# Patient Record
Sex: Male | Born: 1960
Health system: Southern US, Community
[De-identification: ages and names within clinical notes are randomized; demographics above are authoritative.]

## PROBLEM LIST (undated history)

## (undated) DIAGNOSIS — G629 Polyneuropathy, unspecified: Secondary | ICD-10-CM

## (undated) DIAGNOSIS — K219 Gastro-esophageal reflux disease without esophagitis: Secondary | ICD-10-CM

## (undated) DIAGNOSIS — E119 Type 2 diabetes mellitus without complications: Secondary | ICD-10-CM

## (undated) DIAGNOSIS — B1920 Unspecified viral hepatitis C without hepatic coma: Secondary | ICD-10-CM

## (undated) DIAGNOSIS — J40 Bronchitis, not specified as acute or chronic: Secondary | ICD-10-CM

## (undated) DIAGNOSIS — F32A Depression, unspecified: Secondary | ICD-10-CM

## (undated) DIAGNOSIS — F329 Major depressive disorder, single episode, unspecified: Secondary | ICD-10-CM

## (undated) DIAGNOSIS — K2270 Barrett's esophagus without dysplasia: Secondary | ICD-10-CM

## (undated) DIAGNOSIS — N529 Male erectile dysfunction, unspecified: Secondary | ICD-10-CM

## (undated) DIAGNOSIS — K449 Diaphragmatic hernia without obstruction or gangrene: Secondary | ICD-10-CM

## (undated) HISTORY — PX: APPENDECTOMY: SHX54

## (undated) HISTORY — DX: Polyneuropathy, unspecified: G62.9

## (undated) HISTORY — DX: Unspecified viral hepatitis C without hepatic coma: B19.20

## (undated) HISTORY — PX: TONSILLECTOMY: SUR1361

## (undated) HISTORY — DX: Barrett's esophagus without dysplasia: K22.70

## (undated) HISTORY — DX: Male erectile dysfunction, unspecified: N52.9

## (undated) HISTORY — DX: Gastro-esophageal reflux disease without esophagitis: K21.9

## (undated) HISTORY — DX: Bronchitis, not specified as acute or chronic: J40

## (undated) HISTORY — PX: COLONOSCOPY: SHX174

## (undated) HISTORY — DX: Major depressive disorder, single episode, unspecified: F32.9

## (undated) HISTORY — DX: Type 2 diabetes mellitus without complications: E11.9

## (undated) HISTORY — DX: Depression, unspecified: F32.A

## (undated) HISTORY — DX: Diaphragmatic hernia without obstruction or gangrene: K44.9

---

## 2001-05-05 ENCOUNTER — Ambulatory Visit (HOSPITAL_COMMUNITY): Admission: RE | Admit: 2001-05-05 | Discharge: 2001-05-05 | Payer: Self-pay | Admitting: Pulmonary Disease

## 2001-05-05 ENCOUNTER — Encounter: Payer: Self-pay | Admitting: Pulmonary Disease

## 2001-07-04 ENCOUNTER — Emergency Department (HOSPITAL_COMMUNITY): Admission: EM | Admit: 2001-07-04 | Discharge: 2001-07-05 | Payer: Self-pay | Admitting: Emergency Medicine

## 2002-04-14 ENCOUNTER — Emergency Department (HOSPITAL_COMMUNITY): Admission: EM | Admit: 2002-04-14 | Discharge: 2002-04-14 | Payer: Self-pay | Admitting: Emergency Medicine

## 2002-11-13 ENCOUNTER — Inpatient Hospital Stay (HOSPITAL_COMMUNITY): Admission: EM | Admit: 2002-11-13 | Discharge: 2002-11-16 | Payer: Self-pay | Admitting: Psychiatry

## 2006-07-17 ENCOUNTER — Emergency Department (HOSPITAL_COMMUNITY): Admission: EM | Admit: 2006-07-17 | Discharge: 2006-07-17 | Payer: Self-pay | Admitting: Emergency Medicine

## 2013-04-25 ENCOUNTER — Encounter: Payer: Self-pay | Admitting: Internal Medicine

## 2013-04-25 ENCOUNTER — Ambulatory Visit: Payer: Self-pay | Attending: Family Medicine | Admitting: Internal Medicine

## 2013-04-25 ENCOUNTER — Telehealth: Payer: Self-pay | Admitting: Internal Medicine

## 2013-04-25 VITALS — BP 154/96 | HR 65 | Temp 98.6°F | Ht 72.0 in | Wt 178.2 lb

## 2013-04-25 DIAGNOSIS — R109 Unspecified abdominal pain: Secondary | ICD-10-CM

## 2013-04-25 DIAGNOSIS — F17200 Nicotine dependence, unspecified, uncomplicated: Secondary | ICD-10-CM

## 2013-04-25 DIAGNOSIS — F1010 Alcohol abuse, uncomplicated: Secondary | ICD-10-CM

## 2013-04-25 DIAGNOSIS — R112 Nausea with vomiting, unspecified: Secondary | ICD-10-CM

## 2013-04-25 LAB — CBC WITH DIFFERENTIAL/PLATELET
Basophils Absolute: 0.1 10*3/uL (ref 0.0–0.1)
Basophils Relative: 1 % (ref 0–1)
Eosinophils Absolute: 0.4 10*3/uL (ref 0.0–0.7)
Eosinophils Relative: 3 % (ref 0–5)
HCT: 49.3 % (ref 39.0–52.0)
Hemoglobin: 17.2 g/dL — ABNORMAL HIGH (ref 13.0–17.0)
Lymphocytes Relative: 33 % (ref 12–46)
Lymphs Abs: 3.6 10*3/uL (ref 0.7–4.0)
MCH: 31.7 pg (ref 26.0–34.0)
MCHC: 34.9 g/dL (ref 30.0–36.0)
MCV: 90.8 fL (ref 78.0–100.0)
Monocytes Absolute: 1.1 10*3/uL — ABNORMAL HIGH (ref 0.1–1.0)
Monocytes Relative: 10 % (ref 3–12)
Neutro Abs: 5.7 10*3/uL (ref 1.7–7.7)
Neutrophils Relative %: 53 % (ref 43–77)
Platelets: 207 10*3/uL (ref 150–400)
RBC: 5.43 MIL/uL (ref 4.22–5.81)
RDW: 13.4 % (ref 11.5–15.5)
WBC: 10.8 10*3/uL — ABNORMAL HIGH (ref 4.0–10.5)

## 2013-04-25 MED ORDER — DICYCLOMINE HCL 20 MG PO TABS: 20.0000 mg | ORAL_TABLET | Freq: Four times a day (QID) | ORAL | Status: DC

## 2013-04-25 MED ORDER — ONDANSETRON HCL 4 MG PO TABS: 4.0000 mg | ORAL_TABLET | Freq: Three times a day (TID) | ORAL | Status: DC | PRN

## 2013-04-25 NOTE — Telephone Encounter (Signed)
Test note.

## 2013-04-25 NOTE — Progress Notes (Signed)
Patient ID: GURLEY CLIMER, male   DOB: [birth year], 52 y.o.   MRN: [id number]  CC: To establish care  HPI: Mr. Eckenrode is a 52 years old man here today to establish medical care. He has no significant past medical history except that he lost his job recently and because of financial distress he has been depressed. In about 2 months ago he saw a psychiatrist who prescribed a medication for depression, and since then he has been noticing frequent nausea and vomiting, and abdominal cramps as well as muscle cramps. He called his psychiatrist, he was reassured that he will be better and to continue the medication. He still has nausea/vomiting, mostly after eating and he has to relieve himself by vomiting. Vomiting as of recently ingested food or fluid, no blood or bile. He also feels some generalized cramping in the abdomen, no diarrhea or constipation. No abdominal distention. No urinary symptoms. He drinks alcohol every day, sometimes heavy. He has had 4 DUIs and is no longer eligible to driver's license as a result he cannot get a job. He continued to smoke cigarette about one pack per day sometimes half a pack.  No Known Allergies No past medical history on file. No current outpatient prescriptions on file prior to visit.   No current facility-administered medications on file prior to visit.   Family History  Problem Relation Age of Onset  . Cancer Mother   . Diabetes Mother    History   Social History  . Marital Status: Single    Spouse Name: N/A    Number of Children: N/A  . Years of Education: N/A   Occupational History  . Not on file.   Social History Main Topics  . Smoking status: Current Every Day Smoker -- 0.50 packs/day for 35 years  . Smokeless tobacco: Not on file  . Alcohol Use: Not on file  . Drug Use: Not on file  . Sexual Activity: Not on file   Other Topics Concern  . Not on file   Social History Narrative  . No narrative on file    Review of  Systems: Constitutional: Negative for fever, chills, diaphoresis, activity change, appetite change and fatigue. HENT: Negative for ear pain, nosebleeds, congestion, facial swelling, rhinorrhea, neck pain, neck stiffness and ear discharge.  Eyes: Negative for pain, discharge, redness, itching and visual disturbance. Respiratory: Negative for cough, choking, chest tightness, shortness of breath, wheezing and stridor.  Cardiovascular: Negative for chest pain, palpitations and leg swelling. Gastrointestinal: Negative for abdominal distention. Genitourinary: Negative for dysuria, urgency, frequency, hematuria, flank pain, decreased urine volume, difficulty urinating and dyspareunia.  Musculoskeletal: Negative for back pain, joint swelling, arthralgias and gait problem. Neurological: Negative for dizziness, tremors, seizures, syncope, facial asymmetry, speech difficulty, weakness, light-headedness, numbness and headaches.  Hematological: Negative for adenopathy. Does not bruise/bleed easily. Psychiatric/Behavioral: Negative for hallucinations, behavioral problems, confusion, dysphoric mood, decreased concentration and agitation.    Objective:   Filed Vitals:   04/25/13 1425  BP: 154/96  Pulse: 65  Temp: 98.6 F (37 C)    Physical Exam: Constitutional: Patient appears well-developed and well-nourished. No distress. HENT: Normocephalic, atraumatic, External right and left ear normal. Oropharynx is clear and moist.  Eyes: Conjunctivae and EOM are normal. PERRLA, no scleral icterus. Neck: Normal ROM. Neck supple. No JVD. No tracheal deviation. No thyromegaly. CVS: RRR, S1/S2 +, no murmurs, no gallops, no carotid bruit.  Pulmonary: Effort and breath sounds normal, no stridor, rhonchi, wheezes, rales.  Abdominal: Soft. BS +,  no distension, tenderness, rebound or guarding.  Musculoskeletal: Normal range of motion. No edema and no tenderness.  Lymphadenopathy: No lymphadenopathy noted, cervical,  inguinal or axillary Neuro: Alert. Normal reflexes, muscle tone coordination. No cranial nerve deficit. Skin: Skin is warm and dry. No rash noted. Not diaphoretic. No erythema. No pallor. Psychiatric: Normal mood and affect. Behavior, judgment, thought content normal.  No results found for this basename: WBC, HGB, HCT, MCV, PLT   No results found for this basename: CREATININE, BUN, NA, K, CL, CO2    No results found for this basename: HGBA1C   Lipid Panel  No results found for this basename: chol, trig, hdl, cholhdl, vldl, ldlcalc       Assessment and plan:   There are no active problems to display for this patient.  Dicyclomine 20 mg tablet by mouth q. 8 when necessary abdominal cramp Ondansetron 4 mg tablet by mouth every 6 hours when necessary nausea or vomiting  Patient extensively counseled to reduce alcohol consumption Patient counseled about smoking cessation  Labs: CBC CMP Complete urinalysis Lipid profile Amylase and lipase Thyroid function test  Patient will need to be scheduled for colonoscopy  Stasia Cavalier was given clear instructions to go to ER or return to the clinic if symptoms don't improve, worsen or new problems develop.  Lynnae January Mccorkle verbalized understanding.  Stasia Cavalier was told to call to get lab results if hasn't heard anything in the next week.         Jeanann Lewandowsky, MD [facility] [name], [place] [phone]   04/25/2013, 2:48 PM

## 2013-04-26 LAB — CMP AND LIVER
ALT: 68 U/L — ABNORMAL HIGH (ref 0–53)
AST: 36 U/L (ref 0–37)
Albumin: 4.2 g/dL (ref 3.5–5.2)
Alkaline Phosphatase: 78 U/L (ref 39–117)
BUN: 14 mg/dL (ref 6–23)
Bilirubin, Direct: 0.1 mg/dL (ref 0.0–0.3)
CO2: 27 mEq/L (ref 19–32)
Calcium: 9.3 mg/dL (ref 8.4–10.5)
Chloride: 99 mEq/L (ref 96–112)
Creat: 0.94 mg/dL (ref 0.50–1.35)
Glucose, Bld: 335 mg/dL — ABNORMAL HIGH (ref 70–99)
Indirect Bilirubin: 0.5 mg/dL (ref 0.0–0.9)
Potassium: 4.2 mEq/L (ref 3.5–5.3)
Sodium: 135 mEq/L (ref 135–145)
Total Bilirubin: 0.6 mg/dL (ref 0.3–1.2)
Total Protein: 7.7 g/dL (ref 6.0–8.3)

## 2013-04-26 LAB — URINALYSIS, COMPLETE
Bacteria, UA: NONE SEEN
Bilirubin Urine: NEGATIVE
Casts: NONE SEEN
Crystals: NONE SEEN
Glucose, UA: >1000 mg/dL — AB
Hgb urine dipstick: NEGATIVE
Ketones, ur: NEGATIVE mg/dL
Leukocytes, UA: NEGATIVE
Nitrite: NEGATIVE
Protein, ur: NEGATIVE mg/dL
Specific Gravity, Urine: >1.03 — ABNORMAL HIGH (ref 1.005–1.030)
Squamous Epithelial / HPF: NONE SEEN
Urobilinogen, UA: 0.2 mg/dL (ref 0.0–1.0)
pH: 5 (ref 5.0–8.0)

## 2013-04-26 LAB — LIPID PANEL
Cholesterol: 183 mg/dL (ref 0–200)
HDL: 33 mg/dL — ABNORMAL LOW (ref >39)
LDL Cholesterol: 88 mg/dL (ref 0–99)
Total CHOL/HDL Ratio: 5.5 Ratio
Triglycerides: 309 mg/dL — ABNORMAL HIGH (ref <150)
VLDL: 62 mg/dL — ABNORMAL HIGH (ref 0–40)

## 2013-04-26 LAB — LIPASE: Lipase: 69 U/L (ref 0–75)

## 2013-04-26 LAB — AMYLASE: Amylase: 54 U/L (ref 0–105)

## 2013-04-27 ENCOUNTER — Ambulatory Visit: Payer: Self-pay | Attending: Internal Medicine | Admitting: Internal Medicine

## 2013-04-27 VITALS — BP 151/90 | HR 87 | Temp 98.4°F | Resp 16 | Wt 180.0 lb

## 2013-04-27 DIAGNOSIS — E119 Type 2 diabetes mellitus without complications: Secondary | ICD-10-CM | POA: Insufficient documentation

## 2013-04-27 LAB — POCT GLYCOSYLATED HEMOGLOBIN (HGB A1C): Hemoglobin A1C: >=14

## 2013-04-27 MED ORDER — METFORMIN HCL 500 MG PO TABS: 500.0000 mg | ORAL_TABLET | Freq: Two times a day (BID) | ORAL | Status: DC

## 2013-04-27 NOTE — Progress Notes (Signed)
Patient here for lab results.

## 2013-04-27 NOTE — Progress Notes (Signed)
Patient ID: AERIK POLAN, male   DOB: 08-23-1961, 52 y.o.   MRN: [id number]  CC: Lab review  HPI: Mr. Haggart was called in to clinic today to review his lab results. Hypertriglyceridemia, and diabetes. He drinks about pain bottles of soda every day, and said lately he has been very thirsty, and has to drink even more. He also eats more than usual. Results of lab shows glycosuria, but no proteinuria.   No Known Allergies History reviewed. No pertinent past medical history. Current Outpatient Prescriptions on File Prior to Visit  Medication Sig Dispense Refill  . dicyclomine (BENTYL) 20 MG tablet Take 1 tablet (20 mg total) by mouth every 6 (six) hours.  30 tablet  0  . ondansetron (ZOFRAN) 4 MG tablet Take 1 tablet (4 mg total) by mouth every 8 (eight) hours as needed for nausea.  20 tablet  0   No current facility-administered medications on file prior to visit.   Family History  Problem Relation Age of Onset  . Cancer Mother   . Diabetes Mother    History   Social History  . Marital Status: Single    Spouse Name: N/A    Number of Children: N/A  . Years of Education: N/A   Occupational History  . Not on file.   Social History Main Topics  . Smoking status: Current Every Day Smoker -- 0.50 packs/day for 35 years  . Smokeless tobacco: Not on file  . Alcohol Use: Not on file  . Drug Use: Not on file  . Sexual Activity: Not on file   Other Topics Concern  . Not on file   Social History Narrative  . No narrative on file    Review of Systems: Constitutional: Negative for fever, chills, diaphoresis, activity change, appetite change and fatigue. HENT: Negative for ear pain, nosebleeds, congestion, facial swelling, rhinorrhea, neck pain, neck stiffness and ear discharge.  Eyes: Negative for pain, discharge, redness, itching and visual disturbance. Respiratory: Negative for cough, choking, chest tightness, shortness of breath, wheezing and stridor.  Cardiovascular: Negative  for chest pain, palpitations and leg swelling. Gastrointestinal: Negative for abdominal distention. Genitourinary: Negative for dysuria, urgency, frequency, hematuria, flank pain, decreased urine volume, difficulty urinating and dyspareunia.  Musculoskeletal: Negative for back pain, joint swelling, arthralgias and gait problem. Neurological: Negative for dizziness, tremors, seizures, syncope, facial asymmetry, speech difficulty, weakness, light-headedness, numbness and headaches.  Hematological: Negative for adenopathy. Does not bruise/bleed easily. Psychiatric/Behavioral: Negative for hallucinations, behavioral problems, confusion, dysphoric mood, decreased concentration and agitation.    Objective:   Filed Vitals:   04/27/13 1533  BP: 151/90  Pulse: 87  Temp: 98.4 F (36.9 C)  Resp: 16    Physical Exam: Constitutional: Patient appears well-developed and well-nourished. No distress. HENT: Normocephalic, atraumatic, External right and left ear normal. Oropharynx is clear and moist.  Eyes: Conjunctivae and EOM are normal. PERRLA, no scleral icterus. Neck: Normal ROM. Neck supple. No JVD. No tracheal deviation. No thyromegaly. CVS: RRR, S1/S2 +, no murmurs, no gallops, no carotid bruit.  Pulmonary: Effort and breath sounds normal, no stridor, rhonchi, wheezes, rales.  Abdominal: Soft. BS +,  no distension, tenderness, rebound or guarding.  Musculoskeletal: Normal range of motion. No edema and no tenderness.  Lymphadenopathy: No lymphadenopathy noted, cervical, inguinal or axillary Neuro: Alert. Normal reflexes, muscle tone coordination. No cranial nerve deficit. Skin: Skin is warm and dry. No rash noted. Not diaphoretic. No erythema. No pallor. Psychiatric: Normal mood and affect. Behavior, judgment, thought content  normal.  Lab Results  Component Value Date   WBC 10.8* 04/25/2013   HGB 17.2* 04/25/2013   HCT 49.3 04/25/2013   MCV 90.8 04/25/2013   PLT 207 04/25/2013   Lab Results   Component Value Date   CREATININE 0.94 04/25/2013   BUN 14 04/25/2013   NA 135 04/25/2013   K 4.2 04/25/2013   CL 99 04/25/2013   CO2 27 04/25/2013    No results found for this basename: HGBA1C   Lipid Panel     Component Value Date/Time   CHOL 183 04/25/2013 1446   TRIG 309* 04/25/2013 1446   HDL 33* 04/25/2013 1446   CHOLHDL 5.5 04/25/2013 1446   VLDL 62* 04/25/2013 1446   LDLCALC 88 04/25/2013 1446       Assessment and plan:   Patient Active Problem List   Diagnosis Date Noted  . Diabetes 04/27/2013  Will check hemoglobin A1c today Metformin 500 mg tablet by mouth twice a day prescribed  Patient was extensively counseled about diabetes, hypoglycemic precaution, and diet and exercise Return in 2 weeks for blood sugar check and blood pressure check  Stasia Cavalier was given clear instructions to go to ER or return to the clinic if symptoms don't improve, worsen or new problems develop.  Lynnae January Zelenak verbalized understanding.  Stasia Cavalier was told to call to get lab results if hasn't heard anything in the next week.        Jeanann Lewandowsky, MD [facility] [place], [place] [phone]   04/27/2013, 4:06 PM

## 2013-04-28 ENCOUNTER — Encounter: Payer: Self-pay | Admitting: Internal Medicine

## 2013-05-03 ENCOUNTER — Ambulatory Visit: Payer: Self-pay

## 2013-05-26 ENCOUNTER — Ambulatory Visit: Payer: Self-pay | Attending: Internal Medicine | Admitting: Internal Medicine

## 2013-05-26 VITALS — BP 159/98 | HR 76 | Temp 98.1°F | Ht 72.0 in | Wt 181.6 lb

## 2013-05-26 DIAGNOSIS — I10 Essential (primary) hypertension: Secondary | ICD-10-CM | POA: Insufficient documentation

## 2013-05-26 DIAGNOSIS — E119 Type 2 diabetes mellitus without complications: Secondary | ICD-10-CM

## 2013-05-26 LAB — COMPREHENSIVE METABOLIC PANEL
ALT: 84 U/L — ABNORMAL HIGH (ref 0–53)
AST: 57 U/L — ABNORMAL HIGH (ref 0–37)
Albumin: 4.4 g/dL (ref 3.5–5.2)
Alkaline Phosphatase: 71 U/L (ref 39–117)
Calcium: 9.4 mg/dL (ref 8.4–10.5)
Chloride: 100 mEq/L (ref 96–112)
Potassium: 4.3 mEq/L (ref 3.5–5.3)
Sodium: 135 mEq/L (ref 135–145)
Total Protein: 8 g/dL (ref 6.0–8.3)

## 2013-05-26 MED ORDER — METFORMIN HCL 500 MG PO TABS: 500.0000 mg | ORAL_TABLET | Freq: Two times a day (BID) | ORAL | Status: DC

## 2013-05-26 MED ORDER — METFORMIN HCL 1000 MG PO TABS: 1000.0000 mg | ORAL_TABLET | Freq: Two times a day (BID) | ORAL | Status: DC

## 2013-05-26 MED ORDER — LISINOPRIL-HYDROCHLOROTHIAZIDE 20-25 MG PO TABS: 1.0000 | ORAL_TABLET | Freq: Every day | ORAL | Status: DC

## 2013-05-26 NOTE — Patient Instructions (Addendum)

## 2013-05-30 NOTE — Progress Notes (Signed)
Patient ID: [name] [name], male   DOB: 01-11-1961, 52 y.o.   MRN: [id number]  CC: Followup  HPI: Patient is 52 year old male with recent diagnosis of diabetes, presents to clinic for followup. He reports compliance with metformin and reports checking his sugar levels regularly. He explains that sugars are ranging from 100-180 prior to meals. He denies chest pain or shortness of breath, no specific abdominal or urinary concerns, no fevers and chills, no specific focal neurological symptoms.  No Known Allergies Past medical history of diabetes and hypertension Current Outpatient Prescriptions on File Prior to Visit  Medication Sig Dispense Refill  . dicyclomine (BENTYL) 20 MG tablet Take 1 tablet (20 mg total) by mouth every 6 (six) hours.  30 tablet  0  . ondansetron (ZOFRAN) 4 MG tablet Take 1 tablet (4 mg total) by mouth every 8 (eight) hours as needed for nausea.  20 tablet  0   No current facility-administered medications on file prior to visit.   Family History  Problem Relation Age of Onset  . Cancer Mother   . Diabetes Mother    History   Social History  . Marital Status: Single    Spouse Name: N/A    Number of Children: N/A  . Years of Education: N/A   Occupational History  . Not on file.   Social History Main Topics  . Smoking status: Current Every Day Smoker -- 0.50 packs/day for 35 years  . Smokeless tobacco: Not on file  . Alcohol Use: Not on file  . Drug Use: Not on file  . Sexual Activity: Not on file   Other Topics Concern  . Not on file   Social History Narrative  . No narrative on file    Review of Systems  Constitutional: Negative for fever, chills, diaphoresis, activity change, appetite change and fatigue.  HENT: Negative for ear pain, nosebleeds, congestion, facial swelling, rhinorrhea, neck pain, neck stiffness and ear discharge.   Eyes: Negative for pain, discharge, redness, itching and visual disturbance.  Respiratory: Negative for cough,  choking, chest tightness, shortness of breath, wheezing and stridor.   Cardiovascular: Negative for chest pain, palpitations and leg swelling.  Gastrointestinal: Negative for abdominal distention.  Genitourinary: Negative for dysuria, urgency, frequency, hematuria, flank pain, decreased urine volume, difficulty urinating and dyspareunia.  Musculoskeletal: Negative for back pain, joint swelling, arthralgias and gait problem.  Neurological: Negative for dizziness, tremors, seizures, syncope, facial asymmetry, speech difficulty, weakness, light-headedness, numbness and headaches.  Hematological: Negative for adenopathy. Does not bruise/bleed easily.  Psychiatric/Behavioral: Negative for hallucinations, behavioral problems, confusion, dysphoric mood, decreased concentration and agitation.    Objective:   Filed Vitals:   05/26/13 1417  BP: 159/98  Pulse: 76  Temp: 98.1 F (36.7 C)    Physical Exam  Constitutional: Appears well-developed and well-nourished. No distress.  CVS: RRR, S1/S2 +, no murmurs, no gallops, no carotid bruit.  Pulmonary: Effort and breath sounds normal, no stridor, rhonchi, wheezes, rales.  Abdominal: Soft. BS +,  no distension, tenderness, rebound or guarding.   Lab Results  Component Value Date   WBC 10.8* 04/25/2013   HGB 17.2* 04/25/2013   HCT 49.3 04/25/2013   MCV 90.8 04/25/2013   PLT 207 04/25/2013   Lab Results  Component Value Date   CREATININE 0.81 05/26/2013   BUN 13 05/26/2013   NA 135 05/26/2013   K 4.3 05/26/2013   CL 100 05/26/2013   CO2 26 05/26/2013    Lab Results  Component Value Date  HGBA1C =>14% 04/27/2013   Lipid Panel     Component Value Date/Time   CHOL 183 04/25/2013 1446   TRIG 309* 04/25/2013 1446   HDL 33* 04/25/2013 1446   CHOLHDL 5.5 04/25/2013 1446   VLDL 62* 04/25/2013 1446   LDLCALC 88 04/25/2013 1446       Assessment and plan:   Patient Active Problem List   Diagnosis Date Noted  . Diabetes 04/27/2013   - Recently  diagnosed with A1c greater than 14. I have discussed with patient meaning of A1c in detail and he has verbalized understanding - Patient is reluctant to start insulin despite my strong encouragement and recommendation to initiate long-acting insulin such as Lantus - Patient was to continue metformin only for now and we have discussed importance of A1c check November 2014 to make sure that A1c is trending down - I have also advised patient to continue checking her sugar levels regularly and to call us back if the numbers are persistently higher than 257 that we can readjust her regimen.  Hypertension - I have discussed goal blood pressure ranged of less than 130/90 - Patient reluctant to start blood pressure medication and wants to monitor with conservative management for now - Strong recommendation for initiation of antihypertensive regimen provided - Patient agreed on dietary restrictions and exercise to see if that can help lower blood pressure and control sugar level - Followup recommended in 2 months

## 2013-06-01 ENCOUNTER — Ambulatory Visit: Payer: Self-pay

## 2013-06-21 ENCOUNTER — Ambulatory Visit: Payer: Self-pay

## 2013-06-22 ENCOUNTER — Ambulatory Visit: Payer: Self-pay | Attending: Internal Medicine

## 2013-06-22 ENCOUNTER — Ambulatory Visit: Payer: Self-pay | Attending: Internal Medicine | Admitting: Internal Medicine

## 2013-06-22 ENCOUNTER — Other Ambulatory Visit: Payer: Self-pay | Admitting: Internal Medicine

## 2013-06-22 VITALS — BP 140/80 | HR 76 | Temp 98.2°F | Resp 17

## 2013-06-22 DIAGNOSIS — E119 Type 2 diabetes mellitus without complications: Secondary | ICD-10-CM | POA: Insufficient documentation

## 2013-06-22 LAB — COMPREHENSIVE METABOLIC PANEL
ALT: 79 U/L — ABNORMAL HIGH (ref 0–53)
AST: 55 U/L — ABNORMAL HIGH (ref 0–37)
BUN: 11 mg/dL (ref 6–23)
Calcium: 10.3 mg/dL (ref 8.4–10.5)
Chloride: 101 mEq/L (ref 96–112)
Creat: 0.74 mg/dL (ref 0.50–1.35)
Total Bilirubin: 0.8 mg/dL (ref 0.3–1.2)

## 2013-06-22 NOTE — Progress Notes (Signed)
Patient here for follow up DM Needs A1C checked

## 2013-06-22 NOTE — Progress Notes (Signed)
Patient ID: [name] [name], male   DOB: December 23, 1960, 52 y.o.   MRN: [id number]   CC: Followup  HPI: Patient is 52 year old male who presents to clinic for followup, was to have A1c checked to ensure that he continues to trend down. He reports compliance with exercise and dietary restrictions. He reports checking sugar levels regularly and the numbers are usually less than 150. He denies chest pain or shortness of breath, no recent sicknesses or hospitalizations.  No Known Allergies History reviewed. No pertinent past medical history. Current Outpatient Prescriptions on File Prior to Visit  Medication Sig Dispense Refill  . dicyclomine (BENTYL) 20 MG tablet Take 1 tablet (20 mg total) by mouth every 6 (six) hours.  30 tablet  0  . metFORMIN (GLUCOPHAGE) 500 MG tablet Take 1 tablet (500 mg total) by mouth 2 (two) times daily with a meal.  60 tablet  3  . ondansetron (ZOFRAN) 4 MG tablet Take 1 tablet (4 mg total) by mouth every 8 (eight) hours as needed for nausea.  20 tablet  0   No current facility-administered medications on file prior to visit.   Family History  Problem Relation Age of Onset  . Cancer Mother   . Diabetes Mother    History   Social History  . Marital Status: Single    Spouse Name: N/A    Number of Children: N/A  . Years of Education: N/A   Occupational History  . Not on file.   Social History Main Topics  . Smoking status: Current Every Day Smoker -- 0.50 packs/day for 35 years  . Smokeless tobacco: Not on file  . Alcohol Use: Not on file  . Drug Use: Not on file  . Sexual Activity: Not on file   Other Topics Concern  . Not on file   Social History Narrative  . No narrative on file    Review of Systems  Constitutional: Negative for fever, chills, diaphoresis, activity change, appetite change and fatigue.  HENT: Negative for ear pain, nosebleeds, congestion, facial swelling, rhinorrhea, neck pain, neck stiffness and ear discharge.   Eyes: Negative for  pain, discharge, redness, itching and visual disturbance.  Respiratory: Negative for cough, choking, chest tightness, shortness of breath, wheezing and stridor.   Cardiovascular: Negative for chest pain, palpitations and leg swelling.  Gastrointestinal: Negative for abdominal distention.  Genitourinary: Negative for dysuria, urgency, frequency, hematuria, flank pain, decreased urine volume, difficulty urinating and dyspareunia.  Musculoskeletal: Negative for back pain, joint swelling, arthralgias and gait problem.  Neurological: Negative for dizziness, tremors, seizures, syncope, facial asymmetry, speech difficulty, weakness, light-headedness, numbness and headaches.  Hematological: Negative for adenopathy. Does not bruise/bleed easily.  Psychiatric/Behavioral: Negative for hallucinations, behavioral problems, confusion, dysphoric mood, decreased concentration and agitation.    Objective:   Filed Vitals:   06/22/13 1150  BP: 140/80  Pulse: 76  Temp: 98.2 F (36.8 C)  Resp: 17    Physical Exam  Constitutional: Appears well-developed and well-nourished. No distress.  HENT: Normocephalic. External right and left ear normal. Oropharynx is clear and moist.  Eyes: Conjunctivae and EOM are normal. PERRLA, no scleral icterus.  Neck: Normal ROM. Neck supple. No JVD. No tracheal deviation. No thyromegaly.  CVS: RRR, S1/S2 +, no murmurs, no gallops, no carotid bruit.  Pulmonary: Effort and breath sounds normal, no stridor, rhonchi, wheezes, rales.  Abdominal: Soft. BS +,  no distension, tenderness, rebound or guarding.  Musculoskeletal: Normal range of motion. No edema and no tenderness.  Lymphadenopathy:  No lymphadenopathy noted, cervical, inguinal. Neuro: Alert. Normal reflexes, muscle tone coordination. No cranial nerve deficit. Skin: Skin is warm and dry. No rash noted. Not diaphoretic. No erythema. No pallor.  Psychiatric: Normal mood and affect. Behavior, judgment, thought content  normal.   Lab Results  Component Value Date   WBC 10.8* 04/25/2013   HGB 17.2* 04/25/2013   HCT 49.3 04/25/2013   MCV 90.8 04/25/2013   PLT 207 04/25/2013   Lab Results  Component Value Date   CREATININE 0.81 05/26/2013   BUN 13 05/26/2013   NA 135 05/26/2013   K 4.3 05/26/2013   CL 100 05/26/2013   CO2 26 05/26/2013    Lab Results  Component Value Date   HGBA1C =>14% 04/27/2013   Lipid Panel     Component Value Date/Time   CHOL 183 04/25/2013 1446   TRIG 309* 04/25/2013 1446   HDL 33* 04/25/2013 1446   CHOLHDL 5.5 04/25/2013 1446   VLDL 62* 04/25/2013 1446   LDLCALC 88 04/25/2013 1446       Assessment and plan:   Patient Active Problem List   Diagnosis Date Noted  . Diabetes 04/27/2013   - Hemoglobin A1c dropped from 14-9. This is significant improvement. We have discussed continuation of prescribed dietary restrictions and exercise - continuing metformin at the same does and frequency

## 2013-06-22 NOTE — Patient Instructions (Signed)
Diabetic Neuropathy  Diabetic neuropathy is a common complication caused by diabetes. Neuropathy is a term that means nerve disease or damage. If your diabetes is uncontrolled and you have high blood glucose (sugar) levels, over time, this can lead to damage to nerves throughout your body. There are three types of diabetic neuropathy:    Peripheral.   Autonomic.   Focal.  PERIPHERAL NEUROPATHY  Peripheral neuropathy is the most common form of diabetic neuropathy. It causes damage to the nerves of the feet and legs and eventually the hands and arms.   SYMPTOMS   Peripheral neuropathy occurs slowly over time. The peripheral nerves sense touch, hot and cold, and pain. When these nerves no longer work:    Your feet become numb.   You can no longer feel pressure or pain in your feet.   You may have burning, stabbing or aching pain.  This can lead to:   Thick calluses over pressure areas.   Pressure sores.   Ulcers. Ulcers can become infected with germs (bacteria) and can even lead to infection in the bones of the feet.  DIAGNOSIS   The diagnosis of diabetic neuropathy is difficult at best. Sensory function testing can be done with:   Light touch using a monofilament.   Vibration with tuning fork.   Sharp sensation with pin prick  Other tests that can help diagnose neuropathy are:   Nerve Conduction Velocities (NCV). This checks the transmission of electrical current through a nerve.   Electromyography (EMG). This shows how muscles respond to electrical signals transmitted by nearby nerves.   Quantitative sensory testing, which is used to assess how your nerves respond to vibration and changes in temperature.  AUTONOMIC NEUROPATHY  The autonomic nervous system controls functions that you do not think about. Examples would be:    Heart beat.   Regulation of body temperature.   Blood pressure.   Urination.   Digestion.   Sweating.   Sexual function.  SYMPTOMS    The symptoms of autonomic neuropathy vary depending on which nerves are affected.    There can be problems with digestion such as:   Feeling sick to your stomach (nausea).   Vomiting.   Bloating.   Constipation.   Diarrhea.   Abdominal pain.   Difficulty with urination may occur because of the inability to sense when your bladder is full. You may have urine leakage (incontinence) or inability to empty your bladder completely (retention).   Palpitations or a feeling of an abnormal heart beat.   Blood pressure drops on arising (orthostatic hypotension). This can happen when you first sit up or stand up. It causes you to feel:   Dizzy.   Weak.   Faint.   Sexual functioning:   In men, inability to attain and maintain an erection.   In women, vaginal dryness and problems with decreased sexual desire and arousal.  DIAGNOSIS   Diagnosis is often based on reported symptoms. Tell your medical caregiver if you experience:    Dizziness.   Constipation.   Diarrhea.   Inappropriate urination or inability to urinate.   Inability to get or maintain an erection.  Tests that may be done include:   An EKG or Holter Monitor. These are tests that can help show problems with the heart rate or heart rhythm.   X-rays can be used to find if there are problems with your ability to properly empty food from your stomach into the small intestine after eating.  FOCAL   NEUROPATHY  Focal neuropathy affects just one nerve tract and occurs suddenly. However, it usually improves by itself over time. It does not cause long term damage, and treatments are usually needed only until the problem improves.  SYMPTOMS   Examples include:    Abnormal eye movements or abnormal alignment of both eyes.   Weakness in the wrist.   Foot drop, which results in inability to lift the foot properly. This causes abnormal walking or foot movement.  DIAGNOSIS    Diagnosis is made based on your symptoms and what your caregiver finds on your exam. Other tests that may be done include:   Nerve Conduction Velocities (NCV). This checks the transmission of electrical current through a nerve.   Electromyography (EMG). This shows how muscles respond to electrical signals transmitted by nearby nerves.   Quantitative sensory testing, which is used to assess how your nerves respond to vibration and changes in temperature.  TREATMENT  Once nerve damage occurs it cannot be reversed. The goal of treatment is to keep the disease from getting worse. If it gets worse, it will affect more nerve fibers. Controlling your blood (sugar) is the key. You will need to keep your blood glucose and A1c at the target range prescribed by your caregiver. Things that will help control blood glucose levels include:   Blood glucose monitoring.   Meal planning.   Physical activity.   Diabetes medication.  Over time, maintaining lower blood glucose levels helps lessen symptoms.  Sometimes, prescription pain medicine is needed. Focal neuropathy can be painful and unpredictable and occurs most often in older adults with diabetes.   SEEK MEDICAL CARE IF:    You develop peripheral nerve symptoms such as burning, numbness, or pain in your feet, legs or hands.   You develop autonomic nerve symptoms such as:   Dizziness.   Abnormal urinary control.   Inability to get an erection.   You develop focal nerve symptoms such as sudden abnormal eye movements or sudden foot drop.  Document Released: 10/26/2001 Document Revised: 11/09/2011 Document Reviewed: 01/25/2009  [employer] Patient Information 2014 [employer].

## 2013-06-29 ENCOUNTER — Ambulatory Visit: Payer: Self-pay

## 2013-07-05 ENCOUNTER — Ambulatory Visit: Payer: Self-pay

## 2013-07-05 ENCOUNTER — Ambulatory Visit: Payer: Self-pay | Attending: Internal Medicine

## 2013-07-05 MED ORDER — TRAMADOL HCL 50 MG PO TABS: 50.0000 mg | ORAL_TABLET | Freq: Three times a day (TID) | ORAL | Status: DC | PRN

## 2013-07-05 NOTE — Progress Notes (Unsigned)
  Subjective:    Patient ID: [name] [name], male    DOB: Dec 16, 1960, 52 y.o.   MRN: [id number]  HPI    Review of Systems     Objective:   Physical Exam        Assessment & Plan:  Pt here with c/o bilat shoulder pain x 1 mnth with worsening at night. Taking ibuprofen otc,not working Vss.no swelling

## 2013-07-05 NOTE — Progress Notes (Unsigned)
Pt given #30 day supply Tramadol 50 mg tab until next appt 08/03/13 per dr. Hyman Hopes

## 2013-07-06 ENCOUNTER — Other Ambulatory Visit: Payer: Self-pay

## 2013-08-03 ENCOUNTER — Encounter: Payer: Self-pay | Admitting: Internal Medicine

## 2013-08-03 ENCOUNTER — Ambulatory Visit: Payer: Self-pay | Attending: Internal Medicine

## 2013-08-03 ENCOUNTER — Ambulatory Visit: Payer: Self-pay | Attending: Internal Medicine | Admitting: Internal Medicine

## 2013-08-03 VITALS — BP 154/100 | HR 74 | Temp 99.2°F | Resp 16 | Ht 72.0 in | Wt 179.0 lb

## 2013-08-03 DIAGNOSIS — F17200 Nicotine dependence, unspecified, uncomplicated: Secondary | ICD-10-CM | POA: Insufficient documentation

## 2013-08-03 DIAGNOSIS — E119 Type 2 diabetes mellitus without complications: Secondary | ICD-10-CM | POA: Insufficient documentation

## 2013-08-03 MED ORDER — TRAMADOL HCL 50 MG PO TABS: 50.0000 mg | ORAL_TABLET | Freq: Three times a day (TID) | ORAL | Status: DC | PRN

## 2013-08-03 MED ORDER — DAPAGLIFLOZIN PROPANEDIOL 10 MG PO TABS: 10.0000 mg | ORAL_TABLET | Freq: Every day | ORAL | Status: DC

## 2013-08-03 NOTE — Progress Notes (Signed)
My is here wanting to review his medications. Since he started taking the metformin he has gotten sick with muscle aches.

## 2013-08-03 NOTE — Patient Instructions (Signed)
Diabetes and Exercise Exercising regularly is important. It is not just about losing weight. It has many health benefits, such as:  Improving your overall fitness, flexibility, and endurance.  Increasing your bone density.  Helping with weight control.  Decreasing your body fat.  Increasing your muscle strength.  Reducing stress and tension.  Improving your overall health. People with diabetes who exercise gain additional benefits because exercise:  Reduces appetite.  Improves the body's use of blood sugar (glucose).  Helps lower or control blood glucose.  Decreases blood pressure.  Helps control blood lipids (such as cholesterol and triglycerides).  Improves the body's use of the hormone insulin by:  Increasing the body's insulin sensitivity.  Reducing the body's insulin needs.  Decreases the risk for heart disease because exercising:  Lowers cholesterol and triglycerides levels.  Increases the levels of good cholesterol (such as high-density lipoproteins [HDL]) in the body.  Lowers blood glucose levels. YOUR ACTIVITY PLAN  Choose an activity that you enjoy and set realistic goals. Your health care provider or diabetes educator can help you make an activity plan that works for you. You can break activities into 2 or 3 sessions throughout the day. Doing so is as good as one long session. Exercise ideas include:  Taking the dog for a walk.  Taking the stairs instead of the elevator.  Dancing to your favorite song.  Doing your favorite exercise with a friend. RECOMMENDATIONS FOR EXERCISING WITH TYPE 1 OR TYPE 2 DIABETES   Check your blood glucose before exercising. If blood glucose levels are greater than 240 mg/dL, check for urine ketones. Do not exercise if ketones are present.  Avoid injecting insulin into areas of the body that are going to be exercised. For example, avoid injecting insulin into:  The arms when playing tennis.  The legs when  jogging.  Keep a record of:  Food intake before and after you exercise.  Expected peak times of insulin action.  Blood glucose levels before and after you exercise.  The type and amount of exercise you have done.  Review your records with your health care provider. Your health care provider will help you to develop guidelines for adjusting food intake and insulin amounts before and after exercising.  If you take insulin or oral hypoglycemic agents, watch for signs and symptoms of hypoglycemia. They include:  Dizziness.  Shaking.  Sweating.  Chills.  Confusion.  Drink plenty of water while you exercise to prevent dehydration or heat stroke. Body water is lost during exercise and must be replaced.  Talk to your health care provider before starting an exercise program to make sure it is safe for you. Remember, almost any type of activity is better than none. Document Released: 11/07/2003 Document Revised: 04/19/2013 Document Reviewed: 01/24/2013 [facility] Patient Information 2014 [place], [place]. Dapagliflozin tablets What is this medicine? DAPAGLIFLOZIN (DAP a gli FLOE zin) helps to treat type 2 diabetes. It helps to control blood sugar. Treatment is combined with diet and exercise. This medicine may be used for other purposes; ask your health care provider or pharmacist if you have questions. COMMON BRAND NAME(S): FARXIGA What should I tell my health care provider before I take this medicine? They need to know if you have any of these conditions: -bladder cancer -dehydration -diabetic ketoacidosis -diet low in salt -high cholesterol -history of yeast infection of the penis or vagina -kidney disease -low blood pressure -on hemodialysis -type 1 diabetes -uncircumcised male -an unusual or allergic reaction to dapagliflozin, other medicines,  foods, dyes, or preservatives -pregnant or trying to get pregnant -breast-feeding How should I use this medicine? Take this  medicine by mouth with a glass of water. Follow the directions on the prescription label. You can take it with or without food. If it upsets your stomach, take it with food. Take this medicine in the morning. Take your dose at the same time each day. Do not take more often than directed. Do not stop taking except on your doctor's advice. A special MedGuide will be given to you by the pharmacist with each prescription and refill. Be sure to read this information carefully each time. Talk to your pediatrician regarding the use of this medicine in children. Special care may be needed. Overdosage: If you think you've taken too much of this medicine contact a poison control center or emergency room at once. Overdosage: If you think you have taken too much of this medicine contact a poison control center or emergency room at once. NOTE: This medicine is only for you. Do not share this medicine with others. What if I miss a dose? If you miss a dose, take it as soon as you can. If it is almost time for your next dose, take only that dose. Do not take double or extra doses. What may interact with this medicine? Do not take this medicine with any of the following medications: -gatifloxacinThis medicine may also interact with the following medications: -alcohol -certain medicines for blood pressure, heart disease -diuretics -insulin -nateglinide -pioglitazone -quinolone antibiotics like ciprofloxacin, levofloxacin, ofloxacin -repaglinide -some herbal dietary supplements -steroid medicines like prednisone or cortisone -sulfonylureas like glimepiride, glipizide, glyburide -thyroid medicine This list may not describe all possible interactions. Give your health care provider a list of all the medicines, herbs, non-prescription drugs, or dietary supplements you use. Also tell them if you smoke, drink alcohol, or use illegal drugs. Some items may interact with your medicine. What should I watch for while  using this medicine? Visit your doctor or health care professional for regular checks on your progress. A test called the HbA1C (A1C) will be monitored. This is a simple blood test. It measures your blood sugar control over the last 2 to 3 months. You will receive this test every 3 to 6 months. Learn how to check your blood sugar. Learn the symptoms of low and high blood sugar and how to manage them. Always carry a quick-source of sugar with you in case you have symptoms of low blood sugar. Examples include hard sugar candy or glucose tablets. Make sure others know that you can choke if you eat or drink when you develop serious symptoms of low blood sugar, such as seizures or unconsciousness. They must get medical help at once. Tell your doctor or health care professional if you have high blood sugar. You might need to change the dose of your medicine. If you are sick or exercising more than usual, you might need to change the dose of your medicine. Do not skip meals. Ask your doctor or health care professional if you should avoid alcohol. Many nonprescription cough and cold products contain sugar or alcohol. These can affect blood sugar. Wear a medical ID bracelet or chain, and carry a card that describes your disease and details of your medicine and dosage times. What side effects may I notice from receiving this medicine? Side effects that you should report to your doctor or health care professional as soon as possible: -allergic reactions like skin rash, itching or hives,  swelling of the face, lips, or tongue -breathing problems -dizziness -feeling faint or lightheaded, falls -fever, chills -muscle weakness -signs and symptoms of low blood sugar such as feeling anxious, confusion, dizziness, increased hunger, unusually weak or tired, sweating, shakiness, cold, irritable, headache, blurred vision, fast heartbeat, loss of consciousness -trouble passing urine or change in the amount of  urine -penile discharge, itching, or pain in men -vaginal discharge, itching, or odor in women  Side effects that usually do not require medical attention (Report these to your doctor or health care professional if they continue or are bothersome.): -constipation -nausea -increased urination -stuffy or runny nose -sore throat -thirsty This list may not describe all possible side effects. Call your doctor for medical advice about side effects. You may report side effects to FDA at 1-800-FDA-1088. Where should I keep my medicine? Keep out of the reach of children. Store at room temperature between 15 and 30 degrees C (59 and 86 degrees F). Throw away any unused medicine after the expiration date. NOTE: This sheet is a summary. It may not cover all possible information. If you have questions about this medicine, talk to your doctor, pharmacist, or health care provider.  2014, Elsevier/[employer] Standard. (2012-11-30 14:21:29)

## 2013-08-04 NOTE — Progress Notes (Signed)
Patient ID: [name] [name], male   DOB: 1961-06-07, 52 y.o.   MRN: [id number] Patient Demographics  [name] [name], is a 52 y.o. male  [id number]  [id number]  DOB - [birth year]  Chief Complaint  Patient presents with  . Follow-up        Subjective:   [name] [name] is a 52 y.o. male here today for a follow up visit. Patient is complaining about the side effects of metformin including general body pain and leg pain. He decided not to take the medication one day and he felt better, when he started taking it again pain resumed. He would like to try other medications. His last hemoglobin A1c was now almost by 6 points from the time of diagnosis. History well with nutrition, compliant with medications but continues to smoke cigarette. Patient has No headache, No chest pain, No abdominal pain - No Nausea, No new weakness tingling or numbness, No Cough - SOB.  ALLERGIES: No Known Allergies  PAST MEDICAL HISTORY: Past Medical History  Diagnosis Date  . Depression     MEDICATIONS AT HOME: Prior to Admission medications   Medication Sig Start Date End Date Taking? Authorizing Provider  Dapagliflozin Propanediol 10 MG TABS Take 10 mg by mouth daily. 08/03/13   Jeanann Lewandowsky, MD  dicyclomine (BENTYL) 20 MG tablet Take 1 tablet (20 mg total) by mouth every 6 (six) hours. 04/25/13   Jeanann Lewandowsky, MD  ondansetron (ZOFRAN) 4 MG tablet Take 1 tablet (4 mg total) by mouth every 8 (eight) hours as needed for nausea. 04/25/13   Jeanann Lewandowsky, MD  traMADol (ULTRAM) 50 MG tablet Take 1 tablet (50 mg total) by mouth every 8 (eight) hours as needed. 08/03/13   Jeanann Lewandowsky, MD     Objective:   Filed Vitals:   08/03/13 1524  BP: 154/100  Pulse: 74  Temp: 99.2 F (37.3 C)  TempSrc: Oral  Resp: 16  Height: 6' (1.829 m)  Weight: 179 lb (81.194 kg)  SpO2: 98%    Exam General appearance : Awake, alert, not in any distress. Speech Clear. Not toxic looking HEENT: Atraumatic  and Normocephalic, pupils equally reactive to light and accomodation Neck: supple, no JVD. No cervical lymphadenopathy.  Chest:Good air entry bilaterally, no added sounds  CVS: S1 S2 regular, no murmurs.  Abdomen: Bowel sounds present, Non tender and not distended with no gaurding, rigidity or rebound. Extremities: B/L Lower Ext shows no edema, both legs are warm to touch Neurology: Awake alert, and oriented X 3, CN II-XII intact, Non focal Skin:No Rash Wounds:N/A   Data Review   CBC No results found for this basename: WBC, HGB, HCT, PLT, MCV, MCH, MCHC, RDW, NEUTRABS, LYMPHSABS, MONOABS, EOSABS, BASOSABS, BANDABS, BANDSABD,  in the last 168 hours  Chemistries   No results found for this basename: NA, K, CL, CO2, GLUCOSE, BUN, CREATININE, GFRCGP, CALCIUM, MG, AST, ALT, ALKPHOS, BILITOT,  in the last 168 hours ------------------------------------------------------------------------------------------------------------------ No results found for this basename: HGBA1C,  in the last 72 hours ------------------------------------------------------------------------------------------------------------------ No results found for this basename: CHOL, HDL, LDLCALC, TRIG, CHOLHDL, LDLDIRECT,  in the last 72 hours ------------------------------------------------------------------------------------------------------------------ No results found for this basename: TSH, T4TOTAL, FREET3, T3FREE, THYROIDAB,  in the last 72 hours ------------------------------------------------------------------------------------------------------------------ No results found for this basename: VITAMINB12, FOLATE, FERRITIN, TIBC, IRON, RETICCTPCT,  in the last 72 hours  Coagulation profile  No results found for this basename: INR, PROTIME,  in the last 168 hours    Assessment & Plan   1.  Diabetes with metformin side effect Will discontinue metformin - traMADol (ULTRAM) 50 MG tablet; Take 1 tablet (50 mg total) by  mouth every 8 (eight) hours as needed.  Dispense: 30 tablet; Refill: 0 - Dapagliflozin Propanediol 10 MG TABS; Take 10 mg by mouth daily.  Dispense: 30 tablet; Refill: 3  2. Tobacco use disorder Patient extensively counseled about nutrition and exercise Patient was counseled about smoking cessation   Follow up in 3 months or when necessary   The patient was given clear instructions to go to ER or return to medical center if symptoms don't improve, worsen or new problems develop. The patient verbalized understanding. The patient was told to call to get lab results if they haven't heard anything in the next week.    Jeanann Lewandowsky, MD, MHA, FACP, FAAP [facility] [place], [place] [phone]   08/04/2013, 11:01 AM

## 2013-09-28 ENCOUNTER — Ambulatory Visit: Payer: Self-pay | Attending: Internal Medicine | Admitting: Internal Medicine

## 2013-09-28 ENCOUNTER — Encounter: Payer: Self-pay | Admitting: Internal Medicine

## 2013-09-28 VITALS — Ht 71.0 in | Wt 175.0 lb

## 2013-09-28 DIAGNOSIS — R109 Unspecified abdominal pain: Secondary | ICD-10-CM | POA: Insufficient documentation

## 2013-09-28 DIAGNOSIS — E119 Type 2 diabetes mellitus without complications: Secondary | ICD-10-CM | POA: Insufficient documentation

## 2013-09-28 LAB — COMPLETE METABOLIC PANEL WITH GFR
ALBUMIN: 4.3 g/dL (ref 3.5–5.2)
ALK PHOS: 69 U/L (ref 39–117)
ALT: 62 U/L — ABNORMAL HIGH (ref 0–53)
AST: 42 U/L — ABNORMAL HIGH (ref 0–37)
BUN: 21 mg/dL (ref 6–23)
CALCIUM: 9.4 mg/dL (ref 8.4–10.5)
CO2: 24 meq/L (ref 19–32)
Chloride: 104 mEq/L (ref 96–112)
Creat: 0.77 mg/dL (ref 0.50–1.35)
GFR, Est African American: >89 mL/min
GFR, Est Non African American: >89 mL/min
Glucose, Bld: 129 mg/dL — ABNORMAL HIGH (ref 70–99)
Potassium: 4 mEq/L (ref 3.5–5.3)
Sodium: 137 mEq/L (ref 135–145)
TOTAL PROTEIN: 7.3 g/dL (ref 6.0–8.3)
Total Bilirubin: 0.6 mg/dL (ref 0.2–1.2)

## 2013-09-28 LAB — LIPASE: LIPASE: 23 U/L (ref 0–75)

## 2013-09-28 LAB — CBC WITH DIFFERENTIAL/PLATELET
BASOS PCT: 1 % (ref 0–1)
Basophils Absolute: 0.1 10*3/uL (ref 0.0–0.1)
EOS ABS: 0.3 10*3/uL (ref 0.0–0.7)
Eosinophils Relative: 3 % (ref 0–5)
HEMATOCRIT: 44.6 % (ref 39.0–52.0)
HEMOGLOBIN: 15.7 g/dL (ref 13.0–17.0)
Lymphocytes Relative: 36 % (ref 12–46)
Lymphs Abs: 3.5 10*3/uL (ref 0.7–4.0)
MCH: 31.9 pg (ref 26.0–34.0)
MCHC: 35.2 g/dL (ref 30.0–36.0)
MCV: 90.7 fL (ref 78.0–100.0)
MONO ABS: 1 10*3/uL (ref 0.1–1.0)
Monocytes Relative: 11 % (ref 3–12)
NEUTROS ABS: 4.8 10*3/uL (ref 1.7–7.7)
Neutrophils Relative %: 49 % (ref 43–77)
Platelets: 217 10*3/uL (ref 150–400)
RBC: 4.92 MIL/uL (ref 4.22–5.81)
RDW: 12.6 % (ref 11.5–15.5)
WBC: 9.7 10*3/uL (ref 4.0–10.5)

## 2013-09-28 LAB — LIPID PANEL
Cholesterol: 179 mg/dL (ref 0–200)
HDL: 40 mg/dL (ref >39)
LDL CALC: 90 mg/dL (ref 0–99)
Total CHOL/HDL Ratio: 4.5 Ratio
Triglycerides: 243 mg/dL — ABNORMAL HIGH (ref <150)
VLDL: 49 mg/dL — AB (ref 0–40)

## 2013-09-28 LAB — TSH: TSH: 0.94 u[IU]/mL (ref 0.350–4.500)

## 2013-09-28 LAB — POCT GLYCOSYLATED HEMOGLOBIN (HGB A1C): HEMOGLOBIN A1C: 6.4

## 2013-09-28 MED ORDER — GLIPIZIDE 10 MG PO TABS: 10.0000 mg | ORAL_TABLET | Freq: Two times a day (BID) | ORAL | Status: DC

## 2013-09-28 MED ORDER — TRAMADOL HCL 50 MG PO TABS: 50.0000 mg | ORAL_TABLET | Freq: Three times a day (TID) | ORAL | Status: DC | PRN

## 2013-09-28 NOTE — Addendum Note (Signed)
Addended by: Allyson Sabal MD, Ascencion Dike on: 09/28/2013 04:15 PM   Modules accepted: Orders

## 2013-09-28 NOTE — Progress Notes (Signed)
Pt is here for a diabetes f/u. Complains of lft side abdominal pain towards pelvic bone x2 months. Feels sluggish sometimes and experiences body aches, shoulder pains, and excessive urination. Pt is a diabetic. Pain today is dull.

## 2013-09-28 NOTE — Progress Notes (Signed)
Patient ID: [name] [name], male   DOB: 18-Jul-1961, 53 y.o.   MRN: [id number]   CC:  HPI: 53 year old male here for followup of his diabetes. The patient was changed from metformin to [place], since he started the new medication. The patient has started experiencing left lower quadrant pain. The patient has noticed change in the pattern of his bowel movements. He goes twice a day but he is more constipated now. He has also lost 25 pounds in the last 6 months. He drinks alcohol at least 2 beers a day on a daily basis and smokes cigarettes   No Known Allergies Past Medical History  Diagnosis Date  . Depression    Current Outpatient Prescriptions on File Prior to Visit  Medication Sig Dispense Refill  . dicyclomine (BENTYL) 20 MG tablet Take 1 tablet (20 mg total) by mouth every 6 (six) hours.  30 tablet  0  . ondansetron (ZOFRAN) 4 MG tablet Take 1 tablet (4 mg total) by mouth every 8 (eight) hours as needed for nausea.  20 tablet  0  . traMADol (ULTRAM) 50 MG tablet Take 1 tablet (50 mg total) by mouth every 8 (eight) hours as needed.  30 tablet  0   No current facility-administered medications on file prior to visit.   Family History  Problem Relation Age of Onset  . Cancer Mother   . Diabetes Mother    History   Social History  . Marital Status: Single    Spouse Name: N/A    Number of Children: N/A  . Years of Education: N/A   Occupational History  . Not on file.   Social History Main Topics  . Smoking status: Current Every Day Smoker -- 0.50 packs/day for 35 years  . Smokeless tobacco: Not on file  . Alcohol Use: Not on file  . Drug Use: Not on file  . Sexual Activity: Not on file   Other Topics Concern  . Not on file   Social History Narrative  . No narrative on file    Review of Systems  Constitutional: Negative for fever, chills, diaphoresis, activity change, appetite change and fatigue.  HENT: Negative for ear pain, nosebleeds, congestion, facial swelling,  rhinorrhea, neck pain, neck stiffness and ear discharge.   Eyes: Negative for pain, discharge, redness, itching and visual disturbance.  Respiratory: Negative for cough, choking, chest tightness, shortness of breath, wheezing and stridor.   Cardiovascular: Negative for chest pain, palpitations and leg swelling.  Gastrointestinal: Negative for abdominal distention.  Genitourinary: Negative for dysuria, urgency, frequency, hematuria, flank pain, decreased urine volume, difficulty urinating and dyspareunia.  Musculoskeletal: Negative for back pain, joint swelling, arthralgias and gait problem.  Neurological: Negative for dizziness, tremors, seizures, syncope, facial asymmetry, speech difficulty, weakness, light-headedness, numbness and headaches.  Hematological: Negative for adenopathy. Does not bruise/bleed easily.  Psychiatric/Behavioral: Negative for hallucinations, behavioral problems, confusion, dysphoric mood, decreased concentration and agitation.    Objective:  There were no vitals filed for this visit.  Physical Exam  Constitutional: Appears well-developed and well-nourished. No distress.  HENT: Normocephalic. External right and left ear normal. Oropharynx is clear and moist.  Eyes: Conjunctivae and EOM are normal. PERRLA, no scleral icterus.  Neck: Normal ROM. Neck supple. No JVD. No tracheal deviation. No thyromegaly.  CVS: RRR, S1/S2 +, no murmurs, no gallops, no carotid bruit.  Pulmonary: Effort and breath sounds normal, no stridor, rhonchi, wheezes, rales.  Abdominal: Soft. BS +,  no distension, tenderness left lower quadrant, rebound or  guarding.  Musculoskeletal: Normal range of motion. No edema and no tenderness.  Lymphadenopathy: No lymphadenopathy noted, cervical, inguinal. Neuro: Alert. Normal reflexes, muscle tone coordination. No cranial nerve deficit. Skin: Skin is warm and dry. No rash noted. Not diaphoretic. No erythema. No pallor.  Psychiatric: Normal mood and  affect. Behavior, judgment, thought content normal.   Lab Results  Component Value Date   WBC 10.8* 04/25/2013   HGB 17.2* 04/25/2013   HCT 49.3 04/25/2013   MCV 90.8 04/25/2013   PLT 207 04/25/2013   Lab Results  Component Value Date   CREATININE 0.74 06/22/2013   BUN 11 06/22/2013   NA 138 06/22/2013   K 5.2 06/22/2013   CL 101 06/22/2013   CO2 31 06/22/2013    Lab Results  Component Value Date   HGBA1C 9.4* 06/22/2013   Lipid Panel     Component Value Date/Time   CHOL 183 04/25/2013 1446   TRIG 309* 04/25/2013 1446   HDL 33* 04/25/2013 1446   CHOLHDL 5.5 04/25/2013 1446   VLDL 62* 04/25/2013 1446   LDLCALC 88 04/25/2013 1446       Assessment and plan:   Patient Active Problem List   Diagnosis Date Noted  . Tobacco use disorder 08/03/2013  . Diabetes 04/27/2013   Diabetes A1c pending Discontinue farxiga, change to glipizide Urinalysis to rule out microscopic hematuria   Abdominal pain Will obtain a CT abdomen pelvis CMP, lipase, CBC, UA Gastroenterology referral for a colonoscopy      Follow back in 3 months  The patient was given clear instructions to go to ER or return to medical center if symptoms don't improve, worsen or new problems develop. The patient verbalized understanding. The patient was told to call to get any lab results if not heard anything in the next week.

## 2013-09-29 LAB — URINALYSIS
Bilirubin Urine: NEGATIVE
Glucose, UA: >1000 mg/dL — AB
Hgb urine dipstick: NEGATIVE
Leukocytes, UA: NEGATIVE
Nitrite: NEGATIVE
PROTEIN: NEGATIVE mg/dL
Specific Gravity, Urine: >1.03 — ABNORMAL HIGH (ref 1.005–1.030)
UROBILINOGEN UA: 0.2 mg/dL (ref 0.0–1.0)
pH: 5.5 (ref 5.0–8.0)

## 2013-10-02 ENCOUNTER — Ambulatory Visit (HOSPITAL_COMMUNITY): Admission: RE | Admit: 2013-10-02 | Payer: Self-pay | Source: Ambulatory Visit

## 2013-10-04 ENCOUNTER — Encounter: Payer: Self-pay | Admitting: Gastroenterology

## 2013-10-23 ENCOUNTER — Telehealth: Payer: Self-pay | Admitting: Emergency Medicine

## 2013-10-23 ENCOUNTER — Telehealth: Payer: Self-pay | Admitting: Internal Medicine

## 2013-10-23 DIAGNOSIS — S025XXA Fracture of tooth (traumatic), initial encounter for closed fracture: Secondary | ICD-10-CM

## 2013-10-23 NOTE — Telephone Encounter (Signed)
Pt called in requesting Dental referral for cracked tooth with nerve exposure Denies swelling or pain

## 2013-10-23 NOTE — Telephone Encounter (Signed)
Pt has come in today to see if he can get a referral written to get emergency dental surgery; pt explains that he has an exposed nerve coming out of his tooth; please f/u with pt

## 2013-10-23 NOTE — Telephone Encounter (Deleted)
Pt calling with c/o upper left tooth nerve exposure with discomfort with eating. Denies swelling or pain. Dental referral placed

## 2013-11-02 ENCOUNTER — Telehealth: Payer: Self-pay | Admitting: Emergency Medicine

## 2013-11-02 ENCOUNTER — Telehealth: Payer: Self-pay | Admitting: Internal Medicine

## 2013-11-02 ENCOUNTER — Ambulatory Visit (HOSPITAL_COMMUNITY)
Admission: RE | Admit: 2013-11-02 | Discharge: 2013-11-02 | Disposition: A | Discharge status: Home / Self Care | Payer: No Typology Code available for payment source | Source: Ambulatory Visit | Attending: Internal Medicine | Admitting: Internal Medicine

## 2013-11-02 DIAGNOSIS — R109 Unspecified abdominal pain: Secondary | ICD-10-CM | POA: Insufficient documentation

## 2013-11-02 DIAGNOSIS — E119 Type 2 diabetes mellitus without complications: Secondary | ICD-10-CM

## 2013-11-02 MED ORDER — IOHEXOL 300 MG/ML  SOLN
100.0000 mL | Freq: Once | INTRAMUSCULAR | Status: AC | PRN
  Administered 2013-11-02: 100 mL via INTRAVENOUS

## 2013-11-02 NOTE — Telephone Encounter (Signed)
Spoke with pt in regards to a change in [place] to Glipizide. Pt states he doesn't know why med was discontinued. Pt informed medication was stopped due to c/o lower abdominal pain. Informed script Glipizide is ready for pick up

## 2013-11-02 NOTE — Telephone Encounter (Signed)
Pt was looking over AVS upon discharge from clinic and noticed that under Medications it says that he has taken or is taking glipiZIDE (GLUCOTROL) 10 MG tablet but he says he has never taken this medication before and has never heard anything about being prescribed it.  Also Pt is currently taking [place] and which is not listed under medications. Please f/u with pt.

## 2013-11-03 ENCOUNTER — Encounter: Payer: Self-pay | Admitting: Gastroenterology

## 2013-11-03 ENCOUNTER — Ambulatory Visit (INDEPENDENT_AMBULATORY_CARE_PROVIDER_SITE_OTHER): Payer: No Typology Code available for payment source | Admitting: Gastroenterology

## 2013-11-03 ENCOUNTER — Other Ambulatory Visit: Payer: Self-pay | Admitting: Internal Medicine

## 2013-11-03 VITALS — BP 130/88 | HR 99 | Ht 71.0 in

## 2013-11-03 DIAGNOSIS — R1032 Left lower quadrant pain: Secondary | ICD-10-CM

## 2013-11-03 MED ORDER — HYOSCYAMINE SULFATE ER 0.375 MG PO TBCR: EXTENDED_RELEASE_TABLET | ORAL | Status: DC

## 2013-11-03 NOTE — Assessment & Plan Note (Addendum)
Several month history of persistent left lower quadrant pain that began with taking metformin.  There are no particular exacerbating or ameliorating factors.  Symptoms are unassociated with change of bowel habits or with bowel movements.  CT scan is negative.  Symptoms could be medicine-related.  A structural abnormality of the colon should be ruled out.  Patient has never had a colonoscopy.  Recommendations #1 colonoscopy #2 trial of hyomax if pain persists after he switches his oral hypoglycemics; DC Bentyl

## 2013-11-03 NOTE — Addendum Note (Signed)
Addended by: Erskine Emery D on: 11/03/2013 10:02 AM   Modules accepted: Orders, Medications

## 2013-11-03 NOTE — Progress Notes (Addendum)
_                                                                                                                History of Present Illness: 53 year old white male referred for evaluation of left lower quadrant abdominal pain.  For the past 4-5 months he's been complaining of intermittent sharp and persistent aching left lower quadrant pain.  It is unrelated to bowel movements or movement.  He denies change in bowel habits, melena or hematochezia.  He clearly associates abdominal pain with taking metformin.  Pain has not improved with Bentyl. He was diagnosed with diabetes several months ago as well.  He is also loss of weight.  Altogether he is feeling significantly improved since his sugars have gotten under control.  Abdominal pain, however, continues.  Recent CT scan, which I reviewed, demonstrated hepatic steatosis and some prominent nodes in the porta hepatis.  There are no abnormalities in the pelvis or abdomen.    Past Medical History  Diagnosis Date  . Depression   . Diabetes    Past Surgical History  Procedure Laterality Date  . Appendectomy    . Tonsillectomy     family history includes Cancer in his mother; Diabetes in his mother. Current Outpatient Prescriptions  Medication Sig Dispense Refill  . dicyclomine (BENTYL) 20 MG tablet Take 1 tablet (20 mg total) by mouth every 6 (six) hours.  30 tablet  0  . glipiZIDE (GLUCOTROL) 10 MG tablet Take 1 tablet (10 mg total) by mouth 2 (two) times daily before a meal.  60 tablet  3  . ondansetron (ZOFRAN) 4 MG tablet Take 1 tablet (4 mg total) by mouth every 8 (eight) hours as needed for nausea.  20 tablet  0  . traMADol (ULTRAM) 50 MG tablet Take 1 tablet (50 mg total) by mouth every 8 (eight) hours as needed.  45 tablet  0  . zolpidem (AMBIEN) 10 MG tablet Take 10 mg by mouth at bedtime as needed for sleep.       No current facility-administered medications for this visit.   Allergies as of 11/03/2013  . (No Known  Allergies)    reports that he has been smoking.  He does not have any smokeless tobacco history on file. His alcohol and drug histories are not on file.     Review of Systems: He complains of joint pains which he relates to his diabetic medications.  Pertinent positive and negative review of systems were noted in the above HPI section. All other review of systems were otherwise negative.  Vital signs were reviewed in today's medical record Physical Exam: General: Well developed , well nourished, no acute distress Skin: anicteric Head: Normocephalic and atraumatic Eyes:  sclerae anicteric, EOMI Ears: Normal auditory acuity Mouth: No deformity or lesions Neck: Supple, no masses or thyromegaly Lungs: Clear throughout to auscultation Heart: Regular rate and rhythm; no murmurs, rubs or bruits Abdomen: Soft, non tender and non distended. No masses, hepatosplenomegaly or hernias noted. Normal Bowel  sounds Rectal:deferred Musculoskeletal: Symmetrical with no gross deformities  Skin: No lesions on visible extremities Pulses:  Normal pulses noted Extremities: No clubbing, cyanosis, edema or deformities noted Neurological: Alert oriented x 4, grossly nonfocal Cervical Nodes:  No significant cervical adenopathy Inguinal Nodes: No significant inguinal adenopathy Psychological:  Alert and cooperative. Normal mood and affect  See Assessment and Plan under Problem List

## 2013-11-03 NOTE — Patient Instructions (Signed)
You have been scheduled for a colonoscopy with propofol. Please follow written instructions given to you at your visit today.  Please pick up your prep kit at the pharmacy within the next 1-3 days. If you use inhalers (even only as needed), please bring them with you on the day of your procedure. Your physician has requested that you go to www.startemmi.com and enter the access code given to you at your visit today. This web site gives a general overview about your procedure. However, you should still follow specific instructions given to you by our office regarding your preparation for the procedure.  SUPREP SAMPLE  GIVEN TODAY

## 2013-11-03 NOTE — Addendum Note (Signed)
Addended by: Oda Kilts on: 11/03/2013 10:02 AM   Modules accepted: Orders

## 2013-11-06 ENCOUNTER — Telehealth: Payer: Self-pay | Admitting: Emergency Medicine

## 2013-11-06 ENCOUNTER — Telehealth: Payer: Self-pay | Admitting: Gastroenterology

## 2013-11-06 ENCOUNTER — Encounter: Payer: No Typology Code available for payment source | Admitting: Gastroenterology

## 2013-11-06 NOTE — Telephone Encounter (Signed)
Left message for pt to call clinic for results 

## 2013-11-06 NOTE — Telephone Encounter (Signed)
Patient forgot that he had a procedure today, and he ate dinner and breakfast.  States he wants to be cancelled.  Doesn't want to be re-scheduled at this time.  I told him that he will probably be charged $200.  He said that he doesn't pay for the procedure anyway.  Procedure cancelled, and Doctor notified.

## 2013-11-17 ENCOUNTER — Telehealth: Payer: Self-pay

## 2013-11-17 NOTE — Telephone Encounter (Signed)
Patient called stating since he started gipizide his blood sugars have been dropping To 70 and below.  He wants to know if he can start taking it once a day as opposed to twice a day Please advise

## 2013-12-21 ENCOUNTER — Encounter: Payer: Self-pay | Admitting: Internal Medicine

## 2013-12-21 ENCOUNTER — Ambulatory Visit (HOSPITAL_COMMUNITY)
Admission: RE | Admit: 2013-12-21 | Discharge: 2013-12-21 | Disposition: A | Discharge status: Home / Self Care | Payer: No Typology Code available for payment source | Source: Ambulatory Visit | Attending: Internal Medicine | Admitting: Internal Medicine

## 2013-12-21 ENCOUNTER — Ambulatory Visit: Payer: No Typology Code available for payment source | Attending: Internal Medicine | Admitting: Internal Medicine

## 2013-12-21 ENCOUNTER — Ambulatory Visit: Payer: No Typology Code available for payment source

## 2013-12-21 VITALS — BP 162/88 | HR 86 | Temp 98.7°F | Resp 16 | Wt 182.0 lb

## 2013-12-21 DIAGNOSIS — E119 Type 2 diabetes mellitus without complications: Secondary | ICD-10-CM | POA: Insufficient documentation

## 2013-12-21 DIAGNOSIS — Z09 Encounter for follow-up examination after completed treatment for conditions other than malignant neoplasm: Secondary | ICD-10-CM | POA: Insufficient documentation

## 2013-12-21 DIAGNOSIS — F17200 Nicotine dependence, unspecified, uncomplicated: Secondary | ICD-10-CM | POA: Insufficient documentation

## 2013-12-21 LAB — COMPREHENSIVE METABOLIC PANEL
ALT: 30 U/L (ref 0–53)
AST: 28 U/L (ref 0–37)
Albumin: 4.4 g/dL (ref 3.5–5.2)
Alkaline Phosphatase: 74 U/L (ref 39–117)
BILIRUBIN TOTAL: 0.8 mg/dL (ref 0.2–1.2)
BUN: 13 mg/dL (ref 6–23)
CALCIUM: 9.6 mg/dL (ref 8.4–10.5)
CHLORIDE: 102 meq/L (ref 96–112)
CO2: 28 meq/L (ref 19–32)
Creat: 0.64 mg/dL (ref 0.50–1.35)
Glucose, Bld: 106 mg/dL — ABNORMAL HIGH (ref 70–99)
Potassium: 4.2 mEq/L (ref 3.5–5.3)
SODIUM: 136 meq/L (ref 135–145)
Total Protein: 7.5 g/dL (ref 6.0–8.3)

## 2013-12-21 LAB — LIPID PANEL
Cholesterol: 184 mg/dL (ref 0–200)
HDL: 48 mg/dL (ref >39)
LDL CALC: 110 mg/dL — AB (ref 0–99)
Total CHOL/HDL Ratio: 3.8 Ratio
Triglycerides: 130 mg/dL (ref <150)
VLDL: 26 mg/dL (ref 0–40)

## 2013-12-21 LAB — POCT GLYCOSYLATED HEMOGLOBIN (HGB A1C): Hemoglobin A1C: 6.5

## 2013-12-21 LAB — GLUCOSE, POCT (MANUAL RESULT ENTRY): POC GLUCOSE: 155 mg/dL — AB (ref 70–99)

## 2013-12-21 MED ORDER — TADALAFIL 20 MG PO TABS: 10.0000 mg | ORAL_TABLET | Freq: Every day | ORAL | Status: DC | PRN

## 2013-12-21 MED ORDER — GLIPIZIDE 10 MG PO TABS: 5.0000 mg | ORAL_TABLET | Freq: Two times a day (BID) | ORAL | Status: DC

## 2013-12-21 NOTE — Patient Instructions (Signed)
Diabetes and Foot Care Diabetes may cause you to have problems because of poor blood supply (circulation) to your feet and legs. This may cause the skin on your feet to become thinner, break easier, and heal more slowly. Your skin may become dry, and the skin may peel and crack. You may also have nerve damage in your legs and feet causing decreased feeling in them. You may not notice minor injuries to your feet that could lead to infections or more serious problems. Taking care of your feet is one of the most important things you can do for yourself.  HOME CARE INSTRUCTIONS  Wear shoes at all times, even in the house. Do not go barefoot. Bare feet are easily injured.  Check your feet daily for blisters, cuts, and redness. If you cannot see the bottom of your feet, use a mirror or ask someone for help.  Wash your feet with warm water (do not use hot water) and mild soap. Then pat your feet and the areas between your toes until they are completely dry. Do not soak your feet as this can dry your skin.  Apply a moisturizing lotion or petroleum jelly (that does not contain alcohol and is unscented) to the skin on your feet and to dry, brittle toenails. Do not apply lotion between your toes.  Trim your toenails straight across. Do not dig under them or around the cuticle. File the edges of your nails with an emery board or nail file.  Do not cut corns or calluses or try to remove them with medicine.  Wear clean socks or stockings every day. Make sure they are not too tight. Do not wear knee-high stockings since they may decrease blood flow to your legs.  Wear shoes that fit properly and have enough cushioning. To break in new shoes, wear them for just a few hours a day. This prevents you from injuring your feet. Always look in your shoes before you put them on to be sure there are no objects inside.  Do not cross your legs. This may decrease the blood flow to your feet.  If you find a minor scrape,  cut, or break in the skin on your feet, keep it and the skin around it clean and dry. These areas may be cleansed with mild soap and water. Do not cleanse the area with peroxide, alcohol, or iodine.  When you remove an adhesive bandage, be sure not to damage the skin around it.  If you have a wound, look at it several times a day to make sure it is healing.  Do not use heating pads or hot water bottles. They may burn your skin. If you have lost feeling in your feet or legs, you may not know it is happening until it is too late.  Make sure your health care provider performs a complete foot exam at least annually or more often if you have foot problems. Report any cuts, sores, or bruises to your health care provider immediately. SEEK MEDICAL CARE IF:   You have an injury that is not healing.  You have cuts or breaks in the skin.  You have an ingrown nail.  You notice redness on your legs or feet.  You feel burning or tingling in your legs or feet.  You have pain or cramps in your legs and feet.  Your legs or feet are numb.  Your feet always feel cold. SEEK IMMEDIATE MEDICAL CARE IF:   There is increasing redness,   swelling, or pain in or around a wound.  There is a red line that goes up your leg.  Pus is coming from a wound.  You develop a fever or as directed by your health care provider.  You notice a bad smell coming from an ulcer or wound. Document Released: 08/14/2000 Document Revised: 04/19/2013 Document Reviewed: 01/24/2013 Exit[employer] Patient Information 2014 [employer].  

## 2013-12-21 NOTE — Progress Notes (Signed)
Pt is here for a f/u on [facility] no new concerns Alert w/no signs of acute distress.

## 2013-12-21 NOTE — Progress Notes (Signed)
Patient ID: [name] [name], male   DOB: [birth year], 53 y.o.   MRN: [id number]   CC: Followup on diabetes  HPI: Patient is 53 year old male who comes to clinic for followup on diabetes. He explains he is currently taking glipizide but had to cut it down to 2 multiple episodes of low sugar levels in the 60s. He is currently taking 5 mg tablet twice daily. He reports compliance with recommended diet next or size, denies chest pain or shortness of breath, no specific abdominal or urinary concerns.  No Known Allergies Past Medical History  Diagnosis Date  . Depression   . Diabetes    Current Outpatient Prescriptions on File Prior to Visit  Medication Sig Dispense Refill  . Hyoscyamine Sulfate 0.375 MG TBCR Take one tab twice a day as needed abdominal pain  25 tablet  1  . zolpidem (AMBIEN) 10 MG tablet Take 10 mg by mouth at bedtime as needed for sleep.       No current facility-administered medications on file prior to visit.   Family History  Problem Relation Age of Onset  . Cancer Mother   . Diabetes Mother    History   Social History  . Marital Status: Single    Spouse Name: N/A    Number of Children: N/A  . Years of Education: N/A   Occupational History  . Not on file.   Social History Main Topics  . Smoking status: Current Every Day Smoker -- 0.50 packs/day for 35 years  . Smokeless tobacco: Not on file  . Alcohol Use: Not on file  . Drug Use: Not on file  . Sexual Activity: Not on file   Other Topics Concern  . Not on file   Social History Narrative  . No narrative on file    Review of Systems  Constitutional: Negative for fever, chills, diaphoresis, activity change, appetite change and fatigue.  HENT: Negative for ear pain, nosebleeds, congestion, facial swelling, rhinorrhea, neck pain, neck stiffness and ear discharge.   Eyes: Negative for pain, discharge, redness, itching and visual disturbance.  Respiratory: Negative for cough, choking, chest tightness,  shortness of breath, wheezing and stridor.   Cardiovascular: Negative for chest pain, palpitations and leg swelling.  Gastrointestinal: Negative for abdominal distention.  Genitourinary: Negative for dysuria, urgency, frequency, hematuria, flank pain, decreased urine volume, difficulty urinating and dyspareunia.  Musculoskeletal: Negative for back pain, joint swelling, arthralgias and gait problem.  Neurological: Negative for dizziness, tremors, seizures, syncope, facial asymmetry, speech difficulty, weakness, light-headedness, numbness and headaches.  Hematological: Negative for adenopathy. Does not bruise/bleed easily.  Psychiatric/Behavioral: Negative for hallucinations, behavioral problems, confusion, dysphoric mood, decreased concentration and agitation.    Objective:   Filed Vitals:   12/21/13 0909  BP: 162/88  Pulse: 86  Temp: 98.7 F (37.1 C)  Resp: 16    Physical Exam  Constitutional: Appears well-developed and well-nourished. No distress.  CVS: RRR, S1/S2 +, no murmurs, no gallops, no carotid bruit.  Pulmonary: Effort and breath sounds normal, no stridor, rhonchi, wheezes, rales.  Abdominal: Soft. BS +,  no distension, tenderness, rebound or guarding.  Musculoskeletal: Normal range of motion. No edema and no tenderness.  Lymphadenopathy: No lymphadenopathy noted, cervical, inguinal. Neuro: Alert. Normal reflexes, muscle tone coordination. No cranial nerve deficit.   Lab Results  Component Value Date   WBC 9.7 09/28/2013   HGB 15.7 09/28/2013   HCT 44.6 09/28/2013   MCV 90.7 09/28/2013   PLT 217 09/28/2013   Lab Results  Component Value Date   CREATININE 0.77 09/28/2013   BUN 21 09/28/2013   NA 137 09/28/2013   K 4.0 09/28/2013   CL 104 09/28/2013   CO2 24 09/28/2013    Lab Results  Component Value Date   HGBA1C 6.5 12/21/2013   Lipid Panel     Component Value Date/Time   CHOL 179 09/28/2013 1552   TRIG 243* 09/28/2013 1552   HDL 40 09/28/2013 1552   CHOLHDL 4.5  09/28/2013 1552   VLDL 49* 09/28/2013 1552   LDLCALC 90 09/28/2013 1552       Assessment and plan:   Patient Active Problem List   Diagnosis Date Noted  . Diabetes - A1c 6.5 and indicative of good diabetic control. Patient has made great success in terms of diabetic management. His A1c over 6 months ago was greater than 14 and now it's down to 6.5. We'll cut down the dose of glipizide to 5 mg by mouth twice a day. Patient advised to continue following recommendations on diet and exercise. I have examined his feet today and no acute findings noted. Sensation is intact to soft touch in both feet bilaterally.  04/27/2013

## 2013-12-22 ENCOUNTER — Other Ambulatory Visit: Payer: Self-pay | Admitting: Internal Medicine

## 2013-12-22 MED ORDER — TADALAFIL 20 MG PO TABS: 10.0000 mg | ORAL_TABLET | Freq: Every day | ORAL | Status: DC | PRN

## 2013-12-28 ENCOUNTER — Ambulatory Visit: Payer: Self-pay | Admitting: Internal Medicine

## 2014-02-12 ENCOUNTER — Telehealth: Payer: Self-pay | Admitting: Gastroenterology

## 2014-02-12 NOTE — Telephone Encounter (Signed)
Phoned pt and advised him to not have any more clear liquids with red in it.  We also discussed clear liquids and what is recommended.  Pt verbalized understanding.

## 2014-02-13 ENCOUNTER — Encounter: Payer: Self-pay | Admitting: Gastroenterology

## 2014-02-13 ENCOUNTER — Ambulatory Visit (AMBULATORY_SURGERY_CENTER): Payer: No Typology Code available for payment source | Admitting: Gastroenterology

## 2014-02-13 VITALS — BP 138/82 | HR 59 | Temp 98.0°F | Resp 16 | Ht 71.0 in | Wt 182.0 lb

## 2014-02-13 DIAGNOSIS — D126 Benign neoplasm of colon, unspecified: Secondary | ICD-10-CM

## 2014-02-13 DIAGNOSIS — R1032 Left lower quadrant pain: Secondary | ICD-10-CM

## 2014-02-13 DIAGNOSIS — K5730 Diverticulosis of large intestine without perforation or abscess without bleeding: Secondary | ICD-10-CM

## 2014-02-13 LAB — GLUCOSE, CAPILLARY
GLUCOSE-CAPILLARY: 128 mg/dL — AB (ref 70–99)
Glucose-Capillary: 116 mg/dL — ABNORMAL HIGH (ref 70–99)

## 2014-02-13 MED ORDER — SODIUM CHLORIDE 0.9 % IV SOLN: 500.0000 mL | INTRAVENOUS | Status: DC

## 2014-02-13 NOTE — Patient Instructions (Addendum)
YOU HAD AN ENDOSCOPIC PROCEDURE TODAY AT THE [facility]: Refer to the procedure report that was given to you for any specific questions about what was found during the examination.  If the procedure report does not answer your questions, please call your gastroenterologist to clarify.  If you requested that your care partner not be given the details of your procedure findings, then the procedure report has been included in a sealed envelope for you to review at your convenience later.  YOU SHOULD EXPECT: Some feelings of bloating in the abdomen. Passage of more gas than usual.  Walking can help get rid of the air that was put into your GI tract during the procedure and reduce the bloating. If you had a lower endoscopy (such as a colonoscopy or flexible sigmoidoscopy) you may notice spotting of blood in your stool or on the toilet paper. If you underwent a bowel prep for your procedure, then you may not have a normal bowel movement for a few days.  DIET: Your first meal following the procedure should be a light meal and then it is ok to progress to your normal diet.  A half-sandwich or bowl of soup is an example of a good first meal.  Heavy or fried foods are harder to digest and may make you feel nauseous or bloated.  Likewise meals heavy in dairy and vegetables can cause extra gas to form and this can also increase the bloating.  Drink plenty of fluids but you should avoid alcoholic beverages for 24 hours.  ACTIVITY: Your care partner should take you home directly after the procedure.  You should plan to take it easy, moving slowly for the rest of the day.  You can resume normal activity the day after the procedure however you should NOT DRIVE or use heavy machinery for 24 hours (because of the sedation medicines used during the test).    SYMPTOMS TO REPORT IMMEDIATELY: A gastroenterologist can be reached at any hour.  During normal business hours, 8:30 AM to 5:00 PM Monday through Friday,  call [phone].  After hours and on weekends, please call the GI answering service at [phone] who will take a message and have the physician on call contact you.   Following lower endoscopy (colonoscopy or flexible sigmoidoscopy):  Excessive amounts of blood in the stool  Significant tenderness or worsening of abdominal pains  Swelling of the abdomen that is new, acute  Fever of 100F or higher  FOLLOW UP: If any biopsies were taken you will be contacted by phone or by letter within the next 1-3 weeks.  Call your gastroenterologist if you have not heard about the biopsies in 3 weeks.  Our staff will call the home number listed on your records the next business day following your procedure to check on you and address any questions or concerns that you may have at that time regarding the information given to you following your procedure. This is a courtesy call and so if there is no answer at the home number and we have not heard from you through the emergency physician on call, we will assume that you have returned to your regular daily activities without incident.  SIGNATURES/CONFIDENTIALITY: You and/or your care partner have signed paperwork which will be entered into your electronic medical record.  These signatures attest to the fact that that the information above on your After Visit Summary has been reviewed and is understood.  Full responsibility of the confidentiality of this   discharge information lies with you and/or your care-partner.  Recommendations Next colonoscopy in 3 years pending pathology report No aspirin, aspirin products, or anti-inflammatory medications for  Weeks.

## 2014-02-13 NOTE — Progress Notes (Signed)
Report to PACU, RN, vss, BBS= Clear.  

## 2014-02-13 NOTE — Op Note (Signed)
[employer]er  [employer]. [place], [id number]   COLONOSCOPY PROCEDURE REPORT  PATIENT: [name], [name]  MR#: [id number] BIRTHDATE: [birth year] , 52  yrs. old GENDER: Male ENDOSCOPIST: Inda Castle, MD REFERRED BY: PROCEDURE DATE:  02/13/2014 PROCEDURE:   Colonoscopy with snare polypectomy and Submucosal injection, any substance First Screening Colonoscopy - Avg.  risk and is 50 yrs.  old or older Yes.  Prior Negative Screening - Now for repeat screening. N/A  History of Adenoma - Now for follow-up colonoscopy & has been > or = to 3 yrs.  N/A  Polyps Removed Today? Yes. ASA CLASS:   Class II INDICATIONS:average risk screening and abdominal pain in the lower left quadrant. MEDICATIONS: MAC sedation, administered by CRNA and propofol (Diprivan) 400mg  IV  DESCRIPTION OF PROCEDURE:   After the risks benefits and alternatives of the procedure were thoroughly explained, informed consent was obtained.  A digital rectal exam revealed no abnormalities of the rectum.   The LB [id number]  endoscope was introduced through the anus and advanced to the cecum, which was identified by both the appendix and ileocecal valve. No adverse events experienced.   The quality of the prep was Suprep good  The instrument was then slowly withdrawn as the colon was fully examined.      COLON FINDINGS: A sessile polyp measuring 2 cm in size was found at the cecum.  Endoscopic mucosal resection was performed by injecting saline into the submucosa to raise the lesion and polypectomy was performed with snare cautery.  The polyp lifted well after submucosal injection The polyp lifted well after submucosal injection.  The resection was complete and the polyp tissue was completely retrieved.   There was mild scattered diverticulosis noted in the descending colon, sigmoid colon, transverse colon, and ascending colon.   The colon mucosa was otherwise normal. Retroflexed views  revealed no abnormalities. The time to cecum=2 minutes 41 seconds.  Withdrawal time=14 minutes 29 seconds.  The scope was withdrawn and the procedure completed. COMPLICATIONS: There were no complications.  ENDOSCOPIC IMPRESSION: 1.   Sessile polyp measuring 2 cm in size was found at the cecum; endoscopic mucosal resection was performed 2.   There was mild diverticulosis noted in the descending colon, sigmoid colon, transverse colon, and ascending colon 3.   The colon mucosa was otherwise normal  RECOMMENDATIONS: Colonoscopy 3 years pending pathology results   eSigned:  Inda Castle, MD 02/13/2014 9:18 AM   cc: Mart Piggs, MD   PATIENT NAME:  [name] [name], [name] [name] MR#: [id number]

## 2014-02-13 NOTE — Progress Notes (Signed)
Called to room to assist during endoscopic procedure.  Patient ID and intended procedure confirmed with present staff. Received instructions for my participation in the procedure from the performing physician.  

## 2014-02-14 ENCOUNTER — Telehealth: Payer: Self-pay

## 2014-02-14 NOTE — Telephone Encounter (Signed)
  Follow up Call-  Call back number 02/13/2014  Post procedure Call Back phone  # [phone]  Permission to leave phone message Yes     Patient questions:  Do you have a fever, pain , or abdominal swelling? no Pain Score  0 *  Have you tolerated food without any problems? yes  Have you been able to return to your normal activities? yes  Do you have any questions about your discharge instructions: Diet   no Medications  no Follow up visit  no  Do you have questions or concerns about your Care? no  Actions: * If pain score is 4 or above: No action needed, pain <4.

## 2014-02-15 ENCOUNTER — Telehealth: Payer: Self-pay | Admitting: Gastroenterology

## 2014-02-15 ENCOUNTER — Encounter (HOSPITAL_COMMUNITY): Payer: Self-pay | Admitting: Internal Medicine

## 2014-02-15 ENCOUNTER — Encounter (HOSPITAL_COMMUNITY): Payer: Self-pay | Admitting: Emergency Medicine

## 2014-02-15 ENCOUNTER — Emergency Department (HOSPITAL_COMMUNITY)
Admission: EM | Admit: 2014-02-15 | Discharge: 2014-02-15 | Disposition: A | Discharge status: Home / Self Care | Payer: No Typology Code available for payment source | Attending: Emergency Medicine | Admitting: Emergency Medicine

## 2014-02-15 ENCOUNTER — Inpatient Hospital Stay (HOSPITAL_COMMUNITY)
Admission: AD | Admit: 2014-02-15 | Discharge: 2014-02-16 | DRG: 920 | Disposition: A | Discharge status: Home / Self Care | Payer: No Typology Code available for payment source | Source: Ambulatory Visit | Attending: Internal Medicine | Admitting: Internal Medicine

## 2014-02-15 DIAGNOSIS — Z6827 Body mass index (BMI) 27.0-27.9, adult: Secondary | ICD-10-CM

## 2014-02-15 DIAGNOSIS — Z8659 Personal history of other mental and behavioral disorders: Secondary | ICD-10-CM | POA: Insufficient documentation

## 2014-02-15 DIAGNOSIS — IMO0002 Reserved for concepts with insufficient information to code with codable children: Principal | ICD-10-CM | POA: Diagnosis present

## 2014-02-15 DIAGNOSIS — Y849 Medical procedure, unspecified as the cause of abnormal reaction of the patient, or of later complication, without mention of misadventure at the time of the procedure: Secondary | ICD-10-CM | POA: Diagnosis present

## 2014-02-15 DIAGNOSIS — Z9889 Other specified postprocedural states: Secondary | ICD-10-CM | POA: Insufficient documentation

## 2014-02-15 DIAGNOSIS — Z79899 Other long term (current) drug therapy: Secondary | ICD-10-CM | POA: Insufficient documentation

## 2014-02-15 DIAGNOSIS — E119 Type 2 diabetes mellitus without complications: Secondary | ICD-10-CM | POA: Diagnosis present

## 2014-02-15 DIAGNOSIS — D62 Acute posthemorrhagic anemia: Secondary | ICD-10-CM | POA: Diagnosis present

## 2014-02-15 DIAGNOSIS — F17200 Nicotine dependence, unspecified, uncomplicated: Secondary | ICD-10-CM | POA: Diagnosis present

## 2014-02-15 DIAGNOSIS — K5730 Diverticulosis of large intestine without perforation or abscess without bleeding: Secondary | ICD-10-CM | POA: Diagnosis present

## 2014-02-15 DIAGNOSIS — Z833 Family history of diabetes mellitus: Secondary | ICD-10-CM

## 2014-02-15 DIAGNOSIS — K625 Hemorrhage of anus and rectum: Secondary | ICD-10-CM

## 2014-02-15 LAB — HEMOGLOBIN AND HEMATOCRIT, BLOOD
HCT: 35.7 % — ABNORMAL LOW (ref 39.0–52.0)
HEMOGLOBIN: 12.5 g/dL — AB (ref 13.0–17.0)

## 2014-02-15 LAB — CBC WITH DIFFERENTIAL/PLATELET
Basophils Absolute: 0.1 10*3/uL (ref 0.0–0.1)
Basophils Relative: 1 % (ref 0–1)
Eosinophils Absolute: 0.3 10*3/uL (ref 0.0–0.7)
Eosinophils Relative: 2 % (ref 0–5)
HCT: 40.1 % (ref 39.0–52.0)
Hemoglobin: 13.9 g/dL (ref 13.0–17.0)
Lymphocytes Relative: 31 % (ref 12–46)
Lymphs Abs: 3.6 10*3/uL (ref 0.7–4.0)
MCH: 31.7 pg (ref 26.0–34.0)
MCHC: 34.7 g/dL (ref 30.0–36.0)
MCV: 91.6 fL (ref 78.0–100.0)
Monocytes Absolute: 1.3 10*3/uL — ABNORMAL HIGH (ref 0.1–1.0)
Monocytes Relative: 11 % (ref 3–12)
Neutro Abs: 6.3 10*3/uL (ref 1.7–7.7)
Neutrophils Relative %: 55 % (ref 43–77)
Platelets: 216 10*3/uL (ref 150–400)
RBC: 4.38 MIL/uL (ref 4.22–5.81)
RDW: 12.4 % (ref 11.5–15.5)
WBC: 11.6 10*3/uL — ABNORMAL HIGH (ref 4.0–10.5)

## 2014-02-15 LAB — BASIC METABOLIC PANEL
BUN: 16 mg/dL (ref 6–23)
CO2: 22 mEq/L (ref 19–32)
Calcium: 9.7 mg/dL (ref 8.4–10.5)
Chloride: 103 mEq/L (ref 96–112)
Creatinine, Ser: 0.76 mg/dL (ref 0.50–1.35)
GFR calc Af Amer: >90 mL/min (ref >90)
GFR calc non Af Amer: >90 mL/min (ref >90)
Glucose, Bld: 134 mg/dL — ABNORMAL HIGH (ref 70–99)
Potassium: 4.6 mEq/L (ref 3.7–5.3)
Sodium: 137 mEq/L (ref 137–147)

## 2014-02-15 LAB — TYPE AND SCREEN
ABO/RH(D): A POS
Antibody Screen: NEGATIVE

## 2014-02-15 LAB — GLUCOSE, CAPILLARY: GLUCOSE-CAPILLARY: 157 mg/dL — AB (ref 70–99)

## 2014-02-15 LAB — ABO/RH: ABO/RH(D): A POS

## 2014-02-15 MED ORDER — PEG-KCL-NACL-NASULF-NA ASC-C 100 G PO SOLR
0.5000 | Freq: Once | ORAL | Status: AC
  Administered 2014-02-16: 100 g via ORAL

## 2014-02-15 MED ORDER — SODIUM CHLORIDE 0.9 % IV SOLN: INTRAVENOUS | Status: DC

## 2014-02-15 MED ORDER — HYOSCYAMINE SULFATE ER 0.375 MG PO TBCR
0.3750 mg | EXTENDED_RELEASE_TABLET | Freq: Two times a day (BID) | ORAL | Status: DC | PRN
  Filled 2014-02-15: qty 1

## 2014-02-15 MED ORDER — POTASSIUM CHLORIDE IN NACL 20-0.45 MEQ/L-% IV SOLN
INTRAVENOUS | Status: DC
  Administered 2014-02-15 – 2014-02-16 (×2): via INTRAVENOUS
  Filled 2014-02-15 (×3): qty 1000

## 2014-02-15 MED ORDER — PNEUMOCOCCAL VAC POLYVALENT 25 MCG/0.5ML IJ INJ
0.5000 mL | INJECTION | INTRAMUSCULAR | Status: AC
  Administered 2014-02-16: 0.5 mL via INTRAMUSCULAR
  Filled 2014-02-15 (×2): qty 0.5

## 2014-02-15 MED ORDER — ZOLPIDEM TARTRATE 10 MG PO TABS
10.0000 mg | ORAL_TABLET | Freq: Every evening | ORAL | Status: DC | PRN
  Administered 2014-02-15: 10 mg via ORAL
  Filled 2014-02-15: qty 1

## 2014-02-15 MED ORDER — SODIUM CHLORIDE 0.9 % IJ SOLN: 3.0000 mL | INTRAMUSCULAR | Status: DC | PRN

## 2014-02-15 MED ORDER — ACETAMINOPHEN 650 MG RE SUPP: 650.0000 mg | Freq: Four times a day (QID) | RECTAL | Status: DC | PRN

## 2014-02-15 MED ORDER — HYDROCODONE-ACETAMINOPHEN 5-325 MG PO TABS
1.0000 | ORAL_TABLET | ORAL | Status: DC | PRN
  Administered 2014-02-15 – 2014-02-16 (×2): 1 via ORAL
  Filled 2014-02-15 (×2): qty 1

## 2014-02-15 MED ORDER — ONDANSETRON HCL 4 MG/2ML IJ SOLN: 4.0000 mg | Freq: Four times a day (QID) | INTRAMUSCULAR | Status: DC | PRN

## 2014-02-15 MED ORDER — GLIPIZIDE 5 MG PO TABS
5.0000 mg | ORAL_TABLET | Freq: Two times a day (BID) | ORAL | Status: DC
  Administered 2014-02-15: 5 mg via ORAL
  Filled 2014-02-15 (×4): qty 1

## 2014-02-15 MED ORDER — PEG-KCL-NACL-NASULF-NA ASC-C 100 G PO SOLR: 1.0000 | Freq: Once | ORAL | Status: DC

## 2014-02-15 MED ORDER — SODIUM CHLORIDE 0.9 % IJ SOLN
3.0000 mL | Freq: Two times a day (BID) | INTRAMUSCULAR | Status: DC
  Administered 2014-02-15: 3 mL via INTRAVENOUS

## 2014-02-15 MED ORDER — SODIUM CHLORIDE 0.9 % IV SOLN: 250.0000 mL | INTRAVENOUS | Status: DC | PRN

## 2014-02-15 MED ORDER — SODIUM CHLORIDE 0.9 % IV BOLUS (SEPSIS)
1000.0000 mL | Freq: Once | INTRAVENOUS | Status: AC
  Administered 2014-02-15: 1000 mL via INTRAVENOUS

## 2014-02-15 MED ORDER — ACETAMINOPHEN 325 MG PO TABS
650.0000 mg | ORAL_TABLET | Freq: Four times a day (QID) | ORAL | Status: DC | PRN
  Administered 2014-02-15: 650 mg via ORAL
  Filled 2014-02-15: qty 2

## 2014-02-15 MED ORDER — ONDANSETRON HCL 4 MG PO TABS: 4.0000 mg | ORAL_TABLET | Freq: Four times a day (QID) | ORAL | Status: DC | PRN

## 2014-02-15 MED ORDER — PEG-KCL-NACL-NASULF-NA ASC-C 100 G PO SOLR
0.5000 | Freq: Once | ORAL | Status: AC
  Administered 2014-02-15: 100 g via ORAL
  Filled 2014-02-15: qty 1

## 2014-02-15 NOTE — ED Provider Notes (Signed)
CSN: [id number]     Arrival date & time 02/15/14  1124 History   First MD Initiated Contact with Patient 02/15/14 1148     Chief Complaint  Patient presents with  . Rectal Bleeding     (Consider location/radiation/quality/duration/timing/severity/associated sxs/prior Treatment) HPI  52ym with rectal bleeding. Onset last night. Continued today. Had colonoscopy 2d ago. BM afterwards with no bleeding. Very early this morning had several with BRB mixed with stool. Urgency to use bathroom. No blood thinners. No dizziness, lightheadedness or SOB. Reports was screening colonoscopy. Denies previous hx of significant GI bleed.    Past Medical History  Diagnosis Date  . Depression   . Diabetes    Past Surgical History  Procedure Laterality Date  . Appendectomy    . Tonsillectomy     Family History  Problem Relation Age of Onset  . Cancer Mother   . Diabetes Mother   . Colon cancer Neg Hx    History  Substance Use Topics  . Smoking status: Current Every Day Smoker -- 0.50 packs/day for 35 years  . Smokeless tobacco: Never Used  . Alcohol Use: 10.5 oz/week    21 drink(s) per week     Comment: 21 beers a week    Review of Systems  All systems reviewed and negative, other than as noted in HPI.   Allergies  Review of patient's allergies indicates no known allergies.  Home Medications   Prior to Admission medications   Medication Sig Start Date End Date Taking? Authorizing Provider  glipiZIDE (GLUCOTROL) 10 MG tablet Take 0.5 tablets (5 mg total) by mouth 2 (two) times daily before a meal. 12/21/13   Theodis Blaze, MD  Hyoscyamine Sulfate 0.375 MG TBCR Take one tab twice a day as needed abdominal pain 11/03/13   Inda Castle, MD  tadalafil (CIALIS) 20 MG tablet Take 0.5-1 tablets (10-20 mg total) by mouth daily as needed for erectile dysfunction. 12/22/13   Angelica Chessman, MD  zolpidem (AMBIEN) 10 MG tablet Take 10 mg by mouth at bedtime as needed for sleep.    Historical  Provider, MD   BP 140/84  Pulse 91  Temp(Src) 99.1 F (37.3 C) (Oral)  Resp 18  SpO2 96% Physical Exam  Nursing note and vitals reviewed. Constitutional: He appears well-developed and well-nourished. No distress.  HENT:  Head: Normocephalic and atraumatic.  Eyes: Conjunctivae are normal. Right eye exhibits no discharge. Left eye exhibits no discharge.  Neck: Neck supple.  Cardiovascular: Normal rate, regular rhythm and normal heart sounds.  Exam reveals no gallop and no friction rub.   No murmur heard. Pulmonary/Chest: Effort normal and breath sounds normal. No respiratory distress.  Abdominal: Soft. He exhibits no distension. There is no tenderness.  Genitourinary:  No external lesions noted. Small amount of BRB on glove.   Musculoskeletal: He exhibits no edema and no tenderness.  Neurological: He is alert.  Skin: Skin is warm and dry.  Psychiatric: He has a normal mood and affect. His behavior is normal. Thought content normal.    ED Course  Procedures (including critical care time) Labs Review Labs Reviewed  CBC WITH DIFFERENTIAL - Abnormal; Notable for the following:    WBC 11.6 (*)    Monocytes Absolute 1.3 (*)    All other components within normal limits  BASIC METABOLIC PANEL - Abnormal; Notable for the following:    Glucose, Bld 134 (*)    All other components within normal limits  TYPE AND SCREEN  ABO/RH  Imaging Review No results found.   EKG Interpretation None      MDM   Final diagnoses:  Rectal bleeding        Virgel Manifold, MD 02/21/14 [phone]

## 2014-02-15 NOTE — Telephone Encounter (Signed)
Pt states he did ok the day after his colon but last night he had some blood in his stool. At midnight he states he went to the bathroom and passed all blood. Now pt states he is passing clots. Pt instructed to go to [facility] ER to be evaluated. Pt verbalized understanding.

## 2014-02-15 NOTE — Discharge Instructions (Signed)
Bloody Stools  Bloody stools often mean that there is a problem in the digestive tract. Your caregiver may use the term "melena" to describe black, tarry, and bad smelling stools or "hematochezia" to describe red or maroon-colored stools. Blood seen in the stool can be caused by bleeding anywhere along the intestinal tract.   A black stool usually means that blood is coming from the upper part of the gastrointestinal tract (esophagus, stomach, or small bowel). Passing maroon-colored stools or bright red blood usually means that blood is coming from lower down in the large bowel or the rectum. However, sometimes massive bleeding in the stomach or small intestine can cause bright red bloody stools.   Consuming black licorice, lead, iron pills, medicines containing bismuth subsalicylate, or blueberries can also cause black stools. Your caregiver can test black stools to see if blood is present.  It is important that the cause of the bleeding be found. Treatment can then be started, and the problem can be corrected. Rectal bleeding may not be serious, but you should not assume everything is okay until you know the cause. It is very important to follow up with your caregiver or a specialist in gastrointestinal problems.  CAUSES   Blood in the stools can come from various underlying causes. Often, the cause is not found during your first visit. Testing is often needed to discover the cause of bleeding in the gastrointestinal tract. Causes range from simple to serious or even life-threatening. Possible causes include:  · Hemorrhoids. These are veins that are full of blood (engorged) in the rectum. They cause pain, inflammation, and may bleed.  · Anal fissures. These are areas of painful tearing which may bleed. They are often caused by passing hard stool.  · Diverticulosis. These are pouches that form on the colon over time, with age, and may bleed significantly.  · Diverticulitis. This is inflammation in areas with  diverticulosis. It can cause pain, fever, and bloody stools, although bleeding is rare.  · Proctitis and colitis. These are inflamed areas of the rectum or colon. They may cause pain, fever, and bloody stools.  · Polyps and cancer. Colon cancer is a leading cause of preventable cancer death. It often starts out as precancerous polyps that can be removed during a colonoscopy, preventing progression into cancer. Sometimes, polyps and cancer may cause rectal bleeding.  · Gastritis and ulcers. Bleeding from the upper gastrointestinal tract (near the stomach) may travel through the intestines and produce black, sometimes tarry, often bad smelling stools. In certain cases, if the bleeding is fast enough, the stools may not be black, but red and the condition may be life-threatening.  SYMPTOMS   You may have stools that are bright red and bloody, that are normal color with blood on them, or that are dark black and tarry. In some cases, you may only have blood in the toilet bowl. Any of these cases need medical care. You may also have:  · Pain at the anus or anywhere in the rectum.  · Lightheadedness or feeling faint.  · Extreme weakness.  · Nausea or vomiting.  · Fever.  DIAGNOSIS  Your caregiver may use the following methods to find the cause of your bleeding:  · Taking a medical history. Age is important. Older people tend to develop polyps and cancer more often. If there is anal pain and a hard, large stool associated with bleeding, a tear of the anus may be the cause. If blood drips into the toilet after a bowel movement, bleeding hemorrhoids may be the   problem. The color and frequency of the bleeding are additional considerations. In most cases, the medical history provides clues, but seldom the final answer.  · A visual and finger (digital) exam. Your caregiver will inspect the anal area, looking for tears and hemorrhoids. A finger exam can provide information when there is tenderness or a growth inside. In men, the  prostate is also examined.  · Endoscopy. Several types of small, long scopes (endoscopes) are used to view the colon.  ¨ In the office, your caregiver may use a rigid, or more commonly, a flexible viewing sigmoidoscope. This exam is called flexible sigmoidoscopy. It is performed in 5 to 10 minutes.  ¨ A more thorough exam is accomplished with a colonoscope. It allows your caregiver to view the entire 5 to 6 foot long colon. Medicine to help you relax (sedative) is usually given for this exam. Frequently, a bleeding lesion may be present beyond the reach of the sigmoidoscope. So, a colonoscopy may be the best exam to start with. Both exams are usually done on an outpatient basis. This means the patient does not stay overnight in the hospital or surgery center.  ¨ An upper endoscopy may be needed to examine your stomach. Sedation is used and a flexible endoscope is put in your mouth, down to your stomach.  · A barium enema X-ray. This is an X-ray exam. It uses liquid barium inserted by enema into the rectum. This test alone may not identify an actual bleeding point. X-rays highlight abnormal shadows, such as those made by lumps (tumors), diverticuli, or colitis.  TREATMENT   Treatment depends on the cause of your bleeding.   · For bleeding from the stomach or colon, the caregiver doing your endoscopy or colonoscopy may be able to stop the bleeding as part of the procedure.  · Inflammation or infection of the colon can be treated with medicines.  · Many rectal problems can be treated with creams, suppositories, or warm baths.  · Surgery is sometimes needed.  · Blood transfusions are sometimes needed if you have lost a lot of blood.  · For any bleeding problem, let your caregiver know if you take aspirin or other blood thinners regularly.  HOME CARE INSTRUCTIONS   · Take any medicines exactly as prescribed.  · Keep your stools soft by eating a diet high in fiber. Prunes (1 to 3 a day) work well for many people.  · Drink  enough water and fluids to keep your urine clear or pale yellow.  · Take sitz baths if advised. A sitz bath is when you sit in a bathtub with warm water for 10 to 15 minutes to soak, soothe, and cleanse the rectal area.  · If enemas or suppositories are advised, be sure you know how to use them. Tell your caregiver if you have problems with this.  · Monitor your bowel movements to look for signs of improvement or worsening.  SEEK MEDICAL CARE IF:   · You do not improve in the time expected.  · Your condition worsens after initial improvement.  · You develop any new symptoms.  SEEK IMMEDIATE MEDICAL CARE IF:   · You develop severe or prolonged rectal bleeding.  · You vomit blood.  · You feel weak or faint.  · You have a fever.  MAKE SURE YOU:  · Understand these instructions.  · Will watch your condition.  · Will get help right away if you are not doing well or get worse.    Document Released: 08/07/2002 Document Revised: 11/09/2011 Document Reviewed: 01/02/2011  ExitCare® Patient Information ©2015 [employer]. This information is not intended to replace advice given to you by your health care provider. Make sure you discuss any questions you have with your health care provider.

## 2014-02-15 NOTE — Progress Notes (Signed)
[facility] CL spoke with patient about [name]. Patient has an upcoming apt with [facility] and [facility] on 7/23. Patient kept asking if [employer] helped with ED bill, CL explained to patient several times that [employer] does not help with ED bill because it is not a form of insurance.

## 2014-02-15 NOTE — Consult Note (Signed)
Primary Care Physician:  Faye Ramsay, MD Primary Gastroenterologist:  Dr. Deatra Ina  CHIEF COMPLAINT:  Rectal bleeding  HPI: [name] [name] is a 53 y.o. male with limited PMH who underwent screening colonoscopy by Dr. Deatra Ina on 6/16.  He had a 2 cm polyp removed from the cecum by endoscopic mucosal resection (path pending).  Otherwise had diverticulosis.  Did well yesterday, but woke up this AM around midnight and passed blood.  Had another occurrence this AM with clots and went to the ED.  Hgb was stable/normal at 13.9 grams in the ED today so he was discharged home.  Just as being discharged he had another bloody stool, but was still sent home.  He called our office back at which time we arranged direct admission.  He has no other complaints.   Past Medical History  Diagnosis Date  . Depression   . Diabetes     Past Surgical History  Procedure Laterality Date  . Appendectomy    . Tonsillectomy      Prior to Admission medications   Medication Sig Start Date End Date Taking? Authorizing Provider  glipiZIDE (GLUCOTROL) 10 MG tablet Take 0.5 tablets (5 mg total) by mouth 2 (two) times daily before a meal. 12/21/13   Theodis Blaze, MD  Hyoscyamine Sulfate 0.375 MG TBCR Take one tab twice a day as needed abdominal pain 11/03/13   Inda Castle, MD  tadalafil (CIALIS) 20 MG tablet Take 0.5-1 tablets (10-20 mg total) by mouth daily as needed for erectile dysfunction. 12/22/13   Angelica Chessman, MD  zolpidem (AMBIEN) 10 MG tablet Take 10 mg by mouth at bedtime as needed for sleep.    Historical Provider, MD    Current Facility-Administered Medications  Medication Dose Route Frequency Provider Last Rate Last Dose  . 0.45 % NaCl with KCl 20 mEq / L infusion   Intravenous Continuous Jessica D. Zehr, PA-C      . 0.9 %  sodium chloride infusion  250 mL Intravenous PRN Jessica D. Zehr, PA-C      . acetaminophen (TYLENOL) tablet 650 mg  650 mg Oral Q6H PRN Laban Emperor. Zehr, PA-C       Or    . acetaminophen (TYLENOL) suppository 650 mg  650 mg Rectal Q6H PRN Laban Emperor. Zehr, PA-C      . glipiZIDE (GLUCOTROL) tablet 5 mg  5 mg Oral BID AC Jessica D. Zehr, PA-C      . HYDROcodone-acetaminophen (NORCO/VICODIN) 5-325 MG per tablet 1-2 tablet  1-2 tablet Oral Q4H PRN Laban Emperor. Zehr, PA-C      . Hyoscyamine Sulfate TBCR 0.375 mg  0.375 mg Oral BID PRN Laban Emperor. Zehr, PA-C      . ondansetron (ZOFRAN) tablet 4 mg  4 mg Oral Q6H PRN Laban Emperor. Zehr, PA-C       Or  . ondansetron (ZOFRAN) injection 4 mg  4 mg Intravenous Q6H PRN Jessica D. Zehr, PA-C      . peg 3350 powder (MOVIPREP) kit 200 g  1 kit Oral Once Jessica D. Zehr, PA-C      . sodium chloride 0.9 % injection 3 mL  3 mL Intravenous Q12H Jessica D. Zehr, PA-C      . sodium chloride 0.9 % injection 3 mL  3 mL Intravenous PRN Jessica D. Zehr, PA-C      . zolpidem (AMBIEN) tablet 10 mg  10 mg Oral QHS PRN Laban Emperor. Zehr, PA-C  Facility-Administered Medications Ordered in Other Encounters  Medication Dose Route Frequency Provider Last Rate Last Dose  . 0.9 %  sodium chloride infusion  500 mL Intravenous Continuous Inda Castle, MD        Allergies as of 02/15/2014  . (No Known Allergies)    Family History  Problem Relation Age of Onset  . Cancer Mother   . Diabetes Mother   . Colon cancer Neg Hx     History   Social History  . Marital Status: Single    Spouse Name: N/A    Number of Children: N/A  . Years of Education: N/A   Occupational History  . Not on file.   Social History Main Topics  . Smoking status: Current Every Day Smoker -- 0.50 packs/day for 35 years  . Smokeless tobacco: Never Used  . Alcohol Use: 10.5 oz/week    21 drink(s) per week     Comment: 21 beers a week  . Drug Use: No   Social History Narrative  . [employer]   Review of Systems: Ten point ROS is O/W negative except as mentioned in HPI.  Physical Exam: Vital signs in last 24 hours: Temp:  [99.1 F (37.3 C)] 99.1 F  (37.3 C) (06/18 1137) Pulse Rate:  [77-106] 77 (06/18 1419) Resp:  [18-24] 20 (06/18 1419) BP: (132-140)/(72-84) 132/72 mmHg (06/18 1200) SpO2:  [96 %-100 %] 99 % (06/18 1419)   General:  Alert, Well-developed, well-nourished, pleasant and cooperative in NAD Head:  Normocephalic and atraumatic. Eyes:  Sclera clear, no icterus.  Conjunctiva pink. Ears:  Normal auditory acuity. Mouth:  No deformity or lesions.  Oropharynx pink & moist. Lungs:  Clear throughout to auscultation.   No wheezes, crackles, or rhonchi. No acute distress. Heart:  Regular rate and rhythm; no murmurs, clicks, rubs, or gallops. Abdomen:  Soft, non-distended.  BS present.  Non-tender. Rectal:  Deferred until time of colonoscopy.   Msk:  Symmetrical without gross deformities. Normal posture. Pulses:  Normal pulses noted. Extremities:  Without clubbing or edema. Neurologic:  Alert and  oriented x4;  grossly normal neurologically. Skin:  Intact without significant lesions or rashes. Psych:  Alert and cooperative. Normal mood and affect.  Lab Results:  Recent Labs  02/15/14 1226  WBC 11.6*  HGB 13.9  HCT 40.1  PLT 216   BMET  Recent Labs  02/15/14 1226  NA 137  K 4.6  CL 103  CO2 22  GLUCOSE 134*  BUN 16  CREATININE 0.76  CALCIUM 9.7   Impression / Plan:  -LGIB:  Most likely post-polypectomy bleed with 2 cm polyp removed from the cecum (EMR) on 6/16.  Monitor Hgb every 8 hours for now.  IVF's.  Bowel prep for repeat colonoscopy tomorrow, 6/19.  Clear liquids then NPO after midnight except ice chips. -DM:  Continue home meds.  CBG monitoring.    LOS: 0 days   ZEHR, JESSICA D.  02/15/2014, 4:57 PM   [facility] Attending  I have also seen and assessed the patient and agree with the above note. I suspect he will come to a colonoscopy and clipping of the polypectomy site. The risks and benefits as well as alternatives of endoscopic procedure(s) have been discussed and reviewed. All questions  answered. The patient agrees to proceed.

## 2014-02-15 NOTE — ED Notes (Signed)
Pt had colonoscopy done on Tuesday, pt had BM on Tuesday but no blood. Pt states earlier this around 0100 had 3 BM and all were bright red blood in stool. Had another BM this morning around 0630 all blood and then another one at 0800 BM was black and malodorous.

## 2014-02-16 ENCOUNTER — Encounter (HOSPITAL_COMMUNITY)
Admission: AD | Disposition: A | Discharge status: Home / Self Care | Payer: Self-pay | Source: Ambulatory Visit | Attending: Internal Medicine

## 2014-02-16 ENCOUNTER — Encounter (HOSPITAL_COMMUNITY): Payer: Self-pay

## 2014-02-16 ENCOUNTER — Other Ambulatory Visit: Payer: Self-pay | Admitting: *Deleted

## 2014-02-16 DIAGNOSIS — D62 Acute posthemorrhagic anemia: Secondary | ICD-10-CM

## 2014-02-16 HISTORY — PX: COLONOSCOPY: SHX5424

## 2014-02-16 LAB — GLUCOSE, CAPILLARY: Glucose-Capillary: 140 mg/dL — ABNORMAL HIGH (ref 70–99)

## 2014-02-16 LAB — CBC
HCT: 32.1 % — ABNORMAL LOW (ref 39.0–52.0)
Hemoglobin: 10.9 g/dL — ABNORMAL LOW (ref 13.0–17.0)
MCH: 31.3 pg (ref 26.0–34.0)
MCHC: 34 g/dL (ref 30.0–36.0)
MCV: 92.2 fL (ref 78.0–100.0)
PLATELETS: 195 10*3/uL (ref 150–400)
RBC: 3.48 MIL/uL — AB (ref 4.22–5.81)
RDW: 12.4 % (ref 11.5–15.5)
WBC: 9.7 10*3/uL (ref 4.0–10.5)

## 2014-02-16 SURGERY — COLONOSCOPY
Anesthesia: Moderate Sedation

## 2014-02-16 MED ORDER — SODIUM CHLORIDE 0.9 % IJ SOLN
INTRAMUSCULAR | Status: DC | PRN
  Administered 2014-02-16: 13:00:00

## 2014-02-16 MED ORDER — MIDAZOLAM HCL 10 MG/2ML IJ SOLN
INTRAMUSCULAR | Status: AC
  Filled 2014-02-16: qty 2

## 2014-02-16 MED ORDER — GLIPIZIDE 2.5 MG HALF TABLET
2.5000 mg | ORAL_TABLET | Freq: Every day | ORAL | Status: DC
  Filled 2014-02-16: qty 1

## 2014-02-16 MED ORDER — MIDAZOLAM HCL 5 MG/5ML IJ SOLN
INTRAMUSCULAR | Status: DC | PRN
  Administered 2014-02-16: 1.5 mg via INTRAVENOUS
  Administered 2014-02-16 (×3): 2 mg via INTRAVENOUS

## 2014-02-16 MED ORDER — FENTANYL CITRATE 0.05 MG/ML IJ SOLN
INTRAMUSCULAR | Status: DC | PRN
  Administered 2014-02-16 (×3): 25 ug via INTRAVENOUS

## 2014-02-16 MED ORDER — EPINEPHRINE HCL 0.1 MG/ML IJ SOSY
PREFILLED_SYRINGE | INTRAMUSCULAR | Status: AC
  Filled 2014-02-16: qty 10

## 2014-02-16 MED ORDER — GLIPIZIDE 5 MG PO TABS
5.0000 mg | ORAL_TABLET | Freq: Every day | ORAL | Status: DC
  Filled 2014-02-16: qty 1

## 2014-02-16 MED ORDER — FENTANYL CITRATE 0.05 MG/ML IJ SOLN
INTRAMUSCULAR | Status: AC
  Filled 2014-02-16: qty 2

## 2014-02-16 MED ORDER — CITALOPRAM HYDROBROMIDE 20 MG PO TABS
20.0000 mg | ORAL_TABLET | Freq: Every day | ORAL | Status: DC
  Administered 2014-02-16: 20 mg via ORAL
  Filled 2014-02-16: qty 1

## 2014-02-16 NOTE — Progress Notes (Signed)
     [facility] Progress Note  Subjective:  No complaints.  Last BM was about an hour ago and there was still some blood.  Objective:  Vital signs in last 24 hours: Temp:  [97.8 F (36.6 C)-99.1 F (37.3 C)] 98.5 F (36.9 C) (06/19 0700) Pulse Rate:  [69-106] 69 (06/19 0700) Resp:  [16-24] 17 (06/19 0700) BP: (132-147)/(72-88) 132/83 mmHg (06/19 0700) SpO2:  [96 %-100 %] 99 % (06/19 0700) Weight:  [197 lb 5 oz (89.5 kg)] 197 lb 5 oz (89.5 kg) (06/18 1725) Last BM Date: 02/16/14 General:  Alert, Well-developed, in NAD Heart:  Regular rate and rhythm; no murmurs Pulm:  CTAB.  No W/R/R. Abdomen:  Soft, non-distended. Normal bowel sounds.  Non-tender. Extremities:  Without edema. Neurologic:  Alert and  oriented x4;  grossly normal neurologically. Psych:  Alert and cooperative. Normal mood and affect.  Lab Results:  Recent Labs  02/15/14 1226 02/15/14 1957 02/16/14 0345  WBC 11.6*  --  9.7  HGB 13.9 12.5* 10.9*  HCT 40.1 35.7* 32.1*  PLT 216  --  195   BMET  Recent Labs  02/15/14 1226  NA 137  K 4.6  CL 103  CO2 22  GLUCOSE 134*  BUN 16  CREATININE 0.76  CALCIUM 9.7   Assessment / Plan: -LGIB: Most likely post-polypectomy bleed with 2 cm polyp removed from the cecum (EMR) on 6/16. -ABLA secondary to above.  Hgb down 3 grams. -DM  *Colonoscopy today with possible hemoclip placement.  Possible discharge home later today if all goes well with procedure.   LOS: 1 day   ZEHR, JESSICA D.  02/16/2014, 8:57 AM  Pager number [phone]   [place] Attending  I have also seen and assessed the patient and agree with the above note. Gatha Mayer, MD, [facility] [phone] (pager) 02/16/2014 9:37 AM

## 2014-02-16 NOTE — Discharge Summary (Signed)
[facility] Discharge Summary  Name: [name] [name] MRN: [id number] DOB: [birth year] 53 y.o. PCP:  Faye Ramsay, MD  Date of Admission: 02/15/2014  4:42 PM Date of Discharge: 02/16/2014 Attending Physician: Gatha Mayer, MD  Discharge Diagnosis: Active Problems:   Post-polypectomy bleeding  Consultations:  None  Procedures Performed:  No results found.  GI Procedures:  Colonoscopy on 6/19 by Dr. Carlean Purl with hemoclip placement x3  and epi injection at the polypectomy site in the cecum.  History/Physical Exam:  See Admission H&P  Admission HPI:  Patient was admitted on 6/18 for post-polypectomy bleeding after a 2 cm polyp was removed from the cecum on 6/16.  Hgb was monitored and decreased slightly.  He was given Movi-prep, and colonoscopy was repeated on 6/19 as stated above.  Post-procedure his diet was advanced to carb-modified, which he tolerated well.  At the time of discharge he was not having any further bleeding or any other complaints.  Will repeat CBC on 6/22.     Discharge Vitals:  BP 127/75  Pulse 67  Temp(Src) 97.5 F (36.4 C) (Oral)  Resp 16  Ht 5\' 11"  (1.803 m)  Wt 197 lb 5 oz (89.5 kg)  BMI 27.53 kg/m2  SpO2 100%  Discharge Labs:  Results for orders placed during the hospital encounter of 02/15/14 (from the past 24 hour(s))  GLUCOSE, CAPILLARY     Status: Abnormal   Collection Time    02/15/14  7:30 PM      Result Value Ref Range   Glucose-Capillary 157 (*) 70 - 99 mg/dL   Comment 1 Notify RN    HEMOGLOBIN AND HEMATOCRIT, BLOOD     Status: Abnormal   Collection Time    02/15/14  7:57 PM      Result Value Ref Range   Hemoglobin 12.5 (*) 13.0 - 17.0 g/dL   HCT 35.7 (*) 39.0 - 52.0 %  CBC     Status: Abnormal   Collection Time    02/16/14  3:45 AM      Result Value Ref Range   WBC 9.7  4.0 - 10.5 K/uL   RBC 3.48 (*) 4.22 - 5.81 MIL/uL   Hemoglobin 10.9 (*) 13.0 - 17.0 g/dL   HCT 32.1 (*) 39.0 - 52.0 %   MCV 92.2  78.0 -  100.0 fL   MCH 31.3  26.0 - 34.0 pg   MCHC 34.0  30.0 - 36.0 g/dL   RDW 12.4  11.5 - 15.5 %   Platelets 195  150 - 400 K/uL  GLUCOSE, CAPILLARY     Status: Abnormal   Collection Time    02/16/14  7:00 AM      Result Value Ref Range   Glucose-Capillary 140 (*) 70 - 99 mg/dL   Comment 1 Notify RN      Disposition and follow-up:   [name] was discharged from [facility] in stable condition.    Discharge Medications:   Medication List    ASK your doctor about these medications       citalopram 20 MG tablet  Commonly known as:  CELEXA  Take 20 mg by mouth daily.     glipiZIDE 10 MG tablet  Commonly known as:  GLUCOTROL  Take 2.5-5 mg by mouth daily before breakfast. 5 mg in the morning and 2.5 mg at 1800     tadalafil 20 MG tablet  Commonly known as:  CIALIS  Take 0.5-1 tablets (10-20 mg total)  by mouth daily as needed for erectile dysfunction.     zolpidem 10 MG tablet  Commonly known as:  AMBIEN  Take 10 mg by mouth at bedtime as needed for sleep. At 2000        SignedMyrtice Lauth, JESSICA D. 02/16/2014, 2:59 PM

## 2014-02-16 NOTE — Discharge Instructions (Signed)
No aspirin, Ibuprofen, or other related NSAID's for two weeks. No strenuous activity for 48 hours.  Please come to the basement level of the [facility] building for labs to repeat your blood counts on Monday, 6/22.  We will contact you with those results.

## 2014-02-16 NOTE — Op Note (Addendum)
[facility] [place], [id number]   COLONOSCOPY PROCEDURE REPORT  PATIENT: [name], [name]  MR#: [id number] BIRTHDATE: 04-26-1961 , 52  yrs. old GENDER: Male ENDOSCOPIST: Gatha Mayer, MD, [facility] PROCEDURE DATE:  02/16/2014 PROCEDURE:   Colonoscopy with control of bleeding ASA CLASS:   Class II INDICATIONS:post-polypectomy hemorrhage. MEDICATIONS: Fentanyl 75 mcg IV and Versed 7 mg IV  DESCRIPTION OF PROCEDURE:   After the risks benefits and alternatives of the procedure were thoroughly explained, informed consent was obtained.  A digital rectal exam revealed no abnormalities of the rectum.   The Pentax Ped Colon [id number] endoscope was introduced through the anus and advanced to the cecum, which was identified by both the appendix and ileocecal valve. No adverse events experienced.   The quality of the prep was good, using MoviPrep  The instrument was then slowly withdrawn as the colon was fully examined.  COLON FINDINGS: A medium sized ulcer was found at the cecum.  there was an adherent clot - stigmata of recent hemorrhage. Hemoclips x 3 placed to prevent further bleeding.. Clot removed during this.   A 1:10,000 Epinephrine solution injection was given  (4.5 cc surrounding)to prevent further bleeding Mild diverticulosis was noted in the sigmoid colon.   The colon mucosa was otherwise normal.  Retroflexed views revealed no abnormalities. The time to cecum=not recorded     .  Withdrawal time=not recorded     .  The scope was withdrawn and the procedure completed. COMPLICATIONS: There were no complications.  ENDOSCOPIC IMPRESSION: 1.   Ulcer (post-polypectomy)at the cecum; adherent clot stigmata of bleeding seen -  hemoclips x 3 injection was given to also prevent future bleeding.  1:10,000 Epinephrine solution 2.   Mild diverticulosis was noted in the sigmoid colon 3.   The colon mucosa was otherwise normal - photos slightly blurry due to mucous  on lens - unable to clear  RECOMMENDATIONS: feed and home later today if no bleeding light activity x 48 hrs f/u Hgb at our office on monday with phone call f/u NO ASPIRIN, IBUPROFEN, NAPROXEN, OTHER ANTI-INFLAMMATORY MEDS X 2 WEEKS   eSigned:  Gatha Mayer, MD, [facility] 02/16/2014 1:39 PM Revised: 02/16/2014 1:39 PM  cc: The Patient

## 2014-02-19 ENCOUNTER — Encounter (HOSPITAL_COMMUNITY): Payer: Self-pay | Admitting: Internal Medicine

## 2014-02-20 ENCOUNTER — Telehealth: Payer: Self-pay

## 2014-02-20 ENCOUNTER — Other Ambulatory Visit (INDEPENDENT_AMBULATORY_CARE_PROVIDER_SITE_OTHER): Payer: No Typology Code available for payment source

## 2014-02-20 DIAGNOSIS — D62 Acute posthemorrhagic anemia: Secondary | ICD-10-CM

## 2014-02-20 LAB — CBC WITH DIFFERENTIAL/PLATELET
BASOS ABS: 0.1 10*3/uL (ref 0.0–0.1)
Basophils Relative: 0.6 % (ref 0.0–3.0)
EOS ABS: 0.3 10*3/uL (ref 0.0–0.7)
Eosinophils Relative: 2.2 % (ref 0.0–5.0)
HEMATOCRIT: 35.6 % — AB (ref 39.0–52.0)
Hemoglobin: 12.2 g/dL — ABNORMAL LOW (ref 13.0–17.0)
LYMPHS ABS: 3.5 10*3/uL (ref 0.7–4.0)
Lymphocytes Relative: 24.7 % (ref 12.0–46.0)
MCHC: 34.2 g/dL (ref 30.0–36.0)
MCV: 95 fl (ref 78.0–100.0)
Monocytes Absolute: 1.3 10*3/uL — ABNORMAL HIGH (ref 0.1–1.0)
Monocytes Relative: 9.2 % (ref 3.0–12.0)
Neutro Abs: 8.8 10*3/uL — ABNORMAL HIGH (ref 1.4–7.7)
Neutrophils Relative %: 63.3 % (ref 43.0–77.0)
Platelets: 293 10*3/uL (ref 150.0–400.0)
RBC: 3.75 Mil/uL — ABNORMAL LOW (ref 4.22–5.81)
RDW: 13.3 % (ref 11.5–15.5)
WBC: 13.9 10*3/uL — ABNORMAL HIGH (ref 4.0–10.5)

## 2014-02-20 NOTE — Telephone Encounter (Signed)
Spoke with pt and he states he is feeling ok but a little weak. He is coming in today to have CBC checked. Reports he has had a little abdominal cramping but has not seen any blood.

## 2014-02-20 NOTE — Discharge Summary (Signed)
Agree 

## 2014-02-20 NOTE — Telephone Encounter (Signed)
Message copied by Algernon Huxley on Tue Feb 20, 2014  1:44 PM ------      Message from: Gatha Mayer      Created: Tue Feb 20, 2014  6:15 AM      Regarding: needs CBC       I treated a post-polypectomy bleed in this man last Friday             He needs a phone call f/u (how is he, any bleeding, weakness, etc)and a CBC            As long as ok I think can manage by phone w/o an office visit and f/u HGb as appropriate             ------

## 2014-02-21 ENCOUNTER — Encounter: Payer: Self-pay | Admitting: Gastroenterology

## 2014-02-25 NOTE — Progress Notes (Signed)
Quick Note:  Lets call him tomorrow and get an update on sxs ______

## 2014-02-26 NOTE — Progress Notes (Signed)
Quick Note:  Doubt abdominal pressure is related to procedure or bleeding per se - would give it some time and think it will resolve If not better in 2 weeks call back If worse sooner, more bleeding or fever then call sooner ______

## 2014-02-28 ENCOUNTER — Telehealth: Payer: Self-pay | Admitting: Gastroenterology

## 2014-02-28 NOTE — Telephone Encounter (Signed)
I am sorry he is having pain but he needs an evaluation and exam if he is having pain still that would require narcotics. APP appt would be ok

## 2014-02-28 NOTE — Telephone Encounter (Signed)
Deatra Ina pt had post polypectomy bleed, clips placed 02/16/14 by Dr. Carlean Purl. Pt states he has had some discomfort following procedure and states he is still having some LLQ pain. States he took one of his mother's vicodin tabs last night and slept really well. Pt is requesting some pain medication. Dr. Carlean Purl as doc of the day please advise.

## 2014-02-28 NOTE — Telephone Encounter (Signed)
Offered pt an appt with Alonza Bogus PA today at 1:30pm. Pt states he wants to call me back regarding appt.

## 2014-02-28 NOTE — Telephone Encounter (Signed)
Dr. Carlean Purl there in only one midlevel in today and per Snyder PA her schedule is full. Please advise.

## 2014-03-22 ENCOUNTER — Ambulatory Visit: Payer: No Typology Code available for payment source | Admitting: Internal Medicine

## 2014-04-11 ENCOUNTER — Ambulatory Visit: Payer: Self-pay | Attending: Internal Medicine | Admitting: Internal Medicine

## 2014-04-11 VITALS — BP 157/98 | HR 82 | Temp 98.0°F | Resp 16 | Ht 72.0 in | Wt 181.0 lb

## 2014-04-11 DIAGNOSIS — E119 Type 2 diabetes mellitus without complications: Secondary | ICD-10-CM | POA: Insufficient documentation

## 2014-04-11 LAB — HEMOGLOBIN A1C
Hgb A1c MFr Bld: 6.1 % — ABNORMAL HIGH (ref <5.7)
MEAN PLASMA GLUCOSE: 128 mg/dL — AB (ref <117)

## 2014-04-11 LAB — HEPATITIS PANEL, ACUTE
HCV Ab: REACTIVE — AB
HEP A IGM: NONREACTIVE
HEP B S AG: NEGATIVE
Hep B C IgM: NONREACTIVE

## 2014-04-11 LAB — BASIC METABOLIC PANEL
BUN: 11 mg/dL (ref 6–23)
CALCIUM: 9.8 mg/dL (ref 8.4–10.5)
CHLORIDE: 103 meq/L (ref 96–112)
CO2: 25 mEq/L (ref 19–32)
CREATININE: 0.85 mg/dL (ref 0.50–1.35)
Glucose, Bld: 129 mg/dL — ABNORMAL HIGH (ref 70–99)
Potassium: 4.7 mEq/L (ref 3.5–5.3)
SODIUM: 139 meq/L (ref 135–145)

## 2014-04-11 LAB — POCT GLYCOSYLATED HEMOGLOBIN (HGB A1C): HEMOGLOBIN A1C: 5.7

## 2014-04-11 LAB — GLUCOSE, POCT (MANUAL RESULT ENTRY): POC GLUCOSE: 117 mg/dL — AB (ref 70–99)

## 2014-04-11 NOTE — Progress Notes (Signed)
Pt is here following up on his diabetes.

## 2014-04-12 NOTE — Progress Notes (Signed)
   Subjective:    Patient ID: [name] [name], male    DOB: [birth year], 53 y.o.   MRN: [id number]  HPI Pt is 53 yo male with well controlled DM, presenting for follow up, denies any concerns today, takes medication as perscribed. Requesting hep panel to be checked but denies any specific symptoms.    Review of Systems  Constitutional: Negative for fever, chills, diaphoresis, activity change, appetite change and fatigue.  HENT: Negative for ear pain, nosebleeds, congestion, facial swelling, rhinorrhea, neck pain, neck stiffness and ear discharge.   Eyes: Negative for pain, discharge, redness, itching and visual disturbance.  Respiratory: Negative for cough, choking, chest tightness, shortness of breath, wheezing and stridor.   Cardiovascular: Negative for chest pain, palpitations and leg swelling.  Gastrointestinal: Negative for abdominal distention.  Genitourinary: Negative for dysuria, urgency, frequency, hematuria, flank pain, decreased urine volume, difficulty urinating and dyspareunia.  Musculoskeletal: Negative for back pain, joint swelling, arthralgias and gait problem.  Neurological: Negative for dizziness, tremors, seizures, syncope, facial asymmetry, speech difficulty, weakness, light-headedness, numbness and headaches.  Hematological: Negative for adenopathy. Does not bruise/bleed easily.  Psychiatric/Behavioral: Negative for hallucinations, behavioral problems, confusion, dysphoric mood, decreased concentration and agitation.       Objective:   Physical Exam  Constitutional: Appears well-developed and well-nourished. No distress.  HENT: Normocephalic. External right and left ear normal. Oropharynx is clear and moist.  Eyes: Conjunctivae and EOM are normal. PERRLA, no scleral icterus.  Neck: Normal ROM. Neck supple. No JVD. No tracheal deviation. No thyromegaly.  CVS: RRR, S1/S2 +, no murmurs, no gallops, no carotid bruit.  Pulmonary: Effort and breath sounds normal, no  stridor, rhonchi, wheezes, rales.  Abdominal: Soft. BS +,  no distension, tenderness, rebound or guarding.        Assessment & Plan:  DM - well controlled, A1C stable and improving - no need for refill per pt - continue same regimen

## 2014-05-02 ENCOUNTER — Ambulatory Visit: Payer: Self-pay

## 2014-07-12 ENCOUNTER — Emergency Department (HOSPITAL_COMMUNITY)
Admission: EM | Admit: 2014-07-12 | Discharge: 2014-07-12 | Disposition: A | Discharge status: Home / Self Care | Payer: No Typology Code available for payment source | Source: Home / Self Care | Attending: Family Medicine | Admitting: Family Medicine

## 2014-07-12 ENCOUNTER — Encounter (HOSPITAL_COMMUNITY): Payer: Self-pay | Admitting: Emergency Medicine

## 2014-07-12 DIAGNOSIS — R1111 Vomiting without nausea: Secondary | ICD-10-CM

## 2014-07-12 DIAGNOSIS — K2100 Gastro-esophageal reflux disease with esophagitis, without bleeding: Secondary | ICD-10-CM

## 2014-07-12 DIAGNOSIS — E1169 Type 2 diabetes mellitus with other specified complication: Secondary | ICD-10-CM

## 2014-07-12 MED ORDER — METOCLOPRAMIDE HCL 10 MG PO TABS: ORAL_TABLET | ORAL | Status: DC

## 2014-07-12 NOTE — Discharge Instructions (Signed)
Gastroesophageal Reflux Disease, Adult Zantac 150 mg twice a day. Gastroesophageal reflux disease (GERD) happens when acid from your stomach flows up into the esophagus. When acid comes in contact with the esophagus, the acid causes soreness (inflammation) in the esophagus. Over time, GERD may create small holes (ulcers) in the lining of the esophagus. CAUSES   Increased body weight. This puts pressure on the stomach, making acid rise from the stomach into the esophagus.  Smoking. This increases acid production in the stomach.  Drinking alcohol. This causes decreased pressure in the lower esophageal sphincter (valve or ring of muscle between the esophagus and stomach), allowing acid from the stomach into the esophagus.  Late evening meals and a full stomach. This increases pressure and acid production in the stomach.  A malformed lower esophageal sphincter. Sometimes, no cause is found. SYMPTOMS   Burning pain in the lower part of the mid-chest behind the breastbone and in the mid-stomach area. This may occur twice a week or more often.  Trouble swallowing.  Sore throat.  Dry cough.  Asthma-like symptoms including chest tightness, shortness of breath, or wheezing. DIAGNOSIS  Your caregiver may be able to diagnose GERD based on your symptoms. In some cases, X-rays and other tests may be done to check for complications or to check the condition of your stomach and esophagus. TREATMENT  Your caregiver may recommend over-the-counter or prescription medicines to help decrease acid production. Ask your caregiver before starting or adding any new medicines.  HOME CARE INSTRUCTIONS   Change the factors that you can control. Ask your caregiver for guidance concerning weight loss, quitting smoking, and alcohol consumption.  Avoid foods and drinks that make your symptoms worse, such as:  Caffeine or alcoholic drinks.  Chocolate.  Peppermint or mint flavorings.  Garlic and  onions.  Spicy foods.  Citrus fruits, such as oranges, lemons, or limes.  Tomato-based foods such as sauce, chili, salsa, and pizza.  Fried and fatty foods.  Avoid lying down for the 3 hours prior to your bedtime or prior to taking a nap.  Eat small, frequent meals instead of large meals.  Wear loose-fitting clothing. Do not wear anything tight around your waist that causes pressure on your stomach.  Raise the head of your bed 6 to 8 inches with wood blocks to help you sleep. Extra pillows will not help.  Only take over-the-counter or prescription medicines for pain, discomfort, or fever as directed by your caregiver.  Do not take aspirin, ibuprofen, or other nonsteroidal anti-inflammatory drugs (NSAIDs). SEEK IMMEDIATE MEDICAL CARE IF:   You have pain in your arms, neck, jaw, teeth, or back.  Your pain increases or changes in intensity or duration.  You develop nausea, vomiting, or sweating (diaphoresis).  You develop shortness of breath, or you faint.  Your vomit is green, yellow, black, or looks like coffee grounds or blood.  Your stool is red, bloody, or black. These symptoms could be signs of other problems, such as heart disease, gastric bleeding, or esophageal bleeding. MAKE SURE YOU:   Understand these instructions.  Will watch your condition.  Will get help right away if you are not doing well or get worse. Document Released: 05/27/2005 Document Revised: 11/09/2011 Document Reviewed: 03/06/2011 [facility] Patient Information 2015 [place], [place]. This information is not intended to replace advice given to you by your health care provider. Make sure you discuss any questions you have with your health care provider.  Heartburn Heartburn is a painful, burning sensation in the  chest. It may feel worse in certain positions, such as lying down or bending over. It is caused by stomach acid backing up into the tube that carries food from the mouth down to the stomach  (lower esophagus).  CAUSES   Large meals.  Certain foods and drinks.  Exercise.  Increased acid production.  Being overweight or obese.  Certain medicines. SYMPTOMS   Burning pain in the chest or lower throat.  Bitter taste in the mouth.  Coughing. DIAGNOSIS  If the usual treatments for heartburn do not improve your symptoms, then tests may be done to see if there is another condition present. Possible tests may include:  X-rays.  Endoscopy. This is when a tube with a light and a camera on the end is used to examine the esophagus and the stomach.  A test to measure the amount of acid in the esophagus (pH test).  A test to see if the esophagus is working properly (esophageal manometry).  Blood, breath, or stool tests to check for bacteria that cause ulcers. TREATMENT   Your caregiver may tell you to use certain over-the-counter medicines (antacids, acid reducers) for mild heartburn.  Your caregiver may prescribe medicines to decrease the acid in your stomach or protect your stomach lining.  Your caregiver may recommend certain diet changes.  For severe cases, your caregiver may recommend that the head of your bed be elevated on blocks. (Sleeping with more pillows is not an effective treatment as it only changes the position of your head and does not improve the main problem of stomach acid refluxing into the esophagus.) HOME CARE INSTRUCTIONS   Take all medicines as directed by your caregiver.  Raise the head of your bed by putting blocks under the legs if instructed to by your caregiver.  Do not exercise right after eating.  Avoid eating 2 or 3 hours before bed. Do not lie down right after eating.  Eat small meals throughout the day instead of 3 large meals.  Stop smoking if you smoke.  Maintain a healthy weight.  Identify foods and beverages that make your symptoms worse and avoid them. Foods you may want to avoid  include:  Peppers.  Chocolate.  High-fat foods, including fried foods.  Spicy foods.  Garlic and onions.  Citrus fruits, including oranges, grapefruit, lemons, and limes.  Food containing tomatoes or tomato products.  Mint.  Carbonated drinks, caffeinated drinks, and alcohol.  Vinegar. SEEK IMMEDIATE MEDICAL CARE IF:  You have severe chest pain that goes down your arm or into your jaw or neck.  You feel sweaty, dizzy, or lightheaded.  You are short of breath.  You vomit blood.  You have difficulty or pain with swallowing.  You have bloody or black, tarry stools.  You have episodes of heartburn more than 3 times a week for more than 2 weeks. MAKE SURE YOU:  Understand these instructions.  Will watch your condition.  Will get help right away if you are not doing well or get worse. Document Released: 01/03/2009 Document Revised: 11/09/2011 Document Reviewed: 02/01/2011 [facility] Patient Information 2015 [place], [place]. This information is not intended to replace advice given to you by your health care provider. Make sure you discuss any questions you have with your health care provider.  Esophageal Function Studies This is a test to determine how the esophagus is working. The esophagus is the tube which carries food from your mouth to your stomach. In these studies, there is a measurement of the LES (  lower esophageal sphincter) pressure. This is the pressure of the muscles at the bottom of the esophagus that keep food in your stomach. This same muscle group prevents food from returning up the esophagus. This also measures the contraction to determine whether the esophagus is working normally. In this test, other procedures including acid reflux with pH probe is done. The acid reflux with pH probe is a test which studies acid reflux, the main cause of gastroesophageal reflux (stomach acids refluxing into the lower part of the esophagus). Persons with a dysfunctional LES  will reflux acid into the esophagus. This will cause a drop in pH which can be tested by a pH probe. The pH is the level of acidity or alkalinity measured in the stomach contents. Another test often done with these studies is an acid clearing test which is done to determine how many swallows it takes a patient to completely clear hydrochloric acid from the esophagus. If it takes a patient more than 10 swallows, it typically indicates the possibility of esophagitis (inflammation of the esophagus). Another test that is often performed in this series is the [facility] test (acid perfusion). This is a test that will attempt to reproduce (cause) the symptoms of gastroesophageal reflux you have been having. If you develop pain when hydrochloric acid enters the esophagus, the test is positive and proves that the patient's symptoms are most likely caused by acid reflux. If there is no pain or discomfort, more testing may be done to determine the cause for the your symptoms.  PREPARATION FOR TEST Nothing to eat or drink for at least 8 hours prior to the test. NORMAL FINDINGS  Lower esophageal sphincter pressure: 10-20 mm Hg  Swallowing pattern: normal peristaltic waves  Acid reflux: negative  Acid clearing: less than 10 swallows  Bernstein test: negative Ranges for normal findings may vary among different laboratories and hospitals. You should always check with your doctor after having lab work or other tests done to discuss the meaning of your test results and whether your values are considered within normal limits. MEANING OF TEST  Your caregiver will go over the test results with you and discuss the importance and meaning of your results, as well as treatment options and the need for additional tests if necessary. OBTAINING THE TEST RESULTS  It is your responsibility to obtain your test results. Ask the lab or department performing the test when and how you will get your results. Document Released:  12/18/2004 Document Revised: 11/09/2011 Document Reviewed: 07/27/2008 [facility] Patient Information 2015 [place], [place]. This information is not intended to replace advice given to you by your health care provider. Make sure you discuss any questions you have with your health care provider.  Food Choices for Gastroesophageal Reflux Disease When you have gastroesophageal reflux disease (GERD), the foods you eat and your eating habits are very important. Choosing the right foods can help ease your discomfort.  WHAT GUIDELINES DO I NEED TO FOLLOW?   Choose fruits, vegetables, whole grains, and low-fat dairy products.   Choose low-fat meat, fish, and poultry.  Limit fats such as oils, salad dressings, butter, nuts, and avocado.   Keep a food diary. This helps you identify foods that cause symptoms.   Avoid foods that cause symptoms. These may be different for everyone.   Eat small meals often instead of 3 large meals a day.   Eat your meals slowly, in a place where you are relaxed.   Limit fried foods.   Lacinda Axon  foods using methods other than frying.   Avoid drinking alcohol.   Avoid drinking large amounts of liquids with your meals.   Avoid bending over or lying down until 2-3 hours after eating.  WHAT FOODS ARE NOT RECOMMENDED?  These are some foods and drinks that may make your symptoms worse: Vegetables Tomatoes. Tomato juice. Tomato and spaghetti sauce. Chili peppers. Onion and garlic. Horseradish. Fruits Oranges, grapefruit, and lemon (fruit and juice). Meats High-fat meats, fish, and poultry. This includes hot dogs, ribs, ham, sausage, salami, and bacon. Dairy Whole milk and chocolate milk. Sour cream. Cream. Butter. Ice cream. Cream cheese.  Drinks Coffee and tea. Bubbly (carbonated) drinks or energy drinks. Condiments Hot sauce. Barbecue sauce.  Sweets/Desserts Chocolate and cocoa. Donuts. Peppermint and spearmint. Fats and Oils High-fat foods. This  includes [place] fries and potato chips. Other Vinegar. Strong spices. This includes black pepper, white pepper, red pepper, cayenne, curry powder, cloves, ginger, and chili powder. The items listed above may not be a complete list of foods and drinks to avoid. Contact your dietitian for more information. Document Released: 02/16/2012 Document Revised: 08/22/2013 Document Reviewed: 06/21/2013 [facility] Patient Information 2015 [place], [place]. This information is not intended to replace advice given to you by your health care provider. Make sure you discuss any questions you have with your health care provider.

## 2014-07-12 NOTE — ED Provider Notes (Signed)
CSN: [id number]     Arrival date & time 07/12/14  1014 History   First MD Initiated Contact with Patient 07/12/14 1029     Chief Complaint  Patient presents with  . Emesis   (Consider location/radiation/quality/duration/timing/severity/associated sxs/prior Treatment) HPI Comments: 53 year old male has been having vomiting for the past 4 nights. Usually just one time. During the night he experiences acid reflux, regurgitation of stomach contents and vomiting. This only occurs when supine. Occasionally in the morning he will feel mildly nauseated and burning in the esophagus and oropharynx. He was told some years ago he had a hiatal hernia.he also has diabetes mellitus type 2. He is able to eat foods during the day without having vomiting. Reflux, belching and indigestion occurs intermittently during the day.   Past Medical History  Diagnosis Date  . Depression   . Diabetes    Past Surgical History  Procedure Laterality Date  . Appendectomy    . Tonsillectomy    . Colonoscopy  01/2014  . Colonoscopy N/A 02/16/2014    Procedure: COLONOSCOPY;  Surgeon: Gatha Mayer, MD;  Location: [facility];  Service: Endoscopy;  Laterality: N/A;   Family History  Problem Relation Age of Onset  . Cancer Mother   . Diabetes Mother   . Colon cancer Neg Hx    History  Substance Use Topics  . Smoking status: Current Every Day Smoker -- 0.50 packs/day for 35 years  . Smokeless tobacco: Never Used  . Alcohol Use: 10.5 oz/week    21 drink(s) per week     Comment: 21 beers a week    Review of Systems  Constitutional: Negative.   HENT: Negative.   Respiratory: Negative.   Cardiovascular: Negative.   Gastrointestinal: Negative for abdominal pain, diarrhea and constipation.       As per history of present illness  Genitourinary: Negative.   Neurological: Negative.     Allergies  Review of patient's allergies indicates no known allergies.  Home Medications   Prior to Admission  medications   Medication Sig Start Date End Date Taking? Authorizing Provider  citalopram (CELEXA) 20 MG tablet Take 20 mg by mouth daily.    Historical Provider, MD  glipiZIDE (GLUCOTROL) 10 MG tablet Take 2.5-5 mg by mouth daily before breakfast. 5 mg in the morning and 2.5 mg at 1800    Historical Provider, MD  metoCLOPramide (REGLAN) 10 MG tablet Take one before hs and AM for stomach 07/12/14   Janne Napoleon, NP  tadalafil (CIALIS) 20 MG tablet Take 0.5-1 tablets (10-20 mg total) by mouth daily as needed for erectile dysfunction. 12/22/13   Tresa Garter, MD  zolpidem (AMBIEN) 10 MG tablet Take 10 mg by mouth at bedtime as needed for sleep. [facility] [place] Provider, MD   BP 156/97 mmHg  Pulse 83  Temp(Src) 98.1 F (36.7 C) (Oral)  SpO2 99% Physical Exam  Constitutional: He is oriented to person, place, and time. He appears well-developed and well-nourished. No distress.  HENT:  Mouth/Throat: Oropharynx is clear and moist. No oropharyngeal exudate.  Neck: Normal range of motion. Neck supple.  Cardiovascular: Normal rate, regular rhythm, normal heart sounds and intact distal pulses.   Pulmonary/Chest: Effort normal and breath sounds normal. No respiratory distress.  Abdominal: Soft. Bowel sounds are normal. He exhibits no distension. There is no tenderness. There is no rebound and no guarding.  Neurological: He is alert and oriented to person, place, and time. He exhibits normal muscle tone.  Skin: Skin is warm and dry.  Psychiatric: He has a normal mood and affect.  Nursing note and vitals reviewed.   ED Course  Procedures (including critical care time) Labs Review Labs Reviewed - No data to display  Imaging Review No results found.   MDM   1. Reflux esophagitis   2. Non-intractable vomiting without nausea, vomiting of unspecified type   3. Type 2 diabetes mellitus with other specified complication    Likely diff includes LES dysfunction, gasrtic paresis, hiatal  hernia. Zantac 150 bid and  reglan 10 mg q hs and AM May take your zofran prn Clear liquids and limit food intake 2h before hs      Janne Napoleon, NP 07/12/14 1104

## 2014-07-12 NOTE — ED Notes (Signed)
Patient reports episodes of vomiting, this episode started 11/08.  Reports he is randomly able to hold down food.  Particularly notes vomiting at night, reporting bitter, hot emesis.

## 2014-10-09 ENCOUNTER — Ambulatory Visit: Payer: No Typology Code available for payment source | Attending: Internal Medicine | Admitting: Internal Medicine

## 2014-10-09 ENCOUNTER — Encounter: Payer: Self-pay | Admitting: Internal Medicine

## 2014-10-09 ENCOUNTER — Telehealth: Payer: Self-pay | Admitting: Internal Medicine

## 2014-10-09 VITALS — BP 144/88 | HR 83 | Temp 98.0°F | Resp 16 | Wt 186.4 lb

## 2014-10-09 DIAGNOSIS — R894 Abnormal immunological findings in specimens from other organs, systems and tissues: Secondary | ICD-10-CM

## 2014-10-09 DIAGNOSIS — K219 Gastro-esophageal reflux disease without esophagitis: Secondary | ICD-10-CM | POA: Insufficient documentation

## 2014-10-09 DIAGNOSIS — F17210 Nicotine dependence, cigarettes, uncomplicated: Secondary | ICD-10-CM | POA: Insufficient documentation

## 2014-10-09 DIAGNOSIS — E119 Type 2 diabetes mellitus without complications: Secondary | ICD-10-CM | POA: Insufficient documentation

## 2014-10-09 DIAGNOSIS — F329 Major depressive disorder, single episode, unspecified: Secondary | ICD-10-CM | POA: Insufficient documentation

## 2014-10-09 DIAGNOSIS — Z87898 Personal history of other specified conditions: Secondary | ICD-10-CM | POA: Insufficient documentation

## 2014-10-09 DIAGNOSIS — J029 Acute pharyngitis, unspecified: Secondary | ICD-10-CM

## 2014-10-09 DIAGNOSIS — R200 Anesthesia of skin: Secondary | ICD-10-CM

## 2014-10-09 DIAGNOSIS — N529 Male erectile dysfunction, unspecified: Secondary | ICD-10-CM | POA: Insufficient documentation

## 2014-10-09 DIAGNOSIS — R110 Nausea: Secondary | ICD-10-CM

## 2014-10-09 DIAGNOSIS — R768 Other specified abnormal immunological findings in serum: Secondary | ICD-10-CM | POA: Insufficient documentation

## 2014-10-09 DIAGNOSIS — R202 Paresthesia of skin: Secondary | ICD-10-CM

## 2014-10-09 DIAGNOSIS — R112 Nausea with vomiting, unspecified: Secondary | ICD-10-CM | POA: Insufficient documentation

## 2014-10-09 DIAGNOSIS — F419 Anxiety disorder, unspecified: Secondary | ICD-10-CM | POA: Insufficient documentation

## 2014-10-09 DIAGNOSIS — F17200 Nicotine dependence, unspecified, uncomplicated: Secondary | ICD-10-CM

## 2014-10-09 DIAGNOSIS — E139 Other specified diabetes mellitus without complications: Secondary | ICD-10-CM

## 2014-10-09 DIAGNOSIS — F411 Generalized anxiety disorder: Secondary | ICD-10-CM

## 2014-10-09 DIAGNOSIS — Z720 Tobacco use: Secondary | ICD-10-CM

## 2014-10-09 LAB — COMPLETE METABOLIC PANEL WITH GFR
ALK PHOS: 73 U/L (ref 39–117)
ALT: 33 U/L (ref 0–53)
AST: 28 U/L (ref 0–37)
Albumin: 4.5 g/dL (ref 3.5–5.2)
BILIRUBIN TOTAL: 0.7 mg/dL (ref 0.2–1.2)
BUN: 10 mg/dL (ref 6–23)
CO2: 27 mEq/L (ref 19–32)
CREATININE: 0.73 mg/dL (ref 0.50–1.35)
Calcium: 9.9 mg/dL (ref 8.4–10.5)
Chloride: 105 mEq/L (ref 96–112)
GFR, Est African American: >89 mL/min
Glucose, Bld: 136 mg/dL — ABNORMAL HIGH (ref 70–99)
Potassium: 5.1 mEq/L (ref 3.5–5.3)
Sodium: 135 mEq/L (ref 135–145)
Total Protein: 7.6 g/dL (ref 6.0–8.3)

## 2014-10-09 LAB — POCT RAPID STREP A (OFFICE): Rapid Strep A Screen: NEGATIVE

## 2014-10-09 LAB — GLUCOSE, POCT (MANUAL RESULT ENTRY): POC Glucose: 160 mg/dl — AB (ref 70–99)

## 2014-10-09 LAB — POCT GLYCOSYLATED HEMOGLOBIN (HGB A1C): HEMOGLOBIN A1C: 6.8

## 2014-10-09 MED ORDER — RANITIDINE HCL 75 MG PO TABS: 75.0000 mg | ORAL_TABLET | Freq: Every day | ORAL | Status: DC

## 2014-10-09 MED ORDER — GABAPENTIN 300 MG PO CAPS: 300.0000 mg | ORAL_CAPSULE | Freq: Three times a day (TID) | ORAL | Status: DC

## 2014-10-09 MED ORDER — METOCLOPRAMIDE HCL 10 MG PO TABS: 10.0000 mg | ORAL_TABLET | Freq: Three times a day (TID) | ORAL | Status: DC | PRN

## 2014-10-09 NOTE — Patient Instructions (Addendum)
Diabetes Mellitus and Food It is important for you to manage your blood sugar (glucose) level. Your blood glucose level can be greatly affected by what you eat. Eating healthier foods in the appropriate amounts throughout the day at about the same time each day will help you control your blood glucose level. It can also help slow or prevent worsening of your diabetes mellitus. Healthy eating may even help you improve the level of your blood pressure and reach or maintain a healthy weight.  HOW CAN FOOD AFFECT ME? Carbohydrates Carbohydrates affect your blood glucose level more than any other type of food. Your dietitian will help you determine how many carbohydrates to eat at each meal and teach you how to count carbohydrates. Counting carbohydrates is important to keep your blood glucose at a healthy level, especially if you are using insulin or taking certain medicines for diabetes mellitus. Alcohol Alcohol can cause sudden decreases in blood glucose (hypoglycemia), especially if you use insulin or take certain medicines for diabetes mellitus. Hypoglycemia can be a life-threatening condition. Symptoms of hypoglycemia (sleepiness, dizziness, and disorientation) are similar to symptoms of having too much alcohol.  If your health care provider has given you approval to drink alcohol, do so in moderation and use the following guidelines:  Women should not have more than one drink per day, and men should not have more than two drinks per day. One drink is equal to:  12 oz of beer.  5 oz of wine.  1 oz of hard liquor.  Do not drink on an empty stomach.  Keep yourself hydrated. Have water, diet soda, or unsweetened iced tea.  Regular soda, juice, and other mixers might contain a lot of carbohydrates and should be counted. WHAT FOODS ARE NOT RECOMMENDED? As you make food choices, it is important to remember that all foods are not the same. Some foods have fewer nutrients per serving than other  foods, even though they might have the same number of calories or carbohydrates. It is difficult to get your body what it needs when you eat foods with fewer nutrients. Examples of foods that you should avoid that are high in calories and carbohydrates but low in nutrients include:  Trans fats (most processed foods list trans fats on the Nutrition Facts label).  Regular soda.  Juice.  Candy.  Sweets, such as cake, pie, doughnuts, and cookies.  Fried foods. WHAT FOODS CAN I EAT? Have nutrient-rich foods, which will nourish your body and keep you healthy. The food you should eat also will depend on several factors, including:  The calories you need.  The medicines you take.  Your weight.  Your blood glucose level.  Your blood pressure level.  Your cholesterol level. You also should eat a variety of foods, including:  Protein, such as meat, poultry, fish, tofu, nuts, and seeds (lean animal proteins are best).  Fruits.  Vegetables.  Dairy products, such as milk, cheese, and yogurt (low fat is best).  Breads, grains, pasta, cereal, rice, and beans.  Fats such as olive oil, trans fat-free margarine, canola oil, avocado, and olives. DOES EVERYONE WITH DIABETES MELLITUS HAVE THE SAME MEAL PLAN? Because every person with diabetes mellitus is different, there is not one meal plan that works for everyone. It is very important that you meet with a dietitian who will help you create a meal plan that is just right for you. Document Released: 05/14/2005 Document Revised: 08/22/2013 Document Reviewed: 07/14/2013 [employer] Patient Information 2015 [employer]. This   information is not intended to replace advice given to you by your health care provider. Make sure you discuss any questions you have with your health care provider. Smoking Cessation Quitting smoking is important to your health and has many advantages. However, it is not always easy to quit since nicotine is a very  addictive drug. Oftentimes, people try 3 times or more before being able to quit. This document explains the best ways for you to prepare to quit smoking. Quitting takes hard work and a lot of effort, but you can do it. ADVANTAGES OF QUITTING SMOKING  You will live longer, feel better, and live better.  Your body will feel the impact of quitting smoking almost immediately.  Within 20 minutes, blood pressure decreases. Your pulse returns to its normal level.  After 8 hours, carbon monoxide levels in the blood return to normal. Your oxygen level increases.  After 24 hours, the chance of having a heart attack starts to decrease. Your breath, hair, and body stop smelling like smoke.  After 48 hours, damaged nerve endings begin to recover. Your sense of taste and smell improve.  After 72 hours, the body is virtually free of nicotine. Your bronchial tubes relax and breathing becomes easier.  After 2 to 12 weeks, lungs can hold more air. Exercise becomes easier and circulation improves.  The risk of having a heart attack, stroke, cancer, or lung disease is greatly reduced.  After 1 year, the risk of coronary heart disease is cut in half.  After 5 years, the risk of stroke falls to the same as a nonsmoker.  After 10 years, the risk of lung cancer is cut in half and the risk of other cancers decreases significantly.  After 15 years, the risk of coronary heart disease drops, usually to the level of a nonsmoker.  If you are pregnant, quitting smoking will improve your chances of having a healthy baby.  The people you live with, especially any children, will be healthier.  You will have extra money to spend on things other than cigarettes. QUESTIONS TO THINK ABOUT BEFORE ATTEMPTING TO QUIT You may want to talk about your answers with your health care provider.  Why do you want to quit?  If you tried to quit in the past, what helped and what did not?  What will be the most difficult  situations for you after you quit? How will you plan to handle them?  Who can help you through the tough times? Your family? Friends? A health care provider?  What pleasures do you get from smoking? What ways can you still get pleasure if you quit? Here are some questions to ask your health care provider:  How can you help me to be successful at quitting?  What medicine do you think would be best for me and how should I take it?  What should I do if I need more help?  What is smoking withdrawal like? How can I get information on withdrawal? GET READY  Set a quit date.  Change your environment by getting rid of all cigarettes, ashtrays, matches, and lighters in your home, car, or work. Do not let people smoke in your home.  Review your past attempts to quit. Think about what worked and what did not. GET SUPPORT AND ENCOURAGEMENT You have a better chance of being successful if you have help. You can get support in many ways.  Tell your family, friends, and coworkers that you are going to quit and  need their support. Ask them not to smoke around you.  Get individual, group, or telephone counseling and support. Programs are available at [employer] and health centers. Call your local health department for information about programs in your area.  Spiritual beliefs and practices may help some smokers quit.  Download a "quit meter" on your computer to keep track of quit statistics, such as how long you have gone without smoking, cigarettes not smoked, and money saved.  Get a self-help book about quitting smoking and staying off tobacco. [place] yourself from urges to smoke. Talk to someone, go for a walk, or occupy your time with a task.  Change your normal routine. Take a different route to work. Drink tea instead of coffee. Eat breakfast in a different place.  Reduce your stress. Take a hot bath, exercise, or read a book.  Plan something  enjoyable to do every day. Reward yourself for not smoking.  Explore interactive web-based programs that specialize in helping you quit. GET MEDICINE AND USE IT CORRECTLY Medicines can help you stop smoking and decrease the urge to smoke. Combining medicine with the above behavioral methods and support can greatly increase your chances of successfully quitting smoking.  Nicotine replacement therapy helps deliver nicotine to your body without the negative effects and risks of smoking. Nicotine replacement therapy includes nicotine gum, lozenges, inhalers, nasal sprays, and skin patches. Some may be available over-the-counter and others require a prescription.  Antidepressant medicine helps people abstain from smoking, but how this works is unknown. This medicine is available by prescription.  Nicotinic receptor partial agonist medicine simulates the effect of nicotine in your brain. This medicine is available by prescription. Ask your health care provider for advice about which medicines to use and how to use them based on your health history. Your health care provider will tell you what side effects to look out for if you choose to be on a medicine or therapy. Carefully read the information on the package. Do not use any other product containing nicotine while using a nicotine replacement product.  RELAPSE OR DIFFICULT SITUATIONS Most relapses occur within the first 3 months after quitting. Do not be discouraged if you start smoking again. Remember, most people try several times before finally quitting. You may have symptoms of withdrawal because your body is used to nicotine. You may crave cigarettes, be irritable, feel very hungry, cough often, get headaches, or have difficulty concentrating. The withdrawal symptoms are only temporary. They are strongest when you first quit, but they will go away within 10-14 days. To reduce the chances of relapse, try to:  Avoid drinking alcohol. Drinking lowers  your chances of successfully quitting.  Reduce the amount of caffeine you consume. Once you quit smoking, the amount of caffeine in your body increases and can give you symptoms, such as a rapid heartbeat, sweating, and anxiety.  Avoid smokers because they can make you want to smoke.  Do not let weight gain distract you. Many smokers will gain weight when they quit, usually less than 10 pounds. Eat a healthy diet and stay active. You can always lose the weight gained after you quit.  Find ways to improve your mood other than smoking. FOR MORE INFORMATION  www.smokefree.gov  Document Released: 08/11/2001 Document Revised: 01/01/2014 Document Reviewed: 11/26/2011 [facility] Patient Information 2015 [place], [place]. This information is not intended to replace advice given to you by your health care provider. Make sure you discuss any questions  you have with your health care provider.

## 2014-10-09 NOTE — Telephone Encounter (Signed)
Pt is asking for a replacement glucose meter.Meter is True Result by [employer] [phone]). Please follow up with pt.

## 2014-10-09 NOTE — Progress Notes (Signed)
Patient here for follow up Complains of bilateral feet swelling Also complains of having a feeling of pins and needles in his hands and feet Has been waking up during the night and vomiting Patient is on celexa and having problems with ED Complains of sore throat Requesting a referral to GI for endoscopy

## 2014-10-09 NOTE — Progress Notes (Signed)
MRN: [id number] Name: [name] [name]  Sex: male Age: 54 y.o. DOB: [birth year]  Allergies: Review of patient's allergies indicates no known allergies.  Chief Complaint  Patient presents with  . Follow-up    HPI: Patient is 54 y.o. male who has history of diabetes as per patient recently has not been taking Glucotrol, his hemoglobin A1c noticed to have trended up, I have constipation compliance with the medication, he also history of chronic GERD symptoms nausea vomiting and sometimes he has sore throat, patient is requesting referral to see GI for these symptoms, also history of anxiety/depression following up with [name], previous blood work reviewed noticed hepatitis C antibody test positive, patient has history of drug abuse in the past.is also complaining of numbness tingling in his feet.  Past Medical History  Diagnosis Date  . Depression   . Diabetes     Past Surgical History  Procedure Laterality Date  . Appendectomy    . Tonsillectomy    . Colonoscopy  01/2014  . Colonoscopy N/A 02/16/2014    Procedure: COLONOSCOPY;  Surgeon: Gatha Mayer, MD;  Location: [facility];  Service: Endoscopy;  Laterality: N/A;      Medication List       This list is accurate as of: 10/09/14 11:24 AM.  Always use your most recent med list.               citalopram 20 MG tablet  Commonly known as:  CELEXA  Take 20 mg by mouth daily.     gabapentin 300 MG capsule  Commonly known as:  NEURONTIN  Take 1 capsule (300 mg total) by mouth 3 (three) times daily.     glipiZIDE 10 MG tablet  Commonly known as:  GLUCOTROL  Take 2.5-5 mg by mouth daily before breakfast. 5 mg in the morning and 2.5 mg at 1800     metoCLOPramide 10 MG tablet  Commonly known as:  REGLAN  Take 1 tablet (10 mg total) by mouth every 8 (eight) hours as needed for nausea. Take one before hs and AM for stomach     ranitidine 75 MG tablet  Commonly known as:  ZANTAC 75  Take 1 tablet (75 mg total) by mouth at  bedtime.     tadalafil 20 MG tablet  Commonly known as:  CIALIS  Take 0.5-1 tablets (10-20 mg total) by mouth daily as needed for erectile dysfunction.     zolpidem 10 MG tablet  Commonly known as:  AMBIEN  Take 10 mg by mouth at bedtime as needed for sleep. At 2000        Meds ordered this encounter  Medications  . gabapentin (NEURONTIN) 300 MG capsule    Sig: Take 1 capsule (300 mg total) by mouth 3 (three) times daily.    Dispense:  90 capsule    Refill:  3  . ranitidine (ZANTAC 75) 75 MG tablet    Sig: Take 1 tablet (75 mg total) by mouth at bedtime.    Dispense:  30 tablet    Refill:  3  . metoCLOPramide (REGLAN) 10 MG tablet    Sig: Take 1 tablet (10 mg total) by mouth every 8 (eight) hours as needed for nausea. Take one before hs and AM for stomach    Dispense:  30 tablet    Refill:  0    Immunization History  Administered Date(s) Administered  . Pneumococcal Polysaccharide-23 02/16/2014    Family History  Problem Relation Age of  Onset  . Cancer Mother   . Diabetes Mother   . Colon cancer Neg Hx     History  Substance Use Topics  . Smoking status: Current Every Day Smoker -- 0.50 packs/day for 35 years  . Smokeless tobacco: Never Used  . Alcohol Use: 10.5 oz/week    21 drink(s) per week     Comment: 21 beers a week    Review of Systems   As noted in HPI  Filed Vitals:   10/09/14 1008  BP: 144/88  Pulse: 83  Temp: 98 F (36.7 C)  Resp: 16    Physical Exam  Physical Exam  Constitutional: No distress.  Eyes: EOM are normal. Pupils are equal, round, and reactive to light.  Cardiovascular: Normal rate and regular rhythm.   Pulmonary/Chest: Breath sounds normal. No respiratory distress. He has no wheezes. He has no rales.  Abdominal: Soft. He exhibits no distension. There is no tenderness. There is no rebound.  Musculoskeletal: He exhibits no edema.    CBC    Component Value Date/Time   WBC 13.9* 02/20/2014 1409   RBC 3.75* 02/20/2014  1409   HGB 12.2* 02/20/2014 1409   HCT 35.6* 02/20/2014 1409   PLT 293.0 02/20/2014 1409   MCV 95.0 02/20/2014 1409   LYMPHSABS 3.5 02/20/2014 1409   MONOABS 1.3* 02/20/2014 1409   EOSABS 0.3 02/20/2014 1409   BASOSABS 0.1 02/20/2014 1409    CMP     Component Value Date/Time   NA 139 04/11/2014 1218   K 4.7 04/11/2014 1218   CL 103 04/11/2014 1218   CO2 25 04/11/2014 1218   GLUCOSE 129* 04/11/2014 1218   BUN 11 04/11/2014 1218   CREATININE 0.85 04/11/2014 1218   CREATININE 0.76 02/15/2014 1226   CALCIUM 9.8 04/11/2014 1218   PROT 7.5 12/21/2013 0934   ALBUMIN 4.4 12/21/2013 0934   AST 28 12/21/2013 0934   ALT 30 12/21/2013 0934   ALKPHOS 74 12/21/2013 0934   BILITOT 0.8 12/21/2013 0934   GFRNONAA >90 02/15/2014 1226   GFRNONAA >89 09/28/2013 1552   GFRAA >90 02/15/2014 1226   GFRAA >89 09/28/2013 1552    Lab Results  Component Value Date/Time   CHOL 184 12/21/2013 09:34 AM    No components found for: HGA1C  Lab Results  Component Value Date/Time   AST 28 12/21/2013 09:34 AM    Assessment and Plan  Other specified diabetes mellitus without complications - Plan:  Results for orders placed or performed in visit on 10/09/14  Glucose (CBG)  Result Value Ref Range   POC Glucose 160 (A) 70 - 99 mg/dl  HgB A1c  Result Value Ref Range   Hemoglobin A1C 6.80   Rapid Strep A  Result Value Ref Range   Rapid Strep A Screen Negative Negative  Hemoglobin A1c has trended up, have advised patient for compliance with the medications  HgB A1c, COMPLETE METABOLIC PANEL WITH GFR, Vit D  25 hydroxy (rtn osteoporosis monitoring)  Sore throat - Plan: Rapid Strep A Is negative, was send Throat culture ([facility])  Hepatitis C antibody test positive - Plan:Advise patient to avoid alcohol, Hepatitis C RNA quantitative  Nausea and vomiting in adult - Plan: metoCLOPramide (REGLAN) 10 MG tablet  Gastroesophageal reflux disease, esophagitis presence not specified - Plan: ranitidine  (ZANTAC 75) 75 MG tablet, Ambulatory referral to Gastroenterology  Numbness and tingling - Plan: gabapentin (NEURONTIN) 300 MG capsule  Tobacco use disorder Consultation to quit smoking.  Anxiety state Following up  with [name]  Erectile dysfunction, unspecified erectile dysfunction type   Health Maintenance -Colonoscopy:Up-to-date with colonoscopy   Return in about 3 months (around 01/07/2015) for diabetes.  Lorayne Marek, MD

## 2014-10-10 LAB — HEPATITIS C RNA QUANTITATIVE
HCV Quantitative Log: 6.64 {Log} — ABNORMAL HIGH (ref <1.18)
HCV Quantitative: 4354411 IU/mL — ABNORMAL HIGH (ref <15)

## 2014-10-10 LAB — VITAMIN D 25 HYDROXY (VIT D DEFICIENCY, FRACTURES): VIT D 25 HYDROXY: 33 ng/mL (ref 30–100)

## 2014-10-11 ENCOUNTER — Telehealth: Payer: Self-pay

## 2014-10-11 DIAGNOSIS — B1920 Unspecified viral hepatitis C without hepatic coma: Secondary | ICD-10-CM

## 2014-10-11 LAB — CULTURE, GROUP A STREP: Organism ID, Bacteria: NORMAL

## 2014-10-11 NOTE — Telephone Encounter (Signed)
-----   Message from Lorayne Marek, MD sent at 10/11/2014  9:13 AM EST ----- Call and let the patient know that his hepatitis C quantitative test reported high viral load , advise patient to schedule appointment with infectious disease specialist, put in the referral.

## 2014-10-11 NOTE — Telephone Encounter (Signed)
Patient is aware of his lab results Referral to ID placed in epic

## 2014-10-16 ENCOUNTER — Other Ambulatory Visit (INDEPENDENT_AMBULATORY_CARE_PROVIDER_SITE_OTHER): Payer: No Typology Code available for payment source

## 2014-10-16 DIAGNOSIS — B182 Chronic viral hepatitis C: Secondary | ICD-10-CM

## 2014-10-16 LAB — CBC WITH DIFFERENTIAL/PLATELET
BASOS PCT: 1 % (ref 0–1)
Basophils Absolute: 0.1 10*3/uL (ref 0.0–0.1)
Eosinophils Absolute: 0.2 10*3/uL (ref 0.0–0.7)
Eosinophils Relative: 3 % (ref 0–5)
HEMATOCRIT: 46.8 % (ref 39.0–52.0)
HEMOGLOBIN: 15.4 g/dL (ref 13.0–17.0)
LYMPHS ABS: 2.4 10*3/uL (ref 0.7–4.0)
LYMPHS PCT: 31 % (ref 12–46)
MCH: 31.2 pg (ref 26.0–34.0)
MCHC: 32.9 g/dL (ref 30.0–36.0)
MCV: 94.9 fL (ref 78.0–100.0)
MONOS PCT: 10 % (ref 3–12)
MPV: 10.1 fL (ref 8.6–12.4)
Monocytes Absolute: 0.8 10*3/uL (ref 0.1–1.0)
NEUTROS ABS: 4.2 10*3/uL (ref 1.7–7.7)
NEUTROS PCT: 55 % (ref 43–77)
Platelets: 229 10*3/uL (ref 150–400)
RBC: 4.93 MIL/uL (ref 4.22–5.81)
RDW: 12.8 % (ref 11.5–15.5)
WBC: 7.6 10*3/uL (ref 4.0–10.5)

## 2014-10-17 LAB — PROTIME-INR
INR: 1.05 (ref <1.50)
Prothrombin Time: 13.7 seconds (ref 11.6–15.2)

## 2014-10-17 LAB — IRON: Iron: 98 ug/dL (ref 42–165)

## 2014-10-17 LAB — HIV ANTIBODY (ROUTINE TESTING W REFLEX): HIV: NONREACTIVE

## 2014-10-17 LAB — HEPATITIS A ANTIBODY, TOTAL: HEP A TOTAL AB: NONREACTIVE

## 2014-10-17 LAB — HEPATITIS B CORE ANTIBODY, TOTAL: HEP B C TOTAL AB: NONREACTIVE

## 2014-10-17 LAB — HEPATITIS B SURFACE ANTIBODY,QUALITATIVE: Hep B S Ab: NEGATIVE

## 2014-10-17 LAB — ANA: Anti Nuclear Antibody(ANA): NEGATIVE

## 2014-10-17 LAB — HEPATITIS B SURFACE ANTIGEN: Hepatitis B Surface Ag: NEGATIVE

## 2014-10-19 LAB — HEPATITIS C GENOTYPE

## 2014-10-23 ENCOUNTER — Encounter (HOSPITAL_COMMUNITY): Payer: Self-pay | Admitting: Emergency Medicine

## 2014-10-23 ENCOUNTER — Emergency Department (HOSPITAL_COMMUNITY)
Admission: EM | Admit: 2014-10-23 | Discharge: 2014-10-23 | Disposition: A | Discharge status: Home / Self Care | Payer: No Typology Code available for payment source | Source: Home / Self Care | Attending: Family Medicine | Admitting: Family Medicine

## 2014-10-23 ENCOUNTER — Emergency Department (INDEPENDENT_AMBULATORY_CARE_PROVIDER_SITE_OTHER): Payer: No Typology Code available for payment source

## 2014-10-23 DIAGNOSIS — J40 Bronchitis, not specified as acute or chronic: Secondary | ICD-10-CM

## 2014-10-23 MED ORDER — ALBUTEROL SULFATE HFA 108 (90 BASE) MCG/ACT IN AERS: 2.0000 | INHALATION_SPRAY | Freq: Four times a day (QID) | RESPIRATORY_TRACT | Status: DC | PRN

## 2014-10-23 MED ORDER — IPRATROPIUM BROMIDE 0.06 % NA SOLN: 2.0000 | Freq: Four times a day (QID) | NASAL | Status: DC

## 2014-10-23 MED ORDER — PREDNISONE 10 MG PO TABS: 30.0000 mg | ORAL_TABLET | Freq: Every day | ORAL | Status: DC

## 2014-10-23 MED ORDER — IPRATROPIUM-ALBUTEROL 0.5-2.5 (3) MG/3ML IN SOLN
RESPIRATORY_TRACT | Status: AC
  Filled 2014-10-23: qty 3

## 2014-10-23 MED ORDER — IPRATROPIUM-ALBUTEROL 0.5-2.5 (3) MG/3ML IN SOLN
3.0000 mL | Freq: Once | RESPIRATORY_TRACT | Status: AC
  Administered 2014-10-23: 3 mL via RESPIRATORY_TRACT

## 2014-10-23 NOTE — Discharge Instructions (Signed)
Thank you for coming in today. Call or go to the emergency room if you get worse, have trouble breathing, have chest pains, or palpitations.  Please quit smoking.   Acute Bronchitis Bronchitis is inflammation of the airways that extend from the windpipe into the lungs (bronchi). The inflammation often causes mucus to develop. This leads to a cough, which is the most common symptom of bronchitis.  In acute bronchitis, the condition usually develops suddenly and goes away over time, usually in a couple weeks. Smoking, allergies, and asthma can make bronchitis worse. Repeated episodes of bronchitis may cause further lung problems.  CAUSES Acute bronchitis is most often caused by the same virus that causes a cold. The virus can spread from person to person (contagious) through coughing, sneezing, and touching contaminated objects. SIGNS AND SYMPTOMS   Cough.   Fever.   Coughing up mucus.   Body aches.   Chest congestion.   Chills.   Shortness of breath.   Sore throat.  DIAGNOSIS  Acute bronchitis is usually diagnosed through a physical exam. Your health care provider will also ask you questions about your medical history. Tests, such as chest X-rays, are sometimes done to rule out other conditions.  TREATMENT  Acute bronchitis usually goes away in a couple weeks. Oftentimes, no medical treatment is necessary. Medicines are sometimes given for relief of fever or cough. Antibiotic medicines are usually not needed but may be prescribed in certain situations. In some cases, an inhaler may be recommended to help reduce shortness of breath and control the cough. A cool mist vaporizer may also be used to help thin bronchial secretions and make it easier to clear the chest.  HOME CARE INSTRUCTIONS  Get plenty of rest.   Drink enough fluids to keep your urine clear or pale yellow (unless you have a medical condition that requires fluid restriction). Increasing fluids may help thin your  respiratory secretions (sputum) and reduce chest congestion, and it will prevent dehydration.   Take medicines only as directed by your health care provider.  If you were prescribed an antibiotic medicine, finish it all even if you start to feel better.  Avoid smoking and secondhand smoke. Exposure to cigarette smoke or irritating chemicals will make bronchitis worse. If you are a smoker, consider using nicotine gum or skin patches to help control withdrawal symptoms. Quitting smoking will help your lungs heal faster.   Reduce the chances of another bout of acute bronchitis by washing your hands frequently, avoiding people with cold symptoms, and trying not to touch your hands to your mouth, nose, or eyes.   Keep all follow-up visits as directed by your health care provider.  SEEK MEDICAL CARE IF: Your symptoms do not improve after 1 week of treatment.  SEEK IMMEDIATE MEDICAL CARE IF:  You develop an increased fever or chills.   You have chest pain.   You have severe shortness of breath.  You have bloody sputum.   You develop dehydration.  You faint or repeatedly feel like you are going to pass out.  You develop repeated vomiting.  You develop a severe headache. MAKE SURE YOU:   Understand these instructions.  Will watch your condition.  Will get help right away if you are not doing well or get worse. Document Released: 09/24/2004 Document Revised: 01/01/2014 Document Reviewed: 02/07/2013 [facility] Patient Information 2015 [place], [place]. This information is not intended to replace advice given to you by your health care provider. Make sure you discuss any questions  you have with your health care provider.

## 2014-10-23 NOTE — ED Notes (Signed)
Delay in discharge: patient requested cd of chest film today.  Radiology assisted with request.

## 2014-10-23 NOTE — ED Provider Notes (Signed)
[name] [name] is a 54 y.o. male who presents to Urgent Care today for cough sore throat chest soreness shortness of breath and chest tightness. Patient notes bilateral non-radiating nonexertional pain. The pain in his chest is worse with cough and deep breath. The symptoms are consistent with previous episodes of bronchitis. No vomiting or diarrhea.   Past Medical History  Diagnosis Date  . Depression   . Diabetes    Past Surgical History  Procedure Laterality Date  . Appendectomy    . Tonsillectomy    . Colonoscopy  01/2014  . Colonoscopy N/A 02/16/2014    Procedure: COLONOSCOPY;  Surgeon: Gatha Mayer, MD;  Location: [facility];  Service: Endoscopy;  Laterality: N/A;   History  Substance Use Topics  . Smoking status: Current Every Day Smoker -- 0.50 packs/day for 35 years  . Smokeless tobacco: Never Used  . Alcohol Use: 10.5 oz/week    21 drink(s) per week     Comment: 21 beers a week   ROS as above Medications: No current facility-administered medications for this encounter.   Current Outpatient Prescriptions  Medication Sig Dispense Refill  . albuterol (PROVENTIL HFA;VENTOLIN HFA) 108 (90 BASE) MCG/ACT inhaler Inhale 2 puffs into the lungs every 6 (six) hours as needed for wheezing or shortness of breath. 1 Inhaler 2  . citalopram (CELEXA) 20 MG tablet Take 20 mg by mouth daily.    Marland Kitchen gabapentin (NEURONTIN) 300 MG capsule Take 1 capsule (300 mg total) by mouth 3 (three) times daily. 90 capsule 3  . glipiZIDE (GLUCOTROL) 10 MG tablet Take 2.5-5 mg by mouth daily before breakfast. 5 mg in the morning and 2.5 mg at 1800    . ipratropium (ATROVENT) 0.06 % nasal spray Place 2 sprays into both nostrils 4 (four) times daily. 15 mL 1  . metoCLOPramide (REGLAN) 10 MG tablet Take 1 tablet (10 mg total) by mouth every 8 (eight) hours as needed for nausea. Take one before hs and AM for stomach 30 tablet 0  . predniSONE (DELTASONE) 10 MG tablet Take 3 tablets (30 mg total) by mouth  daily. 15 tablet 0  . ranitidine (ZANTAC 75) 75 MG tablet Take 1 tablet (75 mg total) by mouth at bedtime. 30 tablet 3  . tadalafil (CIALIS) 20 MG tablet Take 0.5-1 tablets (10-20 mg total) by mouth daily as needed for erectile dysfunction. 120 tablet 2  . zolpidem (AMBIEN) 10 MG tablet Take 10 mg by mouth at bedtime as needed for sleep. At 2000     No Known Allergies   Exam:  BP 150/94 mmHg  Pulse 91  Temp(Src) 99.2 F (37.3 C) (Oral)  Resp 16  SpO2 97% Gen: Well NAD HEENT: EOMI,  MMM posterior pharynx with cobblestoning. Normal tympanic membranes bilaterally Lungs: Normal work of breathing. CTABL Heart: RRR no MRG Abd: NABS, Soft. Nondistended, Nontender Exts: Brisk capillary refill, warm and well perfused.   Patient was given a 2.5/0.5 mg DuoNeb nebulizer treatment, and felt better  Twelve-lead EKG shows normal sinus rhythm at 88 bpm. No ST segment elevation or depression. No significant Q waves. QTC 435. Will EKG  No results found for this or any previous visit (from the past 24 hour(s)). Dg Chest 2 View  10/23/2014   CLINICAL DATA:  Fever, cough.  EXAM: CHEST  2 VIEW  COMPARISON:  December 21, 2013.  FINDINGS: The heart size and mediastinal contours are within normal limits. Both lungs are clear. No pneumothorax or plural effusion is noted.  The visualized skeletal structures are unremarkable.  IMPRESSION: No acute cardiopulmonary abnormality seen.   Electronically Signed   By: Marijo Conception, M.D.   On: 10/23/2014 14:09    Assessment and Plan: 54 y.o. male with bronchitis likely due to virus, located by smoking. Patient may have underlying COPD. Treat with prednisone, Atrovent nasal spray, and albuterol. Return as needed.  Discussed warning signs or symptoms. Please see discharge instructions. Patient expresses understanding.     Gregor Hams, MD 10/23/14 [phone]

## 2014-10-23 NOTE — ED Notes (Signed)
Cough, sore throat, chest sore with coughing.  History of bronchitis and says this feels the same

## 2014-10-24 ENCOUNTER — Other Ambulatory Visit: Payer: Self-pay | Admitting: *Deleted

## 2014-10-24 MED ORDER — GLUCOSE BLOOD VI STRP: ORAL_STRIP | Status: DC

## 2014-10-24 MED ORDER — FREESTYLE SYSTEM KIT: 1.0000 | PACK | Status: DC | PRN

## 2014-10-24 MED ORDER — FREESTYLE LANCETS MISC: Status: DC

## 2014-10-30 DIAGNOSIS — K449 Diaphragmatic hernia without obstruction or gangrene: Secondary | ICD-10-CM

## 2014-10-30 HISTORY — DX: Diaphragmatic hernia without obstruction or gangrene: K44.9

## 2014-10-31 ENCOUNTER — Ambulatory Visit (INDEPENDENT_AMBULATORY_CARE_PROVIDER_SITE_OTHER): Payer: No Typology Code available for payment source | Admitting: Internal Medicine

## 2014-10-31 ENCOUNTER — Telehealth: Payer: Self-pay | Admitting: *Deleted

## 2014-10-31 ENCOUNTER — Encounter: Payer: Self-pay | Admitting: Internal Medicine

## 2014-10-31 VITALS — BP 148/89 | HR 101 | Temp 99.4°F | Ht 71.0 in | Wt 185.0 lb

## 2014-10-31 DIAGNOSIS — B182 Chronic viral hepatitis C: Secondary | ICD-10-CM

## 2014-10-31 DIAGNOSIS — Z23 Encounter for immunization: Secondary | ICD-10-CM

## 2014-10-31 HISTORY — DX: Chronic viral hepatitis C: B18.2

## 2014-10-31 MED ORDER — LEDIPASVIR-SOFOSBUVIR 90-400 MG PO TABS: 1.0000 | ORAL_TABLET | Freq: Every day | ORAL | Status: DC

## 2014-10-31 NOTE — Telephone Encounter (Signed)
Patient is scheduled for elastography on 3/23 9:00 (pt to arrive 8:45) [facility] Rad[facility].  Pt to be NPO for 6 hours prior to procedure. No PA required for [facility] orange card. Pt notified at appointment today. Landis Gandy, RN

## 2014-10-31 NOTE — Patient Instructions (Addendum)
Date 10/31/14  Dear [name] [name], As discussed in the [facility], your hepatitis C therapy will include the following medications:          Harvoni 90mg /400mg  tablet:           Take 1 tablet by mouth once daily   Please note that ALL MEDICATIONS WILL START ON THE SAME DATE for a total of 12 weeks. ---------------------------------------------------------------- Your HCV Treatment Start Date: TBA   Your HCV genotype:  1a    Liver Fibrosis: TBD    ----------------------------------------------------------------   Please always contact your pharmacy at least 3-4 business days before you run out of medications to ensure your next month's medication is ready or 1 week prior to running out if you receive it by mail.  Remember, each prescription is for 28 days. ---------------------------------------------------------------- GENERAL NOTES REGARDING YOUR HEPATITIS C MEDICATION:  SOFOSBUVIR/LEDIPASVIR (HARVONI): - Harvoni tablet is taken daily with OR without food. - The tablets are orange. - The tablets should be stored at room temperature.  - Acid reducing agents such as H2 blockers (ie. Pepcid (famotidine), Zantac (ranitidine), Tagamet (cimetidine), Axid (nizatidine) and proton pump inhibitors (ie. Prilosec (omeprazole), Protonix (pantoprazole), Nexium (esomeprazole), or Aciphex (rabeprazole)) can decrease effectiveness of Harvoni. Do not take until you have discussed with a health care provider.    -Antacids that contain magnesium and/or aluminum hydroxide (ie. Milk of Magensia, Rolaids, Gaviscon, Maalox, Mylanta, an dArthritis Pain Formula)can reduce absorption of Harvoni, so take them at least 4 hours before or after Harvoni.  -Calcium carbonate (calcium supplements or antacids such as Tums, Caltrate, Os-Cal)needs to be taken at least 4 hours hours before or after Harvoni.  -St. John's wort or any products that contain St. John's wort like some herbal supplements  Please inform the office  prior to starting any of these medications.  - The common side effects with Harvoni:      1. Fatigue      2. Headache      3. Nausea      4. Diarrhea      5. Insomnia   Support Path is a suite of resources designed to help patients start with HARVONI and move toward treatment completion [place] helps patients access therapy and get off to an efficient start  Benefits investigation and prior authorization support Co-pay and other financial assistance A specialty pharmacy finder CO-PAY COUPON The [place] co-pay coupon may help eligible patients lower their out-of-pocket costs. With a co-pay coupon, most eligible patients may pay no more than $5 per co-pay (restrictions apply) www.harvoni.com call ([phone] [phone] Not valid for patients enrolled in government healthcare prescription drug programs, such as Medicare Part D and Medicaid. Patients in the coverage gap known as the "donut hole" also are not eligible The HARVONI co-pay coupon program will cover the out-of-pocket costs for HARVONI prescriptions up to a maximum of 25% of the catalog price of a 12-week regimen of HARVONI  Please note that this only lists the most common side effects and is NOT a comprehensive list of the potential side effects of these medications. For more information, please review the drug information sheets that come with your medication package from the pharmacy.  ---------------------------------------------------------------- GENERAL HELPFUL HINTS ON HCV THERAPY: 1. No alcohol. 2. Protect against sun-sensitivity/sunburns (wear sunglasses, hat, long sleeves, pants and sunscreen). 3. Stay well-hydrated/well-moisturized. 4. Notify the [facility] of any changes in your other over-the-counter/herbal or prescription medications. 5. If you miss a dose of your medication, take the  missed dose as soon as you remember. Return to your regular time/dose schedule the next day.  6.  Do not stop taking  your medications without first talking with your healthcare provider. 7.  You may take Tylenol (acetaminophen), as long as the dose is less than 2000 mg (OR no more than 4 tablets of the Tylenol Extra Strengths 500mg  tablet) in 24 hours. 8.  You will need to obtain routine labs and/or office visits at [facility] at weeks 2, 4, 8,  and 12 as well as 12 and 24 weeks after completion of treatment.   Scharlene Gloss, [place] for [place], [place]  [id number] [phone]

## 2014-10-31 NOTE — Progress Notes (Signed)
+[name] [name] is a 54 y.o. male who presents for initial evaluation and management of a positive Hepatitis C antibody test.  Patient tested positive last year after seeing a commercial. Hepatitis C risk factors present are: remote history of snorting drugs. Patient denies history of blood transfusion, IV drug abuse, multiple sexual partners, renal dialysis, sexual contact with person with liver disease, tattoos. Patient has had other studies performed. Results: hepatitis C RNA by PCR, result: positive. Patient has not had prior treatment for Hepatitis C. Patient does not have a past history of liver disease. Patient does not have a family history of liver disease.   HPI: He does drink 2-3 beers daily, no drug use.    Patient does not have documented immunity to Hepatitis A. Patient does not have documented immunity to Hepatitis B.     Review of Systems A comprehensive review of systems was negative.   Past Medical History  Diagnosis Date  . Depression   . Diabetes     Prior to Admission medications   Medication Sig Start Date End Date Taking? Authorizing Provider  albuterol (PROVENTIL HFA;VENTOLIN HFA) 108 (90 BASE) MCG/ACT inhaler Inhale 2 puffs into the lungs every 6 (six) hours as needed for wheezing or shortness of breath. 10/23/14   Gregor Hams, MD  citalopram (CELEXA) 20 MG tablet Take 20 mg by mouth daily.    Historical Provider, MD  gabapentin (NEURONTIN) 300 MG capsule Take 1 capsule (300 mg total) by mouth 3 (three) times daily. 10/09/14   Lorayne Marek, MD  glipiZIDE (GLUCOTROL) 10 MG tablet Take 2.5-5 mg by mouth daily before breakfast. 5 mg in the morning and 2.5 mg at 1800    Historical Provider, MD  glucose blood test strip Use as instructed 10/24/14   Lorayne Marek, MD  glucose monitoring kit (FREESTYLE) monitoring kit 1 each by Does not apply route as needed for other. Used as directed 10/24/14   Lorayne Marek, MD  ipratropium (ATROVENT) 0.06 % nasal spray Place 2 sprays into  both nostrils 4 (four) times daily. 10/23/14   Gregor Hams, MD  Lancets (FREESTYLE) lancets Use as instructed 10/24/14   Lorayne Marek, MD  metoCLOPramide (REGLAN) 10 MG tablet Take 1 tablet (10 mg total) by mouth every 8 (eight) hours as needed for nausea. Take one before hs and AM for stomach 10/09/14   Lorayne Marek, MD  predniSONE (DELTASONE) 10 MG tablet Take 3 tablets (30 mg total) by mouth daily. 10/23/14   Gregor Hams, MD  ranitidine (ZANTAC 75) 75 MG tablet Take 1 tablet (75 mg total) by mouth at bedtime. 10/09/14   Lorayne Marek, MD  tadalafil (CIALIS) 20 MG tablet Take 0.5-1 tablets (10-20 mg total) by mouth daily as needed for erectile dysfunction. 12/22/13   Tresa Garter, MD  zolpidem (AMBIEN) 10 MG tablet Take 10 mg by mouth at bedtime as needed for sleep. At [place] Provider, MD    No Known Allergies  History  Substance Use Topics  . Smoking status: Current Every Day Smoker -- 0.50 packs/day for 35 years  . Smokeless tobacco: Never Used  . Alcohol Use: 10.5 oz/week    21 drink(s) per week     Comment: 21 beers a week    Family History  Problem Relation Age of Onset  . Cancer Mother   . Diabetes Mother   . Colon cancer Neg Hx       Objective:  There were no  vitals filed for this visit. in no apparent distress and alert HEENT: anicteric Cor RRR and No murmurs clear Bowel sounds are normal, liver is not enlarged, spleen is not enlarged peripheral pulses normal, no pedal edema, no clubbing or cyanosis negative for - jaundice, spider hemangioma, telangiectasia, palmar erythema, ecchymosis and atrophy  Laboratory Genotype:  Lab Results  Component Value Date   HCVGENOTYPE 1a 10/16/2014   HCV viral load:  Lab Results  Component Value Date   HCVQUANT [id number]* 10/09/2014   Lab Results  Component Value Date   WBC 7.6 10/16/2014   HGB 15.4 10/16/2014   HCT 46.8 10/16/2014   MCV 94.9 10/16/2014   PLT 229 10/16/2014    Lab Results  Component  Value Date   CREATININE 0.73 10/09/2014   BUN 10 10/09/2014   NA 135 10/09/2014   K 5.1 10/09/2014   CL 105 10/09/2014   CO2 27 10/09/2014    Lab Results  Component Value Date   ALT 33 10/09/2014   AST 28 10/09/2014   ALKPHOS 73 10/09/2014   BILITOT 0.7 10/09/2014   INR 1.05 10/16/2014      Assessment: Chronic Hepatitis C genotype 1a  Plan: 1) Patient counseled extensively on limiting acetaminophen to no more than 2 grams daily, avoidance of alcohol. 2) Transmission discussed with patient including sexual transmission, sharing razors and toothbrush.   3) Will need referral to gastroenterology if concern for cirrhosis 4) Will need referral for substance abuse counseling: No. 5) Will prescribe Harvoni for 12 weeks through [place] 6) Hepatitis A vaccine Yes.   7) Hepatitis B vaccine Yes.   8) Pneumovax vaccine if concern for cirrhosis 9) will follow up after elastography

## 2014-11-01 NOTE — Addendum Note (Signed)
Addended by: Landis Gandy on: 11/01/2014 03:52 PM   Modules accepted: Orders

## 2014-11-15 ENCOUNTER — Ambulatory Visit: Payer: No Typology Code available for payment source | Attending: Internal Medicine

## 2014-11-15 ENCOUNTER — Ambulatory Visit: Payer: No Typology Code available for payment source

## 2014-11-21 ENCOUNTER — Ambulatory Visit (HOSPITAL_COMMUNITY)
Admission: RE | Admit: 2014-11-21 | Discharge: 2014-11-21 | Disposition: A | Discharge status: Home / Self Care | Payer: No Typology Code available for payment source | Source: Ambulatory Visit | Attending: Internal Medicine | Admitting: Internal Medicine

## 2014-11-21 DIAGNOSIS — B182 Chronic viral hepatitis C: Secondary | ICD-10-CM

## 2014-11-28 ENCOUNTER — Ambulatory Visit (INDEPENDENT_AMBULATORY_CARE_PROVIDER_SITE_OTHER): Payer: Self-pay | Admitting: Gastroenterology

## 2014-11-28 ENCOUNTER — Ambulatory Visit (INDEPENDENT_AMBULATORY_CARE_PROVIDER_SITE_OTHER): Payer: Self-pay | Admitting: *Deleted

## 2014-11-28 ENCOUNTER — Encounter: Payer: Self-pay | Admitting: Gastroenterology

## 2014-11-28 VITALS — BP 154/90 | HR 92 | Ht 70.25 in | Wt 182.2 lb

## 2014-11-28 DIAGNOSIS — B182 Chronic viral hepatitis C: Secondary | ICD-10-CM

## 2014-11-28 DIAGNOSIS — R1032 Left lower quadrant pain: Secondary | ICD-10-CM

## 2014-11-28 DIAGNOSIS — Z23 Encounter for immunization: Secondary | ICD-10-CM

## 2014-11-28 DIAGNOSIS — R110 Nausea: Secondary | ICD-10-CM

## 2014-11-28 MED ORDER — OMEPRAZOLE-SODIUM BICARBONATE 40-1100 MG PO CAPS: 1.0000 | ORAL_CAPSULE | Freq: Every day | ORAL | Status: DC

## 2014-11-28 NOTE — Assessment & Plan Note (Signed)
Transient sharp left lower quadrant pain.  He clearly improved after stopping metformin.

## 2014-11-28 NOTE — Addendum Note (Signed)
Addended by: Oda Kilts on: 11/28/2014 09:36 AM   Modules accepted: Orders

## 2014-11-28 NOTE — Patient Instructions (Addendum)
You have been scheduled for an endoscopy. Please follow written instructions given to you at your visit today. If you use inhalers (even only as needed), please bring them with you on the day of your procedure. Your physician has requested that you go to www.startemmi.com and enter the access code given to you at your visit today. This web site gives a general overview about your procedure. However, you should still follow specific instructions given to you by our office regarding your preparation for the procedure.  Stop Zantac We will send in Zegerid to your pharmacy

## 2014-11-28 NOTE — Progress Notes (Signed)
      History of Present Illness:  [name] [name] has returned for evaluation of nausea.  He has frequent nausea, especially at bedtime and afterwards.  He takes Reglan intermittently as well as Zantac.  He denies pyrosis.  He has diabetes.  He was recently diagnosed with hepatitis C    Review of Systems: Pertinent positive and negative review of systems were noted in the above HPI section. All other review of systems were otherwise negative.    Current Medications, Allergies, Past Medical History, Past Surgical History, Family History and Social History were reviewed in [facility] record  Vital signs were reviewed in today's medical record. Physical Exam: General: Well developed , well nourished, no acute distress Skin: anicteric Head: Normocephalic and atraumatic Eyes:  sclerae anicteric, EOMI Ears: Normal auditory acuity Mouth: No deformity or lesions Lungs: Clear throughout to auscultation Heart: Regular rate and rhythm; no murmurs, rubs or bruits Abdomen: Soft, non tender and non distended. No masses, hepatosplenomegaly or hernias noted. Normal Bowel sounds.  There is no succussion splash Rectal:deferred Musculoskeletal: Symmetrical with no gross deformities  Pulses:  Normal pulses noted Extremities: No clubbing, cyanosis, edema or deformities noted Neurological: Alert oriented x 4, grossly nonfocal Psychological:  Alert and cooperative. Normal mood and affect  See Assessment and Plan under Problem List

## 2014-11-28 NOTE — Assessment & Plan Note (Signed)
Tattoos may be due to ulcer or nonulcer dyspepsia or gastroparesis.  Recommendations #1 upper endoscopy #2 DC Zantac; begin Zegerid daily at bedtime tree to consider gastric emptying scan pending results of above

## 2014-11-28 NOTE — Assessment & Plan Note (Signed)
Patient is scheduled to see ID for treatment

## 2014-11-29 ENCOUNTER — Encounter: Payer: Self-pay | Admitting: Gastroenterology

## 2014-11-29 ENCOUNTER — Ambulatory Visit (AMBULATORY_SURGERY_CENTER): Payer: Self-pay | Admitting: Gastroenterology

## 2014-11-29 VITALS — BP 119/70 | HR 72 | Temp 96.7°F | Resp 35 | Ht 70.25 in | Wt 182.0 lb

## 2014-11-29 DIAGNOSIS — K209 Esophagitis, unspecified without bleeding: Secondary | ICD-10-CM

## 2014-11-29 DIAGNOSIS — K2270 Barrett's esophagus without dysplasia: Secondary | ICD-10-CM

## 2014-11-29 DIAGNOSIS — R110 Nausea: Secondary | ICD-10-CM

## 2014-11-29 LAB — GLUCOSE, CAPILLARY
GLUCOSE-CAPILLARY: 118 mg/dL — AB (ref 70–99)
Glucose-Capillary: 119 mg/dL — ABNORMAL HIGH (ref 70–99)

## 2014-11-29 MED ORDER — SODIUM CHLORIDE 0.9 % IV SOLN: 500.0000 mL | INTRAVENOUS | Status: DC

## 2014-11-29 NOTE — Patient Instructions (Signed)
YOU HAD AN ENDOSCOPIC PROCEDURE TODAY AT [facility]:   Refer to the procedure report that was given to you for any specific questions about what was found during the examination.  If the procedure report does not answer your questions, please call your gastroenterologist to clarify.  If you requested that your care partner not be given the details of your procedure findings, then the procedure report has been included in a sealed envelope for you to review at your convenience later.  YOU SHOULD EXPECT: Some feelings of bloating in the abdomen. Passage of more gas than usual.  Walking can help get rid of the air that was put into your GI tract during the procedure and reduce the bloating. If you had a lower endoscopy (such as a colonoscopy or flexible sigmoidoscopy) you may notice spotting of blood in your stool or on the toilet paper. If you underwent a bowel prep for your procedure, you may not have a normal bowel movement for a few days.  Please Note:  You might notice some irritation and congestion in your nose or some drainage.  This is from the oxygen used during your procedure.  There is no need for concern and it should clear up in a day or so.  SYMPTOMS TO REPORT IMMEDIATELY:     Following upper endoscopy (EGD)  Vomiting of blood or coffee ground material  New chest pain or pain under the shoulder blades  Painful or persistently difficult swallowing  New shortness of breath  Fever of 100F or higher  Black, tarry-looking stools  For urgent or emergent issues, a gastroenterologist can be reached at any hour by calling [phone].   DIET: Your first meal following the procedure should be a small meal and then it is ok to progress to your normal diet. Heavy or fried foods are harder to digest and may make you feel nauseous or bloated.  Likewise, meals heavy in dairy and vegetables can increase bloating.  Drink plenty of fluids but you should avoid alcoholic beverages  for 24 hours.  ACTIVITY:  You should plan to take it easy for the rest of today and you should NOT DRIVE or use heavy machinery until tomorrow (because of the sedation medicines used during the test).    FOLLOW UP: Our staff will call the number listed on your records the next business day following your procedure to check on you and address any questions or concerns that you may have regarding the information given to you following your procedure. If we do not reach you, we will leave a message.  However, if you are feeling well and you are not experiencing any problems, there is no need to return our call.  We will assume that you have returned to your regular daily activities without incident.  If any biopsies were taken you will be contacted by phone or by letter within the next 1-3 weeks.  Please call us at [phone] if you have not heard about the biopsies in 3 weeks.    SIGNATURES/CONFIDENTIALITY: You and/or your care partner have signed paperwork which will be entered into your electronic medical record.  These signatures attest to the fact that that the information above on your After Visit Summary has been reviewed and is understood.  Full responsibility of the confidentiality of this discharge information lies with you and/or your care-partner.   Information on Barretts esophagus & esophagitis and hiatal hernia given to you today  Dr Kelby Fam office  will arrange a Gastric Emptying Study and notify you of the details  Call and schedule an appointment with Dr Deatra Ina for 4 weeks

## 2014-11-29 NOTE — Progress Notes (Signed)
Called to room to assist during endoscopic procedure.  Patient ID and intended procedure confirmed with present staff. Received instructions for my participation in the procedure from the performing physician.  

## 2014-11-29 NOTE — Progress Notes (Signed)
Report to PACU, RN, vss, BBS= Clear.  

## 2014-11-29 NOTE — Op Note (Signed)
[place]  [employer]. [place], [address]   ENDOSCOPY PROCEDURE REPORT  PATIENT: [name], [name]  MR#: [id number] BIRTHDATE: [birth year] , 13  yrs. old GENDER: male ENDOSCOPIST: Inda Castle, MD REFERRED BY: PROCEDURE DATE:  11/29/2014 PROCEDURE:  EGD w/ biopsy ASA CLASS:     Class II INDICATIONS:  nausea. MEDICATIONS: Monitored anesthesia care and Propofol 200 mg IV TOPICAL ANESTHETIC:  DESCRIPTION OF PROCEDURE: After the risks benefits and alternatives of the procedure were thoroughly explained, informed consent was obtained.  The LB [id number] endoscope was introduced through the mouth and advanced to the second portion of the duodenum , Without limitations.  The instrument was slowly withdrawn as the mucosa was fully examined.    ESOPHAGUS: There was LA Class B esophagitis (One or more mucosal breaks > 39mm, but without continuity across mucosal folds) noted. There was a 2cm segment of suspected Barrett's esophagus found in the distal esophagus.  There are erosive changes at the GE junction.  There are islands of gastric appearing epithelium extending at least 2 cm proximally.  Multiple biopsies were taken. Except for the findings listed, the EGD was otherwise normal. STOMACH: A 3 cm hiatal hernia was noted.  Retroflexed views revealed no abnormalities.     The scope was then withdrawn from the patient and the procedure completed.  COMPLICATIONS: There were no immediate complications.  ENDOSCOPIC IMPRESSION: 1.   There was LA Class B esophagitis noted 2.   There was a 2cm segment of suspected Barrett's esophagus found in the distal esophagus 3.   3 cm hiatal hernia  RECOMMENDATIONS: 1.  Await biopsy results 2.  Continue current meds 3.  My office will arrange for you to have a Gastric Emptying Scan performed.  This is a radiology test that gives an idea of how well your stomach functions. 4.  Call office next 2-3 days to schedule an  office appointment for 4 weeks  REPEAT EXAM:  eSigned:  Inda Castle, MD 11/29/2014 9:54 AM    CC: Lorayne Marek, MD  PATIENT NAME:  [name] [name], [name] [name] MR#: [id number]

## 2014-11-30 ENCOUNTER — Telehealth: Payer: Self-pay | Admitting: *Deleted

## 2014-11-30 NOTE — Telephone Encounter (Signed)
  Follow up Call-  Call back number 11/29/2014 02/13/2014  Post procedure Call Back phone  # [phone]  Permission to leave phone message Yes Yes     Patient questions:  Do you have a fever, pain , or abdominal swelling? No. Pain Score  0 *  Have you tolerated food without any problems? Yes.    Have you been able to return to your normal activities? Yes.    Do you have any questions about your discharge instructions: Diet   No. Medications  No. Follow up visit  No.  Do you have questions or concerns about your Care? No.  Actions: * If pain score is 4 or above: No action needed, pain <4.

## 2014-12-03 ENCOUNTER — Telehealth: Payer: Self-pay

## 2014-12-03 ENCOUNTER — Other Ambulatory Visit: Payer: Self-pay

## 2014-12-03 DIAGNOSIS — R110 Nausea: Secondary | ICD-10-CM

## 2014-12-03 NOTE — Telephone Encounter (Signed)
Per procedure note, the patient is scheduled for GES 12/17/14 at 7:00 arrive at 6:45 to the [facility] Radiology Dept. NPO for 8 hours. No stomach meds for 6 hours. Follow up appointment with Dr Deatra Ina 01/24/15 at 3:45

## 2014-12-04 NOTE — Telephone Encounter (Signed)
Patient advised. He also has his instructions and notification through "[facility]".

## 2014-12-10 ENCOUNTER — Other Ambulatory Visit: Payer: Self-pay

## 2014-12-10 MED ORDER — TADALAFIL 20 MG PO TABS: 10.0000 mg | ORAL_TABLET | Freq: Every day | ORAL | Status: DC | PRN

## 2014-12-10 MED ORDER — ALBUTEROL SULFATE HFA 108 (90 BASE) MCG/ACT IN AERS: 2.0000 | INHALATION_SPRAY | Freq: Four times a day (QID) | RESPIRATORY_TRACT | Status: DC | PRN

## 2014-12-12 ENCOUNTER — Encounter: Payer: Self-pay | Admitting: Gastroenterology

## 2014-12-17 ENCOUNTER — Ambulatory Visit (HOSPITAL_COMMUNITY)
Admission: RE | Admit: 2014-12-17 | Discharge: 2014-12-17 | Disposition: A | Discharge status: Home / Self Care | Payer: Self-pay | Source: Ambulatory Visit | Attending: Gastroenterology | Admitting: Gastroenterology

## 2014-12-17 DIAGNOSIS — B182 Chronic viral hepatitis C: Secondary | ICD-10-CM | POA: Insufficient documentation

## 2014-12-17 DIAGNOSIS — R112 Nausea with vomiting, unspecified: Secondary | ICD-10-CM | POA: Insufficient documentation

## 2014-12-17 DIAGNOSIS — R110 Nausea: Secondary | ICD-10-CM

## 2014-12-17 DIAGNOSIS — R52 Pain, unspecified: Secondary | ICD-10-CM | POA: Insufficient documentation

## 2014-12-17 MED ORDER — TECHNETIUM TC 99M SULFUR COLLOID
2.0000 | Freq: Once | INTRAVENOUS | Status: AC | PRN
  Administered 2014-12-17: 2 via ORAL

## 2014-12-17 NOTE — Progress Notes (Signed)
Quick Note:  Please inform the patient that your gastric emptying scan was normal and to continue current plan of action ______

## 2014-12-18 ENCOUNTER — Ambulatory Visit (HOSPITAL_COMMUNITY)
Admission: RE | Admit: 2014-12-18 | Discharge: 2014-12-18 | Disposition: A | Discharge status: Home / Self Care | Payer: Self-pay | Source: Ambulatory Visit | Attending: Internal Medicine | Admitting: Internal Medicine

## 2014-12-31 ENCOUNTER — Other Ambulatory Visit: Payer: Self-pay | Admitting: Internal Medicine

## 2015-01-01 ENCOUNTER — Other Ambulatory Visit: Payer: Self-pay | Admitting: Family Medicine

## 2015-01-01 DIAGNOSIS — E118 Type 2 diabetes mellitus with unspecified complications: Secondary | ICD-10-CM

## 2015-01-01 MED ORDER — GLIPIZIDE 5 MG PO TABS: 5.0000 mg | ORAL_TABLET | Freq: Two times a day (BID) | ORAL | Status: DC

## 2015-01-01 NOTE — Telephone Encounter (Signed)
Patient is due for 3 month f/u with PCP around 01/07/15

## 2015-01-01 NOTE — Telephone Encounter (Signed)
Patient is due for 3 month f/u with PCP (Due 01/07/15)

## 2015-01-09 ENCOUNTER — Other Ambulatory Visit: Payer: Self-pay | Admitting: Internal Medicine

## 2015-01-10 ENCOUNTER — Encounter: Payer: Self-pay | Admitting: Internal Medicine

## 2015-01-10 ENCOUNTER — Ambulatory Visit (INDEPENDENT_AMBULATORY_CARE_PROVIDER_SITE_OTHER): Payer: Self-pay | Admitting: Internal Medicine

## 2015-01-10 VITALS — BP 177/97 | HR 80 | Temp 98.7°F | Ht 71.0 in | Wt 189.0 lb

## 2015-01-10 DIAGNOSIS — B182 Chronic viral hepatitis C: Secondary | ICD-10-CM

## 2015-01-10 DIAGNOSIS — K7460 Unspecified cirrhosis of liver: Secondary | ICD-10-CM | POA: Insufficient documentation

## 2015-01-10 NOTE — Assessment & Plan Note (Addendum)
Concerning for cirrhosis on elastography.  No lab abnormalities or other signs of cirrhosis.  Will need continued [place] screening.  Had pneumovax.   Discussed liver health, I have no knowledge of evidence on benefits of beet root.   25 minutes spent inclduing 15 minutes of face to face counseling of elastography and liver health, cancer risk.

## 2015-01-10 NOTE — Assessment & Plan Note (Signed)
Hopefully to get Harvoni soon through [place] [facility].  Will follow up with labs 3-4 weeks after starting and see me after that.

## 2015-01-10 NOTE — Progress Notes (Signed)
   Subjective:    Patient ID: [name] [name], male    DOB: Apr 16, 1961, 54 y.o.   MRN: [id number]  HPI He comes in for follow-up of his hepatitis C.  He is genotype 1A with an initial viral load of 4.3 million. He is hepatitis A and B nonimmune and is undergoing vaccination.  He recently had elastography which is consistent with F3/F4. Also some possible steatosis. He does drink alcohol occasionally. Asked about beet root.     Review of Systems     Objective:   Physical Exam        Assessment & Plan:

## 2015-01-24 ENCOUNTER — Ambulatory Visit (INDEPENDENT_AMBULATORY_CARE_PROVIDER_SITE_OTHER): Payer: Self-pay | Admitting: Gastroenterology

## 2015-01-24 ENCOUNTER — Encounter: Payer: Self-pay | Admitting: Gastroenterology

## 2015-01-24 VITALS — BP 152/70 | HR 84 | Ht 70.25 in | Wt 184.4 lb

## 2015-01-24 DIAGNOSIS — K7469 Other cirrhosis of liver: Secondary | ICD-10-CM

## 2015-01-24 DIAGNOSIS — R110 Nausea: Secondary | ICD-10-CM

## 2015-01-24 DIAGNOSIS — B182 Chronic viral hepatitis C: Secondary | ICD-10-CM

## 2015-01-24 NOTE — Assessment & Plan Note (Signed)
To begin therapy with a Harvoni

## 2015-01-24 NOTE — Progress Notes (Signed)
      History of Present Illness:  [name]  continues to complain of intermittent nausea with vomiting.  Gastric emptying scan was normal.  Endoscopy was pertinent for Barrett's esophagus.  He was prescribed but did not purchase Zegerid.  He's had problems with nausea and vomiting for well over a year.  Symptoms preceded any new medications that were prescribed including Celexa and Neurontin.  He rarely takes ibuprofen.  He has been on Glucotrol but had problems with other oral hypoglycemic agents.  He has not started Harvoni yet Review of Systems: Pertinent positive and negative review of systems were noted in the above HPI section. All other review of systems were otherwise negative.    Current Medications, Allergies, Past Medical History, Past Surgical History, Family History and Social History were reviewed in [facility] record  Vital signs were reviewed in today's medical record. Physical Exam: General: Well developed , well nourished, no acute distress Skin: anicteric Head: Normocephalic and atraumatic Eyes:  sclerae anicteric, EOMI Ears: Normal auditory acuity Mouth: No deformity or lesions Lymph Nodes: no lymphadenopathy Lungs: Clear throughout to auscultation Heart: Regular rate and rhythm; no murmurs, rubs or brui: Gastroinestinal:  Soft, non tender and non distended. No masses, hepatosplenomegaly or hernias noted. Normal Bowel sounds.  There is no succussion splash Rectal:deferred Musculoskeletal: Symmetrical with no gross deformities  Pulses:  Normal pulses noted Extremities: No clubbing, cyanosis, edema or deformities noted Neurological: Alert oriented x 4, grossly nonfocal Psychological:  Alert and cooperative. Normal mood and affect  See Assessment and Plan under Problem List

## 2015-01-24 NOTE — Assessment & Plan Note (Signed)
At this point I suspect that nausea may be a medication-effect.  He has had difficulties with oral hypoglycemics in the past where he developed abdominal pain.  I'm concerned that Glucotrol may be an offending agent area recon  Recommendations #1 I will last Dr. Annitta Needs to consider switching oral hypoglycemics or even trying insulin while holding Glucotrol

## 2015-01-24 NOTE — Patient Instructions (Signed)
Follow up as needed

## 2015-01-24 NOTE — Assessment & Plan Note (Signed)
She has stable hepatic function.  Hopefully cirrhosis will not progress if the eradicate the hepatitis C virus.

## 2015-01-25 ENCOUNTER — Encounter: Payer: Self-pay | Admitting: Internal Medicine

## 2015-01-25 ENCOUNTER — Ambulatory Visit: Payer: Self-pay | Attending: Internal Medicine | Admitting: Internal Medicine

## 2015-01-25 VITALS — BP 120/65 | HR 87 | Temp 99.1°F | Resp 16 | Ht 72.0 in | Wt 185.0 lb

## 2015-01-25 DIAGNOSIS — B182 Chronic viral hepatitis C: Secondary | ICD-10-CM | POA: Insufficient documentation

## 2015-01-25 DIAGNOSIS — Z720 Tobacco use: Secondary | ICD-10-CM

## 2015-01-25 DIAGNOSIS — R110 Nausea: Secondary | ICD-10-CM | POA: Insufficient documentation

## 2015-01-25 DIAGNOSIS — E119 Type 2 diabetes mellitus without complications: Secondary | ICD-10-CM | POA: Insufficient documentation

## 2015-01-25 DIAGNOSIS — F17200 Nicotine dependence, unspecified, uncomplicated: Secondary | ICD-10-CM

## 2015-01-25 DIAGNOSIS — F17210 Nicotine dependence, cigarettes, uncomplicated: Secondary | ICD-10-CM | POA: Insufficient documentation

## 2015-01-25 DIAGNOSIS — N529 Male erectile dysfunction, unspecified: Secondary | ICD-10-CM | POA: Insufficient documentation

## 2015-01-25 DIAGNOSIS — E118 Type 2 diabetes mellitus with unspecified complications: Secondary | ICD-10-CM

## 2015-01-25 LAB — GLUCOSE, POCT (MANUAL RESULT ENTRY): POC Glucose: 247 mg/dl — AB (ref 70–99)

## 2015-01-25 LAB — POCT GLYCOSYLATED HEMOGLOBIN (HGB A1C): HEMOGLOBIN A1C: 6.1

## 2015-01-25 LAB — MICROALBUMIN, URINE: Microalb, Ur: <0.2 mg/dL (ref <2.0)

## 2015-01-25 NOTE — Progress Notes (Signed)
MRN: [id number] Name: [name] [name]  Sex: male Age: 54 y.o. DOB: Dec 18, 1960  Allergies: Review of patient's allergies indicates no known allergies.  Chief Complaint  Patient presents with  . Follow-up  . Diabetes  . Erectile Dysfunction    HPI: Patient is 54 y.o. male who has to of diabetes, hep C, patient is being followed up by infectious disease and is in the process to be started on how 1 he, subsequently followed up with GI in the symptoms of nausea vomiting, he was advised to switch to oral hypoglycemic medication probably that contributes to her symptoms, patient reports he has modified his diet, his hemoglobin A1c is 6.1%, discussed with the patient regarding switching to the different medication another option is continue with diet modification old on any hypo-glycemic agents and recheck A1c in 3 months, patient agrees to that plan, currently denies any nausea vomiting headache dizziness chest and shortness of breath.  Past Medical History  Diagnosis Date  . Depression   . Diabetes   . Hepatitis C   . Bronchitis   . Neuropathy   . ED (erectile dysfunction)   . GERD (gastroesophageal reflux disease)     Past Surgical History  Procedure Laterality Date  . Appendectomy    . Tonsillectomy    . Colonoscopy N/A 02/16/2014    Procedure: COLONOSCOPY;  Surgeon: Gatha Mayer, MD;  Location: [facility] END[facility]COPY;  Service: Endoscopy;  Laterality: N/A;  . Colonoscopy        Medication List       This list is accurate as of: 01/25/15 12:46 PM.  Always use your most recent med list.               albuterol 108 (90 BASE) MCG/ACT inhaler  Commonly known as:  PROVENTIL HFA;VENTOLIN HFA  Inhale 2 puffs into the lungs every 6 (six) hours as needed for wheezing or shortness of breath.     citalopram 20 MG tablet  Commonly known as:  CELEXA  Take 20 mg by mouth daily.     FISH OIL PO  Take 1 tablet by mouth daily.     freestyle lancets  Use as instructed     gabapentin  300 MG capsule  Commonly known as:  NEURONTIN  Take 1 capsule (300 mg total) by mouth 3 (three) times daily.     glucose blood test strip  Use as instructed     glucose monitoring kit monitoring kit  1 each by Does not apply route as needed for other. Used as directed     ibuprofen 200 MG tablet  Commonly known as:  ADVIL,MOTRIN  Take 200 mg by mouth every 6 (six) hours as needed.     ipratropium 0.06 % nasal spray  Commonly known as:  ATROVENT  Place 2 sprays into both nostrils 4 (four) times daily.     Ledipasvir-Sofosbuvir 90-400 MG Tabs  Commonly known as:  HARVONI  Take 1 tablet by mouth daily.     metoCLOPramide 10 MG tablet  Commonly known as:  REGLAN  TAKE 1 TABLET BY MOUTH EVERY 8 HOURS AS NEEDED FOR NAUSEA. TAKE 1 TAB BEFORE BEDTIME AND 1 IN THE MORNING FOR STOMACH     omeprazole-sodium bicarbonate 40-1100 MG per capsule  Commonly known as:  ZEGERID  Take 1 capsule by mouth at bedtime.     tadalafil 20 MG tablet  Commonly known as:  CIALIS  Take 0.5-1 tablets (10-20 mg total) by mouth daily  as needed for erectile dysfunction.     zolpidem 10 MG tablet  Commonly known as:  AMBIEN  Take 10 mg by mouth at bedtime as needed for sleep. At 2000        No orders of the defined types were placed in this encounter.    Immunization History  Administered Date(s) Administered  . Hepatitis A, Adult 10/31/2014  . Hepatitis B, adult/adol-2 dose 10/31/2014, 11/28/2014  . Pneumococcal Polysaccharide-23 02/16/2014    Family History  Problem Relation Age of Onset  . Cancer Mother   . Diabetes Mother   . Colon cancer Neg Hx   . Heart disease Maternal Grandfather   . Pancreatic cancer Paternal Grandmother     History  Substance Use Topics  . Smoking status: Current Every Day Smoker -- 0.50 packs/day for 35 years  . Smokeless tobacco: Never Used  . Alcohol Use: 12.6 oz/week    21 Standard drinks or equivalent per week     Comment: 21 beers a week    Review of  Systems   As noted in HPI  Filed Vitals:   01/25/15 0953  BP: 120/65  Pulse: 87  Temp: 99.1 F (37.3 C)  Resp: 16    Physical Exam  Physical Exam  Constitutional: No distress.  Eyes: EOM are normal. Pupils are equal, round, and reactive to light.  Cardiovascular: Normal rate and regular rhythm.   Pulmonary/Chest: Breath sounds normal. No respiratory distress. He has no wheezes. He has no rales.  Musculoskeletal:  Feet no ulcers or callus    CBC    Component Value Date/Time   WBC 7.6 10/16/2014 1131   RBC 4.93 10/16/2014 1131   HGB 15.4 10/16/2014 1131   HCT 46.8 10/16/2014 1131   PLT 229 10/16/2014 1131   MCV 94.9 10/16/2014 1131   LYMPHSABS 2.4 10/16/2014 1131   MONOABS 0.8 10/16/2014 1131   EOSABS 0.2 10/16/2014 1131   BASOSABS 0.1 10/16/2014 1131    CMP     Component Value Date/Time   NA 135 10/09/2014 1107   K 5.1 10/09/2014 1107   CL 105 10/09/2014 1107   CO2 27 10/09/2014 1107   GLUCOSE 136* 10/09/2014 1107   BUN 10 10/09/2014 1107   CREATININE 0.73 10/09/2014 1107   CREATININE 0.76 02/15/2014 1226   CALCIUM 9.9 10/09/2014 1107   PROT 7.6 10/09/2014 1107   ALBUMIN 4.5 10/09/2014 1107   AST 28 10/09/2014 1107   ALT 33 10/09/2014 1107   ALKPHOS 73 10/09/2014 1107   BILITOT 0.7 10/09/2014 1107   GFRNONAA >89 10/09/2014 1107   GFRNONAA >90 02/15/2014 1226   GFRAA >89 10/09/2014 1107   GFRAA >90 02/15/2014 1226    Lab Results  Component Value Date/Time   CHOL 184 12/21/2013 09:34 AM    Lab Results  Component Value Date/Time   HGBA1C 6.10 01/25/2015 09:53 AM   HGBA1C 6.1* 04/11/2014 12:18 PM    Lab Results  Component Value Date/Time   AST 28 10/09/2014 11:07 AM    Assessment and Plan  Type 2 diabetes mellitus with complication - Plan:  Results for orders placed or performed in visit on 01/25/15  POCT A1C  Result Value Ref Range   Hemoglobin A1C 6.10   POCT glucose (manual entry)  Result Value Ref Range   POC Glucose 247 (A) 70 -  99 mg/dl   At this point patient wants to modify his diet and will hold off Glucotrol since it is causing him GI symptoms,  will recheck A1c in 3 months if it is trending up consider restarting oral hypoglycemics  Microalbumin, urine  Tobacco use disorder Counseled patient for smoking cessation  Chronic hepatitis C without hepatic coma Patient is following up with ID and will start the treatment  Nausea without vomiting improved.   Return in about 3 months (around 04/27/2015), or if symptoms worsen or fail to improve.   This note has been created with Surveyor, quantity. Any transcriptional errors are unintentional.    Lorayne Marek, MD

## 2015-01-25 NOTE — Progress Notes (Signed)
F/U DM, ED Complaining of vomiting possible form DM medication

## 2015-03-19 ENCOUNTER — Other Ambulatory Visit: Payer: Self-pay

## 2015-03-19 ENCOUNTER — Other Ambulatory Visit: Payer: Self-pay | Admitting: Internal Medicine

## 2015-03-19 DIAGNOSIS — B182 Chronic viral hepatitis C: Secondary | ICD-10-CM

## 2015-03-19 LAB — CBC WITH DIFFERENTIAL/PLATELET
Basophils Absolute: 0.1 10*3/uL (ref 0.0–0.1)
Basophils Relative: 1 % (ref 0–1)
Eosinophils Absolute: 0.3 10*3/uL (ref 0.0–0.7)
Eosinophils Relative: 3 % (ref 0–5)
HEMATOCRIT: 46.2 % (ref 39.0–52.0)
Hemoglobin: 16 g/dL (ref 13.0–17.0)
Lymphocytes Relative: 33 % (ref 12–46)
Lymphs Abs: 3.8 10*3/uL (ref 0.7–4.0)
MCH: 32.4 pg (ref 26.0–34.0)
MCHC: 34.6 g/dL (ref 30.0–36.0)
MCV: 93.5 fL (ref 78.0–100.0)
MONO ABS: 1.4 10*3/uL — AB (ref 0.1–1.0)
MPV: 10 fL (ref 8.6–12.4)
Monocytes Relative: 12 % (ref 3–12)
NEUTROS ABS: 5.8 10*3/uL (ref 1.7–7.7)
Neutrophils Relative %: 51 % (ref 43–77)
Platelets: 233 10*3/uL (ref 150–400)
RBC: 4.94 MIL/uL (ref 4.22–5.81)
RDW: 12.8 % (ref 11.5–15.5)
WBC: 11.4 10*3/uL — ABNORMAL HIGH (ref 4.0–10.5)

## 2015-03-19 LAB — COMPREHENSIVE METABOLIC PANEL
ALK PHOS: 63 U/L (ref 39–117)
ALT: 40 U/L (ref 0–53)
AST: 27 U/L (ref 0–37)
Albumin: 4.3 g/dL (ref 3.5–5.2)
BUN: 12 mg/dL (ref 6–23)
CALCIUM: 10.2 mg/dL (ref 8.4–10.5)
CHLORIDE: 103 meq/L (ref 96–112)
CO2: 23 meq/L (ref 19–32)
Creat: 0.8 mg/dL (ref 0.50–1.35)
Glucose, Bld: 135 mg/dL — ABNORMAL HIGH (ref 70–99)
POTASSIUM: 4.6 meq/L (ref 3.5–5.3)
Sodium: 138 mEq/L (ref 135–145)
Total Bilirubin: 0.6 mg/dL (ref 0.2–1.2)
Total Protein: 7.9 g/dL (ref 6.0–8.3)

## 2015-03-20 ENCOUNTER — Other Ambulatory Visit: Payer: Self-pay

## 2015-03-21 LAB — HEPATITIS C RNA QUANTITATIVE

## 2015-04-02 ENCOUNTER — Ambulatory Visit (INDEPENDENT_AMBULATORY_CARE_PROVIDER_SITE_OTHER): Payer: Self-pay | Admitting: Internal Medicine

## 2015-04-02 ENCOUNTER — Encounter: Payer: Self-pay | Admitting: Internal Medicine

## 2015-04-02 VITALS — BP 152/89 | HR 73 | Temp 98.6°F | Ht 71.0 in | Wt 180.0 lb

## 2015-04-02 DIAGNOSIS — K7460 Unspecified cirrhosis of liver: Secondary | ICD-10-CM

## 2015-04-02 DIAGNOSIS — B182 Chronic viral hepatitis C: Secondary | ICD-10-CM

## 2015-04-02 DIAGNOSIS — R188 Other ascites: Secondary | ICD-10-CM

## 2015-04-02 NOTE — Assessment & Plan Note (Signed)
Had EGD.  Will need ultrasound after next visit for [facility] screen.

## 2015-04-02 NOTE — Assessment & Plan Note (Signed)
Lab reviewed, now undetectable, doing well.  Discussed need to continue for 12 weeks.  Follow up after treatment completion.

## 2015-04-02 NOTE — Progress Notes (Signed)
   Subjective:    Patient ID: [name] [name], male    DOB: [birth year], 54 y.o.   MRN: [id number]  HPI Here for folfow up of HCV. 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  Had EGD and esophagitis.  Was prescribed omeprazole but not taking for now due to [place].  Please with results. No alcohol.  Virus now undetectable.  About 1/2 through treatment of 12 weeks.    Review of Systems  Constitutional: Negative for fatigue.  Gastrointestinal: Negative for nausea, diarrhea and abdominal distention.  Skin: Negative for rash.  Neurological: Negative for dizziness and headaches.       Objective:   Physical Exam  Constitutional: He appears well-developed and well-nourished. No distress.  HENT:  Mouth/Throat: No oropharyngeal exudate.  Eyes: No scleral icterus.  Cardiovascular: Normal rate, regular rhythm and normal heart sounds.   No murmur heard. Pulmonary/Chest: Effort normal and breath sounds normal. No respiratory distress.  Lymphadenopathy:    He has no cervical adenopathy.          Assessment & Plan:

## 2015-05-17 ENCOUNTER — Emergency Department (INDEPENDENT_AMBULATORY_CARE_PROVIDER_SITE_OTHER)
Admission: EM | Admit: 2015-05-17 | Discharge: 2015-05-17 | Disposition: A | Discharge status: Home / Self Care | Payer: Self-pay | Source: Home / Self Care | Attending: Family Medicine | Admitting: Family Medicine

## 2015-05-17 ENCOUNTER — Encounter (HOSPITAL_COMMUNITY): Payer: Self-pay | Admitting: Emergency Medicine

## 2015-05-17 DIAGNOSIS — IMO0001 Reserved for inherently not codable concepts without codable children: Secondary | ICD-10-CM

## 2015-05-17 DIAGNOSIS — T63441A Toxic effect of venom of bees, accidental (unintentional), initial encounter: Secondary | ICD-10-CM

## 2015-05-17 MED ORDER — DEXAMETHASONE SODIUM PHOSPHATE 10 MG/ML IJ SOLN
10.0000 mg | Freq: Once | INTRAMUSCULAR | Status: AC
  Administered 2015-05-17: 10 mg via INTRAMUSCULAR

## 2015-05-17 MED ORDER — PREDNISONE 50 MG PO TABS: ORAL_TABLET | ORAL | Status: DC

## 2015-05-17 MED ORDER — DEXAMETHASONE SODIUM PHOSPHATE 10 MG/ML IJ SOLN
INTRAMUSCULAR | Status: AC
  Filled 2015-05-17: qty 1

## 2015-05-17 NOTE — ED Notes (Signed)
Janne Napoleon, NP notified of patient and complaint

## 2015-05-17 NOTE — Discharge Instructions (Signed)
Bee, Wasp, or Hornet Sting To limit drowsiness may replace the Benadryl with either Claritin or Allegra along with Zantac 150 mg. Prednisone 50 mg day one, day 2 then one half tablet on day 3 and 4. For worsening new symptoms or problems may return or go to the emergency department. Your caregiver has diagnosed you as having an insect sting. An insect sting appears as a red lump in the skin that sometimes has a tiny hole in the center, or it may have a stinger in the center of the wound. The most common stings are from wasps, hornets and bees. Individuals have different reactions to insect stings.  A normal reaction may cause pain, swelling, and redness around the sting site.  A localized allergic reaction may cause swelling and redness that extends beyond the sting site.  A large local reaction may continue to develop over the next 12 to 36 hours.  On occasion, the reactions can be severe (anaphylactic reaction). An anaphylactic reaction may cause wheezing; difficulty breathing; chest pain; fainting; raised, itchy, red patches on the skin; a sick feeling to your stomach (nausea); vomiting; cramping; or diarrhea. If you have had an anaphylactic reaction to an insect sting in the past, you are more likely to have one again. HOME CARE INSTRUCTIONS   With bee stings, a small sac of poison is left in the wound. Brushing across this with something such as a credit card, or anything similar, will help remove this and decrease the amount of the reaction. This same procedure will not help a wasp sting as they do not leave behind a stinger and poison sac.  Apply a cold compress for 10 to 20 minutes every hour for 1 to 2 days, depending on severity, to reduce swelling and itching.  To lessen pain, a paste made of water and baking soda may be rubbed on the bite or sting and left on for 5 minutes.  To relieve itching and swelling, you may use take medication or apply medicated creams or lotions as  directed.  Only take over-the-counter or prescription medicines for pain, discomfort, or fever as directed by your caregiver.  Wash the sting site daily with soap and water. Apply antibiotic ointment on the sting site as directed.  If you suffered a severe reaction:  If you did not require hospitalization, an adult will need to stay with you for 24 hours in case the symptoms return.  You may need to wear a medical bracelet or necklace stating the allergy.  You and your family need to learn when and how to use an anaphylaxis kit or epinephrine injection.  If you have had a severe reaction before, always carry your anaphylaxis kit with you. SEEK MEDICAL CARE IF:   None of the above helps within 2 to 3 days.  The area becomes red, warm, tender, and swollen beyond the area of the bite or sting.  You have an oral temperature above 102 F (38.9 C). SEEK IMMEDIATE MEDICAL CARE IF:  You have symptoms of an allergic reaction which are:  Wheezing.  Difficulty breathing.  Chest pain.  Lightheadedness or fainting.  Itchy, raised, red patches on the skin.  Nausea, vomiting, cramping or diarrhea. ANY OF THESE SYMPTOMS MAY REPRESENT A SERIOUS PROBLEM THAT IS AN EMERGENCY. Do not wait to see if the symptoms will go away. Get medical help right away. Call your local emergency services (911 in [place] DO NOT drive yourself to the hospital. MAKE SURE YOU:  Understand these instructions.  Will watch your condition.  Will get help right away if you are not doing well or get worse. Document Released: 08/17/2005 Document Revised: 11/09/2011 Document Reviewed: 02/01/2010 [facility] Patient Information 2015 [place], [place]. This information is not intended to replace advice given to you by your health care provider. Make sure you discuss any questions you have with your health care provider.  Anaphylactic Reaction An anaphylactic reaction is a sudden, severe allergic reaction that involves the  whole body. It can be life threatening. A hospital stay is often required. People with asthma, eczema, or hay fever are slightly more likely to have an anaphylactic reaction. CAUSES  An anaphylactic reaction may be caused by anything to which you are allergic. After being exposed to the allergic substance, your immune system becomes sensitized to it. When you are exposed to that allergic substance again, an allergic reaction can occur. Common causes of an anaphylactic reaction include:  Medicines.  Foods, especially peanuts, wheat, shellfish, milk, and eggs.  Insect bites or stings.  Blood products.  Chemicals, such as dyes, latex, and contrast material used for imaging tests. SYMPTOMS  When an allergic reaction occurs, the body releases histamine and other substances. These substances cause symptoms such as tightening of the airway. Symptoms often develop within seconds or minutes of exposure. Symptoms may include:  Skin rash or hives.  Itching.  Chest tightness.  Swelling of the eyes, tongue, or lips.  Trouble breathing or swallowing.  Lightheadedness or fainting.  Anxiety or confusion.  Stomach pains, vomiting, or diarrhea.  Nasal congestion.  A fast or irregular heartbeat (palpitations). DIAGNOSIS  Diagnosis is based on your history of recent exposure to allergic substances, your symptoms, and a physical exam. Your caregiver may also perform blood or urine tests to confirm the diagnosis. TREATMENT  Epinephrine medicine is the main treatment for an anaphylactic reaction. Other medicines that may be used for treatment include antihistamines, steroids, and albuterol. In severe cases, fluids and medicine to support blood pressure may be given through an intravenous line (IV). Even if you improve after treatment, you need to be observed to make sure your condition does not get worse. This may require a stay in the hospital. [place] a medical alert  bracelet or necklace stating your allergy.  You and your family must learn how to use an anaphylaxis kit or give an epinephrine injection to temporarily treat an emergency allergic reaction. Always carry your epinephrine injection or anaphylaxis kit with you. This can be lifesaving if you have a severe reaction.  Do not drive or perform tasks after treatment until the medicines used to treat your reaction have worn off, or until your caregiver says it is okay.  If you have hives or a rash:  Take medicines as directed by your caregiver.  You may use an over-the-counter antihistamine (diphenhydramine) as needed.  Apply cold compresses to the skin or take baths in cool water. Avoid hot baths or showers. SEEK MEDICAL CARE IF:   You develop symptoms of an allergic reaction to a new substance. Symptoms may start right away or minutes later.  You develop a rash, hives, or itching.  You develop new symptoms. SEEK IMMEDIATE MEDICAL CARE IF:   You have swelling of the mouth, difficulty breathing, or wheezing.  You have a tight feeling in your chest or throat.  You develop hives, swelling, or itching all over your body.  You develop severe vomiting or diarrhea.  You  feel faint or pass out. This is an emergency. Use your epinephrine injection or anaphylaxis kit as you have been instructed. Call your local emergency services (911 in [place] Even if you improve after the injection, you need to be examined at a hospital emergency department. MAKE SURE YOU:   Understand these instructions.  Will watch your condition.  Will get help right away if you are not doing well or get worse. Document Released: 08/17/2005 Document Revised: 08/22/2013 Document Reviewed: 11/18/2011 [facility] Patient Information 2015 [place], [place]. This information is not intended to replace advice given to you by your health care provider. Make sure you discuss any questions you have with your health care provider.

## 2015-05-17 NOTE — ED Provider Notes (Signed)
She CSN: [id number]     Arrival date & time 05/17/15  1455 History   First MD Initiated Contact with Patient 05/17/15 1506     Chief Complaint  Patient presents with  . Insect Bite  . Allergic Reaction   (Consider location/radiation/quality/duration/timing/severity/associated sxs/prior Treatment) HPI Comments: 54 year old male was stung by bee to the left long finger approximately one hour prior to arrival. He has developed swelling and itching to that finger as well as to the distal hand, dorsal aspect. His only symptom is the recent development of chest pain. He states that the last time that he had a bee sting he had developed chest pain. The workup for this was negative for cardiac or pulmonary etiology. He described it as a referred type pain from muscle contraction. He denies having any known heart disease. He is a smoker. He denies having intraoral edema in the past. Denies having angioedema, hypertension or respiratory difficulties. These too are not present today.   Past Medical History  Diagnosis Date  . Depression   . Diabetes   . Hepatitis C   . Bronchitis   . Neuropathy   . ED (erectile dysfunction)   . GERD (gastroesophageal reflux disease)    Past Surgical History  Procedure Laterality Date  . Appendectomy    . Tonsillectomy    . Colonoscopy N/A 02/16/2014    Procedure: COLONOSCOPY;  Surgeon: Gatha Mayer, MD;  Location: [facility];  Service: Endoscopy;  Laterality: N/A;  . Colonoscopy     Family History  Problem Relation Age of Onset  . Cancer Mother   . Diabetes Mother   . Colon cancer Neg Hx   . Heart disease Maternal Grandfather   . Pancreatic cancer Paternal Grandmother    Social History  Substance Use Topics  . Smoking status: Former Smoker -- 0.50 packs/day for 35 years    Quit date: 03/26/2015  . Smokeless tobacco: Never Used  . Alcohol Use: 12.6 oz/week    21 Standard drinks or equivalent per week     Comment: 21 beers a week    Review of  Systems  Constitutional: Negative for fever, activity change and fatigue.  HENT: Negative for congestion, ear pain, facial swelling, mouth sores, postnasal drip, rhinorrhea, sinus pressure, sneezing, sore throat, trouble swallowing and voice change.   Eyes: Negative.   Respiratory: Negative for cough and shortness of breath.   Cardiovascular: Positive for chest pain. Negative for palpitations and leg swelling.  Gastrointestinal: Negative.   Genitourinary: Negative.   Musculoskeletal: Negative for neck pain and neck stiffness.  Skin: Negative for color change and rash.  Neurological: Negative.   Psychiatric/Behavioral: Negative.     Allergies  Review of patient's allergies indicates no known allergies.  Home Medications   Prior to Admission medications   Medication Sig Start Date End Date Taking? Authorizing Provider  DiphenhydrAMINE HCl (BENADRYL ALLERGY PO) Take by mouth.   Yes Historical Provider, MD  albuterol (PROVENTIL HFA;VENTOLIN HFA) 108 (90 BASE) MCG/ACT inhaler Inhale 2 puffs into the lungs every 6 (six) hours as needed for wheezing or shortness of breath. 12/10/14   Lorayne Marek, MD  citalopram (CELEXA) 20 MG tablet Take 20 mg by mouth daily.    Historical Provider, MD  gabapentin (NEURONTIN) 300 MG capsule Take 1 capsule (300 mg total) by mouth 3 (three) times daily. Patient taking differently: Take 300 mg by mouth daily.  10/09/14   Lorayne Marek, MD  glucose blood test strip Use as instructed 10/24/14  Lorayne Marek, MD  glucose monitoring kit (FREESTYLE) monitoring kit 1 each by Does not apply route as needed for other. Used as directed 10/24/14   Lorayne Marek, MD  ibuprofen (ADVIL,MOTRIN) 200 MG tablet Take 200 mg by mouth every 6 (six) hours as needed.    Historical Provider, MD  Lancets (FREESTYLE) lancets Use as instructed 10/24/14   Lorayne Marek, MD  Ledipasvir-Sofosbuvir (HARVONI) 90-400 MG TABS Take 1 tablet by mouth daily. 10/31/14   Thayer Headings, MD   metoCLOPramide (REGLAN) 10 MG tablet TAKE 1 TABLET BY MOUTH EVERY 8 HOURS AS NEEDED FOR NAUSEA. TAKE 1 TAB BEFORE BEDTIME AND 1 IN THE MORNING FOR STOMACH 01/18/15   Lorayne Marek, MD  Omega-3 Fatty Acids (FISH OIL PO) Take 1 tablet by mouth daily.    Historical Provider, MD  predniSONE (DELTASONE) 50 MG tablet 1 tab po on day 1, one tab po on day 2, 1/2 tab po on days 3 and 4 05/17/15   Janne Napoleon, NP  tadalafil (CIALIS) 20 MG tablet Take 0.5-1 tablets (10-20 mg total) by mouth daily as needed for erectile dysfunction. 12/10/14   Lorayne Marek, MD  zolpidem (AMBIEN) 10 MG tablet Take 10 mg by mouth at bedtime as needed for sleep. [facility] [place] Provider, MD   Meds Ordered and Administered this Visit   Medications  dexamethasone (DECADRON) injection 10 mg (10 mg Intramuscular Given 05/17/15 1532)    There were no vitals taken for this visit. No data found.   Physical Exam  Constitutional: He is oriented to person, place, and time. He appears well-developed and well-nourished. No distress.  Patient is stable arrival. He is having some swelling at the site of the bee sting of the left finger and hand. He is complaining of recent onset of mild anterior chest tightening. He is breathing well and otherwise asymptomatic.  HENT:  Head: Normocephalic and atraumatic.  Mouth/Throat: Oropharynx is clear and moist. No oropharyngeal exudate.  No intraoral erythema or edema. No swelling of the uvula. Airway is widely patent. Swallowing reflex intact. No thickening or pain of the neck.  Eyes: Conjunctivae and EOM are normal.  Neck: Normal range of motion. Neck supple.  Cardiovascular: Normal rate, regular rhythm and normal heart sounds.   Pulmonary/Chest: Effort normal. No respiratory distress. He has no wheezes.  Breath sounds diminished bilaterally. Patient is a long-time smoker. No wheezing.  Musculoskeletal: Normal range of motion. He exhibits no edema or tenderness.  Lymphadenopathy:    He  has no cervical adenopathy.  Neurological: He is alert and oriented to person, place, and time. No cranial nerve deficit.  Skin: Skin is warm and dry. No rash noted. He is not diaphoretic.  Psychiatric: He has a normal mood and affect.  Nursing note and vitals reviewed.   ED Course  Procedures (including critical care time)  Labs Review Labs Reviewed - No data to display  Imaging Review No results found.   Visual Acuity Review  Right Eye Distance:   Left Eye Distance:   Bilateral Distance:    Right Eye Near:   Left Eye Near:    Bilateral Near:         MDM   1. Hymenoptera reaction, accidental or unintentional, initial encounter    Patient is stable on discharge. No additional symptoms. States he feels generally well and the swelling in the affected hand is reducing. o limit drowsiness may replace the Benadryl with either Claritin or Allegra along with  Zantac 150 mg. Prednisone 50 mg day one, day 2 then one half tablet on day 3 and 4. For worsening new symptoms or problems may return or go to the emergency department.     Janne Napoleon, NP 05/17/15 [phone]

## 2015-06-03 ENCOUNTER — Ambulatory Visit (INDEPENDENT_AMBULATORY_CARE_PROVIDER_SITE_OTHER): Payer: Self-pay | Admitting: *Deleted

## 2015-06-03 ENCOUNTER — Other Ambulatory Visit: Payer: Self-pay

## 2015-06-03 DIAGNOSIS — B182 Chronic viral hepatitis C: Secondary | ICD-10-CM

## 2015-06-03 DIAGNOSIS — B1920 Unspecified viral hepatitis C without hepatic coma: Secondary | ICD-10-CM

## 2015-06-03 DIAGNOSIS — Z23 Encounter for immunization: Secondary | ICD-10-CM

## 2015-06-04 LAB — HEPATITIS C RNA QUANTITATIVE: HCV Quantitative: NOT DETECTED IU/mL (ref <15)

## 2015-06-13 ENCOUNTER — Encounter: Payer: Self-pay | Admitting: Internal Medicine

## 2015-06-13 ENCOUNTER — Telehealth: Payer: Self-pay | Admitting: *Deleted

## 2015-06-13 ENCOUNTER — Ambulatory Visit (INDEPENDENT_AMBULATORY_CARE_PROVIDER_SITE_OTHER): Payer: Self-pay | Admitting: Internal Medicine

## 2015-06-13 VITALS — BP 143/83 | HR 81 | Temp 98.3°F | Ht 71.0 in | Wt 178.0 lb

## 2015-06-13 DIAGNOSIS — K7460 Unspecified cirrhosis of liver: Secondary | ICD-10-CM

## 2015-06-13 DIAGNOSIS — B182 Chronic viral hepatitis C: Secondary | ICD-10-CM

## 2015-06-13 NOTE — Telephone Encounter (Signed)
Patient scheduled for ultrasound at [facility] Monday 10/24, 9:00 (8:45 arrival) NPO for 8 hours.  6 month ultrasound scheduled at [facility] 4/13 9:00 (8:45 arrival) NPO for 6 hours. Patient verbalized understanding, acceptance. Asked for a MyChart message with the appointment information.  Done. Landis Gandy, RN

## 2015-06-13 NOTE — Assessment & Plan Note (Signed)
Encouraged not to drink at all.  Discussed risks. [place] screen now and every 6 months.

## 2015-06-13 NOTE — Progress Notes (Signed)
   Subjective:    Patient ID: [name] [name], male    DOB: April 16, 1961, 54 y.o.   MRN: [id number]  HPI Here for folfow up of HCV. 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  Had EGD and esophagitis.  Was prescribed omeprazole.  Now completed Harvoni and end of treatment viral load is negative.  Please with results. Does still occasionally drink.  Finished Hepatitis A and B series.     Review of Systems  Constitutional: Negative for fatigue.  Gastrointestinal: Negative for nausea, diarrhea and abdominal distention.  Skin: Negative for rash.  Neurological: Negative for dizziness and headaches.       Objective:   Physical Exam  Constitutional: He appears well-developed and well-nourished. No distress.  HENT:  Mouth/Throat: No oropharyngeal exudate.  Eyes: No scleral icterus.  Cardiovascular: Normal rate, regular rhythm and normal heart sounds.   No murmur heard. Pulmonary/Chest: Effort normal and breath sounds normal. No respiratory distress.  Lymphadenopathy:    He has no cervical adenopathy.          Assessment & Plan:

## 2015-06-13 NOTE — Assessment & Plan Note (Signed)
Remains undetectable.  RTC 6 months for SVR.

## 2015-06-24 ENCOUNTER — Ambulatory Visit (HOSPITAL_COMMUNITY): Payer: Self-pay

## 2015-06-25 ENCOUNTER — Ambulatory Visit: Payer: Self-pay | Attending: Internal Medicine

## 2015-07-03 ENCOUNTER — Other Ambulatory Visit: Payer: Self-pay | Admitting: Licensed Clinical Social Worker

## 2015-07-31 ENCOUNTER — Other Ambulatory Visit: Payer: Self-pay | Admitting: Licensed Clinical Social Worker

## 2015-08-01 ENCOUNTER — Ambulatory Visit (INDEPENDENT_AMBULATORY_CARE_PROVIDER_SITE_OTHER): Payer: Self-pay | Admitting: Licensed Clinical Social Worker

## 2015-08-01 DIAGNOSIS — F329 Major depressive disorder, single episode, unspecified: Secondary | ICD-10-CM

## 2015-08-01 DIAGNOSIS — F32A Depression, unspecified: Secondary | ICD-10-CM

## 2015-08-02 NOTE — Progress Notes (Signed)
   THERAPY PROGRESS NOTE  Session Time: 45 minutes  Participation Level: Active  Behavioral Response: Well GroomedAlertNA  Type of Therapy: Individual Therapy  Treatment Goals addressed: Communication: Assessment  Interventions: Supportive  Summary: ARTAVIUS GANTER is a 54 y.o. male who presents with normal affect and was very attentive during the session. Avner shared with SWI that he thinks about suicide but does not have a plan to complete thoughts. Jamale shared that he has a sister that he use to be close with but is not anymore. Azael disclosed that his parents got a divorce when he was 16 years old. Arianna said that the divorce does not bother him anymore but he talked about it throughout the session. Campbell talked about his past with alcohol and that he had been through a [facility]". Jacek shared that he has had 5 DUI and is trying to get his license, but has not succeeded with that. Sadao talked about his struggles in his daily life with getting around.    Suicidal/Homicidal: Nowithout intent/plan  Therapist Response: Social Work Theatre manager ([place]) greeted [name], dispensing and went over Management consultant. SWI assessed Kenyatta's family dynamics. SWI used active listening when listening to Arnold talk about his family dynamics. SWI assessed Reynold about his alcohol use and treatment. SWI used empathy when Staffon talked about his struggles in his daily life. SWI was not able to finish assessment due to time constraints.   Plan: Return again in 1 week.  Diagnosis: Axis I: Pending    Axis II: No diagnosis    Allen Derry 08/02/2015  Note reviewed by Metta Clines, MSW, LCSW. Multiple notes created for same encounter in error.

## 2015-08-02 NOTE — Progress Notes (Signed)
Duplicate notes made for pt by error.  Notes reviewed by Metta Clines, MSW, LCSW on 08/05/2015.

## 2015-08-02 NOTE — Progress Notes (Signed)
   THERAPY PROGRESS NOTE  Session Time: 45 minutes  Participation Level: Active  Behavioral Response: Well GroomedAlertNA  Type of Therapy: Individual Therapy  Treatment Goals addressed: Communication: Assessment  Interventions: Supportive  Summary: [name] [name] is a 53 y.o. male who presents with bright affect and is very open to talking. [name] [name] started off the session by saying that he does think about suicide, but does not have any plans to do so. [name] [name] discussed his family saying that he has a sister that he used to be close to but is not much anymore. [name] [name] discussed his mother and father's divorce and how it doesn't bother him now, but he brought it up many times throughout the session. His father lives in [place] and is remarried, and his mother lives in [place] but is not married right now. [name] [name] spoke of past substance abuse treatment when he was younger at [facility] [name] [name] informed [name] [name] that he has 5 DUI's and is trying to get his licenses back, but has not had any success. [name] [name] reported that he struggles with rides and going to the store in his daily life. [name] [name] reported that his father was an alcoholic and his mother struggled with depression after her divorce.   Suicidal/Homicidal: Nowithout intent/plan  Therapist Response: Social Work Theatre manager ([name] [name]) greeted [name] [name] and explained confidentiality and the exceptions. [name] [name] assessed [name] [name]'s family dynamics to get a better idea of his environment. [name] [name] used reflections when [name] [name] talked about his family dynamics. [name] [name] used active listening when [name] talked about his family and his past. [name] [name] assessed [name] [name]'s alcohol intake and touched on what kind of treatment he has been through. [name] [name] used empathy when [name] [name] was talking about his struggle with his daily life. [name] [name] was not able to finish the assessment due to time constraints.  Plan: Return again in 1 week.  Diagnosis: Axis I: Pending    Axis II: No  diagnosis    [name] [name] 08/02/2015

## 2015-08-08 ENCOUNTER — Other Ambulatory Visit: Payer: No Typology Code available for payment source | Admitting: Licensed Clinical Social Worker

## 2015-08-15 ENCOUNTER — Ambulatory Visit (INDEPENDENT_AMBULATORY_CARE_PROVIDER_SITE_OTHER): Payer: Self-pay | Admitting: Licensed Clinical Social Worker

## 2015-08-15 DIAGNOSIS — Z1330 Encounter for screening examination for mental health and behavioral disorders, unspecified: Secondary | ICD-10-CM

## 2015-08-15 DIAGNOSIS — Z1342 Encounter for screening for global developmental delays (milestones): Principal | ICD-10-CM

## 2015-08-16 NOTE — Progress Notes (Signed)
   THERAPY PROGRESS NOTE  Session Time: One Hour  Participation Level: Active  Behavioral Response: NeatAlertDysthymic  Type of Therapy: Individual Therapy  Treatment Goals addressed: Diagnosis: Assessment  Interventions: Supportive  Summary: SHOTA DUREL is a 54 y.o. male who presents with an dysthymic mood and appropriate affect. Pilot stated that he has been through many different treatments, but moved on from them. Riquelme stated that he "just wanted the pain to stop". Calel stated that he was in counseling to "make the pain stop" . Kaveion also stated that he was not "scared to die". SWI asked Angel if he was having suicidal thoughts, Aldon stated that he was not. Evelyn stated that not having a license has affected him in a way he is not able to go out with friends or find a job. Kye shared he feels like he is an independent person, but he has to depend on other people for rides. Nathainel shared that he felt guilty that a good friend of his passed away and he was not able to go see him because another friend convinced him not to. Errol stated that he was a "bad person", but now he is trying to do more good in this life. Garik stated that he was on medication but felt like it was not doing any good for him.   Suicidal/Homicidal: NAwithout intent/plan  Therapist Response: Social work Theatre manager ([place]) greeted [name], dispensing in session. SWI used assessment to guide session, but was unable to finish due to time constraints. SWI used open ended questions when asking Gedeon about prior substance abuse and psychiatric treatment. SWI screened for depression and anxiety symptoms using the assessment. SWI screened for trauma symptoms using the assessment. SWI asked Younes if he was having suicidal thoughts. SWI used active listening skills when Packer was talking about how not having a license is a daily struggle for him. SWI used empathy when Leal was talking about his friend passing away and how he  felt about it.   Plan: Return again in one week.  Diagnosis: Axis I: Pending    Axis II: No diagnosis    [name], Student-SW 08/16/2015

## 2015-08-22 ENCOUNTER — Other Ambulatory Visit: Payer: No Typology Code available for payment source | Admitting: Licensed Clinical Social Worker

## 2015-09-05 ENCOUNTER — Ambulatory Visit (INDEPENDENT_AMBULATORY_CARE_PROVIDER_SITE_OTHER): Payer: Self-pay | Admitting: Licensed Clinical Social Worker

## 2015-09-05 DIAGNOSIS — F1020 Alcohol dependence, uncomplicated: Secondary | ICD-10-CM

## 2015-09-05 NOTE — Progress Notes (Signed)
Biopsychosocial Assessment Note  [name] [name] 55 y.o. 09/05/2015   Referred by: Self  PRESENTING PROBLEM Chief Complaint: Alcohol Use Disorder Moderate What are the main stressors in your life right now?Depression  2 and Work Problems   3  Describe a brief history of your present symptoms: [name] [name] reported that he is having problems feeling like he is independent in his life. [name] [name] has stated that he is an independent person, but he has to rely on individuals for rides, which results in "throwing it back in his face", which results in him feeling guilty. [name] [name] reported that he "wants the pain to stop". Social Work Intern ([name] [name]) asked what he meant by the pain to stop. He reported that he wanted to have his license back and be normal again. [name] [name] got a DUI in 1998 which has caused him to lose his license. [name] [name] reported that his father use to drink when he was younger. [name] [name] reported that in the past he did not have a good relationship with his father, but they are working on it now. [name] [name] reported that he has one sister, but they are not as close as they used to be because they live in different states. [name] [name] sees his mother on a regular basis, but does not feel like she is a support person for him. [name] [name] reported that he would like to have a better relationship with his father and be able to deal with what happened between them. [name] [name] reported that the way he deals with pain now is just to "deal with it". [name] [name] reported that he has coping skills, but he just wants to talk about his problems with someone. [name] [name] reported that he drinks everyday and does not see the need to stop. [name] [name] reported that he has been drunk a few times. [name] [name] reported that he started drinking when he was 55 years old. [name] [name] reported that he would not drink and drive if he got his license back.     How long have you had these symptoms?: 10+ years What effect have they had on your life?: [name] [name] reported that he  feels like his is hopeless, but tries to have a positive attitude. [name] [name] reports that he feels like he has to be dependent on people for rides since he cannot get any. [name] [name] reported that not having a licenses has effected him finding a job. He reported that he has found different jobs, but when he was asked if he drove he was turned down for the job. [name] [name] reported that this was frustrating for him, because he wants to work.    FAMILY ASSESSMENT Was the significant other/family member interviewed? NA If No, why?: NA Is significant other/family member supportive? NA Did significant other/family member express concerns for the patient? NA If Yes, describe: NA  Is significant other/family member willing to be part of treatment plan? NA Describe significant other/family member's perception of patient's illness: NA  Describe significant other/family member's perception of expectations with treatment: NA   [name] Have you ever been treated for a mental health problem? Yes  If Yes, when? 20 years ago , where? [name] one time and [name], by whom? NA  Are you currently seeing a therapist or counselor? No If Yes, whom? NA Have you ever had a mental health hospitalization? Yes If Yes, when? 20 years ago , where? [name], why? Depression and getting medicine correct, how many times? once Have you ever had suicidal  thoughts or attempted suicide? No If Yes, when? NA  Describe NA  Have you ever been treated with medication for a mental health problem? Yes If Yes, please list as completely as possible (name of medication, reason prescribed, and response: [name] Is there any history of mental health problems or substance abuse in your family? Yes If Yes, please explain (include information on parents, siblings, aunts/uncles, grandparents, cousins, etc.): Father is an alcoholic Has anyone in your  family been hospitalized for mental health problems? No If Yes, please explain (including who, where, and for what length of time): NA   MARITAL STATUS Are you presently: Divorced How many times have you been married? Once Dates of previous marriages: 5 years Do you have any concerns regarding marriage? Yes If Yes, please explain: [name] [name] reported that his previous marriage ended because they "drifted apart". He was 26 when he was married. He reported that there were accusations of cheating, but denied them.   Do you have any children? No If Yes, how many? NA Please list their sexes and ages: NA   LEISURE/RECREATION Describe how patient spends leisure time: [name] [name], building furniture, cooking, and problem solving.  SOCIAL AND FAMILY HISTORY Who lives in your current household? Himself Where were you born? [place] Where did you grow up? [name] Describe the household where you grew up: [name] [name] reported that his parents would argue when he was growing up. [name] [name] reported that his parents got divorced when he was 85 years old, and he felt like he needed his dad at that time and he was not there. [name] [name] reported that his mother had different boyfriends and that they did not get a long.   Do you have siblings, step/half siblings? Yes If Yes, please list names, sex and ages: Sister, [name] [name] Are your parents still living? Yes If No, what was the cause of death? NA If Yes, father's age: 46   His health: He is slowing down and getting sick. If Yes, mother's age: 41 Her health: She is slowing down, but is doing okay.  Where do your parents live? Mother lives in [name] and father lives in [name] Do you see them often? [name] [name] reported that he sees his mother almost daily. [name] [name] reported that he sees his dad about once a year, but talks to him on the phone every couple of weeks. If No, why not? [name] [name] reported that he did not have a good relationship with his father  in the past. They went through a period where they did not speak for 10 years. But, he reported that they are working on their relationship.  Are your parents separated/divorced? Yes If Yes, approximately when? When he was 55 years old Have you ever been exposed to any form of abuse? No If Yes: NA Did the abuse happen recently, or in the past? NA Were you the victim or offender, please explain: NA  Are you having problems with any member or your family? Yes If Yes, please explain: Sohum reported that his is still struggling with the relationship with his dad. He reported that his dad was not there when he needed him, and he is still struggling with that. His father lives in [name] and is very rich. Omega also reported that his father was an alcoholic.  What Religion are you? Christian Do you have any cultural or religious beliefs which could impact your treatment? No If Yes, please explain (including customs, celebrations,  attitude towards alcohol and drugs, authority in family, etc):  NA  Have you ever been in the [employer]? NA If Yes, when? NA r how long? NA Were you ever in active combat? NA If Yes, when? NAor how long? NA Were there any lasting effects on you? NA If Yes, please explain: NA  Why did you leave the [place] (include type of discharge, disciplinary action, substance abuse, or any Post Traumatic Stress Symptoms): NA  Do you have any legal problems/involvements? Yes If Yes, please explain: Patti reported that he has gotten 5 DUI's in his lifetime. This has prevented him from driving for 16 years. Maximilliano reported that he is trying to get his license back, but keeps getting denied from it. Haidyn also reported that he has had charges in the past including breaking and entering, distributing cocaine and crack, and assault on an officer. He reported hat he has been raided 3 different times when distributing cocaine and crack.    EDUCATIONAL BACKGROUND How many grades have  you completed? some college Do you hold any Degrees? Yes If Yes, in what? 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 NA If Yes, were these problems behavioral, attentional, or due to learning difficulties? NA Were any medications ever prescribed for these problems? NA If Yes, what were the medications, including the dosage, how long you took these and who prescribed them? NA   WORK HISTORY Do you work? Myquan reported that he does side work with an Clinical biochemist but is looking for a full time position in a company, If Yes, what is your occupation? NA How long have you been employed there? Few weeks   Name of employer: NA Do you enjoy your present job? Yes What is your previous work history? Malcum reported he had good jobs when he had a license Are you having trouble on your present job or had difficulties holding a job? Yes If Yes, please explain: Reagan reported that he would drink and lose his job. He also reported that not having a license currently is preventing him from finding a good job.   Does your spouse work? NA If Yes, where and for how long? NA Are you under financial stress? Yes If Yes, please explain: Sammie would consider himself in the poverty level. He reported he is on food stamps but does not want to be. He would like to get a full time job.  Financial Resources  Patient is: Self supportive (no assistance) No    Requires referral for financial assistance Yes  Requires referral for credit counseling No  Current situation affects financial situation Yes  Adolescent/child in need of financial support No  Is there anything else you would like to tell us? No  303.90 (F10.20) Alcohol Use Disorder Moderate  [name], Student-SW 09/05/2015

## 2015-09-12 ENCOUNTER — Ambulatory Visit: Payer: Self-pay | Attending: Family Medicine | Admitting: Family Medicine

## 2015-09-12 ENCOUNTER — Encounter: Payer: Self-pay | Admitting: Family Medicine

## 2015-09-12 VITALS — BP 135/82 | HR 81 | Temp 99.1°F | Resp 16 | Ht 71.0 in | Wt 181.0 lb

## 2015-09-12 DIAGNOSIS — Z6825 Body mass index (BMI) 25.0-25.9, adult: Secondary | ICD-10-CM | POA: Insufficient documentation

## 2015-09-12 DIAGNOSIS — F17210 Nicotine dependence, cigarettes, uncomplicated: Secondary | ICD-10-CM | POA: Insufficient documentation

## 2015-09-12 DIAGNOSIS — N529 Male erectile dysfunction, unspecified: Secondary | ICD-10-CM | POA: Insufficient documentation

## 2015-09-12 DIAGNOSIS — Z79899 Other long term (current) drug therapy: Secondary | ICD-10-CM | POA: Insufficient documentation

## 2015-09-12 DIAGNOSIS — E118 Type 2 diabetes mellitus with unspecified complications: Secondary | ICD-10-CM

## 2015-09-12 DIAGNOSIS — E1142 Type 2 diabetes mellitus with diabetic polyneuropathy: Secondary | ICD-10-CM

## 2015-09-12 DIAGNOSIS — E1121 Type 2 diabetes mellitus with diabetic nephropathy: Secondary | ICD-10-CM | POA: Insufficient documentation

## 2015-09-12 LAB — GLUCOSE, POCT (MANUAL RESULT ENTRY): POC Glucose: 158 mg/dl — AB (ref 70–99)

## 2015-09-12 LAB — POCT GLYCOSYLATED HEMOGLOBIN (HGB A1C): HEMOGLOBIN A1C: 7.9

## 2015-09-12 MED ORDER — PREGABALIN 100 MG PO CAPS: 100.0000 mg | ORAL_CAPSULE | Freq: Three times a day (TID) | ORAL | Status: DC

## 2015-09-12 MED ORDER — VARDENAFIL HCL 10 MG PO TABS: 10.0000 mg | ORAL_TABLET | Freq: Every day | ORAL | Status: DC | PRN

## 2015-09-12 MED ORDER — GABAPENTIN 300 MG PO CAPS: 300.0000 mg | ORAL_CAPSULE | Freq: Three times a day (TID) | ORAL | Status: DC

## 2015-09-12 NOTE — Progress Notes (Signed)
Subjective:  Patient ID: [name] [name], male    DOB: 05/21/1961  Age: 55 y.o. MRN: [id number]  CC: Diabetes and Cirrhosis   HPI [name] [name] presents for    1. CHRONIC DIABETES  Disease Monitoring  Blood Sugar Ranges: not checking   Polyuria: no   Visual problems: no   Medication Compliance: no, no meds   Medication Side Effects  Hypoglycemia: no   2. ED: for years. [place] with viagra. HA with cialis. Interested in [place]. Difficulty getting and maintaining erections.  3. Peripheral neuropathy: both feet numb. Gabapentin helped some. Would like to try lyrica. No meds at the moment.     Social History  Substance Use Topics  . Smoking status: Current Every Day Smoker -- 0.50 packs/day for 35 years    Last Attempt to Quit: 03/26/2015  . Smokeless tobacco: Never Used  . Alcohol Use: 12.6 oz/week    21 Standard drinks or equivalent per week     Comment: 21 beers a week   Outpatient Prescriptions Prior to Visit  Medication Sig Dispense Refill  . albuterol (PROVENTIL HFA;VENTOLIN HFA) 108 (90 BASE) MCG/ACT inhaler Inhale 2 puffs into the lungs every 6 (six) hours as needed for wheezing or shortness of breath. 3 Inhaler 3  . citalopram (CELEXA) 20 MG tablet Take 20 mg by mouth daily.    Marland Kitchen glucose blood test strip Use as instructed (Patient not taking: Reported on 06/13/2015) 100 each 12  . glucose monitoring kit (FREESTYLE) monitoring kit 1 each by Does not apply route as needed for other. Used as directed (Patient not taking: Reported on 06/13/2015) 1 each 0  . Lancets (FREESTYLE) lancets Use as instructed (Patient not taking: Reported on 06/13/2015) 100 each 12  . tadalafil (CIALIS) 20 MG tablet Take 0.5-1 tablets (10-20 mg total) by mouth daily as needed for erectile dysfunction. 30 tablet 3  . zolpidem (AMBIEN) 10 MG tablet Take 10 mg by mouth at bedtime as needed for sleep. At 2000     No facility-administered medications prior to visit.    ROS Review of  Systems  Constitutional: Negative for fever, chills, fatigue and unexpected weight change.  Eyes: Negative for visual disturbance.  Respiratory: Negative for cough and shortness of breath.   Cardiovascular: Negative for chest pain, palpitations and leg swelling.  Gastrointestinal: Negative for nausea, vomiting, abdominal pain, diarrhea, constipation and blood in stool.  Endocrine: Negative for polydipsia, polyphagia and polyuria.  Musculoskeletal: Negative for myalgias, back pain, arthralgias, gait problem and neck pain.  Skin: Negative for rash.  Allergic/Immunologic: Negative for immunocompromised state.  Hematological: Negative for adenopathy. Does not bruise/bleed easily.  Psychiatric/Behavioral: Negative for suicidal ideas, sleep disturbance and dysphoric mood. The patient is not nervous/anxious.     Objective:  BP 135/82 mmHg  Pulse 81  Temp(Src) 99.1 F (37.3 C) (Oral)  Resp 16  Ht '5\' 11"'  (1.803 m)  Wt 181 lb (82.101 kg)  BMI 25.26 kg/m2  SpO2 96%  BP/Weight 09/12/2015 06/13/2015 2/87/8676  Systolic BP 720 947 096  Diastolic BP 82 83 85  Wt. (Lbs) 181 178 -  BMI 25.26 24.84 -    Physical Exam  Constitutional: He appears well-developed and well-nourished. No distress.  HENT:  Head: Normocephalic and atraumatic.  Neck: Normal range of motion. Neck supple.  Cardiovascular: Normal rate, regular rhythm, normal heart sounds and intact distal pulses.   Pulmonary/Chest: Effort normal and breath sounds normal.  Musculoskeletal: He exhibits no edema.  Neurological: He is  alert.  Skin: Skin is warm and dry. No rash noted. No erythema.  Psychiatric: He has a normal mood and affect.    Lab Results  Component Value Date   HGBA1C 7.90 09/12/2015   CBG 158 Assessment & Plan:   Problem List Items Addressed This Visit    Diabetes type 2, controlled ([place]) - Primary (Chronic)   Relevant Orders   HgB A1c (Completed)   Glucose (CBG) (Completed)   Ambulatory referral to  Ophthalmology   Erectile dysfunction   Relevant Medications   vardenafil (LEVITRA) 10 MG tablet    Other Visit Diagnoses    Diabetic peripheral neuropathy associated with type 2 diabetes mellitus (HCC)        Relevant Medications    pregabalin (LYRICA) 100 MG capsule    gabapentin (NEURONTIN) 300 MG capsule       No orders of the defined types were placed in this encounter.    Follow-up: No Follow-up on file.   Boykin Nearing MD

## 2015-09-12 NOTE — Progress Notes (Signed)
F/U DM Swelling feet, ED Pain scale #3 Tobacco user 3 cigarette per day  No suicidal thought in the past two weeks

## 2015-09-12 NOTE — Patient Instructions (Addendum)
Gio was seen today for diabetes and cirrhosis.  Diagnoses and all orders for this visit:  Controlled type 2 diabetes mellitus with complication, without long-term current use of insulin (HCC) -     HgB A1c -     Glucose (CBG) -     Ambulatory referral to Ophthalmology  Erectile dysfunction, unspecified erectile dysfunction type -     Discontinue: vardenafil (LEVITRA) 10 MG tablet; Take 1 tablet (10 mg total) by mouth daily as needed for erectile dysfunction. -     vardenafil (LEVITRA) 10 MG tablet; Take 1 tablet (10 mg total) by mouth daily as needed for erectile dysfunction.  Diabetic peripheral neuropathy associated with type 2 diabetes mellitus (HCC) -     pregabalin (LYRICA) 100 MG capsule; Take 1 capsule (100 mg total) by mouth 3 (three) times daily.   F/u in 3 months for diabetes  Dr. Adrian Blackwater

## 2015-09-12 NOTE — Assessment & Plan Note (Signed)
Declined Add exercise No meds at this time

## 2015-09-13 ENCOUNTER — Other Ambulatory Visit: Payer: Self-pay

## 2015-09-13 DIAGNOSIS — E1142 Type 2 diabetes mellitus with diabetic polyneuropathy: Secondary | ICD-10-CM

## 2015-09-13 MED ORDER — PREGABALIN 100 MG PO CAPS: 100.0000 mg | ORAL_CAPSULE | Freq: Three times a day (TID) | ORAL | Status: DC

## 2015-09-19 ENCOUNTER — Other Ambulatory Visit: Payer: No Typology Code available for payment source | Admitting: Licensed Clinical Social Worker

## 2015-09-23 MED FILL — GABAPENTIN 300 MG CAPSULE: 300 | 30 days supply | Qty: 90 | Fill #0

## 2015-09-26 ENCOUNTER — Ambulatory Visit (INDEPENDENT_AMBULATORY_CARE_PROVIDER_SITE_OTHER): Payer: Self-pay | Admitting: Licensed Clinical Social Worker

## 2015-09-26 DIAGNOSIS — F1010 Alcohol abuse, uncomplicated: Secondary | ICD-10-CM

## 2015-09-26 NOTE — Progress Notes (Signed)
   THERAPY PROGRESS NOTE  Session Time: 45 minutes  Participation Level: Active  Behavioral Response: NeatAlertHopeless  Type of Therapy: Individual Therapy  Treatment Goals addressed: Coping  Interventions: Supportive  Summary: LUIAN NERISON is a 55 y.o. male who presents with hopeless mood and appropriate affect. Leone reported that he did not get the job he interviewed for, and felt hopeless because of this. Nashton reported that he has loss interest in things he use to do including cleaning and taking care of the house. He also reported that he is starting to lose interest in looking for a job. Kale reported that he has increased his drinking and smoking since the denial of the job. Jabier reported that he is "slowing committing suicide" with his drinking and smoking. SWI asked if Barkley had thoughts of suicide, and Dubois reported that he did not. Jebidiah said that he does not have any coping skills other than drinking. Remy reported that he has tried deep breathing but it didn't do much for him. Rueben said that he would try out writing to see if that would help any. Brayton seemed to be okay with the homework and said he would try it. Burach reported that it was hard to talk about his alcohol use and job situation. Flem reported that he gets frustrated with his mother because she talks with him about his alcohol use. He said that he might have to move back in with his mom. Kirtus reported that if he had to move back in with his mom then he will probably feel more depress and isolate more.   Suicidal/Homicidal: Nowithout intent/plan  Therapist Response: Social Work Intern ([place]) used open ended questions when Ostin was talking about his feelings of hopelessness. SWI used empathy when Bronislaus spoke of not getting the job he interviewed for. SWI redirected Jayda consistently throughout the session when talking about difficult subjects. SWI used active listening skills when Monnie would  talk about his alcohol use. SWI assigned homework of writing 2-3 times a week about how Ishaaq was feeling and what Trenidad was thinking.  Plan: Return again in 2 weeks.  Diagnosis: Axis I: Alcohol Abuse    Axis II: No diagnosis    [name], Student-SW 09/26/2015

## 2015-10-10 ENCOUNTER — Other Ambulatory Visit: Payer: No Typology Code available for payment source | Admitting: Licensed Clinical Social Worker

## 2015-10-17 ENCOUNTER — Other Ambulatory Visit: Payer: No Typology Code available for payment source | Admitting: Licensed Clinical Social Worker

## 2015-10-24 ENCOUNTER — Ambulatory Visit (INDEPENDENT_AMBULATORY_CARE_PROVIDER_SITE_OTHER): Payer: Self-pay | Admitting: Licensed Clinical Social Worker

## 2015-10-24 DIAGNOSIS — F1010 Alcohol abuse, uncomplicated: Secondary | ICD-10-CM

## 2015-10-24 NOTE — Progress Notes (Signed)
   THERAPY PROGRESS NOTE  Session Time: 60 minutes  Participation Level: Active  Behavioral Response: Well GroomedAlertEuthymic  Type of Therapy: Individual Therapy  Treatment Goals addressed: Coping  Interventions: CBT  Summary: NASER FALARDEAU is a 55 y.o. male who presents with euthymic mood and appropriate affect. Dezmin reported that he had received a car from his sister because he helped her do some work. Andrea reported that he does not have his licenses due to the [place], but is hoping to get a hearing soon to change that. Percell reported that he felt better, emotionally and mentally, than last session. Uryah spoke about trying to find a job. Corley reported he had cut down on his drinking more than last session. Trenten reported that he had tried guided imagery before and it did help him relax, but he already knew how to relax. Freemont reported more about his parents divorce and how that effected him as a child. He said that his mother tries to control him and that he just ignores her. Dontavis brought up his ex wife and says that he gets lonely, which fuels his drinking. Kippy was receptive to the CBT and is going to start by targeting his first thoughts and bring them in next time. Martinez reported that he is going to start a journal with his thoughts. Khaleb reported he has spent all of his life using jokes to cope with pain. When he was younger he would look for someone going through pain and try to relate with them.   Suicidal/Homicidal: Nowithout intent/plan  Therapist Response: Social Work Theatre manager ([place]) greeted Morland and asked him how thing had been. SWI used active listening when Cephas was speaking about how he had been and his job Secretary/administrator. SWI used guided imagery with Dayvion to help him relax. SWI used empathy when Shloime spoke about his family. SWI introduced CBT to [place] and gave him an example.   Plan: Return again in 2 weeks.  Diagnosis: Axis I: Alcohol Abuse    Axis II:  No diagnosis    [name], Student-SW 10/24/2015

## 2015-10-25 ENCOUNTER — Other Ambulatory Visit: Payer: Self-pay | Admitting: Family Medicine

## 2015-10-29 ENCOUNTER — Encounter: Payer: Self-pay | Admitting: Clinical

## 2015-10-29 NOTE — Progress Notes (Signed)
Depression screen [facility] 2/9 09/12/2015 06/13/2015 04/02/2015 01/25/2015 01/10/2015  Decreased Interest 2 0 0 0 1  Down, Depressed, Hopeless 2 0 0 0 1  PHQ - 2 Score 4 0 0 0 2  Altered sleeping 2 - - - 1  Tired, decreased energy 2 - - - 1  Change in appetite 2 - - - 0  Feeling bad or failure about yourself  2 - - - 0  Trouble concentrating 1 - - - 1  Moving slowly or fidgety/restless 1 - - - 0  Suicidal thoughts 0 - - - 0  PHQ-9 Score 14 - - - 5    GAD 7 : Generalized Anxiety Score 09/12/2015  Nervous, Anxious, on Edge 1  Control/stop worrying 1  Worry too much - different things 1  Trouble relaxing 2  Restless 1  Easily annoyed or irritable 1  Afraid - awful might happen 1  Total GAD 7 Score 8

## 2015-11-05 MED FILL — GABAPENTIN 300 MG CAPSULE: 300 | 30 days supply | Qty: 90 | Fill #0

## 2015-11-07 ENCOUNTER — Encounter: Payer: Self-pay | Admitting: Gastroenterology

## 2015-11-07 ENCOUNTER — Ambulatory Visit (INDEPENDENT_AMBULATORY_CARE_PROVIDER_SITE_OTHER): Payer: Self-pay | Admitting: Licensed Clinical Social Worker

## 2015-11-07 DIAGNOSIS — F1010 Alcohol abuse, uncomplicated: Secondary | ICD-10-CM

## 2015-11-07 NOTE — Progress Notes (Signed)
   THERAPY PROGRESS NOTE  Session Time: 60 minutes  Participation Level: Active  Behavioral Response: Well GroomedAlertEuthymic  Type of Therapy: Individual Therapy  Treatment Goals addressed: Coping  Interventions: CBT  Summary: [name] [name] is a 55 y.o. male who presents with euthymic mood and appropriate affect. 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 [name] was very disappointed and upset about having to turn down this job. [name] talked about different pros and cons which influenced SWI to do a pro and cons list of taking the job. [name] reported that he has other jobs lined up to take. [name] reported that he is going to go visit his dad and have other work soon. [name] reported that he wants companionship and feels not having it is depressing. [name] reported that he felt like since he has been driving he has had ore interest during the day. [name] took home a cognitive behavioral therapy worksheet to do for next time. [name] was receptive to taking the PHQ-9 and scored an 8.   Suicidal/Homicidal: Nowithout intent/plan  Therapist Response: Social Work Theatre manager ([place]) greeted [name], dispensing and asked him how he has been doing. SWI administered the PHQ-9 to screen on how [name]'s depression is. SWI went over CBT again and started with trying to identify the first thought. SWI used a CBT worksheet to help guide the session. SWI helped [name] form a pro and con list about taking a new job. SWI used active listening while [name] was explaining his pros and cons. SWI used empathy when [name] was talking about how hard it is to get a job without a license.   Plan: Return again in 2 weeks.  Diagnosis: Axis I: Alcohol Abuse    Axis II: No diagnosis    [name], Student-SW 11/07/2015

## 2015-11-21 ENCOUNTER — Other Ambulatory Visit: Payer: No Typology Code available for payment source | Admitting: Licensed Clinical Social Worker

## 2015-11-28 ENCOUNTER — Other Ambulatory Visit: Payer: No Typology Code available for payment source | Admitting: Licensed Clinical Social Worker

## 2015-12-09 ENCOUNTER — Encounter: Payer: Self-pay | Admitting: Pharmacist Clinician (PhC)/ Clinical Pharmacy Specialist

## 2015-12-09 ENCOUNTER — Other Ambulatory Visit: Payer: Self-pay

## 2015-12-09 ENCOUNTER — Other Ambulatory Visit: Payer: Self-pay | Admitting: Pharmacist Clinician (PhC)/ Clinical Pharmacy Specialist

## 2015-12-09 DIAGNOSIS — B182 Chronic viral hepatitis C: Secondary | ICD-10-CM

## 2015-12-09 NOTE — Progress Notes (Signed)
Patient ID: [name] [name], male   DOB: March 01, 1961, 55 y.o.   MRN: [id number] [name] [name] walked in today to ask about doing his hep C VL. He finished in oct 2016. Dr. Henreitta Leber note mentioned to do a 24 wks SVR which is about now. We are going to get his VL today and set him up a f/u with Dr. Linus Salmons in May.

## 2015-12-10 LAB — HEPATITIS C RNA QUANTITATIVE: HCV Quantitative: NOT DETECTED IU/mL (ref <15)

## 2015-12-12 ENCOUNTER — Ambulatory Visit (HOSPITAL_COMMUNITY): Admission: RE | Admit: 2015-12-12 | Payer: Self-pay | Source: Ambulatory Visit

## 2015-12-12 ENCOUNTER — Ambulatory Visit (INDEPENDENT_AMBULATORY_CARE_PROVIDER_SITE_OTHER): Payer: Self-pay | Admitting: Licensed Clinical Social Worker

## 2015-12-12 DIAGNOSIS — F1010 Alcohol abuse, uncomplicated: Secondary | ICD-10-CM

## 2015-12-12 NOTE — Progress Notes (Signed)
   THERAPY PROGRESS NOTE  Session Time: 60 minutes  Participation Level: Active  Behavioral Response: Well GroomedAlertEuthymic  Type of Therapy: Individual Therapy  Treatment Goals addressed: Coping  Interventions: CBT  Summary: MATTHIAS COWSERT is a 55 y.o. male who presents with euthymic mood and appropriate affect. Malic brought in CBT worksheets from last session and was able to pinpoint emotions and thoughts. Bilbo stated that he feels lonely in the evening and is "self destructive" in the evenings. SWI asked what "self destructive" meant to Flozell and reported the drinking is. Princemichael reported that things have been going well for him and that he is thinking about moving in with his mom. Humphrey reported that when he moves back in with his mother he will stop his self destructive behavior. SWI asked what would be different moving back to his mother's house? Raiquan reported that he was not sure because he still drinks with his mom around. Broch reported that he does not have any friends and that he wants to be more social. Benino reported that he is still depressed and goes to bed early so he does not have to feel lonely. Ellwyn reported that he would try to start reading a book in the evenings to try not to feel lonely. Jacobb reported that he does not feel successful in his life. Ruben mentioned that having children would be a sign of a successful life, but he does not see that happening. Layman also mentioned some things that does make him successful including being good with children and having different experiences. Gianna reported that he is not feeling suicidal and would never commit suicide. Kipton reported that he wanted to stop smoking because he is tried of coughing. Miki reported that he stopped smoking last year when he visited his dad and this year would do the same. SWI asked Franciscojavier what he was going to do to make sure he stopped when he came home. Garr did not have a concrete  answer. Ilijah reported that he is still looking for a job.  Suicidal/Homicidal: Nowithout intent/plan  Therapist Response: Social Work Intern ([place]) talked with Jeylan about termination and what he would like to do in the future. SWI used a CBT worksheet to help guide the session. SWI took a previous topic that was in a past session with him and asked Mandeep if he had been able to pinpoint his first thought. SWI also used a what is success worksheet to guide the session. SWI asked Augusto what he thought what success looked like in his life and what he has done that is successful. SWI asked Drelyn what he thought success looked like because it is different for everyone. SWI and Iona Beard set a concrete goal for the last session. SWI asked Lindle how his depression has been doing. SWI asked Jeanpierre if he was feeling suicidal.   Plan: Return again in 2 weeks.  Diagnosis: Axis I: See current hospital problem list    Axis II: No diagnosis    [name], Student-SW 12/12/2015

## 2015-12-23 ENCOUNTER — Other Ambulatory Visit: Payer: Self-pay | Admitting: Family Medicine

## 2015-12-23 NOTE — Telephone Encounter (Signed)
Discontinue gabapentin Patient to transition to lyrica

## 2015-12-26 ENCOUNTER — Ambulatory Visit (INDEPENDENT_AMBULATORY_CARE_PROVIDER_SITE_OTHER): Payer: Self-pay | Admitting: Licensed Clinical Social Worker

## 2015-12-26 DIAGNOSIS — F1010 Alcohol abuse, uncomplicated: Secondary | ICD-10-CM

## 2015-12-26 NOTE — Progress Notes (Signed)
   THERAPY PROGRESS NOTE  Session Time: 60 minutes  Participation Level: Active  Behavioral Response: Well GroomedAlertEuthymic  Type of Therapy: Individual Therapy  Treatment Goals addressed: Coping  Interventions: CBT and Supportive  Summary: CASSIE FINCKE is a 55 y.o. male who presents with euthymic mood and appropriate affect. Nawaf reported that his trip with his dad went well and that he had fun. Alejandra reported that his dad told him that he was disappointed in his past and has not seen the progress that he has made to improve his life. Babak reported that it upset him that he has not. Elena was able to do the PHQ-9 and said that since he went on vacation he has felt better. Dashiell reported that he does a lot of good things, but "everyone has a bad side to them". Ozell reported that some of the good social skills he has are adventurous, open to people, and he is caring. Jaydem reported some bad social skills being not responding to conflict well and hanging out with "the wrong people". Parry reported that "people that are bad are attractive to other people that do bad things". Stephaun reported that he was trying to do better in his life. Chucky reported that he was lonely and that he is starting to join groups soon. Taraji was not able to identify his first thought for being lonely. Burhan reported that he was still upset about his parents divorce and that he tries to just ignore it. Jahquan reported that he was thinking about moving in with his mom to take care of her.   Suicidal/Homicidal: Nowithout intent/plan  Therapist Response: Social Work Intern ([place]) started the session with asking about Eoin's trip. SWI administered the PHQ-9 to [facility] and he scored a 8. SWI asked about what was good and bad about Emilo's social skills. SWI had Paramveer think of examples of how his social skills were bad. SWI had to redirect Ibrahima many times during the session. SWI asked about Rosaire's  loneliness and used CBT to try to get to his first thought when he is lonely. SWI used reflections when Irwin was talking about his loneliness. SWI used active listening when Audwin was talking about how his mom and dad's divorce affects him. SWI used empathy when Yugo was talking about his family life. SWI teminated with Iona Beard and had him schedule with Metta Clines.   Plan: Return again in 2 weeks to see Metta Clines.  Diagnosis: Axis I: See current hospital problem list    Axis II: No diagnosis    [name], Student-SW 12/26/2015

## 2015-12-30 ENCOUNTER — Ambulatory Visit: Payer: Self-pay | Attending: Family Medicine

## 2016-01-02 ENCOUNTER — Encounter: Payer: Self-pay | Admitting: Family Medicine

## 2016-01-02 ENCOUNTER — Other Ambulatory Visit: Payer: Self-pay | Admitting: Internal Medicine

## 2016-01-02 ENCOUNTER — Ambulatory Visit: Payer: Self-pay | Attending: Family Medicine | Admitting: Family Medicine

## 2016-01-02 VITALS — BP 148/79 | HR 80 | Temp 98.9°F | Resp 16 | Ht 71.0 in | Wt 184.0 lb

## 2016-01-02 DIAGNOSIS — E119 Type 2 diabetes mellitus without complications: Secondary | ICD-10-CM | POA: Insufficient documentation

## 2016-01-02 DIAGNOSIS — R7989 Other specified abnormal findings of blood chemistry: Secondary | ICD-10-CM

## 2016-01-02 DIAGNOSIS — L989 Disorder of the skin and subcutaneous tissue, unspecified: Secondary | ICD-10-CM | POA: Insufficient documentation

## 2016-01-02 DIAGNOSIS — E1165 Type 2 diabetes mellitus with hyperglycemia: Secondary | ICD-10-CM

## 2016-01-02 DIAGNOSIS — Z79899 Other long term (current) drug therapy: Secondary | ICD-10-CM | POA: Insufficient documentation

## 2016-01-02 DIAGNOSIS — N529 Male erectile dysfunction, unspecified: Secondary | ICD-10-CM | POA: Insufficient documentation

## 2016-01-02 DIAGNOSIS — E291 Testicular hypofunction: Secondary | ICD-10-CM

## 2016-01-02 DIAGNOSIS — E1169 Type 2 diabetes mellitus with other specified complication: Secondary | ICD-10-CM | POA: Insufficient documentation

## 2016-01-02 DIAGNOSIS — F17210 Nicotine dependence, cigarettes, uncomplicated: Secondary | ICD-10-CM | POA: Insufficient documentation

## 2016-01-02 DIAGNOSIS — R030 Elevated blood-pressure reading, without diagnosis of hypertension: Secondary | ICD-10-CM | POA: Insufficient documentation

## 2016-01-02 DIAGNOSIS — IMO0001 Reserved for inherently not codable concepts without codable children: Secondary | ICD-10-CM

## 2016-01-02 LAB — LIPID PANEL
Cholesterol: 164 mg/dL (ref 125–200)
HDL: 38 mg/dL — AB (ref >=40)
LDL CALC: 90 mg/dL (ref <130)
Total CHOL/HDL Ratio: 4.3 Ratio (ref <=5.0)
Triglycerides: 178 mg/dL — ABNORMAL HIGH (ref <150)
VLDL: 36 mg/dL — ABNORMAL HIGH (ref <30)

## 2016-01-02 LAB — COMPLETE METABOLIC PANEL WITH GFR
ALBUMIN: 4.1 g/dL (ref 3.6–5.1)
ALK PHOS: 61 U/L (ref 40–115)
ALT: 38 U/L (ref 9–46)
AST: 26 U/L (ref 10–35)
BILIRUBIN TOTAL: 0.4 mg/dL (ref 0.2–1.2)
BUN: 15 mg/dL (ref 7–25)
CO2: 25 mmol/L (ref 20–31)
CREATININE: 0.87 mg/dL (ref 0.70–1.33)
Calcium: 9.2 mg/dL (ref 8.6–10.3)
Chloride: 102 mmol/L (ref 98–110)
GFR, Est African American: >89 mL/min (ref >=60)
GLUCOSE: 153 mg/dL — AB (ref 65–99)
Potassium: 4.1 mmol/L (ref 3.5–5.3)
SODIUM: 136 mmol/L (ref 135–146)
TOTAL PROTEIN: 6.6 g/dL (ref 6.1–8.1)

## 2016-01-02 LAB — GLUCOSE, POCT (MANUAL RESULT ENTRY): POC Glucose: 180 mg/dl — AB (ref 70–99)

## 2016-01-02 LAB — POCT GLYCOSYLATED HEMOGLOBIN (HGB A1C): HEMOGLOBIN A1C: 8.3

## 2016-01-02 MED ORDER — TADALAFIL 10 MG PO TABS: 10.0000 mg | ORAL_TABLET | Freq: Every day | ORAL | Status: DC | PRN

## 2016-01-02 MED ORDER — GLIPIZIDE 10 MG PO TABS: 10.0000 mg | ORAL_TABLET | Freq: Two times a day (BID) | ORAL | Status: DC

## 2016-01-02 MED FILL — !VENTOLIN HFA INHALER: 108 (90 BAS | 30 days supply | Qty: 18 | Fill #0

## 2016-01-02 MED FILL — glipiZIDE 10 MG TABS: 10 | 30 days supply | Qty: 60 | Fill #0

## 2016-01-02 NOTE — Patient Instructions (Addendum)
[name] [name] was seen today for diabetes.  Diagnoses and all orders for this visit:  Uncontrolled type 2 diabetes mellitus without complication, without long-term current use of insulin (HCC) -     POCT glycosylated hemoglobin (Hb A1C) -     POCT glucose (manual entry) -     glipiZIDE (GLUCOTROL) 10 MG tablet; Take 1 tablet (10 mg total) by mouth 2 (two) times daily before a meal. -     COMPLETE METABOLIC PANEL WITH GFR -     Lipid Panel  Erectile dysfunction, unspecified erectile dysfunction type -     tadalafil (CIALIS) 10 MG tablet; Take 1 tablet (10 mg total) by mouth daily as needed for erectile dysfunction. For PASS -     Ambulatory referral to Urology -     Testosterone    F/u in 3 months for diabetes   Dr. Adrian Blackwater

## 2016-01-02 NOTE — Progress Notes (Signed)
Subjective:  Patient ID: [name] [name], male    DOB: 1960/11/25  Age: 55 y.o. MRN: [id number]  CC: Diabetes   HPI [name] [name] presents for   1. CHRONIC DIABETES  Disease Monitoring  Blood Sugar Ranges: not checking   Polyuria: no   Visual problems: no   Medication Compliance: no meds  Medication Side Effects  Hypoglycemia: no   Preventitive Health Care  Eye Exam: due   Foot Exam: done today   Diet pattern: eating canned foods, higher carbs   Exercise: minimal   2. Elevated BP: smoking. No HA, CP or SOB.   3. ED dysfunction: cannot afford levitra. Request cialis. Request urology referral. Has nocturia. Trouble getting an erection.   4. Skin lesions: x 2 on upper back and posterior neck. Present x 5 months. He has picked at the area. Not tender or bleeding. No drainage.   Social History  Substance Use Topics  . Smoking status: Current Every Day Smoker -- 0.50 packs/day for 35 years    Last Attempt to Quit: 03/26/2015  . Smokeless tobacco: Never Used  . Alcohol Use: 12.6 oz/week    21 Standard drinks or equivalent per week     Comment: 21 beers a week    Outpatient Prescriptions Prior to Visit  Medication Sig Dispense Refill  . albuterol (PROVENTIL HFA;VENTOLIN HFA) 108 (90 BASE) MCG/ACT inhaler Inhale 2 puffs into the lungs every 6 (six) hours as needed for wheezing or shortness of breath. 3 Inhaler 3  . glucose blood test strip Use as instructed 100 each 12  . glucose monitoring kit (FREESTYLE) monitoring kit 1 each by Does not apply route as needed for other. Used as directed 1 each 0  . Lancets (FREESTYLE) lancets Use as instructed 100 each 12  . pregabalin (LYRICA) 100 MG capsule Take 1 capsule (100 mg total) by mouth 3 (three) times daily. 270 capsule 3  . zolpidem (AMBIEN) 10 MG tablet Take 10 mg by mouth at bedtime as needed for sleep. At 2000    . vardenafil (LEVITRA) 10 MG tablet Take 1 tablet (10 mg total) by mouth daily as needed for erectile  dysfunction. (Patient not taking: Reported on 01/02/2016) 10 tablet 0   No facility-administered medications prior to visit.    ROS Review of Systems  Constitutional: Negative for fever, chills, fatigue and unexpected weight change.  Eyes: Negative for visual disturbance.  Respiratory: Negative for cough and shortness of breath.   Cardiovascular: Negative for chest pain, palpitations and leg swelling.  Gastrointestinal: Negative for nausea, vomiting, abdominal pain, diarrhea, constipation and blood in stool.  Endocrine: Negative for polydipsia, polyphagia and polyuria.  Genitourinary:       Trouble with erection   Musculoskeletal: Negative for myalgias, back pain, arthralgias, gait problem and neck pain.  Skin: Negative for rash.  Allergic/Immunologic: Negative for immunocompromised state.  Hematological: Negative for adenopathy. Does not bruise/bleed easily.  Psychiatric/Behavioral: Negative for suicidal ideas, sleep disturbance and dysphoric mood. The patient is not nervous/anxious.     Objective:  BP 148/79 mmHg  Pulse 80  Temp(Src) 98.9 F (37.2 C)  Resp 16  Ht _0  (1.803 m)  Wt 184 lb (83.462 kg)  BMI 25.67 kg/m2  SpO2 97%  BP/Weight 01/02/2016 09/12/2015 98/92/1194  Systolic BP 174 081 448  Diastolic BP 79 82 83  Wt. (Lbs) 184 181 178  BMI 25.67 25.26 24.84     Physical Exam  Constitutional: He appears well-developed and well-nourished. No  distress.  HENT:  Head: Normocephalic and atraumatic.  Neck: Normal range of motion. Neck supple.  Cardiovascular: Normal rate, regular rhythm, normal heart sounds and intact distal pulses.   Pulmonary/Chest: Effort normal and breath sounds normal.  Musculoskeletal: He exhibits no edema.  Neurological: He is alert.  Skin: Skin is warm and dry. No rash noted. No erythema.     Psychiatric: He has a normal mood and affect.    Lab Results  Component Value Date   HGBA1C 8.3 01/02/2016   A1c 7.9  CBG 180  Assessment &  Plan:   [name] [name] was seen today for diabetes.  Diagnoses and all orders for this visit:  Uncontrolled type 2 diabetes mellitus without complication, without long-term current use of insulin (HCC) -     POCT glycosylated hemoglobin (Hb A1C) -     POCT glucose (manual entry) -     glipiZIDE (GLUCOTROL) 10 MG tablet; Take 1 tablet (10 mg total) by mouth 2 (two) times daily before a meal. -     COMPLETE METABOLIC PANEL WITH GFR -     Lipid Panel  Erectile dysfunction, unspecified erectile dysfunction type -     tadalafil (CIALIS) 10 MG tablet; Take 1 tablet (10 mg total) by mouth daily as needed for erectile dysfunction. For PASS -     Ambulatory referral to Urology -     Testosterone  Skin lesion -     Ambulatory referral to Dermatology    No orders of the defined types were placed in this encounter.    Follow-up: No Follow-up on file.   Boykin Nearing MD

## 2016-01-02 NOTE — Assessment & Plan Note (Signed)
Declined with rise in A1c, he has been intolerant of metformin and farxiga in the past  Plan  Add glipizide Increase exercise Reduce carbs

## 2016-01-02 NOTE — Assessment & Plan Note (Signed)
Elevated BP in smoker with diabetes  Plan weight loss Smoking cessation

## 2016-01-02 NOTE — Progress Notes (Signed)
F/U DM  Medicine refills  Stated not checking glucose as directed  Taking medication daily  No pain today  No tobacco user  No suicidal thoughts in the past two weeks

## 2016-01-02 NOTE — Assessment & Plan Note (Signed)
Skin lesion on posterior neck concerned for basal cell carcinoma Derm referral for biopsy

## 2016-01-02 NOTE — Assessment & Plan Note (Signed)
ED in diabetes and elevated BP  Plan: Treated diabetes Reduce BP with weight loss, exercise, smoking cessation

## 2016-01-03 LAB — TESTOSTERONE: Testosterone: 182 ng/dL — ABNORMAL LOW (ref 250–827)

## 2016-01-08 DIAGNOSIS — R7989 Other specified abnormal findings of blood chemistry: Secondary | ICD-10-CM | POA: Insufficient documentation

## 2016-01-08 MED ORDER — ATORVASTATIN CALCIUM 40 MG PO TABS: 40.0000 mg | ORAL_TABLET | Freq: Every day | ORAL | Status: DC

## 2016-01-08 MED FILL — ATORVASTATIN 40 MG TABLET: 40 | 90 days supply | Qty: 90 | Fill #0

## 2016-01-08 NOTE — Assessment & Plan Note (Signed)
Low testosterone, screening PSA, hematocrit check needed prior to starting therapy. But testosterone injections every 2 weeks for 12 weeks is an option. Call or send a mychart message if you are interested in testosterone replacement. I have ordered the needed pre-treatment labs.

## 2016-01-08 NOTE — Addendum Note (Signed)
Addended by: Boykin Nearing on: 01/08/2016 10:20 AM   Modules accepted: Orders

## 2016-01-09 ENCOUNTER — Other Ambulatory Visit: Payer: No Typology Code available for payment source | Admitting: Licensed Clinical Social Worker

## 2016-01-16 ENCOUNTER — Ambulatory Visit: Payer: Self-pay | Admitting: Internal Medicine

## 2016-01-23 ENCOUNTER — Encounter: Payer: Self-pay | Admitting: Gastroenterology

## 2016-01-23 ENCOUNTER — Ambulatory Visit (INDEPENDENT_AMBULATORY_CARE_PROVIDER_SITE_OTHER): Payer: Self-pay | Admitting: Gastroenterology

## 2016-01-23 ENCOUNTER — Other Ambulatory Visit (INDEPENDENT_AMBULATORY_CARE_PROVIDER_SITE_OTHER): Payer: Self-pay

## 2016-01-23 ENCOUNTER — Other Ambulatory Visit: Payer: Self-pay | Admitting: Internal Medicine

## 2016-01-23 VITALS — BP 112/80 | HR 82 | Ht 71.0 in | Wt 186.0 lb

## 2016-01-23 DIAGNOSIS — R932 Abnormal findings on diagnostic imaging of liver and biliary tract: Secondary | ICD-10-CM

## 2016-01-23 DIAGNOSIS — Z8619 Personal history of other infectious and parasitic diseases: Secondary | ICD-10-CM

## 2016-01-23 DIAGNOSIS — K2270 Barrett's esophagus without dysplasia: Secondary | ICD-10-CM

## 2016-01-23 DIAGNOSIS — Z7289 Other problems related to lifestyle: Secondary | ICD-10-CM

## 2016-01-23 DIAGNOSIS — Z789 Other specified health status: Secondary | ICD-10-CM

## 2016-01-23 DIAGNOSIS — K219 Gastro-esophageal reflux disease without esophagitis: Secondary | ICD-10-CM

## 2016-01-23 LAB — PROTIME-INR
INR: 1.1 ratio — AB (ref 0.8–1.0)
PROTHROMBIN TIME: 11.4 s (ref 9.6–13.1)

## 2016-01-23 LAB — CBC WITH DIFFERENTIAL/PLATELET
BASOS ABS: 0.1 10*3/uL (ref 0.0–0.1)
Basophils Relative: 1 % (ref 0.0–3.0)
EOS ABS: 0.4 10*3/uL (ref 0.0–0.7)
EOS PCT: 3.2 % (ref 0.0–5.0)
HCT: 44.9 % (ref 39.0–52.0)
HEMOGLOBIN: 15.4 g/dL (ref 13.0–17.0)
LYMPHS ABS: 3.7 10*3/uL (ref 0.7–4.0)
Lymphocytes Relative: 30 % (ref 12.0–46.0)
MCHC: 34.2 g/dL (ref 30.0–36.0)
MCV: 93.4 fl (ref 78.0–100.0)
MONO ABS: 1.4 10*3/uL — AB (ref 0.1–1.0)
Monocytes Relative: 11.5 % (ref 3.0–12.0)
NEUTROS PCT: 54.3 % (ref 43.0–77.0)
Neutro Abs: 6.7 10*3/uL (ref 1.4–7.7)
Platelets: 235 10*3/uL (ref 150.0–400.0)
RBC: 4.81 Mil/uL (ref 4.22–5.81)
RDW: 13 % (ref 11.5–15.5)
WBC: 12.3 10*3/uL — AB (ref 4.0–10.5)

## 2016-01-23 MED ORDER — OMEPRAZOLE 20 MG PO CPDR: 20.0000 mg | DELAYED_RELEASE_CAPSULE | Freq: Every day | ORAL | Status: DC

## 2016-01-23 NOTE — Progress Notes (Signed)
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   He reports a history of Barrett's diagnosed last year - short segment of 2cm, no dysplasia. He denies any heartburn routinely during the day, but he has some at nighttime reflux which can bother him. He has some regurgitation at night which . He denies any dysphagia, with some occasional sore throat. No weight loss. Eating well in general. He is not taking any PPIs or antacids.   He has history of possible cirrhosis of the liver based on elastography of F4 done about a year ago. He denies prior liver biopsy. He has a history of hepatitis C, treated with negative viral load following course of Harvoni. No history of decompensations noted. US liver 11/2014 - with elastography with F4 high risk of fibrosis. He drinks beer routinely, 2-3 per night, he says he is trying to cut back. No FH of liver disease.   No FH of colon cancer  Endoscopic history: Colonoscopy 02/13/14 - 2cm tubulovillous adenoma removed in the cecum, complicated by post-polypectomy bleed EGD 11/19/14 - 2cm segment nondyplastic BE, LA class B esophagitis, no varices, 3cm hiatal hernia  Past Medical History  Diagnosis Date  . Depression   . Diabetes ([place])   . Hepatitis C   . Bronchitis   . Neuropathy ([place])   . ED (erectile dysfunction)   . GERD (gastroesophageal reflux disease)   . Barrett's esophagus dx 2016     Past Surgical History  Procedure Laterality Date  . Appendectomy    . Tonsillectomy    . Colonoscopy N/A 02/16/2014    Procedure: COLONOSCOPY;  Surgeon: Gatha Mayer, MD;  Location: [facility];  Service: Endoscopy;  Laterality: N/A;  . Colonoscopy     Family History  Problem Relation Age of Onset  . Cancer Mother   . Diabetes Mother   . Colon cancer Neg Hx   . Heart disease Maternal Grandfather   . Pancreatic cancer Paternal Grandmother     Social History  Substance Use Topics  . Smoking status: Current Every Day Smoker -- 0.50 packs/day for 35 years    Last Attempt to Quit: 03/26/2015  . Smokeless tobacco: Never Used  . Alcohol Use: 12.6 oz/week    21 Standard drinks or equivalent per week     Comment: 21 beers a week   Current Outpatient Prescriptions  Medication Sig Dispense Refill  . glucose blood test strip Use as instructed 100 each 12  . glucose monitoring kit (FREESTYLE) monitoring kit 1 each by Does not apply route as needed for other. Used as directed 1 each 0  . Lancets (FREESTYLE) lancets Use as instructed 100 each 12  . pregabalin (LYRICA) 100 MG capsule Take 1 capsule (100 mg total) by mouth 3 (three) times daily. 270 capsule 3  . VENTOLIN HFA 108 (90 Base) MCG/ACT inhaler INHALE 2 PUFFS INTO THE LUNGS EVERY 6 HOURS AS NEEDED FOR WHEEZING OR SHORTNESS OF BREATH 54 each 0  . zolpidem (AMBIEN) 10 MG tablet Take 10 mg by mouth at bedtime as needed for sleep. At 2000    . atorvastatin (LIPITOR) 40 MG tablet Take 1 tablet (40 mg total) by mouth daily. (Patient not taking: Reported on 01/23/2016) 90 tablet 3  . glipiZIDE (GLUCOTROL) 10 MG tablet Take 1 tablet (10 mg total) by mouth 2 (two) times daily before a  meal. (Patient not taking: Reported on 01/23/2016) 60 tablet 3  . tadalafil (CIALIS) 10 MG tablet Take 1 tablet (10 mg total) by mouth daily as needed for erectile dysfunction. For PASS (Patient not taking: Reported on 01/23/2016) 30 tablet 3  . [DISCONTINUED] ipratropium (ATROVENT) 0.06 % nasal spray Place 2 sprays into both nostrils 4 (four) times daily. (Patient not taking: Reported on 04/02/2015) 15 mL 1  . [DISCONTINUED] omeprazole-sodium bicarbonate (ZEGERID) 40-1100 MG per capsule Take 1 capsule by mouth at bedtime. (Patient not taking: Reported on 04/02/2015) 30 capsule 3   No current facility-administered medications for this visit.   No Known Allergies   Review of Systems: All systems reviewed and  negative except where noted in HPI.   Lab Results  Component Value Date   ALT 38 01/02/2016   AST 26 01/02/2016   ALKPHOS 61 01/02/2016   BILITOT 0.4 01/02/2016    Lab Results  Component Value Date   WBC 11.4* 03/19/2015   HGB 16.0 03/19/2015   HCT 46.2 03/19/2015   MCV 93.5 03/19/2015   PLT 233 03/19/2015    Lab Results  Component Value Date   INR 1.05 10/16/2014     Physical Exam: BP 112/80 mmHg  Pulse 82  Ht '5\' 11"'  (1.803 m)  Wt 186 lb (84.369 kg)  BMI 25.95 kg/m2 Constitutional: Pleasant,well-developed, male in no acute distress. HEENT: Normocephalic and atraumatic. Conjunctivae are normal. No scleral icterus. Neck supple.  Cardiovascular: Normal rate, regular rhythm.  Pulmonary/chest: Effort normal and breath sounds normal. No wheezing, rales or rhonchi. Abdominal: Soft, nondistended, nontender. Bowel sounds active throughout. There are no masses palpable. No hepatomegaly. Extremities: no edema Lymphadenopathy: No cervical adenopathy noted. Neurological: Alert and oriented to person place and time. Skin: Skin is warm and dry. No rashes noted. Psychiatric: Normal mood and affect. Behavior is normal.   ASSESSMENT AND PLAN: 55 y/o male with medical history as outlined above, here for the following issues:  Barrett's / GERD - short segment BE diagnosed in 2016 without dysplasia. I counseled him on Barrett's and risk for esophageal cancer. He needs to quit tobacco and abstain from alcohol. He continues to have some nocturnal reflux symptoms, not taking anything for this. Recommend daily PPI given his history of Barrett's and ongoing reflux, will try omeprazole 1m daily and see how he does, we can titrate to control symptoms. Otherwise due for recall EGD in 2019 for surveillance. He agreed.   H/o hepatitis C / possible cirrhosis - his hep C is eradicated. Vaccines for hep A/B done last year.  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 I am not convinced he has cirrhosis of the liver at this time, but it's possible. I would recommend a repeat interval [place] in case he does have cirrhosis and check AFP level, and also re-evaluate for changes of cirrhosis. He needs to completely abstain from alcohol given his liver disease history and discussed risks of cirrhosis, hepatic decompensation, etc. He will continue to work on this, I offered him referral for behavioral health which he declined. Will otherwise check coags and CBC to ensure stable. LFTs normal recently.   History of colon adenoma - due for recall colonoscopy 01/2017 for surveillance, he agreed.   SCarolina Cellar MD LDignity Health -St. [facility]aming[facility] CampusGastroenterology Pager [phone]

## 2016-01-23 NOTE — Patient Instructions (Signed)
  Your physician has requested that you go to the basement for the lab work before leaving today.   Stop Ibuprofen, may take low dose tylenol (325mg  type).   You have been scheduled for an abdominal ultrasound at [facility] (1st floor of hospital) on 02/03/16 at 7:00AM. Please arrive 15 minutes prior to your appointment for registration. Make certain not to have anything to eat or drink 6 hours prior to your appointment. Should you need to reschedule your appointment, please contact radiology at [phone]. This test typically takes about 30 minutes to perform.   We have sent the following medications to your pharmacy for you to pick up at your convenience: Omeprazole  We are putting you in for a EGD/Colon recalls.    I appreciate the opportunity to care for you.

## 2016-01-24 LAB — AFP TUMOR MARKER: AFP-Tumor Marker: 6.5 ng/mL — ABNORMAL HIGH (ref <6.1)

## 2016-01-30 ENCOUNTER — Encounter: Payer: Self-pay | Admitting: *Deleted

## 2016-01-30 ENCOUNTER — Ambulatory Visit (INDEPENDENT_AMBULATORY_CARE_PROVIDER_SITE_OTHER): Payer: Self-pay | Admitting: Licensed Clinical Social Worker

## 2016-01-30 DIAGNOSIS — F32A Depression, unspecified: Secondary | ICD-10-CM

## 2016-01-30 DIAGNOSIS — F1010 Alcohol abuse, uncomplicated: Secondary | ICD-10-CM

## 2016-01-30 DIAGNOSIS — F329 Major depressive disorder, single episode, unspecified: Secondary | ICD-10-CM

## 2016-01-30 NOTE — Progress Notes (Signed)
   THERAPY PROGRESS NOTE  Session Time: 22min  Participation Level: Active  Behavioral Response: Casual and Fairly GroomedAlertDepressed  Type of Therapy: Individual Therapy  Treatment Goals addressed: Coping  Interventions: Supportive  Summary: [name] [name] is a 55 y.o. male who presents with a depressed mood and appropriate affect. He reported that things have not been going well for him. He shared that he had his hearing to get his driver's license back and was denied; this means he has to wait another 12 months to apply for another hearing. He expressed his disappointment and hopelessness that his life will get better. He attributed his problems with work and romance to his lack of license. He reported that he has increased his alcohol intake because he is feeling more depressed; he is now drinking 2-3 large beers every night and 6 or more on weekend nights. He reported that he is not drinking and driving. [name] [name] shared about his current health problems, the most pressing of which is that he continues to vomit often. He reported that he has erectile dysfunction, which interferes with his ability to date. He stated that he does not know what he wants to work on in counseling because he cannot think of anything that would make his life better.   Suicidal/Homicidal: Nowithout intent/plan  Therapist Response: LCSW utilized supportive counseling techniques throughout the session in order to validate emotions and encourage open expression of emotion. LCSW and [name] [name] discussed goals for counseling sessions but [name] [name] was unable to identify an issue that he would like to focus on. LCSW noted that [name] [name] jumped from subject to subject during the session and had difficulty focusing.  Plan: Return again in 3 weeks.  Diagnosis: Axis I: See current hospital problem list    Axis II: No diagnosis    [name] Clines, LCSW 01/30/2016

## 2016-01-31 ENCOUNTER — Other Ambulatory Visit: Payer: Self-pay | Admitting: *Deleted

## 2016-01-31 DIAGNOSIS — N529 Male erectile dysfunction, unspecified: Secondary | ICD-10-CM

## 2016-01-31 MED ORDER — TADALAFIL 10 MG PO TABS: 10.0000 mg | ORAL_TABLET | Freq: Every day | ORAL | Status: DC | PRN

## 2016-02-03 ENCOUNTER — Ambulatory Visit (HOSPITAL_COMMUNITY): Payer: Self-pay

## 2016-02-05 ENCOUNTER — Telehealth: Payer: Self-pay | Admitting: Gastroenterology

## 2016-02-05 NOTE — Telephone Encounter (Signed)
Spoke with patient and gave him Dr. Doyne Keel recommendation about increased WBC.(notifiy PCP)

## 2016-02-18 ENCOUNTER — Ambulatory Visit: Payer: Self-pay | Attending: Family Medicine | Admitting: Family Medicine

## 2016-02-18 ENCOUNTER — Encounter: Payer: Self-pay | Admitting: Family Medicine

## 2016-02-18 VITALS — BP 128/69 | HR 90 | Temp 99.0°F | Resp 16 | Ht 71.0 in | Wt 183.0 lb

## 2016-02-18 DIAGNOSIS — IMO0001 Reserved for inherently not codable concepts without codable children: Secondary | ICD-10-CM

## 2016-02-18 DIAGNOSIS — Z79899 Other long term (current) drug therapy: Secondary | ICD-10-CM | POA: Insufficient documentation

## 2016-02-18 DIAGNOSIS — F1099 Alcohol use, unspecified with unspecified alcohol-induced disorder: Secondary | ICD-10-CM | POA: Insufficient documentation

## 2016-02-18 DIAGNOSIS — F17200 Nicotine dependence, unspecified, uncomplicated: Secondary | ICD-10-CM | POA: Insufficient documentation

## 2016-02-18 DIAGNOSIS — E1165 Type 2 diabetes mellitus with hyperglycemia: Secondary | ICD-10-CM

## 2016-02-18 DIAGNOSIS — E118 Type 2 diabetes mellitus with unspecified complications: Secondary | ICD-10-CM | POA: Insufficient documentation

## 2016-02-18 DIAGNOSIS — E291 Testicular hypofunction: Secondary | ICD-10-CM

## 2016-02-18 DIAGNOSIS — N529 Male erectile dysfunction, unspecified: Secondary | ICD-10-CM | POA: Insufficient documentation

## 2016-02-18 DIAGNOSIS — R7989 Other specified abnormal findings of blood chemistry: Secondary | ICD-10-CM

## 2016-02-18 DIAGNOSIS — D72829 Elevated white blood cell count, unspecified: Secondary | ICD-10-CM | POA: Insufficient documentation

## 2016-02-18 DIAGNOSIS — K7460 Unspecified cirrhosis of liver: Secondary | ICD-10-CM | POA: Insufficient documentation

## 2016-02-18 LAB — GLUCOSE, POCT (MANUAL RESULT ENTRY): POC Glucose: 285 mg/dl — AB (ref 70–99)

## 2016-02-18 LAB — CBC WITH DIFFERENTIAL/PLATELET
Basophils Absolute: 84 cells/uL (ref 0–200)
Basophils Relative: 1 %
EOS PCT: 2 %
Eosinophils Absolute: 168 cells/uL (ref 15–500)
HEMATOCRIT: 44.7 % (ref 38.5–50.0)
HEMOGLOBIN: 15.2 g/dL (ref 13.2–17.1)
LYMPHS PCT: 28 %
Lymphs Abs: 2352 cells/uL (ref 850–3900)
MCH: 32.3 pg (ref 27.0–33.0)
MCHC: 34 g/dL (ref 32.0–36.0)
MCV: 95.1 fL (ref 80.0–100.0)
MONO ABS: 924 {cells}/uL (ref 200–950)
MPV: 10.9 fL (ref 7.5–12.5)
Monocytes Relative: 11 %
NEUTROS PCT: 58 %
Neutro Abs: 4872 cells/uL (ref 1500–7800)
Platelets: 192 10*3/uL (ref 140–400)
RBC: 4.7 MIL/uL (ref 4.20–5.80)
RDW: 12.5 % (ref 11.0–15.0)
WBC: 8.4 10*3/uL (ref 3.8–10.8)

## 2016-02-18 LAB — POCT URINALYSIS DIPSTICK
Blood, UA: NEGATIVE
GLUCOSE UA: 500
Leukocytes, UA: NEGATIVE
NITRITE UA: NEGATIVE
Spec Grav, UA: >=1.03
UROBILINOGEN UA: 0.2
pH, UA: 5

## 2016-02-18 MED ORDER — TRUE METRIX METER W/DEVICE KIT: 1.0000 | PACK | Status: DC | PRN

## 2016-02-18 MED ORDER — GLIPIZIDE 5 MG PO TABS: 5.0000 mg | ORAL_TABLET | Freq: Two times a day (BID) | ORAL | Status: DC

## 2016-02-18 MED ORDER — TRUEPLUS LANCETS 28G MISC: 1.0000 | Freq: Three times a day (TID) | Status: DC

## 2016-02-18 MED ORDER — GLUCOSE BLOOD VI STRP: 1.0000 | ORAL_STRIP | Freq: Three times a day (TID) | Status: DC

## 2016-02-18 MED FILL — !VENTOLIN HFA INHALER: 108 (90 BAS | 30 days supply | Qty: 18 | Fill #1

## 2016-02-18 MED FILL — !TRUE METRIX BLOOD GLUCOSE: 1 days supply | Qty: 1 | Fill #0

## 2016-02-18 MED FILL — TRUE METRIX TEST STRIP: 33 days supply | Qty: 100 | Fill #0

## 2016-02-18 MED FILL — ?OMEPRAZOLE DR 20 MG CAPSUL: 20 | 30 days supply | Qty: 30 | Fill #0

## 2016-02-18 NOTE — Progress Notes (Signed)
Subjective:  Patient ID: [name] [name], male    DOB: [birth year]  Age: 55 y.o. 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 Elevated BP: he started exercising 5 days ago. No HA, CP or SOB. He continues to smoke.   2. DM2: he is not taking glipizide due to subjective low sugar. He is not compliant with low carb diet. He is exercising. No polyuria or polydipsia.  3. Elevated WBC: slightly elevation of WBC for past year. No fever, chills or  Significant weight loss.   4. ED and low testosterone: labs confirmed low testosterone. He has cirrhosis. He continues to drink alcohol. He complains of erectile dysfunction. He has not tried the Cialis prescribed at last OV due to cost. He is uninsured.   Social History  Substance Use Topics  . Smoking status: Current Every Day Smoker -- 0.50 packs/day for 35 years    Last Attempt to Quit: 03/26/2015  . Smokeless tobacco: Never Used  . Alcohol Use: 12.6 oz/week    21 Standard drinks or equivalent per week     Comment: 21 beers a week   Outpatient Prescriptions Prior to Visit  Medication Sig Dispense Refill  . glucose monitoring kit (FREESTYLE) monitoring kit 1 each by Does not apply route as needed for other. Used as directed 1 each 0  . Lancets (FREESTYLE) lancets Use as instructed 100 each 12  . pregabalin (LYRICA) 100 MG capsule Take 1 capsule (100 mg total) by mouth 3 (three) times daily. 270 capsule 3  . VENTOLIN HFA 108 (90 Base) MCG/ACT inhaler INHALE 2 PUFFS INTO THE LUNGS EVERY 6 HOURS AS NEEDED FOR WHEEZING OR SHORTNESS OF BREATH 54 each 0  . zolpidem (AMBIEN) 10 MG tablet Take 10 mg by mouth at bedtime as needed for sleep. At 2000    . atorvastatin (LIPITOR) 40 MG tablet Take 1 tablet (40 mg total) by mouth daily. (Patient not taking: Reported on 01/23/2016) 90 tablet 3  . glipiZIDE (GLUCOTROL)  10 MG tablet Take 1 tablet (10 mg total) by mouth 2 (two) times daily before a meal. (Patient not taking: Reported on 01/23/2016) 60 tablet 3  . glucose blood test strip Use as instructed (Patient not taking: Reported on 02/18/2016) 100 each 12  . omeprazole (PRILOSEC) 20 MG capsule Take 1 capsule (20 mg total) by mouth daily. (Patient not taking: Reported on 02/18/2016) 90 capsule 3  . tadalafil (CIALIS) 10 MG tablet Take 1 tablet (10 mg total) by mouth daily as needed for erectile dysfunction. For PASS (Patient not taking: Reported on 02/18/2016) 30 tablet 3   No facility-administered medications prior to visit.    ROS Review of Systems  Constitutional: Negative for fever, chills, fatigue and unexpected weight change.  Eyes: Negative for visual disturbance.  Respiratory: Negative for cough and shortness of breath.   Cardiovascular: Negative for chest pain, palpitations and leg swelling.  Gastrointestinal: Negative for nausea, vomiting, abdominal pain, diarrhea, constipation and blood in stool.  Endocrine: Negative for polydipsia, polyphagia and polyuria.  Genitourinary:       Trouble with erection   Musculoskeletal: Negative for myalgias, back pain, arthralgias, gait problem and neck pain.  Skin: Negative for rash.  Allergic/Immunologic: Negative for immunocompromised state.  Hematological: Negative for adenopathy. Does not bruise/bleed easily.  Psychiatric/Behavioral: Negative for suicidal ideas, sleep disturbance and dysphoric  mood. The patient is not nervous/anxious.     Objective:  BP 128/69 mmHg  Pulse 90  Temp(Src) 99 F (37.2 C)  Resp 16  Ht _0  (1.803 m)  Wt 183 lb (83.008 kg)  BMI 25.53 kg/m2  SpO2 97%   BP/Weight 02/18/2016 03/23/1953 10/04/8142  Systolic BP 392 659 978  Diastolic BP 69 80 79  Wt. (Lbs) 183 186 184  BMI 25.53 25.95 25.67    Physical Exam  Constitutional: He appears well-developed and well-nourished. No distress.  HENT:  Head: Normocephalic and  atraumatic.  Neck: Normal range of motion. Neck supple.  Cardiovascular: Normal rate, regular rhythm, normal heart sounds and intact distal pulses.   Pulmonary/Chest: Effort normal and breath sounds normal.  Musculoskeletal: He exhibits no edema.  Neurological: He is alert.  Skin: Skin is warm and dry. No rash noted. No erythema.  Psychiatric: He has a normal mood and affect.   Lab Results  Component Value Date   HGBA1C 8.3 01/02/2016   CBG 285 Assessment & Plan:   Xzavian was seen today for hypertension.  Diagnoses and all orders for this visit:  Uncontrolled type 2 diabetes mellitus without complication, without long-term current use of insulin (HCC) -     POCT glucose (manual entry) -     POCT urinalysis dipstick -     glipiZIDE (GLUCOTROL) 5 MG tablet; Take 1 tablet (5 mg total) by mouth 2 (two) times daily before a meal. -     glucose blood (TRUE METRIX BLOOD GLUCOSE TEST) test strip; 1 each by Other route 3 (three) times daily. -     Blood Glucose Monitoring Suppl (TRUE METRIX METER) w/Device KIT; 1 each by Does not apply route as needed. -     TRUEPLUS LANCETS 28G MISC; 1 each by Does not apply route 3 (three) times daily.  Low serum testosterone level -     Testosterone -     PSA  Elevated WBC count -     CBC with Differential    No orders of the defined types were placed in this encounter.    Follow-up: No Follow-up on file.   Boykin Nearing MD

## 2016-02-18 NOTE — Assessment & Plan Note (Signed)
A: ED and low T P: Repeat T CBC for HCT PSA The recommendation is 3 consecutive low AM testosterone levels, preferably fasting to confirm low testosterone  Testosterone  injections every 2 weeks for 12 weeks is an option.

## 2016-02-18 NOTE — Assessment & Plan Note (Signed)
A: uncontrolled diabetes with hyperglycemia due to medication non compliance P: Continue glipizide, decreased dose from 10 to 5 mg BID with food  Continue exercise Emphasized low sugar diet

## 2016-02-18 NOTE — Progress Notes (Signed)
F/U HTN  Testosterone level  Tobacco user 1 ppday  No pain today  No suicidal thoughts in the past two weeks

## 2016-02-18 NOTE — Patient Instructions (Addendum)
[name] [name] was seen today for hypertension.  Diagnoses and all orders for this visit:  Uncontrolled type 2 diabetes mellitus without complication, without long-term current use of insulin (HCC) -     POCT glucose (manual entry) -     POCT urinalysis dipstick -     glipiZIDE (GLUCOTROL) 5 MG tablet; Take 1 tablet (5 mg total) by mouth 2 (two) times daily before a meal. -     glucose blood (TRUE METRIX BLOOD GLUCOSE TEST) test strip; 1 each by Other route 3 (three) times daily. -     Blood Glucose Monitoring Suppl (TRUE METRIX METER) w/Device KIT; 1 each by Does not apply route as needed. -     TRUEPLUS LANCETS 28G MISC; 1 each by Does not apply route 3 (three) times daily.  Low serum testosterone level -     Testosterone -     PSA  Elevated WBC count -     CBC with Differential   Diabetes blood sugar goals  Fasting (in AM before breakfast, 8 hrs of no eating or drinking (except water or unsweetened coffee or tea): 90-110 2 hrs after meals: < 160,   No low sugars: nothing < 70   F/u in 4 weeks for low testerone   Dr. Adrian Blackwater

## 2016-02-18 NOTE — Assessment & Plan Note (Signed)
Slightly elevated WBC Repeat CBC with diff

## 2016-02-18 NOTE — Addendum Note (Signed)
Addended by: Boykin Nearing on: 02/18/2016 03:48 PM   Modules accepted: Orders

## 2016-02-19 LAB — TESTOSTERONE: TESTOSTERONE: 239 ng/dL — AB (ref 250–827)

## 2016-02-19 LAB — PSA: PSA: 0.32 ng/mL (ref <=4.00)

## 2016-02-20 ENCOUNTER — Other Ambulatory Visit: Payer: Self-pay | Admitting: Family Medicine

## 2016-02-20 ENCOUNTER — Ambulatory Visit (INDEPENDENT_AMBULATORY_CARE_PROVIDER_SITE_OTHER): Payer: Self-pay | Admitting: Licensed Clinical Social Worker

## 2016-02-20 DIAGNOSIS — R7989 Other specified abnormal findings of blood chemistry: Secondary | ICD-10-CM

## 2016-02-20 DIAGNOSIS — F1010 Alcohol abuse, uncomplicated: Secondary | ICD-10-CM

## 2016-02-20 NOTE — Progress Notes (Signed)
   THERAPY PROGRESS NOTE  Session Time: 53min  Participation Level: Active  Behavioral Response: CasualAlertEuthymic  Type of Therapy: Individual Therapy  Treatment Goals addressed: Coping  Interventions: Supportive  Summary: [name] [name] is a 55 y.o. male who presents with a euthymic mood and appropriate affect. He reported that he has still been feeling down and depressed but that he is taking concrete steps with self-care, including a new exercise routine at the gym. [name] shared that working out has given him a bit more energy and is starting to positively impact his mood as well. He shared that he is still planning on moving in with his mother, probably by August 1. He reported that he would like to meet more people to improve his social life, both dating and friendship, but that it is difficult for him to meet new people. He reported that he is meeting with his nutritionist regularly to work on improving his eating habits. He shared that he is still drinking at night.  Suicidal/Homicidal: Nowithout intent/plan  Therapist Response: LCSW utilized supportive counseling techniques throughout the session in order to validate emotions and encourage open expression of emotion. LCSW and [name] discussed ways to improve his daily schedule and stay more in a routine. LCSW provided affirmations for his steps in working on his eating habits and exercise.  Plan: Return again in 3 weeks.  Diagnosis: Axis I: See current hospital problem list    Axis II: No diagnosis    Metta Clines, LCSW 02/20/2016

## 2016-03-12 ENCOUNTER — Ambulatory Visit (INDEPENDENT_AMBULATORY_CARE_PROVIDER_SITE_OTHER): Payer: Self-pay | Admitting: Licensed Clinical Social Worker

## 2016-03-12 DIAGNOSIS — F1010 Alcohol abuse, uncomplicated: Secondary | ICD-10-CM

## 2016-03-12 NOTE — Progress Notes (Signed)
   THERAPY PROGRESS NOTE  Session Time: 39min  Participation Level: Active  Behavioral Response: Casual and Well GroomedAlertEuthymic  Type of Therapy: Individual Therapy  Treatment Goals addressed: Coping  Interventions: Supportive  Summary: [name] [name] is a 55 y.o. male who presents with a euthymic mood and appropriate affect. He reported that he has been doing okay but not great. He shared about a recent trip to the beach with his sister and friends. He reported that he has been working out at the gym 5 times per week but has not noticed much of a difference in his mood. He shared that he continues to drink at home every night. He shared about his loneliness and his belief that he would be much better off if he had a girlfriend. He appeared receptive to [employer] feedback about working on himself as a way to solve problems, in that a relationship would not necessarily make anything better. [name] was able to identify a few positive things about himself, such as generosity and a helping spirit. He shared that he is working on moving out of his apartment and into his mother's house, which has a lot of pros and cons. He expressed hopefulness that he will have more flexibility to go out of town and work.    Suicidal/Homicidal: Nowithout intent/plan  Therapist Response: LCSW utilized supportive counseling techniques throughout the session in order to validate emotions and encourage open expression of emotion. LCSW and [name] processed about his current stressors, which include low work hours, not having a girlfriend, and needing to move.  Plan: Return again in 3 weeks.  Diagnosis: Axis I: See current hospital problem list    Axis II: No diagnosis    Metta Clines, LCSW 03/12/2016

## 2016-03-27 MED FILL — ?OMEPRAZOLE DR 20 MG CAPSUL: 20 | 30 days supply | Qty: 30 | Fill #1

## 2016-03-27 MED FILL — !VENTOLIN HFA INHALER: 108 (90 BAS | 30 days supply | Qty: 18 | Fill #2

## 2016-04-02 ENCOUNTER — Other Ambulatory Visit: Payer: No Typology Code available for payment source | Admitting: Licensed Clinical Social Worker

## 2016-04-23 ENCOUNTER — Ambulatory Visit (INDEPENDENT_AMBULATORY_CARE_PROVIDER_SITE_OTHER): Payer: Self-pay | Admitting: Licensed Clinical Social Worker

## 2016-04-23 ENCOUNTER — Encounter: Payer: Self-pay | Admitting: Family Medicine

## 2016-04-23 ENCOUNTER — Ambulatory Visit: Payer: Self-pay | Attending: Family Medicine | Admitting: Family Medicine

## 2016-04-23 VITALS — BP 135/82 | HR 68 | Temp 97.9°F | Wt 188.2 lb

## 2016-04-23 DIAGNOSIS — N529 Male erectile dysfunction, unspecified: Secondary | ICD-10-CM | POA: Insufficient documentation

## 2016-04-23 DIAGNOSIS — Z79899 Other long term (current) drug therapy: Secondary | ICD-10-CM | POA: Insufficient documentation

## 2016-04-23 DIAGNOSIS — Z0000 Encounter for general adult medical examination without abnormal findings: Secondary | ICD-10-CM

## 2016-04-23 DIAGNOSIS — E1165 Type 2 diabetes mellitus with hyperglycemia: Secondary | ICD-10-CM

## 2016-04-23 DIAGNOSIS — F17210 Nicotine dependence, cigarettes, uncomplicated: Secondary | ICD-10-CM | POA: Insufficient documentation

## 2016-04-23 DIAGNOSIS — IMO0001 Reserved for inherently not codable concepts without codable children: Secondary | ICD-10-CM

## 2016-04-23 DIAGNOSIS — B1920 Unspecified viral hepatitis C without hepatic coma: Secondary | ICD-10-CM | POA: Insufficient documentation

## 2016-04-23 DIAGNOSIS — E119 Type 2 diabetes mellitus without complications: Secondary | ICD-10-CM | POA: Insufficient documentation

## 2016-04-23 DIAGNOSIS — Z7289 Other problems related to lifestyle: Secondary | ICD-10-CM | POA: Insufficient documentation

## 2016-04-23 DIAGNOSIS — F1010 Alcohol abuse, uncomplicated: Secondary | ICD-10-CM

## 2016-04-23 DIAGNOSIS — K7460 Unspecified cirrhosis of liver: Secondary | ICD-10-CM | POA: Insufficient documentation

## 2016-04-23 LAB — GLUCOSE, POCT (MANUAL RESULT ENTRY): POC Glucose: 154 mg/dl — AB (ref 70–99)

## 2016-04-23 LAB — POCT GLYCOSYLATED HEMOGLOBIN (HGB A1C): HEMOGLOBIN A1C: 7.5

## 2016-04-23 NOTE — Progress Notes (Signed)
Subjective:  Patient ID: [name] [name], male    DOB: 1961/03/31  Age: 55 y.o. 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 DM2: he is not taking glipizide due to subjective low sugar. He is not compliant with low carb diet. He is exercising. No polyuria or polydipsia.   2. ED and low testosterone: labs confirmed low testosterone. He has cirrhosis. He continues to drink alcohol. He complains of erectile dysfunction. He has not tried the Cialis prescribed at last OV due to cost. He is uninsured.   Social History  Substance Use Topics  . Smoking status: Current Every Day Smoker    Packs/day: 0.50    Years: 35.00    Last attempt to quit: 03/26/2015  . Smokeless tobacco: Never Used  . Alcohol use 12.6 oz/week    21 Standard drinks or equivalent per week     Comment: 21 beers a week   Outpatient Medications Prior to Visit  Medication Sig Dispense Refill  . atorvastatin (LIPITOR) 40 MG tablet Take 1 tablet (40 mg total) by mouth daily. (Patient not taking: Reported on 01/23/2016) 90 tablet 3  . Blood Glucose Monitoring Suppl (TRUE METRIX METER) w/Device KIT 1 each by Does not apply route as needed. 1 kit 0  . glipiZIDE (GLUCOTROL) 5 MG tablet Take 1 tablet (5 mg total) by mouth 2 (two) times daily before a meal. 60 tablet 2  . glucose blood (TRUE METRIX BLOOD GLUCOSE TEST) test strip 1 each by Other route 3 (three) times daily. 100 each 11  . omeprazole (PRILOSEC) 20 MG capsule Take 1 capsule (20 mg total) by mouth daily. (Patient not taking: Reported on 02/18/2016) 90 capsule 3  . pregabalin (LYRICA) 100 MG capsule Take 1 capsule (100 mg total) by mouth 3 (three) times daily. 270 capsule 3  . tadalafil (CIALIS) 10 MG tablet Take 1 tablet (10 mg total) by mouth daily as needed for erectile dysfunction. For PASS (Patient not taking: Reported on 02/18/2016)  30 tablet 3  . TRUEPLUS LANCETS 28G MISC 1 each by Does not apply route 3 (three) times daily. 100 each 11  . VENTOLIN HFA 108 (90 Base) MCG/ACT inhaler INHALE 2 PUFFS INTO THE LUNGS EVERY 6 HOURS AS NEEDED FOR WHEEZING OR SHORTNESS OF BREATH 54 each 0  . zolpidem (AMBIEN) 10 MG tablet Take 10 mg by mouth at bedtime as needed for sleep. At 2000     No facility-administered medications prior to visit.     ROS Review of Systems  Constitutional: Negative for chills, fatigue, fever and unexpected weight change.  Eyes: Negative for visual disturbance.  Respiratory: Negative for cough and shortness of breath.   Cardiovascular: Negative for chest pain, palpitations and leg swelling.  Gastrointestinal: Negative for abdominal pain, blood in stool, constipation, diarrhea, nausea and vomiting.  Endocrine: Negative for polydipsia, polyphagia and polyuria.  Genitourinary:       Trouble with erection   Musculoskeletal: Negative for arthralgias, back pain, gait problem, myalgias and neck pain.  Skin: Negative for rash.  Allergic/Immunologic: Negative for immunocompromised state.  Hematological: Negative for adenopathy. Does not bruise/bleed easily.  Psychiatric/Behavioral: Negative for dysphoric mood, sleep disturbance and suicidal ideas. The patient is not nervous/anxious.     Objective:  BP 135/82 (BP Location: Left Arm, Patient Position: Sitting, Cuff Size: Large)   Pulse 68  Temp 97.9 F (36.6 C) (Oral)   Wt 188 lb 3.2 oz (85.4 kg)   SpO2 96%   BMI 26.25 kg/m    BP/Weight 04/23/2016 02/18/2016 2/54/8628  Systolic BP 241 753 010  Diastolic BP 82 69 80  Wt. (Lbs) 188.2 183 186  BMI 26.25 25.53 25.95    Physical Exam  Constitutional: He appears well-developed and well-nourished. No distress.  HENT:  Head: Normocephalic and atraumatic.  Neck: Normal range of motion. Neck supple.  Cardiovascular: Normal rate, regular rhythm, normal heart sounds and intact distal pulses.     Pulmonary/Chest: Effort normal and breath sounds normal.  Musculoskeletal: He exhibits no edema.  Neurological: He is alert.  Skin: Skin is warm and dry. No rash noted. No erythema.  Psychiatric: He has a normal mood and affect.   Lab Results  Component Value Date   HGBA1C 8.3 01/02/2016   Lab Results  Component Value Date   HGBA1C 7.5 04/23/2016    CBG 285 Assessment & Plan:   [name] [name] was seen today for diabetes.  Diagnoses and all orders for this visit:  Healthcare maintenance -     Flu Vaccine QUAD 36+ mos IM  Uncontrolled type 2 diabetes mellitus without complication, without long-term current use of insulin (HCC) -     POCT glucose (manual entry) -     POCT glycosylated hemoglobin (Hb A1C) -     Microalbumin/Creatinine Ratio, Urine -     Ambulatory referral to Ophthalmology    No orders of the defined types were placed in this encounter.   Follow-up: Return in about 3 months (around 07/24/2016).   Boykin Nearing MD

## 2016-04-23 NOTE — Assessment & Plan Note (Signed)
Improved control Med: patient is not taking glipizde P: Continue low carb diet and regular exercise

## 2016-04-23 NOTE — Patient Instructions (Addendum)
[name] [name] was seen today for diabetes.  Diagnoses and all orders for this visit:  Healthcare maintenance -     Flu Vaccine QUAD 36+ mos IM  Uncontrolled type 2 diabetes mellitus without complication, without long-term current use of insulin (HCC) -     POCT glucose (manual entry) -     POCT glycosylated hemoglobin (Hb A1C) -     Microalbumin/Creatinine Ratio, Urine -     Ambulatory referral to Ophthalmology    Lab visit at earliest convenience, fasting  F/u in 3 months for diabetes   Dr. Adrian Blackwater

## 2016-04-23 NOTE — Progress Notes (Signed)
C/C: testosterone level check

## 2016-04-24 LAB — MICROALBUMIN / CREATININE URINE RATIO
CREATININE, URINE: 293 mg/dL (ref 20–370)
MICROALB UR: 0.8 mg/dL
Microalb Creat Ratio: 3 mcg/mg creat (ref <30)

## 2016-04-27 ENCOUNTER — Ambulatory Visit: Payer: Self-pay | Attending: Internal Medicine

## 2016-04-27 DIAGNOSIS — E291 Testicular hypofunction: Secondary | ICD-10-CM | POA: Insufficient documentation

## 2016-04-27 DIAGNOSIS — R7989 Other specified abnormal findings of blood chemistry: Secondary | ICD-10-CM

## 2016-04-27 NOTE — Patient Instructions (Signed)
Patient is aware of receiving a FU call regarding results. 

## 2016-04-27 NOTE — Progress Notes (Signed)
   THERAPY PROGRESS NOTE  Session Time: 71min  Participation Level: Active  Behavioral Response: CasualAlertDepressed  Type of Therapy: Individual Therapy  Treatment Goals addressed: Coping  Interventions: Strength-based and Supportive  Summary: [name] [name] is a 55 y.o. male who presents with a slightly depressed mood and appropriate affect. He completed a PHQ-9 depression measure and scored a 10, indicating moderate depression symptoms. He reported that he has been doing okay, especially since he has been able to work more hours. He shared that he will be fully moved in with his mother over the next week or so. He identified the pros and cons of his move. 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 He reported that he continues to drink but only at home.  Suicidal/Homicidal: Nowithout intent/plan  Therapist Response: LCSW utilized supportive counseling techniques throughout the session in order to validate emotions and encourage open expression of emotion. LCSW administered a PHQ-9 in order to assess the level of current depressive symptoms.  Plan: Return again in 3 weeks.  Diagnosis: Axis I: See current hospital problem list    Axis II: No diagnosis    Metta Clines, LCSW 04/27/2016

## 2016-04-27 NOTE — Progress Notes (Signed)
Patient denies any allergies to rubbing alcohol and latex

## 2016-04-28 LAB — TESTOSTERONE TOTAL,FREE,BIO, MALES
ALBUMIN: 4 g/dL (ref 3.6–5.1)
Sex Hormone Binding: 35 nmol/L (ref 10–50)
TESTOSTERONE: 284 ng/dL (ref 250–827)
Testosterone, Bioavailable: 64.7 ng/dL — ABNORMAL LOW (ref 130.5–681.7)
Testosterone, Free: 35.2 pg/mL — ABNORMAL LOW (ref 47.0–244.0)

## 2016-04-29 ENCOUNTER — Other Ambulatory Visit: Payer: Self-pay

## 2016-05-11 ENCOUNTER — Telehealth: Payer: Self-pay | Admitting: Family Medicine

## 2016-05-11 NOTE — Telephone Encounter (Signed)
Patient came to speak with nurse regarding his results and also to speak about what the next step is for him. Please follow up with him.  Thank you.

## 2016-05-12 MED FILL — ?OMEPRAZOLE DR 20 MG CAPSUL: 20 | 30 days supply | Qty: 30 | Fill #2

## 2016-05-12 MED FILL — ATORVASTATIN 40 MG TABLET: 40 | 90 days supply | Qty: 90 | Fill #1

## 2016-05-13 NOTE — Telephone Encounter (Signed)
Pt was called on 9/13. Pt states that he went on mychart to view his results.

## 2016-05-14 ENCOUNTER — Ambulatory Visit (INDEPENDENT_AMBULATORY_CARE_PROVIDER_SITE_OTHER): Payer: Self-pay | Admitting: Licensed Clinical Social Worker

## 2016-05-14 DIAGNOSIS — F32A Depression, unspecified: Secondary | ICD-10-CM

## 2016-05-14 DIAGNOSIS — F329 Major depressive disorder, single episode, unspecified: Secondary | ICD-10-CM

## 2016-05-14 DIAGNOSIS — F1010 Alcohol abuse, uncomplicated: Secondary | ICD-10-CM

## 2016-05-14 NOTE — Progress Notes (Signed)
   THERAPY PROGRESS NOTE  Session Time: 67min  Participation Level: Active  Behavioral Response: CasualAlertDepressed  Type of Therapy: Individual Therapy  Treatment Goals addressed: Coping  Interventions: Solution Focused and Supportive  Summary: [name] [name] is a 55 y.o. male who presents with a depressed mood and appropriate affect. He reported that he had been feeling better but recently got sick again with vomiting throughout the night. He expressed frustration that his doctors can't figure out what's wrong with him. He admitted that his alcohol use may contribute to the issue, but declined to talk further about it. He reported that while he still feels depressed, he is hopeful about some things in the future, including more work trips and time with his sister. He expressed hopelessness about being able to date without having a drivers license. He shared frustrations about living with his mother, as she is sometimes picky with him about his cleanliness and living habits.  Suicidal/Homicidal: Nowithout intent/plan  Therapist Response: LCSW utilized supportive counseling techniques throughout the session in order to validate emotions and encourage open expression of emotion. LCSW utilized Solution Focused Therapy techniques such as finding exceptions and creating change talk. LCSW reviewed CBT techniques with [name] [name], who reported that he has been paying attention to his negative thought patterns and trying to reframe negative thoughts.  Plan: Return again in 3 weeks.  Diagnosis: Axis I: See current hospital problem list    Axis II: No diagnosis    Metta Clines, LCSW 05/14/2016

## 2016-06-05 MED FILL — ?OMEPRAZOLE DR 20 MG CAPSUL: 20 | 30 days supply | Qty: 30 | Fill #3

## 2016-06-09 ENCOUNTER — Other Ambulatory Visit: Payer: Self-pay | Admitting: Licensed Clinical Social Worker

## 2016-06-18 ENCOUNTER — Other Ambulatory Visit: Payer: Self-pay | Admitting: Licensed Clinical Social Worker

## 2016-06-22 ENCOUNTER — Encounter: Payer: Self-pay | Admitting: Family Medicine

## 2016-06-22 ENCOUNTER — Ambulatory Visit: Payer: Self-pay | Attending: Family Medicine | Admitting: Family Medicine

## 2016-06-22 VITALS — BP 140/82 | HR 77 | Temp 97.5°F | Ht 71.0 in | Wt 185.2 lb

## 2016-06-22 DIAGNOSIS — F17210 Nicotine dependence, cigarettes, uncomplicated: Secondary | ICD-10-CM | POA: Insufficient documentation

## 2016-06-22 DIAGNOSIS — N529 Male erectile dysfunction, unspecified: Secondary | ICD-10-CM | POA: Insufficient documentation

## 2016-06-22 DIAGNOSIS — F1010 Alcohol abuse, uncomplicated: Secondary | ICD-10-CM | POA: Insufficient documentation

## 2016-06-22 DIAGNOSIS — IMO0001 Reserved for inherently not codable concepts without codable children: Secondary | ICD-10-CM

## 2016-06-22 DIAGNOSIS — Z79899 Other long term (current) drug therapy: Secondary | ICD-10-CM | POA: Insufficient documentation

## 2016-06-22 DIAGNOSIS — K7460 Unspecified cirrhosis of liver: Secondary | ICD-10-CM | POA: Insufficient documentation

## 2016-06-22 DIAGNOSIS — R030 Elevated blood-pressure reading, without diagnosis of hypertension: Secondary | ICD-10-CM | POA: Insufficient documentation

## 2016-06-22 DIAGNOSIS — B1920 Unspecified viral hepatitis C without hepatic coma: Secondary | ICD-10-CM | POA: Insufficient documentation

## 2016-06-22 DIAGNOSIS — E1165 Type 2 diabetes mellitus with hyperglycemia: Secondary | ICD-10-CM | POA: Insufficient documentation

## 2016-06-22 LAB — GLUCOSE, POCT (MANUAL RESULT ENTRY): POC Glucose: 168 mg/dl — AB (ref 70–99)

## 2016-06-22 LAB — POCT GLYCOSYLATED HEMOGLOBIN (HGB A1C): Hemoglobin A1C: 7.9

## 2016-06-22 MED ORDER — GLIPIZIDE ER 2.5 MG PO TB24: 2.5000 mg | ORAL_TABLET | Freq: Every day | ORAL | 5 refills | Status: DC

## 2016-06-22 NOTE — Progress Notes (Signed)
Pt needs refill on lyrica.  Flu complete.

## 2016-06-22 NOTE — Progress Notes (Signed)
Subjective:  Patient ID: [name] [name], male    DOB: Nov 14, 1960  Age: 55 y.o. 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 DM2: he is not taking glipizide due to subjective low sugar. He reports GI upset and paresthesias with metformin.  He is not compliant with low carb diet. He is exercising. No polyuria or polydipsia. No vision changes. Some neuropathy. He is currently not taking lyrica.   2. ED and low testosterone: labs confirmed low testosterone. He has cirrhosis. He continues to drink alcohol.  He drinks 50 oz of beer nightly. He complains of erectile dysfunction. He is uninsured.  3. Elevated BP: noted today. He denies HA, CP, SOB, swelling and vision changes.   Social History  Substance Use Topics  . Smoking status: Current Every Day Smoker    Packs/day: 0.50    Years: 35.00    Last attempt to quit: 03/26/2015  . Smokeless tobacco: Never Used  . Alcohol use 12.6 oz/week    21 Standard drinks or equivalent per week     Comment: 21 beers a week   Outpatient Medications Prior to Visit  Medication Sig Dispense Refill  . atorvastatin (LIPITOR) 40 MG tablet Take 1 tablet (40 mg total) by mouth daily. (Patient not taking: Reported on 01/23/2016) 90 tablet 3  . Blood Glucose Monitoring Suppl (TRUE METRIX METER) w/Device KIT 1 each by Does not apply route as needed. 1 kit 0  . glucose blood (TRUE METRIX BLOOD GLUCOSE TEST) test strip 1 each by Other route 3 (three) times daily. 100 each 11  . omeprazole (PRILOSEC) 20 MG capsule Take 1 capsule (20 mg total) by mouth daily. 90 capsule 3  . pregabalin (LYRICA) 100 MG capsule Take 1 capsule (100 mg total) by mouth 3 (three) times daily. 270 capsule 3  . tadalafil (CIALIS) 10 MG tablet Take 1 tablet (10 mg total) by mouth daily as needed for erectile dysfunction. For [facility] 30 tablet 3  . TRUEPLUS  LANCETS 28G MISC 1 each by Does not apply route 3 (three) times daily. 100 each 11  . VENTOLIN HFA 108 (90 Base) MCG/ACT inhaler INHALE 2 PUFFS INTO THE LUNGS EVERY 6 HOURS AS NEEDED FOR WHEEZING OR SHORTNESS OF BREATH 54 each 0  . zolpidem (AMBIEN) 10 MG tablet Take 10 mg by mouth at bedtime as needed for sleep. At 2000     No facility-administered medications prior to visit.     ROS Review of Systems  Constitutional: Negative for chills, fatigue, fever and unexpected weight change.  Eyes: Negative for visual disturbance.  Respiratory: Negative for cough and shortness of breath.   Cardiovascular: Negative for chest pain, palpitations and leg swelling.  Gastrointestinal: Negative for abdominal pain, blood in stool, constipation, diarrhea, nausea and vomiting.  Endocrine: Negative for polydipsia, polyphagia and polyuria.  Genitourinary:       Trouble with erection   Musculoskeletal: Negative for arthralgias, back pain, gait problem, myalgias and neck pain.  Skin: Negative for rash.  Allergic/Immunologic: Negative for immunocompromised state.  Hematological: Negative for adenopathy. Does not bruise/bleed easily.  Psychiatric/Behavioral: Negative for dysphoric mood, sleep disturbance and suicidal ideas. The patient is not nervous/anxious.     Objective:  BP 140/82 (BP Location: Left Arm, Cuff Size: Normal)   Pulse 77   Temp 97.5 F (36.4 C) (Oral)  Ht _0  (1.803 m)   Wt 185 lb 3.2 oz (84 kg)   SpO2 97%   BMI 25.83 kg/m    BP/Weight 06/22/2016 04/23/2016 5/91/0289  Systolic BP 022 840 698  Diastolic BP 82 82 69  Wt. (Lbs) 185.2 188.2 183  BMI 25.83 26.25 25.53    Physical Exam  Constitutional: He appears well-developed and well-nourished. No distress.  HENT:  Head: Normocephalic and atraumatic.  Neck: Normal range of motion. Neck supple.  Cardiovascular: Normal rate, regular rhythm, normal heart sounds and intact distal pulses.   Pulmonary/Chest: Effort normal and  breath sounds normal.  Musculoskeletal: He exhibits no edema.  Neurological: He is alert.  Skin: Skin is warm and dry. No rash noted. No erythema.  Psychiatric: He has a normal mood and affect.   Lab Results  Component Value Date   HGBA1C 7.5 04/23/2016   Lab Results  Component Value Date   HGBA1C 7.9 06/22/2016    CBG 168 Assessment & Plan:  Khyler was seen today for diabetes.  Diagnoses and all orders for this visit:  Uncontrolled type 2 diabetes mellitus without complication, without long-term current use of insulin (HCC) -     POCT glucose (manual entry) -     POCT glycosylated hemoglobin (Hb A1C) -     glipiZIDE (GLUCOTROL XL) 2.5 MG 24 hr tablet; Take 1 tablet (2.5 mg total) by mouth daily with breakfast.   There are no diagnoses linked to this encounter.  No orders of the defined types were placed in this encounter.   Follow-up: Return in about 8 weeks (around 08/17/2016) for erectile dysfunction .   Boykin Nearing MD

## 2016-06-22 NOTE — Patient Instructions (Addendum)
[name] [name] was seen today for diabetes.  Diagnoses and all orders for this visit:  Uncontrolled type 2 diabetes mellitus without complication, without long-term current use of insulin (HCC) -     POCT glucose (manual entry) -     POCT glycosylated hemoglobin (Hb A1C) -     glipiZIDE (GLUCOTROL XL) 2.5 MG 24 hr tablet; Take 1 tablet (2.5 mg total) by mouth daily with breakfast.  Erectile dysfunction, unspecified erectile dysfunction type   You will be called with testosterone replacement schedule  F.u in 8 weeks for erectile dysfunction   Dr. Adrian Blackwater

## 2016-06-22 NOTE — Assessment & Plan Note (Signed)
A: A1c is a bit elevated P: Restart glipizide 2.5 mg XL daily

## 2016-06-22 NOTE — Assessment & Plan Note (Signed)
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 Low testosterone. He is uninsured and  Therefore cannot see urology P: Testerone hormone replacement Patient counseled to reduce alcohol intake slowly by cutting down for 50 oz nightly to 25 oz nightly.

## 2016-06-29 ENCOUNTER — Ambulatory Visit (INDEPENDENT_AMBULATORY_CARE_PROVIDER_SITE_OTHER): Payer: Self-pay | Admitting: Licensed Clinical Social Worker

## 2016-06-29 DIAGNOSIS — F1010 Alcohol abuse, uncomplicated: Secondary | ICD-10-CM

## 2016-06-29 DIAGNOSIS — F329 Major depressive disorder, single episode, unspecified: Secondary | ICD-10-CM

## 2016-06-29 DIAGNOSIS — F32A Depression, unspecified: Secondary | ICD-10-CM

## 2016-06-29 NOTE — Progress Notes (Signed)
   THERAPY PROGRESS NOTE  Session Time: 70min  Participation Level: Active  Behavioral Response: Casual and Well GroomedAlertEuthymic  Type of Therapy: Individual Therapy  Treatment Goals addressed: Coping  Interventions: Supportive  Summary: [name] [name] is a 55 y.o. male who presents with a euthymic mood and appropriate affect. He shared about his recent work trips to . He reported that he felt much better having some financial stability after the work trips, even though the work tends to be sporadic. He expressed satisfaction in receiving appreciation from his supervisors, especially because he does not feel appreciated by his boss at his other part-time job. Donyel shared about his current level of drinking, which he classified as "not a problem." He reported that he drinks most nights but does not drive. He shared about not feeling like going to work one morning and having a beer at 7am. He shared that his personal goal for the next month is to be flexible in adapting to change in his life, particularly living with his mother.  Suicidal/Homicidal: Nowithout intent/plan  Therapist Response: LCSW utilized supportive counseling techniques throughout the session in order to validate emotions and encourage open expression of emotion. LCSW and Cleophis processed about his current stressors and coping skills. LCSW checked in about his alcohol use.  Plan: Return again in 4 weeks.  Diagnosis: Axis I: See current hospital problem list    Axis II: No diagnosis    Metta Clines, LCSW 06/29/2016

## 2016-07-20 MED FILL — glipiZIDE XL 2.5 MG TB24: 2.5 | 30 days supply | Qty: 30 | Fill #0

## 2016-07-20 MED FILL — ATORVASTATIN 40 MG TABLET: 40 | 90 days supply | Qty: 90 | Fill #2

## 2016-07-20 MED FILL — ?OMEPRAZOLE DR 20 MG CAPSUL: 20 | 30 days supply | Qty: 30 | Fill #4

## 2016-07-30 ENCOUNTER — Other Ambulatory Visit: Payer: Self-pay | Admitting: Licensed Clinical Social Worker

## 2016-08-13 ENCOUNTER — Telehealth: Payer: Self-pay | Admitting: Family Medicine

## 2016-08-13 DIAGNOSIS — E291 Testicular hypofunction: Secondary | ICD-10-CM | POA: Insufficient documentation

## 2016-08-13 MED ORDER — TESTOSTERONE CYPIONATE 200 MG/ML IM SOLN: 200.0000 mg | INTRAMUSCULAR | 3 refills | Status: DC

## 2016-08-13 NOTE — Telephone Encounter (Signed)
Pt was called and a VM was left informing pt that he needs to return the phone call.

## 2016-08-13 NOTE — Telephone Encounter (Signed)
Please inform patient testosterone replacement therapy ordered   First step Is repeat testosterone level and PSA, CMP and lipids, fasting labs ordered  Second step testosterone injection has been ordered and [facility] application will be started It is every 2 weeks injections usually for 3-6 months, sometimes longer depending on response

## 2016-08-18 ENCOUNTER — Ambulatory Visit: Payer: Self-pay | Attending: Family Medicine | Admitting: Family Medicine

## 2016-08-18 ENCOUNTER — Encounter: Payer: Self-pay | Admitting: Family Medicine

## 2016-08-18 ENCOUNTER — Encounter (INDEPENDENT_AMBULATORY_CARE_PROVIDER_SITE_OTHER): Payer: Self-pay

## 2016-08-18 VITALS — BP 160/88 | HR 79 | Temp 97.8°F | Ht 71.0 in | Wt 182.4 lb

## 2016-08-18 DIAGNOSIS — R1032 Left lower quadrant pain: Secondary | ICD-10-CM | POA: Insufficient documentation

## 2016-08-18 DIAGNOSIS — IMO0001 Reserved for inherently not codable concepts without codable children: Secondary | ICD-10-CM

## 2016-08-18 DIAGNOSIS — F1010 Alcohol abuse, uncomplicated: Secondary | ICD-10-CM | POA: Insufficient documentation

## 2016-08-18 DIAGNOSIS — R030 Elevated blood-pressure reading, without diagnosis of hypertension: Secondary | ICD-10-CM | POA: Insufficient documentation

## 2016-08-18 DIAGNOSIS — K7460 Unspecified cirrhosis of liver: Secondary | ICD-10-CM | POA: Insufficient documentation

## 2016-08-18 DIAGNOSIS — Z79899 Other long term (current) drug therapy: Secondary | ICD-10-CM | POA: Insufficient documentation

## 2016-08-18 DIAGNOSIS — F17210 Nicotine dependence, cigarettes, uncomplicated: Secondary | ICD-10-CM | POA: Insufficient documentation

## 2016-08-18 DIAGNOSIS — N529 Male erectile dysfunction, unspecified: Secondary | ICD-10-CM | POA: Insufficient documentation

## 2016-08-18 DIAGNOSIS — E1165 Type 2 diabetes mellitus with hyperglycemia: Secondary | ICD-10-CM | POA: Insufficient documentation

## 2016-08-18 DIAGNOSIS — E1142 Type 2 diabetes mellitus with diabetic polyneuropathy: Secondary | ICD-10-CM | POA: Insufficient documentation

## 2016-08-18 DIAGNOSIS — Z7982 Long term (current) use of aspirin: Secondary | ICD-10-CM | POA: Insufficient documentation

## 2016-08-18 DIAGNOSIS — E291 Testicular hypofunction: Secondary | ICD-10-CM | POA: Insufficient documentation

## 2016-08-18 DIAGNOSIS — B1920 Unspecified viral hepatitis C without hepatic coma: Secondary | ICD-10-CM | POA: Insufficient documentation

## 2016-08-18 LAB — GLUCOSE, POCT (MANUAL RESULT ENTRY): POC GLUCOSE: 155 mg/dL — AB (ref 70–99)

## 2016-08-18 LAB — PSA: PSA: 0.3 ng/mL (ref <=4.0)

## 2016-08-18 LAB — POCT GLYCOSYLATED HEMOGLOBIN (HGB A1C): HEMOGLOBIN A1C: 8.1

## 2016-08-18 MED ORDER — PREGABALIN 100 MG PO CAPS: 100.0000 mg | ORAL_CAPSULE | Freq: Three times a day (TID) | ORAL | 3 refills | Status: DC

## 2016-08-18 MED ORDER — ATORVASTATIN CALCIUM 40 MG PO TABS: 40.0000 mg | ORAL_TABLET | Freq: Every day | ORAL | 11 refills | Status: DC

## 2016-08-18 MED ORDER — ASPIRIN EC 81 MG PO TBEC: 81.0000 mg | DELAYED_RELEASE_TABLET | Freq: Every day | ORAL | 11 refills | Status: DC

## 2016-08-18 MED ORDER — CANAGLIFLOZIN 100 MG PO TABS
100.0000 mg | ORAL_TABLET | Freq: Every day | ORAL | 2 refills | Status: DC
Start: 2016-08-18 — End: 2016-09-14

## 2016-08-18 MED FILL — TRUEplus LANCETS 28G MISC: 30 days supply | Qty: 100 | Fill #0

## 2016-08-18 MED FILL — glipiZIDE XL 2.5 MG TB24: 2.5 | 30 days supply | Qty: 30 | Fill #1

## 2016-08-18 NOTE — Assessment & Plan Note (Signed)
Awaiting testosterone IM replacement He will have PSA, CMP and lipid check today

## 2016-08-18 NOTE — Progress Notes (Signed)
Pt is here today to check his A1C, pt also states that he is having pain in his left side. Pt is also here for ED

## 2016-08-18 NOTE — Progress Notes (Signed)
Subjective:  Patient ID: [name] [name], male    DOB: 24-Nov-1960  Age: 55 y.o. 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 DM2: he is not taking glipizide due to subjective low sugar. He reports GI upset and paresthesias with metformin.  He is compliant with low carb diet. He is exercising. No polyuria or polydipsia. No vision changes. Some neuropathy. He is currently not taking lyrica.   2. ED and low testosterone: labs confirmed low testosterone. He has cirrhosis. He continues to drink alcohol.  He drinks 50 oz of beer nightly. He complains of erectile dysfunction. He is uninsured.  3. Elevated BP: noted today. He denies HA, CP, SOB, swelling and vision changes.   Social History  Substance Use Topics  . Smoking status: Current Every Day Smoker    Packs/day: 0.50    Years: 35.00    Last attempt to quit: 03/26/2015  . Smokeless tobacco: Never Used  . Alcohol use 12.6 oz/week    21 Standard drinks or equivalent per week     Comment: 21 beers a week   Outpatient Medications Prior to Visit  Medication Sig Dispense Refill  . atorvastatin (LIPITOR) 40 MG tablet Take 1 tablet (40 mg total) by mouth daily. (Patient not taking: Reported on 06/22/2016) 90 tablet 3  . Blood Glucose Monitoring Suppl (TRUE METRIX METER) w/Device KIT 1 each by Does not apply route as needed. 1 kit 0  . glipiZIDE (GLUCOTROL XL) 2.5 MG 24 hr tablet Take 1 tablet (2.5 mg total) by mouth daily with breakfast. 30 tablet 5  . glucose blood (TRUE METRIX BLOOD GLUCOSE TEST) test strip 1 each by Other route 3 (three) times daily. 100 each 11  . omeprazole (PRILOSEC) 20 MG capsule Take 1 capsule (20 mg total) by mouth daily. 90 capsule 3  . pregabalin (LYRICA) 100 MG capsule Take 1 capsule (100 mg total) by mouth 3 (three) times daily. 270 capsule 3  . testosterone cypionate  (DEPOTESTOSTERONE CYPIONATE) 200 MG/ML injection Inject 1 mL (200 mg total) into the muscle every 14 (fourteen) days. For PASS 6 mL 3  . TRUEPLUS LANCETS 28G MISC 1 each by Does not apply route 3 (three) times daily. 100 each 11  . VENTOLIN HFA 108 (90 Base) MCG/ACT inhaler INHALE 2 PUFFS INTO THE LUNGS EVERY 6 HOURS AS NEEDED FOR WHEEZING OR SHORTNESS OF BREATH 54 each 0  . zolpidem (AMBIEN) 10 MG tablet Take 10 mg by mouth at bedtime as needed for sleep. At 2000     No facility-administered medications prior to visit.     ROS Review of Systems  Constitutional: Negative for chills, fatigue, fever and unexpected weight change.  Eyes: Negative for visual disturbance.  Respiratory: Negative for cough and shortness of breath.   Cardiovascular: Negative for chest pain, palpitations and leg swelling.  Gastrointestinal: Positive for abdominal pain. Negative for abdominal distention, anal bleeding, blood in stool, constipation, diarrhea, nausea, rectal pain and vomiting.  Endocrine: Negative for polydipsia, polyphagia and polyuria.  Genitourinary:       Trouble with erection   Musculoskeletal: Negative for arthralgias, back pain, gait problem, myalgias and neck pain.  Skin: Negative for rash.  Allergic/Immunologic: Negative for immunocompromised state.  Hematological: Negative for adenopathy. Does not bruise/bleed easily.  Psychiatric/Behavioral: Negative for dysphoric mood, sleep disturbance and suicidal ideas. The patient  is not nervous/anxious.     Objective:  BP (!) 160/88 (BP Location: Left Arm, Patient Position: Sitting, Cuff Size: Small)   Pulse 79   Temp 97.8 F (36.6 C) (Oral)   Ht '5\' 11"'  (1.803 m)   Wt 182 lb 6.4 oz (82.7 kg)   SpO2 97%   BMI 25.44 kg/m    BP/Weight 08/18/2016 06/22/2016 7/86/7544  Systolic BP 920 100 712  Diastolic BP 88 82 82  Wt. (Lbs) 182.4 185.2 188.2  BMI 25.44 25.83 26.25    Physical Exam  Constitutional: He appears well-developed and  well-nourished. No distress.  HENT:  Head: Normocephalic and atraumatic.  Neck: Normal range of motion. Neck supple.  Cardiovascular: Normal rate, regular rhythm, normal heart sounds and intact distal pulses.   Pulmonary/Chest: Effort normal and breath sounds normal.  Abdominal: Soft. Bowel sounds are normal. He exhibits no distension and no mass. There is no tenderness. There is no rebound and no guarding.  Musculoskeletal: He exhibits no edema.  Neurological: He is alert.  Skin: Skin is warm and dry. No rash noted. No erythema.  Psychiatric: He has a normal mood and affect.   Lab Results  Component Value Date   HGBA1C 7.9 06/22/2016   Lab Results  Component Value Date   HGBA1C 8.1 08/18/2016    CBG 155 Assessment & Plan:  [name] [name] was seen today for diabetes.  Diagnoses and all orders for this visit:  Uncontrolled type 2 diabetes mellitus without complication, without long-term current use of insulin (HCC) -     Glucose (CBG) -     POCT glycosylated hemoglobin (Hb A1C) -     canagliflozin (INVOKANA) 100 MG TABS tablet; Take 1 tablet (100 mg total) by mouth daily before breakfast. -     aspirin EC 81 MG tablet; Take 1 tablet (81 mg total) by mouth daily. -     atorvastatin (LIPITOR) 40 MG tablet; Take 1 tablet (40 mg total) by mouth daily.  Diabetic peripheral neuropathy associated with type 2 diabetes mellitus (HCC) -     pregabalin (LYRICA) 100 MG capsule; Take 1 capsule (100 mg total) by mouth 3 (three) times daily.  Left lower quadrant pain -     Lipase  Hypogonadism male -     COMPLETE METABOLIC PANEL WITH GFR -     Lipid Panel -     PSA   [name] [name] was seen today for diabetes.  Diagnoses and all orders for this visit:  Uncontrolled type 2 diabetes mellitus without complication, without long-term current use of insulin (HCC) -     Glucose (CBG) -     POCT glycosylated hemoglobin (Hb A1C) -     canagliflozin (INVOKANA) 100 MG TABS tablet; Take 1 tablet (100 mg  total) by mouth daily before breakfast. -     aspirin EC 81 MG tablet; Take 1 tablet (81 mg total) by mouth daily. -     atorvastatin (LIPITOR) 40 MG tablet; Take 1 tablet (40 mg total) by mouth daily.  Diabetic peripheral neuropathy associated with type 2 diabetes mellitus (HCC) -     pregabalin (LYRICA) 100 MG capsule; Take 1 capsule (100 mg total) by mouth 3 (three) times daily.  Left lower quadrant pain -     Lipase  Hypogonadism male -     COMPLETE METABOLIC PANEL WITH GFR -     Lipid Panel -     PSA    No orders of the defined types were placed in  this encounter.   Follow-up: No Follow-up on file.   Boykin Nearing MD

## 2016-08-18 NOTE — Patient Instructions (Addendum)
Cordis was seen today for diabetes.  Diagnoses and all orders for this visit:  Uncontrolled type 2 diabetes mellitus without complication, without long-term current use of insulin (HCC) -     Glucose (CBG) -     POCT glycosylated hemoglobin (Hb A1C) -     canagliflozin (INVOKANA) 100 MG TABS tablet; Take 1 tablet (100 mg total) by mouth daily before breakfast. -     aspirin EC 81 MG tablet; Take 1 tablet (81 mg total) by mouth daily. -     atorvastatin (LIPITOR) 40 MG tablet; Take 1 tablet (40 mg total) by mouth daily.  Diabetic peripheral neuropathy associated with type 2 diabetes mellitus (HCC) -     pregabalin (LYRICA) 100 MG capsule; Take 1 capsule (100 mg total) by mouth 3 (three) times daily.  Left lower quadrant pain -     Lipase   Start invokana for diabetes   You will be called with testosterone comes in  F/u in 3 months for diabetes   Dr. Adrian Blackwater

## 2016-08-18 NOTE — Assessment & Plan Note (Signed)
Declined No meds start invokana as patient also has elevated BP Recommend and ordered ASA and lipitor for primary prevention of heart disease and stroke

## 2016-08-19 LAB — COMPLETE METABOLIC PANEL WITH GFR
ALK PHOS: 56 U/L (ref 40–115)
ALT: 46 U/L (ref 9–46)
AST: 36 U/L — ABNORMAL HIGH (ref 10–35)
Albumin: 4.3 g/dL (ref 3.6–5.1)
BILIRUBIN TOTAL: 0.6 mg/dL (ref 0.2–1.2)
BUN: 16 mg/dL (ref 7–25)
CALCIUM: 9.4 mg/dL (ref 8.6–10.3)
CO2: 25 mmol/L (ref 20–31)
CREATININE: 0.68 mg/dL — AB (ref 0.70–1.33)
Chloride: 104 mmol/L (ref 98–110)
GFR, Est Non African American: >89 mL/min (ref >=60)
Glucose, Bld: 132 mg/dL — ABNORMAL HIGH (ref 65–99)
Potassium: 4.5 mmol/L (ref 3.5–5.3)
Sodium: 138 mmol/L (ref 135–146)
TOTAL PROTEIN: 7.5 g/dL (ref 6.1–8.1)

## 2016-08-19 LAB — LIPID PANEL
CHOLESTEROL: 195 mg/dL (ref <200)
HDL: 48 mg/dL (ref >40)
LDL Cholesterol: 117 mg/dL — ABNORMAL HIGH (ref <100)
TRIGLYCERIDES: 151 mg/dL — AB (ref <150)
Total CHOL/HDL Ratio: 4.1 Ratio (ref <5.0)
VLDL: 30 mg/dL (ref <30)

## 2016-08-19 LAB — LIPASE: Lipase: 29 U/L (ref 7–60)

## 2016-08-26 ENCOUNTER — Telehealth: Payer: Self-pay

## 2016-08-26 NOTE — Telephone Encounter (Signed)
Pt was called and informed of lab results and medications being sent to onsite pharmacy.

## 2016-08-28 ENCOUNTER — Ambulatory Visit: Payer: Self-pay | Attending: Family Medicine

## 2016-08-28 ENCOUNTER — Other Ambulatory Visit: Payer: Self-pay | Admitting: Family Medicine

## 2016-08-28 DIAGNOSIS — F17200 Nicotine dependence, unspecified, uncomplicated: Secondary | ICD-10-CM

## 2016-08-28 MED ORDER — ALBUTEROL SULFATE HFA 108 (90 BASE) MCG/ACT IN AERS: 2.0000 | INHALATION_SPRAY | Freq: Four times a day (QID) | RESPIRATORY_TRACT | 0 refills | Status: DC | PRN

## 2016-08-28 MED FILL — OMEPRAZOLE DR 20 MG CAPSULE: 20 | 30 days supply | Qty: 30 | Fill #5

## 2016-08-28 MED FILL — $CIALIS 10MG TABLET: 10 | 30 days supply | Qty: 10 | Fill #0

## 2016-08-28 NOTE — Telephone Encounter (Signed)
Patient was seen on 08/18/16 and received refills. The inhaler was not apart of the refills. Is this request appropriate?

## 2016-09-11 ENCOUNTER — Telehealth: Payer: Self-pay | Admitting: Family Medicine

## 2016-09-11 NOTE — Telephone Encounter (Signed)
Pt. Called requesting to speak with his nurse regarding for his testosterone cypionate (DEPOTESTOSTERONE CYPIONATE) 200 MG/ML injection Please f/u with pt.

## 2016-09-14 ENCOUNTER — Other Ambulatory Visit: Payer: Self-pay

## 2016-09-14 ENCOUNTER — Ambulatory Visit: Payer: Self-pay | Attending: Family Medicine

## 2016-09-14 DIAGNOSIS — E291 Testicular hypofunction: Secondary | ICD-10-CM

## 2016-09-14 DIAGNOSIS — E1165 Type 2 diabetes mellitus with hyperglycemia: Secondary | ICD-10-CM

## 2016-09-14 DIAGNOSIS — IMO0001 Reserved for inherently not codable concepts without codable children: Secondary | ICD-10-CM

## 2016-09-14 MED ORDER — TESTOSTERONE CYPIONATE 200 MG/ML IM SOLN: 200.0000 mg | INTRAMUSCULAR | 5 refills | Status: DC

## 2016-09-14 MED ORDER — CANAGLIFLOZIN 100 MG PO TABS: 100.0000 mg | ORAL_TABLET | Freq: Every day | ORAL | 3 refills | Status: DC

## 2016-09-14 NOTE — Telephone Encounter (Signed)
Pt medication is being processed through [facility].

## 2016-09-30 MED FILL — ?OMEPRAZOLE DR 20 MG CAPSUL: 20 | 30 days supply | Qty: 30 | Fill #6

## 2016-09-30 MED FILL — INVOKANA 100 MG TABLET: 100 | 30 days supply | Qty: 30 | Fill #0

## 2016-09-30 MED FILL — TRUE METRIX TEST STRIP: 33 days supply | Qty: 100 | Fill #1

## 2016-10-02 MED FILL — TESTOSTERONE CYP 200 MG/ML: 200 | 14 days supply | Qty: 1 | Fill #0

## 2016-10-26 NOTE — Addendum Note (Signed)
Addended by: Metta Clines on: 10/26/2016 02:40 PM   Modules accepted: Level of Service

## 2016-10-26 NOTE — Addendum Note (Signed)
Addended by: Metta Clines on: 10/26/2016 02:42 PM   Modules accepted: Level of Service

## 2016-10-26 NOTE — Addendum Note (Signed)
Addended by: Metta Clines on: 10/26/2016 02:34 PM   Modules accepted: Level of Service

## 2016-10-27 NOTE — Addendum Note (Signed)
Addended by: Metta Clines on: 10/27/2016 03:37 PM   Modules accepted: Level of Service

## 2016-10-27 NOTE — Addendum Note (Signed)
Addended by: Metta Clines on: 10/27/2016 03:50 PM   Modules accepted: Level of Service

## 2016-10-27 NOTE — Addendum Note (Signed)
Addended by: Metta Clines on: 10/27/2016 03:38 PM   Modules accepted: Level of Service

## 2016-11-02 MED FILL — ?OMEPRAZOLE DR 20 MG CAPSUL: 20 | 30 days supply | Qty: 30 | Fill #7

## 2016-11-02 MED FILL — INVOKANA 100 MG TABLET: 100 | 30 days supply | Qty: 30 | Fill #1

## 2016-11-10 MED FILL — TESTOSTERONE CYP 200 MG/ML: 200 | 14 days supply | Qty: 1 | Fill #1

## 2016-11-17 MED FILL — CLINDAMYCIN HCL 150 MG CAPS: 150 | 7 days supply | Qty: 21 | Fill #0

## 2016-11-17 MED FILL — ACETAMINOPHEN/COD #3 TABLET: 300-30 | 4 days supply | Qty: 16 | Fill #0

## 2016-12-01 ENCOUNTER — Other Ambulatory Visit: Payer: Self-pay | Admitting: Licensed Clinical Social Worker

## 2016-12-01 MED FILL — ?OMEPRAZOLE DR 20 MG CAPSUL: 20 | 30 days supply | Qty: 30 | Fill #8

## 2016-12-01 MED FILL — INVOKANA 100 MG TABLET: 100 | 30 days supply | Qty: 30 | Fill #2

## 2016-12-08 MED FILL — TESTOSTERONE CYP 200 MG/ML: 200 | 14 days supply | Qty: 1 | Fill #2

## 2016-12-11 ENCOUNTER — Ambulatory Visit (INDEPENDENT_AMBULATORY_CARE_PROVIDER_SITE_OTHER): Payer: Self-pay | Admitting: Licensed Clinical Social Worker

## 2016-12-11 DIAGNOSIS — F329 Major depressive disorder, single episode, unspecified: Secondary | ICD-10-CM

## 2016-12-11 DIAGNOSIS — F32A Depression, unspecified: Secondary | ICD-10-CM

## 2016-12-22 NOTE — Progress Notes (Signed)
   THERAPY PROGRESS NOTE  Session Time: 25min  Participation Level: Active  Behavioral Response: CasualAlertDepressed  Type of Therapy: Individual Therapy  Treatment Goals addressed: Coping  Interventions: Strength-based and Supportive  Summary: [name] [name] is a 56 y.o. male who presents with a slightly depressed mood and appropriate affect. He shared that he has not been to a session in a long time due to traveling for work and staying busy. He reported that he continues to feel depressed and feels that there is no change. He shared that the main thing that would help with his depression would be to start dating, but that without a license and with erectile dysfunction issues, that feels impossible. He did not appear receptive to LCSW feedback about how it is possible to date even while struggling with issues. [name] reported that he is currently coping by exercising, spending time with his dog, and drinking a few beers at night. He ended the session abruptly, saying that he "only wanted to give an update."   Suicidal/Homicidal: Nowithout intent/plan  Therapist Response: LCSW utilized supportive counseling techniques throughout the session in order to validate emotions and encourage open expression of emotion. LCSW attempted to use CBT to examine Harvey's thoughts about dating being "impossible," but he did not want to stay on the topic for long. LCSW encouraged him to keep using good coping skills like exercise and time with his dog.  Plan: Return again in 4 weeks.     Metta Clines, LCSW 12/22/2016

## 2016-12-24 MED FILL — TESTOSTERONE CYP 200 MG/ML: 200 | 14 days supply | Qty: 1 | Fill #0

## 2017-01-12 ENCOUNTER — Encounter: Payer: Self-pay | Admitting: Gastroenterology

## 2017-01-13 ENCOUNTER — Encounter: Payer: Self-pay | Admitting: Family Medicine

## 2017-01-13 MED FILL — INVOKANA 100 MG TABLET: 100 | 30 days supply | Qty: 30 | Fill #0

## 2017-01-13 MED FILL — OMEPRAZOLE DR 20 MG CAPSULE: 20 | 30 days supply | Qty: 30 | Fill #9

## 2017-01-15 ENCOUNTER — Other Ambulatory Visit: Payer: Self-pay | Admitting: Licensed Clinical Social Worker

## 2017-01-19 MED FILL — $TESTOSTERONE CYP 200MG/ML: 200 | 14 days supply | Qty: 1 | Fill #3

## 2017-01-22 ENCOUNTER — Encounter: Payer: Self-pay | Admitting: Gastroenterology

## 2017-02-22 ENCOUNTER — Other Ambulatory Visit: Payer: Self-pay | Admitting: Gastroenterology

## 2017-02-22 MED FILL — OMEPRAZOLE DR 20 MG CAPSULE: 20 | 30 days supply | Qty: 90 | Fill #0

## 2017-02-22 MED FILL — INVOKANA 100 MG TABLET: 100 | 30 days supply | Qty: 30 | Fill #1

## 2017-02-24 ENCOUNTER — Other Ambulatory Visit: Payer: Self-pay | Admitting: Family Medicine

## 2017-02-24 DIAGNOSIS — F17200 Nicotine dependence, unspecified, uncomplicated: Secondary | ICD-10-CM

## 2017-02-25 ENCOUNTER — Other Ambulatory Visit: Payer: Self-pay

## 2017-02-25 DIAGNOSIS — E291 Testicular hypofunction: Secondary | ICD-10-CM

## 2017-02-25 MED ORDER — TESTOSTERONE CYPIONATE 200 MG/ML IM SOLN: 200.0000 mg | INTRAMUSCULAR | 5 refills | Status: DC

## 2017-03-02 MED ORDER — ALBUTEROL SULFATE HFA 108 (90 BASE) MCG/ACT IN AERS: 2.0000 | INHALATION_SPRAY | Freq: Four times a day (QID) | RESPIRATORY_TRACT | 0 refills | Status: DC | PRN

## 2017-04-02 ENCOUNTER — Ambulatory Visit: Payer: Self-pay | Attending: Family Medicine | Admitting: Family Medicine

## 2017-04-02 ENCOUNTER — Encounter: Payer: Self-pay | Admitting: Family Medicine

## 2017-04-02 ENCOUNTER — Ambulatory Visit: Payer: Self-pay | Attending: Family Medicine | Admitting: Licensed Clinical Social Worker

## 2017-04-02 VITALS — BP 160/92 | HR 89 | Temp 98.1°F | Ht 71.0 in | Wt 174.8 lb

## 2017-04-02 DIAGNOSIS — I10 Essential (primary) hypertension: Secondary | ICD-10-CM | POA: Insufficient documentation

## 2017-04-02 DIAGNOSIS — K7460 Unspecified cirrhosis of liver: Secondary | ICD-10-CM | POA: Insufficient documentation

## 2017-04-02 DIAGNOSIS — Z7982 Long term (current) use of aspirin: Secondary | ICD-10-CM | POA: Insufficient documentation

## 2017-04-02 DIAGNOSIS — F419 Anxiety disorder, unspecified: Secondary | ICD-10-CM

## 2017-04-02 DIAGNOSIS — IMO0001 Reserved for inherently not codable concepts without codable children: Secondary | ICD-10-CM

## 2017-04-02 DIAGNOSIS — F329 Major depressive disorder, single episode, unspecified: Secondary | ICD-10-CM

## 2017-04-02 DIAGNOSIS — M25512 Pain in left shoulder: Secondary | ICD-10-CM | POA: Insufficient documentation

## 2017-04-02 DIAGNOSIS — F17210 Nicotine dependence, cigarettes, uncomplicated: Secondary | ICD-10-CM | POA: Insufficient documentation

## 2017-04-02 DIAGNOSIS — Z8619 Personal history of other infectious and parasitic diseases: Secondary | ICD-10-CM | POA: Insufficient documentation

## 2017-04-02 DIAGNOSIS — F1010 Alcohol abuse, uncomplicated: Secondary | ICD-10-CM

## 2017-04-02 DIAGNOSIS — Z79899 Other long term (current) drug therapy: Secondary | ICD-10-CM | POA: Insufficient documentation

## 2017-04-02 DIAGNOSIS — N529 Male erectile dysfunction, unspecified: Secondary | ICD-10-CM | POA: Insufficient documentation

## 2017-04-02 DIAGNOSIS — E1165 Type 2 diabetes mellitus with hyperglycemia: Secondary | ICD-10-CM

## 2017-04-02 DIAGNOSIS — E119 Type 2 diabetes mellitus without complications: Secondary | ICD-10-CM | POA: Insufficient documentation

## 2017-04-02 DIAGNOSIS — Z7984 Long term (current) use of oral hypoglycemic drugs: Secondary | ICD-10-CM | POA: Insufficient documentation

## 2017-04-02 LAB — GLUCOSE, POCT (MANUAL RESULT ENTRY): POC Glucose: 132 mg/dl — AB (ref 70–99)

## 2017-04-02 LAB — POCT GLYCOSYLATED HEMOGLOBIN (HGB A1C): HEMOGLOBIN A1C: 7

## 2017-04-02 LAB — POCT INR: INR: 1.1

## 2017-04-02 MED ORDER — ATORVASTATIN CALCIUM 40 MG PO TABS: 40.0000 mg | ORAL_TABLET | Freq: Every day | ORAL | 11 refills | Status: DC

## 2017-04-02 MED ORDER — ASPIRIN EC 81 MG PO TBEC: 81.0000 mg | DELAYED_RELEASE_TABLET | Freq: Every day | ORAL | 11 refills | Status: DC

## 2017-04-02 MED ORDER — CANAGLIFLOZIN 100 MG PO TABS: 100.0000 mg | ORAL_TABLET | Freq: Every day | ORAL | 11 refills | Status: DC

## 2017-04-02 MED ORDER — LOSARTAN POTASSIUM 50 MG PO TABS: 50.0000 mg | ORAL_TABLET | Freq: Every day | ORAL | 3 refills | Status: DC

## 2017-04-02 MED ORDER — MULTIVITAMIN ADULT PO TABS: 1.0000 | ORAL_TABLET | Freq: Every day | ORAL | 11 refills | Status: DC

## 2017-04-02 MED FILL — INVOKANA 100 MG TABLET: 100 | 30 days supply | Qty: 30 | Fill #0

## 2017-04-02 MED FILL — LOSARTAN POTASSIUM 50 MG TA: 50 | 30 days supply | Qty: 30 | Fill #0

## 2017-04-02 MED FILL — ATORVASTATIN 40 MG TABLET: 40 | 30 days supply | Qty: 30 | Fill #0

## 2017-04-02 NOTE — Assessment & Plan Note (Signed)
A: hypertensive on 2 consecutive visit, diabetic P: Losartan 50 mg daily

## 2017-04-02 NOTE — BH Specialist Note (Signed)
Integrated Behavioral Health Initial Visit  MRN: [id number] Name: [name] [name]   Session Start time: 9:10 AM Session End time: 9:25 AM Total time: 15 minutes  Type of Service: [place] Interpretor:No. Interpretor Name and Language: N/A   Warm Hand Off Completed.       SUBJECTIVE: [name] [name] is a 56 y.o. male accompanied by patient. Patient was referred by Dr. Adrian Blackwater for depression and anxiety. Patient reports the following symptoms/concerns: overwhelming feelings of sadness and worry, difficulty sleeping, low energy, withdrawn behavior, difficulty concentrating, feeling bad about self, irritability, and hx of suicidal ideations Duration of problem: Ongoing; Severity of problem: severe  OBJECTIVE: Mood: Dysphoric and Affect: Depressed Risk of harm to self or others: No plan to harm self or others   LIFE CONTEXT: Family and Social: Pt receives support from mother, who he resides with School/Work: Pt is employed full-time. Self-Care: Pt participates in medication management and psychotherapy through [facility]. He engages in substance use (twelve 16 oz of beer daily) Life Changes: Pt reports four DUI's resulting in denial of driver's license applications. Pt does not have any court fees  GOALS ADDRESSED: Patient will reduce symptoms of: anxiety and depression and increase knowledge and/or ability of: coping skills and self-management skills and also: Increase motivation to adhere to plan of care and Decrease self-medicating behaviors   INTERVENTIONS: Motivational Interviewing, Supportive Counseling, Psychoeducation and/or Health Education and Link to [employer]  Standardized Assessments completed: GAD-7 and PHQ 2&9 with C-SSRS  ASSESSMENT: Patient currently experiencing depression and anxiety triggered by substance use. He reports overwhelming feelings of sadness and worry, difficulty sleeping, low energy, withdrawn  behavior, difficulty concentrating, feeling bad about self, irritability, hx of suicidal ideations, and substance use. Pt denies current SI/HI/AVH. Patient participates in psychotherapy with Ardyth Gal and medication management through [facility]. [place] educated pt on how substance use can negatively impact one's physical and mental health. Pt shared that he is interested in participating in support groups to assist in decreasing substance use. LCSWA provided pt with information regarding co-occurring treatment programs. Patient is knowledgeable of crisis intervention resources.   PLAN: 1. Follow up with behavioral health clinician on : Pt was encouraged to contact LCSWA if symptoms worsen or fail to improve to schedule behavioral appointments at [facility]. 2. Behavioral recommendations: LCSWA recommends that pt apply healthy coping skills discussed. Pt is encouraged to schedule follow up appointment with LCSWA 3. Referral(s): Substance Abuse Program 4. "From scale of 1-10, how likely are you to follow plan?": 6/10  Rebekah [place], LCSW 04/05/17 2:53 PM

## 2017-04-02 NOTE — Progress Notes (Addendum)
Subjective:  Patient ID: [name] [name], male    DOB: 12-11-1960  Age: 56 y.o. 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 DM2: he takes invokana. He is not taking Lipitor or lyrica. He drinks alcohol nightly. He has lost weight.     2. Alcohol abuse: he reports drinking 12, 16 oz beers per night. He has history of alcohol abuse and hep C. He report loneliness and depression. He sees a Engineer, water at [employer]. He takes 20 mg of Celexa daily.   3. HTN: elevated BP on 2 consecutive readings. He denies HA, CP, SOB, swelling and vision changes.   4. Left shoulder pain: he reports fall while intoxicated onto lateral shoulder. This occurred 2 weeks ago. He has immediate bruising. He has pain with lifting his arm. No weakness in in hand.   5. Burn on feet: he reports burning on bottom of first feet due to walking barefoot [place]. He initially stated this occurred in September. When I asked, "last year?", he stated not I mean July. He shows a picture of red inflamed skin. He reports the skin peeled. He has not pain, sores or blistering currently.   Social History  Substance Use Topics  . Smoking status: Current Every Day Smoker    Packs/day: 0.50    Years: 35.00    Last attempt to quit: 03/26/2015  . Smokeless tobacco: Never Used  . Alcohol use 12.6 oz/week    21 Standard drinks or equivalent per week     Comment: 21 beers a week   Outpatient Medications Prior to Visit  Medication Sig Dispense Refill  . albuterol (VENTOLIN HFA) 108 (90 Base) MCG/ACT inhaler Inhale 2 puffs into the lungs every 6 (six) hours as needed for wheezing or shortness of breath. 18 each 0  . aspirin EC 81 MG tablet Take 1 tablet (81 mg total) by mouth daily. 30 tablet 11  . Blood Glucose Monitoring Suppl (TRUE METRIX METER) w/Device KIT 1 each by Does not  apply route as needed. 1 kit 0  . canagliflozin (INVOKANA) 100 MG TABS tablet Take 1 tablet (100 mg total) by mouth daily before breakfast. 30 tablet 3  . glucose blood (TRUE METRIX BLOOD GLUCOSE TEST) test strip 1 each by Other route 3 (three) times daily. 100 each 11  . omeprazole (PRILOSEC) 20 MG capsule TAKE ONE CAPSULE BY MOUTH DAILY 90 capsule 3  . testosterone cypionate (DEPOTESTOSTERONE CYPIONATE) 200 MG/ML injection Inject 1 mL (200 mg total) into the muscle every 14 (fourteen) days. For PASS 1 mL 5  . TRUEPLUS LANCETS 28G MISC 1 each by Does not apply route 3 (three) times daily. 100 each 11  . zolpidem (AMBIEN) 10 MG tablet Take 10 mg by mouth at bedtime as needed for sleep. At 2000    . atorvastatin (LIPITOR) 40 MG tablet Take 1 tablet (40 mg total) by mouth daily. (Patient not taking: Reported on 04/02/2017) 30 tablet 11  . pregabalin (LYRICA) 100 MG capsule Take 1 capsule (100 mg total) by mouth 3 (three) times daily. (Patient not taking: Reported on 04/02/2017) 270 capsule 3   No facility-administered medications prior to visit.     ROS Review of Systems  Constitutional: Negative for chills, fatigue, fever and unexpected weight change.  Eyes: Negative for visual disturbance.  Respiratory: Negative  for cough and shortness of breath.   Cardiovascular: Negative for chest pain, palpitations and leg swelling.  Gastrointestinal: Negative for abdominal distention, abdominal pain, anal bleeding, blood in stool, constipation, diarrhea, nausea, rectal pain and vomiting.  Endocrine: Negative for polydipsia, polyphagia and polyuria.  Genitourinary:       Trouble with erection   Musculoskeletal: Positive for arthralgias (L shoulder ). Negative for back pain, gait problem, myalgias and neck pain.  Skin: Negative for rash.  Allergic/Immunologic: Negative for immunocompromised state.  Hematological: Negative for adenopathy. Does not bruise/bleed easily.  Psychiatric/Behavioral: Positive for  dysphoric mood. Negative for sleep disturbance and suicidal ideas. The patient is not nervous/anxious.     Objective:  BP (!) 163/92   Pulse 89   Temp 98.1 F (36.7 C) (Oral)   Ht _0  (1.803 m)   Wt 174 lb 12.8 oz (79.3 kg)   SpO2 99%   BMI 24.38 kg/m    BP/Weight 04/02/2017 08/18/2016 00/86/7619  Systolic BP 509 326 712  Diastolic BP 92 88 82  Wt. (Lbs) 174.8 182.4 185.2  BMI 24.38 25.44 25.83    Physical Exam  Constitutional: He appears well-developed and well-nourished. No distress.  He is intoxicated with slurred speech   HENT:  Head: Normocephalic and atraumatic.  Neck: Normal range of motion. Neck supple.  Cardiovascular: Normal rate, regular rhythm, normal heart sounds and intact distal pulses.   Pulmonary/Chest: Effort normal and breath sounds normal.  Abdominal: Soft. Bowel sounds are normal. He exhibits no distension and no mass. There is no tenderness. There is no rebound and no guarding.  Musculoskeletal: He exhibits no edema.       Left shoulder: He exhibits decreased range of motion, tenderness, bony tenderness and pain. He exhibits no swelling, no effusion, no crepitus, no deformity, no laceration, no spasm, normal pulse and normal strength.  Neurological: He is alert.  Skin: Skin is warm and dry. No rash noted. No erythema.  Psychiatric: He has a normal mood and affect. His speech is slurred. Cognition and memory are impaired. He exhibits abnormal recent memory.   Lab Results  Component Value Date   HGBA1C 8.1 08/18/2016   Lab Results  Component Value Date   HGBA1C 7.0 04/02/2017   Lab Results  Component Value Date   INR 1.1 04/02/2017   INR 1.1 (H) 01/23/2016   INR 1.05 10/16/2014    CBG 132 Assessment & Plan:  Kanav was seen today for diabetes.  Diagnoses and all orders for this visit:  Uncontrolled type 2 diabetes mellitus without complication, without long-term current use of insulin (HCC) -     POCT glucose (manual entry) -     POCT  glycosylated hemoglobin (Hb A1C) -     CMP14+EGFR -     Lipid Panel -     canagliflozin (INVOKANA) 100 MG TABS tablet; Take 1 tablet (100 mg total) by mouth daily before breakfast. -     aspirin EC 81 MG tablet; Take 1 tablet (81 mg total) by mouth daily. -     atorvastatin (LIPITOR) 40 MG tablet; Take 1 tablet (40 mg total) by mouth daily.  Alcohol abuse -     CBC -     Multiple Vitamins-Minerals (MULTIVITAMIN ADULT) TABS; Take 1 tablet by mouth daily. -     Magnesium -     Phosphorus -     Folate -     Vitamin B12 -     POCT INR  Essential hypertension -  losartan (COZAAR) 50 MG tablet; Take 1 tablet (50 mg total) by mouth daily.    No orders of the defined types were placed in this encounter.   Follow-up: Return in about 4 weeks (around 04/30/2017) for HTN and alcohol abuse .   Boykin Nearing MD

## 2017-04-02 NOTE — Assessment & Plan Note (Signed)
A: alcohol abuse, exacerbation with current intoxication  P: Checking LFTs, CBC, INR Adding multivitamin Internal referral to CSW, he declines AA CSW provided resources [facility] and [employer]

## 2017-04-02 NOTE — Patient Instructions (Addendum)
Moo was seen today for diabetes.  Diagnoses and all orders for this visit:  Uncontrolled type 2 diabetes mellitus without complication, without long-term current use of insulin (HCC) -     POCT glucose (manual entry) -     POCT glycosylated hemoglobin (Hb A1C) -     CMP14+EGFR -     Lipid Panel -     canagliflozin (INVOKANA) 100 MG TABS tablet; Take 1 tablet (100 mg total) by mouth daily before breakfast. -     aspirin EC 81 MG tablet; Take 1 tablet (81 mg total) by mouth daily. -     atorvastatin (LIPITOR) 40 MG tablet; Take 1 tablet (40 mg total) by mouth daily.  Alcohol abuse -     CBC -     Multiple Vitamins-Minerals (MULTIVITAMIN ADULT) TABS; Take 1 tablet by mouth daily. -     Magnesium -     Phosphorus -     Folate -     Vitamin B12 -     POCT INR  Essential hypertension -     losartan (COZAAR) 50 MG tablet; Take 1 tablet (50 mg total) by mouth daily.   Please contact [facility] for additional alcohol abuse treatment Start multivitamin to prevent malnutrition  Work to cut down from 12 to 6 beers per night   F/u in 4 weeks for HTN and alcohol use   Dr. Adrian Blackwater

## 2017-04-02 NOTE — Assessment & Plan Note (Signed)
A: improving with weight loss, however weight loss is most likely due to malnutrition from alcohol abuse P: Continue invokana Checking CMP Restart daily aspirin and statin

## 2017-04-03 ENCOUNTER — Other Ambulatory Visit: Payer: Self-pay | Admitting: Family Medicine

## 2017-04-03 DIAGNOSIS — F17200 Nicotine dependence, unspecified, uncomplicated: Secondary | ICD-10-CM

## 2017-04-03 LAB — VITAMIN B12: VITAMIN B 12: 610 pg/mL (ref 232–1245)

## 2017-04-03 LAB — CMP14+EGFR
ALK PHOS: 71 IU/L (ref 39–117)
ALT: 68 IU/L — AB (ref 0–44)
AST: 50 IU/L — ABNORMAL HIGH (ref 0–40)
Albumin/Globulin Ratio: 1.6 (ref 1.2–2.2)
Albumin: 4.9 g/dL (ref 3.5–5.5)
BUN/Creatinine Ratio: 14 (ref 9–20)
BUN: 10 mg/dL (ref 6–24)
Bilirubin Total: 1.1 mg/dL (ref 0.0–1.2)
CHLORIDE: 97 mmol/L (ref 96–106)
CO2: 24 mmol/L (ref 20–29)
CREATININE: 0.69 mg/dL — AB (ref 0.76–1.27)
Calcium: 9.7 mg/dL (ref 8.7–10.2)
GFR calc Af Amer: 124 mL/min/{1.73_m2} (ref >59)
GFR calc non Af Amer: 108 mL/min/{1.73_m2} (ref >59)
Globulin, Total: 3 g/dL (ref 1.5–4.5)
Glucose: 118 mg/dL — ABNORMAL HIGH (ref 65–99)
Potassium: 4.8 mmol/L (ref 3.5–5.2)
Sodium: 137 mmol/L (ref 134–144)
TOTAL PROTEIN: 7.9 g/dL (ref 6.0–8.5)

## 2017-04-03 LAB — CBC
HEMATOCRIT: 48.6 % (ref 37.5–51.0)
HEMOGLOBIN: 17.4 g/dL (ref 13.0–17.7)
MCH: 33.4 pg — ABNORMAL HIGH (ref 26.6–33.0)
MCHC: 35.8 g/dL — ABNORMAL HIGH (ref 31.5–35.7)
MCV: 93 fL (ref 79–97)
Platelets: 210 10*3/uL (ref 150–379)
RBC: 5.21 x10E6/uL (ref 4.14–5.80)
RDW: 13.2 % (ref 12.3–15.4)
WBC: 8.1 10*3/uL (ref 3.4–10.8)

## 2017-04-03 LAB — FOLATE

## 2017-04-03 LAB — LIPID PANEL
CHOLESTEROL TOTAL: 238 mg/dL — AB (ref 100–199)
Chol/HDL Ratio: 4.2 ratio (ref 0.0–5.0)
HDL: 57 mg/dL (ref >39)
LDL CALC: 157 mg/dL — AB (ref 0–99)
TRIGLYCERIDES: 119 mg/dL (ref 0–149)
VLDL Cholesterol Cal: 24 mg/dL (ref 5–40)

## 2017-04-03 LAB — PHOSPHORUS: Phosphorus: 3.1 mg/dL (ref 2.5–4.5)

## 2017-04-03 LAB — MAGNESIUM: Magnesium: 2.3 mg/dL (ref 1.6–2.3)

## 2017-04-04 MED ORDER — ALBUTEROL SULFATE HFA 108 (90 BASE) MCG/ACT IN AERS: 2.0000 | INHALATION_SPRAY | Freq: Four times a day (QID) | RESPIRATORY_TRACT | 0 refills | Status: DC | PRN

## 2017-04-15 ENCOUNTER — Telehealth: Payer: Self-pay

## 2017-04-15 NOTE — Telephone Encounter (Signed)
Pt was called and informed of lab results. 

## 2017-04-27 MED FILL — !VENTOLIN HFA INHALER: 108 (90 BAS | 25 days supply | Qty: 18 | Fill #0

## 2017-04-27 MED FILL — LOSARTAN POTASSIUM 50 MG TA: 50 | 30 days supply | Qty: 30 | Fill #1

## 2017-04-27 MED FILL — INVOKANA 100 MG TABLET: 100 | 30 days supply | Qty: 30 | Fill #1

## 2017-04-27 MED FILL — $TESTOSTERONE CYP 200MG/ML: 200 | 14 days supply | Qty: 1 | Fill #0

## 2017-04-27 MED FILL — ATORVASTATIN 40 MG TABLET: 40 | 30 days supply | Qty: 30 | Fill #1

## 2017-04-27 MED FILL — OMEPRAZOLE DR 20 MG CAPSULE: 20 | 30 days supply | Qty: 90 | Fill #1

## 2017-04-30 ENCOUNTER — Ambulatory Visit: Payer: Self-pay | Attending: Family Medicine | Admitting: Family Medicine

## 2017-04-30 ENCOUNTER — Encounter: Payer: Self-pay | Admitting: Family Medicine

## 2017-04-30 VITALS — BP 142/81 | HR 80 | Temp 98.7°F | Resp 18 | Ht 71.0 in | Wt 174.0 lb

## 2017-04-30 DIAGNOSIS — Z7982 Long term (current) use of aspirin: Secondary | ICD-10-CM | POA: Insufficient documentation

## 2017-04-30 DIAGNOSIS — M25512 Pain in left shoulder: Secondary | ICD-10-CM

## 2017-04-30 DIAGNOSIS — E1142 Type 2 diabetes mellitus with diabetic polyneuropathy: Secondary | ICD-10-CM

## 2017-04-30 DIAGNOSIS — IMO0001 Reserved for inherently not codable concepts without codable children: Secondary | ICD-10-CM

## 2017-04-30 DIAGNOSIS — F17210 Nicotine dependence, cigarettes, uncomplicated: Secondary | ICD-10-CM | POA: Insufficient documentation

## 2017-04-30 DIAGNOSIS — L03811 Cellulitis of head [any part, except face]: Secondary | ICD-10-CM | POA: Insufficient documentation

## 2017-04-30 DIAGNOSIS — E1165 Type 2 diabetes mellitus with hyperglycemia: Secondary | ICD-10-CM | POA: Insufficient documentation

## 2017-04-30 DIAGNOSIS — E785 Hyperlipidemia, unspecified: Secondary | ICD-10-CM | POA: Insufficient documentation

## 2017-04-30 DIAGNOSIS — S91109A Unspecified open wound of unspecified toe(s) without damage to nail, initial encounter: Secondary | ICD-10-CM

## 2017-04-30 DIAGNOSIS — Z7984 Long term (current) use of oral hypoglycemic drugs: Secondary | ICD-10-CM | POA: Insufficient documentation

## 2017-04-30 DIAGNOSIS — F1010 Alcohol abuse, uncomplicated: Secondary | ICD-10-CM | POA: Insufficient documentation

## 2017-04-30 DIAGNOSIS — I10 Essential (primary) hypertension: Secondary | ICD-10-CM | POA: Insufficient documentation

## 2017-04-30 LAB — GLUCOSE, POCT (MANUAL RESULT ENTRY): POC GLUCOSE: 174 mg/dL — AB (ref 70–99)

## 2017-04-30 MED ORDER — BACITRACIN 500 UNIT/GM EX OINT: 1.0000 "application " | TOPICAL_OINTMENT | Freq: Two times a day (BID) | CUTANEOUS | 0 refills | Status: DC

## 2017-04-30 MED ORDER — CEPHALEXIN 500 MG PO CAPS: 500.0000 mg | ORAL_CAPSULE | Freq: Four times a day (QID) | ORAL | 0 refills | Status: DC

## 2017-04-30 MED ORDER — IBUPROFEN 600 MG PO TABS
600.0000 mg | ORAL_TABLET | Freq: Three times a day (TID) | ORAL | 0 refills | Status: DC | PRN
Start: 2017-04-30 — End: 2017-07-08

## 2017-04-30 MED ORDER — PREGABALIN 100 MG PO CAPS: 100.0000 mg | ORAL_CAPSULE | Freq: Three times a day (TID) | ORAL | 3 refills | Status: DC

## 2017-04-30 MED FILL — IBUPROFEN 600 MG TABLET: 600 | 10 days supply | Qty: 30 | Fill #0

## 2017-04-30 MED FILL — CEPHALEXIN 500 MG CAPSULE: 500 | 7 days supply | Qty: 28 | Fill #0

## 2017-04-30 NOTE — Patient Instructions (Addendum)
Cellulitis, Adult Cellulitis is a skin infection. The infected area is usually red and sore. This condition occurs most often in the arms and lower legs. It is very important to get treated for this condition. Follow these instructions at home:  Take over-the-counter and prescription medicines only as told by your doctor.  If you were prescribed an antibiotic medicine, take it as told by your doctor. Do not stop taking the antibiotic even if you start to feel better.  Drink enough fluid to keep your pee (urine) clear or pale yellow.  Do not touch or rub the infected area.  Raise (elevate) the infected area above the level of your heart while you are sitting or lying down.  Place warm or cold wet cloths (warm or cold compresses) on the infected area. Do this as told by your doctor.  Keep all follow-up visits as told by your doctor. This is important. These visits let your doctor make sure your infection is not getting worse. Contact a doctor if:  You have a fever.  Your symptoms do not get better after 1-2 days of treatment.  Your bone or joint under the infected area starts to hurt after the skin has healed.  Your infection comes back. This can happen in the same area or another area.  You have a swollen bump in the infected area.  You have new symptoms.  You feel ill and also have muscle aches and pains. Get help right away if:  Your symptoms get worse.  You feel very sleepy.  You throw up (vomit) or have watery poop (diarrhea) for a long time.  There are red streaks coming from the infected area.  Your red area gets larger.  Your red area turns darker. This information is not intended to replace advice given to you by your health care provider. Make sure you discuss any questions you have with your health care provider. Document Released: 02/03/2008 Document Revised: 01/23/2016 Document Reviewed: 06/26/2015 Elsevier Interactive Patient Education  2018 [place] [employer] Joint pain can be caused by many things. The joint can be bruised, infected, weak from aging, or sore from exercise. The pain will probably go away if you follow your doctor's instructions for home care. If your joint pain continues, more tests may be needed to help find the cause of your condition. Follow these instructions at home: Watch your condition for any changes. Follow these instructions as told to lessen the pain that you are feeling:  Take medicines only as told by your doctor.  Rest the sore joint for as long as told by your doctor. If your doctor tells you to, raise (elevate) the painful joint above the level of your heart while you are sitting or lying down.  Do not do things that cause pain or make the pain worse.  If told, put ice on the painful area: ? Put ice in a plastic bag. ? Place a towel between your skin and the bag. ? Leave the ice on for 20 minutes, 2-3 times per day.  Wear an elastic bandage, splint, or sling as told by your doctor. Loosen the bandage or splint if your fingers or toes lose feeling (become numb) and tingle, or if they turn cold and blue.  Begin exercising or stretching the joint as told by your doctor. Ask your doctor what types of exercise are safe for you.  Keep all follow-up visits as told by your doctor. This is important.  Contact a doctor if:  Your pain gets worse and medicine does not help it.  Your joint pain does not get better in 3 days.  You have more bruising or swelling.  You have a fever.  You lose 10 pounds (4.5 kg) or more without trying. Get help right away if:  You are not able to move the joint.  Your fingers or toes become numb or they turn cold and blue. This information is not intended to replace advice given to you by your health care provider. Make sure you discuss any questions you have with your health care provider. Document Released: 08/05/2009 Document Revised: 01/23/2016 Document  Reviewed: 05/29/2014 Elsevier Interactive Patient Education  2018 [place].  Cellulitis, Adult Cellulitis is a skin infection. The infected area is usually red and sore. This condition occurs most often in the arms and lower legs. It is very important to get treated for this condition. Follow these instructions at home:  Take over-the-counter and prescription medicines only as told by your doctor.  If you were prescribed an antibiotic medicine, take it as told by your doctor. Do not stop taking the antibiotic even if you start to feel better.  Drink enough fluid to keep your pee (urine) clear or pale yellow.  Do not touch or rub the infected area.  Raise (elevate) the infected area above the level of your heart while you are sitting or lying down.  Place warm or cold wet cloths (warm or cold compresses) on the infected area. Do this as told by your doctor.  Keep all follow-up visits as told by your doctor. This is important. These visits let your doctor make sure your infection is not getting worse. Contact a doctor if:  You have a fever.  Your symptoms do not get better after 1-2 days of treatment.  Your bone or joint under the infected area starts to hurt after the skin has healed.  Your infection comes back. This can happen in the same area or another area.  You have a swollen bump in the infected area.  You have new symptoms.  You feel ill and also have muscle aches and pains. Get help right away if:  Your symptoms get worse.  You feel very sleepy.  You throw up (vomit) or have watery poop (diarrhea) for a long time.  There are red streaks coming from the infected area.  Your red area gets larger.  Your red area turns darker. This information is not intended to replace advice given to you by your health care provider. Make sure you discuss any questions you have with your health care provider. Document Released: 02/03/2008 Document Revised: 01/23/2016  Document Reviewed: 06/26/2015 Elsevier Interactive Patient Education  2018 [employer].

## 2017-04-30 NOTE — Progress Notes (Signed)
Subjective:  Patient ID: [name] [name], male    DOB: [birth year]  Age: 56 y.o. MRN: [id number]  CC: Establish Care   HPI KALEEL SCHMIEDER presents for follow up of DM, HTN.   Patient denies foot ulcerations, nausea, paresthesia of the feet, polydipsia, polyuria, visual disturbances, vomitting and weight loss.  Evaluation to date has been included: fasting blood sugar and hemoglobin A1C.  Home sugars: patient does not check sugars. Treatment to date: invokana. History of HTN. He is not exercising and is not adherent to low salt diet.  He does not check BP at home. He reports not taking BP medications prior to office visit. Cardiac symptoms none. Patient denies chest pain, chest pressure/discomfort, claudication, dyspnea, near-syncope and syncope.  Cardiovascular risk factors: diabetes mellitus, dyslipidemia, hypertension, male gender, sedentary lifestyle and smoking/ tobacco exposure. Current smoker 1/2 pack per week. He is not ready to quit at this time. Use of agents associated with hypertension: none. History of target organ damage: none. Patient complains of arthralgias for which has been present for 2 months. Pain is located in the left shoulder(s), is described as aching, and is intermittent . Pain 5/10.   Associated symptoms include: none.  The patient has tried nothing for pain relief.  Related to injury:  History of rotator cuff injury. .Patient c/o skin. lesion is located in the scalp. Onset was several weeks ago. Associated symptoms tenderness/ redness. Denies any  of spontaneous drainage or fever. Patient does not have previous history of cutaneous abscesses. History of alcohol use. Reports drinking 4 to 6 beers daily, reduced intake down from 12 beers daily. He reports receiving services from [facility]. He declines speaking with LCSW at this time.   Outpatient Medications Prior to Visit  Medication Sig Dispense Refill  . albuterol (VENTOLIN HFA) 108 (90 Base) MCG/ACT inhaler Inhale 2 puffs  into the lungs every 6 (six) hours as needed for wheezing or shortness of breath. 18 each 0  . aspirin EC 81 MG tablet Take 1 tablet (81 mg total) by mouth daily. 30 tablet 11  . atorvastatin (LIPITOR) 40 MG tablet Take 1 tablet (40 mg total) by mouth daily. 30 tablet 11  . Blood Glucose Monitoring Suppl (TRUE METRIX METER) w/Device KIT 1 each by Does not apply route as needed. 1 kit 0  . canagliflozin (INVOKANA) 100 MG TABS tablet Take 1 tablet (100 mg total) by mouth daily before breakfast. 30 tablet 11  . citalopram (CELEXA) 20 MG tablet Take 20 mg by mouth daily.    Marland Kitchen glucose blood (TRUE METRIX BLOOD GLUCOSE TEST) test strip 1 each by Other route 3 (three) times daily. 100 each 11  . losartan (COZAAR) 50 MG tablet Take 1 tablet (50 mg total) by mouth daily. 90 tablet 3  . Multiple Vitamins-Minerals (MULTIVITAMIN ADULT) TABS Take 1 tablet by mouth daily. 30 tablet 11  . omeprazole (PRILOSEC) 20 MG capsule TAKE ONE CAPSULE BY MOUTH DAILY 90 capsule 3  . testosterone cypionate (DEPOTESTOSTERONE CYPIONATE) 200 MG/ML injection Inject 1 mL (200 mg total) into the muscle every 14 (fourteen) days. For PASS 1 mL 5  . TRUEPLUS LANCETS 28G MISC 1 each by Does not apply route 3 (three) times daily. 100 each 11  . zolpidem (AMBIEN) 10 MG tablet Take 10 mg by mouth at bedtime as needed for sleep. At 2000    . pregabalin (LYRICA) 100 MG capsule Take 1 capsule (100 mg total) by mouth 3 (three) times daily. (Patient not taking:  Reported on 04/02/2017) 270 capsule 3   No facility-administered medications prior to visit.     ROS Review of Systems  Constitutional: Negative.   Eyes: Negative.   Respiratory: Negative.   Cardiovascular: Negative.   Gastrointestinal: Negative.   Genitourinary: Negative.   Musculoskeletal: Positive for arthralgias.  Skin:       Scalp lesion   Neurological: Negative.   Psychiatric/Behavioral: Positive for behavioral problems (history of alcoholism).    Objective:  BP (!)  142/81 (BP Location: Left Arm, Patient Position: Sitting, Cuff Size: Normal)   Pulse 80   Temp 98.7 F (37.1 C) (Oral)   Resp 18   Ht '5\' 11"'  (1.803 m)   Wt 174 lb (78.9 kg)   SpO2 96%   BMI 24.27 kg/m   BP/Weight 05/04/2017 7/51/0258 12/30/7780  Systolic BP 423 536 144  Diastolic BP 78 81 92  Wt. (Lbs) 174 174 174.8  BMI 24.27 24.27 24.38     Physical Exam  Constitutional: He appears well-developed and well-nourished.  Eyes: Pupils are equal, round, and reactive to light. Conjunctivae are normal.  Neck: Normal range of motion. Neck supple. No JVD present.  Cardiovascular: Normal rate, regular rhythm, normal heart sounds and intact distal pulses.   Pulmonary/Chest: Effort normal and breath sounds normal.  Abdominal: Soft. Bowel sounds are normal. There is no tenderness.  Musculoskeletal:       Left shoulder: He exhibits pain.  Skin: Skin is warm and dry. Lesion (to occipital scalp; no drainage present. redness and tenderness.) noted.  Nursing note and vitals reviewed.     Diabetic Foot Exam - Simple   Simple Foot Form Diabetic Foot exam was performed with the following findings:  Yes 04/30/2017  8:44 AM  Visual Inspection No deformities, no ulcerations, no other skin breakdown bilaterally:  Yes Sensation Testing See comments:  Yes Pulse Check Posterior Tibialis and Dorsalis pulse intact bilaterally:  Yes Comments Sensation decreased bilateral plantar surface at mtp.       Assessment & Plan:   Problem List Items Addressed This Visit      Cardiovascular and Mediastinum   Essential hypertension (Chronic)    Follow up in 2 weeks for BP.    Endocrine   Uncontrolled type 2 diabetes mellitus without complication, without long-term current use of insulin ([place]) - Primary (Chronic)   Start check CBG QD and bring glucometer o log  to next office visit .    Relevant Orders   Glucose (CBG) (Completed)   Ambulatory referral to Podiatry     Other   Alcohol abuse (Chronic)         Encourage abstinence from alcohol.        Declines speaking with LCSW at this time for additional resources.     Other Visit Diagnoses    Pain in joint of left shoulder       Relevant Medications   ibuprofen (ADVIL,MOTRIN) 600 MG tablet   Avulsion of skin of toe, initial encounter       Relevant Orders   Ambulatory referral to Podiatry   Cellulitis of occipital region of scalp       Relevant Medications   cephALEXin (KEFLEX) 500 MG capsule   bacitracin 500 UNIT/GM ointment   Diabetic peripheral neuropathy associated with type 2 diabetes mellitus ([place])       Relevant Orders   Ambulatory referral to Podiatry      Meds ordered this encounter  Medications  . ibuprofen (ADVIL,MOTRIN) 600 MG tablet  Sig: Take 1 tablet (600 mg total) by mouth every 8 (eight) hours as needed for moderate pain or cramping (Take with food).    Dispense:  30 tablet    Refill:  0    Order Specific Question:   Supervising Provider    Answer:   Tresa Garter [id number]  . cephALEXin (KEFLEX) 500 MG capsule    Sig: Take 1 capsule (500 mg total) by mouth 4 (four) times daily.    Dispense:  28 capsule    Refill:  0    Order Specific Question:   Supervising Provider    Answer:   Tresa Garter [id number]  . bacitracin 500 UNIT/GM ointment    Sig: Apply 1 application topically 2 (two) times daily. For 7 days.    Dispense:  15 g    Refill:  0    Order Specific Question:   Supervising Provider    Answer:   Tresa Garter [id number]  . DISCONTD: pregabalin (LYRICA) 100 MG capsule    Sig: Take 1 capsule (100 mg total) by mouth 3 (three) times daily.    Dispense:  270 capsule    Refill:  3    Order Specific Question:   Supervising Provider    Answer:   Tresa Garter [id number]    Follow-up: Return in about 2 weeks (around 05/14/2017) for Cellultis / BP check .   Alfonse Spruce FNP

## 2017-04-30 NOTE — Progress Notes (Signed)
Patient is here for establish care   Patient has eaten for today   Shoulder aching

## 2017-05-04 ENCOUNTER — Ambulatory Visit (INDEPENDENT_AMBULATORY_CARE_PROVIDER_SITE_OTHER): Payer: Self-pay | Admitting: Gastroenterology

## 2017-05-04 ENCOUNTER — Other Ambulatory Visit (INDEPENDENT_AMBULATORY_CARE_PROVIDER_SITE_OTHER): Payer: Self-pay

## 2017-05-04 ENCOUNTER — Encounter: Payer: Self-pay | Admitting: Gastroenterology

## 2017-05-04 VITALS — BP 178/78 | HR 88 | Ht 71.0 in | Wt 174.0 lb

## 2017-05-04 DIAGNOSIS — F1010 Alcohol abuse, uncomplicated: Secondary | ICD-10-CM

## 2017-05-04 DIAGNOSIS — Z8619 Personal history of other infectious and parasitic diseases: Secondary | ICD-10-CM

## 2017-05-04 DIAGNOSIS — Z86010 Personal history of colonic polyps: Secondary | ICD-10-CM

## 2017-05-04 DIAGNOSIS — K2270 Barrett's esophagus without dysplasia: Secondary | ICD-10-CM

## 2017-05-04 DIAGNOSIS — R7401 Elevation of levels of liver transaminase levels: Secondary | ICD-10-CM

## 2017-05-04 DIAGNOSIS — R740 Nonspecific elevation of levels of transaminase and lactic acid dehydrogenase [LDH]: Secondary | ICD-10-CM

## 2017-05-04 DIAGNOSIS — F1011 Alcohol abuse, in remission: Secondary | ICD-10-CM

## 2017-05-04 DIAGNOSIS — Z87898 Personal history of other specified conditions: Secondary | ICD-10-CM

## 2017-05-04 LAB — FERRITIN: Ferritin: 167.4 ng/mL (ref 22.0–322.0)

## 2017-05-04 NOTE — Progress Notes (Signed)
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   Since the last visit he had been placed on omeprazole 31m once daily. He doing well on this regimen. No dysphagia, eating well. Heartburn is well controlled on this regimen, states he is feeling well.   He is due for a surveillance colonoscopy at this time. 2cm TVA removed in 01/2014, that procedure was complicated by postpolypectomy bleeding. He denies any changes in bowel habits. No blood in the stools. No FH of colon cancer.   History of Hepatitis C - treated with negative viral load. He has history of possible cirrhosis of the liver based on elastography of F4 done in 2016. Platelets and INR have been normal. He admits to drinking 6 beers / day. Ongoing for several years now. He has completed hep A and B vaccination series in 2016. He has been able to quit drinking in the past. Prior ANA negative. He was supposed to have a follow up [place]oreadone last year and did not follow up for it. ALT remains elevated.  Prior workup: Colonoscopy 02/16/2014 - cecal polypectomy site treated with clips Colonoscopy 02/13/2014 - 2cm cecal polyp removed via EMR - tubulovillous adenoma EGD 11/29/14 - LA grade B esophagitis, suspected Barrett's 2cm length, small hiatal hernia - biopsies c/w Barrett's Gastric emptying study 12/17/14 - normal  UKorea4/2016 - Fibrosis F3/F4, 377mgallbladder polyp, steatosis    Past Medical History:  Diagnosis Date  . Barrett's esophagus dx 2016  . Bronchitis   . Depression   . Diabetes ([place]  . ED (erectile dysfunction)   . GERD (gastroesophageal reflux disease)   . Hepatitis C   . Neuropathy      Past Surgical History:  Procedure Laterality Date  . APPENDECTOMY    . COLONOSCOPY N/A 02/16/2014   Procedure: COLONOSCOPY;  Surgeon: CaGatha MayerMD;  Location: [facility];  Service: Endoscopy;  Laterality: N/A;  .  COLONOSCOPY    . TONSILLECTOMY     Family History  Problem Relation Age of Onset  . Cancer Mother   . Diabetes Mother   . Heart disease Maternal Grandfather   . Pancreatic cancer Paternal Grandmother   . Colon cancer Neg Hx    Social History  Substance Use Topics  . Smoking status: Current Every Day Smoker    Packs/day: 0.50    Years: 35.00    Last attempt to quit: 03/26/2015  . Smokeless tobacco: Never Used  . Alcohol use 12.6 oz/week    21 Standard drinks or equivalent per week     Comment: 21 beers a week   Current Outpatient Prescriptions  Medication Sig Dispense Refill  . albuterol (VENTOLIN HFA) 108 (90 Base) MCG/ACT inhaler Inhale 2 puffs into the lungs every 6 (six) hours as needed for wheezing or shortness of breath. 18 each 0  . aspirin EC 81 MG tablet Take 1 tablet (81 mg total) by mouth daily. 30 tablet 11  . atorvastatin (LIPITOR) 40 MG tablet Take 1 tablet (40 mg total) by mouth daily. 30 tablet 11  . bacitracin 500 UNIT/GM ointment Apply 1 application topically 2 (two) times daily. For 7 days. 15 g 0  . Blood Glucose Monitoring Suppl (TRUE METRIX METER) w/Device KIT 1 each by Does not apply route as needed. 1 kit 0  . canagliflozin (INVOKANA) 100 MG TABS tablet Take  1 tablet (100 mg total) by mouth daily before breakfast. 30 tablet 11  . cephALEXin (KEFLEX) 500 MG capsule Take 1 capsule (500 mg total) by mouth 4 (four) times daily. 28 capsule 0  . citalopram (CELEXA) 20 MG tablet Take 20 mg by mouth daily.    Marland Kitchen glucose blood (TRUE METRIX BLOOD GLUCOSE TEST) test strip 1 each by Other route 3 (three) times daily. 100 each 11  . ibuprofen (ADVIL,MOTRIN) 600 MG tablet Take 1 tablet (600 mg total) by mouth every 8 (eight) hours as needed for moderate pain or cramping (Take with food). 30 tablet 0  . losartan (COZAAR) 50 MG tablet Take 1 tablet (50 mg total) by mouth daily. 90 tablet 3  . Multiple Vitamins-Minerals (MULTIVITAMIN ADULT) TABS Take 1 tablet by mouth daily. 30  tablet 11  . omeprazole (PRILOSEC) 20 MG capsule TAKE ONE CAPSULE BY MOUTH DAILY 90 capsule 3  . testosterone cypionate (DEPOTESTOSTERONE CYPIONATE) 200 MG/ML injection Inject 1 mL (200 mg total) into the muscle every 14 (fourteen) days. For PASS 1 mL 5  . TRUEPLUS LANCETS 28G MISC 1 each by Does not apply route 3 (three) times daily. 100 each 11  . zolpidem (AMBIEN) 10 MG tablet Take 10 mg by mouth at bedtime as needed for sleep. At 2000     No current facility-administered medications for this visit.    No Known Allergies   Review of Systems: All systems reviewed and negative except where noted in HPI.   Lab Results  Component Value Date   WBC 8.1 04/02/2017   HGB 17.4 04/02/2017   HCT 48.6 04/02/2017   MCV 93 04/02/2017   PLT 210 04/02/2017    Lab Results  Component Value Date   CREATININE 0.69 (L) 04/02/2017   BUN 10 04/02/2017   NA 137 04/02/2017   K 4.8 04/02/2017   CL 97 04/02/2017   CO2 24 04/02/2017    Lab Results  Component Value Date   ALT 68 (H) 04/02/2017   AST 50 (H) 04/02/2017   ALKPHOS 71 04/02/2017   BILITOT 1.1 04/02/2017     Physical Exam: BP (!) 178/78   Pulse 88   Ht _0  (1.803 m)   Wt 174 lb (78.9 kg)   BMI 24.27 kg/m  Constitutional: Pleasant,well-developed, male in no acute distress. HEENT: Normocephalic and atraumatic. Conjunctivae are normal. No scleral icterus. Neck supple.  Cardiovascular: Normal rate, regular rhythm.  Pulmonary/chest: Effort normal and breath sounds normal. No wheezing, rales or rhonchi. Abdominal: Soft, nondistended, nontender. There are no masses palpable. ? hepatomegaly Extremities: no edema Lymphadenopathy: No cervical adenopathy noted. Neurological: Alert and oriented to person place and time. Skin: Skin is warm and dry. No rashes noted. Psychiatric: Normal mood and affect. Behavior is normal.   ASSESSMENT AND PLAN: 56 year old male here for reassessment of the following issues:  Elevated ALT /  history hepatitis C / alcohol abuse - hepatitis C has been eradicated, concern for fibrotic changes on prior elastography, however he has no clinical evidence of cirrhosis at this time, his platelets and INR count are normal. I had asked him to have a follow-up ultrasound last year which he failed to do, he is agreeable at this time to proceed with an ultrasound, rule out cirrhosis. His ALT is otherwise elevated, I suspect from ongoing alcohol abuse which we discussed at length. Recommend complete cessation of alcohol given his risk for cirrhosis. He said he would try to work on this but denied receiving  assistance. I will check AFP, iron studies, auto immune serologies to ensure negative otherwise.   Barrett's esophagus - short segment of nondysplastic Barrett's. We discussed with this is potential long-term risks. He is taking omeprazole 20 g once daily which is controlling his reflux quite well. Plan for recall EGD to be done in 3 years from his last exam.  History of TVA of the colon - due for surveillance colonoscopy at this time. Discussed risks and benefits of endoscopy and anesthesia with him, he wished to proceed. His last procedure was complicated by bleeding, we'll make every effort to minimize his risk of bleeding during this exam..    Cellar, MD [facility] Pager [phone]

## 2017-05-04 NOTE — Patient Instructions (Signed)
You have been scheduled for a colonoscopy. Please follow written instructions given to you at your visit today.  Please pick up your prep supplies at the pharmacy within the next 1-3 days. If you use inhalers (even only as needed), please bring them with you on the day of your procedure. Your physician has requested that you go to www.startemmi.com and enter the access code given to you at your visit today. This web site gives a general overview about your procedure. However, you should still follow specific instructions given to you by our office regarding your preparation for the procedure.  Your physician has requested that you go to the basement for the following lab work before leaving today: AFP, TIBC, ferritin, IGG, SMA  You have been scheduled for an abdominal ultrasound at [facility] (1st floor of hospital) on Friday 05/07/17 at 11:30 am. Please arrive 15 minutes prior to your appointment for registration. Make certain not to have anything to eat or drink 6 hours prior to your appointment. Should you need to reschedule your appointment, please contact radiology at [phone]. This test typically takes about 30 minutes to perform.  You will be due for a recall endoscopy in 10/2017. We will send you a reminder in the mail when it gets closer to that time.  If you are age 10 or older, your body mass index should be between 23-30. Your Body mass index is 24.27 kg/m. If this is out of the aforementioned range listed, please consider follow up with your Primary Care Provider.  If you are age 59 or younger, your body mass index should be between 19-25. Your Body mass index is 24.27 kg/m. If this is out of the aformentioned range listed, please consider follow up with your Primary Care Provider.

## 2017-05-05 LAB — IRON AND TIBC
%SAT: 65 % — ABNORMAL HIGH (ref 15–60)
Iron: 225 ug/dL — ABNORMAL HIGH (ref 50–180)
TIBC: 348 ug/dL (ref 250–425)
UIBC: 123 ug/dL

## 2017-05-05 LAB — AFP TUMOR MARKER: AFP TUMOR MARKER: 6.3 ng/mL — AB (ref <6.1)

## 2017-05-05 LAB — IGG: IgG (Immunoglobin G), Serum: 1455 mg/dL (ref 694–1618)

## 2017-05-07 ENCOUNTER — Ambulatory Visit (HOSPITAL_COMMUNITY)
Admission: RE | Admit: 2017-05-07 | Discharge: 2017-05-07 | Disposition: A | Discharge status: Home / Self Care | Payer: Self-pay | Source: Ambulatory Visit | Attending: Gastroenterology | Admitting: Gastroenterology

## 2017-05-07 DIAGNOSIS — R932 Abnormal findings on diagnostic imaging of liver and biliary tract: Secondary | ICD-10-CM | POA: Insufficient documentation

## 2017-05-07 DIAGNOSIS — R7401 Elevation of levels of liver transaminase levels: Secondary | ICD-10-CM

## 2017-05-07 DIAGNOSIS — R740 Nonspecific elevation of levels of transaminase and lactic acid dehydrogenase [LDH]: Secondary | ICD-10-CM | POA: Insufficient documentation

## 2017-05-07 DIAGNOSIS — F1010 Alcohol abuse, uncomplicated: Secondary | ICD-10-CM | POA: Insufficient documentation

## 2017-05-07 DIAGNOSIS — Z8619 Personal history of other infectious and parasitic diseases: Secondary | ICD-10-CM | POA: Insufficient documentation

## 2017-05-07 LAB — ANTI-SMOOTH MUSCLE ANTIBODY, IGG: Smooth Muscle Ab: 20 U — ABNORMAL HIGH (ref <20)

## 2017-05-10 ENCOUNTER — Telehealth: Payer: Self-pay | Admitting: Family Medicine

## 2017-05-10 NOTE — Telephone Encounter (Signed)
[name] [name] pts' dentist called requesting to speak to PCP regarding pt . 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 Please f/up Cell after hours # [phone] or office [phone]

## 2017-05-11 ENCOUNTER — Other Ambulatory Visit: Payer: Self-pay

## 2017-05-11 DIAGNOSIS — R790 Abnormal level of blood mineral: Secondary | ICD-10-CM

## 2017-05-11 DIAGNOSIS — R748 Abnormal levels of other serum enzymes: Secondary | ICD-10-CM

## 2017-05-11 DIAGNOSIS — R7401 Elevation of levels of liver transaminase levels: Secondary | ICD-10-CM

## 2017-05-11 DIAGNOSIS — R740 Nonspecific elevation of levels of transaminase and lactic acid dehydrogenase [LDH]: Principal | ICD-10-CM

## 2017-05-11 NOTE — Telephone Encounter (Signed)
Aldona Lento pts' dentist called requesting to speak to PCP regarding pt . 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 Please f/up Cell after hours # [phone] or office [phone]

## 2017-05-18 ENCOUNTER — Ambulatory Visit: Payer: Self-pay | Admitting: Family Medicine

## 2017-05-19 ENCOUNTER — Ambulatory Visit: Payer: Self-pay | Admitting: Family Medicine

## 2017-05-19 ENCOUNTER — Other Ambulatory Visit: Payer: Self-pay | Admitting: Family Medicine

## 2017-05-19 ENCOUNTER — Telehealth: Payer: Self-pay | Admitting: Hematology

## 2017-05-19 NOTE — Telephone Encounter (Signed)
Called and left message with Gordy Levan, DDS.

## 2017-05-19 NOTE — Telephone Encounter (Signed)
Called and left message with Dr. Gordy Levan. Please have patient to schedule follow up appointment to address concerns.

## 2017-05-19 NOTE — Telephone Encounter (Signed)
CMA call regarding to let him now that he needs to f/up with his PCP due to his sleep apnea due to hsi dental damage   Patient was aware and understood he has un upcoming appt on 09/20 with the pharmacist and he going to make that appt tomorrow while he is here

## 2017-05-19 NOTE — Telephone Encounter (Signed)
Pt has been scheduled to see Dr. Burr Medico on 9/20 at 11am. Pt aware to arrive 30 minutes early.

## 2017-05-20 ENCOUNTER — Ambulatory Visit (HOSPITAL_BASED_OUTPATIENT_CLINIC_OR_DEPARTMENT_OTHER): Payer: Self-pay

## 2017-05-20 ENCOUNTER — Ambulatory Visit (HOSPITAL_BASED_OUTPATIENT_CLINIC_OR_DEPARTMENT_OTHER): Payer: Self-pay | Admitting: Hematology

## 2017-05-20 ENCOUNTER — Encounter: Payer: Self-pay | Admitting: Hematology

## 2017-05-20 ENCOUNTER — Telehealth: Payer: Self-pay | Admitting: Hematology

## 2017-05-20 ENCOUNTER — Ambulatory Visit: Payer: Self-pay | Attending: Family Medicine

## 2017-05-20 VITALS — BP 149/72 | HR 82 | Temp 99.6°F | Resp 20 | Ht 71.0 in | Wt 176.7 lb

## 2017-05-20 DIAGNOSIS — K7460 Unspecified cirrhosis of liver: Secondary | ICD-10-CM

## 2017-05-20 DIAGNOSIS — R790 Abnormal level of blood mineral: Secondary | ICD-10-CM

## 2017-05-20 DIAGNOSIS — B1920 Unspecified viral hepatitis C without hepatic coma: Secondary | ICD-10-CM

## 2017-05-20 LAB — IRON AND TIBC
%SAT: 32 % (ref 20–55)
Iron: 109 ug/dL (ref 42–163)
TIBC: 336 ug/dL (ref 202–409)
UIBC: 227 ug/dL (ref 117–376)

## 2017-05-20 NOTE — Progress Notes (Addendum)
[place]  [phone] [phone]  Clinic New Consult Note   Patient Care Team: Alfonse Spruce, FNP as PCP - General (Family Medicine) Armbruster, Carlota Raspberry, MD as Consulting Physician (Gastroenterology) 05/20/2017  Referring physician: Dr. Havery Moros  CHIEF COMPLAINTS/PURPOSE OF CONSULTATION:  Rule out hemachromatosis   HISTORY OF PRESENTING ILLNESS:  [name] [name] 56 y.o. male is here because of possible hemachromatosis. He was referred by Dr. Havery Moros when he found iron sat to be elevated at 65% during work up for elevated transaminases on 04/02/2017. The patient has history of hepatitis C in 2016 treated with Harvoni with eradication per Dr. Novella Olive. He does not know how he was infected with HCV. He received Hep A and Hep B vaccines in 2016. He has not had a blood transfusion. No IV drug use. He denies family history of liver disease or hemachromatosis. He takes a multivitamin but does not know the iron content. He eats a regular diet heavy in red meat. He drinks 6 beers per day x17 years, no wine or liquor. He smokes 1.5 PPD x35 years.   Today he reports ongoing fatigue and decreased motivation that began when he was diagnosed with depression years ago. He has chronic stable diabetic peripheral neuropathy to his feet. He has bilateral shoulder arthralgia related to rotator cuff tears. He denies weakness, lethargy, headaches, nausea, vomiting, constipation, blood in stool, cough, chest pain, shortness of breath. She is up to date on colonoscopy and endoscopy for barrett's esophagus.   MEDICAL HISTORY:  Past Medical History:  Diagnosis Date  . Barrett's esophagus dx 2016  . Bronchitis   . Depression   . Diabetes ([place])   . ED (erectile dysfunction)   . GERD (gastroesophageal reflux disease)   . Hepatitis C   . Neuropathy    SURGICAL HISTORY: Past Surgical History:  Procedure Laterality Date  . APPENDECTOMY    . COLONOSCOPY N/A  02/16/2014   Procedure: COLONOSCOPY;  Surgeon: Gatha Mayer, MD;  Location: [facility];  Service: Endoscopy;  Laterality: N/A;  . COLONOSCOPY    . TONSILLECTOMY     SOCIAL HISTORY: Social History   Social History  . Marital status: Single    Spouse name: N/A  . Number of children: N/A  . Years of education: N/A   Occupational History  . Not on file.   Social History Main Topics  . Smoking status: Current Every Day Smoker    Packs/day: 1.50    Years: 35.00    Types: Cigarettes    Last attempt to quit: 03/26/2015  . Smokeless tobacco: Never Used  . Alcohol use 16.2 oz/week    21 Standard drinks or equivalent, 6 Cans of beer per week     Comment: 6 beers per day x17 years (2001)  . Drug use: No  . Sexual activity: Not on file   Other Topics Concern  . Not on file   Social History Narrative  . No narrative on file   FAMILY HISTORY: Family History  Problem Relation Age of Onset  . Cancer Mother        breast  . Diabetes Mother   . Heart disease Maternal Grandfather   . Pancreatic cancer Paternal Grandmother   . Stroke Father   . Colon cancer Neg Hx   Negative family history of hemochromatosis or liver disease.  ALLERGIES:  has No Known Allergies.  MEDICATIONS:  Current Outpatient Prescriptions  Medication Sig Dispense Refill  . albuterol (VENTOLIN HFA)  108 (90 Base) MCG/ACT inhaler Inhale 2 puffs into the lungs every 6 (six) hours as needed for wheezing or shortness of breath. 18 each 0  . aspirin EC 81 MG tablet Take 1 tablet (81 mg total) by mouth daily. 30 tablet 11  . atorvastatin (LIPITOR) 40 MG tablet Take 1 tablet (40 mg total) by mouth daily. 30 tablet 11  . bacitracin 500 UNIT/GM ointment Apply 1 application topically 2 (two) times daily. For 7 days. 15 g 0  . Blood Glucose Monitoring Suppl (TRUE METRIX METER) w/Device KIT 1 each by Does not apply route as needed. 1 kit 0  . canagliflozin (INVOKANA) 100 MG TABS tablet Take 1 tablet (100 mg total) by  mouth daily before breakfast. 30 tablet 11  . citalopram (CELEXA) 20 MG tablet Take 20 mg by mouth daily.    Marland Kitchen glucose blood (TRUE METRIX BLOOD GLUCOSE TEST) test strip 1 each by Other route 3 (three) times daily. 100 each 11  . ibuprofen (ADVIL,MOTRIN) 600 MG tablet Take 1 tablet (600 mg total) by mouth every 8 (eight) hours as needed for moderate pain or cramping (Take with food). 30 tablet 0  . losartan (COZAAR) 50 MG tablet Take 1 tablet (50 mg total) by mouth daily. 90 tablet 3  . Multiple Vitamins-Minerals (MULTIVITAMIN ADULT) TABS Take 1 tablet by mouth daily. 30 tablet 11  . omeprazole (PRILOSEC) 20 MG capsule TAKE ONE CAPSULE BY MOUTH DAILY 90 capsule 3  . testosterone cypionate (DEPOTESTOSTERONE CYPIONATE) 200 MG/ML injection Inject 1 mL (200 mg total) into the muscle every 14 (fourteen) days. For PASS 1 mL 5  . TRUEPLUS LANCETS 28G MISC 1 each by Does not apply route 3 (three) times daily. 100 each 11  . zolpidem (AMBIEN) 10 MG tablet Take 10 mg by mouth at bedtime as needed for sleep. At 2000     No current facility-administered medications for this visit.    REVIEW OF SYSTEMS:   Constitutional: Denies fevers, chills or abnormal night sweats Eyes: Denies blurriness of vision, double vision or watery eyes Ears, nose, mouth, throat, and face: Denies mucositis or sore throat Respiratory: Denies cough, dyspnea or wheezes Cardiovascular: Denies palpitation, chest discomfort or lower extremity swelling Gastrointestinal:  Denies nausea, heartburn or change in bowel habits (+) intermittent diarrhea (+) intermittent mild LLQ discomfort  Skin: Denies abnormal skin rashes Lymphatics: Denies new lymphadenopathy or easy bruising Neurological: (+)neuropathy in hands/feet Behavioral/Psych: Mood is stable, no new changes  All other systems were reviewed with the patient and are negative.  PHYSICAL EXAMINATION: ECOG PERFORMANCE STATUS: 0 - Asymptomatic  Vitals:   05/20/17 1103  BP: (!)  149/72  Pulse: 82  Resp: 20  Temp: 99.6 F (37.6 C)  SpO2: 98%   Filed Weights   05/20/17 1103  Weight: 176 lb 11.2 oz (80.2 kg)   GENERAL:alert, no distress and comfortable SKIN: skin color, texture, turgor are normal, no rashes or significant lesions EYES: normal, conjunctiva are pink and non-injected, sclera clear OROPHARYNX:no exudate, no erythema and lips, buccal mucosa, and tongue normal  NECK: supple, thyroid normal size, non-tender, without nodularity LYMPH:  no palpable cervical or supraclavicular lymphadenopathy  LUNGS: clear to auscultation bilaterally with normal breathing effort HEART: regular rate & rhythm and no murmurs and no lower extremity edema ABDOMEN:abdomen soft, round, non-tender and normal bowel sounds. No palpable masses. No ascites  Musculoskeletal:no cyanosis of digits and no clubbing  PSYCH: alert & oriented x 3 with fluent speech NEURO: no focal motor  deficits  LABORATORY DATA:  I have reviewed the data as listed CBC Latest Ref Rng & Units 04/02/2017 02/18/2016 01/23/2016  WBC 3.4 - 10.8 x10E3/uL 8.1 8.4 12.3(H)  Hemoglobin 13.0 - 17.7 g/dL 17.4 15.2 15.4  Hematocrit 37.5 - 51.0 % 48.6 44.7 44.9  Platelets 150 - 379 x10E3/uL 210 192 235.0   CMP Latest Ref Rng & Units 04/02/2017 08/18/2016 01/02/2016  Glucose 65 - 99 mg/dL 118(H) 132(H) 153(H)  BUN 6 - 24 mg/dL '10 16 15  ' Creatinine 0.76 - 1.27 mg/dL 0.69(L) 0.68(L) 0.87  Sodium 134 - 144 mmol/L 137 138 136  Potassium 3.5 - 5.2 mmol/L 4.8 4.5 4.1  Chloride 96 - 106 mmol/L 97 104 102  CO2 20 - 29 mmol/L '24 25 25  ' Calcium 8.7 - 10.2 mg/dL 9.7 9.4 9.2  Total Protein 6.0 - 8.5 g/dL 7.9 7.5 6.6  Total Bilirubin 0.0 - 1.2 mg/dL 1.1 0.6 0.4  Alkaline Phos 39 - 117 IU/L 71 56 61  AST 0 - 40 IU/L 50(H) 36(H) 26  ALT 0 - 44 IU/L 68(H) 46 38   RADIOGRAPHIC STUDIES: I have personally reviewed the radiological images as listed and agreed with the findings in the report. US Abdomen Complete  Result Date:  05/07/2017 CLINICAL DATA:  Elevated liver enzymes.  History of hepatitis-C EXAM: ABDOMEN ULTRASOUND COMPLETE COMPARISON:  November 21, 2014 FINDINGS: Gallbladder: No gallstones or wall thickening visualized. There is no pericholecystic fluid. Note that the previous 3 mm non moving echogenic focus described is not evident on the current examination. No sonographic Murphy sign noted by sonographer. Common bile duct: Diameter: 5 mm. No intrahepatic, common hepatic, or common bile duct dilatation. Liver: No focal lesion identified. Liver echogenicity is increased diffusely. Portal vein is patent on color Doppler imaging with normal direction of blood flow towards the liver. IVC: No abnormality visualized. Pancreas: Visualized portion unremarkable. There are portions of the pancreas which are obscured by gas. Spleen: Size and appearance within normal limits. Right Kidney: Length: 12.0 cm. Echogenicity within normal limits. No mass or hydronephrosis visualized. Left Kidney: Length: 12.6 cm. Echogenicity within normal limits. No mass or hydronephrosis visualized. Abdominal aorta: No aneurysm visualized. Other findings: No demonstrable ascites. IMPRESSION: 1. Increased liver echogenicity, a finding indicative of hepatic steatosis and/or underlying parenchymal liver disease. While no focal liver lesions are evident on this study, it must be cautioned that the sensitivity of ultrasound for detection of focal liver lesions is diminished in this circumstance. 2. Portions of pancreas obscured by gas. Visualized portions of pancreas appear normal. 3.  Study otherwise unremarkable. Electronically Signed   By: Lowella Grip III M.D.   On: 05/07/2017 14:24   ASSESSMENT & PLAN: 56 year old Caucasian male, with past medical history of treated to hepatitis C, alcohol consumption, diabetes, peripheral neuropathy, was referred by his gastroenterologist for abnormal lab   1. Elevated serum iron, rule out hemachromatosis  -I reviewed  the patient's medical records and lab results with him in detail.  -The patient has no family history of liver disease or hemachromatosis, no history of blood transfusion. He does take multivitamin, but he does not think the multivitamin contains iron. -His lab showed moderately elevated serum iron and transferrin saturation, but a normal ferritin.  -I do not have high suspicion for hemachromatosis, I suspect his elevated serum iron and transferrin saturation could be related to his diet I'll supplement, I will repeat his iron studies today and order hemochromatosis DNA test -If his genetic testing results  come back positive, we will order echo and proceed with phlebotomy regimen.  -If negative, he will not need any follow up  -I strongly encouraged him to quit smoking and drinking alcohol to decrease risk of further liver damage.  2. Hepatitis C, DM, Barrett's esophagus -Hep C eradicated with Harvoni in 2016 -He is due for his next colonoscopy in October 2018, endoscopy next year for BE -On Invokana for DM, last POC glucose 174 on 04/30/2017 -Continue follow up with PCP   3. Transaminitis -Possibly related to alcohol, he will continue follow-up with Dr. Len Childs -labs today, will call him with results -if negative work up, he will not need any more f/u here -continue f/u with PCP and GI  Orders Placed This Encounter  Procedures  . Iron and TIBC    Standing Status:   Future    Number of Occurrences:   1    Standing Expiration Date:   05/20/2018  . Hemochromatosis DNA, PCR    Standing Status:   Future    Number of Occurrences:   1    Standing Expiration Date:   05/20/2018   All questions were answered. The patient knows to call the clinic with any problems, questions or concerns. I spent 40 counseling the patient face to face. The total time spent in the appointment was 45 and more than 50% was on counseling.   Alla Feeling, NP 05/20/2017 1:49 PM   I have seen the  patient, examined him. I agree with the assessment and and plan and have edited the notes.   Truitt Merle  05/20/2017

## 2017-05-20 NOTE — Telephone Encounter (Signed)
Per 9/20 los add on lab

## 2017-05-26 ENCOUNTER — Other Ambulatory Visit: Payer: Self-pay | Admitting: Family Medicine

## 2017-05-26 DIAGNOSIS — L03811 Cellulitis of head [any part, except face]: Secondary | ICD-10-CM

## 2017-05-26 MED ORDER — BACITRACIN 500 UNIT/GM EX OINT: 1.0000 "application " | TOPICAL_OINTMENT | Freq: Two times a day (BID) | CUTANEOUS | 0 refills | Status: DC

## 2017-05-26 NOTE — Telephone Encounter (Signed)
Patient is requesting a refill

## 2017-05-27 ENCOUNTER — Other Ambulatory Visit: Payer: Self-pay | Admitting: Family Medicine

## 2017-05-28 LAB — HEMOCHROMATOSIS DNA-PCR(C282Y,H63D)

## 2017-05-31 ENCOUNTER — Telehealth: Payer: Self-pay | Admitting: Hematology

## 2017-05-31 NOTE — Telephone Encounter (Signed)
Spoke with patient and scheduled next available appt that works with their schedule per 9/20 sch msg.

## 2017-06-02 MED FILL — INVOKANA 100 MG TABLET: 100 | 30 days supply | Qty: 30 | Fill #2

## 2017-06-02 MED FILL — OMEPRAZOLE 20 MG CAP: 20 | 30 days supply | Qty: 90 | Fill #2

## 2017-06-02 MED FILL — LOSARTAN POTASSIUM 50 MG TA: 50 | 30 days supply | Qty: 30 | Fill #2

## 2017-06-02 MED FILL — ?ATORVASTATIN 40MG TABLET: 40 | 30 days supply | Qty: 30 | Fill #2

## 2017-06-10 ENCOUNTER — Ambulatory Visit: Payer: Self-pay | Admitting: Family Medicine

## 2017-06-11 ENCOUNTER — Encounter: Payer: Self-pay | Admitting: Gastroenterology

## 2017-06-14 ENCOUNTER — Ambulatory Visit: Payer: Self-pay | Attending: Family Medicine

## 2017-06-21 ENCOUNTER — Telehealth: Payer: Self-pay | Admitting: Gastroenterology

## 2017-06-21 NOTE — Telephone Encounter (Signed)
Left voicemail advising patient that there is a sample of Suprep at the front desk for him to pick up.

## 2017-06-21 NOTE — Telephone Encounter (Signed)
Patient wants to know where his prep for colon on 11.9.18 was sent or where he needs to pick it up. Pt had ov on 9.4.18.

## 2017-06-23 ENCOUNTER — Ambulatory Visit: Payer: Self-pay | Admitting: Hematology

## 2017-06-24 NOTE — Progress Notes (Signed)
[name]  [phone] [phone]  Clinic Follow Up Note   Patient Care Team: Lizbeth Bark, FNP as PCP - General (Family Medicine) Armbruster, [name] Rayas, MD as Consulting Physician (Gastroenterology) 06/25/2017   CHIEF COMPLAINTS:  Rule out hemachromatosis   HISTORY OF PRESENTING ILLNESS: 05/20/17 [name] [name] 56 y.o. male is here because of possible hemachromatosis. He was referred by Dr. Adela Lank when he found iron sat to be elevated at 65% during work up for elevated transaminases on 04/02/2017. The patient has history of hepatitis C in 2016 treated with Harvoni with eradication per Dr. Ronnie Derby. He does not know how he was infected with HCV. He received Hep A and Hep B vaccines in 2016. He has not had a blood transfusion. No IV drug use. He denies family history of liver disease or hemachromatosis. He takes a multivitamin but does not know the iron content. He eats a regular diet heavy in red meat. He drinks 6 beers per day x17 years, no wine or liquor. He smokes 1.5 PPD x35 years.   Today he reports ongoing fatigue and decreased motivation that began when he was diagnosed with depression years ago. He has chronic stable diabetic peripheral neuropathy to his feet. He has bilateral shoulder arthralgia related to rotator cuff tears. He denies weakness, lethargy, headaches, nausea, vomiting, constipation, blood in stool, cough, chest pain, shortness of breath. She is up to date on colonoscopy and endoscopy for barrett's esophagus.    INTERVAL HISTORY:  TAARIQ [name] is here for a follow up. He presents to the clinic today accompanied by his wife.   He has not stopped drinking but says he is able to stop drinking. He notes he has been taking multivitamins that may contin iron.  He notes to previously injuring his left rotator cuff. He has ROM back but he still has pain for it. His PCP referred him to a orthopedist.     MEDICAL HISTORY:    Past Medical History:  Diagnosis Date  . Barrett's esophagus dx 2016  . Bronchitis   . Depression   . Diabetes (HCC)   . ED (erectile dysfunction)   . GERD (gastroesophageal reflux disease)   . Hepatitis C   . Neuropathy    SURGICAL HISTORY: Past Surgical History:  Procedure Laterality Date  . APPENDECTOMY    . COLONOSCOPY N/A 02/16/2014   Procedure: COLONOSCOPY;  Surgeon: Iva Boop, MD;  Location: [facility];  Service: Endoscopy;  Laterality: N/A;  . COLONOSCOPY    . TONSILLECTOMY     SOCIAL HISTORY: Social History   Social History  . Marital status: Single    Spouse name: N/A  . Number of children: N/A  . Years of education: N/A   Occupational History  . Not on file.   Social History Main Topics  . Smoking status: Current Every Day Smoker    Packs/day: 1.50    Years: 35.00    Types: Cigarettes    Last attempt to quit: 03/26/2015  . Smokeless tobacco: Never Used  . Alcohol use 16.2 oz/week    21 Standard drinks or equivalent, 6 Cans of beer per week     Comment: 6 beers per day x17 years (2001)  . Drug use: No  . Sexual activity: Not on file   Other Topics Concern  . Not on file   Social History Narrative  . No narrative on file   FAMILY HISTORY: Family History  Problem Relation Age of  Onset  . Cancer Mother        breast  . Diabetes Mother   . Heart disease Maternal Grandfather   . Pancreatic cancer Paternal Grandmother   . Stroke Father   . Colon cancer Neg Hx   Negative family history of hemochromatosis or liver disease.  ALLERGIES:  has No Known Allergies.  MEDICATIONS:  Current Outpatient Prescriptions  Medication Sig Dispense Refill  . albuterol (VENTOLIN HFA) 108 (90 Base) MCG/ACT inhaler Inhale 2 puffs into the lungs every 6 (six) hours as needed for wheezing or shortness of breath. 18 each 0  . aspirin EC 81 MG tablet Take 1 tablet (81 mg total) by mouth daily. 30 tablet 11  . atorvastatin (LIPITOR) 40 MG tablet Take 1 tablet (40  mg total) by mouth daily. 30 tablet 11  . bacitracin 500 UNIT/GM ointment Apply 1 application topically 2 (two) times daily. For 7 days. 15 g 0  . Blood Glucose Monitoring Suppl (TRUE METRIX METER) w/Device KIT 1 each by Does not apply route as needed. 1 kit 0  . canagliflozin (INVOKANA) 100 MG TABS tablet Take 1 tablet (100 mg total) by mouth daily before breakfast. 30 tablet 11  . citalopram (CELEXA) 20 MG tablet Take 20 mg by mouth daily.    Marland Kitchen glucose blood (TRUE METRIX BLOOD GLUCOSE TEST) test strip 1 each by Other route 3 (three) times daily. 100 each 11  . ibuprofen (ADVIL,MOTRIN) 600 MG tablet Take 1 tablet (600 mg total) by mouth every 8 (eight) hours as needed for moderate pain or cramping (Take with food). 30 tablet 0  . losartan (COZAAR) 50 MG tablet Take 1 tablet (50 mg total) by mouth daily. 90 tablet 3  . Multiple Vitamins-Minerals (MULTIVITAMIN ADULT) TABS Take 1 tablet by mouth daily. 30 tablet 11  . omeprazole (PRILOSEC) 20 MG capsule TAKE ONE CAPSULE BY MOUTH DAILY 90 capsule 3  . testosterone cypionate (DEPOTESTOSTERONE CYPIONATE) 200 MG/ML injection Inject 1 mL (200 mg total) into the muscle every 14 (fourteen) days. For PASS 1 mL 5  . TRUEPLUS LANCETS 28G MISC 1 each by Does not apply route 3 (three) times daily. 100 each 11  . zolpidem (AMBIEN) 10 MG tablet Take 10 mg by mouth at bedtime as needed for sleep. At 2000     No current facility-administered medications for this visit.    REVIEW OF SYSTEMS:   Constitutional: Denies fevers, chills or abnormal night sweats Eyes: Denies blurriness of vision, double vision or watery eyes Ears, nose, mouth, throat, and face: Denies mucositis or sore throat Respiratory: Denies cough, dyspnea or wheezes Cardiovascular: Denies palpitation, chest discomfort or lower extremity swelling Gastrointestinal:  Denies nausea, heartburn or change in bowel habits  Skin: Denies abnormal skin rashes Lymphatics: Denies new lymphadenopathy or easy  bruising Neurological:negative MSK: (+) pain in left rotator cuff Behavioral/Psych: Mood is stable, no new changes  All other systems were reviewed with the patient and are negative.  PHYSICAL EXAMINATION: ECOG PERFORMANCE STATUS: 0 - Asymptomatic  Vitals:   06/25/17 0842  BP: (!) 145/84  Pulse: 95  Resp: 20  Temp: 97.9 F (36.6 C)  SpO2: 97%   Filed Weights   06/25/17 0842  Weight: 174 lb 9.6 oz (79.2 kg)     GENERAL:alert, no distress and comfortable SKIN: skin color, texture, turgor are normal, no rashes or significant lesions EYES: normal, conjunctiva are pink and non-injected, sclera clear OROPHARYNX:no exudate, no erythema and lips, buccal mucosa, and tongue normal  NECK: supple, thyroid normal size, non-tender, without nodularity LYMPH:  no palpable cervical or supraclavicular lymphadenopathy  LUNGS: clear to auscultation bilaterally with normal breathing effort HEART: regular rate & rhythm and no murmurs and no lower extremity edema ABDOMEN:abdomen soft, round, non-tender and normal bowel sounds. No palpable masses. No ascites  Musculoskeletal:no cyanosis of digits and no clubbing  PSYCH: alert & oriented x 3 with fluent speech NEURO: no focal motor deficits  LABORATORY DATA:  I have reviewed the data as listed CBC Latest Ref Rng & Units 04/02/2017 02/18/2016 01/23/2016  WBC 3.4 - 10.8 x10E3/uL 8.1 8.4 12.3(H)  Hemoglobin 13.0 - 17.7 g/dL 17.4 15.2 15.4  Hematocrit 37.5 - 51.0 % 48.6 44.7 44.9  Platelets 150 - 379 x10E3/uL 210 192 235.0   CMP Latest Ref Rng & Units 04/02/2017 08/18/2016 01/02/2016  Glucose 65 - 99 mg/dL 118(H) 132(H) 153(H)  BUN 6 - 24 mg/dL _0 Creatinine 0.76 - 1.27 mg/dL 0.69(L) 0.68(L) 0.87  Sodium 134 - 144 mmol/L 137 138 136  Potassium 3.5 - 5.2 mmol/L 4.8 4.5 4.1  Chloride 96 - 106 mmol/L 97 104 102  CO2 20 - 29 mmol/L _1 Calcium 8.7 - 10.2 mg/dL 9.7 9.4 9.2  Total Protein 6.0 - 8.5 g/dL 7.9 7.5 6.6  Total Bilirubin 0.0 - 1.2  mg/dL 1.1 0.6 0.4  Alkaline Phos 39 - 117 IU/L 71 56 61  AST 0 - 40 IU/L 50(H) 36(H) 26  ALT 0 - 44 IU/L 68(H) 46 38   RADIOGRAPHIC STUDIES: I have personally reviewed the radiological images as listed and agreed with the findings in the report. No results found. ASSESSMENT & PLAN: 56 year old Caucasian male, with past medical history of treated to hepatitis C, alcohol consumption, diabetes, peripheral neuropathy, was referred by his gastroenterologist for abnormal lab   1. Elevated serum iron, rule out hemachromatosis  -I previously reviewed the patient's medical records and lab results with him in detail.  -The patient has no family history of liver disease or hemachromatosis, no history of blood transfusion. He does take multivitamin, but he does not think the multivitamin contains iron. -His lab showed moderately elevated serum iron and transferrin saturation, but a normal ferritin. Repeat iron level was normal  -I suggest he holds any multivitamin or supplements that contain iron. 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 Other mutation of C282Y nad S65C were negative . Pt with H63D carrier usually do not present with hemachromatosis  -However he does have some medical issues which could be related to hemachromatosis such as DM, low testosterone levels and hepatitis.  I would like to do further work up to definitively rule out Hemochromatosis. I suggest a MRI of liver to evaluate iron oeverload in liver.  -If Dr. Enis Gash would favor liver biopsy for his hepatitis, the biopsy can definitively conclude if he has hemochromatosis or not.  -I recommend him to continue monitoring his our level. If his levels remain high and his liver MRI shows evidence of iron overload, I would suggest starting regular phlebotomies.    2. Hepatitis C, DM, Barrett's esophagus -Hep C eradicated with Harvoni in 2016 -He is due for his next colonoscopy in October 2018,  endoscopy next year for BE -On Invokana for DM, last POC glucose 174 on 04/30/2017 -Continue follow up with PCP   3. Transaminitis -Possibly related to alcohol, he will continue follow-up with Dr. Havery Moros  -I strongly encouraged  him to stop drinking any alcohol   PLAN -Liver MRI wo contrast to evaluate iron content in 3-4 weeks -F/u in 2 months with lab one week before -I will discuss liver biopsy with Dr. Havery Moros    Orders Placed This Encounter  Procedures  . MR Abdomen Wo Contrast    Standing Status:   Future    Standing Expiration Date:   06/25/2018    Scheduling Instructions:     Evaluate iron overload in liver, rule out hemachromatosis    Order Specific Question:   What is the patient's sedation requirement?    Answer:   No Sedation    Order Specific Question:   Does the patient have a pacemaker or implanted devices?    Answer:   No    Order Specific Question:   Preferred imaging location?    Answer:   [name] (table limit-350 lbs)    Order Specific Question:   Radiology Contrast Protocol - do NOT remove file path    Answer:   [email or web address]   All questions were answered. The patient knows to call the clinic with any problems, questions or concerns. I spent 20 counseling the patient face to face. The total time spent in the appointment was 25 and more than 50% was on counseling.   Truitt Merle, MD 06/25/2017 10:53 PM  This document serves as a record of services personally performed by Truitt Merle, MD. It was created on her behalf by Joslyn Devon, a trained medical scribe. The creation of this record is based on the scribe's personal observations and the provider's statements to them. This document has been checked and approved by the attending provider.

## 2017-06-25 ENCOUNTER — Ambulatory Visit (HOSPITAL_BASED_OUTPATIENT_CLINIC_OR_DEPARTMENT_OTHER): Payer: Self-pay | Admitting: Hematology

## 2017-06-25 ENCOUNTER — Telehealth: Payer: Self-pay | Admitting: Hematology

## 2017-06-25 VITALS — BP 145/84 | HR 95 | Temp 97.9°F | Resp 20 | Ht 71.0 in | Wt 174.6 lb

## 2017-06-25 DIAGNOSIS — I10 Essential (primary) hypertension: Secondary | ICD-10-CM

## 2017-06-25 DIAGNOSIS — R790 Abnormal level of blood mineral: Secondary | ICD-10-CM

## 2017-06-25 DIAGNOSIS — E291 Testicular hypofunction: Secondary | ICD-10-CM

## 2017-06-25 NOTE — Telephone Encounter (Signed)
Gave avs and calendar for December  °

## 2017-06-27 ENCOUNTER — Encounter: Payer: Self-pay | Admitting: Hematology

## 2017-07-05 MED FILL — INVOKANA 100 MG TABLET: 100 | 30 days supply | Qty: 30 | Fill #3

## 2017-07-05 MED FILL — ?ATORVASTATIN 40MG TABLET: 40 | 30 days supply | Qty: 30 | Fill #3

## 2017-07-05 MED FILL — ?OMEPRAZOLE DR 20 MG CAPSUL: 20 | 30 days supply | Qty: 3 | Fill #3

## 2017-07-05 MED FILL — LOSARTAN POTASSIUM 50 MG TA: 50 | 30 days supply | Qty: 30 | Fill #3

## 2017-07-08 ENCOUNTER — Ambulatory Visit: Payer: Self-pay | Attending: Family Medicine | Admitting: Family Medicine

## 2017-07-08 ENCOUNTER — Encounter: Payer: Self-pay | Admitting: Family Medicine

## 2017-07-08 VITALS — BP 157/84 | HR 78 | Temp 99.4°F | Resp 18 | Ht 71.0 in | Wt 173.8 lb

## 2017-07-08 DIAGNOSIS — E785 Hyperlipidemia, unspecified: Secondary | ICD-10-CM | POA: Insufficient documentation

## 2017-07-08 DIAGNOSIS — L989 Disorder of the skin and subcutaneous tissue, unspecified: Secondary | ICD-10-CM

## 2017-07-08 DIAGNOSIS — G4700 Insomnia, unspecified: Secondary | ICD-10-CM

## 2017-07-08 DIAGNOSIS — Z79899 Other long term (current) drug therapy: Secondary | ICD-10-CM | POA: Insufficient documentation

## 2017-07-08 DIAGNOSIS — G8929 Other chronic pain: Secondary | ICD-10-CM

## 2017-07-08 DIAGNOSIS — L03031 Cellulitis of right toe: Secondary | ICD-10-CM

## 2017-07-08 DIAGNOSIS — M7989 Other specified soft tissue disorders: Secondary | ICD-10-CM | POA: Insufficient documentation

## 2017-07-08 DIAGNOSIS — M25612 Stiffness of left shoulder, not elsewhere classified: Secondary | ICD-10-CM

## 2017-07-08 DIAGNOSIS — Z7982 Long term (current) use of aspirin: Secondary | ICD-10-CM | POA: Insufficient documentation

## 2017-07-08 DIAGNOSIS — L988 Other specified disorders of the skin and subcutaneous tissue: Secondary | ICD-10-CM | POA: Insufficient documentation

## 2017-07-08 DIAGNOSIS — Z0000 Encounter for general adult medical examination without abnormal findings: Secondary | ICD-10-CM

## 2017-07-08 DIAGNOSIS — I10 Essential (primary) hypertension: Secondary | ICD-10-CM

## 2017-07-08 DIAGNOSIS — F1010 Alcohol abuse, uncomplicated: Secondary | ICD-10-CM

## 2017-07-08 DIAGNOSIS — E1142 Type 2 diabetes mellitus with diabetic polyneuropathy: Secondary | ICD-10-CM

## 2017-07-08 DIAGNOSIS — M25512 Pain in left shoulder: Secondary | ICD-10-CM

## 2017-07-08 DIAGNOSIS — Z23 Encounter for immunization: Secondary | ICD-10-CM

## 2017-07-08 LAB — POCT GLYCOSYLATED HEMOGLOBIN (HGB A1C): HEMOGLOBIN A1C: 7

## 2017-07-08 LAB — GLUCOSE, POCT (MANUAL RESULT ENTRY): POC GLUCOSE: 217 mg/dL — AB (ref 70–99)

## 2017-07-08 MED ORDER — BACITRACIN 500 UNIT/GM EX OINT: 1.0000 "application " | TOPICAL_OINTMENT | Freq: Two times a day (BID) | CUTANEOUS | 0 refills | Status: DC

## 2017-07-08 MED ORDER — IBUPROFEN 600 MG PO TABS: 600.0000 mg | ORAL_TABLET | Freq: Three times a day (TID) | ORAL | 0 refills | Status: DC | PRN

## 2017-07-08 MED ORDER — CEPHALEXIN 500 MG PO CAPS: 500.0000 mg | ORAL_CAPSULE | Freq: Four times a day (QID) | ORAL | 0 refills | Status: DC

## 2017-07-08 MED ORDER — GABAPENTIN 300 MG PO CAPS: 300.0000 mg | ORAL_CAPSULE | Freq: Every day | ORAL | 2 refills | Status: DC

## 2017-07-08 MED ORDER — LOSARTAN POTASSIUM 100 MG PO TABS: 100.0000 mg | ORAL_TABLET | Freq: Every day | ORAL | 3 refills | Status: DC

## 2017-07-08 MED FILL — GABAPENTIN 300 MG CAPSULE: 300 | 30 days supply | Qty: 30 | Fill #0

## 2017-07-08 MED FILL — CEPHALEXIN 500 MG CAPSULE: 500 | 5 days supply | Qty: 20 | Fill #0

## 2017-07-08 MED FILL — IBUPROFEN 600 MG TABLET: 600 | 13 days supply | Qty: 40 | Fill #0

## 2017-07-08 MED FILL — LOSARTAN POTASSIUM 100 MG T: 100 | 30 days supply | Qty: 30 | Fill #0

## 2017-07-08 NOTE — Patient Instructions (Signed)

## 2017-07-08 NOTE — Progress Notes (Deleted)
Last night  Beers 6  Or more  MRI scheduled in December    4 months  No grip  No injury  Heavy lifting  Left shoulder  Lateral extension dec. ROM No parathasias    7/10   Sleep Dermatology  Itches  4 to 5  months  Size  Prolonged sunligt   DM   Recheck BP   Pins needles  2 years   Gabapentin  ABT   Vision screen needed   pnue 13    [place]

## 2017-07-09 ENCOUNTER — Other Ambulatory Visit: Payer: Self-pay

## 2017-07-09 ENCOUNTER — Ambulatory Visit (AMBULATORY_SURGERY_CENTER): Payer: Self-pay | Admitting: Gastroenterology

## 2017-07-09 ENCOUNTER — Encounter: Payer: Self-pay | Admitting: Gastroenterology

## 2017-07-09 VITALS — BP 145/85 | HR 62 | Temp 97.8°F | Resp 17 | Ht 71.0 in | Wt 174.0 lb

## 2017-07-09 DIAGNOSIS — K633 Ulcer of intestine: Secondary | ICD-10-CM

## 2017-07-09 DIAGNOSIS — D125 Benign neoplasm of sigmoid colon: Secondary | ICD-10-CM

## 2017-07-09 DIAGNOSIS — Z86010 Personal history of colonic polyps: Secondary | ICD-10-CM

## 2017-07-09 DIAGNOSIS — D127 Benign neoplasm of rectosigmoid junction: Secondary | ICD-10-CM

## 2017-07-09 DIAGNOSIS — K6389 Other specified diseases of intestine: Secondary | ICD-10-CM

## 2017-07-09 DIAGNOSIS — K635 Polyp of colon: Secondary | ICD-10-CM

## 2017-07-09 MED ORDER — SODIUM CHLORIDE 0.9 % IV SOLN
500.0000 mL | INTRAVENOUS | Status: DC
Start: 2017-07-09 — End: 2017-07-09

## 2017-07-09 NOTE — Patient Instructions (Signed)
YOU HAD AN ENDOSCOPIC PROCEDURE TODAY AT THE [facility]:   Refer to the procedure report that was given to you for any specific questions about what was found during the examination.  If the procedure report does not answer your questions, please call your gastroenterologist to clarify.  If you requested that your care partner not be given the details of your procedure findings, then the procedure report has been included in a sealed envelope for you to review at your convenience later.  YOU SHOULD EXPECT: Some feelings of bloating in the abdomen. Passage of more gas than usual.  Walking can help get rid of the air that was put into your GI tract during the procedure and reduce the bloating. If you had a lower endoscopy (such as a colonoscopy or flexible sigmoidoscopy) you may notice spotting of blood in your stool or on the toilet paper. If you underwent a bowel prep for your procedure, you may not have a normal bowel movement for a few days.  Please Note:  You might notice some irritation and congestion in your nose or some drainage.  This is from the oxygen used during your procedure.  There is no need for concern and it should clear up in a day or so.  SYMPTOMS TO REPORT IMMEDIATELY:   Following lower endoscopy (colonoscopy or flexible sigmoidoscopy):  Excessive amounts of blood in the stool  Significant tenderness or worsening of abdominal pains  Swelling of the abdomen that is new, acute  Fever of 100F or higher  For urgent or emergent issues, a gastroenterologist can be reached at any hour by calling [phone].   DIET:  We do recommend a small meal at first, but then you may proceed to your regular diet.  Drink plenty of fluids but you should avoid alcoholic beverages for 24 hours.  ACTIVITY:  You should plan to take it easy for the rest of today and you should NOT DRIVE or use heavy machinery until tomorrow (because of the sedation medicines used during the test).     FOLLOW UP: Our staff will call the number listed on your records the next business day following your procedure to check on you and address any questions or concerns that you may have regarding the information given to you following your procedure. If we do not reach you, we will leave a message.  However, if you are feeling well and you are not experiencing any problems, there is no need to return our call.  We will assume that you have returned to your regular daily activities without incident.  If any biopsies were taken you will be contacted by phone or by letter within the next 1-3 weeks.  Please call us at [phone] if you have not heard about the biopsies in 3 weeks.    SIGNATURES/CONFIDENTIALITY: You and/or your care partner have signed paperwork which will be entered into your electronic medical record.  These signatures attest to the fact that that the information above on your After Visit Summary has been reviewed and is understood.  Full responsibility of the confidentiality of this discharge information lies with you and/or your care-partner  Polyp, diverticulosis, and hemorrhoid information given.. 

## 2017-07-09 NOTE — Progress Notes (Signed)
To PACU, VSS. Report to [email or web address] 

## 2017-07-09 NOTE — Op Note (Signed)
[facility] Patient Name: [name] [name] Procedure Date: 07/09/2017 2:58 PM MRN: [id number] Endoscopist: Remo Lipps P. Shelba Susi MD, MD Age: 56 Referring MD:  Date of Birth: 09-23-1960 Gender: Male Account #: [id number] Procedure:                Colonoscopy Indications:              Surveillance: Personal history of 2cm tubulovillous                            adenomatous polyps on last colonoscopy 3 years ago Medicines:                Monitored Anesthesia Care Procedure:                Pre-Anesthesia Assessment:                           - Prior to the procedure, a History and Physical                            was performed, and patient medications and                            allergies were reviewed. The patient's tolerance of                            previous anesthesia was also reviewed. The risks                            and benefits of the procedure and the sedation                            options and risks were discussed with the patient.                            All questions were answered, and informed consent                            was obtained. Prior Anticoagulants: The patient has                            taken no previous anticoagulant or antiplatelet                            agents. ASA Grade Assessment: II - A patient with                            mild systemic disease. After reviewing the risks                            and benefits, the patient was deemed in                            satisfactory condition to undergo the procedure.  After obtaining informed consent, the colonoscope                            was passed under direct vision. Throughout the                            procedure, the patient's blood pressure, pulse, and                            oxygen saturations were monitored continuously. The                            Colonoscope was introduced through the anus and   advanced to the the cecum, identified by                            appendiceal orifice and ileocecal valve. The                            colonoscopy was performed without difficulty. The                            patient tolerated the procedure well. The quality                            of the bowel preparation was good. The ileocecal                            valve, appendiceal orifice, and rectum were                            photographed. Scope In: 3:08:39 PM Scope Out: 3:25:05 PM Scope Withdrawal Time: 0 hours 13 minutes 37 seconds  Total Procedure Duration: 0 hours 16 minutes 26 seconds  Findings:                 The perianal and digital rectal examinations were                            normal.                           Many medium-mouthed diverticula were found in the                            entire colon.                           A localized area of superficial erythematous mucosa                            was found in the ascending colon. Biopsies were                            taken with a cold forceps for histology.  A 4 mm polyp was found in the sigmoid colon. The                            polyp was sessile. The polyp was removed with a                            cold snare. Resection and retrieval were complete.                           A 3 mm polyp was found in the recto-sigmoid colon.                            The polyp was sessile. The polyp was removed with a                            cold snare. Resection and retrieval were complete.                           Internal hemorrhoids were found during retroflexion.                           The exam was otherwise without abnormality. Complications:            No immediate complications. Estimated blood loss:                            Minimal. Estimated Blood Loss:     Estimated blood loss was minimal. Impression:               - Diverticulosis in the entire examined colon.                            - Superficial erythematous mucosa in the ascending                            colon. Biopsied.                           - One 4 mm polyp in the sigmoid colon, removed with                            a cold snare. Resected and retrieved.                           - One 3 mm polyp at the recto-sigmoid colon,                            removed with a cold snare. Resected and retrieved.                           - Internal hemorrhoids.                           - The examination was otherwise normal. Recommendation:           -  Patient has a contact number available for                            emergencies. The signs and symptoms of potential                            delayed complications were discussed with the                            patient. Return to normal activities tomorrow.                            Written discharge instructions were provided to the                            patient.                           - Resume previous diet.                           - Continue present medications.                           - Await pathology results.                           - Repeat colonoscopy in 5 years for surveillance                            given large tubulovillous adenoma removed 3 years                            ago [place]. Aristides Luckey MD, MD 07/09/2017 3:30:26 PM This report has been signed electronically.

## 2017-07-09 NOTE — Progress Notes (Signed)
Called to room to assist during endoscopic procedure.  Patient ID and intended procedure confirmed with present staff. Received instructions for my participation in the procedure from the performing physician.  

## 2017-07-12 ENCOUNTER — Telehealth: Payer: Self-pay

## 2017-07-12 ENCOUNTER — Telehealth: Payer: Self-pay | Admitting: *Deleted

## 2017-07-12 NOTE — Telephone Encounter (Signed)
  Follow up Call-  Call back number 07/09/2017 11/29/2014  Post procedure Call Back phone  # [phone]  Permission to leave phone message Yes Yes  Some recent data might be hidden     Patient questions:  Message left to call us if necessary.

## 2017-07-12 NOTE — Telephone Encounter (Signed)
  Follow up Call-  Call back number 07/09/2017 11/29/2014  Post procedure Call Back phone  # [phone]  Permission to leave phone message Yes Yes  Some recent data might be hidden     Patient questions:  Do you have a fever, pain , or abdominal swelling? No. Pain Score  0 *  Have you tolerated food without any problems? Yes.    Have you been able to return to your normal activities? Yes.    Do you have any questions about your discharge instructions: Diet   No. Medications  No. Follow up visit  No.  Do you have questions or concerns about your Care? No.  Actions: * If pain score is 4 or above: No action needed, pain <4.

## 2017-07-14 ENCOUNTER — Encounter: Payer: Self-pay | Admitting: Gastroenterology

## 2017-07-18 NOTE — Progress Notes (Signed)
Subjective:  Patient ID: [name] [name], male    DOB: 1960-11-30  Age: 56 y.o. MRN: [id number]  CC: Shoulder Pain and Follow-up   HPI GIOVONI BUNCH presents for follow up of DM, HTN.  Past medical history of diabetes, hypertension, hyperlipidemia, alcohol abuse.  DM: symptoms-paresthesias of the feet, onset 2 years ago.  Patient denies foot ulcerations, nausea, polydipsia, polyuria, visual disturbances, vomitting and weight loss.  Evaluation to date has been included: fasting blood sugar and hemoglobin A1C.  Home sugars: patient does not check sugars. Treatment to date: invokana. History of HTN. He is not exercising and is not adherent to low salt diet.  He does not check BP at home. He reports not taking BP medications prior to office visit. Cardiac symptoms none. Patient denies chest pain, chest pressure/discomfort, claudication, dyspnea, near-syncope and syncope.  Cardiovascular risk factors: diabetes mellitus, dyslipidemia, hypertension, male gender, sedentary lifestyle and smoking/ tobacco exposure. Current smoker 1/2 pack per week. He is not ready to quit at this time. Use of agents associated with hypertension: none. History of target organ damage: none. Patient complains of arthralgias for which has been present for 4 months. Pain is located in the left shoulder(s), is described as aching, and is intermittent . Pain 7/10.   Associated symptoms include: decreased range of motion and difficulty grip.  The patient has tried nothing for pain relief. History of heavy lifting. He denies any paresthesias. Related to injury:  History of rotator cuff injury. .Patient c/o skin. lesion is located in the scalp. Onset was 4 months ago. Associated symptoms pruritis, tenderness/ redness. Denies any  of spontaneous drainage or fever. Patient does not have previous history of cutaneous abscesses. He does reports history of prolonged sun-exposure. History of alcohol use. Reports  6 beers last night.He reports  receiving services from [facility]. He declines speaking with LCSW at this time.  Recent history of a dentist visit.  Dentist expressed concerns that patient may have possible OSA due to dental assessment findings.  Patient does report history of bruxism in the past.  Outpatient Medications Prior to Visit  Medication Sig Dispense Refill  . albuterol (VENTOLIN HFA) 108 (90 Base) MCG/ACT inhaler Inhale 2 puffs into the lungs every 6 (six) hours as needed for wheezing or shortness of breath. (Patient not taking: Reported on 07/09/2017) 18 each 0  . aspirin EC 81 MG tablet Take 1 tablet (81 mg total) by mouth daily. (Patient not taking: Reported on 07/09/2017) 30 tablet 11  . atorvastatin (LIPITOR) 40 MG tablet Take 1 tablet (40 mg total) by mouth daily. 30 tablet 11  . Blood Glucose Monitoring Suppl (TRUE METRIX METER) w/Device KIT 1 each by Does not apply route as needed. 1 kit 0  . canagliflozin (INVOKANA) 100 MG TABS tablet Take 1 tablet (100 mg total) by mouth daily before breakfast. 30 tablet 11  . citalopram (CELEXA) 20 MG tablet Take 20 mg by mouth daily.    Marland Kitchen glucose blood (TRUE METRIX BLOOD GLUCOSE TEST) test strip 1 each by Other route 3 (three) times daily. 100 each 11  . Multiple Vitamins-Minerals (MULTIVITAMIN ADULT) TABS Take 1 tablet by mouth daily. 30 tablet 11  . omeprazole (PRILOSEC) 20 MG capsule TAKE ONE CAPSULE BY MOUTH DAILY 90 capsule 3  . testosterone cypionate (DEPOTESTOSTERONE CYPIONATE) 200 MG/ML injection Inject 1 mL (200 mg total) into the muscle every 14 (fourteen) days. For PASS (Patient not taking: Reported on 07/09/2017) 1 mL 5  . TRUEPLUS LANCETS 28G  MISC 1 each by Does not apply route 3 (three) times daily. 100 each 11  . zolpidem (AMBIEN) 10 MG tablet Take 10 mg by mouth at bedtime as needed for sleep. At 2000    . bacitracin 500 UNIT/GM ointment Apply 1 application topically 2 (two) times daily. For 7 days. 15 g 0  . ibuprofen (ADVIL,MOTRIN) 600 MG tablet Take 1 tablet  (600 mg total) by mouth every 8 (eight) hours as needed for moderate pain or cramping (Take with food). 30 tablet 0  . losartan (COZAAR) 50 MG tablet Take 1 tablet (50 mg total) by mouth daily. 90 tablet 3   No facility-administered medications prior to visit.     ROS Review of Systems  Constitutional: Negative.   Eyes: Negative.   Respiratory: Negative.   Cardiovascular: Negative.   Gastrointestinal: Negative.   Genitourinary: Negative.   Musculoskeletal: Positive for arthralgias.  Skin:       Scalp lesion   Neurological: Negative.   Psychiatric/Behavioral: Positive for behavioral problems (history of alcoholism).    Objective:  BP (!) 157/84   Pulse 78   Temp 99.4 F (37.4 C) (Oral)   Resp 18   Ht _0  (1.803 m)   Wt 173 lb 12.8 oz (78.8 kg)   SpO2 96%   BMI 24.24 kg/m   BP/Weight 07/09/2017 07/08/2017 02/24/9484  Systolic BP 462 703 500  Diastolic BP 85 84 84  Wt. (Lbs) 174 173.8 174.6  BMI 24.27 24.24 24.35    Physical Exam  Constitutional: He appears well-developed and well-nourished.  HENT:  Head: Normocephalic and atraumatic.  Right Ear: External ear normal.  Left Ear: External ear normal.  Nose: Nose normal.  Mouth/Throat: Oropharynx is clear and moist.  Eyes: Conjunctivae are normal. Pupils are equal, round, and reactive to light.  Neck: Normal range of motion. Neck supple. No JVD present.  Cardiovascular: Normal rate, regular rhythm, normal heart sounds and intact distal pulses.  Pulmonary/Chest: Effort normal and breath sounds normal.  Abdominal: Soft. Bowel sounds are normal. There is no tenderness.  Musculoskeletal:       Left shoulder: He exhibits decreased range of motion (with lateral extension.) and pain.  Skin: Skin is warm and dry. Lesion (to occipital scalp; no drainage present. redness and tenderness.) noted.  Mild erythema and swelling to the digit of right foot.  Psychiatric: His mood appears anxious. He expresses no homicidal and no  suicidal ideation. He expresses no suicidal plans and no homicidal plans.  Nursing note and vitals reviewed.   Assessment & Plan:   1. Controlled type 2 diabetes mellitus with diabetic polyneuropathy, without long-term current use of insulin (HCC)  - Glucose (CBG) - HgB A1c - Ambulatory referral to Ophthalmology - gabapentin (NEURONTIN) 300 MG capsule; Take 1 capsule (300 mg total) at bedtime by mouth. (Patient not taking: Reported on 07/09/2017)  Dispense: 30 capsule; Refill: 2  2. Chronic left shoulder pain  - Ambulatory referral to Orthopedics - ibuprofen (ADVIL,MOTRIN) 600 MG tablet; Take 1 tablet (600 mg total) every 8 (eight) hours as needed by mouth for moderate pain (Take with food.). (Patient not taking: Reported on 07/09/2017)  Dispense: 40 tablet; Refill: 0  3. Cellulitis of toe of right foot Patient reports incidentally hitting his foot against an object while walking.  Importance of diabetic foot care discussed. - bacitracin 500 UNIT/GM ointment; Apply 1 application 2 (two) times daily topically. For 7 days. (Patient not taking: Reported on 07/09/2017)  Dispense: 15 g; Refill:  0 - cephALEXin (KEFLEX) 500 MG capsule; Take 1 capsule (500 mg total) 4 (four) times daily by mouth. (Patient not taking: Reported on 07/09/2017)  Dispense: 20 capsule; Refill: 0  4. Decreased ROM of left shoulder  - Ambulatory referral to Orthopedics - DG Shoulder Left; Future  5. Skin lesion of scalp  - Ambulatory referral to Dermatology - bacitracin 500 UNIT/GM ointment; Apply 1 application 2 (two) times daily topically. For 7 days. (Patient not taking: Reported on 07/09/2017)  Dispense: 15 g; Refill: 0  6. Frequent nocturnal awakening  - Nocturnal polysomnography (NPSG); Future  7. ETOH abuse Provided patient with information for alcoholism resources.  8. Essential hypertension  - losartan (COZAAR) 100 MG tablet; Take 1 tablet (100 mg total) daily by mouth.  Dispense: 90 tablet; Refill:  3  9. Healthcare maintenance  - Pneumococcal conjugate vaccine 13-valent     Follow-up: Return in about 3 weeks (around 07/29/2017), or if symptoms worsen or fail to improve, for Cellulitus/ BP check .   Alfonse Spruce FNP

## 2017-07-20 ENCOUNTER — Ambulatory Visit (INDEPENDENT_AMBULATORY_CARE_PROVIDER_SITE_OTHER): Payer: Self-pay | Admitting: Family Medicine

## 2017-07-20 VITALS — BP 150/96 | Ht 71.0 in | Wt 178.0 lb

## 2017-07-20 DIAGNOSIS — M7522 Bicipital tendinitis, left shoulder: Secondary | ICD-10-CM

## 2017-07-20 DIAGNOSIS — M75102 Unspecified rotator cuff tear or rupture of left shoulder, not specified as traumatic: Secondary | ICD-10-CM

## 2017-07-20 MED ORDER — METHYLPREDNISOLONE ACETATE 40 MG/ML IJ SUSP
40.0000 mg | Freq: Once | INTRAMUSCULAR | Status: AC
  Administered 2017-07-20: 40 mg via INTRA_ARTICULAR

## 2017-07-23 NOTE — Progress Notes (Signed)
  [name] [name] - 56 y.o. male MRN [id number]  Date of birth: 11/26/1960    SUBJECTIVE:      Chief Complaint:/ HPI:  LEFT shoulder pain for several weeks / moths. Getting worse. Right hand dominant. Pain is worse with overhead activities. Has been working [employer] at gym for long time but recently notes certain exercises are more painful (bicep curl, chest press, lat pulldown). Works in maintenance Pain is incenter of shoulder joint, front of arm and middle of deltoid. Aching. Rest helps some.    ROS:     Pertinent review of systems: negative for fever or unusual weight change. No numbness or tingling in LUE or hand. No rash. No chest pan.  PERTINENT  PMH / PSH FH / / SH:  Past Medical, Surgical, Social, and Family History Reviewed & Updated in the EMR.  Pertinent findings include:  Chronic Hepatitis C Intermittent smoker Uses alcohol DM Hx hypogonadism No shoulder surgeries Hx Barrett's esophagus  OBJECTIVE: BP (!) 150/96   Ht 5\' 11"  (1.803 m)   Wt 178 lb (80.7 kg)   BMI 24.83 kg/m   Physical Exam:  Vital signs are reviewed.  WD WN NAD Shoulders symmetrical. LEFT shoulder has FROM and 5/5 strength in all planes rotatir cuff. Some mild pain with impingement testing and supraspinatus testing. Bicep tendon ttp and mild pain with AROM. Normal bicep strength symetrical.  NEURO intact sensation B hands and upper extrmities VASC radial pulses 2+B= SKIN no rash, no erythema, no unusual warmth LUE  PROCEDURE: INJECTION:subacromial bursa Patient was given informed consent, signed copy in the chart. Appropriate time out was taken. Area prepped and draped in usual sterile fashion. Ethyl chloride was  used for local anesthesia. A 21 gauge 1 1/2 inch needle was used.. 1 cc of methylprednisolone 40 mg/ml plus  4 cc of 1% lidocaine without epinephrine was injected into the LEFT subacromial bursa using a(n) posterior approach.   The patient tolerated the procedure well. There were no  complications. Post procedure instructions were given.  PROCEDURE: INJECTION: LEFT bicep tendon sheath Patient was given informed consent, signed copy in the chart. Appropriate time out was taken. Area prepped and draped in usual sterile fashion. Ethyl chloride was  used for local anesthesia. A 21 gauge 1 1/2 inch needle was used.. 1 cc of methylprednisolone 40 mg/ml plus  *1cc of 1% lidocaine without epinephrine was injected into the sheath of the left bicep tendon using a(n) anterior approach.   The patient tolerated the procedure well. There were no complications. Post procedure instructions were given.    ASSESSMENT & PLAN:  Shoulder pain: a combination of subacromial bursitis and bicipital tendinitis. CSI today. Discussed activity modifications and f/u 3 weeks.

## 2017-07-26 ENCOUNTER — Ambulatory Visit (HOSPITAL_COMMUNITY)
Admission: RE | Admit: 2017-07-26 | Discharge: 2017-07-26 | Disposition: A | Discharge status: Home / Self Care | Payer: Self-pay | Source: Ambulatory Visit | Attending: Hematology | Admitting: Hematology

## 2017-07-26 DIAGNOSIS — R932 Abnormal findings on diagnostic imaging of liver and biliary tract: Secondary | ICD-10-CM | POA: Insufficient documentation

## 2017-07-26 DIAGNOSIS — K760 Fatty (change of) liver, not elsewhere classified: Secondary | ICD-10-CM | POA: Insufficient documentation

## 2017-08-03 ENCOUNTER — Ambulatory Visit: Payer: Self-pay | Attending: Family Medicine | Admitting: Family Medicine

## 2017-08-03 ENCOUNTER — Encounter: Payer: Self-pay | Admitting: Family Medicine

## 2017-08-03 VITALS — BP 146/86 | HR 98 | Temp 99.0°F | Resp 18 | Ht 71.0 in | Wt 174.0 lb

## 2017-08-03 DIAGNOSIS — Z7984 Long term (current) use of oral hypoglycemic drugs: Secondary | ICD-10-CM | POA: Insufficient documentation

## 2017-08-03 DIAGNOSIS — G8929 Other chronic pain: Secondary | ICD-10-CM

## 2017-08-03 DIAGNOSIS — Z79899 Other long term (current) drug therapy: Secondary | ICD-10-CM | POA: Insufficient documentation

## 2017-08-03 DIAGNOSIS — E119 Type 2 diabetes mellitus without complications: Secondary | ICD-10-CM | POA: Insufficient documentation

## 2017-08-03 DIAGNOSIS — I10 Essential (primary) hypertension: Secondary | ICD-10-CM | POA: Insufficient documentation

## 2017-08-03 DIAGNOSIS — IMO0001 Reserved for inherently not codable concepts without codable children: Secondary | ICD-10-CM

## 2017-08-03 DIAGNOSIS — Z7982 Long term (current) use of aspirin: Secondary | ICD-10-CM | POA: Insufficient documentation

## 2017-08-03 DIAGNOSIS — E1165 Type 2 diabetes mellitus with hyperglycemia: Secondary | ICD-10-CM

## 2017-08-03 DIAGNOSIS — M25512 Pain in left shoulder: Secondary | ICD-10-CM

## 2017-08-03 DIAGNOSIS — E785 Hyperlipidemia, unspecified: Secondary | ICD-10-CM | POA: Insufficient documentation

## 2017-08-03 LAB — POCT URINALYSIS DIPSTICK
Bilirubin, UA: NEGATIVE
Blood, UA: NEGATIVE
Glucose, UA: 500
KETONES UA: NEGATIVE
LEUKOCYTES UA: NEGATIVE
NITRITE UA: NEGATIVE
PH UA: 6 (ref 5.0–8.0)
PROTEIN UA: NEGATIVE
Spec Grav, UA: <=1.005 — AB (ref 1.010–1.025)
Urobilinogen, UA: 0.2 E.U./dL

## 2017-08-03 LAB — GLUCOSE, POCT (MANUAL RESULT ENTRY): POC GLUCOSE: 395 mg/dL — AB (ref 70–99)

## 2017-08-03 MED ORDER — INSULIN ASPART 100 UNIT/ML ~~LOC~~ SOLN: 20.0000 [IU] | Freq: Once | SUBCUTANEOUS | Status: DC

## 2017-08-03 MED ORDER — DICLOFENAC SODIUM 50 MG PO TBEC: 50.0000 mg | DELAYED_RELEASE_TABLET | Freq: Two times a day (BID) | ORAL | 0 refills | Status: DC | PRN

## 2017-08-03 MED FILL — ?DICLOFENAC SOD DR 50 MG TA: 50 | 20 days supply | Qty: 40 | Fill #0

## 2017-08-03 NOTE — Progress Notes (Signed)
idsPatient is here for f/up   Patient complains left shoulder being uncomfortable

## 2017-08-03 NOTE — Patient Instructions (Addendum)
Apply for orange card. Follow up with your orthopedic specialist. Bring blood sugar log or glucometer to next office visit.  Blood Glucose Monitoring, Adult Monitoring your blood sugar (glucose) helps you manage your diabetes. It also helps you and your health care provider determine how well your diabetes management plan is working. Blood glucose monitoring involves checking your blood glucose as often as directed, and keeping a record (log) of your results over time. Why should I monitor my blood glucose? Checking your blood glucose regularly can:  Help you understand how food, exercise, illnesses, and medicines affect your blood glucose.  Let you know what your blood glucose is at any time. You can quickly tell if you are having low blood glucose (hypoglycemia) or high blood glucose (hyperglycemia).  Help you and your health care provider adjust your medicines as needed.  When should I check my blood glucose? Follow instructions from your health care provider about how often to check your blood glucose. This may depend on:  The type of diabetes you have.  How well-controlled your diabetes is.  Medicines you are taking.  If you have type 1 diabetes:  Check your blood glucose at least 2 times a day.  Also check your blood glucose: ? Before every insulin injection. ? Before and after exercise. ? Between meals. ? 2 hours after a meal. ? Occasionally between 2:00 a.m. and 3:00 a.m., as directed. ? Before potentially dangerous tasks, like driving or using heavy machinery. ? At bedtime.  You may need to check your blood glucose more often, up to 6-10 times a day: ? If you use an insulin pump. ? If you need multiple daily injections (MDI). ? If your diabetes is not well-controlled. ? If you are ill. ? If you have a history of severe hypoglycemia. ? If you have a history of not knowing when your blood glucose is getting low (hypoglycemia unawareness). If you have type 2  diabetes:  If you take insulin or other diabetes medicines, check your blood glucose at least 2 times a day.  If you are on intensive insulin therapy, check your blood glucose at least 4 times a day. Occasionally, you may also need to check between 2:00 a.m. and 3:00 a.m., as directed.  Also check your blood glucose: ? Before and after exercise. ? Before potentially dangerous tasks, like driving or using heavy machinery.  You may need to check your blood glucose more often if: ? Your medicine is being adjusted. ? Your diabetes is not well-controlled. ? You are ill. What is a blood glucose log?  A blood glucose log is a record of your blood glucose readings. It helps you and your health care provider: ? Look for patterns in your blood glucose over time. ? Adjust your diabetes management plan as needed.  Every time you check your blood glucose, write down your result and notes about things that may be affecting your blood glucose, such as your diet and exercise for the day.  Most glucose meters store a record of glucose readings in the meter. Some meters allow you to download your records to a computer. How do I check my blood glucose? Follow these steps to get accurate readings of your blood glucose: Supplies needed   Blood glucose meter.  Test strips for your meter. Each meter has its own strips. You must use the strips that come with your meter.  A needle to prick your finger (lancet). Do not use lancets more than once.  A device that holds the lancet (lancing device).  A journal or log book to write down your results. Procedure  Wash your hands with soap and water.  Prick the side of your finger (not the tip) with the lancet. Use a different finger each time.  Gently rub the finger until a small drop of blood appears.  Follow instructions that come with your meter for inserting the test strip, applying blood to the strip, and using your blood glucose meter.  Write  down your result and any notes. Alternative testing sites  Some meters allow you to use areas of your body other than your finger (alternative sites) to test your blood.  If you think you may have hypoglycemia, or if you have hypoglycemia unawareness, do not use alternative sites. Use your finger instead.  Alternative sites may not be as accurate as the fingers, because blood flow is slower in these areas. This means that the result you get may be delayed, and it may be different from the result that you would get from your finger.  The most common alternative sites are: ? Forearm. ? Thigh. ? Palm of the hand. Additional tips  Always keep your supplies with you.  If you have questions or need help, all blood glucose meters have a 24-hour "hotline" number that you can call. You may also contact your health care provider.  After you use a few boxes of test strips, adjust (calibrate) your blood glucose meter by following instructions that came with your meter. This information is not intended to replace advice given to you by your health care provider. Make sure you discuss any questions you have with your health care provider. Document Released: 08/20/2003 Document Revised: 03/06/2016 Document Reviewed: 01/27/2016 Elsevier Interactive Patient Education  2017 [employer].

## 2017-08-05 MED FILL — LOSARTAN POTASSIUM 100 MG T: 100 | 30 days supply | Qty: 30 | Fill #1

## 2017-08-05 MED FILL — ?ATORVASTATIN 40MG TABLET: 40 | 30 days supply | Qty: 30 | Fill #4

## 2017-08-05 MED FILL — ?OMEPRAZOLE DR 20MG CAPSULE: 20 | 30 days supply | Qty: 30 | Fill #4

## 2017-08-05 MED FILL — GABAPENTIN 300 MG CAPSULE: 300 | 30 days supply | Qty: 30 | Fill #1

## 2017-08-05 MED FILL — INVOKANA 100 MG TABLET: 100 | 30 days supply | Qty: 30 | Fill #4

## 2017-08-06 NOTE — Progress Notes (Signed)
Subjective:  Patient ID: [name] [name], male    DOB: 01/04/1961  Age: 56 y.o. MRN: [id number]  CC: Diabetes and Shoulder Pain   HPI [name] [name] presents for follow up of DM, HTN.  Past medical history of diabetes, hypertension, hyperlipidemia, alcohol abuse.  DM: symptoms-paresthesias of the feet, onset 2 years ago.  Patient denies foot ulcerations, nausea, polydipsia, polyuria, visual disturbances, vomitting and weight loss.  Evaluation to date has been included: fasting blood sugar and hemoglobin A1C.  Home sugars: is inconsistent with checking blood glucose. He does not bring his glucometer or blood glucose log to office with him. Treatment to date: invokana. History of HTN. He is not exercising and is not adherent to low salt diet.  He does not check BP at home. He reports not taking BP medication prior to office visit. Cardiac symptoms none. Patient denies chest pain, chest pressure/discomfort, claudication, dyspnea, near-syncope and syncope.  Cardiovascular risk factors: diabetes mellitus, dyslipidemia, hypertension, male gender, sedentary lifestyle and smoking/ tobacco exposure, etoh dependence. He declines to speak with LCSW or receive counseling resources to help him quit at this time. Current smoker 1/2 pack per week. He is not ready to quit at this time. Use of agents associated with hypertension: none. History of target organ damage: none. Patient complains of arthralgias for which has been present for 5 months. Pain is located in the left shoulder(s), is described as aching, and is intermittent . Pain moderately severe. Associated symptoms include: decreased range of motion and difficulty grip. Xray previously ordered, he denies following up. He reports following up with orthopedic specialist and receiving injections, which helped. Patient taking NSAIDs.  History of heavy lifting. He denies any paresthesias. Related to injury:  History of rotator cuff injury to right  shoulder.  Outpatient Medications Prior to Visit  Medication Sig Dispense Refill  . albuterol (VENTOLIN HFA) 108 (90 Base) MCG/ACT inhaler Inhale 2 puffs into the lungs every 6 (six) hours as needed for wheezing or shortness of breath. (Patient not taking: Reported on 07/09/2017) 18 each 0  . aspirin EC 81 MG tablet Take 1 tablet (81 mg total) by mouth daily. (Patient not taking: Reported on 07/09/2017) 30 tablet 11  . atorvastatin (LIPITOR) 40 MG tablet Take 1 tablet (40 mg total) by mouth daily. 30 tablet 11  . bacitracin 500 UNIT/GM ointment Apply 1 application 2 (two) times daily topically. For 7 days. (Patient not taking: Reported on 07/09/2017) 15 g 0  . Blood Glucose Monitoring Suppl (TRUE METRIX METER) w/Device KIT 1 each by Does not apply route as needed. 1 kit 0  . canagliflozin (INVOKANA) 100 MG TABS tablet Take 1 tablet (100 mg total) by mouth daily before breakfast. 30 tablet 11  . cephALEXin (KEFLEX) 500 MG capsule Take 1 capsule (500 mg total) 4 (four) times daily by mouth. (Patient not taking: Reported on 07/09/2017) 20 capsule 0  . citalopram (CELEXA) 20 MG tablet Take 20 mg by mouth daily.    Marland Kitchen gabapentin (NEURONTIN) 300 MG capsule Take 1 capsule (300 mg total) at bedtime by mouth. (Patient not taking: Reported on 07/09/2017) 30 capsule 2  . glucose blood (TRUE METRIX BLOOD GLUCOSE TEST) test strip 1 each by Other route 3 (three) times daily. 100 each 11  . losartan (COZAAR) 100 MG tablet Take 1 tablet (100 mg total) daily by mouth. 90 tablet 3  . Multiple Vitamins-Minerals (MULTIVITAMIN ADULT) TABS Take 1 tablet by mouth daily. 30 tablet 11  . omeprazole (  PRILOSEC) 20 MG capsule TAKE ONE CAPSULE BY MOUTH DAILY 90 capsule 3  . testosterone cypionate (DEPOTESTOSTERONE CYPIONATE) 200 MG/ML injection Inject 1 mL (200 mg total) into the muscle every 14 (fourteen) days. For PASS (Patient not taking: Reported on 07/09/2017) 1 mL 5  . TRUEPLUS LANCETS 28G MISC 1 each by Does not apply route 3  (three) times daily. 100 each 11  . zolpidem (AMBIEN) 10 MG tablet Take 10 mg by mouth at bedtime as needed for sleep. At 2000    . ibuprofen (ADVIL,MOTRIN) 600 MG tablet Take 1 tablet (600 mg total) every 8 (eight) hours as needed by mouth for moderate pain (Take with food.). (Patient not taking: Reported on 07/09/2017) 40 tablet 0   No facility-administered medications prior to visit.     ROS Review of Systems  Constitutional: Negative.   Eyes: Negative.   Respiratory: Negative.   Cardiovascular: Negative.   Gastrointestinal: Negative.   Genitourinary: Negative.   Musculoskeletal: Positive for arthralgias.  Neurological: Negative.   Psychiatric/Behavioral: Behavioral problem: history of alcoholism.    Objective:  BP (!) 146/86 (BP Location: Left Arm, Patient Position: Sitting, Cuff Size: Normal)   Pulse 98   Temp 99 F (37.2 C) (Oral)   Resp 18   Ht 5' 11"  (1.803 m)   Wt 174 lb (78.9 kg)   SpO2 99%   BMI 24.27 kg/m   BP/Weight 08/03/2017 07/20/2017 75/08/6999  Systolic BP 749 449 675  Diastolic BP 86 96 85  Wt. (Lbs) 174 178 174  BMI 24.27 24.83 24.27    Physical Exam  Constitutional: He appears well-developed and well-nourished.  HENT:  Head: Normocephalic and atraumatic.  Right Ear: External ear normal.  Left Ear: External ear normal.  Nose: Nose normal.  Mouth/Throat: Oropharynx is clear and moist.  Eyes: Conjunctivae are normal. Pupils are equal, round, and reactive to light.  Neck: Normal range of motion. Neck supple. No JVD present.  Cardiovascular: Normal rate, regular rhythm, normal heart sounds and intact distal pulses.  Pulmonary/Chest: Effort normal and breath sounds normal.  Abdominal: Soft. Bowel sounds are normal. There is no tenderness.  Musculoskeletal:       Left shoulder: He exhibits decreased range of motion (with lateral extension.) and pain.  Skin: Skin is warm and dry.  Psychiatric: His mood appears anxious. He is hyperactive. He expresses  no homicidal and no suicidal ideation. He expresses no suicidal plans and no homicidal plans.  Nursing note and vitals reviewed.   Assessment & Plan:   1. Chronic left shoulder pain Rec.f/u with ortho.specialist. Will order xray imaging and referral to PT. - Ambulatory referral to Physical Therapy - DG Shoulder Left; Future - diclofenac (VOLTAREN) 50 MG EC tablet; Take 1 tablet (50 mg total) by mouth 2 (two) times daily as needed for moderate pain. Take with food.  Dispense: 40 tablet; Refill: 0  2. Uncontrolled type 2 diabetes mellitus without complication, without long-term current use of insulin (HCC)  - Glucose (CBG) - Urinalysis Dipstick - Ambulatory referral to Ophthalmology    Follow-up: Return in about 3 months (around 11/01/2017) for DM.   Alfonse Spruce FNP

## 2017-08-18 ENCOUNTER — Other Ambulatory Visit: Payer: Self-pay

## 2017-08-20 ENCOUNTER — Other Ambulatory Visit: Payer: Self-pay | Admitting: *Deleted

## 2017-08-20 MED ORDER — PREGABALIN 100 MG PO CAPS: 100.0000 mg | ORAL_CAPSULE | Freq: Two times a day (BID) | ORAL | 1 refills | Status: DC

## 2017-08-20 NOTE — Telephone Encounter (Signed)
PRINTED FOR PASS PROGRAM 

## 2017-08-26 NOTE — Progress Notes (Signed)
[place]  [phone] [phone]) [phone]  Clinic Follow Up Note   Patient Care Team: Alfonse Spruce, FNP as PCP - General (Family Medicine) Armbruster, Carlota Raspberry, MD as Consulting Physician (Gastroenterology) 08/30/2017   CHIEF COMPLAINTS:  F/u test results   HISTORY OF PRESENTING ILLNESS: 05/20/17 [name] [name] 56 y.o. male is here because of possible hemachromatosis. He was referred by Dr. Havery Moros when he found iron sat to be elevated at 65% during work up for elevated transaminases on 04/02/2017. The patient has history of hepatitis C in 2016 treated with Harvoni with eradication per Dr. Novella Olive. He does not know how he was infected with HCV. He received Hep A and Hep B vaccines in 2016. He has not had a blood transfusion. No IV drug use. He denies family history of liver disease or hemachromatosis. He takes a multivitamin but does not know the iron content. He eats a regular diet heavy in red meat. He drinks 6 beers per day x17 years, no wine or liquor. He smokes 1.5 PPD x35 years.   Today he reports ongoing fatigue and decreased motivation that began when he was diagnosed with depression years ago. He has chronic stable diabetic peripheral neuropathy to his feet. He has bilateral shoulder arthralgia related to rotator cuff tears. He denies weakness, lethargy, headaches, nausea, vomiting, constipation, blood in stool, cough, chest pain, shortness of breath. She is up to date on colonoscopy and endoscopy for barrett's esophagus.    INTERVAL HISTORY:  [name] [name] is here for a follow up. He presents to the clinic today. He reports that he is doing well overall. He reports that he is able to taste iron at this time. He denies taking Rx Iron, but notes that he takes a multivitamin. He is unable to give blood to [facility] due to prior hx of Hepatitis C. He notes that he will quit drinking starting into the new year.   Since his last visit to the  office, he underwent a MR abdomen without contrast on 07/26/2017 with results showing: IMPRESSION: 1. No imaging findings to suggest hemochromatosis. 2. Hepatic steatosis. 3. Slightly nodular contour of the liver, which could indicate early changes of cirrhosis.. Prior to, he had a colonoscopy completed on 07/09/2017 with pathology results showing: 1. Surgical [P], ascending with benign colonic mucosa with focal ulceration, lamina propria hemorrhage and rare neutrophil. No evidence of microscopic colitis. No high grade dysplasia or malignancy identified. 2. Surgical [P], sigmoid, recto-sig, polyp (2) with hyperplastic polyp (2 of 2 fragments). No high grade dysplasia or malignancy identified. Microscopic Comment 1. The histologic features are non-specific and can be seen in low grade ischemia, self-limited infectious processes, and early inflammatory bowel disease.   On review of systems, pt denies any other symptoms.     MEDICAL HISTORY:  Past Medical History:  Diagnosis Date  . Barrett's esophagus dx 2016  . Bronchitis   . Depression   . Diabetes ([place])   . ED (erectile dysfunction)   . GERD (gastroesophageal reflux disease)   . Hepatitis C   . Neuropathy    SURGICAL HISTORY: Past Surgical History:  Procedure Laterality Date  . APPENDECTOMY    . COLONOSCOPY N/A 02/16/2014   Procedure: COLONOSCOPY;  Surgeon: Gatha Mayer, MD;  Location: [facility];  Service: Endoscopy;  Laterality: N/A;  . COLONOSCOPY    . TONSILLECTOMY     SOCIAL HISTORY: Social History   Socioeconomic History  . Marital status: Single  Spouse name: Not on file  . Number of children: Not on file  . Years of education: Not on file  . Highest education level: Not on file  Social Needs  . Financial resource strain: Not on file  . Food insecurity - worry: Not on file  . Food insecurity - inability: Not on file  . Transportation needs - medical: Not on file  . Transportation needs - non-medical: Not on file    Occupational History  . Not on file  Tobacco Use  . Smoking status: Former Smoker    Packs/day: 0.50    Years: 35.00    Pack years: 17.50    Types: Cigarettes  . Smokeless tobacco: Never Used  Substance and Sexual Activity  . Alcohol use: Yes    Alcohol/week: 16.2 oz    Types: 6 Cans of beer, 21 Standard drinks or equivalent per week    Comment: 6 beers per day x17 years (2001)  . Drug use: No  . Sexual activity: Not on file  Other Topics Concern  . Not on file  Social History Narrative  . Not on file   FAMILY HISTORY: Family History  Problem Relation Age of Onset  . Cancer Mother        breast  . Diabetes Mother   . Heart disease Maternal Grandfather   . Pancreatic cancer Paternal Grandmother   . Stroke Father   . Colon cancer Neg Hx   . Stomach cancer Neg Hx   . Rectal cancer Neg Hx   . Esophageal cancer Neg Hx   Negative family history of hemochromatosis or liver disease.  ALLERGIES:  has No Known Allergies.  MEDICATIONS:  Current Outpatient Medications  Medication Sig Dispense Refill  . albuterol (VENTOLIN HFA) 108 (90 Base) MCG/ACT inhaler Inhale 2 puffs into the lungs every 6 (six) hours as needed for wheezing or shortness of breath. 18 each 0  . aspirin EC 81 MG tablet Take 1 tablet (81 mg total) by mouth daily. 30 tablet 11  . atorvastatin (LIPITOR) 40 MG tablet Take 1 tablet (40 mg total) by mouth daily. 30 tablet 11  . Blood Glucose Monitoring Suppl (TRUE METRIX METER) w/Device KIT 1 each by Does not apply route as needed. 1 kit 0  . canagliflozin (INVOKANA) 100 MG TABS tablet Take 1 tablet (100 mg total) by mouth daily before breakfast. 30 tablet 11  . citalopram (CELEXA) 20 MG tablet Take 20 mg by mouth daily.    . diclofenac (VOLTAREN) 50 MG EC tablet Take 1 tablet (50 mg total) by mouth 2 (two) times daily as needed for moderate pain. Take with food. 40 tablet 0  . gabapentin (NEURONTIN) 300 MG capsule Take 1 capsule (300 mg total) at bedtime by mouth.  30 capsule 2  . glucose blood (TRUE METRIX BLOOD GLUCOSE TEST) test strip 1 each by Other route 3 (three) times daily. 100 each 11  . losartan (COZAAR) 100 MG tablet Take 1 tablet (100 mg total) daily by mouth. 90 tablet 3  . Multiple Vitamins-Minerals (MULTIVITAMIN ADULT) TABS Take 1 tablet by mouth daily. 30 tablet 11  . omeprazole (PRILOSEC) 20 MG capsule TAKE ONE CAPSULE BY MOUTH DAILY 90 capsule 3  . TRUEPLUS LANCETS 28G MISC 1 each by Does not apply route 3 (three) times daily. 100 each 11  . zolpidem (AMBIEN) 10 MG tablet Take 10 mg by mouth at bedtime as needed for sleep. At 2000    . ibuprofen (ADVIL,MOTRIN) 600 MG  tablet Take 1 tablet by mouth every 8 (eight) hours as needed.  0  . testosterone cypionate (DEPOTESTOSTERONE CYPIONATE) 200 MG/ML injection Inject 1 mL (200 mg total) into the muscle every 14 (fourteen) days. For PASS (Patient not taking: Reported on 07/09/2017) 1 mL 5   No current facility-administered medications for this visit.    REVIEW OF SYSTEMS:   Constitutional: Denies fevers, chills or abnormal night sweats Eyes: Denies blurriness of vision, double vision or watery eyes Ears, nose, mouth, throat, and face: Denies mucositis or sore throat Respiratory: Denies cough, dyspnea or wheezes Cardiovascular: Denies palpitation, chest discomfort or lower extremity swelling Gastrointestinal:  Denies nausea, heartburn or change in bowel habits  Skin: Denies abnormal skin rashes Lymphatics: Denies new lymphadenopathy or easy bruising Neurological:negative MSK: (+) pain in left rotator cuff Behavioral/Psych: Mood is stable, no new changes  All other systems were reviewed with the patient and are negative.  PHYSICAL EXAMINATION:  ECOG PERFORMANCE STATUS: 0 - Asymptomatic  Vitals:   08/30/17 0839  BP: (!) 164/83  Pulse: 92  Resp: 18  Temp: 98.3 F (36.8 C)  SpO2: 99%   Filed Weights   08/30/17 0839  Weight: 175 lb 3.2 oz (79.5 kg)     GENERAL:alert, no distress  and comfortable SKIN: skin color, texture, turgor are normal, no rashes or significant lesions EYES: normal, conjunctiva are pink and non-injected, sclera clear OROPHARYNX:no exudate, no erythema and lips, buccal mucosa, and tongue normal  NECK: supple, thyroid normal size, non-tender, without nodularity LYMPH:  no palpable cervical or supraclavicular lymphadenopathy  LUNGS: clear to auscultation bilaterally with normal breathing effort HEART: regular rate & rhythm and no murmurs and no lower extremity edema ABDOMEN:abdomen soft, round, non-tender and normal bowel sounds. No palpable masses. No ascites  Musculoskeletal:no cyanosis of digits and no clubbing  PSYCH: alert & oriented x 3 with fluent speech NEURO: no focal motor deficits  LABORATORY DATA:  I have reviewed the data as listed CBC Latest Ref Rng & Units 04/02/2017 02/18/2016 01/23/2016  WBC 3.4 - 10.8 x10E3/uL 8.1 8.4 12.3(H)  Hemoglobin 13.0 - 17.7 g/dL 17.4 15.2 15.4  Hematocrit 37.5 - 51.0 % 48.6 44.7 44.9  Platelets 150 - 379 x10E3/uL 210 192 235.0   CMP Latest Ref Rng & Units 04/02/2017 08/18/2016 01/02/2016  Glucose 65 - 99 mg/dL 118(H) 132(H) 153(H)  BUN 6 - 24 mg/dL _0 Creatinine 0.76 - 1.27 mg/dL 0.69(L) 0.68(L) 0.87  Sodium 134 - 144 mmol/L 137 138 136  Potassium 3.5 - 5.2 mmol/L 4.8 4.5 4.1  Chloride 96 - 106 mmol/L 97 104 102  CO2 20 - 29 mmol/L _1 Calcium 8.7 - 10.2 mg/dL 9.7 9.4 9.2  Total Protein 6.0 - 8.5 g/dL 7.9 7.5 6.6  Total Bilirubin 0.0 - 1.2 mg/dL 1.1 0.6 0.4  Alkaline Phos 39 - 117 IU/L 71 56 61  AST 0 - 40 IU/L 50(H) 36(H) 26  ALT 0 - 44 IU/L 68(H) 46 38   Hemochromatosis Gene Comment   Comment: Result: CARRIER  Single mutation (H63D) identified    Results for VASHAUN, OSMON "[name] [name]" (MRN [id number]) as of 08/30/2017 21:22  Ref. Range 05/04/2017 12:09 05/20/2017 12:25  Iron Latest Ref Range: 42 - 163 ug/dL 225 (H) 109  UIBC Latest Ref Range: 117 - 376 ug/dL 123 227  TIBC Latest Ref  Range: 202 - 409 ug/dL 348 336  %SAT Latest Ref Range: 20 - 55 % 65 (H) 32  Ferritin  Latest Ref Range: 22.0 - 322.0 ng/mL 167.4      PATHOLOGY RESULTS:  Colonoscopy, 07/09/2017   IMPRESSION: 1. Surgical [P], ascending with benign colonic mucosa with focal ulceration, lamina propria hemorrhage and rare neutrophil. No evidence of microscopic colitis. No high grade dysplasia or malignancy identified. 2. Surgical [P], sigmoid, recto-sig, polyp (2) with hyperplastic polyp (2 of 2 fragments). No high grade dysplasia or malignancy identified. Microscopic Comment 1. The histologic features are non-specific and can be seen in low grade ischemia, self-limited infectious processes, and early inflammatory bowel disease.  RADIOGRAPHIC STUDIES: I have personally reviewed the radiological images as listed and agreed with the findings in the report. No results found.   Since his last visit to the office, he underwent a MR abdomen without contrast on 07/26/2017 with results showing: IMPRESSION: 1. No imaging findings to suggest hemochromatosis. 2. Hepatic steatosis. 3. Slightly nodular contour of the liver, which could indicate early changes of cirrhosis..    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  -The patient has no family history of liver disease or hemachromatosis, no history of blood transfusion. He does take multivitamin, but he does not think the multivitamin contains iron. -His previous lab showed moderately elevated serum iron and transferrin saturation, but a normal ferritin. Repeat iron level was normal  -I previously suggested he holds any multivitamin or  supplements that contain iron. 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 Other mutation of C282Y nad S65C were negative . Pt with H63D carrier usually do not present with hemachromatosis. -However he does have some medical issues which could be related to hemachromatosis such as DM, low testosterone levels and hepatitis.  I would like to do further work up to definitively rule out Hemochromatosis. I suggest a MRI of liver to evaluate iron oeverload in liver.  -Reviewed his abdominal liver MRI results, which showed no evidence of iron overload in the liver -His workup so far is negative for hemochromatosis.  I recommend him to continue avoiding iron supplement and iron rich food, and to monitor his serum iron level and ferritin every 6-12 months. He will f/u with PCP    2. Hepatitis C, DM, Barrett's esophagus -Hep C eradicated with Harvoni in 2016 -He is due for his next endoscopy next year for BE  -Colonoscopy on 07/09/17 showed: No high grade dysplasia or malignancy identified. -MR Abdomen without contrast on 07/26/2017 that showed: hepatic steatosis. Slightly nodular contour of the liver, which could indicate early changes of cirrhosis.  -On Invokana for DM, last POC glucose 174 on 04/30/2017 -Continue follow up with PCP   3. Transaminitis, possible early liver cirrhosis  -he will continue follow-up with Dr. Havery Moros  -I again strongly encouraged him to discontinue alcohol use.     PLAN -Maintain follow up with either his PCP or GI specialist  -Follow up in clinic PRN.      No orders of the defined types were placed in this encounter.  All questions were answered. The patient knows to call the clinic with any problems, questions or concerns. I spent 10 minutes counseling the patient face to face. The total time spent in the appointment  was 15 minutes and more than 50% was on counseling.   Truitt Merle, MD 08/30/2017   This document serves as a  record of services personally performed by Truitt Merle, MD. It was created on her behalf by Steva Colder, a trained medical scribe. The creation of this record is based on the scribe's personal observations and the provider's statements to them.   I have reviewed the above documentation for accuracy and completeness, and I agree with the above.

## 2017-08-30 ENCOUNTER — Telehealth: Payer: Self-pay | Admitting: Hematology

## 2017-08-30 ENCOUNTER — Ambulatory Visit (HOSPITAL_BASED_OUTPATIENT_CLINIC_OR_DEPARTMENT_OTHER): Payer: Self-pay | Admitting: Hematology

## 2017-08-30 ENCOUNTER — Encounter: Payer: Self-pay | Admitting: Hematology

## 2017-08-30 DIAGNOSIS — B1920 Unspecified viral hepatitis C without hepatic coma: Secondary | ICD-10-CM

## 2017-08-30 DIAGNOSIS — Z148 Genetic carrier of other disease: Secondary | ICD-10-CM

## 2017-08-30 NOTE — Telephone Encounter (Signed)
Per 12/31 los. Follow up PRN.

## 2017-09-07 ENCOUNTER — Ambulatory Visit (HOSPITAL_COMMUNITY)
Admission: RE | Admit: 2017-09-07 | Discharge: 2017-09-07 | Disposition: A | Discharge status: Home / Self Care | Payer: Self-pay | Source: Ambulatory Visit | Attending: Family Medicine | Admitting: Family Medicine

## 2017-09-07 DIAGNOSIS — M25612 Stiffness of left shoulder, not elsewhere classified: Secondary | ICD-10-CM | POA: Insufficient documentation

## 2017-09-08 ENCOUNTER — Other Ambulatory Visit: Payer: Self-pay | Admitting: Family Medicine

## 2017-09-08 DIAGNOSIS — M25512 Pain in left shoulder: Principal | ICD-10-CM

## 2017-09-08 DIAGNOSIS — G8929 Other chronic pain: Secondary | ICD-10-CM

## 2017-09-10 ENCOUNTER — Telehealth: Payer: Self-pay | Admitting: *Deleted

## 2017-09-10 NOTE — Telephone Encounter (Signed)
-----   Message from Alfonse Spruce, [place] sent at 09/08/2017  4:08 PM EST ----- Shoulder xray negative for fracture, dislocation, or joint abnormality.  You will be referred to PT and orthopedics.

## 2017-09-10 NOTE — Telephone Encounter (Signed)
Patient verified DOB Patient is aware of no bony abnormalities being noted. Patient has been in contact with PT or Ortho. No further questions at this time.

## 2017-09-16 ENCOUNTER — Ambulatory Visit (HOSPITAL_COMMUNITY)
Admission: EM | Admit: 2017-09-16 | Discharge: 2017-09-16 | Disposition: A | Discharge status: Home / Self Care | Payer: Self-pay | Attending: Internal Medicine | Admitting: Internal Medicine

## 2017-09-16 ENCOUNTER — Encounter (HOSPITAL_COMMUNITY): Payer: Self-pay | Admitting: Emergency Medicine

## 2017-09-16 ENCOUNTER — Ambulatory Visit (INDEPENDENT_AMBULATORY_CARE_PROVIDER_SITE_OTHER): Payer: Self-pay

## 2017-09-16 DIAGNOSIS — M25512 Pain in left shoulder: Secondary | ICD-10-CM

## 2017-09-16 DIAGNOSIS — G8929 Other chronic pain: Secondary | ICD-10-CM

## 2017-09-16 DIAGNOSIS — S2232XA Fracture of one rib, left side, initial encounter for closed fracture: Secondary | ICD-10-CM

## 2017-09-16 MED ORDER — TRAMADOL HCL 50 MG PO TABS: 50.0000 mg | ORAL_TABLET | Freq: Two times a day (BID) | ORAL | 0 refills | Status: DC | PRN

## 2017-09-16 MED ORDER — DICLOFENAC SODIUM 50 MG PO TBEC: 50.0000 mg | DELAYED_RELEASE_TABLET | Freq: Two times a day (BID) | ORAL | 0 refills | Status: DC

## 2017-09-16 MED FILL — traMADol HCL 50 MG TABS: 50 | 5 days supply | Qty: 10 | Fill #0

## 2017-09-16 MED FILL — ?DICLOFENAC SOD DR 50 MG TA: 50 | 20 days supply | Qty: 40 | Fill #0

## 2017-09-16 NOTE — Discharge Instructions (Addendum)
As discussed, single rib fracture at the 10th rib. Your lungs are clear without signs of puncture to the lung. Start diclofenac as directed. You can take tramadol for break through pain. However, as we discussed, worries for drowsiness and respiratory depression given you are also on ambien. Use the incentive spirometer to make sure your lungs are expanding adequately.  Follow-up with PCP in 1-2 weeks for recheck.  Monitor for any worsening of symptoms, chest pain, shortness of breath, drowsiness, passing out, go to the emergency department for further evaluation.

## 2017-09-16 NOTE — ED Provider Notes (Signed)
[name] [name]    CSN: [id number] Arrival date & time: 09/16/17  1334     History   Chief Complaint Chief Complaint  Patient presents with  . Fall  . Rib Injury    HPI [name] [name] is a 57 y.o. male.   57 year old male comes in for left mid back pain after falling from standing position and struck on shower ledge 2 days ago.  He states he has severe pain with breathing, moving and coughing.  He has been using heating pads with little relief.  He denies any fever, chills, night sweats.  Denies shortness of breath, wheezing, chest pain.      Past Medical History:  Diagnosis Date  . Barrett's esophagus dx 2016  . Bronchitis   . Depression   . Diabetes ([place])   . ED (erectile dysfunction)   . GERD (gastroesophageal reflux disease)   . Hepatitis C   . Neuropathy     Patient Active Problem List   Diagnosis Date Noted  . 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 Essential hypertension 04/02/2017  . Hypogonadism male 08/13/2016  . ETOH abuse 06/22/2016  . Low serum testosterone level 01/08/2016  . Uncontrolled type 2 diabetes mellitus without complication, without long-term current use of insulin ([place]) 01/02/2016  . Erectile dysfunction 09/12/2015  . Hepatic cirrhosis ([place]) 01/10/2015  . Chronic hepatitis C without hepatic coma ([place]) 10/31/2014  . Tobacco use disorder 08/03/2013    Past Surgical History:  Procedure Laterality Date  . APPENDECTOMY    . COLONOSCOPY N/A 02/16/2014   Procedure: COLONOSCOPY;  Surgeon: Gatha Mayer, MD;  Location: [facility];  Service: Endoscopy;  Laterality: N/A;  . COLONOSCOPY    . TONSILLECTOMY         Home Medications    Prior to Admission medications   Medication Sig Start Date End Date Taking? Authorizing Provider  albuterol (VENTOLIN HFA) 108 (90 Base) MCG/ACT inhaler Inhale 2 puffs into the lungs every 6 (six) hours as needed for wheezing or shortness of breath. 04/04/17   Boykin Nearing, MD    aspirin EC 81 MG tablet Take 1 tablet (81 mg total) by mouth daily. 04/02/17   Funches, Adriana Mccallum, MD  atorvastatin (LIPITOR) 40 MG tablet Take 1 tablet (40 mg total) by mouth daily. 04/02/17   Funches, Adriana Mccallum, MD  Blood Glucose Monitoring Suppl (TRUE METRIX METER) w/Device KIT 1 each by Does not apply route as needed. 02/18/16   Boykin Nearing, MD  canagliflozin (INVOKANA) 100 MG TABS tablet Take 1 tablet (100 mg total) by mouth daily before breakfast. 04/02/17   Boykin Nearing, MD  citalopram (CELEXA) 20 MG tablet Take 20 mg by mouth daily.    [provider]  diclofenac (VOLTAREN) 50 MG EC tablet Take 1 tablet (50 mg total) by mouth 2 (two) times daily. Take with food. 09/16/17   Tasia Catchings, Travone Georg V, PA-C  gabapentin (NEURONTIN) 300 MG capsule Take 1 capsule (300 mg total) at bedtime by mouth. 07/08/17   Hairston, Maylon Peppers, FNP  glucose blood (TRUE METRIX BLOOD GLUCOSE TEST) test strip 1 each by Other route 3 (three) times daily. 02/18/16   Funches, Adriana Mccallum, MD  ibuprofen (ADVIL,MOTRIN) 600 MG tablet Take 1 tablet by mouth every 8 (eight) hours as needed. 07/08/17   [provider]  losartan (COZAAR) 100 MG tablet Take 1 tablet (100 mg total) daily by mouth. 07/08/17   Alfonse Spruce, FNP  Multiple Vitamins-Minerals (MULTIVITAMIN ADULT) TABS Take 1  tablet by mouth daily. 04/02/17   Funches, Adriana Mccallum, MD  omeprazole (PRILOSEC) 20 MG capsule TAKE ONE CAPSULE BY MOUTH DAILY 02/22/17   Armbruster, Carlota Raspberry, MD  testosterone cypionate (DEPOTESTOSTERONE CYPIONATE) 200 MG/ML injection Inject 1 mL (200 mg total) into the muscle every 14 (fourteen) days. For PASS Patient not taking: Reported on 07/09/2017 02/25/17   Boykin Nearing, MD  traMADol (ULTRAM) 50 MG tablet Take 1 tablet (50 mg total) by mouth every 12 (twelve) hours as needed for severe pain. 09/16/17   Tasia Catchings, Cecia Egge V, PA-C  TRUEPLUS LANCETS 28G MISC 1 each by Does not apply route 3 (three) times daily. 02/18/16   Funches, Adriana Mccallum, MD  zolpidem  (AMBIEN) 10 MG tablet Take 10 mg by mouth at bedtime as needed for sleep. At 2000    [provider]    Family History Family History  Problem Relation Age of Onset  . Cancer Mother        breast  . Diabetes Mother   . Heart disease Maternal Grandfather   . Pancreatic cancer Paternal Grandmother   . Stroke Father   . Colon cancer Neg Hx   . Stomach cancer Neg Hx   . Rectal cancer Neg Hx   . Esophageal cancer Neg Hx     Social History Social History   Tobacco Use  . Smoking status: Former Smoker    Packs/day: 0.50    Years: 35.00    Pack years: 17.50    Types: Cigarettes  . Smokeless tobacco: Never Used  Substance Use Topics  . Alcohol use: Yes    Alcohol/week: 16.2 oz    Types: 6 Cans of beer, 21 Standard drinks or equivalent per week    Comment: 6 beers per day x17 years (2001)  . Drug use: No     Allergies   Patient has no known allergies.   Review of Systems Review of Systems  Reason unable to perform ROS: See HPI as above.     Physical Exam Triage Vital Signs ED Triage Vitals [09/16/17 1420]  Enc Vitals Group     BP (!) 167/84     Pulse Rate 89     Resp 16     Temp 99.1 F (37.3 C)     Temp Source Oral     SpO2 96 %     Weight 175 lb (79.4 kg)     Height '5\' 11"'  (1.803 m)     Head Circumference      Peak Flow      Pain Score 9     Pain Loc      Pain Edu?      Excl. in [place]?    No data found.  Updated Vital Signs BP (!) 167/84   Pulse 89   Temp 99.1 F (37.3 C) (Oral)   Resp 16   Ht '5\' 11"'  (1.803 m)   Wt 175 lb (79.4 kg)   SpO2 96%   BMI 24.41 kg/m    Physical Exam  Constitutional: He is oriented to person, place, and time. He appears well-developed and well-nourished. No distress.  HENT:  Head: Normocephalic and atraumatic.  Eyes: Conjunctivae are normal. Pupils are equal, round, and reactive to light.  Cardiovascular: Normal rate, regular rhythm and normal heart sounds. Exam reveals no gallop and no friction rub.  No  murmur heard. Pulmonary/Chest: Effort normal and breath sounds normal. He has no wheezes. He has no rales.  Musculoskeletal:  No obvious contusions, erythema, increased  warmth seen.  No tenderness on palpation of the spinous processes.  Tenderness on palpation of left mid back, around lower rib area.  Range of motion deferred due to pain.    Neurological: He is alert and oriented to person, place, and time.  Skin: Skin is warm and dry.     UC Treatments / Results  Labs (all labs ordered are listed, but only abnormal results are displayed) Labs Reviewed - No data to display  EKG  EKG Interpretation None       Radiology Dg Ribs Unilateral W/chest Left  Result Date: 09/16/2017 CLINICAL DATA:  Fall.  Posterior left rib pain. EXAM: LEFT RIBS AND CHEST - 3+ VIEW COMPARISON:  Chest x-ray 10/23/2014 FINDINGS: The lungs are clear without focal pneumonia, edema, pneumothorax or pleural effusion. The cardiopericardial silhouette is within normal limits for size. Oblique views of the left ribs were obtained. Radio-opaque marker has been placed on the skin at the site of patient concern. Acute fracture of the posterolateral left tenth rib noted. IMPRESSION: Acute nondisplaced fracture posterolateral left tenth rib. Electronically Signed   By: Misty Stanley M.D.   On: 09/16/2017 14:56    Procedures Procedures (including critical care time)  Medications Ordered in UC Medications - No data to display   Initial Impression / Assessment and Plan / UC Course  I have reviewed the triage vital signs and the nursing notes.  Pertinent labs & imaging results that were available during my care of the patient were reviewed by me and considered in my medical decision making (see chart for details).    Discussed x-ray results with patient.  Start diclofenac as directed.  Tramadol for breakthrough pain.  Discussed with patient concerns for respiratory depression with tramadol and Ambien use, to use  sparingly.  Incentive spirometer provided, patient  to use as directed.  Discussed with patient repeat fractures can take 6-8 weeks to completely resolve.  Encouraged cessation of smoking and alcohol use.  Follow-up with PCP and 1-2 weeks for recheck.  Return precautions given.  Patient expresses understanding and agrees to plan.  Final Clinical Impressions(s) / UC Diagnoses   Final diagnoses:  Closed fracture of one rib of left side, initial encounter    ED Discharge Orders        Ordered    diclofenac (VOLTAREN) 50 MG EC tablet  2 times daily     09/16/17 1544    traMADol (ULTRAM) 50 MG tablet  Every 12 hours PRN     09/16/17 1544        Ok Edwards, PA-C 09/16/17 1553

## 2017-09-16 NOTE — ED Triage Notes (Signed)
PT reports he fell from a standing position and struck left side/ back on shower ledge. This occurred Tuesday. PT reports severe pain with breathing, moving, and coughing.

## 2017-09-16 NOTE — ED Notes (Signed)
Pt given Incentive Spirometer for home use due to his broken rib.  Pt was instructed on use.  He states his mother was a RT so he was familiar with the tool.  Pt stated understanding.

## 2017-09-20 ENCOUNTER — Ambulatory Visit: Payer: Self-pay | Attending: Family Medicine

## 2017-09-20 ENCOUNTER — Other Ambulatory Visit: Payer: Self-pay

## 2017-09-20 DIAGNOSIS — G8929 Other chronic pain: Secondary | ICD-10-CM | POA: Insufficient documentation

## 2017-09-20 DIAGNOSIS — M25612 Stiffness of left shoulder, not elsewhere classified: Secondary | ICD-10-CM | POA: Insufficient documentation

## 2017-09-20 DIAGNOSIS — M25512 Pain in left shoulder: Secondary | ICD-10-CM | POA: Insufficient documentation

## 2017-09-20 NOTE — Patient Instructions (Signed)
Strengthening: Resisted Internal Rotation   Hold tubing in left hand, elbow at side and forearm out. Rotate forearm in across body. Repeat ____ times per set. Do ____ sets per session. Do ____ sessions per day.  http://orth.exer.us/830   Copyright  VHI. All rights reserved.  Strengthening: Resisted External Rotation   Hold tubing in right hand, elbow at side and forearm across body. Rotate forearm out. Repeat ____ times per set. Do ____ sets per session. Do ____ sessions per day.  http://orth.exer.us/828   Copyright  VHI. All rights reserved.  Strengthening: Resisted Flexion   Hold tubing with left arm at side. Pull forward and up. Move shoulder through pain-free range of motion. Repeat ____ times per set. Do ____ sets per session. Do ____ sessions per day.  http://orth.exer.us/824   Copyright  VHI. All rights reserved.  Strengthening: Resisted Extension   Hold tubing in right hand, arm forward. Pull arm back, elbow straight.                                            FOR ALL EXERCISE 1-2X/DAY 10-20 REPS  Repeat ____ times per set. Do ____ sets per session. Do ____ sessions per day.  http://orth.exer.us/832   Copyright  VHI. All rights reserved.

## 2017-09-20 NOTE — Therapy (Addendum)
[place], [place], [address] Phone: [phone]   Fax:  [phone]  Physical Therapy Evaluation/Discharge  Patient Details  Name: MAN EFFERTZ MRN: [id number] Date of Birth: 1960-09-22 Referring Provider: Dorcas Mcmurray , MD   Encounter Date: 09/20/2017  PT End of Session - 09/20/17 1157    Visit Number  1    Number of Visits  12    Date for PT Re-Evaluation  10/29/17    Authorization Type  CAFA    PT Start Time  1146    PT Stop Time  1230    PT Time Calculation (min)  44 min    Activity Tolerance  Patient tolerated treatment well;Patient limited by pain    Behavior During Therapy  [facility] for tasks assessed/performed       Past Medical History:  Diagnosis Date  . Barrett's esophagus dx 2016  . Bronchitis   . Depression   . Diabetes ([place])   . ED (erectile dysfunction)   . GERD (gastroesophageal reflux disease)   . Hepatitis C   . Neuropathy     Past Surgical History:  Procedure Laterality Date  . APPENDECTOMY    . COLONOSCOPY N/A 02/16/2014   Procedure: COLONOSCOPY;  Surgeon: Gatha Mayer, MD;  Location: [facility];  Service: Endoscopy;  Laterality: N/A;  . COLONOSCOPY    . TONSILLECTOMY      There were no vitals filed for this visit.   Subjective Assessment - 09/20/17 1152    Subjective  He reports LT shoulder pain starting 11/2016.  He reports injury with fall on shoulder .    He has had 2 injections with benefit     Pertinent History  fell and broke rib , RT shoulder injury in past    Currently in Pain?  Yes    Pain Score  5     Pain Location  Shoulder    Pain Orientation  Left;Posterior;Lateral    Pain Descriptors / Indicators  Aching;Dull    Pain Type  Chronic pain    Pain Onset  More than a month ago    Pain Frequency  Intermittent    Aggravating Factors   lying on shoulder, lifting to side, twisting arm    Pain Relieving Factors  rest,     Multiple Pain Sites  -- rid injuryu  from fall, not addressed         [facility] PT Assessment - 09/20/17 0001      Assessment   Medical Diagnosis  chronic left shoulder pain    Referring Provider  Dorcas Mcmurray , MD    Onset Date/Surgical Date  -- 11/2017    Hand Dominance  Right    Next MD Visit  As needed    Prior Therapy  No      Precautions   Precaution Comments  decr rpes and weight at gym bilaterally      Restrictions   Weight Bearing Restrictions  No      Balance Screen   Has the patient fallen in the past 6 months  Yes    How many times?  1    Has the patient had a decrease in activity level because of a fear of falling?   No    Is the patient reluctant to leave their home because of a fear of falling?   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 Status  Within Functional Limits for tasks assessed      Observation/Other Assessments   Focus on Therapeutic Outcomes (FOTO)   43% limited      ROM / Strength   AROM / PROM / Strength  AROM;PROM;Strength      AROM   AROM Assessment Site  Shoulder    Right/Left Shoulder  Right;Left    Right Shoulder Flexion  120 Degrees    Right Shoulder ABduction  130 Degrees    Right Shoulder Internal Rotation  25 Degrees    Right Shoulder External Rotation  45 Degrees    Right Shoulder Horizontal ABduction  5 Degrees    Right Shoulder Horizontal  ADduction  105 Degrees    Left Shoulder Flexion  140 Degrees    Left Shoulder ABduction  135 Degrees    Left Shoulder Internal Rotation  90 Degrees    Left Shoulder External Rotation  75 Degrees    Left Shoulder Horizontal ABduction  20 Degrees    Left Shoulder Horizontal ADduction  105 Degrees      PROM   PROM Assessment Site  Shoulder      Strength   Overall Strength Comments  ALl UE testing normal             Objective measurements completed on examination: See above  findings.              PT Education - 09/20/17 1223    Education provided  Yes    Education Details  poc, ROCKWOOD , limit or stop overhead lifting    Person(s) Educated  Patient    Methods  Explanation;Demonstration;Verbal cues;Handout    Comprehension  Returned demonstration;Verbalized understanding       PT Short Term Goals - 09/20/17 1229      PT SHORT TERM GOAL #1   Title  He will be independent with initial HEP    Time  2    Period  Weeks    Status  New      PT SHORT TERM GOAL #2   Title  He will report pain decr 25% or more    Time  3    Period  Weeks    Status  New        PT Long Term Goals - 09/20/17 1230      PT LONG TERM GOAL #1   Title  He will be independent with all hEP issued    Time  6    Period  Weeks    Status  New      PT LONG TERM GOAL #2   Title  He will return to gym with no pain LT shoulder all activity though with less weight    Time  6    Period  Weeks    Status  New      PT LONG TERM GOAL #3   Title  He will report no pain at rest    Time  6    Period  Weeks    Status  New      PT LONG TERM GOAL #4   Title  he will have full ROM without pain     Time  6    Period  Weeks    Status  New      PT LONG TERM GOAL #5   Title  He will be able to sleep on LT side without pain    Time  6    Period  Weeks    Status  New      Additional Long Term Goals   Additional Long Term Goals  Yes      PT LONG TERM GOAL #6   Title  FOTO score improved to <20% limited    Time  6    Period  Weeks    Status  New             Plan - 09/20/17 1224    Clinical Impression Statement  Mr Veal reports 6 months or more of LT shoulder pain post fall. He is better with injections. He has better ROM of LT shoulder than RT and normal strnegth. He gets scatcing pain with reaching and lifting at gym so I adviced him to stop overhead lifting and issued rockwood. Below shoulder height exer at gym ok.     History and Personal Factors relevant  to plan of care:  RT should injury    Clinical Presentation  Stable    Clinical Decision Making  Low    Rehab Potential  Good    PT Frequency  2x / week    PT Duration  6 weeks    PT Treatment/Interventions  Cryotherapy;Iontophoresis 52m/ml Dexamethasone;Taping;Manual techniques;Therapeutic exercise;Moist Heat;Ultrasound    PT Next Visit Plan  [facility] rockwood, STW and modalities ,  tape    PT Home Exercise Plan  rockwood    Consulted and Agree with Plan of Care  Patient       Patient will benefit from skilled therapeutic intervention in order to improve the following deficits and impairments:  Pain, Decreased activity tolerance  Visit Diagnosis: Chronic left shoulder pain  Stiffness of left shoulder joint     Problem List Patient Active Problem List   Diagnosis Date Noted  . 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 Essential hypertension 04/02/2017  . Hypogonadism male 08/13/2016  . ETOH abuse 06/22/2016  . Low serum testosterone level 01/08/2016  . Uncontrolled type 2 diabetes mellitus without complication, without long-term current use of insulin ([place] 01/02/2016  . Erectile dysfunction 09/12/2015  . Hepatic cirrhosis ([place] 01/10/2015  . Chronic hepatitis C without hepatic coma ([place] 10/31/2014  . Tobacco use disorder 08/03/2013    CDarrel Hoover PT 09/20/2017, 12:34 PM  [facility] [address]Phone: [phone]  Fax:  [phone] Name: GLAWRANCE WIEDEMANNMRN: [id number] of Birth: [birth year]PHYSICAL THERAPY DISCHARGE SUMMARY  Visits from Start of Care:  Current functional level related to goals / functional outcomes: He canceled all appointments due to not able to afford PT   Remaining deficits: See above   Education / Equipment: NA Plan: Patient agrees to discharge.  Patient goals were not met. Patient is being discharged due to financial reasons.   ?????    SPearson Forster PT 11/02/17

## 2017-09-24 ENCOUNTER — Ambulatory Visit: Payer: Self-pay | Admitting: Family Medicine

## 2017-09-24 NOTE — Progress Notes (Deleted)
   Subjective:  Patient ID: [name] [name], male    DOB: 06/29/1961  Age: 57 y.o. MRN: [id number]  CC: No chief complaint on file.   HPI [name] [name] presents for   Outpatient Medications Prior to Visit  Medication Sig Dispense Refill  . albuterol (VENTOLIN HFA) 108 (90 Base) MCG/ACT inhaler Inhale 2 puffs into the lungs every 6 (six) hours as needed for wheezing or shortness of breath. 18 each 0  . aspirin EC 81 MG tablet Take 1 tablet (81 mg total) by mouth daily. 30 tablet 11  . atorvastatin (LIPITOR) 40 MG tablet Take 1 tablet (40 mg total) by mouth daily. 30 tablet 11  . Blood Glucose Monitoring Suppl (TRUE METRIX METER) w/Device KIT 1 each by Does not apply route as needed. 1 kit 0  . canagliflozin (INVOKANA) 100 MG TABS tablet Take 1 tablet (100 mg total) by mouth daily before breakfast. 30 tablet 11  . citalopram (CELEXA) 20 MG tablet Take 20 mg by mouth daily.    . diclofenac (VOLTAREN) 50 MG EC tablet Take 1 tablet (50 mg total) by mouth 2 (two) times daily. Take with food. 40 tablet 0  . gabapentin (NEURONTIN) 300 MG capsule Take 1 capsule (300 mg total) at bedtime by mouth. 30 capsule 2  . glucose blood (TRUE METRIX BLOOD GLUCOSE TEST) test strip 1 each by Other route 3 (three) times daily. 100 each 11  . ibuprofen (ADVIL,MOTRIN) 600 MG tablet Take 1 tablet by mouth every 8 (eight) hours as needed.  0  . losartan (COZAAR) 100 MG tablet Take 1 tablet (100 mg total) daily by mouth. 90 tablet 3  . Multiple Vitamins-Minerals (MULTIVITAMIN ADULT) TABS Take 1 tablet by mouth daily. 30 tablet 11  . omeprazole (PRILOSEC) 20 MG capsule TAKE ONE CAPSULE BY MOUTH DAILY 90 capsule 3  . testosterone cypionate (DEPOTESTOSTERONE CYPIONATE) 200 MG/ML injection Inject 1 mL (200 mg total) into the muscle every 14 (fourteen) days. For PASS (Patient not taking: Reported on 07/09/2017) 1 mL 5  . traMADol (ULTRAM) 50 MG tablet Take 1 tablet (50 mg total) by mouth every 12 (twelve) hours as needed  for severe pain. 10 tablet 0  . TRUEPLUS LANCETS 28G MISC 1 each by Does not apply route 3 (three) times daily. 100 each 11  . zolpidem (AMBIEN) 10 MG tablet Take 10 mg by mouth at bedtime as needed for sleep. At 2000     No facility-administered medications prior to visit.     ROS Review of Systems      Objective:  There were no vitals taken for this visit.  BP/Weight 09/16/2017 08/30/2017 07/02/5519  Systolic BP 802 233 612  Diastolic BP 84 83 86  Wt. (Lbs) 175 175.2 174  BMI 24.41 24.44 24.27     Physical Exam   Assessment & Plan:   There are no diagnoses linked to this encounter.     Follow-up: No Follow-up on file.   Alfonse Spruce FNP

## 2017-09-28 MED FILL — INVOKANA 100 MG TABLET: 100 | 30 days supply | Qty: 30 | Fill #5

## 2017-09-28 MED FILL — ?ATORVASTATIN 40MG TABLET: 40 | 30 days supply | Qty: 30 | Fill #5

## 2017-09-28 MED FILL — GABAPENTIN 300 MG CAPSULE: 300 | 30 days supply | Qty: 30 | Fill #2

## 2017-09-28 MED FILL — LOSARTAN POTASSIUM 100 MG T: 100 | 30 days supply | Qty: 30 | Fill #2

## 2017-09-28 MED FILL — ?OMEPRAZOLE 20 MG CPDR: 20 | 30 days supply | Qty: 30 | Fill #5

## 2017-09-30 ENCOUNTER — Encounter: Payer: Self-pay | Admitting: Physical Therapy

## 2017-10-07 ENCOUNTER — Encounter: Payer: Self-pay | Admitting: Physical Therapy

## 2017-10-14 ENCOUNTER — Encounter: Payer: Self-pay | Admitting: Physical Therapy

## 2017-10-17 ENCOUNTER — Emergency Department (HOSPITAL_COMMUNITY): Payer: Self-pay

## 2017-10-17 ENCOUNTER — Other Ambulatory Visit: Payer: Self-pay

## 2017-10-17 ENCOUNTER — Encounter (HOSPITAL_COMMUNITY): Payer: Self-pay

## 2017-10-17 ENCOUNTER — Emergency Department (HOSPITAL_COMMUNITY)
Admission: EM | Admit: 2017-10-17 | Discharge: 2017-10-17 | Disposition: A | Discharge status: Home / Self Care | Payer: Self-pay | Attending: Emergency Medicine | Admitting: Emergency Medicine

## 2017-10-17 DIAGNOSIS — R079 Chest pain, unspecified: Secondary | ICD-10-CM

## 2017-10-17 DIAGNOSIS — R0789 Other chest pain: Secondary | ICD-10-CM | POA: Insufficient documentation

## 2017-10-17 DIAGNOSIS — S2232XA Fracture of one rib, left side, initial encounter for closed fracture: Secondary | ICD-10-CM

## 2017-10-17 LAB — CBC
HEMATOCRIT: 46.4 % (ref 39.0–52.0)
Hemoglobin: 16.6 g/dL (ref 13.0–17.0)
MCH: 33.9 pg (ref 26.0–34.0)
MCHC: 35.8 g/dL (ref 30.0–36.0)
MCV: 94.9 fL (ref 78.0–100.0)
Platelets: 242 10*3/uL (ref 150–400)
RBC: 4.89 MIL/uL (ref 4.22–5.81)
RDW: 12.4 % (ref 11.5–15.5)
WBC: 7.6 10*3/uL (ref 4.0–10.5)

## 2017-10-17 LAB — BASIC METABOLIC PANEL
Anion gap: 11 (ref 5–15)
BUN: 9 mg/dL (ref 6–20)
CHLORIDE: 97 mmol/L — AB (ref 101–111)
CO2: 27 mmol/L (ref 22–32)
Calcium: 9.2 mg/dL (ref 8.9–10.3)
Creatinine, Ser: 0.66 mg/dL (ref 0.61–1.24)
GFR calc Af Amer: >60 mL/min (ref >60)
GFR calc non Af Amer: >60 mL/min (ref >60)
Glucose, Bld: 143 mg/dL — ABNORMAL HIGH (ref 65–99)
POTASSIUM: 4 mmol/L (ref 3.5–5.1)
SODIUM: 135 mmol/L (ref 135–145)

## 2017-10-17 LAB — I-STAT TROPONIN, ED: Troponin i, poc: 0 ng/mL (ref 0.00–0.08)

## 2017-10-17 MED ORDER — IOPAMIDOL (ISOVUE-370) INJECTION 76%
100.0000 mL | Freq: Once | INTRAVENOUS | Status: AC | PRN
  Administered 2017-10-17: 100 mL via INTRAVENOUS

## 2017-10-17 MED ORDER — ONDANSETRON HCL 4 MG/2ML IJ SOLN
4.0000 mg | Freq: Once | INTRAMUSCULAR | Status: AC
  Administered 2017-10-17: 4 mg via INTRAVENOUS
  Filled 2017-10-17: qty 2

## 2017-10-17 MED ORDER — METHOCARBAMOL 500 MG PO TABS
500.0000 mg | ORAL_TABLET | Freq: Once | ORAL | Status: DC
  Administered 2017-10-17: 500 mg via ORAL
  Filled 2017-10-17: qty 1

## 2017-10-17 MED ORDER — OXYCODONE-ACETAMINOPHEN 5-325 MG PO TABS
2.0000 | ORAL_TABLET | Freq: Once | ORAL | Status: DC
  Administered 2017-10-17: 2 via ORAL
  Filled 2017-10-17: qty 2

## 2017-10-17 MED ORDER — SODIUM CHLORIDE 0.9 % IV BOLUS (SEPSIS)
1000.0000 mL | Freq: Once | INTRAVENOUS | Status: AC
  Administered 2017-10-17: 1000 mL via INTRAVENOUS

## 2017-10-17 MED ORDER — KETOROLAC TROMETHAMINE 15 MG/ML IJ SOLN
15.0000 mg | Freq: Once | INTRAMUSCULAR | Status: AC
  Administered 2017-10-17: 15 mg via INTRAVENOUS
  Filled 2017-10-17: qty 1

## 2017-10-17 MED ORDER — LEVOFLOXACIN 750 MG PO TABS: 750.0000 mg | ORAL_TABLET | Freq: Once | ORAL | Status: DC

## 2017-10-17 MED ORDER — HYDROMORPHONE HCL 1 MG/ML IJ SOLN
1.0000 mg | Freq: Once | INTRAMUSCULAR | Status: AC
  Administered 2017-10-17: 1 mg via INTRAVENOUS
  Filled 2017-10-17: qty 1

## 2017-10-17 MED ORDER — HYDROMORPHONE HCL 1 MG/ML IJ SOLN
1.0000 mg | Freq: Once | INTRAMUSCULAR | Status: DC
  Filled 2017-10-17: qty 1

## 2017-10-17 MED ORDER — IOPAMIDOL (ISOVUE-370) INJECTION 76%
INTRAVENOUS | Status: AC
  Filled 2017-10-17: qty 100

## 2017-10-17 NOTE — ED Notes (Signed)
MD at bedside. 

## 2017-10-17 NOTE — ED Provider Notes (Signed)
[facility] DEPT Provider Note   CSN: [id number] Arrival date & time: 10/17/17  0704     History   Chief Complaint Chief Complaint  Patient presents with  . Chest Injury    HPI [name] [name] is a 57 y.o. male.  HPI 57 year old male with history of hypertension, diabetes, hepatitis C, here with severe left chest pain.  Patient reportedly sustained a fall 3-4 weeks ago.  He had 1/10 rib fracture at that time.  Since then, his pain is improved.  However, earlier today, he awoke with a severe, sharp, stabbing, left and anterior chest pain.  The pain is worse with any kind of movement or palpation.  Pain is worse with deep inspiration.  He said associated cough.  No hemoptysis.  No fevers or chills.  No appetite changes.  He states the current pain feels significantly worse than the pain did when he broke his rib.  Denies any recent falls.  No alleviating factors.  Past Medical History:  Diagnosis Date  . Barrett's esophagus dx 2016  . Bronchitis   . Depression   . Diabetes ([place])   . ED (erectile dysfunction)   . GERD (gastroesophageal reflux disease)   . Hepatitis C   . Neuropathy     Patient Active Problem List   Diagnosis Date Noted  . 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 Essential hypertension 04/02/2017  . Hypogonadism male 08/13/2016  . ETOH abuse 06/22/2016  . Low serum testosterone level 01/08/2016  . Uncontrolled type 2 diabetes mellitus without complication, without long-term current use of insulin ([place]) 01/02/2016  . Erectile dysfunction 09/12/2015  . Hepatic cirrhosis ([place]) 01/10/2015  . Chronic hepatitis C without hepatic coma ([place]) 10/31/2014  . Tobacco use disorder 08/03/2013    Past Surgical History:  Procedure Laterality Date  . APPENDECTOMY    . COLONOSCOPY N/A 02/16/2014   Procedure: COLONOSCOPY;  Surgeon: Gatha Mayer, MD;  Location: [facility]COPY;  Service: Endoscopy;  Laterality: N/A;  .  COLONOSCOPY    . TONSILLECTOMY         Home Medications    Prior to Admission medications   Medication Sig Start Date End Date Taking? Authorizing Provider  albuterol (VENTOLIN HFA) 108 (90 Base) MCG/ACT inhaler Inhale 2 puffs into the lungs every 6 (six) hours as needed for wheezing or shortness of breath. 04/04/17  Yes Funches, Adriana Mccallum, MD  aspirin EC 81 MG tablet Take 1 tablet (81 mg total) by mouth daily. 04/02/17  Yes Funches, Josalyn, MD  atorvastatin (LIPITOR) 40 MG tablet Take 1 tablet (40 mg total) by mouth daily. 04/02/17  Yes Funches, Josalyn, MD  Blood Glucose Monitoring Suppl (TRUE METRIX METER) w/Device KIT 1 each by Does not apply route as needed. 02/18/16  Yes Funches, Adriana Mccallum, MD  canagliflozin (INVOKANA) 100 MG TABS tablet Take 1 tablet (100 mg total) by mouth daily before breakfast. 04/02/17  Yes Funches, Josalyn, MD  citalopram (CELEXA) 20 MG tablet Take 20 mg by mouth daily.   Yes [provider]  gabapentin (NEURONTIN) 300 MG capsule Take 1 capsule (300 mg total) at bedtime by mouth. 07/08/17  Yes Hairston, Mandesia R, FNP  glucose blood (TRUE METRIX BLOOD GLUCOSE TEST) test strip 1 each by Other route 3 (three) times daily. 02/18/16  Yes Funches, Josalyn, MD  ibuprofen (ADVIL,MOTRIN) 600 MG tablet Take 1 tablet by mouth every 8 (eight) hours as needed. 07/08/17  Yes [provider]  losartan (COZAAR) 100 MG  tablet Take 1 tablet (100 mg total) daily by mouth. 07/08/17  Yes Hairston, Maylon Peppers, FNP  Multiple Vitamins-Minerals (MULTIVITAMIN ADULT) TABS Take 1 tablet by mouth daily. 04/02/17  Yes Funches, Josalyn, MD  omeprazole (PRILOSEC) 20 MG capsule TAKE ONE CAPSULE BY MOUTH DAILY 02/22/17  Yes Armbruster, Carlota Raspberry, MD  traMADol (ULTRAM) 50 MG tablet Take 1 tablet (50 mg total) by mouth every 12 (twelve) hours as needed for severe pain. 09/16/17  Yes Yu, Amy V, PA-C  TRUEPLUS LANCETS 28G MISC 1 each by Does not apply route 3 (three) times daily. 02/18/16  Yes Funches,  Josalyn, MD  zolpidem (AMBIEN) 10 MG tablet Take 10 mg by mouth at bedtime as needed for sleep. At 2000   Yes [provider]  diclofenac (VOLTAREN) 50 MG EC tablet Take 1 tablet (50 mg total) by mouth 2 (two) times daily. Take with food. Patient not taking: Reported on 10/17/2017 09/16/17   Ok Edwards, PA-C  testosterone cypionate (DEPOTESTOSTERONE CYPIONATE) 200 MG/ML injection Inject 1 mL (200 mg total) into the muscle every 14 (fourteen) days. For PASS Patient not taking: Reported on 07/09/2017 02/25/17   Boykin Nearing, MD    Family History Family History  Problem Relation Age of Onset  . Cancer Mother        breast  . Diabetes Mother   . Heart disease Maternal Grandfather   . Pancreatic cancer Paternal Grandmother   . Stroke Father   . Colon cancer Neg Hx   . Stomach cancer Neg Hx   . Rectal cancer Neg Hx   . Esophageal cancer Neg Hx     Social History Social History   Tobacco Use  . Smoking status: Former Smoker    Packs/day: 0.50    Years: 35.00    Pack years: 17.50    Types: Cigarettes  . Smokeless tobacco: Never Used  Substance Use Topics  . Alcohol use: Yes    Alcohol/week: 16.2 oz    Types: 6 Cans of beer, 21 Standard drinks or equivalent per week    Comment: 6 beers per day x17 years (2001)  . Drug use: No     Allergies   Patient has no known allergies.   Review of Systems Review of Systems  Constitutional: Negative for chills, fatigue and fever.  HENT: Negative for congestion and rhinorrhea.   Eyes: Negative for visual disturbance.  Respiratory: Positive for cough and shortness of breath. Negative for wheezing.   Cardiovascular: Positive for chest pain. Negative for leg swelling.  Gastrointestinal: Negative for abdominal pain, diarrhea, nausea and vomiting.  Genitourinary: Negative for dysuria and flank pain.  Musculoskeletal: Negative for neck pain and neck stiffness.  Skin: Negative for rash and wound.  Allergic/Immunologic: Negative for  immunocompromised state.  Neurological: Negative for syncope, weakness and headaches.  All other systems reviewed and are negative.    Physical Exam Updated Vital Signs BP (!) 155/88   Pulse 88   Temp 98.7 F (37.1 C) (Oral)   Resp 16   Ht 5' 11" (1.803 m)   Wt 81.6 kg (180 lb)   SpO2 93%   BMI 25.10 kg/m   Physical Exam  Constitutional: He is oriented to person, place, and time. He appears well-developed and well-nourished. No distress.  HENT:  Head: Normocephalic and atraumatic.  Eyes: Conjunctivae are normal.  Neck: Neck supple.  Cardiovascular: Normal rate, regular rhythm and normal heart sounds. Exam reveals no friction rub.  No murmur heard. Pulmonary/Chest: Effort normal.  No respiratory distress. He has no wheezes. He has rales (Left basilar).  Significant pinpoint tenderness over the anterior and left chest wall over third and fourth intercostal spaces.  No bruising or deformity.  Abdominal: He exhibits no distension.  Musculoskeletal: He exhibits no edema.  Neurological: He is alert and oriented to person, place, and time. He exhibits normal muscle tone.  Skin: Skin is warm. Capillary refill takes less than 2 seconds.  Psychiatric: He has a normal mood and affect.  Nursing note and vitals reviewed.    ED Treatments / Results  Labs (all labs ordered are listed, but only abnormal results are displayed) Labs Reviewed  BASIC METABOLIC PANEL - Abnormal; Notable for the following components:      Result Value   Chloride 97 (*)    Glucose, Bld 143 (*)    All other components within normal limits  CBC  I-STAT TROPONIN, ED    EKG  EKG Interpretation  Date/Time:  Sunday October 17 2017 07:11:06 EST Ventricular Rate:  111 PR Interval:    QRS Duration: 86 QT Interval:  337 QTC Calculation: 458 R Axis:   61 Text Interpretation:  Sinus tachycardia Probable left atrial enlargement Slight peaking of TW is more evident than prior Confirmed by Duffy Bruce  [phone]) on 10/17/2017 7:20:33 AM       Radiology Dg Chest 2 View  Result Date: 10/17/2017 CLINICAL DATA:  Left-sided chest pain 2 days with cough 1 week. Fall with rib fractures 1 month ago left side. EXAM: CHEST  2 VIEW COMPARISON:  10/23/2014 FINDINGS: Lungs are adequately inflated with airspace consolidation over the left lower lobe likely pneumonia. Likely small associated left pleural effusion. Right lung is clear. Cardiomediastinal silhouette is within normal. Evidence of patient's known displaced left posterolateral tenth rib fracture. IMPRESSION: Left lower lobe airspace process likely a pneumonia with suggestion of small associated left pleural effusion. Displaced posterolateral left tenth rib fracture. Electronically Signed   By: Marin Olp M.D.   On: 10/17/2017 08:00   Ct Angio Chest Pe W And/or Wo Contrast  Addendum Date: 10/17/2017   ADDENDUM REPORT: 10/17/2017 10:14 ADDENDUM: Dr. Ellender Hose called and indicated that this patient had a rib fracture on the left about 1 month ago and asked whether the left pleural fluid could be related to hemorrhage. Attenuation of this fluid ranges from 35-49 Hounsfield units, consistent with complication by proteinaceous debris or hemorrhage. Imaging features are certainly compatible with hemothorax. Although the previously described left tenth rib fracture is not included on today's study, there is evidence of a left eleventh rib fracture adjacent to the spine with associated bony callus. Electronically Signed   By: Misty Stanley M.D.   On: 10/17/2017 10:14   Result Date: 10/17/2017 CLINICAL DATA:  Left-sided thoracic pain. Clinical concern for pulmonary embolus. EXAM: CT ANGIOGRAPHY CHEST WITH CONTRAST TECHNIQUE: Multidetector CT imaging of the chest was performed using the standard protocol during bolus administration of intravenous contrast. Multiplanar CT image reconstructions and MIPs were obtained to evaluate the vascular anatomy. CONTRAST:  118m  ISOVUE-370 IOPAMIDOL (ISOVUE-370) INJECTION 76% COMPARISON:  None. FINDINGS: Cardiovascular: The heart size is normal. No pericardial effusion. No thoracic aortic aneurysm. No evidence for dissection of the thoracic aorta no filling defect in the opacified pulmonary arteries to suggest the presence of an acute pulmonary embolus. Mediastinum/Nodes: No mediastinal lymphadenopathy. There is no hilar lymphadenopathy. The esophagus has normal imaging features. There is no axillary lymphadenopathy. Lungs/Pleura: Centrilobular emphysema noted. Small to moderate left  pleural effusion is associated dependent basilar atelectasis in both lower lobes. No focal airspace consolidation. No suspicious pulmonary nodule or mass. Upper Abdomen: The liver shows diffusely decreased attenuation suggesting steatosis. Musculoskeletal: Bone windows reveal no worrisome lytic or sclerotic osseous lesions. Review of the MIP images confirms the above findings. IMPRESSION: 1. No CT evidence for acute pulmonary embolus. 2. Small to moderate left pleural effusion of unknown etiology as unilateral effusion can be a sign of malignancy, close follow-up is recommended. 3. Hepatic steatosis. Electronically Signed: By: Misty Stanley M.D. On: 10/17/2017 09:02    Procedures Procedures (including critical care time)  Medications Ordered in ED Medications  iopamidol (ISOVUE-370) 76 % injection (not administered)  HYDROmorphone (DILAUDID) injection 1 mg (1 mg Intravenous Not Given 10/17/17 1141)  HYDROmorphone (DILAUDID) injection 1 mg (1 mg Intravenous Given 10/17/17 0745)  ondansetron (ZOFRAN) injection 4 mg (4 mg Intravenous Given 10/17/17 0745)  ketorolac (TORADOL) 15 MG/ML injection 15 mg (15 mg Intravenous Given 10/17/17 0745)  sodium chloride 0.9 % bolus 1,000 mL (0 mLs Intravenous Stopped 10/17/17 0845)  iopamidol (ISOVUE-370) 76 % injection 100 mL (100 mLs Intravenous Contrast Given 10/17/17 0818)     Initial Impression / Assessment and  Plan / ED Course  I have reviewed the triage vital signs and the nursing notes.  Pertinent labs & imaging results that were available during my care of the patient were reviewed by me and considered in my medical decision making (see chart for details).     57 year old male with history of rib fracture 1 month ago here with severe left chest wall pain, pleurisy, and shortness of breath.  He is tachycardic and borderline hypoxic.  CT scan obtained and is concerning for new moderate hemothorax.  I suspect this may be a delayed bleed in the setting of his healing fractures. Discussed with Dr. Prescott Gum who will see pt.  Dr. Prescott Gum has seen.  He suspects that this is possible nerve related injury to his rib fractures and does not feel that the effusion seen on CT is representative of a hemothorax.  He recommends discharge home with outpatient follow-up.  He has provided return precautions as well as prescriptions to the patient, who is feeling much improved after pain control and is satting well on room air.   Final Clinical Impressions(s) / ED Diagnoses   Final diagnoses:  Chest wall pain    ED Discharge Orders    None       Duffy Bruce, MD 10/17/17 [phone]

## 2017-10-17 NOTE — ED Triage Notes (Signed)
He c/o sudden onset of left-sided lateral thoracic area pain since ~0600 this morning. He states he has difficulty taking a deep breath. EKG performed at triage.

## 2017-10-17 NOTE — Progress Notes (Signed)
  Subjective: Called to see patient in the emergency department with left chest pain after previous fracture to ribs 10 and 11 posterior left side last month. He fell backwards overbath tub  on his left back.  Patient examined, chest x-ray from last month" current chest x-ray and chest CT scan images personally reviewed and discussed with patient  The pain is described as sharp sticking pain which is anterior under his left breast crease. It feels somewhat numb with swollen sensation as well  No cough fever or night sweats or shortness of breath or hemoptysis  EKG is normal, troponin is negative.  Chest x-ray shows blunting of left costophrenic angle since previous image showing the initial fracture. On the studies today the ribs show  healing. CT scan shows a small left pleural effusion. Does not appear to be infected or an empyema. He does not have a significant hemothorax.  The patient states his symptoms have improved after receiving IV Toradol in the ED.  The patient appears to have neuritic pain related to his previous fractures. He has taken Neurontin chronically for his diabetic neuropathy. I recommended he increase his dose of Neurontin 3 times a day to help with his postoperative fracture pain. If that does not improve symptoms and he was given a prescription for Lyrica to take as needed. He was also provided prescription for oral tramadol since the IV Toradol was helpful.  Patient will contact our office with information I provided if his symptoms not improved over  the next 2-3 weeks.  Objective: Vital signs in last 24 hours: Temp:  [98.7 F (37.1 C)] 98.7 F (37.1 C) (02/17 0716) Pulse Rate:  [84-112] 90 (02/17 1300) Resp:  [15-24] 18 (02/17 1300) BP: (142-160)/(70-88) 146/81 (02/17 1300) SpO2:  [93 %-99 %] 94 % (02/17 1300) Weight:  [180 lb (81.6 kg)] 180 lb (81.6 kg) (02/17 0716)  Hemodynamic parameters for last 24 hours:  nsr  Intake/Output from previous day: No  intake/output data recorded. Intake/Output this shift: No intake/output data recorded.       Exam    General- alert and comfortable    Neck- no JVD, no cervical adenopathy palpable, no carotid bruit   Lungs- clear without rales, wheezes. Tender over the left mid thorax anteriorly and posteriorly. No swelling or instability   Cor- regular rate and rhythm, no murmur , gallop   Abdomen- soft, non-tender   Extremities - warm, non-tender, minimal edema   Neuro- oriented, appropriate, no focal weakness   Lab Results: Recent Labs    10/17/17 0718  WBC 7.6  HGB 16.6  HCT 46.4  PLT 242   BMET:  Recent Labs    10/17/17 0718  NA 135  K 4.0  CL 97*  CO2 27  GLUCOSE 143*  BUN 9  CREATININE 0.66  CALCIUM 9.2    PT/INR: No results for input(s): LABPROT, INR in the last 72 hours. ABG No results found for: PHART, HCO3, TCO2, ACIDBASEDEF, O2SAT CBG (last 3)  No results for input(s): GLUCAP in the last 72 hours.  Assessment/Plan: S/P  Neuritic type chest wall pain after rib fractures last month. Recommendations as outlined above.   LOS: 0 days    Tharon Aquas Trigt III 10/17/2017

## 2017-10-20 MED FILL — traMADol HCL 50 MG TABS: 50 | 12 days supply | Qty: 25 | Fill #0

## 2017-11-01 ENCOUNTER — Ambulatory Visit: Payer: Self-pay | Admitting: Family Medicine

## 2017-11-03 ENCOUNTER — Ambulatory Visit: Payer: Self-pay | Admitting: Nurse Practitioner

## 2017-11-05 ENCOUNTER — Other Ambulatory Visit: Payer: Self-pay

## 2017-11-05 MED ORDER — OMEPRAZOLE 20 MG PO CPDR: 20.0000 mg | DELAYED_RELEASE_CAPSULE | Freq: Every day | ORAL | 1 refills | Status: DC

## 2017-11-05 NOTE — Progress Notes (Signed)
Recd call from pharmacy - the pt's omeprazole Rx got messed up in their system and they asked [place] to resend.  I sent for the remainder of the original script.

## 2017-11-06 ENCOUNTER — Encounter: Payer: Self-pay | Admitting: Gastroenterology

## 2017-11-19 ENCOUNTER — Ambulatory Visit: Payer: Self-pay | Admitting: Nurse Practitioner

## 2017-11-23 ENCOUNTER — Encounter: Payer: Self-pay | Admitting: Nurse Practitioner

## 2017-11-23 ENCOUNTER — Ambulatory Visit: Payer: Self-pay | Attending: Nurse Practitioner | Admitting: Nurse Practitioner

## 2017-11-23 VITALS — BP 163/101 | HR 79 | Temp 99.0°F | Ht 71.0 in | Wt 179.6 lb

## 2017-11-23 DIAGNOSIS — Z7982 Long term (current) use of aspirin: Secondary | ICD-10-CM | POA: Insufficient documentation

## 2017-11-23 DIAGNOSIS — Z79899 Other long term (current) drug therapy: Secondary | ICD-10-CM | POA: Insufficient documentation

## 2017-11-23 DIAGNOSIS — F329 Major depressive disorder, single episode, unspecified: Secondary | ICD-10-CM | POA: Insufficient documentation

## 2017-11-23 DIAGNOSIS — E785 Hyperlipidemia, unspecified: Secondary | ICD-10-CM | POA: Insufficient documentation

## 2017-11-23 DIAGNOSIS — E1140 Type 2 diabetes mellitus with diabetic neuropathy, unspecified: Secondary | ICD-10-CM | POA: Insufficient documentation

## 2017-11-23 DIAGNOSIS — I10 Essential (primary) hypertension: Secondary | ICD-10-CM | POA: Insufficient documentation

## 2017-11-23 DIAGNOSIS — IMO0002 Reserved for concepts with insufficient information to code with codable children: Secondary | ICD-10-CM

## 2017-11-23 DIAGNOSIS — Z7984 Long term (current) use of oral hypoglycemic drugs: Secondary | ICD-10-CM | POA: Insufficient documentation

## 2017-11-23 DIAGNOSIS — E782 Mixed hyperlipidemia: Secondary | ICD-10-CM

## 2017-11-23 DIAGNOSIS — K219 Gastro-esophageal reflux disease without esophagitis: Secondary | ICD-10-CM | POA: Insufficient documentation

## 2017-11-23 DIAGNOSIS — K7460 Unspecified cirrhosis of liver: Secondary | ICD-10-CM | POA: Insufficient documentation

## 2017-11-23 DIAGNOSIS — K449 Diaphragmatic hernia without obstruction or gangrene: Secondary | ICD-10-CM | POA: Insufficient documentation

## 2017-11-23 DIAGNOSIS — E1165 Type 2 diabetes mellitus with hyperglycemia: Secondary | ICD-10-CM

## 2017-11-23 LAB — GLUCOSE, POCT (MANUAL RESULT ENTRY): POC Glucose: 175 mg/dl — AB (ref 70–99)

## 2017-11-23 LAB — POCT GLYCOSYLATED HEMOGLOBIN (HGB A1C): HEMOGLOBIN A1C: 7.9

## 2017-11-23 MED ORDER — CANAGLIFLOZIN 100 MG PO TABS: 100.0000 mg | ORAL_TABLET | Freq: Every day | ORAL | 3 refills | Status: DC

## 2017-11-23 MED ORDER — PREGABALIN 100 MG PO CAPS: 100.0000 mg | ORAL_CAPSULE | Freq: Two times a day (BID) | ORAL | 0 refills | Status: DC

## 2017-11-23 MED ORDER — ATORVASTATIN CALCIUM 40 MG PO TABS: 40.0000 mg | ORAL_TABLET | Freq: Every day | ORAL | 1 refills | Status: DC

## 2017-11-23 MED ORDER — LOSARTAN POTASSIUM 100 MG PO TABS: 100.0000 mg | ORAL_TABLET | Freq: Every day | ORAL | 1 refills | Status: DC

## 2017-11-23 MED ORDER — CLONIDINE HCL 0.1 MG PO TABS
0.1000 mg | ORAL_TABLET | Freq: Once | ORAL | Status: AC
  Administered 2017-11-23: 0.1 mg via ORAL

## 2017-11-23 MED ORDER — OMEPRAZOLE 20 MG PO CPDR: 20.0000 mg | DELAYED_RELEASE_CAPSULE | Freq: Every day | ORAL | 1 refills | Status: DC

## 2017-11-23 MED FILL — LOSARTAN POTASSIUM 100 MG T: 100 | 30 days supply | Qty: 30 | Fill #0

## 2017-11-23 MED FILL — ATORVASTATIN 40 MG TABLET: 40 | 30 days supply | Qty: 30 | Fill #0

## 2017-11-23 MED FILL — INVOKANA 100 MG TABLET: 100 | 30 days supply | Qty: 30 | Fill #6

## 2017-11-23 NOTE — Progress Notes (Signed)
Assessment & Plan:  [name] [name] was seen today for establish care.  Diagnoses and all orders for this visit:  Uncontrolled diabetes mellitus with diabetic neuropathy ([place]) -     Glucose (CBG) -     HgB A1c -     pregabalin (LYRICA) 100 MG capsule; Take 1 capsule (100 mg total) by mouth 2 (two) times daily. -     canagliflozin (INVOKANA) 100 MG TABS tablet; Take 1 tablet (100 mg total) by mouth daily before breakfast. Continue blood sugar control as discussed in office today, low carbohydrate diet, and regular physical exercise as tolerated, 150 minutes per week (30 min each day, 5 days per week, or 50 min 3 days per week). Keep blood sugar logs with fasting goal of 80-130 mg/dl, post prandial less than 180.  For Hypoglycemia: BS <60 and Hyperglycemia BS >400; contact the clinic ASAP. Annual eye exams and foot exams are recommended.   Essential hypertension -     cloNIDine (CATAPRES) tablet 0.1 mg -     losartan (COZAAR) 100 MG tablet; Take 1 tablet (100 mg total) by mouth daily. Continue all antihypertensives as prescribed.  Remember to bring in your blood pressure log with you for your follow up appointment.  DASH/Mediterranean Diets are healthier choices for HTN.   Cirrhosis of liver without ascites, unspecified hepatic cirrhosis type (HCC) Resolved  Gastroesophageal reflux disease, esophagitis presence not specified -     omeprazole (PRILOSEC) 20 MG capsule; Take 1 capsule (20 mg total) by mouth daily. INSTRUCTIONS: Avoid GERD Triggers: acidic, spicy or fried foods, caffeine, coffee, sodas,  alcohol and chocolate.   Mixed hyperlipidemia -     atorvastatin (LIPITOR) 40 MG tablet; Take 1 tablet (40 mg total) by mouth daily.  Work on a low fat, heart healthy diet and participate in regular aerobic exercise program to control as well by working out at least 150 minutes per week. No fried foods. No junk foods, sodas, sugary drinks, unhealthy snacking, or smoking.   Patient has been  counseled on age-appropriate routine health concerns for screening and prevention. These are reviewed and up-to-date. Referrals have been placed accordingly. Immunizations are up-to-date or declined.    Subjective:   Chief Complaint  Patient presents with  . Establish Care    Patient is her to establish care for diabetes.    HPI [name] [name] 57 y.o. male presents to office today to establish care. He has a history of DM, HTN, Hep C (completed treatment), Barrett's esophagus, GERD, ETOH abuse and Depression.   Diabetes Mellitus Type 2 Chronic. A1c is increasing. He has not been diet or exercise compliant. He has recently run out of invokana (1 month).  Has tried metforrmin in the past but but reports it caused severe lower abdominal pain. when taking metformin. Has run out of invokana (over 1 month ago). He does not check his blood sugars at home although he has a glucometer and supplies. He endorses diabetic neuropathy. He took gabapentin in the past with no relief of symptoms. He was switched to Lyrica. He is due for an eye exam. He needs to speak with the financial counselor.  Lab Results  Component Value Date   HGBA1C 7.9 11/23/2017    Essential Hypertension His blood pressure is elevated today. Not well controlled. He required clonidine in the office today. He endorses taking losartan 112m daily as prescribed. Will f/u in 2 weeks. If remains elevated will adjust antihypertensive. Denies chest pain, shortness of breath, palpitations,  lightheadedness, dizziness, headaches or BLE edema.  BP Readings from Last 3 Encounters:  11/23/17 (!) 163/101  10/17/17 (!) 155/88  09/16/17 (!) 167/84   Hyperlipidemia Taking atorvastatin 18m daily. He denies statin intolerance or myalgias. His LDL is not at goal. Awaiting fasting labs.  Lab Results  Component Value Date   LDLCALC 157 (H) 04/02/2017    Depression Taking Celexa and Ambien as prescribed. He receives behavioral health services  through [facility] Denies any suicidal ideation.    Review of Systems  Constitutional: Negative for fever, malaise/fatigue and weight loss.  HENT: Negative.  Negative for nosebleeds.   Eyes: Negative.  Negative for blurred vision, double vision and photophobia.  Respiratory: Negative.  Negative for cough and shortness of breath.   Cardiovascular: Negative.  Negative for chest pain, palpitations and leg swelling.  Gastrointestinal: Positive for heartburn. Negative for nausea and vomiting.  Musculoskeletal: Negative.  Negative for myalgias.  Neurological: Positive for tingling and sensory change. Negative for dizziness, focal weakness, seizures and headaches.  Psychiatric/Behavioral: Positive for depression. Negative for suicidal ideas. The patient is nervous/anxious and has insomnia.     Past Medical History:  Diagnosis Date  . Barrett's esophagus dx 2016  . Bronchitis   . Depression   . Diabetes ([place]   . ED (erectile dysfunction)   . GERD (gastroesophageal reflux disease)   . Hepatitis C   . Hiatal hernia 10/2014   3cm  . Neuropathy     Past Surgical History:  Procedure Laterality Date  . APPENDECTOMY    . COLONOSCOPY N/A 02/16/2014   Procedure: COLONOSCOPY;  Surgeon: CGatha Mayer MD;  Location: [facility];  Service: Endoscopy;  Laterality: N/A;  . COLONOSCOPY    . TONSILLECTOMY      Family History  Problem Relation Age of Onset  . Cancer Mother        breast  . Diabetes Mother   . Heart disease Maternal Grandfather   . Pancreatic cancer Paternal Grandmother   . Stroke Father   . Alcohol abuse Father   . Pancreatic cancer Father   . Colon cancer Neg Hx   . Stomach cancer Neg Hx   . Rectal cancer Neg Hx   . Esophageal cancer Neg Hx     Social History Reviewed with no changes to be made today.   Outpatient Medications Prior to Visit  Medication Sig Dispense Refill  . albuterol (VENTOLIN HFA) 108 (90 Base) MCG/ACT inhaler Inhale 2 puffs into the lungs  every 6 (six) hours as needed for wheezing or shortness of breath. 18 each 0  . aspirin EC 81 MG tablet Take 1 tablet (81 mg total) by mouth daily. 30 tablet 11  . Blood Glucose Monitoring Suppl (TRUE METRIX METER) w/Device KIT 1 each by Does not apply route as needed. 1 kit 0  . citalopram (CELEXA) 40 MG tablet Take 40 mg by mouth daily.    .Marland Kitchenglucose blood (TRUE METRIX BLOOD GLUCOSE TEST) test strip 1 each by Other route 3 (three) times daily. 100 each 11  . ibuprofen (ADVIL,MOTRIN) 600 MG tablet Take 1 tablet by mouth every 8 (eight) hours as needed.  0  . Multiple Vitamins-Minerals (MULTIVITAMIN ADULT) TABS Take 1 tablet by mouth daily. 30 tablet 11  . TRUEPLUS LANCETS 28G MISC 1 each by Does not apply route 3 (three) times daily. 100 each 11  . zolpidem (AMBIEN) 10 MG tablet Take 10 mg by mouth at bedtime as needed for sleep. At 2000    .  atorvastatin (LIPITOR) 40 MG tablet Take 1 tablet (40 mg total) by mouth daily. 30 tablet 11  . citalopram (CELEXA) 20 MG tablet Take 40 mg by mouth daily.     Marland Kitchen losartan (COZAAR) 100 MG tablet Take 1 tablet (100 mg total) daily by mouth. 90 tablet 3  . omeprazole (PRILOSEC) 20 MG capsule Take 1 capsule (20 mg total) by mouth daily. 90 capsule 1  . canagliflozin (INVOKANA) 100 MG TABS tablet Take 1 tablet (100 mg total) by mouth daily before breakfast. (Patient not taking: Reported on 11/23/2017) 30 tablet 11  . diclofenac (VOLTAREN) 50 MG EC tablet Take 1 tablet (50 mg total) by mouth 2 (two) times daily. Take with food. (Patient not taking: Reported on 10/17/2017) 40 tablet 0  . gabapentin (NEURONTIN) 300 MG capsule Take 1 capsule (300 mg total) at bedtime by mouth. (Patient not taking: Reported on 11/23/2017) 30 capsule 2  . testosterone cypionate (DEPOTESTOSTERONE CYPIONATE) 200 MG/ML injection Inject 1 mL (200 mg total) into the muscle every 14 (fourteen) days. For PASS (Patient not taking: Reported on 07/09/2017) 1 mL 5  . traMADol (ULTRAM) 50 MG tablet Take  1 tablet (50 mg total) by mouth every 12 (twelve) hours as needed for severe pain. (Patient not taking: Reported on 11/23/2017) 10 tablet 0   No facility-administered medications prior to visit.     No Known Allergies     Objective:    BP (!) 163/101 (BP Location: Right Arm, Cuff Size: Normal)   Pulse 79   Temp 99 F (37.2 C) (Oral)   Ht '5\' 11"'  (1.803 m)   Wt 179 lb 9.6 oz (81.5 kg)   SpO2 94%   BMI 25.05 kg/m  Wt Readings from Last 3 Encounters:  11/23/17 179 lb 9.6 oz (81.5 kg)  10/17/17 180 lb (81.6 kg)  09/16/17 175 lb (79.4 kg)    Physical Exam  Constitutional: He is oriented to person, place, and time. He appears well-developed and well-nourished. He is cooperative.  HENT:  Head: Normocephalic and atraumatic.  Eyes: EOM are normal.  Neck: Normal range of motion.  Cardiovascular: Normal rate, regular rhythm and normal heart sounds. Exam reveals no gallop and no friction rub.  No murmur heard. Pulmonary/Chest: Effort normal and breath sounds normal. No tachypnea. No respiratory distress. He has no decreased breath sounds. He has no wheezes. He has no rhonchi. He has no rales. He exhibits no tenderness.  Abdominal: Soft. Bowel sounds are normal.  Musculoskeletal: Normal range of motion. He exhibits no edema.  Neurological: He is alert and oriented to person, place, and time. Coordination normal.  Skin: Skin is warm and dry.  Sensory exam of bilateral feet is abnormal, tested with the monofilament. Good pulses, no lesions or ulcers, good peripheral pulses.  Psychiatric: He has a normal mood and affect. His behavior is normal. Judgment and thought content normal.  Nursing note and vitals reviewed.      Patient has been counseled extensively about nutrition and exercise as well as the importance of adherence with medications and regular follow-up. The patient was given clear instructions to go to ER or return to medical center if symptoms don't improve, worsen or new  problems develop. The patient verbalized understanding.   Follow-up: Return for see check out note.   [name], FNP-BC [facility] and [facility], [place]   11/23/2017, 9:59 PM

## 2017-11-23 NOTE — Progress Notes (Signed)
c 

## 2017-11-23 NOTE — Patient Instructions (Signed)
Neuropathic Pain Neuropathic pain is pain caused by damage to the nerves that are responsible for certain sensations in your body (sensory nerves). The pain can be caused by damage to:  The sensory nerves that send signals to your spinal cord and brain (peripheral nervous system).  The sensory nerves in your brain or spinal cord (central nervous system).  Neuropathic pain can make you more sensitive to pain. What would be a minor sensation for most people may feel very painful if you have neuropathic pain. This is usually a long-term condition that can be difficult to treat. The type of pain can differ from person to person. It may start suddenly (acute), or it may develop slowly and last for a long time (chronic). Neuropathic pain may come and go as damaged nerves heal or may stay at the same level for years. It often causes emotional distress, loss of sleep, and a lower quality of life. What are the causes? The most common cause of damage to a sensory nerve is diabetes. Many other diseases and conditions can also cause neuropathic pain. Causes of neuropathic pain can be classified as:  Toxic. Many drugs and chemicals can cause toxic damage. The most common cause of toxic neuropathic pain is damage from drug treatment for cancer (chemotherapy).  Metabolic. This type of pain can happen when a disease causes imbalances that damage nerves. Diabetes is the most common of these diseases. Vitamin B deficiency caused by long-term alcohol abuse is another common cause.  Traumatic. Any injury that cuts, crushes, or stretches a nerve can cause damage and pain. A common example is feeling pain after losing an arm or leg (phantom limb pain).  Compression-related. If a sensory nerve gets trapped or compressed for a long period of time, the blood supply to the nerve can be cut off.  Vascular. Many blood vessel diseases can cause neuropathic pain by decreasing blood supply and oxygen to nerves.  Autoimmune.  This type of pain results from diseases in which the body's defense system mistakenly attacks sensory nerves. Examples of autoimmune diseases that can cause neuropathic pain include lupus and multiple sclerosis.  Infectious. Many types of viral infections can damage sensory nerves and cause pain. Shingles infection is a common cause of this type of pain.  Inherited. Neuropathic pain can be a symptom of many diseases that are passed down through families (genetic).  What are the signs or symptoms? The main symptom is pain. Neuropathic pain is often described as:  Burning.  Shock-like.  Stinging.  Hot or cold.  Itching.  How is this diagnosed? No single test can diagnose neuropathic pain. Your health care provider will do a physical exam and ask you about your pain. You may use a pain scale to describe how bad your pain is. You may also have tests to see if you have a high sensitivity to pain and to help find the cause and location of any sensory nerve damage. These tests may include:  Imaging studies, such as: ? X-rays. ? CT scan. ? MRI.  Nerve conduction studies to test how well nerve signals travel through your sensory nerves (electrodiagnostic testing).  Stimulating your sensory nerves through electrodes on your skin and measuring the response in your spinal cord and brain (somatosensory evoked potentials).  How is this treated? Treatment for neuropathic pain may change over time. You may need to try different treatment options or a combination of treatments. Some options include:  Over-the-counter pain relievers.  Prescription medicines. Some medicines   used to treat other conditions may also help neuropathic pain. These include medicines to: ? Control seizures (anticonvulsants). ? Relieve depression (antidepressants).  Prescription-strength pain relievers (narcotics). These are usually used when other pain relievers do not help.  Transcutaneous nerve stimulation (TENS).  This uses electrical currents to block painful nerve signals. The treatment is painless.  Topical and local anesthetics. These are medicines that numb the nerves. They can be injected as a nerve block or applied to the skin.  Alternative treatments, such as: ? Acupuncture. ? Meditation. ? Massage. ? Physical therapy. ? Pain management programs. ? Counseling.  Follow these instructions at home:  Learn as much as you can about your condition.  Take medicines only as directed by your health care provider.  Work closely with all your health care providers to find what works best for you.  Have a good support system at home.  Consider joining a chronic pain support group. Contact a health care provider if:  Your pain treatments are not helping.  You are having side effects from your medicines.  You are struggling with fatigue, mood changes, depression, or anxiety. This information is not intended to replace advice given to you by your health care provider. Make sure you discuss any questions you have with your health care provider. Document Released: 05/14/2004 Document Revised: 03/06/2016 Document Reviewed: 01/25/2014 Elsevier Interactive Patient Education  2018 [employer].  Peripheral Neuropathy Peripheral neuropathy is a type of nerve damage. It affects nerves that carry signals between the spinal cord and other parts of the body. These are called peripheral nerves. With peripheral neuropathy, one nerve or a group of nerves may be damaged. What are the causes? Many things can damage peripheral nerves. For some people with peripheral neuropathy, the cause is unknown. Some causes include:  Diabetes. This is the most common cause of peripheral neuropathy.  Injury to a nerve.  Pressure or stress on a nerve that lasts a long time.  Too little vitamin B. Alcoholism can lead to this.  Infections.  Autoimmune diseases, such as multiple sclerosis and systemic lupus  erythematosus.  Inherited nerve diseases.  Some medicines, such as cancer drugs.  Toxic substances, such as lead and mercury.  Too little blood flowing to the legs.  Kidney disease.  Thyroid disease.  What are the signs or symptoms? Different people have different symptoms. The symptoms you have will depend on which of your nerves is damaged. Common symptoms include:  Loss of feeling (numbness) in the feet and hands.  Tingling in the feet and hands.  Pain that burns.  Very sensitive skin.  Weakness.  Not being able to move a part of the body (paralysis).  Muscle twitching.  Clumsiness or poor coordination.  Loss of balance.  Not being able to control your bladder.  Feeling dizzy.  Sexual problems.  How is this diagnosed? Peripheral neuropathy is a symptom, not a disease. Finding the cause of peripheral neuropathy can be hard. To figure that out, your health care provider will take a medical history and do a physical exam. A neurological exam will also be done. This involves checking things affected by your brain, spinal cord, and nerves (nervous system). For example, your health care provider will check your reflexes, how you move, and what you can feel. Other types of tests may also be ordered, such as:  Blood tests.  A test of the fluid in your spinal cord.  Imaging tests, such as CT scans or an MRI.  Electromyography (  EMG). This test checks the nerves that control muscles.  Nerve conduction velocity tests. These tests check how fast messages pass through your nerves.  Nerve biopsy. A small piece of nerve is removed. It is then checked under a microscope.  How is this treated?  Medicine is often used to treat peripheral neuropathy. Medicines may include: ? Pain-relieving medicines. Prescription or over-the-counter medicine may be suggested. ? Antiseizure medicine. This may be used for pain. ? Antidepressants. These also may help ease pain from  neuropathy. ? Lidocaine. This is a numbing medicine. You might wear a patch or be given a shot. ? Mexiletine. This medicine is typically used to help control irregular heart rhythms.  Surgery. Surgery may be needed to relieve pressure on a nerve or to destroy a nerve that is causing pain.  Physical therapy to help movement.  Assistive devices to help movement. Follow these instructions at home:  Only take over-the-counter or prescription medicines as directed by your health care provider. Follow the instructions carefully for any given medicines. Do not take any other medicines without first getting approval from your health care provider.  If you have diabetes, work closely with your health care provider to keep your blood sugar under control.  If you have numbness in your feet: ? Check every day for signs of injury or infection. Watch for redness, warmth, and swelling. ? Wear padded socks and comfortable shoes. These help protect your feet.  Do not do things that put pressure on your damaged nerve.  Do not smoke. Smoking keeps blood from getting to damaged nerves.  Avoid or limit alcohol. Too much alcohol can cause a lack of B vitamins. These vitamins are needed for healthy nerves.  Develop a good support system. Coping with peripheral neuropathy can be stressful. Talk to a mental health specialist or join a support group if you are struggling.  Follow up with your health care provider as directed. Contact a health care provider if:  You have new signs or symptoms of peripheral neuropathy.  You are struggling emotionally from dealing with peripheral neuropathy.  You have a fever. Get help right away if:  You have an injury or infection that is not healing.  You feel very dizzy or begin vomiting.  You have chest pain.  You have trouble breathing. This information is not intended to replace advice given to you by your health care provider. Make sure you discuss any  questions you have with your health care provider. Document Released: 08/07/2002 Document Revised: 01/23/2016 Document Reviewed: 04/24/2013 Elsevier Interactive Patient Education  2017 [employer].

## 2017-12-07 ENCOUNTER — Ambulatory Visit: Payer: Self-pay | Attending: Internal Medicine | Admitting: *Deleted

## 2017-12-07 VITALS — BP 129/79 | HR 88 | Resp 16

## 2017-12-07 DIAGNOSIS — I10 Essential (primary) hypertension: Secondary | ICD-10-CM

## 2017-12-07 DIAGNOSIS — Z0130 Encounter for examination of blood pressure without abnormal findings: Secondary | ICD-10-CM | POA: Insufficient documentation

## 2017-12-07 NOTE — Progress Notes (Signed)
[name] arrived to [facility] alert and oriented in good spirits. He is here for nurse visit to check his blood pressure. At last OV on 11/23/17 with PCP, his blood pressure reading was 163/101.  Pt denies chest pain, SOB, HA, dizziness, or blurred vision. Verified medication with patient. Pt states medication was taken this morning.  Blood pressure reading: 129/79

## 2017-12-16 ENCOUNTER — Encounter (HOSPITAL_COMMUNITY): Payer: Self-pay | Admitting: Emergency Medicine

## 2017-12-16 ENCOUNTER — Emergency Department (HOSPITAL_COMMUNITY)
Admission: EM | Admit: 2017-12-16 | Discharge: 2017-12-17 | Disposition: A | Discharge status: Home / Self Care | Payer: Self-pay | Attending: Emergency Medicine | Admitting: Emergency Medicine

## 2017-12-16 DIAGNOSIS — F10920 Alcohol use, unspecified with intoxication, uncomplicated: Secondary | ICD-10-CM

## 2017-12-16 DIAGNOSIS — E119 Type 2 diabetes mellitus without complications: Secondary | ICD-10-CM | POA: Insufficient documentation

## 2017-12-16 DIAGNOSIS — F322 Major depressive disorder, single episode, severe without psychotic features: Secondary | ICD-10-CM | POA: Insufficient documentation

## 2017-12-16 DIAGNOSIS — F10929 Alcohol use, unspecified with intoxication, unspecified: Secondary | ICD-10-CM | POA: Insufficient documentation

## 2017-12-16 DIAGNOSIS — F1014 Alcohol abuse with alcohol-induced mood disorder: Secondary | ICD-10-CM | POA: Diagnosis present

## 2017-12-16 DIAGNOSIS — R45851 Suicidal ideations: Secondary | ICD-10-CM | POA: Insufficient documentation

## 2017-12-16 DIAGNOSIS — F17210 Nicotine dependence, cigarettes, uncomplicated: Secondary | ICD-10-CM | POA: Insufficient documentation

## 2017-12-16 DIAGNOSIS — Z7984 Long term (current) use of oral hypoglycemic drugs: Secondary | ICD-10-CM | POA: Insufficient documentation

## 2017-12-16 DIAGNOSIS — Z79899 Other long term (current) drug therapy: Secondary | ICD-10-CM | POA: Insufficient documentation

## 2017-12-16 DIAGNOSIS — Z7982 Long term (current) use of aspirin: Secondary | ICD-10-CM | POA: Insufficient documentation

## 2017-12-16 DIAGNOSIS — I10 Essential (primary) hypertension: Secondary | ICD-10-CM | POA: Insufficient documentation

## 2017-12-16 LAB — CBC WITH DIFFERENTIAL/PLATELET
BASOS ABS: 0 10*3/uL (ref 0.0–0.1)
BASOS PCT: 1 %
EOS ABS: 0.2 10*3/uL (ref 0.0–0.7)
EOS PCT: 2 %
HCT: 46.2 % (ref 39.0–52.0)
Hemoglobin: 16.3 g/dL (ref 13.0–17.0)
Lymphocytes Relative: 41 %
Lymphs Abs: 3.5 10*3/uL (ref 0.7–4.0)
MCH: 32.9 pg (ref 26.0–34.0)
MCHC: 35.3 g/dL (ref 30.0–36.0)
MCV: 93.3 fL (ref 78.0–100.0)
Monocytes Absolute: 1.1 10*3/uL — ABNORMAL HIGH (ref 0.1–1.0)
Monocytes Relative: 13 %
Neutro Abs: 3.7 10*3/uL (ref 1.7–7.7)
Neutrophils Relative %: 43 %
PLATELETS: 230 10*3/uL (ref 150–400)
RBC: 4.95 MIL/uL (ref 4.22–5.81)
RDW: 12.3 % (ref 11.5–15.5)
WBC: 8.4 10*3/uL (ref 4.0–10.5)

## 2017-12-16 LAB — RAPID URINE DRUG SCREEN, HOSP PERFORMED
AMPHETAMINES: NOT DETECTED
BENZODIAZEPINES: NOT DETECTED
Barbiturates: NOT DETECTED
COCAINE: NOT DETECTED
Opiates: NOT DETECTED
TETRAHYDROCANNABINOL: NOT DETECTED

## 2017-12-16 LAB — COMPREHENSIVE METABOLIC PANEL
ALBUMIN: 4.1 g/dL (ref 3.5–5.0)
ALT: 73 U/L — ABNORMAL HIGH (ref 17–63)
ANION GAP: 12 (ref 5–15)
AST: 83 U/L — ABNORMAL HIGH (ref 15–41)
Alkaline Phosphatase: 77 U/L (ref 38–126)
BUN: <5 mg/dL — ABNORMAL LOW (ref 6–20)
CO2: 26 mmol/L (ref 22–32)
Calcium: 9 mg/dL (ref 8.9–10.3)
Chloride: 101 mmol/L (ref 101–111)
Creatinine, Ser: 0.53 mg/dL — ABNORMAL LOW (ref 0.61–1.24)
GFR calc Af Amer: >60 mL/min (ref >60)
GFR calc non Af Amer: >60 mL/min (ref >60)
GLUCOSE: 160 mg/dL — AB (ref 65–99)
POTASSIUM: 3.8 mmol/L (ref 3.5–5.1)
SODIUM: 139 mmol/L (ref 135–145)
Total Bilirubin: 0.4 mg/dL (ref 0.3–1.2)
Total Protein: 8.1 g/dL (ref 6.5–8.1)

## 2017-12-16 LAB — I-STAT CHEM 8, ED
BUN: <3 mg/dL — ABNORMAL LOW (ref 6–20)
Calcium, Ion: 0.98 mmol/L — ABNORMAL LOW (ref 1.15–1.40)
Chloride: 100 mmol/L — ABNORMAL LOW (ref 101–111)
Creatinine, Ser: 0.8 mg/dL (ref 0.61–1.24)
Glucose, Bld: 157 mg/dL — ABNORMAL HIGH (ref 65–99)
HEMATOCRIT: 49 % (ref 39.0–52.0)
HEMOGLOBIN: 16.7 g/dL (ref 13.0–17.0)
Potassium: 3.9 mmol/L (ref 3.5–5.1)
SODIUM: 139 mmol/L (ref 135–145)
TCO2: 27 mmol/L (ref 22–32)

## 2017-12-16 LAB — ACETAMINOPHEN LEVEL: Acetaminophen (Tylenol), Serum: <10 ug/mL — ABNORMAL LOW (ref 10–30)

## 2017-12-16 LAB — CBG MONITORING, ED
Glucose-Capillary: 135 mg/dL — ABNORMAL HIGH (ref 65–99)
Glucose-Capillary: 242 mg/dL — ABNORMAL HIGH (ref 65–99)

## 2017-12-16 LAB — SALICYLATE LEVEL: Salicylate Lvl: <7 mg/dL (ref 2.8–30.0)

## 2017-12-16 LAB — ETHANOL: Alcohol, Ethyl (B): 278 mg/dL — ABNORMAL HIGH (ref <10)

## 2017-12-16 MED ORDER — LORAZEPAM 1 MG PO TABS
0.0000 mg | ORAL_TABLET | Freq: Four times a day (QID) | ORAL | Status: DC
  Administered 2017-12-16 (×2): 1 mg via ORAL
  Filled 2017-12-16 (×2): qty 1

## 2017-12-16 MED ORDER — PREGABALIN 50 MG PO CAPS
100.0000 mg | ORAL_CAPSULE | Freq: Two times a day (BID) | ORAL | Status: DC
Start: 2017-12-16 — End: 2017-12-17
  Administered 2017-12-16: 100 mg via ORAL
  Filled 2017-12-16: qty 2

## 2017-12-16 MED ORDER — LORAZEPAM 2 MG/ML IJ SOLN: 0.0000 mg | Freq: Four times a day (QID) | INTRAMUSCULAR | Status: DC

## 2017-12-16 MED ORDER — ACETAMINOPHEN 325 MG PO TABS
650.0000 mg | ORAL_TABLET | ORAL | Status: DC | PRN
  Administered 2017-12-16 – 2017-12-17 (×2): 650 mg via ORAL
  Filled 2017-12-16 (×2): qty 2

## 2017-12-16 MED ORDER — ALUM & MAG HYDROXIDE-SIMETH 200-200-20 MG/5ML PO SUSP: 30.0000 mL | Freq: Four times a day (QID) | ORAL | Status: DC | PRN

## 2017-12-16 MED ORDER — LORAZEPAM 2 MG/ML IJ SOLN: 0.0000 mg | Freq: Two times a day (BID) | INTRAMUSCULAR | Status: DC

## 2017-12-16 MED ORDER — ZOLPIDEM TARTRATE 5 MG PO TABS
5.0000 mg | ORAL_TABLET | Freq: Every evening | ORAL | Status: DC | PRN
  Administered 2017-12-16: 5 mg via ORAL
  Filled 2017-12-16: qty 1

## 2017-12-16 MED ORDER — ASPIRIN EC 81 MG PO TBEC
81.0000 mg | DELAYED_RELEASE_TABLET | Freq: Every day | ORAL | Status: DC
  Administered 2017-12-16 – 2017-12-17 (×2): 81 mg via ORAL
  Filled 2017-12-16 (×2): qty 1

## 2017-12-16 MED ORDER — ONDANSETRON HCL 4 MG PO TABS: 4.0000 mg | ORAL_TABLET | Freq: Three times a day (TID) | ORAL | Status: DC | PRN

## 2017-12-16 MED ORDER — NICOTINE 21 MG/24HR TD PT24
21.0000 mg | MEDICATED_PATCH | Freq: Every day | TRANSDERMAL | Status: DC
  Administered 2017-12-16 – 2017-12-17 (×2): 21 mg via TRANSDERMAL
  Filled 2017-12-16 (×2): qty 1

## 2017-12-16 MED ORDER — LOSARTAN POTASSIUM 50 MG PO TABS
100.0000 mg | ORAL_TABLET | Freq: Every day | ORAL | Status: DC
  Administered 2017-12-17: 100 mg via ORAL
  Filled 2017-12-16: qty 2

## 2017-12-16 MED ORDER — INSULIN ASPART 100 UNIT/ML ~~LOC~~ SOLN
0.0000 [IU] | Freq: Every day | SUBCUTANEOUS | Status: DC
  Administered 2017-12-16: 2 [IU] via SUBCUTANEOUS
  Filled 2017-12-16: qty 1

## 2017-12-16 MED ORDER — ATORVASTATIN CALCIUM 40 MG PO TABS
40.0000 mg | ORAL_TABLET | Freq: Every day | ORAL | Status: DC
  Administered 2017-12-17: 40 mg via ORAL
  Filled 2017-12-16: qty 1

## 2017-12-16 MED ORDER — THIAMINE HCL 100 MG/ML IJ SOLN
100.0000 mg | Freq: Every day | INTRAMUSCULAR | Status: DC
  Filled 2017-12-16: qty 2

## 2017-12-16 MED ORDER — LORAZEPAM 1 MG PO TABS: 0.0000 mg | ORAL_TABLET | Freq: Two times a day (BID) | ORAL | Status: DC

## 2017-12-16 MED ORDER — VITAMIN B-1 100 MG PO TABS
100.0000 mg | ORAL_TABLET | Freq: Every day | ORAL | Status: DC
  Administered 2017-12-16 – 2017-12-17 (×2): 100 mg via ORAL
  Filled 2017-12-16 (×2): qty 1

## 2017-12-16 MED ORDER — CITALOPRAM HYDROBROMIDE 10 MG PO TABS
40.0000 mg | ORAL_TABLET | Freq: Every day | ORAL | Status: DC
  Administered 2017-12-16 – 2017-12-17 (×2): 40 mg via ORAL
  Filled 2017-12-16 (×2): qty 4

## 2017-12-16 MED ORDER — INSULIN ASPART 100 UNIT/ML ~~LOC~~ SOLN
0.0000 [IU] | Freq: Three times a day (TID) | SUBCUTANEOUS | Status: DC
  Administered 2017-12-17: 3 [IU] via SUBCUTANEOUS
  Filled 2017-12-16: qty 1

## 2017-12-16 NOTE — ED Notes (Signed)
Pt and mother ( at bedside) made aware of pt's transfer to [facility]. Both voiced understanding.

## 2017-12-16 NOTE — BH Assessment (Addendum)
Assessment Note  [name] [name] is an 58 y.o. male who presents to the ED voluntarily, accompanied by his mother. Pt provided verbal consent for his mother to be present during the assessment. Pt states he called 911 today because he was feeling suicidal. Pt states he did not have a plan but states he is "just tired of living". Pt identifies his stressors as not being satisfied with his job, being divorced, losing his license, and feeling lost and hopeless. Pt states he drinks up to a 12 pack of beer daily. Pt's BAL is 278 on arrival to the ED.   Pt lives with his mother and she states she has also noticed that he has been more depressed for the past several months. Pt states he has been staying isolated in his room, laying in bed, and abusing his Ambien. Pt states he got the prescription refilled on 12/10/17 and the entire bottle of 30 pills is now empty. Pt shows this Probation officer several bruises on his body and he states he does not know how they got there. Pt admits that he becomes intoxicated and "blacks out."  Pt denies HI and denies AVH. Pt is unable to contract for safety at this time and states that he is willing to sign VOL consent for treatment.  Per Lindon Romp, NP pt is recommended for inpt treatment. [facility] is currently reviewing the pt for possible admission. EDP Domenic Moras, PA-C and pt's nurse Lonny Prude, RN have been advised of the disposition.   Diagnosis: MDD, single episode, severe w/o psychosis; Alcohol use disorder, severe  Past Medical History:  Past Medical History:  Diagnosis Date  . Barrett's esophagus dx 2016  . Bronchitis   . Depression   . Diabetes ([place])   . ED (erectile dysfunction)   . GERD (gastroesophageal reflux disease)   . Hepatitis C   . Hiatal hernia 10/2014   3cm  . Neuropathy     Past Surgical History:  Procedure Laterality Date  . APPENDECTOMY    . COLONOSCOPY N/A 02/16/2014   Procedure: COLONOSCOPY;  Surgeon: Gatha Mayer, MD;  Location: [facility]  ENDOSCOPY;  Service: Endoscopy;  Laterality: N/A;  . COLONOSCOPY    . TONSILLECTOMY      Family History:  Family History  Problem Relation Age of Onset  . Cancer Mother        breast  . Diabetes Mother   . Heart disease Maternal Grandfather   . Pancreatic cancer Paternal Grandmother   . Stroke Father   . Alcohol abuse Father   . Pancreatic cancer Father   . Colon cancer Neg Hx   . Stomach cancer Neg Hx   . Rectal cancer Neg Hx   . Esophageal cancer Neg Hx     Social History:  reports that he has been smoking cigarettes.  He has a 17.50 pack-year smoking history. He has never used smokeless tobacco. He reports that he drinks about 16.2 oz of alcohol per week. He reports that he does not use drugs.  Additional Social History:  Alcohol / Drug Use Pain Medications: See MAR Prescriptions: See MAR Over the Counter: See MAR History of alcohol / drug use?: Yes Longest period of sobriety (when/how long): 1 year Substance #1 Name of Substance 1: Alcohol 1 - Age of First Use: 17 1 - Amount (size/oz): 12 pack of beer daily 1 - Frequency: daily 1 - Duration: ongoing 1 - Last Use / Amount: 12/16/17  CIWA: CIWA-Ar BP: (!) 150/79  Pulse Rate: 99 Nausea and Vomiting: no nausea and no vomiting Tactile Disturbances: none Tremor: no tremor Auditory Disturbances: not present Paroxysmal Sweats: barely perceptible sweating, palms moist Visual Disturbances: not present Anxiety: two Headache, Fullness in Head: none present Agitation: three Orientation and Clouding of Sensorium: oriented and can do serial additions CIWA-Ar Total: 6 COWS:    Allergies:  Allergies  Allergen Reactions  . Bee Venom Anaphylaxis    Home Medications:  (Not in a hospital admission)  OB/GYN Status:  No LMP for male patient.  General Assessment Data Location of Assessment: [facility] ED TTS Assessment: In system Is this a Tele or Face-to-Face Assessment?: Face-to-Face Is this an Initial Assessment or a  Re-assessment for this encounter?: Initial Assessment Marital status: Divorced Is patient pregnant?: No Pregnancy Status: No Living Arrangements: Parent Can pt return to current living arrangement?: Yes Admission Status: Voluntary Is patient capable of signing voluntary admission?: Yes Referral Source: Self/Family/Friend Insurance type: none on file     Crisis Care Plan Living Arrangements: Parent Name of Psychiatrist: Warden/ranger Name of Therapist: Warden/ranger  Education Status Is patient currently in school?: No Is the patient employed, unemployed or receiving disability?: Employed  Risk to self with the past 6 months Suicidal Ideation: Yes-Currently Present Has patient been a risk to self within the past 6 months prior to admission? : Yes Suicidal Intent: No Has patient had any suicidal intent within the past 6 months prior to admission? : No Is patient at risk for suicide?: Yes Suicidal Plan?: No Has patient had any suicidal plan within the past 6 months prior to admission? : No Access to Means: No What has been your use of drugs/alcohol within the last 12 months?: reports to daily alcohol use Previous Attempts/Gestures: No Triggers for Past Attempts: None known Intentional Self Injurious Behavior: None Family Suicide History: No Recent stressful life event(s): Divorce, Financial Problems, Legal Issues Persecutory voices/beliefs?: No Depression: Yes Depression Symptoms: Despondent, Tearfulness, Isolating, Fatigue, Loss of interest in usual pleasures, Feeling worthless/self pity, Feeling angry/irritable Substance abuse history and/or treatment for substance abuse?: Yes Suicide prevention information given to non-admitted patients: Not applicable  Risk to Others within the past 6 months Homicidal Ideation: No Does patient have any lifetime risk of violence toward others beyond the six months prior to admission? : No Thoughts of Harm to Others: No Current Homicidal Intent:  No Current Homicidal Plan: No Access to Homicidal Means: No History of harm to others?: No Assessment of Violence: None Noted Does patient have access to weapons?: Yes (Comment)(pt owns a gun) Criminal Charges Pending?: No Does patient have a court date: No Is patient on probation?: No  Psychosis Hallucinations: None noted Delusions: None noted  Mental Status Report Appearance/Hygiene: Disheveled, In scrubs Eye Contact: Good Motor Activity: Freedom of movement Speech: Logical/coherent Level of Consciousness: Alert Mood: Depressed, Helpless, Worthless, low self-esteem, Despair Affect: Depressed, Sad Anxiety Level: None Thought Processes: Relevant, Coherent Judgement: Impaired Orientation: Person, Time, Place, Situation, Appropriate for developmental age Obsessive Compulsive Thoughts/Behaviors: None  Cognitive Functioning Concentration: Normal Memory: Remote Intact, Recent Intact Is patient IDD: No Is patient DD?: No Insight: Poor Impulse Control: Poor Appetite: Poor Have you had any weight changes? : Loss Amount of the weight change? (lbs): 10 lbs Sleep: Decreased Total Hours of Sleep: 4 Vegetative Symptoms: Staying in bed  ADLScreening [facility] Assessment Services) Patient's cognitive ability adequate to safely complete daily activities?: Yes Patient able to express need for assistance with ADLs?: Yes Independently performs ADLs?: Yes (appropriate for  developmental age)  Prior Inpatient Therapy Prior Inpatient Therapy: Yes Prior Therapy Dates: 2004 Prior Therapy Facilty/Provider(s): [facility] Reason for Treatment: substance abuse  Prior Outpatient Therapy Prior Outpatient Therapy: Yes Prior Therapy Dates: current Prior Therapy Facilty/Provider(s): [name] Reason for Treatment: med management Does patient have an ACCT team?: No Does patient have Intensive In-House Services?  : No Does patient have [name] services? : Yes Does patient have P4CC services?: No  ADL  Screening (condition at time of admission) Patient's cognitive ability adequate to safely complete daily activities?: Yes Is the patient deaf or have difficulty hearing?: No Does the patient have difficulty seeing, even when wearing glasses/contacts?: No Does the patient have difficulty concentrating, remembering, or making decisions?: No Patient able to express need for assistance with ADLs?: Yes Does the patient have difficulty dressing or bathing?: No Independently performs ADLs?: Yes (appropriate for developmental age) Does the patient have difficulty walking or climbing stairs?: Yes Weakness of Legs: Both Weakness of Arms/Hands: None  Home Assistive Devices/Equipment Home Assistive Devices/Equipment: None    Abuse/Neglect Assessment (Assessment to be complete while patient is alone) Abuse/Neglect Assessment Can Be Completed: Yes Physical Abuse: Denies Verbal Abuse: Denies Sexual Abuse: Denies Exploitation of patient/patient's resources: Denies Self-Neglect: Denies     Regulatory affairs officer (For Healthcare) Does Patient Have a Medical Advance Directive?: No Would patient like information on creating a medical advance directive?: No - Patient declined    Additional Information 1:1 In Past 12 Months?: No CIRT Risk: No Elopement Risk: No Does patient have medical clearance?: Yes     Disposition: Per Lindon Romp, NP pt is recommended for inpt treatment. [facility] is currently reviewing the pt for possible admission. EDP Domenic Moras, PA-C and pt's nurse Lonny Prude, RN have been advised of the disposition.   Disposition Initial Assessment Completed for this Encounter: Yes Disposition of Patient: Admit Type of inpatient treatment program: Adult(per Lindon Romp, NP) Patient refused recommended treatment: No  On Site Evaluation by:   Reviewed with Physician:    Lyanne Co 12/16/2017 8:21 PM

## 2017-12-16 NOTE — ED Notes (Signed)
Patient with visitor "mom"

## 2017-12-16 NOTE — Progress Notes (Signed)
Per Lindon Romp, NP pt is recommended for inpt treatment. [facility] is currently reviewing the pt for possible admission. EDP Domenic Moras, PA-C and pt's nurse Lonny Prude, RN have been advised of the disposition.   Lind Covert, MSW, LCSW Therapeutic Triage Specialist  [phone]

## 2017-12-16 NOTE — ED Notes (Signed)
Patient admitted on unit. Denies SI/HI, AH/VH at this time. Contracts for safety while on unit. Reports pain @ rib 5/10. Patient stated he fell and broke his rib few days ago when he was intoxicated. Superficial scratches noted on forehead, face and both legs.

## 2017-12-16 NOTE — ED Notes (Signed)
Bed: [facility] Expected date:  Expected time:  Means of arrival:  Comments: 

## 2017-12-16 NOTE — ED Provider Notes (Signed)
[facility] DEPT Provider Note   CSN: [id number] Arrival date & time: 12/16/17  1352     History   Chief Complaint Chief Complaint  Patient presents with  . Suicidal    HPI [name] [name] is a 57 y.o. male.  Who presents the emergency department chief complaint of suicidal ideation.  Patient states that he has been drinking for about 5 years after 1 year of sobriety.  He has been drinking heavily over the past year and states that he feels hopeless and wants to die but "I just do not know how to do it."  He states that he thinks about suicide daily that he is hopeless and tired of living.  He drinks 6-12 beers daily and had 616 ounce beers prior to arrival this morning.  He denies shakes when he does not get alcohol he is never had a withdrawal seizure.  He has no previous history of gastric hospitalization.  He has no family history of completed suicide.  He does not have access to weapon outside of the emergency department.  HPI  Past Medical History:  Diagnosis Date  . Barrett's esophagus dx 2016  . Bronchitis   . Depression   . Diabetes ([place])   . ED (erectile dysfunction)   . GERD (gastroesophageal reflux disease)   . Hepatitis C   . Hiatal hernia 10/2014   3cm  . Neuropathy     Patient Active Problem List   Diagnosis Date Noted  . Hereditary hemochromatosis ([place]) 11/23/2017  . 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 Essential hypertension 04/02/2017  . Hypogonadism male 08/13/2016  . ETOH abuse 06/22/2016  . Low serum testosterone level 01/08/2016  . Uncontrolled type 2 diabetes mellitus without complication, without long-term current use of insulin ([place]) 01/02/2016  . Erectile dysfunction 09/12/2015  . Hepatic cirrhosis ([place]) 01/10/2015  . Chronic hepatitis C without hepatic coma ([place]) 10/31/2014  . Tobacco use disorder 08/03/2013    Past Surgical History:  Procedure Laterality Date  . APPENDECTOMY    .  COLONOSCOPY N/A 02/16/2014   Procedure: COLONOSCOPY;  Surgeon: Gatha Mayer, MD;  Location: [facility] END[facility]COPY;  Service: Endoscopy;  Laterality: N/A;  . COLONOSCOPY    . TONSILLECTOMY          Home Medications    Prior to Admission medications   Medication Sig Start Date End Date Taking? Authorizing Provider  albuterol (VENTOLIN HFA) 108 (90 Base) MCG/ACT inhaler Inhale 2 puffs into the lungs every 6 (six) hours as needed for wheezing or shortness of breath. 04/04/17   Boykin Nearing, MD  aspirin EC 81 MG tablet Take 1 tablet (81 mg total) by mouth daily. 04/02/17   Funches, Adriana Mccallum, MD  atorvastatin (LIPITOR) 40 MG tablet Take 1 tablet (40 mg total) by mouth daily. 11/23/17   [name], NP  Blood Glucose Monitoring Suppl (TRUE METRIX METER) w/Device KIT 1 each by Does not apply route as needed. 02/18/16   Boykin Nearing, MD  canagliflozin (INVOKANA) 100 MG TABS tablet Take 1 tablet (100 mg total) by mouth daily before breakfast. 11/23/17   [name], NP  citalopram (CELEXA) 40 MG tablet Take 40 mg by mouth daily.    [provider]  glucose blood (TRUE METRIX BLOOD GLUCOSE TEST) test strip 1 each by Other route 3 (three) times daily. 02/18/16   Funches, Adriana Mccallum, MD  ibuprofen (ADVIL,MOTRIN) 600 MG tablet Take 1 tablet by mouth every 8 (eight) hours  as needed. 07/08/17   [provider]  losartan (COZAAR) 100 MG tablet Take 1 tablet (100 mg total) by mouth daily. 11/23/17   [name], NP  Multiple Vitamins-Minerals (MULTIVITAMIN ADULT) TABS Take 1 tablet by mouth daily. 04/02/17   Funches, Adriana Mccallum, MD  omeprazole (PRILOSEC) 20 MG capsule Take 1 capsule (20 mg total) by mouth daily. 11/23/17   [name], NP  pregabalin (LYRICA) 100 MG capsule Take 1 capsule (100 mg total) by mouth 2 (two) times daily. 11/23/17   [name], NP  TRUEPLUS LANCETS 28G MISC 1 each by Does not apply route 3 (three) times daily. 02/18/16   Funches, Adriana Mccallum, MD  zolpidem  (AMBIEN) 10 MG tablet Take 10 mg by mouth at bedtime as needed for sleep. At 2000    [provider]    Family History Family History  Problem Relation Age of Onset  . Cancer Mother        breast  . Diabetes Mother   . Heart disease Maternal Grandfather   . Pancreatic cancer Paternal Grandmother   . Stroke Father   . Alcohol abuse Father   . Pancreatic cancer Father   . Colon cancer Neg Hx   . Stomach cancer Neg Hx   . Rectal cancer Neg Hx   . Esophageal cancer Neg Hx     Social History Social History   Tobacco Use  . Smoking status: Current Every Day Smoker    Packs/day: 0.50    Years: 35.00    Pack years: 17.50    Types: Cigarettes  . Smokeless tobacco: Never Used  Substance Use Topics  . Alcohol use: Yes    Alcohol/week: 16.2 oz    Types: 6 Cans of beer, 21 Standard drinks or equivalent per week    Comment: 6 beers a week   . Drug use: No     Allergies   Patient has no known allergies.   Review of Systems Review of Systems  Ten systems reviewed and are negative for acute change, except as noted in the HPI.   Physical Exam Updated Vital Signs BP (!) 147/89 (BP Location: Left Arm)   Pulse 85   Temp 98.4 F (36.9 C) (Oral)   Resp 18   Ht _0  (1.803 m)   Wt 77.1 kg (170 lb)   SpO2 93%   BMI 23.71 kg/m   Physical Exam  Constitutional: He is oriented to person, place, and time. He appears well-developed and well-nourished. No distress.  HENT:  Head: Normocephalic and atraumatic.  Eyes: Conjunctivae are normal. No scleral icterus.  Neck: Normal range of motion. Neck supple.  Cardiovascular: Normal rate, regular rhythm and normal heart sounds.  Pulmonary/Chest: Effort normal and breath sounds normal. No respiratory distress.  Abdominal: Soft. There is no tenderness.  Musculoskeletal: He exhibits no edema.  Neurological: He is alert and oriented to person, place, and time.  Speech is slightly slurred appears clinically intoxicated able to  walk without ataxia  Skin: Skin is warm and dry. He is not diaphoretic.  Psychiatric: His behavior is normal.  Nursing note and vitals reviewed.    ED Treatments / Results  Labs (all labs ordered are listed, but only abnormal results are displayed) Labs Reviewed - No data to display  EKG None  Radiology No results found.  Procedures Procedures (including critical care time)  Medications Ordered in ED Medications - No data to display   Initial Impression / Assessment and Plan /  ED Course  I have reviewed the triage vital signs and the nursing notes.  Pertinent labs & imaging results that were available during my care of the patient were reviewed by me and considered in my medical decision making (see chart for details).     Currently clinically intoxicated.  He will need some time to sober up prior to TTS evaluation I have given sign out to PA Rona Ravens who will assume care of the patient for reevaluation and appropriate time he may have a TTS consult.  Final Clinical Impressions(s) / ED Diagnoses   Final diagnoses:  None    ED Discharge Orders    None       [name] Mail, PA-C 12/16/17 1546    Carmin Muskrat, MD 12/16/17 [phone]

## 2017-12-16 NOTE — ED Provider Notes (Signed)
Received signout at the beginning of shift from [place], [place].  Patient here with suicidal ideation without plan.  He reported increased alcohol consumption and consumes 6 beers a day.  He is currently intoxicated.  He denies homicidal ideation, auditory or visual hallucination.  He does not have a plan.  He is eating less, and sleeping less.  He took more Ambien than usual to try to help him sleep.  Patient report he is "tired of life".  We will continue to monitor patient, will initiate [place] protocol to decrease the risks of withdrawal and will consult TTS for further management once patient is more sober.   Date: 12/16/2017  Rate: 87  Rhythm: normal sinus rhythm  QRS Axis: normal  Intervals: normal  ST/T Wave abnormalities: normal  Conduction Disutrbances: none  Narrative Interpretation:   Old EKG Reviewed: No significant changes noted  8:09 PM TTS has evaluated pt and recommend inpt management.  Currently awaits for bed availability  Results for orders placed or performed during the hospital encounter of 12/16/17  Comprehensive metabolic panel  Result Value Ref Range   Sodium 139 135 - 145 mmol/L   Potassium 3.8 3.5 - 5.1 mmol/L   Chloride 101 101 - 111 mmol/L   CO2 26 22 - 32 mmol/L   Glucose, Bld 160 (H) 65 - 99 mg/dL   BUN <5 (L) 6 - 20 mg/dL   Creatinine, Ser 0.53 (L) 0.61 - 1.24 mg/dL   Calcium 9.0 8.9 - 10.3 mg/dL   Total Protein 8.1 6.5 - 8.1 g/dL   Albumin 4.1 3.5 - 5.0 g/dL   AST 83 (H) 15 - 41 U/L   ALT 73 (H) 17 - 63 U/L   Alkaline Phosphatase 77 38 - 126 U/L   Total Bilirubin 0.4 0.3 - 1.2 mg/dL   GFR calc non Af Amer >60 >60 mL/min   GFR calc Af Amer >60 >60 mL/min   Anion gap 12 5 - 15  Ethanol  Result Value Ref Range   Alcohol, Ethyl (B) 278 (H) <10 mg/dL  Urine rapid drug screen (hosp performed)  Result Value Ref Range   Opiates NONE DETECTED NONE DETECTED   Cocaine NONE DETECTED NONE DETECTED   Benzodiazepines NONE DETECTED NONE DETECTED   Amphetamines NONE DETECTED NONE DETECTED   Tetrahydrocannabinol NONE DETECTED NONE DETECTED   Barbiturates NONE DETECTED NONE DETECTED  CBC with Diff  Result Value Ref Range   WBC 8.4 4.0 - 10.5 K/uL   RBC 4.95 4.22 - 5.81 MIL/uL   Hemoglobin 16.3 13.0 - 17.0 g/dL   HCT 46.2 39.0 - 52.0 %   MCV 93.3 78.0 - 100.0 fL   MCH 32.9 26.0 - 34.0 pg   MCHC 35.3 30.0 - 36.0 g/dL   RDW 12.3 11.5 - 15.5 %   Platelets 230 150 - 400 K/uL   Neutrophils Relative % 43 %   Neutro Abs 3.7 1.7 - 7.7 K/uL   Lymphocytes Relative 41 %   Lymphs Abs 3.5 0.7 - 4.0 K/uL   Monocytes Relative 13 %   Monocytes Absolute 1.1 (H) 0.1 - 1.0 K/uL   Eosinophils Relative 2 %   Eosinophils Absolute 0.2 0.0 - 0.7 K/uL   Basophils Relative 1 %   Basophils Absolute 0.0 0.0 - 0.1 K/uL  Acetaminophen level  Result Value Ref Range   Acetaminophen (Tylenol), Serum <10 (L) 10 - 30 ug/mL  Salicylate level  Result Value Ref Range   Salicylate Lvl <6.2 2.8 -  30.0 mg/dL  CBG monitoring, ED  Result Value Ref Range   Glucose-Capillary 135 (H) 65 - 99 mg/dL  I-Stat Chem 8, ED  Result Value Ref Range   Sodium 139 135 - 145 mmol/L   Potassium 3.9 3.5 - 5.1 mmol/L   Chloride 100 (L) 101 - 111 mmol/L   BUN <3 (L) 6 - 20 mg/dL   Creatinine, Ser 0.80 0.61 - 1.24 mg/dL   Glucose, Bld 157 (H) 65 - 99 mg/dL   Calcium, Ion 0.98 (L) 1.15 - 1.40 mmol/L   TCO2 27 22 - 32 mmol/L   Hemoglobin 16.7 13.0 - 17.0 g/dL   HCT 49.0 39.0 - 52.0 %   No results found.    Domenic Moras, PA-C 12/16/17 2009    Daleen Bo, MD 12/16/17 [phone]

## 2017-12-16 NOTE — ED Triage Notes (Signed)
Patient brought in by [employer] voluntarily. Pt states he's been drinking alcohol and having SI thoughts.

## 2017-12-17 DIAGNOSIS — F17210 Nicotine dependence, cigarettes, uncomplicated: Secondary | ICD-10-CM

## 2017-12-17 DIAGNOSIS — F1014 Alcohol abuse with alcohol-induced mood disorder: Secondary | ICD-10-CM | POA: Diagnosis present

## 2017-12-17 DIAGNOSIS — Z811 Family history of alcohol abuse and dependence: Secondary | ICD-10-CM

## 2017-12-17 LAB — CBG MONITORING, ED: Glucose-Capillary: 164 mg/dL — ABNORMAL HIGH (ref 65–99)

## 2017-12-17 MED ORDER — INSULIN ASPART 100 UNIT/ML ~~LOC~~ SOLN: 0.0000 [IU] | Freq: Every day | SUBCUTANEOUS | 11 refills | Status: DC

## 2017-12-17 MED ORDER — INSULIN ASPART 100 UNIT/ML ~~LOC~~ SOLN: 0.0000 [IU] | Freq: Three times a day (TID) | SUBCUTANEOUS | 11 refills | Status: DC

## 2017-12-17 NOTE — Consult Note (Addendum)
[facility] Consult   Reason for Consult:  Alcohol intoxication with passive suicidal ideations Referring Physician:  EDP Patient Identification: [name] [name] MRN:  [id number] Principal Diagnosis: Alcohol abuse with alcohol-induced mood disorder [facility]) Diagnosis:   Patient Active Problem List   Diagnosis Date Noted  . Alcohol abuse with alcohol-induced mood disorder ([place]) [F10.14] 12/17/2017    Priority: High  . Hereditary hemochromatosis ([place]) [E83.110] 11/23/2017  . 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 Essential hypertension [I10] 04/02/2017  . Hypogonadism male [E29.1] 08/13/2016  . ETOH abuse [F10.10] 06/22/2016  . Low serum testosterone level [R79.89] 01/08/2016  . Uncontrolled type 2 diabetes mellitus without complication, without long-term current use of insulin ([place]) [E11.65] 01/02/2016  . Erectile dysfunction [N52.9] 09/12/2015  . Hepatic cirrhosis ([place]) [K74.60] 01/10/2015  . Chronic hepatitis C without hepatic coma ([place]) [B18.2] 10/31/2014  . Tobacco use disorder [F17.200] 08/03/2013    Total Time spent with patient: 45 minutes  Subjective:   [name] [name] is a 57 y.o. male patient does not warrant admission.  HPI:  57 yo male who presented to the ED under the influence of alcohol with passive suicidal ideations.  Today, he is clear and coherent with no suicidal/homicidal ideations, hallucinations, or withdrawal symptoms.  He met with Peer Support but was not interested in going to recovery or rehab.  Agreed to go to [place] and connect again with his sponsor.  Past Psychiatric History: alcohol dependence  Risk to Self: None Risk to Others: None Prior Inpatient Therapy: Prior Inpatient Therapy: Yes Prior Therapy Dates: 2004 Prior Therapy Facilty/Provider(s): [facility] Reason for Treatment: substance abuse Prior Outpatient Therapy: Prior Outpatient Therapy: Yes Prior Therapy Dates: current Prior Therapy Facilty/Provider(s):  [name] Reason for Treatment: med management Does patient have an ACCT team?: No Does patient have Intensive In-House Services?  : No Does patient have [name] services? : Yes Does patient have P4CC services?: No  Past Medical History:  Past Medical History:  Diagnosis Date  . Barrett's esophagus dx 2016  . Bronchitis   . Depression   . Diabetes ([place])   . ED (erectile dysfunction)   . GERD (gastroesophageal reflux disease)   . Hepatitis C   . Hiatal hernia 10/2014   3cm  . Neuropathy     Past Surgical History:  Procedure Laterality Date  . APPENDECTOMY    . COLONOSCOPY N/A 02/16/2014   Procedure: COLONOSCOPY;  Surgeon: Gatha Mayer, MD;  Location: [facility] ENDOSCOPY;  Service: Endoscopy;  Laterality: N/A;  . COLONOSCOPY    . TONSILLECTOMY     Family History:  Family History  Problem Relation Age of Onset  . Cancer Mother        breast  . Diabetes Mother   . Heart disease Maternal Grandfather   . Pancreatic cancer Paternal Grandmother   . Stroke Father   . Alcohol abuse Father   . Pancreatic cancer Father   . Colon cancer Neg Hx   . Stomach cancer Neg Hx   . Rectal cancer Neg Hx   . Esophageal cancer Neg Hx    Family Psychiatric  History: father with alcohol abuse Social History:  Social History   Substance and Sexual Activity  Alcohol Use Yes  . Alcohol/week: 16.2 oz  . Types: 6 Cans of beer, 21 Standard drinks or equivalent per week   Comment: 6 beers a week      Social History   Substance and Sexual Activity  Drug Use No  Social History   Socioeconomic History  . Marital status: Single    Spouse name: Not on file  . Number of children: Not on file  . Years of education: Not on file  . Highest education level: Not on file  Occupational History  . Not on file  Social Needs  . Financial resource strain: Not on file  . Food insecurity:    Worry: Not on file    Inability: Not on file  . Transportation needs:    Medical: Not on file     Non-medical: Not on file  Tobacco Use  . Smoking status: Current Every Day Smoker    Packs/day: 0.50    Years: 35.00    Pack years: 17.50    Types: Cigarettes  . Smokeless tobacco: Never Used  Substance and Sexual Activity  . Alcohol use: Yes    Alcohol/week: 16.2 oz    Types: 6 Cans of beer, 21 Standard drinks or equivalent per week    Comment: 6 beers a week   . Drug use: No  . Sexual activity: Not Currently  Lifestyle  . Physical activity:    Days per week: Not on file    Minutes per session: Not on file  . Stress: Not on file  Relationships  . Social connections:    Talks on phone: Not on file    Gets together: Not on file    Attends religious service: Not on file    Active member of club or organization: Not on file    Attends meetings of clubs or organizations: Not on file    Relationship status: Not on file  Other Topics Concern  . Not on file  Social History Narrative  . Not on file   Additional Social History: N/A    Allergies:   Allergies  Allergen Reactions  . Bee Venom Anaphylaxis    Labs:  Results for orders placed or performed during the hospital encounter of 12/16/17 (from the past 48 hour(s))  Urine rapid drug screen (hosp performed)     Status: None   Collection Time: 12/16/17  2:47 PM  Result Value Ref Range   Opiates NONE DETECTED NONE DETECTED   Cocaine NONE DETECTED NONE DETECTED   Benzodiazepines NONE DETECTED NONE DETECTED   Amphetamines NONE DETECTED NONE DETECTED   Tetrahydrocannabinol NONE DETECTED NONE DETECTED   Barbiturates NONE DETECTED NONE DETECTED    Comment: (NOTE) DRUG SCREEN FOR MEDICAL PURPOSES ONLY.  IF CONFIRMATION IS NEEDED FOR ANY PURPOSE, NOTIFY LAB WITHIN 5 DAYS. LOWEST DETECTABLE LIMITS FOR URINE DRUG SCREEN Drug Class                     Cutoff (ng/mL) Amphetamine and metabolites    1000 Barbiturate and metabolites    200 Benzodiazepine                 428 Tricyclics and metabolites     300 Opiates and  metabolites        300 Cocaine and metabolites        300 THC                            50 Performed at [facility], [place] [address] [place], [place] [address]   Comprehensive metabolic panel     Status: Abnormal   Collection Time: 12/16/17  3:14 PM  Result Value Ref Range   Sodium 139 135 -  145 mmol/L   Potassium 3.8 3.5 - 5.1 mmol/L   Chloride 101 101 - 111 mmol/L   CO2 26 22 - 32 mmol/L   Glucose, Bld 160 (H) 65 - 99 mg/dL   BUN <5 (L) 6 - 20 mg/dL   Creatinine, Ser 0.53 (L) 0.61 - 1.24 mg/dL   Calcium 9.0 8.9 - 10.3 mg/dL   Total Protein 8.1 6.5 - 8.1 g/dL   Albumin 4.1 3.5 - 5.0 g/dL   AST 83 (H) 15 - 41 U/L   ALT 73 (H) 17 - 63 U/L   Alkaline Phosphatase 77 38 - 126 U/L   Total Bilirubin 0.4 0.3 - 1.2 mg/dL   GFR calc non Af Amer >60 >60 mL/min   GFR calc Af Amer >60 >60 mL/min    Comment: (NOTE) The eGFR has been calculated using the CKD EPI equation. This calculation has not been validated in all clinical situations. eGFR's persistently <60 mL/min signify possible Chronic Kidney Disease.    Anion gap 12 5 - 15    Comment: Performed at [facility], [place] [address] [place], [place] [address]  CBC with Diff     Status: Abnormal   Collection Time: 12/16/17  3:14 PM  Result Value Ref Range   WBC 8.4 4.0 - 10.5 K/uL   RBC 4.95 4.22 - 5.81 MIL/uL   Hemoglobin 16.3 13.0 - 17.0 g/dL   HCT 46.2 39.0 - 52.0 %   MCV 93.3 78.0 - 100.0 fL   MCH 32.9 26.0 - 34.0 pg   MCHC 35.3 30.0 - 36.0 g/dL   RDW 12.3 11.5 - 15.5 %   Platelets 230 150 - 400 K/uL   Neutrophils Relative % 43 %   Neutro Abs 3.7 1.7 - 7.7 K/uL   Lymphocytes Relative 41 %   Lymphs Abs 3.5 0.7 - 4.0 K/uL   Monocytes Relative 13 %   Monocytes Absolute 1.1 (H) 0.1 - 1.0 K/uL   Eosinophils Relative 2 %   Eosinophils Absolute 0.2 0.0 - 0.7 K/uL   Basophils Relative 1 %   Basophils Absolute 0.0 0.0 - 0.1 K/uL    Comment: Performed at [facility], [place] [address], [place], [place] [address]  Ethanol     Status: Abnormal   Collection Time: 12/16/17  3:15 PM  Result Value Ref Range   Alcohol, Ethyl (B) 278 (H) <10 mg/dL    Comment:        LOWEST DETECTABLE LIMIT FOR SERUM ALCOHOL IS 10 mg/dL FOR MEDICAL PURPOSES ONLY Performed at [facility] [address] [place], [place] [address]   Acetaminophen level     Status: Abnormal   Collection Time: 12/16/17  3:15 PM  Result Value Ref Range   Acetaminophen (Tylenol), Serum <10 (L) 10 - 30 ug/mL    Comment:        THERAPEUTIC CONCENTRATIONS VARY SIGNIFICANTLY. A RANGE OF 10-30 ug/mL MAY BE AN EFFECTIVE CONCENTRATION FOR MANY PATIENTS. HOWEVER, SOME ARE BEST TREATED AT CONCENTRATIONS OUTSIDE THIS RANGE. ACETAMINOPHEN CONCENTRATIONS >150 ug/mL AT 4 HOURS AFTER INGESTION AND >50 ug/mL AT 12 HOURS AFTER INGESTION ARE OFTEN ASSOCIATED WITH TOXIC REACTIONS. Performed at [facility], [place] [address] [place], [place] [address]   Salicylate level     Status: None   Collection Time: 12/16/17  3:15 PM  Result Value Ref Range   Salicylate Lvl <2.9 2.8 - 30.0 mg/dL    Comment: Performed at [facility], [place] [address], [place], [place] [address]  I-Stat Chem 8, ED     Status: Abnormal   Collection Time: 12/16/17  3:26 PM  Result Value Ref Range   Sodium 139 135 - 145 mmol/L   Potassium 3.9 3.5 - 5.1 mmol/L   Chloride 100 (L) 101 - 111 mmol/L   BUN <3 (L) 6 - 20 mg/dL   Creatinine, Ser 0.80 0.61 - 1.24 mg/dL   Glucose, Bld 157 (H) 65 - 99 mg/dL   Calcium, Ion 0.98 (L) 1.15 - 1.40 mmol/L   TCO2 27 22 - 32 mmol/L   Hemoglobin 16.7 13.0 - 17.0 g/dL   HCT 49.0 39.0 - 52.0 %  CBG monitoring, ED     Status: Abnormal   Collection Time: 12/16/17  4:48 PM  Result Value Ref Range   Glucose-Capillary 135 (H) 65 - 99 mg/dL  CBG monitoring, ED     Status: Abnormal   Collection Time: 12/16/17  8:39 PM  Result Value Ref Range    Glucose-Capillary 242 (H) 65 - 99 mg/dL  CBG monitoring, ED     Status: Abnormal   Collection Time: 12/17/17  7:50 AM  Result Value Ref Range   Glucose-Capillary 164 (H) 65 - 99 mg/dL   Comment 1 Notify RN     Current Facility-Administered Medications  Medication Dose Route Frequency Provider Last Rate Last Dose  . acetaminophen (TYLENOL) tablet 650 mg  650 mg Oral Q4H PRN Daleen Bo, MD   650 mg at 12/17/17 1012  . alum & mag hydroxide-simeth (MAALOX/MYLANTA) 200-200-20 MG/5ML suspension 30 mL  30 mL Oral Q6H PRN Domenic Moras, PA-C      . aspirin EC tablet 81 mg  81 mg Oral Daily Domenic Moras, PA-C   81 mg at 12/17/17 1013  . atorvastatin (LIPITOR) tablet 40 mg  40 mg Oral Daily Domenic Moras, PA-C   40 mg at 12/17/17 1012  . citalopram (CELEXA) tablet 40 mg  40 mg Oral Daily Domenic Moras, PA-C   40 mg at 12/17/17 1013  . insulin aspart (novoLOG) injection 0-15 Units  0-15 Units Subcutaneous TID WC Domenic Moras, PA-C   3 Units at 12/17/17 0805  . insulin aspart (novoLOG) injection 0-5 Units  0-5 Units Subcutaneous QHS Domenic Moras, PA-C   2 Units at 12/16/17 2122  . LORazepam (ATIVAN) injection 0-4 mg  0-4 mg Intravenous Q6H Domenic Moras, PA-C       Or  . LORazepam (ATIVAN) tablet 0-4 mg  0-4 mg Oral Q6H Domenic Moras, PA-C   1 mg at 12/16/17 2312  . [START ON 12/19/2017] LORazepam (ATIVAN) injection 0-4 mg  0-4 mg Intravenous Q12H Domenic Moras, PA-C       Or  . Derrill Memo ON 12/19/2017] LORazepam (ATIVAN) tablet 0-4 mg  0-4 mg Oral Q12H Domenic Moras, PA-C      . losartan (COZAAR) tablet 100 mg  100 mg Oral Daily Domenic Moras, PA-C   100 mg at 12/17/17 1013  . nicotine (NICODERM CQ - dosed in mg/24 hours) patch 21 mg  21 mg Transdermal Daily Harris, Abigail, PA-C   21 mg at 12/17/17 1028  . ondansetron (ZOFRAN) tablet 4 mg  4 mg Oral Q8H PRN Domenic Moras, PA-C      . thiamine (VITAMIN B-1) tablet 100 mg  100 mg Oral Daily Domenic Moras, PA-C   100 mg at 12/17/17 1012   Or  . thiamine (B-1) injection 100  mg  100 mg Intravenous  Daily Domenic Moras, PA-C       Current Outpatient Medications  Medication Sig Dispense Refill  . albuterol (VENTOLIN HFA) 108 (90 Base) MCG/ACT inhaler Inhale 2 puffs into the lungs every 6 (six) hours as needed for wheezing or shortness of breath. 18 each 0  . aspirin EC 81 MG tablet Take 1 tablet (81 mg total) by mouth daily. 30 tablet 11  . atorvastatin (LIPITOR) 40 MG tablet Take 1 tablet (40 mg total) by mouth daily. 90 tablet 1  . canagliflozin (INVOKANA) 100 MG TABS tablet Take 1 tablet (100 mg total) by mouth daily before breakfast. 30 tablet 3  . citalopram (CELEXA) 40 MG tablet Take 40 mg by mouth daily.    Marland Kitchen losartan (COZAAR) 100 MG tablet Take 1 tablet (100 mg total) by mouth daily. 90 tablet 1  . Multiple Vitamins-Minerals (MULTIVITAMIN ADULT) TABS Take 1 tablet by mouth daily. 30 tablet 11  . omeprazole (PRILOSEC) 20 MG capsule Take 1 capsule (20 mg total) by mouth daily. 90 capsule 1  . pregabalin (LYRICA) 100 MG capsule Take 1 capsule (100 mg total) by mouth 2 (two) times daily. 60 capsule 0  . zolpidem (AMBIEN) 10 MG tablet Take 10 mg by mouth at bedtime as needed for sleep. At 2000    . Blood Glucose Monitoring Suppl (TRUE METRIX METER) w/Device KIT 1 each by Does not apply route as needed. 1 kit 0  . glucose blood (TRUE METRIX BLOOD GLUCOSE TEST) test strip 1 each by Other route 3 (three) times daily. 100 each 11  . ibuprofen (ADVIL,MOTRIN) 600 MG tablet Take 1 tablet by mouth every 8 (eight) hours as needed.  0  . TRUEPLUS LANCETS 28G MISC 1 each by Does not apply route 3 (three) times daily. 100 each 11    Musculoskeletal: Strength & Muscle Tone: within normal limits Gait & Station: normal Patient leans: N/A  Psychiatric Specialty Exam: Physical Exam  Nursing note and vitals reviewed. Constitutional: He is oriented to person, place, and time. He appears well-developed and well-nourished.  HENT:  Head: Normocephalic and atraumatic.  Neck:  Normal range of motion.  Respiratory: Effort normal.  Musculoskeletal: Normal range of motion.  Neurological: He is alert and oriented to person, place, and time.  Psychiatric: He has a normal mood and affect. His speech is normal and behavior is normal. Judgment and thought content normal. Cognition and memory are normal.    Review of Systems  Psychiatric/Behavioral: Positive for substance abuse.  All other systems reviewed and are negative.   Blood pressure (!) 166/93, pulse 81, temperature 98.6 F (37 C), temperature source Oral, resp. rate 18, height _0  (1.803 m), weight 77.1 kg (170 lb), SpO2 93 %.Body mass index is 23.71 kg/m.  General Appearance: Casual  Eye Contact:  Good  Speech:  Normal Rate  Volume:  Normal  Mood:  Euthymic  Affect:  Congruent  Thought Process:  Coherent and Descriptions of Associations: Intact  Orientation:  Full (Time, Place, and Person)  Thought Content:  WDL and Logical  Suicidal Thoughts:  No  Homicidal Thoughts:  No  Memory:  Immediate;   Good Recent;   Good Remote;   Good  Judgement:  Fair  Insight:  Fair  Psychomotor Activity:  Normal  Concentration:  Concentration: Good and Attention Span: Good  Recall:  Good  Fund of Knowledge:  Good  Language:  Good  Akathisia:  No  Handed:  Right  AIMS (if indicated):   N/A  Assets:  Leisure Time Physical Health Resilience Social Support  ADL's:  Intact  Cognition:  WNL  Sleep:   N/A     Treatment Plan Summary: Daily contact with patient to assess and evaluate symptoms and progress in treatment, Medication management and Plan alcohol abuse with alcohol induced mood disorder:  -Crisis stabilization -Medication management:  Started Ativan alcohol detox protocol and continued medical medications and Celexa 40 mg daily for depression and anxiety -Individual counseling  Disposition: No evidence of imminent risk to self or others at present.    Waylan Boga, NP 12/17/2017 11:50 AM    Patient seen face-to-face for psychiatric evaluation, chart reviewed and case discussed with the physician extender and developed treatment plan. Reviewed the information documented and agree with the treatment plan.  Buford Dresser, DO 12/17/17 1:25 PM

## 2017-12-17 NOTE — Patient Outreach (Signed)
[facility]PSS met with the patient and provided substance use recovery support. Patient's plan is to attend AA meetings again and meet with his old sponsor again. CPSS highlighted the patient strengths and how recovery is possible. Patient is also interested in outpatient treatment, so CPSS will provide outpatient resources. CPSS will also provided [facility]PSS contact information. CPSS highly encouraged the patient to contact CPSS at anytime for substance use recovery support or help with substance use recovery resources.

## 2017-12-17 NOTE — [facility] Suicide Risk Assessment (Signed)
Suicide Risk Assessment  Discharge Assessment   [facility] Discharge Suicide Risk Assessment   Principal Problem: Alcohol abuse with alcohol-induced mood disorder [facility]) Discharge Diagnoses:  Patient Active Problem List   Diagnosis Date Noted  . Alcohol abuse with alcohol-induced mood disorder ([place]) [F10.14] 12/17/2017    Priority: High  . Hereditary hemochromatosis ([place]) [E83.110] 11/23/2017  . 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 Essential hypertension [I10] 04/02/2017  . Hypogonadism male [E29.1] 08/13/2016  . ETOH abuse [F10.10] 06/22/2016  . Low serum testosterone level [R79.89] 01/08/2016  . Uncontrolled type 2 diabetes mellitus without complication, without long-term current use of insulin ([place]) [E11.65] 01/02/2016  . Erectile dysfunction [N52.9] 09/12/2015  . Hepatic cirrhosis ([place]) [K74.60] 01/10/2015  . Chronic hepatitis C without hepatic coma ([place]) [B18.2] 10/31/2014  . Tobacco use disorder [F17.200] 08/03/2013    Total Time spent with patient: 45 minutes  Musculoskeletal: Strength & Muscle Tone: within normal limits Gait & Station: normal Patient leans: N/A  Psychiatric Specialty Exam: Physical Exam  Constitutional: He is oriented to person, place, and time. He appears well-developed and well-nourished.  HENT:  Head: Normocephalic.  Neck: Normal range of motion.  Respiratory: Effort normal.  Musculoskeletal: Normal range of motion.  Neurological: He is alert and oriented to person, place, and time.  Psychiatric: He has a normal mood and affect. His speech is normal and behavior is normal. Judgment and thought content normal. Cognition and memory are normal.    Review of Systems  Psychiatric/Behavioral: Positive for substance abuse.  All other systems reviewed and are negative.   Blood pressure (!) 166/93, pulse 81, temperature 98.6 F (37 C), temperature source Oral, resp. rate 18, height 5\' 11"  (1.803 m), weight 77.1 kg (170 lb), SpO2  93 %.Body mass index is 23.71 kg/m.  General Appearance: Casual  Eye Contact:  Good  Speech:  Normal Rate  Volume:  Normal  Mood:  Euthymic  Affect:  Congruent  Thought Process:  Coherent and Descriptions of Associations: Intact  Orientation:  Full (Time, Place, and Person)  Thought Content:  WDL and Logical  Suicidal Thoughts:  No  Homicidal Thoughts:  No  Memory:  Immediate;   Good Recent;   Good Remote;   Good  Judgement:  Fair  Insight:  Fair  Psychomotor Activity:  Normal  Concentration:  Concentration: Good and Attention Span: Good  Recall:  Good  Fund of Knowledge:  Good  Language:  Good  Akathisia:  No  Handed:  Right  AIMS (if indicated):     Assets:  Leisure Time Physical Health Resilience Social Support  ADL's:  Intact  Cognition:  WNL  Sleep:       Mental Status Per Nursing Assessment::   On Admission:   alcohol intoxication with passive suicidal ideations  Demographic Factors:  Male and Caucasian  Loss Factors: NA  Historical Factors: NA  Risk Reduction Factors:   Sense of responsibility to family, Living with another person, especially a relative, Positive social support and Positive therapeutic relationship  Continued Clinical Symptoms:  None  Cognitive Features That Contribute To Risk:  None    Suicide Risk:  Minimal: No identifiable suicidal ideation.  Patients presenting with no risk factors but with morbid ruminations; may be classified as minimal risk based on the severity of the depressive symptoms    Plan Of Care/Follow-up recommendations:  Activity:  as tolerated Diet:  heart healthy diet  Fadia Marlar, Theodoro Clock, NP 12/17/2017, 12:39 PM

## 2017-12-17 NOTE — ED Notes (Signed)
Patient denies pain and is resting comfortably.  

## 2017-12-20 MED FILL — !NOVOLOG 100UNITS/ML VIAL: 100/ML | 22 days supply | Qty: 10 | Fill #0

## 2017-12-22 MED FILL — INVOKANA 100 MG TABLET: 100 | 30 days supply | Qty: 30 | Fill #7

## 2017-12-22 MED FILL — LOSARTAN POTASSIUM 100 MG T: 100 | 30 days supply | Qty: 30 | Fill #1

## 2017-12-22 MED FILL — ATORVASTATIN 40 MG TABLET: 40 | 30 days supply | Qty: 30 | Fill #1

## 2018-02-01 MED FILL — OMEPRAZOLE 20 MG CAP: 20 | 30 days supply | Qty: 30 | Fill #0

## 2018-02-01 MED FILL — ATORVASTATIN CALCIUM 40 MG: 40 | 30 days supply | Qty: 30 | Fill #2

## 2018-02-01 MED FILL — INVOKANA 100 MG TABLET: 100 | 30 days supply | Qty: 30 | Fill #8

## 2018-02-01 MED FILL — !NOVOLOG 100UNITS/ML VIAL: 100/ML | 22 days supply | Qty: 10 | Fill #1

## 2018-02-01 MED FILL — LOSARTAN POTASSIUM 100 MG T: 100 | 30 days supply | Qty: 30 | Fill #2

## 2018-02-09 ENCOUNTER — Other Ambulatory Visit: Payer: Self-pay | Admitting: Nurse Practitioner

## 2018-02-09 DIAGNOSIS — E1165 Type 2 diabetes mellitus with hyperglycemia: Principal | ICD-10-CM

## 2018-02-09 DIAGNOSIS — IMO0002 Reserved for concepts with insufficient information to code with codable children: Secondary | ICD-10-CM

## 2018-02-09 DIAGNOSIS — E1140 Type 2 diabetes mellitus with diabetic neuropathy, unspecified: Secondary | ICD-10-CM

## 2018-02-10 NOTE — Telephone Encounter (Signed)
Patient is reestablishing on 02/15/18 with you and is requesting a refill at this time.

## 2018-02-15 ENCOUNTER — Encounter: Payer: Self-pay | Admitting: Nurse Practitioner

## 2018-02-15 ENCOUNTER — Ambulatory Visit: Payer: Self-pay | Attending: Nurse Practitioner | Admitting: Nurse Practitioner

## 2018-02-15 VITALS — BP 155/89 | HR 72 | Temp 99.1°F | Ht 71.0 in | Wt 171.2 lb

## 2018-02-15 DIAGNOSIS — K219 Gastro-esophageal reflux disease without esophagitis: Secondary | ICD-10-CM | POA: Insufficient documentation

## 2018-02-15 DIAGNOSIS — F17210 Nicotine dependence, cigarettes, uncomplicated: Secondary | ICD-10-CM | POA: Insufficient documentation

## 2018-02-15 DIAGNOSIS — Z8619 Personal history of other infectious and parasitic diseases: Secondary | ICD-10-CM | POA: Insufficient documentation

## 2018-02-15 DIAGNOSIS — F17200 Nicotine dependence, unspecified, uncomplicated: Secondary | ICD-10-CM

## 2018-02-15 DIAGNOSIS — Z833 Family history of diabetes mellitus: Secondary | ICD-10-CM | POA: Insufficient documentation

## 2018-02-15 DIAGNOSIS — Z811 Family history of alcohol abuse and dependence: Secondary | ICD-10-CM | POA: Insufficient documentation

## 2018-02-15 DIAGNOSIS — Z91030 Bee allergy status: Secondary | ICD-10-CM | POA: Insufficient documentation

## 2018-02-15 DIAGNOSIS — E785 Hyperlipidemia, unspecified: Secondary | ICD-10-CM | POA: Insufficient documentation

## 2018-02-15 DIAGNOSIS — Z79899 Other long term (current) drug therapy: Secondary | ICD-10-CM | POA: Insufficient documentation

## 2018-02-15 DIAGNOSIS — E1165 Type 2 diabetes mellitus with hyperglycemia: Secondary | ICD-10-CM

## 2018-02-15 DIAGNOSIS — Z823 Family history of stroke: Secondary | ICD-10-CM | POA: Insufficient documentation

## 2018-02-15 DIAGNOSIS — Z794 Long term (current) use of insulin: Secondary | ICD-10-CM | POA: Insufficient documentation

## 2018-02-15 DIAGNOSIS — E1140 Type 2 diabetes mellitus with diabetic neuropathy, unspecified: Secondary | ICD-10-CM | POA: Insufficient documentation

## 2018-02-15 DIAGNOSIS — IMO0002 Reserved for concepts with insufficient information to code with codable children: Secondary | ICD-10-CM

## 2018-02-15 DIAGNOSIS — Z803 Family history of malignant neoplasm of breast: Secondary | ICD-10-CM | POA: Insufficient documentation

## 2018-02-15 DIAGNOSIS — I10 Essential (primary) hypertension: Secondary | ICD-10-CM | POA: Insufficient documentation

## 2018-02-15 DIAGNOSIS — F329 Major depressive disorder, single episode, unspecified: Secondary | ICD-10-CM | POA: Insufficient documentation

## 2018-02-15 DIAGNOSIS — Z7982 Long term (current) use of aspirin: Secondary | ICD-10-CM | POA: Insufficient documentation

## 2018-02-15 LAB — POCT GLYCOSYLATED HEMOGLOBIN (HGB A1C): HEMOGLOBIN A1C: 7.7 % — AB (ref 4.0–5.6)

## 2018-02-15 LAB — GLUCOSE, POCT (MANUAL RESULT ENTRY): POC GLUCOSE: 142 mg/dL — AB (ref 70–99)

## 2018-02-15 MED ORDER — VARENICLINE TARTRATE 1 MG PO TABS: 1.0000 mg | ORAL_TABLET | Freq: Two times a day (BID) | ORAL | 0 refills | Status: DC

## 2018-02-15 MED ORDER — VARENICLINE TARTRATE 0.5 MG PO TABS: ORAL_TABLET | ORAL | 0 refills | Status: DC

## 2018-02-15 NOTE — Progress Notes (Signed)
Assessment & Plan:  [name] [name] was seen today for follow-up.  Diagnoses and all orders for this visit:  Uncontrolled diabetes mellitus with diabetic neuropathy ([place]) -     Glucose (CBG) -     HgB A1c Continue blood sugar control as discussed in office today, low carbohydrate diet, and regular physical exercise as tolerated, 150 minutes per week (30 min each day, 5 days per week, or 50 min 3 days per week). Keep blood sugar logs with fasting goal of 80-130 mg/dl, post prandial less than 180.  For Hypoglycemia: BS <60 and Hyperglycemia BS >400; contact the clinic ASAP. Annual eye exams and foot exams are recommended.  Essential hypertension -     CMP14+EGFR Continue all antihypertensives as prescribed.  Remember to bring in your blood pressure log with you for your follow up appointment.  DASH/Mediterranean Diets are healthier choices for HTN.   Tobacco use disorder -     varenicline (CHANTIX) 0.5 MG tablet; Days 1-3 take 1 tablet by mouth daily. Day 4-7 take 1 tablet by mouth twice daily. -     varenicline (CHANTIX) 1 MG tablet; Take 1 tablet (1 mg total) by mouth 2 (two) times daily. 1. Xzavior continues to smoke up to 2ppd if cigarettes 2. Aly was counseled on the dangers of tobacco use, and was advised to quit. We reviewed specific strategies to maximize success, including removing cigarettes and smoking materials from environment, stress management and support of family/friends as well as pharmacological alternatives. 3. A total of 5 minutes was spent on counseling for smoking cessation and Tobe is ready to quit and has chosen CHANTIX to start today.  4. Man was offered Wellbutrin,  Nicotine patch, Nicotine gum or lozenges.  Due to out of pocket costs Arjan was also given smoking cessation support and advised to contact: the Smoking Cessation hotline: 1-800-QUIT-NOW.  Kamdin was also informed of our Smoking cessation classes which are also available through [facility] by calling [phone] or visit our website at [email or web address]  5. Will follow up at next scheduled office visit.     Patient has been counseled on age-appropriate routine health concerns for screening and prevention. These are reviewed and up-to-date. Referrals have been placed accordingly. Immunizations are up-to-date or declined.    Subjective:   Chief Complaint  Patient presents with  . Follow-up    Pt. is here to follow-up on diabetes.    HPI [name] [name] 57 y.o. male presents to office today for follow up to DM, HTN, HPL.  He was seen in the emergency room on 12/16/2017 with suicidal ideation and EtOH abuse.  He was evaluated by psychiatry however he was not interested to recovery at rehab and he agreed to go to [place] and connect with his sponsor and was discharged on 12/17/2017.  Today he reports he just left an AA meeting this morning prior to his office visit. He is doing well overall.     Essential Hypertension Chronic. Not well controlled today. He endorses medication compliance taking losartan 179m daily. States he did not sleep well last night and feels that is why his blood pressure is up today. He does not check his blood pressure at home is reported to monitor his blood pressure monitor that he used to check his blood pressure if needed.  Instructed him to check his blood pressure at least 3times a week and if he notes any blood pressures consistently greater than 140/90 he needs to  call the office.  We will follow-up with a repeat blood pressure reading in 3 weeks in this office.  Denies chest pain, shortness of breath, palpitations, lightheadedness, dizziness, headaches or BLE edema.  BP Readings from Last 3 Encounters:  02/15/18 (!) 155/89  12/17/17 (!) 151/83  12/07/17 129/79   Hyperlipidemia Patient presents for follow up to hyperlipidemia.  He is medication compliant. He is diet compliant and denies poor exercise tolerance and skin xanthelasma or  statin intolerance including myalgias.  Lab Results  Component Value Date   CHOL 238 (H) 04/02/2017   Lab Results  Component Value Date   HDL 57 04/02/2017   Lab Results  Component Value Date   LDLCALC 157 (H) 04/02/2017   Lab Results  Component Value Date   TRIG 119 04/02/2017   Lab Results  Component Value Date   CHOLHDL 4.2 04/02/2017    Type 2 Diabetes Mellitus Disease course has been are worsening. There are no hypoglycemic symptoms. There are no hypoglycemic complications. Symptoms are stable. There are diabetic complications. Risk factors for coronary artery disease include family history, dyslipidemia, diabetes mellitus, obesity, hypertension, sedentary lifestyle and stress. Current diabetic treatment includes Invokana 176m daily. Patient is compliant with treatment all of the time and does not monitor his blood glucose at home. Weight is  stable. Patient follows a generally healthy diet. Meal planning includes avoidance of concentrated sweets. Patient has not seen a dietician. Patient is not compliant with exercise.   An ACE inhibitor/angiotensin II receptor blocker is being taken. Patient does not see a podiatrist. Eye exam is not current.  Lab Results  Component Value Date   HGBA1C 7.7 (A) 02/15/2018   Review of Systems  Constitutional: Negative for fever, malaise/fatigue and weight loss.  HENT: Negative.  Negative for nosebleeds.   Eyes: Negative.  Negative for blurred vision, double vision and photophobia.  Respiratory: Negative.  Negative for cough and shortness of breath.   Cardiovascular: Negative.  Negative for chest pain, palpitations and leg swelling.  Gastrointestinal: Negative.  Negative for heartburn, nausea and vomiting.  Musculoskeletal: Negative.  Negative for myalgias.  Neurological: Negative.  Negative for dizziness, focal weakness, seizures and headaches.  Psychiatric/Behavioral: Positive for substance abuse. Negative for suicidal ideas.    Past  Medical History:  Diagnosis Date  . Barrett's esophagus dx 2016  . Bronchitis   . Depression   . Diabetes ([place]   . ED (erectile dysfunction)   . GERD (gastroesophageal reflux disease)   . Hepatitis C   . Hiatal hernia 10/2014   3cm  . Neuropathy     Past Surgical History:  Procedure Laterality Date  . APPENDECTOMY    . COLONOSCOPY N/A 02/16/2014   Procedure: COLONOSCOPY;  Surgeon: CGatha Mayer MD;  Location: [facility];  Service: Endoscopy;  Laterality: N/A;  . COLONOSCOPY    . TONSILLECTOMY      Family History  Problem Relation Age of Onset  . Cancer Mother        breast  . Diabetes Mother   . Heart disease Maternal Grandfather   . Pancreatic cancer Paternal Grandmother   . Stroke Father   . Alcohol abuse Father   . Pancreatic cancer Father   . Colon cancer Neg Hx   . Stomach cancer Neg Hx   . Rectal cancer Neg Hx   . Esophageal cancer Neg Hx     Social History Reviewed with no changes to be made today.   Outpatient Medications Prior to Visit  Medication Sig Dispense Refill  . albuterol (VENTOLIN HFA) 108 (90 Base) MCG/ACT inhaler Inhale 2 puffs into the lungs every 6 (six) hours as needed for wheezing or shortness of breath. 18 each 0  . aspirin EC 81 MG tablet Take 1 tablet (81 mg total) by mouth daily. 30 tablet 11  . atorvastatin (LIPITOR) 40 MG tablet Take 1 tablet (40 mg total) by mouth daily. 90 tablet 1  . canagliflozin (INVOKANA) 100 MG TABS tablet Take 1 tablet (100 mg total) by mouth daily before breakfast. 30 tablet 3  . citalopram (CELEXA) 40 MG tablet Take 40 mg by mouth daily.    Marland Kitchen losartan (COZAAR) 100 MG tablet Take 1 tablet (100 mg total) by mouth daily. 90 tablet 1  . Multiple Vitamins-Minerals (MULTIVITAMIN ADULT) TABS Take 1 tablet by mouth daily. 30 tablet 11  . omeprazole (PRILOSEC) 20 MG capsule Take 1 capsule (20 mg total) by mouth daily. 90 capsule 1  . pregabalin (LYRICA) 100 MG capsule Take 1 capsule (100 mg total) by mouth 2 (two)  times daily. 60 capsule 0  . TRUEPLUS LANCETS 28G MISC 1 each by Does not apply route 3 (three) times daily. 100 each 11  . ibuprofen (ADVIL,MOTRIN) 600 MG tablet Take 1 tablet by mouth every 8 (eight) hours as needed.  0  . insulin aspart (NOVOLOG) 100 UNIT/ML injection Inject 0-15 Units into the skin 3 (three) times daily with meals. (Patient not taking: Reported on 02/15/2018) 10 mL 11  . insulin aspart (NOVOLOG) 100 UNIT/ML injection Inject 0-5 Units into the skin at bedtime. (Patient not taking: Reported on 02/15/2018) 10 mL 11   No facility-administered medications prior to visit.     Allergies  Allergen Reactions  . Bee Venom Anaphylaxis       Objective:    BP (!) 155/89 (BP Location: Left Arm, Patient Position: Sitting, Cuff Size: Normal)   Pulse 72   Temp 99.1 F (37.3 C) (Oral)   Ht '5\' 11"'  (1.803 m)   Wt 171 lb 3.2 oz (77.7 kg)   SpO2 94%   BMI 23.88 kg/m  Wt Readings from Last 3 Encounters:  02/15/18 171 lb 3.2 oz (77.7 kg)  12/16/17 170 lb (77.1 kg)  11/23/17 179 lb 9.6 oz (81.5 kg)    Physical Exam  Constitutional: He is oriented to person, place, and time. He appears well-developed and well-nourished. He is cooperative.  HENT:  Head: Normocephalic and atraumatic.  Eyes: EOM are normal.  Neck: Normal range of motion.  Cardiovascular: Normal rate, regular rhythm and normal heart sounds. Exam reveals no gallop and no friction rub.  No murmur heard. Pulmonary/Chest: Effort normal and breath sounds normal. No tachypnea. No respiratory distress. He has no decreased breath sounds. He has no wheezes. He has no rhonchi. He has no rales. He exhibits no tenderness.  Abdominal: Soft. Bowel sounds are normal.  Musculoskeletal: Normal range of motion. He exhibits no edema.  Neurological: He is alert and oriented to person, place, and time. Coordination normal.  Skin: Skin is warm and dry.  Psychiatric: He has a normal mood and affect. His behavior is normal. Judgment and  thought content normal.  Nursing note and vitals reviewed.      Patient has been counseled extensively about nutrition and exercise as well as the importance of adherence with medications and regular follow-up. The patient was given clear instructions to go to ER or return to medical center if symptoms don't improve, worsen or new problems  develop. The patient verbalized understanding.   Follow-up: Return in about 3 weeks (around 03/08/2018) for BP recheck.   [name], FNP-BC [facility] [place], [place]   02/15/2018, 12:20 PM

## 2018-02-15 NOTE — Patient Instructions (Addendum)
Coping with Quitting Smoking Quitting smoking is a physical and mental challenge. You will face cravings, withdrawal symptoms, and temptation. Before quitting, work with your health care provider to make a plan that can help you cope. Preparation can help you quit and keep you from giving in. How can I cope with cravings? Cravings usually last for 5-10 minutes. If you get through it, the craving will pass. Consider taking the following actions to help you cope with cravings:  Keep your mouth busy: ? Chew sugar-free gum. ? Suck on hard candies or a straw. ? Brush your teeth.  Keep your hands and body busy: ? Immediately change to a different activity when you feel a craving. ? Squeeze or play with a ball. ? Do an activity or a hobby, like making bead jewelry, practicing needlepoint, or working with wood. ? Mix up your normal routine. ? Take a short exercise break. Go for a quick walk or run up and down stairs. ? Spend time in public places where smoking is not allowed.  Focus on doing something kind or helpful for someone else.  Call a friend or family member to talk during a craving.  Join a support group.  Call a quit line, such as 1-800-QUIT-NOW.  Talk with your health care provider about medicines that might help you cope with cravings and make quitting easier for you.  How can I deal with withdrawal symptoms? Your body may experience negative effects as it tries to get used to not having nicotine in the system. These effects are called withdrawal symptoms. They may include:  Feeling hungrier than normal.  Trouble concentrating.  Irritability.  Trouble sleeping.  Feeling depressed.  Restlessness and agitation.  Craving a cigarette.  To manage withdrawal symptoms:  Avoid places, people, and activities that trigger your cravings.  Remember why you want to quit.  Get plenty of sleep.  Avoid coffee and other caffeinated drinks. These may worsen some of your  symptoms.  How can I handle social situations? Social situations can be difficult when you are quitting smoking, especially in the first few weeks. To manage this, you can:  Avoid parties, bars, and other social situations where people might be smoking.  Avoid alcohol.  Leave right away if you have the urge to smoke.  Explain to your family and friends that you are quitting smoking. Ask for understanding and support.  Plan activities with friends or family where smoking is not an option.  What are some ways I can cope with stress? Wanting to smoke may cause stress, and stress can make you want to smoke. Find ways to manage your stress. Relaxation techniques can help. For example:  Breathe slowly and deeply, in through your nose and out through your mouth.  Listen to soothing, relaxing music.  Talk with a family member or friend about your stress.  Light a candle.  Soak in a bath or take a shower.  Think about a peaceful place.  What are some ways I can prevent weight gain? Be aware that many people gain weight after they quit smoking. However, not everyone does. To keep from gaining weight, have a plan in place before you quit and stick to the plan after you quit. Your plan should include:  Having healthy snacks. When you have a craving, it may help to: ? Eat plain popcorn, crunchy carrots, celery, or other cut vegetables. ? Chew sugar-free gum.  Changing how you eat: ? Eat small portion sizes at meals. ?   Eat 4-6 small meals throughout the day instead of 1-2 large meals a day. ? Be mindful when you eat. Do not watch television or do other things that might distract you as you eat.  Exercising regularly: ? Make time to exercise each day. If you do not have time for a long workout, do short bouts of exercise for 5-10 minutes several times a day. ? Do some form of strengthening exercise, like weight lifting, and some form of aerobic exercise, like running or  swimming.  Drinking plenty of water or other low-calorie or no-calorie drinks. Drink 6-8 glasses of water daily, or as much as instructed by your health care provider.  Summary  Quitting smoking is a physical and mental challenge. You will face cravings, withdrawal symptoms, and temptation to smoke again. Preparation can help you as you go through these challenges.  You can cope with cravings by keeping your mouth busy (such as by chewing gum), keeping your body and hands busy, and making calls to family, friends, or a helpline for people who want to quit smoking.  You can cope with withdrawal symptoms by avoiding places where people smoke, avoiding drinks with caffeine, and getting plenty of rest.  Ask your health care provider about the different ways to prevent weight gain, avoid stress, and handle social situations. This information is not intended to replace advice given to you by your health care provider. Make sure you discuss any questions you have with your health care provider. Document Released: 08/14/2016 Document Revised: 08/14/2016 Document Reviewed: 08/14/2016 Elsevier Interactive Patient Education  2018 [place] Risks of Smoking Smoking cigarettes is very bad for your health. Tobacco smoke has over 200 known poisons in it. It contains the poisonous gases nitrogen oxide and carbon monoxide. There are over 60 chemicals in tobacco smoke that cause cancer. Smoking is difficult to quit because a chemical in tobacco, called nicotine, causes addiction or dependence. When you smoke and inhale, nicotine is absorbed rapidly into the bloodstream through your lungs. Both inhaled and non-inhaled nicotine may be addictive. What are the risks of cigarette smoke? Cigarette smokers have an increased risk of many serious medical problems, including:  Lung cancer.  Lung disease, such as pneumonia, bronchitis, and emphysema.  Chest pain (angina) and heart attack because the heart  is not getting enough oxygen.  Heart disease and peripheral blood vessel disease.  High blood pressure (hypertension).  Stroke.  Oral cancer, including cancer of the lip, mouth, or voice box.  Bladder cancer.  Pancreatic cancer.  Cervical cancer.  Pregnancy complications, including premature birth.  Stillbirths and smaller newborn babies, birth defects, and genetic damage to sperm.  Early menopause.  Lower estrogen level for women.  Infertility.  Facial wrinkles.  Blindness.  Increased risk of broken bones (fractures).  Senile dementia.  Stomach ulcers and internal bleeding.  Delayed wound healing and increased risk of complications during surgery.  Even smoking lightly shortens your life expectancy by several years.  Because of secondhand smoke exposure, children of smokers have an increased risk of the following:  Sudden infant death syndrome (SIDS).  Respiratory infections.  Lung cancer.  Heart disease.  Ear infections.  What are the benefits of quitting? There are many health benefits of quitting smoking. Here are some of them:  Within days of quitting smoking, your risk of having a heart attack decreases, your blood flow improves, and your lung capacity improves. Blood pressure, pulse rate, and breathing patterns start returning to normal soon  after quitting.  Within months, your lungs may clear up completely.  Quitting for 10 years reduces your risk of developing lung cancer and heart disease to almost that of a nonsmoker.  People who quit may see an improvement in their overall quality of life.  How do I quit smoking? Smoking is an addiction with both physical and psychological effects, and longtime habits can be hard to change. Your health care provider can recommend:  Programs and community resources, which may include group support, education, or talk therapy.  Prescription medicines to help reduce cravings.  Nicotine replacement  products, such as patches, gum, and nasal sprays. Use these products only as directed. Do not replace cigarette smoking with electronic cigarettes, which are commonly called e-cigarettes. The safety of e-cigarettes is not known, and some may contain harmful chemicals.  A combination of two or more of these methods.  Where to find more information:  American Lung Association: www.lung.org  American Cancer Society: www.cancer.org Summary  Smoking cigarettes is very bad for your health. Cigarette smokers have an increased risk of many serious medical problems, including several cancers, heart disease, and stroke.  Smoking is an addiction with both physical and psychological effects, and longtime habits can be hard to change.  By stopping right away, you can greatly reduce the risk of medical problems for you and your family.  To help you quit smoking, your health care provider can recommend programs, community resources, prescription medicines, and nicotine replacement products such as patches, gum, and nasal sprays. This information is not intended to replace advice given to you by your health care provider. Make sure you discuss any questions you have with your health care provider. Document Released: 09/24/2004 Document Revised: 08/21/2016 Document Reviewed: 08/21/2016 Elsevier Interactive Patient Education  2017 [employer].  Steps to Quit Smoking Smoking tobacco can be bad for your health. It can also affect almost every organ in your body. Smoking puts you and people around you at risk for many serious long-lasting (chronic) diseases. Quitting smoking is hard, but it is one of the best things that you can do for your health. It is never too late to quit. What are the benefits of quitting smoking? When you quit smoking, you lower your risk for getting serious diseases and conditions. They can include:  Lung cancer or lung disease.  Heart disease.  Stroke.  Heart attack.  Not  being able to have children (infertility).  Weak bones (osteoporosis) and broken bones (fractures).  If you have coughing, wheezing, and shortness of breath, those symptoms may get better when you quit. You may also get sick less often. If you are pregnant, quitting smoking can help to lower your chances of having a baby of low birth weight. What can I do to help me quit smoking? Talk with your doctor about what can help you quit smoking. Some things you can do (strategies) include:  Quitting smoking totally, instead of slowly cutting back how much you smoke over a period of time.  Going to in-person counseling. You are more likely to quit if you go to many counseling sessions.  Using resources and support systems, such as: ? Database administrator with a Social worker. ? Phone quitlines. ? Careers information officer. ? Support groups or group counseling. ? Text messaging programs. ? Mobile phone apps or applications.  Taking medicines. Some of these medicines may have nicotine in them. If you are pregnant or breastfeeding, do not take any medicines to quit smoking unless your doctor  says it is okay. Talk with your doctor about counseling or other things that can help you.  Talk with your doctor about using more than one strategy at the same time, such as taking medicines while you are also going to in-person counseling. This can help make quitting easier. What things can I do to make it easier to quit? Quitting smoking might feel very hard at first, but there is a lot that you can do to make it easier. Take these steps:  Talk to your family and friends. Ask them to support and encourage you.  Call phone quitlines, reach out to support groups, or work with a Social worker.  Ask people who smoke to not smoke around you.  Avoid places that make you want (trigger) to smoke, such as: ? Bars. ? Parties. ? Smoke-break areas at work.  Spend time with people who do not smoke.  Lower the stress in your  life. Stress can make you want to smoke. Try these things to help your stress: ? Getting regular exercise. ? Deep-breathing exercises. ? Yoga. ? Meditating. ? Doing a body scan. To do this, close your eyes, focus on one area of your body at a time from head to toe, and notice which parts of your body are tense. Try to relax the muscles in those areas.  Download or buy apps on your mobile phone or tablet that can help you stick to your quit plan. There are many free apps, such as QuitGuide from the [employer] Office manager for Disease Control and Prevention). You can find more support from smokefree.gov and other websites.  This information is not intended to replace advice given to you by your health care provider. Make sure you discuss any questions you have with your health care provider. Document Released: 06/13/2009 Document Revised: 04/14/2016 Document Reviewed: 01/01/2015 Elsevier Interactive Patient Education  2018 [employer]. Varenicline oral tablets What is this medicine? VARENICLINE (var EN i kleen) is used to help people quit smoking. It can reduce the symptoms caused by stopping smoking. It is used with a patient support program recommended by your physician. This medicine may be used for other purposes; ask your health care provider or pharmacist if you have questions. COMMON BRAND NAME(S): Chantix What should I tell my health care provider before I take this medicine? They need to know if you have any of these conditions: -bipolar disorder, depression, schizophrenia or other mental illness -heart disease -if you often drink alcohol -kidney disease -peripheral vascular disease -seizures -stroke -suicidal thoughts, plans, or attempt; a previous suicide attempt by you or a family member -an unusual or allergic reaction to varenicline, other medicines, foods, dyes, or preservatives -pregnant or trying to get pregnant -breast-feeding How should I use this medicine? Take this medicine  by mouth after eating. Take with a full glass of water. Follow the directions on the prescription label. Take your doses at regular intervals. Do not take your medicine more often than directed. There are 3 ways you can use this medicine to help you quit smoking; talk to your health care professional to decide which plan is right for you: 1) you can choose a quit date and start this medicine 1 week before the quit date, or, 2) you can start taking this medicine before you choose a quit date, and then pick a quit date between day 8 and 35 days of treatment, or, 3) if you are not sure that you are able or willing to quit smoking right away,  start taking this medicine and slowly decrease the amount you smoke as directed by your health care professional with the goal of being cigarette-free by week 12 of treatment. Stick to your plan; ask about support groups or other ways to help you remain cigarette-free. If you are motivated to quit smoking and did not succeed during a previous attempt with this medicine for reasons other than side effects, or if you returned to smoking after this treatment, speak with your health care professional about whether another course of this medicine may be right for you. A special MedGuide will be given to you by the pharmacist with each prescription and refill. Be sure to read this information carefully each time. Talk to your pediatrician regarding the use of this medicine in children. This medicine is not approved for use in children. Overdosage: If you think you have taken too much of this medicine contact a poison control center or emergency room at once. NOTE: This medicine is only for you. Do not share this medicine with others. What if I miss a dose? If you miss a dose, take it as soon as you can. If it is almost time for your next dose, take only that dose. Do not take double or extra doses. What may interact with this medicine? -alcohol or any product that contains  alcohol -insulin -other stop smoking aids -theophylline -warfarin This list may not describe all possible interactions. Give your health care provider a list of all the medicines, herbs, non-prescription drugs, or dietary supplements you use. Also tell them if you smoke, drink alcohol, or use illegal drugs. Some items may interact with your medicine. What should I watch for while using this medicine? Visit your doctor or health care professional for regular check ups. Ask for ongoing advice and encouragement from your doctor or healthcare professional, friends, and family to help you quit. If you smoke while on this medication, quit again Your mouth may get dry. Chewing sugarless gum or sucking hard candy, and drinking plenty of water may help. Contact your doctor if the problem does not go away or is severe. You may get drowsy or dizzy. Do not drive, use machinery, or do anything that needs mental alertness until you know how this medicine affects you. Do not stand or sit up quickly, especially if you are an older patient. This reduces the risk of dizzy or fainting spells. Sleepwalking can happen during treatment with this medicine, and can sometimes lead to behavior that is harmful to you, other people, or property. Stop taking this medicine and tell your doctor if you start sleepwalking or have other unusual sleep-related activity. Decrease the amount of alcoholic beverages that you drink during treatment with this medicine until you know if this medicine affects your ability to tolerate alcohol. Some people have experienced increased drunkenness (intoxication), unusual or sometimes aggressive behavior, or no memory of things that have happened (amnesia) during treatment with this medicine. The use of this medicine may increase the chance of suicidal thoughts or actions. Pay special attention to how you are responding while on this medicine. Any worsening of mood, or thoughts of suicide or dying  should be reported to your health care professional right away. What side effects may I notice from receiving this medicine? Side effects that you should report to your doctor or health care professional as soon as possible: -allergic reactions like skin rash, itching or hives, swelling of the face, lips, tongue, or throat -acting aggressive, being angry or  violent, or acting on dangerous impulses -breathing problems -changes in vision -chest pain or chest tightness -confusion, trouble speaking or understanding -new or worsening depression, anxiety, or panic attacks -extreme increase in activity and talking (mania) -fast, irregular heartbeat -feeling faint or lightheaded, falls -fever -pain in legs when walking -problems with balance, talking, walking -redness, blistering, peeling or loosening of the skin, including inside the mouth -ringing in ears -seeing or hearing things that aren't there (hallucinations) -seizures -sleepwalking -sudden numbness or weakness of the face, arm or leg -thoughts about suicide or dying, or attempts to commit suicide -trouble passing urine or change in the amount of urine -unusual bleeding or bruising -unusually weak or tired Side effects that usually do not require medical attention (report to your doctor or health care professional if they continue or are bothersome): -constipation -headache -nausea, vomiting -strange dreams -stomach gas -trouble sleeping This list may not describe all possible side effects. Call your doctor for medical advice about side effects. You may report side effects to FDA at 1-800-FDA-1088. Where should I keep my medicine? Keep out of the reach of children. Store at room temperature between 15 and 30 degrees C (59 and 86 degrees F). Throw away any unused medicine after the expiration date. NOTE: This sheet is a summary. It may not cover all possible information. If you have questions about this medicine, talk to your  doctor, pharmacist, or health care provider.  2018 Elsevier/Gold Standard (2015-05-02 16:14:23)

## 2018-02-16 LAB — CMP14+EGFR
A/G RATIO: 1.5 (ref 1.2–2.2)
ALT: 62 IU/L — ABNORMAL HIGH (ref 0–44)
AST: 63 IU/L — AB (ref 0–40)
Albumin: 4.7 g/dL (ref 3.5–5.5)
Alkaline Phosphatase: 85 IU/L (ref 39–117)
BUN/Creatinine Ratio: 17 (ref 9–20)
BUN: 13 mg/dL (ref 6–24)
Bilirubin Total: 0.6 mg/dL (ref 0.0–1.2)
CALCIUM: 9.7 mg/dL (ref 8.7–10.2)
CO2: 24 mmol/L (ref 20–29)
CREATININE: 0.77 mg/dL (ref 0.76–1.27)
Chloride: 100 mmol/L (ref 96–106)
GFR, EST AFRICAN AMERICAN: 118 mL/min/{1.73_m2} (ref >59)
GFR, EST NON AFRICAN AMERICAN: 102 mL/min/{1.73_m2} (ref >59)
Globulin, Total: 3.1 g/dL (ref 1.5–4.5)
Glucose: 141 mg/dL — ABNORMAL HIGH (ref 65–99)
POTASSIUM: 4.5 mmol/L (ref 3.5–5.2)
Sodium: 139 mmol/L (ref 134–144)
TOTAL PROTEIN: 7.8 g/dL (ref 6.0–8.5)

## 2018-02-22 ENCOUNTER — Ambulatory Visit (HOSPITAL_COMMUNITY)
Admission: RE | Admit: 2018-02-22 | Discharge: 2018-02-22 | Disposition: A | Discharge status: Home / Self Care | Payer: Self-pay | Source: Ambulatory Visit | Attending: Nurse Practitioner | Admitting: Nurse Practitioner

## 2018-02-22 ENCOUNTER — Encounter: Payer: Self-pay | Admitting: Nurse Practitioner

## 2018-02-22 ENCOUNTER — Ambulatory Visit: Payer: Self-pay | Attending: Nurse Practitioner | Admitting: Nurse Practitioner

## 2018-02-22 VITALS — BP 127/75 | HR 89 | Temp 99.3°F | Ht 71.0 in | Wt 172.6 lb

## 2018-02-22 DIAGNOSIS — Z800 Family history of malignant neoplasm of digestive organs: Secondary | ICD-10-CM | POA: Insufficient documentation

## 2018-02-22 DIAGNOSIS — S90821A Blister (nonthermal), right foot, initial encounter: Secondary | ICD-10-CM | POA: Insufficient documentation

## 2018-02-22 DIAGNOSIS — Z8619 Personal history of other infectious and parasitic diseases: Secondary | ICD-10-CM | POA: Insufficient documentation

## 2018-02-22 DIAGNOSIS — L089 Local infection of the skin and subcutaneous tissue, unspecified: Secondary | ICD-10-CM | POA: Insufficient documentation

## 2018-02-22 DIAGNOSIS — X58XXXA Exposure to other specified factors, initial encounter: Secondary | ICD-10-CM | POA: Insufficient documentation

## 2018-02-22 DIAGNOSIS — S90821D Blister (nonthermal), right foot, subsequent encounter: Secondary | ICD-10-CM | POA: Insufficient documentation

## 2018-02-22 DIAGNOSIS — Z811 Family history of alcohol abuse and dependence: Secondary | ICD-10-CM | POA: Insufficient documentation

## 2018-02-22 DIAGNOSIS — Z7982 Long term (current) use of aspirin: Secondary | ICD-10-CM | POA: Insufficient documentation

## 2018-02-22 DIAGNOSIS — Z91030 Bee allergy status: Secondary | ICD-10-CM | POA: Insufficient documentation

## 2018-02-22 DIAGNOSIS — Z823 Family history of stroke: Secondary | ICD-10-CM | POA: Insufficient documentation

## 2018-02-22 DIAGNOSIS — Z7984 Long term (current) use of oral hypoglycemic drugs: Secondary | ICD-10-CM | POA: Insufficient documentation

## 2018-02-22 DIAGNOSIS — E1165 Type 2 diabetes mellitus with hyperglycemia: Secondary | ICD-10-CM

## 2018-02-22 DIAGNOSIS — K219 Gastro-esophageal reflux disease without esophagitis: Secondary | ICD-10-CM | POA: Insufficient documentation

## 2018-02-22 DIAGNOSIS — Z833 Family history of diabetes mellitus: Secondary | ICD-10-CM | POA: Insufficient documentation

## 2018-02-22 DIAGNOSIS — F329 Major depressive disorder, single episode, unspecified: Secondary | ICD-10-CM | POA: Insufficient documentation

## 2018-02-22 DIAGNOSIS — X58XXXD Exposure to other specified factors, subsequent encounter: Secondary | ICD-10-CM | POA: Insufficient documentation

## 2018-02-22 DIAGNOSIS — Z79899 Other long term (current) drug therapy: Secondary | ICD-10-CM | POA: Insufficient documentation

## 2018-02-22 DIAGNOSIS — IMO0002 Reserved for concepts with insufficient information to code with codable children: Secondary | ICD-10-CM

## 2018-02-22 DIAGNOSIS — E1140 Type 2 diabetes mellitus with diabetic neuropathy, unspecified: Secondary | ICD-10-CM | POA: Insufficient documentation

## 2018-02-22 LAB — GLUCOSE, POCT (MANUAL RESULT ENTRY): POC GLUCOSE: 231 mg/dL — AB (ref 70–99)

## 2018-02-22 MED ORDER — SILVER SULFADIAZINE 1 % EX CREA: TOPICAL_CREAM | CUTANEOUS | 1 refills | Status: DC

## 2018-02-22 MED ORDER — SULFAMETHOXAZOLE-TRIMETHOPRIM 800-160 MG PO TABS: 1.0000 | ORAL_TABLET | Freq: Two times a day (BID) | ORAL | 0 refills | Status: AC

## 2018-02-22 MED FILL — SULFAMETHOXAZOLE-TMP DS TAB: 800-160 | 10 days supply | Qty: 20 | Fill #0

## 2018-02-22 MED FILL — SILVADENE 1% CREAM: 1 | 30 days supply | Qty: 85 | Fill #0

## 2018-02-22 NOTE — Patient Instructions (Signed)
Blisters, Adult A blister is a raised bubble of skin filled with liquid. Blisters often develop in an area of the skin that repeatedly rubs or presses against another surface (friction blister). Friction blisters can occur on any part of the body, but they usually develop on the hands or feet. Long-term pressure on the same area of the skin can also lead to areas of hardened skin (calluses). What are the causes? A blister can be caused by:  An injury.  A burn.  An allergic reaction.  An infection.  Exposure to irritating chemicals.  Friction, especially in an area with a lot of heat and moisture.  Friction blisters often result from:  Sports.  Repetitive activities.  Using tools and doing other activities without wearing gloves.  Shoes that are too tight or too loose.  What are the signs or symptoms? A blister is often round and looks like a bump. It may:  Itch.  Be painful to the touch.  Before a blister forms, the skin may:  Become red.  Feel warm.  Itch.  Be painful to the touch.  How is this diagnosed? A blister is diagnosed with a physical exam. How is this treated? Treatment usually involves protecting the area where the blister has formed until the skin has healed. Other treatments may include:  A bandage (dressing) to cover the blister.  Extra padding around and over the blister, so that it does not rub on anything.  Antibiotic ointment.  Most blisters break open, dry up, and go away on their own within 1-2 weeks. Blisters that are very painful may be drained before they break open on their own. If the blister is large or painful, it can be drained by: 1. Sterilizing a small needle with rubbing alcohol. 2. Washing your hands with soap and water. 3. Inserting the needle in the edge of the blister to make a small hole. Some fluid will drain out of the hole. Let the top or roof of the blister stay in place. This helps the skin heal. 4. Washing the  blister with mild soap and water. 5. Covering the blister with antibiotic ointment, if prescribed by your health care provider, and a dressing.  Some blisters may need to be drained by a health care provider. Follow these instructions at home:  Protect the area where the blister has formed as told by your health care provider.  Keep your blister clean and dry. This helps to prevent infection.  Do not pop your blister. This can cause infection.  If you were prescribed an antibiotic, use it as told by your health care provider. Do not stop using the antibiotic even if your condition improves.  Wear different shoes until the blister heals.  Avoid the activity that caused the blister until your blister heals.  Check your blister every day for signs of infection. Check for: ? More redness, swelling, or pain. ? More fluid or blood. ? Warmth. ? Pus or a bad smell. ? The blister getting better and then getting worse. How is this prevented? Taking these steps can help to prevent blisters that are caused by friction. Make sure you:  Wear comfortable shoes that fit well.  Always wear socks with shoes.  Wear extra socks or use tape, bandages, or pads over blister-prone areas as needed. You may also apply petroleum jelly under bandages in blister-prone areas.  Wear protective gear, such as gloves, when participating in sports or activities that can cause blisters.  Wear loose-fitting,   moisture-wicking clothes when participating in sports or activities.  Use powders as needed to keep your feet dry.  Contact a health care provider if:  You have more redness, swelling, or pain around your blister.  You have more fluid or blood coming from your blister.  Your blister feels warm to the touch.  You have pus or a bad smell coming from your blister.  You have a fever or chills.  Your blister gets better and then it gets worse. This information is not intended to replace advice given to  you by your health care provider. Make sure you discuss any questions you have with your health care provider. Document Released: 09/24/2004 Document Revised: 04/15/2016 Document Reviewed: 02/28/2016 Elsevier Interactive Patient Education  2018 [place], Adult A burn is an injury to the skin or the tissues under the skin. There are three types of burns:  First degree. These burns may cause the skin to be red and a bit swollen.  Second degree. These burns are very painful and cause the skin to be very red. The skin may also leak fluid, look shiny, and start to have blisters.  Third degree. These burns cause permanent damage. They turn the skin white or black and make it look charred, dry, and leathery.  Taking care of your burn properly can help to prevent pain and infection. It can also help the burn to heal more quickly. How is this treated? Right after a burn:  Rinse or soak the burn under cool water. Do this for several minutes. Do not put ice on your burn. That can cause more damage.  Lightly cover the burn with a clean (sterile) cloth (dressing). Burn care  Raise (elevate) the injured area above the level of your heart while sitting or lying down.  Follow instructions from your doctor about: ? How to clean and take care of the burn. ? When to change and remove the cloth.  Check your burn every day for signs of infection. Check for: ? More redness, swelling, or pain. ? Warmth. ? Pus or a bad smell. Medicine   Take over-the-counter and prescription medicines only as told by your doctor.  If you were prescribed antibiotic medicine, take or apply it as told by your doctor. Do not stop using the antibiotic even if your condition improves. General instructions  To prevent infection: ? Do not put butter, oil, or other home treatments on the burn. ? Do not scratch or pick at the burn. ? Do not break any blisters. ? Do not peel skin.  Do not rub your burn,  even when you are cleaning it.  Protect your burn from the sun. Contact a doctor if:  Your condition does not get better.  Your condition gets worse.  You have a fever.  Your burn looks different or starts to have black or red spots on it.  Your burn feels warm to the touch.  Your pain is not controlled with medicine. Get help right away if:  You have redness, swelling, or pain at the site of the burn.  You have fluid, blood, or pus coming from your burn.  You have red streaks near the burn.  You have very bad pain. This information is not intended to replace advice given to you by your health care provider. Make sure you discuss any questions you have with your health care provider. Document Released: 05/26/2008 Document Revised: 10/03/2016 Document Reviewed: 02/04/2016 Elsevier Interactive Patient Education  2018 [employer]  
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
## 2018-02-28 ENCOUNTER — Emergency Department (HOSPITAL_COMMUNITY)
Admission: EM | Admit: 2018-02-28 | Discharge: 2018-02-28 | Disposition: A | Discharge status: Home / Self Care | Payer: Self-pay | Attending: Emergency Medicine | Admitting: Emergency Medicine

## 2018-02-28 ENCOUNTER — Other Ambulatory Visit: Payer: Self-pay

## 2018-02-28 ENCOUNTER — Encounter (HOSPITAL_COMMUNITY): Payer: Self-pay

## 2018-02-28 DIAGNOSIS — Z7982 Long term (current) use of aspirin: Secondary | ICD-10-CM | POA: Insufficient documentation

## 2018-02-28 DIAGNOSIS — L03115 Cellulitis of right lower limb: Secondary | ICD-10-CM | POA: Insufficient documentation

## 2018-02-28 DIAGNOSIS — E119 Type 2 diabetes mellitus without complications: Secondary | ICD-10-CM | POA: Insufficient documentation

## 2018-02-28 DIAGNOSIS — X19XXXS Contact with other heat and hot substances, sequela: Secondary | ICD-10-CM | POA: Insufficient documentation

## 2018-02-28 DIAGNOSIS — S91301S Unspecified open wound, right foot, sequela: Secondary | ICD-10-CM | POA: Insufficient documentation

## 2018-02-28 DIAGNOSIS — F17210 Nicotine dependence, cigarettes, uncomplicated: Secondary | ICD-10-CM | POA: Insufficient documentation

## 2018-02-28 DIAGNOSIS — Z79899 Other long term (current) drug therapy: Secondary | ICD-10-CM | POA: Insufficient documentation

## 2018-02-28 DIAGNOSIS — Z7984 Long term (current) use of oral hypoglycemic drugs: Secondary | ICD-10-CM | POA: Insufficient documentation

## 2018-02-28 MED ORDER — IBUPROFEN 800 MG PO TABS
800.0000 mg | ORAL_TABLET | Freq: Once | ORAL | Status: AC
  Administered 2018-02-28: 800 mg via ORAL
  Filled 2018-02-28: qty 1

## 2018-02-28 MED ORDER — LEVOFLOXACIN 500 MG PO TABS: 500.0000 mg | ORAL_TABLET | Freq: Every day | ORAL | 0 refills | Status: DC

## 2018-02-28 MED FILL — levoFLOXacin 500 MG TABS: 500 | 7 days supply | Qty: 7 | Fill #0
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
## 2018-02-28 NOTE — ED Notes (Signed)
ED Provider at bedside. 

## 2018-02-28 NOTE — ED Notes (Signed)
Bed: WTR7 Expected date:  Expected time:  Means of arrival:  Comments: 

## 2018-02-28 NOTE — ED Notes (Signed)
Pt given water 
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
## 2018-02-28 NOTE — Discharge Instructions (Signed)
Take Levaquin for the full 7 days. Keep your left foot wound clean and covered at least for the next week. For pain, you may take Tylenol and/or Ibuprofen/Naproxen. It is also ok for you to use Neosporin or Bacitracin cream to cover the wound.  Follow-up with your PCP in 1-2 weeks for wound re-check. You may want to discuss with her if a referral to a foot or wound specialist may be appropriate for you.

## 2018-03-08 ENCOUNTER — Ambulatory Visit: Payer: Self-pay | Attending: Family Medicine | Admitting: *Deleted

## 2018-03-08 VITALS — BP 120/79 | HR 88 | Wt 170.0 lb

## 2018-03-08 DIAGNOSIS — I10 Essential (primary) hypertension: Secondary | ICD-10-CM | POA: Insufficient documentation

## 2018-03-08 NOTE — Progress Notes (Signed)
[name] [name] arrived to [facility] alert and oriented in good spirits. He  is here for nurse visit to recheck his blood pressure. At the [facility] on 02/15/2018 with PCP, his blood pressure reading was 155/89. He was advised to return for BP check.  Pt denies chest pain, SOB, HA, dizziness, or blurred vision. Verified medication with patient. Pt states medication was taken this morning.  Blood pressure reading 120/79.  Pt aware of f/u appointment on Thursday July 11,2019.

## 2018-03-10 ENCOUNTER — Ambulatory Visit: Payer: Self-pay | Attending: Nurse Practitioner | Admitting: Physician Assistant

## 2018-03-10 VITALS — BP 117/82 | HR 77 | Temp 98.2°F | Resp 18 | Ht 71.0 in | Wt 172.0 lb

## 2018-03-10 DIAGNOSIS — L089 Local infection of the skin and subcutaneous tissue, unspecified: Secondary | ICD-10-CM

## 2018-03-10 DIAGNOSIS — X58XXXA Exposure to other specified factors, initial encounter: Secondary | ICD-10-CM | POA: Insufficient documentation

## 2018-03-10 DIAGNOSIS — K219 Gastro-esophageal reflux disease without esophagitis: Secondary | ICD-10-CM | POA: Insufficient documentation

## 2018-03-10 DIAGNOSIS — Z91030 Bee allergy status: Secondary | ICD-10-CM | POA: Insufficient documentation

## 2018-03-10 DIAGNOSIS — F329 Major depressive disorder, single episode, unspecified: Secondary | ICD-10-CM | POA: Insufficient documentation

## 2018-03-10 DIAGNOSIS — N529 Male erectile dysfunction, unspecified: Secondary | ICD-10-CM | POA: Insufficient documentation

## 2018-03-10 DIAGNOSIS — K2270 Barrett's esophagus without dysplasia: Secondary | ICD-10-CM | POA: Insufficient documentation

## 2018-03-10 DIAGNOSIS — S90821A Blister (nonthermal), right foot, initial encounter: Secondary | ICD-10-CM | POA: Insufficient documentation

## 2018-03-10 DIAGNOSIS — Z09 Encounter for follow-up examination after completed treatment for conditions other than malignant neoplasm: Secondary | ICD-10-CM

## 2018-03-10 DIAGNOSIS — E1165 Type 2 diabetes mellitus with hyperglycemia: Secondary | ICD-10-CM | POA: Insufficient documentation

## 2018-03-10 DIAGNOSIS — Z7984 Long term (current) use of oral hypoglycemic drugs: Secondary | ICD-10-CM | POA: Insufficient documentation

## 2018-03-10 DIAGNOSIS — IMO0001 Reserved for inherently not codable concepts without codable children: Secondary | ICD-10-CM

## 2018-03-10 DIAGNOSIS — Z791 Long term (current) use of non-steroidal anti-inflammatories (NSAID): Secondary | ICD-10-CM | POA: Insufficient documentation

## 2018-03-10 DIAGNOSIS — Z79899 Other long term (current) drug therapy: Secondary | ICD-10-CM | POA: Insufficient documentation

## 2018-03-10 DIAGNOSIS — Z7982 Long term (current) use of aspirin: Secondary | ICD-10-CM | POA: Insufficient documentation

## 2018-03-10 LAB — GLUCOSE, POCT (MANUAL RESULT ENTRY): POC GLUCOSE: 269 mg/dL — AB (ref 70–99)

## 2018-03-10 MED FILL — LOSARTAN POTASSIUM 100 MG T: 100 | 30 days supply | Qty: 30 | Fill #3

## 2018-03-10 MED FILL — OMEPRAZOLE 20 MG CAP: 20 | 30 days supply | Qty: 30 | Fill #1

## 2018-03-10 MED FILL — ATORVASTATIN CALCIUM 40 MG: 40 | 30 days supply | Qty: 30 | Fill #3

## 2018-03-10 MED FILL — !NOVOLOG 100UNITS/ML VIAL: 100/ML | 22 days supply | Qty: 10 | Fill #2

## 2018-03-10 MED FILL — INVOKANA 100 MG TABLET: 100 | 30 days supply | Qty: 30 | Fill #9
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
## 2018-03-11 ENCOUNTER — Encounter (HOSPITAL_BASED_OUTPATIENT_CLINIC_OR_DEPARTMENT_OTHER): Payer: Self-pay | Attending: Internal Medicine

## 2018-03-11 DIAGNOSIS — F17200 Nicotine dependence, unspecified, uncomplicated: Secondary | ICD-10-CM | POA: Insufficient documentation

## 2018-03-11 DIAGNOSIS — L97511 Non-pressure chronic ulcer of other part of right foot limited to breakdown of skin: Secondary | ICD-10-CM | POA: Insufficient documentation

## 2018-03-11 DIAGNOSIS — L03115 Cellulitis of right lower limb: Secondary | ICD-10-CM | POA: Insufficient documentation

## 2018-03-11 DIAGNOSIS — Z7984 Long term (current) use of oral hypoglycemic drugs: Secondary | ICD-10-CM | POA: Insufficient documentation

## 2018-03-11 DIAGNOSIS — I10 Essential (primary) hypertension: Secondary | ICD-10-CM | POA: Insufficient documentation

## 2018-03-11 DIAGNOSIS — E11622 Type 2 diabetes mellitus with other skin ulcer: Secondary | ICD-10-CM | POA: Insufficient documentation

## 2018-03-11 DIAGNOSIS — Z79899 Other long term (current) drug therapy: Secondary | ICD-10-CM | POA: Insufficient documentation

## 2018-03-16 ENCOUNTER — Encounter (HOSPITAL_BASED_OUTPATIENT_CLINIC_OR_DEPARTMENT_OTHER): Payer: Self-pay

## 2018-03-17 ENCOUNTER — Other Ambulatory Visit: Payer: Self-pay

## 2018-03-17 MED ORDER — TETANUS-DIPHTH-ACELL PERTUSSIS 5-2.5-18.5 LF-MCG/0.5 IM SUSP: 0.5000 mL | INTRAMUSCULAR | 0 refills | Status: DC

## 2018-04-18 ENCOUNTER — Ambulatory Visit: Payer: Self-pay | Attending: Nurse Practitioner | Admitting: Nurse Practitioner

## 2018-04-18 ENCOUNTER — Encounter: Payer: Self-pay | Admitting: Nurse Practitioner

## 2018-04-18 ENCOUNTER — Other Ambulatory Visit: Payer: Self-pay

## 2018-04-18 VITALS — BP 168/96 | HR 72 | Temp 98.3°F | Resp 16 | Wt 172.0 lb

## 2018-04-18 DIAGNOSIS — K449 Diaphragmatic hernia without obstruction or gangrene: Secondary | ICD-10-CM | POA: Insufficient documentation

## 2018-04-18 DIAGNOSIS — E1140 Type 2 diabetes mellitus with diabetic neuropathy, unspecified: Secondary | ICD-10-CM | POA: Insufficient documentation

## 2018-04-18 DIAGNOSIS — F329 Major depressive disorder, single episode, unspecified: Secondary | ICD-10-CM | POA: Insufficient documentation

## 2018-04-18 DIAGNOSIS — Z7982 Long term (current) use of aspirin: Secondary | ICD-10-CM | POA: Insufficient documentation

## 2018-04-18 DIAGNOSIS — Z79899 Other long term (current) drug therapy: Secondary | ICD-10-CM | POA: Insufficient documentation

## 2018-04-18 DIAGNOSIS — A084 Viral intestinal infection, unspecified: Secondary | ICD-10-CM | POA: Insufficient documentation

## 2018-04-18 DIAGNOSIS — E782 Mixed hyperlipidemia: Secondary | ICD-10-CM | POA: Insufficient documentation

## 2018-04-18 DIAGNOSIS — B1920 Unspecified viral hepatitis C without hepatic coma: Secondary | ICD-10-CM | POA: Insufficient documentation

## 2018-04-18 DIAGNOSIS — K219 Gastro-esophageal reflux disease without esophagitis: Secondary | ICD-10-CM | POA: Insufficient documentation

## 2018-04-18 DIAGNOSIS — Z7984 Long term (current) use of oral hypoglycemic drugs: Secondary | ICD-10-CM | POA: Insufficient documentation

## 2018-04-18 DIAGNOSIS — K2270 Barrett's esophagus without dysplasia: Secondary | ICD-10-CM | POA: Insufficient documentation

## 2018-04-18 DIAGNOSIS — Z800 Family history of malignant neoplasm of digestive organs: Secondary | ICD-10-CM | POA: Insufficient documentation

## 2018-04-18 LAB — GLUCOSE, POCT (MANUAL RESULT ENTRY): POC Glucose: 214 mg/dl — AB (ref 70–99)

## 2018-04-18 MED ORDER — LOPERAMIDE HCL 2 MG PO TABS: 2.0000 mg | ORAL_TABLET | Freq: Four times a day (QID) | ORAL | 0 refills | Status: DC | PRN

## 2018-04-18 MED ORDER — VARENICLINE TARTRATE 0.5 MG PO TABS: 0.5000 mg | ORAL_TABLET | Freq: Two times a day (BID) | ORAL | 0 refills | Status: DC

## 2018-04-18 MED ORDER — ONDANSETRON HCL 4 MG PO TABS: 4.0000 mg | ORAL_TABLET | Freq: Three times a day (TID) | ORAL | 0 refills | Status: DC | PRN

## 2018-04-18 MED FILL — INVOKANA 100 MG TABLET: 100 | 30 days supply | Qty: 30 | Fill #0

## 2018-04-18 MED FILL — ONDANSETRON HCL 4 MG TABLET: 4 | 10 days supply | Qty: 30 | Fill #0

## 2018-04-18 MED FILL — LOSARTAN POTASSIUM 100 MG T: 100 | 30 days supply | Qty: 30 | Fill #4

## 2018-04-18 MED FILL — ATORVASTATIN CALCIUM 40 MG: 40 | 30 days supply | Qty: 30 | Fill #4

## 2018-04-18 NOTE — Progress Notes (Signed)
Follow - up- Blister on right foot.   N/V: Two emesis episodes this a.m. And a few times on last week.

## 2018-04-18 NOTE — Patient Instructions (Signed)
Viral Gastroenteritis, Adult Viral gastroenteritis is also known as the stomach flu. This condition is caused by certain germs (viruses). These germs can be passed from person to person very easily (are very contagious). This condition can cause sudden watery poop (diarrhea), fever, and throwing up (vomiting). Having watery poop and throwing up can make you feel weak and cause you to get dehydrated. Dehydration can make you tired and thirsty, make you have a dry mouth, and make it so you pee (urinate) less often. Older adults and people with other diseases or a weak defense system (immune system) are at higher risk for dehydration. It is important to replace the fluids that you lose from having watery poop and throwing up. Follow these instructions at home: Follow instructions from your doctor about how to care for yourself at home. Eating and drinking  Follow these instructions as told by your doctor:  Take an oral rehydration solution (ORS). This is a drink that is sold at pharmacies and stores.  Drink clear fluids in small amounts as you are able, such as: ? Water. ? Ice chips. ? Diluted fruit juice. ? Low-calorie sports drinks.  Eat bland, easy-to-digest foods in small amounts as you are able, such as: ? Bananas. ? Applesauce. ? Rice. ? Low-fat (lean) meats. ? Toast. ? Crackers.  Avoid fluids that have a lot of sugar or caffeine in them.  Avoid alcohol.  Avoid spicy or fatty foods.  General instructions  Drink enough fluid to keep your pee (urine) clear or pale yellow.  Wash your hands often. If you cannot use soap and water, use hand sanitizer.  Make sure that all people in your home wash their hands well and often.  Rest at home while you get better.  Take over-the-counter and prescription medicines only as told by your doctor.  Watch your condition for any changes.  Take a warm bath to help with any burning or pain from having watery poop.  Keep all follow-up  visits as told by your doctor. This is important. Contact a doctor if:  You cannot keep fluids down.  Your symptoms get worse.  You have new symptoms.  You feel light-headed or dizzy.  You have muscle cramps. Get help right away if:  You have chest pain.  You feel very weak or you pass out (faint).  You see blood in your throw-up.  Your throw-up looks like coffee grounds.  You have bloody or black poop (stools) or poop that look like tar.  You have a very bad headache, a stiff neck, or both.  You have a rash.  You have very bad pain, cramping, or bloating in your belly (abdomen).  You have trouble breathing.  You are breathing very quickly.  Your heart is beating very quickly.  Your skin feels cold and clammy.  You feel confused.  You have pain when you pee.  You have signs of dehydration, such as: ? Dark pee, hardly any pee, or no pee. ? Cracked lips. ? Dry mouth. ? Sunken eyes. ? Sleepiness. ? Weakness. This information is not intended to replace advice given to you by your health care provider. Make sure you discuss any questions you have with your health care provider. Document Released: 02/03/2008 Document Revised: 03/06/2016 Document Reviewed: 04/23/2015 Elsevier Interactive Patient Education  2017 [employer]  

## 2018-04-18 NOTE — Progress Notes (Signed)
Assessment & Plan:  [name] [name] was seen today for follow-up.  Diagnoses and all orders for this visit:  Viral gastroenteritis -     ondansetron (ZOFRAN) 4 MG tablet; Take 1 tablet (4 mg total) by mouth every 8 (eight) hours as needed for nausea or vomiting. -     CMP14+EGFR -     Glucose (CBG) -     loperamide (IMODIUM A-D) 2 MG tablet; Take 1 tablet (2 mg total) by mouth 4 (four) times daily as needed for diarrhea or loose stools.  Mixed hyperlipidemia -     Lipid panel INSTRUCTIONS: Work on a low fat, heart healthy diet and participate in regular aerobic exercise program by working out at least 150 minutes per week; 5 days a week-30 minutes per day. Avoid red meat, fried foods. junk foods, sodas, sugary drinks, unhealthy snacking, alcohol and smoking.  Drink at least 48oz of water per day and monitor your carbohydrate intake daily.    Hereditary hemochromatosis ([place]) -     Iron, TIBC and Ferritin Panel  Patient has been counseled on age-appropriate routine health concerns for screening and prevention. These are reviewed and up-to-date. Referrals have been placed accordingly. Immunizations are up-to-date or declined.    Subjective:   Chief Complaint  Patient presents with  . Follow-up   HPI [name] [name] 57 y.o. male presents to office today with complaints of gastroenteritis.   Gastroenteritis Patient complains of nonbilious vomiting a few times per day, diarrhea a few times per day and nausea for several days.  There is no report of acholic stools, blood in stool, dark urine, fever, hematemesis, hematuria and melena. Patient's oral intake has been decreased for liquids and decreased for solids.  Patient's urine output has been adequate.  There has been no contact with anyone who has similar symptoms.   Patient denies recent travel history. Patient denies recent ingestion of possible contaminated food, toxic plants, inappropriate medications/poisons.   Hyperlipidemia Patient  presents for follow up to hyperlipidemia.  He is medication compliant taking lipitor '40mg'$  daily. He is diet compliant and denies skin xanthelasma or statin intolerance including myalgias.  Lab Results  Component Value Date   CHOL 176 04/18/2018   Lab Results  Component Value Date   HDL 73 04/18/2018   Lab Results  Component Value Date   LDLCALC 90 04/18/2018   Lab Results  Component Value Date   TRIG 65 04/18/2018   Lab Results  Component Value Date   CHOLHDL 2.4 04/18/2018     Review of Systems  Constitutional: Negative for fever, malaise/fatigue and weight loss.  HENT: Negative.  Negative for nosebleeds.   Eyes: Negative.  Negative for blurred vision, double vision and photophobia.  Respiratory: Negative.  Negative for cough and shortness of breath.   Cardiovascular: Negative.  Negative for chest pain, palpitations and leg swelling.  Gastrointestinal: Positive for diarrhea, nausea and vomiting. Negative for heartburn.  Musculoskeletal: Negative.  Negative for myalgias.  Neurological: Negative.  Negative for dizziness, focal weakness, seizures and headaches.  Psychiatric/Behavioral: Negative.  Negative for suicidal ideas.    Past Medical History:  Diagnosis Date  . Barrett's esophagus dx 2016  . Bronchitis   . Depression   . Diabetes ([place])   . ED (erectile dysfunction)   . GERD (gastroesophageal reflux disease)   . Hepatitis C   . Hiatal hernia 10/2014   3cm  . Neuropathy     Past Surgical History:  Procedure Laterality Date  . APPENDECTOMY    .  COLONOSCOPY N/A 02/16/2014   Procedure: COLONOSCOPY;  Surgeon: Gatha Mayer, MD;  Location: [facility];  Service: Endoscopy;  Laterality: N/A;  . COLONOSCOPY    . TONSILLECTOMY      Family History  Problem Relation Age of Onset  . Cancer Mother        breast  . Diabetes Mother   . Heart disease Maternal Grandfather   . Pancreatic cancer Paternal Grandmother   . Stroke Father   . Alcohol abuse Father   .  Pancreatic cancer Father   . Colon cancer Neg Hx   . Stomach cancer Neg Hx   . Rectal cancer Neg Hx   . Esophageal cancer Neg Hx     Social History Reviewed with no changes to be made today.   Outpatient Medications Prior to Visit  Medication Sig Dispense Refill  . albuterol (VENTOLIN HFA) 108 (90 Base) MCG/ACT inhaler Inhale 2 puffs into the lungs every 6 (six) hours as needed for wheezing or shortness of breath. 18 each 0  . aspirin EC 81 MG tablet Take 1 tablet (81 mg total) by mouth daily. 30 tablet 11  . atorvastatin (LIPITOR) 40 MG tablet Take 1 tablet (40 mg total) by mouth daily. 90 tablet 1  . canagliflozin (INVOKANA) 100 MG TABS tablet Take 1 tablet (100 mg total) by mouth daily before breakfast. 30 tablet 3  . citalopram (CELEXA) 40 MG tablet Take 40 mg by mouth daily.    Marland Kitchen ibuprofen (ADVIL,MOTRIN) 600 MG tablet Take 1 tablet by mouth every 8 (eight) hours as needed.  0  . losartan (COZAAR) 100 MG tablet Take 1 tablet (100 mg total) by mouth daily. 90 tablet 1  . Multiple Vitamins-Minerals (MULTIVITAMIN ADULT) TABS Take 1 tablet by mouth daily. 30 tablet 11  . omeprazole (PRILOSEC) 20 MG capsule Take 1 capsule (20 mg total) by mouth daily. 90 capsule 1  . pregabalin (LYRICA) 100 MG capsule Take 1 capsule (100 mg total) by mouth 2 (two) times daily. 60 capsule 0  . silver sulfADIAZINE (SILVADENE) 1 % cream Apply a small amount to affected area 2 (two) times per day. 85 g 1  . Tdap (BOOSTRIX) 5-2.5-18.5 LF-MCG/0.5 injection Inject 0.5 mLs into the muscle as directed. 0.5 mL 0  . benztropine (COGENTIN) 0.5 MG tablet Take 0.5 mg by mouth daily.    . TRUEPLUS LANCETS 28G MISC 1 each by Does not apply route 3 (three) times daily. 100 each 11   No facility-administered medications prior to visit.     Allergies  Allergen Reactions  . Bee Venom Anaphylaxis       Objective:    BP (!) 168/96 (BP Location: Left Arm, Patient Position: Sitting, Cuff Size: Normal)   Pulse 72   Temp  98.3 F (36.8 C) (Oral)   Resp 16   Wt 172 lb (78 kg)   SpO2 96%   BMI 23.99 kg/m  Wt Readings from Last 3 Encounters:  04/18/18 172 lb (78 kg)  03/10/18 172 lb (78 kg)  03/08/18 170 lb (77.1 kg)    Physical Exam  Constitutional: He is oriented to person, place, and time. He appears well-developed and well-nourished. He is cooperative.  HENT:  Head: Normocephalic and atraumatic.  Eyes: EOM are normal.  Neck: Normal range of motion.  Cardiovascular: Normal rate, regular rhythm, normal heart sounds and intact distal pulses. Exam reveals no gallop and no friction rub.  No murmur heard. Pulmonary/Chest: Effort normal and breath sounds normal. No  tachypnea. No respiratory distress. He has no decreased breath sounds. He has no wheezes. He has no rhonchi. He has no rales. He exhibits no tenderness.  Abdominal: Soft. Normal appearance and bowel sounds are normal. There is no tenderness.  Musculoskeletal: Normal range of motion. He exhibits no edema.  Neurological: He is alert and oriented to person, place, and time. Coordination normal.  Skin: Skin is warm and dry.  Psychiatric: He has a normal mood and affect. His behavior is normal. Judgment and thought content normal.  Nursing note and vitals reviewed.      Patient has been counseled extensively about nutrition and exercise as well as the importance of adherence with medications and regular follow-up. The patient was given clear instructions to go to ER or return to medical center if symptoms don't improve, worsen or new problems develop. The patient verbalized understanding.   Follow-up: Return in about 5 weeks (around 05/23/2018) for DM.   [name], FNP-BC [facility] [place], [place]   04/19/2018, 8:54 PM

## 2018-04-19 LAB — CMP14+EGFR
ALK PHOS: 66 IU/L (ref 39–117)
ALT: 55 IU/L — AB (ref 0–44)
AST: 49 IU/L — AB (ref 0–40)
Albumin/Globulin Ratio: 1.6 (ref 1.2–2.2)
Albumin: 4.6 g/dL (ref 3.5–5.5)
BILIRUBIN TOTAL: 0.6 mg/dL (ref 0.0–1.2)
BUN/Creatinine Ratio: 21 — ABNORMAL HIGH (ref 9–20)
BUN: 14 mg/dL (ref 6–24)
CHLORIDE: 99 mmol/L (ref 96–106)
CO2: 24 mmol/L (ref 20–29)
Calcium: 9.7 mg/dL (ref 8.7–10.2)
Creatinine, Ser: 0.67 mg/dL — ABNORMAL LOW (ref 0.76–1.27)
GFR calc non Af Amer: 108 mL/min/{1.73_m2} (ref >59)
GFR, EST AFRICAN AMERICAN: 125 mL/min/{1.73_m2} (ref >59)
GLUCOSE: 203 mg/dL — AB (ref 65–99)
Globulin, Total: 2.8 g/dL (ref 1.5–4.5)
Potassium: 4.3 mmol/L (ref 3.5–5.2)
Sodium: 138 mmol/L (ref 134–144)
TOTAL PROTEIN: 7.4 g/dL (ref 6.0–8.5)

## 2018-04-19 LAB — IRON,TIBC AND FERRITIN PANEL
FERRITIN: 157 ng/mL (ref 30–400)
Iron Saturation: 52 % (ref 15–55)
Iron: 173 ug/dL — ABNORMAL HIGH (ref 38–169)
Total Iron Binding Capacity: 334 ug/dL (ref 250–450)
UIBC: 161 ug/dL (ref 111–343)

## 2018-04-19 LAB — LIPID PANEL
CHOL/HDL RATIO: 2.4 ratio (ref 0.0–5.0)
CHOLESTEROL TOTAL: 176 mg/dL (ref 100–199)
HDL: 73 mg/dL (ref >39)
LDL Calculated: 90 mg/dL (ref 0–99)
TRIGLYCERIDES: 65 mg/dL (ref 0–149)
VLDL CHOLESTEROL CAL: 13 mg/dL (ref 5–40)

## 2018-04-21 ENCOUNTER — Encounter (HOSPITAL_COMMUNITY): Payer: Self-pay | Admitting: Emergency Medicine

## 2018-04-21 ENCOUNTER — Emergency Department (HOSPITAL_COMMUNITY)
Admission: EM | Admit: 2018-04-21 | Discharge: 2018-04-21 | Disposition: A | Discharge status: Home / Self Care | Payer: Self-pay | Attending: Emergency Medicine | Admitting: Emergency Medicine

## 2018-04-21 DIAGNOSIS — F419 Anxiety disorder, unspecified: Secondary | ICD-10-CM | POA: Insufficient documentation

## 2018-04-21 DIAGNOSIS — Z7984 Long term (current) use of oral hypoglycemic drugs: Secondary | ICD-10-CM | POA: Insufficient documentation

## 2018-04-21 DIAGNOSIS — F17210 Nicotine dependence, cigarettes, uncomplicated: Secondary | ICD-10-CM | POA: Insufficient documentation

## 2018-04-21 DIAGNOSIS — I10 Essential (primary) hypertension: Secondary | ICD-10-CM | POA: Insufficient documentation

## 2018-04-21 DIAGNOSIS — F329 Major depressive disorder, single episode, unspecified: Secondary | ICD-10-CM | POA: Insufficient documentation

## 2018-04-21 DIAGNOSIS — Z79899 Other long term (current) drug therapy: Secondary | ICD-10-CM | POA: Insufficient documentation

## 2018-04-21 DIAGNOSIS — E119 Type 2 diabetes mellitus without complications: Secondary | ICD-10-CM | POA: Insufficient documentation

## 2018-04-21 DIAGNOSIS — Z7982 Long term (current) use of aspirin: Secondary | ICD-10-CM | POA: Insufficient documentation

## 2018-04-21 DIAGNOSIS — F1010 Alcohol abuse, uncomplicated: Secondary | ICD-10-CM | POA: Insufficient documentation

## 2018-04-21 LAB — CBC
HEMATOCRIT: 46.1 % (ref 39.0–52.0)
Hemoglobin: 16.2 g/dL (ref 13.0–17.0)
MCH: 32.7 pg (ref 26.0–34.0)
MCHC: 35.1 g/dL (ref 30.0–36.0)
MCV: 93.1 fL (ref 78.0–100.0)
PLATELETS: 178 10*3/uL (ref 150–400)
RBC: 4.95 MIL/uL (ref 4.22–5.81)
RDW: 12.4 % (ref 11.5–15.5)
WBC: 8.6 10*3/uL (ref 4.0–10.5)

## 2018-04-21 LAB — COMPREHENSIVE METABOLIC PANEL
ALT: 56 U/L — AB (ref 0–44)
AST: 53 U/L — AB (ref 15–41)
Albumin: 4.4 g/dL (ref 3.5–5.0)
Alkaline Phosphatase: 68 U/L (ref 38–126)
Anion gap: 17 — ABNORMAL HIGH (ref 5–15)
BUN: 12 mg/dL (ref 6–20)
CHLORIDE: 93 mmol/L — AB (ref 98–111)
CO2: 21 mmol/L — AB (ref 22–32)
CREATININE: 0.61 mg/dL (ref 0.61–1.24)
Calcium: 8.9 mg/dL (ref 8.9–10.3)
GFR calc non Af Amer: >60 mL/min (ref >60)
Glucose, Bld: 208 mg/dL — ABNORMAL HIGH (ref 70–99)
POTASSIUM: 4.1 mmol/L (ref 3.5–5.1)
SODIUM: 131 mmol/L — AB (ref 135–145)
Total Bilirubin: 0.8 mg/dL (ref 0.3–1.2)
Total Protein: 8.3 g/dL — ABNORMAL HIGH (ref 6.5–8.1)

## 2018-04-21 LAB — ETHANOL: Alcohol, Ethyl (B): 47 mg/dL — ABNORMAL HIGH (ref <10)

## 2018-04-21 LAB — CBG MONITORING, ED: Glucose-Capillary: 280 mg/dL — ABNORMAL HIGH (ref 70–99)

## 2018-04-21 MED ORDER — LORAZEPAM 1 MG PO TABS
1.0000 mg | ORAL_TABLET | Freq: Once | ORAL | Status: AC
  Administered 2018-04-21: 1 mg via ORAL
  Filled 2018-04-21: qty 1

## 2018-04-21 MED ORDER — CHLORDIAZEPOXIDE HCL 25 MG PO CAPS: ORAL_CAPSULE | ORAL | 0 refills | Status: DC

## 2018-04-21 MED FILL — CHLORDIAZEPOXIDE 25 MG CAP: 25 | 3 days supply | Qty: 10 | Fill #0

## 2018-04-21 NOTE — ED Notes (Signed)
Discharge instructions reviewed with patient. Patient verbalizes understanding. VSS.   

## 2018-04-21 NOTE — ED Provider Notes (Signed)
Patient placed in Quick Look pathway, seen and evaluated   Chief Complaint: anxious, mind racing  HPI:   Reports his mind is racing today.  He feels slightly confused.  He does drink alcohol every day.  He is recently been decreasing his alcohol intake.  ROS: No fever or abdominal pain.  No chest pain or shortness of breath.  (one)  Physical Exam:   Gen: No distress  Neuro: Awake and Alert  Skin: Warm    Focused Exam: Anxious appearing.  Mild diaphoresis.  Rapid speech.   Initiation of care has begun. The patient has been counseled on the process, plan, and necessity for staying for the completion/evaluation, and the remainder of the medical screening examination    Jola Schmidt, MD 04/21/18 1100

## 2018-04-21 NOTE — ED Provider Notes (Signed)
[facility] DEPT Provider Note   CSN: [id number] Arrival date & time: 04/21/18  1038     History   Chief Complaint Chief Complaint  Patient presents with  . Altered Mental Status    HPI [name] [name] is a 57 y.o. male.  HPI 58 year old male with a history of alcohol abuse who presents the emergency department feeling like his mind is racing somewhat today.  He feels slightly confused.  He states he has been trying to cut down on his alcohol use.  He is currently going to Alcoholics Anonymous.  He does admit to drinking alcohol last night.  No chest pain or shortness of breath.  Denies abdominal pain.  No nausea or vomiting.  Denies diarrhea.  No recent fever chills.  No neck pain or stiffness.  No weakness of his arms or legs.  Symptoms are mild to moderate in severity.  He has had panic attacks before.  This feels slightly similar.   Past Medical History:  Diagnosis Date  . Barrett's esophagus dx 2016  . Bronchitis   . Depression   . Diabetes ([place])   . ED (erectile dysfunction)   . GERD (gastroesophageal reflux disease)   . Hepatitis C   . Hiatal hernia 10/2014   3cm  . Neuropathy     Patient Active Problem List   Diagnosis Date Noted  . Alcohol abuse with alcohol-induced mood disorder ([place]) 12/17/2017  . Hereditary hemochromatosis ([place]) 11/23/2017  . 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 Essential hypertension 04/02/2017  . Hypogonadism male 08/13/2016  . ETOH abuse 06/22/2016  . Low serum testosterone level 01/08/2016  . Uncontrolled type 2 diabetes mellitus without complication, without long-term current use of insulin ([place]) 01/02/2016  . Erectile dysfunction 09/12/2015  . Hepatic cirrhosis ([place]) 01/10/2015  . Chronic hepatitis C without hepatic coma ([place]) 10/31/2014  . Tobacco use disorder 08/03/2013    Past Surgical History:  Procedure Laterality Date  . APPENDECTOMY    . COLONOSCOPY N/A 02/16/2014   Procedure: COLONOSCOPY;  Surgeon: Gatha Mayer, MD;  Location: [facility];  Service: Endoscopy;  Laterality: N/A;  . COLONOSCOPY    . TONSILLECTOMY          Home Medications    Prior to Admission medications   Medication Sig Start Date End Date Taking? Authorizing Provider  albuterol (VENTOLIN HFA) 108 (90 Base) MCG/ACT inhaler Inhale 2 puffs into the lungs every 6 (six) hours as needed for wheezing or shortness of breath. 04/04/17   Boykin Nearing, MD  aspirin EC 81 MG tablet Take 1 tablet (81 mg total) by mouth daily. 04/02/17   Funches, Adriana Mccallum, MD  atorvastatin (LIPITOR) 40 MG tablet Take 1 tablet (40 mg total) by mouth daily. 11/23/17   [name], NP  benztropine (COGENTIN) 0.5 MG tablet Take 0.5 mg by mouth daily.    [provider]  canagliflozin (INVOKANA) 100 MG TABS tablet Take 1 tablet (100 mg total) by mouth daily before breakfast. 11/23/17   [name], NP  chlordiazePOXIDE (LIBRIUM) 25 MG capsule 50mg  PO TID x 1D, then 25-50mg  PO BID X 1D, then 25-50mg  PO QD X 1D 04/21/18   Jola Schmidt, MD  citalopram (CELEXA) 40 MG tablet Take 40 mg by mouth daily.    [provider]  ibuprofen (ADVIL,MOTRIN) 600 MG tablet Take 1 tablet by mouth every 8 (eight) hours as needed. 07/08/17   [provider]  loperamide (IMODIUM A-D) 2 MG  tablet Take 1 tablet (2 mg total) by mouth 4 (four) times daily as needed for diarrhea or loose stools. 04/18/18   [name], NP  losartan (COZAAR) 100 MG tablet Take 1 tablet (100 mg total) by mouth daily. 11/23/17   [name], NP  Multiple Vitamins-Minerals (MULTIVITAMIN ADULT) TABS Take 1 tablet by mouth daily. 04/02/17   Funches, Adriana Mccallum, MD  omeprazole (PRILOSEC) 20 MG capsule Take 1 capsule (20 mg total) by mouth daily. 11/23/17   [name], NP  ondansetron (ZOFRAN) 4 MG tablet Take 1 tablet (4 mg total) by mouth every 8 (eight) hours as needed for nausea or vomiting. 04/18/18   [name], NP    pregabalin (LYRICA) 100 MG capsule Take 1 capsule (100 mg total) by mouth 2 (two) times daily. 11/23/17   [name], NP  silver sulfADIAZINE (SILVADENE) 1 % cream Apply a small amount to affected area 2 (two) times per day. 02/22/18   [name], NP  TRUEPLUS LANCETS 28G MISC 1 each by Does not apply route 3 (three) times daily. 02/18/16   Boykin Nearing, MD    Family History Family History  Problem Relation Age of Onset  . Cancer Mother        breast  . Diabetes Mother   . Heart disease Maternal Grandfather   . Pancreatic cancer Paternal Grandmother   . Stroke Father   . Alcohol abuse Father   . Pancreatic cancer Father   . Colon cancer Neg Hx   . Stomach cancer Neg Hx   . Rectal cancer Neg Hx   . Esophageal cancer Neg Hx     Social History Social History   Tobacco Use  . Smoking status: Current Every Day Smoker    Packs/day: 0.50    Years: 35.00    Pack years: 17.50    Types: Cigarettes  . Smokeless tobacco: Never Used  Substance Use Topics  . Alcohol use: Yes    Alcohol/week: 27.0 standard drinks    Types: 6 Cans of beer, 21 Standard drinks or equivalent per week    Comment: 6 beers a week   . Drug use: No     Allergies   Bee venom   Review of Systems Review of Systems  All other systems reviewed and are negative.    Physical Exam Updated Vital Signs BP (!) 148/95 (BP Location: Right Arm)   Pulse (!) 107   Temp 98.7 F (37.1 C) (Oral)   Resp 18   Ht 5\' 11"  (1.803 m)   Wt 78 kg   SpO2 98%   BMI 23.99 kg/m   Physical Exam  Constitutional: He is oriented to person, place, and time. He appears well-developed and well-nourished.  Anxious appearing  HENT:  Head: Normocephalic and atraumatic.  Eyes: EOM are normal.  Neck: Normal range of motion.  Cardiovascular: Regular rhythm, normal heart sounds and intact distal pulses.  Tachycardia  Pulmonary/Chest: Effort normal and breath sounds normal. No respiratory distress.  Abdominal:  Soft. He exhibits no distension. There is no tenderness.  Musculoskeletal: Normal range of motion.  Neurological: He is alert and oriented to person, place, and time.  Skin: Skin is warm and dry.  Mild diaphoresis  Psychiatric:  anxious  Nursing note and vitals reviewed.    ED Treatments / Results  Labs (all labs ordered are listed, but only abnormal results are displayed) Labs Reviewed  COMPREHENSIVE METABOLIC PANEL - Abnormal; Notable for the following components:  Result Value   Sodium 131 (*)    Chloride 93 (*)    CO2 21 (*)    Glucose, Bld 208 (*)    Total Protein 8.3 (*)    AST 53 (*)    ALT 56 (*)    Anion gap 17 (*)    All other components within normal limits  ETHANOL - Abnormal; Notable for the following components:   Alcohol, Ethyl (B) 47 (*)    All other components within normal limits  CBG MONITORING, ED - Abnormal; Notable for the following components:   Glucose-Capillary 280 (*)    All other components within normal limits  CBC  CBG MONITORING, ED    EKG EKG Interpretation  Date/Time:  Thursday April 21 2018 11:00:25 EDT Ventricular Rate:  103 PR Interval:    QRS Duration: 79 QT Interval:  344 QTC Calculation: 451 R Axis:   75 Text Interpretation:  Sinus tachycardia Baseline wander in lead(s) I No significant change was found Confirmed by Jola Schmidt [phone]) on 04/21/2018 11:23:34 AM   Radiology No results found.  Procedures Procedures (including critical care time)  Medications Ordered in ED Medications  LORazepam (ATIVAN) tablet 1 mg (1 mg Oral Given 04/21/18 1124)     Initial Impression / Assessment and Plan / ED Course  I have reviewed the triage vital signs and the nursing notes.  Pertinent labs & imaging results that were available during my care of the patient were reviewed by me and considered in my medical decision making (see chart for details).    Patient anxious on arrival.  Given a dose of Ativan.  Patient feels much  better after benzodiazepine here in the emergency department.  Some of symptoms may be mild alcohol withdrawal.  Some of this also could have represented anxiety and a panic attack.  Overall he is well-appearing.  He feels much better after Ativan.  His tachycardia is resolved.  He has no chest pain or shortness of breath or abdominal pain.  No indication for additional work-up at this time.  Outpatient primary care follow-up.  Outpatient resources for alcohol abuse given.  Patient be discharged home on Librium.  Patient understands return to the ER for new or worsening symptoms  Final Clinical Impressions(s) / ED Diagnoses   Final diagnoses:  Anxiety  Alcohol abuse    ED Discharge Orders         Ordered    chlordiazePOXIDE (LIBRIUM) 25 MG capsule     04/21/18 1224           Jola Schmidt, MD 04/21/18 1247

## 2018-04-21 NOTE — ED Triage Notes (Addendum)
Per pt, states he drank some ETOH last night-went to bed around 9 pm-woke up at 0600 and felt weak all over and confused-"doesn't feel right"-states mind is racing-MD aware

## 2018-04-25 ENCOUNTER — Ambulatory Visit (HOSPITAL_COMMUNITY)
Admission: EM | Admit: 2018-04-25 | Discharge: 2018-04-25 | Disposition: A | Discharge status: Home / Self Care | Payer: Self-pay | Attending: Family Medicine | Admitting: Family Medicine

## 2018-04-25 ENCOUNTER — Encounter (HOSPITAL_COMMUNITY): Payer: Self-pay

## 2018-04-25 DIAGNOSIS — M7022 Olecranon bursitis, left elbow: Secondary | ICD-10-CM

## 2018-04-25 DIAGNOSIS — M79602 Pain in left arm: Secondary | ICD-10-CM

## 2018-04-25 NOTE — ED Provider Notes (Signed)
[place]    CSN: [id number] Arrival date & time: 04/25/18  1623     History   Chief Complaint Chief Complaint  Patient presents with  . Arm Pain    Left Arm    HPI [name] [name] is a 57 y.o. male.   HPI Elbow is swollen and discolored He denies any injury He started with a swollen elbow, and brings in a picture if a large olecranon bursitis.  He absolutely denies any injury - although admits he is an alcoholic who drinks "whenever I feel like it" and was in the ER on 04/21/18 for problems related to his alcoholism.  I believe there exists a possibility that he traumatized his elbow does not recall it.   He has very little pain.  He has full ROM.  He has full strength and use if his arm. He is here because he had a prior episode with cellulitis in his ankle, and he is a diabetic, wonders if he needs an antibiotic.  He does take aspirin daily.  Past Medical History:  Diagnosis Date  . Barrett's esophagus dx 2016  . Bronchitis   . Depression   . Diabetes ([place])   . ED (erectile dysfunction)   . GERD (gastroesophageal reflux disease)   . Hepatitis C   . Hiatal hernia 10/2014   3cm  . Neuropathy     Patient Active Problem List   Diagnosis Date Noted  . Alcohol abuse with alcohol-induced mood disorder ([place]) 12/17/2017  . Hereditary hemochromatosis ([place]) 11/23/2017  . 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 Essential hypertension 04/02/2017  . Hypogonadism male 08/13/2016  . ETOH abuse 06/22/2016  . Low serum testosterone level 01/08/2016  . Uncontrolled type 2 diabetes mellitus without complication, without long-term current use of insulin ([place]) 01/02/2016  . Erectile dysfunction 09/12/2015  . Hepatic cirrhosis ([place]) 01/10/2015  . Chronic hepatitis C without hepatic coma ([place]) 10/31/2014  . Tobacco use disorder 08/03/2013    Past Surgical History:  Procedure Laterality Date  . APPENDECTOMY    . COLONOSCOPY N/A 02/16/2014   Procedure: COLONOSCOPY;  Surgeon: Gatha Mayer, MD;  Location: [facility];  Service: Endoscopy;  Laterality: N/A;  . COLONOSCOPY    . TONSILLECTOMY         Home Medications    Prior to Admission medications   Medication Sig Start Date End Date Taking? Authorizing Provider  albuterol (VENTOLIN HFA) 108 (90 Base) MCG/ACT inhaler Inhale 2 puffs into the lungs every 6 (six) hours as needed for wheezing or shortness of breath. 04/04/17   Boykin Nearing, MD  aspirin EC 81 MG tablet Take 1 tablet (81 mg total) by mouth daily. 04/02/17   Funches, Adriana Mccallum, MD  atorvastatin (LIPITOR) 40 MG tablet Take 1 tablet (40 mg total) by mouth daily. 11/23/17   [name], NP  benztropine (COGENTIN) 0.5 MG tablet Take 0.5 mg by mouth daily.    [provider]  canagliflozin (INVOKANA) 100 MG TABS tablet Take 1 tablet (100 mg total) by mouth daily before breakfast. 11/23/17   [name], NP  chlordiazePOXIDE (LIBRIUM) 25 MG capsule 50mg  PO TID x 1D, then 25-50mg  PO BID X 1D, then 25-50mg  PO QD X 1D 04/21/18   Jola Schmidt, MD  citalopram (CELEXA) 40 MG tablet Take 40 mg by mouth daily.    [provider]  ibuprofen (ADVIL,MOTRIN) 600 MG tablet Take 1 tablet by mouth every 8 (eight) hours as needed. 07/08/17  [provider]  loperamide (IMODIUM A-D) 2 MG tablet Take 1 tablet (2 mg total) by mouth 4 (four) times daily as needed for diarrhea or loose stools. 04/18/18   [name], NP  losartan (COZAAR) 100 MG tablet Take 1 tablet (100 mg total) by mouth daily. 11/23/17   [name], NP  Multiple Vitamins-Minerals (MULTIVITAMIN ADULT) TABS Take 1 tablet by mouth daily. 04/02/17   Funches, Adriana Mccallum, MD  omeprazole (PRILOSEC) 20 MG capsule Take 1 capsule (20 mg total) by mouth daily. 11/23/17   [name], NP  ondansetron (ZOFRAN) 4 MG tablet Take 1 tablet (4 mg total) by mouth every 8 (eight) hours as needed for nausea or vomiting. 04/18/18   [name], NP    pregabalin (LYRICA) 100 MG capsule Take 1 capsule (100 mg total) by mouth 2 (two) times daily. 11/23/17   [name], NP  silver sulfADIAZINE (SILVADENE) 1 % cream Apply a small amount to affected area 2 (two) times per day. 02/22/18   [name], NP  TRUEPLUS LANCETS 28G MISC 1 each by Does not apply route 3 (three) times daily. 02/18/16   Boykin Nearing, MD    Family History Family History  Problem Relation Age of Onset  . Cancer Mother        breast  . Diabetes Mother   . Heart disease Maternal Grandfather   . Pancreatic cancer Paternal Grandmother   . Stroke Father   . Alcohol abuse Father   . Pancreatic cancer Father   . Colon cancer Neg Hx   . Stomach cancer Neg Hx   . Rectal cancer Neg Hx   . Esophageal cancer Neg Hx     Social History Social History   Tobacco Use  . Smoking status: Current Every Day Smoker    Packs/day: 0.50    Years: 35.00    Pack years: 17.50    Types: Cigarettes  . Smokeless tobacco: Never Used  Substance Use Topics  . Alcohol use: Yes    Alcohol/week: 27.0 standard drinks    Types: 6 Cans of beer, 21 Standard drinks or equivalent per week    Comment: 6 beers a week   . Drug use: No     Allergies   Bee venom   Review of Systems Review of Systems  Constitutional: Negative for chills and fever.  HENT: Negative for ear pain and sore throat.   Eyes: Negative for pain and visual disturbance.  Respiratory: Negative for cough and shortness of breath.   Cardiovascular: Negative for chest pain and palpitations.  Gastrointestinal: Negative for abdominal pain and vomiting.  Genitourinary: Negative for dysuria and hematuria.  Musculoskeletal: Negative for arthralgias and back pain.  Skin: Positive for color change. Negative for rash.       Swelling and discoloration left elbow  Neurological: Negative for seizures and syncope.  All other systems reviewed and are negative.    Physical Exam Triage Vital Signs ED Triage Vitals   Enc Vitals Group     BP 04/25/18 1654 130/70     Pulse Rate 04/25/18 1654 75     Resp 04/25/18 1654 19     Temp 04/25/18 1654 98 F (36.7 C)     Temp Source 04/25/18 1654 Temporal     SpO2 04/25/18 1654 98 %     Weight --      Height --      Head Circumference --      Peak Flow --  Pain Score 04/25/18 1653 0     Pain Loc --      Pain Edu? --      Excl. in [place]? --    No data found.  Updated Vital Signs BP 130/70 (BP Location: Right Arm)   Pulse 75   Temp 98 F (36.7 C) (Temporal)   Resp 19   SpO2 98%   Visual Acuity Right Eye Distance:   Left Eye Distance:   Bilateral Distance:    Right Eye Near:   Left Eye Near:    Bilateral Near:     Physical Exam  Constitutional: He appears well-developed and well-nourished. No distress.  HENT:  Head: Normocephalic and atraumatic.  Mouth/Throat: Oropharynx is clear and moist.  Eyes: Pupils are equal, round, and reactive to light. Conjunctivae are normal.  Neck: Normal range of motion.  Cardiovascular: Normal rate.  Pulmonary/Chest: Effort normal. No respiratory distress.  Abdominal: Soft. He exhibits no distension.  Musculoskeletal: Normal range of motion. He exhibits no edema.  Neurological: He is alert.  Skin: Skin is warm and dry.  Purple ecchymosis in various stages of resolution from the mid bicep down to the mid forearm, on the medial elbow area.  Mild olecranon bursitis with minimal tenderness.  No bony tenderness over the olecranon process.  No bony tenderness over the medial or lateral epicondyles.  Patient has good flexion extension rotation of the forearm.  Good grip strength.  Psychiatric: He has a normal mood and affect. His behavior is normal.     UC Treatments / Results  Labs (all labs ordered are listed, but only abnormal results are displayed) Labs Reviewed - No data to display  EKG None  Radiology No results found.  Procedures Procedures (including critical care time)  Medications Ordered in  UC Medications - No data to display  Initial Impression / Assessment and Plan / UC Course  I have reviewed the triage vital signs and the nursing notes.  Pertinent labs & imaging results that were available during my care of the patient were reviewed by me and considered in my medical decision making (see chart for details).     *Explained the discoloration that he is seeing is from bruising and not from infection.  There is no warmth, tenderness, or erythema to suggest a bacterial infection.  I believe he has had some occult trauma and bruising.  With full range of motion no tenderness I do not think elbow x-rays are going to be contributory.  I have discussed with him conservative treatment and expect complete resolution Final Clinical Impressions(s) / UC Diagnoses   Final diagnoses:  Left arm pain  Olecranon bursitis, left elbow     Discharge Instructions     Warm compresses Activity as tolerated This will resolve without treatment   ED Prescriptions    None     Controlled Substance Prescriptions [place] Controlled Substance Registry consulted? Not Applicable   Raylene Everts, MD 04/25/18 [phone]

## 2018-04-25 NOTE — ED Triage Notes (Signed)
Pt presents with left arm swelling and inflammation and bruising; not related to any injury.

## 2018-04-25 NOTE — Discharge Instructions (Signed)
Warm compresses Activity as tolerated This will resolve without treatment

## 2018-05-03 ENCOUNTER — Other Ambulatory Visit: Payer: Self-pay | Admitting: Family Medicine

## 2018-05-03 DIAGNOSIS — E1165 Type 2 diabetes mellitus with hyperglycemia: Principal | ICD-10-CM

## 2018-05-03 DIAGNOSIS — E1140 Type 2 diabetes mellitus with diabetic neuropathy, unspecified: Secondary | ICD-10-CM

## 2018-05-03 DIAGNOSIS — IMO0002 Reserved for concepts with insufficient information to code with codable children: Secondary | ICD-10-CM

## 2018-05-03 MED ORDER — PREGABALIN 100 MG PO CAPS: 100.0000 mg | ORAL_CAPSULE | Freq: Two times a day (BID) | ORAL | 1 refills | Status: DC

## 2018-05-25 ENCOUNTER — Encounter: Payer: Self-pay | Admitting: Nurse Practitioner

## 2018-05-25 ENCOUNTER — Ambulatory Visit: Payer: Self-pay | Attending: Nurse Practitioner | Admitting: Nurse Practitioner

## 2018-05-25 ENCOUNTER — Other Ambulatory Visit: Payer: Self-pay | Admitting: Nurse Practitioner

## 2018-05-25 VITALS — BP 113/72 | HR 75 | Temp 98.9°F | Ht 71.0 in | Wt 172.4 lb

## 2018-05-25 DIAGNOSIS — Z833 Family history of diabetes mellitus: Secondary | ICD-10-CM | POA: Insufficient documentation

## 2018-05-25 DIAGNOSIS — E1165 Type 2 diabetes mellitus with hyperglycemia: Secondary | ICD-10-CM | POA: Insufficient documentation

## 2018-05-25 DIAGNOSIS — F410 Panic disorder [episodic paroxysmal anxiety] without agoraphobia: Secondary | ICD-10-CM | POA: Insufficient documentation

## 2018-05-25 DIAGNOSIS — Z7984 Long term (current) use of oral hypoglycemic drugs: Secondary | ICD-10-CM | POA: Insufficient documentation

## 2018-05-25 DIAGNOSIS — Z8249 Family history of ischemic heart disease and other diseases of the circulatory system: Secondary | ICD-10-CM | POA: Insufficient documentation

## 2018-05-25 DIAGNOSIS — E871 Hypo-osmolality and hyponatremia: Secondary | ICD-10-CM | POA: Insufficient documentation

## 2018-05-25 DIAGNOSIS — IMO0001 Reserved for inherently not codable concepts without codable children: Secondary | ICD-10-CM

## 2018-05-25 DIAGNOSIS — Z7982 Long term (current) use of aspirin: Secondary | ICD-10-CM | POA: Insufficient documentation

## 2018-05-25 DIAGNOSIS — K449 Diaphragmatic hernia without obstruction or gangrene: Secondary | ICD-10-CM | POA: Insufficient documentation

## 2018-05-25 DIAGNOSIS — E291 Testicular hypofunction: Secondary | ICD-10-CM | POA: Insufficient documentation

## 2018-05-25 DIAGNOSIS — Z79899 Other long term (current) drug therapy: Secondary | ICD-10-CM | POA: Insufficient documentation

## 2018-05-25 DIAGNOSIS — Z791 Long term (current) use of non-steroidal anti-inflammatories (NSAID): Secondary | ICD-10-CM | POA: Insufficient documentation

## 2018-05-25 DIAGNOSIS — E1140 Type 2 diabetes mellitus with diabetic neuropathy, unspecified: Secondary | ICD-10-CM | POA: Insufficient documentation

## 2018-05-25 DIAGNOSIS — F329 Major depressive disorder, single episode, unspecified: Secondary | ICD-10-CM | POA: Insufficient documentation

## 2018-05-25 DIAGNOSIS — K219 Gastro-esophageal reflux disease without esophagitis: Secondary | ICD-10-CM | POA: Insufficient documentation

## 2018-05-25 DIAGNOSIS — Z09 Encounter for follow-up examination after completed treatment for conditions other than malignant neoplasm: Secondary | ICD-10-CM | POA: Insufficient documentation

## 2018-05-25 DIAGNOSIS — B1920 Unspecified viral hepatitis C without hepatic coma: Secondary | ICD-10-CM | POA: Insufficient documentation

## 2018-05-25 LAB — POCT GLYCOSYLATED HEMOGLOBIN (HGB A1C): Hemoglobin A1C: 7 % — AB (ref 4.0–5.6)

## 2018-05-25 LAB — GLUCOSE, POCT (MANUAL RESULT ENTRY): POC Glucose: 113 mg/dl — AB (ref 70–99)

## 2018-05-25 MED ORDER — PAROXETINE HCL 10 MG PO TABS: ORAL_TABLET | ORAL | 1 refills | Status: DC

## 2018-05-25 MED ORDER — CANAGLIFLOZIN 100 MG PO TABS: 100.0000 mg | ORAL_TABLET | Freq: Every day | ORAL | 6 refills | Status: DC

## 2018-05-25 MED FILL — PARoxetine HCL 10 MG TABS: 10 | 18 days supply | Qty: 30 | Fill #0

## 2018-05-25 MED FILL — INVOKANA 100 MG TABLET: 100 | 30 days supply | Qty: 30 | Fill #0

## 2018-05-25 NOTE — Progress Notes (Signed)
Assessment & Plan:  [name] [name] was seen today for follow-up.  Diagnoses and all orders for this visit:  Uncontrolled type 2 diabetes mellitus without complication, without long-term current use of insulin (HCC) -     Glucose (CBG) -     HgB A1c -     canagliflozin (INVOKANA) 100 MG TABS tablet; Take 1 tablet (100 mg total) by mouth daily before breakfast. Continue blood sugar control as discussed in office today, low carbohydrate diet, and regular physical exercise as tolerated, 150 minutes per week (30 min each day, 5 days per week, or 50 min 3 days per week). Keep blood sugar logs with fasting goal of 90-130 mg/dl, post prandial (after you eat) less than 180.  For Hypoglycemia: BS <60 and Hyperglycemia BS >400; contact the clinic ASAP. Annual eye exams and foot exams are recommended.   Hyponatremia -     CMP14+EGFR  Testosterone deficiency in male -     Testosterone He has a history of ED and low testosterone. Requesting to be tested today.   Panic attacks -     PARoxetine (PAXIL) 10 MG tablet; Take 10 mg once daily for 3 to 7 days; may increase dose based on response after 2 weeks to 68m daily.    Patient has been counseled on age-appropriate routine health concerns for screening and prevention. These are reviewed and up-to-date. Referrals have been placed accordingly. Immunizations are up-to-date or declined.    Subjective:   Chief Complaint  Patient presents with  . Follow-up    Pt. is here to follow-up on diabetes.    HPI [name] [age]ANDEL89y.o. male presents to office today for follow up to DM. He is still struggling with sobriety. Attending AA group but does not feel it is helpful and sponsors are not reliable. I have recommended that he locate a different local AA group for better support.    Diabetes Mellitus Type 2 He is not checking his blood glucose levels at home. Denies any hypoglycemic or hyperglycemic symptoms. A1c is stable. He is overdue for eye exam.  Patient has been advised to apply for financial assistance and schedule to see our financial counselor.  Lab Results  Component Value Date   HGBA1C 7.0 (A) 02/15/2018   Lab Results  Component Value Date   HGBA1C 7.0 (A) 05/25/2018   Anxiety: Patient complains of anxiety disorder and panic attacks.  He has the following symptoms: difficulty concentrating, feelings of losing control, palpitations, racing thoughts, shortness of breath. Onset of symptoms was approximately several years ago, unchanged since that time. He denies current suicidal and homicidal ideation. Family history significant for alcoholism.Possible organic causes contributing are: none. Risk factors: positive family history in  father Previous treatment includes ambien for insomnia GAD 7 : Generalized Anxiety Score 05/25/2018 04/18/2018 03/10/2018 02/15/2018  Nervous, Anxious, on Edge 0 1 0 0  Control/stop worrying 1 0 0 0  Worry too much - different things 1 0 0 0  Trouble relaxing 2 2 0 1  Restless 1 1 0 0  Easily annoyed or irritable 1 1 0 0  Afraid - awful might happen 1 0 0 0  Total GAD 7 Score 7 5 0 1   Depression screen PHQ 2/9 05/25/2018  Decreased Interest 2  Down, Depressed, Hopeless 2  PHQ - 2 Score 4  Altered sleeping 2  Tired, decreased energy 2  Change in appetite -  Feeling bad or failure about yourself  3  Trouble concentrating  0  Moving slowly or fidgety/restless 1  Suicidal thoughts 2  PHQ-9 Score 14  Some recent data might be hidden     aReview of Systems  Constitutional: Negative for fever, malaise/fatigue and weight loss.  HENT: Negative.  Negative for nosebleeds.   Eyes: Negative.  Negative for blurred vision, double vision and photophobia.  Respiratory: Negative.  Negative for cough and shortness of breath.   Cardiovascular: Negative.  Negative for chest pain, palpitations and leg swelling.  Gastrointestinal: Positive for heartburn. Negative for nausea and vomiting.  Musculoskeletal:  Negative.  Negative for myalgias.  Neurological: Positive for tingling (peripheral neuropathy). Negative for dizziness, focal weakness, seizures and headaches.  Psychiatric/Behavioral: Positive for depression, substance abuse (ETOH abuse) and suicidal ideas (ruminating; none currently). The patient is nervous/anxious and has insomnia.     Past Medical History:  Diagnosis Date  . Barrett's esophagus dx 2016  . Bronchitis   . Depression   . Diabetes ([place])   . ED (erectile dysfunction)   . GERD (gastroesophageal reflux disease)   . Hepatitis C   . Hiatal hernia 10/2014   3cm  . Neuropathy     Past Surgical History:  Procedure Laterality Date  . APPENDECTOMY    . COLONOSCOPY N/A 02/16/2014   Procedure: COLONOSCOPY;  Surgeon: Gatha Mayer, MD;  Location: [facility];  Service: Endoscopy;  Laterality: N/A;  . COLONOSCOPY    . TONSILLECTOMY      Family History  Problem Relation Age of Onset  . Cancer Mother        breast  . Diabetes Mother   . Heart disease Maternal Grandfather   . Pancreatic cancer Paternal Grandmother   . Stroke Father   . Alcohol abuse Father   . Pancreatic cancer Father   . Colon cancer Neg Hx   . Stomach cancer Neg Hx   . Rectal cancer Neg Hx   . Esophageal cancer Neg Hx     Social History Reviewed with no changes to be made today.   Outpatient Medications Prior to Visit  Medication Sig Dispense Refill  . albuterol (VENTOLIN HFA) 108 (90 Base) MCG/ACT inhaler Inhale 2 puffs into the lungs every 6 (six) hours as needed for wheezing or shortness of breath. 18 each 0  . aspirin EC 81 MG tablet Take 1 tablet (81 mg total) by mouth daily. 30 tablet 11  . atorvastatin (LIPITOR) 40 MG tablet Take 1 tablet (40 mg total) by mouth daily. 90 tablet 1  . benztropine (COGENTIN) 0.5 MG tablet Take 0.5 mg by mouth daily.    . chlordiazePOXIDE (LIBRIUM) 25 MG capsule 93m PO TID x 1D, then 25-564mPO BID X 1D, then 25-5021mO QD X 1D 10 capsule 0  . ibuprofen  (ADVIL,MOTRIN) 600 MG tablet Take 1 tablet by mouth every 8 (eight) hours as needed.  0  . loperamide (IMODIUM A-D) 2 MG tablet Take 1 tablet (2 mg total) by mouth 4 (four) times daily as needed for diarrhea or loose stools. 60 tablet 0  . losartan (COZAAR) 100 MG tablet Take 1 tablet (100 mg total) by mouth daily. 90 tablet 1  . Multiple Vitamins-Minerals (MULTIVITAMIN ADULT) TABS Take 1 tablet by mouth daily. 30 tablet 11  . omeprazole (PRILOSEC) 20 MG capsule Take 1 capsule (20 mg total) by mouth daily. 90 capsule 1  . ondansetron (ZOFRAN) 4 MG tablet Take 1 tablet (4 mg total) by mouth every 8 (eight) hours as needed for nausea or vomiting. 30[place]  tablet 0  . pregabalin (LYRICA) 100 MG capsule Take 1 capsule (100 mg total) by mouth 2 (two) times daily. 180 capsule 1  . canagliflozin (INVOKANA) 100 MG TABS tablet Take 1 tablet (100 mg total) by mouth daily before breakfast. 30 tablet 3  . citalopram (CELEXA) 40 MG tablet Take 40 mg by mouth daily.    . TRUEPLUS LANCETS 28G MISC 1 each by Does not apply route 3 (three) times daily. (Patient not taking: Reported on 05/25/2018) 100 each 11  . silver sulfADIAZINE (SILVADENE) 1 % cream Apply a small amount to affected area 2 (two) times per day. (Patient not taking: Reported on 05/25/2018) 85 g 1   No facility-administered medications prior to visit.     Allergies  Allergen Reactions  . Bee Venom Anaphylaxis       Objective:    BP 113/72 (BP Location: Left Arm, Patient Position: Sitting, Cuff Size: Normal)   Pulse 75   Temp 98.9 F (37.2 C) (Oral)   Ht '5\' 11"'  (1.803 m)   Wt 172 lb 6.4 oz (78.2 kg)   SpO2 95%   BMI 24.04 kg/m  Wt Readings from Last 3 Encounters:  05/25/18 172 lb 6.4 oz (78.2 kg)  04/21/18 172 lb (78 kg)  04/18/18 172 lb (78 kg)    Physical Exam  Constitutional: He is oriented to person, place, and time. He appears well-developed and well-nourished. He is cooperative.  HENT:  Head: Normocephalic and atraumatic.  Eyes:  EOM are normal.  Neck: Normal range of motion.  Cardiovascular: Normal rate, regular rhythm and normal heart sounds. Exam reveals no gallop and no friction rub.  No murmur heard. Pulmonary/Chest: Effort normal and breath sounds normal. No tachypnea. No respiratory distress. He has no decreased breath sounds. He has no wheezes. He has no rhonchi. He has no rales. He exhibits no tenderness.  Abdominal: Bowel sounds are normal.  Musculoskeletal: Normal range of motion. He exhibits no edema.  Neurological: He is alert and oriented to person, place, and time. Coordination normal.  Skin: Skin is warm and dry.  Psychiatric: His speech is normal and behavior is normal. Judgment and thought content normal. Cognition and memory are normal. He exhibits a depressed mood. He expresses no homicidal and no suicidal ideation. He expresses no suicidal plans and no homicidal plans.  Nursing note and vitals reviewed.      Patient has been counseled extensively about nutrition and exercise as well as the importance of adherence with medications and regular follow-up. The patient was given clear instructions to go to ER or return to medical center if symptoms don't improve, worsen or new problems develop. The patient verbalized understanding.   Follow-up: Return in about 26 days (around 06/20/2018) for Panic attacks.   [name], FNP-BC [facility] [place], [place]   05/27/2018, 10:13 AM

## 2018-05-26 LAB — CMP14+EGFR
ALBUMIN: 4.6 g/dL (ref 3.5–5.5)
ALT: 97 IU/L — ABNORMAL HIGH (ref 0–44)
AST: 99 IU/L — ABNORMAL HIGH (ref 0–40)
Albumin/Globulin Ratio: 1.6 (ref 1.2–2.2)
Alkaline Phosphatase: 70 IU/L (ref 39–117)
BUN / CREAT RATIO: 12 (ref 9–20)
BUN: 8 mg/dL (ref 6–24)
Bilirubin Total: 0.2 mg/dL (ref 0.0–1.2)
CALCIUM: 9.2 mg/dL (ref 8.7–10.2)
CO2: 21 mmol/L (ref 20–29)
CREATININE: 0.65 mg/dL — AB (ref 0.76–1.27)
Chloride: 100 mmol/L (ref 96–106)
GFR calc Af Amer: 127 mL/min/{1.73_m2} (ref >59)
GFR, EST NON AFRICAN AMERICAN: 110 mL/min/{1.73_m2} (ref >59)
GLOBULIN, TOTAL: 2.9 g/dL (ref 1.5–4.5)
Glucose: 96 mg/dL (ref 65–99)
Potassium: 4.7 mmol/L (ref 3.5–5.2)
Sodium: 140 mmol/L (ref 134–144)
TOTAL PROTEIN: 7.5 g/dL (ref 6.0–8.5)

## 2018-05-26 LAB — TESTOSTERONE: Testosterone: 319 ng/dL (ref 264–916)

## 2018-05-27 ENCOUNTER — Encounter: Payer: Self-pay | Admitting: Nurse Practitioner

## 2018-05-30 ENCOUNTER — Other Ambulatory Visit: Payer: Self-pay | Admitting: Nurse Practitioner

## 2018-05-30 DIAGNOSIS — A084 Viral intestinal infection, unspecified: Secondary | ICD-10-CM

## 2018-05-30 MED FILL — ATORVASTATIN CALCIUM 40 MG: 40 | 30 days supply | Qty: 30 | Fill #5

## 2018-05-30 MED FILL — LOSARTAN POTASSIUM 100 MG T: 100 | 30 days supply | Qty: 30 | Fill #5

## 2018-06-20 ENCOUNTER — Encounter: Payer: Self-pay | Admitting: Nurse Practitioner

## 2018-06-20 ENCOUNTER — Telehealth: Payer: Self-pay

## 2018-06-20 ENCOUNTER — Ambulatory Visit: Payer: Self-pay | Attending: Nurse Practitioner | Admitting: Nurse Practitioner

## 2018-06-20 VITALS — BP 138/82 | HR 80 | Temp 98.6°F | Ht 71.0 in | Wt 171.0 lb

## 2018-06-20 DIAGNOSIS — Z79899 Other long term (current) drug therapy: Secondary | ICD-10-CM | POA: Insufficient documentation

## 2018-06-20 DIAGNOSIS — IMO0002 Reserved for concepts with insufficient information to code with codable children: Secondary | ICD-10-CM

## 2018-06-20 DIAGNOSIS — E1165 Type 2 diabetes mellitus with hyperglycemia: Secondary | ICD-10-CM | POA: Insufficient documentation

## 2018-06-20 DIAGNOSIS — Z7982 Long term (current) use of aspirin: Secondary | ICD-10-CM | POA: Insufficient documentation

## 2018-06-20 DIAGNOSIS — B1920 Unspecified viral hepatitis C without hepatic coma: Secondary | ICD-10-CM | POA: Insufficient documentation

## 2018-06-20 DIAGNOSIS — Z9114 Patient's other noncompliance with medication regimen: Secondary | ICD-10-CM | POA: Insufficient documentation

## 2018-06-20 DIAGNOSIS — I10 Essential (primary) hypertension: Secondary | ICD-10-CM | POA: Insufficient documentation

## 2018-06-20 DIAGNOSIS — K449 Diaphragmatic hernia without obstruction or gangrene: Secondary | ICD-10-CM | POA: Insufficient documentation

## 2018-06-20 DIAGNOSIS — E782 Mixed hyperlipidemia: Secondary | ICD-10-CM | POA: Insufficient documentation

## 2018-06-20 DIAGNOSIS — IMO0001 Reserved for inherently not codable concepts without codable children: Secondary | ICD-10-CM

## 2018-06-20 DIAGNOSIS — E1140 Type 2 diabetes mellitus with diabetic neuropathy, unspecified: Secondary | ICD-10-CM | POA: Insufficient documentation

## 2018-06-20 DIAGNOSIS — F1020 Alcohol dependence, uncomplicated: Secondary | ICD-10-CM | POA: Insufficient documentation

## 2018-06-20 DIAGNOSIS — F410 Panic disorder [episodic paroxysmal anxiety] without agoraphobia: Secondary | ICD-10-CM | POA: Insufficient documentation

## 2018-06-20 DIAGNOSIS — F329 Major depressive disorder, single episode, unspecified: Secondary | ICD-10-CM | POA: Insufficient documentation

## 2018-06-20 DIAGNOSIS — K219 Gastro-esophageal reflux disease without esophagitis: Secondary | ICD-10-CM | POA: Insufficient documentation

## 2018-06-20 DIAGNOSIS — Z7984 Long term (current) use of oral hypoglycemic drugs: Secondary | ICD-10-CM | POA: Insufficient documentation

## 2018-06-20 LAB — GLUCOSE, POCT (MANUAL RESULT ENTRY): POC GLUCOSE: 236 mg/dL — AB (ref 70–99)

## 2018-06-20 MED ORDER — PREGABALIN 100 MG PO CAPS: 100.0000 mg | ORAL_CAPSULE | Freq: Two times a day (BID) | ORAL | 1 refills | Status: DC

## 2018-06-20 MED ORDER — CHLORDIAZEPOXIDE HCL 25 MG PO CAPS: ORAL_CAPSULE | ORAL | 0 refills | Status: DC

## 2018-06-20 MED ORDER — PAROXETINE HCL 10 MG PO TABS: ORAL_TABLET | ORAL | 1 refills | Status: DC

## 2018-06-20 MED ORDER — CANAGLIFLOZIN 100 MG PO TABS: 100.0000 mg | ORAL_TABLET | Freq: Every day | ORAL | 6 refills | Status: AC

## 2018-06-20 MED ORDER — ATORVASTATIN CALCIUM 40 MG PO TABS: 40.0000 mg | ORAL_TABLET | Freq: Every day | ORAL | 1 refills | Status: DC

## 2018-06-20 MED ORDER — PAROXETINE HCL 10 MG PO TABS: 10.0000 mg | ORAL_TABLET | Freq: Every day | ORAL | 1 refills | Status: DC

## 2018-06-20 MED ORDER — OMEPRAZOLE 40 MG PO CPDR: 40.0000 mg | DELAYED_RELEASE_CAPSULE | Freq: Every day | ORAL | 1 refills | Status: DC

## 2018-06-20 MED ORDER — LOSARTAN POTASSIUM 100 MG PO TABS: 100.0000 mg | ORAL_TABLET | Freq: Every day | ORAL | 1 refills | Status: DC

## 2018-06-20 MED FILL — ATORVASTATIN 40 MG TABLET: 40 | 30 days supply | Qty: 30 | Fill #0

## 2018-06-20 MED FILL — OMEPRAZOLE DR 40 MG CAPSULE: 40 | 30 days supply | Qty: 30 | Fill #0

## 2018-06-20 MED FILL — LOSARTAN POTASSIUM 100 MG T: 100 | 30 days supply | Qty: 30 | Fill #0

## 2018-06-20 MED FILL — INVOKANA 100 MG TABLET: 100 | 30 days supply | Qty: 30 | Fill #0

## 2018-06-20 MED FILL — PARoxetine HCL 10 MG TABS: 10 | 30 days supply | Qty: 30 | Fill #0

## 2018-06-20 MED FILL — ONDANSETRON HCL 4 MG TABLET: 4 | 10 days supply | Qty: 30 | Fill #0

## 2018-06-20 NOTE — Progress Notes (Signed)
Assessment & Plan:  [name] [name] was seen today for follow-up.  Diagnoses and all orders for this visit:  Panic attacks -     PARoxetine (PAXIL) 10 MG tablet; Take 1 tablet (10 mg total) by mouth daily. Controlled at this time.   Alcohol dependence, episodic drinking behavior (HCC) -     chlordiazePOXIDE (LIBRIUM) 25 MG capsule; 50mg  PO TID x 1D, then 25-50mg  PO BID X 1D, then 25-50mg  PO QD X 1 for DTs [name] [name] states he attends AA meetings and also has a sponsor however he did not call his sponsor during his last binge. I have recommended he reach out to his sponsor whenever he feels he wants to drink alcohol. I have also suggested distraction techniques such as volunteering in the community.  Uncontrolled type 2 diabetes mellitus without complication, without long-term current use of insulin (HCC) -     Glucose (CBG) -     canagliflozin (INVOKANA) 100 MG TABS tablet; Take 1 tablet (100 mg total) by mouth daily before breakfast.  Uncontrolled diabetes mellitus with diabetic neuropathy (HCC) -     pregabalin (LYRICA) 100 MG capsule; Take 1 capsule (100 mg total) by mouth 2 (two) times daily.  Gastroesophageal reflux disease, esophagitis presence not specified -     omeprazole (PRILOSEC) 40 MG capsule; Take 1 capsule (40 mg total) by mouth daily INSTRUCTIONS: Avoid GERD Triggers: acidic, spicy or fried foods, caffeine, coffee, sodas,  alcohol and chocolate.  Essential hypertension -     losartan (COZAAR) 100 MG tablet; Take 1 tablet (100 mg total) by mouth daily. Stable and well controlled.  Continue all antihypertensives as prescribed.  Remember to bring in your blood pressure log with you for your follow up appointment.  DASH/Mediterranean Diets are healthier choices for HTN.   Mixed hyperlipidemia -     atorvastatin (LIPITOR) 40 MG tablet; Take 1 tablet (40 mg total) by mouth daily. Stable.  INSTRUCTIONS: Work on a low fat, heart healthy diet and participate in regular aerobic  exercise program by working out at least 150 minutes per week; 5 days a week-30 minutes per day. Avoid red meat, fried foods. junk foods, sodas, sugary drinks, unhealthy snacking, alcohol and smoking.  Drink at least 48oz of water per day and monitor your carbohydrate intake daily.      Patient has been counseled on age-appropriate routine health concerns for screening and prevention. These are reviewed and up-to-date. Referrals have been placed accordingly. Immunizations are up-to-date or declined.    Subjective:   Chief Complaint  Patient presents with  . Follow-up    Pt. stated he still get the panic attack but it has not been bad.    HPI [name] [name] 57 y.o. male presents to office today for follow up to panic attacks.    Anxiety and Depression He was prescribed paxil 10mg  at his last office visit with me several weeks ago. At this time he would like to remain on his current dosage.  Today he endorses some improvement in his mood however he is not taking his paxil every day as he reportedly went on a drinking binge for his birthday weekend and did not take any of his medications. He notes severe DT symptoms for several days however he did not go to the hospital to be treated.  Due to his increased drinking behaviors and history of barretts esophagus I have increased his PPI.  Depression screen [facility] 2/9 06/20/2018 05/25/2018 04/18/2018 03/10/2018 02/15/2018  Decreased Interest 1  2 1 1 1   Down, Depressed, Hopeless 1 2 2 1 1   PHQ - 2 Score 2 4 3 2 2   Altered sleeping 1 2 2 1  0  Tired, decreased energy 2 2 2 1 1   Change in appetite 1 - 2 1 2   Feeling bad or failure about yourself  1 3 1  0 1  Trouble concentrating 0 0 1 0 0  Moving slowly or fidgety/restless 1 1 0 0 0  Suicidal thoughts 0 2 0 0 0  PHQ-9 Score 8 14 11 5 6   Some recent data might be hidden   GAD 7 : Generalized Anxiety Score 06/20/2018 05/25/2018 04/18/2018 03/10/2018  Nervous, Anxious, on Edge 1 0 1 0  Control/stop  worrying 1 1 0 0  Worry too much - different things 1 1 0 0  Trouble relaxing 1 2 2  0  Restless 1 1 1  0  Easily annoyed or irritable 1 1 1  0  Afraid - awful might happen 1 1 0 0  Total GAD 7 Score 7 7 5  0     CHRONIC HYPERTENSION Disease Monitoring  Blood pressure range BP Readings from Last 3 Encounters:  06/20/18 138/82  05/25/18 113/72  04/25/18 130/70  Blood pressure well controlled despite his endorsement of recent medication noncompliance.   Chest pain: no   Dyspnea: no   Claudication: no  Medication compliance: no  Medication Side Effects  Lightheadedness: no   Urinary frequency: no   Edema: no   Impotence: yes  Preventitive Healthcare:  Exercise: yes   Diet Pattern: diet: general  Salt Restriction:  No    Review of Systems  Constitutional: Negative for fever, malaise/fatigue and weight loss.  HENT: Negative.  Negative for nosebleeds.   Eyes: Negative.  Negative for blurred vision, double vision and photophobia.  Respiratory: Negative.  Negative for cough and shortness of breath.   Cardiovascular: Negative.  Negative for chest pain, palpitations and leg swelling.  Gastrointestinal: Positive for heartburn. Negative for nausea and vomiting.  Genitourinary:       ED  Musculoskeletal: Negative.  Negative for myalgias.  Neurological: Negative for dizziness, focal weakness, seizures and headaches.  Psychiatric/Behavioral: Positive for depression and substance abuse. Negative for suicidal ideas. The patient is nervous/anxious.     Past Medical History:  Diagnosis Date  . Barrett's esophagus dx 2016  . Bronchitis   . Depression   . Diabetes ([place])   . ED (erectile dysfunction)   . GERD (gastroesophageal reflux disease)   . Hepatitis C   . Hiatal hernia 10/2014   3cm  . Neuropathy     Past Surgical History:  Procedure Laterality Date  . APPENDECTOMY    . COLONOSCOPY N/A 02/16/2014   Procedure: COLONOSCOPY;  Surgeon: Gatha Mayer, MD;  Location: [facility]  ENDOSCOPY;  Service: Endoscopy;  Laterality: N/A;  . COLONOSCOPY    . TONSILLECTOMY      Family History  Problem Relation Age of Onset  . Cancer Mother        breast  . Diabetes Mother   . Heart disease Maternal Grandfather   . Pancreatic cancer Paternal Grandmother   . Stroke Father   . Alcohol abuse Father   . Pancreatic cancer Father   . Colon cancer Neg Hx   . Stomach cancer Neg Hx   . Rectal cancer Neg Hx   . Esophageal cancer Neg Hx     Social History Reviewed with no changes to be made today.  Outpatient Medications Prior to Visit  Medication Sig Dispense Refill  . albuterol (VENTOLIN HFA) 108 (90 Base) MCG/ACT inhaler Inhale 2 puffs into the lungs every 6 (six) hours as needed for wheezing or shortness of breath. 18 each 0  . aspirin EC 81 MG tablet Take 1 tablet (81 mg total) by mouth daily. 30 tablet 11  . benztropine (COGENTIN) 0.5 MG tablet Take 0.5 mg by mouth daily.    . Multiple Vitamins-Minerals (MULTIVITAMIN ADULT) TABS Take 1 tablet by mouth daily. 30 tablet 11  . ondansetron (ZOFRAN) 4 MG tablet TAKE 1 TABLET BY MOUTH EVERY 8 HOURS AS NEEDED FOR NAUSEA OR VOMITING. 30 tablet 0  . atorvastatin (LIPITOR) 40 MG tablet Take 1 tablet (40 mg total) by mouth daily. 90 tablet 1  . canagliflozin (INVOKANA) 100 MG TABS tablet Take 1 tablet (100 mg total) by mouth daily before breakfast. 30 tablet 6  . ibuprofen (ADVIL,MOTRIN) 600 MG tablet Take 1 tablet by mouth every 8 (eight) hours as needed.  0  . losartan (COZAAR) 100 MG tablet Take 1 tablet (100 mg total) by mouth daily. 90 tablet 1  . omeprazole (PRILOSEC) 20 MG capsule Take 1 capsule (20 mg total) by mouth daily. 90 capsule 1  . PARoxetine (PAXIL) 10 MG tablet Take 10 mg once daily for 3 to 7 days; may increase dose based on response after 2 weeks to 20mg  daily. 30 tablet 1  . pregabalin (LYRICA) 100 MG capsule Take 1 capsule (100 mg total) by mouth 2 (two) times daily. 180 capsule 1  . loperamide (IMODIUM A-D) 2  MG tablet Take 1 tablet (2 mg total) by mouth 4 (four) times daily as needed for diarrhea or loose stools. (Patient not taking: Reported on 06/20/2018) 60 tablet 0  . TRUEPLUS LANCETS 28G MISC 1 each by Does not apply route 3 (three) times daily. (Patient not taking: Reported on 05/25/2018) 100 each 11  . chlordiazePOXIDE (LIBRIUM) 25 MG capsule 50mg  PO TID x 1D, then 25-50mg  PO BID X 1D, then 25-50mg  PO QD X 1D (Patient not taking: Reported on 06/20/2018) 10 capsule 0   No facility-administered medications prior to visit.     Allergies  Allergen Reactions  . Bee Venom Anaphylaxis       Objective:    BP 138/82 (BP Location: Left Arm, Patient Position: Sitting, Cuff Size: Normal)   Pulse 80   Temp 98.6 F (37 C) (Oral)   Ht 5\' 11"  (1.803 m)   Wt 171 lb (77.6 kg)   SpO2 98%   BMI 23.85 kg/m  Wt Readings from Last 3 Encounters:  06/20/18 171 lb (77.6 kg)  05/25/18 172 lb 6.4 oz (78.2 kg)  04/21/18 172 lb (78 kg)    Physical Exam  Constitutional: He is oriented to person, place, and time. He is cooperative.  Disheveled appearance  HENT:  Head: Normocephalic and atraumatic.  Eyes: EOM are normal.  Neck: Normal range of motion.  Cardiovascular: Normal rate, regular rhythm and normal heart sounds. Exam reveals no gallop and no friction rub.  No murmur heard. Pulmonary/Chest: Effort normal and breath sounds normal. No tachypnea. No respiratory distress. He has no decreased breath sounds. He has no wheezes. He has no rhonchi. He has no rales. He exhibits no tenderness.  Abdominal: Bowel sounds are normal.  Musculoskeletal: Normal range of motion. He exhibits no edema.  Neurological: He is alert and oriented to person, place, and time. Coordination normal.  Skin: Skin is warm and  dry.  Psychiatric: He has a normal mood and affect. His behavior is normal. Judgment and thought content normal.  Nursing note and vitals reviewed.       Patient has been counseled extensively about  nutrition and exercise as well as the importance of adherence with medications and regular follow-up. The patient was given clear instructions to go to ER or return to medical center if symptoms don't improve, worsen or new problems develop. The patient verbalized understanding.   Follow-up: Return in about 9 weeks (around 08/22/2018) for DM.   [name], FNP-BC [facility] [place], [place]   06/20/2018, 5:50 PM

## 2018-06-20 NOTE — Telephone Encounter (Signed)
Please resend the Chlordiazepoxide to [facility], this is controlled and cannot be filled at [facility]. Thanks!

## 2018-06-27 ENCOUNTER — Ambulatory Visit (HOSPITAL_COMMUNITY)
Admission: EM | Admit: 2018-06-27 | Discharge: 2018-06-27 | Disposition: A | Discharge status: Home / Self Care | Payer: Self-pay | Attending: Family Medicine | Admitting: Family Medicine

## 2018-06-27 ENCOUNTER — Encounter (HOSPITAL_COMMUNITY): Payer: Self-pay | Admitting: Emergency Medicine

## 2018-06-27 DIAGNOSIS — L03116 Cellulitis of left lower limb: Secondary | ICD-10-CM

## 2018-06-27 DIAGNOSIS — Z5189 Encounter for other specified aftercare: Secondary | ICD-10-CM

## 2018-06-27 MED ORDER — SULFAMETHOXAZOLE-TRIMETHOPRIM 800-160 MG PO TABS: 1.0000 | ORAL_TABLET | Freq: Two times a day (BID) | ORAL | 0 refills | Status: AC

## 2018-06-27 MED FILL — SULFAMETHOXAZOLE-TMP DS TAB: 800-160 | 10 days supply | Qty: 20 | Fill #0

## 2018-06-27 NOTE — ED Triage Notes (Signed)
Pt sts wound check to left leg; pt sts hx of cellulitis in past

## 2018-06-27 NOTE — ED Notes (Signed)
Instructed to put on gown for exam

## 2018-06-27 NOTE — ED Provider Notes (Signed)
[place]   [id number] 06/27/18 Arrival Time: 1228  ASSESSMENT & PLAN:  1. Cellulitis of left lower extremity   2. Visit for wound check    Meds ordered this encounter  Medications  . sulfamethoxazole-trimethoprim (BACTRIM DS,SEPTRA DS) 800-160 MG tablet    Sig: Take 1 tablet by mouth 2 (two) times daily for 10 days.    Dispense:  20 tablet    Refill:  0   Close observation.  Will follow up with PCP or here if worsening or failing to improve as anticipated. Reviewed expectations re: course of current medical issues. Questions answered. Outlined signs and symptoms indicating need for more acute intervention. Patient verbalized understanding. After Visit Summary given.   SUBJECTIVE:  [name] [name] is a 57 y.o. male with a PMH of DM who reports concern over a wound of his LLE. Present for a few days. No bleeding. H/O cellulitis and feels skin around wound is hot with increasing erythema since last evening; "more redness today". No drainage. No leg or calf swelling. No itching. Mild discomfort. Ambulatory without difficulty. No specific aggravating or alleviating factors reported. No OTC treatment reported. Afebrile.  ROS: As per HPI.  OBJECTIVE: Vitals:   06/27/18 1312  BP: (!) 155/87  Pulse: 85  Resp: 18  Temp: 97.8 F (36.6 C)  TempSrc: Oral  SpO2: 98%    General appearance: alert; no distress Lungs: clear to auscultation bilaterally Heart: regular rate and rhythm Extremities: no edema; no calf pain or swelling Skin: warm and dry; LLE with approx 1 cm superficial wound without bleeding; surrounding erythema that is hot to touch Psychological: alert and cooperative; normal mood and affect  Allergies  Allergen Reactions  . Bee Venom Anaphylaxis    Past Medical History:  Diagnosis Date  . Barrett's esophagus dx 2016  . Bronchitis   . Depression   . Diabetes ([place])   . ED (erectile dysfunction)   . GERD (gastroesophageal reflux disease)   .  Hepatitis C   . Hiatal hernia 10/2014   3cm  . Neuropathy    Social History   Socioeconomic History  . Marital status: Single    Spouse name: Not on file  . Number of children: Not on file  . Years of education: Not on file  . Highest education level: Not on file  Occupational History  . Not on file  Social Needs  . Financial resource strain: Not on file  . Food insecurity:    Worry: Not on file    Inability: Not on file  . Transportation needs:    Medical: Not on file    Non-medical: Not on file  Tobacco Use  . Smoking status: Current Every Day Smoker    Packs/day: 0.50    Years: 35.00    Pack years: 17.50    Types: Cigarettes  . Smokeless tobacco: Never Used  Substance and Sexual Activity  . Alcohol use: Yes    Alcohol/week: 27.0 standard drinks    Types: 6 Cans of beer, 21 Standard drinks or equivalent per week    Comment: 6 beers a week   . Drug use: No  . Sexual activity: Not Currently  Lifestyle  . Physical activity:    Days per week: Not on file    Minutes per session: Not on file  . Stress: Not on file  Relationships  . Social connections:    Talks on phone: Not on file    Gets together: Not on file  Attends religious service: Not on file    Active member of club or organization: Not on file    Attends meetings of clubs or organizations: Not on file    Relationship status: Not on file  . Intimate partner violence:    Fear of current or ex partner: Not on file    Emotionally abused: Not on file    Physically abused: Not on file    Forced sexual activity: Not on file  Other Topics Concern  . Not on file  Social History Narrative  . Not on file   Family History  Problem Relation Age of Onset  . Cancer Mother        breast  . Diabetes Mother   . Heart disease Maternal Grandfather   . Pancreatic cancer Paternal Grandmother   . Stroke Father   . Alcohol abuse Father   . Pancreatic cancer Father   . Colon cancer Neg Hx   . Stomach cancer Neg  Hx   . Rectal cancer Neg Hx   . Esophageal cancer Neg Hx    Past Surgical History:  Procedure Laterality Date  . APPENDECTOMY    . COLONOSCOPY N/A 02/16/2014   Procedure: COLONOSCOPY;  Surgeon: Gatha Mayer, MD;  Location: [facility] ENDOSCOPY;  Service: Endoscopy;  Laterality: N/A;  . COLONOSCOPY    . Evonnie Dawes, MD 06/29/18 [phone]

## 2018-08-10 MED FILL — LOSARTAN POTASSIUM 100 MG T: 100 | 30 days supply | Qty: 30 | Fill #1

## 2018-08-10 MED FILL — ATORVASTATIN 40 MG TABLET: 40 | 30 days supply | Qty: 30 | Fill #1

## 2018-08-10 MED FILL — OMEPRAZOLE DR 40 MG CAPSULE: 40 | 30 days supply | Qty: 30 | Fill #1

## 2018-08-10 MED FILL — PARoxetine HCL 10 MG TABS: 10 | 30 days supply | Qty: 30 | Fill #1

## 2018-08-22 ENCOUNTER — Ambulatory Visit: Payer: Self-pay | Admitting: Nurse Practitioner

## 2018-09-06 ENCOUNTER — Ambulatory Visit: Payer: Self-pay | Attending: Nurse Practitioner | Admitting: Nurse Practitioner

## 2018-09-06 ENCOUNTER — Encounter: Payer: Self-pay | Admitting: Nurse Practitioner

## 2018-09-06 VITALS — BP 156/79 | HR 86 | Temp 98.1°F | Ht 71.0 in | Wt 177.2 lb

## 2018-09-06 DIAGNOSIS — F17200 Nicotine dependence, unspecified, uncomplicated: Secondary | ICD-10-CM

## 2018-09-06 DIAGNOSIS — K219 Gastro-esophageal reflux disease without esophagitis: Secondary | ICD-10-CM | POA: Insufficient documentation

## 2018-09-06 DIAGNOSIS — IMO0001 Reserved for inherently not codable concepts without codable children: Secondary | ICD-10-CM

## 2018-09-06 DIAGNOSIS — Z833 Family history of diabetes mellitus: Secondary | ICD-10-CM | POA: Insufficient documentation

## 2018-09-06 DIAGNOSIS — E162 Hypoglycemia, unspecified: Secondary | ICD-10-CM | POA: Insufficient documentation

## 2018-09-06 DIAGNOSIS — Z79899 Other long term (current) drug therapy: Secondary | ICD-10-CM | POA: Insufficient documentation

## 2018-09-06 DIAGNOSIS — I10 Essential (primary) hypertension: Secondary | ICD-10-CM | POA: Insufficient documentation

## 2018-09-06 DIAGNOSIS — E1140 Type 2 diabetes mellitus with diabetic neuropathy, unspecified: Secondary | ICD-10-CM | POA: Insufficient documentation

## 2018-09-06 DIAGNOSIS — Z7982 Long term (current) use of aspirin: Secondary | ICD-10-CM | POA: Insufficient documentation

## 2018-09-06 DIAGNOSIS — F17210 Nicotine dependence, cigarettes, uncomplicated: Secondary | ICD-10-CM | POA: Insufficient documentation

## 2018-09-06 DIAGNOSIS — Z7984 Long term (current) use of oral hypoglycemic drugs: Secondary | ICD-10-CM | POA: Insufficient documentation

## 2018-09-06 DIAGNOSIS — S90812D Abrasion, left foot, subsequent encounter: Secondary | ICD-10-CM | POA: Insufficient documentation

## 2018-09-06 DIAGNOSIS — E1165 Type 2 diabetes mellitus with hyperglycemia: Secondary | ICD-10-CM | POA: Insufficient documentation

## 2018-09-06 DIAGNOSIS — Z7901 Long term (current) use of anticoagulants: Secondary | ICD-10-CM | POA: Insufficient documentation

## 2018-09-06 DIAGNOSIS — R21 Rash and other nonspecific skin eruption: Secondary | ICD-10-CM | POA: Insufficient documentation

## 2018-09-06 LAB — POCT GLYCOSYLATED HEMOGLOBIN (HGB A1C): HEMOGLOBIN A1C: 7.7 % — AB (ref 4.0–5.6)

## 2018-09-06 LAB — GLUCOSE, POCT (MANUAL RESULT ENTRY): POC Glucose: 194 mg/dl — AB (ref 70–99)

## 2018-09-06 MED ORDER — AMLODIPINE BESYLATE 5 MG PO TABS: 5.0000 mg | ORAL_TABLET | Freq: Every day | ORAL | 3 refills | Status: DC

## 2018-09-06 MED ORDER — ALBUTEROL SULFATE HFA 108 (90 BASE) MCG/ACT IN AERS: 2.0000 | INHALATION_SPRAY | Freq: Four times a day (QID) | RESPIRATORY_TRACT | 1 refills | Status: DC | PRN

## 2018-09-06 MED ORDER — CANAGLIFLOZIN 100 MG PO TABS: 100.0000 mg | ORAL_TABLET | Freq: Every day | ORAL | 1 refills | Status: AC

## 2018-09-06 MED ORDER — NICOTINE 14 MG/24HR TD PT24: 14.0000 mg | MEDICATED_PATCH | Freq: Every day | TRANSDERMAL | 0 refills | Status: AC

## 2018-09-06 MED ORDER — NICOTINE 7 MG/24HR TD PT24: 7.0000 mg | MEDICATED_PATCH | Freq: Every day | TRANSDERMAL | 0 refills | Status: AC

## 2018-09-06 MED ORDER — NICOTINE 21 MG/24HR TD PT24: 21.0000 mg | MEDICATED_PATCH | Freq: Every day | TRANSDERMAL | 0 refills | Status: AC

## 2018-09-06 MED ORDER — TRIAMCINOLONE ACETONIDE 0.025 % EX CREA: 1.0000 "application " | TOPICAL_CREAM | Freq: Two times a day (BID) | CUTANEOUS | 0 refills | Status: DC

## 2018-09-06 MED FILL — TRIAMCINOLONE 0.025% CREAM: 0.025 | 15 days supply | Qty: 30 | Fill #0

## 2018-09-06 MED FILL — INVOKANA 100 MG TABLET: 100 | 30 days supply | Qty: 30 | Fill #0

## 2018-09-06 MED FILL — AMLODIPINE BESYLATE 5 MG TA: 5 | 30 days supply | Qty: 30 | Fill #0

## 2018-09-06 MED FILL — NICOTINE 14 MG/24HR PATCH: 14 | 28 days supply | Qty: 28 | Fill #0

## 2018-09-06 MED FILL — NICOTINE 7 MG/24HR PATCH: 7 | 28 days supply | Qty: 28 | Fill #0

## 2018-09-06 MED FILL — NICODERM CQ 21 MG/24HR PATC: 21 | 28 days supply | Qty: 28 | Fill #0

## 2018-09-06 MED FILL — PARoxetine HCL 10 MG TABS: 10 | 30 days supply | Qty: 30 | Fill #2

## 2018-09-06 MED FILL — !VENTOLIN HFA INHALER: 108 (90 BAS | 25 days supply | Qty: 18 | Fill #0

## 2018-09-06 NOTE — Progress Notes (Signed)
Assessment & Plan:  [name] [name] was seen today for diabetes.  Diagnoses and all orders for this visit:  Uncontrolled type 2 diabetes mellitus without complication, without long-term current use of insulin (HCC) -     POCT glucose (manual entry) -     POCT glycosylated hemoglobin (Hb A1C) -     canagliflozin (INVOKANA) 100 MG TABS tablet; Take 1 tablet (100 mg total) by mouth daily before breakfast. Continue blood sugar control as discussed in office today, low carbohydrate diet, and regular physical exercise as tolerated, 150 minutes per week (30 min each day, 5 days per week, or 50 min 3 days per week). Keep blood sugar logs with fasting goal of 90-130 mg/dl, post prandial (after you eat) less than 180.  For Hypoglycemia: BS <60 and Hyperglycemia BS >400; contact the clinic ASAP. Annual eye exams and foot exams are recommended.   Essential hypertension -     amLODipine (NORVASC) 5 MG tablet; Take 1 tablet (5 mg total) by mouth daily. Continue all antihypertensives as prescribed.  Remember to bring in your blood pressure log with you for your follow up appointment.  DASH/Mediterranean Diets are healthier choices for HTN.    Foot abrasion, left, subsequent encounter -     DG Ankle Complete Left; Future  Skin rash -     triamcinolone (KENALOG) 0.025 % cream; Apply 1 application topically 2 (two) times daily.  Tobacco use disorder -     albuterol (VENTOLIN HFA) 108 (90 Base) MCG/ACT inhaler; Inhale 2 puffs into the lungs every 6 (six) hours as needed for wheezing or shortness of breath. -     nicotine (NICODERM CQ - DOSED IN MG/24 HOURS) 14 mg/24hr patch; Place 1 patch (14 mg total) onto the skin daily for 28 days. -     nicotine (NICODERM CQ - DOSED IN MG/24 HR) 7 mg/24hr patch; Place 1 patch (7 mg total) onto the skin daily for 28 days. -     nicotine (NICODERM CQ - DOSED IN MG/24 HOURS) 21 mg/24hr patch; Place 1 patch (21 mg total) onto the skin daily. [name] [name] was counseled on the  dangers of tobacco use, and was advised to quit. Reviewed strategies to maximize success, including removing cigarettes and smoking materials from environment, stress management and support of family/friends as well as pharmacological alternatives including: Wellbutrin, Chantix, Nicotine patch, Nicotine gum or lozenges. Smoking cessation support: smoking cessation hotline: 1-800-QUIT-NOW.  Smoking cessation classes are also available through [facility]. Call [phone] or visit our website at [email or web address]   A total of 5 minutes was spent on counseling for smoking cessation and Child is ready to quit.    Patient has been counseled on age-appropriate routine health concerns for screening and prevention. These are reviewed and up-to-date. Referrals have been placed accordingly. Immunizations are up-to-date or declined.    Subjective:   Chief Complaint  Patient presents with  . Diabetes   HPI [name] [name] 58 y.o. male presents to office today for follow up of DM.   DM TYPE 2 Chronic and stable. Current medications include invokana 100 mg daily. He denies any hypoglycemic symptoms. Taking lyrica for pain. He is not monitoring his blood glucose levels as instructed. Has started exercising and eating healthy although his weight is up several pounds.  Lab Results  Component Value Date   HGBA1C 7.7 (A) 09/06/2018    Essential Hypertension Not well controlled. Will start on amlodipine 5mg  today. Denies chest pain, shortness  of breath, palpitations, lightheadedness, dizziness, headaches or BLE edema.  BP Readings from Last 3 Encounters:  09/06/18 (!) 156/79  06/27/18 (!) 155/87  06/20/18 138/82    Rash: Patient complains of rash involving the left foot. Rash started several weeks ago. Rash has not changed over time and has not spread. Discomfort associated with rash: is pruritic.  Associated symptoms: none. Denies: fever. Patient has not had previous  evaluation of rash. Patient has not had previous treatment. Patient has not identified precipitant. Patient has not had new exposures (soaps, lotions, laundry detergents, foods, medications, plants, insects or animals.) He believes the rash may have come from a pair of used old shoes he has been wearing recently.    Foot Problem He has a healed wound on the left side of his foot. Denies any pain, trauma or injury to his foot. He is not sure how or when the wound started.   Review of Systems  Constitutional: Negative for fever, malaise/fatigue and weight loss.  HENT: Negative.  Negative for nosebleeds.   Eyes: Negative.  Negative for blurred vision, double vision and photophobia.  Respiratory: Negative.  Negative for cough and shortness of breath.   Cardiovascular: Negative.  Negative for chest pain, palpitations and leg swelling.  Gastrointestinal: Positive for heartburn. Negative for nausea and vomiting.  Musculoskeletal: Negative.  Negative for myalgias.       SEE HPI  Neurological: Positive for sensory change (neuropathy). Negative for dizziness, focal weakness, seizures and headaches.  Psychiatric/Behavioral: Positive for depression and substance abuse. Negative for suicidal ideas.    Past Medical History:  Diagnosis Date  . Barrett's esophagus dx 2016  . Bronchitis   . Depression   . Diabetes ([place])   . ED (erectile dysfunction)   . GERD (gastroesophageal reflux disease)   . Hepatitis C   . Hiatal hernia 10/2014   3cm  . Neuropathy     Past Surgical History:  Procedure Laterality Date  . APPENDECTOMY    . COLONOSCOPY N/A 02/16/2014   Procedure: COLONOSCOPY;  Surgeon: Gatha Mayer, MD;  Location: [facility];  Service: Endoscopy;  Laterality: N/A;  . COLONOSCOPY    . TONSILLECTOMY      Family History  Problem Relation Age of Onset  . Cancer Mother        breast  . Diabetes Mother   . Heart disease Maternal Grandfather   . Pancreatic cancer Paternal Grandmother     . Stroke Father   . Alcohol abuse Father   . Pancreatic cancer Father   . Colon cancer Neg Hx   . Stomach cancer Neg Hx   . Rectal cancer Neg Hx   . Esophageal cancer Neg Hx     Social History Reviewed with no changes to be made today.   Outpatient Medications Prior to Visit  Medication Sig Dispense Refill  . aspirin EC 81 MG tablet Take 1 tablet (81 mg total) by mouth daily. 30 tablet 11  . atorvastatin (LIPITOR) 40 MG tablet Take 1 tablet (40 mg total) by mouth daily. 90 tablet 1  . benztropine (COGENTIN) 0.5 MG tablet Take 0.5 mg by mouth daily.    . chlordiazePOXIDE (LIBRIUM) 25 MG capsule 50mg  PO TID x 1D, then 25-50mg  PO BID X 1D, then 25-50mg  PO QD X 1D 10 capsule 0  . loperamide (IMODIUM A-D) 2 MG tablet Take 1 tablet (2 mg total) by mouth 4 (four) times daily as needed for diarrhea or loose stools. 60 tablet 0  .  losartan (COZAAR) 100 MG tablet Take 1 tablet (100 mg total) by mouth daily. 90 tablet 1  . Multiple Vitamins-Minerals (MULTIVITAMIN ADULT) TABS Take 1 tablet by mouth daily. 30 tablet 11  . omeprazole (PRILOSEC) 40 MG capsule Take 1 capsule (40 mg total) by mouth daily. 90 capsule 1  . ondansetron (ZOFRAN) 4 MG tablet TAKE 1 TABLET BY MOUTH EVERY 8 HOURS AS NEEDED FOR NAUSEA OR VOMITING. 30 tablet 0  . PARoxetine (PAXIL) 10 MG tablet Take 1 tablet (10 mg total) by mouth daily. 90 tablet 1  . pregabalin (LYRICA) 100 MG capsule Take 1 capsule (100 mg total) by mouth 2 (two) times daily. 180 capsule 1  . TRUEPLUS LANCETS 28G MISC 1 each by Does not apply route 3 (three) times daily. 100 each 11  . albuterol (VENTOLIN HFA) 108 (90 Base) MCG/ACT inhaler Inhale 2 puffs into the lungs every 6 (six) hours as needed for wheezing or shortness of breath. 18 each 0   No facility-administered medications prior to visit.     Allergies  Allergen Reactions  . Bee Venom Anaphylaxis       Objective:    BP (!) 156/79   Pulse 86   Temp 98.1 F (36.7 C) (Oral)   Ht 5\' 11"   (1.803 m)   Wt 177 lb 3.2 oz (80.4 kg)   SpO2 98%   BMI 24.71 kg/m  Wt Readings from Last 3 Encounters:  09/06/18 177 lb 3.2 oz (80.4 kg)  06/20/18 171 lb (77.6 kg)  05/25/18 172 lb 6.4 oz (78.2 kg)    Physical Exam Vitals signs and nursing note reviewed.  Constitutional:      Appearance: He is well-developed.  HENT:     Head: Normocephalic and atraumatic.  Neck:     Musculoskeletal: Normal range of motion.  Cardiovascular:     Rate and Rhythm: Normal rate and regular rhythm.     Pulses:          Dorsalis pedis pulses are 2+ on the right side and 2+ on the left side.       Posterior tibial pulses are 2+ on the right side and 2+ on the left side.     Heart sounds: Normal heart sounds. No murmur. No friction rub. No gallop.   Pulmonary:     Effort: Pulmonary effort is normal. No tachypnea or respiratory distress.     Breath sounds: Normal breath sounds. No decreased breath sounds, wheezing, rhonchi or rales.  Chest:     Chest wall: No tenderness.  Abdominal:     General: Bowel sounds are normal.     Palpations: Abdomen is soft.  Musculoskeletal: Normal range of motion.       Feet:  Feet:     Right foot:     Skin integrity: Dry skin present.     Toenail Condition: Right toenails are abnormally thick. Fungal disease present.    Left foot:     Skin integrity: Dry skin present.     Toenail Condition: Left toenails are abnormally thick. Fungal disease present. Skin:    General: Skin is warm and dry.  Neurological:     Mental Status: He is alert and oriented to person, place, and time.     Coordination: Coordination normal.  Psychiatric:        Attention and Perception: Attention normal.        Mood and Affect: Mood normal.        Speech: Speech normal.  Behavior: Behavior normal. Behavior is cooperative.        Thought Content: Thought content normal.        Cognition and Memory: Cognition and memory normal.        Judgment: Judgment normal.        Patient  has been counseled extensively about nutrition and exercise as well as the importance of adherence with medications and regular follow-up. The patient was given clear instructions to go to ER or return to medical center if symptoms don't improve, worsen or new problems develop. The patient verbalized understanding.   Follow-up: Return in about 3 weeks (around 09/27/2018) for BP recheck with luke and follow up with me in 3 months for DM.   [name], FNP-BC [facility], [employer]   09/06/2018, 9:59 PM

## 2018-09-27 ENCOUNTER — Encounter: Payer: Self-pay | Admitting: Pharmacist

## 2018-10-10 ENCOUNTER — Encounter (HOSPITAL_COMMUNITY): Payer: Self-pay | Admitting: Emergency Medicine

## 2018-10-10 ENCOUNTER — Ambulatory Visit (INDEPENDENT_AMBULATORY_CARE_PROVIDER_SITE_OTHER): Payer: Self-pay

## 2018-10-10 ENCOUNTER — Ambulatory Visit (HOSPITAL_COMMUNITY)
Admission: EM | Admit: 2018-10-10 | Discharge: 2018-10-10 | Disposition: A | Discharge status: Home / Self Care | Payer: Self-pay | Attending: Family Medicine | Admitting: Family Medicine

## 2018-10-10 DIAGNOSIS — R2242 Localized swelling, mass and lump, left lower limb: Secondary | ICD-10-CM

## 2018-10-10 DIAGNOSIS — S92512A Displaced fracture of proximal phalanx of left lesser toe(s), initial encounter for closed fracture: Secondary | ICD-10-CM

## 2018-10-10 MED ORDER — CEPHALEXIN 500 MG PO CAPS: 500.0000 mg | ORAL_CAPSULE | Freq: Four times a day (QID) | ORAL | 0 refills | Status: DC

## 2018-10-10 NOTE — Discharge Instructions (Addendum)
You have a fracture to the second toe on the left foot. We will buddy tape the toes and place you in a postop shoe You can do ice to the area multiple times a day for at least 10 minutes at a time and elevate. The ice will help with some of the redness and swelling You need to follow-up with orthopedics for continued or worsening symptoms

## 2018-10-10 NOTE — ED Provider Notes (Signed)
[name] [name]    CSN: [id number] Arrival date & time: 10/10/18  1424     History   Chief Complaint Chief Complaint  Patient presents with  . Appointment    245  . Foot Swelling    HPI [name] [name] is a 58 y.o. male.   Is a 58 year old male with past medical history of diabetes, GERD, hep C, neuropathy, depression, bronchitis, Barrett's esophagus.  He presents with approximately 3 days of left foot redness, swelling that extends into the left ankle.  It is mildly painful.  His symptoms have been constant and worsening.  He also has a sore that is on the lateral aspect of his foot that is been there for approximately 6 months that never healed.  He does smoke.  He denies any history of any PVD or PAD.  He has had cellulitis before in the other extremity.  He has low-grade temperature here today.  He denies any nausea, vomiting, myalgias.  He has not taken anything for his symptoms.  He denies any injuries to the foot or history of gout.  ROS per HPI      Past Medical History:  Diagnosis Date  . Barrett's esophagus dx 2016  . Bronchitis   . Depression   . Diabetes ([place])   . ED (erectile dysfunction)   . GERD (gastroesophageal reflux disease)   . Hepatitis C   . Hiatal hernia 10/2014   3cm  . Neuropathy     Patient Active Problem List   Diagnosis Date Noted  . Alcohol abuse with alcohol-induced mood disorder ([place]) 12/17/2017  . Hereditary hemochromatosis ([place]) 11/23/2017  . 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 Essential hypertension 04/02/2017  . Hypogonadism male 08/13/2016  . ETOH abuse 06/22/2016  . Low serum testosterone level 01/08/2016  . Uncontrolled type 2 diabetes mellitus without complication, without long-term current use of insulin ([place]) 01/02/2016  . Erectile dysfunction 09/12/2015  . Hepatic cirrhosis ([place]) 01/10/2015  . Chronic hepatitis C without hepatic coma ([place]) 10/31/2014  . Tobacco use disorder 08/03/2013     Past Surgical History:  Procedure Laterality Date  . APPENDECTOMY    . COLONOSCOPY N/A 02/16/2014   Procedure: COLONOSCOPY;  Surgeon: Gatha Mayer, MD;  Location: [facility];  Service: Endoscopy;  Laterality: N/A;  . COLONOSCOPY    . TONSILLECTOMY         Home Medications    Prior to Admission medications   Medication Sig Start Date End Date Taking? Authorizing Provider  albuterol (VENTOLIN HFA) 108 (90 Base) MCG/ACT inhaler Inhale 2 puffs into the lungs every 6 (six) hours as needed for wheezing or shortness of breath. 09/06/18   [name], NP  amLODipine (NORVASC) 5 MG tablet Take 1 tablet (5 mg total) by mouth daily. 09/06/18   [name], NP  aspirin EC 81 MG tablet Take 1 tablet (81 mg total) by mouth daily. 04/02/17   Funches, Adriana Mccallum, MD  atorvastatin (LIPITOR) 40 MG tablet Take 1 tablet (40 mg total) by mouth daily. 06/20/18   [name], NP  benztropine (COGENTIN) 0.5 MG tablet Take 0.5 mg by mouth daily.    [provider]  canagliflozin (INVOKANA) 100 MG TABS tablet Take 1 tablet (100 mg total) by mouth daily before breakfast. 09/06/18 12/05/18  [name], NP  cephALEXin (KEFLEX) 500 MG capsule Take 1 capsule (500 mg total) by mouth 4 (four) times daily. 10/10/18   Orvan July, NP  chlordiazePOXIDE (LIBRIUM) 25 MG capsule 50mg  PO TID x 1D, then 25-50mg  PO BID X 1D, then 25-50mg  PO QD X 1D 06/20/18   [name], NP  loperamide (IMODIUM A-D) 2 MG tablet Take 1 tablet (2 mg total) by mouth 4 (four) times daily as needed for diarrhea or loose stools. 04/18/18   [name], NP  losartan (COZAAR) 100 MG tablet Take 1 tablet (100 mg total) by mouth daily. 06/20/18   [name], NP  Multiple Vitamins-Minerals (MULTIVITAMIN ADULT) TABS Take 1 tablet by mouth daily. 04/02/17   Funches, Adriana Mccallum, MD  nicotine (NICODERM CQ - DOSED IN MG/24 HOURS) 14 mg/24hr patch Place 1 patch (14 mg total) onto the skin daily for 28 days. 10/18/18 11/15/18   [name], NP  nicotine (NICODERM CQ - DOSED IN MG/24 HOURS) 21 mg/24hr patch Place 1 patch (21 mg total) onto the skin daily. 09/06/18 10/18/18  [name], NP  nicotine (NICODERM CQ - DOSED IN MG/24 HR) 7 mg/24hr patch Place 1 patch (7 mg total) onto the skin daily for 28 days. 11/16/18 12/14/18  [name], NP  omeprazole (PRILOSEC) 40 MG capsule Take 1 capsule (40 mg total) by mouth daily. 06/20/18 09/18/18  [name], NP  ondansetron (ZOFRAN) 4 MG tablet TAKE 1 TABLET BY MOUTH EVERY 8 HOURS AS NEEDED FOR NAUSEA OR VOMITING. 06/01/18   [name], NP  PARoxetine (PAXIL) 10 MG tablet Take 1 tablet (10 mg total) by mouth daily. 06/20/18   [name], NP  pregabalin (LYRICA) 100 MG capsule Take 1 capsule (100 mg total) by mouth 2 (two) times daily. 06/20/18   [name], NP  triamcinolone (KENALOG) 0.025 % cream Apply 1 application topically 2 (two) times daily. 09/06/18   [name], NP  TRUEPLUS LANCETS 28G MISC 1 each by Does not apply route 3 (three) times daily. 02/18/16   Boykin Nearing, MD    Family History Family History  Problem Relation Age of Onset  . Cancer Mother        breast  . Diabetes Mother   . Heart disease Maternal Grandfather   . Pancreatic cancer Paternal Grandmother   . Stroke Father   . Alcohol abuse Father   . Pancreatic cancer Father   . Colon cancer Neg Hx   . Stomach cancer Neg Hx   . Rectal cancer Neg Hx   . Esophageal cancer Neg Hx     Social History Social History   Tobacco Use  . Smoking status: Current Every Day Smoker    Packs/day: 0.50    Years: 35.00    Pack years: 17.50    Types: Cigarettes  . Smokeless tobacco: Never Used  Substance Use Topics  . Alcohol use: Yes    Alcohol/week: 27.0 standard drinks    Types: 6 Cans of beer, 21 Standard drinks or equivalent per week    Comment: 6 beers a week   . Drug use: No     Allergies   Bee venom   Review of Systems Review of  Systems   Physical Exam Triage Vital Signs ED Triage Vitals [10/10/18 1453]  Enc Vitals Group     BP (!) 150/84     Pulse Rate 85     Resp 18     Temp 99.5 F (37.5 C)     Temp src      SpO2 96 %     Weight  Height      Head Circumference      Peak Flow      Pain Score 0     Pain Loc      Pain Edu?      Excl. in [place]?    No data found.  Updated Vital Signs BP (!) 150/84   Pulse 85   Temp 99.5 F (37.5 C)   Resp 18   SpO2 96%   Visual Acuity Right Eye Distance:   Left Eye Distance:   Bilateral Distance:    Right Eye Near:   Left Eye Near:    Bilateral Near:     Physical Exam Vitals signs and nursing note reviewed.  Constitutional:      Appearance: He is well-developed.  HENT:     Head: Normocephalic and atraumatic.  Eyes:     Conjunctiva/sclera: Conjunctivae normal.  Neck:     Musculoskeletal: Normal range of motion and neck supple.  Pulmonary:     Effort: Pulmonary effort is normal.  Musculoskeletal: Normal range of motion.        General: Swelling and tenderness present.     Left lower leg: Edema present.     Comments: Redness, swelling to the left foot extending into the left ankle. Sensation and pedal pulse intact. Good range of motion More swelling to the left second toe.  1 open sore to the lateral foot which is healing No new open sores  Skin:    General: Skin is warm and dry.     Findings: Erythema present.  Neurological:     Mental Status: He is alert.          UC Treatments / Results  Labs (all labs ordered are listed, but only abnormal results are displayed) Labs Reviewed - No data to display  EKG None  Radiology Dg Foot Complete Left  Result Date: 10/10/2018 CLINICAL DATA:  Acute left foot swelling. EXAM: LEFT FOOT - COMPLETE 3+ VIEW COMPARISON:  None. FINDINGS: Mildly displaced oblique fracture is seen involving the second proximal phalanx. No other bony abnormality is noted. Joint spaces are intact. No soft tissue  abnormality is noted. IMPRESSION: Mildly displaced second proximal phalangeal fracture. Electronically Signed   By: Marijo Conception, M.D.   On: 10/10/2018 15:28    Procedures Procedures (including critical care time)  Medications Ordered in UC Medications - No data to display  Initial Impression / Assessment and Plan / UC Course  I have reviewed the triage vital signs and the nursing notes.  Pertinent labs & imaging results that were available during my care of the patient were reviewed by me and considered in my medical decision making (see chart for details).     58 year old male with past medical history of diabetes here for left foot pain, redness and swelling, denies injury.  Doing x-ray to rule out any osteomyelitis If x-ray is normal we will go ahead with Rocephin injection here in clinic and sent home with oral antibiotics Strict precautions that if the redness and swelling starts extending up the leg he will need to go to the ER  X-ray revealed close displaced fracture of the proximal phalanx of the second toe of the left foot. We will buddy tape toes and place in postop shoe. We will go ahead and cover with oral antibiotics based on hx and presentation and low grade fever. Most likely inflammation for the fracture.  Told to follow up with orthopedics for continued or worsening symptoms.  Final Clinical Impressions(s) / UC Diagnoses   Final diagnoses:  Closed displaced fracture of proximal phalanx of lesser toe of left foot, initial encounter     Discharge Instructions     You have a fracture to the second toe on the left foot. We will buddy tape the toes and place you in a postop shoe You can do ice to the area multiple times a day for at least 10 minutes at a time and elevate. The ice will help with some of the redness and swelling You need to follow-up with orthopedics for continued or worsening symptoms      ED Prescriptions    Medication Sig Dispense Auth.  Provider   cephALEXin (KEFLEX) 500 MG capsule Take 1 capsule (500 mg total) by mouth 4 (four) times daily. 28 capsule Loura Halt A, NP     Controlled Substance Prescriptions [place]  Controlled Substance Registry consulted? Not Applicable   Orvan July, NP 10/10/18 1551

## 2018-10-10 NOTE — ED Triage Notes (Signed)
Pt states on Saturday he noticed his L foot started to swell. Pt is diabetic, states he burned his foot on the back L side 6 months ago that never fully healed.

## 2018-10-11 MED FILL — CEPHALEXIN 500 MG CAPSULE: 500 | 7 days supply | Qty: 28 | Fill #0

## 2018-10-11 MED FILL — PAROXETINE HCL 10 MG TABS: 10 | 30 days supply | Qty: 30 | Fill #3

## 2018-10-11 MED FILL — OMEPRAZOLE DR 40 MG CAPSULE: 40 | 30 days supply | Qty: 30 | Fill #2

## 2018-10-11 MED FILL — INVOKANA 100 MG TABLET: 100 | 30 days supply | Qty: 30 | Fill #1

## 2018-10-11 MED FILL — LOSARTAN POTASSIUM 100 MG T: 100 | 30 days supply | Qty: 30 | Fill #2

## 2018-10-11 MED FILL — AMLODIPINE BESYLATE 5 MG TA: 5 | 30 days supply | Qty: 30 | Fill #1

## 2018-10-11 MED FILL — ATORVASTATIN CALCIUM 40 MG: 40 | 30 days supply | Qty: 30 | Fill #2

## 2018-11-28 ENCOUNTER — Other Ambulatory Visit: Payer: Self-pay | Admitting: Nurse Practitioner

## 2018-11-28 DIAGNOSIS — F1020 Alcohol dependence, uncomplicated: Secondary | ICD-10-CM

## 2018-11-28 NOTE — Telephone Encounter (Signed)
Refill Request.  

## 2018-11-30 MED FILL — AMLODIPINE BESYLATE 5 MG TA: 5 | 30 days supply | Qty: 30 | Fill #2

## 2018-11-30 MED FILL — PAROXETINE HCL 10 MG TABS: 10 | 30 days supply | Qty: 30 | Fill #4

## 2018-11-30 MED FILL — ATORVASTATIN CALCIUM 40 MG: 40 | 30 days supply | Qty: 30 | Fill #3

## 2018-11-30 MED FILL — OMEPRAZOLE DR 40 MG CAPSULE: 40 | 30 days supply | Qty: 30 | Fill #3

## 2018-11-30 MED FILL — NICOTINE 21 MG/24HR PATCH: 21 | 14 days supply | Qty: 14 | Fill #1

## 2018-11-30 MED FILL — INVOKANA 100 MG TABLET: 100 | 30 days supply | Qty: 30 | Fill #2

## 2018-11-30 MED FILL — LOSARTAN POTASSIUM 100 MG T: 100 | 30 days supply | Qty: 30 | Fill #3

## 2018-12-06 ENCOUNTER — Encounter: Payer: Self-pay | Admitting: Pharmacist

## 2018-12-06 ENCOUNTER — Other Ambulatory Visit: Payer: Self-pay | Admitting: Nurse Practitioner

## 2018-12-06 ENCOUNTER — Other Ambulatory Visit: Payer: Self-pay

## 2018-12-06 ENCOUNTER — Encounter: Payer: Self-pay | Admitting: Nurse Practitioner

## 2018-12-06 ENCOUNTER — Ambulatory Visit: Payer: Self-pay | Attending: Nurse Practitioner | Admitting: Nurse Practitioner

## 2018-12-06 VITALS — BP 132/74 | HR 97 | Temp 99.5°F | Ht 71.0 in | Wt 171.4 lb

## 2018-12-06 DIAGNOSIS — F1020 Alcohol dependence, uncomplicated: Secondary | ICD-10-CM

## 2018-12-06 DIAGNOSIS — E1165 Type 2 diabetes mellitus with hyperglycemia: Secondary | ICD-10-CM

## 2018-12-06 DIAGNOSIS — IMO0001 Reserved for inherently not codable concepts without codable children: Secondary | ICD-10-CM

## 2018-12-06 DIAGNOSIS — M79674 Pain in right toe(s): Secondary | ICD-10-CM

## 2018-12-06 DIAGNOSIS — I10 Essential (primary) hypertension: Secondary | ICD-10-CM

## 2018-12-06 LAB — POCT GLYCOSYLATED HEMOGLOBIN (HGB A1C): Hemoglobin A1C: 7.8 % — AB (ref 4.0–5.6)

## 2018-12-06 LAB — GLUCOSE, POCT (MANUAL RESULT ENTRY): POC Glucose: 239 mg/dl — AB (ref 70–99)

## 2018-12-06 MED ORDER — CHLORDIAZEPOXIDE HCL 25 MG PO CAPS: ORAL_CAPSULE | ORAL | 0 refills | Status: DC

## 2018-12-06 MED ORDER — AMLODIPINE BESYLATE 10 MG PO TABS: 10.0000 mg | ORAL_TABLET | Freq: Every day | ORAL | 0 refills | Status: DC

## 2018-12-06 MED ORDER — CANAGLIFLOZIN 100 MG PO TABS: 100.0000 mg | ORAL_TABLET | Freq: Every day | ORAL | 1 refills | Status: DC

## 2018-12-06 MED ORDER — GLIPIZIDE 5 MG PO TABS: 5.0000 mg | ORAL_TABLET | Freq: Every day | ORAL | 3 refills | Status: DC

## 2018-12-06 MED ORDER — CHLORDIAZEPOXIDE HCL 25 MG PO CAPS: ORAL_CAPSULE | ORAL | 1 refills | Status: DC

## 2018-12-06 NOTE — Progress Notes (Signed)
Assessment & Plan:  [name] [name] was seen today for follow-up.  Diagnoses and all orders for this visit:  Uncontrolled type 2 diabetes mellitus without complication, without long-term current use of insulin (HCC) -     Glucose (CBG) -     HgB A1c -     glipiZIDE (GLUCOTROL) 5 MG tablet; Take 1 tablet (5 mg total) by mouth daily before breakfast. -     canagliflozin (INVOKANA) 100 MG TABS tablet; Take 1 tablet (100 mg total) by mouth daily before breakfast. -     CMP14+EGFR Diabetes is poorly controlled. Advised patient to keep a fasting blood sugar log fast, 2 hours post lunch and bedtime which will be reviewed at the next office visit.  Pain in right toe(s) Instructions given to buddy tape toe. He declines xray or pain medication. No signs of infection to warrant antibiotics.   Essential hypertension -     amLODipine (NORVASC) 10 MG tablet; Take 1 tablet (10 mg total) by mouth daily. Continue all antihypertensives as prescribed.  Remember to bring in your blood pressure log with you for your follow up appointment.  DASH/Mediterranean Diets are healthier choices for HTN.    Alcohol dependence, episodic drinking behavior (HCC) -     chlordiazePOXIDE (LIBRIUM) 25 MG capsule; 49m PO TID x 1D, then 25-575mPO BID X 1D, then 25-5043mO QD X 1D    Patient has been counseled on age-appropriate routine health concerns for screening and prevention. These are reviewed and up-to-date. Referrals have been placed accordingly. Immunizations are up-to-date or declined.    Subjective:   Chief Complaint  Patient presents with  . Follow-up    Pt. is here for a follow-up on DM. Pt. stated he broke his toe by stepping in carpet two weeks ago.    HPI [name] [name] 5o. male presents to office today for follow up to DM and with complaints of right 2nd toe pain. He has a history of GERD, Hep C, alcohol abuse, diabetic neuropathy, poorly controlled DM, depression, barrett's esophagus.   DM Type 2  Checking blood sugars but not routinely. Reports readings in the 200s. Endorses medication compliance taking invokana 100m69mill restart him on glipizide 5mg 109mly. He was instructed to increase glipizide to twice a day if blood glucose readings are consistently greater than 180 postprandial. He verbalized understanding.  Lab Results  Component Value Date   HGBA1C 7.8 (A) 12/06/2018    Toe Pain Complaints of right 2nd toe pain. States he tripped over rug and hit his toe on the center post of the bed. He immediately felt pain and noticed swelling. He declines xray today. Skin is intact. There are no signs of skin infection such as cellulitis.   Essential Hypertension Chronic and stable today. He does not monitor his blood pressure at home. Endorses medication compliance taking amlodipine 10mg 68my, losartan 100 mg daily. Denies chest pain, shortness of breath, palpitations, lightheadedness, dizziness, headaches or BLE edema.  BP Readings from Last 3 Encounters:  12/06/18 132/74  10/10/18 (!) 150/84  09/06/18 (!) 156/79   Alcohol dependence He is still drinking alcohol daily and smoking cigarettes. Has not started using his nicotine patches. He does attend AA meetings which he states are not very useful. States his sponsor also drinks alcohol. I have encouraged him to locate a new sponsor and different AA locAllensworthion for more support. He declines speaking with the onsite social worker today. Denies any current thoughts of self harm and  states daily use of Paxil 39m helps improve his mood lability.     Review of Systems  Constitutional: Negative for fever, malaise/fatigue and weight loss.  HENT: Negative.  Negative for nosebleeds.   Eyes: Negative.  Negative for blurred vision, double vision and photophobia.  Respiratory: Negative.  Negative for cough and shortness of breath.   Cardiovascular: Negative.  Negative for chest pain, palpitations and leg swelling.  Gastrointestinal: Negative.   Negative for heartburn, nausea and vomiting.  Musculoskeletal: Negative.  Negative for myalgias.  Neurological: Negative.  Negative for dizziness, focal weakness, seizures and headaches.  Psychiatric/Behavioral: Negative.  Negative for suicidal ideas.    Past Medical History:  Diagnosis Date  . Barrett's esophagus dx 2016  . Bronchitis   . Depression   . Diabetes ([place]   . ED (erectile dysfunction)   . GERD (gastroesophageal reflux disease)   . Hepatitis C   . Hiatal hernia 10/2014   3cm  . Neuropathy     Past Surgical History:  Procedure Laterality Date  . APPENDECTOMY    . COLONOSCOPY N/A 02/16/2014   Procedure: COLONOSCOPY;  Surgeon: CGatha Mayer MD;  Location: [facility];  Service: Endoscopy;  Laterality: N/A;  . COLONOSCOPY    . TONSILLECTOMY      Family History  Problem Relation Age of Onset  . Cancer Mother        breast  . Diabetes Mother   . Heart disease Maternal Grandfather   . Pancreatic cancer Paternal Grandmother   . Stroke Father   . Alcohol abuse Father   . Pancreatic cancer Father   . Colon cancer Neg Hx   . Stomach cancer Neg Hx   . Rectal cancer Neg Hx   . Esophageal cancer Neg Hx     Social History Reviewed with no changes to be made today.   Outpatient Medications Prior to Visit  Medication Sig Dispense Refill  . albuterol (VENTOLIN HFA) 108 (90 Base) MCG/ACT inhaler Inhale 2 puffs into the lungs every 6 (six) hours as needed for wheezing or shortness of breath. 18 each 1  . aspirin EC 81 MG tablet Take 1 tablet (81 mg total) by mouth daily. 30 tablet 11  . atorvastatin (LIPITOR) 40 MG tablet Take 1 tablet (40 mg total) by mouth daily. 90 tablet 1  . loperamide (IMODIUM A-D) 2 MG tablet Take 1 tablet (2 mg total) by mouth 4 (four) times daily as needed for diarrhea or loose stools. 60 tablet 0  . losartan (COZAAR) 100 MG tablet Take 1 tablet (100 mg total) by mouth daily. 90 tablet 1  . Multiple Vitamins-Minerals (MULTIVITAMIN ADULT) TABS  Take 1 tablet by mouth daily. 30 tablet 11  . ondansetron (ZOFRAN) 4 MG tablet TAKE 1 TABLET BY MOUTH EVERY 8 HOURS AS NEEDED FOR NAUSEA OR VOMITING. 30 tablet 0  . PARoxetine (PAXIL) 10 MG tablet Take 1 tablet (10 mg total) by mouth daily. 90 tablet 1  . pregabalin (LYRICA) 100 MG capsule Take 1 capsule (100 mg total) by mouth 2 (two) times daily. 180 capsule 1  . triamcinolone (KENALOG) 0.025 % cream Apply 1 application topically 2 (two) times daily. 30 g 0  . TRUEPLUS LANCETS 28G MISC 1 each by Does not apply route 3 (three) times daily. 100 each 11  . amLODipine (NORVASC) 5 MG tablet Take 1 tablet (5 mg total) by mouth daily. 90 tablet 3  . cephALEXin (KEFLEX) 500 MG capsule Take 1 capsule (500 mg total) by  mouth 4 (four) times daily. 28 capsule 0  . benztropine (COGENTIN) 0.5 MG tablet Take 0.5 mg by mouth daily.    . nicotine (NICODERM CQ - DOSED IN MG/24 HR) 7 mg/24hr patch Place 1 patch (7 mg total) onto the skin daily for 28 days. (Patient not taking: Reported on 12/06/2018) 28 patch 0  . omeprazole (PRILOSEC) 40 MG capsule Take 1 capsule (40 mg total) by mouth daily. 90 capsule 1  . chlordiazePOXIDE (LIBRIUM) 25 MG capsule 4m PO TID x 1D, then 25-578mPO BID X 1D, then 25-5028mO QD X 1D (Patient not taking: Reported on 12/06/2018) 10 capsule 0   No facility-administered medications prior to visit.     Allergies  Allergen Reactions  . Bee Venom Anaphylaxis       Objective:    BP 132/74 (BP Location: Right Arm, Patient Position: Sitting, Cuff Size: Normal)   Pulse 97   Temp 99.5 F (37.5 C) (Oral)   Ht 5' 11"  (1.803 m)   Wt 171 lb 6.4 oz (77.7 kg)   SpO2 98%   BMI 23.91 kg/m  Wt Readings from Last 3 Encounters:  12/06/18 171 lb 6.4 oz (77.7 kg)  09/06/18 177 lb 3.2 oz (80.4 kg)  06/20/18 171 lb (77.6 kg)    Physical Exam       Patient has been counseled extensively about nutrition and exercise as well as the importance of adherence with medications and regular  follow-up. The patient was given clear instructions to go to ER or return to medical center if symptoms don't improve, worsen or new problems develop. The patient verbalized understanding.   Follow-up: Return in about 3 months (around 03/07/2019).   ZelGildardo PoundsNP-BC [facility] Hartrandt4/03/2019, 4:00 PM

## 2018-12-07 LAB — CMP14+EGFR
ALT: 110 IU/L — ABNORMAL HIGH (ref 0–44)
AST: 83 IU/L — ABNORMAL HIGH (ref 0–40)
Albumin/Globulin Ratio: 1.5 (ref 1.2–2.2)
Albumin: 4.8 g/dL (ref 3.8–4.9)
Alkaline Phosphatase: 77 IU/L (ref 39–117)
BUN/Creatinine Ratio: 22 — ABNORMAL HIGH (ref 9–20)
BUN: 14 mg/dL (ref 6–24)
Bilirubin Total: 0.8 mg/dL (ref 0.0–1.2)
CO2: 21 mmol/L (ref 20–29)
Calcium: 9.8 mg/dL (ref 8.7–10.2)
Chloride: 100 mmol/L (ref 96–106)
Creatinine, Ser: 0.65 mg/dL — ABNORMAL LOW (ref 0.76–1.27)
GFR calc Af Amer: 126 mL/min/{1.73_m2} (ref >59)
GFR calc non Af Amer: 109 mL/min/{1.73_m2} (ref >59)
Globulin, Total: 3.2 g/dL (ref 1.5–4.5)
Glucose: 158 mg/dL — ABNORMAL HIGH (ref 65–99)
Potassium: 5 mmol/L (ref 3.5–5.2)
Sodium: 139 mmol/L (ref 134–144)
Total Protein: 8 g/dL (ref 6.0–8.5)

## 2018-12-07 MED FILL — glipiZIDE 5 MG TABS: 5 | 30 days supply | Qty: 30 | Fill #0

## 2019-01-27 MED FILL — OMEPRAZOLE DR 40 MG CAPSULE: 40 | 30 days supply | Qty: 30 | Fill #4

## 2019-01-27 MED FILL — ?ATORVASTATIN 40MG TABLET: 40 | 30 days supply | Qty: 30 | Fill #4

## 2019-01-27 MED FILL — ?AMLODIPINE BESYLATE 5MG TA: 5 | 30 days supply | Qty: 30 | Fill #3

## 2019-01-27 MED FILL — PAROXETINE HCL 10 MG TABS: 10 | 30 days supply | Qty: 30 | Fill #5

## 2019-01-27 MED FILL — INVOKANA 100 MG TABLET: 100 | 30 days supply | Qty: 30 | Fill #3

## 2019-01-27 MED FILL — LOSARTAN POTASSIUM 100 MG T: 100 | 30 days supply | Qty: 30 | Fill #4

## 2019-02-13 ENCOUNTER — Encounter (HOSPITAL_COMMUNITY): Payer: Self-pay

## 2019-02-13 ENCOUNTER — Other Ambulatory Visit: Payer: Self-pay

## 2019-02-13 ENCOUNTER — Emergency Department (HOSPITAL_COMMUNITY): Payer: Self-pay

## 2019-02-13 ENCOUNTER — Emergency Department (HOSPITAL_COMMUNITY)
Admission: EM | Admit: 2019-02-13 | Discharge: 2019-02-14 | Disposition: A | Discharge status: Home / Self Care | Payer: Self-pay | Attending: Emergency Medicine | Admitting: Emergency Medicine

## 2019-02-13 DIAGNOSIS — F10920 Alcohol use, unspecified with intoxication, uncomplicated: Secondary | ICD-10-CM

## 2019-02-13 DIAGNOSIS — Z7984 Long term (current) use of oral hypoglycemic drugs: Secondary | ICD-10-CM | POA: Insufficient documentation

## 2019-02-13 DIAGNOSIS — M545 Low back pain, unspecified: Secondary | ICD-10-CM

## 2019-02-13 DIAGNOSIS — F10220 Alcohol dependence with intoxication, uncomplicated: Secondary | ICD-10-CM | POA: Insufficient documentation

## 2019-02-13 DIAGNOSIS — Z79899 Other long term (current) drug therapy: Secondary | ICD-10-CM | POA: Insufficient documentation

## 2019-02-13 DIAGNOSIS — I10 Essential (primary) hypertension: Secondary | ICD-10-CM | POA: Insufficient documentation

## 2019-02-13 DIAGNOSIS — E119 Type 2 diabetes mellitus without complications: Secondary | ICD-10-CM | POA: Insufficient documentation

## 2019-02-13 DIAGNOSIS — Z7982 Long term (current) use of aspirin: Secondary | ICD-10-CM | POA: Insufficient documentation

## 2019-02-13 DIAGNOSIS — F1020 Alcohol dependence, uncomplicated: Secondary | ICD-10-CM

## 2019-02-13 DIAGNOSIS — F17210 Nicotine dependence, cigarettes, uncomplicated: Secondary | ICD-10-CM | POA: Insufficient documentation

## 2019-02-13 DIAGNOSIS — Y908 Blood alcohol level of 240 mg/100 ml or more: Secondary | ICD-10-CM | POA: Insufficient documentation

## 2019-02-13 LAB — CBC
HCT: 47.1 % (ref 39.0–52.0)
Hemoglobin: 16.3 g/dL (ref 13.0–17.0)
MCH: 33.3 pg (ref 26.0–34.0)
MCHC: 34.6 g/dL (ref 30.0–36.0)
MCV: 96.3 fL (ref 80.0–100.0)
Platelets: 194 10*3/uL (ref 150–400)
RBC: 4.89 MIL/uL (ref 4.22–5.81)
RDW: 12.1 % (ref 11.5–15.5)
WBC: 7 10*3/uL (ref 4.0–10.5)
nRBC: 0 % (ref 0.0–0.2)

## 2019-02-13 LAB — COMPREHENSIVE METABOLIC PANEL
ALT: 213 U/L — ABNORMAL HIGH (ref 0–44)
AST: 194 U/L — ABNORMAL HIGH (ref 15–41)
Albumin: 5.1 g/dL — ABNORMAL HIGH (ref 3.5–5.0)
Alkaline Phosphatase: 80 U/L (ref 38–126)
Anion gap: 14 (ref 5–15)
BUN: 5 mg/dL — ABNORMAL LOW (ref 6–20)
CO2: 23 mmol/L (ref 22–32)
Calcium: 9.2 mg/dL (ref 8.9–10.3)
Chloride: 100 mmol/L (ref 98–111)
Creatinine, Ser: 0.55 mg/dL — ABNORMAL LOW (ref 0.61–1.24)
GFR calc Af Amer: >60 mL/min (ref >60)
GFR calc non Af Amer: >60 mL/min (ref >60)
Glucose, Bld: 210 mg/dL — ABNORMAL HIGH (ref 70–99)
Potassium: 3.8 mmol/L (ref 3.5–5.1)
Sodium: 137 mmol/L (ref 135–145)
Total Bilirubin: 0.6 mg/dL (ref 0.3–1.2)
Total Protein: 8.9 g/dL — ABNORMAL HIGH (ref 6.5–8.1)

## 2019-02-13 LAB — RAPID URINE DRUG SCREEN, HOSP PERFORMED
Amphetamines: NOT DETECTED
Barbiturates: NOT DETECTED
Benzodiazepines: NOT DETECTED
Cocaine: NOT DETECTED
Opiates: NOT DETECTED
Tetrahydrocannabinol: NOT DETECTED

## 2019-02-13 LAB — ETHANOL: Alcohol, Ethyl (B): 351 mg/dL (ref <10)

## 2019-02-13 MED ORDER — CHLORDIAZEPOXIDE HCL 25 MG PO CAPS: ORAL_CAPSULE | ORAL | 0 refills | Status: DC

## 2019-02-13 MED ORDER — ONDANSETRON 4 MG PO TBDP: 4.0000 mg | ORAL_TABLET | Freq: Three times a day (TID) | ORAL | 0 refills | Status: DC | PRN

## 2019-02-13 MED ORDER — ONDANSETRON 4 MG PO TBDP
4.0000 mg | ORAL_TABLET | Freq: Once | ORAL | Status: AC
  Administered 2019-02-13: 23:00:00 4 mg via ORAL
  Filled 2019-02-13: qty 1

## 2019-02-13 NOTE — ED Notes (Signed)
Pt noted vomiting in the restroom. Pt reports this happens on occasion when drinking. Pt reports when he's home he does not take anything for the vomiting. The PA Britni made aware. See EMAR for changes. Pt reports feeling nauseous.

## 2019-02-13 NOTE — Discharge Instructions (Signed)
Evaluated today for wanting detox for alcohol.  Follow-up outpatient with the resources provided.  We have given you a Librium taper for detox.  Also given you Zofran for nausea.  Please follow-up outpatient if you want to stop using alcohol.  Your xray of your back was negative.  Return for any new or worsening symptoms.
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
## 2019-02-13 NOTE — ED Provider Notes (Signed)
[facility] DEPT Provider Note   CSN: [id number] Arrival date & time: 02/13/19  1715  History   Chief Complaint Chief Complaint  Patient presents with  . Alcohol Intoxication  . Detox    HPI [name] [name] is a 58 y.o. male with past medical history significant for diabetes, hepatitis C, hiatal hernia, Barrett's esophagus, chronic alcohol use, cirrhosis, tobacco abuse disorder who presents for evaluation of requesting detox.  Patient states has history of chronic alcohol use.  States he drinks having "tall boys" a day.  Patient states 2 months ago he had recently cut down on his alcohol was only drinking 1 or 2 beers a day.  Patient states since [place] hit he lost his job and he has not been able to attend AA meetings.  States he has had increased alcohol use since then.  Patient states he did go to his first [facility] meeting today and they told him he should seek inpatient help for alcohol detox.  He denies previous history of DTs or withdrawal seizures.  Patient states he is nauseous however states this frequently happens when he drinks alcohol.  Last drink just PTA.  States he is also had lower back pain however states is chronic in nature is been present for years.  Denies history of IV drug use, bowel or bladder incontinence, saddle paresthesia, chronic steroids or malignancy.  Been ambulatory at home without difficulty.  States he did hit his elbow on a wall approximately 1 week ago and suffered an abrasion to his left elbow.  Denies hitting his head or LOC.  He states he has no pain to his left elbow.  He has been tolerating p.o. at home without difficulty.  He denies fever, chills, nausea, vomiting, headache, neck pain, neck stiffness, chest pain, shortness of breath, abdominal pain, diarrhea dysuria.  He denies any rashes or lesions.  Denies SI, HI, AVH.  History obtained from patient and past medical records.  No interpreter was used.    HPI  Past Medical  History:  Diagnosis Date  . Barrett's esophagus dx 2016  . Bronchitis   . Depression   . Diabetes ([place])   . ED (erectile dysfunction)   . GERD (gastroesophageal reflux disease)   . Hepatitis C   . Hiatal hernia 10/2014   3cm  . Neuropathy     Patient Active Problem List   Diagnosis Date Noted  . Alcohol abuse with alcohol-induced mood disorder ([place]) 12/17/2017  . Hereditary hemochromatosis ([place]) 11/23/2017  . 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 Essential hypertension 04/02/2017  . Hypogonadism male 08/13/2016  . ETOH abuse 06/22/2016  . Low serum testosterone level 01/08/2016  . Uncontrolled type 2 diabetes mellitus without complication, without long-term current use of insulin ([place]) 01/02/2016  . Erectile dysfunction 09/12/2015  . Hepatic cirrhosis ([place]) 01/10/2015  . Chronic hepatitis C without hepatic coma ([place]) 10/31/2014  . Tobacco use disorder 08/03/2013    Past Surgical History:  Procedure Laterality Date  . APPENDECTOMY    . COLONOSCOPY N/A 02/16/2014   Procedure: COLONOSCOPY;  Surgeon: Gatha Mayer, MD;  Location: [facility];  Service: Endoscopy;  Laterality: N/A;  . COLONOSCOPY    . TONSILLECTOMY          Home Medications    Prior to Admission medications   Medication Sig Start Date End Date Taking? Authorizing Provider  albuterol (VENTOLIN HFA) 108 (90 Base) MCG/ACT inhaler Inhale 2 puffs into the lungs every 6 (  six) hours as needed for wheezing or shortness of breath. 09/06/18  Yes [name], NP  amLODipine (NORVASC) 10 MG tablet Take 1 tablet (10 mg total) by mouth daily. 12/06/18 03/06/19 Yes [name], NP  aspirin EC 81 MG tablet Take 1 tablet (81 mg total) by mouth daily. 04/02/17  Yes Funches, Josalyn, MD  atorvastatin (LIPITOR) 40 MG tablet Take 1 tablet (40 mg total) by mouth daily. 06/20/18  Yes [name], NP  canagliflozin [facility]) 100 MG TABS tablet Take 1 tablet (100 mg total) by mouth daily before  breakfast. 12/06/18 03/06/19 Yes [name], NP  citalopram (CELEXA) 40 MG tablet Take 40 mg by mouth daily.   Yes [provider]  losartan (COZAAR) 100 MG tablet Take 1 tablet (100 mg total) by mouth daily. 06/20/18  Yes [name], NP  Multiple Vitamins-Minerals (MULTIVITAMIN ADULT) TABS Take 1 tablet by mouth daily. 04/02/17  Yes Funches, Josalyn, MD  omeprazole (PRILOSEC) 40 MG capsule Take 1 capsule (40 mg total) by mouth daily. 06/20/18 02/13/19 Yes [name], NP  ondansetron (ZOFRAN) 4 MG tablet TAKE 1 TABLET BY MOUTH EVERY 8 HOURS AS NEEDED FOR NAUSEA OR VOMITING. Patient taking differently: Take 4 mg by mouth every 8 (eight) hours as needed for nausea or vomiting.  06/01/18  Yes [name], NP  PARoxetine (PAXIL) 10 MG tablet Take 1 tablet (10 mg total) by mouth daily. 06/20/18  Yes [name], NP  triamcinolone (KENALOG) 0.025 % cream Apply 1 application topically 2 (two) times daily. Patient taking differently: Apply 1 application topically 2 (two) times daily as needed (irritation).  09/06/18  Yes [name], NP  chlordiazePOXIDE (LIBRIUM) 25 MG capsule 50mg  PO TID x 1D, then 25-50mg  PO BID X 1D, then 25-50mg  PO QD X 1D 02/13/19   Michele Kerlin A, PA-C  glipiZIDE (GLUCOTROL) 5 MG tablet Take 1 tablet (5 mg total) by mouth daily before breakfast. Patient not taking: Reported on 02/13/2019 12/06/18 03/06/19  [name], NP  loperamide (IMODIUM A-D) 2 MG tablet Take 1 tablet (2 mg total) by mouth 4 (four) times daily as needed for diarrhea or loose stools. Patient not taking: Reported on 02/13/2019 04/18/18   [name], NP  ondansetron (ZOFRAN ODT) 4 MG disintegrating tablet Take 1 tablet (4 mg total) by mouth every 8 (eight) hours as needed for nausea or vomiting. 02/13/19   Sanda Dejoy A, PA-C  pregabalin (LYRICA) 100 MG capsule Take 1 capsule (100 mg total) by mouth 2 (two) times daily. Patient not taking: Reported on 02/13/2019 06/20/18    [name], NP  TRUEPLUS LANCETS 28G MISC 1 each by Does not apply route 3 (three) times daily. 02/18/16   Boykin Nearing, MD    Family History Family History  Problem Relation Age of Onset  . Cancer Mother        breast  . Diabetes Mother   . Heart disease Maternal Grandfather   . Pancreatic cancer Paternal Grandmother   . Stroke Father   . Alcohol abuse Father   . Pancreatic cancer Father   . Colon cancer Neg Hx   . Stomach cancer Neg Hx   . Rectal cancer Neg Hx   . Esophageal cancer Neg Hx     Social History Social History   Tobacco Use  . Smoking status: Current Every Day Smoker    Packs/day: 0.50    Years: 35.00    Pack  years: 17.50    Types: Cigarettes  . Smokeless tobacco: Never Used  Substance Use Topics  . Alcohol use: Yes    Alcohol/week: 27.0 standard drinks    Types: 6 Cans of beer, 21 Standard drinks or equivalent per week    Comment: 6 beers a week   . Drug use: No     Allergies   Bee venom and Lactose intolerance (gi)   Review of Systems Review of Systems  Constitutional: Negative.   HENT: Negative.   Respiratory: Negative.   Cardiovascular: Negative.   Gastrointestinal: Negative.   Genitourinary: Negative.   Musculoskeletal: Positive for back pain. Negative for arthralgias, gait problem, joint swelling, myalgias, neck pain and neck stiffness.  Skin: Negative.   Neurological: Negative.   Psychiatric/Behavioral: Negative.   All other systems reviewed and are negative.   Physical Exam Updated Vital Signs BP (!) 165/95 (BP Location: Right Arm)   Pulse (!) 105   Temp 98.8 F (37.1 C) (Oral)   Resp 17   SpO2 96%   Physical Exam Vitals signs and nursing note reviewed.  Constitutional:      General: He is not in acute distress.    Appearance: He is well-developed. He is not ill-appearing, toxic-appearing or diaphoretic.  HENT:     Head: Normocephalic and atraumatic.     Nose: Nose normal.     Mouth/Throat:     Mouth: Mucous  membranes are moist.     Pharynx: Oropharynx is clear.  Eyes:     Pupils: Pupils are equal, round, and reactive to light.  Neck:     Musculoskeletal: Normal range of motion and neck supple.  Cardiovascular:     Rate and Rhythm: Normal rate and regular rhythm.     Pulses: Normal pulses.     Heart sounds: Normal heart sounds. No murmur. No friction rub. No gallop.   Pulmonary:     Effort: Pulmonary effort is normal. No respiratory distress.     Breath sounds: Normal breath sounds. No stridor. No wheezing, rhonchi or rales.  Chest:     Chest wall: No tenderness.  Abdominal:     General: Bowel sounds are normal. There is no distension.     Palpations: Abdomen is soft. There is no mass.     Tenderness: There is no abdominal tenderness. There is no right CVA tenderness, left CVA tenderness or guarding.     Hernia: No hernia is present.  Musculoskeletal: Normal range of motion.     Comments: Full range of motion of the T-spine and L-spine with flexion, hyperextension, and lateral flexion. No midline tenderness or stepoffs. No tenderness to palpation of the spinous processes of the T-spine or L-spine. No tenderness to palpation of the paraspinous muscles of the L-spine. Negative straight leg raise. Moves all 4 extremities without difficulty flexion and extension.  No bony tenderness to left radius, ulna, olecranon or humerus.  Patient able to pronate, supinate, flex, extend at left elbow.  No obvious deformity or injury.  No swelling or joint effusion.  Skin:    General: Skin is warm and dry.     Comments: 5 mm well-healing scab to left olecranon.  No surrounding erythema, warmth.  No drainage or bleeding.  Brisk capillary refill.  Neurological:     General: No focal deficit present.     Mental Status: He is alert.     Cranial Nerves: Cranial nerves are intact.     Motor: Motor function is intact.  Gait: Gait is intact.     Comments: Speech is clear and goal oriented, follows commands  Normal 5/5 strength in upper and lower extremities bilaterally including dorsiflexion and plantar flexion, strong and equal grip strength Sensation normal to light and sharp touch Moves extremities without ataxia, coordination intact Normal gait Normal balance No Clonus  Psychiatric:        Attention and Perception: Attention normal.        Mood and Affect: Mood and affect normal.        Speech: Speech normal.        Behavior: Behavior normal. Behavior is cooperative.        Thought Content: Thought content normal.     Comments: Denies SI/ HI/ AVH    ED Treatments / Results  Labs (all labs ordered are listed, but only abnormal results are displayed) Labs Reviewed  COMPREHENSIVE METABOLIC PANEL - Abnormal; Notable for the following components:      Result Value   Glucose, Bld 210 (*)    BUN 5 (*)    Creatinine, Ser 0.55 (*)    Total Protein 8.9 (*)    Albumin 5.1 (*)    AST 194 (*)    ALT 213 (*)    All other components within normal limits  ETHANOL - Abnormal; Notable for the following components:   Alcohol, Ethyl (B) 351 (*)    All other components within normal limits  CBC  RAPID URINE DRUG SCREEN, HOSP PERFORMED    EKG None  Radiology Dg Lumbar Spine Complete  Result Date: 02/13/2019 CLINICAL DATA:  Chronic back pain EXAM: LUMBAR SPINE - COMPLETE 4+ VIEW COMPARISON:  None. FINDINGS: There is no evidence of lumbar spine fracture. Grade 1 anterolisthesis at L4-5. Intervertebral disc spaces are maintained. IMPRESSION: Grade 1 L4-5 anterolisthesis, likely due to facet arthrosis. No acute abnormality. Electronically Signed   By: Ulyses Jarred M.D.   On: 02/13/2019 22:26    Procedures Procedures (including critical care time)  Medications Ordered in ED Medications  ondansetron (ZOFRAN-ODT) disintegrating tablet 4 mg (4 mg Oral Given 02/13/19 2250)     Initial Impression / Assessment and Plan / ED Course  I have reviewed the triage vital signs and the nursing notes.   Pertinent labs & imaging results that were available during my care of the patient were reviewed by me and considered in my medical decision making (see chart for details).  58 year old male appears otherwise well presents for evaluation of requesting alcohol detox.  Afebrile, nonseptic, non-ill-appearing.  Patient ambulatory in ED without difficulty.  He does smell of alcohol however he appears clinically sober as he is ambulatory and tolerating p.o. intake without difficulty.  Denies history of DTs or withdrawal seizures.  Recent increase in alcohol use secondary to COVID.  He denies depression symptoms, anxiety, SI, HI, AVH.  Patient states he is just here to get resources for inpatient treatment for his alcohol.  States he does have back pain however this is chronic in nature.  Plain film negative for acute findings.  He has a normal musculoskeletal exam.  He is neurovascularly intact.  He has no red flags for back pain.  I have low suspicion for cauda equina, discitis, osteomyelitis, transverse myelitis, psoas abscess.  Labs obtained from triage.  Labs personally reviewed CBC without leukocytosis, Matabolic panel with mild hyperglycemia at 210, consistent with his diabetes, elevation in AST, ALT-- chronically elevated, likely due to his alcohol use.  Denies any abdominal pain to suggest  gallstone or duct pathology.  UDS negative.  Alcohol 351, consistent with patient's recent alcohol use.  DG Lumbar spine with chronic changes.  2300: On reevaluation patient sleeping soundly.  He has been provided meal.  Ambulatory in ED without difficulty.  He continues to deny SI, HI, AVH.  On reevaluation patient has not any tachypnea, tachycardia or hypoxia.  He denies any tremors.  He does not appear in active alcohol withdrawal.  0000: On reassessed patient and he is once again sleeping soundly.  States he is nauseous.  Nursing witnessed patient sticking his fingers down his throat in the bathroom to cause  himself to have emesis.  Nursing notes nonbloody, nonbilious emesis.  Patient states he currently gets this way after he drinks alcohol.  Will provide Zofran.  Discussed with patient outpatient resources for alcohol detox.  He does not appear in alcohol withdrawal on reassessment.  Will DC home with Librium taper.  He continues to deny SI, HI, AVH prior to DC.  Patient has been emergency department for 6 hours.  He appears clinically sober at this time.  Will DC home with outpatient resources.  Prior to DC patient requesting 15 mg tablets of Ambien to "help me sleep."  Patient states he has been getting these from a "doc."  Discussed with patient he will need to follow-up with the provider who writes these prescriptions as I do not feel comfortable writing 15 mg tablets of Ambien given his excessive alcohol use when he has been sleeping soundly on both reevaluation's. I have low suspicion that patient is currently having a difficulty sleeping where he would require 15 mg of Ambien.  The patient has been appropriately medically screened and/or stabilized in the ED. I have low suspicion for any other emergent medical condition which would require further screening, evaluation or treatment in the ED or require inpatient management.  Patient is hemodynamically stable and in no acute distress.  Patient able to ambulate in department prior to ED.  Evaluation does not show acute pathology that would require ongoing or additional emergent interventions while in the emergency department or further inpatient treatment.  I have discussed the diagnosis with the patient and answered all questions.  Patient has no further complaints prior to discharge.  Patient is comfortable with plan discussed in room and is stable for discharge at this time.  I have discussed strict return precautions for returning to the emergency department.  Patient was encouraged to follow-up with PCP/specialist refer to at discharge.      Final  Clinical Impressions(s) / ED Diagnoses   Final diagnoses:  Alcoholic intoxication without complication ([place])  Lumbar back pain    ED Discharge Orders         Ordered    chlordiazePOXIDE (LIBRIUM) 25 MG capsule     02/13/19 2312    ondansetron (ZOFRAN ODT) 4 MG disintegrating tablet  Every 8 hours PRN,   Status:  Discontinued     02/13/19 2312    ondansetron (ZOFRAN ODT) 4 MG disintegrating tablet  Every 8 hours PRN     02/13/19 2331           Kristalyn Bergstresser A, PA-C 02/14/19 0044    Dorie Rank, MD 02/14/19 [phone]

## 2019-02-13 NOTE — ED Notes (Signed)
Date and time results received: 02/13/19 9:23 PM  (use smartphrase ".now" to insert current time)  Test: ETOH Critical Value: 351  Name of Provider Notified: Dr.Knapp Orders Received? Or Actions Taken?:none

## 2019-02-13 NOTE — ED Notes (Signed)
Pt alert and oriented. Pt c/o chronic back pain. Pt reports he does not take anything at home for back pain. Pt calm and cooperative. Pt reports he's here because he just needs to speak with someone about his sobriety.Pt safe will continue to monitor.

## 2019-02-13 NOTE — ED Notes (Signed)
Pt changed into scrubs and given a meal tray.  Pt is calm and cooperative.  Denies S/I and H/I.

## 2019-02-14 MED FILL — ONDANSETRON ODT 4 MG TABLET: 4 | 10 days supply | Qty: 20 | Fill #0

## 2019-02-14 NOTE — ED Notes (Signed)
Pt d.c instructions reviewed with patient. Pt verbalizes an understanding of instructions. Pt d.c home, pt escorted to the main ed.

## 2019-02-28 NOTE — Progress Notes (Signed)
Patient ID: [name] [name], male   DOB: May 07, 1961, 58 y.o.   MRN: [id number]    Virtual Visit via Telephone Note  I connected with [name] [name] on 03/01/19 at  2:50 PM EDT by telephone and verified that I am speaking with the correct person using two identifiers.   I discussed the limitations, risks, security and privacy concerns of performing an evaluation and management service by telephone and the availability of in person appointments. I also discussed with the patient that there may be a patient responsible charge related to this service. The patient expressed understanding and agreed to proceed.  Patient location:  home My Location:  [facility] office Persons on the call:  Me and the patient  History of Present Illness: After 02/13/2019 ED visit for alcohol withdrawal.  He is doing well.  Not drinking.  Attending AA meetings daily.  Needs RF on all meds.  No abdominal pain/N/V.  blood sugar yesterday 157  From ED note: HPI [name] [name] is a 58 y.o. male with past medical history significant for diabetes, hepatitis C, hiatal hernia, Barrett's esophagus, chronic alcohol use, cirrhosis, tobacco abuse disorder who presents for evaluation of requesting detox.  Patient states has history of chronic alcohol use.  States he drinks having "tall boys" a day.  Patient states 2 months ago he had recently cut down on his alcohol was only drinking 1 or 2 beers a day.  Patient states since [place] hit he lost his job and he has not been able to attend AA meetings.  States he has had increased alcohol use since then.  Patient states he did go to his first [facility] meeting today and they told him he should seek inpatient help for alcohol detox.  He denies previous history of DTs or withdrawal seizures.  Patient states he is nauseous however states this frequently happens when he drinks alcohol.  Last drink just PTA.  States he is also had lower back pain however states is chronic in nature is been present for years.   Denies history of IV drug use, bowel or bladder incontinence, saddle paresthesia, chronic steroids or malignancy.  Been ambulatory at home without difficulty.  States he did hit his elbow on a wall approximately 1 week ago and suffered an abrasion to his left elbow.  Denies hitting his head or LOC.  He states he has no pain to his left elbow.  He has been tolerating p.o. at home without difficulty.  He denies fever, chills, nausea, vomiting, headache, neck pain, neck stiffness, chest pain, shortness of breath, abdominal pain, diarrhea dysuria.  He denies any rashes or lesions.  Denies SI, HI, AVH.  From ED A/P: 58 year old male appears otherwise well presents for evaluation of requesting alcohol detox.  Afebrile, nonseptic, non-ill-appearing.  Patient ambulatory in ED without difficulty.  He does smell of alcohol however he appears clinically sober as he is ambulatory and tolerating p.o. intake without difficulty.  Denies history of DTs or withdrawal seizures.  Recent increase in alcohol use secondary to COVID.  He denies depression symptoms, anxiety, SI, HI, AVH.  Patient states he is just here to get resources for inpatient treatment for his alcohol.  States he does have back pain however this is chronic in nature.  Plain film negative for acute findings.  He has a normal musculoskeletal exam.  He is neurovascularly intact.  He has no red flags for back pain.  I have low suspicion for cauda equina, discitis, osteomyelitis, transverse  myelitis, psoas abscess.  Labs obtained from triage.  Labs personally reviewed CBC without leukocytosis, Matabolic panel with mild hyperglycemia at 210, consistent with his diabetes, elevation in AST, ALT-- chronically elevated, likely due to his alcohol use.  Denies any abdominal pain to suggest gallstone or duct pathology.  UDS negative.  Alcohol 351, consistent with patient's recent alcohol use.  DG Lumbar spine with chronic changes.  2300: On reevaluation patient  sleeping soundly.  He has been provided meal.  Ambulatory in ED without difficulty.  He continues to deny SI, HI, AVH.  On reevaluation patient has not any tachypnea, tachycardia or hypoxia.  He denies any tremors.  He does not appear in active alcohol withdrawal.  0000: On reassessed patient and he is once again sleeping soundly.  States he is nauseous.  Nursing witnessed patient sticking his fingers down his throat in the bathroom to cause himself to have emesis.  Nursing notes nonbloody, nonbilious emesis.  Patient states he currently gets this way after he drinks alcohol.  Will provide Zofran.  Discussed with patient outpatient resources for alcohol detox.  He does not appear in alcohol withdrawal on reassessment.  Will DC home with Librium taper.  He continues to deny SI, HI, AVH prior to DC.  Patient has been emergency department for 6 hours.  He appears clinically sober at this time.  Will DC home with outpatient resources.  Prior to DC patient requesting 15 mg tablets of Ambien to "help me sleep."  Patient states he has been getting these from a "doc."  Discussed with patient he will need to follow-up with the provider who writes these prescriptions as I do not feel comfortable writing 15 mg tablets of Ambien given his excessive alcohol use when he has been sleeping soundly on both reevaluation's. I have low suspicion that patient is currently having a difficulty sleeping where he would require 15 mg of Ambien.  The patient has been appropriately medically screened and/or stabilized in the ED. I have low suspicion for any other emergent medical condition which would require further screening, evaluation or treatment in the ED or require inpatient management.  Patient is hemodynamically stable and in no acute distress.  Patient able to ambulate in department prior to ED.  Evaluation does not show acute pathology that would require ongoing or additional emergent interventions while in the emergency  department or further inpatient treatment.  I have discussed the diagnosis with the patient and answered all questions.  Patient has no further complaints prior to discharge.  Patient is comfortable with plan discussed in room and is stable for discharge at this time.  I have discussed strict return precautions for returning to the emergency department.  Patient was encouraged to follow-up with PCP/specialist refer to at discharge.   Observations/Objective: A&Ox3   Assessment and Plan: 1. Tobacco use disorder Smoking cessation reviewed and advised - albuterol (VENTOLIN HFA) 108 (90 Base) MCG/ACT inhaler; Inhale 2 puffs into the lungs every 6 (six) hours as needed for wheezing or shortness of breath.  Dispense: 18 g; Refill: 1  2. Essential hypertension - amLODipine (NORVASC) 10 MG tablet; Take 1 tablet (10 mg total) by mouth daily.  Dispense: 90 tablet; Refill: 0 - losartan (COZAAR) 100 MG tablet; Take 1 tablet (100 mg total) by mouth daily.  Dispense: 90 tablet; Refill: 1  3. Uncontrolled type 2 diabetes mellitus without complication, without long-term current use of insulin (HCC) - canagliflozin (INVOKANA) 100 MG TABS tablet; Take 1 tablet (100 mg  total) by mouth daily before breakfast.  Dispense: 90 tablet; Refill: 1  4. Uncontrolled diabetes mellitus with diabetic neuropathy (HCC) - pregabalin (LYRICA) 100 MG capsule; Take 1 capsule (100 mg total) by mouth 2 (two) times daily.  Dispense: 180 capsule; Refill: 1  5. Panic attacks - PARoxetine (PAXIL) 10 MG tablet; Take 1 tablet (10 mg total) by mouth daily.  Dispense: 90 tablet; Refill: 1  6. Mixed hyperlipidemia - atorvastatin (LIPITOR) 40 MG tablet; Take 1 tablet (40 mg total) by mouth daily.  Dispense: 90 tablet; Refill: 1 Stay abstinent from alcohol!!  Great work   Follow Up Instructions: 6-8 weeks with PCP   I discussed the assessment and treatment plan with the patient. The patient was provided an opportunity to ask questions  and all were answered. The patient agreed with the plan and demonstrated an understanding of the instructions.   The patient was advised to call back or seek an in-person evaluation if the symptoms worsen or if the condition fails to improve as anticipated.  I provided 12 minutes of non-face-to-face time during this encounter.   Freeman Caldron, PA-C

## 2019-03-01 ENCOUNTER — Telehealth (HOSPITAL_BASED_OUTPATIENT_CLINIC_OR_DEPARTMENT_OTHER): Payer: Self-pay | Admitting: Physician Assistant

## 2019-03-01 ENCOUNTER — Other Ambulatory Visit: Payer: Self-pay | Admitting: Nurse Practitioner

## 2019-03-01 DIAGNOSIS — E1165 Type 2 diabetes mellitus with hyperglycemia: Secondary | ICD-10-CM

## 2019-03-01 DIAGNOSIS — I10 Essential (primary) hypertension: Secondary | ICD-10-CM

## 2019-03-01 DIAGNOSIS — Z09 Encounter for follow-up examination after completed treatment for conditions other than malignant neoplasm: Secondary | ICD-10-CM

## 2019-03-01 DIAGNOSIS — E1140 Type 2 diabetes mellitus with diabetic neuropathy, unspecified: Secondary | ICD-10-CM

## 2019-03-01 DIAGNOSIS — E782 Mixed hyperlipidemia: Secondary | ICD-10-CM

## 2019-03-01 DIAGNOSIS — F410 Panic disorder [episodic paroxysmal anxiety] without agoraphobia: Secondary | ICD-10-CM

## 2019-03-01 DIAGNOSIS — F10188 Alcohol abuse with other alcohol-induced disorder: Secondary | ICD-10-CM

## 2019-03-01 DIAGNOSIS — F17210 Nicotine dependence, cigarettes, uncomplicated: Secondary | ICD-10-CM

## 2019-03-01 DIAGNOSIS — A084 Viral intestinal infection, unspecified: Secondary | ICD-10-CM

## 2019-03-01 MED ORDER — PAROXETINE HCL 10 MG PO TABS: 10.0000 mg | ORAL_TABLET | Freq: Every day | ORAL | 1 refills | Status: DC

## 2019-03-01 MED ORDER — ALBUTEROL SULFATE HFA 108 (90 BASE) MCG/ACT IN AERS: 2.0000 | INHALATION_SPRAY | Freq: Four times a day (QID) | RESPIRATORY_TRACT | 1 refills | Status: DC | PRN

## 2019-03-01 MED ORDER — PREGABALIN 100 MG PO CAPS: 100.0000 mg | ORAL_CAPSULE | Freq: Two times a day (BID) | ORAL | 1 refills | Status: DC

## 2019-03-01 MED ORDER — AMLODIPINE BESYLATE 10 MG PO TABS: 10.0000 mg | ORAL_TABLET | Freq: Every day | ORAL | 0 refills | Status: DC

## 2019-03-01 MED ORDER — LOSARTAN POTASSIUM 100 MG PO TABS: 100.0000 mg | ORAL_TABLET | Freq: Every day | ORAL | 1 refills | Status: DC

## 2019-03-01 MED ORDER — LOPERAMIDE HCL 2 MG PO TABS: 2.0000 mg | ORAL_TABLET | Freq: Four times a day (QID) | ORAL | 0 refills | Status: DC | PRN

## 2019-03-01 MED ORDER — MIRTAZAPINE 15 MG PO TABS: 15.0000 mg | ORAL_TABLET | Freq: Every day | ORAL | 3 refills | Status: DC

## 2019-03-01 MED ORDER — CANAGLIFLOZIN 100 MG PO TABS: 100.0000 mg | ORAL_TABLET | Freq: Every day | ORAL | 1 refills | Status: AC

## 2019-03-01 MED ORDER — ATORVASTATIN CALCIUM 40 MG PO TABS: 40.0000 mg | ORAL_TABLET | Freq: Every day | ORAL | 1 refills | Status: DC

## 2019-03-01 MED FILL — PAROXETINE HCL 10 MG TABS: 10 | 30 days supply | Qty: 30 | Fill #0

## 2019-03-01 MED FILL — INVOKANA 100 MG TABLET: 100 | 30 days supply | Qty: 30 | Fill #0

## 2019-03-01 MED FILL — ?ATORVASTATIN 40MG TABLET: 40 | 30 days supply | Qty: 30 | Fill #5

## 2019-03-01 MED FILL — !VENTOLIN HFA INHALER: 108 (90 BAS | 25 days supply | Qty: 18 | Fill #0

## 2019-03-01 MED FILL — ?AMLODIPINE BESYLATE 10 MG: 10 | 30 days supply | Qty: 30 | Fill #0

## 2019-03-01 MED FILL — ?MIRTAZAPINE 15 MG TAB: 15 | 30 days supply | Qty: 30 | Fill #0

## 2019-03-01 MED FILL — LOSARTAN POTASSIUM 100 MG T: 100 | 30 days supply | Qty: 30 | Fill #5

## 2019-03-01 NOTE — Progress Notes (Signed)
Patient verified DOB Patient has taken medication. Patient has eaten today. Patient complains of generalized pain in aches.

## 2019-03-13 ENCOUNTER — Ambulatory Visit: Payer: Self-pay | Admitting: Nurse Practitioner

## 2019-03-14 ENCOUNTER — Encounter: Payer: Self-pay | Admitting: Nurse Practitioner

## 2019-03-14 ENCOUNTER — Emergency Department (HOSPITAL_COMMUNITY)
Admission: EM | Admit: 2019-03-14 | Discharge: 2019-03-14 | Disposition: A | Discharge status: Home / Self Care | Payer: Self-pay | Attending: Emergency Medicine | Admitting: Emergency Medicine

## 2019-03-14 ENCOUNTER — Other Ambulatory Visit: Payer: Self-pay

## 2019-03-14 ENCOUNTER — Encounter (HOSPITAL_COMMUNITY): Payer: Self-pay | Admitting: Radiology

## 2019-03-14 ENCOUNTER — Ambulatory Visit: Payer: Self-pay | Attending: Nurse Practitioner | Admitting: Nurse Practitioner

## 2019-03-14 ENCOUNTER — Emergency Department (HOSPITAL_COMMUNITY): Payer: Self-pay

## 2019-03-14 VITALS — BP 129/70 | HR 104 | Temp 99.6°F | Ht 71.0 in | Wt 166.0 lb

## 2019-03-14 DIAGNOSIS — Y9389 Activity, other specified: Secondary | ICD-10-CM | POA: Insufficient documentation

## 2019-03-14 DIAGNOSIS — Y999 Unspecified external cause status: Secondary | ICD-10-CM | POA: Insufficient documentation

## 2019-03-14 DIAGNOSIS — R55 Syncope and collapse: Secondary | ICD-10-CM | POA: Insufficient documentation

## 2019-03-14 DIAGNOSIS — Z09 Encounter for follow-up examination after completed treatment for conditions other than malignant neoplasm: Secondary | ICD-10-CM

## 2019-03-14 DIAGNOSIS — S0285XA Fracture of orbit, unspecified, initial encounter for closed fracture: Secondary | ICD-10-CM

## 2019-03-14 DIAGNOSIS — F17210 Nicotine dependence, cigarettes, uncomplicated: Secondary | ICD-10-CM | POA: Insufficient documentation

## 2019-03-14 DIAGNOSIS — E119 Type 2 diabetes mellitus without complications: Secondary | ICD-10-CM | POA: Insufficient documentation

## 2019-03-14 DIAGNOSIS — IMO0001 Reserved for inherently not codable concepts without codable children: Secondary | ICD-10-CM

## 2019-03-14 DIAGNOSIS — W1839XA Other fall on same level, initial encounter: Secondary | ICD-10-CM | POA: Insufficient documentation

## 2019-03-14 DIAGNOSIS — R21 Rash and other nonspecific skin eruption: Secondary | ICD-10-CM

## 2019-03-14 DIAGNOSIS — S01112A Laceration without foreign body of left eyelid and periocular area, initial encounter: Secondary | ICD-10-CM | POA: Insufficient documentation

## 2019-03-14 DIAGNOSIS — I10 Essential (primary) hypertension: Secondary | ICD-10-CM | POA: Insufficient documentation

## 2019-03-14 DIAGNOSIS — Z7982 Long term (current) use of aspirin: Secondary | ICD-10-CM | POA: Insufficient documentation

## 2019-03-14 DIAGNOSIS — Z79899 Other long term (current) drug therapy: Secondary | ICD-10-CM | POA: Insufficient documentation

## 2019-03-14 DIAGNOSIS — Y92524 Gas station as the place of occurrence of the external cause: Secondary | ICD-10-CM | POA: Insufficient documentation

## 2019-03-14 DIAGNOSIS — F1010 Alcohol abuse, uncomplicated: Secondary | ICD-10-CM

## 2019-03-14 DIAGNOSIS — S0181XA Laceration without foreign body of other part of head, initial encounter: Secondary | ICD-10-CM

## 2019-03-14 DIAGNOSIS — E1165 Type 2 diabetes mellitus with hyperglycemia: Secondary | ICD-10-CM

## 2019-03-14 LAB — CBC WITH DIFFERENTIAL/PLATELET
Abs Immature Granulocytes: 0.02 10*3/uL (ref 0.00–0.07)
Basophils Absolute: 0.1 10*3/uL (ref 0.0–0.1)
Basophils Relative: 1 %
Eosinophils Absolute: 0.3 10*3/uL (ref 0.0–0.5)
Eosinophils Relative: 4 %
HCT: 42.9 % (ref 39.0–52.0)
Hemoglobin: 14.5 g/dL (ref 13.0–17.0)
Immature Granulocytes: 0 %
Lymphocytes Relative: 37 %
Lymphs Abs: 2.6 10*3/uL (ref 0.7–4.0)
MCH: 33.4 pg (ref 26.0–34.0)
MCHC: 33.8 g/dL (ref 30.0–36.0)
MCV: 98.8 fL (ref 80.0–100.0)
Monocytes Absolute: 0.8 10*3/uL (ref 0.1–1.0)
Monocytes Relative: 11 %
Neutro Abs: 3.3 10*3/uL (ref 1.7–7.7)
Neutrophils Relative %: 47 %
Platelets: 221 10*3/uL (ref 150–400)
RBC: 4.34 MIL/uL (ref 4.22–5.81)
RDW: 12.1 % (ref 11.5–15.5)
WBC: 7.1 10*3/uL (ref 4.0–10.5)
nRBC: 0 % (ref 0.0–0.2)

## 2019-03-14 LAB — COMPREHENSIVE METABOLIC PANEL
ALT: 95 U/L — ABNORMAL HIGH (ref 0–44)
AST: 108 U/L — ABNORMAL HIGH (ref 15–41)
Albumin: 3.4 g/dL — ABNORMAL LOW (ref 3.5–5.0)
Alkaline Phosphatase: 72 U/L (ref 38–126)
Anion gap: 13 (ref 5–15)
BUN: 7 mg/dL (ref 6–20)
CO2: 19 mmol/L — ABNORMAL LOW (ref 22–32)
Calcium: 8.3 mg/dL — ABNORMAL LOW (ref 8.9–10.3)
Chloride: 106 mmol/L (ref 98–111)
Creatinine, Ser: 0.65 mg/dL (ref 0.61–1.24)
GFR calc Af Amer: >60 mL/min (ref >60)
GFR calc non Af Amer: >60 mL/min (ref >60)
Glucose, Bld: 129 mg/dL — ABNORMAL HIGH (ref 70–99)
Potassium: 3.9 mmol/L (ref 3.5–5.1)
Sodium: 138 mmol/L (ref 135–145)
Total Bilirubin: 0.3 mg/dL (ref 0.3–1.2)
Total Protein: 6.9 g/dL (ref 6.5–8.1)

## 2019-03-14 LAB — POCT GLYCOSYLATED HEMOGLOBIN (HGB A1C): Hemoglobin A1C: 7.6 % — AB (ref 4.0–5.6)

## 2019-03-14 LAB — ETHANOL: Alcohol, Ethyl (B): 218 mg/dL — ABNORMAL HIGH (ref <10)

## 2019-03-14 LAB — GLUCOSE, POCT (MANUAL RESULT ENTRY): POC Glucose: 266 mg/dl — AB (ref 70–99)

## 2019-03-14 MED ORDER — LIDOCAINE-EPINEPHRINE-TETRACAINE (LET) SOLUTION
3.0000 mL | Freq: Once | NASAL | Status: AC
  Administered 2019-03-14: 3 mL via TOPICAL
  Filled 2019-03-14: qty 3

## 2019-03-14 MED ORDER — CYCLOBENZAPRINE HCL 5 MG PO TABS: 5.0000 mg | ORAL_TABLET | Freq: Three times a day (TID) | ORAL | 1 refills | Status: DC | PRN

## 2019-03-14 MED ORDER — MUPIROCIN 2 % EX OINT: TOPICAL_OINTMENT | CUTANEOUS | 1 refills | Status: DC

## 2019-03-14 MED ORDER — DULOXETINE HCL 30 MG PO CPEP: 30.0000 mg | ORAL_CAPSULE | Freq: Every day | ORAL | 3 refills | Status: DC

## 2019-03-14 MED ORDER — TRIAMCINOLONE ACETONIDE 0.025 % EX CREA: 1.0000 "application " | TOPICAL_CREAM | Freq: Two times a day (BID) | CUTANEOUS | 0 refills | Status: DC

## 2019-03-14 MED FILL — MUPIROCIN 2% OINTMENT: 2 | 15 days supply | Qty: 44 | Fill #0

## 2019-03-14 MED FILL — TRIAMCINOLONE 0.025% CREAM: 0.025 | 15 days supply | Qty: 30 | Fill #0

## 2019-03-14 MED FILL — CYCLOBENZAPRINE 5 MG TABLET: 5 | 20 days supply | Qty: 60 | Fill #0

## 2019-03-14 MED FILL — ?DULoxetine HCL 30MG CPEP: 30 | 30 days supply | Qty: 30 | Fill #0

## 2019-03-14 NOTE — ED Provider Notes (Signed)
Emergency Department Provider Note   I have reviewed the triage vital signs and the nursing notes.   HISTORY  Chief Complaint Loss of Consciousness and Fall   HPI [name] [name] is a 58 y.o. male here via EMS secondary to syncope.  Patient was witnessed to fall straight on his face at a gas station and patient has no memory of the incident.  He suffered a laceration and black eye to that side.  Patient has no recollection of the incident does know of any surrounding symptoms.   No other associated or modifying symptoms.   Level V Caveat secondary to amnesia  Past Medical History:  Diagnosis Date   Barrett's esophagus dx 2016   Bronchitis    Depression    Diabetes [facility])    ED (erectile dysfunction)    GERD (gastroesophageal reflux disease)    Hepatitis C    Hiatal hernia 10/2014   3cm   Neuropathy     Patient Active Problem List   Diagnosis Date Noted   Alcohol abuse with alcohol-induced mood disorder ([place]) 12/17/2017   Hereditary hemochromatosis ([place]) 11/23/2017   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   Essential hypertension 04/02/2017   Hypogonadism male 08/13/2016   ETOH abuse 06/22/2016   Low serum testosterone level 01/08/2016   Uncontrolled type 2 diabetes mellitus without complication, without long-term current use of insulin ([place]) 01/02/2016   Erectile dysfunction 09/12/2015   Hepatic cirrhosis ([place]) 01/10/2015   Chronic hepatitis C without hepatic coma ([place]) 10/31/2014   Tobacco use disorder 08/03/2013    Past Surgical History:  Procedure Laterality Date   APPENDECTOMY     COLONOSCOPY N/A 02/16/2014   Procedure: COLONOSCOPY;  Surgeon: Gatha Mayer, MD;  Location: [facility];  Service: Endoscopy;  Laterality: N/A;   COLONOSCOPY     TONSILLECTOMY      Current Outpatient Rx   Order #: [id number] Class: Normal   Order #: [id number] Class: Normal   Order #: [id number] Class: Normal   Order #:  [id number] Class: Normal   Order #: [id number] Class: Normal   Order #: [id number] Class: Normal   Order #: [id number] Class: Normal   Order #: [id number] Class: Normal   Order #: [id number] Class: Normal   Order #: [id number] Class: Normal   Order #: [id number] Class: Print   Order #: [id number] Class: Normal   Order #: [id number] Class: Normal   Order #: [id number] Class: Print   Order #: [id number] Class: Normal    Allergies Bee venom and Lactose intolerance (gi)  Family History  Problem Relation Age of Onset   Cancer Mother        breast   Diabetes Mother    Heart disease Maternal Grandfather    Pancreatic cancer Paternal Grandmother    Stroke Father    Alcohol abuse Father    Pancreatic cancer Father    Colon cancer Neg Hx    Stomach cancer Neg Hx    Rectal cancer Neg Hx    Esophageal cancer Neg Hx     Social History Social History   Tobacco Use   Smoking status: Current Every Day Smoker    Packs/day: 0.25    Years: 35.00    Pack years: 8.75    Types: Cigarettes   Smokeless tobacco: Never Used  Substance Use Topics   Alcohol use: Yes    Alcohol/week: 27.0 standard drinks    Types: 6 Cans of beer, 21 Standard drinks or equivalent per week    Comment: 6 beers a week    Drug use:  No    Review of Systems  All other systems negative except as documented in the HPI. All pertinent positives and negatives as reviewed in the HPI. ____________________________________________   PHYSICAL EXAM:  VITAL SIGNS: ED Triage Vitals  Enc Vitals Group     BP 03/14/19 0102 124/79     Pulse Rate 03/14/19 0102 83     Resp 03/14/19 0115 (!) 21     Temp 03/14/19 0102 98.1 F (36.7 C)     Temp Source 03/14/19 0102 Oral     SpO2 03/14/19 0102 95 %    Constitutional: Alert and oriented. Well appearing and in no acute distress. Eyes: Conjunctivae are normal. PERRL. EOMI. normal extraocular movements.  Normal pupillary dilation.  Mild injection of left eye. Head:  Laceration left of his eye, ecchymosis around his left eye.. Nose: No congestion/rhinnorhea. Mouth/Throat: Mucous membranes are moist.  Oropharynx non-erythematous. Neck: No stridor.  No meningeal signs.   Cardiovascular: Normal rate, regular rhythm. Good peripheral circulation. Grossly normal heart sounds.   Respiratory: Normal respiratory effort.  No retractions. Lungs CTAB. Gastrointestinal: Soft and nontender. No distention.  Musculoskeletal: No lower extremity tenderness nor edema. No gross deformities of extremities. Neurologic:  Normal speech and language. No gross focal neurologic deficits are appreciated.  Skin:  Skin is warm, dry and intact. No rash noted.   ____________________________________________   LABS (all labs ordered are listed, but only abnormal results are displayed)  Labs Reviewed  COMPREHENSIVE METABOLIC PANEL - Abnormal; Notable for the following components:      Result Value   CO2 19 (*)    Glucose, Bld 129 (*)    Calcium 8.3 (*)    Albumin 3.4 (*)    AST 108 (*)    ALT 95 (*)    All other components within normal limits  ETHANOL - Abnormal; Notable for the following components:   Alcohol, Ethyl (B) 218 (*)    All other components within normal limits  CBC WITH DIFFERENTIAL/PLATELET   ____________________________________________  EKG  My ECG Read Indication:syncope EKG was personally contemporaneously reviewed by myself. Rate: 82 PR Interval: 180 QRS duration: 86 QT/QTC: 383/448 Axis: normal EKG: normal EKG, normal sinus rhythm, unchanged from previous tracings. Other significant findings: no significnat change  ____________________________________________  RADIOLOGY  Ct Head Wo Contrast  Result Date: 03/14/2019 CLINICAL DATA:  58 y/o M; witnessed syncope/fall. Laceration to left-sided face in the periorbital region. EXAM: CT HEAD WITHOUT CONTRAST CT MAXILLOFACIAL WITHOUT CONTRAST TECHNIQUE: Multidetector CT imaging of the head and  maxillofacial structures were performed using the standard protocol without intravenous contrast. Multiplanar CT image reconstructions of the maxillofacial structures were also generated. COMPARISON:  None. FINDINGS: CT HEAD FINDINGS Brain: No evidence of acute infarction, hemorrhage, hydrocephalus, extra-axial collection or mass lesion/mass effect. Vascular: No hyperdense vessel or unexpected calcification. Skull: Normal. Negative for fracture or focal lesion. Other: None. CT MAXILLOFACIAL FINDINGS Osseous: As below. Orbits: Left floor of orbit fracture measuring 25 x 14 mm (AP by ML series 13, image 35 and series 14, image 59) with 8 mm inferior displacement into the maxillary sinus. Extraconal hemorrhage medially and inferiorly. No extraocular muscle entrapment. Mild left proptosis. Normal right orbit. Sinuses: Left maxillary hemosinus. Debris within left ethmoid air cells, left nasal passages, extending into nasopharynx. Partial opacification of left mastoid air cells. Soft tissues: Superficial contusion of left face and inferior periorbital soft tissues. IMPRESSION: CT head: No acute intracranial abnormality or calvarial fracture. CT maxillofacial: 1. Left floor of orbit fracture  with 8 mm inferior displacement into the maxillary sinus. Extraconal hemorrhage medially and inferiorly. Mild left proptosis. No extraocular muscle entrapment. 2. Superficial contusion of left face and inferior periorbital soft tissues. 3. Left maxillary hemosinus. Debris within left ethmoid air cells, left nasal passages, extending into nasopharynx, likely blood products. Electronically Signed   By: Kristine Garbe M.D.   On: 03/14/2019 02:29   Dg Chest Portable 1 View  Result Date: 03/14/2019 CLINICAL DATA:  Syncope EXAM: PORTABLE CHEST 1 VIEW COMPARISON:  10/17/2017 CT, radiograph 10/17/2017 FINDINGS: Minimal scarring or atelectasis at the left base. No focal consolidation or effusion. Normal heart size. No  pneumothorax. IMPRESSION: No active disease.  Minimal scarring or atelectasis at the left base Electronically Signed   By: Donavan Foil M.D.   On: 03/14/2019 02:01   Ct Maxillofacial Wo Contrast  Result Date: 03/14/2019 CLINICAL DATA:  58 y/o M; witnessed syncope/fall. Laceration to left-sided face in the periorbital region. EXAM: CT HEAD WITHOUT CONTRAST CT MAXILLOFACIAL WITHOUT CONTRAST TECHNIQUE: Multidetector CT imaging of the head and maxillofacial structures were performed using the standard protocol without intravenous contrast. Multiplanar CT image reconstructions of the maxillofacial structures were also generated. COMPARISON:  None. FINDINGS: CT HEAD FINDINGS Brain: No evidence of acute infarction, hemorrhage, hydrocephalus, extra-axial collection or mass lesion/mass effect. Vascular: No hyperdense vessel or unexpected calcification. Skull: Normal. Negative for fracture or focal lesion. Other: None. CT MAXILLOFACIAL FINDINGS Osseous: As below. Orbits: Left floor of orbit fracture measuring 25 x 14 mm (AP by ML series 13, image 35 and series 14, image 59) with 8 mm inferior displacement into the maxillary sinus. Extraconal hemorrhage medially and inferiorly. No extraocular muscle entrapment. Mild left proptosis. Normal right orbit. Sinuses: Left maxillary hemosinus. Debris within left ethmoid air cells, left nasal passages, extending into nasopharynx. Partial opacification of left mastoid air cells. Soft tissues: Superficial contusion of left face and inferior periorbital soft tissues. IMPRESSION: CT head: No acute intracranial abnormality or calvarial fracture. CT maxillofacial: 1. Left floor of orbit fracture with 8 mm inferior displacement into the maxillary sinus. Extraconal hemorrhage medially and inferiorly. Mild left proptosis. No extraocular muscle entrapment. 2. Superficial contusion of left face and inferior periorbital soft tissues. 3. Left maxillary hemosinus. Debris within left ethmoid air  cells, left nasal passages, extending into nasopharynx, likely blood products. Electronically Signed   By: Kristine Garbe M.D.   On: 03/14/2019 02:29    ____________________________________________   PROCEDURES  Procedure(s) performed:   Marland KitchenMarland KitchenLaceration Repair  Date/Time: 03/14/2019 6:33 AM Performed by: Merrily Pew, MD Authorized by: Merrily Pew, MD   Consent:    Consent obtained:  Verbal   Consent given by:  Patient   Risks discussed:  Infection, need for additional repair, nerve damage, poor wound healing, poor cosmetic result, pain, retained foreign body, tendon damage and vascular damage   Alternatives discussed:  No treatment, delayed treatment and observation Anesthesia (see MAR for exact dosages):    Anesthesia method:  Topical application   Topical anesthetic:  LET Laceration details:    Length (cm):  2   Depth (mm):  3 Repair type:    Repair type:  Intermediate Pre-procedure details:    Preparation:  Patient was prepped and draped in usual sterile fashion and imaging obtained to evaluate for foreign bodies Exploration:    Wound exploration: wound explored through full range of motion and entire depth of wound probed and visualized   Treatment:    Area cleansed with:  Saline   Amount  of cleaning:  Extensive   Irrigation solution:  Sterile water   Irrigation volume:  50   Irrigation method:  Syringe   Visualized foreign bodies/material removed: no   Skin repair:    Repair method:  Sutures   Suture size:  4-0   Wound skin closure material used: vicryl rapide.   Suture technique:  Simple interrupted   Number of sutures:  5 Approximation:    Approximation:  Close Post-procedure details:    Dressing:  Antibiotic ointment   Patient tolerance of procedure:  Tolerated well, no immediate complications     ____________________________________________   INITIAL IMPRESSION / ASSESSMENT AND PLAN / ED COURSE  Patient found to have orbital floor  fracture but no evidence of entrapment.  Also with mild proptosis of the same eye.  Discussed this with ENT, Dr. Redmond Baseman he will follow-up in the office as there is no evidence of entrapment or open globe at this time no indication to consult ophthalmology.  These precautions were given to the patient and printed on his paperwork.  Lacerations repaired with absorbable suture as per procedure note.  As far as the fall syncope work-up was unremarkable but his alcohol was still pretty elevated.  Possibly fell because of the alcohol and just has amnesia to the event.  Difficult to tell at this time.  We will follow-up with his primary care doctor for the same.  His mother is coming to pick him up and will observe him for now.    Pertinent labs & imaging results that were available during my care of the patient were reviewed by me and considered in my medical decision making (see chart for details).  A medical screening exam was performed and I feel the patient has had an appropriate workup for their chief complaint at this time and likelihood of emergent condition existing is low. They have been counseled on decision, discharge, follow up and which symptoms necessitate immediate return to the emergency department. They or their family verbally stated understanding and agreement with plan and discharged in stable condition.   ____________________________________________  FINAL CLINICAL IMPRESSION(S) / ED DIAGNOSES  Final diagnoses:  Syncope and collapse  Closed fracture of orbit, initial encounter  Facial laceration, initial encounter     MEDICATIONS GIVEN DURING THIS VISIT:  Medications  lidocaine-EPINEPHrine-tetracaine (LET) solution (3 mLs Topical Given 03/14/19 0354)     NEW OUTPATIENT MEDICATIONS STARTED DURING THIS VISIT:  Discharge Medication List as of 03/14/2019  4:50 AM      Note:  This note was prepared with assistance of Dragon voice recognition software. Occasional wrong-word or  sound-a-like substitutions may have occurred due to the inherent limitations of voice recognition software.   Juanell Saffo, Corene Cornea, MD 03/14/19 [phone]

## 2019-03-14 NOTE — ED Triage Notes (Signed)
Came in via ems; reported witnessed syncope / fall by people nearby at a gas station. +ETOH. Reported on blood thinners. Pt reported on ASA.

## 2019-03-14 NOTE — Progress Notes (Signed)
Assessment & Plan:  [name] [name] was seen today for follow-up.  Diagnoses and all orders for this visit:  Hospital discharge follow-up -     mupirocin ointment (BACTROBAN) 2 %; Apply to affected areas twice per day. Patient will pick up scripts today. -     cyclobenzaprine (FLEXERIL) 5 MG tablet; Take 1 tablet (5 mg total) by mouth 3 (three) times daily as needed for muscle spasms. Patient will pick up scripts today. -     DULoxetine (CYMBALTA) 30 MG capsule; Take 1 capsule (30 mg total) by mouth daily. Patient will pick up scripts today.  Uncontrolled type 2 diabetes mellitus without complication, without long-term current use of insulin (HCC) -     Glucose (CBG) -     HgB A1c Continue blood sugar control as discussed in office today, low carbohydrate diet, and regular physical exercise as tolerated, 150 minutes per week (30 min each day, 5 days per week, or 50 min 3 days per week). Keep blood sugar logs with fasting goal of 90-130 mg/dl, post prandial (after you eat) less than 180.  For Hypoglycemia: BS <60 and Hyperglycemia BS >400; contact the clinic ASAP. Annual eye exams and foot exams are recommended.   Skin rash -     triamcinolone (KENALOG) 0.025 % cream; Apply 1 application topically 2 (two) times daily. Patient will pick up scripts today.   Essential Hypertension Continue all antihypertensives as prescribed.  Remember to bring in your blood pressure log with you for your follow up appointment.  DASH/Mediterranean Diets are healthier choices for HTN.   ETOH abuse Declines substance abuse community resources.   Patient has been counseled on age-appropriate routine health concerns for screening and prevention. These are reviewed and up-to-date. Referrals have been placed accordingly. Immunizations are up-to-date or declined.    Subjective:   Chief Complaint  Patient presents with  . Follow-up    Pt. is here to follow up DM.    [name] [name] 58 y.o. male presents to  office today for ED f/u.  has a past medical history of Barrett's esophagus (dx 2016), Bronchitis, Depression, Diabetes ([place]), ED (erectile dysfunction), GERD (gastroesophageal reflux disease), Hepatitis C, Hiatal hernia (10/2014), and Neuropathy.  HOSPITAL F/U Seen in the ED last night for syncopal episode. Patient was witnessed to fall straight on his face at a gas station and patient has no memory of the incident. He suffered a laceration and black eye to that side.  Patient has no recollection of the incident does know of any surrounding symptoms.  Alcohol, Ethyl (B) 218 (*)  Patient found to have orbital floor fracture but no evidence of entrapment.  Also with mild proptosis of the same eye.  Discussed this with ENT, Dr. Redmond Baseman he will follow-up in the office as there is no evidence of entrapment or open globe at this time no indication to consult ophthalmology.  These precautions were given to the patient and printed on his paperwork.  Lacerations repaired with absorbable suture as per procedure note.  As far as the fall syncope work-up was unremarkable but his alcohol was still pretty elevated.  Possibly fell because of the alcohol and just has amnesia to the event.  Today he states he does not recall the injury or what led up to his injury. Endorses some back pain. Denies any headache.   DM TYPE 2 Not at goal <7. Likely due to increased use of ETOH and some non adherence to medications. Current medications: Invokana  100 mg daily. Denies any hypoglycemic symptoms. Taking ARB and statin. Overdue for eye exam. Patient has been advised to apply for financial assistance and schedule to see our financial counselor.  Lab Results  Component Value Date   HGBA1C 7.6 (A) 03/14/2019   Lab Results  Component Value Date   HGBA1C 7.8 (A) 12/06/2018    Hyperlipidemia Patient presents for follow up to hyperlipidemia.  He is medication compliant taking atorvastatin 40 mg daily. He is not diet compliant  and denies statin intolerance including myalgias. LDL not at goal <70. Lab Results  Component Value Date   CHOL 176 04/18/2018   Lab Results  Component Value Date   HDL 73 04/18/2018   Lab Results  Component Value Date   LDLCALC 90 04/18/2018   Lab Results  Component Value Date   TRIG 65 04/18/2018   Lab Results  Component Value Date   CHOLHDL 2.4 04/18/2018     Essential Hypertension Chronic and well controlled. Taking losartan 100 mg daily and amlodipine 10 mg daily as prescribed. Denies chest pain, shortness of breath, palpitations, lightheadedness, dizziness, headaches or BLE edema. He does not monitor his blood pressure at home.  BP Readings from Last 3 Encounters:  03/14/19 129/70  03/14/19 (!) 145/72  02/13/19 (!) 165/95   Review of Systems  Constitutional: Negative for fever, malaise/fatigue and weight loss.  HENT: Negative.  Negative for nosebleeds.   Eyes: Positive for pain and redness. Negative for blurred vision, double vision and photophobia.  Respiratory: Negative.  Negative for cough and shortness of breath.   Cardiovascular: Negative.  Negative for chest pain, palpitations and leg swelling.  Gastrointestinal: Positive for heartburn. Negative for nausea and vomiting.  Musculoskeletal: Positive for back pain and myalgias.  Neurological: Negative.  Negative for dizziness, focal weakness, seizures and headaches.  Psychiatric/Behavioral: Positive for substance abuse. Negative for suicidal ideas.    Past Medical History:  Diagnosis Date  . Barrett's esophagus dx 2016  . Bronchitis   . Depression   . Diabetes ([place])   . ED (erectile dysfunction)   . GERD (gastroesophageal reflux disease)   . Hepatitis C   . Hiatal hernia 10/2014   3cm  . Neuropathy     Past Surgical History:  Procedure Laterality Date  . APPENDECTOMY    . COLONOSCOPY N/A 02/16/2014   Procedure: COLONOSCOPY;  Surgeon: Gatha Mayer, MD;  Location: [facility];  Service: Endoscopy;   Laterality: N/A;  . COLONOSCOPY    . TONSILLECTOMY      Family History  Problem Relation Age of Onset  . Cancer Mother        breast  . Diabetes Mother   . Heart disease Maternal Grandfather   . Pancreatic cancer Paternal Grandmother   . Stroke Father   . Alcohol abuse Father   . Pancreatic cancer Father   . Colon cancer Neg Hx   . Stomach cancer Neg Hx   . Rectal cancer Neg Hx   . Esophageal cancer Neg Hx     Social History Reviewed with no changes to be made today.   Outpatient Medications Prior to Visit  Medication Sig Dispense Refill  . albuterol (VENTOLIN HFA) 108 (90 Base) MCG/ACT inhaler Inhale 2 puffs into the lungs every 6 (six) hours as needed for wheezing or shortness of breath. 18 g 1  . amLODipine (NORVASC) 10 MG tablet Take 1 tablet (10 mg total) by mouth daily. 90 tablet 0  . aspirin EC 81 MG tablet  Take 1 tablet (81 mg total) by mouth daily. 30 tablet 11  . atorvastatin (LIPITOR) 40 MG tablet Take 1 tablet (40 mg total) by mouth daily. 90 tablet 1  . canagliflozin (INVOKANA) 100 MG TABS tablet Take 1 tablet (100 mg total) by mouth daily before breakfast. 90 tablet 1  . loperamide (IMODIUM A-D) 2 MG tablet Take 1 tablet (2 mg total) by mouth 4 (four) times daily as needed for diarrhea or loose stools. 60 tablet 0  . losartan (COZAAR) 100 MG tablet Take 1 tablet (100 mg total) by mouth daily. 90 tablet 1  . mirtazapine (REMERON) 15 MG tablet Take 1 tablet (15 mg total) by mouth at bedtime. 30 tablet 3  . Multiple Vitamins-Minerals (MULTIVITAMIN ADULT) TABS Take 1 tablet by mouth daily. 30 tablet 11  . omeprazole (PRILOSEC) 40 MG capsule Take 1 capsule (40 mg total) by mouth daily. 90 capsule 1  . ondansetron (ZOFRAN ODT) 4 MG disintegrating tablet Take 1 tablet (4 mg total) by mouth every 8 (eight) hours as needed for nausea or vomiting. 20 tablet 0  . PARoxetine (PAXIL) 10 MG tablet Take 1 tablet (10 mg total) by mouth daily. 90 tablet 1  . TRUEPLUS LANCETS 28G  MISC 1 each by Does not apply route 3 (three) times daily. 100 each 11  . pregabalin (LYRICA) 100 MG capsule Take 1 capsule (100 mg total) by mouth 2 (two) times daily. (Patient not taking: Reported on 03/14/2019) 180 capsule 1  . triamcinolone (KENALOG) 0.025 % cream Apply 1 application topically 2 (two) times daily. (Patient not taking: Reported on 03/14/2019) 30 g 0   No facility-administered medications prior to visit.     Allergies  Allergen Reactions  . Bee Venom Anaphylaxis  . Lactose Intolerance (Gi) Other (See Comments)    GI upset       Objective:    BP 129/70 (BP Location: Left Arm, Patient Position: Sitting, Cuff Size: Normal)   Pulse (!) 104   Temp 99.6 F (37.6 C) (Oral)   Ht 5\' 11"  (1.803 m)   Wt 166 lb (75.3 kg)   SpO2 95%   BMI 23.15 kg/m  Wt Readings from Last 3 Encounters:  03/14/19 166 lb (75.3 kg)  12/06/18 171 lb 6.4 oz (77.7 kg)  09/06/18 177 lb 3.2 oz (80.4 kg)    Physical Exam Vitals signs and nursing note reviewed.  Constitutional:      Appearance: He is well-developed.  HENT:     Head: Normocephalic and atraumatic.  Eyes:     Conjunctiva/sclera:     Left eye: Left conjunctiva is injected.     Comments: Left eye is swollen shut with significant ecchymosis and swelling.   Neck:     Musculoskeletal: Normal range of motion.  Cardiovascular:     Rate and Rhythm: Normal rate and regular rhythm.     Heart sounds: Normal heart sounds. No murmur. No friction rub. No gallop.   Pulmonary:     Effort: Pulmonary effort is normal. No tachypnea or respiratory distress.     Breath sounds: Normal breath sounds. No decreased breath sounds, wheezing, rhonchi or rales.  Chest:     Chest wall: No tenderness.  Abdominal:     General: Bowel sounds are normal.     Palpations: Abdomen is soft.  Musculoskeletal: Normal range of motion.  Skin:    General: Skin is warm and dry.     Findings: Abrasion present.     Comments: Numerous abrasions  on bilateral arms  and legs. There is also an abrasion on the right posterior neck and top of scalp.   Neurological:     Mental Status: He is alert and oriented to person, place, and time.     Coordination: Coordination normal.  Psychiatric:        Behavior: Behavior normal. Behavior is cooperative.        Thought Content: Thought content normal.        Judgment: Judgment normal.        Patient has been counseled extensively about nutrition and exercise as well as the importance of adherence with medications and regular follow-up. The patient was given clear instructions to go to ER or return to medical center if symptoms don't improve, worsen or new problems develop. The patient verbalized understanding.   Follow-up: Return in about 3 months (around 06/14/2019).   [name], FNP-BC [facility] and [place], [place]   03/14/2019, 5:10 PM

## 2019-03-14 NOTE — ED Notes (Signed)
Patient transported to CT 

## 2019-03-14 NOTE — Discharge Instructions (Addendum)
If you start having double vision, difficulty moving her left eye, severe pain behind left eye, change in vision in your eye, syncope, severe nausea or any other new symptoms please return to the emergency department.  Otherwise please call the number for Dr. Redmond Baseman to schedule an appointment in 5 to 7 days.  The fracture itself is usually not treated until all the swelling around the eye has gone away. This may take 1-2 weeks. After your swelling goes away: If you have persistent vertical double vision, an eye specialist (ophthalmologist) may try to free the entrapped tissue. If this is not possible, you may need surgery. If you have double vision only when looking up, your health care provider will discuss treatment options with you. Some people who do not spend a lot of time looking up choose not to have more treatment. Others who need to look up often, such as electricians, need treatment. You may need surgery sooner than later if you have: A trapdoor fracture. Symptoms of a nervous system response (oculocardiac reflex) such as: Nausea or vomiting. A slow heart rate. Dizziness. Loss of consciousness. Follow these instructions at home: Safety Do not drive or perform your regular activities without your health care provider's approval. Be aware that if you are only using one eye to see, you may have difficulty judging distances (depth perception). Eye care Always follow recommendations about wearing protective glasses or goggles. Do not wear contact lenses until your health care provider says it is okay. Managing pain and swelling  If directed, put ice over your eye area to help reduce swelling: Put ice in a plastic bag. Place a towel between your skin and the bag. Leave the ice on for 20 minutes, 2-3 times a day. If recommended by your health care provider, sleep with one or two extra pillows under your head. Keeping your head raised slightly when lying down can help with swelling and  pain. Medicines Take over-the-counter and prescription medicines only as told by your health care provider. If you were prescribed an antibiotic medicine, take it as told by your health care provider. Do not stop taking the antibiotic even if you start to feel better. General instructions Do not touch, rub, or try to move your eye. Do not blow your nose until your health care provider says it is okay. Stay away from dusty areas. Avoid traveling by plane or going to high-altitude areas until you recover. These activities may slow the healing of your swelling and may increase sinus pain. Keep all follow-up visits as told by your health care provider. This is important. Contact a health care provider if you: Have vision changes, such as double vision that gets worse. Notice that the redness or swelling around your eye gets worse instead of better. Notice blood or fluid starting to leak from your nose. Have a fever. Get help right away if you: Have a sensation that you are seeing flashing lights. Have sudden blindness. Have nausea or vomiting. Notice that your heart is beating much slower than normal. Have chest pain. Are light-headed. Are short of breath. Summary This condition may cause you to see two of everything, with one object appearing higher than the other (vertical diplopia). The fracture itself is usually not treated until all the swelling around the eye has gone away. This may take 1-2 weeks. You should not drive or perform your regular activities without your health care provider's approval.

## 2019-03-17 ENCOUNTER — Other Ambulatory Visit: Payer: Self-pay

## 2019-03-17 DIAGNOSIS — Z20822 Contact with and (suspected) exposure to covid-19: Secondary | ICD-10-CM

## 2019-03-22 LAB — NOVEL CORONAVIRUS, NAA: SARS-CoV-2, NAA: NOT DETECTED

## 2019-03-24 MED FILL — CYCLOBENZAPRINE 5 MG TABLET: 5 | 20 days supply | Qty: 60 | Fill #0

## 2019-03-24 MED FILL — ?DULoxetine HCL 30MG CPEP: 30 | 30 days supply | Qty: 30 | Fill #0

## 2019-03-24 MED FILL — TRIAMCINOLONE 0.025% CREAM: 0.025 | 15 days supply | Qty: 30 | Fill #0

## 2019-03-24 MED FILL — MUPIROCIN 2% OINTMENT: 2 | 15 days supply | Qty: 44 | Fill #0

## 2019-04-12 ENCOUNTER — Other Ambulatory Visit: Payer: Self-pay | Admitting: Nurse Practitioner

## 2019-04-12 DIAGNOSIS — A084 Viral intestinal infection, unspecified: Secondary | ICD-10-CM

## 2019-04-12 DIAGNOSIS — R21 Rash and other nonspecific skin eruption: Secondary | ICD-10-CM

## 2019-04-14 MED FILL — LOSARTAN POTASSIUM 100 MG T: 100 | 30 days supply | Qty: 30 | Fill #0

## 2019-04-14 MED FILL — OMEPRAZOLE DR 40 MG CAPSULE: 40 | 30 days supply | Qty: 30 | Fill #5

## 2019-04-14 MED FILL — ?MIRTAZAPINE 15MG TABLET: 15 | 30 days supply | Qty: 30 | Fill #1

## 2019-04-14 MED FILL — ?AMLODIPINE BESYLATE 10 MG: 10 | 30 days supply | Qty: 30 | Fill #1

## 2019-04-14 MED FILL — CYCLOBENZAPRINE 5 MG TABLET: 5 | 20 days supply | Qty: 60 | Fill #1

## 2019-04-14 MED FILL — ?ATORVASTATIN 40MG TABLET: 40 | 30 days supply | Qty: 30 | Fill #0

## 2019-04-14 MED FILL — !VENTOLIN HFA INHALER: 108 (90 BAS | 25 days supply | Qty: 18 | Fill #1

## 2019-04-14 MED FILL — INVOKANA 100 MG TABLET: 100 | 30 days supply | Qty: 30 | Fill #1

## 2019-04-17 MED ORDER — TRIAMCINOLONE ACETONIDE 0.025 % EX CREA: 1.0000 "application " | TOPICAL_CREAM | Freq: Two times a day (BID) | CUTANEOUS | 0 refills | Status: DC

## 2019-04-17 NOTE — Telephone Encounter (Signed)
Please refill if appropriate

## 2019-04-17 NOTE — Telephone Encounter (Signed)
Pt can obtain OTC - we do not fill it as a prescription.

## 2019-04-18 MED FILL — ?DULoxetine HCL 30MG CPEP: 30 | 30 days supply | Qty: 30 | Fill #1

## 2019-04-18 MED FILL — TRIAMCINOLONE 0.025% CREAM: 0.025 | 15 days supply | Qty: 30 | Fill #0

## 2019-05-05 ENCOUNTER — Encounter (HOSPITAL_COMMUNITY): Payer: Self-pay

## 2019-05-05 ENCOUNTER — Other Ambulatory Visit: Payer: Self-pay

## 2019-05-05 ENCOUNTER — Ambulatory Visit (HOSPITAL_COMMUNITY)
Admission: EM | Admit: 2019-05-05 | Discharge: 2019-05-05 | Disposition: A | Discharge status: Home / Self Care | Payer: Self-pay | Attending: Family Medicine | Admitting: Family Medicine

## 2019-05-05 DIAGNOSIS — S81811A Laceration without foreign body, right lower leg, initial encounter: Secondary | ICD-10-CM

## 2019-05-05 DIAGNOSIS — W208XXA Other cause of strike by thrown, projected or falling object, initial encounter: Secondary | ICD-10-CM

## 2019-05-05 MED ORDER — LIDOCAINE-EPINEPHRINE (PF) 2 %-1:200000 IJ SOLN
INTRAMUSCULAR | Status: AC
  Filled 2019-05-05: qty 20

## 2019-05-05 NOTE — Discharge Instructions (Signed)
Keep clean and dry Return for wound check in 48 hours Return sooner for infection or problems

## 2019-05-05 NOTE — ED Triage Notes (Signed)
Patient presents to Urgent Care with complaints of right lower leg laceration since a metal object fell off of a fork lift and struck him in the leg. Patient reports he thinks his tetanus is up to date.

## 2019-05-05 NOTE — ED Provider Notes (Signed)
[name]    CSN: [id number] Arrival date & time: 05/05/19  1655      History   Chief Complaint Chief Complaint  Patient presents with  . Extremity Laceration    HPI [name] [name] is a 58 y.o. male.   HPI  A large metal object fell off of a forklift and hit him in his right shin.  He has a large V-shaped laceration.  This needs to be repaired.  He also wonders about his tetanus status.  I did check this and it is up-to-date.  Bleeding was controlled with pressure  Past Medical History:  Diagnosis Date  . Barrett's esophagus dx 2016  . Bronchitis   . Depression   . Diabetes ([place])   . ED (erectile dysfunction)   . GERD (gastroesophageal reflux disease)   . Hepatitis C   . Hiatal hernia 10/2014   3cm  . Neuropathy     Patient Active Problem List   Diagnosis Date Noted  . Alcohol abuse with alcohol-induced mood disorder ([place]) 12/17/2017  . Hereditary hemochromatosis ([place]) 11/23/2017  . 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 Essential hypertension 04/02/2017  . Hypogonadism male 08/13/2016  . ETOH abuse 06/22/2016  . Low serum testosterone level 01/08/2016  . Uncontrolled type 2 diabetes mellitus without complication, without long-term current use of insulin ([place]) 01/02/2016  . Erectile dysfunction 09/12/2015  . Hepatic cirrhosis ([place]) 01/10/2015  . Chronic hepatitis C without hepatic coma ([place]) 10/31/2014  . Tobacco use disorder 08/03/2013    Past Surgical History:  Procedure Laterality Date  . APPENDECTOMY    . COLONOSCOPY N/A 02/16/2014   Procedure: COLONOSCOPY;  Surgeon: Gatha Mayer, MD;  Location: [facility];  Service: Endoscopy;  Laterality: N/A;  . COLONOSCOPY    . TONSILLECTOMY         Home Medications    Prior to Admission medications   Medication Sig Start Date End Date Taking? Authorizing Provider  albuterol (VENTOLIN HFA) 108 (90 Base) MCG/ACT inhaler Inhale 2 puffs into the lungs every 6 (six) hours as  needed for wheezing or shortness of breath. 03/01/19   Argentina Donovan, PA-C  amLODipine (NORVASC) 10 MG tablet Take 1 tablet (10 mg total) by mouth daily. 03/01/19 05/30/19  Argentina Donovan, PA-C  aspirin EC 81 MG tablet Take 1 tablet (81 mg total) by mouth daily. 04/02/17   Funches, Adriana Mccallum, MD  atorvastatin (LIPITOR) 40 MG tablet Take 1 tablet (40 mg total) by mouth daily. 03/01/19   Argentina Donovan, PA-C  canagliflozin (INVOKANA) 100 MG TABS tablet Take 1 tablet (100 mg total) by mouth daily before breakfast. 03/01/19 05/30/19  Argentina Donovan, PA-C  cyclobenzaprine (FLEXERIL) 5 MG tablet Take 1 tablet (5 mg total) by mouth 3 (three) times daily as needed for muscle spasms. Patient will pick up scripts today. 03/14/19   [name], NP  DULoxetine (CYMBALTA) 30 MG capsule Take 1 capsule (30 mg total) by mouth daily. Patient will pick up scripts today. 03/14/19   [name], NP  loperamide (IMODIUM A-D) 2 MG tablet Take 1 tablet (2 mg total) by mouth 4 (four) times daily as needed for diarrhea or loose stools. 03/01/19   [name], NP  losartan (COZAAR) 100 MG tablet Take 1 tablet (100 mg total) by mouth daily. 03/01/19   Argentina Donovan, PA-C  mirtazapine (REMERON) 15 MG tablet Take 1 tablet (15 mg total) by mouth at bedtime. 03/01/19  Freeman Caldron M, PA-C  Multiple Vitamins-Minerals (MULTIVITAMIN ADULT) TABS Take 1 tablet by mouth daily. 04/02/17   Funches, Adriana Mccallum, MD  mupirocin ointment (BACTROBAN) 2 % Apply to affected areas twice per day. Patient will pick up scripts today. 03/14/19   [name], NP  omeprazole (PRILOSEC) 40 MG capsule Take 1 capsule (40 mg total) by mouth daily. 06/20/18 03/13/28  [name], NP  ondansetron (ZOFRAN ODT) 4 MG disintegrating tablet Take 1 tablet (4 mg total) by mouth every 8 (eight) hours as needed for nausea or vomiting. 02/13/19   Henderly, Britni A, PA-C  PARoxetine (PAXIL) 10 MG tablet Take 1 tablet (10 mg total) by mouth daily. 03/01/19    Argentina Donovan, PA-C  triamcinolone (KENALOG) 0.025 % cream Apply 1 application topically 2 (two) times daily. Patient will pick up scripts today. 04/17/19   Charlott Rakes, MD  TRUEPLUS LANCETS 28G MISC 1 each by Does not apply route 3 (three) times daily. 02/18/16   Boykin Nearing, MD    Family History Family History  Problem Relation Age of Onset  . Cancer Mother        breast  . Diabetes Mother   . Heart disease Maternal Grandfather   . Pancreatic cancer Paternal Grandmother   . Stroke Father   . Alcohol abuse Father   . Pancreatic cancer Father   . Colon cancer Neg Hx   . Stomach cancer Neg Hx   . Rectal cancer Neg Hx   . Esophageal cancer Neg Hx     Social History Social History   Tobacco Use  . Smoking status: Current Every Day Smoker    Packs/day: 1.00    Years: 35.00    Pack years: 35.00    Types: Cigarettes  . Smokeless tobacco: Never Used  . Tobacco comment: using the patch  Substance Use Topics  . Alcohol use: Yes    Alcohol/week: 27.0 standard drinks    Types: 6 Cans of beer, 21 Standard drinks or equivalent per week    Comment: 6 beers a week   . Drug use: No     Allergies   Bee venom and Lactose intolerance (gi)   Review of Systems Review of Systems  Constitutional: Negative for chills and fever.  HENT: Negative for ear pain and sore throat.   Eyes: Negative for pain and visual disturbance.  Respiratory: Negative for cough and shortness of breath.   Cardiovascular: Negative for chest pain and palpitations.  Gastrointestinal: Negative for abdominal pain and vomiting.  Genitourinary: Negative for dysuria and hematuria.  Musculoskeletal: Negative for arthralgias and back pain.  Skin: Positive for wound. Negative for color change and rash.  Neurological: Negative for seizures and syncope.  All other systems reviewed and are negative.    Physical Exam Triage Vital Signs ED Triage Vitals  Enc Vitals Group     BP 05/05/19 1739 (!) 153/78      Pulse Rate 05/05/19 1739 93     Resp 05/05/19 1739 14     Temp 05/05/19 1739 98.4 F (36.9 C)     Temp Source 05/05/19 1739 Temporal     SpO2 05/05/19 1739 99 %     Weight --      Height --      Head Circumference --      Peak Flow --      Pain Score 05/05/19 1737 2     Pain Loc --      Pain Edu? --  Excl. in [place]? --    No data found.  Updated Vital Signs BP (!) 153/78 (BP Location: Right Arm)   Pulse 93   Temp 98.4 F (36.9 C) (Temporal)   Resp 14   SpO2 99%      Physical Exam Constitutional:      General: He is not in acute distress.    Appearance: He is well-developed.  HENT:     Head: Normocephalic and atraumatic.  Eyes:     Conjunctiva/sclera: Conjunctivae normal.     Pupils: Pupils are equal, round, and reactive to light.  Neck:     Musculoskeletal: Normal range of motion.  Cardiovascular:     Rate and Rhythm: Normal rate.  Pulmonary:     Effort: Pulmonary effort is normal. No respiratory distress.  Abdominal:     General: There is no distension.     Palpations: Abdomen is soft.  Musculoskeletal: Normal range of motion.  Skin:    General: Skin is warm and dry.     Comments: V-shaped laceration, with a lot of maceration and tissue damage, on the front of the left shin.  There is a muscle protruding through the wound.  Length of wound 7 cm  Neurological:     Mental Status: He is alert.      UC Treatments / Results  Labs (all labs ordered are listed, but only abnormal results are displayed) Labs Reviewed - No data to display  EKG   Radiology No results found.  Procedures Laceration Repair  Date/Time: 05/05/2019 9:41 PM Performed by: Raylene Everts, MD Authorized by: Raylene Everts, MD   Consent:    Consent obtained:  Verbal   Consent given by:  Patient   Risks discussed:  Infection, need for additional repair, poor wound healing and poor cosmetic result   Alternatives discussed:  Delayed treatment Anesthesia (see MAR for  exact dosages):    Anesthesia method:  Local infiltration   Local anesthetic:  Lidocaine 2% WITH epi Laceration details:    Location:  Leg   Leg location:  R lower leg   Length (cm):  7   Depth (mm):  20 Repair type:    Repair type:  Intermediate Pre-procedure details:    Preparation:  Patient was prepped and draped in usual sterile fashion Exploration:    Hemostasis achieved with:  Direct pressure   Wound exploration: wound explored through full range of motion     Contaminated: yes   Treatment:    Area cleansed with:  Betadine   Amount of cleaning:  Extensive   Irrigation solution:  Sterile saline   Irrigation volume:  50   Irrigation method:  Syringe   Visualized foreign bodies/material removed: yes   Subcutaneous repair:    Suture size:  4-0   Suture material:  Chromic gut   Suture technique:  Simple interrupted   Number of sutures:  2 Skin repair:    Repair method:  Sutures   Suture size:  3-0   Suture material:  Prolene   Suture technique:  Simple interrupted and horizontal mattress   Number of sutures:  6 Approximation:    Approximation:  Close Post-procedure details:    Dressing:  Bulky dressing   Patient tolerance of procedure:  Tolerated well, no immediate complications   (including critical care time)  Medications Ordered in UC Medications  lidocaine-EPINEPHrine (XYLOCAINE W/EPI) 2 %-1:200000 (PF) injection (has no administration in time range)    Initial Impression / Assessment and Plan /  UC Course  I have reviewed the triage vital signs and the nursing notes.  Pertinent labs & imaging results that were available during my care of the patient were reviewed by me and considered in my medical decision making (see chart for details).      Final Clinical Impressions(s) / UC Diagnoses   Final diagnoses:  Laceration of right lower leg, initial encounter     Discharge Instructions     Keep clean and dry Return for wound check in 48 hours Return  sooner for infection or problems   ED Prescriptions    None     Controlled Substance Prescriptions [place] Controlled Substance Registry consulted? Not Applicable   Raylene Everts, MD 05/05/19 [phone]

## 2019-05-08 ENCOUNTER — Encounter (HOSPITAL_COMMUNITY): Payer: Self-pay

## 2019-05-08 ENCOUNTER — Other Ambulatory Visit: Payer: Self-pay

## 2019-05-08 ENCOUNTER — Ambulatory Visit (HOSPITAL_COMMUNITY)
Admission: EM | Admit: 2019-05-08 | Discharge: 2019-05-08 | Disposition: A | Discharge status: Home / Self Care | Payer: Self-pay | Attending: Family Medicine | Admitting: Family Medicine

## 2019-05-08 DIAGNOSIS — T148XXA Other injury of unspecified body region, initial encounter: Secondary | ICD-10-CM

## 2019-05-08 DIAGNOSIS — L089 Local infection of the skin and subcutaneous tissue, unspecified: Secondary | ICD-10-CM

## 2019-05-08 MED ORDER — SULFAMETHOXAZOLE-TRIMETHOPRIM 800-160 MG PO TABS: 1.0000 | ORAL_TABLET | Freq: Two times a day (BID) | ORAL | 0 refills | Status: AC

## 2019-05-08 MED ORDER — CEFTRIAXONE SODIUM 1 G IJ SOLR
1.0000 g | Freq: Once | INTRAMUSCULAR | Status: AC
  Administered 2019-05-08: 1 g via INTRAMUSCULAR

## 2019-05-08 MED ORDER — CEFTRIAXONE SODIUM 1 G IJ SOLR
INTRAMUSCULAR | Status: AC
  Filled 2019-05-08: qty 10

## 2019-05-08 NOTE — Discharge Instructions (Signed)
You must stay off your leg as much as possible the next couple of days.  Keep it elevated.  Walk only to get meals and go to the bathroom. Take the sulfa antibiotics 2 times a day.  Take 2 doses today.  Take with food You should see improvement in the redness and swelling over the next 24 to 48 hours.  Otherwise, you will need to return for an infection check.   The stitches need to be removed at 10 to 14 days.  This will be done by the nurses here at the office. Please come back if you have any question about the way this is healing

## 2019-05-08 NOTE — ED Provider Notes (Signed)
[place]    CSN: [id number] Arrival date & time: 05/08/19  0932      History   Chief Complaint Chief Complaint  Patient presents with  . Appointment    9;30  . Follow-up    HPI [name] [name] is a 58 y.o. male.   HPI  Had stitches 2 days ago.  He is here for wound check.  His wound is grossly infected.  His ankle swollen and red.  There is purulence draining from the wound.  It is painful.  He worked in his garage all day yesterday.  He has been up on his feet a lot.  He does not have any fever or chills.  Past Medical History:  Diagnosis Date  . Barrett's esophagus dx 2016  . Bronchitis   . Depression   . Diabetes ([place])   . ED (erectile dysfunction)   . GERD (gastroesophageal reflux disease)   . Hepatitis C   . Hiatal hernia 10/2014   3cm  . Neuropathy     Patient Active Problem List   Diagnosis Date Noted  . Alcohol abuse with alcohol-induced mood disorder ([place]) 12/17/2017  . Hereditary hemochromatosis ([place]) 11/23/2017  . 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 Essential hypertension 04/02/2017  . Hypogonadism male 08/13/2016  . ETOH abuse 06/22/2016  . Low serum testosterone level 01/08/2016  . Uncontrolled type 2 diabetes mellitus without complication, without long-term current use of insulin ([place]) 01/02/2016  . Erectile dysfunction 09/12/2015  . Hepatic cirrhosis ([place]) 01/10/2015  . Chronic hepatitis C without hepatic coma ([place]) 10/31/2014  . Tobacco use disorder 08/03/2013    Past Surgical History:  Procedure Laterality Date  . APPENDECTOMY    . COLONOSCOPY N/A 02/16/2014   Procedure: COLONOSCOPY;  Surgeon: Gatha Mayer, MD;  Location: [facility];  Service: Endoscopy;  Laterality: N/A;  . COLONOSCOPY    . TONSILLECTOMY         Home Medications    Prior to Admission medications   Medication Sig Start Date End Date Taking? Authorizing Provider  albuterol (VENTOLIN HFA) 108 (90 Base) MCG/ACT inhaler Inhale 2  puffs into the lungs every 6 (six) hours as needed for wheezing or shortness of breath. 03/01/19   Argentina Donovan, PA-C  amLODipine (NORVASC) 10 MG tablet Take 1 tablet (10 mg total) by mouth daily. 03/01/19 05/30/19  Argentina Donovan, PA-C  aspirin EC 81 MG tablet Take 1 tablet (81 mg total) by mouth daily. 04/02/17   Funches, Adriana Mccallum, MD  atorvastatin (LIPITOR) 40 MG tablet Take 1 tablet (40 mg total) by mouth daily. 03/01/19   Argentina Donovan, PA-C  canagliflozin (INVOKANA) 100 MG TABS tablet Take 1 tablet (100 mg total) by mouth daily before breakfast. 03/01/19 05/30/19  Argentina Donovan, PA-C  cyclobenzaprine (FLEXERIL) 5 MG tablet Take 1 tablet (5 mg total) by mouth 3 (three) times daily as needed for muscle spasms. Patient will pick up scripts today. 03/14/19   [name], NP  DULoxetine (CYMBALTA) 30 MG capsule Take 1 capsule (30 mg total) by mouth daily. Patient will pick up scripts today. 03/14/19   [name], NP  loperamide (IMODIUM A-D) 2 MG tablet Take 1 tablet (2 mg total) by mouth 4 (four) times daily as needed for diarrhea or loose stools. 03/01/19   [name], NP  losartan (COZAAR) 100 MG tablet Take 1 tablet (100 mg total) by mouth daily. 03/01/19   Argentina Donovan, PA-C  mirtazapine (REMERON) 15 MG tablet Take 1 tablet (15 mg total) by mouth at bedtime. 03/01/19   Argentina Donovan, PA-C  Multiple Vitamins-Minerals (MULTIVITAMIN ADULT) TABS Take 1 tablet by mouth daily. 04/02/17   Funches, Adriana Mccallum, MD  mupirocin ointment (BACTROBAN) 2 % Apply to affected areas twice per day. Patient will pick up scripts today. 03/14/19   [name], NP  omeprazole (PRILOSEC) 40 MG capsule Take 1 capsule (40 mg total) by mouth daily. 06/20/18 03/13/28  [name], NP  ondansetron (ZOFRAN ODT) 4 MG disintegrating tablet Take 1 tablet (4 mg total) by mouth every 8 (eight) hours as needed for nausea or vomiting. 02/13/19   Henderly, Britni A, PA-C  PARoxetine (PAXIL) 10 MG tablet Take 1  tablet (10 mg total) by mouth daily. 03/01/19   Argentina Donovan, PA-C  sulfamethoxazole-trimethoprim (BACTRIM DS) 800-160 MG tablet Take 1 tablet by mouth 2 (two) times daily for 7 days. 05/08/19 05/15/19  Raylene Everts, MD  triamcinolone (KENALOG) 0.025 % cream Apply 1 application topically 2 (two) times daily. Patient will pick up scripts today. 04/17/19   Charlott Rakes, MD  TRUEPLUS LANCETS 28G MISC 1 each by Does not apply route 3 (three) times daily. 02/18/16   Boykin Nearing, MD    Family History Family History  Problem Relation Age of Onset  . Cancer Mother        breast  . Diabetes Mother   . Heart disease Maternal Grandfather   . Pancreatic cancer Paternal Grandmother   . Stroke Father   . Alcohol abuse Father   . Pancreatic cancer Father   . Colon cancer Neg Hx   . Stomach cancer Neg Hx   . Rectal cancer Neg Hx   . Esophageal cancer Neg Hx     Social History Social History   Tobacco Use  . Smoking status: Current Every Day Smoker    Packs/day: 1.00    Years: 35.00    Pack years: 35.00    Types: Cigarettes  . Smokeless tobacco: Never Used  . Tobacco comment: using the patch  Substance Use Topics  . Alcohol use: Yes    Alcohol/week: 27.0 standard drinks    Types: 6 Cans of beer, 21 Standard drinks or equivalent per week    Comment: 6 beers a week   . Drug use: No     Allergies   Bee venom and Lactose intolerance (gi)   Review of Systems Review of Systems  Constitutional: Negative for chills and fever.  HENT: Negative for ear pain and sore throat.   Eyes: Negative for pain and visual disturbance.  Respiratory: Negative for cough and shortness of breath.   Cardiovascular: Positive for leg swelling. Negative for chest pain and palpitations.  Gastrointestinal: Negative for abdominal pain and vomiting.  Genitourinary: Negative for dysuria and hematuria.  Musculoskeletal: Negative for arthralgias and back pain.  Skin: Positive for color change and wound.  Negative for rash.  Neurological: Negative for seizures and syncope.  All other systems reviewed and are negative.    Physical Exam Triage Vital Signs ED Triage Vitals [05/08/19 0954]  Enc Vitals Group     BP 125/65     Pulse Rate 97     Resp 16     Temp 98.1 F (36.7 C)     Temp Source Temporal     SpO2 97 %     Weight      Height      Head Circumference  Peak Flow      Pain Score 1     Pain Loc      Pain Edu?      Excl. in [place]?    No data found.  Updated Vital Signs BP 125/65 (BP Location: Right Arm)   Pulse 97   Temp 98.1 F (36.7 C) (Temporal)   Resp 16   SpO2 97%   :     Physical Exam Constitutional:      General: He is not in acute distress.    Appearance: He is well-developed.  HENT:     Head: Normocephalic and atraumatic.  Eyes:     Conjunctiva/sclera: Conjunctivae normal.     Pupils: Pupils are equal, round, and reactive to light.  Neck:     Musculoskeletal: Normal range of motion.  Cardiovascular:     Rate and Rhythm: Normal rate.  Pulmonary:     Effort: Pulmonary effort is normal. No respiratory distress.  Abdominal:     General: There is no distension.     Palpations: Abdomen is soft.  Musculoskeletal: Normal range of motion.  Skin:    General: Skin is warm and dry.     Comments: See photo  Neurological:     Mental Status: He is alert.        UC Treatments / Results  Labs (all labs ordered are listed, but only abnormal results are displayed) Labs Reviewed - No data to display  EKG   Radiology No results found.  Procedures Procedures (including critical care time)  Medications Ordered in UC Medications  cefTRIAXone (ROCEPHIN) injection 1 g (has no administration in time range)    Initial Impression / Assessment and Plan / UC Course  I have reviewed the triage vital signs and the nursing notes.  Pertinent labs & imaging results that were available during my care of the patient were reviewed by me and considered in  my medical decision making (see chart for details).      Final Clinical Impressions(s) / UC Diagnoses   Final diagnoses:  Post-traumatic wound infection     Discharge Instructions     You must stay off your leg as much as possible the next couple of days.  Keep it elevated.  Walk only to get meals and go to the bathroom. Take the sulfa antibiotics 2 times a day.  Take 2 doses today.  Take with food You should see improvement in the redness and swelling over the next 24 to 48 hours.  Otherwise, you will need to return for an infection check.   The stitches need to be removed at 10 to 14 days.  This will be done by the nurses here at the office. Please come back if you have any question about the way this is healing   ED Prescriptions    Medication Sig Dispense Auth. Provider   sulfamethoxazole-trimethoprim (BACTRIM DS) 800-160 MG tablet Take 1 tablet by mouth 2 (two) times daily for 7 days. 14 tablet Raylene Everts, MD     Controlled Substance Prescriptions [place] Controlled Substance Registry consulted? Not Applicable   Raylene Everts, MD 05/08/19 1126

## 2019-05-08 NOTE — ED Triage Notes (Signed)
Patient presents to Urgent Care with complaints of needing a follow-up for his leg lac from 3 days ago. Patient reports he is not ready to have his stiches removed, just wanted to make sure everything was looking good.

## 2019-05-09 ENCOUNTER — Ambulatory Visit: Payer: Self-pay | Attending: Nurse Practitioner | Admitting: Pharmacist

## 2019-05-09 DIAGNOSIS — Z23 Encounter for immunization: Secondary | ICD-10-CM

## 2019-05-09 MED FILL — SULFAMETHOXAZOLE-TMP DS TAB: 800-160 | 7 days supply | Qty: 14 | Fill #0

## 2019-05-09 NOTE — Progress Notes (Signed)
Patient presents for vaccination against influenza per orders of Zelda. Consent given. Counseling provided. No contraindications exists. Vaccine administered without incident.   

## 2019-05-19 ENCOUNTER — Other Ambulatory Visit: Payer: Self-pay | Admitting: Nurse Practitioner

## 2019-05-19 DIAGNOSIS — Z09 Encounter for follow-up examination after completed treatment for conditions other than malignant neoplasm: Secondary | ICD-10-CM

## 2019-05-19 DIAGNOSIS — K219 Gastro-esophageal reflux disease without esophagitis: Secondary | ICD-10-CM

## 2019-05-19 MED FILL — DULoxetine HCL 30 MG CPEP: 30 | 30 days supply | Qty: 30 | Fill #2

## 2019-05-19 MED FILL — INVOKANA 100 MG TABLET: 100 | 30 days supply | Qty: 30 | Fill #2

## 2019-05-19 MED FILL — ?ATORVASTATIN 40MG TABLET: 40 | 30 days supply | Qty: 30 | Fill #1

## 2019-05-19 MED FILL — OMEPRAZOLE DR 40 MG CAPSULE: 40 | 30 days supply | Qty: 30 | Fill #0

## 2019-05-19 MED FILL — LOSARTAN POTASSIUM 100 MG T: 100 | 30 days supply | Qty: 30 | Fill #1

## 2019-05-19 MED FILL — !VENTOLIN HFA INHALER: 108 (90 BAS | 25 days supply | Qty: 18 | Fill #1

## 2019-05-19 MED FILL — MIRTAZAPINE 15 MG TABS: 15 | 30 days supply | Qty: 30 | Fill #2

## 2019-05-19 MED FILL — ?AMLODIPINE BESYLATE 10 MG: 10 | 30 days supply | Qty: 30 | Fill #2

## 2019-05-22 MED FILL — CYCLOBENZAPRINE 5 MG TABLET: 5 | 20 days supply | Qty: 60 | Fill #0

## 2019-06-16 ENCOUNTER — Other Ambulatory Visit: Payer: Self-pay | Admitting: Nurse Practitioner

## 2019-06-16 ENCOUNTER — Other Ambulatory Visit: Payer: Self-pay

## 2019-06-16 ENCOUNTER — Encounter: Payer: Self-pay | Admitting: Nurse Practitioner

## 2019-06-16 ENCOUNTER — Ambulatory Visit: Payer: Self-pay | Attending: Nurse Practitioner | Admitting: Nurse Practitioner

## 2019-06-16 VITALS — BP 147/78 | HR 86 | Temp 98.9°F | Ht 71.0 in | Wt 177.8 lb

## 2019-06-16 DIAGNOSIS — E1165 Type 2 diabetes mellitus with hyperglycemia: Secondary | ICD-10-CM

## 2019-06-16 DIAGNOSIS — I10 Essential (primary) hypertension: Secondary | ICD-10-CM

## 2019-06-16 DIAGNOSIS — F17200 Nicotine dependence, unspecified, uncomplicated: Secondary | ICD-10-CM

## 2019-06-16 DIAGNOSIS — E785 Hyperlipidemia, unspecified: Secondary | ICD-10-CM

## 2019-06-16 DIAGNOSIS — K7460 Unspecified cirrhosis of liver: Secondary | ICD-10-CM

## 2019-06-16 DIAGNOSIS — IMO0002 Reserved for concepts with insufficient information to code with codable children: Secondary | ICD-10-CM

## 2019-06-16 DIAGNOSIS — E1140 Type 2 diabetes mellitus with diabetic neuropathy, unspecified: Secondary | ICD-10-CM

## 2019-06-16 DIAGNOSIS — M2550 Pain in unspecified joint: Secondary | ICD-10-CM

## 2019-06-16 LAB — POCT GLYCOSYLATED HEMOGLOBIN (HGB A1C): Hemoglobin A1C: 8.5 % — AB (ref 4.0–5.6)

## 2019-06-16 LAB — GLUCOSE, POCT (MANUAL RESULT ENTRY): POC Glucose: 162 mg/dl — AB (ref 70–99)

## 2019-06-16 MED ORDER — ALBUTEROL SULFATE HFA 108 (90 BASE) MCG/ACT IN AERS: 2.0000 | INHALATION_SPRAY | Freq: Four times a day (QID) | RESPIRATORY_TRACT | 1 refills | Status: DC | PRN

## 2019-06-16 MED ORDER — DICLOFENAC SODIUM 1 % TD GEL: 2.0000 g | Freq: Four times a day (QID) | TRANSDERMAL | 1 refills | Status: AC

## 2019-06-16 MED ORDER — CANAGLIFLOZIN 300 MG PO TABS: 300.0000 mg | ORAL_TABLET | Freq: Every day | ORAL | 1 refills | Status: AC

## 2019-06-16 MED ORDER — AMLODIPINE BESYLATE 10 MG PO TABS: 10.0000 mg | ORAL_TABLET | Freq: Every day | ORAL | 1 refills | Status: DC

## 2019-06-16 MED FILL — DULoxetine HCL 30 MG CPEP: 30 | 30 days supply | Qty: 30 | Fill #3

## 2019-06-16 MED FILL — DICLOFENAC SODIUM 1% GEL: 1 | 12 days supply | Qty: 100 | Fill #0

## 2019-06-16 MED FILL — !VENTOLIN HFA INHALER: 108 (90 BAS | 25 days supply | Qty: 18 | Fill #0

## 2019-06-16 MED FILL — PAROXETINE HCL 10 MG TABS: 10 | 30 days supply | Qty: 30 | Fill #1

## 2019-06-16 MED FILL — ?ATORVASTATIN 40MG TABLET: 40 | 30 days supply | Qty: 30 | Fill #2

## 2019-06-16 MED FILL — CYCLOBENZAPRINE 5 MG TABLET: 5 | 20 days supply | Qty: 60 | Fill #0

## 2019-06-16 MED FILL — OMEPRAZOLE DR 40 MG CAPSULE: 40 | 30 days supply | Qty: 30 | Fill #1

## 2019-06-16 MED FILL — MIRTAZAPINE 15 MG TABS: 15 | 30 days supply | Qty: 30 | Fill #3

## 2019-06-16 MED FILL — AMLODIPINE BESYLATE 10 MG T: 10 | 30 days supply | Qty: 30 | Fill #0

## 2019-06-16 MED FILL — INVOKANA 100 MG TABLET: 100 | 30 days supply | Qty: 30 | Fill #3

## 2019-06-16 MED FILL — LOSARTAN POTASSIUM 100 MG T: 100 | 30 days supply | Qty: 30 | Fill #2

## 2019-06-16 NOTE — Progress Notes (Signed)
Assessment & Plan:  [name] [name] was seen today for follow-up.  Diagnoses and all orders for this visit:  Uncontrolled diabetes mellitus with diabetic neuropathy (HCC) -     Glucose (CBG) -     HgB A1c -     canagliflozin (INVOKANA) 300 MG TABS tablet; Take 1 tablet (300 mg total) by mouth daily before breakfast. -     Ambulatory referral to Ophthalmology Continue blood sugar control as discussed in office today, low carbohydrate diet, and regular physical exercise as tolerated, 150 minutes per week (30 min each day, 5 days per week, or 50 min 3 days per week). Keep blood sugar logs with fasting goal of 90-130 mg/dl, post prandial (after you eat) less than 180.  For Hypoglycemia: BS <60 and Hyperglycemia BS >400; contact the clinic ASAP. Annual eye exams and foot exams are recommended.  Essential hypertension -     amLODipine (NORVASC) 10 MG tablet; Take 1 tablet (10 mg total) by mouth daily. -     CMP14+EGFR Continue all antihypertensives as prescribed.  Remember to bring in your blood pressure log with you for your follow up appointment.  DASH/Mediterranean Diets are healthier choices for HTN.   Arthralgia of multiple joints Chronic right shoulder and low back pain. Unrelated to injury. -     diclofenac sodium (VOLTAREN) 1 % GEL; Apply 2 g topically 4 (four) times daily. Work on losing weight to help reduce joint pain. May alternate with heat and ice application for pain relief. May also alternate with acetaminophen as prescribed pain relief. Other alternatives include massage, acupuncture and water aerobics.  You must stay active and avoid a sedentary lifestyle.  Cirrhosis of liver without ascites, unspecified hepatic cirrhosis type (HCC) -     CMP14+EGFR AVOID ALCOHOL. Declines community resources  Hereditary hemochromatosis ([place])  Tobacco use disorder -     albuterol (VENTOLIN HFA) 108 (90 Base) MCG/ACT inhaler; Inhale 2 puffs into the lungs every 6 (six) hours as needed for  wheezing or shortness of breath. [name] was counseled on the dangers of tobacco use, and was advised to quit. Reviewed strategies to maximize success, including removing cigarettes and smoking materials from environment, stress management and support of family/friends as well as pharmacological alternatives including: Wellbutrin, Chantix, Nicotine patch, Nicotine gum or lozenges. Smoking cessation support: smoking cessation hotline: 1-800-QUIT-NOW.  Smoking cessation classes are also available through [facility]. Call [phone] or visit our website at [email or web address]   A total of  minutes was spent on counseling for smoking cessation and Olin is not ready to quit.   Hyperlipidemia LDL goal <70 -     Lipid panel INSTRUCTIONS: Work on a low fat, heart healthy diet and participate in regular aerobic exercise program by working out at least 150 minutes per week; 5 days a week-30 minutes per day. Avoid red meat, fried foods. junk foods, sodas, sugary drinks, unhealthy snacking, alcohol and smoking.  Drink at least 48oz of water per day and monitor your carbohydrate intake daily.      Patient has been counseled on age-appropriate routine health concerns for screening and prevention. These are reviewed and up-to-date. Referrals have been placed accordingly. Immunizations are up-to-date or declined.    Subjective:   Chief Complaint  Patient presents with   Follow-up    Pt. is here for a follow up on diabetes.   HPI [name] [name] 58 y.o. male presents to office today for follow up. He is requesting  that I remove 2 stitches from his lower right leg. He has a laceration which required stitches from the Urgent care on May 08, 2019.  They had instructed him to return and 10 to 14 days for stitch removal however unfortunately patient did not return and decided to remove stitches himself.  There are 2 stitches remaining and I have instructed patient that I am  unable to take the stitches out due to the length of time that they have been in.  He was deferred back to the urgent care for removal. There are currently no signs of skin infection. He continues to drink ETOH.   DM TYPE 2 Not well controlled due to diet.  He is drinking sodas daily and A1c has increased from 7.6-8.5. He has stopped taking invokana. Not sure why. Will refill. He remains uninsured. We have discussed applying for the FA program numerous times.  Hypoglycemic symptoms include peripheral neuropathy.  Taking Cymbalta 30 mg  daily Lab Results  Component Value Date   HGBA1C 8.5 (A) 06/16/2019   Lab Results  Component Value Date   HGBA1C 7.6 (A) 03/14/2019   Essential Hypertension Not well controlled today.  Weight is up 10 pounds.  Just started working out last month. Trying to lose weight.  However not diet compliant.  Endorses medication compliance taking amlodipine 10 mg daily, losartan 100 mg daily. Denies chest pain, shortness of breath, palpitations, lightheadedness, dizziness, headaches or BLE edema.  He does not monitor his blood pressure at home. BP Readings from Last 3 Encounters:  06/16/19 (!) 147/78  05/08/19 125/65  05/05/19 (!) 153/78    DYSLIPIDEMIA LDL not at goal of less than 70.  Fasting lipid panel drawn today.  He endorses medication compliance taking atorvastatin 40 mg daily and denies any statin intolerance. Lab Results  Component Value Date   LDLCALC 90 04/18/2018   Review of Systems  Constitutional: Negative for fever, malaise/fatigue and weight loss.  HENT: Negative.  Negative for nosebleeds.   Eyes: Negative.  Negative for blurred vision, double vision and photophobia.  Respiratory: Negative.  Negative for cough and shortness of breath.   Cardiovascular: Negative.  Negative for chest pain, palpitations and leg swelling.  Gastrointestinal: Positive for heartburn. Negative for nausea and vomiting.  Musculoskeletal: Positive for joint pain (right  shoulder and lower back; chronic). Negative for myalgias.  Neurological: Positive for tingling. Negative for dizziness, focal weakness, seizures and headaches.  Psychiatric/Behavioral: Positive for depression. Negative for suicidal ideas.    Past Medical History:  Diagnosis Date   Barrett's esophagus dx 2016   Bronchitis    Depression    Diabetes [facility])    ED (erectile dysfunction)    GERD (gastroesophageal reflux disease)    Hepatitis C    Hiatal hernia 10/2014   3cm   Neuropathy     Past Surgical History:  Procedure Laterality Date   APPENDECTOMY     COLONOSCOPY N/A 02/16/2014   Procedure: COLONOSCOPY;  Surgeon: Gatha Mayer, MD;  Location: [facility];  Service: Endoscopy;  Laterality: N/A;   COLONOSCOPY     TONSILLECTOMY      Family History  Problem Relation Age of Onset   Cancer Mother        breast   Diabetes Mother    Heart disease Maternal Grandfather    Pancreatic cancer Paternal Grandmother    Stroke Father    Alcohol abuse Father    Pancreatic cancer Father    Colon cancer Neg Hx  Stomach cancer Neg Hx   °• Rectal cancer Neg Hx   °• Esophageal cancer Neg Hx   ° ° °Social History Reviewed with no changes to be made today.  ° °Outpatient Medications Prior to Visit  °Medication Sig Dispense Refill  °• aspirin EC 81 MG tablet Take 1 tablet (81 mg total) by mouth daily. 30 tablet 11  °• atorvastatin (LIPITOR) 40 MG tablet Take 1 tablet (40 mg total) by mouth daily. 90 tablet 1  °• cyclobenzaprine (FLEXERIL) 5 MG tablet TAKE 1 TABLET (5 MG TOTAL) BY MOUTH 3 (THREE) TIMES DAILY AS NEEDED FOR MUSCLE SPASMS. 60 tablet 1  °• DULoxetine (CYMBALTA) 30 MG capsule Take 1 capsule (30 mg total) by mouth daily. Patient will pick up scripts today. 30 capsule 3  °• loperamide (IMODIUM A-D) 2 MG tablet Take 1 tablet (2 mg total) by mouth 4 (four) times daily as needed for diarrhea or loose stools. 60 tablet 0  °• losartan (COZAAR) 100 MG tablet Take 1 tablet  (100 mg total) by mouth daily. 90 tablet 1  °• mirtazapine (REMERON) 15 MG tablet Take 1 tablet (15 mg total) by mouth at bedtime. 30 tablet 3  °• Multiple Vitamins-Minerals (MULTIVITAMIN ADULT) TABS Take 1 tablet by mouth daily. 30 tablet 11  °• mupirocin ointment (BACTROBAN) 2 % Apply to affected areas twice per day. Patient will pick up scripts today. 60 g 1  °• omeprazole (PRILOSEC) 40 MG capsule TAKE 1 CAPSULE (40 MG TOTAL) BY MOUTH DAILY. 30 capsule 1  °• ondansetron (ZOFRAN ODT) 4 MG disintegrating tablet Take 1 tablet (4 mg total) by mouth every 8 (eight) hours as needed for nausea or vomiting. 20 tablet 0  °• PARoxetine (PAXIL) 10 MG tablet Take 1 tablet (10 mg total) by mouth daily. 90 tablet 1  °• triamcinolone (KENALOG) 0.025 % cream Apply 1 application topically 2 (two) times daily. Patient will pick up scripts today. 30 g 0  °• albuterol (VENTOLIN HFA) 108 (90 Base) MCG/ACT inhaler Inhale 2 puffs into the lungs every 6 (six) hours as needed for wheezing or shortness of breath. 18 g 1  °• TRUEPLUS LANCETS 28G MISC 1 each by Does not apply route 3 (three) times daily. 100 each 11  °• amLODipine (NORVASC) 10 MG tablet Take 1 tablet (10 mg total) by mouth daily. 90 tablet 0  ° °No facility-administered medications prior to visit.   ° ° °Allergies  °Allergen Reactions  °• Bee Venom Anaphylaxis  °• Lactose Intolerance (Gi) Other (See Comments)  °  GI upset  ° ° °   °Objective:  °  °BP (!) 147/78 (BP Location: Left Arm, Patient Position: Sitting, Cuff Size: Normal)    Pulse 86    Temp 98.9 °F (37.2 °C) (Oral)    Ht 5' 11" (1.803 m)    Wt 177 lb 12.8 oz (80.6 kg)    SpO2 95%    BMI 24.80 kg/m²  °Wt Readings from Last 3 Encounters:  °06/16/19 177 lb 12.8 oz (80.6 kg)  °03/14/19 166 lb (75.3 kg)  °12/06/18 171 lb 6.4 oz (77.7 kg)  ° ° °Physical Exam °Vitals signs and nursing note reviewed.  °Constitutional:   °   Appearance: He is well-developed.  °HENT:  °   Head: Normocephalic and atraumatic.  °Neck:  °    Musculoskeletal: Normal range of motion.  °Cardiovascular:  °   Rate and Rhythm: Normal rate and regular rhythm.  °   Heart sounds: Normal heart   sounds. No murmur. No friction rub. No gallop.   °Pulmonary:  °   Effort: Pulmonary effort is normal. No tachypnea or respiratory distress.  °   Breath sounds: Normal breath sounds. No decreased breath sounds, wheezing, rhonchi or rales.  °Chest:  °   Chest wall: No tenderness.  °Abdominal:  °   General: Bowel sounds are normal.  °   Palpations: Abdomen is soft.  °Musculoskeletal: Normal range of motion.  °   Right shoulder: He exhibits tenderness. He exhibits normal range of motion.  °     Arms: ° °     Legs: ° °Skin: °   General: Skin is warm and dry.  °Neurological:  °   Mental Status: He is alert and oriented to person, place, and time.  °   Coordination: Coordination normal.  °Psychiatric:     °   Behavior: Behavior normal. Behavior is cooperative.     °   Thought Content: Thought content normal.     °   Judgment: Judgment normal.  ° ° ° ° ° °   °Patient has been counseled extensively about nutrition and exercise as well as the importance of adherence with medications and regular follow-up. The patient was given clear instructions to go to ER or return to medical center if symptoms don't improve, worsen or new problems develop. The patient verbalized understanding.  ° °Follow-up: Return in about 3 months (around 09/16/2019) for DM TYPE 2, HTN.  ° °Zelda W Fleming, FNP-BC °[facility] °[place], [place] °[phone]   °06/16/2019, 2:37 PM °

## 2019-06-17 LAB — CMP14+EGFR
ALT: 132 IU/L — ABNORMAL HIGH (ref 0–44)
AST: 170 IU/L — ABNORMAL HIGH (ref 0–40)
Albumin/Globulin Ratio: 1.4 (ref 1.2–2.2)
Albumin: 4.7 g/dL (ref 3.8–4.9)
Alkaline Phosphatase: 110 IU/L (ref 39–117)
BUN/Creatinine Ratio: 15 (ref 9–20)
BUN: 9 mg/dL (ref 6–24)
Bilirubin Total: 0.5 mg/dL (ref 0.0–1.2)
CO2: 22 mmol/L (ref 20–29)
Calcium: 9.4 mg/dL (ref 8.7–10.2)
Chloride: 101 mmol/L (ref 96–106)
Creatinine, Ser: 0.62 mg/dL — ABNORMAL LOW (ref 0.76–1.27)
GFR calc Af Amer: 127 mL/min/{1.73_m2} (ref >59)
GFR calc non Af Amer: 110 mL/min/{1.73_m2} (ref >59)
Globulin, Total: 3.3 g/dL (ref 1.5–4.5)
Glucose: 120 mg/dL — ABNORMAL HIGH (ref 65–99)
Potassium: 3.9 mmol/L (ref 3.5–5.2)
Sodium: 137 mmol/L (ref 134–144)
Total Protein: 8 g/dL (ref 6.0–8.5)

## 2019-06-17 LAB — LIPID PANEL
Chol/HDL Ratio: 2.8 ratio (ref 0.0–5.0)
Cholesterol, Total: 135 mg/dL (ref 100–199)
HDL: 49 mg/dL (ref >39)
LDL Chol Calc (NIH): 72 mg/dL (ref 0–99)
Triglycerides: 71 mg/dL (ref 0–149)
VLDL Cholesterol Cal: 14 mg/dL (ref 5–40)

## 2019-07-03 ENCOUNTER — Other Ambulatory Visit: Payer: Self-pay | Admitting: Nurse Practitioner

## 2019-07-03 DIAGNOSIS — R21 Rash and other nonspecific skin eruption: Secondary | ICD-10-CM

## 2019-07-03 MED FILL — ?MIRTAZAPINE 15 MG TABLET: 15 | 30 days supply | Qty: 30 | Fill #3

## 2019-07-03 MED FILL — DULoxetine HCL 30 MG CPEP: 30 | 30 days supply | Qty: 30 | Fill #3

## 2019-07-03 MED FILL — LOSARTAN POTASSIUM 100 MG T: 100 | 30 days supply | Qty: 30 | Fill #2

## 2019-07-03 MED FILL — !VENTOLIN HFA INHALER: 108 (90 BAS | 25 days supply | Qty: 18 | Fill #0

## 2019-07-03 MED FILL — OMEPRAZOLE DR 40 MG CAPSULE: 40 | 30 days supply | Qty: 30 | Fill #1

## 2019-07-03 MED FILL — ?ATORVASTATIN 40MG TABLET: 40 | 30 days supply | Qty: 30 | Fill #2

## 2019-07-03 MED FILL — PAROXETINE HCL 10 MG TABS: 10 | 30 days supply | Qty: 30 | Fill #1

## 2019-07-03 MED FILL — ?AMLODIPINE BESYLATE 10 MG: 10 | 30 days supply | Qty: 30 | Fill #0

## 2019-07-03 MED FILL — CYCLOBENZAPRINE 5 MG TABLET: 5 | 20 days supply | Qty: 60 | Fill #0

## 2019-07-03 MED FILL — DICLOFENAC SODIUM 1% GEL: 1 | 12 days supply | Qty: 100 | Fill #0

## 2019-07-03 NOTE — Telephone Encounter (Signed)
Please fill if appropriate. Patient did not discuss this concern with PCP on 06/16/2019 visit.

## 2019-07-04 MED FILL — TRIAMCINOLONE 0.025% CREAM: 0.025 | 15 days supply | Qty: 30 | Fill #0

## 2019-07-07 MED FILL — INVOKANA 300 MG TABLET: 300 | 30 days supply | Qty: 30 | Fill #0

## 2019-07-18 ENCOUNTER — Other Ambulatory Visit: Payer: Self-pay

## 2019-07-18 ENCOUNTER — Ambulatory Visit (HOSPITAL_COMMUNITY)
Admission: EM | Admit: 2019-07-18 | Discharge: 2019-07-18 | Disposition: A | Discharge status: Home / Self Care | Payer: Self-pay | Attending: Family Medicine | Admitting: Family Medicine

## 2019-07-18 ENCOUNTER — Encounter (HOSPITAL_COMMUNITY): Payer: Self-pay

## 2019-07-18 DIAGNOSIS — Z79899 Other long term (current) drug therapy: Secondary | ICD-10-CM | POA: Insufficient documentation

## 2019-07-18 DIAGNOSIS — B182 Chronic viral hepatitis C: Secondary | ICD-10-CM | POA: Insufficient documentation

## 2019-07-18 DIAGNOSIS — K219 Gastro-esophageal reflux disease without esophagitis: Secondary | ICD-10-CM | POA: Insufficient documentation

## 2019-07-18 DIAGNOSIS — Z4802 Encounter for removal of sutures: Secondary | ICD-10-CM | POA: Insufficient documentation

## 2019-07-18 DIAGNOSIS — F17210 Nicotine dependence, cigarettes, uncomplicated: Secondary | ICD-10-CM | POA: Insufficient documentation

## 2019-07-18 DIAGNOSIS — E119 Type 2 diabetes mellitus without complications: Secondary | ICD-10-CM | POA: Insufficient documentation

## 2019-07-18 DIAGNOSIS — I10 Essential (primary) hypertension: Secondary | ICD-10-CM | POA: Insufficient documentation

## 2019-07-18 DIAGNOSIS — M2550 Pain in unspecified joint: Secondary | ICD-10-CM | POA: Insufficient documentation

## 2019-07-18 DIAGNOSIS — Z20828 Contact with and (suspected) exposure to other viral communicable diseases: Secondary | ICD-10-CM | POA: Insufficient documentation

## 2019-07-18 DIAGNOSIS — Z7984 Long term (current) use of oral hypoglycemic drugs: Secondary | ICD-10-CM | POA: Insufficient documentation

## 2019-07-18 DIAGNOSIS — Z7982 Long term (current) use of aspirin: Secondary | ICD-10-CM | POA: Insufficient documentation

## 2019-07-18 DIAGNOSIS — R52 Pain, unspecified: Secondary | ICD-10-CM

## 2019-07-18 DIAGNOSIS — F329 Major depressive disorder, single episode, unspecified: Secondary | ICD-10-CM | POA: Insufficient documentation

## 2019-07-18 NOTE — ED Provider Notes (Signed)
[facility]    CSN: [id number] Arrival date & time: 07/18/19  1008      History   Chief Complaint Chief Complaint  Patient presents with  . Joint Pain  . Suture / Staple Removal    HPI [name] [name] is a 58 y.o. male.   Patient is a 58 year old male with past medical history of Barrett's esophagus, bronchitis, depression, diabetes, GERD, hepatitis, neuropathy.  He presents today with generalized body aches this started suddenly this morning.  Yesterday he had some fatigue after working.  Low-grade fever here today.  Symptoms have been constant.  He took some Tylenol last night with some relief.  Denies any cough, chest congestion, sore throat, ear pain.  He has had some mild chills.  Also here for wound check.  Had a wound sutured a few months back.  Remove the stitches on his own.  Unsure if one was left in.  He has had.  To the wound that remained swollen.  No pain.  Clear drainage.  ROS per HPI       Past Medical History:  Diagnosis Date  . Barrett's esophagus dx 2016  . Bronchitis   . Depression   . Diabetes ([place])   . ED (erectile dysfunction)   . GERD (gastroesophageal reflux disease)   . Hepatitis C   . Hiatal hernia 10/2014   3cm  . Neuropathy     Patient Active Problem List   Diagnosis Date Noted  . Alcohol abuse with alcohol-induced mood disorder ([place]) 12/17/2017  . Hereditary hemochromatosis ([place]) 11/23/2017  . 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 Essential hypertension 04/02/2017  . Hypogonadism male 08/13/2016  . ETOH abuse 06/22/2016  . Low serum testosterone level 01/08/2016  . Uncontrolled type 2 diabetes mellitus without complication, without long-term current use of insulin 01/02/2016  . Erectile dysfunction 09/12/2015  . Hepatic cirrhosis ([place]) 01/10/2015  . Chronic hepatitis C without hepatic coma ([place]) 10/31/2014  . Tobacco use disorder 08/03/2013    Past Surgical History:  Procedure Laterality Date   . APPENDECTOMY    . COLONOSCOPY N/A 02/16/2014   Procedure: COLONOSCOPY;  Surgeon: Gatha Mayer, MD;  Location: [facility];  Service: Endoscopy;  Laterality: N/A;  . COLONOSCOPY    . TONSILLECTOMY         Home Medications    Prior to Admission medications   Medication Sig Start Date End Date Taking? Authorizing Provider  albuterol (VENTOLIN HFA) 108 (90 Base) MCG/ACT inhaler Inhale 2 puffs into the lungs every 6 (six) hours as needed for wheezing or shortness of breath. 06/16/19   [name], NP  amLODipine (NORVASC) 10 MG tablet Take 1 tablet (10 mg total) by mouth daily. 06/16/19 09/14/19  [name], NP  aspirin EC 81 MG tablet Take 1 tablet (81 mg total) by mouth daily. 04/02/17   Funches, Adriana Mccallum, MD  atorvastatin (LIPITOR) 40 MG tablet Take 1 tablet (40 mg total) by mouth daily. 03/01/19   Argentina Donovan, PA-C  canagliflozin (INVOKANA) 300 MG TABS tablet Take 1 tablet (300 mg total) by mouth daily before breakfast. 06/16/19 09/14/19  [name], NP  cyclobenzaprine (FLEXERIL) 5 MG tablet TAKE 1 TABLET (5 MG TOTAL) BY MOUTH 3 (THREE) TIMES DAILY AS NEEDED FOR MUSCLE SPASMS. 05/20/19   [name], NP  DULoxetine (CYMBALTA) 30 MG capsule Take 1 capsule (30 mg total) by mouth daily. Patient will pick up scripts today. 03/14/19   Raul Del,  Vernia Buff, NP  loperamide (IMODIUM A-D) 2 MG tablet Take 1 tablet (2 mg total) by mouth 4 (four) times daily as needed for diarrhea or loose stools. 03/01/19   [name], NP  losartan (COZAAR) 100 MG tablet Take 1 tablet (100 mg total) by mouth daily. 03/01/19   Argentina Donovan, PA-C  mirtazapine (REMERON) 15 MG tablet Take 1 tablet (15 mg total) by mouth at bedtime. 03/01/19   Argentina Donovan, PA-C  Multiple Vitamins-Minerals (MULTIVITAMIN ADULT) TABS Take 1 tablet by mouth daily. 04/02/17   Funches, Adriana Mccallum, MD  mupirocin ointment (BACTROBAN) 2 % Apply to affected areas twice per day. Patient will pick up scripts today. 03/14/19    [name], NP  omeprazole (PRILOSEC) 40 MG capsule TAKE 1 CAPSULE (40 MG TOTAL) BY MOUTH DAILY. 05/19/19 08/17/19  [name], NP  ondansetron (ZOFRAN ODT) 4 MG disintegrating tablet Take 1 tablet (4 mg total) by mouth every 8 (eight) hours as needed for nausea or vomiting. 02/13/19   Henderly, Britni A, PA-C  PARoxetine (PAXIL) 10 MG tablet Take 1 tablet (10 mg total) by mouth daily. 03/01/19   Argentina Donovan, PA-C  triamcinolone (KENALOG) 0.025 % cream APPLY 1 APPLICATION TOPICALLY 2 (TWO) TIMES DAILY. 07/03/19   [name], NP  TRUEPLUS LANCETS 28G MISC 1 each by Does not apply route 3 (three) times daily. 02/18/16   Boykin Nearing, MD    Family History Family History  Problem Relation Age of Onset  . Cancer Mother        breast  . Diabetes Mother   . Heart disease Maternal Grandfather   . Pancreatic cancer Paternal Grandmother   . Stroke Father   . Alcohol abuse Father   . Pancreatic cancer Father   . Colon cancer Neg Hx   . Stomach cancer Neg Hx   . Rectal cancer Neg Hx   . Esophageal cancer Neg Hx     Social History Social History   Tobacco Use  . Smoking status: Current Every Day Smoker    Packs/day: 1.00    Years: 35.00    Pack years: 35.00    Types: Cigarettes  . Smokeless tobacco: Never Used  . Tobacco comment: using the patch  Substance Use Topics  . Alcohol use: Yes    Alcohol/week: 27.0 standard drinks    Types: 6 Cans of beer, 21 Standard drinks or equivalent per week    Comment: 6 beers a week   . Drug use: No     Allergies   Bee venom and Lactose intolerance (gi)   Review of Systems Review of Systems   Physical Exam Triage Vital Signs ED Triage Vitals  Enc Vitals Group     BP 07/18/19 1037 (!) 161/83     Pulse Rate 07/18/19 1037 84     Resp 07/18/19 1037 16     Temp 07/18/19 1037 99.5 F (37.5 C)     Temp Source 07/18/19 1037 Oral     SpO2 07/18/19 1037 97 %     Weight --      Height --      Head Circumference --       Peak Flow --      Pain Score 07/18/19 1034 9     Pain Loc --      Pain Edu? --      Excl. in [place]? --    No data found.  Updated Vital Signs BP (!) 161/83 (  BP Location: Left Arm)   Pulse 84   Temp 99.5 F (37.5 C) (Oral)   Resp 16   SpO2 97%   Visual Acuity Right Eye Distance:   Left Eye Distance:   Bilateral Distance:    Right Eye Near:   Left Eye Near:    Bilateral Near:     Physical Exam Vitals signs and nursing note reviewed.  Constitutional:      General: He is not in acute distress.    Appearance: Normal appearance. He is not ill-appearing, toxic-appearing or diaphoretic.  HENT:     Head: Normocephalic and atraumatic.     Nose: Nose normal.  Eyes:     Conjunctiva/sclera: Conjunctivae normal.  Neck:     Musculoskeletal: Normal range of motion.  Cardiovascular:     Pulses: Normal pulses.     Heart sounds: Normal heart sounds.  Pulmonary:     Effort: Pulmonary effort is normal.     Breath sounds: Normal breath sounds.  Musculoskeletal: Normal range of motion.  Skin:    General: Skin is warm and dry.     Comments: Wound to RLE that has healed. Mild erythema and raise dare that is very small. No pain. No drainage.   Neurological:     Mental Status: He is alert.  Psychiatric:        Mood and Affect: Mood normal.      UC Treatments / Results  Labs (all labs ordered are listed, but only abnormal results are displayed) Labs Reviewed  NOVEL CORONAVIRUS, NAA (HOSP ORDER, SEND-OUT TO REF LAB; TAT 18-24 HRS)    EKG   Radiology No results found.  Procedures Procedures (including critical care time)  Medications Ordered in UC Medications - No data to display  Initial Impression / Assessment and Plan / UC Course  I have reviewed the triage vital signs and the nursing notes.  Pertinent labs & imaging results that were available during my care of the patient were reviewed by me and considered in my medical decision making (see chart for details).      Generalized body aches- suspicious for COVID.  Low grade fever here today.  Will send swab for testing.  Quarantine precautions given  Wound does not appear to be infected.    Final Clinical Impressions(s) / UC Diagnoses   Final diagnoses:  Generalized body aches     Discharge Instructions      There is some suspicion for Covid.  We will send your swab for testing and call you if your results are positive Make sure you are quarantining until you get your results. I do not believe that your leg is infected Keep using the antibiotic ointment as you have been and keep clean and dry Follow up as needed for continued or worsening symptoms     ED Prescriptions    None     PDMP not reviewed this encounter.   Orvan July, NP 07/18/19 1249

## 2019-07-18 NOTE — ED Triage Notes (Signed)
Patient presents to Urgent Care with complaints of joint pain in his wrists  And hips since a few days ago. Patient reports he also thinks he still has a suture in his RLE from when he had a lac in June or July? Patient reports taking them out himself and thinks there is still one remaining. Wound appears red and slightly swollen, not able to visualize the suture.

## 2019-07-18 NOTE — Discharge Instructions (Addendum)
°  There is some suspicion for Covid.  We will send your swab for testing and call you if your results are positive Make sure you are quarantining until you get your results. I do not believe that your leg is infected Keep using the antibiotic ointment as you have been and keep clean and dry Follow up as needed for continued or worsening symptoms

## 2019-07-20 LAB — NOVEL CORONAVIRUS, NAA (HOSP ORDER, SEND-OUT TO REF LAB; TAT 18-24 HRS): SARS-CoV-2, NAA: NOT DETECTED

## 2019-08-11 ENCOUNTER — Other Ambulatory Visit: Payer: Self-pay | Admitting: Nurse Practitioner

## 2019-08-11 DIAGNOSIS — Z09 Encounter for follow-up examination after completed treatment for conditions other than malignant neoplasm: Secondary | ICD-10-CM

## 2019-08-11 DIAGNOSIS — K219 Gastro-esophageal reflux disease without esophagitis: Secondary | ICD-10-CM

## 2019-08-11 MED FILL — ?ATORVASTATIN 40MG TABLET: 40 | 30 days supply | Qty: 30 | Fill #3

## 2019-08-11 MED FILL — DICLOFENAC SODIUM 1% GEL: 1 | 12 days supply | Qty: 100 | Fill #1

## 2019-08-11 MED FILL — INVOKANA 300 MG TABLET: 300 | 30 days supply | Qty: 30 | Fill #1

## 2019-08-11 MED FILL — LOSARTAN POTASSIUM 100 MG T: 100 | 30 days supply | Qty: 30 | Fill #3

## 2019-08-14 MED FILL — DULoxetine HCL 30 MG CPEP: 30 | 30 days supply | Qty: 30 | Fill #0

## 2019-08-14 MED FILL — OMEPRAZOLE DR 40 MG CAPSULE: 40 | 30 days supply | Qty: 30 | Fill #0

## 2019-09-14 ENCOUNTER — Other Ambulatory Visit: Payer: Self-pay | Admitting: Nurse Practitioner

## 2019-09-14 DIAGNOSIS — R21 Rash and other nonspecific skin eruption: Secondary | ICD-10-CM

## 2019-09-14 MED FILL — DULoxetine HCL 30 MG CPEP: 30 | 30 days supply | Qty: 30 | Fill #1

## 2019-09-14 MED FILL — INVOKANA 300 MG TABLET: 300 | 30 days supply | Qty: 30 | Fill #2

## 2019-09-14 MED FILL — OMEPRAZOLE DR 40 MG CAPSULE: 40 | 30 days supply | Qty: 30 | Fill #1

## 2019-09-14 MED FILL — ATORVASTATIN CALCIUM 40 MG: 40 | 30 days supply | Qty: 30 | Fill #4

## 2019-09-14 MED FILL — LOSARTAN POTASSIUM 100 MG T: 100 | 30 days supply | Qty: 30 | Fill #4

## 2019-09-14 MED FILL — TRIAMCINOLONE 0.025% OINT: 0.025 | 15 days supply | Qty: 30 | Fill #0

## 2019-09-14 MED FILL — ?AMLODIPINE BESYLATE 10 MG: 10 | 30 days supply | Qty: 30 | Fill #1

## 2019-09-14 MED FILL — CYCLOBENZAPRINE 5 MG TABLET: 5 | 20 days supply | Qty: 60 | Fill #1

## 2019-09-18 ENCOUNTER — Ambulatory Visit: Payer: Self-pay | Attending: Nurse Practitioner | Admitting: Nurse Practitioner

## 2019-09-18 ENCOUNTER — Other Ambulatory Visit: Payer: Self-pay

## 2019-09-18 ENCOUNTER — Encounter: Payer: Self-pay | Admitting: Nurse Practitioner

## 2019-09-18 DIAGNOSIS — E1165 Type 2 diabetes mellitus with hyperglycemia: Secondary | ICD-10-CM

## 2019-09-18 DIAGNOSIS — IMO0002 Reserved for concepts with insufficient information to code with codable children: Secondary | ICD-10-CM

## 2019-09-18 DIAGNOSIS — F17210 Nicotine dependence, cigarettes, uncomplicated: Secondary | ICD-10-CM

## 2019-09-18 DIAGNOSIS — I10 Essential (primary) hypertension: Secondary | ICD-10-CM

## 2019-09-18 DIAGNOSIS — R7989 Other specified abnormal findings of blood chemistry: Secondary | ICD-10-CM

## 2019-09-18 DIAGNOSIS — F10982 Alcohol use, unspecified with alcohol-induced sleep disorder: Secondary | ICD-10-CM

## 2019-09-18 DIAGNOSIS — E1140 Type 2 diabetes mellitus with diabetic neuropathy, unspecified: Secondary | ICD-10-CM

## 2019-09-18 DIAGNOSIS — F17200 Nicotine dependence, unspecified, uncomplicated: Secondary | ICD-10-CM

## 2019-09-18 MED ORDER — MIRTAZAPINE 15 MG PO TABS: 15.0000 mg | ORAL_TABLET | Freq: Every day | ORAL | 3 refills | Status: DC

## 2019-09-18 MED ORDER — NICOTINE 21 MG/24HR TD PT24: 21.0000 mg | MEDICATED_PATCH | Freq: Every day | TRANSDERMAL | 0 refills | Status: AC

## 2019-09-18 MED ORDER — CANAGLIFLOZIN 300 MG PO TABS: 300.0000 mg | ORAL_TABLET | Freq: Every day | ORAL | 1 refills | Status: DC

## 2019-09-18 MED ORDER — NICOTINE 14 MG/24HR TD PT24: 14.0000 mg | MEDICATED_PATCH | Freq: Every day | TRANSDERMAL | 0 refills | Status: AC

## 2019-09-18 MED ORDER — NICOTINE 7 MG/24HR TD PT24: 7.0000 mg | MEDICATED_PATCH | Freq: Every day | TRANSDERMAL | 0 refills | Status: AC

## 2019-09-18 MED FILL — ?MIRTAZAPINE 15 MG TABLET: 15 | 30 days supply | Qty: 30 | Fill #0

## 2019-09-18 MED FILL — NICOTINE 21 MG/24HR PATCH: 21 | 28 days supply | Qty: 28 | Fill #0

## 2019-09-18 NOTE — Progress Notes (Signed)
Virtual Visit via Telephone Note Due to national recommendations of social distancing due to [place] 19, telehealth visit is felt to be most appropriate for this patient at this time.  I discussed the limitations, risks, security and privacy concerns of performing an evaluation and management service by telephone and the availability of in person appointments. I also discussed with the patient that there may be a patient responsible charge related to this service. The patient expressed understanding and agreed to proceed.    I connected with [name] [name] on 09/18/19  at   9:10 AM EST  EDT by telephone and verified that I am speaking with the correct person using two identifiers.   Consent I discussed the limitations, risks, security and privacy concerns of performing an evaluation and management service by telephone and the availability of in person appointments. I also discussed with the patient that there may be a patient responsible charge related to this service. The patient expressed understanding and agreed to proceed.   Location of Patient: Private  Residence   Location of Provider: [place] [facility] [employer] Office    Persons participating in Telemedicine visit: [name] FNP-BC [place]    History of Present Illness: Telemedicine visit for: F/U  has a past medical history of Barrett's esophagus (dx 2016), Bronchitis, Depression, Diabetes ([place]), ED (erectile dysfunction), GERD (gastroesophageal reflux disease), Hepatitis C, Hiatal hernia (10/2014), and Neuropathy.   At his previous office visit on 06-16-2019 he requested that I remove 2 stitches from his lower right leg. He had a laceration which required stitches from the Urgent care on May 08, 2019.  They had instructed him to return and 10 to 14 days for stitch removal however unfortunately patient did not return and decided to remove stitches himself.  There are 2 stitches remaining and I  have instructed patient that I am unable to take the stitches out due to the length of time that they have been in.  He was deferred back to the urgent care for removal. There are currently no signs of skin infection.  Today he reports he still has not returned to the urgent care for removal of the stitches.  He does endorse some purulent drainage from the area.  I have instructed him that it is very important with his diabetes that he have the stitches removed as soon as possible and he verbalized understanding.    DM TYPE 2 A1c is not at goal of less than 7.  He is not consistently diet adherent and endorses drinking regular soda (one soda daily), cereal, milk, Sunny D (12 oz per day) Getting in 2-4 16oz water bottles daily.  He is knowledgeable regarding foods and drinks that will increase his glucose levels.  Current medications include Invokana 300 mg daily.  Hyperglycemic symptoms include peripheral neuropathy. Lab Results  Component Value Date   HGBA1C 8.5 (A) 06/16/2019   Essential Hypertension Not well controlled.  He does not monitor his blood pressure at home but does have a manual device there.  Current medications include amlodipine 10 mg daily and losartan 100 mg daily.  He does not endorse any chest pain, shortness of breath, palpitations, lightheadedness, dizziness, headaches or BLE edema.  BP Readings from Last 3 Encounters:  07/18/19 (!) 161/83  06/16/19 (!) 147/78  05/08/19 125/65   Testosterone Deficiency He has a history of testosterone deficiency and is endorsing erectile dysfunction and increased weakness.  Requesting to have his testosterone checked.  He was previously self administering testosterone injections.     Past Medical History:  Diagnosis Date  . Barrett's esophagus dx 2016  . Bronchitis   . Depression   . Diabetes ([place])   . ED (erectile dysfunction)   . GERD (gastroesophageal reflux disease)   . Hepatitis C   . Hiatal hernia 10/2014   3cm  .  Neuropathy     Past Surgical History:  Procedure Laterality Date  . APPENDECTOMY    . COLONOSCOPY N/A 02/16/2014   Procedure: COLONOSCOPY;  Surgeon: Gatha Mayer, MD;  Location: [facility];  Service: Endoscopy;  Laterality: N/A;  . COLONOSCOPY    . TONSILLECTOMY      Family History  Problem Relation Age of Onset  . Cancer Mother        breast  . Diabetes Mother   . Heart disease Maternal Grandfather   . Pancreatic cancer Paternal Grandmother   . Stroke Father   . Alcohol abuse Father   . Pancreatic cancer Father   . Colon cancer Neg Hx   . Stomach cancer Neg Hx   . Rectal cancer Neg Hx   . Esophageal cancer Neg Hx     Social History   Socioeconomic History  . Marital status: Single    Spouse name: Not on file  . Number of children: Not on file  . Years of education: Not on file  . Highest education level: Not on file  Occupational History  . Not on file  Tobacco Use  . Smoking status: Current Every Day Smoker    Packs/day: 1.00    Years: 35.00    Pack years: 35.00    Types: Cigarettes  . Smokeless tobacco: Never Used  . Tobacco comment: using the patch  Substance and Sexual Activity  . Alcohol use: Yes    Alcohol/week: 27.0 standard drinks    Types: 6 Cans of beer, 21 Standard drinks or equivalent per week    Comment: 6 beers a week   . Drug use: No  . Sexual activity: Not Currently  Other Topics Concern  . Not on file  Social History Narrative  . Not on file   Social Determinants of Health   Financial Resource Strain:   . Difficulty of Paying Living Expenses: Not on file  Food Insecurity:   . Worried About Charity fundraiser in the Last Year: Not on file  . Ran Out of Food in the Last Year: Not on file  Transportation Needs:   . Lack of Transportation (Medical): Not on file  . Lack of Transportation (Non-Medical): Not on file  Physical Activity:   . Days of Exercise per Week: Not on file  . Minutes of Exercise per Session: Not on file  Stress:    . Feeling of Stress : Not on file  Social Connections:   . Frequency of Communication with Friends and Family: Not on file  . Frequency of Social Gatherings with Friends and Family: Not on file  . Attends Religious Services: Not on file  . Active Member of Clubs or Organizations: Not on file  . Attends Archivist Meetings: Not on file  . Marital Status: Not on file     Observations/Objective: Awake, alert and oriented x 3   Review of Systems  Constitutional: Negative for fever, malaise/fatigue and weight loss.  HENT: Negative.  Negative for nosebleeds.   Eyes: Negative.  Negative for blurred vision, double vision and photophobia.  Respiratory: Negative.  Negative for cough and shortness of breath.   Cardiovascular: Negative.  Negative for chest pain, palpitations and leg swelling.  Gastrointestinal: Negative.  Negative for heartburn, nausea and vomiting.  Genitourinary:       Erectile dysfunction  Musculoskeletal: Negative.  Negative for myalgias.  Neurological: Positive for weakness. Negative for dizziness, focal weakness, seizures and headaches.  Psychiatric/Behavioral: Negative for suicidal ideas. The patient has insomnia.     Assessment and Plan: [name] [name] was seen today for follow-up.  Diagnoses and all orders for this visit:  Tobacco dependence 1. [name] [name] continues to smoke 1 pack of cigarettes per day. 2. [name] [name] was counseled on the dangers of tobacco use, and was advised to quit. We reviewed specific strategies to maximize success, including removing cigarettes and smoking materials from environment, stress management and support of family/friends as well as pharmacological alternatives. 3. A total of 4 minutes was spent on counseling for smoking cessation and [name] [name] is ready to quit and has chosen NICOTINE PATCHES to start today.  4. [name] [name] was offered Wellbutrin, Chantix, Nicotine patch, Nicotine gum or lozenges.  Due to out of pocket costs [name] [name] was also given  smoking cessation support and advised to contact: the Smoking Cessation hotline: 1-800-QUIT-NOW.  [name] [name] was also informed of our Smoking cessation classes which are also available through [facility] by calling [phone] or visit our website at [email or web address]  5. Will follow up at next scheduled office visit.    Uncontrolled diabetes mellitus with diabetic neuropathy (HCC) -     canagliflozin (INVOKANA) 300 MG TABS tablet; Take 1 tablet (300 mg total) by mouth daily before breakfast. Diabetes is poorly controlled. Advised patient to keep a fasting blood sugar log fast, 2 hours post lunch and bedtime which will be reviewed at the next office visit.  Essential hypertension Continue all antihypertensives as prescribed.  Remember to bring in your blood pressure log with you for your follow up appointment.  DASH/Mediterranean Diets are healthier choices for HTN.    Tobacco use disorder -     nicotine (NICODERM CQ - DOSED IN MG/24 HOURS) 21 mg/24hr patch; Place 1 patch (21 mg total) onto the skin daily. -     nicotine (NICODERM CQ - DOSED IN MG/24 HOURS) 14 mg/24hr patch; Place 1 patch (14 mg total) onto the skin daily for 28 days. -     nicotine (NICODERM CQ - DOSED IN MG/24 HR) 7 mg/24hr patch; Place 1 patch (7 mg total) onto the skin daily for 28 days.  Alcohol-induced insomnia (HCC) -     mirtazapine (REMERON) 15 MG tablet; Take 1 tablet (15 mg total) by mouth at bedtime.     Follow Up Instructions No follow-ups on file.     I discussed the assessment and treatment plan with the patient. The patient was provided an opportunity to ask questions and all were answered. The patient agreed with the plan and demonstrated an understanding of the instructions.   The patient was advised to call back or seek an in-person evaluation if the symptoms worsen or if the condition fails to improve as anticipated.  I provided 19 minutes of non-face-to-face time during  this encounter including median intraservice time, reviewing previous notes, labs, imaging, medications and explaining diagnosis and management.  [name], FNP-BC

## 2019-09-20 ENCOUNTER — Other Ambulatory Visit: Payer: Self-pay

## 2019-09-20 ENCOUNTER — Ambulatory Visit: Payer: Self-pay | Attending: Nurse Practitioner

## 2019-09-20 DIAGNOSIS — I10 Essential (primary) hypertension: Secondary | ICD-10-CM

## 2019-09-20 DIAGNOSIS — E1140 Type 2 diabetes mellitus with diabetic neuropathy, unspecified: Secondary | ICD-10-CM

## 2019-09-20 DIAGNOSIS — IMO0002 Reserved for concepts with insufficient information to code with codable children: Secondary | ICD-10-CM

## 2019-09-20 DIAGNOSIS — R7989 Other specified abnormal findings of blood chemistry: Secondary | ICD-10-CM

## 2019-09-21 LAB — CMP14+EGFR
ALT: 112 IU/L — ABNORMAL HIGH (ref 0–44)
AST: 109 IU/L — ABNORMAL HIGH (ref 0–40)
Albumin/Globulin Ratio: 1.3 (ref 1.2–2.2)
Albumin: 4.6 g/dL (ref 3.8–4.9)
Alkaline Phosphatase: 105 IU/L (ref 39–117)
BUN/Creatinine Ratio: 24 — ABNORMAL HIGH (ref 9–20)
BUN: 15 mg/dL (ref 6–24)
Bilirubin Total: 0.3 mg/dL (ref 0.0–1.2)
CO2: 23 mmol/L (ref 20–29)
Calcium: 10 mg/dL (ref 8.7–10.2)
Chloride: 96 mmol/L (ref 96–106)
Creatinine, Ser: 0.63 mg/dL — ABNORMAL LOW (ref 0.76–1.27)
GFR calc Af Amer: 126 mL/min/{1.73_m2} (ref >59)
GFR calc non Af Amer: 109 mL/min/{1.73_m2} (ref >59)
Globulin, Total: 3.6 g/dL (ref 1.5–4.5)
Glucose: 291 mg/dL — ABNORMAL HIGH (ref 65–99)
Potassium: 4.5 mmol/L (ref 3.5–5.2)
Sodium: 133 mmol/L — ABNORMAL LOW (ref 134–144)
Total Protein: 8.2 g/dL (ref 6.0–8.5)

## 2019-09-21 LAB — HEMOGLOBIN A1C
Est. average glucose Bld gHb Est-mCnc: 194 mg/dL
Hgb A1c MFr Bld: 8.4 % — ABNORMAL HIGH (ref 4.8–5.6)

## 2019-09-21 LAB — CBC
Hematocrit: 45.2 % (ref 37.5–51.0)
Hemoglobin: 15.3 g/dL (ref 13.0–17.7)
MCH: 32.2 pg (ref 26.6–33.0)
MCHC: 33.8 g/dL (ref 31.5–35.7)
MCV: 95 fL (ref 79–97)
Platelets: 150 10*3/uL (ref 150–450)
RBC: 4.75 x10E6/uL (ref 4.14–5.80)
RDW: 12.4 % (ref 11.6–15.4)
WBC: 7.7 10*3/uL (ref 3.4–10.8)

## 2019-09-21 LAB — TESTOSTERONE: Testosterone: 171 ng/dL — ABNORMAL LOW (ref 264–916)

## 2019-09-27 ENCOUNTER — Ambulatory Visit (HOSPITAL_COMMUNITY)
Admission: EM | Admit: 2019-09-27 | Discharge: 2019-09-27 | Disposition: A | Discharge status: Home / Self Care | Payer: Self-pay

## 2019-09-27 ENCOUNTER — Encounter (HOSPITAL_COMMUNITY): Payer: Self-pay

## 2019-09-27 ENCOUNTER — Other Ambulatory Visit: Payer: Self-pay

## 2019-09-27 DIAGNOSIS — Z4802 Encounter for removal of sutures: Secondary | ICD-10-CM

## 2019-09-27 NOTE — ED Provider Notes (Signed)
[place]   MRN: [id number] DOB: [birth year]  Subjective:   [name] [name] is a 59 y.o. male presenting for suture removal.  Patient had laceration repair in September 2020.  He thought all sutures were removed but recently saw a knot and the suture popped up out of his wound.  He has tried removing this himself but has been very unsuccessful.  Denies fever drainage of pus or active bleeding.  No current facility-administered medications for this encounter.  Current Outpatient Medications:  .  albuterol (VENTOLIN HFA) 108 (90 Base) MCG/ACT inhaler, Inhale 2 puffs into the lungs every 6 (six) hours as needed for wheezing or shortness of breath., Disp: 18 g, Rfl: 1 .  amLODipine (NORVASC) 10 MG tablet, Take 1 tablet (10 mg total) by mouth daily., Disp: 90 tablet, Rfl: 1 .  aspirin EC 81 MG tablet, Take 1 tablet (81 mg total) by mouth daily., Disp: 30 tablet, Rfl: 11 .  atorvastatin (LIPITOR) 40 MG tablet, Take 1 tablet (40 mg total) by mouth daily., Disp: 90 tablet, Rfl: 1 .  canagliflozin (INVOKANA) 300 MG TABS tablet, Take 1 tablet (300 mg total) by mouth daily before breakfast., Disp: 90 tablet, Rfl: 1 .  cyclobenzaprine (FLEXERIL) 5 MG tablet, TAKE 1 TABLET (5 MG TOTAL) BY MOUTH 3 (THREE) TIMES DAILY AS NEEDED FOR MUSCLE SPASMS., Disp: 60 tablet, Rfl: 1 .  DULoxetine (CYMBALTA) 30 MG capsule, TAKE 1 CAPSULE (30 MG TOTAL) BY MOUTH DAILY., Disp: 30 capsule, Rfl: 3 .  loperamide (IMODIUM A-D) 2 MG tablet, Take 1 tablet (2 mg total) by mouth 4 (four) times daily as needed for diarrhea or loose stools., Disp: 60 tablet, Rfl: 0 .  losartan (COZAAR) 100 MG tablet, Take 1 tablet (100 mg total) by mouth daily., Disp: 90 tablet, Rfl: 1 .  mirtazapine (REMERON) 15 MG tablet, Take 1 tablet (15 mg total) by mouth at bedtime., Disp: 30 tablet, Rfl: 3 .  Multiple Vitamins-Minerals (MULTIVITAMIN ADULT) TABS, Take 1 tablet by mouth daily., Disp: 30 tablet, Rfl: 11 .  mupirocin ointment  (BACTROBAN) 2 %, Apply to affected areas twice per day. Patient will pick up scripts today., Disp: 60 g, Rfl: 1 .  [START ON 10/31/2019] nicotine (NICODERM CQ - DOSED IN MG/24 HOURS) 14 mg/24hr patch, Place 1 patch (14 mg total) onto the skin daily for 28 days., Disp: 28 patch, Rfl: 0 .  nicotine (NICODERM CQ - DOSED IN MG/24 HOURS) 21 mg/24hr patch, Place 1 patch (21 mg total) onto the skin daily., Disp: 42 patch, Rfl: 0 .  [START ON 11/30/2019] nicotine (NICODERM CQ - DOSED IN MG/24 HR) 7 mg/24hr patch, Place 1 patch (7 mg total) onto the skin daily for 28 days., Disp: 28 patch, Rfl: 0 .  omeprazole (PRILOSEC) 40 MG capsule, TAKE 1 CAPSULE (40 MG TOTAL) BY MOUTH DAILY., Disp: 30 capsule, Rfl: 1 .  ondansetron (ZOFRAN ODT) 4 MG disintegrating tablet, Take 1 tablet (4 mg total) by mouth every 8 (eight) hours as needed for nausea or vomiting., Disp: 20 tablet, Rfl: 0 .  PARoxetine (PAXIL) 10 MG tablet, Take 1 tablet (10 mg total) by mouth daily., Disp: 90 tablet, Rfl: 1 .  triamcinolone (KENALOG) 0.025 % cream, APPLY 1 APPLICATION TOPICALLY 2 (TWO) TIMES DAILY., Disp: 30 g, Rfl: 0   Allergies  Allergen Reactions  . Bee Venom Anaphylaxis  . Lactose Intolerance (Gi) Other (See Comments)    GI upset    Past Medical History:  Diagnosis Date  .  Barrett's esophagus dx 2016  . Bronchitis   . Depression   . Diabetes ([place])   . ED (erectile dysfunction)   . GERD (gastroesophageal reflux disease)   . Hepatitis C   . Hiatal hernia 10/2014   3cm  . Neuropathy      Past Surgical History:  Procedure Laterality Date  . APPENDECTOMY    . COLONOSCOPY N/A 02/16/2014   Procedure: COLONOSCOPY;  Surgeon: Gatha Mayer, MD;  Location: [facility];  Service: Endoscopy;  Laterality: N/A;  . COLONOSCOPY    . TONSILLECTOMY      Family History  Problem Relation Age of Onset  . Cancer Mother        breast  . Diabetes Mother   . Heart disease Maternal Grandfather   . Pancreatic cancer Paternal Grandmother    . Stroke Father   . Alcohol abuse Father   . Pancreatic cancer Father   . Colon cancer Neg Hx   . Stomach cancer Neg Hx   . Rectal cancer Neg Hx   . Esophageal cancer Neg Hx     Social History   Tobacco Use  . Smoking status: Current Every Day Smoker    Packs/day: 1.00    Years: 35.00    Pack years: 35.00    Types: Cigarettes  . Smokeless tobacco: Never Used  . Tobacco comment: using the patch  Substance Use Topics  . Alcohol use: Yes    Alcohol/week: 27.0 standard drinks    Types: 6 Cans of beer, 21 Standard drinks or equivalent per week    Comment: 6 beers a week   . Drug use: No    ROS   Objective:   Vitals: BP (!) 156/92 (BP Location: Right Arm)   Pulse 81   Temp 100.1 F (37.8 C) (Oral)   Resp 18   Wt 180 lb (81.6 kg)   SpO2 100%   BMI 25.10 kg/m   Physical Exam Constitutional:      General: He is not in acute distress.    Appearance: Normal appearance. He is well-developed and normal weight. He is not ill-appearing, toxic-appearing or diaphoretic.  HENT:     Head: Normocephalic and atraumatic.     Right Ear: External ear normal.     Left Ear: External ear normal.     Nose: Nose normal.     Mouth/Throat:     Pharynx: Oropharynx is clear.  Eyes:     General: No scleral icterus.       Right eye: No discharge.        Left eye: No discharge.     Extraocular Movements: Extraocular movements intact.     Pupils: Pupils are equal, round, and reactive to light.  Cardiovascular:     Rate and Rhythm: Normal rate.  Pulmonary:     Effort: Pulmonary effort is normal.  Musculoskeletal:     Cervical back: Normal range of motion.       Legs:  Neurological:     Mental Status: He is alert and oriented to person, place, and time.  Psychiatric:        Mood and Affect: Mood normal.        Behavior: Behavior normal.        Thought Content: Thought content normal.        Judgment: Judgment normal.    Adson forceps used to remove sole suture. It was removed  in its entirety without incident.   Assessment and Plan :  1. Visit for suture removal    Anticipatory guidance provided. RTC precautions reviewed.    Jaynee Eagles, [place] 09/27/19 1611

## 2019-09-27 NOTE — ED Triage Notes (Signed)
Pt states he has a suture in his right lower leg that's been there sense last sept. 2020. Pt states he took them out and thought he got them all.

## 2019-10-16 ENCOUNTER — Ambulatory Visit: Payer: Self-pay

## 2019-10-16 ENCOUNTER — Other Ambulatory Visit: Payer: Self-pay | Admitting: Nurse Practitioner

## 2019-10-16 ENCOUNTER — Ambulatory Visit: Payer: Self-pay | Attending: Nurse Practitioner

## 2019-10-16 ENCOUNTER — Other Ambulatory Visit: Payer: Self-pay

## 2019-10-16 DIAGNOSIS — E871 Hypo-osmolality and hyponatremia: Secondary | ICD-10-CM

## 2019-10-16 DIAGNOSIS — E291 Testicular hypofunction: Secondary | ICD-10-CM

## 2019-10-17 LAB — BASIC METABOLIC PANEL
BUN/Creatinine Ratio: 15 (ref 9–20)
BUN: 9 mg/dL (ref 6–24)
CO2: 21 mmol/L (ref 20–29)
Calcium: 9.5 mg/dL (ref 8.7–10.2)
Chloride: 94 mmol/L — ABNORMAL LOW (ref 96–106)
Creatinine, Ser: 0.61 mg/dL — ABNORMAL LOW (ref 0.76–1.27)
GFR calc Af Amer: 128 mL/min/{1.73_m2} (ref >59)
GFR calc non Af Amer: 111 mL/min/{1.73_m2} (ref >59)
Glucose: 213 mg/dL — ABNORMAL HIGH (ref 65–99)
Potassium: 4.3 mmol/L (ref 3.5–5.2)
Sodium: 135 mmol/L (ref 134–144)

## 2019-10-17 LAB — TESTOSTERONE: Testosterone: 181 ng/dL — ABNORMAL LOW (ref 264–916)

## 2019-10-19 ENCOUNTER — Other Ambulatory Visit: Payer: Self-pay | Admitting: Family Medicine

## 2019-10-19 ENCOUNTER — Other Ambulatory Visit: Payer: Self-pay | Admitting: Nurse Practitioner

## 2019-10-19 DIAGNOSIS — Z09 Encounter for follow-up examination after completed treatment for conditions other than malignant neoplasm: Secondary | ICD-10-CM

## 2019-10-19 DIAGNOSIS — K219 Gastro-esophageal reflux disease without esophagitis: Secondary | ICD-10-CM

## 2019-10-19 MED FILL — INVOKANA 300 MG TABLET: 300 | 30 days supply | Qty: 30 | Fill #3

## 2019-10-19 MED FILL — ?ATORVASTATIN 40MG TABLET: 40 | 30 days supply | Qty: 30 | Fill #5

## 2019-10-19 MED FILL — CYCLOBENZAPRINE 5 MG TABLET: 5 | 20 days supply | Qty: 60 | Fill #0

## 2019-10-19 MED FILL — PAROXETINE HCL 10 MG TABS: 10 | 30 days supply | Qty: 30 | Fill #2

## 2019-10-19 MED FILL — LOSARTAN POTASSIUM 100 MG T: 100 | 30 days supply | Qty: 30 | Fill #5

## 2019-10-19 MED FILL — ?AMLODIPINE BESYL 10MG TABL: 10 | 30 days supply | Qty: 30 | Fill #2

## 2019-10-19 MED FILL — DULoxetine HCL 30 MG CPEP: 30 | 30 days supply | Qty: 30 | Fill #2

## 2019-10-19 MED FILL — ?MIRTAZAPINE 15 MG TABLET: 15 | 30 days supply | Qty: 30 | Fill #1

## 2019-10-19 MED FILL — OMEPRAZOLE DR 40 MG CAPSULE: 40 | 30 days supply | Qty: 30 | Fill #0

## 2019-10-20 ENCOUNTER — Other Ambulatory Visit: Payer: Self-pay | Admitting: Nurse Practitioner

## 2019-10-20 MED ORDER — TESTOSTERONE CYPIONATE 200 MG/ML IM SOLN: 200.0000 mg | INTRAMUSCULAR | 3 refills | Status: DC

## 2019-10-25 ENCOUNTER — Telehealth: Payer: Self-pay

## 2019-10-25 NOTE — Telephone Encounter (Signed)
There is not a patient assistance program available that I am aware of for pt's Testosterone.  His enrollment in [employer] program ended January of 2020 and they discontinued the program.  Pt. Has requested that the script be sent to [facility] in [facility].  We will void the script her at [place].

## 2019-10-26 ENCOUNTER — Other Ambulatory Visit: Payer: Self-pay | Admitting: Nurse Practitioner

## 2019-10-26 MED ORDER — TESTOSTERONE CYPIONATE 200 MG/ML IM SOLN: 200.0000 mg | INTRAMUSCULAR | 3 refills | Status: DC

## 2019-10-26 NOTE — Telephone Encounter (Signed)
Thanks Kelly.

## 2019-10-30 ENCOUNTER — Other Ambulatory Visit: Payer: Self-pay

## 2019-10-30 ENCOUNTER — Ambulatory Visit: Payer: Self-pay

## 2019-10-30 ENCOUNTER — Ambulatory Visit: Payer: Self-pay | Attending: Nurse Practitioner

## 2019-10-30 ENCOUNTER — Other Ambulatory Visit: Payer: Self-pay | Admitting: Nurse Practitioner

## 2019-10-30 VITALS — Temp 98.0°F

## 2019-10-30 DIAGNOSIS — E291 Testicular hypofunction: Secondary | ICD-10-CM

## 2019-10-30 MED ORDER — TESTOSTERONE CYPIONATE 100 MG/ML IM SOLN: 200.0000 mg | INTRAMUSCULAR | Status: DC

## 2019-10-30 MED ORDER — TESTOSTERONE CYPIONATE 200 MG/ML IM SOLN
200.0000 mg | INTRAMUSCULAR | Status: DC
  Administered 2019-10-30 – 2019-12-11 (×4): 200 mg via INTRAMUSCULAR

## 2019-10-30 NOTE — Progress Notes (Signed)
Name and DOB verified Pt is here for Testosterone injection as instructed by PCP'"Testosterone levels are low. Will need to schdule for injections every 2 weeks"  Denies any acute viral illness. No distress noted T 98.0 F  Testosterone Cyponate 1 ML (200mg /ml) administered as per NP Zelda standing order every 14 days.  Injection was administered via IM in R  Gluteus medius muscle. Well tolerated. No reaction noted at the injection site  Scheduled next injection on 11/07/2019. Verbalized understanding [id number] Lot [id number] Exp 02/2021

## 2019-11-13 ENCOUNTER — Other Ambulatory Visit: Payer: Self-pay

## 2019-11-13 ENCOUNTER — Ambulatory Visit: Payer: Self-pay | Attending: Nurse Practitioner

## 2019-11-13 VITALS — Temp 98.2°F

## 2019-11-13 DIAGNOSIS — E291 Testicular hypofunction: Secondary | ICD-10-CM

## 2019-11-13 MED FILL — MIRTAZAPINE 15 MG TABLET: 15 | 30 days supply | Qty: 30 | Fill #2

## 2019-11-13 NOTE — Progress Notes (Signed)
Patient is here today as instructed by the PCP for his testosterone injections.Order was verified with the provider inject via IM 200 mg/mL  every 14 days. DOB and name verified. No signs of distress noted. Verbalized no previous SE. Minor  Soreness described.  Injection administered in L ventrogluteal muscle.Discused with patient possible SE or AR . verbalized understanding. Well tolerated/ no reaction noted at the injection site /made pt aware to Schedule next dose in 14 days.

## 2019-11-23 ENCOUNTER — Other Ambulatory Visit: Payer: Self-pay | Admitting: Physician Assistant

## 2019-11-23 DIAGNOSIS — I10 Essential (primary) hypertension: Secondary | ICD-10-CM

## 2019-11-23 DIAGNOSIS — E782 Mixed hyperlipidemia: Secondary | ICD-10-CM

## 2019-11-23 MED FILL — CYCLOBENZAPRINE 5 MG TABLET: 5 | 20 days supply | Qty: 60 | Fill #1

## 2019-11-23 MED FILL — OMEPRAZOLE DR 40 MG CAPSULE: 40 | 30 days supply | Qty: 30 | Fill #1

## 2019-11-23 MED FILL — AMLODIPINE BESYLATE 10 MG T: 10 | 30 days supply | Qty: 30 | Fill #0

## 2019-11-23 MED FILL — LOSARTAN POTASSIUM 100 MG T: 100 | 30 days supply | Qty: 30 | Fill #0

## 2019-11-23 MED FILL — PAROXETINE HCL 10 MG TABS: 10 | 30 days supply | Qty: 30 | Fill #3

## 2019-11-23 MED FILL — MIRTAZAPINE 15 MG TABLET: 15 | 30 days supply | Qty: 30 | Fill #2

## 2019-11-23 MED FILL — ?ATORVASTATIN 40MG TABLET: 40 | 30 days supply | Qty: 30 | Fill #0

## 2019-11-23 MED FILL — INVOKANA 300 MG TABLET: 300 | 30 days supply | Qty: 30 | Fill #4

## 2019-11-23 MED FILL — DULoxetine HCL 30 MG CPEP: 30 | 30 days supply | Qty: 30 | Fill #3

## 2019-11-27 ENCOUNTER — Other Ambulatory Visit: Payer: Self-pay

## 2019-11-27 ENCOUNTER — Ambulatory Visit: Payer: Self-pay | Attending: Nurse Practitioner

## 2019-11-27 DIAGNOSIS — E291 Testicular hypofunction: Secondary | ICD-10-CM

## 2019-11-27 NOTE — Progress Notes (Signed)
Patientishere today as instructed by the PCP for his testosterone injections.Order was verified injection given via IM 200 mg/mL  every 14 days. DOB and name verified. Denies any acute febrile illnesses  No signs of distress noted. Verbalized no previous SE.Minor  Soreness described.  Injection administered inR ventrogluteal muscle.Discused with patient possible SE or AR . verbalized understanding. Well tolerated/ no reaction noted at the injection site /made ptaware to Schedule next dose in 14 days.12/11/2019

## 2019-12-11 ENCOUNTER — Ambulatory Visit: Payer: Self-pay | Attending: Nurse Practitioner

## 2019-12-11 ENCOUNTER — Other Ambulatory Visit: Payer: Self-pay

## 2019-12-11 VITALS — Temp 98.1°F | Resp 18 | Wt 174.0 lb

## 2019-12-11 DIAGNOSIS — E291 Testicular hypofunction: Secondary | ICD-10-CM

## 2019-12-11 NOTE — Progress Notes (Signed)
DOB and Name verified. Patient is here today as instructed by the PCP for his testosterone injections. DOB and name verified. No signs of distress noted. Verbalized no previous SE. Injection administered in L dorsogluteal  muscle.Discused with patient possible SE or AR . verbalized understanding. Well tolerated/ no reaction noted at the injection site /made pt aware to Schedule next dose in 14 days  Lab testosterone drawn today/

## 2019-12-12 LAB — TESTOSTERONE: Testosterone: 756 ng/dL (ref 264–916)

## 2019-12-13 ENCOUNTER — Other Ambulatory Visit: Payer: Self-pay | Admitting: Nurse Practitioner

## 2019-12-19 ENCOUNTER — Encounter: Payer: Self-pay | Admitting: Nurse Practitioner

## 2019-12-19 ENCOUNTER — Other Ambulatory Visit: Payer: Self-pay | Admitting: Family Medicine

## 2019-12-19 ENCOUNTER — Other Ambulatory Visit: Payer: Self-pay

## 2019-12-19 ENCOUNTER — Ambulatory Visit: Payer: Self-pay | Attending: Nurse Practitioner | Admitting: Nurse Practitioner

## 2019-12-19 VITALS — BP 125/71 | HR 98 | Temp 97.7°F | Ht 71.0 in | Wt 176.0 lb

## 2019-12-19 DIAGNOSIS — I10 Essential (primary) hypertension: Secondary | ICD-10-CM

## 2019-12-19 DIAGNOSIS — E1165 Type 2 diabetes mellitus with hyperglycemia: Secondary | ICD-10-CM

## 2019-12-19 DIAGNOSIS — F17200 Nicotine dependence, unspecified, uncomplicated: Secondary | ICD-10-CM

## 2019-12-19 DIAGNOSIS — K219 Gastro-esophageal reflux disease without esophagitis: Secondary | ICD-10-CM

## 2019-12-19 DIAGNOSIS — IMO0002 Reserved for concepts with insufficient information to code with codable children: Secondary | ICD-10-CM

## 2019-12-19 DIAGNOSIS — K2100 Gastro-esophageal reflux disease with esophagitis, without bleeding: Secondary | ICD-10-CM

## 2019-12-19 DIAGNOSIS — E782 Mixed hyperlipidemia: Secondary | ICD-10-CM

## 2019-12-19 DIAGNOSIS — E1140 Type 2 diabetes mellitus with diabetic neuropathy, unspecified: Secondary | ICD-10-CM

## 2019-12-19 DIAGNOSIS — M791 Myalgia, unspecified site: Secondary | ICD-10-CM

## 2019-12-19 DIAGNOSIS — Z09 Encounter for follow-up examination after completed treatment for conditions other than malignant neoplasm: Secondary | ICD-10-CM

## 2019-12-19 LAB — GLUCOSE, POCT (MANUAL RESULT ENTRY): POC Glucose: 201 mg/dl — AB (ref 70–99)

## 2019-12-19 MED ORDER — CYCLOBENZAPRINE HCL 5 MG PO TABS: ORAL_TABLET | ORAL | 1 refills | Status: DC

## 2019-12-19 MED ORDER — ATORVASTATIN CALCIUM 40 MG PO TABS: 40.0000 mg | ORAL_TABLET | Freq: Every day | ORAL | 2 refills | Status: DC

## 2019-12-19 MED ORDER — OMEPRAZOLE 40 MG PO CPDR: 40.0000 mg | DELAYED_RELEASE_CAPSULE | Freq: Every day | ORAL | 1 refills | Status: DC

## 2019-12-19 MED ORDER — LOSARTAN POTASSIUM 100 MG PO TABS: 100.0000 mg | ORAL_TABLET | Freq: Every day | ORAL | 1 refills | Status: DC

## 2019-12-19 MED ORDER — AMLODIPINE BESYLATE 10 MG PO TABS: 10.0000 mg | ORAL_TABLET | Freq: Every day | ORAL | 1 refills | Status: DC

## 2019-12-19 MED FILL — INVOKANA 300 MG TABLET: 300 | 30 days supply | Qty: 30 | Fill #5

## 2019-12-19 MED FILL — AMLODIPINE BESYLATE 10 MG T: 10 | 30 days supply | Qty: 30 | Fill #1

## 2019-12-19 MED FILL — PAROXETINE HCL 10 MG TABS: 10 | 30 days supply | Qty: 30 | Fill #4

## 2019-12-19 MED FILL — OMEPRAZOLE DR 40 MG CAPSULE: 40 | 30 days supply | Qty: 30 | Fill #0

## 2019-12-19 MED FILL — ?ATORVASTATIN 40MG TABLET: 40 | 30 days supply | Qty: 30 | Fill #1

## 2019-12-19 MED FILL — LOSARTAN POTASSIUM 100 MG T: 100 | 30 days supply | Qty: 30 | Fill #1

## 2019-12-19 MED FILL — CYCLOBENZAPRINE 5 MG TABLET: 5 | 20 days supply | Qty: 60 | Fill #0

## 2019-12-19 MED FILL — NICOTINE 14 MG/24HR PATCH: 14 | 28 days supply | Qty: 28 | Fill #0

## 2019-12-19 MED FILL — MIRTAZAPINE 15 MG TABLET: 15 | 30 days supply | Qty: 30 | Fill #3

## 2019-12-19 NOTE — Progress Notes (Signed)
Assessment & Plan:  [name] [name] was seen today for follow-up.  Diagnoses and all orders for this visit:  Uncontrolled diabetes mellitus with diabetic neuropathy (HCC) -     Glucose (CBG) -     Hemoglobin A1c -     Microalbumin/Creatinine Ratio, Urine Continue blood sugar control as discussed in office today, low carbohydrate diet, and regular physical exercise as tolerated, 150 minutes per week (30 min each day, 5 days per week, or 50 min 3 days per week). Keep blood sugar logs with fasting goal of 90-130 mg/dl, post prandial (after you eat) less than 180.  For Hypoglycemia: BS <60 and Hyperglycemia BS >400; contact the clinic ASAP. Annual eye exams and foot exams are recommended.   Mixed hyperlipidemia -     atorvastatin (LIPITOR) 40 MG tablet; Take 1 tablet (40 mg total) by mouth daily. INSTRUCTIONS: Work on a low fat, heart healthy diet and participate in regular aerobic exercise program by working out at least 150 minutes per week; 5 days a week-30 minutes per day. Avoid red meat/beef/steak,  fried foods. junk foods, sodas, sugary drinks, unhealthy snacking, alcohol and smoking.  Drink at least 80 oz of water per day and monitor your carbohydrate intake daily.    Essential hypertension -     amLODipine (NORVASC) 10 MG tablet; Take 1 tablet (10 mg total) by mouth daily. -     losartan (COZAAR) 100 MG tablet; Take 1 tablet (100 mg total) by mouth daily. Continue all antihypertensives as prescribed.  Remember to bring in your blood pressure log with you for your follow up appointment.  DASH/Mediterranean Diets are healthier choices for HTN.   Tobacco dependence [name] [name] was counseled on the dangers of tobacco use, and was advised to quit. Reviewed strategies to maximize success, including removing cigarettes and smoking materials from environment, stress management and support of family/friends as well as pharmacological alternatives including: Wellbutrin, Chantix, Nicotine patch, Nicotine  gum or lozenges. Smoking cessation support: smoking cessation hotline: 1-800-QUIT-NOW.  Smoking cessation classes are also available through [facility]. Call [phone] or visit our website at [email or web address]   A total of 3 minutes was spent on counseling for smoking cessation and [name] [name] is not ready to quit.   Myalgia -     cyclobenzaprine (FLEXERIL) 5 MG tablet; TAKE 1 TABLET (5 MG TOTAL) BY MOUTH 3 (THREE) TIMES DAILY AS NEEDED FOR MUSCLE SPASMS.  Gastroesophageal reflux disease with esophagitis without hemorrhage -     omeprazole (PRILOSEC) 40 MG capsule; Take 1 capsule (40 mg total) by mouth daily. INSTRUCTIONS: Avoid GERD Triggers: acidic, spicy or fried foods, caffeine, coffee, sodas,  alcohol and chocolate.    Patient has been counseled on age-appropriate routine health concerns for screening and prevention. These are reviewed and up-to-date. Referrals have been placed accordingly. Immunizations are up-to-date or declined.    Subjective:   Chief Complaint  Patient presents with  . Follow-up    Pt. is here for diabetes follow up.    HPI [name] [name] 59 y.o. male presents to office today for diabetes check and foot exam.   DM TYPE 2 Not well controlled. He stepped on a screw at work. Requesting that I examine his foot today. Currently prescribed invokana 300 mg daily.  Taking as prescribed. Taking ARB and STATIN. LDL nearing goal of <70.  Hyperglycemic symptoms include peripheral neuropathy.  Lab Results  Component Value Date   HGBA1C 8.4 (H) 09/20/2019   Lab Results  Component Value Date   LDLCALC 72 06/16/2019   Essential Hypertension Well controlled today. Currently taking losartan 100 mg daily and amlodipine 10 mg daily. Denies chest pain, shortness of breath, palpitations, lightheadedness, dizziness, headaches or BLE edema.  BP Readings from Last 3 Encounters:  12/19/19 125/71  09/27/19 (!) 156/92  07/18/19 (!) 161/83    Review  of Systems  Constitutional: Negative for fever, malaise/fatigue and weight loss.  HENT: Negative.  Negative for nosebleeds.   Eyes: Negative.  Negative for blurred vision, double vision and photophobia.  Respiratory: Negative.  Negative for cough and shortness of breath.   Cardiovascular: Negative.  Negative for chest pain, palpitations and leg swelling.  Gastrointestinal: Positive for heartburn. Negative for nausea and vomiting.  Musculoskeletal: Positive for back pain and myalgias.  Neurological: Negative.  Negative for dizziness, focal weakness, seizures and headaches.  Psychiatric/Behavioral: Negative.  Negative for suicidal ideas.    Past Medical History:  Diagnosis Date  . Barrett's esophagus dx 2016  . Bronchitis   . Depression   . Diabetes ([place])   . ED (erectile dysfunction)   . GERD (gastroesophageal reflux disease)   . Hepatitis C   . Hiatal hernia 10/2014   3cm  . Neuropathy     Past Surgical History:  Procedure Laterality Date  . APPENDECTOMY    . COLONOSCOPY N/A 02/16/2014   Procedure: COLONOSCOPY;  Surgeon: Gatha Mayer, MD;  Location: [facility];  Service: Endoscopy;  Laterality: N/A;  . COLONOSCOPY    . TONSILLECTOMY      Family History  Problem Relation Age of Onset  . Cancer Mother        breast  . Diabetes Mother   . Heart disease Maternal Grandfather   . Pancreatic cancer Paternal Grandmother   . Stroke Father   . Alcohol abuse Father   . Pancreatic cancer Father   . Colon cancer Neg Hx   . Stomach cancer Neg Hx   . Rectal cancer Neg Hx   . Esophageal cancer Neg Hx     Social History Reviewed with no changes to be made today.   Outpatient Medications Prior to Visit  Medication Sig Dispense Refill  . albuterol (VENTOLIN HFA) 108 (90 Base) MCG/ACT inhaler Inhale 2 puffs into the lungs every 6 (six) hours as needed for wheezing or shortness of breath. 18 g 1  . aspirin EC 81 MG tablet Take 1 tablet (81 mg total) by mouth daily. 30 tablet 11   . canagliflozin (INVOKANA) 300 MG TABS tablet Take 1 tablet (300 mg total) by mouth daily before breakfast. 90 tablet 1  . DULoxetine (CYMBALTA) 30 MG capsule TAKE 1 CAPSULE (30 MG TOTAL) BY MOUTH DAILY. 30 capsule 3  . loperamide (IMODIUM A-D) 2 MG tablet Take 1 tablet (2 mg total) by mouth 4 (four) times daily as needed for diarrhea or loose stools. 60 tablet 0  . mirtazapine (REMERON) 15 MG tablet Take 1 tablet (15 mg total) by mouth at bedtime. 30 tablet 3  . Multiple Vitamins-Minerals (MULTIVITAMIN ADULT) TABS Take 1 tablet by mouth daily. 30 tablet 11  . mupirocin ointment (BACTROBAN) 2 % Apply to affected areas twice per day. Patient will pick up scripts today. 60 g 1  . nicotine (NICODERM CQ - DOSED IN MG/24 HR) 7 mg/24hr patch Place 1 patch (7 mg total) onto the skin daily for 28 days. 28 patch 0  . ondansetron (ZOFRAN ODT) 4 MG disintegrating tablet Take 1 tablet (4 mg total)  by mouth every 8 (eight) hours as needed for nausea or vomiting. 20 tablet 0  . PARoxetine (PAXIL) 10 MG tablet Take 1 tablet (10 mg total) by mouth daily. 90 tablet 1  . triamcinolone (KENALOG) 0.025 % cream APPLY 1 APPLICATION TOPICALLY 2 (TWO) TIMES DAILY. 30 g 0  . atorvastatin (LIPITOR) 40 MG tablet TAKE 1 TABLET BY MOUTH DAILY. 30 tablet 1  . cyclobenzaprine (FLEXERIL) 5 MG tablet TAKE 1 TABLET (5 MG TOTAL) BY MOUTH 3 (THREE) TIMES DAILY AS NEEDED FOR MUSCLE SPASMS. 60 tablet 1  . losartan (COZAAR) 100 MG tablet TAKE 1 TABLET BY MOUTH DAILY. 30 tablet 1  . omeprazole (PRILOSEC) 40 MG capsule TAKE 1 CAPSULE (40 MG TOTAL) BY MOUTH DAILY. 30 capsule 1  . amLODipine (NORVASC) 10 MG tablet Take 1 tablet (10 mg total) by mouth daily. 90 tablet 1   No facility-administered medications prior to visit.    Allergies  Allergen Reactions  . Bee Venom Anaphylaxis  . Lactose Intolerance (Gi) Other (See Comments)    GI upset       Objective:    BP 125/71 (BP Location: Left Arm, Patient Position: Sitting, Cuff  Size: Normal)   Pulse 98   Temp 97.7 F (36.5 C) (Temporal)   Ht 5\' 11"  (1.803 m)   Wt 176 lb (79.8 kg)   SpO2 97%   BMI 24.55 kg/m  Wt Readings from Last 3 Encounters:  12/19/19 176 lb (79.8 kg)  12/11/19 174 lb (78.9 kg)  09/27/19 180 lb (81.6 kg)    Physical Exam Vitals and nursing note reviewed.  Constitutional:      Appearance: He is well-developed.  HENT:     Head: Normocephalic and atraumatic.  Cardiovascular:     Rate and Rhythm: Normal rate and regular rhythm.     Heart sounds: Normal heart sounds. No murmur. No friction rub. No gallop.   Pulmonary:     Effort: Pulmonary effort is normal. No tachypnea or respiratory distress.     Breath sounds: Normal breath sounds. No decreased breath sounds, wheezing, rhonchi or rales.  Chest:     Chest wall: No tenderness.  Abdominal:     General: Bowel sounds are normal.     Palpations: Abdomen is soft.  Musculoskeletal:        General: Normal range of motion.     Cervical back: Normal range of motion.  Skin:    General: Skin is warm and dry.  Neurological:     Mental Status: He is alert and oriented to person, place, and time.     Coordination: Coordination normal.  Psychiatric:        Behavior: Behavior normal. Behavior is cooperative.        Thought Content: Thought content normal.        Judgment: Judgment normal.          Patient has been counseled extensively about nutrition and exercise as well as the importance of adherence with medications and regular follow-up. The patient was given clear instructions to go to ER or return to medical center if symptoms don't improve, worsen or new problems develop. The patient verbalized understanding.   Follow-up: Return for testosterone lab around May 10-12th same day  testosterone injection has already been scheduled .   [name], FNP-BC [facility] [place], [place]   12/19/2019, 1:00 PM

## 2019-12-20 ENCOUNTER — Other Ambulatory Visit: Payer: Self-pay | Admitting: Nurse Practitioner

## 2019-12-20 LAB — HEMOGLOBIN A1C
Est. average glucose Bld gHb Est-mCnc: 183 mg/dL
Hgb A1c MFr Bld: 8 % — ABNORMAL HIGH (ref 4.8–5.6)

## 2019-12-20 LAB — MICROALBUMIN / CREATININE URINE RATIO
Creatinine, Urine: 27.5 mg/dL
Microalb/Creat Ratio: <11 mg/g creat (ref 0–29)
Microalbumin, Urine: <3 ug/mL

## 2019-12-20 MED ORDER — GLIPIZIDE 5 MG PO TABS: 5.0000 mg | ORAL_TABLET | Freq: Every day | ORAL | 0 refills | Status: DC

## 2019-12-20 MED FILL — glipiZIDE 5 MG TABS: 5 | 30 days supply | Qty: 30 | Fill #0

## 2019-12-22 ENCOUNTER — Other Ambulatory Visit: Payer: Self-pay | Admitting: Family Medicine

## 2019-12-22 DIAGNOSIS — Z09 Encounter for follow-up examination after completed treatment for conditions other than malignant neoplasm: Secondary | ICD-10-CM

## 2019-12-22 MED FILL — DULoxetine HCL 30 MG CPEP: 30 | 30 days supply | Qty: 30 | Fill #0

## 2019-12-25 ENCOUNTER — Ambulatory Visit: Payer: Self-pay

## 2020-01-08 ENCOUNTER — Other Ambulatory Visit: Payer: Self-pay

## 2020-01-08 ENCOUNTER — Ambulatory Visit: Payer: Self-pay | Attending: Nurse Practitioner

## 2020-01-08 DIAGNOSIS — E291 Testicular hypofunction: Secondary | ICD-10-CM

## 2020-01-09 LAB — TESTOSTERONE: Testosterone: 233 ng/dL — ABNORMAL LOW (ref 264–916)

## 2020-01-13 ENCOUNTER — Other Ambulatory Visit: Payer: Self-pay | Admitting: Nurse Practitioner

## 2020-01-13 DIAGNOSIS — E291 Testicular hypofunction: Secondary | ICD-10-CM

## 2020-01-13 MED ORDER — TESTOSTERONE CYPIONATE 200 MG/ML IM SOLN: 200.0000 mg | INTRAMUSCULAR | Status: AC

## 2020-01-18 ENCOUNTER — Other Ambulatory Visit: Payer: Self-pay | Admitting: Nurse Practitioner

## 2020-01-18 DIAGNOSIS — F10982 Alcohol use, unspecified with alcohol-induced sleep disorder: Secondary | ICD-10-CM

## 2020-01-18 MED FILL — PAROXETINE HCL 10 MG TABS: 10 | 30 days supply | Qty: 30 | Fill #5

## 2020-01-18 MED FILL — INVOKANA 300 MG TABLET: 300 | 30 days supply | Qty: 30 | Fill #0

## 2020-01-18 MED FILL — CYCLOBENZAPRINE 5 MG TABLET: 5 | 20 days supply | Qty: 60 | Fill #1

## 2020-01-18 MED FILL — glipiZIDE 5 MG TABS: 5 | 30 days supply | Qty: 30 | Fill #1

## 2020-01-18 MED FILL — OMEPRAZOLE DR 40 MG CAPSULE: 40 | 30 days supply | Qty: 30 | Fill #1

## 2020-01-18 MED FILL — DULoxetine HCL 30 MG CPEP: 30 | 30 days supply | Qty: 30 | Fill #1

## 2020-01-18 MED FILL — AMLODIPINE BESYLATE 10 MG T: 10 | 30 days supply | Qty: 30 | Fill #2

## 2020-01-18 MED FILL — ?ATORVASTATIN 40MG TABLET: 40 | 30 days supply | Qty: 30 | Fill #0

## 2020-01-18 MED FILL — LOSARTAN POTASSIUM 100 MG T: 100 | 30 days supply | Qty: 30 | Fill #0

## 2020-01-22 ENCOUNTER — Other Ambulatory Visit: Payer: Self-pay

## 2020-01-22 ENCOUNTER — Ambulatory Visit: Payer: Self-pay | Attending: Nurse Practitioner

## 2020-01-22 DIAGNOSIS — E291 Testicular hypofunction: Secondary | ICD-10-CM

## 2020-01-22 MED ORDER — TESTOSTERONE CYPIONATE 200 MG/ML IM SOLN
200.0000 mg | Freq: Once | INTRAMUSCULAR | Status: AC
  Administered 2020-01-22: 200 mg via INTRAMUSCULAR

## 2020-01-22 MED FILL — MIRTAZAPINE 15 MG TABLET: 15 | 30 days supply | Qty: 30 | Fill #0

## 2020-01-22 NOTE — Progress Notes (Signed)
Patient arrived at clinic to receive testosterone injection.  Patient supplied his own medication. [phone] [id number] Patient was given shot in right ventroguteal Patient tolerated shot well.

## 2020-02-05 ENCOUNTER — Ambulatory Visit: Payer: Self-pay

## 2020-02-22 ENCOUNTER — Encounter (HOSPITAL_COMMUNITY): Payer: Self-pay | Admitting: Emergency Medicine

## 2020-02-22 ENCOUNTER — Other Ambulatory Visit: Payer: Self-pay

## 2020-02-22 ENCOUNTER — Emergency Department (HOSPITAL_COMMUNITY)
Admission: EM | Admit: 2020-02-22 | Discharge: 2020-02-22 | Disposition: A | Discharge status: Home / Self Care | Payer: Self-pay | Attending: Emergency Medicine | Admitting: Emergency Medicine

## 2020-02-22 DIAGNOSIS — F1020 Alcohol dependence, uncomplicated: Secondary | ICD-10-CM | POA: Insufficient documentation

## 2020-02-22 DIAGNOSIS — Z79899 Other long term (current) drug therapy: Secondary | ICD-10-CM | POA: Insufficient documentation

## 2020-02-22 DIAGNOSIS — E119 Type 2 diabetes mellitus without complications: Secondary | ICD-10-CM | POA: Insufficient documentation

## 2020-02-22 DIAGNOSIS — I10 Essential (primary) hypertension: Secondary | ICD-10-CM | POA: Insufficient documentation

## 2020-02-22 DIAGNOSIS — B182 Chronic viral hepatitis C: Secondary | ICD-10-CM | POA: Insufficient documentation

## 2020-02-22 DIAGNOSIS — F17210 Nicotine dependence, cigarettes, uncomplicated: Secondary | ICD-10-CM | POA: Insufficient documentation

## 2020-02-22 MED ORDER — CHLORDIAZEPOXIDE HCL 25 MG PO CAPS
ORAL_CAPSULE | ORAL | 0 refills | Status: DC
Start: 2020-02-22 — End: 2020-04-08

## 2020-02-22 NOTE — ED Triage Notes (Signed)
Pt requesting detox for ETOH. Last drank was earlier today. Reports drink 9-12 beers daily.

## 2020-02-22 NOTE — ED Provider Notes (Signed)
[facility] DEPT Provider Note   CSN: [id number] Arrival date & time: 02/22/20  1317     History Chief Complaint  Patient presents with  . detox    ETOH    [name] [name] is a 59 y.o. male.  HPI  59 year old male with alcohol abuse.  He reports that he has a history of of the same.  He began drinking again about a month ago very heavily.  He has been able to get himself sober for a period of over a year previously though.  He states that his mother died recently and this has driven him to drinking again.  He is requesting assistance with detox.  No other acute complaints.  Past Medical History:  Diagnosis Date  . Barrett's esophagus dx 2016  . Bronchitis   . Depression   . Diabetes ([place])   . ED (erectile dysfunction)   . GERD (gastroesophageal reflux disease)   . Hepatitis C   . Hiatal hernia 10/2014   3cm  . Neuropathy     Patient Active Problem List   Diagnosis Date Noted  . Alcohol abuse with alcohol-induced mood disorder ([place]) 12/17/2017  . Hereditary hemochromatosis ([place]) 11/23/2017  . 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 Essential hypertension 04/02/2017  . Hypogonadism male 08/13/2016  . ETOH abuse 06/22/2016  . Low serum testosterone level 01/08/2016  . Uncontrolled type 2 diabetes mellitus without complication, without long-term current use of insulin 01/02/2016  . Erectile dysfunction 09/12/2015  . Hepatic cirrhosis ([place]) 01/10/2015  . Chronic hepatitis C without hepatic coma ([place]) 10/31/2014  . Tobacco use disorder 08/03/2013    Past Surgical History:  Procedure Laterality Date  . APPENDECTOMY    . COLONOSCOPY N/A 02/16/2014   Procedure: COLONOSCOPY;  Surgeon: Gatha Mayer, MD;  [facility];  Service: Endoscopy;  Laterality: N/A;  . COLONOSCOPY    . TONSILLECTOMY         Family History  Problem Relation Age of Onset  . Cancer Mother        breast  . Diabetes Mother   . Heart  disease Maternal Grandfather   . Pancreatic cancer Paternal Grandmother   . Stroke Father   . Alcohol abuse Father   . Pancreatic cancer Father   . Colon cancer Neg Hx   . Stomach cancer Neg Hx   . Rectal cancer Neg Hx   . Esophageal cancer Neg Hx     Social History   Tobacco Use  . Smoking status: Current Every Day Smoker    Packs/day: 1.00    Years: 35.00    Pack years: 35.00    Types: Cigarettes  . Smokeless tobacco: Never Used  . Tobacco comment: using the patch  Vaping Use  . Vaping Use: Never used  Substance Use Topics  . Alcohol use: Yes    Alcohol/week: 27.0 standard drinks    Types: 6 Cans of beer, 21 Standard drinks or equivalent per week    Comment: 6 beers a week   . Drug use: No    Home Medications Prior to Admission medications   Medication Sig Start Date End Date Taking? Authorizing Provider  albuterol (VENTOLIN HFA) 108 (90 Base) MCG/ACT inhaler Inhale 2 puffs into the lungs every 6 (six) hours as needed for wheezing or shortness of breath. 06/16/19   [name], NP  amLODipine (NORVASC) 10 MG tablet Take 1 tablet (10 mg total) by mouth daily. 12/19/19 03/18/20  [name], NP  aspirin EC 81 MG tablet Take 1 tablet (81 mg total) by mouth daily. 04/02/17   Funches, Adriana Mccallum, MD  atorvastatin (LIPITOR) 40 MG tablet Take 1 tablet (40 mg total) by mouth daily. 12/19/19   [name], NP  canagliflozin (INVOKANA) 300 MG TABS tablet Take 1 tablet (300 mg total) by mouth daily before breakfast. 09/18/19   [name], NP  chlordiazePOXIDE (LIBRIUM) 25 MG capsule 50mg  PO TID x 1D, then 25-50mg  PO BID X 1D, then 25-50mg  PO QD X 1D 02/22/20   Virgel Manifold, MD  cyclobenzaprine (FLEXERIL) 5 MG tablet TAKE 1 TABLET (5 MG TOTAL) BY MOUTH 3 (THREE) TIMES DAILY AS NEEDED FOR MUSCLE SPASMS. 12/19/19   [name], NP  DULoxetine (CYMBALTA) 30 MG capsule TAKE 1 CAPSULE (30 MG TOTAL) BY MOUTH DAILY. 12/22/19   [name] Rakes, MD  glipiZIDE (GLUCOTROL) 5 MG  tablet Take 1 tablet (5 mg total) by mouth daily before breakfast. 12/20/19 03/19/20  [name], NP  loperamide (IMODIUM A-D) 2 MG tablet Take 1 tablet (2 mg total) by mouth 4 (four) times daily as needed for diarrhea or loose stools. 03/01/19   [name], NP  losartan (COZAAR) 100 MG tablet Take 1 tablet (100 mg total) by mouth daily. 12/19/19   [name], NP  mirtazapine (REMERON) 15 MG tablet TAKE 1 TABLET (15 MG TOTAL) BY MOUTH AT BEDTIME. 01/20/20   [name], NP  Multiple Vitamins-Minerals (MULTIVITAMIN ADULT) TABS Take 1 tablet by mouth daily. 04/02/17   Funches, Adriana Mccallum, MD  mupirocin ointment (BACTROBAN) 2 % Apply to affected areas twice per day. Patient will pick up scripts today. 03/14/19   [name], NP  omeprazole (PRILOSEC) 40 MG capsule Take 1 capsule (40 mg total) by mouth daily. 12/19/19 03/18/20  [name], NP  ondansetron (ZOFRAN ODT) 4 MG disintegrating tablet Take 1 tablet (4 mg total) by mouth every 8 (eight) hours as needed for nausea or vomiting. 02/13/19   Henderly, Britni A, PA-C  triamcinolone (KENALOG) 0.025 % cream APPLY 1 APPLICATION TOPICALLY 2 (TWO) TIMES DAILY. 09/14/19   [name] Rakes, MD    Allergies    Bee venom and Lactose intolerance (gi)  Review of Systems   Review of Systems All systems reviewed and negative, other than as noted in HPI.  Physical Exam Updated Vital Signs BP (!) 149/88   Pulse 94   Temp 98.7 F (37.1 C) (Oral)   Resp 18   SpO2 96%   Physical Exam Vitals and nursing note reviewed.  Constitutional:      General: He is not in acute distress.    Appearance: He is well-developed.  HENT:     Head: Normocephalic and atraumatic.  Eyes:     General:        Right eye: No discharge.        Left eye: No discharge.     Conjunctiva/sclera: Conjunctivae normal.  Pulmonary:     Effort: Pulmonary effort is normal.     Breath sounds: Normal breath sounds.  Musculoskeletal:        General: No tenderness.      Cervical back: Normal range of motion.  Skin:    General: Skin is dry.  Neurological:     Mental Status: He is alert.  Psychiatric:        Behavior: Behavior normal.        Thought Content: Thought content normal.  ED Results / Procedures / Treatments   Labs (all labs ordered are listed, but only abnormal results are displayed) Labs Reviewed - No data to display  EKG None  Radiology No results found.  Procedures Procedures (including critical care time)  Medications Ordered in ED Medications - No data to display  ED Course  I have reviewed the triage vital signs and the nursing notes.  Pertinent labs & imaging results that were available during my care of the patient were reviewed by me and considered in my medical decision making (see chart for details).    MDM Rules/Calculators/A&P                          59 year old male with alcohol abuse requesting detox.  No emergent medical condition or psychiatric condition exists currently.  Explained to patient that he will be provided with a prescription for Librium and outpatient resources.  He expresses some frustration with this plan.  He stated "What if I came back and said I was suicidal?"  I explained to him this is manipulative behavior and not appropriate way for him to get help. Tried to encourage. Resource list provided.    Final Clinical Impression(s) / ED Diagnoses Final diagnoses:  Uncomplicated alcohol dependence ([place])    Rx / DC Orders ED Discharge Orders         Ordered    chlordiazePOXIDE (LIBRIUM) 25 MG capsule     Discontinue  Reprint     02/22/20 1512           Virgel Manifold, MD 02/22/20 1519

## 2020-02-23 NOTE — Progress Notes (Signed)
02/23/2020 317 pm Received call from [facility] pharmacy to clarify dose for Librium. ED provider sent message to clarify. [place], [place] ED TOC CM [phone]

## 2020-02-23 NOTE — Social Work (Addendum)
02/23/2020  @ 3:00pm  Pharmacy called to inquire about dc medications.  Referral sent to [facility], CM, [place] to follow-up.    [place], MSW, [place]

## 2020-03-11 ENCOUNTER — Other Ambulatory Visit: Payer: Self-pay | Admitting: Physician Assistant

## 2020-03-11 ENCOUNTER — Other Ambulatory Visit: Payer: Self-pay | Admitting: Nurse Practitioner

## 2020-03-11 DIAGNOSIS — M791 Myalgia, unspecified site: Secondary | ICD-10-CM

## 2020-03-11 DIAGNOSIS — F410 Panic disorder [episodic paroxysmal anxiety] without agoraphobia: Secondary | ICD-10-CM

## 2020-03-11 MED FILL — ?ATORVASTATIN 40MG TABLET: 40 | 30 days supply | Qty: 30 | Fill #1

## 2020-03-11 MED FILL — OMEPRAZOLE DR 40 MG CAPSULE: 40 | 30 days supply | Qty: 30 | Fill #2

## 2020-03-11 MED FILL — DULoxetine HCL 30 MG CPEP: 30 | 30 days supply | Qty: 30 | Fill #2

## 2020-03-11 MED FILL — INVOKANA 300 MG TABLET: 300 | 30 days supply | Qty: 30 | Fill #1

## 2020-03-11 MED FILL — MIRTAZAPINE 15 MG TABLET: 15 | 30 days supply | Qty: 30 | Fill #1

## 2020-03-11 MED FILL — LOSARTAN POTASSIUM 100 MG T: 100 | 30 days supply | Qty: 30 | Fill #1

## 2020-03-11 MED FILL — ?AMLODIPINE BESYL 10MG TABL: 10 | 30 days supply | Qty: 30 | Fill #3

## 2020-03-11 NOTE — Telephone Encounter (Signed)
Please refill if indicated! 

## 2020-03-11 NOTE — Telephone Encounter (Signed)
Requested medication (s) are due for refill today: yes  Requested medication (s) are on the active medication list: yes  Last refill:  12/19/19 #60 1 refill  Future visit scheduled: no  Notes to clinic:  not delegated per protocol     Requested Prescriptions  Pending Prescriptions Disp Refills   cyclobenzaprine (FLEXERIL) 5 MG tablet [Pharmacy Med Name: CYCLOBENZAPRINE 5 MG TABLET 5 Tablet] 60 tablet 1    Sig: TAKE 1 TABLET (5 MG TOTAL) BY MOUTH 3 (THREE) TIMES DAILY AS NEEDED FOR MUSCLE SPASMS.      Not Delegated - Analgesics:  Muscle Relaxants Failed - 03/11/2020  3:41 PM      Failed - This refill cannot be delegated      Passed - Valid encounter within last 6 months    Recent Outpatient Visits           2 months ago Uncontrolled diabetes mellitus with diabetic neuropathy [facility])   [place], Vernia Buff, NP   5 months ago Tobacco dependence   [place], [place] W, NP   8 months ago Uncontrolled diabetes mellitus with diabetic neuropathy [facility])   [place] [name], NP   12 months ago Hospital discharge follow-up   [place], Zelda W, NP   1 year ago Encounter for examination following treatment at [place], [place], [place]

## 2020-03-11 NOTE — Telephone Encounter (Signed)
Requested medication (s) are due for refill today -yes  Requested medication (s) are on the active medication list -no  Future visit scheduled no  Last refill: 01/18/20  Notes to clinic: Request for medication that is not current on patient list- listed as discontinued 5/22- sent for review of request.  Requested Prescriptions  Pending Prescriptions Disp Refills   PARoxetine (PAXIL) 10 MG tablet [Pharmacy Med Name: PAROXETINE HCL 10 MG TABS 10 Tablet] 30 tablet 1    Sig: TAKE 1 TABLET BY MOUTH DAILY.      Psychiatry:  Antidepressants - SSRI Passed - 03/11/2020  3:41 PM      Passed - Valid encounter within last 6 months    Recent Outpatient Visits           2 months ago Uncontrolled diabetes mellitus with diabetic neuropathy [facility])   [place], Vernia Buff, NP   5 months ago Tobacco dependence   [place], [place] W, NP   8 months ago Uncontrolled diabetes mellitus with diabetic neuropathy [facility])   [place] [name], NP   12 months ago Hospital discharge follow-up   [place], Zelda W, NP   1 year ago Encounter for examination following treatment at [place], [place]                  Requested Prescriptions  Pending Prescriptions Disp Refills   PARoxetine (PAXIL) 10 MG tablet [Pharmacy Med Name: PAROXETINE HCL 10 MG TABS 10 Tablet] 30 tablet 1    Sig: TAKE 1 TABLET BY MOUTH DAILY.      Psychiatry:  Antidepressants - SSRI Passed - 03/11/2020  3:41 PM      Passed - Valid encounter within last 6 months    Recent Outpatient Visits           2 months ago Uncontrolled diabetes mellitus with diabetic neuropathy [facility])   [place], Vernia Buff, NP   5 months ago Tobacco dependence   [place], [place] W, NP   8 months ago Uncontrolled diabetes mellitus with diabetic neuropathy [facility])   [place] [name], NP   12 months ago Hospital discharge follow-up   [place] [name], NP   1 year ago Encounter for examination following treatment at [place], [place], [place]

## 2020-03-15 MED FILL — ?GLIPIZIDE 5MG TABLET: 5 | 30 days supply | Qty: 30 | Fill #2

## 2020-03-22 ENCOUNTER — Ambulatory Visit: Payer: Self-pay | Admitting: Nurse Practitioner

## 2020-04-07 ENCOUNTER — Encounter (HOSPITAL_COMMUNITY): Payer: Self-pay

## 2020-04-07 ENCOUNTER — Emergency Department (HOSPITAL_COMMUNITY): Payer: Self-pay

## 2020-04-07 ENCOUNTER — Other Ambulatory Visit: Payer: Self-pay

## 2020-04-07 ENCOUNTER — Emergency Department (HOSPITAL_COMMUNITY)
Admission: EM | Admit: 2020-04-07 | Discharge: 2020-04-08 | Disposition: A | Discharge status: Home / Self Care | Payer: Self-pay | Attending: Emergency Medicine | Admitting: Emergency Medicine

## 2020-04-07 DIAGNOSIS — S91119A Laceration without foreign body of unspecified toe without damage to nail, initial encounter: Secondary | ICD-10-CM | POA: Insufficient documentation

## 2020-04-07 DIAGNOSIS — E119 Type 2 diabetes mellitus without complications: Secondary | ICD-10-CM | POA: Insufficient documentation

## 2020-04-07 DIAGNOSIS — Y92002 Bathroom of unspecified non-institutional (private) residence single-family (private) house as the place of occurrence of the external cause: Secondary | ICD-10-CM | POA: Insufficient documentation

## 2020-04-07 DIAGNOSIS — F17210 Nicotine dependence, cigarettes, uncomplicated: Secondary | ICD-10-CM | POA: Insufficient documentation

## 2020-04-07 DIAGNOSIS — Y999 Unspecified external cause status: Secondary | ICD-10-CM | POA: Insufficient documentation

## 2020-04-07 DIAGNOSIS — S31010A Laceration without foreign body of lower back and pelvis without penetration into retroperitoneum, initial encounter: Secondary | ICD-10-CM | POA: Insufficient documentation

## 2020-04-07 DIAGNOSIS — W0110XA Fall on same level from slipping, tripping and stumbling with subsequent striking against unspecified object, initial encounter: Secondary | ICD-10-CM | POA: Insufficient documentation

## 2020-04-07 DIAGNOSIS — Y92009 Unspecified place in unspecified non-institutional (private) residence as the place of occurrence of the external cause: Secondary | ICD-10-CM

## 2020-04-07 DIAGNOSIS — Y939 Activity, unspecified: Secondary | ICD-10-CM | POA: Insufficient documentation

## 2020-04-07 DIAGNOSIS — F1010 Alcohol abuse, uncomplicated: Secondary | ICD-10-CM

## 2020-04-07 DIAGNOSIS — I10 Essential (primary) hypertension: Secondary | ICD-10-CM | POA: Insufficient documentation

## 2020-04-07 DIAGNOSIS — T07XXXA Unspecified multiple injuries, initial encounter: Secondary | ICD-10-CM

## 2020-04-07 DIAGNOSIS — Z79899 Other long term (current) drug therapy: Secondary | ICD-10-CM | POA: Insufficient documentation

## 2020-04-07 MED ORDER — LIDOCAINE-EPINEPHRINE (PF) 2 %-1:200000 IJ SOLN
INTRAMUSCULAR | Status: AC
  Filled 2020-04-07: qty 20

## 2020-04-07 MED ORDER — LIDOCAINE-EPINEPHRINE (PF) 2 %-1:200000 IJ SOLN
20.0000 mL | Freq: Once | INTRAMUSCULAR | Status: AC
  Administered 2020-04-07: 20 mL

## 2020-04-07 MED ORDER — BACITRACIN ZINC 500 UNIT/GM EX OINT
TOPICAL_OINTMENT | Freq: Two times a day (BID) | CUTANEOUS | Status: DC
  Administered 2020-04-08: 1 via TOPICAL
  Filled 2020-04-07: qty 1.8

## 2020-04-07 NOTE — Discharge Instructions (Addendum)
We saw you in the ER for your WOUND. Please read the instructions provided on wound care. Keep the area clean and dry, apply bacitracin ointment daily and take the medications provided. RETURN TO THE ER IF THERE IS INCREASED PAIN, REDNESS, PUS COMING OUT from the wound site.   Substance Abuse Treatment Programs  Intensive Outpatient Programs [facility]     [address], [place]       [facility] [address] [place], [place]  [facility]     (Inpatient and outpatient)     [address]           [place] [phone] (Suboxone and Methadone)  [place], [place] [address]      [place] [address] [place], [place]  [facility] (Outpatient/Inpatient, Chemical)    (insurance only) [phone]             [facility] ([place]) [place], [place]     [facility]     [address]     [place], [place]       [facility] (for caregivers and family) [phone] [address] Kristeen Mans. [place], [place]      Residential Treatment Programs [facility]      [address], [place], [place] [address]  [phone]       [facility] [address], [place], [place] [address] [phone]  [facility] [place]        [phone]       [facility] [phone]  [facility] ([place]             [place], [place] or [place] of [place], [address] [phone]  [facility] [place]    [address]      [place], [place]       [facility] [address] [place], [place]  [place]   [address] [place], [place] [address]     [phone]      Admissions: 8am-3pm M-F  [facility] (RTS) [address] [place], [place]  [facility] Program: Residential Program [phone] Days)   [place], [place] or [phone]     ADATC: [facility] [place], [place] (Walk in Hours  over the weekend or by referral)  [facility] [place], [place], [place] [address] [phone]  Crisis Mobile: [facility]:  [phone] (for crisis response 24 hours a day) [facility] Hotline:      [phone] Outpatient Psychiatry and Counseling  [facility]: Mobile Crisis Management 24 hours:  [phone]  [facility] [place] of the [employer] sliding scale fee and walk in schedule: M-F 8am-12pm/1pm-3pm [place], [place] [address] [address] [place], [place] [address] [phone]  [facility] (Formerly known as [facility] [employer])- new patient walk-in appointments available Monday - Friday 8am -3pm.          [address] [place], [place] [address] [phone] or crisis line- [facility]/ Intensive Outpatient Therapy Program [place], [place] [address] [place]      [phone] [address] [place], [place] [address]                 [place]   [facility] [phone]. [place], [place] [address]   [employer] of Care          [address] Johnette Abraham  [place], [place] [address]       [phone]  [place], [place], [place]  [address] [phone]  [facility]    [address] [place], [place] [address]     [place], [place] Joycelyn Man     [place] [address]     [phone]       [facility] [place] [address]  [facility]     [address] [place], [place], [place], [place] [address] [place]     [address]     [place], [place] [address]     [phone]       [facility] [address] [place], [place] [address] [phone] Resources for Temporary Residential Assistance/Crisis [facility]) M-F 8am-3pm   [address], [place] [address]   [phone] Services include: laundry, barbering, support groups, case management, phone  & computer access, showers, AA/NA mtgs, mental health/substance abuse nurse, job skills class, disability information, VA assistance, spiritual classes, etc.   [facility]   [address], [place]     [place] (women and children)       [place]. [place],  [address] [phone] [email or web address] for application and process Application Required  [facility]   [address]    [place]  [address]     [phone]  [place], [place] [address] [phone]([email or web address] application appt.) Application Required  [facility] (women only)    [address]     [place], [place] [address]     [phone]      Intake starts 6pm daily Need valid ID, SSC, & Police report [employer] [address] [place], [place] [phone] Application Required  [employer] (men only)     [address] [place].      [place], [place]       [facility] (Pregnant women only) [address] [place], [place]  [facility]      [place] [address]i [address].      [place], [place] [address]     [phone]             [facility] [address] [place], [place] 90 day commitment/SA/Application process  [facility](men only)     [address] [place] [address]     [place], [place]       Check-in at [facility] [address] [place], [place] [address] [phone] Men/Women/Women and Children must be there by 7 pm  [place], [place]

## 2020-04-07 NOTE — ED Triage Notes (Signed)
Pt BIB EMS from home. Pt has been drinking alcohol. Pt fell in bathroom backwards into glass shower door. Pt has two lacerations to back that continue to bleed. Pt also has some superficial abrasions to head. A&Ox4, but intoxicated.  144/77 HR 96 97% RA CBG 99 Temp 98.4

## 2020-04-07 NOTE — ED Provider Notes (Signed)
11:19 PM Assumed care from Dr. Kathrynn Humble, please see their note for full history, physical and decision making until this point. In brief this is a 59 y.o. year old male who presented to the ED tonight with Fall and Laceration     Here with intoxication and fall. Multiple lacerations already repaired. Pending imaging and sobriety for discharge.   Imaging unremarkable. Patient tolerating PO. Ambulates. Asking for something to help with quitting drinking, will trial librium taper, discussed not taking meds while drinking. States understanding.   Discharge instructions, including strict return precautions for new or worsening symptoms, given. Patient and/or family verbalized understanding and agreement with the plan as described.   Labs, studies and imaging reviewed by myself and considered in medical decision making if ordered. Imaging interpreted by radiology.  Labs Reviewed - No data to display  CT Head Wo Contrast    (Results Pending)  CT Cervical Spine Wo Contrast    (Results Pending)    No follow-ups on file.    Laina Guerrieri, Corene Cornea, MD 04/09/20 [phone]

## 2020-04-08 LAB — CBG MONITORING, ED: Glucose-Capillary: 128 mg/dL — ABNORMAL HIGH (ref 70–99)

## 2020-04-08 MED ORDER — ONDANSETRON 8 MG PO TBDP
8.0000 mg | ORAL_TABLET | Freq: Once | ORAL | Status: AC
  Administered 2020-04-08: 8 mg via ORAL
  Filled 2020-04-08: qty 1

## 2020-04-08 MED ORDER — CHLORDIAZEPOXIDE HCL 25 MG PO CAPS
50.0000 mg | ORAL_CAPSULE | Freq: Once | ORAL | Status: AC
  Administered 2020-04-08: 50 mg via ORAL
  Filled 2020-04-08: qty 2

## 2020-04-08 MED ORDER — ONDANSETRON HCL 4 MG PO TABS
4.0000 mg | ORAL_TABLET | Freq: Three times a day (TID) | ORAL | 0 refills | Status: DC | PRN
Start: 2020-04-08 — End: 2020-10-28

## 2020-04-08 MED ORDER — CHLORDIAZEPOXIDE HCL 25 MG PO CAPS
ORAL_CAPSULE | ORAL | 0 refills | Status: DC
Start: 2020-04-08 — End: 2020-05-21

## 2020-04-08 MED FILL — CHLORDIAZEPOXIDE 25 MG CAP: 25 | 5 days supply | Qty: 20 | Fill #0

## 2020-04-08 NOTE — ED Notes (Signed)
Patient walked to the bathroom without any assistance. Patient had steady gait.

## 2020-04-08 NOTE — ED Notes (Signed)
Patient requested to speak to md for some medication to come off of alcohol. MD notified.

## 2020-04-12 LAB — HM DIABETES EYE EXAM

## 2020-04-13 NOTE — ED Provider Notes (Signed)
[facility] DEPT Provider Note   CSN: [id number] Arrival date & time: 04/07/20  2012     History Chief Complaint  Patient presents with  . Fall  . Laceration    [name] [name] is a 59 y.o. male.  HPI     59 year old male comes in a chief complaint of fall and laceration. Patient has history of diabetes and alcoholism.  He admits to heavy alcohol consumption earlier today leading to a fall in his bathroom.  Patient has injured his back in the process.  EMS was called and the Band-Aid the patient because of heavy bleeding.  Patient is not on any blood thinners.  He reports bleeding from his back and also from his left toe.  Patient is up-to-date with his tetanus shot.  Past Medical History:  Diagnosis Date  . Barrett's esophagus dx 2016  . Bronchitis   . Depression   . Diabetes ([place])   . ED (erectile dysfunction)   . GERD (gastroesophageal reflux disease)   . Hepatitis C   . Hiatal hernia 10/2014   3cm  . Neuropathy     Patient Active Problem List   Diagnosis Date Noted  . Alcohol abuse with alcohol-induced mood disorder ([place]) 12/17/2017  . Hereditary hemochromatosis ([place]) 11/23/2017  . 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 Essential hypertension 04/02/2017  . Hypogonadism male 08/13/2016  . ETOH abuse 06/22/2016  . Low serum testosterone level 01/08/2016  . Uncontrolled type 2 diabetes mellitus without complication, without long-term current use of insulin 01/02/2016  . Erectile dysfunction 09/12/2015  . Hepatic cirrhosis ([place]) 01/10/2015  . Chronic hepatitis C without hepatic coma ([place]) 10/31/2014  . Tobacco use disorder 08/03/2013    Past Surgical History:  Procedure Laterality Date  . APPENDECTOMY    . COLONOSCOPY N/A 02/16/2014   Procedure: COLONOSCOPY;  Surgeon: Gatha Mayer, MD;  Location: [facility] ENDOSCOPY;  Service: Endoscopy;  Laterality: N/A;  . COLONOSCOPY    . TONSILLECTOMY         Family  History  Problem Relation Age of Onset  . Cancer Mother        breast  . Diabetes Mother   . Heart disease Maternal Grandfather   . Pancreatic cancer Paternal Grandmother   . Stroke Father   . Alcohol abuse Father   . Pancreatic cancer Father   . Colon cancer Neg Hx   . Stomach cancer Neg Hx   . Rectal cancer Neg Hx   . Esophageal cancer Neg Hx     Social History   Tobacco Use  . Smoking status: Current Every Day Smoker    Packs/day: 1.00    Years: 35.00    Pack years: 35.00    Types: Cigarettes  . Smokeless tobacco: Never Used  . Tobacco comment: using the patch  Vaping Use  . Vaping Use: Never used  Substance Use Topics  . Alcohol use: Yes    Alcohol/week: 27.0 standard drinks    Types: 6 Cans of beer, 21 Standard drinks or equivalent per week    Comment: 6 beers a week   . Drug use: No    Home Medications Prior to Admission medications   Medication Sig Start Date End Date Taking? Authorizing Provider  albuterol (VENTOLIN HFA) 108 (90 Base) MCG/ACT inhaler Inhale 2 puffs into the lungs every 6 (six) hours as needed for wheezing or shortness of breath. 06/16/19   [name], NP  amLODipine (NORVASC) 10 MG  tablet Take 1 tablet (10 mg total) by mouth daily. 12/19/19 03/18/20  [name], NP  aspirin EC 81 MG tablet Take 1 tablet (81 mg total) by mouth daily. 04/02/17   Funches, Adriana Mccallum, MD  atorvastatin (LIPITOR) 40 MG tablet Take 1 tablet (40 mg total) by mouth daily. 12/19/19   [name], NP  canagliflozin (INVOKANA) 300 MG TABS tablet Take 1 tablet (300 mg total) by mouth daily before breakfast. 09/18/19   [name], NP  chlordiazePOXIDE (LIBRIUM) 25 MG capsule 50mg  PO TID x 2D, then 25-50mg  PO BID X 2D, then 25-50mg  PO QD X 1D 04/08/20   Mesner, Corene Cornea, MD  DULoxetine (CYMBALTA) 30 MG capsule TAKE 1 CAPSULE (30 MG TOTAL) BY MOUTH DAILY. 12/22/19   Charlott Rakes, MD  glipiZIDE (GLUCOTROL) 5 MG tablet Take 1 tablet (5 mg total) by mouth daily before  breakfast. 12/20/19 03/19/20  [name], NP  loperamide (IMODIUM A-D) 2 MG tablet Take 1 tablet (2 mg total) by mouth 4 (four) times daily as needed for diarrhea or loose stools. 03/01/19   [name], NP  losartan (COZAAR) 100 MG tablet Take 1 tablet (100 mg total) by mouth daily. 12/19/19   [name], NP  mirtazapine (REMERON) 15 MG tablet TAKE 1 TABLET (15 MG TOTAL) BY MOUTH AT BEDTIME. 01/20/20   [name], NP  Multiple Vitamins-Minerals (MULTIVITAMIN ADULT) TABS Take 1 tablet by mouth daily. 04/02/17   Funches, Adriana Mccallum, MD  mupirocin ointment (BACTROBAN) 2 % Apply to affected areas twice per day. Patient will pick up scripts today. 03/14/19   [name], NP  omeprazole (PRILOSEC) 40 MG capsule Take 1 capsule (40 mg total) by mouth daily. 12/19/19 03/18/20  [name], NP  ondansetron (ZOFRAN) 4 MG tablet Take 1 tablet (4 mg total) by mouth every 8 (eight) hours as needed for nausea or vomiting. 04/08/20   Mesner, Corene Cornea, MD  triamcinolone (KENALOG) 0.025 % cream APPLY 1 APPLICATION TOPICALLY 2 (TWO) TIMES DAILY. 09/14/19   Charlott Rakes, MD    Allergies    Bee venom and Lactose intolerance (gi)  Review of Systems   Review of Systems  Constitutional: Positive for activity change.  Respiratory: Negative for shortness of breath.   Cardiovascular: Negative for chest pain.  Musculoskeletal: Positive for arthralgias.  Skin: Positive for wound.  Neurological: Negative for headaches.  Hematological: Does not bruise/bleed easily.  All other systems reviewed and are negative.   Physical Exam Updated Vital Signs BP (!) 158/88 (BP Location: Right Arm)   Pulse (!) 102   Temp 98.4 F (36.9 C) (Oral)   Resp 16   Ht 5\' 11"  (1.803 m)   Wt 77.1 kg   SpO2 98%   BMI 23.71 kg/m   Physical Exam Vitals and nursing note reviewed.  Constitutional:      Appearance: He is well-developed.  HENT:     Head: Atraumatic.  Cardiovascular:     Rate and Rhythm: Normal rate.   Pulmonary:     Effort: Pulmonary effort is normal.  Musculoskeletal:     Cervical back: Neck supple.     Comments: Patient has stellate laceration to the back. Laceration is both superficial and deep. Active bleeding noted.  There is also a simple laceration to the toe  Skin:    General: Skin is warm.  Neurological:     Mental Status: He is alert and oriented to person, place, and time.  ED Results / Procedures / Treatments   Labs (all labs ordered are listed, but only abnormal results are displayed) Labs Reviewed  CBG MONITORING, ED - Abnormal; Notable for the following components:      Result Value   Glucose-Capillary 128 (*)    All other components within normal limits    EKG None  Radiology No results found.  Procedures .Marland KitchenLaceration Repair  Date/Time: 04/08/2020 10:13 PM Performed by: Varney Biles, MD Authorized by: Varney Biles, MD   Consent:    Consent obtained:  Verbal   Consent given by:  Patient   Risks discussed:  Infection, pain, retained foreign body, poor cosmetic result, nerve damage and poor wound healing   Alternatives discussed:  Delayed treatment Universal protocol:    Procedure explained and questions answered to patient or proxy's satisfaction: yes     Patient identity confirmed:  Arm band Anesthesia (see MAR for exact dosages):    Anesthesia method:  Local infiltration   Local anesthetic:  Lidocaine 2% WITH epi Laceration details:    Location:  Trunk   Trunk location:  Lower back   Length (cm):  30 (Laceration over the toe and also the back)   Depth (mm):  5 Repair type:    Repair type:  Complex Exploration:    Limited defect created (wound extended): no     Wound exploration: wound explored through full range of motion and entire depth of wound probed and visualized     Wound extent: no foreign bodies/material noted and no muscle damage noted     Contaminated: no   Treatment:    Area cleansed with:  Betadine and saline    Amount of cleaning:  Extensive   Irrigation solution:  Sterile saline   Irrigation volume:  100   Irrigation method:  Pressure wash   Visualized foreign bodies/material removed: yes     Debridement:  Minimal   Undermining:  Minimal   Scar revision: no   Skin repair:    Repair method:  Sutures and Steri-Strips   Suture size:  5-0   Suture material:  Nylon   Suture technique:  Simple interrupted   Number of sutures:  17   Number of Steri-Strips:  10 Approximation:    Approximation:  Close Post-procedure details:    Dressing:  Antibiotic ointment and non-adherent dressing   Patient tolerance of procedure:  Tolerated well, no immediate complications .Nerve Block  Date/Time: 04/13/2020 3:16 PM Performed by: Varney Biles, MD Authorized by: Varney Biles, MD   Consent:    Consent obtained:  Verbal   Consent given by:  Patient   Risks discussed:  Infection, bleeding, pain and nerve damage   Alternatives discussed:  No treatment Universal protocol:    Procedure explained and questions answered to patient or proxy's satisfaction: yes     Patient identity confirmed:  Arm band Indications:    Indications:  Pain relief and procedural anesthesia   (including critical care time)  Medications Ordered in ED Medications  lidocaine-EPINEPHrine (XYLOCAINE W/EPI) 2 %-1:200000 (PF) injection 20 mL (20 mLs Infiltration Given by Other 04/07/20 2213)  ondansetron (ZOFRAN-ODT) disintegrating tablet 8 mg (8 mg Oral Given 04/08/20 0650)  chlordiazePOXIDE (LIBRIUM) capsule 50 mg (50 mg Oral Given 04/08/20 3570)    ED Course  I have reviewed the triage vital signs and the nursing notes.  Pertinent labs & imaging results that were available during my care of the patient were reviewed by me and considered in my medical decision making (  see chart for details).    MDM Rules/Calculators/A&P                          DDx includes: - Mechanical falls - ICH - Fractures - Contusions - Soft tissue  injury  Pt comes in with cc of fall. Has complex lacerations to the back. Repair completed.   Toe laceration was also repaired (Right side). Tetanus is updated.    Final Clinical Impression(s) / ED Diagnoses Final diagnoses:  Fall in home, initial encounter  Multiple lacerations  Alcohol abuse    Rx / DC Orders ED Discharge Orders         Ordered    ondansetron (ZOFRAN) 4 MG tablet  Every 8 hours PRN     Discontinue  Reprint     04/08/20 0645    chlordiazePOXIDE (LIBRIUM) 25 MG capsule     Discontinue  Reprint     04/08/20 0645           Varney Biles, MD 04/13/20 1658

## 2020-04-15 ENCOUNTER — Ambulatory Visit: Payer: Self-pay

## 2020-04-16 ENCOUNTER — Ambulatory Visit: Payer: Self-pay | Admitting: Physician Assistant

## 2020-04-16 ENCOUNTER — Other Ambulatory Visit: Payer: Self-pay

## 2020-04-16 VITALS — BP 151/83 | HR 102

## 2020-04-16 DIAGNOSIS — T8141XA Infection following a procedure, superficial incisional surgical site, initial encounter: Secondary | ICD-10-CM

## 2020-04-16 DIAGNOSIS — Z4802 Encounter for removal of sutures: Secondary | ICD-10-CM

## 2020-04-16 NOTE — Progress Notes (Signed)
Established Patient Office Visit  Subjective:  Patient ID: [name] [name], male    DOB: 02-02-1961  Age: 59 y.o. MRN: [id number]  CC:  Chief Complaint  Patient presents with  . Suture / Staple Removal    HPI [name] [name] reports that he fell through his glass shower door and was seen at the emergency department and received sutures and is requesting that they be removed.    There are two wounds on his mid lower back that received sutures and he also had sutures placed right second toe   Past Medical History:  Diagnosis Date  . Barrett's esophagus dx 2016  . Bronchitis   . Depression   . Diabetes ([place])   . ED (erectile dysfunction)   . GERD (gastroesophageal reflux disease)   . Hepatitis C   . Hiatal hernia 10/2014   3cm  . Neuropathy     Past Surgical History:  Procedure Laterality Date  . APPENDECTOMY    . COLONOSCOPY N/A 02/16/2014   Procedure: COLONOSCOPY;  Surgeon: Gatha Mayer, MD;  Location: [facility];  Service: Endoscopy;  Laterality: N/A;  . COLONOSCOPY    . TONSILLECTOMY      Family History  Problem Relation Age of Onset  . Cancer Mother        breast  . Diabetes Mother   . Heart disease Maternal Grandfather   . Pancreatic cancer Paternal Grandmother   . Stroke Father   . Alcohol abuse Father   . Pancreatic cancer Father   . Colon cancer Neg Hx   . Stomach cancer Neg Hx   . Rectal cancer Neg Hx   . Esophageal cancer Neg Hx     Social History   Socioeconomic History  . Marital status: Single    Spouse name: Not on file  . Number of children: Not on file  . Years of education: Not on file  . Highest education level: Not on file  Occupational History  . Not on file  Tobacco Use  . Smoking status: Current Every Day Smoker    Packs/day: 1.00    Years: 35.00    Pack years: 35.00    Types: Cigarettes  . Smokeless tobacco: Never Used  . Tobacco comment: using the patch  Vaping Use  . Vaping Use: Never used  Substance and Sexual  Activity  . Alcohol use: Yes    Alcohol/week: 27.0 standard drinks    Types: 6 Cans of beer, 21 Standard drinks or equivalent per week    Comment: 6 beers a week   . Drug use: No  . Sexual activity: Not Currently  Other Topics Concern  . Not on file  Social History Narrative  . Not on file   Social Determinants of Health   Financial Resource Strain:   . Difficulty of Paying Living Expenses:   Food Insecurity:   . Worried About Charity fundraiser in the Last Year:   . Arboriculturist in the Last Year:   Transportation Needs:   . Film/video editor (Medical):   Marland Kitchen Lack of Transportation (Non-Medical):   Physical Activity:   . Days of Exercise per Week:   . Minutes of Exercise per Session:   Stress:   . Feeling of Stress :   Social Connections:   . Frequency of Communication with Friends and Family:   . Frequency of Social Gatherings with Friends and Family:   . Attends Religious Services:   . Active  Member of Clubs or Organizations:   . Attends Archivist Meetings:   Marland Kitchen Marital Status:   Intimate Partner Violence:   . Fear of Current or Ex-Partner:   . Emotionally Abused:   Marland Kitchen Physically Abused:   . Sexually Abused:     Outpatient Medications Prior to Visit  Medication Sig Dispense Refill  . albuterol (VENTOLIN HFA) 108 (90 Base) MCG/ACT inhaler Inhale 2 puffs into the lungs every 6 (six) hours as needed for wheezing or shortness of breath. 18 g 1  . amLODipine (NORVASC) 10 MG tablet Take 1 tablet (10 mg total) by mouth daily. 90 tablet 1  . aspirin EC 81 MG tablet Take 1 tablet (81 mg total) by mouth daily. 30 tablet 11  . atorvastatin (LIPITOR) 40 MG tablet Take 1 tablet (40 mg total) by mouth daily. 90 tablet 2  . canagliflozin (INVOKANA) 300 MG TABS tablet Take 1 tablet (300 mg total) by mouth daily before breakfast. 90 tablet 1  . chlordiazePOXIDE (LIBRIUM) 25 MG capsule 50mg  PO TID x 2D, then 25-50mg  PO BID X 2D, then 25-50mg  PO QD X 1D 20 capsule 0  .  DULoxetine (CYMBALTA) 30 MG capsule TAKE 1 CAPSULE (30 MG TOTAL) BY MOUTH DAILY. 30 capsule 3  . loperamide (IMODIUM A-D) 2 MG tablet Take 1 tablet (2 mg total) by mouth 4 (four) times daily as needed for diarrhea or loose stools. 60 tablet 0  . losartan (COZAAR) 100 MG tablet Take 1 tablet (100 mg total) by mouth daily. 90 tablet 1  . mirtazapine (REMERON) 15 MG tablet TAKE 1 TABLET (15 MG TOTAL) BY MOUTH AT BEDTIME. 30 tablet 3  . Multiple Vitamins-Minerals (MULTIVITAMIN ADULT) TABS Take 1 tablet by mouth daily. 30 tablet 11  . mupirocin ointment (BACTROBAN) 2 % Apply to affected areas twice per day. Patient will pick up scripts today. 60 g 1  . omeprazole (PRILOSEC) 40 MG capsule Take 1 capsule (40 mg total) by mouth daily. 90 capsule 1  . ondansetron (ZOFRAN) 4 MG tablet Take 1 tablet (4 mg total) by mouth every 8 (eight) hours as needed for nausea or vomiting. 30 tablet 0  . triamcinolone (KENALOG) 0.025 % cream APPLY 1 APPLICATION TOPICALLY 2 (TWO) TIMES DAILY. 30 g 0  . glipiZIDE (GLUCOTROL) 5 MG tablet Take 1 tablet (5 mg total) by mouth daily before breakfast. 90 tablet 0   No facility-administered medications prior to visit.    Allergies  Allergen Reactions  . Bee Venom Anaphylaxis  . Lactose Intolerance (Gi) Other (See Comments)    GI upset    ROS Review of Systems  Constitutional: Negative for chills, fatigue and fever.  HENT: Negative.   Eyes: Negative.   Respiratory: Negative.   Cardiovascular: Negative.   Gastrointestinal: Negative.   Endocrine: Negative.   Genitourinary: Negative.   Musculoskeletal: Negative.   Skin: Positive for wound.  Allergic/Immunologic: Negative.   Neurological: Negative.   Hematological: Negative.   Psychiatric/Behavioral: Negative.       Objective:    Physical Exam Constitutional:      General: He is not in acute distress.    Appearance: Normal appearance. He is not ill-appearing.  HENT:     Right Ear: External ear normal.      Left Ear: External ear normal.     Nose: Nose normal.     Mouth/Throat:     Mouth: Mucous membranes are moist.     Pharynx: Oropharynx is clear.  Eyes:  Extraocular Movements: Extraocular movements intact.     Conjunctiva/sclera: Conjunctivae normal.     Pupils: Pupils are equal, round, and reactive to light.  Cardiovascular:     Rate and Rhythm: Normal rate and regular rhythm.     Pulses: Normal pulses.     Heart sounds: Normal heart sounds.  Pulmonary:     Effort: Pulmonary effort is normal.     Breath sounds: Normal breath sounds.  Abdominal:     General: Abdomen is flat. Bowel sounds are normal.     Palpations: Abdomen is soft.  Musculoskeletal:        General: Normal range of motion.     Cervical back: Normal range of motion and neck supple.       Feet:  Skin:    General: Skin is warm.       Neurological:     General: No focal deficit present.     Mental Status: He is alert and oriented to person, place, and time.  Psychiatric:        Mood and Affect: Mood normal.        Behavior: Behavior normal.        Thought Content: Thought content normal.        Judgment: Judgment normal.     BP (!) 151/83   Pulse (!) 102  Wt Readings from Last 3 Encounters:  04/07/20 170 lb (77.1 kg)  12/19/19 176 lb (79.8 kg)  12/11/19 174 lb (78.9 kg)     Health Maintenance Due  Topic Date Due  . OPHTHALMOLOGY EXAM  01/02/2015  . FOOT EXAM  11/24/2018  . INFLUENZA VACCINE  03/31/2020    There are no preventive care reminders to display for this patient.  Lab Results  Component Value Date   TSH 0.940 09/28/2013   Lab Results  Component Value Date   WBC 7.7 09/20/2019   HGB 15.3 09/20/2019   HCT 45.2 09/20/2019   MCV 95 09/20/2019   PLT 150 09/20/2019   Lab Results  Component Value Date   NA 135 10/16/2019   K 4.3 10/16/2019   CO2 21 10/16/2019   GLUCOSE 213 (H) 10/16/2019   BUN 9 10/16/2019   CREATININE 0.61 (L) 10/16/2019   BILITOT 0.3 09/20/2019   ALKPHOS  105 09/20/2019   AST 109 (H) 09/20/2019   ALT 112 (H) 09/20/2019   PROT 8.2 09/20/2019   ALBUMIN 4.6 09/20/2019   CALCIUM 9.5 10/16/2019   ANIONGAP 13 03/14/2019   Lab Results  Component Value Date   CHOL 135 06/16/2019   Lab Results  Component Value Date   HDL 49 06/16/2019   Lab Results  Component Value Date   LDLCALC 72 06/16/2019   Lab Results  Component Value Date   TRIG 71 06/16/2019   Lab Results  Component Value Date   CHOLHDL 2.8 06/16/2019   Lab Results  Component Value Date   HGBA1C 8.0 (H) 12/19/2019      Assessment & Plan:   Problem List Items Addressed This Visit    None    Visit Diagnoses    Visit for suture removal    -  Primary   Infection involving suture with abscess        1. Visit for suture removal ED note stated that 17 sutures were placed, due to extensive scabbing, it was difficult to determine if all sutures were removed from the 2 wounds on his back.  Did not remove the sutures from second right  toe due to purulent discharge.  Patient's last hemoglobin A1c was 8.0, trial Keflex 500 mg 4 times a day for 1 week, patient to return to mobile unit on Monday afternoon 04/22/20 for further evaluation.  Patient encouraged to keep area clean and dry.  2. Infection involving suture with abscess    Meds ordered this encounter  Medications  . cephALEXin (KEFLEX) 500 MG capsule    Sig: Take 1 capsule (500 mg total) by mouth 4 (four) times daily.    Dispense:  28 capsule    Refill:  0    Order Specific Question:   Supervising Provider    Answer:   Asencion Noble E [[id number]]    I have reviewed the patient's medical history (PMH, PSH, Social History, Family History, Medications, and allergies) , and have been updated if relevant. I spent 30 minutes reviewing chart and  face to face time with patient.    Follow-up: Return in about 6 days (around 04/22/2020).    Loraine Grip Mayers, PA-C

## 2020-04-17 ENCOUNTER — Other Ambulatory Visit: Payer: Self-pay | Admitting: Nurse Practitioner

## 2020-04-17 MED ORDER — CEPHALEXIN 500 MG PO CAPS: 500.0000 mg | ORAL_CAPSULE | Freq: Four times a day (QID) | ORAL | 0 refills | Status: DC

## 2020-04-17 MED FILL — INVOKANA 300 MG TABLET: 300 | 30 days supply | Qty: 30 | Fill #2

## 2020-04-17 MED FILL — MIRTAZAPINE 15 MG TABLET: 15 | 30 days supply | Qty: 30 | Fill #2

## 2020-04-17 MED FILL — ?ATORVASTATIN 40MG TABLET: 40 | 30 days supply | Qty: 30 | Fill #2

## 2020-04-17 MED FILL — OMEPRAZOLE DR 40 MG CAPSULE: 40 | 30 days supply | Qty: 30 | Fill #3

## 2020-04-17 MED FILL — CEPHALEXIN 500 MG CAPSULE: 500 | 7 days supply | Qty: 28 | Fill #0

## 2020-04-17 MED FILL — LOSARTAN POTASSIUM 100 MG T: 100 | 30 days supply | Qty: 30 | Fill #2

## 2020-04-17 MED FILL — ?DULoxetine HCL 30MG CPEP: 30 | 30 days supply | Qty: 30 | Fill #3

## 2020-04-17 MED FILL — ?GLIPIZIDE 5MG TABLET: 5 | 30 days supply | Qty: 30 | Fill #0

## 2020-04-17 MED FILL — AMLODIPINE BESYLATE 10 MG T: 10 | 30 days supply | Qty: 30 | Fill #4

## 2020-04-22 ENCOUNTER — Ambulatory Visit: Payer: Self-pay

## 2020-04-26 ENCOUNTER — Ambulatory Visit (HOSPITAL_BASED_OUTPATIENT_CLINIC_OR_DEPARTMENT_OTHER): Payer: Self-pay | Admitting: Family

## 2020-04-26 DIAGNOSIS — Z5329 Procedure and treatment not carried out because of patient's decision for other reasons: Secondary | ICD-10-CM

## 2020-04-26 NOTE — Progress Notes (Signed)
Patient did not show for appointment.   

## 2020-05-01 ENCOUNTER — Ambulatory Visit: Payer: Self-pay | Admitting: Physician Assistant

## 2020-05-01 VITALS — BP 106/64 | HR 112 | Temp 98.7°F | Resp 18 | Ht 71.0 in

## 2020-05-01 DIAGNOSIS — T8141XA Infection following a procedure, superficial incisional surgical site, initial encounter: Secondary | ICD-10-CM

## 2020-05-01 DIAGNOSIS — Z4802 Encounter for removal of sutures: Secondary | ICD-10-CM

## 2020-05-01 DIAGNOSIS — F10982 Alcohol use, unspecified with alcohol-induced sleep disorder: Secondary | ICD-10-CM

## 2020-05-01 MED ORDER — MIRTAZAPINE 15 MG PO TABS: 15.0000 mg | ORAL_TABLET | Freq: Every day | ORAL | 1 refills | Status: DC

## 2020-05-01 NOTE — Progress Notes (Signed)
Established Patient Office Visit  Subjective:  Patient ID: [name] [name], male    DOB: 05/25/1961  Age: 59 y.o. MRN: [id number]  CC:  Chief Complaint  Patient presents with  . Follow-up    HPI [name] [name] reports that he did finish the course of antibiotics, still complaining toe pain, states that he continues to hit it several times a day on his bed frame.   Reports that he has been over drinking, has not been eating well, states that he feels depressed.  Request refill of his Remeron  Past Medical History:  Diagnosis Date  . Barrett's esophagus dx 2016  . Bronchitis   . Depression   . Diabetes ([place])   . ED (erectile dysfunction)   . GERD (gastroesophageal reflux disease)   . Hepatitis C   . Hiatal hernia 10/2014   3cm  . Neuropathy     Past Surgical History:  Procedure Laterality Date  . APPENDECTOMY    . COLONOSCOPY N/A 02/16/2014   Procedure: COLONOSCOPY;  Surgeon: Gatha Mayer, MD;  Location: [facility];  Service: Endoscopy;  Laterality: N/A;  . COLONOSCOPY    . TONSILLECTOMY      Family History  Problem Relation Age of Onset  . Cancer Mother        breast  . Diabetes Mother   . Heart disease Maternal Grandfather   . Pancreatic cancer Paternal Grandmother   . Stroke Father   . Alcohol abuse Father   . Pancreatic cancer Father   . Colon cancer Neg Hx   . Stomach cancer Neg Hx   . Rectal cancer Neg Hx   . Esophageal cancer Neg Hx     Social History   Socioeconomic History  . Marital status: Single    Spouse name: Not on file  . Number of children: Not on file  . Years of education: Not on file  . Highest education level: Not on file  Occupational History  . Not on file  Tobacco Use  . Smoking status: Current Every Day Smoker    Packs/day: 1.00    Years: 35.00    Pack years: 35.00    Types: Cigarettes  . Smokeless tobacco: Never Used  . Tobacco comment: using the patch  Vaping Use  . Vaping Use: Never used  Substance and Sexual  Activity  . Alcohol use: Yes    Alcohol/week: 27.0 standard drinks    Types: 6 Cans of beer, 21 Standard drinks or equivalent per week    Comment: 6 beers a week   . Drug use: No  . Sexual activity: Not Currently  Other Topics Concern  . Not on file  Social History Narrative  . Not on file   Social Determinants of Health   Financial Resource Strain:   . Difficulty of Paying Living Expenses: Not on file  Food Insecurity:   . Worried About Charity fundraiser in the Last Year: Not on file  . Ran Out of Food in the Last Year: Not on file  Transportation Needs:   . Lack of Transportation (Medical): Not on file  . Lack of Transportation (Non-Medical): Not on file  Physical Activity:   . Days of Exercise per Week: Not on file  . Minutes of Exercise per Session: Not on file  Stress:   . Feeling of Stress : Not on file  Social Connections:   . Frequency of Communication with Friends and Family: Not on file  . Frequency  of Social Gatherings with Friends and Family: Not on file  . Attends Religious Services: Not on file  . Active Member of Clubs or Organizations: Not on file  . Attends Archivist Meetings: Not on file  . Marital Status: Not on file  Intimate Partner Violence:   . Fear of Current or Ex-Partner: Not on file  . Emotionally Abused: Not on file  . Physically Abused: Not on file  . Sexually Abused: Not on file    Outpatient Medications Prior to Visit  Medication Sig Dispense Refill  . albuterol (VENTOLIN HFA) 108 (90 Base) MCG/ACT inhaler Inhale 2 puffs into the lungs every 6 (six) hours as needed for wheezing or shortness of breath. 18 g 1  . amLODipine (NORVASC) 10 MG tablet Take 1 tablet (10 mg total) by mouth daily. 90 tablet 1  . aspirin EC 81 MG tablet Take 1 tablet (81 mg total) by mouth daily. 30 tablet 11  . atorvastatin (LIPITOR) 40 MG tablet Take 1 tablet (40 mg total) by mouth daily. 90 tablet 2  . canagliflozin (INVOKANA) 300 MG TABS tablet Take  1 tablet (300 mg total) by mouth daily before breakfast. 90 tablet 1  . cephALEXin (KEFLEX) 500 MG capsule Take 1 capsule (500 mg total) by mouth 4 (four) times daily. 28 capsule 0  . chlordiazePOXIDE (LIBRIUM) 25 MG capsule 50mg  PO TID x 2D, then 25-50mg  PO BID X 2D, then 25-50mg  PO QD X 1D 20 capsule 0  . DULoxetine (CYMBALTA) 30 MG capsule TAKE 1 CAPSULE (30 MG TOTAL) BY MOUTH DAILY. 30 capsule 3  . glipiZIDE (GLUCOTROL) 5 MG tablet TAKE 1 TABLET (5 MG TOTAL) BY MOUTH DAILY BEFORE BREAKFAST. 30 tablet 0  . loperamide (IMODIUM A-D) 2 MG tablet Take 1 tablet (2 mg total) by mouth 4 (four) times daily as needed for diarrhea or loose stools. 60 tablet 0  . losartan (COZAAR) 100 MG tablet Take 1 tablet (100 mg total) by mouth daily. 90 tablet 1  . Multiple Vitamins-Minerals (MULTIVITAMIN ADULT) TABS Take 1 tablet by mouth daily. 30 tablet 11  . mupirocin ointment (BACTROBAN) 2 % Apply to affected areas twice per day. Patient will pick up scripts today. 60 g 1  . omeprazole (PRILOSEC) 40 MG capsule Take 1 capsule (40 mg total) by mouth daily. 90 capsule 1  . ondansetron (ZOFRAN) 4 MG tablet Take 1 tablet (4 mg total) by mouth every 8 (eight) hours as needed for nausea or vomiting. 30 tablet 0  . triamcinolone (KENALOG) 0.025 % cream APPLY 1 APPLICATION TOPICALLY 2 (TWO) TIMES DAILY. 30 g 0  . mirtazapine (REMERON) 15 MG tablet TAKE 1 TABLET (15 MG TOTAL) BY MOUTH AT BEDTIME. 30 tablet 3   No facility-administered medications prior to visit.    Allergies  Allergen Reactions  . Bee Venom Anaphylaxis  . Lactose Intolerance (Gi) Other (See Comments)    GI upset    ROS Review of Systems  Constitutional: Positive for unexpected weight change.  HENT: Negative.   Eyes: Negative.   Respiratory: Negative.   Cardiovascular: Negative.   Gastrointestinal: Negative.   Endocrine: Negative.   Genitourinary: Negative.   Musculoskeletal: Negative.   Skin: Positive for wound.  Allergic/Immunologic:  Negative.   Neurological: Negative.   Hematological: Negative.   Psychiatric/Behavioral: Positive for dysphoric mood and sleep disturbance. Negative for self-injury and suicidal ideas.      Objective:    Physical Exam Vitals and nursing note reviewed.  Constitutional:  Appearance: Normal appearance.  HENT:     Head: Normocephalic and atraumatic.     Right Ear: External ear normal.     Left Ear: External ear normal.     Nose: Nose normal.     Mouth/Throat:     Mouth: Mucous membranes are moist.     Pharynx: Oropharynx is clear.  Eyes:     Extraocular Movements: Extraocular movements intact.     Conjunctiva/sclera: Conjunctivae normal.     Pupils: Pupils are equal, round, and reactive to light.  Cardiovascular:     Rate and Rhythm: Normal rate and regular rhythm.     Pulses: Normal pulses.     Heart sounds: Normal heart sounds.  Pulmonary:     Effort: Pulmonary effort is normal.     Breath sounds: Normal breath sounds.  Abdominal:     General: Abdomen is flat.     Palpations: Abdomen is soft.  Musculoskeletal:        General: Normal range of motion.     Cervical back: Normal range of motion and neck supple.       Feet:  Skin:      Neurological:     General: No focal deficit present.     Mental Status: He is alert and oriented to person, place, and time.  Psychiatric:        Mood and Affect: Mood normal.        Behavior: Behavior normal.        Thought Content: Thought content normal.        Judgment: Judgment normal.     BP 106/64 (BP Location: Left Arm, Patient Position: Sitting, Cuff Size: Normal)   Pulse (!) 112   Temp 98.7 F (37.1 C) (Oral)   Resp 18   Ht 5\' 11"  (1.803 m)   SpO2 93%   BMI 23.71 kg/m  Wt Readings from Last 3 Encounters:  04/07/20 170 lb (77.1 kg)  12/19/19 176 lb (79.8 kg)  12/11/19 174 lb (78.9 kg)     Health Maintenance Due  Topic Date Due  . OPHTHALMOLOGY EXAM  01/02/2015  . FOOT EXAM  11/24/2018  . INFLUENZA VACCINE   03/31/2020    There are no preventive care reminders to display for this patient.  Lab Results  Component Value Date   TSH 0.940 09/28/2013   Lab Results  Component Value Date   WBC 7.7 09/20/2019   HGB 15.3 09/20/2019   HCT 45.2 09/20/2019   MCV 95 09/20/2019   PLT 150 09/20/2019   Lab Results  Component Value Date   NA 135 10/16/2019   K 4.3 10/16/2019   CO2 21 10/16/2019   GLUCOSE 213 (H) 10/16/2019   BUN 9 10/16/2019   CREATININE 0.61 (L) 10/16/2019   BILITOT 0.3 09/20/2019   ALKPHOS 105 09/20/2019   AST 109 (H) 09/20/2019   ALT 112 (H) 09/20/2019   PROT 8.2 09/20/2019   ALBUMIN 4.6 09/20/2019   CALCIUM 9.5 10/16/2019   ANIONGAP 13 03/14/2019   Lab Results  Component Value Date   CHOL 135 06/16/2019   Lab Results  Component Value Date   HDL 49 06/16/2019   Lab Results  Component Value Date   LDLCALC 72 06/16/2019   Lab Results  Component Value Date   TRIG 71 06/16/2019   Lab Results  Component Value Date   CHOLHDL 2.8 06/16/2019   Lab Results  Component Value Date   HGBA1C 8.0 (H) 12/19/2019  Assessment & Plan:   Problem List Items Addressed This Visit    None    Visit Diagnoses    Visit for suture removal    -  Primary   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  Encourage patient to follow-up with behavioral health for medication management.  Patient education given on alcohol disorder - mirtazapine (REMERON) 15 MG tablet; Take 1 tablet (15 mg total) by mouth at bedtime.  Dispense: 30 tablet; Refill: 1  2. Visit for suture removal Encourage patient to keep wound clean and dry, patient given packets of Neosporin  3. Infection involving suture with abscess Infection appears resolved   Meds ordered this encounter  Medications  . mirtazapine (REMERON) 15 MG  tablet    Sig: Take 1 tablet (15 mg total) by mouth at bedtime.    Dispense:  30 tablet    Refill:  1    Order Specific Question:   Supervising Provider    Answer:   Asencion Noble E [[id number]]    I have reviewed the patient's medical history (PMH, PSH, Social History, Family History, Medications, and allergies) , and have been updated if relevant. I spent 20 minutes reviewing chart and  face to face time with patient.    Follow-up: Return if symptoms worsen or fail to improve.    Loraine Grip Mayers, PA-C

## 2020-05-01 NOTE — Patient Instructions (Signed)
Continue to keep wound covered and clean, you can use vitamin E oil on the scars on your back.  I encourage you to call community health and wellness to make a follow-up appointment with your provider, and call your behavioral health provider for follow-up  Kennieth Rad, PA-C Physician Assistant [place] [email or web address]   Alcohol Use Disorder Alcohol use disorder is when your drinking disrupts your daily life. When you have this condition, you drink too much alcohol and you cannot control your drinking. Alcohol use disorder can cause serious problems with your physical health. It can affect your brain, heart, liver, pancreas, immune system, stomach, and intestines. Alcohol use disorder can increase your risk for certain cancers and cause problems with your mental health, such as depression, anxiety, psychosis, delirium, and dementia. People with this disorder risk hurting themselves and others. What are the causes? This condition is caused by drinking too much alcohol over time. It is not caused by drinking too much alcohol only one or two times. Some people with this condition drink alcohol to cope with or escape from negative life events. Others drink to relieve pain or symptoms of mental illness. What increases the risk? You are more likely to develop this condition if:  You have a family history of alcohol use disorder.  Your culture encourages drinking to the point of intoxication, or makes alcohol easy to get.  You had a mood or conduct disorder in childhood.  You have been a victim of abuse.  You are an adolescent and: ? You have poor grades or difficulties in school. ? Your caregivers do not talk to you about saying no to alcohol, or supervise your activities. ? You are impulsive or you have trouble with self-control. What are the signs or symptoms? Symptoms of this condition include:  Drinkingmore than you want  to.  Drinking for longer than you want to.  Trying several times to drink less or to control your drinking.  Spending a lot of time getting alcohol, drinking, or recovering from drinking.  Craving alcohol.  Having problems at work, at school, or at home due to drinking.  Having problems in relationships due to drinking.  Drinking when it is dangerous to drink, such as before driving a car.  Continuing to drink even though you know you might have a physical or mental problem related to drinking.  Needing more and more alcohol to get the same effect you want from the alcohol (building up tolerance).  Having symptoms of withdrawal when you stop drinking. Symptoms of withdrawal include: ? Fatigue. ? Nightmares. ? Trouble sleeping. ? Depression. ? Anxiety. ? Fever. ? Seizures. ? Severe confusion. ? Feeling or seeing things that are not there (hallucinations). ? Tremors. ? Rapid heart rate. ? Rapid breathing. ? High blood pressure.  Drinking to avoid symptoms of withdrawal. How is this diagnosed? This condition is diagnosed with an assessment. Your health care provider may start the assessment by asking three or four questions about your drinking. Your health care provider may perform a physical exam or do lab tests to see if you have physical problems resulting from alcohol use. She or he may refer you to a mental health professional for evaluation. How is this treated? Some people with alcohol use disorder are able to reduce their alcohol use to low-risk levels. Others need to completely quit drinking alcohol. When necessary, mental health professionals with specialized training in substance use treatment can help. Your health care provider can  help you decide how severe your alcohol use disorder is and what type of treatment you need. The following forms of treatment are available:  Detoxification. Detoxification involves quitting drinking and using prescription medicines  within the first week to help lessen withdrawal symptoms. This treatment is important for people who have had withdrawal symptoms before and for heavy drinkers who are likely to have withdrawal symptoms. Alcohol withdrawal can be dangerous, and in severe cases, it can cause death. Detoxification may be provided in a home, community, or primary care setting, or in a hospital or substance use treatment facility.  Counseling. This treatment is also called talk therapy. It is provided by substance use treatment counselors. A counselor can address the reasons you use alcohol and suggest ways to keep you from drinking again or to prevent problem drinking. The goals of talk therapy are to: ? Find healthy activities and ways for you to cope with stress. ? Identify and avoid the things that trigger your alcohol use. ? Help you learn how to handle cravings.  Medicines.Medicines can help treat alcohol use disorder by: ? Decreasing alcohol cravings. ? Decreasing the positive feeling you have when you drink alcohol. ? Causing an uncomfortable physical reaction when you drink alcohol (aversion therapy).  Support groups. Support groups are led by people who have quit drinking. They provide emotional support, advice, and guidance. These forms of treatment are often combined. Some people with this condition benefit from a combination of treatments provided by specialized substance use treatment centers. Follow these instructions at home:  Take over-the-counter and prescription medicines only as told by your health care provider.  Check with your health care provider before starting any new medicines.  Ask friends and family members not to offer you alcohol.  Avoid situations where alcohol is served, including gatherings where others are drinking alcohol.  Create a plan for what to do when you are tempted to use alcohol.  Find hobbies or activities that you enjoy that do not include alcohol.  Keep all  follow-up visits as told by your health care provider. This is important. How is this prevented?  If you drink, limit alcohol intake to no more than 1 drink a day for nonpregnant women and 2 drinks a day for men. One drink equals 12 oz of beer, 5 oz of wine, or 1 oz of hard liquor.  If you have a mental health condition, get treatment and support.  Do not give alcohol to adolescents.  If you are an adolescent: ? Do not drink alcohol. ? Do not be afraid to say no if someone offers you alcohol. Speak up about why you do not want to drink. You can be a positive role model for your friends and set a good example for those around you by not drinking alcohol. ? If your friends drink, spend time with others who do not drink alcohol. Make new friends who do not use alcohol. ? Find healthy ways to manage stress and emotions, such as meditation or deep breathing, exercise, spending time in nature, listening to music, or talking with a trusted friend or family member. Contact a health care provider if:  You are not able to take your medicines as told.  Your symptoms get worse.  You return to drinking alcohol (relapse) and your symptoms get worse. Get help right away if:  You have thoughts about hurting yourself or others. If you ever feel like you may hurt yourself or others, or have thoughts about taking  your own life, get help right away. You can go to your nearest emergency department or call:  Your local emergency services (911 in the [place]  A suicide crisis helpline, such as the [place] at (574)796-1248. This is open 24 hours a day. Summary  Alcohol use disorder is when your drinking disrupts your daily life. When you have this condition, you drink too much alcohol and you cannot control your drinking.  Treatment may include detoxification, counseling, medicine, and support groups.  Ask friends and family members not to offer you alcohol. Avoid situations  where alcohol is served.  Get help right away if you have thoughts about hurting yourself or others. This information is not intended to replace advice given to you by your health care provider. Make sure you discuss any questions you have with your health care provider. Document Revised: 07/30/2017 Document Reviewed: 05/14/2016 Elsevier Patient Education  [place].

## 2020-05-01 NOTE — Progress Notes (Signed)
Patient has taken medication today. Patient has eaten today. Patient complains of back itching

## 2020-05-04 ENCOUNTER — Emergency Department (HOSPITAL_COMMUNITY): Payer: Self-pay

## 2020-05-04 ENCOUNTER — Other Ambulatory Visit: Payer: Self-pay

## 2020-05-04 ENCOUNTER — Encounter (HOSPITAL_COMMUNITY): Payer: Self-pay

## 2020-05-04 DIAGNOSIS — S91101A Unspecified open wound of right great toe without damage to nail, initial encounter: Secondary | ICD-10-CM | POA: Insufficient documentation

## 2020-05-04 DIAGNOSIS — Z7982 Long term (current) use of aspirin: Secondary | ICD-10-CM | POA: Insufficient documentation

## 2020-05-04 DIAGNOSIS — Y939 Activity, unspecified: Secondary | ICD-10-CM | POA: Insufficient documentation

## 2020-05-04 DIAGNOSIS — I10 Essential (primary) hypertension: Secondary | ICD-10-CM | POA: Insufficient documentation

## 2020-05-04 DIAGNOSIS — S43005A Unspecified dislocation of left shoulder joint, initial encounter: Secondary | ICD-10-CM | POA: Insufficient documentation

## 2020-05-04 DIAGNOSIS — Y929 Unspecified place or not applicable: Secondary | ICD-10-CM | POA: Insufficient documentation

## 2020-05-04 DIAGNOSIS — E119 Type 2 diabetes mellitus without complications: Secondary | ICD-10-CM | POA: Insufficient documentation

## 2020-05-04 DIAGNOSIS — F10120 Alcohol abuse with intoxication, uncomplicated: Secondary | ICD-10-CM | POA: Insufficient documentation

## 2020-05-04 DIAGNOSIS — Z79899 Other long term (current) drug therapy: Secondary | ICD-10-CM | POA: Insufficient documentation

## 2020-05-04 DIAGNOSIS — W1839XA Other fall on same level, initial encounter: Secondary | ICD-10-CM | POA: Insufficient documentation

## 2020-05-04 DIAGNOSIS — Y999 Unspecified external cause status: Secondary | ICD-10-CM | POA: Insufficient documentation

## 2020-05-04 DIAGNOSIS — S31010A Laceration without foreign body of lower back and pelvis without penetration into retroperitoneum, initial encounter: Secondary | ICD-10-CM | POA: Insufficient documentation

## 2020-05-04 NOTE — ED Triage Notes (Signed)
Pt BIB [facility] from home covered in stool. Reports that he fell on the way to the bathroom and has defecated all over himself. ETOH endorsed. Given a change of clothes. Denies head injury. Reporting L shoulder pain.

## 2020-05-05 ENCOUNTER — Emergency Department (HOSPITAL_COMMUNITY): Payer: Self-pay

## 2020-05-05 ENCOUNTER — Emergency Department (HOSPITAL_COMMUNITY)
Admission: EM | Admit: 2020-05-05 | Discharge: 2020-05-05 | Disposition: A | Discharge status: Home / Self Care | Payer: Self-pay | Attending: Emergency Medicine | Admitting: Emergency Medicine

## 2020-05-05 DIAGNOSIS — F10920 Alcohol use, unspecified with intoxication, uncomplicated: Secondary | ICD-10-CM

## 2020-05-05 DIAGNOSIS — S43005A Unspecified dislocation of left shoulder joint, initial encounter: Secondary | ICD-10-CM

## 2020-05-05 LAB — CBC WITH DIFFERENTIAL/PLATELET
Abs Immature Granulocytes: 0.03 10*3/uL (ref 0.00–0.07)
Basophils Absolute: 0 10*3/uL (ref 0.0–0.1)
Basophils Relative: 0 %
Eosinophils Absolute: 0.4 10*3/uL (ref 0.0–0.5)
Eosinophils Relative: 3 %
HCT: 42 % (ref 39.0–52.0)
Hemoglobin: 14.1 g/dL (ref 13.0–17.0)
Immature Granulocytes: 0 %
Lymphocytes Relative: 8 %
Lymphs Abs: 0.9 10*3/uL (ref 0.7–4.0)
MCH: 32.3 pg (ref 26.0–34.0)
MCHC: 33.6 g/dL (ref 30.0–36.0)
MCV: 96.3 fL (ref 80.0–100.0)
Monocytes Absolute: 0.5 10*3/uL (ref 0.1–1.0)
Monocytes Relative: 5 %
Neutro Abs: 8.8 10*3/uL — ABNORMAL HIGH (ref 1.7–7.7)
Neutrophils Relative %: 84 %
Platelets: 96 10*3/uL — ABNORMAL LOW (ref 150–400)
RBC: 4.36 MIL/uL (ref 4.22–5.81)
RDW: 13.4 % (ref 11.5–15.5)
WBC: 10.6 10*3/uL — ABNORMAL HIGH (ref 4.0–10.5)
nRBC: 0 % (ref 0.0–0.2)

## 2020-05-05 LAB — BASIC METABOLIC PANEL
Anion gap: 14 (ref 5–15)
BUN: 7 mg/dL (ref 6–20)
CO2: 22 mmol/L (ref 22–32)
Calcium: 8.9 mg/dL (ref 8.9–10.3)
Chloride: 101 mmol/L (ref 98–111)
Creatinine, Ser: 0.52 mg/dL — ABNORMAL LOW (ref 0.61–1.24)
GFR calc Af Amer: >60 mL/min (ref >60)
GFR calc non Af Amer: >60 mL/min (ref >60)
Glucose, Bld: 159 mg/dL — ABNORMAL HIGH (ref 70–99)
Potassium: 3.8 mmol/L (ref 3.5–5.1)
Sodium: 137 mmol/L (ref 135–145)

## 2020-05-05 MED ORDER — AMOXICILLIN-POT CLAVULANATE 875-125 MG PO TABS: 1.0000 | ORAL_TABLET | Freq: Two times a day (BID) | ORAL | 0 refills | Status: DC

## 2020-05-05 MED ORDER — DOXYCYCLINE HYCLATE 100 MG PO CAPS: 100.0000 mg | ORAL_CAPSULE | Freq: Two times a day (BID) | ORAL | 0 refills | Status: DC

## 2020-05-05 MED ORDER — AMOXICILLIN-POT CLAVULANATE 875-125 MG PO TABS
1.0000 | ORAL_TABLET | Freq: Once | ORAL | Status: AC
  Administered 2020-05-05: 1 via ORAL
  Filled 2020-05-05: qty 1

## 2020-05-05 MED ORDER — HYDROMORPHONE HCL 1 MG/ML IJ SOLN
1.0000 mg | Freq: Once | INTRAMUSCULAR | Status: AC
  Administered 2020-05-05: 1 mg via INTRAVENOUS
  Filled 2020-05-05: qty 1

## 2020-05-05 MED ORDER — PROPOFOL 10 MG/ML IV BOLUS
0.5000 mg/kg | Freq: Once | INTRAVENOUS | Status: AC
  Administered 2020-05-05: 170 mg via INTRAVENOUS
  Filled 2020-05-05: qty 20

## 2020-05-05 MED ORDER — DOXYCYCLINE HYCLATE 100 MG PO TABS
100.0000 mg | ORAL_TABLET | Freq: Once | ORAL | Status: AC
  Administered 2020-05-05: 100 mg via ORAL
  Filled 2020-05-05: qty 1

## 2020-05-05 MED ORDER — MIDAZOLAM HCL 2 MG/2ML IJ SOLN
1.0000 mg | Freq: Once | INTRAMUSCULAR | Status: AC
  Administered 2020-05-05: 1 mg via INTRAVENOUS
  Filled 2020-05-05: qty 2

## 2020-05-05 NOTE — ED Provider Notes (Signed)
[facility] DEPT Provider Note   CSN: [id number] Arrival date & time: 05/04/20  2215     History Chief Complaint  Patient presents with  . Shoulder Pain    L    [name] [name] is a 59 y.o. male.  Patient here with left shoulder pain after falling while at home.  States he was intoxicated and fell onto his left side and was unable to get up to go to the bathroom.  He then had a bowel movement all over himself because he could not hold it.  And he was drunk.  States he had about 3 24 ounce beers last night.  Has been waiting for approximately 7 hours.  He states he has severe pain in his left shoulder but no numbness or tingling.  Denies hitting his head or losing consciousness.  No neck or back pain.  No chest pain or abdominal pain.  No history of previous shoulder dislocation but has had rotator cuff issues in the past.  No surgery on the shoulder.  Denies any other drug use.  The history is provided by the patient.  Shoulder Pain Associated symptoms: no fever        Past Medical History:  Diagnosis Date  . Barrett's esophagus dx 2016  . Bronchitis   . Depression   . Diabetes ([place])   . ED (erectile dysfunction)   . GERD (gastroesophageal reflux disease)   . Hepatitis C   . Hiatal hernia 10/2014   3cm  . Neuropathy     Patient Active Problem List   Diagnosis Date Noted  . Alcohol abuse with alcohol-induced mood disorder ([place]) 12/17/2017  . Hereditary hemochromatosis ([place]) 11/23/2017  . 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 Essential hypertension 04/02/2017  . Hypogonadism male 08/13/2016  . ETOH abuse 06/22/2016  . Low serum testosterone level 01/08/2016  . Uncontrolled type 2 diabetes mellitus without complication, without long-term current use of insulin 01/02/2016  . Erectile dysfunction 09/12/2015  . Hepatic cirrhosis ([place]) 01/10/2015  . Chronic hepatitis C without hepatic coma ([place]) 10/31/2014  . Tobacco use  disorder 08/03/2013    Past Surgical History:  Procedure Laterality Date  . APPENDECTOMY    . COLONOSCOPY N/A 02/16/2014   Procedure: COLONOSCOPY;  Surgeon: Gatha Mayer, MD;  Location: [facility] ENDOSCOPY;  Service: Endoscopy;  Laterality: N/A;  . COLONOSCOPY    . TONSILLECTOMY         Family History  Problem Relation Age of Onset  . Cancer Mother        breast  . Diabetes Mother   . Heart disease Maternal Grandfather   . Pancreatic cancer Paternal Grandmother   . Stroke Father   . Alcohol abuse Father   . Pancreatic cancer Father   . Colon cancer Neg Hx   . Stomach cancer Neg Hx   . Rectal cancer Neg Hx   . Esophageal cancer Neg Hx     Social History   Tobacco Use  . Smoking status: Current Every Day Smoker    Packs/day: 1.00    Years: 35.00    Pack years: 35.00    Types: Cigarettes  . Smokeless tobacco: Never Used  . Tobacco comment: using the patch  Vaping Use  . Vaping Use: Never used  Substance Use Topics  . Alcohol use: Yes    Alcohol/week: 27.0 standard drinks    Types: 6 Cans of beer, 21 Standard drinks or equivalent per week  Comment: 6 beers a week   . Drug use: No    Home Medications Prior to Admission medications   Medication Sig Start Date End Date Taking? Authorizing Provider  albuterol (VENTOLIN HFA) 108 (90 Base) MCG/ACT inhaler Inhale 2 puffs into the lungs every 6 (six) hours as needed for wheezing or shortness of breath. 06/16/19   [name], NP  amLODipine (NORVASC) 10 MG tablet Take 1 tablet (10 mg total) by mouth daily. 12/19/19 03/18/20  [name], NP  aspirin EC 81 MG tablet Take 1 tablet (81 mg total) by mouth daily. 04/02/17   Funches, Adriana Mccallum, MD  atorvastatin (LIPITOR) 40 MG tablet Take 1 tablet (40 mg total) by mouth daily. 12/19/19   [name], NP  canagliflozin (INVOKANA) 300 MG TABS tablet Take 1 tablet (300 mg total) by mouth daily before breakfast. 09/18/19   [name], NP  cephALEXin (KEFLEX) 500 MG  capsule Take 1 capsule (500 mg total) by mouth 4 (four) times daily. 04/17/20   Mayers, Cari S, PA-C  chlordiazePOXIDE (LIBRIUM) 25 MG capsule 50mg  PO TID x 2D, then 25-50mg  PO BID X 2D, then 25-50mg  PO QD X 1D 04/08/20   Mesner, Corene Cornea, MD  DULoxetine (CYMBALTA) 30 MG capsule TAKE 1 CAPSULE (30 MG TOTAL) BY MOUTH DAILY. 12/22/19   Newlin, Charlane Ferretti, MD  glipiZIDE (GLUCOTROL) 5 MG tablet TAKE 1 TABLET (5 MG TOTAL) BY MOUTH DAILY BEFORE BREAKFAST. 04/17/20 07/16/20  [name], NP  loperamide (IMODIUM A-D) 2 MG tablet Take 1 tablet (2 mg total) by mouth 4 (four) times daily as needed for diarrhea or loose stools. 03/01/19   [name], NP  losartan (COZAAR) 100 MG tablet Take 1 tablet (100 mg total) by mouth daily. 12/19/19   [name], NP  mirtazapine (REMERON) 15 MG tablet Take 1 tablet (15 mg total) by mouth at bedtime. 05/01/20   Mayers, Cari S, PA-C  Multiple Vitamins-Minerals (MULTIVITAMIN ADULT) TABS Take 1 tablet by mouth daily. 04/02/17   Funches, Adriana Mccallum, MD  mupirocin ointment (BACTROBAN) 2 % Apply to affected areas twice per day. Patient will pick up scripts today. 03/14/19   [name], NP  omeprazole (PRILOSEC) 40 MG capsule Take 1 capsule (40 mg total) by mouth daily. 12/19/19 03/18/20  [name], NP  ondansetron (ZOFRAN) 4 MG tablet Take 1 tablet (4 mg total) by mouth every 8 (eight) hours as needed for nausea or vomiting. 04/08/20   Mesner, Corene Cornea, MD  triamcinolone (KENALOG) 0.025 % cream APPLY 1 APPLICATION TOPICALLY 2 (TWO) TIMES DAILY. 09/14/19   Charlott Rakes, MD    Allergies    Bee venom and Lactose intolerance (gi)  Review of Systems   Review of Systems  Constitutional: Negative for activity change, appetite change and fever.  HENT: Negative for congestion and rhinorrhea.   Respiratory: Negative for cough, chest tightness and shortness of breath.   Cardiovascular: Negative for chest pain and leg swelling.  Gastrointestinal: Negative for abdominal pain, nausea  and vomiting.  Genitourinary: Negative for dysuria and hematuria.  Musculoskeletal: Positive for arthralgias and myalgias.  Skin: Negative for rash.  Neurological: Negative for dizziness, weakness and headaches.   all other systems are negative except as noted in the HPI and PMH.   Physical Exam Updated Vital Signs BP (!) 143/83 (BP Location: Right Arm)   Pulse 93   Temp 98.3 F (36.8 C) (Oral)   Resp 18   Ht 5\' 11"  (1.803 m)  Wt 77.1 kg   SpO2 97%   BMI 23.71 kg/m   Physical Exam Vitals and nursing note reviewed.  Constitutional:      General: He is not in acute distress.    Appearance: He is well-developed.     Comments: Slurred speech, no distress  HENT:     Head: Normocephalic and atraumatic.     Mouth/Throat:     Pharynx: No oropharyngeal exudate.  Eyes:     Conjunctiva/sclera: Conjunctivae normal.     Pupils: Pupils are equal, round, and reactive to light.  Neck:     Comments: No C spine tenderness Cardiovascular:     Rate and Rhythm: Normal rate and regular rhythm.     Heart sounds: Normal heart sounds. No murmur heard.   Pulmonary:     Effort: Pulmonary effort is normal. No respiratory distress.     Breath sounds: Normal breath sounds.  Abdominal:     Palpations: Abdomen is soft.     Tenderness: There is no abdominal tenderness. There is no guarding or rebound.  Musculoskeletal:        General: Swelling, tenderness and deformity present. Normal range of motion.     Cervical back: Normal range of motion and neck supple.     Comments: Squared off deformity to left shoulder with palpable glenoid fossa.  Intact radial pulse.  Intact cardinal hand movements.  Intact axillary nerve sensation  Healing abrasions/lacerations to his low back.  Lower extremities covered in stool. Full range of motion of the knees and hips bilaterally  Skin:    General: Skin is warm.     Capillary Refill: Capillary refill takes less than 2 seconds.     Comments: Healing wound to  R third toe on medial side. Serous drainage. No fluctuance or erythema.  Neurological:     General: No focal deficit present.     Mental Status: He is alert and oriented to person, place, and time. Mental status is at baseline.     Cranial Nerves: No cranial nerve deficit.     Motor: No abnormal muscle tone.     Coordination: Coordination normal.     Comments: No ataxia on finger to nose bilaterally. No pronator drift. 5/5 strength throughout. CN 2-12 intact.Equal grip strength. Sensation intact.   Psychiatric:        Behavior: Behavior normal.     ED Results / Procedures / Treatments   Labs (all labs ordered are listed, but only abnormal results are displayed) Labs Reviewed  CBC WITH DIFFERENTIAL/PLATELET - Abnormal; Notable for the following components:      Result Value   WBC 10.6 (*)    Platelets 96 (*)    Neutro Abs 8.8 (*)    All other components within normal limits  BASIC METABOLIC PANEL - Abnormal; Notable for the following components:   Glucose, Bld 159 (*)    Creatinine, Ser 0.52 (*)    All other components within normal limits  ETHANOL    EKG None  Radiology CT Head Wo Contrast  Result Date: 05/05/2020 CLINICAL DATA:  Fall, ETOH EXAM: CT HEAD WITHOUT CONTRAST CT CERVICAL SPINE WITHOUT CONTRAST TECHNIQUE: Multidetector CT imaging of the head and cervical spine was performed following the standard protocol without intravenous contrast. Multiplanar CT image reconstructions of the cervical spine were also generated. COMPARISON:  04/08/2020 FINDINGS: CT HEAD FINDINGS Brain: No evidence of acute infarction, hemorrhage, hydrocephalus, extra-axial collection or mass lesion/mass effect. Mild subcortical white matter and periventricular small vessel  ischemic changes. Vascular: Mild intracranial atherosclerosis. Skull: Normal. Negative for fracture or focal lesion. Sinuses/Orbits: Near complete opacification of the left maxillary sinus, chronic. Partial opacification of the left  mastoid air cells, chronic. Other: None. CT CERVICAL SPINE FINDINGS Alignment: Normal cervical lordosis. Skull base and vertebrae: No acute fracture. No primary bone lesion or focal pathologic process. Soft tissues and spinal canal: No prevertebral fluid or swelling. No visible canal hematoma. Disc levels: Mild to moderate degenerative changes of the mid/lower cervical spine. Spinal canal is patent. Upper chest: Centrilobular and paraseptal emphysematous changes at the lung apices. Other: Visualized thyroid is unremarkable. IMPRESSION: No evidence of acute intracranial abnormality. Mild small vessel ischemic changes. No evidence of traumatic injury to the cervical spine. Mild to moderate degenerative changes. Electronically Signed   By: Julian Hy M.D.   On: 05/05/2020 06:11   CT Cervical Spine Wo Contrast  Result Date: 05/05/2020 CLINICAL DATA:  Fall, ETOH EXAM: CT HEAD WITHOUT CONTRAST CT CERVICAL SPINE WITHOUT CONTRAST TECHNIQUE: Multidetector CT imaging of the head and cervical spine was performed following the standard protocol without intravenous contrast. Multiplanar CT image reconstructions of the cervical spine were also generated. COMPARISON:  04/08/2020 FINDINGS: CT HEAD FINDINGS Brain: No evidence of acute infarction, hemorrhage, hydrocephalus, extra-axial collection or mass lesion/mass effect. Mild subcortical white matter and periventricular small vessel ischemic changes. Vascular: Mild intracranial atherosclerosis. Skull: Normal. Negative for fracture or focal lesion. Sinuses/Orbits: Near complete opacification of the left maxillary sinus, chronic. Partial opacification of the left mastoid air cells, chronic. Other: None. CT CERVICAL SPINE FINDINGS Alignment: Normal cervical lordosis. Skull base and vertebrae: No acute fracture. No primary bone lesion or focal pathologic process. Soft tissues and spinal canal: No prevertebral fluid or swelling. No visible canal hematoma. Disc levels: Mild to  moderate degenerative changes of the mid/lower cervical spine. Spinal canal is patent. Upper chest: Centrilobular and paraseptal emphysematous changes at the lung apices. Other: Visualized thyroid is unremarkable. IMPRESSION: No evidence of acute intracranial abnormality. Mild small vessel ischemic changes. No evidence of traumatic injury to the cervical spine. Mild to moderate degenerative changes. Electronically Signed   By: Julian Hy M.D.   On: 05/05/2020 06:11   DG Shoulder Left  Result Date: 05/04/2020 CLINICAL DATA:  Left shoulder pain EXAM: LEFT SHOULDER - 2+ VIEW COMPARISON:  None. FINDINGS: Two view radiograph left shoulder demonstrates anteroinferior left shoulder dislocation no definite associated fracture. Acromioclavicular joint space is preserved. Limited evaluation of the left apex is unremarkable. IMPRESSION: Anteroinferior left shoulder dislocation. Electronically Signed   By: Fidela Salisbury MD   On: 05/04/2020 23:02    Procedures .Sedation  Date/Time: 05/05/2020 6:36 AM Performed by: Ezequiel Essex, MD Authorized by: Ezequiel Essex, MD   Consent:    Consent obtained:  Verbal   Consent given by:  Patient   Risks discussed:  Allergic reaction, dysrhythmia, inadequate sedation, nausea, prolonged hypoxia resulting in organ damage, prolonged sedation necessitating reversal, respiratory compromise necessitating ventilatory assistance and intubation and vomiting   Alternatives discussed:  Analgesia without sedation, anxiolysis and regional anesthesia Universal protocol:    Procedure explained and questions answered to patient or proxy's satisfaction: yes     Relevant documents present and verified: yes     Test results available and properly labeled: yes     Imaging studies available: yes     Required blood products, implants, devices, and special equipment available: yes     Site/side marked: yes     Immediately prior to procedure a  time out was called: yes     Patient  identity confirmation method:  Verbally with patient Indications:    Procedure performed:  Dislocation reduction   Procedure necessitating sedation performed by:  Physician performing sedation Pre-sedation assessment:    Time since last food or drink:  7   ASA classification: class 2 - patient with mild systemic disease     Neck mobility: normal     Mouth opening:  3 or more finger widths   Thyromental distance:  4 finger widths   Mallampati score:  I - soft palate, uvula, fauces, pillars visible   Pre-sedation assessments completed and reviewed: airway patency, cardiovascular function, hydration status, mental status, nausea/vomiting, pain level, respiratory function and temperature     Pre-sedation assessment completed:  05/05/2020 6:14 AM Immediate pre-procedure details:    Reassessment: Patient reassessed immediately prior to procedure     Reviewed: vital signs, relevant labs/tests and NPO status     Verified: bag valve mask available, emergency equipment available, intubation equipment available, IV patency confirmed, oxygen available and suction available   Procedure details (see MAR for exact dosages):    Preoxygenation:  Nasal cannula   Sedation:  Propofol and midazolam   Intended level of sedation: deep   Analgesia:  Hydromorphone   Intra-procedure monitoring:  Blood pressure monitoring, cardiac monitor, continuous pulse oximetry, frequent LOC assessments, frequent vital sign checks and continuous capnometry   Intra-procedure events: none     Total Provider sedation time (minutes):  15 Post-procedure details:    Post-sedation assessment completed:  05/05/2020 6:36 AM   Attendance: Constant attendance by certified staff until patient recovered     Recovery: Patient returned to pre-procedure baseline     Post-sedation assessments completed and reviewed: airway patency, cardiovascular function, hydration status, mental status, nausea/vomiting, pain level, respiratory function and  temperature     Patient is stable for discharge or admission: yes     Patient tolerance:  Tolerated well, no immediate complications Reduction of dislocation  Date/Time: 05/05/2020 6:37 AM Performed by: Ezequiel Essex, MD Authorized by: Ezequiel Essex, MD  Preparation: Patient was prepped and draped in the usual sterile fashion.  Sedation: Patient sedated: yes Sedation type: moderate (conscious) sedation Sedatives: propofol and midazolam Analgesia: hydromorphone Sedation start date/time: 05/05/2020 6:14 AM Sedation end date/time: 05/05/2020 6:37 AM Vitals: Vital signs were monitored during sedation.  Patient tolerance: patient tolerated the procedure well with no immediate complications    (including critical care time)  Medications Ordered in ED Medications  HYDROmorphone (DILAUDID) injection 1 mg (has no administration in time range)  midazolam (VERSED) injection 1 mg (has no administration in time range)  propofol (DIPRIVAN) 10 mg/mL bolus/IV push 38.6 mg (has no administration in time range)    ED Course  I have reviewed the triage vital signs and the nursing notes.  Pertinent labs & imaging results that were available during my care of the patient were reviewed by me and considered in my medical decision making (see chart for details).    MDM Rules/Calculators/A&P                         Fall while intoxicated onto his left shoulder.  Pulses intact.  Clinically has a dislocation.  No fracture on x-ray.  Patient has been sobering for the past 7 hours.  Denies hitting his head.  Attempted reduction of shoulder without sedation was unsuccessful.  Patient has been dislocated for over 8 hours.  We  will proceed with sedation.  Sedation performed as above.  Patient tolerated well.  Required 170 mg of propofol.  Successful shoulder reduction as above.  Patient informed nursing staff he still has a wound to his right third toe after sustaining a laceration there last month.   Sutures were removed at the mobile clinic this past week. Wound has mild serous drainage.  There is no overlying erythema or fluctuance.  Given his history of diabetes will place antibiotics.  Obtain x-ray to rule out fracture.  Patient placed on antibiotics for his toe wound.  He states he is up-to-date on tetanus.  He is awaiting metabolism of his propofol and will be discharged when he is awake and alert.  He will follow-up with his PCP as well as orthopedics.  Care to Be transferred at shift change to Dr. Tamera Punt.    Final Clinical Impression(s) / ED Diagnoses Final diagnoses:  Dislocation of left shoulder joint, initial encounter  Alcoholic intoxication without complication ([place])    Rx / DC Orders ED Discharge Orders         Ordered    amoxicillin-clavulanate (AUGMENTIN) 875-125 MG tablet  Every 12 hours        05/05/20 0714    doxycycline (VIBRAMYCIN) 100 MG capsule  2 times daily        05/05/20 0714           Gracey Tolle, Annie Main, MD 05/05/20 [phone]

## 2020-05-05 NOTE — Discharge Instructions (Addendum)
Reduce your alcohol intake. Take the antibiotics for your toe. Wear the sling as prescribed and follow-up with orthopedic doctor.  Return to the ED with new or worsening symptoms.

## 2020-05-05 NOTE — ED Notes (Signed)
Pt awake and alert.

## 2020-05-05 NOTE — ED Notes (Signed)
Pt discharged from this ED in stable condition at this time. All discharge instructions and follow up care reviewed with pt with no further questions at this time. Pt ambulatory with steady gait, clear speech.  

## 2020-05-05 NOTE — ED Provider Notes (Signed)
Care was taken over from Dr. Wyvonnia Dusky while awaiting patient to metabolize his EtOH.  He had presented after a fall with a dislocated shoulder.  Shoulder was reduced by Dr. Wyvonnia Dusky.  He also has a laceration that is older appearing to his toe.  X-rays show no underlying fracture or signs of osteomyelitis.  There is no suggestions of surrounding cellulitis.  He is able to ambulate without difficulty.  No ataxia.  He is alert and oriented with some very minimal tremors.  He was discharged home in good condition.  He was given discharge instructions per Dr. Wyvonnia Dusky.   Malvin Johns, MD 05/05/20 [phone]

## 2020-05-05 NOTE — ED Notes (Signed)
Pt set up for conscious sedation

## 2020-05-05 NOTE — ED Notes (Signed)
Time out performed for conscious sedation with Dr Wyvonnia Dusky and RT Claiborne Billings).

## 2020-05-08 ENCOUNTER — Ambulatory Visit: Payer: Self-pay | Admitting: Family Medicine

## 2020-05-08 ENCOUNTER — Ambulatory Visit: Payer: Self-pay | Admitting: *Deleted

## 2020-05-08 NOTE — Telephone Encounter (Signed)
Patient with watery stools and vomiting since last night. 5 stools and vomited twice. No blood noted in either. No abdominal pain. Possible it may be food poisoning from left over and left out pizza from Saturday that he ate several hours before the diarrhea/vomiting. Pizza had several meat toppings. Has not checked temperature yet. Denies dizziness/weakness.Voided twice today. Care Advice including sips of water/gatorade hourly. If tolerated try only bites of crackers/bland foods this evening.Reviewed urgent symptoms he should seek immediate evaluation if arises.   Reason for Disposition  MILD-MODERATE diarrhea (e.g., 1-6 times / day more than normal)  Answer Assessment - Initial Assessment Questions 1. DIARRHEA SEVERITY: "How bad is the diarrhea?" "How many extra stools have you had in the past 24 hours than normal?"    - NO DIARRHEA (SCALE 0)   - MILD (SCALE 1-3): Few loose or mushy BMs; increase of 1-3 stools over normal daily number of stools; mild increase in ostomy output.   -  MODERATE (SCALE 4-7): Increase of 4-6 stools daily over normal; moderate increase in ostomy output. * SEVERE (SCALE 8-10; OR 'WORST POSSIBLE'): Increase of 7 or more stools daily over normal; moderate increase in ostomy output; incontinence.     Mostly watery stools, 5 total 2. ONSET: "When did the diarrhea begin?"     During the night 3. BM CONSISTENCY: "How loose or watery is the diarrhea?"      Mostly watery 4. VOMITING: "Are you also vomiting?" If Yes, ask: "How many times in the past 24 hours?"      Yes, twice today 5. ABDOMINAL PAIN: "Are you having any abdominal pain?" If Yes, ask: "What does it feel like?" (e.g., crampy, dull, intermittent, constant)      No abdominal pain 6. ABDOMINAL PAIN SEVERITY: If present, ask: "How bad is the pain?"  (e.g., Scale 1-10; mild, moderate, or severe)   - MILD (1-3): doesn't interfere with normal activities, abdomen soft and not tender to touch    - MODERATE (4-7):  interferes with normal activities or awakens from sleep, tender to touch    - SEVERE (8-10): excruciating pain, doubled over, unable to do any normal activities       No pain 7. ORAL INTAKE: If vomiting, "Have you been able to drink liquids?" "How much fluids have you had in the past 24 hours?"     One glass since the last vomiting 8. HYDRATION: "Any signs of dehydration?" (e.g., dry mouth [not just dry lips], too weak to stand, dizziness, new weight loss) "When did you last urinate?"     Yes, voiding 9. EXPOSURE: "Have you traveled to a foreign country recently?" "Have you been exposed to anyone with diarrhea?" "Could you have eaten any food that was spoiled?"     Possibly food poisoning with left over and left out pizza. 10. ANTIBIOTIC USE: "Are you taking antibiotics now or have you taken antibiotics in the past 2 months?"       11. OTHER SYMPTOMS: "Do you have any other symptoms?" (e.g., fever, blood in stool)       no 12. PREGNANCY: "Is there any chance you are pregnant?" "When was your last menstrual period?"      ns  Protocols used: DIARRHEA-A-AH

## 2020-05-12 ENCOUNTER — Inpatient Hospital Stay (HOSPITAL_COMMUNITY)
Admission: EM | Admit: 2020-05-12 | Discharge: 2020-05-21 | DRG: 193 | Disposition: A | Discharge status: Skilled Nursing Facility | Payer: Self-pay | Attending: Internal Medicine | Admitting: Internal Medicine

## 2020-05-12 ENCOUNTER — Emergency Department (HOSPITAL_COMMUNITY): Payer: Self-pay

## 2020-05-12 ENCOUNTER — Other Ambulatory Visit: Payer: Self-pay

## 2020-05-12 ENCOUNTER — Encounter (HOSPITAL_COMMUNITY): Payer: Self-pay

## 2020-05-12 DIAGNOSIS — J440 Chronic obstructive pulmonary disease with acute lower respiratory infection: Secondary | ICD-10-CM | POA: Diagnosis present

## 2020-05-12 DIAGNOSIS — F329 Major depressive disorder, single episode, unspecified: Secondary | ICD-10-CM | POA: Diagnosis present

## 2020-05-12 DIAGNOSIS — K2100 Gastro-esophageal reflux disease with esophagitis, without bleeding: Secondary | ICD-10-CM

## 2020-05-12 DIAGNOSIS — F10931 Alcohol use, unspecified with withdrawal delirium: Secondary | ICD-10-CM | POA: Diagnosis present

## 2020-05-12 DIAGNOSIS — J0100 Acute maxillary sinusitis, unspecified: Secondary | ICD-10-CM

## 2020-05-12 DIAGNOSIS — E1169 Type 2 diabetes mellitus with other specified complication: Secondary | ICD-10-CM | POA: Diagnosis present

## 2020-05-12 DIAGNOSIS — J181 Lobar pneumonia, unspecified organism: Principal | ICD-10-CM | POA: Diagnosis present

## 2020-05-12 DIAGNOSIS — K219 Gastro-esophageal reflux disease without esophagitis: Secondary | ICD-10-CM | POA: Diagnosis present

## 2020-05-12 DIAGNOSIS — Z811 Family history of alcohol abuse and dependence: Secondary | ICD-10-CM

## 2020-05-12 DIAGNOSIS — Z823 Family history of stroke: Secondary | ICD-10-CM

## 2020-05-12 DIAGNOSIS — Y900 Blood alcohol level of less than 20 mg/100 ml: Secondary | ICD-10-CM | POA: Diagnosis present

## 2020-05-12 DIAGNOSIS — G9341 Metabolic encephalopathy: Secondary | ICD-10-CM | POA: Diagnosis present

## 2020-05-12 DIAGNOSIS — E876 Hypokalemia: Secondary | ICD-10-CM | POA: Diagnosis present

## 2020-05-12 DIAGNOSIS — F1010 Alcohol abuse, uncomplicated: Secondary | ICD-10-CM | POA: Diagnosis present

## 2020-05-12 DIAGNOSIS — K7460 Unspecified cirrhosis of liver: Secondary | ICD-10-CM | POA: Diagnosis present

## 2020-05-12 DIAGNOSIS — Z20822 Contact with and (suspected) exposure to covid-19: Secondary | ICD-10-CM | POA: Diagnosis present

## 2020-05-12 DIAGNOSIS — B1920 Unspecified viral hepatitis C without hepatic coma: Secondary | ICD-10-CM | POA: Diagnosis present

## 2020-05-12 DIAGNOSIS — Z833 Family history of diabetes mellitus: Secondary | ICD-10-CM

## 2020-05-12 DIAGNOSIS — R5381 Other malaise: Secondary | ICD-10-CM

## 2020-05-12 DIAGNOSIS — A419 Sepsis, unspecified organism: Secondary | ICD-10-CM

## 2020-05-12 DIAGNOSIS — R6510 Systemic inflammatory response syndrome (SIRS) of non-infectious origin without acute organ dysfunction: Secondary | ICD-10-CM | POA: Diagnosis present

## 2020-05-12 DIAGNOSIS — Z7982 Long term (current) use of aspirin: Secondary | ICD-10-CM

## 2020-05-12 DIAGNOSIS — Z09 Encounter for follow-up examination after completed treatment for conditions other than malignant neoplasm: Secondary | ICD-10-CM

## 2020-05-12 DIAGNOSIS — F17210 Nicotine dependence, cigarettes, uncomplicated: Secondary | ICD-10-CM | POA: Diagnosis present

## 2020-05-12 DIAGNOSIS — E739 Lactose intolerance, unspecified: Secondary | ICD-10-CM | POA: Diagnosis present

## 2020-05-12 DIAGNOSIS — E871 Hypo-osmolality and hyponatremia: Secondary | ICD-10-CM | POA: Diagnosis present

## 2020-05-12 DIAGNOSIS — K760 Fatty (change of) liver, not elsewhere classified: Secondary | ICD-10-CM | POA: Diagnosis present

## 2020-05-12 DIAGNOSIS — K7010 Alcoholic hepatitis without ascites: Secondary | ICD-10-CM | POA: Diagnosis present

## 2020-05-12 DIAGNOSIS — I10 Essential (primary) hypertension: Secondary | ICD-10-CM | POA: Diagnosis present

## 2020-05-12 DIAGNOSIS — F32A Depression, unspecified: Secondary | ICD-10-CM

## 2020-05-12 DIAGNOSIS — J189 Pneumonia, unspecified organism: Secondary | ICD-10-CM | POA: Diagnosis present

## 2020-05-12 DIAGNOSIS — K449 Diaphragmatic hernia without obstruction or gangrene: Secondary | ICD-10-CM | POA: Diagnosis present

## 2020-05-12 DIAGNOSIS — R7989 Other specified abnormal findings of blood chemistry: Secondary | ICD-10-CM

## 2020-05-12 DIAGNOSIS — J320 Chronic maxillary sinusitis: Secondary | ICD-10-CM | POA: Diagnosis present

## 2020-05-12 DIAGNOSIS — E785 Hyperlipidemia, unspecified: Secondary | ICD-10-CM | POA: Diagnosis present

## 2020-05-12 DIAGNOSIS — J9601 Acute respiratory failure with hypoxia: Secondary | ICD-10-CM | POA: Diagnosis present

## 2020-05-12 DIAGNOSIS — F10231 Alcohol dependence with withdrawal delirium: Secondary | ICD-10-CM | POA: Diagnosis present

## 2020-05-12 DIAGNOSIS — K2270 Barrett's esophagus without dysplasia: Secondary | ICD-10-CM | POA: Diagnosis present

## 2020-05-12 DIAGNOSIS — Z79899 Other long term (current) drug therapy: Secondary | ICD-10-CM

## 2020-05-12 DIAGNOSIS — Z751 Person awaiting admission to adequate facility elsewhere: Secondary | ICD-10-CM

## 2020-05-12 DIAGNOSIS — Z8249 Family history of ischemic heart disease and other diseases of the circulatory system: Secondary | ICD-10-CM

## 2020-05-12 DIAGNOSIS — Z91030 Bee allergy status: Secondary | ICD-10-CM

## 2020-05-12 LAB — APTT: aPTT: 43 seconds — ABNORMAL HIGH (ref 24–36)

## 2020-05-12 LAB — COMPREHENSIVE METABOLIC PANEL
ALT: 129 U/L — ABNORMAL HIGH (ref 0–44)
AST: 376 U/L — ABNORMAL HIGH (ref 15–41)
Albumin: 2.8 g/dL — ABNORMAL LOW (ref 3.5–5.0)
Alkaline Phosphatase: 71 U/L (ref 38–126)
Anion gap: 13 (ref 5–15)
BUN: 18 mg/dL (ref 6–20)
CO2: 25 mmol/L (ref 22–32)
Calcium: 8.3 mg/dL — ABNORMAL LOW (ref 8.9–10.3)
Chloride: 86 mmol/L — ABNORMAL LOW (ref 98–111)
Creatinine, Ser: 0.48 mg/dL — ABNORMAL LOW (ref 0.61–1.24)
GFR calc Af Amer: >60 mL/min (ref >60)
GFR calc non Af Amer: >60 mL/min (ref >60)
Glucose, Bld: 163 mg/dL — ABNORMAL HIGH (ref 70–99)
Potassium: 2.6 mmol/L — CL (ref 3.5–5.1)
Sodium: 124 mmol/L — ABNORMAL LOW (ref 135–145)
Total Bilirubin: 2.2 mg/dL — ABNORMAL HIGH (ref 0.3–1.2)
Total Protein: 6.9 g/dL (ref 6.5–8.1)

## 2020-05-12 LAB — PROTIME-INR
INR: 1.3 — ABNORMAL HIGH (ref 0.8–1.2)
Prothrombin Time: 15.6 seconds — ABNORMAL HIGH (ref 11.4–15.2)

## 2020-05-12 LAB — CBC WITH DIFFERENTIAL/PLATELET
Abs Immature Granulocytes: 0 10*3/uL (ref 0.00–0.07)
Band Neutrophils: 16 %
Basophils Absolute: 0 10*3/uL (ref 0.0–0.1)
Basophils Relative: 0 %
Eosinophils Absolute: 0 10*3/uL (ref 0.0–0.5)
Eosinophils Relative: 0 %
HCT: 35.3 % — ABNORMAL LOW (ref 39.0–52.0)
Hemoglobin: 12.5 g/dL — ABNORMAL LOW (ref 13.0–17.0)
Lymphocytes Relative: 5 %
Lymphs Abs: 0.6 10*3/uL — ABNORMAL LOW (ref 0.7–4.0)
MCH: 31.5 pg (ref 26.0–34.0)
MCHC: 35.4 g/dL (ref 30.0–36.0)
MCV: 88.9 fL (ref 80.0–100.0)
Monocytes Absolute: 0.6 10*3/uL (ref 0.1–1.0)
Monocytes Relative: 5 %
Neutro Abs: 10.5 10*3/uL — ABNORMAL HIGH (ref 1.7–7.7)
Neutrophils Relative %: 74 %
Platelets: 147 10*3/uL — ABNORMAL LOW (ref 150–400)
RBC: 3.97 MIL/uL — ABNORMAL LOW (ref 4.22–5.81)
RDW: 12.4 % (ref 11.5–15.5)
WBC: 11.7 10*3/uL — ABNORMAL HIGH (ref 4.0–10.5)
nRBC: 0 % (ref 0.0–0.2)

## 2020-05-12 LAB — ETHANOL: Alcohol, Ethyl (B): <10 mg/dL (ref <10)

## 2020-05-12 LAB — BLOOD GAS, VENOUS
Acid-Base Excess: 5.1 mmol/L — ABNORMAL HIGH (ref 0.0–2.0)
Bicarbonate: 27.2 mmol/L (ref 20.0–28.0)
O2 Saturation: 88.6 %
Patient temperature: 98.6
pCO2, Ven: 31.3 mmHg — ABNORMAL LOW (ref 44.0–60.0)
pH, Ven: 7.549 — ABNORMAL HIGH (ref 7.250–7.430)
pO2, Ven: 51.9 mmHg — ABNORMAL HIGH (ref 32.0–45.0)

## 2020-05-12 LAB — CBG MONITORING, ED
Glucose-Capillary: 275 mg/dL — ABNORMAL HIGH (ref 70–99)
Glucose-Capillary: 148 mg/dL — ABNORMAL HIGH (ref 70–99)

## 2020-05-12 LAB — MAGNESIUM: Magnesium: 2.5 mg/dL — ABNORMAL HIGH (ref 1.7–2.4)

## 2020-05-12 LAB — LACTIC ACID, PLASMA: Lactic Acid, Venous: 1.5 mmol/L (ref 0.5–1.9)

## 2020-05-12 LAB — AMMONIA: Ammonia: 50 umol/L — ABNORMAL HIGH (ref 9–35)

## 2020-05-12 MED ORDER — LACTATED RINGERS IV BOLUS (SEPSIS)
1000.0000 mL | Freq: Once | INTRAVENOUS | Status: AC
  Administered 2020-05-12: 1000 mL via INTRAVENOUS

## 2020-05-12 MED ORDER — LACTATED RINGERS IV SOLN: INTRAVENOUS | Status: DC

## 2020-05-12 MED ORDER — SODIUM CHLORIDE 0.9 % IV SOLN
1.0000 g | Freq: Once | INTRAVENOUS | Status: AC
  Administered 2020-05-12: 1 g via INTRAVENOUS
  Filled 2020-05-12: qty 10

## 2020-05-12 MED ORDER — SODIUM CHLORIDE 0.9 % IV SOLN
500.0000 mg | Freq: Once | INTRAVENOUS | Status: AC
  Administered 2020-05-12: 500 mg via INTRAVENOUS
  Filled 2020-05-12: qty 500

## 2020-05-12 MED ORDER — THIAMINE HCL 100 MG/ML IJ SOLN
100.0000 mg | Freq: Once | INTRAMUSCULAR | Status: AC
  Administered 2020-05-12: 100 mg via INTRAVENOUS
  Filled 2020-05-12: qty 2

## 2020-05-12 MED ORDER — POTASSIUM CHLORIDE 10 MEQ/100ML IV SOLN
10.0000 meq | INTRAVENOUS | Status: AC
  Administered 2020-05-12 – 2020-05-13 (×4): 10 meq via INTRAVENOUS
  Filled 2020-05-12 (×4): qty 100

## 2020-05-12 NOTE — ED Provider Notes (Signed)
[facility] DEPT Provider Note   CSN: [id number] Arrival date & time: 05/12/20  1845     History Chief Complaint  Patient presents with  . Fall    JEWEL VENDITTO is a 59 y.o. male.  The history is provided by the patient, the EMS personnel and medical records. No language interpreter was used.   UNDRAY ALLMAN is a 59 y.o. male who presents to the Emergency Department complaining of altered mental status. Level V caveat due to altered mental status. History is provided by EMS and the patient. EMS reports that he had a fall at home. He lives with his mother. Mother is concerned for alcohol withdrawal. Per family last drink was several days to a week ago. Patient states that he is been experiencing diarrhea for the last few days he does have some mild left lower quadrant pain. No reports of fever, cough, difficulty breathing, nausea, vomiting, dysuria. No known COVID 19 exposures. He states that he was scared at home because he thought there were a lot of people in the house when he woke up this morning. EMS reports fever to 103 prior to ED arrival. He received 500 mL saline prior to ED arrival.    Past Medical History:  Diagnosis Date  . Barrett's esophagus dx 2016  . Bronchitis   . Depression   . Diabetes ([place])   . ED (erectile dysfunction)   . GERD (gastroesophageal reflux disease)   . Hepatitis C   . Hiatal hernia 10/2014   3cm  . Neuropathy     Patient Active Problem List   Diagnosis Date Noted  . Alcohol abuse with alcohol-induced mood disorder ([place]) 12/17/2017  . Hereditary hemochromatosis ([place]) 11/23/2017  . 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 Essential hypertension 04/02/2017  . Hypogonadism male 08/13/2016  . ETOH abuse 06/22/2016  . Low serum testosterone level 01/08/2016  . Uncontrolled type 2 diabetes mellitus without complication, without long-term current use of insulin 01/02/2016  . Erectile dysfunction  09/12/2015  . Hepatic cirrhosis ([place]) 01/10/2015  . Chronic hepatitis C without hepatic coma ([place]) 10/31/2014  . Tobacco use disorder 08/03/2013    Past Surgical History:  Procedure Laterality Date  . APPENDECTOMY    . COLONOSCOPY N/A 02/16/2014   Procedure: COLONOSCOPY;  Surgeon: Gatha Mayer, MD;  Location: [facility] ENDOSCOPY;  Service: Endoscopy;  Laterality: N/A;  . COLONOSCOPY    . TONSILLECTOMY         Family History  Problem Relation Age of Onset  . Cancer Mother        breast  . Diabetes Mother   . Heart disease Maternal Grandfather   . Pancreatic cancer Paternal Grandmother   . Stroke Father   . Alcohol abuse Father   . Pancreatic cancer Father   . Colon cancer Neg Hx   . Stomach cancer Neg Hx   . Rectal cancer Neg Hx   . Esophageal cancer Neg Hx     Social History   Tobacco Use  . Smoking status: Current Every Day Smoker    Packs/day: 1.00    Years: 35.00    Pack years: 35.00    Types: Cigarettes  . Smokeless tobacco: Never Used  . Tobacco comment: using the patch  Vaping Use  . Vaping Use: Never used  Substance Use Topics  . Alcohol use: Yes    Alcohol/week: 27.0 standard drinks    Types: 6 Cans of beer, 21 Standard drinks or  equivalent per week    Comment: 6 beers a week   . Drug use: No    Home Medications Prior to Admission medications   Medication Sig Start Date End Date Taking? Authorizing Provider  albuterol (VENTOLIN HFA) 108 (90 Base) MCG/ACT inhaler Inhale 2 puffs into the lungs every 6 (six) hours as needed for wheezing or shortness of breath. 06/16/19   [name], NP  amLODipine (NORVASC) 10 MG tablet Take 1 tablet (10 mg total) by mouth daily. 12/19/19 03/18/20  [name], NP  amoxicillin-clavulanate (AUGMENTIN) 875-125 MG tablet Take 1 tablet by mouth every 12 (twelve) hours. 05/05/20   Rancour, Annie Main, MD  aspirin EC 81 MG tablet Take 1 tablet (81 mg total) by mouth daily. 04/02/17   Funches, Adriana Mccallum, MD  atorvastatin (LIPITOR)  40 MG tablet Take 1 tablet (40 mg total) by mouth daily. 12/19/19   [name], NP  canagliflozin (INVOKANA) 300 MG TABS tablet Take 1 tablet (300 mg total) by mouth daily before breakfast. 09/18/19   [name], NP  cephALEXin (KEFLEX) 500 MG capsule Take 1 capsule (500 mg total) by mouth 4 (four) times daily. 04/17/20   Mayers, Cari S, PA-C  chlordiazePOXIDE (LIBRIUM) 25 MG capsule 50mg  PO TID x 2D, then 25-50mg  PO BID X 2D, then 25-50mg  PO QD X 1D 04/08/20   Mesner, Corene Cornea, MD  doxycycline (VIBRAMYCIN) 100 MG capsule Take 1 capsule (100 mg total) by mouth 2 (two) times daily. 05/05/20   Rancour, Annie Main, MD  DULoxetine (CYMBALTA) 30 MG capsule TAKE 1 CAPSULE (30 MG TOTAL) BY MOUTH DAILY. 12/22/19   Newlin, Charlane Ferretti, MD  glipiZIDE (GLUCOTROL) 5 MG tablet TAKE 1 TABLET (5 MG TOTAL) BY MOUTH DAILY BEFORE BREAKFAST. 04/17/20 07/16/20  [name], NP  loperamide (IMODIUM A-D) 2 MG tablet Take 1 tablet (2 mg total) by mouth 4 (four) times daily as needed for diarrhea or loose stools. 03/01/19   [name], NP  losartan (COZAAR) 100 MG tablet Take 1 tablet (100 mg total) by mouth daily. 12/19/19   [name], NP  mirtazapine (REMERON) 15 MG tablet Take 1 tablet (15 mg total) by mouth at bedtime. 05/01/20   Mayers, Cari S, PA-C  Multiple Vitamins-Minerals (MULTIVITAMIN ADULT) TABS Take 1 tablet by mouth daily. 04/02/17   Funches, Adriana Mccallum, MD  mupirocin ointment (BACTROBAN) 2 % Apply to affected areas twice per day. Patient will pick up scripts today. 03/14/19   [name], NP  omeprazole (PRILOSEC) 40 MG capsule Take 1 capsule (40 mg total) by mouth daily. 12/19/19 03/18/20  [name], NP  ondansetron (ZOFRAN) 4 MG tablet Take 1 tablet (4 mg total) by mouth every 8 (eight) hours as needed for nausea or vomiting. 04/08/20   Mesner, Corene Cornea, MD  triamcinolone (KENALOG) 0.025 % cream APPLY 1 APPLICATION TOPICALLY 2 (TWO) TIMES DAILY. 09/14/19   Charlott Rakes, MD    Allergies    Bee venom  and Lactose intolerance (gi)  Review of Systems   Review of Systems  All other systems reviewed and are negative.   Physical Exam Updated Vital Signs BP 135/80   Pulse (!) 107   Temp (!) 102.5 F (39.2 C) (Rectal)   Resp (!) 35   Ht 5\' 11"  (1.803 m)   Wt 81.2 kg   SpO2 93%   BMI 24.97 kg/m   Physical Exam Vitals and nursing note reviewed.  Constitutional:      General: He is in  acute distress.     Appearance: He is well-developed. He is ill-appearing.  HENT:     Head: Normocephalic and atraumatic.  Cardiovascular:     Rate and Rhythm: Regular rhythm. Tachycardia present.     Heart sounds: No murmur heard.   Pulmonary:     Effort: Pulmonary effort is normal. No respiratory distress.     Breath sounds: Normal breath sounds.  Abdominal:     Palpations: Abdomen is soft.     Tenderness: There is no abdominal tenderness. There is no guarding or rebound.  Musculoskeletal:        General: No tenderness.  Skin:    General: Skin is warm and dry.  Neurological:     Mental Status: He is alert.     Comments: Oriented to person in place. Moves all extremities symmetrically. Mildly confused.       ED Results / Procedures / Treatments   Labs (all labs ordered are listed, but only abnormal results are displayed) Labs Reviewed  COMPREHENSIVE METABOLIC PANEL - Abnormal; Notable for the following components:      Result Value   Sodium 124 (*)    Potassium 2.6 (*)    Chloride 86 (*)    Glucose, Bld 163 (*)    Creatinine, Ser 0.48 (*)    Calcium 8.3 (*)    Albumin 2.8 (*)    AST 376 (*)    ALT 129 (*)    Total Bilirubin 2.2 (*)    All other components within normal limits  CBC WITH DIFFERENTIAL/PLATELET - Abnormal; Notable for the following components:   WBC 11.7 (*)    RBC 3.97 (*)    Hemoglobin 12.5 (*)    HCT 35.3 (*)    Platelets 147 (*)    Neutro Abs 10.5 (*)    Lymphs Abs 0.6 (*)    All other components within normal limits  PROTIME-INR - Abnormal; Notable for  the following components:   Prothrombin Time 15.6 (*)    INR 1.3 (*)    All other components within normal limits  APTT - Abnormal; Notable for the following components:   aPTT 43 (*)    All other components within normal limits  BLOOD GAS, VENOUS - Abnormal; Notable for the following components:   pH, Ven 7.549 (*)    pCO2, Ven 31.3 (*)    pO2, Ven 51.9 (*)    Acid-Base Excess 5.1 (*)    All other components within normal limits  AMMONIA - Abnormal; Notable for the following components:   Ammonia 50 (*)    All other components within normal limits  MAGNESIUM - Abnormal; Notable for the following components:   Magnesium 2.5 (*)    All other components within normal limits  CBG MONITORING, ED - Abnormal; Notable for the following components:   Glucose-Capillary 275 (*)    All other components within normal limits  CBG MONITORING, ED - Abnormal; Notable for the following components:   Glucose-Capillary 148 (*)    All other components within normal limits  CULTURE, BLOOD (SINGLE)  URINE CULTURE  SARS CORONAVIRUS 2 BY RT PCR (HOSPITAL ORDER, [facility])  CULTURE, BLOOD (SINGLE)  LACTIC ACID, PLASMA  ETHANOL  LACTIC ACID, PLASMA  URINALYSIS, ROUTINE W REFLEX MICROSCOPIC    EKG EKG Interpretation  Date/Time:  Sunday May 12 2020 20:51:15 EDT Ventricular Rate:  115 PR Interval:    QRS Duration: 92 QT Interval:  344 QTC Calculation: 476 R Axis:  29 Text Interpretation: Sinus tachycardia RSR' in V1 or V2, probably normal variant Minimal ST depression, anterolateral leads Borderline prolonged QT interval Confirmed by Quintella Reichert [phone]) on 05/12/2020 8:55:11 PM   Radiology CT Head Wo Contrast  Result Date: 05/12/2020 CLINICAL DATA:  Altered mental status and subsequent fall. EXAM: CT HEAD WITHOUT CONTRAST TECHNIQUE: Contiguous axial images were obtained from the base of the skull through the vertex without intravenous contrast. COMPARISON:   May 05, 2020 FINDINGS: Brain: There is mild cerebral atrophy with widening of the extra-axial spaces and ventricular dilatation. There are areas of decreased attenuation within the white matter tracts of the supratentorial brain, consistent with microvascular disease changes. Vascular: No hyperdense vessel or unexpected calcification. Skull: Normal. Negative for fracture or focal lesion. Sinuses/Orbits: There is moderate to marked severity left maxillary sinus mucosal thickening. Other: None. IMPRESSION: 1. Generalized cerebral atrophy. 2. No acute intracranial abnormality. 3. Moderate to marked severity left maxillary sinus disease. Electronically Signed   By: Virgina Norfolk M.D.   On: 05/12/2020 21:36   DG Chest Port 1 View  Result Date: 05/12/2020 CLINICAL DATA:  Sepsis. EXAM: PORTABLE CHEST 1 VIEW COMPARISON:  March 14, 2019 FINDINGS: There is an ill-defined, somewhat hazy right hilar/infrahilar airspace opacity. There is fullness of the right hilum. The left lung field is essentially clear. The heart size is normal. Aortic calcifications are noted. There are advanced degenerative changes of the right glenohumeral joint. IMPRESSION: Right hilar/infrahilar airspace opacity concerning for pneumonia. There is some fullness of the right hilum which may be secondary to adenopathy. Follow-up to radiologic resolution is recommended. Electronically Signed   By: Constance Holster M.D.   On: 05/12/2020 19:39    Procedures Procedures (including critical care time) CRITICAL CARE Performed by: Quintella Reichert   Total critical care time: 35 minutes  Critical care time was exclusive of separately billable procedures and treating other patients.  Critical care was necessary to treat or prevent imminent or life-threatening deterioration.  Critical care was time spent personally by me on the following activities: development of treatment plan with patient and/or surrogate as well as nursing, discussions  with consultants, evaluation of patient's response to treatment, examination of patient, obtaining history from patient or surrogate, ordering and performing treatments and interventions, ordering and review of laboratory studies, ordering and review of radiographic studies, pulse oximetry and re-evaluation of patient's condition.  Medications Ordered in ED Medications  lactated ringers infusion ( Intravenous New Bag/Given 05/12/20 2327)  potassium chloride 10 mEq in 100 mL IVPB (has no administration in time range)  lactated ringers bolus 1,000 mL (0 mLs Intravenous Stopped 05/12/20 2327)  thiamine (B-1) injection 100 mg (100 mg Intravenous Given 05/12/20 2135)  cefTRIAXone (ROCEPHIN) 1 g in sodium chloride 0.9 % 100 mL IVPB (0 g Intravenous Stopped 05/12/20 2213)  azithromycin (ZITHROMAX) 500 mg in sodium chloride 0.9 % 250 mL IVPB ( Intravenous Stopped 05/12/20 2316)    ED Course  I have reviewed the triage vital signs and the nursing notes.  Pertinent labs & imaging results that were available during my care of the patient were reviewed by me and considered in my medical decision making (see chart for details).    MDM Rules/Calculators/A&P                         Patient here for evaluation following a fall. Patient confused on examination. He is febrile, tachycardic and to. Concern for infection in a code sepsis  was initiated. Chest x-ray concerning for pneumonia and he was treated with antibiotics for possible community acquired pneumonia. Labs with hypokalemia, this was replace with IV potassium supplementation. He was treated with IV fluid hydration. No apparent trauma from his fall at home today. Discussed with patient findings of studies recommendation for admission and he is in agreement with treatment plan. Hospitalist consulted for admission.  Final Clinical Impression(s) / ED Diagnoses Final diagnoses:  Community acquired pneumonia of right lower lobe of lung  Hypokalemia  Sepsis  with acute hypoxic respiratory failure without septic shock, due to unspecified organism [facility])    Rx / [place] Orders ED Discharge Orders    None       Quintella Reichert, MD 05/12/20 2339

## 2020-05-12 NOTE — ED Triage Notes (Addendum)
Pt reports that he fell out of bed today and think that he is withdrawing from ETOH. LAst drink was Thursday. Family reports hallucinations and tremors. States that he hasnt been able to walk since wed. A&Ox4. CBG [place]

## 2020-05-12 NOTE — ED Notes (Signed)
CRITICAL VALUE STICKER  CRITICAL VALUE: K+ 2.6  RECEIVER (on-site recipient of call): Maylon Cos T RN  DATE & TIME NOTIFIED: 05/12/20 1042p  MESSENGER (representative from lab): Mardene Celeste  MD NOTIFIED: Ralene Bathe MD  TIME OF NOTIFICATION: 1043p  RESPONSE: see orders

## 2020-05-13 ENCOUNTER — Inpatient Hospital Stay (HOSPITAL_COMMUNITY): Payer: Self-pay

## 2020-05-13 ENCOUNTER — Encounter (HOSPITAL_COMMUNITY): Payer: Self-pay

## 2020-05-13 ENCOUNTER — Emergency Department (HOSPITAL_COMMUNITY): Payer: Self-pay

## 2020-05-13 DIAGNOSIS — F10931 Alcohol use, unspecified with withdrawal delirium: Secondary | ICD-10-CM | POA: Diagnosis present

## 2020-05-13 DIAGNOSIS — R6510 Systemic inflammatory response syndrome (SIRS) of non-infectious origin without acute organ dysfunction: Secondary | ICD-10-CM | POA: Diagnosis present

## 2020-05-13 DIAGNOSIS — F10231 Alcohol dependence with withdrawal delirium: Secondary | ICD-10-CM

## 2020-05-13 DIAGNOSIS — F1010 Alcohol abuse, uncomplicated: Secondary | ICD-10-CM

## 2020-05-13 DIAGNOSIS — A419 Sepsis, unspecified organism: Secondary | ICD-10-CM

## 2020-05-13 DIAGNOSIS — E871 Hypo-osmolality and hyponatremia: Secondary | ICD-10-CM | POA: Diagnosis present

## 2020-05-13 DIAGNOSIS — J189 Pneumonia, unspecified organism: Secondary | ICD-10-CM

## 2020-05-13 DIAGNOSIS — K7460 Unspecified cirrhosis of liver: Secondary | ICD-10-CM

## 2020-05-13 DIAGNOSIS — J9601 Acute respiratory failure with hypoxia: Secondary | ICD-10-CM | POA: Insufficient documentation

## 2020-05-13 DIAGNOSIS — R6520 Severe sepsis without septic shock: Secondary | ICD-10-CM

## 2020-05-13 DIAGNOSIS — E876 Hypokalemia: Secondary | ICD-10-CM

## 2020-05-13 LAB — URINALYSIS, ROUTINE W REFLEX MICROSCOPIC
Bilirubin Urine: NEGATIVE
Glucose, UA: >=500 mg/dL — AB
Ketones, ur: 5 mg/dL — AB
Leukocytes,Ua: NEGATIVE
Nitrite: NEGATIVE
Protein, ur: 100 mg/dL — AB
Specific Gravity, Urine: >1.046 — ABNORMAL HIGH (ref 1.005–1.030)
pH: 6 (ref 5.0–8.0)

## 2020-05-13 LAB — COMPREHENSIVE METABOLIC PANEL
ALT: 118 U/L — ABNORMAL HIGH (ref 0–44)
AST: 339 U/L — ABNORMAL HIGH (ref 15–41)
Albumin: 2.5 g/dL — ABNORMAL LOW (ref 3.5–5.0)
Alkaline Phosphatase: 61 U/L (ref 38–126)
Anion gap: 11 (ref 5–15)
BUN: 14 mg/dL (ref 6–20)
CO2: 25 mmol/L (ref 22–32)
Calcium: 7.6 mg/dL — ABNORMAL LOW (ref 8.9–10.3)
Chloride: 87 mmol/L — ABNORMAL LOW (ref 98–111)
Creatinine, Ser: 0.43 mg/dL — ABNORMAL LOW (ref 0.61–1.24)
GFR calc Af Amer: >60 mL/min (ref >60)
GFR calc non Af Amer: >60 mL/min (ref >60)
Glucose, Bld: 124 mg/dL — ABNORMAL HIGH (ref 70–99)
Potassium: 2.6 mmol/L — CL (ref 3.5–5.1)
Sodium: 123 mmol/L — ABNORMAL LOW (ref 135–145)
Total Bilirubin: 1.8 mg/dL — ABNORMAL HIGH (ref 0.3–1.2)
Total Protein: 6.1 g/dL — ABNORMAL LOW (ref 6.5–8.1)

## 2020-05-13 LAB — CBC
HCT: 32.4 % — ABNORMAL LOW (ref 39.0–52.0)
Hemoglobin: 11.6 g/dL — ABNORMAL LOW (ref 13.0–17.0)
MCH: 31.7 pg (ref 26.0–34.0)
MCHC: 35.8 g/dL (ref 30.0–36.0)
MCV: 88.5 fL (ref 80.0–100.0)
Platelets: 144 10*3/uL — ABNORMAL LOW (ref 150–400)
RBC: 3.66 MIL/uL — ABNORMAL LOW (ref 4.22–5.81)
RDW: 12.4 % (ref 11.5–15.5)
WBC: 9.7 10*3/uL (ref 4.0–10.5)
nRBC: 0 % (ref 0.0–0.2)

## 2020-05-13 LAB — HIV ANTIBODY (ROUTINE TESTING W REFLEX): HIV Screen 4th Generation wRfx: NONREACTIVE

## 2020-05-13 LAB — PHOSPHORUS: Phosphorus: 1.3 mg/dL — ABNORMAL LOW (ref 2.5–4.6)

## 2020-05-13 LAB — PROTIME-INR
INR: 1.4 — ABNORMAL HIGH (ref 0.8–1.2)
Prothrombin Time: 16.3 seconds — ABNORMAL HIGH (ref 11.4–15.2)

## 2020-05-13 LAB — MAGNESIUM: Magnesium: 2.3 mg/dL (ref 1.7–2.4)

## 2020-05-13 LAB — HEMOGLOBIN A1C
Hgb A1c MFr Bld: 6.1 % — ABNORMAL HIGH (ref 4.8–5.6)
Mean Plasma Glucose: 128.37 mg/dL

## 2020-05-13 LAB — CBG MONITORING, ED
Glucose-Capillary: 119 mg/dL — ABNORMAL HIGH (ref 70–99)
Glucose-Capillary: 108 mg/dL — ABNORMAL HIGH (ref 70–99)
Glucose-Capillary: 107 mg/dL — ABNORMAL HIGH (ref 70–99)

## 2020-05-13 LAB — SARS CORONAVIRUS 2 BY RT PCR (HOSPITAL ORDER, PERFORMED IN ~~LOC~~ HOSPITAL LAB): SARS Coronavirus 2: NEGATIVE

## 2020-05-13 MED ORDER — LORAZEPAM 2 MG/ML IJ SOLN
1.0000 mg | INTRAMUSCULAR | Status: AC | PRN
  Administered 2020-05-13: 1 mg via INTRAVENOUS
  Filled 2020-05-13 (×2): qty 1

## 2020-05-13 MED ORDER — LOSARTAN POTASSIUM 50 MG PO TABS
100.0000 mg | ORAL_TABLET | Freq: Every day | ORAL | Status: DC
  Administered 2020-05-13 – 2020-05-21 (×9): 100 mg via ORAL
  Filled 2020-05-13 (×9): qty 2

## 2020-05-13 MED ORDER — POTASSIUM CHLORIDE IN NACL 20-0.45 MEQ/L-% IV SOLN
INTRAVENOUS | Status: DC
  Filled 2020-05-13 (×3): qty 1000

## 2020-05-13 MED ORDER — ATORVASTATIN CALCIUM 40 MG PO TABS
40.0000 mg | ORAL_TABLET | Freq: Every day | ORAL | Status: DC
  Administered 2020-05-13 – 2020-05-21 (×9): 40 mg via ORAL
  Filled 2020-05-13 (×9): qty 1

## 2020-05-13 MED ORDER — FOLIC ACID 1 MG PO TABS: 1.0000 mg | ORAL_TABLET | Freq: Every day | ORAL | Status: DC

## 2020-05-13 MED ORDER — PANTOPRAZOLE SODIUM 40 MG IV SOLR
40.0000 mg | Freq: Two times a day (BID) | INTRAVENOUS | Status: DC
  Administered 2020-05-13 – 2020-05-16 (×8): 40 mg via INTRAVENOUS
  Filled 2020-05-13 (×8): qty 40

## 2020-05-13 MED ORDER — SODIUM CHLORIDE 0.9 % IV SOLN
3.0000 g | Freq: Four times a day (QID) | INTRAVENOUS | Status: DC
  Administered 2020-05-13 – 2020-05-16 (×13): 3 g via INTRAVENOUS
  Filled 2020-05-13: qty 3
  Filled 2020-05-13 (×6): qty 8
  Filled 2020-05-13 (×3): qty 3
  Filled 2020-05-13: qty 8
  Filled 2020-05-13 (×2): qty 3
  Filled 2020-05-13: qty 8
  Filled 2020-05-13: qty 3

## 2020-05-13 MED ORDER — LORAZEPAM 1 MG PO TABS: 1.0000 mg | ORAL_TABLET | ORAL | Status: AC | PRN

## 2020-05-13 MED ORDER — IOHEXOL 300 MG/ML  SOLN
100.0000 mL | Freq: Once | INTRAMUSCULAR | Status: AC | PRN
  Administered 2020-05-13: 100 mL via INTRAVENOUS

## 2020-05-13 MED ORDER — LORATADINE 10 MG PO TABS
10.0000 mg | ORAL_TABLET | Freq: Every day | ORAL | Status: DC
  Administered 2020-05-13 – 2020-05-21 (×9): 10 mg via ORAL
  Filled 2020-05-13 (×9): qty 1

## 2020-05-13 MED ORDER — POTASSIUM & SODIUM PHOSPHATES 280-160-250 MG PO PACK
1.0000 | PACK | Freq: Three times a day (TID) | ORAL | Status: AC
  Administered 2020-05-13 – 2020-05-15 (×7): 1 via ORAL
  Filled 2020-05-13 (×9): qty 1

## 2020-05-13 MED ORDER — FOLIC ACID 1 MG PO TABS
1.0000 mg | ORAL_TABLET | Freq: Every day | ORAL | Status: DC
  Administered 2020-05-13 – 2020-05-21 (×9): 1 mg via ORAL
  Filled 2020-05-13 (×9): qty 1

## 2020-05-13 MED ORDER — SODIUM CHLORIDE 0.9 % IV SOLN
500.0000 mg | INTRAVENOUS | Status: AC
  Administered 2020-05-13 – 2020-05-16 (×4): 500 mg via INTRAVENOUS
  Filled 2020-05-13 (×4): qty 500

## 2020-05-13 MED ORDER — POTASSIUM CHLORIDE CRYS ER 20 MEQ PO TBCR
40.0000 meq | EXTENDED_RELEASE_TABLET | ORAL | Status: AC
  Administered 2020-05-13 (×2): 40 meq via ORAL
  Filled 2020-05-13 (×2): qty 2

## 2020-05-13 MED ORDER — THIAMINE HCL 100 MG PO TABS: 100.0000 mg | ORAL_TABLET | Freq: Every day | ORAL | Status: DC

## 2020-05-13 MED ORDER — ACETAMINOPHEN 325 MG PO TABS
650.0000 mg | ORAL_TABLET | Freq: Four times a day (QID) | ORAL | Status: DC | PRN
  Administered 2020-05-13: 650 mg via ORAL
  Filled 2020-05-13: qty 2

## 2020-05-13 MED ORDER — INSULIN ASPART 100 UNIT/ML ~~LOC~~ SOLN
0.0000 [IU] | Freq: Three times a day (TID) | SUBCUTANEOUS | Status: DC
  Administered 2020-05-14: 2 [IU] via SUBCUTANEOUS
  Administered 2020-05-14: 3 [IU] via SUBCUTANEOUS
  Administered 2020-05-15 – 2020-05-17 (×4): 2 [IU] via SUBCUTANEOUS
  Administered 2020-05-17: 5 [IU] via SUBCUTANEOUS
  Administered 2020-05-17 – 2020-05-18 (×4): 3 [IU] via SUBCUTANEOUS
  Administered 2020-05-19: 5 [IU] via SUBCUTANEOUS
  Administered 2020-05-19: 3 [IU] via SUBCUTANEOUS
  Administered 2020-05-19 – 2020-05-20 (×3): 5 [IU] via SUBCUTANEOUS
  Administered 2020-05-20: 3 [IU] via SUBCUTANEOUS
  Administered 2020-05-21: 5 [IU] via SUBCUTANEOUS
  Filled 2020-05-13: qty 0.15

## 2020-05-13 MED ORDER — ONDANSETRON HCL 4 MG/2ML IJ SOLN: 4.0000 mg | Freq: Four times a day (QID) | INTRAMUSCULAR | Status: DC | PRN

## 2020-05-13 MED ORDER — ACETAMINOPHEN 650 MG RE SUPP: 650.0000 mg | Freq: Four times a day (QID) | RECTAL | Status: DC | PRN

## 2020-05-13 MED ORDER — ADULT MULTIVITAMIN W/MINERALS CH: 1.0000 | ORAL_TABLET | Freq: Every day | ORAL | Status: DC

## 2020-05-13 MED ORDER — PIPERACILLIN-TAZOBACTAM 3.375 G IVPB 30 MIN: 3.3750 g | Freq: Once | INTRAVENOUS | Status: DC

## 2020-05-13 MED ORDER — DULOXETINE HCL 30 MG PO CPEP
30.0000 mg | ORAL_CAPSULE | Freq: Every day | ORAL | Status: DC
  Administered 2020-05-13 – 2020-05-21 (×9): 30 mg via ORAL
  Filled 2020-05-13 (×9): qty 1

## 2020-05-13 MED ORDER — SENNOSIDES-DOCUSATE SODIUM 8.6-50 MG PO TABS: 1.0000 | ORAL_TABLET | Freq: Every evening | ORAL | Status: DC | PRN

## 2020-05-13 MED ORDER — ADULT MULTIVITAMIN W/MINERALS CH
1.0000 | ORAL_TABLET | Freq: Every day | ORAL | Status: DC
  Administered 2020-05-13 – 2020-05-21 (×9): 1 via ORAL
  Filled 2020-05-13 (×9): qty 1

## 2020-05-13 MED ORDER — THIAMINE HCL 100 MG PO TABS
100.0000 mg | ORAL_TABLET | Freq: Every day | ORAL | Status: DC
  Administered 2020-05-14 – 2020-05-20 (×7): 100 mg via ORAL
  Filled 2020-05-13 (×8): qty 1

## 2020-05-13 MED ORDER — ASPIRIN EC 81 MG PO TBEC
81.0000 mg | DELAYED_RELEASE_TABLET | Freq: Every day | ORAL | Status: DC
  Administered 2020-05-13 – 2020-05-21 (×9): 81 mg via ORAL
  Filled 2020-05-13 (×9): qty 1

## 2020-05-13 MED ORDER — AMLODIPINE BESYLATE 10 MG PO TABS
10.0000 mg | ORAL_TABLET | Freq: Every day | ORAL | Status: DC
  Administered 2020-05-13 – 2020-05-21 (×9): 10 mg via ORAL
  Filled 2020-05-13: qty 1
  Filled 2020-05-13: qty 2
  Filled 2020-05-13 (×3): qty 1
  Filled 2020-05-13: qty 2
  Filled 2020-05-13 (×3): qty 1

## 2020-05-13 MED ORDER — ONDANSETRON HCL 4 MG PO TABS: 4.0000 mg | ORAL_TABLET | Freq: Four times a day (QID) | ORAL | Status: DC | PRN

## 2020-05-13 MED ORDER — THIAMINE HCL 100 MG/ML IJ SOLN
100.0000 mg | Freq: Every day | INTRAMUSCULAR | Status: DC
  Administered 2020-05-13 – 2020-05-21 (×2): 100 mg via INTRAVENOUS
  Filled 2020-05-13 (×3): qty 2

## 2020-05-13 MED ORDER — HEPARIN SODIUM (PORCINE) 5000 UNIT/ML IJ SOLN
5000.0000 [IU] | Freq: Three times a day (TID) | INTRAMUSCULAR | Status: DC
  Administered 2020-05-13 – 2020-05-15 (×8): 5000 [IU] via SUBCUTANEOUS
  Filled 2020-05-13 (×7): qty 1

## 2020-05-13 NOTE — Progress Notes (Addendum)
Patient ID: RUBIN DAIS, male   DOB: [birth year], 59 y.o.   MRN: [id number] Patient admitted early this morning for altered mental status and fall secondary to probable pneumonia with hyponatremia with?  DTs and alcoholic hepatitis.  Patient seen and examined at bedside.  I have reviewed patient's medical records including this morning's H&P, current vitals, labs and medications myself.  Continue current antibiotics and IV fluids.  Replace potassium and phosphorus.  LFTs still elevated.  Will check right upper quadrant ultrasound repeat a.m. labs.  Fall precautions.

## 2020-05-13 NOTE — ED Notes (Signed)
Pt mother at bedside, updated on pt status and POC. Reinforced multiple times to pt mother inability to eat at this time, due to risk of aspiration. Pt unable to stay awake long enough to eat. MD aware as well.

## 2020-05-13 NOTE — H&P (Signed)
History and Physical    [name] [name] NTZ:[id number] DOB: 07/06/1961 DOA: 05/12/2020  PCP: [name], NP   Patient coming from: Home  I have personally briefly reviewed patient's old medical records in [place]  Chief Complaint: Altered mental status and fall  HPI: [name] [name] is a 59 y.o. 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  Patient confused and unable to provide reliable medical history.  However, he reports diarrhea with nausea vomiting and generalized abdominal pain.  Per EMS mother was concerned for alcohol withdrawal.  Reported last drink on Thursday.  Lives with mother.  Active smoker 1 pack/day for past 37 years.  Reports drinking 1-2   22 ounce Busch light daily.  Denies any recreational drug use.  ED Course: BP 133/84, pulse 107-135, RR 20-40, SPO2 92-96% room air T-max 103  Labs with WBC 12 K, platelets 147 Sodium 124, potassium 2.6, creatinine 0.48 Albumin 2.8, AST 376, ALT 129, total bili 2.2 EtOH less than 10, ammonia level 50 Lactic acid 1.5  -CXR with right hilar opacity/fullness.  Cannot rule out malignancy. -Head CT with moderate to severe left maxillary sinusitis.  Otherwise unremarkable. -EKG with sinus tach at 115 bpm, normal axis without significant ST-T wave changes.  Covid negative.  ED meds: -Ceftriaxone/azithromycin -Lactated Ringer x1 L -IV potassium replacement  Review of Systems: As per HPI otherwise 10 point review of systems negative.  Review of Systems  Constitutional: Positive for chills, diaphoresis, fever and malaise/fatigue. Negative for weight loss.  HENT: Negative.   Eyes: Negative.   Respiratory: Positive for cough, sputum production and shortness of breath. Negative for hemoptysis and wheezing.   Cardiovascular: Negative.   Gastrointestinal: Positive for abdominal pain,  diarrhea, nausea and vomiting. Negative for blood in stool, constipation, heartburn and melena.  Genitourinary: Negative.   Musculoskeletal: Positive for falls. Negative for back pain, joint pain, myalgias and neck pain.  Skin: Negative.   Neurological: Positive for tremors. Negative for dizziness, sensory change, speech change, focal weakness, seizures, loss of consciousness, weakness and headaches.  Endo/Heme/Allergies: Positive for environmental allergies. Does not bruise/bleed easily.  Psychiatric/Behavioral: Negative.     Past Medical History:  Diagnosis Date  . Barrett's esophagus dx 2016  . Bronchitis   . Depression   . Diabetes ([place])   . ED (erectile dysfunction)   . GERD (gastroesophageal reflux disease)   . Hepatitis C   . Hiatal hernia 10/2014   3cm  . Neuropathy     Past Surgical History:  Procedure Laterality Date  . APPENDECTOMY    . COLONOSCOPY N/A 02/16/2014   Procedure: COLONOSCOPY;  Surgeon: Gatha Mayer, MD;  Location: [facility];  Service: Endoscopy;  Laterality: N/A;  . COLONOSCOPY    . TONSILLECTOMY       reports that he has been smoking cigarettes. He has a 35.00 pack-year smoking history. He has never used smokeless tobacco. He reports current alcohol use of about 27.0 standard drinks of alcohol per week. He reports that he does not use drugs.  Allergies  Allergen Reactions  . Bee Venom Anaphylaxis  . Lactose Intolerance (Gi) Other (See Comments)    GI upset    Family History  Problem Relation Age of Onset  . Cancer Mother        breast  . Diabetes Mother   . Heart disease Maternal Grandfather   .  Pancreatic cancer Paternal Grandmother   . Stroke Father   . Alcohol abuse Father   . Pancreatic cancer Father   . Colon cancer Neg Hx   . Stomach cancer Neg Hx   . Rectal cancer Neg Hx   . Esophageal cancer Neg Hx     Prior to Admission medications   Medication Sig Start Date End Date Taking? Authorizing Provider  albuterol (VENTOLIN  HFA) 108 (90 Base) MCG/ACT inhaler Inhale 2 puffs into the lungs every 6 (six) hours as needed for wheezing or shortness of breath. 06/16/19  Yes [name], NP  amLODipine (NORVASC) 10 MG tablet Take 1 tablet (10 mg total) by mouth daily. 12/19/19 05/12/20 Yes [name], NP  amoxicillin-clavulanate (AUGMENTIN) 875-125 MG tablet Take 1 tablet by mouth every 12 (twelve) hours. 05/05/20  Yes Rancour, Annie Main, MD  atorvastatin (LIPITOR) 40 MG tablet Take 1 tablet (40 mg total) by mouth daily. 12/19/19  Yes [name], NP  canagliflozin (INVOKANA) 300 MG TABS tablet Take 1 tablet (300 mg total) by mouth daily before breakfast. 09/18/19  Yes [name], NP  chlordiazePOXIDE (LIBRIUM) 25 MG capsule 50mg  PO TID x 2D, then 25-50mg  PO BID X 2D, then 25-50mg  PO QD X 1D 04/08/20  Yes Mesner, Corene Cornea, MD  doxycycline (VIBRAMYCIN) 100 MG capsule Take 1 capsule (100 mg total) by mouth 2 (two) times daily. 05/05/20  Yes Rancour, Annie Main, MD  DULoxetine (CYMBALTA) 30 MG capsule TAKE 1 CAPSULE (30 MG TOTAL) BY MOUTH DAILY. 12/22/19  Yes Newlin, Enobong, MD  glipiZIDE (GLUCOTROL) 5 MG tablet TAKE 1 TABLET (5 MG TOTAL) BY MOUTH DAILY BEFORE BREAKFAST. 04/17/20 07/16/20 Yes [name], NP  losartan (COZAAR) 100 MG tablet Take 1 tablet (100 mg total) by mouth daily. 12/19/19  Yes [name], NP  mirtazapine (REMERON) 15 MG tablet Take 1 tablet (15 mg total) by mouth at bedtime. 05/01/20  Yes Mayers, Cari S, PA-C  mupirocin ointment (BACTROBAN) 2 % Apply to affected areas twice per day. Patient will pick up scripts today. 03/14/19  Yes [name], NP  omeprazole (PRILOSEC) 40 MG capsule Take 1 capsule (40 mg total) by mouth daily. 12/19/19 05/12/20 Yes [name], NP  aspirin EC 81 MG tablet Take 1 tablet (81 mg total) by mouth daily. 04/02/17   Funches, Adriana Mccallum, MD  cephALEXin (KEFLEX) 500 MG capsule Take 1 capsule (500 mg total) by mouth 4 (four) times daily. 04/17/20   Mayers, Cari S, PA-C   loperamide (IMODIUM A-D) 2 MG tablet Take 1 tablet (2 mg total) by mouth 4 (four) times daily as needed for diarrhea or loose stools. 03/01/19   [name], NP  Multiple Vitamins-Minerals (MULTIVITAMIN ADULT) TABS Take 1 tablet by mouth daily. 04/02/17   Funches, Adriana Mccallum, MD  ondansetron (ZOFRAN) 4 MG tablet Take 1 tablet (4 mg total) by mouth every 8 (eight) hours as needed for nausea or vomiting. 04/08/20   Mesner, Corene Cornea, MD  triamcinolone (KENALOG) 0.025 % cream APPLY 1 APPLICATION TOPICALLY 2 (TWO) TIMES DAILY. Patient not taking: Reported on 05/12/2020 09/14/19   Charlott Rakes, MD    Physical Exam: Vitals:   05/12/20 2300 05/12/20 2315 05/12/20 2330 05/13/20 0021  BP: 135/84 135/82 135/80 135/83  Pulse:   (!) 107 (!) 103  Resp: (!) 39 (!) 40 (!) 35 (!) 34  Temp:      TempSrc:      SpO2:   93% 92%  Weight:  Height:        Constitutional: Confused not oriented.  Vitals:   05/12/20 2300 05/12/20 2315 05/12/20 2330 05/13/20 0021  BP: 135/84 135/82 135/80 135/83  Pulse:   (!) 107 (!) 103  Resp: (!) 39 (!) 40 (!) 35 (!) 34  Temp:      TempSrc:      SpO2:   93% 92%  Weight:      Height:       Eyes: PERRL, lids and conjunctivae normal ENMT: Mucous membranes are moist. Posterior pharynx clear of any exudate or lesions.Normal dentition.  Neck: normal, supple, no masses, no thyromegaly no JVD Respiratory: Some wheezing present.  Cardiovascular: Tachycardic but regular rate and rhythm.  No murmur rubs or gallops.  No lower extremity edema.  No carotid bruits.  Abdomen: Hyperactive bowel sounds, abdominal tenderness diffuse without guarding or rebound. No CVA tenderness. Musculoskeletal: no clubbing / cyanosis. No joint deformity upper and lower extremities. Good ROM, no contractures. Normal muscle tone.  Skin: no rashes, lesions, ulcers. No induration Neurologic: Patient confused as above   psychiatric: Normal judgment and insight.  Confused as above     Labs on  Admission: I have personally reviewed following labs and imaging studies  CBC: Recent Labs  Lab 05/12/20 2120  WBC 11.7*  NEUTROABS 10.5*  HGB 12.5*  HCT 35.3*  MCV 88.9  PLT 093*   Basic Metabolic Panel: Recent Labs  Lab 05/12/20 2120  NA 124*  K 2.6*  CL 86*  CO2 25  GLUCOSE 163*  BUN 18  CREATININE 0.48*  CALCIUM 8.3*  MG 2.5*   GFR: Estimated Creatinine Clearance: 108.5 mL/min (A) (by C-G formula based on SCr of 0.48 mg/dL (L)). Liver Function Tests: Recent Labs  Lab 05/12/20 2120  AST 376*  ALT 129*  ALKPHOS 71  BILITOT 2.2*  PROT 6.9  ALBUMIN 2.8*   No results for input(s): LIPASE, AMYLASE in the last 168 hours. Recent Labs  Lab 05/12/20 2120  AMMONIA 50*   Coagulation Profile: Recent Labs  Lab 05/12/20 2120  INR 1.3*   Cardiac Enzymes: No results for input(s): CKTOTAL, CKMB, CKMBINDEX, TROPONINI in the last 168 hours. BNP (last 3 results) No results for input(s): PROBNP in the last 8760 hours. HbA1C: No results for input(s): HGBA1C in the last 72 hours. CBG: Recent Labs  Lab 05/12/20 1854 05/12/20 2114  GLUCAP 275* 148*   Lipid Profile: No results for input(s): CHOL, HDL, LDLCALC, TRIG, CHOLHDL, LDLDIRECT in the last 72 hours. Thyroid Function Tests: No results for input(s): TSH, T4TOTAL, FREET4, T3FREE, THYROIDAB in the last 72 hours. Anemia Panel: No results for input(s): VITAMINB12, FOLATE, FERRITIN, TIBC, IRON, RETICCTPCT in the last 72 hours. Urine analysis:    Component Value Date/Time   COLORURINE YELLOW 09/28/2013 1520   APPEARANCEUR CLEAR 09/28/2013 1520   LABSPEC >1.030 (H) 09/28/2013 1520   PHURINE 5.5 09/28/2013 1520   GLUCOSEU > 1000 (A) 09/28/2013 1520   HGBUR NEG 09/28/2013 1520   BILIRUBINUR neg 08/03/2017 0959   KETONESUR TRACE (A) 09/28/2013 1520   PROTEINUR neg 08/03/2017 0959   PROTEINUR NEG 09/28/2013 1520   UROBILINOGEN 0.2 08/03/2017 0959   UROBILINOGEN 0.2 09/28/2013 1520   NITRITE neg 08/03/2017 0959    NITRITE NEG 09/28/2013 1520   LEUKOCYTESUR Negative 08/03/2017 0959    Radiological Exams on Admission: CT Head Wo Contrast  Result Date: 05/12/2020 CLINICAL DATA:  Altered mental status and subsequent fall. EXAM: CT HEAD WITHOUT CONTRAST TECHNIQUE: Contiguous axial  images were obtained from the base of the skull through the vertex without intravenous contrast. COMPARISON:  May 05, 2020 FINDINGS: Brain: There is mild cerebral atrophy with widening of the extra-axial spaces and ventricular dilatation. There are areas of decreased attenuation within the white matter tracts of the supratentorial brain, consistent with microvascular disease changes. Vascular: No hyperdense vessel or unexpected calcification. Skull: Normal. Negative for fracture or focal lesion. Sinuses/Orbits: There is moderate to marked severity left maxillary sinus mucosal thickening. Other: None. IMPRESSION: 1. Generalized cerebral atrophy. 2. No acute intracranial abnormality. 3. Moderate to marked severity left maxillary sinus disease. Electronically Signed   By: Virgina Norfolk M.D.   On: 05/12/2020 21:36   DG Chest Port 1 View  Result Date: 05/12/2020 CLINICAL DATA:  Sepsis. EXAM: PORTABLE CHEST 1 VIEW COMPARISON:  March 14, 2019 FINDINGS: There is an ill-defined, somewhat hazy right hilar/infrahilar airspace opacity. There is fullness of the right hilum. The left lung field is essentially clear. The heart size is normal. Aortic calcifications are noted. There are advanced degenerative changes of the right glenohumeral joint. IMPRESSION: Right hilar/infrahilar airspace opacity concerning for pneumonia. There is some fullness of the right hilum which may be secondary to adenopathy. Follow-up to radiologic resolution is recommended. Electronically Signed   By: Constance Holster M.D.   On: 05/12/2020 19:39    EKG: Independently reviewed.  Sinus tachycardia at 115 bpm, normal axis, no ST-T wave changes.  Intervals within  normal limits.  Assessment/Plan Active Problems:   Hepatic cirrhosis (H[facility])   Type 2 diabetes mellitus with hyperlipidemia ([facility])   ETOH abuse   Essential hypertension   DTs (delirium tremens) ([facility])   Hyponatremia   Hypokalemia   PNA (pneumonia)     #Hyponatremia 124 and hypokalemia 2.6 #Suspected right middle lobe pneumonia question aspiration -Unasyn 3 g every 6 with azithromycin. -Aspiration precautions  #SIRS/confusion consistent with DTs #Alcoholic hepatitis  #Abdominal pain -CT abdomen pending -PPI twice daily  #Maxillary sinusitis -Claritin and antibiotics as above-  #HTN -Continue with losartan and Norvasc.  #DM -Continue with ASA/Lipitor.  Hold Invokana and glipizide.  Will cover with sliding scale coverage.  #Depression Continue with Cymbalta.  DVT prophylaxis: Subcu heparin   code Status: Full code Family Communication: Unavailable at this time.  Disposition Plan: Home versus rehab.   Consults called: None Admission status: Inpatient to stepdown.   Florena Kozma MD [facility]   If 7PM-7AM, please contact night-coverage [email or web address] Password [facility]  05/13/2020, 12:45 AM

## 2020-05-13 NOTE — ED Notes (Signed)
Ophelia Shoulder, mother, would like an update, [phone]

## 2020-05-13 NOTE — ED Notes (Signed)
Pt able to verbalize name and birthday, able to tolerate PO water and medications, falls asleep quickly after

## 2020-05-13 NOTE — ED Notes (Signed)
Attempted to call pt mother to give update. No answer.

## 2020-05-14 DIAGNOSIS — E871 Hypo-osmolality and hyponatremia: Secondary | ICD-10-CM

## 2020-05-14 DIAGNOSIS — E785 Hyperlipidemia, unspecified: Secondary | ICD-10-CM

## 2020-05-14 DIAGNOSIS — E1169 Type 2 diabetes mellitus with other specified complication: Secondary | ICD-10-CM

## 2020-05-14 DIAGNOSIS — J189 Pneumonia, unspecified organism: Secondary | ICD-10-CM

## 2020-05-14 HISTORY — DX: Pneumonia, unspecified organism: J18.9

## 2020-05-14 LAB — COMPREHENSIVE METABOLIC PANEL
ALT: 104 U/L — ABNORMAL HIGH (ref 0–44)
AST: 269 U/L — ABNORMAL HIGH (ref 15–41)
Albumin: 2.3 g/dL — ABNORMAL LOW (ref 3.5–5.0)
Alkaline Phosphatase: 67 U/L (ref 38–126)
Anion gap: 8 (ref 5–15)
BUN: 13 mg/dL (ref 6–20)
CO2: 25 mmol/L (ref 22–32)
Calcium: 7.5 mg/dL — ABNORMAL LOW (ref 8.9–10.3)
Chloride: 93 mmol/L — ABNORMAL LOW (ref 98–111)
Creatinine, Ser: 0.37 mg/dL — ABNORMAL LOW (ref 0.61–1.24)
GFR calc Af Amer: >60 mL/min (ref >60)
GFR calc non Af Amer: >60 mL/min (ref >60)
Glucose, Bld: 112 mg/dL — ABNORMAL HIGH (ref 70–99)
Potassium: 3.2 mmol/L — ABNORMAL LOW (ref 3.5–5.1)
Sodium: 126 mmol/L — ABNORMAL LOW (ref 135–145)
Total Bilirubin: 1.7 mg/dL — ABNORMAL HIGH (ref 0.3–1.2)
Total Protein: 5.9 g/dL — ABNORMAL LOW (ref 6.5–8.1)

## 2020-05-14 LAB — MAGNESIUM: Magnesium: 2.3 mg/dL (ref 1.7–2.4)

## 2020-05-14 LAB — PROTIME-INR
INR: 1.3 — ABNORMAL HIGH (ref 0.8–1.2)
Prothrombin Time: 15.3 seconds — ABNORMAL HIGH (ref 11.4–15.2)

## 2020-05-14 LAB — CBC
HCT: 32.8 % — ABNORMAL LOW (ref 39.0–52.0)
Hemoglobin: 11.2 g/dL — ABNORMAL LOW (ref 13.0–17.0)
MCH: 31.3 pg (ref 26.0–34.0)
MCHC: 34.1 g/dL (ref 30.0–36.0)
MCV: 91.6 fL (ref 80.0–100.0)
Platelets: 189 10*3/uL (ref 150–400)
RBC: 3.58 MIL/uL — ABNORMAL LOW (ref 4.22–5.81)
RDW: 13.2 % (ref 11.5–15.5)
WBC: 8.3 10*3/uL (ref 4.0–10.5)
nRBC: 0 % (ref 0.0–0.2)

## 2020-05-14 LAB — CBG MONITORING, ED
Glucose-Capillary: 102 mg/dL — ABNORMAL HIGH (ref 70–99)
Glucose-Capillary: 156 mg/dL — ABNORMAL HIGH (ref 70–99)

## 2020-05-14 LAB — GLUCOSE, CAPILLARY
Glucose-Capillary: 147 mg/dL — ABNORMAL HIGH (ref 70–99)
Glucose-Capillary: 121 mg/dL — ABNORMAL HIGH (ref 70–99)

## 2020-05-14 MED ORDER — LACTULOSE 10 GM/15ML PO SOLN
10.0000 g | Freq: Two times a day (BID) | ORAL | Status: DC
  Administered 2020-05-14 – 2020-05-16 (×6): 10 g via ORAL
  Filled 2020-05-14 (×2): qty 15
  Filled 2020-05-14: qty 30
  Filled 2020-05-14 (×3): qty 15

## 2020-05-14 MED ORDER — POTASSIUM CHLORIDE CRYS ER 20 MEQ PO TBCR
40.0000 meq | EXTENDED_RELEASE_TABLET | Freq: Once | ORAL | Status: AC
  Administered 2020-05-14: 40 meq via ORAL
  Filled 2020-05-14: qty 2

## 2020-05-14 MED ORDER — POTASSIUM CHLORIDE IN NACL 40-0.9 MEQ/L-% IV SOLN
INTRAVENOUS | Status: DC
  Filled 2020-05-14 (×5): qty 1000

## 2020-05-14 NOTE — ED Notes (Signed)
PT at bedside.

## 2020-05-14 NOTE — Progress Notes (Signed)
Received report from [place], Therapist, sports.  Awaiting patient to arrive to Room 1607.

## 2020-05-14 NOTE — Progress Notes (Signed)
Patient ID: [name] [name], male   DOB: 01-15-1961, 59 y.o.   MRN: [id number]  PROGRESS NOTE    [name] [name]  UVO:[id number] DOB: 03/09/1961 DOA: 05/12/2020 PCP: [name], NP   Brief Narrative:  59 year old male with history of hypertension, diabetes mellitus type 2, GERD/hiatal hernia, depression, COPD, alcohol abuse, hepatitis C status post treatment/?  Cirrhosis of liver presented with altered mental status and fall.  Patient was confused on presentation.  Per EMS, mother was concerned for alcohol withdrawal; reported last drink on Thursday.  On presentation, WBCs was 12, sodium of 124, potassium 3.6 with elevated AST/ALT/total bili with ammonia 50.  Chest x-ray showed right hilar opacity/fullness.  Head CT was showing moderate to severe left maxillary sinusitis.  Covid test was negative.  He was started on IV fluids and antibiotics.  Assessment & Plan:   Community-acquired right lower lobar pneumonia -Chest x-ray showed right hilar opacity/fullness.  CT of the abdomen showed right lower lobe infiltrate -Currently on Unasyn and Zithromax.  Blood cultures negative so far.  Covid testing negative on admission -Currently on room air -SLP evaluation  Acute metabolic encephalopathy versus acute toxic encephalopathy -Patient presented confused.  Could be secondary to alcohol dependence/withdrawal versus hepatic encephalopathy as ammonia was 50 versus pneumonia -CT of the head was negative for any acute intracranial abnormality.  Monitor mental status.  More awake but still very slow to respond.  Fall precautions. -Right upper quadrant ultrasound not show findings of cirrhosis - will start lactulose  Hyponatremia -Probably from poor oral intake.  Continue IV fluids.  Sodium 124 on presentation.  126 this morning.  Hypokalemia -Replace.  Repeat a.m. labs  Alcohol abuse/alcohol withdrawal -Continue CIWA protocol along with multivitamin/thiamine/folate -Social worker  consult  Elevated LFTs/probable alcoholic hepatitis Hepatic steatosis -Right upper quadrant ultrasound did not show findings of cirrhosis but showed hepatic steatosis.  LFTs improving.  Monitor LFTs  Maxillary sinusitis -Seen on CT of the head.  Continue Unasyn  Hypertension -Monitor blood pressure.  Continue losartan and amlodipine  Diabetes mellitus type 2 -A1c 6.1.  Continue CBGs with SSI Invokana and glipizide on hold  Depression -Continue duloxetine  Generalized deconditioning -PT eval   DVT prophylaxis: Subcutaneous heparin Code Status: Full Family Communication: None at bedside Disposition Plan: Status is: Inpatient  Remains inpatient appropriate because:Inpatient level of care appropriate due to severity of illness   Dispo: The patient is from: Home              Anticipated d/c is to: Home              Anticipated d/c date is: 2 days              Patient currently is not medically stable to d/c.    Consultants: None  Procedures: None  Antimicrobials: Unasyn and Zithromax from 05/12/2020 onwards   Subjective: Patient seen and examined at bedside.  Poor historian.  Denies overnight fever or vomiting.  Complains of some abdominal pain and cough with shortness of breath.  Objective: Vitals:   05/14/20 0630 05/14/20 0700 05/14/20 0718 05/14/20 0748  BP: 131/80 132/85 132/85 126/82  Pulse: 98 97 92 93  Resp: (!) 37 (!) 29 (!) 31 (!) 30  Temp:      TempSrc:      SpO2: 91% 93% 91% 93%  Weight:      Height:        Intake/Output Summary (Last 24 hours) at 05/14/2020 0813 Last data filed  at 05/13/2020 1419 Gross per 24 hour  Intake 100 ml  Output --  Net 100 ml   Filed Weights   05/12/20 1900  Weight: 81.2 kg    Examination:  General exam: Appears calm and comfortable.  Looks chronically ill.  Poor historian. Respiratory system: Bilateral decreased breath sounds at bases with some scattered crackles, tachypneic Cardiovascular system: S1 & S2  heard, Rate controlled Gastrointestinal system: Abdomen is nondistended, soft and nontender. Normal bowel sounds heard. Extremities: No cyanosis, clubbing, edema  Central nervous system: Poor historian.  Does not participate in conversation much.  No focal neurological deficits. Moving extremities Skin: No rashes, lesions or ulcers Psychiatry: Flat affect   Data Reviewed: I have personally reviewed following labs and imaging studies  CBC: Recent Labs  Lab 05/12/20 2120 05/13/20 0512 05/14/20 0420  WBC 11.7* 9.7 8.3  NEUTROABS 10.5*  --   --   HGB 12.5* 11.6* 11.2*  HCT 35.3* 32.4* 32.8*  MCV 88.9 88.5 91.6  PLT 147* 144* 948   Basic Metabolic Panel: Recent Labs  Lab 05/12/20 2120 05/13/20 0511 05/13/20 0512 05/14/20 0420  NA 124*  --  123* 126*  K 2.6*  --  2.6* 3.2*  CL 86*  --  87* 93*  CO2 25  --  25 25  GLUCOSE 163*  --  124* 112*  BUN 18  --  14 13  CREATININE 0.48*  --  0.43* 0.37*  CALCIUM 8.3*  --  7.6* 7.5*  MG 2.5* 2.3  --  2.3  PHOS  --  1.3*  --   --    GFR: Estimated Creatinine Clearance: 108.5 mL/min (A) (by C-G formula based on SCr of 0.37 mg/dL (L)). Liver Function Tests: Recent Labs  Lab 05/12/20 2120 05/13/20 0512 05/14/20 0420  AST 376* 339* 269*  ALT 129* 118* 104*  ALKPHOS 71 61 67  BILITOT 2.2* 1.8* 1.7*  PROT 6.9 6.1* 5.9*  ALBUMIN 2.8* 2.5* 2.3*   No results for input(s): LIPASE, AMYLASE in the last 168 hours. Recent Labs  Lab 05/12/20 2120  AMMONIA 50*   Coagulation Profile: Recent Labs  Lab 05/12/20 2120 05/13/20 0512 05/14/20 0420  INR 1.3* 1.4* 1.3*   Cardiac Enzymes: No results for input(s): CKTOTAL, CKMB, CKMBINDEX, TROPONINI in the last 168 hours. BNP (last 3 results) No results for input(s): PROBNP in the last 8760 hours. HbA1C: Recent Labs    05/13/20 0511  HGBA1C 6.1*   CBG: Recent Labs  Lab 05/12/20 2114 05/13/20 0756 05/13/20 1154 05/13/20 1645 05/14/20 0732  GLUCAP 148* 119* 108* 107* 102*    Lipid Profile: No results for input(s): CHOL, HDL, LDLCALC, TRIG, CHOLHDL, LDLDIRECT in the last 72 hours. Thyroid Function Tests: No results for input(s): TSH, T4TOTAL, FREET4, T3FREE, THYROIDAB in the last 72 hours. Anemia Panel: No results for input(s): VITAMINB12, FOLATE, FERRITIN, TIBC, IRON, RETICCTPCT in the last 72 hours. Sepsis Labs: Recent Labs  Lab 05/12/20 2120  LATICACIDVEN 1.5    Recent Results (from the past 240 hour(s))  Blood culture (routine single)     Status: None (Preliminary result)   Collection Time: 05/12/20  9:20 PM   Specimen: BLOOD LEFT FOREARM  Result Value Ref Range Status   Specimen Description   Final    BLOOD LEFT FOREARM Performed at [facility] [address], [place], [place] [address]    Special Requests   Final    BOTTLES DRAWN AEROBIC AND ANAEROBIC Blood Culture adequate volume Performed  at [facility], [place] [address], [place], [place] [address]    Culture   Final    NO GROWTH 1 DAY Performed at [facility], [place] [address], [place], [place] [address]    Report Status PENDING  Incomplete  SARS Coronavirus 2 by RT PCR (hospital order, performed in [facility] hospital lab) Nasopharyngeal Nasopharyngeal Swab     Status: None   Collection Time: 05/12/20  9:20 PM   Specimen: Nasopharyngeal Swab  Result Value Ref Range Status   SARS Coronavirus 2 NEGATIVE NEGATIVE Final    Comment: (NOTE) SARS-CoV-2 target nucleic acids are NOT DETECTED.  The SARS-CoV-2 RNA is generally detectable in upper and lower respiratory specimens during the acute phase of infection. The lowest concentration of SARS-CoV-2 viral copies this assay can detect is 250 copies / mL. A negative result does not preclude SARS-CoV-2 infection and should not be used as the sole basis for treatment or other patient management decisions.  A negative result may occur with improper specimen collection / handling, submission of  specimen other than nasopharyngeal swab, presence of viral mutation(s) within the areas targeted by this assay, and inadequate number of viral copies (<250 copies / mL). A negative result must be combined with clinical observations, patient history, and epidemiological information.  Fact Sheet for Patients:   StrictlyIdeas.no  Fact Sheet for Healthcare Providers: BankingDealers.co.za  This test is not yet approved or  cleared by the [place] FDA and has been authorized for detection and/or diagnosis of SARS-CoV-2 by FDA under an Emergency Use Authorization (EUA).  This EUA will remain in effect (meaning this test can be used) for the duration of the COVID-19 declaration under Section 564(b)(1) of the Act, 21 U.S.C. section 360bbb-3(b)(1), unless the authorization is terminated or revoked sooner.  Performed at [facility], [place] [address], [place], [place] [address]   Culture, blood (single)     Status: None (Preliminary result)   Collection Time: 05/12/20  9:20 PM   Specimen: BLOOD LEFT HAND  Result Value Ref Range Status   Specimen Description   Final    BLOOD LEFT HAND Performed at [facility] [address], [place], [place] [address]    Special Requests   Final    BOTTLES DRAWN AEROBIC AND ANAEROBIC Blood Culture adequate volume Performed at [facility] [address] [place], [place] [address]    Culture   Final    NO GROWTH 1 DAY Performed at [facility], [place] [address], [place],  [address]    Report Status PENDING  Incomplete         Radiology Studies: CT Head Wo Contrast  Result Date: 05/12/2020 CLINICAL DATA:  Altered mental status and subsequent fall. EXAM: CT HEAD WITHOUT CONTRAST TECHNIQUE: Contiguous axial images were obtained from the base of the skull through the vertex without intravenous contrast. COMPARISON:  May 05, 2020 FINDINGS: Brain: There is mild cerebral atrophy with widening of the extra-axial spaces and ventricular dilatation. There are areas of decreased attenuation within the white matter tracts of the supratentorial brain, consistent with microvascular disease changes. Vascular: No hyperdense vessel or unexpected calcification. Skull: Normal. Negative for fracture or focal lesion. Sinuses/Orbits: There is moderate to marked severity left maxillary sinus mucosal thickening. Other: None. IMPRESSION: 1. Generalized cerebral atrophy. 2. No acute intracranial abnormality. 3. Moderate to marked severity left maxillary sinus disease. Electronically Signed   By: Joyce Gross.D.  On: 05/12/2020 21:36   CT Abdomen Pelvis W Contrast  Result Date: 05/13/2020 CLINICAL DATA:  Abdominal pain and fever. EXAM: CT ABDOMEN AND PELVIS WITH CONTRAST TECHNIQUE: Multidetector CT imaging of the abdomen and pelvis was performed using the standard protocol following bolus administration of intravenous contrast. CONTRAST:  153mL OMNIPAQUE IOHEXOL 300 MG/ML  SOLN COMPARISON:  November 02, 2013 FINDINGS: Lower chest: Marked severity infiltrate is seen along the posterior aspect of the right lung base. Hepatobiliary: There is diffuse fatty infiltration of the liver parenchyma. No focal liver abnormality is seen. A punctate gallstone is seen within the gallbladder lumen (axial CT image 32, CT series number 2) without evidence of gallbladder wall thickening or biliary dilatation. Pancreas: Unremarkable. No pancreatic ductal dilatation or surrounding inflammatory changes. Spleen: Normal in size without focal abnormality. Adrenals/Urinary Tract: Adrenal glands are unremarkable. Kidneys are normal, without renal calculi, focal lesion, or hydronephrosis. Bladder is unremarkable. Stomach/Bowel: Stomach is within normal limits. The appendix is not clearly identified. No evidence of bowel wall thickening, distention, or inflammatory changes.  Noninflamed diverticula are seen within the descending and sigmoid colon. Vascular/Lymphatic: There is moderate to marked severity calcification of the abdominal aorta and bilateral common iliac arteries, without evidence of aneurysmal dilatation. No enlarged abdominal or pelvic lymph nodes. Reproductive: The prostate gland is mildly enlarged. Other: No abdominal wall hernia or abnormality. No abdominopelvic ascites. Musculoskeletal: No acute or significant osseous findings. IMPRESSION: 1. Marked severity right lower lobe infiltrate. 2. Cholelithiasis without evidence of acute cholecystitis. 3. Colonic diverticulosis. 4. Mildly enlarged prostate gland. 5. Aortic atherosclerosis. Aortic Atherosclerosis (ICD10-I70.0). Electronically Signed   By: Virgina Norfolk M.D.   On: 05/13/2020 01:27   DG Chest Port 1 View  Result Date: 05/12/2020 CLINICAL DATA:  Sepsis. EXAM: PORTABLE CHEST 1 VIEW COMPARISON:  March 14, 2019 FINDINGS: There is an ill-defined, somewhat hazy right hilar/infrahilar airspace opacity. There is fullness of the right hilum. The left lung field is essentially clear. The heart size is normal. Aortic calcifications are noted. There are advanced degenerative changes of the right glenohumeral joint. IMPRESSION: Right hilar/infrahilar airspace opacity concerning for pneumonia. There is some fullness of the right hilum which may be secondary to adenopathy. Follow-up to radiologic resolution is recommended. Electronically Signed   By: Constance Holster M.D.   On: 05/12/2020 19:39   US Abdomen Limited RUQ  Result Date: 05/13/2020 CLINICAL DATA:  Elevated liver function tests. EXAM: ULTRASOUND ABDOMEN LIMITED RIGHT UPPER QUADRANT COMPARISON:  CT abdomen and pelvis 05/13/2020 FINDINGS: Gallbladder: 10 mm gallstone. No gallbladder wall thickening. No sonographic Murphy sign noted by sonographer. Common bile duct: Diameter: 4 mm Liver: Prominently increased parenchymal echogenicity diffusely without a  focal lesion identified. Portal vein is patent on color Doppler imaging with normal direction of blood flow towards the liver. Other: Trace perihepatic fluid. IMPRESSION: 1. Cholelithiasis without evidence of cholecystitis. 2. Hepatic steatosis. 3. Trace perihepatic ascites. Electronically Signed   By: Logan Bores M.D.   On: 05/13/2020 13:02        Scheduled Meds: . amLODipine  10 mg Oral Daily  . aspirin EC  81 mg Oral Daily  . atorvastatin  40 mg Oral Daily  . DULoxetine  30 mg Oral Daily  . folic acid  1 mg Oral Daily  . heparin  5,000 Units Subcutaneous Q8H  . insulin aspart  0-15 Units Subcutaneous TID WC  . loratadine  10 mg Oral Daily  . losartan  100 mg Oral Daily  . multivitamin with  minerals  1 tablet Oral Daily  . pantoprazole (PROTONIX) IV  40 mg Intravenous Q12H  . potassium & sodium phosphates  1 packet Oral TID WC & HS  . thiamine  100 mg Oral Daily   Or  . thiamine  100 mg Intravenous Daily   Continuous Infusions: . ampicillin-sulbactam (UNASYN) IV 3 g (05/14/20 0515)  . azithromycin Stopped (05/13/20 2316)          Aline August, MD [facility] 05/14/2020, 8:13 AM

## 2020-05-14 NOTE — Evaluation (Signed)
Physical Therapy Evaluation Patient Details Name: [name] [name] MRN: [id number] DOB: February 04, 1961 Today's Date: 05/14/2020   History of Present Illness  59 year old male with history of hypertension, diabetes mellitus type 2, GERD/hiatal hernia, depression, COPD, alcohol abuse, hepatitis C, cirrhosis of liver. Pt presented with altered mental status and fall. Patient was confused on presentation.  Per EMS, mother was concerned for alcohol withdrawal.    Clinical Impression  [name] [name] is 59 y.o. male admitted with above HPI and diagnosis. Patient is currently limited by functional impairments below (see PT problem list). Patient lives with his mother and reports independence with use of RW for mobility at baseline; however pt unreliable historian. He currently required mod assist for trasnfers and gait and required mod assist with RW to prevent LOB. Patient will benefit from continued skilled PT interventions to address impairments and progress independence with mobility, recommending SNF with 24/7 assist. Acute PT will follow and progress as able.     Follow Up Recommendations SNF;Supervision/Assistance - 24 hour    Equipment Recommendations  Other (comment) (TBA)    Recommendations for Other Services OT consult     Precautions / Restrictions Precautions Precautions: Fall Restrictions Weight Bearing Restrictions: No      Mobility  Bed Mobility Overal bed mobility: Needs Assistance Bed Mobility: Supine to Sit;Sit to Supine     Supine to sit: Min assist;Mod assist;HOB elevated Sit to supine: Mod assist   General bed mobility comments: Repeated VC's for sequencing bed mobility. Mod assist to raise trunk up and bring LE's off EOB. Mod assist required to steady balance sitting EOB and to reuturn to supine at EOS.  Transfers Overall transfer level: Needs assistance Equipment used: Rolling walker (2 wheeled) Transfers: Sit to/from Stand Sit to Stand: Min assist;Mod  assist;From elevated surface         General transfer comment: Min assist for rise with cues for safe hand placement on RW, Mod assist to steady in standing.  Ambulation/Gait Ambulation/Gait assistance: Mod assist Gait Distance (Feet): 12 Feet Assistive device: Rolling walker (2 wheeled) Gait Pattern/deviations: Step-through pattern;Decreased step length - right;Decreased step length - left;Decreased stride length;Shuffle;Narrow base of support Gait velocity: decr   General Gait Details: pt with poor step sequencing and required verbal/tactile cues for step patter. Assist required to manage RW and steady to prevent LOB. Pt with greater difficulty advancing Lt LE. Very unsteady.   Stairs            Wheelchair Mobility    Modified Rankin (Stroke Patients Only)       Balance Overall balance assessment: Needs assistance Sitting-balance support: Bilateral upper extremity supported Sitting balance-Leahy Scale: Fair     Standing balance support: During functional activity;Bilateral upper extremity supported Standing balance-Leahy Scale: Poor Standing balance comment: reliant on external support                Pertinent Vitals/Pain Pain Assessment: Faces Faces Pain Scale: No hurt Pain Intervention(s): Monitored during session;Repositioned    Home Living Family/patient expects to be discharged to:: Private residence Living Arrangements: Parent Available Help at Discharge: Family Type of Home: House Home Access: Stairs to enter Entrance Stairs-Rails: None Entrance Stairs-Number of Steps: 3 at back, 2 at front Home Layout: One level Home Equipment: Cane - single point;Walker - 4 wheels Additional Comments: pt poor historian so uncertain how much home/environment and PLOF is accurate. He reports he lives with his mother and has to help her physically with mobiltiy.  Prior Function Level of Independence: Independent with assistive device(s)         Comments:  pt reports he is still driving, food shopping, gets around with RW and has to physically assist his mother.      Hand Dominance        Extremity/Trunk Assessment   Upper Extremity Assessment Upper Extremity Assessment: Defer to OT evaluation    Lower Extremity Assessment Lower Extremity Assessment: Generalized weakness    Cervical / Trunk Assessment Cervical / Trunk Assessment: Kyphotic  Communication   Communication: No difficulties  Cognition Arousal/Alertness: Awake/alert Behavior During Therapy: Flat affect Overall Cognitive Status: No family/caregiver present to determine baseline cognitive functioning Area of Impairment: Memory;Orientation;Safety/judgement;Problem solving;Following commands;Awareness;Attention                 Orientation Level: Disoriented to;Situation Current Attention Level: Selective Memory: Decreased short-term memory;Decreased recall of precautions Following Commands: Follows one step commands with increased time;Follows multi-step commands inconsistently;Follows one step commands consistently Safety/Judgement: Decreased awareness of safety;Decreased awareness of deficits Awareness: Emergent Problem Solving: Slow processing;Difficulty sequencing        General Comments General comments (skin integrity, edema, etc.): pt wth bruises along Lt side above hip.    Exercises     Assessment/Plan    PT Assessment Patient needs continued PT services  PT Problem List Decreased strength;Decreased activity tolerance;Decreased balance;Decreased mobility;Decreased coordination;Decreased cognition;Decreased safety awareness;Decreased knowledge of use of DME       PT Treatment Interventions DME instruction;Gait training;Stair training;Functional mobility training;Therapeutic activities;Therapeutic exercise;Balance training;Cognitive remediation;Patient/family education    PT Goals (Current goals can be found in the Care Plan section)  Acute Rehab  PT Goals Patient Stated Goal: none stated; discussed improving balance PT Goal Formulation: With patient Time For Goal Achievement: 05/28/20 Potential to Achieve Goals: Good    Frequency Min 3X/week   Barriers to discharge           AM-PAC PT "6 Clicks" Mobility  Outcome Measure Help needed turning from your back to your side while in a flat bed without using bedrails?: A Little Help needed moving from lying on your back to sitting on the side of a flat bed without using bedrails?: A Lot Help needed moving to and from a bed to a chair (including a wheelchair)?: A Lot Help needed standing up from a chair using your arms (e.g., wheelchair or bedside chair)?: A Lot Help needed to walk in hospital room?: A Lot Help needed climbing 3-5 steps with a railing? : A Lot 6 Click Score: 13    End of Session Equipment Utilized During Treatment: Gait belt Activity Tolerance: Patient tolerated treatment well Patient left: in bed;with call bell/phone within reach Nurse Communication: Mobility status PT Visit Diagnosis: Difficulty in walking, not elsewhere classified (R26.2);Muscle weakness (generalized) (M62.81);Unsteadiness on feet (R26.81);History of falling (Z91.81)    Time: 6979-4801 PT Time Calculation (min) (ACUTE ONLY): 27 min   Charges:   PT Evaluation $PT Eval Low Complexity: 1 Low PT Treatments $Gait Training: 8-22 mins        Verner Mould, DPT Acute Rehabilitation Services  Office [phone] Pager [phone]  05/14/2020 2:31 PM

## 2020-05-15 ENCOUNTER — Other Ambulatory Visit: Payer: Self-pay

## 2020-05-15 DIAGNOSIS — F329 Major depressive disorder, single episode, unspecified: Secondary | ICD-10-CM

## 2020-05-15 DIAGNOSIS — R5381 Other malaise: Secondary | ICD-10-CM

## 2020-05-15 DIAGNOSIS — I10 Essential (primary) hypertension: Secondary | ICD-10-CM

## 2020-05-15 DIAGNOSIS — G9341 Metabolic encephalopathy: Secondary | ICD-10-CM

## 2020-05-15 DIAGNOSIS — J0100 Acute maxillary sinusitis, unspecified: Secondary | ICD-10-CM

## 2020-05-15 DIAGNOSIS — J189 Pneumonia, unspecified organism: Secondary | ICD-10-CM

## 2020-05-15 DIAGNOSIS — R7989 Other specified abnormal findings of blood chemistry: Secondary | ICD-10-CM

## 2020-05-15 DIAGNOSIS — F32A Depression, unspecified: Secondary | ICD-10-CM

## 2020-05-15 LAB — CBC
HCT: 35.3 % — ABNORMAL LOW (ref 39.0–52.0)
Hemoglobin: 12 g/dL — ABNORMAL LOW (ref 13.0–17.0)
MCH: 31.9 pg (ref 26.0–34.0)
MCHC: 34 g/dL (ref 30.0–36.0)
MCV: 93.9 fL (ref 80.0–100.0)
Platelets: 219 10*3/uL (ref 150–400)
RBC: 3.76 MIL/uL — ABNORMAL LOW (ref 4.22–5.81)
RDW: 14 % (ref 11.5–15.5)
WBC: 7.6 10*3/uL (ref 4.0–10.5)
nRBC: 0 % (ref 0.0–0.2)

## 2020-05-15 LAB — COMPREHENSIVE METABOLIC PANEL
ALT: 90 U/L — ABNORMAL HIGH (ref 0–44)
AST: 195 U/L — ABNORMAL HIGH (ref 15–41)
Albumin: 2.3 g/dL — ABNORMAL LOW (ref 3.5–5.0)
Alkaline Phosphatase: 87 U/L (ref 38–126)
Anion gap: 10 (ref 5–15)
BUN: 9 mg/dL (ref 6–20)
CO2: 24 mmol/L (ref 22–32)
Calcium: 7.7 mg/dL — ABNORMAL LOW (ref 8.9–10.3)
Chloride: 96 mmol/L — ABNORMAL LOW (ref 98–111)
Creatinine, Ser: 0.35 mg/dL — ABNORMAL LOW (ref 0.61–1.24)
GFR calc Af Amer: >60 mL/min (ref >60)
GFR calc non Af Amer: >60 mL/min (ref >60)
Glucose, Bld: 126 mg/dL — ABNORMAL HIGH (ref 70–99)
Potassium: 3.8 mmol/L (ref 3.5–5.1)
Sodium: 130 mmol/L — ABNORMAL LOW (ref 135–145)
Total Bilirubin: 1.6 mg/dL — ABNORMAL HIGH (ref 0.3–1.2)
Total Protein: 6 g/dL — ABNORMAL LOW (ref 6.5–8.1)

## 2020-05-15 LAB — GLUCOSE, CAPILLARY
Glucose-Capillary: 124 mg/dL — ABNORMAL HIGH (ref 70–99)
Glucose-Capillary: 118 mg/dL — ABNORMAL HIGH (ref 70–99)
Glucose-Capillary: 134 mg/dL — ABNORMAL HIGH (ref 70–99)

## 2020-05-15 LAB — PROTIME-INR
INR: 1.2 (ref 0.8–1.2)
Prothrombin Time: 14.3 seconds (ref 11.4–15.2)

## 2020-05-15 LAB — URINE CULTURE: Culture: 2000 — AB

## 2020-05-15 LAB — MAGNESIUM: Magnesium: 2.1 mg/dL (ref 1.7–2.4)

## 2020-05-15 LAB — PHOSPHORUS: Phosphorus: 1.3 mg/dL — ABNORMAL LOW (ref 2.5–4.6)

## 2020-05-15 MED ORDER — POTASSIUM PHOSPHATES 15 MMOLE/5ML IV SOLN
30.0000 mmol | Freq: Once | INTRAVENOUS | Status: AC
  Administered 2020-05-15: 30 mmol via INTRAVENOUS
  Filled 2020-05-15: qty 10

## 2020-05-15 MED ORDER — ENOXAPARIN SODIUM 40 MG/0.4ML ~~LOC~~ SOLN
40.0000 mg | SUBCUTANEOUS | Status: DC
  Administered 2020-05-15 – 2020-05-20 (×6): 40 mg via SUBCUTANEOUS
  Filled 2020-05-15 (×6): qty 0.4

## 2020-05-15 NOTE — Progress Notes (Signed)
PROGRESS NOTE    WITTEN CERTAIN  PTW:[id number] DOB: 13-Aug-1961 DOA: 05/12/2020 PCP: [name], NP   Chief Complaint  Patient presents with  . Fall    Brief Narrative:  59 year old male with history of hypertension, diabetes mellitus type 2, GERD/hiatal hernia, depression, COPD, alcohol abuse, hepatitis C status post treatment/?  Cirrhosis of liver presented with altered mental status and fall.  Patient was confused on presentation.  Per EMS, mother was concerned for alcohol withdrawal; reported last drink on Thursday.  On presentation, WBCs was 12, sodium of 124, potassium 3.6 with elevated AST/ALT/total bili with ammonia 50.  Chest x-ray showed right hilar opacity/fullness.  Head CT was showing moderate to severe left maxillary sinusitis.  Covid test was negative.  He was started on IV fluids and antibiotics.   Assessment & Plan:   Active Problems:   Hepatic cirrhosis (HCC)   Type 2 diabetes mellitus with hyperlipidemia (HCC)   ETOH abuse   Essential hypertension   DTs (delirium tremens) ([place])   Hyponatremia   Hypokalemia   PNA (pneumonia)   SIRS (systemic inflammatory response syndrome) (HCC)   Pneumonia   Community acquired pneumonia of right lower lobe of lung   Elevated LFTs   Hypophosphatemia   Acute metabolic encephalopathy   Acute maxillary sinusitis   Depression   Physical deconditioning  #1 right lower lobe community-acquired pneumonia On presentation chest x-ray consistent with right hilar opacity/fullness.  CT abdomen showed a right lower lobe infiltrate.  Patient pancultured.  COVID-19 PCR negative.  Improving clinically.  Currently with sats of 93% on room air.  Continue IV Unasyn, IV azithromycin.  SLP.  Supportive care.  2.  Acute metabolic encephalopathy Likely secondary to alcohol dependence/withdrawal versus hepatic encephalopathy as patient noted to have ammonia level of 50 versus secondary to pneumonia.  Head CT done negative.  Improving  clinically.  Right upper quadrant ultrasound with hepatic steatosis.  Continue empiric IV antibiotics, Ativan withdrawal protocol.  Patient started on lactulose which we will continue.  Follow.  3.  Hyponatremia Likely secondary to poor oral intake.  Improving with hydration.  Follow.  4.  Hypokalemia/hypophosphatemia Replete.  5.  Alcohol abuse/alcohol withdrawal Currently stable.  Continue the Ativan withdrawal protocol, multivitamin, thiamine, folate.  Social work consulted.  Patient interested in alcohol cessation at this time.  6.  Transaminitis/probable alcoholic hepatitis Right upper quadrant ultrasound with hepatic steatosis with no findings of cirrhosis.  LFTs trending down.  Outpatient follow-up.  7.  Maxillary sinusitis IV Unasyn.  8.  Hypertension Continue current regimen of losartan and amlodipine.  9.  Well-controlled diabetes mellitus type 2 Hemoglobin A1c 6.1.  Continue to hold oral hypoglycemics and Invokana.  SSI.  10.  Depression Continue Cymbalta.  11.  Generalized deconditioning PT/OT.  SNF recommended.  Social work consulted.   DVT prophylaxis: Heparin Code Status: Full Family Communication: Updated patient and mother at bedside. Disposition:   Status is: Inpatient    Dispo: The patient is from: Home              Anticipated d/c is to: SNF              Anticipated d/c date is: To be determined              Patient currently on IV antibiotics for pneumonia, on the Ativan withdrawal protocol.  Not stable for discharge.       Consultants:   None  Procedures:   CT head without contrast  05/12/2020  CT abdomen and pelvis 05/13/2020  Chest x-ray 05/12/2020  Abdominal ultrasound 05/13/2020  Antimicrobials:  IV Unasyn 05/13/2020  IV azithromycin 05/12/2020  IV Rocephin 912 2021x1 dose   Subjective: Patient states feeling better.  Denies any chest pain shortness of breath or abdominal pain.  Alert to self place and time.  Mother at  bedside.  Patient interested in alcohol cessation.  Objective: Vitals:   05/14/20 2038 05/15/20 0003 05/15/20 0437 05/15/20 1529  BP: 125/73 129/76 131/83 132/78  Pulse: 96 100 97 95  Resp: (!) 22 (!) 23 20 16   Temp:  98.4 F (36.9 C) 97.8 F (36.6 C) 98.5 F (36.9 C)  TempSrc:  Oral Oral Oral  SpO2: 90% 90% 92% 93%  Weight:      Height:        Intake/Output Summary (Last 24 hours) at 05/15/2020 1829 Last data filed at 05/15/2020 1801 Gross per 24 hour  Intake 1584.94 ml  Output 3200 ml  Net -1615.06 ml   Filed Weights   05/12/20 1900 05/14/20 1542  Weight: 81.2 kg 79.4 kg    Examination:  General exam: NAD Respiratory system: Coarse breath sounds in the right base.  No wheezing.  Fair air movement.  Cardiovascular system: S1 & S2 heard, RRR. No JVD, murmurs, rubs, gallops or clicks. No pedal edema. Gastrointestinal system: Abdomen is nondistended, soft and nontender. No organomegaly or masses felt. Normal bowel sounds heard. Central nervous system: Alert and oriented. No focal neurological deficits. Extremities: Symmetric 5 x 5 power. Skin: No rashes, lesions or ulcers Psychiatry: Judgement and insight appear normal. Mood & affect appropriate.     Data Reviewed: I have personally reviewed following labs and imaging studies  CBC: Recent Labs  Lab 05/12/20 2120 05/13/20 0512 05/14/20 0420 05/15/20 0520  WBC 11.7* 9.7 8.3 7.6  NEUTROABS 10.5*  --   --   --   HGB 12.5* 11.6* 11.2* 12.0*  HCT 35.3* 32.4* 32.8* 35.3*  MCV 88.9 88.5 91.6 93.9  PLT 147* 144* 189 604    Basic Metabolic Panel: Recent Labs  Lab 05/12/20 2120 05/13/20 0511 05/13/20 0512 05/14/20 0420 05/15/20 0520  NA 124*  --  123* 126* 130*  K 2.6*  --  2.6* 3.2* 3.8  CL 86*  --  87* 93* 96*  CO2 25  --  25 25 24   GLUCOSE 163*  --  124* 112* 126*  BUN 18  --  14 13 9   CREATININE 0.48*  --  0.43* 0.37* 0.35*  CALCIUM 8.3*  --  7.6* 7.5* 7.7*  MG 2.5* 2.3  --  2.3 2.1  PHOS  --  1.3*   --   --  1.3*    GFR: Estimated Creatinine Clearance: 108.5 mL/min (A) (by C-G formula based on SCr of 0.35 mg/dL (L)).  Liver Function Tests: Recent Labs  Lab 05/12/20 2120 05/13/20 0512 05/14/20 0420 05/15/20 0520  AST 376* 339* 269* 195*  ALT 129* 118* 104* 90*  ALKPHOS 71 61 67 87  BILITOT 2.2* 1.8* 1.7* 1.6*  PROT 6.9 6.1* 5.9* 6.0*  ALBUMIN 2.8* 2.5* 2.3* 2.3*    CBG: Recent Labs  Lab 05/14/20 1742 05/14/20 2227 05/15/20 0749 05/15/20 1231 05/15/20 1754  GLUCAP 147* 121* 124* 118* 134*     Recent Results (from the past 240 hour(s))  Blood culture (routine single)     Status: None (Preliminary result)   Collection Time: 05/12/20  9:20 PM   Specimen: BLOOD LEFT FOREARM  Result Value Ref Range Status   Specimen Description   Final    BLOOD LEFT FOREARM Performed at [facility] [address] [place], [place] [address]    Special Requests   Final    BOTTLES DRAWN AEROBIC AND ANAEROBIC Blood Culture adequate volume Performed at [facility] [address], [place], [place] [address]    Culture   Final    NO GROWTH 2 DAYS Performed at [facility] [address], [place], [place] [address]    Report Status PENDING  Incomplete  SARS Coronavirus 2 by RT PCR (hospital order, performed in [facility] hospital lab) Nasopharyngeal Nasopharyngeal Swab     Status: None   Collection Time: 05/12/20  9:20 PM   Specimen: Nasopharyngeal Swab  Result Value Ref Range Status   SARS Coronavirus 2 NEGATIVE NEGATIVE Final    Comment: (NOTE) SARS-CoV-2 target nucleic acids are NOT DETECTED.  The SARS-CoV-2 RNA is generally detectable in upper and lower respiratory specimens during the acute phase of infection. The lowest concentration of SARS-CoV-2 viral copies this assay can detect is 250 copies / mL. A negative result does not preclude SARS-CoV-2 infection and should not be used as the sole basis for treatment or  other patient management decisions.  A negative result may occur with improper specimen collection / handling, submission of specimen other than nasopharyngeal swab, presence of viral mutation(s) within the areas targeted by this assay, and inadequate number of viral copies (<250 copies / mL). A negative result must be combined with clinical observations, patient history, and epidemiological information.  Fact Sheet for Patients:   StrictlyIdeas.no  Fact Sheet for Healthcare Providers: BankingDealers.co.za  This test is not yet approved or  cleared by the [place] FDA and has been authorized for detection and/or diagnosis of SARS-CoV-2 by FDA under an Emergency Use Authorization (EUA).  This EUA will remain in effect (meaning this test can be used) for the duration of the COVID-19 declaration under Section 564(b)(1) of the Act, 21 U.S.C. section 360bbb-3(b)(1), unless the authorization is terminated or revoked sooner.  Performed at [facility], [place] [address], [place], [place] [address]   Culture, blood (single)     Status: None (Preliminary result)   Collection Time: 05/12/20  9:20 PM   Specimen: BLOOD LEFT HAND  Result Value Ref Range Status   Specimen Description   Final    BLOOD LEFT HAND Performed at [facility] [address] [place], [place] [address]    Special Requests   Final    BOTTLES DRAWN AEROBIC AND ANAEROBIC Blood Culture adequate volume Performed at [facility] [address], [place], [place] [address]    Culture   Final    NO GROWTH 2 DAYS Performed at [facility] [address], [place], [place] [address]    Report Status PENDING  Incomplete  Urine culture     Status: Abnormal   Collection Time: 05/13/20  9:09 AM   Specimen: In/Out Cath Urine  Result Value Ref Range Status   Specimen Description   Final    IN/OUT CATH  URINE Performed at [facility] [address], [place], [place] [address]    Special Requests   Final    NONE Performed at [facility], [place] [address] [place], [place] [address]    Culture 2,000 COLONIES/mL STAPHYLOCOCCUS HAEMOLYTICUS (A)  Final   Report Status 05/15/2020  FINAL  Final   Organism ID, Bacteria STAPHYLOCOCCUS HAEMOLYTICUS (A)  Final      Susceptibility   Staphylococcus haemolyticus - MIC*    CIPROFLOXACIN <=0.5 SENSITIVE Sensitive     GENTAMICIN 2 SENSITIVE Sensitive     NITROFURANTOIN <=16 SENSITIVE Sensitive     OXACILLIN >=4 RESISTANT Resistant     TETRACYCLINE 4 SENSITIVE Sensitive     VANCOMYCIN 2 SENSITIVE Sensitive     TRIMETH/SULFA 160 RESISTANT Resistant     CLINDAMYCIN >=8 RESISTANT Resistant     RIFAMPIN <=0.5 SENSITIVE Sensitive     Inducible Clindamycin NEGATIVE Sensitive     * 2,000 COLONIES/mL STAPHYLOCOCCUS HAEMOLYTICUS         Radiology Studies: No results found.      Scheduled Meds: . amLODipine  10 mg Oral Daily  . aspirin EC  81 mg Oral Daily  . atorvastatin  40 mg Oral Daily  . DULoxetine  30 mg Oral Daily  . enoxaparin (LOVENOX) injection  40 mg Subcutaneous Q24H  . folic acid  1 mg Oral Daily  . insulin aspart  0-15 Units Subcutaneous TID WC  . lactulose  10 g Oral BID  . loratadine  10 mg Oral Daily  . losartan  100 mg Oral Daily  . multivitamin with minerals  1 tablet Oral Daily  . pantoprazole (PROTONIX) IV  40 mg Intravenous Q12H  . thiamine  100 mg Oral Daily   Or  . thiamine  100 mg Intravenous Daily   Continuous Infusions: . 0.9 % NaCl with KCl 40 mEq / L 100 mL/hr at 05/15/20 1712  . ampicillin-sulbactam (UNASYN) IV 3 g (05/15/20 1716)  . azithromycin Stopped (05/15/20 0103)  . potassium PHOSPHATE IVPB (in mmol) 30 mmol (05/15/20 1303)     LOS: 2 days    Time spent: 35 minutes    Irine Seal, MD [facility]   To contact the attending provider between  7A-7P or the covering provider during after hours 7P-7A, please log into the web site [email or web address] and access using universal [place] password for that web site. If you do not have the password, please call the hospital operator.  05/15/2020, 6:29 PM

## 2020-05-16 LAB — COMPREHENSIVE METABOLIC PANEL
ALT: 86 U/L — ABNORMAL HIGH (ref 0–44)
AST: 161 U/L — ABNORMAL HIGH (ref 15–41)
Albumin: 2.5 g/dL — ABNORMAL LOW (ref 3.5–5.0)
Alkaline Phosphatase: 99 U/L (ref 38–126)
Anion gap: 7 (ref 5–15)
BUN: 7 mg/dL (ref 6–20)
CO2: 22 mmol/L (ref 22–32)
Calcium: 7.8 mg/dL — ABNORMAL LOW (ref 8.9–10.3)
Chloride: 97 mmol/L — ABNORMAL LOW (ref 98–111)
Creatinine, Ser: 0.35 mg/dL — ABNORMAL LOW (ref 0.61–1.24)
GFR calc Af Amer: >60 mL/min (ref >60)
GFR calc non Af Amer: >60 mL/min (ref >60)
Glucose, Bld: 131 mg/dL — ABNORMAL HIGH (ref 70–99)
Potassium: 4.3 mmol/L (ref 3.5–5.1)
Sodium: 126 mmol/L — ABNORMAL LOW (ref 135–145)
Total Bilirubin: 1.7 mg/dL — ABNORMAL HIGH (ref 0.3–1.2)
Total Protein: 6.5 g/dL (ref 6.5–8.1)

## 2020-05-16 LAB — GLUCOSE, CAPILLARY
Glucose-Capillary: 136 mg/dL — ABNORMAL HIGH (ref 70–99)
Glucose-Capillary: 129 mg/dL — ABNORMAL HIGH (ref 70–99)
Glucose-Capillary: 133 mg/dL — ABNORMAL HIGH (ref 70–99)
Glucose-Capillary: 173 mg/dL — ABNORMAL HIGH (ref 70–99)
Glucose-Capillary: 133 mg/dL — ABNORMAL HIGH (ref 70–99)

## 2020-05-16 LAB — BASIC METABOLIC PANEL
Anion gap: 12 (ref 5–15)
BUN: 10 mg/dL (ref 6–20)
CO2: 23 mmol/L (ref 22–32)
Calcium: 8.5 mg/dL — ABNORMAL LOW (ref 8.9–10.3)
Chloride: 96 mmol/L — ABNORMAL LOW (ref 98–111)
Creatinine, Ser: 0.4 mg/dL — ABNORMAL LOW (ref 0.61–1.24)
GFR calc Af Amer: >60 mL/min (ref >60)
GFR calc non Af Amer: >60 mL/min (ref >60)
Glucose, Bld: 133 mg/dL — ABNORMAL HIGH (ref 70–99)
Potassium: 4 mmol/L (ref 3.5–5.1)
Sodium: 131 mmol/L — ABNORMAL LOW (ref 135–145)

## 2020-05-16 LAB — CBC WITH DIFFERENTIAL/PLATELET
Abs Immature Granulocytes: 0.05 10*3/uL (ref 0.00–0.07)
Basophils Absolute: 0.1 10*3/uL (ref 0.0–0.1)
Basophils Relative: 1 %
Eosinophils Absolute: 0.1 10*3/uL (ref 0.0–0.5)
Eosinophils Relative: 2 %
HCT: 36.5 % — ABNORMAL LOW (ref 39.0–52.0)
Hemoglobin: 12.5 g/dL — ABNORMAL LOW (ref 13.0–17.0)
Immature Granulocytes: 1 %
Lymphocytes Relative: 15 %
Lymphs Abs: 1.2 10*3/uL (ref 0.7–4.0)
MCH: 31.9 pg (ref 26.0–34.0)
MCHC: 34.2 g/dL (ref 30.0–36.0)
MCV: 93.1 fL (ref 80.0–100.0)
Monocytes Absolute: 0.8 10*3/uL (ref 0.1–1.0)
Monocytes Relative: 10 %
Neutro Abs: 5.9 10*3/uL (ref 1.7–7.7)
Neutrophils Relative %: 71 %
Platelets: 303 10*3/uL (ref 150–400)
RBC: 3.92 MIL/uL — ABNORMAL LOW (ref 4.22–5.81)
RDW: 14.2 % (ref 11.5–15.5)
WBC: 8.2 10*3/uL (ref 4.0–10.5)
nRBC: 0 % (ref 0.0–0.2)

## 2020-05-16 LAB — AMMONIA: Ammonia: 25 umol/L (ref 9–35)

## 2020-05-16 LAB — URIC ACID: Uric Acid, Serum: 1.1 mg/dL — ABNORMAL LOW (ref 3.7–8.6)

## 2020-05-16 LAB — MAGNESIUM: Magnesium: 2.2 mg/dL (ref 1.7–2.4)

## 2020-05-16 LAB — TSH: TSH: 3.566 u[IU]/mL (ref 0.350–4.500)

## 2020-05-16 LAB — OSMOLALITY: Osmolality: 270 mOsm/kg — ABNORMAL LOW (ref 275–295)

## 2020-05-16 LAB — PHOSPHORUS: Phosphorus: 2.2 mg/dL — ABNORMAL LOW (ref 2.5–4.6)

## 2020-05-16 MED ORDER — PANTOPRAZOLE SODIUM 40 MG PO TBEC
40.0000 mg | DELAYED_RELEASE_TABLET | Freq: Two times a day (BID) | ORAL | Status: DC
  Administered 2020-05-16 – 2020-05-21 (×10): 40 mg via ORAL
  Filled 2020-05-16 (×10): qty 1

## 2020-05-16 MED ORDER — AMOXICILLIN-POT CLAVULANATE 875-125 MG PO TABS
1.0000 | ORAL_TABLET | Freq: Two times a day (BID) | ORAL | Status: DC
  Administered 2020-05-16 – 2020-05-19 (×8): 1 via ORAL
  Filled 2020-05-16 (×8): qty 1

## 2020-05-16 NOTE — Progress Notes (Signed)
Updated patient mother x3 today. Patient is stable and no s/sx of withdrawal at this time periods of intermittent confusion.

## 2020-05-16 NOTE — Plan of Care (Signed)

## 2020-05-16 NOTE — Progress Notes (Signed)
Patient mother updated by phone today.

## 2020-05-16 NOTE — Progress Notes (Signed)
Physical Therapy Treatment Patient Details Name: [name] [name] MRN: [id number] DOB: July 14, 1961 Today's Date: 05/16/2020    History of Present Illness 59 year old male with history of hypertension, diabetes mellitus type 2, GERD/hiatal hernia, depression, COPD, alcohol abuse, hepatitis C, cirrhosis of liver. Pt presented with altered mental status and fall. Patient was confused on presentation.  Per EMS, mother was concerned for alcohol withdrawal.    PT Comments    Pt with noted improvement in activity tolerance and improving ambulatory stability but continues to require assist for safe performance of basic mobility tasks.     Follow Up Recommendations  SNF;Supervision/Assistance - 24 hour     Equipment Recommendations  Other (comment) (TBA)    Recommendations for Other Services OT consult     Precautions / Restrictions Precautions Precautions: Fall Restrictions Weight Bearing Restrictions: No    Mobility  Bed Mobility Overal bed mobility: Needs Assistance Bed Mobility: Supine to Sit     Supine to sit: Supervision     General bed mobility comments: Increased time but no physical assist  Transfers Overall transfer level: Needs assistance Equipment used: Rolling walker (2 wheeled) Transfers: Sit to/from Stand Sit to Stand: Min assist         General transfer comment: min assist to bring wt up and fwd and to balance in standing  Ambulation/Gait Ambulation/Gait assistance: Min assist Gait Distance (Feet): 58 Feet (2x29') Assistive device: Rolling walker (2 wheeled) Gait Pattern/deviations: Step-through pattern;Decreased step length - right;Decreased step length - left;Decreased stride length;Shuffle;Narrow base of support Gait velocity: decr   General Gait Details: cues for posture and position from RW; physical assist for balance and RW management   Stairs             Wheelchair Mobility    Modified Rankin (Stroke Patients Only)       Balance  Overall balance assessment: Needs assistance Sitting-balance support: Bilateral upper extremity supported Sitting balance-Leahy Scale: Fair     Standing balance support: During functional activity;Bilateral upper extremity supported Standing balance-Leahy Scale: Poor Standing balance comment: reliant on external support                            Cognition Arousal/Alertness: Awake/alert Behavior During Therapy: Flat affect Overall Cognitive Status: No family/caregiver present to determine baseline cognitive functioning Area of Impairment: Memory;Orientation;Safety/judgement;Problem solving;Following commands;Awareness;Attention                       Following Commands: Follows one step commands with increased time;Follows multi-step commands inconsistently;Follows one step commands consistently Safety/Judgement: Decreased awareness of safety;Decreased awareness of deficits   Problem Solving: Slow processing        Exercises      General Comments        Pertinent Vitals/Pain Pain Assessment: No/denies pain Pain Intervention(s): Monitored during session    Home Living                      Prior Function            PT Goals (current goals can now be found in the care plan section) Acute Rehab PT Goals Patient Stated Goal: none stated PT Goal Formulation: With patient Time For Goal Achievement: 05/28/20 Potential to Achieve Goals: Good Progress towards PT goals: Progressing toward goals    Frequency    Min 3X/week      PT Plan Current plan remains appropriate  Co-evaluation              AM-PAC PT "6 Clicks" Mobility   Outcome Measure  Help needed turning from your back to your side while in a flat bed without using bedrails?: None Help needed moving from lying on your back to sitting on the side of a flat bed without using bedrails?: A Little Help needed moving to and from a bed to a chair (including a wheelchair)?: A  Little Help needed standing up from a chair using your arms (e.g., wheelchair or bedside chair)?: A Little Help needed to walk in hospital room?: A Little Help needed climbing 3-5 steps with a railing? : A Lot 6 Click Score: 18    End of Session Equipment Utilized During Treatment: Gait belt Activity Tolerance: Patient tolerated treatment well Patient left: in chair;with call bell/phone within reach;with chair alarm set Nurse Communication: Mobility status PT Visit Diagnosis: Difficulty in walking, not elsewhere classified (R26.2);Muscle weakness (generalized) (M62.81);Unsteadiness on feet (R26.81);History of falling (Z91.81)     Time: 2072-1828 PT Time Calculation (min) (ACUTE ONLY): 22 min  Charges:  $Gait Training: 8-22 mins                     [facility] [place] Pager [phone] Office [phone]    Unita Detamore 05/16/2020, 5:02 PM

## 2020-05-16 NOTE — Progress Notes (Signed)
PROGRESS NOTE    [name] AMRHEIN  YOV:[id number] DOB: [birth year] DOA: 05/12/2020 PCP: [name], NP   Chief Complaint  Patient presents with  . Fall    Brief Narrative:  59 year old male with history of hypertension, diabetes mellitus type 2, GERD/hiatal hernia, depression, COPD, alcohol abuse, hepatitis C status post treatment/?  Cirrhosis of liver presented with altered mental status and fall.  Patient was confused on presentation.  Per EMS, mother was concerned for alcohol withdrawal; reported last drink on Thursday.  On presentation, WBCs was 12, sodium of 124, potassium 3.6 with elevated AST/ALT/total bili with ammonia 50.  Chest x-ray showed right hilar opacity/fullness.  Head CT was showing moderate to severe left maxillary sinusitis.  Covid test was negative.  He was started on IV fluids and antibiotics.   Assessment & Plan:   Active Problems:   Hepatic cirrhosis (HCC)   Type 2 diabetes mellitus with hyperlipidemia (HCC)   ETOH abuse   Essential hypertension   DTs (delirium tremens) ([place])   Hyponatremia   Hypokalemia   PNA (pneumonia)   SIRS (systemic inflammatory response syndrome) (HCC)   Pneumonia   Community acquired pneumonia of right lower lobe of lung   Elevated LFTs   Hypophosphatemia   Acute metabolic encephalopathy   Acute maxillary sinusitis   Depression   Physical deconditioning  #1 right lower lobe community-acquired pneumonia On presentation chest x-ray consistent with right hilar opacity/fullness.  CT abdomen showed a right lower lobe infiltrate.  Patient pancultured.  COVID-19 PCR negative.  Improving clinically.  Currently with sats of 93-95% on room air.  Continue IV azithromycin through today and discontinue after today's dose as patient would have had 5 days of azithromycin.  Change IV Unasyn to Augmentin to complete 7 -10-day course of antibiotic treatment.  Supportive care.  Follow.   2.  Acute metabolic encephalopathy Likely secondary to  alcohol dependence/withdrawal versus hepatic encephalopathy as patient noted to have ammonia level of 50 versus secondary to pneumonia.  Head CT done negative.  Improving clinically.  Right upper quadrant ultrasound with hepatic steatosis.  Continue antibiotics.  Ativan withdrawal protocol.  Patient started on lactulose and will discontinue lactulose after today's doses.  Follow.    3.  Hyponatremia Likely secondary to poor oral intake versus SIADH.  Sodium levels fluctuated and trended back down to 126.  Urine electrolytes, urine osmolality, serum osmolality pending.  Uric acid level ordered and at 1.1.  Saline lock IV fluids.  Fluid restrict to 1500 cc/day.  Repeat labs this afternoon.  Follow..   4.  Hypokalemia/hypophosphatemia Repleted.  Repeat labs in the morning.  5.  Alcohol abuse/alcohol withdrawal Currently stable.  Continue the Ativan withdrawal protocol, multivitamin, thiamine, folate.  Social work consulted.  Patient interested in alcohol cessation at this time.  6.  Transaminitis/probable alcoholic hepatitis Right upper quadrant ultrasound with hepatic steatosis with no findings of cirrhosis.  LFTs trending down.  Outpatient follow-up.  7.  Maxillary sinusitis Transition from IV Unasyn to Augmentin.  Follow.    8.  Hypertension Stable.  Continue amlodipine and losartan.  9.  Well-controlled diabetes mellitus type 2 Hemoglobin A1c 6.1.  CBG of 129 this morning.  Continue to hold oral hypoglycemic agents and Invokana.  Sliding scale insulin.    10.  Depression Cymbalta.   11.  Generalized deconditioning PT/OT.  SNF recommended.  Social work consulted.   DVT prophylaxis: Heparin Code Status: Full Family Communication: Updated patient.  No family at bedside.  Disposition:  Status is: Inpatient    Dispo: The patient is from: Home              Anticipated d/c is to: SNF              Anticipated d/c date is: To be determined              Patient currently on IV  antibiotics for pneumonia, on the Ativan withdrawal protocol.  Not stable for discharge.       Consultants:   None  Procedures:   CT head without contrast 05/12/2020  CT abdomen and pelvis 05/13/2020  Chest x-ray 05/12/2020  Abdominal ultrasound 05/13/2020  Antimicrobials:  IV Unasyn 05/13/2020>>>> 05/16/2020  IV azithromycin 05/12/2020>>>>05/16/2020  IV Rocephin 9/12/ 2021x1 dose  Augmentin 05/16/2020>>>>   Subjective: Patient on bedpan.  Denies any chest pain or shortness of breath.  Feeling better.    Objective: Vitals:   05/15/20 0437 05/15/20 1529 05/15/20 2009 05/16/20 0514  BP: 131/83 132/78 136/82 (!) 148/79  Pulse: 97 95 90 79  Resp: 20 16 20 20   Temp: 97.8 F (36.6 C) 98.5 F (36.9 C) 98.1 F (36.7 C) 97.9 F (36.6 C)  TempSrc: Oral Oral Oral Oral  SpO2: 92% 93% 95%   Weight:      Height:        Intake/Output Summary (Last 24 hours) at 05/16/2020 1145 Last data filed at 05/16/2020 1016 Gross per 24 hour  Intake 964.94 ml  Output 1950 ml  Net -985.06 ml   Filed Weights   05/12/20 1900 05/14/20 1542  Weight: 81.2 kg 79.4 kg    Examination:  General exam: NAD Respiratory system: Some coarse breath sounds in the right base.  No wheezing noted.  Fair air movement.  Speaking in full sentences.  Cardiovascular system: Regular rate and rhythm no murmurs rubs or gallops.  No JVD.  No lower extremity edema.  Gastrointestinal system: Abdomen is soft, nontender, nondistended, positive bowel sounds.  No rebound.  No guarding.  Central nervous system: Alert and oriented. No focal neurological deficits. Extremities: Symmetric 5 x 5 power. Skin: No rashes, lesions or ulcers Psychiatry: Judgement and insight appear normal. Mood & affect appropriate.     Data Reviewed: I have personally reviewed following labs and imaging studies  CBC: Recent Labs  Lab 05/12/20 2120 05/13/20 0512 05/14/20 0420 05/15/20 0520 05/16/20 0556  WBC 11.7* 9.7 8.3 7.6 8.2    NEUTROABS 10.5*  --   --   --  5.9  HGB 12.5* 11.6* 11.2* 12.0* 12.5*  HCT 35.3* 32.4* 32.8* 35.3* 36.5*  MCV 88.9 88.5 91.6 93.9 93.1  PLT 147* 144* 189 219 242    Basic Metabolic Panel: Recent Labs  Lab 05/12/20 2120 05/13/20 0511 05/13/20 0512 05/14/20 0420 05/15/20 0520 05/16/20 0556  NA 124*  --  123* 126* 130* 126*  K 2.6*  --  2.6* 3.2* 3.8 4.3  CL 86*  --  87* 93* 96* 97*  CO2 25  --  25 25 24 22   GLUCOSE 163*  --  124* 112* 126* 131*  BUN 18  --  14 13 9 7   CREATININE 0.48*  --  0.43* 0.37* 0.35* 0.35*  CALCIUM 8.3*  --  7.6* 7.5* 7.7* 7.8*  MG 2.5* 2.3  --  2.3 2.1 2.2  PHOS  --  1.3*  --   --  1.3* 2.2*    GFR: Estimated Creatinine Clearance: 108.5 mL/min (A) (by C-G formula based on  SCr of 0.35 mg/dL (L)).  Liver Function Tests: Recent Labs  Lab 05/12/20 2120 05/13/20 0512 05/14/20 0420 05/15/20 0520 05/16/20 0556  AST 376* 339* 269* 195* 161*  ALT 129* 118* 104* 90* 86*  ALKPHOS 71 61 67 87 99  BILITOT 2.2* 1.8* 1.7* 1.6* 1.7*  PROT 6.9 6.1* 5.9* 6.0* 6.5  ALBUMIN 2.8* 2.5* 2.3* 2.3* 2.5*    CBG: Recent Labs  Lab 05/15/20 0749 05/15/20 1231 05/15/20 1754 05/15/20 2230 05/16/20 0739  GLUCAP 124* 118* 134* 136* 129*     Recent Results (from the past 240 hour(s))  Blood culture (routine single)     Status: None (Preliminary result)   Collection Time: 05/12/20  9:20 PM   Specimen: BLOOD LEFT FOREARM  Result Value Ref Range Status   Specimen Description   Final    BLOOD LEFT FOREARM Performed at [facility], [place] [address], [place], [place] [address]    Special Requests   Final    BOTTLES DRAWN AEROBIC AND ANAEROBIC Blood Culture adequate volume Performed at [facility] [address], [place], [place] [address]    Culture   Final    NO GROWTH 2 DAYS Performed at [facility] [address] [place], [place] [address]    Report Status PENDING  Incomplete  SARS Coronavirus 2 by RT PCR  (hospital order, performed in [facility] hospital lab) Nasopharyngeal Nasopharyngeal Swab     Status: None   Collection Time: 05/12/20  9:20 PM   Specimen: Nasopharyngeal Swab  Result Value Ref Range Status   SARS Coronavirus 2 NEGATIVE NEGATIVE Final    Comment: (NOTE) SARS-CoV-2 target nucleic acids are NOT DETECTED.  The SARS-CoV-2 RNA is generally detectable in upper and lower respiratory specimens during the acute phase of infection. The lowest concentration of SARS-CoV-2 viral copies this assay can detect is 250 copies / mL. A negative result does not preclude SARS-CoV-2 infection and should not be used as the sole basis for treatment or other patient management decisions.  A negative result may occur with improper specimen collection / handling, submission of specimen other than nasopharyngeal swab, presence of viral mutation(s) within the areas targeted by this assay, and inadequate number of viral copies (<250 copies / mL). A negative result must be combined with clinical observations, patient history, and epidemiological information.  Fact Sheet for Patients:   StrictlyIdeas.no  Fact Sheet for Healthcare Providers: BankingDealers.co.za  This test is not yet approved or  cleared by the [place] FDA and has been authorized for detection and/or diagnosis of SARS-CoV-2 by FDA under an Emergency Use Authorization (EUA).  This EUA will remain in effect (meaning this test can be used) for the duration of the COVID-19 declaration under Section 564(b)(1) of the Act, 21 U.S.C. section 360bbb-3(b)(1), unless the authorization is terminated or revoked sooner.  Performed at [facility], [place] [address], [place], [place] [address]   Culture, blood (single)     Status: None (Preliminary result)   Collection Time: 05/12/20  9:20 PM   Specimen: BLOOD LEFT HAND  Result Value Ref Range Status   Specimen Description    Final    BLOOD LEFT HAND Performed at [facility] [address] [place], [place] [address]    Special Requests   Final    BOTTLES DRAWN AEROBIC AND ANAEROBIC Blood Culture adequate volume Performed at [facility] [address] [place], [place] [address]  Culture   Final    NO GROWTH 2 DAYS Performed at [facility], [place] [address] [place], [place] [address]    Report Status PENDING  Incomplete  Urine culture     Status: Abnormal   Collection Time: 05/13/20  9:09 AM   Specimen: In/Out Cath Urine  Result Value Ref Range Status   Specimen Description   Final    IN/OUT CATH URINE Performed at [facility] [address] [place], [place] [address]    Special Requests   Final    NONE Performed at [facility], [place] [address], [place], [place] [address]    Culture 2,000 COLONIES/mL STAPHYLOCOCCUS HAEMOLYTICUS (A)  Final   Report Status 05/15/2020 FINAL  Final   Organism ID, Bacteria STAPHYLOCOCCUS HAEMOLYTICUS (A)  Final      Susceptibility   Staphylococcus haemolyticus - MIC*    CIPROFLOXACIN <=0.5 SENSITIVE Sensitive     GENTAMICIN 2 SENSITIVE Sensitive     NITROFURANTOIN <=16 SENSITIVE Sensitive     OXACILLIN >=4 RESISTANT Resistant     TETRACYCLINE 4 SENSITIVE Sensitive     VANCOMYCIN 2 SENSITIVE Sensitive     TRIMETH/SULFA 160 RESISTANT Resistant     CLINDAMYCIN >=8 RESISTANT Resistant     RIFAMPIN <=0.5 SENSITIVE Sensitive     Inducible Clindamycin NEGATIVE Sensitive     * 2,000 COLONIES/mL STAPHYLOCOCCUS HAEMOLYTICUS         Radiology Studies: No results found.      Scheduled Meds: . amLODipine  10 mg Oral Daily  . amoxicillin-clavulanate  1 tablet Oral Q12H  . aspirin EC  81 mg Oral Daily  . atorvastatin  40 mg Oral Daily  . DULoxetine  30 mg Oral Daily  . enoxaparin (LOVENOX) injection  40 mg Subcutaneous Q24H  . folic acid  1 mg Oral Daily  . insulin aspart   0-15 Units Subcutaneous TID WC  . lactulose  10 g Oral BID  . loratadine  10 mg Oral Daily  . losartan  100 mg Oral Daily  . multivitamin with minerals  1 tablet Oral Daily  . pantoprazole  40 mg Oral BID  . thiamine  100 mg Oral Daily   Or  . thiamine  100 mg Intravenous Daily   Continuous Infusions: . azithromycin 500 mg (05/15/20 2202)     LOS: 3 days    Time spent: 35 minutes    Irine Seal, MD [facility]   To contact the attending provider between 7A-7P or the covering provider during after hours 7P-7A, please log into the web site [email or web address] and access using universal [place] password for that web site. If you do not have the password, please call the hospital operator.  05/16/2020, 11:45 AM

## 2020-05-17 LAB — COMPREHENSIVE METABOLIC PANEL
ALT: 101 U/L — ABNORMAL HIGH (ref 0–44)
AST: 159 U/L — ABNORMAL HIGH (ref 15–41)
Albumin: 2.5 g/dL — ABNORMAL LOW (ref 3.5–5.0)
Alkaline Phosphatase: 101 U/L (ref 38–126)
Anion gap: 8 (ref 5–15)
BUN: 11 mg/dL (ref 6–20)
CO2: 20 mmol/L — ABNORMAL LOW (ref 22–32)
Calcium: 7.7 mg/dL — ABNORMAL LOW (ref 8.9–10.3)
Chloride: 99 mmol/L (ref 98–111)
Creatinine, Ser: 0.35 mg/dL — ABNORMAL LOW (ref 0.61–1.24)
GFR calc Af Amer: >60 mL/min (ref >60)
GFR calc non Af Amer: >60 mL/min (ref >60)
Glucose, Bld: 137 mg/dL — ABNORMAL HIGH (ref 70–99)
Potassium: 3.8 mmol/L (ref 3.5–5.1)
Sodium: 127 mmol/L — ABNORMAL LOW (ref 135–145)
Total Bilirubin: 1.9 mg/dL — ABNORMAL HIGH (ref 0.3–1.2)
Total Protein: 6.5 g/dL (ref 6.5–8.1)

## 2020-05-17 LAB — SODIUM, URINE, RANDOM: Sodium, Ur: 154 mmol/L

## 2020-05-17 LAB — CREATININE, URINE, RANDOM: Creatinine, Urine: 73.42 mg/dL

## 2020-05-17 LAB — GLUCOSE, CAPILLARY
Glucose-Capillary: 192 mg/dL — ABNORMAL HIGH (ref 70–99)
Glucose-Capillary: 124 mg/dL — ABNORMAL HIGH (ref 70–99)
Glucose-Capillary: 207 mg/dL — ABNORMAL HIGH (ref 70–99)
Glucose-Capillary: 131 mg/dL — ABNORMAL HIGH (ref 70–99)

## 2020-05-17 LAB — PHOSPHORUS: Phosphorus: 2.9 mg/dL (ref 2.5–4.6)

## 2020-05-17 LAB — MAGNESIUM: Magnesium: 2.3 mg/dL (ref 1.7–2.4)

## 2020-05-17 LAB — OSMOLALITY, URINE: Osmolality, Ur: 645 mOsm/kg (ref 300–900)

## 2020-05-17 NOTE — Progress Notes (Signed)
PROGRESS NOTE    [name] [name]  WPY:[id number] DOB: 1960-09-20 DOA: 05/12/2020 PCP: [name], NP   Chief Complaint  Patient presents with  . Fall    Brief Narrative:  59 year old male with history of hypertension, diabetes mellitus type 2, GERD/hiatal hernia, depression, COPD, alcohol abuse, hepatitis C status post treatment/?  Cirrhosis of liver presented with altered mental status and fall.  Patient was confused on presentation.  Per EMS, mother was concerned for alcohol withdrawal; reported last drink on Thursday.  On presentation, WBCs was 12, sodium of 124, potassium 3.6 with elevated AST/ALT/total bili with ammonia 50.  Chest x-ray showed right hilar opacity/fullness.  Head CT was showing moderate to severe left maxillary sinusitis.  Covid test was negative.  He was started on IV fluids and antibiotics.   Assessment & Plan:   Active Problems:   Hepatic cirrhosis (HCC)   Type 2 diabetes mellitus with hyperlipidemia (HCC)   ETOH abuse   Essential hypertension   DTs (delirium tremens) ([place])   Hyponatremia   Hypokalemia   PNA (pneumonia)   SIRS (systemic inflammatory response syndrome) (HCC)   Pneumonia   Community acquired pneumonia of right lower lobe of lung   Elevated LFTs   Hypophosphatemia   Acute metabolic encephalopathy   Acute maxillary sinusitis   Depression   Physical deconditioning  1 right lower lobe community-acquired pneumonia On presentation chest x-ray consistent with right hilar opacity/fullness.  CT abdomen showed a right lower lobe infiltrate.  Patient pancultured.  COVID-19 PCR negative.  Clinical improvement.  Patient with sats of 95% on room air.  Status post 5 days azithromycin.  IV Unasyn has been transitioned to Augmentin to complete a 7 to 10-day course of antibiotic treatment.  Supportive care.     2.  Acute metabolic encephalopathy Likely secondary to alcohol dependence/withdrawal versus hepatic encephalopathy as patient noted to have  ammonia level of 50 versus secondary to pneumonia.  Head CT done negative.  Clinical improvement.  Right upper quadrant ultrasound with hepatic steatosis.  Continue antibiotics, Ativan withdrawal protocol, thiamine, multivitamin.  Patient was started on lactulose which has been discontinued.  Supportive care.   3.  Hyponatremia Likely secondary to poor oral intake versus SIADH.  Sodium levels fluctuated and trended back down to 127.  Urine electrolytes, urine osmolality, serum osmolality pending.  Uric acid level ordered and at 1.1.  Saline lock IV fluids.  Patient with urine output of 2.760 L over the past 24 hours.  Continue fluid restriction at 1500 cc/day.  Repeat labs in the morning.  May need a dose of Lasix 20 mg IV x1 tomorrow if no improvement with hyponatremia.  Follow.    4.  Hypokalemia/hypophosphatemia Potassium at 3.8.  Phosphorus at 2.9.  Follow.   5.  Alcohol abuse/alcohol withdrawal Currently stable.  Continue the Ativan withdrawal protocol, multivitamin, thiamine, folate.  Social work consulted.  Patient interested in alcohol cessation at this time.  6.  Transaminitis/probable alcoholic hepatitis Right upper quadrant ultrasound with hepatic steatosis with no findings of cirrhosis.  LFTs trending down.  Outpatient follow-up.  7.  Maxillary sinusitis Was on IV Unasyn currently on Augmentin.   8.  Hypertension Continue current regimen of losartan and amlodipine.  Stable.   9.  Well-controlled diabetes mellitus type 2 Hemoglobin A1c 6.1.  CBG of 192 this morning.  Continue to hold oral hypoglycemic agents and Invokana.  Continue sliding scale insulin.   10.  Depression Continue Cymbalta.   11.  Generalized  deconditioning PT/OT.  SNF recommended.  Social work consulted.   DVT prophylaxis: Heparin Code Status: Full Family Communication: Updated patient.  No family at bedside.  Disposition:   Status is: Inpatient    Dispo: The patient is from: Home               Anticipated d/c is to: SNF              Anticipated d/c date is: Hopefully tomorrow.              Patient currently on antibiotics for pneumonia, Ativan withdrawal protocol.         Consultants:   None  Procedures:   CT head without contrast 05/12/2020  CT abdomen and pelvis 05/13/2020  Chest x-ray 05/12/2020  Abdominal ultrasound 05/13/2020  Antimicrobials:  IV Unasyn 05/13/2020>>>> 05/16/2020  IV azithromycin 05/12/2020>>>>05/16/2020  IV Rocephin 9/12/ 2021x1 dose  Augmentin 05/16/2020>>>>   Subjective: Patient sitting up in chair.  Denies any chest pain.  No shortness of breath.  Feeling better.  Asking whether pneumonia has cleared up.  Stated had significant weakness from transferring from bed to chair this morning.   Objective: Vitals:   05/16/20 2056 05/17/20 0545 05/17/20 0548 05/17/20 0952  BP: 129/79 (!) 157/85  128/77  Pulse: 90 83  82  Resp: (!) 24 16    Temp: 99.1 F (37.3 C) 97.9 F (36.6 C)    TempSrc: Oral Oral    SpO2: 95% 95%  95%  Weight:   77.9 kg   Height:        Intake/Output Summary (Last 24 hours) at 05/17/2020 1232 Last data filed at 05/17/2020 1203 Gross per 24 hour  Intake 120 ml  Output 1810 ml  Net -1690 ml   Filed Weights   05/12/20 1900 05/14/20 1542 05/17/20 0548  Weight: 81.2 kg 79.4 kg 77.9 kg    Examination:  General exam: NAD Respiratory system: Coarse breath sounds in the right base.  No wheezing.  Fair air movement.  Speaking in full sentences. Cardiovascular system: RRR no murmurs rubs or gallops.  No JVD.  No lower extremity edema.  Gastrointestinal system: Abdomen is soft, nontender, nondistended, positive bowel sounds.  No rebound.  No guarding.  Central nervous system: Alert and oriented. No focal neurological deficits. Extremities: Symmetric 5 x 5 power. Skin: No rashes, lesions or ulcers Psychiatry: Judgement and insight appear normal. Mood & affect appropriate.     Data Reviewed: I have personally reviewed  following labs and imaging studies  CBC: Recent Labs  Lab 05/12/20 2120 05/13/20 0512 05/14/20 0420 05/15/20 0520 05/16/20 0556  WBC 11.7* 9.7 8.3 7.6 8.2  NEUTROABS 10.5*  --   --   --  5.9  HGB 12.5* 11.6* 11.2* 12.0* 12.5*  HCT 35.3* 32.4* 32.8* 35.3* 36.5*  MCV 88.9 88.5 91.6 93.9 93.1  PLT 147* 144* 189 219 175    Basic Metabolic Panel: Recent Labs  Lab 05/13/20 0511 05/13/20 0512 05/14/20 0420 05/15/20 0520 05/16/20 0556 05/16/20 1331 05/17/20 0549  NA  --    < > 126* 130* 126* 131* 127*  K  --    < > 3.2* 3.8 4.3 4.0 3.8  CL  --    < > 93* 96* 97* 96* 99  CO2  --    < > 25 24 22 23  20*  GLUCOSE  --    < > 112* 126* 131* 133* 137*  BUN  --    < >  13 9 7 10 11   CREATININE  --    < > 0.37* 0.35* 0.35* 0.40* 0.35*  CALCIUM  --    < > 7.5* 7.7* 7.8* 8.5* 7.7*  MG 2.3  --  2.3 2.1 2.2  --  2.3  PHOS 1.3*  --   --  1.3* 2.2*  --  2.9   < > = values in this interval not displayed.    GFR: Estimated Creatinine Clearance: 108.5 mL/min (A) (by C-G formula based on SCr of 0.35 mg/dL (L)).  Liver Function Tests: Recent Labs  Lab 05/13/20 0512 05/14/20 0420 05/15/20 0520 05/16/20 0556 05/17/20 0549  AST 339* 269* 195* 161* 159*  ALT 118* 104* 90* 86* 101*  ALKPHOS 61 67 87 99 101  BILITOT 1.8* 1.7* 1.6* 1.7* 1.9*  PROT 6.1* 5.9* 6.0* 6.5 6.5  ALBUMIN 2.5* 2.3* 2.3* 2.5* 2.5*    CBG: Recent Labs  Lab 05/16/20 1212 05/16/20 1646 05/16/20 2057 05/17/20 0759 05/17/20 1144  GLUCAP 133* 173* 133* 192* 124*     Recent Results (from the past 240 hour(s))  Blood culture (routine single)     Status: None (Preliminary result)   Collection Time: 05/12/20  9:20 PM   Specimen: BLOOD LEFT FOREARM  Result Value Ref Range Status   Specimen Description   Final    BLOOD LEFT FOREARM Performed at [facility], [place] [address], [place], [place] [address]    Special Requests   Final    BOTTLES DRAWN AEROBIC AND ANAEROBIC Blood Culture adequate  volume Performed at [facility] [address], [place], [place] [address]    Culture   Final    NO GROWTH 4 DAYS Performed at [facility], [place] [address], [place], [place] [address]    Report Status PENDING  Incomplete  SARS Coronavirus 2 by RT PCR (hospital order, performed in [facility] hospital lab) Nasopharyngeal Nasopharyngeal Swab     Status: None   Collection Time: 05/12/20  9:20 PM   Specimen: Nasopharyngeal Swab  Result Value Ref Range Status   SARS Coronavirus 2 NEGATIVE NEGATIVE Final    Comment: (NOTE) SARS-CoV-2 target nucleic acids are NOT DETECTED.  The SARS-CoV-2 RNA is generally detectable in upper and lower respiratory specimens during the acute phase of infection. The lowest concentration of SARS-CoV-2 viral copies this assay can detect is 250 copies / mL. A negative result does not preclude SARS-CoV-2 infection and should not be used as the sole basis for treatment or other patient management decisions.  A negative result may occur with improper specimen collection / handling, submission of specimen other than nasopharyngeal swab, presence of viral mutation(s) within the areas targeted by this assay, and inadequate number of viral copies (<250 copies / mL). A negative result must be combined with clinical observations, patient history, and epidemiological information.  Fact Sheet for Patients:   StrictlyIdeas.no  Fact Sheet for Healthcare Providers: BankingDealers.co.za  This test is not yet approved or  cleared by the [place] FDA and has been authorized for detection and/or diagnosis of SARS-CoV-2 by FDA under an Emergency Use Authorization (EUA).  This EUA will remain in effect (meaning this test can be used) for the duration of the COVID-19 declaration under Section 564(b)(1) of the Act, 21 U.S.C. section 360bbb-3(b)(1), unless the authorization is terminated or revoked  sooner.  Performed at [facility], [place] [address], [place], [place] [address]   Culture, blood (single)  Status: None (Preliminary result)   Collection Time: 05/12/20  9:20 PM   Specimen: BLOOD LEFT HAND  Result Value Ref Range Status   Specimen Description   Final    BLOOD LEFT HAND Performed at [facility] [address] [place], [place] [address]    Special Requests   Final    BOTTLES DRAWN AEROBIC AND ANAEROBIC Blood Culture adequate volume Performed at [facility] [address], [place], [place] [address]    Culture   Final    NO GROWTH 4 DAYS Performed at [facility], [place] [address] [place], [place] [address]    Report Status PENDING  Incomplete  Urine culture     Status: Abnormal   Collection Time: 05/13/20  9:09 AM   Specimen: In/Out Cath Urine  Result Value Ref Range Status   Specimen Description   Final    IN/OUT CATH URINE Performed at [facility] [address], [place], [place] [address]    Special Requests   Final    NONE Performed at [facility], [place] [address] [place], [place] [address]    Culture 2,000 COLONIES/mL STAPHYLOCOCCUS HAEMOLYTICUS (A)  Final   Report Status 05/15/2020 FINAL  Final   Organism ID, Bacteria STAPHYLOCOCCUS HAEMOLYTICUS (A)  Final      Susceptibility   Staphylococcus haemolyticus - MIC*    CIPROFLOXACIN <=0.5 SENSITIVE Sensitive     GENTAMICIN 2 SENSITIVE Sensitive     NITROFURANTOIN <=16 SENSITIVE Sensitive     OXACILLIN >=4 RESISTANT Resistant     TETRACYCLINE 4 SENSITIVE Sensitive     VANCOMYCIN 2 SENSITIVE Sensitive     TRIMETH/SULFA 160 RESISTANT Resistant     CLINDAMYCIN >=8 RESISTANT Resistant     RIFAMPIN <=0.5 SENSITIVE Sensitive     Inducible Clindamycin NEGATIVE Sensitive     * 2,000 COLONIES/mL STAPHYLOCOCCUS HAEMOLYTICUS         Radiology Studies: No results found.      Scheduled  Meds: . amLODipine  10 mg Oral Daily  . amoxicillin-clavulanate  1 tablet Oral Q12H  . aspirin EC  81 mg Oral Daily  . atorvastatin  40 mg Oral Daily  . DULoxetine  30 mg Oral Daily  . enoxaparin (LOVENOX) injection  40 mg Subcutaneous Q24H  . folic acid  1 mg Oral Daily  . insulin aspart  0-15 Units Subcutaneous TID WC  . loratadine  10 mg Oral Daily  . losartan  100 mg Oral Daily  . multivitamin with minerals  1 tablet Oral Daily  . pantoprazole  40 mg Oral BID  . thiamine  100 mg Oral Daily   Or  . thiamine  100 mg Intravenous Daily   Continuous Infusions:    LOS: 4 days    Time spent: 35 minutes    Irine Seal, MD [facility]   To contact the attending provider between 7A-7P or the covering provider during after hours 7P-7A, please log into the web site [email or web address] and access using universal [place] password for that web site. If you do not have the password, please call the hospital operator.  05/17/2020, 12:32 PM

## 2020-05-17 NOTE — NC FL2 (Signed)
[place] LEVEL OF CARE SCREENING TOOL     IDENTIFICATION  Patient Name: [name] [name] Birthdate: 08/29/1961 Sex: male Admission Date (Current Location): 05/12/2020  [facility] and [place] Number:  Herbalist and Address:  [facility],  [place], [place]      Provider Number: [id number]  Attending Physician Name and Address:  Eugenie Filler, MD  Relative Name and Phone Number:  sister Peggye Fothergill [phone]    Current Level of Care: Hospital Recommended Level of Care: [place] Prior Approval Number:    Date Approved/Denied:   PASRR Number: pending  Discharge Plan: SNF    Current Diagnoses: Patient Active Problem List   Diagnosis Date Noted   Community acquired pneumonia of right lower lobe of lung    Elevated LFTs    Hypophosphatemia    Acute metabolic encephalopathy    Acute maxillary sinusitis    Depression    Physical deconditioning    Pneumonia 05/14/2020   DTs (delirium tremens) ([place]) 05/13/2020   Hyponatremia 05/13/2020   Hypokalemia 05/13/2020   PNA (pneumonia) 05/13/2020   SIRS (systemic inflammatory response syndrome) ([place]) 05/13/2020   Sepsis with acute hypoxic respiratory failure without septic shock (HCC)    Alcohol abuse with alcohol-induced mood disorder ([place]) 12/17/2017   Hereditary hemochromatosis ([place]) 11/23/2017   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   Essential hypertension 04/02/2017   Hypogonadism male 08/13/2016   ETOH abuse 06/22/2016   Low serum testosterone level 01/08/2016   Type 2 diabetes mellitus with hyperlipidemia ([place]) 01/02/2016   Erectile dysfunction 09/12/2015   Hepatic cirrhosis ([place]) 01/10/2015   Chronic hepatitis C without hepatic coma ([place]) 10/31/2014   Tobacco use disorder 08/03/2013    Orientation RESPIRATION BLADDER Height & Weight     Self, Place, Situation, Time  Normal Incontinent Weight: 77.9  kg Height:  5\' 11"  (180.3 cm)  BEHAVIORAL SYMPTOMS/MOOD NEUROLOGICAL BOWEL NUTRITION STATUS      Continent Diet (Carb mod, 1500cc fluid restriction)  AMBULATORY STATUS COMMUNICATION OF NEEDS Skin   Limited Assist Verbally Bruising                       Personal Care Assistance Level of Assistance  Dressing, Bathing Bathing Assistance: Limited assistance   Dressing Assistance: Limited assistance     Functional Limitations Info  Sight, Hearing Sight Info: Adequate Hearing Info: Adequate      SPECIAL CARE FACTORS FREQUENCY  PT (By licensed PT), OT (By licensed OT)     PT Frequency: 5 x weekly OT Frequency: 5 x weekly            Contractures Contractures Info: Not present    Additional Factors Info  Code Status, Allergies Code Status Info: Full Allergies Info: Bee venom, lactose           Current Medications (05/17/2020):  This is the current hospital active medication list Current Facility-Administered Medications  Medication Dose Route Frequency Provider Last Rate Last Admin   acetaminophen (TYLENOL) tablet 650 mg  650 mg Oral Q6H PRN Mujtaba, Mohammadtokir, MD   650 mg at 05/13/20 1234   Or   acetaminophen (TYLENOL) suppository 650 mg  650 mg Rectal Q6H PRN Mujtaba, Mohammadtokir, MD       amLODipine (NORVASC) tablet 10 mg  10 mg Oral Daily Mujtaba, Mohammadtokir, MD   10 mg at 05/17/20 0954   amoxicillin-clavulanate (AUGMENTIN) 875-125 MG per tablet 1 tablet  1  tablet Oral Q12H Eugenie Filler, MD   1 tablet at 05/17/20 8657   aspirin EC tablet 81 mg  81 mg Oral Daily Mujtaba, Mohammadtokir, MD   81 mg at 05/17/20 0953   atorvastatin (LIPITOR) tablet 40 mg  40 mg Oral Daily Mujtaba, Mohammadtokir, MD   40 mg at 05/17/20 0955   DULoxetine (CYMBALTA) DR capsule 30 mg  30 mg Oral Daily Mujtaba, Mohammadtokir, MD   30 mg at 05/17/20 0955   enoxaparin (LOVENOX) injection 40 mg  40 mg Subcutaneous Q24H Eugenie Filler, MD   40 mg at 84/69/62 9528    folic acid (FOLVITE) tablet 1 mg  1 mg Oral Daily Mujtaba, Mohammadtokir, MD   1 mg at 05/17/20 0953   insulin aspart (novoLOG) injection 0-15 Units  0-15 Units Subcutaneous TID WC Mujtaba, Mohammadtokir, MD   3 Units at 05/17/20 0810   loratadine (CLARITIN) tablet 10 mg  10 mg Oral Daily Mujtaba, Mohammadtokir, MD   10 mg at 05/17/20 0954   losartan (COZAAR) tablet 100 mg  100 mg Oral Daily Mujtaba, Mohammadtokir, MD   100 mg at 05/17/20 0954   multivitamin with minerals tablet 1 tablet  1 tablet Oral Daily Mujtaba, Mohammadtokir, MD   1 tablet at 05/17/20 0954   ondansetron (ZOFRAN) tablet 4 mg  4 mg Oral Q6H PRN Mujtaba, Mohammadtokir, MD       Or   ondansetron (ZOFRAN) injection 4 mg  4 mg Intravenous Q6H PRN Mujtaba, Mohammadtokir, MD       pantoprazole (PROTONIX) EC tablet 40 mg  40 mg Oral BID Eugenie Filler, MD   40 mg at 05/17/20 0954   senna-docusate (Senokot-S) tablet 1 tablet  1 tablet Oral QHS PRN Mujtaba, Mohammadtokir, MD       thiamine tablet 100 mg  100 mg Oral Daily Mujtaba, Mohammadtokir, MD   100 mg at 05/17/20 4132   Or   thiamine (B-1) injection 100 mg  100 mg Intravenous Daily Mujtaba, Mohammadtokir, MD   100 mg at 05/13/20 4401     Discharge Medications: Please see discharge summary for a list of discharge medications.  Relevant Imaging Results:  Relevant Lab Results:   Additional Information [id number]  Emmette Katt, Marjie Skiff, RN

## 2020-05-17 NOTE — TOC Initial Note (Signed)
Transition of Care [facility]) - Initial/Assessment Note    Patient Details  Name: [name] [name] MRN: [id number] Date of Birth: February 19, 1961  [facility] CM/SW Contact:    Lynnell Catalan, RN Phone Number: 05/17/2020, 3:48 PM  Clinical Narrative:                 Pt from home with mother. Mother is moving into [place] soon and planning to sell her home. Mother states that pt cannot come back and live with her. This CM spoke with sister Peggye Fothergill as well about dc plan. Pt does not have insurance and if needed to go to snf would be private pay. Jodie states that the family has funds and should be able to help him with the payment for snf. Pt was faxed out and has agreed to go to SNF. Jodie informed that pt would either need to go to snf private pay or to someone's home with 24hr supervision. Pt does need ETOH rehab once he is able to ambulate on his own. [facility] is only facility that has expressed interest in pt. Liaison Juliann Pulse is to reach out to pt family to talk to them about payment options. TOC will continue to follow.  Expected Discharge Plan: Skilled Nursing Facility Barriers to Discharge: Continued Medical Work up  Expected Discharge Plan and Services Expected Discharge Plan: [place]       Living arrangements for the past 2 months: [place]                  Prior Living Arrangements/Services Living arrangements for the past 2 months: Single Family Home Lives with:: Parents                   Activities of Daily Living Home Assistive Devices/Equipment: Eyeglasses, CBG Meter ADL Screening (condition at time of admission) Patient's cognitive ability adequate to safely complete daily activities?: Yes Is the patient deaf or have difficulty hearing?: No Does the patient have difficulty seeing, even when wearing glasses/contacts?: No Does the patient have difficulty concentrating, remembering, or making decisions?: Yes Patient  able to express need for assistance with ADLs?: Yes Does the patient have difficulty dressing or bathing?: No Independently performs ADLs?: No Communication: Independent Dressing (OT): Independent Grooming: Independent Feeding: Independent Bathing: Independent Toileting: Dependent Is this a change from baseline?: Change from baseline, expected to last >3days In/Out Bed: Needs assistance, Dependent Is this a change from baseline?: Change from baseline, expected to last >3 days Walks in Home: Needs assistance Is this a change from baseline?: Change from baseline, expected to last >3 days Does the patient have difficulty walking or climbing stairs?: Yes (secondary to weakness) Weakness of Legs: Both Weakness of Arms/Hands: None  Permission Sought/Granted                  Emotional Assessment              Admission diagnosis:  Hypokalemia [E87.6] Pneumonia [J18.9] DTs (delirium tremens) ([place]) [F10.231] Elevated LFTs [R79.89] Community acquired pneumonia of right lower lobe of lung [J18.9] Sepsis with acute hypoxic respiratory failure without septic shock, due to unspecified organism ([place]) [A41.9, R65.20, J96.01] Patient Active Problem List   Diagnosis Date Noted  . Community acquired pneumonia of right lower lobe of lung   . Elevated LFTs   . Hypophosphatemia   . Acute metabolic encephalopathy   . Acute maxillary sinusitis   . Depression   . Physical  deconditioning   . Pneumonia 05/14/2020  . DTs (delirium tremens) ([place]) 05/13/2020  . Hyponatremia 05/13/2020  . Hypokalemia 05/13/2020  . PNA (pneumonia) 05/13/2020  . SIRS (systemic inflammatory response syndrome) ([place]) 05/13/2020  . Sepsis with acute hypoxic respiratory failure without septic shock ([place])   . Alcohol abuse with alcohol-induced mood disorder ([place]) 12/17/2017  . Hereditary hemochromatosis ([place]) 11/23/2017  . 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 Essential hypertension 04/02/2017   . Hypogonadism male 08/13/2016  . ETOH abuse 06/22/2016  . Low serum testosterone level 01/08/2016  . Type 2 diabetes mellitus with hyperlipidemia ([place]) 01/02/2016  . Erectile dysfunction 09/12/2015  . Hepatic cirrhosis ([place]) 01/10/2015  . Chronic hepatitis C without hepatic coma ([place]) 10/31/2014  . Tobacco use disorder 08/03/2013   PCP:  [name], NP Pharmacy:   [facility], [place] [address] [place] [address] Phone: [phone] Fax: [phone]     Social Determinants of Health (SDOH) Interventions    Readmission Risk Interventions No flowsheet data found.

## 2020-05-17 NOTE — Progress Notes (Signed)
Transition of Care (TOC) -30 day Note       Patient Details  Name: [name] [name] MRN: [id number] Date of Birth: 1960/09/14    MUST ID: [id number]   To Whom it May Concern:   Please be advised that the above patient will require a short-term nursing home stay, anticipated 30 days or less rehabilitation and strengthening. The plan is for return home.

## 2020-05-17 NOTE — Progress Notes (Signed)
Physical Therapy Treatment Patient Details Name: [name] [name] MRN: [id number] DOB: 19-Sep-1960 Today's Date: 05/17/2020    History of Present Illness 59 year old male with history of hypertension, diabetes mellitus type 2, GERD/hiatal hernia, depression, COPD, alcohol abuse, hepatitis C, cirrhosis of liver. Pt presented with altered mental status and fall. Patient was confused on presentation.  Per EMS, mother was concerned for alcohol withdrawal.    PT Comments    Assisted OOB to amb in hallway.  General Gait Details: cues for posture and position from RW; physical assist for balance and RW management.  Too unsteady to walk without walker.    Follow Up Recommendations  SNF;Supervision/Assistance - 24 hour     Equipment Recommendations       Recommendations for Other Services       Precautions / Restrictions Precautions Precautions: Fall    Mobility  Bed Mobility Overal bed mobility: Modified Independent             General bed mobility comments: self able with increased time  Transfers Overall transfer level: Needs assistance Equipment used: Rolling walker (2 wheeled) Transfers: Sit to/from [facility] Sit to Stand: Min assist Stand pivot transfers: Min assist       General transfer comment: min assist to bring wt up and fwd and to balance in standing   (still unsteady)  Ambulation/Gait Ambulation/Gait assistance: Min guard;Min assist Gait Distance (Feet): 125 Feet Assistive device: Rolling walker (2 wheeled) Gait Pattern/deviations: Step-through pattern;Decreased step length - right;Decreased step length - left;Decreased stride length;Shuffle;Narrow base of support Gait velocity: decreased   General Gait Details: cues for posture and position from RW; physical assist for balance and RW management.  Too unsteady to walk without walker   Stairs             Wheelchair Mobility    Modified Rankin (Stroke Patients Only)        Balance                                            Cognition Arousal/Alertness: Awake/alert Behavior During Therapy: WFL for tasks assessed/performed Overall Cognitive Status: Within Functional Limits for tasks assessed                                 General Comments: AxO x 3 shared some of his life.  Was working as an Clinical biochemist.  Married once now divirced.  Moved in with his mom who also walks on a walker.      Exercises      General Comments        Pertinent Vitals/Pain      Home Living                      Prior Function            PT Goals (current goals can now be found in the care plan section) Progress towards PT goals: Progressing toward goals    Frequency    Min 3X/week      PT Plan Current plan remains appropriate    Co-evaluation              AM-PAC PT "6 Clicks" Mobility   Outcome Measure  Help needed turning from your back to your side while in a flat bed  without using bedrails?: None Help needed moving from lying on your back to sitting on the side of a flat bed without using bedrails?: None Help needed moving to and from a bed to a chair (including a wheelchair)?: A Little Help needed standing up from a chair using your arms (e.g., wheelchair or bedside chair)?: A Little Help needed to walk in hospital room?: A Little Help needed climbing 3-5 steps with a railing? : A Lot 6 Click Score: 19    End of Session Equipment Utilized During Treatment: Gait belt Activity Tolerance: Patient tolerated treatment well Patient left: in bed;with call bell/phone within reach Nurse Communication: Mobility status PT Visit Diagnosis: Difficulty in walking, not elsewhere classified (R26.2);Muscle weakness (generalized) (M62.81);Unsteadiness on feet (R26.81);History of falling (Z91.81)     Time: 5883-2549 PT Time Calculation (min) (ACUTE ONLY): 16 min  Charges:  $Gait Training: 8-22 mins                      Rica Koyanagi  PTA Acute  Rehabilitation Services Pager      [phone] Office      [phone]

## 2020-05-18 LAB — CULTURE, BLOOD (SINGLE)
Culture: NO GROWTH
Culture: NO GROWTH
Special Requests: ADEQUATE
Special Requests: ADEQUATE

## 2020-05-18 LAB — COMPREHENSIVE METABOLIC PANEL
ALT: 154 U/L — ABNORMAL HIGH (ref 0–44)
AST: 204 U/L — ABNORMAL HIGH (ref 15–41)
Albumin: 2.8 g/dL — ABNORMAL LOW (ref 3.5–5.0)
Alkaline Phosphatase: 111 U/L (ref 38–126)
Anion gap: 9 (ref 5–15)
BUN: 13 mg/dL (ref 6–20)
CO2: 23 mmol/L (ref 22–32)
Calcium: 8.3 mg/dL — ABNORMAL LOW (ref 8.9–10.3)
Chloride: 101 mmol/L (ref 98–111)
Creatinine, Ser: 0.52 mg/dL — ABNORMAL LOW (ref 0.61–1.24)
GFR calc Af Amer: >60 mL/min (ref >60)
GFR calc non Af Amer: >60 mL/min (ref >60)
Glucose, Bld: 167 mg/dL — ABNORMAL HIGH (ref 70–99)
Potassium: 3.8 mmol/L (ref 3.5–5.1)
Sodium: 133 mmol/L — ABNORMAL LOW (ref 135–145)
Total Bilirubin: 1.8 mg/dL — ABNORMAL HIGH (ref 0.3–1.2)
Total Protein: 7 g/dL (ref 6.5–8.1)

## 2020-05-18 LAB — GLUCOSE, CAPILLARY
Glucose-Capillary: 169 mg/dL — ABNORMAL HIGH (ref 70–99)
Glucose-Capillary: 179 mg/dL — ABNORMAL HIGH (ref 70–99)
Glucose-Capillary: 192 mg/dL — ABNORMAL HIGH (ref 70–99)
Glucose-Capillary: 189 mg/dL — ABNORMAL HIGH (ref 70–99)

## 2020-05-18 LAB — HEMOGLOBIN AND HEMATOCRIT, BLOOD
HCT: 38.9 % — ABNORMAL LOW (ref 39.0–52.0)
Hemoglobin: 13 g/dL (ref 13.0–17.0)

## 2020-05-18 LAB — PHOSPHORUS: Phosphorus: 2.6 mg/dL (ref 2.5–4.6)

## 2020-05-18 LAB — MAGNESIUM: Magnesium: 2.4 mg/dL (ref 1.7–2.4)

## 2020-05-18 NOTE — Progress Notes (Signed)
PROGRESS NOTE    [name] [name]  HMC:[id number] DOB: Jan 25, 1961 DOA: 05/12/2020 PCP: [name], NP   Chief Complaint  Patient presents with   Fall    Brief Narrative:  59 year old male with history of hypertension, diabetes mellitus type 2, GERD/hiatal hernia, depression, COPD, alcohol abuse, hepatitis C status post treatment/?  Cirrhosis of liver presented with altered mental status and fall.  Patient was confused on presentation.  Per EMS, mother was concerned for alcohol withdrawal; reported last drink on Thursday.  On presentation, WBCs was 12, sodium of 124, potassium 3.6 with elevated AST/ALT/total bili with ammonia 50.  Chest x-ray showed right hilar opacity/fullness.  Head CT was showing moderate to severe left maxillary sinusitis.  Covid test was negative.  He was started on IV fluids and antibiotics.   Assessment & Plan:   Active Problems:   Hepatic cirrhosis (HCC)   Type 2 diabetes mellitus with hyperlipidemia (HCC)   ETOH abuse   Essential hypertension   DTs (delirium tremens) ([place])   Hyponatremia   Hypokalemia   PNA (pneumonia)   SIRS (systemic inflammatory response syndrome) (HCC)   Pneumonia   Community acquired pneumonia of right lower lobe of lung   Elevated LFTs   Hypophosphatemia   Acute metabolic encephalopathy   Acute maxillary sinusitis   Depression   Physical deconditioning  1 right lower lobe community-acquired pneumonia On presentation chest x-ray consistent with right hilar opacity/fullness.  CT abdomen showed a right lower lobe infiltrate.  Patient pancultured.  COVID-19 PCR negative.  Clinical improvement.  Patient with sats of 94% on room air.  Status post 5 days azithromycin.  IV Unasyn has been transitioned to Augmentin to complete a 7 to 10-day course of antibiotic treatment.  Supportive care.     2.  Acute metabolic encephalopathy Likely secondary to alcohol dependence/withdrawal versus hepatic encephalopathy as patient noted to have  ammonia level of 50 versus secondary to pneumonia.  Head CT done negative.  Clinical improvement.  Right upper quadrant ultrasound with hepatic steatosis.  Continue antibiotics, Ativan withdrawal protocol, thiamine, multivitamin.  Patient was started on lactulose which has been discontinued.  Supportive care.   3.  Hyponatremia Likely secondary to poor oral intake versus SIADH.  Sodium levels fluctuated and currently at 133.  Urine sodium 154, urine creatinine 73.42, urine osmolality 645.  Serum osmolality 270.  Uric acid level ordered and at 1.1.  Saline lock IV fluids.  Patient with urine output of 1.5 L over the past 24 hours.  Continue fluid restriction at 1500 cc/day.  Repeat labs in the morning.  Follow.   4.  Hypokalemia/hypophosphatemia Repleted.  Potassium at 3.8.  Magnesium at 2.4.  Phosphorus at 2.6.  Follow.   5.  Alcohol abuse/alcohol withdrawal Currently stable.  Continue the Ativan withdrawal protocol, multivitamin, thiamine, folate.  Social work consulted.  Patient interested in alcohol cessation at this time.  Outpatient follow-up.  6.  Transaminitis/probable alcoholic hepatitis Right upper quadrant ultrasound with hepatic steatosis with no findings of cirrhosis.  LFTs fluctuating.  Outpatient follow-up.   7.  Maxillary sinusitis Continue Augmentin.   8.  Hypertension Continue amlodipine and losartan.  Stable.  9.  Well-controlled diabetes mellitus type 2 Hemoglobin A1c 6.1.  CBG 169 this morning.  Continue to hold oral hypoglycemic agents and Invokana.  Sliding scale insulin.  10.  Depression Cymbalta.   11.  Generalized deconditioning PT/OT.  SNF recommended.  Social work consulted and awaiting SNF placement.   DVT prophylaxis: Heparin  Code Status: Full Family Communication: Updated patient.  No family at bedside.  Disposition:   Status is: Inpatient    Dispo: The patient is from: Home              Anticipated d/c is to: Awaiting SNF               Anticipated d/c date is: Awaiting SNF placement              Patient currently on antibiotics for pneumonia.  Awaiting SNF placement.       Consultants:   None  Procedures:   CT head without contrast 05/12/2020  CT abdomen and pelvis 05/13/2020  Chest x-ray 05/12/2020  Abdominal ultrasound 05/13/2020  Antimicrobials:  IV Unasyn 05/13/2020>>>> 05/16/2020  IV azithromycin 05/12/2020>>>>05/16/2020  IV Rocephin 9/12/ 2021x1 dose  Augmentin 05/16/2020>>>>   Subjective: Patient sitting up in bed.  Feeling better.  Denies any chest pain or shortness of breath.  Awaiting placement.   Objective: Vitals:   05/17/20 0952 05/17/20 1432 05/17/20 2145 05/18/20 0547  BP: 128/77 134/80 134/68 139/86  Pulse: 82 82 86 86  Resp:  19 14 14   Temp:  98.2 F (36.8 C) 98 F (36.7 C) 97.6 F (36.4 C)  TempSrc:  Oral Oral Oral  SpO2: 95% 94% 96% 93%  Weight:      Height:        Intake/Output Summary (Last 24 hours) at 05/18/2020 1132 Last data filed at 05/18/2020 0900 Gross per 24 hour  Intake 427 ml  Output 1500 ml  Net -1073 ml   Filed Weights   05/12/20 1900 05/14/20 1542 05/17/20 0548  Weight: 81.2 kg 79.4 kg 77.9 kg    Examination:  General exam: NAD Respiratory system: Some coarse breath sounds in the right base.  No wheezing.  Fair air movement.  Speaking in full sentences. Cardiovascular system: Regular rate rhythm no murmurs rubs or gallops.  No JVD.  No lower extremity edema. Gastrointestinal system: Abdomen is soft, nontender, nondistended, positive bowel sounds.  No rebound.  No guarding.  Central nervous system: Alert and oriented. No focal neurological deficits. Extremities: Symmetric 5 x 5 power. Skin: No rashes, lesions or ulcers Psychiatry: Judgement and insight appear normal. Mood & affect appropriate.     Data Reviewed: I have personally reviewed following labs and imaging studies  CBC: Recent Labs  Lab 05/12/20 2120 05/12/20 2120 05/13/20 0512  05/14/20 0420 05/15/20 0520 05/16/20 0556 05/18/20 0618  WBC 11.7*  --  9.7 8.3 7.6 8.2  --   NEUTROABS 10.5*  --   --   --   --  5.9  --   HGB 12.5*   < > 11.6* 11.2* 12.0* 12.5* 13.0  HCT 35.3*   < > 32.4* 32.8* 35.3* 36.5* 38.9*  MCV 88.9  --  88.5 91.6 93.9 93.1  --   PLT 147*  --  144* 189 219 303  --    < > = values in this interval not displayed.    Basic Metabolic Panel: Recent Labs  Lab 05/13/20 0511 05/13/20 0512 05/14/20 0420 05/14/20 0420 05/15/20 0520 05/16/20 0556 05/16/20 1331 05/17/20 0549 05/18/20 0618  NA  --    < > 126*   < > 130* 126* 131* 127* 133*  K  --    < > 3.2*   < > 3.8 4.3 4.0 3.8 3.8  CL  --    < > 93*   < > 96* 97* 96*  99 101  CO2  --    < > 25   < > 24 22 23  20* 23  GLUCOSE  --    < > 112*   < > 126* 131* 133* 137* 167*  BUN  --    < > 13   < > 9 7 10 11 13   CREATININE  --    < > 0.37*   < > 0.35* 0.35* 0.40* 0.35* 0.52*  CALCIUM  --    < > 7.5*   < > 7.7* 7.8* 8.5* 7.7* 8.3*  MG 2.3   < > 2.3  --  2.1 2.2  --  2.3 2.4  PHOS 1.3*  --   --   --  1.3* 2.2*  --  2.9 2.6   < > = values in this interval not displayed.    GFR: Estimated Creatinine Clearance: 108.5 mL/min (A) (by C-G formula based on SCr of 0.52 mg/dL (L)).  Liver Function Tests: Recent Labs  Lab 05/14/20 0420 05/15/20 0520 05/16/20 0556 05/17/20 0549 05/18/20 0618  AST 269* 195* 161* 159* 204*  ALT 104* 90* 86* 101* 154*  ALKPHOS 67 87 99 101 111  BILITOT 1.7* 1.6* 1.7* 1.9* 1.8*  PROT 5.9* 6.0* 6.5 6.5 7.0  ALBUMIN 2.3* 2.3* 2.5* 2.5* 2.8*    CBG: Recent Labs  Lab 05/17/20 0759 05/17/20 1144 05/17/20 1656 05/17/20 2038 05/18/20 0734  GLUCAP 192* 124* 207* 131* 169*     Recent Results (from the past 240 hour(s))  Blood culture (routine single)     Status: None (Preliminary result)   Collection Time: 05/12/20  9:20 PM   Specimen: BLOOD LEFT FOREARM  Result Value Ref Range Status   Specimen Description   Final    BLOOD LEFT FOREARM Performed at  [facility], [place] [address] [place], [place] [address]    Special Requests   Final    BOTTLES DRAWN AEROBIC AND ANAEROBIC Blood Culture adequate volume Performed at [place] [address] [place], [place] [address]    Culture   Final    NO GROWTH 4 DAYS Performed at [facility], [place] [address] [place], [place] [address]    Report Status PENDING  Incomplete  SARS Coronavirus 2 by RT PCR (hospital order, performed in [facility] hospital lab) Nasopharyngeal Nasopharyngeal Swab     Status: None   Collection Time: 05/12/20  9:20 PM   Specimen: Nasopharyngeal Swab  Result Value Ref Range Status   SARS Coronavirus 2 NEGATIVE NEGATIVE Final    Comment: (NOTE) SARS-CoV-2 target nucleic acids are NOT DETECTED.  The SARS-CoV-2 RNA is generally detectable in upper and lower respiratory specimens during the acute phase of infection. The lowest concentration of SARS-CoV-2 viral copies this assay can detect is 250 copies / mL. A negative result does not preclude SARS-CoV-2 infection and should not be used as the sole basis for treatment or other patient management decisions.  A negative result may occur with improper specimen collection / handling, submission of specimen other than nasopharyngeal swab, presence of viral mutation(s) within the areas targeted by this assay, and inadequate number of viral copies (<250 copies / mL). A negative result must be combined with clinical observations, patient history, and epidemiological information.  Fact Sheet for Patients:   StrictlyIdeas.no  Fact Sheet for Healthcare Providers: BankingDealers.co.za  This test is not yet approved or  cleared by the [place] FDA and has been authorized for  detection and/or diagnosis of SARS-CoV-2 by FDA under an Emergency Use Authorization (EUA).  This EUA will remain in effect (meaning this test can be used)  for the duration of the COVID-19 declaration under Section 564(b)(1) of the Act, 21 U.S.C. section 360bbb-3(b)(1), unless the authorization is terminated or revoked sooner.  Performed at [facility], [place] [address], [place], [place] [address]   Culture, blood (single)     Status: None (Preliminary result)   Collection Time: 05/12/20  9:20 PM   Specimen: BLOOD LEFT HAND  Result Value Ref Range Status   Specimen Description   Final    BLOOD LEFT HAND Performed at [place] [address] [place], [place] [address]    Special Requests   Final    BOTTLES DRAWN AEROBIC AND ANAEROBIC Blood Culture adequate volume Performed at [place] [address], [place], [place] [address]    Culture   Final    NO GROWTH 4 DAYS Performed at [facility], [place] [address] [place], [place] [address]    Report Status PENDING  Incomplete  Urine culture     Status: Abnormal   Collection Time: 05/13/20  9:09 AM   Specimen: In/Out Cath Urine  Result Value Ref Range Status   Specimen Description   Final    IN/OUT CATH URINE Performed at [place] [address] [place], [place] [address]    Special Requests   Final    NONE Performed at [facility], [place] [address], [place], [place] [address]    Culture 2,000 COLONIES/mL STAPHYLOCOCCUS HAEMOLYTICUS (A)  Final   Report Status 05/15/2020 FINAL  Final   Organism ID, Bacteria STAPHYLOCOCCUS HAEMOLYTICUS (A)  Final      Susceptibility   Staphylococcus haemolyticus - MIC*    CIPROFLOXACIN <=0.5 SENSITIVE Sensitive     GENTAMICIN 2 SENSITIVE Sensitive     NITROFURANTOIN <=16 SENSITIVE Sensitive     OXACILLIN >=4 RESISTANT Resistant     TETRACYCLINE 4 SENSITIVE Sensitive     VANCOMYCIN 2 SENSITIVE Sensitive     TRIMETH/SULFA 160 RESISTANT Resistant     CLINDAMYCIN >=8 RESISTANT Resistant     RIFAMPIN <=0.5 SENSITIVE Sensitive      Inducible Clindamycin NEGATIVE Sensitive     * 2,000 COLONIES/mL STAPHYLOCOCCUS HAEMOLYTICUS         Radiology Studies: No results found.      Scheduled Meds:  amLODipine  10 mg Oral Daily   amoxicillin-clavulanate  1 tablet Oral Q12H   aspirin EC  81 mg Oral Daily   atorvastatin  40 mg Oral Daily   DULoxetine  30 mg Oral Daily   enoxaparin (LOVENOX) injection  40 mg Subcutaneous Y60Y   folic acid  1 mg Oral Daily   insulin aspart  0-15 Units Subcutaneous TID WC   loratadine  10 mg Oral Daily   losartan  100 mg Oral Daily   multivitamin with minerals  1 tablet Oral Daily   pantoprazole  40 mg Oral BID   thiamine  100 mg Oral Daily   Or   thiamine  100 mg Intravenous Daily   Continuous Infusions:    LOS: 5 days    Time spent: 35 minutes    Irine Seal, MD [facility]   To contact the attending provider between 7A-7P or the covering provider during after hours 7P-7A, please log into the web site [email or web address] and access using universal [place]  password for that web site. If you do not have the password, please call the hospital operator.  05/18/2020, 11:32 AM

## 2020-05-18 NOTE — TOC Progression Note (Signed)
Transition of Care [facility]) - Progression Note    Patient Details  Name: [name] [name] MRN: [id number] Date of Birth: Jul 16, 1961  Transition [facility] Care [facility]) CM/SW Contact  Servando Snare, [place] Phone Number: 05/18/2020, 9:20 AM  Clinical Narrative:   Facility has now rescinded bed offer. Patient has no insurance and will have to private pay. TOC will continue to work on finding facility for patient.    Expected Discharge Plan: [place] Barriers to Discharge: Continued Medical Work up  Expected Discharge Plan and Services Expected Discharge Plan: [place] arrangements for the past 2 months: Single Family Home                                       Social Determinants of Health (SDOH) Interventions    Readmission Risk Interventions No flowsheet data found.

## 2020-05-19 LAB — BASIC METABOLIC PANEL
Anion gap: 7 (ref 5–15)
BUN: 12 mg/dL (ref 6–20)
CO2: 23 mmol/L (ref 22–32)
Calcium: 8.6 mg/dL — ABNORMAL LOW (ref 8.9–10.3)
Chloride: 101 mmol/L (ref 98–111)
Creatinine, Ser: 0.58 mg/dL — ABNORMAL LOW (ref 0.61–1.24)
GFR calc Af Amer: >60 mL/min (ref >60)
GFR calc non Af Amer: >60 mL/min (ref >60)
Glucose, Bld: 202 mg/dL — ABNORMAL HIGH (ref 70–99)
Potassium: 4.1 mmol/L (ref 3.5–5.1)
Sodium: 131 mmol/L — ABNORMAL LOW (ref 135–145)

## 2020-05-19 LAB — GLUCOSE, CAPILLARY
Glucose-Capillary: 178 mg/dL — ABNORMAL HIGH (ref 70–99)
Glucose-Capillary: 210 mg/dL — ABNORMAL HIGH (ref 70–99)
Glucose-Capillary: 244 mg/dL — ABNORMAL HIGH (ref 70–99)
Glucose-Capillary: 195 mg/dL — ABNORMAL HIGH (ref 70–99)

## 2020-05-19 LAB — HEMOGLOBIN AND HEMATOCRIT, BLOOD
HCT: 40.3 % (ref 39.0–52.0)
Hemoglobin: 13.4 g/dL (ref 13.0–17.0)

## 2020-05-19 LAB — MAGNESIUM: Magnesium: 2.5 mg/dL — ABNORMAL HIGH (ref 1.7–2.4)

## 2020-05-19 MED ORDER — INSULIN GLARGINE 100 UNIT/ML ~~LOC~~ SOLN
5.0000 [IU] | Freq: Every day | SUBCUTANEOUS | Status: DC
  Administered 2020-05-19: 5 [IU] via SUBCUTANEOUS
  Filled 2020-05-19: qty 0.05

## 2020-05-19 NOTE — TOC Progression Note (Signed)
Transition of Care [facility]) - Progression Note    Patient Details  Name: [name] [name] MRN: [id number] Date of Birth: 03-Mar-1961  Transition of Care [facility]) CM/SW Contact  Servando Snare, [place] Phone Number: 05/19/2020, 9:26 AM  Clinical Narrative:   Spoke to sister about plan. Sister states they are not able to care for patient. After speaking to the admissions coordinator they do not have the funds to private pay to the capacity required by the facility. Sister reports there is no home for patient to come home to.     Expected Discharge Plan: [place] Barriers to Discharge: Continued Medical Work up  Expected Discharge Plan and Services Expected Discharge Plan: [place] arrangements for the past 2 months: Single Family Home                                       Social Determinants of Health (SDOH) Interventions    Readmission Risk Interventions No flowsheet data found.

## 2020-05-19 NOTE — Progress Notes (Addendum)
Harvey ambulated the length of the hall with front wheel walker and Tech standing by. Tolerated well.

## 2020-05-19 NOTE — Progress Notes (Signed)
PROGRESS NOTE    [name] [name]  LEX:[id number] DOB: 1961-04-01 DOA: 05/12/2020 PCP: [name], NP   Chief Complaint  Patient presents with  . Fall    Brief Narrative:  59 year old male with history of hypertension, diabetes mellitus type 2, GERD/hiatal hernia, depression, COPD, alcohol abuse, hepatitis C status post treatment/?  Cirrhosis of liver presented with altered mental status and fall.  Patient was confused on presentation.  Per EMS, mother was concerned for alcohol withdrawal; reported last drink on Thursday.  On presentation, WBCs was 12, sodium of 124, potassium 3.6 with elevated AST/ALT/total bili with ammonia 50.  Chest x-ray showed right hilar opacity/fullness.  Head CT was showing moderate to severe left maxillary sinusitis.  Covid test was negative.  He was started on IV fluids and antibiotics.   Assessment & Plan:   Active Problems:   Hepatic cirrhosis (HCC)   Type 2 diabetes mellitus with hyperlipidemia (HCC)   ETOH abuse   Essential hypertension   DTs (delirium tremens) ([place])   Hyponatremia   Hypokalemia   PNA (pneumonia)   SIRS (systemic inflammatory response syndrome) (HCC)   Pneumonia   Community acquired pneumonia of right lower lobe of lung   Elevated LFTs   Hypophosphatemia   Acute metabolic encephalopathy   Acute maxillary sinusitis   Depression   Physical deconditioning  1 right lower lobe community-acquired pneumonia On presentation chest x-ray consistent with right hilar opacity/fullness.  CT abdomen showed a right lower lobe infiltrate.  Patient pancultured.  COVID-19 PCR negative.  Clinical improvement.  Patient with sats of 94% on room air.  Status post 5 days azithromycin.  IV Unasyn has been transitioned to Augmentin to complete a 7 day course of antibiotic treatment.  Supportive care.     2.  Acute metabolic encephalopathy Likely secondary to alcohol dependence/withdrawal versus hepatic encephalopathy as patient noted to have  ammonia level of 50 versus secondary to pneumonia.  Head CT done negative.  Clinical improvement.  Right upper quadrant ultrasound with hepatic steatosis.  Continue antibiotics, Ativan withdrawal protocol, thiamine, multivitamin.  Patient was started on lactulose which has been discontinued.  Supportive care.   3.  Hyponatremia Likely secondary to poor oral intake versus SIADH.  Sodium levels fluctuated and currently at 131.  Urine sodium 154, urine creatinine 73.42, urine osmolality 645.  Serum osmolality 270.  Uric acid level ordered and at 1.1.  Saline lock IV fluids.  Patient with urine output of 900 cc over the past 24 hours.  Continue fluid restriction at 1500 cc/day. Follow.   4.  Hypokalemia/hypophosphatemia Electrolytes repleted.  Potassium of 4.1.  Magnesium at 2.5.  Follow.   5.  Alcohol abuse/alcohol withdrawal Currently stable.  Continue the Ativan withdrawal protocol, multivitamin, thiamine, folate.  Social work consulted.  Patient interested in alcohol cessation at this time.  Outpatient follow-up.  6.  Transaminitis/probable alcoholic hepatitis Right upper quadrant ultrasound with hepatic steatosis with no findings of cirrhosis.  LFTs fluctuating.  Outpatient follow-up.   7.  Maxillary sinusitis Continue Augmentin.   8.  Hypertension Continue losartan, amlodipine.  Follow.   9.  Well-controlled diabetes mellitus type 2 Hemoglobin A1c 6.1.  CBG 178 this morning.  Continue to hold oral hypoglycemic agents and Invokana.  Place on Lantus 5 units daily.  Sliding scale insulin.  10.  Depression Continue Cymbalta.   11.  Generalized deconditioning PT/OT.  SNF recommended.  Social work consulted and awaiting SNF placement.  Difficult placement due to lack of insurance.  DVT prophylaxis: Heparin Code Status: Full Family Communication: Updated patient.  No family at bedside.  Disposition:   Status is: Inpatient    Dispo: The patient is from: Home               Anticipated d/c is to: Awaiting SNF              Anticipated d/c date is: Awaiting SNF placement              Patient currently on antibiotics for pneumonia.  Medically stable for discharge.  Awaiting SNF placement.       Consultants:   None  Procedures:   CT head without contrast 05/12/2020  CT abdomen and pelvis 05/13/2020  Chest x-ray 05/12/2020  Abdominal ultrasound 05/13/2020  Antimicrobials:  IV Unasyn 05/13/2020>>>> 05/16/2020  IV azithromycin 05/12/2020>>>>05/16/2020  IV Rocephin 9/12/ 2021x1 dose  Augmentin 05/16/2020>>>>   Subjective: Patient noted to be ambulating in the hallway with the aid of a walker with a nurse tech.  Patient denies any significant shortness of breath.  No chest pain.  Feeling better.  Awaiting placement.    Objective: Vitals:   05/18/20 1353 05/18/20 2030 05/19/20 0513 05/19/20 1044  BP: 105/69 138/80 117/78 127/82  Pulse: 89 73 85   Resp: (!) 25 16 16    Temp: 98.3 F (36.8 C) 98.4 F (36.9 C) 98.2 F (36.8 C)   TempSrc: Oral Oral Oral   SpO2: 94% 98% 93%   Weight:      Height:        Intake/Output Summary (Last 24 hours) at 05/19/2020 1240 Last data filed at 05/19/2020 0831 Gross per 24 hour  Intake 832 ml  Output 900 ml  Net -68 ml   Filed Weights   05/12/20 1900 05/14/20 1542 05/17/20 0548  Weight: 81.2 kg 79.4 kg 77.9 kg    Examination:  General exam: NAD. Respiratory system: CTAB.  No wheezes, no crackles, no rhonchi.  Fair air movement.  Speaking in full sentences. Cardiovascular system: RRR no murmurs rubs or gallops.  No JVD.  No lower extremity edema.  Gastrointestinal system: Abdomen is nontender, nondistended, soft, positive bowel sounds.  No rebound.  No guarding. Central nervous system: Alert and oriented. No focal neurological deficits. Extremities: Symmetric 5 x 5 power. Skin: No rashes, lesions or ulcers Psychiatry: Judgement and insight appear normal. Mood & affect appropriate.     Data Reviewed: I  have personally reviewed following labs and imaging studies  CBC: Recent Labs  Lab 05/12/20 2120 05/12/20 2120 05/13/20 0512 05/13/20 0512 05/14/20 0420 05/15/20 0520 05/16/20 0556 05/18/20 0618 05/19/20 0624  WBC 11.7*  --  9.7  --  8.3 7.6 8.2  --   --   NEUTROABS 10.5*  --   --   --   --   --  5.9  --   --   HGB 12.5*   < > 11.6*   < > 11.2* 12.0* 12.5* 13.0 13.4  HCT 35.3*   < > 32.4*   < > 32.8* 35.3* 36.5* 38.9* 40.3  MCV 88.9  --  88.5  --  91.6 93.9 93.1  --   --   PLT 147*  --  144*  --  189 219 303  --   --    < > = values in this interval not displayed.    Basic Metabolic Panel: Recent Labs  Lab 05/13/20 0511 05/13/20 0981 05/15/20 0520 05/15/20 0520 05/16/20 0556 05/16/20 1331 05/17/20 0549  05/18/20 0618 05/19/20 0624  NA  --    < > 130*   < > 126* 131* 127* 133* 131*  K  --    < > 3.8   < > 4.3 4.0 3.8 3.8 4.1  CL  --    < > 96*   < > 97* 96* 99 101 101  CO2  --    < > 24   < > 22 23 20* 23 23  GLUCOSE  --    < > 126*   < > 131* 133* 137* 167* 202*  BUN  --    < > 9   < > 7 10 11 13 12   CREATININE  --    < > 0.35*   < > 0.35* 0.40* 0.35* 0.52* 0.58*  CALCIUM  --    < > 7.7*   < > 7.8* 8.5* 7.7* 8.3* 8.6*  MG 2.3   < > 2.1  --  2.2  --  2.3 2.4 2.5*  PHOS 1.3*  --  1.3*  --  2.2*  --  2.9 2.6  --    < > = values in this interval not displayed.    GFR: Estimated Creatinine Clearance: 108.5 mL/min (A) (by C-G formula based on SCr of 0.58 mg/dL (L)).  Liver Function Tests: Recent Labs  Lab 05/14/20 0420 05/15/20 0520 05/16/20 0556 05/17/20 0549 05/18/20 0618  AST 269* 195* 161* 159* 204*  ALT 104* 90* 86* 101* 154*  ALKPHOS 67 87 99 101 111  BILITOT 1.7* 1.6* 1.7* 1.9* 1.8*  PROT 5.9* 6.0* 6.5 6.5 7.0  ALBUMIN 2.3* 2.3* 2.5* 2.5* 2.8*    CBG: Recent Labs  Lab 05/18/20 1137 05/18/20 1714 05/18/20 2027 05/19/20 0737 05/19/20 1142  GLUCAP 179* 192* 189* 178* 210*     Recent Results (from the past 240 hour(s))  Blood culture  (routine single)     Status: None   Collection Time: 05/12/20  9:20 PM   Specimen: BLOOD LEFT FOREARM  Result Value Ref Range Status   Specimen Description   Final    BLOOD LEFT FOREARM Performed at [facility], [place] [address], [place], [place] [address]    Special Requests   Final    BOTTLES DRAWN AEROBIC AND ANAEROBIC Blood Culture adequate volume Performed at [place] [address] [place], [place] [address]    Culture   Final    NO GROWTH 5 DAYS Performed at [facility], [place] [address], [place], [place] [address]    Report Status 05/18/2020 FINAL  Final  SARS Coronavirus 2 by RT PCR (hospital order, performed in [facility] hospital lab) Nasopharyngeal Nasopharyngeal Swab     Status: None   Collection Time: 05/12/20  9:20 PM   Specimen: Nasopharyngeal Swab  Result Value Ref Range Status   SARS Coronavirus 2 NEGATIVE NEGATIVE Final    Comment: (NOTE) SARS-CoV-2 target nucleic acids are NOT DETECTED.  The SARS-CoV-2 RNA is generally detectable in upper and lower respiratory specimens during the acute phase of infection. The lowest concentration of SARS-CoV-2 viral copies this assay can detect is 250 copies / mL. A negative result does not preclude SARS-CoV-2 infection and should not be used as the sole basis for treatment or other patient management decisions.  A negative result may occur with improper specimen collection / handling, submission of specimen other than nasopharyngeal swab, presence of viral mutation(s) within the areas targeted by this assay, and  inadequate number of viral copies (<250 copies / mL). A negative result must be combined with clinical observations, patient history, and epidemiological information.  Fact Sheet for Patients:   StrictlyIdeas.no  Fact Sheet for Healthcare Providers: BankingDealers.co.za  This test is not yet approved or  cleared by  the [place] FDA and has been authorized for detection and/or diagnosis of SARS-CoV-2 by FDA under an Emergency Use Authorization (EUA).  This EUA will remain in effect (meaning this test can be used) for the duration of the COVID-19 declaration under Section 564(b)(1) of the Act, 21 U.S.C. section 360bbb-3(b)(1), unless the authorization is terminated or revoked sooner.  Performed at [facility], [place] [address] [place], [place] [address]   Culture, blood (single)     Status: None   Collection Time: 05/12/20  9:20 PM   Specimen: BLOOD LEFT HAND  Result Value Ref Range Status   Specimen Description   Final    BLOOD LEFT HAND Performed at [place] [address] [place], [place] [address]    Special Requests   Final    BOTTLES DRAWN AEROBIC AND ANAEROBIC Blood Culture adequate volume Performed at [place] [address] [place], [place] [address]    Culture   Final    NO GROWTH 5 DAYS Performed at [facility], [place] [address] [place], [place] [address]    Report Status 05/18/2020 FINAL  Final  Urine culture     Status: Abnormal   Collection Time: 05/13/20  9:09 AM   Specimen: In/Out Cath Urine  Result Value Ref Range Status   Specimen Description   Final    IN/OUT CATH URINE Performed at [facility] [address] [place], [place] [address]    Special Requests   Final    NONE Performed at [facility], [place] [address], [place], [place] [address]    Culture 2,000 COLONIES/mL STAPHYLOCOCCUS HAEMOLYTICUS (A)  Final   Report Status 05/15/2020 FINAL  Final   Organism ID, Bacteria STAPHYLOCOCCUS HAEMOLYTICUS (A)  Final      Susceptibility   Staphylococcus haemolyticus - MIC*    CIPROFLOXACIN <=0.5 SENSITIVE Sensitive     GENTAMICIN 2 SENSITIVE Sensitive     NITROFURANTOIN <=16 SENSITIVE Sensitive     OXACILLIN >=4 RESISTANT Resistant     TETRACYCLINE 4  SENSITIVE Sensitive     VANCOMYCIN 2 SENSITIVE Sensitive     TRIMETH/SULFA 160 RESISTANT Resistant     CLINDAMYCIN >=8 RESISTANT Resistant     RIFAMPIN <=0.5 SENSITIVE Sensitive     Inducible Clindamycin NEGATIVE Sensitive     * 2,000 COLONIES/mL STAPHYLOCOCCUS HAEMOLYTICUS         Radiology Studies: No results found.      Scheduled Meds: . amLODipine  10 mg Oral Daily  . amoxicillin-clavulanate  1 tablet Oral Q12H  . aspirin EC  81 mg Oral Daily  . atorvastatin  40 mg Oral Daily  . DULoxetine  30 mg Oral Daily  . enoxaparin (LOVENOX) injection  40 mg Subcutaneous Q24H  . folic acid  1 mg Oral Daily  . insulin aspart  0-15 Units Subcutaneous TID WC  . loratadine  10 mg Oral Daily  . losartan  100 mg Oral Daily  . multivitamin with minerals  1 tablet Oral Daily  . pantoprazole  40 mg Oral BID  . thiamine  100 mg Oral Daily   Or  . thiamine  100 mg  Intravenous Daily   Continuous Infusions:    LOS: 6 days    Time spent: 35 minutes    Irine Seal, MD [facility]   To contact the attending provider between 7A-7P or the covering provider during after hours 7P-7A, please log into the web site [email or web address] and access using universal [place] password for that web site. If you do not have the password, please call the hospital operator.  05/19/2020, 12:40 PM

## 2020-05-20 ENCOUNTER — Other Ambulatory Visit: Payer: Self-pay | Admitting: Family Medicine

## 2020-05-20 ENCOUNTER — Other Ambulatory Visit: Payer: Self-pay | Admitting: Nurse Practitioner

## 2020-05-20 DIAGNOSIS — Z09 Encounter for follow-up examination after completed treatment for conditions other than malignant neoplasm: Secondary | ICD-10-CM

## 2020-05-20 LAB — GLUCOSE, CAPILLARY
Glucose-Capillary: 220 mg/dL — ABNORMAL HIGH (ref 70–99)
Glucose-Capillary: 204 mg/dL — ABNORMAL HIGH (ref 70–99)
Glucose-Capillary: 208 mg/dL — ABNORMAL HIGH (ref 70–99)
Glucose-Capillary: 144 mg/dL — ABNORMAL HIGH (ref 70–99)
Glucose-Capillary: 178 mg/dL — ABNORMAL HIGH (ref 70–99)
Glucose-Capillary: 155 mg/dL — ABNORMAL HIGH (ref 70–99)

## 2020-05-20 MED ORDER — LORATADINE 10 MG PO TABS
10.0000 mg | ORAL_TABLET | Freq: Every day | ORAL | 0 refills | Status: DC
Start: 2020-05-21 — End: 2020-06-14

## 2020-05-20 MED ORDER — PANTOPRAZOLE SODIUM 40 MG PO TBEC
40.0000 mg | DELAYED_RELEASE_TABLET | Freq: Every day | ORAL | 1 refills | Status: DC
Start: 2020-05-20 — End: 2020-06-14

## 2020-05-20 MED ORDER — FOLIC ACID 1 MG PO TABS: 1.0000 mg | ORAL_TABLET | Freq: Every day | ORAL | Status: DC

## 2020-05-20 MED ORDER — DULOXETINE HCL 30 MG PO CPEP: 30.0000 mg | ORAL_CAPSULE | Freq: Every day | ORAL | 1 refills | Status: DC

## 2020-05-20 MED ORDER — INSULIN GLARGINE 100 UNIT/ML ~~LOC~~ SOLN
8.0000 [IU] | Freq: Every day | SUBCUTANEOUS | Status: DC
  Administered 2020-05-20: 8 [IU] via SUBCUTANEOUS
  Filled 2020-05-20: qty 0.08

## 2020-05-20 MED ORDER — THIAMINE HCL 100 MG PO TABS: 100.0000 mg | ORAL_TABLET | Freq: Every day | ORAL | Status: DC

## 2020-05-20 MED FILL — MIRTAZAPINE 15 MG TABLET: 15 | 30 days supply | Qty: 30 | Fill #0

## 2020-05-20 MED FILL — OMEPRAZOLE DR 40 MG CAPSULE: 40 | 30 days supply | Qty: 30 | Fill #4

## 2020-05-20 MED FILL — LOSARTAN POTASSIUM 100 MG T: 100 | 30 days supply | Qty: 30 | Fill #3

## 2020-05-20 MED FILL — AMLODIPINE BESYLATE 10 MG T: 10 | 30 days supply | Qty: 30 | Fill #5

## 2020-05-20 MED FILL — ?ATORVASTATIN 40MG TABLET: 40 | 30 days supply | Qty: 30 | Fill #3

## 2020-05-20 MED FILL — DOXYCYCLINE MONO 100 MG CAP: 100 | 7 days supply | Qty: 14 | Fill #0

## 2020-05-20 MED FILL — ?GLIPIZIDE 5MG TABLET: 5 | 30 days supply | Qty: 30 | Fill #0

## 2020-05-20 MED FILL — INVOKANA 300 MG TABLET: 300 | 30 days supply | Qty: 30 | Fill #2

## 2020-05-20 MED FILL — AMOX-CLAV 875-125 MG TABLET: 875-125 | 7 days supply | Qty: 14 | Fill #0

## 2020-05-20 NOTE — Progress Notes (Signed)
Physical Therapy Treatment Patient Details Name: [name] [name] MRN: [id number] DOB: 1961/08/24 Today's Date: 05/20/2020    History of Present Illness 59 year old male with history of hypertension, diabetes mellitus type 2, GERD/hiatal hernia, depression, COPD, alcohol abuse, hepatitis C, cirrhosis of liver. Pt presented with altered mental status and fall. Patient was confused on presentation.  Per EMS, mother was concerned for alcohol withdrawal.    PT Comments    Pt reports feeling much better. amb ~ 800' with RW to no device. Mildly unsteady gait without use of RW. Pt nearing baseline for mobility. May need a RW for use at d/c. No f/u PT recommended at this time,  Encouraged pt to continue to mobilize. Pt in agreement with plans  Follow Up Recommendations  No PT follow up;Supervision - Intermittent     Equipment Recommendations  Rolling walker with 5" wheels    Recommendations for Other Services       Precautions / Restrictions Precautions Precautions: Fall Restrictions Weight Bearing Restrictions: No    Mobility  Bed Mobility Overal bed mobility: Independent                Transfers   Equipment used: Rolling walker (2 wheeled) Transfers: Sit to/from Stand Sit to Stand: Modified independent (Device/Increase time)         General transfer comment: fall mat adding to mild unsteadiness on rising, without overt LOB  Ambulation/Gait Ambulation/Gait assistance: Supervision;Modified independent (Device/Increase time) Gait Distance (Feet): 800 Feet Assistive device: None;Rolling walker (2 wheeled) Gait Pattern/deviations: Step-through pattern;Decreased stride length;Wide base of support     General Gait Details: amb ~ 160' without RW, close supervision for safety, unsteady however no overt LOB. amb rest of distance with RW, no LOB, steady gait/incr stride length with UE support    Stairs             Wheelchair Mobility    Modified Rankin (Stroke  Patients Only)       Balance           Standing balance support: No upper extremity supported;During functional activity;Bilateral upper extremity supported Standing balance-Leahy Scale: Fair Standing balance comment: fair to good, not tested to mod challenges             High level balance activites: Side stepping;Turns;Head turns;Backward walking High Level Balance Comments: min/guard to close supervision for balance and safety. balance reactions delayed with min challenges, able to recover with steppage response            Cognition Arousal/Alertness: Awake/alert Behavior During Therapy: WFL for tasks assessed/performed Overall Cognitive Status: Within Functional Limits for tasks assessed                                        Exercises      General Comments        Pertinent Vitals/Pain Pain Assessment: No/denies pain    Home Living                      Prior Function            PT Goals (current goals can now be found in the care plan section) Acute Rehab PT Goals Patient Stated Goal: none stated PT Goal Formulation: With patient Time For Goal Achievement: 05/28/20 Potential to Achieve Goals: Good Progress towards PT goals: Progressing toward goals    Frequency  Min 3X/week      PT Plan Discharge plan needs to be updated    Co-evaluation              AM-PAC PT "6 Clicks" Mobility   Outcome Measure  Help needed turning from your back to your side while in a flat bed without using bedrails?: None Help needed moving from lying on your back to sitting on the side of a flat bed without using bedrails?: None Help needed moving to and from a bed to a chair (including a wheelchair)?: None Help needed standing up from a chair using your arms (e.g., wheelchair or bedside chair)?: None Help needed to walk in hospital room?: None Help needed climbing 3-5 steps with a railing? : A Little 6 Click Score: 23    End  of Session   Activity Tolerance: Patient tolerated treatment well Patient left: in bed;with call bell/phone within reach;with bed alarm set   PT Visit Diagnosis: Difficulty in walking, not elsewhere classified (R26.2);Muscle weakness (generalized) (M62.81);Unsteadiness on feet (R26.81);History of falling (Z91.81)     Time: 7681-1572 PT Time Calculation (min) (ACUTE ONLY): 19 min  Charges:  $Gait Training: 8-22 mins                     Baxter Flattery, PT  Acute Rehab Dept ([place]) [phone] Pager [phone]  05/20/2020    [facility] 05/20/2020, 12:13 PM

## 2020-05-20 NOTE — Discharge Summary (Addendum)
Physician Discharge Summary  [name] [name] NGE:[id number] DOB: 28-Aug-1961 DOA: 05/12/2020  PCP: [name], NP  Admit date: 05/12/2020 Discharge date: 05/21/2020  Time spent: 50 minutes  Recommendations for Outpatient Follow-up:  1. Follow-up with [name], NP in 2 weeks.  On follow-up patient will need a comprehensive metabolic profile done to follow-up on electrolytes, renal function, LFTs.  Patient's diabetes also need to be reassessed on follow-up.   Discharge Diagnoses:  Active Problems:   Hepatic cirrhosis (HCC)   Type 2 diabetes mellitus with hyperlipidemia (HCC)   ETOH abuse   Essential hypertension   DTs (delirium tremens) ([place])   Hyponatremia   Hypokalemia   PNA (pneumonia)   SIRS (systemic inflammatory response syndrome) (HCC)   Pneumonia   Community acquired pneumonia of right lower lobe of lung   Elevated LFTs   Hypophosphatemia   Acute metabolic encephalopathy   Acute maxillary sinusitis   Depression   Physical deconditioning   Discharge Condition: Stable and improved.  Diet recommendation: Carb modified diet.  1500 cc/day fluid restriction.  Filed Weights   05/14/20 1542 05/17/20 0548 05/20/20 0500  Weight: 79.4 kg 77.9 kg 72.9 kg    History of present illness:  HPI per Dr. Darden Dates is a 59 y.o. 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  Patient confused and unable to provide reliable medical history.  However, he reported diarrhea with nausea vomiting and generalized abdominal pain.  Per EMS mother was concerned for alcohol withdrawal.  Reported last drink on Thursday.  Lives with mother.  Active smoker 1 pack/day for past 37 years.  Reported drinking 1-2   22 ounce Busch light daily.  Denied any recreational drug use.  ED Course: BP 133/84, pulse 107-135, RR 20-40, SPO2 92-96% room  air T-max 103  Labs with WBC 12 K, platelets 147 Sodium 124, potassium 2.6, creatinine 0.48 Albumin 2.8, AST 376, ALT 129, total bili 2.2 EtOH less than 10, ammonia level 50 Lactic acid 1.5  -CXR with right hilar opacity/fullness.  Cannot rule out malignancy. -Head CT with moderate to severe left maxillary sinusitis.  Otherwise unremarkable. -EKG with sinus tach at 115 bpm, normal axis without significant ST-T wave changes.  Covid negative.  ED meds: -Ceftriaxone/azithromycin -Lactated Ringer x1 L -IV potassium replacement  Hospital Course:  1 right lower lobe community-acquired pneumonia/sepsis ruled out On presentation chest x-ray consistent with right hilar opacity/fullness.  CT abdomen showed a right lower lobe infiltrate.  Patient pancultured.  COVID-19 PCR negative.    Patient was placed empirically on IV Unasyn and azithromycin and improved clinically during the hospitalization.  Patient subsequently transitioned to oral Augmentin.  Patient completed a 5-day course of azithromycin.  Patient completed 7-day course of antibiotics during the hospitalization.  Patient improved clinically.  Patient with sats of 95% on room air by day of discharge.  Patient was ambulating in hallway without any significant shortness of breath.  Outpatient follow-up with PCP.   2.  Acute metabolic encephalopathy Likely secondary to alcohol dependence/withdrawal versus hepatic encephalopathy as patient noted to have ammonia level of 50 versus secondary to pneumonia.  Head CT done negative.  Right upper quadrant ultrasound with hepatic steatosis.  Patient was placed on lactulose during the hospitalization initially due to elevated ammonia levels.  Patient also placed on Ativan withdrawal protocol.  Patient improved clinically and was back to baseline  by day of discharge.  Lactulose was discontinued.  Outpatient follow-up.  3.  Hyponatremia Likely secondary to poor oral intake versus SIADH.  Sodium  levels fluctuated and stabilized at 131.  Urine sodium 154, urine creatinine 73.42, urine osmolality 645.  Serum osmolality 270.  Uric acid level ordered and at 1.1.    Patient was placed on fluid restriction of 1500 cc/day.  Hyponatremia remained stable and had improved during the hospitalization.  Outpatient follow-up.   4.  Hypokalemia/hypophosphatemia Electrolytes repleted.  Outpatient follow-up.  5.  Alcohol abuse/alcohol withdrawal Patient on admission was placed on Ativan withdrawal protocol, multivitamin, thiamine, folate.  Patient had no significant DTs or withdrawal during the hospitalization.  Patient remained stable.  Social work was consulted and patient was interested in alcohol cessation.  Patient be discharged to [facility]  Outpatient follow-up.   6.  Transaminitis/probable alcoholic hepatitis Right upper quadrant ultrasound with hepatic steatosis with no findings of cirrhosis.  LFTs fluctuating.  Outpatient follow-up.   7.  Maxillary sinusitis Patient initially was placed on IV Unasyn and subsequently transition to Augmentin and completed a full course of antibiotic treatment.   8.  Hypertension Patient maintained on home regimen losartan and amlodipine.  Outpatient follow-up.   9.  Well-controlled diabetes mellitus type 2 Hemoglobin A1c 6.1.    Patient's oral hypoglycemic agents were held during the hospitalization and patient maintained on Lantus and sliding scale insulin.  Patient's oral hypoglycemic agents will be resumed on discharge.  Outpatient follow-up.   10.  Depression Patient maintained on home regimen Cymbalta.  Outpatient follow-up.    11.  Generalized deconditioning PT/OT.  SNF recommended initially by physical therapy.  Social work was consulted.  Patient noted to be a difficult placement due to lack of insurance.  Patient seen by physical therapy during the hospitalization and worked with physical therapy.  Patient improved clinically such that he was  followed by physical therapy that SNF was no longer needed.  Patient will be discharged in stable condition.  Outpatient follow-up with PCP.     Procedures:  CT head without contrast 05/12/2020  CT abdomen and pelvis 05/13/2020  Chest x-ray 05/12/2020  Abdominal ultrasound 05/13/2020  Consultations:  None  Discharge Exam: Vitals:   05/21/20 0022 05/21/20 0528  BP: 123/80 (!) 143/81  Pulse: 88 77  Resp: 20 19  Temp: 98.5 F (36.9 C) 98.1 F (36.7 C)  SpO2: 95% 95%    General: NAD Cardiovascular: RRR Respiratory: CTAB  Discharge Instructions   Discharge Instructions    Diet Carb Modified   Complete by: As directed    1500cc/day fluid restriction   Increase activity slowly   Complete by: As directed      Allergies as of 05/21/2020      Reactions   Bee Venom Anaphylaxis   Lactose Intolerance (gi) Other (See Comments)   GI upset      Medication List    STOP taking these medications   amoxicillin-clavulanate 875-125 MG tablet Commonly known as: AUGMENTIN   cephALEXin 500 MG capsule Commonly known as: KEFLEX   chlordiazePOXIDE 25 MG capsule Commonly known as: LIBRIUM   doxycycline 100 MG capsule Commonly known as: VIBRAMYCIN   omeprazole 40 MG capsule Commonly known as: PRILOSEC   triamcinolone 0.025 % cream Commonly known as: KENALOG     TAKE these medications   albuterol 108 (90 Base) MCG/ACT inhaler Commonly known as: Ventolin HFA Inhale 2 puffs into the lungs every 6 (six) hours as needed for wheezing  or shortness of breath.   amLODipine 10 MG tablet Commonly known as: NORVASC Take 1 tablet (10 mg total) by mouth daily.   aspirin EC 81 MG tablet Take 1 tablet (81 mg total) by mouth daily.   atorvastatin 40 MG tablet Commonly known as: LIPITOR Take 1 tablet (40 mg total) by mouth daily.   canagliflozin 300 MG Tabs tablet Commonly known as: Invokana Take 1 tablet (300 mg total) by mouth daily before breakfast.   DULoxetine 30 MG  capsule Commonly known as: CYMBALTA Take 1 capsule (30 mg total) by mouth daily.   folic acid 1 MG tablet Commonly known as: FOLVITE Take 1 tablet (1 mg total) by mouth daily.   glipiZIDE 5 MG tablet Commonly known as: GLUCOTROL TAKE 1 TABLET (5 MG TOTAL) BY MOUTH DAILY BEFORE BREAKFAST.   loperamide 2 MG tablet Commonly known as: Imodium A-D Take 1 tablet (2 mg total) by mouth 4 (four) times daily as needed for diarrhea or loose stools.   loratadine 10 MG tablet Commonly known as: CLARITIN Take 1 tablet (10 mg total) by mouth daily.   losartan 100 MG tablet Commonly known as: COZAAR Take 1 tablet (100 mg total) by mouth daily.   mirtazapine 15 MG tablet Commonly known as: REMERON Take 1 tablet (15 mg total) by mouth at bedtime.   Multivitamin Adult Tabs Take 1 tablet by mouth daily.   mupirocin ointment 2 % Commonly known as: Bactroban Apply to affected areas twice per day. Patient will pick up scripts today.   ondansetron 4 MG tablet Commonly known as: ZOFRAN Take 1 tablet (4 mg total) by mouth every 8 (eight) hours as needed for nausea or vomiting.   pantoprazole 40 MG tablet Commonly known as: PROTONIX Take 1 tablet (40 mg total) by mouth daily.   thiamine 100 MG tablet Take 1 tablet (100 mg total) by mouth daily.            Durable Medical Equipment  (From admission, onward)         Start     Ordered   05/20/20 1220  For home use only DME Walker rolling  Once       Question Answer Comment  Walker: With 5 Inch Wheels   Patient needs a walker to treat with the following condition Debility      05/20/20 1220         Allergies  Allergen Reactions  . Bee Venom Anaphylaxis  . Lactose Intolerance (Gi) Other (See Comments)    GI upset    Follow-up Information    [name], NP. Schedule an appointment as soon as possible for a visit in 2 week(s).   Specialty: Nurse Practitioner Contact information: [place]  [address] [phone]                The results of significant diagnostics from this hospitalization (including imaging, microbiology, ancillary and laboratory) are listed below for reference.    Significant Diagnostic Studies: CT Head Wo Contrast  Result Date: 05/12/2020 CLINICAL DATA:  Altered mental status and subsequent fall. EXAM: CT HEAD WITHOUT CONTRAST TECHNIQUE: Contiguous axial images were obtained from the base of the skull through the vertex without intravenous contrast. COMPARISON:  May 05, 2020 FINDINGS: Brain: There is mild cerebral atrophy with widening of the extra-axial spaces and ventricular dilatation. There are areas of decreased attenuation within the white matter tracts of the supratentorial brain, consistent with microvascular disease changes. Vascular: No  hyperdense vessel or unexpected calcification. Skull: Normal. Negative for fracture or focal lesion. Sinuses/Orbits: There is moderate to marked severity left maxillary sinus mucosal thickening. Other: None. IMPRESSION: 1. Generalized cerebral atrophy. 2. No acute intracranial abnormality. 3. Moderate to marked severity left maxillary sinus disease. Electronically Signed   By: Virgina Norfolk M.D.   On: 05/12/2020 21:36   CT Head Wo Contrast  Result Date: 05/05/2020 CLINICAL DATA:  Fall, ETOH EXAM: CT HEAD WITHOUT CONTRAST CT CERVICAL SPINE WITHOUT CONTRAST TECHNIQUE: Multidetector CT imaging of the head and cervical spine was performed following the standard protocol without intravenous contrast. Multiplanar CT image reconstructions of the cervical spine were also generated. COMPARISON:  04/08/2020 FINDINGS: CT HEAD FINDINGS Brain: No evidence of acute infarction, hemorrhage, hydrocephalus, extra-axial collection or mass lesion/mass effect. Mild subcortical white matter and periventricular small vessel ischemic changes. Vascular: Mild intracranial atherosclerosis. Skull: Normal. Negative for fracture or focal  lesion. Sinuses/Orbits: Near complete opacification of the left maxillary sinus, chronic. Partial opacification of the left mastoid air cells, chronic. Other: None. CT CERVICAL SPINE FINDINGS Alignment: Normal cervical lordosis. Skull base and vertebrae: No acute fracture. No primary bone lesion or focal pathologic process. Soft tissues and spinal canal: No prevertebral fluid or swelling. No visible canal hematoma. Disc levels: Mild to moderate degenerative changes of the mid/lower cervical spine. Spinal canal is patent. Upper chest: Centrilobular and paraseptal emphysematous changes at the lung apices. Other: Visualized thyroid is unremarkable. IMPRESSION: No evidence of acute intracranial abnormality. Mild small vessel ischemic changes. No evidence of traumatic injury to the cervical spine. Mild to moderate degenerative changes. Electronically Signed   By: Julian Hy M.D.   On: 05/05/2020 06:11   CT Cervical Spine Wo Contrast  Result Date: 05/05/2020 CLINICAL DATA:  Fall, ETOH EXAM: CT HEAD WITHOUT CONTRAST CT CERVICAL SPINE WITHOUT CONTRAST TECHNIQUE: Multidetector CT imaging of the head and cervical spine was performed following the standard protocol without intravenous contrast. Multiplanar CT image reconstructions of the cervical spine were also generated. COMPARISON:  04/08/2020 FINDINGS: CT HEAD FINDINGS Brain: No evidence of acute infarction, hemorrhage, hydrocephalus, extra-axial collection or mass lesion/mass effect. Mild subcortical white matter and periventricular small vessel ischemic changes. Vascular: Mild intracranial atherosclerosis. Skull: Normal. Negative for fracture or focal lesion. Sinuses/Orbits: Near complete opacification of the left maxillary sinus, chronic. Partial opacification of the left mastoid air cells, chronic. Other: None. CT CERVICAL SPINE FINDINGS Alignment: Normal cervical lordosis. Skull base and vertebrae: No acute fracture. No primary bone lesion or focal  pathologic process. Soft tissues and spinal canal: No prevertebral fluid or swelling. No visible canal hematoma. Disc levels: Mild to moderate degenerative changes of the mid/lower cervical spine. Spinal canal is patent. Upper chest: Centrilobular and paraseptal emphysematous changes at the lung apices. Other: Visualized thyroid is unremarkable. IMPRESSION: No evidence of acute intracranial abnormality. Mild small vessel ischemic changes. No evidence of traumatic injury to the cervical spine. Mild to moderate degenerative changes. Electronically Signed   By: Julian Hy M.D.   On: 05/05/2020 06:11   CT Abdomen Pelvis W Contrast  Result Date: 05/13/2020 CLINICAL DATA:  Abdominal pain and fever. EXAM: CT ABDOMEN AND PELVIS WITH CONTRAST TECHNIQUE: Multidetector CT imaging of the abdomen and pelvis was performed using the standard protocol following bolus administration of intravenous contrast. CONTRAST:  177mL OMNIPAQUE IOHEXOL 300 MG/ML  SOLN COMPARISON:  November 02, 2013 FINDINGS: Lower chest: Marked severity infiltrate is seen along the posterior aspect of the right lung base. Hepatobiliary: There is diffuse  fatty infiltration of the liver parenchyma. No focal liver abnormality is seen. A punctate gallstone is seen within the gallbladder lumen (axial CT image 32, CT series number 2) without evidence of gallbladder wall thickening or biliary dilatation. Pancreas: Unremarkable. No pancreatic ductal dilatation or surrounding inflammatory changes. Spleen: Normal in size without focal abnormality. Adrenals/Urinary Tract: Adrenal glands are unremarkable. Kidneys are normal, without renal calculi, focal lesion, or hydronephrosis. Bladder is unremarkable. Stomach/Bowel: Stomach is within normal limits. The appendix is not clearly identified. No evidence of bowel wall thickening, distention, or inflammatory changes. Noninflamed diverticula are seen within the descending and sigmoid colon. Vascular/Lymphatic: There is  moderate to marked severity calcification of the abdominal aorta and bilateral common iliac arteries, without evidence of aneurysmal dilatation. No enlarged abdominal or pelvic lymph nodes. Reproductive: The prostate gland is mildly enlarged. Other: No abdominal wall hernia or abnormality. No abdominopelvic ascites. Musculoskeletal: No acute or significant osseous findings. IMPRESSION: 1. Marked severity right lower lobe infiltrate. 2. Cholelithiasis without evidence of acute cholecystitis. 3. Colonic diverticulosis. 4. Mildly enlarged prostate gland. 5. Aortic atherosclerosis. Aortic Atherosclerosis (ICD10-I70.0). Electronically Signed   By: Virgina Norfolk M.D.   On: 05/13/2020 01:27   DG Chest Port 1 View  Result Date: 05/12/2020 CLINICAL DATA:  Sepsis. EXAM: PORTABLE CHEST 1 VIEW COMPARISON:  March 14, 2019 FINDINGS: There is an ill-defined, somewhat hazy right hilar/infrahilar airspace opacity. There is fullness of the right hilum. The left lung field is essentially clear. The heart size is normal. Aortic calcifications are noted. There are advanced degenerative changes of the right glenohumeral joint. IMPRESSION: Right hilar/infrahilar airspace opacity concerning for pneumonia. There is some fullness of the right hilum which may be secondary to adenopathy. Follow-up to radiologic resolution is recommended. Electronically Signed   By: Constance Holster M.D.   On: 05/12/2020 19:39   DG Shoulder Left  Result Date: 05/04/2020 CLINICAL DATA:  Left shoulder pain EXAM: LEFT SHOULDER - 2+ VIEW COMPARISON:  None. FINDINGS: Two view radiograph left shoulder demonstrates anteroinferior left shoulder dislocation no definite associated fracture. Acromioclavicular joint space is preserved. Limited evaluation of the left apex is unremarkable. IMPRESSION: Anteroinferior left shoulder dislocation. Electronically Signed   By: Fidela Salisbury MD   On: 05/04/2020 23:02   DG Shoulder Left Portable  Result Date:  05/05/2020 CLINICAL DATA:  Post reduction left shoulder EXAM: LEFT SHOULDER COMPARISON:  05/04/2020 FINDINGS: Interval reduction of the left shoulder. Suspected Hill-Sachs deformity involving the posterolateral humeral head. No fracture of the inferior glenoid rim. Visualized soft tissues are within normal limits. Visualized left lung is clear. IMPRESSION: Interval reduction of the left shoulder. Suspected Hill-Sachs deformity involving the posterolateral humeral head. Electronically Signed   By: Julian Hy M.D.   On: 05/05/2020 06:54   DG Foot Complete Right  Result Date: 05/05/2020 CLINICAL DATA:  Wound on the right third toe for about 2 weeks. EXAM: RIGHT FOOT COMPLETE - 3+ VIEW COMPARISON:  02/22/2018. FINDINGS: No acute fracture. There is an old healed fracture of the proximal phalanx of the second toe. No bone lesions. There are no areas of bone resorption to suggest osteomyelitis. Joints are normally spaced and aligned.  No arthropathic changes. Small plantar calcaneal spur. Soft tissues are unremarkable. IMPRESSION: 1. No acute fracture or acute finding. No evidence of osteomyelitis. Electronically Signed   By: Lajean Manes M.D.   On: 05/05/2020 07:43   US Abdomen Limited RUQ  Result Date: 05/13/2020 CLINICAL DATA:  Elevated liver function tests. EXAM: ULTRASOUND ABDOMEN  LIMITED RIGHT UPPER QUADRANT COMPARISON:  CT abdomen and pelvis 05/13/2020 FINDINGS: Gallbladder: 10 mm gallstone. No gallbladder wall thickening. No sonographic Murphy sign noted by sonographer. Common bile duct: Diameter: 4 mm Liver: Prominently increased parenchymal echogenicity diffusely without a focal lesion identified. Portal vein is patent on color Doppler imaging with normal direction of blood flow towards the liver. Other: Trace perihepatic fluid. IMPRESSION: 1. Cholelithiasis without evidence of cholecystitis. 2. Hepatic steatosis. 3. Trace perihepatic ascites. Electronically Signed   By: Logan Bores M.D.   On:  05/13/2020 13:02    Microbiology: Recent Results (from the past 240 hour(s))  Blood culture (routine single)     Status: None   Collection Time: 05/12/20  9:20 PM   Specimen: BLOOD LEFT FOREARM  Result Value Ref Range Status   Specimen Description   Final    BLOOD LEFT FOREARM Performed at [facility] [address], [place], [place] [address]    Special Requests   Final    BOTTLES DRAWN AEROBIC AND ANAEROBIC Blood Culture adequate volume Performed at [facility] [address], [place], [place] [address]    Culture   Final    NO GROWTH 5 DAYS Performed at [facility], [place] [address], [place], [place] [address]    Report Status 05/18/2020 FINAL  Final  SARS Coronavirus 2 by RT PCR (hospital order, performed in [facility] hospital lab) Nasopharyngeal Nasopharyngeal Swab     Status: None   Collection Time: 05/12/20  9:20 PM   Specimen: Nasopharyngeal Swab  Result Value Ref Range Status   SARS Coronavirus 2 NEGATIVE NEGATIVE Final    Comment: (NOTE) SARS-CoV-2 target nucleic acids are NOT DETECTED.  The SARS-CoV-2 RNA is generally detectable in upper and lower respiratory specimens during the acute phase of infection. The lowest concentration of SARS-CoV-2 viral copies this assay can detect is 250 copies / mL. A negative result does not preclude SARS-CoV-2 infection and should not be used as the sole basis for treatment or other patient management decisions.  A negative result may occur with improper specimen collection / handling, submission of specimen other than nasopharyngeal swab, presence of viral mutation(s) within the areas targeted by this assay, and inadequate number of viral copies (<250 copies / mL). A negative result must be combined with clinical observations, patient history, and epidemiological information.  Fact Sheet for Patients:   StrictlyIdeas.no  Fact Sheet for Healthcare  Providers: BankingDealers.co.za  This test is not yet approved or  cleared by the [place] FDA and has been authorized for detection and/or diagnosis of SARS-CoV-2 by FDA under an Emergency Use Authorization (EUA).  This EUA will remain in effect (meaning this test can be used) for the duration of the COVID-19 declaration under Section 564(b)(1) of the Act, 21 U.S.C. section 360bbb-3(b)(1), unless the authorization is terminated or revoked sooner.  Performed at [facility], [place] [address], [place], [place] [address]   Culture, blood (single)     Status: None   Collection Time: 05/12/20  9:20 PM   Specimen: BLOOD LEFT HAND  Result Value Ref Range Status   Specimen Description   Final    BLOOD LEFT HAND Performed at [facility] [address], [place], [place] [address]    Special Requests   Final    BOTTLES DRAWN AEROBIC AND ANAEROBIC Blood Culture adequate volume Performed at [facility] [address] [place], [place] [address]  Culture   Final    NO GROWTH 5 DAYS Performed at [facility], [place] [address] [place], [place] [address]    Report Status 05/18/2020 FINAL  Final  Urine culture     Status: Abnormal   Collection Time: 05/13/20  9:09 AM   Specimen: In/Out Cath Urine  Result Value Ref Range Status   Specimen Description   Final    IN/OUT CATH URINE Performed at [facility] [address], [place], [place] [address]    Special Requests   Final    NONE Performed at [facility], [place] [address], [place], [place] [address]    Culture 2,000 COLONIES/mL STAPHYLOCOCCUS HAEMOLYTICUS (A)  Final   Report Status 05/15/2020 FINAL  Final   Organism ID, Bacteria STAPHYLOCOCCUS HAEMOLYTICUS (A)  Final      Susceptibility   Staphylococcus haemolyticus - MIC*    CIPROFLOXACIN <=0.5 SENSITIVE Sensitive     GENTAMICIN 2 SENSITIVE Sensitive      NITROFURANTOIN <=16 SENSITIVE Sensitive     OXACILLIN >=4 RESISTANT Resistant     TETRACYCLINE 4 SENSITIVE Sensitive     VANCOMYCIN 2 SENSITIVE Sensitive     TRIMETH/SULFA 160 RESISTANT Resistant     CLINDAMYCIN >=8 RESISTANT Resistant     RIFAMPIN <=0.5 SENSITIVE Sensitive     Inducible Clindamycin NEGATIVE Sensitive     * 2,000 COLONIES/mL STAPHYLOCOCCUS HAEMOLYTICUS     Labs: Basic Metabolic Panel: Recent Labs  Lab 05/15/20 0520 05/15/20 0520 05/16/20 0556 05/16/20 1331 05/17/20 0549 05/18/20 0618 05/19/20 0624  NA 130*   < > 126* 131* 127* 133* 131*  K 3.8   < > 4.3 4.0 3.8 3.8 4.1  CL 96*   < > 97* 96* 99 101 101  CO2 24   < > 22 23 20* 23 23  GLUCOSE 126*   < > 131* 133* 137* 167* 202*  BUN 9   < > 7 10 11 13 12   CREATININE 0.35*   < > 0.35* 0.40* 0.35* 0.52* 0.58*  CALCIUM 7.7*   < > 7.8* 8.5* 7.7* 8.3* 8.6*  MG 2.1  --  2.2  --  2.3 2.4 2.5*  PHOS 1.3*  --  2.2*  --  2.9 2.6  --    < > = values in this interval not displayed.   Liver Function Tests: Recent Labs  Lab 05/15/20 0520 05/16/20 0556 05/17/20 0549 05/18/20 0618  AST 195* 161* 159* 204*  ALT 90* 86* 101* 154*  ALKPHOS 87 99 101 111  BILITOT 1.6* 1.7* 1.9* 1.8*  PROT 6.0* 6.5 6.5 7.0  ALBUMIN 2.3* 2.5* 2.5* 2.8*   No results for input(s): LIPASE, AMYLASE in the last 168 hours. Recent Labs  Lab 05/16/20 1159  AMMONIA 25   CBC: Recent Labs  Lab 05/15/20 0520 05/16/20 0556 05/18/20 0618 05/19/20 0624  WBC 7.6 8.2  --   --   NEUTROABS  --  5.9  --   --   HGB 12.0* 12.5* 13.0 13.4  HCT 35.3* 36.5* 38.9* 40.3  MCV 93.9 93.1  --   --   PLT 219 303  --   --    Cardiac Enzymes: No results for input(s): CKTOTAL, CKMB, CKMBINDEX, TROPONINI in the last 168 hours. BNP: BNP (last 3 results) No results for input(s): BNP in the last 8760 hours.  ProBNP (last 3 results) No results for input(s): PROBNP in the last 8760 hours.  CBG: Recent Labs  Lab 05/20/20 0740 05/20/20 1121  05/20/20 1658 05/20/20 1949 05/20/20 2152  GLUCAP 204* 208* 144* 178* 155*       Signed:  Irine Seal MD.  [facility] 05/21/2020, 7:40 AM

## 2020-05-20 NOTE — Progress Notes (Signed)
Patient has had a fall at bedside. RN did not witness. NT was assisting patient to walk when NT was plugging up patients phone, the patient got out of bed and reached for the walker. Per pt, he was opening the arms of it when he fell backwards. He feel on his R to back side on his bottom. Scapes to R knee and R elbow. Patient is AO x4. No abnormal neuro symptoms. No tingling/numbness. No headache. Grip is normal. Able to move all extremities. Foam applied. Vitals stable. CBG being obtained. MD made aware. Awaiting new orders.

## 2020-05-20 NOTE — Progress Notes (Signed)
PROGRESS NOTE    [name] [name]  JKK:[id number] DOB: March 09, 1961 DOA: 05/12/2020 PCP: [name], NP   Chief Complaint  Patient presents with  . Fall    Brief Narrative:  59 year old male with history of hypertension, diabetes mellitus type 2, GERD/hiatal hernia, depression, COPD, alcohol abuse, hepatitis C status post treatment/?  Cirrhosis of liver presented with altered mental status and fall.  Patient was confused on presentation.  Per EMS, mother was concerned for alcohol withdrawal; reported last drink on Thursday.  On presentation, WBCs was 12, sodium of 124, potassium 3.6 with elevated AST/ALT/total bili with ammonia 50.  Chest x-ray showed right hilar opacity/fullness.  Head CT was showing moderate to severe left maxillary sinusitis.  Covid test was negative.  He was started on IV fluids and antibiotics.   Assessment & Plan:   Active Problems:   Hepatic cirrhosis (HCC)   Type 2 diabetes mellitus with hyperlipidemia (HCC)   ETOH abuse   Essential hypertension   DTs (delirium tremens) ([place])   Hyponatremia   Hypokalemia   PNA (pneumonia)   SIRS (systemic inflammatory response syndrome) (HCC)   Pneumonia   Community acquired pneumonia of right lower lobe of lung   Elevated LFTs   Hypophosphatemia   Acute metabolic encephalopathy   Acute maxillary sinusitis   Depression   Physical deconditioning  1 right lower lobe community-acquired pneumonia On presentation chest x-ray consistent with right hilar opacity/fullness.  CT abdomen showed a right lower lobe infiltrate.  Patient pancultured.  COVID-19 PCR negative.  Clinical improvement.  Patient with sats of 94% on room air.  Status post 5 days azithromycin.  IV Unasyn has been transitioned to Augmentin and patient has completed a 7-day course of antibiotic treatment.  Supportive care.  Outpatient follow-up.   2.  Acute metabolic encephalopathy Likely secondary to alcohol dependence/withdrawal versus hepatic  encephalopathy as patient noted to have ammonia level of 50 versus secondary to pneumonia.  Head CT done negative.  Clinical improvement.  Right upper quadrant ultrasound with hepatic steatosis.  Continue antibiotics, Ativan withdrawal protocol, thiamine, multivitamin.  Patient was started on lactulose which has been discontinued.  Supportive care.   3.  Hyponatremia Likely secondary to poor oral intake versus SIADH.  Sodium levels fluctuated and currently at 131.  Urine sodium 154, urine creatinine 73.42, urine osmolality 645.  Serum osmolality 270.  Uric acid level ordered and at 1.1.  Saline lock IV fluids.  Patient with urine output of 1100 cc over the past 24 hours.  Continue fluid restriction at 1500 cc/day. Follow.   4.  Hypokalemia/hypophosphatemia Electrolytes repleted.  Potassium of 4.1.  Magnesium at 2.5.  Follow.   5.  Alcohol abuse/alcohol withdrawal Currently stable.  Continue the Ativan withdrawal protocol, multivitamin, thiamine, folate.  Social work consulted.  Patient interested in alcohol cessation at this time.  Patient to be discharged to [facility] tomorrow 05/21/2020.   6.  Transaminitis/probable alcoholic hepatitis Right upper quadrant ultrasound with hepatic steatosis with no findings of cirrhosis.  LFTs fluctuating.  Outpatient follow-up.   7.  Maxillary sinusitis Status post 7 days antibiotics.  No further antibiotics needed.  8.  Hypertension Continue losartan, amlodipine.  Follow.   9.  Well-controlled diabetes mellitus type 2 Hemoglobin A1c 6.1.  CBG 204 this morning.  Continue to hold oral hypoglycemic agents and Invokana.  Increase Lantus to 8 units daily.  Sliding scale insulin.   10.  Depression Cymbalta.    11.  Generalized deconditioning PT/OT.  SNF  recommended initially however patient has progressed with physical therapy during the hospitalization and no longer requires SNF.   DVT prophylaxis: Heparin Code Status: Full Family Communication: Updated  patient.  No family at bedside.  Disposition:   Status is: Inpatient    Dispo: The patient is from: Home              Anticipated d/c is to: [facility]              Anticipated d/c date is: 05/21/2020               Patient currently medically stable.  Awaiting placement at [facility].       Consultants:   None  Procedures:   CT head without contrast 05/12/2020  CT abdomen and pelvis 05/13/2020  Chest x-ray 05/12/2020  Abdominal ultrasound 05/13/2020  Antimicrobials:  IV Unasyn 05/13/2020>>>> 05/16/2020  IV azithromycin 05/12/2020>>>>05/16/2020  IV Rocephin 9/12/ 2021x1 dose  Augmentin 05/16/2020>>>> 05/19/2020   Subjective: Patient sitting up in bed.  Noted to have just worked with physical therapy who feel patient does not need placement in SNF at this time and has improved clinically.  Patient denies any chest pain or shortness of breath.     Objective: Vitals:   05/20/20 0436 05/20/20 0500 05/20/20 0800 05/20/20 1017  BP: 129/75  125/70 128/79  Pulse: (!) 106  75 86  Resp:      Temp: 98 F (36.7 C)     TempSrc: Oral     SpO2: 95%     Weight:  72.9 kg    Height:        Intake/Output Summary (Last 24 hours) at 05/20/2020 1239 Last data filed at 05/20/2020 0930 Gross per 24 hour  Intake 480 ml  Output 1101 ml  Net -621 ml   Filed Weights   05/14/20 1542 05/17/20 0548 05/20/20 0500  Weight: 79.4 kg 77.9 kg 72.9 kg    Examination:  General exam: NAD. Respiratory system: Lungs clear to auscultation bilaterally.  No wheezes, no crackles, no rhonchi.  Normal respiratory effort.  Speaking in full sentences.  Cardiovascular system: Regular rate rhythm no murmurs rubs or gallops.  No JVD.  No lower extremity edema.  Gastrointestinal system: Abdomen is soft, nontender, nondistended, positive bowel sounds.  No rebound.  No guarding.   Central nervous system: Alert and oriented. No focal neurological deficits. Extremities: Symmetric 5 x 5 power. Skin: No rashes,  lesions or ulcers Psychiatry: Judgement and insight appear normal. Mood & affect appropriate.     Data Reviewed: I have personally reviewed following labs and imaging studies  CBC: Recent Labs  Lab 05/14/20 0420 05/15/20 0520 05/16/20 0556 05/18/20 0618 05/19/20 0624  WBC 8.3 7.6 8.2  --   --   NEUTROABS  --   --  5.9  --   --   HGB 11.2* 12.0* 12.5* 13.0 13.4  HCT 32.8* 35.3* 36.5* 38.9* 40.3  MCV 91.6 93.9 93.1  --   --   PLT 189 219 303  --   --     Basic Metabolic Panel: Recent Labs  Lab 05/15/20 0520 05/15/20 0520 05/16/20 0556 05/16/20 1331 05/17/20 0549 05/18/20 0618 05/19/20 0624  NA 130*   < > 126* 131* 127* 133* 131*  K 3.8   < > 4.3 4.0 3.8 3.8 4.1  CL 96*   < > 97* 96* 99 101 101  CO2 24   < > 22 23 20* 23 23  GLUCOSE 126*   < >  131* 133* 137* 167* 202*  BUN 9   < > 7 10 11 13 12   CREATININE 0.35*   < > 0.35* 0.40* 0.35* 0.52* 0.58*  CALCIUM 7.7*   < > 7.8* 8.5* 7.7* 8.3* 8.6*  MG 2.1  --  2.2  --  2.3 2.4 2.5*  PHOS 1.3*  --  2.2*  --  2.9 2.6  --    < > = values in this interval not displayed.    GFR: Estimated Creatinine Clearance: 105 mL/min (A) (by C-G formula based on SCr of 0.58 mg/dL (L)).  Liver Function Tests: Recent Labs  Lab 05/14/20 0420 05/15/20 0520 05/16/20 0556 05/17/20 0549 05/18/20 0618  AST 269* 195* 161* 159* 204*  ALT 104* 90* 86* 101* 154*  ALKPHOS 67 87 99 101 111  BILITOT 1.7* 1.6* 1.7* 1.9* 1.8*  PROT 5.9* 6.0* 6.5 6.5 7.0  ALBUMIN 2.3* 2.3* 2.5* 2.5* 2.8*    CBG: Recent Labs  Lab 05/19/20 1634 05/19/20 2049 05/19/20 2118 05/20/20 0740 05/20/20 1121  GLUCAP 244* 195* 220* 204* 208*     Recent Results (from the past 240 hour(s))  Blood culture (routine single)     Status: None   Collection Time: 05/12/20  9:20 PM   Specimen: BLOOD LEFT FOREARM  Result Value Ref Range Status   Specimen Description   Final    BLOOD LEFT FOREARM Performed at [facility], [place] [address],  [place], [place] [address]    Special Requests   Final    BOTTLES DRAWN AEROBIC AND ANAEROBIC Blood Culture adequate volume Performed at [place] [address] [place], [place] [address]    Culture   Final    NO GROWTH 5 DAYS Performed at [facility], [place] [address], [place], [place] [address]    Report Status 05/18/2020 FINAL  Final  SARS Coronavirus 2 by RT PCR (hospital order, performed in [facility] hospital lab) Nasopharyngeal Nasopharyngeal Swab     Status: None   Collection Time: 05/12/20  9:20 PM   Specimen: Nasopharyngeal Swab  Result Value Ref Range Status   SARS Coronavirus 2 NEGATIVE NEGATIVE Final    Comment: (NOTE) SARS-CoV-2 target nucleic acids are NOT DETECTED.  The SARS-CoV-2 RNA is generally detectable in upper and lower respiratory specimens during the acute phase of infection. The lowest concentration of SARS-CoV-2 viral copies this assay can detect is 250 copies / mL. A negative result does not preclude SARS-CoV-2 infection and should not be used as the sole basis for treatment or other patient management decisions.  A negative result may occur with improper specimen collection / handling, submission of specimen other than nasopharyngeal swab, presence of viral mutation(s) within the areas targeted by this assay, and inadequate number of viral copies (<250 copies / mL). A negative result must be combined with clinical observations, patient history, and epidemiological information.  Fact Sheet for Patients:   StrictlyIdeas.no  Fact Sheet for Healthcare Providers: BankingDealers.co.za  This test is not yet approved or  cleared by the [place] FDA and has been authorized for detection and/or diagnosis of SARS-CoV-2 by FDA under an Emergency Use Authorization (EUA).  This EUA will remain in effect (meaning this test can be used) for the duration of the COVID-19 declaration under  Section 564(b)(1) of the Act, 21 U.S.C. section 360bbb-3(b)(1), unless the authorization is terminated or revoked sooner.  Performed at [facility], [place] [address], [place],  [address]  Culture, blood (single)     Status: None   Collection Time: 05/12/20  9:20 PM   Specimen: BLOOD LEFT HAND  Result Value Ref Range Status   Specimen Description   Final    BLOOD LEFT HAND Performed at [place] [address] [place], [place] [address]    Special Requests   Final    BOTTLES DRAWN AEROBIC AND ANAEROBIC Blood Culture adequate volume Performed at [facility] [address] [place], [place] [address]    Culture   Final    NO GROWTH 5 DAYS Performed at [facility], [place] [address], [place], [place] [address]    Report Status 05/18/2020 FINAL  Final  Urine culture     Status: Abnormal   Collection Time: 05/13/20  9:09 AM   Specimen: In/Out Cath Urine  Result Value Ref Range Status   Specimen Description   Final    IN/OUT CATH URINE Performed at [facility] [address], [place], [place] [address]    Special Requests   Final    NONE Performed at [facility], [place] [address] [place], [place] [address]    Culture 2,000 COLONIES/mL STAPHYLOCOCCUS HAEMOLYTICUS (A)  Final   Report Status 05/15/2020 FINAL  Final   Organism ID, Bacteria STAPHYLOCOCCUS HAEMOLYTICUS (A)  Final      Susceptibility   Staphylococcus haemolyticus - MIC*    CIPROFLOXACIN <=0.5 SENSITIVE Sensitive     GENTAMICIN 2 SENSITIVE Sensitive     NITROFURANTOIN <=16 SENSITIVE Sensitive     OXACILLIN >=4 RESISTANT Resistant     TETRACYCLINE 4 SENSITIVE Sensitive     VANCOMYCIN 2 SENSITIVE Sensitive     TRIMETH/SULFA 160 RESISTANT Resistant     CLINDAMYCIN >=8 RESISTANT Resistant     RIFAMPIN <=0.5 SENSITIVE Sensitive     Inducible Clindamycin NEGATIVE Sensitive     * 2,000 COLONIES/mL  STAPHYLOCOCCUS HAEMOLYTICUS         Radiology Studies: No results found.      Scheduled Meds: . amLODipine  10 mg Oral Daily  . aspirin EC  81 mg Oral Daily  . atorvastatin  40 mg Oral Daily  . DULoxetine  30 mg Oral Daily  . enoxaparin (LOVENOX) injection  40 mg Subcutaneous Q24H  . folic acid  1 mg Oral Daily  . insulin aspart  0-15 Units Subcutaneous TID WC  . insulin glargine  5 Units Subcutaneous QHS  . loratadine  10 mg Oral Daily  . losartan  100 mg Oral Daily  . multivitamin with minerals  1 tablet Oral Daily  . pantoprazole  40 mg Oral BID  . thiamine  100 mg Oral Daily   Or  . thiamine  100 mg Intravenous Daily   Continuous Infusions:    LOS: 7 days    Time spent: 35 minutes    Irine Seal, MD [facility]   To contact the attending provider between 7A-7P or the covering provider during after hours 7P-7A, please log into the web site [email or web address] and access using universal [place] password for that web site. If you do not have the password, please call the hospital operator.  05/20/2020, 12:39 PM

## 2020-05-20 NOTE — TOC Progression Note (Addendum)
Transition of Care [facility]) - Progression Note    Patient Details  Name: KALVIN BUSS MRN: [id number] Date of Birth: Oct 31, 1960  [facility] CM/SW Contact  Kristeen Lantz, Marjie Skiff, RN Phone Number: 05/20/2020, 2:49 PM  Clinical Narrative:    This CM noticed improvement in pt physical mobility. PT recommendations were upgraded to no follow up. This CM had another discussion with pt about going to inpatient alcohol rehab. Pt has agreed at this time. This CM contacted [facility] as they take uninsured patients. This CM got pt an intake appointment for tomorrow morning. Pt has agreed and will need to be transported to [facility] by 9am. Initial paperwork done over the phone with the pt and June from [facility]. Mother to bring pt clothes, walker and medications to the hospital. Staff RN to call transport services for pt to be picked up first thing tomorrow am.   Expected Discharge Plan: [facility]hab) Barriers to Discharge: Continued Medical Work up  Expected Discharge Plan and Services Expected Discharge Plan: [facility])   Discharge Planning Services: CM Consult   Living arrangements for the past 2 months: Single Family Home Expected Discharge Date: 05/20/20                                     Social Determinants of Health (SDOH) Interventions    Readmission Risk Interventions No flowsheet data found.

## 2020-05-21 DIAGNOSIS — K2100 Gastro-esophageal reflux disease with esophagitis, without bleeding: Secondary | ICD-10-CM

## 2020-05-21 LAB — GLUCOSE, CAPILLARY: Glucose-Capillary: 228 mg/dL — ABNORMAL HIGH (ref 70–99)

## 2020-05-21 MED FILL — DULoxetine HCL 30 MG CPEP: 30 | 30 days supply | Qty: 30 | Fill #0

## 2020-05-21 MED FILL — !VENTOLIN HFA INHALER: 108 (90 BAS | 25 days supply | Qty: 18 | Fill #1

## 2020-05-22 ENCOUNTER — Telehealth: Payer: Self-pay

## 2020-05-22 ENCOUNTER — Encounter (HOSPITAL_BASED_OUTPATIENT_CLINIC_OR_DEPARTMENT_OTHER): Payer: Self-pay

## 2020-05-22 ENCOUNTER — Other Ambulatory Visit: Payer: Self-pay

## 2020-05-22 ENCOUNTER — Emergency Department (HOSPITAL_BASED_OUTPATIENT_CLINIC_OR_DEPARTMENT_OTHER)
Admission: EM | Admit: 2020-05-22 | Discharge: 2020-05-22 | Disposition: A | Discharge status: Home / Self Care | Payer: Self-pay | Attending: Emergency Medicine | Admitting: Emergency Medicine

## 2020-05-22 DIAGNOSIS — I10 Essential (primary) hypertension: Secondary | ICD-10-CM | POA: Insufficient documentation

## 2020-05-22 DIAGNOSIS — F17210 Nicotine dependence, cigarettes, uncomplicated: Secondary | ICD-10-CM | POA: Insufficient documentation

## 2020-05-22 DIAGNOSIS — E119 Type 2 diabetes mellitus without complications: Secondary | ICD-10-CM | POA: Insufficient documentation

## 2020-05-22 DIAGNOSIS — Z79899 Other long term (current) drug therapy: Secondary | ICD-10-CM | POA: Insufficient documentation

## 2020-05-22 DIAGNOSIS — Z7982 Long term (current) use of aspirin: Secondary | ICD-10-CM | POA: Insufficient documentation

## 2020-05-22 DIAGNOSIS — Z008 Encounter for other general examination: Secondary | ICD-10-CM | POA: Insufficient documentation

## 2020-05-22 LAB — BASIC METABOLIC PANEL
Anion gap: 10 (ref 5–15)
BUN: 18 mg/dL (ref 6–20)
CO2: 22 mmol/L (ref 22–32)
Calcium: 9.1 mg/dL (ref 8.9–10.3)
Chloride: 102 mmol/L (ref 98–111)
Creatinine, Ser: 0.79 mg/dL (ref 0.61–1.24)
GFR calc Af Amer: >60 mL/min (ref >60)
GFR calc non Af Amer: >60 mL/min (ref >60)
Glucose, Bld: 283 mg/dL — ABNORMAL HIGH (ref 70–99)
Potassium: 3.6 mmol/L (ref 3.5–5.1)
Sodium: 134 mmol/L — ABNORMAL LOW (ref 135–145)

## 2020-05-22 LAB — CBC WITH DIFFERENTIAL/PLATELET
Abs Immature Granulocytes: 0.05 10*3/uL (ref 0.00–0.07)
Basophils Absolute: 0.1 10*3/uL (ref 0.0–0.1)
Basophils Relative: 1 %
Eosinophils Absolute: 0.1 10*3/uL (ref 0.0–0.5)
Eosinophils Relative: 1 %
HCT: 39.6 % (ref 39.0–52.0)
Hemoglobin: 13 g/dL (ref 13.0–17.0)
Immature Granulocytes: 0 %
Lymphocytes Relative: 17 %
Lymphs Abs: 2 10*3/uL (ref 0.7–4.0)
MCH: 32 pg (ref 26.0–34.0)
MCHC: 32.8 g/dL (ref 30.0–36.0)
MCV: 97.5 fL (ref 80.0–100.0)
Monocytes Absolute: 1.4 10*3/uL — ABNORMAL HIGH (ref 0.1–1.0)
Monocytes Relative: 12 %
Neutro Abs: 7.9 10*3/uL — ABNORMAL HIGH (ref 1.7–7.7)
Neutrophils Relative %: 69 %
Platelets: 452 10*3/uL — ABNORMAL HIGH (ref 150–400)
RBC: 4.06 MIL/uL — ABNORMAL LOW (ref 4.22–5.81)
RDW: 15.1 % (ref 11.5–15.5)
WBC: 11.6 10*3/uL — ABNORMAL HIGH (ref 4.0–10.5)
nRBC: 0 % (ref 0.0–0.2)

## 2020-05-22 NOTE — Telephone Encounter (Signed)
NOTED

## 2020-05-22 NOTE — ED Notes (Signed)
Recently DC from hospital with pneumonia and complications of alcohol, was admitted for several weeks, here today due to his sodium levels being low, states he has had low bp readings as well. State he has been staying in a Hotel, but is trying to get into [facility] for alcohol abuse, last alcohol intake was sept 7th. No abdominal pain, no Nausea or Vomiting, states he had 4 BMs today

## 2020-05-22 NOTE — Discharge Instructions (Addendum)
Patient's vitals are stable in the ER. BP 120/79 with heart rate of 90. Patient may take his Prilosec in place of Protonix. Patient does not need to take the antibiotic in his bag. Fluid restriction lifted- discussed this with patient, he will self monitor and be judicious with his intake.

## 2020-05-22 NOTE — ED Provider Notes (Signed)
[facility] EMERGENCY DEPARTMENT Provider Note   CSN: [id number] Arrival date & time: 05/22/20  1549     History Chief Complaint  Patient presents with   Abnormal Lab    [name] [name] is a 59 y.o. male.  59 year old male presents for medical clearance for [facility]. Patient was discharged from the hospital yesterday with plan to go to [facility] today for admission for treatment for alcohol addiction. Patient states on arrival there were problems with his paperwork and vital signs and he was advised to go to the ER for evaluation.   Needs the following addressed to go to [facility]- 1) Prilosec not Protonix (states he takes Prilosec and does not need Protonix) 2) fluid restriction- 1500 cc fluid restriction and facility is unable to monitor this. (Patient would like this lifted)  3) BP low with tachycardia on arrival at [facility]. (states he felt fine when these vitals were recorded, vitals normal in the ER today) 4) antibiotic in the bag but not on med rec (states he had this from when he had stitches earlier in the month and was then admitted to the hospital and did not need to take them)        Past Medical History:  Diagnosis Date   Barrett's esophagus dx 2016   Bronchitis    Depression    Diabetes [facility])    ED (erectile dysfunction)    GERD (gastroesophageal reflux disease)    Hepatitis C    Hiatal hernia 10/2014   3cm   Neuropathy     Patient Active Problem List   Diagnosis Date Noted   Community acquired pneumonia of right lower lobe of lung    Elevated LFTs    Hypophosphatemia    Acute metabolic encephalopathy    Acute maxillary sinusitis    Depression    Physical deconditioning    Pneumonia 05/14/2020   DTs (delirium tremens) ([place]) 05/13/2020   Hyponatremia 05/13/2020   Hypokalemia 05/13/2020   PNA (pneumonia) 05/13/2020   SIRS (systemic inflammatory response syndrome) ([place]) 05/13/2020   Sepsis with acute hypoxic respiratory  failure without septic shock ([place])    Alcohol abuse with alcohol-induced mood disorder ([place]) 12/17/2017   Hereditary hemochromatosis ([place]) 11/23/2017   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   Essential hypertension 04/02/2017   Hypogonadism male 08/13/2016   ETOH abuse 06/22/2016   Low serum testosterone level 01/08/2016   Type 2 diabetes mellitus with hyperlipidemia ([place]) 01/02/2016   Erectile dysfunction 09/12/2015   Hepatic cirrhosis ([place]) 01/10/2015   Chronic hepatitis C without hepatic coma ([place]) 10/31/2014   Tobacco use disorder 08/03/2013    Past Surgical History:  Procedure Laterality Date   APPENDECTOMY     COLONOSCOPY N/A 02/16/2014   Procedure: COLONOSCOPY;  Surgeon: Gatha Mayer, MD;  Location: [facility];  Service: Endoscopy;  Laterality: N/A;   COLONOSCOPY     TONSILLECTOMY         Family History  Problem Relation Age of Onset   Cancer Mother        breast   Diabetes Mother    Heart disease Maternal Grandfather    Pancreatic cancer Paternal Grandmother    Stroke Father    Alcohol abuse Father    Pancreatic cancer Father    Colon cancer Neg Hx    Stomach cancer Neg Hx    Rectal cancer Neg Hx    Esophageal cancer Neg Hx     Social History   Tobacco Use  Smoking status: Current Every Day Smoker    Packs/day: 1.00    Years: 35.00    Pack years: 35.00    Types: Cigarettes   Smokeless tobacco: Never Used   Tobacco comment: none x 3 weeks as of 05/22/2020  Vaping Use   Vaping Use: Never used  Substance Use Topics   Alcohol use: Yes    Comment: daily   Drug use: No    Home Medications Prior to Admission medications   Medication Sig Start Date End Date Taking? Authorizing Provider  albuterol (VENTOLIN HFA) 108 (90 Base) MCG/ACT inhaler Inhale 2 puffs into the lungs every 6 (six) hours as needed for wheezing or shortness of breath. 06/16/19   [name], NP  amLODipine (NORVASC) 10 MG  tablet Take 1 tablet (10 mg total) by mouth daily. 12/19/19 05/12/20  [name], NP  aspirin EC 81 MG tablet Take 1 tablet (81 mg total) by mouth daily. 04/02/17   Funches, Adriana Mccallum, MD  atorvastatin (LIPITOR) 40 MG tablet Take 1 tablet (40 mg total) by mouth daily. 12/19/19   [name], NP  canagliflozin (INVOKANA) 300 MG TABS tablet Take 1 tablet (300 mg total) by mouth daily before breakfast. 09/18/19   [name], NP  DULoxetine (CYMBALTA) 30 MG capsule Take 1 capsule (30 mg total) by mouth daily. 05/20/20   Eugenie Filler, MD  folic acid (FOLVITE) 1 MG tablet Take 1 tablet (1 mg total) by mouth daily. 05/21/20   Eugenie Filler, MD  glipiZIDE (GLUCOTROL) 5 MG tablet TAKE 1 TABLET (5 MG TOTAL) BY MOUTH DAILY BEFORE BREAKFAST. 05/20/20 08/18/20  [name], NP  loperamide (IMODIUM A-D) 2 MG tablet Take 1 tablet (2 mg total) by mouth 4 (four) times daily as needed for diarrhea or loose stools. 03/01/19   [name], NP  loratadine (CLARITIN) 10 MG tablet Take 1 tablet (10 mg total) by mouth daily. 05/21/20   Eugenie Filler, MD  losartan (COZAAR) 100 MG tablet Take 1 tablet (100 mg total) by mouth daily. 12/19/19   [name], NP  mirtazapine (REMERON) 15 MG tablet Take 1 tablet (15 mg total) by mouth at bedtime. 05/01/20   Mayers, Cari S, PA-C  Multiple Vitamins-Minerals (MULTIVITAMIN ADULT) TABS Take 1 tablet by mouth daily. 04/02/17   Funches, Adriana Mccallum, MD  mupirocin ointment (BACTROBAN) 2 % Apply to affected areas twice per day. Patient will pick up scripts today. 03/14/19   [name], NP  ondansetron (ZOFRAN) 4 MG tablet Take 1 tablet (4 mg total) by mouth every 8 (eight) hours as needed for nausea or vomiting. 04/08/20   Mesner, Corene Cornea, MD  pantoprazole (PROTONIX) 40 MG tablet Take 1 tablet (40 mg total) by mouth daily. 05/20/20   Eugenie Filler, MD  thiamine 100 MG tablet Take 1 tablet (100 mg total) by mouth daily. 05/21/20   Eugenie Filler, MD     Allergies    Bee venom and Lactose intolerance (gi)  Review of Systems   Review of Systems  Constitutional: Negative for fever.  Respiratory: Negative for shortness of breath.   Cardiovascular: Negative for chest pain.  Gastrointestinal: Negative for abdominal pain, constipation, diarrhea, nausea and vomiting.  Skin: Negative for rash and wound.  Neurological: Negative for dizziness, weakness and light-headedness.  Psychiatric/Behavioral: Negative for confusion.  All other systems reviewed and are negative.   Physical Exam Updated Vital Signs BP 133/79 (BP Location: Right Arm)  Pulse 90    Temp 97.9 F (36.6 C) (Oral)    Resp 18    Ht 5\' 11"  (1.803 m)    Wt 73 kg    SpO2 99%    BMI 22.45 kg/m   Physical Exam Vitals and nursing note reviewed.  Constitutional:      General: He is not in acute distress.    Appearance: He is well-developed. He is not diaphoretic.  HENT:     Head: Normocephalic and atraumatic.  Cardiovascular:     Rate and Rhythm: Normal rate and regular rhythm.     Pulses: Normal pulses.     Heart sounds: Normal heart sounds.  Pulmonary:     Effort: Pulmonary effort is normal.     Breath sounds: Normal breath sounds.  Abdominal:     Palpations: Abdomen is soft.     Tenderness: There is no abdominal tenderness.  Musculoskeletal:     Right lower leg: No edema.     Left lower leg: No edema.  Skin:    General: Skin is warm and dry.     Findings: No erythema or rash.  Neurological:     Mental Status: He is alert and oriented to person, place, and time.  Psychiatric:        Behavior: Behavior normal.     ED Results / Procedures / Treatments   Labs (all labs ordered are listed, but only abnormal results are displayed) Labs Reviewed  CBC WITH DIFFERENTIAL/PLATELET - Abnormal; Notable for the following components:      Result Value   WBC 11.6 (*)    RBC 4.06 (*)    Platelets 452 (*)    Neutro Abs 7.9 (*)    Monocytes Absolute 1.4 (*)    All  other components within normal limits  BASIC METABOLIC PANEL - Abnormal; Notable for the following components:   Sodium 134 (*)    Glucose, Bld 283 (*)    All other components within normal limits    EKG None  Radiology No results found.  Procedures Procedures (including critical care time)  Medications Ordered in ED Medications - No data to display  ED Course  I have reviewed the triage vital signs and the nursing notes.  Pertinent labs & imaging results that were available during my care of the patient were reviewed by me and considered in my medical decision making (see chart for details).  Clinical Course as of May 22 1748  Wed May 22, 1814  5310 59 year old male with request as above for clearance to go to [facility]. Reports last alchol intake was 05/07/20.  Reviewed each point with patient. Will change Protonix to Prilosec which he has with his and does not need rx. Vitals stable in the ER. Discussed fluid restriction with patient, facility can not restrict fluid intake, patient is agreeable to self monitor this. Antibiotic in bag is left over and he does not need this.  Discussed patient's hyperglycemia with him, he reports compliance with his diabetes medications and states he drank apply juice today which likely caused his increased glucose.    [LM]    Clinical Course User Index [LM] Roque Lias   MDM Rules/Calculators/A&P                          Final Clinical Impression(s) / ED Diagnoses Final diagnoses:  Medical clearance for psychiatric admission    Rx / DC Orders ED Discharge  Orders    None       Roque Lias 05/22/20 1749    Malvin Johns, MD 05/22/20 [phone]

## 2020-05-22 NOTE — ED Triage Notes (Addendum)
Pt states he was advised to come to ED r/t "low sodium" from labs drawn 9/19-NAD-to triage with rollater

## 2020-05-22 NOTE — Telephone Encounter (Signed)
Transition Care Management Follow-up Telephone Call Date of discharge and from where: 05/21/2020, [facility] .  Call placed to patient and he said he was at [facility].  He explained that he went there yesterday and his meds were mixed up.  He left and stayed at a motel last night and is back at [facility] this morning. He said that the issues with his meds have been straightened out and now he is just waiting to be instructed about the next step.  A bed is supposed to be available for him. He is anxious to receive treatment.   He is aware that he has an appointment with Ms Raul Del, NP 06/14/2020.   He wanted to inform him PCP that he was seen by the ophthalmologist for his first exam and he goes back for the next appointment 08/01/2020.    He has the phone number for [facility] to call if he has questions/concerns.

## 2020-05-22 NOTE — Telephone Encounter (Signed)
Message received from patient that [name] will not accept him.  There are issues with his medications and his low BP.  Call placed to [facility] # [phone]. This CM spoke to [place], Therapist, sports.  She confirmed that there are multiple concerns about the patient. They are not a medical facility and not able to accept him at this time. When he is medically stable they can consider acceptance.   The concerns are:  He is on a 1500 cc/day fluid restriction and they are not able to monitor that.  The fluid restriction would need to be d/c'd  Protonix is ordered per his hospital discharge AVS and he does not have any. He will need to obtain a 30 day supply.      He has an antibiotic with him that has been filled but there is no antibiotic ordered on the AVS.  Is he supposed to be taking this?  His BP was very low when he was there this morning : 73/54 HR 111; 64/47 HR 117; 85/55 HR 111; 75/53 HR 114. Suanne Marker took his BP again while speaking to this CM this afternoon.and it was 106/75  HR 97. She said that he has been drinking multiple glasses of water.   This CM discussed these concerns with patient's PCP - [name], NP and it was determined that it would be best for the patient to be assessed in Urgent Care as there has been a significant change in his BP since he was discharged from the hospital yesterday and he needs to be medically stable to be admitted to [facility].   This CM spoke to [place], [facility] and informed her of recommendation from Ms Raul Del, NP.  Suanne Marker said she would inform the patient of the recommendation to go to Urgent Care to be assessed and when he is medically stable he can return to [facility] to discuss admission .

## 2020-05-22 NOTE — Telephone Encounter (Signed)
Noted. Agree with advice given.

## 2020-05-23 ENCOUNTER — Telehealth (INDEPENDENT_AMBULATORY_CARE_PROVIDER_SITE_OTHER): Payer: Self-pay | Admitting: Nurse Practitioner

## 2020-05-23 NOTE — Telephone Encounter (Signed)
This message has been addressed.

## 2020-05-23 NOTE — Telephone Encounter (Signed)
Copied from [place] [phone]. Topic: Quick Communication - See Telephone Encounter >> May 22, 2020 10:52 AM Loma [place] wrote: CRM for notification. See Telephone encounter for: 05/22/20.CB to nurse @ [facility] regarding referred to them after discharge from ED and they had to turn away due to an issue of his meds, need a call asap in order to treat  [place] >> May 22, 2020  1:22 PM Lennox Solders wrote: Pt is calling and would like zelda nurse to call him back concerning getting into [facility] . Pt states because his bp is to low in 70 the bottom number and he is on stomach medication protonix and also he is on 1500 cc liquid they will not accept him. Pt does not know his actually BP number he will get and when the nurse calls him back he will have that reading

## 2020-06-03 ENCOUNTER — Telehealth: Payer: Self-pay | Admitting: *Deleted

## 2020-06-03 ENCOUNTER — Telehealth: Payer: Self-pay | Admitting: Nurse Practitioner

## 2020-06-03 NOTE — Telephone Encounter (Signed)
Copied from [place] [phone]. Topic: General - Inquiry >> Jun 03, 2020  3:36 PM Gillis Ends D wrote: Reason for CRM: Patient would like for the nurse to give him a call back, he is trying to get in a program and he needs the nurse to send a few things to the program. He can be reached at [phone]. Please advise

## 2020-06-03 NOTE — Telephone Encounter (Signed)
Copied from [place] [phone]. Topic: General - Other >> Jun 03, 2020 11:59 AM Rainey Pines A wrote: Pt requesting ccallback from [place] and did not wish to go into detail

## 2020-06-03 NOTE — Telephone Encounter (Signed)
Will route to CMA 

## 2020-06-03 NOTE — Telephone Encounter (Signed)
Copied from [place] [phone]. Topic: General - Other >> Jun 03, 2020 11:59 AM Rainey Pines A wrote: Pt requesting ccallback from [place] and did not wish to go into detail >> Jun 03, 2020  4:44 PM Erick Blinks wrote: Pt needs A1C and double pneumonia discharge release faxed over  Faxed over: [place]  Attn: Intake [place]  Fax: [phone] >> Jun 03, 2020  4:42 PM Erick Blinks wrote: Pt called back, wants to speak with nurse regarding 2 year rehab program. Says this is time sensitive

## 2020-06-04 NOTE — Telephone Encounter (Signed)
Copied from [place] [id number]. Topic: General - Other >> Jun 04, 2020  8:50 AM Keene Breath wrote: Reason for CRM: Patient requests that the nurse call him regarding his message he sent yesterday.  He would like to discuss some discharge info. And AIC record.  CB# [phone]

## 2020-06-04 NOTE — Telephone Encounter (Signed)
Bien please call patient regarding lab results and rehab questions. Thank you.

## 2020-06-04 NOTE — Telephone Encounter (Signed)
Attempted to reach patient. Patient answer and the call disconnected. Attempted to call patient back again, no answer and LVM.

## 2020-06-05 NOTE — Telephone Encounter (Signed)
Spoke with pt concerns adressed

## 2020-06-05 NOTE — Telephone Encounter (Signed)
Copied from [place] [phone]. Topic: General - Other >> Jun 05, 2020  8:43 AM Celene Kras wrote: Reason for CRM: Pt called and is requesting to have a call back regarding his recent lab work and hospital stay. He is requesting to speak with nurse. Please advise.

## 2020-06-06 NOTE — Telephone Encounter (Signed)
Spoke to [name] from the [employer] They would like [place] to fax over his last A1C result, Clearance for Pneumonia that can include his last treatment for it or X-Ray. They also need a letter regarding patient's shoulder and back if he is able to work or any work restriction, the program requires work like yard work and house work.  They would like patient to have two Epi-Pen for his allergies w/ Bees.

## 2020-06-10 ENCOUNTER — Encounter: Payer: Self-pay | Admitting: Nurse Practitioner

## 2020-06-10 ENCOUNTER — Other Ambulatory Visit: Payer: Self-pay | Admitting: Nurse Practitioner

## 2020-06-10 MED ORDER — EPINEPHRINE 0.3 MG/0.3ML IJ SOAJ: 0.3000 mg | INTRAMUSCULAR | 0 refills | Status: DC | PRN

## 2020-06-10 NOTE — Telephone Encounter (Signed)
Letter is in chart and EPI PENs have been filled. Bien please send the other requested information from his chart. Thank you

## 2020-06-11 ENCOUNTER — Telehealth: Payer: Self-pay | Admitting: Nurse Practitioner

## 2020-06-11 MED FILL — EPINEPHRINE 0.3 MG AUTO-INJ: 0.3 | 2 days supply | Qty: 2 | Fill #0

## 2020-06-11 NOTE — Telephone Encounter (Signed)
Copied from [place] [phone]. Topic: General - Other >> Jun 11, 2020 12:19 PM Celene Kras wrote: Reason for CRM: Pt calling to speak with nurse regarding his upcoming appt and paperwork he is needing filled out. Please advise .

## 2020-06-12 ENCOUNTER — Telehealth: Payer: Self-pay | Admitting: Nurse Practitioner

## 2020-06-12 NOTE — Telephone Encounter (Signed)
Spoke to [name]. He stated he is going to another rehab facility hoping they will take him. [facility] did not accept him due to his history of Cirrhosis.

## 2020-06-12 NOTE — Telephone Encounter (Signed)
Pt. Stated [facility] would not accept him due to him having history of Cirrhosis.  Pt. Stated he is going to try to go to [facility]. Pt. Stated he is going to drop off forms for PCP to fill out.

## 2020-06-12 NOTE — Telephone Encounter (Signed)
Copied from [place] [phone]. Topic: Quick Communication - See Telephone Encounter >> Jun 11, 2020  4:13 PM Loma [place] wrote: CRM for notification. See Telephone encounter for: 06/11/20.Pls Fu with Pt  [phone] Pt needs a CB from [facility] nurse re a rehab program and needs advice

## 2020-06-13 ENCOUNTER — Encounter: Payer: Self-pay | Admitting: Nurse Practitioner

## 2020-06-14 ENCOUNTER — Other Ambulatory Visit: Payer: Self-pay | Admitting: Nurse Practitioner

## 2020-06-14 ENCOUNTER — Encounter: Payer: Self-pay | Admitting: Nurse Practitioner

## 2020-06-14 ENCOUNTER — Other Ambulatory Visit: Payer: Self-pay

## 2020-06-14 ENCOUNTER — Ambulatory Visit: Payer: Self-pay | Attending: Family Medicine | Admitting: Nurse Practitioner

## 2020-06-14 DIAGNOSIS — E1142 Type 2 diabetes mellitus with diabetic polyneuropathy: Secondary | ICD-10-CM

## 2020-06-14 DIAGNOSIS — R195 Other fecal abnormalities: Secondary | ICD-10-CM

## 2020-06-14 DIAGNOSIS — R21 Rash and other nonspecific skin eruption: Secondary | ICD-10-CM

## 2020-06-14 DIAGNOSIS — I10 Essential (primary) hypertension: Secondary | ICD-10-CM

## 2020-06-14 DIAGNOSIS — K2100 Gastro-esophageal reflux disease with esophagitis, without bleeding: Secondary | ICD-10-CM

## 2020-06-14 DIAGNOSIS — IMO0002 Reserved for concepts with insufficient information to code with codable children: Secondary | ICD-10-CM

## 2020-06-14 DIAGNOSIS — M2550 Pain in unspecified joint: Secondary | ICD-10-CM

## 2020-06-14 DIAGNOSIS — E785 Hyperlipidemia, unspecified: Secondary | ICD-10-CM

## 2020-06-14 DIAGNOSIS — F10982 Alcohol use, unspecified with alcohol-induced sleep disorder: Secondary | ICD-10-CM

## 2020-06-14 DIAGNOSIS — E1140 Type 2 diabetes mellitus with diabetic neuropathy, unspecified: Secondary | ICD-10-CM

## 2020-06-14 DIAGNOSIS — E1165 Type 2 diabetes mellitus with hyperglycemia: Secondary | ICD-10-CM

## 2020-06-14 DIAGNOSIS — J302 Other seasonal allergic rhinitis: Secondary | ICD-10-CM

## 2020-06-14 MED ORDER — OMEPRAZOLE 40 MG PO CPDR: 40.0000 mg | DELAYED_RELEASE_CAPSULE | Freq: Every day | ORAL | 1 refills | Status: DC

## 2020-06-14 MED ORDER — LOPERAMIDE HCL 2 MG PO TABS: 2.0000 mg | ORAL_TABLET | Freq: Four times a day (QID) | ORAL | 3 refills | Status: DC | PRN

## 2020-06-14 MED ORDER — DULOXETINE HCL 60 MG PO CPEP: 60.0000 mg | ORAL_CAPSULE | Freq: Every day | ORAL | 1 refills | Status: DC

## 2020-06-14 MED ORDER — MELOXICAM 7.5 MG PO TABS: 7.5000 mg | ORAL_TABLET | Freq: Every day | ORAL | 0 refills | Status: DC

## 2020-06-14 MED ORDER — MUPIROCIN 2 % EX OINT: TOPICAL_OINTMENT | CUTANEOUS | 1 refills | Status: DC

## 2020-06-14 MED ORDER — THIAMINE HCL 100 MG PO TABS: 100.0000 mg | ORAL_TABLET | Freq: Every day | ORAL | 1 refills | Status: AC

## 2020-06-14 MED ORDER — MIRTAZAPINE 15 MG PO TABS: 15.0000 mg | ORAL_TABLET | Freq: Every day | ORAL | 1 refills | Status: DC

## 2020-06-14 MED ORDER — CANAGLIFLOZIN 300 MG PO TABS: 300.0000 mg | ORAL_TABLET | Freq: Every day | ORAL | 1 refills | Status: DC

## 2020-06-14 MED ORDER — AMLODIPINE BESYLATE 10 MG PO TABS: 10.0000 mg | ORAL_TABLET | Freq: Every day | ORAL | 1 refills | Status: DC

## 2020-06-14 MED ORDER — ATORVASTATIN CALCIUM 40 MG PO TABS: 40.0000 mg | ORAL_TABLET | Freq: Every day | ORAL | 2 refills | Status: DC

## 2020-06-14 MED ORDER — LORATADINE 10 MG PO TABS: 10.0000 mg | ORAL_TABLET | Freq: Every day | ORAL | 1 refills | Status: DC

## 2020-06-14 MED ORDER — GLIPIZIDE 5 MG PO TABS: 5.0000 mg | ORAL_TABLET | Freq: Every day | ORAL | 1 refills | Status: DC

## 2020-06-14 MED ORDER — LOSARTAN POTASSIUM 100 MG PO TABS: 100.0000 mg | ORAL_TABLET | Freq: Every day | ORAL | 1 refills | Status: DC

## 2020-06-14 NOTE — Telephone Encounter (Signed)
Spoke to patient. Patient wanting PCP to fill out form for him to be admitted to [facility] for [facility]hab.

## 2020-06-14 NOTE — Progress Notes (Signed)
Virtual Visit via Telephone Note Due to national recommendations of social distancing due to [place] 19, telehealth visit is felt to be most appropriate for this patient at this time.  I discussed the limitations, risks, security and privacy concerns of performing an evaluation and management service by telephone and the availability of in person appointments. I also discussed with the patient that there may be a patient responsible charge related to this service. The patient expressed understanding and agreed to proceed.    I connected with [name] [name] on 06/14/20  at   3:50 PM EDT  EDT by telephone and verified that I am speaking with the correct person using two identifiers.   Consent I discussed the limitations, risks, security and privacy concerns of performing an evaluation and management service by telephone and the availability of in person appointments. I also discussed with the patient that there may be a patient responsible charge related to this service. The patient expressed understanding and agreed to proceed.   Location of Patient: Private Residence   Location of Provider: [place] and [employer] Office    Persons participating in Telemedicine visit: [name] Rankins FNP-BC [place]    History of Present Illness: Telemedicine visit for: F/U  has a past medical history of Barrett's esophagus (dx 2016), Bronchitis, Depression, Diabetes ([place]), ED (erectile dysfunction), GERD (gastroesophageal reflux disease), Hepatitis C, Hiatal hernia (10/2014), and Neuropathy.   Requesting prescription ibuprofen for generalized arthralgias. Due to his history of ETOH dependency and high risk of gastritis I do not recommend that he take ibuprofen several times per day.   He is doing okay today. Awaiting approval for a long-term residential  Therapeutic treatment program at [facility] [place] which is for chronic substance abuse issues.   DM TYPE  2 Well controlled with invokana 300 mg daily, glipizide 5 mg daily. He is not monitoring his blood glucose levels. He takes cymbalta for neuropathic pain. On ARB and STATIN. LDL nearing goal.  Lab Results  Component Value Date   HGBA1C 6.1 (H) 05/13/2020   Lab Results  Component Value Date   LDLCALC 72 06/16/2019   Essential Hypertension Well controlled. Taking losartan 100 mg daily and amlodipine 10 mg daily as prescribed. Denies chest pain, shortness of breath, palpitations, lightheadedness, dizziness, headaches or BLE edema.  BP Readings from Last 3 Encounters:  05/22/20 133/79  05/21/20 (!) 143/81  05/05/20 (!) 162/74   Past Medical History:  Diagnosis Date  . Barrett's esophagus dx 2016  . Bronchitis   . Depression   . Diabetes ([place])   . ED (erectile dysfunction)   . GERD (gastroesophageal reflux disease)   . Hepatitis C   . Hiatal hernia 10/2014   3cm  . Neuropathy     Past Surgical History:  Procedure Laterality Date  . APPENDECTOMY    . COLONOSCOPY N/A 02/16/2014   Procedure: COLONOSCOPY;  Surgeon: Gatha Mayer, MD;  Location: [facility];  Service: Endoscopy;  Laterality: N/A;  . COLONOSCOPY    . TONSILLECTOMY      Family History  Problem Relation Age of Onset  . Cancer Mother        breast  . Diabetes Mother   . Heart disease Maternal Grandfather   . Pancreatic cancer Paternal Grandmother   . Stroke Father   . Alcohol abuse Father   . Pancreatic cancer Father   . Colon cancer Neg Hx   . Stomach cancer Neg Hx   .  Rectal cancer Neg Hx   . Esophageal cancer Neg Hx     Social History   Socioeconomic History  . Marital status: Single    Spouse name: Not on file  . Number of children: Not on file  . Years of education: Not on file  . Highest education level: Not on file  Occupational History  . Not on file  Tobacco Use  . Smoking status: Current Every Day Smoker    Packs/day: 1.00    Years: 35.00    Pack years: 35.00    Types: Cigarettes  .  Smokeless tobacco: Never Used  . Tobacco comment: none x 3 weeks as of 05/22/2020  Vaping Use  . Vaping Use: Never used  Substance and Sexual Activity  . Alcohol use: Yes    Comment: daily  . Drug use: No  . Sexual activity: Not on file  Other Topics Concern  . Not on file  Social History Narrative  . Not on file   Social Determinants of Health   Financial Resource Strain:   . Difficulty of Paying Living Expenses: Not on file  Food Insecurity:   . Worried About Charity fundraiser in the Last Year: Not on file  . Ran Out of Food in the Last Year: Not on file  Transportation Needs:   . Lack of Transportation (Medical): Not on file  . Lack of Transportation (Non-Medical): Not on file  Physical Activity:   . Days of Exercise per Week: Not on file  . Minutes of Exercise per Session: Not on file  Stress:   . Feeling of Stress : Not on file  Social Connections:   . Frequency of Communication with Friends and Family: Not on file  . Frequency of Social Gatherings with Friends and Family: Not on file  . Attends Religious Services: Not on file  . Active Member of Clubs or Organizations: Not on file  . Attends Archivist Meetings: Not on file  . Marital Status: Not on file     Observations/Objective: Awake, alert and oriented x 3   Review of Systems  Constitutional: Negative for fever, malaise/fatigue and weight loss.  HENT: Negative.  Negative for nosebleeds.   Eyes: Negative.  Negative for blurred vision, double vision and photophobia.  Respiratory: Negative.  Negative for cough and shortness of breath.   Cardiovascular: Negative.  Negative for chest pain, palpitations and leg swelling.  Gastrointestinal: Negative.  Negative for heartburn, nausea and vomiting.  Musculoskeletal: Negative.  Negative for myalgias.  Neurological: Negative.  Negative for dizziness, focal weakness, seizures and headaches.  Psychiatric/Behavioral: Positive for substance abuse (history of  ETOH dependence). Negative for suicidal ideas.    Assessment and Plan: [name] [name] was seen today for medication problem.  Diagnoses and all orders for this visit:  Essential hypertension -     losartan (COZAAR) 100 MG tablet; Take 1 tablet (100 mg total) by mouth daily. Please fill as a 90 day supply -     amLODipine (NORVASC) 10 MG tablet; Take 1 tablet (10 mg total) by mouth daily. Please fill as a 90 day supply Continue all antihypertensives as prescribed.  Remember to bring in your blood pressure log with you for your follow up appointment.  DASH/Mediterranean Diets are healthier choices for HTN.    Alcohol-induced insomnia (HCC) -     mirtazapine (REMERON) 15 MG tablet; Take 1 tablet (15 mg total) by mouth at bedtime. Please fill as a 90 day supply -  thiamine 100 MG tablet; Take 1 tablet (100 mg total) by mouth daily. Please fill as a 90 day supply  Uncontrolled diabetes mellitus with diabetic neuropathy (HCC) -     glipiZIDE (GLUCOTROL) 5 MG tablet; Take 1 tablet (5 mg total) by mouth daily before breakfast. Please fill as a 90 day supply -     canagliflozin (INVOKANA) 300 MG TABS tablet; Take 1 tablet (300 mg total) by mouth daily before breakfast. Please fill as a 90 day supply Continue blood sugar control as discussed in office today, low carbohydrate diet, and regular physical exercise as tolerated, 150 minutes per week (30 min each day, 5 days per week, or 50 min 3 days per week). Keep blood sugar logs with fasting goal of 90-130 mg/dl, post prandial (after you eat) less than 180.  For Hypoglycemia: BS <60 and Hyperglycemia BS >400; contact the clinic ASAP. Annual eye exams and foot exams are recommended.   Arthralgia of multiple joints -     meloxicam (MOBIC) 7.5 MG tablet; Take 1 tablet (7.5 mg total) by mouth daily. Please fill as a 90 day supply  Diabetic polyneuropathy associated with type 2 diabetes mellitus (HCC) -     DULoxetine (CYMBALTA) 60 MG capsule; Take 1  capsule (60 mg total) by mouth daily. Please fill as a 90 day supply  Dyslipidemia, goal LDL below 70 -     atorvastatin (LIPITOR) 40 MG tablet; Take 1 tablet (40 mg total) by mouth daily. Please fill as a 90 day supply. INSTRUCTIONS: Work on a low fat, heart healthy diet and participate in regular aerobic exercise program by working out at least 150 minutes per week; 5 days a week-30 minutes per day. Avoid red meat/beef/steak,  fried foods. junk foods, sodas, sugary drinks, unhealthy snacking, alcohol and smoking.  Drink at least 80 oz of water per day and monitor your carbohydrate intake daily.    Seasonal allergies -     loratadine (CLARITIN) 10 MG tablet; Take 1 tablet (10 mg total) by mouth daily. Please fill as a 90 day supply  Loose stools -     loperamide (IMODIUM A-D) 2 MG tablet; Take 1 tablet (2 mg total) by mouth 4 (four) times daily as needed for diarrhea or loose stools.  Gastroesophageal reflux disease with esophagitis without hemorrhage -     omeprazole (PRILOSEC) 40 MG capsule; Take 1 capsule (40 mg total) by mouth daily. Please fill as a 90 day supply INSTRUCTIONS: Avoid GERD Triggers: acidic, spicy or fried foods, caffeine, coffee, sodas,  alcohol and chocolate.   Skin rash -     mupirocin ointment (BACTROBAN) 2 %; Apply to affected areas twice per day.     Follow Up Instructions Return in about 3 months (around 09/14/2020).     I discussed the assessment and treatment plan with the patient. The patient was provided an opportunity to ask questions and all were answered. The patient agreed with the plan and demonstrated an understanding of the instructions.   The patient was advised to call back or seek an in-person evaluation if the symptoms worsen or if the condition fails to improve as anticipated.  I provided 19 minutes of non-face-to-face time during this encounter including median intraservice time, reviewing previous notes, labs, imaging, medications and  explaining diagnosis and management.  [name], FNP-BC

## 2020-06-14 NOTE — Telephone Encounter (Signed)
Pt. Will fax his form for PCP to fill out for his rehab admission.

## 2020-06-17 MED FILL — ?ATORVASTATIN 40MG TABLET: 40 | 30 days supply | Qty: 30 | Fill #0

## 2020-06-17 MED FILL — AMLODIPINE BESYLATE 10 MG T: 10 | 30 days supply | Qty: 30 | Fill #0

## 2020-06-17 MED FILL — INVOKANA 300 MG TABLET: 300 | 30 days supply | Qty: 30 | Fill #0

## 2020-06-17 MED FILL — LOSARTAN POTASSIUM 100 MG T: 100 | 30 days supply | Qty: 30 | Fill #0

## 2020-06-17 MED FILL — MELOXICAM 7.5 MG TABLET: 7.5 | 30 days supply | Qty: 30 | Fill #0

## 2020-06-17 MED FILL — MUPIROCIN 2% OINTMENT: 2 | 7 days supply | Qty: 44 | Fill #0

## 2020-06-17 MED FILL — ?GLIPIZIDE 5MG TABLET: 5 | 30 days supply | Qty: 30 | Fill #0

## 2020-06-17 MED FILL — MIRTAZAPINE 15 MG TABLET: 15 | 30 days supply | Qty: 30 | Fill #0

## 2020-06-17 MED FILL — ?DULOXetine HCL 60MG CAPS: 60 | 30 days supply | Qty: 30 | Fill #0

## 2020-06-17 MED FILL — OMEPRAZOLE DR 40 MG CAPSULE: 40 | 30 days supply | Qty: 30 | Fill #0

## 2020-06-18 ENCOUNTER — Encounter: Payer: Self-pay | Admitting: Nurse Practitioner

## 2020-06-18 MED FILL — MELOXICAM 7.5 MG TABLET: 7.5 | 60 days supply | Qty: 60 | Fill #1

## 2020-06-18 MED FILL — AMLODIPINE BESYLATE 10 MG T: 10 | 60 days supply | Qty: 60 | Fill #1

## 2020-06-18 MED FILL — LOSARTAN POTASSIUM 100 MG T: 100 | 60 days supply | Qty: 60 | Fill #1

## 2020-06-18 MED FILL — DULoxetine HCL 60 MG CPEP: 60 | 60 days supply | Qty: 60 | Fill #1

## 2020-06-18 MED FILL — OMEPRAZOLE DR 40 MG CAPSULE: 40 | 60 days supply | Qty: 60 | Fill #1

## 2020-06-18 MED FILL — MIRTAZAPINE 15 MG TABLET: 15 | 60 days supply | Qty: 60 | Fill #1

## 2020-06-18 MED FILL — glipiZIDE 5 MG TABS: 5 | 60 days supply | Qty: 60 | Fill #1

## 2020-06-18 MED FILL — INVOKANA 300 MG TABLET: 300 | 60 days supply | Qty: 60 | Fill #1

## 2020-06-18 MED FILL — ATORVASTATIN CALCIUM 40 MG: 40 | 60 days supply | Qty: 60 | Fill #1

## 2020-06-20 ENCOUNTER — Ambulatory Visit (HOSPITAL_BASED_OUTPATIENT_CLINIC_OR_DEPARTMENT_OTHER): Payer: Self-pay | Admitting: Pharmacist

## 2020-06-20 ENCOUNTER — Other Ambulatory Visit: Payer: Self-pay

## 2020-06-20 ENCOUNTER — Ambulatory Visit: Payer: Self-pay | Attending: Nurse Practitioner

## 2020-06-20 DIAGNOSIS — I10 Essential (primary) hypertension: Secondary | ICD-10-CM

## 2020-06-20 DIAGNOSIS — Z130 Encounter for screening for diseases of the blood and blood-forming organs and certain disorders involving the immune mechanism: Secondary | ICD-10-CM

## 2020-06-20 DIAGNOSIS — E785 Hyperlipidemia, unspecified: Secondary | ICD-10-CM

## 2020-06-20 DIAGNOSIS — Z23 Encounter for immunization: Secondary | ICD-10-CM

## 2020-06-20 NOTE — Progress Notes (Signed)
Patient presents for vaccination against influenza per orders of Zelda. Consent given. Counseling provided. No contraindications exists. Vaccine administered without incident.  ° °Luke Van Ausdall, PharmD, CPP °Clinical Pharmacist °[facility] °[phone] ° °

## 2020-06-21 LAB — CBC
Hematocrit: 43.9 % (ref 37.5–51.0)
Hemoglobin: 14.9 g/dL (ref 13.0–17.7)
MCH: 32 pg (ref 26.6–33.0)
MCHC: 33.9 g/dL (ref 31.5–35.7)
MCV: 94 fL (ref 79–97)
Platelets: 310 10*3/uL (ref 150–450)
RBC: 4.66 x10E6/uL (ref 4.14–5.80)
RDW: 12.4 % (ref 11.6–15.4)
WBC: 12 10*3/uL — ABNORMAL HIGH (ref 3.4–10.8)

## 2020-06-21 LAB — LIPID PANEL
Chol/HDL Ratio: 3 ratio (ref 0.0–5.0)
Cholesterol, Total: 119 mg/dL (ref 100–199)
HDL: 40 mg/dL (ref >39)
LDL Chol Calc (NIH): 62 mg/dL (ref 0–99)
Triglycerides: 88 mg/dL (ref 0–149)
VLDL Cholesterol Cal: 17 mg/dL (ref 5–40)

## 2020-06-21 LAB — CMP14+EGFR
ALT: 59 IU/L — ABNORMAL HIGH (ref 0–44)
AST: 57 IU/L — ABNORMAL HIGH (ref 0–40)
Albumin/Globulin Ratio: 1.3 (ref 1.2–2.2)
Albumin: 4.4 g/dL (ref 3.8–4.9)
Alkaline Phosphatase: 108 IU/L (ref 44–121)
BUN/Creatinine Ratio: 20 (ref 9–20)
BUN: 14 mg/dL (ref 6–24)
Bilirubin Total: 0.4 mg/dL (ref 0.0–1.2)
CO2: 21 mmol/L (ref 20–29)
Calcium: 9.5 mg/dL (ref 8.7–10.2)
Chloride: 98 mmol/L (ref 96–106)
Creatinine, Ser: 0.69 mg/dL — ABNORMAL LOW (ref 0.76–1.27)
GFR calc Af Amer: 121 mL/min/{1.73_m2} (ref >59)
GFR calc non Af Amer: 105 mL/min/{1.73_m2} (ref >59)
Globulin, Total: 3.4 g/dL (ref 1.5–4.5)
Glucose: 301 mg/dL — ABNORMAL HIGH (ref 65–99)
Potassium: 3.8 mmol/L (ref 3.5–5.2)
Sodium: 135 mmol/L (ref 134–144)
Total Protein: 7.8 g/dL (ref 6.0–8.5)

## 2020-06-24 ENCOUNTER — Other Ambulatory Visit: Payer: Self-pay

## 2020-06-25 ENCOUNTER — Ambulatory Visit: Payer: Self-pay | Admitting: *Deleted

## 2020-06-25 NOTE — Telephone Encounter (Addendum)
Patient checking on the status of message below and would like a follow up call today best # [phone]

## 2020-06-25 NOTE — Telephone Encounter (Signed)
error 

## 2020-06-26 ENCOUNTER — Telehealth: Payer: Self-pay | Admitting: Nurse Practitioner

## 2020-06-26 NOTE — Telephone Encounter (Signed)
Spoke to patient and informed CMA faxed the form to [facility].

## 2020-06-26 NOTE — Telephone Encounter (Signed)
Spoke to patient and informed Form was faxed to [facility].  Pt. Understood.

## 2020-06-26 NOTE — Telephone Encounter (Signed)
Copied from [place] [phone]. Topic: General - Other >> Jun 25, 2020  1:50 PM Keene Breath wrote: Reason for CRM: Patient would like the nurse to call him at [phone], regarding a fax.  Please advise. >> Jun 26, 2020 12:15 PM Keene Breath wrote: Patient is calling again to check the status of two forms that he needs sent to [facility].  Patient stated that it needs to be completed with his medication information and the doctor needs to sign it.  Please advise and call patient to give him an update.  CB# [phone]

## 2020-07-04 ENCOUNTER — Telehealth: Payer: Self-pay | Admitting: Nurse Practitioner

## 2020-07-04 NOTE — Telephone Encounter (Signed)
Attempt to reach patient to inform CMA had faxed the form on  06/26/2020. No answer and LVM.

## 2020-07-04 NOTE — Telephone Encounter (Signed)
Copied from [place] [phone]. Topic: General - Other >> Jul 04, 2020  1:22 PM Celene Kras wrote: Reason for CRM: Pt called stating that he received a call from Beverlee Nims and is needing to speak with her regarding a denial. PT states that his white blood cell count was high and he was advised to have tests run again. Please advise.

## 2020-07-04 NOTE — Telephone Encounter (Signed)
Attempt to reach patient. No answer and LVM.

## 2020-07-05 ENCOUNTER — Telehealth: Payer: Self-pay

## 2020-07-05 DIAGNOSIS — Z130 Encounter for screening for diseases of the blood and blood-forming organs and certain disorders involving the immune mechanism: Secondary | ICD-10-CM

## 2020-07-05 NOTE — Telephone Encounter (Signed)
Pt returned call, tried calling office. Please advise

## 2020-07-05 NOTE — Telephone Encounter (Signed)
Called pt / see notes in epic

## 2020-07-05 NOTE — Telephone Encounter (Signed)
Spoke to patient and placed the CBC order in future. Pt. Is aware he need to come back in blood work repeat.

## 2020-07-05 NOTE — Telephone Encounter (Signed)
   Called pt made aware of NP result note and instructions. Verbalized understanding Glucose continues elevated. Liver function has improved. Kidney function and electrolytes are essentially normal.White blood count elevated. Will need to repeat in 2-3 weeks

## 2020-07-05 NOTE — Addendum Note (Signed)
Addended byMariane Baumgarten on: 07/05/2020 12:19 PM   Modules accepted: Orders

## 2020-07-11 ENCOUNTER — Other Ambulatory Visit: Payer: Self-pay

## 2020-07-11 ENCOUNTER — Ambulatory Visit: Payer: Self-pay | Attending: Nurse Practitioner

## 2020-07-11 DIAGNOSIS — Z130 Encounter for screening for diseases of the blood and blood-forming organs and certain disorders involving the immune mechanism: Secondary | ICD-10-CM

## 2020-07-12 LAB — CBC
Hematocrit: 43.9 % (ref 37.5–51.0)
Hemoglobin: 14.5 g/dL (ref 13.0–17.7)
MCH: 30.7 pg (ref 26.6–33.0)
MCHC: 33 g/dL (ref 31.5–35.7)
MCV: 93 fL (ref 79–97)
Platelets: 267 10*3/uL (ref 150–450)
RBC: 4.72 x10E6/uL (ref 4.14–5.80)
RDW: 11.9 % (ref 11.6–15.4)
WBC: 11.3 10*3/uL — ABNORMAL HIGH (ref 3.4–10.8)

## 2020-09-13 ENCOUNTER — Ambulatory Visit (HOSPITAL_COMMUNITY)
Admission: EM | Admit: 2020-09-13 | Discharge: 2020-09-13 | Disposition: A | Discharge status: Home / Self Care | Payer: HRSA Program | Attending: Emergency Medicine | Admitting: Emergency Medicine

## 2020-09-13 ENCOUNTER — Other Ambulatory Visit: Payer: Self-pay

## 2020-09-13 ENCOUNTER — Encounter (HOSPITAL_COMMUNITY): Payer: Self-pay | Admitting: *Deleted

## 2020-09-13 ENCOUNTER — Ambulatory Visit (INDEPENDENT_AMBULATORY_CARE_PROVIDER_SITE_OTHER): Payer: HRSA Program

## 2020-09-13 DIAGNOSIS — R059 Cough, unspecified: Secondary | ICD-10-CM | POA: Insufficient documentation

## 2020-09-13 DIAGNOSIS — L02511 Cutaneous abscess of right hand: Secondary | ICD-10-CM | POA: Diagnosis not present

## 2020-09-13 DIAGNOSIS — L03114 Cellulitis of left upper limb: Secondary | ICD-10-CM | POA: Insufficient documentation

## 2020-09-13 DIAGNOSIS — L03113 Cellulitis of right upper limb: Secondary | ICD-10-CM | POA: Insufficient documentation

## 2020-09-13 DIAGNOSIS — E1165 Type 2 diabetes mellitus with hyperglycemia: Secondary | ICD-10-CM | POA: Diagnosis not present

## 2020-09-13 DIAGNOSIS — J069 Acute upper respiratory infection, unspecified: Secondary | ICD-10-CM | POA: Diagnosis not present

## 2020-09-13 DIAGNOSIS — F17210 Nicotine dependence, cigarettes, uncomplicated: Secondary | ICD-10-CM | POA: Diagnosis not present

## 2020-09-13 DIAGNOSIS — Z7984 Long term (current) use of oral hypoglycemic drugs: Secondary | ICD-10-CM | POA: Diagnosis not present

## 2020-09-13 DIAGNOSIS — Z7982 Long term (current) use of aspirin: Secondary | ICD-10-CM | POA: Insufficient documentation

## 2020-09-13 DIAGNOSIS — L03011 Cellulitis of right finger: Secondary | ICD-10-CM | POA: Diagnosis present

## 2020-09-13 DIAGNOSIS — Z20822 Contact with and (suspected) exposure to covid-19: Secondary | ICD-10-CM | POA: Diagnosis not present

## 2020-09-13 DIAGNOSIS — L03111 Cellulitis of right axilla: Secondary | ICD-10-CM

## 2020-09-13 DIAGNOSIS — Z7901 Long term (current) use of anticoagulants: Secondary | ICD-10-CM | POA: Diagnosis not present

## 2020-09-13 DIAGNOSIS — M79641 Pain in right hand: Secondary | ICD-10-CM

## 2020-09-13 LAB — SARS CORONAVIRUS 2 (TAT 6-24 HRS): SARS Coronavirus 2: NEGATIVE

## 2020-09-13 LAB — CBG MONITORING, ED: Glucose-Capillary: 318 mg/dL — ABNORMAL HIGH (ref 70–99)

## 2020-09-13 MED ORDER — ACETAMINOPHEN 500 MG PO TABS: 1000.0000 mg | ORAL_TABLET | Freq: Once | ORAL | Status: DC

## 2020-09-13 MED ORDER — CLINDAMYCIN HCL 150 MG PO CAPS: 300.0000 mg | ORAL_CAPSULE | Freq: Three times a day (TID) | ORAL | 0 refills | Status: AC

## 2020-09-13 MED ORDER — ACETAMINOPHEN 325 MG PO TABS
ORAL_TABLET | ORAL | Status: AC
  Filled 2020-09-13: qty 3

## 2020-09-13 MED ORDER — ACETAMINOPHEN 325 MG PO TABS
975.0000 mg | ORAL_TABLET | Freq: Once | ORAL | Status: AC
  Administered 2020-09-13: 975 mg via ORAL

## 2020-09-13 NOTE — ED Provider Notes (Signed)
[facility]    CSN: [id number] Arrival date & time: 09/13/20  0935      History   Chief Complaint Chief Complaint  Patient presents with  . Skin Ulcer    HPI [name] [name] is a 60 y.o. male.   HPI  [name] [name] is a 60 y.o. male presenting to UC with c/o multiple skin sores on hands and forearms, worse one is on Right little finger for at least 1 week. He has tried using previously prescribed triamcinolone cream and silvadene without relief.  Pain is aching and sore, 6/10. Hx of DM. He has not checked his sugar "in a while." Pt also reports mildly productive cough that started today. Temp of 100.2*F at home. No known sick contacts. He has had COVID vaccines including booster in November 2021.   Past Medical History:  Diagnosis Date  . Barrett's esophagus dx 2016  . Bronchitis   . Depression   . Diabetes ([place])   . ED (erectile dysfunction)   . GERD (gastroesophageal reflux disease)   . Hepatitis C   . Hiatal hernia 10/2014   3cm  . Neuropathy     Patient Active Problem List   Diagnosis Date Noted  . Community acquired pneumonia of right lower lobe of lung   . Elevated LFTs   . Hypophosphatemia   . Acute metabolic encephalopathy   . Acute maxillary sinusitis   . Depression   . Physical deconditioning   . Pneumonia 05/14/2020  . DTs (delirium tremens) ([place]) 05/13/2020  . Hyponatremia 05/13/2020  . Hypokalemia 05/13/2020  . PNA (pneumonia) 05/13/2020  . SIRS (systemic inflammatory response syndrome) ([place]) 05/13/2020  . Sepsis with acute hypoxic respiratory failure without septic shock ([place])   . Alcohol abuse with alcohol-induced mood disorder ([place]) 12/17/2017  . Hereditary hemochromatosis ([place]) 11/23/2017  . 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 Essential hypertension 04/02/2017  . Hypogonadism male 08/13/2016  . ETOH abuse 06/22/2016  . Low serum testosterone level 01/08/2016  . Type 2 diabetes mellitus with hyperlipidemia  ([place]) 01/02/2016  . Erectile dysfunction 09/12/2015  . Hepatic cirrhosis ([place]) 01/10/2015  . Chronic hepatitis C without hepatic coma ([place]) 10/31/2014  . Tobacco use disorder 08/03/2013    Past Surgical History:  Procedure Laterality Date  . APPENDECTOMY    . COLONOSCOPY N/A 02/16/2014   Procedure: COLONOSCOPY;  Surgeon: Gatha Mayer, MD;  Location: [facility];  Service: Endoscopy;  Laterality: N/A;  . COLONOSCOPY    . TONSILLECTOMY         Home Medications    Prior to Admission medications   Medication Sig Start Date End Date Taking? Authorizing Provider  clindamycin (CLEOCIN) 150 MG capsule Take 2 capsules (300 mg total) by mouth 3 (three) times daily for 7 days. 09/13/20 09/20/20 Yes Birdia Jaycox O, PA-C  albuterol (VENTOLIN HFA) 108 (90 Base) MCG/ACT inhaler Inhale 2 puffs into the lungs every 6 (six) hours as needed for wheezing or shortness of breath. 06/16/19   [name], NP  amLODipine (NORVASC) 10 MG tablet Take 1 tablet (10 mg total) by mouth daily. Please fill as a 90 day supply 06/14/20 09/12/20  [name], NP  aspirin EC 81 MG tablet Take 1 tablet (81 mg total) by mouth daily. 04/02/17   Funches, Adriana Mccallum, MD  atorvastatin (LIPITOR) 40 MG tablet Take 1 tablet (40 mg total) by mouth daily. Please fill as a 90 day supply. 06/14/20   [name],  NP  canagliflozin (INVOKANA) 300 MG TABS tablet Take 1 tablet (300 mg total) by mouth daily before breakfast. Please fill as a 90 day supply 06/14/20   [name], NP  DULoxetine (CYMBALTA) 60 MG capsule Take 1 capsule (60 mg total) by mouth daily. Please fill as a 90 day supply 06/14/20 09/12/20  [name], NP  EPINEPHrine 0.3 mg/0.3 mL IJ SOAJ injection Inject 0.3 mg into the muscle as needed for anaphylaxis. 06/10/20   [name], NP  folic acid (FOLVITE) 1 MG tablet Take 1 tablet (1 mg total) by mouth daily. Patient not taking: Reported on 06/14/2020 05/21/20   Eugenie Filler, MD  glipiZIDE  (GLUCOTROL) 5 MG tablet Take 1 tablet (5 mg total) by mouth daily before breakfast. Please fill as a 90 day supply 06/14/20 09/12/20  [name], NP  loperamide (IMODIUM A-D) 2 MG tablet Take 1 tablet (2 mg total) by mouth 4 (four) times daily as needed for diarrhea or loose stools. 06/14/20   [name], NP  loratadine (CLARITIN) 10 MG tablet Take 1 tablet (10 mg total) by mouth daily. Please fill as a 90 day supply 06/14/20 09/12/20  [name], NP  losartan (COZAAR) 100 MG tablet Take 1 tablet (100 mg total) by mouth daily. Please fill as a 90 day supply 06/14/20   [name], NP  mirtazapine (REMERON) 15 MG tablet Take 1 tablet (15 mg total) by mouth at bedtime. Please fill as a 90 day supply 06/14/20 09/12/20  [name], NP  Multiple Vitamins-Minerals (MULTIVITAMIN ADULT) TABS Take 1 tablet by mouth daily. 04/02/17   Funches, Adriana Mccallum, MD  mupirocin ointment (BACTROBAN) 2 % Apply to affected areas twice per day. 06/14/20   [name], NP  omeprazole (PRILOSEC) 40 MG capsule Take 1 capsule (40 mg total) by mouth daily. Please fill as a 90 day supply 06/14/20 09/12/20  [name], NP  ondansetron (ZOFRAN) 4 MG tablet Take 1 tablet (4 mg total) by mouth every 8 (eight) hours as needed for nausea or vomiting. 04/08/20   Mesner, Corene Cornea, MD    Family History Family History  Problem Relation Age of Onset  . Cancer Mother        breast  . Diabetes Mother   . Heart disease Maternal Grandfather   . Pancreatic cancer Paternal Grandmother   . Stroke Father   . Alcohol abuse Father   . Pancreatic cancer Father   . Colon cancer Neg Hx   . Stomach cancer Neg Hx   . Rectal cancer Neg Hx   . Esophageal cancer Neg Hx     Social History Social History   Tobacco Use  . Smoking status: Current Every Day Smoker    Packs/day: 1.00    Years: 35.00    Pack years: 35.00    Types: Cigarettes  . Smokeless tobacco: Never Used  . Tobacco comment: none x 3 weeks as of  05/22/2020  Vaping Use  . Vaping Use: Never used  Substance Use Topics  . Alcohol use: Yes    Comment: daily  . Drug use: No     Allergies   Bee venom and Lactose intolerance (gi)   Review of Systems Review of Systems  Constitutional: Positive for fever. Negative for chills.  HENT: Positive for congestion. Negative for ear pain, sore throat, trouble swallowing and voice change.   Respiratory: Positive for cough. Negative for shortness of breath.   Cardiovascular:  Negative for chest pain and palpitations.  Gastrointestinal: Negative for abdominal pain, diarrhea, nausea and vomiting.  Musculoskeletal: Positive for arthralgias and joint swelling. Negative for back pain and myalgias.  Skin: Positive for color change and wound. Negative for rash.  All other systems reviewed and are negative.    Physical Exam Triage Vital Signs ED Triage Vitals  Enc Vitals Group     BP 09/13/20 1142 115/72     Pulse Rate 09/13/20 1142 (!) 102     Resp 09/13/20 1142 18     Temp 09/13/20 1142 100.3 F (37.9 C)     Temp Source 09/13/20 1142 Oral     SpO2 09/13/20 1142 95 %     Weight --      Height --      Head Circumference --      Peak Flow --      Pain Score 09/13/20 1139 6     Pain Loc --      Pain Edu? --      Excl. in [place]? --    No data found.  Updated Vital Signs BP 115/72 (BP Location: Left Arm)   Pulse (!) 102   Temp 100.3 F (37.9 C) (Oral)   Resp 18   SpO2 95%   Visual Acuity Right Eye Distance:   Left Eye Distance:   Bilateral Distance:    Right Eye Near:   Left Eye Near:    Bilateral Near:     Physical Exam Vitals and nursing note reviewed.  Constitutional:      General: He is not in acute distress.    Appearance: Normal appearance. He is well-developed and well-nourished. He is not ill-appearing, toxic-appearing or diaphoretic.  HENT:     Head: Normocephalic and atraumatic.     Right Ear: Tympanic membrane and ear canal normal.     Left Ear: Tympanic  membrane and ear canal normal.     Nose: Nose normal.     Mouth/Throat:     Mouth: Mucous membranes are moist.     Pharynx: Oropharynx is clear.  Eyes:     Extraocular Movements: EOM normal.  Cardiovascular:     Rate and Rhythm: Normal rate and regular rhythm.  Pulmonary:     Effort: Pulmonary effort is normal. No respiratory distress.     Breath sounds: Normal breath sounds. No stridor. No wheezing, rhonchi or rales.  Musculoskeletal:        General: Swelling and tenderness present. Normal range of motion.       Hands:     Cervical back: Normal range of motion.  Skin:    General: Skin is warm and dry.     Capillary Refill: Capillary refill takes less than 2 seconds.     Findings: Erythema present.  Neurological:     Mental Status: He is alert and oriented to person, place, and time.     Sensory: No sensory deficit.  Psychiatric:        Mood and Affect: Mood and affect normal.        Behavior: Behavior normal.      UC Treatments / Results  Labs (all labs ordered are listed, but only abnormal results are displayed) Labs Reviewed  CBG MONITORING, ED - Abnormal; Notable for the following components:      Result Value   Glucose-Capillary 318 (*)    All other components within normal limits  SARS CORONAVIRUS 2 (TAT 6-24 HRS)    EKG   Radiology  DG Hand Complete Right  Result Date: 09/13/2020 CLINICAL DATA:  Pain and cellulitis EXAM: RIGHT HAND - COMPLETE 3+ VIEW COMPARISON:  None. FINDINGS: Frontal, oblique, and lateral views were obtained. No appreciable fracture or dislocation. Focal calcification dorsal to the distal aspect of the fifth middle phalanx appears well corticated and may represent residua of old trauma. Tiny calcification in the medial aspect of the second DIP joint as well as calcification in the dorsal aspect of the first IP joint may have arthropathic etiology. There is no appreciable joint space narrowing. No erosion. There are foci of arterial vascular  calcification. No bony destruction. IMPRESSION: No fracture or dislocation. No bony destruction. Areas of intra-articular calcification at the first IP and second DIP joints, likely of arthropathic etiology. Calcification dorsal to the fifth middle phalanx likely is of distant posttraumatic etiology. Electronically Signed   By: Lowella Grip III M.D.   On: 09/13/2020 12:44    Procedures Procedures (including critical care time)  Medications Ordered in UC Medications  acetaminophen (TYLENOL) tablet 975 mg (975 mg Oral Given 09/13/20 1248)    Initial Impression / Assessment and Plan / UC Course  I have reviewed the triage vital signs and the nursing notes.  Pertinent labs & imaging results that were available during my care of the patient were reviewed by me and considered in my medical decision making (see chart for details).    No evidence of osteomyelitis, however, due to severity of swelling and redness with open sore on Right little finger, will start pt on clindamycin. Encouraged close f/u. COVID PCR pending Pt has previously scheduled f/u with PCP on Monday, 09/16/20. Encouraged to keep appointment AVS given  Final Clinical Impressions(s) / UC Diagnoses   Final diagnoses:  Abscess of multiple sites of right hand and fingers  Cellulitis of right forearm  Cellulitis of left hand  Viral upper respiratory illness  Uncontrolled type 2 diabetes mellitus with hyperglycemia ([place])     Discharge Instructions      Please take antibiotics as prescribed and be sure to complete entire course even if you start to feel better to ensure infection does not come back.  Keep wounds clean with warm water and soap. Follow up in 2-3 days for a wound recheck of Right hand, specifically little finger.    Call 911 or have someone drive you to the hospital if symptoms significantly worsening.     ED Prescriptions    Medication Sig Dispense Auth. Provider   clindamycin (CLEOCIN) 150 MG  capsule Take 2 capsules (300 mg total) by mouth 3 (three) times daily for 7 days. 42 capsule Noe Gens, [place]     PDMP not reviewed this encounter.   Noe Gens, PA-C 09/13/20 1404

## 2020-09-13 NOTE — ED Triage Notes (Signed)
AT the completion of triage Pt reports he has had a cough that is productive since today.Pt also has a fever of 100.2

## 2020-09-13 NOTE — ED Triage Notes (Signed)
PT has multiple wounds on rt hand and wrist.  Wounds have  brown dried skin. The surrounding skin is swollen.

## 2020-09-13 NOTE — Discharge Instructions (Addendum)
°  Please take antibiotics as prescribed and be sure to complete entire course even if you start to feel better to ensure infection does not come back.  Keep wounds clean with warm water and soap. Follow up in 2-3 days for a wound recheck of Right hand, specifically little finger.    Call 911 or have someone drive you to the hospital if symptoms significantly worsening.

## 2020-09-16 ENCOUNTER — Other Ambulatory Visit: Payer: Self-pay | Admitting: Nurse Practitioner

## 2020-09-16 ENCOUNTER — Encounter: Payer: Self-pay | Admitting: Nurse Practitioner

## 2020-09-16 ENCOUNTER — Other Ambulatory Visit: Payer: Self-pay

## 2020-09-16 ENCOUNTER — Ambulatory Visit: Payer: Self-pay | Attending: Nurse Practitioner | Admitting: Nurse Practitioner

## 2020-09-16 DIAGNOSIS — I10 Essential (primary) hypertension: Secondary | ICD-10-CM

## 2020-09-16 DIAGNOSIS — IMO0002 Reserved for concepts with insufficient information to code with codable children: Secondary | ICD-10-CM

## 2020-09-16 DIAGNOSIS — E785 Hyperlipidemia, unspecified: Secondary | ICD-10-CM

## 2020-09-16 DIAGNOSIS — F10982 Alcohol use, unspecified with alcohol-induced sleep disorder: Secondary | ICD-10-CM

## 2020-09-16 DIAGNOSIS — R350 Frequency of micturition: Secondary | ICD-10-CM

## 2020-09-16 DIAGNOSIS — Z1211 Encounter for screening for malignant neoplasm of colon: Secondary | ICD-10-CM

## 2020-09-16 DIAGNOSIS — K2100 Gastro-esophageal reflux disease with esophagitis, without bleeding: Secondary | ICD-10-CM

## 2020-09-16 DIAGNOSIS — J302 Other seasonal allergic rhinitis: Secondary | ICD-10-CM

## 2020-09-16 DIAGNOSIS — E1165 Type 2 diabetes mellitus with hyperglycemia: Secondary | ICD-10-CM

## 2020-09-16 DIAGNOSIS — E1142 Type 2 diabetes mellitus with diabetic polyneuropathy: Secondary | ICD-10-CM

## 2020-09-16 DIAGNOSIS — K7460 Unspecified cirrhosis of liver: Secondary | ICD-10-CM

## 2020-09-16 DIAGNOSIS — E1140 Type 2 diabetes mellitus with diabetic neuropathy, unspecified: Secondary | ICD-10-CM

## 2020-09-16 MED ORDER — OMEPRAZOLE 40 MG PO CPDR: 40.0000 mg | DELAYED_RELEASE_CAPSULE | Freq: Every day | ORAL | 1 refills | Status: DC

## 2020-09-16 MED ORDER — AMLODIPINE BESYLATE 10 MG PO TABS
10.0000 mg | ORAL_TABLET | Freq: Every day | ORAL | 1 refills | Status: DC
Start: 2020-09-16 — End: 2021-02-26

## 2020-09-16 MED ORDER — MIRTAZAPINE 15 MG PO TABS: 15.0000 mg | ORAL_TABLET | Freq: Every day | ORAL | 1 refills | Status: DC

## 2020-09-16 MED ORDER — LORATADINE 10 MG PO TABS: 10.0000 mg | ORAL_TABLET | Freq: Every day | ORAL | 1 refills | Status: DC

## 2020-09-16 MED ORDER — CANAGLIFLOZIN 300 MG PO TABS: 300.0000 mg | ORAL_TABLET | Freq: Every day | ORAL | 1 refills | Status: DC

## 2020-09-16 MED ORDER — GLIPIZIDE 5 MG PO TABS: 5.0000 mg | ORAL_TABLET | Freq: Every day | ORAL | 1 refills | Status: DC

## 2020-09-16 MED ORDER — LOSARTAN POTASSIUM 100 MG PO TABS: 100.0000 mg | ORAL_TABLET | Freq: Every day | ORAL | 1 refills | Status: DC

## 2020-09-16 MED ORDER — ATORVASTATIN CALCIUM 40 MG PO TABS: 40.0000 mg | ORAL_TABLET | Freq: Every day | ORAL | 2 refills | Status: DC

## 2020-09-16 MED ORDER — DULOXETINE HCL 60 MG PO CPEP: 60.0000 mg | ORAL_CAPSULE | Freq: Every day | ORAL | 1 refills | Status: DC

## 2020-09-16 MED FILL — ?DULOXetine HCL 60MG CAPS: 60 | 30 days supply | Qty: 30 | Fill #0

## 2020-09-16 MED FILL — ?GLIPIZIDE 5MG TABLET: 5 | 30 days supply | Qty: 30 | Fill #0

## 2020-09-16 MED FILL — LOSARTAN POTASSIUM 100 MG T: 100 | 30 days supply | Qty: 30 | Fill #0

## 2020-09-16 MED FILL — ?ATORVASTATIN 40MG TABLET: 40 | 30 days supply | Qty: 30 | Fill #0

## 2020-09-16 MED FILL — OMEPRAZOLE DR 40 MG CAPSULE: 40 | 30 days supply | Qty: 30 | Fill #0

## 2020-09-16 MED FILL — ?MIRTAZAPINE 15 MG TABLET: 15 | 30 days supply | Qty: 30 | Fill #0

## 2020-09-16 MED FILL — INVOKANA 300 MG TABLET: 300 | 30 days supply | Qty: 30 | Fill #0

## 2020-09-16 MED FILL — AMLODIPINE BESYLATE 10 MG T: 10 | 30 days supply | Qty: 30 | Fill #0

## 2020-09-16 NOTE — Progress Notes (Signed)
Virtual Visit via Telephone Note Due to national recommendations of social distancing due to [place] 19, telehealth visit is felt to be most appropriate for this patient at this time.  I discussed the limitations, risks, security and privacy concerns of performing an evaluation and management service by telephone and the availability of in person appointments. I also discussed with the patient that there may be a patient responsible charge related to this service. The patient expressed understanding and agreed to proceed.    I connected with [name] [name] on 09/16/20  at   9:10 AM EST  EDT by telephone and verified that I am speaking with the correct person using two identifiers.   Consent I discussed the limitations, risks, security and privacy concerns of performing an evaluation and management service by telephone and the availability of in person appointments. I also discussed with the patient that there may be a patient responsible charge related to this service. The patient expressed understanding and agreed to proceed.   Location of Patient: Private Residence    Location of Provider: [place] and [employer] Office    Persons participating in Telemedicine visit: [name] FNP-BC [place]    History of Present Illness: Telemedicine visit for: Follow Up  DM2 He has not been monitoring his blood glucose levels daily. Hyperglycemic symptoms include neuropathy for which he takes Cymbalta and most currently urinary frequency which has been worsening over the past 2 months along with polydipsia.  He does endorse dietary noncompliance including drinking excessive amoutns of sodas. Denies chest pain, shortness of breath, palpitations, lightheadedness, dizziness, headaches or BLE edema.  Endorses adherence taking Invokana 300 mg daily and glipizide 5 mg daily.  LDL at goal with atorvastatin 40 mg daily. Lab Results  Component Value Date   HGBA1C 6.1 (H)  05/13/2020   Lab Results  Component Value Date   LDLCALC 62 06/20/2020    ESSENTIAL HYPERTENSION He does not monitor his blood pressure at home as he does not have a device however he does endorse medication adherence taking amlodipine 10 mg daily and losartan 100 mg daily. BP Readings from Last 3 Encounters:  09/13/20 115/72  05/22/20 133/79  05/21/20 (!) 143/81    He was recently treated in the emergency room on January 14 for multiple skin sores/abscesses on both hands, cellulitis of the right forearm and left hand along with viral upper respiratory illness.  Sent home with Cleocin which she is currently still taking.  Blood glucose level at that time was 318.  Past Medical History:  Diagnosis Date  . Barrett's esophagus dx 2016  . Bronchitis   . Depression   . Diabetes ([place])   . ED (erectile dysfunction)   . GERD (gastroesophageal reflux disease)   . Hepatitis C   . Hiatal hernia 10/2014   3cm  . Neuropathy     Past Surgical History:  Procedure Laterality Date  . APPENDECTOMY    . COLONOSCOPY N/A 02/16/2014   Procedure: COLONOSCOPY;  Surgeon: Gatha Mayer, MD;  Location: [facility];  Service: Endoscopy;  Laterality: N/A;  . COLONOSCOPY    . TONSILLECTOMY      Family History  Problem Relation Age of Onset  . Cancer Mother        breast  . Diabetes Mother   . Heart disease Maternal Grandfather   . Pancreatic cancer Paternal Grandmother   . Stroke Father   . Alcohol abuse Father   . Pancreatic cancer  Father   . Colon cancer Neg Hx   . Stomach cancer Neg Hx   . Rectal cancer Neg Hx   . Esophageal cancer Neg Hx     Social History   Socioeconomic History  . Marital status: Single    Spouse name: Not on file  . Number of children: Not on file  . Years of education: Not on file  . Highest education level: Not on file  Occupational History  . Not on file  Tobacco Use  . Smoking status: Current Every Day Smoker    Packs/day: 0.50    Years: 35.00    Pack  years: 17.50    Types: Cigarettes  . Smokeless tobacco: Never Used  . Tobacco comment: none x 3 weeks as of 05/22/2020  Vaping Use  . Vaping Use: Never used  Substance and Sexual Activity  . Alcohol use: Yes    Comment: daily  . Drug use: No  . Sexual activity: Not on file  Other Topics Concern  . Not on file  Social History Narrative  . Not on file   Social Determinants of Health   Financial Resource Strain: Not on file  Food Insecurity: Not on file  Transportation Needs: Not on file  Physical Activity: Not on file  Stress: Not on file  Social Connections: Not on file     Observations/Objective: Awake, alert and oriented x 3   Review of Systems  Constitutional: Negative for fever, malaise/fatigue and weight loss.  HENT: Negative.  Negative for nosebleeds.   Eyes: Negative.  Negative for blurred vision, double vision and photophobia.  Respiratory: Negative.  Negative for cough and shortness of breath.   Cardiovascular: Negative.  Negative for chest pain, palpitations and leg swelling.  Gastrointestinal: Negative.  Negative for heartburn, nausea and vomiting.  Musculoskeletal: Negative.  Negative for myalgias.  Neurological: Negative.  Negative for dizziness, focal weakness, seizures and headaches.  Psychiatric/Behavioral: Negative.  Negative for suicidal ideas.    Assessment and Plan: Thai was seen today for follow-up.  Diagnoses and all orders for this visit:  Essential hypertension -     amLODipine (NORVASC) 10 MG tablet; Take 1 tablet (10 mg total) by mouth daily. Please fill as a 90 day supply -     losartan (COZAAR) 100 MG tablet; Take 1 tablet (100 mg total) by mouth daily. Please fill as a 90 day supply -     CMP14+EGFR; Future Continue all antihypertensives as prescribed.  Remember to bring in your blood pressure log with you for your follow up appointment.  DASH/Mediterranean Diets are healthier choices for HTN.    Dyslipidemia, goal LDL below 70 -      atorvastatin (LIPITOR) 40 MG tablet; Take 1 tablet (40 mg total) by mouth daily. Please fill as a 90 day supply. INSTRUCTIONS: Work on a low fat, heart healthy diet and participate in regular aerobic exercise program by working out at least 150 minutes per week; 5 days a week-30 minutes per day. Avoid red meat/beef/steak,  fried foods. junk foods, sodas, sugary drinks, unhealthy snacking, alcohol and smoking.  Drink at least 80 oz of water per day and monitor your carbohydrate intake daily.    Uncontrolled diabetes mellitus with diabetic neuropathy (HCC) -     canagliflozin (INVOKANA) 300 MG TABS tablet; Take 1 tablet (300 mg total) by mouth daily before breakfast. Please fill as a 90 day supply -     glipiZIDE (GLUCOTROL) 5 MG tablet; Take 1 tablet (5  mg total) by mouth daily before breakfast. Please fill as a 90 day supply -     Hemoglobin A1c; Future Continue blood sugar control as discussed in office today, low carbohydrate diet, and regular physical exercise as tolerated, 150 minutes per week (30 min each day, 5 days per week, or 50 min 3 days per week). Keep blood sugar logs with fasting goal of 90-130 mg/dl, post prandial (after you eat) less than 180.  For Hypoglycemia: BS <60 and Hyperglycemia BS >400; contact the clinic ASAP. Annual eye exams and foot exams are recommended.  Seasonal allergies -     loratadine (CLARITIN) 10 MG tablet; Take 1 tablet (10 mg total) by mouth daily. Please fill as a 90 day supply  Gastroesophageal reflux disease with esophagitis without hemorrhage -     omeprazole (PRILOSEC) 40 MG capsule; Take 1 capsule (40 mg total) by mouth daily. Please fill as a 90 day supply INSTRUCTIONS: Avoid GERD Triggers: acidic, spicy or fried foods, caffeine, coffee, sodas,  alcohol and chocolate.   Alcohol-induced insomnia (HCC) -     mirtazapine (REMERON) 15 MG tablet; Take 1 tablet (15 mg total) by mouth at bedtime. Please fill as a 90 day supply  Diabetic polyneuropathy  associated with type 2 diabetes mellitus (HCC) -     DULoxetine (CYMBALTA) 60 MG capsule; Take 1 capsule (60 mg total) by mouth daily. Please fill as a 90 day supply  Increased urinary frequency -     PSA; Future  Colon cancer screening -     Fecal occult blood, imunochemical([facility]); Future  Cirrhosis of liver without ascites, unspecified hepatic cirrhosis type ([place]) -     CMP14+EGFR; Future -     CBC; Future     Follow Up Instructions Return in about 3 months (around 12/15/2020).     I discussed the assessment and treatment plan with the patient. The patient was provided an opportunity to ask questions and all were answered. The patient agreed with the plan and demonstrated an understanding of the instructions.   The patient was advised to call back or seek an in-person evaluation if the symptoms worsen or if the condition fails to improve as anticipated.  I provided 19 minutes of non-face-to-face time during this encounter including median intraservice time, reviewing previous notes, labs, imaging, medications and explaining diagnosis and management.  [name], FNP-BC

## 2020-09-20 ENCOUNTER — Other Ambulatory Visit: Payer: Self-pay

## 2020-09-23 ENCOUNTER — Other Ambulatory Visit: Payer: Self-pay

## 2020-09-23 ENCOUNTER — Ambulatory Visit: Payer: Self-pay | Attending: Family Medicine

## 2020-09-23 DIAGNOSIS — R350 Frequency of micturition: Secondary | ICD-10-CM

## 2020-09-23 DIAGNOSIS — Z1211 Encounter for screening for malignant neoplasm of colon: Secondary | ICD-10-CM

## 2020-09-23 DIAGNOSIS — E1140 Type 2 diabetes mellitus with diabetic neuropathy, unspecified: Secondary | ICD-10-CM

## 2020-09-23 DIAGNOSIS — K7460 Unspecified cirrhosis of liver: Secondary | ICD-10-CM

## 2020-09-23 DIAGNOSIS — I10 Essential (primary) hypertension: Secondary | ICD-10-CM

## 2020-09-23 DIAGNOSIS — IMO0002 Reserved for concepts with insufficient information to code with codable children: Secondary | ICD-10-CM

## 2020-09-23 MED FILL — OMEPRAZOLE DR 40 MG CAPSULE: 40 | 30 days supply | Qty: 30 | Fill #0

## 2020-09-23 MED FILL — INVOKANA 300 MG TABLET: 300 | 30 days supply | Qty: 30 | Fill #0

## 2020-09-24 ENCOUNTER — Other Ambulatory Visit: Payer: Self-pay | Admitting: Nurse Practitioner

## 2020-09-24 LAB — CMP14+EGFR
ALT: 32 IU/L (ref 0–44)
AST: 29 IU/L (ref 0–40)
Albumin/Globulin Ratio: 1.4 (ref 1.2–2.2)
Albumin: 4.3 g/dL (ref 3.8–4.9)
Alkaline Phosphatase: 116 IU/L (ref 44–121)
BUN/Creatinine Ratio: 12 (ref 9–20)
BUN: 9 mg/dL (ref 6–24)
Bilirubin Total: 0.3 mg/dL (ref 0.0–1.2)
CO2: 22 mmol/L (ref 20–29)
Calcium: 9 mg/dL (ref 8.7–10.2)
Chloride: 94 mmol/L — ABNORMAL LOW (ref 96–106)
Creatinine, Ser: 0.76 mg/dL (ref 0.76–1.27)
GFR calc Af Amer: 116 mL/min/{1.73_m2} (ref >59)
GFR calc non Af Amer: 101 mL/min/{1.73_m2} (ref >59)
Globulin, Total: 3 g/dL (ref 1.5–4.5)
Glucose: 702 mg/dL (ref 65–99)
Potassium: 4.7 mmol/L (ref 3.5–5.2)
Sodium: 129 mmol/L — ABNORMAL LOW (ref 134–144)
Total Protein: 7.3 g/dL (ref 6.0–8.5)

## 2020-09-24 LAB — CBC
Hematocrit: 43.6 % (ref 37.5–51.0)
Hemoglobin: 14.1 g/dL (ref 13.0–17.7)
MCH: 29.3 pg (ref 26.6–33.0)
MCHC: 32.3 g/dL (ref 31.5–35.7)
MCV: 91 fL (ref 79–97)
Platelets: 251 10*3/uL (ref 150–450)
RBC: 4.81 x10E6/uL (ref 4.14–5.80)
RDW: 11.8 % (ref 11.6–15.4)
WBC: 11.3 10*3/uL — ABNORMAL HIGH (ref 3.4–10.8)

## 2020-09-24 LAB — HEMOGLOBIN A1C
Est. average glucose Bld gHb Est-mCnc: 301 mg/dL
Hgb A1c MFr Bld: 12.1 % — ABNORMAL HIGH (ref 4.8–5.6)

## 2020-09-24 LAB — PSA: Prostate Specific Ag, Serum: 0.2 ng/mL (ref 0.0–4.0)

## 2020-09-24 NOTE — Progress Notes (Signed)
Rcvd call from [facility] service regarding [name] [name]'s blood glucose >700. I did call [name] [name] and urged him to go to the emergency room for treatment. He is currently asymptomatic. States he had a few soft drinks before he came into the lab.

## 2020-09-28 LAB — FECAL OCCULT BLOOD, IMMUNOCHEMICAL: Fecal Occult Bld: NEGATIVE

## 2020-10-04 ENCOUNTER — Encounter: Payer: Self-pay | Admitting: Nurse Practitioner

## 2020-10-24 ENCOUNTER — Telehealth: Payer: Self-pay

## 2020-10-24 ENCOUNTER — Other Ambulatory Visit: Payer: Self-pay | Admitting: Nurse Practitioner

## 2020-10-24 DIAGNOSIS — M2550 Pain in unspecified joint: Secondary | ICD-10-CM

## 2020-10-24 MED FILL — ?MIRTAZAPINE 15 MG TABLET: 15 | 30 days supply | Qty: 30 | Fill #1

## 2020-10-24 MED FILL — ?GLIPIZIDE 5MG TABLET: 5 | 30 days supply | Qty: 30 | Fill #1

## 2020-10-24 MED FILL — OMEPRAZOLE DR 40 MG CAPSULE: 40 | 30 days supply | Qty: 30 | Fill #1

## 2020-10-24 MED FILL — INVOKANA 300 MG TABLET: 300 | 30 days supply | Qty: 30 | Fill #1

## 2020-10-24 MED FILL — AMLODIPINE BESYLATE 10 MG T: 10 | 30 days supply | Qty: 30 | Fill #1

## 2020-10-24 MED FILL — ?ATORVASTATIN 40MG TABLET: 40 | 30 days supply | Qty: 30 | Fill #1

## 2020-10-24 MED FILL — LOSARTAN POTASSIUM 100 MG T: 100 | 30 days supply | Qty: 30 | Fill #1

## 2020-10-24 MED FILL — DULoxetine HCL 60 MG CPEP: 60 | 30 days supply | Qty: 30 | Fill #1

## 2020-10-24 NOTE — Telephone Encounter (Signed)
**  [employer]**1ST REQUEST FOR 2020 TAX RETURN FOR MEDICATION ASSISTANCE APPLICATION.  PT EXPRESSED UNDERSTANDING THAT COPY OF TAX RETURN NEEDED TO COMPLETE APPLICATION PROCESS, STATED HE WOULD BRING IN.  STATED THAT WE SHOULD ALREADY HAVE IT, BUT THE ONLY RECORD OF TAX RETURN IS FROM 2019.  PT AWARE OF APPLICATION PROCESS, HAVE BEEN PROCESSING PASS APPLICATIONS FOR PATIENT SINCE 2019.

## 2020-10-28 ENCOUNTER — Encounter (HOSPITAL_COMMUNITY): Payer: Self-pay | Admitting: Emergency Medicine

## 2020-10-28 ENCOUNTER — Encounter (HOSPITAL_COMMUNITY): Payer: Self-pay

## 2020-10-28 ENCOUNTER — Inpatient Hospital Stay (HOSPITAL_COMMUNITY): Payer: Medicaid Other

## 2020-10-28 ENCOUNTER — Other Ambulatory Visit: Payer: Self-pay

## 2020-10-28 ENCOUNTER — Ambulatory Visit (HOSPITAL_COMMUNITY)
Admission: EM | Admit: 2020-10-28 | Discharge: 2020-10-28 | Disposition: A | Discharge status: Home / Self Care | Payer: Self-pay

## 2020-10-28 ENCOUNTER — Emergency Department (HOSPITAL_COMMUNITY): Payer: Medicaid Other

## 2020-10-28 ENCOUNTER — Inpatient Hospital Stay (HOSPITAL_COMMUNITY)
Admission: EM | Admit: 2020-10-28 | Discharge: 2020-11-01 | DRG: 872 | Disposition: A | Discharge status: Home / Self Care | Payer: Medicaid Other | Source: Ambulatory Visit | Attending: Internal Medicine | Admitting: Internal Medicine

## 2020-10-28 DIAGNOSIS — E876 Hypokalemia: Secondary | ICD-10-CM | POA: Diagnosis present

## 2020-10-28 DIAGNOSIS — E1169 Type 2 diabetes mellitus with other specified complication: Secondary | ICD-10-CM

## 2020-10-28 DIAGNOSIS — N179 Acute kidney failure, unspecified: Secondary | ICD-10-CM | POA: Diagnosis present

## 2020-10-28 DIAGNOSIS — L03116 Cellulitis of left lower limb: Secondary | ICD-10-CM

## 2020-10-28 DIAGNOSIS — L97509 Non-pressure chronic ulcer of other part of unspecified foot with unspecified severity: Secondary | ICD-10-CM

## 2020-10-28 DIAGNOSIS — R6520 Severe sepsis without septic shock: Secondary | ICD-10-CM | POA: Diagnosis present

## 2020-10-28 DIAGNOSIS — K2270 Barrett's esophagus without dysplasia: Secondary | ICD-10-CM | POA: Diagnosis present

## 2020-10-28 DIAGNOSIS — R7881 Bacteremia: Secondary | ICD-10-CM | POA: Diagnosis not present

## 2020-10-28 DIAGNOSIS — F17200 Nicotine dependence, unspecified, uncomplicated: Secondary | ICD-10-CM

## 2020-10-28 DIAGNOSIS — E785 Hyperlipidemia, unspecified: Secondary | ICD-10-CM | POA: Diagnosis present

## 2020-10-28 DIAGNOSIS — Z7982 Long term (current) use of aspirin: Secondary | ICD-10-CM

## 2020-10-28 DIAGNOSIS — E11621 Type 2 diabetes mellitus with foot ulcer: Secondary | ICD-10-CM | POA: Diagnosis present

## 2020-10-28 DIAGNOSIS — I10 Essential (primary) hypertension: Secondary | ICD-10-CM | POA: Diagnosis present

## 2020-10-28 DIAGNOSIS — Z833 Family history of diabetes mellitus: Secondary | ICD-10-CM

## 2020-10-28 DIAGNOSIS — Z20822 Contact with and (suspected) exposure to covid-19: Secondary | ICD-10-CM | POA: Diagnosis present

## 2020-10-28 DIAGNOSIS — F32A Depression, unspecified: Secondary | ICD-10-CM | POA: Diagnosis present

## 2020-10-28 DIAGNOSIS — A4159 Other Gram-negative sepsis: Principal | ICD-10-CM | POA: Diagnosis present

## 2020-10-28 DIAGNOSIS — Z7984 Long term (current) use of oral hypoglycemic drugs: Secondary | ICD-10-CM

## 2020-10-28 DIAGNOSIS — A419 Sepsis, unspecified organism: Secondary | ICD-10-CM

## 2020-10-28 DIAGNOSIS — E1165 Type 2 diabetes mellitus with hyperglycemia: Secondary | ICD-10-CM | POA: Diagnosis present

## 2020-10-28 DIAGNOSIS — Z79899 Other long term (current) drug therapy: Secondary | ICD-10-CM | POA: Diagnosis not present

## 2020-10-28 DIAGNOSIS — E739 Lactose intolerance, unspecified: Secondary | ICD-10-CM | POA: Diagnosis present

## 2020-10-28 DIAGNOSIS — F17210 Nicotine dependence, cigarettes, uncomplicated: Secondary | ICD-10-CM | POA: Diagnosis present

## 2020-10-28 DIAGNOSIS — E11628 Type 2 diabetes mellitus with other skin complications: Secondary | ICD-10-CM | POA: Diagnosis not present

## 2020-10-28 DIAGNOSIS — K219 Gastro-esophageal reflux disease without esophagitis: Secondary | ICD-10-CM | POA: Diagnosis present

## 2020-10-28 DIAGNOSIS — Z800 Family history of malignant neoplasm of digestive organs: Secondary | ICD-10-CM

## 2020-10-28 DIAGNOSIS — E871 Hypo-osmolality and hyponatremia: Secondary | ICD-10-CM | POA: Diagnosis present

## 2020-10-28 DIAGNOSIS — Z8249 Family history of ischemic heart disease and other diseases of the circulatory system: Secondary | ICD-10-CM | POA: Diagnosis not present

## 2020-10-28 DIAGNOSIS — Z66 Do not resuscitate: Secondary | ICD-10-CM | POA: Diagnosis present

## 2020-10-28 DIAGNOSIS — L97529 Non-pressure chronic ulcer of other part of left foot with unspecified severity: Secondary | ICD-10-CM | POA: Diagnosis present

## 2020-10-28 DIAGNOSIS — L03119 Cellulitis of unspecified part of limb: Secondary | ICD-10-CM

## 2020-10-28 DIAGNOSIS — S90422A Blister (nonthermal), left great toe, initial encounter: Secondary | ICD-10-CM | POA: Diagnosis present

## 2020-10-28 DIAGNOSIS — R0902 Hypoxemia: Secondary | ICD-10-CM | POA: Diagnosis present

## 2020-10-28 DIAGNOSIS — Z823 Family history of stroke: Secondary | ICD-10-CM

## 2020-10-28 DIAGNOSIS — R7989 Other specified abnormal findings of blood chemistry: Secondary | ICD-10-CM | POA: Diagnosis present

## 2020-10-28 HISTORY — DX: Sepsis, unspecified organism: A41.9

## 2020-10-28 LAB — URINALYSIS, ROUTINE W REFLEX MICROSCOPIC
Bilirubin Urine: NEGATIVE
Glucose, UA: >=500 mg/dL — AB
Ketones, ur: 5 mg/dL — AB
Leukocytes,Ua: NEGATIVE
Nitrite: NEGATIVE
Protein, ur: NEGATIVE mg/dL
Specific Gravity, Urine: 1.026 (ref 1.005–1.030)
pH: 5 (ref 5.0–8.0)

## 2020-10-28 LAB — CBG MONITORING, ED: Glucose-Capillary: 481 mg/dL — ABNORMAL HIGH (ref 70–99)

## 2020-10-28 LAB — LACTIC ACID, PLASMA
Lactic Acid, Venous: 1.9 mmol/L (ref 0.5–1.9)
Lactic Acid, Venous: 2.5 mmol/L (ref 0.5–1.9)
Lactic Acid, Venous: 1.7 mmol/L (ref 0.5–1.9)
Lactic Acid, Venous: 1 mmol/L (ref 0.5–1.9)

## 2020-10-28 LAB — CBC WITH DIFFERENTIAL/PLATELET
Abs Immature Granulocytes: 0.32 10*3/uL — ABNORMAL HIGH (ref 0.00–0.07)
Basophils Absolute: 0.1 10*3/uL (ref 0.0–0.1)
Basophils Relative: 0 %
Eosinophils Absolute: 0.2 10*3/uL (ref 0.0–0.5)
Eosinophils Relative: 1 %
HCT: 43.2 % (ref 39.0–52.0)
Hemoglobin: 14.4 g/dL (ref 13.0–17.0)
Immature Granulocytes: 2 %
Lymphocytes Relative: 2 %
Lymphs Abs: 0.4 10*3/uL — ABNORMAL LOW (ref 0.7–4.0)
MCH: 30.1 pg (ref 26.0–34.0)
MCHC: 33.3 g/dL (ref 30.0–36.0)
MCV: 90.4 fL (ref 80.0–100.0)
Monocytes Absolute: 0.4 10*3/uL (ref 0.1–1.0)
Monocytes Relative: 2 %
Neutro Abs: 19.8 10*3/uL — ABNORMAL HIGH (ref 1.7–7.7)
Neutrophils Relative %: 93 %
Platelets: 202 10*3/uL (ref 150–400)
RBC: 4.78 MIL/uL (ref 4.22–5.81)
RDW: 12.5 % (ref 11.5–15.5)
WBC: 21.1 10*3/uL — ABNORMAL HIGH (ref 4.0–10.5)
nRBC: 0 % (ref 0.0–0.2)

## 2020-10-28 LAB — COMPREHENSIVE METABOLIC PANEL
ALT: 20 U/L (ref 0–44)
AST: 20 U/L (ref 15–41)
Albumin: 3.6 g/dL (ref 3.5–5.0)
Alkaline Phosphatase: 91 U/L (ref 38–126)
Anion gap: 12 (ref 5–15)
BUN: 21 mg/dL — ABNORMAL HIGH (ref 6–20)
CO2: 25 mmol/L (ref 22–32)
Calcium: 8.6 mg/dL — ABNORMAL LOW (ref 8.9–10.3)
Chloride: 91 mmol/L — ABNORMAL LOW (ref 98–111)
Creatinine, Ser: 1.21 mg/dL (ref 0.61–1.24)
GFR, Estimated: >60 mL/min (ref >60)
Glucose, Bld: 449 mg/dL — ABNORMAL HIGH (ref 70–99)
Potassium: 3.1 mmol/L — ABNORMAL LOW (ref 3.5–5.1)
Sodium: 128 mmol/L — ABNORMAL LOW (ref 135–145)
Total Bilirubin: 0.8 mg/dL (ref 0.3–1.2)
Total Protein: 8.1 g/dL (ref 6.5–8.1)

## 2020-10-28 LAB — RESP PANEL BY RT-PCR (FLU A&B, COVID) ARPGX2
Influenza A by PCR: NEGATIVE
Influenza B by PCR: NEGATIVE
SARS Coronavirus 2 by RT PCR: NEGATIVE

## 2020-10-28 LAB — PROTIME-INR
INR: 1.3 — ABNORMAL HIGH (ref 0.8–1.2)
Prothrombin Time: 15.7 seconds — ABNORMAL HIGH (ref 11.4–15.2)

## 2020-10-28 LAB — HEMOGLOBIN A1C
Hgb A1c MFr Bld: 11.2 % — ABNORMAL HIGH (ref 4.8–5.6)
Mean Plasma Glucose: 274.74 mg/dL

## 2020-10-28 LAB — GLUCOSE, CAPILLARY
Glucose-Capillary: 341 mg/dL — ABNORMAL HIGH (ref 70–99)
Glucose-Capillary: 175 mg/dL — ABNORMAL HIGH (ref 70–99)
Glucose-Capillary: 96 mg/dL (ref 70–99)

## 2020-10-28 MED ORDER — SODIUM CHLORIDE 0.9 % IV SOLN: INTRAVENOUS | Status: AC

## 2020-10-28 MED ORDER — LACTATED RINGERS IV BOLUS (SEPSIS)
1000.0000 mL | Freq: Once | INTRAVENOUS | Status: AC
  Administered 2020-10-28: 1000 mL via INTRAVENOUS

## 2020-10-28 MED ORDER — INSULIN ASPART 100 UNIT/ML ~~LOC~~ SOLN
3.0000 [IU] | Freq: Three times a day (TID) | SUBCUTANEOUS | Status: DC
  Administered 2020-10-28 – 2020-11-01 (×11): 3 [IU] via SUBCUTANEOUS
  Filled 2020-10-28: qty 0.03

## 2020-10-28 MED ORDER — ATORVASTATIN CALCIUM 40 MG PO TABS
40.0000 mg | ORAL_TABLET | Freq: Every day | ORAL | Status: DC
Start: 2020-10-28 — End: 2020-11-01
  Administered 2020-10-29 – 2020-11-01 (×4): 40 mg via ORAL
  Filled 2020-10-28 (×4): qty 1

## 2020-10-28 MED ORDER — SODIUM CHLORIDE 0.9 % IV SOLN
2.0000 g | Freq: Three times a day (TID) | INTRAVENOUS | Status: DC
  Filled 2020-10-28: qty 2

## 2020-10-28 MED ORDER — SODIUM CHLORIDE 0.9 % IV BOLUS
500.0000 mL | Freq: Once | INTRAVENOUS | Status: AC
  Administered 2020-10-28: 500 mL via INTRAVENOUS

## 2020-10-28 MED ORDER — METRONIDAZOLE IN NACL 5-0.79 MG/ML-% IV SOLN
500.0000 mg | Freq: Three times a day (TID) | INTRAVENOUS | Status: DC
  Administered 2020-10-28 – 2020-11-01 (×11): 500 mg via INTRAVENOUS
  Filled 2020-10-28 (×11): qty 100

## 2020-10-28 MED ORDER — DULOXETINE HCL 30 MG PO CPEP
60.0000 mg | ORAL_CAPSULE | Freq: Every day | ORAL | Status: DC
  Administered 2020-10-29 – 2020-11-01 (×4): 60 mg via ORAL
  Filled 2020-10-28 (×4): qty 2

## 2020-10-28 MED ORDER — PANTOPRAZOLE SODIUM 40 MG PO TBEC
40.0000 mg | DELAYED_RELEASE_TABLET | Freq: Every day | ORAL | Status: DC
  Administered 2020-10-29 – 2020-11-01 (×4): 40 mg via ORAL
  Filled 2020-10-28 (×4): qty 1

## 2020-10-28 MED ORDER — ALBUTEROL SULFATE HFA 108 (90 BASE) MCG/ACT IN AERS: 2.0000 | INHALATION_SPRAY | Freq: Four times a day (QID) | RESPIRATORY_TRACT | Status: DC | PRN

## 2020-10-28 MED ORDER — VANCOMYCIN HCL 1750 MG/350ML IV SOLN
1750.0000 mg | INTRAVENOUS | Status: DC
  Administered 2020-10-29: 1750 mg via INTRAVENOUS
  Filled 2020-10-28: qty 350

## 2020-10-28 MED ORDER — VANCOMYCIN HCL 750 MG/150ML IV SOLN
750.0000 mg | Freq: Once | INTRAVENOUS | Status: AC
  Administered 2020-10-28: 750 mg via INTRAVENOUS
  Filled 2020-10-28: qty 150

## 2020-10-28 MED ORDER — ACETAMINOPHEN 325 MG PO TABS
650.0000 mg | ORAL_TABLET | Freq: Four times a day (QID) | ORAL | Status: DC | PRN
  Administered 2020-10-28 – 2020-11-01 (×5): 650 mg via ORAL
  Filled 2020-10-28 (×5): qty 2

## 2020-10-28 MED ORDER — SODIUM CHLORIDE 0.9 % IV SOLN
2.0000 g | Freq: Three times a day (TID) | INTRAVENOUS | Status: DC
  Administered 2020-10-28 – 2020-10-29 (×4): 2 g via INTRAVENOUS
  Filled 2020-10-28 (×6): qty 2

## 2020-10-28 MED ORDER — INSULIN ASPART 100 UNIT/ML ~~LOC~~ SOLN
0.0000 [IU] | Freq: Three times a day (TID) | SUBCUTANEOUS | Status: DC
  Administered 2020-10-28 – 2020-10-29 (×2): 3 [IU] via SUBCUTANEOUS
  Administered 2020-10-29: 2 [IU] via SUBCUTANEOUS
  Administered 2020-10-30: 3 [IU] via SUBCUTANEOUS
  Administered 2020-10-30: 8 [IU] via SUBCUTANEOUS
  Administered 2020-10-31: 5 [IU] via SUBCUTANEOUS
  Administered 2020-10-31: 3 [IU] via SUBCUTANEOUS
  Administered 2020-10-31: 5 [IU] via SUBCUTANEOUS
  Administered 2020-11-01 (×2): 8 [IU] via SUBCUTANEOUS
  Filled 2020-10-28: qty 0.15

## 2020-10-28 MED ORDER — SODIUM CHLORIDE 0.9 % IV SOLN: 2.0000 g | INTRAVENOUS | Status: DC

## 2020-10-28 MED ORDER — ASPIRIN EC 81 MG PO TBEC
81.0000 mg | DELAYED_RELEASE_TABLET | Freq: Every day | ORAL | Status: DC
  Administered 2020-10-29 – 2020-11-01 (×4): 81 mg via ORAL
  Filled 2020-10-28 (×4): qty 1

## 2020-10-28 MED ORDER — PIPERACILLIN-TAZOBACTAM 3.375 G IVPB 30 MIN
3.3750 g | Freq: Once | INTRAVENOUS | Status: AC
  Administered 2020-10-28: 3.375 g via INTRAVENOUS
  Filled 2020-10-28: qty 50

## 2020-10-28 MED ORDER — INSULIN GLARGINE 100 UNIT/ML ~~LOC~~ SOLN
10.0000 [IU] | Freq: Every day | SUBCUTANEOUS | Status: DC
  Administered 2020-10-28 – 2020-11-01 (×5): 10 [IU] via SUBCUTANEOUS
  Filled 2020-10-28 (×5): qty 0.1

## 2020-10-28 MED ORDER — LACTATED RINGERS IV SOLN: INTRAVENOUS | Status: DC

## 2020-10-28 MED ORDER — VANCOMYCIN HCL IN DEXTROSE 1-5 GM/200ML-% IV SOLN: 1000.0000 mg | Freq: Once | INTRAVENOUS | Status: DC

## 2020-10-28 MED ORDER — MIRTAZAPINE 15 MG PO TABS
15.0000 mg | ORAL_TABLET | Freq: Every day | ORAL | Status: DC
  Administered 2020-10-28 – 2020-10-31 (×4): 15 mg via ORAL
  Filled 2020-10-28 (×2): qty 2
  Filled 2020-10-28: qty 1
  Filled 2020-10-28 (×2): qty 2

## 2020-10-28 MED ORDER — PHENOL 1.4 % MT LIQD
1.0000 | OROMUCOSAL | Status: DC | PRN
  Administered 2020-10-28: 1 via OROMUCOSAL
  Filled 2020-10-28: qty 177

## 2020-10-28 MED ORDER — SODIUM CHLORIDE 0.9% FLUSH
3.0000 mL | Freq: Two times a day (BID) | INTRAVENOUS | Status: DC
  Administered 2020-10-28 – 2020-10-31 (×5): 3 mL via INTRAVENOUS

## 2020-10-28 MED ORDER — ENOXAPARIN SODIUM 40 MG/0.4ML ~~LOC~~ SOLN
40.0000 mg | SUBCUTANEOUS | Status: DC
  Administered 2020-10-28 – 2020-10-31 (×4): 40 mg via SUBCUTANEOUS
  Filled 2020-10-28 (×4): qty 0.4

## 2020-10-28 MED ORDER — INSULIN ASPART 100 UNIT/ML ~~LOC~~ SOLN
10.0000 [IU] | Freq: Once | SUBCUTANEOUS | Status: AC
  Administered 2020-10-28: 10 [IU] via SUBCUTANEOUS
  Filled 2020-10-28: qty 0.1

## 2020-10-28 MED ORDER — AMLODIPINE BESYLATE 5 MG PO TABS: 10.0000 mg | ORAL_TABLET | Freq: Every day | ORAL | Status: DC

## 2020-10-28 MED ORDER — VANCOMYCIN HCL IN DEXTROSE 1-5 GM/200ML-% IV SOLN
1000.0000 mg | Freq: Once | INTRAVENOUS | Status: AC
  Administered 2020-10-28: 1000 mg via INTRAVENOUS
  Filled 2020-10-28: qty 200

## 2020-10-28 MED ORDER — LACTATED RINGERS IV BOLUS (SEPSIS): 500.0000 mL | Freq: Once | INTRAVENOUS | Status: DC

## 2020-10-28 NOTE — Progress Notes (Signed)
RN called Dr. Neysa Bonito to bedside for pt turning to RED MEWS 6. MD ordered fluid bolus, additional abx, and PRN tylenol. RN notified charge RN, Chancy Hurter and will continue to monitor.   10/28/20 1700  Assess: MEWS Score  Temp (!) 101.7 F (38.7 C)  BP (!) 95/57  Pulse Rate (!) 114  ECG Heart Rate (!) 112  Resp (!) 35  Level of Consciousness Alert  SpO2 95 %  O2 Device Room Air  Assess: MEWS Score  MEWS Temp 2  MEWS Systolic 1  MEWS Pulse 2  MEWS RR 2  MEWS LOC 0  MEWS Score 7  MEWS Score Color Red  Assess: if the MEWS score is Yellow or Red  Were vital signs taken at a resting state? Yes  Focused Assessment No change from prior assessment  Early Detection of Sepsis Score *See Row Information* High  MEWS guidelines implemented *See Row Information* Yes  Treat  Pain Scale 0-10  Pain Score 0  Take Vital Signs  Increase Vital Sign Frequency  Red: Q 1hr X 4 then Q 4hr X 4, if remains red, continue Q 4hrs  Escalate  MEWS: Escalate Red: discuss with charge nurse/RN and provider, consider discussing with RRT  Notify: Charge Nurse/RN  Name of Charge Nurse/RN Notified Chancy Hurter RN  Date Charge Nurse/RN Notified 10/28/20  Time Charge Nurse/RN Notified 1722  Notify: Provider  Provider Name/Title Marva Panda MD  Date Provider Notified 10/28/20  Time Provider Notified 1725  Notification Type Page  Notification Reason Other (Comment) (MEWS red 6)

## 2020-10-28 NOTE — Progress Notes (Signed)
Pt off the floor for MRI

## 2020-10-28 NOTE — ED Provider Notes (Signed)
Left foot great toe ulcer with drainage, necrotic tissue within wound and surrounding foot cellulitis. Has been taking left over doxycycline. Hasn't been checking his blood sugars. Subjective fevers. Osteomyelitis concern on brief exam with recommendations for further evaluation in the ER now. Patient verbalized understanding and agreeable to plan.       Zigmund Gottron, NP 10/28/20 [phone]

## 2020-10-28 NOTE — Progress Notes (Signed)
1320- RN received pt from ED, moved him over and connected him to tele monitor. Upon assessment, pt told RN that he "never checks my blood sugar, I'm on meds for that one of them is glipizide." RN informed pt that we will be checking his blood sugars before meals and at bedtime. Before RN could finish assessment, pt wants me to get him some food and water. RN informed pt he has water on bedside table and I will check diet for food orders.   1330- RN at bedside to assist pt to bathroom, got pt into bathroom where he had a BM all in the bathroom floor. His foot dressing was saturated in urine and stool. RN removed it. Cleaned pt up, changed all linens, washed arms, legs, and feet. RN re-dressed right foot wounds. RN cleaned up bathroom with bleach wipes and had housekeeping come mop as well.  1400- Pt's IV continues to beep due to location, however pt has IVF and IV abx running. RN discussed the importance of trying to keep arm still until these finish and we can saline lock one of the two IV's. Pt's mother at the bedside continues to state "well you could just move his IV to the left arm so that it is not an issue for him moving his right arm." RN explained to mother that once the first LR bolus finishes in left arm that was started in the ED for sepsis protocol, that arm will be saline locked until lab comes to draw blood work. Pt and mother agree with plan.

## 2020-10-28 NOTE — Progress Notes (Signed)
Notified bedside nurse of need to order repeat lactic acid.  Called the floor nurse caring for patient to ask for a 3rd lactic and also to see why the rest of the patients fluids were charted as not given. Beside RN has page out to the MD awaiting a return call.

## 2020-10-28 NOTE — Progress Notes (Signed)
Pharmacy Antibiotic Note  [name] [name] is a 60 y.o. male admitted on 10/28/2020 with sepsis without septic shock with concern for left foot osteomyelitis verus cellulitis in diabetic patient.  Pharmacy has been consulted for cefepime and vancomycin dosing.  Zosyn 3.375 g IV x 1 administered in ED today at 1109 Vancomycin 1 g IV x 1 administered in ED today at 1208.  Plan: Cefepime 2 g IV every 8 hours Vancomycin 750 mg IV x 1 then Vancomycin 1750 mg IV Q 24 hrs (Goal AUC 400-550, Expected AUC: 498.9, SCr used: 1.21) Monitor clinical picture, renal function, vancomycin levels if indicated F/U C&S, abx deescalation / LOT   Height: 5\' 11"  (180.3 cm) Weight: 76.2 kg (168 lb) IBW/kg (Calculated) : 75.3  Temp (24hrs), Avg:99.2 F (37.3 C), Min:99.2 F (37.3 C), Max:99.2 F (37.3 C)  Recent Labs  Lab 10/28/20 0930 10/28/20 1014  WBC  --  21.1*  CREATININE  --  1.21  LATICACIDVEN 1.9  --     Estimated Creatinine Clearance: 70.9 mL/min (by C-G formula based on SCr of 1.21 mg/dL).    Allergies  Allergen Reactions  . Bee Venom Anaphylaxis  . Lactose Intolerance (Gi) Other (See Comments)    GI upset    Antimicrobials this admission: Zosyn x 1 on 2/28 Vancomycin 2/28 >>  Cefepime 2/28 >>   Dose adjustments this admission:   Microbiology results: 2/28 BCx: sent 2/28 Wound Cx: sent  Thank you for allowing pharmacy to be a part of this patient's care.  Faiz Weber P. Legrand Como, PharmD, [place] Please utilize Amion for appropriate phone number to reach the unit pharmacist ([place]) 10/28/2020 1:10 PM

## 2020-10-28 NOTE — ED Triage Notes (Signed)
Pt is present today with pain in his left big toe. Pt states that his pain started last week. Pt stated that he noticed a blister on his toe two days ago and this morning the blister ruptured. Pt is diabetic and states that he had a slight fever this morning.  Pt states that he is taking antibiotics that started last night.

## 2020-10-28 NOTE — Consult Note (Signed)
WOC Nurse Consult Note: Reason for Consult:Ruptured blood filled blister to left great toe.  Chronic open wound to left plantar toe at great toe pad.  Patient reports fever and hyperglycemia.  X ray negative for osteomyelitis.  Awaiting further imaging. Has had vancomycin and is in maxipime.  Wound type:infectious Pressure Injury POA: NA Measurement: 2 cm x 6 cm circumferential lesion to left great toe, ruptured blood filled blister.  LEft plantar foot at great toe 2 cm x 2.5 cm with scabbing to wound bed.  Wound GYJ:EHUDJSHFWYO tissue.  Drainage (amount, consistency, odor) moderate serosanguinous  No odor.  Periwound:erythema and tenderness to left toe and foot, extending up to ankel.  Dressing procedure/placement/frequency: Cleanse left toe and plantar foot with NS and pat dry.  Apply Xeroform to open wounds. Cover with dry dressing and kerlix/tape.  Change daily.  Will not follow at this time.  Please re-consult if needed.  Domenic Moras MSN, RN, FNP-BC CWON Wound, Ostomy, Continence Nurse Pager [phone]

## 2020-10-28 NOTE — Progress Notes (Signed)
RN at bedside due to IV beeping. Pt reports he and his mother have been turning IV alarm off themselves. RN encouraged them to call me instead of turning it off. RN reiterated that once lab draws blood from the left arm, we will switch his IVF/abx line to the left arm. Pt and mom agree with plan.

## 2020-10-28 NOTE — Progress Notes (Signed)
RN paged MD for critical lactic of 2.5. Also made MD Neysa Bonito aware that I received a call from [facility] needing a 3rd lactic for sepsis protocol. MD reports he is going to discontinue the rest of sepsis protocol LR and repeat a lactic. RN will continue to eval and monitor.

## 2020-10-28 NOTE — H&P (Signed)
History and Physical        Hospital Admission Note Date: 10/28/2020  Patient name: [name] [name] Medical record number: [id number] Date of birth: 1961-01-26 Age: 60 y.o. Gender: male  PCP: [name], NP    Chief Complaint    Chief Complaint  Patient presents with  . Hyperglycemia  . infected toe  . Hypotension      HPI:   This is a 60 year old male with past medical history of HCV, GERD, Barrett's esophagus, diabetes, depression who presented to the ED today from an urgent care with complaints of left great toe pain since Friday. He was working in the woods with his friend over the past week and felt like his foot was rubbing against his shoe the whole time. Noted a blister on his toe 2 days ago and this morning the blister had ruptured.  Subjective fevers this morning.  He has been taking leftover doxycycline from a prior infection since last night for this.  He also recently had right hand cellulitis with abscesses and was prescribed clindamycin in January at an urgent care for this and they are currently healing.  Regarding his diabetes, states that his sugar is frequently 400+ but does not check frequently.    ED Course: Afebrile, tachycardic, tachypneic,  hypotensive (84/54) and resolved with fluids, on room air. Notable Labs: Sodium 128, K3.1, chloride 91, glucose 449, BUN 21, creatinine 1.21, WBC 21.1, Hb 14.4, INR 1.3, lactic acid 1.9, COVID-19 pending. Notable Imaging: CXR unremarkable.  Left foot XR-soft tissue swelling of the great toe and area of the first MTP joint without evidence of osteomyelitis, arthropathy or other acute findings. Patient received 2 L LR bolus, Zosyn, vancomycin.    Vitals:   10/28/20 1203 10/28/20 1230  BP: (!) 91/55 (!) 103/55  Pulse: (!) 117 (!) 121  Resp: (!) 25 (!) 22  Temp:    SpO2: 99% 100%     Review of Systems:  Review  of Systems  All other systems reviewed and are negative.   Medical/Social/Family History   Past Medical History: Past Medical History:  Diagnosis Date  . Barrett's esophagus dx 2016  . Bronchitis   . Depression   . Diabetes ([place])   . ED (erectile dysfunction)   . GERD (gastroesophageal reflux disease)   . Hepatitis C   . Hiatal hernia 10/2014   3cm  . Neuropathy     Past Surgical History:  Procedure Laterality Date  . APPENDECTOMY    . COLONOSCOPY N/A 02/16/2014   Procedure: COLONOSCOPY;  Surgeon: Gatha Mayer, MD;  Location: [facility];  Service: Endoscopy;  Laterality: N/A;  . COLONOSCOPY    . TONSILLECTOMY      Medications: Prior to Admission medications   Medication Sig Start Date End Date Taking? Authorizing Provider  albuterol (VENTOLIN HFA) 108 (90 Base) MCG/ACT inhaler Inhale 2 puffs into the lungs every 6 (six) hours as needed for wheezing or shortness of breath. 06/16/19  Yes [name], NP  amLODipine (NORVASC) 10 MG tablet Take 1 tablet (10 mg total) by mouth daily. Please fill as a 90 day supply 09/16/20 12/15/20 Yes [name], NP  aspirin EC 81 MG tablet  Take 1 tablet (81 mg total) by mouth daily. 04/02/17  Yes Funches, Josalyn, MD  atorvastatin (LIPITOR) 40 MG tablet Take 1 tablet (40 mg total) by mouth daily. Please fill as a 90 day supply. 09/16/20  Yes [name], NP  canagliflozin (INVOKANA) 300 MG TABS tablet Take 1 tablet (300 mg total) by mouth daily before breakfast. Please fill as a 90 day supply 09/16/20  Yes [name], NP  doxycycline (VIBRAMYCIN) 100 MG capsule Take 100 mg by mouth 2 (two) times daily.   Yes [provider]  DULoxetine (CYMBALTA) 60 MG capsule Take 1 capsule (60 mg total) by mouth daily. Please fill as a 90 day supply 09/16/20 12/15/20 Yes [name], NP  EPINEPHrine 0.3 mg/0.3 mL IJ SOAJ injection Inject 0.3 mg into the muscle as needed for anaphylaxis. 06/10/20  Yes [name], NP  folic  acid (FOLVITE) 1 MG tablet Take 1 tablet (1 mg total) by mouth daily. 05/21/20  Yes Eugenie Filler, MD  glipiZIDE (GLUCOTROL) 5 MG tablet Take 1 tablet (5 mg total) by mouth daily before breakfast. Please fill as a 90 day supply 09/16/20 12/15/20 Yes [name], NP  ibuprofen (ADVIL) 200 MG tablet Take 600 mg by mouth every 6 (six) hours as needed for fever, headache or mild pain.   Yes [provider]  loperamide (IMODIUM A-D) 2 MG tablet Take 1 tablet (2 mg total) by mouth 4 (four) times daily as needed for diarrhea or loose stools. 06/14/20  Yes [name], NP  loratadine (CLARITIN) 10 MG tablet Take 1 tablet (10 mg total) by mouth daily. Please fill as a 90 day supply 09/16/20 12/15/20 Yes [name], NP  losartan (COZAAR) 100 MG tablet Take 1 tablet (100 mg total) by mouth daily. Please fill as a 90 day supply 09/16/20  Yes [name], NP  mirtazapine (REMERON) 15 MG tablet Take 1 tablet (15 mg total) by mouth at bedtime. Please fill as a 90 day supply 09/16/20 12/15/20 Yes [name], NP  Multiple Vitamins-Minerals (MULTIVITAMIN ADULT) TABS Take 1 tablet by mouth daily. 04/02/17  Yes Funches, Josalyn, MD  omeprazole (PRILOSEC) 40 MG capsule Take 1 capsule (40 mg total) by mouth daily. Please fill as a 90 day supply 09/16/20 12/15/20 Yes [name], NP  mupirocin ointment (BACTROBAN) 2 % Apply to affected areas twice per day. Patient not taking: Reported on 10/28/2020 06/14/20   [name], NP    Allergies:   Allergies  Allergen Reactions  . Bee Venom Anaphylaxis  . Lactose Intolerance (Gi) Other (See Comments)    GI upset    Social History:  reports that he has been smoking cigarettes. He has a 17.50 pack-year smoking history. He has never used smokeless tobacco. He reports current alcohol use. He reports that he does not use drugs.  Family History: Family History  Problem Relation Age of Onset  . Cancer Mother        breast  . Diabetes  Mother   . Heart disease Maternal Grandfather   . Pancreatic cancer Paternal Grandmother   . Stroke Father   . Alcohol abuse Father   . Pancreatic cancer Father   . Colon cancer Neg Hx   . Stomach cancer Neg Hx   . Rectal cancer Neg Hx   . Esophageal cancer Neg Hx      Objective   Physical Exam: Blood pressure (!) 103/55, pulse (!) 121,  temperature 99.2 F (37.3 C), temperature source Oral, resp. rate (!) 22, height 5\' 11"  (1.803 m), weight 76.2 kg, SpO2 100 %.  Physical Exam Vitals and nursing note reviewed.  Constitutional:      Appearance: Normal appearance.  HENT:     Head: Normocephalic and atraumatic.  Eyes:     Conjunctiva/sclera: Conjunctivae normal.  Cardiovascular:     Rate and Rhythm: Normal rate and regular rhythm.  Pulmonary:     Effort: Pulmonary effort is normal.     Breath sounds: Normal breath sounds.  Abdominal:     General: Abdomen is flat.     Palpations: Abdomen is soft.  Musculoskeletal:       Arms:       Legs:  Skin:    Coloration: Skin is not jaundiced or pale.  Neurological:     Mental Status: He is alert. Mental status is at baseline.  Psychiatric:        Mood and Affect: Mood normal.        Behavior: Behavior normal.            LABS on Admission: I have personally reviewed all the labs and imaging below    Basic Metabolic Panel: Recent Labs  Lab 10/28/20 1014  NA 128*  K 3.1*  CL 91*  CO2 25  GLUCOSE 449*  BUN 21*  CREATININE 1.21  CALCIUM 8.6*   Liver Function Tests: Recent Labs  Lab 10/28/20 1014  AST 20  ALT 20  ALKPHOS 91  BILITOT 0.8  PROT 8.1  ALBUMIN 3.6   No results for input(s): LIPASE, AMYLASE in the last 168 hours. No results for input(s): AMMONIA in the last 168 hours. CBC: Recent Labs  Lab 10/28/20 1014  WBC 21.1*  NEUTROABS 19.8*  HGB 14.4  HCT 43.2  MCV 90.4  PLT 202   Cardiac Enzymes: No results for input(s): CKTOTAL, CKMB, CKMBINDEX, TROPONINI in the last 168  hours. BNP: Invalid input(s): POCBNP CBG: Recent Labs  Lab 10/28/20 0920  GLUCAP 60*    Radiological Exams on Admission:  DG Chest 2 View  Result Date: 10/28/2020 CLINICAL DATA:  Sepsis. EXAM: CHEST - 2 VIEW COMPARISON:  05/12/2020 FINDINGS: The lungs are clear without focal pneumonia, edema, pneumothorax or pleural effusion. Streaky/linear opacity at the left base compatible with atelectasis or scarring. The cardiopericardial silhouette is within normal limits for size. Telemetry leads overlie the chest. IMPRESSION: No active cardiopulmonary disease. Electronically Signed   By: Misty Stanley M.D.   On: 10/28/2020 10:12   DG Foot Complete Left  Result Date: 10/28/2020 CLINICAL DATA:  Left great toe pain and swelling.  Diabetes. EXAM: LEFT FOOT - COMPLETE 3+ VIEW COMPARISON:  10/10/2018 FINDINGS: There is no evidence of acute fracture or dislocation. No evidence of osteolysis or periostitis. Old fracture deformity of the proximal phalanx of the 2nd toe is noted. There is no evidence of arthropathy or other focal bone abnormality. Soft tissue swelling is seen involving the great toe and area of the 1st MTP joint. No evidence of soft tissue gas or radiopaque foreign body. IMPRESSION: Soft tissue swelling involving the great toe and area of the 1st MTP joint. No radiographic evidence of osteomyelitis, arthropathy, or other acute findings. Electronically Signed   By: Marlaine Hind M.D.   On: 10/28/2020 10:14      EKG: Not done   A & P   Principal Problem:   Sepsis ([place]) Active Problems:   Tobacco use disorder  Type 2 diabetes mellitus with hyperlipidemia ([facility])   Essential hypertension   Cellulitis in diabetic foot ([place])   1. Sepsis without septic shock with concern for left foot osteomyelitis versus cellulitis in diabetic patient a. Sepsis criteria: Tachycardia, tachypnea, leukocytosis with infectious source b. Left foot XR with soft tissue swelling but no osteo- c. Hypotension  improving with IV fluids, continue IV fluids d. Follow-up wound and blood cultures e. Left foot MRI f. Orthopedic surgery consult pending MRI results g. Vancomycin and cefepime h. WOCN  2. Type 2 diabetes with hyperglycemia a. Glucose 449 and reports his glucoses regularly 400+ b. Holding home meds c. Hemoglobin A1c d. Start on weight-based basal bolus insulin   3. Hypertension a. Hold home amlodipine, losartan due to hypotension  4. Hyperlipidemia a. Continue statin  5. GERD/Barrett's esophagus a. Continue PPI  6. Tobacco use a. Smokes 1-1.5 packs per day b. Advised cessation c. Declines nicotine patch d. Continue albuterol as needed  7. Hyponatremia a. Continue IV fluids  8. Elevated creatinine a. Baseline creatinine 0.76, currently 1.21 b. Follow-up after IV fluids and vancomycin/Zosyn     DVT prophylaxis: Lovenox   Code Status: DNR  Diet: Heart healthy/carb modified Family Communication: Admission, patients condition and plan of care including tests being ordered have been discussed with the patient who indicates understanding and agrees with the plan and Code Status. Patient's wife was updated  Disposition Plan: The appropriate patient status for this patient is INPATIENT. Inpatient status is judged to be reasonable and necessary in order to provide the required intensity of service to ensure the patient's safety. The patient's presenting symptoms, physical exam findings, and initial radiographic and laboratory data in the context of their chronic comorbidities is felt to place them at high risk for further clinical deterioration. Furthermore, it is not anticipated that the patient will be medically stable for discharge from the hospital within 2 midnights of admission. The following factors support the patient status of inpatient.   " The patient's presenting symptoms include Left foot pain with drainage. " The worrisome physical exam findings include left foot  swelling with serosanguineous and concern for purulent drainage with wound. " The initial radiographic and laboratory data are worrisome because of tachycardia, tachypnea, leukocytosis. " The chronic co-morbidities include poorly controlled diabetes.   * I certify that at the point of admission it is my clinical judgment that the patient will require inpatient hospital care spanning beyond 2 midnights from the point of admission due to high intensity of service, high risk for further deterioration and high frequency of surveillance required.*   Status is: Inpatient  Remains inpatient appropriate because:Ongoing diagnostic testing needed not appropriate for outpatient work up, IV treatments appropriate due to intensity of illness or inability to take PO and Inpatient level of care appropriate due to severity of illness   Dispo: The patient is from: Home              Anticipated d/c is to: Home              Patient currently is not medically stable to d/c.   Difficult to place patient No    Consultants  . None  Procedures  . None  Time Spent on Admission: 65 minutes    Harold Hedge, DO [facility]  10/28/2020, 12:43 PM

## 2020-10-28 NOTE — ED Triage Notes (Signed)
Patient's CBG in triage-481. Patient c/o infected left toe, but left foot redness also. Patient went to an UC this AM. Patient states he has been taking an old Rx for doxycycline for his toe/foot infection.

## 2020-10-28 NOTE — Plan of Care (Signed)
  Problem: Education: Goal: Knowledge of General Education information will improve Description: Including pain rating scale, medication(s)/side effects and non-pharmacologic comfort measures Outcome: Progressing   Problem: Clinical Measurements: Goal: Ability to maintain clinical measurements within normal limits will improve Outcome: Not Progressing Goal: Will remain free from infection Outcome: Not Progressing Goal: Cardiovascular complication will be avoided Outcome: Progressing   Problem: Activity: Goal: Risk for activity intolerance will decrease Outcome: Progressing   Problem: Nutrition: Goal: Adequate nutrition will be maintained Outcome: Progressing   Problem: Coping: Goal: Level of anxiety will decrease Outcome: Progressing   Problem: Elimination: Goal: Will not experience complications related to bowel motility Outcome: Progressing   Problem: Pain Managment: Goal: General experience of comfort will improve Outcome: Progressing   Problem: Safety: Goal: Ability to remain free from injury will improve Outcome: Progressing   Problem: Skin Integrity: Goal: Risk for impaired skin integrity will decrease Outcome: Not Progressing

## 2020-10-28 NOTE — ED Notes (Signed)
Patient transported to X-ray 

## 2020-10-28 NOTE — Progress Notes (Signed)
[facility]link monitoring sepsis

## 2020-10-28 NOTE — ED Notes (Signed)
Pt was instructed to go to the ED per provider for further evaluation.

## 2020-10-28 NOTE — ED Provider Notes (Signed)
[facility] DEPT Provider Note   CSN: [id number] Arrival date & time: 10/28/20  4235     History Chief Complaint  Patient presents with  . Hyperglycemia  . infected toe  . Hypotension    [name] [name] is a 60 y.o. male.  Patient presents to the emergency department with infected left toe.  He was seen recently and started on doxycycline.  Patient is on doxycycline.  Told is getting worse it is more swollen and red and tender he is got inflammation going up his ankle to  The history is provided by the patient and medical records. No language interpreter was used.  Toe Pain This is a new problem. The current episode started more than 2 days ago. The problem occurs constantly. The problem has been rapidly worsening. Pertinent negatives include no chest pain, no abdominal pain and no headaches. Nothing aggravates the symptoms. Nothing relieves the symptoms. He has tried nothing for the symptoms. The treatment provided no relief.       Past Medical History:  Diagnosis Date  . Barrett's esophagus dx 2016  . Bronchitis   . Depression   . Diabetes ([place])   . ED (erectile dysfunction)   . GERD (gastroesophageal reflux disease)   . Hepatitis C   . Hiatal hernia 10/2014   3cm  . Neuropathy     Patient Active Problem List   Diagnosis Date Noted  . Sepsis ([place]) 10/28/2020  . Community acquired pneumonia of right lower lobe of lung   . Elevated LFTs   . Hypophosphatemia   . Acute metabolic encephalopathy   . Acute maxillary sinusitis   . Depression   . Physical deconditioning   . Pneumonia 05/14/2020  . DTs (delirium tremens) ([place]) 05/13/2020  . Hyponatremia 05/13/2020  . Hypokalemia 05/13/2020  . PNA (pneumonia) 05/13/2020  . SIRS (systemic inflammatory response syndrome) ([place]) 05/13/2020  . Sepsis with acute hypoxic respiratory failure without septic shock ([place])   . Alcohol abuse with alcohol-induced mood disorder ([place]) 12/17/2017  .  Hereditary hemochromatosis ([place]) 11/23/2017  . 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 Essential hypertension 04/02/2017  . Hypogonadism male 08/13/2016  . ETOH abuse 06/22/2016  . Low serum testosterone level 01/08/2016  . Type 2 diabetes mellitus with hyperlipidemia ([place]) 01/02/2016  . Erectile dysfunction 09/12/2015  . Hepatic cirrhosis ([place]) 01/10/2015  . Chronic hepatitis C without hepatic coma ([place]) 10/31/2014  . Tobacco use disorder 08/03/2013    Past Surgical History:  Procedure Laterality Date  . APPENDECTOMY    . COLONOSCOPY N/A 02/16/2014   Procedure: COLONOSCOPY;  Surgeon: Gatha Mayer, MD;  Location: [facility] ENDOSCOPY;  Service: Endoscopy;  Laterality: N/A;  . COLONOSCOPY    . TONSILLECTOMY         Family History  Problem Relation Age of Onset  . Cancer Mother        breast  . Diabetes Mother   . Heart disease Maternal Grandfather   . Pancreatic cancer Paternal Grandmother   . Stroke Father   . Alcohol abuse Father   . Pancreatic cancer Father   . Colon cancer Neg Hx   . Stomach cancer Neg Hx   . Rectal cancer Neg Hx   . Esophageal cancer Neg Hx     Social History   Tobacco Use  . Smoking status: Current Every Day Smoker    Packs/day: 0.50    Years: 35.00    Pack years: 17.50    Types:  Cigarettes  . Smokeless tobacco: Never Used  . Tobacco comment: none x 3 weeks as of 05/22/2020  Vaping Use  . Vaping Use: Never used  Substance Use Topics  . Alcohol use: Yes    Comment: daily  . Drug use: No    Home Medications Prior to Admission medications   Medication Sig Start Date End Date Taking? Authorizing Provider  albuterol (VENTOLIN HFA) 108 (90 Base) MCG/ACT inhaler Inhale 2 puffs into the lungs every 6 (six) hours as needed for wheezing or shortness of breath. 06/16/19  Yes [name], NP  amLODipine (NORVASC) 10 MG tablet Take 1 tablet (10 mg total) by mouth daily. Please fill as a 90 day supply 09/16/20 12/15/20 Yes [name], NP  aspirin EC 81 MG tablet Take 1 tablet (81 mg total) by mouth daily. 04/02/17  Yes Funches, Josalyn, MD  atorvastatin (LIPITOR) 40 MG tablet Take 1 tablet (40 mg total) by mouth daily. Please fill as a 90 day supply. 09/16/20  Yes [name], NP  canagliflozin (INVOKANA) 300 MG TABS tablet Take 1 tablet (300 mg total) by mouth daily before breakfast. Please fill as a 90 day supply 09/16/20  Yes [name], NP  doxycycline (VIBRAMYCIN) 100 MG capsule Take 100 mg by mouth 2 (two) times daily.   Yes [provider]  DULoxetine (CYMBALTA) 60 MG capsule Take 1 capsule (60 mg total) by mouth daily. Please fill as a 90 day supply 09/16/20 12/15/20 Yes [name], NP  EPINEPHrine 0.3 mg/0.3 mL IJ SOAJ injection Inject 0.3 mg into the muscle as needed for anaphylaxis. 06/10/20  Yes [name], NP  folic acid (FOLVITE) 1 MG tablet Take 1 tablet (1 mg total) by mouth daily. 05/21/20  Yes Eugenie Filler, MD  glipiZIDE (GLUCOTROL) 5 MG tablet Take 1 tablet (5 mg total) by mouth daily before breakfast. Please fill as a 90 day supply 09/16/20 12/15/20 Yes [name], NP  ibuprofen (ADVIL) 200 MG tablet Take 600 mg by mouth every 6 (six) hours as needed for fever, headache or mild pain.   Yes [provider]  loperamide (IMODIUM A-D) 2 MG tablet Take 1 tablet (2 mg total) by mouth 4 (four) times daily as needed for diarrhea or loose stools. 06/14/20  Yes [name], NP  loratadine (CLARITIN) 10 MG tablet Take 1 tablet (10 mg total) by mouth daily. Please fill as a 90 day supply 09/16/20 12/15/20 Yes [name], NP  losartan (COZAAR) 100 MG tablet Take 1 tablet (100 mg total) by mouth daily. Please fill as a 90 day supply 09/16/20  Yes [name], NP  mirtazapine (REMERON) 15 MG tablet Take 1 tablet (15 mg total) by mouth at bedtime. Please fill as a 90 day supply 09/16/20 12/15/20 Yes [name], NP  Multiple Vitamins-Minerals (MULTIVITAMIN  ADULT) TABS Take 1 tablet by mouth daily. 04/02/17  Yes Funches, Josalyn, MD  omeprazole (PRILOSEC) 40 MG capsule Take 1 capsule (40 mg total) by mouth daily. Please fill as a 90 day supply 09/16/20 12/15/20 Yes [name], NP  mupirocin ointment (BACTROBAN) 2 % Apply to affected areas twice per day. Patient not taking: Reported on 10/28/2020 06/14/20   [name], NP    Allergies    Bee venom and Lactose intolerance (gi)  Review of Systems   Review of Systems  Constitutional: Negative for appetite change and fatigue.  HENT: Negative for congestion,  ear discharge and sinus pressure.   Eyes: Negative for discharge.  Respiratory: Negative for cough.   Cardiovascular: Negative for chest pain.  Gastrointestinal: Negative for abdominal pain and diarrhea.  Genitourinary: Negative for frequency and hematuria.  Musculoskeletal: Negative for back pain.       Swollen tender left large toe  Skin: Negative for rash.  Neurological: Negative for seizures and headaches.  Psychiatric/Behavioral: Negative for hallucinations.    Physical Exam Updated Vital Signs BP 109/65   Pulse (!) 114   Temp 99.2 F (37.3 C) (Oral)   Resp (!) 31   Ht 5\' 11"  (1.803 m)   Wt 76.2 kg   SpO2 99%   BMI 23.43 kg/m   Physical Exam Vitals reviewed.  Constitutional:      Appearance: He is well-developed.  HENT:     Head: Normocephalic.     Nose: Nose normal.  Eyes:     General: No scleral icterus.    Extraocular Movements: EOM normal.     Conjunctiva/sclera: Conjunctivae normal.  Neck:     Thyroid: No thyromegaly.  Cardiovascular:     Rate and Rhythm: Normal rate and regular rhythm.     Heart sounds: No murmur heard. No friction rub. No gallop.   Pulmonary:     Breath sounds: No stridor. No wheezing or rales.  Chest:     Chest wall: No tenderness.  Abdominal:     General: There is no distension.     Tenderness: There is no abdominal tenderness. There is no rebound.  Musculoskeletal:         General: No edema.     Cervical back: Neck supple.     Comments: Patient with a large cellulitis to left great toe.  Cellulitis going up his ankle.  Lymphadenopathy:     Cervical: No cervical adenopathy.  Skin:    Findings: No erythema or rash.  Neurological:     Mental Status: He is alert and oriented to person, place, and time.     Motor: No abnormal muscle tone.     Coordination: Coordination normal.  Psychiatric:        Mood and Affect: Mood and affect normal.        Behavior: Behavior normal.     ED Results / Procedures / Treatments   Labs (all labs ordered are listed, but only abnormal results are displayed) Labs Reviewed  COMPREHENSIVE METABOLIC PANEL - Abnormal; Notable for the following components:      Result Value   Sodium 128 (*)    Potassium 3.1 (*)    Chloride 91 (*)    Glucose, Bld 449 (*)    BUN 21 (*)    Calcium 8.6 (*)    All other components within normal limits  CBC WITH DIFFERENTIAL/PLATELET - Abnormal; Notable for the following components:   WBC 21.1 (*)    All other components within normal limits  PROTIME-INR - Abnormal; Notable for the following components:   Prothrombin Time 15.7 (*)    INR 1.3 (*)    All other components within normal limits  CBG MONITORING, ED - Abnormal; Notable for the following components:   Glucose-Capillary 481 (*)    All other components within normal limits  CULTURE, BLOOD (ROUTINE X 2)  CULTURE, BLOOD (ROUTINE X 2)  RESP PANEL BY RT-PCR (FLU A&B, COVID) ARPGX2  AEROBIC/ANAEROBIC CULTURE W GRAM STAIN (SURGICAL/DEEP WOUND)  LACTIC ACID, PLASMA  LACTIC ACID, PLASMA  URINALYSIS, ROUTINE W REFLEX MICROSCOPIC  CBG MONITORING,  ED    EKG None  Radiology DG Chest 2 View  Result Date: 10/28/2020 CLINICAL DATA:  Sepsis. EXAM: CHEST - 2 VIEW COMPARISON:  05/12/2020 FINDINGS: The lungs are clear without focal pneumonia, edema, pneumothorax or pleural effusion. Streaky/linear opacity at the left base compatible with  atelectasis or scarring. The cardiopericardial silhouette is within normal limits for size. Telemetry leads overlie the chest. IMPRESSION: No active cardiopulmonary disease. Electronically Signed   By: Misty Stanley M.D.   On: 10/28/2020 10:12   DG Foot Complete Left  Result Date: 10/28/2020 CLINICAL DATA:  Left great toe pain and swelling.  Diabetes. EXAM: LEFT FOOT - COMPLETE 3+ VIEW COMPARISON:  10/10/2018 FINDINGS: There is no evidence of acute fracture or dislocation. No evidence of osteolysis or periostitis. Old fracture deformity of the proximal phalanx of the 2nd toe is noted. There is no evidence of arthropathy or other focal bone abnormality. Soft tissue swelling is seen involving the great toe and area of the 1st MTP joint. No evidence of soft tissue gas or radiopaque foreign body. IMPRESSION: Soft tissue swelling involving the great toe and area of the 1st MTP joint. No radiographic evidence of osteomyelitis, arthropathy, or other acute findings. Electronically Signed   By: Marlaine Hind M.D.   On: 10/28/2020 10:14    Procedures Procedures   Medications Ordered in ED Medications  lactated ringers infusion (has no administration in time range)  lactated ringers bolus 1,000 mL (1,000 mLs Intravenous New Bag/Given 10/28/20 1043)    And  lactated ringers bolus 1,000 mL (1,000 mLs Intravenous New Bag/Given 10/28/20 1042)    And  lactated ringers bolus 500 mL (has no administration in time range)  vancomycin (VANCOCIN) IVPB 1000 mg/200 mL premix (1,000 mg Intravenous New Bag/Given 10/28/20 1108)  insulin aspart (novoLOG) injection 10 Units (has no administration in time range)  piperacillin-tazobactam (ZOSYN) IVPB 3.375 g (3.375 g Intravenous New Bag/Given 10/28/20 1109)    ED Course  I have reviewed the triage vital signs and the nursing notes.  Pertinent labs & imaging results that were available during my care of the patient were reviewed by me and considered in my medical decision  making (see chart for details).    MDM Rules/Calculators/A&P                          Patient will be admitted for cellulitis to left toe and left ankle.  Cultures done Final Clinical Impression(s) / ED Diagnoses Final diagnoses:  Ulcer of toe [facility])    Rx / DC Orders ED Discharge Orders    None       Milton Ferguson, MD 10/28/20 1205

## 2020-10-28 NOTE — Progress Notes (Signed)
Notified of [facility] for Fever (101.62F), hypotension (95/57) and hypoxia (88%)  At bedside patient is diaphoretic but appears comfortable. Hypoxia resolved with sitting upright so this is likely atelectasis.   - Will give an additional 500 cc bolus IV fluids - Tylenol - Incentive spirometer - Add metronidazole to vancomycin and cefepime

## 2020-10-29 DIAGNOSIS — A419 Sepsis, unspecified organism: Secondary | ICD-10-CM

## 2020-10-29 LAB — BLOOD CULTURE ID PANEL (REFLEXED) - BCID2

## 2020-10-29 LAB — CBC
HCT: 35.7 % — ABNORMAL LOW (ref 39.0–52.0)
Hemoglobin: 12.1 g/dL — ABNORMAL LOW (ref 13.0–17.0)
MCH: 30.5 pg (ref 26.0–34.0)
MCHC: 33.9 g/dL (ref 30.0–36.0)
MCV: 89.9 fL (ref 80.0–100.0)
Platelets: 169 10*3/uL (ref 150–400)
RBC: 3.97 MIL/uL — ABNORMAL LOW (ref 4.22–5.81)
RDW: 12.7 % (ref 11.5–15.5)
WBC: 17.3 10*3/uL — ABNORMAL HIGH (ref 4.0–10.5)
nRBC: 0 % (ref 0.0–0.2)

## 2020-10-29 LAB — BASIC METABOLIC PANEL
Anion gap: 8 (ref 5–15)
BUN: 18 mg/dL (ref 6–20)
CO2: 22 mmol/L (ref 22–32)
Calcium: 7.8 mg/dL — ABNORMAL LOW (ref 8.9–10.3)
Chloride: 102 mmol/L (ref 98–111)
Creatinine, Ser: 0.68 mg/dL (ref 0.61–1.24)
GFR, Estimated: >60 mL/min (ref >60)
Glucose, Bld: 184 mg/dL — ABNORMAL HIGH (ref 70–99)
Potassium: 2.9 mmol/L — ABNORMAL LOW (ref 3.5–5.1)
Sodium: 132 mmol/L — ABNORMAL LOW (ref 135–145)

## 2020-10-29 LAB — GLUCOSE, CAPILLARY
Glucose-Capillary: 77 mg/dL (ref 70–99)
Glucose-Capillary: 168 mg/dL — ABNORMAL HIGH (ref 70–99)
Glucose-Capillary: 137 mg/dL — ABNORMAL HIGH (ref 70–99)
Glucose-Capillary: 153 mg/dL — ABNORMAL HIGH (ref 70–99)

## 2020-10-29 MED ORDER — SODIUM CHLORIDE 0.9 % IV SOLN
2.0000 g | Freq: Every day | INTRAVENOUS | Status: DC
  Administered 2020-10-30 – 2020-10-31 (×3): 2 g via INTRAVENOUS
  Filled 2020-10-29: qty 20
  Filled 2020-10-29: qty 2
  Filled 2020-10-29: qty 20

## 2020-10-29 MED ORDER — SODIUM CHLORIDE 0.9 % IV SOLN: INTRAVENOUS | Status: DC

## 2020-10-29 MED ORDER — NICOTINE 21 MG/24HR TD PT24
21.0000 mg | MEDICATED_PATCH | Freq: Every day | TRANSDERMAL | Status: DC
  Administered 2020-10-29 – 2020-11-01 (×4): 21 mg via TRANSDERMAL
  Filled 2020-10-29 (×4): qty 1
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
## 2020-10-29 NOTE — Progress Notes (Addendum)
PHARMACY - PHYSICIAN COMMUNICATION CRITICAL VALUE ALERT - BLOOD CULTURE IDENTIFICATION (BCID)  [name] [name] is an 60 y.o. male who presented to [facility] on 10/28/2020 with a chief complaint of diabetic foot cellulitis. No osteo or abscess on MRI  Assessment:  GAS in 1/4 BCx bottles (likely foot wound source as GAS and Klebsiella growing in foot wound). Would explain why patient failed (self-prescribed) doxycycline.  Name of physician (or Provider) Contacted: Opyd  Current antibiotics: Vanc, cefepime, Flagyl  Changes to prescribed antibiotics recommended: narrow to Ceftriaxone/Flagyl  Abx changed per BCID protocol; overnight provider notified; TRH to re-assess in AM  Risk of MRSA/pseudomonas seems very low given no Hx and not growing in Cx  Can consider adding clindamycin or Zyvox if pt develops shock or worsening hypotension  Results for orders placed or performed during the hospital encounter of 10/28/20  Blood Culture ID Panel (Reflexed) (Collected: 10/28/2020  9:35 AM)  Result Value Ref Range   Enterococcus faecalis NOT DETECTED NOT DETECTED   Enterococcus Faecium NOT DETECTED NOT DETECTED   Listeria monocytogenes NOT DETECTED NOT DETECTED   Staphylococcus species NOT DETECTED NOT DETECTED   Staphylococcus aureus (BCID) NOT DETECTED NOT DETECTED   Staphylococcus epidermidis NOT DETECTED NOT DETECTED   Staphylococcus lugdunensis NOT DETECTED NOT DETECTED   Streptococcus species DETECTED (A) NOT DETECTED   Streptococcus agalactiae NOT DETECTED NOT DETECTED   Streptococcus pneumoniae NOT DETECTED NOT DETECTED   Streptococcus pyogenes DETECTED (A) NOT DETECTED   A.calcoaceticus-baumannii NOT DETECTED NOT DETECTED   Bacteroides fragilis NOT DETECTED NOT DETECTED   Enterobacterales NOT DETECTED NOT DETECTED   Enterobacter cloacae complex NOT DETECTED NOT DETECTED   Escherichia coli NOT DETECTED NOT DETECTED   Klebsiella aerogenes NOT DETECTED NOT DETECTED   Klebsiella oxytoca  NOT DETECTED NOT DETECTED   Klebsiella pneumoniae NOT DETECTED NOT DETECTED   Proteus species NOT DETECTED NOT DETECTED   Salmonella species NOT DETECTED NOT DETECTED   Serratia marcescens NOT DETECTED NOT DETECTED   Haemophilus influenzae NOT DETECTED NOT DETECTED   Neisseria meningitidis NOT DETECTED NOT DETECTED   Pseudomonas aeruginosa NOT DETECTED NOT DETECTED   Stenotrophomonas maltophilia NOT DETECTED NOT DETECTED   Candida albicans NOT DETECTED NOT DETECTED   Candida auris NOT DETECTED NOT DETECTED   Candida glabrata NOT DETECTED NOT DETECTED   Candida krusei NOT DETECTED NOT DETECTED   Candida parapsilosis NOT DETECTED NOT DETECTED   Candida tropicalis NOT DETECTED NOT DETECTED   Cryptococcus neoformans/gattii NOT DETECTED NOT DETECTED    Petrona Wyeth A 10/29/2020  8:50 PM

## 2020-10-30 ENCOUNTER — Inpatient Hospital Stay (HOSPITAL_COMMUNITY): Payer: Medicaid Other

## 2020-10-30 DIAGNOSIS — R7881 Bacteremia: Secondary | ICD-10-CM

## 2020-10-30 LAB — COMPREHENSIVE METABOLIC PANEL
ALT: 18 U/L (ref 0–44)
AST: 21 U/L (ref 15–41)
Albumin: 2.6 g/dL — ABNORMAL LOW (ref 3.5–5.0)
Alkaline Phosphatase: 81 U/L (ref 38–126)
Anion gap: 11 (ref 5–15)
BUN: 11 mg/dL (ref 6–20)
CO2: 18 mmol/L — ABNORMAL LOW (ref 22–32)
Calcium: 7.6 mg/dL — ABNORMAL LOW (ref 8.9–10.3)
Chloride: 101 mmol/L (ref 98–111)
Creatinine, Ser: 0.52 mg/dL — ABNORMAL LOW (ref 0.61–1.24)
GFR, Estimated: >60 mL/min (ref >60)
Glucose, Bld: 140 mg/dL — ABNORMAL HIGH (ref 70–99)
Potassium: 2.5 mmol/L — CL (ref 3.5–5.1)
Sodium: 130 mmol/L — ABNORMAL LOW (ref 135–145)
Total Bilirubin: 1.3 mg/dL — ABNORMAL HIGH (ref 0.3–1.2)
Total Protein: 5.9 g/dL — ABNORMAL LOW (ref 6.5–8.1)

## 2020-10-30 LAB — BASIC METABOLIC PANEL
Anion gap: 8 (ref 5–15)
BUN: 9 mg/dL (ref 6–20)
CO2: 22 mmol/L (ref 22–32)
Calcium: 7.8 mg/dL — ABNORMAL LOW (ref 8.9–10.3)
Chloride: 101 mmol/L (ref 98–111)
Creatinine, Ser: 0.45 mg/dL — ABNORMAL LOW (ref 0.61–1.24)
GFR, Estimated: >60 mL/min (ref >60)
Glucose, Bld: 217 mg/dL — ABNORMAL HIGH (ref 70–99)
Potassium: 3 mmol/L — ABNORMAL LOW (ref 3.5–5.1)
Sodium: 131 mmol/L — ABNORMAL LOW (ref 135–145)

## 2020-10-30 LAB — ECHOCARDIOGRAM COMPLETE
Area-P 1/2: 5.66 cm2
Height: 71 in
S' Lateral: 2.8 cm
Weight: 2688 oz

## 2020-10-30 LAB — CBC
HCT: 34.7 % — ABNORMAL LOW (ref 39.0–52.0)
Hemoglobin: 11.4 g/dL — ABNORMAL LOW (ref 13.0–17.0)
MCH: 29.7 pg (ref 26.0–34.0)
MCHC: 32.9 g/dL (ref 30.0–36.0)
MCV: 90.4 fL (ref 80.0–100.0)
Platelets: 179 10*3/uL (ref 150–400)
RBC: 3.84 MIL/uL — ABNORMAL LOW (ref 4.22–5.81)
RDW: 12.8 % (ref 11.5–15.5)
WBC: 16.2 10*3/uL — ABNORMAL HIGH (ref 4.0–10.5)
nRBC: 0 % (ref 0.0–0.2)

## 2020-10-30 LAB — GLUCOSE, CAPILLARY
Glucose-Capillary: 112 mg/dL — ABNORMAL HIGH (ref 70–99)
Glucose-Capillary: 253 mg/dL — ABNORMAL HIGH (ref 70–99)
Glucose-Capillary: 167 mg/dL — ABNORMAL HIGH (ref 70–99)
Glucose-Capillary: 276 mg/dL — ABNORMAL HIGH (ref 70–99)

## 2020-10-30 LAB — SEDIMENTATION RATE: Sed Rate: 82 mm/hr — ABNORMAL HIGH (ref 0–16)

## 2020-10-30 LAB — C-REACTIVE PROTEIN: CRP: 19.6 mg/dL — ABNORMAL HIGH (ref <1.0)

## 2020-10-30 LAB — MAGNESIUM: Magnesium: 2 mg/dL (ref 1.7–2.4)

## 2020-10-30 MED ORDER — MAGNESIUM SULFATE IN D5W 1-5 GM/100ML-% IV SOLN
1.0000 g | Freq: Once | INTRAVENOUS | Status: AC
  Administered 2020-10-30: 1 g via INTRAVENOUS
  Filled 2020-10-30: qty 100

## 2020-10-30 MED ORDER — POTASSIUM CHLORIDE CRYS ER 20 MEQ PO TBCR
40.0000 meq | EXTENDED_RELEASE_TABLET | Freq: Once | ORAL | Status: AC
  Administered 2020-10-30: 40 meq via ORAL
  Filled 2020-10-30: qty 2

## 2020-10-30 MED ORDER — POTASSIUM CHLORIDE 10 MEQ/100ML IV SOLN
10.0000 meq | INTRAVENOUS | Status: AC
  Administered 2020-10-30 (×4): 10 meq via INTRAVENOUS
  Filled 2020-10-30 (×4): qty 100

## 2020-10-30 MED ORDER — SODIUM CHLORIDE 0.9 % IV SOLN
INTRAVENOUS | Status: DC | PRN
  Administered 2020-10-30: 250 mL via INTRAVENOUS

## 2020-10-30 NOTE — Progress Notes (Signed)
  Echocardiogram 2D Echocardiogram has been performed.  [name] [name] 10/30/2020, 2:46 PM

## 2020-10-30 NOTE — Plan of Care (Signed)
  Problem: Education: Goal: Knowledge of General Education information will improve Description: Including pain rating scale, medication(s)/side effects and non-pharmacologic comfort measures Outcome: Progressing   Problem: Coping: Goal: Level of anxiety will decrease Outcome: Progressing   Problem: Elimination: Goal: Will not experience complications related to urinary retention Outcome: Progressing   Problem: Pain Managment: Goal: General experience of comfort will improve Outcome: Progressing   Problem: Safety: Goal: Ability to remain free from injury will improve Outcome: Progressing   Problem: Skin Integrity: Goal: Risk for impaired skin integrity will decrease Outcome: Progressing   Problem: Clinical Measurements: Goal: Signs and symptoms of infection will decrease Outcome: Progressing   Problem: Respiratory: Goal: Ability to maintain adequate ventilation will improve Outcome: Progressing

## 2020-10-30 NOTE — Progress Notes (Signed)
   10/30/20 0453  Provider Notification  Provider Name/Title T. Opyd, MD  Date Provider Notified 10/30/20  Time Provider Notified [phone]  Notification Type Call  Notification Reason Critical result  Test performed and critical result K 2.5  Date Critical Result Received 10/30/20  Time Critical Result Received 5573  Provider response See new orders  Date of Provider Response 10/30/20  Time of Provider Response [phone]

## 2020-10-30 NOTE — Plan of Care (Signed)

## 2020-10-30 NOTE — Progress Notes (Signed)
PROGRESS NOTE    [name] [name]  UEA:[id number] DOB: 1960-10-31 DOA: 10/28/2020 PCP: [name], NP    Brief Narrative: HPI per Dr. Neysa Bonito on 10/28/2020. 60 year old male with past medical history of HCV, GERD, Barrett's esophagus, diabetes, depression who presented to the ED today from an urgent care with complaints of left great toe pain since Friday. He was working in the woods with his friend over the past week and felt like his foot was rubbing against his shoe the whole time. Noted a blister on his toe 2 days ago and this morning the blister had ruptured.  Subjective fevers this morning.  He has been taking leftover doxycycline from a prior infection since last night for this.  He also recently had right hand cellulitis with abscesses and was prescribed clindamycin in January at an urgent care for this and they are currently healing.  Regarding his diabetes, states that his sugar is frequently 400+ but does not check frequently.    ED Course: Afebrile, tachycardic, tachypneic,  hypotensive (84/54) and resolved with fluids, on room air. Notable Labs: Sodium 128, K3.1, chloride 91, glucose 449, BUN 21, creatinine 1.21, WBC 21.1, Hb 14.4, INR 1.3, lactic acid 1.9, COVID-19 pending. Notable Imaging: CXR unremarkable.  Left foot XR-soft tissue swelling of the great toe and area of the first MTP joint without evidence of osteomyelitis, arthropathy or other acute findings. Patient received 2 L LR bolus, Zosyn, vancomycin.   Assessment & Plan:   Principal Problem:   Sepsis ([place]) Active Problems:   Tobacco use disorder   Type 2 diabetes mellitus with hyperlipidemia ([place])   Essential hypertension   Cellulitis in diabetic foot ([place])   #1 sepsis with septic shock secondary to left foot cellulitis/diabetic foot present on admission-patient met sepsis criteria on admission with tachypnea respiratory rate 35, fever of 101.7 heart rate above 120 tachycardia and leukocytosis White count 21.1,  lactic acid 2.5, with endorgan damage such as AKI creatinine on presentation was 1.21 with his baseline creatinine of 0.75. He was started on IV fluids per sepsis protocol with improvement in his blood pressure. He was being treated with Vanco cefepime and Flagyl. Wound culture growing Klebsiella oxytoca sensitive to Rocephin Blood culture growing group A strep sensitivity pending Antibiotics changed to Rocephin and Flagyl 10/30/2020 Check echocardiogram with bacteremia to rule out endocarditis MRI of the left foot did not reveal evidence of osteomyelitis but showed severe cellulitis. Follow-up cultures. Today he still remains tachypneic at 24, tachycardic at 112, afebrile. Foot still remains edematous warm to touch with erythema. Still with leukocytosis though improving.  White count 16.7 today from 21.  Wound consult noted-Measurement: 2 cm x 6 cm circumferential lesion to left great toe, ruptured blood filled blister.  LEft plantar foot at great toe 2 cm x 2.5 cm with scabbing to wound bed.  Wound YNW:GNFAOZHYQMV tissue.   #2 uncontrolled type 2 diabetes with hyperglycemia with an A1c of 11.2 with a blood sugar of 449.  Patient reports his blood sugar usually runs in the four hundreds at home.  I will consult diabetic coordinator. CBG (last 3)  Recent Labs    10/29/20 2113 10/30/20 0733 10/30/20 1116  GLUCAP 153* 112* 253*   Continue current insulin.  #3 history of hypertension patient is on amlodipine and losartan at home which has been on hold due to hypotension which is thought to be due to sepsis. Blood pressure is still soft at 128/75.  #4 history of hyperlipidemia on statin  #  5 history of tobacco use he asked for the nicotine patch today will order for today.  #6 history of Barrett's esophagus continue PPI.  #7 hyponatremia -sodium 130.   #8 hypokalemia replete and recheck labs check magnesium.  Estimated body mass index is 23.43 kg/m as calculated from the  following:   Height as of this encounter: '5\' 11"'  (1.803 m).   Weight as of this encounter: 76.2 kg.  DVT prophylaxis: Lovenox Code Status: DNR Family Communication none at bedside  disposition Plan:  Status is: Inpatient  Dispo: The patient is from: Home              Anticipated d/c is to: Home              Patient currently is not medically stable to d/c.   Difficult to place patient-na   Consultants: None  Procedures: None Antimicrobials: Vancomycin and cefepime  Subjective: He is resting in bed awake and alert reports that he did not sleep last night.  Reporting that his mouth is very dry asking for saliva drops. Planes of left lower extremity pain and discomfort.  Objective: Vitals:   10/29/20 1038 10/29/20 1411 10/29/20 2116 10/30/20 0530  BP: 103/60 (!) 101/57 120/70 128/75  Pulse: (!) 103 93 88 88  Resp: (!) 22 (!) 22 20   Temp: 99.5 F (37.5 C) 98.3 F (36.8 C) 98.6 F (37 C) 98.9 F (37.2 C)  TempSrc: Oral Oral Oral Oral  SpO2: 97% 98% 99% 98%  Weight:      Height:        Intake/Output Summary (Last 24 hours) at 10/30/2020 1252 Last data filed at 10/30/2020 1200 Gross per 24 hour  Intake 3647.66 ml  Output 2775 ml  Net 872.66 ml   Filed Weights   10/28/20 0925  Weight: 76.2 kg    Examination:  General exam: Appears calm and comfortable  Respiratory system: Clear to auscultation. Respiratory effort normal. Cardiovascular system: S1 & S2 heard, RRR. No JVD, murmurs, rubs, gallops or clicks. No pedal edema. Gastrointestinal system: Abdomen is nondistended, soft and nontender. No organomegaly or masses felt. Normal bowel sounds heard. Central nervous system: Alert and oriented. No focal neurological deficits. Extremities: Left foot covered in dressing.   Skin: Left foot ulcer  psychiatry: Judgement and insight appear normal. Mood & affect appropriate.     Data Reviewed: I have personally reviewed following labs and imaging studies  CBC: Recent Labs   Lab 10/28/20 1014 10/29/20 1140 10/30/20 0354  WBC 21.1* 17.3* 16.2*  NEUTROABS 19.8*  --   --   HGB 14.4 12.1* 11.4*  HCT 43.2 35.7* 34.7*  MCV 90.4 89.9 90.4  PLT 202 169 604   Basic Metabolic Panel: Recent Labs  Lab 10/28/20 1014 10/29/20 1140 10/30/20 0354  NA 128* 132* 130*  K 3.1* 2.9* 2.5*  CL 91* 102 101  CO2 25 22 18*  GLUCOSE 449* 184* 140*  BUN 21* 18 11  CREATININE 1.21 0.68 0.52*  CALCIUM 8.6* 7.8* 7.6*   GFR: Estimated Creatinine Clearance: 107.2 mL/min (A) (by C-G formula based on SCr of 0.52 mg/dL (L)). Liver Function Tests: Recent Labs  Lab 10/28/20 1014 10/30/20 0354  AST 20 21  ALT 20 18  ALKPHOS 91 81  BILITOT 0.8 1.3*  PROT 8.1 5.9*  ALBUMIN 3.6 2.6*   No results for input(s): LIPASE, AMYLASE in the last 168 hours. No results for input(s): AMMONIA in the last 168 hours. Coagulation Profile: Recent Labs  Lab 10/28/20 1014  INR 1.3*   Cardiac Enzymes: No results for input(s): CKTOTAL, CKMB, CKMBINDEX, TROPONINI in the last 168 hours. BNP (last 3 results) No results for input(s): PROBNP in the last 8760 hours. HbA1C: Recent Labs    10/28/20 1014  HGBA1C 11.2*   CBG: Recent Labs  Lab 10/29/20 1115 10/29/20 1647 10/29/20 2113 10/30/20 0733 10/30/20 1116  GLUCAP 168* 137* 153* 112* 253*   Lipid Profile: No results for input(s): CHOL, HDL, LDLCALC, TRIG, CHOLHDL, LDLDIRECT in the last 72 hours. Thyroid Function Tests: No results for input(s): TSH, T4TOTAL, FREET4, T3FREE, THYROIDAB in the last 72 hours. Anemia Panel: No results for input(s): VITAMINB12, FOLATE, FERRITIN, TIBC, IRON, RETICCTPCT in the last 72 hours. Sepsis Labs: Recent Labs  Lab 10/28/20 0930 10/28/20 1336 10/28/20 1602 10/28/20 1901  LATICACIDVEN 1.9 2.5* 1.7 1.0    Recent Results (from the past 240 hour(s))  Culture, blood (Routine x 2)     Status: None (Preliminary result)   Collection Time: 10/28/20  9:30 AM   Specimen: BLOOD  Result Value Ref  Range Status   Specimen Description   Final    BLOOD RIGHT ANTECUBITAL Performed at [facility] [address] [place], [place] [address]    Special Requests   Final    BOTTLES DRAWN AEROBIC AND ANAEROBIC Blood Culture adequate volume Performed at [facility] [address] [place], [place] [address]    Culture   Final    NO GROWTH 2 DAYS Performed at [facility] [address], [place], [place] [address]    Report Status PENDING  Incomplete  Culture, blood (Routine x 2)     Status: Abnormal (Preliminary result)   Collection Time: 10/28/20  9:35 AM   Specimen: BLOOD  Result Value Ref Range Status   Specimen Description   Final    BLOOD LEFT ANTECUBITAL Performed at [facility], [name] [address] [place], [place] [address]    Special Requests   Final    BOTTLES DRAWN AEROBIC AND ANAEROBIC Blood Culture adequate volume Performed at [facility] [address], [place], [place] [address]    Culture  Setup Time   Final    GRAM POSITIVE COCCI IN CHAINS ANAEROBIC BOTTLE ONLY CRITICAL RESULT CALLED TO, READ BACK BY AND VERIFIED WITH: PHARMD DREW W. 2031 [id number] FCP Performed at [facility], [place] [address] [place], [place] [address]    Culture GROUP A STREP (S.PYOGENES) ISOLATED (A)  Final   Report Status PENDING  Incomplete  Blood Culture ID Panel (Reflexed)     Status: Abnormal   Collection Time: 10/28/20  9:35 AM  Result Value Ref Range Status   Enterococcus faecalis NOT DETECTED NOT DETECTED Final   Enterococcus Faecium NOT DETECTED NOT DETECTED Final   Listeria monocytogenes NOT DETECTED NOT DETECTED Final   Staphylococcus species NOT DETECTED NOT DETECTED Final   Staphylococcus aureus (BCID) NOT DETECTED NOT DETECTED Final   Staphylococcus epidermidis NOT DETECTED NOT DETECTED Final   Staphylococcus lugdunensis NOT DETECTED NOT DETECTED Final   Streptococcus species DETECTED (A) NOT DETECTED  Final    Comment: CRITICAL RESULT CALLED TO, READ BACK BY AND VERIFIED WITH: PHARMD DREW W. 2031 [id number] FCP    Streptococcus agalactiae NOT DETECTED NOT DETECTED Final   Streptococcus pneumoniae NOT DETECTED NOT DETECTED Final   Streptococcus pyogenes DETECTED (A) NOT DETECTED Final    Comment: CRITICAL RESULT CALLED TO, READ BACK  BY AND VERIFIED WITH: PHARMD DREW W. 2031 [id number] FCP    A.calcoaceticus-baumannii NOT DETECTED NOT DETECTED Final   Bacteroides fragilis NOT DETECTED NOT DETECTED Final   Enterobacterales NOT DETECTED NOT DETECTED Final   Enterobacter cloacae complex NOT DETECTED NOT DETECTED Final   Escherichia coli NOT DETECTED NOT DETECTED Final   Klebsiella aerogenes NOT DETECTED NOT DETECTED Final   Klebsiella oxytoca NOT DETECTED NOT DETECTED Final   Klebsiella pneumoniae NOT DETECTED NOT DETECTED Final   Proteus species NOT DETECTED NOT DETECTED Final   Salmonella species NOT DETECTED NOT DETECTED Final   Serratia marcescens NOT DETECTED NOT DETECTED Final   Haemophilus influenzae NOT DETECTED NOT DETECTED Final   Neisseria meningitidis NOT DETECTED NOT DETECTED Final   Pseudomonas aeruginosa NOT DETECTED NOT DETECTED Final   Stenotrophomonas maltophilia NOT DETECTED NOT DETECTED Final   Candida albicans NOT DETECTED NOT DETECTED Final   Candida auris NOT DETECTED NOT DETECTED Final   Candida glabrata NOT DETECTED NOT DETECTED Final   Candida krusei NOT DETECTED NOT DETECTED Final   Candida parapsilosis NOT DETECTED NOT DETECTED Final   Candida tropicalis NOT DETECTED NOT DETECTED Final   Cryptococcus neoformans/gattii NOT DETECTED NOT DETECTED Final    Comment: Performed at [facility], [address], [place], [place] [address]  Resp Panel by RT-PCR (Flu A&B, Covid) Nasopharyngeal Swab     Status: None   Collection Time: 10/28/20  9:58 AM   Specimen: Nasopharyngeal Swab; Nasopharyngeal(NP) swabs in vial transport medium  Result Value Ref Range Status    SARS Coronavirus 2 by RT PCR NEGATIVE NEGATIVE Final    Comment: (NOTE) SARS-CoV-2 target nucleic acids are NOT DETECTED.  The SARS-CoV-2 RNA is generally detectable in upper respiratory specimens during the acute phase of infection. The lowest concentration of SARS-CoV-2 viral copies this assay can detect is 138 copies/mL. A negative result does not preclude SARS-Cov-2 infection and should not be used as the sole basis for treatment or other patient management decisions. A negative result may occur with  improper specimen collection/handling, submission of specimen other than nasopharyngeal swab, presence of viral mutation(s) within the areas targeted by this assay, and inadequate number of viral copies(<138 copies/mL). A negative result must be combined with clinical observations, patient history, and epidemiological information. The expected result is Negative.  Fact Sheet for Patients:  EntrepreneurPulse.com.au  Fact Sheet for Healthcare Providers:  IncredibleEmployment.be  This test is no t yet approved or cleared by the [place] FDA and  has been authorized for detection and/or diagnosis of SARS-CoV-2 by FDA under an Emergency Use Authorization (EUA). This EUA will remain  in effect (meaning this test can be used) for the duration of the COVID-19 declaration under Section 564(b)(1) of the Act, 21 U.S.C.section 360bbb-3(b)(1), unless the authorization is terminated  or revoked sooner.       Influenza A by PCR NEGATIVE NEGATIVE Final   Influenza B by PCR NEGATIVE NEGATIVE Final    Comment: (NOTE) The Xpert Xpress SARS-CoV-2/FLU/RSV plus assay is intended as an aid in the diagnosis of influenza from Nasopharyngeal swab specimens and should not be used as a sole basis for treatment. Nasal washings and aspirates are unacceptable for Xpert Xpress SARS-CoV-2/FLU/RSV testing.  Fact Sheet for  Patients: EntrepreneurPulse.com.au  Fact Sheet for Healthcare Providers: IncredibleEmployment.be  This test is not yet approved or cleared by the [place] FDA and has been authorized for detection and/or diagnosis of SARS-CoV-2 by FDA under an Emergency Use Authorization (  EUA). This EUA will remain in effect (meaning this test can be used) for the duration of the COVID-19 declaration under Section 564(b)(1) of the Act, 21 U.S.C. section 360bbb-3(b)(1), unless the authorization is terminated or revoked.  Performed at [facility], [place] [address] [place], [place] [address]   Aerobic/Anaerobic Culture w Gram Stain (surgical/deep wound)     Status: None (Preliminary result)   Collection Time: 10/28/20 10:03 AM   Specimen: Foot  Result Value Ref Range Status   Specimen Description   Final    FOOT Performed at [facility] [address], [place], [place] [address]    Special Requests   Final    NONE Performed at [facility], [place] [address] [place], [place] [address]    Gram Stain   Final    NO WBC SEEN FEW GRAM POSITIVE COCCI Performed at [facility], [place] [address], [place], [place] [address]    Culture   Final    ABUNDANT GROUP A STREP (S.PYOGENES) ISOLATED FEW KLEBSIELLA OXYTOCA    Report Status PENDING  Incomplete   Organism ID, Bacteria KLEBSIELLA OXYTOCA  Final      Susceptibility   Klebsiella oxytoca - MIC*    AMPICILLIN >=32 RESISTANT Resistant     CEFAZOLIN 8 SENSITIVE Sensitive     CEFEPIME <=0.12 SENSITIVE Sensitive     CEFTAZIDIME <=1 SENSITIVE Sensitive     CEFTRIAXONE <=0.25 SENSITIVE Sensitive     CIPROFLOXACIN <=0.25 SENSITIVE Sensitive     GENTAMICIN <=1 SENSITIVE Sensitive     IMIPENEM <=0.25 SENSITIVE Sensitive     TRIMETH/SULFA <=20 SENSITIVE Sensitive     AMPICILLIN/SULBACTAM 16 INTERMEDIATE Intermediate     PIP/TAZO <=4 SENSITIVE Sensitive      * FEW KLEBSIELLA OXYTOCA         Radiology Studies: MR FOOT LEFT WO CONTRAST  Result Date: 10/28/2020 CLINICAL DATA:  Diabetic patient with a skin wound on the left foot with redness, pain and swelling. EXAM: MRI OF THE LEFT FOOT WITHOUT CONTRAST TECHNIQUE: Multiplanar, multisequence MR imaging of the left foot was performed. No intravenous contrast was administered. COMPARISON:  None. FINDINGS: Bones/Joint/Cartilage No marrow edema to suggest osteomyelitis is identified. No fracture or dislocation. Small first MTP joint effusion is noted. Ligaments Intact. Muscles and Tendons No intramuscular fluid collection. Soft tissues There is subcutaneous edema about the foot which is worst on the dorsal side and about the great toe. A superficial fluid collection surrounding the lateral aspect of the great toe is likely a blister. Plantar skin wound is seen deep to the base of the proximal phalanx of the great toe. There is a skin wound in the dorsal soft tissues at the level of the mid aspect of the great toe. No underlying abscess is identified. A small volume of fluid is seen in the first intermetatarsal bursa. IMPRESSION: Skin wounds on the foot without evidence of abscess, osteomyelitis or septic joint. Skin wounds about the great toe. Intense subcutaneous edema about the foot is worst at the great toe and consistent with cellulitis. No abscess is identified. Cutaneous T2 hyperintensity about the lateral aspect of the great toe is likely a blister. Small volume of fluid in the first intermetatarsal bursa consistent with bursitis which could be septic or aseptic. Electronically Signed   By: Inge Rise M.D.   On: 10/28/2020 15:45        Scheduled Meds: . aspirin EC  81 mg Oral Daily  .  atorvastatin  40 mg Oral Daily  . DULoxetine  60 mg Oral Daily  . enoxaparin (LOVENOX) injection  40 mg Subcutaneous Q24H  . insulin aspart  0-15 Units Subcutaneous TID WC  . insulin aspart  3 Units  Subcutaneous TID WC  . insulin glargine  10 Units Subcutaneous Daily  . mirtazapine  15 mg Oral QHS  . nicotine  21 mg Transdermal Daily  . pantoprazole  40 mg Oral Daily  . sodium chloride flush  3 mL Intravenous Q12H   Continuous Infusions: . sodium chloride 150 mL/hr at 10/30/20 1000  . sodium chloride Stopped (10/30/20 0904)  . cefTRIAXone (ROCEPHIN)  IV Stopped (10/30/20 0945)  . metronidazole 500 mg (10/30/20 0505)     LOS: 2 days   Georgette Shell, MD  10/30/2020, 12:52 PM

## 2020-10-31 LAB — COMPREHENSIVE METABOLIC PANEL
ALT: 15 U/L (ref 0–44)
AST: 16 U/L (ref 15–41)
Albumin: 2.7 g/dL — ABNORMAL LOW (ref 3.5–5.0)
Alkaline Phosphatase: 91 U/L (ref 38–126)
Anion gap: 12 (ref 5–15)
BUN: 7 mg/dL (ref 6–20)
CO2: 20 mmol/L — ABNORMAL LOW (ref 22–32)
Calcium: 8.2 mg/dL — ABNORMAL LOW (ref 8.9–10.3)
Chloride: 101 mmol/L (ref 98–111)
Creatinine, Ser: 0.51 mg/dL — ABNORMAL LOW (ref 0.61–1.24)
GFR, Estimated: >60 mL/min (ref >60)
Glucose, Bld: 205 mg/dL — ABNORMAL HIGH (ref 70–99)
Potassium: 3.3 mmol/L — ABNORMAL LOW (ref 3.5–5.1)
Sodium: 133 mmol/L — ABNORMAL LOW (ref 135–145)
Total Bilirubin: 0.6 mg/dL (ref 0.3–1.2)
Total Protein: 6.6 g/dL (ref 6.5–8.1)

## 2020-10-31 LAB — GLUCOSE, CAPILLARY
Glucose-Capillary: 186 mg/dL — ABNORMAL HIGH (ref 70–99)
Glucose-Capillary: 238 mg/dL — ABNORMAL HIGH (ref 70–99)
Glucose-Capillary: 237 mg/dL — ABNORMAL HIGH (ref 70–99)
Glucose-Capillary: 226 mg/dL — ABNORMAL HIGH (ref 70–99)

## 2020-10-31 LAB — CBC
HCT: 38.2 % — ABNORMAL LOW (ref 39.0–52.0)
Hemoglobin: 12.8 g/dL — ABNORMAL LOW (ref 13.0–17.0)
MCH: 29.9 pg (ref 26.0–34.0)
MCHC: 33.5 g/dL (ref 30.0–36.0)
MCV: 89.3 fL (ref 80.0–100.0)
Platelets: 211 10*3/uL (ref 150–400)
RBC: 4.28 MIL/uL (ref 4.22–5.81)
RDW: 12.9 % (ref 11.5–15.5)
WBC: 14.1 10*3/uL — ABNORMAL HIGH (ref 4.0–10.5)
nRBC: 0 % (ref 0.0–0.2)

## 2020-10-31 LAB — CULTURE, BLOOD (ROUTINE X 2): Special Requests: ADEQUATE

## 2020-10-31 LAB — C-REACTIVE PROTEIN: CRP: 13 mg/dL — ABNORMAL HIGH (ref <1.0)

## 2020-10-31 LAB — SEDIMENTATION RATE: Sed Rate: 74 mm/hr — ABNORMAL HIGH (ref 0–16)

## 2020-10-31 MED ORDER — POTASSIUM CHLORIDE CRYS ER 20 MEQ PO TBCR
40.0000 meq | EXTENDED_RELEASE_TABLET | Freq: Once | ORAL | Status: AC
  Administered 2020-10-31: 40 meq via ORAL
  Filled 2020-10-31: qty 2

## 2020-10-31 MED ORDER — GLIPIZIDE 5 MG PO TABS
5.0000 mg | ORAL_TABLET | Freq: Two times a day (BID) | ORAL | Status: DC
  Administered 2020-10-31 – 2020-11-01 (×2): 5 mg via ORAL
  Filled 2020-10-31 (×2): qty 1

## 2020-10-31 MED ORDER — LOPERAMIDE HCL 2 MG PO CAPS
2.0000 mg | ORAL_CAPSULE | Freq: Four times a day (QID) | ORAL | Status: DC | PRN
  Administered 2020-10-31 – 2020-11-01 (×2): 2 mg via ORAL
  Filled 2020-10-31 (×3): qty 1

## 2020-10-31 NOTE — Progress Notes (Addendum)
PROGRESS NOTE    [name] [name]  KGM:[id number] DOB: 06/17/1961 DOA: 10/28/2020 PCP: [name], NP    Brief Narrative: HPI per Dr. Neysa Bonito on 10/28/2020. 60 year old male with past medical history of HCV, GERD, Barrett's esophagus, diabetes, depression who presented to the ED today from an urgent care with complaints of left great toe pain since Friday. He was working in the woods with his friend over the past week and felt like his foot was rubbing against his shoe the whole time. Noted a blister on his toe 2 days ago and this morning the blister had ruptured.  Subjective fevers this morning.  He has been taking leftover doxycycline from a prior infection since last night for this.  He also recently had right hand cellulitis with abscesses and was prescribed clindamycin in January at an urgent care for this and they are currently healing.  Regarding his diabetes, states that his sugar is frequently 400+ but does not check frequently.    ED Course: Afebrile, tachycardic, tachypneic,  hypotensive (84/54) and resolved with fluids, on room air. Notable Labs: Sodium 128, K3.1, chloride 91, glucose 449, BUN 21, creatinine 1.21, WBC 21.1, Hb 14.4, INR 1.3, lactic acid 1.9, COVID-19 pending. Notable Imaging: CXR unremarkable.  Left foot XR-soft tissue swelling of the great toe and area of the first MTP joint without evidence of osteomyelitis, arthropathy or other acute findings. Patient received 2 L LR bolus, Zosyn, vancomycin.   Assessment & Plan:   Principal Problem:   Sepsis ([place]) Active Problems:   Tobacco use disorder   Type 2 diabetes mellitus with hyperlipidemia (HCC)   Essential hypertension   Cellulitis in diabetic foot (HCC)   #1 sepsis -secondary to left foot cellulitis/diabetic foot present on admission-patient met sepsis criteria on admission with tachypnea respiratory rate 35, fever of 101.7 heart rate above 120 tachycardia and leukocytosis White count 21.1, lactic acid 2.5, with  endorgan damage such as AKI creatinine on presentation was 1.21 with his baseline creatinine of 0.75. He was started on IV fluids per sepsis protocol with improvement in his blood pressure. He was being treated with Vanco cefepime and Flagyl. Wound culture growing Klebsiella oxytoca sensitive to Rocephin Blood culture growing group A strep sensitivity pending Antibiotics changed to Rocephin and Flagyl 10/30/2020  echocardiogram no endocarditis Will change to po antibiotics tomorrow. MRI of the left foot did not reveal evidence of osteomyelitis but showed severe cellulitis. Follow-up cultures. Today he still remains  tachycardic at 112, afebrile. Foot still remains edematous warm to touch with erythema. Still with leukocytosis though improving.  White count 14.1 from 16.7  from 21.  Wound consult noted-Measurement: 2 cm x 6 cm circumferential lesion to left great toe, ruptured blood filled blister.  LEft plantar foot at great toe 2 cm x 2.5 cm with scabbing to wound bed.  Wound UYQ:[id number] tissue.   #2 uncontrolled type 2 diabetes with hyperglycemia with an A1c of 11.2 with a blood sugar of 449.  Patient reports his blood sugar usually runs in the four hundreds at home.  CBG (last 3)  Recent Labs    10/30/20 2032 10/31/20 0730 10/31/20 1119  GLUCAP 276* 186* 238*   Restart glipizide  #3 history of hypertension patient is on amlodipine and losartan at home which has been on hold due to hypotension which is thought to be due to sepsis. Blood pressure is still soft at 128/75.  #4 history of hyperlipidemia on statin  #5 history of tobacco use he  asked for the nicotine patch today will order for today.  #6 history of Barrett's esophagus continue PPI.  #7 hyponatremia -sodium 133 improving   #8 hypokalemia replete and recheck labs check magnesium.k 3.3   Estimated body mass index is 23.43 kg/m as calculated from the following:   Height as of this encounter: '5\' 11"'  (1.803 m).    Weight as of this encounter: 76.2 kg.  DVT prophylaxis: Lovenox Code Status: DNR Family Communication none at bedside  disposition Plan:  Status is: Inpatient  Dispo: The patient is from: Home              Anticipated d/c is to: Home              Patient currently is not medically stable to d/c.   Difficult to place patient-na   Consultants: None  Procedures: None Antimicrobials: Vancomycin and cefepime  Subjective: Resting in bed dressings on the foot saturated with sero sanguinous   Objective: Vitals:   10/30/20 0530 10/30/20 1319 10/30/20 2034 10/31/20 0440  BP: 128/75 118/66 124/72 136/74  Pulse: 88 88 81 79  Resp:  (!) '22 20 20  ' Temp: 98.9 F (37.2 C) 98.9 F (37.2 C) 98.9 F (37.2 C) 99 F (37.2 C)  TempSrc: Oral Oral Oral Oral  SpO2: 98% 94% 96% 94%  Weight:      Height:        Intake/Output Summary (Last 24 hours) at 10/31/2020 1237 Last data filed at 10/31/2020 0030 Gross per 24 hour  Intake 829.92 ml  Output 1600 ml  Net -770.08 ml   Filed Weights   10/28/20 0925  Weight: 76.2 kg    Examination:  General exam: Appears calm and comfortable  Respiratory system: Clear to auscultation. Respiratory effort normal. Cardiovascular system: S1 & S2 heard, RRR. No JVD, murmurs, rubs, gallops or clicks. No pedal edema. Gastrointestinal system: Abdomen is nondistended, soft and nontender. No organomegaly or masses felt. Normal bowel sounds heard. Central nervous system: Alert and oriented. No focal neurological deficits. Extremities: Left foot covered in dressing.soiled dressings    Skin: Left foot ulcer  psychiatry: Judgement and insight appear normal. Mood & affect appropriate.     Data Reviewed: I have personally reviewed following labs and imaging studies  CBC: Recent Labs  Lab 10/28/20 1014 10/29/20 1140 10/30/20 0354 10/31/20 0411  WBC 21.1* 17.3* 16.2* 14.1*  NEUTROABS 19.8*  --   --   --   HGB 14.4 12.1* 11.4* 12.8*  HCT 43.2 35.7* 34.7*  38.2*  MCV 90.4 89.9 90.4 89.3  PLT 202 169 179 701   Basic Metabolic Panel: Recent Labs  Lab 10/28/20 1014 10/29/20 1140 10/30/20 0354 10/30/20 1431 10/31/20 0411  NA 128* 132* 130* 131* 133*  K 3.1* 2.9* 2.5* 3.0* 3.3*  CL 91* 102 101 101 101  CO2 25 22 18* 22 20*  GLUCOSE 449* 184* 140* 217* 205*  BUN 21* '18 11 9 7  ' CREATININE 1.21 0.68 0.52* 0.45* 0.51*  CALCIUM 8.6* 7.8* 7.6* 7.8* 8.2*  MG  --   --   --  2.0  --    GFR: Estimated Creatinine Clearance: 107.2 mL/min (A) (by C-G formula based on SCr of 0.51 mg/dL (L)). Liver Function Tests: Recent Labs  Lab 10/28/20 1014 10/30/20 0354 10/31/20 0411  AST '20 21 16  ' ALT '20 18 15  ' ALKPHOS 91 81 91  BILITOT 0.8 1.3* 0.6  PROT 8.1 5.9* 6.6  ALBUMIN 3.6 2.6* 2.7*  No results for input(s): LIPASE, AMYLASE in the last 168 hours. No results for input(s): AMMONIA in the last 168 hours. Coagulation Profile: Recent Labs  Lab 10/28/20 1014  INR 1.3*   Cardiac Enzymes: No results for input(s): CKTOTAL, CKMB, CKMBINDEX, TROPONINI in the last 168 hours. BNP (last 3 results) No results for input(s): PROBNP in the last 8760 hours. HbA1C: No results for input(s): HGBA1C in the last 72 hours. CBG: Recent Labs  Lab 10/30/20 1116 10/30/20 1641 10/30/20 2032 10/31/20 0730 10/31/20 1119  GLUCAP 253* 167* 276* 186* 238*   Lipid Profile: No results for input(s): CHOL, HDL, LDLCALC, TRIG, CHOLHDL, LDLDIRECT in the last 72 hours. Thyroid Function Tests: No results for input(s): TSH, T4TOTAL, FREET4, T3FREE, THYROIDAB in the last 72 hours. Anemia Panel: No results for input(s): VITAMINB12, FOLATE, FERRITIN, TIBC, IRON, RETICCTPCT in the last 72 hours. Sepsis Labs: Recent Labs  Lab 10/28/20 0930 10/28/20 1336 10/28/20 1602 10/28/20 1901  LATICACIDVEN 1.9 2.5* 1.7 1.0    Recent Results (from the past 240 hour(s))  Culture, blood (Routine x 2)     Status: None (Preliminary result)   Collection Time: 10/28/20  9:30 AM    Specimen: BLOOD  Result Value Ref Range Status   Specimen Description   Final    BLOOD RIGHT ANTECUBITAL Performed at [facility] [address], [place], [place] [address]    Special Requests   Final    BOTTLES DRAWN AEROBIC AND ANAEROBIC Blood Culture adequate volume Performed at [facility] [address], [place], [name] [address]    Culture   Final    NO GROWTH 2 DAYS Performed at [facility] [address], [place], [place] [address]    Report Status PENDING  Incomplete  Culture, blood (Routine x 2)     Status: Abnormal   Collection Time: 10/28/20  9:35 AM   Specimen: BLOOD  Result Value Ref Range Status   Specimen Description   Final    BLOOD LEFT ANTECUBITAL Performed at [facility], [place] [address], [place], [place] [address]    Special Requests   Final    BOTTLES DRAWN AEROBIC AND ANAEROBIC Blood Culture adequate volume Performed at [facility] [address], [place], [place] [address]    Culture  Setup Time   Final    GRAM POSITIVE COCCI IN CHAINS ANAEROBIC BOTTLE ONLY CRITICAL RESULT CALLED TO, READ BACK BY AND VERIFIED WITH: PHARMD DREW W. 2031 [id number] FCP    Culture (A)  Final    GROUP A STREP (S.PYOGENES) ISOLATED HEALTH DEPARTMENT NOTIFIED Performed at [facility], [place] [address], [place], [place] [address]    Report Status 10/31/2020 FINAL  Final   Organism ID, Bacteria GROUP A STREP (S.PYOGENES) ISOLATED  Final      Susceptibility   Group a strep (s.pyogenes) isolated - MIC*    PENICILLIN <=0.06 SENSITIVE Sensitive     CEFTRIAXONE <=0.12 SENSITIVE Sensitive     ERYTHROMYCIN >=8 RESISTANT Resistant     LEVOFLOXACIN 0.5 SENSITIVE Sensitive     VANCOMYCIN <=0.12 SENSITIVE Sensitive     * GROUP A STREP (S.PYOGENES) ISOLATED  Blood Culture ID Panel (Reflexed)     Status: Abnormal   Collection Time: 10/28/20  9:35 AM  Result Value Ref Range Status   Enterococcus  faecalis NOT DETECTED NOT DETECTED Final   Enterococcus Faecium NOT DETECTED NOT DETECTED Final   Listeria monocytogenes NOT DETECTED NOT  DETECTED Final   Staphylococcus species NOT DETECTED NOT DETECTED Final   Staphylococcus aureus (BCID) NOT DETECTED NOT DETECTED Final   Staphylococcus epidermidis NOT DETECTED NOT DETECTED Final   Staphylococcus lugdunensis NOT DETECTED NOT DETECTED Final   Streptococcus species DETECTED (A) NOT DETECTED Final    Comment: CRITICAL RESULT CALLED TO, READ BACK BY AND VERIFIED WITH: PHARMD DREW W. 2031 [id number] FCP    Streptococcus agalactiae NOT DETECTED NOT DETECTED Final   Streptococcus pneumoniae NOT DETECTED NOT DETECTED Final   Streptococcus pyogenes DETECTED (A) NOT DETECTED Final    Comment: CRITICAL RESULT CALLED TO, READ BACK BY AND VERIFIED WITH: PHARMD DREW W. 2031 [id number] FCP    A.calcoaceticus-baumannii NOT DETECTED NOT DETECTED Final   Bacteroides fragilis NOT DETECTED NOT DETECTED Final   Enterobacterales NOT DETECTED NOT DETECTED Final   Enterobacter cloacae complex NOT DETECTED NOT DETECTED Final   Escherichia coli NOT DETECTED NOT DETECTED Final   Klebsiella aerogenes NOT DETECTED NOT DETECTED Final   Klebsiella oxytoca NOT DETECTED NOT DETECTED Final   Klebsiella pneumoniae NOT DETECTED NOT DETECTED Final   Proteus species NOT DETECTED NOT DETECTED Final   Salmonella species NOT DETECTED NOT DETECTED Final   Serratia marcescens NOT DETECTED NOT DETECTED Final   Haemophilus influenzae NOT DETECTED NOT DETECTED Final   Neisseria meningitidis NOT DETECTED NOT DETECTED Final   Pseudomonas aeruginosa NOT DETECTED NOT DETECTED Final   Stenotrophomonas maltophilia NOT DETECTED NOT DETECTED Final   Candida albicans NOT DETECTED NOT DETECTED Final   Candida auris NOT DETECTED NOT DETECTED Final   Candida glabrata NOT DETECTED NOT DETECTED Final   Candida krusei NOT DETECTED NOT DETECTED Final   Candida parapsilosis NOT DETECTED NOT  DETECTED Final   Candida tropicalis NOT DETECTED NOT DETECTED Final   Cryptococcus neoformans/gattii NOT DETECTED NOT DETECTED Final    Comment: Performed at [facility], [address] [place], [place] [address]  Resp Panel by RT-PCR (Flu A&B, Covid) Nasopharyngeal Swab     Status: None   Collection Time: 10/28/20  9:58 AM   Specimen: Nasopharyngeal Swab; Nasopharyngeal(NP) swabs in vial transport medium  Result Value Ref Range Status   SARS Coronavirus 2 by RT PCR NEGATIVE NEGATIVE Final    Comment: (NOTE) SARS-CoV-2 target nucleic acids are NOT DETECTED.  The SARS-CoV-2 RNA is generally detectable in upper respiratory specimens during the acute phase of infection. The lowest concentration of SARS-CoV-2 viral copies this assay can detect is 138 copies/mL. A negative result does not preclude SARS-Cov-2 infection and should not be used as the sole basis for treatment or other patient management decisions. A negative result may occur with  improper specimen collection/handling, submission of specimen other than nasopharyngeal swab, presence of viral mutation(s) within the areas targeted by this assay, and inadequate number of viral copies(<138 copies/mL). A negative result must be combined with clinical observations, patient history, and epidemiological information. The expected result is Negative.  Fact Sheet for Patients:  EntrepreneurPulse.com.au  Fact Sheet for Healthcare Providers:  IncredibleEmployment.be  This test is no t yet approved or cleared by the [place] FDA and  has been authorized for detection and/or diagnosis of SARS-CoV-2 by FDA under an Emergency Use Authorization (EUA). This EUA will remain  in effect (meaning this test can be used) for the duration of the COVID-19 declaration under Section 564(b)(1) of the Act, 21 U.S.C.section 360bbb-3(b)(1), unless the authorization is terminated  or revoked sooner.        Influenza  A by PCR NEGATIVE NEGATIVE Final   Influenza B by PCR NEGATIVE NEGATIVE Final    Comment: (NOTE) The Xpert Xpress SARS-CoV-2/FLU/RSV plus assay is intended as an aid in the diagnosis of influenza from Nasopharyngeal swab specimens and should not be used as a sole basis for treatment. Nasal washings and aspirates are unacceptable for Xpert Xpress SARS-CoV-2/FLU/RSV testing.  Fact Sheet for Patients: EntrepreneurPulse.com.au  Fact Sheet for Healthcare Providers: IncredibleEmployment.be  This test is not yet approved or cleared by the [place] FDA and has been authorized for detection and/or diagnosis of SARS-CoV-2 by FDA under an Emergency Use Authorization (EUA). This EUA will remain in effect (meaning this test can be used) for the duration of the COVID-19 declaration under Section 564(b)(1) of the Act, 21 U.S.C. section 360bbb-3(b)(1), unless the authorization is terminated or revoked.  Performed at [facility], [place] [address] [place], [place] [address]   Aerobic/Anaerobic Culture w Gram Stain (surgical/deep wound)     Status: None (Preliminary result)   Collection Time: 10/28/20 10:03 AM   Specimen: Foot  Result Value Ref Range Status   Specimen Description   Final    FOOT Performed at [facility] [address] [place], [place] [address]    Special Requests   Final    NONE Performed at [facility], [place] [address], [place], [place] [address]    Gram Stain   Final    NO WBC SEEN FEW GRAM POSITIVE COCCI Performed at [facility], [place] [address] [place],  [address]    Culture   Final    ABUNDANT GROUP A STREP (S.PYOGENES) ISOLATED FEW KLEBSIELLA OXYTOCA NO ANAEROBES ISOLATED; CULTURE IN PROGRESS FOR 5 DAYS    Report Status PENDING  Incomplete   Organism ID, Bacteria KLEBSIELLA OXYTOCA  Final      Susceptibility   Klebsiella oxytoca - MIC*     AMPICILLIN >=32 RESISTANT Resistant     CEFAZOLIN 8 SENSITIVE Sensitive     CEFEPIME <=0.12 SENSITIVE Sensitive     CEFTAZIDIME <=1 SENSITIVE Sensitive     CEFTRIAXONE <=0.25 SENSITIVE Sensitive     CIPROFLOXACIN <=0.25 SENSITIVE Sensitive     GENTAMICIN <=1 SENSITIVE Sensitive     IMIPENEM <=0.25 SENSITIVE Sensitive     TRIMETH/SULFA <=20 SENSITIVE Sensitive     AMPICILLIN/SULBACTAM 16 INTERMEDIATE Intermediate     PIP/TAZO <=4 SENSITIVE Sensitive     * FEW KLEBSIELLA OXYTOCA         Radiology Studies: ECHOCARDIOGRAM COMPLETE  Result Date: 10/30/2020    ECHOCARDIOGRAM REPORT   Patient Name:   BERNICE MULLIN Date of Exam: 10/30/2020 Medical Rec #:  [id number]       Height:       71.0 in Accession #:    [id number]      Weight:       168.0 lb Date of Birth:  Sep 10, 1960       BSA:          1.958 m Patient Age:    21 years        BP:           118/66 mmHg Patient Gender: M               HR:           88 bpm. Exam Location:  Inpatient Procedure: 2D Echo, Cardiac Doppler and Color Doppler Indications:    Bacteremia  History:  Patient has no prior history of Echocardiogram examinations.                 Signs/Symptoms:Fever and Sepsis; Risk Factors:Diabetes,                 Hypertension, Current Smoker and Hep.C.  Sonographer:    Dustin Flock Referring Phys: [id number] [place]  1. Left ventricular ejection fraction, by estimation, is 60 to 65%. The left ventricle has normal function. The left ventricle has no regional wall motion abnormalities. There is mild left ventricular hypertrophy. Left ventricular diastolic parameters were normal.  2. Right ventricular systolic function is normal. The right ventricular size is normal. Tricuspid regurgitation signal is inadequate for assessing PA pressure.  3. The mitral valve is normal in structure. Trivial mitral valve regurgitation. No evidence of mitral stenosis.  4. The aortic valve is tricuspid. Aortic valve regurgitation is not  visualized. No aortic stenosis is present.  5. The inferior vena cava is dilated in size with <50% respiratory variability, suggesting right atrial pressure of 15 mmHg.  6. No evidence for endocarditis was seen. FINDINGS  Left Ventricle: Left ventricular ejection fraction, by estimation, is 60 to 65%. The left ventricle has normal function. The left ventricle has no regional wall motion abnormalities. The left ventricular internal cavity size was normal in size. There is  mild left ventricular hypertrophy. Left ventricular diastolic parameters were normal. Right Ventricle: The right ventricular size is normal. No increase in right ventricular wall thickness. Right ventricular systolic function is normal. Tricuspid regurgitation signal is inadequate for assessing PA pressure. Left Atrium: Left atrial size was normal in size. Right Atrium: Right atrial size was normal in size. Pericardium: There is no evidence of pericardial effusion. Mitral Valve: The mitral valve is normal in structure. Trivial mitral valve regurgitation. No evidence of mitral valve stenosis. Tricuspid Valve: The tricuspid valve is normal in structure. Tricuspid valve regurgitation is trivial. Aortic Valve: The aortic valve is tricuspid. Aortic valve regurgitation is not visualized. No aortic stenosis is present. Pulmonic Valve: The pulmonic valve was normal in structure. Pulmonic valve regurgitation is not visualized. Aorta: The aortic root is normal in size and structure. Venous: The inferior vena cava is dilated in size with less than 50% respiratory variability, suggesting right atrial pressure of 15 mmHg. IAS/Shunts: No atrial level shunt detected by color flow Doppler.  LEFT VENTRICLE PLAX 2D LVIDd:         4.80 cm  Diastology LVIDs:         2.80 cm  LV e' medial:    8.49 cm/s LV PW:         1.40 cm  LV E/e' medial:  13.3 LV IVS:        1.50 cm  LV e' lateral:   9.25 cm/s LVOT diam:     2.40 cm  LV E/e' lateral: 12.2 LV SV:         90 LV SV  Index:   46 LVOT Area:     4.52 cm  RIGHT VENTRICLE RV Basal diam:  2.70 cm RV S prime:     12.90 cm/s TAPSE (M-mode): 2.5 cm LEFT ATRIUM             Index       RIGHT ATRIUM           Index LA diam:        4.00 cm 2.04 cm/m  RA Area:     14.00 cm  LA Vol (A2C):   49.6 ml 25.33 ml/m RA Volume:   32.40 ml  16.55 ml/m LA Vol (A4C):   39.0 ml 19.92 ml/m LA Biplane Vol: 44.0 ml 22.47 ml/m  AORTIC VALVE LVOT Vmax:   98.30 cm/s LVOT Vmean:  68.200 cm/s LVOT VTI:    0.198 m  AORTA Ao Root diam: 3.60 cm MITRAL VALVE MV Area (PHT): 5.66 cm     SHUNTS MV Decel Time: 134 msec     Systemic VTI:  0.20 m MV E velocity: 113.00 cm/s  Systemic Diam: 2.40 cm MV A velocity: 61.60 cm/s MV E/A ratio:  1.83 Loralie Champagne MD Electronically signed by Loralie Champagne MD Signature Date/Time: 10/30/2020/5:06:34 PM    Final         Scheduled Meds: . aspirin EC  81 mg Oral Daily  . atorvastatin  40 mg Oral Daily  . DULoxetine  60 mg Oral Daily  . enoxaparin (LOVENOX) injection  40 mg Subcutaneous Q24H  . insulin aspart  0-15 Units Subcutaneous TID WC  . insulin aspart  3 Units Subcutaneous TID WC  . insulin glargine  10 Units Subcutaneous Daily  . mirtazapine  15 mg Oral QHS  . nicotine  21 mg Transdermal Daily  . pantoprazole  40 mg Oral Daily  . sodium chloride flush  3 mL Intravenous Q12H   Continuous Infusions: . sodium chloride Stopped (10/30/20 0904)  . cefTRIAXone (ROCEPHIN)  IV 2 g (10/30/20 2210)  . metronidazole 500 mg (10/31/20 0529)     LOS: 3 days   Georgette Shell, MD  10/31/2020, 12:37 PM

## 2020-10-31 NOTE — Progress Notes (Signed)
Inpatient Diabetes Program Recommendations  AACE/ADA: New Consensus Statement on Inpatient Glycemic Control (2015)  Target Ranges:  Prepandial:   less than 140 mg/dL      Peak postprandial:   less than 180 mg/dL (1-2 hours)      Critically ill patients:  140 - 180 mg/dL   Lab Results  Component Value Date   GLUCAP 238 (H) 10/31/2020   HGBA1C 11.2 (H) 10/28/2020    Review of Glycemic Control  Diabetes history: DM2 Outpatient Diabetes medications: Invokana 300 mg QD, glipizide 5 mg QAM Current orders for Inpatient glycemic control: Lantus 10 QD, Novolog 0-15 units TID with meals + 3 units TID with meals, glipizide 5 mg BID  HgbA1C - 11.2% PCP - [facility] Glucose 205, CBGs 186, 238 mg/dL  Inpatient Diabetes Program Recommendations:     Increase Novolog to 4 units TID with meals Increase Lantus to 12 units QD if FBS > 180 mg/dL.  Spoke with pt at bedside regarding his diabetes control and HgbA1C of 11.2% (average blood sugar of 275 mg/dL). Pt states he still drinks regular Cokes and Tang. Unwilling to drink diet soda. Discussed other sugar-free beverages. Pt has meter and supplies at home although rarely checks his blood sugars. States HgbA1C has been as high as 14%. Has been trying to exercise more. Discussed impact of nutrition, exercise, stress, sickness, and medications on diabetes control. Stressed importance of lowering blood sugars to reduce risk of complications from diabetes. Explained that foot will not heal as quickly with high blood sugars. Pt open and honest about not wanting to go on insulin. Will think about it and speak with his PCP at next visit.   Continue to follow while inpatient.  Thank you. Lorenda Peck, RD, LDN, CDE Inpatient Diabetes Coordinator [phone]

## 2020-11-01 ENCOUNTER — Other Ambulatory Visit (HOSPITAL_COMMUNITY): Payer: Self-pay | Admitting: Internal Medicine

## 2020-11-01 LAB — CBC
HCT: 37 % — ABNORMAL LOW (ref 39.0–52.0)
Hemoglobin: 12.6 g/dL — ABNORMAL LOW (ref 13.0–17.0)
MCH: 30.1 pg (ref 26.0–34.0)
MCHC: 34.1 g/dL (ref 30.0–36.0)
MCV: 88.5 fL (ref 80.0–100.0)
Platelets: 217 10*3/uL (ref 150–400)
RBC: 4.18 MIL/uL — ABNORMAL LOW (ref 4.22–5.81)
RDW: 12.8 % (ref 11.5–15.5)
WBC: 13.2 10*3/uL — ABNORMAL HIGH (ref 4.0–10.5)
nRBC: 0 % (ref 0.0–0.2)

## 2020-11-01 LAB — COMPREHENSIVE METABOLIC PANEL
ALT: 14 U/L (ref 0–44)
AST: 14 U/L — ABNORMAL LOW (ref 15–41)
Albumin: 2.8 g/dL — ABNORMAL LOW (ref 3.5–5.0)
Alkaline Phosphatase: 82 U/L (ref 38–126)
Anion gap: 9 (ref 5–15)
BUN: 8 mg/dL (ref 6–20)
CO2: 23 mmol/L (ref 22–32)
Calcium: 8.1 mg/dL — ABNORMAL LOW (ref 8.9–10.3)
Chloride: 98 mmol/L (ref 98–111)
Creatinine, Ser: 0.42 mg/dL — ABNORMAL LOW (ref 0.61–1.24)
GFR, Estimated: >60 mL/min (ref >60)
Glucose, Bld: 257 mg/dL — ABNORMAL HIGH (ref 70–99)
Potassium: 3.3 mmol/L — ABNORMAL LOW (ref 3.5–5.1)
Sodium: 130 mmol/L — ABNORMAL LOW (ref 135–145)
Total Bilirubin: 0.7 mg/dL (ref 0.3–1.2)
Total Protein: 6.6 g/dL (ref 6.5–8.1)

## 2020-11-01 LAB — C-REACTIVE PROTEIN: CRP: 7.7 mg/dL — ABNORMAL HIGH (ref <1.0)

## 2020-11-01 LAB — GLUCOSE, CAPILLARY
Glucose-Capillary: 251 mg/dL — ABNORMAL HIGH (ref 70–99)
Glucose-Capillary: 276 mg/dL — ABNORMAL HIGH (ref 70–99)

## 2020-11-01 LAB — SEDIMENTATION RATE: Sed Rate: 85 mm/hr — ABNORMAL HIGH (ref 0–16)

## 2020-11-01 MED ORDER — SULFAMETHOXAZOLE-TRIMETHOPRIM 800-160 MG PO TABS
1.0000 | ORAL_TABLET | Freq: Two times a day (BID) | ORAL | 0 refills | Status: DC
Start: 2020-11-01 — End: 2020-11-26

## 2020-11-01 MED ORDER — NICOTINE 21 MG/24HR TD PT24: 21.0000 mg | MEDICATED_PATCH | Freq: Every day | TRANSDERMAL | 0 refills | Status: DC

## 2020-11-01 MED ORDER — NOVOLOG 70/30 FLEXPEN RELION (70-30) 100 UNIT/ML ~~LOC~~ SUPN
20.0000 [IU] | PEN_INJECTOR | Freq: Two times a day (BID) | SUBCUTANEOUS | 11 refills | Status: DC
Start: 2020-11-01 — End: 2021-02-26

## 2020-11-01 MED ORDER — LOSARTAN POTASSIUM 100 MG PO TABS: 50.0000 mg | ORAL_TABLET | Freq: Every day | ORAL | 1 refills | Status: DC

## 2020-11-01 MED ORDER — AMOXICILLIN-POT CLAVULANATE 875-125 MG PO TABS: 1.0000 | ORAL_TABLET | Freq: Two times a day (BID) | ORAL | 0 refills | Status: DC

## 2020-11-01 MED ORDER — INSULIN GLARGINE 100 UNIT/ML ~~LOC~~ SOLN: 16.0000 [IU] | Freq: Every day | SUBCUTANEOUS | 11 refills | Status: DC

## 2020-11-01 MED ORDER — LOSARTAN POTASSIUM 100 MG PO TABS: 100.0000 mg | ORAL_TABLET | Freq: Every day | ORAL | 1 refills | Status: DC

## 2020-11-01 MED ORDER — INSULIN ASPART 100 UNIT/ML ~~LOC~~ SOLN: 3.0000 [IU] | Freq: Three times a day (TID) | SUBCUTANEOUS | 11 refills | Status: DC

## 2020-11-01 MED ORDER — INSULIN STARTER KIT- PEN NEEDLES (ENGLISH)
1.0000 | Freq: Once | Status: AC
  Administered 2020-11-01: 1
  Filled 2020-11-01: qty 1

## 2020-11-01 MED ORDER — LIVING WELL WITH DIABETES BOOK
Freq: Once | Status: AC
  Filled 2020-11-01: qty 1

## 2020-11-01 MED ORDER — POTASSIUM CHLORIDE CRYS ER 20 MEQ PO TBCR
40.0000 meq | EXTENDED_RELEASE_TABLET | Freq: Once | ORAL | Status: AC
  Administered 2020-11-01: 40 meq via ORAL
  Filled 2020-11-01: qty 2

## 2020-11-01 MED FILL — NICOTINE 21 MG/24HR PATCH: 21 | 28 days supply | Qty: 28 | Fill #0

## 2020-11-01 MED FILL — NOVOLOG MIX 70-30 FLEXPEN S: (70-30) 100 | 30 days supply | Qty: 12 | Fill #0

## 2020-11-01 MED FILL — LANTUS 100 UNITS/ML VIAL: 100 | 28 days supply | Qty: 10 | Fill #0

## 2020-11-01 MED FILL — AMOX-CLAV 875-125 MG TABLET: 875-125 | 14 days supply | Qty: 28 | Fill #0

## 2020-11-01 MED FILL — SULFAMETHOXAZOLE-TMP DS TAB: 800-160 | 14 days supply | Qty: 28 | Fill #0

## 2020-11-01 NOTE — Discharge Instructions (Signed)
Insulin Injection Instructions, Single Insulin Dose, Adult There are many different types of insulin. The type of insulin that you take may determine how many injections you give yourself and when you need to give the injections. Supplies needed:  Soap and water.  A new, unused insulin syringe.  Insulin medicine in small bottles (vials).  Alcohol wipes.  A disposal container for sharp items (sharps container), such as an empty plastic bottle with a cover. How to choose a site for injection The body absorbs insulin differently, depending on where the insulin is injected (injection site). It is best to inject insulin into the same body area each time (for example, always in the abdomen), but you should use a different spot in that area for each injection. Do not inject the insulin in the same spot each time. There are five main areas that can be used for injecting. These areas are:  Abdomen. This is the preferred area.  Front of thigh.  Upper, outer side of thigh.  Upper, outer side of arm.  Upper, outer part of buttock.   How to give a single-dose insulin injection Get ready 1. Wash your hands with soap and water. If soap and water are not available, use hand sanitizer. 2. Test your blood sugar (glucose) level and write down that number. Follow any instructions from your health care provider about what to do if your blood glucose level is higher or lower than your normal range. 3. Use a new, unused insulin syringe each time you need to inject insulin. 4. Check to make sure you have the correct type of insulin syringe for the concentration of insulin that you are using. 5. Check the expiration date and the type of insulin that you are using. 6. If you are using CLEAR insulin, check to see that it is clear and free of clumps. 7. If you are using CLOUDY insulin, mix it by gently rolling the insulin vial between your palms several times. Do not shake the vial. 8. Remove the plastic  pop-top covering from the vial of insulin. This type of covering is present on a vial when it is new. 9. Use an alcohol wipe to clean the rubber top of the vial. 10. Remove the plastic cover from the syringe needle. Do not let the needle touch anything. Push air into the vial 1. To bring (draw up) air into the syringe, slowly pull back on the syringe plunger. Stop pulling the plunger when the dose indicator gets to the number of units that you will be using. 2. While you keep the vial right-side-up, poke the needle through the rubber top of the vial. Do not turn the vial upside down to do this. 3. Push the plunger all the way into the syringe. Doing that will push air into the vial. 4. Do not take the needle out of the vial yet. Fill the syringe 1. While the needle is still in the vial, turn the vial upside down and hold it at eye level. 2. Slowly pull back on the plunger. Stop pulling the plunger when the dose indicator gets to the desired number of units. 3. If you see air bubbles in the syringe, slowly move the plunger up and down 2 or 3 times to make them go away. ? If you had to move the plunger to get rid of air bubbles, pull back the plunger until the dose indicator returns to the correct dose. 4. Remove the needle from the vial. Do not let  the needle touch anything.   Inject the insulin 1. Use an alcohol wipe to clean the site where you will be inserting the needle. Let the site air-dry. 2. Hold the syringe in your writing hand like a pencil. 3. Use your other hand to pinch and hold about an inch (2.5 cm) of skin. Do not directly touch the cleaned part of the skin. 4. Gently but quickly, put the needle straight into the skin. The needle should be at a 90-degree angle (perpendicular) to the skin. 5. Push the needle in as far as it will go (to the hub). 6. When the needle is completely inserted into the skin, let go of the skin that you are pinching. Continue to hold the syringe in place  with your writing hand. 7. Use your thumb or index finger of your writing hand to push the plunger all the way into the syringe to inject the insulin. 8. Wait 5 seconds, then pull the needle straight out of the skin. This will allow all of the insulin to go from the syringe and needle into your body. 9. If bleeding occurs, press and hold gauze over the injection site until any bleeding stops. Do not rub the area. 10. Do not put the plastic cover back on the needle. 11. Discard the syringe and needle directly into a sharps container.   How to throw away supplies  Discard all used needles in a sharps container.  Follow the disposal regulations for the area where you live. Do not use any syringe or needle more than one time.  Throw away empty vials in the regular trash. Questions to ask your health care provider  How often should I be taking insulin?  How often should I check my blood glucose?  What amount of insulin should I be taking at each time?  What are the side effects?  What should I do if my blood glucose is too high?  What should I do if my blood glucose is too low?  What should I do if I forget to take my insulin?  What number should I call if I have questions? Where to find more information  American Diabetes Association (ADA): diabetes.org  Association of Diabetes Care and Education Specialists (ADCES): diabeteseducator.org Summary  The type of insulin that you take may determine how many injections you give yourself and when you need to give the injections.  Before you give yourself an insulin injection, be sure to wash your hands and test your blood sugar (glucose) level. Write down that number.  Check the expiration date and the type of insulin that you are using.  It is best to inject insulin into the same body area each time (for example, always in the abdomen), but you should use a different spot in that area for each injection.  Do not use an insulin  syringe more than one time. This information is not intended to replace advice given to you by your health care provider. Make sure you discuss any questions you have with your health care provider. Document Revised: 07/31/2019 Document Reviewed: 08/03/2019 Elsevier Patient Education  2021 [place]. Blood Glucose Monitoring, Adult Monitoring your blood sugar (glucose) is an important part of managing your diabetes. Blood glucose monitoring involves checking your blood glucose as often as directed and keeping a log or record of your results over time. Checking your blood glucose regularly and keeping a blood glucose log can:  Help you and your health care provider adjust  your diabetes management plan as needed, including your medicines or insulin.  Help you understand how food, exercise, illnesses, and medicines affect your blood glucose.  Let you know what your blood glucose is at any time. You can quickly find out if you have low blood glucose (hypoglycemia) or high blood glucose (hyperglycemia). Your health care provider will set individualized treatment goals for you. Your goals will be based on your age, other medical conditions you have, and how you respond to diabetes treatment. Generally, the goal of treatment is to maintain the following blood glucose levels:  Before meals (preprandial): 80-130 mg/dL (4.4-7.2 mmol/L).  After meals (postprandial): below 180 mg/dL (10 mmol/L).  A1C level: less than 7%. Supplies needed:  Blood glucose meter.  Test strips for your meter. Each meter has its own strips. You must use the strips that came with your meter.  A needle to prick your finger (lancet). Do not use a lancet more than one time.  A device that holds the lancet (lancing device).  A journal or log book to write down your results. How to check your blood glucose Checking your blood glucose 1. Wash your hands for at least 20 seconds with soap and water. 2. Prick the side of  your finger (not the tip) with the lancet. Do not use the same finger consecutively. 3. Gently rub the finger until a small drop of blood appears. 4. Follow instructions that come with your meter for inserting the test strip, applying blood to the strip, and using your blood glucose meter. 5. Write down your result and any notes in your log.   Using alternative sites Some meters allow you to use areas of your body other than your finger (alternative sites) to test your blood. The most common alternative sites are the forearm, the thigh, and the palm of your hand. Alternative sites may not be as accurate as the fingers because blood flow is slower in those areas. This means that the result you get may be delayed, and it may be different from the result that you would get from your finger. Use the finger only, and do not use alternative sites, if:  You think you have hypoglycemia.  You sometimes do not know that your blood glucose is getting low (hypoglycemia unawareness). General tips and recommendations Blood glucose log  Every time you check your blood glucose, write down your result. Also write down any notes about things that may be affecting your blood glucose, such as your diet and exercise for the day. This information can help you and your health care provider: ? Look for patterns in your blood glucose over time. ? Adjust your diabetes management plan as needed.  Check if your meter allows you to download your records to a computer or if there is an app for the meter. Most glucose meters store a record of glucose readings in the meter.   If you have type 1 diabetes:  Check your blood glucose 4 or more times a day if you are on intensive insulin therapy with multiple daily injections (MDI) or if you are using an insulin pump. Check your blood glucose: ? Before every meal and snack. ? Before bedtime.  Also check your blood glucose: ? If you have symptoms of hypoglycemia. ? After  treating low blood glucose. ? Before doing activities that create a risk for injury, like driving or using machinery. ? Before and after exercise. ? Two hours after a meal. ? Occasionally between 2:00  a.m. and 3:00 a.m., as directed.  You may need to check your blood glucose more often, 6-10 times per day, if: ? You have diabetes that is not well controlled. ? You are ill. ? You have a history of severe hypoglycemia. ? You have hypoglycemia unawareness. If you have type 2 diabetes:  Check your blood glucose 2 or more times a day if you take insulin or other diabetes medicines.  Check your blood glucose 4 or more times a day if you are on intensive insulin therapy. Occasionally, you may also need to check your glucose between 2:00 a.m. and 3:00 a.m., as directed.  Also check your blood glucose: ? Before and after exercise. ? Before doing activities that create a risk for injury, like driving or using machinery.  You may need to check your blood glucose more often if: ? Your medicine is being adjusted. ? Your diabetes is not well controlled. ? You are ill. General tips  Make sure you always have your supplies with you.  After you use a few boxes of test strips, adjust (calibrate) your blood glucose meter by following instructions that came with your meter.  If you have questions or need help, all blood glucose meters have a 24-hour hotline phone number available that you can call. Also contact your health care provider with questions or concerns you may have. Where to find more information  The American Diabetes Association: www.diabetes.org  The Association of Diabetes Care & Education Specialists: www.diabeteseducator.org Contact a health care provider if:  Your blood glucose is at or above 240 mg/dL (13.3 mmol/L) for 2 days in a row.  You have been sick or have had a fever for 2 days or longer, and you are not getting better.  You have any of the following problems for  more than 6 hours: ? You cannot eat or drink. ? You have nausea or vomiting. ? You have diarrhea. Get help right away if:  Your blood glucose is lower than 54 mg/dL (3 mmol/L).  You become confused, or you have trouble thinking clearly.  You have difficulty breathing.  You have moderate or large ketone levels in your urine. These symptoms may represent a serious problem that is an emergency. Do not wait to see if the symptoms will go away. Get medical help right away. Call your local emergency services (911 in the [place] Do not drive yourself to the hospital. Summary  Monitoring your blood glucose is an important part of managing your diabetes.  Blood glucose monitoring involves checking your blood glucose as often as directed and keeping a log or record of your results over time.  Your health care provider will set individualized treatment goals for you. Your goals will be based on your age, other medical conditions you have, and how you respond to diabetes treatment.  Every time you check your blood glucose, write down your result. Also, write down any notes about things that may be affecting your blood glucose, such as your diet and exercise for the day. This information is not intended to replace advice given to you by your health care provider. Make sure you discuss any questions you have with your health care provider. Document Revised: 05/15/2020 Document Reviewed: 05/15/2020 Elsevier Patient Education  2021 [place]. Hypoglycemia Hypoglycemia is when the sugar (glucose) level in your blood is too low. Low blood sugar can happen to people who have diabetes and people who do not have diabetes. Low blood sugar can happen quickly,  and it can be an emergency. What are the causes? This condition happens most often in people who have diabetes and may be caused by:  Diabetes medicine.  Not eating enough, or not eating often enough.  Doing more physical activity.  Drinking  alcohol on an empty stomach. If you do not have diabetes, hypoglycemia may be caused by:  A tumor in the pancreas.  Not eating enough, or not eating for long periods at a time (fasting).  A very bad infection or illness.  Problems after having weight loss (bariatric) surgery.  Kidney failure or liver failure.  Certain medicines. What increases the risk? This condition is more likely to develop in people who:  Have diabetes and take medicines to lower their blood sugar.  Abuse alcohol.  Have a very bad illness. What are the signs or symptoms? Symptoms depend on whether your low blood sugar is mild, moderate, or very low. Mild  Hunger.  Feeling worried or nervous (anxious).  Sweating and feeling clammy.  Feeling dizzy or light-headed.  Being sleepy or having trouble sleeping.  Feeling like you may vomit (nauseous).  A fast heartbeat.  A headache.  Blurry vision.  Being irritable or grouchy.  Tingling or loss of feeling (numbness) around your mouth, lips, or tongue.  Trouble with moving (coordination). Moderate  Confusion and poor judgment.  Behavior changes.  Weakness.  Uneven heartbeats. Very low Very low blood sugar (severe hypoglycemia) is a medical emergency. It can cause:  Fainting.  Jerky movements that you cannot control (seizure).  Loss of consciousness (coma).  Death. How is this treated? Treating low blood sugar Low blood sugar is often treated by eating or drinking something sugary right away. The snack should contain 15 grams of a fast-acting carb (carbohydrate). Options include:  4 oz (120 mL) of fruit juice.  4-6 oz (120-150 mL) of regular soda (not diet soda).  8 oz (240 mL) of low-fat milk.  Several pieces of hard candy. Check food labels to find out how many to eat for 15 grams.  1 Tbsp (15 mL) of sugar or honey. Treating low blood sugar if you have diabetes If you can think clearly and swallow safely, follow the 15:15  rule:  Take 15 grams of a fast-acting carb. Talk with your doctor about how much you should take.  Always keep a source of fast-acting carb with you, such as: ? Sugar tablets (glucose pills). Take 4 pills. ? Several pieces of hard candy. Check food labels to see how many pieces to eat for 15 grams. ? 4 oz (120 mL) of fruit juice. ? 4-6 oz (120-150 mL) of regular (not diet) soda. ? 1 Tbsp (15 mL) of honey or sugar.  Check your blood sugar 15 minutes after you take the carb.  If your blood sugar is still at or below 70 mg/dL (3.9 mmol/L), take 15 grams of a carb again.  If your blood sugar does not go above 70 mg/dL (3.9 mmol/L) after 3 tries, get help right away.  After your blood sugar goes back to normal, eat a meal or a snack within 1 hour.   Treating very low blood sugar If your blood sugar is at or below 54 mg/dL (3 mmol/L), you have very low blood sugar, or severe hypoglycemia. This is an emergency. Get medical help right away. If you have very low blood sugar and you cannot eat or drink, you will need to be given a hormone called glucagon. A family member  or friend should learn how to check your blood sugar and how to give you glucagon. Ask your doctor if you need to have an emergency glucagon kit at home. Very low blood sugar may also need to be treated in a hospital. Follow these instructions at home: General instructions  Take over-the-counter and prescription medicines only as told by your doctor.  Stay aware of your blood sugar as told by your doctor.  If you drink alcohol: ? Limit how much you use to:  0-1 drink a day for nonpregnant women.  0-2 drinks a day for men. ? Be aware of how much alcohol is in your drink. In the [place] one drink equals one 12 oz bottle of beer (355 mL), one 5 oz glass of wine (148 mL), or one 1 oz glass of hard liquor (44 mL).  Keep all follow-up visits as told by your doctor. This is important. If you have diabetes:  Always have a  rapid-acting carb (15 grams) option with you to treat low blood sugar.  Follow your diabetes care plan as told by your doctor. Make sure you: ? Know the symptoms of low blood sugar. ? Check your blood sugar as often as told by your doctor. Always check it before and after exercise. ? Always check your blood sugar before you drive. ? Take your medicines as told. ? Follow your meal plan. ? Eat on time. Do not skip meals.  Share your diabetes care plan with: ? Your work or school. ? People you live with.  Carry a card or wear jewelry that says you have diabetes.   Contact a doctor if:  You have trouble keeping your blood sugar in your target range.  You have low blood sugar often. Get help right away if:  You still have symptoms after you eat or drink something that contains 15 grams of fast-acting carb and you cannot get your blood sugar above 70 mg/dL by following the 15:15 rule.  Your blood sugar is at or below 54 mg/dL (3 mmol/L).  You have a seizure.  You faint. These symptoms may be an emergency. Do not wait to see if the symptoms will go away. Get medical help right away. Call your local emergency services (911 in the [place] Do not drive yourself to the hospital. Summary  Hypoglycemia happens when the level of sugar (glucose) in your blood is too low.  Low blood sugar can happen to people who have diabetes and people who do not have diabetes. Low blood sugar can happen quickly, and it can be an emergency.  Make sure you know the symptoms of low blood sugar and know how to treat it.  Always keep a source of sugar (fast-acting carb) with you to treat low blood sugar. This information is not intended to replace advice given to you by your health care provider. Make sure you discuss any questions you have with your health care provider. Document Revised: 07/12/2019 Document Reviewed: 07/12/2019 Elsevier Patient Education  2021 [place]. Hyperglycemia Hyperglycemia  occurs when the level of sugar (glucose) in the blood is too high. Glucose is a type of sugar that provides the body's main source of energy. Certain hormones (insulin and glucagon) control the level of glucose in the blood. Insulin lowers blood glucose, and glucagon increases blood glucose. Hyperglycemia can result from not having enough insulin in the bloodstream, or from the body not responding normally to insulin. Hyperglycemia occurs most often in people who have diabetes (diabetes  mellitus), but it can happen in people who do not have diabetes. It can develop quickly, and it can be life-threatening if it causes you to become severely dehydrated (diabetic ketoacidosis or hyperglycemic hyperosmolar state). Severe hyperglycemia is a medical emergency. For most people with diabetes, a blood glucose level above 240 mg/dL is considered hyperglycemia. What are the causes? If you have diabetes, hyperglycemia may be caused by:  Medicines that increase blood glucose or affect your diabetes control.  Getting less physical activity.  Eating more than planned.  Being sick or injured, having an infection, or having surgery.  Stress.  Not giving yourself enough insulin (if you are taking insulin). If you have undiagnosed diabetes, this may be the reason you have hyperglycemia. If you do not have diabetes, hyperglycemia may be caused by:  Certain medicines, including: ? Steroid medicines. ? Beta-blockers. ? Epinephrine. ? Thiazide diuretics.  Stress.  Having a serious illness, an infection, or surgery.  Diseases of the pancreas. What increases the risk? Hyperglycemia is more likely to develop in people who have risk factors for diabetes, such as:  Having a family member with diabetes.  Certain conditions in which the body's disease-fighting system (immune system) attacks itself (autoimmune disorders).  Being overweight or obese.  Having an inactive (sedentary) lifestyle.  Having been  diagnosed with insulin resistance.  Having a history of prediabetes, gestational diabetes, or polycystic ovarian syndrome (PCOS). What are the signs or symptoms? Hyperglycemia may not cause any symptoms. If you do have symptoms, they may include:  Increased thirst.  Needing to urinate more often than usual.  Hunger.  Feeling very tired.  Blurry vision. Other symptoms may develop if hyperglycemia gets worse, such as:  Dry mouth.  Abdominal pain.  Loss of appetite.  Fruity-smelling breath.  Weakness.  Unexpected weight loss.  Tingling or numbness in the hands or feet.  Headache.  Cuts or bruises that are slow to heal. How is this diagnosed? Hyperglycemia is diagnosed with a blood test to measure your blood glucose level. This blood test is usually done while you are having symptoms. Your health care provider may also do a physical exam and review your medical history. You may have more tests to determine the cause of your hyperglycemia, such as:  A fasting blood glucose (FBG) test. You will not be allowed to eat (you will fast) for at least 8 hours before a blood sample is taken.  An A1C blood test. This provides information about blood glucose control over the previous 2-3 months.  An oral glucose tolerance test (OGTT). This measures your blood glucose at two times: ? After fasting. This is your baseline blood glucose level. ? 2 hours after drinking a beverage that contains glucose. How is this treated? Treatment depends on the cause of your hyperglycemia. Treatment may include:  Taking medicine to regulate your blood glucose levels. If you take insulin or other diabetes medicines, your medicine or dosage may be adjusted.  Lifestyle changes, such as exercising more, eating healthier foods, or losing weight.  Treating an illness or infection.  Checking your blood glucose more often.  Stopping or reducing steroid medicines. If your hyperglycemia becomes severe  and it results in diabetic ketoacidosis or hyperglycemic hyperosmolar state, you must be hospitalized and given IV fluids and IV insulin. Follow these instructions at home: General instructions  Take over-the-counter and prescription medicines only as told by your health care provider.  Do not use any products that contain nicotine or tobacco. These products  include cigarettes, chewing tobacco, and vaping devices, such as e-cigarettes. If you need help quitting, ask your health care provider.  If you drink alcohol: ? Limit how much you have to:  0-1 drink a day for women who are not pregnant.  0-2 drinks a day for men. ? Know how much alcohol is in a drink. In the [place] one drink equals one 12 oz bottle of beer (355 mL), one 5 oz glass of wine (148 mL), or one 1 oz glass of hard liquor (44 mL).  Learn to manage stress. If you need help with this, ask your health care provider.  Do exercises as told by your health care provider.  Keep all follow-up visits. This is important. Eating and drinking  Maintain a healthy weight.  Stay hydrated, especially when you exercise, get sick, or spend time in hot temperatures.  Drink enough fluid to keep your urine pale yellow.   If you have diabetes:  Know the symptoms of hyperglycemia.  Follow your diabetes management plan as told by your health care provider. Make sure you: ? Take your insulin and medicines as told. ? Follow your exercise plan. ? Follow your meal plan. Eat on time, and do not skip meals. ? Check your blood glucose as often as told. Make sure to check your blood glucose before and after exercise. If you exercise longer or in a different way, check your blood glucose more often. ? Follow your sick day plan whenever you cannot eat or drink normally. Make this plan in advance with your health care provider.  Share your diabetes management plan with people in your workplace, school, and household.  Check your urine for  ketones when you are ill and as told by your health care provider.  Carry a medical alert card or wear medical alert jewelry.   Where to find more information American Diabetes Association: www.diabetes.org Contact a health care provider if:  Your blood glucose is at or above 240 mg/dL (13.3 mmol/L) for 2 days in a row.  You have problems keeping your blood glucose in your target range.  You have frequent episodes of hyperglycemia.  You have signs of illness, such as nausea, vomiting, or fever. Get help right away if:  Your blood glucose monitor reads "high" even when you are taking insulin.  You have trouble breathing.  You have a change in how you think, feel, or act (mental status).  You have nausea or vomiting that does not go away. These symptoms may represent a serious problem that is an emergency. Do not wait to see if the symptoms will go away. Get medical help right away. Call your local emergency services (911 in the [place] Do not drive yourself to the hospital. Summary  Hyperglycemia occurs when the level of sugar (glucose) in the blood is too high.  Hyperglycemia can happen with or without diabetes, and severe hyperglycemia can be life-threatening.  Hyperglycemia is diagnosed with a blood test to measure your blood glucose level. This blood test is usually done while you are having symptoms. Your health care provider may also do a physical exam and review your medical history.  If you have diabetes, follow your diabetes management plan as told by your health care provider.  Contact your health care provider if you have problems keeping your blood glucose in your target range. This information is not intended to replace advice given to you by your health care provider. Make sure you discuss any questions you  have with your health care provider. Document Revised: 05/31/2020 Document Reviewed: 05/31/2020 Elsevier Patient Education  2021 [employer]. Hemoglobin A1C  Test Why am I having this test? You may have the hemoglobin A1C test (A1C test) done to:  Evaluate your risk for developing diabetes (diabetes mellitus).  Diagnose diabetes.  Monitor long-term control of blood sugar (glucose) in people who have diabetes and help make treatment decisions. This test may be done with other blood glucose tests, such as fasting blood glucose and oral glucose tolerance tests. What is being tested? Hemoglobin is a type of protein in the blood that carries oxygen. Glucose attaches to hemoglobin to form glycated hemoglobin. This test checks the amount of glycated hemoglobin in your blood, which is a good indicator of the average amount of glucose in your blood during the past 2-3 months. What kind of sample is taken? A blood sample is required for this test. It is usually collected by inserting a needle into a blood vessel.   Tell a health care provider about:  All medicines you are taking, including vitamins, herbs, eye drops, creams, and over-the-counter medicines.  Any blood disorders you have.  Any surgeries you have had.  Any medical conditions you have.  Whether you are pregnant or may be pregnant. How are the results reported? Your results will be reported as a percentage that indicates how much of your hemoglobin has glucose attached to it (is glycated). Your health care provider will compare your results to normal ranges that were established after testing a large group of people (reference ranges). Reference ranges may vary among labs and hospitals. For this test, common reference ranges are:  Adult or child without diabetes: 4-5.6%.  Adult or child with diabetes and good blood glucose control: less than 7%. What do the results mean? If you have diabetes:  A result of less than 7% is considered normal, meaning that your blood glucose is well controlled.  A result higher than 7% means that your blood glucose is not well controlled, and your  treatment plan may need to be adjusted. If you do not have diabetes:  A result within the reference range is considered normal, meaning that you are not at high risk for diabetes.  A result of 5.7-6.4% means that you have a high risk of developing diabetes, and you have prediabetes. Prediabetes is the condition of having a blood glucose level that is higher than it should be but not high enough for you to be diagnosed with diabetes. Having prediabetes puts you at risk for developing type 2 diabetes. You may have more tests, including a repeat A1C test.  Results of 6.5% or higher on two separate A1C tests mean that you have diabetes. You may have more tests to confirm the diagnosis. Abnormally low A1C values may be caused by:  Pregnancy.  Severe blood loss.  Receiving donated blood (transfusions).  Low red blood cell count (anemia).  Long-term kidney failure.  Some unusual forms (variants) of hemoglobin. Talk with your health care provider about what your results mean. Questions to ask your health care provider Ask your health care provider, or the department that is doing the test:  When will my results be ready?  How will I get my results?  What are my treatment options?  What other tests do I need?  What are my next steps? Summary  The A1C test may be done to evaluate your risk for developing diabetes, to diagnose diabetes, and to monitor long-term  control of blood sugar (glucose) in people who have diabetes and help make treatment decisions.  Hemoglobin is a type of protein in the blood that carries oxygen. Glucose attaches to hemoglobin to form glycated hemoglobin. This test checks the amount of glycated hemoglobin in your blood, which is a good indicator of the average amount of glucose in your blood during the past 2-3 months.  Talk with your health care provider about what your results mean. This information is not intended to replace advice given to you by your health  care provider. Make sure you discuss any questions you have with your health care provider. Document Revised: 05/15/2020 Document Reviewed: 05/15/2020 Elsevier Patient Education  2021 [employer].

## 2020-11-01 NOTE — Discharge Summary (Addendum)
Physician Discharge Summary  EZIO WIECK JKK:[id number] DOB: 1960-11-23 DOA: 10/28/2020  PCP: [name], NP  Admit date: 10/28/2020 Discharge date: 11/01/2020  Admitted From: Home Disposition: Home Recommendations for Outpatient Follow-up:  1. Follow up with PCP in 1-2 weeks 2. Please obtain BMP/CBC in one week  Home Health: None  equipment/Devices: None  Discharge Condition: Stable CODE STATUS: Full code Diet recommendation: Carb modified cardiac diet Brief/Interim Summary: 60 year old male with past medical history of HCV, GERD, Barrett's esophagus, diabetes, depression who presented to the ED today from an urgent care with complaints of left great toe painsince Friday.He was working in the woods with his friend over the past week and felt like his foot was rubbing against his shoe the whole time.Noted a blister on his toe 2 days ago and this morning the blister had ruptured.Subjectivefevers this morning. He has been taking leftover doxycyclinefrom a prior infectionsince last night for this.He also recently had right hand cellulitis with abscesses and was prescribed clindamycin in January at an urgent care for this and they are currently healing. Regarding his diabetes, states that his sugar is frequently 400+ but does not check frequently.   ED Course:Afebrile, tachycardic, tachypneic, hypotensive(84/54) and resolved with fluids, on room air. Notable Labs:Sodium 128, K3.1, chloride 91, glucose 449, BUN 21, creatinine 1.21, WBC 21.1, Hb 14.4, INR 1.3, lactic acid 1.9, COVID-19 pending. Notable Imaging:CXR unremarkable. Left foot XR-soft tissue swelling of the great toe and area of the first MTP joint without evidence of osteomyelitis, arthropathy or other acute findings. Patient received2 L LR bolus, Zosyn, vancomycin.  Discharge Diagnoses:  Principal Problem:   Sepsis ([place]) Active Problems:   Tobacco use disorder   Type 2 diabetes mellitus with  hyperlipidemia (H[facility])   Essential hypertension   Cellulitis in diabetic foot ([place])  #1 Severe sepsis - secondary to left foot cellulitis/diabetic foot present on admission-patient met sepsis criteria on admission with tachypnea respiratory rate 35, fever of 101.7 heart rate above 120 tachycardia and leukocytosis White count 21.1, lactic acid 2.5, with endorgan damage such as AKI creatinine on presentation was 1.21 with his baseline creatinine of 0.75. He was started on IV fluids per sepsis protocol with improvement in his blood pressure. He was being treated with Vanco cefepime and Flagyl. Wound culture and blood culture growing Klebsiella oxytoca sensitive to Rocephin.  echocardiogram no endocarditis MRI of the left foot did not reveal evidence of osteomyelitis but showed severe cellulitis.  White count 13.2 from 14.1 from 16.7  from 21. He will be discharged on Bactrim and Augmentin for 2 weeks.  Wound consult noted-Measurement:2 cm x 6 cm circumferential lesion to left great toe, ruptured blood filled blister.  LEft plantar foot at great toe 2 cm x 2.5 cm with scabbing to wound bed. Wound ZJI:RCVELFYBOFB tissue.  #2 uncontrolled type 2 diabetes with hyperglycemia with an A1c of 11.2 with a blood sugar of 449.  Patient reports his blood sugar usually runs in the four hundreds at home.  CBG (last 3)  Recent Labs (last 2 labs)        Recent Labs    10/30/20 2032 10/31/20 0730 10/31/20 1119  GLUCAP 276* 186* 238*    He was agreeable to take insulin on discharge.  He will be discharged on 70/30 insulin 20 units twice a day.  Appreciate diabetic coordinator input.  #3 history of hypertension patient is on amlodipine and losartan at home which has been on hold due to hypotension which is thought  to be due to sepsis. Will restart losartan at a lower dose 50 mg daily.  9/74 on discharge.    #4 history of hyperlipidemia on statin  #5 history of tobacco encouraged to quit smoking  nicotine patch ordered.  #6 history of Barrett's esophagus continue PPI.  #7 hyponatremia -sodium 130 on discharge.  #8 hypokalemia potassium was 3.3 on discharge repleted with 40 mEq of K. Dur.    Estimated body mass index is 23.43 kg/m as calculated from the following:   Height as of this encounter: 5' 11"  (1.803 m).   Weight as of this encounter: 76.2 kg.  Discharge Instructions  Discharge Instructions    Diet - low sodium heart healthy   Complete by: As directed    Discharge wound care:   Complete by: As directed    Cleanse left toe and plantar foot with normal saline and pat dry.  Apply Xeroform to open wounds.  Covered with dry dressing and Kerlix/tape.  Change daily.   Increase activity slowly   Complete by: As directed      Allergies as of 11/01/2020      Reactions   Bee Venom Anaphylaxis   Lactose Intolerance (gi) Other (See Comments)   GI upset      Medication List    STOP taking these medications   amLODipine 10 MG tablet Commonly known as: NORVASC   doxycycline 100 MG capsule Commonly known as: VIBRAMYCIN   ibuprofen 200 MG tablet Commonly known as: ADVIL   mupirocin ointment 2 % Commonly known as: Bactroban     TAKE these medications   albuterol 108 (90 Base) MCG/ACT inhaler Commonly known as: Ventolin HFA Inhale 2 puffs into the lungs every 6 (six) hours as needed for wheezing or shortness of breath.   amoxicillin-clavulanate 875-125 MG tablet Commonly known as: Augmentin Take 1 tablet by mouth 2 (two) times daily for 14 days.   aspirin EC 81 MG tablet Take 1 tablet (81 mg total) by mouth daily.   atorvastatin 40 MG tablet Commonly known as: LIPITOR Take 1 tablet (40 mg total) by mouth daily. Please fill as a 90 day supply.   canagliflozin 300 MG Tabs tablet Commonly known as: Invokana Take 1 tablet (300 mg total) by mouth daily before breakfast. Please fill as a 90 day supply   DULoxetine 60 MG capsule Commonly known as:  CYMBALTA Take 1 capsule (60 mg total) by mouth daily. Please fill as a 90 day supply   EPINEPHrine 0.3 mg/0.3 mL Soaj injection Commonly known as: EPI-PEN Inject 0.3 mg into the muscle as needed for anaphylaxis.   folic acid 1 MG tablet Commonly known as: FOLVITE Take 1 tablet (1 mg total) by mouth daily.   glipiZIDE 5 MG tablet Commonly known as: GLUCOTROL Take 1 tablet (5 mg total) by mouth daily before breakfast. Please fill as a 90 day supply   loperamide 2 MG tablet Commonly known as: Imodium A-D Take 1 tablet (2 mg total) by mouth 4 (four) times daily as needed for diarrhea or loose stools.   loratadine 10 MG tablet Commonly known as: CLARITIN Take 1 tablet (10 mg total) by mouth daily. Please fill as a 90 day supply   losartan 100 MG tablet Commonly known as: COZAAR Take 0.5 tablets (50 mg total) by mouth daily. Please fill as a 90 day supply What changed: how much to take   mirtazapine 15 MG tablet Commonly known as: REMERON Take 1 tablet (15 mg total) by  mouth at bedtime. Please fill as a 90 day supply   Multivitamin Adult Tabs Take 1 tablet by mouth daily.   nicotine 21 mg/24hr patch Commonly known as: NICODERM CQ - dosed in mg/24 hours Place 1 patch (21 mg total) onto the skin daily. Start taking on: November 02, 2020   NovoLOG 70/30 FlexPen ReliOn (70-30) 100 UNIT/ML FlexPen Generic drug: insulin aspart protamine - aspart Inject 0.2 mLs (20 Units total) into the skin 2 (two) times daily.   omeprazole 40 MG capsule Commonly known as: PRILOSEC Take 1 capsule (40 mg total) by mouth daily. Please fill as a 90 day supply   sulfamethoxazole-trimethoprim 800-160 MG tablet Commonly known as: BACTRIM DS Take 1 tablet by mouth 2 (two) times daily.            Discharge Care Instructions  (From admission, onward)         Start     Ordered   11/01/20 0000  Discharge wound care:       Comments: Cleanse left toe and plantar foot with normal saline and pat dry.   Apply Xeroform to open wounds.  Covered with dry dressing and Kerlix/tape.  Change daily.   11/01/20 1211          Follow-up Information    [name], NP Follow up.   Specialty: Nurse Practitioner Why: Appointment at 1030 AM, arrive at 10:15 AM Contact information: [place] [address] [phone]              Allergies  Allergen Reactions  . Bee Venom Anaphylaxis  . Lactose Intolerance (Gi) Other (See Comments)    GI upset    Consultations:  None   Procedures/Studies: DG Chest 2 View  Result Date: 10/28/2020 CLINICAL DATA:  Sepsis. EXAM: CHEST - 2 VIEW COMPARISON:  05/12/2020 FINDINGS: The lungs are clear without focal pneumonia, edema, pneumothorax or pleural effusion. Streaky/linear opacity at the left base compatible with atelectasis or scarring. The cardiopericardial silhouette is within normal limits for size. Telemetry leads overlie the chest. IMPRESSION: No active cardiopulmonary disease. Electronically Signed   By: Misty Stanley M.D.   On: 10/28/2020 10:12   MR FOOT LEFT WO CONTRAST  Result Date: 10/28/2020 CLINICAL DATA:  Diabetic patient with a skin wound on the left foot with redness, pain and swelling. EXAM: MRI OF THE LEFT FOOT WITHOUT CONTRAST TECHNIQUE: Multiplanar, multisequence MR imaging of the left foot was performed. No intravenous contrast was administered. COMPARISON:  None. FINDINGS: Bones/Joint/Cartilage No marrow edema to suggest osteomyelitis is identified. No fracture or dislocation. Small first MTP joint effusion is noted. Ligaments Intact. Muscles and Tendons No intramuscular fluid collection. Soft tissues There is subcutaneous edema about the foot which is worst on the dorsal side and about the great toe. A superficial fluid collection surrounding the lateral aspect of the great toe is likely a blister. Plantar skin wound is seen deep to the base of the proximal phalanx of the great toe. There is a skin wound in the  dorsal soft tissues at the level of the mid aspect of the great toe. No underlying abscess is identified. A small volume of fluid is seen in the first intermetatarsal bursa. IMPRESSION: Skin wounds on the foot without evidence of abscess, osteomyelitis or septic joint. Skin wounds about the great toe. Intense subcutaneous edema about the foot is worst at the great toe and consistent with cellulitis. No abscess is identified. Cutaneous T2 hyperintensity about the lateral aspect  of the great toe is likely a blister. Small volume of fluid in the first intermetatarsal bursa consistent with bursitis which could be septic or aseptic. Electronically Signed   By: Inge Rise M.D.   On: 10/28/2020 15:45   DG Foot Complete Left  Result Date: 10/28/2020 CLINICAL DATA:  Left great toe pain and swelling.  Diabetes. EXAM: LEFT FOOT - COMPLETE 3+ VIEW COMPARISON:  10/10/2018 FINDINGS: There is no evidence of acute fracture or dislocation. No evidence of osteolysis or periostitis. Old fracture deformity of the proximal phalanx of the 2nd toe is noted. There is no evidence of arthropathy or other focal bone abnormality. Soft tissue swelling is seen involving the great toe and area of the 1st MTP joint. No evidence of soft tissue gas or radiopaque foreign body. IMPRESSION: Soft tissue swelling involving the great toe and area of the 1st MTP joint. No radiographic evidence of osteomyelitis, arthropathy, or other acute findings. Electronically Signed   By: Marlaine Hind M.D.   On: 10/28/2020 10:14   ECHOCARDIOGRAM COMPLETE  Result Date: 10/30/2020    ECHOCARDIOGRAM REPORT   Patient Name:   SEVILLE DOWNS Date of Exam: 10/30/2020 Medical Rec #:  [id number]       Height:       71.0 in Accession #:    [id number]      Weight:       168.0 lb Date of Birth:  [birth year]       BSA:          1.958 m Patient Age:    60 years        BP:           118/66 mmHg Patient Gender: M               HR:           88 bpm. Exam Location:  Inpatient  Procedure: 2D Echo, Cardiac Doppler and Color Doppler Indications:    Bacteremia  History:        Patient has no prior history of Echocardiogram examinations.                 Signs/Symptoms:Fever and Sepsis; Risk Factors:Diabetes,                 Hypertension, Current Smoker and Hep.C.  Sonographer:    Dustin Flock Referring Phys: [id number] [place]  1. Left ventricular ejection fraction, by estimation, is 60 to 65%. The left ventricle has normal function. The left ventricle has no regional wall motion abnormalities. There is mild left ventricular hypertrophy. Left ventricular diastolic parameters were normal.  2. Right ventricular systolic function is normal. The right ventricular size is normal. Tricuspid regurgitation signal is inadequate for assessing PA pressure.  3. The mitral valve is normal in structure. Trivial mitral valve regurgitation. No evidence of mitral stenosis.  4. The aortic valve is tricuspid. Aortic valve regurgitation is not visualized. No aortic stenosis is present.  5. The inferior vena cava is dilated in size with <50% respiratory variability, suggesting right atrial pressure of 15 mmHg.  6. No evidence for endocarditis was seen. FINDINGS  Left Ventricle: Left ventricular ejection fraction, by estimation, is 60 to 65%. The left ventricle has normal function. The left ventricle has no regional wall motion abnormalities. The left ventricular internal cavity size was normal in size. There is  mild left ventricular hypertrophy. Left ventricular diastolic parameters were normal. Right Ventricle: The right ventricular size is normal.  No increase in right ventricular wall thickness. Right ventricular systolic function is normal. Tricuspid regurgitation signal is inadequate for assessing PA pressure. Left Atrium: Left atrial size was normal in size. Right Atrium: Right atrial size was normal in size. Pericardium: There is no evidence of pericardial effusion. Mitral Valve:  The mitral valve is normal in structure. Trivial mitral valve regurgitation. No evidence of mitral valve stenosis. Tricuspid Valve: The tricuspid valve is normal in structure. Tricuspid valve regurgitation is trivial. Aortic Valve: The aortic valve is tricuspid. Aortic valve regurgitation is not visualized. No aortic stenosis is present. Pulmonic Valve: The pulmonic valve was normal in structure. Pulmonic valve regurgitation is not visualized. Aorta: The aortic root is normal in size and structure. Venous: The inferior vena cava is dilated in size with less than 50% respiratory variability, suggesting right atrial pressure of 15 mmHg. IAS/Shunts: No atrial level shunt detected by color flow Doppler.  LEFT VENTRICLE PLAX 2D LVIDd:         4.80 cm  Diastology LVIDs:         2.80 cm  LV e' medial:    8.49 cm/s LV PW:         1.40 cm  LV E/e' medial:  13.3 LV IVS:        1.50 cm  LV e' lateral:   9.25 cm/s LVOT diam:     2.40 cm  LV E/e' lateral: 12.2 LV SV:         90 LV SV Index:   46 LVOT Area:     4.52 cm  RIGHT VENTRICLE RV Basal diam:  2.70 cm RV S prime:     12.90 cm/s TAPSE (M-mode): 2.5 cm LEFT ATRIUM             Index       RIGHT ATRIUM           Index LA diam:        4.00 cm 2.04 cm/m  RA Area:     14.00 cm LA Vol (A2C):   49.6 ml 25.33 ml/m RA Volume:   32.40 ml  16.55 ml/m LA Vol (A4C):   39.0 ml 19.92 ml/m LA Biplane Vol: 44.0 ml 22.47 ml/m  AORTIC VALVE LVOT Vmax:   98.30 cm/s LVOT Vmean:  68.200 cm/s LVOT VTI:    0.198 m  AORTA Ao Root diam: 3.60 cm MITRAL VALVE MV Area (PHT): 5.66 cm     SHUNTS MV Decel Time: 134 msec     Systemic VTI:  0.20 m MV E velocity: 113.00 cm/s  Systemic Diam: 2.40 cm MV A velocity: 61.60 cm/s MV E/A ratio:  1.83 Loralie Champagne MD Electronically signed by Loralie Champagne MD Signature Date/Time: 10/30/2020/5:06:34 PM    Final     (Echo, Carotid, EGD, Colonoscopy, ERCP)    Subjective: Patient is resting in bed awake and alert anxious to go home left foot covered with  dressing dressing saturated with drainage from the wound  Discharge Exam: Vitals:   10/31/20 2029 11/01/20 0441  BP: 130/75 139/74  Pulse: 71 77  Resp: 20 20  Temp: 98.4 F (36.9 C) 98.7 F (37.1 C)  SpO2: 93% 97%   Vitals:   10/31/20 0440 10/31/20 1319 10/31/20 2029 11/01/20 0441  BP: 136/74 (!) 112/59 130/75 139/74  Pulse: 79 83 71 77  Resp: 20 20 20 20   Temp: 99 F (37.2 C) 98.6 F (37 C) 98.4 F (36.9 C) 98.7 F (37.1 C)  TempSrc: Oral  Oral Oral Oral  SpO2: 94% 97% 93% 97%  Weight:      Height:        General: Pt is alert, awake, not in acute distress Cardiovascular: RRR, S1/S2 +, no rubs, no gallops Respiratory: CTA bilaterally, no wheezing, no rhonchi Abdominal: Soft, NT, ND, bowel sounds + Extremities: Left foot covered with dressing.   The results of significant diagnostics from this hospitalization (including imaging, microbiology, ancillary and laboratory) are listed below for reference.     Microbiology: Recent Results (from the past 240 hour(s))  Culture, blood (Routine x 2)     Status: None (Preliminary result)   Collection Time: 10/28/20  9:30 AM   Specimen: BLOOD  Result Value Ref Range Status   Specimen Description   Final    BLOOD RIGHT ANTECUBITAL Performed at [facility] [address] [place], [place] [address]    Special Requests   Final    BOTTLES DRAWN AEROBIC AND ANAEROBIC Blood Culture adequate volume Performed at [facility] [address], [place], [place] [address]    Culture   Final    NO GROWTH 4 DAYS Performed at [facility], [place] [address], [place], [place] [address]    Report Status PENDING  Incomplete  Culture, blood (Routine x 2)     Status: Abnormal   Collection Time: 10/28/20  9:35 AM   Specimen: BLOOD  Result Value Ref Range Status   Specimen Description   Final    BLOOD LEFT ANTECUBITAL Performed at [facility], [place] [address], [place], [place] [address]     Special Requests   Final    BOTTLES DRAWN AEROBIC AND ANAEROBIC Blood Culture adequate volume Performed at [facility] [address] [place], [place] [address]    Culture  Setup Time   Final    GRAM POSITIVE COCCI IN CHAINS ANAEROBIC BOTTLE ONLY CRITICAL RESULT CALLED TO, READ BACK BY AND VERIFIED WITH: PHARMD DREW W. 2031 [id number] FCP    Culture (A)  Final    GROUP A STREP (S.PYOGENES) ISOLATED HEALTH DEPARTMENT NOTIFIED Performed at [facility], [place] [address] [place],  [address]    Report Status 10/31/2020 FINAL  Final   Organism ID, Bacteria GROUP A STREP (S.PYOGENES) ISOLATED  Final      Susceptibility   Group a strep (s.pyogenes) isolated - MIC*    PENICILLIN <=0.06 SENSITIVE Sensitive     CEFTRIAXONE <=0.12 SENSITIVE Sensitive     ERYTHROMYCIN >=8 RESISTANT Resistant     LEVOFLOXACIN 0.5 SENSITIVE Sensitive     VANCOMYCIN <=0.12 SENSITIVE Sensitive     * GROUP A STREP (S.PYOGENES) ISOLATED  Blood Culture ID Panel (Reflexed)     Status: Abnormal   Collection Time: 10/28/20  9:35 AM  Result Value Ref Range Status   Enterococcus faecalis NOT DETECTED NOT DETECTED Final   Enterococcus Faecium NOT DETECTED NOT DETECTED Final   Listeria monocytogenes NOT DETECTED NOT DETECTED Final   Staphylococcus species NOT DETECTED NOT DETECTED Final   Staphylococcus aureus (BCID) NOT DETECTED NOT DETECTED Final   Staphylococcus epidermidis NOT DETECTED NOT DETECTED Final   Staphylococcus lugdunensis NOT DETECTED NOT DETECTED Final   Streptococcus species DETECTED (A) NOT DETECTED Final    Comment: CRITICAL RESULT CALLED TO, READ BACK BY AND VERIFIED WITH: PHARMD DREW W. 2031 [id number] FCP    Streptococcus agalactiae NOT DETECTED NOT DETECTED Final   Streptococcus pneumoniae NOT DETECTED NOT DETECTED  Final   Streptococcus pyogenes DETECTED (A) NOT DETECTED Final    Comment: CRITICAL RESULT CALLED TO, READ BACK BY AND VERIFIED WITH: PHARMD DREW W. 2031  [id number] FCP    A.calcoaceticus-baumannii NOT DETECTED NOT DETECTED Final   Bacteroides fragilis NOT DETECTED NOT DETECTED Final   Enterobacterales NOT DETECTED NOT DETECTED Final   Enterobacter cloacae complex NOT DETECTED NOT DETECTED Final   Escherichia coli NOT DETECTED NOT DETECTED Final   Klebsiella aerogenes NOT DETECTED NOT DETECTED Final   Klebsiella oxytoca NOT DETECTED NOT DETECTED Final   Klebsiella pneumoniae NOT DETECTED NOT DETECTED Final   Proteus species NOT DETECTED NOT DETECTED Final   Salmonella species NOT DETECTED NOT DETECTED Final   Serratia marcescens NOT DETECTED NOT DETECTED Final   Haemophilus influenzae NOT DETECTED NOT DETECTED Final   Neisseria meningitidis NOT DETECTED NOT DETECTED Final   Pseudomonas aeruginosa NOT DETECTED NOT DETECTED Final   Stenotrophomonas maltophilia NOT DETECTED NOT DETECTED Final   Candida albicans NOT DETECTED NOT DETECTED Final   Candida auris NOT DETECTED NOT DETECTED Final   Candida glabrata NOT DETECTED NOT DETECTED Final   Candida krusei NOT DETECTED NOT DETECTED Final   Candida parapsilosis NOT DETECTED NOT DETECTED Final   Candida tropicalis NOT DETECTED NOT DETECTED Final   Cryptococcus neoformans/gattii NOT DETECTED NOT DETECTED Final    Comment: Performed at [facility], [place]. [address] [place], [place] [address]  Resp Panel by RT-PCR (Flu A&B, Covid) Nasopharyngeal Swab     Status: None   Collection Time: 10/28/20  9:58 AM   Specimen: Nasopharyngeal Swab; Nasopharyngeal(NP) swabs in vial transport medium  Result Value Ref Range Status   SARS Coronavirus 2 by RT PCR NEGATIVE NEGATIVE Final    Comment: (NOTE) SARS-CoV-2 target nucleic acids are NOT DETECTED.  The SARS-CoV-2 RNA is generally detectable in upper respiratory specimens during the acute phase of infection. The lowest concentration of SARS-CoV-2 viral copies this assay can detect is 138 copies/mL. A negative result does not preclude  SARS-Cov-2 infection and should not be used as the sole basis for treatment or other patient management decisions. A negative result may occur with  improper specimen collection/handling, submission of specimen other than nasopharyngeal swab, presence of viral mutation(s) within the areas targeted by this assay, and inadequate number of viral copies(<138 copies/mL). A negative result must be combined with clinical observations, patient history, and epidemiological information. The expected result is Negative.  Fact Sheet for Patients:  EntrepreneurPulse.com.au  Fact Sheet for Healthcare Providers:  IncredibleEmployment.be  This test is no t yet approved or cleared by the [place] FDA and  has been authorized for detection and/or diagnosis of SARS-CoV-2 by FDA under an Emergency Use Authorization (EUA). This EUA will remain  in effect (meaning this test can be used) for the duration of the COVID-19 declaration under Section 564(b)(1) of the Act, 21 U.S.C.section 360bbb-3(b)(1), unless the authorization is terminated  or revoked sooner.       Influenza A by PCR NEGATIVE NEGATIVE Final   Influenza B by PCR NEGATIVE NEGATIVE Final    Comment: (NOTE) The Xpert Xpress SARS-CoV-2/FLU/RSV plus assay is intended as an aid in the diagnosis of influenza from Nasopharyngeal swab specimens and should not be used as a sole basis for treatment. Nasal washings and aspirates are unacceptable for Xpert Xpress SARS-CoV-2/FLU/RSV testing.  Fact Sheet for Patients: EntrepreneurPulse.com.au  Fact Sheet for Healthcare Providers: IncredibleEmployment.be  This test is not yet approved or cleared by the  [place] Architectural technologist and has been authorized for detection and/or diagnosis of SARS-CoV-2 by FDA under an Print production planner (EUA). This EUA will remain in effect (meaning this test can be used) for the duration of  the COVID-19 declaration under Section 564(b)(1) of the Act, 21 U.S.C. section 360bbb-3(b)(1), unless the authorization is terminated or revoked.  Performed at [facility], [place] [address], [place], [place] [address]   Aerobic/Anaerobic Culture w Gram Stain (surgical/deep wound)     Status: None (Preliminary result)   Collection Time: 10/28/20 10:03 AM   Specimen: Foot  Result Value Ref Range Status   Specimen Description   Final    FOOT Performed at [facility] [address], [place], [place] [address]    Special Requests   Final    NONE Performed at [facility], [place] [address] [place], [place] [address]    Gram Stain   Final    NO WBC SEEN FEW GRAM POSITIVE COCCI Performed at [facility], [place] [address] [place], [place] [address]    Culture   Final    ABUNDANT GROUP A STREP (S.PYOGENES) ISOLATED FEW KLEBSIELLA OXYTOCA Beta hemolytic streptococci are predictably susceptible to penicillin and other beta lactams. Susceptibility testing not routinely performed. NO ANAEROBES ISOLATED; CULTURE IN PROGRESS FOR 5 DAYS    Report Status PENDING  Incomplete   Organism ID, Bacteria KLEBSIELLA OXYTOCA  Final      Susceptibility   Klebsiella oxytoca - MIC*    AMPICILLIN >=32 RESISTANT Resistant     CEFAZOLIN 8 SENSITIVE Sensitive     CEFEPIME <=0.12 SENSITIVE Sensitive     CEFTAZIDIME <=1 SENSITIVE Sensitive     CEFTRIAXONE <=0.25 SENSITIVE Sensitive     CIPROFLOXACIN <=0.25 SENSITIVE Sensitive     GENTAMICIN <=1 SENSITIVE Sensitive     IMIPENEM <=0.25 SENSITIVE Sensitive     TRIMETH/SULFA <=20 SENSITIVE Sensitive     AMPICILLIN/SULBACTAM 16 INTERMEDIATE Intermediate     PIP/TAZO <=4 SENSITIVE Sensitive     * FEW KLEBSIELLA OXYTOCA     Labs: BNP (last 3 results) No results for input(s): BNP in the last 8760 hours. Basic Metabolic Panel: Recent Labs  Lab 10/29/20 1140 10/30/20 0354 10/30/20 1431  10/31/20 0411 11/01/20 0346  NA 132* 130* 131* 133* 130*  K 2.9* 2.5* 3.0* 3.3* 3.3*  CL 102 101 101 101 98  CO2 22 18* 22 20* 23  GLUCOSE 184* 140* 217* 205* 257*  BUN 18 11 9 7 8   CREATININE 0.68 0.52* 0.45* 0.51* 0.42*  CALCIUM 7.8* 7.6* 7.8* 8.2* 8.1*  MG  --   --  2.0  --   --    Liver Function Tests: Recent Labs  Lab 10/28/20 1014 10/30/20 0354 10/31/20 0411 11/01/20 0346  AST 20 21 16  14*  ALT 20 18 15 14   ALKPHOS 91 81 91 82  BILITOT 0.8 1.3* 0.6 0.7  PROT 8.1 5.9* 6.6 6.6  ALBUMIN 3.6 2.6* 2.7* 2.8*   No results for input(s): LIPASE, AMYLASE in the last 168 hours. No results for input(s): AMMONIA in the last 168 hours. CBC: Recent Labs  Lab 10/28/20 1014 10/29/20 1140 10/30/20 0354 10/31/20 0411 11/01/20 0346  WBC 21.1* 17.3* 16.2* 14.1* 13.2*  NEUTROABS 19.8*  --   --   --   --   HGB 14.4 12.1* 11.4* 12.8* 12.6*  HCT 43.2 35.7* 34.7* 38.2* 37.0*  MCV 90.4 89.9 90.4 89.3 88.5  PLT 202  169 179 211 217   Cardiac Enzymes: No results for input(s): CKTOTAL, CKMB, CKMBINDEX, TROPONINI in the last 168 hours. BNP: Invalid input(s): POCBNP CBG: Recent Labs  Lab 10/31/20 1119 10/31/20 1646 10/31/20 2027 11/01/20 0749 11/01/20 1142  GLUCAP 238* 237* 226* 251* 276*   D-Dimer No results for input(s): DDIMER in the last 72 hours. Hgb A1c No results for input(s): HGBA1C in the last 72 hours. Lipid Profile No results for input(s): CHOL, HDL, LDLCALC, TRIG, CHOLHDL, LDLDIRECT in the last 72 hours. Thyroid function studies No results for input(s): TSH, T4TOTAL, T3FREE, THYROIDAB in the last 72 hours.  Invalid input(s): FREET3 Anemia work up No results for input(s): VITAMINB12, FOLATE, FERRITIN, TIBC, IRON, RETICCTPCT in the last 72 hours. Urinalysis    Component Value Date/Time   COLORURINE YELLOW 10/28/2020 1014   APPEARANCEUR CLEAR 10/28/2020 1014   LABSPEC 1.026 10/28/2020 1014   PHURINE 5.0 10/28/2020 1014   GLUCOSEU >=500 (A) 10/28/2020 1014    HGBUR SMALL (A) 10/28/2020 1014   BILIRUBINUR NEGATIVE 10/28/2020 1014   BILIRUBINUR neg 08/03/2017 0959   KETONESUR 5 (A) 10/28/2020 1014   PROTEINUR NEGATIVE 10/28/2020 1014   UROBILINOGEN 0.2 08/03/2017 0959   UROBILINOGEN 0.2 09/28/2013 1520   NITRITE NEGATIVE 10/28/2020 1014   LEUKOCYTESUR NEGATIVE 10/28/2020 1014   Sepsis Labs Invalid input(s): PROCALCITONIN,  WBC,  LACTICIDVEN Microbiology Recent Results (from the past 240 hour(s))  Culture, blood (Routine x 2)     Status: None (Preliminary result)   Collection Time: 10/28/20  9:30 AM   Specimen: BLOOD  Result Value Ref Range Status   Specimen Description   Final    BLOOD RIGHT ANTECUBITAL Performed at [facility], [place] [address] [place], [place] [address]    Special Requests   Final    BOTTLES DRAWN AEROBIC AND ANAEROBIC Blood Culture adequate volume Performed at [place] [address], [place], [place] [address]    Culture   Final    NO GROWTH 4 DAYS Performed at [facility], [place] [address], [place], [place] [address]    Report Status PENDING  Incomplete  Culture, blood (Routine x 2)     Status: Abnormal   Collection Time: 10/28/20  9:35 AM   Specimen: BLOOD  Result Value Ref Range Status   Specimen Description   Final    BLOOD LEFT ANTECUBITAL Performed at [facility], [place] [address], [place], [place] [address]    Special Requests   Final    BOTTLES DRAWN AEROBIC AND ANAEROBIC Blood Culture adequate volume Performed at [place] [address], [place], [place] [address]    Culture  Setup Time   Final    GRAM POSITIVE COCCI IN CHAINS ANAEROBIC BOTTLE ONLY CRITICAL RESULT CALLED TO, READ BACK BY AND VERIFIED WITH: PHARMD DREW W. 2031 [id number] FCP    Culture (A)  Final    GROUP A STREP (S.PYOGENES) ISOLATED HEALTH DEPARTMENT NOTIFIED Performed at [facility], [place] [address], [place],  [address]    Report Status  10/31/2020 FINAL  Final   Organism ID, Bacteria GROUP A STREP (S.PYOGENES) ISOLATED  Final      Susceptibility   Group a strep (s.pyogenes) isolated - MIC*    PENICILLIN <=0.06 SENSITIVE Sensitive     CEFTRIAXONE <=0.12 SENSITIVE Sensitive     ERYTHROMYCIN >=8 RESISTANT Resistant     LEVOFLOXACIN 0.5 SENSITIVE Sensitive     VANCOMYCIN <=0.12 SENSITIVE Sensitive     *  GROUP A STREP (S.PYOGENES) ISOLATED  Blood Culture ID Panel (Reflexed)     Status: Abnormal   Collection Time: 10/28/20  9:35 AM  Result Value Ref Range Status   Enterococcus faecalis NOT DETECTED NOT DETECTED Final   Enterococcus Faecium NOT DETECTED NOT DETECTED Final   Listeria monocytogenes NOT DETECTED NOT DETECTED Final   Staphylococcus species NOT DETECTED NOT DETECTED Final   Staphylococcus aureus (BCID) NOT DETECTED NOT DETECTED Final   Staphylococcus epidermidis NOT DETECTED NOT DETECTED Final   Staphylococcus lugdunensis NOT DETECTED NOT DETECTED Final   Streptococcus species DETECTED (A) NOT DETECTED Final    Comment: CRITICAL RESULT CALLED TO, READ BACK BY AND VERIFIED WITH: PHARMD DREW W. 2031 [id number] FCP    Streptococcus agalactiae NOT DETECTED NOT DETECTED Final   Streptococcus pneumoniae NOT DETECTED NOT DETECTED Final   Streptococcus pyogenes DETECTED (A) NOT DETECTED Final    Comment: CRITICAL RESULT CALLED TO, READ BACK BY AND VERIFIED WITH: PHARMD DREW W. 2031 [id number] FCP    A.calcoaceticus-baumannii NOT DETECTED NOT DETECTED Final   Bacteroides fragilis NOT DETECTED NOT DETECTED Final   Enterobacterales NOT DETECTED NOT DETECTED Final   Enterobacter cloacae complex NOT DETECTED NOT DETECTED Final   Escherichia coli NOT DETECTED NOT DETECTED Final   Klebsiella aerogenes NOT DETECTED NOT DETECTED Final   Klebsiella oxytoca NOT DETECTED NOT DETECTED Final   Klebsiella pneumoniae NOT DETECTED NOT DETECTED Final   Proteus species NOT DETECTED NOT DETECTED Final   Salmonella species NOT DETECTED NOT  DETECTED Final   Serratia marcescens NOT DETECTED NOT DETECTED Final   Haemophilus influenzae NOT DETECTED NOT DETECTED Final   Neisseria meningitidis NOT DETECTED NOT DETECTED Final   Pseudomonas aeruginosa NOT DETECTED NOT DETECTED Final   Stenotrophomonas maltophilia NOT DETECTED NOT DETECTED Final   Candida albicans NOT DETECTED NOT DETECTED Final   Candida auris NOT DETECTED NOT DETECTED Final   Candida glabrata NOT DETECTED NOT DETECTED Final   Candida krusei NOT DETECTED NOT DETECTED Final   Candida parapsilosis NOT DETECTED NOT DETECTED Final   Candida tropicalis NOT DETECTED NOT DETECTED Final   Cryptococcus neoformans/gattii NOT DETECTED NOT DETECTED Final    Comment: Performed at [facility], [address], [place], [place] [address]  Resp Panel by RT-PCR (Flu A&B, Covid) Nasopharyngeal Swab     Status: None   Collection Time: 10/28/20  9:58 AM   Specimen: Nasopharyngeal Swab; Nasopharyngeal(NP) swabs in vial transport medium  Result Value Ref Range Status   SARS Coronavirus 2 by RT PCR NEGATIVE NEGATIVE Final    Comment: (NOTE) SARS-CoV-2 target nucleic acids are NOT DETECTED.  The SARS-CoV-2 RNA is generally detectable in upper respiratory specimens during the acute phase of infection. The lowest concentration of SARS-CoV-2 viral copies this assay can detect is 138 copies/mL. A negative result does not preclude SARS-Cov-2 infection and should not be used as the sole basis for treatment or other patient management decisions. A negative result may occur with  improper specimen collection/handling, submission of specimen other than nasopharyngeal swab, presence of viral mutation(s) within the areas targeted by this assay, and inadequate number of viral copies(<138 copies/mL). A negative result must be combined with clinical observations, patient history, and epidemiological information. The expected result is Negative.  Fact Sheet for Patients:   EntrepreneurPulse.com.au  Fact Sheet for Healthcare Providers:  IncredibleEmployment.be  This test is no t yet approved or cleared by the [place] FDA and  has been authorized for detection and/or  diagnosis of SARS-CoV-2 by FDA under an Emergency Use Authorization (EUA). This EUA will remain  in effect (meaning this test can be used) for the duration of the COVID-19 declaration under Section 564(b)(1) of the Act, 21 U.S.C.section 360bbb-3(b)(1), unless the authorization is terminated  or revoked sooner.       Influenza A by PCR NEGATIVE NEGATIVE Final   Influenza B by PCR NEGATIVE NEGATIVE Final    Comment: (NOTE) The Xpert Xpress SARS-CoV-2/FLU/RSV plus assay is intended as an aid in the diagnosis of influenza from Nasopharyngeal swab specimens and should not be used as a sole basis for treatment. Nasal washings and aspirates are unacceptable for Xpert Xpress SARS-CoV-2/FLU/RSV testing.  Fact Sheet for Patients: EntrepreneurPulse.com.au  Fact Sheet for Healthcare Providers: IncredibleEmployment.be  This test is not yet approved or cleared by the [place] FDA and has been authorized for detection and/or diagnosis of SARS-CoV-2 by FDA under an Emergency Use Authorization (EUA). This EUA will remain in effect (meaning this test can be used) for the duration of the COVID-19 declaration under Section 564(b)(1) of the Act, 21 U.S.C. section 360bbb-3(b)(1), unless the authorization is terminated or revoked.  Performed at [facility], [place] [address], [place], [place] [address]   Aerobic/Anaerobic Culture w Gram Stain (surgical/deep wound)     Status: None (Preliminary result)   Collection Time: 10/28/20 10:03 AM   Specimen: Foot  Result Value Ref Range Status   Specimen Description   Final    FOOT Performed at [place] [address], [place],  [place] [address]    Special Requests   Final    NONE Performed at [facility] [place] [address], [place], [place] [address]    Gram Stain   Final    NO WBC SEEN FEW GRAM POSITIVE COCCI Performed at [facility], [place] [address], [place], [place] [address]    Culture   Final    ABUNDANT GROUP A STREP (S.PYOGENES) ISOLATED FEW KLEBSIELLA OXYTOCA Beta hemolytic streptococci are predictably susceptible to penicillin and other beta lactams. Susceptibility testing not routinely performed. NO ANAEROBES ISOLATED; CULTURE IN PROGRESS FOR 5 DAYS    Report Status PENDING  Incomplete   Organism ID, Bacteria KLEBSIELLA OXYTOCA  Final      Susceptibility   Klebsiella oxytoca - MIC*    AMPICILLIN >=32 RESISTANT Resistant     CEFAZOLIN 8 SENSITIVE Sensitive     CEFEPIME <=0.12 SENSITIVE Sensitive     CEFTAZIDIME <=1 SENSITIVE Sensitive     CEFTRIAXONE <=0.25 SENSITIVE Sensitive     CIPROFLOXACIN <=0.25 SENSITIVE Sensitive     GENTAMICIN <=1 SENSITIVE Sensitive     IMIPENEM <=0.25 SENSITIVE Sensitive     TRIMETH/SULFA <=20 SENSITIVE Sensitive     AMPICILLIN/SULBACTAM 16 INTERMEDIATE Intermediate     PIP/TAZO <=4 SENSITIVE Sensitive     * FEW KLEBSIELLA OXYTOCA     Time coordinating discharge:  39 minutes  SIGNED:   Georgette Shell, MD  [facility] 11/01/2020, 12:12 PM

## 2020-11-01 NOTE — Progress Notes (Signed)
Inpatient Diabetes Program Recommendations  AACE/ADA: New Consensus Statement on Inpatient Glycemic Control (2015)  Target Ranges:  Prepandial:   less than 140 mg/dL      Peak postprandial:   less than 180 mg/dL (1-2 hours)      Critically ill patients:  140 - 180 mg/dL   Lab Results  Component Value Date   GLUCAP 251 (H) 11/01/2020   HGBA1C 11.2 (H) 10/28/2020    Review of Glycemic Control Results for [name] [name], [name] "HARVEY" (MRN [id number]) as of 11/01/2020 11:03  Ref. Range 10/31/2020 11:19 10/31/2020 16:46 10/31/2020 20:27 11/01/2020 07:49  Glucose-Capillary Latest Ref Range: 70 - 99 mg/dL 238 (H) 237 (H) 226 (H) 251 (H)   Diabetes history: DM2 Outpatient Diabetes medications: Invokana 300 mg QD, glipizide 5 mg QAM Current orders for Inpatient glycemic control: Lantus 10 QD, Novolog 0-15 units TID with meals + 3 units TID with meals, glipizide 5 mg BID  HgbA1C - 11.2% PCP - [facility] Glucose 205, CBGs 186, 238 mg/dL  Inpatient Diabetes Program Recommendations:     In preparation for discharge, patient is willing to perform injections.  Consider Lantus 16 units QD and continue home medications.   Spoke with patient again regarding diabetes. Patient is willing to inject insulin.  Reviewed patient's current A1c of 11.2%. Explained what a A1c is and what it measures. Also reviewed goal A1c with patient, importance of good glucose control @ home, and blood sugar goals. Reviewed patho of DM, need for insulin, current inpatient trends with current insulin needs, target goals, survival skills, hypo vs hyper glycemia, impact of glycemic control and infection, risk for poor healing, vascular changes and commorbidities. Additionally, reviewed importance of checking blood sugars with starting insulin and reviewed when to call MD and frequency.  Educated patient on insulin pen use at home. Reviewed contents of insulin flexpen starter kit. Reviewed all steps if insulin pen including attachment of  needle, 2-unit air shot, dialing up dose, giving injection, removing needle, disposal of sharps, storage of unused insulin, disposal of insulin etc. Patient able to provide successful return demonstration. Also reviewed troubleshooting with insulin pen. MD to give patient Rxs for insulin pens and insulin pen needles to be sent to [facility]. Lantus Flex [id number] Needles [phone]  RN to assist patient with self injection prior to discharge. Patient has no further questions at this time and plans to follow up with [facility].   Thanks, Bronson Curb, MSN, RNC-OB Diabetes Coordinator [phone] (8a-5p)

## 2020-11-01 NOTE — Plan of Care (Signed)

## 2020-11-01 NOTE — Progress Notes (Addendum)
Discharge instructions provided. Both IV's removed. Both catheters intact. Patient alert and oriented. Neurologically intact upon leaving. Dressing to left great toe cleansed with normal saline,  changed & rewrapped with xero-foam/curlex prior to leaving and provided with additional supplies for adequate wound care at home. Endorsed understanding of discharge instructions. Pushed down via wheelchair by [place] NA to the front of the hospital.

## 2020-11-01 NOTE — Progress Notes (Signed)
Called to room for leaking around IV site. IV site checked and flushed x 2 with normal saline. No leaking noted around site. IV re-dressed. Patient alert and oriented x 4. RR even and unlabored, dressing to left foot clean dry and intact. Medicated for pain needs per MAR. No other needs identified. Will continue to monitor.

## 2020-11-01 NOTE — TOC Progression Note (Addendum)
Transition of Care [facility]) - Progression Note    Patient Details  Name: [name] [name] MRN: [id number] Date of Birth: [birth year]  Transition of [facility]) CM/SW Contact  Purcell Mouton, RN Phone Number: 11/01/2020, 12:32 PM  Clinical Narrative:    Appointments made for pt with [facility] 3/7 at 1030AM and with [facility] 3/9 at 2:45 PM. Pt may get medications from [facility]. Pt is aware of appointments and agreed with both.    Expected Discharge Plan: Home/Self Care    Expected Discharge Plan and Services Expected Discharge Plan: Home/Self Care       Living arrangements for the past 2 months: Single Family Home Expected Discharge Date: 11/01/20                                     Social Determinants of Health (SDOH) Interventions    Readmission Risk Interventions No flowsheet data found.

## 2020-11-02 LAB — AEROBIC/ANAEROBIC CULTURE W GRAM STAIN (SURGICAL/DEEP WOUND): Gram Stain: NONE SEEN

## 2020-11-02 LAB — CULTURE, BLOOD (ROUTINE X 2)
Culture: NO GROWTH
Special Requests: ADEQUATE

## 2020-11-04 ENCOUNTER — Other Ambulatory Visit: Payer: Self-pay

## 2020-11-04 ENCOUNTER — Telehealth: Payer: Self-pay

## 2020-11-04 ENCOUNTER — Ambulatory Visit: Payer: Medicaid Other | Attending: Nurse Practitioner | Admitting: Nurse Practitioner

## 2020-11-04 ENCOUNTER — Encounter: Payer: Self-pay | Admitting: Nurse Practitioner

## 2020-11-04 ENCOUNTER — Other Ambulatory Visit: Payer: Self-pay | Admitting: Nurse Practitioner

## 2020-11-04 VITALS — BP 107/68 | HR 71 | Resp 16 | Wt 159.8 lb

## 2020-11-04 DIAGNOSIS — Z09 Encounter for follow-up examination after completed treatment for conditions other than malignant neoplasm: Secondary | ICD-10-CM | POA: Diagnosis not present

## 2020-11-04 DIAGNOSIS — IMO0002 Reserved for concepts with insufficient information to code with codable children: Secondary | ICD-10-CM

## 2020-11-04 DIAGNOSIS — E1165 Type 2 diabetes mellitus with hyperglycemia: Secondary | ICD-10-CM | POA: Diagnosis not present

## 2020-11-04 DIAGNOSIS — I10 Essential (primary) hypertension: Secondary | ICD-10-CM

## 2020-11-04 DIAGNOSIS — L089 Local infection of the skin and subcutaneous tissue, unspecified: Secondary | ICD-10-CM

## 2020-11-04 DIAGNOSIS — B9689 Other specified bacterial agents as the cause of diseases classified elsewhere: Secondary | ICD-10-CM

## 2020-11-04 DIAGNOSIS — E1140 Type 2 diabetes mellitus with diabetic neuropathy, unspecified: Secondary | ICD-10-CM

## 2020-11-04 LAB — GLUCOSE, POCT (MANUAL RESULT ENTRY): POC Glucose: 318 mg/dl — AB (ref 70–99)

## 2020-11-04 MED ORDER — LOSARTAN POTASSIUM 50 MG PO TABS: 50.0000 mg | ORAL_TABLET | Freq: Every day | ORAL | 0 refills | Status: DC

## 2020-11-04 MED ORDER — INSULIN GLARGINE 100 UNIT/ML SOLOSTAR PEN: 20.0000 [IU] | PEN_INJECTOR | Freq: Every day | SUBCUTANEOUS | 6 refills | Status: DC

## 2020-11-04 MED ORDER — MUPIROCIN 2 % EX OINT: 1.0000 "application " | TOPICAL_OINTMENT | Freq: Two times a day (BID) | CUTANEOUS | 1 refills | Status: DC

## 2020-11-04 MED ORDER — TRUEPLUS 5-BEVEL PEN NEEDLES 32G X 4 MM MISC: 6 refills | Status: DC

## 2020-11-04 MED FILL — MUPIROCIN 2% OINTMENT: 2 | 7 days supply | Qty: 44 | Fill #0

## 2020-11-04 MED FILL — LOSARTAN POTASSIUM 50 MG TA: 50 | 90 days supply | Qty: 90 | Fill #0

## 2020-11-04 NOTE — Telephone Encounter (Signed)
Transition Care Management Follow-up Telephone Call Date of discharge and from where:11/01/2020, [facility]   Patient had appointment with PCP today.

## 2020-11-04 NOTE — Progress Notes (Signed)
Assessment & Plan:  [name] [name] was seen today for hospitalization follow-up.  Diagnoses and all orders for this visit:  Hospital discharge follow-up  Uncontrolled diabetes mellitus with diabetic neuropathy ([place]) -     Cancel: POCT glucose (manual entry) -     POCT glucose (manual entry) -     insulin glargine (LANTUS) 100 UNIT/ML Solostar Pen; Inject 20 Units into the skin daily at 10 pm. -     Insulin Pen Needle (TRUEPLUS 5-BEVEL PEN NEEDLES) 32G X 4 MM MISC; Use as directed Continue blood sugar control as discussed in office today, low carbohydrate diet, and regular physical exercise as tolerated, 150 minutes per week (30 min each day, 5 days per week, or 50 min 3 days per week). Keep blood sugar logs with fasting goal of 90-130 mg/dl, post prandial (after you eat) less than 180.  For Hypoglycemia: BS <60 and Hyperglycemia BS >400; contact the clinic ASAP. Annual eye exams and foot exams are recommended.   Essential hypertension -     losartan (COZAAR) 50 MG tablet; Take 1 tablet (50 mg total) by mouth daily. Please fill as a 90 day supply Continue all antihypertensives as prescribed.  Remember to bring in your blood pressure log with you for your follow up appointment.  DASH/Mediterranean Diets are healthier choices for HTN.    Bacterial skin infection -     mupirocin ointment (BACTROBAN) 2 %; Apply 1 application topically 2 (two) times daily.    Patient has been counseled on age-appropriate routine health concerns for screening and prevention. These are reviewed and up-to-date. Referrals have been placed accordingly. Immunizations are up-to-date or declined.    Subjective:   Chief Complaint  Patient presents with  . Hospitalization Follow-up   HPI [name] [name] 60 y.o. male presents to office today for [place]. He has a past medical history of Barrett's esophagus (dx 2016), HCV, Bronchitis, Depression, Diabetes, ED, GERD,  Hepatitis C, Hiatal hernia (10/2014), and  Neuropathy.   HFU He was admitted to the hospital for 4 days and treated for severe sepsis secondary to left foot cellulitis/diabetic foot. Treated with IVFs and IV abx x3. Wound culture/Blood culture + klebsiella oxytoca. MRI negative for osteomyelitis. He was discharged home on bactrim and augmentin for 2 weeks and has a follow up appt this week with the wound care center.   DM Poorly controlled. There is a component of dietary and medication nonadherence. He is aware if we can not get his diabetes under control this will limit healing of his foot ulcer. I am increasing lantus to 20 units daily. He is also taking glipizide 5 mg daily and invokana 300 mg daily.  Lab Results  Component Value Date   HGBA1C 11.2 (H) 10/28/2020    Essential Hypertension Blood pressure is controlled. He is taking losartan 50 mg daily. Denies chest pain, shortness of breath, palpitations, lightheadedness, dizziness, headaches or BLE edema.  BP Readings from Last 3 Encounters:  11/04/20 107/68  11/01/20 139/74  09/13/20 115/72    Skin infection He has several hyperpigmented crusted lesions on his left arm and hand. The lesions seem to spread to other areas after he scratches them.    Review of Systems  Constitutional: Negative for fever, malaise/fatigue and weight loss.  HENT: Negative.  Negative for nosebleeds.   Eyes: Negative.  Negative for blurred vision, double vision and photophobia.  Respiratory: Negative.  Negative for cough and shortness of breath.   Cardiovascular: Negative.  Negative for chest  pain, palpitations and leg swelling.  Gastrointestinal: Negative.  Negative for heartburn, nausea and vomiting.  Musculoskeletal: Negative.  Negative for myalgias.  Skin: Positive for rash.  Neurological: Negative.  Negative for dizziness, focal weakness, seizures and headaches.  Psychiatric/Behavioral: Negative.  Negative for suicidal ideas.    Past Medical History:  Diagnosis Date  . Barrett's  esophagus dx 2016  . Bronchitis   . Depression   . Diabetes ([place])   . ED (erectile dysfunction)   . GERD (gastroesophageal reflux disease)   . Hepatitis C   . Hiatal hernia 10/2014   3cm  . Neuropathy     Past Surgical History:  Procedure Laterality Date  . APPENDECTOMY    . COLONOSCOPY N/A 02/16/2014   Procedure: COLONOSCOPY;  Surgeon: Gatha Mayer, MD;  Location: [facility];  Service: Endoscopy;  Laterality: N/A;  . COLONOSCOPY    . TONSILLECTOMY      Family History  Problem Relation Age of Onset  . Cancer Mother        breast  . Diabetes Mother   . Heart disease Maternal Grandfather   . Pancreatic cancer Paternal Grandmother   . Stroke Father   . Alcohol abuse Father   . Pancreatic cancer Father   . Colon cancer Neg Hx   . Stomach cancer Neg Hx   . Rectal cancer Neg Hx   . Esophageal cancer Neg Hx     Social History Reviewed with no changes to be made today.   Outpatient Medications Prior to Visit  Medication Sig Dispense Refill  . albuterol (VENTOLIN HFA) 108 (90 Base) MCG/ACT inhaler Inhale 2 puffs into the lungs every 6 (six) hours as needed for wheezing or shortness of breath. 18 g 1  . amoxicillin-clavulanate (AUGMENTIN) 875-125 MG tablet Take 1 tablet by mouth 2 (two) times daily for 14 days. 28 tablet 0  . aspirin EC 81 MG tablet Take 1 tablet (81 mg total) by mouth daily. 30 tablet 11  . atorvastatin (LIPITOR) 40 MG tablet Take 1 tablet (40 mg total) by mouth daily. Please fill as a 90 day supply. 90 tablet 2  . canagliflozin (INVOKANA) 300 MG TABS tablet Take 1 tablet (300 mg total) by mouth daily before breakfast. Please fill as a 90 day supply 90 tablet 1  . DULoxetine (CYMBALTA) 60 MG capsule Take 1 capsule (60 mg total) by mouth daily. Please fill as a 90 day supply 90 capsule 1  . EPINEPHrine 0.3 mg/0.3 mL IJ SOAJ injection Inject 0.3 mg into the muscle as needed for anaphylaxis. 2 each 0  . folic acid (FOLVITE) 1 MG tablet Take 1 tablet (1 mg  total) by mouth daily.    Marland Kitchen glipiZIDE (GLUCOTROL) 5 MG tablet Take 1 tablet (5 mg total) by mouth daily before breakfast. Please fill as a 90 day supply 90 tablet 1  . loperamide (IMODIUM A-D) 2 MG tablet Take 1 tablet (2 mg total) by mouth 4 (four) times daily as needed for diarrhea or loose stools. 60 tablet 3  . loratadine (CLARITIN) 10 MG tablet Take 1 tablet (10 mg total) by mouth daily. Please fill as a 90 day supply 90 tablet 1  . mirtazapine (REMERON) 15 MG tablet Take 1 tablet (15 mg total) by mouth at bedtime. Please fill as a 90 day supply 90 tablet 1  . Multiple Vitamins-Minerals (MULTIVITAMIN ADULT) TABS Take 1 tablet by mouth daily. 30 tablet 11  . nicotine (NICODERM CQ - DOSED IN MG/24 HOURS)  21 mg/24hr patch Place 1 patch (21 mg total) onto the skin daily. 28 patch 0  . omeprazole (PRILOSEC) 40 MG capsule Take 1 capsule (40 mg total) by mouth daily. Please fill as a 90 day supply 90 capsule 1  . sulfamethoxazole-trimethoprim (BACTRIM DS) 800-160 MG tablet Take 1 tablet by mouth 2 (two) times daily. 28 tablet 0  . insulin aspart protamine - aspart (NOVOLOG 70/30 FLEXPEN RELION) (70-30) 100 UNIT/ML FlexPen Inject 0.2 mLs (20 Units total) into the skin 2 (two) times daily. 15 mL 11  . losartan (COZAAR) 100 MG tablet Take 0.5 tablets (50 mg total) by mouth daily. Please fill as a 90 day supply 90 tablet 1   No facility-administered medications prior to visit.    Allergies  Allergen Reactions  . Bee Venom Anaphylaxis  . Lactose Intolerance (Gi) Other (See Comments)    GI upset       Objective:    BP 107/68   Pulse 71   Resp 16   Wt 159 lb 12.8 oz (72.5 kg)   SpO2 97%   BMI 22.29 kg/m  Wt Readings from Last 3 Encounters:  11/04/20 159 lb 12.8 oz (72.5 kg)  10/28/20 168 lb (76.2 kg)  05/22/20 161 lb (73 kg)    Physical Exam Vitals and nursing note reviewed.  Constitutional:      Appearance: He is well-developed.  HENT:     Head: Normocephalic and atraumatic.   Cardiovascular:     Rate and Rhythm: Normal rate and regular rhythm.     Heart sounds: Normal heart sounds. No murmur heard. No friction rub. No gallop.   Pulmonary:     Effort: Pulmonary effort is normal. No tachypnea or respiratory distress.     Breath sounds: Normal breath sounds. No decreased breath sounds, wheezing, rhonchi or rales.  Chest:     Chest wall: No tenderness.  Abdominal:     General: Bowel sounds are normal.     Palpations: Abdomen is soft.  Musculoskeletal:        General: Normal range of motion.     Cervical back: Normal range of motion.       Feet:  Feet:     Left foot:     Skin integrity: Blister, skin breakdown, erythema and warmth present.  Skin:    General: Skin is warm and dry.     Findings: Rash present. Rash is crusting and macular.  Neurological:     Mental Status: He is alert and oriented to person, place, and time.     Coordination: Coordination normal.  Psychiatric:        Behavior: Behavior normal. Behavior is cooperative.        Thought Content: Thought content normal.        Judgment: Judgment normal.          Patient has been counseled extensively about nutrition and exercise as well as the importance of adherence with medications and regular follow-up. The patient was given clear instructions to go to ER or return to medical center if symptoms don't improve, worsen or new problems develop. The patient verbalized understanding.   Follow-up: Return in about 3 months (around 02/04/2021).   [name] Pounds, FNP-BC [facility] [place], [place]   11/09/2020, 11:49 PM

## 2020-11-05 ENCOUNTER — Encounter: Payer: Self-pay | Admitting: Nurse Practitioner

## 2020-11-06 ENCOUNTER — Other Ambulatory Visit: Payer: Self-pay

## 2020-11-06 ENCOUNTER — Other Ambulatory Visit: Payer: Self-pay | Admitting: Physician Assistant

## 2020-11-06 ENCOUNTER — Encounter (HOSPITAL_BASED_OUTPATIENT_CLINIC_OR_DEPARTMENT_OTHER): Payer: Medicaid Other | Attending: Physician Assistant | Admitting: Physician Assistant

## 2020-11-06 DIAGNOSIS — E11621 Type 2 diabetes mellitus with foot ulcer: Secondary | ICD-10-CM | POA: Diagnosis not present

## 2020-11-06 DIAGNOSIS — L97522 Non-pressure chronic ulcer of other part of left foot with fat layer exposed: Secondary | ICD-10-CM | POA: Diagnosis not present

## 2020-11-06 DIAGNOSIS — E1140 Type 2 diabetes mellitus with diabetic neuropathy, unspecified: Secondary | ICD-10-CM | POA: Diagnosis not present

## 2020-11-07 MED FILL — DAKIN'S 0.25% SOLUTION: 0.25 | 1 days supply | Qty: 473 | Fill #0

## 2020-11-07 NOTE — Progress Notes (Signed)
[name] [name], RASH ([id number]) Visit Report for 11/06/2020 Abuse/Suicide Risk Screen Details Patient Name: Date of Service: [place], [name] [facility] H. 11/06/2020 2:45 PM Medical Record Number: [id number] Patient Account Number: [id number] Date of Birth/Sex: Treating RN: 1961/02/14 (60 y.o. Male) Levan Hurst Primary Care Ovida Delagarza: [name] Other Clinician: Referring Avigdor Dollar: Treating Lizandro Spellman/Extender: Dorris Singh, Zelda Weeks in Treatment: 0 Abuse/Suicide Risk Screen Items Answer ABUSE RISK SCREEN: Has anyone close to you tried to hurt or harm you recentlyo No Do you feel uncomfortable with anyone in your familyo No Has anyone forced you do things that you didnt want to doo No Electronic Signature(s) Signed: 11/07/2020 5:16:58 PM By: Levan Hurst RN, BSN Entered By: Levan Hurst on 11/06/2020 15:23:24 -------------------------------------------------------------------------------- Activities of Daily Living Details Patient Name: Date of Service: MO [place], [name] 11/06/2020 2:45 PM Medical Record Number: [id number] Patient Account Number: [id number] Date of Birth/Sex: Treating RN: 12/04/1960 (60 y.o. Male) Levan Hurst Primary Care Demetrio Leighty: [name] Other Clinician: Referring Castin Donaghue: Treating Sidonie Dexheimer/Extender: Dorris Singh, Zelda Weeks in Treatment: 0 Activities of Daily Living Items Answer Activities of Daily Living (Please select one for each item) Drive Automobile Completely Able T Medications ake Completely Able Use T elephone Completely Able Care for Appearance Completely Able Use T oilet Completely Able Bath / Shower Completely Able Dress Self Completely Able Feed Self Completely Able Walk Completely Able Get In / Out Bed Completely Able Housework Completely Able Prepare Meals Completely [place] for Self Completely Able Electronic Signature(s) Signed: 11/07/2020 5:16:58 PM By: Levan Hurst RN,  BSN Entered By: Levan Hurst on 11/06/2020 15:24:00 -------------------------------------------------------------------------------- Education Screening Details Patient Name: Date of Service: [name], [name] 11/06/2020 2:45 PM Medical Record Number: [id number] Patient Account Number: [id number] Date of Birth/Sex: Treating RN: Jun 30, 1961 (59 y.o. Male) Levan Hurst Primary Care Runette Scifres: [name] Other Clinician: Referring Andrick Rust: Treating Marios Gaiser/Extender: [place] Weeks in Treatment: 0 Primary Learner Assessed: Patient Learning Preferences/Education Level/Primary Language Learning Preference: Explanation, Demonstration, Printed Material Highest Education Level: College or Above Preferred Language: English Cognitive Barrier Language Barrier: No Translator Needed: No Memory Deficit: No Emotional Barrier: No Cultural/Religious Beliefs Affecting Medical Care: No Physical Barrier Impaired Vision: No Impaired Hearing: No Decreased Hand dexterity: No Knowledge/Comprehension Knowledge Level: High Comprehension Level: High Ability to understand written instructions: High Ability to understand verbal instructions: High Motivation Anxiety Level: Calm Cooperation: Cooperative Education Importance: Acknowledges Need Interest in Health Problems: Asks Questions Perception: Coherent Willingness to Engage in Self-Management High Activities: Readiness to Engage in Self-Management High Activities: Electronic Signature(s) Signed: 11/07/2020 5:16:58 PM By: Levan Hurst RN, BSN Entered By: Levan Hurst on 11/06/2020 15:24:21 -------------------------------------------------------------------------------- Fall Risk Assessment Details Patient Name: Date of Service: [name], [name] 11/06/2020 2:45 PM Medical Record Number: [id number] Patient Account Number: [id number] Date of Birth/Sex: Treating RN: August 08, 1961 (59 y.o. Male) Levan Hurst Primary Care Brinlyn Cena: [name] Other Clinician: Referring Grazia Taffe: Treating Sema Stangler/Extender: Dorris Singh, Zelda Weeks in Treatment: 0 Fall Risk Assessment Items Have you had 2 or more falls in the last 12 monthso 0 Yes Have you had any fall that resulted in injury in the last 12 monthso 0 No FALLS RISK SCREEN History of falling - immediate or within 3 months 0 No Secondary diagnosis (Do you have 2 or more medical diagnoseso) 15 Yes Ambulatory aid None/bed rest/wheelchair/nurse 0 Yes Crutches/cane/walker 0 No Furniture 0 No Intravenous therapy Access/Saline/Heparin Lock 0 No Gait/Transferring Normal/  bed rest/ wheelchair 0 Yes Weak (short steps with or without shuffle, stooped but able to lift head while walking, may seek 0 No support from furniture) Impaired (short steps with shuffle, may have difficulty arising from chair, head down, impaired 0 No balance) Mental Status Oriented to own ability 0 Yes Electronic Signature(s) Signed: 11/07/2020 5:16:58 PM By: Levan Hurst RN, BSN Entered By: Levan Hurst on 11/06/2020 15:24:57 -------------------------------------------------------------------------------- Foot Assessment Details Patient Name: Date of Service: [name], [name] 11/06/2020 2:45 PM Medical Record Number: [id number] Patient Account Number: [id number] Date of Birth/Sex: Treating RN: April 07, 1961 (60 y.o. Male) Levan Hurst Primary Care Shyan Scalisi: [name] Other Clinician: Referring Rohen Kimes: Treating Patryk Conant/Extender: Dorris Singh, Zelda Weeks in Treatment: 0 Foot Assessment Items Site Locations + = Sensation present, - = Sensation absent, C = Callus, U = Ulcer R = Redness, W = Warmth, M = Maceration, PU = Pre-ulcerative lesion F = Fissure, S = Swelling, D = Dryness Assessment Right: Left: Other Deformity: No No Prior Foot Ulcer: No No Prior Amputation: No No Charcot Joint: No No Ambulatory Status:  Ambulatory Without Help Gait: Steady Electronic Signature(s) Signed: 11/07/2020 5:16:58 PM By: Levan Hurst RN, BSN Entered By: Levan Hurst on 11/06/2020 15:26:18 -------------------------------------------------------------------------------- Nutrition Risk Screening Details Patient Name: Date of Service: [name], [name] 11/06/2020 2:45 PM Medical Record Number: [id number] Patient Account Number: [id number] Date of Birth/Sex: Treating RN: March 14, 1961 (59 y.o. Male) Levan Hurst Primary Care Kasper Mudrick: [name] Other Clinician: Referring Sarah Zerby: Treating Daje Stark/Extender: Dorris Singh, Zelda Weeks in Treatment: 0 Height (in): 71 Weight (lbs): 162 Body Mass Index (BMI): 22.6 Nutrition Risk Screening Items Score Screening NUTRITION RISK SCREEN: I have an illness or condition that made me change the kind and/or amount of food I eat 2 Yes I eat fewer than two meals per day 0 No I eat few fruits and vegetables, or milk products 0 No I have three or more drinks of beer, liquor or wine almost every day 0 No I have tooth or mouth problems that make it hard for me to eat 0 No I don't always have enough money to buy the food I need 0 No I eat alone most of the time 0 No I take three or more different prescribed or over-the-counter drugs a day 1 Yes Without wanting to, I have lost or gained 10 pounds in the last six months 0 No I am not always physically able to shop, cook and/or feed myself 0 No Nutrition Protocols Good Risk Protocol Moderate Risk Protocol 0 Provide education on nutrition High Risk Proctocol Risk Level: Moderate Risk Score: 3 Electronic Signature(s) Signed: 11/07/2020 5:16:58 PM By: Levan Hurst RN, BSN Entered By: Levan Hurst on 11/06/2020 15:25:11
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
## 2020-11-09 ENCOUNTER — Encounter: Payer: Self-pay | Admitting: Nurse Practitioner

## 2020-11-13 ENCOUNTER — Encounter (HOSPITAL_BASED_OUTPATIENT_CLINIC_OR_DEPARTMENT_OTHER): Payer: Medicaid Other | Admitting: Physician Assistant

## 2020-11-13 ENCOUNTER — Encounter (HOSPITAL_COMMUNITY): Payer: Self-pay

## 2020-11-13 ENCOUNTER — Other Ambulatory Visit: Payer: Self-pay

## 2020-11-13 ENCOUNTER — Inpatient Hospital Stay (HOSPITAL_COMMUNITY)
Admission: EM | Admit: 2020-11-13 | Discharge: 2020-11-26 | DRG: 617 | Disposition: A | Discharge status: Skilled Nursing Facility | Payer: Medicaid Other | Source: Ambulatory Visit | Attending: Internal Medicine | Admitting: Internal Medicine

## 2020-11-13 ENCOUNTER — Emergency Department (HOSPITAL_COMMUNITY): Payer: Medicaid Other

## 2020-11-13 DIAGNOSIS — M86272 Subacute osteomyelitis, left ankle and foot: Secondary | ICD-10-CM | POA: Diagnosis not present

## 2020-11-13 DIAGNOSIS — E11628 Type 2 diabetes mellitus with other skin complications: Secondary | ICD-10-CM | POA: Diagnosis present

## 2020-11-13 DIAGNOSIS — Z7984 Long term (current) use of oral hypoglycemic drugs: Secondary | ICD-10-CM

## 2020-11-13 DIAGNOSIS — E1169 Type 2 diabetes mellitus with other specified complication: Principal | ICD-10-CM | POA: Diagnosis present

## 2020-11-13 DIAGNOSIS — E11621 Type 2 diabetes mellitus with foot ulcer: Secondary | ICD-10-CM | POA: Diagnosis present

## 2020-11-13 DIAGNOSIS — E440 Moderate protein-calorie malnutrition: Secondary | ICD-10-CM | POA: Diagnosis present

## 2020-11-13 DIAGNOSIS — M868X7 Other osteomyelitis, ankle and foot: Secondary | ICD-10-CM | POA: Diagnosis present

## 2020-11-13 DIAGNOSIS — K2270 Barrett's esophagus without dysplasia: Secondary | ICD-10-CM | POA: Diagnosis present

## 2020-11-13 DIAGNOSIS — D649 Anemia, unspecified: Secondary | ICD-10-CM | POA: Diagnosis present

## 2020-11-13 DIAGNOSIS — K760 Fatty (change of) liver, not elsewhere classified: Secondary | ICD-10-CM | POA: Diagnosis present

## 2020-11-13 DIAGNOSIS — Z66 Do not resuscitate: Secondary | ICD-10-CM | POA: Diagnosis present

## 2020-11-13 DIAGNOSIS — L0390 Cellulitis, unspecified: Secondary | ICD-10-CM | POA: Diagnosis not present

## 2020-11-13 DIAGNOSIS — F17200 Nicotine dependence, unspecified, uncomplicated: Secondary | ICD-10-CM | POA: Diagnosis not present

## 2020-11-13 DIAGNOSIS — Z87892 Personal history of anaphylaxis: Secondary | ICD-10-CM

## 2020-11-13 DIAGNOSIS — R739 Hyperglycemia, unspecified: Secondary | ICD-10-CM | POA: Diagnosis not present

## 2020-11-13 DIAGNOSIS — E871 Hypo-osmolality and hyponatremia: Secondary | ICD-10-CM | POA: Diagnosis present

## 2020-11-13 DIAGNOSIS — Z20822 Contact with and (suspected) exposure to covid-19: Secondary | ICD-10-CM | POA: Diagnosis present

## 2020-11-13 DIAGNOSIS — Z794 Long term (current) use of insulin: Secondary | ICD-10-CM

## 2020-11-13 DIAGNOSIS — M869 Osteomyelitis, unspecified: Secondary | ICD-10-CM | POA: Diagnosis present

## 2020-11-13 DIAGNOSIS — B182 Chronic viral hepatitis C: Secondary | ICD-10-CM | POA: Diagnosis present

## 2020-11-13 DIAGNOSIS — E785 Hyperlipidemia, unspecified: Secondary | ICD-10-CM | POA: Diagnosis present

## 2020-11-13 DIAGNOSIS — K7460 Unspecified cirrhosis of liver: Secondary | ICD-10-CM | POA: Diagnosis present

## 2020-11-13 DIAGNOSIS — F32A Depression, unspecified: Secondary | ICD-10-CM | POA: Diagnosis present

## 2020-11-13 DIAGNOSIS — L97529 Non-pressure chronic ulcer of other part of left foot with unspecified severity: Secondary | ICD-10-CM | POA: Diagnosis present

## 2020-11-13 DIAGNOSIS — K219 Gastro-esophageal reflux disease without esophagitis: Secondary | ICD-10-CM | POA: Diagnosis present

## 2020-11-13 DIAGNOSIS — E1140 Type 2 diabetes mellitus with diabetic neuropathy, unspecified: Secondary | ICD-10-CM | POA: Diagnosis present

## 2020-11-13 DIAGNOSIS — E1165 Type 2 diabetes mellitus with hyperglycemia: Secondary | ICD-10-CM | POA: Diagnosis present

## 2020-11-13 DIAGNOSIS — Z89422 Acquired absence of other left toe(s): Secondary | ICD-10-CM | POA: Diagnosis not present

## 2020-11-13 DIAGNOSIS — L03119 Cellulitis of unspecified part of limb: Secondary | ICD-10-CM | POA: Diagnosis not present

## 2020-11-13 DIAGNOSIS — E739 Lactose intolerance, unspecified: Secondary | ICD-10-CM | POA: Diagnosis present

## 2020-11-13 DIAGNOSIS — F17210 Nicotine dependence, cigarettes, uncomplicated: Secondary | ICD-10-CM | POA: Diagnosis present

## 2020-11-13 DIAGNOSIS — Z6822 Body mass index (BMI) 22.0-22.9, adult: Secondary | ICD-10-CM

## 2020-11-13 DIAGNOSIS — Z8249 Family history of ischemic heart disease and other diseases of the circulatory system: Secondary | ICD-10-CM

## 2020-11-13 DIAGNOSIS — I10 Essential (primary) hypertension: Secondary | ICD-10-CM | POA: Diagnosis present

## 2020-11-13 DIAGNOSIS — Z79899 Other long term (current) drug therapy: Secondary | ICD-10-CM

## 2020-11-13 DIAGNOSIS — Z7982 Long term (current) use of aspirin: Secondary | ICD-10-CM

## 2020-11-13 DIAGNOSIS — L02612 Cutaneous abscess of left foot: Secondary | ICD-10-CM | POA: Diagnosis present

## 2020-11-13 DIAGNOSIS — Z811 Family history of alcohol abuse and dependence: Secondary | ICD-10-CM

## 2020-11-13 DIAGNOSIS — Z91030 Bee allergy status: Secondary | ICD-10-CM

## 2020-11-13 DIAGNOSIS — Z833 Family history of diabetes mellitus: Secondary | ICD-10-CM

## 2020-11-13 DIAGNOSIS — M86172 Other acute osteomyelitis, left ankle and foot: Secondary | ICD-10-CM | POA: Diagnosis not present

## 2020-11-13 DIAGNOSIS — F1010 Alcohol abuse, uncomplicated: Secondary | ICD-10-CM | POA: Diagnosis present

## 2020-11-13 DIAGNOSIS — L03116 Cellulitis of left lower limb: Secondary | ICD-10-CM | POA: Diagnosis present

## 2020-11-13 DIAGNOSIS — R609 Edema, unspecified: Secondary | ICD-10-CM | POA: Diagnosis not present

## 2020-11-13 DIAGNOSIS — L02619 Cutaneous abscess of unspecified foot: Secondary | ICD-10-CM | POA: Diagnosis not present

## 2020-11-13 LAB — CBC WITH DIFFERENTIAL/PLATELET
Abs Immature Granulocytes: 0.05 10*3/uL (ref 0.00–0.07)
Basophils Absolute: 0.1 10*3/uL (ref 0.0–0.1)
Basophils Relative: 1 %
Eosinophils Absolute: 0.6 10*3/uL — ABNORMAL HIGH (ref 0.0–0.5)
Eosinophils Relative: 5 %
HCT: 36.3 % — ABNORMAL LOW (ref 39.0–52.0)
Hemoglobin: 11.8 g/dL — ABNORMAL LOW (ref 13.0–17.0)
Immature Granulocytes: 0 %
Lymphocytes Relative: 16 %
Lymphs Abs: 2 10*3/uL (ref 0.7–4.0)
MCH: 29.8 pg (ref 26.0–34.0)
MCHC: 32.5 g/dL (ref 30.0–36.0)
MCV: 91.7 fL (ref 80.0–100.0)
Monocytes Absolute: 1.3 10*3/uL — ABNORMAL HIGH (ref 0.1–1.0)
Monocytes Relative: 10 %
Neutro Abs: 8.4 10*3/uL — ABNORMAL HIGH (ref 1.7–7.7)
Neutrophils Relative %: 68 %
Platelets: 370 10*3/uL (ref 150–400)
RBC: 3.96 MIL/uL — ABNORMAL LOW (ref 4.22–5.81)
RDW: 12.6 % (ref 11.5–15.5)
WBC: 12.4 10*3/uL — ABNORMAL HIGH (ref 4.0–10.5)
nRBC: 0 % (ref 0.0–0.2)

## 2020-11-13 LAB — COMPREHENSIVE METABOLIC PANEL
ALT: 21 U/L (ref 0–44)
AST: 27 U/L (ref 15–41)
Albumin: 3.4 g/dL — ABNORMAL LOW (ref 3.5–5.0)
Alkaline Phosphatase: 77 U/L (ref 38–126)
Anion gap: 10 (ref 5–15)
BUN: 12 mg/dL (ref 6–20)
CO2: 22 mmol/L (ref 22–32)
Calcium: 8.4 mg/dL — ABNORMAL LOW (ref 8.9–10.3)
Chloride: 95 mmol/L — ABNORMAL LOW (ref 98–111)
Creatinine, Ser: 0.75 mg/dL (ref 0.61–1.24)
GFR, Estimated: >60 mL/min (ref >60)
Glucose, Bld: 469 mg/dL — ABNORMAL HIGH (ref 70–99)
Potassium: 4 mmol/L (ref 3.5–5.1)
Sodium: 127 mmol/L — ABNORMAL LOW (ref 135–145)
Total Bilirubin: 0.6 mg/dL (ref 0.3–1.2)
Total Protein: 7.5 g/dL (ref 6.5–8.1)

## 2020-11-13 LAB — CBG MONITORING, ED
Glucose-Capillary: 480 mg/dL — ABNORMAL HIGH (ref 70–99)
Glucose-Capillary: 378 mg/dL — ABNORMAL HIGH (ref 70–99)

## 2020-11-13 LAB — LACTIC ACID, PLASMA: Lactic Acid, Venous: 2 mmol/L (ref 0.5–1.9)

## 2020-11-13 MED ORDER — HEPARIN SODIUM (PORCINE) 5000 UNIT/ML IJ SOLN
5000.0000 [IU] | Freq: Three times a day (TID) | INTRAMUSCULAR | Status: DC
  Administered 2020-11-14 – 2020-11-26 (×34): 5000 [IU] via SUBCUTANEOUS
  Filled 2020-11-13 (×35): qty 1

## 2020-11-13 MED ORDER — VANCOMYCIN HCL IN DEXTROSE 1-5 GM/200ML-% IV SOLN: 1000.0000 mg | Freq: Once | INTRAVENOUS | Status: DC

## 2020-11-13 MED ORDER — ACETAMINOPHEN 650 MG RE SUPP
650.0000 mg | Freq: Four times a day (QID) | RECTAL | Status: DC | PRN
Start: 2020-11-13 — End: 2020-11-20

## 2020-11-13 MED ORDER — LOSARTAN POTASSIUM 50 MG PO TABS
50.0000 mg | ORAL_TABLET | Freq: Every day | ORAL | Status: DC
  Administered 2020-11-14 – 2020-11-26 (×13): 50 mg via ORAL
  Filled 2020-11-13 (×13): qty 1

## 2020-11-13 MED ORDER — ALBUTEROL SULFATE HFA 108 (90 BASE) MCG/ACT IN AERS
2.0000 | INHALATION_SPRAY | Freq: Four times a day (QID) | RESPIRATORY_TRACT | Status: DC | PRN
  Filled 2020-11-13: qty 6.7

## 2020-11-13 MED ORDER — SODIUM CHLORIDE 0.9 % IV SOLN
2.0000 g | Freq: Three times a day (TID) | INTRAVENOUS | Status: DC
  Administered 2020-11-14 – 2020-11-26 (×37): 2 g via INTRAVENOUS
  Filled 2020-11-13 (×38): qty 2

## 2020-11-13 MED ORDER — INSULIN ASPART 100 UNIT/ML ~~LOC~~ SOLN
10.0000 [IU] | Freq: Once | SUBCUTANEOUS | Status: AC
  Administered 2020-11-13: 10 [IU] via SUBCUTANEOUS
  Filled 2020-11-13: qty 0.1

## 2020-11-13 MED ORDER — VANCOMYCIN HCL 1750 MG/350ML IV SOLN
1750.0000 mg | Freq: Once | INTRAVENOUS | Status: AC
  Administered 2020-11-13: 1750 mg via INTRAVENOUS
  Filled 2020-11-13: qty 350

## 2020-11-13 MED ORDER — INSULIN GLARGINE 100 UNIT/ML ~~LOC~~ SOLN
15.0000 [IU] | Freq: Every day | SUBCUTANEOUS | Status: DC
  Administered 2020-11-13 – 2020-11-15 (×3): 15 [IU] via SUBCUTANEOUS
  Filled 2020-11-13 (×5): qty 0.15

## 2020-11-13 MED ORDER — ASPIRIN EC 81 MG PO TBEC
81.0000 mg | DELAYED_RELEASE_TABLET | Freq: Every day | ORAL | Status: DC
  Administered 2020-11-14 – 2020-11-26 (×12): 81 mg via ORAL
  Filled 2020-11-13 (×12): qty 1

## 2020-11-13 MED ORDER — MIRTAZAPINE 15 MG PO TABS
15.0000 mg | ORAL_TABLET | Freq: Every day | ORAL | Status: DC
  Administered 2020-11-14 – 2020-11-25 (×13): 15 mg via ORAL
  Filled 2020-11-13 (×13): qty 1

## 2020-11-13 MED ORDER — DULOXETINE HCL 60 MG PO CPEP
60.0000 mg | ORAL_CAPSULE | Freq: Every day | ORAL | Status: DC
  Administered 2020-11-14 – 2020-11-26 (×13): 60 mg via ORAL
  Filled 2020-11-13 (×13): qty 1

## 2020-11-13 MED ORDER — ACETAMINOPHEN 325 MG PO TABS
650.0000 mg | ORAL_TABLET | Freq: Four times a day (QID) | ORAL | Status: DC | PRN
  Administered 2020-11-14 – 2020-11-21 (×3): 650 mg via ORAL
  Filled 2020-11-13 (×3): qty 2

## 2020-11-13 MED ORDER — LACTATED RINGERS IV BOLUS
1000.0000 mL | Freq: Once | INTRAVENOUS | Status: AC
  Administered 2020-11-13: 1000 mL via INTRAVENOUS

## 2020-11-13 MED ORDER — PIPERACILLIN-TAZOBACTAM 3.375 G IVPB 30 MIN
3.3750 g | Freq: Once | INTRAVENOUS | Status: AC
  Administered 2020-11-13: 3.375 g via INTRAVENOUS
  Filled 2020-11-13: qty 50

## 2020-11-13 MED ORDER — INSULIN ASPART 100 UNIT/ML ~~LOC~~ SOLN
6.0000 [IU] | Freq: Three times a day (TID) | SUBCUTANEOUS | Status: DC
  Administered 2020-11-14 – 2020-11-16 (×5): 6 [IU] via SUBCUTANEOUS
  Administered 2020-11-16: 9 [IU] via SUBCUTANEOUS
  Filled 2020-11-13: qty 0.06

## 2020-11-13 MED ORDER — VANCOMYCIN HCL IN DEXTROSE 1-5 GM/200ML-% IV SOLN
1000.0000 mg | Freq: Two times a day (BID) | INTRAVENOUS | Status: DC
  Filled 2020-11-13: qty 200

## 2020-11-13 MED ORDER — SODIUM CHLORIDE 0.9 % IV SOLN: 2.0000 g | Freq: Once | INTRAVENOUS | Status: DC

## 2020-11-13 MED ORDER — LACTATED RINGERS IV SOLN: INTRAVENOUS | Status: AC

## 2020-11-13 MED ORDER — INSULIN ASPART 100 UNIT/ML ~~LOC~~ SOLN
0.0000 [IU] | Freq: Three times a day (TID) | SUBCUTANEOUS | Status: DC
  Administered 2020-11-14: 9 [IU] via SUBCUTANEOUS
  Administered 2020-11-14: 3 [IU] via SUBCUTANEOUS
  Administered 2020-11-15 (×4): 5 [IU] via SUBCUTANEOUS
  Administered 2020-11-16: 7 [IU] via SUBCUTANEOUS
  Administered 2020-11-16: 3 [IU] via SUBCUTANEOUS
  Filled 2020-11-13: qty 0.09

## 2020-11-13 MED ORDER — ATORVASTATIN CALCIUM 40 MG PO TABS
40.0000 mg | ORAL_TABLET | Freq: Every day | ORAL | Status: DC
  Administered 2020-11-14 – 2020-11-26 (×12): 40 mg via ORAL
  Filled 2020-11-13 (×13): qty 1

## 2020-11-13 NOTE — ED Triage Notes (Signed)
Pt sent here from [facility] for evaluation of left big toe wound. Hx of DM and ulcer since 2019, in 2/22 developed a new blister/wound, was admitted, had MRI done- neg for osteomyelitis. D/c on 3/4 w abx at home. MD advised pt to come here d/t concern for necrotic tissue/osteomyelitis and infection

## 2020-11-13 NOTE — H&P (Signed)
History and Physical    [name] [name] XKG:[id number] DOB: 1960/10/09 DOA: 11/13/2020  PCP: [name], NP  Patient coming from: Home.  Chief Complaint: Left foot worsening of the wound.  HPI: [name] [name] is a 60 y.o. male with history of diabetes mellitus type 2 recently admitted for sepsis from left foot cellulitis discharged on October 31, 2020 was following up at wound center and was found to have worsening wound on the left great toe and erythema moving towards the proximal left leg and was instructed to come to the ER.  Patient has some subjective feeling of fever chills today.  Patient states he has been taking his diabetic medications.  Denies any new discharge from the wound left great toe.  ED Course: In the ER patient was having a temperature of 99 F x-rays do not show any definite signs of any osteomyelitis of the left foot.  Lab work was significant for WBC count of 12.4 lactic acid of 2 sodium 127 blood glucose of 469 Covid test was negative.  Patient was started on empiric antibiotics and fluids was given NovoLog 10 units in the ER and I have started patient on Lantus admitted for left foot cellulitis with ulceration and uncontrolled diabetes.  Review of Systems: As per HPI, rest all negative.   Past Medical History:  Diagnosis Date  . Barrett's esophagus dx 2016  . Bronchitis   . Depression   . Diabetes ([place])   . ED (erectile dysfunction)   . GERD (gastroesophageal reflux disease)   . Hepatitis C   . Hiatal hernia 10/2014   3cm  . Neuropathy     Past Surgical History:  Procedure Laterality Date  . APPENDECTOMY    . COLONOSCOPY N/A 02/16/2014   Procedure: COLONOSCOPY;  Surgeon: Gatha Mayer, MD;  Location: [facility];  Service: Endoscopy;  Laterality: N/A;  . COLONOSCOPY    . TONSILLECTOMY       reports that he has been smoking cigarettes. He has a 17.50 pack-year smoking history. He has never used smokeless tobacco. He reports previous alcohol use.  He reports that he does not use drugs.  Allergies  Allergen Reactions  . Bee Venom Anaphylaxis  . Lactose Intolerance (Gi) Other (See Comments)    GI upset    Family History  Problem Relation Age of Onset  . Cancer Mother        breast  . Diabetes Mother   . Heart disease Maternal Grandfather   . Pancreatic cancer Paternal Grandmother   . Stroke Father   . Alcohol abuse Father   . Pancreatic cancer Father   . Colon cancer Neg Hx   . Stomach cancer Neg Hx   . Rectal cancer Neg Hx   . Esophageal cancer Neg Hx     Prior to Admission medications   Medication Sig Start Date End Date Taking? Authorizing Provider  albuterol (VENTOLIN HFA) 108 (90 Base) MCG/ACT inhaler Inhale 2 puffs into the lungs every 6 (six) hours as needed for wheezing or shortness of breath. 06/16/19  Yes [name], NP  amoxicillin-clavulanate (AUGMENTIN) 875-125 MG tablet Take 1 tablet by mouth 2 (two) times daily for 14 days. Patient taking differently: Take 1 tablet by mouth 2 (two) times daily. Start date : 11/01/20 11/01/20 11/15/20 Yes Georgette Shell, MD  aspirin EC 81 MG tablet Take 1 tablet (81 mg total) by mouth daily. 04/02/17  Yes Funches, Josalyn, MD  atorvastatin (LIPITOR) 40 MG  tablet Take 1 tablet (40 mg total) by mouth daily. Please fill as a 90 day supply. 09/16/20  Yes [name], NP  canagliflozin (INVOKANA) 300 MG TABS tablet Take 1 tablet (300 mg total) by mouth daily before breakfast. Please fill as a 90 day supply Patient taking differently: Take 300 mg by mouth daily before breakfast. 09/16/20  Yes [name], NP  DULoxetine (CYMBALTA) 60 MG capsule Take 1 capsule (60 mg total) by mouth daily. Please fill as a 90 day supply Patient taking differently: Take 60 mg by mouth daily. 09/16/20 12/15/20 Yes [name], NP  EPINEPHrine 0.3 mg/0.3 mL IJ SOAJ injection Inject 0.3 mg into the muscle as needed for anaphylaxis. 06/10/20  Yes [name], NP  folic acid (FOLVITE)  1 MG tablet Take 1 tablet (1 mg total) by mouth daily. 05/21/20  Yes Eugenie Filler, MD  glipiZIDE (GLUCOTROL) 5 MG tablet Take 1 tablet (5 mg total) by mouth daily before breakfast. Please fill as a 90 day supply Patient taking differently: Take 5 mg by mouth daily before breakfast. 09/16/20 12/15/20 Yes [name], NP  insulin glargine (LANTUS) 100 UNIT/ML Solostar Pen Inject 20 Units into the skin daily at 10 pm. 11/04/20 12/04/20 Yes [name], NP  loperamide (IMODIUM A-D) 2 MG tablet Take 1 tablet (2 mg total) by mouth 4 (four) times daily as needed for diarrhea or loose stools. 06/14/20  Yes [name], NP  loratadine (CLARITIN) 10 MG tablet Take 1 tablet (10 mg total) by mouth daily. Please fill as a 90 day supply Patient taking differently: Take 10 mg by mouth daily. 09/16/20 12/15/20 Yes [name], NP  losartan (COZAAR) 50 MG tablet Take 1 tablet (50 mg total) by mouth daily. Please fill as a 90 day supply Patient taking differently: Take 50 mg by mouth daily. 11/04/20 02/02/21 Yes [name], NP  mirtazapine (REMERON) 15 MG tablet Take 1 tablet (15 mg total) by mouth at bedtime. Please fill as a 90 day supply Patient taking differently: Take 15 mg by mouth at bedtime. 09/16/20 12/15/20 Yes [name], NP  Multiple Vitamins-Minerals (MULTIVITAMIN ADULT) TABS Take 1 tablet by mouth daily. 04/02/17  Yes Funches, Josalyn, MD  mupirocin ointment (BACTROBAN) 2 % Apply 1 application topically 2 (two) times daily. Patient taking differently: Apply 1 application topically daily. 11/04/20  Yes [name], NP  nicotine (NICODERM CQ - DOSED IN MG/24 HOURS) 21 mg/24hr patch Place 1 patch (21 mg total) onto the skin daily. 11/02/20  Yes Georgette Shell, MD  omeprazole (PRILOSEC) 40 MG capsule Take 1 capsule (40 mg total) by mouth daily. Please fill as a 90 day supply Patient taking differently: Take 40 mg by mouth daily. 09/16/20 12/15/20 Yes [name], NP   sulfamethoxazole-trimethoprim (BACTRIM DS) 800-160 MG tablet Take 1 tablet by mouth 2 (two) times daily. Patient taking differently: Take 1 tablet by mouth 2 (two) times daily. Start date : 11/01/20 11/01/20  Yes Georgette Shell, MD  Insulin Pen Needle (TRUEPLUS 5-BEVEL PEN NEEDLES) 32G X 4 MM MISC Use as directed 11/04/20   [name], NP    Physical Exam: Constitutional: Moderately built and nourished. Vitals:   11/13/20 1546 11/13/20 1715 11/13/20 1830 11/13/20 2000  BP:  124/64 135/79 140/74  Pulse:  88 92 94  Resp:  16 16 16   Temp:      TempSrc:      SpO2:  98% 99% 96%  Weight: 74.4 kg     Height: 5\' 11"  (1.803 m)      Eyes: Anicteric no pallor. ENMT: No discharge from the ears eyes nose or mouth. Neck: No mass felt.  No neck rigidity. Respiratory: No rhonchi or crepitations. Cardiovascular: S1-S2 heard. Abdomen: Soft nontender bowel sounds present. Musculoskeletal: Left foot swelling and discoloration of the left great toe.  Mild bloody discharge in the ER.  Has good dorsalis pedis pulse on the left side. Skin: Erythema of the left foot and distal left leg. Neurologic: Alert awake oriented to time place and person.  Moves all extremities. Psychiatric: Appears normal.  Normal affect.   Labs on Admission: I have personally reviewed following labs and imaging studies  CBC: Recent Labs  Lab 11/13/20 1559  WBC 12.4*  NEUTROABS 8.4*  HGB 11.8*  HCT 36.3*  MCV 91.7  PLT 623   Basic Metabolic Panel: Recent Labs  Lab 11/13/20 1559  NA 127*  K 4.0  CL 95*  CO2 22  GLUCOSE 469*  BUN 12  CREATININE 0.75  CALCIUM 8.4*   GFR: Estimated Creatinine Clearance: 105.9 mL/min (by C-G formula based on SCr of 0.75 mg/dL). Liver Function Tests: Recent Labs  Lab 11/13/20 1559  AST 27  ALT 21  ALKPHOS 77  BILITOT 0.6  PROT 7.5  ALBUMIN 3.4*   No results for input(s): LIPASE, AMYLASE in the last 168 hours. No results for input(s): AMMONIA in the last 168  hours. Coagulation Profile: No results for input(s): INR, PROTIME in the last 168 hours. Cardiac Enzymes: No results for input(s): CKTOTAL, CKMB, CKMBINDEX, TROPONINI in the last 168 hours. BNP (last 3 results) No results for input(s): PROBNP in the last 8760 hours. HbA1C: No results for input(s): HGBA1C in the last 72 hours. CBG: Recent Labs  Lab 11/13/20 1818 11/13/20 1927  GLUCAP 480* 378*   Lipid Profile: No results for input(s): CHOL, HDL, LDLCALC, TRIG, CHOLHDL, LDLDIRECT in the last 72 hours. Thyroid Function Tests: No results for input(s): TSH, T4TOTAL, FREET4, T3FREE, THYROIDAB in the last 72 hours. Anemia Panel: No results for input(s): VITAMINB12, FOLATE, FERRITIN, TIBC, IRON, RETICCTPCT in the last 72 hours. Urine analysis:    Component Value Date/Time   COLORURINE YELLOW 10/28/2020 1014   APPEARANCEUR CLEAR 10/28/2020 1014   LABSPEC 1.026 10/28/2020 1014   PHURINE 5.0 10/28/2020 1014   GLUCOSEU >=500 (A) 10/28/2020 1014   HGBUR SMALL (A) 10/28/2020 1014   BILIRUBINUR NEGATIVE 10/28/2020 1014   BILIRUBINUR neg 08/03/2017 0959   KETONESUR 5 (A) 10/28/2020 1014   PROTEINUR NEGATIVE 10/28/2020 1014   UROBILINOGEN 0.2 08/03/2017 0959   UROBILINOGEN 0.2 09/28/2013 1520   NITRITE NEGATIVE 10/28/2020 1014   LEUKOCYTESUR NEGATIVE 10/28/2020 1014   Sepsis Labs: @LABRCNTIP (procalcitonin:4,lacticidven:4) )No results found for this or any previous visit (from the past 240 hour(s)).   Radiological Exams on Admission: DG Foot 2 Views Left  Result Date: 11/13/2020 CLINICAL DATA:  Left first toe wound. EXAM: LEFT FOOT - 2 VIEW COMPARISON:  October 28, 2020. FINDINGS: There is no evidence of fracture or dislocation. There is no evidence of arthropathy or other focal bone abnormality. No lytic destruction is seen to suggest osteomyelitis. There appears to be a large ulceration in the vicinity of the first proximal phalanx. IMPRESSION: Large ulceration is seen in the vicinity  of the first proximal phalanx. No lytic destruction is seen to suggest osteomyelitis. Electronically Signed   By: Bobbe Medico.D.  On: 11/13/2020 16:47     Assessment/Plan Principal Problem:   Cellulitis of lower extremity Active Problems:   Chronic hepatitis C without hepatic coma (HCC)   Hepatic cirrhosis (HCC)   Type 2 diabetes mellitus with hyperlipidemia (HCC)   Essential hypertension   Cellulitis    1. Cellulitis of the left foot and left leg with chronic ulceration of the left great toe with necrotic appearing lesions -we will keep patient on empiric antibiotics check MRI of the left foot and left leg.  Check sed rate and based on the MRI will have further plans.  Has good pulses on exam.  We will also check Dopplers of the left lower extremity. 2. Diabetes mellitus type 2 uncontrolled with last hemoglobin A1c about 2 weeks ago was 11.2.  Patient states he has been taking his NovoLog 70/30.  We will keep patient on Lantus for now and sliding scale coverage with Premeal insulin coverage.  Closely follow CBGs. 3. Hyponatremia likely from hyperglycemia which I think will improve with hydration and correction of blood sugar.  Closely follow metabolic panel. 4. Hypertension on Cozaar. 5. History of cirrhosis of liver appears compensated. 6. Chronic anemia follow CBC. 7. Depression on Cymbalta and mirtazapine. 8. Previous history of alcohol abuse patient states he has not had any alcohol for the last 6 months.  Given that patient has significant cellulitis with ulceration of the foot will need close monitoring and further management and inpatient status.   DVT prophylaxis: Heparin. Code Status: Full code. Family Communication: Discussed with patient. Disposition Plan: Home. Consults called: Wound team. Admission status: Inpatient.   Rise Patience MD [facility] Pager [phone].  If 7PM-7AM, please contact night-coverage [email or web address] Password  [facility]  11/13/2020, 9:28 PM
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
## 2020-11-13 NOTE — ED Provider Notes (Signed)
[facility] DEPT Provider Note   CSN: [id number] Arrival date & time: 11/13/20  1531     History Chief Complaint  Patient presents with  . Wound Infection    [name] [name] is a 60 y.o. male.  Patient is being seen at wound care center for toe ulcer. Patient is a diabetic.  Ulceration seems to be worsening,  with new onset blue-green drainage of wound and distal erythema of lower leg noted at wound care appointment today. Patient has prior work-up and hospitalization for sepsis in late February. MRI performed during that visit did not reveal osteomyelitis.    Wound Check This is a recurrent problem. The current episode started more than 1 week ago. The treatment provided no relief.       Past Medical History:  Diagnosis Date  . Barrett's esophagus dx 2016  . Bronchitis   . Depression   . Diabetes ([place])   . ED (erectile dysfunction)   . GERD (gastroesophageal reflux disease)   . Hepatitis C   . Hiatal hernia 10/2014   3cm  . Neuropathy     Patient Active Problem List   Diagnosis Date Noted  . Sepsis ([place]) 10/28/2020  . Cellulitis in diabetic foot ([place]) 10/28/2020  . Community acquired pneumonia of right lower lobe of lung   . Elevated LFTs   . Hypophosphatemia   . Acute metabolic encephalopathy   . Acute maxillary sinusitis   . Depression   . Physical deconditioning   . Pneumonia 05/14/2020  . DTs (delirium tremens) ([place]) 05/13/2020  . Hyponatremia 05/13/2020  . Hypokalemia 05/13/2020  . PNA (pneumonia) 05/13/2020  . SIRS (systemic inflammatory response syndrome) ([place]) 05/13/2020  . Sepsis with acute hypoxic respiratory failure without septic shock ([place])   . Alcohol abuse with alcohol-induced mood disorder ([place]) 12/17/2017  . Hereditary hemochromatosis ([place]) 11/23/2017  . 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 Essential hypertension 04/02/2017  . Hypogonadism male 08/13/2016  . ETOH abuse 06/22/2016  .  Low serum testosterone level 01/08/2016  . Type 2 diabetes mellitus with hyperlipidemia ([place]) 01/02/2016  . Erectile dysfunction 09/12/2015  . Hepatic cirrhosis ([place]) 01/10/2015  . Chronic hepatitis C without hepatic coma ([place]) 10/31/2014  . Tobacco use disorder 08/03/2013    Past Surgical History:  Procedure Laterality Date  . APPENDECTOMY    . COLONOSCOPY N/A 02/16/2014   Procedure: COLONOSCOPY;  Surgeon: Gatha Mayer, MD;  Location: [facility] ENDOSCOPY;  Service: Endoscopy;  Laterality: N/A;  . COLONOSCOPY    . TONSILLECTOMY         Family History  Problem Relation Age of Onset  . Cancer Mother        breast  . Diabetes Mother   . Heart disease Maternal Grandfather   . Pancreatic cancer Paternal Grandmother   . Stroke Father   . Alcohol abuse Father   . Pancreatic cancer Father   . Colon cancer Neg Hx   . Stomach cancer Neg Hx   . Rectal cancer Neg Hx   . Esophageal cancer Neg Hx     Social History   Tobacco Use  . Smoking status: Current Every Day Smoker    Packs/day: 0.50    Years: 35.00    Pack years: 17.50    Types: Cigarettes  . Smokeless tobacco: Never Used  . Tobacco comment: none x 3 weeks as of 05/22/2020  Vaping Use  . Vaping Use: Never used  Substance Use Topics  . Alcohol  use: Not Currently    Comment: daily  . Drug use: No    Home Medications Prior to Admission medications   Medication Sig Start Date End Date Taking? Authorizing Provider  albuterol (VENTOLIN HFA) 108 (90 Base) MCG/ACT inhaler Inhale 2 puffs into the lungs every 6 (six) hours as needed for wheezing or shortness of breath. 06/16/19   [name], NP  amoxicillin-clavulanate (AUGMENTIN) 875-125 MG tablet Take 1 tablet by mouth 2 (two) times daily for 14 days. 11/01/20 11/15/20  Georgette Shell, MD  aspirin EC 81 MG tablet Take 1 tablet (81 mg total) by mouth daily. 04/02/17   Funches, Adriana Mccallum, MD  atorvastatin (LIPITOR) 40 MG tablet Take 1 tablet (40 mg total) by mouth daily.  Please fill as a 90 day supply. 09/16/20   [name], NP  canagliflozin (INVOKANA) 300 MG TABS tablet Take 1 tablet (300 mg total) by mouth daily before breakfast. Please fill as a 90 day supply 09/16/20   [name], NP  DULoxetine (CYMBALTA) 60 MG capsule Take 1 capsule (60 mg total) by mouth daily. Please fill as a 90 day supply 09/16/20 12/15/20  [name], NP  EPINEPHrine 0.3 mg/0.3 mL IJ SOAJ injection Inject 0.3 mg into the muscle as needed for anaphylaxis. 06/10/20   [name], NP  folic acid (FOLVITE) 1 MG tablet Take 1 tablet (1 mg total) by mouth daily. 05/21/20   Eugenie Filler, MD  glipiZIDE (GLUCOTROL) 5 MG tablet Take 1 tablet (5 mg total) by mouth daily before breakfast. Please fill as a 90 day supply 09/16/20 12/15/20  [name], NP  insulin glargine (LANTUS) 100 UNIT/ML Solostar Pen Inject 20 Units into the skin daily at 10 pm. 11/04/20 12/04/20  [name], NP  Insulin Pen Needle (TRUEPLUS 5-BEVEL PEN NEEDLES) 32G X 4 MM MISC Use as directed 11/04/20   [name], NP  loperamide (IMODIUM A-D) 2 MG tablet Take 1 tablet (2 mg total) by mouth 4 (four) times daily as needed for diarrhea or loose stools. 06/14/20   [name], NP  loratadine (CLARITIN) 10 MG tablet Take 1 tablet (10 mg total) by mouth daily. Please fill as a 90 day supply 09/16/20 12/15/20  [name], NP  losartan (COZAAR) 50 MG tablet Take 1 tablet (50 mg total) by mouth daily. Please fill as a 90 day supply 11/04/20 02/02/21  [name], NP  mirtazapine (REMERON) 15 MG tablet Take 1 tablet (15 mg total) by mouth at bedtime. Please fill as a 90 day supply 09/16/20 12/15/20  [name], NP  Multiple Vitamins-Minerals (MULTIVITAMIN ADULT) TABS Take 1 tablet by mouth daily. 04/02/17   Funches, Adriana Mccallum, MD  mupirocin ointment (BACTROBAN) 2 % Apply 1 application topically 2 (two) times daily. 11/04/20   [name], NP  nicotine (NICODERM CQ - DOSED IN MG/24 HOURS) 21  mg/24hr patch Place 1 patch (21 mg total) onto the skin daily. 11/02/20   Georgette Shell, MD  omeprazole (PRILOSEC) 40 MG capsule Take 1 capsule (40 mg total) by mouth daily. Please fill as a 90 day supply 09/16/20 12/15/20  [name], NP  sulfamethoxazole-trimethoprim (BACTRIM DS) 800-160 MG tablet Take 1 tablet by mouth 2 (two) times daily. 11/01/20   Georgette Shell, MD    Allergies    Bee venom and Lactose intolerance (gi)  Review of Systems   Review of Systems  Skin: Positive for wound.  All other systems reviewed and are negative.   Physical Exam Updated Vital Signs BP 124/64   Pulse 88   Temp 99.1 F (37.3 C) (Oral)   Resp 16   Ht 5\' 11"  (1.803 m)   Wt 74.4 kg   SpO2 98%   BMI 22.87 kg/m   Physical Exam Vitals and nursing note reviewed.  HENT:     Head: Normocephalic.     Mouth/Throat:     Mouth: Mucous membranes are moist.  Eyes:     Conjunctiva/sclera: Conjunctivae normal.  Cardiovascular:     Rate and Rhythm: Normal rate.  Pulmonary:     Effort: Pulmonary effort is normal.  Abdominal:     Palpations: Abdomen is soft.  Musculoskeletal:        General: Tenderness present.  Skin:    General: Skin is warm.     Findings: Erythema present.  Neurological:     Mental Status: He is alert and oriented to person, place, and time.  Psychiatric:        Mood and Affect: Mood normal.       ED Results / Procedures / Treatments   Labs (all labs ordered are listed, but only abnormal results are displayed) Labs Reviewed  CBC WITH DIFFERENTIAL/PLATELET - Abnormal; Notable for the following components:      Result Value   WBC 12.4 (*)    RBC 3.96 (*)    Hemoglobin 11.8 (*)    HCT 36.3 (*)    Neutro Abs 8.4 (*)    Monocytes Absolute 1.3 (*)    Eosinophils Absolute 0.6 (*)    All other components within normal limits  COMPREHENSIVE METABOLIC PANEL - Abnormal; Notable for the following components:   Sodium 127 (*)    Chloride 95 (*)    Glucose,  Bld 469 (*)    Calcium 8.4 (*)    Albumin 3.4 (*)    All other components within normal limits  LACTIC ACID, PLASMA - Abnormal; Notable for the following components:   Lactic Acid, Venous 2.0 (*)    All other components within normal limits  CBG MONITORING, ED - Abnormal; Notable for the following components:   Glucose-Capillary 480 (*)    All other components within normal limits    EKG None  Radiology DG Foot 2 Views Left  Result Date: 11/13/2020 CLINICAL DATA:  Left first toe wound. EXAM: LEFT FOOT - 2 VIEW COMPARISON:  October 28, 2020. FINDINGS: There is no evidence of fracture or dislocation. There is no evidence of arthropathy or other focal bone abnormality. No lytic destruction is seen to suggest osteomyelitis. There appears to be a large ulceration in the vicinity of the first proximal phalanx. IMPRESSION: Large ulceration is seen in the vicinity of the first proximal phalanx. No lytic destruction is seen to suggest osteomyelitis. Electronically Signed   By: Marijo Conception M.D.   On: 11/13/2020 16:47    Procedures Procedures   Medications Ordered in ED Medications  piperacillin-tazobactam (ZOSYN) IVPB 3.375 g (has no administration in time range)  lactated ringers bolus 1,000 mL (1,000 mLs Intravenous New Bag/Given 11/13/20 1738)  insulin aspart (novoLOG) injection 10 Units (10 Units Subcutaneous Given 11/13/20 1820)    ED Course  I have reviewed the triage vital signs and the nursing notes.  Pertinent labs & imaging results that were available during my care of the patient were reviewed by me and considered in my medical decision making (see chart for details).  MDM Rules/Calculators/A&P                          Patient presented to ED from wound care center. Patient with ulceration of left great toe. Area has developed blueish-green drainage and erythema extending from the foot to the mid-calf. Patient is diabetic and hyperglycemic. IV fluids initiated, SQ  insulin given. Patient started on zosyn. Will ask hospitalist team to admit.   Final Clinical Impression(s) / ED Diagnoses Final diagnoses:  Cellulitis of left foot  Hyperglycemia    Rx / DC Orders ED Discharge Orders    None       Etta Quill, NP 11/13/20 2227    Daleen Bo, MD 11/14/20 [phone]

## 2020-11-13 NOTE — Progress Notes (Signed)
Pharmacy Antibiotic Note  [name] [name] is a 60 y.o. male admitted on 11/13/2020 with cellulitis.  Pharmacy has been consulted for Cefepime & Vancomycin dosing.  Currently on oral Bactrim + Augmentin with worsening ulcer.  Imaging negative for osteo.   Renal function at patient's baseline.   Plan: Cefepime 2gm IV q8h Vancomycin 1750mg  IV x1 then 1gm IV q12h to target AUC 400-550 Check Vancomycin level at steady state Monitor renal function and cx data   Height: 5\' 11"  (180.3 cm) Weight: 74.4 kg (164 lb) IBW/kg (Calculated) : 75.3  Temp (24hrs), Avg:99.1 F (37.3 C), Min:99.1 F (37.3 C), Max:99.1 F (37.3 C)  Recent Labs  Lab 11/13/20 1559 11/13/20 1738  WBC 12.4*  --   CREATININE 0.75  --   LATICACIDVEN  --  2.0*    Estimated Creatinine Clearance: 105.9 mL/min (by C-G formula based on SCr of 0.75 mg/dL).    Allergies  Allergen Reactions  . Bee Venom Anaphylaxis  . Lactose Intolerance (Gi) Other (See Comments)    GI upset    Antimicrobials this admission: 3/16 Zosyn x1 3/16 Vancomycin >>  3/17 Cefepime >>   Dose adjustments this admission:  Microbiology results:  2/18 Deep surgical wound (foot): Klebsiella oxytoca- sensitive except ampicillin/unasyn  Thank you for allowing pharmacy to be a part of this patient's care.  Netta Cedars PharmD 11/13/2020 10:12 PM

## 2020-11-14 ENCOUNTER — Inpatient Hospital Stay (HOSPITAL_COMMUNITY): Payer: Medicaid Other

## 2020-11-14 ENCOUNTER — Other Ambulatory Visit: Payer: Self-pay | Admitting: Physician Assistant

## 2020-11-14 DIAGNOSIS — L02612 Cutaneous abscess of left foot: Secondary | ICD-10-CM | POA: Diagnosis present

## 2020-11-14 DIAGNOSIS — M869 Osteomyelitis, unspecified: Secondary | ICD-10-CM | POA: Diagnosis present

## 2020-11-14 DIAGNOSIS — L02619 Cutaneous abscess of unspecified foot: Secondary | ICD-10-CM

## 2020-11-14 DIAGNOSIS — R609 Edema, unspecified: Secondary | ICD-10-CM

## 2020-11-14 DIAGNOSIS — M86172 Other acute osteomyelitis, left ankle and foot: Secondary | ICD-10-CM

## 2020-11-14 DIAGNOSIS — L0390 Cellulitis, unspecified: Secondary | ICD-10-CM

## 2020-11-14 LAB — COMPREHENSIVE METABOLIC PANEL
ALT: 16 U/L (ref 0–44)
AST: 21 U/L (ref 15–41)
Albumin: 2.8 g/dL — ABNORMAL LOW (ref 3.5–5.0)
Alkaline Phosphatase: 66 U/L (ref 38–126)
Anion gap: 9 (ref 5–15)
BUN: 11 mg/dL (ref 6–20)
CO2: 20 mmol/L — ABNORMAL LOW (ref 22–32)
Calcium: 8.2 mg/dL — ABNORMAL LOW (ref 8.9–10.3)
Chloride: 103 mmol/L (ref 98–111)
Creatinine, Ser: 0.57 mg/dL — ABNORMAL LOW (ref 0.61–1.24)
GFR, Estimated: >60 mL/min (ref >60)
Glucose, Bld: 314 mg/dL — ABNORMAL HIGH (ref 70–99)
Potassium: 3.9 mmol/L (ref 3.5–5.1)
Sodium: 132 mmol/L — ABNORMAL LOW (ref 135–145)
Total Bilirubin: 0.5 mg/dL (ref 0.3–1.2)
Total Protein: 6.6 g/dL (ref 6.5–8.1)

## 2020-11-14 LAB — CBC WITH DIFFERENTIAL/PLATELET
Abs Immature Granulocytes: 0.06 10*3/uL (ref 0.00–0.07)
Basophils Absolute: 0.1 10*3/uL (ref 0.0–0.1)
Basophils Relative: 1 %
Eosinophils Absolute: 1 10*3/uL — ABNORMAL HIGH (ref 0.0–0.5)
Eosinophils Relative: 7 %
HCT: 35.4 % — ABNORMAL LOW (ref 39.0–52.0)
Hemoglobin: 11.5 g/dL — ABNORMAL LOW (ref 13.0–17.0)
Immature Granulocytes: 0 %
Lymphocytes Relative: 21 %
Lymphs Abs: 2.8 10*3/uL (ref 0.7–4.0)
MCH: 29.8 pg (ref 26.0–34.0)
MCHC: 32.5 g/dL (ref 30.0–36.0)
MCV: 91.7 fL (ref 80.0–100.0)
Monocytes Absolute: 1.6 10*3/uL — ABNORMAL HIGH (ref 0.1–1.0)
Monocytes Relative: 11 %
Neutro Abs: 8 10*3/uL — ABNORMAL HIGH (ref 1.7–7.7)
Neutrophils Relative %: 60 %
Platelets: 336 10*3/uL (ref 150–400)
RBC: 3.86 MIL/uL — ABNORMAL LOW (ref 4.22–5.81)
RDW: 12.6 % (ref 11.5–15.5)
WBC: 13.6 10*3/uL — ABNORMAL HIGH (ref 4.0–10.5)
nRBC: 0 % (ref 0.0–0.2)

## 2020-11-14 LAB — GLUCOSE, CAPILLARY
Glucose-Capillary: 205 mg/dL — ABNORMAL HIGH (ref 70–99)
Glucose-Capillary: 379 mg/dL — ABNORMAL HIGH (ref 70–99)
Glucose-Capillary: 223 mg/dL — ABNORMAL HIGH (ref 70–99)

## 2020-11-14 LAB — SARS CORONAVIRUS 2 (TAT 6-24 HRS): SARS Coronavirus 2: NEGATIVE

## 2020-11-14 LAB — SEDIMENTATION RATE: Sed Rate: 77 mm/hr — ABNORMAL HIGH (ref 0–16)

## 2020-11-14 MED ORDER — NICOTINE 7 MG/24HR TD PT24
7.0000 mg | MEDICATED_PATCH | Freq: Every day | TRANSDERMAL | Status: DC
  Administered 2020-11-14 – 2020-11-26 (×12): 7 mg via TRANSDERMAL
  Filled 2020-11-14 (×12): qty 1

## 2020-11-14 MED ORDER — ADULT MULTIVITAMIN W/MINERALS CH
1.0000 | ORAL_TABLET | Freq: Every day | ORAL | Status: DC
  Administered 2020-11-16 – 2020-11-26 (×11): 1 via ORAL
  Filled 2020-11-14 (×11): qty 1

## 2020-11-14 MED ORDER — METRONIDAZOLE 500 MG PO TABS
500.0000 mg | ORAL_TABLET | Freq: Three times a day (TID) | ORAL | Status: DC
  Administered 2020-11-14 – 2020-11-16 (×7): 500 mg via ORAL
  Filled 2020-11-14 (×7): qty 1

## 2020-11-14 MED ORDER — VANCOMYCIN HCL 1250 MG/250ML IV SOLN
1250.0000 mg | Freq: Two times a day (BID) | INTRAVENOUS | Status: DC
  Administered 2020-11-14 – 2020-11-19 (×10): 1250 mg via INTRAVENOUS
  Filled 2020-11-14 (×13): qty 250

## 2020-11-14 MED ORDER — FOLIC ACID 1 MG PO TABS
1.0000 mg | ORAL_TABLET | Freq: Every day | ORAL | Status: DC
  Administered 2020-11-16 – 2020-11-26 (×11): 1 mg via ORAL
  Filled 2020-11-14 (×11): qty 1

## 2020-11-14 MED ORDER — CEFAZOLIN SODIUM-DEXTROSE 2-4 GM/100ML-% IV SOLN
2.0000 g | INTRAVENOUS | Status: AC
  Administered 2020-11-15: 2 g via INTRAVENOUS
  Filled 2020-11-14: qty 100

## 2020-11-14 NOTE — TOC Initial Note (Signed)
Transition of Care [facility]) - Initial/Assessment Note   Patient Details  Name: [name] [name] MRN: [id number] Date of Birth: 1961-01-20  [facility] of [facility]) CM/SW Contact:    Sherie Don, LCSW Phone Number: 11/14/2020, 10:13 AM  Clinical Narrative: Patient is a 60 year old male who was admitted for cellulitis of lower extremity. Readmission checklist completed due to high readmission score.  CSW met with patient to complete assessment. Per patient, he resides in a boarding house. Current DME includes a walker. Patient does not have a history with [place] services and is independent with ADLs/self-care at baseline. Patient uses the bus, friends, and his mother for transportation to medical appointments. Patient reported he is usually able to afford his medications and is applying for the [facility] Card to assist with the cost of medications. Patient's PCP is [facility]. He in the process of submitting his Medicaid application.  Expected Discharge Plan: Home/Self Care Barriers to Discharge: Continued Medical Work up  Patient Goals and CMS Choice Choice offered to / list presented to : Patient  Expected Discharge Plan and Services Expected Discharge Plan: Home/Self Care In-house Referral: Clinical Social Work Living arrangements for the past 2 months: [place]  Prior Living Arrangements/Services Living arrangements for the past 2 months: [employer] Patient language and need for interpreter reviewed:: Yes Do you feel safe going back to the place where you live?: Yes      Need for Family Participation in Patient Care: No (Comment) Care giver support system in place?: Yes (comment) Current home services: DME [facility]) Criminal Activity/Legal Involvement Pertinent to Current Situation/Hospitalization: No - Comment as needed  Activities of Daily Living Home Assistive Devices/Equipment: Eyeglasses,CBG Meter,Cane (specify quad or straight),Walker (specify type) (single  point cane, 4 wheeled walker) ADL Screening (condition at time of admission) Patient's cognitive ability adequate to safely complete daily activities?: Yes Is the patient deaf or have difficulty hearing?: No Does the patient have difficulty seeing, even when wearing glasses/contacts?: No Does the patient have difficulty concentrating, remembering, or making decisions?: No Patient able to express need for assistance with ADLs?: Yes Does the patient have difficulty dressing or bathing?: No Independently performs ADLs?: No Communication: Independent Dressing (OT): Independent Grooming: Independent Feeding: Independent Bathing: Independent Toileting: Needs assistance Is this a change from baseline?: Pre-admission baseline In/Out Bed: Needs assistance Is this a change from baseline?: Pre-admission baseline Walks in Home: Needs assistance Is this a change from baseline?: Change from baseline, expected to last >3 days Does the patient have difficulty walking or climbing stairs?: Yes (secondary to back pain and left foot pain) Weakness of Legs: Left Weakness of Arms/Hands: None  Emotional Assessment Appearance:: Appears stated age Attitude/Demeanor/Rapport: Engaged Affect (typically observed): Accepting Orientation: : Oriented to Self,Oriented to Place,Oriented to  Time,Oriented to Situation Alcohol / Substance Use: Tobacco Use  Admission diagnosis:  Cellulitis [L03.90] Hyperglycemia [R73.9] Cellulitis of left foot [L03.116] Patient Active Problem List   Diagnosis Date Noted  . Cellulitis of lower extremity 11/13/2020  . Cellulitis 11/13/2020  . Sepsis ([place]) 10/28/2020  . Cellulitis in diabetic foot ([place]) 10/28/2020  . Community acquired pneumonia of right lower lobe of lung   . Elevated LFTs   . Hypophosphatemia   . Acute metabolic encephalopathy   . Acute maxillary sinusitis   . Depression   . Physical deconditioning   . Pneumonia 05/14/2020  . DTs (delirium tremens) ([place])  05/13/2020  . Hyponatremia 05/13/2020  . Hypokalemia 05/13/2020  . PNA (pneumonia) 05/13/2020  .  SIRS (systemic inflammatory response syndrome) ([place]) 05/13/2020  . Sepsis with acute hypoxic respiratory failure without septic shock ([place])   . Alcohol abuse with alcohol-induced mood disorder ([place]) 12/17/2017  . Hereditary hemochromatosis ([place]) 11/23/2017  . 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 Essential hypertension 04/02/2017  . Hypogonadism male 08/13/2016  . ETOH abuse 06/22/2016  . Low serum testosterone level 01/08/2016  . Type 2 diabetes mellitus with hyperlipidemia ([place]) 01/02/2016  . Erectile dysfunction 09/12/2015  . Hepatic cirrhosis ([place]) 01/10/2015  . Chronic hepatitis C without hepatic coma ([place]) 10/31/2014  . Tobacco use disorder 08/03/2013   PCP:  [name], NP Pharmacy:   [facility], [address] [place] [address] Phone: [phone] Fax: [phone]  Readmission Risk Interventions Readmission Risk Prevention Plan 11/14/2020  Transportation Screening Complete  HRI or Home Care Consult Complete  Social Work Consult for [place] Planning/Counseling Complete  Palliative Care Screening Not Applicable  Medication Review [employer]) Complete  Some recent data might be hidden
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
## 2020-11-14 NOTE — Progress Notes (Incomplete)
PROGRESS NOTE    [name] [name]  GOT:[id number] DOB: Nov 17, 1960 DOA: 11/13/2020 PCP: [name], NP   Brief Narrative: [name] [name] is a 59 y.o. ***   Assessment & Plan:   Principal Problem:   Cellulitis of lower extremity Active Problems:   Chronic hepatitis C without hepatic coma (HCC)   Hepatic cirrhosis (HCC)   Type 2 diabetes mellitus with hyperlipidemia (HCC)   Essential hypertension   Cellulitis   ***  -----     DVT prophylaxis: *** Code Status:   Code Status: DNR Family Communication: *** Disposition Plan: ***   Consultants:   ***  Procedures:   ***  Antimicrobials:  ***    Subjective: ***  Objective: Vitals:   11/14/20 0057 11/14/20 0323 11/14/20 0545 11/14/20 0902  BP: 138/78 (!) 114/59 115/74 128/71  Pulse: 80 74 77 78  Resp: 18 17 18 16   Temp: 99.1 F (37.3 C) 98.4 F (36.9 C) 98.4 F (36.9 C) 98.2 F (36.8 C)  TempSrc: Oral Oral Oral Oral  SpO2: 98% 97% 97% 100%  Weight: 74.5 kg     Height: 5\' 11"  (1.803 m)       Intake/Output Summary (Last 24 hours) at 11/14/2020 1325 Last data filed at 11/14/2020 1310 Gross per 24 hour  Intake 9102.97 ml  Output 1850 ml  Net 7252.97 ml   Filed Weights   11/13/20 1546 11/14/20 0057  Weight: 74.4 kg 74.5 kg    Examination:  General exam: Appears calm and comfortable *** Respiratory system: Clear to auscultation. Respiratory effort normal. Cardiovascular system: S1 & S2 heard, RRR. No murmurs, rubs, gallops or clicks. Gastrointestinal system: Abdomen is nondistended, soft and nontender. No organomegaly or masses felt. Normal bowel sounds heard. Central nervous system: Alert and oriented. No focal neurological deficits. Musculoskeletal: No edema. No calf tenderness Skin: No cyanosis. No rashes Psychiatry: Judgement and insight appear normal. Mood & affect appropriate.     Data Reviewed: I have personally reviewed following labs and imaging studies  CBC Lab Results   Component Value Date   WBC 13.6 (H) 11/14/2020   RBC 3.86 (L) 11/14/2020   HGB 11.5 (L) 11/14/2020   HCT 35.4 (L) 11/14/2020   MCV 91.7 11/14/2020   MCH 29.8 11/14/2020   PLT 336 11/14/2020   MCHC 32.5 11/14/2020   RDW 12.6 11/14/2020   LYMPHSABS 2.8 11/14/2020   MONOABS 1.6 (H) 11/14/2020   EOSABS 1.0 (H) 11/14/2020   BASOSABS 0.1 59/74/1638     Last metabolic panel Lab Results  Component Value Date   NA 132 (L) 11/14/2020   K 3.9 11/14/2020   CL 103 11/14/2020   CO2 20 (L) 11/14/2020   BUN 11 11/14/2020   CREATININE 0.57 (L) 11/14/2020   GLUCOSE 314 (H) 11/14/2020   GFRNONAA >60 11/14/2020   GFRAA 116 09/23/2020   CALCIUM 8.2 (L) 11/14/2020   PHOS 2.6 05/18/2020   PROT 6.6 11/14/2020   ALBUMIN 2.8 (L) 11/14/2020   LABGLOB 3.0 09/23/2020   AGRATIO 1.4 09/23/2020   BILITOT 0.5 11/14/2020   ALKPHOS 66 11/14/2020   AST 21 11/14/2020   ALT 16 11/14/2020   ANIONGAP 9 11/14/2020    CBG (last 3)  Recent Labs    11/13/20 1927 11/14/20 0835 11/14/20 1256  GLUCAP 378* 205* 379*     GFR: Estimated Creatinine Clearance: 106.1 mL/min (A) (by C-G formula based on SCr of 0.57 mg/dL (L)).  Coagulation Profile: No results for input(s): INR, PROTIME in  the last 168 hours.  Recent Results (from the past 240 hour(s))  SARS CORONAVIRUS 2 (TAT 6-24 HRS) Nasopharyngeal Nasopharyngeal Swab     Status: None   Collection Time: 11/13/20  9:37 PM   Specimen: Nasopharyngeal Swab  Result Value Ref Range Status   SARS Coronavirus 2 NEGATIVE NEGATIVE Final    Comment: (NOTE) SARS-CoV-2 target nucleic acids are NOT DETECTED.  The SARS-CoV-2 RNA is generally detectable in upper and lower respiratory specimens during the acute phase of infection. Negative results do not preclude SARS-CoV-2 infection, do not rule out co-infections with other pathogens, and should not be used as the sole basis for treatment or other patient management decisions. Negative results must be combined  with clinical observations, patient history, and epidemiological information. The expected result is Negative.  Fact Sheet for Patients: SugarRoll.be  Fact Sheet for Healthcare Providers: https://www.woods-mathews.com/  This test is not yet approved or cleared by the [place] FDA and  has been authorized for detection and/or diagnosis of SARS-CoV-2 by FDA under an Emergency Use Authorization (EUA). This EUA will remain  in effect (meaning this test can be used) for the duration of the COVID-19 declaration under Se ction 564(b)(1) of the Act, 21 U.S.C. section 360bbb-3(b)(1), unless the authorization is terminated or revoked sooner.  Performed at [facility], [place] [address], [place], [place] [address]         Radiology Studies: MR TIBIA FIBULA LEFT WO CONTRAST  Result Date: 11/14/2020 CLINICAL DATA:  Left lower leg infection. EXAM: MRI OF LOWER LEFT EXTREMITY WITHOUT CONTRAST TECHNIQUE: Multiplanar, multisequence MR imaging of the left tibia and fibula was performed. No intravenous contrast was administered. COMPARISON:  None. FINDINGS: Bones/Joint/Cartilage No marrow signal abnormality. No fracture or dislocation. No joint effusion. Muscles and Tendons Intact.  No muscle edema or atrophy. Soft tissue Circumferential soft tissue swelling of the lower leg. No fluid collection or hematoma. No soft tissue mass. IMPRESSION: 1. Circumferential soft tissue swelling of the lower leg is nonspecific but may reflect cellulitis given clinical history. No abscess or osteomyelitis. Electronically Signed   By: Titus Dubin M.D.   On: 11/14/2020 11:11   MR FOOT LEFT WO CONTRAST  Result Date: 11/14/2020 CLINICAL DATA:  Great toe wound. EXAM: MRI OF THE LEFT FOOT WITHOUT CONTRAST TECHNIQUE: Multiplanar, multisequence MR imaging of the left foot was performed. No intravenous contrast was administered. COMPARISON:  Left foot x-rays from yesterday.  MRI left foot dated October 28, 2020. FINDINGS: Bones/Joint/Cartilage New abnormal marrow edema with corresponding decreased T1 marrow signal involving the first proximal and distal phalanges. There is also mild marrow edema in the first and second metatarsal heads as well as the second proximal phalanx. Small focus of early decreased T1 marrow signal in the plantar aspect of the second metatarsal head. Increased moderate first MTP joint effusion. No acute fracture or dislocation. Old healed fracture deformity of the second proximal phalanx. Ligaments Collateral ligaments are intact. Muscles and Tendons Flexor and extensor tendons are intact. No tenosynovitis. Increased T2 signal within the intrinsic muscles of the forefoot, nonspecific, but likely related to diabetic muscle changes. Soft tissue Progressive deep skin ulceration along the medial and plantar aspects of the great toe extending to the flexor hallucis longus tendon. There is a new 3.6 x 3.5 x 3.1 cm gas and fluid collection centered in the first intermetatarsal space and extending dorsally (series 4, image 22). Diffuse soft tissue swelling of the great toe and forefoot. Skin blistering over the  dorsal great toe. No soft tissue mass. IMPRESSION: 1. Progressive deep skin ulceration along the medial and plantar aspects of the great toe with new osteomyelitis of the first proximal and distal phalanges. New 3.6 x 3.5 x 3.1 cm abscess centered in the first intermetatarsal space. 2. Increased now moderate first MTP joint effusion with new mild marrow edema in the first metatarsal head, concerning for septic arthritis and early osteomyelitis. 3. Early osteomyelitis of the second metatarsal head which lies immediately adjacent to the abscess. New marrow edema within the second proximal phalanx is equivocal for osteomyelitis. Electronically Signed   By: Titus Dubin M.D.   On: 11/14/2020 11:08   DG Foot 2 Views Left  Result Date: 11/13/2020 CLINICAL DATA:   Left first toe wound. EXAM: LEFT FOOT - 2 VIEW COMPARISON:  October 28, 2020. FINDINGS: There is no evidence of fracture or dislocation. There is no evidence of arthropathy or other focal bone abnormality. No lytic destruction is seen to suggest osteomyelitis. There appears to be a large ulceration in the vicinity of the first proximal phalanx. IMPRESSION: Large ulceration is seen in the vicinity of the first proximal phalanx. No lytic destruction is seen to suggest osteomyelitis. Electronically Signed   By: Marijo Conception M.D.   On: 11/13/2020 16:47   VAS [place] LOWER EXTREMITY VENOUS (DVT)  Result Date: 11/14/2020  Lower Venous DVT Study Indications: Edema, and Cellulitis.  Risk Factors: Recent hospital admission for sepsis from infected LLE DM foot ulcer. Comparison Study: No previous exams Performing Technologist: Rogelia Rohrer  Examination Guidelines: A complete evaluation includes B-mode imaging, spectral Doppler, color Doppler, and power Doppler as needed of all accessible portions of each vessel. Bilateral testing is considered an integral part of a complete examination. Limited examinations for reoccurring indications may be performed as noted. The reflux portion of the exam is performed with the patient in reverse Trendelenburg.  +-----+---------------+---------+-----------+----------+--------------+ RIGHTCompressibilityPhasicitySpontaneityPropertiesThrombus Aging +-----+---------------+---------+-----------+----------+--------------+ CFV  Full           Yes      Yes                                 +-----+---------------+---------+-----------+----------+--------------+   +---------+---------------+---------+-----------+----------+--------------+ LEFT     CompressibilityPhasicitySpontaneityPropertiesThrombus Aging +---------+---------------+---------+-----------+----------+--------------+ CFV      Full           Yes      Yes                                  +---------+---------------+---------+-----------+----------+--------------+ SFJ      Full                                                        +---------+---------------+---------+-----------+----------+--------------+ FV Prox  Full           Yes      Yes                                 +---------+---------------+---------+-----------+----------+--------------+ FV Mid   Full           Yes      Yes                                 +---------+---------------+---------+-----------+----------+--------------+  FV DistalFull           Yes      Yes                                 +---------+---------------+---------+-----------+----------+--------------+ PFV      Full                                                        +---------+---------------+---------+-----------+----------+--------------+ POP      Full           Yes      Yes                                 +---------+---------------+---------+-----------+----------+--------------+ PTV      Full                                                        +---------+---------------+---------+-----------+----------+--------------+ PERO     Full                                                        +---------+---------------+---------+-----------+----------+--------------+     Summary: RIGHT: - No evidence of common femoral vein obstruction.  LEFT: - There is no evidence of deep vein thrombosis in the lower extremity. - There is no evidence of chronic venous insufficiency.  - No cystic structure found in the popliteal fossa.  *See table(s) above for measurements and observations.    Preliminary       Scheduled Meds: . aspirin EC  81 mg Oral Daily  . atorvastatin  40 mg Oral Daily  . DULoxetine  60 mg Oral Daily  . heparin  5,000 Units Subcutaneous Q8H  . insulin aspart  0-9 Units Subcutaneous TID WC  . insulin aspart  6 Units Subcutaneous TID WC  . insulin glargine  15 Units Subcutaneous QHS  . losartan   50 mg Oral Daily  . metroNIDAZOLE  500 mg Oral Q8H  . mirtazapine  15 mg Oral QHS  . nicotine  7 mg Transdermal Daily   Continuous Infusions: . ceFEPime (MAXIPIME) IV 2 g (11/14/20 1017)  . lactated ringers 100 mL/hr at 11/14/20 1310  . vancomycin       LOS: 1 day     Cordelia Poche, MD [facility] 11/14/2020, 1:25 PM  If 7PM-7AM, please contact night-coverage [email or web address]

## 2020-11-14 NOTE — Progress Notes (Signed)
Pharmacy Antibiotic Note  [name] [name] is a 60 y.o. male with hx DM presented to the ED on 11/13/2020 with worsening of left foot wound. MRI of left foot showed osteomyelitis of the great toe, early osteomyelitis of the second metatarsal head, and abscess in the first intermetatarsal space. He's currently on cefepime and vancomycin for infection.  Today, 11/14/2020: - day #1 abx - Tmax 99.1, wbc elevated - scr 0.57  Plan: - adjust vancomycin dose to 1250mg  IV q12h (est AUC 498) - continue cefepime 2gm IV q8h  ____________________________________________  Height: 5\' 11"  (180.3 cm) Weight: 74.5 kg (164 lb 3.9 oz) IBW/kg (Calculated) : 75.3  Temp (24hrs), Avg:98.6 F (37 C), Min:98.2 F (36.8 C), Max:99.1 F (37.3 C)  Recent Labs  Lab 11/13/20 1559 11/13/20 1738 11/14/20 0307  WBC 12.4*  --  13.6*  CREATININE 0.75  --  0.57*  LATICACIDVEN  --  2.0*  --     Estimated Creatinine Clearance: 106.1 mL/min (A) (by C-G formula based on SCr of 0.57 mg/dL (L)).    Allergies  Allergen Reactions  . Bee Venom Anaphylaxis  . Lactose Intolerance (Gi) Other (See Comments)    GI upset    Thank you for allowing pharmacy to be a part of this patient's care.  Lynelle Doctor 11/14/2020 11:25 AM

## 2020-11-14 NOTE — Progress Notes (Signed)
PROGRESS NOTE    [name] [name]  WUJ:[id number] DOB: 1961/06/26 DOA: 11/13/2020 PCP: [name], NP   Brief Narrative: [name] [name] is a 60 y.o. male with a history of diabetes mellitus, hypertension, chronic hepatitis C, GERD. Patient presented secondary to worsening left foot wound. Empiric Vancomycin and cefepime started on admission. MRI obtained which confirmed osteomyelitis and abscess. Orthopedic surgery consulted.   Assessment & Plan:   Principal Problem:   Osteomyelitis ([place]) Active Problems:   Chronic hepatitis C without hepatic coma (HCC)   Hepatic cirrhosis (HCC)   Type 2 diabetes mellitus with hyperlipidemia (HCC)   Essential hypertension   Cellulitis of lower extremity   Cellulitis   Foot abscess, left   Diabetic foot infection/abscess Left leg cellulitis Osteomyelitis/septic arthritis Patient started empirically on Vancomycin and Cefepime on admission. MRI of left foot significant for evidence of osteomyelitis of multiple sites in addition to new abscess. MRI of tibia/fibula significant for evidence of likely cellulitis. Mild leukocytosis. -Orthopedic surgery consulted: initial recommendation for transfer to [facility] for tentative LEFT transmetatarsal amputation on 3/18 -Continue Vancomycin and Cefepime -Start Flagyl -NPO after midnight  Diabetes mellitus, type 2 Patient is on Invokana and glipizide as an outpatient. Hemoglobin A1C of 11.2% from 2/22. Started on Lantus inpatient -Continue Lantuss 15 units daily in addition to Novolog 6 units TID qAC and SSI  Hyponatremia Mild. Sodium of 127 on admission with improvement this morning. Likely contributed to by hyperglycemia and resultant pseudohyponatremia.  Primary hypertension -Continue losartan  History of liver cirrhosis History of alcohol abuse Not currently drinking alcohol. On chart review, ultrasound in 2016 suggested possible fibrosis. MRI of abdomen in 2018 suggested early cirrhosis with no  mention of fibrosis. -Continue folate, multivitamin  Chronic anemia Chronic, mild and stable.  Hyperlipidemia -Continue Lipitor 40 mg daily  Depression -Continue Cymbalta, Remeron  GERD -Continue PPI (Protonix inpatient)   DVT prophylaxis: Heparin subq Code Status:   Code Status: DNR Family Communication: None at bedside Disposition Plan: Transfer to [facility]   Consultants:   Orthopedic surgery  Procedures:   None  Antimicrobials:  Vancomycin  Cefepime  Flagyl    Subjective: No issues this morning.  Objective: Vitals:   11/14/20 0057 11/14/20 0323 11/14/20 0545 11/14/20 0902  BP: 138/78 (!) 114/59 115/74 128/71  Pulse: 80 74 77 78  Resp: 18 17 18 16   Temp: 99.1 F (37.3 C) 98.4 F (36.9 C) 98.4 F (36.9 C) 98.2 F (36.8 C)  TempSrc: Oral Oral Oral Oral  SpO2: 98% 97% 97% 100%  Weight: 74.5 kg     Height: 5\' 11"  (1.803 m)       Intake/Output Summary (Last 24 hours) at 11/14/2020 1208 Last data filed at 11/14/2020 0600 Gross per 24 hour  Intake 9102.97 ml  Output 1200 ml  Net 7902.97 ml   Filed Weights   11/13/20 1546 11/14/20 0057  Weight: 74.4 kg 74.5 kg    Examination:  General exam: Appears calm and comfortable Respiratory system: Clear to auscultation. Respiratory effort normal. Cardiovascular system: S1 & S2 heard, RRR. No murmurs, rubs, gallops or clicks. Gastrointestinal system: Abdomen is nondistended, soft and nontender. No organomegaly or masses felt. Normal bowel sounds heard. Central nervous system: Alert and oriented. No focal neurological deficits. Musculoskeletal: No edema. No calf tenderness. Left foot with evidence of necrotic appearing tissue, erythema, swelling, purulent drainage from medial first toe. Left lower leg significantly tender over tibia with swelling and mild erythema Skin: No cyanosis.  No rashes Psychiatry: Judgement and insight appear normal. Mood & affect appropriate.     Data Reviewed: I  have personally reviewed following labs and imaging studies  CBC Lab Results  Component Value Date   WBC 13.6 (H) 11/14/2020   RBC 3.86 (L) 11/14/2020   HGB 11.5 (L) 11/14/2020   HCT 35.4 (L) 11/14/2020   MCV 91.7 11/14/2020   MCH 29.8 11/14/2020   PLT 336 11/14/2020   MCHC 32.5 11/14/2020   RDW 12.6 11/14/2020   LYMPHSABS 2.8 11/14/2020   MONOABS 1.6 (H) 11/14/2020   EOSABS 1.0 (H) 11/14/2020   BASOSABS 0.1 32/67/1245     Last metabolic panel Lab Results  Component Value Date   NA 132 (L) 11/14/2020   K 3.9 11/14/2020   CL 103 11/14/2020   CO2 20 (L) 11/14/2020   BUN 11 11/14/2020   CREATININE 0.57 (L) 11/14/2020   GLUCOSE 314 (H) 11/14/2020   GFRNONAA >60 11/14/2020   GFRAA 116 09/23/2020   CALCIUM 8.2 (L) 11/14/2020   PHOS 2.6 05/18/2020   PROT 6.6 11/14/2020   ALBUMIN 2.8 (L) 11/14/2020   LABGLOB 3.0 09/23/2020   AGRATIO 1.4 09/23/2020   BILITOT 0.5 11/14/2020   ALKPHOS 66 11/14/2020   AST 21 11/14/2020   ALT 16 11/14/2020   ANIONGAP 9 11/14/2020    CBG (last 3)  Recent Labs    11/13/20 1818 11/13/20 1927 11/14/20 0835  GLUCAP 480* 378* 205*     GFR: Estimated Creatinine Clearance: 106.1 mL/min (A) (by C-G formula based on SCr of 0.57 mg/dL (L)).  Coagulation Profile: No results for input(s): INR, PROTIME in the last 168 hours.  Recent Results (from the past 240 hour(s))  SARS CORONAVIRUS 2 (TAT 6-24 HRS) Nasopharyngeal Nasopharyngeal Swab     Status: None   Collection Time: 11/13/20  9:37 PM   Specimen: Nasopharyngeal Swab  Result Value Ref Range Status   SARS Coronavirus 2 NEGATIVE NEGATIVE Final    Comment: (NOTE) SARS-CoV-2 target nucleic acids are NOT DETECTED.  The SARS-CoV-2 RNA is generally detectable in upper and lower respiratory specimens during the acute phase of infection. Negative results do not preclude SARS-CoV-2 infection, do not rule out co-infections with other pathogens, and should not be used as the sole basis for  treatment or other patient management decisions. Negative results must be combined with clinical observations, patient history, and epidemiological information. The expected result is Negative.  Fact Sheet for Patients: SugarRoll.be  Fact Sheet for Healthcare Providers: https://www.woods-mathews.com/  This test is not yet approved or cleared by the [place] FDA and  has been authorized for detection and/or diagnosis of SARS-CoV-2 by FDA under an Emergency Use Authorization (EUA). This EUA will remain  in effect (meaning this test can be used) for the duration of the COVID-19 declaration under Se ction 564(b)(1) of the Act, 21 U.S.C. section 360bbb-3(b)(1), unless the authorization is terminated or revoked sooner.  Performed at [facility], [place] [address], [place], [place] [address]         Radiology Studies: MR TIBIA FIBULA LEFT WO CONTRAST  Result Date: 11/14/2020 CLINICAL DATA:  Left lower leg infection. EXAM: MRI OF LOWER LEFT EXTREMITY WITHOUT CONTRAST TECHNIQUE: Multiplanar, multisequence MR imaging of the left tibia and fibula was performed. No intravenous contrast was administered. COMPARISON:  None. FINDINGS: Bones/Joint/Cartilage No marrow signal abnormality. No fracture or dislocation. No joint effusion. Muscles and Tendons Intact.  No muscle edema or atrophy. Soft tissue Circumferential soft tissue swelling  of the lower leg. No fluid collection or hematoma. No soft tissue mass. IMPRESSION: 1. Circumferential soft tissue swelling of the lower leg is nonspecific but may reflect cellulitis given clinical history. No abscess or osteomyelitis. Electronically Signed   By: Titus Dubin M.D.   On: 11/14/2020 11:11   MR FOOT LEFT WO CONTRAST  Result Date: 11/14/2020 CLINICAL DATA:  Great toe wound. EXAM: MRI OF THE LEFT FOOT WITHOUT CONTRAST TECHNIQUE: Multiplanar, multisequence MR imaging of the left foot was performed. No  intravenous contrast was administered. COMPARISON:  Left foot x-rays from yesterday. MRI left foot dated October 28, 2020. FINDINGS: Bones/Joint/Cartilage New abnormal marrow edema with corresponding decreased T1 marrow signal involving the first proximal and distal phalanges. There is also mild marrow edema in the first and second metatarsal heads as well as the second proximal phalanx. Small focus of early decreased T1 marrow signal in the plantar aspect of the second metatarsal head. Increased moderate first MTP joint effusion. No acute fracture or dislocation. Old healed fracture deformity of the second proximal phalanx. Ligaments Collateral ligaments are intact. Muscles and Tendons Flexor and extensor tendons are intact. No tenosynovitis. Increased T2 signal within the intrinsic muscles of the forefoot, nonspecific, but likely related to diabetic muscle changes. Soft tissue Progressive deep skin ulceration along the medial and plantar aspects of the great toe extending to the flexor hallucis longus tendon. There is a new 3.6 x 3.5 x 3.1 cm gas and fluid collection centered in the first intermetatarsal space and extending dorsally (series 4, image 22). Diffuse soft tissue swelling of the great toe and forefoot. Skin blistering over the dorsal great toe. No soft tissue mass. IMPRESSION: 1. Progressive deep skin ulceration along the medial and plantar aspects of the great toe with new osteomyelitis of the first proximal and distal phalanges. New 3.6 x 3.5 x 3.1 cm abscess centered in the first intermetatarsal space. 2. Increased now moderate first MTP joint effusion with new mild marrow edema in the first metatarsal head, concerning for septic arthritis and early osteomyelitis. 3. Early osteomyelitis of the second metatarsal head which lies immediately adjacent to the abscess. New marrow edema within the second proximal phalanx is equivocal for osteomyelitis. Electronically Signed   By: Titus Dubin M.D.   On:  11/14/2020 11:08   DG Foot 2 Views Left  Result Date: 11/13/2020 CLINICAL DATA:  Left first toe wound. EXAM: LEFT FOOT - 2 VIEW COMPARISON:  October 28, 2020. FINDINGS: There is no evidence of fracture or dislocation. There is no evidence of arthropathy or other focal bone abnormality. No lytic destruction is seen to suggest osteomyelitis. There appears to be a large ulceration in the vicinity of the first proximal phalanx. IMPRESSION: Large ulceration is seen in the vicinity of the first proximal phalanx. No lytic destruction is seen to suggest osteomyelitis. Electronically Signed   By: Marijo Conception M.D.   On: 11/13/2020 16:47        Scheduled Meds: . aspirin EC  81 mg Oral Daily  . atorvastatin  40 mg Oral Daily  . DULoxetine  60 mg Oral Daily  . heparin  5,000 Units Subcutaneous Q8H  . insulin aspart  0-9 Units Subcutaneous TID WC  . insulin aspart  6 Units Subcutaneous TID WC  . insulin glargine  15 Units Subcutaneous QHS  . losartan  50 mg Oral Daily  . metroNIDAZOLE  500 mg Oral Q8H  . mirtazapine  15 mg Oral QHS  . nicotine  7 mg Transdermal Daily   Continuous Infusions: . ceFEPime (MAXIPIME) IV 2 g (11/14/20 1017)  . lactated ringers 100 mL/hr at 11/14/20 0600  . vancomycin       LOS: 1 day     Cordelia Poche, MD [facility] 11/14/2020, 12:08 PM  If 7PM-7AM, please contact night-coverage [email or web address]

## 2020-11-14 NOTE — Consult Note (Signed)
WOC consult requested for left foot wound.  MRI indicates '"Early osteomyelitis of the second metatarsal head which lies immediately adjacent to the abscess. New marrow edema within the second proximal phalanx is equivocal for osteomyelitis. This complex medical condition is beyond the scope of practice for [facility] nurses.  Please refer to ortho service for further plan of care.  Secure chat message sent to primary team to inform them of this information. Please re-consult if further assistance is needed.  Thank-you,  Julien Girt MSN, [place], [place], [place], [place]

## 2020-11-14 NOTE — Consult Note (Signed)
ORTHOPAEDIC CONSULTATION  REQUESTING PHYSICIAN: Mariel Aloe, MD  Chief Complaint: Abscess osteomyelitis left forefoot with a skeletonized great toe  HPI: [name] [name] is a 60 y.o. male who presents with diabetic insensate neuropathy uncontrolled also a smoker who has been going to the wound center in [facility] for wound care for the left great toe.  Patient has undergone dressing changes with Dakin solution.  Past Medical History:  Diagnosis Date  . Barrett's esophagus dx 2016  . Bronchitis   . Depression   . Diabetes ([place])   . ED (erectile dysfunction)   . GERD (gastroesophageal reflux disease)   . Hepatitis C   . Hiatal hernia 10/2014   3cm  . Neuropathy    Past Surgical History:  Procedure Laterality Date  . APPENDECTOMY    . COLONOSCOPY N/A 02/16/2014   Procedure: COLONOSCOPY;  Surgeon: Gatha Mayer, MD;  Location: [facility] END[facility];  Service: Endoscopy;  Laterality: N/A;  . COLONOSCOPY    . TONSILLECTOMY     Social History   Socioeconomic History  . Marital status: Single    Spouse name: Not on file  . Number of children: Not on file  . Years of education: Not on file  . Highest education level: Not on file  Occupational History  . Not on file  Tobacco Use  . Smoking status: Current Every Day Smoker    Packs/day: 0.50    Years: 35.00    Pack years: 17.50    Types: Cigarettes  . Smokeless tobacco: Never Used  . Tobacco comment: none x 3 weeks as of 05/22/2020  Vaping Use  . Vaping Use: Never used  Substance and Sexual Activity  . Alcohol use: Not Currently    Comment: daily  . Drug use: No  . Sexual activity: Not on file  Other Topics Concern  . Not on file  Social History Narrative  . Not on file   Social Determinants of Health   Financial Resource Strain: Not on file  Food Insecurity: Not on file  Transportation Needs: Not on file  Physical Activity: Not on file  Stress: Not on file  Social Connections: Not on file   Family History   Problem Relation Age of Onset  . Cancer Mother        breast  . Diabetes Mother   . Heart disease Maternal Grandfather   . Pancreatic cancer Paternal Grandmother   . Stroke Father   . Alcohol abuse Father   . Pancreatic cancer Father   . Colon cancer Neg Hx   . Stomach cancer Neg Hx   . Rectal cancer Neg Hx   . Esophageal cancer Neg Hx    - negative except otherwise stated in the family history section Allergies  Allergen Reactions  . Bee Venom Anaphylaxis  . Lactose Intolerance (Gi) Other (See Comments)    GI upset   Prior to Admission medications   Medication Sig Start Date End Date Taking? Authorizing Provider  albuterol (VENTOLIN HFA) 108 (90 Base) MCG/ACT inhaler Inhale 2 puffs into the lungs every 6 (six) hours as needed for wheezing or shortness of breath. 06/16/19  Yes [name], NP  amoxicillin-clavulanate (AUGMENTIN) 875-125 MG tablet Take 1 tablet by mouth 2 (two) times daily for 14 days. Patient taking differently: Take 1 tablet by mouth 2 (two) times daily. Start date : 11/01/20 11/01/20 11/15/20 Yes Georgette Shell, MD  aspirin EC 81 MG tablet Take 1 tablet (81 mg  total) by mouth daily. 04/02/17  Yes Funches, Josalyn, MD  atorvastatin (LIPITOR) 40 MG tablet Take 1 tablet (40 mg total) by mouth daily. Please fill as a 90 day supply. 09/16/20  Yes [name], NP  canagliflozin (INVOKANA) 300 MG TABS tablet Take 1 tablet (300 mg total) by mouth daily before breakfast. Please fill as a 90 day supply Patient taking differently: Take 300 mg by mouth daily before breakfast. 09/16/20  Yes [name], NP  DULoxetine (CYMBALTA) 60 MG capsule Take 1 capsule (60 mg total) by mouth daily. Please fill as a 90 day supply Patient taking differently: Take 60 mg by mouth daily. 09/16/20 12/15/20 Yes [name], NP  EPINEPHrine 0.3 mg/0.3 mL IJ SOAJ injection Inject 0.3 mg into the muscle as needed for anaphylaxis. 06/10/20  Yes [name], NP  folic acid  (FOLVITE) 1 MG tablet Take 1 tablet (1 mg total) by mouth daily. 05/21/20  Yes Eugenie Filler, MD  glipiZIDE (GLUCOTROL) 5 MG tablet Take 1 tablet (5 mg total) by mouth daily before breakfast. Please fill as a 90 day supply Patient taking differently: Take 5 mg by mouth daily before breakfast. 09/16/20 12/15/20 Yes [name], NP  insulin glargine (LANTUS) 100 UNIT/ML Solostar Pen Inject 20 Units into the skin daily at 10 pm. 11/04/20 12/04/20 Yes [name], NP  loperamide (IMODIUM A-D) 2 MG tablet Take 1 tablet (2 mg total) by mouth 4 (four) times daily as needed for diarrhea or loose stools. 06/14/20  Yes [name], NP  loratadine (CLARITIN) 10 MG tablet Take 1 tablet (10 mg total) by mouth daily. Please fill as a 90 day supply Patient taking differently: Take 10 mg by mouth daily. 09/16/20 12/15/20 Yes [name], NP  losartan (COZAAR) 50 MG tablet Take 1 tablet (50 mg total) by mouth daily. Please fill as a 90 day supply Patient taking differently: Take 50 mg by mouth daily. 11/04/20 02/02/21 Yes [name], NP  mirtazapine (REMERON) 15 MG tablet Take 1 tablet (15 mg total) by mouth at bedtime. Please fill as a 90 day supply Patient taking differently: Take 15 mg by mouth at bedtime. 09/16/20 12/15/20 Yes [name], NP  Multiple Vitamins-Minerals (MULTIVITAMIN ADULT) TABS Take 1 tablet by mouth daily. 04/02/17  Yes Funches, Josalyn, MD  mupirocin ointment (BACTROBAN) 2 % Apply 1 application topically 2 (two) times daily. Patient taking differently: Apply 1 application topically daily. 11/04/20  Yes [name], NP  nicotine (NICODERM CQ - DOSED IN MG/24 HOURS) 21 mg/24hr patch Place 1 patch (21 mg total) onto the skin daily. 11/02/20  Yes Georgette Shell, MD  omeprazole (PRILOSEC) 40 MG capsule Take 1 capsule (40 mg total) by mouth daily. Please fill as a 90 day supply Patient taking differently: Take 40 mg by mouth daily. 09/16/20 12/15/20 Yes [name], NP   sulfamethoxazole-trimethoprim (BACTRIM DS) 800-160 MG tablet Take 1 tablet by mouth 2 (two) times daily. Patient taking differently: Take 1 tablet by mouth 2 (two) times daily. Start date : 11/01/20 11/01/20  Yes Georgette Shell, MD  Insulin Pen Needle (TRUEPLUS 5-BEVEL PEN NEEDLES) 32G X 4 MM MISC Use as directed 11/04/20   [name], NP   MR TIBIA FIBULA LEFT WO CONTRAST  Result Date: 11/14/2020 CLINICAL DATA:  Left lower leg infection. EXAM: MRI OF LOWER LEFT EXTREMITY WITHOUT CONTRAST TECHNIQUE: Multiplanar, multisequence MR imaging of the left tibia and fibula  was performed. No intravenous contrast was administered. COMPARISON:  None. FINDINGS: Bones/Joint/Cartilage No marrow signal abnormality. No fracture or dislocation. No joint effusion. Muscles and Tendons Intact.  No muscle edema or atrophy. Soft tissue Circumferential soft tissue swelling of the lower leg. No fluid collection or hematoma. No soft tissue mass. IMPRESSION: 1. Circumferential soft tissue swelling of the lower leg is nonspecific but may reflect cellulitis given clinical history. No abscess or osteomyelitis. Electronically Signed   By: Titus Dubin M.D.   On: 11/14/2020 11:11   MR FOOT LEFT WO CONTRAST  Result Date: 11/14/2020 CLINICAL DATA:  Great toe wound. EXAM: MRI OF THE LEFT FOOT WITHOUT CONTRAST TECHNIQUE: Multiplanar, multisequence MR imaging of the left foot was performed. No intravenous contrast was administered. COMPARISON:  Left foot x-rays from yesterday. MRI left foot dated October 28, 2020. FINDINGS: Bones/Joint/Cartilage New abnormal marrow edema with corresponding decreased T1 marrow signal involving the first proximal and distal phalanges. There is also mild marrow edema in the first and second metatarsal heads as well as the second proximal phalanx. Small focus of early decreased T1 marrow signal in the plantar aspect of the second metatarsal head. Increased moderate first MTP joint effusion. No  acute fracture or dislocation. Old healed fracture deformity of the second proximal phalanx. Ligaments Collateral ligaments are intact. Muscles and Tendons Flexor and extensor tendons are intact. No tenosynovitis. Increased T2 signal within the intrinsic muscles of the forefoot, nonspecific, but likely related to diabetic muscle changes. Soft tissue Progressive deep skin ulceration along the medial and plantar aspects of the great toe extending to the flexor hallucis longus tendon. There is a new 3.6 x 3.5 x 3.1 cm gas and fluid collection centered in the first intermetatarsal space and extending dorsally (series 4, image 22). Diffuse soft tissue swelling of the great toe and forefoot. Skin blistering over the dorsal great toe. No soft tissue mass. IMPRESSION: 1. Progressive deep skin ulceration along the medial and plantar aspects of the great toe with new osteomyelitis of the first proximal and distal phalanges. New 3.6 x 3.5 x 3.1 cm abscess centered in the first intermetatarsal space. 2. Increased now moderate first MTP joint effusion with new mild marrow edema in the first metatarsal head, concerning for septic arthritis and early osteomyelitis. 3. Early osteomyelitis of the second metatarsal head which lies immediately adjacent to the abscess. New marrow edema within the second proximal phalanx is equivocal for osteomyelitis. Electronically Signed   By: Titus Dubin M.D.   On: 11/14/2020 11:08   DG Foot 2 Views Left  Result Date: 11/13/2020 CLINICAL DATA:  Left first toe wound. EXAM: LEFT FOOT - 2 VIEW COMPARISON:  October 28, 2020. FINDINGS: There is no evidence of fracture or dislocation. There is no evidence of arthropathy or other focal bone abnormality. No lytic destruction is seen to suggest osteomyelitis. There appears to be a large ulceration in the vicinity of the first proximal phalanx. IMPRESSION: Large ulceration is seen in the vicinity of the first proximal phalanx. No lytic destruction is  seen to suggest osteomyelitis. Electronically Signed   By: Marijo Conception M.D.   On: 11/13/2020 16:47   VAS [place] LOWER EXTREMITY VENOUS (DVT)  Result Date: 11/14/2020  Lower Venous DVT Study Indications: Edema, and Cellulitis.  Risk Factors: Recent hospital admission for sepsis from infected LLE DM foot ulcer. Comparison Study: No previous exams Performing Technologist: Rogelia Rohrer  Examination Guidelines: A complete evaluation includes B-mode imaging, spectral Doppler, color Doppler, and power  Doppler as needed of all accessible portions of each vessel. Bilateral testing is considered an integral part of a complete examination. Limited examinations for reoccurring indications may be performed as noted. The reflux portion of the exam is performed with the patient in reverse Trendelenburg.  +-----+---------------+---------+-----------+----------+--------------+ RIGHTCompressibilityPhasicitySpontaneityPropertiesThrombus Aging +-----+---------------+---------+-----------+----------+--------------+ CFV  Full           Yes      Yes                                 +-----+---------------+---------+-----------+----------+--------------+   +---------+---------------+---------+-----------+----------+--------------+ LEFT     CompressibilityPhasicitySpontaneityPropertiesThrombus Aging +---------+---------------+---------+-----------+----------+--------------+ CFV      Full           Yes      Yes                                 +---------+---------------+---------+-----------+----------+--------------+ SFJ      Full                                                        +---------+---------------+---------+-----------+----------+--------------+ FV Prox  Full           Yes      Yes                                 +---------+---------------+---------+-----------+----------+--------------+ FV Mid   Full           Yes      Yes                                  +---------+---------------+---------+-----------+----------+--------------+ FV DistalFull           Yes      Yes                                 +---------+---------------+---------+-----------+----------+--------------+ PFV      Full                                                        +---------+---------------+---------+-----------+----------+--------------+ POP      Full           Yes      Yes                                 +---------+---------------+---------+-----------+----------+--------------+ PTV      Full                                                        +---------+---------------+---------+-----------+----------+--------------+ PERO     Full                                                        +---------+---------------+---------+-----------+----------+--------------+  Summary: RIGHT: - No evidence of common femoral vein obstruction.  LEFT: - There is no evidence of deep vein thrombosis in the lower extremity. - There is no evidence of chronic venous insufficiency.  - No cystic structure found in the popliteal fossa.  *See table(s) above for measurements and observations.    Preliminary    - pertinent xrays, CT, MRI studies were reviewed and independently interpreted  Positive ROS: All other systems have been reviewed and were otherwise negative with the exception of those mentioned in the HPI and as above.  Physical Exam: General: Alert, no acute distress Psychiatric: Patient is competent for consent with normal mood and affect Lymphatic: No axillary or cervical lymphadenopathy Cardiovascular: No pedal edema Respiratory: No cyanosis, no use of accessory musculature GI: No organomegaly, abdomen is soft and non-tender    Images:  @ENCIMAGES @  Labs:  Lab Results  Component Value Date   HGBA1C 11.2 (H) 10/28/2020   HGBA1C 12.1 (H) 09/23/2020   HGBA1C 6.1 (H) 05/13/2020   ESRSEDRATE 77 (H) 11/14/2020   ESRSEDRATE 85 (H) 11/01/2020    ESRSEDRATE 74 (H) 10/31/2020   CRP 7.7 (H) 11/01/2020   CRP 13.0 (H) 10/31/2020   CRP 19.6 (H) 10/30/2020   LABURIC 1.1 (L) 05/16/2020   REPTSTATUS 11/02/2020 FINAL 10/28/2020   GRAMSTAIN NO WBC SEEN FEW GRAM POSITIVE COCCI  10/28/2020   CULT  10/28/2020    ABUNDANT GROUP A STREP (S.PYOGENES) ISOLATED FEW KLEBSIELLA OXYTOCA Beta hemolytic streptococci are predictably susceptible to penicillin and other beta lactams. Susceptibility testing not routinely performed. NO ANAEROBES ISOLATED Performed at [facility], [place] [address], [place], [place] [address]    [place] 10/28/2020    Lab Results  Component Value Date   ALBUMIN 2.8 (L) 11/14/2020   ALBUMIN 3.4 (L) 11/13/2020   ALBUMIN 2.8 (L) 11/01/2020   LABURIC 1.1 (L) 05/16/2020    Neurologic: Patient does not have protective sensation bilateral lower extremities.   MUSCULOSKELETAL:   Skin: Examination patient has a skeletonized left great toe there is necrotic flexor hallucis longus tendon there is an ulcer that extends down to the second metatarsal head.  Patient has cellulitis to just proximal of the metatarsal heads.  Patient has a strong dorsalis pedis and posterior tibial pulse bilaterally.  Review of the MRI scan shows osteomyelitis involving the great toe and second metatarsal head.  Patient has moderate protein caloric malnutrition with an albumin of 2.8 and uncontrolled type 2 diabetes with his last hemoglobin A1c is 11.2 and 12.1  Patient is a smoker.    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  Plan: We will plan for a transmetatarsal amputation left foot, risk and benefits were discussed including increased risk of the wound not healing and a higher level amputation if the glucose remains uncontrolled and patient continues smoking.   Patient states he understands wished to proceed with surgery at this time.  Thank you for the consult and the opportunity to see Mr. Dorothyann Peng, [place] [phone] 1:47 PM

## 2020-11-14 NOTE — Progress Notes (Signed)
COVID negative.  Fall precautions only.  MRI to come get Patient 0730 via W/C.

## 2020-11-14 NOTE — Plan of Care (Signed)
Received to 1334 via stretcher.  Air/Con precautions per RN judgement pending COVID results.  See Results Review and yellow cady on door for current precautions.  Expressive of his needs and agrees to report changes in condition.

## 2020-11-14 NOTE — Progress Notes (Signed)
Report to [facility].  [facility] transported to [facility] per MD request.  Report previously called to [facility].

## 2020-11-15 ENCOUNTER — Ambulatory Visit: Payer: Self-pay

## 2020-11-15 ENCOUNTER — Inpatient Hospital Stay (HOSPITAL_COMMUNITY): Payer: Medicaid Other | Admitting: Anesthesiology

## 2020-11-15 ENCOUNTER — Encounter (HOSPITAL_COMMUNITY): Payer: Self-pay | Admitting: Internal Medicine

## 2020-11-15 ENCOUNTER — Encounter (HOSPITAL_COMMUNITY)
Admission: EM | Disposition: A | Discharge status: Skilled Nursing Facility | Payer: Self-pay | Source: Ambulatory Visit | Attending: Internal Medicine

## 2020-11-15 DIAGNOSIS — M86272 Subacute osteomyelitis, left ankle and foot: Secondary | ICD-10-CM

## 2020-11-15 DIAGNOSIS — L02612 Cutaneous abscess of left foot: Secondary | ICD-10-CM

## 2020-11-15 DIAGNOSIS — E440 Moderate protein-calorie malnutrition: Secondary | ICD-10-CM

## 2020-11-15 DIAGNOSIS — B182 Chronic viral hepatitis C: Secondary | ICD-10-CM

## 2020-11-15 DIAGNOSIS — F17200 Nicotine dependence, unspecified, uncomplicated: Secondary | ICD-10-CM

## 2020-11-15 DIAGNOSIS — M869 Osteomyelitis, unspecified: Secondary | ICD-10-CM

## 2020-11-15 DIAGNOSIS — E1169 Type 2 diabetes mellitus with other specified complication: Secondary | ICD-10-CM

## 2020-11-15 DIAGNOSIS — E785 Hyperlipidemia, unspecified: Secondary | ICD-10-CM

## 2020-11-15 DIAGNOSIS — L03116 Cellulitis of left lower limb: Secondary | ICD-10-CM

## 2020-11-15 HISTORY — PX: AMPUTATION: SHX166

## 2020-11-15 LAB — COMPREHENSIVE METABOLIC PANEL
ALT: 22 U/L (ref 0–44)
AST: 26 U/L (ref 15–41)
Albumin: 2.8 g/dL — ABNORMAL LOW (ref 3.5–5.0)
Alkaline Phosphatase: 71 U/L (ref 38–126)
Anion gap: 8 (ref 5–15)
BUN: 8 mg/dL (ref 6–20)
CO2: 21 mmol/L — ABNORMAL LOW (ref 22–32)
Calcium: 8.8 mg/dL — ABNORMAL LOW (ref 8.9–10.3)
Chloride: 102 mmol/L (ref 98–111)
Creatinine, Ser: 0.51 mg/dL — ABNORMAL LOW (ref 0.61–1.24)
GFR, Estimated: >60 mL/min (ref >60)
Glucose, Bld: 307 mg/dL — ABNORMAL HIGH (ref 70–99)
Potassium: 4.2 mmol/L (ref 3.5–5.1)
Sodium: 131 mmol/L — ABNORMAL LOW (ref 135–145)
Total Bilirubin: 0.8 mg/dL (ref 0.3–1.2)
Total Protein: 7.3 g/dL (ref 6.5–8.1)

## 2020-11-15 LAB — GLUCOSE, CAPILLARY
Glucose-Capillary: 257 mg/dL — ABNORMAL HIGH (ref 70–99)
Glucose-Capillary: 259 mg/dL — ABNORMAL HIGH (ref 70–99)
Glucose-Capillary: 254 mg/dL — ABNORMAL HIGH (ref 70–99)
Glucose-Capillary: 279 mg/dL — ABNORMAL HIGH (ref 70–99)
Glucose-Capillary: 291 mg/dL — ABNORMAL HIGH (ref 70–99)

## 2020-11-15 LAB — CBC
HCT: 40 % (ref 39.0–52.0)
Hemoglobin: 13.5 g/dL (ref 13.0–17.0)
MCH: 29.7 pg (ref 26.0–34.0)
MCHC: 33.8 g/dL (ref 30.0–36.0)
MCV: 88.1 fL (ref 80.0–100.0)
Platelets: 381 10*3/uL (ref 150–400)
RBC: 4.54 MIL/uL (ref 4.22–5.81)
RDW: 12.5 % (ref 11.5–15.5)
WBC: 9.1 10*3/uL (ref 4.0–10.5)
nRBC: 0 % (ref 0.0–0.2)

## 2020-11-15 LAB — PROTIME-INR
INR: 1 (ref 0.8–1.2)
Prothrombin Time: 13.2 seconds (ref 11.4–15.2)

## 2020-11-15 LAB — MRSA PCR SCREENING: MRSA by PCR: NEGATIVE

## 2020-11-15 SURGERY — AMPUTATION, FOOT, PARTIAL
Anesthesia: General | Site: Foot | Laterality: Left

## 2020-11-15 MED ORDER — FENTANYL CITRATE (PF) 250 MCG/5ML IJ SOLN
INTRAMUSCULAR | Status: DC | PRN
  Administered 2020-11-15: 50 ug via INTRAVENOUS

## 2020-11-15 MED ORDER — SACCHAROMYCES BOULARDII 250 MG PO CAPS
250.0000 mg | ORAL_CAPSULE | Freq: Two times a day (BID) | ORAL | Status: DC
  Administered 2020-11-15 – 2020-11-26 (×22): 250 mg via ORAL
  Filled 2020-11-15 (×22): qty 1

## 2020-11-15 MED ORDER — LOPERAMIDE HCL 2 MG PO CAPS
2.0000 mg | ORAL_CAPSULE | Freq: Three times a day (TID) | ORAL | Status: DC | PRN
  Administered 2020-11-16 – 2020-11-25 (×8): 2 mg via ORAL
  Filled 2020-11-15 (×8): qty 1

## 2020-11-15 MED ORDER — ONDANSETRON HCL 4 MG PO TABS: 4.0000 mg | ORAL_TABLET | Freq: Four times a day (QID) | ORAL | Status: DC | PRN

## 2020-11-15 MED ORDER — DOCUSATE SODIUM 100 MG PO CAPS
100.0000 mg | ORAL_CAPSULE | Freq: Two times a day (BID) | ORAL | Status: DC
  Administered 2020-11-18: 100 mg via ORAL
  Filled 2020-11-15 (×4): qty 1

## 2020-11-15 MED ORDER — MIDAZOLAM HCL 2 MG/2ML IJ SOLN
INTRAMUSCULAR | Status: AC
  Filled 2020-11-15: qty 2

## 2020-11-15 MED ORDER — HYDROMORPHONE HCL 1 MG/ML IJ SOLN: 0.5000 mg | INTRAMUSCULAR | Status: DC | PRN

## 2020-11-15 MED ORDER — SODIUM CHLORIDE 0.9 % IV SOLN: INTRAVENOUS | Status: DC

## 2020-11-15 MED ORDER — OXYCODONE HCL 5 MG PO TABS
5.0000 mg | ORAL_TABLET | ORAL | Status: DC | PRN
  Administered 2020-11-15: 10 mg via ORAL
  Administered 2020-11-15: 5 mg via ORAL
  Administered 2020-11-16 – 2020-11-24 (×35): 10 mg via ORAL
  Filled 2020-11-15: qty 2
  Filled 2020-11-15: qty 1
  Filled 2020-11-15 (×16): qty 2
  Filled 2020-11-15 (×2): qty 1
  Filled 2020-11-15 (×17): qty 2

## 2020-11-15 MED ORDER — INSULIN ASPART 100 UNIT/ML ~~LOC~~ SOLN
SUBCUTANEOUS | Status: AC
  Filled 2020-11-15: qty 1

## 2020-11-15 MED ORDER — CHLORHEXIDINE GLUCONATE 0.12 % MT SOLN
OROMUCOSAL | Status: AC
  Administered 2020-11-15: 15 mL via OROMUCOSAL
  Filled 2020-11-15: qty 15

## 2020-11-15 MED ORDER — ONDANSETRON HCL 4 MG/2ML IJ SOLN: 4.0000 mg | Freq: Once | INTRAMUSCULAR | Status: DC | PRN

## 2020-11-15 MED ORDER — ONDANSETRON HCL 4 MG/2ML IJ SOLN
INTRAMUSCULAR | Status: DC | PRN
  Administered 2020-11-15: 4 mg via INTRAVENOUS

## 2020-11-15 MED ORDER — ONDANSETRON HCL 4 MG/2ML IJ SOLN: 4.0000 mg | Freq: Four times a day (QID) | INTRAMUSCULAR | Status: DC | PRN

## 2020-11-15 MED ORDER — LIDOCAINE 2% (20 MG/ML) 5 ML SYRINGE
INTRAMUSCULAR | Status: DC | PRN
  Administered 2020-11-15: 60 mg via INTRAVENOUS

## 2020-11-15 MED ORDER — DEXAMETHASONE SODIUM PHOSPHATE 10 MG/ML IJ SOLN
INTRAMUSCULAR | Status: DC | PRN
  Administered 2020-11-15: 5 mg via INTRAVENOUS

## 2020-11-15 MED ORDER — FENTANYL CITRATE (PF) 250 MCG/5ML IJ SOLN
INTRAMUSCULAR | Status: AC
  Filled 2020-11-15: qty 5

## 2020-11-15 MED ORDER — PROPOFOL 10 MG/ML IV BOLUS
INTRAVENOUS | Status: DC | PRN
  Administered 2020-11-15: 130 mg via INTRAVENOUS

## 2020-11-15 MED ORDER — FENTANYL CITRATE (PF) 100 MCG/2ML IJ SOLN: 25.0000 ug | INTRAMUSCULAR | Status: DC | PRN

## 2020-11-15 MED ORDER — PHENYLEPHRINE 40 MCG/ML (10ML) SYRINGE FOR IV PUSH (FOR BLOOD PRESSURE SUPPORT)
PREFILLED_SYRINGE | INTRAVENOUS | Status: DC | PRN
  Administered 2020-11-15: 80 ug via INTRAVENOUS

## 2020-11-15 MED ORDER — OXYCODONE HCL 5 MG PO TABS
5.0000 mg | ORAL_TABLET | Freq: Once | ORAL | Status: DC | PRN
Start: 2020-11-15 — End: 2020-11-15

## 2020-11-15 MED ORDER — LACTATED RINGERS IV SOLN: INTRAVENOUS | Status: DC

## 2020-11-15 MED ORDER — CHLORHEXIDINE GLUCONATE 0.12 % MT SOLN: 15.0000 mL | OROMUCOSAL | Status: AC

## 2020-11-15 MED ORDER — MIDAZOLAM HCL 2 MG/2ML IJ SOLN
INTRAMUSCULAR | Status: DC | PRN
  Administered 2020-11-15: 2 mg via INTRAVENOUS

## 2020-11-15 MED ORDER — METOCLOPRAMIDE HCL 5 MG/ML IJ SOLN: 5.0000 mg | Freq: Three times a day (TID) | INTRAMUSCULAR | Status: DC | PRN

## 2020-11-15 MED ORDER — METOCLOPRAMIDE HCL 5 MG PO TABS: 5.0000 mg | ORAL_TABLET | Freq: Three times a day (TID) | ORAL | Status: DC | PRN

## 2020-11-15 MED ORDER — AMISULPRIDE (ANTIEMETIC) 5 MG/2ML IV SOLN: 10.0000 mg | Freq: Once | INTRAVENOUS | Status: DC | PRN

## 2020-11-15 MED ORDER — OXYCODONE HCL 5 MG/5ML PO SOLN: 5.0000 mg | Freq: Once | ORAL | Status: DC | PRN

## 2020-11-15 SURGICAL SUPPLY — 33 items
APL SKNCLS STERI-STRIP NONHPOA (GAUZE/BANDAGES/DRESSINGS) ×1
BENZOIN TINCTURE PRP APPL 2/3 (GAUZE/BANDAGES/DRESSINGS) ×2 IMPLANT
BLADE SAW SGTL HD 18.5X60.5X1. (BLADE) ×2 IMPLANT
BLADE SURG 21 STRL SS (BLADE) ×2 IMPLANT
BNDG COHESIVE 4X5 TAN STRL (GAUZE/BANDAGES/DRESSINGS) IMPLANT
BNDG GAUZE ELAST 4 BULKY (GAUZE/BANDAGES/DRESSINGS) IMPLANT
COVER SURGICAL LIGHT HANDLE (MISCELLANEOUS) ×2 IMPLANT
COVER WAND RF STERILE (DRAPES) ×2 IMPLANT
DRAPE INCISE IOBAN 66X45 STRL (DRAPES) ×2 IMPLANT
DRAPE U-SHAPE 47X51 STRL (DRAPES) ×2 IMPLANT
DRSG ADAPTIC 3X8 NADH LF (GAUZE/BANDAGES/DRESSINGS) IMPLANT
DRSG PAD ABDOMINAL 8X10 ST (GAUZE/BANDAGES/DRESSINGS) IMPLANT
DURAPREP 26ML APPLICATOR (WOUND CARE) ×2 IMPLANT
ELECT REM PT RETURN 9FT ADLT (ELECTROSURGICAL) ×2
ELECTRODE REM PT RTRN 9FT ADLT (ELECTROSURGICAL) ×1 IMPLANT
GAUZE SPONGE 4X4 12PLY STRL (GAUZE/BANDAGES/DRESSINGS) IMPLANT
GLOVE BIOGEL PI IND STRL 9 (GLOVE) ×1 IMPLANT
GLOVE BIOGEL PI INDICATOR 9 (GLOVE) ×1
GLOVE SURG ORTHO 9.0 STRL STRW (GLOVE) ×2 IMPLANT
GOWN STRL REUS W/ TWL XL LVL3 (GOWN DISPOSABLE) ×3 IMPLANT
GOWN STRL REUS W/TWL XL LVL3 (GOWN DISPOSABLE) ×6
KIT BASIN OR (CUSTOM PROCEDURE TRAY) ×2 IMPLANT
KIT TURNOVER KIT B (KITS) ×2 IMPLANT
NS IRRIG 1000ML POUR BTL (IV SOLUTION) ×2 IMPLANT
PACK ORTHO EXTREMITY (CUSTOM PROCEDURE TRAY) ×2 IMPLANT
PAD ARMBOARD 7.5X6 YLW CONV (MISCELLANEOUS) ×4 IMPLANT
SPONGE LAP 18X18 RF (DISPOSABLE) IMPLANT
SUT ETHILON 2 0 PSLX (SUTURE) ×4 IMPLANT
TOWEL GREEN STERILE (TOWEL DISPOSABLE) ×2 IMPLANT
TOWEL GREEN STERILE FF (TOWEL DISPOSABLE) ×2 IMPLANT
TUBE CONNECTING 12X1/4 (SUCTIONS) ×2 IMPLANT
WATER STERILE IRR 1000ML POUR (IV SOLUTION) ×2 IMPLANT
YANKAUER SUCT BULB TIP NO VENT (SUCTIONS) ×2 IMPLANT

## 2020-11-15 NOTE — Progress Notes (Signed)
Pt is A&O x4. Pt had sips of water and ice chips this morning. CHG bath completed. Pt to short stay, report was given to [facility].Marland Kitchen

## 2020-11-15 NOTE — Transfer of Care (Signed)
Immediate Anesthesia Transfer of Care Note  Patient: [name] [name]  Procedure(s) Performed: LEFT TRANSMETATARSAL AMPUTATION (Left Foot)  Patient Location: PACU  Anesthesia Type:General  Level of Consciousness: drowsy and patient cooperative  Airway & Oxygen Therapy: Patient Spontanous Breathing  Post-op Assessment: Report given to RN, Post -op Vital signs reviewed and stable and Patient moving all extremities X 4  Post vital signs: Reviewed and stable  Last Vitals:  Vitals Value Taken Time  BP 97/64 11/15/20 0936  Temp    Pulse 67 11/15/20 0937  Resp 11 11/15/20 0937  SpO2 95 % 11/15/20 0937  Vitals shown include unvalidated device data.  Last Pain:  Vitals:   11/15/20 0300  TempSrc: Oral  PainSc:       Patients Stated Pain Goal: 2 (64/84/72 0721)  Complications: No complications documented.

## 2020-11-15 NOTE — Op Note (Signed)
11/15/2020  9:43 AM  PATIENT:  [name] [name]    PRE-OPERATIVE DIAGNOSIS:  Abscess, Osteomyelitis Left Foot  POST-OPERATIVE DIAGNOSIS:  Same  PROCEDURE:  LEFT TRANSMETATARSAL AMPUTATION Placement of Prevena wound VAC.  SURGEON:  Newt Minion, MD  PHYSICIAN ASSISTANT:None ANESTHESIA:   General  PREOPERATIVE INDICATIONS:  CAYNE YOM is a  60 y.o. male with a diagnosis of Abscess, Osteomyelitis Left Foot who failed conservative measures and elected for surgical management.    The risks benefits and alternatives were discussed with the patient preoperatively including but not limited to the risks of infection, bleeding, nerve injury, cardiopulmonary complications, the need for revision surgery, among others, and the patient was willing to proceed.  OPERATIVE IMPLANTS: Praveena wound VAC  @ENCIMAGES @  OPERATIVE FINDINGS: Abscess extended to the surgical margins.  The forefoot was sent for cultures.  Patient will need 4 weeks of oral antibiotics after discharge.  OPERATIVE PROCEDURE: Patient was brought the operating room and underwent a general anesthetic.  After adequate levels anesthesia were obtained patient's left lower extremity was prepped using DuraPrep draped into a sterile field a timeout was called.  A fishmouth incision was made just proximal to the necrotic tissue.  There was purulence at the resection margins.  A transmetatarsal amputation was performed with an oscillating saw.  The soft tissue adjacent to the abscess was resected with a rondure.  The wound was irrigated with normal saline electrocautery was used hemostasis.  The incision was closed using 2-0 nylon a Prevena wound VAC was applied this had a good suction fit.  Patient was extubated taken the PACU in stable condition.   DISCHARGE PLANNING:  Antibiotic duration: Continue IV antibiotics and plan on discharge on 4 weeks of oral antibiotics.  Weightbearing: Nonweightbearing on the left  Pain medication:  Opioid pathway  Dressing care/ Wound VAC: Continue wound VAC for 1 week  Ambulatory devices: Walker or crutches or kneeling scooter  Discharge to: Anticipate discharge to home  Follow-up: In the office 1 week post operative.

## 2020-11-15 NOTE — Anesthesia Procedure Notes (Signed)
Procedure Name: Intubation Date/Time: 11/15/2020 9:07 AM Performed by: Darletta Moll, CRNA Pre-anesthesia Checklist: Patient identified, Emergency Drugs available, Suction available and Patient being monitored Patient Re-evaluated:Patient Re-evaluated prior to induction Oxygen Delivery Method: Circle system utilized Preoxygenation: Pre-oxygenation with 100% oxygen Induction Type: IV induction Ventilation: Mask ventilation without difficulty LMA: LMA inserted LMA Size: 4.0 Number of attempts: 1 Placement Confirmation: positive ETCO2,  breath sounds checked- equal and bilateral and CO2 detector Tube secured with: Tape Dental Injury: Teeth and Oropharynx as per pre-operative assessment

## 2020-11-15 NOTE — Anesthesia Preprocedure Evaluation (Addendum)
Anesthesia Evaluation  Patient identified by MRN, date of birth, ID band Patient awake    Reviewed: Allergy & Precautions, NPO status , Patient's Chart, lab work & pertinent test results  History of Anesthesia Complications Negative for: history of anesthetic complications  Airway Mallampati: II  TM Distance: >3 FB Neck ROM: Full    Dental  (+) Missing, Poor Dentition, Chipped   Pulmonary Current Smoker,    Pulmonary exam normal        Cardiovascular hypertension, Normal cardiovascular exam     Neuro/Psych negative neurological ROS  negative psych ROS   GI/Hepatic hiatal hernia, GERD  ,(+) Cirrhosis     substance abuse (in remission)  alcohol use, Hepatitis -, C  Endo/Other  diabetes, Type 2  Renal/GU negative Renal ROS  negative genitourinary   Musculoskeletal negative musculoskeletal ROS (+)   Abdominal   Peds  Hematology negative hematology ROS (+)   Anesthesia Other Findings  Echo 10/30/20: EF 60-65%, mild LVH, normal RV function, valves unremarkable  Reproductive/Obstetrics                            Anesthesia Physical Anesthesia Plan  ASA: III  Anesthesia Plan: General   Post-op Pain Management:    Induction: Intravenous  PONV Risk Score and Plan: 1 and Ondansetron, Dexamethasone, Midazolam and Treatment may vary due to age or medical condition  Airway Management Planned: LMA  Additional Equipment: None  Intra-op Plan:   Post-operative Plan: Extubation in OR  Informed Consent: I have reviewed the patients History and Physical, chart, labs and discussed the procedure including the risks, benefits and alternatives for the proposed anesthesia with the patient or authorized representative who has indicated his/her understanding and acceptance.     Dental advisory given  Plan Discussed with:   Anesthesia Plan Comments:        Anesthesia Quick Evaluation

## 2020-11-15 NOTE — Plan of Care (Signed)
  Problem: Health Behavior/Discharge Planning: Goal: Ability to manage health-related needs will improve Outcome: Progressing   Problem: Clinical Measurements: Goal: Ability to maintain clinical measurements within normal limits will improve Outcome: Progressing   Problem: Pain Managment: Goal: General experience of comfort will improve Outcome: Progressing   Problem: Safety: Goal: Ability to remain free from injury will improve Outcome: Progressing   

## 2020-11-15 NOTE — Plan of Care (Signed)
  Problem: Clinical Measurements: Goal: Diagnostic test results will improve Outcome: Progressing   Problem: Activity: Goal: Risk for activity intolerance will decrease Outcome: Progressing   Problem: Nutrition: Goal: Adequate nutrition will be maintained Outcome: Progressing   

## 2020-11-15 NOTE — TOC Progression Note (Signed)
Transition of Care [facility]) - Progression Note    Patient Details  Name: [name] [name] MRN: [id number] Date of Birth: [birth year]  Transition of Care [facility]) CM/SW Contact  Sharin Mons, RN Phone Number: 11/15/2020, 11:47 AM  Clinical Narrative:           - LEFT TRANSMETATARSAL AMPUTATION Placement of Prevena wound VAC, 3/18  PT/OT evaluations pending...   TOC team monitoring and will assist with TOC needs  Expected Discharge Plan: Home/Self Care Barriers to Discharge: Continued Medical Work up  Expected Discharge Plan and Services Expected Discharge Plan: Home/Self Care In-house Referral: Clinical Social Work     Living arrangements for the past 2 months: [place] Determinants of Health (SDOH) Interventions    Readmission Risk Interventions Readmission Risk Prevention Plan 11/14/2020  Transportation Screening Complete  HRI or [place] Complete  Social Work Consult for [place] Planning/Counseling Complete  Palliative Care Screening Not Applicable  Medication Review Press photographer) Complete  Some recent data might be hidden

## 2020-11-15 NOTE — CV Procedure (Signed)
LLE venous duplex was completed yesterday morning.  Results can be found under chart review under CV PROC. 11/15/2020 8:35 AM [place] Phifer RVT, RDMS

## 2020-11-15 NOTE — Anesthesia Postprocedure Evaluation (Signed)
Anesthesia Post Note  Patient: [name] [name]  Procedure(s) Performed: LEFT TRANSMETATARSAL AMPUTATION (Left Foot)     Patient location during evaluation: PACU Anesthesia Type: General Level of consciousness: awake and alert Pain management: pain level controlled Vital Signs Assessment: post-procedure vital signs reviewed and stable Respiratory status: spontaneous breathing, nonlabored ventilation and respiratory function stable Cardiovascular status: blood pressure returned to baseline and stable Postop Assessment: no apparent nausea or vomiting Anesthetic complications: no   No complications documented.  Last Vitals:  Vitals:   11/15/20 1005 11/15/20 1028  BP: 105/70 116/74  Pulse: 68 70  Resp: 14 15  Temp: 36.6 C 36.6 C  SpO2: 95% 99%    Last Pain:  Vitals:   11/15/20 1028  TempSrc: Oral  PainSc: 0-No pain                 Lidia Collum

## 2020-11-15 NOTE — Progress Notes (Signed)
PROGRESS NOTE    [name] [name]  DDU:[id number] DOB: 07-21-1961 DOA: 11/13/2020 PCP: [name], NP    Brief Narrative:  [name] [name] is a 60 y.o. male with a history of diabetes mellitus, hypertension, chronic hepatitis C, GERD. Patient presented secondary to worsening left foot wound. Empiric Vancomycin and cefepime started on admission. MRI obtained which confirmed osteomyelitis and abscess. Orthopedic surgery consulted.  He was transitioned to [facility] and underwent transmetatarsal amputation on 3/18   Assessment & Plan:   Principal Problem:   Osteomyelitis ([place]) Active Problems:   Chronic hepatitis C without hepatic coma (HCC)   Hepatic cirrhosis (HCC)   Type 2 diabetes mellitus with hyperlipidemia ([place])   Essential hypertension   Cellulitis of left foot   Cellulitis of lower extremity   Cellulitis   Foot abscess, left   Diabetic foot infection/abscess Left leg cellulitis Osteomyelitis/septic arthritis Patient started empirically on Vancomycin and Cefepime on admission. MRI of left foot significant for evidence of osteomyelitis of multiple sites in addition to new abscess. MRI of tibia/fibula significant for evidence of likely cellulitis. Mild leukocytosis. -Orthopedic surgery consulted -Patient was transferred to [facility] underwent transmetatarsal amputation on 3/18 -Continue Vancomycin and Cefepime -Start Flagyl -NPO after midnight  Diabetes mellitus, type 2 Patient is on Invokana and glipizide as an outpatient. Hemoglobin A1C of 11.2% from 2/22. Started on Lantus inpatient -Continue Lantuss 15 units daily in addition to Novolog 6 units TID qAC and SSI  Hyponatremia Mild. Sodium of 127 on admission  Likely contributed to by hyperglycemia and resultant pseudohyponatremia. Overall sodium has improved  Primary hypertension -Continue losartan  History of liver cirrhosis History of alcohol abuse Not currently drinking alcohol. On chart review,  ultrasound in 2016 suggested possible fibrosis. MRI of abdomen in 2018 suggested early cirrhosis with no mention of fibrosis. -Continue folate, multivitamin  Chronic anemia Chronic, mild and stable.  Hyperlipidemia -Continue Lipitor 40 mg daily  Depression -Continue Cymbalta, Remeron  GERD -Continue PPI (Protonix inpatient)  Dispo Patient reports that his living situation at a boarding house would not be conducive to appropriate wound healing with wound VAC Although his mother is involved in his care, she does not have adequate accommodations to care for him in the postop setting TOC consulted to see about possible placement since he has continued wound care needs with wound VAC   DVT prophylaxis: SCDs Start: 11/15/20 1032 heparin injection 5,000 Units Start: 11/13/20 2200  Code Status: DNR Family Communication: Updated patient's mother at the bedside Disposition Plan: Status is: Inpatient  Remains inpatient appropriate because:Inpatient level of care appropriate due to severity of illness   Dispo: The patient is from: [place]              Anticipated d/c is to: TBD              Patient currently is not medically stable to d/c.   Difficult to place patient No         Consultants:   Orthopedics, Dr. Sharol Given  Procedures:  LEFT TRANSMETATARSAL AMPUTATION Placement of Prevena wound VAC.  Antimicrobials:   Cefepime 3/17 >  Vancomycin 3/17 >  Flagyl 3/17 >   Subjective: He is having pain at his operative site.  He is anxious about his discharge disposition and how he will continue his post hospital care  Objective: Vitals:   11/15/20 0935 11/15/20 0950 11/15/20 1005 11/15/20 1028  BP: 97/64 114/72 105/70 116/74  Pulse: 71 76 68 70  Resp: 11 13 14 15   Temp: 97.6 F (36.4 C)  97.9 F (36.6 C) 97.9 F (36.6 C)  TempSrc:    Oral  SpO2: 95% 96% 95% 99%  Weight:      Height:        Intake/Output Summary (Last 24 hours) at 11/15/2020 1944 Last  data filed at 11/15/2020 1817 Gross per 24 hour  Intake 740 ml  Output 70 ml  Net 670 ml   Filed Weights   11/13/20 1546 11/14/20 0057 11/15/20 0842  Weight: 74.4 kg 74.5 kg 74.5 kg    Examination:  General exam: Appears calm and comfortable  Respiratory system: Clear to auscultation. Respiratory effort normal. Cardiovascular system: S1 & S2 heard, RRR. No JVD, murmurs, rubs, gallops or clicks. No pedal edema. Gastrointestinal system: Abdomen is nondistended, soft and nontender. No organomegaly or masses felt. Normal bowel sounds heard. Central nervous system: Alert and oriented. No focal neurological deficits. Extremities: Left foot is in dressing with wound VAC in place Skin: No rashes, lesions or ulcers Psychiatry: Judgement and insight appear normal. Mood & affect appropriate.     Data Reviewed: I have personally reviewed following labs and imaging studies  CBC: Recent Labs  Lab 11/13/20 1559 11/14/20 0307 11/15/20 0416  WBC 12.4* 13.6* 9.1  NEUTROABS 8.4* 8.0*  --   HGB 11.8* 11.5* 13.5  HCT 36.3* 35.4* 40.0  MCV 91.7 91.7 88.1  PLT 370 336 101   Basic Metabolic Panel: Recent Labs  Lab 11/13/20 1559 11/14/20 0307 11/15/20 0416  NA 127* 132* 131*  K 4.0 3.9 4.2  CL 95* 103 102  CO2 22 20* 21*  GLUCOSE 469* 314* 307*  BUN 12 11 8   CREATININE 0.75 0.57* 0.51*  CALCIUM 8.4* 8.2* 8.8*   GFR: Estimated Creatinine Clearance: 106.1 mL/min (A) (by C-G formula based on SCr of 0.51 mg/dL (L)). Liver Function Tests: Recent Labs  Lab 11/13/20 1559 11/14/20 0307 11/15/20 0416  AST 27 21 26   ALT 21 16 22   ALKPHOS 77 66 71  BILITOT 0.6 0.5 0.8  PROT 7.5 6.6 7.3  ALBUMIN 3.4* 2.8* 2.8*   No results for input(s): LIPASE, AMYLASE in the last 168 hours. No results for input(s): AMMONIA in the last 168 hours. Coagulation Profile: Recent Labs  Lab 11/15/20 0416  INR 1.0   Cardiac Enzymes: No results for input(s): CKTOTAL, CKMB, CKMBINDEX, TROPONINI in the  last 168 hours. BNP (last 3 results) No results for input(s): PROBNP in the last 8760 hours. HbA1C: No results for input(s): HGBA1C in the last 72 hours. CBG: Recent Labs  Lab 11/15/20 0750 11/15/20 0939 11/15/20 1432 11/15/20 1754 11/15/20 1942  GLUCAP 257* 259* 254* 279* 291*   Lipid Profile: No results for input(s): CHOL, HDL, LDLCALC, TRIG, CHOLHDL, LDLDIRECT in the last 72 hours. Thyroid Function Tests: No results for input(s): TSH, T4TOTAL, FREET4, T3FREE, THYROIDAB in the last 72 hours. Anemia Panel: No results for input(s): VITAMINB12, FOLATE, FERRITIN, TIBC, IRON, RETICCTPCT in the last 72 hours. Sepsis Labs: Recent Labs  Lab 11/13/20 1738  LATICACIDVEN 2.0*    Recent Results (from the past 240 hour(s))  SARS CORONAVIRUS 2 (TAT 6-24 HRS) Nasopharyngeal Nasopharyngeal Swab     Status: None   Collection Time: 11/13/20  9:37 PM   Specimen: Nasopharyngeal Swab  Result Value Ref Range Status   SARS Coronavirus 2 NEGATIVE NEGATIVE Final    Comment: (NOTE) SARS-CoV-2 target nucleic acids are NOT DETECTED.  The SARS-CoV-2 RNA is generally detectable  in upper and lower respiratory specimens during the acute phase of infection. Negative results do not preclude SARS-CoV-2 infection, do not rule out co-infections with other pathogens, and should not be used as the sole basis for treatment or other patient management decisions. Negative results must be combined with clinical observations, patient history, and epidemiological information. The expected result is Negative.  Fact Sheet for Patients: SugarRoll.be  Fact Sheet for Healthcare Providers: https://www.woods-mathews.com/  This test is not yet approved or cleared by the [place] FDA and  has been authorized for detection and/or diagnosis of SARS-CoV-2 by FDA under an Emergency Use Authorization (EUA). This EUA will remain  in effect (meaning this test can be used) for  the duration of the COVID-19 declaration under Se ction 564(b)(1) of the Act, 21 U.S.C. section 360bbb-3(b)(1), unless the authorization is terminated or revoked sooner.  Performed at [facility], [place] [address] [place], [place] [address]   MRSA PCR Screening     Status: None   Collection Time: 11/14/20 11:16 PM   Specimen: Nasal Mucosa; Nasopharyngeal  Result Value Ref Range Status   MRSA by PCR NEGATIVE NEGATIVE Final    Comment:        The GeneXpert MRSA Assay (FDA approved for NASAL specimens only), is one component of a comprehensive MRSA colonization surveillance program. It is not intended to diagnose MRSA infection nor to guide or monitor treatment for MRSA infections. Performed at [facility], [place] [address] [place], [place] [address]          Radiology Studies: MR TIBIA FIBULA LEFT WO CONTRAST  Result Date: 11/14/2020 CLINICAL DATA:  Left lower leg infection. EXAM: MRI OF LOWER LEFT EXTREMITY WITHOUT CONTRAST TECHNIQUE: Multiplanar, multisequence MR imaging of the left tibia and fibula was performed. No intravenous contrast was administered. COMPARISON:  None. FINDINGS: Bones/Joint/Cartilage No marrow signal abnormality. No fracture or dislocation. No joint effusion. Muscles and Tendons Intact.  No muscle edema or atrophy. Soft tissue Circumferential soft tissue swelling of the lower leg. No fluid collection or hematoma. No soft tissue mass. IMPRESSION: 1. Circumferential soft tissue swelling of the lower leg is nonspecific but may reflect cellulitis given clinical history. No abscess or osteomyelitis. Electronically Signed   By: Titus Dubin M.D.   On: 11/14/2020 11:11   MR FOOT LEFT WO CONTRAST  Result Date: 11/14/2020 CLINICAL DATA:  Great toe wound. EXAM: MRI OF THE LEFT FOOT WITHOUT CONTRAST TECHNIQUE: Multiplanar, multisequence MR imaging of the left foot was performed. No intravenous contrast was administered. COMPARISON:  Left foot x-rays  from yesterday. MRI left foot dated October 28, 2020. FINDINGS: Bones/Joint/Cartilage New abnormal marrow edema with corresponding decreased T1 marrow signal involving the first proximal and distal phalanges. There is also mild marrow edema in the first and second metatarsal heads as well as the second proximal phalanx. Small focus of early decreased T1 marrow signal in the plantar aspect of the second metatarsal head. Increased moderate first MTP joint effusion. No acute fracture or dislocation. Old healed fracture deformity of the second proximal phalanx. Ligaments Collateral ligaments are intact. Muscles and Tendons Flexor and extensor tendons are intact. No tenosynovitis. Increased T2 signal within the intrinsic muscles of the forefoot, nonspecific, but likely related to diabetic muscle changes. Soft tissue Progressive deep skin ulceration along the medial and plantar aspects of the great toe extending to the flexor hallucis longus tendon. There is a new 3.6 x 3.5 x 3.1 cm gas and fluid collection centered  in the first intermetatarsal space and extending dorsally (series 4, image 22). Diffuse soft tissue swelling of the great toe and forefoot. Skin blistering over the dorsal great toe. No soft tissue mass. IMPRESSION: 1. Progressive deep skin ulceration along the medial and plantar aspects of the great toe with new osteomyelitis of the first proximal and distal phalanges. New 3.6 x 3.5 x 3.1 cm abscess centered in the first intermetatarsal space. 2. Increased now moderate first MTP joint effusion with new mild marrow edema in the first metatarsal head, concerning for septic arthritis and early osteomyelitis. 3. Early osteomyelitis of the second metatarsal head which lies immediately adjacent to the abscess. New marrow edema within the second proximal phalanx is equivocal for osteomyelitis. Electronically Signed   By: Titus Dubin M.D.   On: 11/14/2020 11:08   VAS [place] LOWER EXTREMITY VENOUS (DVT)  Result  Date: 11/14/2020  Lower Venous DVT Study Indications: Edema, and Cellulitis.  Risk Factors: Recent hospital admission for sepsis from infected LLE DM foot ulcer. Comparison Study: No previous exams Performing Technologist: Rogelia Rohrer  Examination Guidelines: A complete evaluation includes B-mode imaging, spectral Doppler, color Doppler, and power Doppler as needed of all accessible portions of each vessel. Bilateral testing is considered an integral part of a complete examination. Limited examinations for reoccurring indications may be performed as noted. The reflux portion of the exam is performed with the patient in reverse Trendelenburg.  +-----+---------------+---------+-----------+----------+--------------+ RIGHTCompressibilityPhasicitySpontaneityPropertiesThrombus Aging +-----+---------------+---------+-----------+----------+--------------+ CFV  Full           Yes      Yes                                 +-----+---------------+---------+-----------+----------+--------------+   +---------+---------------+---------+-----------+----------+--------------+ LEFT     CompressibilityPhasicitySpontaneityPropertiesThrombus Aging +---------+---------------+---------+-----------+----------+--------------+ CFV      Full           Yes      Yes                                 +---------+---------------+---------+-----------+----------+--------------+ SFJ      Full                                                        +---------+---------------+---------+-----------+----------+--------------+ FV Prox  Full           Yes      Yes                                 +---------+---------------+---------+-----------+----------+--------------+ FV Mid   Full           Yes      Yes                                 +---------+---------------+---------+-----------+----------+--------------+ FV DistalFull           Yes      Yes                                  +---------+---------------+---------+-----------+----------+--------------+ PFV  Full                                                        +---------+---------------+---------+-----------+----------+--------------+ POP      Full           Yes      Yes                                 +---------+---------------+---------+-----------+----------+--------------+ PTV      Full                                                        +---------+---------------+---------+-----------+----------+--------------+ PERO     Full                                                        +---------+---------------+---------+-----------+----------+--------------+     Summary: RIGHT: - No evidence of common femoral vein obstruction.  LEFT: - There is no evidence of deep vein thrombosis in the lower extremity. - There is no evidence of chronic venous insufficiency.  - No cystic structure found in the popliteal fossa.  *See table(s) above for measurements and observations. Electronically signed by Servando Snare MD on 11/14/2020 at 5:59:02 PM.    Final         Scheduled Meds: . aspirin EC  81 mg Oral Daily  . atorvastatin  40 mg Oral Daily  . docusate sodium  100 mg Oral BID  . DULoxetine  60 mg Oral Daily  . folic acid  1 mg Oral Daily  . heparin  5,000 Units Subcutaneous Q8H  . insulin aspart      . insulin aspart  0-9 Units Subcutaneous TID WC  . insulin aspart  6 Units Subcutaneous TID WC  . insulin glargine  15 Units Subcutaneous QHS  . losartan  50 mg Oral Daily  . metroNIDAZOLE  500 mg Oral Q8H  . mirtazapine  15 mg Oral QHS  . multivitamin with minerals  1 tablet Oral Daily  . nicotine  7 mg Transdermal Daily  . saccharomyces boulardii  250 mg Oral BID   Continuous Infusions: . sodium chloride 75 mL/hr at 11/15/20 1242  . ceFEPime (MAXIPIME) IV 2 g (11/15/20 1817)  . lactated ringers 10 mL/hr at 11/15/20 0900  . vancomycin 1,250 mg (11/15/20 1246)     LOS: 2 days     Time spent: 65mins    Kathie Dike, MD [facility]   If 7PM-7AM, please contact night-coverage [email or web address]  11/15/2020, 7:44 PM

## 2020-11-16 ENCOUNTER — Encounter (HOSPITAL_COMMUNITY): Payer: Self-pay | Admitting: Orthopedic Surgery

## 2020-11-16 LAB — C DIFFICILE QUICK SCREEN W PCR REFLEX
C Diff antigen: NEGATIVE
C Diff interpretation: NOT DETECTED
C Diff toxin: NEGATIVE

## 2020-11-16 LAB — BASIC METABOLIC PANEL
Anion gap: 3 — ABNORMAL LOW (ref 5–15)
BUN: 9 mg/dL (ref 6–20)
CO2: 25 mmol/L (ref 22–32)
Calcium: 8.2 mg/dL — ABNORMAL LOW (ref 8.9–10.3)
Chloride: 103 mmol/L (ref 98–111)
Creatinine, Ser: 0.58 mg/dL — ABNORMAL LOW (ref 0.61–1.24)
GFR, Estimated: >60 mL/min (ref >60)
Glucose, Bld: 268 mg/dL — ABNORMAL HIGH (ref 70–99)
Potassium: 3.8 mmol/L (ref 3.5–5.1)
Sodium: 131 mmol/L — ABNORMAL LOW (ref 135–145)

## 2020-11-16 LAB — CBC
HCT: 37.4 % — ABNORMAL LOW (ref 39.0–52.0)
Hemoglobin: 12.4 g/dL — ABNORMAL LOW (ref 13.0–17.0)
MCH: 29.7 pg (ref 26.0–34.0)
MCHC: 33.2 g/dL (ref 30.0–36.0)
MCV: 89.7 fL (ref 80.0–100.0)
Platelets: 357 10*3/uL (ref 150–400)
RBC: 4.17 MIL/uL — ABNORMAL LOW (ref 4.22–5.81)
RDW: 12.5 % (ref 11.5–15.5)
WBC: 13.3 10*3/uL — ABNORMAL HIGH (ref 4.0–10.5)
nRBC: 0 % (ref 0.0–0.2)

## 2020-11-16 LAB — GLUCOSE, CAPILLARY
Glucose-Capillary: 234 mg/dL — ABNORMAL HIGH (ref 70–99)
Glucose-Capillary: 322 mg/dL — ABNORMAL HIGH (ref 70–99)
Glucose-Capillary: 163 mg/dL — ABNORMAL HIGH (ref 70–99)
Glucose-Capillary: 165 mg/dL — ABNORMAL HIGH (ref 70–99)

## 2020-11-16 MED ORDER — SODIUM CHLORIDE 0.9 % IV SOLN: INTRAVENOUS | Status: DC

## 2020-11-16 MED ORDER — INSULIN GLARGINE 100 UNIT/ML ~~LOC~~ SOLN
25.0000 [IU] | Freq: Every day | SUBCUTANEOUS | Status: DC
  Administered 2020-11-16: 25 [IU] via SUBCUTANEOUS
  Filled 2020-11-16 (×2): qty 0.25

## 2020-11-16 MED ORDER — INSULIN ASPART 100 UNIT/ML ~~LOC~~ SOLN
0.0000 [IU] | Freq: Every day | SUBCUTANEOUS | Status: DC
  Administered 2020-11-17 – 2020-11-18 (×2): 3 [IU] via SUBCUTANEOUS
  Administered 2020-11-19: 4 [IU] via SUBCUTANEOUS
  Administered 2020-11-20 – 2020-11-21 (×2): 3 [IU] via SUBCUTANEOUS
  Administered 2020-11-22: 2 [IU] via SUBCUTANEOUS
  Administered 2020-11-23: 4 [IU] via SUBCUTANEOUS
  Administered 2020-11-24: 3 [IU] via SUBCUTANEOUS
  Administered 2020-11-25: 2 [IU] via SUBCUTANEOUS

## 2020-11-16 MED ORDER — INSULIN ASPART 100 UNIT/ML ~~LOC~~ SOLN
9.0000 [IU] | Freq: Three times a day (TID) | SUBCUTANEOUS | Status: DC
  Administered 2020-11-16 – 2020-11-20 (×13): 9 [IU] via SUBCUTANEOUS

## 2020-11-16 MED ORDER — INSULIN ASPART 100 UNIT/ML ~~LOC~~ SOLN
0.0000 [IU] | Freq: Three times a day (TID) | SUBCUTANEOUS | Status: DC
  Administered 2020-11-16: 4 [IU] via SUBCUTANEOUS
  Administered 2020-11-17: 11 [IU] via SUBCUTANEOUS
  Administered 2020-11-17: 3 [IU] via SUBCUTANEOUS
  Administered 2020-11-17: 7 [IU] via SUBCUTANEOUS
  Administered 2020-11-18: 3 [IU] via SUBCUTANEOUS
  Administered 2020-11-18: 4 [IU] via SUBCUTANEOUS
  Administered 2020-11-18 – 2020-11-19 (×2): 11 [IU] via SUBCUTANEOUS
  Administered 2020-11-19: 4 [IU] via SUBCUTANEOUS
  Administered 2020-11-19: 7 [IU] via SUBCUTANEOUS
  Administered 2020-11-20: 20 [IU] via SUBCUTANEOUS
  Administered 2020-11-20: 7 [IU] via SUBCUTANEOUS
  Administered 2020-11-20: 4 [IU] via SUBCUTANEOUS
  Administered 2020-11-21: 20 [IU] via SUBCUTANEOUS
  Administered 2020-11-21 (×2): 11 [IU] via SUBCUTANEOUS
  Administered 2020-11-22: 7 [IU] via SUBCUTANEOUS
  Administered 2020-11-22: 11 [IU] via SUBCUTANEOUS
  Administered 2020-11-22: 15 [IU] via SUBCUTANEOUS
  Administered 2020-11-23: 11 [IU] via SUBCUTANEOUS
  Administered 2020-11-23: 7 [IU] via SUBCUTANEOUS
  Administered 2020-11-23: 11 [IU] via SUBCUTANEOUS
  Administered 2020-11-24: 4 [IU] via SUBCUTANEOUS
  Administered 2020-11-24: 20 [IU] via SUBCUTANEOUS
  Administered 2020-11-24 – 2020-11-25 (×2): 4 [IU] via SUBCUTANEOUS
  Administered 2020-11-25: 7 [IU] via SUBCUTANEOUS
  Administered 2020-11-25: 11 [IU] via SUBCUTANEOUS
  Administered 2020-11-26: 4 [IU] via SUBCUTANEOUS

## 2020-11-16 NOTE — Progress Notes (Signed)
PROGRESS NOTE    [name] [name]  YJE:[id number] DOB: 1960-10-18 DOA: 11/13/2020 PCP: [name], NP    Brief Narrative:  [name] [name] is a 60 y.o. male with a history of diabetes mellitus, hypertension, chronic hepatitis C, GERD. Patient presented secondary to worsening left foot wound. Empiric Vancomycin and cefepime started on admission. MRI obtained which confirmed osteomyelitis and abscess. Orthopedic surgery consulted.  He was transitioned to [facility] and underwent transmetatarsal amputation on 3/18   Assessment & Plan:   Principal Problem:   Osteomyelitis ([place]) Active Problems:   Chronic hepatitis C without hepatic coma (HCC)   Hepatic cirrhosis (HCC)   Type 2 diabetes mellitus with hyperlipidemia ([place])   Essential hypertension   Cellulitis of left foot   Cellulitis of lower extremity   Cellulitis   Foot abscess, left   Diabetic foot infection/abscess Left leg cellulitis Osteomyelitis/septic arthritis Patient started empirically on Vancomycin and Cefepime on admission. MRI of left foot significant for evidence of osteomyelitis of multiple sites in addition to new abscess. MRI of tibia/fibula significant for evidence of likely cellulitis. Mild leukocytosis. -Orthopedic surgery consulted -Patient was transferred to [facility] underwent transmetatarsal amputation on 3/18 -Continue Vancomycin and Cefepime -intraoperative culture positive for pseudomonas and staph epi -discontinue flagyl -continue vanc/cefepime for now  Diabetes mellitus, type 2 Patient is on Invokana and glipizide as an outpatient. Hemoglobin A1C of 11.2% from 2/22. Started on Lantus inpatient -Blood sugars remain elevated -Increased lantus to 25 units daily and novolog to 9 units with meals -increased SSI to resistant   Hyponatremia Mild. Sodium of 127 on admission  Likely contributed to by hyperglycemia and resultant pseudohyponatremia. Overall sodium has improved  Primary  hypertension -Continue losartan  History of liver cirrhosis History of alcohol abuse Not currently drinking alcohol. On chart review, ultrasound in 2016 suggested possible fibrosis. MRI of abdomen in 2018 suggested early cirrhosis with no mention of fibrosis. -Continue folate, multivitamin  Chronic anemia Chronic, mild and stable.  Hyperlipidemia -Continue Lipitor 40 mg daily  Depression -Continue Cymbalta, Remeron  GERD -Continue PPI (Protonix inpatient)  Dispo Patient reports that his living situation at a boarding house would not be conducive to appropriate wound healing with wound VAC Although his mother is involved in his care, she does not have adequate accommodations to care for him in the postop setting TOC consulted to see about possible placement since he has continued wound care needs with wound VAC Seen by PT/OT with recommendations for SNF   DVT prophylaxis: SCDs Start: 11/15/20 1032 heparin injection 5,000 Units Start: 11/13/20 2200  Code Status: DNR Family Communication: Updated patient's mother at the bedside Disposition Plan: Status is: Inpatient  Remains inpatient appropriate because:Inpatient level of care appropriate due to severity of illness   Dispo: The patient is from: [place]              Anticipated d/c is to: TBD              Patient currently is not medically stable to d/c.   Difficult to place patient No     Consultants:   Orthopedics, Dr. Sharol Given  Procedures:  LEFT TRANSMETATARSAL AMPUTATION Placement of Prevena wound VAC.  Antimicrobials:   Cefepime 3/17 >  Vancomycin 3/17 >  Flagyl 3/17 >319   Subjective: Overall loose stools are better. Still having some pain at the operative site  Objective: Vitals:   11/16/20 0013 11/16/20 0400 11/16/20 0848 11/16/20 1300  BP: 123/77 (!) 143/80 126/67  117/73  Pulse: 78 76 73 80  Resp: 16 17 17 17   Temp: 98 F (36.7 C) 98.8 F (37.1 C) 98.1 F (36.7 C) 98.5 F (36.9  C)  TempSrc: Oral Oral Oral Oral  SpO2: 98% 99% 97% 100%  Weight:      Height:        Intake/Output Summary (Last 24 hours) at 11/16/2020 1630 Last data filed at 11/15/2020 1817 Gross per 24 hour  Intake 240 ml  Output 20 ml  Net 220 ml   Filed Weights   11/13/20 1546 11/14/20 0057 11/15/20 0842  Weight: 74.4 kg 74.5 kg 74.5 kg    Examination:  General exam: Appears calm and comfortable  Respiratory system: Clear to auscultation. Respiratory effort normal. Cardiovascular system: S1 & S2 heard, RRR. No JVD, murmurs, rubs, gallops or clicks. No pedal edema. Gastrointestinal system: Abdomen is nondistended, soft and nontender. No organomegaly or masses felt. Normal bowel sounds heard. Central nervous system: Alert and oriented. No focal neurological deficits. Extremities: Left foot is in dressing with wound VAC in place Skin: No rashes, lesions or ulcers Psychiatry: Judgement and insight appear normal. Mood & affect appropriate.     Data Reviewed: I have personally reviewed following labs and imaging studies  CBC: Recent Labs  Lab 11/13/20 1559 11/14/20 0307 11/15/20 0416 11/16/20 0123  WBC 12.4* 13.6* 9.1 13.3*  NEUTROABS 8.4* 8.0*  --   --   HGB 11.8* 11.5* 13.5 12.4*  HCT 36.3* 35.4* 40.0 37.4*  MCV 91.7 91.7 88.1 89.7  PLT 370 336 381 846   Basic Metabolic Panel: Recent Labs  Lab 11/13/20 1559 11/14/20 0307 11/15/20 0416 11/16/20 0123  NA 127* 132* 131* 131*  K 4.0 3.9 4.2 3.8  CL 95* 103 102 103  CO2 22 20* 21* 25  GLUCOSE 469* 314* 307* 268*  BUN 12 11 8 9   CREATININE 0.75 0.57* 0.51* 0.58*  CALCIUM 8.4* 8.2* 8.8* 8.2*   GFR: Estimated Creatinine Clearance: 106.1 mL/min (A) (by C-G formula based on SCr of 0.58 mg/dL (L)). Liver Function Tests: Recent Labs  Lab 11/13/20 1559 11/14/20 0307 11/15/20 0416  AST 27 21 26   ALT 21 16 22   ALKPHOS 77 66 71  BILITOT 0.6 0.5 0.8  PROT 7.5 6.6 7.3  ALBUMIN 3.4* 2.8* 2.8*   No results for input(s):  LIPASE, AMYLASE in the last 168 hours. No results for input(s): AMMONIA in the last 168 hours. Coagulation Profile: Recent Labs  Lab 11/15/20 0416  INR 1.0   Cardiac Enzymes: No results for input(s): CKTOTAL, CKMB, CKMBINDEX, TROPONINI in the last 168 hours. BNP (last 3 results) No results for input(s): PROBNP in the last 8760 hours. HbA1C: No results for input(s): HGBA1C in the last 72 hours. CBG: Recent Labs  Lab 11/15/20 1432 11/15/20 1754 11/15/20 1942 11/16/20 0654 11/16/20 1112  GLUCAP 254* 279* 291* 234* 322*   Lipid Profile: No results for input(s): CHOL, HDL, LDLCALC, TRIG, CHOLHDL, LDLDIRECT in the last 72 hours. Thyroid Function Tests: No results for input(s): TSH, T4TOTAL, FREET4, T3FREE, THYROIDAB in the last 72 hours. Anemia Panel: No results for input(s): VITAMINB12, FOLATE, FERRITIN, TIBC, IRON, RETICCTPCT in the last 72 hours. Sepsis Labs: Recent Labs  Lab 11/13/20 1738  LATICACIDVEN 2.0*    Recent Results (from the past 240 hour(s))  SARS CORONAVIRUS 2 (TAT 6-24 HRS) Nasopharyngeal Nasopharyngeal Swab     Status: None   Collection Time: 11/13/20  9:37 PM   Specimen: Nasopharyngeal Swab  Result  Value Ref Range Status   SARS Coronavirus 2 NEGATIVE NEGATIVE Final    Comment: (NOTE) SARS-CoV-2 target nucleic acids are NOT DETECTED.  The SARS-CoV-2 RNA is generally detectable in upper and lower respiratory specimens during the acute phase of infection. Negative results do not preclude SARS-CoV-2 infection, do not rule out co-infections with other pathogens, and should not be used as the sole basis for treatment or other patient management decisions. Negative results must be combined with clinical observations, patient history, and epidemiological information. The expected result is Negative.  Fact Sheet for Patients: SugarRoll.be  Fact Sheet for Healthcare Providers: https://www.woods-mathews.com/  This  test is not yet approved or cleared by the [place] FDA and  has been authorized for detection and/or diagnosis of SARS-CoV-2 by FDA under an Emergency Use Authorization (EUA). This EUA will remain  in effect (meaning this test can be used) for the duration of the COVID-19 declaration under Se ction 564(b)(1) of the Act, 21 U.S.C. section 360bbb-3(b)(1), unless the authorization is terminated or revoked sooner.  Performed at [facility], [place] [address] [place], [place] [address]   MRSA PCR Screening     Status: None   Collection Time: 11/14/20 11:16 PM   Specimen: Nasal Mucosa; Nasopharyngeal  Result Value Ref Range Status   MRSA by PCR NEGATIVE NEGATIVE Final    Comment:        The GeneXpert MRSA Assay (FDA approved for NASAL specimens only), is one component of a comprehensive MRSA colonization surveillance program. It is not intended to diagnose MRSA infection nor to guide or monitor treatment for MRSA infections. Performed at [facility], [place] [address], [place], [place] [address]   Aerobic/Anaerobic Culture w Gram Stain (surgical/deep wound)     Status: None (Preliminary result)   Collection Time: 11/15/20  9:20 AM   Specimen: PATH Other; Tissue  Result Value Ref Range Status   Specimen Description TISSUE  Final   Special Requests LEFT FOOT SPEC A  Final   Gram Stain PENDING  Incomplete   Culture   Final    ABUNDANT PSEUDOMONAS AERUGINOSA ABUNDANT STAPHYLOCOCCUS EPIDERMIDIS SUSCEPTIBILITIES TO FOLLOW Performed at [facility], [place] [address] [place], [place] [address]    Report Status PENDING  Incomplete  C Difficile Quick Screen w PCR reflex     Status: None   Collection Time: 11/16/20  1:17 AM   Specimen: STOOL  Result Value Ref Range Status   C Diff antigen NEGATIVE NEGATIVE Final   C Diff toxin NEGATIVE NEGATIVE Final   C Diff interpretation No C. difficile detected.  Final    Comment: Performed at [facility], [place]  [address] [place], [place] [address]         Radiology Studies: No results found.      Scheduled Meds: . aspirin EC  81 mg Oral Daily  . atorvastatin  40 mg Oral Daily  . docusate sodium  100 mg Oral BID  . DULoxetine  60 mg Oral Daily  . folic acid  1 mg Oral Daily  . heparin  5,000 Units Subcutaneous Q8H  . insulin aspart  0-9 Units Subcutaneous TID WC  . insulin aspart  9 Units Subcutaneous TID WC  . insulin glargine  25 Units Subcutaneous QHS  . losartan  50 mg Oral Daily  . metroNIDAZOLE  500 mg Oral Q8H  . mirtazapine  15 mg Oral QHS  . multivitamin with minerals  1 tablet Oral  Daily  . nicotine  7 mg Transdermal Daily  . saccharomyces boulardii  250 mg Oral BID   Continuous Infusions: . sodium chloride 75 mL/hr at 11/15/20 1242  . ceFEPime (MAXIPIME) IV 2 g (11/16/20 1210)  . lactated ringers 10 mL/hr at 11/15/20 0900  . vancomycin 1,250 mg (11/16/20 1210)     LOS: 3 days    Time spent: 42mins    Kathie Dike, MD [facility]   If 7PM-7AM, please contact night-coverage [email or web address]  11/16/2020, 4:30 PM

## 2020-11-16 NOTE — Progress Notes (Signed)
C-Diff test results negative.  Enteric precautions discontinued.

## 2020-11-16 NOTE — TOC Progression Note (Signed)
Transition of Care [facility]) - Progression Note    Patient Details  Name: [name] [name] MRN: [id number] Date of Birth: Aug 09, 1961  Transition of Care [facility]) CM/SW [place], [place] Phone Number: 11/16/2020, 11:49 AM  Clinical Narrative:    CSW was notified by nurse that pt and mother would like to talk about SNF options. CSW met with them at bedside and advised that therapies had not evaluated him yet, so CSW was unsure if he would qualify for SNF. Pt noted that he lives in boarding/ apartment house, that is difficult to navigate and has stairs. Pt's mother lives at [facility], but only has one room, so that is not an option.  Pt has no insurance, CSW asked if he had started Medicaid, he said he had all the paperwork, but had not signed it. CSW left a voicemail for financial counseling to review pt. He was updated about the many barriers to possible SNF placement.  Pt has both Pfizer vaccines and a booster. SW will follow for PT recommendations.   Expected Discharge Plan: Home/Self Care Barriers to Discharge: Continued Medical Work up  Expected Discharge Plan and Services Expected Discharge Plan: Home/Self Care In-house Referral: Clinical Social Work     Living arrangements for the past 2 months: [place] Determinants of Health (SDOH) Interventions    Readmission Risk Interventions Readmission Risk Prevention Plan 11/14/2020  Transportation Screening Complete  HRI or [place] Complete  Social Work Consult for [place] Planning/Counseling Complete  Palliative Care Screening Not Applicable  Medication Review Press photographer) Complete  Some recent data might be hidden

## 2020-11-16 NOTE — Progress Notes (Signed)
Patient ID: [name] [name], male   DOB: 1961-01-19, 60 y.o.   MRN: [id number] Postoperative day 1 transmetatarsal amputation left foot.  Patient has no complaints there is 25 cc in the wound VAC canister.  Patient expresses interest for discharge to skilled nursing he states that his home is not set up for him to be able to perform his activities of daily living at home.

## 2020-11-16 NOTE — Evaluation (Signed)
Physical Therapy Evaluation Patient Details Name: [name] [name] MRN: [id number] DOB: August 18, 1961 Today's Date: 11/16/2020   History of Present Illness  Patient is a 60 y/o male presented to ED from wound care center for concerns of cellulitis of L great toe with ulceration and uncontrolled diabetes. Patient now s/p L transmetatarsal amputation on 3/18. PMH: depression, uncontrolled DM, GERD, hep C, neuropathy  Clinical Impression  PTA, patient lives in boarding house and independent with mobility. Patient presents with generalized weakness, impaired balance, and impaired functional mobility. Patient supervision-min guard for OOB mobility with RW and able to maintain NWB throughout session. Patient will benefit from skilled PT services during acute stay to address listed deficits. Patient requests short term rehab as he anticipates not being able to navigate 1-2 stairs to access home environment. Anticipate patient continue to mobilize well and would be able to return home with HHPT as he is currently at supervision-min guard level for mobility with RW.     Follow Up Recommendations SNF;Supervision for mobility/OOB    Equipment Recommendations  Rolling Arissa Fagin with 5" wheels;3in1 (PT);Wheelchair (measurements PT);Wheelchair cushion (measurements PT)    Recommendations for Other Services       Precautions / Restrictions Precautions Precautions: Fall Required Braces or Orthoses: Other Brace Other Brace: has DARCO shoe in room previously owned Restrictions Weight Bearing Restrictions: Yes LLE Weight Bearing: Non weight bearing      Mobility  Bed Mobility Overal bed mobility: Modified Independent                  Transfers Overall transfer level: Needs assistance Equipment used: Rolling Colyn Miron (2 wheeled) Transfers: Sit to/from Stand Sit to Stand: Supervision         General transfer comment: supervision for safety  Ambulation/Gait Ambulation/Gait assistance: Min  guard Gait Distance (Feet): 30 Feet Assistive device: Rolling Adorian Gwynne (2 wheeled) Gait Pattern/deviations:  (hop to)     General Gait Details: patient able to maintain NWB throughout ambulation, unsteady at times, however patient able to self correct  Stairs            Wheelchair Mobility    Modified Rankin (Stroke Patients Only)       Balance Overall balance assessment: Mild deficits observed, not formally tested                                           Pertinent Vitals/Pain Pain Assessment: Faces Faces Pain Scale: Hurts a little bit Pain Location: L foot Pain Descriptors / Indicators: Grimacing;Guarding Pain Intervention(s): Monitored during session;Repositioned    Home Living Family/patient expects to be discharged to:: Private residence Living Arrangements: Alone (in a boarding house) Available Help at Discharge: Friend(s);Available PRN/intermittently Type of Home: House Home Access: Stairs to enter Entrance Stairs-Rails: None Entrance Stairs-Number of Steps: side door 2 STE, 1 step throuhgout house to access room Home Layout: One level Home Equipment: Environmental consultant - 2 wheels      Prior Function Level of Independence: Independent         Comments: Diplomatic Services operational officer        Extremity/Trunk Assessment   Upper Extremity Assessment Upper Extremity Assessment: Defer to OT evaluation    Lower Extremity Assessment Lower Extremity Assessment: Overall WFL for tasks assessed;LLE deficits/detail LLE: Unable to fully assess due to immobilization       Communication   Communication:  No difficulties  Cognition Arousal/Alertness: Awake/alert Behavior During Therapy: Impulsive Overall Cognitive Status: Within Functional Limits for tasks assessed                                 General Comments: impulsive at times      General Comments      Exercises     Assessment/Plan    PT Assessment Patient  needs continued PT services  PT Problem List Decreased strength;Decreased activity tolerance;Decreased balance;Decreased mobility;Decreased safety awareness;Decreased knowledge of precautions       PT Treatment Interventions DME instruction;Gait training;Stair training;Functional mobility training;Therapeutic activities;Therapeutic exercise;Balance training;Patient/family education    PT Goals (Current goals can be found in the Care Plan section)  Acute Rehab PT Goals Patient Stated Goal: to protect my foot PT Goal Formulation: With patient Time For Goal Achievement: 11/30/20 Potential to Achieve Goals: Good    Frequency Min 5X/week   Barriers to discharge        Co-evaluation               AM-PAC PT "6 Clicks" Mobility  Outcome Measure Help needed turning from your back to your side while in a flat bed without using bedrails?: None Help needed moving from lying on your back to sitting on the side of a flat bed without using bedrails?: None Help needed moving to and from a bed to a chair (including a wheelchair)?: A Little Help needed standing up from a chair using your arms (e.g., wheelchair or bedside chair)?: A Little Help needed to walk in hospital room?: A Little Help needed climbing 3-5 steps with a railing? : A Little 6 Click Score: 20    End of Session   Activity Tolerance: Patient tolerated treatment well Patient left: in chair;with call bell/phone within reach Nurse Communication: Mobility status PT Visit Diagnosis: Unsteadiness on feet (R26.81);Muscle weakness (generalized) (M62.81);Other abnormalities of gait and mobility (R26.89)    Time: 4782-9562 PT Time Calculation (min) (ACUTE ONLY): 18 min   Charges:   PT Evaluation $PT Eval Low Complexity: 1 Low          Lynnmarie Lovett A. Gilford Rile PT, DPT Acute Rehabilitation Services Pager [phone] Office [phone]   Linna Hoff 11/16/2020, 12:10 PM

## 2020-11-16 NOTE — Evaluation (Signed)
Occupational Therapy Evaluation Patient Details Name: [name] [name] MRN: [id number] DOB: 1961/07/13 Today's Date: 11/16/2020    History of Present Illness Patient is a 60 y/o male presented to ED from wound care center for concerns of cellulitis of L great toe with ulceration and uncontrolled diabetes. Patient now s/p L transmetatarsal amputation on 3/18. PMH: depression, uncontrolled DM, GERD, hep C, neuropathy   Clinical Impression   Pt presents with decline in function and safety with ADLs and ADL mobility with impaired balance and endurance. PTA pt lived at a boarding house and was Ind with ADLs/selfcare and ADL mobility. Pt currently requires min A with LB selfcare, up with mobility using RW; pt impulsive during ADL mobility. . Patient requests ST SNF rehab as he concerned about  not being able to navigate 1-2 stairs to access home environment and not having any assist with LB selfcare. Pt would benefit from acute OT services to address impairments to maximize level of function and safety    Follow Up Recommendations  SNF    Equipment Recommendations  3 in 1 bedside commode;Wheelchair (measurements OT);Wheelchair cushion (measurements OT);Other (comment) (RW, reacher)    Recommendations for Other Services       Precautions / Restrictions Precautions Precautions: Fall Required Braces or Orthoses: Other Brace Other Brace: has DARCO shoe in room previously owned Restrictions Weight Bearing Restrictions: Yes LLE Weight Bearing: Non weight bearing      Mobility Bed Mobility Overal bed mobility: Modified Independent                  Transfers Overall transfer level: Needs assistance Equipment used: Rolling walker (2 wheeled) Transfers: Sit to/from Stand Sit to Stand: Supervision         General transfer comment: supervision for safety    Balance Overall balance assessment: Mild deficits observed, not formally tested                                          ADL either performed or assessed with clinical judgement   ADL Overall ADL's : Needs assistance/impaired Eating/Feeding: Independent;Sitting   Grooming: Wash/dry face;Wash/dry hands;Min guard;Standing   Upper Body Bathing: Set up;Independent;Sitting   Lower Body Bathing: Minimal assistance   Upper Body Dressing : Set up;Independent;Sitting   Lower Body Dressing: Minimal assistance   Toilet Transfer: Supervision/safety;Cueing for safety;RW;Ambulation   Toileting- Clothing Manipulation and Hygiene: Min guard       Functional mobility during ADLs: Supervision/safety;Cueing for safety;Rolling walker       Vision Patient Visual Report: No change from baseline       Perception     Praxis      Pertinent Vitals/Pain Pain Assessment: Faces Faces Pain Scale: Hurts a little bit Pain Location: L foot Pain Descriptors / Indicators: Grimacing;Guarding Pain Intervention(s): Monitored during session;Repositioned     Hand Dominance Right   Extremity/Trunk Assessment Upper Extremity Assessment Upper Extremity Assessment: Overall WFL for tasks assessed   Lower Extremity Assessment Lower Extremity Assessment: Overall WFL for tasks assessed;LLE deficits/detail LLE: Unable to fully assess due to immobilization       Communication Communication Communication: No difficulties   Cognition Arousal/Alertness: Awake/alert Behavior During Therapy: Impulsive Overall Cognitive Status: Within Functional Limits for tasks assessed  General Comments: impulsive at times   General Comments       Exercises     Shoulder Instructions      Home Living Family/patient expects to be discharged to:: Private residence [facility]) Living Arrangements: Alone Available Help at Discharge: Friend(s);Available PRN/intermittently Type of Home: House Home Access: Stairs to enter [employer] of Steps: side door 2 STE,  1 step throuhgout house to access room Entrance Stairs-Rails: None Home Layout: One level     Bathroom Shower/Tub: Teacher, early years/pre: Standard Bathroom Accessibility: Yes   Home Equipment: Environmental consultant - 2 wheels   Additional Comments: pt poor historian so uncertain how much home/environment and PLOF is accurate. He reports he lives with his mother and has to help her physically with mobiltiy.       Prior Functioning/Environment Level of Independence: Independent        Comments: Nature conservation officer        OT Problem List: Impaired balance (sitting and/or standing);Decreased activity tolerance;Decreased knowledge of use of DME or AE      OT Treatment/Interventions: Self-care/ADL training;Patient/family education;Balance training;Therapeutic activities;DME and/or AE instruction    OT Goals(Current goals can be found in the care plan section) Acute Rehab OT Goals Patient Stated Goal: to protect my foot OT Goal Formulation: With patient Time For Goal Achievement: 11/30/20 Potential to Achieve Goals: Good ADL Goals Pt Will Perform Grooming: with supervision;with set-up;with modified independence;standing Pt Will Perform Lower Body Bathing: with min guard assist;with supervision Pt Will Perform Lower Body Dressing: with min guard assist;with supervision Pt Will Transfer to Toilet: with modified independence;ambulating;regular height toilet;bedside commode Pt Will Perform Toileting - Clothing Manipulation and hygiene: with supervision;with modified independence;sit to/from stand  OT Frequency: Min 2X/week   Barriers to D/C:            Co-evaluation              AM-PAC OT "6 Clicks" Daily Activity     Outcome Measure Help from another person eating meals?: None Help from another person taking care of personal grooming?: A Little Help from another person toileting, which includes using toliet, bedpan, or urinal?: A Little Help from another person bathing  (including washing, rinsing, drying)?: A Little Help from another person to put on and taking off regular upper body clothing?: None Help from another person to put on and taking off regular lower body clothing?: A Little 6 Click Score: 20   End of Session Equipment Utilized During Treatment: Rolling walker;Other (comment) (BSC)  Activity Tolerance: Patient tolerated treatment well Patient left: in chair;with call bell/phone within reach  OT Visit Diagnosis: Unsteadiness on feet (R26.81);Other abnormalities of gait and mobility (R26.89);Pain Pain - Right/Left: Left Pain - part of body: Ankle and joints of foot                Time: 1696-7893 OT Time Calculation (min): 24 min Charges:  OT General Charges $OT Visit: 1 Visit OT Evaluation $OT Eval Moderate Complexity: 1 Mod    Britt Bottom 11/16/2020, 12:35 PM

## 2020-11-17 LAB — GLUCOSE, CAPILLARY
Glucose-Capillary: 255 mg/dL — ABNORMAL HIGH (ref 70–99)
Glucose-Capillary: 237 mg/dL — ABNORMAL HIGH (ref 70–99)
Glucose-Capillary: 134 mg/dL — ABNORMAL HIGH (ref 70–99)
Glucose-Capillary: 284 mg/dL — ABNORMAL HIGH (ref 70–99)

## 2020-11-17 LAB — CBC
HCT: 38.5 % — ABNORMAL LOW (ref 39.0–52.0)
Hemoglobin: 12.7 g/dL — ABNORMAL LOW (ref 13.0–17.0)
MCH: 29.5 pg (ref 26.0–34.0)
MCHC: 33 g/dL (ref 30.0–36.0)
MCV: 89.3 fL (ref 80.0–100.0)
Platelets: 360 10*3/uL (ref 150–400)
RBC: 4.31 MIL/uL (ref 4.22–5.81)
RDW: 12.5 % (ref 11.5–15.5)
WBC: 9.4 10*3/uL (ref 4.0–10.5)
nRBC: 0 % (ref 0.0–0.2)

## 2020-11-17 LAB — BASIC METABOLIC PANEL
Anion gap: 5 (ref 5–15)
BUN: 8 mg/dL (ref 6–20)
CO2: 27 mmol/L (ref 22–32)
Calcium: 8.5 mg/dL — ABNORMAL LOW (ref 8.9–10.3)
Chloride: 101 mmol/L (ref 98–111)
Creatinine, Ser: 0.45 mg/dL — ABNORMAL LOW (ref 0.61–1.24)
GFR, Estimated: >60 mL/min (ref >60)
Glucose, Bld: 229 mg/dL — ABNORMAL HIGH (ref 70–99)
Potassium: 4.1 mmol/L (ref 3.5–5.1)
Sodium: 133 mmol/L — ABNORMAL LOW (ref 135–145)

## 2020-11-17 MED ORDER — INSULIN GLARGINE 100 UNIT/ML ~~LOC~~ SOLN
30.0000 [IU] | Freq: Every day | SUBCUTANEOUS | Status: DC
  Administered 2020-11-17 – 2020-11-18 (×2): 30 [IU] via SUBCUTANEOUS
  Filled 2020-11-17 (×3): qty 0.3

## 2020-11-17 NOTE — Progress Notes (Signed)
Pharmacy Antibiotic Note  [name] [name] is a 60 y.o. male with a history of diabetes admitted on 11/13/2020 with worsening left foot wound. Pharmacy has been consulted for vancomycin and cefepime dosing.  MRI on 3/17 reveal osteomyelitis and patient had left transmetatarsal amputation 3/18 with plans to continue antibiotic therapy for 4 weeks.  Patient is afebrile today, WBC today down to 9.4, SCr down to 0.45.  Plan: - Continue vancomycin 1250 mg IV every 12 hours (eAUC 498, SCr 0.8) - Continue cefepime 2 g IV every 8 hours - Follow-up cultures - Monitor renal function and clinical progress - Follow-up de-escalation plans for oral antibiotics  Height: 5\' 11"  (180.3 cm) Weight: 74.5 kg (164 lb 3.9 oz) IBW/kg (Calculated) : 75.3  Temp (24hrs), Avg:98.5 F (36.9 C), Min:98 F (36.7 C), Max:98.9 F (37.2 C)  Recent Labs  Lab 11/13/20 1559 11/13/20 1738 11/14/20 0307 11/15/20 0416 11/16/20 0123 11/17/20 0131  WBC 12.4*  --  13.6* 9.1 13.3* 9.4  CREATININE 0.75  --  0.57* 0.51* 0.58* 0.45*  LATICACIDVEN  --  2.0*  --   --   --   --     Estimated Creatinine Clearance: 106.1 mL/min (A) (by C-G formula based on SCr of 0.45 mg/dL (L)).    Allergies  Allergen Reactions  . Bee Venom Anaphylaxis  . Lactose Intolerance (Gi) Other (See Comments)    GI upset    Antimicrobials this admission: 3/16 Zosyn x1 3/16 Vancomycin >>  3/17 Cefepime >>  3/17 Metronidazole >> 3/19  Microbiology results: 2/28 Deep surgical wound (foot): Klebsiella oxytoca- sensitive except ampicillin/unasyn 3/17 MRSA PCR: negative 3/18 L foot tissue: PSA, staph, pending 3/19 C.Diff: negative x2     Aryn Kops L. Devin Going, PharmD, [place] PGY2 Pharmacy Resident Weekends 7:00 am - 3:00 pm, please call [phone] 11/17/20     11:15 AM  Please check AMION for all [place] phone numbers After 10:00 PM, call the [place] [phone]

## 2020-11-17 NOTE — Progress Notes (Signed)
PROGRESS NOTE    [name] [name]  JZP:[id number] DOB: 1961-04-19 DOA: 11/13/2020 PCP: [name], NP    Brief Narrative:  [name] [name] is a 60 y.o. male with a history of diabetes mellitus, hypertension, chronic hepatitis C, GERD. Patient presented secondary to worsening left foot wound. Empiric Vancomycin and cefepime started on admission. MRI obtained which confirmed osteomyelitis and abscess. Orthopedic surgery consulted.  He was transitioned to [facility] and underwent transmetatarsal amputation on 3/18   Assessment & Plan:   Principal Problem:   Osteomyelitis ([place]) Active Problems:   Chronic hepatitis C without hepatic coma (HCC)   Hepatic cirrhosis (HCC)   Type 2 diabetes mellitus with hyperlipidemia ([place])   Essential hypertension   Cellulitis of left foot   Cellulitis of lower extremity   Cellulitis   Foot abscess, left   Diabetic foot infection/abscess Left leg cellulitis Osteomyelitis/septic arthritis Patient started empirically on Vancomycin and Cefepime on admission. MRI of left foot significant for evidence of osteomyelitis of multiple sites in addition to new abscess. MRI of tibia/fibula significant for evidence of likely cellulitis. Mild leukocytosis. -Orthopedic surgery consulted -Patient was transferred to [facility] underwent transmetatarsal amputation on 3/18 -Continue Vancomycin and Cefepime -intraoperative culture positive for pseudomonas and staph epi -discontinue flagyl -continue vanc/cefepime for now  Diabetes mellitus, type 2 Patient is on Invokana and glipizide as an outpatient. Hemoglobin A1C of 11.2% from 2/22. Started on Lantus inpatient -Blood sugars remain elevated -Increased lantus to 30 units daily and novolog to 9 units with meals -continue resistant SSI   Hyponatremia Mild. Sodium of 127 on admission  Likely contributed to by hyperglycemia and resultant pseudohyponatremia. Overall sodium has improved  Primary  hypertension -Continue losartan  History of liver cirrhosis History of alcohol abuse Not currently drinking alcohol. On chart review, ultrasound in 2016 suggested possible fibrosis. MRI of abdomen in 2018 suggested early cirrhosis with no mention of fibrosis. -Continue folate, multivitamin  Chronic anemia Chronic, mild and stable.  Hyperlipidemia -Continue Lipitor 40 mg daily  Depression -Continue Cymbalta, Remeron  GERD -Continue PPI (Protonix inpatient)  Dispo Patient reports that his living situation at a boarding house would not be conducive to appropriate wound healing with wound VAC Although his mother is involved in his care, she does not have adequate accommodations to care for him in the postop setting TOC consulted to see about possible placement since he has continued wound care needs with wound VAC Seen by PT/OT with recommendations for SNF   DVT prophylaxis: SCDs Start: 11/15/20 1032 heparin injection 5,000 Units Start: 11/13/20 2200  Code Status: DNR Family Communication: Updated patient's mother at the bedside Disposition Plan: Status is: Inpatient  Remains inpatient appropriate because:Inpatient level of care appropriate due to severity of illness   Dispo: The patient is from: [place]              Anticipated d/c is to: TBD              Patient currently is not medically stable to d/c.   Difficult to place patient No     Consultants:   Orthopedics, Dr. Sharol Given  Procedures:  LEFT TRANSMETATARSAL AMPUTATION Placement of Prevena wound VAC.  Antimicrobials:   Cefepime 3/17 >  Vancomycin 3/17 >  Flagyl 3/17 >319   Subjective: Still having some pain, no  Objective: Vitals:   11/16/20 1300 11/16/20 2131 11/17/20 0254 11/17/20 0314  BP: 117/73 129/81 124/69 129/82  Pulse: 80 65 63 62  Resp: 17  18 18 15   Temp: 98.5 F (36.9 C) 98 F (36.7 C) 98.5 F (36.9 C) 98.9 F (37.2 C)  TempSrc: Oral Oral Oral   SpO2: 100% 98% 97% 98%   Weight:      Height:        Intake/Output Summary (Last 24 hours) at 11/17/2020 1126 Last data filed at 11/17/2020 0700 Gross per 24 hour  Intake 3133.28 ml  Output 1825 ml  Net 1308.28 ml   Filed Weights   11/13/20 1546 11/14/20 0057 11/15/20 0842  Weight: 74.4 kg 74.5 kg 74.5 kg    Examination:  General exam: Alert, awake, oriented x 3 Respiratory system: Clear to auscultation. Respiratory effort normal. Cardiovascular system:RRR. No murmurs, rubs, gallops. Gastrointestinal system: Abdomen is nondistended, soft and nontender. No organomegaly or masses felt. Normal bowel sounds heard. Central nervous system: Alert and oriented. No focal neurological deficits. Extremities: left foot wound vac Skin: No rashes, lesions or ulcers Psychiatry: Judgement and insight appear normal. Mood & affect appropriate.     Data Reviewed: I have personally reviewed following labs and imaging studies  CBC: Recent Labs  Lab 11/13/20 1559 11/14/20 0307 11/15/20 0416 11/16/20 0123 11/17/20 0131  WBC 12.4* 13.6* 9.1 13.3* 9.4  NEUTROABS 8.4* 8.0*  --   --   --   HGB 11.8* 11.5* 13.5 12.4* 12.7*  HCT 36.3* 35.4* 40.0 37.4* 38.5*  MCV 91.7 91.7 88.1 89.7 89.3  PLT 370 336 381 357 621   Basic Metabolic Panel: Recent Labs  Lab 11/13/20 1559 11/14/20 0307 11/15/20 0416 11/16/20 0123 11/17/20 0131  NA 127* 132* 131* 131* 133*  K 4.0 3.9 4.2 3.8 4.1  CL 95* 103 102 103 101  CO2 22 20* 21* 25 27  GLUCOSE 469* 314* 307* 268* 229*  BUN 12 11 8 9 8   CREATININE 0.75 0.57* 0.51* 0.58* 0.45*  CALCIUM 8.4* 8.2* 8.8* 8.2* 8.5*   GFR: Estimated Creatinine Clearance: 106.1 mL/min (A) (by C-G formula based on SCr of 0.45 mg/dL (L)). Liver Function Tests: Recent Labs  Lab 11/13/20 1559 11/14/20 0307 11/15/20 0416  AST 27 21 26   ALT 21 16 22   ALKPHOS 77 66 71  BILITOT 0.6 0.5 0.8  PROT 7.5 6.6 7.3  ALBUMIN 3.4* 2.8* 2.8*   No results for input(s): LIPASE, AMYLASE in the last 168  hours. No results for input(s): AMMONIA in the last 168 hours. Coagulation Profile: Recent Labs  Lab 11/15/20 0416  INR 1.0   Cardiac Enzymes: No results for input(s): CKTOTAL, CKMB, CKMBINDEX, TROPONINI in the last 168 hours. BNP (last 3 results) No results for input(s): PROBNP in the last 8760 hours. HbA1C: No results for input(s): HGBA1C in the last 72 hours. CBG: Recent Labs  Lab 11/16/20 0654 11/16/20 1112 11/16/20 1654 11/16/20 2130 11/17/20 0640  GLUCAP 234* 322* 163* 165* 255*   Lipid Profile: No results for input(s): CHOL, HDL, LDLCALC, TRIG, CHOLHDL, LDLDIRECT in the last 72 hours. Thyroid Function Tests: No results for input(s): TSH, T4TOTAL, FREET4, T3FREE, THYROIDAB in the last 72 hours. Anemia Panel: No results for input(s): VITAMINB12, FOLATE, FERRITIN, TIBC, IRON, RETICCTPCT in the last 72 hours. Sepsis Labs: Recent Labs  Lab 11/13/20 1738  LATICACIDVEN 2.0*    Recent Results (from the past 240 hour(s))  SARS CORONAVIRUS 2 (TAT 6-24 HRS) Nasopharyngeal Nasopharyngeal Swab     Status: None   Collection Time: 11/13/20  9:37 PM   Specimen: Nasopharyngeal Swab  Result Value Ref Range Status   SARS  Coronavirus 2 NEGATIVE NEGATIVE Final    Comment: (NOTE) SARS-CoV-2 target nucleic acids are NOT DETECTED.  The SARS-CoV-2 RNA is generally detectable in upper and lower respiratory specimens during the acute phase of infection. Negative results do not preclude SARS-CoV-2 infection, do not rule out co-infections with other pathogens, and should not be used as the sole basis for treatment or other patient management decisions. Negative results must be combined with clinical observations, patient history, and epidemiological information. The expected result is Negative.  Fact Sheet for Patients: SugarRoll.be  Fact Sheet for Healthcare Providers: https://www.woods-mathews.com/  This test is not yet approved or  cleared by the [place] FDA and  has been authorized for detection and/or diagnosis of SARS-CoV-2 by FDA under an Emergency Use Authorization (EUA). This EUA will remain  in effect (meaning this test can be used) for the duration of the COVID-19 declaration under Se ction 564(b)(1) of the Act, 21 U.S.C. section 360bbb-3(b)(1), unless the authorization is terminated or revoked sooner.  Performed at [facility], [place] [address], [place], [place] [address]   MRSA PCR Screening     Status: None   Collection Time: 11/14/20 11:16 PM   Specimen: Nasal Mucosa; Nasopharyngeal  Result Value Ref Range Status   MRSA by PCR NEGATIVE NEGATIVE Final    Comment:        The GeneXpert MRSA Assay (FDA approved for NASAL specimens only), is one component of a comprehensive MRSA colonization surveillance program. It is not intended to diagnose MRSA infection nor to guide or monitor treatment for MRSA infections. Performed at [facility], [place] [address], [place], [place] [address]   Aerobic/Anaerobic Culture w Gram Stain (surgical/deep wound)     Status: None (Preliminary result)   Collection Time: 11/15/20  9:20 AM   Specimen: PATH Other; Tissue  Result Value Ref Range Status   Specimen Description TISSUE  Final   Special Requests LEFT FOOT SPEC A  Final   Gram Stain   Final    ABUNDANT WBC PRESENT, PREDOMINANTLY PMN FEW GRAM POSITIVE COCCI RARE GRAM NEGATIVE RODS    Culture   Final    ABUNDANT PSEUDOMONAS AERUGINOSA ABUNDANT STAPHYLOCOCCUS EPIDERMIDIS SUSCEPTIBILITIES TO FOLLOW Performed at [facility], [place] [address] [place], [place] [address]    Report Status PENDING  Incomplete  C Difficile Quick Screen w PCR reflex     Status: None   Collection Time: 11/16/20  1:17 AM   Specimen: STOOL  Result Value Ref Range Status   C Diff antigen NEGATIVE NEGATIVE Final   C Diff toxin NEGATIVE NEGATIVE Final   C Diff interpretation No C. difficile detected.  Final     Comment: Performed at [facility], [place] [address], [place],  [address]         Radiology Studies: No results found.      Scheduled Meds: . aspirin EC  81 mg Oral Daily  . atorvastatin  40 mg Oral Daily  . docusate sodium  100 mg Oral BID  . DULoxetine  60 mg Oral Daily  . folic acid  1 mg Oral Daily  . heparin  5,000 Units Subcutaneous Q8H  . insulin aspart  0-20 Units Subcutaneous TID WC  . insulin aspart  0-5 Units Subcutaneous QHS  . insulin aspart  9 Units Subcutaneous TID WC  . insulin glargine  30 Units Subcutaneous QHS  . losartan  50 mg Oral Daily  . mirtazapine  15 mg  Oral QHS  . multivitamin with minerals  1 tablet Oral Daily  . nicotine  7 mg Transdermal Daily  . saccharomyces boulardii  250 mg Oral BID   Continuous Infusions: . sodium chloride Stopped (11/16/20 0134)  . sodium chloride 10 mL/hr at 11/17/20 0645  . ceFEPime (MAXIPIME) IV 2 g (11/17/20 0916)  . lactated ringers 10 mL/hr at 11/15/20 0900  . vancomycin 1,250 mg (11/17/20 1123)     LOS: 4 days    Time spent: 18mins    Kathie Dike, MD [facility]   If 7PM-7AM, please contact night-coverage [email or web address]  11/17/2020, 11:26 AM

## 2020-11-18 ENCOUNTER — Inpatient Hospital Stay: Payer: Self-pay

## 2020-11-18 DIAGNOSIS — L97529 Non-pressure chronic ulcer of other part of left foot with unspecified severity: Secondary | ICD-10-CM

## 2020-11-18 DIAGNOSIS — I10 Essential (primary) hypertension: Secondary | ICD-10-CM

## 2020-11-18 DIAGNOSIS — B182 Chronic viral hepatitis C: Secondary | ICD-10-CM

## 2020-11-18 DIAGNOSIS — Z794 Long term (current) use of insulin: Secondary | ICD-10-CM

## 2020-11-18 DIAGNOSIS — E1165 Type 2 diabetes mellitus with hyperglycemia: Secondary | ICD-10-CM

## 2020-11-18 DIAGNOSIS — K7460 Unspecified cirrhosis of liver: Secondary | ICD-10-CM

## 2020-11-18 DIAGNOSIS — E11621 Type 2 diabetes mellitus with foot ulcer: Secondary | ICD-10-CM

## 2020-11-18 DIAGNOSIS — Z89422 Acquired absence of other left toe(s): Secondary | ICD-10-CM

## 2020-11-18 LAB — AEROBIC/ANAEROBIC CULTURE W GRAM STAIN (SURGICAL/DEEP WOUND)

## 2020-11-18 LAB — GLUCOSE, CAPILLARY
Glucose-Capillary: 263 mg/dL — ABNORMAL HIGH (ref 70–99)
Glucose-Capillary: 182 mg/dL — ABNORMAL HIGH (ref 70–99)
Glucose-Capillary: 148 mg/dL — ABNORMAL HIGH (ref 70–99)
Glucose-Capillary: 254 mg/dL — ABNORMAL HIGH (ref 70–99)

## 2020-11-18 MED ORDER — PANTOPRAZOLE SODIUM 40 MG PO TBEC
40.0000 mg | DELAYED_RELEASE_TABLET | Freq: Every day | ORAL | Status: DC
  Administered 2020-11-18 – 2020-11-26 (×9): 40 mg via ORAL
  Filled 2020-11-18 (×10): qty 1

## 2020-11-18 NOTE — Progress Notes (Signed)
Physical Therapy Treatment Patient Details Name: [name] [name] MRN: [id number] DOB: February 16, 1961 Today's Date: 11/18/2020    History of Present Illness Patient is a 60 y/o male presented to ED from wound care center for concerns of cellulitis of L great toe with ulceration and uncontrolled diabetes. Patient now s/p L transmetatarsal amputation on 3/18. PMH: depression, uncontrolled DM, GERD, hep C, neuropathy    PT Comments    Pt with improved ambulation tolerance/endurance. Pt continues to be able to maintain L LE NWB. Pt given ankle pumps and leg raises for exercises. Pt asked if he could try crutches however we talked about the wound vac line and IV line and how is would be really easy to get tangled up in them, pt agreed.    Follow Up Recommendations  SNF;Supervision for mobility/OOB     Equipment Recommendations  Rolling walker with 5" wheels;3in1 (PT);Wheelchair (measurements PT);Wheelchair cushion (measurements PT)    Recommendations for Other Services       Precautions / Restrictions Precautions Precautions: Fall Required Braces or Orthoses: Other Brace Other Brace: has DARCO shoe in room previously owned Restrictions Weight Bearing Restrictions: Yes LLE Weight Bearing: Non weight bearing    Mobility  Bed Mobility Overal bed mobility: Modified Independent                  Transfers Overall transfer level: Needs assistance Equipment used: Rolling walker (2 wheeled) Transfers: Sit to/from Stand Sit to Stand: Supervision         General transfer comment: supervision for safety and wound vac management  Ambulation/Gait Ambulation/Gait assistance: Min guard Gait Distance (Feet): 60 Feet (x2) Assistive device: Rolling walker (2 wheeled) Gait Pattern/deviations: Step-to pattern (hop to) Gait velocity: slow Gait velocity interpretation: <1.31 ft/sec, indicative of household ambulator General Gait Details: hop to pattern, pt able to maintain L LE NWB,  seated rest break s/p 60 feet   Stairs             Wheelchair Mobility    Modified Rankin (Stroke Patients Only)       Balance Overall balance assessment: Mild deficits observed, not formally tested                                          Cognition Arousal/Alertness: Awake/alert Behavior During Therapy: WFL for tasks assessed/performed Overall Cognitive Status: Within Functional Limits for tasks assessed                                 General Comments: pt instructed to not get up without staff      Exercises      General Comments General comments (skin integrity, edema, etc.): wound vac present and working      Pertinent Vitals/Pain Pain Assessment: 0-10 Pain Score: 5  Pain Location: L foot Pain Descriptors / Indicators: Guarding    Home Living                      Prior Function            PT Goals (current goals can now be found in the care plan section) Acute Rehab PT Goals Patient Stated Goal: to protect my foot PT Goal Formulation: With patient Time For Goal Achievement: 11/30/20 Potential to Achieve Goals: Good Progress towards PT goals: Progressing toward  goals    Frequency    Min 5X/week      PT Plan Current plan remains appropriate    Co-evaluation              AM-PAC PT "6 Clicks" Mobility   Outcome Measure  Help needed turning from your back to your side while in a flat bed without using bedrails?: None Help needed moving from lying on your back to sitting on the side of a flat bed without using bedrails?: None Help needed moving to and from a bed to a chair (including a wheelchair)?: A Little Help needed standing up from a chair using your arms (e.g., wheelchair or bedside chair)?: A Little Help needed to walk in hospital room?: A Little Help needed climbing 3-5 steps with a railing? : A Little 6 Click Score: 20    End of Session Equipment Utilized During Treatment: Gait  belt Activity Tolerance: Patient tolerated treatment well Patient left: in chair;with call bell/phone within reach Nurse Communication: Mobility status PT Visit Diagnosis: Unsteadiness on feet (R26.81);Muscle weakness (generalized) (M62.81);Other abnormalities of gait and mobility (R26.89)     Time: 1250-1319 PT Time Calculation (min) (ACUTE ONLY): 29 min  Charges:  $Gait Training: 23-37 mins                     [name] [name], PT, DPT Acute Rehabilitation Services Pager #: [phone] Office #: [phone]    Berline Lopes 11/18/2020, 2:34 PM

## 2020-11-18 NOTE — Plan of Care (Signed)
Problem: Health Behavior/Discharge Planning: Goal: Ability to manage health-related needs will improve 11/18/2020 1109 by Madaline Brilliant, RN Outcome: Progressing 11/18/2020 1109 by Madaline Brilliant, RN Outcome: Progressing   Problem: Clinical Measurements: Goal: Ability to maintain clinical measurements within normal limits will improve 11/18/2020 1109 by Madaline Brilliant, RN Outcome: Progressing 11/18/2020 1109 by Madaline Brilliant, RN Outcome: Progressing Goal: Will remain free from infection 11/18/2020 1109 by Madaline Brilliant, RN Outcome: Progressing 11/18/2020 1109 by Madaline Brilliant, RN Outcome: Progressing Goal: Diagnostic test results will improve 11/18/2020 1109 by Madaline Brilliant, RN Outcome: Progressing 11/18/2020 1109 by Madaline Brilliant, RN Outcome: Progressing Goal: Respiratory complications will improve 11/18/2020 1109 by Madaline Brilliant, RN Outcome: Progressing 11/18/2020 1109 by Madaline Brilliant, RN Outcome: Progressing Goal: Cardiovascular complication will be avoided 11/18/2020 1109 by Madaline Brilliant, RN Outcome: Progressing 11/18/2020 1109 by Madaline Brilliant, RN Outcome: Progressing   Problem: Activity: Goal: Risk for activity intolerance will decrease 11/18/2020 1109 by Madaline Brilliant, RN Outcome: Progressing 11/18/2020 1109 by Madaline Brilliant, RN Outcome: Progressing   Problem: Nutrition: Goal: Adequate nutrition will be maintained 11/18/2020 1109 by Madaline Brilliant, RN Outcome: Progressing 11/18/2020 1109 by Madaline Brilliant, RN Outcome: Progressing   Problem: Coping: Goal: Level of anxiety will decrease 11/18/2020 1109 by Madaline Brilliant, RN Outcome: Progressing 11/18/2020 1109 by Madaline Brilliant, RN Outcome: Progressing   Problem: Elimination: Goal: Will not experience complications related to bowel motility 11/18/2020 1109 by Madaline Brilliant, RN Outcome: Progressing 11/18/2020 1109 by Madaline Brilliant, RN Outcome: Progressing Goal:  Will not experience complications related to urinary retention 11/18/2020 1109 by Madaline Brilliant, RN Outcome: Progressing 11/18/2020 1109 by Madaline Brilliant, RN Outcome: Progressing   Problem: Pain Managment: Goal: General experience of comfort will improve 11/18/2020 1109 by Madaline Brilliant, RN Outcome: Progressing 11/18/2020 1109 by Madaline Brilliant, RN Outcome: Progressing   Problem: Safety: Goal: Ability to remain free from injury will improve 11/18/2020 1109 by Madaline Brilliant, RN Outcome: Progressing 11/18/2020 1109 by Madaline Brilliant, RN Outcome: Progressing   Problem: Skin Integrity: Goal: Risk for impaired skin integrity will decrease 11/18/2020 1109 by Madaline Brilliant, RN Outcome: Progressing 11/18/2020 1109 by Madaline Brilliant, RN Outcome: Progressing   Problem: Education: Goal: Ability to describe self-care measures that may prevent or decrease complications (Diabetes Survival Skills Education) will improve 11/18/2020 1109 by Madaline Brilliant, RN Outcome: Progressing 11/18/2020 1109 by Madaline Brilliant, RN Outcome: Progressing Goal: Individualized Educational Video(s) 11/18/2020 1109 by Madaline Brilliant, RN Outcome: Progressing 11/18/2020 1109 by Madaline Brilliant, RN Outcome: Progressing   Problem: Coping: Goal: Ability to adjust to condition or change in health will improve 11/18/2020 1109 by Madaline Brilliant, RN Outcome: Progressing 11/18/2020 1109 by Madaline Brilliant, RN Outcome: Progressing   Problem: Fluid Volume: Goal: Ability to maintain a balanced intake and output will improve 11/18/2020 1109 by Madaline Brilliant, RN Outcome: Progressing 11/18/2020 1109 by Madaline Brilliant, RN Outcome: Progressing   Problem: Health Behavior/Discharge Planning: Goal: Ability to identify and utilize available resources and services will improve 11/18/2020 1109 by Madaline Brilliant, RN Outcome: Progressing 11/18/2020 1109 by Madaline Brilliant, RN Outcome:  Progressing Goal: Ability to manage health-related needs will improve 11/18/2020 1109 by Madaline Brilliant, RN Outcome: Progressing 11/18/2020 1109 by Madaline Brilliant, RN Outcome: Progressing   Problem: Metabolic: Goal: Ability to maintain appropriate glucose levels will  improve 11/18/2020 1109 by Madaline Brilliant, RN Outcome: Progressing 11/18/2020 1109 by Madaline Brilliant, RN Outcome: Progressing   Problem: Nutritional: Goal: Maintenance of adequate nutrition will improve 11/18/2020 1109 by Madaline Brilliant, RN Outcome: Progressing 11/18/2020 1109 by Madaline Brilliant, RN Outcome: Progressing Goal: Progress toward achieving an optimal weight will improve 11/18/2020 1109 by Madaline Brilliant, RN Outcome: Progressing 11/18/2020 1109 by Madaline Brilliant, RN Outcome: Progressing   Problem: Skin Integrity: Goal: Risk for impaired skin integrity will decrease 11/18/2020 1109 by Madaline Brilliant, RN Outcome: Progressing 11/18/2020 1109 by Madaline Brilliant, RN Outcome: Progressing   Problem: Tissue Perfusion: Goal: Adequacy of tissue perfusion will improve 11/18/2020 1109 by Madaline Brilliant, RN Outcome: Progressing 11/18/2020 1109 by Madaline Brilliant, RN Outcome: Progressing

## 2020-11-18 NOTE — Plan of Care (Signed)

## 2020-11-18 NOTE — Consult Note (Signed)
Date of Admission:  11/13/2020          Reason for Consult: Diabetic foot ulcer with osteomyelitis status post transmetatarsal amputation     Referring Provider: Dr. Roderic Palau   Assessment:  1. Diabetic foot infection with osteomyelitis status post transmetatarsal amputation with methicillin-resistant Staph epidermidis and Pseudomonas aeruginosa isolated from operative cultures 2. Recent hospitalization with diabetic foot infection and group A streptococcal bacteremia [name] [name] had also been isolated from wound culture during that hospitalization) 3. 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 Poorly controlled diabetes mellitus requiring insulin 5. Smoking  Plan:  1. Agree with vancomycin and cefepime and will treat for 6 weeks (alternate regimen if we run into nephrotoxicity would be cefepime and doxycycline 2. Would treat for 4- 6 weeks 3. Will obtain MRI of the opposite foot   Diagnosis: Osteomyelitis  Culture Result: Staphylococcus epidermidis and Pseudomonas aeruginosa  Allergies  Allergen Reactions   Bee Venom Anaphylaxis   Lactose Intolerance (Gi) Other (See Comments)    GI upset    OPAT Orders Discharge antibiotics:  Cefepime  Vancomycin per pharmacy protocol  Aim for Vancomycin trough 15-20 (unless otherwise indicated)   Duration:  6 weeks  End Date:  April 29th, 2022  [facility] Care Per Protocol:  Labs BI- weekly while on IV antibiotics:  _x_ BMP w GFR x__ Vancomycin trough  Labs  weekly while on IV antibiotics: _x_ CBC with differential  _x_ CRP _x_ ESR   _ Please pull PIC at completion of IV antibiotics _x_ Please leave PIC in place until doctor has seen patient or been notified  Fax weekly labs to [phone]  Clinic Follow Up Appt:   [name] [name] has an appointment on 11/28/2020 at 10Am with Dr. Tommy Medal  He then has another appointment on April 29th, 2022 at 915 AM with Dr Tommy Medal  The  [facility] is located in the [facility]  [place] in [place].  [address], which is located to the left of the elevators.  Phone: [phone]  Fax: [phone]  [email or web address]  He should arrive 15 minutes prior to his appointments.  Principal Problem:   Osteomyelitis ([place]) Active Problems:   Chronic hepatitis C without hepatic coma (HCC)   Hepatic cirrhosis (HCC)   Type 2 diabetes mellitus with hyperlipidemia (HCC)   Essential hypertension   Cellulitis of left foot   Cellulitis of lower extremity   Cellulitis   Foot abscess, left   Scheduled Meds:  aspirin EC  81 mg Oral Daily   atorvastatin  40 mg Oral Daily   DULoxetine  60 mg Oral Daily   folic acid  1 mg Oral Daily   heparin  5,000 Units Subcutaneous Q8H   insulin aspart  0-20 Units Subcutaneous TID WC   insulin aspart  0-5 Units Subcutaneous QHS   insulin aspart  9 Units Subcutaneous TID WC   insulin glargine  30 Units Subcutaneous QHS   losartan  50 mg Oral Daily   mirtazapine  15 mg Oral QHS   multivitamin with minerals  1 tablet Oral Daily   nicotine  7 mg Transdermal Daily   pantoprazole  40 mg Oral Daily   saccharomyces boulardii  250 mg Oral BID   Continuous Infusions:  sodium chloride Stopped (11/16/20 0134)   sodium chloride 10 mL/hr at 11/17/20 2200   ceFEPime (MAXIPIME) IV 2 g (11/18/20  1000)   lactated ringers 10 mL/hr at 11/15/20 0900   vancomycin 1,250 mg (11/18/20 1300)   PRN Meds:.acetaminophen **OR** acetaminophen, albuterol, HYDROmorphone (DILAUDID) injection, loperamide, metoCLOPramide **OR** metoCLOPramide (REGLAN) injection, ondansetron **OR** ondansetron (ZOFRAN) IV, oxyCODONE  HPI: [name] [name] is a 60 y.o. male who was admitted to the hospitalist service in late February with sepsis in the context of a diabetic foot infection and group A streptococcus bacteremia.  He had a wound  culture obtained during that admission that also grew group A streptococcus as well as Klebsiella oxytoca.  He was treated initially broad-spectrum antibiotics.  MRI at the time of failed to show evidence of osteomyelitis.  He was eventually narrowed to Augmentin and discharged on oral Augmentin on the fourth 2022.  He was then readmitted on 16 March and seen by podiatry who noted that he had erythema streaking up his leg.  Blood culture did not appear to have been obtained during this admission he was initially started on broad-spectrum antibiotics in the form of vancomycin and Zosyn then and vancomycin cefepime and metronidazole.  He was then taken to the operating room March 18 and underwent transmetatarsal amputation.  Operative cultures have yielded coagulase-negative Staphylococcus epidermidis and Pseudomonas aeruginosa.  He has been narrowed to vancomycin and cefepime.  I will plan on giving him 4 to 6 weeks of parenteral antibiotics, esp since I do not want to use a quinolone which would be needed if we pursued oral therapy at any point in time.  I have set up appointments for him as listed above.     Review of Systems: Review of Systems  Constitutional: Negative for chills, diaphoresis, fever, malaise/fatigue and weight loss.  HENT: Negative for congestion, hearing loss, sore throat and tinnitus.   Eyes: Negative for blurred vision and double vision.  Respiratory: Negative for cough, sputum production, shortness of breath and wheezing.   Cardiovascular: Negative for chest pain, palpitations and leg swelling.  Gastrointestinal: Negative for abdominal pain, blood in stool, constipation, diarrhea, heartburn, melena, nausea and vomiting.  Genitourinary: Negative for dysuria, flank pain and hematuria.  Musculoskeletal: Positive for joint pain and myalgias. Negative for back pain and falls.  Skin: Negative for itching and rash.  Neurological: Positive for sensory change. Negative for  dizziness, focal weakness, loss of consciousness, weakness and headaches.  Endo/Heme/Allergies: Does not bruise/bleed easily.  Psychiatric/Behavioral: Positive for depression. Negative for memory loss and suicidal ideas. The patient is not nervous/anxious.     Past Medical History:  Diagnosis Date   Barrett's esophagus dx 2016   Bronchitis    Depression    Diabetes ([place])    ED (erectile dysfunction)    GERD (gastroesophageal reflux disease)    Hepatitis C    Hiatal hernia 10/2014   3cm   Neuropathy     Social History   Tobacco Use   Smoking status: Current Every Day Smoker    Packs/day: 0.50    Years: 35.00    Pack years: 17.50    Types: Cigarettes   Smokeless tobacco: Never Used   Tobacco comment: none x 3 weeks as of 05/22/2020  Vaping Use   Vaping Use: Never used  Substance Use Topics   Alcohol use: Not Currently    Comment: daily   Drug use: No    Family History  Problem Relation Age of Onset   Cancer Mother        breast   Diabetes Mother    Heart disease Maternal Grandfather  Pancreatic cancer Paternal Grandmother    Stroke Father    Alcohol abuse Father    Pancreatic cancer Father    Colon cancer Neg Hx    Stomach cancer Neg Hx    Rectal cancer Neg Hx    Esophageal cancer Neg Hx    Allergies  Allergen Reactions   Bee Venom Anaphylaxis   Lactose Intolerance (Gi) Other (See Comments)    GI upset    OBJECTIVE: Blood pressure 123/84, pulse 70, temperature 98.4 F (36.9 C), temperature source Oral, resp. rate 18, height '5\' 11"'  (1.803 m), weight 74.5 kg, SpO2 98 %.  Physical Exam Constitutional:      Appearance: He is well-developed.  HENT:     Head: Normocephalic and atraumatic.  Eyes:     Conjunctiva/sclera: Conjunctivae normal.  Cardiovascular:     Rate and Rhythm: Normal rate and regular rhythm.  Pulmonary:     Effort: Pulmonary effort is normal. No respiratory distress.     Breath sounds: No wheezing.   Abdominal:     General: There is no distension.     Palpations: Abdomen is soft.  Musculoskeletal:        General: No tenderness. Normal range of motion.     Cervical back: Normal range of motion and neck supple.  Skin:    General: Skin is warm and dry.     Coloration: Skin is pale.     Findings: No erythema or rash.  Neurological:     General: No focal deficit present.     Mental Status: He is alert and oriented to person, place, and time.  Psychiatric:        Attention and Perception: Attention normal.        Mood and Affect: Mood normal.        Behavior: Behavior is cooperative.        Thought Content: Thought content normal.        Cognition and Memory: Cognition is impaired.     Comments: Speech difficult to understand     Lab Results Lab Results  Component Value Date   WBC 9.4 11/17/2020   HGB 12.7 (L) 11/17/2020   HCT 38.5 (L) 11/17/2020   MCV 89.3 11/17/2020   PLT 360 11/17/2020    Lab Results  Component Value Date   CREATININE 0.45 (L) 11/17/2020   BUN 8 11/17/2020   NA 133 (L) 11/17/2020   K 4.1 11/17/2020   CL 101 11/17/2020   CO2 27 11/17/2020    Lab Results  Component Value Date   ALT 22 11/15/2020   AST 26 11/15/2020   ALKPHOS 71 11/15/2020   BILITOT 0.8 11/15/2020     Microbiology: Recent Results (from the past 240 hour(s))  SARS CORONAVIRUS 2 (TAT 6-24 HRS) Nasopharyngeal Nasopharyngeal Swab     Status: None   Collection Time: 11/13/20  9:37 PM   Specimen: Nasopharyngeal Swab  Result Value Ref Range Status   SARS Coronavirus 2 NEGATIVE NEGATIVE Final    Comment: (NOTE) SARS-CoV-2 target nucleic acids are NOT DETECTED.  The SARS-CoV-2 RNA is generally detectable in upper and lower respiratory specimens during the acute phase of infection. Negative results do not preclude SARS-CoV-2 infection, do not rule out co-infections with other pathogens, and should not be used as the sole basis for treatment or other patient management  decisions. Negative results must be combined with clinical observations, patient history, and epidemiological information. The expected result is Negative.  Fact Sheet for Patients: SugarRoll.be  Fact Sheet for Healthcare Providers: https://www.woods-mathews.com/  This test is not yet approved or cleared by the [place] FDA and  has been authorized for detection and/or diagnosis of SARS-CoV-2 by FDA under an Emergency Use Authorization (EUA). This EUA will remain  in effect (meaning this test can be used) for the duration of the COVID-19 declaration under Se ction 564(b)(1) of the Act, 21 U.S.C. section 360bbb-3(b)(1), unless the authorization is terminated or revoked sooner.  Performed at [facility], [place] [address], [place], [place] [address]   MRSA PCR Screening     Status: None   Collection Time: 11/14/20 11:16 PM   Specimen: Nasal Mucosa; Nasopharyngeal  Result Value Ref Range Status   MRSA by PCR NEGATIVE NEGATIVE Final    Comment:        The GeneXpert MRSA Assay (FDA approved for NASAL specimens only), is one component of a comprehensive MRSA colonization surveillance program. It is not intended to diagnose MRSA infection nor to guide or monitor treatment for MRSA infections. Performed at [facility], [place] [address] [place], [place] [address]   Aerobic/Anaerobic Culture w Gram Stain (surgical/deep wound)     Status: None   Collection Time: 11/15/20  9:20 AM   Specimen: PATH Other; Tissue  Result Value Ref Range Status   Specimen Description TISSUE  Final   Special Requests LEFT FOOT SPEC A  Final   Gram Stain   Final    ABUNDANT WBC PRESENT, PREDOMINANTLY PMN FEW GRAM POSITIVE COCCI RARE GRAM NEGATIVE RODS    Culture   Final    ABUNDANT PSEUDOMONAS AERUGINOSA ABUNDANT STAPHYLOCOCCUS EPIDERMIDIS NO ANAEROBES ISOLATED Performed at [facility], [place] [address] [place], [place] [address]     Report Status 11/18/2020 FINAL  Final   Organism ID, Bacteria PSEUDOMONAS AERUGINOSA  Final   Organism ID, Bacteria STAPHYLOCOCCUS EPIDERMIDIS  Final      Susceptibility   Pseudomonas aeruginosa - MIC*    CEFTAZIDIME 4 SENSITIVE Sensitive     CIPROFLOXACIN 0.5 SENSITIVE Sensitive     GENTAMICIN <=1 SENSITIVE Sensitive     IMIPENEM 2 SENSITIVE Sensitive     * ABUNDANT PSEUDOMONAS AERUGINOSA   Staphylococcus epidermidis - MIC*    CIPROFLOXACIN <=0.5 SENSITIVE Sensitive     ERYTHROMYCIN >=8 RESISTANT Resistant     GENTAMICIN <=0.5 SENSITIVE Sensitive     OXACILLIN >=4 RESISTANT Resistant     TETRACYCLINE 2 SENSITIVE Sensitive     VANCOMYCIN 2 SENSITIVE Sensitive     TRIMETH/SULFA 160 RESISTANT Resistant     CLINDAMYCIN >=8 RESISTANT Resistant     RIFAMPIN <=0.5 SENSITIVE Sensitive     Inducible Clindamycin NEGATIVE Sensitive     * ABUNDANT STAPHYLOCOCCUS EPIDERMIDIS  C Difficile Quick Screen w PCR reflex     Status: None   Collection Time: 11/16/20  1:17 AM   Specimen: STOOL  Result Value Ref Range Status   C Diff antigen NEGATIVE NEGATIVE Final   C Diff toxin NEGATIVE NEGATIVE Final   C Diff interpretation No C. difficile detected.  Final    Comment: Performed at [facility], [place] [address], [place], [place] [address]    Alcide Evener, [facility] [phone] pager  11/18/2020, 3:08 PM

## 2020-11-18 NOTE — Progress Notes (Signed)
PROGRESS NOTE    [name] [name]  ZOX:[id number] DOB: 1960/11/16 DOA: 11/13/2020 PCP: [name], NP    Brief Narrative:  [name] [name] is a 60 y.o. male with a history of diabetes mellitus, hypertension, chronic hepatitis C, GERD. Patient presented secondary to worsening left foot wound. Empiric Vancomycin and cefepime started on admission. MRI obtained which confirmed osteomyelitis and abscess. Orthopedic surgery consulted.  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 MRI of left foot significant for evidence of osteomyelitis of multiple sites in addition to new abscess. MRI of tibia/fibula significant for evidence of likely cellulitis. Mild leukocytosis. -Orthopedic surgery consulted -Patient was transferred to [facility] underwent transmetatarsal amputation on 3/18 -Continue Vancomycin and Cefepime -intraoperative culture positive for pseudomonas and staph epi -discontinue flagyl -continue vanc/cefepime for now -will request ID input regarding antibiotic choice and length of treatment  Diabetes mellitus, type 2 Patient is on Invokana and glipizide as an outpatient. Hemoglobin A1C of 11.2% from 2/22. Started on Lantus inpatient -Blood sugars remain elevated -Increased lantus to 30 units daily and novolog to 9 units with meals -continue resistant SSI   Hyponatremia Mild. Sodium of 127 on admission  Likely contributed to by hyperglycemia and resultant  pseudohyponatremia. Overall sodium has improved  Primary hypertension -Continue losartan  History of liver cirrhosis History of alcohol abuse Not currently drinking alcohol. On chart review, ultrasound in 2016 suggested possible fibrosis. MRI of abdomen in 2018 suggested early cirrhosis with no mention of fibrosis. -Continue folate, multivitamin  Chronic anemia Chronic, mild and stable.  Hyperlipidemia -Continue Lipitor 40 mg daily  Depression -Continue Cymbalta, Remeron  GERD -Continue PPI   Dispo Patient reports that his living situation at a boarding house would not be conducive to appropriate wound healing with wound VAC Although his mother is involved in his care, she does not have adequate accommodations to care for him in the postop setting TOC consulted to see about possible placement since he has continued wound care needs with wound VAC Seen by PT/OT with recommendations for SNF   DVT prophylaxis: SCDs Start: 11/15/20 1032 heparin injection 5,000 Units Start: 11/13/20 2200  Code Status: DNR Family Communication: Updated patient's mother at the bedside Disposition Plan: Status is: Inpatient  Remains inpatient appropriate because:Inpatient level of care appropriate due to severity of illness   Dispo: The patient is from: [place]              Anticipated d/c is to: TBD              Patient currently is not medically stable to d/c.   Difficult to place patient No     Consultants:   Orthopedics, Dr. Sharol Given  Procedures:  LEFT TRANSMETATARSAL AMPUTATION Placement of Prevena wound VAC.  Antimicrobials:   Cefepime 3/17 >  Vancomycin 3/17 >  Flagyl 3/17 >319   Subjective: Has had 2 loose stools today. Describes some indigestion  Objective: Vitals:   11/17/20 2206 11/18/20 0410 11/18/20 0904 11/18/20 1228  BP: 139/85 122/70 109/63 123/84  Pulse: 63 65 67 70  Resp: 18 18 18 18   Temp: 98.4 F (36.9 C) 98.4 F (36.9 C)    TempSrc:  Oral Oral    SpO2: 99% 99% 97% 98%  Weight:      Height:        Intake/Output Summary (Last 24 hours) at 11/18/2020 1229 Last data filed at 11/18/2020 1033 Gross per 24 hour  Intake 2869.2 ml  Output 1175 ml  Net 1694.2 ml   Filed Weights   11/13/20 1546 11/14/20 0057 11/15/20 0842  Weight: 74.4 kg 74.5 kg 74.5 kg    Examination:  General exam: Alert, awake, oriented x 3 Respiratory system: Clear to auscultation. Respiratory effort normal. Cardiovascular system:RRR. No murmurs, rubs, gallops. Gastrointestinal system: Abdomen is nondistended, soft and nontender. No organomegaly or masses felt. Normal bowel sounds heard. Central nervous system: Alert and oriented. No focal neurological deficits. Extremities: left foot wound vac Skin: No rashes, lesions or ulcers Psychiatry: Judgement and insight appear normal. Mood & affect appropriate.     Data Reviewed: I have personally reviewed following labs and imaging studies  CBC: Recent Labs  Lab 11/13/20 1559 11/14/20 0307 11/15/20 0416 11/16/20 0123 11/17/20 0131  WBC 12.4* 13.6* 9.1 13.3* 9.4  NEUTROABS 8.4* 8.0*  --   --   --   HGB 11.8* 11.5* 13.5 12.4* 12.7*  HCT 36.3* 35.4* 40.0 37.4* 38.5*  MCV 91.7 91.7 88.1 89.7 89.3  PLT 370 336 381 357 785   Basic Metabolic Panel: Recent Labs  Lab 11/13/20 1559 11/14/20 0307 11/15/20 0416 11/16/20 0123 11/17/20 0131  NA 127* 132* 131* 131* 133*  K 4.0 3.9 4.2 3.8 4.1  CL 95* 103 102 103 101  CO2 22 20* 21* 25 27  GLUCOSE 469* 314* 307* 268* 229*  BUN 12 11 8 9 8   CREATININE 0.75 0.57* 0.51* 0.58* 0.45*  CALCIUM 8.4* 8.2* 8.8* 8.2* 8.5*   GFR: Estimated Creatinine Clearance: 106.1 mL/min (A) (by C-G formula based on SCr of 0.45 mg/dL (L)). Liver Function Tests: Recent Labs  Lab 11/13/20 1559 11/14/20 0307 11/15/20 0416  AST 27 21 26   ALT 21 16 22   ALKPHOS 77 66 71  BILITOT 0.6 0.5 0.8  PROT 7.5 6.6 7.3  ALBUMIN 3.4* 2.8* 2.8*   No results for input(s):  LIPASE, AMYLASE in the last 168 hours. No results for input(s): AMMONIA in the last 168 hours. Coagulation Profile: Recent Labs  Lab 11/15/20 0416  INR 1.0   Cardiac Enzymes: No results for input(s): CKTOTAL, CKMB, CKMBINDEX, TROPONINI in the last 168 hours. BNP (last 3 results) No results for input(s): PROBNP in the last 8760 hours. HbA1C: No results for input(s): HGBA1C in the last 72 hours. CBG: Recent Labs  Lab 11/17/20 0640 11/17/20 1137 11/17/20 1606 11/17/20 2110 11/18/20 0634  GLUCAP 255* 237* 134* 284* 263*   Lipid Profile: No results for input(s): CHOL, HDL, LDLCALC, TRIG, CHOLHDL, LDLDIRECT in the last 72 hours. Thyroid Function Tests: No results for input(s): TSH, T4TOTAL, FREET4, T3FREE, THYROIDAB in the last 72 hours. Anemia Panel: No results for input(s): VITAMINB12, FOLATE, FERRITIN, TIBC, IRON, RETICCTPCT in the last 72 hours. Sepsis Labs: Recent Labs  Lab 11/13/20 1738  LATICACIDVEN 2.0*    Recent Results (from the past 240 hour(s))  SARS CORONAVIRUS 2 (TAT 6-24 HRS) Nasopharyngeal Nasopharyngeal Swab     Status: None   Collection Time: 11/13/20  9:37 PM   Specimen: Nasopharyngeal Swab  Result Value Ref Range Status   SARS Coronavirus 2 NEGATIVE NEGATIVE Final    Comment: (NOTE) SARS-CoV-2 target nucleic acids are NOT DETECTED.  The SARS-CoV-2 RNA is generally detectable in upper and lower respiratory specimens during the acute phase of infection. Negative results do not preclude SARS-CoV-2 infection, do not rule out co-infections with other pathogens, and should not be used as the sole basis for treatment or other patient management decisions. Negative results must be combined with clinical observations, patient history, and epidemiological information. The expected result is Negative.  Fact Sheet for Patients: SugarRoll.be  Fact Sheet for Healthcare Providers: https://www.woods-mathews.com/  This  test is not yet approved or cleared by the [place] FDA and  has been authorized for detection and/or diagnosis of SARS-CoV-2 by FDA under an Emergency Use Authorization (EUA). This EUA will remain  in effect (meaning this test can be used) for the duration of the COVID-19 declaration under Se ction 564(b)(1) of the Act, 21 U.S.C. section 360bbb-3(b)(1), unless the authorization is terminated or revoked sooner.  Performed at [facility], [place] [address], [place], [place] [address]   MRSA PCR Screening     Status: None   Collection Time: 11/14/20 11:16 PM   Specimen: Nasal Mucosa; Nasopharyngeal  Result Value Ref Range Status   MRSA by PCR NEGATIVE NEGATIVE Final    Comment:        The GeneXpert MRSA Assay (FDA approved for NASAL specimens only), is one component of a comprehensive MRSA colonization surveillance program. It is not intended to diagnose MRSA infection nor to guide or monitor treatment for MRSA infections. Performed at [facility], [place] [address] [place], [place] [address]   Aerobic/Anaerobic Culture w Gram Stain (surgical/deep wound)     Status: None (Preliminary result)   Collection Time: 11/15/20  9:20 AM   Specimen: PATH Other; Tissue  Result Value Ref Range Status   Specimen Description TISSUE  Final   Special Requests LEFT FOOT SPEC A  Final   Gram Stain   Final    ABUNDANT WBC PRESENT, PREDOMINANTLY PMN FEW GRAM POSITIVE COCCI RARE GRAM NEGATIVE RODS Performed at [facility], [place] [address], [place], [place] [address]    Culture   Final    ABUNDANT PSEUDOMONAS AERUGINOSA ABUNDANT STAPHYLOCOCCUS EPIDERMIDIS NO ANAEROBES ISOLATED; CULTURE IN PROGRESS FOR 5 DAYS    Report Status PENDING  Incomplete   Organism ID, Bacteria PSEUDOMONAS AERUGINOSA  Final   Organism ID, Bacteria STAPHYLOCOCCUS EPIDERMIDIS  Final      Susceptibility   Pseudomonas aeruginosa - MIC*    CEFTAZIDIME 4 SENSITIVE Sensitive     CIPROFLOXACIN 0.5  SENSITIVE Sensitive     GENTAMICIN <=1 SENSITIVE Sensitive     IMIPENEM 2 SENSITIVE Sensitive     * ABUNDANT PSEUDOMONAS AERUGINOSA   Staphylococcus epidermidis - MIC*    CIPROFLOXACIN <=0.5 SENSITIVE Sensitive     ERYTHROMYCIN >=8 RESISTANT Resistant     GENTAMICIN <=0.5 SENSITIVE Sensitive     OXACILLIN >=4 RESISTANT Resistant     TETRACYCLINE 2 SENSITIVE Sensitive     VANCOMYCIN 2 SENSITIVE Sensitive     TRIMETH/SULFA 160 RESISTANT Resistant     CLINDAMYCIN >=8 RESISTANT Resistant     RIFAMPIN <=0.5 SENSITIVE Sensitive     Inducible Clindamycin NEGATIVE Sensitive     * ABUNDANT STAPHYLOCOCCUS EPIDERMIDIS  C Difficile Quick Screen w PCR reflex     Status: None   Collection Time: 11/16/20  1:17 AM  Specimen: STOOL  Result Value Ref Range Status   C Diff antigen NEGATIVE NEGATIVE Final   C Diff toxin NEGATIVE NEGATIVE Final   C Diff interpretation No C. difficile detected.  Final    Comment: Performed at [facility], [place] [address], [place], [place] [address]         Radiology Studies: No results found.      Scheduled Meds: . aspirin EC  81 mg Oral Daily  . atorvastatin  40 mg Oral Daily  . DULoxetine  60 mg Oral Daily  . folic acid  1 mg Oral Daily  . heparin  5,000 Units Subcutaneous Q8H  . insulin aspart  0-20 Units Subcutaneous TID WC  . insulin aspart  0-5 Units Subcutaneous QHS  . insulin aspart  9 Units Subcutaneous TID WC  . insulin glargine  30 Units Subcutaneous QHS  . losartan  50 mg Oral Daily  . mirtazapine  15 mg Oral QHS  . multivitamin with minerals  1 tablet Oral Daily  . nicotine  7 mg Transdermal Daily  . pantoprazole  40 mg Oral Daily  . saccharomyces boulardii  250 mg Oral BID   Continuous Infusions: . sodium chloride Stopped (11/16/20 0134)  . sodium chloride 10 mL/hr at 11/17/20 2200  . ceFEPime (MAXIPIME) IV 2 g (11/18/20 1000)  . lactated ringers 10 mL/hr at 11/15/20 0900  . vancomycin Stopped (11/18/20 0115)     LOS: 5  days    Time spent: 66mins    Kathie Dike, MD [facility]   If 7PM-7AM, please contact night-coverage [email or web address]  11/18/2020, 12:29 PM

## 2020-11-18 NOTE — Progress Notes (Signed)
PHARMACY CONSULT NOTE FOR:  OUTPATIENT  PARENTERAL ANTIBIOTIC THERAPY (OPAT)  Indication: osteomyelitis  Regimen: cefepime 2g IV q8h AND vancomycin 1250mg  IV q12h End date: 12/27/2020  IV antibiotic discharge orders are pended. To discharging provider:  please sign these orders via discharge navigator,  Select New Orders & click on the button choice - Manage This Unsigned Work.     Thank you for allowing pharmacy to be a part of this patient's care.  Phillis Haggis 11/18/2020, 3:36 PM

## 2020-11-18 NOTE — TOC Progression Note (Addendum)
Transition of Care [facility]) - Progression Note    Patient Details  Name: [name] [name] MRN: [id number] Date of Birth: [birth year]  [facility] CM/SW [place], [place] Phone Number: 11/18/2020, 1:28 PM  Clinical Narrative:    CSW spoke with financial counseling who noted they will look into this pt and follow back up. Pt currently has no insurance and no bed offers. SW will continue to follow for DC planning. CSW requested a CIR consult as they may take pt with no insurance.   Expected Discharge Plan: Home/Self Care Barriers to Discharge: Continued Medical Work up  Expected Discharge Plan and Services Expected Discharge Plan: Home/Self Care In-house Referral: Clinical Social Work     Living arrangements for the past 2 months: [place] Determinants of Health (SDOH) Interventions    Readmission Risk Interventions Readmission Risk Prevention Plan 11/14/2020  Transportation Screening Complete  HRI or [place] Complete  Social Work Consult for [place] Planning/Counseling Complete  Palliative Care Screening Not Applicable  Medication Review Press photographer) Complete  Some recent data might be hidden

## 2020-11-19 LAB — GLUCOSE, CAPILLARY
Glucose-Capillary: 237 mg/dL — ABNORMAL HIGH (ref 70–99)
Glucose-Capillary: 292 mg/dL — ABNORMAL HIGH (ref 70–99)
Glucose-Capillary: 179 mg/dL — ABNORMAL HIGH (ref 70–99)
Glucose-Capillary: 312 mg/dL — ABNORMAL HIGH (ref 70–99)

## 2020-11-19 LAB — VANCOMYCIN, TROUGH: Vancomycin Tr: 8 ug/mL — ABNORMAL LOW (ref 15–20)

## 2020-11-19 LAB — VANCOMYCIN, RANDOM: Vancomycin Rm: 15

## 2020-11-19 MED ORDER — SODIUM CHLORIDE 0.9% FLUSH: 10.0000 mL | INTRAVENOUS | Status: DC | PRN

## 2020-11-19 MED ORDER — VANCOMYCIN HCL 1500 MG/300ML IV SOLN
1500.0000 mg | Freq: Two times a day (BID) | INTRAVENOUS | Status: DC
  Administered 2020-11-20 – 2020-11-26 (×13): 1500 mg via INTRAVENOUS
  Filled 2020-11-19 (×14): qty 300

## 2020-11-19 MED ORDER — CHLORHEXIDINE GLUCONATE CLOTH 2 % EX PADS
6.0000 | MEDICATED_PAD | Freq: Every day | CUTANEOUS | Status: DC
  Administered 2020-11-19 – 2020-11-26 (×8): 6 via TOPICAL

## 2020-11-19 MED ORDER — INSULIN GLARGINE 100 UNIT/ML ~~LOC~~ SOLN
40.0000 [IU] | Freq: Every day | SUBCUTANEOUS | Status: DC
  Administered 2020-11-19: 40 [IU] via SUBCUTANEOUS
  Filled 2020-11-19 (×2): qty 0.4

## 2020-11-19 NOTE — Progress Notes (Signed)
Patient has been observed eating other things brought to him by his mother.  Most recent blood glucose 292

## 2020-11-19 NOTE — Progress Notes (Signed)
Physical Therapy Treatment Patient Details Name: MURLE OTTING MRN: [id number] DOB: 12-31-1960 Today's Date: 11/19/2020    History of Present Illness Patient is a 60 y/o male presented to ED from wound care center for concerns of cellulitis of L great toe with ulceration and uncontrolled diabetes. Patient now s/p L transmetatarsal amputation on 3/18. PMH: depression, uncontrolled DM, GERD, hep C, neuropathy    PT Comments    Pt continue to progress with ambulation tolerance and was able to safely negotiate platform step to mimic home set up with RW. Pt functioning mod I with bed mobility and supervision for OOB mobility. Unsure if pt's boarding house would allow him to return with IV antibiotics however from mobility stand point pt could return home. Pt has no other home as an option for d/c. IV antibiotic appears to be the conundrum for d/c. Acute PT to cont to follow.    Follow Up Recommendations  SNF;Supervision for mobility/OOB (pt could go home from functional stand point if pt's housing allows or can help with managing patients IV antibiotics)     Equipment Recommendations  Rolling walker with 5" wheels;3in1 (PT);Wheelchair (measurements PT);Wheelchair cushion (measurements PT)    Recommendations for Other Services       Precautions / Restrictions Precautions Precautions: Fall Precaution Comments: L foot trans met amp with wound vac Restrictions Weight Bearing Restrictions: Yes LLE Weight Bearing: Non weight bearing    Mobility  Bed Mobility Overal bed mobility: Modified Independent                  Transfers Overall transfer level: Needs assistance Equipment used: Rolling walker (2 wheeled) Transfers: Sit to/from Stand Sit to Stand: Supervision         General transfer comment: supervision for safety and wound vac management  Ambulation/Gait Ambulation/Gait assistance: Min guard Gait Distance (Feet): 60 Feet Assistive device: Rolling walker (2  wheeled) Gait Pattern/deviations: Step-to pattern Gait velocity: slow Gait velocity interpretation: <1.31 ft/sec, indicative of household ambulator General Gait Details: hop to pattern, pt able to maintain L LE NWB, seated rest break s/p 60 feet   Stairs Stairs: Yes Stairs assistance: Min guard Stair Management: Step to pattern;Backwards;Forwards;With walker Number of Stairs: 1 (x3 trials) General stair comments: pt reports having 1 step to enter home and then 1 step to access bathroom/bedroom, pt practiced going up backwards with rolling walker on platform step and then descending forward. pt with good return technique   Wheelchair Mobility    Modified Rankin (Stroke Patients Only)       Balance Overall balance assessment: Mild deficits observed, not formally tested                                          Cognition Arousal/Alertness: Awake/alert Behavior During Therapy: WFL for tasks assessed/performed Overall Cognitive Status: Within Functional Limits for tasks assessed                                        Exercises      General Comments General comments (skin integrity, edema, etc.): wound vac present and working      Pertinent Vitals/Pain Pain Assessment: 0-10 Pain Score: 4  Pain Location: L foot Pain Descriptors / Indicators: Discomfort Pain Intervention(s): Monitored during session    Home Living  Prior Function            PT Goals (current goals can now be found in the care plan section) Acute Rehab PT Goals Patient Stated Goal: go to rehab Progress towards PT goals: Progressing toward goals    Frequency    Min 4X/week      PT Plan Frequency needs to be updated    Co-evaluation              AM-PAC PT "6 Clicks" Mobility   Outcome Measure  Help needed turning from your back to your side while in a flat bed without using bedrails?: None Help needed moving from lying  on your back to sitting on the side of a flat bed without using bedrails?: None Help needed moving to and from a bed to a chair (including a wheelchair)?: A Little Help needed standing up from a chair using your arms (e.g., wheelchair or bedside chair)?: A Little Help needed to walk in hospital room?: A Little Help needed climbing 3-5 steps with a railing? : A Little 6 Click Score: 20    End of Session Equipment Utilized During Treatment: Gait belt Activity Tolerance: Patient tolerated treatment well Patient left: in chair;with call bell/phone within reach Nurse Communication: Mobility status PT Visit Diagnosis: Unsteadiness on feet (R26.81);Muscle weakness (generalized) (M62.81);Other abnormalities of gait and mobility (R26.89)     Time: 5072-2575 PT Time Calculation (min) (ACUTE ONLY): 34 min  Charges:  $Gait Training: 23-37 mins                     Kittie Plater, PT, DPT Acute Rehabilitation Services Pager #: [phone] Office #: [phone]    Berline Lopes 11/19/2020, 2:32 PM

## 2020-11-19 NOTE — Progress Notes (Signed)
Peripherally Inserted Central Catheter Placement  The IV Nurse has discussed with the patient and/or persons authorized to consent for the patient, the purpose of this procedure and the potential benefits and risks involved with this procedure.  The benefits include less needle sticks, lab draws from the catheter, and the patient may be discharged home with the catheter. Risks include, but not limited to, infection, bleeding, blood clot (thrombus formation), and puncture of an artery; nerve damage and irregular heartbeat and possibility to perform a PICC exchange if needed/ordered by physician.  Alternatives to this procedure were also discussed.  Bard Power PICC patient education guide, fact sheet on infection prevention and patient information card has been provided to patient /or left at bedside.    PICC Placement Documentation  PICC Single Lumen 11/19/20 PICC Right Brachial 36 cm 0 cm (Active)  Indication for Insertion or Continuance of Line Prolonged intravenous therapies 11/19/20 0852  Exposed Catheter (cm) 0 cm 11/19/20 9276  Site Assessment Clean;Dry;Intact 11/19/20 0852  Line Status Flushed;Blood return noted 11/19/20 0852  Dressing Type Transparent 11/19/20 0852  Dressing Status Clean;Dry;Intact 11/19/20 0852  Antimicrobial disc in place? Yes 11/19/20 0852  Dressing Change Due 11/26/20 11/19/20 0852       Scotty Court 11/19/2020, 8:54 AM

## 2020-11-19 NOTE — Progress Notes (Signed)
Occupational Therapy Treatment Patient Details Name: [name] [name] MRN: [id number] DOB: [birth year] Today's Date: 11/19/2020    History of present illness Patient is a 60 y/o male presented to ED from wound care center for concerns of cellulitis of L great toe with ulceration and uncontrolled diabetes. Patient now s/p L transmetatarsal amputation on 3/18. PMH: depression, uncontrolled DM, GERD, hep C, neuropathy   OT comments  Pt making steady progress towards OT goals this session. Pt continues to present with impaired balance, decreased activity tolerance and WB restricitions. Education provided on all LB AE for bathing and dressing however pt able to reach LB from EOB with supervision, do recommend use of reacher to decrease risk of falls. Pt completed household distance functional mobiltiy with RW and min A for balance, pt with good adherence to WB restrictions throughout session. Pt would continue to benefit from skilled occupational therapy while admitted and after d/c to address the below listed limitations in order to improve overall functional mobility and facilitate independence with BADL participation. DC plan remains appropriate, will follow acutely per POC.     Follow Up Recommendations  SNF    Equipment Recommendations  3 in 1 bedside commode;Wheelchair (measurements OT);Wheelchair cushion (measurements OT);Other (comment) (RW, reacher)    Recommendations for Other Services      Precautions / Restrictions Precautions Precautions: Fall Precaution Comments: L foot trans met amp with wound vac Required Braces or Orthoses: Other Brace Restrictions Weight Bearing Restrictions: No LLE Weight Bearing: Non weight bearing       Mobility Bed Mobility Overal bed mobility: Modified Independent                  Transfers Overall transfer level: Needs assistance Equipment used: Rolling walker (2 wheeled) Transfers: Sit to/from Stand Sit to Stand: Min guard;From  elevated surface         General transfer comment: min guard for safety    Balance Overall balance assessment: Needs assistance Sitting-balance support: No upper extremity supported;Feet supported Sitting balance-Leahy Scale: Fair Sitting balance - Comments: able to complete LB ADLs from EOB with no LOB   Standing balance support: Bilateral upper extremity supported;During functional activity Standing balance-Leahy Scale: Poor Standing balance comment: reliant on BUE support                           ADL either performed or assessed with clinical judgement   ADL Overall ADL's : Needs assistance/impaired             Lower Body Bathing: Supervison/ safety;Sitting/lateral leans Lower Body Bathing Details (indicate cue type and reason): simulated     Lower Body Dressing: Supervision/safety;Sitting/lateral leans Lower Body Dressing Details (indicate cue type and reason): education provided on LB AE however pt really doesn't need itm did issue from [facility] stand point. Education provided on dressing with wound vac. Toilet Transfer: Chief of Staff Details (indicate cue type and reason): simulated via functional mobility with RW         Functional mobility during ADLs: Min guard;Rolling walker General ADL Comments: pt continues to present with impaired balance, decreased activity tolerance and generalized deconditioning     Vision       Perception     Praxis      Cognition Arousal/Alertness: Awake/alert Behavior During Therapy: WFL for tasks assessed/performed Overall Cognitive Status: Within Functional Limits for tasks assessed  Exercises     Shoulder Instructions       General Comments wound vac intact    Pertinent Vitals/ Pain       Pain Assessment: Faces Pain Score: 4  Faces Pain Scale: Hurts a little bit Pain Location: L foot Pain Descriptors / Indicators:  Aching Pain Intervention(s): Monitored during session  Home Living                                          Prior Functioning/Environment              Frequency  Min 2X/week        Progress Toward Goals  OT Goals(current goals can now be found in the care plan section)  Progress towards OT goals: Progressing toward goals  Acute Rehab OT Goals Patient Stated Goal: go to rehab OT Goal Formulation: With patient Time For Goal Achievement: 11/30/20 Potential to Achieve Goals: Good  Plan Discharge plan remains appropriate;Frequency remains appropriate    Co-evaluation                 AM-PAC OT "6 Clicks" Daily Activity     Outcome Measure   Help from another person eating meals?: None Help from another person taking care of personal grooming?: A Little Help from another person toileting, which includes using toliet, bedpan, or urinal?: A Little Help from another person bathing (including washing, rinsing, drying)?: A Little Help from another person to put on and taking off regular upper body clothing?: None Help from another person to put on and taking off regular lower body clothing?: A Little 6 Click Score: 20    End of Session Equipment Utilized During Treatment: Gait belt;Rolling walker  OT Visit Diagnosis: Unsteadiness on feet (R26.81);Other abnormalities of gait and mobility (R26.89);Pain Pain - Right/Left: Left Pain - part of body: Ankle and joints of foot   Activity Tolerance Patient tolerated treatment well   Patient Left in bed;with call bell/phone within reach;with bed alarm set   Nurse Communication Mobility status        Time: 0349-6116 OT Time Calculation (min): 21 min  Charges: OT General Charges $OT Visit: 1 Visit OT Treatments $Self Care/Home Management : 8-22 mins  [name] [place]., COTA/L Acute Rehabilitation Services [phone]    [name] [name] 11/19/2020, 4:38 PM

## 2020-11-19 NOTE — Plan of Care (Signed)

## 2020-11-19 NOTE — Progress Notes (Signed)
IV team notifed via order for STAT Vancomycin trough

## 2020-11-19 NOTE — Progress Notes (Signed)
Pharmacy Antibiotic Note  [name] [name] is a 60 y.o. male with Staph epi and pseudomonas LLE osteomyelitis. Pharmacy has been consulted for vancomycin and cefepime dosing.  MRI on 3/17 revealed osteomyelitis and patient had left transmetatarsal amputation 3/18. Plan to continue antibiotic therapy for 6 weeks per ID, end 4/29.  3/22 VT = 8 3/22 VR = 15  Vanc ~404 AUC   Plan: Increase Vancomycin to 1500 mg IV every 12 hours (eAUC 483.5) Cefepime 2 g IV every 8 hours F/u weekly vanc levels, renal function  OPAT done 3/21, end date 12/27/20  Height: 5\' 11"  (180.3 cm) Weight: 74.5 kg (164 lb 3.9 oz) IBW/kg (Calculated) : 75.3  Temp (24hrs), Avg:98.1 F (36.7 C), Min:97.5 F (36.4 C), Max:98.4 F (36.9 C)  Recent Labs  Lab 11/13/20 1559 11/13/20 1738 11/14/20 0307 11/15/20 0416 11/16/20 0123 11/17/20 0131 11/19/20 1209 11/19/20 1936  WBC 12.4*  --  13.6* 9.1 13.3* 9.4  --   --   CREATININE 0.75  --  0.57* 0.51* 0.58* 0.45*  --   --   LATICACIDVEN  --  2.0*  --   --   --   --   --   --   VANCOTROUGH  --   --   --   --   --   --  8*  --   VANCORANDOM  --   --   --   --   --   --   --  15    Estimated Creatinine Clearance: 106.1 mL/min (A) (by C-G formula based on SCr of 0.45 mg/dL (L)).    Allergies  Allergen Reactions  . Bee Venom Anaphylaxis  . Lactose Intolerance (Gi) Other (See Comments)    GI upset    Antimicrobials this admission: 3/16 Zosyn x1 3/16 Vancomycin >>  3/17 Cefepime >>  3/17 Metronidazole >> 3/19  Microbiology results: 2/28 Deep surgical wound (foot): Klebsiella oxytoca- sensitive except ampicillin/unasyn 3/17 MRSA PCR: negative 3/18 L foot tissue: PSA (S-ceftaz, cipro), staph epi (S-cipro, vanc) 3/19 C.Diff: negative x2   Thank you for allowing [place] to participate in this patients care.  Jens Som, PharmD 11/19/2020 9:15 PM  Please check AMION for all [facility] phone numbers After 10:00 PM, call [place] [phone]

## 2020-11-19 NOTE — Progress Notes (Signed)
PROGRESS NOTE    [name] [name]  UVO:[id number] DOB: [birth year] DOA: 11/13/2020 PCP: [name], NP    Brief Narrative:  [name] [name] is a 60 y.o. male with a history of diabetes mellitus, hypertension, chronic hepatitis C, GERD. Patient presented secondary to worsening left foot wound. Empiric Vancomycin and cefepime started on admission. MRI obtained which confirmed osteomyelitis and abscess. Orthopedic surgery consulted.  He was transitioned to [name] and underwent transmetatarsal amputation on 3/18   Assessment & Plan:   Principal Problem:   Osteomyelitis ([place]) Active Problems:   Chronic hepatitis C without hepatic coma (HCC)   Hepatic cirrhosis (HCC)   Type 2 diabetes mellitus with hyperlipidemia ([place])   Essential hypertension   Cellulitis of left foot   Cellulitis of lower extremity   Cellulitis   Foot abscess, left   Diabetic foot infection/abscess Left leg cellulitis Osteomyelitis/septic arthritis Patient started empirically on Vancomycin and Cefepime on admission. MRI of left foot significant for evidence of osteomyelitis of multiple sites in addition to new abscess. MRI of tibia/fibula significant for evidence of likely cellulitis. Mild leukocytosis. -Orthopedic surgery consulted -Patient was transferred to [facility] and underwent transmetatarsal amputation on 3/18 -Continue Vancomycin and Cefepime -intraoperative culture positive for pseudomonas and staph epi -discontinue flagyl -continue vanc/cefepime for now -appreciate ID input -PICC line placed on 3/22 with plans for 4-6 weeks of antibiotics -follow up with ID has been scheduled  Diabetes mellitus, type 2 Patient is on Invokana and glipizide as an outpatient. Hemoglobin A1C of 11.2% from 2/22. Started on Lantus inpatient -Blood sugars remain elevated -Increased lantus to 40 units daily and novolog to 9 units with meals -continue resistant SSI   Hyponatremia Mild. Sodium of 127 on  admission  Likely contributed to by hyperglycemia and resultant pseudohyponatremia. Overall sodium has improved  Primary hypertension -Continue losartan  History of liver cirrhosis History of alcohol abuse Not currently drinking alcohol. On chart review, ultrasound in 2016 suggested possible fibrosis. MRI of abdomen in 2018 suggested early cirrhosis with no mention of fibrosis. -Continue folate, multivitamin  Chronic anemia Chronic, mild and stable.  Hyperlipidemia -Continue Lipitor 40 mg daily  Depression -Continue Cymbalta, Remeron  GERD -Continue PPI   Dispo Patient reports that his living situation at a boarding house would not be conducive to appropriate wound healing with wound VAC Although his mother is involved in his care, she does not have adequate accommodations to care for him in the postop setting TOC consulted to see about possible placement since he has continued wound care needs with wound VAC Seen by PT/OT with recommendations for SNF   DVT prophylaxis: SCDs Start: 11/15/20 1032 heparin injection 5,000 Units Start: 11/13/20 2200  Code Status: DNR Family Communication: Updated patient's mother at the bedside Disposition Plan: Status is: Inpatient  Remains inpatient appropriate because:Inpatient level of care appropriate due to severity of illness   Dispo: The patient is from: [place]              Anticipated d/c is to: TBD              Patient currently is not medically stable to d/c.   Difficult to place patient No     Consultants:   Orthopedics, Dr. Sharol Given  Procedures:  LEFT TRANSMETATARSAL AMPUTATION Placement of Prevena wound VAC.  Antimicrobials:   Cefepime 3/17 >  Vancomycin 3/17 >  Flagyl 3/17 >319   Subjective: He had 1 or 2 loose tools this morning. Reports improvement  with imodium  Objective: Vitals:   11/18/20 1228 11/18/20 2025 11/19/20 0550 11/19/20 0802  BP: 123/84 130/72 127/87 129/80  Pulse: 70 66 78  61  Resp: 18 18 18 16   Temp:  98.5 F (36.9 C) 98.4 F (36.9 C) (!) 97.5 F (36.4 C)  TempSrc:  Oral Oral Oral  SpO2: 98% 98% 100% 99%  Weight:      Height:        Intake/Output Summary (Last 24 hours) at 11/19/2020 1554 Last data filed at 11/19/2020 1014 Gross per 24 hour  Intake 980 ml  Output 1201 ml  Net -221 ml   Filed Weights   11/13/20 1546 11/14/20 0057 11/15/20 0842  Weight: 74.4 kg 74.5 kg 74.5 kg    Examination:  General exam: Alert, awake, oriented x 3 Respiratory system: Clear to auscultation. Respiratory effort normal. Cardiovascular system:RRR. No murmurs, rubs, gallops. Gastrointestinal system: Abdomen is nondistended, soft and nontender. No organomegaly or masses felt. Normal bowel sounds heard. Central nervous system: Alert and oriented. No focal neurological deficits. Extremities: LLE TMA with wound vac Skin: No rashes, lesions or ulcers Psychiatry: Judgement and insight appear normal. Mood & affect appropriate.       Data Reviewed: I have personally reviewed following labs and imaging studies  CBC: Recent Labs  Lab 11/13/20 1559 11/14/20 0307 11/15/20 0416 11/16/20 0123 11/17/20 0131  WBC 12.4* 13.6* 9.1 13.3* 9.4  NEUTROABS 8.4* 8.0*  --   --   --   HGB 11.8* 11.5* 13.5 12.4* 12.7*  HCT 36.3* 35.4* 40.0 37.4* 38.5*  MCV 91.7 91.7 88.1 89.7 89.3  PLT 370 336 381 357 644   Basic Metabolic Panel: Recent Labs  Lab 11/13/20 1559 11/14/20 0307 11/15/20 0416 11/16/20 0123 11/17/20 0131  NA 127* 132* 131* 131* 133*  K 4.0 3.9 4.2 3.8 4.1  CL 95* 103 102 103 101  CO2 22 20* 21* 25 27  GLUCOSE 469* 314* 307* 268* 229*  BUN 12 11 8 9 8   CREATININE 0.75 0.57* 0.51* 0.58* 0.45*  CALCIUM 8.4* 8.2* 8.8* 8.2* 8.5*   GFR: Estimated Creatinine Clearance: 106.1 mL/min (A) (by C-G formula based on SCr of 0.45 mg/dL (L)). Liver Function Tests: Recent Labs  Lab 11/13/20 1559 11/14/20 0307 11/15/20 0416  AST 27 21 26   ALT 21 16 22   ALKPHOS  77 66 71  BILITOT 0.6 0.5 0.8  PROT 7.5 6.6 7.3  ALBUMIN 3.4* 2.8* 2.8*   No results for input(s): LIPASE, AMYLASE in the last 168 hours. No results for input(s): AMMONIA in the last 168 hours. Coagulation Profile: Recent Labs  Lab 11/15/20 0416  INR 1.0   Cardiac Enzymes: No results for input(s): CKTOTAL, CKMB, CKMBINDEX, TROPONINI in the last 168 hours. BNP (last 3 results) No results for input(s): PROBNP in the last 8760 hours. HbA1C: No results for input(s): HGBA1C in the last 72 hours. CBG: Recent Labs  Lab 11/18/20 1224 11/18/20 1602 11/18/20 2107 11/19/20 0604 11/19/20 1159  GLUCAP 182* 148* 254* 237* 292*   Lipid Profile: No results for input(s): CHOL, HDL, LDLCALC, TRIG, CHOLHDL, LDLDIRECT in the last 72 hours. Thyroid Function Tests: No results for input(s): TSH, T4TOTAL, FREET4, T3FREE, THYROIDAB in the last 72 hours. Anemia Panel: No results for input(s): VITAMINB12, FOLATE, FERRITIN, TIBC, IRON, RETICCTPCT in the last 72 hours. Sepsis Labs: Recent Labs  Lab 11/13/20 1738  LATICACIDVEN 2.0*    Recent Results (from the past 240 hour(s))  SARS CORONAVIRUS 2 (TAT 6-24 HRS)  Nasopharyngeal Nasopharyngeal Swab     Status: None   Collection Time: 11/13/20  9:37 PM   Specimen: Nasopharyngeal Swab  Result Value Ref Range Status   SARS Coronavirus 2 NEGATIVE NEGATIVE Final    Comment: (NOTE) SARS-CoV-2 target nucleic acids are NOT DETECTED.  The SARS-CoV-2 RNA is generally detectable in upper and lower respiratory specimens during the acute phase of infection. Negative results do not preclude SARS-CoV-2 infection, do not rule out co-infections with other pathogens, and should not be used as the sole basis for treatment or other patient management decisions. Negative results must be combined with clinical observations, patient history, and epidemiological information. The expected result is Negative.  Fact Sheet for  Patients: SugarRoll.be  Fact Sheet for Healthcare Providers: https://www.woods-mathews.com/  This test is not yet approved or cleared by the [place] FDA and  has been authorized for detection and/or diagnosis of SARS-CoV-2 by FDA under an Emergency Use Authorization (EUA). This EUA will remain  in effect (meaning this test can be used) for the duration of the COVID-19 declaration under Se ction 564(b)(1) of the Act, 21 U.S.C. section 360bbb-3(b)(1), unless the authorization is terminated or revoked sooner.  Performed at [facility], [place] [address], [place], [place] [address]   MRSA PCR Screening     Status: None   Collection Time: 11/14/20 11:16 PM   Specimen: Nasal Mucosa; Nasopharyngeal  Result Value Ref Range Status   MRSA by PCR NEGATIVE NEGATIVE Final    Comment:        The GeneXpert MRSA Assay (FDA approved for NASAL specimens only), is one component of a comprehensive MRSA colonization surveillance program. It is not intended to diagnose MRSA infection nor to guide or monitor treatment for MRSA infections. Performed at [facility], [place] [address] [place], [place] [address]   Aerobic/Anaerobic Culture w Gram Stain (surgical/deep wound)     Status: None   Collection Time: 11/15/20  9:20 AM   Specimen: PATH Other; Tissue  Result Value Ref Range Status   Specimen Description TISSUE  Final   Special Requests LEFT FOOT SPEC A  Final   Gram Stain   Final    ABUNDANT WBC PRESENT, PREDOMINANTLY PMN FEW GRAM POSITIVE COCCI RARE GRAM NEGATIVE RODS    Culture   Final    ABUNDANT PSEUDOMONAS AERUGINOSA ABUNDANT STAPHYLOCOCCUS EPIDERMIDIS NO ANAEROBES ISOLATED Performed at [facility], [place] [address], [place], [place] [address]    Report Status 11/18/2020 FINAL  Final   Organism ID, Bacteria PSEUDOMONAS AERUGINOSA  Final   Organism ID, Bacteria STAPHYLOCOCCUS EPIDERMIDIS  Final      Susceptibility    Pseudomonas aeruginosa - MIC*    CEFTAZIDIME 4 SENSITIVE Sensitive     CIPROFLOXACIN 0.5 SENSITIVE Sensitive     GENTAMICIN <=1 SENSITIVE Sensitive     IMIPENEM 2 SENSITIVE Sensitive     * ABUNDANT PSEUDOMONAS AERUGINOSA   Staphylococcus epidermidis - MIC*    CIPROFLOXACIN <=0.5 SENSITIVE Sensitive     ERYTHROMYCIN >=8 RESISTANT Resistant     GENTAMICIN <=0.5 SENSITIVE Sensitive     OXACILLIN >=4 RESISTANT Resistant     TETRACYCLINE 2 SENSITIVE Sensitive     VANCOMYCIN 2 SENSITIVE Sensitive     TRIMETH/SULFA 160 RESISTANT Resistant     CLINDAMYCIN >=8 RESISTANT Resistant     RIFAMPIN <=0.5 SENSITIVE Sensitive     Inducible Clindamycin NEGATIVE Sensitive     * ABUNDANT STAPHYLOCOCCUS EPIDERMIDIS  C Difficile Quick Screen w  PCR reflex     Status: None   Collection Time: 11/16/20  1:17 AM   Specimen: STOOL  Result Value Ref Range Status   C Diff antigen NEGATIVE NEGATIVE Final   C Diff toxin NEGATIVE NEGATIVE Final   C Diff interpretation No C. difficile detected.  Final    Comment: Performed at [facility], [place] [address], [place], [place] [address]         Radiology Studies: [place] EKG SITE RITE  Result Date: 11/18/2020 If [facility] image not attached, placement could not be confirmed due to current cardiac rhythm.       Scheduled Meds: . aspirin EC  81 mg Oral Daily  . atorvastatin  40 mg Oral Daily  . Chlorhexidine Gluconate Cloth  6 each Topical Daily  . DULoxetine  60 mg Oral Daily  . folic acid  1 mg Oral Daily  . heparin  5,000 Units Subcutaneous Q8H  . insulin aspart  0-20 Units Subcutaneous TID WC  . insulin aspart  0-5 Units Subcutaneous QHS  . insulin aspart  9 Units Subcutaneous TID WC  . insulin glargine  40 Units Subcutaneous QHS  . losartan  50 mg Oral Daily  . mirtazapine  15 mg Oral QHS  . multivitamin with minerals  1 tablet Oral Daily  . nicotine  7 mg Transdermal Daily  . pantoprazole  40 mg Oral Daily  . saccharomyces boulardii  250 mg  Oral BID   Continuous Infusions: . sodium chloride Stopped (11/16/20 0134)  . sodium chloride 10 mL/hr at 11/17/20 2200  . ceFEPime (MAXIPIME) IV 2 g (11/19/20 0945)  . lactated ringers 10 mL/hr at 11/15/20 0900  . vancomycin 1,250 mg (11/19/20 1250)     LOS: 6 days    Time spent: 61mins    Kathie Dike, MD [facility]   If 7PM-7AM, please contact night-coverage [email or web address]  11/19/2020, 3:54 PM

## 2020-11-19 NOTE — Progress Notes (Signed)
Subjective: No new complaints   Antibiotics:  Anti-infectives (From admission, onward)   Start     Dose/Rate Route Frequency Ordered Stop   11/15/20 1500  ceFAZolin (ANCEF) IVPB 2g/100 mL premix        2 g 200 mL/hr over 30 Minutes Intravenous On call to O.R. 11/14/20 2211 11/15/20 0939   11/14/20 1400  metroNIDAZOLE (FLAGYL) tablet 500 mg  Status:  Discontinued        500 mg Oral Every 8 hours 11/14/20 1207 11/16/20 1631   11/14/20 1200  vancomycin (VANCOREADY) IVPB 1250 mg/250 mL        1,250 mg 166.7 mL/hr over 90 Minutes Intravenous Every 12 hours 11/14/20 1132     11/14/20 1000  vancomycin (VANCOCIN) IVPB 1000 mg/200 mL premix  Status:  Discontinued        1,000 mg 200 mL/hr over 60 Minutes Intravenous Every 12 hours 11/13/20 2303 11/14/20 1132   11/14/20 0200  ceFEPIme (MAXIPIME) 2 g in sodium chloride 0.9 % 100 mL IVPB        2 g 200 mL/hr over 30 Minutes Intravenous Every 8 hours 11/13/20 2135     11/13/20 2145  vancomycin (VANCOREADY) IVPB 1750 mg/350 mL        1,750 mg 175 mL/hr over 120 Minutes Intravenous  Once 11/13/20 2133 11/14/20 0041   11/13/20 2130  vancomycin (VANCOCIN) IVPB 1000 mg/200 mL premix  Status:  Discontinued        1,000 mg 200 mL/hr over 60 Minutes Intravenous  Once 11/13/20 2127 11/13/20 2133   11/13/20 2130  ceFEPIme (MAXIPIME) 2 g in sodium chloride 0.9 % 100 mL IVPB  Status:  Discontinued        2 g 200 mL/hr over 30 Minutes Intravenous  Once 11/13/20 2127 11/13/20 2135   11/13/20 1845  piperacillin-tazobactam (ZOSYN) IVPB 3.375 g        3.375 g 100 mL/hr over 30 Minutes Intravenous  Once 11/13/20 1833 11/14/20 0041      Medications: Scheduled Meds: . aspirin EC  81 mg Oral Daily  . atorvastatin  40 mg Oral Daily  . Chlorhexidine Gluconate Cloth  6 each Topical Daily  . DULoxetine  60 mg Oral Daily  . folic acid  1 mg Oral Daily  . heparin  5,000 Units Subcutaneous Q8H  . insulin aspart  0-20 Units Subcutaneous TID WC  .  insulin aspart  0-5 Units Subcutaneous QHS  . insulin aspart  9 Units Subcutaneous TID WC  . insulin glargine  40 Units Subcutaneous QHS  . losartan  50 mg Oral Daily  . mirtazapine  15 mg Oral QHS  . multivitamin with minerals  1 tablet Oral Daily  . nicotine  7 mg Transdermal Daily  . pantoprazole  40 mg Oral Daily  . saccharomyces boulardii  250 mg Oral BID   Continuous Infusions: . sodium chloride Stopped (11/16/20 0134)  . sodium chloride 10 mL/hr at 11/17/20 2200  . ceFEPime (MAXIPIME) IV 2 g (11/19/20 0945)  . lactated ringers 10 mL/hr at 11/15/20 0900  . vancomycin Stopped (11/19/20 0129)   PRN Meds:.acetaminophen **OR** acetaminophen, albuterol, HYDROmorphone (DILAUDID) injection, loperamide, metoCLOPramide **OR** metoCLOPramide (REGLAN) injection, ondansetron **OR** ondansetron (ZOFRAN) IV, oxyCODONE, sodium chloride flush    Objective: Weight change:   Intake/Output Summary (Last 24 hours) at 11/19/2020 1231 Last data filed at 11/19/2020 1014 Gross per 24 hour  Intake 980 ml  Output 1201 ml  Net -  221 ml   Blood pressure 129/80, pulse 61, temperature (!) 97.5 F (36.4 C), temperature source Oral, resp. rate 16, height 5\' 11"  (1.803 m), weight 74.5 kg, SpO2 99 %. Temp:  [97.5 F (36.4 C)-98.5 F (36.9 C)] 97.5 F (36.4 C) (03/22 0802) Pulse Rate:  [61-78] 61 (03/22 0802) Resp:  [16-18] 16 (03/22 0802) BP: (127-130)/(72-87) 129/80 (03/22 0802) SpO2:  [98 %-100 %] 99 % (03/22 0802)  Physical Exam: Physical Exam Constitutional:      Appearance: He is well-developed.  HENT:     Head: Normocephalic and atraumatic.  Eyes:     Extraocular Movements: Extraocular movements intact.     Conjunctiva/sclera: Conjunctivae normal.  Cardiovascular:     Rate and Rhythm: Normal rate and regular rhythm.  Pulmonary:     Effort: Pulmonary effort is normal. No respiratory distress.     Breath sounds: No wheezing.  Abdominal:     General: There is no distension.      Palpations: Abdomen is soft.  Musculoskeletal:        General: Normal range of motion.     Cervical back: Normal range of motion and neck supple.  Skin:    General: Skin is warm and dry.     Findings: No erythema or rash.  Neurological:     General: No focal deficit present.     Mental Status: He is alert and oriented to person, place, and time.  Psychiatric:        Mood and Affect: Mood normal.        Behavior: Behavior normal.        Thought Content: Thought content normal.        Judgment: Judgment normal.     Foot bandaged  PICC line in place.  CBC:    BMET Recent Labs    11/17/20 0131  NA 133*  K 4.1  CL 101  CO2 27  GLUCOSE 229*  BUN 8  CREATININE 0.45*  CALCIUM 8.5*     Liver Panel  No results for input(s): PROT, ALBUMIN, AST, ALT, ALKPHOS, BILITOT, BILIDIR, IBILI in the last 72 hours.     Sedimentation Rate No results for input(s): ESRSEDRATE in the last 72 hours. C-Reactive Protein No results for input(s): CRP in the last 72 hours.  Micro Results: Recent Results (from the past 720 hour(s))  Culture, blood (Routine x 2)     Status: None   Collection Time: 10/28/20  9:30 AM   Specimen: BLOOD  Result Value Ref Range Status   Specimen Description   Final    BLOOD RIGHT ANTECUBITAL Performed at [facility] [address], [place], [place] [address]    Special Requests   Final    BOTTLES DRAWN AEROBIC AND ANAEROBIC Blood Culture adequate volume Performed at [facility] [address] [place], [place] [address]    Culture   Final    NO GROWTH 5 DAYS Performed at [facility], [place] [address], [place], [place] [address]    Report Status 11/02/2020 FINAL  Final  Culture, blood (Routine x 2)     Status: Abnormal   Collection Time: 10/28/20  9:35 AM   Specimen: BLOOD  Result Value Ref Range Status   Specimen Description   Final    BLOOD LEFT ANTECUBITAL Performed at [facility], [place] [address] [place], [place] [address]    Special Requests   Final    BOTTLES DRAWN AEROBIC AND  ANAEROBIC Blood Culture adequate volume Performed at [facility] [address], [place], [place] [address]    Culture  Setup Time   Final    GRAM POSITIVE COCCI IN CHAINS ANAEROBIC BOTTLE ONLY CRITICAL RESULT CALLED TO, READ BACK BY AND VERIFIED WITH: PHARMD DREW W. 2031 [id number] FCP    Culture (A)  Final    GROUP A STREP (S.PYOGENES) ISOLATED HEALTH DEPARTMENT NOTIFIED Performed at [facility], [place] [address], [place], [place] [address]    Report Status 10/31/2020 FINAL  Final   Organism ID, Bacteria GROUP A STREP (S.PYOGENES) ISOLATED  Final      Susceptibility   Group a strep (s.pyogenes) isolated - MIC*    PENICILLIN <=0.06 SENSITIVE Sensitive     CEFTRIAXONE <=0.12 SENSITIVE Sensitive     ERYTHROMYCIN >=8 RESISTANT Resistant     LEVOFLOXACIN 0.5 SENSITIVE Sensitive     VANCOMYCIN <=0.12 SENSITIVE Sensitive     * GROUP A STREP (S.PYOGENES) ISOLATED  Blood Culture ID Panel (Reflexed)     Status: Abnormal   Collection Time: 10/28/20  9:35 AM  Result Value Ref Range Status   Enterococcus faecalis NOT DETECTED NOT DETECTED Final   Enterococcus Faecium NOT DETECTED NOT DETECTED Final   Listeria monocytogenes NOT DETECTED NOT DETECTED Final   Staphylococcus species NOT DETECTED NOT DETECTED Final   Staphylococcus aureus (BCID) NOT DETECTED NOT DETECTED Final   Staphylococcus epidermidis NOT DETECTED NOT DETECTED Final   Staphylococcus lugdunensis NOT DETECTED NOT DETECTED Final   Streptococcus species DETECTED (A) NOT DETECTED Final    Comment: CRITICAL RESULT CALLED TO, READ BACK BY AND VERIFIED WITH: PHARMD DREW W. 2031 [id number] FCP    Streptococcus agalactiae NOT DETECTED NOT DETECTED Final   Streptococcus pneumoniae NOT DETECTED NOT DETECTED Final   Streptococcus pyogenes DETECTED (A) NOT DETECTED Final    Comment: CRITICAL RESULT CALLED TO, READ BACK BY AND  VERIFIED WITH: PHARMD DREW W. 2031 [id number] FCP    A.calcoaceticus-baumannii NOT DETECTED NOT DETECTED Final   Bacteroides fragilis NOT DETECTED NOT DETECTED Final   Enterobacterales NOT DETECTED NOT DETECTED Final   Enterobacter cloacae complex NOT DETECTED NOT DETECTED Final   Escherichia coli NOT DETECTED NOT DETECTED Final   Klebsiella aerogenes NOT DETECTED NOT DETECTED Final   Klebsiella oxytoca NOT DETECTED NOT DETECTED Final   Klebsiella pneumoniae NOT DETECTED NOT DETECTED Final   Proteus species NOT DETECTED NOT DETECTED Final   Salmonella species NOT DETECTED NOT DETECTED Final   Serratia marcescens NOT DETECTED NOT DETECTED Final   Haemophilus influenzae NOT DETECTED NOT DETECTED Final   Neisseria meningitidis NOT DETECTED NOT DETECTED Final   Pseudomonas aeruginosa NOT DETECTED NOT DETECTED Final   Stenotrophomonas maltophilia NOT DETECTED NOT DETECTED Final   Candida albicans NOT DETECTED NOT DETECTED Final   Candida auris NOT DETECTED NOT DETECTED Final   Candida glabrata NOT DETECTED NOT DETECTED Final   Candida krusei NOT DETECTED NOT DETECTED Final   Candida parapsilosis NOT DETECTED NOT DETECTED Final   Candida tropicalis NOT DETECTED NOT DETECTED Final   Cryptococcus neoformans/gattii NOT DETECTED NOT DETECTED Final    Comment: Performed at [facility], [address] [place], [place] [address]  Resp Panel by RT-PCR (Flu A&B, Covid) Nasopharyngeal Swab     Status: None   Collection Time: 10/28/20  9:58 AM   Specimen: Nasopharyngeal Swab; Nasopharyngeal(NP) swabs in vial transport medium  Result Value Ref Range Status   SARS Coronavirus 2  by RT PCR NEGATIVE NEGATIVE Final    Comment: (NOTE) SARS-CoV-2 target nucleic acids are NOT DETECTED.  The SARS-CoV-2 RNA is generally detectable in upper respiratory specimens during the acute phase of infection. The lowest concentration of SARS-CoV-2 viral copies this assay can detect is 138 copies/mL. A negative  result does not preclude SARS-Cov-2 infection and should not be used as the sole basis for treatment or other patient management decisions. A negative result may occur with  improper specimen collection/handling, submission of specimen other than nasopharyngeal swab, presence of viral mutation(s) within the areas targeted by this assay, and inadequate number of viral copies(<138 copies/mL). A negative result must be combined with clinical observations, patient history, and epidemiological information. The expected result is Negative.  Fact Sheet for Patients:  EntrepreneurPulse.com.au  Fact Sheet for Healthcare Providers:  IncredibleEmployment.be  This test is no t yet approved or cleared by the [place] FDA and  has been authorized for detection and/or diagnosis of SARS-CoV-2 by FDA under an Emergency Use Authorization (EUA). This EUA will remain  in effect (meaning this test can be used) for the duration of the COVID-19 declaration under Section 564(b)(1) of the Act, 21 U.S.C.section 360bbb-3(b)(1), unless the authorization is terminated  or revoked sooner.       Influenza A by PCR NEGATIVE NEGATIVE Final   Influenza B by PCR NEGATIVE NEGATIVE Final    Comment: (NOTE) The Xpert Xpress SARS-CoV-2/FLU/RSV plus assay is intended as an aid in the diagnosis of influenza from Nasopharyngeal swab specimens and should not be used as a sole basis for treatment. Nasal washings and aspirates are unacceptable for Xpert Xpress SARS-CoV-2/FLU/RSV testing.  Fact Sheet for Patients: EntrepreneurPulse.com.au  Fact Sheet for Healthcare Providers: IncredibleEmployment.be  This test is not yet approved or cleared by the [place] FDA and has been authorized for detection and/or diagnosis of SARS-CoV-2 by FDA under an Emergency Use Authorization (EUA). This EUA will remain in effect (meaning this test can be used)  for the duration of the COVID-19 declaration under Section 564(b)(1) of the Act, 21 U.S.C. section 360bbb-3(b)(1), unless the authorization is terminated or revoked.  Performed at [facility], [place] [address] [place], [place] [address]   Aerobic/Anaerobic Culture w Gram Stain (surgical/deep wound)     Status: None   Collection Time: 10/28/20 10:03 AM   Specimen: Foot  Result Value Ref Range Status   Specimen Description   Final    FOOT Performed at [facility] [address] [place], [place] [address]    Special Requests   Final    NONE Performed at [facility], [place] [address], [place], [place] [address]    Gram Stain NO WBC SEEN FEW GRAM POSITIVE COCCI   Final   Culture   Final    ABUNDANT GROUP A STREP (S.PYOGENES) ISOLATED FEW KLEBSIELLA OXYTOCA Beta hemolytic streptococci are predictably susceptible to penicillin and other beta lactams. Susceptibility testing not routinely performed. NO ANAEROBES ISOLATED Performed at [facility], [place] [address], [place],  [address]    Report Status 11/02/2020 FINAL  Final   Organism ID, Bacteria KLEBSIELLA OXYTOCA  Final      Susceptibility   Klebsiella oxytoca - MIC*    AMPICILLIN >=32 RESISTANT Resistant     CEFAZOLIN 8 SENSITIVE Sensitive     CEFEPIME <=0.12 SENSITIVE Sensitive     CEFTAZIDIME <=1 SENSITIVE Sensitive     CEFTRIAXONE <=0.25 SENSITIVE Sensitive  CIPROFLOXACIN <=0.25 SENSITIVE Sensitive     GENTAMICIN <=1 SENSITIVE Sensitive     IMIPENEM <=0.25 SENSITIVE Sensitive     TRIMETH/SULFA <=20 SENSITIVE Sensitive     AMPICILLIN/SULBACTAM 16 INTERMEDIATE Intermediate     PIP/TAZO <=4 SENSITIVE Sensitive     * FEW KLEBSIELLA OXYTOCA  SARS CORONAVIRUS 2 (TAT 6-24 HRS) Nasopharyngeal Nasopharyngeal Swab     Status: None   Collection Time: 11/13/20  9:37 PM   Specimen: Nasopharyngeal Swab  Result Value Ref Range Status   SARS Coronavirus 2  NEGATIVE NEGATIVE Final    Comment: (NOTE) SARS-CoV-2 target nucleic acids are NOT DETECTED.  The SARS-CoV-2 RNA is generally detectable in upper and lower respiratory specimens during the acute phase of infection. Negative results do not preclude SARS-CoV-2 infection, do not rule out co-infections with other pathogens, and should not be used as the sole basis for treatment or other patient management decisions. Negative results must be combined with clinical observations, patient history, and epidemiological information. The expected result is Negative.  Fact Sheet for Patients: SugarRoll.be  Fact Sheet for Healthcare Providers: https://www.woods-mathews.com/  This test is not yet approved or cleared by the [place] FDA and  has been authorized for detection and/or diagnosis of SARS-CoV-2 by FDA under an Emergency Use Authorization (EUA). This EUA will remain  in effect (meaning this test can be used) for the duration of the COVID-19 declaration under Se ction 564(b)(1) of the Act, 21 U.S.C. section 360bbb-3(b)(1), unless the authorization is terminated or revoked sooner.  Performed at [facility], [place] [address], [place], [place] [address]   MRSA PCR Screening     Status: None   Collection Time: 11/14/20 11:16 PM   Specimen: Nasal Mucosa; Nasopharyngeal  Result Value Ref Range Status   MRSA by PCR NEGATIVE NEGATIVE Final    Comment:        The GeneXpert MRSA Assay (FDA approved for NASAL specimens only), is one component of a comprehensive MRSA colonization surveillance program. It is not intended to diagnose MRSA infection nor to guide or monitor treatment for MRSA infections. Performed at [facility], [place] [address], [place], [place] [address]   Aerobic/Anaerobic Culture w Gram Stain (surgical/deep wound)     Status: None   Collection Time: 11/15/20  9:20 AM   Specimen: PATH Other; Tissue  Result Value  Ref Range Status   Specimen Description TISSUE  Final   Special Requests LEFT FOOT SPEC A  Final   Gram Stain   Final    ABUNDANT WBC PRESENT, PREDOMINANTLY PMN FEW GRAM POSITIVE COCCI RARE GRAM NEGATIVE RODS    Culture   Final    ABUNDANT PSEUDOMONAS AERUGINOSA ABUNDANT STAPHYLOCOCCUS EPIDERMIDIS NO ANAEROBES ISOLATED Performed at [facility], [place] [address], [place],  [address]    Report Status 11/18/2020 FINAL  Final   Organism ID, Bacteria PSEUDOMONAS AERUGINOSA  Final   Organism ID, Bacteria STAPHYLOCOCCUS EPIDERMIDIS  Final      Susceptibility   Pseudomonas aeruginosa - MIC*    CEFTAZIDIME 4 SENSITIVE Sensitive     CIPROFLOXACIN 0.5 SENSITIVE Sensitive     GENTAMICIN <=1 SENSITIVE Sensitive     IMIPENEM 2 SENSITIVE Sensitive     * ABUNDANT PSEUDOMONAS AERUGINOSA   Staphylococcus epidermidis - MIC*    CIPROFLOXACIN <=0.5 SENSITIVE Sensitive     ERYTHROMYCIN >=8 RESISTANT Resistant     GENTAMICIN <=0.5 SENSITIVE Sensitive     OXACILLIN >=4 RESISTANT Resistant  TETRACYCLINE 2 SENSITIVE Sensitive     VANCOMYCIN 2 SENSITIVE Sensitive     TRIMETH/SULFA 160 RESISTANT Resistant     CLINDAMYCIN >=8 RESISTANT Resistant     RIFAMPIN <=0.5 SENSITIVE Sensitive     Inducible Clindamycin NEGATIVE Sensitive     * ABUNDANT STAPHYLOCOCCUS EPIDERMIDIS  C Difficile Quick Screen w PCR reflex     Status: None   Collection Time: 11/16/20  1:17 AM   Specimen: STOOL  Result Value Ref Range Status   C Diff antigen NEGATIVE NEGATIVE Final   C Diff toxin NEGATIVE NEGATIVE Final   C Diff interpretation No C. difficile detected.  Final    Comment: Performed at [facility], [place] [address] [place], [place] [address]    Studies/Results: [place] EKG SITE RITE  Result Date: 11/18/2020 If [facility] image not attached, placement could not be confirmed due to current cardiac rhythm.     Assessment/Plan:  INTERVAL HISTORY: PICC line placed.   Principal Problem:    Osteomyelitis ([place]) Active Problems:   Chronic hepatitis C without hepatic coma (HCC)   Hepatic cirrhosis (HCC)   Type 2 diabetes mellitus with hyperlipidemia ([place])   Essential hypertension   Cellulitis of left foot   Cellulitis of lower extremity   Cellulitis   Foot abscess, left    [name] [name] is a 60 y.o. male with diabetic foot infection with osteomyelitis status post transmetatarsal amputation with MRSA and Pseudomonas aeruginosa having been isolated.  We will continue vancomycin and cefepime.  Please see my note from yesterday and O PAT orders.  [name] [name] has an appointment on 11/28/2020 at [place] with Dr. Tommy Medal  He then has another appointment on April 29th, 2022 at 915 AM with Dr Tommy Medal   He should arrive 15 minutes prior to his appointments.  I will sign off for now please call if further questions.   LOS: 6 days   Alcide Evener 11/19/2020, 12:31 PM

## 2020-11-19 NOTE — Progress Notes (Signed)
Inpatient Rehab Admissions:  Inpatient Rehab Consult received.  I met with patient and his mother at the bedside for rehabilitation assessment and to discuss goals and expectations of an inpatient rehab admission.  Pt is already mobilizing very well (min guard with PT yesterday and per her report, no physical assist needed), so would likely only need 3-4 days, at best, on CIR.  Discharge plan would have to be home, which, per his mother is a halfway house (pt states it's a boarding house).  Barrier to discharge would still be pt's need for long term antibiotics and CIR would not resolve this issue.  Discussed case with rehab director Dr. Naaman Plummer, who agrees that PT rec for SNF is most appropriate.  Whitman Hero, RN CM aware, will sign off at this time.   Signed: Shann Medal, PT, DPT Admissions Coordinator [phone] 11/19/20  1:01 PM

## 2020-11-19 NOTE — Progress Notes (Signed)
Patient status post transmetatarsal amputation.  Patient is doing well.  Wound VAC is working well with 2 green checks 50 cc in the canister   Plan for discharge on antibiotics and follow-up in our office 1 week after discharge

## 2020-11-19 NOTE — Progress Notes (Incomplete)
Pharmacy Antibiotic Note  [name] [name] is a 60 y.o. male with Staph epi and pseudomonas LLE osteomyelitis. Pharmacy has been consulted for vancomycin and cefepime dosing.  MRI on 3/17 revealed osteomyelitis and patient had left transmetatarsal amputation 3/18. Plan to continue antibiotic therapy for 6 weeks per ID, end 4/29.  3/22 VT = 8 322 VP = ***  Vanc *** AUC   Plan: *** Vanco 1250 mg IV every 12 hours (eAUC 498, SCr 0.8) Cefepime 2 g IV every 8 hours F/u weekly vanc troughs, renal function  OPAT done 3/21, end date 12/27/20  Height: 5\' 11"  (180.3 cm) Weight: 74.5 kg (164 lb 3.9 oz) IBW/kg (Calculated) : 75.3  Temp (24hrs), Avg:98.1 F (36.7 C), Min:97.5 F (36.4 C), Max:98.5 F (36.9 C)  Recent Labs  Lab 11/13/20 1559 11/13/20 1738 11/14/20 0307 11/15/20 0416 11/16/20 0123 11/17/20 0131 11/19/20 1209  WBC 12.4*  --  13.6* 9.1 13.3* 9.4  --   CREATININE 0.75  --  0.57* 0.51* 0.58* 0.45*  --   LATICACIDVEN  --  2.0*  --   --   --   --   --   VANCOTROUGH  --   --   --   --   --   --  8*    Estimated Creatinine Clearance: 106.1 mL/min (A) (by C-G formula based on SCr of 0.45 mg/dL (L)).    Allergies  Allergen Reactions  . Bee Venom Anaphylaxis  . Lactose Intolerance (Gi) Other (See Comments)    GI upset    Antimicrobials this admission: 3/16 Zosyn x1 3/16 Vancomycin >>  3/17 Cefepime >>  3/17 Metronidazole >> 3/19  Microbiology results: 2/28 Deep surgical wound (foot): Klebsiella oxytoca- sensitive except ampicillin/unasyn 3/17 MRSA PCR: negative 3/18 L foot tissue: PSA (S-ceftaz, cipro), staph epi (S-cipro, vanc) 3/19 C.Diff: negative x2  Benetta Spar, PharmD, BCPS, BCCP Clinical Pharmacist  Please check AMION for all [place] phone numbers After 10:00 PM, call Jessie [phone]

## 2020-11-20 LAB — GLUCOSE, CAPILLARY
Glucose-Capillary: 199 mg/dL — ABNORMAL HIGH (ref 70–99)
Glucose-Capillary: 354 mg/dL — ABNORMAL HIGH (ref 70–99)
Glucose-Capillary: 230 mg/dL — ABNORMAL HIGH (ref 70–99)
Glucose-Capillary: 290 mg/dL — ABNORMAL HIGH (ref 70–99)

## 2020-11-20 MED ORDER — INSULIN GLARGINE 100 UNIT/ML ~~LOC~~ SOLN
48.0000 [IU] | Freq: Every day | SUBCUTANEOUS | Status: DC
  Administered 2020-11-20 – 2020-11-21 (×2): 48 [IU] via SUBCUTANEOUS
  Filled 2020-11-20 (×3): qty 0.48

## 2020-11-20 MED ORDER — INSULIN ASPART 100 UNIT/ML ~~LOC~~ SOLN
12.0000 [IU] | Freq: Three times a day (TID) | SUBCUTANEOUS | Status: DC
  Administered 2020-11-21 – 2020-11-24 (×10): 12 [IU] via SUBCUTANEOUS

## 2020-11-20 NOTE — NC FL2 (Signed)
[place] LEVEL OF CARE SCREENING TOOL     IDENTIFICATION  Patient Name: [name] [name] Birthdate: [birth year] Sex: male Admission Date (Current Location): 11/13/2020  [facility] and [place] Number:  Herbalist and Address:  [facility], [place] [address] [place], [place] [address]      Provider Number: [id number]  Attending Physician Name and Address:  Cherene Altes, MD  Relative Name and Phone Number:       Current Level of Care:   Recommended Level of Care: [place] Prior Approval Number:    Date Approved/Denied:   PASRR Number: pending  Discharge Plan: SNF    Current Diagnoses: Patient Active Problem List   Diagnosis Date Noted  . Osteomyelitis ([place]) 11/14/2020  . Foot abscess, left 11/14/2020  . Cellulitis of lower extremity 11/13/2020  . Cellulitis 11/13/2020  . Sepsis ([place]) 10/28/2020  . Cellulitis of left foot 10/28/2020  . Community acquired pneumonia of right lower lobe of lung   . Elevated LFTs   . Hypophosphatemia   . Acute metabolic encephalopathy   . Acute maxillary sinusitis   . Depression   . Physical deconditioning   . Pneumonia 05/14/2020  . DTs (delirium tremens) ([place]) 05/13/2020  . Hyponatremia 05/13/2020  . Hypokalemia 05/13/2020  . PNA (pneumonia) 05/13/2020  . SIRS (systemic inflammatory response syndrome) ([place]) 05/13/2020  . Sepsis with acute hypoxic respiratory failure without septic shock ([place])   . Alcohol abuse with alcohol-induced mood disorder ([place]) 12/17/2017  . Hereditary hemochromatosis ([place]) 11/23/2017  . 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 Essential hypertension 04/02/2017  . Hypogonadism male 08/13/2016  . ETOH abuse 06/22/2016  . Low serum testosterone level 01/08/2016  . Type 2 diabetes mellitus with hyperlipidemia ([place]) 01/02/2016  . Erectile dysfunction 09/12/2015  . Hepatic cirrhosis ([place]) 01/10/2015  . Chronic hepatitis C without  hepatic coma ([place]) 10/31/2014  . Tobacco use disorder 08/03/2013    Orientation RESPIRATION BLADDER Height & Weight     Time,Self,Situation,Place  Normal Continent Weight: 164 lb 3.9 oz (74.5 kg) Height:  5\' 11"  (180.3 cm)  BEHAVIORAL SYMPTOMS/MOOD NEUROLOGICAL BOWEL NUTRITION STATUS      Continent Diet (See discharge summary)  AMBULATORY STATUS COMMUNICATION OF NEEDS Skin   Limited Assist Verbally Normal,Skin abrasions,Other (Comment) (diabetic wounds)                       Personal Care Assistance Level of Assistance  Bathing,Feeding,Dressing Bathing Assistance: Limited assistance Feeding assistance: Independent Dressing Assistance: Limited assistance     Functional Limitations Info  Sight,Hearing,Speech Sight Info: Adequate Hearing Info: Adequate Speech Info: Adequate    SPECIAL CARE FACTORS FREQUENCY  PT (By licensed PT),OT (By licensed OT)     PT Frequency: 5x a week OT Frequency: 5x a week            Contractures Contractures Info: Not present    Additional Factors Info  Code Status,Allergies,Insulin Sliding Scale Code Status Info: DNR Allergies Info: Bee Venom   Lactose Intolerance (Gi)   Insulin Sliding Scale Info: Novolog 0-20 units 3x a day, Lantus 40 units at bedtime       Current Medications (11/20/2020):  This is the current hospital active medication list Current Facility-Administered Medications  Medication Dose Route Frequency Provider Last Rate Last Admin  . 0.9 %  sodium chloride infusion   Intravenous Continuous Persons, Bevely Palmer, [place]   Stopped at 11/16/20 0134  .  0.9 %  sodium chloride infusion   Intravenous Continuous Kathie Dike, MD 10 mL/hr at 11/17/20 2200 New Bag at 11/17/20 2200  . acetaminophen (TYLENOL) tablet 650 mg  650 mg Oral Q6H PRN Mariel Aloe, MD   650 mg at 11/14/20 1640   Or  . acetaminophen (TYLENOL) suppository 650 mg  650 mg Rectal Q6H PRN Mariel Aloe, MD      . albuterol (VENTOLIN HFA) 108 (90 Base)  MCG/ACT inhaler 2 puff  2 puff Inhalation Q6H PRN Mariel Aloe, MD      . aspirin EC tablet 81 mg  81 mg Oral Daily Mariel Aloe, MD   81 mg at 11/20/20 1034  . atorvastatin (LIPITOR) tablet 40 mg  40 mg Oral Daily Mariel Aloe, MD   40 mg at 11/20/20 1034  . ceFEPIme (MAXIPIME) 2 g in sodium chloride 0.9 % 100 mL IVPB  2 g Intravenous Q8H Mariel Aloe, MD 200 mL/hr at 11/20/20 1039 2 g at 11/20/20 1039  . Chlorhexidine Gluconate Cloth 2 % PADS 6 each  6 each Topical Daily Kathie Dike, MD   6 each at 11/20/20 1043  . DULoxetine (CYMBALTA) DR capsule 60 mg  60 mg Oral Daily Mariel Aloe, MD   60 mg at 11/20/20 1034  . folic acid (FOLVITE) tablet 1 mg  1 mg Oral Daily Mariel Aloe, MD   1 mg at 11/20/20 1034  . heparin injection 5,000 Units  5,000 Units Subcutaneous Q8H Mariel Aloe, MD   5,000 Units at 11/20/20 1441  . HYDROmorphone (DILAUDID) injection 0.5 mg  0.5 mg Intravenous Q4H PRN Persons, Bevely Palmer, PA      . insulin aspart (novoLOG) injection 0-20 Units  0-20 Units Subcutaneous TID WC Kathie Dike, MD   20 Units at 11/20/20 1158  . insulin aspart (novoLOG) injection 0-5 Units  0-5 Units Subcutaneous QHS Kathie Dike, MD   4 Units at 11/19/20 2151  . insulin aspart (novoLOG) injection 9 Units  9 Units Subcutaneous TID WC Kathie Dike, MD   9 Units at 11/20/20 1158  . insulin glargine (LANTUS) injection 40 Units  40 Units Subcutaneous QHS Kathie Dike, MD   40 Units at 11/19/20 2150  . lactated ringers infusion   Intravenous Continuous Lidia Collum, MD 10 mL/hr at 11/15/20 0900 Restarted at 11/15/20 0931  . loperamide (IMODIUM) capsule 2 mg  2 mg Oral TID PRN Kathie Dike, MD   2 mg at 11/18/20 1318  . losartan (COZAAR) tablet 50 mg  50 mg Oral Daily Mariel Aloe, MD   50 mg at 11/20/20 1034  . metoCLOPramide (REGLAN) tablet 5-10 mg  5-10 mg Oral Q8H PRN Persons, Bevely Palmer, PA       Or  . metoCLOPramide (REGLAN) injection 5-10 mg  5-10 mg  Intravenous Q8H PRN Persons, Bevely Palmer, [place]      . mirtazapine (REMERON) tablet 15 mg  15 mg Oral QHS Mariel Aloe, MD   15 mg at 11/19/20 2149  . multivitamin with minerals tablet 1 tablet  1 tablet Oral Daily Mariel Aloe, MD   1 tablet at 11/20/20 1034  . nicotine (NICODERM CQ - dosed in mg/24 hr) patch 7 mg  7 mg Transdermal Daily Mariel Aloe, MD   7 mg at 11/20/20 1035  . ondansetron (ZOFRAN) tablet 4 mg  4 mg Oral Q6H PRN Persons, Bevely Palmer, [place]  Or  . ondansetron (ZOFRAN) injection 4 mg  4 mg Intravenous Q6H PRN Persons, Bevely Palmer, PA      . oxyCODONE (Oxy IR/ROXICODONE) immediate release tablet 5-10 mg  5-10 mg Oral Q4H PRN Persons, Bevely Palmer, PA   10 mg at 11/20/20 1447  . pantoprazole (PROTONIX) EC tablet 40 mg  40 mg Oral Daily Kathie Dike, MD   40 mg at 11/20/20 1034  . saccharomyces boulardii (FLORASTOR) capsule 250 mg  250 mg Oral BID Kathie Dike, MD   250 mg at 11/20/20 1034  . sodium chloride flush (NS) 0.9 % injection 10-40 mL  10-40 mL Intracatheter PRN Kathie Dike, MD      . vancomycin (VANCOREADY) IVPB 1500 mg/300 mL  1,500 mg Intravenous Q12H Kathie Dike, MD 150 mL/hr at 11/20/20 1158 1,500 mg at 11/20/20 1158     Discharge Medications: Please see discharge summary for a list of discharge medications.  Relevant Imaging Results:  Relevant Lab Results:   Additional Information [id number]  Emeterio Reeve, [place]

## 2020-11-20 NOTE — TOC Progression Note (Addendum)
Transition of Care [facility]) - Progression Note    Patient Details  Name: WAEL MAESTAS MRN: [id number] Date of Birth: [birth year]  Transition of Care [facility]) CM/SW [place], [place] Phone Number: 11/20/2020, 3:01 PM  Clinical Narrative:    CSW spoke with pt by phone concerning possible SNF placement and plans after rehab.  Pt lives in a [place].  Pt is a recovering alcoholic according to Mr. Pearline Cables. and rents a room for $120.00 a week.  Pt rents a room from Clearnce Sorrel [phone].  CSW left a vm for Mr. Pearline Cables to return phone call.  Pt also stated he has a Eligibility Case Worker, [place], [phone]. TOC will continue to assist with disposition planning.  Update CSW spoke with Clearnce Sorrel.  Pt has lived at the [facility] for 3 months.  Mr. Pearline Cables stated " pt can return to Recovery House as long as his rent is paid each week. Payment can be made by check, cash, money order or payment can be made through patients mother.  Expected Discharge Plan: [place] Barriers to Discharge: Continued Medical Work up  Expected Discharge Plan and Services Expected Discharge Plan: [place] In-house Referral: Clinical Social Work     Living arrangements for the past 2 months: [place] Determinants of Health (SDOH) Interventions    Readmission Risk Interventions Readmission Risk Prevention Plan 11/14/2020  Transportation Screening Complete  HRI or [place] Complete  Social Work Consult for [place] Planning/Counseling Complete  Palliative Care Screening Not Applicable  Medication Review Press photographer) Complete  Some recent data might be hidden

## 2020-11-20 NOTE — TOC Progression Note (Addendum)
Transition of Care [facility]) - Progression Note    Patient Details  Name: [name] [name] MRN: [id number] Date of Birth: 1961-08-27  Transition [facility] Care [facility]) CM/SW Contact  Sharin Mons, RN Phone Number: [phone] 11/20/2020, 2:26 PM  Clinical Narrative:    NCM made aware by [place] granted for [facility] SNF for LT IVABX only if pt can return to boarding house once ABX therapy completes.  NCM called Clearnce Sorrel ( owner of recovery house) @ [phone]. Call unsuccessful. Voice message left...awaitng call back.  TOC team will continue to monitor ....   11/20/2020 @ 1600  NCM received call from Clearnce Sorrel ( owner of recovery house). Dominica Severin stated pt is welcome to return, however, rent of $ 720 needs to be paid in order hold room. NCM spoke with pt , pt to speak with mom and Clearnce Sorrel. NCM to f/u in am   Expected Discharge Plan: [place] Barriers to Discharge: Continued Medical Work up  Expected Discharge Plan and Services Expected Discharge Plan: [place] In-house Referral: Clinical Social Work     Living arrangements for the past 2 months: [place] Determinants of Health (SDOH) Interventions    Readmission Risk Interventions Readmission Risk Prevention Plan 11/14/2020  Transportation Screening Complete  HRI or [place] Complete  Social Work Consult for [place] Planning/Counseling Complete  Palliative Care Screening Not Applicable  Medication Review Press photographer) Complete  Some recent data might be hidden

## 2020-11-20 NOTE — Plan of Care (Signed)

## 2020-11-20 NOTE — Progress Notes (Signed)
[name] [name]  LEX:[id number] DOB: 1960-11-22 DOA: 11/13/2020 PCP: [name], NP    Brief Narrative:  60 year old with a history of DM 2, HTN, chronic hepatitis C, and GERD who presented to the ER with a worsening left foot wound.  Following his admission empiric vancomycin and cefepime were initiated.  MRI confirmed osteomyelitis and abscess of the affected foot.  Orthopedic surgery was consulted and took the patient to the OR for a transmetatarsal amputation 3/18.  Antimicrobials:  Cefepime 3/17 > Vancomycin 3/17 > Flagyl 3/17 >319  DVT prophylaxis: Subcu heparin  Consultants:  none  Subjective: Resting comfortably in bed.  Denies chest pain shortness breath fevers or chills.  Reports pain in leg is well controlled.  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  HTN Blood pressure currently reasonably controlled  History of alcohol abuse and cirrhosis of the liver Denies current alcohol use -MRI abdomen 2018 suggested early cirrhosis  Chronic anemia Follow-up hemoglobin in a.m.  HLD Continue Lipitor  Depression Continue usual Cymbalta and Remeron  GERD Continue PPI  Difficult disposition Patient does not have a suitable environment in which to be discharged to allow for proper wound care/wound VAC use -hopeful for SNF placement   Code Status: NO CODE BLUE Family Communication:  Status  is: Inpatient  Remains inpatient appropriate because:Unsafe d/c plan   Dispo: The patient is from: Home              Anticipated d/c is to: SNF              Patient currently is medically stable to d/c.   Difficult to place patient No    Objective: Blood pressure (!) 141/94, pulse 63, temperature 97.9 F (36.6 C), temperature source Oral, resp. rate 17, height 5\' 11"  (1.803 m), weight 74.5 kg, SpO2 97 %.  Intake/Output Summary (Last 24 hours) at 11/20/2020 1038 Last data filed at 11/20/2020 0300 Gross per 24 hour  Intake -  Output 1300 ml  Net -1300 ml   Filed Weights   11/13/20 1546 11/14/20 0057 11/15/20 0842  Weight: 74.4 kg 74.5 kg 74.5 kg    Examination: General: No acute respiratory distress Lungs: Clear to auscultation bilaterally without wheezes or crackles Cardiovascular: Regular rate and rhythm without murmur gallop or rub normal S1 and S2 Abdomen: Nontender, nondistended, soft, bowel sounds positive, no rebound, no ascites, no appreciable mass Extremities: No significant cyanosis, clubbing, or edema bilateral lower extremities  CBC: Recent Labs  Lab 11/13/20 1559 11/14/20 0307 11/15/20 0416 11/16/20 0123 11/17/20 0131  WBC 12.4* 13.6* 9.1 13.3* 9.4  NEUTROABS 8.4* 8.0*  --   --   --   HGB 11.8* 11.5* 13.5 12.4* 12.7*  HCT 36.3* 35.4* 40.0 37.4* 38.5*  MCV 91.7 91.7 88.1 89.7 89.3  PLT 370 336 381 357 449   Basic Metabolic Panel: Recent Labs  Lab 11/15/20 0416 11/16/20 0123 11/17/20 0131  NA 131* 131* 133*  K 4.2 3.8 4.1  CL 102 103  101  CO2 21* 25 27  GLUCOSE 307* 268* 229*  BUN 8 9 8   CREATININE 0.51* 0.58* 0.45*  CALCIUM 8.8* 8.2* 8.5*   GFR: Estimated Creatinine Clearance: 106.1 mL/min (A) (by C-G formula based on SCr of 0.45 mg/dL (L)).  Liver Function Tests: Recent Labs  Lab 11/13/20 1559 11/14/20 0307 11/15/20 0416  AST 27 21 26   ALT 21 16 22   ALKPHOS 77 66 71  BILITOT 0.6 0.5 0.8  PROT 7.5 6.6 7.3  ALBUMIN 3.4* 2.8* 2.8*     Coagulation Profile: Recent Labs  Lab 11/15/20 0416  INR 1.0    HbA1C: Hgb A1c MFr Bld  Date/Time Value Ref Range Status  10/28/2020 10:14 AM 11.2 (H) 4.8 - 5.6 % Final    Comment:    (NOTE) Pre diabetes:          5.7%-6.4%  Diabetes:              >6.4%  Glycemic control for   <7.0% adults with diabetes   09/23/2020 10:37 AM 12.1 (H) 4.8 - 5.6 % Final    Comment:             Prediabetes: 5.7 - 6.4          Diabetes: >6.4          Glycemic control for adults with diabetes: <7.0     CBG: Recent Labs  Lab 11/19/20 0604 11/19/20 1159 11/19/20 1617 11/19/20 2139 11/20/20 0737  GLUCAP 237* 292* 179* 312* 199*    Recent Results (from the past 240 hour(s))  SARS CORONAVIRUS 2 (TAT 6-24 HRS) Nasopharyngeal Nasopharyngeal Swab     Status: None   Collection Time: 11/13/20  9:37 PM   Specimen: Nasopharyngeal Swab  Result Value Ref Range Status   SARS Coronavirus 2 NEGATIVE NEGATIVE Final    Comment: (NOTE) SARS-CoV-2 target nucleic acids are NOT DETECTED.  The SARS-CoV-2 RNA is generally detectable in upper and lower respiratory specimens during the acute phase of infection. Negative results do not preclude SARS-CoV-2 infection, do not rule out co-infections with other pathogens, and should not be used as the sole basis for treatment or other patient management decisions. Negative results must be combined with clinical observations, patient history, and epidemiological information. The expected result is Negative.  Fact Sheet for Patients: SugarRoll.be  Fact Sheet for Healthcare Providers: https://www.woods-mathews.com/  This test is not yet approved or cleared by the [place] FDA and  has been authorized for detection and/or diagnosis of SARS-CoV-2 by FDA under an Emergency Use Authorization (EUA). This EUA will remain  in effect (meaning this test can be used) for the duration of the COVID-19 declaration under  Se ction 564(b)(1) of the Act, 21 U.S.C. section 360bbb-3(b)(1), unless the authorization is terminated or revoked sooner.  Performed at [facility], [place] [address] [place], [place] [address]   MRSA PCR Screening     Status: None   Collection Time: 11/14/20 11:16 PM   Specimen: Nasal Mucosa; Nasopharyngeal  Result Value Ref Range Status   MRSA by PCR NEGATIVE NEGATIVE Final    Comment:        The GeneXpert MRSA Assay (FDA approved for NASAL specimens only), is one component of a comprehensive MRSA colonization surveillance program. It is not intended to diagnose MRSA infection nor to guide or monitor treatment for MRSA infections. Performed at [facility], [place] [address] [place], [place] [address]   Aerobic/Anaerobic Culture w Gram Stain (surgical/deep  wound)     Status: None   Collection Time: 11/15/20  9:20 AM   Specimen: PATH Other; Tissue  Result Value Ref Range Status   Specimen Description TISSUE  Final   Special Requests LEFT FOOT SPEC A  Final   Gram Stain   Final    ABUNDANT WBC PRESENT, PREDOMINANTLY PMN FEW GRAM POSITIVE COCCI RARE GRAM NEGATIVE RODS    Culture   Final    ABUNDANT PSEUDOMONAS AERUGINOSA ABUNDANT STAPHYLOCOCCUS EPIDERMIDIS NO ANAEROBES ISOLATED Performed at [facility], [address], [place], [place] [address]    Report Status 11/18/2020 FINAL  Final   Organism ID, Bacteria PSEUDOMONAS AERUGINOSA  Final   Organism ID, Bacteria STAPHYLOCOCCUS EPIDERMIDIS  Final      Susceptibility   Pseudomonas aeruginosa - MIC*    CEFTAZIDIME 4 SENSITIVE Sensitive     CIPROFLOXACIN 0.5 SENSITIVE Sensitive     GENTAMICIN <=1 SENSITIVE Sensitive     IMIPENEM 2 SENSITIVE Sensitive     * ABUNDANT PSEUDOMONAS AERUGINOSA   Staphylococcus epidermidis - MIC*    CIPROFLOXACIN <=0.5 SENSITIVE Sensitive     ERYTHROMYCIN >=8 RESISTANT Resistant     GENTAMICIN <=0.5 SENSITIVE Sensitive     OXACILLIN >=4 RESISTANT Resistant     TETRACYCLINE  2 SENSITIVE Sensitive     VANCOMYCIN 2 SENSITIVE Sensitive     TRIMETH/SULFA 160 RESISTANT Resistant     CLINDAMYCIN >=8 RESISTANT Resistant     RIFAMPIN <=0.5 SENSITIVE Sensitive     Inducible Clindamycin NEGATIVE Sensitive     * ABUNDANT STAPHYLOCOCCUS EPIDERMIDIS  C Difficile Quick Screen w PCR reflex     Status: None   Collection Time: 11/16/20  1:17 AM   Specimen: STOOL  Result Value Ref Range Status   C Diff antigen NEGATIVE NEGATIVE Final   C Diff toxin NEGATIVE NEGATIVE Final   C Diff interpretation No C. difficile detected.  Final    Comment: Performed at [facility], [place] [address], [place], [place] [address]     Scheduled Meds: . aspirin EC  81 mg Oral Daily  . atorvastatin  40 mg Oral Daily  . Chlorhexidine Gluconate Cloth  6 each Topical Daily  . DULoxetine  60 mg Oral Daily  . folic acid  1 mg Oral Daily  . heparin  5,000 Units Subcutaneous Q8H  . insulin aspart  0-20 Units Subcutaneous TID WC  . insulin aspart  0-5 Units Subcutaneous QHS  . insulin aspart  9 Units Subcutaneous TID WC  . insulin glargine  40 Units Subcutaneous QHS  . losartan  50 mg Oral Daily  . mirtazapine  15 mg Oral QHS  . multivitamin with minerals  1 tablet Oral Daily  . nicotine  7 mg Transdermal Daily  . pantoprazole  40 mg Oral Daily  . saccharomyces boulardii  250 mg Oral BID   Continuous Infusions: . sodium chloride Stopped (11/16/20 0134)  . sodium chloride 10 mL/hr at 11/17/20 2200  . ceFEPime (MAXIPIME) IV 2 g (11/20/20 0253)  . lactated ringers 10 mL/hr at 11/15/20 0900  . vancomycin 1,500 mg (11/20/20 0050)     LOS: 7 days   Cherene Altes, MD [facility] Office  [phone] Pager - Text Page per Amion  If 7PM-7AM, please contact night-coverage per Amion 11/20/2020, 10:38 AM

## 2020-11-20 NOTE — Social Work (Signed)
[name] [name] 1961-08-12  Please be advised that the above-named patient will require a short-term nursing home stay - anticipated 30 days or less for rehabilitation and strengthening.  The plan is for return home.

## 2020-11-20 NOTE — Progress Notes (Signed)
PHARMACY CONSULT NOTE FOR:  OUTPATIENT  PARENTERAL ANTIBIOTIC THERAPY (OPAT)  Indication: osteomyelitis  Regimen: cefepime 2g IV q8h AND vancomycin 1500mg  IV q12h End date: 12/27/2020  IV antibiotic discharge orders are pended. To discharging provider:  please sign these orders via discharge navigator,  Select New Orders & click on the button choice - Manage This Unsigned Work.     Thank you for allowing pharmacy to be a part of this patient's care.  Benetta Spar, PharmD, BCPS, BCCP Clinical Pharmacist  Please check AMION for all [place] phone numbers After 10:00 PM, call [place] [phone]

## 2020-11-21 LAB — GLUCOSE, CAPILLARY
Glucose-Capillary: 189 mg/dL — ABNORMAL HIGH (ref 70–99)
Glucose-Capillary: 352 mg/dL — ABNORMAL HIGH (ref 70–99)
Glucose-Capillary: 352 mg/dL — ABNORMAL HIGH (ref 70–99)
Glucose-Capillary: 278 mg/dL — ABNORMAL HIGH (ref 70–99)
Glucose-Capillary: 266 mg/dL — ABNORMAL HIGH (ref 70–99)
Glucose-Capillary: 288 mg/dL — ABNORMAL HIGH (ref 70–99)

## 2020-11-21 LAB — BASIC METABOLIC PANEL
Anion gap: 4 — ABNORMAL LOW (ref 5–15)
BUN: 7 mg/dL (ref 6–20)
CO2: 28 mmol/L (ref 22–32)
Calcium: 8.9 mg/dL (ref 8.9–10.3)
Chloride: 101 mmol/L (ref 98–111)
Creatinine, Ser: 0.47 mg/dL — ABNORMAL LOW (ref 0.61–1.24)
GFR, Estimated: >60 mL/min (ref >60)
Glucose, Bld: 204 mg/dL — ABNORMAL HIGH (ref 70–99)
Potassium: 3.9 mmol/L (ref 3.5–5.1)
Sodium: 133 mmol/L — ABNORMAL LOW (ref 135–145)

## 2020-11-21 LAB — CBC
HCT: 37.7 % — ABNORMAL LOW (ref 39.0–52.0)
Hemoglobin: 12.3 g/dL — ABNORMAL LOW (ref 13.0–17.0)
MCH: 29.6 pg (ref 26.0–34.0)
MCHC: 32.6 g/dL (ref 30.0–36.0)
MCV: 90.8 fL (ref 80.0–100.0)
Platelets: 306 10*3/uL (ref 150–400)
RBC: 4.15 MIL/uL — ABNORMAL LOW (ref 4.22–5.81)
RDW: 12.8 % (ref 11.5–15.5)
WBC: 10.5 10*3/uL (ref 4.0–10.5)
nRBC: 0 % (ref 0.0–0.2)

## 2020-11-21 LAB — MAGNESIUM: Magnesium: 1.9 mg/dL (ref 1.7–2.4)

## 2020-11-21 NOTE — Progress Notes (Signed)
Patient is 7 days status post transmetatarsal amputation.  Wound VAC was removed today.  He has some skin maceration as would be expected but well apposed wound edges.  Stitches in place.  Some bloody drainage.  Overall swelling is well controlled.  No areas of dehiscence.  No ascending cellulitis.  Patient will need 1 week follow-up in our office

## 2020-11-21 NOTE — Progress Notes (Signed)
[name] [name]  RJJ:[id number] DOB: 1961/05/28 DOA: 11/13/2020 PCP: [name], NP    Brief Narrative:  [phone] with a history of DM2, HTN, chronic hepatitis C, and GERD who presented to the ER with a worsening left foot wound.  Following his admission empiric vancomycin and cefepime were initiated.  MRI confirmed osteomyelitis and abscess of the affected foot.  Orthopedic surgery was consulted and took the patient to the OR for a transmetatarsal amputation 3/18.  Antimicrobials:  Cefepime 3/17 > Vancomycin 3/17 > Flagyl 3/17 >319  DVT prophylaxis: Subcu heparin  Consultants:  none  Subjective: Wound VAC removed by the orthopedic service today.  Wound stable per orthopedic eval. resting comfortably in bed.  No new complaints.  States he has a good appetite and intake.  Pain is well controlled.   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 -empirically being treated with vancomycin and cefepime -intraoperative culture positive for Pseudomonas and staph epi -ID consulted and directing antibiotic therapy -PICC line placed 3/22 with plan for 6 weeks of antibiotics -to follow-up with ID in the outpatient setting -wound improving per orthopedic service with plan to follow-up in the office in 1 week  DM2 On Invokana and glipizide as outpatient -A1c 11.22/22 -Lantus started as inpatient -CBGs not yet at goal - adjust insulin tx again today    Hyponatremia Some element of pseudohyponatremia related to hyperglycemia, and also likely related to cirrhosis - follow trend with increased intake and improved CBG control  HTN Blood pressure currently reasonably controlled  History of alcohol abuse and cirrhosis of the liver Denies current alcohol use -MRI abdomen 2018 suggested early cirrhosis  Chronic anemia Follow-up hemoglobin in a.m.  HLD Continue Lipitor  Depression Continue usual  Cymbalta and Remeron  GERD Continue PPI  Difficult disposition Patient does not have a suitable environment in which to be discharged to allow for proper wound care/wound VAC use -hopeful for SNF placement   Code Status: NO CODE BLUE Family Communication:  Status is: Inpatient  Remains inpatient appropriate because:Unsafe d/c plan   Dispo: The patient is from: Home              Anticipated d/c is to: SNF              Patient currently is medically stable to d/c.   Difficult to place patient No    Objective: Blood pressure 125/75, pulse 68, temperature 98.4 F (36.9 C), temperature source Oral, resp. rate 18, height 5\' 11"  (1.803 m), weight 74.5 kg, SpO2 99 %.  Intake/Output Summary (Last 24 hours) at 11/21/2020 1012 Last data filed at 11/21/2020 1093 Gross per 24 hour  Intake -  Output 1803 ml  Net -1803 ml   Filed Weights   11/13/20 1546 11/14/20 0057 11/15/20 0842  Weight: 74.4 kg 74.5 kg 74.5 kg    Examination: General: No acute respiratory distress Lungs: CTA B without wheezing Cardiovascular: RRR without murmur or rub Abdomen: NT/ND, soft, BS positive, no rebound Extremities: Wound dressed and dry, no lower extremity edema  CBC: Recent Labs  Lab 11/16/20 0123 11/17/20 0131 11/21/20 0345  WBC 13.3* 9.4 10.5  HGB 12.4* 12.7* 12.3*  HCT 37.4* 38.5* 37.7*  MCV 89.7 89.3 90.8  PLT 357 360 235   Basic Metabolic Panel: Recent Labs  Lab 11/16/20 0123 11/17/20 0131 11/21/20 0345  NA 131* 133* 133*  K 3.8 4.1 3.9  CL 103  101 101  CO2 25 27 28   GLUCOSE 268* 229* 204*  BUN 9 8 7   CREATININE 0.58* 0.45* 0.47*  CALCIUM 8.2* 8.5* 8.9  MG  --   --  1.9   GFR: Estimated Creatinine Clearance: 106.1 mL/min (A) (by C-G formula based on SCr of 0.47 mg/dL (L)).  Liver Function Tests: Recent Labs  Lab 11/15/20 0416  AST 26  ALT 22  ALKPHOS 71  BILITOT 0.8  PROT 7.3  ALBUMIN 2.8*    Coagulation Profile: Recent Labs  Lab 11/15/20 0416  INR 1.0     HbA1C: Hgb A1c MFr Bld  Date/Time Value Ref Range Status  10/28/2020 10:14 AM 11.2 (H) 4.8 - 5.6 % Final    Comment:    (NOTE) Pre diabetes:          5.7%-6.4%  Diabetes:              >6.4%  Glycemic control for   <7.0% adults with diabetes   09/23/2020 10:37 AM 12.1 (H) 4.8 - 5.6 % Final    Comment:             Prediabetes: 5.7 - 6.4          Diabetes: >6.4          Glycemic control for adults with diabetes: <7.0     CBG: Recent Labs  Lab 11/20/20 1125 11/20/20 1553 11/20/20 2100 11/21/20 0626 11/21/20 0817  GLUCAP 354* 230* 290* 189* 352*  352*    Recent Results (from the past 240 hour(s))  SARS CORONAVIRUS 2 (TAT 6-24 HRS) Nasopharyngeal Nasopharyngeal Swab     Status: None   Collection Time: 11/13/20  9:37 PM   Specimen: Nasopharyngeal Swab  Result Value Ref Range Status   SARS Coronavirus 2 NEGATIVE NEGATIVE Final    Comment: (NOTE) SARS-CoV-2 target nucleic acids are NOT DETECTED.  The SARS-CoV-2 RNA is generally detectable in upper and lower respiratory specimens during the acute phase of infection. Negative results do not preclude SARS-CoV-2 infection, do not rule out co-infections with other pathogens, and should not be used as the sole basis for treatment or other patient management decisions. Negative results must be combined with clinical observations, patient history, and epidemiological information. The expected result is Negative.  Fact Sheet for Patients: SugarRoll.be  Fact Sheet for Healthcare Providers: https://www.woods-mathews.com/  This test is not yet approved or cleared by the [place] FDA and  has been authorized for detection and/or diagnosis of SARS-CoV-2 by FDA under an Emergency Use Authorization (EUA). This EUA will remain  in effect (meaning this test can be used) for the duration of the COVID-19 declaration under Se ction 564(b)(1) of the Act, 21 U.S.C. section  360bbb-3(b)(1), unless the authorization is terminated or revoked sooner.  Performed at [facility], [place] [address], [place], [place] [address]   MRSA PCR Screening     Status: None   Collection Time: 11/14/20 11:16 PM   Specimen: Nasal Mucosa; Nasopharyngeal  Result Value Ref Range Status   MRSA by PCR NEGATIVE NEGATIVE Final    Comment:        The GeneXpert MRSA Assay (FDA approved for NASAL specimens only), is one component of a comprehensive MRSA colonization surveillance program. It is not intended to diagnose MRSA infection nor to guide or monitor treatment for MRSA infections. Performed at [facility], [place] [address], [place], [place] [address]   Aerobic/Anaerobic Culture w Gram Stain (surgical/deep wound)  Status: None   Collection Time: 11/15/20  9:20 AM   Specimen: PATH Other; Tissue  Result Value Ref Range Status   Specimen Description TISSUE  Final   Special Requests LEFT FOOT SPEC A  Final   Gram Stain   Final    ABUNDANT WBC PRESENT, PREDOMINANTLY PMN FEW GRAM POSITIVE COCCI RARE GRAM NEGATIVE RODS    Culture   Final    ABUNDANT PSEUDOMONAS AERUGINOSA ABUNDANT STAPHYLOCOCCUS EPIDERMIDIS NO ANAEROBES ISOLATED Performed at [facility], [address], [place], [place] [address]    Report Status 11/18/2020 FINAL  Final   Organism ID, Bacteria PSEUDOMONAS AERUGINOSA  Final   Organism ID, Bacteria STAPHYLOCOCCUS EPIDERMIDIS  Final      Susceptibility   Pseudomonas aeruginosa - MIC*    CEFTAZIDIME 4 SENSITIVE Sensitive     CIPROFLOXACIN 0.5 SENSITIVE Sensitive     GENTAMICIN <=1 SENSITIVE Sensitive     IMIPENEM 2 SENSITIVE Sensitive     * ABUNDANT PSEUDOMONAS AERUGINOSA   Staphylococcus epidermidis - MIC*    CIPROFLOXACIN <=0.5 SENSITIVE Sensitive     ERYTHROMYCIN >=8 RESISTANT Resistant     GENTAMICIN <=0.5 SENSITIVE Sensitive     OXACILLIN >=4 RESISTANT Resistant     TETRACYCLINE 2 SENSITIVE Sensitive     VANCOMYCIN 2 SENSITIVE  Sensitive     TRIMETH/SULFA 160 RESISTANT Resistant     CLINDAMYCIN >=8 RESISTANT Resistant     RIFAMPIN <=0.5 SENSITIVE Sensitive     Inducible Clindamycin NEGATIVE Sensitive     * ABUNDANT STAPHYLOCOCCUS EPIDERMIDIS  C Difficile Quick Screen w PCR reflex     Status: None   Collection Time: 11/16/20  1:17 AM   Specimen: STOOL  Result Value Ref Range Status   C Diff antigen NEGATIVE NEGATIVE Final   C Diff toxin NEGATIVE NEGATIVE Final   C Diff interpretation No C. difficile detected.  Final    Comment: Performed at [facility], [place] [address], [place], [place] [address]     Scheduled Meds: . aspirin EC  81 mg Oral Daily  . atorvastatin  40 mg Oral Daily  . Chlorhexidine Gluconate Cloth  6 each Topical Daily  . DULoxetine  60 mg Oral Daily  . folic acid  1 mg Oral Daily  . heparin  5,000 Units Subcutaneous Q8H  . insulin aspart  0-20 Units Subcutaneous TID WC  . insulin aspart  0-5 Units Subcutaneous QHS  . insulin aspart  12 Units Subcutaneous TID WC  . insulin glargine  48 Units Subcutaneous QHS  . losartan  50 mg Oral Daily  . mirtazapine  15 mg Oral QHS  . multivitamin with minerals  1 tablet Oral Daily  . nicotine  7 mg Transdermal Daily  . pantoprazole  40 mg Oral Daily  . saccharomyces boulardii  250 mg Oral BID   Continuous Infusions: . sodium chloride 10 mL/hr at 11/17/20 2200  . ceFEPime (MAXIPIME) IV 2 g (11/21/20 0824)  . vancomycin 1,500 mg (11/20/20 2309)     LOS: 8 days   Cherene Altes, MD [facility] Office  [phone] Pager - Text Page per Amion  If 7PM-7AM, please contact night-coverage per Amion 11/21/2020, 10:12 AM

## 2020-11-21 NOTE — Progress Notes (Signed)
Physical Therapy Treatment Patient Details Name: [name] [name] MRN: [id number] DOB: [birth year] Today's Date: 11/21/2020    History of Present Illness Patient is a 60 y/o male presented to ED from wound care center for concerns of cellulitis of L great toe with ulceration and uncontrolled diabetes. Patient now s/p L transmetatarsal amputation on 3/18. PMH: depression, uncontrolled DM, GERD, hep C, neuropathy    PT Comments    Pt making good progress and demonstrates safe mobility with RW.  He is planning to go to SNF for IV antibiotics prior to returning home.  Considered crutch training but prefers to use RW since going to SNF and then reports has been told he could weight bear after 3 more weeks (instructed to clarify with MD before beginning wbing). Continue to progress as able.    Follow Up Recommendations  SNF;Supervision - Intermittent (pt could go home from functional stand point if pt's housing allows or can help with managing patients IV antibiotics)     Equipment Recommendations  Rolling walker with 5" wheels;3in1 (PT);Wheelchair (measurements PT);Wheelchair cushion (measurements PT)    Recommendations for Other Services       Precautions / Restrictions Precautions Precautions: Fall Precaution Comments: L foot trans met amp Required Braces or Orthoses: Other Brace Other Brace: has [employer]AR[employer] shoe in room previously owned Restrictions Weight Bearing Restrictions: No LLE Weight Bearing: Non weight bearing    Mobility  Bed Mobility Overal bed mobility: Modified Independent                  Transfers Overall transfer level: Needs assistance Equipment used: Rolling walker (2 wheeled) Transfers: Sit to/from Stand Sit to Stand: Supervision         General transfer comment: Performed safely with RW x 3  Ambulation/Gait Ambulation/Gait assistance: Supervision Gait Distance (Feet): 60 Feet (60'x2) Assistive device: Rolling walker (2 wheeled) (knee scooter)    Gait velocity: slow   General Gait Details: Pt able to ambulate 60' safely with RW and hop to pattern with NWB on L LE.  Went to PT gym to attempt crutches but no crutches were available.  Pt did try knee scooter with min guard for 60'.  Pt prefers just to stick with RW since going to rehab facility for 3 weeks and then may be able to WB (instructed to clarify with MD before weight bearing)   Stairs   Stairs assistance: Min assist Stair Management: Step to pattern;Forwards;Backwards;Two rails Number of Stairs: 1 (1'x2) General stair comments: Did 1 step with rails forward and 1 step with rails backward.  Increased step height to 6" today - pt reports more difficult but will be at rehab for 3 weeks before having to do step   Wheelchair Mobility    Modified Rankin (Stroke Patients Only)       Balance Overall balance assessment: Needs assistance Sitting-balance support: No upper extremity supported;Feet supported Sitting balance-Leahy Scale: Normal     Standing balance support: Bilateral upper extremity supported;During functional activity Standing balance-Leahy Scale: Poor Standing balance comment: reliant on BUE support                            Cognition Arousal/Alertness: Awake/alert Behavior During Therapy: WFL for tasks assessed/performed Overall Cognitive Status: Within Functional Limits for tasks assessed  Exercises      General Comments        Pertinent Vitals/Pain Pain Assessment: No/denies pain    Home Living                      Prior Function            PT Goals (current goals can now be found in the care plan section) Acute Rehab PT Goals Patient Stated Goal: go to rehab PT Goal Formulation: With patient Time For Goal Achievement: 11/30/20 Potential to Achieve Goals: Good Progress towards PT goals: Progressing toward goals    Frequency    Min 2X/week      PT  Plan Discharge plan needs to be updated    Co-evaluation              AM-PAC PT "6 Clicks" Mobility   Outcome Measure  Help needed turning from your back to your side while in a flat bed without using bedrails?: None Help needed moving from lying on your back to sitting on the side of a flat bed without using bedrails?: None Help needed moving to and from a bed to a chair (including a wheelchair)?: A Little Help needed standing up from a chair using your arms (e.g., wheelchair or bedside chair)?: A Little Help needed to walk in hospital room?: A Little Help needed climbing 3-5 steps with a railing? : A Little 6 Click Score: 20    End of Session Equipment Utilized During Treatment: Gait belt Activity Tolerance: Patient tolerated treatment well Patient left: in chair;with call bell/phone within reach Nurse Communication: Mobility status PT Visit Diagnosis: Unsteadiness on feet (R26.81);Muscle weakness (generalized) (M62.81);Other abnormalities of gait and mobility (R26.89)     Time: 1427-6701 PT Time Calculation (min) (ACUTE ONLY): 24 min  Charges:  $Gait Training: 23-37 mins                     Abran Richard, PT Acute Rehab Services Pager [phone] [facility] [place] 11/21/2020, 11:31 AM

## 2020-11-21 NOTE — Progress Notes (Signed)
Occupational Therapy Treatment Patient Details Name: [name] [name] MRN: [id number] DOB: [birth year] Today's Date: 11/21/2020    History of present illness Patient is a 60 y/o male presented to ED from wound care center for concerns of cellulitis of L great toe with ulceration and uncontrolled diabetes. Patient now s/p L transmetatarsal amputation on 3/18. PMH: depression, uncontrolled DM, GERD, hep C, neuropathy   OT comments  Pt in bed upon arrival, agreeable to OT session. Pt required supervision for LB dressing and minguard for toilet transfer. Pt had 1x diarrhea during session. Pt will continue to benefit from skilled OT services to maximize safety and independence with ADL/IADL and functional mobility. Will continue to follow acutely and progress as tolerated.    Follow Up Recommendations  SNF    Equipment Recommendations  3 in 1 bedside commode;Wheelchair (measurements OT);Wheelchair cushion (measurements OT);Other (comment) (RW, reacher)    Recommendations for Other Services      Precautions / Restrictions Precautions Precautions: Fall Precaution Comments: L foot trans met amp Required Braces or Orthoses: Other Brace Other Brace: has [employer] shoe in room previously owned Restrictions Weight Bearing Restrictions: No LLE Weight Bearing: Non weight bearing       Mobility Bed Mobility Overal bed mobility: Modified Independent                  Transfers Overall transfer level: Needs assistance Equipment used: Rolling walker (2 wheeled) Transfers: Sit to/from Stand Sit to Stand: Supervision         General transfer comment: Performed safely    Balance Overall balance assessment: Needs assistance Sitting-balance support: No upper extremity supported;Feet supported Sitting balance-Leahy Scale: Normal Sitting balance - Comments: able to complete LB ADLs from EOB with no LOB   Standing balance support: Bilateral upper extremity supported;During functional  activity Standing balance-Leahy Scale: Poor Standing balance comment: reliant on BUE support                           ADL either performed or assessed with clinical judgement   ADL Overall ADL's : Needs assistance/impaired Eating/Feeding: Independent;Sitting   Grooming: Independent;Sitting               Lower Body Dressing: Supervision/safety;Sit to/from stand Lower Body Dressing Details (indicate cue type and reason): pt utilized AE to assist with donning socks, however pt does not need AE for LB dressing. Pt able to figure-4 to complete LB dressing Toilet Transfer: Supervision/safety;Stand-pivot;RW;[employer] technical officer Details (indicate cue type and reason): from EOB to [facility], pt with x1 diarhea Toileting- Clothing Manipulation and Hygiene: Min guard;Sit to/from stand       Functional mobility during ADLs: Min guard;Rolling walker General ADL Comments: continues to demonstrate decreased activity tolerance, decreased stability and generalized deconditioning, continued to educate pt on fall prevention     Vision       Perception     Praxis      Cognition Arousal/Alertness: Awake/alert Behavior During Therapy: WFL for tasks assessed/performed Overall Cognitive Status: Within Functional Limits for tasks assessed                                          Exercises     Shoulder Instructions       General Comments      Pertinent Vitals/ Pain       Pain Assessment:  0-10 Pain Score: 5  Pain Location: L foot Pain Descriptors / Indicators: Aching Pain Intervention(s): Monitored during session;Limited activity within patient's tolerance  Home Living                                          Prior Functioning/Environment              Frequency  Min 2X/week        Progress Toward Goals  OT Goals(current goals can now be found in the care plan section)  Progress towards OT goals: Progressing toward  goals  Acute Rehab OT Goals Patient Stated Goal: go to rehab OT Goal Formulation: With patient Time For Goal Achievement: 11/30/20 Potential to Achieve Goals: Good ADL Goals Pt Will Perform Grooming: with supervision;with set-up;with modified independence;standing Pt Will Perform Lower Body Bathing: with min guard assist;with supervision Pt Will Perform Lower Body Dressing: with min guard assist;with supervision Pt Will Transfer to Toilet: with modified independence;ambulating;regular height toilet;bedside commode Pt Will Perform Toileting - Clothing Manipulation and hygiene: with supervision;with modified independence;sit to/from stand  Plan Discharge plan remains appropriate;Frequency remains appropriate    Co-evaluation                 AM-PAC OT "6 Clicks" Daily Activity     Outcome Measure   Help from another person eating meals?: None Help from another person taking care of personal grooming?: A Little Help from another person toileting, which includes using toliet, bedpan, or urinal?: A Little Help from another person bathing (including washing, rinsing, drying)?: A Little Help from another person to put on and taking off regular upper body clothing?: None Help from another person to put on and taking off regular lower body clothing?: A Little 6 Click Score: 20    End of Session Equipment Utilized During Treatment: Rolling walker  OT Visit Diagnosis: Unsteadiness on feet (R26.81);Other abnormalities of gait and mobility (R26.89);Pain Pain - Right/Left: Left Pain - part of body: Ankle and joints of foot   Activity Tolerance Patient tolerated treatment well   Patient Left in bed;with call bell/phone within reach;with bed alarm set   Nurse Communication Mobility status        Time: 1121-6244 OT Time Calculation (min): 19 min  Charges: OT General Charges $OT Visit: 1 Visit OT Treatments $Self Care/Home Management : 8-22 mins  Helene Kelp OTR/L Acute  Rehabilitation Services Office: [place] 11/21/2020, 4:05 PM

## 2020-11-21 NOTE — Plan of Care (Signed)
°  Problem: Clinical Measurements: Goal: Diagnostic test results will improve Outcome: Progressing   Problem: Activity: Goal: Risk for activity intolerance will decrease Outcome: Progressing   Problem: Nutrition: Goal: Adequate nutrition will be maintained Outcome: Progressing   Problem: Pain Managment: Goal: General experience of comfort will improve Outcome: Progressing   

## 2020-11-22 LAB — GLUCOSE, CAPILLARY
Glucose-Capillary: 183 mg/dL — ABNORMAL HIGH (ref 70–99)
Glucose-Capillary: 252 mg/dL — ABNORMAL HIGH (ref 70–99)
Glucose-Capillary: 243 mg/dL — ABNORMAL HIGH (ref 70–99)
Glucose-Capillary: 316 mg/dL — ABNORMAL HIGH (ref 70–99)
Glucose-Capillary: 224 mg/dL — ABNORMAL HIGH (ref 70–99)

## 2020-11-22 MED ORDER — INSULIN GLARGINE 100 UNIT/ML ~~LOC~~ SOLN
58.0000 [IU] | Freq: Every day | SUBCUTANEOUS | Status: DC
  Administered 2020-11-22 – 2020-11-23 (×2): 58 [IU] via SUBCUTANEOUS
  Filled 2020-11-22 (×3): qty 0.58

## 2020-11-22 NOTE — TOC Progression Note (Signed)
Transition of Care [facility]) - Progression Note    Patient Details  Name: [name] [name] MRN: [id number] Date of Birth: 28-Nov-1960  Transition of Care [facility]) CM/SW [place], [place] Phone Number: 11/22/2020, 3:15 PM  Clinical Narrative:    CSW confirmed with dr. Parks Ranger pt is ready to DC whenever facility requirements have been met. CSW attempted to follow up with facility to confirm when they can take pt. VM was left. CSW spoke with pt and mother at bedside. They were advised that the [facility] has accepted the LOG, but they have to be able to maintain his place at the boardinghouse to have to DC to afterwards. Pt noted he has been working with financial counseling, but needs additional resources as he cannot afford it at this time. Pt states he pays 120 a week. SW will need to provide additional resources. Pt's mother asked if pt can go to [facility], barriers were explained, but a VM was left to confirm with facility. SW will continue to follow for DC planning.    Expected Discharge Plan: [place] Barriers to Discharge: Continued Medical Work up  Expected Discharge Plan and Services Expected Discharge Plan: [place] In-house Referral: Clinical Social Work     Living arrangements for the past 2 months: [place] Determinants of Health (SDOH) Interventions    Readmission Risk Interventions Readmission Risk Prevention Plan 11/14/2020  Transportation Screening Complete  HRI or [place] Complete  Social Work Consult for [place] Planning/Counseling Complete  Palliative Care Screening Not Applicable  Medication Review Press photographer) Complete  Some recent data might be hidden

## 2020-11-22 NOTE — Progress Notes (Signed)
PT Cancellation Note  Patient Details Name: [name] [name] MRN: [id number] DOB: 1960/11/26   Cancelled Treatment:    Reason Eval/Treat Not Completed: Other (comment).  Pt visiting with an older lady and declined wanting to practice any mobility at this time.  He is satisfied with his current level of hopping with the RW and did not feel like hopping around the unit at this time.  He was agreeable for PT to check back on Monday.  Thanks,  Verdene Lennert, PT, DPT  Acute Rehabilitation [phone] pager #[phone] office       [name] [name] 11/22/2020, 2:25 PM

## 2020-11-22 NOTE — Progress Notes (Signed)
[name] [name]  YBO:[id number] DOB: [birth year] DOA: 11/13/2020 PCP: [name], NP    Brief Narrative:  [phone] with a history of DM2, HTN, chronic hepatitis C, and GERD who presented to the ER with a worsening left foot wound.  Following his admission empiric vancomycin and cefepime were initiated.  MRI confirmed osteomyelitis and abscess of the affected foot.  Orthopedic surgery was consulted and took the patient to the OR for a transmetatarsal amputation 3/18.  Antimicrobials:  Cefepime 3/17 > Vancomycin 3/17 > Flagyl 3/17 >319  DVT prophylaxis: Subcu heparin  Consultants:  none  Subjective: Clinically stable awaiting safe disposition.  CBGs improving.  No new complaints today.  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 - being treated with vancomycin and cefepime - intraoperative culture positive for Pseudomonas and Staph epi - ID consulted and directed antibiotic therapy -PICC line placed 3/22 with plan for 6 weeks of antibiotics -to follow-up with ID in the outpatient setting -wound improving per orthopedic service with plan to follow-up in the office in 1 week  DM2 On Invokana and glipizide as outpatient - A1c 11.22/22 -Lantus started as inpatient -CBGs improving but not yet ideal - adjust tx again today -educated patient that he will need to continue insulin dosing post discharge, certainly at least while he is at the rehab facility and possibly long-term depending upon his CBGs as he continues to improve  Hyponatremia Some element of pseudohyponatremia related to hyperglycemia, and also likely related to cirrhosis - follow trend with increased intake and improved CBG control  HTN Blood pressure currently reasonably controlled  History of alcohol abuse and cirrhosis of the liver Denies current alcohol use - MRI abdomen 2018 suggested early cirrhosis  Chronic  anemia Hemoglobin has been stable  HLD Continue Lipitor  Depression Continue usual Cymbalta and Remeron  GERD Continue PPI  Difficult disposition Patient does not have a suitable environment in which to be discharged to allow for proper wound care and IV abx - hopeful for SNF placement   Code Status: NO CODE BLUE Family Communication: Spoke at length with patient and mother at bedside Status is: Inpatient  Remains inpatient appropriate because:Unsafe d/c plan   Dispo: The patient is from: Home              Anticipated d/c is to: SNF              Patient currently is medically stable to d/c.   Difficult to place patient No    Objective: Blood pressure (!) 143/80, pulse 72, temperature 98.8 F (37.1 C), temperature source Oral, resp. rate 17, height 5\' 11"  (1.803 m), weight 74.5 kg, SpO2 100 %.  Intake/Output Summary (Last 24 hours) at 11/22/2020 1015 Last data filed at 11/22/2020 0451 Gross per 24 hour  Intake 300 ml  Output 600 ml  Net -300 ml   Filed Weights   11/13/20 1546 11/14/20 0057 11/15/20 0842  Weight: 74.4 kg 74.5 kg 74.5 kg    Examination: General: No acute respiratory distress Lungs: CTA B -no wheezing Cardiovascular: RRR without murmur or rub Abdomen: NT/ND, soft, BS positive, no rebound Extremities: Wound dressed and dry, no lower extremity edema bilaterally  CBC: Recent Labs  Lab 11/16/20 0123 11/17/20 0131 11/21/20 0345  WBC 13.3* 9.4 10.5  HGB 12.4* 12.7* 12.3*  HCT 37.4* 38.5* 37.7*  MCV 89.7 89.3 90.8  PLT 357 360 306   Basic  Metabolic Panel: Recent Labs  Lab 11/16/20 0123 11/17/20 0131 11/21/20 0345  NA 131* 133* 133*  K 3.8 4.1 3.9  CL 103 101 101  CO2 25 27 28   GLUCOSE 268* 229* 204*  BUN 9 8 7   CREATININE 0.58* 0.45* 0.47*  CALCIUM 8.2* 8.5* 8.9  MG  --   --  1.9   GFR: Estimated Creatinine Clearance: 106.1 mL/min (A) (by C-G formula based on SCr of 0.47 mg/dL (L)).  Liver Function Tests: No results for  input(s): AST, ALT, ALKPHOS, BILITOT, PROT, ALBUMIN in the last 168 hours.  Coagulation Profile: No results for input(s): INR, PROTIME in the last 168 hours.  HbA1C: Hgb A1c MFr Bld  Date/Time Value Ref Range Status  10/28/2020 10:14 AM 11.2 (H) 4.8 - 5.6 % Final    Comment:    (NOTE) Pre diabetes:          5.7%-6.4%  Diabetes:              >6.4%  Glycemic control for   <7.0% adults with diabetes   09/23/2020 10:37 AM 12.1 (H) 4.8 - 5.6 % Final    Comment:             Prediabetes: 5.7 - 6.4          Diabetes: >6.4          Glycemic control for adults with diabetes: <7.0     CBG: Recent Labs  Lab 11/21/20 1123 11/21/20 1710 11/21/20 2023 11/22/20 0449 11/22/20 0806  GLUCAP 278* 266* 288* 183* 252*    Recent Results (from the past 240 hour(s))  SARS CORONAVIRUS 2 (TAT 6-24 HRS) Nasopharyngeal Nasopharyngeal Swab     Status: None   Collection Time: 11/13/20  9:37 PM   Specimen: Nasopharyngeal Swab  Result Value Ref Range Status   SARS Coronavirus 2 NEGATIVE NEGATIVE Final    Comment: (NOTE) SARS-CoV-2 target nucleic acids are NOT DETECTED.  The SARS-CoV-2 RNA is generally detectable in upper and lower respiratory specimens during the acute phase of infection. Negative results do not preclude SARS-CoV-2 infection, do not rule out co-infections with other pathogens, and should not be used as the sole basis for treatment or other patient management decisions. Negative results must be combined with clinical observations, patient history, and epidemiological information. The expected result is Negative.  Fact Sheet for Patients: SugarRoll.be  Fact Sheet for Healthcare Providers: https://www.woods-mathews.com/  This test is not yet approved or cleared by the [place] FDA and  has been authorized for detection and/or diagnosis of SARS-CoV-2 by FDA under an Emergency Use Authorization (EUA). This EUA will remain  in  effect (meaning this test can be used) for the duration of the COVID-19 declaration under Se ction 564(b)(1) of the Act, 21 U.S.C. section 360bbb-3(b)(1), unless the authorization is terminated or revoked sooner.  Performed at [facility], [place] [address] [place], [place] [address]   MRSA PCR Screening     Status: None   Collection Time: 11/14/20 11:16 PM   Specimen: Nasal Mucosa; Nasopharyngeal  Result Value Ref Range Status   MRSA by PCR NEGATIVE NEGATIVE Final    Comment:        The GeneXpert MRSA Assay (FDA approved for NASAL specimens only), is one component of a comprehensive MRSA colonization surveillance program. It is not intended to diagnose MRSA infection nor to guide or monitor treatment for MRSA infections. Performed at [facility], [place] [address], [place], [place] [address]  Aerobic/Anaerobic Culture w Gram Stain (surgical/deep wound)     Status: None   Collection Time: 11/15/20  9:20 AM   Specimen: PATH Other; Tissue  Result Value Ref Range Status   Specimen Description TISSUE  Final   Special Requests LEFT FOOT SPEC A  Final   Gram Stain   Final    ABUNDANT WBC PRESENT, PREDOMINANTLY PMN FEW GRAM POSITIVE COCCI RARE GRAM NEGATIVE RODS    Culture   Final    ABUNDANT PSEUDOMONAS AERUGINOSA ABUNDANT STAPHYLOCOCCUS EPIDERMIDIS NO ANAEROBES ISOLATED Performed at [facility], [place] [address], [place], [place] [address]    Report Status 11/18/2020 FINAL  Final   Organism ID, Bacteria PSEUDOMONAS AERUGINOSA  Final   Organism ID, Bacteria STAPHYLOCOCCUS EPIDERMIDIS  Final      Susceptibility   Pseudomonas aeruginosa - MIC*    CEFTAZIDIME 4 SENSITIVE Sensitive     CIPROFLOXACIN 0.5 SENSITIVE Sensitive     GENTAMICIN <=1 SENSITIVE Sensitive     IMIPENEM 2 SENSITIVE Sensitive     * ABUNDANT PSEUDOMONAS AERUGINOSA   Staphylococcus epidermidis - MIC*    CIPROFLOXACIN <=0.5 SENSITIVE Sensitive     ERYTHROMYCIN >=8 RESISTANT Resistant      GENTAMICIN <=0.5 SENSITIVE Sensitive     OXACILLIN >=4 RESISTANT Resistant     TETRACYCLINE 2 SENSITIVE Sensitive     VANCOMYCIN 2 SENSITIVE Sensitive     TRIMETH/SULFA 160 RESISTANT Resistant     CLINDAMYCIN >=8 RESISTANT Resistant     RIFAMPIN <=0.5 SENSITIVE Sensitive     Inducible Clindamycin NEGATIVE Sensitive     * ABUNDANT STAPHYLOCOCCUS EPIDERMIDIS  C Difficile Quick Screen w PCR reflex     Status: None   Collection Time: 11/16/20  1:17 AM   Specimen: STOOL  Result Value Ref Range Status   C Diff antigen NEGATIVE NEGATIVE Final   C Diff toxin NEGATIVE NEGATIVE Final   C Diff interpretation No C. difficile detected.  Final    Comment: Performed at [facility], [place] [address] [place], [place] [address]     Scheduled Meds: . aspirin EC  81 mg Oral Daily  . atorvastatin  40 mg Oral Daily  . Chlorhexidine Gluconate Cloth  6 each Topical Daily  . DULoxetine  60 mg Oral Daily  . folic acid  1 mg Oral Daily  . heparin  5,000 Units Subcutaneous Q8H  . insulin aspart  0-20 Units Subcutaneous TID WC  . insulin aspart  0-5 Units Subcutaneous QHS  . insulin aspart  12 Units Subcutaneous TID WC  . insulin glargine  48 Units Subcutaneous QHS  . losartan  50 mg Oral Daily  . mirtazapine  15 mg Oral QHS  . multivitamin with minerals  1 tablet Oral Daily  . nicotine  7 mg Transdermal Daily  . pantoprazole  40 mg Oral Daily  . saccharomyces boulardii  250 mg Oral BID   Continuous Infusions: . sodium chloride 10 mL/hr at 11/17/20 2200  . ceFEPime (MAXIPIME) IV 2 g (11/22/20 0820)  . vancomycin 1,500 mg (11/21/20 2351)     LOS: 9 days   Cherene Altes, MD [facility] Office  [phone] Pager - Text Page per Amion  If 7PM-7AM, please contact night-coverage per Amion 11/22/2020, 10:15 AM

## 2020-11-23 DIAGNOSIS — L02612 Cutaneous abscess of left foot: Secondary | ICD-10-CM

## 2020-11-23 LAB — GLUCOSE, CAPILLARY
Glucose-Capillary: 228 mg/dL — ABNORMAL HIGH (ref 70–99)
Glucose-Capillary: 253 mg/dL — ABNORMAL HIGH (ref 70–99)
Glucose-Capillary: 285 mg/dL — ABNORMAL HIGH (ref 70–99)
Glucose-Capillary: 338 mg/dL — ABNORMAL HIGH (ref 70–99)

## 2020-11-23 NOTE — Progress Notes (Signed)
Inpatient Diabetes Program Recommendations  AACE/ADA: New Consensus Statement on Inpatient Glycemic Control (2015)  Target Ranges:  Prepandial:   less than 140 mg/dL      Peak postprandial:   less than 180 mg/dL (1-2 hours)      Critically ill patients:  140 - 180 mg/dL   Lab Results  Component Value Date   GLUCAP 228 (H) 11/23/2020   HGBA1C 11.2 (H) 10/28/2020    Review of Glycemic Control  Diabetes history: DM Outpatient Diabetes medications: Invokana 300 mg Daily, Glipizide 5 mg Daily, Lantus 20 units qhs Current orders for Inpatient glycemic control:  Lantus 58 units Novolog 0-20 units tid + hs Novolog 12 units tid meal coverage  Consult: patient intereseted in a continuos CBG monitor if possible  Inpatient Diabetes Program Recommendations:    -  Consider lantus 35 units bid (for possible better absorption)  Note pt d/cing to possible SNF, CGM's not used in facilities. Also note no insurance for pt.  CGM's are at least $75/month for sensors alone for the [facility]. We do not have samples to give pt.  If pt would like to pay out of pocket the order numbers are:  [facility] Libre 14 Day Sensor Misc order # [id number] [place] Night 14 Day Reader Device order # [id number]  Thanks,  Tama Headings RN, MSN, BC-ADM Inpatient Diabetes Coordinator Team Pager [phone] (8a-5p)

## 2020-11-23 NOTE — TOC Progression Note (Addendum)
Transition of Care [facility]) - Progression Note    Patient Details  Name: ABANOUB HANKEN MRN: [id number] Date of Birth: [birth year]  Transition of Care [facility]) CM/SW [place], [place] Phone Number: 11/23/2020, 1:31 PM  Clinical Narrative:     CSW spoke with patient who confirmed that he is awaiting to speak with financial counseling on some questions he has. [facility] accepted the LOG. [place] just needs to confirm that he can maintain his place at the boardinghouse that patient will  DC to afterwards. To help confirm this patient has questions for financial counseling. CSW emailed [employer] with financial counseling as well as called financial counseling. CSW waiting on response.CSW will continue to follow to help assist with discharge planning needs.  Expected Discharge Plan: [place] Barriers to Discharge: Continued Medical Work up  Expected Discharge Plan and Services Expected Discharge Plan: [place] In-house Referral: Clinical Social Work     Living arrangements for the past 2 months: [place] Determinants of Health (SDOH) Interventions    Readmission Risk Interventions Readmission Risk Prevention Plan 11/14/2020  Transportation Screening Complete  HRI or [place] Complete  Social Work Consult for [place] Planning/Counseling Complete  Palliative Care Screening Not Applicable  Medication Review Press photographer) Complete  Some recent data might be hidden

## 2020-11-23 NOTE — Progress Notes (Signed)
[name] [name]  WFU:[id number] DOB: 1961/04/14 DOA: 11/13/2020 PCP: [name], NP    Brief Narrative:  [phone] with a history of DM2, HTN, chronic hepatitis C, and GERD who presented to the ER with a worsening left foot wound.  Following his admission empiric vancomycin and cefepime were initiated.  MRI confirmed osteomyelitis and abscess of the affected foot.  Orthopedic surgery was consulted and took the patient to the OR for a transmetatarsal amputation 3/18.  Antimicrobials:  Cefepime 3/17 > Vancomycin 3/17 > Flagyl 3/17 >319  DVT prophylaxis: Subcu heparin  Consultants:  none  Subjective: Clinically stable awaiting safe disposition.    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 - being treated with vancomycin and cefepime - intraoperative culture positive for Pseudomonas and Staph epi - ID consulted and directed antibiotic therapy -PICC line placed 3/22 with plan for 6 weeks of antibiotics -to follow-up with ID in the outpatient setting -wound improving per orthopedic service with plan to follow-up in the office in 1 week  DM2 On Invokana and glipizide as outpatient - A1c 11.22/22 -Lantus started as inpatient -CBGs improving but not yet ideal - adjust tx again today -educated patient that he will need to continue insulin dosing post discharge, certainly at least while he is at the rehab facility and possibly long-term depending upon his CBGs as he continues to improve  Hyponatremia Some element of pseudohyponatremia related to hyperglycemia, and also likely related to cirrhosis - follow trend with increased intake and improved CBG control  HTN Blood pressure currently reasonably controlled  History of alcohol abuse and cirrhosis of the liver Denies current alcohol use - MRI abdomen 2018 suggested early cirrhosis  Chronic anemia Hemoglobin has been stable  HLD Continue  Lipitor  Depression Continue usual Cymbalta and Remeron  GERD Continue PPI  Difficult disposition Patient does not have a suitable environment in which to be discharged to allow for proper wound care and IV abx - hopeful for SNF placement   Code Status: NO CODE BLUE Family Communication: No family present at time of exam today Status is: Inpatient  Remains inpatient appropriate because:Unsafe d/c plan   Dispo: The patient is from: Home              Anticipated d/c is to: SNF              Patient currently is medically stable to d/c.   Difficult to place patient No    Objective: Blood pressure 137/72, pulse 72, temperature 98.1 F (36.7 C), temperature source Oral, resp. rate 15, height 5\' 11"  (1.803 m), weight 74.5 kg, SpO2 95 %.  Intake/Output Summary (Last 24 hours) at 11/23/2020 0930 Last data filed at 11/23/2020 0500 Gross per 24 hour  Intake 240 ml  Output 3103 ml  Net -2863 ml   Filed Weights   11/13/20 1546 11/14/20 0057 11/15/20 0842  Weight: 74.4 kg 74.5 kg 74.5 kg    Examination: General: No acute respiratory distress Lungs: CTA B  Cardiovascular: RRR without murmur or rub Abdomen: NT/ND, soft, BS positive, no rebound Extremities: Wound dressed and dry, no lower extremity edema B  CBC: Recent Labs  Lab 11/17/20 0131 11/21/20 0345  WBC 9.4 10.5  HGB 12.7* 12.3*  HCT 38.5* 37.7*  MCV 89.3 90.8  PLT 360 427   Basic Metabolic Panel: Recent Labs  Lab 11/17/20 0131 11/21/20 0345  NA 133* 133*  K  4.1 3.9  CL 101 101  CO2 27 28  GLUCOSE 229* 204*  BUN 8 7  CREATININE 0.45* 0.47*  CALCIUM 8.5* 8.9  MG  --  1.9   GFR: Estimated Creatinine Clearance: 106.1 mL/min (A) (by C-G formula based on SCr of 0.47 mg/dL (L)).  Liver Function Tests: No results for input(s): AST, ALT, ALKPHOS, BILITOT, PROT, ALBUMIN in the last 168 hours.  Coagulation Profile: No results for input(s): INR, PROTIME in the last 168 hours.  HbA1C: Hgb A1c MFr Bld   Date/Time Value Ref Range Status  10/28/2020 10:14 AM 11.2 (H) 4.8 - 5.6 % Final    Comment:    (NOTE) Pre diabetes:          5.7%-6.4%  Diabetes:              >6.4%  Glycemic control for   <7.0% adults with diabetes   09/23/2020 10:37 AM 12.1 (H) 4.8 - 5.6 % Final    Comment:             Prediabetes: 5.7 - 6.4          Diabetes: >6.4          Glycemic control for adults with diabetes: <7.0     CBG: Recent Labs  Lab 11/22/20 0806 11/22/20 1115 11/22/20 1737 11/22/20 2013 11/23/20 0618  GLUCAP 252* 243* 316* 224* 228*    Recent Results (from the past 240 hour(s))  SARS CORONAVIRUS 2 (TAT 6-24 HRS) Nasopharyngeal Nasopharyngeal Swab     Status: None   Collection Time: 11/13/20  9:37 PM   Specimen: Nasopharyngeal Swab  Result Value Ref Range Status   SARS Coronavirus 2 NEGATIVE NEGATIVE Final    Comment: (NOTE) SARS-CoV-2 target nucleic acids are NOT DETECTED.  The SARS-CoV-2 RNA is generally detectable in upper and lower respiratory specimens during the acute phase of infection. Negative results do not preclude SARS-CoV-2 infection, do not rule out co-infections with other pathogens, and should not be used as the sole basis for treatment or other patient management decisions. Negative results must be combined with clinical observations, patient history, and epidemiological information. The expected result is Negative.  Fact Sheet for Patients: SugarRoll.be  Fact Sheet for Healthcare Providers: https://www.woods-mathews.com/  This test is not yet approved or cleared by the [place] FDA and  has been authorized for detection and/or diagnosis of SARS-CoV-2 by FDA under an Emergency Use Authorization (EUA). This EUA will remain  in effect (meaning this test can be used) for the duration of the COVID-19 declaration under Se ction 564(b)(1) of the Act, 21 U.S.C. section 360bbb-3(b)(1), unless the authorization is  terminated or revoked sooner.  Performed at [facility], [place] [address] [place], [place] [address]   MRSA PCR Screening     Status: None   Collection Time: 11/14/20 11:16 PM   Specimen: Nasal Mucosa; Nasopharyngeal  Result Value Ref Range Status   MRSA by PCR NEGATIVE NEGATIVE Final    Comment:        The GeneXpert MRSA Assay (FDA approved for NASAL specimens only), is one component of a comprehensive MRSA colonization surveillance program. It is not intended to diagnose MRSA infection nor to guide or monitor treatment for MRSA infections. Performed at [facility], [place] [address], [place], [place] [address]   Aerobic/Anaerobic Culture w Gram Stain (surgical/deep wound)     Status: None   Collection Time: 11/15/20  9:20 AM   Specimen: PATH Other; Tissue  Result Value Ref Range Status   Specimen Description TISSUE  Final   Special Requests LEFT FOOT SPEC A  Final   Gram Stain   Final    ABUNDANT WBC PRESENT, PREDOMINANTLY PMN FEW GRAM POSITIVE COCCI RARE GRAM NEGATIVE RODS    Culture   Final    ABUNDANT PSEUDOMONAS AERUGINOSA ABUNDANT STAPHYLOCOCCUS EPIDERMIDIS NO ANAEROBES ISOLATED Performed at [facility], [address], [place], [place] [address]    Report Status 11/18/2020 FINAL  Final   Organism ID, Bacteria PSEUDOMONAS AERUGINOSA  Final   Organism ID, Bacteria STAPHYLOCOCCUS EPIDERMIDIS  Final      Susceptibility   Pseudomonas aeruginosa - MIC*    CEFTAZIDIME 4 SENSITIVE Sensitive     CIPROFLOXACIN 0.5 SENSITIVE Sensitive     GENTAMICIN <=1 SENSITIVE Sensitive     IMIPENEM 2 SENSITIVE Sensitive     * ABUNDANT PSEUDOMONAS AERUGINOSA   Staphylococcus epidermidis - MIC*    CIPROFLOXACIN <=0.5 SENSITIVE Sensitive     ERYTHROMYCIN >=8 RESISTANT Resistant     GENTAMICIN <=0.5 SENSITIVE Sensitive     OXACILLIN >=4 RESISTANT Resistant     TETRACYCLINE 2 SENSITIVE Sensitive     VANCOMYCIN 2 SENSITIVE Sensitive     TRIMETH/SULFA 160 RESISTANT  Resistant     CLINDAMYCIN >=8 RESISTANT Resistant     RIFAMPIN <=0.5 SENSITIVE Sensitive     Inducible Clindamycin NEGATIVE Sensitive     * ABUNDANT STAPHYLOCOCCUS EPIDERMIDIS  C Difficile Quick Screen w PCR reflex     Status: None   Collection Time: 11/16/20  1:17 AM   Specimen: STOOL  Result Value Ref Range Status   C Diff antigen NEGATIVE NEGATIVE Final   C Diff toxin NEGATIVE NEGATIVE Final   C Diff interpretation No C. difficile detected.  Final    Comment: Performed at [facility], [place] [name] [place], [place] [address]     Scheduled Meds: . aspirin EC  81 mg Oral Daily  . atorvastatin  40 mg Oral Daily  . Chlorhexidine Gluconate Cloth  6 each Topical Daily  . DULoxetine  60 mg Oral Daily  . folic acid  1 mg Oral Daily  . heparin  5,000 Units Subcutaneous Q8H  . insulin aspart  0-20 Units Subcutaneous TID WC  . insulin aspart  0-5 Units Subcutaneous QHS  . insulin aspart  12 Units Subcutaneous TID WC  . insulin glargine  58 Units Subcutaneous QHS  . losartan  50 mg Oral Daily  . mirtazapine  15 mg Oral QHS  . multivitamin with minerals  1 tablet Oral Daily  . nicotine  7 mg Transdermal Daily  . pantoprazole  40 mg Oral Daily  . saccharomyces boulardii  250 mg Oral BID   Continuous Infusions: . sodium chloride 10 mL/hr at 11/17/20 2200  . ceFEPime (MAXIPIME) IV 2 g (11/23/20 0819)  . vancomycin 1,500 mg (11/22/20 2314)     LOS: 10 days   Cherene Altes, MD [facility] Office  [phone] Pager - Text Page per Amion  If 7PM-7AM, please contact night-coverage per Amion 11/23/2020, 9:30 AM

## 2020-11-23 NOTE — Plan of Care (Signed)
  Problem: Health Behavior/Discharge Planning: Goal: Ability to manage health-related needs will improve Outcome: Progressing   Problem: Clinical Measurements: Goal: Ability to maintain clinical measurements within normal limits will improve Outcome: Progressing   Problem: Activity: Goal: Risk for activity intolerance will decrease Outcome: Progressing   Problem: Nutrition: Goal: Adequate nutrition will be maintained Outcome: Progressing   Problem: Coping: Goal: Level of anxiety will decrease Outcome: Progressing   Problem: Elimination: Goal: Will not experience complications related to bowel motility Outcome: Progressing Goal: Will not experience complications related to urinary retention Outcome: Progressing   Problem: Pain Managment: Goal: General experience of comfort will improve Outcome: Progressing   Problem: Safety: Goal: Ability to remain free from injury will improve Outcome: Progressing

## 2020-11-24 LAB — GLUCOSE, CAPILLARY
Glucose-Capillary: 388 mg/dL — ABNORMAL HIGH (ref 70–99)
Glucose-Capillary: 181 mg/dL — ABNORMAL HIGH (ref 70–99)
Glucose-Capillary: 167 mg/dL — ABNORMAL HIGH (ref 70–99)
Glucose-Capillary: 272 mg/dL — ABNORMAL HIGH (ref 70–99)

## 2020-11-24 MED ORDER — OXYCODONE HCL 5 MG PO TABS
5.0000 mg | ORAL_TABLET | ORAL | Status: DC | PRN
  Administered 2020-11-24 – 2020-11-26 (×9): 10 mg via ORAL
  Filled 2020-11-24 (×9): qty 2

## 2020-11-24 MED ORDER — TRAMADOL HCL 50 MG PO TABS: 50.0000 mg | ORAL_TABLET | Freq: Four times a day (QID) | ORAL | Status: DC | PRN

## 2020-11-24 MED ORDER — INSULIN GLARGINE 100 UNIT/ML ~~LOC~~ SOLN
34.0000 [IU] | Freq: Two times a day (BID) | SUBCUTANEOUS | Status: DC
  Administered 2020-11-24 – 2020-11-25 (×3): 34 [IU] via SUBCUTANEOUS
  Filled 2020-11-24 (×4): qty 0.34

## 2020-11-24 MED ORDER — INSULIN ASPART 100 UNIT/ML ~~LOC~~ SOLN
8.0000 [IU] | Freq: Three times a day (TID) | SUBCUTANEOUS | Status: DC
  Administered 2020-11-24 – 2020-11-26 (×6): 8 [IU] via SUBCUTANEOUS

## 2020-11-24 MED ORDER — INSULIN ASPART 100 UNIT/ML ~~LOC~~ SOLN: 14.0000 [IU] | Freq: Three times a day (TID) | SUBCUTANEOUS | Status: DC

## 2020-11-24 NOTE — Progress Notes (Signed)
[name] [name]  GGY:[id number] DOB: Apr 02, 1961 DOA: 11/13/2020 PCP: [name], NP    Brief Narrative:  [phone] with a history of DM2, HTN, chronic hepatitis C, and GERD who presented to the ER with a worsening left foot wound.  Following his admission empiric vancomycin and cefepime were initiated.  MRI confirmed osteomyelitis and abscess of the affected foot.  Orthopedic surgery was consulted and took the patient to the OR for a transmetatarsal amputation 3/18.  Antimicrobials:  Cefepime 3/17 > Vancomycin 3/17 > Flagyl 3/17 >319  DVT prophylaxis: Subcu heparin  Consultants:  none  Subjective: Clinically stable awaiting safe disposition.  Afebrile.  Vital signs stable.  CBG remains quite variable but not yet at goal.  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 - being treated with vancomycin and cefepime - intraoperative culture positive for Pseudomonas and Staph epi - ID consulted and directed antibiotic therapy - PICC line placed 3/22 with plan for 6 weeks of antibiotics -to follow-up with ID in the outpatient setting -wound improving per Orthopedic service with plan to follow-up in the office in 1 week  DM2 uncontrolled with hyperglycemia and diabetic foot wound On Invokana and glipizide as outpatient - A1c 11.22/22 -Lantus started as inpatient -CBGs not yet at goal- adjust tx again today -educated patient that he will need to continue insulin dosing post discharge, certainly at least while he is at the rehab facility and possibly long-term depending upon his CBGs as he continues to improve  Hyponatremia Some element of pseudohyponatremia related to hyperglycemia, and also likely related to cirrhosis - follow trend with increased intake and improved CBG control  HTN Blood pressure currently reasonably controlled  History of alcohol abuse and cirrhosis of the liver Denies current  alcohol use - MRI abdomen 2018 suggested early cirrhosis  Chronic anemia Hemoglobin has been stable  HLD Continue Lipitor  Depression Continue usual Cymbalta and Remeron  GERD Continue PPI  Difficult disposition Patient does not have a suitable environment in which to be discharged to allow for proper wound care and IV abx - hopeful for SNF placement   Code Status: NO CODE BLUE Family Communication: No family present at time of exam today Status is: Inpatient  Remains inpatient appropriate because:Unsafe d/c plan   Dispo: The patient is from: Home              Anticipated d/c is to: SNF              Patient currently is medically stable to d/c.   Difficult to place patient No    Objective: Blood pressure 117/63, pulse 69, temperature 97.6 F (36.4 C), temperature source Oral, resp. rate 18, height 5\' 11"  (1.803 m), weight 74.5 kg, SpO2 98 %.  Intake/Output Summary (Last 24 hours) at 11/24/2020 0933 Last data filed at 11/24/2020 0654 Gross per 24 hour  Intake 2542.13 ml  Output 2501 ml  Net 41.13 ml   Filed Weights   11/13/20 1546 11/14/20 0057 11/15/20 0842  Weight: 74.4 kg 74.5 kg 74.5 kg    Examination: General: No acute respiratory distress Lungs: CTA B without wheezing Cardiovascular: RRR  Abdomen: NT/ND, soft, BS positive, no rebound Extremities: Wound dressed and dry, no lower extremity edema bilaterally  CBC: Recent Labs  Lab 11/21/20 0345  WBC 10.5  HGB 12.3*  HCT 37.7*  MCV 90.8  PLT 093   Basic Metabolic Panel: Recent Labs  Lab 11/21/20 0345  NA 133*  K 3.9  CL 101  CO2 28  GLUCOSE 204*  BUN 7  CREATININE 0.47*  CALCIUM 8.9  MG 1.9   GFR: Estimated Creatinine Clearance: 106.1 mL/min (A) (by C-G formula based on SCr of 0.47 mg/dL (L)).  Liver Function Tests: No results for input(s): AST, ALT, ALKPHOS, BILITOT, PROT, ALBUMIN in the last 168 hours.  Coagulation Profile: No results for input(s): INR, PROTIME in the last 168  hours.  HbA1C: Hgb A1c MFr Bld  Date/Time Value Ref Range Status  10/28/2020 10:14 AM 11.2 (H) 4.8 - 5.6 % Final    Comment:    (NOTE) Pre diabetes:          5.7%-6.4%  Diabetes:              >6.4%  Glycemic control for   <7.0% adults with diabetes   09/23/2020 10:37 AM 12.1 (H) 4.8 - 5.6 % Final    Comment:             Prediabetes: 5.7 - 6.4          Diabetes: >6.4          Glycemic control for adults with diabetes: <7.0     CBG: Recent Labs  Lab 11/23/20 0618 11/23/20 1111 11/23/20 1607 11/23/20 1946 11/24/20 0643  GLUCAP 228* 253* 285* 338* 388*    Recent Results (from the past 240 hour(s))  MRSA PCR Screening     Status: None   Collection Time: 11/14/20 11:16 PM   Specimen: Nasal Mucosa; Nasopharyngeal  Result Value Ref Range Status   MRSA by PCR NEGATIVE NEGATIVE Final    Comment:        The GeneXpert MRSA Assay (FDA approved for NASAL specimens only), is one component of a comprehensive MRSA colonization surveillance program. It is not intended to diagnose MRSA infection nor to guide or monitor treatment for MRSA infections. Performed at [facility], [place] [address], [place], [place] [address]   Aerobic/Anaerobic Culture w Gram Stain (surgical/deep wound)     Status: None   Collection Time: 11/15/20  9:20 AM   Specimen: PATH Other; Tissue  Result Value Ref Range Status   Specimen Description TISSUE  Final   Special Requests LEFT FOOT SPEC A  Final   Gram Stain   Final    ABUNDANT WBC PRESENT, PREDOMINANTLY PMN FEW GRAM POSITIVE COCCI RARE GRAM NEGATIVE RODS    Culture   Final    ABUNDANT PSEUDOMONAS AERUGINOSA ABUNDANT STAPHYLOCOCCUS EPIDERMIDIS NO ANAEROBES ISOLATED Performed at [facility], [place] [address], [place], [place] [address]    Report Status 11/18/2020 FINAL  Final   Organism ID, Bacteria PSEUDOMONAS AERUGINOSA  Final   Organism ID, Bacteria STAPHYLOCOCCUS EPIDERMIDIS  Final      Susceptibility   Pseudomonas  aeruginosa - MIC*    CEFTAZIDIME 4 SENSITIVE Sensitive     CIPROFLOXACIN 0.5 SENSITIVE Sensitive     GENTAMICIN <=1 SENSITIVE Sensitive     IMIPENEM 2 SENSITIVE Sensitive     * ABUNDANT PSEUDOMONAS AERUGINOSA   Staphylococcus epidermidis - MIC*    CIPROFLOXACIN <=0.5 SENSITIVE Sensitive     ERYTHROMYCIN >=8 RESISTANT Resistant     GENTAMICIN <=0.5 SENSITIVE Sensitive     OXACILLIN >=4 RESISTANT Resistant     TETRACYCLINE 2 SENSITIVE Sensitive     VANCOMYCIN 2 SENSITIVE Sensitive     TRIMETH/SULFA 160 RESISTANT Resistant     CLINDAMYCIN >=8 RESISTANT Resistant  RIFAMPIN <=0.5 SENSITIVE Sensitive     Inducible Clindamycin NEGATIVE Sensitive     * ABUNDANT STAPHYLOCOCCUS EPIDERMIDIS  C Difficile Quick Screen w PCR reflex     Status: None   Collection Time: 11/16/20  1:17 AM   Specimen: STOOL  Result Value Ref Range Status   C Diff antigen NEGATIVE NEGATIVE Final   C Diff toxin NEGATIVE NEGATIVE Final   C Diff interpretation No C. difficile detected.  Final    Comment: Performed at [facility], [place] [address], [place], [place] [address]     Scheduled Meds: . aspirin EC  81 mg Oral Daily  . atorvastatin  40 mg Oral Daily  . Chlorhexidine Gluconate Cloth  6 each Topical Daily  . DULoxetine  60 mg Oral Daily  . folic acid  1 mg Oral Daily  . heparin  5,000 Units Subcutaneous Q8H  . insulin aspart  0-20 Units Subcutaneous TID WC  . insulin aspart  0-5 Units Subcutaneous QHS  . insulin aspart  12 Units Subcutaneous TID WC  . insulin glargine  58 Units Subcutaneous QHS  . losartan  50 mg Oral Daily  . mirtazapine  15 mg Oral QHS  . multivitamin with minerals  1 tablet Oral Daily  . nicotine  7 mg Transdermal Daily  . pantoprazole  40 mg Oral Daily  . saccharomyces boulardii  250 mg Oral BID   Continuous Infusions: . sodium chloride 10 mL/hr at 11/17/20 2200  . ceFEPime (MAXIPIME) IV 2 g (11/24/20 0246)  . vancomycin 1,500 mg (11/23/20 2352)     LOS: 11 days    Cherene Altes, MD [facility] Office  [phone] Pager - Text Page per Amion  If 7PM-7AM, please contact night-coverage per Amion 11/24/2020, 9:33 AM

## 2020-11-24 NOTE — Progress Notes (Signed)
Inpatient Diabetes Program Recommendations  AACE/ADA: New Consensus Statement on Inpatient Glycemic Control (2015)  Target Ranges:  Prepandial:   less than 140 mg/dL      Peak postprandial:   less than 180 mg/dL (1-2 hours)      Critically ill patients:  140 - 180 mg/dL   Lab Results  Component Value Date   GLUCAP 388 (H) 11/24/2020   HGBA1C 11.2 (H) 10/28/2020    Review of Glycemic Control Results for [name], [name] "HARVEY" (MRN [id number]) as of 11/24/2020 09:03  Ref. Range 11/23/2020 06:18 11/23/2020 11:11 11/23/2020 16:07 11/23/2020 19:46 11/24/2020 06:43  Glucose-Capillary Latest Ref Range: 70 - 99 mg/dL 228 (H) 253 (H) 285 (H) 338 (H) 388 (H)   Diabetes history: DM Outpatient Diabetes medications: Invokana 300 mg Daily, Glipizide 5 mg Daily, Lantus 20 units qhs Current orders for Inpatient glycemic control:  Lantus 58 units Novolog 0-20 units tid + hs Novolog 12 units tid meal coverage  Consult: patient intereseted in a continuos CBG monitor if possible  Inpatient Diabetes Program Recommendations:    -  Consider lantus 35 units bid (for possible better absorption)  Note pt d/cing to possible SNF, CGM's not used in facilities. Also note no insurance for pt.  CGM's are at least $75/month for sensors alone for the [facility]. We do not have samples to give pt.  If pt would like to pay out of pocket the order numbers are:  [facility] Libre 14 Day Sensor Misc order # [id number] [place] Night 14 Day Reader Device order # [id number]  Thanks,  Tama Headings RN, MSN, BC-ADM Inpatient Diabetes Coordinator Team Pager [phone] (8a-5p)

## 2020-11-24 NOTE — TOC Progression Note (Signed)
Transition of Care [facility]) - Progression Note    Patient Details  Name: [name] [name] MRN: [id number] Date of Birth: 10-Jul-1961  Transition of Care [facility]) CM/SW [place], [place] Phone Number: 11/24/2020, 9:59 AM  Clinical Narrative:     CSW called [place] to follow up on DC plan for patient.[place] has accepted the LOG, but they have to be able to maintain his place at the boardinghouse to have to DC to afterwards. CSW left voicemail and awaiting callback. CSW awaiting on response from financial counseling .  CSW confirmed dc plan with patient. Patient confirmed that plan is to dc to [facility]. From there patient will go to the Boardinghouse.Pt has  been working with financial counseling, but needs additional resources as he cannot afford it at this time. Pt states he pays 120 a week.SW will continue to follow for DC planning.     Expected Discharge Plan: [place] Barriers to Discharge: Continued Medical Work up  Expected Discharge Plan and Services Expected Discharge Plan: [place] In-house Referral: Clinical Social Work     Living arrangements for the past 2 months: [place] Determinants of Health (SDOH) Interventions    Readmission Risk Interventions Readmission Risk Prevention Plan 11/14/2020  Transportation Screening Complete  HRI or [place] Complete  Social Work Consult for [place] Planning/Counseling Complete  Palliative Care Screening Not Applicable  Medication Review Press photographer) Complete  Some recent data might be hidden

## 2020-11-25 LAB — GLUCOSE, CAPILLARY
Glucose-Capillary: 172 mg/dL — ABNORMAL HIGH (ref 70–99)
Glucose-Capillary: 259 mg/dL — ABNORMAL HIGH (ref 70–99)
Glucose-Capillary: 242 mg/dL — ABNORMAL HIGH (ref 70–99)
Glucose-Capillary: 248 mg/dL — ABNORMAL HIGH (ref 70–99)

## 2020-11-25 LAB — BASIC METABOLIC PANEL
Anion gap: 4 — ABNORMAL LOW (ref 5–15)
BUN: 7 mg/dL (ref 6–20)
CO2: 25 mmol/L (ref 22–32)
Calcium: 9.4 mg/dL (ref 8.9–10.3)
Chloride: 106 mmol/L (ref 98–111)
Creatinine, Ser: 0.51 mg/dL — ABNORMAL LOW (ref 0.61–1.24)
GFR, Estimated: >60 mL/min (ref >60)
Glucose, Bld: 309 mg/dL — ABNORMAL HIGH (ref 70–99)
Potassium: 4.3 mmol/L (ref 3.5–5.1)
Sodium: 135 mmol/L (ref 135–145)

## 2020-11-25 LAB — VANCOMYCIN, PEAK: Vancomycin Pk: 26 ug/mL — ABNORMAL LOW (ref 30–40)

## 2020-11-25 LAB — VANCOMYCIN, TROUGH: Vancomycin Tr: 12 ug/mL — ABNORMAL LOW (ref 15–20)

## 2020-11-25 MED ORDER — INSULIN GLARGINE 100 UNIT/ML ~~LOC~~ SOLN
38.0000 [IU] | Freq: Two times a day (BID) | SUBCUTANEOUS | Status: DC
  Administered 2020-11-25 – 2020-11-26 (×2): 38 [IU] via SUBCUTANEOUS
  Filled 2020-11-25 (×4): qty 0.38

## 2020-11-25 MED ORDER — LOPERAMIDE HCL 2 MG PO CAPS
2.0000 mg | ORAL_CAPSULE | Freq: Three times a day (TID) | ORAL | Status: DC
  Administered 2020-11-25 – 2020-11-26 (×2): 2 mg via ORAL
  Filled 2020-11-25 (×2): qty 1

## 2020-11-25 NOTE — Progress Notes (Signed)
[name] [name]  QAS:[id number] DOB: 1961-07-04 DOA: 11/13/2020 PCP: [name], NP    Brief Narrative:  [phone] with a history of DM2, HTN, chronic hepatitis C, and GERD who presented to the ER with a worsening left foot wound.  Following his admission empiric vancomycin and cefepime were initiated.  MRI confirmed osteomyelitis and abscess of the affected foot.  Orthopedic surgery was consulted and took the patient to the OR for a transmetatarsal amputation 3/18.  Antimicrobials:  Cefepime 3/17 > Vancomycin 3/17 > Flagyl 3/17 >319  DVT prophylaxis: Subcu heparin  Consultants:  none  Subjective: Clinically stable awaiting safe disposition.  Afebrile.  Vital signs stable.  CBG control improving.  Having intermittent watery stools but no fever or abdominal cramps.  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 - being treated with vancomycin and cefepime - intraoperative culture positive for Pseudomonas and Staph epi - ID consulted and directed antibiotic therapy - PICC line placed 3/22 with plan for 6 weeks of antibiotics -to follow-up with ID in the outpatient setting -wound improving per Orthopedic service with plan to follow-up in the office in 1 week  DM2 uncontrolled with hyperglycemia and diabetic foot wound On Invokana and glipizide as outpatient - A1c 11.22/22 -Lantus started as inpatient -CBGs not yet at goal- adjust tx again today -educated patient that he will need to continue insulin dosing post discharge, certainly at least while he is at the rehab facility and possibly long-term depending upon his CBGs as he continues to improve - not a candidate for continuous CBG monitor due to cost/lack of insurance   Hyponatremia Some element of pseudohyponatremia related to hyperglycemia, and also likely related to cirrhosis -corrected with improvement in CBGs  HTN Blood pressure  currently reasonably controlled  History of alcohol abuse and cirrhosis of the liver Denies current alcohol use - MRI abdomen 2018 suggested early cirrhosis -has been counseled on absolute abstinence from alcohol  Chronic anemia Hemoglobin has been stable  HLD Continue Lipitor  Depression Continue usual Cymbalta and Remeron  GERD Continue PPI  Difficult disposition Patient does not have a suitable environment in which to be discharged to allow for proper wound care and IV abx - hopeful for SNF placement 3/29   Code Status: NO CODE BLUE Family Communication: Spoke at length with mother at bedside Status is: Inpatient  Remains inpatient appropriate because:Unsafe d/c plan   Dispo: The patient is from: Home              Anticipated d/c is to: SNF              Patient currently is medically stable to d/c.   Difficult to place patient No    Objective: Blood pressure 122/65, pulse 69, temperature 98.5 F (36.9 C), temperature source Oral, resp. rate 19, height 5\' 11"  (1.803 m), weight 74.5 kg, SpO2 98 %.  Intake/Output Summary (Last 24 hours) at 11/25/2020 0926 Last data filed at 11/25/2020 0000 Gross per 24 hour  Intake 830 ml  Output 1325 ml  Net -495 ml   Filed Weights   11/13/20 1546 11/14/20 0057 11/15/20 0842  Weight: 74.4 kg 74.5 kg 74.5 kg    Examination: General: No acute respiratory distress Cardiovascular: RRR   CBC: Recent Labs  Lab 11/21/20 0345  WBC 10.5  HGB 12.3*  HCT 37.7*  MCV 90.8  PLT 211   Basic Metabolic Panel: Recent Labs  Lab 11/21/20 0345  NA 133*  K 3.9  CL 101  CO2 28  GLUCOSE 204*  BUN 7  CREATININE 0.47*  CALCIUM 8.9  MG 1.9   GFR: Estimated Creatinine Clearance: 106.1 mL/min (A) (by C-G formula based on SCr of 0.47 mg/dL (L)).  Liver Function Tests: No results for input(s): AST, ALT, ALKPHOS, BILITOT, PROT, ALBUMIN in the last 168 hours.  Coagulation Profile: No results for input(s): INR, PROTIME in the last  168 hours.  HbA1C: Hgb A1c MFr Bld  Date/Time Value Ref Range Status  10/28/2020 10:14 AM 11.2 (H) 4.8 - 5.6 % Final    Comment:    (NOTE) Pre diabetes:          5.7%-6.4%  Diabetes:              >6.4%  Glycemic control for   <7.0% adults with diabetes   09/23/2020 10:37 AM 12.1 (H) 4.8 - 5.6 % Final    Comment:             Prediabetes: 5.7 - 6.4          Diabetes: >6.4          Glycemic control for adults with diabetes: <7.0     CBG: Recent Labs  Lab 11/24/20 0643 11/24/20 1154 11/24/20 1656 11/24/20 1954 11/25/20 0704  GLUCAP 388* 181* 167* 272* 172*    Recent Results (from the past 240 hour(s))  C Difficile Quick Screen w PCR reflex     Status: None   Collection Time: 11/16/20  1:17 AM   Specimen: STOOL  Result Value Ref Range Status   C Diff antigen NEGATIVE NEGATIVE Final   C Diff toxin NEGATIVE NEGATIVE Final   C Diff interpretation No C. difficile detected.  Final    Comment: Performed at [facility], [place] [address], [place], [place] [address]     Scheduled Meds: . aspirin EC  81 mg Oral Daily  . atorvastatin  40 mg Oral Daily  . Chlorhexidine Gluconate Cloth  6 each Topical Daily  . DULoxetine  60 mg Oral Daily  . folic acid  1 mg Oral Daily  . heparin  5,000 Units Subcutaneous Q8H  . insulin aspart  0-20 Units Subcutaneous TID WC  . insulin aspart  0-5 Units Subcutaneous QHS  . insulin aspart  8 Units Subcutaneous TID WC  . insulin glargine  34 Units Subcutaneous BID  . losartan  50 mg Oral Daily  . mirtazapine  15 mg Oral QHS  . multivitamin with minerals  1 tablet Oral Daily  . nicotine  7 mg Transdermal Daily  . pantoprazole  40 mg Oral Daily  . saccharomyces boulardii  250 mg Oral BID   Continuous Infusions: . ceFEPime (MAXIPIME) IV 2 g (11/25/20 0832)  . vancomycin 1,500 mg (11/25/20 0056)     LOS: 12 days   Cherene Altes, MD [facility] Office  [phone] Pager - Text Page per Amion  If 7PM-7AM, please  contact night-coverage per Amion 11/25/2020, 9:26 AM

## 2020-11-25 NOTE — Progress Notes (Addendum)
CSW spoke with [facility] staff to obtain updates regarding patient's discharge plan - staff reports patient can return to his boarding house after discharge from [facility]. Patient was working jobs for income prior to his toe surgery - patient will be on IV antibiotics until 4/29 and the plan is for him to receive them at [facility].   CSW left a voicemail with Jenny Reichmann from financial counseling requesting a return call on patient's behalf, as he has questions regarding his Medicaid application.  CSW will speak with patient regarding his ability to pay for his boarding house placement IV antibiotics are completed at the facility.  Madilyn Fireman, MSW, LCSW Transitions of Care  Clinical Social Worker II [phone]

## 2020-11-26 ENCOUNTER — Inpatient Hospital Stay
Admission: RE | Admit: 2020-11-26 | Discharge: 2020-12-06 | Disposition: A | Discharge status: Home / Self Care | Payer: Self-pay | Source: Ambulatory Visit | Attending: Internal Medicine | Admitting: Internal Medicine

## 2020-11-26 DIAGNOSIS — K7460 Unspecified cirrhosis of liver: Secondary | ICD-10-CM

## 2020-11-26 DIAGNOSIS — R739 Hyperglycemia, unspecified: Secondary | ICD-10-CM

## 2020-11-26 LAB — VANCOMYCIN, TROUGH: Vancomycin Tr: 12 ug/mL — ABNORMAL LOW (ref 15–20)

## 2020-11-26 LAB — GLUCOSE, CAPILLARY: Glucose-Capillary: 194 mg/dL — ABNORMAL HIGH (ref 70–99)

## 2020-11-26 MED ORDER — NICOTINE 7 MG/24HR TD PT24: 7.0000 mg | MEDICATED_PATCH | Freq: Every day | TRANSDERMAL | 0 refills | Status: DC

## 2020-11-26 MED ORDER — VANCOMYCIN IV (FOR PTA / DISCHARGE USE ONLY): 1500.0000 mg | Freq: Two times a day (BID) | INTRAVENOUS | 0 refills | Status: DC

## 2020-11-26 MED ORDER — INSULIN ASPART 100 UNIT/ML ~~LOC~~ SOLN: 8.0000 [IU] | Freq: Three times a day (TID) | SUBCUTANEOUS | 11 refills | Status: DC

## 2020-11-26 MED ORDER — CEFEPIME IV (FOR PTA / DISCHARGE USE ONLY): 2.0000 g | Freq: Three times a day (TID) | INTRAVENOUS | 0 refills | Status: DC

## 2020-11-26 MED ORDER — INSULIN GLARGINE 100 UNIT/ML ~~LOC~~ SOLN: 38.0000 [IU] | Freq: Two times a day (BID) | SUBCUTANEOUS | 11 refills | Status: DC

## 2020-11-26 MED ORDER — SACCHAROMYCES BOULARDII 250 MG PO CAPS: 250.0000 mg | ORAL_CAPSULE | Freq: Two times a day (BID) | ORAL | Status: DC

## 2020-11-26 MED ORDER — OXYCODONE HCL 5 MG PO TABS: 5.0000 mg | ORAL_TABLET | ORAL | 0 refills | Status: DC | PRN

## 2020-11-26 MED ORDER — INSULIN ASPART 100 UNIT/ML ~~LOC~~ SOLN: 0.0000 [IU] | Freq: Three times a day (TID) | SUBCUTANEOUS | 11 refills | Status: DC

## 2020-11-26 MED ORDER — HEPARIN SOD (PORK) LOCK FLUSH 100 UNIT/ML IV SOLN
250.0000 [IU] | INTRAVENOUS | Status: AC | PRN
  Administered 2020-11-26: 250 [IU]
  Filled 2020-11-26: qty 2.5

## 2020-11-26 MED ORDER — ACETAMINOPHEN 325 MG PO TABS: 650.0000 mg | ORAL_TABLET | Freq: Four times a day (QID) | ORAL | Status: DC | PRN

## 2020-11-26 MED ORDER — INSULIN ASPART 100 UNIT/ML ~~LOC~~ SOLN: 0.0000 [IU] | Freq: Every day | SUBCUTANEOUS | 11 refills | Status: DC

## 2020-11-26 NOTE — Progress Notes (Signed)
Late entry from 11/25/20 @1611   11/25/20 1611  PT Visit Information  Last PT Received On 11/25/20  Assistance Needed +1  Reason Eval/Treat Not Completed Patient declined, no reason specified;Medical issues which prohibited therapy (Pt reports multiple bouts of diarrhea today x 7 bouts and wished to remain in bed as he has been making multiple trips to commode.  Will continue efforts per POC.)  History of Present Illness Patient is a 59 y/o male presented to ED from wound care center for concerns of cellulitis of L great toe with ulceration and uncontrolled diabetes. Patient now s/p L transmetatarsal amputation on 3/18. PMH: depression, uncontrolled DM, GERD, hep C, neuropathy  Keyundra Fant R. , PTA Acute Rehabilitation Services Pager [phone] Office [phone]

## 2020-11-26 NOTE — Discharge Instructions (Signed)
Osteomyelitis, Adult  Bone infections, also called osteomyelitis,occur when bacteria or other germs get inside a bone. This can happen if you have an infection in another part of your body that spreads through your blood. It can also happen if you have a wound or a broken bone (fracture) that breaks the skin. A wound or a fracture can allow germs from your skin or from outside of your body to spread to your bone. Bone infections need to be treated quickly to:  Prevent bone damage.  Prevent the infection from spreading to other areas of your body. What are the causes? Most bone infections are caused by bacteria. The most common bacteria is one found on the skin (staphylococcus). Bone infections can also be caused by other germs, such as viruses and funguses. What increases the risk? You are more likely to develop this condition if:  You recently had surgery, especially bone or joint surgery.  You have had an injury, such as stepping on a nail or having a fracture that exposes bones through the skin.  You have a long-term (chronic) disease, such as: ? Diabetes. ? HIV (human immunodeficiency virus). ? Rheumatoid arthritis. ? Sickle cell anemia. ? Kidney disease that requires dialysis.  You have a condition that affects your body's defense system (immune system) or you take medicines that block or weaken the immune system.  You have a condition that reduces your blood flow.  You have an artificial joint.  You have had a joint or bone repaired with plates or screws.  You use IV drugs. What are the signs or symptoms? Symptoms vary depending on the type and location of your infection. Common symptoms of bone infections include:  Fever and chills.  Skin redness and warmth.  Swelling.  Pain and stiffness.  Drainage of fluid or pus near the infection. How is this diagnosed? This condition may be diagnosed based on:  Your symptoms and medical history.  A physical  exam.  Tests, such as: ? A sample of tissue, fluid, or blood taken to be examined under a microscope. ? Pus or discharge swabbed from a wound for testing. This is to identify the type of germs and to determine what type of medicine will kill them. ? Blood tests.  Imaging studies, including X-rays, MRI, CT scan, bone scan, or ultrasound. How is this treated? Treatment for this condition depends on the cause and type of infection. Antibiotic medicines are usually the first treatment for a bone infection. This may be done in a hospital at first. You may have to continue IV antibiotics at home or take antibiotics by mouth for several weeks after that. Other treatments may include surgery to:  Remove dead or dying tissue from a bone.  Remove an infected artificial joint.  Remove infected plates or screws that were used to repair a broken bone. Follow these instructions at home: Medicines  Take over-the-counter and prescription medicines as told by your health care provider. Finish all antibiotic medicine even if you start to feel better.  Follow instructions from your health care provider about how to take IV antibiotics at home. You may need to have a nurse come to your home to give you the IV antibiotics. Managing pain, stiffness, and swelling If directed, put ice on the affected area. To do this:  Put ice in a plastic bag.  Place a towel between your skin and the bag.  Leave the ice on for 20 minutes, 2-3 times a day.   General   instructions  Ask your health care provider if you have any restrictions on your activities.  Do not use any products that contain nicotine or tobacco, such as cigarettes, e-cigarettes, and chewing tobacco. If you need help quitting, ask your health care provider.  Keep all follow-up visits as told by your health care provider. This is important. How is this prevented?  Wash your hands often to stop the spread of germs. Wash your hands for at least 20  seconds with soap and water. If soap and water are not available, use hand sanitizer.  Keep any open areas, cuts, or wounds clean. Apply a clean bandage after cleaning the area.  Check wounds frequently for signs of infections. Signs of infection include redness, swelling, warmth, pus, or a bad smell.  Wear proper footwear to avoid injuries to the feet. Contact a health care provider if:  You develop a fever or chills.  You have redness, warmth, pain, or swelling that returns after treatment. Get help right away if:  You have rapid breathing or you have trouble breathing.  You have chest pain.  You cannot drink fluids or make urine.  The affected area swells, changes color, or turns blue.  You have numbness or severe pain in the affected area. These symptoms may represent a serious problem that is an emergency. Do not wait to see if the symptoms will go away. Get medical help right away. Call your local emergency services (911 in the [place] Do not drive yourself to the hospital. Summary  Bone infections, also called osteomyelitis,occur when bacteria or other germs get inside a bone.  You are more likely to get this type of infection if you have a condition that lowers your ability to fight infections. You are also likely to get this condition if you take medicines that block or weaken the immune system.  Most bone infections are caused by bacteria. They can also be caused by other germs, such as viruses and funguses.  Treatment for this condition usually starts with taking antibiotics. Further treatment depends on the cause and type of infection. This information is not intended to replace advice given to you by your health care provider. Make sure you discuss any questions you have with your health care provider. Document Revised: 11/02/2019 Document Reviewed: 11/02/2019 Elsevier Patient Education  2021 [employer]  

## 2020-11-26 NOTE — Progress Notes (Signed)
Pharmacy Antibiotic Note  [name] [name] is a 60 y.o. male with Staph epi and pseudomonas LLE osteomyelitis. Pharmacy has been consulted for vancomycin and cefepime dosing.  MRI on 3/17 revealed osteomyelitis and patient had left transmetatarsal amputation 3/18. Plan to continue antibiotic therapy for 6 weeks per ID, end 4/29.  3/29 AM update:  Vancomycin AUC is therapeutic at 481 Renal function stable  Plan: Cont vancomycin 1500 mg IV q12h Cefepime 2 g IV every 8 hours F/u weekly vanc levels, renal function   Height: 5\' 11"  (180.3 cm) Weight: 74.5 kg (164 lb 3.9 oz) IBW/kg (Calculated) : 75.3  Temp (24hrs), Avg:98.2 F (36.8 C), Min:97.6 F (36.4 C), Max:98.5 F (36.9 C)  Recent Labs  Lab 11/19/20 1936 11/21/20 0345 11/25/20 1131 11/25/20 1830 11/26/20 0156  WBC  --  10.5  --   --   --   CREATININE  --  0.47* 0.51*  --   --   VANCOTROUGH  --   --  12*  --  12*  VANCOPEAK  --   --   --  26*  --   VANCORANDOM 15  --   --   --   --     Estimated Creatinine Clearance: 106.1 mL/min (A) (by C-G formula based on SCr of 0.51 mg/dL (L)).    Allergies  Allergen Reactions  . Bee Venom Anaphylaxis  . Lactose Intolerance (Gi) Other (See Comments)    GI upset    Antimicrobials this admission: 3/16 Zosyn x1 3/16 Vancomycin >>  3/17 Cefepime >>  3/17 Metronidazole >> 3/19  Microbiology results: 2/28 Deep surgical wound (foot): Klebsiella oxytoca- sensitive except ampicillin/unasyn 3/17 MRSA PCR: negative 3/18 L foot tissue: PSA (S-ceftaz, cipro), staph epi (S-cipro, vanc) 3/19 C.Diff: negative x2   Narda Bonds, PharmD, BCPS Clinical Pharmacist Phone: [phone]

## 2020-11-26 NOTE — Progress Notes (Addendum)
CSW spoke with patient at bedside - patient reports he will be able to pay the boarding house to secure his bed while he is at [facility]. 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 CSW informed patient he will be discharging today.  CSW notified Dr. Thereasa Solo of information.  CSW spoke with Marianna Fuss at [facility] to confirm the facility can receive the patient today.   CSW completed discharge packet and asked MD to sign DNR form. [facility] has been called for pickup at 10:30am. The number to call for report is [phone].   Madilyn Fireman, MSW, LCSW Transitions of Care  Clinical Social Worker II [phone]

## 2020-11-26 NOTE — Discharge Summary (Addendum)
DISCHARGE SUMMARY  JOSEPHANTHONY TINDEL  MR#: [id number]  DOB:1961/05/23  Date of Admission: 11/13/2020 Date of Discharge: 11/26/2020  Attending Physician:Jennfer Gassen Hennie Duos, MD  Patient's SMO:LMBEMLJ, Vernia Buff, NP  Consults: Orthopedics - Dr. Sharol Given ID  Disposition: D/C to SNF for IV abx, rehab, and wound care    Follow-up Appts:  Follow-up Information    Persons, Bevely Palmer, [place] In 1 week.   Specialty: Orthopedic Surgery Contact information: [place] [address] [address] [place] Follow up on 12/25/2020.   Why: 2:10 pm with Freeman Caldron PA Contact information: [place] [id number] [place], Lavell Islam, MD Follow up on 11/28/2020.   Specialty: Infectious Diseases Why: 10:00AM Contact information: [address] [place] [address] [phone]               Tests Needing Follow-up: -assess surgical wound site -assess CBG control  -routine PICC line care/monitoring  -PT/OT mobilization  -monitor status of fatty liver/early cirrhosis/liver function   Wound Care: Daily dressing change with 4 x 4's and Ace wrap.  May cleanse with antibacterial soap and water.  Antimicrobials:  Cefepime 3/17 > Vancomycin 3/17 > Flagyl 3/17 >319  Discharge Diagnoses: Diabetic foot infection - left leg cellulitis and left foot osteomyelitis DM2 uncontrolled with hyperglycemia and diabetic foot wound Hyponatremia HTN History of alcohol abuse and fatty liver / early cirrhosis of the liver Chronic anemia HLD Depression GERD  Initial presentation: 60yo with a history of DM2, HTN, chronic hepatitis C, and GERD who presented to the ER with a worsening left foot wound.  Hospital Course: Following his admission empiric vancomycin and cefepime were initiated.  MRI confirmed osteomyelitis and abscess of the affected foot.  Orthopedic surgery was consulted  and took the patient to the OR for a transmetatarsal amputation 3/18.  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 - being treated with vancomycin and cefepime - intraoperative culture positive for Pseudomonas and Staph epi - ID consulted and directed antibiotic therapy - PICC line placed 3/22 with plan for 6 weeks of antibiotics -to follow-up with ID in the outpatient setting -wound improving per Orthopedic service with plan to follow-up in the office in 1 week  DM2 uncontrolled with hyperglycemia and diabetic foot wound On Invokana and glipizide as outpatient - A1c 11.2 2/22 -Lantus started as inpatient -CBGs improving at time of d/c, but further titration likely to be needed - educated patient that he will need to continue insulin dosing post discharge, certainly at least while he is at the rehab facility and possibly long-term depending upon his CBGs as he continues to improve - not a candidate for continuous CBG monitor due to cost/lack of insurance   Hyponatremia Some element of pseudohyponatremia related to hyperglycemia, and also likely related to cirrhosis - corrected with improvement in CBGs and volume expansion   HTN Blood pressure currently reasonably well controlled  History of alcohol abuse and fatty liver/early cirrhosis Denies current alcohol use - MRI abdomen 2018 suggested early cirrhosis - has been counseled on absolute abstinence from alcohol   Chronic anemia Hemoglobin has been stable during the postoperative period   HLD Continue Lipitor  Depression Continue usual Cymbalta and Remeron  GERD Continue PPI   Allergies as of 11/26/2020      Reactions   Bee Venom Anaphylaxis  Lactose Intolerance (gi) Other (See Comments)   GI upset      Medication List    STOP taking these medications   amoxicillin-clavulanate 875-125 MG tablet Commonly known as: Augmentin   canagliflozin 300  MG Tabs tablet Commonly known as: Invokana   EPINEPHrine 0.3 mg/0.3 mL Soaj injection Commonly known as: EPI-PEN   glipiZIDE 5 MG tablet Commonly known as: GLUCOTROL   insulin glargine 100 UNIT/ML Solostar Pen Commonly known as: LANTUS Replaced by: insulin glargine 100 UNIT/ML injection   loratadine 10 MG tablet Commonly known as: CLARITIN   mupirocin ointment 2 % Commonly known as: BACTROBAN   nicotine 21 mg/24hr patch Commonly known as: NICODERM CQ - dosed in mg/24 hours Replaced by: nicotine 7 mg/24hr patch   sulfamethoxazole-trimethoprim 800-160 MG tablet Commonly known as: BACTRIM DS   TRUEplus 5-Bevel Pen Needles 32G X 4 MM Misc Generic drug: Insulin Pen Needle     TAKE these medications   acetaminophen 325 MG tablet Commonly known as: TYLENOL Take 2 tablets (650 mg total) by mouth every 6 (six) hours as needed for mild pain (or Fever >/= 101).   albuterol 108 (90 Base) MCG/ACT inhaler Commonly known as: Ventolin HFA Inhale 2 puffs into the lungs every 6 (six) hours as needed for wheezing or shortness of breath.   aspirin EC 81 MG tablet Take 1 tablet (81 mg total) by mouth daily.   atorvastatin 40 MG tablet Commonly known as: LIPITOR Take 1 tablet (40 mg total) by mouth daily. Please fill as a 90 day supply.   ceFEPime  IVPB Commonly known as: MAXIPIME Inject 2 g into the vein every 8 (eight) hours. Indication: Osteomyelitis  First Dose: No Last Day of Therapy:  12/27/2020 Labs - Once weekly:  CBC/D and BMP, Labs - Every other week:  ESR and CRP Method of administration: IV Push Method of administration may be changed at the discretion of home infusion pharmacist based upon assessment of the patient and/or caregiver's ability to self-administer the medication ordered.   DULoxetine 60 MG capsule Commonly known as: CYMBALTA Take 1 capsule (60 mg total) by mouth daily. Please fill as a 90 day supply What changed: additional instructions   folic acid 1  MG tablet Commonly known as: FOLVITE Take 1 tablet (1 mg total) by mouth daily.   insulin aspart 100 UNIT/ML injection Commonly known as: novoLOG Inject 0-20 Units into the skin 3 (three) times daily with meals.   insulin aspart 100 UNIT/ML injection Commonly known as: novoLOG Inject 0-5 Units into the skin at bedtime.   insulin aspart 100 UNIT/ML injection Commonly known as: novoLOG Inject 8 Units into the skin 3 (three) times daily with meals.   insulin glargine 100 UNIT/ML injection Commonly known as: LANTUS Inject 0.38 mLs (38 Units total) into the skin 2 (two) times daily. Replaces: insulin glargine 100 UNIT/ML Solostar Pen   loperamide 2 MG tablet Commonly known as: Imodium A-D Take 1 tablet (2 mg total) by mouth 4 (four) times daily as needed for diarrhea or loose stools.   losartan 50 MG tablet Commonly known as: COZAAR Take 1 tablet (50 mg total) by mouth daily. Please fill as a 90 day supply What changed: additional instructions   mirtazapine 15 MG tablet Commonly known as: REMERON Take 1 tablet (15 mg total) by mouth at bedtime. Please fill as a 90 day supply What changed: additional instructions   Multivitamin Adult Tabs Take 1 tablet by mouth daily.   nicotine 7  mg/24hr patch Commonly known as: NICODERM CQ - dosed in mg/24 hr Place 1 patch (7 mg total) onto the skin daily. Replaces: nicotine 21 mg/24hr patch   omeprazole 40 MG capsule Commonly known as: PRILOSEC Take 1 capsule (40 mg total) by mouth daily. Please fill as a 90 day supply What changed: additional instructions   oxyCODONE 5 MG immediate release tablet Commonly known as: Oxy IR/ROXICODONE Take 1-2 tablets (5-10 mg total) by mouth every 4 (four) hours as needed for severe pain.   saccharomyces boulardii 250 MG capsule Commonly known as: FLORASTOR Take 1 capsule (250 mg total) by mouth 2 (two) times daily.   vancomycin  IVPB Inject 1,500 mg into the vein every 12 (twelve) hours.  Indication:  Osteomyelitis  First Dose: No Last Day of Therapy:  12/27/2020 Labs - "Sunday/Monday:  CBC/D, BMP, and vancomycin trough. Labs - Thursday:  BMP and vancomycin trough Labs - Every other week:  ESR and CRP Method of administration:Elastomeric Method of administration may be changed at the discretion of the patient and/or caregiver's ability to self-administer the medication ordered.            Discharge Care Instructions  (From admission, onward)         Start     Ordered   11/26/20 0000  Change dressing on IV access line weekly and PRN  (Home infusion instructions - [facility] )        03" /29/22 0817          Day of Discharge BP 134/73   Pulse 78   Temp 98.2 F (36.8 C) (Oral)   Resp 17   Ht '5\' 11"'  (1.803 m)   Wt 74.5 kg   SpO2 99%   BMI 22.91 kg/m    Basic Metabolic Panel: Recent Labs  Lab 11/21/20 0345 11/25/20 1131  NA 133* 135  K 3.9 4.3  CL 101 106  CO2 28 25  GLUCOSE 204* 309*  BUN 7 7  CREATININE 0.47* 0.51*  CALCIUM 8.9 9.4  MG 1.9  --     CBC: Recent Labs  Lab 11/21/20 0345  WBC 10.5  HGB 12.3*  HCT 37.7*  MCV 90.8  PLT 306    CBG: Recent Labs  Lab 11/25/20 0704 11/25/20 1150 11/25/20 1729 11/25/20 2022 11/26/20 0643  GLUCAP 172* 259* 242* 248* 194*     Time spent in discharge (includes decision making & examination of pt): 35 minutes  11/26/2020, 8:19 AM   Cherene Altes, MD [facility] Office  [phone]

## 2020-11-27 ENCOUNTER — Other Ambulatory Visit (HOSPITAL_COMMUNITY)
Admission: RE | Admit: 2020-11-27 | Discharge: 2020-11-27 | Disposition: A | Discharge status: Home / Self Care | Payer: MEDICAID | Source: Skilled Nursing Facility | Attending: Internal Medicine | Admitting: Internal Medicine

## 2020-11-27 ENCOUNTER — Encounter: Payer: Self-pay | Admitting: Adult Health

## 2020-11-27 ENCOUNTER — Non-Acute Institutional Stay (SKILLED_NURSING_FACILITY): Payer: Self-pay | Admitting: Adult Health

## 2020-11-27 DIAGNOSIS — I700 Atherosclerosis of aorta: Secondary | ICD-10-CM

## 2020-11-27 DIAGNOSIS — E1159 Type 2 diabetes mellitus with other circulatory complications: Secondary | ICD-10-CM

## 2020-11-27 DIAGNOSIS — E1141 Type 2 diabetes mellitus with diabetic mononeuropathy: Secondary | ICD-10-CM

## 2020-11-27 DIAGNOSIS — K219 Gastro-esophageal reflux disease without esophagitis: Secondary | ICD-10-CM

## 2020-11-27 DIAGNOSIS — F339 Major depressive disorder, recurrent, unspecified: Secondary | ICD-10-CM

## 2020-11-27 DIAGNOSIS — M86272 Subacute osteomyelitis, left ankle and foot: Secondary | ICD-10-CM

## 2020-11-27 DIAGNOSIS — E1169 Type 2 diabetes mellitus with other specified complication: Secondary | ICD-10-CM

## 2020-11-27 DIAGNOSIS — E785 Hyperlipidemia, unspecified: Secondary | ICD-10-CM

## 2020-11-27 DIAGNOSIS — A419 Sepsis, unspecified organism: Secondary | ICD-10-CM

## 2020-11-27 DIAGNOSIS — Z89432 Acquired absence of left foot: Secondary | ICD-10-CM

## 2020-11-27 DIAGNOSIS — F17200 Nicotine dependence, unspecified, uncomplicated: Secondary | ICD-10-CM

## 2020-11-27 DIAGNOSIS — Z794 Long term (current) use of insulin: Secondary | ICD-10-CM

## 2020-11-27 DIAGNOSIS — I152 Hypertension secondary to endocrine disorders: Secondary | ICD-10-CM

## 2020-11-27 DIAGNOSIS — E1140 Type 2 diabetes mellitus with diabetic neuropathy, unspecified: Secondary | ICD-10-CM

## 2020-11-27 DIAGNOSIS — B182 Chronic viral hepatitis C: Secondary | ICD-10-CM

## 2020-11-27 DIAGNOSIS — K7030 Alcoholic cirrhosis of liver without ascites: Secondary | ICD-10-CM

## 2020-11-27 LAB — BASIC METABOLIC PANEL
Anion gap: 6 (ref 5–15)
BUN: 8 mg/dL (ref 6–20)
CO2: 22 mmol/L (ref 22–32)
Calcium: 9.2 mg/dL (ref 8.9–10.3)
Chloride: 102 mmol/L (ref 98–111)
Creatinine, Ser: 0.49 mg/dL — ABNORMAL LOW (ref 0.61–1.24)
GFR, Estimated: >60 mL/min (ref >60)
Glucose, Bld: 277 mg/dL — ABNORMAL HIGH (ref 70–99)
Potassium: 4.3 mmol/L (ref 3.5–5.1)
Sodium: 130 mmol/L — ABNORMAL LOW (ref 135–145)

## 2020-11-27 LAB — VANCOMYCIN, TROUGH: Vancomycin Tr: 8 ug/mL — ABNORMAL LOW (ref 15–20)

## 2020-11-27 NOTE — Progress Notes (Signed)
Location:  [facility] Room Number: 139/W Place of Service:  SNF (31)   CODE STATUS: DNR  Allergies  Allergen Reactions  . Bee Venom Anaphylaxis  . Lactose Intolerance (Gi) Other (See Comments)    GI upset    Chief Complaint  Patient presents with  . Hospitalization Follow-up    Hospitalization Follow Up     HPI:  He is a 60 year old man who has been hospitalized from 11-13-20 through 11-26-20. His medical history includes: diabetes; hyperlipidemia; chronic hepatitis C. He presented to the ED with a worsening left foot ulceration. He normally lives in a boarding house. He was started empirically on vancomycin and cefepime. His MRI confirmed osteomyelitis and abscess of left foot. He had ortho consult and was taken to the ED on 11-15-20 for a left metatarsal amputation. He will need IV vancomycin and cefepime through 12-27-20. The intraoperative culture did come back positive for pseudomonas and staph. He had a picc line inserted on 11-19-20. His hgb a1c is 11.2 (which is down from 12). He was started on lantus while in the hospital. His hyponatremia is likely related to his cirrhosis. He has a history of alcohol abuse MRI in 2018 was suggestive of early cirrhosis; he denies any recent alcohol intake. He is here for short tem rehab with his goal to return back home. He denies any uncontrolled pain; no changes in his appetite; no insomnia no anxiety. He will continue to be followed for his chronic illnesses including:  Hypertension associated with type 2 diabetes mellitus:   Type 2 diabetes with diabetic neuropathy with long term current use of insulin:  Hyperlipidemia associated with type 2 diabetes:     Past Medical History:  Diagnosis Date  . Barrett's esophagus dx 2016  . Bronchitis   . Depression   . Diabetes ([place])   . ED (erectile dysfunction)   . GERD (gastroesophageal reflux disease)   . Hepatitis C   . Hiatal hernia 10/2014   3cm  . Neuropathy      Past Surgical History:  Procedure Laterality Date  . AMPUTATION Left 11/15/2020   Procedure: LEFT TRANSMETATARSAL AMPUTATION;  Surgeon: Newt Minion, MD;  Location: [facility];  Service: Orthopedics;  Laterality: Left;  . APPENDECTOMY    . COLONOSCOPY N/A 02/16/2014   Procedure: COLONOSCOPY;  Surgeon: Gatha Mayer, MD;  Location: [facility];  Service: Endoscopy;  Laterality: N/A;  . COLONOSCOPY    . TONSILLECTOMY      Social History   Socioeconomic History  . Marital status: Single    Spouse name: Not on file  . Number of children: Not on file  . Years of education: Not on file  . Highest education level: Not on file  Occupational History  . Not on file  Tobacco Use  . Smoking status: Current Every Day Smoker    Packs/day: 0.50    Years: 35.00    Pack years: 17.50    Types: Cigarettes  . Smokeless tobacco: Never Used  . Tobacco comment: none x 3 weeks as of 05/22/2020  Vaping Use  . Vaping Use: Never used  Substance and Sexual Activity  . Alcohol use: Not Currently    Comment: daily  . Drug use: No  . Sexual activity: Not on file  Other Topics Concern  . Not on file  Social History Narrative  . Not on file   Social Determinants of Health   Financial Resource Strain: Not on file  Food Insecurity: Not on file  Transportation Needs: Not on file  Physical Activity: Not on file  Stress: Not on file  Social Connections: Not on file  Intimate Partner Violence: Not on file   Family History  Problem Relation Age of Onset  . Cancer Mother        breast  . Diabetes Mother   . Heart disease Maternal Grandfather   . Pancreatic cancer Paternal Grandmother   . Stroke Father   . Alcohol abuse Father   . Pancreatic cancer Father   . Colon cancer Neg Hx   . Stomach cancer Neg Hx   . Rectal cancer Neg Hx   . Esophageal cancer Neg Hx       VITAL SIGNS BP 120/72   Pulse 64   Temp (!) 97 F (36.1 C)   Resp 20   Ht '5\' 11"'  (1.803 m)   Wt 163 lb 3.2 oz (74 kg)    SpO2 97%   BMI 22.76 kg/m   Outpatient Encounter Medications as of 11/27/2020  Medication Sig  . acetaminophen (TYLENOL) 325 MG tablet Take 2 tablets (650 mg total) by mouth every 6 (six) hours as needed for mild pain (or Fever >/= 101).  Marland Kitchen albuterol (VENTOLIN HFA) 108 (90 Base) MCG/ACT inhaler Inhale 2 puffs into the lungs every 6 (six) hours as needed for wheezing or shortness of breath.  Marland Kitchen aspirin EC 81 MG tablet Take 1 tablet (81 mg total) by mouth daily.  Marland Kitchen atorvastatin (LIPITOR) 40 MG tablet Take 1 tablet (40 mg total) by mouth daily. Please fill as a 90 day supply.  Marland Kitchen ceFEPime (MAXIPIME) IVPB Inject 2 g into the vein every 8 (eight) hours. Indication: Osteomyelitis  First Dose: No Last Day of Therapy:  12/27/2020 Labs - Once weekly:  CBC/D and BMP, Labs - Every other week:  ESR and CRP Method of administration: IV Push Method of administration may be changed at the discretion of home infusion pharmacist based upon assessment of the patient and/or caregiver's ability to self-administer the medication ordered.  . DULoxetine (CYMBALTA) 60 MG capsule Take 1 capsule (60 mg total) by mouth daily. Please fill as a 90 day supply  . folic acid (FOLVITE) 1 MG tablet Take 1 tablet (1 mg total) by mouth daily.  . Heparin Lock Flush (HEPARIN FLUSH) 10 UNIT/ML SOLN injection Inject into the vein. Give 3 cc Intravenous 5 times a day  . insulin glargine (LANTUS) 100 UNIT/ML injection Inject 0.38 mLs (38 Units total) into the skin 2 (two) times daily.  . Insulin Lispro Junior KwikPen 100 UNIT/ML SOPN Inject 5 Units into the skin with breakfast, with lunch, and with evening meal. Special Instructions: If accu-check is >/= 150. Hold if less than 150. Notify provider of results under 60 or over 400.  Marland Kitchen Insulin Lispro Junior KwikPen 100 UNIT/ML SOPN Inject 8 Units into the skin with breakfast, with lunch, and with evening meal.  . loperamide (IMODIUM A-D) 2 MG tablet Take 1 tablet (2 mg total) by mouth 4  (four) times daily as needed for diarrhea or loose stools.  Marland Kitchen losartan (COZAAR) 50 MG tablet Take 1 tablet (50 mg total) by mouth daily. Please fill as a 90 day supply  . mirtazapine (REMERON) 15 MG tablet Take 1 tablet (15 mg total) by mouth at bedtime. Please fill as a 90 day supply  . Multiple Vitamins-Minerals (THEREMS-M) TABS Take 400 mcg by mouth daily in the afternoon.  . nicotine (NICODERM CQ -  DOSED IN MG/24 HR) 7 mg/24hr patch Place 1 patch (7 mg total) onto the skin daily.  . NON FORMULARY dial antibacterial soap liquid; topical Special Instructions: For left foot wound treatment as ordered.  . NON FORMULARY Diet Regular, Consistent Carbohydrate  . omeprazole (PRILOSEC) 40 MG capsule Take 1 capsule (40 mg total) by mouth daily. Please fill as a 90 day supply  . oxyCODONE (OXY IR/ROXICODONE) 5 MG immediate release tablet Take 5 mg by mouth every 6 (six) hours as needed for severe pain.  Marland Kitchen saccharomyces boulardii (FLORASTOR) 250 MG capsule Take 1 capsule (250 mg total) by mouth 2 (two) times daily.  . sodium chloride 0.9 % injection Inject into the vein. Normal Saline Flush (sodium chloride 0.9 % (flush)); syringe; - ;  1st Order:  amt: 5cc; injection Special Instructions: Flush IV with 5cc saline prior to IV ABO use. 5 Times Per Day 08:00 AM, 10:00 AM, 04:00 PM, 10:00 PM, 12:00 AM  2nd Order:  amt: 5cc; injection Special Instructions: Flush IV with 5cc saline after completion of IV ABO use. 5 Times Per Day 08:45 AM, 10:45 AM, 04:45 PM, 10:45 PM, 12:45 AM  . vancomycin IVPB Inject 1,500 mg into the vein every 12 (twelve) hours. Indication:  Osteomyelitis  First Dose: No Last Day of Therapy:  12/27/2020 Labs - Sunday/Monday:  CBC/D, BMP, and vancomycin trough. Labs - Thursday:  BMP and vancomycin trough Labs - Every other week:  ESR and CRP Method of administration:Elastomeric Method of administration may be changed at the discretion of the patient and/or caregiver's ability to  self-administer the medication ordered.   No facility-administered encounter medications on file as of 11/27/2020.     SIGNIFICANT DIAGNOSTIC EXAMS  TODAY  11-14-20: left foot MRI:  1. Progressive deep skin ulceration along the medial and plantar aspects of the great toe with new osteomyelitis of the first proximal and distal phalanges. New 3.6 x 3.5 x 3.1 cm abscess centered in the first intermetatarsal space. 2. Increased now moderate first MTP joint effusion with new mild marrow edema in the first metatarsal head, concerning for septic arthritis and early osteomyelitis. 3. Early osteomyelitis of the second metatarsal head which lies immediately adjacent to the abscess. New marrow edema within the second proximal phalanx is equivocal for osteomyelitis.  11-14-20: left tibia/fibula MRI 1. Circumferential soft tissue swelling of the lower leg is nonspecific but may reflect cellulitis given clinical history. No abscess or osteomyelitis  LABS REVIEWED TODAY;  10-28-20: hgb a1c 11.2  11-14-20: wbc 9.1; hgb 13.5 hct 40.0; mcv 88.1 plt 381; glucose 307; bun 8; creat 0.51; k+ 4.2; na++ 131; ca 84 GFR >60; liver normal albumin 2.8 11-16-20: wbc 13.3; hgb 12.4; hct 89.7 plt 357; glucose 368; bun 9; creat 0.58; k+ 3.8; na++ 131; ca 8.2 ca 8.8 GFR >60 11-25-20: glucose 309; bun 7; creat 0.51; k+ 4.3; na++ 135; ca 9.4 GFR >60 11-27-20: glucose 277; bun 8; creat 0.49; k+ 4.3; na++ 130 ca 9.2 GFR >60  Review of Systems  Constitutional: Negative for malaise/fatigue.  Respiratory: Negative for cough and shortness of breath.   Cardiovascular: Negative for chest pain, palpitations and leg swelling.  Gastrointestinal: Negative for abdominal pain, constipation and heartburn.  Musculoskeletal: Negative for back pain, joint pain and myalgias.  Skin:       Metatarsal amputation   Neurological: Negative for dizziness.  Psychiatric/Behavioral: The patient is not nervous/anxious.    Physical Exam Constitutional:       General: He is  not in acute distress.    Appearance: He is well-developed. He is not diaphoretic.  Neck:     Thyroid: No thyromegaly.  Cardiovascular:     Rate and Rhythm: Normal rate and regular rhythm.     Heart sounds: Normal heart sounds.     Comments: Pedal pulses present  Pulmonary:     Effort: Pulmonary effort is normal. No respiratory distress.     Breath sounds: Normal breath sounds.  Abdominal:     General: Bowel sounds are normal. There is no distension.     Palpations: Abdomen is soft.     Tenderness: There is no abdominal tenderness.  Musculoskeletal:     Cervical back: Neck supple.     Right lower leg: No edema.     Left lower leg: No edema.     Comments: Is able to move all extremities   Lymphadenopathy:     Cervical: No cervical adenopathy.  Skin:    General: Skin is warm and dry.     Comments: Left foot metatarsal amputation without signs of infection present PICC Line right arm  Neurological:     Mental Status: He is alert and oriented to person, place, and time.  Psychiatric:        Mood and Affect: Mood normal.       ASSESSMENT/ PLAN:  TODAY  1. Subacute osteomyelitis left foot / sepsis without acute organ dysfunction due to unspecified organism s/p transmetatarsal amputation of left foot: is presently stable will continue cefepime 2 gm three times daily and vancomycin (pharmacy to dose) through 12-27-20. Will monitor and will follow up with orthopedics as indicated has oxycodone 5 mg every 6 hours as needed for pain.   2. Hypertension associated with type 2 diabetes mellitus: is stable b/p 120/72: will continue cozaar 50 mg daily and will monitor his status.   3. Type 2 diabetes with diabetic neuropathy with long term current use of insulin: is without change hgb a1c 11.2. will continue asa 81 mg daily; lantus 38 units twice daily; lispro: 8 units with meals with an additional 8 units for cbg>150.   4. Hyperlipidemia associated with type 2  diabetes: LDL 62; will continue lipitor 40 mg daily   5. Chronic hepatitis C without coma / alcoholic cirrhosis of liver without ascites: is stable will monitor  6.  Tobacco use disorder: is on nicotine 7 mg daily   7. GERD without esophagitis: is stable will continue prilosec 40 mg daily   8. Major depression recurrent chronic: is stable will continue cymbalta 60 mg daily and remeron 15 mg nightly  9. Aortic atherosclerosis (ct 05-13-20) will monitor is on asa 81 mg daily   10. Diabetic mononeuropathy associated with type 2 diabetes mellitus: is stable will continue cymbalta 60 mg daily   Time spent with patient: 40 minutes: >50% of time spent with coordination of care to include pt/ot/st and follow up appointment; counseling regarding a diabetic diet.    Ok Edwards NP [facility]  Contact [phone] Monday through Friday 8am- 5pm  After hours call [phone]

## 2020-11-28 ENCOUNTER — Encounter: Payer: Self-pay | Admitting: Infectious Disease

## 2020-11-28 ENCOUNTER — Non-Acute Institutional Stay (SKILLED_NURSING_FACILITY): Payer: Self-pay | Admitting: Internal Medicine

## 2020-11-28 ENCOUNTER — Ambulatory Visit (INDEPENDENT_AMBULATORY_CARE_PROVIDER_SITE_OTHER): Payer: Medicaid Other | Admitting: Infectious Disease

## 2020-11-28 ENCOUNTER — Encounter: Payer: Self-pay | Admitting: Internal Medicine

## 2020-11-28 ENCOUNTER — Other Ambulatory Visit: Payer: Self-pay | Admitting: Adult Health

## 2020-11-28 VITALS — BP 148/75 | HR 86 | Temp 97.1°F | Wt 156.8 lb

## 2020-11-28 DIAGNOSIS — M86272 Subacute osteomyelitis, left ankle and foot: Secondary | ICD-10-CM

## 2020-11-28 DIAGNOSIS — A419 Sepsis, unspecified organism: Secondary | ICD-10-CM | POA: Diagnosis not present

## 2020-11-28 DIAGNOSIS — R6520 Severe sepsis without septic shock: Secondary | ICD-10-CM

## 2020-11-28 DIAGNOSIS — R7989 Other specified abnormal findings of blood chemistry: Secondary | ICD-10-CM

## 2020-11-28 DIAGNOSIS — I700 Atherosclerosis of aorta: Secondary | ICD-10-CM | POA: Insufficient documentation

## 2020-11-28 DIAGNOSIS — Z89432 Acquired absence of left foot: Secondary | ICD-10-CM

## 2020-11-28 DIAGNOSIS — F1010 Alcohol abuse, uncomplicated: Secondary | ICD-10-CM

## 2020-11-28 DIAGNOSIS — E1169 Type 2 diabetes mellitus with other specified complication: Secondary | ICD-10-CM | POA: Insufficient documentation

## 2020-11-28 DIAGNOSIS — K7460 Unspecified cirrhosis of liver: Secondary | ICD-10-CM | POA: Diagnosis not present

## 2020-11-28 DIAGNOSIS — F17200 Nicotine dependence, unspecified, uncomplicated: Secondary | ICD-10-CM

## 2020-11-28 DIAGNOSIS — J9601 Acute respiratory failure with hypoxia: Secondary | ICD-10-CM

## 2020-11-28 DIAGNOSIS — K219 Gastro-esophageal reflux disease without esophagitis: Secondary | ICD-10-CM | POA: Insufficient documentation

## 2020-11-28 DIAGNOSIS — E785 Hyperlipidemia, unspecified: Secondary | ICD-10-CM | POA: Insufficient documentation

## 2020-11-28 DIAGNOSIS — I152 Hypertension secondary to endocrine disorders: Secondary | ICD-10-CM | POA: Insufficient documentation

## 2020-11-28 DIAGNOSIS — Z794 Long term (current) use of insulin: Secondary | ICD-10-CM

## 2020-11-28 DIAGNOSIS — F339 Major depressive disorder, recurrent, unspecified: Secondary | ICD-10-CM | POA: Insufficient documentation

## 2020-11-28 DIAGNOSIS — E1140 Type 2 diabetes mellitus with diabetic neuropathy, unspecified: Secondary | ICD-10-CM | POA: Insufficient documentation

## 2020-11-28 HISTORY — DX: Acquired absence of left foot: Z89.432

## 2020-11-28 MED ORDER — OXYCODONE HCL 5 MG PO TABS: 5.0000 mg | ORAL_TABLET | Freq: Four times a day (QID) | ORAL | 0 refills | Status: AC | PRN

## 2020-11-28 NOTE — Assessment & Plan Note (Addendum)
Receiving antibiotic therapy at SNF with wound monitor by Wound Care Nurse

## 2020-11-28 NOTE — Assessment & Plan Note (Addendum)
Orthopedic follow-up was to be 1 week post discharge.  Wound Care Nurse will monitor wound at SNF.

## 2020-11-28 NOTE — Assessment & Plan Note (Addendum)
10/28/2020 A1c 11.2% indicating severely uncontrolled diabetes.  Almost 85% increased risk of vascular complications with this B7K.  This was discussed with the patient and he comprehends this. Here at SNF Accu-Cheks range from 215 up to 320 premeal and 249-359 nightly.  He is on basal insulin as well as NovoLog bolus AC and as needed bolus. Dietary interventions discussed with the patient.  Insulin doses will be adjusted based on random glucoses.

## 2020-11-28 NOTE — Progress Notes (Signed)
NURSING HOME LOCATION:  [facility] ROOM NUMBER:  139/W  CODE STATUS: DNR   PCP:  [name], NP  This is a comprehensive admission note to this SNFperformed on this date less than 30 days from date of admission. Included are preadmission medical/surgical history; reconciled medication list; family history; social history and comprehensive review of systems.  Corrections and additions to the records were documented. Comprehensive physical exam was also performed. Additionally a clinical summary was entered for each active diagnosis pertinent to this admission in the Problem List to enhance continuity of care.  HPI: He was hospitalized 3/16-3/29/2022 with LLE cellulitis and left foot osteomyelitis in the context of uncontrolled type 2 diabetes.  MRI confirmed osteomyelitis and abscess of the foot.  Orthopedic surgeon, Dr Sharol Given consulted and performed a transmetatarsal amputation on 3/18. Intraoperative culture was positive for Pseudomonas and staph epidermidis.  Dr. Tommy Medal, ID consulted and prescribed the antibiotic therapy. PICC line was placed 3/22 to facilitate 6 weeks of OP antibiotic therapy. Initially he underwent triple antibiotic therapy with cefepime, vancomycin, and Flagyl.  Flagyl was discontinued 3/19. Hospital course was complicated by hyponatremia & hyperglycemia for which SSI was Rxed. He was discharged to the SNF for IV antibiotic course completion, rehab, and wound care.  Orthopedic follow-up was to be in 1 week post discharge.  Past medical and surgical history: Includes essential hypertension, history of alcohol abuse , chronic hepatitis C , fatty liver/early cirrhosis of the liver, chronic anemia, dyslipidemia, GERD, and history of depression. Other surgeries and procedures include colonoscopy as well as appendectomy.  Social history: As noted there is a history of alcohol abuse which apparently is in remission.  He has not smoked since 9/22.  He is  using a NicoDerm patch.  He works in [employer] which is 1 of many jobs he has held to date. Formerly was a Air cabin crew.  Family history: Extensive family history reviewed.  There is a strong family history of malignancy.   Review of systems: He is alert and oriented and very erudite.  He relates his poorly controlled diabetes to dietary indiscretion with the consumption of excessive HFCS sweetened foods and drinks.  He denies any associated diabetic symptomatology at this time.  He has minor discomfort at the operative site of the left foot.  He does describe some loose stool.  He believes he is on a probiotic.  He saw the ID specialist today and was told he was doing well.  It is his understanding he was supposed to be nonweightbearing for 3 weeks.  He has an appointment with Dr. Jess Barters office Monday.  Constitutional: No fever, significant weight change, fatigue  Eyes: No redness, discharge, pain, vision change ENT/mouth: No nasal congestion, purulent discharge, earache, change in hearing, sore throat  Cardiovascular: No chest pain, palpitations, paroxysmal nocturnal dyspnea, claudication, edema  Respiratory: No cough, sputum production, hemoptysis, DOE, significant snoring, apnea Gastrointestinal: No heartburn, dysphagia, abdominal pain, nausea /vomiting, rectal bleeding, melena Genitourinary: No dysuria, hematuria, pyuria, incontinence, nocturia Musculoskeletal: No joint stiffness, joint swelling, weakness Dermatologic: No rash, pruritus, change in appearance of skin Neurologic: No dizziness, headache, syncope, seizures, numbness, tingling Psychiatric: No significant anxiety, depression, insomnia, anorexia Endocrine: No change in hair/skin/nails, excessive thirst, excessive hunger, excessive urination  Hematologic/lymphatic: No significant bruising, lymphadenopathy, abnormal bleeding Allergy/immunology: No itchy/watery eyes, significant sneezing, urticaria, angioedema  Physical exam:   Pertinent or positive findings: He is thin but adequately nourished.  Pattern alopecia is present.  He  has a mustache and a beard.  Clinically the corner of the left mouth appears to sag but there is no significant asymmetry in the nasolabial folds.  He also has an upper partial.  The left foot is visibly shorter than the right.  Exam was not completed as it is dressed.  Posterior tibial pulses are stronger than the dorsalis pedis pulses.PICC line RUE.  General appearance:  no acute distress, increased work of breathing is present.   Lymphatic: No lymphadenopathy about the head, neck, axilla. Eyes: No conjunctival inflammation or lid edema is present. There is no scleral icterus. Ears:  External ear exam shows no significant lesions or deformities.   Nose:  External nasal examination shows no deformity or inflammation. Nasal mucosa are pink and moist without lesions, exudates Oral exam: Lips and gums are healthy appearing.There is no oropharyngeal erythema or exudate. Neck:  No thyromegaly, masses, tenderness noted.    Heart:  Normal rate and regular rhythm. S1 and S2 normal without gallop, murmur, click, rub.  Lungs: Chest clear to auscultation without wheezes, rhonchi, rales, rubs. Abdomen: Bowel sounds are normal.  Abdomen is soft and nontender with no organomegaly, hernias, masses. GU: Deferred  Extremities:  No cyanosis, clubbing, edema. Neurologic exam: Balance, Rhomberg, finger to nose testing could not be completed due to clinical state Skin: Warm & dry w/o tenting. No significant rash.  See clinical summary under each active problem in the Problem List with associated updated therapeutic plan

## 2020-11-28 NOTE — Patient Instructions (Signed)
See assessment and plan under each diagnosis in the problem list and acutely for this visit 

## 2020-11-28 NOTE — Progress Notes (Signed)
Subjective:  Chief complaint follow-up for diabetic foot infection with osteomyelitis   Patient ID: [name] [name], male    DOB: Oct 16, 1960, 60 y.o.   MRN: [id number]  HPI  [name] [name] is a 60 year old Caucasian male with cirrhosis of the liver history of alcoholism who lives in a boarding house unfortunately developed a diabetic foot infection is status post transmetatarsal amputation with MRSA and Pseudomonas aeruginosa having been isolated intraoperatively.  When I saw him in the hospital we placed him on cefepime with vancomycin and have plans for him to complete antibiotics of 29 April.  His boardinghouse was not felt to be a suitable place for him to receive IV antibiotics via home health agency and he was therefore ultimately transferred to [facility] this past Tuesday.  They continue him on antibiotics.  He is telling me that he would like to come out of [facility] and go back to the boardinghouse and manage antibiotics there when he has been off his foot for 3 weeks which is the time.  He states that his orthopedic surgeon told him to stay off his foot.    Past Medical History:  Diagnosis Date  . Barrett's esophagus dx 2016  . Bronchitis   . Depression   . Diabetes ([place])   . ED (erectile dysfunction)   . GERD (gastroesophageal reflux disease)   . Hepatitis C   . Hiatal hernia 10/2014   3cm  . Neuropathy   . S/P transmetatarsal amputation of foot, left ([place]) 11/28/2020    Past Surgical History:  Procedure Laterality Date  . AMPUTATION Left 11/15/2020   Procedure: LEFT TRANSMETATARSAL AMPUTATION;  Surgeon: Newt Minion, MD;  Location: [place];  Service: Orthopedics;  Laterality: Left;  . APPENDECTOMY    . COLONOSCOPY N/A 02/16/2014   Procedure: COLONOSCOPY;  Surgeon: Gatha Mayer, MD;  Location: [facility];  Service: Endoscopy;  Laterality: N/A;  . COLONOSCOPY    . TONSILLECTOMY      Family History  Problem Relation Age of Onset  . Cancer Mother         breast  . Diabetes Mother   . Heart disease Maternal Grandfather   . Pancreatic cancer Paternal Grandmother   . Stroke Father   . Alcohol abuse Father   . Pancreatic cancer Father   . Colon cancer Neg Hx   . Stomach cancer Neg Hx   . Rectal cancer Neg Hx   . Esophageal cancer Neg Hx       Social History   Socioeconomic History  . Marital status: Single    Spouse name: Not on file  . Number of children: Not on file  . Years of education: Not on file  . Highest education level: Not on file  Occupational History  . Not on file  Tobacco Use  . Smoking status: Current Every Day Smoker    Packs/day: 0.50    Years: 35.00    Pack years: 17.50    Types: Cigarettes  . Smokeless tobacco: Never Used  . Tobacco comment: none x 3 weeks as of 05/22/2020  Vaping Use  . Vaping Use: Never used  Substance and Sexual Activity  . Alcohol use: Not Currently    Comment: daily  . Drug use: No  . Sexual activity: Not on file  Other Topics Concern  . Not on file  Social History Narrative  . Not on file   Social Determinants of Health   Financial Resource Strain: Not on  file  Food Insecurity: Not on file  Transportation Needs: Not on file  Physical Activity: Not on file  Stress: Not on file  Social Connections: Not on file    Allergies  Allergen Reactions  . Bee Venom Anaphylaxis  . Lactose Intolerance (Gi) Other (See Comments)    GI upset    No current outpatient medications on file.    Review of Systems  Constitutional: Negative for activity change, appetite change, chills, diaphoresis, fatigue, fever and unexpected weight change.  HENT: Negative for congestion, rhinorrhea, sinus pressure, sneezing, sore throat and trouble swallowing.   Eyes: Negative for photophobia and visual disturbance.  Respiratory: Negative for cough, chest tightness, shortness of breath, wheezing and stridor.   Cardiovascular: Negative for chest pain, palpitations and leg swelling.   Gastrointestinal: Negative for abdominal distention, abdominal pain, anal bleeding, blood in stool, constipation, diarrhea, nausea and vomiting.  Genitourinary: Negative for difficulty urinating, dysuria, flank pain and hematuria.  Musculoskeletal: Negative for arthralgias, back pain, gait problem, joint swelling and myalgias.  Skin: Positive for wound. Negative for color change, pallor and rash.  Neurological: Negative for dizziness, tremors, weakness and light-headedness.  Hematological: Negative for adenopathy. Does not bruise/bleed easily.  Psychiatric/Behavioral: Negative for agitation, behavioral problems, confusion, decreased concentration, dysphoric mood and sleep disturbance.       Objective:   Physical Exam Constitutional:      General: He is not in acute distress.    Appearance: Normal appearance. He is well-developed. He is not ill-appearing or diaphoretic.  HENT:     Head: Normocephalic and atraumatic.     Right Ear: Hearing and external ear normal.     Left Ear: Hearing and external ear normal.     Nose: No nasal deformity or rhinorrhea.  Eyes:     General: No scleral icterus.    Conjunctiva/sclera: Conjunctivae normal.     Right eye: Right conjunctiva is not injected.     Left eye: Left conjunctiva is not injected.     Pupils: Pupils are equal, round, and reactive to light.  Neck:     Vascular: No JVD.  Cardiovascular:     Rate and Rhythm: Normal rate and regular rhythm.     Heart sounds: Normal heart sounds, S1 normal and S2 normal. No murmur heard. No friction rub.  Pulmonary:     Effort: Pulmonary effort is normal. No respiratory distress.  Abdominal:     General: Bowel sounds are normal. There is no distension.     Palpations: Abdomen is soft.     Tenderness: There is no abdominal tenderness.  Musculoskeletal:        General: Normal range of motion.     Right shoulder: Normal.     Left shoulder: Normal.     Cervical back: Normal range of motion and neck  supple.     Right hip: Normal.     Left hip: Normal.     Right knee: Normal.     Left knee: Normal.  Lymphadenopathy:     Head:     Right side of head: No submandibular, preauricular or posterior auricular adenopathy.     Left side of head: No submandibular, preauricular or posterior auricular adenopathy.     Cervical: No cervical adenopathy.     Right cervical: No superficial or deep cervical adenopathy.    Left cervical: No superficial or deep cervical adenopathy.  Skin:    General: Skin is warm and dry.     Coloration: Skin is not  pale.     Findings: No abrasion, bruising, ecchymosis, erythema, lesion or rash.     Nails: There is no clubbing.  Neurological:     Mental Status: He is alert and oriented to person, place, and time.     Sensory: No sensory deficit.     Coordination: Coordination normal.     Gait: Gait normal.  Psychiatric:        Attention and Perception: He is attentive.        Mood and Affect: Mood normal.        Speech: Speech normal.        Behavior: Behavior normal. Behavior is cooperative.        Thought Content: Thought content normal.        Judgment: Judgment normal.    PIC line November 28, 2020:    Transmetatarsal amputation site November 28, 2020:        Assessment & Plan:   Osteomyelitis due to diabetic foot ulcer with MRSA and Pseudomonas having been isolated on transmetatarsal amputation:  He needs to follow-up with Dr. Sharol Given I have messaged Dr. Sharol Given through epic he will arrange follow-up.  In the interim continue vancomycin and cefepime for original plan to therapy through 29 April.  He is to have biweekly metabolic panels with GFRs and vancomycin levels weekly CBC differential sed rate CRP.  In his stay off his foot as instructed by Dr. Sharol Given.  I am not convinced that the boardinghouse is appropriate place for IV antibiotics.  At least this was what was my impression when he was an inpatient.  If he can receive him safely the boardinghouse  and can receive all the other appropriate care for his foot infection I am not against it.  This will need to however be arranged by his nursing home facility.  Cirrhosis of the liver: We will certainly put him at risk for infections and make it more difficult for him to clear them.  Diabetes mellitus followed by PCP  Tobacco use: hopefully can stay off this  Etoh abuse: similar to above re tobacco  I spent greater than 40 minutes with the patient including greater than 50% of time in face to face counsel of the patient and his mother regarding his diabetic foot infection and how we manage in coordination with care with Dr. Sharol Given and with a skilled nursing facility

## 2020-11-29 ENCOUNTER — Non-Acute Institutional Stay (SKILLED_NURSING_FACILITY): Payer: Self-pay | Admitting: Adult Health

## 2020-11-29 ENCOUNTER — Encounter: Payer: Self-pay | Admitting: Adult Health

## 2020-11-29 ENCOUNTER — Encounter: Payer: Self-pay | Admitting: Internal Medicine

## 2020-11-29 ENCOUNTER — Other Ambulatory Visit (HOSPITAL_COMMUNITY)
Admission: RE | Admit: 2020-11-29 | Discharge: 2020-11-29 | Disposition: A | Discharge status: Home / Self Care | Payer: Self-pay | Source: Skilled Nursing Facility | Attending: Internal Medicine | Admitting: Internal Medicine

## 2020-11-29 DIAGNOSIS — Z794 Long term (current) use of insulin: Secondary | ICD-10-CM

## 2020-11-29 DIAGNOSIS — M869 Osteomyelitis, unspecified: Secondary | ICD-10-CM | POA: Insufficient documentation

## 2020-11-29 DIAGNOSIS — E1140 Type 2 diabetes mellitus with diabetic neuropathy, unspecified: Secondary | ICD-10-CM

## 2020-11-29 LAB — CBC WITH DIFFERENTIAL/PLATELET
Abs Immature Granulocytes: 0.03 10*3/uL (ref 0.00–0.07)
Basophils Absolute: 0.1 10*3/uL (ref 0.0–0.1)
Basophils Relative: 1 %
Eosinophils Absolute: 0.5 10*3/uL (ref 0.0–0.5)
Eosinophils Relative: 6 %
HCT: 42.5 % (ref 39.0–52.0)
Hemoglobin: 13.8 g/dL (ref 13.0–17.0)
Immature Granulocytes: 0 %
Lymphocytes Relative: 27 %
Lymphs Abs: 2.3 10*3/uL (ref 0.7–4.0)
MCH: 29.5 pg (ref 26.0–34.0)
MCHC: 32.5 g/dL (ref 30.0–36.0)
MCV: 90.8 fL (ref 80.0–100.0)
Monocytes Absolute: 1 10*3/uL (ref 0.1–1.0)
Monocytes Relative: 11 %
Neutro Abs: 4.6 10*3/uL (ref 1.7–7.7)
Neutrophils Relative %: 55 %
Platelets: 288 10*3/uL (ref 150–400)
RBC: 4.68 MIL/uL (ref 4.22–5.81)
RDW: 12.4 % (ref 11.5–15.5)
WBC: 8.6 10*3/uL (ref 4.0–10.5)
nRBC: 0 % (ref 0.0–0.2)

## 2020-11-29 LAB — BASIC METABOLIC PANEL
Anion gap: 8 (ref 5–15)
BUN: 8 mg/dL (ref 6–20)
CO2: 24 mmol/L (ref 22–32)
Calcium: 9.3 mg/dL (ref 8.9–10.3)
Chloride: 99 mmol/L (ref 98–111)
Creatinine, Ser: 0.51 mg/dL — ABNORMAL LOW (ref 0.61–1.24)
GFR, Estimated: >60 mL/min (ref >60)
Glucose, Bld: 369 mg/dL — ABNORMAL HIGH (ref 70–99)
Potassium: 3.9 mmol/L (ref 3.5–5.1)
Sodium: 131 mmol/L — ABNORMAL LOW (ref 135–145)

## 2020-11-29 LAB — C-REACTIVE PROTEIN: CRP: 0.8 mg/dL (ref <1.0)

## 2020-11-29 LAB — VANCOMYCIN, TROUGH: Vancomycin Tr: 18 ug/mL (ref 15–20)

## 2020-11-29 LAB — SEDIMENTATION RATE: Sed Rate: 40 mm/hr — ABNORMAL HIGH (ref 0–16)

## 2020-11-29 NOTE — Assessment & Plan Note (Addendum)
CV risk , especially with uncontrolled DM,discussed with him. 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

## 2020-11-29 NOTE — Progress Notes (Signed)
Location:  [place] Room Number: 139/W Place of Service:  SNF (31)   CODE STATUS: DNR  Allergies  Allergen Reactions  . Bee Venom Anaphylaxis  . Lactose Intolerance (Gi) Other (See Comments)    GI upset    Chief Complaint  Patient presents with  . Acute Visit    Diabetes     HPI:  His cbg readings are all 200-400 range. He continues to abt for osteomyelitis. He denies any excessive hunger or thirst. He denies feeling nervous. He is taking both lantus and lispro without reports of missed doses.   Past Medical History:  Diagnosis Date  . Barrett's esophagus dx 2016  . Bronchitis   . Depression   . Diabetes ([place])   . ED (erectile dysfunction)   . GERD (gastroesophageal reflux disease)   . Hepatitis C   . Hiatal hernia 10/2014   3cm  . Neuropathy   . S/P transmetatarsal amputation of foot, left ([place]) 11/28/2020    Past Surgical History:  Procedure Laterality Date  . AMPUTATION Left 11/15/2020   Procedure: LEFT TRANSMETATARSAL AMPUTATION;  Surgeon: Newt Minion, MD;  Location: [place];  Service: Orthopedics;  Laterality: Left;  . APPENDECTOMY    . COLONOSCOPY N/A 02/16/2014   Procedure: COLONOSCOPY;  Surgeon: Gatha Mayer, MD;  Location: [facility];  Service: Endoscopy;  Laterality: N/A;  . COLONOSCOPY    . TONSILLECTOMY      Social History   Socioeconomic History  . Marital status: Single    Spouse name: Not on file  . Number of children: Not on file  . Years of education: Not on file  . Highest education level: Not on file  Occupational History  . Not on file  Tobacco Use  . Smoking status: Current Every Day Smoker    Packs/day: 0.50    Years: 35.00    Pack years: 17.50    Types: Cigarettes  . Smokeless tobacco: Never Used  . Tobacco comment: none x 3 weeks as of 05/22/2020  Vaping Use  . Vaping Use: Never used  Substance and Sexual Activity  . Alcohol use: Not Currently    Comment: daily  . Drug use: No  . Sexual  activity: Not on file  Other Topics Concern  . Not on file  Social History Narrative  . Not on file   Social Determinants of Health   Financial Resource Strain: Not on file  Food Insecurity: Not on file  Transportation Needs: Not on file  Physical Activity: Not on file  Stress: Not on file  Social Connections: Not on file  Intimate Partner Violence: Not on file   Family History  Problem Relation Age of Onset  . Cancer Mother        breast  . Diabetes Mother   . Heart disease Maternal Grandfather   . Pancreatic cancer Paternal Grandmother   . Stroke Father   . Alcohol abuse Father   . Pancreatic cancer Father   . Colon cancer Neg Hx   . Stomach cancer Neg Hx   . Rectal cancer Neg Hx   . Esophageal cancer Neg Hx       VITAL SIGNS BP (!) 115/58   Pulse 60   Temp (!) 97 F (36.1 C)   Resp 20   Ht _0  (1.803 m)   Wt 163 lb 3.2 oz (74 kg)   SpO2 96%   BMI 22.76 kg/m   Outpatient Encounter Medications as of  11/29/2020  Medication Sig  . acetaminophen (TYLENOL) 325 MG tablet Take 2 tablets (650 mg total) by mouth every 6 (six) hours as needed for mild pain (or Fever >/= 101).  Marland Kitchen albuterol (VENTOLIN HFA) 108 (90 Base) MCG/ACT inhaler Inhale 2 puffs into the lungs every 6 (six) hours as needed for wheezing or shortness of breath.  Marland Kitchen aspirin EC 81 MG tablet Take 1 tablet (81 mg total) by mouth daily.  Marland Kitchen atorvastatin (LIPITOR) 40 MG tablet Take 1 tablet (40 mg total) by mouth daily. Please fill as a 90 day supply.  Marland Kitchen ceFEPime (MAXIPIME) IVPB Inject 2 g into the vein every 8 (eight) hours.  . DULoxetine (CYMBALTA) 60 MG capsule Take 1 capsule (60 mg total) by mouth daily. Please fill as a 90 day supply  . folic acid (FOLVITE) 1 MG tablet Take 1 tablet (1 mg total) by mouth daily.  . Heparin Lock Flush (HEPARIN FLUSH) 10 UNIT/ML SOLN injection Inject into the vein. Give 3 cc Intravenous 5 times a day  . insulin glargine (LANTUS) 100 UNIT/ML Solostar Pen Inject 42 Units into  the skin 2 (two) times daily.  . insulin lispro (HUMALOG) 100 UNIT/ML KwikPen Inject 20 Units into the skin 3 (three) times daily with meals.  . insulin lispro (HUMALOG) 100 UNIT/ML KwikPen Inject 5 Units into the skin 3 (three) times daily with meals.  . insulin lispro (HUMALOG) 100 UNIT/ML KwikPen Inject 5 Units into the skin at bedtime.  Marland Kitchen loperamide (IMODIUM A-D) 2 MG tablet Take 1 tablet (2 mg total) by mouth 4 (four) times daily as needed for diarrhea or loose stools.  Marland Kitchen losartan (COZAAR) 50 MG tablet Take 1 tablet (50 mg total) by mouth daily. Please fill as a 90 day supply  . mirtazapine (REMERON) 15 MG tablet Take 1 tablet (15 mg total) by mouth at bedtime. Please fill as a 90 day supply  . Multiple Vitamins-Minerals (THEREMS-M) TABS Take 400 mcg by mouth daily in the afternoon.  . nicotine (NICODERM CQ - DOSED IN MG/24 HR) 7 mg/24hr patch Place 1 patch (7 mg total) onto the skin daily.  . NON FORMULARY dial antibacterial soap liquid; topical Special Instructions: For left foot wound treatment as ordered.  . NON FORMULARY Diet Regular, Consistent Carbohydrate  . omeprazole (PRILOSEC) 40 MG capsule Take 1 capsule (40 mg total) by mouth daily. Please fill as a 90 day supply  . oxyCODONE (OXY IR/ROXICODONE) 5 MG immediate release tablet Take 1 tablet (5 mg total) by mouth every 6 (six) hours as needed for up to 5 days for severe pain.  Marland Kitchen saccharomyces boulardii (FLORASTOR) 250 MG capsule Take 1 capsule (250 mg total) by mouth 2 (two) times daily.  . sodium chloride 0.9 % injection Inject into the vein. Normal Saline Flush (sodium chloride 0.9 % (flush)); syringe; - ;  1st Order:  amt: 5cc; injection Special Instructions: Flush IV with 5cc saline prior to IV ABO use. 5 Times Per Day 08:00 AM, 10:00 AM, 04:00 PM, 10:00 PM, 12:00 AM  2nd Order:  amt: 5cc; injection Special Instructions: Flush IV with 5cc saline after completion of IV ABO use. 5 Times Per Day 08:45 AM, 10:45 AM, 04:45 PM,  10:45 PM, 12:45 AM  . vancomycin IVPB Inject 1,500 mg into the vein every 12 (twelve) hours. Indication:  Osteomyelitis  First Dose: No Last Day of Therapy:  12/27/2020 Labs - Sunday/Monday:  CBC/D, BMP, and vancomycin trough. Labs - Thursday:  BMP and vancomycin trough  Labs - Every other week:  ESR and CRP Method of administration:Elastomeric Method of administration may be changed at the discretion of the patient and/or caregiver's ability to self-administer the medication ordered.  . [DISCONTINUED] ceFEPime (MAXIPIME) IVPB Inject 2 g into the vein every 8 (eight) hours. Indication: Osteomyelitis  First Dose: No Last Day of Therapy:  12/27/2020 Labs - Once weekly:  CBC/D and BMP, Labs - Every other week:  ESR and CRP Method of administration: IV Push Method of administration may be changed at the discretion of home infusion pharmacist based upon assessment of the patient and/or caregiver's ability to self-administer the medication ordered.  . [DISCONTINUED] insulin glargine (LANTUS) 100 UNIT/ML injection Inject 0.38 mLs (38 Units total) into the skin 2 (two) times daily.  . [DISCONTINUED] Insulin Lispro Junior KwikPen 100 UNIT/ML SOPN Inject 5 Units into the skin with breakfast, with lunch, and with evening meal. Special Instructions: If accu-check is >/= 150. Hold if less than 150. Notify provider of results under 60 or over 400.  . [DISCONTINUED] Insulin Lispro Junior KwikPen 100 UNIT/ML SOPN Inject 8 Units into the skin with breakfast, with lunch, and with evening meal.   No facility-administered encounter medications on file as of 11/29/2020.     SIGNIFICANT DIAGNOSTIC EXAMS  PREVIOUS   11-14-20: left foot MRI:  1. Progressive deep skin ulceration along the medial and plantar aspects of the great toe with new osteomyelitis of the first proximal and distal phalanges. New 3.6 x 3.5 x 3.1 cm abscess centered in the first intermetatarsal space. 2. Increased now moderate first MTP joint  effusion with new mild marrow edema in the first metatarsal head, concerning for septic arthritis and early osteomyelitis. 3. Early osteomyelitis of the second metatarsal head which lies immediately adjacent to the abscess. New marrow edema within the second proximal phalanx is equivocal for osteomyelitis.  11-14-20: left tibia/fibula MRI 1. Circumferential soft tissue swelling of the lower leg is nonspecific but may reflect cellulitis given clinical history. No abscess or osteomyelitis  NO NEW EXAMS.   LABS REVIEWED PREVIOUS   10-28-20: hgb a1c 11.2  11-14-20: wbc 9.1; hgb 13.5 hct 40.0; mcv 88.1 plt 381; glucose 307; bun 8; creat 0.51; k+ 4.2; na++ 131; ca 84 GFR >60; liver normal albumin 2.8 11-16-20: wbc 13.3; hgb 12.4; hct  89.7 plt 357; glucose 368; bun 9; creat 0.58; k+ 3.8; na++ 131; ca 8.2 ca 8.8 GFR >60 11-25-20: glucose 309; bun 7; creat 0.51; k+ 4.3; na++ 135; ca 9.4 GFR >60 11-27-20: glucose 277; bun 8; creat 0.49; k+ 4.3; na++ 130 ca 9.2 GFR >60  NO NEW LABS   Review of Systems  Constitutional: Negative for malaise/fatigue.  Respiratory: Negative for cough and shortness of breath.   Cardiovascular: Negative for chest pain, palpitations and leg swelling.  Gastrointestinal: Negative for abdominal pain, constipation and heartburn.  Musculoskeletal: Negative for back pain, joint pain and myalgias.  Skin:       Metatarsal amputation   Neurological: Negative for dizziness.  Psychiatric/Behavioral: The patient is not nervous/anxious.    Physical Exam Constitutional:      General: He is not in acute distress.    Appearance: He is well-developed. He is not diaphoretic.  Neck:     Thyroid: No thyromegaly.  Cardiovascular:     Rate and Rhythm: Normal rate and regular rhythm.     Pulses: Normal pulses.     Heart sounds: Normal heart sounds.  Pulmonary:     Effort: Pulmonary effort  is normal. No respiratory distress.     Breath sounds: Normal breath sounds.  Abdominal:      General: Bowel sounds are normal. There is no distension.     Palpations: Abdomen is soft.     Tenderness: There is no abdominal tenderness.  Musculoskeletal:     Cervical back: Neck supple.     Right lower leg: No edema.     Left lower leg: No edema.     Comments: Is able to move all extremities Is status post left metatarsal amputation   Lymphadenopathy:     Cervical: No cervical adenopathy.  Skin:    General: Skin is warm and dry.     Comments: Left foot metatarsal amputation without signs of infection present PICC Line right arm   Neurological:     Mental Status: He is alert and oriented to person, place, and time.  Psychiatric:        Mood and Affect: Mood normal.      ASSESSMENT/ PLAN:  TODAY  1. Type 2 diabetes mellitus with diabetic neuropathy with long term current use of insulin: cbg readings remain elevated: hgb a1c 11.2 will continue asa 81 mg daily will change to the following: lantus 42 units twice daily lispro: 20 units with meals with an additional 5 units for cbg >=150; and 5 units hs for cbg >=150 will monitor his status.    Ok Edwards NP [facility]  Contact [phone] Monday through Friday 8am- 5pm  After hours call [phone]

## 2020-11-29 NOTE — Assessment & Plan Note (Signed)
Opiods will need to be discontinued as per [facility] as high risk for addiction with PMH of alcohol abuse

## 2020-11-29 NOTE — Assessment & Plan Note (Signed)
Current LFTs WNL; but close liver health monitor needed in context of fatty liver/ cirrhosis, chronic Hepatitis C , genetic hemochromatosis risk

## 2020-12-02 ENCOUNTER — Other Ambulatory Visit (HOSPITAL_COMMUNITY)
Admission: RE | Admit: 2020-12-02 | Discharge: 2020-12-02 | Disposition: A | Discharge status: Home / Self Care | Payer: Self-pay | Source: Skilled Nursing Facility | Attending: Adult Health | Admitting: Adult Health

## 2020-12-02 ENCOUNTER — Ambulatory Visit (INDEPENDENT_AMBULATORY_CARE_PROVIDER_SITE_OTHER): Payer: Self-pay | Admitting: Physician Assistant

## 2020-12-02 ENCOUNTER — Encounter: Payer: Self-pay | Admitting: Orthopedic Surgery

## 2020-12-02 DIAGNOSIS — J61 Pneumoconiosis due to asbestos and other mineral fibers: Secondary | ICD-10-CM | POA: Insufficient documentation

## 2020-12-02 DIAGNOSIS — M8600 Acute hematogenous osteomyelitis, unspecified site: Secondary | ICD-10-CM

## 2020-12-02 LAB — BASIC METABOLIC PANEL
Anion gap: 8 (ref 5–15)
BUN: 7 mg/dL (ref 6–20)
CO2: 24 mmol/L (ref 22–32)
Calcium: 9.6 mg/dL (ref 8.9–10.3)
Chloride: 102 mmol/L (ref 98–111)
Creatinine, Ser: 0.56 mg/dL — ABNORMAL LOW (ref 0.61–1.24)
GFR, Estimated: >60 mL/min (ref >60)
Glucose, Bld: 213 mg/dL — ABNORMAL HIGH (ref 70–99)
Potassium: 3.9 mmol/L (ref 3.5–5.1)
Sodium: 134 mmol/L — ABNORMAL LOW (ref 135–145)

## 2020-12-02 LAB — VANCOMYCIN, TROUGH: Vancomycin Tr: 13 ug/mL — ABNORMAL LOW (ref 15–20)

## 2020-12-02 NOTE — Progress Notes (Signed)
Office Visit Note   Patient: [name] [name]           Date of Birth: 03/03/1961           MRN: [id number] Visit Date: 12/02/2020              Requested by: [name], NP [address] [place],  [place] [id number] PCP: [name], NP  Chief Complaint  Patient presents with  . Left Foot - Routine Post Op    11/15/20 left foot transmet amputation       HPI: Patient is 15 days status post left transmetatarsal amputation.  He is currently on IV antibiotics.  He is at a nursing facility.  Assessment & Plan: Visit Diagnoses: No diagnosis found.  Plan: His mother is going to obtain some shrinkers to place directly over the amputation stump.  I think this will help with swelling and continued wound healing follow-up in 1 week  Follow-Up Instructions: No follow-ups on file.   Ortho Exam  Patient is alert, oriented, no adenopathy, well-dressed, normal affect, normal respiratory effort.  Examination of the amputation stump well apposed wound edges for the most part he does have some superficial dehiscence in about a 2 cm range.  It is filled with eschar and granulation tissue.  He has slight discoloration which improves with elevation no signs of acute infection or ascending cellulitis  Imaging: No results found. No images are attached to the encounter.  Labs: Lab Results  Component Value Date   HGBA1C 11.2 (H) 10/28/2020   HGBA1C 12.1 (H) 09/23/2020   HGBA1C 6.1 (H) 05/13/2020   ESRSEDRATE 40 (H) 11/29/2020   ESRSEDRATE 77 (H) 11/14/2020   ESRSEDRATE 85 (H) 11/01/2020   CRP 0.8 11/29/2020   CRP 7.7 (H) 11/01/2020   CRP 13.0 (H) 10/31/2020   LABURIC 1.1 (L) 05/16/2020   REPTSTATUS 11/18/2020 FINAL 11/15/2020   GRAMSTAIN  11/15/2020    ABUNDANT WBC PRESENT, PREDOMINANTLY PMN FEW GRAM POSITIVE COCCI RARE GRAM NEGATIVE RODS    CULT  11/15/2020    ABUNDANT PSEUDOMONAS AERUGINOSA ABUNDANT STAPHYLOCOCCUS EPIDERMIDIS NO ANAEROBES ISOLATED Performed at [facility] [address], [place], [place] [address]    LABORGA PSEUDOMONAS AERUGINOSA 11/15/2020   LABORGA STAPHYLOCOCCUS EPIDERMIDIS 11/15/2020     Lab Results  Component Value Date   ALBUMIN 2.8 (L) 11/15/2020   ALBUMIN 2.8 (L) 11/14/2020   ALBUMIN 3.4 (L) 11/13/2020    Lab Results  Component Value Date   MG 1.9 11/21/2020   MG 2.0 10/30/2020   MG 2.5 (H) 05/19/2020   Lab Results  Component Value Date   VD25OH 33 10/09/2014    No results found for: PREALBUMIN CBC EXTENDED Latest Ref Rng & Units 11/29/2020 11/21/2020 11/17/2020  WBC 4.0 - 10.5 K/uL 8.6 10.5 9.4  RBC 4.22 - 5.81 MIL/uL 4.68 4.15(L) 4.31  HGB 13.0 - 17.0 g/dL 13.8 12.3(L) 12.7(L)  HCT 39.0 - 52.0 % 42.5 37.7(L) 38.5(L)  PLT 150 - 400 K/uL 288 306 360  NEUTROABS 1.7 - 7.7 K/uL 4.6 - -  LYMPHSABS 0.7 - 4.0 K/uL 2.3 - -     There is no height or weight on file to calculate BMI.  Orders:  No orders of the defined types were placed in this encounter.  No orders of the defined types were placed in this encounter.    Procedures: No procedures performed  Clinical Data: No additional findings.  ROS:  All other systems  negative, except as noted in the HPI. Review of Systems  Objective: Vital Signs: There were no vitals taken for this visit.  Specialty Comments:  No specialty comments available.  PMFS History: Patient Active Problem List   Diagnosis Date Noted  . S/P transmetatarsal amputation of foot, left ([place]) 11/28/2020  . Hypertension associated with type 2 diabetes mellitus ([place]) 11/28/2020  . Type 2 diabetes mellitus with diabetic neuropathy, unspecified ([place]) 11/28/2020  . Hyperlipidemia associated with type 2 diabetes mellitus ([place]) 11/28/2020  . GERD without esophagitis 11/28/2020  . Major depression, recurrent, chronic ([place]) 11/28/2020  . Aortic atherosclerosis ([place]) 11/28/2020  . Diabetic neuropathy ([place]) 11/28/2020  . Osteomyelitis ([place]) 11/14/2020  . Sepsis ([place]) 10/28/2020  .  Community acquired pneumonia of right lower lobe of lung   . Elevated LFTs   . Hypophosphatemia   . Acute metabolic encephalopathy   . Acute maxillary sinusitis   . Depression   . Physical deconditioning   . Pneumonia 05/14/2020  . DTs (delirium tremens) ([place]) 05/13/2020  . Hyponatremia 05/13/2020  . Hypokalemia 05/13/2020  . SIRS (systemic inflammatory response syndrome) ([place]) 05/13/2020  . Sepsis with acute hypoxic respiratory failure without septic shock ([place])   . Alcohol abuse with alcohol-induced mood disorder ([place]) 12/17/2017  . Hereditary hemochromatosis ([place]) 11/23/2017  . 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 Essential hypertension 04/02/2017  . Hypogonadism male 08/13/2016  . ETOH abuse 06/22/2016  . Low serum testosterone level 01/08/2016  . Type 2 diabetes mellitus with other specified complication ([place]) 60/45/4098  . Erectile dysfunction 09/12/2015  . Hepatic cirrhosis ([place]) 01/10/2015  . Chronic hepatitis C without hepatic coma ([place]) 10/31/2014  . Tobacco use disorder 08/03/2013  . Diabetes type 2, controlled ([place]) 04/27/2013   Past Medical History:  Diagnosis Date  . Barrett's esophagus dx 2016  . Bronchitis   . Depression   . Diabetes ([place])   . ED (erectile dysfunction)   . GERD (gastroesophageal reflux disease)   . Hepatitis C   . Hiatal hernia 10/2014   3cm  . Neuropathy   . S/P transmetatarsal amputation of foot, left ([place]) 11/28/2020    Family History  Problem Relation Age of Onset  . Cancer Mother        breast  . Diabetes Mother   . Heart disease Maternal Grandfather   . Pancreatic cancer Paternal Grandmother   . Stroke Father   . Alcohol abuse Father   . Pancreatic cancer Father   . Colon cancer Neg Hx   . Stomach cancer Neg Hx   . Rectal cancer Neg Hx   . Esophageal cancer Neg Hx     Past Surgical History:  Procedure Laterality Date  . AMPUTATION Left 11/15/2020   Procedure: LEFT TRANSMETATARSAL AMPUTATION;   Surgeon: Newt Minion, MD;  Location: [facility];  Service: Orthopedics;  Laterality: Left;  . APPENDECTOMY    . COLONOSCOPY N/A 02/16/2014   Procedure: COLONOSCOPY;  Surgeon: Gatha Mayer, MD;  Location: [facility];  Service: Endoscopy;  Laterality: N/A;  . COLONOSCOPY    . TONSILLECTOMY     Social History   Occupational History  . Not on file  Tobacco Use  . Smoking status: Current Every Day Smoker    Packs/day: 0.50    Years: 35.00    Pack years: 17.50    Types: Cigarettes  . Smokeless tobacco: Never Used  . Tobacco comment: none x 3 weeks as of 05/22/2020  Vaping Use  . Vaping  Use: Never used  Substance and Sexual Activity  . Alcohol use: Not Currently    Comment: daily  . Drug use: No  . Sexual activity: Not on file

## 2020-12-03 ENCOUNTER — Other Ambulatory Visit (HOSPITAL_COMMUNITY)
Admission: RE | Admit: 2020-12-03 | Discharge: 2020-12-03 | Disposition: A | Discharge status: Home / Self Care | Payer: Self-pay | Source: Skilled Nursing Facility | Attending: Adult Health | Admitting: Adult Health

## 2020-12-03 ENCOUNTER — Non-Acute Institutional Stay (SKILLED_NURSING_FACILITY): Payer: Self-pay | Admitting: Adult Health

## 2020-12-03 ENCOUNTER — Encounter: Payer: Self-pay | Admitting: Adult Health

## 2020-12-03 DIAGNOSIS — M8600 Acute hematogenous osteomyelitis, unspecified site: Secondary | ICD-10-CM

## 2020-12-03 DIAGNOSIS — B958 Unspecified staphylococcus as the cause of diseases classified elsewhere: Secondary | ICD-10-CM | POA: Insufficient documentation

## 2020-12-03 NOTE — Progress Notes (Signed)
Location:  [facility] Room Number: 139 Place of Service:  SNF (31)   CODE STATUS: dnr  Allergies  Allergen Reactions  . Bee Venom Anaphylaxis  . Lactose Intolerance (Gi) Other (See Comments)    GI upset    Chief Complaint  Patient presents with  . Acute Visit    Pain management      HPI:  He is presently taking oxycodone 5 mg every 6 hours as needed. He is not using this medication frequently. He takes this medication post transmetatarsal amputation.  We have discussed his pain regimen. He is willing to stop the oxycodone and to utilize tylenol.   Past Medical History:  Diagnosis Date  . Barrett's esophagus dx 2016  . Bronchitis   . Depression   . Diabetes ([place])   . ED (erectile dysfunction)   . GERD (gastroesophageal reflux disease)   . Hepatitis C   . Hiatal hernia 10/2014   3cm  . Neuropathy   . S/P transmetatarsal amputation of foot, left ([place]) 11/28/2020    Past Surgical History:  Procedure Laterality Date  . AMPUTATION Left 11/15/2020   Procedure: LEFT TRANSMETATARSAL AMPUTATION;  Surgeon: Newt Minion, MD;  Location: [facility];  Service: Orthopedics;  Laterality: Left;  . APPENDECTOMY    . COLONOSCOPY N/A 02/16/2014   Procedure: COLONOSCOPY;  Surgeon: Gatha Mayer, MD;  Location: [facility];  Service: Endoscopy;  Laterality: N/A;  . COLONOSCOPY    . TONSILLECTOMY      Social History   Socioeconomic History  . Marital status: Single    Spouse name: Not on file  . Number of children: Not on file  . Years of education: Not on file  . Highest education level: Not on file  Occupational History  . Not on file  Tobacco Use  . Smoking status: Current Every Day Smoker    Packs/day: 0.50    Years: 35.00    Pack years: 17.50    Types: Cigarettes  . Smokeless tobacco: Never Used  . Tobacco comment: none x 3 weeks as of 05/22/2020  Vaping Use  . Vaping Use: Never used  Substance and Sexual Activity  . Alcohol use: Not Currently     Comment: daily  . Drug use: No  . Sexual activity: Not on file  Other Topics Concern  . Not on file  Social History Narrative  . Not on file   Social Determinants of Health   Financial Resource Strain: Not on file  Food Insecurity: Not on file  Transportation Needs: Not on file  Physical Activity: Not on file  Stress: Not on file  Social Connections: Not on file  Intimate Partner Violence: Not on file   Family History  Problem Relation Age of Onset  . Cancer Mother        breast  . Diabetes Mother   . Heart disease Maternal Grandfather   . Pancreatic cancer Paternal Grandmother   . Stroke Father   . Alcohol abuse Father   . Pancreatic cancer Father   . Colon cancer Neg Hx   . Stomach cancer Neg Hx   . Rectal cancer Neg Hx   . Esophageal cancer Neg Hx       VITAL SIGNS BP 128/66   Pulse 72   Temp 98.5 F (36.9 C)   Resp 20   Ht 5' 11"  (1.803 m)   Wt 163 lb 3.2 oz (74 kg)   SpO2 96%   BMI  22.76 kg/m   Outpatient Encounter Medications as of 12/03/2020  Medication Sig Note  . acetaminophen (TYLENOL) 325 MG tablet Take 2 tablets (650 mg total) by mouth every 6 (six) hours as needed for mild pain (or Fever >/= 101).   Marland Kitchen albuterol (VENTOLIN HFA) 108 (90 Base) MCG/ACT inhaler Inhale 2 puffs into the lungs every 6 (six) hours as needed for wheezing or shortness of breath.   Marland Kitchen aspirin EC 81 MG tablet Take 1 tablet (81 mg total) by mouth daily.   Marland Kitchen atorvastatin (LIPITOR) 40 MG tablet TAKE 1 TABLET (40 MG TOTAL) BY MOUTH DAILY.   Marland Kitchen ceFEPime (MAXIPIME) IVPB Inject 2 g into the vein every 8 (eight) hours.   . DULoxetine (CYMBALTA) 60 MG capsule TAKE 1 CAPSULE (60 MG TOTAL) BY MOUTH DAILY.   . folic acid (FOLVITE) 1 MG tablet Take 1 tablet (1 mg total) by mouth daily.   . Heparin Lock Flush (HEPARIN FLUSH) 10 UNIT/ML SOLN injection Inject into the vein. Give 3 cc Intravenous 5 times a day   . insulin glargine (LANTUS) 100 UNIT/ML Solostar Pen Inject 42 Units into the skin  2 (two) times daily.   . insulin lispro (HUMALOG) 100 UNIT/ML KwikPen Inject 20 Units into the skin 3 (three) times daily with meals.   . insulin lispro (HUMALOG) 100 UNIT/ML KwikPen Inject 5 Units into the skin 3 (three) times daily with meals.   . insulin lispro (HUMALOG) 100 UNIT/ML KwikPen Inject 5 Units into the skin at bedtime.   Marland Kitchen loperamide (IMODIUM A-D) 2 MG tablet Take 1 tablet (2 mg total) by mouth 4 (four) times daily as needed for diarrhea or loose stools.   Marland Kitchen losartan (COZAAR) 50 MG tablet TAKE 1 TABLET (50 MG TOTAL) BY MOUTH DAILY. PLEASE FILL AS A 90 DAY SUPPLY   . meloxicam (MOBIC) 7.5 MG tablet TAKE 1 TABLET (7.5 MG TOTAL) BY MOUTH DAILY.   . mirtazapine (REMERON) 15 MG tablet TAKE 1 TABLET (15 MG TOTAL) BY MOUTH AT BEDTIME.   . Multiple Vitamins-Minerals (THEREMS-M) TABS Take 400 mcg by mouth daily in the afternoon.   . nicotine (NICODERM CQ - DOSED IN MG/24 HR) 7 mg/24hr patch Place 1 patch (7 mg total) onto the skin daily.   . NON FORMULARY dial antibacterial soap liquid; topical Special Instructions: For left foot wound treatment as ordered.   . NON FORMULARY Diet Regular, Consistent Carbohydrate   . omeprazole (PRILOSEC) 40 MG capsule TAKE 1 CAPSULE (40 MG TOTAL) BY MOUTH DAILY.   Marland Kitchen oxyCODONE (OXY IR/ROXICODONE) 5 MG immediate release tablet Take 1 tablet (5 mg total) by mouth every 6 (six) hours as needed for up to 5 days for severe pain.   Marland Kitchen saccharomyces boulardii (FLORASTOR) 250 MG capsule Take 1 capsule (250 mg total) by mouth 2 (two) times daily.   . sodium chloride 0.9 % injection Inject into the vein. Normal Saline Flush (sodium chloride 0.9 % (flush)); syringe; - ;  1st Order:  amt: 5cc; injection Special Instructions: Flush IV with 5cc saline prior to IV ABO use. 5 Times Per Day 08:00 AM, 10:00 AM, 04:00 PM, 10:00 PM, 12:00 AM  2nd Order:  amt: 5cc; injection Special Instructions: Flush IV with 5cc saline after completion of IV ABO use. 5 Times Per Day 08:45  AM, 10:45 AM, 04:45 PM, 10:45 PM, 12:45 AM   . sodium hypochlorite (DAKIN'S 1/2 STRENGTH) external solution MOISTEN GAUZE WITH DAKINS SOLUTION THEN SQUEEZE OUT EXCESS AND APPLY THE GAUZE  INTO THE WOUND BED TO BE CHANGED 2 TIMES PER DAY   . vancomycin IVPB Inject 1,500 mg into the vein every 12 (twelve) hours. Indication:  Osteomyelitis  First Dose: No Last Day of Therapy:  12/27/2020 Labs - Sunday/Monday:  CBC/D, BMP, and vancomycin trough. Labs - Thursday:  BMP and vancomycin trough Labs - Every other week:  ESR and CRP Method of administration:Elastomeric Method of administration may be changed at the discretion of the patient and/or caregiver's ability to self-administer the medication ordered.   . [DISCONTINUED] amLODipine (NORVASC) 10 MG tablet Take 1 tablet (10 mg total) by mouth daily. Please fill as a 90 day supply   . [DISCONTINUED] glipiZIDE (GLUCOTROL) 5 MG tablet Take 1 tablet (5 mg total) by mouth daily before breakfast. Please fill as a 90 day supply (Patient taking differently: Take 5 mg by mouth daily before breakfast.)   . [DISCONTINUED] insulin aspart protamine - aspart (NOVOLOG 70/30 FLEXPEN RELION) (70-30) 100 UNIT/ML FlexPen Inject 0.2 mLs (20 Units total) into the skin 2 (two) times daily. 11/04/2020: switched to lantus  . [DISCONTINUED] loratadine (CLARITIN) 10 MG tablet Take 1 tablet (10 mg total) by mouth daily. Please fill as a 90 day supply (Patient taking differently: Take 10 mg by mouth daily.)    No facility-administered encounter medications on file as of 12/03/2020.     SIGNIFICANT DIAGNOSTIC EXAMS   PREVIOUS   11-14-20: left foot MRI:  1. Progressive deep skin ulceration along the medial and plantar aspects of the great toe with new osteomyelitis of the first proximal and distal phalanges. New 3.6 x 3.5 x 3.1 cm abscess centered in the first intermetatarsal space. 2. Increased now moderate first MTP joint effusion with new mild marrow edema in the first metatarsal  head, concerning for septic arthritis and early osteomyelitis. 3. Early osteomyelitis of the second metatarsal head which lies immediately adjacent to the abscess. New marrow edema within the second proximal phalanx is equivocal for osteomyelitis.  11-14-20: left tibia/fibula MRI 1. Circumferential soft tissue swelling of the lower leg is nonspecific but may reflect cellulitis given clinical history. No abscess or osteomyelitis  NO NEW EXAMS.   LABS REVIEWED PREVIOUS   10-28-20: hgb a1c 11.2  11-14-20: wbc 9.1; hgb 13.5 hct 40.0; mcv 88.1 plt 381; glucose 307; bun 8; creat 0.51; k+ 4.2; na++ 131; ca 84 GFR >60; liver normal albumin 2.8 11-16-20: wbc 13.3; hgb 12.4; hct  89.7 plt 357; glucose 368; bun 9; creat 0.58; k+ 3.8; na++ 131; ca 8.2 ca 8.8 GFR >60 11-25-20: glucose 309; bun 7; creat 0.51; k+ 4.3; na++ 135; ca 9.4 GFR >60 11-27-20: glucose 277; bun 8; creat 0.49; k+ 4.3; na++ 130 ca 9.2 GFR >60  NO NEW LABS   Review of Systems  Constitutional: Negative for malaise/fatigue.  Respiratory: Negative for cough and shortness of breath.   Cardiovascular: Negative for chest pain, palpitations and leg swelling.  Gastrointestinal: Negative for abdominal pain, constipation and heartburn.  Musculoskeletal: Negative for back pain, joint pain and myalgias.  Skin:       Metatarsal amputation   Neurological: Negative for dizziness.  Psychiatric/Behavioral: The patient is not nervous/anxious.      Physical Exam Constitutional:      General: He is not in acute distress.    Appearance: He is well-developed. He is not diaphoretic.  Neck:     Thyroid: No thyromegaly.  Cardiovascular:     Rate and Rhythm: Normal rate and regular rhythm.  Pulses: Normal pulses.     Heart sounds: Normal heart sounds.  Pulmonary:     Effort: Pulmonary effort is normal. No respiratory distress.     Breath sounds: Normal breath sounds.  Abdominal:     General: Bowel sounds are normal. There is no distension.      Palpations: Abdomen is soft.     Tenderness: There is no abdominal tenderness.  Musculoskeletal:     Cervical back: Neck supple.     Right lower leg: No edema.     Left lower leg: No edema.     Comments: Is able to move all extremities Is status post left metatarsal amputation    Lymphadenopathy:     Cervical: No cervical adenopathy.  Skin:    General: Skin is warm and dry.  Neurological:     Mental Status: He is alert and oriented to person, place, and time.  Psychiatric:        Mood and Affect: Mood normal.     ASSESSMENT/ PLAN:  TODAY  1. Acute hematogenous osteomyelitis unspecified site:  Will stop oxycodone Will begin tylenol cr 650 mg every 6 hours will monitor his status.     Ok Edwards NP [facility]  Contact [phone] Monday through Friday 8am- 5pm  After hours call [phone]

## 2020-12-04 ENCOUNTER — Telehealth: Payer: Self-pay

## 2020-12-04 NOTE — Telephone Encounter (Signed)
Received call from Assunta Curtis, [facility] of nursing at [facility]. She states patient is eager to be discharged this weekend and is wondering if patient can be switched from IV antibiotics to oral antibiotics. RN spoke with Dr. Tommy Medal and received verbal orders.   RN relayed verbal orders per Dr. Tommy Medal to Assunta Curtis to pull PICC, start ciprofloxacin 500mg  PO twice a day and doxycycline 100mg  PO twice a day through 12/27/20. Orders repeated and verified. Abigail Butts states these orders will be fulfilled by a Nurse Practitioner at [facility].   Patient scheduled for follow up with Dr. Tommy Medal on 12/27/20, appointment time relayed to [facility].   Beryle Flock, RN

## 2020-12-05 ENCOUNTER — Other Ambulatory Visit: Payer: Self-pay

## 2020-12-05 ENCOUNTER — Non-Acute Institutional Stay (SKILLED_NURSING_FACILITY): Payer: Self-pay | Admitting: Adult Health

## 2020-12-05 ENCOUNTER — Other Ambulatory Visit: Payer: Self-pay | Admitting: Adult Health

## 2020-12-05 ENCOUNTER — Encounter: Payer: Self-pay | Admitting: Adult Health

## 2020-12-05 DIAGNOSIS — I10 Essential (primary) hypertension: Secondary | ICD-10-CM

## 2020-12-05 DIAGNOSIS — F10982 Alcohol use, unspecified with alcohol-induced sleep disorder: Secondary | ICD-10-CM

## 2020-12-05 DIAGNOSIS — E785 Hyperlipidemia, unspecified: Secondary | ICD-10-CM

## 2020-12-05 DIAGNOSIS — I700 Atherosclerosis of aorta: Secondary | ICD-10-CM

## 2020-12-05 DIAGNOSIS — M2550 Pain in unspecified joint: Secondary | ICD-10-CM

## 2020-12-05 DIAGNOSIS — K2100 Gastro-esophageal reflux disease with esophagitis, without bleeding: Secondary | ICD-10-CM

## 2020-12-05 DIAGNOSIS — F17200 Nicotine dependence, unspecified, uncomplicated: Secondary | ICD-10-CM

## 2020-12-05 DIAGNOSIS — Z89432 Acquired absence of left foot: Secondary | ICD-10-CM

## 2020-12-05 DIAGNOSIS — M8600 Acute hematogenous osteomyelitis, unspecified site: Secondary | ICD-10-CM

## 2020-12-05 MED ORDER — INSULIN GLARGINE 100 UNIT/ML SOLOSTAR PEN
42.0000 [IU] | PEN_INJECTOR | Freq: Two times a day (BID) | SUBCUTANEOUS | 0 refills | Status: DC
  Filled 2020-12-05 – 2020-12-23 (×2): qty 30, 36d supply, fill #0

## 2020-12-05 MED ORDER — MIRTAZAPINE 15 MG PO TABS
ORAL_TABLET | Freq: Every day | ORAL | 0 refills | Status: DC
  Filled 2020-12-05 – 2020-12-23 (×2): qty 30, 30d supply, fill #0

## 2020-12-05 MED ORDER — INSULIN LISPRO (1 UNIT DIAL) 100 UNIT/ML (KWIKPEN)
5.0000 [IU] | PEN_INJECTOR | Freq: Every day | SUBCUTANEOUS | 0 refills | Status: DC
  Filled 2020-12-05: qty 15, 300d supply, fill #0

## 2020-12-05 MED ORDER — ATORVASTATIN CALCIUM 40 MG PO TABS
ORAL_TABLET | Freq: Every day | ORAL | 0 refills | Status: DC
  Filled 2020-12-05 – 2020-12-23 (×2): qty 30, 30d supply, fill #0

## 2020-12-05 MED ORDER — OMEPRAZOLE 40 MG PO CPDR
DELAYED_RELEASE_CAPSULE | Freq: Every day | ORAL | 0 refills | Status: DC
  Filled 2020-12-05 – 2020-12-23 (×2): qty 30, 30d supply, fill #0

## 2020-12-05 MED ORDER — INSULIN LISPRO (1 UNIT DIAL) 100 UNIT/ML (KWIKPEN)
5.0000 [IU] | PEN_INJECTOR | Freq: Three times a day (TID) | SUBCUTANEOUS | 0 refills | Status: DC
  Filled 2020-12-05: qty 15, 100d supply, fill #0

## 2020-12-05 MED ORDER — LOSARTAN POTASSIUM 50 MG PO TABS
ORAL_TABLET | ORAL | 0 refills | Status: DC
  Filled 2020-12-05 – 2020-12-23 (×2): qty 30, 30d supply, fill #0

## 2020-12-05 MED ORDER — MELOXICAM 7.5 MG PO TABS
ORAL_TABLET | Freq: Every day | ORAL | 0 refills | Status: DC
  Filled 2020-12-05 – 2021-01-28 (×2): qty 30, 30d supply, fill #0

## 2020-12-05 MED ORDER — INSULIN LISPRO (1 UNIT DIAL) 100 UNIT/ML (KWIKPEN)
20.0000 [IU] | PEN_INJECTOR | Freq: Three times a day (TID) | SUBCUTANEOUS | 0 refills | Status: DC
  Filled 2020-12-05: qty 15, 20d supply, fill #0
  Filled 2020-12-05 – 2020-12-23 (×2): qty 15, 25d supply, fill #0

## 2020-12-05 MED ORDER — ALBUTEROL SULFATE HFA 108 (90 BASE) MCG/ACT IN AERS
2.0000 | INHALATION_SPRAY | Freq: Four times a day (QID) | RESPIRATORY_TRACT | 0 refills | Status: DC | PRN
  Filled 2020-12-05 – 2021-06-06 (×2): qty 18, 25d supply, fill #0

## 2020-12-05 MED ORDER — FOLIC ACID 1 MG PO TABS: 1.0000 mg | ORAL_TABLET | Freq: Every day | ORAL | 0 refills | Status: DC

## 2020-12-05 NOTE — Progress Notes (Signed)
Location:   [facility] Room Number: 174 Place of Service:  SNF (31)    CODE STATUS: full code   Allergies  Allergen Reactions  . Bee Venom Anaphylaxis  . Lactose Intolerance (Gi) Other (See Comments)    GI upset    Chief Complaint  Patient presents with  . Discharge Note    HPI:  He is being discharged to home; will need his medications; wheelchair and to follow up with his medical provider. Will need to pull out his picc line prior to his discharge. His abt's; insulin and medications are being sent home with him. He had had a transmetatarsal amputation; was admitted to this facility for abt and wound management. He will not have home health due to payer source.    Past Medical History:  Diagnosis Date  . Barrett's esophagus dx 2016  . Bronchitis   . Depression   . Diabetes ([place])   . ED (erectile dysfunction)   . GERD (gastroesophageal reflux disease)   . Hepatitis C   . Hiatal hernia 10/2014   3cm  . Neuropathy   . S/P transmetatarsal amputation of foot, left ([place]) 11/28/2020    Past Surgical History:  Procedure Laterality Date  . AMPUTATION Left 11/15/2020   Procedure: LEFT TRANSMETATARSAL AMPUTATION;  Surgeon: Newt Minion, MD;  Location: [place];  Service: Orthopedics;  Laterality: Left;  . APPENDECTOMY    . COLONOSCOPY N/A 02/16/2014   Procedure: COLONOSCOPY;  Surgeon: Gatha Mayer, MD;  Location: [facility];  Service: Endoscopy;  Laterality: N/A;  . COLONOSCOPY    . TONSILLECTOMY      Social History   Socioeconomic History  . Marital status: Single    Spouse name: Not on file  . Number of children: Not on file  . Years of education: Not on file  . Highest education level: Not on file  Occupational History  . Not on file  Tobacco Use  . Smoking status: Current Every Day Smoker    Packs/day: 0.50    Years: 35.00    Pack years: 17.50    Types: Cigarettes  . Smokeless tobacco: Never Used  . Tobacco comment: none x 3 weeks as  of 05/22/2020  Vaping Use  . Vaping Use: Never used  Substance and Sexual Activity  . Alcohol use: Not Currently    Comment: daily  . Drug use: No  . Sexual activity: Not on file  Other Topics Concern  . Not on file  Social History Narrative  . Not on file   Social Determinants of Health   Financial Resource Strain: Not on file  Food Insecurity: Not on file  Transportation Needs: Not on file  Physical Activity: Not on file  Stress: Not on file  Social Connections: Not on file  Intimate Partner Violence: Not on file   Family History  Problem Relation Age of Onset  . Cancer Mother        breast  . Diabetes Mother   . Heart disease Maternal Grandfather   . Pancreatic cancer Paternal Grandmother   . Stroke Father   . Alcohol abuse Father   . Pancreatic cancer Father   . Colon cancer Neg Hx   . Stomach cancer Neg Hx   . Rectal cancer Neg Hx   . Esophageal cancer Neg Hx     VITAL SIGNS BP (!) 152/84   Pulse 80   Temp 97.9 F (36.6 C)   Resp 18  Ht _0  (1.803 m)   Wt 163 lb 3.2 oz (74 kg)   SpO2 94%   BMI 22.76 kg/m   Patient's Medications  New Prescriptions   No medications on file  Previous Medications   ACETAMINOPHEN (TYLENOL) 325 MG TABLET    Take 2 tablets (650 mg total) by mouth every 6 (six) hours as needed for mild pain (or Fever >/= 101).   ALBUTEROL (VENTOLIN HFA) 108 (90 BASE) MCG/ACT INHALER    Inhale 2 puffs into the lungs every 6 (six) hours as needed for wheezing or shortness of breath.   ASPIRIN EC 81 MG TABLET    Take 1 tablet (81 mg total) by mouth daily.   ATORVASTATIN (LIPITOR) 40 MG TABLET    TAKE 1 TABLET (40 MG TOTAL) BY MOUTH DAILY.   CEFEPIME (MAXIPIME) IVPB    Inject 2 g into the vein every 8 (eight) hours.   DULOXETINE (CYMBALTA) 60 MG CAPSULE    TAKE 1 CAPSULE (60 MG TOTAL) BY MOUTH DAILY.   FOLIC ACID (FOLVITE) 1 MG TABLET    Take 1 tablet (1 mg total) by mouth daily.   HEPARIN LOCK FLUSH (HEPARIN FLUSH) 10 UNIT/ML SOLN INJECTION     Inject into the vein. Give 3 cc Intravenous 5 times a day   INSULIN GLARGINE (LANTUS) 100 UNIT/ML SOLOSTAR PEN    Inject 42 Units into the skin 2 (two) times daily.   INSULIN LISPRO (HUMALOG) 100 UNIT/ML KWIKPEN    Inject 20 Units into the skin 3 (three) times daily with meals.   INSULIN LISPRO (HUMALOG) 100 UNIT/ML KWIKPEN    Inject 5 Units into the skin 3 (three) times daily with meals.   INSULIN LISPRO (HUMALOG) 100 UNIT/ML KWIKPEN    Inject 5 Units into the skin at bedtime.   LOPERAMIDE (IMODIUM A-D) 2 MG TABLET    Take 1 tablet (2 mg total) by mouth 4 (four) times daily as needed for diarrhea or loose stools.   LOSARTAN (COZAAR) 50 MG TABLET    TAKE 1 TABLET (50 MG TOTAL) BY MOUTH DAILY. PLEASE FILL AS A 90 DAY SUPPLY   MELOXICAM (MOBIC) 7.5 MG TABLET    TAKE 1 TABLET (7.5 MG TOTAL) BY MOUTH DAILY.   MIRTAZAPINE (REMERON) 15 MG TABLET    TAKE 1 TABLET (15 MG TOTAL) BY MOUTH AT BEDTIME.   MULTIPLE VITAMINS-MINERALS (THEREMS-M) TABS    Take 400 mcg by mouth daily in the afternoon.   NICOTINE (NICODERM CQ - DOSED IN MG/24 HR) 7 MG/24HR PATCH    Place 1 patch (7 mg total) onto the skin daily.   NON FORMULARY    dial antibacterial soap liquid; topical Special Instructions: For left foot wound treatment as ordered.   NON FORMULARY    Diet Regular, Consistent Carbohydrate   OMEPRAZOLE (PRILOSEC) 40 MG CAPSULE    TAKE 1 CAPSULE (40 MG TOTAL) BY MOUTH DAILY.   SACCHAROMYCES BOULARDII (FLORASTOR) 250 MG CAPSULE    Take 1 capsule (250 mg total) by mouth 2 (two) times daily.   SODIUM CHLORIDE 0.9 % INJECTION    Inject into the vein. Normal Saline Flush (sodium chloride 0.9 % (flush)); syringe; - ;  1st Order:  amt: 5cc; injection Special Instructions: Flush IV with 5cc saline prior to IV ABO use. 5 Times Per Day 08:00 AM, 10:00 AM, 04:00 PM, 10:00 PM, 12:00 AM  2nd Order:  amt: 5cc; injection Special Instructions: Flush IV with 5cc saline after completion of IV ABO  use. 5 Times Per Day 08:45 AM,  10:45 AM, 04:45 PM, 10:45 PM, 12:45 AM   SODIUM HYPOCHLORITE (DAKIN'S 1/2 STRENGTH) EXTERNAL SOLUTION    MOISTEN GAUZE WITH DAKINS SOLUTION THEN SQUEEZE OUT EXCESS AND APPLY THE GAUZE INTO THE WOUND BED TO BE CHANGED 2 TIMES PER DAY   VANCOMYCIN IVPB    Inject 1,500 mg into the vein every 12 (twelve) hours. Indication:  Osteomyelitis  First Dose: No Last Day of Therapy:  12/27/2020 Labs - Sunday/Monday:  CBC/D, BMP, and vancomycin trough. Labs - Thursday:  BMP and vancomycin trough Labs - Every other week:  ESR and CRP Method of administration:Elastomeric Method of administration may be changed at the discretion of the patient and/or caregiver's ability to self-administer the medication ordered.  Modified Medications   No medications on file  Discontinued Medications   No medications on file     SIGNIFICANT DIAGNOSTIC EXAMS   PREVIOUS   11-14-20: left foot MRI:  1. Progressive deep skin ulceration along the medial and plantar aspects of the great toe with new osteomyelitis of the first proximal and distal phalanges. New 3.6 x 3.5 x 3.1 cm abscess centered in the first intermetatarsal space. 2. Increased now moderate first MTP joint effusion with new mild marrow edema in the first metatarsal head, concerning for septic arthritis and early osteomyelitis. 3. Early osteomyelitis of the second metatarsal head which lies immediately adjacent to the abscess. New marrow edema within the second proximal phalanx is equivocal for osteomyelitis.  11-14-20: left tibia/fibula MRI 1. Circumferential soft tissue swelling of the lower leg is nonspecific but may reflect cellulitis given clinical history. No abscess or osteomyelitis  NO NEW EXAMS.   LABS REVIEWED PREVIOUS   10-28-20: hgb a1c 11.2  11-14-20: wbc 9.1; hgb 13.5 hct 40.0; mcv 88.1 plt 381; glucose 307; bun 8; creat 0.51; k+ 4.2; na++ 131; ca 84 GFR >60; liver normal albumin 2.8 11-16-20: wbc 13.3; hgb 12.4; hct  89.7 plt 357; glucose 368; bun  9; creat 0.58; k+ 3.8; na++ 131; ca 8.2 ca 8.8 GFR >60 11-25-20: glucose 309; bun 7; creat 0.51; k+ 4.3; na++ 135; ca 9.4 GFR >60 11-27-20: glucose 277; bun 8; creat 0.49; k+ 4.3; na++ 130 ca 9.2 GFR >60  NO NEW LABS   Review of Systems  Constitutional: Negative for malaise/fatigue.  Respiratory: Negative for cough and shortness of breath.   Cardiovascular: Negative for chest pain, palpitations and leg swelling.  Gastrointestinal: Negative for abdominal pain, constipation and heartburn.  Musculoskeletal: Negative for back pain, joint pain and myalgias.  Skin: Negative.   Neurological: Negative for dizziness.  Psychiatric/Behavioral: The patient is not nervous/anxious.     Physical Exam Constitutional:      General: He is not in acute distress.    Appearance: He is well-developed. He is not diaphoretic.  Neck:     Thyroid: No thyromegaly.  Cardiovascular:     Rate and Rhythm: Normal rate and regular rhythm.     Pulses: Normal pulses.     Heart sounds: Normal heart sounds.  Pulmonary:     Effort: Pulmonary effort is normal. No respiratory distress.     Breath sounds: Normal breath sounds.  Abdominal:     General: Bowel sounds are normal. There is no distension.     Palpations: Abdomen is soft.     Tenderness: There is no abdominal tenderness.  Musculoskeletal:        General: Normal range of motion.     Cervical back:  Neck supple.     Right lower leg: No edema.     Left lower leg: No edema.     Comments: Status post metatarsal amputation   Lymphadenopathy:     Cervical: No cervical adenopathy.  Skin:    General: Skin is warm and dry.  Neurological:     Mental Status: He is alert and oriented to person, place, and time.  Psychiatric:        Mood and Affect: Mood normal.      ASSESSMENT/ PLAN:   Patient is being discharged with the following home health services:  Unable    Patient is being discharged with the following durable medical equipment:  Wheelchair with  cushion elevated leg rests brake extensions. To allow him to maintain his current level of independence with his adls which cannot be achieved with a walker crutches cane  He can self propel wheelchair   Patient has been advised to f/u with their PCP in 1-2 weeks to bring them up to date on their rehab stay.  Social services at facility was responsible for arranging this appointment.  Pt was provided with a 30 day supply of prescriptions for medications and refills must be obtained from their PCP.  For controlled substances, a more limited supply may be provided adequate until PCP appointment only.  [facility] His abt's; cbg machine lancets strips have been sent home with him also his routine medications.   Time spent with patient: 45 minutes: medications; dme.    Ok Edwards NP [facility]  Contact [phone] Monday through Friday 8am- 5pm  After hours call [phone]

## 2020-12-06 ENCOUNTER — Encounter: Payer: Self-pay | Admitting: Adult Health

## 2020-12-06 ENCOUNTER — Telehealth: Payer: Self-pay

## 2020-12-06 NOTE — Telephone Encounter (Signed)
Copied from [place] [id number]. Topic: General - Other >> Dec 05, 2020 11:49 AM Tessa Lerner A wrote: Reason for CRM:  Santiago Glad with [facility] has made contact regarding patient's discharge coordination  Santiago Glad would like to discuss patient's current medications as well as coordinate services for patient once they're discharged from [facility]  Please contact to further advise when possible

## 2020-12-06 NOTE — Telephone Encounter (Signed)
Opal Sidles would you be able to assist with this

## 2020-12-08 LAB — STOOL CULTURE: E coli, Shiga toxin Assay: NEGATIVE

## 2020-12-08 LAB — STOOL CULTURE REFLEX - CMPCXR

## 2020-12-08 LAB — STOOL CULTURE REFLEX - RSASHR

## 2020-12-09 NOTE — Telephone Encounter (Signed)
Call placed to [facility].  And she said that the patient has already been discharged and follow up appointment scheduled.

## 2020-12-10 ENCOUNTER — Encounter: Payer: Self-pay | Admitting: Physician Assistant

## 2020-12-10 ENCOUNTER — Ambulatory Visit (INDEPENDENT_AMBULATORY_CARE_PROVIDER_SITE_OTHER): Payer: Self-pay | Admitting: Orthopedic Surgery

## 2020-12-10 DIAGNOSIS — Z89432 Acquired absence of left foot: Secondary | ICD-10-CM

## 2020-12-10 NOTE — Telephone Encounter (Signed)
RN spoke to [place] at [facility], she states patient started his oral ciprofloxacin and doxycyline on 12/06/20.   Beryle Flock, RN

## 2020-12-10 NOTE — Progress Notes (Signed)
Office Visit Note   Patient: [name] [name]           Date of Birth: 02-06-1961           MRN: [id number] Visit Date: 12/10/2020              Requested by: [name], NP [address] [place],  [place] [address] PCP: [name], NP  Chief Complaint  Patient presents with  . Left Foot - Routine Post Op    11/15/20 left foot transmet amputation       HPI: Patient is a 60 year old gentleman who presents 4 weeks status post left foot transmetatarsal amputation he is currently on oral antibiotics.  Assessment & Plan: Visit Diagnoses:  1. History of amputation of foot, left ([place])     Plan: Patient has dependent edema recommended a extra-large shrinker for the foot to be worn around-the-clock he is given a prescription for biotech for extra-depth shoe custom orthotic spacer carbon plate and extra-large shrinker sock.  Follow-Up Instructions: Return in about 4 weeks (around 01/07/2021).   Ortho Exam  Patient is alert, oriented, no adenopathy, well-dressed, normal affect, normal respiratory effort. Examination patient does have dependent edema of the incision is well-healed.  With elevation the redness resolves there is no tenderness to palpation no drainage we will harvest the sutures today.  Imaging: No results found. No images are attached to the encounter.  Labs: Lab Results  Component Value Date   HGBA1C 11.2 (H) 10/28/2020   HGBA1C 12.1 (H) 09/23/2020   HGBA1C 6.1 (H) 05/13/2020   ESRSEDRATE 40 (H) 11/29/2020   ESRSEDRATE 77 (H) 11/14/2020   ESRSEDRATE 85 (H) 11/01/2020   CRP 0.8 11/29/2020   CRP 7.7 (H) 11/01/2020   CRP 13.0 (H) 10/31/2020   LABURIC 1.1 (L) 05/16/2020   REPTSTATUS 11/18/2020 FINAL 11/15/2020   GRAMSTAIN  11/15/2020    ABUNDANT WBC PRESENT, PREDOMINANTLY PMN FEW GRAM POSITIVE COCCI RARE GRAM NEGATIVE RODS    CULT  11/15/2020    ABUNDANT PSEUDOMONAS AERUGINOSA ABUNDANT STAPHYLOCOCCUS EPIDERMIDIS NO ANAEROBES ISOLATED Performed  at [facility] [address] [place], [place] [address]    LABORGA PSEUDOMONAS AERUGINOSA 11/15/2020   LABORGA STAPHYLOCOCCUS EPIDERMIDIS 11/15/2020     Lab Results  Component Value Date   ALBUMIN 2.8 (L) 11/15/2020   ALBUMIN 2.8 (L) 11/14/2020   ALBUMIN 3.4 (L) 11/13/2020    Lab Results  Component Value Date   MG 1.9 11/21/2020   MG 2.0 10/30/2020   MG 2.5 (H) 05/19/2020   Lab Results  Component Value Date   VD25OH 33 10/09/2014    No results found for: PREALBUMIN CBC EXTENDED Latest Ref Rng & Units 11/29/2020 11/21/2020 11/17/2020  WBC 4.0 - 10.5 K/uL 8.6 10.5 9.4  RBC 4.22 - 5.81 MIL/uL 4.68 4.15(L) 4.31  HGB 13.0 - 17.0 g/dL 13.8 12.3(L) 12.7(L)  HCT 39.0 - 52.0 % 42.5 37.7(L) 38.5(L)  PLT 150 - 400 K/uL 288 306 360  NEUTROABS 1.7 - 7.7 K/uL 4.6 - -  LYMPHSABS 0.7 - 4.0 K/uL 2.3 - -     There is no height or weight on file to calculate BMI.  Orders:  No orders of the defined types were placed in this encounter.  No orders of the defined types were placed in this encounter.    Procedures: No procedures performed  Clinical Data: No additional findings.  ROS:  All other systems negative, except as noted in the HPI. Review  of Systems  Objective: Vital Signs: There were no vitals taken for this visit.  Specialty Comments:  No specialty comments available.  PMFS History: Patient Active Problem List   Diagnosis Date Noted  . S/P transmetatarsal amputation of foot, left ([place]) 11/28/2020  . Hypertension associated with type 2 diabetes mellitus ([place]) 11/28/2020  . Type 2 diabetes mellitus with diabetic neuropathy, unspecified ([place]) 11/28/2020  . Hyperlipidemia associated with type 2 diabetes mellitus ([place]) 11/28/2020  . GERD without esophagitis 11/28/2020  . Major depression, recurrent, chronic ([place]) 11/28/2020  . Aortic atherosclerosis ([place]) 11/28/2020  . Diabetic neuropathy ([place]) 11/28/2020  . Osteomyelitis ([place]) 11/14/2020  . Sepsis ([place])  10/28/2020  . Community acquired pneumonia of right lower lobe of lung   . Elevated LFTs   . Hypophosphatemia   . Acute metabolic encephalopathy   . Acute maxillary sinusitis   . Depression   . Physical deconditioning   . Pneumonia 05/14/2020  . DTs (delirium tremens) ([place]) 05/13/2020  . Hyponatremia 05/13/2020  . Hypokalemia 05/13/2020  . SIRS (systemic inflammatory response syndrome) ([place]) 05/13/2020  . Sepsis with acute hypoxic respiratory failure without septic shock ([place])   . Alcohol abuse with alcohol-induced mood disorder ([place]) 12/17/2017  . Hereditary hemochromatosis ([place]) 11/23/2017  . 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 Essential hypertension 04/02/2017  . Hypogonadism male 08/13/2016  . ETOH abuse 06/22/2016  . Low serum testosterone level 01/08/2016  . Type 2 diabetes mellitus with other specified complication ([place]) 97/58/8325  . Erectile dysfunction 09/12/2015  . Hepatic cirrhosis ([place]) 01/10/2015  . Chronic hepatitis C without hepatic coma ([place]) 10/31/2014  . Tobacco use disorder 08/03/2013  . Diabetes type 2, controlled ([place]) 04/27/2013   Past Medical History:  Diagnosis Date  . Barrett's esophagus dx 2016  . Bronchitis   . Chronic hepatitis C without hepatic coma ([place]) 10/31/2014  . Depression   . Diabetes ([place])   . ED (erectile dysfunction)   . GERD (gastroesophageal reflux disease)   . Hepatitis C   . Hiatal hernia 10/2014   3cm  . Neuropathy   . S/P transmetatarsal amputation of foot, left ([place]) 11/28/2020    Family History  Problem Relation Age of Onset  . Cancer Mother        breast  . Diabetes Mother   . Heart disease Maternal Grandfather   . Pancreatic cancer Paternal Grandmother   . Stroke Father   . Alcohol abuse Father   . Pancreatic cancer Father   . Colon cancer Neg Hx   . Stomach cancer Neg Hx   . Rectal cancer Neg Hx   . Esophageal cancer Neg Hx     Past Surgical History:  Procedure Laterality Date  .  AMPUTATION Left 11/15/2020   Procedure: LEFT TRANSMETATARSAL AMPUTATION;  Surgeon: Newt Minion, MD;  Location: [facility];  Service: Orthopedics;  Laterality: Left;  . APPENDECTOMY    . COLONOSCOPY N/A 02/16/2014   Procedure: COLONOSCOPY;  Surgeon: Gatha Mayer, MD;  [facility];  Service: Endoscopy;  Laterality: N/A;  . COLONOSCOPY    . TONSILLECTOMY     Social History   Occupational History  . Not on file  Tobacco Use  . Smoking status: Current Every Day Smoker    Packs/day: 0.50    Years: 35.00    Pack years: 17.50    Types: Cigarettes  . Smokeless tobacco: Never Used  . Tobacco comment: none x 3 weeks as of 05/22/2020  Vaping Use  .  Vaping Use: Never used  Substance and Sexual Activity  . Alcohol use: Not Currently    Comment: daily  . Drug use: No  . Sexual activity: Not on file

## 2020-12-11 ENCOUNTER — Telehealth: Payer: Self-pay

## 2020-12-11 NOTE — Telephone Encounter (Signed)
Call placed to patient regarding follow up appointment.  He said that he has been having problems with his bowels which may be due to antibiotics. He would like to see a provider sooner than his scheduled appointment with Ms Raul Del, NP on 01/07/2021.  He was willing to be seen at another clinic.   Scheduled him at [facility] with Juluis Mire, NP 12/17/2020.  Also provided him with the location and hours for the mobile medical unit tomorrow.  Explained to him that the hours are 0900-1900, closed from 1300-1400 and patients are seen first come, first served. He said he understood and would call to cancel the appointment 12/17/2020 if he is seen at [facility] tomorrow.

## 2020-12-16 ENCOUNTER — Other Ambulatory Visit: Payer: Self-pay

## 2020-12-17 ENCOUNTER — Ambulatory Visit (INDEPENDENT_AMBULATORY_CARE_PROVIDER_SITE_OTHER): Payer: Medicaid Other | Admitting: Primary Care

## 2020-12-17 ENCOUNTER — Encounter (INDEPENDENT_AMBULATORY_CARE_PROVIDER_SITE_OTHER): Payer: Self-pay | Admitting: Primary Care

## 2020-12-17 ENCOUNTER — Other Ambulatory Visit: Payer: Self-pay

## 2020-12-17 VITALS — BP 156/88 | HR 89 | Temp 97.5°F | Ht 71.0 in | Wt 167.4 lb

## 2020-12-17 DIAGNOSIS — R197 Diarrhea, unspecified: Secondary | ICD-10-CM

## 2020-12-17 DIAGNOSIS — R1084 Generalized abdominal pain: Secondary | ICD-10-CM | POA: Diagnosis not present

## 2020-12-17 NOTE — Patient Instructions (Signed)
Chronic Diarrhea Chronic diarrhea is a condition in which a person passes frequent loose and watery stools for 4 weeks or longer. Non-chronic diarrhea usually lasts for only 2-3 days. Diarrhea can cause a person to feel weak and dehydrated. Dehydration can make the person tired and thirsty. It can also cause a dry mouth, decreased urination, and dark yellow urine. Diarrhea is a sign of an underlying problem, such as:  Infection.  Side effects of medicines.  Problems digesting something in your diet, such as milk products if you have lactose intolerance.  Conditions such as celiac disease, irritable bowel syndrome (IBS), or inflammatory bowel disease (IBD). If you have chronic diarrhea, make sure you treat it as told by your health care provider. Follow these instructions at home: Medicines  Take over-the-counter and prescription medicines only as told by your health care provider.  If you were prescribed an antibiotic medicine, take it as told by your health care provider. Do not stop taking the antibiotic even if you start to feel better. Eating and drinking  Follow instructions from your health care provider about what to eat and drink. You may have to: ? Avoid foods that trigger diarrhea for you. ? Take an oral rehydration solution (ORS). This is a drink that keeps you hydrated. It can be found at pharmacies and retail stores. ? Drink clear fluids, such as water, diluted fruit juice, and low-calorie sports drinks. You can also get fluids by sucking on ice chips. ? Drink enough fluid to keep your urine pale yellow. This will help you avoid dehydration. ? Eat small amounts of bland foods that are easy to digest as you are able. These foods include bananas, applesauce, rice, lean meats, toast, and crackers. ? Avoid spicy or fatty foods. ? Avoid foods and beverages that contain a lot of sugar or caffeine.  Do not drink alcohol if: ? Your health care provider tells you not to  drink. ? You are pregnant, may be pregnant, or are planning to become pregnant.  If you drink alcohol: ? Limit how much you use to:  0-1 drink a day for women.  0-2 drinks a day for men. ? Be aware of how much alcohol is in your drink. In the [place] one drink equals one 12 oz bottle of beer (355 mL), one 5 oz glass of wine (148 mL), or one 1 oz glass of hard liquor (44 mL).   General instructions  Wash your hands often and after each diarrhea episode. Use soap and water. If soap and water are not available, use hand sanitizer.  Make sure that all people in your household wash their hands well and often.  Rest as told by your health care provider.  Watch your condition for any changes.  Take a warm bath to relieve any burning or pain from frequent diarrhea episodes.  Keep all follow-up visits as told by your health care provider. This is important.   Contact a health care provider if:  You have a fever.  Your diarrhea gets worse or does not get better.  You have new symptoms.  You cannot drink fluid without vomiting.  You feel light-headed or dizzy.  You have a headache.  You have muscle cramps.  You have severe pain in the rectum. Get help right away if:  You have vomiting that does not go away.  You have chest pain.  You feel very weak or you faint.  You have bloody or black stools, or stools that look   like tar.  You have severe pain, cramping, or bloating in your abdomen, or pain that stays in one place.  You have trouble breathing or you are breathing very quickly.  Your heart is beating very quickly.  Your skin feels cold and clammy.  You feel confused.  You have a severe headache.  You have signs or symptoms of dehydration, such as: ? Dark urine, very little urine, or no urine. ? Cracked lips. ? Dry mouth. ? Sunken eyes. ? Sleepiness. ? Weakness. These symptoms may represent a serious problem that is an emergency. Do not wait to see if the  symptoms will go away. Get medical help right away. Call your local emergency services (911 in the [place] Do not drive yourself to the hospital. Summary  Chronic diarrhea is a condition in which a person passes frequent loose and watery stools for 4 weeks or longer.  Diarrhea is a sign of an underlying problem.  Make sure you treat your diarrhea as told by your health care provider.  Drink enough fluid to keep your urine pale yellow. This will help you avoid dehydration.  Wash your hands often and after each diarrhea episode. If soap and water are not available, use hand sanitizer. This information is not intended to replace advice given to you by your health care provider. Make sure you discuss any questions you have with your health care provider. Document Revised: 02/13/2019 Document Reviewed: 02/13/2019 Elsevier Patient Education  2021 [employer].  

## 2020-12-17 NOTE — Progress Notes (Signed)
Acute Office Visit  Subjective:    Patient ID: [name] [name], male    DOB: [birth year], 60 y.o.   MRN: [id number]  Chief Complaint  Patient presents with  . New Patient (Initial Visit)    Following up from SNF    HPI [name] is a 60 year old is male patient  ( Primary PCP is Ms. Flemming at [facility])  who presents for an acute visit.  He has an extensive history with osteomyelitis amputation done by Dr. Sharol Given and an infection occurred and had to be followed by infectious disease Dr. Drucilla Schmidt after discharge from the hospital he was recommended to go to a assisted living for physical therapy so he may return back home independently.  His major concern is having diarrhea he is on able to hold his bowels.  This  has been present since he has started doxycycline 100 mg twice daily and Cipro 500 mg twice a day both prescribed by infectious disease.  sent he has to eat first May take medication he will have GI upset Lactobacillus   Past Medical History:  Diagnosis Date  . Barrett's esophagus dx 2016  . Bronchitis   . Chronic hepatitis C without hepatic coma ([place]) 10/31/2014  . Depression   . Diabetes ([place])   . ED (erectile dysfunction)   . GERD (gastroesophageal reflux disease)   . Hepatitis C   . Hiatal hernia 10/2014   3cm  . Neuropathy   . S/P transmetatarsal amputation of foot, left ([place]) 11/28/2020    Past Surgical History:  Procedure Laterality Date  . AMPUTATION Left 11/15/2020   Procedure: LEFT TRANSMETATARSAL AMPUTATION;  Surgeon: Newt Minion, MD;  Location: [facility];  Service: Orthopedics;  Laterality: Left;  . APPENDECTOMY    . COLONOSCOPY N/A 02/16/2014   Procedure: COLONOSCOPY;  Surgeon: Gatha Mayer, MD;  Location[facility];  Service: Endoscopy;  Laterality: N/A;  . COLONOSCOPY    . TONSILLECTOMY      Family History  Problem Relation Age of Onset  . Cancer Mother        breast  . Diabetes Mother   . Heart disease Maternal Grandfather   . Pancreatic  cancer Paternal Grandmother   . Stroke Father   . Alcohol abuse Father   . Pancreatic cancer Father   . Colon cancer Neg Hx   . Stomach cancer Neg Hx   . Rectal cancer Neg Hx   . Esophageal cancer Neg Hx     Social History   Socioeconomic History  . Marital status: Single    Spouse name: Not on file  . Number of children: Not on file  . Years of education: Not on file  . Highest education level: Not on file  Occupational History  . Not on file  Tobacco Use  . Smoking status: Current Every Day Smoker    Packs/day: 0.50    Years: 35.00    Pack years: 17.50    Types: Cigarettes  . Smokeless tobacco: Never Used  . Tobacco comment: none x 3 weeks as of 05/22/2020  Vaping Use  . Vaping Use: Never used  Substance and Sexual Activity  . Alcohol use: Not Currently    Comment: daily  . Drug use: No  . Sexual activity: Not on file  Other Topics Concern  . Not on file  Social History Narrative  . Not on file   Social Determinants of Health   Financial Resource Strain: Not on file  Food Insecurity: Not on file  Transportation Needs: Not on file  Physical Activity: Not on file  Stress: Not on file  Social Connections: Not on file  Intimate Partner Violence: Not on file    Outpatient Medications Prior to Visit  Medication Sig Dispense Refill  . acetaminophen (TYLENOL) 325 MG tablet Take 2 tablets (650 mg total) by mouth every 6 (six) hours as needed for mild pain (or Fever >/= 101).    Marland Kitchen albuterol (VENTOLIN HFA) 108 (90 Base) MCG/ACT inhaler Inhale 2 puffs into the lungs every 6 (six) hours as needed for wheezing or shortness of breath. 18 g 0  . aspirin EC 81 MG tablet Take 1 tablet (81 mg total) by mouth daily. 30 tablet 11  . atorvastatin (LIPITOR) 40 MG tablet TAKE 1 TABLET (40 MG TOTAL) BY MOUTH DAILY. 30 tablet 0  . DULoxetine (CYMBALTA) 60 MG capsule TAKE 1 CAPSULE (60 MG TOTAL) BY MOUTH DAILY. 90 capsule 1  . folic acid (FOLVITE) 1 MG tablet Take 1 tablet (1 mg  total) by mouth daily. 30 tablet 0  . insulin glargine (LANTUS) 100 UNIT/ML Solostar Pen Inject 42 Units into the skin 2 (two) times daily. 30 mL 0  . insulin lispro (HUMALOG) 100 UNIT/ML KwikPen inject 22 units 3 times daily add 5 units if blood glucose is greater than 150 15 mL 0  . insulin lispro (HUMALOG) 100 UNIT/ML KwikPen Inject 5 Units into the skin 3 (three) times daily with meals. 15 mL 0  . insulin lispro (HUMALOG) 100 UNIT/ML KwikPen Inject 5 Units into the skin at bedtime. 15 mL 0  . loperamide (IMODIUM A-D) 2 MG tablet Take 1 tablet (2 mg total) by mouth 4 (four) times daily as needed for diarrhea or loose stools. 60 tablet 3  . losartan (COZAAR) 50 MG tablet TAKE 1 TABLET (50 MG TOTAL) BY MOUTH DAILY. PLEASE FILL AS A 90 DAY SUPPLY 30 tablet 0  . meloxicam (MOBIC) 7.5 MG tablet TAKE 1 TABLET (7.5 MG TOTAL) BY MOUTH DAILY. 30 tablet 0  . mirtazapine (REMERON) 15 MG tablet TAKE 1 TABLET (15 MG TOTAL) BY MOUTH AT BEDTIME. 30 tablet 0  . Multiple Vitamins-Minerals (THEREMS-M) TABS Take 400 mcg by mouth daily in the afternoon.    . nicotine (NICODERM CQ - DOSED IN MG/24 HR) 7 mg/24hr patch Place 1 patch (7 mg total) onto the skin daily. 28 patch 0  . NON FORMULARY dial antibacterial soap liquid; topical Special Instructions: For left foot wound treatment as ordered.    . NON FORMULARY Diet Regular, Consistent Carbohydrate    . omeprazole (PRILOSEC) 40 MG capsule TAKE 1 CAPSULE (40 MG TOTAL) BY MOUTH DAILY. 30 capsule 0  . saccharomyces boulardii (FLORASTOR) 250 MG capsule Take 1 capsule (250 mg total) by mouth 2 (two) times daily.    . sodium chloride 0.9 % injection Inject into the vein. Normal Saline Flush (sodium chloride 0.9 % (flush)); syringe; - ;  1st Order:  amt: 5cc; injection Special Instructions: Flush IV with 5cc saline prior to IV ABO use. 5 Times Per Day 08:00 AM, 10:00 AM, 04:00 PM, 10:00 PM, 12:00 AM  2nd Order:  amt: 5cc; injection Special Instructions: Flush IV  with 5cc saline after completion of IV ABO use. 5 Times Per Day 08:45 AM, 10:45 AM, 04:45 PM, 10:45 PM, 12:45 AM    . sodium hypochlorite (DAKIN'S 1/2 STRENGTH) external solution MOISTEN GAUZE WITH DAKINS SOLUTION THEN SQUEEZE OUT EXCESS AND APPLY THE  GAUZE INTO THE WOUND BED TO BE CHANGED 2 TIMES PER DAY 473 mL 2  . Heparin Lock Flush (HEPARIN FLUSH) 10 UNIT/ML SOLN injection Inject into the vein. Give 3 cc Intravenous 5 times a day (Patient not taking: Reported on 12/17/2020)    . ceFEPime (MAXIPIME) IVPB Inject 2 g into the vein every 8 (eight) hours.    . vancomycin IVPB Inject 1,500 mg into the vein every 12 (twelve) hours. Indication:  Osteomyelitis  First Dose: No Last Day of Therapy:  12/27/2020 Labs - Sunday/Monday:  CBC/D, BMP, and vancomycin trough. Labs - Thursday:  BMP and vancomycin trough Labs - Every other week:  ESR and CRP Method of administration:Elastomeric Method of administration may be changed at the discretion of the patient and/or caregiver's ability to self-administer the medication ordered. 74 Units 0   No facility-administered medications prior to visit.    Allergies  Allergen Reactions  . Bee Venom Anaphylaxis  . Lactose Intolerance (Gi) Other (See Comments)    GI upset    Review of Systems  Gastrointestinal: Positive for abdominal pain, diarrhea, nausea and vomiting.  Psychiatric/Behavioral: Positive for sleep disturbance.       Get up at 12:30 and 3:30 to use the BR  All other systems reviewed and are negative.      Objective:    Physical Exam Vitals reviewed.  Constitutional:      Appearance: Normal appearance. He is normal weight.  HENT:     Head: Normocephalic.     Right Ear: Tympanic membrane and external ear normal.     Left Ear: Tympanic membrane and external ear normal.     Nose: Nose normal.  Eyes:     Pupils: Pupils are equal, round, and reactive to light.  Cardiovascular:     Rate and Rhythm: Normal rate and regular rhythm.   Pulmonary:     Effort: Pulmonary effort is normal.     Breath sounds: Normal breath sounds.  Abdominal:     General: Bowel sounds are normal.     Palpations: Abdomen is soft.  Musculoskeletal:        General: Normal range of motion.     Cervical back: Normal range of motion and neck supple.  Skin:    General: Skin is warm and dry.  Neurological:     Mental Status: He is alert and oriented to person, place, and time.  Psychiatric:        Mood and Affect: Mood normal.        Behavior: Behavior normal.        Thought Content: Thought content normal.        Judgment: Judgment normal.     BP (!) 156/88 (BP Location: Right Arm, Patient Position: Sitting, Cuff Size: Normal)   Pulse 89   Temp (!) 97.5 F (36.4 C) (Temporal)   Ht 5' 11"  (1.803 m)   Wt 167 lb 6.4 oz (75.9 kg)   SpO2 98%   BMI 23.35 kg/m  Wt Readings from Last 3 Encounters:  12/17/20 167 lb 6.4 oz (75.9 kg)  12/05/20 163 lb 3.2 oz (74 kg)  12/03/20 163 lb 3.2 oz (74 kg)    Health Maintenance Due  Topic Date Due  . FOOT EXAM  11/24/2018    There are no preventive care reminders to display for this patient.   Lab Results  Component Value Date   TSH 3.566 05/16/2020   Lab Results  Component Value Date   WBC 8.6 11/29/2020  HGB 13.8 11/29/2020   HCT 42.5 11/29/2020   MCV 90.8 11/29/2020   PLT 288 11/29/2020   Lab Results  Component Value Date   NA 134 (L) 12/02/2020   K 3.9 12/02/2020   CO2 24 12/02/2020   GLUCOSE 213 (H) 12/02/2020   BUN 7 12/02/2020   CREATININE 0.56 (L) 12/02/2020   BILITOT 0.8 11/15/2020   ALKPHOS 71 11/15/2020   AST 26 11/15/2020   ALT 22 11/15/2020   PROT 7.3 11/15/2020   ALBUMIN 2.8 (L) 11/15/2020   CALCIUM 9.6 12/02/2020   ANIONGAP 8 12/02/2020   Lab Results  Component Value Date   CHOL 119 06/20/2020   Lab Results  Component Value Date   HDL 40 06/20/2020   Lab Results  Component Value Date   LDLCALC 62 06/20/2020   Lab Results  Component Value Date    TRIG 88 06/20/2020   Lab Results  Component Value Date   CHOLHDL 3.0 06/20/2020   Lab Results  Component Value Date   HGBA1C 11.2 (H) 10/28/2020       Assessment & Plan:  Traycen was seen today for new patient (initial visit).  Diagnoses and all orders for this visit:  Generalized abdominal pain Abdominal pain and discomfort with nausea vomiting diarrhea underlying causes are doxycycline.  And Cipro both taken twice a day.  Side effects of both medications is GI upset we discussed ways to maintain and stay hydrated and eat frequently  Diarrhea, unspecified type Diarrhea is secondary to 2 types of antibiotics attics and not taking them with a full stomach we discussed eating more before taking medication and discuss lactobacillus found in yogurt can help with diarrhea, abdominal pain, diarrhea and this will help natural back in his GI system.  He also can use Imodium A-D if needed he already  has that hand.  No orders of the defined types were placed in this encounter.    Kerin Perna, NP

## 2020-12-17 NOTE — Progress Notes (Signed)
Nocturnal diarrhea  Unable to hold bowels

## 2020-12-20 ENCOUNTER — Ambulatory Visit: Payer: Self-pay | Admitting: Nurse Practitioner

## 2020-12-23 ENCOUNTER — Ambulatory Visit: Payer: Self-pay | Admitting: Family

## 2020-12-23 ENCOUNTER — Other Ambulatory Visit: Payer: Self-pay | Admitting: Nurse Practitioner

## 2020-12-23 ENCOUNTER — Other Ambulatory Visit: Payer: Self-pay

## 2020-12-23 DIAGNOSIS — IMO0002 Reserved for concepts with insufficient information to code with codable children: Secondary | ICD-10-CM

## 2020-12-23 DIAGNOSIS — E1165 Type 2 diabetes mellitus with hyperglycemia: Secondary | ICD-10-CM

## 2020-12-23 DIAGNOSIS — E1140 Type 2 diabetes mellitus with diabetic neuropathy, unspecified: Secondary | ICD-10-CM

## 2020-12-23 MED FILL — Duloxetine HCl Enteric Coated Pellets Cap 60 MG (Base Eq): ORAL | 30 days supply | Qty: 30 | Fill #0 | Status: AC

## 2020-12-23 NOTE — Telephone Encounter (Signed)
   Notes to clinic:  review for refill Looks like this was stop at discharge    Requested Prescriptions  Pending Prescriptions Disp Refills   Insulin Pen Needle (TRUEPLUS 5-BEVEL PEN NEEDLES) 32G X 4 MM MISC 100 each 6    Sig: Use as directed      Endocrinology: Diabetes - Testing Supplies Passed - 12/23/2020  9:50 AM      Passed - Valid encounter within last 12 months    Recent Outpatient Visits           6 days ago Generalized abdominal pain   [place] Kerin Perna, NP   1 month ago Hospital discharge follow-up   [place] [name], NP   3 months ago Essential hypertension   [place], Zelda W, NP   6 months ago Need for influenza vaccination   [place], Jarome Matin, RPH-CPP   6 months ago Essential hypertension   [place], Vernia Buff, NP       Future Appointments             In 4 days Tommy Medal, Lavell Islam, MD [facility] for Infectious Disease, [facility]   In 2 weeks [name], NP [place]

## 2020-12-24 ENCOUNTER — Other Ambulatory Visit: Payer: Self-pay

## 2020-12-25 ENCOUNTER — Other Ambulatory Visit: Payer: Self-pay

## 2020-12-25 ENCOUNTER — Inpatient Hospital Stay: Payer: Self-pay | Admitting: Physician Assistant

## 2020-12-25 MED ORDER — TRUEPLUS PEN NEEDLES 32G X 4 MM MISC
6 refills | Status: DC
  Filled 2020-12-25: qty 100, 30d supply, fill #0

## 2020-12-26 ENCOUNTER — Other Ambulatory Visit: Payer: Self-pay

## 2020-12-27 ENCOUNTER — Encounter: Payer: Self-pay | Admitting: Infectious Disease

## 2020-12-27 ENCOUNTER — Other Ambulatory Visit: Payer: Self-pay

## 2020-12-27 ENCOUNTER — Ambulatory Visit (INDEPENDENT_AMBULATORY_CARE_PROVIDER_SITE_OTHER): Payer: Medicaid Other | Admitting: Infectious Disease

## 2020-12-27 VITALS — BP 146/91 | HR 88 | Temp 97.5°F | Resp 16 | Ht 71.0 in | Wt 162.8 lb

## 2020-12-27 DIAGNOSIS — K7030 Alcoholic cirrhosis of liver without ascites: Secondary | ICD-10-CM

## 2020-12-27 DIAGNOSIS — E118 Type 2 diabetes mellitus with unspecified complications: Secondary | ICD-10-CM | POA: Diagnosis not present

## 2020-12-27 DIAGNOSIS — F1010 Alcohol abuse, uncomplicated: Secondary | ICD-10-CM | POA: Diagnosis not present

## 2020-12-27 DIAGNOSIS — B182 Chronic viral hepatitis C: Secondary | ICD-10-CM

## 2020-12-27 DIAGNOSIS — M8600 Acute hematogenous osteomyelitis, unspecified site: Secondary | ICD-10-CM | POA: Diagnosis not present

## 2020-12-27 DIAGNOSIS — I10 Essential (primary) hypertension: Secondary | ICD-10-CM

## 2020-12-27 NOTE — Progress Notes (Signed)
Chief complaint follow-up for diabetic foot infection with osteomyelitis Subjective:    Patient ID: [name] [name], male    DOB: Jul 28, 1961, 60 y.o.   MRN: [id number]  HPI     60 year old Caucasian male with cirrhosis of the liver history of alcoholism who lives in a boarding house unfortunately developed a diabetic foot infection is status post transmetatarsal amputation with MRSA and Pseudomonas aeruginosa having been isolated intraoperatively.  When I saw him in the hospital we placed him on cefepime with vancomycin and have plans for him to complete antibiotics of 29 April.  His boardinghouse was not felt to be a suitable place for him to receive IV antibiotics via home health agency and he was therefore ultimately transferred to [facility] .  Initially vancomycin troughs were not being checked twice weekly but every other week but this was then rectified.  He came off IV antibiotics earlier as his request and wanted to leave the skilled nursing facility we changed him over to see cyclin and ciprofloxacin on 06 December 2020:  He is continue his antibiotics and continue to follow-up with Dr. Sharol Given.  His foot appears to be doing well.  He is has minimal pain in the foot he says occasionally some erythema that develops but tends to go down seems to be dependent on gravity.  He did have some problems with loose stools at night while on antibiotics but nothing that sounds like C. difficile.  He is on probiotics which I told him I do not believe make any impact on preventing clustering difficile colitis.     Past Medical History:  Diagnosis Date  . Barrett's esophagus dx 2016  . Bronchitis   . Chronic hepatitis C without hepatic coma ([place]) 10/31/2014  . Depression   . Diabetes ([place])   . ED (erectile dysfunction)   . GERD (gastroesophageal reflux disease)   . Hepatitis C   . Hiatal hernia 10/2014   3cm  . Neuropathy   . S/P transmetatarsal amputation of foot, left ([place])  11/28/2020    Past Surgical History:  Procedure Laterality Date  . AMPUTATION Left 11/15/2020   Procedure: LEFT TRANSMETATARSAL AMPUTATION;  Surgeon: Newt Minion, MD;  Location: [facility];  Service: Orthopedics;  Laterality: Left;  . APPENDECTOMY    . COLONOSCOPY N/A 02/16/2014   Procedure: COLONOSCOPY;  Surgeon: Gatha Mayer, MD;  [facility];  Service: Endoscopy;  Laterality: N/A;  . COLONOSCOPY    . TONSILLECTOMY      Family History  Problem Relation Age of Onset  . Cancer Mother        breast  . Diabetes Mother   . Heart disease Maternal Grandfather   . Pancreatic cancer Paternal Grandmother   . Stroke Father   . Alcohol abuse Father   . Pancreatic cancer Father   . Colon cancer Neg Hx   . Stomach cancer Neg Hx   . Rectal cancer Neg Hx   . Esophageal cancer Neg Hx       Social History   Socioeconomic History  . Marital status: Single    Spouse name: Not on file  . Number of children: Not on file  . Years of education: Not on file  . Highest education level: Not on file  Occupational History  . Not on file  Tobacco Use  . Smoking status: Current Every Day Smoker    Packs/day: 0.50    Years: 35.00    Pack years: 17.50  Types: Cigarettes  . Smokeless tobacco: Never Used  . Tobacco comment: none x 3 weeks as of 05/22/2020  Vaping Use  . Vaping Use: Never used  Substance and Sexual Activity  . Alcohol use: Not Currently    Comment: daily  . Drug use: No  . Sexual activity: Not on file  Other Topics Concern  . Not on file  Social History Narrative  . Not on file   Social Determinants of Health   Financial Resource Strain: Not on file  Food Insecurity: Not on file  Transportation Needs: Not on file  Physical Activity: Not on file  Stress: Not on file  Social Connections: Not on file    Allergies  Allergen Reactions  . Bee Venom Anaphylaxis  . Lactose Intolerance (Gi) Other (See Comments)    GI upset     Current Outpatient  Medications:  .  aspirin EC 81 MG tablet, Take 1 tablet (81 mg total) by mouth daily., Disp: 30 tablet, Rfl: 11 .  atorvastatin (LIPITOR) 40 MG tablet, TAKE 1 TABLET (40 MG TOTAL) BY MOUTH DAILY., Disp: 30 tablet, Rfl: 0 .  DULoxetine (CYMBALTA) 60 MG capsule, TAKE 1 CAPSULE (60 MG TOTAL) BY MOUTH DAILY., Disp: 90 capsule, Rfl: 1 .  folic acid (FOLVITE) 1 MG tablet, Take 1 tablet (1 mg total) by mouth daily., Disp: 30 tablet, Rfl: 0 .  insulin glargine (LANTUS) 100 UNIT/ML Solostar Pen, Inject 42 Units into the skin 2 (two) times daily., Disp: 30 mL, Rfl: 0 .  insulin lispro (HUMALOG) 100 UNIT/ML KwikPen, inject 22 units 3 times daily add 5 units if blood glucose is greater than 150, Disp: 15 mL, Rfl: 0 .  insulin lispro (HUMALOG) 100 UNIT/ML KwikPen, Inject 5 Units into the skin 3 (three) times daily with meals., Disp: 15 mL, Rfl: 0 .  insulin lispro (HUMALOG) 100 UNIT/ML KwikPen, Inject 5 Units into the skin at bedtime., Disp: 15 mL, Rfl: 0 .  Insulin Pen Needle (TRUEPLUS PEN NEEDLES) 32G X 4 MM MISC, use as directed (Patient taking differently: use as directed), Disp: 100 each, Rfl: 6 .  loperamide (IMODIUM A-D) 2 MG tablet, Take 1 tablet (2 mg total) by mouth 4 (four) times daily as needed for diarrhea or loose stools., Disp: 60 tablet, Rfl: 3 .  losartan (COZAAR) 50 MG tablet, TAKE 1 TABLET (50 MG TOTAL) BY MOUTH DAILY. PLEASE FILL AS A 90 DAY SUPPLY, Disp: 30 tablet, Rfl: 0 .  meloxicam (MOBIC) 7.5 MG tablet, TAKE 1 TABLET (7.5 MG TOTAL) BY MOUTH DAILY., Disp: 30 tablet, Rfl: 0 .  mirtazapine (REMERON) 15 MG tablet, TAKE 1 TABLET (15 MG TOTAL) BY MOUTH AT BEDTIME., Disp: 30 tablet, Rfl: 0 .  Multiple Vitamins-Minerals (THEREMS-M) TABS, Take 400 mcg by mouth daily in the afternoon., Disp: , Rfl:  .  NON FORMULARY, dial antibacterial soap liquid; topical Special Instructions: For left foot wound treatment as ordered., Disp: , Rfl:  .  NON FORMULARY, Diet Regular, Consistent Carbohydrate, Disp: ,  Rfl:  .  omeprazole (PRILOSEC) 40 MG capsule, TAKE 1 CAPSULE (40 MG TOTAL) BY MOUTH DAILY., Disp: 30 capsule, Rfl: 0 .  saccharomyces boulardii (FLORASTOR) 250 MG capsule, Take 1 capsule (250 mg total) by mouth 2 (two) times daily., Disp: , Rfl:  .  acetaminophen (TYLENOL) 325 MG tablet, Take 2 tablets (650 mg total) by mouth every 6 (six) hours as needed for mild pain (or Fever >/= 101)., Disp: , Rfl:  .  albuterol (VENTOLIN  HFA) 108 (90 Base) MCG/ACT inhaler, Inhale 2 puffs into the lungs every 6 (six) hours as needed for wheezing or shortness of breath., Disp: 18 g, Rfl: 0 .  sodium hypochlorite (DAKIN'S 1/2 STRENGTH) external solution, MOISTEN GAUZE WITH DAKINS SOLUTION THEN SQUEEZE OUT EXCESS AND APPLY THE GAUZE INTO THE WOUND BED TO BE CHANGED 2 TIMES PER DAY, Disp: 473 mL, Rfl: 2   Review of Systems  Constitutional: Negative for activity change, appetite change, chills, diaphoresis, fatigue, fever and unexpected weight change.  HENT: Negative for congestion, rhinorrhea, sinus pressure, sneezing, sore throat and trouble swallowing.   Eyes: Negative for photophobia and visual disturbance.  Respiratory: Negative for cough, chest tightness, shortness of breath, wheezing and stridor.   Cardiovascular: Negative for chest pain, palpitations and leg swelling.  Gastrointestinal: Negative for abdominal distention, abdominal pain, anal bleeding, blood in stool, constipation, diarrhea, nausea and vomiting.  Genitourinary: Negative for difficulty urinating, dysuria, flank pain and hematuria.  Musculoskeletal: Negative for arthralgias, back pain, gait problem, joint swelling and myalgias.  Skin: Positive for wound. Negative for color change, pallor and rash.  Neurological: Negative for dizziness, tremors, weakness and light-headedness.  Hematological: Negative for adenopathy. Does not bruise/bleed easily.  Psychiatric/Behavioral: Negative for agitation, behavioral problems, confusion, decreased  concentration, dysphoric mood and sleep disturbance.       Objective:   Physical Exam Constitutional:      Appearance: He is well-developed.  HENT:     Head: Normocephalic and atraumatic.  Eyes:     Conjunctiva/sclera: Conjunctivae normal.  Cardiovascular:     Rate and Rhythm: Normal rate and regular rhythm.  Pulmonary:     Effort: Pulmonary effort is normal. No respiratory distress.     Breath sounds: No wheezing.  Abdominal:     General: There is no distension.     Palpations: Abdomen is soft.  Musculoskeletal:        General: No tenderness. Normal range of motion.     Cervical back: Normal range of motion and neck supple.  Skin:    General: Skin is warm and dry.     Coloration: Skin is not pale.     Findings: No erythema or rash.  Neurological:     General: No focal deficit present.     Mental Status: He is alert and oriented to person, place, and time.  Psychiatric:        Mood and Affect: Mood normal.        Behavior: Behavior normal.        Thought Content: Thought content normal.        Judgment: Judgment normal.      Wound December 27, 2020:         Assessment & Plan:   Diabetic foot infection with osteomyelitis:  He finished antibiotics and we will check to see a metabolic panel sed rate CRP and plan on seeing him back in 1 month's time.  Hepatitis C: Cured  Alcohol abuse: Currently in a boardinghouse.  Cirrhosis of the liver: We will need screening for hepatocellular  I spent greater than 30 minutes with the patient including greater than 50% of time in face to face counsel of the patient and his mother, also in reviewing his laboratory and radiographic data and in coordination of his care.

## 2020-12-28 LAB — CBC WITH DIFFERENTIAL/PLATELET
Absolute Monocytes: 706 cells/uL (ref 200–950)
Basophils Absolute: 50 cells/uL (ref 0–200)
Basophils Relative: 0.7 %
Eosinophils Absolute: 317 cells/uL (ref 15–500)
Eosinophils Relative: 4.4 %
HCT: 44.5 % (ref 38.5–50.0)
Hemoglobin: 14.2 g/dL (ref 13.2–17.1)
Lymphs Abs: 1894 cells/uL (ref 850–3900)
MCH: 28.5 pg (ref 27.0–33.0)
MCHC: 31.9 g/dL — ABNORMAL LOW (ref 32.0–36.0)
MCV: 89.2 fL (ref 80.0–100.0)
MPV: 10.3 fL (ref 7.5–12.5)
Monocytes Relative: 9.8 %
Neutro Abs: 4234 cells/uL (ref 1500–7800)
Neutrophils Relative %: 58.8 %
Platelets: 293 10*3/uL (ref 140–400)
RBC: 4.99 10*6/uL (ref 4.20–5.80)
RDW: 11.9 % (ref 11.0–15.0)
Total Lymphocyte: 26.3 %
WBC: 7.2 10*3/uL (ref 3.8–10.8)

## 2020-12-28 LAB — BASIC METABOLIC PANEL WITH GFR
BUN: 7 mg/dL (ref 7–25)
CO2: 28 mmol/L (ref 20–32)
Calcium: 9.7 mg/dL (ref 8.6–10.3)
Chloride: 101 mmol/L (ref 98–110)
Creat: 0.71 mg/dL (ref 0.70–1.33)
GFR, Est African American: 120 mL/min/{1.73_m2} (ref >=60)
GFR, Est Non African American: 103 mL/min/{1.73_m2} (ref >=60)
Glucose, Bld: 293 mg/dL — ABNORMAL HIGH (ref 65–99)
Potassium: 4.7 mmol/L (ref 3.5–5.3)
Sodium: 135 mmol/L (ref 135–146)

## 2020-12-28 LAB — C-REACTIVE PROTEIN: CRP: 3.1 mg/L (ref <8.0)

## 2020-12-28 LAB — SEDIMENTATION RATE: Sed Rate: 45 mm/h — ABNORMAL HIGH (ref 0–20)

## 2020-12-29 DIAGNOSIS — C801 Malignant (primary) neoplasm, unspecified: Secondary | ICD-10-CM

## 2020-12-29 HISTORY — DX: Malignant (primary) neoplasm, unspecified: C80.1

## 2021-01-07 ENCOUNTER — Ambulatory Visit: Payer: Medicaid Other | Attending: Nurse Practitioner | Admitting: Nurse Practitioner

## 2021-01-07 ENCOUNTER — Other Ambulatory Visit: Payer: Self-pay

## 2021-01-07 ENCOUNTER — Telehealth: Payer: Self-pay

## 2021-01-07 ENCOUNTER — Encounter: Payer: Self-pay | Admitting: Orthopedic Surgery

## 2021-01-07 DIAGNOSIS — F10982 Alcohol use, unspecified with alcohol-induced sleep disorder: Secondary | ICD-10-CM | POA: Diagnosis not present

## 2021-01-07 DIAGNOSIS — R195 Other fecal abnormalities: Secondary | ICD-10-CM

## 2021-01-07 MED ORDER — MIRTAZAPINE 30 MG PO TABS
30.0000 mg | ORAL_TABLET | Freq: Every day | ORAL | 1 refills | Status: DC
  Filled 2021-01-07 – 2021-01-08 (×2): qty 30, 30d supply, fill #0
  Filled 2021-03-04: qty 30, 30d supply, fill #1
  Filled 2021-04-03: qty 30, 30d supply, fill #2
  Filled 2021-04-26: qty 30, 30d supply, fill #3
  Filled 2021-05-18 – 2021-05-20 (×2): qty 30, 30d supply, fill #4
  Filled 2021-06-06 – 2021-06-15 (×2): qty 30, 30d supply, fill #5

## 2021-01-07 MED ORDER — LOPERAMIDE HCL 2 MG PO TABS
2.0000 mg | ORAL_TABLET | Freq: Four times a day (QID) | ORAL | 1 refills | Status: AC | PRN
  Filled 2021-01-07: qty 60, 15d supply, fill #0

## 2021-01-07 NOTE — Telephone Encounter (Signed)
Met with the patient when he was in the clinic for his appointment today. He said he is currently staying at a motel but will not be able to stay there much longer as he is not able to work and afford the cost of the motel. His mother sold her house, so he needs to get all of his belongings out of that house by the end of this month and he is not sure how he will do this.  He also noted that his mother will not be giving him any money from the sale of the house.  Provided him with the phone number for Coordinated Reentry Program with [facility] # [phone] as well as the [facility] # [phone].  He said he is already [facility] at the [facility].  He is in the process of applying for medicare and has submitted medicaid and disability applications.    Instructed him to call this CM back if he needs further resources and if denied medicaid and/or disability a referral can be made to [facility] [place]

## 2021-01-07 NOTE — Progress Notes (Signed)
Assessment & Plan:  Haylen was seen today for diarrhea.  Diagnoses and all orders for this visit:  Loose stools -     loperamide (IMODIUM A-D) 2 MG tablet; Take 1 tablet (2 mg total) by mouth 4 (four) times daily as needed for up to 15 days for diarrhea or loose stools.  Alcohol-induced insomnia (HCC) -     mirtazapine (REMERON) 30 MG tablet; Take 1 tablet (30 mg total) by mouth at bedtime.    Patient has been counseled on age-appropriate routine health concerns for screening and prevention. These are reviewed and up-to-date. Referrals have been placed accordingly. Immunizations are up-to-date or declined.    Subjective:   Chief Complaint  Patient presents with  . Diarrhea   HPI WITT PLITT 60 y.o. male presents to office today with complaints of loose stools. Last oral antibiotics initiated for left food wound/amputation oral 29th. He also stopped taking probiotics 2 weeks ago. He denies N/V and foul odorous stool    Lab Results  Component Value Date   HGBA1C 11.2 (H) 10/28/2020    Endorses depression and insomnia. Likely situational. Will try to increase remeron to see if this will help with depression and insomnia Depression screen [facility] 2/9 01/07/2021 12/27/2020 12/17/2020 11/28/2020 11/04/2020  Decreased Interest 1 1 1  0 0  Down, Depressed, Hopeless 2 0 0 0 1  PHQ - 2 Score 3 1 1  0 1  Altered sleeping 2 - - - 0  Tired, decreased energy 2 - - - 1  Change in appetite 2 - - - 0  Feeling bad or failure about yourself  2 - - - 0  Trouble concentrating 2 - - - 0  Moving slowly or fidgety/restless 2 - - - 0  Suicidal thoughts 0 - - - 0  PHQ-9 Score 15 - - - 2  Some recent data might be hidden    Review of Systems  Constitutional: Negative for fever, malaise/fatigue and weight loss.  HENT: Negative.  Negative for nosebleeds.   Eyes: Negative.  Negative for blurred vision, double vision and photophobia.  Respiratory: Negative.  Negative for cough and shortness of breath.    Cardiovascular: Negative.  Negative for chest pain, palpitations and leg swelling.  Gastrointestinal: Positive for diarrhea. Negative for heartburn, nausea and vomiting.  Musculoskeletal: Negative.  Negative for myalgias.  Neurological: Negative.  Negative for dizziness, focal weakness, seizures and headaches.  Psychiatric/Behavioral: Positive for depression. Negative for suicidal ideas. The patient is nervous/anxious and has insomnia.     Past Medical History:  Diagnosis Date  . Barrett's esophagus dx 2016  . Bronchitis   . Chronic hepatitis C without hepatic coma ([place]) 10/31/2014  . Depression   . Diabetes ([place])   . ED (erectile dysfunction)   . GERD (gastroesophageal reflux disease)   . Hepatitis C   . Hiatal hernia 10/2014   3cm  . Neuropathy   . S/P transmetatarsal amputation of foot, left ([place]) 11/28/2020    Past Surgical History:  Procedure Laterality Date  . AMPUTATION Left 11/15/2020   Procedure: LEFT TRANSMETATARSAL AMPUTATION;  Surgeon: Newt Minion, MD;  Location: [place];  Service: Orthopedics;  Laterality: Left;  . APPENDECTOMY    . COLONOSCOPY N/A 02/16/2014   Procedure: COLONOSCOPY;  Surgeon: Gatha Mayer, MD;  [facility];  Service: Endoscopy;  Laterality: N/A;  . COLONOSCOPY    . TONSILLECTOMY      Family History  Problem Relation Age of Onset  .  Cancer Mother        breast  . Diabetes Mother   . Heart disease Maternal Grandfather   . Pancreatic cancer Paternal Grandmother   . Stroke Father   . Alcohol abuse Father   . Pancreatic cancer Father   . Colon cancer Neg Hx   . Stomach cancer Neg Hx   . Rectal cancer Neg Hx   . Esophageal cancer Neg Hx     Social History Reviewed with no changes to be made today.   Outpatient Medications Prior to Visit  Medication Sig Dispense Refill  . aspirin EC 81 MG tablet Take 1 tablet (81 mg total) by mouth daily. 30 tablet 11  . atorvastatin (LIPITOR) 40 MG tablet TAKE 1 TABLET (40 MG TOTAL) BY MOUTH  DAILY. 30 tablet 0  . DULoxetine (CYMBALTA) 60 MG capsule TAKE 1 CAPSULE (60 MG TOTAL) BY MOUTH DAILY. 90 capsule 1  . folic acid (FOLVITE) 1 MG tablet Take 1 tablet (1 mg total) by mouth daily. 30 tablet 0  . insulin glargine (LANTUS) 100 UNIT/ML Solostar Pen Inject 42 Units into the skin 2 (two) times daily. 30 mL 0  . insulin lispro (HUMALOG) 100 UNIT/ML KwikPen inject 22 units 3 times daily add 5 units if blood glucose is greater than 150 15 mL 0  . Insulin Pen Needle (TRUEPLUS PEN NEEDLES) 32G X 4 MM MISC use as directed (Patient taking differently: use as directed) 100 each 6  . losartan (COZAAR) 50 MG tablet TAKE 1 TABLET (50 MG TOTAL) BY MOUTH DAILY. PLEASE FILL AS A 90 DAY SUPPLY 30 tablet 0  . meloxicam (MOBIC) 7.5 MG tablet TAKE 1 TABLET (7.5 MG TOTAL) BY MOUTH DAILY. 30 tablet 0  . Multiple Vitamins-Minerals (THEREMS-M) TABS Take 400 mcg by mouth daily in the afternoon.    . NON FORMULARY Diet Regular, Consistent Carbohydrate    . omeprazole (PRILOSEC) 40 MG capsule TAKE 1 CAPSULE (40 MG TOTAL) BY MOUTH DAILY. 30 capsule 0  . loperamide (IMODIUM A-D) 2 MG tablet Take 1 tablet (2 mg total) by mouth 4 (four) times daily as needed for diarrhea or loose stools. 60 tablet 3  . mirtazapine (REMERON) 15 MG tablet TAKE 1 TABLET (15 MG TOTAL) BY MOUTH AT BEDTIME. 30 tablet 0  . acetaminophen (TYLENOL) 325 MG tablet Take 2 tablets (650 mg total) by mouth every 6 (six) hours as needed for mild pain (or Fever >/= 101). (Patient not taking: Reported on 01/07/2021)    . albuterol (VENTOLIN HFA) 108 (90 Base) MCG/ACT inhaler Inhale 2 puffs into the lungs every 6 (six) hours as needed for wheezing or shortness of breath. (Patient not taking: Reported on 01/07/2021) 18 g 0  . sodium hypochlorite (DAKIN'S 1/2 STRENGTH) external solution MOISTEN GAUZE WITH DAKINS SOLUTION THEN SQUEEZE OUT EXCESS AND APPLY THE GAUZE INTO THE WOUND BED TO BE CHANGED 2 TIMES PER DAY 473 mL 2  . insulin lispro (HUMALOG) 100  UNIT/ML KwikPen Inject 5 Units into the skin 3 (three) times daily with meals. 15 mL 0  . insulin lispro (HUMALOG) 100 UNIT/ML KwikPen Inject 5 Units into the skin at bedtime. 15 mL 0  . NON FORMULARY dial antibacterial soap liquid; topical Special Instructions: For left foot wound treatment as ordered.    . saccharomyces boulardii (FLORASTOR) 250 MG capsule Take 1 capsule (250 mg total) by mouth 2 (two) times daily. (Patient not taking: Reported on 01/07/2021)     No facility-administered medications prior to visit.  Allergies  Allergen Reactions  . Bee Venom Anaphylaxis  . Lactose Intolerance (Gi) Other (See Comments)    GI upset       Objective:    There were no vitals taken for this visit. Wt Readings from Last 3 Encounters:  12/27/20 162 lb 12.8 oz (73.8 kg)  12/17/20 167 lb 6.4 oz (75.9 kg)  12/05/20 163 lb 3.2 oz (74 kg)    Physical Exam Vitals and nursing note reviewed.  Constitutional:      Appearance: He is well-developed.  HENT:     Head: Normocephalic and atraumatic.  Cardiovascular:     Rate and Rhythm: Normal rate and regular rhythm.     Heart sounds: Normal heart sounds. No murmur heard. No friction rub. No gallop.   Pulmonary:     Effort: Pulmonary effort is normal. No tachypnea or respiratory distress.     Breath sounds: Normal breath sounds. No decreased breath sounds, wheezing, rhonchi or rales.  Chest:     Chest wall: No tenderness.  Abdominal:     General: Bowel sounds are normal.     Palpations: Abdomen is soft.  Musculoskeletal:        General: Normal range of motion.     Cervical back: Normal range of motion.  Skin:    General: Skin is warm and dry.  Neurological:     Mental Status: He is alert and oriented to person, place, and time.     Coordination: Coordination normal.  Psychiatric:        Behavior: Behavior normal. Behavior is cooperative.        Thought Content: Thought content normal.        Judgment: Judgment normal.           Patient has been counseled extensively about nutrition and exercise as well as the importance of adherence with medications and regular follow-up. The patient was given clear instructions to go to ER or return to medical center if symptoms don't improve, worsen or new problems develop. The patient verbalized understanding.   Follow-up: Return for labs june 1st. See me in august.   Farha Dano W Mykiah Schmuck, FNP-BC [facility] [place], [place]   01/18/2021, 12:39 PM

## 2021-01-07 NOTE — Progress Notes (Signed)
Pt states having loose stools about x 10 days a day.

## 2021-01-08 ENCOUNTER — Other Ambulatory Visit: Payer: Self-pay

## 2021-01-09 ENCOUNTER — Encounter: Payer: Self-pay | Admitting: Orthopedic Surgery

## 2021-01-09 ENCOUNTER — Other Ambulatory Visit: Payer: Self-pay

## 2021-01-09 ENCOUNTER — Ambulatory Visit (INDEPENDENT_AMBULATORY_CARE_PROVIDER_SITE_OTHER): Payer: Self-pay | Admitting: Physician Assistant

## 2021-01-09 DIAGNOSIS — Z89432 Acquired absence of left foot: Secondary | ICD-10-CM

## 2021-01-09 NOTE — Progress Notes (Signed)
Office Visit Note   Patient: [name] [name]           Date of Birth: August 25, 1961           MRN: [id number] Visit Date: 01/09/2021              Requested by: [name], NP [address] [place],  [place] [address] PCP: [name], NP  Chief Complaint  Patient presents with  . Left Foot - Routine Post Op    3/18/200 left transmet amputation       HPI: Patient is 2 months status post left transmetatarsal amputation.  With regards to this he is doing fairly well.  He is going to pick up his orthotic at [employer].  His biggest concerns with he and his mother are that he is currently living in a hotel.  He does not have a steady diet and it is precarious.  Therefore he is not using his insulin as he is afraid if he uses it he will have food and will get hypoglycemic.  He is followed by community health and wellness and they have been providing him with information regarding home housing  Assessment & Plan: Visit Diagnoses: No diagnosis found.  Plan: Given his precarious situation I think he is doing well but I would like to follow-up in 1 month I have concerns for his diabetes management and how this affects his feet.  Follow-up in a month or sooner if he is doing well at that visit I think he could follow-up as needed.  Follow-Up Instructions: No follow-ups on file.   Ortho Exam  Patient is alert, oriented, no adenopathy, well-dressed, normal affect, normal respiratory effort. Left transmetatarsal amputation stump just has 1 very small eschar there is no surrounding erythema no drainage the foot is in excellent condition.  No ascending cellulitis or signs of infection  Imaging: No results found. No images are attached to the encounter.  Labs: Lab Results  Component Value Date   HGBA1C 11.2 (H) 10/28/2020   HGBA1C 12.1 (H) 09/23/2020   HGBA1C 6.1 (H) 05/13/2020   ESRSEDRATE 45 (H) 12/27/2020   ESRSEDRATE 40 (H) 11/29/2020   ESRSEDRATE 77 (H) 11/14/2020   CRP 3.1  12/27/2020   CRP 0.8 11/29/2020   CRP 7.7 (H) 11/01/2020   LABURIC 1.1 (L) 05/16/2020   REPTSTATUS 11/18/2020 FINAL 11/15/2020   GRAMSTAIN  11/15/2020    ABUNDANT WBC PRESENT, PREDOMINANTLY PMN FEW GRAM POSITIVE COCCI RARE GRAM NEGATIVE RODS    CULT  11/15/2020    ABUNDANT PSEUDOMONAS AERUGINOSA ABUNDANT STAPHYLOCOCCUS EPIDERMIDIS NO ANAEROBES ISOLATED Performed at [facility], [place] [address] [place], [place] [address]    LABORGA PSEUDOMONAS AERUGINOSA 11/15/2020   LABORGA STAPHYLOCOCCUS EPIDERMIDIS 11/15/2020     Lab Results  Component Value Date   ALBUMIN 2.8 (L) 11/15/2020   ALBUMIN 2.8 (L) 11/14/2020   ALBUMIN 3.4 (L) 11/13/2020    Lab Results  Component Value Date   MG 1.9 11/21/2020   MG 2.0 10/30/2020   MG 2.5 (H) 05/19/2020   Lab Results  Component Value Date   VD25OH 33 10/09/2014    No results found for: PREALBUMIN CBC EXTENDED Latest Ref Rng & Units 12/27/2020 11/29/2020 11/21/2020  WBC 3.8 - 10.8 Thousand/uL 7.2 8.6 10.5  RBC 4.20 - 5.80 Million/uL 4.99 4.68 4.15(L)  HGB 13.2 - 17.1 g/dL 14.2 13.8 12.3(L)  HCT 38.5 - 50.0 % 44.5 42.5 37.7(L)  PLT 140 - 400 Thousand/uL  293 288 306  NEUTROABS 1,500 - 7,800 cells/uL 4,234 4.6 -  LYMPHSABS 850 - 3,900 cells/uL 1,894 2.3 -     There is no height or weight on file to calculate BMI.  Orders:  No orders of the defined types were placed in this encounter.  No orders of the defined types were placed in this encounter.    Procedures: No procedures performed  Clinical Data: No additional findings.  ROS:  All other systems negative, except as noted in the HPI. Review of Systems  Objective: Vital Signs: There were no vitals taken for this visit.  Specialty Comments:  No specialty comments available.  PMFS History: Patient Active Problem List   Diagnosis Date Noted  . S/P transmetatarsal amputation of foot, left ([place]) 11/28/2020  . Hypertension associated with type 2 diabetes mellitus  ([place]) 11/28/2020  . Type 2 diabetes mellitus with diabetic neuropathy, unspecified ([place]) 11/28/2020  . Hyperlipidemia associated with type 2 diabetes mellitus ([place]) 11/28/2020  . GERD without esophagitis 11/28/2020  . Major depression, recurrent, chronic ([place]) 11/28/2020  . Aortic atherosclerosis ([place]) 11/28/2020  . Diabetic neuropathy ([place]) 11/28/2020  . Osteomyelitis ([place]) 11/14/2020  . Sepsis ([place]) 10/28/2020  . Community acquired pneumonia of right lower lobe of lung   . Elevated LFTs   . Hypophosphatemia   . Acute metabolic encephalopathy   . Acute maxillary sinusitis   . Depression   . Physical deconditioning   . Pneumonia 05/14/2020  . DTs (delirium tremens) ([place]) 05/13/2020  . Hyponatremia 05/13/2020  . Hypokalemia 05/13/2020  . SIRS (systemic inflammatory response syndrome) ([place]) 05/13/2020  . Sepsis with acute hypoxic respiratory failure without septic shock ([place])   . Alcohol abuse with alcohol-induced mood disorder ([place]) 12/17/2017  . Hereditary hemochromatosis ([place]) 11/23/2017  . 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 Essential hypertension 04/02/2017  . Hypogonadism male 08/13/2016  . ETOH abuse 06/22/2016  . Low serum testosterone level 01/08/2016  . Type 2 diabetes mellitus with other specified complication ([place]) 70/62/3762  . Erectile dysfunction 09/12/2015  . Hepatic cirrhosis ([place]) 01/10/2015  . Chronic hepatitis C without hepatic coma ([place]) 10/31/2014  . Tobacco use disorder 08/03/2013  . Diabetes type 2, controlled ([place]) 04/27/2013   Past Medical History:  Diagnosis Date  . Barrett's esophagus dx 2016  . Bronchitis   . Chronic hepatitis C without hepatic coma ([place]) 10/31/2014  . Depression   . Diabetes ([place])   . ED (erectile dysfunction)   . GERD (gastroesophageal reflux disease)   . Hepatitis C   . Hiatal hernia 10/2014   3cm  . Neuropathy   . S/P transmetatarsal amputation of foot, left ([place]) 11/28/2020    Family History   Problem Relation Age of Onset  . Cancer Mother        breast  . Diabetes Mother   . Heart disease Maternal Grandfather   . Pancreatic cancer Paternal Grandmother   . Stroke Father   . Alcohol abuse Father   . Pancreatic cancer Father   . Colon cancer Neg Hx   . Stomach cancer Neg Hx   . Rectal cancer Neg Hx   . Esophageal cancer Neg Hx     Past Surgical History:  Procedure Laterality Date  . AMPUTATION Left 11/15/2020   Procedure: LEFT TRANSMETATARSAL AMPUTATION;  Surgeon: Newt Minion, MD;  Location: [place];  Service: Orthopedics;  Laterality: Left;  . APPENDECTOMY    . COLONOSCOPY N/A 02/16/2014   Procedure: COLONOSCOPY;  Surgeon: Glendell Docker  Simonne Maffucci, MD;  Location[facility];  Service: Endoscopy;  Laterality: N/A;  . COLONOSCOPY    . TONSILLECTOMY     Social History   Occupational History  . Not on file  Tobacco Use  . Smoking status: Current Every Day Smoker    Packs/day: 0.50    Years: 35.00    Pack years: 17.50    Types: Cigarettes  . Smokeless tobacco: Never Used  . Tobacco comment: none x 3 weeks as of 05/22/2020  Vaping Use  . Vaping Use: Never used  Substance and Sexual Activity  . Alcohol use: Not Currently    Comment: daily  . Drug use: No  . Sexual activity: Not on file

## 2021-01-18 ENCOUNTER — Encounter: Payer: Self-pay | Admitting: Nurse Practitioner

## 2021-01-22 ENCOUNTER — Encounter: Payer: Self-pay | Admitting: Nurse Practitioner

## 2021-01-22 ENCOUNTER — Other Ambulatory Visit: Payer: Self-pay | Admitting: Internal Medicine

## 2021-01-22 DIAGNOSIS — R159 Full incontinence of feces: Secondary | ICD-10-CM

## 2021-01-24 ENCOUNTER — Ambulatory Visit: Payer: Self-pay | Admitting: Infectious Disease

## 2021-01-28 ENCOUNTER — Other Ambulatory Visit: Payer: Self-pay

## 2021-01-28 ENCOUNTER — Other Ambulatory Visit: Payer: Self-pay | Admitting: Nurse Practitioner

## 2021-01-28 ENCOUNTER — Ambulatory Visit: Payer: Self-pay | Attending: Nurse Practitioner

## 2021-01-28 DIAGNOSIS — E1140 Type 2 diabetes mellitus with diabetic neuropathy, unspecified: Secondary | ICD-10-CM

## 2021-01-28 DIAGNOSIS — IMO0002 Reserved for concepts with insufficient information to code with codable children: Secondary | ICD-10-CM

## 2021-01-28 MED FILL — Duloxetine HCl Enteric Coated Pellets Cap 60 MG (Base Eq): ORAL | 30 days supply | Qty: 30 | Fill #1 | Status: AC

## 2021-01-29 LAB — HEMOGLOBIN A1C
Est. average glucose Bld gHb Est-mCnc: 249 mg/dL
Hgb A1c MFr Bld: 10.3 % — ABNORMAL HIGH (ref 4.8–5.6)

## 2021-02-06 ENCOUNTER — Other Ambulatory Visit: Payer: Self-pay | Admitting: Adult Health

## 2021-02-06 ENCOUNTER — Ambulatory Visit (INDEPENDENT_AMBULATORY_CARE_PROVIDER_SITE_OTHER): Payer: Self-pay | Admitting: Physician Assistant

## 2021-02-06 ENCOUNTER — Encounter: Payer: Self-pay | Admitting: Orthopedic Surgery

## 2021-02-06 ENCOUNTER — Other Ambulatory Visit: Payer: Self-pay

## 2021-02-06 ENCOUNTER — Other Ambulatory Visit: Payer: Self-pay | Admitting: Nurse Practitioner

## 2021-02-06 DIAGNOSIS — E1142 Type 2 diabetes mellitus with diabetic polyneuropathy: Secondary | ICD-10-CM

## 2021-02-06 DIAGNOSIS — Z89432 Acquired absence of left foot: Secondary | ICD-10-CM

## 2021-02-06 MED ORDER — LANTUS SOLOSTAR 100 UNIT/ML ~~LOC~~ SOPN
42.0000 [IU] | PEN_INJECTOR | Freq: Two times a day (BID) | SUBCUTANEOUS | 2 refills | Status: DC
  Filled 2021-02-06: qty 24, 29d supply, fill #0

## 2021-02-06 NOTE — Progress Notes (Signed)
Office Visit Note   Patient: [name] [name]           Date of Birth: Aug 19, 1961           MRN: [id number] Visit Date: 02/06/2021              Requested by: [name], NP [address] [place],  [place] [address] PCP: [name], NP  Chief Complaint  Patient presents with   Left Foot - Routine Post Op    11/15/20 left transmet amputation       HPI: Patient is 3 months status post left transmetatarsal amputation.  He is doing well.  He is received his insert and his filler.  He is asking for referral to endocrinology as he does not have current follow-up for his diabetes management  Assessment & Plan: Visit Diagnoses: No diagnosis found.  Plan: May follow-up as needed we will make referral to endocrinology should look at his feet daily also emphasized consistent Achilles stretching  Follow-Up Instructions: No follow-ups on file.   Ortho Exam  Patient is alert, oriented, no adenopathy, well-dressed, normal affect, normal respiratory effort. Examination of left transmetatarsal amputation stump completely healed no swelling no erythema no ascending cellulitis or signs of infection filler fitting appropriately  Imaging: No results found. No images are attached to the encounter.  Labs: Lab Results  Component Value Date   HGBA1C 10.3 (H) 01/28/2021   HGBA1C 11.2 (H) 10/28/2020   HGBA1C 12.1 (H) 09/23/2020   ESRSEDRATE 45 (H) 12/27/2020   ESRSEDRATE 40 (H) 11/29/2020   ESRSEDRATE 77 (H) 11/14/2020   CRP 3.1 12/27/2020   CRP 0.8 11/29/2020   CRP 7.7 (H) 11/01/2020   LABURIC 1.1 (L) 05/16/2020   REPTSTATUS 11/18/2020 FINAL 11/15/2020   GRAMSTAIN  11/15/2020    ABUNDANT WBC PRESENT, PREDOMINANTLY PMN FEW GRAM POSITIVE COCCI RARE GRAM NEGATIVE RODS    CULT  11/15/2020    ABUNDANT PSEUDOMONAS AERUGINOSA ABUNDANT STAPHYLOCOCCUS EPIDERMIDIS NO ANAEROBES ISOLATED Performed at [facility], [place] [address], [place], [place] [address]    LABORGA  PSEUDOMONAS AERUGINOSA 11/15/2020   LABORGA STAPHYLOCOCCUS EPIDERMIDIS 11/15/2020     Lab Results  Component Value Date   ALBUMIN 2.8 (L) 11/15/2020   ALBUMIN 2.8 (L) 11/14/2020   ALBUMIN 3.4 (L) 11/13/2020    Lab Results  Component Value Date   MG 1.9 11/21/2020   MG 2.0 10/30/2020   MG 2.5 (H) 05/19/2020   Lab Results  Component Value Date   VD25OH 33 10/09/2014    No results found for: PREALBUMIN CBC EXTENDED Latest Ref Rng & Units 12/27/2020 11/29/2020 11/21/2020  WBC 3.8 - 10.8 Thousand/uL 7.2 8.6 10.5  RBC 4.20 - 5.80 Million/uL 4.99 4.68 4.15(L)  HGB 13.2 - 17.1 g/dL 14.2 13.8 12.3(L)  HCT 38.5 - 50.0 % 44.5 42.5 37.7(L)  PLT 140 - 400 Thousand/uL 293 288 306  NEUTROABS 1,500 - 7,800 cells/uL 4,234 4.6 -  LYMPHSABS 850 - 3,900 cells/uL 1,894 2.3 -     There is no height or weight on file to calculate BMI.  Orders:  No orders of the defined types were placed in this encounter.  No orders of the defined types were placed in this encounter.    Procedures: No procedures performed  Clinical Data: No additional findings.  ROS:  All other systems negative, except as noted in the HPI. Review of Systems  Objective: Vital Signs: There were no vitals taken for this visit.  Specialty Comments:  No specialty comments available.  PMFS History: Patient Active Problem List   Diagnosis Date Noted   S/P transmetatarsal amputation of foot, left ([place]) 11/28/2020   Hypertension associated with type 2 diabetes mellitus ([place]) 11/28/2020   Type 2 diabetes mellitus with diabetic neuropathy, unspecified ([place]) 11/28/2020   Hyperlipidemia associated with type 2 diabetes mellitus ([place]) 11/28/2020   GERD without esophagitis 11/28/2020   Major depression, recurrent, chronic ([place]) 11/28/2020   Aortic atherosclerosis ([place]) 11/28/2020   Diabetic neuropathy ([place]) 11/28/2020   Osteomyelitis ([place]) 11/14/2020   Sepsis ([place]) 10/28/2020   Community acquired pneumonia of right  lower lobe of lung    Elevated LFTs    Hypophosphatemia    Acute metabolic encephalopathy    Acute maxillary sinusitis    Depression    Physical deconditioning    Pneumonia 05/14/2020   DTs (delirium tremens) ([place]) 05/13/2020   Hyponatremia 05/13/2020   Hypokalemia 05/13/2020   SIRS (systemic inflammatory response syndrome) ([place]) 05/13/2020   Sepsis with acute hypoxic respiratory failure without septic shock (HCC)    Alcohol abuse with alcohol-induced mood disorder ([place]) 12/17/2017   Hereditary hemochromatosis ([place]) 11/23/2017   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   Essential hypertension 04/02/2017   Hypogonadism male 08/13/2016   ETOH abuse 06/22/2016   Low serum testosterone level 01/08/2016   Type 2 diabetes mellitus with other specified complication ([place]) 06/27/2535   Erectile dysfunction 09/12/2015   Hepatic cirrhosis ([place]) 01/10/2015   Chronic hepatitis C without hepatic coma ([place]) 10/31/2014   Tobacco use disorder 08/03/2013   Diabetes type 2, controlled ([place]) 04/27/2013   Past Medical History:  Diagnosis Date   Barrett's esophagus dx 2016   Bronchitis    Chronic hepatitis C without hepatic coma ([place]) 10/31/2014   Depression    Diabetes ([place])    ED (erectile dysfunction)    GERD (gastroesophageal reflux disease)    Hepatitis C    Hiatal hernia 10/2014   3cm   Neuropathy    S/P transmetatarsal amputation of foot, left ([place]) 11/28/2020    Family History  Problem Relation Age of Onset   Cancer Mother        breast   Diabetes Mother    Heart disease Maternal Grandfather    Pancreatic cancer Paternal Grandmother    Stroke Father    Alcohol abuse Father    Pancreatic cancer Father    Colon cancer Neg Hx    Stomach cancer Neg Hx    Rectal cancer Neg Hx    Esophageal cancer Neg Hx     Past Surgical History:  Procedure Laterality Date   AMPUTATION Left 11/15/2020   Procedure: LEFT TRANSMETATARSAL AMPUTATION;  Surgeon: Newt Minion, MD;   Location: [facility];  Service: Orthopedics;  Laterality: Left;   APPENDECTOMY     COLONOSCOPY N/A 02/16/2014   Procedure: COLONOSCOPY;  Surgeon: Gatha Mayer, MD;  [facility];  Service: Endoscopy;  Laterality: N/A;   COLONOSCOPY     TONSILLECTOMY     Social History   Occupational History   Not on file  Tobacco Use   Smoking status: Every Day    Packs/day: 0.50    Years: 35.00    Pack years: 17.50    Types: Cigarettes   Smokeless tobacco: Never   Tobacco comments:    none x 3 weeks as of 05/22/2020  Vaping Use   Vaping Use: Never used  Substance and Sexual Activity   Alcohol use: Not  Currently    Comment: daily   Drug use: No   Sexual activity: Not on file

## 2021-02-06 NOTE — Telephone Encounter (Signed)
   Notes to clinic: medication filled by a different pre scriber Review for continued use and refill      Requested Prescriptions  Pending Prescriptions Disp Refills   insulin glargine (LANTUS SOLOSTAR) 100 UNIT/ML Solostar Pen 30 mL 0    Sig: Inject 42 Units into the skin 2 (two) times daily.      There is no refill protocol information for this order

## 2021-02-07 ENCOUNTER — Other Ambulatory Visit: Payer: Self-pay

## 2021-02-07 ENCOUNTER — Ambulatory Visit (INDEPENDENT_AMBULATORY_CARE_PROVIDER_SITE_OTHER): Payer: Medicaid Other | Admitting: Internal Medicine

## 2021-02-07 ENCOUNTER — Encounter: Payer: Self-pay | Admitting: Internal Medicine

## 2021-02-07 VITALS — BP 139/82 | HR 101 | Temp 98.2°F | Wt 154.8 lb

## 2021-02-07 DIAGNOSIS — Z89432 Acquired absence of left foot: Secondary | ICD-10-CM

## 2021-02-07 DIAGNOSIS — K7460 Unspecified cirrhosis of liver: Secondary | ICD-10-CM | POA: Diagnosis not present

## 2021-02-07 DIAGNOSIS — M86272 Subacute osteomyelitis, left ankle and foot: Secondary | ICD-10-CM

## 2021-02-07 DIAGNOSIS — B182 Chronic viral hepatitis C: Secondary | ICD-10-CM | POA: Diagnosis not present

## 2021-02-07 DIAGNOSIS — R197 Diarrhea, unspecified: Secondary | ICD-10-CM

## 2021-02-07 NOTE — Progress Notes (Signed)
[facility]  CHIEF COMPLAINT:    Follow up for osteomyelitis  SUBJECTIVE:    [name] [name] is a 60 y.o. male with PMHx as below who presents to the clinic for osteomyelitis.   Patient is followed by my partner, Dr. Tommy Medal.  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  Unfortunately he developed a diabetic foot infection and is now status post transmetatarsal amputation with MRSA and Pseudomonas aeruginosa isolated intraoperatively.  He was previously seen by Dr. Tommy Medal on April 29 at which point he was completing his course of antibiotics.  He was initially managed with antibiotics at the nursing home, however, he came off IV therapy early at his request and wanted to leave so he was changed to doxycycline and ciprofloxacin on April 8.  When he was last seen by Dr. Drucilla Schmidt on April 29 his foot appeared to be doing well with minimal pain and mild erythema that develops but then goes down when he elevates his leg.  Labs obtained on the 29th showed a normal CRP, ESR slightly elevated at 45, hyperglycemia, and no other significant abnormalities.  His most recent A1c on May 31 is 10.3.  He saw orthopedic surgery yesterday for routine follow-up.  He was referred to endocrinology at that time.  Exam of his transmetatarsal amputation site was completely healed with no swelling or erythema.  There was no signs of infection.  He was asked to follow-up with their office only as needed at that time.  He complains of diarrhea today and reports he has been referred to GI. No laxatives, no lactulose.  Just started metamucil. Describes loose, gray-tan stool, and fatty stool. No fevers, chills.  Positive crampy abdominal pain. Was checked and negative for C diff and stool cultures 12/03/20.  Please see A&P for the details of today's visit and status of the patient's medical problems.   Patient's Medications  New Prescriptions    No medications on file  Previous Medications   ACETAMINOPHEN (TYLENOL) 325 MG TABLET    Take 2 tablets (650 mg total) by mouth every 6 (six) hours as needed for mild pain (or Fever >/= 101).   ALBUTEROL (VENTOLIN HFA) 108 (90 BASE) MCG/ACT INHALER    Inhale 2 puffs into the lungs every 6 (six) hours as needed for wheezing or shortness of breath.   ASPIRIN EC 81 MG TABLET    Take 1 tablet (81 mg total) by mouth daily.   ATORVASTATIN (LIPITOR) 40 MG TABLET    TAKE 1 TABLET (40 MG TOTAL) BY MOUTH DAILY.   DULOXETINE (CYMBALTA) 60 MG CAPSULE    TAKE 1 CAPSULE (60 MG TOTAL) BY MOUTH DAILY.   FOLIC ACID (FOLVITE) 1 MG TABLET    Take 1 tablet (1 mg total) by mouth daily.   INSULIN GLARGINE (LANTUS SOLOSTAR) 100 UNIT/ML SOLOSTAR PEN    Inject 42 Units into the skin 2 (two) times daily.   INSULIN LISPRO (HUMALOG) 100 UNIT/ML KWIKPEN    inject 22 units 3 times daily add 5 units if blood glucose is greater than 150   INSULIN PEN NEEDLE (TRUEPLUS PEN NEEDLES) 32G X 4 MM MISC    use as directed   LOSARTAN (COZAAR) 50 MG TABLET    TAKE 1 TABLET (50 MG TOTAL) BY MOUTH DAILY. PLEASE FILL AS A 90 DAY SUPPLY   MELOXICAM (MOBIC) 7.5 MG TABLET  TAKE 1 TABLET (7.5 MG TOTAL) BY MOUTH DAILY.   MIRTAZAPINE (REMERON) 30 MG TABLET    Take 1 tablet (30 mg total) by mouth at bedtime.   MULTIPLE VITAMINS-MINERALS (THEREMS-M) TABS    Take 400 mcg by mouth daily in the afternoon.   NON FORMULARY    Diet Regular, Consistent Carbohydrate   OMEPRAZOLE (PRILOSEC) 40 MG CAPSULE    TAKE 1 CAPSULE (40 MG TOTAL) BY MOUTH DAILY.  Modified Medications   No medications on file  Discontinued Medications   No medications on file      Past Medical History:  Diagnosis Date   Barrett's esophagus dx 2016   Bronchitis    Chronic hepatitis C without hepatic coma ([place]) 10/31/2014   Depression    Diabetes ([place])    ED (erectile dysfunction)    GERD (gastroesophageal reflux disease)    Hepatitis C    Hiatal hernia 10/2014   3cm    Neuropathy    S/P transmetatarsal amputation of foot, left ([place]) 11/28/2020    Social History   Tobacco Use   Smoking status: Every Day    Packs/day: 0.50    Years: 35.00    Pack years: 17.50    Types: Cigarettes   Smokeless tobacco: Never   Tobacco comments:    none x 3 weeks as of 05/22/2020  Vaping Use   Vaping Use: Never used  Substance Use Topics   Alcohol use: Not Currently    Comment: daily   Drug use: No    Family History  Problem Relation Age of Onset   Cancer Mother        breast   Diabetes Mother    Heart disease Maternal Grandfather    Pancreatic cancer Paternal Grandmother    Stroke Father    Alcohol abuse Father    Pancreatic cancer Father    Colon cancer Neg Hx    Stomach cancer Neg Hx    Rectal cancer Neg Hx    Esophageal cancer Neg Hx     Allergies  Allergen Reactions   Bee Venom Anaphylaxis   Lactose Intolerance (Gi) Other (See Comments)    GI upset    Review of Systems  Constitutional:  Negative for chills and fever.  Gastrointestinal:  Positive for abdominal pain and diarrhea. Negative for blood in stool and melena.  Genitourinary: Negative.   Musculoskeletal:  Positive for falls.    OBJECTIVE:    There were no vitals filed for this visit. There is no height or weight on file to calculate BMI.  Physical Exam Constitutional:      Comments: Thin appearing man, no distress.   HENT:     Head: Normocephalic and atraumatic.  Eyes:     General: Scleral icterus present.  Abdominal:     General: There is distension.     Palpations: Abdomen is soft.     Tenderness: There is no abdominal tenderness.  Musculoskeletal:     Comments: S/p TMA  Skin:    General: Skin is warm and dry.     Coloration: Skin is jaundiced.  Neurological:     General: No focal deficit present.     Mental Status: He is oriented to person, place, and time.  Psychiatric:        Mood and Affect: Mood normal.        Behavior: Behavior normal.     Labs and  Microbiology: CBC Latest Ref Rng & Units 12/27/2020 11/29/2020 11/21/2020  WBC 3.8 -  10.8 Thousand/uL 7.2 8.6 10.5  Hemoglobin 13.2 - 17.1 g/dL 14.2 13.8 12.3(L)  Hematocrit 38.5 - 50.0 % 44.5 42.5 37.7(L)  Platelets 140 - 400 Thousand/uL 293 288 306   CMP Latest Ref Rng & Units 12/27/2020 12/02/2020 11/29/2020  Glucose 65 - 99 mg/dL 293(H) 213(H) 369(H)  BUN 7 - 25 mg/dL '7 7 8  ' Creatinine 0.70 - 1.33 mg/dL 0.71 0.56(L) 0.51(L)  Sodium 135 - 146 mmol/L 135 134(L) 131(L)  Potassium 3.5 - 5.3 mmol/L 4.7 3.9 3.9  Chloride 98 - 110 mmol/L 101 102 99  CO2 20 - 32 mmol/L '28 24 24  ' Calcium 8.6 - 10.3 mg/dL 9.7 9.6 9.3  Total Protein 6.5 - 8.1 g/dL - - -  Total Bilirubin 0.3 - 1.2 mg/dL - - -  Alkaline Phos 38 - 126 U/L - - -  AST 15 - 41 U/L - - -  ALT 0 - 44 U/L - - -       ASSESSMENT & PLAN:     Diabetic foot infection with osteomyelitis: He has completed antibiotics and is now actually been off all antibiotics for about 6 weeks.  His amputation site looks well-healed and is not show any concerns for ongoing infection.  Return precautions discussed and will have patient follow-up as needed.  Hepatitis C: This has been cured.  Cirrhosis of the liver: He will need [place] screening which can be done via his primary care or GI.  Diarrhea: New issue.  This does not sound infectious and I suspect more related to underlying liver disease.  Will check C diff given recent course of Cipro and Rx as needed.  Agree with follow up with GI later this month.    [facility] 02/07/2021, 6:13 AM

## 2021-02-07 NOTE — Patient Instructions (Signed)
Thank you for coming to see me today. It was a pleasure seeing you.  To Do: C diff testing and I will let you know if positive and needs treatment Follow up with GI as I think your symptoms are mostly related to underlying liver issues Follow up in our clinic as needed since your foot is healed.   If you have any questions or concerns, please do not hesitate to call the office at [phone].  Take Care,   Jule Ser, DO

## 2021-02-13 ENCOUNTER — Other Ambulatory Visit: Payer: Self-pay

## 2021-02-18 ENCOUNTER — Other Ambulatory Visit: Payer: Self-pay

## 2021-02-18 DIAGNOSIS — R197 Diarrhea, unspecified: Secondary | ICD-10-CM

## 2021-02-18 NOTE — Addendum Note (Signed)
Addended by: Caffie Pinto on: 02/18/2021 11:20 AM   Modules accepted: Orders

## 2021-02-19 LAB — C. DIFFICILE GDH AND TOXIN A/B
GDH ANTIGEN: NOT DETECTED
MICRO NUMBER:: 12034209
SPECIMEN QUALITY:: ADEQUATE
TOXIN A AND B: NOT DETECTED

## 2021-02-24 ENCOUNTER — Telehealth: Payer: Self-pay

## 2021-02-24 NOTE — Telephone Encounter (Signed)
-----   Message from Mignon Pine, DO sent at 02/24/2021 11:14 AM EDT ----- Can you please let patient know that his C diff test was negative.  Follow up with GI as scheduled for his diarrhea.  Thanks.

## 2021-02-24 NOTE — Telephone Encounter (Signed)
Relayed results to patient; has GI appointment on 6/29.  Andriea Hasegawa Lorita Officer, RN

## 2021-02-26 ENCOUNTER — Encounter: Payer: Self-pay | Admitting: Gastroenterology

## 2021-02-26 ENCOUNTER — Other Ambulatory Visit (INDEPENDENT_AMBULATORY_CARE_PROVIDER_SITE_OTHER): Payer: Medicaid Other

## 2021-02-26 ENCOUNTER — Ambulatory Visit (INDEPENDENT_AMBULATORY_CARE_PROVIDER_SITE_OTHER)
Admission: RE | Admit: 2021-02-26 | Discharge: 2021-02-26 | Disposition: A | Discharge status: Home / Self Care | Payer: Medicaid Other | Source: Ambulatory Visit | Attending: Gastroenterology | Admitting: Gastroenterology

## 2021-02-26 ENCOUNTER — Other Ambulatory Visit: Payer: Self-pay

## 2021-02-26 ENCOUNTER — Telehealth: Payer: Self-pay | Admitting: Gastroenterology

## 2021-02-26 ENCOUNTER — Ambulatory Visit (INDEPENDENT_AMBULATORY_CARE_PROVIDER_SITE_OTHER): Payer: Medicaid Other | Admitting: Gastroenterology

## 2021-02-26 VITALS — BP 118/62 | HR 88 | Ht 71.0 in | Wt 155.2 lb

## 2021-02-26 DIAGNOSIS — C259 Malignant neoplasm of pancreas, unspecified: Secondary | ICD-10-CM

## 2021-02-26 DIAGNOSIS — R17 Unspecified jaundice: Secondary | ICD-10-CM

## 2021-02-26 DIAGNOSIS — K7460 Unspecified cirrhosis of liver: Secondary | ICD-10-CM | POA: Diagnosis not present

## 2021-02-26 DIAGNOSIS — R634 Abnormal weight loss: Secondary | ICD-10-CM

## 2021-02-26 HISTORY — DX: Unspecified jaundice: R17

## 2021-02-26 LAB — CBC WITH DIFFERENTIAL/PLATELET
Basophils Relative: 0 % (ref 0.0–3.0)
Eosinophils Relative: 0 % (ref 0.0–5.0)
HCT: 36.6 % — ABNORMAL LOW (ref 39.0–52.0)
Hemoglobin: 12.9 g/dL — ABNORMAL LOW (ref 13.0–17.0)
Lymphocytes Relative: 21 % (ref 12.0–46.0)
MCHC: 35.2 g/dL (ref 30.0–36.0)
MCV: 91.3 fl (ref 78.0–100.0)
Monocytes Relative: 7 % (ref 3.0–12.0)
Neutrophils Relative %: 72 % (ref 43.0–77.0)
Platelets: 297 10*3/uL (ref 150.0–400.0)
RBC: 4.01 Mil/uL — ABNORMAL LOW (ref 4.22–5.81)
RDW: 16.8 % — ABNORMAL HIGH (ref 11.5–15.5)
WBC: 9.3 10*3/uL (ref 4.0–10.5)

## 2021-02-26 LAB — COMPREHENSIVE METABOLIC PANEL
ALT: 70 U/L — ABNORMAL HIGH (ref 0–53)
AST: 164 U/L — ABNORMAL HIGH (ref 0–37)
Albumin: 3.4 g/dL — ABNORMAL LOW (ref 3.5–5.2)
Alkaline Phosphatase: 683 U/L — ABNORMAL HIGH (ref 39–117)
BUN: 5 mg/dL — ABNORMAL LOW (ref 6–23)
CO2: 27 mEq/L (ref 19–32)
Calcium: 9.3 mg/dL (ref 8.4–10.5)
Chloride: 96 mEq/L (ref 96–112)
Creatinine, Ser: 0.7 mg/dL (ref 0.40–1.50)
GFR: 101.42 mL/min (ref >60.00)
Glucose, Bld: 90 mg/dL (ref 70–99)
Potassium: 4.1 mEq/L (ref 3.5–5.1)
Sodium: 131 mEq/L — ABNORMAL LOW (ref 135–145)
Total Bilirubin: 25.4 mg/dL — ABNORMAL HIGH (ref 0.2–1.2)
Total Protein: 7.8 g/dL (ref 6.0–8.3)

## 2021-02-26 LAB — PROTIME-INR
INR: 1.3 ratio — ABNORMAL HIGH (ref 0.8–1.0)
Prothrombin Time: 14.9 s — ABNORMAL HIGH (ref 9.6–13.1)

## 2021-02-26 MED ORDER — IOHEXOL 300 MG/ML  SOLN
100.0000 mL | Freq: Once | INTRAMUSCULAR | Status: AC | PRN
  Administered 2021-02-26: 100 mL via INTRAVENOUS

## 2021-02-26 NOTE — H&P (View-Only) (Signed)
02/26/2021 [name] [name] [id number] Dec 18, 1960   HISTORY OF PRESENT ILLNESS: This is a 60 year old male who is a patient of Dr. Doyne Keel.  He has not been seen here since 2018.  He has a history of hep C that was cured, has cirrhosis by previous imaging and FibroScan.  Previously had quite extensive alcohol use, but both the patient and his mother report no alcohol in 8 months.  He presents here today with complaints of fecal incontinence.  He says that he has been having very loose stools, greater than 8 times a day.  Having incontinence and nocturnal stools.  He says that they are very oily and actually shows me pictures where there is extensive oil droplets on the surface of the water.  He reports some upper abdominal discomfort and crampy lower abdominal pain.  They report at least a 30 pound weight loss over the past several months.  He is obviously jaundiced today, and they said that somebody noticed that over the last couple of weeks.  He says that due to the lighting in his bathroom he did not really pay much attention.  His urine has been very dark in color as well, however.  He is following with infectious disease and had been on antibiotics for osteomyelitis so stool for C. difficile was ordered and was negative.  He says overall he feels just very fatigued.  He tells me that he has had worsening of his blood sugars recently and they seem to be up and down all the time.  Past Medical History:  Diagnosis Date   Barrett's esophagus dx 2016   Bronchitis    Chronic hepatitis C without hepatic coma ([place]) 10/31/2014   Depression    Diabetes ([place])    ED (erectile dysfunction)    GERD (gastroesophageal reflux disease)    Hepatitis C    Hiatal hernia 10/2014   3cm   Neuropathy    S/P transmetatarsal amputation of foot, left ([place]) 11/28/2020   Past Surgical History:  Procedure Laterality Date   AMPUTATION Left 11/15/2020   Procedure: LEFT TRANSMETATARSALS AMPUTATION;  Surgeon:  Newt Minion, MD;  Location: [facility];  Service: Orthopedics;  Laterality: Left;   APPENDECTOMY     COLONOSCOPY N/A 02/16/2014   Procedure: COLONOSCOPY;  Surgeon: Gatha Mayer, MD;  Location: [facility];  Service: Endoscopy;  Laterality: N/A;   COLONOSCOPY     TONSILLECTOMY      reports that he has been smoking cigarettes. He has a 17.50 pack-year smoking history. He has never used smokeless tobacco. He reports previous alcohol use. He reports that he does not use drugs. family history includes Alcohol abuse in his father; Breast cancer in his mother; Diabetes in his paternal grandfather; Heart disease in his maternal grandfather; Pancreatic cancer in his paternal grandmother; Stroke in his father. Allergies  Allergen Reactions   Bee Venom Anaphylaxis   Lactose Intolerance (Gi) Other (See Comments)    GI upset      Outpatient Encounter Medications as of 02/26/2021  Medication Sig   acetaminophen (TYLENOL) 325 MG tablet Take 2 tablets (650 mg total) by mouth every 6 (six) hours as needed for mild pain (or Fever >/= 101).   albuterol (VENTOLIN HFA) 108 (90 Base) MCG/ACT inhaler Inhale 2 puffs into the lungs every 6 (six) hours as needed for wheezing or shortness of breath.   aspirin EC 81 MG tablet Take 1 tablet (81 mg total) by mouth daily.   atorvastatin (  LIPITOR) 40 MG tablet TAKE 1 TABLET (40 MG TOTAL) BY MOUTH DAILY.   DULoxetine (CYMBALTA) 60 MG capsule TAKE 1 CAPSULE (60 MG TOTAL) BY MOUTH DAILY.   folic acid (FOLVITE) 1 MG tablet Take 1 tablet (1 mg total) by mouth daily.   glipiZIDE (GLUCOTROL) 5 MG tablet Take 5 mg by mouth daily before breakfast. Please fill as a 90 day supply   ibuprofen (ADVIL) 800 MG tablet Take 800 mg by mouth in the morning.   insulin glargine (LANTUS) 100 UNIT/ML injection Inject 42 Units into the skin 2 (two) times daily.   Insulin Pen Needle (TRUEPLUS PEN NEEDLES) 32G X 4 MM MISC use as directed (Patient taking differently: use as directed)   losartan  (COZAAR) 50 MG tablet TAKE 1 TABLET (50 MG TOTAL) BY MOUTH DAILY. PLEASE FILL AS A 90 DAY SUPPLY   meloxicam (MOBIC) 7.5 MG tablet TAKE 1 TABLET (7.5 MG TOTAL) BY MOUTH DAILY.   mirtazapine (REMERON) 30 MG tablet Take 1 tablet (30 mg total) by mouth at bedtime.   Multiple Vitamins-Minerals (THEREMS-M) TABS Take 400 mcg by mouth daily in the afternoon.   NON FORMULARY Diet Regular, Consistent Carbohydrate   omeprazole (PRILOSEC) 40 MG capsule TAKE 1 CAPSULE (40 MG TOTAL) BY MOUTH DAILY.   [DISCONTINUED] amLODipine (NORVASC) 10 MG tablet Take 1 tablet (10 mg total) by mouth daily. Please fill as a 90 day supply   [DISCONTINUED] glipiZIDE (GLUCOTROL) 5 MG tablet Take 1 tablet (5 mg total) by mouth daily before breakfast. Please fill as a 90 day supply (Patient taking differently: Take 5 mg by mouth daily before breakfast.)   [DISCONTINUED] insulin aspart protamine - aspart (NOVOLOG 70/30 FLEXPEN RELION) (70-30) 100 UNIT/ML FlexPen Inject 0.2 mLs (20 Units total) into the skin 2 (two) times daily.   [DISCONTINUED] insulin glargine (LANTUS SOLOSTAR) 100 UNIT/ML Solostar Pen Inject 42 Units into the skin 2 (two) times daily.   [DISCONTINUED] insulin lispro (HUMALOG) 100 UNIT/ML KwikPen inject 22 units 3 times daily add 5 units if blood glucose is greater than 150 (Patient not taking: Reported on 02/07/2021)   [DISCONTINUED] loratadine (CLARITIN) 10 MG tablet Take 1 tablet (10 mg total) by mouth daily. Please fill as a 90 day supply (Patient taking differently: Take 10 mg by mouth daily.)   No facility-administered encounter medications on file as of 02/26/2021.     REVIEW OF SYSTEMS  : All other systems reviewed and negative except where noted in the History of Present Illness.   PHYSICAL EXAM: BP 118/62   Pulse 88   Ht 5\' 11"  (1.803 m)   Wt 155 lb 3.2 oz (70.4 kg)   BMI 21.65 kg/m  General: Well developed white male in no acute distress; deep jaundice noted Head: Normocephalic and  atraumatic Eyes  Deep scleral icterus noted. Ears: Normal auditory acuity Lungs: Clear throughout to auscultation; no W/R/R. Heart: Regular rate and rhythm; no M/R/G. Abdomen: Soft, non-distended.  BS present.  Non-tender.  There is fullness in the RUQ and epigastrium that I am concerned is likely due to a malignancy. Musculoskeletal: Symmetrical with no gross deformities  Skin: No lesions on visible extremities Extremities: No edema  Neurological: Alert oriented x 4, grossly non-focal Psychological:  Alert and cooperative. Normal mood and affect  ASSESSMENT AND PLAN: *60 year old male with past medical history of hep C that has been cured, but cirrhosis noted by imaging/FibroScan previously.  Previous alcohol use, but none in 8 months.  Presents with 30 pound weight  loss, is obviously jaundiced in the office, right upper quadrant abdominal discomfort, and oily stools with increased frequency and incontinence.  Stool for C. difficile is negative.  Has not been seen by [place] since 2018.  He has fullness in his right upper quadrant and epigastrium.  I am highly concerned for either a primary pancreatic or liver cancer.  He is deeply jaundiced, but does not appear toxic.  He is going to get some labs done and we are going to get a stat CT scan of the abdomen and pelvis with contrast today.  Advised him that pending those results he may need to be sent to the hospital for expedited evaluation.   CC:  Ladell Pier, MD

## 2021-02-26 NOTE — Addendum Note (Signed)
Addended by: Darrall Dears on: 02/26/2021 10:43 AM   Modules accepted: Orders

## 2021-02-26 NOTE — Progress Notes (Signed)
02/26/2021 ESPEN BETHEL [id number] 11-29-1960   HISTORY OF PRESENT ILLNESS: This is a 60 year old male who is a patient of Dr. Doyne Keel.  He has not been seen here since 2018.  He has a history of hep C that was cured, has cirrhosis by previous imaging and FibroScan.  Previously had quite extensive alcohol use, but both the patient and his mother report no alcohol in 8 months.  He presents here today with complaints of fecal incontinence.  He says that he has been having very loose stools, greater than 8 times a day.  Having incontinence and nocturnal stools.  He says that they are very oily and actually shows me pictures where there is extensive oil droplets on the surface of the water.  He reports some upper abdominal discomfort and crampy lower abdominal pain.  They report at least a 30 pound weight loss over the past several months.  He is obviously jaundiced today, and they said that somebody noticed that over the last couple of weeks.  He says that due to the lighting in his bathroom he did not really pay much attention.  His urine has been very dark in color as well, however.  He is following with infectious disease and had been on antibiotics for osteomyelitis so stool for C. difficile was ordered and was negative.  He says overall he feels just very fatigued.  He tells me that he has had worsening of his blood sugars recently and they seem to be up and down all the time.  Past Medical History:  Diagnosis Date   Barrett's esophagus dx 2016   Bronchitis    Chronic hepatitis C without hepatic coma ([place]) 10/31/2014   Depression    Diabetes ([place])    ED (erectile dysfunction)    GERD (gastroesophageal reflux disease)    Hepatitis C    Hiatal hernia 10/2014   3cm   Neuropathy    S/P transmetatarsal amputation of foot, left ([place]) 11/28/2020   Past Surgical History:  Procedure Laterality Date   AMPUTATION Left 11/15/2020   Procedure: LEFT TRANSMETATARSALS AMPUTATION;  Surgeon:  Newt Minion, MD;  Location: [facility];  Service: Orthopedics;  Laterality: Left;   APPENDECTOMY     COLONOSCOPY N/A 02/16/2014   Procedure: COLONOSCOPY;  Surgeon: Gatha Mayer, MD;  [facility];  Service: Endoscopy;  Laterality: N/A;   COLONOSCOPY     TONSILLECTOMY      reports that he has been smoking cigarettes. He has a 17.50 pack-year smoking history. He has never used smokeless tobacco. He reports previous alcohol use. He reports that he does not use drugs. family history includes Alcohol abuse in his father; Breast cancer in his mother; Diabetes in his paternal grandfather; Heart disease in his maternal grandfather; Pancreatic cancer in his paternal grandmother; Stroke in his father. Allergies  Allergen Reactions   Bee Venom Anaphylaxis   Lactose Intolerance (Gi) Other (See Comments)    GI upset      Outpatient Encounter Medications as of 02/26/2021  Medication Sig   acetaminophen (TYLENOL) 325 MG tablet Take 2 tablets (650 mg total) by mouth every 6 (six) hours as needed for mild pain (or Fever >/= 101).   albuterol (VENTOLIN HFA) 108 (90 Base) MCG/ACT inhaler Inhale 2 puffs into the lungs every 6 (six) hours as needed for wheezing or shortness of breath.   aspirin EC 81 MG tablet Take 1 tablet (81 mg total) by mouth daily.   atorvastatin (  LIPITOR) 40 MG tablet TAKE 1 TABLET (40 MG TOTAL) BY MOUTH DAILY.   DULoxetine (CYMBALTA) 60 MG capsule TAKE 1 CAPSULE (60 MG TOTAL) BY MOUTH DAILY.   folic acid (FOLVITE) 1 MG tablet Take 1 tablet (1 mg total) by mouth daily.   glipiZIDE (GLUCOTROL) 5 MG tablet Take 5 mg by mouth daily before breakfast. Please fill as a 90 day supply   ibuprofen (ADVIL) 800 MG tablet Take 800 mg by mouth in the morning.   insulin glargine (LANTUS) 100 UNIT/ML injection Inject 42 Units into the skin 2 (two) times daily.   Insulin Pen Needle (TRUEPLUS PEN NEEDLES) 32G X 4 MM MISC use as directed (Patient taking differently: use as directed)   losartan  (COZAAR) 50 MG tablet TAKE 1 TABLET (50 MG TOTAL) BY MOUTH DAILY. PLEASE FILL AS A 90 DAY SUPPLY   meloxicam (MOBIC) 7.5 MG tablet TAKE 1 TABLET (7.5 MG TOTAL) BY MOUTH DAILY.   mirtazapine (REMERON) 30 MG tablet Take 1 tablet (30 mg total) by mouth at bedtime.   Multiple Vitamins-Minerals (THEREMS-M) TABS Take 400 mcg by mouth daily in the afternoon.   NON FORMULARY Diet Regular, Consistent Carbohydrate   omeprazole (PRILOSEC) 40 MG capsule TAKE 1 CAPSULE (40 MG TOTAL) BY MOUTH DAILY.   [DISCONTINUED] amLODipine (NORVASC) 10 MG tablet Take 1 tablet (10 mg total) by mouth daily. Please fill as a 90 day supply   [DISCONTINUED] glipiZIDE (GLUCOTROL) 5 MG tablet Take 1 tablet (5 mg total) by mouth daily before breakfast. Please fill as a 90 day supply (Patient taking differently: Take 5 mg by mouth daily before breakfast.)   [DISCONTINUED] insulin aspart protamine - aspart (NOVOLOG 70/30 FLEXPEN RELION) (70-30) 100 UNIT/ML FlexPen Inject 0.2 mLs (20 Units total) into the skin 2 (two) times daily.   [DISCONTINUED] insulin glargine (LANTUS SOLOSTAR) 100 UNIT/ML Solostar Pen Inject 42 Units into the skin 2 (two) times daily.   [DISCONTINUED] insulin lispro (HUMALOG) 100 UNIT/ML KwikPen inject 22 units 3 times daily add 5 units if blood glucose is greater than 150 (Patient not taking: Reported on 02/07/2021)   [DISCONTINUED] loratadine (CLARITIN) 10 MG tablet Take 1 tablet (10 mg total) by mouth daily. Please fill as a 90 day supply (Patient taking differently: Take 10 mg by mouth daily.)   No facility-administered encounter medications on file as of 02/26/2021.     REVIEW OF SYSTEMS  : All other systems reviewed and negative except where noted in the History of Present Illness.   PHYSICAL EXAM: BP 118/62   Pulse 88   Ht 5\' 11"  (1.803 m)   Wt 155 lb 3.2 oz (70.4 kg)   BMI 21.65 kg/m  General: Well developed white male in no acute distress; deep jaundice noted Head: Normocephalic and  atraumatic Eyes  Deep scleral icterus noted. Ears: Normal auditory acuity Lungs: Clear throughout to auscultation; no W/R/R. Heart: Regular rate and rhythm; no M/R/G. Abdomen: Soft, non-distended.  BS present.  Non-tender.  There is fullness in the RUQ and epigastrium that I am concerned is likely due to a malignancy. Musculoskeletal: Symmetrical with no gross deformities  Skin: No lesions on visible extremities Extremities: No edema  Neurological: Alert oriented x 4, grossly non-focal Psychological:  Alert and cooperative. Normal mood and affect  ASSESSMENT AND PLAN: *60 year old male with past medical history of hep C that has been cured, but cirrhosis noted by imaging/FibroScan previously.  Previous alcohol use, but none in 8 months.  Presents with 30 pound weight  loss, is obviously jaundiced in the office, right upper quadrant abdominal discomfort, and oily stools with increased frequency and incontinence.  Stool for C. difficile is negative.  Has not been seen by [place] since 2018.  He has fullness in his right upper quadrant and epigastrium.  I am highly concerned for either a primary pancreatic or liver cancer.  He is deeply jaundiced, but does not appear toxic.  He is going to get some labs done and we are going to get a stat CT scan of the abdomen and pelvis with contrast today.  Advised him that pending those results he may need to be sent to the hospital for expedited evaluation.   CC:  Ladell Pier, MD

## 2021-02-26 NOTE — Telephone Encounter (Signed)
Patient is returning your call for lab results. 

## 2021-02-26 NOTE — Telephone Encounter (Signed)
The pt has been advised and instructed for ERCP tomorrow at 145 pm at [facility].  He will arrive at 1215 pm.  He was asked to call back if he has any questions or concerns.

## 2021-02-26 NOTE — Progress Notes (Signed)
Agree with assessment and plan as outlined. Patient is jaundiced with bilirubin of 25 and a new pancreatic mass causing CBD obstruction. WBC normal, he does not appear to have cholangitis. Case was discussed with our advanced endoscopy colleagues and he is scheduled for expedited outpatient ERCP tomorrow to relieve his biliary obstruction and keep him out of the hospital, appreciate Dr. Ardis Hughs helping this patient out so quickly. Will await his procedure results tomorrow.

## 2021-02-26 NOTE — Patient Instructions (Signed)
If you are age 59 or older, your body mass index should be between 23-30. Your Body mass index is 21.65 kg/m. If this is out of the aforementioned range listed, please consider follow up with your Primary Care Provider.  If you are age 25 or younger, your body mass index should be between 19-25. Your Body mass index is 21.65 kg/m. If this is out of the aformentioned range listed, please consider follow up with your Primary Care Provider.   Your provider has requested that you go to the basement level for lab work before leaving today. Press "B" on the elevator. The lab is located at the first door on the left as you exit the elevator.  You have been scheduled for a CT scan of the abdomen and pelvis at [facility] ([address]---this is in the same building as [employer]).   You are scheduled on 02/26/21 at 1pm. You should arrive 15 minutes prior to your appointment time for registration. Please follow the written instructions below on the day of your exam:  WARNING: IF YOU ARE ALLERGIC TO IODINE/X-RAY DYE, PLEASE NOTIFY RADIOLOGY IMMEDIATELY AT [phone]! YOU WILL BE GIVEN A 13 HOUR PREMEDICATION PREP.  1) Do not eat or drink anything after 9:47am (4 hours prior to your test) 2) You have been given 2 bottles of oral contrast to drink. The solution may taste better if refrigerated, but do NOT add ice or any other liquid to this solution. Shake well before drinking.    Drink 1 bottle of contrast @ 11am (2 hours prior to your exam)  Drink 1 bottle of contrast @ 12pm (1 hour prior to your exam)  You may take any medications as prescribed with a small amount of water, if necessary. If you take any of the following medications: METFORMIN, GLUCOPHAGE, GLUCOVANCE, AVANDAMET, RIOMET, FORTAMET, [place] MET, JANUMET, GLUMETZA or METAGLIP, you MAY be asked to HOLD this medication 48 hours AFTER the exam.  The purpose of you drinking the oral contrast is to aid in the  visualization of your intestinal tract. The contrast solution may cause some diarrhea. Depending on your individual set of symptoms, you may also receive an intravenous injection of x-ray contrast/dye. Plan on being at [facility] for 30 minutes or longer, depending on the type of exam you are having performed.  This test typically takes 30-45 minutes to complete.  If you have any questions regarding your exam or if you need to reschedule, you may call the CT department at [phone] between the hours of 8:00 am and 5:00 pm, Monday-Friday.   The [facility] providers would like to encourage you to use [facility] to communicate with providers for non-urgent requests or questions.  Due to long hold times on the telephone, sending your provider a message by [facility] may be a faster and more efficient way to get a response.  Please allow 48 business hours for a response.  Please remember that this is for non-urgent requests.    Thank you for choosing me and [facility].  Alonza Bogus, PA-C

## 2021-02-27 ENCOUNTER — Other Ambulatory Visit: Payer: Self-pay

## 2021-02-27 ENCOUNTER — Encounter (HOSPITAL_COMMUNITY): Payer: Self-pay | Admitting: Gastroenterology

## 2021-02-27 ENCOUNTER — Ambulatory Visit (HOSPITAL_COMMUNITY): Payer: Medicaid Other

## 2021-02-27 ENCOUNTER — Ambulatory Visit (HOSPITAL_COMMUNITY)
Admission: RE | Admit: 2021-02-27 | Discharge: 2021-02-27 | Disposition: A | Discharge status: Home / Self Care | Payer: Medicaid Other | Attending: Gastroenterology | Admitting: Gastroenterology

## 2021-02-27 ENCOUNTER — Ambulatory Visit (HOSPITAL_COMMUNITY): Payer: Medicaid Other | Admitting: Anesthesiology

## 2021-02-27 ENCOUNTER — Encounter (HOSPITAL_COMMUNITY)
Admission: RE | Disposition: A | Discharge status: Home / Self Care | Payer: Self-pay | Source: Home / Self Care | Attending: Gastroenterology

## 2021-02-27 ENCOUNTER — Telehealth: Payer: Self-pay

## 2021-02-27 DIAGNOSIS — Z91030 Bee allergy status: Secondary | ICD-10-CM | POA: Insufficient documentation

## 2021-02-27 DIAGNOSIS — K7460 Unspecified cirrhosis of liver: Secondary | ICD-10-CM | POA: Diagnosis not present

## 2021-02-27 DIAGNOSIS — Z6820 Body mass index (BMI) 20.0-20.9, adult: Secondary | ICD-10-CM | POA: Diagnosis not present

## 2021-02-27 DIAGNOSIS — Z79899 Other long term (current) drug therapy: Secondary | ICD-10-CM | POA: Insufficient documentation

## 2021-02-27 DIAGNOSIS — Z8619 Personal history of other infectious and parasitic diseases: Secondary | ICD-10-CM | POA: Diagnosis not present

## 2021-02-27 DIAGNOSIS — R634 Abnormal weight loss: Secondary | ICD-10-CM | POA: Diagnosis not present

## 2021-02-27 DIAGNOSIS — Z791 Long term (current) use of non-steroidal anti-inflammatories (NSAID): Secondary | ICD-10-CM | POA: Insufficient documentation

## 2021-02-27 DIAGNOSIS — K8689 Other specified diseases of pancreas: Secondary | ICD-10-CM

## 2021-02-27 DIAGNOSIS — E1140 Type 2 diabetes mellitus with diabetic neuropathy, unspecified: Secondary | ICD-10-CM | POA: Diagnosis not present

## 2021-02-27 DIAGNOSIS — Z794 Long term (current) use of insulin: Secondary | ICD-10-CM | POA: Diagnosis not present

## 2021-02-27 DIAGNOSIS — R159 Full incontinence of feces: Secondary | ICD-10-CM | POA: Diagnosis not present

## 2021-02-27 DIAGNOSIS — K831 Obstruction of bile duct: Secondary | ICD-10-CM | POA: Diagnosis not present

## 2021-02-27 DIAGNOSIS — Z7982 Long term (current) use of aspirin: Secondary | ICD-10-CM | POA: Insufficient documentation

## 2021-02-27 DIAGNOSIS — K862 Cyst of pancreas: Secondary | ICD-10-CM

## 2021-02-27 DIAGNOSIS — Z87892 Personal history of anaphylaxis: Secondary | ICD-10-CM | POA: Diagnosis not present

## 2021-02-27 DIAGNOSIS — C259 Malignant neoplasm of pancreas, unspecified: Secondary | ICD-10-CM

## 2021-02-27 DIAGNOSIS — F17210 Nicotine dependence, cigarettes, uncomplicated: Secondary | ICD-10-CM | POA: Insufficient documentation

## 2021-02-27 DIAGNOSIS — C250 Malignant neoplasm of head of pancreas: Secondary | ICD-10-CM | POA: Insufficient documentation

## 2021-02-27 DIAGNOSIS — Z9049 Acquired absence of other specified parts of digestive tract: Secondary | ICD-10-CM | POA: Diagnosis not present

## 2021-02-27 DIAGNOSIS — Z800 Family history of malignant neoplasm of digestive organs: Secondary | ICD-10-CM | POA: Diagnosis not present

## 2021-02-27 HISTORY — PX: BILIARY STENT PLACEMENT: SHX5538

## 2021-02-27 HISTORY — PX: EUS: SHX5427

## 2021-02-27 HISTORY — PX: FINE NEEDLE ASPIRATION: SHX5430

## 2021-02-27 HISTORY — PX: SPHINCTEROTOMY: SHX5544

## 2021-02-27 HISTORY — PX: ENDOSCOPIC RETROGRADE CHOLANGIOPANCREATOGRAPHY (ERCP) WITH PROPOFOL: SHX5810

## 2021-02-27 LAB — AFP TUMOR MARKER: AFP-Tumor Marker: 5.7 ng/mL (ref <6.1)

## 2021-02-27 LAB — GLUCOSE, CAPILLARY: Glucose-Capillary: 254 mg/dL — ABNORMAL HIGH (ref 70–99)

## 2021-02-27 SURGERY — UPPER ENDOSCOPIC ULTRASOUND (EUS) RADIAL
Anesthesia: General

## 2021-02-27 MED ORDER — SODIUM CHLORIDE 0.9 % IV SOLN: INTRAVENOUS | Status: DC

## 2021-02-27 MED ORDER — GLUCAGON HCL RDNA (DIAGNOSTIC) 1 MG IJ SOLR
INTRAMUSCULAR | Status: AC
  Filled 2021-02-27: qty 1

## 2021-02-27 MED ORDER — DEXAMETHASONE SODIUM PHOSPHATE 10 MG/ML IJ SOLN
INTRAMUSCULAR | Status: DC | PRN
  Administered 2021-02-27: 10 mg via INTRAVENOUS

## 2021-02-27 MED ORDER — EPHEDRINE SULFATE-NACL 50-0.9 MG/10ML-% IV SOSY
PREFILLED_SYRINGE | INTRAVENOUS | Status: DC | PRN
  Administered 2021-02-27: 10 mg via INTRAVENOUS

## 2021-02-27 MED ORDER — PROPOFOL 10 MG/ML IV BOLUS
INTRAVENOUS | Status: DC | PRN
  Administered 2021-02-27: 150 mg via INTRAVENOUS

## 2021-02-27 MED ORDER — FENTANYL CITRATE (PF) 100 MCG/2ML IJ SOLN
INTRAMUSCULAR | Status: DC | PRN
  Administered 2021-02-27 (×2): 50 ug via INTRAVENOUS

## 2021-02-27 MED ORDER — FENTANYL CITRATE (PF) 100 MCG/2ML IJ SOLN
INTRAMUSCULAR | Status: AC
  Filled 2021-02-27: qty 2

## 2021-02-27 MED ORDER — LIDOCAINE 2% (20 MG/ML) 5 ML SYRINGE
INTRAMUSCULAR | Status: DC | PRN
  Administered 2021-02-27: 80 mg via INTRAVENOUS

## 2021-02-27 MED ORDER — ROCURONIUM BROMIDE 10 MG/ML (PF) SYRINGE
PREFILLED_SYRINGE | INTRAVENOUS | Status: DC | PRN
  Administered 2021-02-27: 70 mg via INTRAVENOUS

## 2021-02-27 MED ORDER — PROPOFOL 10 MG/ML IV BOLUS
INTRAVENOUS | Status: AC
  Filled 2021-02-27: qty 20

## 2021-02-27 MED ORDER — INDOMETHACIN 50 MG RE SUPP
RECTAL | Status: DC | PRN
  Administered 2021-02-27: 100 mg via RECTAL

## 2021-02-27 MED ORDER — PHENYLEPHRINE HCL (PRESSORS) 10 MG/ML IV SOLN
INTRAVENOUS | Status: AC
  Filled 2021-02-27: qty 1

## 2021-02-27 MED ORDER — INDOMETHACIN 50 MG RE SUPP
RECTAL | Status: AC
  Filled 2021-02-27: qty 2

## 2021-02-27 MED ORDER — ONDANSETRON HCL 4 MG/2ML IJ SOLN
INTRAMUSCULAR | Status: DC | PRN
  Administered 2021-02-27: 4 mg via INTRAVENOUS

## 2021-02-27 MED ORDER — MIDAZOLAM HCL 2 MG/2ML IJ SOLN
INTRAMUSCULAR | Status: AC
  Filled 2021-02-27: qty 2

## 2021-02-27 MED ORDER — PHENYLEPHRINE 40 MCG/ML (10ML) SYRINGE FOR IV PUSH (FOR BLOOD PRESSURE SUPPORT)
PREFILLED_SYRINGE | INTRAVENOUS | Status: DC | PRN
  Administered 2021-02-27 (×3): 80 ug via INTRAVENOUS

## 2021-02-27 MED ORDER — SODIUM CHLORIDE (PF) 0.9 % IJ SOLN
INTRAMUSCULAR | Status: DC | PRN
  Administered 2021-02-27: 20 mL

## 2021-02-27 MED ORDER — LACTATED RINGERS IV SOLN: INTRAVENOUS | Status: DC

## 2021-02-27 MED ORDER — MIDAZOLAM HCL 2 MG/2ML IJ SOLN
INTRAMUSCULAR | Status: DC | PRN
  Administered 2021-02-27: 2 mg via INTRAVENOUS

## 2021-02-27 MED ORDER — PHENYLEPHRINE HCL-NACL 10-0.9 MG/250ML-% IV SOLN
INTRAVENOUS | Status: DC | PRN
  Administered 2021-02-27: 20 ug/min via INTRAVENOUS

## 2021-02-27 MED ORDER — SUGAMMADEX SODIUM 200 MG/2ML IV SOLN
INTRAVENOUS | Status: DC | PRN
  Administered 2021-02-27: 200 mg via INTRAVENOUS

## 2021-02-27 MED ORDER — CIPROFLOXACIN IN D5W 400 MG/200ML IV SOLN
INTRAVENOUS | Status: AC
  Filled 2021-02-27: qty 200

## 2021-02-27 NOTE — Telephone Encounter (Signed)
Referral made to [facility] and med oncology via [facility] records faxed.  CT chest ordered and scheduled for 03/05/21 at 5 pm at [facility] to arrive at 445 pm.  NPO 4 hours prior.   The pt has been advised of all information. I will also provide him with the information via My Chart.

## 2021-02-27 NOTE — Anesthesia Postprocedure Evaluation (Signed)
Anesthesia Post Note  Patient: [name] [name]  Procedure([name]) Performed: UPPER ENDOSCOPIC ULTRASOUND (EUS) RADIAL ENDOSCOPIC RETROGRADE CHOLANGIOPANCREATOGRAPHY (ERCP) WITH PROPOFOL FINE NEEDLE ASPIRATION (FNA) LINEAR BILIARY STENT PLACEMENT SPHINCTEROTOMY     Patient location during evaluation: PACU Anesthesia Type: General Level of consciousness: awake and alert Pain management: pain level controlled Vital Signs Assessment: post-procedure vital signs reviewed and stable Respiratory status: spontaneous breathing, nonlabored ventilation, respiratory function stable and patient connected to nasal cannula oxygen Cardiovascular status: blood pressure returned to baseline and stable Postop Assessment: no apparent nausea or vomiting Anesthetic complications: no   No notable events documented.  Last Vitals:  Vitals:   02/27/21 1448 02/27/21 1458  BP: 140/69 137/70  Pulse: 87 82  Resp: (!) 22 20  Temp:    SpO2: 100% 97%    Last Pain:  Vitals:   02/27/21 1458  TempSrc:   PainSc: 6                  Deziyah Arvin,[name] [name]

## 2021-02-27 NOTE — Transfer of Care (Signed)
Immediate Anesthesia Transfer of Care Note  Patient: [name] [name]  Procedure(s) Performed: UPPER ENDOSCOPIC ULTRASOUND (EUS) RADIAL ENDOSCOPIC RETROGRADE CHOLANGIOPANCREATOGRAPHY (ERCP) WITH PROPOFOL FINE NEEDLE ASPIRATION (FNA) LINEAR BILIARY STENT PLACEMENT SPHINCTEROTOMY  Patient Location: Endoscopy Unit  Anesthesia Type:General  Level of Consciousness: awake, alert  and oriented  Airway & Oxygen Therapy: Patient Spontanous Breathing and Patient connected to face mask oxygen  Post-op Assessment: Report given to RN and Post -op Vital signs reviewed and stable  Post vital signs: Reviewed and stable  Last Vitals:  Vitals Value Taken Time  BP 140/69 02/27/21 1448  Temp    Pulse 85 02/27/21 1449  Resp 27 02/27/21 1449  SpO2 100 % 02/27/21 1449  Vitals shown include unvalidated device data.  Last Pain:  Vitals:   02/27/21 1156  TempSrc: Oral  PainSc: 7       Patients Stated Pain Goal: 0 (28/83/37 4451)  Complications: No notable events documented.

## 2021-02-27 NOTE — Telephone Encounter (Signed)
-----   Message from Milus Banister, MD sent at 02/27/2021  2:45 PM EDT ----- Cira Rue,  Just completed his EUS and ERCP.  All went smoothly, pancreatic adeno confirmed on preliminary cytology and 10Fr 5cm biliary stent placed.    Cedar Roseman, He needs referral to [facility] or [facility]) and medical oncology for newly diagnosed pancreatic adenocarcinoma. Also staging chest CT.  Thanks!

## 2021-02-27 NOTE — Anesthesia Preprocedure Evaluation (Signed)
Anesthesia Evaluation  Patient identified by MRN, date of birth, ID band Patient awake    Reviewed: Allergy & Precautions, NPO status , Patient's Chart, lab work & pertinent test results  Airway Mallampati: II  TM Distance: >3 FB Neck ROM: Full    Dental no notable dental hx.    Pulmonary Current Smoker,    Pulmonary exam normal breath sounds clear to auscultation       Cardiovascular hypertension, Normal cardiovascular exam Rhythm:Regular Rate:Normal     Neuro/Psych negative neurological ROS  negative psych ROS   GI/Hepatic GERD  ,(+) Hepatitis -, CPancreatic cancer   Endo/Other  negative endocrine ROSdiabetes  Renal/GU negative Renal ROS  negative genitourinary   Musculoskeletal negative musculoskeletal ROS (+)   Abdominal   Peds negative pediatric ROS (+)  Hematology negative hematology ROS (+)   Anesthesia Other Findings   Reproductive/Obstetrics negative OB ROS                             Anesthesia Physical Anesthesia Plan  ASA: 3  Anesthesia Plan: General   Post-op Pain Management:    Induction: Intravenous  PONV Risk Score and Plan: 1 and Ondansetron, Dexamethasone and Treatment may vary due to age or medical condition  Airway Management Planned: Oral ETT  Additional Equipment:   Intra-op Plan:   Post-operative Plan: Extubation in OR  Informed Consent: I have reviewed the patients History and Physical, chart, labs and discussed the procedure including the risks, benefits and alternatives for the proposed anesthesia with the patient or authorized representative who has indicated his/her understanding and acceptance.     Dental advisory given  Plan Discussed with: CRNA and Surgeon  Anesthesia Plan Comments:         Anesthesia Quick Evaluation

## 2021-02-27 NOTE — Interval H&P Note (Signed)
History and Physical Interval Note:  02/27/2021 11:47 AM  [name] [name]  has presented today for surgery, with the diagnosis of pancreatic adenocarcinoma.  The various methods of treatment have been discussed with the patient and family. After consideration of risks, benefits and other options for treatment, the patient has consented to  Procedure(s): UPPER ENDOSCOPIC ULTRASOUND (EUS) RADIAL (N/A) ENDOSCOPIC RETROGRADE CHOLANGIOPANCREATOGRAPHY (ERCP) WITH PROPOFOL (N/A) as a surgical intervention.  The patient's history has been reviewed, patient examined, no change in status, stable for surgery.  I have reviewed the patient's chart and labs.  Questions were answered to the patient's satisfaction.     Milus Banister

## 2021-02-27 NOTE — Anesthesia Procedure Notes (Signed)
Procedure Name: Intubation Date/Time: 02/27/2021 1:13 PM Performed by: Genelle Bal, CRNA Pre-anesthesia Checklist: Patient identified, Emergency Drugs available, Suction available and Patient being monitored Patient Re-evaluated:Patient Re-evaluated prior to induction Oxygen Delivery Method: Circle system utilized Preoxygenation: Pre-oxygenation with 100% oxygen Induction Type: IV induction Ventilation: Mask ventilation without difficulty Laryngoscope Size: Miller and 2 Grade View: Grade I Tube type: Oral Tube size: 7.5 mm Number of attempts: 1 Airway Equipment and Method: Stylet and Oral airway Placement Confirmation: ETT inserted through vocal cords under direct vision, positive ETCO2 and breath sounds checked- equal and bilateral Tube secured with: Tape Dental Injury: Teeth and Oropharynx as per pre-operative assessment

## 2021-02-27 NOTE — Op Note (Signed)
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 Estimated blood loss:                            None Estimated Blood Loss:     Estimated blood loss: none. Procedure:                Pre-Anesthesia Assessment:                           - Prior to the procedure, a History and Physical                            was performed, and patient medications and                            allergies were reviewed. The patient's tolerance of                            previous anesthesia was also reviewed. The risks                            and benefits of the procedure and the sedation                            options and risks were discussed with the patient.                            All questions were answered, and informed consent                            was obtained. Prior Anticoagulants: The patient has                            taken no  previous anticoagulant or antiplatelet                            agents. ASA Grade Assessment: II - A patient with                            mild systemic disease. After reviewing the risks                            and benefits, the patient was deemed in                            satisfactory condition to undergo the procedure.                            After obtaining informed consent,  the scope was                            passed under direct vision. Throughout the                            procedure, the patient's blood pressure, pulse, and                            oxygen saturations were monitored continuously. The                            [id number]-Q180V ([id number]) Olympus Duodenoscope was                            introduced through the mouth, and used to inject                            contrast into and used to inject contrast into the                            bile duct. The ERCP was accomplished without                            difficulty. The patient tolerated the procedure                            well. Scope In: Scope Out: Findings:      The scout film was normal. The duodenoscope was advanced to the region       of the major papilla without detailed examination of the UGI tract. The       major papilla was normal appearing. A 44 Autotome over a .035 hydrawire       was used to cannulate the bile duct and then contrast was injected.       Cholangiogram revealed a tight, 1.5cm long distal CBD stricture. The       CBD, CHD proximal to the stricture were dilated (CBD 2mm) without any       obvious filling defects. The cystic duct did not opacify. I  performed a       minor biliary sphincterotomy over the wire to facilitate placement of a       10 Fr 5cm long plastic biliary stent across the malignant stricture with       the most distsl 7-84mm of the stent extending into the duodenum. There       was brisk flow of dark old bile through the stent. The main pancreatic       duct was cannulated with the wire twice but never injected with contrast. 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 Recommendation:           - Discharge patient to home.                           - [facility] will refer to medical oncology and                            [facility] and will also order staging CT scan of                            the chest. Procedure Code(s):        --- Professional ---                           [phone], Endoscopic retrograde                            cholangiopancreatography (ERCP); with placement of                            endoscopic stent into biliary or pancreatic duct,                            including pre- and post-dilation and guide wire                            passage, when performed, including sphincterotomy,                            when performed, each stent Diagnosis Code(s):        --- Professional ---                           K83.1, Obstruction of bile duct CPT copyright 2019 American Medical Association. All  rights reserved. The codes documented in this report are preliminary and upon coder review may  be revised to meet current compliance requirements. Milus Banister, MD 02/27/2021 2:43:34 PM This report has been signed electronically. Number of Addenda: 0

## 2021-02-27 NOTE — Discharge Instructions (Signed)
YOU HAD AN ENDOSCOPIC PROCEDURE TODAY: Refer to the procedure report and other information in the discharge instructions given to you for any specific questions about what was found during the examination. If this information does not answer your questions, please call [facility] office at [phone] to clarify.  ° °YOU SHOULD EXPECT: Some feelings of bloating in the abdomen. Passage of more gas than usual. Walking can help get rid of the air that was put into your GI tract during the procedure and reduce the bloating.  ° °DIET: Your first meal following the procedure should be a light meal and then it is ok to progress to your normal diet. A half-sandwich or bowl of soup is an example of a good first meal. Heavy or fried foods are harder to digest and may make you feel nauseous or bloated. Drink plenty of fluids but you should avoid alcoholic beverages for 24 hours. ° °ACTIVITY: Your care partner should take you home directly after the procedure. You should plan to take it easy, moving slowly for the rest of the day. You can resume normal activity the day after the procedure however YOU SHOULD NOT DRIVE, use power tools, machinery or perform tasks that involve climbing or major physical exertion for 24 hours (because of the sedation medicines used during the test).  ° °SYMPTOMS TO REPORT IMMEDIATELY: °A gastroenterologist can be reached at any hour. Please call [phone]  for any of the following symptoms:  ° °Following upper endoscopy (EGD, EUS, ERCP, esophageal dilation) °Vomiting of blood or coffee ground material  °New, significant abdominal pain  °New, significant chest pain or pain under the shoulder blades  °Painful or persistently difficult swallowing  °New shortness of breath  °Black, tarry-looking or red, bloody stools ° °FOLLOW UP:  °If any biopsies were taken you will be contacted by phone or by letter within the next 1-3 weeks. Call [phone]  if you have not heard about the biopsies in 3 weeks.    °Please also call with any specific questions about appointments or follow up tests. ° °

## 2021-02-27 NOTE — Op Note (Signed)
[facility] Patient Name: [name] [name] Procedure Date: 02/27/2021 MRN: [id number] Attending MD: Milus Banister , MD Date of Birth: August 11, 1961 CSN: [id number] Age: 60 Admit Type: Outpatient Procedure:                Upper EUS Indications:              Painless jaundice, 40 pound weight loss, CT                            yesterday shows 4.4cm mass in head of pancreas Providers:                Milus Banister, MD, [name], RN, Lesia Sago, Technician Referring MD:             Jolly Mango, MD Medicines:                Monitored Anesthesia Care Complications:            No immediate complications. Estimated blood loss:                            None. Estimated Blood Loss:     Estimated blood loss: none. Procedure:                Pre-Anesthesia Assessment:                           - Prior to the procedure, a History and Physical                            was performed, and patient medications and                            allergies were reviewed. The patient's tolerance of                            previous anesthesia was also reviewed. The risks                            and benefits of the procedure and the sedation                            options and risks were discussed with the patient.                            All questions were answered, and informed consent                            was obtained. Prior Anticoagulants: The patient has                            taken no previous anticoagulant or antiplatelet  agents. ASA Grade Assessment: II - A patient with                            mild systemic disease. After reviewing the risks                            and benefits, the patient was deemed in                            satisfactory condition to undergo the procedure.                           After obtaining informed consent, the endoscope was                             passed under direct vision. Throughout the                            procedure, the patient's blood pressure, pulse, and                            oxygen saturations were monitored continuously. The                            [id number] ([id number]) Olympus was introduced through                            the mouth, and advanced to the second part of                            duodenum. The [id number] ([id number]) Olympus Linear                            EUS was introduced through the mouth, and advanced                            to the second part of duodenum. The upper EUS was                            accomplished without difficulty. The patient                            tolerated the procedure well. Scope In: Scope Out: Findings:      ENDOSCOPIC FINDING (limited views with radial and linear EUS scopes): :      The examined esophagus was endoscopically normal.      The entire examined stomach was endoscopically normal.      The examined duodenum was endoscopically normal.      ENDOSONOGRAPHIC FINDING: :      1. Hypoechoic, heterogeneous 4.3cm mass in the head of the pancreas that       is causing obstruction of the CBD (dilated to 67mm) and clearly invades       the portal vein/SMV for 1-2cm. The mass also obstructs the main  pancreatic duct which is dilated in the body and tail to 4-51mm. I       sampled the mass with 3 tranduodenal passes with a 22 gauge EUS FNA       needle, color doppler. The mass does not appear to involve the SMA or       celiac trunk.      2. 1.5cm portocaval LN that appeared malignant by EUS characteristics.      3. The gallbladder was dilated and filled with sludge.      4. Limited views of the liver, spleen were normal. Impression:               - 4.3cm mass in the head of pancreas causing                            obstruction of the biliary and pancreatic ducts and                            clearly invading the main portal vein/SMV near the                             confluence for 1-2cm. Suspicious, enlarged                            porto-caval node. Preliminary cytology review of                            the mass was + for malignancy (adenocarcinoma). Moderate Sedation:      Not Applicable - Patient had care per Anesthesia. Recommendation:           - Await final cytology results.                           - ERCP today for biliary decompression. Procedure Code(s):        --- Professional ---                           [phone], Esophagogastroduodenoscopy, flexible,                            transoral; with transendoscopic ultrasound-guided                            intramural or transmural fine needle                            aspiration/biopsy(s), (includes endoscopic                            ultrasound examination limited to the esophagus,                            stomach or duodenum, and adjacent structures) Diagnosis Code(s):        --- Professional ---                           Y78.29, Other specified  diseases of pancreas                           R93.3, Abnormal findings on diagnostic imaging of                            other parts of digestive tract CPT copyright 2019 American Medical Association. All rights reserved. The codes documented in this report are preliminary and upon coder review may  be revised to meet current compliance requirements. Milus Banister, MD 02/27/2021 2:05:35 PM This report has been signed electronically. Number of Addenda: 0

## 2021-02-28 ENCOUNTER — Telehealth: Payer: Self-pay

## 2021-02-28 ENCOUNTER — Telehealth: Payer: Self-pay | Admitting: Hematology and Oncology

## 2021-02-28 NOTE — Telephone Encounter (Signed)
error 

## 2021-02-28 NOTE — Telephone Encounter (Signed)
Received anew pt referral from Dr. Ardis Hughs for pancreatic cancer. [name] has been cld and scheduled to see Dr. Lorenso Courier on 7/13 at 9am. Pt aware to arrive 15 minutes early.

## 2021-03-04 ENCOUNTER — Telehealth: Payer: Self-pay | Admitting: Nurse Practitioner

## 2021-03-04 ENCOUNTER — Ambulatory Visit: Payer: Self-pay | Attending: Nurse Practitioner

## 2021-03-04 ENCOUNTER — Other Ambulatory Visit: Payer: Self-pay

## 2021-03-04 ENCOUNTER — Other Ambulatory Visit: Payer: Self-pay | Admitting: Nurse Practitioner

## 2021-03-04 ENCOUNTER — Other Ambulatory Visit: Payer: Self-pay | Admitting: Adult Health

## 2021-03-04 DIAGNOSIS — M2550 Pain in unspecified joint: Secondary | ICD-10-CM

## 2021-03-04 DIAGNOSIS — I10 Essential (primary) hypertension: Secondary | ICD-10-CM

## 2021-03-04 DIAGNOSIS — IMO0002 Reserved for concepts with insufficient information to code with codable children: Secondary | ICD-10-CM

## 2021-03-04 LAB — CYTOLOGY - NON PAP

## 2021-03-04 MED ORDER — TRUEPLUS PEN NEEDLES 32G X 4 MM MISC
6 refills | Status: DC
  Filled 2021-03-04: qty 100, 50d supply, fill #0
  Filled 2021-04-11 – 2021-05-20 (×2): qty 100, 50d supply, fill #1

## 2021-03-04 MED ORDER — TRUE METRIX BLOOD GLUCOSE TEST VI STRP
ORAL_STRIP | 12 refills | Status: DC
  Filled 2021-03-04: qty 100, 25d supply, fill #0
  Filled 2021-07-03: qty 100, 25d supply, fill #1

## 2021-03-04 MED ORDER — TRUEPLUS LANCETS 28G MISC
3 refills | Status: DC
Start: 2021-03-04 — End: 2021-07-04
  Filled 2021-03-04: qty 100, 25d supply, fill #0

## 2021-03-04 NOTE — Telephone Encounter (Signed)
   Notes to clinic:  medication filled by a different provider  Review for continued use and refill   Requested Prescriptions  Pending Prescriptions Disp Refills   losartan (COZAAR) 50 MG tablet 30 tablet 0    Sig: TAKE 1 TABLET (50 MG TOTAL) BY MOUTH DAILY. PLEASE FILL AS A 90 DAY SUPPLY      There is no refill protocol information for this order      meloxicam (MOBIC) 7.5 MG tablet 30 tablet 0    Sig: TAKE 1 TABLET (7.5 MG TOTAL) BY MOUTH DAILY.      There is no refill protocol information for this order

## 2021-03-04 NOTE — Telephone Encounter (Signed)
1) Medication(s) Requested (by name):  Gm  Patient requesting  Test Strips , Lancets and needles  Thank you    2) Pharmacy of Choice: [facility]   3) Special Requests:   Approved medications will be sent to the pharmacy, we will reach out if there is an issue.  Requests made after 3pm may not be addressed until the following business day!  If a patient is unsure of the name of the medication(s) please note and ask patient to call back when they are able to provide all info, do not send to responsible party until all information is available!

## 2021-03-05 ENCOUNTER — Encounter: Payer: Self-pay | Admitting: Hematology and Oncology

## 2021-03-05 ENCOUNTER — Ambulatory Visit (HOSPITAL_COMMUNITY)
Admission: RE | Admit: 2021-03-05 | Discharge: 2021-03-05 | Disposition: A | Discharge status: Home / Self Care | Payer: Medicaid Other | Source: Ambulatory Visit | Attending: Gastroenterology | Admitting: Gastroenterology

## 2021-03-05 ENCOUNTER — Other Ambulatory Visit: Payer: Self-pay

## 2021-03-05 DIAGNOSIS — C259 Malignant neoplasm of pancreas, unspecified: Secondary | ICD-10-CM | POA: Insufficient documentation

## 2021-03-05 MED ORDER — IOHEXOL 300 MG/ML  SOLN
60.0000 mL | Freq: Once | INTRAMUSCULAR | Status: AC | PRN
  Administered 2021-03-05: 60 mL via INTRAVENOUS

## 2021-03-05 MED ORDER — MELOXICAM 7.5 MG PO TABS
ORAL_TABLET | Freq: Every day | ORAL | 1 refills | Status: DC
  Filled 2021-03-05: qty 30, 30d supply, fill #0

## 2021-03-05 MED ORDER — SODIUM CHLORIDE (PF) 0.9 % IJ SOLN
INTRAMUSCULAR | Status: AC
  Filled 2021-03-05: qty 50

## 2021-03-05 MED ORDER — LOSARTAN POTASSIUM 50 MG PO TABS
ORAL_TABLET | ORAL | 1 refills | Status: DC
  Filled 2021-03-05: qty 30, 30d supply, fill #0

## 2021-03-05 NOTE — Progress Notes (Signed)
Patient called with questions regarding Medicaid and financial assistance w/ new diagnosis.  Asked patient if he had applied for Medicaid and he states yes and was denied and for SSI. Advised patient he will automatically receive a 57% discount for services billed through [facility] and he may apply for additional discount by completing the CFA application. Advised patient I can leave him an application and list of supporting documents to be submitted with application for him to bring on 7/13 to appointment here for copies to be made and sent via interoffice to Customer Service. He verbalized understanding. Hand delivered application in folder with his name on it to [facility] where he is to check in for his CT appointment today. Included my card for any additional financial questions or concerns.    Advised on denial letters should have been an option to appeal and date to do so by. Patient states it was but he  overlooked. Advised once he becomes an established patient at [facility], he will be connected with a Education officer, museum that can further assist him with appointment for SSDI which may come with Medicaid.  He was very appreciative of information provided.

## 2021-03-07 ENCOUNTER — Other Ambulatory Visit: Payer: Self-pay

## 2021-03-10 ENCOUNTER — Other Ambulatory Visit: Payer: Self-pay

## 2021-03-11 NOTE — Progress Notes (Signed)
[place] [phone]   Fax:([phone]) [place] NOTE  Patient Care Team: [name], NP as PCP - General (Nurse Practitioner) Havery Moros, Carlota Raspberry, MD as Consulting Physician (Gastroenterology) Orson Slick, MD as Consulting Physician (Oncology) Royston Bake, RN as Registered Nurse  Hematological/Oncological History # Adenocarcinoma of the Pancreas, Stage IIB 02/26/2021: patient seen by GI for jaundice and pancreatic mass causing CBD obstruction. CT abdomen showed pancreatic head mass (4.4 x 3.7 cm) and enlarged portocaval lymph nodes.  02/27/2021: ERCP with placement of a nonmetallic biliary stent. FNA of pancreatic head showed malignant cells consistent with pancreatic adenocarcinoma.  03/05/2021: CT Chest showed no evidence of metastatic disease in the chest.  03/12/2021: establish care with Dr. Lorenso Courier  CHIEF COMPLAINTS/PURPOSE OF CONSULTATION:  "Adenocarcinoma of the Pancreas "  HISTORY OF PRESENTING ILLNESS:  [name] [name] 60 y.o. male with medical history significant for GERD, chronic hepatitis C, DM type II, and Barrett's esophagus who presents for evaluation of newly diagnosed pancreatic cancer.   On review of the previous records [name] [name] was initially seen by GI on 02/26/2021 for jaundice and pancreatic mass causing CBD obstruction.  A CT abdomen the time showed a pancreatic head mass measuring 4.4 x 3.7 cm with enlarged portacaval lymph nodes.  On 02/27/2021 the patient underwent ERCP with placement of a nonmetallic biliary stent.  FNA of the pancreatic head at the time showed results consistent with pancreatic adenocarcinoma.  On 03/05/2021 patient a CT scan of the chest which showed no evidence of metastatic disease.  Today he presents to establish care with oncology.  On exam today [name] [name] notes that his symptoms began in March after he had a partial amputation of his foot.  He notes that there was a massive change in his  bowel habits right around that time.  He notes he developed terrible diarrhea which was loose and clay colored as well as not particular well formed.  He was seen by GI Dr. At that point.  On further discussion he notes that he currently lives by himself and he does not have any difficulty taking care of his activities of daily living.  He notes that he has lost about 40 pounds in the last 3 to 4 months.  His family history is remarkable for pancreatic cancer in his paternal grandmother breast cancer in his mother and lung cancer in his paternal grandfather.  He was previously a smoker but notes that he just quit upon hearing this diagnosis.  He notes he does not drink any alcohol and previously worked as an Clinical biochemist.  He otherwise denies any fevers, chills, sweats, nausea, vomiting or diarrhea.  A full 10 point ROS is listed below.  MEDICAL HISTORY:  Past Medical History:  Diagnosis Date   Barrett's esophagus dx 2016   Bronchitis    Chronic hepatitis C without hepatic coma ([place]) 10/31/2014   Depression    Diabetes ([place])    ED (erectile dysfunction)    GERD (gastroesophageal reflux disease)    Hepatitis C    Hiatal hernia 10/2014   3cm   Neuropathy    S/P transmetatarsal amputation of foot, left ([place]) 11/28/2020    SURGICAL HISTORY: Past Surgical History:  Procedure Laterality Date   AMPUTATION Left 11/15/2020   Procedure: LEFT TRANSMETATARSALS AMPUTATION;  Surgeon: Newt Minion, MD;  Location: [place];  Service: Orthopedics;  Laterality: Left;   APPENDECTOMY     BILIARY STENT PLACEMENT N/A 02/27/2021  Procedure: BILIARY STENT PLACEMENT;  Surgeon: Milus Banister, MD;  Location: [facility];  Service: Endoscopy;  Laterality: N/A;   COLONOSCOPY N/A 02/16/2014   Procedure: COLONOSCOPY;  Surgeon: Gatha Mayer, MD;  Location: [facility];  Service: Endoscopy;  Laterality: N/A;   COLONOSCOPY     ENDOSCOPIC RETROGRADE CHOLANGIOPANCREATOGRAPHY (ERCP) WITH PROPOFOL N/A 02/27/2021    Procedure: ENDOSCOPIC RETROGRADE CHOLANGIOPANCREATOGRAPHY (ERCP) WITH PROPOFOL;  Surgeon: Milus Banister, MD;  Location: [facility];  Service: Endoscopy;  Laterality: N/A;   EUS N/A 02/27/2021   Procedure: UPPER ENDOSCOPIC ULTRASOUND (EUS) RADIAL;  Surgeon: Milus Banister, MD;  Location: [facility];  Service: Endoscopy;  Laterality: N/A;   FINE NEEDLE ASPIRATION N/A 02/27/2021   Procedure: FINE NEEDLE ASPIRATION (FNA) LINEAR;  Surgeon: Milus Banister, MD;  Location: [facility];  Service: Endoscopy;  Laterality: N/A;   SPHINCTEROTOMY  02/27/2021   Procedure: SPHINCTEROTOMY;  Surgeon: Milus Banister, MD;  Location: [facility];  Service: Endoscopy;;   TONSILLECTOMY      SOCIAL HISTORY: Social History   Socioeconomic History   Marital status: Single    Spouse name: Not on file   Number of children: Not on file   Years of education: Not on file   Highest education level: Not on file  Occupational History   Not on file  Tobacco Use   Smoking status: Every Day    Packs/day: 0.50    Years: 35.00    Pack years: 17.50    Types: Cigarettes   Smokeless tobacco: Never  Vaping Use   Vaping Use: Never used  Substance and Sexual Activity   Alcohol use: Not Currently    Comment: previous   Drug use: No   Sexual activity: Not on file  Other Topics Concern   Not on file  Social History Narrative   Not on file   Social Determinants of Health   Financial Resource Strain: High Risk   Difficulty of Paying Living Expenses: Very hard  Food Insecurity: Not on file  Transportation Needs: Unmet Transportation Needs   Lack of Transportation (Medical): Yes   Lack of Transportation (Non-Medical): Yes  Physical Activity: Not on file  Stress: Not on file  Social Connections: Not on file  Intimate Partner Violence: Not on file    FAMILY HISTORY: Family History  Problem Relation Age of Onset   Breast cancer Mother    Stroke Father    Alcohol abuse Father    Heart disease  Maternal Grandfather    Pancreatic cancer Paternal Grandmother    Diabetes Paternal Grandfather    Colon cancer Neg Hx    Stomach cancer Neg Hx    Rectal cancer Neg Hx    Esophageal cancer Neg Hx     ALLERGIES:  is allergic to bee venom and lactose intolerance (gi).  MEDICATIONS:  Current Outpatient Medications  Medication Sig Dispense Refill   ibuprofen (ADVIL) 800 MG tablet Take 800 mg by mouth every 8 (eight) hours as needed.     lidocaine-prilocaine (EMLA) cream Apply 1 application topically as directed as needed. 30 g 0   lipase/protease/amylase (CREON) 36000 UNITS CPEP capsule Take 2 capsules (72,000 Units total) by mouth 3 (three) times daily with meals. May also take 1 capsule (36,000 Units total) as needed (with snacks). 56 capsule 0   losartan (COZAAR) 50 MG tablet TAKE 1 TABLET (50 MG TOTAL) BY MOUTH DAILY. PLEASE FILL AS A 90 DAY SUPPLY 30 tablet 1   meloxicam (MOBIC) 7.5 MG  tablet TAKE 1 TABLET (7.5 MG TOTAL) BY MOUTH DAILY. 30 tablet 1   ondansetron (ZOFRAN) 8 MG tablet Take 1 tablet (8 mg total) by mouth every 8 (eight) hours as needed for nausea or vomiting. 30 tablet 0   oxyCODONE (OXY IR/ROXICODONE) 5 MG immediate release tablet Take 1-2 tablets (5-10 mg total) by mouth every 6 (six) hours as needed for severe pain. 60 tablet 0   prochlorperazine (COMPAZINE) 10 MG tablet Take 1 tablet (10 mg total) by mouth every 6 (six) hours as needed for nausea or vomiting. 30 tablet 0   acetaminophen (TYLENOL) 325 MG tablet Take 2 tablets (650 mg total) by mouth every 6 (six) hours as needed for mild pain (or Fever >/= 101). (Patient not taking: Reported on 03/12/2021)     albuterol (VENTOLIN HFA) 108 (90 Base) MCG/ACT inhaler Inhale 2 puffs into the lungs every 6 (six) hours as needed for wheezing or shortness of breath. 18 g 0   aspirin EC 81 MG tablet Take 1 tablet (81 mg total) by mouth daily. 30 tablet 11   atorvastatin (LIPITOR) 40 MG tablet TAKE 1 TABLET (40 MG TOTAL) BY MOUTH  DAILY. (Patient taking differently: Take 40 mg by mouth in the morning.) 30 tablet 0   DULoxetine (CYMBALTA) 60 MG capsule TAKE 1 CAPSULE (60 MG TOTAL) BY MOUTH DAILY. (Patient taking differently: Take 60 mg by mouth in the morning.) 90 capsule 1   folic acid (FOLVITE) 1 MG tablet Take 1 tablet (1 mg total) by mouth daily. (Patient taking differently: Take 1 mg by mouth in the morning.) 30 tablet 0   glipiZIDE (GLUCOTROL) 5 MG tablet Take 5 mg by mouth daily before breakfast. Please fill as a 90 day supply 90 tablet 1   glucose blood (TRUE METRIX BLOOD GLUCOSE TEST) test strip Use as instructed. Check blood glucose level by fingerstick 3-4 times per day.  E11.65 200 each 12   insulin glargine (LANTUS) 100 UNIT/ML injection Inject 30 Units into the skin 2 (two) times daily.     Insulin Pen Needle (TRUEPLUS PEN NEEDLES) 32G X 4 MM MISC Use as instructed. Inject into the skin twice daily 200 each 6   mirtazapine (REMERON) 30 MG tablet Take 1 tablet (30 mg total) by mouth at bedtime. 90 tablet 1   Multiple Vitamins-Minerals (THEREMS-M) TABS Take 1 tablet by mouth in the morning.     omeprazole (PRILOSEC) 40 MG capsule TAKE 1 CAPSULE (40 MG TOTAL) BY MOUTH DAILY. (Patient taking differently: Take 40 mg by mouth in the morning.) 30 capsule 0   TRUEplus Lancets 28G MISC Use as instructed. Check blood glucose level by fingerstick 3-4 times per day.   E11.65 200 each 3   No current facility-administered medications for this visit.    REVIEW OF SYSTEMS:   Constitutional: ( - ) fevers, ( - )  chills , ( - ) night sweats Eyes: ( - ) blurriness of vision, ( - ) double vision, ( - ) watery eyes Ears, nose, mouth, throat, and face: ( - ) mucositis, ( - ) sore throat Respiratory: ( - ) cough, ( - ) dyspnea, ( - ) wheezes Cardiovascular: ( - ) palpitation, ( - ) chest discomfort, ( - ) lower extremity swelling Gastrointestinal:  ( - ) nausea, ( - ) heartburn, ( - ) change in bowel habits Skin: ( - ) abnormal  skin rashes Lymphatics: ( - ) new lymphadenopathy, ( - ) easy bruising Neurological: ( - )  numbness, ( - ) tingling, ( - ) new weaknesses Behavioral/Psych: ( - ) mood change, ( - ) new changes  All other systems were reviewed with the patient and are negative.  PHYSICAL EXAMINATION: ECOG PERFORMANCE STATUS: 1 - Symptomatic but completely ambulatory  Vitals:   03/12/21 0914  BP: (!) 177/81  Pulse: 88  Resp: 20  Temp: 98.3 F (36.8 C)  SpO2: 100%   Filed Weights   03/12/21 0914  Weight: 153 lb 11.2 oz (69.7 kg)    GENERAL: well appearing middle-aged Caucasian male in NAD  SKIN: skin color, texture, turgor are normal, no rashes or significant lesions EYES: conjunctiva are pink and non-injected, sclera clear LUNGS: clear to auscultation and percussion with normal breathing effort HEART: regular rate & rhythm and no murmurs and no lower extremity edema ABDOMEN: soft, non-tender, non-distended, normal bowel sounds Musculoskeletal: no cyanosis of digits and no clubbing  PSYCH: alert & oriented x 3, fluent speech NEURO: no focal motor/sensory deficits  LABORATORY DATA:  I have reviewed the data as listed CBC Latest Ref Rng & Units 03/12/2021 02/26/2021 12/27/2020  WBC 4.0 - 10.5 K/uL 11.9(H) 9.3 7.2  Hemoglobin 13.0 - 17.0 g/dL 12.2(L) 12.9(L) 14.2  Hematocrit 39.0 - 52.0 % 36.8(L) 36.6(L) 44.5  Platelets 150 - 400 K/uL 275 297.0 293    CMP Latest Ref Rng & Units 03/12/2021 02/26/2021 12/27/2020  Glucose 70 - 99 mg/dL 87 90 293(H)  BUN 6 - 20 mg/dL 6 5(L) 7  Creatinine 0.61 - 1.24 mg/dL 0.70 0.70 0.71  Sodium 135 - 145 mmol/L 139 131(L) 135  Potassium 3.5 - 5.1 mmol/L 4.1 4.1 4.7  Chloride 98 - 111 mmol/L 104 96 101  CO2 22 - 32 mmol/L 28 27 28   Calcium 8.9 - 10.3 mg/dL 9.4 9.3 9.7  Total Protein 6.5 - 8.1 g/dL 8.1 7.8 -  Total Bilirubin 0.3 - 1.2 mg/dL 6.9(HH) 25.4(H) -  Alkaline Phos 38 - 126 U/L 638(H) 683(H) -  AST 15 - 41 U/L 115(H) 164(H) -  ALT 0 - 44 U/L 44 70(H) -     RADIOGRAPHIC STUDIES: CT ABDOMEN PELVIS W WO CONTRAST  Result Date: 02/26/2021 CLINICAL DATA:  Cirrhosis, abdominal pain, weight loss, incontinence, diarrhea, evaluate for hepatic or pancreatic malignancy EXAM: CT ABDOMEN AND PELVIS WITHOUT AND WITH CONTRAST TECHNIQUE: Multidetector CT imaging of the abdomen and pelvis was performed following the standard protocol before and following the bolus administration of intravenous contrast. CONTRAST:  193mL OMNIPAQUE IOHEXOL 300 MG/ML  SOLN COMPARISON:  05/13/2020 FINDINGS: Lower chest: No acute abnormality. Hepatobiliary: No solid liver abnormality is seen. Distended gallbladder with intra and extrahepatic biliary ductal dilatation to the pancreatic head, with abrupt truncation in the pancreatic head (series 11, image 31). The common bile duct measures up to 1.8 cm in caliber. Pancreas: There is a hypodense mass of the central pancreatic head measuring 4.4 x 3.7 cm, with abrupt truncation of the pancreatic duct, which is dilated along its remaining length to the tail the pancreatic duct measures up to 5 mm in caliber. This mass closely abuts and partially encases less than 50% of the right aspect of the central superior mesenteric vein and confluence of the portal vein (series 7, image 36). The mass is clearly separated by a fat plane from the superior mesenteric artery, celiac axis, and common hepatic artery. Spleen: Normal in size without significant abnormality. Adrenals/Urinary Tract: Adrenal glands are unremarkable. Kidneys are normal, without renal calculi, solid lesion, or hydronephrosis. Bladder  is unremarkable. Stomach/Bowel: Stomach is within normal limits. Appendix appears normal. No evidence of bowel wall thickening, distention, or inflammatory changes. Sigmoid diverticula. Vascular/Lymphatic: Aortic atherosclerosis. Interval enlargement of portacaval lymph nodes measuring up to 2.7 x 1.0 cm (series 7, image 25). Reproductive: No mass or other  significant abnormality. Other: No abdominal wall hernia or abnormality. No abdominopelvic ascites. Musculoskeletal: No acute or significant osseous findings. IMPRESSION: 1. There is a new hypodense mass of the central pancreatic head measuring 4.4 x 3.7 cm, with abrupt truncation of both the common bile duct and pancreatic duct, consistent with pancreatic adenocarcinoma. 2. This mass closely abuts and partially encases less than 50% of the right aspect of the central superior mesenteric vein and confluence of the portal vein. The mass is clearly separated by a fat plane from the superior mesenteric artery, celiac axis, and common hepatic artery. 3. Interval enlargement of portacaval lymph nodes measuring up to 2.7 x 1.0 cm, nonspecific. Nodal metastatic disease not excluded. 4. No definite evidence of nodal or soft tissue metastatic disease in the abdomen or pelvis. These results will be called to the ordering clinician or representative by the Radiologist Assistant, and communication documented in the PACS or [employer]. Aortic Atherosclerosis (ICD10-I70.0). Electronically Signed   By: Eddie Candle M.D.   On: 02/26/2021 13:14   CT CHEST W CONTRAST  Result Date: 03/06/2021 CLINICAL DATA:  Pancreatic adenocarcinoma chest staging EXAM: CT CHEST WITH CONTRAST TECHNIQUE: Multidetector CT imaging of the chest was performed during intravenous contrast administration. CONTRAST:  90mL OMNIPAQUE IOHEXOL 300 MG/ML  SOLN COMPARISON:  CT abdomen pelvis, 02/26/2021 FINDINGS: Cardiovascular: Aortic atherosclerosis. Normal heart size. Left and right coronary artery calcifications. No pericardial effusion. Mediastinum/Nodes: No enlarged mediastinal, hilar, or axillary lymph nodes. Thyroid gland, trachea, and esophagus demonstrate no significant findings. Lungs/Pleura: Mild centrilobular and paraseptal emphysema. Background of very fine centrilobular pulmonary nodules, concentrated in the lung apices. Bandlike scarring of  the left upper lobe. No pleural effusion or pneumothorax. Upper Abdomen: No acute abnormality. Musculoskeletal: No chest wall mass or suspicious bone lesions identified. IMPRESSION: 1. No evidence of metastatic disease in the chest. 2. Background of very fine centrilobular pulmonary nodules, concentrated in the lung apices, consistent with smoking-related respiratory bronchiolitis. 3. Emphysema. 4. Coronary artery disease. Aortic Atherosclerosis (ICD10-I70.0) and Emphysema (ICD10-J43.9). Electronically Signed   By: Eddie Candle M.D.   On: 03/06/2021 14:31   DG ERCP  Result Date: 02/27/2021 CLINICAL DATA:  Pancreatic lesion. EXAM: ERCP TECHNIQUE: Multiple spot images obtained with the fluoroscopic device and submitted for interpretation post-procedure. FLUOROSCOPY TIME:  Fluoroscopy Time:  1 minute, 50 seconds COMPARISON:  CT abdomen 02/26/2021 FINDINGS: Wire was advanced into the biliary system and retrograde cholangiogram was obtained. Contrast fills a dilated proximal common bile duct. There appears to be a severe distal common bile duct stricture. Minor filling of the intrahepatic bile ducts. Placement of a nonmetallic biliary stent. IMPRESSION: Distal common bile duct obstruction or stricture. Placement of a nonmetallic biliary stent. These images were submitted for radiologic interpretation only. Please see the procedural report for the amount of contrast and the fluoroscopy time utilized. Electronically Signed   By: Markus Daft M.D.   On: 02/27/2021 16:51    ASSESSMENT & PLAN [name] [name] 60 y.o. male with medical history significant for GERD, chronic hepatitis C, DM type II, and Barrett's esophagus who presents for evaluation of newly diagnosed pancreatic cancer.   After review of the labs, review of the records, and discussion with  the patient the patients findings are most consistent with locoregional adenocarcinoma of the pancreas.  The patient does still have an elevation in bilirubin which  likely represents a decline from the time of the stent placement.  He will be evaluated by surgery later this week to determine whether or not this is a borderline resectable tumor.  In the event that it is borderline we will plan to start the patient on neoadjuvant chemotherapy.  The 2 options would be gem Abraxane or FOLFIRINOX.  Given his excellent functional status I do believe he would be a good candidate for FOLFIRINOX.  We will plan to have the patient scheduled for chemotherapy education and start of treatment within 2 weeks time.  # Adenocarcinoma of the Pancreas, Stage IIB -- At this time disease appears at least borderline resectable.  We will likely need to proceed with neoadjuvant chemotherapy.  At this time would prefer a regimen of FOLFIRINOX given his good baseline health. -- case reviewed by surgery, who notes his disease is borderline resectable.  --Today we will order baseline labs to include CA 19-9, LDH, CEA --Staging scans complete.  No need for further imaging at this time. --We will plan to have a port placed --Return to clinic in approximately 2 weeks time in order to start therapy.  # Pain Control -- Patient currently taking ibuprofen for his abdominal pain --Currently rates his pain as a 7-10 in severity --We will prescribe oxycodone 5 to 10 mg every 6 hours as needed  #Supportive Care -- chemotherapy education to be scheduled  -- port placement to be scheduled.  -- zofran 8mg  q8H PRN and compazine 10mg  PO q6H for nausea --trial of Creon called into pharmacy to help with loose stools. -- EMLA cream for port   Orders Placed This Encounter  Procedures   IR IMAGING GUIDED PORT INSERTION    Standing Status:   Future    Standing Expiration Date:   03/12/2022    Order Specific Question:   Reason for Exam (SYMPTOM  OR DIAGNOSIS REQUIRED)    Answer:   pancreatic cancer, chemotherapy    Order Specific Question:   Preferred Imaging Location?    Answer:   [facility]   [place] 19.9    Standing Status:   Future    Number of Occurrences:   1    Standing Expiration Date:   03/12/2022   CBC with Differential (Cancer Center Only)    Standing Status:   Future    Number of Occurrences:   1    Standing Expiration Date:   03/12/2022   CMP ([place] only)    Standing Status:   Future    Number of Occurrences:   1    Standing Expiration Date:   03/12/2022   CEA (IN HOUSE-[facility])FOR [facility] ONLY    Standing Status:   Future    Number of Occurrences:   1    Standing Expiration Date:   03/12/2022   HCV RNA quant rflx ultra or genotyp    Standing Status:   Future    Number of Occurrences:   1    Standing Expiration Date:   03/12/2022   Protime-INR    Standing Status:   Future    Number of Occurrences:   1    Standing Expiration Date:   03/12/2022   Lactate dehydrogenase (LDH)    Standing Status:   Future    Number of Occurrences:   1  Standing Expiration Date:   03/12/2022   Glucose, capillary   Ambulatory Referral to [facility] Nutrition    Referral Priority:   Urgent    Referral Type:   Consultation    Referral Reason:   Specialty Services Required    Number of Visits Requested:   1   Ambulatory referral to Social Work    Referral Priority:   Urgent    Referral Type:   Consultation    Referral Reason:   Specialty Services Required    Number of Visits Requested:   1    All questions were answered. The patient knows to call the clinic with any problems, questions or concerns.  A total of more than 60 minutes were spent on this encounter with face-to-face time and non-face-to-face time, including preparing to see the patient, ordering tests and/or medications, counseling the patient and coordination of care as outlined above.   Ledell Peoples, MD Department of Hematology/Oncology [place] [facility] Phone: [phone] Pager: [phone] Email: [email or web address]  03/17/2021 6:45 PM

## 2021-03-12 ENCOUNTER — Encounter: Payer: Self-pay | Admitting: Hematology and Oncology

## 2021-03-12 ENCOUNTER — Telehealth: Payer: Self-pay

## 2021-03-12 ENCOUNTER — Other Ambulatory Visit: Payer: Self-pay

## 2021-03-12 ENCOUNTER — Inpatient Hospital Stay: Payer: Medicaid Other | Attending: Hematology and Oncology | Admitting: Hematology and Oncology

## 2021-03-12 ENCOUNTER — Other Ambulatory Visit (HOSPITAL_COMMUNITY): Payer: Self-pay

## 2021-03-12 ENCOUNTER — Inpatient Hospital Stay: Payer: Medicaid Other

## 2021-03-12 VITALS — BP 177/81 | HR 88 | Temp 98.3°F | Resp 20 | Ht 71.0 in | Wt 153.7 lb

## 2021-03-12 DIAGNOSIS — Z7982 Long term (current) use of aspirin: Secondary | ICD-10-CM | POA: Insufficient documentation

## 2021-03-12 DIAGNOSIS — K8689 Other specified diseases of pancreas: Secondary | ICD-10-CM

## 2021-03-12 DIAGNOSIS — F17210 Nicotine dependence, cigarettes, uncomplicated: Secondary | ICD-10-CM | POA: Diagnosis not present

## 2021-03-12 DIAGNOSIS — C259 Malignant neoplasm of pancreas, unspecified: Secondary | ICD-10-CM | POA: Insufficient documentation

## 2021-03-12 DIAGNOSIS — Z79899 Other long term (current) drug therapy: Secondary | ICD-10-CM | POA: Insufficient documentation

## 2021-03-12 DIAGNOSIS — E119 Type 2 diabetes mellitus without complications: Secondary | ICD-10-CM | POA: Insufficient documentation

## 2021-03-12 DIAGNOSIS — C250 Malignant neoplasm of head of pancreas: Secondary | ICD-10-CM | POA: Diagnosis present

## 2021-03-12 DIAGNOSIS — Z794 Long term (current) use of insulin: Secondary | ICD-10-CM | POA: Diagnosis not present

## 2021-03-12 LAB — CBC WITH DIFFERENTIAL (CANCER CENTER ONLY)
Abs Immature Granulocytes: 0.05 10*3/uL (ref 0.00–0.07)
Basophils Absolute: 0.1 10*3/uL (ref 0.0–0.1)
Basophils Relative: 1 %
Eosinophils Absolute: 0.1 10*3/uL (ref 0.0–0.5)
Eosinophils Relative: 1 %
HCT: 36.8 % — ABNORMAL LOW (ref 39.0–52.0)
Hemoglobin: 12.2 g/dL — ABNORMAL LOW (ref 13.0–17.0)
Immature Granulocytes: 0 %
Lymphocytes Relative: 10 %
Lymphs Abs: 1.2 10*3/uL (ref 0.7–4.0)
MCH: 32.2 pg (ref 26.0–34.0)
MCHC: 33.2 g/dL (ref 30.0–36.0)
MCV: 97.1 fL (ref 80.0–100.0)
Monocytes Absolute: 0.9 10*3/uL (ref 0.1–1.0)
Monocytes Relative: 8 %
Neutro Abs: 9.6 10*3/uL — ABNORMAL HIGH (ref 1.7–7.7)
Neutrophils Relative %: 80 %
Platelet Count: 275 10*3/uL (ref 150–400)
RBC: 3.79 MIL/uL — ABNORMAL LOW (ref 4.22–5.81)
RDW: 17.1 % — ABNORMAL HIGH (ref 11.5–15.5)
WBC Count: 11.9 10*3/uL — ABNORMAL HIGH (ref 4.0–10.5)
nRBC: 0 % (ref 0.0–0.2)

## 2021-03-12 LAB — CMP (CANCER CENTER ONLY)
ALT: 44 U/L (ref 0–44)
AST: 115 U/L — ABNORMAL HIGH (ref 15–41)
Albumin: 2.6 g/dL — ABNORMAL LOW (ref 3.5–5.0)
Alkaline Phosphatase: 638 U/L — ABNORMAL HIGH (ref 38–126)
Anion gap: 7 (ref 5–15)
BUN: 6 mg/dL (ref 6–20)
CO2: 28 mmol/L (ref 22–32)
Calcium: 9.4 mg/dL (ref 8.9–10.3)
Chloride: 104 mmol/L (ref 98–111)
Creatinine: 0.7 mg/dL (ref 0.61–1.24)
GFR, Estimated: >60 mL/min (ref >60)
Glucose, Bld: 87 mg/dL (ref 70–99)
Potassium: 4.1 mmol/L (ref 3.5–5.1)
Sodium: 139 mmol/L (ref 135–145)
Total Bilirubin: 6.9 mg/dL (ref 0.3–1.2)
Total Protein: 8.1 g/dL (ref 6.5–8.1)

## 2021-03-12 LAB — CEA (IN HOUSE-CHCC): CEA (CHCC-In House): 11.24 ng/mL — ABNORMAL HIGH (ref 0.00–5.00)

## 2021-03-12 LAB — LACTATE DEHYDROGENASE: LDH: 225 U/L — ABNORMAL HIGH (ref 98–192)

## 2021-03-12 LAB — PROTIME-INR
INR: 1 (ref 0.8–1.2)
Prothrombin Time: 13.4 seconds (ref 11.4–15.2)

## 2021-03-12 LAB — GLUCOSE, CAPILLARY: Glucose-Capillary: 108 mg/dL — ABNORMAL HIGH (ref 70–99)

## 2021-03-12 MED ORDER — PANCRELIPASE (LIP-PROT-AMYL) 36000-114000 UNITS PO CPEP
ORAL_CAPSULE | ORAL | 0 refills | Status: DC
Start: 2021-03-12 — End: 2021-03-21
  Filled 2021-03-12: qty 56, 8d supply, fill #0
  Filled 2021-03-12: qty 56, 7d supply, fill #0

## 2021-03-12 MED ORDER — LIDOCAINE-PRILOCAINE 2.5-2.5 % EX CREA
1.0000 | TOPICAL_CREAM | CUTANEOUS | 0 refills | Status: DC | PRN
Start: 2021-03-12 — End: 2021-08-07
  Filled 2021-03-12: qty 30, 30d supply, fill #0

## 2021-03-12 MED ORDER — ONDANSETRON HCL 8 MG PO TABS
8.0000 mg | ORAL_TABLET | Freq: Three times a day (TID) | ORAL | 0 refills | Status: DC | PRN
  Filled 2021-03-12: qty 30, 10d supply, fill #0

## 2021-03-12 MED ORDER — OXYCODONE HCL 5 MG PO TABS
5.0000 mg | ORAL_TABLET | Freq: Four times a day (QID) | ORAL | 0 refills | Status: DC | PRN
  Filled 2021-03-12: qty 60, 7d supply, fill #0

## 2021-03-12 MED ORDER — PROCHLORPERAZINE MALEATE 10 MG PO TABS
10.0000 mg | ORAL_TABLET | Freq: Four times a day (QID) | ORAL | 0 refills | Status: DC | PRN
  Filled 2021-03-12: qty 30, 8d supply, fill #0

## 2021-03-12 NOTE — Telephone Encounter (Signed)
CRITICAL VALUE STICKER  CRITICAL VALUE: Total Bilirubin 6.9  RECEIVER (on-site recipient of call): Jasmeet Gehl P. LPN     DATE & TIME NOTIFIED: 03/12/21 11:51am  MESSENGER (representative from lab): Suanne Marker   MD NOTIFIED: Dr. Lorenso Courier  TIME OF NOTIFICATION:11:52am

## 2021-03-12 NOTE — Progress Notes (Signed)
START ON PATHWAY REGIMEN - Pancreatic Adenocarcinoma     A cycle is every 14 days:     Oxaliplatin      Leucovorin      Irinotecan      Fluorouracil   **Always confirm dose/schedule in your pharmacy ordering system**  Patient Characteristics: Preoperative (Clinical Staging), Borderline Resectable, PS = 0,1, BRCA1/2 and PALB2 Mutation Absent/Unknown Therapeutic Status: Preoperative (Clinical Staging) AJCC T Category: cT3 AJCC N Category: cN1 Resectability Status: Borderline Resectable AJCC M Category: cM0 AJCC 8 Stage Grouping: IIB ECOG Performance Status: 1 BRCA1/2 Mutation Status: Awaiting Test Results PALB2 Mutation Status: Awaiting Test Results Intent of Therapy: Curative Intent, Discussed with Patient 

## 2021-03-12 NOTE — Progress Notes (Signed)
I met with Mr [name] [name] and his mother today after his appt with Dr Lorenso Courier.  I explained my role as the GI Nurse Navigator and provided my contact information.  I let him know that Dr Lorenso Courier ws prescribing Creon for his oily diarrheal stools.  I explained that because of his pancreatic cancer his body is not able to produce enough pancreatic enzymes to aid in digestion. I let him know that Creon is a pancreatic enzyme replacement.   As he is uninsured I provided a coupon for 56 free tablets to present to [facility] when he picks up his prescription.  I have also printed a [employer] patient assistance application that I will help him complete and send in.  I have placed referral to our social workers and dieticians for assistance.  I told him that we have transportation services at [facility] and social services will help arrange that for him. I also discussed the need for a port-a-cath  I explained briefly how it is inserted and how it is used in the cancer center. I showed them the demonstration port.  He verbalized understanding.  I let him know that Interventional radiology will call him to schedule port placement.  I told him our [place] will reach out regarding, chemo education class, chemotherapy, nutrition and social services appts.  All question where answered.  He and his mother verbalized understanding.

## 2021-03-13 ENCOUNTER — Telehealth: Payer: Self-pay | Admitting: Hematology and Oncology

## 2021-03-13 ENCOUNTER — Encounter: Payer: Self-pay | Admitting: General Practice

## 2021-03-13 LAB — HCV RNA QUANT RFLX ULTRA OR GENOTYP
HCV RNA Qnt(log copy/mL): UNDETERMINED log10 IU/mL
HepC Qn: NOT DETECTED IU/mL

## 2021-03-13 LAB — CANCER ANTIGEN 19-9: CA 19-9: 696 U/mL — ABNORMAL HIGH (ref 0–35)

## 2021-03-13 NOTE — Telephone Encounter (Signed)
Scheduled appointment per 07/14 sch msg. Patient is aware. 

## 2021-03-13 NOTE — Progress Notes (Signed)
[place] Work  Initial Assessment   [name] [name] is a 60 y.o. year old male contacted by phone. Clinical Social Work was referred by medical oncologist for assessment of psychosocial needs.   SDOH (Social Determinants of Health) assessments performed: Yes   Distress Screen completed: No No flowsheet data found.    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  Rents part of a house - has room and bathroom.  Prior to that he lived in a boarding house next door.   Family members/support persons in your life? Family and keeps in regular contact with [employer], gets food at [employer].  Has some supportive friends.   Transportation concerns: yes, referred to [employer], mother helps his w transportation - she lives in independent living at [facility]   He does not have a drivers license at this time.  Employment: Unemployed. Income source: Supported by [facility] Financial concerns: Yes, due to illness and/or loss of work during treatment  has been unable to work since he lost part of his foot (complications of double pneumonia).  Further injured himself and got severe bacterial infection.  Currently "wall walks", has a prosthesis in his shoe.  Used to work as an Programmer, systems.   Type of concern: Rent/ mortgage, Phone, Transportation, Medical bills, Food, and "financially I am strapped."  He believes he has applied for Medicaid [employer] at [employer]), first application was denied.   Food access concerns: yes, gets $79/month in [employer]; has appointment to speak w dietitian next week; will request food bag from [facility] pantry.  Has dorm fridge, outdoor gas grill, hot plate, slow cooker, microwave at his current lodgings.   Religious or spiritual practice: yes, 12 step recovery, is [place] Medication Concerns: yes, is  uninsured but can get medications from [facility].  Can also apply for [employer] so he can use [place] Currently in place:  attends 12 step meetings, uses [employer] to receive his mail, visits there regularly  Coping/ Adjustment to diagnosis: Patient understands treatment plan and what happens next? yes, Recently diagnosed with Stage 2 pancreatic cancer.  Treatment plan is to reduce tumor through chemotherapy, then if possible, have surgery.  Alternates between being scared and feeling better that he now has a plan and an explanation for his pain/symptoms.   Concerns about diagnosis and/or treatment: How I will pay for the services I need and financial distress due to inability to work Patient reported stressors: Housing, Insurance underwriter, Publishing rights manager, Transport planner, and [employer] and priorities: hopes to either return to work or obtain disability so he has financial stability. Patient enjoys time with family/ friends and cooking - likes to try new foods, is very resourceful Current coping skills/ strengths: Ability for insight, Capable of independent living, Armed forces logistics/support/administrative officer, Motivation for treatment/growth, and Supportive family/friends    SUMMARY: Current SDOH Barriers:  Financial constraints related to no income, Transportation, and Limited access to food  Clinical Social Work Clinical Goal(s):  patient will work with SW to address concerns related to financial stability and transportation  Interventions: Discussed common feeling and emotions when being diagnosed with cancer, and the importance of support during treatment Informed patient of the support team roles and support services at [facility] Provided [facility] contact information and encouraged patient to call with any questions or concerns Provided  patient with information about [facility] - sent referrals to both.  Advised he can ask for food from [employer].  Will enroll in [facility] when he is at [facility].  [employer] is to work w him on [employer] and [employer].  He was encouraged to contact DSS to appeal Medicaid denial prior to due date specified in denial letter.     Follow Up Plan: CSW will see patient on next [facility] visit, patient to call CSW to schedule when he has appointments scheduled.  Patient verbalizes understanding of plan: Yes    Beverely Pace , LCSW

## 2021-03-14 ENCOUNTER — Telehealth: Payer: Self-pay | Admitting: Nurse Practitioner

## 2021-03-14 ENCOUNTER — Encounter: Payer: Self-pay | Admitting: General Practice

## 2021-03-14 NOTE — Telephone Encounter (Signed)
   AENGUS SAUCEDA DOB: 01/26/1961 MRN: [id number]   RIDER WAIVER AND RELEASE OF LIABILITY  For purposes of improving physical access to our facilities, [facility] is pleased to partner with third parties to provide [place] patients or other authorized individuals the option of convenient, on-demand ground transportation services (the [place]") through use of the technology service that enables users to request on-demand ground transportation from independent third-party providers.  By opting to use and accept these [employer], I, the undersigned, hereby agree on behalf of myself, and on behalf of any minor child using the Government social research officer for whom I am the parent or legal guardian, as follows:  Government social research officer provided to me are provided by independent third-party transportation providers who are not [employer] or employees and who are unaffiliated with [employer]. [facility] is neither a transportation carrier nor a common or public carrier. [facility] has no control over the quality or safety of the transportation that occurs as a result of the [employer]. [facility] cannot guarantee that any third-party transportation provider will complete any arranged transportation service. [facility] makes no representation, warranty, or guarantee regarding the reliability, timeliness, quality, safety, suitability, or availability of any of the Transport Services or that they will be error free. I fully understand that traveling by vehicle involves risks and dangers of serious bodily injury, including permanent disability, paralysis, and death. I agree, on behalf of myself and on behalf of any minor child using the Transport Services for whom I am the parent or legal guardian, that the entire risk arising out of my use of the [employer] remains solely with me, to the maximum extent permitted under applicable law. The [employer] are provided "as  is" and "as available." [facility] disclaims all representations and warranties, express, implied or statutory, not expressly set out in these terms, including the implied warranties of merchantability and fitness for a particular purpose. I hereby waive and release [facility], its agents, employees, officers, directors, representatives, insurers, attorneys, assigns, successors, subsidiaries, and affiliates from any and all past, present, or future claims, demands, liabilities, actions, causes of action, or suits of any kind directly or indirectly arising from acceptance and use of the [employer]. I further waive and release [facility] and its affiliates from all present and future liability and responsibility for any injury or death to persons or damages to property caused by or related to the use of the [employer]. I have read this Waiver and Release of Liability, and I understand the terms used in it and their legal significance. This Waiver is freely and voluntarily given with the understanding that my right (as well as the right of any minor child for whom I am the parent or legal guardian using the [employer]) to legal recourse against [place] in connection with the [employer] is knowingly surrendered in return for use of these services.   I attest that I read the consent document to [name], gave Mr. Dunwoody the opportunity to ask questions and answered the questions asked (if any). I affirm that [name] then provided consent for he's participation in this program.     Katy Apo

## 2021-03-14 NOTE — Progress Notes (Signed)
[facility] CSW Progress Notes  Called pateint to let him know that per information from [employer], patient has been denied for Medicaid - he has 60 days to appeal this denial and submit new medical information.  He was encouraged to speak w DSS Medicaid worker to get directions on how to submit new updated medical information.  [employer] cannot assist him as DSS will not accept their Authorized Representative paperwork - patient initially applied on his own.  Patient also advised that he can apply for Social Security disability - CSW has already submitted referral to [facility] for assistance in applying for disability.  Edwyna Shell, LCSW Clinical Social Worker Phone:  [phone]

## 2021-03-17 ENCOUNTER — Inpatient Hospital Stay: Payer: Medicaid Other | Admitting: Nutrition

## 2021-03-17 ENCOUNTER — Other Ambulatory Visit: Payer: Self-pay

## 2021-03-17 ENCOUNTER — Encounter: Payer: Self-pay | Admitting: Hematology and Oncology

## 2021-03-17 ENCOUNTER — Telehealth: Payer: Self-pay | Admitting: Hematology and Oncology

## 2021-03-17 ENCOUNTER — Encounter: Payer: Self-pay | Admitting: *Deleted

## 2021-03-17 NOTE — Telephone Encounter (Signed)
Scheduled per los. Called and left msg. Mailed printout  °

## 2021-03-17 NOTE — Progress Notes (Signed)
Met with patient at registration to obtain CFA application and documentation. Advised I would submit via interoffice mail and they would contact him in 3-4 weeks with determination. Placed in outbox for interoffice mail to Business office: Manufacturing systems engineer.  Approved patient for one-time $1000 [place] grant to assist with personal expenses while going through treatment. Discussed in detail expenses and how they are covered. He has a copy of the approval letter and expense sheet along with the Outpatient pharmacy information as well as a copy of the Financial Assistance CFA application he submitted and supporting documents in a green folder. My card was enclosed as well for any additional financial questions or concerns.

## 2021-03-17 NOTE — Progress Notes (Signed)
[facility] Work  Holiday representative met with patient in the patient and family support center.  CSW enrolled patient in the [place] program and provided 1st disbersment of funds.  CSW provided contact information and encouraged patient to call with questions or concerns.    Johnnye Lana, MSW, LCSW, OSW-C Clinical Social Worker [facility] [phone]

## 2021-03-17 NOTE — Progress Notes (Signed)
I met with Mr Rabalais today.  He completed the Creon patient assistance application and provided the needed documentation.

## 2021-03-17 NOTE — Progress Notes (Signed)
60 year old male diagnosed with pancreas cancer.  He is followed by Dr. Lorenso Courier.  Receiving FOLFIRINOX.  Past medical history includes Barrett's esophagus, hepatitis C, depression, diabetes, GERD, and hiatal hernia.  Medications include Glucotrol, Lantus, Creon, multivitamin, Prilosec, Zofran, and Compazine.  Labs include albumin 2.6.  Height: 5 feet 11 inches. Weight: 153.7 pounds July 13.   Usual body weight: Approximately 163 pounds. BMI: 21.44.  Patient reports history of diarrhea and loose stools with oral intake.  Reports lower abdominal pain. States he was given Creon and has been taking 2 with meals and 1 with snacks.  Diarrhea still has not completely resolved.  Still reports some oily stools. Patient has done research and presented with some information about foods to consume for pancreas health. Patient is proactive with improving his diet and wants to improve his nutritional status and muscle mass.  Nutrition diagnosis: Unintended weight loss related to pancreas cancer as evidenced by approximate 10 pound weight loss from usual body weight. Patient noted for severe depletion of fat and muscle on physical exam.  Intervention: Educated patient on the importance of increasing calories and protein in small frequent meals and snacks. Reviewed importance of achieving adequate glycemic control but not overly restricting diet contributing to further weight loss. Educated on taking pancreatic enzymes. Reviewed importance of adding oral nutrition supplements and provided samples.  Also provided recipe fact sheets. Reviewed high-calorie, high-protein foods and provided fact sheets.  Monitoring, evaluation, goals: Patient will tolerate adequate calories and protein to minimize further weight loss.  Next visit: Thursday, July 28 during infusion.  **Disclaimer: This note was dictated with voice recognition software. Similar sounding words can inadvertently be transcribed and this note  may contain transcription errors which may not have been corrected upon publication of note.**

## 2021-03-19 ENCOUNTER — Encounter: Payer: Self-pay | Admitting: Hematology and Oncology

## 2021-03-20 ENCOUNTER — Other Ambulatory Visit: Payer: Self-pay | Admitting: Student

## 2021-03-20 NOTE — Progress Notes (Signed)
Pharmacist Chemotherapy Monitoring - Initial Assessment    Anticipated start date: 03/27/21   The following has been reviewed per standard work regarding the patient's treatment regimen: The patient's diagnosis, treatment plan and drug doses, and organ/hematologic function Lab orders and baseline tests specific to treatment regimen  The treatment plan start date, drug sequencing, and pre-medications Prior authorization status  Patient's documented medication list, including drug-drug interaction screen and prescriptions for anti-emetics and supportive care specific to the treatment regimen The drug concentrations, fluid compatibility, administration routes, and timing of the medications to be used The patient's access for treatment and lifetime cumulative dose history, if applicable  The patient's medication allergies and previous infusion related reactions, if applicable   Changes made to treatment plan:  treatment plan date  Follow up needed:  Will monitor LFT's for possible consideration of dose adjustments (Irinotecan/5FU)    Kennith Center, Pharm.D., CPP 03/20/2021@3 :02 PM

## 2021-03-21 ENCOUNTER — Other Ambulatory Visit (HOSPITAL_COMMUNITY): Payer: Self-pay

## 2021-03-21 ENCOUNTER — Encounter (HOSPITAL_COMMUNITY): Payer: Self-pay

## 2021-03-21 ENCOUNTER — Other Ambulatory Visit: Payer: Self-pay

## 2021-03-21 ENCOUNTER — Ambulatory Visit (HOSPITAL_COMMUNITY)
Admission: RE | Admit: 2021-03-21 | Discharge: 2021-03-21 | Disposition: A | Discharge status: Home / Self Care | Payer: Medicaid Other | Source: Ambulatory Visit | Attending: Hematology and Oncology | Admitting: Hematology and Oncology

## 2021-03-21 ENCOUNTER — Encounter: Payer: Self-pay | Admitting: Hematology and Oncology

## 2021-03-21 ENCOUNTER — Other Ambulatory Visit: Payer: Self-pay | Admitting: Hematology and Oncology

## 2021-03-21 DIAGNOSIS — C250 Malignant neoplasm of head of pancreas: Secondary | ICD-10-CM

## 2021-03-21 DIAGNOSIS — Z800 Family history of malignant neoplasm of digestive organs: Secondary | ICD-10-CM | POA: Insufficient documentation

## 2021-03-21 DIAGNOSIS — Z833 Family history of diabetes mellitus: Secondary | ICD-10-CM | POA: Insufficient documentation

## 2021-03-21 DIAGNOSIS — Z79899 Other long term (current) drug therapy: Secondary | ICD-10-CM | POA: Diagnosis not present

## 2021-03-21 DIAGNOSIS — Z8719 Personal history of other diseases of the digestive system: Secondary | ICD-10-CM | POA: Diagnosis not present

## 2021-03-21 DIAGNOSIS — Z794 Long term (current) use of insulin: Secondary | ICD-10-CM | POA: Insufficient documentation

## 2021-03-21 DIAGNOSIS — F17210 Nicotine dependence, cigarettes, uncomplicated: Secondary | ICD-10-CM | POA: Insufficient documentation

## 2021-03-21 DIAGNOSIS — Z8619 Personal history of other infectious and parasitic diseases: Secondary | ICD-10-CM | POA: Insufficient documentation

## 2021-03-21 DIAGNOSIS — Z791 Long term (current) use of non-steroidal anti-inflammatories (NSAID): Secondary | ICD-10-CM | POA: Diagnosis not present

## 2021-03-21 DIAGNOSIS — Z7982 Long term (current) use of aspirin: Secondary | ICD-10-CM | POA: Insufficient documentation

## 2021-03-21 DIAGNOSIS — Z803 Family history of malignant neoplasm of breast: Secondary | ICD-10-CM | POA: Diagnosis not present

## 2021-03-21 DIAGNOSIS — Z89422 Acquired absence of other left toe(s): Secondary | ICD-10-CM | POA: Insufficient documentation

## 2021-03-21 DIAGNOSIS — E1140 Type 2 diabetes mellitus with diabetic neuropathy, unspecified: Secondary | ICD-10-CM | POA: Diagnosis not present

## 2021-03-21 HISTORY — PX: IR IMAGING GUIDED PORT INSERTION: IMG5740

## 2021-03-21 LAB — GLUCOSE, CAPILLARY
Glucose-Capillary: 68 mg/dL — ABNORMAL LOW (ref 70–99)
Glucose-Capillary: 111 mg/dL — ABNORMAL HIGH (ref 70–99)

## 2021-03-21 LAB — COMPREHENSIVE METABOLIC PANEL
ALT: 80 U/L — ABNORMAL HIGH (ref 0–44)
AST: 178 U/L — ABNORMAL HIGH (ref 15–41)
Albumin: 2.9 g/dL — ABNORMAL LOW (ref 3.5–5.0)
Alkaline Phosphatase: 597 U/L — ABNORMAL HIGH (ref 38–126)
Anion gap: 11 (ref 5–15)
BUN: 10 mg/dL (ref 6–20)
CO2: 23 mmol/L (ref 22–32)
Calcium: 9.1 mg/dL (ref 8.9–10.3)
Chloride: 101 mmol/L (ref 98–111)
Creatinine, Ser: <0.3 mg/dL — ABNORMAL LOW (ref 0.61–1.24)
Glucose, Bld: 180 mg/dL — ABNORMAL HIGH (ref 70–99)
Potassium: 3.6 mmol/L (ref 3.5–5.1)
Sodium: 135 mmol/L (ref 135–145)
Total Bilirubin: 10 mg/dL — ABNORMAL HIGH (ref 0.3–1.2)
Total Protein: 7.7 g/dL (ref 6.5–8.1)

## 2021-03-21 MED ORDER — SODIUM CHLORIDE 0.9 % IV SOLN: INTRAVENOUS | Status: DC

## 2021-03-21 MED ORDER — LIDOCAINE-EPINEPHRINE 1 %-1:100000 IJ SOLN
INTRAMUSCULAR | Status: AC
  Filled 2021-03-21: qty 1

## 2021-03-21 MED ORDER — DEXTROSE 50 % IV SOLN
INTRAVENOUS | Status: AC
  Administered 2021-03-21: 25 mL
  Filled 2021-03-21: qty 50

## 2021-03-21 MED ORDER — HEPARIN SOD (PORK) LOCK FLUSH 100 UNIT/ML IV SOLN
INTRAVENOUS | Status: AC
  Filled 2021-03-21: qty 5

## 2021-03-21 MED ORDER — DEXTROSE 50 % IV SOLN: 25.0000 mL | Freq: Once | INTRAVENOUS | Status: DC

## 2021-03-21 MED ORDER — FENTANYL CITRATE (PF) 100 MCG/2ML IJ SOLN
INTRAMUSCULAR | Status: AC | PRN
  Administered 2021-03-21 (×2): 50 ug via INTRAVENOUS

## 2021-03-21 MED ORDER — LIDOCAINE-EPINEPHRINE 1 %-1:100000 IJ SOLN
INTRAMUSCULAR | Status: AC | PRN
  Administered 2021-03-21 (×2): 10 mL via INTRADERMAL

## 2021-03-21 MED ORDER — PANCRELIPASE (LIP-PROT-AMYL) 36000-114000 UNITS PO CPEP
ORAL_CAPSULE | ORAL | 0 refills | Status: DC
  Filled 2021-03-21: qty 56, 8d supply, fill #0
  Filled 2021-03-24: qty 56, 7d supply, fill #0
  Filled 2021-03-28: qty 56, 8d supply, fill #0

## 2021-03-21 MED ORDER — FENTANYL CITRATE (PF) 100 MCG/2ML IJ SOLN
INTRAMUSCULAR | Status: AC
  Filled 2021-03-21: qty 2

## 2021-03-21 MED ORDER — HEPARIN SOD (PORK) LOCK FLUSH 100 UNIT/ML IV SOLN
INTRAVENOUS | Status: AC | PRN
  Administered 2021-03-21: 500 [IU] via INTRAVENOUS

## 2021-03-21 NOTE — H&P (Signed)
Chief Complaint: Patient was seen in consultation today for pancreatic cancer  Referring Physician(s): Dorsey,John T IV  Supervising Physician: Jacqulynn Cadet  Patient Status: [facility] - Out-pt  History of Present Illness: [name] [name] is a 60 y.o. 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  He is in need of durable venous access and has been referred to IR for Port-A-Cath placement at the request of Dr. Lorenso Courier.   Patient presents today in his usual state of health.  He did drink a few ounces of orange juice this AM for medications and low blood sugar around 7AM.  He reports abdominal pain, nausea, and frequent diarrhea at home.  He does take immodium and Creon.  Last took this AM.  Reports his urine has been darkening over the past 2 days. He is jaundiced today.  His most recent lab work at the [facility] center showed a bilirubin of  6.9 which was an improvement from 25.4 at the time of his biliary sent placement 6/30 with Dr. Ardis Hughs.   Past Medical History:  Diagnosis Date   Barrett's esophagus dx 2016   Bronchitis    Chronic hepatitis C without hepatic coma ([place]) 10/31/2014   Depression    Diabetes ([place])    ED (erectile dysfunction)    GERD (gastroesophageal reflux disease)    Hepatitis C    Hiatal hernia 10/2014   3cm   Neuropathy    S/P transmetatarsal amputation of foot, left ([place]) 11/28/2020    Past Surgical History:  Procedure Laterality Date   AMPUTATION Left 11/15/2020   Procedure: LEFT TRANSMETATARSALS AMPUTATION;  Surgeon: Newt Minion, MD;  Location: [facility];  Service: Orthopedics;  Laterality: Left;   APPENDECTOMY     BILIARY STENT PLACEMENT N/A 02/27/2021   Procedure: BILIARY STENT PLACEMENT;  Surgeon: Milus Banister, MD;  Location: [facility];  Service: Endoscopy;  Laterality: N/A;   COLONOSCOPY N/A 02/16/2014    Procedure: COLONOSCOPY;  Surgeon: Gatha Mayer, MD;  Location: [facility];  Service: Endoscopy;  Laterality: N/A;   COLONOSCOPY     ENDOSCOPIC RETROGRADE CHOLANGIOPANCREATOGRAPHY (ERCP) WITH PROPOFOL N/A 02/27/2021   Procedure: ENDOSCOPIC RETROGRADE CHOLANGIOPANCREATOGRAPHY (ERCP) WITH PROPOFOL;  Surgeon: Milus Banister, MD;  Location: [facility];  Service: Endoscopy;  Laterality: N/A;   EUS N/A 02/27/2021   Procedure: UPPER ENDOSCOPIC ULTRASOUND (EUS) RADIAL;  Surgeon: Milus Banister, MD;  Location: [facility];  Service: Endoscopy;  Laterality: N/A;   FINE NEEDLE ASPIRATION N/A 02/27/2021   Procedure: FINE NEEDLE ASPIRATION (FNA) LINEAR;  Surgeon: Milus Banister, MD;  Location: [facility];  Service: Endoscopy;  Laterality: N/A;   SPHINCTEROTOMY  02/27/2021   Procedure: SPHINCTEROTOMY;  Surgeon: Milus Banister, MD;  Location: [facility];  Service: Endoscopy;;   TONSILLECTOMY      Allergies: Bee venom and Lactose intolerance (gi)  Medications: Prior to Admission medications   Medication Sig Start Date End Date Taking? Authorizing Provider  albuterol (VENTOLIN HFA) 108 (90 Base) MCG/ACT inhaler Inhale 2 puffs into the lungs every 6 (six) hours as needed for wheezing or shortness of breath. 12/05/20  Yes Gerlene Fee, NP  aspirin EC 81 MG tablet Take 1 tablet (81 mg total) by mouth daily. 04/02/17  Yes Funches, Josalyn, MD  atorvastatin (LIPITOR) 40 MG tablet TAKE 1 TABLET (40 MG TOTAL) BY MOUTH DAILY. Patient taking differently: Take 40 mg  by mouth in the morning. 12/05/20  Yes Gerlene Fee, NP  DULoxetine (CYMBALTA) 60 MG capsule TAKE 1 CAPSULE (60 MG TOTAL) BY MOUTH DAILY. Patient taking differently: Take 60 mg by mouth in the morning. 09/16/20 09/16/21 Yes [name], NP  folic acid (FOLVITE) 1 MG tablet Take 1 tablet (1 mg total) by mouth daily. Patient taking differently: Take 1 mg by mouth in the morning. 12/05/20  Yes Gerlene Fee, NP  glipiZIDE (GLUCOTROL)  5 MG tablet Take 5 mg by mouth daily before breakfast. Please fill as a 90 day supply 02/26/21 02/26/22 Yes [provider]  ibuprofen (ADVIL) 800 MG tablet Take 800 mg by mouth every 8 (eight) hours as needed.   Yes [provider]  insulin glargine (LANTUS) 100 UNIT/ML injection Inject 30 Units into the skin 2 (two) times daily.   Yes [provider]  losartan (COZAAR) 50 MG tablet TAKE 1 TABLET (50 MG TOTAL) BY MOUTH DAILY. PLEASE FILL AS A 90 DAY SUPPLY 03/05/21 03/05/22 Yes Newlin, Enobong, MD  meloxicam (MOBIC) 7.5 MG tablet TAKE 1 TABLET (7.5 MG TOTAL) BY MOUTH DAILY. 03/05/21  Yes Charlott Rakes, MD  mirtazapine (REMERON) 30 MG tablet Take 1 tablet (30 mg total) by mouth at bedtime. 01/07/21 04/07/21 Yes [name], NP  Multiple Vitamins-Minerals (THEREMS-M) TABS Take 1 tablet by mouth in the morning.   Yes [provider]  omeprazole (PRILOSEC) 40 MG capsule TAKE 1 CAPSULE (40 MG TOTAL) BY MOUTH DAILY. Patient taking differently: Take 40 mg by mouth in the morning. 12/05/20 12/05/21 Yes Gerlene Fee, NP  oxyCODONE (OXY IR/ROXICODONE) 5 MG immediate release tablet Take 1-2 tablets (5-10 mg total) by mouth every 6 (six) hours as needed for severe pain. 03/12/21  Yes Orson Slick, MD  acetaminophen (TYLENOL) 325 MG tablet Take 2 tablets (650 mg total) by mouth every 6 (six) hours as needed for mild pain (or Fever >/= 101). Patient not taking: Reported on 03/12/2021 11/26/20   Cherene Altes, MD  glucose blood (TRUE METRIX BLOOD GLUCOSE TEST) test strip Use as instructed. Check blood glucose level by fingerstick 3-4 times per day.  E11.65 03/04/21   [name], NP  Insulin Pen Needle (TRUEPLUS PEN NEEDLES) 32G X 4 MM MISC Use as instructed. Inject into the skin twice daily 03/04/21   [name], NP  lidocaine-prilocaine (EMLA) cream Apply 1 application topically as directed as needed. 03/12/21   Orson Slick, MD  lipase/protease/amylase (CREON)  36000 UNITS CPEP capsule Take 2 capsules (72,000 Units total) by mouth 3 (three) times daily with meals. May also take 1 capsule (36,000 Units total) as needed (with snacks). 03/12/21   Orson Slick, MD  ondansetron (ZOFRAN) 8 MG tablet Take 1 tablet (8 mg total) by mouth every 8 (eight) hours as needed for nausea or vomiting. 03/12/21   Orson Slick, MD  prochlorperazine (COMPAZINE) 10 MG tablet Take 1 tablet (10 mg total) by mouth every 6 (six) hours as needed for nausea or vomiting. 03/12/21   Orson Slick, MD  TRUEplus Lancets 28G MISC Use as instructed. Check blood glucose level by fingerstick 3-4 times per day.   E11.65 03/04/21   [name], NP     Family History  Problem Relation Age of Onset   Breast cancer Mother    Stroke Father    Alcohol abuse Father    Heart disease Maternal Grandfather  Pancreatic cancer Paternal Grandmother    Diabetes Paternal Grandfather    Colon cancer Neg Hx    Stomach cancer Neg Hx    Rectal cancer Neg Hx    Esophageal cancer Neg Hx     Social History   Socioeconomic History   Marital status: Single    Spouse name: Not on file   Number of children: Not on file   Years of education: Not on file   Highest education level: Not on file  Occupational History   Not on file  Tobacco Use   Smoking status: Every Day    Packs/day: 0.50    Years: 35.00    Pack years: 17.50    Types: Cigarettes   Smokeless tobacco: Never  Vaping Use   Vaping Use: Never used  Substance and Sexual Activity   Alcohol use: Not Currently    Comment: previous   Drug use: No   Sexual activity: Not on file  Other Topics Concern   Not on file  Social History Narrative   Not on file   Social Determinants of Health   Financial Resource Strain: High Risk   Difficulty of Paying Living Expenses: Very hard  Food Insecurity: Not on file  Transportation Needs: Unmet Transportation Needs   Lack of Transportation (Medical): Yes   Lack of Transportation  (Non-Medical): Yes  Physical Activity: Not on file  Stress: Not on file  Social Connections: Not on file     Review of Systems: A 12 point ROS discussed and pertinent positives are indicated in the HPI above.  All other systems are negative.  Review of Systems  Constitutional:  Negative for fatigue and fever.  Respiratory:  Negative for cough and shortness of breath.   Cardiovascular:  Negative for chest pain.  Gastrointestinal:  Positive for abdominal pain, diarrhea and nausea. Negative for vomiting.  Musculoskeletal:  Negative for back pain.  Skin:  Positive for color change (jaundice).  Psychiatric/Behavioral:  Negative for behavioral problems and confusion.    Vital Signs: BP 136/77   Pulse 81   Temp 98.6 F (37 C) (Oral)   Resp 20   Ht '5\' 11"'$  (1.803 m)   Wt 146 lb (66.2 kg)   SpO2 100%   BMI 20.36 kg/m   Physical Exam Vitals and nursing note reviewed.  Constitutional:      General: He is not in acute distress.    Appearance: Normal appearance. He is normal weight.  HENT:     Mouth/Throat:     Mouth: Mucous membranes are moist.     Pharynx: Oropharynx is clear.  Eyes:     General: Scleral icterus present.  Cardiovascular:     Rate and Rhythm: Normal rate and regular rhythm.  Pulmonary:     Effort: Pulmonary effort is normal. No respiratory distress.     Breath sounds: Normal breath sounds.  Abdominal:     General: Abdomen is flat.     Palpations: Abdomen is soft.  Skin:    General: Skin is warm and dry.     Coloration: Skin is jaundiced.  Neurological:     General: No focal deficit present.     Mental Status: He is alert and oriented to person, place, and time. Mental status is at baseline.  Psychiatric:        Mood and Affect: Mood normal.        Behavior: Behavior normal.        Thought Content: Thought content normal.  Judgment: Judgment normal.     MD Evaluation Airway: WNL Heart: WNL Abdomen: WNL Chest/ Lungs: WNL ASA  Classification:  3 Mallampati/Airway Score: Two   Imaging: CT ABDOMEN PELVIS W WO CONTRAST  Result Date: 02/26/2021 CLINICAL DATA:  Cirrhosis, abdominal pain, weight loss, incontinence, diarrhea, evaluate for hepatic or pancreatic malignancy EXAM: CT ABDOMEN AND PELVIS WITHOUT AND WITH CONTRAST TECHNIQUE: Multidetector CT imaging of the abdomen and pelvis was performed following the standard protocol before and following the bolus administration of intravenous contrast. CONTRAST:  148m OMNIPAQUE IOHEXOL 300 MG/ML  SOLN COMPARISON:  05/13/2020 FINDINGS: Lower chest: No acute abnormality. Hepatobiliary: No solid liver abnormality is seen. Distended gallbladder with intra and extrahepatic biliary ductal dilatation to the pancreatic head, with abrupt truncation in the pancreatic head (series 11, image 31). The common bile duct measures up to 1.8 cm in caliber. Pancreas: There is a hypodense mass of the central pancreatic head measuring 4.4 x 3.7 cm, with abrupt truncation of the pancreatic duct, which is dilated along its remaining length to the tail the pancreatic duct measures up to 5 mm in caliber. This mass closely abuts and partially encases less than 50% of the right aspect of the central superior mesenteric vein and confluence of the portal vein (series 7, image 36). The mass is clearly separated by a fat plane from the superior mesenteric artery, celiac axis, and common hepatic artery. Spleen: Normal in size without significant abnormality. Adrenals/Urinary Tract: Adrenal glands are unremarkable. Kidneys are normal, without renal calculi, solid lesion, or hydronephrosis. Bladder is unremarkable. Stomach/Bowel: Stomach is within normal limits. Appendix appears normal. No evidence of bowel wall thickening, distention, or inflammatory changes. Sigmoid diverticula. Vascular/Lymphatic: Aortic atherosclerosis. Interval enlargement of portacaval lymph nodes measuring up to 2.7 x 1.0 cm (series 7, image 25). Reproductive: No  mass or other significant abnormality. Other: No abdominal wall hernia or abnormality. No abdominopelvic ascites. Musculoskeletal: No acute or significant osseous findings. IMPRESSION: 1. There is a new hypodense mass of the central pancreatic head measuring 4.4 x 3.7 cm, with abrupt truncation of both the common bile duct and pancreatic duct, consistent with pancreatic adenocarcinoma. 2. This mass closely abuts and partially encases less than 50% of the right aspect of the central superior mesenteric vein and confluence of the portal vein. The mass is clearly separated by a fat plane from the superior mesenteric artery, celiac axis, and common hepatic artery. 3. Interval enlargement of portacaval lymph nodes measuring up to 2.7 x 1.0 cm, nonspecific. Nodal metastatic disease not excluded. 4. No definite evidence of nodal or soft tissue metastatic disease in the abdomen or pelvis. These results will be called to the ordering clinician or representative by the Radiologist Assistant, and communication documented in the PACS or [employer] Aortic Atherosclerosis (ICD10-I70.0). Electronically Signed   By: AEddie CandleM.D.   On: 02/26/2021 13:14   CT CHEST W CONTRAST  Result Date: 03/06/2021 CLINICAL DATA:  Pancreatic adenocarcinoma chest staging EXAM: CT CHEST WITH CONTRAST TECHNIQUE: Multidetector CT imaging of the chest was performed during intravenous contrast administration. CONTRAST:  618mOMNIPAQUE IOHEXOL 300 MG/ML  SOLN COMPARISON:  CT abdomen pelvis, 02/26/2021 FINDINGS: Cardiovascular: Aortic atherosclerosis. Normal heart size. Left and right coronary artery calcifications. No pericardial effusion. Mediastinum/Nodes: No enlarged mediastinal, hilar, or axillary lymph nodes. Thyroid gland, trachea, and esophagus demonstrate no significant findings. Lungs/Pleura: Mild centrilobular and paraseptal emphysema. Background of very fine centrilobular pulmonary nodules, concentrated in the lung apices.  Bandlike scarring of the left upper  lobe. No pleural effusion or pneumothorax. Upper Abdomen: No acute abnormality. Musculoskeletal: No chest wall mass or suspicious bone lesions identified. IMPRESSION: 1. No evidence of metastatic disease in the chest. 2. Background of very fine centrilobular pulmonary nodules, concentrated in the lung apices, consistent with smoking-related respiratory bronchiolitis. 3. Emphysema. 4. Coronary artery disease. Aortic Atherosclerosis (ICD10-I70.0) and Emphysema (ICD10-J43.9). Electronically Signed   By: Eddie Candle M.D.   On: 03/06/2021 14:31   DG ERCP  Result Date: 02/27/2021 CLINICAL DATA:  Pancreatic lesion. EXAM: ERCP TECHNIQUE: Multiple spot images obtained with the fluoroscopic device and submitted for interpretation post-procedure. FLUOROSCOPY TIME:  Fluoroscopy Time:  1 minute, 50 seconds COMPARISON:  CT abdomen 02/26/2021 FINDINGS: Wire was advanced into the biliary system and retrograde cholangiogram was obtained. Contrast fills a dilated proximal common bile duct. There appears to be a severe distal common bile duct stricture. Minor filling of the intrahepatic bile ducts. Placement of a nonmetallic biliary stent. IMPRESSION: Distal common bile duct obstruction or stricture. Placement of a nonmetallic biliary stent. These images were submitted for radiologic interpretation only. Please see the procedural report for the amount of contrast and the fluoroscopy time utilized. Electronically Signed   By: Markus Daft M.D.   On: 02/27/2021 16:51    Labs:  CBC: Recent Labs    11/29/20 0815 12/27/20 0918 02/26/21 1100 03/12/21 1054  WBC 8.6 7.2 9.3 11.9*  HGB 13.8 14.2 12.9* 12.2*  HCT 42.5 44.5 36.6* 36.8*  PLT 288 293 297.0 275    COAGS: Recent Labs    05/12/20 2120 05/13/20 0512 10/28/20 1014 11/15/20 0416 02/26/21 1100 03/12/21 1054  INR 1.3*   < > 1.3* 1.0 1.3* 1.0  APTT 43*  --   --   --   --   --    < > = values in this interval not displayed.     BMP: Recent Labs    05/22/20 1611 05/22/20 1611 06/20/20 0925 09/23/20 1037 10/28/20 1014 11/29/20 0815 12/02/20 0921 12/27/20 0918 02/26/21 1100 03/12/21 1054  NA 134*   < > 135 129*   < > 131* 134* 135 131* 139  K 3.6  --  3.8 4.7   < > 3.9 3.9 4.7 4.1 4.1  CL 102  --  98 94*   < > 99 102 101 96 104  CO2 22  --  21 22   < > '24 24 28 27 28  '$ GLUCOSE 283*   < > 301* 702*   < > 369* 213* 293* 90 87  BUN 18   < > 14 9   < > '8 7 7 '$ 5* 6  CALCIUM 9.1  --  9.5 9.0   < > 9.3 9.6 9.7 9.3 9.4  CREATININE 0.79  --  0.69* 0.76   < > 0.51* 0.56* 0.71 0.70 0.70  GFRNONAA >60  --  105 101   < > >60 >60 103  --  >60  GFRAA >60  --  121 116  --   --   --  120  --   --    < > = values in this interval not displayed.    LIVER FUNCTION TESTS: Recent Labs    11/14/20 0307 11/15/20 0416 02/26/21 1100 03/12/21 1054  BILITOT 0.5 0.8 25.4* 6.9*  AST 21 26 164* 115*  ALT 16 22 70* 44  ALKPHOS 66 71 683* 638*  PROT 6.6 7.3 7.8 8.1  ALBUMIN 2.8* 2.8* 3.4* 2.6*  TUMOR MARKERS: Recent Labs    02/26/21 1100  AFPTM 5.7    Assessment and Plan: Patient with past medical history of HTN, GERD, Barrett's esophagus presents with complaint of pancreatic cancer in need of chemotherapy initiation.  IR consulted for Port-A-Cath placement at the request of Dr. Lorenso Courier. Case reviewed by Dr. Laurence Ferrari who approves patient for procedure.  Patient presents today in their usual state of health, however he continues to experience nausea, abdominal pain, and reports progressively darkening urine color with ongoing tan diarrhea. Will plan to proceed with Port placement today and will check CMP for any acute change in his LFTs prior to discharge home. He has been NPO as of 7AM and is not currently on blood thinners.   Risks and benefits of image guided port-a-catheter placement was discussed with the patient including, but not limited to bleeding, infection, pneumothorax, or fibrin sheath development and  need for additional procedures.  All of the patient's questions were answered, patient is agreeable to proceed. Consent signed and in chart.  Thank you for this interesting consult.  I greatly enjoyed meeting ACEN MCVEIGH and look forward to participating in their care.  A copy of this report was sent to the requesting provider on this date.  Electronically Signed: Docia Barrier, PA 03/21/2021, 8:58 AM   I spent a total of  30 Minutes   in face to face in clinical consultation, greater than 50% of which was counseling/coordinating care for pancreatic cancer.

## 2021-03-21 NOTE — Progress Notes (Addendum)
Patient in for procedure- stated had OJ to drink this morning due to low blood sugar. Checked glucose -65 Informed PA- orders given  25 cc Dextrose given  Repeat Glucose at 0905 - 111  Patient also states has had diarrhea this am (PA aware)  and noticeable yellowish color in eyes.

## 2021-03-21 NOTE — Procedures (Signed)
Interventional Radiology Procedure Note  Procedure: Placement of a right IJ approach single lumen PowerPort.  Tip is positioned at the superior cavoatrial junction and catheter is ready for immediate use.   EBL: 0   Complications: No immediate  Recommendations:  - Ok to shower tomorrow - Do not submerge for 7 days - Routine line care      Signed,  Criselda Peaches, MD

## 2021-03-21 NOTE — Discharge Instructions (Signed)
For questions /concerns may call Interventional Radiology at [phone] ° °You may remove your dressing and shower tomorrow afternoon ° °DO NOT use EMLA cream for 2 weeks after port placement as the cream will remove surgical glue on your incision.    ° ° °Implanted Port Insertion, Care After °This sheet gives you information about how to care for yourself after your procedure. Your health care provider may also give you more specific instructions. If you have problems or questions, contact your health careprovider. °What can I expect after the procedure? °After the procedure, it is common to have: °Discomfort at the port insertion site. °Bruising on the skin over the port. This should improve over 3-4 days. °Follow these instructions at home: °Port care °After your port is placed, you will get a manufacturer's information card. The card has information about your port. Keep this card with you at all times. °Take care of the port as told by your health care provider. Ask your health care provider if you or a family member can get training for taking care of the port at home. A home health care nurse may also take care of the port. °Make sure to remember what type of port you have. °Incision care °Follow instructions from your health care provider about how to take care of your port insertion site. Make sure you: °Wash your hands with soap and water before and after you change your bandage (dressing). If soap and water are not available, use hand sanitizer. °Change your dressing as told by your health care provider. °Leave skin glue, or adhesive strips in place. These skin closures may need to stay in place for 2 weeks or longer.  °Check your port insertion site every day for signs of infection. Check for: °     - Redness, swelling, or pain. °                    - Fluid or blood. °     - Warmth. °     - Pus or a bad smell. °Activity °Return to your normal activities as told by your health care provider. Ask your  health care provider what activities are safe for you. °Do not lift anything that is heavier than 10 lb (4.5 kg), or the limit that you are told, until your health care provider says that it is safe. °General instructions °Take over-the-counter and prescription medicines only as told by your health care provider. °Do not take baths, swim, or use a hot tub until your health care provider approves. Ask your health care provider if you may take showers. You may only be allowed to take sponge baths. °Do not drive for 24 hours if you were given a sedative during your procedure. °Wear a medical alert bracelet in case of an emergency. This will tell any health care providers that you have a port. °Keep all follow-up visits as told by your health care provider. This is important. °Contact a health care provider if: °You cannot flush your port with saline as directed, or you cannot draw blood from the port. °You have a fever or chills. °You have redness, swelling, or pain around your port insertion site. °You have fluid or blood coming from your port insertion site. °Your port insertion site feels warm to the touch. °You have pus or a bad smell coming from the port insertion site. °Get help right away if: °You have chest pain or shortness of breath. °You have bleeding from   your port that you cannot control. °Summary °Take care of the port as told by your health care provider. Keep the manufacturer's information card with you at all times. °Change your dressing as told by your health care provider. °Contact a health care provider if you have a fever or chills or if you have redness, swelling, or pain around your port insertion site. °Keep all follow-up visits as told by your health care provider. °This information is not intended to replace advice given to you by your health care provider. Make sure you discuss any questions you have with your healthcare provider. ° °Moderate Conscious Sedation, Adult, Care After °This sheet  gives you information about how to care for yourself after your procedure. Your health care provider may also give you more specific instructions. If you have problems or questions, contact your health careprovider. °What can I expect after the procedure? °After the procedure, it is common to have: °Sleepiness for several hours. °Impaired judgment for several hours. °Difficulty with balance. °Vomiting if you eat too soon. °Follow these instructions at home: °For the time period you were told by your health care provider: °Rest. °Do not participate in activities where you could fall or become injured. °Do not drive or use machinery. °Do not drink alcohol. °Do not take sleeping pills or medicines that cause drowsiness. °Do not make important decisions or sign legal documents. °Do not take care of children on your own. °Eating and drinking ° °Follow the diet recommended by your health care provider. °Drink enough fluid to keep your urine pale yellow. °If you vomit: °Drink water, juice, or soup when you can drink without vomiting. °Make sure you have little or no nausea before eating solid foods. ° °General instructions °Take over-the-counter and prescription medicines only as told by your health care provider. °Have a responsible adult stay with you for the time you are told. It is important to have someone help care for you until you are awake and alert. °Do not smoke. °Keep all follow-up visits as told by your health care provider. This is important. °Contact a health care provider if: °You are still sleepy or having trouble with balance after 24 hours. °You feel light-headed. °You keep feeling nauseous or you keep vomiting. °You develop a rash. °You have a fever. °You have redness or swelling around the IV site. °Get help right away if: °You have trouble breathing. °You have new-onset confusion at home. °Summary °After the procedure, it is common to feel sleepy, have impaired judgment, or feel nauseous if you eat  too soon. °Rest after you get home. Know the things you should not do after the procedure. °Follow the diet recommended by your health care provider and drink enough fluid to keep your urine pale yellow. °Get help right away if you have trouble breathing or new-onset confusion at home. °This information is not intended to replace advice given to you by your health care provider. Make sure you discuss any questions you have with your healthcare provider. °Document Revised: 12/15/2019 Document Reviewed: 07/13/2019 °Elsevier Patient Education © 2022 [employer]  °

## 2021-03-21 NOTE — Progress Notes (Signed)
Patient CMP resulted with increased Tbili of 10.0, elevated LFTs compared to most recent values at [facility] 1 week ago.  Patient with symptom management at home.  Vital signs stable.  Notified Oncology office of results.  Proceed with discharge home today.   Brynda Greathouse, MS RD PA-C

## 2021-03-24 ENCOUNTER — Telehealth: Payer: Self-pay

## 2021-03-24 ENCOUNTER — Encounter: Payer: Self-pay | Admitting: Hematology and Oncology

## 2021-03-24 ENCOUNTER — Inpatient Hospital Stay: Payer: Medicaid Other

## 2021-03-24 ENCOUNTER — Other Ambulatory Visit (HOSPITAL_COMMUNITY): Payer: Self-pay

## 2021-03-24 ENCOUNTER — Other Ambulatory Visit: Payer: Self-pay

## 2021-03-24 ENCOUNTER — Other Ambulatory Visit (INDEPENDENT_AMBULATORY_CARE_PROVIDER_SITE_OTHER): Payer: Medicaid Other

## 2021-03-24 DIAGNOSIS — K7460 Unspecified cirrhosis of liver: Secondary | ICD-10-CM

## 2021-03-24 DIAGNOSIS — R7989 Other specified abnormal findings of blood chemistry: Secondary | ICD-10-CM

## 2021-03-24 LAB — HEPATIC FUNCTION PANEL
ALT: 98 U/L — ABNORMAL HIGH (ref 0–53)
AST: 166 U/L — ABNORMAL HIGH (ref 0–37)
Albumin: 3.6 g/dL (ref 3.5–5.2)
Alkaline Phosphatase: 862 U/L — ABNORMAL HIGH (ref 39–117)
Bilirubin, Direct: 8.2 mg/dL — ABNORMAL HIGH (ref 0.0–0.3)
Total Bilirubin: 14.2 mg/dL — ABNORMAL HIGH (ref 0.2–1.2)
Total Protein: 8.2 g/dL (ref 6.0–8.3)

## 2021-03-24 NOTE — Telephone Encounter (Signed)
The pt has been advised to come in for labs.  The order has been entered. The pt has been advised of the information and verbalized understanding.

## 2021-03-24 NOTE — Telephone Encounter (Signed)
-----  Message from Milus Banister, MD sent at 03/22/2021  6:42 AM EDT ----- Ulice Dash, Thanks for reaching out.  Looks like his T bili went up but other LFTs continue to improve.  Our office will contact him on Monday about getting another set of LFTs early next week to get a better idea of trend here.  I am out of town for the next two weeks and so I am including Dr Havery Moros (his primary GI) and Dr. Lyndel Safe (covering biliary next week).  Thanks  Richardson Landry and [place], See above.    Chong Sicilian, He needs LFTs Monday or Tuesday, results sent to Drs. Armbruster and Lyndel Safe.  Thanks  ----- Message ----- From: Orson Slick, MD Sent: 03/21/2021   3:49 PM EDT To: Milus Banister, MD  Dr. Ardis Hughs,  I wanted to reach out to you about Mr. Bordas. He is the patient with borderline resectable pancreatic cancer who recently underwent an ERCP for biliary obstruction. His bili was trending downward from 25.4 prior to the procedure to 6.9 when I met him. He had a port placed today in anticipation of chemotherapy and his bili rose to 10.0. We are planning for chemotherapy next week, please let me know if we need to hold on treatment for further intervention with his biliary issues.  Best,  Armandina Gemma Hematology/Oncology

## 2021-03-25 ENCOUNTER — Telehealth: Payer: Self-pay

## 2021-03-25 ENCOUNTER — Ambulatory Visit: Payer: Self-pay | Admitting: Gastroenterology

## 2021-03-25 NOTE — Telephone Encounter (Signed)
Thank you Patty, I called him and he is going to the hospital to be admitted for this issue given no urgent outpatient access.  Thanks

## 2021-03-25 NOTE — Telephone Encounter (Signed)
Dr Havery Moros I called the hospital and there is no availability for tomorrow to this case.

## 2021-03-25 NOTE — Telephone Encounter (Signed)
-----  Message from Yetta Flock, MD sent at 03/25/2021  2:42 PM EDT -----  ----- Message ----- From: Irving Copas., MD Sent: 03/25/2021  10:56 AM EDT To: Milus Banister, MD, Yetta Flock, MD, #  SA, Unless [facility] can let me have time from 130-330 tomorrow, I am fully booked. I will be away on Thursday and Friday. Elisavet Buehrer, can you see if there is any chance to get some time tomorrow 130-330 at [facility] to follow my last ERCP, otherwise see about Monday at 130 add-on. Otherwise, he will need to come inhouse I suspect. Tonja Jezewski, appreciate trying, I know what they may say but worth trying. DJ is out of the country for the next 2-weeks.  GM ----- Message ----- From: Yetta Flock, MD Sent: 03/25/2021  10:31 AM EDT To: Milus Banister, MD, #  Hey guys, Bilirubin rechecked yesterday PM and now up to 14.2 with mild rise in his other enzymes. He otherwise has no pain or fevers, I asked him to come back for CBC (he had a leukocytosis 2 weeks ago), he can't come back to the lab until tomorrow AM and will do that and repeat LFTs again.   Trying to keep him out of the hospital, obviously if he feels bad / cholangitic will bring him in, but if stable otherwise, Gabe any room by any chance in your outpatient block in near future? If not, Merrie Roof, should we bring him in? Thanks, tough situation.  Richardson Landry    ----- Message ----- From: Jackquline Denmark, MD Sent: 03/22/2021  12:16 PM EDT To: Milus Banister, MD, Timothy Lasso, RN, #  Thanks Linna Hoff Will be available in case he needs ERCP next week Have a good vacation RG   ----- Message ----- From: Milus Banister, MD Sent: 03/22/2021   6:45 AM EDT To: Timothy Lasso, RN, Yetta Flock, MD, #  Ulice Dash, Thanks for reaching out.  Looks like his T bili went up but other LFTs continue to improve.  Our office will contact him on Monday about getting another set of LFTs early next week to get a better idea of trend here.  I am out of town  for the next two weeks and so I am including Dr Havery Moros (his primary GI) and Dr. Lyndel Safe (covering biliary next week).  Thanks  Richardson Landry and [place], See above.    Chong Sicilian, He needs LFTs Monday or Tuesday, results sent to Drs. Armbruster and Lyndel Safe.  Thanks  ----- Message ----- From: Orson Slick, MD Sent: 03/21/2021   3:49 PM EDT To: Milus Banister, MD  Dr. Ardis Hughs,  I wanted to reach out to you about Mr. Gilpatrick. He is the patient with borderline resectable pancreatic cancer who recently underwent an ERCP for biliary obstruction. His bili was trending downward from 25.4 prior to the procedure to 6.9 when I met him. He had a port placed today in anticipation of chemotherapy and his bili rose to 10.0. We are planning for chemotherapy next week, please let me know if we need to hold on treatment for further intervention with his biliary issues.  Best,  Armandina Gemma Hematology/Oncology

## 2021-03-26 ENCOUNTER — Encounter (HOSPITAL_COMMUNITY): Payer: Self-pay | Admitting: Internal Medicine

## 2021-03-26 ENCOUNTER — Inpatient Hospital Stay (HOSPITAL_COMMUNITY)
Admission: EM | Admit: 2021-03-26 | Discharge: 2021-03-28 | DRG: 919 | Disposition: A | Discharge status: Home / Self Care | Payer: Medicaid Other | Attending: Internal Medicine | Admitting: Internal Medicine

## 2021-03-26 ENCOUNTER — Other Ambulatory Visit: Payer: Self-pay

## 2021-03-26 DIAGNOSIS — Z89432 Acquired absence of left foot: Secondary | ICD-10-CM

## 2021-03-26 DIAGNOSIS — Z823 Family history of stroke: Secondary | ICD-10-CM

## 2021-03-26 DIAGNOSIS — E1140 Type 2 diabetes mellitus with diabetic neuropathy, unspecified: Secondary | ICD-10-CM | POA: Diagnosis present

## 2021-03-26 DIAGNOSIS — K8681 Exocrine pancreatic insufficiency: Secondary | ICD-10-CM | POA: Diagnosis present

## 2021-03-26 DIAGNOSIS — K831 Obstruction of bile duct: Secondary | ICD-10-CM | POA: Diagnosis present

## 2021-03-26 DIAGNOSIS — Z833 Family history of diabetes mellitus: Secondary | ICD-10-CM

## 2021-03-26 DIAGNOSIS — K8689 Other specified diseases of pancreas: Secondary | ICD-10-CM | POA: Diagnosis not present

## 2021-03-26 DIAGNOSIS — C259 Malignant neoplasm of pancreas, unspecified: Secondary | ICD-10-CM | POA: Diagnosis present

## 2021-03-26 DIAGNOSIS — F17200 Nicotine dependence, unspecified, uncomplicated: Secondary | ICD-10-CM | POA: Diagnosis present

## 2021-03-26 DIAGNOSIS — I700 Atherosclerosis of aorta: Secondary | ICD-10-CM | POA: Diagnosis present

## 2021-03-26 DIAGNOSIS — Z800 Family history of malignant neoplasm of digestive organs: Secondary | ICD-10-CM

## 2021-03-26 DIAGNOSIS — Z803 Family history of malignant neoplasm of breast: Secondary | ICD-10-CM | POA: Diagnosis not present

## 2021-03-26 DIAGNOSIS — Z20822 Contact with and (suspected) exposure to covid-19: Secondary | ICD-10-CM | POA: Diagnosis present

## 2021-03-26 DIAGNOSIS — E871 Hypo-osmolality and hyponatremia: Secondary | ICD-10-CM | POA: Diagnosis present

## 2021-03-26 DIAGNOSIS — C250 Malignant neoplasm of head of pancreas: Secondary | ICD-10-CM | POA: Diagnosis present

## 2021-03-26 DIAGNOSIS — Z7984 Long term (current) use of oral hypoglycemic drugs: Secondary | ICD-10-CM

## 2021-03-26 DIAGNOSIS — R739 Hyperglycemia, unspecified: Secondary | ICD-10-CM

## 2021-03-26 DIAGNOSIS — T383X6A Underdosing of insulin and oral hypoglycemic [antidiabetic] drugs, initial encounter: Secondary | ICD-10-CM | POA: Diagnosis present

## 2021-03-26 DIAGNOSIS — K219 Gastro-esophageal reflux disease without esophagitis: Secondary | ICD-10-CM | POA: Diagnosis present

## 2021-03-26 DIAGNOSIS — K7460 Unspecified cirrhosis of liver: Secondary | ICD-10-CM | POA: Diagnosis present

## 2021-03-26 DIAGNOSIS — F17210 Nicotine dependence, cigarettes, uncomplicated: Secondary | ICD-10-CM | POA: Diagnosis present

## 2021-03-26 DIAGNOSIS — Z7982 Long term (current) use of aspirin: Secondary | ICD-10-CM

## 2021-03-26 DIAGNOSIS — E440 Moderate protein-calorie malnutrition: Secondary | ICD-10-CM | POA: Insufficient documentation

## 2021-03-26 DIAGNOSIS — E1165 Type 2 diabetes mellitus with hyperglycemia: Secondary | ICD-10-CM | POA: Diagnosis present

## 2021-03-26 DIAGNOSIS — Z6821 Body mass index (BMI) 21.0-21.9, adult: Secondary | ICD-10-CM

## 2021-03-26 DIAGNOSIS — Z8249 Family history of ischemic heart disease and other diseases of the circulatory system: Secondary | ICD-10-CM | POA: Diagnosis not present

## 2021-03-26 DIAGNOSIS — B182 Chronic viral hepatitis C: Secondary | ICD-10-CM | POA: Diagnosis present

## 2021-03-26 DIAGNOSIS — K2970 Gastritis, unspecified, without bleeding: Secondary | ICD-10-CM | POA: Diagnosis present

## 2021-03-26 DIAGNOSIS — F32A Depression, unspecified: Secondary | ICD-10-CM | POA: Diagnosis present

## 2021-03-26 DIAGNOSIS — Z811 Family history of alcohol abuse and dependence: Secondary | ICD-10-CM

## 2021-03-26 DIAGNOSIS — Z91030 Bee allergy status: Secondary | ICD-10-CM

## 2021-03-26 DIAGNOSIS — R17 Unspecified jaundice: Secondary | ICD-10-CM | POA: Diagnosis not present

## 2021-03-26 DIAGNOSIS — Z8619 Personal history of other infectious and parasitic diseases: Secondary | ICD-10-CM

## 2021-03-26 DIAGNOSIS — Z791 Long term (current) use of non-steroidal anti-inflammatories (NSAID): Secondary | ICD-10-CM

## 2021-03-26 DIAGNOSIS — Y92009 Unspecified place in unspecified non-institutional (private) residence as the place of occurrence of the external cause: Secondary | ICD-10-CM

## 2021-03-26 DIAGNOSIS — Z4659 Encounter for fitting and adjustment of other gastrointestinal appliance and device: Secondary | ICD-10-CM | POA: Diagnosis not present

## 2021-03-26 DIAGNOSIS — T85898A Other specified complication of other internal prosthetic devices, implants and grafts, initial encounter: Secondary | ICD-10-CM | POA: Diagnosis present

## 2021-03-26 DIAGNOSIS — I10 Essential (primary) hypertension: Secondary | ICD-10-CM

## 2021-03-26 DIAGNOSIS — K2100 Gastro-esophageal reflux disease with esophagitis, without bleeding: Secondary | ICD-10-CM

## 2021-03-26 DIAGNOSIS — E785 Hyperlipidemia, unspecified: Secondary | ICD-10-CM | POA: Diagnosis present

## 2021-03-26 DIAGNOSIS — Z86010 Personal history of colonic polyps: Secondary | ICD-10-CM

## 2021-03-26 DIAGNOSIS — Y732 Prosthetic and other implants, materials and accessory gastroenterology and urology devices associated with adverse incidents: Secondary | ICD-10-CM | POA: Diagnosis present

## 2021-03-26 DIAGNOSIS — Z79899 Other long term (current) drug therapy: Secondary | ICD-10-CM

## 2021-03-26 DIAGNOSIS — K529 Noninfective gastroenteritis and colitis, unspecified: Secondary | ICD-10-CM | POA: Diagnosis present

## 2021-03-26 DIAGNOSIS — Z794 Long term (current) use of insulin: Secondary | ICD-10-CM

## 2021-03-26 DIAGNOSIS — E1142 Type 2 diabetes mellitus with diabetic polyneuropathy: Secondary | ICD-10-CM

## 2021-03-26 DIAGNOSIS — E739 Lactose intolerance, unspecified: Secondary | ICD-10-CM | POA: Diagnosis present

## 2021-03-26 LAB — RESP PANEL BY RT-PCR (FLU A&B, COVID) ARPGX2
Influenza A by PCR: NEGATIVE
Influenza B by PCR: NEGATIVE
SARS Coronavirus 2 by RT PCR: NEGATIVE

## 2021-03-26 LAB — CBC WITH DIFFERENTIAL/PLATELET
Abs Immature Granulocytes: 0.06 10*3/uL (ref 0.00–0.07)
Basophils Absolute: 0.1 10*3/uL (ref 0.0–0.1)
Basophils Relative: 1 %
Eosinophils Absolute: 0.1 10*3/uL (ref 0.0–0.5)
Eosinophils Relative: 1 %
HCT: 34.5 % — ABNORMAL LOW (ref 39.0–52.0)
Hemoglobin: 11.9 g/dL — ABNORMAL LOW (ref 13.0–17.0)
Immature Granulocytes: 1 %
Lymphocytes Relative: 14 %
Lymphs Abs: 1.5 10*3/uL (ref 0.7–4.0)
MCH: 32.2 pg (ref 26.0–34.0)
MCHC: 34.5 g/dL (ref 30.0–36.0)
MCV: 93.2 fL (ref 80.0–100.0)
Monocytes Absolute: 1 10*3/uL (ref 0.1–1.0)
Monocytes Relative: 10 %
Neutro Abs: 7.5 10*3/uL (ref 1.7–7.7)
Neutrophils Relative %: 73 %
Platelets: 230 10*3/uL (ref 150–400)
RBC: 3.7 MIL/uL — ABNORMAL LOW (ref 4.22–5.81)
RDW: 13.7 % (ref 11.5–15.5)
WBC: 10.2 10*3/uL (ref 4.0–10.5)
nRBC: 0 % (ref 0.0–0.2)

## 2021-03-26 LAB — PROTIME-INR
INR: 1.2 (ref 0.8–1.2)
Prothrombin Time: 15.2 seconds (ref 11.4–15.2)

## 2021-03-26 LAB — COMPREHENSIVE METABOLIC PANEL
ALT: 88 U/L — ABNORMAL HIGH (ref 0–44)
AST: 142 U/L — ABNORMAL HIGH (ref 15–41)
Albumin: 2.5 g/dL — ABNORMAL LOW (ref 3.5–5.0)
Alkaline Phosphatase: 778 U/L — ABNORMAL HIGH (ref 38–126)
Anion gap: 10 (ref 5–15)
BUN: 6 mg/dL (ref 6–20)
CO2: 26 mmol/L (ref 22–32)
Calcium: 8.8 mg/dL — ABNORMAL LOW (ref 8.9–10.3)
Chloride: 91 mmol/L — ABNORMAL LOW (ref 98–111)
Creatinine, Ser: 0.46 mg/dL — ABNORMAL LOW (ref 0.61–1.24)
GFR, Estimated: >60 mL/min (ref >60)
Glucose, Bld: 559 mg/dL (ref 70–99)
Potassium: 4.6 mmol/L (ref 3.5–5.1)
Sodium: 127 mmol/L — ABNORMAL LOW (ref 135–145)
Total Bilirubin: 11.7 mg/dL — ABNORMAL HIGH (ref 0.3–1.2)
Total Protein: 7.6 g/dL (ref 6.5–8.1)

## 2021-03-26 LAB — CBG MONITORING, ED
Glucose-Capillary: 487 mg/dL — ABNORMAL HIGH (ref 70–99)
Glucose-Capillary: 294 mg/dL — ABNORMAL HIGH (ref 70–99)
Glucose-Capillary: 332 mg/dL — ABNORMAL HIGH (ref 70–99)
Glucose-Capillary: 362 mg/dL — ABNORMAL HIGH (ref 70–99)

## 2021-03-26 LAB — LIPASE, BLOOD: Lipase: 14 U/L (ref 11–51)

## 2021-03-26 LAB — GLUCOSE, CAPILLARY: Glucose-Capillary: 201 mg/dL — ABNORMAL HIGH (ref 70–99)

## 2021-03-26 MED ORDER — ALBUTEROL SULFATE (2.5 MG/3ML) 0.083% IN NEBU: 2.5000 mg | INHALATION_SOLUTION | Freq: Four times a day (QID) | RESPIRATORY_TRACT | Status: DC | PRN

## 2021-03-26 MED ORDER — SODIUM CHLORIDE 0.9 % IV SOLN: INTRAVENOUS | Status: DC

## 2021-03-26 MED ORDER — INSULIN ASPART 100 UNIT/ML IJ SOLN
0.0000 [IU] | Freq: Three times a day (TID) | INTRAMUSCULAR | Status: DC
  Administered 2021-03-26: 15 [IU] via SUBCUTANEOUS
  Administered 2021-03-27 (×2): 8 [IU] via SUBCUTANEOUS
  Administered 2021-03-28: 11 [IU] via SUBCUTANEOUS
  Administered 2021-03-28: 15 [IU] via SUBCUTANEOUS
  Filled 2021-03-26: qty 0.15

## 2021-03-26 MED ORDER — MORPHINE SULFATE (PF) 2 MG/ML IV SOLN
2.0000 mg | INTRAVENOUS | Status: DC | PRN
Start: 2021-03-26 — End: 2021-03-28
  Administered 2021-03-27 (×3): 2 mg via INTRAVENOUS
  Filled 2021-03-26 (×4): qty 1

## 2021-03-26 MED ORDER — PANCRELIPASE (LIP-PROT-AMYL) 12000-38000 UNITS PO CPEP
36000.0000 [IU] | ORAL_CAPSULE | Freq: Three times a day (TID) | ORAL | Status: DC
  Administered 2021-03-27 – 2021-03-28 (×2): 36000 [IU] via ORAL
  Filled 2021-03-26: qty 3
  Filled 2021-03-26: qty 1
  Filled 2021-03-26: qty 3
  Filled 2021-03-26: qty 1

## 2021-03-26 MED ORDER — INSULIN ASPART 100 UNIT/ML IJ SOLN
8.0000 [IU] | Freq: Once | INTRAMUSCULAR | Status: AC
  Administered 2021-03-26: 8 [IU] via INTRAVENOUS
  Filled 2021-03-26: qty 0.08

## 2021-03-26 MED ORDER — LOSARTAN POTASSIUM 50 MG PO TABS
50.0000 mg | ORAL_TABLET | Freq: Every day | ORAL | Status: DC
  Administered 2021-03-27 – 2021-03-28 (×2): 50 mg via ORAL
  Filled 2021-03-26: qty 2
  Filled 2021-03-26: qty 1

## 2021-03-26 MED ORDER — SODIUM CHLORIDE 0.9 % IV BOLUS
1000.0000 mL | Freq: Once | INTRAVENOUS | Status: AC
  Administered 2021-03-26: 1000 mL via INTRAVENOUS

## 2021-03-26 MED ORDER — MIRTAZAPINE 15 MG PO TABS
30.0000 mg | ORAL_TABLET | Freq: Every day | ORAL | Status: DC
  Administered 2021-03-26 – 2021-03-27 (×2): 30 mg via ORAL
  Filled 2021-03-26: qty 1
  Filled 2021-03-26 (×2): qty 2

## 2021-03-26 MED ORDER — PANTOPRAZOLE SODIUM 40 MG PO TBEC
40.0000 mg | DELAYED_RELEASE_TABLET | Freq: Every day | ORAL | Status: DC
  Administered 2021-03-27 – 2021-03-28 (×2): 40 mg via ORAL
  Filled 2021-03-26 (×2): qty 1

## 2021-03-26 MED ORDER — NICOTINE 21 MG/24HR TD PT24
21.0000 mg | MEDICATED_PATCH | Freq: Every day | TRANSDERMAL | Status: DC
  Administered 2021-03-27 – 2021-03-28 (×2): 21 mg via TRANSDERMAL
  Filled 2021-03-26 (×2): qty 1

## 2021-03-26 MED ORDER — DULOXETINE HCL 60 MG PO CPEP
60.0000 mg | ORAL_CAPSULE | Freq: Every day | ORAL | Status: DC
  Administered 2021-03-27 – 2021-03-28 (×2): 60 mg via ORAL
  Filled 2021-03-26: qty 2
  Filled 2021-03-26: qty 1

## 2021-03-26 MED ORDER — ONDANSETRON HCL 4 MG PO TABS: 4.0000 mg | ORAL_TABLET | Freq: Four times a day (QID) | ORAL | Status: DC | PRN

## 2021-03-26 MED ORDER — ONDANSETRON HCL 4 MG/2ML IJ SOLN
4.0000 mg | Freq: Four times a day (QID) | INTRAMUSCULAR | Status: DC | PRN
  Administered 2021-03-27: 4 mg via INTRAVENOUS

## 2021-03-26 MED ORDER — ADULT MULTIVITAMIN W/MINERALS CH
1.0000 | ORAL_TABLET | Freq: Every day | ORAL | Status: DC
  Administered 2021-03-27 – 2021-03-28 (×2): 1 via ORAL
  Filled 2021-03-26 (×2): qty 1

## 2021-03-26 MED ORDER — FOLIC ACID 1 MG PO TABS
1.0000 mg | ORAL_TABLET | Freq: Every day | ORAL | Status: DC
  Administered 2021-03-27 – 2021-03-28 (×2): 1 mg via ORAL
  Filled 2021-03-26 (×2): qty 1

## 2021-03-26 MED FILL — Dexamethasone Sodium Phosphate Inj 100 MG/10ML: INTRAMUSCULAR | Qty: 1 | Status: AC

## 2021-03-26 MED FILL — Fosaprepitant Dimeglumine For IV Infusion 150 MG (Base Eq): INTRAVENOUS | Qty: 5 | Status: AC

## 2021-03-26 NOTE — Consult Note (Addendum)
Referring Provider:  [facility]         Primary Care Physician:  [name], NP Primary Gastroenterologist:  Black Jack Cellar, MD We were asked to see this patient for:  elevated liver tests, pancreatic cancer.                 Attending physician's note   I have taken a history, examined the patient and reviewed the chart. I agree with the Advanced Practitioner's note, impression and recommendations.  47 yr M with recent diagnosis of pancreatic adeno ca with biliary obstruction s/p plastic biliary stent admitted with worsening LFT.   He is planning to undergo neoadjuvant chemo followed by resection of pancreatic ca  Other relevant medical history include chronic hep C s/p treatment, no evidence of cirrhosis or decompensation based on labs or exam  Pancreatic insufficiency: Creon 720000 units with meals and 36000 units with snacks  Plan for ERCP tomorrow with Dr Lyndel Safe and stent exchange The risks and benefits as well as alternatives of endoscopic procedure(s) have been discussed and reviewed. All questions answered. The patient agrees to proceed.  The patient was provided an opportunity to ask questions and all were answered. The patient agreed with the plan and demonstrated an understanding of the instructions.  Damaris Hippo , MD [phone]     ASSESSMENT / PLAN   # 60 yo male with newly diagnosed HOP adenocarcinoma , stage IIB. He is s/p plastic biliary stent placement on 6/30 ( Dr. Ardis Hughs). Cancer is borderline resectable, he has been evaluated by Dr. Zenia Resides with [facility]. Plan is for neoadjuvant chemotherapy which was supposed to start tomorrow but patient being admitted with worsening hyperbilirubinemia. Plastic stent may be occluded.  --Patient to be admitted by [facility]. Plan is for ERCP for stent evaluation / exchange tomorrow with Dr. Lyndel Safe.  --NPO after MN.   # Chronic diarrhea ( since March 2022) despite pancreatic enzyme replacement. He carries a diagnosis of  exocrine pancreatic insufficiency. C-diff negative in March.   # Diabetes, glucose 599. Patient stopped taking insulin injections a few days ago  #  ? Cirrhosis based on our office notes.  No evidence for decompensation. INR normal.    HISTORY OF PRESENT ILLNESS                                                                                                                         Chief Complaint:  jaundice  [name] [name] is a 60 y.o. male with a past medical history significant for GERD, Barrett's esophagus ( 2cm), colon polyps ( TVA), hyperlipidemia, exocrine pancreatic insufficiency, HCV (treated), ? Cirrhosis, and pancreatic cancer.   Patient has a history of tubulovillous adenomas of colon. No adenomatous colon polyps on surveillance colonoscopy in Sept 2018. He also has Barrett's esophagus, didn't get surveillance EGD in 2019. However esophagus endoscopically normal on EUS in late June 2022  Patient diagnosed with pancreatic head adenocarcinoma in late June 2022. EUS showed a  4.3cm mass in the head of pancreas causing obstruction of the biliary and pancreatic ducts and clearly invading the main portal vein/SMV near the confluence for 1-2cm. Suspicious, enlarged porto-caval node. Preliminary cytology review of the mass was + for malignancy (adenocarcinoma).  Subsequent ERCP showed a 1.5cm long malignant distal CBD stricture, treated with minor biliary sphincterotomy and then placement of a 10Fr 5cm long plastic biliary stent. No evidence for metastatic disease on CT chest. Patient say Dr.Allen with [facility]. Pancreatic cancer felt to be borderline resectable with a Whipple and possible SMV reconstruction for which she would likely refer him to a tertiary care center. Plan was for neoadjuvant chemotherapy with Dr. Lorenso Courier.   Dr. Lorenso Courier noticed [place] a few days ago that labs bilirubin ( which had been declining post stent placement) had increased from 6.9 to 10. Additionally his WBC was up slightly to  11.9. On repeat labs two days ago his Tbili was up to 14, Alk phos up from 597 to 862. Patient was advised to go to ED  In the ED today his Tbili is down to 11.7, alk phos down to 778. WBC normal at 10.2. T 99.5.   Patient endorses constant lower abdominal pain until he stopped insulin injections a few days ago. He says that blood sugars sometimes went too low on insulin. No nausea or vomiting. He has chronic diarrhea darting back to March when he had partial amputation of foot. C-diff negative at that time.    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    Past Surgical History:  Procedure Laterality Date   AMPUTATION Left 11/15/2020   Procedure: LEFT TRANSMETATARSALS AMPUTATION;  Surgeon: Newt Minion, MD;  Location: [facility];  Service: Orthopedics;  Laterality: Left;   APPENDECTOMY     BILIARY STENT PLACEMENT N/A 02/27/2021   Procedure: BILIARY STENT PLACEMENT;  Surgeon: Milus Banister, MD;  Location: [facility];  Service: Endoscopy;  Laterality: N/A;   COLONOSCOPY N/A 02/16/2014   Procedure: COLONOSCOPY;  Surgeon: Gatha Mayer, MD;  Location: [facility];  Service: Endoscopy;  Laterality: N/A;   COLONOSCOPY     ENDOSCOPIC RETROGRADE CHOLANGIOPANCREATOGRAPHY (ERCP) WITH PROPOFOL N/A 02/27/2021   Procedure: ENDOSCOPIC RETROGRADE CHOLANGIOPANCREATOGRAPHY (ERCP) WITH PROPOFOL;  Surgeon: Milus Banister, MD;  Location: [facility];  Service: Endoscopy;  Laterality: N/A;   EUS N/A 02/27/2021   Procedure: UPPER ENDOSCOPIC ULTRASOUND (EUS) RADIAL;  Surgeon: Milus Banister, MD;  Location: [facility];   Service: Endoscopy;  Laterality: N/A;   FINE NEEDLE ASPIRATION N/A 02/27/2021   Procedure: FINE NEEDLE ASPIRATION (FNA) LINEAR;  Surgeon: Milus Banister, MD;  Location: [facility];  Service: Endoscopy;  Laterality: N/A;   IR IMAGING GUIDED PORT INSERTION  03/21/2021   SPHINCTEROTOMY  02/27/2021   Procedure: SPHINCTEROTOMY;  Surgeon: Milus Banister, MD;  Location: [facility];  Service: Endoscopy;;   TONSILLECTOMY      Prior to Admission medications   Medication Sig Start Date End Date Taking? Authorizing Provider  acetaminophen (TYLENOL) 325 MG tablet Take 2  tablets (650 mg total) by mouth every 6 (six) hours as needed for mild pain (or Fever >/= 101). Patient not taking: Reported on 03/12/2021 11/26/20   Cherene Altes, MD  albuterol (VENTOLIN HFA) 108 (90 Base) MCG/ACT inhaler Inhale 2 puffs into the lungs every 6 (six) hours as needed for wheezing or shortness of breath. 12/05/20   Gerlene Fee, NP  aspirin EC 81 MG tablet Take 1 tablet (81 mg total) by mouth daily. 04/02/17   Funches, Adriana Mccallum, MD  atorvastatin (LIPITOR) 40 MG tablet TAKE 1 TABLET (40 MG TOTAL) BY MOUTH DAILY. Patient taking differently: Take 40 mg by mouth in the morning. 12/05/20   Gerlene Fee, NP  DULoxetine (CYMBALTA) 60 MG capsule TAKE 1 CAPSULE (60 MG TOTAL) BY MOUTH DAILY. Patient taking differently: Take 60 mg by mouth in the morning. 09/16/20 09/16/21  [name], NP  folic acid (FOLVITE) 1 MG tablet Take 1 tablet (1 mg total) by mouth daily. Patient taking differently: Take 1 mg by mouth in the morning. 12/05/20   Gerlene Fee, NP  glipiZIDE (GLUCOTROL) 5 MG tablet Take 5 mg by mouth daily before breakfast. Please fill as a 90 day supply 02/26/21 02/26/22  [provider]  glucose blood (TRUE METRIX BLOOD GLUCOSE TEST) test strip Use as instructed. Check blood glucose level by fingerstick 3-4 times per day.  E11.65 03/04/21   [name], NP  ibuprofen (ADVIL) 800 MG tablet Take 800 mg by  mouth every 8 (eight) hours as needed.    [provider]  insulin glargine (LANTUS) 100 UNIT/ML injection Inject 30 Units into the skin 2 (two) times daily.    [provider]  Insulin Pen Needle (TRUEPLUS PEN NEEDLES) 32G X 4 MM MISC Use as instructed. Inject into the skin twice daily 03/04/21   [name], NP  lidocaine-prilocaine (EMLA) cream Apply 1 application topically as directed as needed. 03/12/21   Orson Slick, MD  lipase/protease/amylase (CREON) 36000 UNITS CPEP capsule Take 2 capsules (72,000 Units total) by mouth 3 (three) times daily with meals. May also take 1 capsule (36,000 Units total) as needed (with snacks). 03/21/21   Orson Slick, MD  losartan (COZAAR) 50 MG tablet TAKE 1 TABLET (50 MG TOTAL) BY MOUTH DAILY. PLEASE FILL AS A 90 DAY SUPPLY 03/05/21 03/05/22  Charlott Rakes, MD  meloxicam (MOBIC) 7.5 MG tablet TAKE 1 TABLET (7.5 MG TOTAL) BY MOUTH DAILY. 03/05/21   Charlott Rakes, MD  mirtazapine (REMERON) 30 MG tablet Take 1 tablet (30 mg total) by mouth at bedtime. 01/07/21 04/07/21  [name], NP  Multiple Vitamins-Minerals (THEREMS-M) TABS Take 1 tablet by mouth in the morning.    [provider]  omeprazole (PRILOSEC) 40 MG capsule TAKE 1 CAPSULE (40 MG TOTAL) BY MOUTH DAILY. Patient taking differently: Take 40 mg by mouth in the morning. 12/05/20 12/05/21  Gerlene Fee, NP  ondansetron (ZOFRAN) 8 MG tablet Take 1 tablet (8 mg total) by mouth every 8 (eight) hours as needed for nausea or vomiting. 03/12/21   Orson Slick, MD  oxyCODONE (OXY IR/ROXICODONE) 5 MG immediate release tablet Take 1-2 tablets (5-10 mg total) by mouth every 6 (six) hours as needed for severe pain. 03/12/21   Orson Slick, MD  prochlorperazine (COMPAZINE) 10 MG tablet Take 1 tablet (10 mg total) by mouth every 6 (six) hours as needed for nausea or vomiting. 03/12/21   Lorenso Courier,  Verita Lamb, MD  TRUEplus Lancets 28G MISC Use as instructed. Check blood glucose  level by fingerstick 3-4 times per day.   E11.65 03/04/21   [name], NP    Current Facility-Administered Medications  Medication Dose Route Frequency Provider Last Rate Last Admin   0.9 %  sodium chloride infusion   Intravenous Continuous Agbata, Tochukwu, MD       albuterol (VENTOLIN HFA) 108 (90 Base) MCG/ACT inhaler 2 puff  2 puff Inhalation Q6H PRN Agbata, Tochukwu, MD       [START ON 03/27/2021] DULoxetine (CYMBALTA) DR capsule 60 mg  60 mg Oral q AM Agbata, Tochukwu, MD       [START ON 0/16/0109] folic acid (FOLVITE) tablet 1 mg  1 mg Oral q AM Agbata, Tochukwu, MD       insulin aspart (novoLOG) injection 0-15 Units  0-15 Units Subcutaneous TID WC Agbata, Tochukwu, MD       lipase/protease/amylase (CREON) capsule 36,000 Units  36,000 Units Oral TID AC Agbata, Tochukwu, MD       losartan (COZAAR) tablet 50 mg  50 mg Oral Daily Agbata, Tochukwu, MD       mirtazapine (REMERON) tablet 30 mg  30 mg Oral QHS Agbata, Tochukwu, MD       morphine 2 MG/ML injection 2 mg  2 mg Intravenous Q4H PRN Agbata, Tochukwu, MD       nicotine (NICODERM CQ - dosed in mg/24 hours) patch 21 mg  21 mg Transdermal Daily Agbata, Tochukwu, MD       ondansetron (ZOFRAN) tablet 4 mg  4 mg Oral Q6H PRN Agbata, Tochukwu, MD       Or   ondansetron (ZOFRAN) injection 4 mg  4 mg Intravenous Q6H PRN Agbata, Tochukwu, MD       pantoprazole (PROTONIX) EC tablet 40 mg  40 mg Oral Daily Agbata, Tochukwu, MD       [START ON 03/27/2021] Therems-M TABS 1 tablet  1 tablet Oral q AM Agbata, Tochukwu, MD       Current Outpatient Medications  Medication Sig Dispense Refill   acetaminophen (TYLENOL) 325 MG tablet Take 2 tablets (650 mg total) by mouth every 6 (six) hours as needed for mild pain (or Fever >/= 101). (Patient not taking: Reported on 03/12/2021)     albuterol (VENTOLIN HFA) 108 (90 Base) MCG/ACT inhaler Inhale 2 puffs into the lungs every 6 (six) hours as needed for wheezing or shortness of breath. 18 g 0   aspirin  EC 81 MG tablet Take 1 tablet (81 mg total) by mouth daily. 30 tablet 11   atorvastatin (LIPITOR) 40 MG tablet TAKE 1 TABLET (40 MG TOTAL) BY MOUTH DAILY. (Patient taking differently: Take 40 mg by mouth in the morning.) 30 tablet 0   DULoxetine (CYMBALTA) 60 MG capsule TAKE 1 CAPSULE (60 MG TOTAL) BY MOUTH DAILY. (Patient taking differently: Take 60 mg by mouth in the morning.) 90 capsule 1   folic acid (FOLVITE) 1 MG tablet Take 1 tablet (1 mg total) by mouth daily. (Patient taking differently: Take 1 mg by mouth in the morning.) 30 tablet 0   glipiZIDE (GLUCOTROL) 5 MG tablet Take 5 mg by mouth daily before breakfast. Please fill as a 90 day supply 90 tablet 1   glucose blood (TRUE METRIX BLOOD GLUCOSE TEST) test strip Use as instructed. Check blood glucose level by fingerstick 3-4 times per day.  E11.65 200 each 12   ibuprofen (ADVIL) 800 MG tablet  Take 800 mg by mouth every 8 (eight) hours as needed.     insulin glargine (LANTUS) 100 UNIT/ML injection Inject 30 Units into the skin 2 (two) times daily.     Insulin Pen Needle (TRUEPLUS PEN NEEDLES) 32G X 4 MM MISC Use as instructed. Inject into the skin twice daily 200 each 6   lidocaine-prilocaine (EMLA) cream Apply 1 application topically as directed as needed. 30 g 0   lipase/protease/amylase (CREON) 36000 UNITS CPEP capsule Take 2 capsules (72,000 Units total) by mouth 3 (three) times daily with meals. May also take 1 capsule (36,000 Units total) as needed (with snacks). 56 capsule 0   losartan (COZAAR) 50 MG tablet TAKE 1 TABLET (50 MG TOTAL) BY MOUTH DAILY. PLEASE FILL AS A 90 DAY SUPPLY 30 tablet 1   meloxicam (MOBIC) 7.5 MG tablet TAKE 1 TABLET (7.5 MG TOTAL) BY MOUTH DAILY. 30 tablet 1   mirtazapine (REMERON) 30 MG tablet Take 1 tablet (30 mg total) by mouth at bedtime. 90 tablet 1   Multiple Vitamins-Minerals (THEREMS-M) TABS Take 1 tablet by mouth in the morning.     omeprazole (PRILOSEC) 40 MG capsule TAKE 1 CAPSULE (40 MG TOTAL) BY  MOUTH DAILY. (Patient taking differently: Take 40 mg by mouth in the morning.) 30 capsule 0   ondansetron (ZOFRAN) 8 MG tablet Take 1 tablet (8 mg total) by mouth every 8 (eight) hours as needed for nausea or vomiting. 30 tablet 0   oxyCODONE (OXY IR/ROXICODONE) 5 MG immediate release tablet Take 1-2 tablets (5-10 mg total) by mouth every 6 (six) hours as needed for severe pain. 60 tablet 0   prochlorperazine (COMPAZINE) 10 MG tablet Take 1 tablet (10 mg total) by mouth every 6 (six) hours as needed for nausea or vomiting. 30 tablet 0   TRUEplus Lancets 28G MISC Use as instructed. Check blood glucose level by fingerstick 3-4 times per day.   E11.65 200 each 3    Allergies as of 03/26/2021 - Review Complete 03/26/2021  Allergen Reaction Noted   Bee venom Anaphylaxis 12/16/2017   Lactose intolerance (gi) Other (See Comments) 02/13/2019    Family History  Problem Relation Age of Onset   Breast cancer Mother    Stroke Father    Alcohol abuse Father    Heart disease Maternal Grandfather    Pancreatic cancer Paternal Grandmother    Diabetes Paternal Grandfather    Colon cancer Neg Hx    Stomach cancer Neg Hx    Rectal cancer Neg Hx    Esophageal cancer Neg Hx     Social History   Socioeconomic History   Marital status: Single    Spouse name: Not on file   Number of children: Not on file   Years of education: Not on file   Highest education level: Not on file  Occupational History   Not on file  Tobacco Use   Smoking status: Every Day    Packs/day: 0.50    Years: 35.00    Pack years: 17.50    Types: Cigarettes   Smokeless tobacco: Never  Vaping Use   Vaping Use: Never used  Substance and Sexual Activity   Alcohol use: Not Currently    Comment: previous   Drug use: No   Sexual activity: Not on file  Other Topics Concern   Not on file  Social History Narrative   Not on file   Social Determinants of Health   Financial Resource Strain: High Risk   Difficulty  of Paying  Living Expenses: Very hard  Food Insecurity: Not on file  Transportation Needs: Unmet Transportation Needs   Lack of Transportation (Medical): Yes   Lack of Transportation (Non-Medical): Yes  Physical Activity: Not on file  Stress: Not on file  Social Connections: Not on file  Intimate Partner Violence: Not on file    Review of Systems: All systems reviewed and negative except where noted in HPI.     OBJECTIVE   Physical Exam   Physical Exam: Vital signs in last 24 hours: Temp:  [98 F (36.7 C)-99.5 F (37.5 C)] 99.5 F (37.5 C) (07/27 1050) Pulse Rate:  [79-88] 79 (07/27 1318) Resp:  [16-18] 18 (07/27 1318) BP: (125-156)/(66-86) 125/66 (07/27 1318) SpO2:  [96 %-100 %] 99 % (07/27 1318) Weight:  [66.7 kg] 66.7 kg (07/27 1047)   General:   Alert  male in NAD Psych:  Pleasant, cooperative. Normal mood and affect. Eyes:  Pupils equal,icteric sclera Ears:  Normal auditory acuity. Nose:  No deformity, discharge,  or lesions. Neck:  Supple; no masses Lungs:  Clear throughout to auscultation.   No wheezes, crackles, or rhonchi.  Heart:  Regular rate and rhythm;  no lower extremity edema Abdomen:  Soft, non-distended, nontender, BS active Rectal:  Deferred  Msk:  Symmetrical without gross deformities. . Neurologic:  Alert and  oriented x4;  grossly normal neurologically. Skin:  Intact without significant lesions or rashes.  Filed Weights   03/26/21 1047  Weight: 66.7 kg     Scheduled inpatient medications  [START ON 03/27/2021] DULoxetine  60 mg Oral q AM   [START ON 01/08/2584] folic acid  1 mg Oral q AM   insulin aspart  0-15 Units Subcutaneous TID WC   lipase/protease/amylase  36,000 Units Oral TID AC   losartan  50 mg Oral Daily   mirtazapine  30 mg Oral QHS   nicotine  21 mg Transdermal Daily   pantoprazole  40 mg Oral Daily   [START ON 03/27/2021] Therems-M  1 tablet Oral q AM      Intake/Output from previous day: No intake/output data  recorded. Intake/Output this shift: No intake/output data recorded.   Lab Results: Recent Labs    03/26/21 1048  WBC 10.2  HGB 11.9*  HCT 34.5*  PLT 230   BMET Recent Labs    03/26/21 1048  NA 127*  K 4.6  CL 91*  CO2 26  GLUCOSE 559*  BUN 6  CREATININE 0.46*  CALCIUM 8.8*   LFT Recent Labs    03/24/21 1238 03/26/21 1048  PROT 8.2 7.6  ALBUMIN 3.6 2.5*  AST 166* 142*  ALT 98* 88*  ALKPHOS 862* 778*  BILITOT 14.2* 11.7*  BILIDIR 8.2*  --    PT/INR Recent Labs    03/26/21 1048  LABPROT 15.2  INR 1.2   Hepatitis Panel No results for input(s): HEPBSAG, HCVAB, HEPAIGM, HEPBIGM in the last 72 hours.   . CBC Latest Ref Rng & Units 03/26/2021 03/12/2021 02/26/2021  WBC 4.0 - 10.5 K/uL 10.2 11.9(H) 9.3  Hemoglobin 13.0 - 17.0 g/dL 11.9(L) 12.2(L) 12.9(L)  Hematocrit 39.0 - 52.0 % 34.5(L) 36.8(L) 36.6(L)  Platelets 150 - 400 K/uL 230 275 297.0    . CMP Latest Ref Rng & Units 03/26/2021 03/24/2021 03/21/2021  Glucose 70 - 99 mg/dL 559(HH) - 180(H)  BUN 6 - 20 mg/dL 6 - 10  Creatinine 0.61 - 1.24 mg/dL 0.46(L) - <0.30(L)  Sodium 135 - 145 mmol/L 127(L) - 135  Potassium 3.5 - 5.1 mmol/L 4.6 - 3.6  Chloride 98 - 111 mmol/L 91(L) - 101  CO2 22 - 32 mmol/L 26 - 23  Calcium 8.9 - 10.3 mg/dL 8.8(L) - 9.1  Total Protein 6.5 - 8.1 g/dL 7.6 8.2 7.7  Total Bilirubin 0.3 - 1.2 mg/dL 11.7(H) 14.2(H) 10.0(H)  Alkaline Phos 38 - 126 U/L 778(H) 862(H) 597(H)  AST 15 - 41 U/L 142(H) 166(H) 178(H)  ALT 0 - 44 U/L 88(H) 98(H) 80(H)   Studies/Results: No results found.  Active Problems:   Hyperglycemia due to diabetes mellitus ([place])    Tye Savoy, NP-C @  03/26/2021, 2:10 PM

## 2021-03-26 NOTE — ED Notes (Signed)
Pt given ham and [place] sandwich.

## 2021-03-26 NOTE — ED Triage Notes (Signed)
Pt presents to ED via POV cc elevated liver enzymes and bilirubin. Pt states he was sent by Dr. Havery Moros due to his abnormal labs. Pt denies abd pain, cp, sob, headache, dizziness. Pt states consistent diarrhea, but hasn't changed since onset in March. Pt A&Ox4. Respirations equal, unlabored.

## 2021-03-26 NOTE — H&P (View-Only) (Signed)
 Referring Provider:  [facility]         Primary Care Physician:  Fleming, Zelda W, NP Primary Gastroenterologist:  Steven Armbruster, MD We were asked to see this patient for:  elevated liver tests, pancreatic cancer.                 Attending physician's note   I have taken a history, examined the patient and reviewed the chart. I agree with the Advanced Practitioner's note, impression and recommendations.  60 yr M with recent diagnosis of pancreatic adeno ca with biliary obstruction s/p plastic biliary stent admitted with worsening LFT.   He is planning to undergo neoadjuvant chemo followed by resection of pancreatic ca  Other relevant medical history include chronic hep C s/p treatment, no evidence of cirrhosis or decompensation based on labs or exam  Pancreatic insufficiency: Creon 720000 units with meals and 36000 units with snacks  Plan for ERCP tomorrow with Dr Gupta and stent exchange The risks and benefits as well as alternatives of endoscopic procedure(s) have been discussed and reviewed. All questions answered. The patient agrees to proceed.  The patient was provided an opportunity to ask questions and all were answered. The patient agreed with the plan and demonstrated an understanding of the instructions.  K. Veena Arshia Rondon , MD [phone]     ASSESSMENT / PLAN   # 60 yo male with newly diagnosed HOP adenocarcinoma , stage IIB. He is s/p plastic biliary stent placement on 6/30 ( Dr. Jacobs). Cancer is borderline resectable, he has been evaluated by Dr. Allen with [facility]. Plan is for neoadjuvant chemotherapy which was supposed to start tomorrow but patient being admitted with worsening hyperbilirubinemia. Plastic stent may be occluded.  --Patient to be admitted by [facility]. Plan is for ERCP for stent evaluation / exchange tomorrow with Dr. Gupta.  --NPO after MN.   # Chronic diarrhea ( since March 2022) despite pancreatic enzyme replacement. He carries a diagnosis of  exocrine pancreatic insufficiency. C-diff negative in March.   # Diabetes, glucose 599. Patient stopped taking insulin injections a few days ago  #  ? Cirrhosis based on our office notes.  No evidence for decompensation. INR normal.    HISTORY OF PRESENT ILLNESS                                                                                                                         Chief Complaint:  jaundice  [name] [name] is a 60 y.o. male with a past medical history significant for GERD, Barrett's esophagus ( 2cm), colon polyps ( TVA), hyperlipidemia, exocrine pancreatic insufficiency, HCV (treated), ? Cirrhosis, and pancreatic cancer.   Patient has a history of tubulovillous adenomas of colon. No adenomatous colon polyps on surveillance colonoscopy in Sept 2018. He also has Barrett's esophagus, didn't get surveillance EGD in 2019. However esophagus endoscopically normal on EUS in late June 2022  Patient diagnosed with pancreatic head adenocarcinoma in late June 2022. EUS showed a   4.3cm mass in the head of pancreas causing obstruction of the biliary and pancreatic ducts and clearly invading the main portal vein/SMV near the confluence for 1-2cm. Suspicious, enlarged porto-caval node. Preliminary cytology review of the mass was + for malignancy (adenocarcinoma).  Subsequent ERCP showed a 1.5cm long malignant distal CBD stricture, treated with minor biliary sphincterotomy and then placement of a 10Fr 5cm long plastic biliary stent. No evidence for metastatic disease on CT chest. Patient say Dr.Allen with [facility]. Pancreatic cancer felt to be borderline resectable with a Whipple and possible SMV reconstruction for which she would likely refer him to a tertiary care center. Plan was for neoadjuvant chemotherapy with Dr. Dorsey.   Dr. Dorsey noticed us a few days ago that labs bilirubin ( which had been declining post stent placement) had increased from 6.9 to 10. Additionally his WBC was up slightly to  11.9. On repeat labs two days ago his Tbili was up to 14, Alk phos up from 597 to 862. Patient was advised to go to ED  In the ED today his Tbili is down to 11.7, alk phos down to 778. WBC normal at 10.2. T 99.5.   Patient endorses constant lower abdominal pain until he stopped insulin injections a few days ago. He says that blood sugars sometimes went too low on insulin. No nausea or vomiting. He has chronic diarrhea darting back to March when he had partial amputation of foot. C-diff negative at that time.    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    Past Surgical History:  Procedure Laterality Date   AMPUTATION Left 11/15/2020   Procedure: LEFT TRANSMETATARSALS AMPUTATION;  Surgeon: Duda, Marcus V, MD;  Location: [facility] OR;  Service: Orthopedics;  Laterality: Left;   APPENDECTOMY     BILIARY STENT PLACEMENT N/A 02/27/2021   Procedure: BILIARY STENT PLACEMENT;  Surgeon: Jacobs, Daniel P, MD;  Location: [facility];  Service: Endoscopy;  Laterality: N/A;   COLONOSCOPY N/A 02/16/2014   Procedure: COLONOSCOPY;  Surgeon: Carl E Gessner, MD;  Location: [facility];  Service: Endoscopy;  Laterality: N/A;   COLONOSCOPY     ENDOSCOPIC RETROGRADE CHOLANGIOPANCREATOGRAPHY (ERCP) WITH PROPOFOL N/A 02/27/2021   Procedure: ENDOSCOPIC RETROGRADE CHOLANGIOPANCREATOGRAPHY (ERCP) WITH PROPOFOL;  Surgeon: Jacobs, Daniel P, MD;  Location: [facility];  Service: Endoscopy;  Laterality: N/A;   EUS N/A 02/27/2021   Procedure: UPPER ENDOSCOPIC ULTRASOUND (EUS) RADIAL;  Surgeon: Jacobs, Daniel P, MD;  Location: [facility];   Service: Endoscopy;  Laterality: N/A;   FINE NEEDLE ASPIRATION N/A 02/27/2021   Procedure: FINE NEEDLE ASPIRATION (FNA) LINEAR;  Surgeon: Jacobs, Daniel P, MD;  Location: [facility];  Service: Endoscopy;  Laterality: N/A;   IR IMAGING GUIDED PORT INSERTION  03/21/2021   SPHINCTEROTOMY  02/27/2021   Procedure: SPHINCTEROTOMY;  Surgeon: Jacobs, Daniel P, MD;  Location: [facility];  Service: Endoscopy;;   TONSILLECTOMY      Prior to Admission medications   Medication Sig Start Date End Date Taking? Authorizing Provider  acetaminophen (TYLENOL) 325 MG tablet Take 2   tablets (650 mg total) by mouth every 6 (six) hours as needed for mild pain (or Fever >/= 101). Patient not taking: Reported on 03/12/2021 11/26/20   Cherene Altes, MD  albuterol (VENTOLIN HFA) 108 (90 Base) MCG/ACT inhaler Inhale 2 puffs into the lungs every 6 (six) hours as needed for wheezing or shortness of breath. 12/05/20   Gerlene Fee, NP  aspirin EC 81 MG tablet Take 1 tablet (81 mg total) by mouth daily. 04/02/17   Funches, Adriana Mccallum, MD  atorvastatin (LIPITOR) 40 MG tablet TAKE 1 TABLET (40 MG TOTAL) BY MOUTH DAILY. Patient taking differently: Take 40 mg by mouth in the morning. 12/05/20   Gerlene Fee, NP  DULoxetine (CYMBALTA) 60 MG capsule TAKE 1 CAPSULE (60 MG TOTAL) BY MOUTH DAILY. Patient taking differently: Take 60 mg by mouth in the morning. 09/16/20 09/16/21  [name], NP  folic acid (FOLVITE) 1 MG tablet Take 1 tablet (1 mg total) by mouth daily. Patient taking differently: Take 1 mg by mouth in the morning. 12/05/20   Gerlene Fee, NP  glipiZIDE (GLUCOTROL) 5 MG tablet Take 5 mg by mouth daily before breakfast. Please fill as a 90 day supply 02/26/21 02/26/22  [provider]  glucose blood (TRUE METRIX BLOOD GLUCOSE TEST) test strip Use as instructed. Check blood glucose level by fingerstick 3-4 times per day.  E11.65 03/04/21   [name], NP  ibuprofen (ADVIL) 800 MG tablet Take 800 mg by  mouth every 8 (eight) hours as needed.    [provider]  insulin glargine (LANTUS) 100 UNIT/ML injection Inject 30 Units into the skin 2 (two) times daily.    [provider]  Insulin Pen Needle (TRUEPLUS PEN NEEDLES) 32G X 4 MM MISC Use as instructed. Inject into the skin twice daily 03/04/21   [name], NP  lidocaine-prilocaine (EMLA) cream Apply 1 application topically as directed as needed. 03/12/21   Orson Slick, MD  lipase/protease/amylase (CREON) 36000 UNITS CPEP capsule Take 2 capsules (72,000 Units total) by mouth 3 (three) times daily with meals. May also take 1 capsule (36,000 Units total) as needed (with snacks). 03/21/21   Orson Slick, MD  losartan (COZAAR) 50 MG tablet TAKE 1 TABLET (50 MG TOTAL) BY MOUTH DAILY. PLEASE FILL AS A 90 DAY SUPPLY 03/05/21 03/05/22  Charlott Rakes, MD  meloxicam (MOBIC) 7.5 MG tablet TAKE 1 TABLET (7.5 MG TOTAL) BY MOUTH DAILY. 03/05/21   Charlott Rakes, MD  mirtazapine (REMERON) 30 MG tablet Take 1 tablet (30 mg total) by mouth at bedtime. 01/07/21 04/07/21  [name], NP  Multiple Vitamins-Minerals (THEREMS-M) TABS Take 1 tablet by mouth in the morning.    [provider]  omeprazole (PRILOSEC) 40 MG capsule TAKE 1 CAPSULE (40 MG TOTAL) BY MOUTH DAILY. Patient taking differently: Take 40 mg by mouth in the morning. 12/05/20 12/05/21  Gerlene Fee, NP  ondansetron (ZOFRAN) 8 MG tablet Take 1 tablet (8 mg total) by mouth every 8 (eight) hours as needed for nausea or vomiting. 03/12/21   Orson Slick, MD  oxyCODONE (OXY IR/ROXICODONE) 5 MG immediate release tablet Take 1-2 tablets (5-10 mg total) by mouth every 6 (six) hours as needed for severe pain. 03/12/21   Orson Slick, MD  prochlorperazine (COMPAZINE) 10 MG tablet Take 1 tablet (10 mg total) by mouth every 6 (six) hours as needed for nausea or vomiting. 03/12/21   Lorenso Courier,  John T IV, MD  TRUEplus Lancets 28G MISC Use as instructed. Check blood glucose  level by fingerstick 3-4 times per day.   E11.65 03/04/21   Fleming, Zelda W, NP    Current Facility-Administered Medications  Medication Dose Route Frequency Provider Last Rate Last Admin   0.9 %  sodium chloride infusion   Intravenous Continuous Agbata, Tochukwu, MD       albuterol (VENTOLIN HFA) 108 (90 Base) MCG/ACT inhaler 2 puff  2 puff Inhalation Q6H PRN Agbata, Tochukwu, MD       [START ON 03/27/2021] DULoxetine (CYMBALTA) DR capsule 60 mg  60 mg Oral q AM Agbata, Tochukwu, MD       [START ON 03/27/2021] folic acid (FOLVITE) tablet 1 mg  1 mg Oral q AM Agbata, Tochukwu, MD       insulin aspart (novoLOG) injection 0-15 Units  0-15 Units Subcutaneous TID WC Agbata, Tochukwu, MD       lipase/protease/amylase (CREON) capsule 36,000 Units  36,000 Units Oral TID AC Agbata, Tochukwu, MD       losartan (COZAAR) tablet 50 mg  50 mg Oral Daily Agbata, Tochukwu, MD       mirtazapine (REMERON) tablet 30 mg  30 mg Oral QHS Agbata, Tochukwu, MD       morphine 2 MG/ML injection 2 mg  2 mg Intravenous Q4H PRN Agbata, Tochukwu, MD       nicotine (NICODERM CQ - dosed in mg/24 hours) patch 21 mg  21 mg Transdermal Daily Agbata, Tochukwu, MD       ondansetron (ZOFRAN) tablet 4 mg  4 mg Oral Q6H PRN Agbata, Tochukwu, MD       Or   ondansetron (ZOFRAN) injection 4 mg  4 mg Intravenous Q6H PRN Agbata, Tochukwu, MD       pantoprazole (PROTONIX) EC tablet 40 mg  40 mg Oral Daily Agbata, Tochukwu, MD       [START ON 03/27/2021] Therems-M TABS 1 tablet  1 tablet Oral q AM Agbata, Tochukwu, MD       Current Outpatient Medications  Medication Sig Dispense Refill   acetaminophen (TYLENOL) 325 MG tablet Take 2 tablets (650 mg total) by mouth every 6 (six) hours as needed for mild pain (or Fever >/= 101). (Patient not taking: Reported on 03/12/2021)     albuterol (VENTOLIN HFA) 108 (90 Base) MCG/ACT inhaler Inhale 2 puffs into the lungs every 6 (six) hours as needed for wheezing or shortness of breath. 18 g 0   aspirin  EC 81 MG tablet Take 1 tablet (81 mg total) by mouth daily. 30 tablet 11   atorvastatin (LIPITOR) 40 MG tablet TAKE 1 TABLET (40 MG TOTAL) BY MOUTH DAILY. (Patient taking differently: Take 40 mg by mouth in the morning.) 30 tablet 0   DULoxetine (CYMBALTA) 60 MG capsule TAKE 1 CAPSULE (60 MG TOTAL) BY MOUTH DAILY. (Patient taking differently: Take 60 mg by mouth in the morning.) 90 capsule 1   folic acid (FOLVITE) 1 MG tablet Take 1 tablet (1 mg total) by mouth daily. (Patient taking differently: Take 1 mg by mouth in the morning.) 30 tablet 0   glipiZIDE (GLUCOTROL) 5 MG tablet Take 5 mg by mouth daily before breakfast. Please fill as a 90 day supply 90 tablet 1   glucose blood (TRUE METRIX BLOOD GLUCOSE TEST) test strip Use as instructed. Check blood glucose level by fingerstick 3-4 times per day.  E11.65 200 each 12   ibuprofen (ADVIL) 800 MG tablet   Take 800 mg by mouth every 8 (eight) hours as needed.     insulin glargine (LANTUS) 100 UNIT/ML injection Inject 30 Units into the skin 2 (two) times daily.     Insulin Pen Needle (TRUEPLUS PEN NEEDLES) 32G X 4 MM MISC Use as instructed. Inject into the skin twice daily 200 each 6   lidocaine-prilocaine (EMLA) cream Apply 1 application topically as directed as needed. 30 g 0   lipase/protease/amylase (CREON) 36000 UNITS CPEP capsule Take 2 capsules (72,000 Units total) by mouth 3 (three) times daily with meals. May also take 1 capsule (36,000 Units total) as needed (with snacks). 56 capsule 0   losartan (COZAAR) 50 MG tablet TAKE 1 TABLET (50 MG TOTAL) BY MOUTH DAILY. PLEASE FILL AS A 90 DAY SUPPLY 30 tablet 1   meloxicam (MOBIC) 7.5 MG tablet TAKE 1 TABLET (7.5 MG TOTAL) BY MOUTH DAILY. 30 tablet 1   mirtazapine (REMERON) 30 MG tablet Take 1 tablet (30 mg total) by mouth at bedtime. 90 tablet 1   Multiple Vitamins-Minerals (THEREMS-M) TABS Take 1 tablet by mouth in the morning.     omeprazole (PRILOSEC) 40 MG capsule TAKE 1 CAPSULE (40 MG TOTAL) BY  MOUTH DAILY. (Patient taking differently: Take 40 mg by mouth in the morning.) 30 capsule 0   ondansetron (ZOFRAN) 8 MG tablet Take 1 tablet (8 mg total) by mouth every 8 (eight) hours as needed for nausea or vomiting. 30 tablet 0   oxyCODONE (OXY IR/ROXICODONE) 5 MG immediate release tablet Take 1-2 tablets (5-10 mg total) by mouth every 6 (six) hours as needed for severe pain. 60 tablet 0   prochlorperazine (COMPAZINE) 10 MG tablet Take 1 tablet (10 mg total) by mouth every 6 (six) hours as needed for nausea or vomiting. 30 tablet 0   TRUEplus Lancets 28G MISC Use as instructed. Check blood glucose level by fingerstick 3-4 times per day.   E11.65 200 each 3    Allergies as of 03/26/2021 - Review Complete 03/26/2021  Allergen Reaction Noted   Bee venom Anaphylaxis 12/16/2017   Lactose intolerance (gi) Other (See Comments) 02/13/2019    Family History  Problem Relation Age of Onset   Breast cancer Mother    Stroke Father    Alcohol abuse Father    Heart disease Maternal Grandfather    Pancreatic cancer Paternal Grandmother    Diabetes Paternal Grandfather    Colon cancer Neg Hx    Stomach cancer Neg Hx    Rectal cancer Neg Hx    Esophageal cancer Neg Hx     Social History   Socioeconomic History   Marital status: Single    Spouse name: Not on file   Number of children: Not on file   Years of education: Not on file   Highest education level: Not on file  Occupational History   Not on file  Tobacco Use   Smoking status: Every Day    Packs/day: 0.50    Years: 35.00    Pack years: 17.50    Types: Cigarettes   Smokeless tobacco: Never  Vaping Use   Vaping Use: Never used  Substance and Sexual Activity   Alcohol use: Not Currently    Comment: previous   Drug use: No   Sexual activity: Not on file  Other Topics Concern   Not on file  Social History Narrative   Not on file   Social Determinants of Health   Financial Resource Strain: High Risk   Difficulty   of Paying  Living Expenses: Very hard  Food Insecurity: Not on file  Transportation Needs: Unmet Transportation Needs   Lack of Transportation (Medical): Yes   Lack of Transportation (Non-Medical): Yes  Physical Activity: Not on file  Stress: Not on file  Social Connections: Not on file  Intimate Partner Violence: Not on file    Review of Systems: All systems reviewed and negative except where noted in HPI.     OBJECTIVE   Physical Exam   Physical Exam: Vital signs in last 24 hours: Temp:  [98 F (36.7 C)-99.5 F (37.5 C)] 99.5 F (37.5 C) (07/27 1050) Pulse Rate:  [79-88] 79 (07/27 1318) Resp:  [16-18] 18 (07/27 1318) BP: (125-156)/(66-86) 125/66 (07/27 1318) SpO2:  [96 %-100 %] 99 % (07/27 1318) Weight:  [66.7 kg] 66.7 kg (07/27 1047)   General:   Alert  male in NAD Psych:  Pleasant, cooperative. Normal mood and affect. Eyes:  Pupils equal,icteric sclera Ears:  Normal auditory acuity. Nose:  No deformity, discharge,  or lesions. Neck:  Supple; no masses Lungs:  Clear throughout to auscultation.   No wheezes, crackles, or rhonchi.  Heart:  Regular rate and rhythm;  no lower extremity edema Abdomen:  Soft, non-distended, nontender, BS active Rectal:  Deferred  Msk:  Symmetrical without gross deformities. . Neurologic:  Alert and  oriented x4;  grossly normal neurologically. Skin:  Intact without significant lesions or rashes.  Filed Weights   03/26/21 1047  Weight: 66.7 kg     Scheduled inpatient medications  [START ON 03/27/2021] DULoxetine  60 mg Oral q AM   [START ON 01/08/2584] folic acid  1 mg Oral q AM   insulin aspart  0-15 Units Subcutaneous TID WC   lipase/protease/amylase  36,000 Units Oral TID AC   losartan  50 mg Oral Daily   mirtazapine  30 mg Oral QHS   nicotine  21 mg Transdermal Daily   pantoprazole  40 mg Oral Daily   [START ON 03/27/2021] Therems-M  1 tablet Oral q AM      Intake/Output from previous day: No intake/output data  recorded. Intake/Output this shift: No intake/output data recorded.   Lab Results: Recent Labs    03/26/21 1048  WBC 10.2  HGB 11.9*  HCT 34.5*  PLT 230   BMET Recent Labs    03/26/21 1048  NA 127*  K 4.6  CL 91*  CO2 26  GLUCOSE 559*  BUN 6  CREATININE 0.46*  CALCIUM 8.8*   LFT Recent Labs    03/24/21 1238 03/26/21 1048  PROT 8.2 7.6  ALBUMIN 3.6 2.5*  AST 166* 142*  ALT 98* 88*  ALKPHOS 862* 778*  BILITOT 14.2* 11.7*  BILIDIR 8.2*  --    PT/INR Recent Labs    03/26/21 1048  LABPROT 15.2  INR 1.2   Hepatitis Panel No results for input(s): HEPBSAG, HCVAB, HEPAIGM, HEPBIGM in the last 72 hours.   . CBC Latest Ref Rng & Units 03/26/2021 03/12/2021 02/26/2021  WBC 4.0 - 10.5 K/uL 10.2 11.9(H) 9.3  Hemoglobin 13.0 - 17.0 g/dL 11.9(L) 12.2(L) 12.9(L)  Hematocrit 39.0 - 52.0 % 34.5(L) 36.8(L) 36.6(L)  Platelets 150 - 400 K/uL 230 275 297.0    . CMP Latest Ref Rng & Units 03/26/2021 03/24/2021 03/21/2021  Glucose 70 - 99 mg/dL 559(HH) - 180(H)  BUN 6 - 20 mg/dL 6 - 10  Creatinine 0.61 - 1.24 mg/dL 0.46(L) - <0.30(L)  Sodium 135 - 145 mmol/L 127(L) - 135  Potassium 3.5 - 5.1 mmol/L 4.6 - 3.6  Chloride 98 - 111 mmol/L 91(L) - 101  CO2 22 - 32 mmol/L 26 - 23  Calcium 8.9 - 10.3 mg/dL 8.8(L) - 9.1  Total Protein 6.5 - 8.1 g/dL 7.6 8.2 7.7  Total Bilirubin 0.3 - 1.2 mg/dL 11.7(H) 14.2(H) 10.0(H)  Alkaline Phos 38 - 126 U/L 778(H) 862(H) 597(H)  AST 15 - 41 U/L 142(H) 166(H) 178(H)  ALT 0 - 44 U/L 88(H) 98(H) 80(H)   Studies/Results: No results found.  Active Problems:   Hyperglycemia due to diabetes mellitus ([place])    Tye Savoy, NP-C @  03/26/2021, 2:10 PM

## 2021-03-26 NOTE — Anesthesia Preprocedure Evaluation (Addendum)
Anesthesia Evaluation  Patient identified by MRN, date of birth, ID band Patient awake    Reviewed: Allergy & Precautions, NPO status , Patient's Chart, lab work & pertinent test results  Airway Mallampati: II  TM Distance: >3 FB Neck ROM: Full    Dental no notable dental hx. (+) Partial Upper, Partial Lower, Dental Advisory Given,    Pulmonary Current Smoker and Patient abstained from smoking.,    Pulmonary exam normal breath sounds clear to auscultation       Cardiovascular hypertension, Normal cardiovascular exam Rhythm:Regular Rate:Normal  10/30/2020 TTE  1. Left ventricular ejection fraction, by estimation, is 60 to 65%. The  left ventricle has normal function. The left ventricle has no regional  wall motion abnormalities. There is mild left ventricular hypertrophy.  Left ventricular diastolic parameters  were normal.  2. Right ventricular systolic function is normal. The right ventricular  size is normal. Tricuspid regurgitation signal is inadequate for assessing  PA pressure.  3. The mitral valve is normal in structure. Trivial mitral valve  regurgitation. No evidence of mitral stenosis.  4. The aortic valve is tricuspid. Aortic valve regurgitation is not  visualized. No aortic stenosis is present.  5. The inferior vena cava is dilated in size with <50% respiratory  variability, suggesting right atrial pressure of 15 mmHg.  6. No evidence for endocarditis was seen.    Neuro/Psych negative neurological ROS     GI/Hepatic hiatal hernia, GERD  ,(+) Hepatitis -, C  Endo/Other  diabetes  Renal/GU      Musculoskeletal negative musculoskeletal ROS (+)   Abdominal   Peds  Hematology   Anesthesia Other Findings S/P TMA L foot  Reproductive/Obstetrics                           Anesthesia Physical Anesthesia Plan  ASA: 3  Anesthesia Plan: General   Post-op Pain Management:     Induction: Intravenous  PONV Risk Score and Plan: Treatment may vary due to age or medical condition, Ondansetron and Dexamethasone  Airway Management Planned: Oral ETT  Additional Equipment:   Intra-op Plan:   Post-operative Plan: Extubation in OR  Informed Consent: I have reviewed the patients History and Physical, chart, labs and discussed the procedure including the risks, benefits and alternatives for the proposed anesthesia with the patient or authorized representative who has indicated his/her understanding and acceptance.    Continue DNR.   Dental advisory given  Plan Discussed with: CRNA  Anesthesia Plan Comments: (ERCP for Biliary obstruction)      Anesthesia Quick Evaluation

## 2021-03-26 NOTE — ED Notes (Addendum)
Pt upstairs to room 1409 via w/c

## 2021-03-26 NOTE — H&P (Signed)
History and Physical    [name] [name] [id number] DOB: 18-Nov-1960 DOA: 03/26/2021  PCP: [name], NP   Patient coming from: Home  I have personally briefly reviewed patient's old medical records in [place]  Chief Complaint:   HPI: [name] [name] is a 60 y.o. male with medical history significant for adenocarcinoma of the pancreas, stage IIb, diabetes mellitus, GERD and Barrett's esophagus who presents to the ER for evaluation of worsening jaundice. Patient was recently diagnosed with pancreatic cancer and is status post recent ERCP with placement of a nonmetallic biliary stent.  He is scheduled to start chemotherapy on 03/27/21. Post insertion of the biliary stent which was done on 02/27/21 patient had blood work which showed increased levels of his total bilirubin and he also noted worsening jaundice.  He was referred to the ER by his gastroenterologist for admission for a repeat ERCP to evaluate the stent. He continues to have diarrhea which he states is clay colored. He denies having any fever or chills, no cough, no chest pain, no shortness of breath, no dizziness, no lightheadedness, no leg swelling, no palpitations, no diaphoresis, no urinary symptoms, no headache. Labs show sodium 127, potassium 4.6, chloride 91, bicarb 26, glucose 559, BUN 6, creatinine 0.46, calcium 8.8, alkaline phosphatase 778, albumin 2.5, lipase 14, AST 142, ALT 88, total protein 7.6, total bilirubin 11.7, white count 10.2, hemoglobin 11.9, hematocrit 34.5, MCV 93.2, RDW 13.7, platelet count 230, PT 15.2, INR 1.2 Respiratory viral panel is negative  ED Course: Patient is a 60 year old male who was recently diagnosed with adenocarcinoma of the pancreas and is status post ERCP which was done on February 27, 2021 with placement of a normal metallic biliary stent. He was referred to the ER for evaluation of worsening total bilirubin levels as well as worsening jaundice. Labs show hyperglycemia as  well. He will be admitted to the hospital for further evaluation.    Review of Systems: As per HPI otherwise all other systems reviewed and negative.    Past Medical History:  Diagnosis Date   Barrett's esophagus dx 2016   Bronchitis    Chronic hepatitis C without hepatic coma ([place]) 10/31/2014   Depression    Diabetes ([place])    ED (erectile dysfunction)    GERD (gastroesophageal reflux disease)    Hepatitis C    Hiatal hernia 10/2014   3cm   Neuropathy    S/P transmetatarsal amputation of foot, left ([place]) 11/28/2020    Past Surgical History:  Procedure Laterality Date   AMPUTATION Left 11/15/2020   Procedure: LEFT TRANSMETATARSALS AMPUTATION;  Surgeon: Newt Minion, MD;  Location: [facility];  Service: Orthopedics;  Laterality: Left;   APPENDECTOMY     BILIARY STENT PLACEMENT N/A 02/27/2021   Procedure: BILIARY STENT PLACEMENT;  Surgeon: Milus Banister, MD;  Location: [facility];  Service: Endoscopy;  Laterality: N/A;   COLONOSCOPY N/A 02/16/2014   Procedure: COLONOSCOPY;  Surgeon: Gatha Mayer, MD;  Location: [facility];  Service: Endoscopy;  Laterality: N/A;   COLONOSCOPY     ENDOSCOPIC RETROGRADE CHOLANGIOPANCREATOGRAPHY (ERCP) WITH PROPOFOL N/A 02/27/2021   Procedure: ENDOSCOPIC RETROGRADE CHOLANGIOPANCREATOGRAPHY (ERCP) WITH PROPOFOL;  Surgeon: Milus Banister, MD;  Location: [facility];  Service: Endoscopy;  Laterality: N/A;   EUS N/A 02/27/2021   Procedure: UPPER ENDOSCOPIC ULTRASOUND (EUS) RADIAL;  Surgeon: Milus Banister, MD;  Location: [facility];  Service: Endoscopy;  Laterality: N/A;   FINE NEEDLE ASPIRATION N/A 02/27/2021   Procedure:  FINE NEEDLE ASPIRATION (FNA) LINEAR;  Surgeon: Milus Banister, MD;  Location: [facility];  Service: Endoscopy;  Laterality: N/A;   IR IMAGING GUIDED PORT INSERTION  03/21/2021   SPHINCTEROTOMY  02/27/2021   Procedure: SPHINCTEROTOMY;  Surgeon: Milus Banister, MD;  Location: [facility];  Service: Endoscopy;;    TONSILLECTOMY       reports that he has been smoking cigarettes. He has a 17.50 pack-year smoking history. He has never used smokeless tobacco. He reports previous alcohol use. He reports that he does not use drugs.  Allergies  Allergen Reactions   Bee Venom Anaphylaxis   Lactose Intolerance (Gi) Other (See Comments)    GI upset    Family History  Problem Relation Age of Onset   Breast cancer Mother    Stroke Father    Alcohol abuse Father    Heart disease Maternal Grandfather    Pancreatic cancer Paternal Grandmother    Diabetes Paternal Grandfather    Colon cancer Neg Hx    Stomach cancer Neg Hx    Rectal cancer Neg Hx    Esophageal cancer Neg Hx       Prior to Admission medications   Medication Sig Start Date End Date Taking? Authorizing Provider  acetaminophen (TYLENOL) 325 MG tablet Take 2 tablets (650 mg total) by mouth every 6 (six) hours as needed for mild pain (or Fever >/= 101). Patient not taking: Reported on 03/12/2021 11/26/20   Cherene Altes, MD  albuterol (VENTOLIN HFA) 108 (90 Base) MCG/ACT inhaler Inhale 2 puffs into the lungs every 6 (six) hours as needed for wheezing or shortness of breath. 12/05/20   Gerlene Fee, NP  aspirin EC 81 MG tablet Take 1 tablet (81 mg total) by mouth daily. 04/02/17   Funches, Adriana Mccallum, MD  atorvastatin (LIPITOR) 40 MG tablet TAKE 1 TABLET (40 MG TOTAL) BY MOUTH DAILY. Patient taking differently: Take 40 mg by mouth in the morning. 12/05/20   Gerlene Fee, NP  DULoxetine (CYMBALTA) 60 MG capsule TAKE 1 CAPSULE (60 MG TOTAL) BY MOUTH DAILY. Patient taking differently: Take 60 mg by mouth in the morning. 09/16/20 09/16/21  [name], NP  folic acid (FOLVITE) 1 MG tablet Take 1 tablet (1 mg total) by mouth daily. Patient taking differently: Take 1 mg by mouth in the morning. 12/05/20   Gerlene Fee, NP  glipiZIDE (GLUCOTROL) 5 MG tablet Take 5 mg by mouth daily before breakfast. Please fill as a 90 day supply 02/26/21  02/26/22  [provider]  glucose blood (TRUE METRIX BLOOD GLUCOSE TEST) test strip Use as instructed. Check blood glucose level by fingerstick 3-4 times per day.  E11.65 03/04/21   [name], NP  ibuprofen (ADVIL) 800 MG tablet Take 800 mg by mouth every 8 (eight) hours as needed.    [provider]  insulin glargine (LANTUS) 100 UNIT/ML injection Inject 30 Units into the skin 2 (two) times daily.    [provider]  Insulin Pen Needle (TRUEPLUS PEN NEEDLES) 32G X 4 MM MISC Use as instructed. Inject into the skin twice daily 03/04/21   [name], NP  lidocaine-prilocaine (EMLA) cream Apply 1 application topically as directed as needed. 03/12/21   Orson Slick, MD  lipase/protease/amylase (CREON) 36000 UNITS CPEP capsule Take 2 capsules (72,000 Units total) by mouth 3 (three) times daily with meals. May also take 1 capsule (36,000 Units total) as needed (with snacks). 03/21/21   Lorenso Courier,  Madie Reno IV, MD  losartan (COZAAR) 50 MG tablet TAKE 1 TABLET (50 MG TOTAL) BY MOUTH DAILY. PLEASE FILL AS A 90 DAY SUPPLY 03/05/21 03/05/22  Charlott Rakes, MD  meloxicam (MOBIC) 7.5 MG tablet TAKE 1 TABLET (7.5 MG TOTAL) BY MOUTH DAILY. 03/05/21   Charlott Rakes, MD  mirtazapine (REMERON) 30 MG tablet Take 1 tablet (30 mg total) by mouth at bedtime. 01/07/21 04/07/21  [name], NP  Multiple Vitamins-Minerals (THEREMS-M) TABS Take 1 tablet by mouth in the morning.    [provider]  omeprazole (PRILOSEC) 40 MG capsule TAKE 1 CAPSULE (40 MG TOTAL) BY MOUTH DAILY. Patient taking differently: Take 40 mg by mouth in the morning. 12/05/20 12/05/21  Gerlene Fee, NP  ondansetron (ZOFRAN) 8 MG tablet Take 1 tablet (8 mg total) by mouth every 8 (eight) hours as needed for nausea or vomiting. 03/12/21   Orson Slick, MD  oxyCODONE (OXY IR/ROXICODONE) 5 MG immediate release tablet Take 1-2 tablets (5-10 mg total) by mouth every 6 (six) hours as needed for severe pain. 03/12/21    Orson Slick, MD  prochlorperazine (COMPAZINE) 10 MG tablet Take 1 tablet (10 mg total) by mouth every 6 (six) hours as needed for nausea or vomiting. 03/12/21   Orson Slick, MD  TRUEplus Lancets 28G MISC Use as instructed. Check blood glucose level by fingerstick 3-4 times per day.   E11.65 03/04/21   [name], NP    Physical Exam: Vitals:   03/26/21 1047 03/26/21 1050 03/26/21 1130 03/26/21 1318  BP:  (!) 156/86 138/76 125/66  Pulse:  81 81 79  Resp:  '18 16 18  '$ Temp:  99.5 F (37.5 C)    TempSrc:  Oral    SpO2:  98% 96% 99%  Weight: 66.7 kg     Height: '5\' 11"'$  (1.803 m)        Vitals:   03/26/21 1047 03/26/21 1050 03/26/21 1130 03/26/21 1318  BP:  (!) 156/86 138/76 125/66  Pulse:  81 81 79  Resp:  '18 16 18  '$ Temp:  99.5 F (37.5 C)    TempSrc:  Oral    SpO2:  98% 96% 99%  Weight: 66.7 kg     Height: '5\' 11"'$  (1.803 m)         Constitutional: Alert and oriented x 3 . Not in any apparent distress.  Scleral icterus, yellowish discoloration of the skin HEENT:      Head: Normocephalic and atraumatic.         Eyes: PERLA, EOMI, Conjunctivae pallor. Sclera icteric.       Mouth/Throat: Mucous membranes are moist.       Neck: Supple with no signs of meningismus. Cardiovascular: Regular rate and rhythm. No murmurs, gallops, or rubs. 2+ symmetrical distal pulses are present . No JVD. No LE edema Respiratory: Respiratory effort normal .Lungs sounds clear bilaterally. No wheezes, crackles, or rhonchi.  Gastrointestinal: Soft, non tender, and non distended with positive bowel sounds.  Genitourinary: No CVA tenderness. Musculoskeletal: Nontender with normal range of motion in all extremities. No cyanosis, or erythema of extremities. Neurologic:  Face is symmetric. Moving all extremities. No gross focal neurologic deficits . Skin: Skin is warm, dry.  No rash or ulcers Psychiatric: Mood and affect are normal    Labs on Admission: I have personally reviewed following  labs and imaging studies  CBC: Recent Labs  Lab 03/26/21 1048  WBC 10.2  NEUTROABS 7.5  HGB 11.9*  HCT 34.5*  MCV 93.2  PLT 123456   Basic Metabolic Panel: Recent Labs  Lab 03/21/21 0840 03/26/21 1048  NA 135 127*  K 3.6 4.6  CL 101 91*  CO2 23 26  GLUCOSE 180* 559*  BUN 10 6  CREATININE <0.30* 0.46*  CALCIUM 9.1 8.8*   GFR: Estimated Creatinine Clearance: 95 mL/min (A) (by C-G formula based on SCr of 0.46 mg/dL (L)). Liver Function Tests: Recent Labs  Lab 03/21/21 0840 03/24/21 1238 03/26/21 1048  AST 178* 166* 142*  ALT 80* 98* 88*  ALKPHOS 597* 862* 778*  BILITOT 10.0* 14.2* 11.7*  PROT 7.7 8.2 7.6  ALBUMIN 2.9* 3.6 2.5*   Recent Labs  Lab 03/26/21 1048  LIPASE 14   No results for input(s): AMMONIA in the last 168 hours. Coagulation Profile: Recent Labs  Lab 03/26/21 1048  INR 1.2   Cardiac Enzymes: No results for input(s): CKTOTAL, CKMB, CKMBINDEX, TROPONINI in the last 168 hours. BNP (last 3 results) No results for input(s): PROBNP in the last 8760 hours. HbA1C: No results for input(s): HGBA1C in the last 72 hours. CBG: Recent Labs  Lab 03/21/21 0823 03/21/21 0905 03/26/21 1311 03/26/21 1408  GLUCAP 68* 111* 487* 294*   Lipid Profile: No results for input(s): CHOL, HDL, LDLCALC, TRIG, CHOLHDL, LDLDIRECT in the last 72 hours. Thyroid Function Tests: No results for input(s): TSH, T4TOTAL, FREET4, T3FREE, THYROIDAB in the last 72 hours. Anemia Panel: No results for input(s): VITAMINB12, FOLATE, FERRITIN, TIBC, IRON, RETICCTPCT in the last 72 hours. Urine analysis:    Component Value Date/Time   COLORURINE YELLOW 10/28/2020 1014   APPEARANCEUR CLEAR 10/28/2020 1014   LABSPEC 1.026 10/28/2020 1014   PHURINE 5.0 10/28/2020 1014   GLUCOSEU >=500 (A) 10/28/2020 1014   HGBUR SMALL (A) 10/28/2020 1014   BILIRUBINUR NEGATIVE 10/28/2020 1014   BILIRUBINUR neg 08/03/2017 0959   KETONESUR 5 (A) 10/28/2020 1014   PROTEINUR NEGATIVE 10/28/2020  1014   UROBILINOGEN 0.2 08/03/2017 0959   UROBILINOGEN 0.2 09/28/2013 1520   NITRITE NEGATIVE 10/28/2020 [place] 10/28/2020 1014    Radiological Exams on Admission: No results found.   Assessment/Plan Principal Problem:   Hyperglycemia due to diabetes mellitus (HCC) Active Problems:   Tobacco use disorder   Chronic hepatitis C without hepatic coma (HCC)   Hyponatremia   Depression   Pancreatic cancer (HCC)   Malignant neoplasm of head of pancreas (HCC)     Hyperglycemia due to diabetes mellitus Secondary to medication noncompliance Patient states that he stopped taking his Lantus for the last couple of days Hold glipizide IV fluid hydration Glycemic control with sliding scale insulin Patient has been counseled on the need to be compliant with prescribed medications.     Hyponatremia Secondary to hyperglycemia Expect improvement in patient's sodium levels with resolution of hypoglycemia    Malignant neoplasm of the head of the pancreas For obstruction of the common bile duct resulting in jaundice and elevated total bilirubin levels Patient is status post ERCP with placement of a metal metallic biliary stent Will request GI consult for evaluation of worsening jaundice     Nicotine dependence Smoking cessation has been discussed with patient in detail We will place patient on a nicotine transdermal patch 21 mg daily     Hypertension Continue losartan    Depression Continue Cymbalta and mirtazapine    DVT prophylaxis: SCD Code Status: full code  Family Communication: Greater than 50% of time was spent  discussing patient's condition and plan of care with him and his mother at the bedside.  All questions and concerns have been addressed.  He lists his mother Ms. Rosana Fret as his healthcare power of attorney.  CODE STATUS was discussed and he is a full code. Disposition Plan: Back to previous home environment Consults called:  Gastroenterology Status: At the time of admission, it appears that the appropriate admission status for this patient is inpatient. This is judged to be reasonable and necessary in order to provide the required intensity of service to ensure the patient's safety given the presenting symptoms, physical exam findings and initial radiographic and laboratory data in the context of their comorbid conditions. Patient requires inpatient status due to high intensity of service, high risk for further deterioration and high frequency of surveillance.    Collier Bullock MD [facility]     03/26/2021, 2:29 PM

## 2021-03-26 NOTE — ED Provider Notes (Signed)
[facility] DEPT Provider Note   CSN: [id number] Arrival date & time: 03/26/21  1027     History Chief Complaint  Patient presents with   Abnormal Lab    [name] [name] is a 60 y.o. male.  HPI     [name] with a history of diabetes, pancreatic cancer, hypertension, cirrhosis, chronic hepatitis C  He underwent an ERCP for biliary obstruction and his bilirubin was downtrending from 25.4 prior to the procedure to 6.9, he had a port placed with plan for chemotherapy.  He has been having lab work done as an outpatient with gastroenterology with continued rise in his bilirubin and was sent by GI for admission and urgent evaluation.   Patient reports that he believes he is here for evaluation of infection, is aware of his elevated bilirubin and possibility that his stent might be obstructed.  Reports typically has temperatures run on the low side, however they have been around 99 over the last few days.  No known fevers, no nausea or vomiting.  Reports that his abdominal pain is actually improved from previously, noting that it improved after he stopped taking insulin.  Reports a dull cramping pain which is chronic.  His abdominal pain had initially started 2 months ago.  Reports he has had chronic loose stool, and that the loose stool has been worsening.  No black or bloody stool.  [facility]es [name], Dr. Havery [name] told him to present to the emergency department.  Past Medical History:  Diagnosis Date   Barrett's esophagus dx 2016   Bronchitis    Chronic hepatitis C without hepatic coma ([place]) 10/31/2014   Depression    Diabetes ([place])    ED (erectile dysfunction)    GERD (gastroesophageal reflux disease)    Hepatitis C    Hiatal hernia 10/2014   3cm   Neuropathy    S/P transmetatarsal amputation of foot, left ([place]) 11/28/2020    Patient Active Problem List   Diagnosis Date Noted   Pancreatic cancer ([place]) 03/12/2021   Malignant neoplasm of  head of pancreas ([place]) 03/12/2021   Jaundice 02/26/2021   Loss of weight 02/26/2021   S/P transmetatarsal amputation of foot, left ([place]) 11/28/2020   Hypertension associated with type 2 diabetes mellitus ([place]) 11/28/2020   Type 2 diabetes mellitus with diabetic neuropathy, unspecified ([place]) 11/28/2020   Hyperlipidemia associated with type 2 diabetes mellitus ([place]) 11/28/2020   GERD without esophagitis 11/28/2020   Major depression, recurrent, chronic ([place]) 11/28/2020   Aortic atherosclerosis ([place]) 11/28/2020   Diabetic neuropathy ([place]) 11/28/2020   Osteomyelitis ([place]) 11/14/2020   Sepsis ([place]) 10/28/2020   Community acquired pneumonia of right lower lobe of lung    Elevated LFTs    Hypophosphatemia    Acute metabolic encephalopathy    Acute maxillary sinusitis    Depression    Physical deconditioning    Pneumonia 05/14/2020   DTs (delirium tremens) ([place]) 05/13/2020   Hyponatremia 05/13/2020   Hypokalemia 05/13/2020   SIRS (systemic inflammatory response syndrome) ([place]) 05/13/2020   Sepsis with acute hypoxic respiratory failure without septic shock ([place])    Alcohol abuse with alcohol-induced mood disorder ([place]) 12/17/2017   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/23/2017   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   Essential hypertension 04/02/2017   Hypogonadism male 08/13/2016   ETOH abuse 06/22/2016   Low serum testosterone level 01/08/2016   Type 2 diabetes mellitus with other specified complication ([place]) 82/50/5397   Erectile dysfunction 09/12/2015   Hepatic  cirrhosis ([place]) 01/10/2015   Chronic hepatitis C without hepatic coma ([place]) 10/31/2014   Tobacco use disorder 08/03/2013   Diabetes type 2, controlled ([place]) 04/27/2013    Past Surgical History:  Procedure Laterality Date   AMPUTATION Left 11/15/2020   Procedure: LEFT TRANSMETATARSALS AMPUTATION;  Surgeon: Newt Minion, MD;  Location: [facility];  Service: Orthopedics;  Laterality: Left;   APPENDECTOMY      BILIARY STENT PLACEMENT N/A 02/27/2021   Procedure: BILIARY STENT PLACEMENT;  Surgeon: Milus Banister, MD;  [facility]: [facility];  Service: Endoscopy;  Laterality: N/A;   COLONOSCOPY N/A 02/16/2014   Procedure: COLONOSCOPY;  Surgeon: Gatha Mayer, MD;  [facility];  Service: Endoscopy;  Laterality: N/A;   COLONOSCOPY     ENDOSCOPIC RETROGRADE CHOLANGIOPANCREATOGRAPHY (ERCP) WITH PROPOFOL N/A 02/27/2021   Procedure: ENDOSCOPIC RETROGRADE CHOLANGIOPANCREATOGRAPHY (ERCP) WITH PROPOFOL;  Surgeon: Milus Banister, MD;  [facility];  Service: Endoscopy;  Laterality: N/A;   EUS N/A 02/27/2021   Procedure: UPPER ENDOSCOPIC ULTRASOUND (EUS) RADIAL;  Surgeon: Milus Banister, MD;  [facility];  Service: Endoscopy;  Laterality: N/A;   FINE NEEDLE ASPIRATION N/A 02/27/2021   Procedure: FINE NEEDLE ASPIRATION (FNA) LINEAR;  Surgeon: Milus Banister, MD;  [facility];  Service: Endoscopy;  Laterality: N/A;   IR IMAGING GUIDED PORT INSERTION  03/21/2021   SPHINCTEROTOMY  02/27/2021   Procedure: Joan Mayans;  Surgeon: Milus Banister, MD;  [facility];  Service: Endoscopy;;   TONSILLECTOMY         Family History  Problem Relation Age of Onset   Breast cancer Mother    Stroke Father    Alcohol abuse Father    Heart disease Maternal Grandfather    Pancreatic cancer Paternal Grandmother    Diabetes Paternal Grandfather    Colon cancer Neg Hx    Stomach cancer Neg Hx    Rectal cancer Neg Hx    Esophageal cancer Neg Hx     Social History   Tobacco Use   Smoking status: Every Day    Packs/day: 0.50    Years: 35.00    Pack years: 17.50    Types: Cigarettes   Smokeless tobacco: Never  Vaping Use   Vaping Use: Never used  Substance Use Topics   Alcohol use: Not Currently    Comment: previous   Drug use: No    Home Medications Prior to Admission medications   Medication Sig Start Date End Date Taking? Authorizing Provider   acetaminophen (TYLENOL) 325 MG tablet Take 2 tablets (650 mg total) by mouth every 6 (six) hours as needed for mild pain (or Fever >/= 101). Patient not taking: Reported on 03/12/2021 11/26/20   Cherene Altes, MD  albuterol (VENTOLIN HFA) 108 (90 Base) MCG/ACT inhaler Inhale 2 puffs into the lungs every 6 (six) hours as needed for wheezing or shortness of breath. 12/05/20   Gerlene Fee, NP  aspirin EC 81 MG tablet Take 1 tablet (81 mg total) by mouth daily. 04/02/17   Funches, Adriana Mccallum, MD  atorvastatin (LIPITOR) 40 MG tablet TAKE 1 TABLET (40 MG TOTAL) BY MOUTH DAILY. Patient taking differently: Take 40 mg by mouth in the morning. 12/05/20   Gerlene Fee, NP  DULoxetine (CYMBALTA) 60 MG capsule TAKE 1 CAPSULE (60 MG TOTAL) BY MOUTH DAILY. Patient taking differently: Take 60 mg by mouth in the morning. 09/16/20 09/16/21  [name], NP  folic acid (FOLVITE) 1 MG tablet Take 1  tablet (1 mg total) by mouth daily. Patient taking differently: Take 1 mg by mouth in the morning. 12/05/20   Gerlene Fee, NP  glipiZIDE (GLUCOTROL) 5 MG tablet Take 5 mg by mouth daily before breakfast. Please fill as a 90 day supply 02/26/21 02/26/22  [provider]  glucose blood (TRUE METRIX BLOOD GLUCOSE TEST) test strip Use as instructed. Check blood glucose level by fingerstick 3-4 times per day.  E11.65 03/04/21   [name], NP  ibuprofen (ADVIL) 800 MG tablet Take 800 mg by mouth every 8 (eight) hours as needed.    [provider]  insulin glargine (LANTUS) 100 UNIT/ML injection Inject 30 Units into the skin 2 (two) times daily.    [provider]  Insulin Pen Needle (TRUEPLUS PEN NEEDLES) 32G X 4 MM MISC Use as instructed. Inject into the skin twice daily 03/04/21   [name], NP  lidocaine-prilocaine (EMLA) cream Apply 1 application topically as directed as needed. 03/12/21   Orson Slick, MD  lipase/protease/amylase (CREON) 36000 UNITS CPEP capsule Take 2 capsules  (72,000 Units total) by mouth 3 (three) times daily with meals. May also take 1 capsule (36,000 Units total) as needed (with snacks). 03/21/21   Orson Slick, MD  losartan (COZAAR) 50 MG tablet TAKE 1 TABLET (50 MG TOTAL) BY MOUTH DAILY. PLEASE FILL AS A 90 DAY SUPPLY 03/05/21 03/05/22  Charlott Rakes, MD  meloxicam (MOBIC) 7.5 MG tablet TAKE 1 TABLET (7.5 MG TOTAL) BY MOUTH DAILY. 03/05/21   Charlott Rakes, MD  mirtazapine (REMERON) 30 MG tablet Take 1 tablet (30 mg total) by mouth at bedtime. 01/07/21 04/07/21  [name], NP  Multiple Vitamins-Minerals (THEREMS-M) TABS Take 1 tablet by mouth in the morning.    [provider]  omeprazole (PRILOSEC) 40 MG capsule TAKE 1 CAPSULE (40 MG TOTAL) BY MOUTH DAILY. Patient taking differently: Take 40 mg by mouth in the morning. 12/05/20 12/05/21  Gerlene Fee, NP  ondansetron (ZOFRAN) 8 MG tablet Take 1 tablet (8 mg total) by mouth every 8 (eight) hours as needed for nausea or vomiting. 03/12/21   Orson Slick, MD  oxyCODONE (OXY IR/ROXICODONE) 5 MG immediate release tablet Take 1-2 tablets (5-10 mg total) by mouth every 6 (six) hours as needed for severe pain. 03/12/21   Orson Slick, MD  prochlorperazine (COMPAZINE) 10 MG tablet Take 1 tablet (10 mg total) by mouth every 6 (six) hours as needed for nausea or vomiting. 03/12/21   Orson Slick, MD  TRUEplus Lancets 28G MISC Use as instructed. Check blood glucose level by fingerstick 3-4 times per day.   E11.65 03/04/21   [name], NP    Allergies    Bee venom and Lactose intolerance (gi)  Review of Systems   Review of Systems  Constitutional:  Positive for unexpected weight change. Negative for fever.  Respiratory:  Negative for shortness of breath.   Cardiovascular:  Negative for chest pain.  Gastrointestinal:  Positive for diarrhea. Negative for abdominal pain, nausea and vomiting.  Genitourinary:  Negative for difficulty urinating.  Skin:  Negative for rash.   Neurological:  Negative for syncope.   Physical Exam Updated Vital Signs BP 138/76   Pulse 81   Temp 99.5 F (37.5 C) (Oral)   Resp 16   Ht '5\' 11"'  (1.803 m)   Wt 66.7 kg   SpO2 96%   BMI 20.50 kg/m  Physical Exam Vitals and nursing note reviewed.  Constitutional:      General: He is not in acute distress.    Appearance: Normal appearance. He is not ill-appearing, toxic-appearing or diaphoretic.  HENT:     Head: Normocephalic.  Eyes:     Conjunctiva/sclera: Conjunctivae normal.  Cardiovascular:     Rate and Rhythm: Normal rate and regular rhythm.     Pulses: Normal pulses.  Pulmonary:     Effort: Pulmonary effort is normal. No respiratory distress.  Abdominal:     General: There is no distension.     Palpations: There is mass.     Tenderness: There is no abdominal tenderness.  Musculoskeletal:        General: No deformity or signs of injury.     Cervical back: No rigidity.  Skin:    General: Skin is warm and dry.     Coloration: Skin is not jaundiced or pale.  Neurological:     General: No focal deficit present.     Mental Status: He is alert and oriented to person, place, and time.    ED Results / Procedures / Treatments   Labs (all labs ordered are listed, but only abnormal results are displayed) Labs Reviewed  CBC WITH DIFFERENTIAL/PLATELET - Abnormal; Notable for the following components:      Result Value   RBC 3.70 (*)    Hemoglobin 11.9 (*)    HCT 34.5 (*)    All other components within normal limits  COMPREHENSIVE METABOLIC PANEL - Abnormal; Notable for the following components:   Sodium 127 (*)    Chloride 91 (*)    Glucose, Bld 559 (*)    Creatinine, Ser 0.46 (*)    Calcium 8.8 (*)    Albumin 2.5 (*)    AST 142 (*)    ALT 88 (*)    Alkaline Phosphatase 778 (*)    Total Bilirubin 11.7 (*)    All other components within normal limits  RESP PANEL BY RT-PCR (FLU A&B, COVID) ARPGX2  LIPASE, BLOOD  PROTIME-INR    EKG None  Radiology No  results found.  Procedures Procedures   Medications Ordered in ED Medications  sodium chloride 0.9 % bolus 1,000 mL (has no administration in time range)  insulin aspart (novoLOG) injection 8 Units (has no administration in time range)    ED Course  I have reviewed the triage vital signs and the nursing notes.  Pertinent labs & imaging results that were available during my care of the patient were reviewed by me and considered in my medical decision making (see chart for details).    MDM Rules/Calculators/A&P                            [name] with a history of diabetes, pancreatic cancer, hypertension, cirrhosis, chronic hepatitis C, had ERCP with sphincterotomy and biliary stent placed 02/27/2021, and has had increasing bilirubin since this time and is scheduled to start chemotherapy tomorrow and was sent to the emergency department by Dr. Frances Furbish gastroenterology with concern for biliary obstruction.  Labs obtained today show glucose of 559, elevation of AST to 142, ALT 88, alk phos 778, bilirubin of 11.7.  Given insulin and IV fluids.  Consulted [facility], spoke iwht Tye Savoy NP plan for admission and evaluation.      Final Clinical Impression(s) / ED Diagnoses Final diagnoses:  Elevated bilirubin  Biliary obstruction  Hyperglycemia  Rx / DC Orders ED Discharge Orders     None        Gareth Morgan, MD 03/26/21 1308

## 2021-03-27 ENCOUNTER — Encounter: Payer: Self-pay | Admitting: Nutrition

## 2021-03-27 ENCOUNTER — Inpatient Hospital Stay (HOSPITAL_COMMUNITY): Payer: Medicaid Other

## 2021-03-27 ENCOUNTER — Inpatient Hospital Stay (HOSPITAL_COMMUNITY): Payer: Medicaid Other | Admitting: Anesthesiology

## 2021-03-27 ENCOUNTER — Encounter (HOSPITAL_COMMUNITY): Payer: Self-pay | Admitting: Internal Medicine

## 2021-03-27 ENCOUNTER — Encounter (HOSPITAL_COMMUNITY)
Admission: EM | Disposition: A | Discharge status: Home / Self Care | Payer: Self-pay | Source: Home / Self Care | Attending: Internal Medicine

## 2021-03-27 ENCOUNTER — Ambulatory Visit: Payer: Self-pay | Admitting: Hematology and Oncology

## 2021-03-27 ENCOUNTER — Ambulatory Visit: Payer: Self-pay

## 2021-03-27 ENCOUNTER — Other Ambulatory Visit: Payer: Self-pay

## 2021-03-27 DIAGNOSIS — K831 Obstruction of bile duct: Secondary | ICD-10-CM

## 2021-03-27 DIAGNOSIS — R17 Unspecified jaundice: Secondary | ICD-10-CM

## 2021-03-27 DIAGNOSIS — Z4659 Encounter for fitting and adjustment of other gastrointestinal appliance and device: Secondary | ICD-10-CM

## 2021-03-27 DIAGNOSIS — E440 Moderate protein-calorie malnutrition: Secondary | ICD-10-CM | POA: Insufficient documentation

## 2021-03-27 DIAGNOSIS — B182 Chronic viral hepatitis C: Secondary | ICD-10-CM

## 2021-03-27 HISTORY — PX: STENT REMOVAL: SHX6421

## 2021-03-27 HISTORY — PX: BIOPSY: SHX5522

## 2021-03-27 HISTORY — PX: ERCP: SHX5425

## 2021-03-27 HISTORY — PX: BILIARY STENT PLACEMENT: SHX5538

## 2021-03-27 LAB — COMPREHENSIVE METABOLIC PANEL
ALT: 81 U/L — ABNORMAL HIGH (ref 0–44)
AST: 143 U/L — ABNORMAL HIGH (ref 15–41)
Albumin: 2.4 g/dL — ABNORMAL LOW (ref 3.5–5.0)
Alkaline Phosphatase: 709 U/L — ABNORMAL HIGH (ref 38–126)
Anion gap: 7 (ref 5–15)
BUN: 10 mg/dL (ref 6–20)
CO2: 25 mmol/L (ref 22–32)
Calcium: 8.4 mg/dL — ABNORMAL LOW (ref 8.9–10.3)
Chloride: 98 mmol/L (ref 98–111)
Creatinine, Ser: 0.33 mg/dL — ABNORMAL LOW (ref 0.61–1.24)
GFR, Estimated: >60 mL/min (ref >60)
Glucose, Bld: 296 mg/dL — ABNORMAL HIGH (ref 70–99)
Potassium: 3.8 mmol/L (ref 3.5–5.1)
Sodium: 130 mmol/L — ABNORMAL LOW (ref 135–145)
Total Bilirubin: 12 mg/dL — ABNORMAL HIGH (ref 0.3–1.2)
Total Protein: 6.6 g/dL (ref 6.5–8.1)

## 2021-03-27 LAB — CBC
HCT: 32.2 % — ABNORMAL LOW (ref 39.0–52.0)
Hemoglobin: 11 g/dL — ABNORMAL LOW (ref 13.0–17.0)
MCH: 32.5 pg (ref 26.0–34.0)
MCHC: 34.2 g/dL (ref 30.0–36.0)
MCV: 95.3 fL (ref 80.0–100.0)
Platelets: 230 10*3/uL (ref 150–400)
RBC: 3.38 MIL/uL — ABNORMAL LOW (ref 4.22–5.81)
RDW: 14 % (ref 11.5–15.5)
WBC: 10.8 10*3/uL — ABNORMAL HIGH (ref 4.0–10.5)
nRBC: 0 % (ref 0.0–0.2)

## 2021-03-27 LAB — GLUCOSE, CAPILLARY
Glucose-Capillary: 316 mg/dL — ABNORMAL HIGH (ref 70–99)
Glucose-Capillary: 279 mg/dL — ABNORMAL HIGH (ref 70–99)
Glucose-Capillary: 257 mg/dL — ABNORMAL HIGH (ref 70–99)
Glucose-Capillary: 228 mg/dL — ABNORMAL HIGH (ref 70–99)
Glucose-Capillary: 270 mg/dL — ABNORMAL HIGH (ref 70–99)
Glucose-Capillary: 380 mg/dL — ABNORMAL HIGH (ref 70–99)

## 2021-03-27 SURGERY — ERCP, WITH INTERVENTION IF INDICATED
Anesthesia: General

## 2021-03-27 MED ORDER — CIPROFLOXACIN IN D5W 400 MG/200ML IV SOLN
INTRAVENOUS | Status: DC | PRN
  Administered 2021-03-27: 400 mg via INTRAVENOUS

## 2021-03-27 MED ORDER — PROSOURCE PLUS PO LIQD
30.0000 mL | Freq: Two times a day (BID) | ORAL | Status: DC
  Administered 2021-03-27 – 2021-03-28 (×2): 30 mL via ORAL
  Filled 2021-03-27 (×2): qty 30

## 2021-03-27 MED ORDER — INDOMETHACIN 50 MG RE SUPP
RECTAL | Status: AC
  Filled 2021-03-27: qty 2

## 2021-03-27 MED ORDER — ONDANSETRON HCL 4 MG/2ML IJ SOLN
INTRAMUSCULAR | Status: DC | PRN
  Administered 2021-03-27: 4 mg via INTRAVENOUS

## 2021-03-27 MED ORDER — CIPROFLOXACIN IN D5W 400 MG/200ML IV SOLN
INTRAVENOUS | Status: AC
  Filled 2021-03-27: qty 200

## 2021-03-27 MED ORDER — ONDANSETRON HCL 4 MG/2ML IJ SOLN
INTRAMUSCULAR | Status: AC
  Filled 2021-03-27: qty 2

## 2021-03-27 MED ORDER — GLUCAGON HCL RDNA (DIAGNOSTIC) 1 MG IJ SOLR
INTRAMUSCULAR | Status: AC
  Filled 2021-03-27: qty 1

## 2021-03-27 MED ORDER — CHLORHEXIDINE GLUCONATE CLOTH 2 % EX PADS: 6.0000 | MEDICATED_PAD | Freq: Every day | CUTANEOUS | Status: DC

## 2021-03-27 MED ORDER — GLUCAGON HCL RDNA (DIAGNOSTIC) 1 MG IJ SOLR
INTRAMUSCULAR | Status: DC | PRN
  Administered 2021-03-27: .25 mg via INTRAVENOUS

## 2021-03-27 MED ORDER — DEXAMETHASONE SODIUM PHOSPHATE 4 MG/ML IJ SOLN
INTRAMUSCULAR | Status: DC | PRN
  Administered 2021-03-27: 4 mg via INTRAVENOUS

## 2021-03-27 MED ORDER — INDOMETHACIN 50 MG RE SUPP
RECTAL | Status: DC | PRN
  Administered 2021-03-27: 100 mg via RECTAL

## 2021-03-27 MED ORDER — LIDOCAINE 2% (20 MG/ML) 5 ML SYRINGE
INTRAMUSCULAR | Status: DC | PRN
  Administered 2021-03-27: 60 mg via INTRAVENOUS

## 2021-03-27 MED ORDER — INDOMETHACIN 50 MG RE SUPP: 100.0000 mg | Freq: Once | RECTAL | Status: DC

## 2021-03-27 MED ORDER — SODIUM CHLORIDE 0.9 % IV SOLN
INTRAVENOUS | Status: DC | PRN
  Administered 2021-03-27: 15 mL

## 2021-03-27 MED ORDER — ROCURONIUM BROMIDE 10 MG/ML (PF) SYRINGE
PREFILLED_SYRINGE | INTRAVENOUS | Status: DC | PRN
  Administered 2021-03-27: 60 mg via INTRAVENOUS

## 2021-03-27 MED ORDER — LACTATED RINGERS IV SOLN: Freq: Once | INTRAVENOUS | Status: AC

## 2021-03-27 MED ORDER — INSULIN GLARGINE-YFGN 100 UNIT/ML ~~LOC~~ SOLN
15.0000 [IU] | Freq: Two times a day (BID) | SUBCUTANEOUS | Status: DC
  Administered 2021-03-27: 15 [IU] via SUBCUTANEOUS
  Filled 2021-03-27 (×3): qty 0.15

## 2021-03-27 MED ORDER — PROPOFOL 10 MG/ML IV BOLUS
INTRAVENOUS | Status: DC | PRN
  Administered 2021-03-27: 150 mg via INTRAVENOUS

## 2021-03-27 MED ORDER — BOOST PLUS PO LIQD
237.0000 mL | Freq: Two times a day (BID) | ORAL | Status: DC
  Administered 2021-03-28: 237 mL via ORAL
  Filled 2021-03-27 (×3): qty 237

## 2021-03-27 MED ORDER — FENTANYL CITRATE (PF) 100 MCG/2ML IJ SOLN
INTRAMUSCULAR | Status: AC
  Filled 2021-03-27: qty 2

## 2021-03-27 MED ORDER — BOOST / RESOURCE BREEZE PO LIQD CUSTOM
1.0000 | Freq: Two times a day (BID) | ORAL | Status: DC
  Administered 2021-03-27 – 2021-03-28 (×2): 1 via ORAL

## 2021-03-27 MED ORDER — PHENYLEPHRINE 40 MCG/ML (10ML) SYRINGE FOR IV PUSH (FOR BLOOD PRESSURE SUPPORT)
PREFILLED_SYRINGE | INTRAVENOUS | Status: DC | PRN
  Administered 2021-03-27 (×2): 80 ug via INTRAVENOUS

## 2021-03-27 MED ORDER — FENTANYL CITRATE (PF) 100 MCG/2ML IJ SOLN
INTRAMUSCULAR | Status: DC | PRN
  Administered 2021-03-27 (×2): 50 ug via INTRAVENOUS

## 2021-03-27 MED ORDER — SODIUM CHLORIDE 0.9 % IV SOLN
1.5000 g | Freq: Once | INTRAVENOUS | Status: DC
  Filled 2021-03-27: qty 4

## 2021-03-27 MED ORDER — SUGAMMADEX SODIUM 200 MG/2ML IV SOLN
INTRAVENOUS | Status: DC | PRN
  Administered 2021-03-27: 200 mg via INTRAVENOUS

## 2021-03-27 NOTE — Plan of Care (Signed)
  Problem: Activity: Goal: Risk for activity intolerance will decrease Outcome: Completed/Met

## 2021-03-27 NOTE — TOC Initial Note (Signed)
Transition of Care [facility]) - Initial/Assessment Note    Patient Details  Name: [name] [name] MRN: [id number] Date of Birth: 03-01-1961  [facility] CM/SW Contact:    Dessa Phi, RN Phone Number: 03/27/2021, 10:06 AM  Clinical Narrative: Spoke to patient about d/c plans-d/c home. Has pcp,pharmacy,family support, medicaid pending-he will f/u on own, has own transport home. No CM needs.                  Expected Discharge Plan: Home/Self Care Barriers to Discharge: Continued Medical Work up   Patient Goals and CMS Choice Patient states their goals for this hospitalization and ongoing recovery are:: go home CMS Medicare.gov Compare Post Acute Care list provided to:: Patient    Expected Discharge Plan and Services Expected Discharge Plan: Home/Self Care   Discharge Planning Services: CM Consult   Living arrangements for the past 2 months: Single Family Home                                      Prior Living Arrangements/Services Living arrangements for the past 2 months: Single Family Home Lives with:: Self Patient language and need for interpreter reviewed:: Yes Do you feel safe going back to the place where you live?: Yes      Need for Family Participation in Patient Care: No (Comment) Care giver support system in place?: Yes (comment)   Criminal Activity/Legal Involvement Pertinent to Current Situation/Hospitalization: No - Comment as needed  Activities of Daily Living Home Assistive Devices/Equipment: Eyeglasses ADL Screening (condition at time of admission) Patient's cognitive ability adequate to safely complete daily activities?: Yes Is the patient deaf or have difficulty hearing?: No Does the patient have difficulty seeing, even when wearing glasses/contacts?: No Does the patient have difficulty concentrating, remembering, or making decisions?: No Patient able to express need for assistance with ADLs?: Yes Does the patient have difficulty  dressing or bathing?: No Independently performs ADLs?: Yes (appropriate for developmental age) Does the patient have difficulty walking or climbing stairs?: No Weakness of Legs: Both Weakness of Arms/Hands: Both  Permission Sought/Granted Permission sought to share information with : Case Manager Permission granted to share information with : Yes, Verbal Permission Granted  Share Information with NAME: Case manager           Emotional Assessment Appearance:: Appears stated age Attitude/Demeanor/Rapport: Gracious Affect (typically observed): Accepting Orientation: : Oriented to Self, Oriented to Place, Oriented to  Time, Oriented to Situation Alcohol / Substance Use: Not Applicable Psych Involvement: No (comment)  Admission diagnosis:  Biliary obstruction [K83.1] Hyperglycemia [R73.9] Elevated bilirubin [R17] Hyperglycemia due to diabetes mellitus ([place]) [E11.65] Patient Active Problem List   Diagnosis Date Noted   Hyperglycemia due to diabetes mellitus ([place]) 03/26/2021   Pancreatic cancer ([place]) 03/12/2021   Malignant neoplasm of head of pancreas ([place]) 03/12/2021   Jaundice 02/26/2021   Loss of weight 02/26/2021   S/P transmetatarsal amputation of foot, left ([place]) 11/28/2020   Hypertension associated with type 2 diabetes mellitus ([place]) 11/28/2020   Type 2 diabetes mellitus with diabetic neuropathy, unspecified ([place]) 11/28/2020   Hyperlipidemia associated with type 2 diabetes mellitus ([place]) 11/28/2020   GERD without esophagitis 11/28/2020   Major depression, recurrent, chronic ([place]) 11/28/2020   Aortic atherosclerosis ([place]) 11/28/2020   Diabetic neuropathy ([place]) 11/28/2020   Osteomyelitis ([place]) 11/14/2020   Sepsis ([place]) 10/28/2020   Community acquired pneumonia of right lower lobe  of lung    Elevated LFTs    Hypophosphatemia    Acute metabolic encephalopathy    Acute maxillary sinusitis    Depression    Physical deconditioning    Pneumonia 05/14/2020   DTs (delirium  tremens) ([place]) 05/13/2020   Hyponatremia 05/13/2020   Hypokalemia 05/13/2020   SIRS (systemic inflammatory response syndrome) ([place]) 05/13/2020   Sepsis with acute hypoxic respiratory failure without septic shock (H[facility])    Alcohol abuse with alcohol-induced mood disorder ([place]) 12/17/2017   Hereditary hemochromatosis ([place]) 11/23/2017   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 [place] [address] Phone: [phone] Fax: [place], [place] [address] [place] [address] Phone: [phone] Fax: [phone]  [place] [address] Phone: [phone] Fax: [phone]     Social Determinants of Health (SDOH) Interventions    Readmission Risk Interventions Readmission Risk Prevention Plan 03/27/2021 11/14/2020  Transportation Screening Complete Complete  PCP or Specialist Appt within 3-5 Days Complete -  HRI or [place] Complete Complete  Social Work Consult for [place] Planning/Counseling Complete Complete  Palliative Care Screening Not Applicable Not Applicable  Medication Review Press photographer) Complete Complete  Some recent data might be hidden

## 2021-03-27 NOTE — Anesthesia Postprocedure Evaluation (Signed)
Anesthesia Post Note  Patient: [name] [name]  Procedure(s) Performed: ENDOSCOPIC RETROGRADE CHOLANGIOPANCREATOGRAPHY (ERCP) STENT REMOVAL BILIARY STENT PLACEMENT BIOPSY     Patient location during evaluation: Endoscopy Anesthesia Type: General Level of consciousness: awake and alert Pain management: pain level controlled Vital Signs Assessment: post-procedure vital signs reviewed and stable Respiratory status: spontaneous breathing, nonlabored ventilation, respiratory function stable and patient connected to nasal cannula oxygen Cardiovascular status: blood pressure returned to baseline and stable Postop Assessment: no apparent nausea or vomiting Anesthetic complications: no   No notable events documented.  Last Vitals:  Vitals:   03/27/21 1444 03/27/21 1450  BP: (!) 147/72 140/68  Pulse: 87 84  Resp: 20 19  Temp: 36.6 C   SpO2: 100% 99%    Last Pain:  Vitals:   03/27/21 1450  TempSrc:   PainSc: 0-No pain                 Barnet Glasgow

## 2021-03-27 NOTE — Anesthesia Procedure Notes (Signed)
Procedure Name: Intubation Date/Time: 03/27/2021 1:48 PM Performed by: Eulas Post, Allesandra Huebsch W, CRNA Pre-anesthesia Checklist: Patient identified, Emergency Drugs available, Suction available and Patient being monitored Patient Re-evaluated:Patient Re-evaluated prior to induction Oxygen Delivery Method: Circle system utilized Preoxygenation: Pre-oxygenation with 100% oxygen Induction Type: IV induction Ventilation: Mask ventilation without difficulty Laryngoscope Size: Miller and 2 Grade View: Grade I Tube type: Oral Tube size: 7.0 mm Number of attempts: 1 Airway Equipment and Method: Stylet Placement Confirmation: ETT inserted through vocal cords under direct vision, positive ETCO2 and breath sounds checked- equal and bilateral Secured at: 23 cm Tube secured with: Tape Dental Injury: Teeth and Oropharynx as per pre-operative assessment

## 2021-03-27 NOTE — Op Note (Signed)
[facility] Patient Name: [name] [name] Procedure Date: 03/27/2021 MRN: [id number] Attending MD: Jackquline Denmark , MD Date of Birth: 28-Sep-1960 CSN: [id number] Age: 60 Admit Type: Inpatient Procedure:                ERCP Indications:              51 yr M with recent diagnosis of borderline                            resectable pancreatic adeno ca with biliary                            obstruction s/p plastic biliary stent (10Fr 5 cm)                            on 02/27/2021, admitted with worsening LFT and                            jaundice. Providers:                Jackquline Denmark, MD, Kary Kos RN, RN, Janee Morn, Technician, Tyna Jaksch Technician Referring MD:              Medicines:                General Anesthesia, Cipro 400 mg IV x1, glucagon,                            Indocin suppositories. Complications:            No immediate complications. Estimated Blood Loss:     Estimated blood loss: none. Procedure:                Pre-Anesthesia Assessment:                           - Prior to the procedure, a History and Physical                            was performed, and patient medications and                            allergies were reviewed. The patient's tolerance of                            previous anesthesia was also reviewed. The risks                            and benefits of the procedure and the sedation                            options and risks were discussed with the patient.  All questions were answered, and informed consent                            was obtained. Prior Anticoagulants: The patient has                            taken no previous anticoagulant or antiplatelet                            agents. ASA Grade Assessment: III - A patient with                            severe systemic disease. After reviewing the risks                            and benefits, the patient  was deemed in                            satisfactory condition to undergo the procedure.                           After obtaining informed consent, the scope was                            passed under direct vision. Throughout the                            procedure, the patient's blood pressure, pulse, and                            oxygen saturations were monitored continuously. The                            [id number] Olympus Duodenoscope was                            introduced through the mouth, and used to inject                            contrast into and used to inject contrast into the                            bile duct. The ERCP was accomplished without                            difficulty. The patient tolerated the procedure                            well. Scope In: Scope Out: Findings:      A biliary stent was visible on the scout film which has migrated       completely into the CBD. The major papilla was edematous with suggestion       of previous sphincterotomy. One stent was removed from the common bile  duct using a rat-toothed forceps. It was partially occluded. There was       some bleeding at the papilla which stopped on its own.      The bile duct was deeply cannulated with the short-nosed traction       sphincterotome. Contrast was injected. I personally interpreted the bile       duct images. Ductal flow of contrast was adequate. Image quality was       adequate. Contrast extended to the entire biliary tree. The lower third       of the main bile duct contained a single severe stenosis 1.5 cm in       length. One 10 Fr by 4 cm covered metal stent was placed into the common       bile duct. Copious green bile flowed through the stent. The stent was in       good position.      The pancreatic duct was intentionally not cannulated.      Mild antral gastritis was noted. No ulcers. Few biopsies were obtained       to rule out HP. Impression:                - Distal CBD stricture s/p insertion of covered                            metal biliary stent.                           -Previous stent had migrated into the CBD s/p stent                            extraction. Moderate Sedation:      Not Applicable - Patient had care per Anesthesia. Recommendation:           - Return patient to hospital ward for ongoing care.                           - Full liquid diet today at 3:30 PM. Advance diet                            as tolerated.                           - Recheck LFTs in AM.                           - Watch for pancreatitis, bleeding, perforation,                            and cholangitis.                           - The findings and recommendations were discussed                            with the patient's family. Procedure Code(s):        --- Professional ---  [id number], Endoscopic retrograde                            cholangiopancreatography (ERCP); with removal and                            exchange of stent(s), biliary or pancreatic duct,                            including pre- and post-dilation and guide wire                            passage, when performed, including sphincterotomy,                            when performed, each stent exchanged                           43261, 74, Endoscopic retrograde                            cholangiopancreatography (ERCP); with biopsy,                            single or multiple                           74328, Endoscopic catheterization of the biliary                            ductal system, radiological supervision and                            interpretation Diagnosis Code(s):        --- Professional ---                           K83.1, Obstruction of bile duct                           Z46.59, Encounter for fitting and adjustment of                            other gastrointestinal appliance and device                           R17,  Unspecified jaundice CPT copyright 2019 [employer]. All rights reserved. The codes documented in this report are preliminary and upon coder review may  be revised to meet current compliance requirements. Jackquline Denmark, MD 03/27/2021 2:48:20 PM This report has been signed electronically. Number of Addenda: 0

## 2021-03-27 NOTE — Interval H&P Note (Signed)
History and Physical Interval Note:  03/27/2021 1:12 PM  [name] [name]  has presented today for surgery, with the diagnosis of biliary obstruction.  The various methods of treatment have been discussed with the patient and family. After consideration of risks, benefits and other options for treatment, the patient has consented to  Procedure(s): ENDOSCOPIC RETROGRADE CHOLANGIOPANCREATOGRAPHY (ERCP) (N/A) as a surgical intervention.  The patient's history has been reviewed, patient examined, no change in status, stable for surgery.  I have reviewed the patient's chart and labs.  Questions were answered to the patient's satisfaction.     Jackquline Denmark

## 2021-03-27 NOTE — Plan of Care (Signed)
  Problem: Education: Goal: Knowledge of General Education information will improve Description: Including pain rating scale, medication(s)/side effects and non-pharmacologic comfort measures Outcome: Progressing   Problem: Clinical Measurements: Goal: Will remain free from infection Outcome: Progressing Goal: Diagnostic test results will improve Outcome: Progressing   Problem: Coping: Goal: Level of anxiety will decrease Outcome: Progressing   

## 2021-03-27 NOTE — Transfer of Care (Signed)
Immediate Anesthesia Transfer of Care Note  Patient: [name] [name]  Procedure(s) Performed: ENDOSCOPIC RETROGRADE CHOLANGIOPANCREATOGRAPHY (ERCP) STENT REMOVAL BILIARY STENT PLACEMENT BIOPSY  Patient Location: Endoscopy Unit  Anesthesia Type:General  Level of Consciousness: awake  Airway & Oxygen Therapy: Patient Spontanous Breathing and Patient connected to face mask oxygen  Post-op Assessment: Report given to RN and Post -op Vital signs reviewed and stable  Post vital signs: Reviewed and stable  Last Vitals:  Vitals Value Taken Time  BP 147/72 03/27/21 1443  Temp    Pulse 84 03/27/21 1445  Resp 21 03/27/21 1445  SpO2 100 % 03/27/21 1445  Vitals shown include unvalidated device data.  Last Pain:  Vitals:   03/27/21 1230  TempSrc: Oral  PainSc: 3       Patients Stated Pain Goal: 0 ([id number])  Complications: No notable events documented.

## 2021-03-27 NOTE — Progress Notes (Signed)
Initial Nutrition Assessment  DOCUMENTATION CODES:   Non-severe (moderate) malnutrition in context of acute illness/injury  INTERVENTION:  - diet advancement as medically feasible. - will order Boost Breeze BID, each supplement provides 250 kcal and 9 grams of protein. - will order Boost Plus BID, each supplement provides 360 kcal and 14 grams of protein. - will order 30 ml Prosource Plus BID, each supplement provides 100 kcal and 15 grams protein.  - will order 1 tablet multivitamin with minerals/day.    NUTRITION DIAGNOSIS:   Moderate Malnutrition related to acute illness, cancer and cancer related treatments as evidenced by mild fat depletion, mild muscle depletion, moderate muscle depletion.  GOAL:   Patient will meet greater than or equal to 90% of their needs  MONITOR:   Diet advancement, PO intake, Supplement acceptance, Labs, Weight trends, I & O's  REASON FOR ASSESSMENT:   Malnutrition Screening Tool  ASSESSMENT:   60 year old male with stage 2 adenocarcinoma of the pancreas, DM, GERD, and Barrett's esophagus. He presented to the ED due to worsening jaundice. He is s/p ERCP and non-metallic biliary stent on 6/30 and plan was to start chemo on 7/28 AM.  Patient laying in bed with his mom at bedside. Patient lives alone in a small, one bedroom apartment. He has a crock pot, microwave, outdoor grill, and a burner top that he uses to prepare foods. His refrigerator is small and after storing food for 3-4 days, he reports that he will begin to get fruit flies.   He weighs himself at home and UBW for a long time was 178 lb. The last time he weighed this was in January or February. He reports steadily losing weight each week or few days. Weight yesterday was 157 lb. Weight on 12/05/20 was 163 lb. This indicates 6 lb weight loss (3.7% body weight) in the past 3.5 months; not significant for time frame.  He was provided with creon starting a week and a half ago. Patient had  confusion on how to take this medication and was taking 2 capsules after meals. He was eating 3 meals/day. He reports he was encouraged to eat 6 smaller meals/day. We discussed what this would look like and recommendation for creon with this meal set up. This information will need to be reinforced with patient.   He has been having multiple bouts of diarrhea daily for several months. He reports fecal urgency and often not making it to the toilet on time. He states his "record" was 18 episodes of diarrhea in one day.   At home he was mainly drinking zero sugar [facility] with Mio drops. He reports being encouraged to drink at least 64 oz of caffeine-free liquids/day. He was also drinking some water with Mio drops, milk, Muscle Mix, protein powder mixed in juice, and sugar-free gatorade.   Patient was to start chemo today but currently on hold due to current acute illness. He has hx of DM and reported that he stopped taking his insulin several days ago and that CBGs have been creeping up.    Labs reviewed; CBGs: 316, 279, 257, 228 mg/dl, Na: 130 mmol/l, creatinine: 0.33 mg/dl, Ca: 8.4 mg/dl, LFTs elevated but trending down. Medications reviewed; 1 mg folvite/day, sliding scale novolog, 15 units semglee BID, 09811 units creon TID, 40 mg oral protonix/day. IVF; NS @ 125 ml/hr.    NUTRITION - FOCUSED PHYSICAL EXAM:  Flowsheet Row Most Recent Value  Orbital Region Mild depletion  Upper Arm Region Moderate depletion  Thoracic  and Lumbar Region Unable to assess  Buccal Region Mild depletion  Temple Region Mild depletion  Clavicle Bone Region Moderate depletion  Clavicle and Acromion Bone Region Moderate depletion  Scapular Bone Region Moderate depletion  Dorsal Hand Mild depletion  Patellar Region Unable to assess  Anterior Thigh Region Unable to assess  Posterior Calf Region Unable to assess  Edema (RD Assessment) Unable to assess  Hair Reviewed  Eyes Reviewed  Mouth Reviewed  Skin  Reviewed  Nails Reviewed       Diet Order:   Diet Order             Diet NPO time specified Except for: Sips with Meds  Diet effective midnight                   EDUCATION NEEDS:   Not appropriate for education at this time  Skin:  Skin Assessment: Reviewed RN Assessment  Last BM:  PTA/unknown  Height:   Ht Readings from Last 1 Encounters:  03/26/21 '5\' 11"'$  (1.803 m)    Weight:   Wt Readings from Last 1 Encounters:  03/26/21 71.1 kg      Estimated Nutritional Needs:  Kcal:  2150-2400 kcal Protein:  105-120 grams Fluid:  >/= 3 L/day     Jarome Matin, MS, RD, LDN, CNSC Inpatient Clinical Dietitian RD pager # available in [facility]  After hours/weekend pager # available in [facility]

## 2021-03-27 NOTE — Progress Notes (Signed)
[facility]                                                                              Patient Demographics  [name] [name], is a 60 y.o. male, DOB - Feb 26, 1961, [id number]  Admit date - 03/26/2021   Admitting Physician [name] Bullock, MD  Outpatient Primary MD for the patient is [name] Pounds, NP  Outpatient specialists:   LOS - 1  days   Medical records reviewed and are as summarized below:    Chief Complaint  Patient presents with   Abnormal Lab       Brief summary   Patient is a 60 year old male with adenocarcinoma of the pancreas, stage IIb, DM, GERD, Barrett's esophagus presented to ED with worsening jaundice.  He was recently diagnosed with pancreatic CA, status post recent ERCP and placement of nonmetallic biliary stent on 6/30, scheduled to start chemo on 03/27/2021. Post insertion of the biliary stent, patient had blood work done which showed increased levels of his total bilirubin and worsening jaundice and was referred to ER by his gastroenterologist for admission for repeat ERCP. Also reports diarrhea which he states is clay colored In ED, alkaline phosphatase 778, AST 142, ALT 88, total bilirubin 11.7.  Albumin 2.5, lipase 14,   Assessment & Plan    Principal Problem:   Hyperglycemia due to diabetes mellitus ([name] [name]), pancreatic CA, noncompliance -Patient had reported that he stopped taking his Lantus for the last couple of days.  Hold glipizide -Patient was on Lantus 30 units twice daily, will resume 15 units twice daily due to hyperglycemia and n.p.o., continue sliding scale insulin  Active Problems: Pancreatic adenocarcinoma, transaminitis, obstructive jaundice -Status post ERCP and nonmetallic biliary stent on 6/30, repeat ERCP with stent exchange today -GI following, n.p.o. - will notify oncology, Dr. Lorenso [name]  Chronic diarrhea -Patient has been on pancreatic enzyme replacement, will continue -Was C. difficile negative in  March  Hyponatremia -Likely pseudohyponatremia due to hyperglycemia -Improving  Essential hypertension Continue losartan  Depression -Continue Cymbalta, Remeron  Nicotine dependence -Continue nicotine patch   Code Status: Full CODE STATUS DVT Prophylaxis:  SCDs Start: 03/26/21 1354   Level of Care: Level of care: Progressive Family Communication: Discussed all imaging results, lab results, explained to the patient and mother at the bedside   Disposition Plan:     Status is: Inpatient  Remains inpatient appropriate because:Inpatient level of care appropriate due to severity of illness  Dispo: The patient is from: Home              Anticipated d/c is to: Home              Patient currently is not medically stable to d/c.   Difficult to place patient No      Time Spent in minutes   47mns    Procedures:  None  Consultants:   GI  Antimicrobials:   Anti-infectives (From admission, onward)    None          Medications  Scheduled Meds:  Chlorhexidine Gluconate Cloth  6 each Topical Daily   DULoxetine  60 mg Oral Daily  folic acid  1 mg Oral Daily   insulin aspart  0-15 Units Subcutaneous TID WC   lipase/protease/amylase  36,000 Units Oral TID AC   losartan  50 mg Oral Daily   mirtazapine  30 mg Oral QHS   multivitamin with minerals  1 tablet Oral Daily   nicotine  21 mg Transdermal Daily   pantoprazole  40 mg Oral Daily   Continuous Infusions:  sodium chloride 125 mL/hr at 03/27/21 0454   PRN Meds:.albuterol, morphine injection, ondansetron **OR** ondansetron (ZOFRAN) IV      Subjective:   [name] [name] was seen and examined today.  Has some abdominal cramps in the lower abdomen otherwise no acute abdominal pain.  Patient denies dizziness, chest pain, shortness of breath, N/V/D/C, new weakness, numbess, tingling. No acute events overnight.    Objective:   Vitals:   03/26/21 2100 03/27/21 0054 03/27/21 0611 03/27/21 1013  BP: 131/74  129/68 (!) 142/83 129/72  Pulse: 74 81 72 79  Resp: '20 20 20 16  '$ Temp: 99.1 F (37.3 C) 98.9 F (37.2 C) 98.8 F (37.1 C) 99.2 F (37.3 C)  TempSrc: Oral Oral Oral   SpO2: 100% 97% 98% 100%  Weight:      Height:        Intake/Output Summary (Last 24 hours) at 03/27/2021 1019 Last data filed at 03/27/2021 0636 Gross per 24 hour  Intake 1475 ml  Output 1500 ml  Net -25 ml     Wt Readings from Last 3 Encounters:  03/26/21 71.1 kg  03/21/21 66.2 kg  03/12/21 69.7 kg     Exam General: Alert and oriented x 3, NAD Cardiovascular: S1 S2 auscultated, no murmurs, RRR Respiratory: Clear to auscultation bilaterally, no wheezing, rales or rhonchi Gastrointestinal: Soft, nontender, nondistended, + bowel sounds Ext: no pedal edema bilaterally Neuro: no new deficits Skin: No rashes Psych: Normal affect and demeanor, alert and oriented x3    Data Reviewed:  I have personally reviewed following labs and imaging studies  Micro Results Recent Results (from the past 240 hour(s))  Resp Panel by RT-PCR (Flu A&B, Covid) Nasopharyngeal Swab     Status: None   Collection Time: 03/26/21 10:49 AM   Specimen: Nasopharyngeal Swab; Nasopharyngeal(NP) swabs in vial transport medium  Result Value Ref Range Status   SARS Coronavirus 2 by RT PCR NEGATIVE NEGATIVE Final    Comment: (NOTE) SARS-CoV-2 target nucleic acids are NOT DETECTED.  The SARS-CoV-2 RNA is generally detectable in upper respiratory specimens during the acute phase of infection. The lowest concentration of SARS-CoV-2 viral copies this assay can detect is 138 copies/mL. A negative result does not preclude SARS-Cov-2 infection and should not be used as the sole basis for treatment or other patient management decisions. A negative result may occur with  improper specimen collection/handling, submission of specimen other than nasopharyngeal swab, presence of viral mutation(s) within the areas targeted by this assay, and  inadequate number of viral copies(<138 copies/mL). A negative result must be combined with clinical observations, patient history, and epidemiological information. The expected result is Negative.  Fact Sheet for Patients:  EntrepreneurPulse.com.au  Fact Sheet for Healthcare Providers:  IncredibleEmployment.be  This test is no t yet approved or cleared by the [place] FDA and  has been authorized for detection and/or diagnosis of SARS-CoV-2 by FDA under an Emergency Use Authorization (EUA). This EUA will remain  in effect (meaning this test can be used) for the duration of the COVID-19 declaration under Section 564(b)(1) of  the Act, 21 U.S.C.section 360bbb-3(b)(1), unless the authorization is terminated  or revoked sooner.       Influenza A by PCR NEGATIVE NEGATIVE Final   Influenza B by PCR NEGATIVE NEGATIVE Final    Comment: (NOTE) The Xpert Xpress SARS-CoV-2/FLU/RSV plus assay is intended as an aid in the diagnosis of influenza from Nasopharyngeal swab specimens and should not be used as a sole basis for treatment. Nasal washings and aspirates are unacceptable for Xpert Xpress SARS-CoV-2/FLU/RSV testing.  Fact Sheet for Patients: EntrepreneurPulse.com.au  Fact Sheet for Healthcare Providers: IncredibleEmployment.be  This test is not yet approved or cleared by the [place] FDA and has been authorized for detection and/or diagnosis of SARS-CoV-2 by FDA under an Emergency Use Authorization (EUA). This EUA will remain in effect (meaning this test can be used) for the duration of the COVID-19 declaration under Section 564(b)(1) of the Act, 21 U.S.C. section 360bbb-3(b)(1), unless the authorization is terminated or revoked.  Performed at [facility], [place] [address] [place], [place] [address]     Radiology Reports CT ABDOMEN PELVIS W WO CONTRAST  Result Date:  02/26/2021 CLINICAL DATA:  Cirrhosis, abdominal pain, weight loss, incontinence, diarrhea, evaluate for hepatic or pancreatic malignancy EXAM: CT ABDOMEN AND PELVIS WITHOUT AND WITH CONTRAST TECHNIQUE: Multidetector CT imaging of the abdomen and pelvis was performed following the standard protocol before and following the bolus administration of intravenous contrast. CONTRAST:  139m OMNIPAQUE IOHEXOL 300 MG/ML  SOLN COMPARISON:  05/13/2020 FINDINGS: Lower chest: No acute abnormality. Hepatobiliary: No solid liver abnormality is seen. Distended gallbladder with intra and extrahepatic biliary ductal dilatation to the pancreatic head, with abrupt truncation in the pancreatic head (series 11, image 31). The common bile duct measures up to 1.8 cm in caliber. Pancreas: There is a hypodense mass of the central pancreatic head measuring 4.4 x 3.7 cm, with abrupt truncation of the pancreatic duct, which is dilated along its remaining length to the tail the pancreatic duct measures up to 5 mm in caliber. This mass closely abuts and partially encases less than 50% of the right aspect of the central superior mesenteric vein and confluence of the portal vein (series 7, image 36). The mass is clearly separated by a fat plane from the superior mesenteric artery, celiac axis, and common hepatic artery. Spleen: Normal in size without significant abnormality. Adrenals/Urinary Tract: Adrenal glands are unremarkable. Kidneys are normal, without renal calculi, solid lesion, or hydronephrosis. Bladder is unremarkable. Stomach/Bowel: Stomach is within normal limits. Appendix appears normal. No evidence of bowel wall thickening, distention, or inflammatory changes. Sigmoid diverticula. Vascular/Lymphatic: Aortic atherosclerosis. Interval enlargement of portacaval lymph nodes measuring up to 2.7 x 1.0 cm (series 7, image 25). Reproductive: No mass or other significant abnormality. Other: No abdominal wall hernia or abnormality. No  abdominopelvic ascites. Musculoskeletal: No acute or significant osseous findings. IMPRESSION: 1. There is a new hypodense mass of the central pancreatic head measuring 4.4 x 3.7 cm, with abrupt truncation of both the common bile duct and pancreatic duct, consistent with pancreatic adenocarcinoma. 2. This mass closely abuts and partially encases less than 50% of the right aspect of the central superior mesenteric vein and confluence of the portal vein. The mass is clearly separated by a fat plane from the superior mesenteric artery, celiac axis, and common hepatic artery. 3. Interval enlargement of portacaval lymph nodes measuring up to 2.7 x 1.0 cm, nonspecific. Nodal metastatic disease not excluded. 4. No definite evidence of nodal or soft tissue metastatic  disease in the abdomen or pelvis. These results will be called to the ordering clinician or representative by the Radiologist Assistant, and communication documented in the PACS or [employer]. Aortic Atherosclerosis (ICD10-I70.0). Electronically Signed   By: Eddie Candle M.D.   On: 02/26/2021 13:14   CT CHEST W CONTRAST  Result Date: 03/06/2021 CLINICAL DATA:  Pancreatic adenocarcinoma chest staging EXAM: CT CHEST WITH CONTRAST TECHNIQUE: Multidetector CT imaging of the chest was performed during intravenous contrast administration. CONTRAST:  27m OMNIPAQUE IOHEXOL 300 MG/ML  SOLN COMPARISON:  CT abdomen pelvis, 02/26/2021 FINDINGS: Cardiovascular: Aortic atherosclerosis. Normal heart size. Left and right coronary artery calcifications. No pericardial effusion. Mediastinum/Nodes: No enlarged mediastinal, hilar, or axillary lymph nodes. Thyroid gland, trachea, and esophagus demonstrate no significant findings. Lungs/Pleura: Mild centrilobular and paraseptal emphysema. Background of very fine centrilobular pulmonary nodules, concentrated in the lung apices. Bandlike scarring of the left upper lobe. No pleural effusion or pneumothorax. Upper Abdomen: No  acute abnormality. Musculoskeletal: No chest wall mass or suspicious bone lesions identified. IMPRESSION: 1. No evidence of metastatic disease in the chest. 2. Background of very fine centrilobular pulmonary nodules, concentrated in the lung apices, consistent with smoking-related respiratory bronchiolitis. 3. Emphysema. 4. Coronary artery disease. Aortic Atherosclerosis (ICD10-I70.0) and Emphysema (ICD10-J43.9). Electronically Signed   By: AEddie CandleM.D.   On: 03/06/2021 14:31   DG ERCP  Result Date: 02/27/2021 CLINICAL DATA:  Pancreatic lesion. EXAM: ERCP TECHNIQUE: Multiple spot images obtained with the fluoroscopic device and submitted for interpretation post-procedure. FLUOROSCOPY TIME:  Fluoroscopy Time:  1 minute, 50 seconds COMPARISON:  CT abdomen 02/26/2021 FINDINGS: Wire was advanced into the biliary system and retrograde cholangiogram was obtained. Contrast fills a dilated proximal common bile duct. There appears to be a severe distal common bile duct stricture. Minor filling of the intrahepatic bile ducts. Placement of a nonmetallic biliary stent. IMPRESSION: Distal common bile duct obstruction or stricture. Placement of a nonmetallic biliary stent. These images were submitted for radiologic interpretation only. Please see the procedural report for the amount of contrast and the fluoroscopy time utilized. Electronically Signed   By: AMarkus DaftM.D.   On: 02/27/2021 16:51   IR IMAGING GUIDED PORT INSERTION  Result Date: 03/21/2021 INDICATION: 60year old male with pancreatic cancer. He presents for port catheter placement to establish durable venous access. EXAM: IMPLANTED PORT A CATH PLACEMENT WITH ULTRASOUND AND FLUOROSCOPIC GUIDANCE MEDICATIONS: None ANESTHESIA/SEDATION: 100 mcg fentanyl administered for pain control. This is not moderate sedation. FLUOROSCOPY TIME:  0 minutes, 12 seconds (2 mGy) COMPLICATIONS: None immediate. PROCEDURE: The right neck and chest was prepped with  chlorhexidine, and draped in the usual sterile fashion using maximum barrier technique (cap and mask, sterile gown, sterile gloves, large sterile sheet, hand hygiene and cutaneous antiseptic). Local anesthesia was attained by infiltration with 1% lidocaine with epinephrine. Ultrasound demonstrated patency of the right internal jugular vein, and this was documented with an image. Under real-time ultrasound guidance, this vein was accessed with a 21 gauge micropuncture needle and image documentation was performed. A small dermatotomy was made at the access site with an 11 scalpel. A 0.018" wire was advanced into the SVC and the access needle exchanged for a 45F micropuncture vascular sheath. The 0.018" wire was then removed and a 0.035" wire advanced into the IVC. An appropriate location for the subcutaneous reservoir was selected below the clavicle and an incision was made through the skin and underlying soft tissues. The subcutaneous tissues were then dissected using a combination of  blunt and sharp surgical technique and a pocket was formed. A single lumen power injectable portacatheter was then tunneled through the subcutaneous tissues from the pocket to the dermatotomy and the port reservoir placed within the subcutaneous pocket. The venous access site was then serially dilated and a peel away vascular sheath placed over the wire. The wire was removed and the port catheter advanced into position under fluoroscopic guidance. The catheter tip is positioned in the superior cavoatrial junction. This was documented with a spot image. The portacatheter was then tested and found to flush and aspirate well. The port was flushed with saline followed by 100 units/mL heparinized saline. The pocket was then closed in two layers using first subdermal inverted interrupted absorbable sutures followed by a running subcuticular suture. The epidermis was then sealed with Dermabond. The dermatotomy at the venous access site was also  closed with Dermabond. IMPRESSION: Successful placement of a right IJ approach Power Port with ultrasound and fluoroscopic guidance. The catheter is ready for use. Electronically Signed   By: Jacqulynn Cadet M.D.   On: 03/21/2021 10:31    Lab Data:  CBC: Recent Labs  Lab 03/26/21 1048 03/27/21 0511  WBC 10.2 10.8*  NEUTROABS 7.5  --   HGB 11.9* 11.0*  HCT 34.5* 32.2*  MCV 93.2 95.3  PLT 230 [id number]   Basic Metabolic Panel: Recent Labs  Lab 03/21/21 0840 03/26/21 1048 03/27/21 0511  NA 135 127* 130*  K 3.6 4.6 3.8  CL 101 91* 98  CO2 '23 26 25  '$ GLUCOSE 180* 559* 296*  BUN '10 6 10  '$ CREATININE <0.30* 0.46* 0.33*  CALCIUM 9.1 8.8* 8.4*   GFR: Estimated Creatinine Clearance: 101.2 mL/min (A) (by C-G formula based on SCr of 0.33 mg/dL (L)). Liver Function Tests: Recent Labs  Lab 03/21/21 0840 03/24/21 1238 03/26/21 1048 03/27/21 0511  AST 178* 166* 142* 143*  ALT 80* 98* 88* 81*  ALKPHOS 597* 862* 778* 709*  BILITOT 10.0* 14.2* 11.7* 12.0*  PROT 7.7 8.2 7.6 6.6  ALBUMIN 2.9* 3.6 2.5* 2.4*   Recent Labs  Lab 03/26/21 1048  LIPASE 14   No results for input(s): AMMONIA in the last 168 hours. Coagulation Profile: Recent Labs  Lab 03/26/21 1048  INR 1.2   Cardiac Enzymes: No results for input(s): CKTOTAL, CKMB, CKMBINDEX, TROPONINI in the last 168 hours. BNP (last 3 results) No results for input(s): PROBNP in the last 8760 hours. HbA1C: No results for input(s): HGBA1C in the last 72 hours. CBG: Recent Labs  Lab 03/26/21 1755 03/26/21 2129 03/27/21 0113 03/27/21 0450 03/27/21 0731  GLUCAP 362* 201* 316* 279* 257*   Lipid Profile: No results for input(s): CHOL, HDL, LDLCALC, TRIG, CHOLHDL, LDLDIRECT in the last 72 hours. Thyroid Function Tests: No results for input(s): TSH, T4TOTAL, FREET4, T3FREE, THYROIDAB in the last 72 hours. Anemia Panel: No results for input(s): VITAMINB12, FOLATE, FERRITIN, TIBC, IRON, RETICCTPCT in the last 72 hours. Urine  analysis:    Component Value Date/Time   COLORURINE YELLOW 10/28/2020 1014   APPEARANCEUR CLEAR 10/28/2020 1014   LABSPEC 1.026 10/28/2020 1014   PHURINE 5.0 10/28/2020 1014   GLUCOSEU >=500 (A) 10/28/2020 1014   HGBUR SMALL (A) 10/28/2020 1014   BILIRUBINUR NEGATIVE 10/28/2020 1014   BILIRUBINUR neg 08/03/2017 0959   KETONESUR 5 (A) 10/28/2020 1014   PROTEINUR NEGATIVE 10/28/2020 1014   UROBILINOGEN 0.2 08/03/2017 0959   UROBILINOGEN 0.2 09/28/2013 1520   NITRITE NEGATIVE 10/28/2020 [place] 10/28/2020 1014  Estill Cotta M.D. [facility] 03/27/2021, 10:19 AM  Available via Epic secure chat 7am-7pm After 7 pm, please refer to night coverage provider listed on amion.

## 2021-03-28 ENCOUNTER — Other Ambulatory Visit: Payer: Self-pay

## 2021-03-28 ENCOUNTER — Other Ambulatory Visit (HOSPITAL_COMMUNITY): Payer: Self-pay

## 2021-03-28 ENCOUNTER — Encounter: Payer: Self-pay | Admitting: Hematology and Oncology

## 2021-03-28 ENCOUNTER — Encounter (HOSPITAL_COMMUNITY): Payer: Self-pay | Admitting: Gastroenterology

## 2021-03-28 DIAGNOSIS — C250 Malignant neoplasm of head of pancreas: Secondary | ICD-10-CM

## 2021-03-28 DIAGNOSIS — E440 Moderate protein-calorie malnutrition: Secondary | ICD-10-CM

## 2021-03-28 DIAGNOSIS — E1165 Type 2 diabetes mellitus with hyperglycemia: Secondary | ICD-10-CM

## 2021-03-28 DIAGNOSIS — K8689 Other specified diseases of pancreas: Secondary | ICD-10-CM

## 2021-03-28 LAB — SURGICAL PATHOLOGY

## 2021-03-28 LAB — COMPREHENSIVE METABOLIC PANEL
ALT: 69 U/L — ABNORMAL HIGH (ref 0–44)
AST: 92 U/L — ABNORMAL HIGH (ref 15–41)
Albumin: 2.5 g/dL — ABNORMAL LOW (ref 3.5–5.0)
Alkaline Phosphatase: 684 U/L — ABNORMAL HIGH (ref 38–126)
Anion gap: 5 (ref 5–15)
BUN: 11 mg/dL (ref 6–20)
CO2: 26 mmol/L (ref 22–32)
Calcium: 8.4 mg/dL — ABNORMAL LOW (ref 8.9–10.3)
Chloride: 100 mmol/L (ref 98–111)
Creatinine, Ser: 0.53 mg/dL — ABNORMAL LOW (ref 0.61–1.24)
GFR, Estimated: >60 mL/min (ref >60)
Glucose, Bld: 345 mg/dL — ABNORMAL HIGH (ref 70–99)
Potassium: 4.1 mmol/L (ref 3.5–5.1)
Sodium: 131 mmol/L — ABNORMAL LOW (ref 135–145)
Total Bilirubin: 6.9 mg/dL — ABNORMAL HIGH (ref 0.3–1.2)
Total Protein: 7 g/dL (ref 6.5–8.1)

## 2021-03-28 LAB — GLUCOSE, CAPILLARY
Glucose-Capillary: 274 mg/dL — ABNORMAL HIGH (ref 70–99)
Glucose-Capillary: 326 mg/dL — ABNORMAL HIGH (ref 70–99)
Glucose-Capillary: 455 mg/dL — ABNORMAL HIGH (ref 70–99)
Glucose-Capillary: 410 mg/dL — ABNORMAL HIGH (ref 70–99)
Glucose-Capillary: 443 mg/dL — ABNORMAL HIGH (ref 70–99)
Glucose-Capillary: 377 mg/dL — ABNORMAL HIGH (ref 70–99)

## 2021-03-28 MED ORDER — INSULIN LISPRO 200 UNIT/ML ~~LOC~~ SOPN
7.0000 [IU] | PEN_INJECTOR | Freq: Three times a day (TID) | SUBCUTANEOUS | 3 refills | Status: DC
Start: 2021-03-28 — End: 2021-05-08
  Filled 2021-03-28: qty 6, 28d supply, fill #0
  Filled 2021-04-26: qty 6, 28d supply, fill #1

## 2021-03-28 MED ORDER — FOLIC ACID 1 MG PO TABS: 1.0000 mg | ORAL_TABLET | Freq: Every morning | ORAL | Status: AC

## 2021-03-28 MED ORDER — OMEPRAZOLE 40 MG PO CPDR: 40.0000 mg | DELAYED_RELEASE_CAPSULE | Freq: Every morning | ORAL | Status: DC

## 2021-03-28 MED ORDER — INSULIN GLARGINE 100 UNIT/ML ~~LOC~~ SOLN: 20.0000 [IU] | Freq: Two times a day (BID) | SUBCUTANEOUS | 11 refills | Status: DC

## 2021-03-28 MED ORDER — INSULIN ASPART 100 UNIT/ML IJ SOLN
4.0000 [IU] | Freq: Three times a day (TID) | INTRAMUSCULAR | Status: DC
  Administered 2021-03-28 (×2): 4 [IU] via SUBCUTANEOUS

## 2021-03-28 MED ORDER — INSULIN ASPART 100 UNIT/ML IJ SOLN
10.0000 [IU] | Freq: Once | INTRAMUSCULAR | Status: AC
  Administered 2021-03-28: 10 [IU] via SUBCUTANEOUS

## 2021-03-28 MED ORDER — INSULIN GLARGINE-YFGN 100 UNIT/ML ~~LOC~~ SOLN
25.0000 [IU] | Freq: Two times a day (BID) | SUBCUTANEOUS | Status: DC
  Administered 2021-03-28: 25 [IU] via SUBCUTANEOUS
  Filled 2021-03-28 (×2): qty 0.25

## 2021-03-28 MED ORDER — INSULIN LISPRO (1 UNIT DIAL) 100 UNIT/ML (KWIKPEN)
0.0000 [IU] | PEN_INJECTOR | Freq: Three times a day (TID) | SUBCUTANEOUS | 11 refills | Status: DC
  Filled 2021-03-28: qty 9, 33d supply, fill #0
  Filled 2021-04-26: qty 15, 56d supply, fill #0

## 2021-03-28 MED ORDER — PANCRELIPASE (LIP-PROT-AMYL) 12000-38000 UNITS PO CPEP
72000.0000 [IU] | ORAL_CAPSULE | Freq: Three times a day (TID) | ORAL | Status: DC
  Administered 2021-03-28: 72000 [IU] via ORAL
  Filled 2021-03-28: qty 6

## 2021-03-28 MED ORDER — LOSARTAN POTASSIUM 50 MG PO TABS: 50.0000 mg | ORAL_TABLET | Freq: Every day | ORAL | Status: DC

## 2021-03-28 MED ORDER — PANCRELIPASE (LIP-PROT-AMYL) 12000-38000 UNITS PO CPEP: 36000.0000 [IU] | ORAL_CAPSULE | ORAL | Status: DC

## 2021-03-28 MED ORDER — INSULIN GLARGINE-YFGN 100 UNIT/ML ~~LOC~~ SOLN
30.0000 [IU] | Freq: Two times a day (BID) | SUBCUTANEOUS | Status: DC
  Filled 2021-03-28: qty 0.3

## 2021-03-28 MED ORDER — PROCHLORPERAZINE MALEATE 10 MG PO TABS
10.0000 mg | ORAL_TABLET | Freq: Four times a day (QID) | ORAL | 0 refills | Status: DC | PRN
Start: 2021-03-28 — End: 2021-05-08

## 2021-03-28 MED ORDER — DULOXETINE HCL 60 MG PO CPEP: 60.0000 mg | ORAL_CAPSULE | Freq: Every morning | ORAL | Status: DC

## 2021-03-28 NOTE — Progress Notes (Addendum)
Progress Note  Chief Complaint:   jaundice, pancreatic cancer     Attending physician's note   I have taken an interval history, reviewed the chart and examined the patient. I agree with the Advanced Practitioner's note, impression and recommendations.   S/p ERCP yesterday, noted migrated plastic stent in the biliary duct, was exchanged with covered metal stent  Bilirubin and LFT trending down Creon for pancreatic insufficiency  Will need adjustment of insulin for better glycemic control  Follow-up with oncology Repeat LFT as outpatient in 1 week on discharge  Please call with any questions, GI available if needed  I have spent 25 minutes of patient care (this includes precharting, chart review, review of results, face-to-face time used for counseling as well as treatment plan and follow-up. The patient was provided an opportunity to ask questions and all were answered. The patient agreed with the plan and demonstrated an understanding of the instructions.  Damaris Hippo , MD [phone]    ASSESSMENT AND PLAN   # 60 yo male with newly diagnosed HOP adenocarcinoma , stage IIB. Plan is for neoadjuvant chemotherapy then possible resection. Currently admitted with worsening LFTs/ jaundice. ERCP yesterday >> distal CBD stricture, migrated plastic stent which was removed and metal stent placed.  -Tbili and alk phos down overnight from 12 to 6.9 and 709 to 684 respectively. WBC 10.8 --Stable for discharge from GI standpoint. Our office will be in touch with him if Dr. Havery Moros wants outpatient labs / follow up   # Chronic loose stool ( since March 2022) despite pancreatic enzyme replacement. He carries a diagnosis of exocrine pancreatic insufficiency.  --Creon 72K with meals and 36 k with snacks   #  ? Cirrhosis based on our office notes.  No evidence for decompensation. INR normal.  # DM, uncontrolled blood sugars. Patient had stopped his insulin at home.      DIAGNOSTIC STUDIES   03/27/21 ERCP -Distal CBD stricture s/p insertion of covered metal biliary stent. -Previous stent had migrated into the CBD s/p stent extraction.  SUBJECTIVE    No abdominal pain, tolerated solids    OBJECTIVE      Scheduled inpatient medications:   (feeding supplement) PROSource Plus  30 mL Oral BID BM   Chlorhexidine Gluconate Cloth  6 each Topical Daily   DULoxetine  60 mg Oral Daily   feeding supplement  1 Container Oral BID BM   folic acid  1 mg Oral Daily   indomethacin  100 mg Rectal Once   insulin aspart  0-15 Units Subcutaneous TID WC   insulin aspart  4 Units Subcutaneous TID WC   insulin glargine-yfgn  25 Units Subcutaneous BID   lactose free nutrition  237 mL Oral BID BM   lipase/protease/amylase  36,000 Units Oral TID AC   losartan  50 mg Oral Daily   mirtazapine  30 mg Oral QHS   multivitamin with minerals  1 tablet Oral Daily   nicotine  21 mg Transdermal Daily   pantoprazole  40 mg Oral Daily   Continuous inpatient infusions:   sodium chloride 125 mL/hr at 03/28/21 0142   PRN inpatient medications: albuterol, morphine injection, ondansetron **OR** ondansetron (ZOFRAN) IV  Vital signs in last 24 hours: Temp:  [97.6 F (36.4 C)-99.2 F (37.3 C)] 97.7 F (36.5 C) (07/29 0456) Pulse Rate:  [60-87] 63 (07/29 0456) Resp:  [15-20] 20 (07/29 0456) BP: (129-165)/(68-93) 141/75 (07/29 0456) SpO2:  [98 %-100 %] 99 % (07/29 0456)  Last BM Date: 03/28/21  Intake/Output Summary (Last 24 hours) at 03/28/2021 8871 Last data filed at 03/28/2021 0600 Gross per 24 hour  Intake 2773.96 ml  Output 2350 ml  Net 423.96 ml     Physical Exam:  General: Alert male in NAD Heart:  Regular rate and rhythm. No lower extremity edema Pulmonary: Normal respiratory effort Abdomen: Soft, nondistended, nontender. Normal bowel sounds.  Neurologic: Alert and oriented Psych: Pleasant. Cooperative.   Filed Weights   03/26/21 1047 03/26/21 2047   Weight: 66.7 kg 71.1 kg    Intake/Output from previous day: 07/28 0701 - 07/29 0700 In: 2774 [P.O.:390; I.V.:2184; IV Piggyback:200] Out: 2350 [Urine:2350] Intake/Output this shift: No intake/output data recorded.    Lab Results: Recent Labs    03/26/21 1048 03/27/21 0511  WBC 10.2 10.8*  HGB 11.9* 11.0*  HCT 34.5* 32.2*  PLT 230 230   BMET Recent Labs    03/26/21 1048 03/27/21 0511 03/28/21 0520  NA 127* 130* 131*  K 4.6 3.8 4.1  CL 91* 98 100  CO2 26 25 26   GLUCOSE 559* 296* 345*  BUN 6 10 11   CREATININE 0.46* 0.33* 0.53*  CALCIUM 8.8* 8.4* 8.4*   LFT Recent Labs    03/28/21 0520  PROT 7.0  ALBUMIN 2.5*  AST 92*  ALT 69*  ALKPHOS 684*  BILITOT 6.9*   PT/INR Recent Labs    03/26/21 1048  LABPROT 15.2  INR 1.2   Hepatitis Panel No results for input(s): HEPBSAG, HCVAB, HEPAIGM, HEPBIGM in the last 72 hours.  DG ERCP BILIARY & PANCREATIC DUCTS  Result Date: 03/27/2021 CLINICAL DATA:  60 year old male with biliary stricture, undergoing ERCP. EXAM: ERCP TECHNIQUE: Multiple spot images obtained with the fluoroscopic device and submitted for interpretation post-procedure. FLUOROSCOPY TIME:  Fluoroscopy Time:  40 seconds Radiation Exposure Index (if provided by the fluoroscopic device): Not reported Number of Acquired Spot Images: 7 COMPARISON:  CT and pelvis, 02/26/2021. FINDINGS: Multiple AP plain spot radiographs of the epigastrium performed by C-arm during ERCP. Images demonstrated a endoscopy, common bile duct cannulation, retrograde cholangiogram, plastic stent removal and and biliary stent placement. No discrete filling defect is appreciated IMPRESSION: Intra procedure fluoroscopic imaging for ERCP. These images were submitted for radiologic interpretation only. Please see the procedural report for the amount of contrast and the fluoroscopy time utilized. Electronically Signed   By: Michaelle Birks MD   On: 03/27/2021 14:57        Principal Problem:    Hyperglycemia due to diabetes mellitus ([place]) Active Problems:   Tobacco use disorder   Chronic hepatitis C without hepatic coma ([facility])   Hyponatremia   Depression   Pancreatic cancer ([place])   Malignant neoplasm of head of pancreas ([place])   Malnutrition of moderate degree     LOS: 2 days   Tye Savoy ,NP 03/28/2021, 9:22 AM

## 2021-03-28 NOTE — Progress Notes (Signed)
Inpatient Diabetes Program Recommendations  AACE/ADA: New Consensus Statement on Inpatient Glycemic Control (2015)  Target Ranges:  Prepandial:   less than 140 mg/dL      Peak postprandial:   less than 180 mg/dL (1-2 hours)      Critically ill patients:  140 - 180 mg/dL   Lab Results  Component Value Date   GLUCAP 455 (H) 03/28/2021   HGBA1C 10.3 (H) 01/28/2021    Review of Glycemic Control  Diabetes history: DM2 Outpatient Diabetes medications: Lantus 30 BID, glipizide 5 mg QAM Current orders for Inpatient glycemic control: Semglee 25 mg BID, Novolog 0-15 units TID with meals + 4 units TID  On Boost between meals HgbA1C - 10.3%  Spoke with pt about his blood sugar control at home. Pt states he was having frequent hypos and continued to decrease his Lantus. He was previously on 42 units BID. Also said he stopped taking his Novolog d/t stomach hurting all the time. He's not checking blood sugars like he should. Instructed to check at least 3x/day and needs to f/u with PCP  if blood sugars < 70 mg/dL. Discussed hypoglycemia s/s and treatment.   Inpatient Diabetes Program Recommendations:    For discharge: (prevent hypoglycemia)  Lantus 20 units BID Humalog 7 units TID if eating regular meal. If only eating 50%, take 3 units.  If blood sugars consistently > 200 mg/dL, call PCP for insulin titration.  Secure text to MD.  Thank you. Lorenda Peck, RD, LDN, CDE Inpatient Diabetes Coordinator [phone]

## 2021-03-28 NOTE — Plan of Care (Signed)
  Problem: Coping: Goal: Level of anxiety will decrease Outcome: Completed/Met

## 2021-03-28 NOTE — Plan of Care (Signed)
  Problem: Education: Goal: Knowledge of General Education information will improve Description: Including pain rating scale, medication(s)/side effects and non-pharmacologic comfort measures 03/28/2021 1654 by Lennie Hummer, RN Outcome: Adequate for Discharge 03/28/2021 1647 by Lennie Hummer, RN Outcome: Progressing   Problem: Health Behavior/Discharge Planning: Goal: Ability to manage health-related needs will improve Outcome: Adequate for Discharge   Problem: Clinical Measurements: Goal: Ability to maintain clinical measurements within normal limits will improve 03/28/2021 1654 by Lennie Hummer, RN Outcome: Adequate for Discharge 03/28/2021 1647 by Lennie Hummer, RN Outcome: Progressing Goal: Will remain free from infection Outcome: Adequate for Discharge Goal: Diagnostic test results will improve Outcome: Adequate for Discharge Goal: Respiratory complications will improve Outcome: Adequate for Discharge Goal: Cardiovascular complication will be avoided Outcome: Adequate for Discharge   Problem: Nutrition: Goal: Adequate nutrition will be maintained Outcome: Adequate for Discharge   Problem: Elimination: Goal: Will not experience complications related to bowel motility Outcome: Adequate for Discharge Goal: Will not experience complications related to urinary retention Outcome: Adequate for Discharge   Problem: Pain Managment: Goal: General experience of comfort will improve Outcome: Adequate for Discharge   Problem: Safety: Goal: Ability to remain free from injury will improve Outcome: Adequate for Discharge   Problem: Skin Integrity: Goal: Risk for impaired skin integrity will decrease Outcome: Adequate for Discharge

## 2021-03-28 NOTE — Discharge Summary (Signed)
Physician Discharge Summary   Patient ID: [name] [name] MRN: [id number] DOB/AGE: [birth year] 60 y.o.  Admit date: 03/26/2021 Discharge date: 03/28/2021  Primary Care Physician:  [name], NP   Recommendations for Outpatient Follow-up:  Follow up with PCP in 1-2 weeks Referral to endocrinology sent for uncontrolled hyperglycemia in the setting of pancreatic CA Outpatient follow-up of LFTs in 1 week  Home Health: None Equipment/Devices:   Discharge Condition: stable  CODE STATUS: FULL  Diet recommendation: Carb modified diet   Discharge Diagnoses:     Hyperglycemia due to diabetes mellitus ([place])  Chronic hepatitis C without hepatic coma ([place])  Depression  Malignant neoplasm of head of pancreas ([place])  Pancreatic cancer ([place])  Tobacco use disorder  Hyponatremia Malnutrition of moderate degree likely due to malignancy  Consults: Gastroenterology    Allergies:   Allergies  Allergen Reactions   Bee Venom Anaphylaxis   Lactose Intolerance (Gi) Other (See Comments)    GI upset     DISCHARGE MEDICATIONS: Allergies as of 03/28/2021       Reactions   Bee Venom Anaphylaxis   Lactose Intolerance (gi) Other (See Comments)   GI upset        Medication List     STOP taking these medications    acetaminophen 325 MG tablet Commonly known as: TYLENOL   aspirin EC 81 MG tablet   atorvastatin 40 MG tablet Commonly known as: LIPITOR   glipiZIDE 5 MG tablet Commonly known as: GLUCOTROL   ibuprofen 800 MG tablet Commonly known as: ADVIL   meloxicam 7.5 MG tablet Commonly known as: MOBIC       TAKE these medications    albuterol 108 (90 Base) MCG/ACT inhaler Commonly known as: Ventolin HFA Inhale 2 puffs into the lungs every 6 (six) hours as needed for wheezing or shortness of breath.   DULoxetine 60 MG capsule Commonly known as: CYMBALTA Take 1 capsule (60 mg total) by mouth in the morning.   EPINEPHrine 0.3 mg/0.3 mL Soaj  injection Commonly known as: EPI-PEN Inject 0.3 mg into the muscle as needed for anaphylaxis.   folic acid 1 MG tablet Commonly known as: FOLVITE Take 1 tablet (1 mg total) by mouth in the morning.   HumaLOG KwikPen 200 UNIT/ML KwikPen Generic drug: insulin lispro Inject 7 Units into the skin 3 (three) times daily before meals.   insulin lispro 100 UNIT/ML KwikPen Commonly known as: HUMALOG Inject 0-9 Units into the skin 3 (three) times daily. Sliding scale CBG 70 - 120: 0 units CBG 121 - 150: 1 unit,  CBG 151 - 200: 2 units,  CBG 201 - 250: 3 units,  CBG 251 - 300: 5 units,  CBG 301 - 350: 7 units,  CBG 351 - 400: 9 units   CBG > 400: 9 units and notify your MD   insulin glargine 100 UNIT/ML injection Commonly known as: LANTUS Inject 0.2 mLs (20 Units total) into the skin 2 (two) times daily. What changed: how much to take   lidocaine-prilocaine cream Commonly known as: EMLA Apply 1 application topically as directed as needed.   lipase/protease/amylase 36000 UNITS Cpep capsule Commonly known as: CREON Take 2 capsules (72,000 Units total) by mouth 3 (three) times daily with meals. May also take 1 capsule (36,000 Units total) as needed (with snacks).   losartan 50 MG tablet Commonly known as: COZAAR Take 1 tablet (50 mg total) by mouth daily.   mirtazapine 30 MG tablet Commonly known as: REMERON Take  1 tablet (30 mg total) by mouth at bedtime.   omeprazole 40 MG capsule Commonly known as: PRILOSEC Take 1 capsule (40 mg total) by mouth in the morning.   ondansetron 8 MG tablet Commonly known as: ZOFRAN Take 1 tablet (8 mg total) by mouth every 8 (eight) hours as needed for nausea or vomiting.   oxyCODONE 5 MG immediate release tablet Commonly known as: Oxy IR/ROXICODONE Take 1-2 tablets (5-10 mg total) by mouth every 6 (six) hours as needed for severe pain.   prochlorperazine 10 MG tablet Commonly known as: COMPAZINE Take 1 tablet (10 mg total) by mouth every 6 (six)  hours as needed for nausea or vomiting.   Therems-M Tabs Take 1 tablet by mouth in the morning.   True Metrix Blood Glucose Test test strip Generic drug: glucose blood Use as instructed. Check blood glucose level by fingerstick 3-4 times per day.  E11.65   TRUEplus Lancets 28G Misc Use as instructed. Check blood glucose level by fingerstick 3-4 times per day.   E11.65   TRUEplus Pen Needles 32G X 4 MM Misc Generic drug: Insulin Pen Needle Use as instructed. Inject into the skin twice daily         Brief H and P: For complete details please refer to admission H and P, but in brief Patient is a 60 year old male with adenocarcinoma of the pancreas, stage IIb, DM, GERD, Barrett's esophagus presented to ED with worsening jaundice.  He was recently diagnosed with pancreatic CA, status post recent ERCP and placement of nonmetallic biliary stent on 6/30, scheduled to start chemo on 03/27/2021. Post insertion of the biliary stent, patient had blood work done which showed increased levels of his total bilirubin and worsening jaundice and was referred to ER by his gastroenterologist for admission for repeat ERCP. Also reports diarrhea which he states is clay colored In ED, alkaline phosphatase 778, AST 142, ALT 88, total bilirubin 11.7.  Albumin 2.5, lipase 14,    Hospital Course:  Hyperglycemia due to diabetes mellitus ([place]), pancreatic CA, noncompliance -Patient had reported that he stopped taking his Lantus for the last couple of days,Glipizide was held -Hemoglobin A1c 10.3 -Patient was not consistently taking his insulin due to hypoglycemia at home, initially was on 42 units twice daily, then decreased to 30 units twice daily however he was having occasional hypoglycemia, then stopped taking his NovoLog due to abdominal pain.  He has not been checking his blood sugars at home. -Diabetic coordinator consult obtained.  To avoid hypoglycemia decrease Lantus to 20 units twice daily and add  Humalog 7 units 3 times daily.  I will send a referral to endocrinology for follow-up outpatient.  Patient was also counseled strongly to check his blood sugars consistently in a.m. and with meals  -Glipizide discontinued  Pancreatic adenocarcinoma, transaminitis, obstructive jaundice -Status post ERCP and nonmetallic biliary stent on 6/30 -Underwent repeat ERCP on 7/28.  Previous stent had migrated into the CBD, extracted.  Distal CBD stricture with insertion of covered metal biliary stent. -LFTs now improving. -Will need follow-up appointment with Dr. Lorenso Courier for initiating chemo.   Chronic diarrhea -Patient has been on pancreatic enzyme replacement, will continue -Was C. difficile negative in March   Hyponatremia -Likely pseudohyponatremia due to hyperglycemia -Improving   Essential hypertension Continue losartan   Depression -Continue Cymbalta, Remeron   Nicotine dependence Counseled on smoking cessation  Day of Discharge S: No acute complaints however CBGs have been running high.  Hoping to go home today.  BP (!) 153/92 (BP Location: Right Arm)   Pulse 77   Temp 98.3 F (36.8 C) (Oral)   Resp 20   Ht '5\' 11"'$  (1.803 m)   Wt 71.1 kg   SpO2 99%   BMI 21.86 kg/m   Physical Exam: General: Alert and awake oriented x3 not in any acute distress. CVS: S1-S2 clear no murmur rubs or gallops Chest: clear to auscultation bilaterally, no wheezing rales or rhonchi Abdomen: soft nontender, nondistended, normal bowel sounds Extremities: no cyanosis, clubbing or edema noted bilaterally Neuro: no new deficits    Get Medicines reviewed and adjusted: Please take all your medications with you for your next visit with your Primary MD  Please request your Primary MD to go over all hospital tests and procedure/radiological results at the follow up. Please ask your Primary MD to get all Hospital records sent to his/her office.  If you experience worsening of your admission symptoms,  develop shortness of breath, life threatening emergency, suicidal or homicidal thoughts you must seek medical attention immediately by calling 911 or calling your MD immediately  if symptoms less severe.  You must read complete instructions/literature along with all the possible adverse reactions/side effects for all the Medicines you take and that have been prescribed to you. Take any new Medicines after you have completely understood and accept all the possible adverse reactions/side effects.   Do not drive when taking pain medications.   Do not take more than prescribed Pain, Sleep and Anxiety Medications  Special Instructions: If you have smoked or chewed Tobacco  in the last 2 yrs please stop smoking, stop any regular Alcohol  and or any Recreational drug use.  Wear Seat belts while driving.  Please note  You were cared for by a hospitalist during your hospital stay. Once you are discharged, your primary care physician will handle any further medical issues. Please note that NO REFILLS for any discharge medications will be authorized once you are discharged, as it is imperative that you return to your primary care physician (or establish a relationship with a primary care physician if you do not have one) for your aftercare needs so that they can reassess your need for medications and monitor your lab values.   The results of significant diagnostics from this hospitalization (including imaging, microbiology, ancillary and laboratory) are listed below for reference.      Procedures/Studies:  CT CHEST W CONTRAST  Result Date: 03/06/2021 CLINICAL DATA:  Pancreatic adenocarcinoma chest staging EXAM: CT CHEST WITH CONTRAST TECHNIQUE: Multidetector CT imaging of the chest was performed during intravenous contrast administration. CONTRAST:  23m OMNIPAQUE IOHEXOL 300 MG/ML  SOLN COMPARISON:  CT abdomen pelvis, 02/26/2021 FINDINGS: Cardiovascular: Aortic atherosclerosis. Normal heart size. Left  and right coronary artery calcifications. No pericardial effusion. Mediastinum/Nodes: No enlarged mediastinal, hilar, or axillary lymph nodes. Thyroid gland, trachea, and esophagus demonstrate no significant findings. Lungs/Pleura: Mild centrilobular and paraseptal emphysema. Background of very fine centrilobular pulmonary nodules, concentrated in the lung apices. Bandlike scarring of the left upper lobe. No pleural effusion or pneumothorax. Upper Abdomen: No acute abnormality. Musculoskeletal: No chest wall mass or suspicious bone lesions identified. IMPRESSION: 1. No evidence of metastatic disease in the chest. 2. Background of very fine centrilobular pulmonary nodules, concentrated in the lung apices, consistent with smoking-related respiratory bronchiolitis. 3. Emphysema. 4. Coronary artery disease. Aortic Atherosclerosis (ICD10-I70.0) and Emphysema (ICD10-J43.9). Electronically Signed   By: AEddie CandleM.D.   On: 03/06/2021 14:31   DG ERCP  Result  Date: 02/27/2021 CLINICAL DATA:  Pancreatic lesion. EXAM: ERCP TECHNIQUE: Multiple spot images obtained with the fluoroscopic device and submitted for interpretation post-procedure. FLUOROSCOPY TIME:  Fluoroscopy Time:  1 minute, 50 seconds COMPARISON:  CT abdomen 02/26/2021 FINDINGS: Wire was advanced into the biliary system and retrograde cholangiogram was obtained. Contrast fills a dilated proximal common bile duct. There appears to be a severe distal common bile duct stricture. Minor filling of the intrahepatic bile ducts. Placement of a nonmetallic biliary stent. IMPRESSION: Distal common bile duct obstruction or stricture. Placement of a nonmetallic biliary stent. These images were submitted for radiologic interpretation only. Please see the procedural report for the amount of contrast and the fluoroscopy time utilized. Electronically Signed   By: Markus Daft M.D.   On: 02/27/2021 16:51   DG ERCP BILIARY & PANCREATIC DUCTS  Result Date:  03/27/2021 CLINICAL DATA:  60 year old male with biliary stricture, undergoing ERCP. EXAM: ERCP TECHNIQUE: Multiple spot images obtained with the fluoroscopic device and submitted for interpretation post-procedure. FLUOROSCOPY TIME:  Fluoroscopy Time:  40 seconds Radiation Exposure Index (if provided by the fluoroscopic device): Not reported Number of Acquired Spot Images: 7 COMPARISON:  CT and pelvis, 02/26/2021. FINDINGS: Multiple AP plain spot radiographs of the epigastrium performed by C-arm during ERCP. Images demonstrated a endoscopy, common bile duct cannulation, retrograde cholangiogram, plastic stent removal and and biliary stent placement. No discrete filling defect is appreciated IMPRESSION: Intra procedure fluoroscopic imaging for ERCP. These images were submitted for radiologic interpretation only. Please see the procedural report for the amount of contrast and the fluoroscopy time utilized. Electronically Signed   By: Michaelle Birks MD   On: 03/27/2021 14:57   IR IMAGING GUIDED PORT INSERTION  Result Date: 03/21/2021 INDICATION: 60 year old male with pancreatic cancer. He presents for port catheter placement to establish durable venous access. EXAM: IMPLANTED PORT A CATH PLACEMENT WITH ULTRASOUND AND FLUOROSCOPIC GUIDANCE MEDICATIONS: None ANESTHESIA/SEDATION: 100 mcg fentanyl administered for pain control. This is not moderate sedation. FLUOROSCOPY TIME:  0 minutes, 12 seconds (2 mGy) COMPLICATIONS: None immediate. PROCEDURE: The right neck and chest was prepped with chlorhexidine, and draped in the usual sterile fashion using maximum barrier technique (cap and mask, sterile gown, sterile gloves, large sterile sheet, hand hygiene and cutaneous antiseptic). Local anesthesia was attained by infiltration with 1% lidocaine with epinephrine. Ultrasound demonstrated patency of the right internal jugular vein, and this was documented with an image. Under real-time ultrasound guidance, this vein was  accessed with a 21 gauge micropuncture needle and image documentation was performed. A small dermatotomy was made at the access site with an 11 scalpel. A 0.018" wire was advanced into the SVC and the access needle exchanged for a 57F micropuncture vascular sheath. The 0.018" wire was then removed and a 0.035" wire advanced into the IVC. An appropriate location for the subcutaneous reservoir was selected below the clavicle and an incision was made through the skin and underlying soft tissues. The subcutaneous tissues were then dissected using a combination of blunt and sharp surgical technique and a pocket was formed. A single lumen power injectable portacatheter was then tunneled through the subcutaneous tissues from the pocket to the dermatotomy and the port reservoir placed within the subcutaneous pocket. The venous access site was then serially dilated and a peel away vascular sheath placed over the wire. The wire was removed and the port catheter advanced into position under fluoroscopic guidance. The catheter tip is positioned in the superior cavoatrial junction. This was documented with a  spot image. The portacatheter was then tested and found to flush and aspirate well. The port was flushed with saline followed by 100 units/mL heparinized saline. The pocket was then closed in two layers using first subdermal inverted interrupted absorbable sutures followed by a running subcuticular suture. The epidermis was then sealed with Dermabond. The dermatotomy at the venous access site was also closed with Dermabond. IMPRESSION: Successful placement of a right IJ approach Power Port with ultrasound and fluoroscopic guidance. The catheter is ready for use. Electronically Signed   By: Jacqulynn Cadet M.D.   On: 03/21/2021 10:31      LAB RESULTS: Basic Metabolic Panel: Recent Labs  Lab 03/27/21 0511 03/28/21 0520  NA 130* 131*  K 3.8 4.1  CL 98 100  CO2 25 26  GLUCOSE 296* 345*  BUN 10 11  CREATININE  0.33* 0.53*  CALCIUM 8.4* 8.4*   Liver Function Tests: Recent Labs  Lab 03/27/21 0511 03/28/21 0520  AST 143* 92*  ALT 81* 69*  ALKPHOS 709* 684*  BILITOT 12.0* 6.9*  PROT 6.6 7.0  ALBUMIN 2.4* 2.5*   Recent Labs  Lab 03/26/21 1048  LIPASE 14   No results for input(s): AMMONIA in the last 168 hours. CBC: Recent Labs  Lab 03/26/21 1048 03/27/21 0511  WBC 10.2 10.8*  NEUTROABS 7.5  --   HGB 11.9* 11.0*  HCT 34.5* 32.2*  MCV 93.2 95.3  PLT 230 230   Cardiac Enzymes: No results for input(s): CKTOTAL, CKMB, CKMBINDEX, TROPONINI in the last 168 hours. BNP: Invalid input(s): POCBNP CBG: Recent Labs  Lab 03/28/21 1330 03/28/21 1420  GLUCAP 410* 443*       Disposition and Follow-up: Discharge Instructions     Ambulatory referral to Endocrinology   Complete by: As directed    Uncontrolled diabetes in the setting of pancreatic CA.   Diet Carb Modified   Complete by: As directed    Discharge instructions   Complete by: As directed    It is VERY IMPORTANT that you follow up with a PCP on a regular basis.  Check your blood glucoses before each meal and at bedtime and maintain a log of your readings.  Bring this log with you when you follow up with your PCP so that he or she can adjust your insulin at your follow up visit.   Increase activity slowly   Complete by: As directed         DISPOSITION: Home   DISCHARGE FOLLOW-UP  Follow-up Information     [name], NP. Schedule an appointment as soon as possible for a visit in 2 week(s).   Specialty: Nurse Practitioner Why: for hospital follow-up Contact information: [place] [address] [phone]         [facility]. Call on 03/31/2021.   Specialty: Internal Medicine Why: Referral sent for appointment Contact information: [address] [place] [phone]                 Time coordinating discharge:  35  minutes  Signed:   Estill Cotta M.D. [facility] 03/28/2021, 3:17 PM

## 2021-03-28 NOTE — Plan of Care (Signed)
  Problem: Education: Goal: Knowledge of General Education information will improve Description: Including pain rating scale, medication(s)/side effects and non-pharmacologic comfort measures Outcome: Progressing   Problem: Clinical Measurements: Goal: Ability to maintain clinical measurements within normal limits will improve Outcome: Progressing   

## 2021-03-29 ENCOUNTER — Inpatient Hospital Stay: Payer: Medicaid Other

## 2021-03-31 ENCOUNTER — Encounter: Payer: Self-pay | Admitting: Hematology and Oncology

## 2021-03-31 ENCOUNTER — Telehealth: Payer: Self-pay

## 2021-03-31 ENCOUNTER — Telehealth: Payer: Self-pay | Admitting: Hematology and Oncology

## 2021-03-31 NOTE — Telephone Encounter (Signed)
Scheduled appt per 7/29 sch msg. Pt aware.  

## 2021-03-31 NOTE — Telephone Encounter (Signed)
Transition Care Management Follow-up Telephone Call Date of discharge and from where: 03/28/2021, [facility]  How have you been since you were released from the hospital? He said that he still experiences some pain but is feeling better overall. Any questions or concerns? No  Items Reviewed: Did the pt receive and understand the discharge instructions provided? Yes  Medications obtained and verified? Yes  - he said that he has everything but the creon and he is working with the oncology nurse navigator to complete application for patient assistance. He didn't have any questions about his med regime.  Other? No  Any new allergies since your discharge? No  Do you have support at home?  He said that his mother helps him pay for renting a room with bathroom.   Home Care and Equipment/Supplies: Were home health services ordered? no If so, what is the name of the agency? N/a  Has the agency set up a time to come to the patient's home? not applicable Were any new equipment or medical supplies ordered?  No What is the name of the medical supply agency? N/a Were you able to get the supplies/equipment? not applicable Do you have any questions related to the use of the equipment or supplies? No  He has a glucometer with memory to track blood sugars.    Functional Questionnaire: (I = Independent and D = Dependent) ADLs: independent. Has cane to use with ambulation but stated that he tends to walk using walls for support    Follow up appointments reviewed:  PCP Hospital f/u appt confirmed? Yes  Scheduled to see [name], NP on 04/07/2021 @ 1050. [facility] f/u appt confirmed? Yes  Scheduled to see oncology on 04/01/2021.   Are transportation arrangements needed? No  If their condition worsens, is the pt aware to call PCP or go to the Emergency Dept.? Yes Was the patient provided with contact information for the PCP's office or ED? Yes Was to pt encouraged to call back with  questions or concerns? Yes

## 2021-04-01 ENCOUNTER — Telehealth: Payer: Self-pay

## 2021-04-01 ENCOUNTER — Inpatient Hospital Stay: Payer: Medicaid Other | Attending: Hematology and Oncology

## 2021-04-01 ENCOUNTER — Other Ambulatory Visit: Payer: Self-pay | Admitting: Hematology and Oncology

## 2021-04-01 ENCOUNTER — Inpatient Hospital Stay (HOSPITAL_BASED_OUTPATIENT_CLINIC_OR_DEPARTMENT_OTHER): Payer: Medicaid Other | Admitting: Hematology and Oncology

## 2021-04-01 ENCOUNTER — Other Ambulatory Visit: Payer: Self-pay

## 2021-04-01 VITALS — BP 146/76 | HR 85 | Temp 98.5°F | Resp 17 | Wt 155.6 lb

## 2021-04-01 DIAGNOSIS — K8689 Other specified diseases of pancreas: Secondary | ICD-10-CM

## 2021-04-01 DIAGNOSIS — C250 Malignant neoplasm of head of pancreas: Secondary | ICD-10-CM | POA: Insufficient documentation

## 2021-04-01 DIAGNOSIS — E119 Type 2 diabetes mellitus without complications: Secondary | ICD-10-CM | POA: Insufficient documentation

## 2021-04-01 DIAGNOSIS — Z79899 Other long term (current) drug therapy: Secondary | ICD-10-CM | POA: Diagnosis not present

## 2021-04-01 DIAGNOSIS — Z5111 Encounter for antineoplastic chemotherapy: Secondary | ICD-10-CM | POA: Diagnosis not present

## 2021-04-01 DIAGNOSIS — Z794 Long term (current) use of insulin: Secondary | ICD-10-CM | POA: Diagnosis not present

## 2021-04-01 LAB — LACTATE DEHYDROGENASE: LDH: 179 U/L (ref 98–192)

## 2021-04-01 LAB — CMP (CANCER CENTER ONLY)
ALT: 37 U/L (ref 0–44)
AST: 50 U/L — ABNORMAL HIGH (ref 15–41)
Albumin: 2.5 g/dL — ABNORMAL LOW (ref 3.5–5.0)
Alkaline Phosphatase: 433 U/L — ABNORMAL HIGH (ref 38–126)
Anion gap: 7 (ref 5–15)
BUN: 5 mg/dL — ABNORMAL LOW (ref 6–20)
CO2: 27 mmol/L (ref 22–32)
Calcium: 8.8 mg/dL — ABNORMAL LOW (ref 8.9–10.3)
Chloride: 98 mmol/L (ref 98–111)
Creatinine: 0.71 mg/dL (ref 0.61–1.24)
GFR, Estimated: >60 mL/min (ref >60)
Glucose, Bld: 309 mg/dL — ABNORMAL HIGH (ref 70–99)
Potassium: 4.1 mmol/L (ref 3.5–5.1)
Sodium: 132 mmol/L — ABNORMAL LOW (ref 135–145)
Total Bilirubin: 4.9 mg/dL (ref 0.3–1.2)
Total Protein: 7.4 g/dL (ref 6.5–8.1)

## 2021-04-01 LAB — CBC WITH DIFFERENTIAL (CANCER CENTER ONLY)
Abs Immature Granulocytes: 0.05 10*3/uL (ref 0.00–0.07)
Basophils Absolute: 0.1 10*3/uL (ref 0.0–0.1)
Basophils Relative: 1 %
Eosinophils Absolute: 0.3 10*3/uL (ref 0.0–0.5)
Eosinophils Relative: 2 %
HCT: 34.3 % — ABNORMAL LOW (ref 39.0–52.0)
Hemoglobin: 11.8 g/dL — ABNORMAL LOW (ref 13.0–17.0)
Immature Granulocytes: 0 %
Lymphocytes Relative: 15 %
Lymphs Abs: 1.9 10*3/uL (ref 0.7–4.0)
MCH: 32.4 pg (ref 26.0–34.0)
MCHC: 34.4 g/dL (ref 30.0–36.0)
MCV: 94.2 fL (ref 80.0–100.0)
Monocytes Absolute: 1.5 10*3/uL — ABNORMAL HIGH (ref 0.1–1.0)
Monocytes Relative: 12 %
Neutro Abs: 9.3 10*3/uL — ABNORMAL HIGH (ref 1.7–7.7)
Neutrophils Relative %: 70 %
Platelet Count: 347 10*3/uL (ref 150–400)
RBC: 3.64 MIL/uL — ABNORMAL LOW (ref 4.22–5.81)
RDW: 14 % (ref 11.5–15.5)
WBC Count: 13.1 10*3/uL — ABNORMAL HIGH (ref 4.0–10.5)
nRBC: 0 % (ref 0.0–0.2)

## 2021-04-01 LAB — FERRITIN: Ferritin: 116 ng/mL (ref 24–336)

## 2021-04-01 LAB — IRON AND TIBC
Iron: 38 ug/dL — ABNORMAL LOW (ref 45–182)
Saturation Ratios: 11 % — ABNORMAL LOW (ref 17.9–39.5)
TIBC: 352 ug/dL (ref 250–450)
UIBC: 314 ug/dL

## 2021-04-01 NOTE — Telephone Encounter (Signed)
CRITICAL VALUE STICKER  CRITICAL VALUE: Bilirubin 4.9  RECEIVER (on-site recipient of call): Rodgerick Gilliand P. LPN  DATE & TIME NOTIFIED: 8/2 12:02 pm  MESSENGER (representative from lab): Delsa Sale   MD NOTIFIED: Dr. Lorenso Courier

## 2021-04-01 NOTE — Progress Notes (Signed)
[place] [phone]   [phone]  PROGRESS NOTE  Patient Care Team: [name], NP as PCP - General (Nurse Practitioner) Havery Moros, Carlota Raspberry, MD as Consulting Physician (Gastroenterology) Orson Slick, MD as Consulting Physician (Oncology) Royston Bake, RN as Registered Nurse  Hematological/Oncological History # Adenocarcinoma of the Pancreas, Stage IIB 02/26/2021: patient seen by GI for jaundice and pancreatic mass causing CBD obstruction. CT abdomen showed pancreatic head mass (4.4 x 3.7 cm) and enlarged portocaval lymph nodes. 02/27/2021: ERCP with placement of a nonmetallic biliary stent. FNA of pancreatic head showed malignant cells consistent with pancreatic adenocarcinoma. 03/05/2021: CT Chest showed no evidence of metastatic disease in the chest. 03/12/2021: establish care with Dr. Lorenso Courier 04/09/2021: intended Cycle 1 Day 1 of FOLFIRINOX  Interval History:  [name] [name] 60 y.o. male with medical history significant for borderline resectable pancreatic cancer presents for a follow up visit. The patient's last visit was on 03/12/2021. In the interim since the last visit he was admitted to the hospital with blockage of his biliary stent.  He underwent repeat ERCP on 03/27/2021.  On exam today [name] is accompanied by his mom. He reports he has been well since his discharge from the hospital.  His jaundice has decreased and overall he feels well.  He is continuing to have some diarrhea and difficulty obtaining his Creon medication.  Fortunately he is not having any nausea or vomiting.  He does continue to have discomfort predominantly in his lower stomach around his hips.  He does endorse today having a history of 1 abnormal gene for hemochromatosis.  He currently denies any fevers, chills, sweats, shortness of breath, or chest pain.  He notes he is willing and able to start chemotherapy treatment as soon as is feasible.  The bulk of  our discussion focused on the chemotherapy treatment and assuring he had everything he needed to start treatment next week.  MEDICAL HISTORY:  Past Medical History:  Diagnosis Date   Barrett's esophagus dx 2016   Bronchitis    Chronic hepatitis C without hepatic coma ([place]) 10/31/2014   Depression    Diabetes ([place])    ED (erectile dysfunction)    GERD (gastroesophageal reflux disease)    Hepatitis C    Hiatal hernia 10/2014   3cm   Neuropathy    S/P transmetatarsal amputation of foot, left ([place]) 11/28/2020    SURGICAL HISTORY: Past Surgical History:  Procedure Laterality Date   AMPUTATION Left 11/15/2020   Procedure: LEFT TRANSMETATARSALS AMPUTATION;  Surgeon: Newt Minion, MD;  Location: [facility];  Service: Orthopedics;  Laterality: Left;   APPENDECTOMY     BILIARY STENT PLACEMENT N/A 02/27/2021   Procedure: BILIARY STENT PLACEMENT;  Surgeon: Milus Banister, MD;  [facility]: [facility];  Service: Endoscopy;  Laterality: N/A;   BILIARY STENT PLACEMENT N/A 03/27/2021   Procedure: BILIARY STENT PLACEMENT;  Surgeon: Jackquline Denmark, MD;  Location: [facility];  Service: Endoscopy;  Laterality: N/A;   BIOPSY  03/27/2021   Procedure: BIOPSY;  Surgeon: Jackquline Denmark, MD;  [facility];  Service: Endoscopy;;   COLONOSCOPY N/A 02/16/2014   Procedure: COLONOSCOPY;  Surgeon: Gatha Mayer, MD;  [facility];  Service: Endoscopy;  Laterality: N/A;   COLONOSCOPY     ENDOSCOPIC RETROGRADE CHOLANGIOPANCREATOGRAPHY (ERCP) WITH PROPOFOL N/A 02/27/2021   Procedure: ENDOSCOPIC RETROGRADE CHOLANGIOPANCREATOGRAPHY (ERCP) WITH PROPOFOL;  Surgeon: Milus Banister, MD;  [facility];  Service: Endoscopy;  Laterality: N/A;  ERCP N/A 03/27/2021   Procedure: ENDOSCOPIC RETROGRADE CHOLANGIOPANCREATOGRAPHY (ERCP);  Surgeon: Jackquline Denmark, MD;  Location: [facility];  Service: Endoscopy;  Laterality: N/A;   EUS N/A 02/27/2021   Procedure: UPPER ENDOSCOPIC ULTRASOUND (EUS) RADIAL;   Surgeon: Milus Banister, MD;  [facility];  Service: Endoscopy;  Laterality: N/A;   FINE NEEDLE ASPIRATION N/A 02/27/2021   Procedure: FINE NEEDLE ASPIRATION (FNA) LINEAR;  Surgeon: Milus Banister, MD;  [facility];  Service: Endoscopy;  Laterality: N/A;   IR IMAGING GUIDED PORT INSERTION  03/21/2021   SPHINCTEROTOMY  02/27/2021   Procedure: SPHINCTEROTOMY;  Surgeon: Milus Banister, MD;  [facility];  Service: Endoscopy;;   STENT REMOVAL  03/27/2021   Procedure: Lavell Islam REMOVAL;  Surgeon: Jackquline Denmark, MD;  [facility];  Service: Endoscopy;;   TONSILLECTOMY      SOCIAL HISTORY: Social History   Socioeconomic History   Marital status: Single    Spouse name: Not on file   Number of children: Not on file   Years of education: Not on file   Highest education level: Not on file  Occupational History   Not on file  Tobacco Use   Smoking status: Every Day    Packs/day: 0.50    Years: 35.00    Pack years: 17.50    Types: Cigarettes   Smokeless tobacco: Never  Vaping Use   Vaping Use: Never used  Substance and Sexual Activity   Alcohol use: Not Currently    Comment: previous   Drug use: No   Sexual activity: Not on file  Other Topics Concern   Not on file  Social History Narrative   Not on file   Social Determinants of Health   Financial Resource Strain: High Risk   Difficulty of Paying Living Expenses: Very hard  Food Insecurity: Not on file  Transportation Needs: Unmet Transportation Needs   Lack of Transportation (Medical): Yes   Lack of Transportation (Non-Medical): Yes  Physical Activity: Not on file  Stress: Not on file  Social Connections: Not on file  Intimate Partner Violence: Not on file    FAMILY HISTORY: Family History  Problem Relation Age of Onset   Breast cancer Mother    Stroke Father    Alcohol abuse Father    Heart disease Maternal Grandfather    Pancreatic cancer Paternal Grandmother    Diabetes  Paternal Grandfather    Colon cancer Neg Hx    Stomach cancer Neg Hx    Rectal cancer Neg Hx    Esophageal cancer Neg Hx     ALLERGIES:  is allergic to bee venom and lactose intolerance (gi).  MEDICATIONS:  Current Outpatient Medications  Medication Sig Dispense Refill   albuterol (VENTOLIN HFA) 108 (90 Base) MCG/ACT inhaler Inhale 2 puffs into the lungs every 6 (six) hours as needed for wheezing or shortness of breath. 18 g 0   DULoxetine (CYMBALTA) 60 MG capsule Take 1 capsule (60 mg total) by mouth in the morning.     EPINEPHrine 0.3 mg/0.3 mL IJ SOAJ injection Inject 0.3 mg into the muscle as needed for anaphylaxis.     folic acid (FOLVITE) 1 MG tablet Take 1 tablet (1 mg total) by mouth in the morning.     glucose blood (TRUE METRIX BLOOD GLUCOSE TEST) test strip Use as instructed. Check blood glucose level by fingerstick 3-4 times per day.  E11.65 200 each 12   insulin glargine (LANTUS) 100 UNIT/ML injection Inject 0.2 mLs (  20 Units total) into the skin 2 (two) times daily. 10 mL 11   insulin lispro (HUMALOG) 100 UNIT/ML KwikPen Inject 0-9 Units into the skin 3 (three) times daily. Sliding scale CBG 70 - 120: 0 units CBG 121 - 150: 1 unit,  CBG 151 - 200: 2 units,  CBG 201 - 250: 3 units,  CBG 251 - 300: 5 units,  CBG 301 - 350: 7 units,  CBG 351 - 400: 9 units   CBG > 400: 9 units and notify your MD 15 mL 11   insulin lispro (HUMALOG) 200 UNIT/ML KwikPen Inject 7 Units into the skin 3 (three) times daily before meals. 15 mL 3   Insulin Pen Needle (TRUEPLUS PEN NEEDLES) 32G X 4 MM MISC Use as instructed. Inject into the skin twice daily 200 each 6   lidocaine-prilocaine (EMLA) cream Apply 1 application topically as directed as needed. 30 g 0   lipase/protease/amylase (CREON) 36000 UNITS CPEP capsule Take 2 capsules (72,000 Units total) by mouth 3 (three) times daily with meals. May also take 1 capsule (36,000 Units total) as needed (with snacks). 56 capsule 0   losartan (COZAAR) 50 MG  tablet Take 1 tablet (50 mg total) by mouth daily.     mirtazapine (REMERON) 30 MG tablet Take 1 tablet (30 mg total) by mouth at bedtime. 90 tablet 1   Multiple Vitamins-Minerals (THEREMS-M) TABS Take 1 tablet by mouth in the morning.     omeprazole (PRILOSEC) 40 MG capsule Take 1 capsule (40 mg total) by mouth in the morning.     ondansetron (ZOFRAN) 8 MG tablet Take 1 tablet (8 mg total) by mouth every 8 (eight) hours as needed for nausea or vomiting. 30 tablet 0   oxyCODONE (OXY IR/ROXICODONE) 5 MG immediate release tablet Take 1-2 tablets (5-10 mg total) by mouth every 6 (six) hours as needed for severe pain. 60 tablet 0   prochlorperazine (COMPAZINE) 10 MG tablet Take 1 tablet (10 mg total) by mouth every 6 (six) hours as needed for nausea or vomiting. 30 tablet 0   TRUEplus Lancets 28G MISC Use as instructed. Check blood glucose level by fingerstick 3-4 times per day.   E11.65 200 each 3   No current facility-administered medications for this visit.    REVIEW OF SYSTEMS:   Constitutional: ( - ) fevers, ( - )  chills , ( - ) night sweats Eyes: ( - ) blurriness of vision, ( - ) double vision, ( - ) watery eyes Ears, nose, mouth, throat, and face: ( - ) mucositis, ( - ) sore throat Respiratory: ( - ) cough, ( - ) dyspnea, ( - ) wheezes Cardiovascular: ( - ) palpitation, ( - ) chest discomfort, ( - ) lower extremity swelling Gastrointestinal:  ( - ) nausea, ( - ) heartburn, ( - ) change in bowel habits Skin: ( - ) abnormal skin rashes Lymphatics: ( - ) new lymphadenopathy, ( - ) easy bruising Neurological: ( - ) numbness, ( - ) tingling, ( - ) new weaknesses Behavioral/Psych: ( - ) mood change, ( - ) new changes  All other systems were reviewed with the patient and are negative.  PHYSICAL EXAMINATION: ECOG PERFORMANCE STATUS: 1 - Symptomatic but completely ambulatory  Vitals:   04/01/21 0948  BP: (!) 146/76  Pulse: 85  Resp: 17  Temp: 98.5 F (36.9 C)  SpO2: 100%   Filed  Weights   04/01/21 0948  Weight: 155 lb 9.6 oz (70.6  kg)    GENERAL: Well-appearing middle-age Caucasian male, alert, no distress and comfortable SKIN: skin color, texture, turgor are normal, no rashes or significant lesions. Jaundice improved from prior EYES: conjunctiva are pink and non-injected, sclera clear LUNGS: clear to auscultation and percussion with normal breathing effort HEART: regular rate & rhythm and no murmurs and no lower extremity edema PSYCH: alert & oriented x 3, fluent speech NEURO: no focal motor/sensory deficits  LABORATORY DATA:  I have reviewed the data as listed CBC Latest Ref Rng & Units 04/01/2021 03/27/2021 03/26/2021  WBC 4.0 - 10.5 K/uL 13.1(H) 10.8(H) 10.2  Hemoglobin 13.0 - 17.0 g/dL 11.8(L) 11.0(L) 11.9(L)  Hematocrit 39.0 - 52.0 % 34.3(L) 32.2(L) 34.5(L)  Platelets 150 - 400 K/uL 347 230 230    CMP Latest Ref Rng & Units 04/01/2021 03/28/2021 03/27/2021  Glucose 70 - 99 mg/dL 309(H) 345(H) 296(H)  BUN 6 - 20 mg/dL 5(L) 11 10  Creatinine 0.61 - 1.24 mg/dL 0.71 0.53(L) 0.33(L)  Sodium 135 - 145 mmol/L 132(L) 131(L) 130(L)  Potassium 3.5 - 5.1 mmol/L 4.1 4.1 3.8  Chloride 98 - 111 mmol/L 98 100 98  CO2 22 - 32 mmol/L '27 26 25  '$ Calcium 8.9 - 10.3 mg/dL 8.8(L) 8.4(L) 8.4(L)  Total Protein 6.5 - 8.1 g/dL 7.4 7.0 6.6  Total Bilirubin 0.3 - 1.2 mg/dL 4.9(HH) 6.9(H) 12.0(H)  Alkaline Phos 38 - 126 U/L 433(H) 684(H) 709(H)  AST 15 - 41 U/L 50(H) 92(H) 143(H)  ALT 0 - 44 U/L 37 69(H) 81(H)    RADIOGRAPHIC STUDIES: I have personally reviewed the radiological images as listed and agreed with the findings in the report: Borderline resectable pancreatic mass with no evidence of metastatic disease. CT CHEST W CONTRAST  Result Date: 03/06/2021 CLINICAL DATA:  Pancreatic adenocarcinoma chest staging EXAM: CT CHEST WITH CONTRAST TECHNIQUE: Multidetector CT imaging of the chest was performed during intravenous contrast administration. CONTRAST:  64m OMNIPAQUE IOHEXOL  300 MG/ML  SOLN COMPARISON:  CT abdomen pelvis, 02/26/2021 FINDINGS: Cardiovascular: Aortic atherosclerosis. Normal heart size. Left and right coronary artery calcifications. No pericardial effusion. Mediastinum/Nodes: No enlarged mediastinal, hilar, or axillary lymph nodes. Thyroid gland, trachea, and esophagus demonstrate no significant findings. Lungs/Pleura: Mild centrilobular and paraseptal emphysema. Background of very fine centrilobular pulmonary nodules, concentrated in the lung apices. Bandlike scarring of the left upper lobe. No pleural effusion or pneumothorax. Upper Abdomen: No acute abnormality. Musculoskeletal: No chest wall mass or suspicious bone lesions identified. IMPRESSION: 1. No evidence of metastatic disease in the chest. 2. Background of very fine centrilobular pulmonary nodules, concentrated in the lung apices, consistent with smoking-related respiratory bronchiolitis. 3. Emphysema. 4. Coronary artery disease. Aortic Atherosclerosis (ICD10-I70.0) and Emphysema (ICD10-J43.9). Electronically Signed   By: AEddie CandleM.D.   On: 03/06/2021 14:31   DG ERCP BILIARY & PANCREATIC DUCTS  Result Date: 03/27/2021 CLINICAL DATA:  60year old male with biliary stricture, undergoing ERCP. EXAM: ERCP TECHNIQUE: Multiple spot images obtained with the fluoroscopic device and submitted for interpretation post-procedure. FLUOROSCOPY TIME:  Fluoroscopy Time:  40 seconds Radiation Exposure Index (if provided by the fluoroscopic device): Not reported Number of Acquired Spot Images: 7 COMPARISON:  CT and pelvis, 02/26/2021. FINDINGS: Multiple AP plain spot radiographs of the epigastrium performed by C-arm during ERCP. Images demonstrated a endoscopy, common bile duct cannulation, retrograde cholangiogram, plastic stent removal and and biliary stent placement. No discrete filling defect is appreciated IMPRESSION: Intra procedure fluoroscopic imaging for ERCP. These images were submitted for radiologic  interpretation only. Please  see the procedural report for the amount of contrast and the fluoroscopy time utilized. Electronically Signed   By: Michaelle Birks MD   On: 03/27/2021 14:57   IR IMAGING GUIDED PORT INSERTION  Result Date: 03/21/2021 INDICATION: 60 year old male with pancreatic cancer. He presents for port catheter placement to establish durable venous access. EXAM: IMPLANTED PORT A CATH PLACEMENT WITH ULTRASOUND AND FLUOROSCOPIC GUIDANCE MEDICATIONS: None ANESTHESIA/SEDATION: 100 mcg fentanyl administered for pain control. This is not moderate sedation. FLUOROSCOPY TIME:  0 minutes, 12 seconds (2 mGy) COMPLICATIONS: None immediate. PROCEDURE: The right neck and chest was prepped with chlorhexidine, and draped in the usual sterile fashion using maximum barrier technique (cap and mask, sterile gown, sterile gloves, large sterile sheet, hand hygiene and cutaneous antiseptic). Local anesthesia was attained by infiltration with 1% lidocaine with epinephrine. Ultrasound demonstrated patency of the right internal jugular vein, and this was documented with an image. Under real-time ultrasound guidance, this vein was accessed with a 21 gauge micropuncture needle and image documentation was performed. A small dermatotomy was made at the access site with an 11 scalpel. A 0.018" wire was advanced into the SVC and the access needle exchanged for a 63F micropuncture vascular sheath. The 0.018" wire was then removed and a 0.035" wire advanced into the IVC. An appropriate location for the subcutaneous reservoir was selected below the clavicle and an incision was made through the skin and underlying soft tissues. The subcutaneous tissues were then dissected using a combination of blunt and sharp surgical technique and a pocket was formed. A single lumen power injectable portacatheter was then tunneled through the subcutaneous tissues from the pocket to the dermatotomy and the port reservoir placed within the  subcutaneous pocket. The venous access site was then serially dilated and a peel away vascular sheath placed over the wire. The wire was removed and the port catheter advanced into position under fluoroscopic guidance. The catheter tip is positioned in the superior cavoatrial junction. This was documented with a spot image. The portacatheter was then tested and found to flush and aspirate well. The port was flushed with saline followed by 100 units/mL heparinized saline. The pocket was then closed in two layers using first subdermal inverted interrupted absorbable sutures followed by a running subcuticular suture. The epidermis was then sealed with Dermabond. The dermatotomy at the venous access site was also closed with Dermabond. IMPRESSION: Successful placement of a right IJ approach Power Port with ultrasound and fluoroscopic guidance. The catheter is ready for use. Electronically Signed   By: Jacqulynn Cadet M.D.   On: 03/21/2021 10:31    ASSESSMENT & PLAN CRHISTOPHER WALTNER 60 y.o. male with medical history significant for borderline resectable pancreatic cancer presents for a follow up visit.   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  The patient does still have an elevation in bilirubin which likely represents a decline from the time of the latest stent placement.  He was evaluated by surgery who determined this is a borderline resectable tumor.  We will plan to start the patient on neoadjuvant chemotherapy.  The 2 options would be Gem/Abraxane or FOLFIRINOX.  Given his excellent functional status I do believe he would be a good candidate for FOLFIRINOX.  We will plan to have the patient start next week on 04/09/2021.   # Adenocarcinoma of the Pancreas, Stage IIB -- At this time disease appears  borderline resectable.  We will proceed with neoadjuvant chemotherapy.  At  this time would prefer a regimen of FOLFIRINOX given his good baseline health. -- case reviewed by surgery, who agrees his disease is borderline resectable.  -- baseline elevations in both CA 19-9 and CEA. Continue to monitor during treatment.  --Staging scans complete.  No need for further imaging at this time. -- Port currently in place. --Return to clinic next week to start treatment.    # Pain Control -- Patient currently taking ibuprofen for his abdominal pain --continue oxycodone 5 to 10 mg every 6 hours as needed   #Supportive Care -- chemotherapy education to be scheduled -- port placed -- zofran '8mg'$  q8H PRN and compazine '10mg'$  PO q6H for nausea --trial of Creon called into pharmacy to help with loose stools. -- EMLA cream for port    No orders of the defined types were placed in this encounter.   All questions were answered. The patient knows to call the clinic with any problems, questions or concerns.  A total of more than 30 minutes were spent on this encounter with face-to-face time and non-face-to-face time, including preparing to see the patient, ordering tests and/or medications, counseling the patient and coordination of care as outlined above.   Ledell Peoples, MD Department of Hematology/Oncology [facility] Phone: [phone] Pager: [phone] Email: [email or web address]  04/01/2021 12:43 PM

## 2021-04-01 NOTE — Progress Notes (Signed)
MD aware of Bilirubin 4.9.  States this is improving.  No action at this time.

## 2021-04-03 ENCOUNTER — Encounter: Payer: Self-pay | Admitting: Hematology and Oncology

## 2021-04-03 ENCOUNTER — Other Ambulatory Visit: Payer: Self-pay

## 2021-04-03 ENCOUNTER — Other Ambulatory Visit: Payer: Self-pay | Admitting: Hematology and Oncology

## 2021-04-03 ENCOUNTER — Other Ambulatory Visit: Payer: Self-pay | Admitting: Family Medicine

## 2021-04-03 ENCOUNTER — Other Ambulatory Visit: Payer: Self-pay | Admitting: Nurse Practitioner

## 2021-04-03 DIAGNOSIS — M2550 Pain in unspecified joint: Secondary | ICD-10-CM

## 2021-04-03 DIAGNOSIS — C250 Malignant neoplasm of head of pancreas: Secondary | ICD-10-CM

## 2021-04-03 DIAGNOSIS — E1142 Type 2 diabetes mellitus with diabetic polyneuropathy: Secondary | ICD-10-CM

## 2021-04-03 NOTE — Telephone Encounter (Signed)
Requested medication (s) are due for refill today:   No  Requested medication (s) are on the active medication list:   Yes  Future visit scheduled:   Yes in 4 days with Zelda   Last ordered: 09/16/2020 #90, 1 refill by Zelda.  Returned because renewed on 03/28/2021 by Dr. Estill Cotta.   Different provider.   Requested Prescriptions  Pending Prescriptions Disp Refills   DULoxetine (CYMBALTA) 60 MG capsule 90 capsule 1    Sig: TAKE 1 CAPSULE (60 MG TOTAL) BY MOUTH DAILY.      Psychiatry: Antidepressants - SNRI Failed - 04/03/2021  9:35 AM      Failed - Last BP in normal range    BP Readings from Last 1 Encounters:  04/01/21 (!) 146/76          Passed - Completed PHQ-2 or PHQ-9 in the last 360 days      Passed - Valid encounter within last 6 months    Recent Outpatient Visits           2 months ago Loose stools   Crum, Vernia Buff, NP   3 months ago Generalized abdominal pain   Lucas, Michelle P, NP   5 months ago Hospital discharge follow-up   [place] [name], NP   6 months ago Essential hypertension   [place], Zelda W, NP   9 months ago Need for influenza vaccination   [place], RPH-CPP       Future Appointments             In 4 days [name], NP [employer]

## 2021-04-04 ENCOUNTER — Encounter: Payer: Self-pay | Admitting: Hematology and Oncology

## 2021-04-04 ENCOUNTER — Other Ambulatory Visit: Payer: Self-pay

## 2021-04-04 ENCOUNTER — Other Ambulatory Visit (HOSPITAL_COMMUNITY): Payer: Self-pay

## 2021-04-04 MED ORDER — OXYCODONE HCL 5 MG PO TABS
5.0000 mg | ORAL_TABLET | Freq: Four times a day (QID) | ORAL | 0 refills | Status: DC | PRN
  Filled 2021-04-04: qty 60, 8d supply, fill #0

## 2021-04-04 MED ORDER — DULOXETINE HCL 60 MG PO CPEP
60.0000 mg | ORAL_CAPSULE | Freq: Every day | ORAL | 0 refills | Status: DC
  Filled 2021-04-04: qty 30, 30d supply, fill #0
  Filled 2021-05-18: qty 30, 30d supply, fill #1

## 2021-04-07 ENCOUNTER — Other Ambulatory Visit: Payer: Self-pay

## 2021-04-07 ENCOUNTER — Ambulatory Visit: Payer: Medicaid Other | Attending: Nurse Practitioner | Admitting: Nurse Practitioner

## 2021-04-07 ENCOUNTER — Encounter: Payer: Self-pay | Admitting: Hematology and Oncology

## 2021-04-07 ENCOUNTER — Other Ambulatory Visit (HOSPITAL_COMMUNITY): Payer: Self-pay

## 2021-04-07 VITALS — BP 131/69 | HR 90 | Ht 71.0 in | Wt 153.8 lb

## 2021-04-07 DIAGNOSIS — Z09 Encounter for follow-up examination after completed treatment for conditions other than malignant neoplasm: Secondary | ICD-10-CM | POA: Diagnosis not present

## 2021-04-07 DIAGNOSIS — E1165 Type 2 diabetes mellitus with hyperglycemia: Secondary | ICD-10-CM

## 2021-04-07 DIAGNOSIS — E1140 Type 2 diabetes mellitus with diabetic neuropathy, unspecified: Secondary | ICD-10-CM | POA: Diagnosis not present

## 2021-04-07 DIAGNOSIS — C250 Malignant neoplasm of head of pancreas: Secondary | ICD-10-CM | POA: Diagnosis not present

## 2021-04-07 DIAGNOSIS — R159 Full incontinence of feces: Secondary | ICD-10-CM | POA: Diagnosis not present

## 2021-04-07 DIAGNOSIS — IMO0002 Reserved for concepts with insufficient information to code with codable children: Secondary | ICD-10-CM

## 2021-04-07 MED ORDER — DIPHENOXYLATE-ATROPINE 2.5-0.025 MG PO TABS
1.0000 | ORAL_TABLET | Freq: Four times a day (QID) | ORAL | 1 refills | Status: DC | PRN
  Filled 2021-04-07: qty 60, 15d supply, fill #0

## 2021-04-07 MED ORDER — PANCRELIPASE (LIP-PROT-AMYL) 36000-114000 UNITS PO CPEP
ORAL_CAPSULE | ORAL | 3 refills | Status: DC
  Filled 2021-04-07: qty 210, 30d supply, fill #0
  Filled 2021-06-06: qty 200, 22d supply, fill #0
  Filled 2021-07-29: qty 200, 22d supply, fill #1
  Filled 2021-09-30: qty 200, 22d supply, fill #0
  Filled 2021-10-06: qty 200, 25d supply, fill #0
  Filled 2021-11-07: qty 200, 25d supply, fill #1

## 2021-04-07 NOTE — Progress Notes (Signed)
Assessment & Plan:  [name] [name] was seen today for hospitalization follow-up.  Diagnoses and all orders for this visit:  Hospital discharge follow-up  Malignant neoplasm of head of pancreas ([place]) -     lipase/protease/amylase (CREON) 36000 UNITS CPEP capsule; Take 2 capsules (72,000 Units total) by mouth 3 (three) times daily with meals. May also take 1 capsule (36,000 Units total) as needed (with snacks). NEEDS PASS.  Incontinence of feces, unspecified fecal incontinence type -     diphenoxylate-atropine (LOMOTIL) 2.5-0.025 MG tablet; Take 1 tablet by mouth 4 (four) times daily as needed for diarrhea or loose stools.  Uncontrolled diabetes mellitus with diabetic neuropathy ([place]) Continue blood sugar control as discussed in office today, low carbohydrate diet, and regular physical exercise as tolerated, 150 minutes per week (30 min each day, 5 days per week, or 50 min 3 days per week). Keep blood sugar logs with fasting goal of 90-130 mg/dl, post prandial (after you eat) less than 180.  For Hypoglycemia: BS <60 and Hyperglycemia BS >400; contact the clinic ASAP. Annual eye exams and foot exams are recommended.   Patient has been counseled on age-appropriate routine health concerns for screening and prevention. These are reviewed and up-to-date. Referrals have been placed accordingly. Immunizations are up-to-date or declined.    Subjective:   Chief Complaint  Patient presents with   Hospitalization Follow-up   HPI [name] [name] 60 y.o. male presents to office today for hospital follow up.  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 Underwent repeat  ERCP on 7-28. Previous biliary stent had migrated into the CBD. It was extracted during the procedure and metal biliary stent was placed. He has chronic diarrhea. Currently taking pancreatic enzyme replacement.  Has CYCLE day 1 of FOLFIRINOX on 04-09-2021  Currently jaundice has significantly improved.   DM 2 Average meter readings: 7 day 271 14 day 274 30 day 249 Diabetes is not well controlled. Also pancreatic CA likely playing a factor. He has an upcoming appt with endocrinology to help manage his DM. Currently adherent with lantus 20 units BID, humalog 7 units TID and SSI. LDl at goal.   Lab Results  Component Value Date   HGBA1C 10.3 (H) 01/28/2021    Lab Results  Component Value Date   LDLCALC 62 06/20/2020     Essential Hypertension Well controlled. Taking losartan 50 mg daily. Denies chest pain, shortness of breath, palpitations, lightheadedness, dizziness, headaches or BLE edema.   BP Readings from Last 3 Encounters:  04/07/21 131/69  04/01/21 (!) 146/76  03/28/21 (!) 153/92    Review of Systems  Constitutional:  Negative for fever, malaise/fatigue and weight loss.  HENT: Negative.  Negative for nosebleeds.   Eyes: Negative.  Negative for blurred vision, double vision and photophobia.  Respiratory: Negative.  Negative for cough and shortness of breath.   Cardiovascular: Negative.  Negative for chest pain, palpitations and leg swelling.  Gastrointestinal:  Positive for diarrhea. Negative for abdominal pain, blood in stool, constipation, heartburn, melena, nausea and vomiting.  Musculoskeletal: Negative.  Negative for myalgias.  Neurological: Negative.  Negative for dizziness, focal weakness, seizures and headaches.  Psychiatric/Behavioral: Negative.  Negative for suicidal ideas.    Past Medical History:  Diagnosis Date   Barrett's esophagus dx 2016   Bronchitis    Chronic hepatitis C without hepatic coma ([place]) 10/31/2014   Depression    Diabetes ([place])    ED (erectile  dysfunction)    GERD (gastroesophageal reflux disease)    Hepatitis C    Hiatal hernia 10/2014   3cm   Neuropathy    S/P transmetatarsal amputation of foot, left ([place]) 11/28/2020    Past Surgical History:  Procedure Laterality Date   AMPUTATION Left 11/15/2020   Procedure: LEFT TRANSMETATARSALS AMPUTATION;  Surgeon: Newt Minion, MD;  Location: [facility];  Service: Orthopedics;  Laterality: Left;   APPENDECTOMY     BILIARY STENT PLACEMENT N/A 02/27/2021   Procedure: BILIARY STENT PLACEMENT;  Surgeon: Milus Banister, MD;  Location: [facility];  Service: Endoscopy;  Laterality: N/A;   BILIARY STENT PLACEMENT N/A 03/27/2021   Procedure: BILIARY STENT PLACEMENT;  Surgeon: Jackquline Denmark, MD;  Location: [facility];  Service: Endoscopy;  Laterality: N/A;   BIOPSY  03/27/2021   Procedure: BIOPSY;  Surgeon: Jackquline Denmark, MD;  Location: [facility];  Service: Endoscopy;;   COLONOSCOPY N/A 02/16/2014   Procedure: COLONOSCOPY;  Surgeon: Gatha Mayer, MD;  Location: [facility];  Service: Endoscopy;  Laterality: N/A;   COLONOSCOPY     ENDOSCOPIC RETROGRADE CHOLANGIOPANCREATOGRAPHY (ERCP) WITH PROPOFOL N/A 02/27/2021   Procedure: ENDOSCOPIC RETROGRADE CHOLANGIOPANCREATOGRAPHY (ERCP) WITH PROPOFOL;  Surgeon: Milus Banister, MD;  Location: [facility];  Service: Endoscopy;  Laterality: N/A;   ERCP N/A 03/27/2021   Procedure: ENDOSCOPIC RETROGRADE CHOLANGIOPANCREATOGRAPHY (ERCP);  Surgeon: Jackquline Denmark, MD;  Location: [facility];  Service: Endoscopy;  Laterality: N/A;   EUS N/A 02/27/2021   Procedure: UPPER ENDOSCOPIC ULTRASOUND (EUS) RADIAL;  Surgeon: Milus Banister, MD;  Location: [facility];  Service: Endoscopy;  Laterality: N/A;   FINE NEEDLE ASPIRATION N/A 02/27/2021   Procedure: FINE NEEDLE ASPIRATION (FNA) LINEAR;  Surgeon: Milus Banister, MD;  Location: [facility];  Service: Endoscopy;  Laterality: N/A;   IR IMAGING GUIDED PORT INSERTION  03/21/2021   SPHINCTEROTOMY  02/27/2021    Procedure: SPHINCTEROTOMY;  Surgeon: Milus Banister, MD;  Location: [facility];  Service: Endoscopy;;   STENT REMOVAL  03/27/2021   Procedure: STENT REMOVAL;  Surgeon: Jackquline Denmark, MD;  Location: [facility];  Service: Endoscopy;;   TONSILLECTOMY      Family History  Problem Relation Age of Onset   Breast cancer Mother    Stroke Father    Alcohol abuse Father    Heart disease Maternal Grandfather    Pancreatic cancer Paternal Grandmother    Diabetes Paternal Grandfather    Colon cancer Neg Hx    Stomach cancer Neg Hx    Rectal cancer Neg Hx    Esophageal cancer Neg Hx     Social History Reviewed with no changes to be made today.   Outpatient Medications Prior to Visit  Medication Sig Dispense Refill   albuterol (VENTOLIN HFA) 108 (90 Base) MCG/ACT inhaler Inhale 2 puffs into the lungs every 6 (six) hours as needed for wheezing or shortness of breath. 18 g 0   DULoxetine (CYMBALTA) 60 MG capsule Take 1 capsule (60 mg total) by mouth daily. 90 capsule 0   EPINEPHrine 0.3 mg/0.3 mL IJ SOAJ injection Inject 0.3 mg into the muscle as needed for anaphylaxis.     folic acid (FOLVITE) 1 MG tablet Take 1 tablet (1 mg total) by mouth in the morning.     glucose  blood (TRUE METRIX BLOOD GLUCOSE TEST) test strip Use as instructed. Check blood glucose level by fingerstick 3-4 times per day.  E11.65 200 each 12   insulin glargine (LANTUS) 100 UNIT/ML injection Inject 0.2 mLs (20 Units total) into the skin 2 (two) times daily. 10 mL 11   insulin lispro (HUMALOG) 100 UNIT/ML KwikPen Inject 0-9 Units into the skin 3 (three) times daily. Sliding scale CBG 70 - 120: 0 units CBG 121 - 150: 1 unit,  CBG 151 - 200: 2 units,  CBG 201 - 250: 3 units,  CBG 251 - 300: 5 units,  CBG 301 - 350: 7 units,  CBG 351 - 400: 9 units   CBG > 400: 9 units and notify your MD 15 mL 11   insulin lispro (HUMALOG) 200 UNIT/ML KwikPen Inject 7 Units into the skin 3 (three) times daily before meals. 15 mL 3   Insulin Pen  Needle (TRUEPLUS PEN NEEDLES) 32G X 4 MM MISC Use as instructed. Inject into the skin twice daily 200 each 6   lidocaine-prilocaine (EMLA) cream Apply 1 application topically as directed as needed. 30 g 0   losartan (COZAAR) 50 MG tablet Take 1 tablet (50 mg total) by mouth daily.     mirtazapine (REMERON) 30 MG tablet Take 1 tablet (30 mg total) by mouth at bedtime. 90 tablet 1   Multiple Vitamins-Minerals (THEREMS-M) TABS Take 1 tablet by mouth in the morning.     omeprazole (PRILOSEC) 40 MG capsule Take 1 capsule (40 mg total) by mouth in the morning.     ondansetron (ZOFRAN) 8 MG tablet Take 1 tablet (8 mg total) by mouth every 8 (eight) hours as needed for nausea or vomiting. 30 tablet 0   oxyCODONE (OXY IR/ROXICODONE) 5 MG immediate release tablet Take 1-2 tablets (5-10 mg total) by mouth every 6 (six) hours as needed for severe pain. 60 tablet 0   prochlorperazine (COMPAZINE) 10 MG tablet Take 1 tablet (10 mg total) by mouth every 6 (six) hours as needed for nausea or vomiting. 30 tablet 0   TRUEplus Lancets 28G MISC Use as instructed. Check blood glucose level by fingerstick 3-4 times per day.   E11.65 200 each 3   lipase/protease/amylase (CREON) 36000 UNITS CPEP capsule Take 2 capsules (72,000 Units total) by mouth 3 (three) times daily with meals. May also take 1 capsule (36,000 Units total) as needed (with snacks). 56 capsule 0   No facility-administered medications prior to visit.    Allergies  Allergen Reactions   Bee Venom Anaphylaxis   Lactose Intolerance (Gi) Other (See Comments)    GI upset       Objective:    BP 131/69   Pulse 90   Ht '5\' 11"'$  (1.803 m)   Wt 153 lb 12.8 oz (69.8 kg)   SpO2 100%   BMI 21.45 kg/m  Wt Readings from Last 3 Encounters:  04/07/21 153 lb 12.8 oz (69.8 kg)  04/01/21 155 lb 9.6 oz (70.6 kg)  03/26/21 156 lb 11.2 oz (71.1 kg)    Physical Exam Vitals and nursing note reviewed.  Constitutional:      Appearance: He is well-developed.   HENT:     Head: Normocephalic and atraumatic.  Cardiovascular:     Rate and Rhythm: Normal rate and regular rhythm.     Heart sounds: Normal heart sounds. No murmur heard.   No friction rub. No gallop.  Pulmonary:     Effort: Pulmonary effort is normal. No  tachypnea or respiratory distress.     Breath sounds: Normal breath sounds. No decreased breath sounds, wheezing, rhonchi or rales.  Chest:     Chest wall: No tenderness.  Abdominal:     General: Bowel sounds are normal.     Palpations: Abdomen is soft.  Musculoskeletal:        General: Normal range of motion.     Cervical back: Normal range of motion.     Left Lower Extremity: Left leg is amputated below ankle.  Feet:     Right foot:     Skin integrity: Skin integrity normal.     Left foot:     Skin integrity: Skin integrity normal.  Skin:    General: Skin is warm and dry.  Neurological:     Mental Status: He is alert and oriented to person, place, and time.     Coordination: Coordination normal.  Psychiatric:        Behavior: Behavior normal. Behavior is cooperative.        Thought Content: Thought content normal.        Judgment: Judgment normal.         Patient has been counseled extensively about nutrition and exercise as well as the importance of adherence with medications and regular follow-up. The patient was given clear instructions to go to ER or return to medical center if symptoms don't improve, worsen or new problems develop. The patient verbalized understanding.   Follow-up: Return in about 4 weeks (around 05/05/2021) for Labs in 3 weeks. See me in November.   [name], FNP-BC [facility] and [place], [place]   04/08/2021, 8:44 PM

## 2021-04-08 ENCOUNTER — Encounter: Payer: Self-pay | Admitting: Nurse Practitioner

## 2021-04-09 ENCOUNTER — Telehealth: Payer: Self-pay

## 2021-04-09 ENCOUNTER — Telehealth: Payer: Self-pay | Admitting: Nurse Practitioner

## 2021-04-09 ENCOUNTER — Encounter: Payer: Self-pay | Admitting: Hematology and Oncology

## 2021-04-09 ENCOUNTER — Other Ambulatory Visit: Payer: Self-pay | Admitting: Hematology and Oncology

## 2021-04-09 ENCOUNTER — Other Ambulatory Visit: Payer: Self-pay | Admitting: *Deleted

## 2021-04-09 ENCOUNTER — Inpatient Hospital Stay: Payer: Medicaid Other

## 2021-04-09 ENCOUNTER — Inpatient Hospital Stay (HOSPITAL_BASED_OUTPATIENT_CLINIC_OR_DEPARTMENT_OTHER): Payer: Medicaid Other | Admitting: Hematology and Oncology

## 2021-04-09 ENCOUNTER — Ambulatory Visit: Payer: Self-pay | Admitting: *Deleted

## 2021-04-09 ENCOUNTER — Other Ambulatory Visit: Payer: Self-pay

## 2021-04-09 VITALS — BP 115/61 | HR 76 | Temp 97.4°F | Resp 18 | Wt 152.1 lb

## 2021-04-09 DIAGNOSIS — Z95828 Presence of other vascular implants and grafts: Secondary | ICD-10-CM

## 2021-04-09 DIAGNOSIS — C250 Malignant neoplasm of head of pancreas: Secondary | ICD-10-CM

## 2021-04-09 DIAGNOSIS — Z5111 Encounter for antineoplastic chemotherapy: Secondary | ICD-10-CM | POA: Diagnosis not present

## 2021-04-09 DIAGNOSIS — K8689 Other specified diseases of pancreas: Secondary | ICD-10-CM | POA: Diagnosis not present

## 2021-04-09 LAB — CBC WITH DIFFERENTIAL (CANCER CENTER ONLY)
Abs Immature Granulocytes: 0.05 10*3/uL (ref 0.00–0.07)
Basophils Absolute: 0.1 10*3/uL (ref 0.0–0.1)
Basophils Relative: 1 %
Eosinophils Absolute: 0.1 10*3/uL (ref 0.0–0.5)
Eosinophils Relative: 1 %
HCT: 36.8 % — ABNORMAL LOW (ref 39.0–52.0)
Hemoglobin: 12.6 g/dL — ABNORMAL LOW (ref 13.0–17.0)
Immature Granulocytes: 0 %
Lymphocytes Relative: 17 %
Lymphs Abs: 2.2 10*3/uL (ref 0.7–4.0)
MCH: 31.7 pg (ref 26.0–34.0)
MCHC: 34.2 g/dL (ref 30.0–36.0)
MCV: 92.5 fL (ref 80.0–100.0)
Monocytes Absolute: 1.5 10*3/uL — ABNORMAL HIGH (ref 0.1–1.0)
Monocytes Relative: 12 %
Neutro Abs: 8.5 10*3/uL — ABNORMAL HIGH (ref 1.7–7.7)
Neutrophils Relative %: 69 %
Platelet Count: 322 10*3/uL (ref 150–400)
RBC: 3.98 MIL/uL — ABNORMAL LOW (ref 4.22–5.81)
RDW: 13.3 % (ref 11.5–15.5)
WBC Count: 12.3 10*3/uL — ABNORMAL HIGH (ref 4.0–10.5)
nRBC: 0 % (ref 0.0–0.2)

## 2021-04-09 LAB — CMP (CANCER CENTER ONLY)
ALT: 15 U/L (ref 0–44)
AST: 27 U/L (ref 15–41)
Albumin: 2.9 g/dL — ABNORMAL LOW (ref 3.5–5.0)
Alkaline Phosphatase: 288 U/L — ABNORMAL HIGH (ref 38–126)
Anion gap: 8 (ref 5–15)
BUN: 8 mg/dL (ref 6–20)
CO2: 25 mmol/L (ref 22–32)
Calcium: 9 mg/dL (ref 8.9–10.3)
Chloride: 96 mmol/L — ABNORMAL LOW (ref 98–111)
Creatinine: 0.93 mg/dL (ref 0.61–1.24)
GFR, Estimated: >60 mL/min (ref >60)
Glucose, Bld: 666 mg/dL (ref 70–99)
Potassium: 5.5 mmol/L — ABNORMAL HIGH (ref 3.5–5.1)
Sodium: 129 mmol/L — ABNORMAL LOW (ref 135–145)
Total Bilirubin: 3.4 mg/dL — ABNORMAL HIGH (ref 0.3–1.2)
Total Protein: 8 g/dL (ref 6.5–8.1)

## 2021-04-09 LAB — LACTATE DEHYDROGENASE: LDH: 211 U/L — ABNORMAL HIGH (ref 98–192)

## 2021-04-09 MED ORDER — SODIUM CHLORIDE 0.9% FLUSH
10.0000 mL | INTRAVENOUS | Status: AC | PRN
  Administered 2021-04-09: 10 mL
  Filled 2021-04-09: qty 10

## 2021-04-09 MED ORDER — HEPARIN SOD (PORK) LOCK FLUSH 100 UNIT/ML IV SOLN
500.0000 [IU] | INTRAVENOUS | Status: DC | PRN
  Filled 2021-04-09: qty 5

## 2021-04-09 NOTE — Telephone Encounter (Signed)
ERROR

## 2021-04-09 NOTE — Progress Notes (Signed)
[place] [phone]   [phone]  PROGRESS NOTE  Patient Care Team: [name], NP as PCP - General (Nurse Practitioner) Havery Moros, Carlota Raspberry, MD as Consulting Physician (Gastroenterology) Orson Slick, MD as Consulting Physician (Oncology) Royston Bake, RN as Registered Nurse  Hematological/Oncological History # Adenocarcinoma of the Pancreas, Stage IIB 02/26/2021: patient seen by GI for jaundice and pancreatic mass causing CBD obstruction. CT abdomen showed pancreatic head mass (4.4 x 3.7 cm) and enlarged portocaval lymph nodes. 02/27/2021: ERCP with placement of a nonmetallic biliary stent. FNA of pancreatic head showed malignant cells consistent with pancreatic adenocarcinoma. 03/05/2021: CT Chest showed no evidence of metastatic disease in the chest. 03/12/2021: establish care with Dr. Lorenso Courier 04/09/2021: intended Cycle 1 Day 1 of FOLFIRINOX  Interval History:  [name] [name] 60 y.o. male with medical history significant for borderline resectable pancreatic cancer presents for a follow up visit. The patient's last visit was on 03/12/2021. In the interim since the last visit he was admitted to the hospital with blockage of his biliary stent.  He underwent repeat ERCP on 03/27/2021.  On exam today [name] [name] is accompanied by his mom. He reports he has been well since his discharge from the hospital.  His jaundice has decreased and overall he feels well.  He is continuing to have some diarrhea and difficulty obtaining his Creon medication.  Fortunately he is not having any nausea or vomiting.  He does continue to have discomfort predominantly in his lower stomach around his hips.  He does endorse today having a history of 1 abnormal gene for hemochromatosis.  He currently denies any fevers, chills, sweats, shortness of breath, or chest pain.  He notes he is willing and able to start chemotherapy treatment as soon as is feasible.  The bulk of  our discussion focused on the chemotherapy treatment and assuring he had everything he needed to start treatment next week.  MEDICAL HISTORY:  Past Medical History:  Diagnosis Date   Barrett's esophagus dx 2016   Bronchitis    Chronic hepatitis C without hepatic coma ([place]) 10/31/2014   Depression    Diabetes ([place])    ED (erectile dysfunction)    GERD (gastroesophageal reflux disease)    Hepatitis C    Hiatal hernia 10/2014   3cm   Neuropathy    S/P transmetatarsal amputation of foot, left ([place]) 11/28/2020    SURGICAL HISTORY: Past Surgical History:  Procedure Laterality Date   AMPUTATION Left 11/15/2020   Procedure: LEFT TRANSMETATARSALS AMPUTATION;  Surgeon: Newt Minion, MD;  Location: [facility];  Service: Orthopedics;  Laterality: Left;   APPENDECTOMY     BILIARY STENT PLACEMENT N/A 02/27/2021   Procedure: BILIARY STENT PLACEMENT;  Surgeon: Milus Banister, MD;  [facility]: [facility];  Service: Endoscopy;  Laterality: N/A;   BILIARY STENT PLACEMENT N/A 03/27/2021   Procedure: BILIARY STENT PLACEMENT;  Surgeon: Jackquline Denmark, MD;  Location: [facility];  Service: Endoscopy;  Laterality: N/A;   BIOPSY  03/27/2021   Procedure: BIOPSY;  Surgeon: Jackquline Denmark, MD;  [facility];  Service: Endoscopy;;   COLONOSCOPY N/A 02/16/2014   Procedure: COLONOSCOPY;  Surgeon: Gatha Mayer, MD;  [facility];  Service: Endoscopy;  Laterality: N/A;   COLONOSCOPY     ENDOSCOPIC RETROGRADE CHOLANGIOPANCREATOGRAPHY (ERCP) WITH PROPOFOL N/A 02/27/2021   Procedure: ENDOSCOPIC RETROGRADE CHOLANGIOPANCREATOGRAPHY (ERCP) WITH PROPOFOL;  Surgeon: Milus Banister, MD;  [facility];  Service: Endoscopy;  Laterality: N/A;  ERCP N/A 03/27/2021   Procedure: ENDOSCOPIC RETROGRADE CHOLANGIOPANCREATOGRAPHY (ERCP);  Surgeon: Jackquline Denmark, MD;  Location: [facility];  Service: Endoscopy;  Laterality: N/A;   EUS N/A 02/27/2021   Procedure: UPPER ENDOSCOPIC ULTRASOUND (EUS) RADIAL;   Surgeon: Milus Banister, MD;  [facility];  Service: Endoscopy;  Laterality: N/A;   FINE NEEDLE ASPIRATION N/A 02/27/2021   Procedure: FINE NEEDLE ASPIRATION (FNA) LINEAR;  Surgeon: Milus Banister, MD;  [facility];  Service: Endoscopy;  Laterality: N/A;   IR IMAGING GUIDED PORT INSERTION  03/21/2021   SPHINCTEROTOMY  02/27/2021   Procedure: SPHINCTEROTOMY;  Surgeon: Milus Banister, MD;  [facility];  Service: Endoscopy;;   STENT REMOVAL  03/27/2021   Procedure: Lavell Islam REMOVAL;  Surgeon: Jackquline Denmark, MD;  [facility];  Service: Endoscopy;;   TONSILLECTOMY      SOCIAL HISTORY: Social History   Socioeconomic History   Marital status: Single    Spouse name: Not on file   Number of children: Not on file   Years of education: Not on file   Highest education level: Not on file  Occupational History   Not on file  Tobacco Use   Smoking status: Every Day    Packs/day: 0.50    Years: 35.00    Pack years: 17.50    Types: Cigarettes   Smokeless tobacco: Never  Vaping Use   Vaping Use: Never used  Substance and Sexual Activity   Alcohol use: Not Currently    Comment: previous   Drug use: No   Sexual activity: Not on file  Other Topics Concern   Not on file  Social History Narrative   Not on file   Social Determinants of Health   Financial Resource Strain: High Risk   Difficulty of Paying Living Expenses: Very hard  Food Insecurity: Not on file  Transportation Needs: Unmet Transportation Needs   Lack of Transportation (Medical): Yes   Lack of Transportation (Non-Medical): Yes  Physical Activity: Not on file  Stress: Not on file  Social Connections: Not on file  Intimate Partner Violence: Not on file    FAMILY HISTORY: Family History  Problem Relation Age of Onset   Breast cancer Mother    Stroke Father    Alcohol abuse Father    Heart disease Maternal Grandfather    Pancreatic cancer Paternal Grandmother    Diabetes  Paternal Grandfather    Colon cancer Neg Hx    Stomach cancer Neg Hx    Rectal cancer Neg Hx    Esophageal cancer Neg Hx     ALLERGIES:  is allergic to bee venom and lactose intolerance (gi).  MEDICATIONS:  Current Outpatient Medications  Medication Sig Dispense Refill   albuterol (VENTOLIN HFA) 108 (90 Base) MCG/ACT inhaler Inhale 2 puffs into the lungs every 6 (six) hours as needed for wheezing or shortness of breath. 18 g 0   diphenoxylate-atropine (LOMOTIL) 2.5-0.025 MG tablet Take 1 tablet by mouth 4 (four) times daily as needed for diarrhea or loose stools. 60 tablet 1   DULoxetine (CYMBALTA) 60 MG capsule Take 1 capsule (60 mg total) by mouth daily. 90 capsule 0   EPINEPHrine 0.3 mg/0.3 mL IJ SOAJ injection Inject 0.3 mg into the muscle as needed for anaphylaxis.     folic acid (FOLVITE) 1 MG tablet Take 1 tablet (1 mg total) by mouth in the morning.     glucose blood (TRUE METRIX BLOOD GLUCOSE TEST) test strip Use as instructed. Check  blood glucose level by fingerstick 3-4 times per day.  E11.65 200 each 12   insulin glargine (LANTUS) 100 UNIT/ML injection Inject 0.2 mLs (20 Units total) into the skin 2 (two) times daily. 10 mL 11   insulin lispro (HUMALOG) 100 UNIT/ML KwikPen Inject 0-9 Units into the skin 3 (three) times daily. Sliding scale CBG 70 - 120: 0 units CBG 121 - 150: 1 unit,  CBG 151 - 200: 2 units,  CBG 201 - 250: 3 units,  CBG 251 - 300: 5 units,  CBG 301 - 350: 7 units,  CBG 351 - 400: 9 units   CBG > 400: 9 units and notify your MD 15 mL 11   insulin lispro (HUMALOG) 200 UNIT/ML KwikPen Inject 7 Units into the skin 3 (three) times daily before meals. 15 mL 3   Insulin Pen Needle (TRUEPLUS PEN NEEDLES) 32G X 4 MM MISC Use as instructed. Inject into the skin twice daily 200 each 6   lidocaine-prilocaine (EMLA) cream Apply 1 application topically as directed as needed. 30 g 0   lipase/protease/amylase (CREON) 36000 UNITS CPEP capsule Take 2 capsules (72,000 Units total)  by mouth 3 (three) times daily with meals. May also take 1 capsule (36,000 Units total) as needed (with snacks). NEEDS PASS. 210 capsule 3   losartan (COZAAR) 50 MG tablet Take 1 tablet (50 mg total) by mouth daily.     mirtazapine (REMERON) 30 MG tablet Take 1 tablet (30 mg total) by mouth at bedtime. 90 tablet 1   Multiple Vitamins-Minerals (THEREMS-M) TABS Take 1 tablet by mouth in the morning.     omeprazole (PRILOSEC) 40 MG capsule Take 1 capsule (40 mg total) by mouth in the morning.     ondansetron (ZOFRAN) 8 MG tablet Take 1 tablet (8 mg total) by mouth every 8 (eight) hours as needed for nausea or vomiting. 30 tablet 0   oxyCODONE (OXY IR/ROXICODONE) 5 MG immediate release tablet Take 1-2 tablets (5-10 mg total) by mouth every 6 (six) hours as needed for severe pain. 60 tablet 0   prochlorperazine (COMPAZINE) 10 MG tablet Take 1 tablet (10 mg total) by mouth every 6 (six) hours as needed for nausea or vomiting. 30 tablet 0   TRUEplus Lancets 28G MISC Use as instructed. Check blood glucose level by fingerstick 3-4 times per day.   E11.65 200 each 3   No current facility-administered medications for this visit.    REVIEW OF SYSTEMS:   Constitutional: ( - ) fevers, ( - )  chills , ( - ) night sweats Eyes: ( - ) blurriness of vision, ( - ) double vision, ( - ) watery eyes Ears, nose, mouth, throat, and face: ( - ) mucositis, ( - ) sore throat Respiratory: ( - ) cough, ( - ) dyspnea, ( - ) wheezes Cardiovascular: ( - ) palpitation, ( - ) chest discomfort, ( - ) lower extremity swelling Gastrointestinal:  ( - ) nausea, ( - ) heartburn, ( - ) change in bowel habits Skin: ( - ) abnormal skin rashes Lymphatics: ( - ) new lymphadenopathy, ( - ) easy bruising Neurological: ( - ) numbness, ( - ) tingling, ( - ) new weaknesses Behavioral/Psych: ( - ) mood change, ( - ) new changes  All other systems were reviewed with the patient and are negative.  PHYSICAL EXAMINATION: ECOG PERFORMANCE  STATUS: 1 - Symptomatic but completely ambulatory  Vitals:   04/09/21 1500  BP: 115/61  Pulse: 76  Resp: 18  Temp: (!) 97.4 F (36.3 C)  SpO2: 100%   Filed Weights   04/09/21 1500  Weight: 152 lb 1.6 oz (69 kg)    GENERAL: Well-appearing middle-age Caucasian male, alert, no distress and comfortable SKIN: skin color, texture, turgor are normal, no rashes or significant lesions. Jaundice improved from prior EYES: conjunctiva are pink and non-injected, sclera clear LUNGS: clear to auscultation and percussion with normal breathing effort HEART: regular rate & rhythm and no murmurs and no lower extremity edema PSYCH: alert & oriented x 3, fluent speech NEURO: no focal motor/sensory deficits  LABORATORY DATA:  I have reviewed the data as listed CBC Latest Ref Rng & Units 04/09/2021 04/01/2021 03/27/2021  WBC 4.0 - 10.5 K/uL 12.3(H) 13.1(H) 10.8(H)  Hemoglobin 13.0 - 17.0 g/dL 12.6(L) 11.8(L) 11.0(L)  Hematocrit 39.0 - 52.0 % 36.8(L) 34.3(L) 32.2(L)  Platelets 150 - 400 K/uL 322 347 230    CMP Latest Ref Rng & Units 04/09/2021 04/01/2021 03/28/2021  Glucose 70 - 99 mg/dL 666(HH) 309(H) 345(H)  BUN 6 - 20 mg/dL 8 5(L) 11  Creatinine 0.61 - 1.24 mg/dL 0.93 0.71 0.53(L)  Sodium 135 - 145 mmol/L 129(L) 132(L) 131(L)  Potassium 3.5 - 5.1 mmol/L 5.5(H) 4.1 4.1  Chloride 98 - 111 mmol/L 96(L) 98 100  CO2 22 - 32 mmol/L '25 27 26  '$ Calcium 8.9 - 10.3 mg/dL 9.0 8.8(L) 8.4(L)  Total Protein 6.5 - 8.1 g/dL 8.0 7.4 7.0  Total Bilirubin 0.3 - 1.2 mg/dL 3.4(H) 4.9(HH) 6.9(H)  Alkaline Phos 38 - 126 U/L 288(H) 433(H) 684(H)  AST 15 - 41 U/L 27 50(H) 92(H)  ALT 0 - 44 U/L 15 37 69(H)    RADIOGRAPHIC STUDIES: I have personally reviewed the radiological images as listed and agreed with the findings in the report: Borderline resectable pancreatic mass with no evidence of metastatic disease. DG ERCP BILIARY & PANCREATIC DUCTS  Result Date: 03/27/2021 CLINICAL DATA:  60 year old male with biliary  stricture, undergoing ERCP. EXAM: ERCP TECHNIQUE: Multiple spot images obtained with the fluoroscopic device and submitted for interpretation post-procedure. FLUOROSCOPY TIME:  Fluoroscopy Time:  40 seconds Radiation Exposure Index (if provided by the fluoroscopic device): Not reported Number of Acquired Spot Images: 7 COMPARISON:  CT and pelvis, 02/26/2021. FINDINGS: Multiple AP plain spot radiographs of the epigastrium performed by C-arm during ERCP. Images demonstrated a endoscopy, common bile duct cannulation, retrograde cholangiogram, plastic stent removal and and biliary stent placement. No discrete filling defect is appreciated IMPRESSION: Intra procedure fluoroscopic imaging for ERCP. These images were submitted for radiologic interpretation only. Please see the procedural report for the amount of contrast and the fluoroscopy time utilized. Electronically Signed   By: Michaelle Birks MD   On: 03/27/2021 14:57   IR IMAGING GUIDED PORT INSERTION  Result Date: 03/21/2021 INDICATION: 60 year old male with pancreatic cancer. He presents for port catheter placement to establish durable venous access. EXAM: IMPLANTED PORT A CATH PLACEMENT WITH ULTRASOUND AND FLUOROSCOPIC GUIDANCE MEDICATIONS: None ANESTHESIA/SEDATION: 100 mcg fentanyl administered for pain control. This is not moderate sedation. FLUOROSCOPY TIME:  0 minutes, 12 seconds (2 mGy) COMPLICATIONS: None immediate. PROCEDURE: The right neck and chest was prepped with chlorhexidine, and draped in the usual sterile fashion using maximum barrier technique (cap and mask, sterile gown, sterile gloves, large sterile sheet, hand hygiene and cutaneous antiseptic). Local anesthesia was attained by infiltration with 1% lidocaine with epinephrine. Ultrasound demonstrated patency of the right internal jugular vein, and this was documented with an image.  Under real-time ultrasound guidance, this vein was accessed with a 21 gauge micropuncture needle and image  documentation was performed. A small dermatotomy was made at the access site with an 11 scalpel. A 0.018" wire was advanced into the SVC and the access needle exchanged for a 43F micropuncture vascular sheath. The 0.018" wire was then removed and a 0.035" wire advanced into the IVC. An appropriate location for the subcutaneous reservoir was selected below the clavicle and an incision was made through the skin and underlying soft tissues. The subcutaneous tissues were then dissected using a combination of blunt and sharp surgical technique and a pocket was formed. A single lumen power injectable portacatheter was then tunneled through the subcutaneous tissues from the pocket to the dermatotomy and the port reservoir placed within the subcutaneous pocket. The venous access site was then serially dilated and a peel away vascular sheath placed over the wire. The wire was removed and the port catheter advanced into position under fluoroscopic guidance. The catheter tip is positioned in the superior cavoatrial junction. This was documented with a spot image. The portacatheter was then tested and found to flush and aspirate well. The port was flushed with saline followed by 100 units/mL heparinized saline. The pocket was then closed in two layers using first subdermal inverted interrupted absorbable sutures followed by a running subcuticular suture. The epidermis was then sealed with Dermabond. The dermatotomy at the venous access site was also closed with Dermabond. IMPRESSION: Successful placement of a right IJ approach Power Port with ultrasound and fluoroscopic guidance. The catheter is ready for use. Electronically Signed   By: Jacqulynn Cadet M.D.   On: 03/21/2021 10:31    ASSESSMENT & PLAN [name] [name] 60 y.o. male with medical history significant for borderline resectable pancreatic cancer presents for a follow up visit.   After review of the labs, review of the records, and discussion with the patient the  patients findings are most consistent with locoregional adenocarcinoma of the pancreas.  The patient does still have an elevation in bilirubin which likely represents a decline from the time of the latest stent placement.  He was evaluated by surgery who determined this is a borderline resectable tumor.  We will plan to start the patient on neoadjuvant chemotherapy.  The 2 options would be Gem/Abraxane or FOLFIRINOX.  Given his excellent functional status I do believe he would be a good candidate for FOLFIRINOX.  We will plan to have the patient start tomorrow on 04/10/2021.   # Adenocarcinoma of the Pancreas, Stage IIB -- At this time disease appears borderline resectable.  We will proceed with neoadjuvant chemotherapy.  At this time would prefer a regimen of FOLFIRINOX given his good baseline health. -- case reviewed by surgery, who agrees his disease is borderline resectable.  -- baseline elevations in both CA 19-9 and CEA. Continue to monitor during treatment.  --Staging scans complete.  No need for further imaging at this time. Will plan for repeat imaging in approximately 8 cycles to asses for  -- Cycle 1 Day 1 of FOLFIRINOX scheduled for 04/10/2021 -- RTC in 2 weeks for Cycle 2 of treatment   # Pain Control -- Patient currently taking ibuprofen for his abdominal pain --continue oxycodone 5 to 10 mg every 6 hours as needed   #Supportive Care -- chemotherapy education to be scheduled -- port placed -- zofran '8mg'$  q8H PRN and compazine '10mg'$  PO q6H for nausea --trial of Creon called into pharmacy to help with loose  stools. -- EMLA cream for port    No orders of the defined types were placed in this encounter.   All questions were answered. The patient knows to call the clinic with any problems, questions or concerns.  A total of more than 30 minutes were spent on this encounter with face-to-face time and non-face-to-face time, including preparing to see the patient, ordering tests and/or  medications, counseling the patient and coordination of care as outlined above.   Ledell Peoples, MD Department of Hematology/Oncology [place] at [facility] Phone: [phone] Pager: [phone] Email: [email or web address]  04/09/2021 6:20 PM

## 2021-04-09 NOTE — Progress Notes (Signed)
Pt requested to stay accessed overnight due to having infusion in the am at [place]. Biopatch and dressing applied.

## 2021-04-09 NOTE — Telephone Encounter (Signed)
CRITICAL VALUE STICKER  CRITICAL VALUE: Glucose = 666  RECEIVER (on-site recipient of call): Yetta Glassman, CMA  DATE & TIME NOTIFIED: 04/09/21 at 3:30p  MESSENGER (representative from lab): Hillary  MD NOTIFIED: Dr. Lorenso Courier  TIME OF NOTIFICATION: 04/09/21 at 3:35p  RESPONSE: Notification given to Joesphine Bare, RN for follow-up with provider.

## 2021-04-09 NOTE — Telephone Encounter (Signed)
BS

## 2021-04-09 NOTE — Telephone Encounter (Signed)
Notified by Drucie Ip, RN from [facility] , Dr. Lorenso Courier that patient has abnormal lab results. Labs 1445 results glucose- 666, sodium- 129, potassium -5.5 bilirubin-3.4. Beth RN reports patient is scheduled for first chemo treatment tomorrow. Patient will not be treated with abnormal labs. RN reports patient alert and oriented denies symptoms of hyperglycemia. C/o muscle cramps and when instructed to go to ED patient pleasantly refused. RN reports patient reported he did not take prescribed insulin today. RN calling to notify PCP of labs. NT called patient to triage symptoms . Patient reports he has not been home since leaving cancer center. Patient denies any symptoms of hyperglycemia. Reports muscle cramps denies chest pain , difficulty breathing, dizziness, blurred vision, weakness on either side of body. No slurred speech noted. Patient reports he did not take am insulin humalog or lantus. Patient reports he will take medication when he gets home and feels he ate lunch prior to getting labs drawn and ate a few "M&M"s prior to appt today. Reviewed with patient need to get evaluated in ED. Patient reports if he has symptoms or glucose remains elevated after rechecking he will go to ED. Reviewed with patient potassium level is too high as well and must be decreased to prevent possible heart issues. Patient reports he will call back to report glucose levels after taking insulin.  Reason for Disposition  Blood glucose > 500 mg/dL (27.8 mmol/L)  Answer Assessment - Initial Assessment Questions 1. BLOOD GLUCOSE: "What is your blood glucose level?"      Patient not able to check at this time  2. ONSET: "When did you check the blood glucose?"     Labs collected at cancer center  3. USUAL RANGE: "What is your glucose level usually?" (e.g., usual fasting morning value, usual evening value)     na 4. KETONES: "Do you check for ketones (urine or blood test strips)?" If yes, ask: "What does the test show  now?"      na 5. TYPE 1 or 2:  "Do you know what type of diabetes you have?"  (e.g., Type 1, Type 2, Gestational; doesn't know)      na 6. INSULIN: "Do you take insulin?" "What type of insulin(s) do you use? What is the mode of delivery? (syringe, pen; injection or pump)?"      Yes humalog and lantus 7. DIABETES PILLS: "Do you take any pills for your diabetes?" If yes, ask: "Have you missed taking any pills recently?"     No pills , did not take insulin this am  8. OTHER SYMPTOMS: "Do you have any symptoms?" (e.g., fever, frequent urination, difficulty breathing, dizziness, weakness, vomiting)     Denies . Only muscle cramps 9. PREGNANCY: "Is there any chance you are pregnant?" "When was your last menstrual period?"     na  Protocols used: Diabetes - High Blood Sugar-A-AH

## 2021-04-09 NOTE — Progress Notes (Signed)
CMP came back with Glucose 666, K+ 5.5 and NA+ 129.  Pt had just left the exam room. Called pt on phone and reached him before he left the [place]. Had him return to the lobby. Spoke with him and advised of lab results. Advised that Dr. Lorenso Courier recommends pt to go to the ED. Pt declined that option. Advised that it could be dangerous for him to leave this untreated. Advised he at least call his PCP for recommended actions.  He stated he would. Advised that the cancer center in [facility] will not treat him tomorrow if his glucose levels remain that high. Call made to Dr. Army Melia Fleming's office at [facility] to advise of the situation.  Spoke with Triage nurse and provided her with the above information.  She states she will give the information to Dr. Raul Del  Notified [facility] about pt's lab results today. They will re-check CMP prior to his proposed treatment.

## 2021-04-10 ENCOUNTER — Other Ambulatory Visit: Payer: Self-pay

## 2021-04-10 ENCOUNTER — Other Ambulatory Visit (HOSPITAL_COMMUNITY): Payer: Self-pay

## 2021-04-10 ENCOUNTER — Encounter: Payer: Self-pay | Admitting: Hematology and Oncology

## 2021-04-10 ENCOUNTER — Inpatient Hospital Stay: Payer: Medicaid Other

## 2021-04-10 VITALS — BP 155/77 | HR 87 | Temp 99.1°F | Resp 18

## 2021-04-10 DIAGNOSIS — C250 Malignant neoplasm of head of pancreas: Secondary | ICD-10-CM

## 2021-04-10 DIAGNOSIS — Z5111 Encounter for antineoplastic chemotherapy: Secondary | ICD-10-CM | POA: Diagnosis not present

## 2021-04-10 DIAGNOSIS — Z95828 Presence of other vascular implants and grafts: Secondary | ICD-10-CM

## 2021-04-10 LAB — CMP (CANCER CENTER ONLY)
ALT: 12 U/L (ref 0–44)
AST: 21 U/L (ref 15–41)
Albumin: 3.3 g/dL — ABNORMAL LOW (ref 3.5–5.0)
Alkaline Phosphatase: 246 U/L — ABNORMAL HIGH (ref 38–126)
Anion gap: 7 (ref 5–15)
BUN: 12 mg/dL (ref 6–20)
CO2: 28 mmol/L (ref 22–32)
Calcium: 9 mg/dL (ref 8.9–10.3)
Chloride: 92 mmol/L — ABNORMAL LOW (ref 98–111)
Creatinine: 0.61 mg/dL (ref 0.61–1.24)
GFR, Estimated: >60 mL/min (ref >60)
Glucose, Bld: 390 mg/dL — ABNORMAL HIGH (ref 70–99)
Potassium: 5 mmol/L (ref 3.5–5.1)
Sodium: 127 mmol/L — ABNORMAL LOW (ref 135–145)
Total Bilirubin: 3.3 mg/dL — ABNORMAL HIGH (ref 0.3–1.2)
Total Protein: 7.1 g/dL (ref 6.5–8.1)

## 2021-04-10 LAB — CEA (IN HOUSE-CHCC): CEA (CHCC-In House): 12.69 ng/mL — ABNORMAL HIGH (ref 0.00–5.00)

## 2021-04-10 LAB — CANCER ANTIGEN 19-9: CA 19-9: 649 U/mL — ABNORMAL HIGH (ref 0–35)

## 2021-04-10 MED ORDER — PROCHLORPERAZINE MALEATE 10 MG PO TABS
10.0000 mg | ORAL_TABLET | Freq: Four times a day (QID) | ORAL | 1 refills | Status: DC | PRN
  Filled 2021-04-10: qty 30, 8d supply, fill #0
  Filled 2021-08-06: qty 30, 8d supply, fill #1

## 2021-04-10 MED ORDER — SODIUM CHLORIDE 0.9 % IV SOLN
2400.0000 mg/m2 | INTRAVENOUS | Status: DC
  Administered 2021-04-10: 4500 mg via INTRAVENOUS
  Filled 2021-04-10: qty 90

## 2021-04-10 MED ORDER — SODIUM CHLORIDE 0.9 % IV SOLN
150.0000 mg | Freq: Once | INTRAVENOUS | Status: AC
  Administered 2021-04-10: 150 mg via INTRAVENOUS
  Filled 2021-04-10: qty 150

## 2021-04-10 MED ORDER — SODIUM CHLORIDE 0.9% FLUSH
10.0000 mL | INTRAVENOUS | Status: DC | PRN
  Filled 2021-04-10: qty 10

## 2021-04-10 MED ORDER — SODIUM CHLORIDE 0.9 % IV SOLN
400.0000 mg/m2 | Freq: Once | INTRAVENOUS | Status: AC
  Administered 2021-04-10: 748 mg via INTRAVENOUS
  Filled 2021-04-10: qty 37.4

## 2021-04-10 MED ORDER — SODIUM CHLORIDE 0.9 % IV SOLN
150.0000 mg/m2 | Freq: Once | INTRAVENOUS | Status: AC
  Administered 2021-04-10: 280 mg via INTRAVENOUS
  Filled 2021-04-10: qty 10

## 2021-04-10 MED ORDER — OXALIPLATIN CHEMO INJECTION 100 MG/20ML
85.0000 mg/m2 | Freq: Once | INTRAVENOUS | Status: AC
  Administered 2021-04-10: 160 mg via INTRAVENOUS
  Filled 2021-04-10: qty 32

## 2021-04-10 MED ORDER — LIDOCAINE-PRILOCAINE 2.5-2.5 % EX CREA
TOPICAL_CREAM | CUTANEOUS | 3 refills | Status: DC
  Filled 2021-04-10: qty 30, 30d supply, fill #0

## 2021-04-10 MED ORDER — DEXTROSE 5 % IV SOLN
Freq: Once | INTRAVENOUS | Status: AC
  Filled 2021-04-10: qty 250

## 2021-04-10 MED ORDER — ATROPINE SULFATE 1 MG/ML IJ SOLN
0.5000 mg | Freq: Once | INTRAMUSCULAR | Status: AC | PRN
  Administered 2021-04-10: 0.5 mg via INTRAVENOUS

## 2021-04-10 MED ORDER — SODIUM CHLORIDE 0.9 % IV SOLN
10.0000 mg | Freq: Once | INTRAVENOUS | Status: AC
  Administered 2021-04-10: 10 mg via INTRAVENOUS
  Filled 2021-04-10: qty 10

## 2021-04-10 MED ORDER — HEPARIN SOD (PORK) LOCK FLUSH 100 UNIT/ML IV SOLN
500.0000 [IU] | Freq: Once | INTRAVENOUS | Status: DC | PRN
  Filled 2021-04-10: qty 5

## 2021-04-10 MED ORDER — PALONOSETRON HCL INJECTION 0.25 MG/5ML
0.2500 mg | Freq: Once | INTRAVENOUS | Status: AC
  Administered 2021-04-10: 0.25 mg via INTRAVENOUS

## 2021-04-10 MED ORDER — ONDANSETRON HCL 8 MG PO TABS
8.0000 mg | ORAL_TABLET | Freq: Two times a day (BID) | ORAL | 1 refills | Status: DC | PRN
  Filled 2021-04-10: qty 30, 15d supply, fill #0
  Filled 2021-08-06: qty 30, 15d supply, fill #1

## 2021-04-10 NOTE — Patient Instructions (Signed)
[facility] [place]  Discharge Instructions: Thank you for choosing [facility] to provide your oncology and hematology care.   If you have a lab appointment with the [facility], please go directly to the [facility] and check in at the registration area.  Wear comfortable clothing and clothing appropriate for easy access to any Portacath or PICC line.   We strive to give you quality time with your provider. You may need to reschedule your appointment if you arrive late (15 or more minutes).  Arriving late affects you and other patients whose appointments are after yours.  Also, if you miss three or more appointments without notifying the office, you may be dismissed from the clinic at the provider's discretion.      For prescription refill requests, have your pharmacy contact our office and allow 72 hours for refills to be completed.    Today you received the following chemotherapy and/or immunotherapy agents Oxaliplatin, Irinotecan, Leucovorin and 5FU      To help prevent nausea and vomiting after your treatment, we encourage you to take your nausea medication as directed.  BELOW ARE SYMPTOMS THAT SHOULD BE REPORTED IMMEDIATELY: *FEVER GREATER THAN 100.4 F (38 C) OR HIGHER *CHILLS OR SWEATING *NAUSEA AND VOMITING THAT IS NOT CONTROLLED WITH YOUR NAUSEA MEDICATION *UNUSUAL SHORTNESS OF BREATH *UNUSUAL BRUISING OR BLEEDING *URINARY PROBLEMS (pain or burning when urinating, or frequent urination) *BOWEL PROBLEMS (unusual diarrhea, constipation, pain near the anus) TENDERNESS IN MOUTH AND THROAT WITH OR WITHOUT PRESENCE OF ULCERS (sore throat, sores in mouth, or a toothache) UNUSUAL RASH, SWELLING OR PAIN  UNUSUAL VAGINAL DISCHARGE OR ITCHING   Items with * indicate a potential emergency and should be followed up as soon as possible or go to the Emergency Department if any problems should occur.  Please show the CHEMOTHERAPY ALERT CARD or  IMMUNOTHERAPY ALERT CARD at check-in to the Emergency Department and triage nurse. Should you have questions after your visit or need to cancel or reschedule your appointment, please contact [facility]  [phone] and follow the prompts.  Office hours are 8:00 a.m. to 4:30 p.m. Monday - Friday. Please note that voicemails left after 4:00 p.m. may not be returned until the following business day.  We are closed weekends and major holidays. You have access to a nurse at all times for urgent questions. Please call the main number to the clinic [phone] and follow the prompts.  For any non-urgent questions, you may also contact your provider using MyChart. We now offer e-Visits for anyone 64 and older to request care online for non-urgent symptoms. For details visit [email or web address].   Also download the MyChart app! Go to the app store, search "MyChart", open the app, select [facility], and log in with your MyChart username and password.  Due to Covid, a mask is required upon entering the hospital/clinic. If you do not have a mask, one will be given to you upon arrival. For doctor visits, patients may have 1 support person aged 40 or older with them. For treatment visits, patients cannot have anyone with them due to current Covid guidelines and our immunocompromised population.   The chemotherapy medication bag should finish at 46 hours, 96 hours, or 7 days. For example, if your pump is scheduled for 46 hours and it was put on at 4:00 p.m., it should finish at 2:00 p.m. the day it is scheduled to come off regardless of your  appointment time.     Estimated time to finish at 1400.   If the display on your pump reads "Low Volume" and it is beeping, take the batteries out of the pump and come to the cancer center for it to be taken off.   If the pump alarms go off prior to the pump reading "Low Volume" then call [phone] and someone can assist you.  If the  plunger comes out and the chemotherapy medication is leaking out, please use your home chemo spill kit to clean up the spill. Do NOT use paper towels or other household products.  If you have problems or questions regarding your pump, please call either [phone] (24 hours a day) or the cancer center Monday-Friday 8:00 a.m.- 4:30 p.m. at the clinic number and we will assist you. If you are unable to get assistance, then go to the nearest Emergency Department and ask the staff to contact the IV team for assistance.

## 2021-04-10 NOTE — Progress Notes (Signed)
Created GFE(Good Faith Estimate) to provide to patient. Sent in my chart as well.

## 2021-04-11 ENCOUNTER — Other Ambulatory Visit: Payer: Self-pay

## 2021-04-11 ENCOUNTER — Encounter: Payer: Self-pay | Admitting: Hematology and Oncology

## 2021-04-11 MED ORDER — TECHLITE PEN NEEDLES 32G X 4 MM MISC
11 refills | Status: DC
  Filled 2021-04-11: qty 100, 50d supply, fill #0
  Filled 2021-05-20: qty 100, 50d supply, fill #1
  Filled 2021-07-03: qty 100, 50d supply, fill #2
  Filled 2021-09-10 (×2): qty 100, 50d supply, fill #0

## 2021-04-11 NOTE — Telephone Encounter (Signed)
Agree with advice given. Needs to go to ED as soon as possible

## 2021-04-12 ENCOUNTER — Other Ambulatory Visit: Payer: Self-pay

## 2021-04-12 ENCOUNTER — Inpatient Hospital Stay: Payer: Medicaid Other

## 2021-04-12 VITALS — BP 152/84 | HR 80 | Temp 97.8°F | Resp 17

## 2021-04-12 DIAGNOSIS — C250 Malignant neoplasm of head of pancreas: Secondary | ICD-10-CM

## 2021-04-12 DIAGNOSIS — Z5111 Encounter for antineoplastic chemotherapy: Secondary | ICD-10-CM | POA: Diagnosis not present

## 2021-04-12 MED ORDER — HEPARIN SOD (PORK) LOCK FLUSH 100 UNIT/ML IV SOLN
500.0000 [IU] | Freq: Once | INTRAVENOUS | Status: AC | PRN
  Administered 2021-04-12: 500 [IU]
  Filled 2021-04-12: qty 5

## 2021-04-12 MED ORDER — SODIUM CHLORIDE 0.9% FLUSH
10.0000 mL | INTRAVENOUS | Status: DC | PRN
  Administered 2021-04-12: 10 mL
  Filled 2021-04-12: qty 10

## 2021-04-15 ENCOUNTER — Ambulatory Visit: Payer: Self-pay | Admitting: Nurse Practitioner

## 2021-04-16 ENCOUNTER — Telehealth: Payer: Self-pay | Admitting: *Deleted

## 2021-04-16 ENCOUNTER — Other Ambulatory Visit: Payer: Self-pay | Admitting: *Deleted

## 2021-04-16 ENCOUNTER — Other Ambulatory Visit (HOSPITAL_COMMUNITY): Payer: Self-pay

## 2021-04-16 ENCOUNTER — Encounter: Payer: Self-pay | Admitting: Hematology and Oncology

## 2021-04-16 MED ORDER — NYSTATIN 100000 UNIT/ML MT SUSP
OROMUCOSAL | 0 refills | Status: DC
  Filled 2021-04-16: qty 400, 20d supply, fill #0

## 2021-04-16 MED ORDER — MAGIC MOUTHWASH W/LIDOCAINE: 5.0000 mL | Freq: Four times a day (QID) | ORAL | 0 refills | Status: DC | PRN

## 2021-04-16 NOTE — Telephone Encounter (Signed)
Likely secondary to 5FU chemotherapy. Recommend magic mouthwash. If he is unable to tolerate food, he will need to come in for IV foods. Agree to try antiemetics sooner and scheduled if needed.

## 2021-04-16 NOTE — Telephone Encounter (Signed)
Received  call from pt stating that he has developed mouth sores which are very painful, making it somewhat difficult to eat though he is pushing himself.  He has tried warm salt water rinses without success.  He is asking for something else to make his mouth feel better. He also states he had nausea and vomiting over the weekend to Monday. That has calmed down at this point. Reviewed his antiemetics with him and he voices understanding to start them earlier-as soon as he feels queasy.  Please adv ise about mouth sores

## 2021-04-16 NOTE — Addendum Note (Signed)
Addended by: Dede Query T on: 04/16/2021 11:54 AM   Modules accepted: Orders

## 2021-04-16 NOTE — Telephone Encounter (Signed)
Call made to pt regarding his magic mouthwash. Spoke with pt and he has already picked it up. Reviewed with him on how to use it. He voiced understanding. Advised it was ok to swallow after swishing in mouth if pain further back in his throat. He voiced understanding.

## 2021-04-17 ENCOUNTER — Encounter: Payer: Self-pay | Admitting: Hematology and Oncology

## 2021-04-17 NOTE — Telephone Encounter (Signed)
Called pt about his magic mouthwash-he has picked it up. Reviewed how he needs to use it.  He voiced understanding

## 2021-04-23 ENCOUNTER — Other Ambulatory Visit (HOSPITAL_COMMUNITY): Payer: Self-pay

## 2021-04-24 ENCOUNTER — Encounter: Payer: Self-pay | Admitting: Hematology and Oncology

## 2021-04-24 ENCOUNTER — Inpatient Hospital Stay: Payer: Medicaid Other

## 2021-04-24 ENCOUNTER — Other Ambulatory Visit: Payer: Self-pay | Admitting: Hematology and Oncology

## 2021-04-24 ENCOUNTER — Other Ambulatory Visit: Payer: Self-pay

## 2021-04-24 ENCOUNTER — Inpatient Hospital Stay (HOSPITAL_BASED_OUTPATIENT_CLINIC_OR_DEPARTMENT_OTHER): Payer: Medicaid Other | Admitting: Hematology and Oncology

## 2021-04-24 VITALS — BP 141/75 | HR 85 | Temp 98.0°F | Resp 19 | Ht 71.0 in | Wt 155.4 lb

## 2021-04-24 DIAGNOSIS — K8689 Other specified diseases of pancreas: Secondary | ICD-10-CM | POA: Diagnosis not present

## 2021-04-24 DIAGNOSIS — C250 Malignant neoplasm of head of pancreas: Secondary | ICD-10-CM

## 2021-04-24 DIAGNOSIS — Z95828 Presence of other vascular implants and grafts: Secondary | ICD-10-CM | POA: Diagnosis not present

## 2021-04-24 DIAGNOSIS — Z5111 Encounter for antineoplastic chemotherapy: Secondary | ICD-10-CM | POA: Diagnosis not present

## 2021-04-24 LAB — CBC WITH DIFFERENTIAL (CANCER CENTER ONLY)
Abs Immature Granulocytes: 0.02 10*3/uL (ref 0.00–0.07)
Basophils Absolute: 0.1 10*3/uL (ref 0.0–0.1)
Basophils Relative: 1 %
Eosinophils Absolute: 0.3 10*3/uL (ref 0.0–0.5)
Eosinophils Relative: 3 %
HCT: 34.9 % — ABNORMAL LOW (ref 39.0–52.0)
Hemoglobin: 11.8 g/dL — ABNORMAL LOW (ref 13.0–17.0)
Immature Granulocytes: 0 %
Lymphocytes Relative: 25 %
Lymphs Abs: 2.6 10*3/uL (ref 0.7–4.0)
MCH: 31.2 pg (ref 26.0–34.0)
MCHC: 33.8 g/dL (ref 30.0–36.0)
MCV: 92.3 fL (ref 80.0–100.0)
Monocytes Absolute: 1.5 10*3/uL — ABNORMAL HIGH (ref 0.1–1.0)
Monocytes Relative: 14 %
Neutro Abs: 5.9 10*3/uL (ref 1.7–7.7)
Neutrophils Relative %: 57 %
Platelet Count: 250 10*3/uL (ref 150–400)
RBC: 3.78 MIL/uL — ABNORMAL LOW (ref 4.22–5.81)
RDW: 13.4 % (ref 11.5–15.5)
WBC Count: 10.4 10*3/uL (ref 4.0–10.5)
nRBC: 0 % (ref 0.0–0.2)

## 2021-04-24 LAB — CMP (CANCER CENTER ONLY)
ALT: 13 U/L (ref 0–44)
AST: 24 U/L (ref 15–41)
Albumin: 3.2 g/dL — ABNORMAL LOW (ref 3.5–5.0)
Alkaline Phosphatase: 146 U/L — ABNORMAL HIGH (ref 38–126)
Anion gap: 7 (ref 5–15)
BUN: 14 mg/dL (ref 6–20)
CO2: 26 mmol/L (ref 22–32)
Calcium: 9.4 mg/dL (ref 8.9–10.3)
Chloride: 104 mmol/L (ref 98–111)
Creatinine: 0.67 mg/dL (ref 0.61–1.24)
GFR, Estimated: >60 mL/min (ref >60)
Glucose, Bld: 215 mg/dL — ABNORMAL HIGH (ref 70–99)
Potassium: 4.2 mmol/L (ref 3.5–5.1)
Sodium: 137 mmol/L (ref 135–145)
Total Bilirubin: 1.6 mg/dL — ABNORMAL HIGH (ref 0.3–1.2)
Total Protein: 7.6 g/dL (ref 6.5–8.1)

## 2021-04-24 MED ORDER — PALONOSETRON HCL INJECTION 0.25 MG/5ML
0.2500 mg | Freq: Once | INTRAVENOUS | Status: AC
  Administered 2021-04-24: 0.25 mg via INTRAVENOUS
  Filled 2021-04-24: qty 5

## 2021-04-24 MED ORDER — DEXTROSE 5 % IV SOLN: Freq: Once | INTRAVENOUS | Status: AC

## 2021-04-24 MED ORDER — ATROPINE SULFATE 1 MG/ML IJ SOLN
0.5000 mg | Freq: Once | INTRAMUSCULAR | Status: AC | PRN
  Administered 2021-04-24: 0.5 mg via INTRAVENOUS
  Filled 2021-04-24: qty 1

## 2021-04-24 MED ORDER — HEPARIN SOD (PORK) LOCK FLUSH 100 UNIT/ML IV SOLN: 500.0000 [IU] | Freq: Once | INTRAVENOUS | Status: DC | PRN

## 2021-04-24 MED ORDER — SODIUM CHLORIDE 0.9 % IV SOLN
2400.0000 mg/m2 | INTRAVENOUS | Status: DC
  Administered 2021-04-24: 4500 mg via INTRAVENOUS
  Filled 2021-04-24: qty 90

## 2021-04-24 MED ORDER — SODIUM CHLORIDE 0.9% FLUSH
10.0000 mL | Freq: Once | INTRAVENOUS | Status: AC
  Administered 2021-04-24: 10 mL

## 2021-04-24 MED ORDER — SODIUM CHLORIDE 0.9% FLUSH: 10.0000 mL | INTRAVENOUS | Status: DC | PRN

## 2021-04-24 MED ORDER — SODIUM CHLORIDE 0.9 % IV SOLN
400.0000 mg/m2 | Freq: Once | INTRAVENOUS | Status: AC
  Administered 2021-04-24: 748 mg via INTRAVENOUS
  Filled 2021-04-24: qty 37.4

## 2021-04-24 MED ORDER — SODIUM CHLORIDE 0.9 % IV SOLN
10.0000 mg | Freq: Once | INTRAVENOUS | Status: AC
  Administered 2021-04-24: 10 mg via INTRAVENOUS
  Filled 2021-04-24: qty 10

## 2021-04-24 MED ORDER — OXALIPLATIN CHEMO INJECTION 100 MG/20ML
85.0000 mg/m2 | Freq: Once | INTRAVENOUS | Status: AC
  Administered 2021-04-24: 160 mg via INTRAVENOUS
  Filled 2021-04-24: qty 32

## 2021-04-24 MED ORDER — SODIUM CHLORIDE 0.9 % IV SOLN
150.0000 mg/m2 | Freq: Once | INTRAVENOUS | Status: AC
  Administered 2021-04-24: 280 mg via INTRAVENOUS
  Filled 2021-04-24: qty 14

## 2021-04-24 MED ORDER — SODIUM CHLORIDE 0.9 % IV SOLN
150.0000 mg | Freq: Once | INTRAVENOUS | Status: AC
  Administered 2021-04-24: 150 mg via INTRAVENOUS
  Filled 2021-04-24: qty 150

## 2021-04-24 NOTE — Progress Notes (Signed)
OK to treat with TBili of 1.6 per Dr. Lorenso Courier

## 2021-04-24 NOTE — Progress Notes (Signed)
I met with Mr [name] [name] and his mother prior to his f/u appt with Dr Lorenso Courier.  He developed mouth sores after his first cycle of chemotherapy and was prescribed magic mouthwash.  He states that the at helped.  I gave him a handout on mouth sores and helpful hints for po intake.  He verbalized understanding.

## 2021-04-24 NOTE — Progress Notes (Signed)
[facility] Telephone:([phone]   [phone]  PROGRESS NOTE  Patient Care Team: [name], NP as PCP - General (Nurse Practitioner) Havery Moros, Carlota Raspberry, MD as Consulting Physician (Gastroenterology) Orson Slick, MD as Consulting Physician (Oncology) Royston Bake, RN as Registered Nurse  Hematological/Oncological History # Adenocarcinoma of the Pancreas, Stage IIB 02/26/2021: patient seen by GI for jaundice and pancreatic mass causing CBD obstruction. CT abdomen showed pancreatic head mass (4.4 x 3.7 cm) and enlarged portocaval lymph nodes. 02/27/2021: ERCP with placement of a nonmetallic biliary stent. FNA of pancreatic head showed malignant cells consistent with pancreatic adenocarcinoma. 03/05/2021: CT Chest showed no evidence of metastatic disease in the chest. 03/12/2021: establish care with Dr. Lorenso Courier 04/10/2021: Cycle 1 Day 1 of FOLFIRINOX 04/24/2021: Cycle 2 Day 1 of FOLFIRINOX  Interval History:  [name] [name] 60 y.o. male with medical history significant for borderline resectable pancreatic cancer presents for a follow up visit. The patient's last visit was on 04/09/2021. In the interim since the last visit he started Cycle 1 of FOLFIRINOX  On exam today [name] [name] is accompanied by his mom. He reports he tolerated the first cycle of chemotherapy quite well.  He notes he did not have any issues with nausea or vomiting but continues to feel that "knot in his stomach".  He notes that he does have some occasional soft stools but it has been "workable".  His weight is been stable at 105 pounds and his appetite has been good.  He has been having some flatulence which has been only a mild nuisance.  He did develop mouth sores for the first cycle but was prescribed Magic mouthwash and this has been helping to resolve the lesions.  He otherwise denies any fevers, chills, sweats, shortness of breath, chest pain.  Full 10 point ROS is listed below.  He is  willing and able to proceed with treatment at this time.  MEDICAL HISTORY:  Past Medical History:  Diagnosis Date   Barrett's esophagus dx 2016   Bronchitis    Chronic hepatitis C without hepatic coma ([place]) 10/31/2014   Depression    Diabetes ([facility])    ED (erectile dysfunction)    GERD (gastroesophageal reflux disease)    Hepatitis C    Hiatal hernia 10/2014   3cm   Neuropathy    S/P transmetatarsal amputation of foot, left ([place]) 11/28/2020    SURGICAL HISTORY: Past Surgical History:  Procedure Laterality Date   AMPUTATION Left 11/15/2020   Procedure: LEFT TRANSMETATARSALS AMPUTATION;  Surgeon: Newt Minion, MD;  Location: [facility];  Service: Orthopedics;  Laterality: Left;   APPENDECTOMY     BILIARY STENT PLACEMENT N/A 02/27/2021   Procedure: BILIARY STENT PLACEMENT;  Surgeon: Milus Banister, MD;  Location: [facility];  Service: Endoscopy;  Laterality: N/A;   BILIARY STENT PLACEMENT N/A 03/27/2021   Procedure: BILIARY STENT PLACEMENT;  Surgeon: Jackquline Denmark, MD;  Location: [facility];  Service: Endoscopy;  Laterality: N/A;   BIOPSY  03/27/2021   Procedure: BIOPSY;  Surgeon: Jackquline Denmark, MD;  Location: [facility];  Service: Endoscopy;;   COLONOSCOPY N/A 02/16/2014   Procedure: COLONOSCOPY;  Surgeon: Gatha Mayer, MD;  Location: [facility];  Service: Endoscopy;  Laterality: N/A;   COLONOSCOPY     ENDOSCOPIC RETROGRADE CHOLANGIOPANCREATOGRAPHY (ERCP) WITH PROPOFOL N/A 02/27/2021   Procedure: ENDOSCOPIC RETROGRADE CHOLANGIOPANCREATOGRAPHY (ERCP) WITH PROPOFOL;  Surgeon: Milus Banister, MD;  [facility]: [facility];  Service: Endoscopy;  Laterality: N/A;  ERCP N/A 03/27/2021   Procedure: ENDOSCOPIC RETROGRADE CHOLANGIOPANCREATOGRAPHY (ERCP);  Surgeon: Jackquline Denmark, MD;  Location: [facility];  Service: Endoscopy;  Laterality: N/A;   EUS N/A 02/27/2021   Procedure: UPPER ENDOSCOPIC ULTRASOUND (EUS) RADIAL;  Surgeon: Milus Banister, MD;  Location: [facility];  Service:  Endoscopy;  Laterality: N/A;   FINE NEEDLE ASPIRATION N/A 02/27/2021   Procedure: FINE NEEDLE ASPIRATION (FNA) LINEAR;  Surgeon: Milus Banister, MD;  Location: [facility];  Service: Endoscopy;  Laterality: N/A;   IR IMAGING GUIDED PORT INSERTION  03/21/2021   SPHINCTEROTOMY  02/27/2021   Procedure: SPHINCTEROTOMY;  Surgeon: Milus Banister, MD;  Location: [facility];  Service: Endoscopy;;   STENT REMOVAL  03/27/2021   Procedure: Lavell Islam REMOVAL;  Surgeon: Jackquline Denmark, MD;  Location: [facility];  Service: Endoscopy;;   TONSILLECTOMY      SOCIAL HISTORY: Social History   Socioeconomic History   Marital status: Single    Spouse name: Not on file   Number of children: Not on file   Years of education: Not on file   Highest education level: Not on file  Occupational History   Not on file  Tobacco Use   Smoking status: Every Day    Packs/day: 0.50    Years: 35.00    Pack years: 17.50    Types: Cigarettes   Smokeless tobacco: Never  Vaping Use   Vaping Use: Never used  Substance and Sexual Activity   Alcohol use: Not Currently    Comment: previous   Drug use: No   Sexual activity: Not on file  Other Topics Concern   Not on file  Social History Narrative   Not on file   Social Determinants of Health   Financial Resource Strain: High Risk   Difficulty of Paying Living Expenses: Very hard  Food Insecurity: Not on file  Transportation Needs: Unmet Transportation Needs   Lack of Transportation (Medical): Yes   Lack of Transportation (Non-Medical): Yes  Physical Activity: Not on file  Stress: Not on file  Social Connections: Not on file  Intimate Partner Violence: Not on file    FAMILY HISTORY: Family History  Problem Relation Age of Onset   Breast cancer Mother    Stroke Father    Alcohol abuse Father    Heart disease Maternal Grandfather    Pancreatic cancer Paternal Grandmother    Diabetes Paternal Grandfather    Colon cancer Neg Hx    Stomach cancer Neg Hx     Rectal cancer Neg Hx    Esophageal cancer Neg Hx     ALLERGIES:  is allergic to bee venom and lactose intolerance (gi).  MEDICATIONS:  Current Outpatient Medications  Medication Sig Dispense Refill   albuterol (VENTOLIN HFA) 108 (90 Base) MCG/ACT inhaler Inhale 2 puffs into the lungs every 6 (six) hours as needed for wheezing or shortness of breath. 18 g 0   diphenoxylate-atropine (LOMOTIL) 2.5-0.025 MG tablet Take 1 tablet by mouth 4 (four) times daily as needed for diarrhea or loose stools. 60 tablet 1   DULoxetine (CYMBALTA) 60 MG capsule Take 1 capsule (60 mg total) by mouth daily. 90 capsule 0   EPINEPHrine 0.3 mg/0.3 mL IJ SOAJ injection Inject 0.3 mg into the muscle as needed for anaphylaxis.     folic acid (FOLVITE) 1 MG tablet Take 1 tablet (1 mg total) by mouth in the morning.     glucose blood (TRUE METRIX BLOOD GLUCOSE TEST) test strip Use as instructed. Check  blood glucose level by fingerstick 3-4 times per day.  E11.65 200 each 12   insulin glargine (LANTUS) 100 UNIT/ML injection Inject 0.2 mLs (20 Units total) into the skin 2 (two) times daily. 10 mL 11   insulin lispro (HUMALOG) 100 UNIT/ML KwikPen Inject 0-9 Units into the skin 3 (three) times daily. Sliding scale CBG 70 - 120: 0 units CBG 121 - 150: 1 unit,  CBG 151 - 200: 2 units,  CBG 201 - 250: 3 units,  CBG 251 - 300: 5 units,  CBG 301 - 350: 7 units,  CBG 351 - 400: 9 units   CBG > 400: 9 units and notify your MD 15 mL 11   insulin lispro (HUMALOG) 200 UNIT/ML KwikPen Inject 7 Units into the skin 3 (three) times daily before meals. 15 mL 3   Insulin Pen Needle (TECHLITE PEN NEEDLES) 32G X 4 MM MISC USE AS INSTRUCTED INTO THE SKIN TWICE DAILY 100 each 11   Insulin Pen Needle (TRUEPLUS PEN NEEDLES) 32G X 4 MM MISC Use as instructed. Inject into the skin twice daily 200 each 6   lidocaine-diphenhydrAMINE-alum & mag hydroxide-simeth-distilled water in nystatin suspension Swish/gargle and spit/swallow 5 ml by mouth 4 times a  day as needed. 400 mL 0   lidocaine-prilocaine (EMLA) cream Apply 1 application topically as directed as needed. 30 g 0   lidocaine-prilocaine (EMLA) cream Apply to affected area once 30 g 3   lipase/protease/amylase (CREON) 36000 UNITS CPEP capsule Take 2 capsules (72,000 Units total) by mouth 3 (three) times daily with meals. May also take 1 capsule (36,000 Units total) as needed (with snacks). NEEDS PASS. 210 capsule 3   losartan (COZAAR) 50 MG tablet Take 1 tablet (50 mg total) by mouth daily.     magic mouthwash w/lidocaine SOLN Take 5 mLs by mouth 4 (four) times daily as needed. 400 mL 0   mirtazapine (REMERON) 30 MG tablet Take 1 tablet (30 mg total) by mouth at bedtime. 90 tablet 1   Multiple Vitamins-Minerals (THEREMS-M) TABS Take 1 tablet by mouth in the morning.     omeprazole (PRILOSEC) 40 MG capsule Take 1 capsule (40 mg total) by mouth in the morning.     ondansetron (ZOFRAN) 8 MG tablet Take 1 tablet (8 mg total) by mouth 2 (two) times daily as needed. Start on day 3 after chemotherapy. 30 tablet 1   oxyCODONE (OXY IR/ROXICODONE) 5 MG immediate release tablet Take 1-2 tablets (5-10 mg total) by mouth every 6 (six) hours as needed for severe pain. 60 tablet 0   prochlorperazine (COMPAZINE) 10 MG tablet Take 1 tablet (10 mg total) by mouth every 6 (six) hours as needed for nausea or vomiting. 30 tablet 0   prochlorperazine (COMPAZINE) 10 MG tablet Take 1 tablet (10 mg total) by mouth every 6 (six) hours as needed (Nausea or vomiting). 30 tablet 1   TRUEplus Lancets 28G MISC Use as instructed. Check blood glucose level by fingerstick 3-4 times per day.   E11.65 200 each 3   No current facility-administered medications for this visit.   Facility-Administered Medications Ordered in Other Visits  Medication Dose Route Frequency Provider Last Rate Last Admin   atropine injection 0.5 mg  0.5 mg Intravenous Once PRN Orson Slick, MD       dexamethasone (DECADRON) 10 mg in sodium  chloride 0.9 % 50 mL IVPB  10 mg Intravenous Once Orson Slick, MD       dextrose  5 % solution   Intravenous Once Ledell Peoples IV, MD       fluorouracil (ADRUCIL) 4,500 mg in sodium chloride 0.9 % 60 mL chemo infusion  2,400 mg/m2 (Treatment Plan Recorded) Intravenous 1 day or 1 dose Orson Slick, MD       fosaprepitant (EMEND) 150 mg in sodium chloride 0.9 % 145 mL IVPB  150 mg Intravenous Once Ledell Peoples IV, MD       heparin lock flush 100 unit/mL  500 Units Intracatheter Once PRN Orson Slick, MD       irinotecan (CAMPTOSAR) 280 mg in sodium chloride 0.9 % 500 mL chemo infusion  150 mg/m2 (Treatment Plan Recorded) Intravenous Once Orson Slick, MD       leucovorin 748 mg in sodium chloride 0.9 % 250 mL infusion  400 mg/m2 (Treatment Plan Recorded) Intravenous Once Orson Slick, MD       oxaliplatin (ELOXATIN) 160 mg in dextrose 5 % 500 mL chemo infusion  85 mg/m2 (Treatment Plan Recorded) Intravenous Once Orson Slick, MD       palonosetron (ALOXI) injection 0.25 mg  0.25 mg Intravenous Once Ledell Peoples IV, MD       sodium chloride flush (NS) 0.9 % injection 10 mL  10 mL Intracatheter PRN Orson Slick, MD        REVIEW OF SYSTEMS:   Constitutional: ( - ) fevers, ( - )  chills , ( - ) night sweats Eyes: ( - ) blurriness of vision, ( - ) double vision, ( - ) watery eyes Ears, nose, mouth, throat, and face: ( - ) mucositis, ( - ) sore throat Respiratory: ( - ) cough, ( - ) dyspnea, ( - ) wheezes Cardiovascular: ( - ) palpitation, ( - ) chest discomfort, ( - ) lower extremity swelling Gastrointestinal:  ( - ) nausea, ( - ) heartburn, ( - ) change in bowel habits Skin: ( - ) abnormal skin rashes Lymphatics: ( - ) new lymphadenopathy, ( - ) easy bruising Neurological: ( - ) numbness, ( - ) tingling, ( - ) new weaknesses Behavioral/Psych: ( - ) mood change, ( - ) new changes  All other systems were reviewed with the patient and are negative.  PHYSICAL  EXAMINATION: ECOG PERFORMANCE STATUS: 1 - Symptomatic but completely ambulatory  Vitals:   04/24/21 1057  BP: (!) 141/75  Pulse: 85  Resp: 19  Temp: 98 F (36.7 C)  SpO2: 100%    Filed Weights   04/24/21 1057  Weight: 155 lb 6.4 oz (70.5 kg)     GENERAL: Well-appearing middle-age Caucasian male, alert, no distress and comfortable SKIN: skin color, texture, turgor are normal, no rashes or significant lesions. Jaundice improved from prior EYES: conjunctiva are pink and non-injected, sclera clear LUNGS: clear to auscultation and percussion with normal breathing effort HEART: regular rate & rhythm and no murmurs and no lower extremity edema PSYCH: alert & oriented x 3, fluent speech NEURO: no focal motor/sensory deficits  LABORATORY DATA:  I have reviewed the data as listed CBC Latest Ref Rng & Units 04/24/2021 04/09/2021 04/01/2021  WBC 4.0 - 10.5 K/uL 10.4 12.3(H) 13.1(H)  Hemoglobin 13.0 - 17.0 g/dL 11.8(L) 12.6(L) 11.8(L)  Hematocrit 39.0 - 52.0 % 34.9(L) 36.8(L) 34.3(L)  Platelets 150 - 400 K/uL 250 322 347    CMP Latest Ref Rng & Units 04/24/2021 04/10/2021 04/09/2021  Glucose 70 - 99  mg/dL 215(H) 390(H) 666(HH)  BUN 6 - 20 mg/dL '14 12 8  '$ Creatinine 0.61 - 1.24 mg/dL 0.67 0.61 0.93  Sodium 135 - 145 mmol/L 137 127(L) 129(L)  Potassium 3.5 - 5.1 mmol/L 4.2 5.0 5.5(H)  Chloride 98 - 111 mmol/L 104 92(L) 96(L)  CO2 22 - 32 mmol/L '26 28 25  '$ Calcium 8.9 - 10.3 mg/dL 9.4 9.0 9.0  Total Protein 6.5 - 8.1 g/dL 7.6 7.1 8.0  Total Bilirubin 0.3 - 1.2 mg/dL 1.6(H) 3.3(H) 3.4(H)  Alkaline Phos 38 - 126 U/L 146(H) 246(H) 288(H)  AST 15 - 41 U/L '24 21 27  '$ ALT 0 - 44 U/L '13 12 15    '$ RADIOGRAPHIC STUDIES: I have personally reviewed the radiological images as listed and agreed with the findings in the report: Borderline resectable pancreatic mass with no evidence of metastatic disease. 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 EXAM: ERCP TECHNIQUE: Multiple spot images obtained with the fluoroscopic device and submitted for interpretation post-procedure. FLUOROSCOPY TIME:  Fluoroscopy Time:  40 seconds Radiation Exposure Index (if provided by the fluoroscopic device): Not reported Number of Acquired Spot Images: 7 COMPARISON:  CT and pelvis, 02/26/2021. FINDINGS: Multiple AP plain spot radiographs of the epigastrium performed by C-arm during ERCP. Images demonstrated a endoscopy, common bile duct cannulation, retrograde cholangiogram, plastic stent removal and and biliary stent placement. No discrete filling defect is appreciated IMPRESSION: Intra procedure fluoroscopic imaging for ERCP. These images were submitted for radiologic interpretation only. Please see the procedural report for the amount of contrast and the fluoroscopy time utilized. Electronically Signed   By: Michaelle Birks MD   On: 03/27/2021 14:57    ASSESSMENT & PLAN [name] [name] 60 y.o. male with medical history significant for borderline resectable pancreatic cancer presents for a follow up visit.   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  The patient does still have an elevation in bilirubin which likely represents a decline from the time of the latest stent placement.  He was evaluated by surgery who determined this is a borderline resectable tumor.  We will plan to start the patient on neoadjuvant chemotherapy.  The 2 options would be Gem/Abraxane or FOLFIRINOX.  Given his excellent functional status I do believe he would be a good candidate for FOLFIRINOX.  We will plan to have the patient start tomorrow on 04/10/2021.   # Adenocarcinoma of the Pancreas, Stage IIB -- At this time disease appears borderline resectable.  We will proceed with neoadjuvant chemotherapy.  At this time would prefer a  regimen of FOLFIRINOX given his good baseline health. -- case reviewed by surgery, who agrees his disease is borderline resectable.  -- baseline elevations in both CA 19-9 and CEA. Continue to monitor during treatment.  --Staging scans complete.  No need for further imaging at this time. Will plan for repeat imaging in approximately 8 cycles to reassess -- Cycle 1 Day 1 of FOLFIRINOX started 04/10/2021 Plan:  --today is Cycle 2 Day 1 of FOLFIRINOX -- RTC in 2 weeks for Cycle 3 of treatment   # Pain Control -- Patient currently taking ibuprofen for his abdominal pain --continue oxycodone 5 to 10 mg every 6 hours as needed   #Supportive Care -- chemotherapy education complete -- port placed -- zofran '8mg'$  q8H PRN and  compazine '10mg'$  PO q6H for nausea --trial of Creon called into pharmacy to help with loose stools. -- EMLA cream for port  No orders of the defined types were placed in this encounter.  All questions were answered. The patient knows to call the clinic with any problems, questions or concerns.  A total of more than 30 minutes were spent on this encounter with face-to-face time and non-face-to-face time, including preparing to see the patient, ordering tests and/or medications, counseling the patient and coordination of care as outlined above.   Ledell Peoples, MD Department of Hematology/Oncology [facility] Phone: [phone] Pager: [phone] Email: [email or web address]  04/24/2021 12:05 PM

## 2021-04-24 NOTE — Progress Notes (Signed)
Met with patient at registration to provide GFE(Good Faith Estimate) for uninsured. Patient states he has a hearing on Tuesday regarding possible Medicaid. Per [facility] notes, he was denied CFA app pending determination.  He has my card for any additional financial questions or concerns.   

## 2021-04-24 NOTE — Patient Instructions (Signed)
[facility]  Discharge Instructions: Thank you for choosing [facility] to provide your oncology and hematology care.   If you have a lab appointment with the [facility], please go directly to the [facility] and check in at the registration area.   Wear comfortable clothing and clothing appropriate for easy access to any Portacath or PICC line.   We strive to give you quality time with your provider. You may need to reschedule your appointment if you arrive late (15 or more minutes).  Arriving late affects you and other patients whose appointments are after yours.  Also, if you miss three or more appointments without notifying the office, you may be dismissed from the clinic at the provider's discretion.      For prescription refill requests, have your pharmacy contact our office and allow 72 hours for refills to be completed.    Today you received the following chemotherapy and/or immunotherapy agents Oxaliplatin, leucovorin, irinotecan, 5 FU.      To help prevent nausea and vomiting after your treatment, we encourage you to take your nausea medication as directed.  BELOW ARE SYMPTOMS THAT SHOULD BE REPORTED IMMEDIATELY: *FEVER GREATER THAN 100.4 F (38 C) OR HIGHER *CHILLS OR SWEATING *NAUSEA AND VOMITING THAT IS NOT CONTROLLED WITH YOUR NAUSEA MEDICATION *UNUSUAL SHORTNESS OF BREATH *UNUSUAL BRUISING OR BLEEDING *URINARY PROBLEMS (pain or burning when urinating, or frequent urination) *BOWEL PROBLEMS (unusual diarrhea, constipation, pain near the anus) TENDERNESS IN MOUTH AND THROAT WITH OR WITHOUT PRESENCE OF ULCERS (sore throat, sores in mouth, or a toothache) UNUSUAL RASH, SWELLING OR PAIN  UNUSUAL VAGINAL DISCHARGE OR ITCHING   Items with * indicate a potential emergency and should be followed up as soon as possible or go to the Emergency Department if any problems should occur.  Please show the CHEMOTHERAPY ALERT CARD or  IMMUNOTHERAPY ALERT CARD at check-in to the Emergency Department and triage nurse.  Should you have questions after your visit or need to cancel or reschedule your appointment, please contact [facility]  Dept: [phone]  and follow the prompts.  Office hours are 8:00 a.m. to 4:30 p.m. Monday - Friday. Please note that voicemails left after 4:00 p.m. may not be returned until the following business day.  We are closed weekends and major holidays. You have access to a nurse at all times for urgent questions. Please call the main number to the clinic Dept: [phone] and follow the prompts.   For any non-urgent questions, you may also contact your provider using MyChart. We now offer e-Visits for anyone 72 and older to request care online for non-urgent symptoms. For details visit [email or web address].   Also download the MyChart app! Go to the app store, search "MyChart", open the app, select [facility], and log in with your MyChart username and password.  Due to Covid, a mask is required upon entering the hospital/clinic. If you do not have a mask, one will be given to you upon arrival. For doctor visits, patients may have 1 support person aged 7 or older with them. For treatment visits, patients cannot have anyone with them due to current Covid guidelines and our immunocompromised population.

## 2021-04-25 ENCOUNTER — Other Ambulatory Visit: Payer: Self-pay | Admitting: Hematology and Oncology

## 2021-04-25 ENCOUNTER — Encounter: Payer: Self-pay | Admitting: Nurse Practitioner

## 2021-04-25 ENCOUNTER — Encounter: Payer: Self-pay | Admitting: Hematology and Oncology

## 2021-04-25 ENCOUNTER — Other Ambulatory Visit (HOSPITAL_COMMUNITY): Payer: Self-pay

## 2021-04-25 DIAGNOSIS — C250 Malignant neoplasm of head of pancreas: Secondary | ICD-10-CM

## 2021-04-26 ENCOUNTER — Other Ambulatory Visit: Payer: Self-pay

## 2021-04-26 ENCOUNTER — Inpatient Hospital Stay: Payer: Medicaid Other

## 2021-04-26 ENCOUNTER — Encounter: Payer: Self-pay | Admitting: Hematology and Oncology

## 2021-04-26 VITALS — BP 144/77 | HR 86 | Temp 98.0°F | Resp 20

## 2021-04-26 DIAGNOSIS — Z95828 Presence of other vascular implants and grafts: Secondary | ICD-10-CM

## 2021-04-26 DIAGNOSIS — Z5111 Encounter for antineoplastic chemotherapy: Secondary | ICD-10-CM | POA: Diagnosis not present

## 2021-04-26 MED ORDER — HEPARIN SOD (PORK) LOCK FLUSH 100 UNIT/ML IV SOLN
500.0000 [IU] | Freq: Once | INTRAVENOUS | Status: AC
  Administered 2021-04-26: 500 [IU]

## 2021-04-26 MED ORDER — SODIUM CHLORIDE 0.9% FLUSH
10.0000 mL | Freq: Once | INTRAVENOUS | Status: AC
  Administered 2021-04-26: 10 mL

## 2021-04-28 ENCOUNTER — Other Ambulatory Visit: Payer: Self-pay

## 2021-04-28 ENCOUNTER — Encounter: Payer: Self-pay | Admitting: Hematology and Oncology

## 2021-04-28 ENCOUNTER — Other Ambulatory Visit: Payer: Self-pay | Admitting: Nurse Practitioner

## 2021-04-28 ENCOUNTER — Ambulatory Visit: Payer: Self-pay | Attending: Nurse Practitioner

## 2021-04-28 ENCOUNTER — Other Ambulatory Visit (HOSPITAL_COMMUNITY): Payer: Self-pay

## 2021-04-28 DIAGNOSIS — E785 Hyperlipidemia, unspecified: Secondary | ICD-10-CM

## 2021-04-28 DIAGNOSIS — E1140 Type 2 diabetes mellitus with diabetic neuropathy, unspecified: Secondary | ICD-10-CM

## 2021-04-28 DIAGNOSIS — Z794 Long term (current) use of insulin: Secondary | ICD-10-CM

## 2021-04-28 MED FILL — Oxycodone HCl Tab 5 MG: ORAL | 8 days supply | Qty: 60 | Fill #0 | Status: AC

## 2021-04-29 ENCOUNTER — Encounter: Payer: Self-pay | Admitting: Hematology and Oncology

## 2021-04-29 ENCOUNTER — Other Ambulatory Visit: Payer: Self-pay

## 2021-04-29 LAB — HEMOGLOBIN A1C
Est. average glucose Bld gHb Est-mCnc: 229 mg/dL
Hgb A1c MFr Bld: 9.6 % — ABNORMAL HIGH (ref 4.8–5.6)

## 2021-04-29 LAB — LIPID PANEL
Chol/HDL Ratio: 2.5 ratio (ref 0.0–5.0)
Cholesterol, Total: 174 mg/dL (ref 100–199)
HDL: 70 mg/dL (ref >39)
LDL Chol Calc (NIH): 89 mg/dL (ref 0–99)
Triglycerides: 78 mg/dL (ref 0–149)
VLDL Cholesterol Cal: 15 mg/dL (ref 5–40)

## 2021-04-30 ENCOUNTER — Other Ambulatory Visit: Payer: Self-pay

## 2021-05-02 ENCOUNTER — Other Ambulatory Visit: Payer: Self-pay

## 2021-05-02 ENCOUNTER — Other Ambulatory Visit: Payer: Self-pay | Admitting: Nurse Practitioner

## 2021-05-02 ENCOUNTER — Encounter: Payer: Self-pay | Admitting: Hematology and Oncology

## 2021-05-02 MED ORDER — INSULIN GLARGINE 100 UNIT/ML ~~LOC~~ SOLN
20.0000 [IU] | Freq: Two times a day (BID) | SUBCUTANEOUS | 2 refills | Status: DC
  Filled 2021-05-02: qty 10, 25d supply, fill #0

## 2021-05-02 NOTE — Telephone Encounter (Signed)
Requested medication (s) are due for refill today: No  Requested medication (s) are on the active medication list: Yes  Last refill:  03/28/21  10 ml with 11 refills  Future visit scheduled: Yes  Notes to clinic:  Refilled 03/28/21 by Dr. Tana Coast.    Requested Prescriptions  Pending Prescriptions Disp Refills   insulin glargine (LANTUS) 100 UNIT/ML injection 10 mL 11    Sig: Inject 0.2 mLs (20 Units total) into the skin 2 (two) times daily.     Endocrinology:  Diabetes - Insulins Failed - 05/02/2021 10:16 AM      Failed - HBA1C is between 0 and 7.9 and within 180 days    Hgb A1c MFr Bld  Date Value Ref Range Status  04/28/2021 9.6 (H) 4.8 - 5.6 % Final    Comment:             Prediabetes: 5.7 - 6.4          Diabetes: >6.4          Glycemic control for adults with diabetes: <7.0           Passed - Valid encounter within last 6 months    Recent Outpatient Visits           3 weeks ago Hospital discharge follow-up   [place], Vernia Buff, NP   3 months ago Loose stools   [place], Vernia Buff, NP   4 months ago Generalized abdominal pain   [place], Michelle P, NP   5 months ago Hospital discharge follow-up   [place] [name], NP   7 months ago Essential hypertension   [place], Vernia Buff, NP       Future Appointments             In 2 months [name], NP [facility]

## 2021-05-04 ENCOUNTER — Emergency Department (HOSPITAL_COMMUNITY): Payer: Medicaid Other

## 2021-05-04 ENCOUNTER — Encounter (HOSPITAL_COMMUNITY): Payer: Self-pay

## 2021-05-04 ENCOUNTER — Inpatient Hospital Stay (HOSPITAL_COMMUNITY): Payer: Medicaid Other

## 2021-05-04 ENCOUNTER — Inpatient Hospital Stay (HOSPITAL_COMMUNITY)
Admission: EM | Admit: 2021-05-04 | Discharge: 2021-05-08 | DRG: 435 | Disposition: A | Discharge status: Home / Self Care | Payer: Medicaid Other | Attending: Internal Medicine | Admitting: Internal Medicine

## 2021-05-04 ENCOUNTER — Other Ambulatory Visit: Payer: Self-pay

## 2021-05-04 DIAGNOSIS — E860 Dehydration: Secondary | ICD-10-CM | POA: Diagnosis present

## 2021-05-04 DIAGNOSIS — E118 Type 2 diabetes mellitus with unspecified complications: Secondary | ICD-10-CM | POA: Diagnosis not present

## 2021-05-04 DIAGNOSIS — E119 Type 2 diabetes mellitus without complications: Secondary | ICD-10-CM

## 2021-05-04 DIAGNOSIS — F17200 Nicotine dependence, unspecified, uncomplicated: Secondary | ICD-10-CM | POA: Diagnosis present

## 2021-05-04 DIAGNOSIS — E876 Hypokalemia: Secondary | ICD-10-CM | POA: Diagnosis present

## 2021-05-04 DIAGNOSIS — R109 Unspecified abdominal pain: Secondary | ICD-10-CM | POA: Diagnosis present

## 2021-05-04 DIAGNOSIS — R1012 Left upper quadrant pain: Secondary | ICD-10-CM | POA: Diagnosis not present

## 2021-05-04 DIAGNOSIS — K30 Functional dyspepsia: Secondary | ICD-10-CM | POA: Diagnosis present

## 2021-05-04 DIAGNOSIS — E1143 Type 2 diabetes mellitus with diabetic autonomic (poly)neuropathy: Secondary | ICD-10-CM | POA: Diagnosis present

## 2021-05-04 DIAGNOSIS — I10 Essential (primary) hypertension: Secondary | ICD-10-CM | POA: Diagnosis present

## 2021-05-04 DIAGNOSIS — T182XXA Foreign body in stomach, initial encounter: Secondary | ICD-10-CM | POA: Diagnosis not present

## 2021-05-04 DIAGNOSIS — Z978 Presence of other specified devices: Secondary | ICD-10-CM

## 2021-05-04 DIAGNOSIS — Z20822 Contact with and (suspected) exposure to covid-19: Secondary | ICD-10-CM | POA: Diagnosis present

## 2021-05-04 DIAGNOSIS — Z794 Long term (current) use of insulin: Secondary | ICD-10-CM

## 2021-05-04 DIAGNOSIS — C250 Malignant neoplasm of head of pancreas: Secondary | ICD-10-CM | POA: Diagnosis present

## 2021-05-04 DIAGNOSIS — K2100 Gastro-esophageal reflux disease with esophagitis, without bleeding: Secondary | ICD-10-CM | POA: Diagnosis present

## 2021-05-04 DIAGNOSIS — R140 Abdominal distension (gaseous): Secondary | ICD-10-CM

## 2021-05-04 DIAGNOSIS — R1013 Epigastric pain: Secondary | ICD-10-CM | POA: Diagnosis not present

## 2021-05-04 DIAGNOSIS — F32A Depression, unspecified: Secondary | ICD-10-CM | POA: Diagnosis present

## 2021-05-04 DIAGNOSIS — K7460 Unspecified cirrhosis of liver: Secondary | ICD-10-CM | POA: Diagnosis present

## 2021-05-04 DIAGNOSIS — T40605A Adverse effect of unspecified narcotics, initial encounter: Secondary | ICD-10-CM | POA: Diagnosis present

## 2021-05-04 DIAGNOSIS — F1010 Alcohol abuse, uncomplicated: Secondary | ICD-10-CM | POA: Diagnosis present

## 2021-05-04 DIAGNOSIS — K8689 Other specified diseases of pancreas: Secondary | ICD-10-CM | POA: Diagnosis not present

## 2021-05-04 DIAGNOSIS — K311 Adult hypertrophic pyloric stenosis: Secondary | ICD-10-CM | POA: Diagnosis not present

## 2021-05-04 DIAGNOSIS — Z8619 Personal history of other infectious and parasitic diseases: Secondary | ICD-10-CM | POA: Diagnosis not present

## 2021-05-04 DIAGNOSIS — E871 Hypo-osmolality and hyponatremia: Secondary | ICD-10-CM | POA: Diagnosis present

## 2021-05-04 DIAGNOSIS — K8681 Exocrine pancreatic insufficiency: Secondary | ICD-10-CM | POA: Diagnosis present

## 2021-05-04 DIAGNOSIS — D630 Anemia in neoplastic disease: Secondary | ICD-10-CM | POA: Diagnosis present

## 2021-05-04 DIAGNOSIS — E1165 Type 2 diabetes mellitus with hyperglycemia: Secondary | ICD-10-CM | POA: Diagnosis present

## 2021-05-04 DIAGNOSIS — Z823 Family history of stroke: Secondary | ICD-10-CM

## 2021-05-04 DIAGNOSIS — Z8249 Family history of ischemic heart disease and other diseases of the circulatory system: Secondary | ICD-10-CM

## 2021-05-04 DIAGNOSIS — R112 Nausea with vomiting, unspecified: Secondary | ICD-10-CM | POA: Diagnosis not present

## 2021-05-04 DIAGNOSIS — Z803 Family history of malignant neoplasm of breast: Secondary | ICD-10-CM

## 2021-05-04 DIAGNOSIS — Z79891 Long term (current) use of opiate analgesic: Secondary | ICD-10-CM

## 2021-05-04 DIAGNOSIS — K5903 Drug induced constipation: Secondary | ICD-10-CM | POA: Diagnosis present

## 2021-05-04 DIAGNOSIS — K831 Obstruction of bile duct: Secondary | ICD-10-CM | POA: Diagnosis present

## 2021-05-04 DIAGNOSIS — K3184 Gastroparesis: Secondary | ICD-10-CM

## 2021-05-04 DIAGNOSIS — Z79899 Other long term (current) drug therapy: Secondary | ICD-10-CM

## 2021-05-04 DIAGNOSIS — R1084 Generalized abdominal pain: Secondary | ICD-10-CM

## 2021-05-04 DIAGNOSIS — F17210 Nicotine dependence, cigarettes, uncomplicated: Secondary | ICD-10-CM | POA: Diagnosis present

## 2021-05-04 DIAGNOSIS — Z800 Family history of malignant neoplasm of digestive organs: Secondary | ICD-10-CM

## 2021-05-04 DIAGNOSIS — K5901 Slow transit constipation: Secondary | ICD-10-CM | POA: Diagnosis not present

## 2021-05-04 DIAGNOSIS — Z833 Family history of diabetes mellitus: Secondary | ICD-10-CM

## 2021-05-04 LAB — CBC WITH DIFFERENTIAL/PLATELET
Abs Immature Granulocytes: 0.04 10*3/uL (ref 0.00–0.07)
Basophils Absolute: 0.1 10*3/uL (ref 0.0–0.1)
Basophils Relative: 1 %
Eosinophils Absolute: 0 10*3/uL (ref 0.0–0.5)
Eosinophils Relative: 0 %
HCT: 38.8 % — ABNORMAL LOW (ref 39.0–52.0)
Hemoglobin: 13.4 g/dL (ref 13.0–17.0)
Immature Granulocytes: 0 %
Lymphocytes Relative: 13 %
Lymphs Abs: 1.3 10*3/uL (ref 0.7–4.0)
MCH: 30.6 pg (ref 26.0–34.0)
MCHC: 34.5 g/dL (ref 30.0–36.0)
MCV: 88.6 fL (ref 80.0–100.0)
Monocytes Absolute: 1.6 10*3/uL — ABNORMAL HIGH (ref 0.1–1.0)
Monocytes Relative: 16 %
Neutro Abs: 7.1 10*3/uL (ref 1.7–7.7)
Neutrophils Relative %: 70 %
Platelets: 233 10*3/uL (ref 150–400)
RBC: 4.38 MIL/uL (ref 4.22–5.81)
RDW: 13.2 % (ref 11.5–15.5)
WBC: 10.2 10*3/uL (ref 4.0–10.5)
nRBC: 0 % (ref 0.0–0.2)

## 2021-05-04 LAB — COMPREHENSIVE METABOLIC PANEL
ALT: 15 U/L (ref 0–44)
AST: 21 U/L (ref 15–41)
Albumin: 3.6 g/dL (ref 3.5–5.0)
Alkaline Phosphatase: 102 U/L (ref 38–126)
Anion gap: 13 (ref 5–15)
BUN: 22 mg/dL — ABNORMAL HIGH (ref 6–20)
CO2: 28 mmol/L (ref 22–32)
Calcium: 9.2 mg/dL (ref 8.9–10.3)
Chloride: 85 mmol/L — ABNORMAL LOW (ref 98–111)
Creatinine, Ser: 0.61 mg/dL (ref 0.61–1.24)
GFR, Estimated: >60 mL/min (ref >60)
Glucose, Bld: 241 mg/dL — ABNORMAL HIGH (ref 70–99)
Potassium: 3 mmol/L — ABNORMAL LOW (ref 3.5–5.1)
Sodium: 126 mmol/L — ABNORMAL LOW (ref 135–145)
Total Bilirubin: 1.5 mg/dL — ABNORMAL HIGH (ref 0.3–1.2)
Total Protein: 8.2 g/dL — ABNORMAL HIGH (ref 6.5–8.1)

## 2021-05-04 LAB — URINALYSIS, ROUTINE W REFLEX MICROSCOPIC
Bacteria, UA: NONE SEEN
Glucose, UA: 250 mg/dL — AB
Hgb urine dipstick: NEGATIVE
Ketones, ur: 40 mg/dL — AB
Leukocytes,Ua: NEGATIVE
Nitrite: NEGATIVE
Protein, ur: 100 mg/dL — AB
Specific Gravity, Urine: >1.03 — ABNORMAL HIGH (ref 1.005–1.030)
pH: 6 (ref 5.0–8.0)

## 2021-05-04 LAB — MAGNESIUM: Magnesium: 1.7 mg/dL (ref 1.7–2.4)

## 2021-05-04 LAB — LACTIC ACID, PLASMA
Lactic Acid, Venous: 1.1 mmol/L (ref 0.5–1.9)
Lactic Acid, Venous: 1.1 mmol/L (ref 0.5–1.9)

## 2021-05-04 LAB — LIPASE, BLOOD: Lipase: 16 U/L (ref 11–51)

## 2021-05-04 MED ORDER — ACETAMINOPHEN 325 MG PO TABS
650.0000 mg | ORAL_TABLET | Freq: Once | ORAL | Status: AC
  Administered 2021-05-04: 650 mg via ORAL
  Filled 2021-05-04: qty 2

## 2021-05-04 MED ORDER — PANTOPRAZOLE SODIUM 40 MG IV SOLR
40.0000 mg | INTRAVENOUS | Status: DC
  Administered 2021-05-05 – 2021-05-08 (×4): 40 mg via INTRAVENOUS
  Filled 2021-05-04 (×4): qty 40

## 2021-05-04 MED ORDER — NALOXONE HCL 0.4 MG/ML IJ SOLN: 0.4000 mg | INTRAMUSCULAR | Status: DC | PRN

## 2021-05-04 MED ORDER — ONDANSETRON HCL 4 MG/2ML IJ SOLN
4.0000 mg | Freq: Once | INTRAMUSCULAR | Status: AC
Start: 2021-05-04 — End: 2021-05-04
  Administered 2021-05-04: 4 mg via INTRAVENOUS
  Filled 2021-05-04: qty 2

## 2021-05-04 MED ORDER — MORPHINE SULFATE (PF) 4 MG/ML IV SOLN
4.0000 mg | Freq: Once | INTRAVENOUS | Status: AC
  Administered 2021-05-04: 4 mg via INTRAVENOUS
  Filled 2021-05-04: qty 1

## 2021-05-04 MED ORDER — SODIUM CHLORIDE 0.9 % IV BOLUS
500.0000 mL | Freq: Once | INTRAVENOUS | Status: AC
  Administered 2021-05-04: 500 mL via INTRAVENOUS

## 2021-05-04 MED ORDER — PROCHLORPERAZINE EDISYLATE 10 MG/2ML IJ SOLN
10.0000 mg | Freq: Once | INTRAMUSCULAR | Status: AC
  Administered 2021-05-04: 10 mg via INTRAVENOUS
  Filled 2021-05-04: qty 2

## 2021-05-04 MED ORDER — HYDROMORPHONE HCL 1 MG/ML IJ SOLN
0.5000 mg | INTRAMUSCULAR | Status: DC | PRN
  Administered 2021-05-04 – 2021-05-07 (×10): 0.5 mg via INTRAVENOUS
  Filled 2021-05-04 (×10): qty 0.5

## 2021-05-04 MED ORDER — ONDANSETRON HCL 4 MG/2ML IJ SOLN
4.0000 mg | Freq: Four times a day (QID) | INTRAMUSCULAR | Status: DC | PRN
  Administered 2021-05-05: 4 mg via INTRAVENOUS
  Filled 2021-05-04: qty 2

## 2021-05-04 MED ORDER — INSULIN ASPART 100 UNIT/ML IJ SOLN
0.0000 [IU] | Freq: Four times a day (QID) | INTRAMUSCULAR | Status: DC
  Administered 2021-05-04 – 2021-05-05 (×2): 2 [IU] via SUBCUTANEOUS
  Administered 2021-05-05 – 2021-05-06 (×3): 1 [IU] via SUBCUTANEOUS
  Administered 2021-05-07 (×3): 2 [IU] via SUBCUTANEOUS
  Administered 2021-05-07 – 2021-05-08 (×2): 1 [IU] via SUBCUTANEOUS
  Administered 2021-05-08: 5 [IU] via SUBCUTANEOUS

## 2021-05-04 MED ORDER — ACETAMINOPHEN 650 MG RE SUPP: 650.0000 mg | Freq: Four times a day (QID) | RECTAL | Status: DC | PRN

## 2021-05-04 MED ORDER — PANTOPRAZOLE SODIUM 40 MG IV SOLR
40.0000 mg | Freq: Once | INTRAVENOUS | Status: AC
  Administered 2021-05-04: 40 mg via INTRAVENOUS

## 2021-05-04 MED ORDER — ACETAMINOPHEN 325 MG PO TABS: 650.0000 mg | ORAL_TABLET | Freq: Four times a day (QID) | ORAL | Status: DC | PRN

## 2021-05-04 MED ORDER — IOHEXOL 350 MG/ML SOLN
80.0000 mL | Freq: Once | INTRAVENOUS | Status: AC | PRN
  Administered 2021-05-04: 80 mL via INTRAVENOUS

## 2021-05-04 MED ORDER — LORAZEPAM 2 MG/ML IJ SOLN
0.5000 mg | Freq: Four times a day (QID) | INTRAMUSCULAR | Status: DC | PRN
  Filled 2021-05-04: qty 1

## 2021-05-04 MED ORDER — POTASSIUM CHLORIDE 10 MEQ/100ML IV SOLN
10.0000 meq | INTRAVENOUS | Status: AC
Start: 2021-05-04 — End: 2021-05-05
  Administered 2021-05-05 (×4): 10 meq via INTRAVENOUS
  Filled 2021-05-04: qty 100

## 2021-05-04 MED ORDER — LACTATED RINGERS IV SOLN: INTRAVENOUS | Status: DC

## 2021-05-04 MED ORDER — INSULIN GLARGINE-YFGN 100 UNIT/ML ~~LOC~~ SOLN
7.0000 [IU] | Freq: Two times a day (BID) | SUBCUTANEOUS | Status: DC
  Administered 2021-05-04 – 2021-05-08 (×8): 7 [IU] via SUBCUTANEOUS
  Filled 2021-05-04 (×8): qty 0.07

## 2021-05-04 MED ORDER — POTASSIUM CHLORIDE 10 MEQ/100ML IV SOLN
10.0000 meq | Freq: Once | INTRAVENOUS | Status: AC
  Administered 2021-05-04: 10 meq via INTRAVENOUS
  Filled 2021-05-04: qty 100

## 2021-05-04 NOTE — ED Provider Notes (Signed)
[facility] DEPT Provider Note   CSN: [id number] Arrival date & time: 05/04/21  1605     History Chief Complaint  Patient presents with   Abdominal Pain   Fever    [name] [name] is a 60 y.o. male with history of pancreatic cancer currently on chemotherapy who presents to the emergency department today for abdominal pain that began 6 days ago.  He had his second cycle of chemotherapy earlier in the week.  He states he had a large and greasy breakfast and felt very bloated afterwards.  The abdominal pain started shortly afterwards.  He complains of constant sharp abdominal pain localized to the left lower quadrant.  It does not radiate anywhere.  He reports associated nausea, vomiting, and subjective fever but denies urinary complaints, sore throat, cough, chest pain, shortness of breath, leg pain, and leg swelling.  He states it does feel better when he vomits for some time.  He was prescribed oxycodone for pain related to his pancreatic cancer which he took in it did not help his pain.  He does endorse some constipation this week which is abnormal for him.  He states prior to his pancreatic diagnosis he was having frequent diarrhea which was his baseline at that time. His last colonoscopy was 3 years which revealed many diverticula and a few polyps which were removed.    The history is provided by the patient. No language interpreter was used.  Abdominal Pain Associated symptoms: fever   Associated symptoms: no dysuria and no hematuria   Fever Associated symptoms: no dysuria       Past Medical History:  Diagnosis Date   Barrett's esophagus dx 2016   Bronchitis    Chronic hepatitis C without hepatic coma ([place]) 10/31/2014   Depression    Diabetes ([place])    ED (erectile dysfunction)    GERD (gastroesophageal reflux disease)    Hepatitis C    Hiatal hernia 10/2014   3cm   Neuropathy    S/P transmetatarsal amputation of foot, left ([place]) 11/28/2020     Patient Active Problem List   Diagnosis Date Noted   Nausea & vomiting 05/04/2021   Port-A-Cath in place 04/24/2021   Malnutrition of moderate degree 03/27/2021   Hyperglycemia due to diabetes mellitus ([place]) 03/26/2021   Pancreatic cancer ([place]) 03/12/2021   Malignant neoplasm of head of pancreas ([place]) 03/12/2021   Jaundice 02/26/2021   Loss of weight 02/26/2021   S/P transmetatarsal amputation of foot, left ([place]) 11/28/2020   Hypertension associated with type 2 diabetes mellitus ([place]) 11/28/2020   Type 2 diabetes mellitus with diabetic neuropathy, unspecified ([place]) 11/28/2020   Hyperlipidemia associated with type 2 diabetes mellitus ([place]) 11/28/2020   GERD without esophagitis 11/28/2020   Major depression, recurrent, chronic ([place]) 11/28/2020   Aortic atherosclerosis ([place]) 11/28/2020   Diabetic neuropathy ([place]) 11/28/2020   Osteomyelitis ([place]) 11/14/2020   Sepsis ([place]) 10/28/2020   Community acquired pneumonia of right lower lobe of lung    Elevated LFTs    Hypophosphatemia    Acute metabolic encephalopathy    Acute maxillary sinusitis    Depression    Physical deconditioning    Pneumonia 05/14/2020   DTs (delirium tremens) ([place]) 05/13/2020   Hyponatremia 05/13/2020   Hypokalemia 05/13/2020   SIRS (systemic inflammatory response syndrome) ([place]) 05/13/2020   Sepsis with acute hypoxic respiratory failure without septic shock ([place])    Alcohol abuse with alcohol-induced mood disorder ([place]) 12/17/2017   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   Essential hypertension 04/02/2017   Hypogonadism male 08/13/2016   ETOH abuse 06/22/2016   Low serum testosterone level 01/08/2016   Type 2 diabetes mellitus with other specified complication ([place]) [id number]   Erectile dysfunction 09/12/2015   Hepatic cirrhosis ([place]) 01/10/2015   Chronic hepatitis C without hepatic coma ([place]) 10/31/2014   Tobacco use disorder 08/03/2013    Diabetes type 2, controlled ([place]) 04/27/2013    Past Surgical History:  Procedure Laterality Date   AMPUTATION Left 11/15/2020   Procedure: LEFT TRANSMETATARSALS AMPUTATION;  Surgeon: Newt Minion, MD;  Location: [facility];  Service: Orthopedics;  Laterality: Left;   APPENDECTOMY     BILIARY STENT PLACEMENT N/A 02/27/2021   Procedure: BILIARY STENT PLACEMENT;  Surgeon: Milus Banister, MD;  Location: [facility];  Service: Endoscopy;  Laterality: N/A;   BILIARY STENT PLACEMENT N/A 03/27/2021   Procedure: BILIARY STENT PLACEMENT;  Surgeon: Jackquline Denmark, MD;  [facility];  Service: Endoscopy;  Laterality: N/A;   BIOPSY  03/27/2021   Procedure: BIOPSY;  Surgeon: Jackquline Denmark, MD;  [facility];  Service: Endoscopy;;   COLONOSCOPY N/A 02/16/2014   Procedure: COLONOSCOPY;  Surgeon: Gatha Mayer, MD;  [facility];  Service: Endoscopy;  Laterality: N/A;   COLONOSCOPY     ENDOSCOPIC RETROGRADE CHOLANGIOPANCREATOGRAPHY (ERCP) WITH PROPOFOL N/A 02/27/2021   Procedure: ENDOSCOPIC RETROGRADE CHOLANGIOPANCREATOGRAPHY (ERCP) WITH PROPOFOL;  Surgeon: Milus Banister, MD;  [facility];  Service: Endoscopy;  Laterality: N/A;   ERCP N/A 03/27/2021   Procedure: ENDOSCOPIC RETROGRADE CHOLANGIOPANCREATOGRAPHY (ERCP);  Surgeon: Jackquline Denmark, MD;  [facility];  Service: Endoscopy;  Laterality: N/A;   EUS N/A 02/27/2021   Procedure: UPPER ENDOSCOPIC ULTRASOUND (EUS) RADIAL;  Surgeon: Milus Banister, MD;  [facility];  Service: Endoscopy;  Laterality: N/A;   FINE NEEDLE ASPIRATION N/A 02/27/2021   Procedure: FINE NEEDLE ASPIRATION (FNA) LINEAR;  Surgeon: Milus Banister, MD;  [facility];  Service: Endoscopy;  Laterality: N/A;   IR IMAGING GUIDED PORT INSERTION  03/21/2021   SPHINCTEROTOMY  02/27/2021   Procedure: SPHINCTEROTOMY;  Surgeon: Milus Banister, MD;  [facility];  Service: Endoscopy;;   STENT REMOVAL  03/27/2021    Procedure: STENT REMOVAL;  Surgeon: Jackquline Denmark, MD;  [facility];  Service: Endoscopy;;   TONSILLECTOMY         Family History  Problem Relation Age of Onset   Breast cancer Mother    Stroke Father    Alcohol abuse Father    Heart disease Maternal Grandfather    Pancreatic cancer Paternal Grandmother    Diabetes Paternal Grandfather    Colon cancer Neg Hx    Stomach cancer Neg Hx    Rectal cancer Neg Hx    Esophageal cancer Neg Hx     Social History   Tobacco Use   Smoking status: Every Day    Packs/day: 0.50    Years: 35.00    Pack years: 17.50    Types: Cigarettes   Smokeless tobacco: Never  Vaping Use   Vaping Use: Never used  Substance Use Topics   Alcohol use: Not Currently    Comment: previous   Drug use: No    Home Medications Prior to Admission medications   Medication Sig Start Date End Date Taking? Authorizing Provider  albuterol (VENTOLIN HFA) 108 (90 Base) MCG/ACT inhaler Inhale 2 puffs into the lungs every 6 (six) hours as needed for  wheezing or shortness of breath. 12/05/20  Yes Gerlene Fee, NP  atorvastatin (LIPITOR) 40 MG tablet Take 40 mg by mouth daily.   Yes [provider]  diphenoxylate-atropine (LOMOTIL) 2.5-0.025 MG tablet Take 1 tablet by mouth 4 (four) times daily as needed for diarrhea or loose stools. 04/07/21  Yes [name], NP  DULoxetine (CYMBALTA) 60 MG capsule Take 1 capsule (60 mg total) by mouth daily. 04/04/21 07/03/21 Yes Newlin, Charlane Ferretti, MD  EPINEPHrine 0.3 mg/0.3 mL IJ SOAJ injection Inject 0.3 mg into the muscle as needed for anaphylaxis. 06/11/20  Yes [provider]  folic acid (FOLVITE) 1 MG tablet Take 1 tablet (1 mg total) by mouth in the morning. 03/28/21  Yes Rai, Ripudeep K, MD  insulin glargine (LANTUS) 100 UNIT/ML injection Inject 0.2 mLs (20 Units total) into the skin 2 (two) times daily. Patient taking differently: Inject 20-25 Units into the skin as directed. Inject 25 units in  the morning and Inject 20 units at bedtime 05/02/21  Yes Newlin, Enobong, MD  insulin lispro (HUMALOG) 200 UNIT/ML KwikPen Inject 7 Units into the skin 3 (three) times daily before meals. Patient taking differently: Inject 0-9 Units into the skin 3 (three) times daily before meals. Sliding scale CBG 70 - 120: 0 units CBG 121 - 150: 1 unit,  CBG 151 - 200: 2 units,  CBG 201 - 250: 3 units,  CBG 251 - 300: 5 units,  CBG 301 - 350: 7 units,  CBG 351 - 400: 9 units   CBG > 400: 9 units and notify your MD 03/28/21  Yes Rai, Ripudeep K, MD  lidocaine-prilocaine (EMLA) cream Apply 1 application topically as directed as needed. Patient taking differently: Apply 1 application topically as needed (access port). 03/12/21  Yes Orson Slick, MD  lipase/protease/amylase (CREON) 36000 UNITS CPEP capsule Take 2 capsules (72,000 Units total) by mouth 3 (three) times daily with meals. May also take 1 capsule (36,000 Units total) as needed (with snacks). NEEDS PASS. 04/07/21  Yes [name], NP  losartan (COZAAR) 50 MG tablet Take 1 tablet (50 mg total) by mouth daily. 03/28/21 03/28/22 Yes Rai, Ripudeep K, MD  magic mouthwash w/lidocaine SOLN Take 5 mLs by mouth 4 (four) times daily as needed. Patient taking differently: Take 5 mLs by mouth 4 (four) times daily as needed for mouth pain. 04/16/21  Yes Dede Query T, PA-C  mirtazapine (REMERON) 30 MG tablet Take 1 tablet (30 mg total) by mouth at bedtime. 01/07/21 06/06/21 Yes [name], NP  Multiple Vitamins-Minerals (THEREMS-M) TABS Take 1 tablet by mouth in the morning.   Yes [provider]  omeprazole (PRILOSEC) 40 MG capsule Take 1 capsule (40 mg total) by mouth in the morning. 03/28/21 03/28/22 Yes Rai, Ripudeep K, MD  ondansetron (ZOFRAN) 8 MG tablet Take 1 tablet (8 mg total) by mouth 2 (two) times daily as needed. Start on day 3 after chemotherapy. Patient taking differently: Take 8 mg by mouth 2 (two) times daily as needed for nausea or vomiting.  Start on day 3 after chemotherapy. 04/10/21  Yes Orson Slick, MD  oxyCODONE (OXY IR/ROXICODONE) 5 MG immediate release tablet Take 1 to 2 tablets by mouth every 6  hours as needed for severe pain. 04/28/21  Yes Orson Slick, MD  prochlorperazine (COMPAZINE) 10 MG tablet Take 1 tablet (10 mg total) by mouth every 6 (six) hours as needed (Nausea or vomiting). 04/10/21  Yes Lorenso Courier,  Madie Reno IV, MD  glucose blood (TRUE METRIX BLOOD GLUCOSE TEST) test strip Use as instructed. Check blood glucose level by fingerstick 3-4 times per day.  E11.65 03/04/21   [name], NP  insulin lispro (HUMALOG) 100 UNIT/ML KwikPen Inject 0-9 Units into the skin 3 (three) times daily. Sliding scale CBG 70 - 120: 0 units CBG 121 - 150: 1 unit,  CBG 151 - 200: 2 units,  CBG 201 - 250: 3 units,  CBG 251 - 300: 5 units,  CBG 301 - 350: 7 units,  CBG 351 - 400: 9 units   CBG > 400: 9 units and notify your MD 03/28/21   Rai, Vernelle Emerald, MD  Insulin Pen Needle (TECHLITE PEN NEEDLES) 32G X 4 MM MISC USE AS INSTRUCTED INTO THE SKIN TWICE DAILY 03/04/21   [name], NP  Insulin Pen Needle (TRUEPLUS PEN NEEDLES) 32G X 4 MM MISC Use as instructed. Inject into the skin twice daily 03/04/21   [name], NP  lidocaine-diphenhydrAMINE-alum & mag hydroxide-simeth-distilled water in nystatin suspension Swish/gargle and spit/swallow 5 ml by mouth 4 times a day as needed. Patient not taking: Reported on 05/04/2021 04/16/21   Dede Query T, PA-C  lidocaine-prilocaine (EMLA) cream Apply to affected area once Patient not taking: Reported on 05/04/2021 04/10/21   Orson Slick, MD  prochlorperazine (COMPAZINE) 10 MG tablet Take 1 tablet (10 mg total) by mouth every 6 (six) hours as needed for nausea or vomiting. Patient not taking: Reported on 05/04/2021 03/28/21   Mendel Corning, MD  TRUEplus Lancets 28G MISC Use as instructed. Check blood glucose level by fingerstick 3-4 times per day.   E11.65 03/04/21   [name], NP     Allergies    Bee venom and Lactose intolerance (gi)  Review of Systems   Review of Systems  Constitutional:  Positive for fever.  Gastrointestinal:  Positive for abdominal pain.  Genitourinary:  Negative for difficulty urinating, dysuria and hematuria.  All other systems reviewed and are negative.  Physical Exam Updated Vital Signs BP (!) 151/80 (BP Location: Right Arm)   Pulse 89   Temp 99.7 F (37.6 C) (Oral)   Resp 16   Wt 67.8 kg   SpO2 100%   BMI 20.85 kg/m   Physical Exam Constitutional:      General: He is not in acute distress.    Appearance: Normal appearance.  HENT:     Head: Normocephalic and atraumatic.  Eyes:     General:        Right eye: No discharge.        Left eye: No discharge.  Cardiovascular:     Comments: Regular rate and rhythm.  S1/S2 are distinct without any evidence of murmur, rubs, or gallops.  Radial pulses are 2+ bilaterally.  Dorsalis pedis pulses are 2+ bilaterally.  No evidence of pedal edema. Pulmonary:     Comments: Clear to auscultation bilaterally.  Normal effort.  No respiratory distress.  No evidence of wheezes, rales, or rhonchi heard throughout. Abdominal:     General: Abdomen is flat. Bowel sounds are normal. There is no distension.     Tenderness: There is no guarding or rebound.     Comments: There is moderate left lower quadrant tenderness.  There is pain in the left lower quadrant when palpating the right lower quadrant and suprapubic regions.  No peritoneal signs at this time.  His abdomen is soft and nondistended.  He  had good bowel sounds in all 4 quadrants.  Musculoskeletal:        General: Normal range of motion.     Cervical back: Neck supple.  Skin:    General: Skin is warm and dry.     Findings: No rash.  Neurological:     General: No focal deficit present.     Mental Status: He is alert.  Psychiatric:        Mood and Affect: Mood normal.        Behavior: Behavior normal.    ED Results / Procedures /  Treatments   Labs (all labs ordered are listed, but only abnormal results are displayed) Labs Reviewed  COMPREHENSIVE METABOLIC PANEL - Abnormal; Notable for the following components:      Result Value   Sodium 126 (*)    Potassium 3.0 (*)    Chloride 85 (*)    Glucose, Bld 241 (*)    BUN 22 (*)    Total Protein 8.2 (*)    Total Bilirubin 1.5 (*)    All other components within normal limits  CBC WITH DIFFERENTIAL/PLATELET - Abnormal; Notable for the following components:   HCT 38.8 (*)    Monocytes Absolute 1.6 (*)    All other components within normal limits  URINALYSIS, ROUTINE W REFLEX MICROSCOPIC - Abnormal; Notable for the following components:   Color, Urine YELLOW (*)    APPearance CLEAR (*)    Specific Gravity, Urine >1.030 (*)    Glucose, UA 250 (*)    Bilirubin Urine MODERATE (*)    Ketones, ur 40 (*)    Protein, ur 100 (*)    All other components within normal limits  CULTURE, BLOOD (ROUTINE X 2)  CULTURE, BLOOD (ROUTINE X 2)  RESP PANEL BY RT-PCR (FLU A&B, COVID) ARPGX2  LIPASE, BLOOD  LACTIC ACID, PLASMA  LACTIC ACID, PLASMA  MAGNESIUM  PHOSPHORUS  MAGNESIUM  COMPREHENSIVE METABOLIC PANEL  CBC  SODIUM, URINE, RANDOM  OSMOLALITY, URINE  OSMOLALITY  TSH    EKG None  Radiology CT Abdomen Pelvis W Contrast  Result Date: 05/04/2021 CLINICAL DATA:  Diverticulitis suspected abdominal pain Patient reports left lower quadrant pain. Fever. Nausea and vomiting. History of pancreatic cancer, active chemotherapy. EXAM: CT ABDOMEN AND PELVIS WITH CONTRAST TECHNIQUE: Multidetector CT imaging of the abdomen and pelvis was performed using the standard protocol following bolus administration of intravenous contrast. CONTRAST:  108m OMNIPAQUE IOHEXOL 350 MG/ML SOLN COMPARISON:  Most recent CT noncontrast exam 02/26/2021. FINDINGS: Lower chest: Hypoventilatory atelectasis in the right lower lobe. No pulmonary nodule or pleural effusion. Fluid-filled dilated distal esophagus.  Normal heart size with coronary artery calcifications. Hepatobiliary: Pneumobilia likely related to intervening ERCP. Common bile duct stent is in place. No intrahepatic biliary ductal dilatation. Decreased gallbladder distension from prior exam. Equivocal gallbladder wall thickening but no pericholecystic fat stranding. Suspected tiny gallstones. Pancreas: Pancreatic head mass is difficult to delineate on the current exam, however appears decreased in size from prior currently measuring approximately 2.8 x 2.9 cm, series 2, image 40. There is distal pancreatic atrophy and ductal dilatation with pancreatic duct measuring 4 mm. No definite peripancreatic fat stranding or inflammation. Spleen: Normal in size without focal abnormality. Adrenals/Urinary Tract: No adrenal nodule. No hydronephrosis or perinephric edema. Homogeneous renal enhancement with symmetric excretion on delayed phase imaging. Tiny hypodensity in the mid lower right kidney is too small to characterize. Urinary bladder is physiologically distended without wall thickening. Stomach/Bowel: Fluid distends the distal esophagus which  is mildly dilated. The stomach is markedly dilated and fluid-filled. There may be mass effect on the gastroduodenal junction, series 2, image 38, from pancreatic mass, difficult to accurately define bowel in this region due to lack of enteric contrast. Duodenum is difficult to define, however there is fluid within the duodenum which is nondistended. Few fluid-filled small bowel loops without abnormal small bowel distension or inflammation. Appendix not visualized, appendectomy per history. Moderate volume of colonic stool. There is no colonic wall thickening or inflammation. Diverticulosis from the splenic flexure distally. No diverticulitis. Vascular/Lymphatic: Moderate aortic and branch atherosclerosis. Portal caval node measuring 1.2 x 2.3 cm, not significantly changed in size allowing for differences in caliper placement.  Periportal node has likely increased from prior, 15 mm short axis series 2, image 26. There are multiple additional prominent nodes in the porta hepatis. No portal vein thrombus. The superior mesenteric vein is attenuated. The splenic vein is attenuated. No evidence of thrombus or occlusion. There are some left upper quadrant collaterals. Reproductive: Prostatic calcifications. Other: No ascites.  No free air.  No omental thickening. Musculoskeletal: Degenerative change in the lumbar spine with primarily facet hypertrophy. No focal bone lesion or acute osseous abnormalities are seen IMPRESSION: 1. Markedly dilated fluid-filled stomach, with fluid distending the distal esophagus. This may be due to mass effect from pancreatic mass, however the soft tissue planes adjacent to the distal stomach are not well-defined on this current exam in the absence enteric contrast. 2. Common bile duct stent in place with pneumobilia. Decreased gallbladder distension from prior exam. Equivocal gallbladder wall thickening but no pericholecystic fat stranding. 3. Known pancreatic head mass is difficult to delineate, however appears decreased in size from prior exam. Periportal adenopathy, with slight increase size of some of the periportal nodes. 4. Colonic diverticulosis without diverticulitis. 5. Attenuated superior mesenteric and splenic veins without thrombus or occlusion. There are left upper quadrant collaterals. Aortic Atherosclerosis (ICD10-I70.0). Electronically Signed   By: Keith Rake M.D.   On: 05/04/2021 20:17    Procedures Procedures   Medications Ordered in ED Medications  acetaminophen (TYLENOL) tablet 650 mg (has no administration in time range)    Or  acetaminophen (TYLENOL) suppository 650 mg (has no administration in time range)  potassium chloride 10 mEq in 100 mL IVPB (10 mEq Intravenous New Bag/Given 05/04/21 2251)  naloxone (NARCAN) injection 0.4 mg (has no administration in time range)   HYDROmorphone (DILAUDID) injection 0.5 mg (has no administration in time range)  ondansetron (ZOFRAN) injection 4 mg (has no administration in time range)  LORazepam (ATIVAN) injection 0.5 mg (has no administration in time range)  lactated ringers infusion ( Intravenous New Bag/Given 05/04/21 2322)  potassium chloride 10 mEq in 100 mL IVPB (has no administration in time range)  insulin glargine-yfgn (SEMGLEE) injection 7 Units (has no administration in time range)  insulin aspart (novoLOG) injection 0-9 Units (has no administration in time range)  pantoprazole (PROTONIX) injection 40 mg (has no administration in time range)  morphine 4 MG/ML injection 4 mg (4 mg Intravenous Given 05/04/21 1752)  ondansetron (ZOFRAN) injection 4 mg (4 mg Intravenous Given 05/04/21 1750)  acetaminophen (TYLENOL) tablet 650 mg (650 mg Oral Given 05/04/21 1926)  prochlorperazine (COMPAZINE) injection 10 mg (10 mg Intravenous Given 05/04/21 1935)  sodium chloride 0.9 % bolus 500 mL (0 mLs Intravenous Stopped 05/04/21 2052)  iohexol (OMNIPAQUE) 350 MG/ML injection 80 mL (80 mLs Intravenous Contrast Given 05/04/21 1940)  morphine 4 MG/ML injection 4 mg (4 mg Intravenous  Given 05/04/21 2020)  sodium chloride 0.9 % bolus 500 mL (0 mLs Intravenous Stopped 05/04/21 2203)  pantoprazole (PROTONIX) injection 40 mg (40 mg Intravenous Given 05/04/21 2259)    ED Course  I have reviewed the triage vital signs and the nursing notes.  Pertinent labs & imaging results that were available during my care of the patient were reviewed by me and considered in my medical decision making (see chart for details).  Clinical Course as of 05/04/21 2332  Nancy Fetter May 04, 2021  1756 Patient unable to get rectal temperatures per his oncology team. I wanted to get a better measure of his core body temperature given his history of pancreatic cancer and chemotherapy.  [CF]  2104 I personally reevaluated the patient and palpated his abdomen with him sitting up in the  bed.  There was moderate amount of distention in the upper abdomen which I did not feel on my initial presentation as the patient was lying supine.  Further questioning reveals that he has been experiencing some reflux recently. [CF]    Clinical Course User Index [CF] Cherrie Gauze   MDM Rules/Calculators/A&P                          [name] [name] is a 60 y.o. male with history of pancreatic cancer who presents to the emergency department for evaluation of abdominal pain.  Abdominal exam is without peritoneal signs.  He is significantly distended in the upper abdomen when he is sitting upright.  I have a low suspicion for acute hepatobiliary disease including cholecystitis and cholangitis.  I doubt appendicitis and testicular torsion. I am concerned for a complication of his pancreatic mass. Initially complained of LLQ abdominal pain.   CMP revealed several electrolyte derangements including hyponatremia, hypokalemia, hypochloremia.  Bilirubin was slightly elevated but improved from previous.  CBC was without leukocytosis.  Lipase and lactic acid were within normal limits.  Urinalysis did not reveal any overlying infection.  CT abdomen revealed markedly dilated stomach with fluid distending the distal esophagus. I have low suspicion for diverticulitis.   Given the clinical scenario believe he would benefit from admission for further evaluation and work-up.  I spoke with gastroenterology for consultation and they said this is likely a gastric outlet obstruction and recommended putting in an NG tube and will evaluate the patient in the morning. Patient will be admitted to the hospitalist service.   Final Clinical Impression(s) / ED Diagnoses Final diagnoses:  Generalized abdominal pain    Rx / DC Orders ED Discharge Orders     None        Hendricks Limes, [place] 05/04/21 2332    Tegeler, Gwenyth Allegra, MD 05/05/21 [phone]

## 2021-05-04 NOTE — H&P (Signed)
History and Physical    PLEASE NOTE THAT DRAGON DICT[employer] SOFTWARE WAS USED IN THE CONSTRUCTION OF THIS NOTE.   [name] [name] [id number] DOB: August 07, 1961 DOA: 05/04/2021  PCP: [name], NP Patient coming from: home   I have personally briefly reviewed patient's old medical records in [place]  Chief Complaint: Abdominal pain  HPI: [name] [name] is a 60 y.o. male with medical history significant for pancreatic cancer undergoing chemotherapy, type 2 diabetes mellitus, hypertension, who is admitted to [facility] on 05/04/2021 with suspected gastric outlet obstruction after presenting from home to [facility] ED complaining of abdominal pain.   The patient reports 4 to 5 days of progressive crampy abdominal discomfort.  He notes the pain distribution to be diffuse, with most prominent associated intensity over the epigastrium.  While pain was initially noted to be intermittent, and has become constant over the last 2 to 3 days, and worsens with palpation over the abdomen.  He notes associated mild abdominal distention. he notes associated nausea resulting in 3-4 episodes of nonbloody, nonbilious emesis over the last 3 to 4 days, most recent such episode of emesis occurring just prior to presenting to the emergency department today.  He notes associated decline in stool output relative to baseline reports continuation of flatus passage.  No recent melena or hematochezia.  Denies any preceding trauma or travel.  Not associate with any acute dysuria, gross hematuria, or change in urinary urgency/frequency.  In the setting of the aforementioned abdominal pain with nausea/vomiting, the patient reports significant decline in his oral intake over the last few days.  Denies any associated subjective fever, chills, rigors, or generalized myalgias.  No recent headache, neck stiffness, rhinitis, rhinorrhea, sore throat, shortness of breath, cough, or rash.  No recent known COVID-19 exposures.   Denies any associated chest pain, diaphoresis, or palpitations.  He was recently diagnosed with pancreatic cancer in July 22 and has been undergoing chemotherapy, with most recent session occurring this last week.  He follows with Dr.Dorsey As his outpatient oncologist, and is scheduled for his next chemotherapy session on Thursday, 05/08/2021, and has Port-A-Cath in place.  He has undergone biliary stent placement on 2 different occasions, most recently on 03/27/2021.      ED Course:  Vital signs in the ED were notable for the following: Temperature max 99.9; heart rate initially noted to be 121, which decreased to 98 following interval IV fluids; blood pressure 109/63 -139/85; respiratory rate 16-23, oxygen saturation 94 to 97% on room air.  Labs were notable for the following: CMP was notable for the following: Sodium 126, which corrected to 128 when taking into account concomitant hyperglycemia, potassium 3.0, chloride 85, bicarbonate 28, anion gap 13, BUN 22, creatinine 0.61 compared to most recent prior creatinine value 0.67 on 04/24/2021, BUN/creatinine ratio 36, glucose 241, albumin 3.6, alkaline phosphatase 102, AST 21, ALT 15, total bilirubin 1.5 relative to most recent prior value of 1.6 on 04/24/2021.  Lipase 60.  CBC notable for white cell count 10,200.  Lactic acid x2 values were both found to be 1.1.  Urinalysis showed no white blood cells, no bacteria, leukocyte Estrace negative, nitrate negative, while showing the presence of hyaline casts and was associated with specific gravity greater than 1.030.  Screening nasopharyngeal COVID-19 PCR was performed in the ED this evening, with result currently pending.  Imaging and additional notable ED work-up: CT abdomen/pelvis with contrast showed markedly dilated fluid-filled stomach with fluid distending the distal esophagus,  potentially due to mass-effect from pancreatic mass; common bile duct stent in place, with interval decrease in gallbladder  distention relative to CT abdomen/pelvis on 02/26/2021, while showing equivocal gallbladder wall thickening, but no evidence of Cholecystic fat stranding; colonic diverticulosis without evidence of diverticulitis; no evidence of small bowel obstruction or large bowel obstruction, abscess, or perforation.  EDP discussed the patient's case and imaging with the on-call gastroenterology, who felt that the presentation and imaging were most suggestive of a gastric outlet obstruction.  Per this discussion, gastroenterology to formally consult and will see the patient in the morning, and in the interval they recommend n.p.o., placement of nasogastric tube attached to low intermittent wall suction.   While in the ED, the following were administered: Morphine 4 mg IV x2, Zofran 4 mg IV x1, Protonix 40 mg IV x1, Compazine 10 mg IV x1, normal saline x1 L bolus, potassium chloride 10 mEq IV over 1 hour x 1 dose.  Subsequently, the patient was admitted to the med telemetry floor for further evaluation management of suspected gastric outlet obstruction along with associated symptomatic control and management of multiple electrolyte abnormalities, as further detailed below.    Review of Systems: As per HPI otherwise 10 point review of systems negative.   Past Medical History:  Diagnosis Date   Barrett's esophagus dx 2016   Bronchitis    Chronic hepatitis C without hepatic coma ([place]) 10/31/2014   Depression    Diabetes ([place])    ED (erectile dysfunction)    GERD (gastroesophageal reflux disease)    Hepatitis C    Hiatal hernia 10/2014   3cm   Neuropathy    S/P transmetatarsal amputation of foot, left ([place]) 11/28/2020    Past Surgical History:  Procedure Laterality Date   AMPUTATION Left 11/15/2020   Procedure: LEFT TRANSMETATARSALS AMPUTATION;  Surgeon: Newt Minion, MD;  Location: [facility];  Service: Orthopedics;  Laterality: Left;   APPENDECTOMY     BILIARY STENT PLACEMENT N/A 02/27/2021   Procedure:  BILIARY STENT PLACEMENT;  Surgeon: Milus Banister, MD;  Location: [facility];  Service: Endoscopy;  Laterality: N/A;   BILIARY STENT PLACEMENT N/A 03/27/2021   Procedure: BILIARY STENT PLACEMENT;  Surgeon: Jackquline Denmark, MD;  Location: [facility];  Service: Endoscopy;  Laterality: N/A;   BIOPSY  03/27/2021   Procedure: BIOPSY;  Surgeon: Jackquline Denmark, MD;  Location: [facility];  Service: Endoscopy;;   COLONOSCOPY N/A 02/16/2014   Procedure: COLONOSCOPY;  Surgeon: Gatha Mayer, MD;  Location: [facility];  Service: Endoscopy;  Laterality: N/A;   COLONOSCOPY     ENDOSCOPIC RETROGRADE CHOLANGIOPANCREATOGRAPHY (ERCP) WITH PROPOFOL N/A 02/27/2021   Procedure: ENDOSCOPIC RETROGRADE CHOLANGIOPANCREATOGRAPHY (ERCP) WITH PROPOFOL;  Surgeon: Milus Banister, MD;  Location: [facility];  Service: Endoscopy;  Laterality: N/A;   ERCP N/A 03/27/2021   Procedure: ENDOSCOPIC RETROGRADE CHOLANGIOPANCREATOGRAPHY (ERCP);  Surgeon: Jackquline Denmark, MD;  Location: [facility];  Service: Endoscopy;  Laterality: N/A;   EUS N/A 02/27/2021   Procedure: UPPER ENDOSCOPIC ULTRASOUND (EUS) RADIAL;  Surgeon: Milus Banister, MD;  Location: [facility];  Service: Endoscopy;  Laterality: N/A;   FINE NEEDLE ASPIRATION N/A 02/27/2021   Procedure: FINE NEEDLE ASPIRATION (FNA) LINEAR;  Surgeon: Milus Banister, MD;  Location: [facility];  Service: Endoscopy;  Laterality: N/A;   IR IMAGING GUIDED PORT INSERTION  03/21/2021   SPHINCTEROTOMY  02/27/2021   Procedure: SPHINCTEROTOMY;  Surgeon: Milus Banister, MD;  Location: [facility];  Service: Endoscopy;;   STENT REMOVAL  03/27/2021   Procedure: STENT REMOVAL;  Surgeon: Jackquline Denmark, MD;  Location: [facility];  Service: Endoscopy;;   TONSILLECTOMY      Social History:  reports that he has been smoking cigarettes. He has a 17.50 pack-year smoking history. He has never used smokeless tobacco. He reports that he does not currently use alcohol. He reports that he does  not use drugs.   Allergies  Allergen Reactions   Bee Venom Anaphylaxis   Lactose Intolerance (Gi) Other (See Comments)    GI upset    Family History  Problem Relation Age of Onset   Breast cancer Mother    Stroke Father    Alcohol abuse Father    Heart disease Maternal Grandfather    Pancreatic cancer Paternal Grandmother    Diabetes Paternal Grandfather    Colon cancer Neg Hx    Stomach cancer Neg Hx    Rectal cancer Neg Hx    Esophageal cancer Neg Hx     Family history reviewed and not pertinent    Prior to Admission medications   Medication Sig Start Date End Date Taking? Authorizing Provider  albuterol (VENTOLIN HFA) 108 (90 Base) MCG/ACT inhaler Inhale 2 puffs into the lungs every 6 (six) hours as needed for wheezing or shortness of breath. 12/05/20   Gerlene Fee, NP  diphenoxylate-atropine (LOMOTIL) 2.5-0.025 MG tablet Take 1 tablet by mouth 4 (four) times daily as needed for diarrhea or loose stools. 04/07/21   [name], NP  DULoxetine (CYMBALTA) 60 MG capsule Take 1 capsule (60 mg total) by mouth daily. 04/04/21 07/03/21  Charlott Rakes, MD  EPINEPHrine 0.3 mg/0.3 mL IJ SOAJ injection Inject 0.3 mg into the muscle as needed for anaphylaxis. 06/11/20   [provider]  folic acid (FOLVITE) 1 MG tablet Take 1 tablet (1 mg total) by mouth in the morning. 03/28/21   Rai, Ripudeep K, MD  glucose blood (TRUE METRIX BLOOD GLUCOSE TEST) test strip Use as instructed. Check blood glucose level by fingerstick 3-4 times per day.  E11.65 03/04/21   [name], NP  insulin glargine (LANTUS) 100 UNIT/ML injection Inject 0.2 mLs (20 Units total) into the skin 2 (two) times daily. 05/02/21   Charlott Rakes, MD  insulin lispro (HUMALOG) 100 UNIT/ML KwikPen Inject 0-9 Units into the skin 3 (three) times daily. Sliding scale CBG 70 - 120: 0 units CBG 121 - 150: 1 unit,  CBG 151 - 200: 2 units,  CBG 201 - 250: 3 units,  CBG 251 - 300: 5 units,  CBG 301 - 350: 7 units,  CBG  351 - 400: 9 units   CBG > 400: 9 units and notify your MD 03/28/21   Rai, Vernelle Emerald, MD  insulin lispro (HUMALOG) 200 UNIT/ML KwikPen Inject 7 Units into the skin 3 (three) times daily before meals. 03/28/21   Rai, Ripudeep K, MD  Insulin Pen Needle (TECHLITE PEN NEEDLES) 32G X 4 MM MISC USE AS INSTRUCTED INTO THE SKIN TWICE DAILY 03/04/21   [name], NP  Insulin Pen Needle (TRUEPLUS PEN NEEDLES) 32G X 4 MM MISC Use as instructed. Inject into the skin twice daily 03/04/21   [name], NP  lidocaine-diphenhydrAMINE-alum & mag hydroxide-simeth-distilled water in nystatin suspension Swish/gargle and spit/swallow 5 ml by mouth 4 times a day as needed. 04/16/21   Dede Query T, PA-C  lidocaine-prilocaine (EMLA) cream Apply 1 application topically as directed as needed. 03/12/21   Orson Slick, MD  lidocaine-prilocaine (EMLA) cream Apply to affected area once 04/10/21   Orson Slick, MD  lipase/protease/amylase (CREON) 36000 UNITS CPEP capsule Take 2 capsules (72,000 Units total) by mouth 3 (three) times daily with meals. May also take 1 capsule (36,000 Units total) as needed (with snacks). NEEDS PASS. 04/07/21   [name], NP  losartan (COZAAR) 50 MG tablet Take 1 tablet (50 mg total) by mouth daily. 03/28/21 03/28/22  Rai, Vernelle Emerald, MD  magic mouthwash w/lidocaine SOLN Take 5 mLs by mouth 4 (four) times daily as needed. 04/16/21   Lincoln Brigham, PA-C  mirtazapine (REMERON) 30 MG tablet Take 1 tablet (30 mg total) by mouth at bedtime. 01/07/21 06/06/21  [name], NP  Multiple Vitamins-Minerals (THEREMS-M) TABS Take 1 tablet by mouth in the morning.    [provider]  omeprazole (PRILOSEC) 40 MG capsule Take 1 capsule (40 mg total) by mouth in the morning. 03/28/21 03/28/22  Rai, Ripudeep K, MD  ondansetron (ZOFRAN) 8 MG tablet Take 1 tablet (8 mg total) by mouth 2 (two) times daily as needed. Start on day 3 after chemotherapy. 04/10/21   Orson Slick, MD   oxyCODONE (OXY IR/ROXICODONE) 5 MG immediate release tablet Take 1 to 2 tablets by mouth every 6  hours as needed for severe pain. 04/28/21   Orson Slick, MD  prochlorperazine (COMPAZINE) 10 MG tablet Take 1 tablet (10 mg total) by mouth every 6 (six) hours as needed for nausea or vomiting. 03/28/21   Rai, Vernelle Emerald, MD  prochlorperazine (COMPAZINE) 10 MG tablet Take 1 tablet (10 mg total) by mouth every 6 (six) hours as needed (Nausea or vomiting). 04/10/21   Orson Slick, MD  TRUEplus Lancets 28G MISC Use as instructed. Check blood glucose level by fingerstick 3-4 times per day.   E11.65 03/04/21   [name], NP     Objective    Physical Exam: Vitals:   05/04/21 2030 05/04/21 2045 05/04/21 2100 05/04/21 2115  BP: 121/66 119/62 130/71 109/63  Pulse: 93 87 97 98  Resp: (!) 23 (!) 22 (!) 25 (!) 25  Temp:      TempSrc:      SpO2: 93% 94% 97% 96%    General: appears to be stated age; alert, oriented Skin: warm, dry, no rash Head:  AT/[place] Mouth:  Oral mucosa membranes appear dry, normal dentition Neck: supple; trachea midline Heart:  RRR; did not appreciate any M/R/G Lungs: CTAB, did not appreciate any wheezes, rales, or rhonchi Abdomen: + BS; soft, mildly distended, mild diffuse tenderness to palpation in the absence of any associated guarding, rigidity, or rebound tenderness. Vascular: 2+ pedal pulses b/l; 2+ radial pulses b/l Extremities: no peripheral edema, no muscle wasting Neuro: strength and sensation intact in upper and lower extremities b/l     Labs on Admission: I have personally reviewed following labs and imaging studies  CBC: Recent Labs  Lab 05/04/21 1801  WBC 10.2  NEUTROABS 7.1  HGB 13.4  HCT 38.8*  MCV 88.6  PLT 0000000   Basic Metabolic Panel: Recent Labs  Lab 05/04/21 1801  NA 126*  K 3.0*  CL 85*  CO2 28  GLUCOSE 241*  BUN 22*  CREATININE 0.61  CALCIUM 9.2   GFR: Estimated Creatinine Clearance: 100.4 mL/min (by C-G formula  based on SCr of 0.61 mg/dL). Liver Function Tests: Recent Labs  Lab 05/04/21 1801  AST 21  ALT 15  ALKPHOS 102  BILITOT 1.5*  PROT 8.2*  ALBUMIN 3.6   Recent Labs  Lab 05/04/21 1801  LIPASE 16   No results for input(s): AMMONIA in the last 168 hours. Coagulation Profile: No results for input(s): INR, PROTIME in the last 168 hours. Cardiac Enzymes: No results for input(s): CKTOTAL, CKMB, CKMBINDEX, TROPONINI in the last 168 hours. BNP (last 3 results) No results for input(s): PROBNP in the last 8760 hours. HbA1C: No results for input(s): HGBA1C in the last 72 hours. CBG: No results for input(s): GLUCAP in the last 168 hours. Lipid Profile: No results for input(s): CHOL, HDL, LDLCALC, TRIG, CHOLHDL, LDLDIRECT in the last 72 hours. Thyroid Function Tests: No results for input(s): TSH, T4TOTAL, FREET4, T3FREE, THYROIDAB in the last 72 hours. Anemia Panel: No results for input(s): VITAMINB12, FOLATE, FERRITIN, TIBC, IRON, RETICCTPCT in the last 72 hours. Urine analysis:    Component Value Date/Time   COLORURINE YELLOW (A) 05/04/2021 1746   APPEARANCEUR CLEAR (A) 05/04/2021 1746   LABSPEC >1.030 (H) 05/04/2021 1746   PHURINE 6.0 05/04/2021 1746   GLUCOSEU 250 (A) 05/04/2021 1746   HGBUR NEGATIVE 05/04/2021 1746   BILIRUBINUR MODERATE (A) 05/04/2021 1746   BILIRUBINUR neg 08/03/2017 0959   KETONESUR 40 (A) 05/04/2021 1746   PROTEINUR 100 (A) 05/04/2021 1746   UROBILINOGEN 0.2 08/03/2017 0959   UROBILINOGEN 0.2 09/28/2013 1520   NITRITE NEGATIVE 05/04/2021 1746   LEUKOCYTESUR NEGATIVE 05/04/2021 1746    Radiological Exams on Admission: CT Abdomen Pelvis W Contrast  Result Date: 05/04/2021 CLINICAL DATA:  Diverticulitis suspected abdominal pain Patient reports left lower quadrant pain. Fever. Nausea and vomiting. History of pancreatic cancer, active chemotherapy. EXAM: CT ABDOMEN AND PELVIS WITH CONTRAST TECHNIQUE: Multidetector CT imaging of the abdomen and pelvis was  performed using the standard protocol following bolus administration of intravenous contrast. CONTRAST:  3m OMNIPAQUE IOHEXOL 350 MG/ML SOLN COMPARISON:  Most recent CT noncontrast exam 02/26/2021. FINDINGS: Lower chest: Hypoventilatory atelectasis in the right lower lobe. No pulmonary nodule or pleural effusion. Fluid-filled dilated distal esophagus. Normal heart size with coronary artery calcifications. Hepatobiliary: Pneumobilia likely related to intervening ERCP. Common bile duct stent is in place. No intrahepatic biliary ductal dilatation. Decreased gallbladder distension from prior exam. Equivocal gallbladder wall thickening but no pericholecystic fat stranding. Suspected tiny gallstones. Pancreas: Pancreatic head mass is difficult to delineate on the current exam, however appears decreased in size from prior currently measuring approximately 2.8 x 2.9 cm, series 2, image 40. There is distal pancreatic atrophy and ductal dilatation with pancreatic duct measuring 4 mm. No definite peripancreatic fat stranding or inflammation. Spleen: Normal in size without focal abnormality. Adrenals/Urinary Tract: No adrenal nodule. No hydronephrosis or perinephric edema. Homogeneous renal enhancement with symmetric excretion on delayed phase imaging. Tiny hypodensity in the mid lower right kidney is too small to characterize. Urinary bladder is physiologically distended without wall thickening. Stomach/Bowel: Fluid distends the distal esophagus which is mildly dilated. The stomach is markedly dilated and fluid-filled. There may be mass effect on the gastroduodenal junction, series 2, image 38, from pancreatic mass, difficult to accurately define bowel in this region due to lack of enteric contrast. Duodenum is difficult to define, however there is fluid within the duodenum which is nondistended. Few fluid-filled small bowel loops without abnormal small bowel distension or inflammation. Appendix not visualized, appendectomy  per history. Moderate volume of colonic stool. There is no colonic wall thickening or inflammation. Diverticulosis from the splenic flexure distally. No diverticulitis. Vascular/Lymphatic: Moderate aortic and branch atherosclerosis.  Portal caval node measuring 1.2 x 2.3 cm, not significantly changed in size allowing for differences in caliper placement. Periportal node has likely increased from prior, 15 mm short axis series 2, image 26. There are multiple additional prominent nodes in the porta hepatis. No portal vein thrombus. The superior mesenteric vein is attenuated. The splenic vein is attenuated. No evidence of thrombus or occlusion. There are some left upper quadrant collaterals. Reproductive: Prostatic calcifications. Other: No ascites.  No free air.  No omental thickening. Musculoskeletal: Degenerative change in the lumbar spine with primarily facet hypertrophy. No focal bone lesion or acute osseous abnormalities are seen IMPRESSION: 1. Markedly dilated fluid-filled stomach, with fluid distending the distal esophagus. This may be due to mass effect from pancreatic mass, however the soft tissue planes adjacent to the distal stomach are not well-defined on this current exam in the absence enteric contrast. 2. Common bile duct stent in place with pneumobilia. Decreased gallbladder distension from prior exam. Equivocal gallbladder wall thickening but no pericholecystic fat stranding. 3. Known pancreatic head mass is difficult to delineate, however appears decreased in size from prior exam. Periportal adenopathy, with slight increase size of some of the periportal nodes. 4. Colonic diverticulosis without diverticulitis. 5. Attenuated superior mesenteric and splenic veins without thrombus or occlusion. There are left upper quadrant collaterals. Aortic Atherosclerosis (ICD10-I70.0). Electronically Signed   By: Keith Rake M.D.   On: 05/04/2021 20:17      Assessment/Plan   [name] [name] is a 60 y.o.  male with medical history significant for pancreatic cancer undergoing chemotherapy, type 2 diabetes mellitus, hypertension, who is admitted to [facility] on 05/04/2021 with suspected gastric outlet obstruction after presenting from home to [facility] ED complaining of abdominal pain.    Principal Problem:   Gastric outlet obstruction Active Problems:   Diabetes type 2, controlled (HCC)   Tobacco use disorder   Acute hyponatremia   Hypokalemia   Nausea & vomiting   Abdominal pain   Dehydration     #) Gastric outlet obstruction: Suspected diagnosis in the setting of presenting abdominal pain, associated with nausea/vomiting, abdominal distention and with CT abdomen/pelvis showing evidence of markedly dilated fluid-filled stomach , potentially due to mass-effect from known pancreatic mass.  Case and imaging was discussed with the on-call gastroenterologist, who felt that the presentation and imaging were most suggestive of a gastric outlet obstruction.  Per this discussion, gastroenterology to formally consult and will see the patient in the morning, and in the interval they recommend n.p.o. as well as placement of nasogastric tube attached to low intermittent wall suction.  Of note, CT abdomen/pelvis showed no evidence of overt acute cholecystitis, and noted the presence of the previously placed common bile duct stent, without evidence of common bile duct dilation or choledocholithiasis, and no radiographic evidence of small bowel or large bowel obstruction, abscess, or perforation.  Physical exam reveals no evidence of associated peritoneal signs.  Plan: NPO.  Gastroenterology formally consulted, as above.  NGT attached to low intermittent wall suction.  Lactated Ringer's at 75 cc/h.  As needed IV Dilaudid.  As needed IV Zofran.  Further evaluation management of associated hypokalemia and hyponatremia, as further detailed below.  Repeat CMP and CBC in the morning.     #) Hypokalemia:  Presenting serum potassium level 3.0, as a consequence of recent increase in GI losses in the form of nausea/vomiting as well as associated decline in oral intake.  He received him equivalents of potassium chloride in the  ED.  Plan: We will provide additional 40 mEq of IV potassium chloride over 4 hours x 1 now.  Add on serum magnesium level.  Repeat CMP in the morning at which time we will also repeat serum magnesium level.  Monitor on telemetry.  Further evaluation suspected gastric outlet obstruction and associated nausea/vomiting, as further detailed above.       #) Acute hypo-osmolar hypovolemic hyponatremia: Serum sodium level noted to be 126, with correction to 128 when taking into account concomitant mild hyperglycemia, relative to corrected serum sodium of 138 on 04/24/2021. Suspect that this is hypovolemic in the setting of dehydration from recent increase in GI losses from nausea/vomiting as well as associated decline taken as further detailed above.  MSK pharmacologic contribution from home Cymbalta, although this is unlikely to be the primary source of patient's hyponatremia.  Provide gentle IV fluids overnight, while further evaluating his hyponatremia, including assessing for any contribution from SIADH, as further detailed below.  No evidence of associated acute focal neurologic deficits, no report of recent trauma.   Plan: monitor strict I's and O's and daily weights.  check UA, random urine sodium, urine osmolality.  Check serum osmolality to confirm suspected hypoosmolar etiology.  Repeat BMP in the morning. Check TSH.  Lactated Ringer's at 75 cc/h.  Hold home Cymbalta.     #) Dehydration: Physical exam and laboratory evidence suggestive of such, with the appearance of dry oral mucosa membranes as well as the presence of interval development of prerenal azotemia as well as elevated specific gravity and hyaline casts identified on today's urinalysis.  Consistent with the patient's  history of recent increase in GI losses concomitant with decline in oral intake, as above.  Not associated with any hypotension.  Initial mild tachycardia improved following interval IV fluids, further suggestive of element of dehydration.  Plan: Lactated Ringer's at 75 cc/h.  Monitor strict I's and O's and daily weights.  As needed IV Zofran for nausea.  Repeat CMP in the morning.  Add on serum magnesium level.  Further evaluation of suspected gastric outlet obstruction, as above.      #) Type 2 diabetes mellitus: Documented history of such, complicated by diabetic peripheral polyneuropathy.  Outpatient insulin regimen consists of Lantus 25 units subcu nightly as well as 20 units subcu nightly.  Additionally, he is on sliding scale Humalog 3 times daily with meals.  Presenting blood sugar noted to be 241, without evidence of anion gap metabolic acidosis.  will resume a portion of his home basal insulin for now, as further quantified below.   Plan: Lantus 7 units subcu twice daily.  In the setting of current n.p.o. status, Accu-Cheks every 6 hours with low dose sliding scale insulin.      #) GERD: Documented history of such, on omeprazole as an outpatient.  In setting of current n.p.o. status, will convert to IV Protonix for now.  Plan: Hold home omeprazole for now and lieu of Protonix 40 mg IV daily.       #) Essential hypertension: Documented history of such, on losartan as an outpatient.  Noted to be normotensive in the ED thus far.  Plan: In the setting of current n.p.o. status, hold home losartan for now.  Close monitoring of symptom pressure via routine vital signs.       #) Chronic tobacco abuse: He is a current smoker, having smoked half pack per day over the last 20 years.  Plan: Counseled the patient importance of complete smoking discontinuation.  As needed nicotine patch has been ordered for use during this hospitalization.    DVT prophylaxis: SCDs Code Status:  Full code Family Communication: non Disposition Plan: Per Rounding Team Consults called: GI consulted, as further detailed above;  Admission status: Inpatient; med telemetry     Of note, this patient was added by me to the following Admit List/Treatment Team: [email or web address].      PLEASE NOTE THAT DRAGON DICTATION SOFTWARE WAS USED IN THE CONSTRUCTION OF THIS NOTE.   [facility] Pager [phone] From [place]  Otherwise, please contact night-coverage  [email or web address] Password [facility]   05/04/2021, 9:44 PM

## 2021-05-04 NOTE — ED Triage Notes (Signed)
Pt reports LLQ pain that occasionally radiates to back. Pt also endorses fever and N/V. Pt is actively receiving chemo.

## 2021-05-05 DIAGNOSIS — R109 Unspecified abdominal pain: Secondary | ICD-10-CM | POA: Diagnosis present

## 2021-05-05 DIAGNOSIS — K311 Adult hypertrophic pyloric stenosis: Secondary | ICD-10-CM | POA: Diagnosis present

## 2021-05-05 DIAGNOSIS — E119 Type 2 diabetes mellitus without complications: Secondary | ICD-10-CM

## 2021-05-05 DIAGNOSIS — Z794 Long term (current) use of insulin: Secondary | ICD-10-CM

## 2021-05-05 DIAGNOSIS — E860 Dehydration: Secondary | ICD-10-CM | POA: Diagnosis present

## 2021-05-05 DIAGNOSIS — K8689 Other specified diseases of pancreas: Secondary | ICD-10-CM

## 2021-05-05 DIAGNOSIS — C250 Malignant neoplasm of head of pancreas: Principal | ICD-10-CM

## 2021-05-05 DIAGNOSIS — R1012 Left upper quadrant pain: Secondary | ICD-10-CM

## 2021-05-05 LAB — CBC
HCT: 34.9 % — ABNORMAL LOW (ref 39.0–52.0)
Hemoglobin: 12.1 g/dL — ABNORMAL LOW (ref 13.0–17.0)
MCH: 30.7 pg (ref 26.0–34.0)
MCHC: 34.7 g/dL (ref 30.0–36.0)
MCV: 88.6 fL (ref 80.0–100.0)
Platelets: 183 10*3/uL (ref 150–400)
RBC: 3.94 MIL/uL — ABNORMAL LOW (ref 4.22–5.81)
RDW: 13.2 % (ref 11.5–15.5)
WBC: 8.3 10*3/uL (ref 4.0–10.5)
nRBC: 0 % (ref 0.0–0.2)

## 2021-05-05 LAB — GLUCOSE, CAPILLARY
Glucose-Capillary: 130 mg/dL — ABNORMAL HIGH (ref 70–99)
Glucose-Capillary: 157 mg/dL — ABNORMAL HIGH (ref 70–99)
Glucose-Capillary: 184 mg/dL — ABNORMAL HIGH (ref 70–99)
Glucose-Capillary: 83 mg/dL (ref 70–99)
Glucose-Capillary: 90 mg/dL (ref 70–99)
Glucose-Capillary: 95 mg/dL (ref 70–99)

## 2021-05-05 LAB — COMPREHENSIVE METABOLIC PANEL
ALT: 15 U/L (ref 0–44)
AST: 21 U/L (ref 15–41)
Albumin: 3.2 g/dL — ABNORMAL LOW (ref 3.5–5.0)
Alkaline Phosphatase: 89 U/L (ref 38–126)
Anion gap: 9 (ref 5–15)
BUN: 17 mg/dL (ref 6–20)
CO2: 28 mmol/L (ref 22–32)
Calcium: 9 mg/dL (ref 8.9–10.3)
Chloride: 94 mmol/L — ABNORMAL LOW (ref 98–111)
Creatinine, Ser: 0.49 mg/dL — ABNORMAL LOW (ref 0.61–1.24)
GFR, Estimated: >60 mL/min (ref >60)
Glucose, Bld: 148 mg/dL — ABNORMAL HIGH (ref 70–99)
Potassium: 3.5 mmol/L (ref 3.5–5.1)
Sodium: 131 mmol/L — ABNORMAL LOW (ref 135–145)
Total Bilirubin: 1.1 mg/dL (ref 0.3–1.2)
Total Protein: 7.4 g/dL (ref 6.5–8.1)

## 2021-05-05 LAB — OSMOLALITY: Osmolality: 278 mOsm/kg (ref 275–295)

## 2021-05-05 LAB — MAGNESIUM: Magnesium: 1.5 mg/dL — ABNORMAL LOW (ref 1.7–2.4)

## 2021-05-05 LAB — RESP PANEL BY RT-PCR (FLU A&B, COVID) ARPGX2
Influenza A by PCR: NEGATIVE
Influenza B by PCR: NEGATIVE
SARS Coronavirus 2 by RT PCR: NEGATIVE

## 2021-05-05 LAB — PHOSPHORUS: Phosphorus: 3.2 mg/dL (ref 2.5–4.6)

## 2021-05-05 LAB — TSH: TSH: 2.672 u[IU]/mL (ref 0.350–4.500)

## 2021-05-05 MED ORDER — DEXTROSE 5 % IV SOLN: INTRAVENOUS | Status: AC

## 2021-05-05 MED ORDER — NICOTINE 14 MG/24HR TD PT24: 14.0000 mg | MEDICATED_PATCH | Freq: Every day | TRANSDERMAL | Status: DC | PRN

## 2021-05-05 MED ORDER — LORAZEPAM 2 MG/ML IJ SOLN
0.5000 mg | Freq: Once | INTRAMUSCULAR | Status: AC
  Administered 2021-05-05: 0.5 mg via INTRAVENOUS

## 2021-05-05 MED ORDER — POTASSIUM CHLORIDE IN NACL 20-0.9 MEQ/L-% IV SOLN
INTRAVENOUS | Status: DC
  Filled 2021-05-05 (×2): qty 1000

## 2021-05-05 MED ORDER — MAGNESIUM SULFATE 2 GM/50ML IV SOLN
2.0000 g | Freq: Once | INTRAVENOUS | Status: AC
  Administered 2021-05-05: 2 g via INTRAVENOUS
  Filled 2021-05-05: qty 50

## 2021-05-05 NOTE — Progress Notes (Signed)
Bladder scan 183ml

## 2021-05-05 NOTE — Progress Notes (Signed)
Pt arrived to unit from ED with orders to place STAT NG. This RN attempted x2 to place NG, adjusting Fr. Size. ICU/Rapid Response nurse consulted and attempted as well - not successful. Provider notified - no NG placed at this moment. Pain and nausea well controlled with PRNs. 5 bags of K+ administered per orders.

## 2021-05-05 NOTE — Consult Note (Addendum)
Referring Provider:  Dr. Cyndia Skeeters, [facility] Primary Care Physician:  [name], NP Primary Gastroenterologist:  Dr. Havery Moros  Reason for Consultation:  Suspected GOO, known pancreatic cancer  HPI: [name] [name] is a 60 y.o. male with medical history significant for pancreatic cancer undergoing chemotherapy, type 2 diabetes mellitus, and hypertension who is admitted to [facility] on 05/04/2021 with suspected gastric outlet obstruction after presenting from home to [facility] ED complaining of abdominal pain and nausea with vomiting.  Abdominal pain has been present for 4-5 days but progressive over the past couple of days.  Also had episodes of large volume, non-bloody emesis.  No passing any stool recently but is passing flatus.    CT scan of the abdomen and pelvis with contrast here showed the following:  IMPRESSION: 1. Markedly dilated fluid-filled stomach, with fluid distending the distal esophagus. This may be due to mass effect from pancreatic mass, however the soft tissue planes adjacent to the distal stomach are not well-defined on this current exam in the absence enteric contrast. 2. Common bile duct stent in place with pneumobilia. Decreased gallbladder distension from prior exam. Equivocal gallbladder wall thickening but no pericholecystic fat stranding. 3. Known pancreatic head mass is difficult to delineate, however appears decreased in size from prior exam. Periportal adenopathy, with slight increase size of some of the periportal nodes. 4. Colonic diverticulosis without diverticulitis. 5. Attenuated superior mesenteric and splenic veins without thrombus or occlusion. There are left upper quadrant collaterals.   Aortic Atherosclerosis (ICD10-I70.0).   ERCP  03/27/2021: - Distal CBD stricture s/p insertion of covered metal biliary stent.   LFTs are normal.  Past Medical History:  Diagnosis Date   Barrett's esophagus dx 2016   Bronchitis    Chronic hepatitis C  without hepatic coma ([place]) 10/31/2014   Depression    Diabetes ([place])    ED (erectile dysfunction)    GERD (gastroesophageal reflux disease)    Hepatitis C    Hiatal hernia 10/2014   3cm   Neuropathy    S/P transmetatarsal amputation of foot, left ([place]) 11/28/2020    Past Surgical History:  Procedure Laterality Date   AMPUTATION Left 11/15/2020   Procedure: LEFT TRANSMETATARSALS AMPUTATION;  Surgeon: Newt Minion, MD;  Location: [place];  Service: Orthopedics;  Laterality: Left;   APPENDECTOMY     BILIARY STENT PLACEMENT N/A 02/27/2021   Procedure: BILIARY STENT PLACEMENT;  Surgeon: Milus Banister, MD;  Location: [facility];  Service: Endoscopy;  Laterality: N/A;   BILIARY STENT PLACEMENT N/A 03/27/2021   Procedure: BILIARY STENT PLACEMENT;  Surgeon: Jackquline Denmark, MD;  Location: [facility];  Service: Endoscopy;  Laterality: N/A;   BIOPSY  03/27/2021   Procedure: BIOPSY;  Surgeon: Jackquline Denmark, MD;  [facility];  Service: Endoscopy;;   COLONOSCOPY N/A 02/16/2014   Procedure: COLONOSCOPY;  Surgeon: Gatha Mayer, MD;  [facility];  Service: Endoscopy;  Laterality: N/A;   COLONOSCOPY     ENDOSCOPIC RETROGRADE CHOLANGIOPANCREATOGRAPHY (ERCP) WITH PROPOFOL N/A 02/27/2021   Procedure: ENDOSCOPIC RETROGRADE CHOLANGIOPANCREATOGRAPHY (ERCP) WITH PROPOFOL;  Surgeon: Milus Banister, MD;  [facility];  Service: Endoscopy;  Laterality: N/A;   ERCP N/A 03/27/2021   Procedure: ENDOSCOPIC RETROGRADE CHOLANGIOPANCREATOGRAPHY (ERCP);  Surgeon: Jackquline Denmark, MD;  [facility];  Service: Endoscopy;  Laterality: N/A;   EUS N/A 02/27/2021   Procedure: UPPER ENDOSCOPIC ULTRASOUND (EUS) RADIAL;  Surgeon: Milus Banister, MD;  [facility];  Service: Endoscopy;  Laterality: N/A;  FINE NEEDLE ASPIRATION N/A 02/27/2021   Procedure: FINE NEEDLE ASPIRATION (FNA) LINEAR;  Surgeon: Milus Banister, MD;  [facility];  Service: Endoscopy;   Laterality: N/A;   IR IMAGING GUIDED PORT INSERTION  03/21/2021   SPHINCTEROTOMY  02/27/2021   Procedure: SPHINCTEROTOMY;  Surgeon: Milus Banister, MD;  [facility];  Service: Endoscopy;;   STENT REMOVAL  03/27/2021   Procedure: STENT REMOVAL;  Surgeon: Jackquline Denmark, MD;  [facility];  Service: Endoscopy;;   TONSILLECTOMY      Prior to Admission medications   Medication Sig Start Date End Date Taking? Authorizing Provider  albuterol (VENTOLIN HFA) 108 (90 Base) MCG/ACT inhaler Inhale 2 puffs into the lungs every 6 (six) hours as needed for wheezing or shortness of breath. 12/05/20  Yes Gerlene Fee, NP  atorvastatin (LIPITOR) 40 MG tablet Take 40 mg by mouth daily.   Yes [provider]  diphenoxylate-atropine (LOMOTIL) 2.5-0.025 MG tablet Take 1 tablet by mouth 4 (four) times daily as needed for diarrhea or loose stools. 04/07/21  Yes [name], NP  DULoxetine (CYMBALTA) 60 MG capsule Take 1 capsule (60 mg total) by mouth daily. 04/04/21 07/03/21 Yes Newlin, Charlane Ferretti, MD  EPINEPHrine 0.3 mg/0.3 mL IJ SOAJ injection Inject 0.3 mg into the muscle as needed for anaphylaxis. 06/11/20  Yes [provider]  folic acid (FOLVITE) 1 MG tablet Take 1 tablet (1 mg total) by mouth in the morning. 03/28/21  Yes Rai, Ripudeep K, MD  insulin glargine (LANTUS) 100 UNIT/ML injection Inject 0.2 mLs (20 Units total) into the skin 2 (two) times daily. Patient taking differently: Inject 20-25 Units into the skin as directed. Inject 25 units in the morning and Inject 20 units at bedtime 05/02/21  Yes Newlin, Enobong, MD  insulin lispro (HUMALOG) 200 UNIT/ML KwikPen Inject 7 Units into the skin 3 (three) times daily before meals. Patient taking differently: Inject 0-9 Units into the skin 3 (three) times daily before meals. Sliding scale CBG 70 - 120: 0 units CBG 121 - 150: 1 unit,  CBG 151 - 200: 2 units,  CBG 201 - 250: 3 units,  CBG 251 - 300: 5 units,  CBG 301 - 350: 7 units,   CBG 351 - 400: 9 units   CBG > 400: 9 units and notify your MD 03/28/21  Yes Rai, Ripudeep K, MD  lidocaine-prilocaine (EMLA) cream Apply 1 application topically as directed as needed. Patient taking differently: Apply 1 application topically as needed (access port). 03/12/21  Yes Orson Slick, MD  lipase/protease/amylase (CREON) 36000 UNITS CPEP capsule Take 2 capsules (72,000 Units total) by mouth 3 (three) times daily with meals. May also take 1 capsule (36,000 Units total) as needed (with snacks). NEEDS PASS. 04/07/21  Yes [name], NP  losartan (COZAAR) 50 MG tablet Take 1 tablet (50 mg total) by mouth daily. 03/28/21 03/28/22 Yes Rai, Ripudeep K, MD  magic mouthwash w/lidocaine SOLN Take 5 mLs by mouth 4 (four) times daily as needed. Patient taking differently: Take 5 mLs by mouth 4 (four) times daily as needed for mouth pain. 04/16/21  Yes Dede Query T, PA-C  mirtazapine (REMERON) 30 MG tablet Take 1 tablet (30 mg total) by mouth at bedtime. 01/07/21 06/06/21 Yes [name], NP  Multiple Vitamins-Minerals (THEREMS-M) TABS Take 1 tablet by mouth in the morning.   Yes [provider]  omeprazole (PRILOSEC) 40 MG capsule Take 1 capsule (40 mg total) by mouth  in the morning. 03/28/21 03/28/22 Yes Rai, Ripudeep K, MD  ondansetron (ZOFRAN) 8 MG tablet Take 1 tablet (8 mg total) by mouth 2 (two) times daily as needed. Start on day 3 after chemotherapy. Patient taking differently: Take 8 mg by mouth 2 (two) times daily as needed for nausea or vomiting. Start on day 3 after chemotherapy. 04/10/21  Yes Orson Slick, MD  oxyCODONE (OXY IR/ROXICODONE) 5 MG immediate release tablet Take 1 to 2 tablets by mouth every 6  hours as needed for severe pain. 04/28/21  Yes Orson Slick, MD  prochlorperazine (COMPAZINE) 10 MG tablet Take 1 tablet (10 mg total) by mouth every 6 (six) hours as needed (Nausea or vomiting). 04/10/21  Yes Ledell Peoples IV, MD  glucose blood (TRUE METRIX BLOOD  GLUCOSE TEST) test strip Use as instructed. Check blood glucose level by fingerstick 3-4 times per day.  E11.65 03/04/21   [name], NP  insulin lispro (HUMALOG) 100 UNIT/ML KwikPen Inject 0-9 Units into the skin 3 (three) times daily. Sliding scale CBG 70 - 120: 0 units CBG 121 - 150: 1 unit,  CBG 151 - 200: 2 units,  CBG 201 - 250: 3 units,  CBG 251 - 300: 5 units,  CBG 301 - 350: 7 units,  CBG 351 - 400: 9 units   CBG > 400: 9 units and notify your MD 03/28/21   Rai, Vernelle Emerald, MD  Insulin Pen Needle (TECHLITE PEN NEEDLES) 32G X 4 MM MISC USE AS INSTRUCTED INTO THE SKIN TWICE DAILY 03/04/21   [name], NP  Insulin Pen Needle (TRUEPLUS PEN NEEDLES) 32G X 4 MM MISC Use as instructed. Inject into the skin twice daily 03/04/21   [name], NP  lidocaine-diphenhydrAMINE-alum & mag hydroxide-simeth-distilled water in nystatin suspension Swish/gargle and spit/swallow 5 ml by mouth 4 times a day as needed. Patient not taking: Reported on 05/04/2021 04/16/21   Dede Query T, PA-C  lidocaine-prilocaine (EMLA) cream Apply to affected area once Patient not taking: Reported on 05/04/2021 04/10/21   Orson Slick, MD  prochlorperazine (COMPAZINE) 10 MG tablet Take 1 tablet (10 mg total) by mouth every 6 (six) hours as needed for nausea or vomiting. Patient not taking: Reported on 05/04/2021 03/28/21   Mendel Corning, MD  TRUEplus Lancets 28G MISC Use as instructed. Check blood glucose level by fingerstick 3-4 times per day.   E11.65 03/04/21   [name], NP    Current Facility-Administered Medications  Medication Dose Route Frequency Provider Last Rate Last Admin   0.9 % NaCl with KCl 20 mEq/ L  infusion   Intravenous Continuous Mercy Riding, MD 75 mL/hr at 05/05/21 0936 New Bag at 05/05/21 0936   acetaminophen (TYLENOL) tablet 650 mg  650 mg Oral Q6H PRN Howerter, Justin B, DO       Or   acetaminophen (TYLENOL) suppository 650 mg  650 mg Rectal Q6H PRN Howerter, Justin B, DO        HYDROmorphone (DILAUDID) injection 0.5 mg  0.5 mg Intravenous Q2H PRN Howerter, Justin B, DO   0.5 mg at 05/05/21 1259   insulin aspart (novoLOG) injection 0-9 Units  0-9 Units Subcutaneous Q6H Howerter, Justin B, DO   1 Units at 05/05/21 1259   insulin glargine-yfgn (SEMGLEE) injection 7 Units  7 Units Subcutaneous BID Howerter, Justin B, DO   7 Units at 05/05/21 0946   LORazepam (ATIVAN) injection 0.5 mg  0.5  mg Intravenous Q6H PRN Howerter, Justin B, DO       naloxone (NARCAN) injection 0.4 mg  0.4 mg Intravenous PRN Howerter, Justin B, DO       nicotine (NICODERM CQ - dosed in mg/24 hours) patch 14 mg  14 mg Transdermal Daily PRN Howerter, Justin B, DO       ondansetron (ZOFRAN) injection 4 mg  4 mg Intravenous Q6H PRN Howerter, Justin B, DO   4 mg at 05/05/21 0600   pantoprazole (PROTONIX) injection 40 mg  40 mg Intravenous Q24H Howerter, Justin B, DO   40 mg at 05/05/21 0945    Allergies as of 05/04/2021 - Review Complete 05/04/2021  Allergen Reaction Noted   Bee venom Anaphylaxis 12/16/2017   Lactose intolerance (gi) Other (See Comments) 02/13/2019    Family History  Problem Relation Age of Onset   Breast cancer Mother    Stroke Father    Alcohol abuse Father    Heart disease Maternal Grandfather    Pancreatic cancer Paternal Grandmother    Diabetes Paternal Grandfather    Colon cancer Neg Hx    Stomach cancer Neg Hx    Rectal cancer Neg Hx    Esophageal cancer Neg Hx     Social History   Socioeconomic History   Marital status: Single    Spouse name: Not on file   Number of children: Not on file   Years of education: Not on file   Highest education level: Not on file  Occupational History   Not on file  Tobacco Use   Smoking status: Every Day    Packs/day: 0.50    Years: 35.00    Pack years: 17.50    Types: Cigarettes   Smokeless tobacco: Never  Vaping Use   Vaping Use: Never used  Substance and Sexual Activity   Alcohol use: Not Currently    Comment:  previous   Drug use: No   Sexual activity: Not on file  Other Topics Concern   Not on file  Social History Narrative   Not on file   Social Determinants of Health   Financial Resource Strain: High Risk   Difficulty of Paying Living Expenses: Very hard  Food Insecurity: Not on file  Transportation Needs: Unmet Transportation Needs   Lack of Transportation (Medical): Yes   Lack of Transportation (Non-Medical): Yes  Physical Activity: Not on file  Stress: Not on file  Social Connections: Not on file  Intimate Partner Violence: Not on file    Review of Systems: ROS is O/W negative except as mentioned in HPI.  Physical Exam: Vital signs in last 24 hours: Temp:  [98.2 F (36.8 C)-100.2 F (37.9 C)] 100.2 F (37.9 C) (09/05 0551) Pulse Rate:  [87-121] 89 (09/05 0551) Resp:  [14-32] 18 (09/05 0551) BP: (109-152)/(62-87) 152/83 (09/05 0551) SpO2:  [93 %-100 %] 96 % (09/05 0551) Weight:  [67.8 kg] 67.8 kg (09/04 2227) Last BM Date: 05/03/21 General:  Alert, Well-developed, well-nourished, pleasant and cooperative in NAD Head:  Normocephalic and atraumatic. Eyes:  Sclera clear, no icterus.  Conjunctiva pink. Ears:  Normal auditory acuity. Mouth:  No deformity or lesions.   Lungs:  Clear throughout to auscultation.  No wheezes, crackles, or rhonchi.  Heart:  Regular rate and rhythm; no murmurs, clicks, rubs, or gallops. Abdomen:  Soft, non-distended.  BS present.  Minimal epigastric TTP. Msk:  Symmetrical without gross deformities. Pulses:  Normal pulses noted. Extremities:  Without clubbing or edema.  Transmetatarsal amputation  of the left foot. Neurologic:  Alert and oriented x 4;  grossly normal neurologically. Skin:  Intact without significant lesions or rashes. Psych:  Alert and cooperative. Normal mood and affect.  Intake/Output from previous day: 09/04 0701 - 09/05 0700 In: 1839.2 [I.V.:439.2; IV Piggyback:1400] Out: -  Intake/Output this shift: Total I/O In: -   Out: 300 [Urine:300]  Lab Results: Recent Labs    05/04/21 1801 05/05/21 0434  WBC 10.2 8.3  HGB 13.4 12.1*  HCT 38.8* 34.9*  PLT 233 183   BMET Recent Labs    05/04/21 1801 05/05/21 0434  NA 126* 131*  K 3.0* 3.5  CL 85* 94*  CO2 28 28  GLUCOSE 241* 148*  BUN 22* 17  CREATININE 0.61 0.49*  CALCIUM 9.2 9.0   LFT Recent Labs    05/05/21 0434  PROT 7.4  ALBUMIN 3.2*  AST 21  ALT 15  ALKPHOS 89  BILITOT 1.1   Studies/Results: CT Abdomen Pelvis W Contrast  Result Date: 05/04/2021 CLINICAL DATA:  Diverticulitis suspected abdominal pain Patient reports left lower quadrant pain. Fever. Nausea and vomiting. History of pancreatic cancer, active chemotherapy. EXAM: CT ABDOMEN AND PELVIS WITH CONTRAST TECHNIQUE: Multidetector CT imaging of the abdomen and pelvis was performed using the standard protocol following bolus administration of intravenous contrast. CONTRAST:  35m OMNIPAQUE IOHEXOL 350 MG/ML SOLN COMPARISON:  Most recent CT noncontrast exam 02/26/2021. FINDINGS: Lower chest: Hypoventilatory atelectasis in the right lower lobe. No pulmonary nodule or pleural effusion. Fluid-filled dilated distal esophagus. Normal heart size with coronary artery calcifications. Hepatobiliary: Pneumobilia likely related to intervening ERCP. Common bile duct stent is in place. No intrahepatic biliary ductal dilatation. Decreased gallbladder distension from prior exam. Equivocal gallbladder wall thickening but no pericholecystic fat stranding. Suspected tiny gallstones. Pancreas: Pancreatic head mass is difficult to delineate on the current exam, however appears decreased in size from prior currently measuring approximately 2.8 x 2.9 cm, series 2, image 40. There is distal pancreatic atrophy and ductal dilatation with pancreatic duct measuring 4 mm. No definite peripancreatic fat stranding or inflammation. Spleen: Normal in size without focal abnormality. Adrenals/Urinary Tract: No adrenal nodule.  No hydronephrosis or perinephric edema. Homogeneous renal enhancement with symmetric excretion on delayed phase imaging. Tiny hypodensity in the mid lower right kidney is too small to characterize. Urinary bladder is physiologically distended without wall thickening. Stomach/Bowel: Fluid distends the distal esophagus which is mildly dilated. The stomach is markedly dilated and fluid-filled. There may be mass effect on the gastroduodenal junction, series 2, image 38, from pancreatic mass, difficult to accurately define bowel in this region due to lack of enteric contrast. Duodenum is difficult to define, however there is fluid within the duodenum which is nondistended. Few fluid-filled small bowel loops without abnormal small bowel distension or inflammation. Appendix not visualized, appendectomy per history. Moderate volume of colonic stool. There is no colonic wall thickening or inflammation. Diverticulosis from the splenic flexure distally. No diverticulitis. Vascular/Lymphatic: Moderate aortic and branch atherosclerosis. Portal caval node measuring 1.2 x 2.3 cm, not significantly changed in size allowing for differences in caliper placement. Periportal node has likely increased from prior, 15 mm short axis series 2, image 26. There are multiple additional prominent nodes in the porta hepatis. No portal vein thrombus. The superior mesenteric vein is attenuated. The splenic vein is attenuated. No evidence of thrombus or occlusion. There are some left upper quadrant collaterals. Reproductive: Prostatic calcifications. Other: No ascites.  No free air.  No omental thickening. Musculoskeletal: Degenerative  change in the lumbar spine with primarily facet hypertrophy. No focal bone lesion or acute osseous abnormalities are seen IMPRESSION: 1. Markedly dilated fluid-filled stomach, with fluid distending the distal esophagus. This may be due to mass effect from pancreatic mass, however the soft tissue planes adjacent to  the distal stomach are not well-defined on this current exam in the absence enteric contrast. 2. Common bile duct stent in place with pneumobilia. Decreased gallbladder distension from prior exam. Equivocal gallbladder wall thickening but no pericholecystic fat stranding. 3. Known pancreatic head mass is difficult to delineate, however appears decreased in size from prior exam. Periportal adenopathy, with slight increase size of some of the periportal nodes. 4. Colonic diverticulosis without diverticulitis. 5. Attenuated superior mesenteric and splenic veins without thrombus or occlusion. There are left upper quadrant collaterals. Aortic Atherosclerosis (ICD10-I70.0). Electronically Signed   By: Keith Rake M.D.   On: 05/04/2021 20:17    IMPRESSION:  # 60 yo male with newly diagnosed HOP adenocarcinoma , stage IIB. Plan is for neoadjuvant chemotherapy then possible resection.  ERCP 7 /28>> distal CBD stricture, migrated plastic stent which was removed and metal stent placed.  Now presenting with worsening abdominal pain over the past several days with large volume non-bloody emesis.  CT suggestive of GOO, ? From mass effect of the mass vs other source.  LFTs are remaining normal.   # Chronic loose stool (since March 2022) despite pancreatic enzyme replacement. He carries a diagnosis of exocrine pancreatic insufficiency.  --Creon 72K with meals and 36K with snacks when resumes PO   #  ? Cirrhosis based on our office notes.  No evidence for decompensation. INR normal.   # DM, uncontrolled blood sugars.  PLAN: -Needs NGT in place.  Attempt x 4 by nurses were unsuccessful.  IR not available today.  Dr. Ardis Hughs will attempt it this afternoon. -EGD with Dr. Silverio Decamp on 9/6. -Antiemetics prn.   Laban [name]. [name]  05/05/2021, 1:27 PM  ________________________________________________________________________  Velora Heckler GI MD note:  I personally examined the patient, reviewed the data and agree with the  assessment and plan described above.  He has apparent gastric outlet obstruction which is probably due to his known pancreatic cancer.  I was able to place a 12Fr NG tube into his left nostril with fairly quick aspiration of 2.5 liters of dilute gastric secretions, no blood. His abd feels much better. The tube was secured and suction turned to low intermittent.  We are planning on further evaluation of his gastric outlet obstruction by EGD tomorrow with Dr. Silverio Decamp.  Pending that evaluation, stenting may be a reasonable approach.  Owens Loffler, MD [facility] Pager [phone]

## 2021-05-05 NOTE — Progress Notes (Signed)
PROGRESS NOTE  [name] [name] [id number] DOB: Sep 04, 1960   PCP: [name], NP  Patient is from: Home.  DOA: 05/04/2021 LOS: 1  Chief complaints:  Chief Complaint  Patient presents with   Abdominal Pain   Fever     Brief Narrative / Interim history: 60 year old M with history of pancreatic cancer on chemo, biliary stent, DM-2, HTN and tobacco use disorder presenting with cramping abdominal pain, nausea, emesis and distention, and admitted with working diagnosis of gastric outlet obstruction.  GI consulted and recommended NG tube to low intermittent wall suction.   Subjective: Seen and examined earlier this morning.  No major events overnight of this morning.  No further emesis but reflux.  Abdominal pain improved.  He rates the pain 4/10.  Pain is over LUQ.  He describes the pain as sharp.  Denies chest pain or dyspnea.  Denies UTI symptoms.  Objective: Vitals:   05/04/21 2130 05/04/21 2227 05/05/21 0239 05/05/21 0551  BP: 135/71 (!) 151/80 113/66 (!) 152/83  Pulse: 98 89 93 89  Resp: (!) '21 16 18 18  '$ Temp:  99.7 F (37.6 C) 98.2 F (36.8 C) 100.2 F (37.9 C)  TempSrc:  Oral Oral Oral  SpO2: 94% 100% 96% 96%  Weight:  67.8 kg      Intake/Output Summary (Last 24 hours) at 05/05/2021 1327 Last data filed at 05/05/2021 1302 Gross per 24 hour  Intake 1839.24 ml  Output 300 ml  Net 1539.24 ml   Filed Weights   05/04/21 2227  Weight: 67.8 kg    Examination:  GENERAL: No apparent distress.  Nontoxic. HEENT: MMM.  Vision and hearing grossly intact.  NECK: Supple.  No apparent JVD.  RESP: On RA.  No IWOB.  Fair aeration bilaterally. CVS:  RRR. Heart sounds normal.  ABD/GI/GU: BS+. Abd soft.  LUQ tenderness. MSK/EXT:  Moves extremities. No apparent deformity. No edema.  SKIN: no apparent skin lesion or wound NEURO: Awake, alert and oriented appropriately.  No apparent focal neuro deficit. PSYCH: Calm. Normal affect.   Procedures:  None  Microbiology  summarized: [id number] and influenza PCR nonreactive.  Assessment & Plan: Gastric outlet obstruction Nausea/vomiting/abdominal pain-likely due to GER and underlying pancreatic cancer. - GI to see patient this afternoon -NPO, IVF, antiemetics and analgesics -Continue PPI  Pancreatic cancer-stage IIb.  Recent diagnosis and 01/2021. CT negative for mets  Followed by Dr. Lorenso Courier. -Started chemotherapy on 8/11.  Had second cycle on 8/25. -Add oncology to treatment team  Pancreatic insufficiency -We will resume home Creon once he start taking p.o.  Uncontrolled IDDM-2 with hyperglycemia: A1c 9.6% on 8/29.  On Lantus 20 units twice daily and SSI at home. Recent Labs  Lab 05/05/21 0003 05/05/21 0553 05/05/21 1245  GLUCAP 184* 157* 130*  -Continue current insulin regimen while NPO  Essential hypertension: Normotensive for most part. -Continue monitoring -We will resume home antihypertensive meds once able to take p.o. -As needed labetalol with parameters  Hypokalemia/hypomagnesemia: Improved. -Added KCl to IV fluid -IV magnesium sulfate 2 g x 1  Hyponatremia: Likely hypovolemic.  Improved with IV fluid. -Change LR to NS-KCl  Mood disorder: Stable. -Resume home meds  Decreased urine output-reports no urine output since yesterday evening.  Some suprapubic tenderness -Bladder scan -Closely monitor urine output  Tobacco use disorder -Encourage tobacco cessation -Continue nicotine patch   Body mass index is 20.85 kg/m.         DVT prophylaxis:  SCDs Start: 05/04/21 2144  Code Status: Full  code Family Communication: Patient and/or RN. Available if any question.  Level of care: Telemetry Status is: Inpatient  Remains inpatient appropriate because:Persistent severe electrolyte disturbances, Ongoing diagnostic testing needed not appropriate for outpatient work up, IV treatments appropriate due to intensity of illness or inability to take PO, and Inpatient level of  care appropriate due to severity of illness  Dispo: The patient is from: Home              Anticipated d/c is to: Home              Patient currently is not medically stable to d/c.   Difficult to place patient No       Consultants:  Gastroenterology   Sch Meds:  Scheduled Meds:  insulin aspart  0-9 Units Subcutaneous Q6H   insulin glargine-yfgn  7 Units Subcutaneous BID   pantoprazole (PROTONIX) IV  40 mg Intravenous Q24H   Continuous Infusions:  0.9 % NaCl with KCl 20 mEq / L 75 mL/hr at 05/05/21 0936   PRN Meds:.acetaminophen **OR** acetaminophen, HYDROmorphone (DILAUDID) injection, LORazepam, naLOXone (NARCAN)  injection, nicotine, ondansetron (ZOFRAN) IV  Antimicrobials: Anti-infectives (From admission, onward)    None        I have personally reviewed the following labs and images: CBC: Recent Labs  Lab 05/04/21 1801 05/05/21 0434  WBC 10.2 8.3  NEUTROABS 7.1  --   HGB 13.4 12.1*  HCT 38.8* 34.9*  MCV 88.6 88.6  PLT 233 183   BMP &GFR Recent Labs  Lab 05/04/21 1801 05/04/21 2146 05/05/21 0434  NA 126*  --  131*  K 3.0*  --  3.5  CL 85*  --  94*  CO2 28  --  28  GLUCOSE 241*  --  148*  BUN 22*  --  17  CREATININE 0.61  --  0.49*  CALCIUM 9.2  --  9.0  MG  --  1.7 1.5*  PHOS  --   --  3.2   Estimated Creatinine Clearance: 96.5 mL/min (A) (by C-G formula based on SCr of 0.49 mg/dL (L)). Liver & Pancreas: Recent Labs  Lab 05/04/21 1801 05/05/21 0434  AST 21 21  ALT 15 15  ALKPHOS 102 89  BILITOT 1.5* 1.1  PROT 8.2* 7.4  ALBUMIN 3.6 3.2*   Recent Labs  Lab 05/04/21 1801  LIPASE 16   No results for input(s): AMMONIA in the last 168 hours. Diabetic: No results for input(s): HGBA1C in the last 72 hours. Recent Labs  Lab 05/05/21 0003 05/05/21 0553 05/05/21 1245  GLUCAP 184* 157* 130*   Cardiac Enzymes: No results for input(s): CKTOTAL, CKMB, CKMBINDEX, TROPONINI in the last 168 hours. No results for input(s): PROBNP in the  last 8760 hours. Coagulation Profile: No results for input(s): INR, PROTIME in the last 168 hours. Thyroid Function Tests: Recent Labs    05/04/21 2330  TSH 2.672   Lipid Profile: No results for input(s): CHOL, HDL, LDLCALC, TRIG, CHOLHDL, LDLDIRECT in the last 72 hours. Anemia Panel: No results for input(s): VITAMINB12, FOLATE, FERRITIN, TIBC, IRON, RETICCTPCT in the last 72 hours. Urine analysis:    Component Value Date/Time   COLORURINE YELLOW (A) 05/04/2021 1746   APPEARANCEUR CLEAR (A) 05/04/2021 1746   LABSPEC >1.030 (H) 05/04/2021 1746   PHURINE 6.0 05/04/2021 1746   GLUCOSEU 250 (A) 05/04/2021 1746   HGBUR NEGATIVE 05/04/2021 1746   BILIRUBINUR MODERATE (A) 05/04/2021 1746   BILIRUBINUR neg 08/03/2017 0959   KETONESUR 40 (A) 05/04/2021  1746   PROTEINUR 100 (A) 05/04/2021 1746   UROBILINOGEN 0.2 08/03/2017 0959   UROBILINOGEN 0.2 09/28/2013 1520   NITRITE NEGATIVE 05/04/2021 1746   LEUKOCYTESUR NEGATIVE 05/04/2021 1746   Sepsis Labs: Invalid input(s): PROCALCITONIN, [place]  Microbiology: Recent Results (from the past 240 hour(s))  Resp Panel by RT-PCR (Flu A&B, Covid) Nasopharyngeal Swab     Status: None   Collection Time: 05/04/21  9:28 PM   Specimen: Nasopharyngeal Swab; Nasopharyngeal(NP) swabs in vial transport medium  Result Value Ref Range Status   SARS Coronavirus 2 by RT PCR NEGATIVE NEGATIVE Final    Comment: (NOTE) SARS-CoV-2 target nucleic acids are NOT DETECTED.  The SARS-CoV-2 RNA is generally detectable in upper respiratory specimens during the acute phase of infection. The lowest concentration of SARS-CoV-2 viral copies this assay can detect is 138 copies/mL. A negative result does not preclude SARS-Cov-2 infection and should not be used as the sole basis for treatment or other patient management decisions. A negative result may occur with  improper specimen collection/handling, submission of specimen other than nasopharyngeal swab,  presence of viral mutation(s) within the areas targeted by this assay, and inadequate number of viral copies(<138 copies/mL). A negative result must be combined with clinical observations, patient history, and epidemiological information. The expected result is Negative.  Fact Sheet for Patients:  EntrepreneurPulse.com.au  Fact Sheet for Healthcare Providers:  IncredibleEmployment.be  This test is no t yet approved or cleared by the [place] FDA and  has been authorized for detection and/or diagnosis of SARS-CoV-2 by FDA under an Emergency Use Authorization (EUA). This EUA will remain  in effect (meaning this test can be used) for the duration of the COVID-19 declaration under Section 564(b)(1) of the Act, 21 U.S.C.section 360bbb-3(b)(1), unless the authorization is terminated  or revoked sooner.       Influenza A by PCR NEGATIVE NEGATIVE Final   Influenza B by PCR NEGATIVE NEGATIVE Final    Comment: (NOTE) The Xpert Xpress SARS-CoV-2/FLU/RSV plus assay is intended as an aid in the diagnosis of influenza from Nasopharyngeal swab specimens and should not be used as a sole basis for treatment. Nasal washings and aspirates are unacceptable for Xpert Xpress SARS-CoV-2/FLU/RSV testing.  Fact Sheet for Patients: EntrepreneurPulse.com.au  Fact Sheet for Healthcare Providers: IncredibleEmployment.be  This test is not yet approved or cleared by the [place] FDA and has been authorized for detection and/or diagnosis of SARS-CoV-2 by FDA under an Emergency Use Authorization (EUA). This EUA will remain in effect (meaning this test can be used) for the duration of the COVID-19 declaration under Section 564(b)(1) of the Act, 21 U.S.C. section 360bbb-3(b)(1), unless the authorization is terminated or revoked.  Performed at [facility], [place] [address] [place], [place] [address]      Radiology Studies: CT Abdomen Pelvis W Contrast  Result Date: 05/04/2021 CLINICAL DATA:  Diverticulitis suspected abdominal pain Patient reports left lower quadrant pain. Fever. Nausea and vomiting. History of pancreatic cancer, active chemotherapy. EXAM: CT ABDOMEN AND PELVIS WITH CONTRAST TECHNIQUE: Multidetector CT imaging of the abdomen and pelvis was performed using the standard protocol following bolus administration of intravenous contrast. CONTRAST:  33m OMNIPAQUE IOHEXOL 350 MG/ML SOLN COMPARISON:  Most recent CT noncontrast exam 02/26/2021. FINDINGS: Lower chest: Hypoventilatory atelectasis in the right lower lobe. No pulmonary nodule or pleural effusion. Fluid-filled dilated distal esophagus. Normal heart size with coronary artery calcifications. Hepatobiliary: Pneumobilia likely related to intervening ERCP. Common bile duct stent is in place. No intrahepatic  biliary ductal dilatation. Decreased gallbladder distension from prior exam. Equivocal gallbladder wall thickening but no pericholecystic fat stranding. Suspected tiny gallstones. Pancreas: Pancreatic head mass is difficult to delineate on the current exam, however appears decreased in size from prior currently measuring approximately 2.8 x 2.9 cm, series 2, image 40. There is distal pancreatic atrophy and ductal dilatation with pancreatic duct measuring 4 mm. No definite peripancreatic fat stranding or inflammation. Spleen: Normal in size without focal abnormality. Adrenals/Urinary Tract: No adrenal nodule. No hydronephrosis or perinephric edema. Homogeneous renal enhancement with symmetric excretion on delayed phase imaging. Tiny hypodensity in the mid lower right kidney is too small to characterize. Urinary bladder is physiologically distended without wall thickening. Stomach/Bowel: Fluid distends the distal esophagus which is mildly dilated. The stomach is markedly dilated and fluid-filled. There may be mass effect on the gastroduodenal  junction, series 2, image 38, from pancreatic mass, difficult to accurately define bowel in this region due to lack of enteric contrast. Duodenum is difficult to define, however there is fluid within the duodenum which is nondistended. Few fluid-filled small bowel loops without abnormal small bowel distension or inflammation. Appendix not visualized, appendectomy per history. Moderate volume of colonic stool. There is no colonic wall thickening or inflammation. Diverticulosis from the splenic flexure distally. No diverticulitis. Vascular/Lymphatic: Moderate aortic and branch atherosclerosis. Portal caval node measuring 1.2 x 2.3 cm, not significantly changed in size allowing for differences in caliper placement. Periportal node has likely increased from prior, 15 mm short axis series 2, image 26. There are multiple additional prominent nodes in the porta hepatis. No portal vein thrombus. The superior mesenteric vein is attenuated. The splenic vein is attenuated. No evidence of thrombus or occlusion. There are some left upper quadrant collaterals. Reproductive: Prostatic calcifications. Other: No ascites.  No free air.  No omental thickening. Musculoskeletal: Degenerative change in the lumbar spine with primarily facet hypertrophy. No focal bone lesion or acute osseous abnormalities are seen IMPRESSION: 1. Markedly dilated fluid-filled stomach, with fluid distending the distal esophagus. This may be due to mass effect from pancreatic mass, however the soft tissue planes adjacent to the distal stomach are not well-defined on this current exam in the absence enteric contrast. 2. Common bile duct stent in place with pneumobilia. Decreased gallbladder distension from prior exam. Equivocal gallbladder wall thickening but no pericholecystic fat stranding. 3. Known pancreatic head mass is difficult to delineate, however appears decreased in size from prior exam. Periportal adenopathy, with slight increase size of some of  the periportal nodes. 4. Colonic diverticulosis without diverticulitis. 5. Attenuated superior mesenteric and splenic veins without thrombus or occlusion. There are left upper quadrant collaterals. Aortic Atherosclerosis (ICD10-I70.0). Electronically Signed   By: Keith Rake M.D.   On: 05/04/2021 20:17       Agustin Swatek T. No Name  If 7PM-7AM, please contact night-coverage [email or web address] 05/05/2021, 1:27 PM

## 2021-05-06 ENCOUNTER — Other Ambulatory Visit: Payer: Self-pay

## 2021-05-06 ENCOUNTER — Encounter (HOSPITAL_COMMUNITY)
Admission: EM | Disposition: A | Discharge status: Home / Self Care | Payer: Self-pay | Source: Home / Self Care | Attending: Student

## 2021-05-06 ENCOUNTER — Encounter (HOSPITAL_COMMUNITY): Payer: Self-pay | Admitting: Internal Medicine

## 2021-05-06 ENCOUNTER — Inpatient Hospital Stay (HOSPITAL_COMMUNITY): Payer: Medicaid Other | Admitting: Anesthesiology

## 2021-05-06 DIAGNOSIS — T182XXA Foreign body in stomach, initial encounter: Secondary | ICD-10-CM

## 2021-05-06 DIAGNOSIS — K311 Adult hypertrophic pyloric stenosis: Secondary | ICD-10-CM

## 2021-05-06 DIAGNOSIS — R112 Nausea with vomiting, unspecified: Secondary | ICD-10-CM

## 2021-05-06 DIAGNOSIS — K3184 Gastroparesis: Secondary | ICD-10-CM

## 2021-05-06 HISTORY — PX: ESOPHAGOGASTRODUODENOSCOPY (EGD) WITH PROPOFOL: SHX5813

## 2021-05-06 LAB — GLUCOSE, CAPILLARY
Glucose-Capillary: 112 mg/dL — ABNORMAL HIGH (ref 70–99)
Glucose-Capillary: 128 mg/dL — ABNORMAL HIGH (ref 70–99)
Glucose-Capillary: 133 mg/dL — ABNORMAL HIGH (ref 70–99)
Glucose-Capillary: 147 mg/dL — ABNORMAL HIGH (ref 70–99)
Glucose-Capillary: 130 mg/dL — ABNORMAL HIGH (ref 70–99)

## 2021-05-06 LAB — CBC
HCT: 34.8 % — ABNORMAL LOW (ref 39.0–52.0)
Hemoglobin: 11.8 g/dL — ABNORMAL LOW (ref 13.0–17.0)
MCH: 30.6 pg (ref 26.0–34.0)
MCHC: 33.9 g/dL (ref 30.0–36.0)
MCV: 90.2 fL (ref 80.0–100.0)
Platelets: 163 10*3/uL (ref 150–400)
RBC: 3.86 MIL/uL — ABNORMAL LOW (ref 4.22–5.81)
RDW: 13.2 % (ref 11.5–15.5)
WBC: 13.6 10*3/uL — ABNORMAL HIGH (ref 4.0–10.5)
nRBC: 0 % (ref 0.0–0.2)

## 2021-05-06 LAB — PHOSPHORUS: Phosphorus: 2.9 mg/dL (ref 2.5–4.6)

## 2021-05-06 LAB — COMPREHENSIVE METABOLIC PANEL
ALT: 14 U/L (ref 0–44)
AST: 24 U/L (ref 15–41)
Albumin: 2.9 g/dL — ABNORMAL LOW (ref 3.5–5.0)
Alkaline Phosphatase: 84 U/L (ref 38–126)
Anion gap: 12 (ref 5–15)
BUN: 14 mg/dL (ref 6–20)
CO2: 25 mmol/L (ref 22–32)
Calcium: 8.6 mg/dL — ABNORMAL LOW (ref 8.9–10.3)
Chloride: 95 mmol/L — ABNORMAL LOW (ref 98–111)
Creatinine, Ser: 0.47 mg/dL — ABNORMAL LOW (ref 0.61–1.24)
GFR, Estimated: >60 mL/min (ref >60)
Glucose, Bld: 129 mg/dL — ABNORMAL HIGH (ref 70–99)
Potassium: 2.9 mmol/L — ABNORMAL LOW (ref 3.5–5.1)
Sodium: 132 mmol/L — ABNORMAL LOW (ref 135–145)
Total Bilirubin: 1.4 mg/dL — ABNORMAL HIGH (ref 0.3–1.2)
Total Protein: 6.8 g/dL (ref 6.5–8.1)

## 2021-05-06 LAB — MAGNESIUM: Magnesium: 1.8 mg/dL (ref 1.7–2.4)

## 2021-05-06 LAB — BILIRUBIN, DIRECT: Bilirubin, Direct: 0.4 mg/dL — ABNORMAL HIGH (ref 0.0–0.2)

## 2021-05-06 SURGERY — ESOPHAGOGASTRODUODENOSCOPY (EGD) WITH PROPOFOL
Anesthesia: General

## 2021-05-06 MED ORDER — METOCLOPRAMIDE HCL 5 MG/ML IJ SOLN
5.0000 mg | Freq: Four times a day (QID) | INTRAMUSCULAR | Status: DC
  Administered 2021-05-06 – 2021-05-07 (×4): 5 mg via INTRAVENOUS
  Filled 2021-05-06 (×4): qty 2

## 2021-05-06 MED ORDER — MIRTAZAPINE 30 MG PO TBDP
30.0000 mg | ORAL_TABLET | Freq: Once | ORAL | Status: AC
  Administered 2021-05-06: 30 mg via ORAL
  Filled 2021-05-06: qty 1

## 2021-05-06 MED ORDER — CHLORHEXIDINE GLUCONATE CLOTH 2 % EX PADS
6.0000 | MEDICATED_PAD | Freq: Every day | CUTANEOUS | Status: DC
  Administered 2021-05-06 – 2021-05-08 (×3): 6 via TOPICAL

## 2021-05-06 MED ORDER — MAGNESIUM SULFATE 2 GM/50ML IV SOLN
2.0000 g | Freq: Once | INTRAVENOUS | Status: AC
  Administered 2021-05-06: 2 g via INTRAVENOUS
  Filled 2021-05-06: qty 50

## 2021-05-06 MED ORDER — PROPOFOL 10 MG/ML IV BOLUS
INTRAVENOUS | Status: DC | PRN
  Administered 2021-05-06: 150 mg via INTRAVENOUS
  Administered 2021-05-06: 50 mg via INTRAVENOUS

## 2021-05-06 MED ORDER — LIDOCAINE 2% (20 MG/ML) 5 ML SYRINGE
INTRAMUSCULAR | Status: DC | PRN
  Administered 2021-05-06: 60 mg via INTRAVENOUS

## 2021-05-06 MED ORDER — DEXTROSE 5 % IV SOLN: INTRAVENOUS | Status: AC

## 2021-05-06 MED ORDER — SUCCINYLCHOLINE CHLORIDE 200 MG/10ML IV SOSY
PREFILLED_SYRINGE | INTRAVENOUS | Status: DC | PRN
  Administered 2021-05-06: 200 mg via INTRAVENOUS

## 2021-05-06 MED ORDER — SODIUM CHLORIDE 0.9 % IV SOLN: INTRAVENOUS | Status: DC

## 2021-05-06 MED ORDER — ONDANSETRON HCL 4 MG/2ML IJ SOLN
INTRAMUSCULAR | Status: DC | PRN
  Administered 2021-05-06: 4 mg via INTRAVENOUS

## 2021-05-06 MED ORDER — POTASSIUM CHLORIDE IN NACL 40-0.9 MEQ/L-% IV SOLN
INTRAVENOUS | Status: DC
  Filled 2021-05-06 (×6): qty 1000

## 2021-05-06 MED ORDER — PHENYLEPHRINE 40 MCG/ML (10ML) SYRINGE FOR IV PUSH (FOR BLOOD PRESSURE SUPPORT)
PREFILLED_SYRINGE | INTRAVENOUS | Status: DC | PRN
  Administered 2021-05-06 (×2): 80 ug via INTRAVENOUS

## 2021-05-06 MED ORDER — LACTATED RINGERS IV SOLN
INTRAVENOUS | Status: DC
  Administered 2021-05-06: 1000 mL via INTRAVENOUS

## 2021-05-06 MED ORDER — POTASSIUM CHLORIDE 10 MEQ/100ML IV SOLN
10.0000 meq | INTRAVENOUS | Status: AC
Start: 2021-05-06 — End: 2021-05-06
  Administered 2021-05-06 (×4): 10 meq via INTRAVENOUS
  Filled 2021-05-06 (×3): qty 100

## 2021-05-06 SURGICAL SUPPLY — 14 items

## 2021-05-06 NOTE — Op Note (Addendum)
[facility] Patient Name: [name] [name] Procedure Date: 05/06/2021 MRN: [id number] Attending MD: Mauri Pole , MD Date of Birth: 01/11/1961 CSN: [id number] Age: 60 Admit Type: Outpatient Procedure:                Upper GI endoscopy Indications:              Persistent vomiting of unknown cause, Suspected                            stenosis of the duodenum, Abnormal CT of the GI                            tract Providers:                Mauri Pole, MD, Ellin Goodie, RN Referring MD:              Medicines:                Monitored Anesthesia Care Complications:            No immediate complications. Estimated Blood Loss:     Estimated blood loss was minimal. Procedure:                Pre-Anesthesia Assessment:                           - Prior to the procedure, a History and Physical                            was performed, and patient medications and                            allergies were reviewed. The patient's tolerance of                            previous anesthesia was also reviewed. The risks                            and benefits of the procedure and the sedation                            options and risks were discussed with the patient.                            All questions were answered, and informed consent                            was obtained. Prior Anticoagulants: The patient has                            taken no previous anticoagulant or antiplatelet                            agents.  ASA Grade Assessment: III - A patient with                            severe systemic disease. After reviewing the risks                            and benefits, the patient was deemed in                            satisfactory condition to undergo the procedure.                           After obtaining informed consent, the endoscope was                            passed under direct vision.  Throughout the                            procedure, the patient's blood pressure, pulse, and                            oxygen saturations were monitored continuously. The                            [id number] Olympus endoscope was introduced                            through the mouth, and advanced to the second part                            of duodenum. The upper GI endoscopy was technically                            difficult and complex due to presence of food and                            presence of bezoar. Successful completion of the                            procedure was aided by lavage. The patient                            tolerated the procedure well. Scope In: Scope Out: Findings:      LA Grade C (one or more mucosal breaks continuous between tops of 2 or       more mucosal folds, less than 75% circumference) esophagitis with no       bleeding was found 34 to 38 cm from the incisors.      A large amount of food (residue) was found in the gastric fundus and in       the gastric body. Removal of food was accomplished. Pyloric channel       appeared normal with no obvious gastric outlet obstructions.      Moderately congested mucosa without active bleeding and with  no stigmata       of bleeding was found in the first portion of the duodenum and in the       second portion of the duodenum. No fixed stenosis or stricture. No       duodenal ulcer Impression:               - LA Grade C reflux esophagitis with no bleeding.                           - A large amount of food (residue) in the stomach.                            Removal was successful.                           - Congested duodenal mucosa. Moderate Sedation:      Not Applicable - Patient had care per Anesthesia. Recommendation:           - Patient has a contact number available for                            emergencies. The signs and symptoms of potential                            delayed  complications were discussed with the                            patient. Return to normal activities tomorrow.                            Written discharge instructions were provided to the                            patient.                           - Clear liquid diet today, then advance as                            tolerated to gastroparesis diet.                           - Small frequent meals. Avoid high fat and high                            fiber diet                           - Continue present medications.                           - Follow an antireflux regimen.                           - Use Reglan '5mg'$  before meals and at bedtime as  needed                           - Ok to discharge home tomorrow if tolerating diet,                            GI is available if have any questions Procedure Code(s):        --- Professional ---                           [phone], Esophagogastroduodenoscopy, flexible,                            transoral; with removal of foreign body(s) Diagnosis Code(s):        --- Professional ---                           K21.00, Gastro-esophageal reflux disease with                            esophagitis, without bleeding                           T18.2XXA, Foreign body in stomach, initial encounter                           K31.89, Other diseases of stomach and duodenum                           0000000, Cyclical vomiting syndrome unrelated to                            migraine                           R93.3, Abnormal findings on diagnostic imaging of                            other parts of digestive tract CPT copyright 2019 [employer]. All rights reserved. The codes documented in this report are preliminary and upon coder review may  be revised to meet current compliance requirements. Mauri Pole, MD 05/06/2021 2:49:54 PM This report has been signed electronically. Number of Addenda: 0

## 2021-05-06 NOTE — Transfer of Care (Signed)
Immediate Anesthesia Transfer of Care Note  Patient: KAYVIN MOODY  Procedure(s) Performed: ESOPHAGOGASTRODUODENOSCOPY (EGD) WITH PROPOFOL  Patient Location: Endoscopy Unit  Anesthesia Type:General  Level of Consciousness: awake  Airway & Oxygen Therapy: Patient Spontanous Breathing  Post-op Assessment: Report given to RN and Post -op Vital signs reviewed and stable  Post vital signs: Reviewed and stable  Last Vitals:  Vitals Value Taken Time  BP    Temp    Pulse 81 05/06/21 1446  Resp 17 05/06/21 1446  SpO2 98 % 05/06/21 1446  Vitals shown include unvalidated device data.  Last Pain:  Vitals:   05/06/21 1245  TempSrc: Oral  PainSc: 4       Patients Stated Pain Goal: 0 ([id number])  Complications: No notable events documented.

## 2021-05-06 NOTE — Progress Notes (Signed)
PROGRESS NOTE  [name] [name] [id number] DOB: 1961-02-02   PCP: [name], NP  Patient is from: Home.  DOA: 05/04/2021 LOS: 2  Chief complaints:  Chief Complaint  Patient presents with   Abdominal Pain   Fever     Brief Narrative / Interim history: 60 year old M with history of pancreatic cancer on chemo, biliary stent, DM-2, HTN and tobacco use disorder presenting with cramping abdominal pain, nausea, emesis and distention, and admitted with working diagnosis of gastric outlet obstruction.  GI consulted and NG tube inserted with relief of his abdominal pain.  Plan for EGD  Subjective: Seen and examined earlier this morning.  No major events overnight of this morning.  No complaints.  Reports improvement in his abdominal pain after NG tube insertion.  No nausea or vomiting.  No stool or flatus since admission.  Objective: Vitals:   05/06/21 0500 05/06/21 1245 05/06/21 1446 05/06/21 1450  BP:  (!) 163/73 (!) 156/67 (!) 147/64  Pulse:   81 81  Resp:  (!) 23 (!) 21 (!) 21  Temp:  98.1 F (36.7 C) 99.2 F (37.3 C)   TempSrc:  Oral Axillary   SpO2:  99% 97% 95%  Weight: 66.9 kg       Intake/Output Summary (Last 24 hours) at 05/06/2021 1459 Last data filed at 05/06/2021 1425 Gross per 24 hour  Intake 1655.44 ml  Output 6650 ml  Net -4994.56 ml   Filed Weights   05/04/21 2227 05/06/21 0500  Weight: 67.8 kg 66.9 kg    Examination:  GENERAL: No apparent distress.  Nontoxic. HEENT: MMM.  NGT to wall suction. NECK: Supple.  No apparent JVD.  RESP: On RA.  No IWOB.  Fair aeration bilaterally. CVS:  RRR. Heart sounds normal.  ABD/GI/GU: BS+. Abd soft, NTND.  MSK/EXT:  Moves extremities. No apparent deformity. No edema.  SKIN: no apparent skin lesion or wound NEURO: Awake and alert. Oriented appropriately.  No apparent focal neuro deficit. PSYCH: Calm. Normal affect.   Procedures:  9/5-NGT  Microbiology summarized: [id number] and influenza PCR  nonreactive.  Assessment & Plan: Gastric outlet obstruction-likely mass-effect from pancreatic cancer. Nausea/vomiting/abdominal pain-likely due to the above. -GI managing  -NG tube decompression  -NPO, IVF, PPI, antiemetics and analgesics  -EGD later today  Pancreatic cancer-stage IIb.  Recent diagnosis and 01/2021. CT negative for mets  Followed by Dr. Lorenso Courier. -Started chemotherapy on 8/11.  Had second cycle on 8/25. -Add oncology to treatment team  Pancreatic insufficiency -Resume home Creon once he start taking p.o.  Uncontrolled IDDM-2 with hyperglycemia: A1c 9.6% on 8/29.  On Lantus 20 units twice daily and SSI at home. Recent Labs  Lab 05/05/21 1922 05/05/21 2256 05/06/21 0316 05/06/21 0527 05/06/21 1217  GLUCAP 90 95 112* 128* 133*  -Continue D5 current insulin regiment while NPO  Essential hypertension: Normotensive for most part. -Continue monitoring -Discontinue IVF and resume home antihypertensive meds once able to take p.o. -As needed labetalol with parameters  Hypokalemia/hypomagnesemia: Likely from GI loss and IV fluid. -Change IVF to NS-KCl 40 mill equivalent at 125 cc an hour -IV KCl 10 mill equivalent x4 -IV magnesium sulfate 2 g x 1  Hyponatremia: Likely hypovolemic.  Improved with IV fluid. -IVF as above  Mood disorder: Stable. -Resume home meds  Decreased urine output-resolved with IV fluid.  Voiding well. -Monitor output  Tobacco use disorder -Encourage tobacco cessation -Continue nicotine patch  Leukocytosis/bandemia: Likely demargination -Continue monitoring   Body mass index is 20.57 kg/m.  DVT prophylaxis:  SCDs Start: 05/04/21 2144  Code Status: Full code Family Communication: Patient and/or RN. Available if any question.  Level of care: Telemetry Status is: Inpatient  Remains inpatient appropriate because:Persistent severe electrolyte disturbances, Ongoing diagnostic testing needed not appropriate for outpatient  work up, IV treatments appropriate due to intensity of illness or inability to take PO, and Inpatient level of care appropriate due to severity of illness  Dispo: The patient is from: Home              Anticipated d/c is to: Home              Patient currently is not medically stable to d/c.   Difficult to place patient No       Consultants:  Gastroenterology Oncology   Sch Meds:  Scheduled Meds:  [MAR Hold] insulin aspart  0-9 Units Subcutaneous Q6H   [MAR Hold] insulin glargine-yfgn  7 Units Subcutaneous BID   metoCLOPramide (REGLAN) injection  5 mg Intravenous Q6H   [MAR Hold] pantoprazole (PROTONIX) IV  40 mg Intravenous Q24H   Continuous Infusions:  sodium chloride     0.9 % NaCl with KCl 40 mEq / L 125 mL/hr at 05/06/21 1010   dextrose     lactated ringers Stopped (05/06/21 1446)   magnesium sulfate bolus IVPB     PRN Meds:.[MAR Hold] acetaminophen **OR** [MAR Hold] acetaminophen, [MAR Hold]  HYDROmorphone (DILAUDID) injection, [MAR Hold] LORazepam, [MAR Hold] naLOXone (NARCAN)  injection, [MAR Hold] nicotine, [MAR Hold] ondansetron (ZOFRAN) IV  Antimicrobials: Anti-infectives (From admission, onward)    None        I have personally reviewed the following labs and images: CBC: Recent Labs  Lab 05/04/21 1801 05/05/21 0434 05/06/21 0555  WBC 10.2 8.3 13.6*  NEUTROABS 7.1  --   --   HGB 13.4 12.1* 11.8*  HCT 38.8* 34.9* 34.8*  MCV 88.6 88.6 90.2  PLT 233 183 163   BMP &GFR Recent Labs  Lab 05/04/21 1801 05/04/21 2146 05/05/21 0434 05/06/21 0555  NA 126*  --  131* 132*  K 3.0*  --  3.5 2.9*  CL 85*  --  94* 95*  CO2 28  --  28 25  GLUCOSE 241*  --  148* 129*  BUN 22*  --  17 14  CREATININE 0.61  --  0.49* 0.47*  CALCIUM 9.2  --  9.0 8.6*  MG  --  1.7 1.5* 1.8  PHOS  --   --  3.2 2.9   Estimated Creatinine Clearance: 95.2 mL/min (A) (by C-G formula based on SCr of 0.47 mg/dL (L)). Liver & Pancreas: Recent Labs  Lab 05/04/21 1801  05/05/21 0434 05/06/21 0555  AST '21 21 24  '$ ALT '15 15 14  '$ ALKPHOS 102 89 84  BILITOT 1.5* 1.1 1.4*  PROT 8.2* 7.4 6.8  ALBUMIN 3.6 3.2* 2.9*   Recent Labs  Lab 05/04/21 1801  LIPASE 16   No results for input(s): AMMONIA in the last 168 hours. Diabetic: No results for input(s): HGBA1C in the last 72 hours. Recent Labs  Lab 05/05/21 1922 05/05/21 2256 05/06/21 0316 05/06/21 0527 05/06/21 1217  GLUCAP 90 95 112* 128* 133*   Cardiac Enzymes: No results for input(s): CKTOTAL, CKMB, CKMBINDEX, TROPONINI in the last 168 hours. No results for input(s): PROBNP in the last 8760 hours. Coagulation Profile: No results for input(s): INR, PROTIME in the last 168 hours. Thyroid Function Tests: Recent Labs    05/04/21 2330  TSH 2.672   Lipid Profile: No results for input(s): CHOL, HDL, LDLCALC, TRIG, CHOLHDL, LDLDIRECT in the last 72 hours. Anemia Panel: No results for input(s): VITAMINB12, FOLATE, FERRITIN, TIBC, IRON, RETICCTPCT in the last 72 hours. Urine analysis:    Component Value Date/Time   COLORURINE YELLOW (A) 05/04/2021 1746   APPEARANCEUR CLEAR (A) 05/04/2021 1746   LABSPEC >1.030 (H) 05/04/2021 1746   PHURINE 6.0 05/04/2021 1746   GLUCOSEU 250 (A) 05/04/2021 1746   HGBUR NEGATIVE 05/04/2021 1746   BILIRUBINUR MODERATE (A) 05/04/2021 1746   BILIRUBINUR neg 08/03/2017 0959   KETONESUR 40 (A) 05/04/2021 1746   PROTEINUR 100 (A) 05/04/2021 1746   UROBILINOGEN 0.2 08/03/2017 0959   UROBILINOGEN 0.2 09/28/2013 1520   NITRITE NEGATIVE 05/04/2021 1746   LEUKOCYTESUR NEGATIVE 05/04/2021 1746   Sepsis Labs: Invalid input(s): PROCALCITONIN, [place]  Microbiology: Recent Results (from the past 240 hour(s))  Culture, blood (routine x 2)     Status: None (Preliminary result)   Collection Time: 05/04/21  7:19 PM   Specimen: BLOOD  Result Value Ref Range Status   Specimen Description   Final    BLOOD LEFT ANTECUBITAL Performed at [facility] [address] [place], [place] [address]    Special Requests   Final    BOTTLES DRAWN AEROBIC AND ANAEROBIC Blood Culture results may not be optimal due to an excessive volume of blood received in culture bottles Performed at [facility] [address] [place], [place] [address]    Culture   Final    NO GROWTH 1 DAY Performed at [facility], [place] [address] [place], [place] [address]    Report Status PENDING  Incomplete  Culture, blood (routine x 2)     Status: None (Preliminary result)   Collection Time: 05/04/21  7:19 PM   Specimen: BLOOD  Result Value Ref Range Status   Specimen Description   Final    BLOOD BLOOD LEFT HAND Performed at [facility] [address], [place], [place] [address]    Special Requests   Final    BOTTLES DRAWN AEROBIC AND ANAEROBIC Blood Culture adequate volume Performed at [facility] [address] [place], [place] [address]    Culture   Final    NO GROWTH 1 DAY Performed at [facility], [place] [address] [place], [place] [address]    Report Status PENDING  Incomplete  Resp Panel by RT-PCR (Flu A&B, Covid) Nasopharyngeal Swab     Status: None   Collection Time: 05/04/21  9:28 PM   Specimen: Nasopharyngeal Swab; Nasopharyngeal(NP) swabs in vial transport medium  Result Value Ref Range Status   SARS Coronavirus 2 by RT PCR NEGATIVE NEGATIVE Final    Comment: (NOTE) SARS-CoV-2 target nucleic acids are NOT DETECTED.  The SARS-CoV-2 RNA is generally detectable in upper respiratory specimens during the acute phase of infection. The lowest concentration of SARS-CoV-2 viral copies this assay can detect is 138 copies/mL. A negative result does not preclude SARS-Cov-2 infection and should not be used as the sole basis for treatment or other patient management decisions. A negative result may occur with  improper specimen collection/handling, submission of specimen other than  nasopharyngeal swab, presence of viral mutation(s) within the areas targeted by this assay, and inadequate number of viral copies(<138 copies/mL). A negative result must be combined with clinical observations, patient history, and epidemiological information. The expected result is Negative.  Fact Sheet for  Patients:  EntrepreneurPulse.com.au  Fact Sheet for Healthcare Providers:  IncredibleEmployment.be  This test is no t yet approved or cleared by the [place] FDA and  has been authorized for detection and/or diagnosis of SARS-CoV-2 by FDA under an Emergency Use Authorization (EUA). This EUA will remain  in effect (meaning this test can be used) for the duration of the COVID-19 declaration under Section 564(b)(1) of the Act, 21 U.S.C.section 360bbb-3(b)(1), unless the authorization is terminated  or revoked sooner.       Influenza A by PCR NEGATIVE NEGATIVE Final   Influenza B by PCR NEGATIVE NEGATIVE Final    Comment: (NOTE) The Xpert Xpress SARS-CoV-2/FLU/RSV plus assay is intended as an aid in the diagnosis of influenza from Nasopharyngeal swab specimens and should not be used as a sole basis for treatment. Nasal washings and aspirates are unacceptable for Xpert Xpress SARS-CoV-2/FLU/RSV testing.  Fact Sheet for Patients: EntrepreneurPulse.com.au  Fact Sheet for Healthcare Providers: IncredibleEmployment.be  This test is not yet approved or cleared by the [place] FDA and has been authorized for detection and/or diagnosis of SARS-CoV-2 by FDA under an Emergency Use Authorization (EUA). This EUA will remain in effect (meaning this test can be used) for the duration of the COVID-19 declaration under Section 564(b)(1) of the Act, 21 U.S.C. section 360bbb-3(b)(1), unless the authorization is terminated or revoked.  Performed at [facility], [place] [address], [place], [place] [address]     Radiology Studies: No results found.     Ezrah Panning T. [place]  If 7PM-7AM, please contact night-coverage [email or web address] 05/06/2021, 2:59 PM

## 2021-05-06 NOTE — Anesthesia Procedure Notes (Signed)
Procedure Name: Intubation Date/Time: 05/06/2021 2:03 PM Performed by: Talbot Grumbling, CRNA Pre-anesthesia Checklist: Patient identified, Emergency Drugs available, Suction available and Patient being monitored Patient Re-evaluated:Patient Re-evaluated prior to induction Oxygen Delivery Method: Circle system utilized Preoxygenation: Pre-oxygenation with 100% oxygen Induction Type: IV induction, Rapid sequence and Cricoid Pressure applied Laryngoscope Size: Mac and 3 Grade View: Grade I Tube type: Oral Tube size: 7.5 mm Number of attempts: 1 Airway Equipment and Method: Stylet Placement Confirmation: ETT inserted through vocal cords under direct vision, positive ETCO2 and breath sounds checked- equal and bilateral Secured at: 23 cm Tube secured with: Tape Dental Injury: Teeth and Oropharynx as per pre-operative assessment

## 2021-05-06 NOTE — Progress Notes (Addendum)
[name] Progress Note  CC:   Suspected GOO, known pancreatic cancer  Subjective: He continues to have intermittent LUQ pain, not severe. He is hungry. He remains NPO for EGD this afternoon. No gas per the rectum or BM overnight or thus far this morning. No N/V with NGT to LIS. No CP, palpitations or SOB. Mother at the bedside.    Objective:   CTAP with contrast 05/05/2021: 1. Markedly dilated fluid-filled stomach, with fluid distending the distal esophagus. This may be due to mass effect from pancreatic mass, however the soft tissue planes adjacent to the distal stomach are not well-defined on this current exam in the absence enteric contrast. 2. Common bile duct stent in place with pneumobilia. Decreased gallbladder distension from prior exam. Equivocal gallbladder wall thickening but no pericholecystic fat stranding. 3. Known pancreatic head mass is difficult to delineate, however appears decreased in size from prior exam. Periportal adenopathy, with slight increase size of some of the periportal nodes. 4. Colonic diverticulosis without diverticulitis. 5. Attenuated superior mesenteric and splenic veins without thrombus or occlusion. There are left upper quadrant collaterals.  Aortic Atherosclerosis   Vital signs in last 24 hours: Temp:  [98.2 F (36.8 C)-100.4 F (38 C)] 98.5 F (36.9 C) (09/06 0322) Pulse Rate:  [85-93] 85 (09/06 0322) Resp:  [18] 18 (09/05 2057) BP: (134-138)/(73-80) 138/73 (09/06 0322) SpO2:  [93 %-96 %] 96 % (09/06 0322) Weight:  [66.9 kg] 66.9 kg (09/06 0500) Last BM Date: 05/03/21 (per patient)  NGT output: 1200cc from midnight to 0600 today. Since then 300cc in container.  NGT drainage is gray/brown grainy liquid.  General:   Alert 60 year old male in NAD.  Eyes: No scleral icterus.  Heart: RRR, no murmur.  Pulm:  Breath sounds clear throughout.  Abdomen: Soft, nondistended. Nontender. + BS x 4 quads.  Extremities:  Lower  extremities without edema. Neurologic:  Alert and  oriented x 4 Grossly normal neurologically. Psych:  Alert and cooperative. Normal mood and affect.  Intake/Output from previous day: 09/05 0701 - 09/06 0700 In: 1255.4 [I.V.:1255.4] Out: 6250 [Urine:1000; Emesis/NG output:5250] Intake/Output this shift: No intake/output data recorded.  Lab Results: Recent Labs    05/04/21 1801 05/05/21 0434 05/06/21 0555  WBC 10.2 8.3 13.6*  HGB 13.4 12.1* 11.8*  HCT 38.8* 34.9* 34.8*  PLT 233 183 163   BMET Recent Labs    05/04/21 1801 05/05/21 0434 05/06/21 0555  NA 126* 131* 132*  K 3.0* 3.5 2.9*  CL 85* 94* 95*  CO2 '28 28 25  ' GLUCOSE 241* 148* 129*  BUN 22* 17 14  CREATININE 0.61 0.49* 0.47*  CALCIUM 9.2 9.0 8.6*   LFT Recent Labs    05/06/21 0555  PROT 6.8  ALBUMIN 2.9*  AST 24  ALT 14  ALKPHOS 84  BILITOT 1.4*   PT/INR No results for input(s): LABPROT, INR in the last 72 hours. Hepatitis Panel No results for input(s): HEPBSAG, HCVAB, HEPAIGM, HEPBIGM in the last 72 hours.  CT Abdomen Pelvis W Contrast  Result Date: 05/04/2021 CLINICAL DATA:  Diverticulitis suspected abdominal pain Patient reports left lower quadrant pain. Fever. Nausea and vomiting. History of pancreatic cancer, active chemotherapy. EXAM: CT ABDOMEN AND PELVIS WITH CONTRAST TECHNIQUE: Multidetector CT imaging of the abdomen and pelvis was performed using the standard protocol following bolus administration of intravenous contrast. CONTRAST:  93m OMNIPAQUE IOHEXOL 350 MG/ML SOLN COMPARISON:  Most recent CT noncontrast exam 02/26/2021. FINDINGS: Lower chest: Hypoventilatory atelectasis in  the right lower lobe. No pulmonary nodule or pleural effusion. Fluid-filled dilated distal esophagus. Normal heart size with coronary artery calcifications. Hepatobiliary: Pneumobilia likely related to intervening ERCP. Common bile duct stent is in place. No intrahepatic biliary ductal dilatation. Decreased gallbladder  distension from prior exam. Equivocal gallbladder wall thickening but no pericholecystic fat stranding. Suspected tiny gallstones. Pancreas: Pancreatic head mass is difficult to delineate on the current exam, however appears decreased in size from prior currently measuring approximately 2.8 x 2.9 cm, series 2, image 40. There is distal pancreatic atrophy and ductal dilatation with pancreatic duct measuring 4 mm. No definite peripancreatic fat stranding or inflammation. Spleen: Normal in size without focal abnormality. Adrenals/Urinary Tract: No adrenal nodule. No hydronephrosis or perinephric edema. Homogeneous renal enhancement with symmetric excretion on delayed phase imaging. Tiny hypodensity in the mid lower right kidney is too small to characterize. Urinary bladder is physiologically distended without wall thickening. Stomach/Bowel: Fluid distends the distal esophagus which is mildly dilated. The stomach is markedly dilated and fluid-filled. There may be mass effect on the gastroduodenal junction, series 2, image 38, from pancreatic mass, difficult to accurately define bowel in this region due to lack of enteric contrast. Duodenum is difficult to define, however there is fluid within the duodenum which is nondistended. Few fluid-filled small bowel loops without abnormal small bowel distension or inflammation. Appendix not visualized, appendectomy per history. Moderate volume of colonic stool. There is no colonic wall thickening or inflammation. Diverticulosis from the splenic flexure distally. No diverticulitis. Vascular/Lymphatic: Moderate aortic and branch atherosclerosis. Portal caval node measuring 1.2 x 2.3 cm, not significantly changed in size allowing for differences in caliper placement. Periportal node has likely increased from prior, 15 mm short axis series 2, image 26. There are multiple additional prominent nodes in the porta hepatis. No portal vein thrombus. The superior mesenteric vein is  attenuated. The splenic vein is attenuated. No evidence of thrombus or occlusion. There are some left upper quadrant collaterals. Reproductive: Prostatic calcifications. Other: No ascites.  No free air.  No omental thickening. Musculoskeletal: Degenerative change in the lumbar spine with primarily facet hypertrophy. No focal bone lesion or acute osseous abnormalities are seen IMPRESSION: 1. Markedly dilated fluid-filled stomach, with fluid distending the distal esophagus. This may be due to mass effect from pancreatic mass, however the soft tissue planes adjacent to the distal stomach are not well-defined on this current exam in the absence enteric contrast. 2. Common bile duct stent in place with pneumobilia. Decreased gallbladder distension from prior exam. Equivocal gallbladder wall thickening but no pericholecystic fat stranding. 3. Known pancreatic head mass is difficult to delineate, however appears decreased in size from prior exam. Periportal adenopathy, with slight increase size of some of the periportal nodes. 4. Colonic diverticulosis without diverticulitis. 5. Attenuated superior mesenteric and splenic veins without thrombus or occlusion. There are left upper quadrant collaterals. Aortic Atherosclerosis (ICD10-I70.0). Electronically Signed   By: Keith Rake M.D.   On: 05/04/2021 20:17    Assessment / Plan:  1) 60 yo male with newly diagnosed HOP adenocarcinoma , stage IIB. Plan is for neoadjuvant chemotherapy then possible resection.  ERCP 7 /28/2022 identified a distal CBD stricture, migrated plastic stent which was removed and metal stent placed.  Admitted to the hospital 9/5 with worsening abdominal pain over the past several days with large volume non-bloody emesis.  CTAP identified markedly dilated fluid filled stomach with fluid distending the the distal esophagus suggestive of GOO, ? from mass effect of the mass  vs other source.  NGT to LIS with high volume output. Elevated T. Bili 1.4.  Normal Alk phos and AST/ALT levels.  -NPO -Proceed with EGD this afternoon as scheduled with Dr. Silverio Decamp  -Continue Pantoprazole 51m IV Q 24hrs -Ondansetron 464mIV Q 6 hrs PRN -Add direct bili to prior am lab draw  -Pain management per the hospitalist    2) Chronic loose stool (since March 2022) despite pancreatic enzyme replacement. He carries a diagnosis of exocrine pancreatic insufficiency.  -Creon 72K with meals and 36K with snacks when resumes PO   3) History of chronic hepatitis C treated and cured. Query cirrhosis per past Fibroscan. No evidence for decompensation. INR normal.  4) Alcohol use disorder, abstinent from alcohol for at least 10 months   5) DM, uncontrolled blood sugars  6) Hyponatremia. Na+ 132   Hypokalemia. K + 2.9. KCL IV ordered per the hospitalist    Principal Problem:   Gastric outlet obstruction Active Problems:   Diabetes type 2, controlled (HCC)   Tobacco use disorder   Acute hyponatremia   Hypokalemia   Nausea & vomiting   Abdominal pain   Dehydration     LOS: 2 days   CoPatrecia Pourennedy-[name]  05/06/2021, 0956 AM   Attending physician's note   I have taken an interval history, reviewed the chart and examined the patient. I agree with the Advanced Practitioner's note, impression and recommendations.   [id number] M with pancreatic Ca currently receiving neo adjuvant chemo cycle 3/8 with plan for subsequent resection admitted with gastric outlet obstruction Will proceed with EGD to evaluate  The risks and benefits as well as alternatives of endoscopic procedure(s) have been discussed and reviewed. All questions answered. The patient agrees to proceed.  The patient was provided an opportunity to ask questions and all were answered. The patient agreed with the plan and demonstrated an understanding of the instructions.  K.Damaris Hippo MD [phone]

## 2021-05-06 NOTE — Anesthesia Preprocedure Evaluation (Addendum)
Anesthesia Evaluation  Patient identified by MRN, date of birth, ID band Patient awake    Reviewed: Allergy & Precautions, NPO status , Patient's Chart, lab work & pertinent test results  History of Anesthesia Complications Negative for: history of anesthetic complications  Airway Mallampati: II  TM Distance: >3 FB Neck ROM: Full    Dental  (+) Missing, Poor Dentition, Chipped   Pulmonary Current Smoker and Patient abstained from smoking.,    Pulmonary exam normal        Cardiovascular hypertension, Normal cardiovascular exam     Neuro/Psych negative neurological ROS  negative psych ROS   GI/Hepatic hiatal hernia, GERD  ,(+) Cirrhosis     substance abuse (in remission)  alcohol use, Hepatitis -, CPancreatic cancer on chemo Gastric outlet obstruction   Endo/Other  diabetes, Poorly Controlled, Type 2, Insulin Dependent  Renal/GU negative Renal ROS  negative genitourinary   Musculoskeletal negative musculoskeletal ROS (+)   Abdominal   Peds  Hematology negative hematology ROS (+)   Anesthesia Other Findings  Echo 10/30/20: EF 60-65%, mild LVH, normal RV function, valves unremarkable   Reproductive/Obstetrics                            Anesthesia Physical  Anesthesia Plan  ASA: 3 and emergent  Anesthesia Plan: General   Post-op Pain Management:    Induction: Intravenous, Rapid sequence and Cricoid pressure planned  PONV Risk Score and Plan: 1 and Ondansetron, Dexamethasone, Midazolam and Treatment may vary due to age or medical condition  Airway Management Planned: Oral ETT  Additional Equipment: None  Intra-op Plan:   Post-operative Plan: Extubation in OR  Informed Consent: I have reviewed the patients History and Physical, chart, labs and discussed the procedure including the risks, benefits and alternatives for the proposed anesthesia with the patient or authorized  representative who has indicated his/her understanding and acceptance.     Dental advisory given  Plan Discussed with:   Anesthesia Plan Comments:         Anesthesia Quick Evaluation

## 2021-05-07 ENCOUNTER — Encounter (HOSPITAL_COMMUNITY): Payer: Self-pay | Admitting: Gastroenterology

## 2021-05-07 ENCOUNTER — Inpatient Hospital Stay (HOSPITAL_COMMUNITY): Payer: Medicaid Other

## 2021-05-07 ENCOUNTER — Other Ambulatory Visit: Payer: Self-pay

## 2021-05-07 DIAGNOSIS — R1084 Generalized abdominal pain: Secondary | ICD-10-CM

## 2021-05-07 DIAGNOSIS — R140 Abdominal distension (gaseous): Secondary | ICD-10-CM

## 2021-05-07 DIAGNOSIS — K3184 Gastroparesis: Secondary | ICD-10-CM

## 2021-05-07 LAB — CBC
HCT: 35.6 % — ABNORMAL LOW (ref 39.0–52.0)
Hemoglobin: 12 g/dL — ABNORMAL LOW (ref 13.0–17.0)
MCH: 30.7 pg (ref 26.0–34.0)
MCHC: 33.7 g/dL (ref 30.0–36.0)
MCV: 91 fL (ref 80.0–100.0)
Platelets: 174 10*3/uL (ref 150–400)
RBC: 3.91 MIL/uL — ABNORMAL LOW (ref 4.22–5.81)
RDW: 13.3 % (ref 11.5–15.5)
WBC: 10.2 10*3/uL (ref 4.0–10.5)
nRBC: 0 % (ref 0.0–0.2)

## 2021-05-07 LAB — RENAL FUNCTION PANEL
Albumin: 3.2 g/dL — ABNORMAL LOW (ref 3.5–5.0)
Anion gap: 12 (ref 5–15)
BUN: 11 mg/dL (ref 6–20)
CO2: 22 mmol/L (ref 22–32)
Calcium: 8.4 mg/dL — ABNORMAL LOW (ref 8.9–10.3)
Chloride: 97 mmol/L — ABNORMAL LOW (ref 98–111)
Creatinine, Ser: 0.43 mg/dL — ABNORMAL LOW (ref 0.61–1.24)
GFR, Estimated: >60 mL/min (ref >60)
Glucose, Bld: 168 mg/dL — ABNORMAL HIGH (ref 70–99)
Phosphorus: 2.3 mg/dL — ABNORMAL LOW (ref 2.5–4.6)
Potassium: 3.7 mmol/L (ref 3.5–5.1)
Sodium: 131 mmol/L — ABNORMAL LOW (ref 135–145)

## 2021-05-07 LAB — GLUCOSE, CAPILLARY
Glucose-Capillary: 148 mg/dL — ABNORMAL HIGH (ref 70–99)
Glucose-Capillary: 176 mg/dL — ABNORMAL HIGH (ref 70–99)
Glucose-Capillary: 183 mg/dL — ABNORMAL HIGH (ref 70–99)
Glucose-Capillary: 200 mg/dL — ABNORMAL HIGH (ref 70–99)

## 2021-05-07 LAB — MAGNESIUM: Magnesium: 1.9 mg/dL (ref 1.7–2.4)

## 2021-05-07 MED ORDER — POLYETHYLENE GLYCOL 3350 17 G PO PACK
17.0000 g | PACK | Freq: Every day | ORAL | Status: DC
  Administered 2021-05-07: 17 g via ORAL
  Filled 2021-05-07: qty 1

## 2021-05-07 MED ORDER — METOCLOPRAMIDE HCL 5 MG/ML IJ SOLN
5.0000 mg | Freq: Four times a day (QID) | INTRAMUSCULAR | Status: DC
  Administered 2021-05-07: 5 mg via INTRAVENOUS
  Filled 2021-05-07: qty 2

## 2021-05-07 MED ORDER — METOCLOPRAMIDE HCL 5 MG/ML IJ SOLN
10.0000 mg | Freq: Four times a day (QID) | INTRAMUSCULAR | Status: DC
  Administered 2021-05-07 – 2021-05-08 (×4): 10 mg via INTRAVENOUS
  Filled 2021-05-07 (×4): qty 2

## 2021-05-07 MED ORDER — MIRTAZAPINE 30 MG PO TBDP
30.0000 mg | ORAL_TABLET | Freq: Once | ORAL | Status: AC
  Administered 2021-05-07: 30 mg via ORAL
  Filled 2021-05-07: qty 1

## 2021-05-07 MED ORDER — MORPHINE SULFATE (PF) 2 MG/ML IV SOLN
1.0000 mg | INTRAVENOUS | Status: DC | PRN
  Administered 2021-05-07 – 2021-05-08 (×4): 1 mg via INTRAVENOUS
  Filled 2021-05-07 (×4): qty 1

## 2021-05-07 MED ORDER — BISACODYL 10 MG RE SUPP
10.0000 mg | Freq: Every day | RECTAL | Status: DC
  Filled 2021-05-07: qty 1

## 2021-05-07 NOTE — Progress Notes (Signed)
PROGRESS NOTE    [name] [name]  [id number] DOB: April 21, 1961 DOA: 05/04/2021 PCP: [name], NP    Chief Complaint  Patient presents with   Abdominal Pain   Fever    Brief Narrative:  60 year old M with history of pancreatic cancer on chemo, biliary stent, DM-2, HTN and tobacco use disorder presenting with cramping abdominal pain, nausea, emesis and distention, and admitted with working diagnosis of gastric outlet obstruction.  GI consulted and NG tube inserted with relief of his abdominal pain.  He underwent EGD on May 06, 2021.  Post procedure he was on liquid diet at which time he had Jell-O and ginger ale and chicken broth after which he had severe pain in the epigastric area.  He reports having passed 3 small bowel movements. Assessment & Plan:   Principal Problem:   Gastric outlet obstruction Active Problems:   Diabetes type 2, controlled (HCC)   Tobacco use disorder   Acute hyponatremia   Hypokalemia   Nausea & vomiting   Abdominal pain   Dehydration   Gastroparesis   Pancreatic adenocarcinoma stage IIb s/p chemotherapy Further evaluation as per his oncologist.   Gastric outlet obstruction S/p NGT S/p EGD on 9/6 showing grade C esophagitis without bleeding and large amount of food residue in the stomach without evidence of gastric outlet obstruction. Probably secondary to mass-effect from pancreatic mass contributing to the abdominal pain. Patient on IV Reglan, increase the dose to 10 mg every 6 hours, continue with PPI for 24 hours Repeat abdominal film in the morning.    Chronic hepatitis C Completed treatment, no evidence of decompensation.   Diabetes mellitus uncontrolled blood sugars at this time Continue with sliding scale insulin.    Hyponatremia sodium of 131 which has improved from 126    Anemia of chronic disease/normocytic anemia Hemoglobin around 12.   Alcohol abuse Patient reports he has not had alcohol for the last 1  year.      DVT prophylaxis: SCD'S Code Status: FULL CODE.  Family Communication: none at bedside.  Disposition:   Status is: Inpatient  Remains inpatient appropriate because:Ongoing diagnostic testing needed not appropriate for outpatient work up, Unsafe d/c plan, and IV treatments appropriate due to intensity of illness or inability to take PO  Dispo: The patient is from: Home              Anticipated d/c is to: Home              Patient currently is not medically stable to d/c.   Difficult to place patient No       Consultants:  Gastroenterology.   Procedures: EGD on 05/06/21  Antimicrobials: none.    Subjective: Abdominal pain after eating.   Objective: Vitals:   05/06/21 2315 05/07/21 0500 05/07/21 0510 05/07/21 1356  BP: 135/86  (!) 143/75 (!) 150/87  Pulse: 81  70 81  Resp: 20  20 (!) 23  Temp: 98.1 F (36.7 C)  98.1 F (36.7 C) 98.8 F (37.1 C)  TempSrc: Oral  Oral Oral  SpO2: 98%  100% 100%  Weight:  66.7 kg      Intake/Output Summary (Last 24 hours) at 05/07/2021 1552 Last data filed at 05/07/2021 1100 Gross per 24 hour  Intake 1288.26 ml  Output 200 ml  Net 1088.26 ml   Filed Weights   05/04/21 2227 05/06/21 0500 05/07/21 0500  Weight: 67.8 kg 66.9 kg 66.7 kg    Examination:  General exam: Appears calm  and comfortable  Respiratory system: Clear to auscultation. Respiratory effort normal. Cardiovascular system: S1 & S2 heard, RRR. No JVD,No pedal edema. Gastrointestinal system: Abdomen is nondistended, soft and nontender.  Normal bowel sounds heard. Central nervous system: Alert and oriented. No focal neurological deficits. Extremities: Symmetric 5 x 5 power. Skin: No rashes, lesions or ulcers Psychiatry:  Mood & affect appropriate.     Data Reviewed: I have personally reviewed following labs and imaging studies  CBC: Recent Labs  Lab 05/04/21 1801 05/05/21 0434 05/06/21 0555 05/07/21 0500  WBC 10.2 8.3 13.6* 10.2  NEUTROABS 7.1   --   --   --   HGB 13.4 12.1* 11.8* 12.0*  HCT 38.8* 34.9* 34.8* 35.6*  MCV 88.6 88.6 90.2 91.0  PLT 233 183 163 [id number]    Basic Metabolic Panel: Recent Labs  Lab 05/04/21 1801 05/04/21 2146 05/05/21 0434 05/06/21 0555 05/07/21 0500  NA 126*  --  131* 132* 131*  K 3.0*  --  3.5 2.9* 3.7  CL 85*  --  94* 95* 97*  CO2 28  --  '28 25 22  '$ GLUCOSE 241*  --  148* 129* 168*  BUN 22*  --  '17 14 11  '$ CREATININE 0.61  --  0.49* 0.47* 0.43*  CALCIUM 9.2  --  9.0 8.6* 8.4*  MG  --  1.7 1.5* 1.8 1.9  PHOS  --   --  3.2 2.9 2.3*    GFR: Estimated Creatinine Clearance: 95 mL/min (A) (by C-G formula based on SCr of 0.43 mg/dL (L)).  Liver Function Tests: Recent Labs  Lab 05/04/21 1801 05/05/21 0434 05/06/21 0555 05/07/21 0500  AST '21 21 24  '$ --   ALT '15 15 14  '$ --   ALKPHOS 102 89 84  --   BILITOT 1.5* 1.1 1.4*  --   PROT 8.2* 7.4 6.8  --   ALBUMIN 3.6 3.2* 2.9* 3.2*    CBG: Recent Labs  Lab 05/06/21 1217 05/06/21 1730 05/06/21 2316 05/07/21 0512 05/07/21 1210  GLUCAP 133* 147* 130* 148* 200*     Recent Results (from the past 240 hour(s))  Culture, blood (routine x 2)     Status: None (Preliminary result)   Collection Time: 05/04/21  7:19 PM   Specimen: BLOOD  Result Value Ref Range Status   Specimen Description   Final    BLOOD LEFT ANTECUBITAL Performed at [facility], [place] [address], [place], [place] [address]    Special Requests   Final    BOTTLES DRAWN AEROBIC AND ANAEROBIC Blood Culture results may not be optimal due to an excessive volume of blood received in culture bottles Performed at [place] [address], [place], [place] [address]    Culture   Final    NO GROWTH 2 DAYS Performed at [place] [address] [place], [place] [address]    Report Status PENDING  Incomplete  Culture, blood (routine x 2)     Status: None (Preliminary result)   Collection Time: 05/04/21  7:19 PM   Specimen: BLOOD   Result Value Ref Range Status   Specimen Description   Final    BLOOD BLOOD LEFT HAND Performed at [place] [address] [place], [place] [address]    Special Requests   Final    BOTTLES DRAWN AEROBIC AND ANAEROBIC Blood Culture adequate volume Performed at [place] [address] [place],  [address]  Culture   Final    NO GROWTH 2 DAYS Performed at [facility], [place] [address], [place], [place] [address]    Report Status PENDING  Incomplete  Resp Panel by RT-PCR (Flu A&B, Covid) Nasopharyngeal Swab     Status: None   Collection Time: 05/04/21  9:28 PM   Specimen: Nasopharyngeal Swab; Nasopharyngeal(NP) swabs in vial transport medium  Result Value Ref Range Status   SARS Coronavirus 2 by RT PCR NEGATIVE NEGATIVE Final    Comment: (NOTE) SARS-CoV-2 target nucleic acids are NOT DETECTED.  The SARS-CoV-2 RNA is generally detectable in upper respiratory specimens during the acute phase of infection. The lowest concentration of SARS-CoV-2 viral copies this assay can detect is 138 copies/mL. A negative result does not preclude SARS-Cov-2 infection and should not be used as the sole basis for treatment or other patient management decisions. A negative result may occur with  improper specimen collection/handling, submission of specimen other than nasopharyngeal swab, presence of viral mutation(s) within the areas targeted by this assay, and inadequate number of viral copies(<138 copies/mL). A negative result must be combined with clinical observations, patient history, and epidemiological information. The expected result is Negative.  Fact Sheet for Patients:  EntrepreneurPulse.com.au  Fact Sheet for Healthcare Providers:  IncredibleEmployment.be  This test is no t yet approved or cleared by the [place] FDA and  has been authorized for detection and/or diagnosis of  SARS-CoV-2 by FDA under an Emergency Use Authorization (EUA). This EUA will remain  in effect (meaning this test can be used) for the duration of the COVID-19 declaration under Section 564(b)(1) of the Act, 21 U.S.C.section 360bbb-3(b)(1), unless the authorization is terminated  or revoked sooner.       Influenza A by PCR NEGATIVE NEGATIVE Final   Influenza B by PCR NEGATIVE NEGATIVE Final    Comment: (NOTE) The Xpert Xpress SARS-CoV-2/FLU/RSV plus assay is intended as an aid in the diagnosis of influenza from Nasopharyngeal swab specimens and should not be used as a sole basis for treatment. Nasal washings and aspirates are unacceptable for Xpert Xpress SARS-CoV-2/FLU/RSV testing.  Fact Sheet for Patients: EntrepreneurPulse.com.au  Fact Sheet for Healthcare Providers: IncredibleEmployment.be  This test is not yet approved or cleared by the [place] FDA and has been authorized for detection and/or diagnosis of SARS-CoV-2 by FDA under an Emergency Use Authorization (EUA). This EUA will remain in effect (meaning this test can be used) for the duration of the COVID-19 declaration under Section 564(b)(1) of the Act, 21 U.S.C. section 360bbb-3(b)(1), unless the authorization is terminated or revoked.  Performed at [facility], [place] [address] [place], [place] [address]          Radiology Studies: DG Abd 1 View  Result Date: 05/07/2021 CLINICAL DATA:  Left lower quadrant abdominal pain EXAM: ABDOMEN - 1 VIEW COMPARISON:  CT abdomen/pelvis 03/28/2021 FINDINGS: There is a nonobstructive bowel gas pattern. There is no gross organomegaly or abnormal soft tissue calcification. A biliary stent is noted. There is associated pneumobilia projecting over the liver. There is no acute osseous abnormality. IMPRESSION: No acute abnormality identified. Electronically Signed   By: Valetta Mole M.D.   On: 05/07/2021 14:38         Scheduled Meds:  bisacodyl  10 mg Rectal QHS   Chlorhexidine Gluconate Cloth  6 each Topical Daily   insulin aspart  0-9 Units Subcutaneous Q6H   insulin glargine-yfgn  7 Units Subcutaneous BID   metoCLOPramide (REGLAN) injection  10 mg Intravenous Q6H   pantoprazole (PROTONIX) IV  40 mg Intravenous Q24H   polyethylene glycol  17 g Oral QHS   Continuous Infusions:  0.9 % NaCl with KCl 40 mEq / L 125 mL/hr at 05/07/21 1458   lactated ringers Stopped (05/06/21 1446)     LOS: 3 days        Hosie Poisson, MD [facility]   To contact the attending provider between 7A-7P or the covering provider during after hours 7P-7A, please log into the web site [email or web address] and access using universal [place] password for that web site. If you do not have the password, please call the hospital operator.  05/07/2021, 3:52 PM

## 2021-05-07 NOTE — Anesthesia Postprocedure Evaluation (Signed)
Anesthesia Post Note  Patient: [name] [name]  Procedure(s) Performed: ESOPHAGOGASTRODUODENOSCOPY (EGD) WITH PROPOFOL     Anesthesia Type: General Anesthetic complications: no   No notable events documented.  Last Vitals:  Vitals:   05/07/21 0510 05/07/21 1356  BP: (!) 143/75 (!) 150/87  Pulse: 70 81  Resp: 20 (!) 23  Temp: 36.7 C 37.1 C  SpO2: 100% 100%    Last Pain:  Vitals:   05/07/21 1525  TempSrc:   PainSc: 6    Pain Goal: Patients Stated Pain Goal: 0 (05/06/21 1245)                 Brockton Mckesson

## 2021-05-07 NOTE — Progress Notes (Addendum)
[facility] Progress Note  CC:  Suspected GOO, known pancreatic cancer  Subjective: He consumed 5 servings of beef/chicken broth, 2 hot teas, 4 jello cups, 2 ginger ales and ice water yesterday evening following his EGD. A few hours later he developed worsening LUQ pain which radiated to the left flank area which persisted overnight. This am he ate bacon which resulted in worsening central upper and LUQ pain. No N/V. Receiving Reglan 57m IV Q 6 hrs and Morphine 122mQ 4 hrs PRN. He is having increased acid reflux, spitting up clear phlegm. He passed 3 small BMs this morning. No rectal bleeding or black stools. Mother at the bedside.   Objective:   S/P EGD 05/06/2021: LA Grade C (one or more mucosal breaks continuous between tops of 2 or more mucosal folds, less than 75% circumference) esophagitis with no bleeding was found 34 to 38 cm from the incisors. Findings: A large amount of food (residue) was found in the gastric fundus and in the gastric body. Removal of food was accomplished. Pyloric channel appeared normal with no obvious gastric outlet obstructions. Moderately congested mucosa without active bleeding and with no stigmata of bleeding was found in the first portion of the duodenum and in the second portion of the duodenum. No fixed stenosis or stricture. No duodenal ulcer  CTAP with contrast 05/05/2021: 1. Markedly dilated fluid-filled stomach, with fluid distending the distal esophagus. This may be due to mass effect from pancreatic mass, however the soft tissue planes adjacent to the distal stomach are not well-defined on this current exam in the absence enteric contrast. 2. Common bile duct stent in place with pneumobilia. Decreased gallbladder distension from prior exam. Equivocal gallbladder wall thickening but no pericholecystic fat stranding. 3. Known pancreatic head mass is difficult to delineate, however appears decreased in size from prior exam.  Periportal adenopathy, with slight increase size of some of the periportal nodes. 4. Colonic diverticulosis without diverticulitis. 5. Attenuated superior mesenteric and splenic veins without thrombus or occlusion. There are left upper quadrant collaterals.  Aortic Atherosclerosis  Vital signs in last 24 hours: Temp:  [98.1 F (36.7 C)-99.2 F (37.3 C)] 98.1 F (36.7 C) (09/07 0510) Pulse Rate:  [70-81] 70 (09/07 0510) Resp:  [17-23] 20 (09/07 0510) BP: (135-163)/(64-86) 143/75 (09/07 0510) SpO2:  [93 %-100 %] 100 % (09/07 0510) Weight:  [66.7 kg] 66.7 kg (09/07 0500) Last BM Date: 05/07/21 General:   Alert 60ear old male in NAD. Heart: RRR, no murmur. a Pulm: Breath sounds clear.  Abdomen: Mildly distended. Epigastric and LUQ tenderness radiates towards left flank area without rebound or guarding.  Extremities:  Without edema. Neurologic:  Alert and  oriented x4;  Grossly normal neurologically. Psych:  Alert and cooperative. Normal mood and affect.  Intake/Output from previous day: 09/06 0701 - 09/07 0700 In: 1448.3 [P.O.:480; I.V.:968.3] Out: 900 [Urine:400; Emesis/NG output:500] Intake/Output this shift: No intake/output data recorded.  Lab Results: Recent Labs    05/05/21 0434 05/06/21 0555 05/07/21 0500  WBC 8.3 13.6* 10.2  HGB 12.1* 11.8* 12.0*  HCT 34.9* 34.8* 35.6*  PLT 183 163 174   BMET Recent Labs    05/05/21 0434 05/06/21 0555 05/07/21 0500  NA 131* 132* 131*  K 3.5 2.9* 3.7  CL 94* 95* 97*  CO2 '28 25 22  ' GLUCOSE 148* 129* 168*  BUN '17 14 11  ' CREATININE 0.49* 0.47* 0.43*  CALCIUM 9.0 8.6* 8.4*   LFT Recent Labs  05/06/21 0555 05/06/21 0949 05/07/21 0500  PROT 6.8  --   --   ALBUMIN 2.9*  --  3.2*  AST 24  --   --   ALT 14  --   --   ALKPHOS 84  --   --   BILITOT 1.4*  --   --   BILIDIR  --  0.4*  --    PT/INR No results for input(s): LABPROT, INR in the last 72 hours. Hepatitis Panel No results for input(s): HEPBSAG, HCVAB,  HEPAIGM, HEPBIGM in the last 72 hours.  No results found.  Assessment / Plan:  1) 60 yo male with newly diagnosed HOP adenocarcinoma stage IIB. Plan is for neoadjuvant chemotherapy then possible resection (patient was to start chemo per Dr. Lorenso Courier 05/08/2021). ERCP 03/27/2021 identified a distal CBD stricture, migrated plastic stent which was removed and metal stent placed.  Admitted to the hospital 9/5 with worsening abdominal pain over the past several days with large volume non-bloody emesis. CTAP identified markedly dilated fluid filled stomach with fluid distending the the distal esophagus suggestive of GOO, ? from mass effect. NGT to LIS. T. Bili 1.4. Normal Alk phos and AST/ALT levels on 9/6. S/P EGD 9/6 showed grade C esophagitis without bleeding and a large amount of food residue in the stomach without evidence of a GOO. NGT dc'd during EGD. He developed worsening epigastric/LUQ pain after consuming a large volume of clear liquids yesterday evening and after eating bacon for breakfast this am. No N/V. Suspect pancreatic mass effect contributing to ongoing abdominal pain. Pain meds likely resulting in delayed gastric emptying as well.  -Increase Reglan 59m IV Q 6 hours for the next 24 hours -Continue Pantoprazole 431mIV Q 24 hours -CBC and CMP in am -Full liquid diet -Abdominal xray 2 view in am -NGT placement deferred for now, may need to be reinserted if N/V or worsening abdominal distension develops  2) Constipation (previously had chronic loose stools secondary to pancreatic insufficiency) -Miralax Q HS -Dulcolax 10 mg PR Q HS x 3 night -Ambulate in hall as tolerated   3) History of chronic hepatitis C treated and cured. Query cirrhosis per past Fibroscan. No evidence for decompensation. INR normal.   4) Alcohol use disorder, abstinent from alcohol for at least 10 months   5) DM, uncontrolled blood sugars   6) Hyponatremia.  Na+ 131.   7) Normocytic anemia. Stable Hg 12.0.       Principal Problem:   Gastric outlet obstruction Active Problems:   Diabetes type 2, controlled (HCC)   Tobacco use disorder   Acute hyponatremia   Hypokalemia   Nausea & vomiting   Abdominal pain   Dehydration   Gastroparesis     LOS: 3 days   CoNoralyn Pick9/02/2021, 10:41 AM    Attending physician's note   I have taken an interval history, reviewed the chart and examined the patient. I agree with the Advanced Practitioner's note, impression and recommendations.    No evidence of significant stenosis at pyloric channel or duodenum.  He does have congested mucosa in duodenal bulb near the pancreatic lesion Retained gastric content secondary to gastroparesis, diabetes+ chronic narcotic use for pain management  Discussed small frequent meals and avoiding high-fat/high-fiber diet Continue full liquid diet as tolerated Will increase Reglan to 10 mg IV every 6 hours  Constipation opiate induced: Use Dulcolax suppository and MiraLAX 1 capful daily, will titrate up the dose based on response to have 1-2 soft  bowel movements daily  If he has recurrent vomiting or persistent abdominal discomfort, will consider placement of NG tube to decompress the stomach   I have spent 35 minutes of patient care (this includes precharting, chart review, review of results, face-to-face time used for counseling as well as treatment plan and follow-up. The patient was provided an opportunity to ask questions and all were answered. The patient agreed with the plan and demonstrated an understanding of the instructions.  Damaris Hippo , MD [phone]

## 2021-05-08 ENCOUNTER — Inpatient Hospital Stay: Payer: Medicaid Other | Admitting: Hematology and Oncology

## 2021-05-08 ENCOUNTER — Other Ambulatory Visit: Payer: Self-pay

## 2021-05-08 ENCOUNTER — Encounter: Payer: Self-pay | Admitting: Hematology and Oncology

## 2021-05-08 ENCOUNTER — Inpatient Hospital Stay: Payer: Medicaid Other | Admitting: Nutrition

## 2021-05-08 ENCOUNTER — Inpatient Hospital Stay: Payer: Medicaid Other

## 2021-05-08 ENCOUNTER — Inpatient Hospital Stay (HOSPITAL_COMMUNITY): Payer: Medicaid Other

## 2021-05-08 DIAGNOSIS — R1013 Epigastric pain: Secondary | ICD-10-CM

## 2021-05-08 DIAGNOSIS — K5901 Slow transit constipation: Secondary | ICD-10-CM

## 2021-05-08 LAB — BASIC METABOLIC PANEL
Anion gap: 8 (ref 5–15)
BUN: 7 mg/dL (ref 6–20)
CO2: 25 mmol/L (ref 22–32)
Calcium: 9 mg/dL (ref 8.9–10.3)
Chloride: 107 mmol/L (ref 98–111)
Creatinine, Ser: 0.41 mg/dL — ABNORMAL LOW (ref 0.61–1.24)
GFR, Estimated: >60 mL/min (ref >60)
Glucose, Bld: 140 mg/dL — ABNORMAL HIGH (ref 70–99)
Potassium: 3.8 mmol/L (ref 3.5–5.1)
Sodium: 140 mmol/L (ref 135–145)

## 2021-05-08 LAB — GLUCOSE, CAPILLARY
Glucose-Capillary: 136 mg/dL — ABNORMAL HIGH (ref 70–99)
Glucose-Capillary: 275 mg/dL — ABNORMAL HIGH (ref 70–99)

## 2021-05-08 MED ORDER — METOCLOPRAMIDE HCL 5 MG PO TABS
5.0000 mg | ORAL_TABLET | Freq: Three times a day (TID) | ORAL | 1 refills | Status: DC
  Filled 2021-05-08: qty 90, 23d supply, fill #0
  Filled 2021-06-06: qty 90, 23d supply, fill #1

## 2021-05-08 MED ORDER — HEPARIN SOD (PORK) LOCK FLUSH 100 UNIT/ML IV SOLN
500.0000 [IU] | INTRAVENOUS | Status: DC | PRN
  Filled 2021-05-08: qty 5

## 2021-05-08 MED ORDER — POLYETHYLENE GLYCOL 3350 17 GM/SCOOP PO POWD
17.0000 g | Freq: Every day | ORAL | 0 refills | Status: DC
  Filled 2021-05-08 – 2021-05-18 (×3): qty 14, 14d supply, fill #0
  Filled 2021-06-06: qty 238, 14d supply, fill #0

## 2021-05-08 NOTE — Progress Notes (Addendum)
Progress Note Hospital Day: 5  Chief Complaint:    nausea, vomiting    Attending physician's note   I have taken an interval history, reviewed the chart and examined the patient. I agree with the Advanced Practitioner's note, impression and recommendations.   Abdominal pain has improved.  No vomiting  He is tolerating full liquid diet He wants to go home this afternoon. Please discharge on bowel regimen, daily MiraLAX to have 1-2 soft bowel movements.  Limit use of narcotics to decrease narcotic induced constipation and gastroparesis  Small frequent meals Avoid high-fiber and high fat diet Use Reglan 5 mg before meals and at bedtime  History of pancreatic insufficiency: Use Creon with meals and snacks  Follow-up in GI office as needed   I have spent 25 minutes of patient care (this includes precharting, chart review, review of results, face-to-face time used for counseling as well as treatment plan and follow-up. The patient was provided an opportunity to ask questions and all were answered. The patient agreed with the plan and demonstrated an understanding of the instructions.  Damaris Hippo , MD [phone]     ASSESSMENT AND PLAN   Brief History:  60 yo male with pancreatic cancer undergoing chemotherapy, DM2 and hypertension.  Admitted 05/05/2019 today with suspected gastric outlet obstruction with abdominal pain, nausea and vomiting.  CT scan showed a markedly dilated fluid-filled stomach .   # Nausea, vomiting, gastric distention on CT scan. EGD this admission showed a large amount of food in stomach ( removed). Pyloric channel appeared normal, no obstructive process found. May have delayed gastric emptying from opioids --Yesterday he had worsening abdominal pain and eventual nausea / vomiting after consuming a large amount of liquids.  We increased Reglan dose. He had  abdominal films done earlier today, results pending. He feels much better today other than mild  acid regurgitation. He wants to go home --Today's labs at baseline --Will need reglan 5 mg ac and QHS upon discharge  --Small frequent meals. Advanced diet slowly upon discharge --Follow up with our office prn  # Esophagitis, not surprising with delayed gastric emptying predisposing him to GERD.  --Continue daily PPI --Anti-reflux measures discussed   # Constipation, typically has chronic loose stool with history of EPI --Creon 72K with meals and 36K with snacks --Opioids likely causing acute constipation. He is having some small BMs today. Recommend daily Miralax at home ( especially if needing pain medication)  # ? Cirrhosis. No evidence for decompensation.    DIAGNOSTIC STUDIES  05/06/21 EGD Findings: A large amount of food (residue) was found in the gastric fundus and in the gastric body. Removal of food was accomplished. Pyloric channel appeared normal with no obvious gastric outlet obstructions. Moderately congested mucosa without active bleeding and with no stigmata of bleeding was found in the first portion of the duodenum and in the second portion of the duodenum. No fixed stenosis or stricture. No duodenal ulcer - LA Grade C reflux esophagitis with no bleeding. - A large amount of food (residue) in the stomach. Removal was successful. - Congested duodenal mucosa.   SUBJECTIVE    Feels better today. No abdominal pain. No N/V. Wants to go home  OBJECTIVE      Scheduled inpatient medications:   bisacodyl  10 mg Rectal QHS   Chlorhexidine Gluconate Cloth  6 each Topical Daily   insulin aspart  0-9 Units Subcutaneous Q6H   insulin glargine-yfgn  7 Units Subcutaneous BID  metoCLOPramide (REGLAN) injection  10 mg Intravenous Q6H   pantoprazole (PROTONIX) IV  40 mg Intravenous Q24H   polyethylene glycol  17 g Oral QHS   Continuous inpatient infusions:   0.9 % NaCl with KCl 40 mEq / L 125 mL/hr at 05/07/21 1458   lactated ringers Stopped (05/06/21 1446)   PRN  inpatient medications: acetaminophen **OR** acetaminophen, LORazepam, morphine injection, naLOXone (NARCAN)  injection, nicotine, ondansetron (ZOFRAN) IV  Vital signs in last 24 hours: Temp:  [98.4 F (36.9 C)-98.8 F (37.1 C)] 98.5 F (36.9 C) (09/08 0453) Pulse Rate:  [80-81] 80 (09/08 0453) Resp:  [16-23] 16 (09/08 0453) BP: (145-150)/(71-87) 148/85 (09/08 0453) SpO2:  [96 %-100 %] 97 % (09/08 0453) Weight:  [66.9 kg] 66.9 kg (09/08 0453) Last BM Date: 05/07/21 No intake or output data in the 24 hours ending 05/08/21 1130   Physical Exam:  General: Alert male in NAD Heart:  Regular rate and rhythm. No lower extremity edema Pulmonary: Normal respiratory effort Abdomen: Soft, minimally distended, a few bowel sounds. Nontender. Normal bowel sounds.  Neurologic: Alert and oriented Psych: Pleasant. Cooperative.   Filed Weights   05/06/21 0500 05/07/21 0500 05/08/21 0453  Weight: 66.9 kg 66.7 kg 66.9 kg    Intake/Output from previous day: 09/07 0701 - 09/08 0700 In: 240 [P.O.:240] Out: -  Intake/Output this shift: No intake/output data recorded.    Lab Results: Recent Labs    05/06/21 0555 05/07/21 0500  WBC 13.6* 10.2  HGB 11.8* 12.0*  HCT 34.8* 35.6*  PLT 163 174   BMET Recent Labs    05/06/21 0555 05/07/21 0500 05/08/21 0517  NA 132* 131* 140  K 2.9* 3.7 3.8  CL 95* 97* 107  CO2 '25 22 25  '$ GLUCOSE 129* 168* 140*  BUN '14 11 7  '$ CREATININE 0.47* 0.43* 0.41*  CALCIUM 8.6* 8.4* 9.0   LFT Recent Labs    05/06/21 0555 05/06/21 0949 05/07/21 0500  PROT 6.8  --   --   ALBUMIN 2.9*  --  3.2*  AST 24  --   --   ALT 14  --   --   ALKPHOS 84  --   --   BILITOT 1.4*  --   --   BILIDIR  --  0.4*  --    PT/INR No results for input(s): LABPROT, INR in the last 72 hours. Hepatitis Panel No results for input(s): HEPBSAG, HCVAB, HEPAIGM, HEPBIGM in the last 72 hours.  DG Abd 1 View  Result Date: 05/07/2021 CLINICAL DATA:  Left lower quadrant abdominal  pain EXAM: ABDOMEN - 1 VIEW COMPARISON:  CT abdomen/pelvis 03/28/2021 FINDINGS: There is a nonobstructive bowel gas pattern. There is no gross organomegaly or abnormal soft tissue calcification. A biliary stent is noted. There is associated pneumobilia projecting over the liver. There is no acute osseous abnormality. IMPRESSION: No acute abnormality identified. Electronically Signed   By: Valetta Mole M.D.   On: 05/07/2021 14:38        Principal Problem:   Gastric outlet obstruction Active Problems:   Diabetes type 2, controlled (HCC)   Tobacco use disorder   Acute hyponatremia   Hypokalemia   Nausea & vomiting   Abdominal pain   Dehydration   Gastroparesis   Abdominal distension     LOS: 4 days   Tye Savoy ,NP 05/08/2021, 11:30 AM

## 2021-05-09 ENCOUNTER — Telehealth: Payer: Self-pay

## 2021-05-09 ENCOUNTER — Encounter: Payer: Self-pay | Admitting: Hematology and Oncology

## 2021-05-09 ENCOUNTER — Other Ambulatory Visit: Payer: Self-pay | Admitting: Pharmacist

## 2021-05-09 ENCOUNTER — Other Ambulatory Visit: Payer: Self-pay

## 2021-05-09 MED ORDER — LANTUS SOLOSTAR 100 UNIT/ML ~~LOC~~ SOPN
PEN_INJECTOR | SUBCUTANEOUS | 2 refills | Status: DC
  Filled 2021-05-09: qty 15, 33d supply, fill #0

## 2021-05-09 NOTE — Telephone Encounter (Signed)
Transition Care Management Unsuccessful Follow-up Telephone Call  Date of discharge and from where:  [facility] on 05/08/2021  Attempts:  1st Attempt  Reason for unsuccessful TCM follow-up call:  Left voice message unable to reach pt at [phone] to call back.   Need to schedule appt with PCP.

## 2021-05-09 NOTE — Discharge Summary (Signed)
Physician Discharge Summary  [name] [name] [id number] DOB: September 15, 1960 DOA: 05/04/2021  PCP: [name], NP  Admit date: 05/04/2021 Discharge date: 05/08/2021  Admitted From: Home.  Disposition:  Home.   Recommendations for Outpatient Follow-up:  Follow up with PCP in 1-2 weeks Please obtain BMP/CBC in one week Please follow up with gastroenterology as needed.   Discharge Condition:stable.  CODE STATUS:full code.  Diet recommendation: Heart Healthy   Brief/Interim Summary:  60 year old M with history of pancreatic cancer on chemo, biliary stent, DM-2, HTN and tobacco use disorder presenting with cramping abdominal pain, nausea, emesis and distention, and admitted with working diagnosis of gastric outlet obstruction.  GI consulted and NG tube inserted with relief of his abdominal pain.  He underwent EGD on May 06, 2021.  Post procedure he was on liquid diet at which time he had Jell-O and ginger ale and chicken broth after which he had severe pain in the epigastric area.  He reports having passed 3 small bowel movements.  Discharge Diagnoses:  Principal Problem:   Gastric outlet obstruction Active Problems:   Diabetes type 2, controlled (HCC)   Tobacco use disorder   Acute hyponatremia   Hypokalemia   Nausea & vomiting   Abdominal pain   Dehydration   Gastroparesis   Abdominal distension  Pancreatic adenocarcinoma stage IIb s/p chemotherapy Further evaluation as per his oncologist.     Gastric outlet obstruction S/p NGT S/p EGD on 9/6 showing grade C esophagitis without bleeding and large amount of food residue in the stomach without evidence of gastric outlet obstruction. Probably secondary to mass-effect from pancreatic mass contributing to the abdominal pain. Pt able to tolerate diet without any issues.  Recommended, small multiple meals with reglan on discharge.        Chronic hepatitis C Completed treatment, no evidence of decompensation.      Diabetes mellitus  type 2, insulin dependent.  Resume home meds on discharge.        Hyponatremia sodium of 131 which has improved from 126       Anemia of chronic disease/normocytic anemia Hemoglobin around 12.     Alcohol abuse Patient reports he has not had alcohol for the last 1 year.          Discharge Instructions  Discharge Instructions     Diet - low sodium heart healthy   Complete by: As directed    Discharge instructions   Complete by: As directed    Please follow up with gastroenterology as needed.      Allergies as of 05/08/2021       Reactions   Bee Venom Anaphylaxis   Lactose Intolerance (gi) Other (See Comments)   GI upset        Medication List     STOP taking these medications    lidocaine-diphenhydrAMINE-alum & mag hydroxide-simeth-distilled water in nystatin suspension   oxyCODONE 5 MG immediate release tablet Commonly known as: Oxy IR/ROXICODONE       TAKE these medications    albuterol 108 (90 Base) MCG/ACT inhaler Commonly known as: Ventolin HFA Inhale 2 puffs into the lungs every 6 (six) hours as needed for wheezing or shortness of breath.   atorvastatin 40 MG tablet Commonly known as: LIPITOR Take 40 mg by mouth daily.   diphenoxylate-atropine 2.5-0.025 MG tablet Commonly known as: Lomotil Take 1 tablet by mouth 4 (four) times daily as needed for diarrhea or loose stools.   DULoxetine 60 MG capsule Commonly known as: CYMBALTA  Take 1 capsule (60 mg total) by mouth daily.   EPINEPHrine 0.3 mg/0.3 mL Soaj injection Commonly known as: EPI-PEN Inject 0.3 mg into the muscle as needed for anaphylaxis.   folic acid 1 MG tablet Commonly known as: FOLVITE Take 1 tablet (1 mg total) by mouth in the morning.   HumaLOG KwikPen 100 UNIT/ML KwikPen Generic drug: insulin lispro Inject 0-9 Units into the skin 3 (three) times daily. Sliding scale CBG 70 - 120: 0 units CBG 121 - 150: 1 unit,  CBG 151 - 200: 2 units,  CBG 201 -  250: 3 units,  CBG 251 - 300: 5 units,  CBG 301 - 350: 7 units,  CBG 351 - 400: 9 units   CBG > 400: 9 units and notify your MD What changed: Another medication with the same name was removed. Continue taking this medication, and follow the directions you see here.   insulin glargine 100 UNIT/ML injection Commonly known as: LANTUS Inject 0.2 mLs (20 Units total) into the skin 2 (two) times daily. What changed:  how much to take when to take this additional instructions   lidocaine-prilocaine cream Commonly known as: EMLA Apply 1 application topically as directed as needed. What changed:  reasons to take this Another medication with the same name was removed. Continue taking this medication, and follow the directions you see here.   lipase/protease/amylase 36000 UNITS Cpep capsule Commonly known as: CREON Take 2 capsules (72,000 Units total) by mouth 3 (three) times daily with meals. May also take 1 capsule (36,000 Units total) as needed (with snacks). NEEDS PASS.   losartan 50 MG tablet Commonly known as: COZAAR Take 1 tablet (50 mg total) by mouth daily.   magic mouthwash w/lidocaine Soln Take 5 mLs by mouth 4 (four) times daily as needed. What changed: reasons to take this   metoCLOPramide 5 MG tablet Commonly known as: Reglan Take 1 tablet (5 mg total) by mouth 4 (four) times daily -  before meals and at bedtime.   mirtazapine 30 MG tablet Commonly known as: REMERON Take 1 tablet (30 mg total) by mouth at bedtime.   omeprazole 40 MG capsule Commonly known as: PRILOSEC Take 1 capsule (40 mg total) by mouth in the morning.   ondansetron 8 MG tablet Commonly known as: Zofran Take 1 tablet (8 mg total) by mouth 2 (two) times daily as needed. Start on day 3 after chemotherapy. What changed: reasons to take this   polyethylene glycol 17 g packet Commonly known as: MIRALAX / GLYCOLAX Take 17 g by mouth at bedtime.   prochlorperazine 10 MG tablet Commonly known as:  COMPAZINE Take 1 tablet (10 mg total) by mouth every 6 (six) hours as needed (Nausea or vomiting). What changed: Another medication with the same name was removed. Continue taking this medication, and follow the directions you see here.   Therems-M Tabs Take 1 tablet by mouth in the morning.   True Metrix Blood Glucose Test test strip Generic drug: glucose blood Use as instructed. Check blood glucose level by fingerstick 3-4 times per day.  E11.65   TRUEplus Lancets 28G Misc Use as instructed. Check blood glucose level by fingerstick 3-4 times per day.   E11.65   TRUEplus Pen Needles 32G X 4 MM Misc Generic drug: Insulin Pen Needle Use as instructed. Inject into the skin twice daily   TechLite Pen Needles 32G X 4 MM Misc Generic drug: Insulin Pen Needle USE AS INSTRUCTED INTO THE SKIN TWICE DAILY  Follow-up Information     [name], NP. Schedule an appointment as soon as possible for a visit in 1 week(s).   Specialty: Nurse Practitioner Contact information: [place] [address] [phone]                Allergies  Allergen Reactions   Bee Venom Anaphylaxis   Lactose Intolerance (Gi) Other (See Comments)    GI upset    Consultations: Gastroenterology.    Procedures/Studies: DG Abd 1 View  Result Date: 05/07/2021 CLINICAL DATA:  Left lower quadrant abdominal pain EXAM: ABDOMEN - 1 VIEW COMPARISON:  CT abdomen/pelvis 03/28/2021 FINDINGS: There is a nonobstructive bowel gas pattern. There is no gross organomegaly or abnormal soft tissue calcification. A biliary stent is noted. There is associated pneumobilia projecting over the liver. There is no acute osseous abnormality. IMPRESSION: No acute abnormality identified. Electronically Signed   By: Valetta Mole M.D.   On: 05/07/2021 14:38   CT Abdomen Pelvis W Contrast  Result Date: 05/04/2021 CLINICAL DATA:  Diverticulitis suspected abdominal pain Patient reports left lower  quadrant pain. Fever. Nausea and vomiting. History of pancreatic cancer, active chemotherapy. EXAM: CT ABDOMEN AND PELVIS WITH CONTRAST TECHNIQUE: Multidetector CT imaging of the abdomen and pelvis was performed using the standard protocol following bolus administration of intravenous contrast. CONTRAST:  105m OMNIPAQUE IOHEXOL 350 MG/ML SOLN COMPARISON:  Most recent CT noncontrast exam 02/26/2021. FINDINGS: Lower chest: Hypoventilatory atelectasis in the right lower lobe. No pulmonary nodule or pleural effusion. Fluid-filled dilated distal esophagus. Normal heart size with coronary artery calcifications. Hepatobiliary: Pneumobilia likely related to intervening ERCP. Common bile duct stent is in place. No intrahepatic biliary ductal dilatation. Decreased gallbladder distension from prior exam. Equivocal gallbladder wall thickening but no pericholecystic fat stranding. Suspected tiny gallstones. Pancreas: Pancreatic head mass is difficult to delineate on the current exam, however appears decreased in size from prior currently measuring approximately 2.8 x 2.9 cm, series 2, image 40. There is distal pancreatic atrophy and ductal dilatation with pancreatic duct measuring 4 mm. No definite peripancreatic fat stranding or inflammation. Spleen: Normal in size without focal abnormality. Adrenals/Urinary Tract: No adrenal nodule. No hydronephrosis or perinephric edema. Homogeneous renal enhancement with symmetric excretion on delayed phase imaging. Tiny hypodensity in the mid lower right kidney is too small to characterize. Urinary bladder is physiologically distended without wall thickening. Stomach/Bowel: Fluid distends the distal esophagus which is mildly dilated. The stomach is markedly dilated and fluid-filled. There may be mass effect on the gastroduodenal junction, series 2, image 38, from pancreatic mass, difficult to accurately define bowel in this region due to lack of enteric contrast. Duodenum is difficult to  define, however there is fluid within the duodenum which is nondistended. Few fluid-filled small bowel loops without abnormal small bowel distension or inflammation. Appendix not visualized, appendectomy per history. Moderate volume of colonic stool. There is no colonic wall thickening or inflammation. Diverticulosis from the splenic flexure distally. No diverticulitis. Vascular/Lymphatic: Moderate aortic and branch atherosclerosis. Portal caval node measuring 1.2 x 2.3 cm, not significantly changed in size allowing for differences in caliper placement. Periportal node has likely increased from prior, 15 mm short axis series 2, image 26. There are multiple additional prominent nodes in the porta hepatis. No portal vein thrombus. The superior mesenteric vein is attenuated. The splenic vein is attenuated. No evidence of thrombus or occlusion. There are some left upper quadrant collaterals. Reproductive: Prostatic calcifications. Other: No ascites.  No free air.  No  omental thickening. Musculoskeletal: Degenerative change in the lumbar spine with primarily facet hypertrophy. No focal bone lesion or acute osseous abnormalities are seen IMPRESSION: 1. Markedly dilated fluid-filled stomach, with fluid distending the distal esophagus. This may be due to mass effect from pancreatic mass, however the soft tissue planes adjacent to the distal stomach are not well-defined on this current exam in the absence enteric contrast. 2. Common bile duct stent in place with pneumobilia. Decreased gallbladder distension from prior exam. Equivocal gallbladder wall thickening but no pericholecystic fat stranding. 3. Known pancreatic head mass is difficult to delineate, however appears decreased in size from prior exam. Periportal adenopathy, with slight increase size of some of the periportal nodes. 4. Colonic diverticulosis without diverticulitis. 5. Attenuated superior mesenteric and splenic veins without thrombus or occlusion. There are  left upper quadrant collaterals. Aortic Atherosclerosis (ICD10-I70.0). Electronically Signed   By: Keith Rake M.D.   On: 05/04/2021 20:17   DG Abd 2 Views  Result Date: 05/08/2021 CLINICAL DATA:  Abdominal distention. EXAM: ABDOMEN - 2 VIEW COMPARISON:  Radiograph earlier today.  CT 05/04/2021 FINDINGS: No evidence of free intra-abdominal air. Right upper quadrant biliary stent with resultant pneumobilia is unchanged in positioning from prior exam. Soft tissue fullness in the left upper quadrant corresponds to gastric distention on prior CT. There is no small bowel dilatation. Minimal formed stool in the colon. IMPRESSION: 1. Soft tissue fullness in the left upper abdomen corresponds to fluid distended stomach on prior CT. No small bowel dilatation. 2. Right upper quadrant biliary stent with resultant pneumobilia is unchanged in positioning from prior exam. Electronically Signed   By: Keith Rake M.D.   On: 05/08/2021 12:49      Subjective: No new complaints.   Discharge Exam: Vitals:   05/08/21 0453 05/08/21 1300  BP: (!) 148/85 (!) 162/82  Pulse: 80 69  Resp: 16 16  Temp: 98.5 F (36.9 C) 98 F (36.7 C)  SpO2: 97% 99%   Vitals:   05/07/21 2051 05/08/21 0453 05/08/21 0850 05/08/21 1300  BP: (!) 145/71 (!) 148/85  (!) 162/82  Pulse: 81 80  69  Resp: '16 16  16  '$ Temp: 98.4 F (36.9 C) 98.5 F (36.9 C)  98 F (36.7 C)  TempSrc: Oral Oral Oral Oral  SpO2: 96% 97%  99%  Weight:  66.9 kg    Height:   '5\' 11"'$  (1.803 m)     General: Pt is alert, awake, not in acute distress Cardiovascular: RRR, S1/S2 +, no rubs, no gallops Respiratory: CTA bilaterally, no wheezing, no rhonchi Abdominal: Soft, NT, ND, bowel sounds + Extremities: no edema, no cyanosis    The results of significant diagnostics from this hospitalization (including imaging, microbiology, ancillary and laboratory) are listed below for reference.     Microbiology: Recent Results (from the past 240 hour(s))   Culture, blood (routine x 2)     Status: None (Preliminary result)   Collection Time: 05/04/21  7:19 PM   Specimen: BLOOD  Result Value Ref Range Status   Specimen Description   Final    BLOOD LEFT ANTECUBITAL Performed at [facility] [address], [place], [place] [address]    Special Requests   Final    BOTTLES DRAWN AEROBIC AND ANAEROBIC Blood Culture results may not be optimal due to an excessive volume of blood received in culture bottles Performed at [facility] [address], [place], [place] [address]    Culture   Final  NO GROWTH 3 DAYS Performed at [facility], [place] [address] [place], [place] [address]    Report Status PENDING  Incomplete  Culture, blood (routine x 2)     Status: None (Preliminary result)   Collection Time: 05/04/21  7:19 PM   Specimen: BLOOD  Result Value Ref Range Status   Specimen Description   Final    BLOOD BLOOD LEFT HAND Performed at [facility] [address] [place], [place] [address]    Special Requests   Final    BOTTLES DRAWN AEROBIC AND ANAEROBIC Blood Culture adequate volume Performed at [facility] [address], [place], [place] [address]    Culture   Final    NO GROWTH 3 DAYS Performed at [facility], [place] [address] [place], [place] [address]    Report Status PENDING  Incomplete  Resp Panel by RT-PCR (Flu A&B, Covid) Nasopharyngeal Swab     Status: None   Collection Time: 05/04/21  9:28 PM   Specimen: Nasopharyngeal Swab; Nasopharyngeal(NP) swabs in vial transport medium  Result Value Ref Range Status   SARS Coronavirus 2 by RT PCR NEGATIVE NEGATIVE Final    Comment: (NOTE) SARS-CoV-2 target nucleic acids are NOT DETECTED.  The SARS-CoV-2 RNA is generally detectable in upper respiratory specimens during the acute phase of infection. The lowest concentration of SARS-CoV-2 viral copies this assay can detect is 138  copies/mL. A negative result does not preclude SARS-Cov-2 infection and should not be used as the sole basis for treatment or other patient management decisions. A negative result may occur with  improper specimen collection/handling, submission of specimen other than nasopharyngeal swab, presence of viral mutation(s) within the areas targeted by this assay, and inadequate number of viral copies(<138 copies/mL). A negative result must be combined with clinical observations, patient history, and epidemiological information. The expected result is Negative.  Fact Sheet for Patients:  EntrepreneurPulse.com.au  Fact Sheet for Healthcare Providers:  IncredibleEmployment.be  This test is no t yet approved or cleared by the [place] FDA and  has been authorized for detection and/or diagnosis of SARS-CoV-2 by FDA under an Emergency Use Authorization (EUA). This EUA will remain  in effect (meaning this test can be used) for the duration of the COVID-19 declaration under Section 564(b)(1) of the Act, 21 U.S.C.section 360bbb-3(b)(1), unless the authorization is terminated  or revoked sooner.       Influenza A by PCR NEGATIVE NEGATIVE Final   Influenza B by PCR NEGATIVE NEGATIVE Final    Comment: (NOTE) The Xpert Xpress SARS-CoV-2/FLU/RSV plus assay is intended as an aid in the diagnosis of influenza from Nasopharyngeal swab specimens and should not be used as a sole basis for treatment. Nasal washings and aspirates are unacceptable for Xpert Xpress SARS-CoV-2/FLU/RSV testing.  Fact Sheet for Patients: EntrepreneurPulse.com.au  Fact Sheet for Healthcare Providers: IncredibleEmployment.be  This test is not yet approved or cleared by the [place] FDA and has been authorized for detection and/or diagnosis of SARS-CoV-2 by FDA under an Emergency Use Authorization (EUA). This EUA will remain in effect (meaning  this test can be used) for the duration of the COVID-19 declaration under Section 564(b)(1) of the Act, 21 U.S.C. section 360bbb-3(b)(1), unless the authorization is terminated or revoked.  Performed at [facility], [place] [address] [place], [place] [address]      Labs: BNP (last 3 results) No results for input(s): BNP in the last 8760 hours. Basic Metabolic  Panel: Recent Labs  Lab 05/04/21 1801 05/04/21 2146 05/05/21 0434 05/06/21 0555 05/07/21 0500 05/08/21 0517  NA 126*  --  131* 132* 131* 140  K 3.0*  --  3.5 2.9* 3.7 3.8  CL 85*  --  94* 95* 97* 107  CO2 28  --  '28 25 22 25  '$ GLUCOSE 241*  --  148* 129* 168* 140*  BUN 22*  --  '17 14 11 7  '$ CREATININE 0.61  --  0.49* 0.47* 0.43* 0.41*  CALCIUM 9.2  --  9.0 8.6* 8.4* 9.0  MG  --  1.7 1.5* 1.8 1.9  --   PHOS  --   --  3.2 2.9 2.3*  --    Liver Function Tests: Recent Labs  Lab 05/04/21 1801 05/05/21 0434 05/06/21 0555 05/07/21 0500  AST '21 21 24  '$ --   ALT '15 15 14  '$ --   ALKPHOS 102 89 84  --   BILITOT 1.5* 1.1 1.4*  --   PROT 8.2* 7.4 6.8  --   ALBUMIN 3.6 3.2* 2.9* 3.2*   Recent Labs  Lab 05/04/21 1801  LIPASE 16   No results for input(s): AMMONIA in the last 168 hours. CBC: Recent Labs  Lab 05/04/21 1801 05/05/21 0434 05/06/21 0555 05/07/21 0500  WBC 10.2 8.3 13.6* 10.2  NEUTROABS 7.1  --   --   --   HGB 13.4 12.1* 11.8* 12.0*  HCT 38.8* 34.9* 34.8* 35.6*  MCV 88.6 88.6 90.2 91.0  PLT 233 183 163 174   Cardiac Enzymes: No results for input(s): CKTOTAL, CKMB, CKMBINDEX, TROPONINI in the last 168 hours. BNP: Invalid input(s): POCBNP CBG: Recent Labs  Lab 05/07/21 1210 05/07/21 1725 05/07/21 2347 05/08/21 0454 05/08/21 1310  GLUCAP 200* 176* 183* 136* 275*   D-Dimer No results for input(s): DDIMER in the last 72 hours. Hgb A1c No results for input(s): HGBA1C in the last 72 hours. Lipid Profile No results for input(s): CHOL, HDL, LDLCALC, TRIG, CHOLHDL, LDLDIRECT in  the last 72 hours. Thyroid function studies No results for input(s): TSH, T4TOTAL, T3FREE, THYROIDAB in the last 72 hours.  Invalid input(s): FREET3 Anemia work up No results for input(s): VITAMINB12, FOLATE, FERRITIN, TIBC, IRON, RETICCTPCT in the last 72 hours. Urinalysis    Component Value Date/Time   COLORURINE YELLOW (A) 05/04/2021 1746   APPEARANCEUR CLEAR (A) 05/04/2021 1746   LABSPEC >1.030 (H) 05/04/2021 1746   PHURINE 6.0 05/04/2021 1746   GLUCOSEU 250 (A) 05/04/2021 1746   HGBUR NEGATIVE 05/04/2021 1746   BILIRUBINUR MODERATE (A) 05/04/2021 1746   BILIRUBINUR neg 08/03/2017 0959   KETONESUR 40 (A) 05/04/2021 1746   PROTEINUR 100 (A) 05/04/2021 1746   UROBILINOGEN 0.2 08/03/2017 0959   UROBILINOGEN 0.2 09/28/2013 1520   NITRITE NEGATIVE 05/04/2021 1746   LEUKOCYTESUR NEGATIVE 05/04/2021 1746   Sepsis Labs Invalid input(s): PROCALCITONIN,  WBC,  LACTICIDVEN Microbiology Recent Results (from the past 240 hour(s))  Culture, blood (routine x 2)     Status: None (Preliminary result)   Collection Time: 05/04/21  7:19 PM   Specimen: BLOOD  Result Value Ref Range Status   Specimen Description   Final    BLOOD LEFT ANTECUBITAL Performed at [facility], [place] [address] [place], [place] [address]    Special Requests   Final    BOTTLES DRAWN AEROBIC AND ANAEROBIC Blood Culture results may not be optimal due to an excessive volume of blood received in culture bottles Performed at [facility]  [place] [address], [place], [place] [address]    Culture   Final    NO GROWTH 3 DAYS Performed at [facility], [place] [address] [place], [place] [address]    Report Status PENDING  Incomplete  Culture, blood (routine x 2)     Status: None (Preliminary result)   Collection Time: 05/04/21  7:19 PM   Specimen: BLOOD  Result Value Ref Range Status   Specimen Description   Final    BLOOD BLOOD LEFT HAND Performed at [facility] [address] [place], [place] [address]    Special Requests   Final    BOTTLES DRAWN AEROBIC AND ANAEROBIC Blood Culture adequate volume Performed at [facility] [address], [place], [place] [address]    Culture   Final    NO GROWTH 3 DAYS Performed at [facility], [place] [address] [place], [place] [address]    Report Status PENDING  Incomplete  Resp Panel by RT-PCR (Flu A&B, Covid) Nasopharyngeal Swab     Status: None   Collection Time: 05/04/21  9:28 PM   Specimen: Nasopharyngeal Swab; Nasopharyngeal(NP) swabs in vial transport medium  Result Value Ref Range Status   SARS Coronavirus 2 by RT PCR NEGATIVE NEGATIVE Final    Comment: (NOTE) SARS-CoV-2 target nucleic acids are NOT DETECTED.  The SARS-CoV-2 RNA is generally detectable in upper respiratory specimens during the acute phase of infection. The lowest concentration of SARS-CoV-2 viral copies this assay can detect is 138 copies/mL. A negative result does not preclude SARS-Cov-2 infection and should not be used as the sole basis for treatment or other patient management decisions. A negative result may occur with  improper specimen collection/handling, submission of specimen other than nasopharyngeal swab, presence of viral mutation(s) within the areas targeted by this assay, and inadequate number of viral copies(<138 copies/mL). A negative result must be combined with clinical observations, patient history, and epidemiological information. The expected result is Negative.  Fact Sheet for Patients:  EntrepreneurPulse.com.au  Fact Sheet for Healthcare Providers:  IncredibleEmployment.be  This test is no t yet approved or cleared by the [place] FDA and  has been authorized for detection and/or diagnosis of SARS-CoV-2 by FDA under an Emergency Use Authorization (EUA). This EUA will remain  in effect (meaning this test can be used) for the  duration of the COVID-19 declaration under Section 564(b)(1) of the Act, 21 U.S.C.section 360bbb-3(b)(1), unless the authorization is terminated  or revoked sooner.       Influenza A by PCR NEGATIVE NEGATIVE Final   Influenza B by PCR NEGATIVE NEGATIVE Final    Comment: (NOTE) The Xpert Xpress SARS-CoV-2/FLU/RSV plus assay is intended as an aid in the diagnosis of influenza from Nasopharyngeal swab specimens and should not be used as a sole basis for treatment. Nasal washings and aspirates are unacceptable for Xpert Xpress SARS-CoV-2/FLU/RSV testing.  Fact Sheet for Patients: EntrepreneurPulse.com.au  Fact Sheet for Healthcare Providers: IncredibleEmployment.be  This test is not yet approved or cleared by the [place] FDA and has been authorized for detection and/or diagnosis of SARS-CoV-2 by FDA under an Emergency Use Authorization (EUA). This EUA will remain in effect (meaning this test can be used) for the duration of the COVID-19 declaration under Section 564(b)(1) of the Act, 21 U.S.C. section 360bbb-3(b)(1), unless the authorization is terminated or revoked.  Performed at [facility], [place] [address], [place], [place] [address]  Time coordinating discharge: 36 minutes  SIGNED:   Hosie Poisson, MD  [facility]

## 2021-05-10 ENCOUNTER — Inpatient Hospital Stay: Payer: Medicaid Other

## 2021-05-10 LAB — CULTURE, BLOOD (ROUTINE X 2)
Culture: NO GROWTH
Culture: NO GROWTH
Special Requests: ADEQUATE

## 2021-05-12 ENCOUNTER — Emergency Department (HOSPITAL_COMMUNITY): Payer: Medicaid Other

## 2021-05-12 ENCOUNTER — Telehealth: Payer: Self-pay

## 2021-05-12 ENCOUNTER — Telehealth: Payer: Self-pay | Admitting: *Deleted

## 2021-05-12 ENCOUNTER — Encounter (HOSPITAL_COMMUNITY): Payer: Self-pay

## 2021-05-12 ENCOUNTER — Inpatient Hospital Stay (HOSPITAL_COMMUNITY)
Admission: EM | Admit: 2021-05-12 | Discharge: 2021-05-14 | DRG: 074 | Disposition: A | Discharge status: Home Health | Payer: Medicaid Other | Attending: Family Medicine | Admitting: Family Medicine

## 2021-05-12 ENCOUNTER — Other Ambulatory Visit: Payer: Self-pay

## 2021-05-12 DIAGNOSIS — E1140 Type 2 diabetes mellitus with diabetic neuropathy, unspecified: Secondary | ICD-10-CM | POA: Diagnosis present

## 2021-05-12 DIAGNOSIS — E860 Dehydration: Secondary | ICD-10-CM | POA: Diagnosis present

## 2021-05-12 DIAGNOSIS — R1111 Vomiting without nausea: Secondary | ICD-10-CM

## 2021-05-12 DIAGNOSIS — E119 Type 2 diabetes mellitus without complications: Secondary | ICD-10-CM

## 2021-05-12 DIAGNOSIS — B182 Chronic viral hepatitis C: Secondary | ICD-10-CM | POA: Diagnosis present

## 2021-05-12 DIAGNOSIS — Y92009 Unspecified place in unspecified non-institutional (private) residence as the place of occurrence of the external cause: Secondary | ICD-10-CM

## 2021-05-12 DIAGNOSIS — E1165 Type 2 diabetes mellitus with hyperglycemia: Secondary | ICD-10-CM | POA: Diagnosis present

## 2021-05-12 DIAGNOSIS — W19XXXA Unspecified fall, initial encounter: Secondary | ICD-10-CM | POA: Diagnosis present

## 2021-05-12 DIAGNOSIS — Z794 Long term (current) use of insulin: Secondary | ICD-10-CM

## 2021-05-12 DIAGNOSIS — Z823 Family history of stroke: Secondary | ICD-10-CM

## 2021-05-12 DIAGNOSIS — E1143 Type 2 diabetes mellitus with diabetic autonomic (poly)neuropathy: Principal | ICD-10-CM | POA: Diagnosis present

## 2021-05-12 DIAGNOSIS — Z800 Family history of malignant neoplasm of digestive organs: Secondary | ICD-10-CM

## 2021-05-12 DIAGNOSIS — Z79899 Other long term (current) drug therapy: Secondary | ICD-10-CM

## 2021-05-12 DIAGNOSIS — N179 Acute kidney failure, unspecified: Secondary | ICD-10-CM | POA: Diagnosis not present

## 2021-05-12 DIAGNOSIS — Z89432 Acquired absence of left foot: Secondary | ICD-10-CM

## 2021-05-12 DIAGNOSIS — E785 Hyperlipidemia, unspecified: Secondary | ICD-10-CM | POA: Diagnosis present

## 2021-05-12 DIAGNOSIS — Z811 Family history of alcohol abuse and dependence: Secondary | ICD-10-CM

## 2021-05-12 DIAGNOSIS — C259 Malignant neoplasm of pancreas, unspecified: Secondary | ICD-10-CM | POA: Diagnosis present

## 2021-05-12 DIAGNOSIS — F17210 Nicotine dependence, cigarettes, uncomplicated: Secondary | ICD-10-CM | POA: Diagnosis present

## 2021-05-12 DIAGNOSIS — Z833 Family history of diabetes mellitus: Secondary | ICD-10-CM

## 2021-05-12 DIAGNOSIS — F32A Depression, unspecified: Secondary | ICD-10-CM | POA: Diagnosis present

## 2021-05-12 DIAGNOSIS — Z803 Family history of malignant neoplasm of breast: Secondary | ICD-10-CM

## 2021-05-12 DIAGNOSIS — Z8249 Family history of ischemic heart disease and other diseases of the circulatory system: Secondary | ICD-10-CM

## 2021-05-12 DIAGNOSIS — D72829 Elevated white blood cell count, unspecified: Secondary | ICD-10-CM | POA: Diagnosis not present

## 2021-05-12 DIAGNOSIS — K3184 Gastroparesis: Secondary | ICD-10-CM | POA: Diagnosis present

## 2021-05-12 DIAGNOSIS — Z91030 Bee allergy status: Secondary | ICD-10-CM

## 2021-05-12 DIAGNOSIS — I700 Atherosclerosis of aorta: Secondary | ICD-10-CM | POA: Diagnosis present

## 2021-05-12 DIAGNOSIS — K219 Gastro-esophageal reflux disease without esophagitis: Secondary | ICD-10-CM | POA: Diagnosis present

## 2021-05-12 DIAGNOSIS — D75839 Thrombocytosis, unspecified: Secondary | ICD-10-CM | POA: Diagnosis not present

## 2021-05-12 DIAGNOSIS — R109 Unspecified abdominal pain: Secondary | ICD-10-CM

## 2021-05-12 DIAGNOSIS — E118 Type 2 diabetes mellitus with unspecified complications: Secondary | ICD-10-CM

## 2021-05-12 DIAGNOSIS — C250 Malignant neoplasm of head of pancreas: Secondary | ICD-10-CM | POA: Diagnosis present

## 2021-05-12 DIAGNOSIS — E739 Lactose intolerance, unspecified: Secondary | ICD-10-CM | POA: Diagnosis present

## 2021-05-12 DIAGNOSIS — Z20822 Contact with and (suspected) exposure to covid-19: Secondary | ICD-10-CM | POA: Diagnosis present

## 2021-05-12 DIAGNOSIS — I959 Hypotension, unspecified: Secondary | ICD-10-CM | POA: Diagnosis present

## 2021-05-12 DIAGNOSIS — E871 Hypo-osmolality and hyponatremia: Secondary | ICD-10-CM

## 2021-05-12 LAB — COMPREHENSIVE METABOLIC PANEL
ALT: 13 U/L (ref 0–44)
AST: 20 U/L (ref 15–41)
Albumin: 3.2 g/dL — ABNORMAL LOW (ref 3.5–5.0)
Alkaline Phosphatase: 76 U/L (ref 38–126)
Anion gap: >20 — ABNORMAL HIGH (ref 5–15)
BUN: 60 mg/dL — ABNORMAL HIGH (ref 6–20)
CO2: 28 mmol/L (ref 22–32)
Calcium: 8.7 mg/dL — ABNORMAL LOW (ref 8.9–10.3)
Chloride: 76 mmol/L — ABNORMAL LOW (ref 98–111)
Creatinine, Ser: 1.61 mg/dL — ABNORMAL HIGH (ref 0.61–1.24)
GFR, Estimated: 49 mL/min — ABNORMAL LOW (ref >60)
Glucose, Bld: 476 mg/dL — ABNORMAL HIGH (ref 70–99)
Potassium: 3.8 mmol/L (ref 3.5–5.1)
Sodium: 131 mmol/L — ABNORMAL LOW (ref 135–145)
Total Bilirubin: 2 mg/dL — ABNORMAL HIGH (ref 0.3–1.2)
Total Protein: 7.2 g/dL (ref 6.5–8.1)

## 2021-05-12 LAB — RAPID URINE DRUG SCREEN, HOSP PERFORMED
Amphetamines: NOT DETECTED
Barbiturates: NOT DETECTED
Benzodiazepines: NOT DETECTED
Cocaine: NOT DETECTED
Opiates: NOT DETECTED
Tetrahydrocannabinol: NOT DETECTED

## 2021-05-12 LAB — BLOOD GAS, ARTERIAL
Acid-Base Excess: 6.4 mmol/L — ABNORMAL HIGH (ref 0.0–2.0)
Bicarbonate: 30.5 mmol/L — ABNORMAL HIGH (ref 20.0–28.0)
Drawn by: 25788
FIO2: 21
O2 Saturation: 89.5 %
Patient temperature: 98.6
pCO2 arterial: 42.9 mmHg (ref 32.0–48.0)
pH, Arterial: 7.465 — ABNORMAL HIGH (ref 7.350–7.450)
pO2, Arterial: 66.4 mmHg — ABNORMAL LOW (ref 83.0–108.0)

## 2021-05-12 LAB — CBC WITH DIFFERENTIAL/PLATELET
Abs Immature Granulocytes: 0.12 K/uL — ABNORMAL HIGH (ref 0.00–0.07)
Basophils Absolute: 0.1 K/uL (ref 0.0–0.1)
Basophils Relative: 0 %
Eosinophils Absolute: 0 K/uL (ref 0.0–0.5)
Eosinophils Relative: 0 %
HCT: 34.8 % — ABNORMAL LOW (ref 39.0–52.0)
Hemoglobin: 11.9 g/dL — ABNORMAL LOW (ref 13.0–17.0)
Immature Granulocytes: 1 %
Lymphocytes Relative: 12 %
Lymphs Abs: 2.3 K/uL (ref 0.7–4.0)
MCH: 30.4 pg (ref 26.0–34.0)
MCHC: 34.2 g/dL (ref 30.0–36.0)
MCV: 89 fL (ref 80.0–100.0)
Monocytes Absolute: 2.7 K/uL — ABNORMAL HIGH (ref 0.1–1.0)
Monocytes Relative: 14 %
Neutro Abs: 14.4 K/uL — ABNORMAL HIGH (ref 1.7–7.7)
Neutrophils Relative %: 73 %
Platelets: 469 K/uL — ABNORMAL HIGH (ref 150–400)
RBC: 3.91 MIL/uL — ABNORMAL LOW (ref 4.22–5.81)
RDW: 14 % (ref 11.5–15.5)
WBC: 19.5 K/uL — ABNORMAL HIGH (ref 4.0–10.5)
nRBC: 0 % (ref 0.0–0.2)

## 2021-05-12 LAB — BASIC METABOLIC PANEL
Anion gap: 12 (ref 5–15)
BUN: 41 mg/dL — ABNORMAL HIGH (ref 6–20)
CO2: 24 mmol/L (ref 22–32)
Calcium: 6.2 mg/dL — CL (ref 8.9–10.3)
Chloride: 103 mmol/L (ref 98–111)
Creatinine, Ser: 0.76 mg/dL (ref 0.61–1.24)
GFR, Estimated: >60 mL/min (ref >60)
Glucose, Bld: 299 mg/dL — ABNORMAL HIGH (ref 70–99)
Potassium: 2.3 mmol/L — CL (ref 3.5–5.1)
Sodium: 139 mmol/L (ref 135–145)

## 2021-05-12 LAB — URINALYSIS, ROUTINE W REFLEX MICROSCOPIC
Bacteria, UA: NONE SEEN
Glucose, UA: >=500 mg/dL — AB
Hgb urine dipstick: NEGATIVE
Ketones, ur: 80 mg/dL — AB
Leukocytes,Ua: NEGATIVE
Nitrite: NEGATIVE
Protein, ur: NEGATIVE mg/dL
Specific Gravity, Urine: 1.02 (ref 1.005–1.030)
pH: 6 (ref 5.0–8.0)

## 2021-05-12 LAB — MAGNESIUM: Magnesium: 1.3 mg/dL — ABNORMAL LOW (ref 1.7–2.4)

## 2021-05-12 LAB — CBG MONITORING, ED
Glucose-Capillary: 425 mg/dL — ABNORMAL HIGH (ref 70–99)
Glucose-Capillary: 334 mg/dL — ABNORMAL HIGH (ref 70–99)

## 2021-05-12 LAB — LACTIC ACID, PLASMA: Lactic Acid, Venous: 1.4 mmol/L (ref 0.5–1.9)

## 2021-05-12 LAB — RESP PANEL BY RT-PCR (FLU A&B, COVID) ARPGX2
Influenza A by PCR: NEGATIVE
Influenza B by PCR: NEGATIVE
SARS Coronavirus 2 by RT PCR: NEGATIVE

## 2021-05-12 LAB — LIPASE, BLOOD: Lipase: 17 U/L (ref 11–51)

## 2021-05-12 LAB — BETA-HYDROXYBUTYRIC ACID: Beta-Hydroxybutyric Acid: 7.86 mmol/L — ABNORMAL HIGH (ref 0.05–0.27)

## 2021-05-12 MED ORDER — SODIUM CHLORIDE 0.9 % IV BOLUS
1000.0000 mL | Freq: Once | INTRAVENOUS | Status: AC
  Administered 2021-05-12: 1000 mL via INTRAVENOUS

## 2021-05-12 MED ORDER — POTASSIUM CHLORIDE 10 MEQ/100ML IV SOLN
10.0000 meq | INTRAVENOUS | Status: AC
  Administered 2021-05-12 – 2021-05-13 (×6): 10 meq via INTRAVENOUS
  Filled 2021-05-12 (×6): qty 100

## 2021-05-12 MED ORDER — ACETAMINOPHEN 650 MG RE SUPP: 650.0000 mg | Freq: Four times a day (QID) | RECTAL | Status: DC | PRN

## 2021-05-12 MED ORDER — INSULIN GLARGINE-YFGN 100 UNIT/ML ~~LOC~~ SOLN
10.0000 [IU] | Freq: Every day | SUBCUTANEOUS | Status: DC
  Administered 2021-05-12: 10 [IU] via SUBCUTANEOUS
  Filled 2021-05-12: qty 0.1

## 2021-05-12 MED ORDER — ACETAMINOPHEN 325 MG PO TABS: 650.0000 mg | ORAL_TABLET | Freq: Four times a day (QID) | ORAL | Status: DC | PRN

## 2021-05-12 MED ORDER — LACTATED RINGERS IV BOLUS: 1000.0000 mL | Freq: Once | INTRAVENOUS | Status: DC

## 2021-05-12 MED ORDER — MAGNESIUM SULFATE 2 GM/50ML IV SOLN
2.0000 g | Freq: Once | INTRAVENOUS | Status: AC
  Administered 2021-05-12: 2 g via INTRAVENOUS
  Filled 2021-05-12: qty 50

## 2021-05-12 MED ORDER — ONDANSETRON HCL 4 MG PO TABS: 4.0000 mg | ORAL_TABLET | Freq: Four times a day (QID) | ORAL | Status: DC | PRN

## 2021-05-12 MED ORDER — POTASSIUM CHLORIDE 10 MEQ/100ML IV SOLN: 10.0000 meq | Freq: Once | INTRAVENOUS | Status: DC

## 2021-05-12 MED ORDER — INSULIN GLARGINE-YFGN 100 UNIT/ML ~~LOC~~ SOLN
10.0000 [IU] | Freq: Every day | SUBCUTANEOUS | Status: DC
  Filled 2021-05-12: qty 0.1

## 2021-05-12 MED ORDER — METOCLOPRAMIDE HCL 5 MG/ML IJ SOLN
10.0000 mg | Freq: Four times a day (QID) | INTRAMUSCULAR | Status: DC
  Administered 2021-05-12 – 2021-05-14 (×7): 10 mg via INTRAVENOUS
  Filled 2021-05-12 (×7): qty 2

## 2021-05-12 MED ORDER — INSULIN ASPART 100 UNIT/ML IJ SOLN
0.0000 [IU] | INTRAMUSCULAR | Status: DC
Start: 2021-05-12 — End: 2021-05-14
  Administered 2021-05-12: 11 [IU] via SUBCUTANEOUS
  Administered 2021-05-12: 15 [IU] via SUBCUTANEOUS
  Administered 2021-05-13: 8 [IU] via SUBCUTANEOUS
  Administered 2021-05-13: 5 [IU] via SUBCUTANEOUS
  Administered 2021-05-13: 3 [IU] via SUBCUTANEOUS
  Administered 2021-05-13 (×2): 5 [IU] via SUBCUTANEOUS
  Administered 2021-05-13: 3 [IU] via SUBCUTANEOUS
  Administered 2021-05-14 (×3): 2 [IU] via SUBCUTANEOUS
  Filled 2021-05-12: qty 0.15

## 2021-05-12 MED ORDER — ONDANSETRON HCL 4 MG/2ML IJ SOLN: 4.0000 mg | Freq: Four times a day (QID) | INTRAMUSCULAR | Status: DC | PRN

## 2021-05-12 MED ORDER — SODIUM CHLORIDE 0.9 % IV SOLN: INTRAVENOUS | Status: DC

## 2021-05-12 MED ORDER — HEPARIN SODIUM (PORCINE) 5000 UNIT/ML IJ SOLN
5000.0000 [IU] | Freq: Three times a day (TID) | INTRAMUSCULAR | Status: DC
  Administered 2021-05-12 – 2021-05-14 (×5): 5000 [IU] via SUBCUTANEOUS
  Filled 2021-05-12 (×5): qty 1

## 2021-05-12 NOTE — Telephone Encounter (Signed)
Agree with nursing recommendations.    Excellent summary and note.  He did not have any obstruction at EGD last week so I think there is significant dysfunction of the stomach and upper intestine perhaps related to chemotherapy or the pancreatic cancer itself affecting the nerves and muscles of the stomach  Additions to these RN recommendations:  #1 would stay on a liquid or very soft diet  #2 if he is having more problems like described the ER is where he needs to go   #3 regarding palliative care consult I suggest they ask oncology for a referral I am CCing his oncologist Dr. Lorenso Courier so he is aware and question if he needs a sooner than scheduled appointment  with oncology

## 2021-05-12 NOTE — Telephone Encounter (Signed)
Lm on vm for patient to return call 

## 2021-05-12 NOTE — ED Triage Notes (Signed)
Patient states that he was discharged from the hospital on 05/09/21. Patient states he is unable to eat, feels weak and fatigued. Patient states he fell this AM.  Patient states his abdomen is swelling and he had fluid drained on 05/08/21.  Patient reports has has been vomiting and has a history of pancreatic cancer.

## 2021-05-12 NOTE — Telephone Encounter (Signed)
Dr. Carlean Purl as DOD AM of 05/12/21  [name] [name], hx of cirrhosis, pancreatic cancer, recently seen in the ED for possible gastric outlet obstruction, loose stools.  Pt came in the office today accompanied by his mother. Pt states that he was seen in the hospital last week but still had some concerns. Pt reports that last Thursday night he had an episode of large volume vomiting and on Friday also. Pt reports that he has not taken any antiemetics since he was in the hospital. Pt states that he had a bowel movement this morning that was diarrhea. Pt is not sure if he has had a fever at all. Pt states that he was able to eat 1 scrambled egg yesterday. Advised [name] that he needs to try to eat small frequent meals, he can supplement with Boost or Ensure. Pt also expressed concerns about his abdomen being distended and hard. No lower extremity edema, not on any diuretics. [name] states that he was dizzy this morning and fell. [name] did have a small cut on his left knee, I covered this with a band-aid. Advised [name] that if he is not able to tolerate any solid foods or liquids he would have to go back to the ED for evaluation. I have scheduled [name] for a hospital follow up with Dr. Havery Moros on Thursday, 05/15/21 at 4 PM. [name]'s mother is very concerned and wanted to know if anything can be done sooner. That is the soonest appt. Advised again that if his symptoms worsened before then he would need to go to ED. Advised that I will check and see if the covering provider had any recommendations until appt. Mother is also interested in Palliative care, advised that this could be discussed at time of OV. Please advise, thanks.

## 2021-05-12 NOTE — Plan of Care (Signed)
  Problem: Education: Goal: Knowledge of General Education information will improve Description Including pain rating scale, medication(s)/side effects and non-pharmacologic comfort measures Outcome: Progressing   

## 2021-05-12 NOTE — ED Provider Notes (Signed)
Emergency Medicine Provider Triage Evaluation Note  [name] [name] , a 60 y.o. male  was evaluated in triage.  Pt complains of nausea, vomiting, abdominal distention.  History of pancreatic cancer, recent admission to the hospital, discharged 3 days ago.  Since then, continued to have issues with postprandial pain, nausea, vomiting, distention.  No fevers.  Decreased urine output.  Difficulty having bowel movements.  Last chemo treatment was 2 weeks ago  Review of Systems  Positive: Nausea, vomiting, abdominal distention Negative: Fever  Physical Exam  BP 98/64 (BP Location: Left Arm)   Pulse (!) 112   Temp 98.2 F (36.8 C) (Oral)   Resp 18   Ht '5\' 11"'$  (1.803 m)   Wt 65.8 kg   SpO2 98%   BMI 20.22 kg/m  Gen:   Chronically ill Resp:  Normal effort  MSK:   Moves extremities without difficulty  Other:  Tenderness palpation of upper abdomen.  Mildly distended.  No rigidity.  Medical Decision Making  Medically screening exam initiated at 3:18 PM.  Appropriate orders placed.  [name] was informed that the remainder of the evaluation will be completed by another provider, this initial triage assessment does not replace that evaluation, and the importance of remaining in the ED until their evaluation is complete.  Labs. Pts BP low, [id number] systolic. Informed RN that pt needed to go to a room next.    Franchot Heidelberg, PA-C 05/12/21 1519    Hayden Rasmussen, MD 05/12/21 2130

## 2021-05-12 NOTE — Telephone Encounter (Signed)
Received call from pt's mother. She is very worried about him. She states he is very weak, cannot eat or drink much without vomiting. She states his abdomen is very distended. He has fallen a few times due to increasing weakness.  She had taken him to the GI doctor today but no new plans. She took him to where he lives and she is so worried about him. Advised that the best action would be to get him to the ED @ [place] to get evaluated and hopefully admitted so we can continue to work on getting him nutrition and fluids and then make more appropriate discharge plans so that he is not living alone in a place where  his source of water is garden hose (per his mother)  Advised that I would try to call him.  Attempted call to patient pt. No answer -call when straight to vm. Call made to his mother again. She states she will continue to try and call him. She is unable to get to his home due to its location and her trouble with walking.  Dr. Lorenso Courier made aware .  Received call back from pt's mother and spoke with her. She was able to get him to the ED @ [facility] at about 3 pm. She is waiting in the ED waiting room.  Dr. Lorenso Courier made aware.

## 2021-05-12 NOTE — Progress Notes (Signed)
    OVERNIGHT PROGRESS REPORT  Recent lab draw has resulted.  Potassium and Magnesium values are addressed and in process of correction by infusion.  Follow up labs are currently already ordered.   Gershon Cull MSNA MSN ACNPC-AG Acute Care Nurse Practitioner [place]

## 2021-05-12 NOTE — Telephone Encounter (Signed)
Transition Care Management Follow-up Telephone Call Date of discharge and from where: 05/08/2021, [facility] How have you been since you were released from the hospital? He said he is not doing very well. He is very weak, continues to vomit and is not able to keep any food down. He said he can't stand up very well and has fallen since returning home. He was at GI appointment at the time of this call.  Any questions or concerns? Yes -noted above.   Items Reviewed: Did the pt receive and understand the discharge instructions provided? Yes  Medications obtained and verified? Yes  - he said he picked up everything today Other? No  Any new allergies since your discharge? No  Dietary orders reviewed? Yes - he is not able to tolerate any food or drink at this time.  Do you have support at home?  Not addressed as he was at his appointment  Home Care and Equipment/Supplies: Were home health services ordered? no If so, what is the name of the agency? N/a  Has the agency set up a time to come to the patient's home? not applicable Were any new equipment or medical supplies ordered?  No What is the name of the medical supply agency? N/a Were you able to get the supplies/equipment? not applicable Do you have any questions related to the use of the equipment or supplies? No  Functional Questionnaire: (I = Independent and D = Dependent) ADLs: he has been independent. Has cane to use with ambulation but may need additional adaptive equipment/ mobility assistance.    Follow up appointments reviewed:  PCP Hospital f/u appt confirmed? Yes  Scheduled to see [name], NP  on 05/14/2021  Specialist Hospital f/u appt confirmed? Yes  - currently at GI appointment.  Has oncology appointment 05/22/2021.  Are transportation arrangements needed? No  If their condition worsens, is the pt aware to call PCP or go to the Emergency Dept.? Yes Was the patient provided with contact information for the PCP's  office or ED? Yes Was to pt encouraged to call back with questions or concerns? Yes

## 2021-05-12 NOTE — H&P (Signed)
History and Physical    [name] [name] [id number] DOB: 15-Jul-1961 DOA: 05/12/2021  I have briefly reviewed the patient's prior medical records in [place]  PCP: [name], NP  Patient coming from: home  Chief Complaint: weakness  HPI: [name] [name] is a 60 y.o. male with medical history significant of pancreatic cancer, recent admission to the hospital, discharged 3 days ago.  Since then, continued to have issues with postprandial pain, nausea, vomiting, distention.  No fevers.  Decreased urine output.  He had EGD on 9/6 showing grade C esophagitis without bleeding and large amount of food residue in the stomach without evidence of gastric outlet obstruction.  Thought to be secondary to mass-effect from pancreatic mass contributing to the abdominal pain.   He was instructed to eat small/frequent meals.   He states he fell at home. Mother reported to a nurse that he lives alone and his source of water is his garden hose.   In the ER, he was found to be hypotensive, have hyponatremia, AKI, elevated blood sugars.     Review of Systems: As per HPI otherwise 10 point review of systems negative.   Past Medical History:  Diagnosis Date   Barrett's esophagus dx 2016   Bronchitis    Chronic hepatitis C without hepatic coma ([place]) 10/31/2014   Depression    Diabetes ([place])    ED (erectile dysfunction)    GERD (gastroesophageal reflux disease)    Hepatitis C    Hiatal hernia 10/2014   3cm   Neuropathy    S/P transmetatarsal amputation of foot, left ([place]) 11/28/2020    Past Surgical History:  Procedure Laterality Date   AMPUTATION Left 11/15/2020   Procedure: LEFT TRANSMETATARSALS AMPUTATION;  Surgeon: Newt Minion, MD;  Location: [place];  Service: Orthopedics;  Laterality: Left;   APPENDECTOMY     BILIARY STENT PLACEMENT N/A 02/27/2021   Procedure: BILIARY STENT PLACEMENT;  Surgeon: Milus Banister, MD;  Location: [facility];  Service: Endoscopy;  Laterality: N/A;    BILIARY STENT PLACEMENT N/A 03/27/2021   Procedure: BILIARY STENT PLACEMENT;  Surgeon: Jackquline Denmark, MD;  Location: [facility];  Service: Endoscopy;  Laterality: N/A;   BIOPSY  03/27/2021   Procedure: BIOPSY;  Surgeon: Jackquline Denmark, MD;  Location: [facility];  Service: Endoscopy;;   COLONOSCOPY N/A 02/16/2014   Procedure: COLONOSCOPY;  Surgeon: Gatha Mayer, MD;  Location: [facility];  Service: Endoscopy;  Laterality: N/A;   COLONOSCOPY     ENDOSCOPIC RETROGRADE CHOLANGIOPANCREATOGRAPHY (ERCP) WITH PROPOFOL N/A 02/27/2021   Procedure: ENDOSCOPIC RETROGRADE CHOLANGIOPANCREATOGRAPHY (ERCP) WITH PROPOFOL;  Surgeon: Milus Banister, MD;  Location: [facility];  Service: Endoscopy;  Laterality: N/A;   ERCP N/A 03/27/2021   Procedure: ENDOSCOPIC RETROGRADE CHOLANGIOPANCREATOGRAPHY (ERCP);  Surgeon: Jackquline Denmark, MD;  [facility];  Service: Endoscopy;  Laterality: N/A;   ESOPHAGOGASTRODUODENOSCOPY (EGD) WITH PROPOFOL N/A 05/06/2021   Procedure: ESOPHAGOGASTRODUODENOSCOPY (EGD) WITH PROPOFOL;  Surgeon: Mauri Pole, MD;  Location: [facility];  Service: Endoscopy;  Laterality: N/A;   EUS N/A 02/27/2021   Procedure: UPPER ENDOSCOPIC ULTRASOUND (EUS) RADIAL;  Surgeon: Milus Banister, MD;  Location: [facility];  Service: Endoscopy;  Laterality: N/A;   FINE NEEDLE ASPIRATION N/A 02/27/2021   Procedure: FINE NEEDLE ASPIRATION (FNA) LINEAR;  Surgeon: Milus Banister, MD;  Location: [facility];  Service: Endoscopy;  Laterality: N/A;   IR IMAGING GUIDED PORT INSERTION  03/21/2021   SPHINCTEROTOMY  02/27/2021   Procedure: SPHINCTEROTOMY;  Surgeon: Milus Banister, MD;  Location: [facility];  Service: Endoscopy;;   STENT REMOVAL  03/27/2021   Procedure: STENT REMOVAL;  Surgeon: Jackquline Denmark, MD;  Location: [facility];  Service: Endoscopy;;   TONSILLECTOMY       reports that he has been smoking cigarettes. He has a 17.50 pack-year smoking history. He has never used  smokeless tobacco. He reports that he does not currently use alcohol. He reports that he does not use drugs.  Allergies  Allergen Reactions   Bee Venom Anaphylaxis   Lactose Intolerance (Gi) Other (See Comments)    GI upset    Family History  Problem Relation Age of Onset   Breast cancer Mother    Stroke Father    Alcohol abuse Father    Heart disease Maternal Grandfather    Pancreatic cancer Paternal Grandmother    Diabetes Paternal Grandfather    Colon cancer Neg Hx    Stomach cancer Neg Hx    Rectal cancer Neg Hx    Esophageal cancer Neg Hx     Prior to Admission medications   Medication Sig Start Date End Date Taking? Authorizing Provider  albuterol (VENTOLIN HFA) 108 (90 Base) MCG/ACT inhaler Inhale 2 puffs into the lungs every 6 (six) hours as needed for wheezing or shortness of breath. 12/05/20   Gerlene Fee, NP  atorvastatin (LIPITOR) 40 MG tablet Take 40 mg by mouth daily.    [provider]  diphenoxylate-atropine (LOMOTIL) 2.5-0.025 MG tablet Take 1 tablet by mouth 4 (four) times daily as needed for diarrhea or loose stools. 04/07/21   [name], NP  DULoxetine (CYMBALTA) 60 MG capsule Take 1 capsule (60 mg total) by mouth daily. 04/04/21 07/03/21  Charlott Rakes, MD  EPINEPHrine 0.3 mg/0.3 mL IJ SOAJ injection Inject 0.3 mg into the muscle as needed for anaphylaxis. 06/11/20   [provider]  folic acid (FOLVITE) 1 MG tablet Take 1 tablet (1 mg total) by mouth in the morning. 03/28/21   Rai, Ripudeep K, MD  glucose blood (TRUE METRIX BLOOD GLUCOSE TEST) test strip Use as instructed. Check blood glucose level by fingerstick 3-4 times per day.  E11.65 03/04/21   [name], NP  insulin glargine (LANTUS SOLOSTAR) 100 UNIT/ML Solostar Pen Inject 25 units in the morning and Inject 20 units at bedtime 05/09/21   Charlott Rakes, MD  insulin lispro (HUMALOG) 100 UNIT/ML KwikPen Inject 0-9 Units into the skin 3 (three) times daily. Sliding scale CBG 70 -  120: 0 units CBG 121 - 150: 1 unit,  CBG 151 - 200: 2 units,  CBG 201 - 250: 3 units,  CBG 251 - 300: 5 units,  CBG 301 - 350: 7 units,  CBG 351 - 400: 9 units   CBG > 400: 9 units and notify your MD 03/28/21   Rai, Vernelle Emerald, MD  Insulin Pen Needle (TECHLITE PEN NEEDLES) 32G X 4 MM MISC USE AS INSTRUCTED INTO THE SKIN TWICE DAILY 03/04/21   [name], NP  Insulin Pen Needle (TRUEPLUS PEN NEEDLES) 32G X 4 MM MISC Use as instructed. Inject into the skin twice daily 03/04/21   [name], NP  lidocaine-prilocaine (EMLA) cream Apply 1 application topically as directed as needed. Patient taking differently: Apply 1 application topically as needed (access port). 03/12/21   Orson Slick, MD  lipase/protease/amylase (CREON) 36000 UNITS CPEP capsule Take 2 capsules (72,000 Units total) by mouth 3 (three) times daily with  meals. May also take 1 capsule (36,000 Units total) as needed (with snacks). NEEDS PASS. 04/07/21   [name], NP  losartan (COZAAR) 50 MG tablet Take 1 tablet (50 mg total) by mouth daily. 03/28/21 03/28/22  Rai, Vernelle Emerald, MD  magic mouthwash w/lidocaine SOLN Take 5 mLs by mouth 4 (four) times daily as needed. Patient taking differently: Take 5 mLs by mouth 4 (four) times daily as needed for mouth pain. 04/16/21   Lincoln Brigham, PA-C  metoCLOPramide (REGLAN) 5 MG tablet Take 1 tablet (5 mg total) by mouth 4 (four) times daily -  before meals and at bedtime. 05/08/21 06/22/21  Hosie Poisson, MD  mirtazapine (REMERON) 30 MG tablet Take 1 tablet (30 mg total) by mouth at bedtime. 01/07/21 06/06/21  [name], NP  Multiple Vitamins-Minerals (THEREMS-M) TABS Take 1 tablet by mouth in the morning.    [provider]  omeprazole (PRILOSEC) 40 MG capsule Take 1 capsule (40 mg total) by mouth in the morning. 03/28/21 03/28/22  Rai, Ripudeep K, MD  ondansetron (ZOFRAN) 8 MG tablet Take 1 tablet (8 mg total) by mouth 2 (two) times daily as needed. Start on day 3 after  chemotherapy. Patient taking differently: Take 8 mg by mouth 2 (two) times daily as needed for nausea or vomiting. Start on day 3 after chemotherapy. 04/10/21   Orson Slick, MD  polyethylene glycol (MIRALAX / GLYCOLAX) 17 g packet Take 17 g by mouth at bedtime. 05/08/21   Hosie Poisson, MD  prochlorperazine (COMPAZINE) 10 MG tablet Take 1 tablet (10 mg total) by mouth every 6 (six) hours as needed (Nausea or vomiting). 04/10/21   Orson Slick, MD  TRUEplus Lancets 28G MISC Use as instructed. Check blood glucose level by fingerstick 3-4 times per day.   E11.65 03/04/21   [name], NP    Physical Exam: Vitals:   05/12/21 1454 05/12/21 1505 05/12/21 1600  BP: 98/64  129/70  Pulse: (!) 112  88  Resp: 18  17  Temp: 98.2 F (36.8 C)    TempSrc: Oral    SpO2: 98%  96%  Weight:  65.8 kg   Height:  '5\' 11"'$  (1.803 m)       Constitutional: NAD, calm, comfortable Respiratory: clear to auscultation bilaterally, no wheezing, no crackles. Normal respiratory effort. No accessory muscle use.  Cardiovascular: Regular rate and rhythm, no murmurs / rubs / gallops. No extremity edema. 2+ pedal pulses.  Abdomen: no masses palpated. Bowel sounds sluggish Musculoskeletal: no clubbing / cyanosis. Normal muscle tone.  Skin: no rashes, lesions, ulcers. No induration Neurologic: CN 2-12 grossly intact. Strength 5/5 in all 4.  Psychiatric: poor insight  Labs on Admission: I have personally reviewed following labs and imaging studies  CBC: Recent Labs  Lab 05/06/21 0555 05/07/21 0500 05/12/21 1558  WBC 13.6* 10.2 19.5*  NEUTROABS  --   --  14.4*  HGB 11.8* 12.0* 11.9*  HCT 34.8* 35.6* 34.8*  MCV 90.2 91.0 89.0  PLT 163 174 [id number]*   Basic Metabolic Panel: Recent Labs  Lab 05/06/21 0555 05/07/21 0500 05/08/21 0517 05/12/21 1557  NA 132* 131* 140 131*  K 2.9* 3.7 3.8 3.8  CL 95* 97* 107 76*  CO2 '25 22 25 28  '$ GLUCOSE 129* 168* 140* 476*  BUN '14 11 7 '$ 60*  CREATININE 0.47* 0.43*  0.41* 1.61*  CALCIUM 8.6* 8.4* 9.0 8.7*  MG 1.8 1.9  --   --  PHOS 2.9 2.3*  --   --    GFR: Estimated Creatinine Clearance: 46.5 mL/min (A) (by C-G formula based on SCr of 1.61 mg/dL (H)). Liver Function Tests: Recent Labs  Lab 05/06/21 0555 05/07/21 0500 05/12/21 1557  AST 24  --  20  ALT 14  --  13  ALKPHOS 84  --  76  BILITOT 1.4*  --  2.0*  PROT 6.8  --  7.2  ALBUMIN 2.9* 3.2* 3.2*   Recent Labs  Lab 05/12/21 1557  LIPASE 17   No results for input(s): AMMONIA in the last 168 hours. Coagulation Profile: No results for input(s): INR, PROTIME in the last 168 hours. Cardiac Enzymes: No results for input(s): CKTOTAL, CKMB, CKMBINDEX, TROPONINI in the last 168 hours. BNP (last 3 results) No results for input(s): PROBNP in the last 8760 hours. HbA1C: No results for input(s): HGBA1C in the last 72 hours. CBG: Recent Labs  Lab 05/07/21 1210 05/07/21 1725 05/07/21 2347 05/08/21 0454 05/08/21 1310  GLUCAP 200* 176* 183* 136* 275*   Lipid Profile: No results for input(s): CHOL, HDL, LDLCALC, TRIG, CHOLHDL, LDLDIRECT in the last 72 hours. Thyroid Function Tests: No results for input(s): TSH, T4TOTAL, FREET4, T3FREE, THYROIDAB in the last 72 hours. Anemia Panel: No results for input(s): VITAMINB12, FOLATE, FERRITIN, TIBC, IRON, RETICCTPCT in the last 72 hours. Urine analysis:    Component Value Date/Time   COLORURINE YELLOW (A) 05/04/2021 1746   APPEARANCEUR CLEAR (A) 05/04/2021 1746   LABSPEC >1.030 (H) 05/04/2021 1746   PHURINE 6.0 05/04/2021 1746   GLUCOSEU 250 (A) 05/04/2021 1746   HGBUR NEGATIVE 05/04/2021 1746   BILIRUBINUR MODERATE (A) 05/04/2021 1746   BILIRUBINUR neg 08/03/2017 0959   KETONESUR 40 (A) 05/04/2021 1746   PROTEINUR 100 (A) 05/04/2021 1746   UROBILINOGEN 0.2 08/03/2017 0959   UROBILINOGEN 0.2 09/28/2013 1520   NITRITE NEGATIVE 05/04/2021 1746   LEUKOCYTESUR NEGATIVE 05/04/2021 1746     Radiological Exams on Admission: DG Abd 2  Views  Result Date: 05/12/2021 CLINICAL DATA:  Abdominal pain. EXAM: ABDOMEN - 2 VIEW COMPARISON:  Abdominal x-ray 05/08/2021. CT abdomen and pelvis 05/04/2021. FINDINGS: The stomach appears markedly dilated with air-fluid level. There are no dilated small bowel loops or colon. Colonic air is seen to the level the rectum. Stent is again noted in the right abdomen. There is a phlebolith in the left hemipelvis. Vascular calcifications are again noted in the left pelvis. There is no free intraperitoneal air under the diaphragm. Branching air in the right upper quadrant corresponds to pneumobilia seen on recent CT. No acute fractures are seen. There are degenerative changes of the spine and hips. IMPRESSION: 1. The stomach is dilated with large air-fluid level. This appears similar to the prior study. 2. Small bowel and colon are nondilated. Electronically Signed   By: Ronney Asters M.D.   On: 05/12/2021 16:15      Assessment/Plan Active Problems:   Diabetes type 2, controlled ([place])   Hyponatremia   Pancreatic cancer ([place])   Dehydration   Gastroparesis   AKI (acute kidney injury) ([place])    AKI due to dehydration -IVF -labs in AM  Gastroparesis -IV reglan -NPO for now  -patient w/o vomiting, even if he was then he is resistant to NG tube -recent EGD w/o gastric outlet obstruction -follows with [facility]- will need to call them in the AM to discuss options  Hyperglycemia with DM -SSI q 4 hours -lower dose of lantus  Pancreatic cancer -  follows with Dr. Lorenso Courier- added him to treatment team 04/24/2021: Cycle 2 Day 1 of FOLFIRINOX  Hyponatremia -recheck after IVF  Leukocytosis -no sign of infection -hold on abx -CBC in AM   DVT prophylaxis: scd Code Status: full Disposition Plan: obs Consults called: none-- may need GI and oncology    Admission status: obs   At the point of initial evaluation, it is my clinical opinion that admission for OBSERVATION is reasonable and necessary  because the patient's presenting complaints in the context of their chronic conditions represent sufficient risk of deterioration or significant morbidity to constitute reasonable grounds for close observation in the hospital setting, but that the patient may be medically stable for discharge from the hospital within 24 to 48 hours.      Geradine Girt [facility]   How to contact the [facility] Attending or Consulting provider [place] or covering provider during after hours [place], for this patient?  Check the care team in [facility] Hights and look for a) attending/consulting [facility] provider listed and b) the [facility] team listed Log into [email or web address] and use [facility]'s universal password to access. If you do not have the password, please contact the hospital operator. Locate the [facility] provider you are looking for under [facility] and page to a number that you can be directly reached. If you still have difficulty reaching the provider, please page the [facility] (Director on Call) for the Hospitalists listed on amion for assistance.  05/12/2021, 5:15 PM

## 2021-05-13 DIAGNOSIS — F32A Depression, unspecified: Secondary | ICD-10-CM | POA: Diagnosis present

## 2021-05-13 DIAGNOSIS — B182 Chronic viral hepatitis C: Secondary | ICD-10-CM | POA: Diagnosis present

## 2021-05-13 DIAGNOSIS — Y92009 Unspecified place in unspecified non-institutional (private) residence as the place of occurrence of the external cause: Secondary | ICD-10-CM | POA: Diagnosis not present

## 2021-05-13 DIAGNOSIS — Z8249 Family history of ischemic heart disease and other diseases of the circulatory system: Secondary | ICD-10-CM | POA: Diagnosis not present

## 2021-05-13 DIAGNOSIS — E860 Dehydration: Secondary | ICD-10-CM | POA: Diagnosis present

## 2021-05-13 DIAGNOSIS — E871 Hypo-osmolality and hyponatremia: Secondary | ICD-10-CM | POA: Diagnosis present

## 2021-05-13 DIAGNOSIS — Z823 Family history of stroke: Secondary | ICD-10-CM | POA: Diagnosis not present

## 2021-05-13 DIAGNOSIS — I959 Hypotension, unspecified: Secondary | ICD-10-CM | POA: Diagnosis present

## 2021-05-13 DIAGNOSIS — F17210 Nicotine dependence, cigarettes, uncomplicated: Secondary | ICD-10-CM | POA: Diagnosis present

## 2021-05-13 DIAGNOSIS — E1140 Type 2 diabetes mellitus with diabetic neuropathy, unspecified: Secondary | ICD-10-CM | POA: Diagnosis present

## 2021-05-13 DIAGNOSIS — K3184 Gastroparesis: Secondary | ICD-10-CM | POA: Diagnosis present

## 2021-05-13 DIAGNOSIS — Z800 Family history of malignant neoplasm of digestive organs: Secondary | ICD-10-CM | POA: Diagnosis not present

## 2021-05-13 DIAGNOSIS — W19XXXA Unspecified fall, initial encounter: Secondary | ICD-10-CM | POA: Diagnosis present

## 2021-05-13 DIAGNOSIS — C250 Malignant neoplasm of head of pancreas: Secondary | ICD-10-CM | POA: Diagnosis present

## 2021-05-13 DIAGNOSIS — Z89432 Acquired absence of left foot: Secondary | ICD-10-CM | POA: Diagnosis not present

## 2021-05-13 DIAGNOSIS — E739 Lactose intolerance, unspecified: Secondary | ICD-10-CM | POA: Diagnosis present

## 2021-05-13 DIAGNOSIS — I700 Atherosclerosis of aorta: Secondary | ICD-10-CM | POA: Diagnosis present

## 2021-05-13 DIAGNOSIS — E1143 Type 2 diabetes mellitus with diabetic autonomic (poly)neuropathy: Secondary | ICD-10-CM | POA: Diagnosis present

## 2021-05-13 DIAGNOSIS — Z811 Family history of alcohol abuse and dependence: Secondary | ICD-10-CM | POA: Diagnosis not present

## 2021-05-13 DIAGNOSIS — E1165 Type 2 diabetes mellitus with hyperglycemia: Secondary | ICD-10-CM | POA: Diagnosis present

## 2021-05-13 DIAGNOSIS — Z20822 Contact with and (suspected) exposure to covid-19: Secondary | ICD-10-CM | POA: Diagnosis present

## 2021-05-13 DIAGNOSIS — D72829 Elevated white blood cell count, unspecified: Secondary | ICD-10-CM | POA: Diagnosis not present

## 2021-05-13 DIAGNOSIS — Z803 Family history of malignant neoplasm of breast: Secondary | ICD-10-CM | POA: Diagnosis not present

## 2021-05-13 DIAGNOSIS — Z91030 Bee allergy status: Secondary | ICD-10-CM | POA: Diagnosis not present

## 2021-05-13 DIAGNOSIS — N179 Acute kidney failure, unspecified: Secondary | ICD-10-CM | POA: Diagnosis present

## 2021-05-13 DIAGNOSIS — D75839 Thrombocytosis, unspecified: Secondary | ICD-10-CM | POA: Diagnosis not present

## 2021-05-13 LAB — COMPREHENSIVE METABOLIC PANEL
ALT: 10 U/L (ref 0–44)
AST: 13 U/L — ABNORMAL LOW (ref 15–41)
Albumin: 2.7 g/dL — ABNORMAL LOW (ref 3.5–5.0)
Alkaline Phosphatase: 71 U/L (ref 38–126)
Anion gap: 10 (ref 5–15)
BUN: 36 mg/dL — ABNORMAL HIGH (ref 6–20)
CO2: 30 mmol/L (ref 22–32)
Calcium: 8.7 mg/dL — ABNORMAL LOW (ref 8.9–10.3)
Chloride: 96 mmol/L — ABNORMAL LOW (ref 98–111)
Creatinine, Ser: 0.58 mg/dL — ABNORMAL LOW (ref 0.61–1.24)
GFR, Estimated: >60 mL/min (ref >60)
Glucose, Bld: 176 mg/dL — ABNORMAL HIGH (ref 70–99)
Potassium: 3.7 mmol/L (ref 3.5–5.1)
Sodium: 136 mmol/L (ref 135–145)
Total Bilirubin: 0.7 mg/dL (ref 0.3–1.2)
Total Protein: 6.3 g/dL — ABNORMAL LOW (ref 6.5–8.1)

## 2021-05-13 LAB — CBC
HCT: 30.5 % — ABNORMAL LOW (ref 39.0–52.0)
Hemoglobin: 10.5 g/dL — ABNORMAL LOW (ref 13.0–17.0)
MCH: 30.3 pg (ref 26.0–34.0)
MCHC: 34.4 g/dL (ref 30.0–36.0)
MCV: 88.2 fL (ref 80.0–100.0)
Platelets: 352 10*3/uL (ref 150–400)
RBC: 3.46 MIL/uL — ABNORMAL LOW (ref 4.22–5.81)
RDW: 14 % (ref 11.5–15.5)
WBC: 14.8 10*3/uL — ABNORMAL HIGH (ref 4.0–10.5)
nRBC: 0 % (ref 0.0–0.2)

## 2021-05-13 LAB — GLUCOSE, CAPILLARY
Glucose-Capillary: 123 mg/dL — ABNORMAL HIGH (ref 70–99)
Glucose-Capillary: 179 mg/dL — ABNORMAL HIGH (ref 70–99)
Glucose-Capillary: 181 mg/dL — ABNORMAL HIGH (ref 70–99)
Glucose-Capillary: 210 mg/dL — ABNORMAL HIGH (ref 70–99)
Glucose-Capillary: 216 mg/dL — ABNORMAL HIGH (ref 70–99)
Glucose-Capillary: 232 mg/dL — ABNORMAL HIGH (ref 70–99)
Glucose-Capillary: 275 mg/dL — ABNORMAL HIGH (ref 70–99)

## 2021-05-13 MED ORDER — MIRTAZAPINE 15 MG PO TABS
30.0000 mg | ORAL_TABLET | Freq: Once | ORAL | Status: AC
  Administered 2021-05-13: 30 mg via ORAL
  Filled 2021-05-13: qty 2

## 2021-05-13 MED ORDER — INSULIN GLARGINE-YFGN 100 UNIT/ML ~~LOC~~ SOLN
10.0000 [IU] | Freq: Two times a day (BID) | SUBCUTANEOUS | Status: DC
  Administered 2021-05-13 – 2021-05-14 (×2): 10 [IU] via SUBCUTANEOUS
  Filled 2021-05-13 (×2): qty 0.1

## 2021-05-13 NOTE — Evaluation (Signed)
Physical Therapy One Time Evaluation Patient Details Name: [name] [name] MRN: [id number] DOB: November 06, 1960 Today's Date: 05/13/2021  History of Present Illness  60 y.o. male with medical history significant of pancreatic cancer, recent admission to the hospital. Pt continued to have issues with postprandial pain, nausea, vomiting, distention and readmitted 05/12/21 for AKI due to dehydration.  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 All education has been completed and the patient has no further questions.  Pt ambulated in hallway and mildly unsteady however declined wearing his prosthetic shoe (hx of left transmetatarsal amputation).  Pt agreeable to ambulate with nursing wearing his shoes (for improved stability) during acute stay.  See below for any follow-up Physical Therapy or equipment needs. PT is signing off. Thank you for this referral.      Recommendations for follow up therapy are one component of a multi-disciplinary discharge planning process, led by the attending physician.  Recommendations may be updated based on patient status, additional functional criteria and insurance authorization.  Follow Up Recommendations No PT follow up    Equipment Recommendations  None recommended by PT    Recommendations for Other Services       Precautions / Restrictions Precautions Precautions: Fall Restrictions Weight Bearing Restrictions: No Other Position/Activity Restrictions: has special shoe for left transmet amputation      Mobility  Bed Mobility Overal bed mobility: Modified Independent                  Transfers Overall transfer level: Modified independent                  Ambulation/Gait Ambulation/Gait assistance: Min guard;Supervision Gait Distance (Feet): 400 Feet Assistive device: IV Pole Gait  Pattern/deviations: Step-through pattern;Decreased stride length     General Gait Details: occasional unsteadiness however no overt LOB, pt declined wearing prosthetic shoe (hx of left transmet amputation), pt denies symptoms  Stairs            Wheelchair Mobility    Modified Rankin (Stroke Patients Only)       Balance Overall balance assessment: History of Falls                                           Pertinent Vitals/Pain Pain Assessment: No/denies pain    Home Living Family/patient expects to be discharged to:: Private residence Living Arrangements: Alone   Type of Home: House Home Access: Stairs to enter Entrance Stairs-Rails: Right Entrance Stairs-Number of Steps: 3 Home Layout: One level Home Equipment: None      Prior Function Level of Independence: Independent               Hand Dominance        Extremity/Trunk Assessment        Lower Extremity Assessment Lower Extremity Assessment: Overall WFL for tasks assessed    Cervical / Trunk Assessment Cervical / Trunk Assessment: Normal  Communication   Communication: No difficulties  Cognition Arousal/Alertness: Awake/alert Behavior During Therapy: WFL for tasks assessed/performed Overall Cognitive Status: Within Functional Limits for tasks assessed  General Comments      Exercises     Assessment/Plan    PT Assessment Patent does not need any further PT services  PT Problem List         PT Treatment Interventions      PT Goals (Current goals can be found in the Care Plan section)  Acute Rehab PT Goals PT Goal Formulation: All assessment and education complete, DC therapy    Frequency     Barriers to discharge        Co-evaluation               AM-PAC PT "6 Clicks" Mobility  Outcome Measure Help needed turning from your back to your side while in a flat bed without using bedrails?:  None Help needed moving from lying on your back to sitting on the side of a flat bed without using bedrails?: None Help needed moving to and from a bed to a chair (including a wheelchair)?: None Help needed standing up from a chair using your arms (e.g., wheelchair or bedside chair)?: None Help needed to walk in hospital room?: A Little Help needed climbing 3-5 steps with a railing? : A Little 6 Click Score: 22    End of Session Equipment Utilized During Treatment: Gait belt Activity Tolerance: Patient tolerated treatment well Patient left: in chair;with call bell/phone within reach;with chair alarm set Nurse Communication: Mobility status PT Visit Diagnosis: Difficulty in walking, not elsewhere classified (R26.2)    Time: [id number] PT Time Calculation (min) (ACUTE ONLY): 12 min   Charges:   PT Evaluation $PT Eval Low Complexity: 1 Low        Kati PT, DPT Acute Rehabilitation Services Pager: [phone] Office: [phone]   Myrtis Hopping [name] 05/13/2021, 11:41 AM

## 2021-05-13 NOTE — TOC Initial Note (Signed)
Transition of Care [facility]) - Initial/Assessment Note   Patient Details  Name: [name] [name] MRN: [id number] Date of Birth: 12-Sep-1960  Transition of [name]) CM/SW Contact:    Sherie Don, LCSW Phone Number: 05/13/2021, 3:22 PM  Clinical Narrative: TOC received consult for SNF, but patient walked 400 feet with PT. Hospitalist requested TOC set up [place] for medication management if possible. CSW spoke with patient, who provided CSW with his current address ([place], [place] [address]) as patient no longer resides at the [facility]. CSW made charity [facility] referral to [facility] with [facility] for medication management, which is currently under review and has not been accepted or denied. TOC to follow.  Expected Discharge Plan: [place] Barriers to Discharge: Inadequate or no insurance, Continued Medical Work up  Patient Goals and CMS Choice CMS Medicare.gov Compare Post Acute Care list provided to:: Patient Choice offered to / list presented to : Patient  Expected Discharge Plan and Services Expected Discharge Plan: [place] In-house Referral: Clinical Social Work Post Acute Care Choice: [place] arrangements for the past 2 months: Apartment             DME Arranged: N/A DME Agency: NA  Prior Living Arrangements/Services Living arrangements for the past 2 months: Apartment Lives with:: Self Patient language and need for interpreter reviewed:: Yes Do you feel safe going back to the place where you live?: Yes      Need for Family Participation in Patient Care: No (Comment) Care giver support system in place?: Yes (comment) Current home services: DME [facility]) Criminal Activity/Legal Involvement Pertinent to Current Situation/Hospitalization: No - Comment as needed  Activities of Daily Living Home Assistive Devices/Equipment: Eyeglasses ADL Screening (condition at time of admission) Patient's cognitive ability adequate to safely  complete daily activities?: Yes Is the patient deaf or have difficulty hearing?: No Does the patient have difficulty seeing, even when wearing glasses/contacts?: No Does the patient have difficulty concentrating, remembering, or making decisions?: No Patient able to express need for assistance with ADLs?: Yes Does the patient have difficulty dressing or bathing?: No Independently performs ADLs?: Yes (appropriate for developmental age) Does the patient have difficulty walking or climbing stairs?: Yes Weakness of Legs: Both Weakness of Arms/Hands: Both  Permission Sought/Granted Permission sought to share information with : Other (comment) Permission granted to share information with : Yes, Verbal Permission Granted Permission granted to share info w AGENCY: Alvis Lemmings for charity [facility]  Emotional Assessment Attitude/Demeanor/Rapport: Engaged Affect (typically observed): Accepting Orientation: : Oriented to Self, Oriented to Place, Oriented to  Time, Oriented to Situation Alcohol / Substance Use: Tobacco Use  Admission diagnosis:  Dehydration [E86.0] AKI (acute kidney injury) ([place]) [N17.9] Abdominal pain [R10.9] Intractable vomiting without nausea, unspecified vomiting type [R11.11] Patient Active Problem List   Diagnosis Date Noted   AKI (acute kidney injury) ([place]) 05/12/2021   Abdominal distension    Gastroparesis    Gastric outlet obstruction 05/05/2021   Abdominal pain 05/05/2021   Dehydration 05/05/2021   Nausea & vomiting 05/04/2021   Port-A-Cath in place 04/24/2021   Malnutrition of moderate degree 03/27/2021   Hyperglycemia due to diabetes mellitus ([place]) 03/26/2021   Pancreatic cancer ([place]) 03/12/2021   Malignant neoplasm of head of pancreas ([place]) 03/12/2021   Jaundice 02/26/2021   Loss of weight 02/26/2021   S/P transmetatarsal amputation of foot, left ([place]) 11/28/2020   Hypertension associated with type 2 diabetes mellitus ([place]) 11/28/2020   Type  2 diabetes mellitus  with diabetic neuropathy, unspecified ([place]) 11/28/2020   Hyperlipidemia associated with type 2 diabetes mellitus ([place]) 11/28/2020   GERD without esophagitis 11/28/2020   Major depression, recurrent, chronic ([place]) 11/28/2020   Aortic atherosclerosis ([place]) 11/28/2020   Diabetic neuropathy ([place]) 11/28/2020   Osteomyelitis ([place]) 11/14/2020   Sepsis ([place]) 10/28/2020   Community acquired pneumonia of right lower lobe of lung    Elevated LFTs    Hypophosphatemia    Acute metabolic encephalopathy    Acute maxillary sinusitis    Depression    Physical deconditioning    Pneumonia 05/14/2020   DTs (delirium tremens) ([place]) 05/13/2020   Hyponatremia 05/13/2020   Hypokalemia 05/13/2020   SIRS (systemic inflammatory response syndrome) ([place]) 05/13/2020   Sepsis with acute hypoxic respiratory failure without septic shock ([place])    Alcohol abuse with alcohol-induced mood disorder ([place]) 12/17/2017   Hereditary hemochromatosis ([place]) 11/23/2017   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   Essential hypertension 04/02/2017   Hypogonadism male 08/13/2016   ETOH abuse 06/22/2016   Low serum testosterone level 01/08/2016   Type 2 diabetes mellitus with other specified complication ([place]) [id number]   Erectile dysfunction 09/12/2015   Hepatic cirrhosis ([place]) 01/10/2015   Chronic hepatitis C without hepatic coma ([place]) 10/31/2014   Tobacco use disorder 08/03/2013   Diabetes type 2, controlled ([place]) 04/27/2013   PCP:  [name], NP Pharmacy:   [facility] and [place] [address] [place] [address] Phone: [phone] Fax: [place], [place] [address] [place] [address] Phone: [phone] Fax: [phone]  [place] [address] Phone: [phone] Fax: [place] [address]  [place] [address] Phone: [phone] Fax: [phone]  Readmission Risk Interventions Readmission Risk Prevention Plan 03/27/2021 11/14/2020  Transportation Screening Complete Complete  PCP or Specialist Appt within 3-5 Days Complete -  HRI or [place] Complete Complete  Social Work Consult for [place] Planning/Counseling Complete Complete  Palliative Care Screening Not Applicable Not Applicable  Medication Review Press photographer) Complete Complete  Some recent data might be hidden

## 2021-05-13 NOTE — Progress Notes (Signed)
PROGRESS NOTE    [name] [name]  [id number] DOB: May 08, 1961 DOA: 05/12/2021 PCP: [name], NP     Brief Narrative:  [name] [name] is a 60 y.o. male with medical history significant of pancreatic cancer, recent admission to the hospital, discharged 3 days ago.  Since then, continued to have issues with postprandial pain, nausea, vomiting, distention.  No fevers.  Decreased urine output.  He had EGD on 9/6 showing grade C esophagitis without bleeding and large amount of food residue in the stomach without evidence of gastric outlet obstruction.  Thought to be secondary to mass-effect from pancreatic mass contributing to the abdominal pain.  Patient now returns with hypotension, hyponatremia, AKI and elevated blood sugar.  New events last 24 hours / Subjective: Patient has no physical complaints, denies any abdominal distention or pain.  States that he feels better.  States that he has had no appetite with abdominal fullness at home.  Thinks that he could drink some liquids today.  I discussed his mom's request for palliative care consultation, he is in agreement to meet with them during hospitalization for goals of care conversation.  Assessment & Plan:   Active Problems:   Diabetes type 2, controlled ([place])   Hyponatremia   Pancreatic cancer (HCC)   Dehydration   Gastroparesis   AKI (acute kidney injury) ([place])   Gastroparesis -Recent EGD without gastric outlet obstruction, seen by [facility] in the past -Continue IV Reglan -Improved, no nausea, vomiting or abdominal pain or distention.  Hold off on GI consultation as patient has had improvement in her symptoms -Advance to clear liquid diet  AKI -Resolved with IV fluid  Diabetes mellitus with hyperglycemia -Adjusted dose of Semglee  Hyponatremia -Resolved with IV fluid  Leukocytosis, thrombocytosis -Without evidence of infection, could be secondary to dehydration and volume contraction -Improved with IV  fluid  DVT prophylaxis:  heparin injection 5,000 Units Start: 05/12/21 2200 SCDs Start: 05/12/21 2131  Code Status:     Code Status Orders  (From admission, onward)           Start     Ordered   05/12/21 2131  Full code  Continuous        05/12/21 2130           Code Status History     Date Active Date Inactive Code Status Order ID Comments User Context   05/04/2021 2144 05/08/2021 2014 Full Code [id number]  Rhetta Mura, DO ED   03/26/2021 1355 03/28/2021 2235 Full Code [id number]  Collier Bullock, MD ED   11/13/2020 2127 11/26/2020 1317 DNR [id number]  Rise Patience, MD ED   10/28/2020 1226 11/01/2020 2052 DNR [id number]  Harold Hedge, MD ED   05/13/2020 0107 05/21/2020 1406 Full Code [id number]  Mikki Harbor, MD ED   12/16/2017 1708 12/17/2017 1527 Full Code [id number]  Domenic Moras, PA-C ED   02/15/2014 1650 02/16/2014 1928 Full Code [id number]  Zehr, Laban Emperor., PA-C Inpatient      Family Communication: No family at bedside, called mom but no answer  Disposition Plan:  Status is: Observation  The patient will require care spanning > 2 midnights and should be moved to inpatient because: IV treatments appropriate due to intensity of illness or inability to take PO  Dispo: The patient is from: Home              Anticipated d/c is to: Home  Patient currently is not medically stable to d/c.   Difficult to place patient No      Consultants:  Palliative care medicine   Procedures:  None  Antimicrobials:  Anti-infectives (From admission, onward)    None        Objective: Vitals:   05/12/21 2000 05/12/21 2209 05/13/21 0157 05/13/21 0417  BP: 110/67 110/62 122/71 131/78  Pulse: 98 84 76 73  Resp: '17 16 18 17  '$ Temp:  98.2 F (36.8 C) 97.9 F (36.6 C) 98.1 F (36.7 C)  TempSrc:  Oral    SpO2: 94% 94% 97% 100%  Weight:      Height:        Intake/Output Summary (Last 24 hours) at 05/13/2021 1328 Last data filed at 05/13/2021  0829 Gross per 24 hour  Intake 1130.24 ml  Output 520 ml  Net 610.24 ml   Filed Weights   05/12/21 1505  Weight: 65.8 kg    Examination:  General exam: Appears calm and comfortable  Respiratory system: Clear to auscultation. Respiratory effort normal. No respiratory distress. No conversational dyspnea.  Cardiovascular system: S1 & S2 heard, RRR. No murmurs. No pedal edema. Gastrointestinal system: Abdomen is nondistended, soft and nontender. Normal bowel sounds heard. Central nervous system: Alert and oriented. No focal neurological deficits. Speech clear.  Extremities: Symmetric in appearance  Skin: No rashes, lesions or ulcers on exposed skin  Psychiatry:  Mood & affect appropriate.   Data Reviewed: I have personally reviewed following labs and imaging studies  CBC: Recent Labs  Lab 05/07/21 0500 05/12/21 1558 05/13/21 0311  WBC 10.2 19.5* 14.8*  NEUTROABS  --  14.4*  --   HGB 12.0* 11.9* 10.5*  HCT 35.6* 34.8* 30.5*  MCV 91.0 89.0 88.2  PLT 174 469* [id number]   Basic Metabolic Panel: Recent Labs  Lab 05/07/21 0500 05/08/21 0517 05/12/21 1557 05/12/21 1900 05/13/21 0311  NA 131* 140 131* 139 136  K 3.7 3.8 3.8 2.3* 3.7  CL 97* 107 76* 103 96*  CO2 '22 25 28 24 30  '$ GLUCOSE 168* 140* 476* 299* 176*  BUN 11 7 60* 41* 36*  CREATININE 0.43* 0.41* 1.61* 0.76 0.58*  CALCIUM 8.4* 9.0 8.7* 6.2* 8.7*  MG 1.9  --   --  1.3*  --   PHOS 2.3*  --   --   --   --    GFR: Estimated Creatinine Clearance: 93.7 mL/min (A) (by C-G formula based on SCr of 0.58 mg/dL (L)). Liver Function Tests: Recent Labs  Lab 05/07/21 0500 05/12/21 1557 05/13/21 0311  AST  --  20 13*  ALT  --  13 10  ALKPHOS  --  76 71  BILITOT  --  2.0* 0.7  PROT  --  7.2 6.3*  ALBUMIN 3.2* 3.2* 2.7*   Recent Labs  Lab 05/12/21 1557  LIPASE 17   No results for input(s): AMMONIA in the last 168 hours. Coagulation Profile: No results for input(s): INR, PROTIME in the last 168 hours. Cardiac  Enzymes: No results for input(s): CKTOTAL, CKMB, CKMBINDEX, TROPONINI in the last 168 hours. BNP (last 3 results) No results for input(s): PROBNP in the last 8760 hours. HbA1C: No results for input(s): HGBA1C in the last 72 hours. CBG: Recent Labs  Lab 05/12/21 1959 05/13/21 0005 05/13/21 0414 05/13/21 0745 05/13/21 1215  GLUCAP 334* 210* 179* 181* 216*   Lipid Profile: No results for input(s): CHOL, HDL, LDLCALC, TRIG, CHOLHDL, LDLDIRECT in the last 72 hours.  Thyroid Function Tests: No results for input(s): TSH, T4TOTAL, FREET4, T3FREE, THYROIDAB in the last 72 hours. Anemia Panel: No results for input(s): VITAMINB12, FOLATE, FERRITIN, TIBC, IRON, RETICCTPCT in the last 72 hours. Sepsis Labs: Recent Labs  Lab 05/12/21 1518  LATICACIDVEN 1.4    Recent Results (from the past 240 hour(s))  Culture, blood (routine x 2)     Status: None   Collection Time: 05/04/21  7:19 PM   Specimen: BLOOD  Result Value Ref Range Status   Specimen Description   Final    BLOOD LEFT ANTECUBITAL Performed at [facility] [address] [place], [place] [address]    Special Requests   Final    BOTTLES DRAWN AEROBIC AND ANAEROBIC Blood Culture results may not be optimal due to an excessive volume of blood received in culture bottles Performed at [facility] [address] [place], [place] [address]    Culture   Final    NO GROWTH 5 DAYS Performed at [facility], [place] [address] [place], [place] [address]    Report Status 05/10/2021 FINAL  Final  Culture, blood (routine x 2)     Status: None   Collection Time: 05/04/21  7:19 PM   Specimen: BLOOD  Result Value Ref Range Status   Specimen Description   Final    BLOOD BLOOD LEFT HAND Performed at [facility] [address] [place], [place] [address]    Special Requests   Final    BOTTLES DRAWN AEROBIC AND ANAEROBIC Blood Culture adequate volume Performed at [facility] [address], [place], [place] [address]    Culture   Final    NO GROWTH 5 DAYS Performed at [facility], [place] [address] [place], [place] [address]    Report Status 05/10/2021 FINAL  Final  Resp Panel by RT-PCR (Flu A&B, Covid) Nasopharyngeal Swab     Status: None   Collection Time: 05/04/21  9:28 PM   Specimen: Nasopharyngeal Swab; Nasopharyngeal(NP) swabs in vial transport medium  Result Value Ref Range Status   SARS Coronavirus 2 by RT PCR NEGATIVE NEGATIVE Final    Comment: (NOTE) SARS-CoV-2 target nucleic acids are NOT DETECTED.  The SARS-CoV-2 RNA is generally detectable in upper respiratory specimens during the acute phase of infection. The lowest concentration of SARS-CoV-2 viral copies this assay can detect is 138 copies/mL. A negative result does not preclude SARS-Cov-2 infection and should not be used as the sole basis for treatment or other patient management decisions. A negative result may occur with  improper specimen collection/handling, submission of specimen other than nasopharyngeal swab, presence of viral mutation(s) within the areas targeted by this assay, and inadequate number of viral copies(<138 copies/mL). A negative result must be combined with clinical observations, patient history, and epidemiological information. The expected result is Negative.  Fact Sheet for Patients:  EntrepreneurPulse.com.au  Fact Sheet for Healthcare Providers:  IncredibleEmployment.be  This test is no t yet approved or cleared by the [place] FDA and  has been authorized for detection and/or diagnosis of SARS-CoV-2 by FDA under an Emergency Use Authorization (EUA). This EUA will remain  in effect (meaning this test can be used) for the duration of the COVID-19 declaration under Section 564(b)(1) of the Act, 21 U.S.C.section 360bbb-3(b)(1), unless the authorization is terminated  or revoked sooner.        Influenza A by PCR NEGATIVE NEGATIVE Final   Influenza B by  PCR NEGATIVE NEGATIVE Final    Comment: (NOTE) The Xpert Xpress SARS-CoV-2/FLU/RSV plus assay is intended as an aid in the diagnosis of influenza from Nasopharyngeal swab specimens and should not be used as a sole basis for treatment. Nasal washings and aspirates are unacceptable for Xpert Xpress SARS-CoV-2/FLU/RSV testing.  Fact Sheet for Patients: EntrepreneurPulse.com.au  Fact Sheet for Healthcare Providers: IncredibleEmployment.be  This test is not yet approved or cleared by the [place] FDA and has been authorized for detection and/or diagnosis of SARS-CoV-2 by FDA under an Emergency Use Authorization (EUA). This EUA will remain in effect (meaning this test can be used) for the duration of the COVID-19 declaration under Section 564(b)(1) of the Act, 21 U.S.C. section 360bbb-3(b)(1), unless the authorization is terminated or revoked.  Performed at [facility], [place] [address] [place], [place] [address]   Resp Panel by RT-PCR (Flu A&B, Covid) Nasopharyngeal Swab     Status: None   Collection Time: 05/12/21  8:03 PM   Specimen: Nasopharyngeal Swab; Nasopharyngeal(NP) swabs in vial transport medium  Result Value Ref Range Status   SARS Coronavirus 2 by RT PCR NEGATIVE NEGATIVE Final    Comment: (NOTE) SARS-CoV-2 target nucleic acids are NOT DETECTED.  The SARS-CoV-2 RNA is generally detectable in upper respiratory specimens during the acute phase of infection. The lowest concentration of SARS-CoV-2 viral copies this assay can detect is 138 copies/mL. A negative result does not preclude SARS-Cov-2 infection and should not be used as the sole basis for treatment or other patient management decisions. A negative result may occur with  improper specimen collection/handling, submission of specimen other than nasopharyngeal swab, presence of viral  mutation(s) within the areas targeted by this assay, and inadequate number of viral copies(<138 copies/mL). A negative result must be combined with clinical observations, patient history, and epidemiological information. The expected result is Negative.  Fact Sheet for Patients:  EntrepreneurPulse.com.au  Fact Sheet for Healthcare Providers:  IncredibleEmployment.be  This test is no t yet approved or cleared by the [place] FDA and  has been authorized for detection and/or diagnosis of SARS-CoV-2 by FDA under an Emergency Use Authorization (EUA). This EUA will remain  in effect (meaning this test can be used) for the duration of the COVID-19 declaration under Section 564(b)(1) of the Act, 21 U.S.C.section 360bbb-3(b)(1), unless the authorization is terminated  or revoked sooner.       Influenza A by PCR NEGATIVE NEGATIVE Final   Influenza B by PCR NEGATIVE NEGATIVE Final    Comment: (NOTE) The Xpert Xpress SARS-CoV-2/FLU/RSV plus assay is intended as an aid in the diagnosis of influenza from Nasopharyngeal swab specimens and should not be used as a sole basis for treatment. Nasal washings and aspirates are unacceptable for Xpert Xpress SARS-CoV-2/FLU/RSV testing.  Fact Sheet for Patients: EntrepreneurPulse.com.au  Fact Sheet for Healthcare Providers: IncredibleEmployment.be  This test is not yet approved or cleared by the [place] FDA and has been authorized for detection and/or diagnosis of SARS-CoV-2 by FDA under an Emergency Use Authorization (EUA). This EUA will remain in effect (meaning this test can be used) for the duration of the COVID-19 declaration under Section 564(b)(1) of the Act, 21 U.S.C. section 360bbb-3(b)(1), unless the authorization is terminated or revoked.  Performed at [facility], [place] [address], [place],  [address]       Radiology  Studies: DG Abd 2 Views  Result Date: 05/12/2021 CLINICAL DATA:  Abdominal pain. EXAM: ABDOMEN - 2 VIEW COMPARISON:  Abdominal x-ray 05/08/2021. CT abdomen and pelvis 05/04/2021. FINDINGS: The stomach appears markedly dilated with air-fluid level. There are no dilated small bowel loops or colon. Colonic air is seen to the level the rectum. Stent is again noted in the right abdomen. There is a phlebolith in the left hemipelvis. Vascular calcifications are again noted in the left pelvis. There is no free intraperitoneal air under the diaphragm. Branching air in the right upper quadrant corresponds to pneumobilia seen on recent CT. No acute fractures are seen. There are degenerative changes of the spine and hips. IMPRESSION: 1. The stomach is dilated with large air-fluid level. This appears similar to the prior study. 2. Small bowel and colon are nondilated. Electronically Signed   By: Ronney Asters M.D.   On: 05/12/2021 16:15      Scheduled Meds:  heparin  5,000 Units Subcutaneous Q8H   insulin aspart  0-15 Units Subcutaneous Q4H   insulin glargine-yfgn  10 Units Subcutaneous BID   metoCLOPramide (REGLAN) injection  10 mg Intravenous Q6H   Continuous Infusions:  sodium chloride 100 mL/hr at 05/13/21 0600     LOS: 0 days      Time spent: 30 minutes   Dessa Phi, DO [facility] 05/13/2021, 1:28 PM   Available via Epic secure chat 7am-7pm After these hours, please refer to coverage provider listed on [email or web address]

## 2021-05-13 NOTE — Progress Notes (Signed)
Inpatient Diabetes Program Recommendations  AACE/ADA: New Consensus Statement on Inpatient Glycemic Control (2015)  Target Ranges:  Prepandial:   less than 140 mg/dL      Peak postprandial:   less than 180 mg/dL (1-2 hours)      Critically ill patients:  140 - 180 mg/dL   Lab Results  Component Value Date   GLUCAP 181 (H) 05/13/2021   HGBA1C 9.6 (H) 04/28/2021    Review of Glycemic Control Results for [name] [name], [name] [name] "HARVEY" (MRN [id number]) as of 05/13/2021 09:53  Ref. Range 05/12/2021 19:59 05/13/2021 00:05 05/13/2021 04:14 05/13/2021 07:45  Glucose-Capillary Latest Ref Range: 70 - 99 mg/dL 334 (H) 210 (H) 179 (H) 181 (H)   Diabetes history: Type 2 Dm Outpatient Diabetes medications: Humalog 0-9 units TID, Lantus 25 units QA, 20 units QP Current orders for Inpatient glycemic control: Semglee 10 units QHS, Novolog 0-15 units Q4H  Inpatient Diabetes Program Recommendations:    Consider increasing Semglee to 10 units BID.   Thanks, Bronson Curb, MSN, RNC-OB Diabetes Coordinator [phone] (8a-5p)

## 2021-05-14 ENCOUNTER — Encounter: Payer: Self-pay | Admitting: Hematology and Oncology

## 2021-05-14 ENCOUNTER — Telehealth: Payer: Self-pay | Admitting: Hematology and Oncology

## 2021-05-14 ENCOUNTER — Other Ambulatory Visit: Payer: Self-pay | Admitting: *Deleted

## 2021-05-14 ENCOUNTER — Other Ambulatory Visit: Payer: Self-pay

## 2021-05-14 ENCOUNTER — Ambulatory Visit: Payer: Medicaid Other | Admitting: Nurse Practitioner

## 2021-05-14 ENCOUNTER — Telehealth: Payer: Self-pay | Admitting: Nurse Practitioner

## 2021-05-14 DIAGNOSIS — K3184 Gastroparesis: Secondary | ICD-10-CM

## 2021-05-14 DIAGNOSIS — C250 Malignant neoplasm of head of pancreas: Secondary | ICD-10-CM

## 2021-05-14 LAB — BASIC METABOLIC PANEL
Anion gap: 7 (ref 5–15)
BUN: 12 mg/dL (ref 6–20)
CO2: 26 mmol/L (ref 22–32)
Calcium: 8.8 mg/dL — ABNORMAL LOW (ref 8.9–10.3)
Chloride: 106 mmol/L (ref 98–111)
Creatinine, Ser: 0.35 mg/dL — ABNORMAL LOW (ref 0.61–1.24)
GFR, Estimated: >60 mL/min (ref >60)
Glucose, Bld: 72 mg/dL (ref 70–99)
Potassium: 3.7 mmol/L (ref 3.5–5.1)
Sodium: 139 mmol/L (ref 135–145)

## 2021-05-14 LAB — CBC
HCT: 30.3 % — ABNORMAL LOW (ref 39.0–52.0)
Hemoglobin: 10.3 g/dL — ABNORMAL LOW (ref 13.0–17.0)
MCH: 30.1 pg (ref 26.0–34.0)
MCHC: 34 g/dL (ref 30.0–36.0)
MCV: 88.6 fL (ref 80.0–100.0)
Platelets: 349 10*3/uL (ref 150–400)
RBC: 3.42 MIL/uL — ABNORMAL LOW (ref 4.22–5.81)
RDW: 13.6 % (ref 11.5–15.5)
WBC: 11.9 10*3/uL — ABNORMAL HIGH (ref 4.0–10.5)
nRBC: 0 % (ref 0.0–0.2)

## 2021-05-14 LAB — GLUCOSE, CAPILLARY
Glucose-Capillary: 78 mg/dL (ref 70–99)
Glucose-Capillary: 125 mg/dL — ABNORMAL HIGH (ref 70–99)
Glucose-Capillary: 130 mg/dL — ABNORMAL HIGH (ref 70–99)

## 2021-05-14 MED ORDER — LANTUS SOLOSTAR 100 UNIT/ML ~~LOC~~ SOPN
10.0000 [IU] | PEN_INJECTOR | Freq: Every day | SUBCUTANEOUS | 0 refills | Status: DC
  Filled 2021-05-14: qty 15, 150d supply, fill #0

## 2021-05-14 MED ORDER — LANTUS SOLOSTAR 100 UNIT/ML ~~LOC~~ SOPN
10.0000 [IU] | PEN_INJECTOR | Freq: Two times a day (BID) | SUBCUTANEOUS | 0 refills | Status: DC
  Filled 2021-05-14: qty 15, 75d supply, fill #0

## 2021-05-14 NOTE — Telephone Encounter (Signed)
Scheduled per Dr. Libby Maw orders, patient has been called and voicemail was left.

## 2021-05-14 NOTE — Progress Notes (Signed)
[name] [name] signed on to his video visit however he is still admitted. States he will be discharged later today. Has an appt with GI tomorrow at 4pm which I did remind him of.  He had questions regarding his humalog insulin. He has a pen with 200 units and a pen with 100 units. All questions were answered and he verbalized understanding.

## 2021-05-14 NOTE — Telephone Encounter (Signed)
[name] [name] is currently admitted to the hospital.

## 2021-05-14 NOTE — Evaluation (Signed)
Occupational Therapy Evaluation Patient Details Name: [name] [name] MRN: [id number] DOB: 1961/08/14 Today's Date: 05/14/2021   History of Present Illness 60 y.o. male with medical history significant of pancreatic cancer, recent admission to the hospital. Pt continued to have issues with postprandial pain, nausea, vomiting, distention and readmitted 05/12/21 for AKI due to dehydration.  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 Patient demonstrate independence with lower body dressing, functional ambulation and transfers without any assistance. Is hopeful to go home today. No further acute OT needs will sign off.       Recommendations for follow up therapy are one component of a multi-disciplinary discharge planning process, led by the attending physician.  Recommendations may be updated based on patient status, additional functional criteria and insurance authorization.   Follow Up Recommendations  No OT follow up    Equipment Recommendations  None recommended by OT       Precautions / Restrictions Restrictions Weight Bearing Restrictions: No Other Position/Activity Restrictions: has special shoe for left transmet amputation      Mobility Bed Mobility Overal bed mobility: Modified Independent                  Transfers Overall transfer level: Independent                    Balance Overall balance assessment: History of Falls                                         ADL either performed or assessed with clinical judgement   ADL Overall ADL's : Independent                                       General ADL Comments: patient is able to perform lower body dressing, functional transfers and ambulation in room without any physical assistance      Pertinent Vitals/Pain Pain  Assessment: No/denies pain     Hand Dominance Right   Extremity/Trunk Assessment Upper Extremity Assessment Upper Extremity Assessment: Overall WFL for tasks assessed   Lower Extremity Assessment Lower Extremity Assessment: Defer to PT evaluation   Cervical / Trunk Assessment Cervical / Trunk Assessment: Normal   Communication Communication Communication: No difficulties   Cognition Arousal/Alertness: Awake/alert Behavior During Therapy: WFL for tasks assessed/performed Overall Cognitive Status: Within Functional Limits for tasks assessed                                                Home Living Family/patient expects to be discharged to:: Private residence Living Arrangements: Alone Available Help at Discharge: Friend(s);Available PRN/intermittently Type of Home: House Home Access: Stairs to enter [employer] of Steps: 3 Entrance Stairs-Rails: Right Home Layout: One level     Bathroom Shower/Tub: Occupational psychologist: Standard     Home Equipment: Grab bars - tub/shower          Prior Functioning/Environment Level of Independence: Independent                 OT  Problem List: Decreased activity tolerance         OT Goals(Current goals can be found in the care plan section) Acute Rehab OT Goals Patient Stated Goal: home today hopefully OT Goal Formulation: All assessment and education complete, DC therapy   AM-PAC OT "6 Clicks" Daily Activity     Outcome Measure Help from another person eating meals?: None Help from another person taking care of personal grooming?: None Help from another person toileting, which includes using toliet, bedpan, or urinal?: None Help from another person bathing (including washing, rinsing, drying)?: None Help from another person to put on and taking off regular upper body clothing?: None Help from another person to put on and taking off regular lower body clothing?: None 6  Click Score: 24   End of Session Nurse Communication: Other (comment) (OT complete)  Activity Tolerance: Patient tolerated treatment well Patient left: in bed;with call bell/phone within reach  OT Visit Diagnosis: Other abnormalities of gait and mobility (R26.89)                [id number] OT Time Calculation (min): 10 min Charges:  OT General Charges $OT Visit: 1 Visit OT Evaluation $OT Eval Low Complexity: 1 Low  Delbert Phenix OT OT pager: [place] 05/14/2021, 1:45 PM

## 2021-05-14 NOTE — Discharge Instructions (Signed)
[name] [name],  You were in the hospital with nausea/vomiting which may be related to your gastroparesis. This was treated with antinausea medication with improvement. Your Lantus has been decreased because of hypoglycemia (low blood sugar) while in the hospital. Once your intake increases, this may need to be increased back to your previous dose of 20 units. Please follow-up with your primary care physician.

## 2021-05-14 NOTE — Telephone Encounter (Signed)
Dr. Havery Moros, just an fyi. Thanks  Patient did go to the ED on 05/12/21. No return call received.

## 2021-05-14 NOTE — TOC Transition Note (Signed)
Transition of Care [facility]) - CM/SW Discharge Note  Patient Details  Name: [name] [name] MRN: [id number] Date of Birth: 1961-04-19  Transition of Care [facility]) CM/SW Contact:  Sherie Don, LCSW Phone Number: 05/14/2021, 1:54 PM  Clinical Narrative: Alvis Lemmings accepted patient for charity [facility]. Hospitalist, RN, and patient updated. TOC signing off.  Final next level of care: Home w Home Health Services Barriers to Discharge: Barriers Resolved  Patient Goals and CMS Choice CMS Medicare.gov Compare Post Acute Care list provided to:: Patient Choice offered to / list presented to : Patient  Discharge Plan and Services In-house Referral: Clinical Social Work Post Acute Care Choice: Home Health          DME Arranged: N/A DME Agency: NA HH Arranged: RN [place] Agency: [place] Date [facility] Agency Contacted: 05/14/21 Representative spoke with at [place]: [facility]  Readmission Risk Interventions Readmission Risk Prevention Plan 03/27/2021 11/14/2020  Transportation Screening Complete Complete  PCP or Specialist Appt within 3-5 Days Complete -  HRI or Home Care Consult Complete Complete  Social Work Consult for [place] Planning/Counseling Complete Complete  Palliative Care Screening Not Applicable Not Applicable  Medication Review Press photographer) Complete Complete  Some recent data might be hidden

## 2021-05-14 NOTE — Plan of Care (Signed)
  Problem: Coping: Goal: Level of anxiety will decrease Outcome: Progressing   Problem: Pain Managment: Goal: General experience of comfort will improve Outcome: Progressing   

## 2021-05-14 NOTE — Discharge Summary (Signed)
Physician Discharge Summary  [name] [name] [id number] DOB: 13-Jun-1961 DOA: 05/12/2021  PCP: [name] Pounds, NP  Admit date: 05/12/2021 Discharge date: 05/14/2021  Admitted From: Home Disposition: Home  Recommendations for Outpatient Follow-up:  Follow up with PCP in 1 week Please obtain BMP/CBC in one week Please follow up on the following pending results: None  Home Health: None Equipment/Devices: None  Discharge Condition: Stable CODE STATUS: Full code Diet recommendation: Carb modified  Brief/Interim Summary:  Admission HPI written by [name] Phi, DO   HPI: [name] [name] is a 60 y.o. male with medical history significant of pancreatic cancer, recent admission to the hospital, discharged 3 days ago.  Since then, continued to have issues with postprandial pain, nausea, vomiting, distention.  No fevers.  Decreased urine output.  He had EGD on 9/6 showing grade C esophagitis without bleeding and large amount of food residue in the stomach without evidence of gastric outlet obstruction.  Thought to be secondary to mass-effect from pancreatic mass contributing to the abdominal pain.   He was instructed to eat small/frequent meals.   He states he fell at home. Mother reported to a nurse that he lives alone and his source of water is his garden hose.     Hospital course:  Gastroparesis Patient has been evaluated in the past by gastroenterologist with recent EGD showing no gastric outlet obstruction.  Patient started on IV Reglan for treatment of symptoms.  Patient with improved ability to tolerate oral intake.  He was advanced to a soft diet successfully with no recurrent symptoms.  AKI Patient presented with a creatinine of 1.61 on admission which resolved with IV fluids.  Diabetes mellitus type 2 Uncontrolled with a history of hyperglycemia.  Hemoglobin A1c of 9.6% in August 2020.  Patient's home dose of 25 units in a.m. and 20 units at night was decreased to 10  units twice daily secondary to lower blood sugars.  Patient instructed that if his blood sugars improve with improvement of his oral intake to resume previous dose of 25 and 20 units.  Discharge Diagnoses:  Active Problems:   Diabetes type 2, controlled ([name] [name])   Hyponatremia   Pancreatic cancer ([name] [name])   Dehydration   Gastroparesis   AKI (acute kidney injury) ([name] [name])    Discharge Instructions   Allergies as of 05/14/2021       Reactions   Bee Venom Anaphylaxis   Lactose Intolerance (gi) Other (See Comments)   GI upset        Medication List     TAKE these medications    albuterol 108 (90 Base) MCG/ACT inhaler Commonly known as: Ventolin HFA Inhale 2 puffs into the lungs every 6 (six) hours as needed for wheezing or shortness of breath.   atorvastatin 40 MG tablet Commonly known as: LIPITOR Take 40 mg by mouth daily.   diphenoxylate-atropine 2.5-0.025 MG tablet Commonly known as: Lomotil Take 1 tablet by mouth 4 (four) times daily as needed for diarrhea or loose stools.   DULoxetine 60 MG capsule Commonly known as: CYMBALTA Take 1 capsule (60 mg total) by mouth daily.   EPINEPHrine 0.3 mg/0.3 mL Soaj injection Commonly known as: EPI-PEN Inject 0.3 mg into the muscle once as needed for anaphylaxis.   folic acid 1 MG tablet Commonly known as: FOLVITE Take 1 tablet (1 mg total) by mouth in the morning.   HumaLOG KwikPen 100 UNIT/ML KwikPen Generic drug: insulin lispro Inject 0-9 Units into the skin 3 (three) times  daily. Sliding scale CBG 70 - 120: 0 units CBG 121 - 150: 1 unit,  CBG 151 - 200: 2 units,  CBG 201 - 250: 3 units,  CBG 251 - 300: 5 units,  CBG 301 - 350: 7 units,  CBG 351 - 400: 9 units   CBG > 400: 9 units and notify your MD   Lantus SoloStar 100 UNIT/ML Solostar Pen Generic drug: insulin glargine Inject 10 Units into the skin 2 (two) times daily. What changed:  how much to take how to take this when to take this additional instructions    lidocaine-prilocaine cream Commonly known as: EMLA Apply 1 application topically as directed as needed. What changed:  when to take this reasons to take this   lipase/protease/amylase 36000 UNITS Cpep capsule Commonly known as: CREON Take 2 capsules (72,000 Units total) by mouth 3 (three) times daily with meals. May also take 1 capsule (36,000 Units total) as needed (with snacks). NEEDS PASS.   losartan 50 MG tablet Commonly known as: COZAAR Take 1 tablet (50 mg total) by mouth daily.   magic mouthwash w/lidocaine Soln Take 5 mLs by mouth 4 (four) times daily as needed. What changed: reasons to take this   metoCLOPramide 5 MG tablet Commonly known as: Reglan Take 1 tablet (5 mg total) by mouth 4 (four) times daily -  before meals and at bedtime.   mirtazapine 30 MG tablet Commonly known as: REMERON Take 1 tablet (30 mg total) by mouth at bedtime.   omeprazole 40 MG capsule Commonly known as: PRILOSEC Take 1 capsule (40 mg total) by mouth in the morning.   ondansetron 8 MG tablet Commonly known as: Zofran Take 1 tablet (8 mg total) by mouth 2 (two) times daily as needed. Start on day 3 after chemotherapy. What changed: reasons to take this   polyethylene glycol 17 g packet Commonly known as: MIRALAX / GLYCOLAX Take 17 g by mouth at bedtime.   prochlorperazine 10 MG tablet Commonly known as: COMPAZINE Take 1 tablet (10 mg total) by mouth every 6 (six) hours as needed (Nausea or vomiting).   Therems-M Tabs Take 1 tablet by mouth in the morning.   True Metrix Blood Glucose Test test strip Generic drug: glucose blood Use as instructed. Check blood glucose level by fingerstick 3-4 times per day.  E11.65   TRUEplus Lancets 28G Misc Use as instructed. Check blood glucose level by fingerstick 3-4 times per day.   E11.65   TRUEplus Pen Needles 32G X 4 MM Misc Generic drug: Insulin Pen Needle Use as instructed. Inject into the skin twice daily   TechLite Pen Needles  32G X 4 MM Misc Generic drug: Insulin Pen Needle USE AS INSTRUCTED INTO THE SKIN TWICE DAILY        Allergies  Allergen Reactions   Bee Venom Anaphylaxis   Lactose Intolerance (Gi) Other (See Comments)    GI upset    Consultations: None   Procedures/Studies: DG Abd 1 View  Result Date: 05/07/2021 CLINICAL DATA:  Left lower quadrant abdominal pain EXAM: ABDOMEN - 1 VIEW COMPARISON:  CT abdomen/pelvis 03/28/2021 FINDINGS: There is a nonobstructive bowel gas pattern. There is no gross organomegaly or abnormal soft tissue calcification. A biliary stent is noted. There is associated pneumobilia projecting over the liver. There is no acute osseous abnormality. IMPRESSION: No acute abnormality identified. Electronically Signed   By: Valetta Mole M.D.   On: 05/07/2021 14:38   CT Abdomen Pelvis W Contrast  Result  Date: 05/04/2021 CLINICAL DATA:  Diverticulitis suspected abdominal pain Patient reports left lower quadrant pain. Fever. Nausea and vomiting. History of pancreatic cancer, active chemotherapy. EXAM: CT ABDOMEN AND PELVIS WITH CONTRAST TECHNIQUE: Multidetector CT imaging of the abdomen and pelvis was performed using the standard protocol following bolus administration of intravenous contrast. CONTRAST:  55m OMNIPAQUE IOHEXOL 350 MG/ML SOLN COMPARISON:  Most recent CT noncontrast exam 02/26/2021. FINDINGS: Lower chest: Hypoventilatory atelectasis in the right lower lobe. No pulmonary nodule or pleural effusion. Fluid-filled dilated distal esophagus. Normal heart size with coronary artery calcifications. Hepatobiliary: Pneumobilia likely related to intervening ERCP. Common bile duct stent is in place. No intrahepatic biliary ductal dilatation. Decreased gallbladder distension from prior exam. Equivocal gallbladder wall thickening but no pericholecystic fat stranding. Suspected tiny gallstones. Pancreas: Pancreatic head mass is difficult to delineate on the current exam, however appears  decreased in size from prior currently measuring approximately 2.8 x 2.9 cm, series 2, image 40. There is distal pancreatic atrophy and ductal dilatation with pancreatic duct measuring 4 mm. No definite peripancreatic fat stranding or inflammation. Spleen: Normal in size without focal abnormality. Adrenals/Urinary Tract: No adrenal nodule. No hydronephrosis or perinephric edema. Homogeneous renal enhancement with symmetric excretion on delayed phase imaging. Tiny hypodensity in the mid lower right kidney is too small to characterize. Urinary bladder is physiologically distended without wall thickening. Stomach/Bowel: Fluid distends the distal esophagus which is mildly dilated. The stomach is markedly dilated and fluid-filled. There may be mass effect on the gastroduodenal junction, series 2, image 38, from pancreatic mass, difficult to accurately define bowel in this region due to lack of enteric contrast. Duodenum is difficult to define, however there is fluid within the duodenum which is nondistended. Few fluid-filled small bowel loops without abnormal small bowel distension or inflammation. Appendix not visualized, appendectomy per history. Moderate volume of colonic stool. There is no colonic wall thickening or inflammation. Diverticulosis from the splenic flexure distally. No diverticulitis. Vascular/Lymphatic: Moderate aortic and branch atherosclerosis. Portal caval node measuring 1.2 x 2.3 cm, not significantly changed in size allowing for differences in caliper placement. Periportal node has likely increased from prior, 15 mm short axis series 2, image 26. There are multiple additional prominent nodes in the porta hepatis. No portal vein thrombus. The superior mesenteric vein is attenuated. The splenic vein is attenuated. No evidence of thrombus or occlusion. There are some left upper quadrant collaterals. Reproductive: Prostatic calcifications. Other: No ascites.  No free air.  No omental thickening.  Musculoskeletal: Degenerative change in the lumbar spine with primarily facet hypertrophy. No focal bone lesion or acute osseous abnormalities are seen IMPRESSION: 1. Markedly dilated fluid-filled stomach, with fluid distending the distal esophagus. This may be due to mass effect from pancreatic mass, however the soft tissue planes adjacent to the distal stomach are not well-defined on this current exam in the absence enteric contrast. 2. Common bile duct stent in place with pneumobilia. Decreased gallbladder distension from prior exam. Equivocal gallbladder wall thickening but no pericholecystic fat stranding. 3. Known pancreatic head mass is difficult to delineate, however appears decreased in size from prior exam. Periportal adenopathy, with slight increase size of some of the periportal nodes. 4. Colonic diverticulosis without diverticulitis. 5. Attenuated superior mesenteric and splenic veins without thrombus or occlusion. There are left upper quadrant collaterals. Aortic Atherosclerosis (ICD10-I70.0). Electronically Signed   By: MKeith RakeM.D.   On: 05/04/2021 20:17   DG Abd 2 Views  Result Date: 05/12/2021 CLINICAL DATA:  Abdominal pain. EXAM:  ABDOMEN - 2 VIEW COMPARISON:  Abdominal x-ray 05/08/2021. CT abdomen and pelvis 05/04/2021. FINDINGS: The stomach appears markedly dilated with air-fluid level. There are no dilated small bowel loops or colon. Colonic air is seen to the level the rectum. Stent is again noted in the right abdomen. There is a phlebolith in the left hemipelvis. Vascular calcifications are again noted in the left pelvis. There is no free intraperitoneal air under the diaphragm. Branching air in the right upper quadrant corresponds to pneumobilia seen on recent CT. No acute fractures are seen. There are degenerative changes of the spine and hips. IMPRESSION: 1. The stomach is dilated with large air-fluid level. This appears similar to the prior study. 2. Small bowel and colon are  nondilated. Electronically Signed   By: Ronney Asters M.D.   On: 05/12/2021 16:15   DG Abd 2 Views  Result Date: 05/08/2021 CLINICAL DATA:  Abdominal distention. EXAM: ABDOMEN - 2 VIEW COMPARISON:  Radiograph earlier today.  CT 05/04/2021 FINDINGS: No evidence of free intra-abdominal air. Right upper quadrant biliary stent with resultant pneumobilia is unchanged in positioning from prior exam. Soft tissue fullness in the left upper quadrant corresponds to gastric distention on prior CT. There is no small bowel dilatation. Minimal formed stool in the colon. IMPRESSION: 1. Soft tissue fullness in the left upper abdomen corresponds to fluid distended stomach on prior CT. No small bowel dilatation. 2. Right upper quadrant biliary stent with resultant pneumobilia is unchanged in positioning from prior exam. Electronically Signed   By: Keith Rake M.D.   On: 05/08/2021 12:49      Subjective: No concerns overnight. No nausea or vomiting.  Discharge Exam: Vitals:   05/13/21 2013 05/14/21 0406  BP: 120/74 138/79  Pulse: 73 77  Resp: 17 17  Temp: 98.7 F (37.1 C) 99.1 F (37.3 C)  SpO2: 100% 97%   Vitals:   05/13/21 0417 05/13/21 1350 05/13/21 2013 05/14/21 0406  BP: 131/78 123/82 120/74 138/79  Pulse: 73 70 73 77  Resp: '17 16 17 17  '$ Temp: 98.1 F (36.7 C) 98.6 F (37 C) 98.7 F (37.1 C) 99.1 F (37.3 C)  TempSrc:      SpO2: 100% 98% 100% 97%  Weight:      Height:        General exam: Appears calm and comfortable Respiratory system: Clear to auscultation. Respiratory effort normal. Cardiovascular system: S1 & S2 heard, RRR. No murmurs, rubs, gallops or clicks. Gastrointestinal system: Abdomen is nondistended, soft and nontender. Normal bowel sounds heard. Central nervous system: Alert and oriented. No focal neurological deficits. Musculoskeletal: No edema. No calf tenderness Skin: No cyanosis. No rashes Psychiatry: Judgement and insight appear normal. Mood & affect appropriate.      The results of significant diagnostics from this hospitalization (including imaging, microbiology, ancillary and laboratory) are listed below for reference.     Microbiology: Recent Results (from the past 240 hour(s))  Culture, blood (routine x 2)     Status: None   Collection Time: 05/04/21  7:19 PM   Specimen: BLOOD  Result Value Ref Range Status   Specimen Description   Final    BLOOD LEFT ANTECUBITAL Performed at [facility] [address], [place], [place] [address]    Special Requests   Final    BOTTLES DRAWN AEROBIC AND ANAEROBIC Blood Culture results may not be optimal due to an excessive volume of blood received in culture bottles Performed at [facility]  [address], [place], [place] [address]    Culture   Final    NO GROWTH 5 DAYS Performed at [facility], [place] [address] [place], [place] [address]    Report Status 05/10/2021 FINAL  Final  Culture, blood (routine x 2)     Status: None   Collection Time: 05/04/21  7:19 PM   Specimen: BLOOD  Result Value Ref Range Status   Specimen Description   Final    BLOOD BLOOD LEFT HAND Performed at [facility] [address], [place], [place] [address]    Special Requests   Final    BOTTLES DRAWN AEROBIC AND ANAEROBIC Blood Culture adequate volume Performed at [facility] [address] [place], [place] [address]    Culture   Final    NO GROWTH 5 DAYS Performed at [facility], [place] [address], [place], [place] [address]    Report Status 05/10/2021 FINAL  Final  Resp Panel by RT-PCR (Flu A&B, Covid) Nasopharyngeal Swab     Status: None   Collection Time: 05/04/21  9:28 PM   Specimen: Nasopharyngeal Swab; Nasopharyngeal(NP) swabs in vial transport medium  Result Value Ref Range Status   SARS Coronavirus 2 by RT PCR NEGATIVE NEGATIVE Final    Comment: (NOTE) SARS-CoV-2 target nucleic acids are NOT DETECTED.  The  SARS-CoV-2 RNA is generally detectable in upper respiratory specimens during the acute phase of infection. The lowest concentration of SARS-CoV-2 viral copies this assay can detect is 138 copies/mL. A negative result does not preclude SARS-Cov-2 infection and should not be used as the sole basis for treatment or other patient management decisions. A negative result may occur with  improper specimen collection/handling, submission of specimen other than nasopharyngeal swab, presence of viral mutation(s) within the areas targeted by this assay, and inadequate number of viral copies(<138 copies/mL). A negative result must be combined with clinical observations, patient history, and epidemiological information. The expected result is Negative.  Fact Sheet for Patients:  EntrepreneurPulse.com.au  Fact Sheet for Healthcare Providers:  IncredibleEmployment.be  This test is no t yet approved or cleared by the [place] FDA and  has been authorized for detection and/or diagnosis of SARS-CoV-2 by FDA under an Emergency Use Authorization (EUA). This EUA will remain  in effect (meaning this test can be used) for the duration of the COVID-19 declaration under Section 564(b)(1) of the Act, 21 U.S.C.section 360bbb-3(b)(1), unless the authorization is terminated  or revoked sooner.       Influenza A by PCR NEGATIVE NEGATIVE Final   Influenza B by PCR NEGATIVE NEGATIVE Final    Comment: (NOTE) The Xpert Xpress SARS-CoV-2/FLU/RSV plus assay is intended as an aid in the diagnosis of influenza from Nasopharyngeal swab specimens and should not be used as a sole basis for treatment. Nasal washings and aspirates are unacceptable for Xpert Xpress SARS-CoV-2/FLU/RSV testing.  Fact Sheet for Patients: EntrepreneurPulse.com.au  Fact Sheet for Healthcare Providers: IncredibleEmployment.be  This test is not yet approved or  cleared by the [place] FDA and has been authorized for detection and/or diagnosis of SARS-CoV-2 by FDA under an Emergency Use Authorization (EUA). This EUA will remain in effect (meaning this test can be used) for the duration of the COVID-19 declaration under Section 564(b)(1) of the Act, 21 U.S.C. section 360bbb-3(b)(1), unless the authorization is terminated or revoked.  Performed at [facility], [place] [address], [place],  [address]   Resp Panel by RT-PCR (Flu  A&B, Covid) Nasopharyngeal Swab     Status: None   Collection Time: 05/12/21  8:03 PM   Specimen: Nasopharyngeal Swab; Nasopharyngeal(NP) swabs in vial transport medium  Result Value Ref Range Status   SARS Coronavirus 2 by RT PCR NEGATIVE NEGATIVE Final    Comment: (NOTE) SARS-CoV-2 target nucleic acids are NOT DETECTED.  The SARS-CoV-2 RNA is generally detectable in upper respiratory specimens during the acute phase of infection. The lowest concentration of SARS-CoV-2 viral copies this assay can detect is 138 copies/mL. A negative result does not preclude SARS-Cov-2 infection and should not be used as the sole basis for treatment or other patient management decisions. A negative result may occur with  improper specimen collection/handling, submission of specimen other than nasopharyngeal swab, presence of viral mutation(s) within the areas targeted by this assay, and inadequate number of viral copies(<138 copies/mL). A negative result must be combined with clinical observations, patient history, and epidemiological information. The expected result is Negative.  Fact Sheet for Patients:  EntrepreneurPulse.com.au  Fact Sheet for Healthcare Providers:  IncredibleEmployment.be  This test is no t yet approved or cleared by the [place] FDA and  has been authorized for detection and/or diagnosis of SARS-CoV-2 by FDA under an Emergency Use  Authorization (EUA). This EUA will remain  in effect (meaning this test can be used) for the duration of the COVID-19 declaration under Section 564(b)(1) of the Act, 21 U.S.C.section 360bbb-3(b)(1), unless the authorization is terminated  or revoked sooner.       Influenza A by PCR NEGATIVE NEGATIVE Final   Influenza B by PCR NEGATIVE NEGATIVE Final    Comment: (NOTE) The Xpert Xpress SARS-CoV-2/FLU/RSV plus assay is intended as an aid in the diagnosis of influenza from Nasopharyngeal swab specimens and should not be used as a sole basis for treatment. Nasal washings and aspirates are unacceptable for Xpert Xpress SARS-CoV-2/FLU/RSV testing.  Fact Sheet for Patients: EntrepreneurPulse.com.au  Fact Sheet for Healthcare Providers: IncredibleEmployment.be  This test is not yet approved or cleared by the [place] FDA and has been authorized for detection and/or diagnosis of SARS-CoV-2 by FDA under an Emergency Use Authorization (EUA). This EUA will remain in effect (meaning this test can be used) for the duration of the COVID-19 declaration under Section 564(b)(1) of the Act, 21 U.S.C. section 360bbb-3(b)(1), unless the authorization is terminated or revoked.  Performed at [facility], [place] [address] [place], [place] [address]      Labs: BNP (last 3 results) No results for input(s): BNP in the last 8760 hours. Basic Metabolic Panel: Recent Labs  Lab 05/08/21 0517 05/12/21 1557 05/12/21 1900 05/13/21 0311 05/14/21 0317  NA 140 131* 139 136 139  K 3.8 3.8 2.3* 3.7 3.7  CL 107 76* 103 96* 106  CO2 '25 28 24 30 26  '$ GLUCOSE 140* 476* 299* 176* 72  BUN 7 60* 41* 36* 12  CREATININE 0.41* 1.61* 0.76 0.58* 0.35*  CALCIUM 9.0 8.7* 6.2* 8.7* 8.8*  MG  --   --  1.3*  --   --    Liver Function Tests: Recent Labs  Lab 05/12/21 1557 05/13/21 0311  AST 20 13*  ALT 13 10  ALKPHOS 76 71  BILITOT 2.0* 0.7  PROT 7.2  6.3*  ALBUMIN 3.2* 2.7*   Recent Labs  Lab 05/12/21 1557  LIPASE 17   No results for input(s): AMMONIA in the last 168 hours. CBC: Recent Labs  Lab 05/12/21 1558 05/13/21 0311 05/14/21 [id number]  WBC 19.5* 14.8* 11.9*  NEUTROABS 14.4*  --   --   HGB 11.9* 10.5* 10.3*  HCT 34.8* 30.5* 30.3*  MCV 89.0 88.2 88.6  PLT 469* 352 349   Cardiac Enzymes: No results for input(s): CKTOTAL, CKMB, CKMBINDEX, TROPONINI in the last 168 hours. BNP: Invalid input(s): POCBNP CBG: Recent Labs  Lab 05/13/21 2011 05/13/21 2349 05/14/21 0402 05/14/21 0748 05/14/21 1139  GLUCAP 232* 123* 78 125* 130*   D-Dimer No results for input(s): DDIMER in the last 72 hours. Hgb A1c No results for input(s): HGBA1C in the last 72 hours. Lipid Profile No results for input(s): CHOL, HDL, LDLCALC, TRIG, CHOLHDL, LDLDIRECT in the last 72 hours. Thyroid function studies No results for input(s): TSH, T4TOTAL, T3FREE, THYROIDAB in the last 72 hours.  Invalid input(s): FREET3 Anemia work up No results for input(s): VITAMINB12, FOLATE, FERRITIN, TIBC, IRON, RETICCTPCT in the last 72 hours. Urinalysis    Component Value Date/Time   COLORURINE YELLOW 05/12/2021 1807   APPEARANCEUR CLEAR 05/12/2021 1807   LABSPEC 1.020 05/12/2021 1807   PHURINE 6.0 05/12/2021 1807   GLUCOSEU >=500 (A) 05/12/2021 1807   HGBUR NEGATIVE 05/12/2021 1807   BILIRUBINUR SMALL (A) 05/12/2021 1807   BILIRUBINUR neg 08/03/2017 0959   KETONESUR 80 (A) 05/12/2021 1807   PROTEINUR NEGATIVE 05/12/2021 1807   UROBILINOGEN 0.2 08/03/2017 0959   UROBILINOGEN 0.2 09/28/2013 1520   NITRITE NEGATIVE 05/12/2021 1807   LEUKOCYTESUR NEGATIVE 05/12/2021 1807   Sepsis Labs Invalid input(s): PROCALCITONIN,  WBC,  LACTICIDVEN Microbiology Recent Results (from the past 240 hour(s))  Culture, blood (routine x 2)     Status: None   Collection Time: 05/04/21  7:19 PM   Specimen: BLOOD  Result Value Ref Range Status   Specimen Description    Final    BLOOD LEFT ANTECUBITAL Performed at [facility], [place] [address], [place], [place] [address]    Special Requests   Final    BOTTLES DRAWN AEROBIC AND ANAEROBIC Blood Culture results may not be optimal due to an excessive volume of blood received in culture bottles Performed at [facility] [address], [place], [place] [address]    Culture   Final    NO GROWTH 5 DAYS Performed at [facility], [place] [address], [place], [place] [address]    Report Status 05/10/2021 FINAL  Final  Culture, blood (routine x 2)     Status: None   Collection Time: 05/04/21  7:19 PM   Specimen: BLOOD  Result Value Ref Range Status   Specimen Description   Final    BLOOD BLOOD LEFT HAND Performed at [facility] [address] [place], [place] [address]    Special Requests   Final    BOTTLES DRAWN AEROBIC AND ANAEROBIC Blood Culture adequate volume Performed at [facility] [address], [place], [place] [address]    Culture   Final    NO GROWTH 5 DAYS Performed at [facility], [place] [address], [place], [place] [address]    Report Status 05/10/2021 FINAL  Final  Resp Panel by RT-PCR (Flu A&B, Covid) Nasopharyngeal Swab     Status: None   Collection Time: 05/04/21  9:28 PM   Specimen: Nasopharyngeal Swab; Nasopharyngeal(NP) swabs in vial transport medium  Result Value Ref Range Status   SARS Coronavirus 2 by RT PCR NEGATIVE NEGATIVE Final    Comment: (NOTE) SARS-CoV-2 target nucleic acids are NOT DETECTED.  The SARS-CoV-2 RNA is generally detectable in upper respiratory specimens during the acute phase of infection. The lowest concentration of SARS-CoV-2 viral copies this assay can detect is 138 copies/mL. A negative result does not preclude SARS-Cov-2 infection and should not be used as the sole basis for treatment or other patient management decisions. A negative result may occur with   improper specimen collection/handling, submission of specimen other than nasopharyngeal swab, presence of viral mutation(s) within the areas targeted by this assay, and inadequate number of viral copies(<138 copies/mL). A negative result must be combined with clinical observations, patient history, and epidemiological information. The expected result is Negative.  Fact Sheet for Patients:  EntrepreneurPulse.com.au  Fact Sheet for Healthcare Providers:  IncredibleEmployment.be  This test is no t yet approved or cleared by the [place] FDA and  has been authorized for detection and/or diagnosis of SARS-CoV-2 by FDA under an Emergency Use Authorization (EUA). This EUA will remain  in effect (meaning this test can be used) for the duration of the COVID-19 declaration under Section 564(b)(1) of the Act, 21 U.S.C.section 360bbb-3(b)(1), unless the authorization is terminated  or revoked sooner.       Influenza A by PCR NEGATIVE NEGATIVE Final   Influenza B by PCR NEGATIVE NEGATIVE Final    Comment: (NOTE) The Xpert Xpress SARS-CoV-2/FLU/RSV plus assay is intended as an aid in the diagnosis of influenza from Nasopharyngeal swab specimens and should not be used as a sole basis for treatment. Nasal washings and aspirates are unacceptable for Xpert Xpress SARS-CoV-2/FLU/RSV testing.  Fact Sheet for Patients: EntrepreneurPulse.com.au  Fact Sheet for Healthcare Providers: IncredibleEmployment.be  This test is not yet approved or cleared by the [place] FDA and has been authorized for detection and/or diagnosis of SARS-CoV-2 by FDA under an Emergency Use Authorization (EUA). This EUA will remain in effect (meaning this test can be used) for the duration of the COVID-19 declaration under Section 564(b)(1) of the Act, 21 U.S.C. section 360bbb-3(b)(1), unless the authorization is terminated  or revoked.  Performed at [facility], [place] [address], [place], [place] [address]   Resp Panel by RT-PCR (Flu A&B, Covid) Nasopharyngeal Swab     Status: None   Collection Time: 05/12/21  8:03 PM   Specimen: Nasopharyngeal Swab; Nasopharyngeal(NP) swabs in vial transport medium  Result Value Ref Range Status   SARS Coronavirus 2 by RT PCR NEGATIVE NEGATIVE Final    Comment: (NOTE) SARS-CoV-2 target nucleic acids are NOT DETECTED.  The SARS-CoV-2 RNA is generally detectable in upper respiratory specimens during the acute phase of infection. The lowest concentration of SARS-CoV-2 viral copies this assay can detect is 138 copies/mL. A negative result does not preclude SARS-Cov-2 infection and should not be used as the sole basis for treatment or other patient management decisions. A negative result may occur with  improper specimen collection/handling, submission of specimen other than nasopharyngeal swab, presence of viral mutation(s) within the areas targeted by this assay, and inadequate number of viral copies(<138 copies/mL). A negative result must be combined with clinical observations, patient history, and epidemiological information. The expected result is Negative.  Fact Sheet for Patients:  EntrepreneurPulse.com.au  Fact Sheet for Healthcare Providers:  IncredibleEmployment.be  This test is no t yet approved or cleared by the [place] FDA and  has been authorized for detection and/or diagnosis of SARS-CoV-2 by FDA under an Emergency Use Authorization (EUA). This EUA will remain  in effect (meaning this test can be used) for the duration  of the COVID-19 declaration under Section 564(b)(1) of the Act, 21 U.S.C.section 360bbb-3(b)(1), unless the authorization is terminated  or revoked sooner.       Influenza A by PCR NEGATIVE NEGATIVE Final   Influenza B by PCR NEGATIVE NEGATIVE Final    Comment: (NOTE) The  Xpert Xpress SARS-CoV-2/FLU/RSV plus assay is intended as an aid in the diagnosis of influenza from Nasopharyngeal swab specimens and should not be used as a sole basis for treatment. Nasal washings and aspirates are unacceptable for Xpert Xpress SARS-CoV-2/FLU/RSV testing.  Fact Sheet for Patients: EntrepreneurPulse.com.au  Fact Sheet for Healthcare Providers: IncredibleEmployment.be  This test is not yet approved or cleared by the [place] FDA and has been authorized for detection and/or diagnosis of SARS-CoV-2 by FDA under an Emergency Use Authorization (EUA). This EUA will remain in effect (meaning this test can be used) for the duration of the COVID-19 declaration under Section 564(b)(1) of the Act, 21 U.S.C. section 360bbb-3(b)(1), unless the authorization is terminated or revoked.  Performed at [facility], [place] [address], [place], [place] [address]      Time coordinating discharge: 35 minutes  SIGNED:   Cordelia Poche, MD [facility] 05/14/2021, 12:09 PM

## 2021-05-14 NOTE — Plan of Care (Signed)
Pt ready to DC home with home health care

## 2021-05-15 ENCOUNTER — Encounter: Payer: Self-pay | Admitting: Gastroenterology

## 2021-05-15 ENCOUNTER — Encounter: Payer: Self-pay | Admitting: Hematology and Oncology

## 2021-05-15 ENCOUNTER — Ambulatory Visit (INDEPENDENT_AMBULATORY_CARE_PROVIDER_SITE_OTHER): Payer: Medicaid Other | Admitting: Gastroenterology

## 2021-05-15 ENCOUNTER — Telehealth: Payer: Self-pay | Admitting: *Deleted

## 2021-05-15 ENCOUNTER — Telehealth: Payer: Self-pay

## 2021-05-15 ENCOUNTER — Encounter: Payer: Self-pay | Admitting: General Practice

## 2021-05-15 VITALS — BP 118/68 | HR 104 | Ht 69.0 in | Wt 147.0 lb

## 2021-05-15 DIAGNOSIS — C259 Malignant neoplasm of pancreas, unspecified: Secondary | ICD-10-CM

## 2021-05-15 DIAGNOSIS — K30 Functional dyspepsia: Secondary | ICD-10-CM

## 2021-05-15 DIAGNOSIS — R112 Nausea with vomiting, unspecified: Secondary | ICD-10-CM | POA: Diagnosis not present

## 2021-05-15 NOTE — Telephone Encounter (Signed)
Transition Care Management Unsuccessful Follow-up Telephone Call  Date of discharge and from where:  05/14/2021, [facility]   Attempts:  1st Attempt  Reason for unsuccessful TCM follow-up call:  Left voice message on # [phone].

## 2021-05-15 NOTE — Progress Notes (Signed)
[place] CSW Progress Notes  Called patient, he is working with [employer] (physical sciences) Aid for his Social Security disability application. He has no kitchen or running water at his current apartment.  Gets Physicist, medical.  Also gets food from [employer].  Is limited by size of his fridge and freezer so cannot get a lot of food at any one time.  He does not have cooking facilities inside, but makes do with a crockpot and an outside grill.  He is using [employer] for appointments, also reports his mother drives him to appointments/shopping/getting medications.  Discussed the options for assisted living that are now available to him as he has recently gotten Medicaid.  He would be eligible for assisted living placement - he does not want to pursue that at this time.  He is fairly mobile, reports that his house is small so he can get around easily.   He gets his mail at [employer] - he is able to check his mail once/week.  He is aware that he will be getting mail from [employer] that will need to be responded to in a timely manner.  He initiated the request for help from Legal Aid with Medicaid - Legal Aid helped him with his appeal hearing.  He states that he has completed paperwork to apply for disability with both DSS and via Legal Aid.  He is aware that he needs to check on the status of this application as he has not received any information from [employer].  He will try to find out his status and update CSW as needed.    He is agreeable to a referral to [employer] [facility] [employer] [facility] for in home delivery of food.  CSW will submit that referral tomorrow after meeting w him during his oncology appointment.  Edwyna Shell, LCSW Clinical Social Worker Phone:  [phone]

## 2021-05-15 NOTE — Progress Notes (Signed)
HPI :  60 year old male here for follow-up visit.  Recall that he has a history of Barrett's esophagus, cirrhosis due to hepatitis C and history of alcohol use.  Hep C has been eradicated.  Unfortunately he presented in late June with jaundice and was found to have a new pancreatic head mass causing CBD obstruction.  He underwent ERCP with Dr. 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  He has been followed by Dr. Lorenso Courier of oncology and has been treated with FOLFIRINOX regimen.  The patient hopes he may be a surgical candidate if he can get through chemotherapy.  He was evaluated by surgery previously who determined that he was borderline resectable.  Unfortunately his course has been complicated by 2 hospitalizations over the past 2 weeks for abdominal distention and nausea vomiting.  He had a CT scan on September 4 showing a markedly dilated fluid-filled stomach.  He had an NG placed to remove the fluid and underwent an EGD which showed some residual food in the stomach but the pyloric channel was patent.  It was thought that he has gastroparesis/functional disorder related to his underlying diabetes in the setting of new malignancy and chemotherapy.  He was given some antiemetics and discharged home on September 8.  He unfortunately presented to the hospital on September 12 with recurrent symptoms and dehydration with hyponatremia and AKI.  He was discharged yesterday after IV fluid hydration and conservative measures.  He states he has been doing okay since his discharge yesterday.  He is able to keep liquids down but does not have much appetite, does have early satiety.  He was previously on narcotics for pain and has stopped that since his first admission.  He has been taking Reglan 5 mg 4 times daily as outpatient.  He had Reglan given to him while inpatient through the IV.  He states 30 minutes prior to his visit he did try to eat some food and  had bacon cheeseburger.  He states he feels very full after eating.  He has not been vomiting at all.  He is urinating normally.  He is accompanied by his mother today.  He is currently taking Creon for his pancreatic insufficiency, Reglan 3-4 times daily 5 mg, Remeron nightly, omeprazole 40 mg daily, Zofran and Compazine as needed.  He has lost a significant amount of weight through his recent course, down to 146 pounds.  Previously was 175 pounds few months ago.  He is currently about to start his third cycle of chemotherapy with Dr. Lorenso Courier who he sees tomorrow.  His last A1c was 9.6.  He states his glucose levels are better controlled than previous.     CT scan of the abdomen and pelvis with contrast here showed the following:   IMPRESSION: 1. Markedly dilated fluid-filled stomach, with fluid distending the distal esophagus. This may be due to mass effect from pancreatic mass, however the soft tissue planes adjacent to the distal stomach are not well-defined on this current exam in the absence enteric contrast. 2. Common bile duct stent in place with pneumobilia. Decreased gallbladder distension from prior exam. Equivocal gallbladder wall thickening but no pericholecystic fat stranding. 3. Known pancreatic head mass is difficult to delineate, however appears decreased in size from prior exam. Periportal adenopathy, with slight increase size of some of the periportal nodes. 4. Colonic diverticulosis without diverticulitis. 5. Attenuated superior mesenteric and splenic veins without thrombus or occlusion. There are left  upper quadrant collaterals.   Aortic Atherosclerosis (ICD10-I70.0).     ERCP  03/27/2021: - Distal CBD stricture s/p insertion of covered metal biliary stent.    S/P EGD 05/06/2021: LA Grade C (one or more mucosal breaks continuous between tops of 2 or more mucosal folds, less than 75% circumference) esophagitis with no bleeding was found 34 to 38 cm from the  incisors. Findings: A large amount of food (residue) was found in the gastric fundus and in the gastric body. Removal of food was accomplished. Pyloric channel appeared normal with no obvious gastric outlet obstructions. Moderately congested mucosa without active bleeding and with no stigmata of bleeding was found in the first portion of the duodenum and in the second portion of the duodenum. No fixed stenosis or stricture. No duodenal ulcer   Past Medical History:  Diagnosis Date   Barrett's esophagus dx 2016   Bronchitis    Chronic hepatitis C without hepatic coma ([place]) 10/31/2014   Depression    Diabetes ([place])    ED (erectile dysfunction)    GERD (gastroesophageal reflux disease)    Hepatitis C    Hiatal hernia 10/2014   3cm   Neuropathy    S/P transmetatarsal amputation of foot, left ([place]) 11/28/2020     Past Surgical History:  Procedure Laterality Date   AMPUTATION Left 11/15/2020   Procedure: LEFT TRANSMETATARSALS AMPUTATION;  Surgeon: Newt Minion, MD;  Location: [facility];  Service: Orthopedics;  Laterality: Left;   APPENDECTOMY     BILIARY STENT PLACEMENT N/A 02/27/2021   Procedure: BILIARY STENT PLACEMENT;  Surgeon: Milus Banister, MD;  Location: [facility];  Service: Endoscopy;  Laterality: N/A;   BILIARY STENT PLACEMENT N/A 03/27/2021   Procedure: BILIARY STENT PLACEMENT;  Surgeon: Jackquline Denmark, MD;  Location: [facility];  Service: Endoscopy;  Laterality: N/A;   BIOPSY  03/27/2021   Procedure: BIOPSY;  Surgeon: Jackquline Denmark, MD;  Location: [facility];  Service: Endoscopy;;   COLONOSCOPY N/A 02/16/2014   Procedure: COLONOSCOPY;  Surgeon: Gatha Mayer, MD;  Location: [facility];  Service: Endoscopy;  Laterality: N/A;   COLONOSCOPY     ENDOSCOPIC RETROGRADE CHOLANGIOPANCREATOGRAPHY (ERCP) WITH PROPOFOL N/A 02/27/2021   Procedure: ENDOSCOPIC RETROGRADE CHOLANGIOPANCREATOGRAPHY (ERCP) WITH PROPOFOL;  Surgeon: Milus Banister, MD;  [facility];   Service: Endoscopy;  Laterality: N/A;   ERCP N/A 03/27/2021   Procedure: ENDOSCOPIC RETROGRADE CHOLANGIOPANCREATOGRAPHY (ERCP);  Surgeon: Jackquline Denmark, MD;  Location: [facility];  Service: Endoscopy;  Laterality: N/A;   ESOPHAGOGASTRODUODENOSCOPY (EGD) WITH PROPOFOL N/A 05/06/2021   Procedure: ESOPHAGOGASTRODUODENOSCOPY (EGD) WITH PROPOFOL;  Surgeon: Mauri Pole, MD;  [facility];  Service: Endoscopy;  Laterality: N/A;   EUS N/A 02/27/2021   Procedure: UPPER ENDOSCOPIC ULTRASOUND (EUS) RADIAL;  Surgeon: Milus Banister, MD;  Location: [facility];  Service: Endoscopy;  Laterality: N/A;   FINE NEEDLE ASPIRATION N/A 02/27/2021   Procedure: FINE NEEDLE ASPIRATION (FNA) LINEAR;  Surgeon: Milus Banister, MD;  [facility];  Service: Endoscopy;  Laterality: N/A;   IR IMAGING GUIDED PORT INSERTION  03/21/2021   SPHINCTEROTOMY  02/27/2021   Procedure: SPHINCTEROTOMY;  Surgeon: Milus Banister, MD;  [facility];  Service: Endoscopy;;   STENT REMOVAL  03/27/2021   Procedure: STENT REMOVAL;  Surgeon: Jackquline Denmark, MD;  [facility];  Service: Endoscopy;;   TONSILLECTOMY     Family History  Problem Relation Age of Onset   Breast cancer Mother    Stroke Father  Alcohol abuse Father    Heart disease Maternal Grandfather    Pancreatic cancer Paternal Grandmother    Diabetes Paternal Grandfather    Colon cancer Neg Hx    Stomach cancer Neg Hx    Rectal cancer Neg Hx    Esophageal cancer Neg Hx    Social History   Tobacco Use   Smoking status: Every Day    Packs/day: 0.50    Years: 35.00    Pack years: 17.50    Types: Cigarettes   Smokeless tobacco: Never  Vaping Use   Vaping Use: Never used  Substance Use Topics   Alcohol use: Not Currently    Comment: previous   Drug use: No   Current Outpatient Medications  Medication Sig Dispense Refill   albuterol (VENTOLIN HFA) 108 (90 Base) MCG/ACT inhaler Inhale 2 puffs into the lungs every 6  (six) hours as needed for wheezing or shortness of breath. 18 g 0   atorvastatin (LIPITOR) 40 MG tablet Take 40 mg by mouth daily.     diphenoxylate-atropine (LOMOTIL) 2.5-0.025 MG tablet Take 1 tablet by mouth 4 (four) times daily as needed for diarrhea or loose stools. (Patient not taking: Reported on 05/15/2021) 60 tablet 1   DULoxetine (CYMBALTA) 60 MG capsule Take 1 capsule (60 mg total) by mouth daily. 90 capsule 0   EPINEPHrine 0.3 mg/0.3 mL IJ SOAJ injection Inject 0.3 mg into the muscle once as needed for anaphylaxis. (Patient not taking: Reported on [id number])     folic acid (FOLVITE) 1 MG tablet Take 1 tablet (1 mg total) by mouth in the morning.     glucose blood (TRUE METRIX BLOOD GLUCOSE TEST) test strip Use as instructed. Check blood glucose level by fingerstick 3-4 times per day.  E11.65 200 each 12   insulin glargine (LANTUS SOLOSTAR) 100 UNIT/ML Solostar Pen Inject 10 Units into the skin 2 (two) times daily. 15 mL 0   insulin lispro (HUMALOG) 100 UNIT/ML KwikPen Inject 0-9 Units into the skin 3 (three) times daily. Sliding scale CBG 70 - 120: 0 units CBG 121 - 150: 1 unit,  CBG 151 - 200: 2 units,  CBG 201 - 250: 3 units,  CBG 251 - 300: 5 units,  CBG 301 - 350: 7 units,  CBG 351 - 400: 9 units   CBG > 400: 9 units and notify your MD (Patient not taking: No sig reported) 15 mL 11   Insulin Pen Needle (TECHLITE PEN NEEDLES) 32G X 4 MM MISC USE AS INSTRUCTED INTO THE SKIN TWICE DAILY 100 each 11   Insulin Pen Needle (TRUEPLUS PEN NEEDLES) 32G X 4 MM MISC Use as instructed. Inject into the skin twice daily 200 each 6   lidocaine-prilocaine (EMLA) cream Apply 1 application topically as directed as needed. (Patient taking differently: Apply 1 application topically once as needed (prior to port access).) 30 g 0   lipase/protease/amylase (CREON) 36000 UNITS CPEP capsule Take 2 capsules (72,000 Units total) by mouth 3 (three) times daily with meals. May also take 1 capsule (36,000 Units total)  as needed (with snacks). NEEDS PASS. 210 capsule 3   losartan (COZAAR) 50 MG tablet Take 1 tablet (50 mg total) by mouth daily.     magic mouthwash w/lidocaine SOLN Take 5 mLs by mouth 4 (four) times daily as needed. (Patient taking differently: Take 5 mLs by mouth 4 (four) times daily as needed for mouth pain.) 400 mL 0   metoCLOPramide (REGLAN) 5 MG tablet Take 1  tablet (5 mg total) by mouth 4 (four) times daily -  before meals and at bedtime. 90 tablet 1   mirtazapine (REMERON) 30 MG tablet Take 1 tablet (30 mg total) by mouth at bedtime. 90 tablet 1   Multiple Vitamins-Minerals (THEREMS-M) TABS Take 1 tablet by mouth in the morning.     omeprazole (PRILOSEC) 40 MG capsule Take 1 capsule (40 mg total) by mouth in the morning.     ondansetron (ZOFRAN) 8 MG tablet Take 1 tablet (8 mg total) by mouth 2 (two) times daily as needed. Start on day 3 after chemotherapy. (Patient taking differently: Take 8 mg by mouth 2 (two) times daily as needed for nausea or vomiting. Start on day 3 after chemotherapy.) 30 tablet 1   polyethylene glycol (MIRALAX / GLYCOLAX) 17 g packet Take 17 g by mouth at bedtime. (Patient not taking: No sig reported) 14 each 0   prochlorperazine (COMPAZINE) 10 MG tablet Take 1 tablet (10 mg total) by mouth every 6 (six) hours as needed (Nausea or vomiting). 30 tablet 1   TRUEplus Lancets 28G MISC Use as instructed. Check blood glucose level by fingerstick 3-4 times per day.   E11.65 200 each 3   No current facility-administered medications for this visit.   Allergies  Allergen Reactions   Bee Venom Anaphylaxis   Lactose Intolerance (Gi) Other (See Comments)    GI upset     Review of Systems: All systems reviewed and negative except where noted in HPI.   Lab Results  Component Value Date   WBC 11.9 (H) 05/14/2021   HGB 10.3 (L) 05/14/2021   HCT 30.3 (L) 05/14/2021   MCV 88.6 05/14/2021   PLT 349 05/14/2021    Lab Results  Component Value Date   CREATININE 0.35 (L)  05/14/2021   BUN 12 05/14/2021   NA 139 05/14/2021   K 3.7 05/14/2021   CL 106 05/14/2021   CO2 26 05/14/2021    Lab Results  Component Value Date   ALT 10 05/13/2021   AST 13 (L) 05/13/2021   ALKPHOS 71 05/13/2021   BILITOT 0.7 05/13/2021     Physical Exam: BP 118/68 (BP Location: Left Arm, Patient Position: Sitting, Cuff Size: Normal)   Pulse (!) 104   Ht '5\' 9"'$  (1.753 m) Comment: height measured without shoes  Wt 147 lb (66.7 kg)   BMI 21.71 kg/m  Constitutional: Pleasant,well-developed, male in no acute distress. Abdominal: Soft, mildly distended, nontender.  Extremities: no edema Neurological: Alert and oriented to person place and time. Skin: Skin is warm and dry. No rashes noted. Psychiatric: Normal mood and affect. Behavior is normal.   ASSESSMENT AND PLAN: 60 year old male here for reassessment of following:  Suspected gastroparesis / delayed gastric emptying Nausea vomiting Pancreatic adenocarcinoma  Difficult situation.  He has at baseline poorly controlled diabetes and at risk for gastroparesis, now with malignancy/chemotherapy he appears to have worsening of gastric motility.  He had an EGD which showed no outlet obstruction, pylorus was patent.  He is on antiemetics, Reglan, PPI.  He did better with hydration in the hospital and discharged tolerating p.o.  Of note his QTc is at the upper limit of normal in the setting of when he was receiving IV Reglan.  He is currently on a lower dose of 5 mg 3-4 times daily.  We discussed the situation.  He needs to keep a close eye on glucose levels to make sure good control.  We discussed options to manage  this.  Normally I would consider increasing dose of his Reglan or adding erythromycin to help with motility however currently limited by his QTc on last EKG.  He will asked Dr. Lorenso Courier about this tomorrow as we do not have capability to do an EKG in our office, this needs to be monitored while on Reglan.  The mainstay of  therapy at this point time is likely dietary for now.  He needs to stay on a liquid diet for now, can use protein shakes and high caloric liquid meal supplementation.  I think he will do better on that.  He just ate a bacon cheeseburger prior to coming in the office, this will take a long time for him to clear and probably make him feel worse, and given his recent multiple admissions I really think he needs to stay on liquid diet for now until he is really feeling better.  Hopefully we can keep him out of the hospital and he can continue his course of chemotherapy with oncology.  He agreed with the plan.  Continue antiemetics as needed and PPI in the interim.  Plan: - liquid diet - use protein shakes / high caloric meal supplements as outlined - should have follow up EKG to monitor OTc. Limited in Reglan dosing and adding erythromycin if this is prolonged - continue PPI - continue antiemetics - he will see nutritionist through oncology clinic - tight glucose control if possible  He will call me with any worsening symptoms. Hopefully liquid diet will be much better tolerated for the time being. Can slowly advance over weeks if he does okay.   Jolly Mango, MD [facility]

## 2021-05-15 NOTE — Telephone Encounter (Signed)
Transition Care Management Follow-up Telephone Call  Noted in chart, pt has already had transition of care call today.  Jacqlyn Larsen [facility], BSN RN Case Manager [phone]

## 2021-05-15 NOTE — Patient Instructions (Addendum)
If you are age 59 or younger, your body mass index should be between 19-25. Your Body mass index is 21.71 kg/m. If this is out of the aformentioned range listed, please consider follow up with your Primary Care Provider.  _________________________________________________________  The [facility] providers would like to encourage you to use [facility] to communicate with providers for non-urgent requests or questions.  Due to long hold times on the telephone, sending your provider a message by [facility] may be a faster and more efficient way to get a response.  Please allow 48 business hours for a response.  Please remember that this is for non-urgent requests.   Please continue liquid diet as instructed at office visit today.  Thank you for entrusting me with your care and choosing [facility].  Dr Havery Moros

## 2021-05-16 ENCOUNTER — Other Ambulatory Visit: Payer: Self-pay

## 2021-05-16 ENCOUNTER — Telehealth: Payer: Self-pay

## 2021-05-16 ENCOUNTER — Encounter: Payer: Self-pay | Admitting: Hematology and Oncology

## 2021-05-16 ENCOUNTER — Inpatient Hospital Stay: Payer: Medicaid Other | Admitting: General Practice

## 2021-05-16 ENCOUNTER — Inpatient Hospital Stay (HOSPITAL_BASED_OUTPATIENT_CLINIC_OR_DEPARTMENT_OTHER): Payer: Medicaid Other | Admitting: Hematology and Oncology

## 2021-05-16 ENCOUNTER — Encounter: Payer: Self-pay | Admitting: Nutrition

## 2021-05-16 ENCOUNTER — Inpatient Hospital Stay: Payer: Medicaid Other | Attending: Hematology and Oncology

## 2021-05-16 VITALS — BP 147/78 | HR 84 | Temp 97.6°F | Resp 17 | Ht 69.0 in | Wt 147.9 lb

## 2021-05-16 DIAGNOSIS — Z5111 Encounter for antineoplastic chemotherapy: Secondary | ICD-10-CM | POA: Diagnosis not present

## 2021-05-16 DIAGNOSIS — Z95828 Presence of other vascular implants and grafts: Secondary | ICD-10-CM

## 2021-05-16 DIAGNOSIS — Z79899 Other long term (current) drug therapy: Secondary | ICD-10-CM | POA: Insufficient documentation

## 2021-05-16 DIAGNOSIS — E119 Type 2 diabetes mellitus without complications: Secondary | ICD-10-CM | POA: Insufficient documentation

## 2021-05-16 DIAGNOSIS — C250 Malignant neoplasm of head of pancreas: Secondary | ICD-10-CM

## 2021-05-16 DIAGNOSIS — Z794 Long term (current) use of insulin: Secondary | ICD-10-CM | POA: Insufficient documentation

## 2021-05-16 DIAGNOSIS — F17210 Nicotine dependence, cigarettes, uncomplicated: Secondary | ICD-10-CM | POA: Diagnosis not present

## 2021-05-16 LAB — CBC WITH DIFFERENTIAL (CANCER CENTER ONLY)
Abs Immature Granulocytes: 0.07 10*3/uL (ref 0.00–0.07)
Basophils Absolute: 0.1 10*3/uL (ref 0.0–0.1)
Basophils Relative: 0 %
Eosinophils Absolute: 0.2 10*3/uL (ref 0.0–0.5)
Eosinophils Relative: 2 %
HCT: 32.4 % — ABNORMAL LOW (ref 39.0–52.0)
Hemoglobin: 11 g/dL — ABNORMAL LOW (ref 13.0–17.0)
Immature Granulocytes: 1 %
Lymphocytes Relative: 22 %
Lymphs Abs: 2.5 10*3/uL (ref 0.7–4.0)
MCH: 29.6 pg (ref 26.0–34.0)
MCHC: 34 g/dL (ref 30.0–36.0)
MCV: 87.3 fL (ref 80.0–100.0)
Monocytes Absolute: 1.6 10*3/uL — ABNORMAL HIGH (ref 0.1–1.0)
Monocytes Relative: 14 %
Neutro Abs: 7 10*3/uL (ref 1.7–7.7)
Neutrophils Relative %: 61 %
Platelet Count: 356 10*3/uL (ref 150–400)
RBC: 3.71 MIL/uL — ABNORMAL LOW (ref 4.22–5.81)
RDW: 13.6 % (ref 11.5–15.5)
WBC Count: 11.3 10*3/uL — ABNORMAL HIGH (ref 4.0–10.5)
nRBC: 0 % (ref 0.0–0.2)

## 2021-05-16 LAB — CMP (CANCER CENTER ONLY)
ALT: 12 U/L (ref 0–44)
AST: 20 U/L (ref 15–41)
Albumin: 2.9 g/dL — ABNORMAL LOW (ref 3.5–5.0)
Alkaline Phosphatase: 100 U/L (ref 38–126)
Anion gap: 11 (ref 5–15)
BUN: 12 mg/dL (ref 6–20)
CO2: 24 mmol/L (ref 22–32)
Calcium: 9 mg/dL (ref 8.9–10.3)
Chloride: 97 mmol/L — ABNORMAL LOW (ref 98–111)
Creatinine: 0.64 mg/dL (ref 0.61–1.24)
GFR, Estimated: >60 mL/min (ref >60)
Glucose, Bld: 310 mg/dL — ABNORMAL HIGH (ref 70–99)
Potassium: 4 mmol/L (ref 3.5–5.1)
Sodium: 132 mmol/L — ABNORMAL LOW (ref 135–145)
Total Bilirubin: 0.7 mg/dL (ref 0.3–1.2)
Total Protein: 7.2 g/dL (ref 6.5–8.1)

## 2021-05-16 MED ORDER — SODIUM CHLORIDE 0.9% FLUSH
10.0000 mL | Freq: Once | INTRAVENOUS | Status: AC
  Administered 2021-05-16: 10 mL

## 2021-05-16 MED ORDER — HEPARIN SOD (PORK) LOCK FLUSH 100 UNIT/ML IV SOLN
500.0000 [IU] | Freq: Once | INTRAVENOUS | Status: AC
  Administered 2021-05-16: 500 [IU]

## 2021-05-16 NOTE — Progress Notes (Signed)
[place] [phone]   [phone]  PROGRESS NOTE  Patient Care Team: [name] Pounds, NP as PCP - General (Nurse Practitioner) [name] [name], [name] Raspberry, MD as Consulting Physician (Gastroenterology) [name] Slick, MD as Consulting Physician (Oncology) [name] Bake, RN as Registered Nurse  Hematological/Oncological History # Adenocarcinoma of the Pancreas, Stage IIB 02/26/2021: patient seen by GI for jaundice and pancreatic mass causing CBD obstruction. CT abdomen showed pancreatic head mass (4.4 x 3.7 cm) and enlarged portocaval lymph nodes. 02/27/2021: ERCP with placement of a nonmetallic biliary stent. FNA of pancreatic head showed malignant cells consistent with pancreatic adenocarcinoma. 03/05/2021: CT Chest showed no evidence of metastatic disease in the chest. 03/12/2021: establish care with [name] [name] 04/10/2021: Cycle 1 Day 1 of FOLFIRINOX 04/24/2021: Cycle 2 Day 1 of FOLFIRINOX  Interval History:  [name] [name] 60 y.o. male with medical history significant for borderline resectable pancreatic cancer presents for a follow up visit. The patient's last visit was on 04/24/2021. In the interim since the last visit he was admitted to the hospital with failure to thrive.  He presents today as a hospital follow-up.  On exam today [name] [name] is accompanied by his mom. He reports he feels much better after his discharge from the hospital.  He is tolerating liquid diet p.o. and gastroparesis diet.  Etiology of his gastroparesis is thought to be either opioid induced or from his high blood sugars.  He notes that his pain is currently well controlled to a 10 in severity.  He did have some nausea and headache this a.m. otherwise has been well.  His bowels are moving and he reports he had a soft stool earlier this morning.  He otherwise denies any fevers, chills, sweats, shortness of breath, chest pain.  Full 10 point ROS is listed below.   Overall he has had  marked clinical improvement after his discharge from the hospital.  He is tolerating his liquid diet and reports that he is willing and able to proceed with treatment.  MEDICAL HISTORY:  Past Medical History:  Diagnosis Date   Barrett's esophagus dx 2016   Bronchitis    Chronic hepatitis C without hepatic coma ([place]) 10/31/2014   Depression    Diabetes ([place])    ED (erectile dysfunction)    GERD (gastroesophageal reflux disease)    Hepatitis C    Hiatal hernia 10/2014   3cm   Neuropathy    S/P transmetatarsal amputation of foot, left ([place]) 11/28/2020    SURGICAL HISTORY: Past Surgical History:  Procedure Laterality Date   AMPUTATION Left 11/15/2020   Procedure: LEFT TRANSMETATARSALS AMPUTATION;  Surgeon: [name] Minion, MD;  Location: [facility];  Service: Orthopedics;  Laterality: Left;   APPENDECTOMY     BILIARY STENT PLACEMENT N/A 02/27/2021   Procedure: BILIARY STENT PLACEMENT;  Surgeon: [name] Banister, MD;  Location: [facility];  Service: Endoscopy;  Laterality: N/A;   BILIARY STENT PLACEMENT N/A 03/27/2021   Procedure: BILIARY STENT PLACEMENT;  Surgeon: [name] Denmark, MD;  [facility];  Service: Endoscopy;  Laterality: N/A;   BIOPSY  03/27/2021   Procedure: BIOPSY;  Surgeon: [name] Denmark, MD;  [facility];  Service: Endoscopy;;   COLONOSCOPY N/A 02/16/2014   Procedure: COLONOSCOPY;  Surgeon: [name] Mayer, MD;  [facility];  Service: Endoscopy;  Laterality: N/A;   COLONOSCOPY     ENDOSCOPIC RETROGRADE CHOLANGIOPANCREATOGRAPHY (ERCP) WITH PROPOFOL N/A 02/27/2021   Procedure: ENDOSCOPIC RETROGRADE CHOLANGIOPANCREATOGRAPHY (ERCP) WITH PROPOFOL;  Surgeon: [name] Banister, MD;  Location: [facility];  Service: Endoscopy;  Laterality: N/A;   ERCP N/A 03/27/2021   Procedure: ENDOSCOPIC RETROGRADE CHOLANGIOPANCREATOGRAPHY (ERCP);  Surgeon: [name] Denmark, MD;  Location: [facility];  Service: Endoscopy;  Laterality: N/A;   ESOPHAGOGASTRODUODENOSCOPY (EGD)  WITH PROPOFOL N/A 05/06/2021   Procedure: ESOPHAGOGASTRODUODENOSCOPY (EGD) WITH PROPOFOL;  Surgeon: [name] Pole, MD;  [facility];  Service: Endoscopy;  Laterality: N/A;   EUS N/A 02/27/2021   Procedure: UPPER ENDOSCOPIC ULTRASOUND (EUS) RADIAL;  Surgeon: [name] Banister, MD;  [facility];  Service: Endoscopy;  Laterality: N/A;   FINE NEEDLE ASPIRATION N/A 02/27/2021   Procedure: FINE NEEDLE ASPIRATION (FNA) LINEAR;  Surgeon: [name] Banister, MD;  [facility];  Service: Endoscopy;  Laterality: N/A;   IR IMAGING GUIDED PORT INSERTION  03/21/2021   SPHINCTEROTOMY  02/27/2021   Procedure: SPHINCTEROTOMY;  Surgeon: [name] Banister, MD;  [facility];  Service: Endoscopy;;   STENT REMOVAL  03/27/2021   Procedure: Lavell Islam REMOVAL;  Surgeon: [name] Denmark, MD;  [facility];  Service: Endoscopy;;   TONSILLECTOMY      SOCIAL HISTORY: Social History   Socioeconomic History   Marital status: Single    Spouse name: Not on file   Number of children: Not on file   Years of education: Not on file   Highest education level: Not on file  Occupational History   Not on file  Tobacco Use   Smoking status: Every Day    Packs/day: 0.50    Years: 35.00    Pack years: 17.50    Types: Cigarettes   Smokeless tobacco: Never  Vaping Use   Vaping Use: Never used  Substance and Sexual Activity   Alcohol use: Not Currently    Comment: previous   Drug use: No   Sexual activity: Not on file  Other Topics Concern   Not on file  Social History Narrative   Not on file   Social Determinants of Health   Financial Resource Strain: High Risk   Difficulty of Paying Living Expenses: Very hard  Food Insecurity: Food Insecurity Present   Worried About Charity fundraiser in the Last Year: Sometimes true   Arboriculturist in the Last Year: Sometimes true  Transportation Needs: Public librarian (Medical): Yes   Lack of  Transportation (Non-Medical): Yes  Physical Activity: Not on file  Stress: Not on file  Social Connections: Not on file  Intimate Partner Violence: Not on file    FAMILY HISTORY: Family History  Problem Relation Age of Onset   Breast cancer Mother    Stroke Father    Alcohol abuse Father    Heart disease Maternal Grandfather    Pancreatic cancer Paternal Grandmother    Diabetes Paternal Grandfather    Colon cancer Neg Hx    Stomach cancer Neg Hx    Rectal cancer Neg Hx    Esophageal cancer Neg Hx     ALLERGIES:  is allergic to bee venom and lactose intolerance (gi).  MEDICATIONS:  Current Outpatient Medications  Medication Sig Dispense Refill   albuterol (VENTOLIN HFA) 108 (90 Base) MCG/ACT inhaler Inhale 2 puffs into the lungs every 6 (six) hours as needed for wheezing or shortness of breath. 18 g 0   atorvastatin (LIPITOR) 40 MG tablet Take 40 mg by mouth daily.     diphenoxylate-atropine (LOMOTIL) 2.5-0.025 MG tablet Take 1 tablet by mouth 4 (  four) times daily as needed for diarrhea or loose stools. (Patient not taking: Reported on 05/15/2021) 60 tablet 1   DULoxetine (CYMBALTA) 60 MG capsule Take 1 capsule (60 mg total) by mouth daily. 90 capsule 0   EPINEPHrine 0.3 mg/0.3 mL IJ SOAJ injection Inject 0.3 mg into the muscle once as needed for anaphylaxis. (Patient not taking: Reported on [id number])     folic acid (FOLVITE) 1 MG tablet Take 1 tablet (1 mg total) by mouth in the morning.     glucose blood (TRUE METRIX BLOOD GLUCOSE TEST) test strip Use as instructed. Check blood glucose level by fingerstick 3-4 times per day.  E11.65 200 each 12   insulin glargine (LANTUS SOLOSTAR) 100 UNIT/ML Solostar Pen Inject 10 Units into the skin 2 (two) times daily. 15 mL 0   insulin lispro (HUMALOG) 100 UNIT/ML KwikPen Inject 0-9 Units into the skin 3 (three) times daily. Sliding scale CBG 70 - 120: 0 units CBG 121 - 150: 1 unit,  CBG 151 - 200: 2 units,  CBG 201 - 250: 3 units,  CBG 251 -  300: 5 units,  CBG 301 - 350: 7 units,  CBG 351 - 400: 9 units   CBG > 400: 9 units and notify your MD (Patient not taking: No sig reported) 15 mL 11   Insulin Pen Needle (TECHLITE PEN NEEDLES) 32G X 4 MM MISC USE AS INSTRUCTED INTO THE SKIN TWICE DAILY 100 each 11   Insulin Pen Needle (TRUEPLUS PEN NEEDLES) 32G X 4 MM MISC Use as instructed. Inject into the skin twice daily 200 each 6   lidocaine-prilocaine (EMLA) cream Apply 1 application topically as directed as needed. (Patient taking differently: Apply 1 application topically once as needed (prior to port access).) 30 g 0   lipase/protease/amylase (CREON) 36000 UNITS CPEP capsule Take 2 capsules (72,000 Units total) by mouth 3 (three) times daily with meals. May also take 1 capsule (36,000 Units total) as needed (with snacks). NEEDS PASS. 210 capsule 3   losartan (COZAAR) 50 MG tablet Take 1 tablet (50 mg total) by mouth daily.     magic mouthwash w/lidocaine SOLN Take 5 mLs by mouth 4 (four) times daily as needed. (Patient taking differently: Take 5 mLs by mouth 4 (four) times daily as needed for mouth pain.) 400 mL 0   metoCLOPramide (REGLAN) 5 MG tablet Take 1 tablet (5 mg total) by mouth 4 (four) times daily -  before meals and at bedtime. 90 tablet 1   mirtazapine (REMERON) 30 MG tablet Take 1 tablet (30 mg total) by mouth at bedtime. 90 tablet 1   Multiple Vitamins-Minerals (THEREMS-M) TABS Take 1 tablet by mouth in the morning.     omeprazole (PRILOSEC) 40 MG capsule Take 1 capsule (40 mg total) by mouth in the morning.     ondansetron (ZOFRAN) 8 MG tablet Take 1 tablet (8 mg total) by mouth 2 (two) times daily as needed. Start on day 3 after chemotherapy. (Patient taking differently: Take 8 mg by mouth 2 (two) times daily as needed for nausea or vomiting. Start on day 3 after chemotherapy.) 30 tablet 1   polyethylene glycol (MIRALAX / GLYCOLAX) 17 g packet Take 17 g by mouth at bedtime. (Patient not taking: No sig reported) 14 each 0    prochlorperazine (COMPAZINE) 10 MG tablet Take 1 tablet (10 mg total) by mouth every 6 (six) hours as needed (Nausea or vomiting). 30 tablet 1   TRUEplus Lancets 28G MISC Use  as instructed. Check blood glucose level by fingerstick 3-4 times per day.   E11.65 200 each 3   No current facility-administered medications for this visit.    REVIEW OF SYSTEMS:   Constitutional: ( - ) fevers, ( - )  chills , ( - ) night sweats Eyes: ( - ) blurriness of vision, ( - ) double vision, ( - ) watery eyes Ears, nose, mouth, throat, and face: ( - ) mucositis, ( - ) sore throat Respiratory: ( - ) cough, ( - ) dyspnea, ( - ) wheezes Cardiovascular: ( - ) palpitation, ( - ) chest discomfort, ( - ) lower extremity swelling Gastrointestinal:  ( - ) nausea, ( - ) heartburn, ( - ) change in bowel habits Skin: ( - ) abnormal skin rashes Lymphatics: ( - ) new lymphadenopathy, ( - ) easy bruising Neurological: ( - ) numbness, ( - ) tingling, ( - ) new weaknesses Behavioral/Psych: ( - ) mood change, ( - ) new changes  All other systems were reviewed with the patient and are negative.  PHYSICAL EXAMINATION: ECOG PERFORMANCE STATUS: 1 - Symptomatic but completely ambulatory  Vitals:   05/16/21 1131  BP: (!) 147/78  Pulse: 84  Resp: 17  Temp: 97.6 F (36.4 C)  SpO2: 100%    Filed Weights   05/16/21 1131  Weight: 147 lb 14.4 oz (67.1 kg)    GENERAL: Well-appearing middle-age Caucasian male, alert, no distress and comfortable SKIN: skin color, texture, turgor are normal, no rashes or significant lesions.  EYES: conjunctiva are pink and non-injected, sclera clear LUNGS: clear to auscultation and percussion with normal breathing effort HEART: regular rate & rhythm and no murmurs and no lower extremity edema PSYCH: alert & oriented x 3, fluent speech NEURO: no focal motor/sensory deficits  LABORATORY DATA:  I have reviewed the data as listed CBC Latest Ref Rng & Units 05/16/2021 05/14/2021 05/13/2021  WBC  4.0 - 10.5 K/uL 11.3(H) 11.9(H) 14.8(H)  Hemoglobin 13.0 - 17.0 g/dL 11.0(L) 10.3(L) 10.5(L)  Hematocrit 39.0 - 52.0 % 32.4(L) 30.3(L) 30.5(L)  Platelets 150 - 400 K/uL 356 349 352    CMP Latest Ref Rng & Units 05/16/2021 05/14/2021 05/13/2021  Glucose 70 - 99 mg/dL 310(H) 72 176(H)  BUN 6 - 20 mg/dL 12 12 36(H)  Creatinine 0.61 - 1.24 mg/dL 0.64 0.35(L) 0.58(L)  Sodium 135 - 145 mmol/L 132(L) 139 136  Potassium 3.5 - 5.1 mmol/L 4.0 3.7 3.7  Chloride 98 - 111 mmol/L 97(L) 106 96(L)  CO2 22 - 32 mmol/L '24 26 30  '$ Calcium 8.9 - 10.3 mg/dL 9.0 8.8(L) 8.7(L)  Total Protein 6.5 - 8.1 g/dL 7.2 - 6.3(L)  Total Bilirubin 0.3 - 1.2 mg/dL 0.7 - 0.7  Alkaline Phos 38 - 126 U/L 100 - 71  AST 15 - 41 U/L 20 - 13(L)  ALT 0 - 44 U/L 12 - 10    RADIOGRAPHIC STUDIES: I have personally reviewed the radiological images as listed and agreed with the findings in the report: Borderline resectable pancreatic mass with no evidence of metastatic disease. DG Abd 1 View  Result Date: 05/07/2021 CLINICAL DATA:  Left lower quadrant abdominal pain EXAM: ABDOMEN - 1 VIEW COMPARISON:  CT abdomen/pelvis 03/28/2021 FINDINGS: There is a nonobstructive bowel gas pattern. There is no gross organomegaly or abnormal soft tissue calcification. A biliary stent is noted. There is associated pneumobilia projecting over the liver. There is no acute osseous abnormality. IMPRESSION: No acute abnormality identified. Electronically Signed  By: Valetta Mole M.D.   On: 05/07/2021 14:38   CT Abdomen Pelvis W Contrast  Result Date: 05/04/2021 CLINICAL DATA:  Diverticulitis suspected abdominal pain Patient reports left lower quadrant pain. Fever. Nausea and vomiting. History of pancreatic cancer, active chemotherapy. EXAM: CT ABDOMEN AND PELVIS WITH CONTRAST TECHNIQUE: Multidetector CT imaging of the abdomen and pelvis was performed using the standard protocol following bolus administration of intravenous contrast. CONTRAST:  64m OMNIPAQUE  IOHEXOL 350 MG/ML SOLN COMPARISON:  Most recent CT noncontrast exam 02/26/2021. FINDINGS: Lower chest: Hypoventilatory atelectasis in the right lower lobe. No pulmonary nodule or pleural effusion. Fluid-filled dilated distal esophagus. Normal heart size with coronary artery calcifications. Hepatobiliary: Pneumobilia likely related to intervening ERCP. Common bile duct stent is in place. No intrahepatic biliary ductal dilatation. Decreased gallbladder distension from prior exam. Equivocal gallbladder wall thickening but no pericholecystic fat stranding. Suspected tiny gallstones. Pancreas: Pancreatic head mass is difficult to delineate on the current exam, however appears decreased in size from prior currently measuring approximately 2.8 x 2.9 cm, series 2, image 40. There is distal pancreatic atrophy and ductal dilatation with pancreatic duct measuring 4 mm. No definite peripancreatic fat stranding or inflammation. Spleen: Normal in size without focal abnormality. Adrenals/Urinary Tract: No adrenal nodule. No hydronephrosis or perinephric edema. Homogeneous renal enhancement with symmetric excretion on delayed phase imaging. Tiny hypodensity in the mid lower right kidney is too small to characterize. Urinary bladder is physiologically distended without wall thickening. Stomach/Bowel: Fluid distends the distal esophagus which is mildly dilated. The stomach is markedly dilated and fluid-filled. There may be mass effect on the gastroduodenal junction, series 2, image 38, from pancreatic mass, difficult to accurately define bowel in this region due to lack of enteric contrast. Duodenum is difficult to define, however there is fluid within the duodenum which is nondistended. Few fluid-filled small bowel loops without abnormal small bowel distension or inflammation. Appendix not visualized, appendectomy per history. Moderate volume of colonic stool. There is no colonic wall thickening or inflammation. Diverticulosis from  the splenic flexure distally. No diverticulitis. Vascular/Lymphatic: Moderate aortic and branch atherosclerosis. Portal caval node measuring 1.2 x 2.3 cm, not significantly changed in size allowing for differences in caliper placement. Periportal node has likely increased from prior, 15 mm short axis series 2, image 26. There are multiple additional prominent nodes in the porta hepatis. No portal vein thrombus. The superior mesenteric vein is attenuated. The splenic vein is attenuated. No evidence of thrombus or occlusion. There are some left upper quadrant collaterals. Reproductive: Prostatic calcifications. Other: No ascites.  No free air.  No omental thickening. Musculoskeletal: Degenerative change in the lumbar spine with primarily facet hypertrophy. No focal bone lesion or acute osseous abnormalities are seen IMPRESSION: 1. Markedly dilated fluid-filled stomach, with fluid distending the distal esophagus. This may be due to mass effect from pancreatic mass, however the soft tissue planes adjacent to the distal stomach are not well-defined on this current exam in the absence enteric contrast. 2. Common bile duct stent in place with pneumobilia. Decreased gallbladder distension from prior exam. Equivocal gallbladder wall thickening but no pericholecystic fat stranding. 3. Known pancreatic head mass is difficult to delineate, however appears decreased in size from prior exam. Periportal adenopathy, with slight increase size of some of the periportal nodes. 4. Colonic diverticulosis without diverticulitis. 5. Attenuated superior mesenteric and splenic veins without thrombus or occlusion. There are left upper quadrant collaterals. Aortic Atherosclerosis (ICD10-I70.0). Electronically Signed   By: MAurther LoftD.  On: 05/04/2021 20:17   DG Abd 2 Views  Result Date: 05/12/2021 CLINICAL DATA:  Abdominal pain. EXAM: ABDOMEN - 2 VIEW COMPARISON:  Abdominal x-ray 05/08/2021. CT abdomen and pelvis 05/04/2021.  FINDINGS: The stomach appears markedly dilated with air-fluid level. There are no dilated small bowel loops or colon. Colonic air is seen to the level the rectum. Stent is again noted in the right abdomen. There is a phlebolith in the left hemipelvis. Vascular calcifications are again noted in the left pelvis. There is no free intraperitoneal air under the diaphragm. Branching air in the right upper quadrant corresponds to pneumobilia seen on recent CT. No acute fractures are seen. There are degenerative changes of the spine and hips. IMPRESSION: 1. The stomach is dilated with large air-fluid level. This appears similar to the prior study. 2. Small bowel and colon are nondilated. Electronically Signed   By: Ronney Asters M.D.   On: 05/12/2021 16:15   DG Abd 2 Views  Result Date: 05/08/2021 CLINICAL DATA:  Abdominal distention. EXAM: ABDOMEN - 2 VIEW COMPARISON:  Radiograph earlier today.  CT 05/04/2021 FINDINGS: No evidence of free intra-abdominal air. Right upper quadrant biliary stent with resultant pneumobilia is unchanged in positioning from prior exam. Soft tissue fullness in the left upper quadrant corresponds to gastric distention on prior CT. There is no small bowel dilatation. Minimal formed stool in the colon. IMPRESSION: 1. Soft tissue fullness in the left upper abdomen corresponds to fluid distended stomach on prior CT. No small bowel dilatation. 2. Right upper quadrant biliary stent with resultant pneumobilia is unchanged in positioning from prior exam. Electronically Signed   By: Keith Rake M.D.   On: 05/08/2021 12:49    ASSESSMENT & PLAN [name] [name] 60 y.o. male with medical history significant for borderline resectable pancreatic cancer presents for a follow up visit.   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  The patient does still have an elevation  in bilirubin which likely represents a decline from the time of the latest stent placement.  He was evaluated by surgery who determined this is a borderline resectable tumor.  We will plan to start the patient on neoadjuvant chemotherapy.  The 2 options would be Gem/Abraxane or FOLFIRINOX.  Given his excellent functional status I do believe he would be a good candidate for FOLFIRINOX.   # Adenocarcinoma of the Pancreas, Stage IIB -- At this time disease appears borderline resectable.  We will proceed with neoadjuvant chemotherapy.  At this time would prefer a regimen of FOLFIRINOX given his good baseline health. -- case reviewed by surgery, who agrees his disease is borderline resectable.  -- baseline elevations in both CA 19-9 and CEA. Continue to monitor during treatment.  --Staging scans complete.  No need for further imaging at this time. Will plan for repeat imaging in approximately 8 cycles to reassess --Reassuringly during his admission for intractable nausea and vomiting a CT scan of the abdomen showed marked response in the tumor to the chemotherapy treatment. -- Cycle 1 Day 1 of FOLFIRINOX started 04/10/2021 Plan:  -- RTC in 1 week for Cycle 3 of treatment   # Pain Control -- Patient currently taking ibuprofen for his abdominal pain --continue oxycodone 5 to 10 mg every 6 hours as needed   #Supportive Care -- chemotherapy education complete -- port placed -- zofran '8mg'$  q8H PRN and compazine '10mg'$   PO q6H for nausea --trial of Creon called into pharmacy to help with loose stools. -- EMLA cream for port  No orders of the defined types were placed in this encounter.  All questions were answered. The patient knows to call the clinic with any problems, questions or concerns.  A total of more than 30 minutes were spent on this encounter with face-to-face time and non-face-to-face time, including preparing to see the patient, ordering tests and/or medications, counseling the patient and  coordination of care as outlined above.   Ledell Peoples, MD Department of Hematology/Oncology [place] [facility] Phone: [phone] Pager: [phone] Email: [email or web address]  05/16/2021 4:31 PM

## 2021-05-16 NOTE — Telephone Encounter (Signed)
Transition Care Management Unsuccessful Follow-up Telephone Call   Date of discharge and from where:  05/14/2021, [facility]    Attempts:  2nd Attempt   Reason for unsuccessful TCM follow-up call:  Left voice message on # [phone].

## 2021-05-16 NOTE — Progress Notes (Addendum)
[place] CSW Progress Notes  Met w patient in exam room, he is accompanied by his mother who is a main support for him.  They are going to investigate alternative housing options for him, provided him with list of elderly/disabled housing options in [place] for their review.  Cautioned that most have waiting lists.  Provided him second disbursement from [employer] for food/gas needs.  Provided him w bag of food from [employer].  Encouraged him to follow up w [employer] on his application for Social Security disability - he will update CSW when he is able to get a status update.  He needs to communicate with Social Security Administration and Legal Aid directly due to their requirements.  Referred to [employer] [facility] for possible help w delivery of food as he has limited transportation.  He will require diabetic friendly food that align with his need for soft foods, provider will be advised of these requirements and assist if possible.   Edwyna Shell, LCSW Clinical Social Worker Phone:  [phone]

## 2021-05-16 NOTE — Progress Notes (Signed)
Was notified by provider that patient needed full liquid diet with oral nutrition supplements. Brief nutrition follow-up completed with patient and his mother.  Weight continues to decline and was documented as 147.9 pounds.  Noted medications include Reglan 4 times daily, Creon, Zofran, Compazine.  Noted labs: Sodium 132, glucose 310, albumin 2.9. Patient advised to stay on a full liquid diet with oral nutrition supplements.  Educated patient and mother on allowed foods on a full liquid diet.  Provided multiple samples of Glucerna 1.5 and recommended patient drink 3 bottles daily.  Patient verbalized understanding.  Also encouraged patient to check blood sugars as MD has advised take medications per MD.  Follow-up has been scheduled on Thursday, September 22 during infusion.

## 2021-05-17 NOTE — ED Provider Notes (Signed)
[facility] [employer] Provider Note   CSN: [id number] Arrival date & time: 05/12/21  1444     History Chief Complaint  Patient presents with   Fall   abdominal swelling   Emesis   Fatigue    HASSELL BERNAL is a 60 y.o. male.  Patient with history of pancreatic cancer, presents to ER chief complaint of continued vomiting abdominal bloating.  Recently admitted for similar symptoms and discharged home.  Patient states after he went home he had continued vomiting every time he eats something he would throw up again.  Currently denies any abdominal pain but states that the pain returns soon as he eats something.  Denies fevers or cough denies diarrhea.      Past Medical History:  Diagnosis Date   Barrett's esophagus dx 2016   Bronchitis    Chronic hepatitis C without hepatic coma ([place]) 10/31/2014   Depression    Diabetes ([place])    ED (erectile dysfunction)    GERD (gastroesophageal reflux disease)    Hepatitis C    Hiatal hernia 10/2014   3cm   Neuropathy    S/P transmetatarsal amputation of foot, left ([place]) 11/28/2020    Patient Active Problem List   Diagnosis Date Noted   AKI (acute kidney injury) ([place]) 05/12/2021   Abdominal distension    Gastroparesis    Gastric outlet obstruction 05/05/2021   Abdominal pain 05/05/2021   Dehydration 05/05/2021   Nausea & vomiting 05/04/2021   Port-A-Cath in place 04/24/2021   Malnutrition of moderate degree 03/27/2021   Hyperglycemia due to diabetes mellitus ([place]) 03/26/2021   Pancreatic cancer ([place]) 03/12/2021   Malignant neoplasm of head of pancreas ([place]) 03/12/2021   Jaundice 02/26/2021   Loss of weight 02/26/2021   S/P transmetatarsal amputation of foot, left ([place]) 11/28/2020   Hypertension associated with type 2 diabetes mellitus ([place]) 11/28/2020   Type 2 diabetes mellitus with diabetic neuropathy, unspecified ([place]) 11/28/2020   Hyperlipidemia associated with type 2 diabetes mellitus ([place]) 11/28/2020   GERD  without esophagitis 11/28/2020   Major depression, recurrent, chronic ([place]) 11/28/2020   Aortic atherosclerosis ([place]) 11/28/2020   Diabetic neuropathy ([place]) 11/28/2020   Osteomyelitis ([place]) 11/14/2020   Sepsis ([place]) 10/28/2020   Community acquired pneumonia of right lower lobe of lung    Elevated LFTs    Hypophosphatemia    Acute metabolic encephalopathy    Acute maxillary sinusitis    Depression    Physical deconditioning    Pneumonia 05/14/2020   DTs (delirium tremens) ([place]) 05/13/2020   Hyponatremia 05/13/2020   Hypokalemia 05/13/2020   SIRS (systemic inflammatory response syndrome) ([place]) 05/13/2020   Sepsis with acute hypoxic respiratory failure without septic shock (HCC)    Alcohol abuse with alcohol-induced mood disorder ([place]) 12/17/2017   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   Essential hypertension 04/02/2017   Hypogonadism male 08/13/2016   ETOH abuse 06/22/2016   Low serum testosterone level 01/08/2016   Type 2 diabetes mellitus with other specified complication ([place]) [id number]   Erectile dysfunction 09/12/2015   Hepatic cirrhosis ([place]) 01/10/2015   Chronic hepatitis C without hepatic coma ([place]) 10/31/2014   Tobacco use disorder 08/03/2013   Diabetes type 2, controlled ([place]) 04/27/2013    Past Surgical History:  Procedure Laterality Date   AMPUTATION Left 11/15/2020   Procedure: LEFT TRANSMETATARSALS AMPUTATION;  Surgeon: Newt Minion, MD;  Location: [facility];  Service: Orthopedics;  Laterality: Left;   APPENDECTOMY  BILIARY STENT PLACEMENT N/A 02/27/2021   Procedure: BILIARY STENT PLACEMENT;  Surgeon: Milus Banister, MD;  [facility];  Service: Endoscopy;  Laterality: N/A;   BILIARY STENT PLACEMENT N/A 03/27/2021   Procedure: BILIARY STENT PLACEMENT;  Surgeon: Jackquline Denmark, MD;  [facility];  Service: Endoscopy;  Laterality: N/A;   BIOPSY  03/27/2021   Procedure: BIOPSY;   Surgeon: Jackquline Denmark, MD;  [facility];  Service: Endoscopy;;   COLONOSCOPY N/A 02/16/2014   Procedure: COLONOSCOPY;  Surgeon: Gatha Mayer, MD;  [facility];  Service: Endoscopy;  Laterality: N/A;   COLONOSCOPY     ENDOSCOPIC RETROGRADE CHOLANGIOPANCREATOGRAPHY (ERCP) WITH PROPOFOL N/A 02/27/2021   Procedure: ENDOSCOPIC RETROGRADE CHOLANGIOPANCREATOGRAPHY (ERCP) WITH PROPOFOL;  Surgeon: Milus Banister, MD;  [facility];  Service: Endoscopy;  Laterality: N/A;   ERCP N/A 03/27/2021   Procedure: ENDOSCOPIC RETROGRADE CHOLANGIOPANCREATOGRAPHY (ERCP);  Surgeon: Jackquline Denmark, MD;  [facility];  Service: Endoscopy;  Laterality: N/A;   ESOPHAGOGASTRODUODENOSCOPY (EGD) WITH PROPOFOL N/A 05/06/2021   Procedure: ESOPHAGOGASTRODUODENOSCOPY (EGD) WITH PROPOFOL;  Surgeon: Mauri Pole, MD;  [facility];  Service: Endoscopy;  Laterality: N/A;   EUS N/A 02/27/2021   Procedure: UPPER ENDOSCOPIC ULTRASOUND (EUS) RADIAL;  Surgeon: Milus Banister, MD;  [facility];  Service: Endoscopy;  Laterality: N/A;   FINE NEEDLE ASPIRATION N/A 02/27/2021   Procedure: FINE NEEDLE ASPIRATION (FNA) LINEAR;  Surgeon: Milus Banister, MD;  [facility];  Service: Endoscopy;  Laterality: N/A;   IR IMAGING GUIDED PORT INSERTION  03/21/2021   SPHINCTEROTOMY  02/27/2021   Procedure: SPHINCTEROTOMY;  Surgeon: Milus Banister, MD;  [facility];  Service: Endoscopy;;   STENT REMOVAL  03/27/2021   Procedure: STENT REMOVAL;  Surgeon: Jackquline Denmark, MD;  [facility];  Service: Endoscopy;;   TONSILLECTOMY         Family History  Problem Relation Age of Onset   Breast cancer Mother    Stroke Father    Alcohol abuse Father    Heart disease Maternal Grandfather    Pancreatic cancer Paternal Grandmother    Diabetes Paternal Grandfather    Colon cancer Neg Hx    Stomach cancer Neg Hx    Rectal cancer Neg Hx    Esophageal cancer  Neg Hx     Social History   Tobacco Use   Smoking status: Every Day    Packs/day: 0.50    Years: 35.00    Pack years: 17.50    Types: Cigarettes   Smokeless tobacco: Never  Vaping Use   Vaping Use: Never used  Substance Use Topics   Alcohol use: Not Currently    Comment: previous   Drug use: No    Home Medications Prior to Admission medications   Medication Sig Start Date End Date Taking? Authorizing Provider  albuterol (VENTOLIN HFA) 108 (90 Base) MCG/ACT inhaler Inhale 2 puffs into the lungs every 6 (six) hours as needed for wheezing or shortness of breath. 12/05/20  Yes Gerlene Fee, NP  atorvastatin (LIPITOR) 40 MG tablet Take 40 mg by mouth daily.   Yes [provider]  diphenoxylate-atropine (LOMOTIL) 2.5-0.025 MG tablet Take 1 tablet by mouth 4 (four) times daily as needed for diarrhea or loose stools. Patient not taking: Reported on 05/15/2021 04/07/21  Yes [name], NP  DULoxetine (CYMBALTA) 60 MG capsule Take 1 capsule (60 mg total) by mouth daily. 04/04/21 07/03/21 Yes Charlott Rakes, MD  EPINEPHrine 0.3  mg/0.3 mL IJ SOAJ injection Inject 0.3 mg into the muscle once as needed for anaphylaxis. Patient not taking: Reported on 05/15/2021 06/11/20  Yes [provider]  folic acid (FOLVITE) 1 MG tablet Take 1 tablet (1 mg total) by mouth in the morning. 03/28/21  Yes Rai, Ripudeep K, MD  lidocaine-prilocaine (EMLA) cream Apply 1 application topically as directed as needed. Patient taking differently: Apply 1 application topically once as needed (prior to port access). 03/12/21  Yes Orson Slick, MD  lipase/protease/amylase (CREON) 36000 UNITS CPEP capsule Take 2 capsules (72,000 Units total) by mouth 3 (three) times daily with meals. May also take 1 capsule (36,000 Units total) as needed (with snacks). NEEDS PASS. 04/07/21  Yes [name], NP  losartan (COZAAR) 50 MG tablet Take 1 tablet (50 mg total) by mouth daily. 03/28/21 03/28/22 Yes Rai,  Ripudeep K, MD  magic mouthwash w/lidocaine SOLN Take 5 mLs by mouth 4 (four) times daily as needed. Patient taking differently: Take 5 mLs by mouth 4 (four) times daily as needed for mouth pain. 04/16/21  Yes Dede Query T, PA-C  metoCLOPramide (REGLAN) 5 MG tablet Take 1 tablet (5 mg total) by mouth 4 (four) times daily -  before meals and at bedtime. 05/08/21 06/22/21 Yes Hosie Poisson, MD  mirtazapine (REMERON) 30 MG tablet Take 1 tablet (30 mg total) by mouth at bedtime. 01/07/21 06/06/21 Yes [name], NP  Multiple Vitamins-Minerals (THEREMS-M) TABS Take 1 tablet by mouth in the morning.   Yes [provider]  omeprazole (PRILOSEC) 40 MG capsule Take 1 capsule (40 mg total) by mouth in the morning. 03/28/21 03/28/22 Yes Rai, Ripudeep K, MD  ondansetron (ZOFRAN) 8 MG tablet Take 1 tablet (8 mg total) by mouth 2 (two) times daily as needed. Start on day 3 after chemotherapy. Patient taking differently: Take 8 mg by mouth 2 (two) times daily as needed for nausea or vomiting. Start on day 3 after chemotherapy. 04/10/21  Yes Orson Slick, MD  prochlorperazine (COMPAZINE) 10 MG tablet Take 1 tablet (10 mg total) by mouth every 6 (six) hours as needed (Nausea or vomiting). 04/10/21  Yes Ledell Peoples IV, MD  glucose blood (TRUE METRIX BLOOD GLUCOSE TEST) test strip Use as instructed. Check blood glucose level by fingerstick 3-4 times per day.  E11.65 03/04/21   [name], NP  insulin glargine (LANTUS SOLOSTAR) 100 UNIT/ML Solostar Pen Inject 10 Units into the skin 2 (two) times daily. 05/14/21   Mariel Aloe, MD  insulin lispro (HUMALOG) 100 UNIT/ML KwikPen Inject 0-9 Units into the skin 3 (three) times daily. Sliding scale CBG 70 - 120: 0 units CBG 121 - 150: 1 unit,  CBG 151 - 200: 2 units,  CBG 201 - 250: 3 units,  CBG 251 - 300: 5 units,  CBG 301 - 350: 7 units,  CBG 351 - 400: 9 units   CBG > 400: 9 units and notify your MD Patient not taking: No sig reported 03/28/21   Rai,  Ripudeep K, MD  Insulin Pen Needle (TECHLITE PEN NEEDLES) 32G X 4 MM MISC USE AS INSTRUCTED INTO THE SKIN TWICE DAILY 03/04/21   [name], NP  Insulin Pen Needle (TRUEPLUS PEN NEEDLES) 32G X 4 MM MISC Use as instructed. Inject into the skin twice daily 03/04/21   [name], NP  polyethylene glycol (MIRALAX / GLYCOLAX) 17 g packet Take 17 g by mouth at bedtime. Patient not  taking: No sig reported 05/08/21   Hosie Poisson, MD  TRUEplus Lancets 28G MISC Use as instructed. Check blood glucose level by fingerstick 3-4 times per day.   E11.65 03/04/21   [name], NP    Allergies    Bee venom and Lactose intolerance (gi)  Review of Systems   Review of Systems  Constitutional:  Negative for fever.  HENT:  Negative for ear pain and sore throat.   Eyes:  Negative for pain.  Respiratory:  Negative for cough.   Cardiovascular:  Negative for chest pain.  Gastrointestinal:  Positive for abdominal pain.  Genitourinary:  Negative for flank pain.  Musculoskeletal:  Negative for back pain.  Skin:  Negative for color change and rash.  Neurological:  Negative for syncope.  All other systems reviewed and are negative.  Physical Exam Updated Vital Signs BP 138/79 (BP Location: Left Arm)   Pulse 77   Temp 99.1 F (37.3 C)   Resp 17   Ht '5\' 11"'$  (1.803 m)   Wt 65.8 kg   SpO2 97%   BMI 20.22 kg/m   Physical Exam Constitutional:      Appearance: He is well-developed.  HENT:     Head: Normocephalic.     Nose: Nose normal.  Eyes:     Extraocular Movements: Extraocular movements intact.  Cardiovascular:     Rate and Rhythm: Normal rate.  Pulmonary:     Effort: Pulmonary effort is normal.  Abdominal:     Tenderness: There is no abdominal tenderness. There is no guarding or rebound.  Skin:    Coloration: Skin is not jaundiced.  Neurological:     Mental Status: He is alert. Mental status is at baseline.    ED Results / Procedures / Treatments   Labs (all labs ordered are  listed, but only abnormal results are displayed) Labs Reviewed  COMPREHENSIVE METABOLIC PANEL - Abnormal; Notable for the following components:      Result Value   Sodium 131 (*)    Chloride 76 (*)    Glucose, Bld 476 (*)    BUN 60 (*)    Creatinine, Ser 1.61 (*)    Calcium 8.7 (*)    Albumin 3.2 (*)    Total Bilirubin 2.0 (*)    GFR, Estimated 49 (*)    Anion gap >20 (*)    All other components within normal limits  URINALYSIS, ROUTINE W REFLEX MICROSCOPIC - Abnormal; Notable for the following components:   Glucose, UA >=500 (*)    Bilirubin Urine SMALL (*)    Ketones, ur 80 (*)    All other components within normal limits  CBC WITH DIFFERENTIAL/PLATELET - Abnormal; Notable for the following components:   WBC 19.5 (*)    RBC 3.91 (*)    Hemoglobin 11.9 (*)    HCT 34.8 (*)    Platelets 469 (*)    Neutro Abs 14.4 (*)    Monocytes Absolute 2.7 (*)    Abs Immature Granulocytes 0.12 (*)    All other components within normal limits  BLOOD GAS, ARTERIAL - Abnormal; Notable for the following components:   pH, Arterial 7.465 (*)    pO2, Arterial 66.4 (*)    Bicarbonate 30.5 (*)    Acid-Base Excess 6.4 (*)    All other components within normal limits  BETA-HYDROXYBUTYRIC ACID - Abnormal; Notable for the following components:   Beta-Hydroxybutyric Acid 7.86 (*)    All other components within normal limits  BASIC METABOLIC PANEL -  Abnormal; Notable for the following components:   Potassium 2.3 (*)    Glucose, Bld 299 (*)    BUN 41 (*)    Calcium 6.2 (*)    All other components within normal limits  MAGNESIUM - Abnormal; Notable for the following components:   Magnesium 1.3 (*)    All other components within normal limits  COMPREHENSIVE METABOLIC PANEL - Abnormal; Notable for the following components:   Chloride 96 (*)    Glucose, Bld 176 (*)    BUN 36 (*)    Creatinine, Ser 0.58 (*)    Calcium 8.7 (*)    Total Protein 6.3 (*)    Albumin 2.7 (*)    AST 13 (*)    All other  components within normal limits  CBC - Abnormal; Notable for the following components:   WBC 14.8 (*)    RBC 3.46 (*)    Hemoglobin 10.5 (*)    HCT 30.5 (*)    All other components within normal limits  GLUCOSE, CAPILLARY - Abnormal; Notable for the following components:   Glucose-Capillary 210 (*)    All other components within normal limits  GLUCOSE, CAPILLARY - Abnormal; Notable for the following components:   Glucose-Capillary 179 (*)    All other components within normal limits  GLUCOSE, CAPILLARY - Abnormal; Notable for the following components:   Glucose-Capillary 181 (*)    All other components within normal limits  GLUCOSE, CAPILLARY - Abnormal; Notable for the following components:   Glucose-Capillary 216 (*)    All other components within normal limits  BASIC METABOLIC PANEL - Abnormal; Notable for the following components:   Creatinine, Ser 0.35 (*)    Calcium 8.8 (*)    All other components within normal limits  CBC - Abnormal; Notable for the following components:   WBC 11.9 (*)    RBC 3.42 (*)    Hemoglobin 10.3 (*)    HCT 30.3 (*)    All other components within normal limits  GLUCOSE, CAPILLARY - Abnormal; Notable for the following components:   Glucose-Capillary 275 (*)    All other components within normal limits  GLUCOSE, CAPILLARY - Abnormal; Notable for the following components:   Glucose-Capillary 232 (*)    All other components within normal limits  GLUCOSE, CAPILLARY - Abnormal; Notable for the following components:   Glucose-Capillary 123 (*)    All other components within normal limits  GLUCOSE, CAPILLARY - Abnormal; Notable for the following components:   Glucose-Capillary 125 (*)    All other components within normal limits  GLUCOSE, CAPILLARY - Abnormal; Notable for the following components:   Glucose-Capillary 130 (*)    All other components within normal limits  CBG MONITORING, ED - Abnormal; Notable for the following components:    Glucose-Capillary 425 (*)    All other components within normal limits  CBG MONITORING, ED - Abnormal; Notable for the following components:   Glucose-Capillary 334 (*)    All other components within normal limits  RESP PANEL BY RT-PCR (FLU A&B, COVID) [id number]  LIPASE, BLOOD  LACTIC ACID, PLASMA  RAPID URINE DRUG SCREEN, HOSP PERFORMED  GLUCOSE, CAPILLARY    EKG None  Radiology No results found.  Procedures Procedures   Medications Ordered in ED Medications  sodium chloride 0.9 % bolus 1,000 mL (0 mLs Intravenous Stopped 05/12/21 1701)  sodium chloride 0.9 % bolus 1,000 mL (1,000 mLs Intravenous New Bag/Given 05/12/21 1813)  magnesium sulfate IVPB 2 g 50 mL (2 g Intravenous  New Bag/Given 05/12/21 2345)  potassium chloride 10 mEq in 100 mL IVPB (10 mEq Intravenous New Bag/Given 05/13/21 0428)  mirtazapine (REMERON) tablet 30 mg (30 mg Oral Given 05/13/21 2115)    ED Course  I have reviewed the triage vital signs and the nursing notes.  Pertinent labs & imaging results that were available during my care of the patient were reviewed by me and considered in my medical decision making (see chart for details).    MDM Rules/Calculators/A&P                           Repeat labs for electrolyte abnormalities which were ER.  Imaging consistent with continued gastric dilatation concerning for delayed or obstructed gastric outlet.  Hospitalist consulted for admission.  Final Clinical Impression(s) / ED Diagnoses Final diagnoses:  Abdominal pain  Intractable vomiting without nausea, unspecified vomiting type  AKI (acute kidney injury) ([place])    Rx / DC Orders ED Discharge Orders          Ordered    insulin glargine (LANTUS SOLOSTAR) 100 UNIT/ML Solostar Pen  Daily,   Status:  Discontinued        05/14/21 1401    insulin glargine (LANTUS SOLOSTAR) 100 UNIT/ML Solostar Pen  Daily,   Status:  Discontinued        05/14/21 1540    insulin glargine (LANTUS SOLOSTAR) 100 UNIT/ML  Solostar Pen  2 times daily        05/14/21 1549             Luna Fuse, MD 05/17/21 2257

## 2021-05-19 ENCOUNTER — Telehealth: Payer: Self-pay

## 2021-05-19 ENCOUNTER — Other Ambulatory Visit: Payer: Self-pay | Admitting: Nurse Practitioner

## 2021-05-19 ENCOUNTER — Other Ambulatory Visit: Payer: Self-pay

## 2021-05-19 ENCOUNTER — Encounter: Payer: Self-pay | Admitting: Hematology and Oncology

## 2021-05-19 DIAGNOSIS — I10 Essential (primary) hypertension: Secondary | ICD-10-CM

## 2021-05-19 MED ORDER — ATORVASTATIN CALCIUM 20 MG PO TABS
20.0000 mg | ORAL_TABLET | Freq: Every day | ORAL | 0 refills | Status: DC
  Filled 2021-05-19: qty 30, 30d supply, fill #0
  Filled 2021-06-15: qty 30, 30d supply, fill #1
  Filled 2021-07-03 – 2021-08-13 (×3): qty 30, 30d supply, fill #2

## 2021-05-19 MED ORDER — LOSARTAN POTASSIUM 50 MG PO TABS
50.0000 mg | ORAL_TABLET | Freq: Every day | ORAL | 1 refills | Status: DC
Start: 2021-05-19 — End: 2022-01-21
  Filled 2021-05-19: qty 30, 30d supply, fill #0
  Filled 2021-07-03: qty 30, 30d supply, fill #1
  Filled 2021-08-11: qty 30, 30d supply, fill #2
  Filled 2021-08-13: qty 30, 30d supply, fill #3
  Filled 2021-09-10 (×2): qty 30, 30d supply, fill #0
  Filled 2021-10-02: qty 30, 30d supply, fill #1
  Filled 2021-11-30: qty 30, 30d supply, fill #2

## 2021-05-19 NOTE — Telephone Encounter (Signed)
Transition Care Management Follow-up Telephone Call Date of discharge and from where: [facility] on 05/14/2021 How have you been since you were released from the hospital? He said she is feeling ok. He is doing chemo and being on a liquid diet at this time.  Any questions or concerns?  Yes -  he would like a refill on his Lipitor and Losartan meds if possible prior to the appt.    Items Reviewed: Did the pt receive and understand the discharge instructions provided? Yes  Medications obtained and verified? Yes - he said she has all medications and did not have any questions about her med regime.  he said she is aware of how to take it.  Other? No  Any new allergies since your discharge? No  Do you have support at home? No , self   Home Care and Equipment/Supplies: Were home health services ordered? has Walker at home   Functional Questionnaire: (I = Independent and D = Dependent) ADLs: independent. Uses walker with ambulation.     Follow up appointments reviewed:   PCP Hospital f/u appt confirmed? Yes  Scheduled to see NP [name] on 06/06/2021 Specialist Hospital f/u appt confirmed? Yes   Are transportation arrangements needed? No  If their condition worsens, is the pt aware to call PCP or go to the Emergency Dept.? Yes Was the patient provided with contact information for the PCP's office or ED? Yes Was to pt encouraged to call back with questions or concerns? Yes

## 2021-05-20 ENCOUNTER — Other Ambulatory Visit: Payer: Self-pay

## 2021-05-20 ENCOUNTER — Encounter: Payer: Self-pay | Admitting: Hematology and Oncology

## 2021-05-22 ENCOUNTER — Inpatient Hospital Stay: Payer: Medicaid Other

## 2021-05-22 ENCOUNTER — Inpatient Hospital Stay: Payer: Medicaid Other | Admitting: Nutrition

## 2021-05-22 ENCOUNTER — Inpatient Hospital Stay: Payer: Medicaid Other | Admitting: General Practice

## 2021-05-22 ENCOUNTER — Other Ambulatory Visit: Payer: Self-pay

## 2021-05-22 ENCOUNTER — Inpatient Hospital Stay (HOSPITAL_BASED_OUTPATIENT_CLINIC_OR_DEPARTMENT_OTHER): Payer: Medicaid Other | Admitting: Hematology and Oncology

## 2021-05-22 VITALS — BP 135/76 | HR 62 | Temp 98.6°F | Resp 18 | Ht 69.0 in | Wt 144.5 lb

## 2021-05-22 DIAGNOSIS — C250 Malignant neoplasm of head of pancreas: Secondary | ICD-10-CM

## 2021-05-22 DIAGNOSIS — Z95828 Presence of other vascular implants and grafts: Secondary | ICD-10-CM

## 2021-05-22 DIAGNOSIS — Z5111 Encounter for antineoplastic chemotherapy: Secondary | ICD-10-CM | POA: Diagnosis not present

## 2021-05-22 LAB — CMP (CANCER CENTER ONLY)
ALT: 15 U/L (ref 0–44)
AST: 22 U/L (ref 15–41)
Albumin: 3.3 g/dL — ABNORMAL LOW (ref 3.5–5.0)
Alkaline Phosphatase: 120 U/L (ref 38–126)
Anion gap: 12 (ref 5–15)
BUN: 17 mg/dL (ref 6–20)
CO2: 26 mmol/L (ref 22–32)
Calcium: 10 mg/dL (ref 8.9–10.3)
Chloride: 93 mmol/L — ABNORMAL LOW (ref 98–111)
Creatinine: 0.78 mg/dL (ref 0.61–1.24)
GFR, Estimated: >60 mL/min (ref >60)
Glucose, Bld: 462 mg/dL — ABNORMAL HIGH (ref 70–99)
Potassium: 4.5 mmol/L (ref 3.5–5.1)
Sodium: 131 mmol/L — ABNORMAL LOW (ref 135–145)
Total Bilirubin: 0.7 mg/dL (ref 0.3–1.2)
Total Protein: 7.8 g/dL (ref 6.5–8.1)

## 2021-05-22 LAB — CBC WITH DIFFERENTIAL (CANCER CENTER ONLY)
Abs Immature Granulocytes: 0.09 10*3/uL — ABNORMAL HIGH (ref 0.00–0.07)
Basophils Absolute: 0.1 10*3/uL (ref 0.0–0.1)
Basophils Relative: 1 %
Eosinophils Absolute: 0.2 10*3/uL (ref 0.0–0.5)
Eosinophils Relative: 1 %
HCT: 37 % — ABNORMAL LOW (ref 39.0–52.0)
Hemoglobin: 12.3 g/dL — ABNORMAL LOW (ref 13.0–17.0)
Immature Granulocytes: 1 %
Lymphocytes Relative: 15 %
Lymphs Abs: 2.2 10*3/uL (ref 0.7–4.0)
MCH: 29.4 pg (ref 26.0–34.0)
MCHC: 33.2 g/dL (ref 30.0–36.0)
MCV: 88.3 fL (ref 80.0–100.0)
Monocytes Absolute: 1.8 10*3/uL — ABNORMAL HIGH (ref 0.1–1.0)
Monocytes Relative: 12 %
Neutro Abs: 10.8 10*3/uL — ABNORMAL HIGH (ref 1.7–7.7)
Neutrophils Relative %: 70 %
Platelet Count: 330 10*3/uL (ref 150–400)
RBC: 4.19 MIL/uL — ABNORMAL LOW (ref 4.22–5.81)
RDW: 14.2 % (ref 11.5–15.5)
WBC Count: 15.2 10*3/uL — ABNORMAL HIGH (ref 4.0–10.5)
nRBC: 0 % (ref 0.0–0.2)

## 2021-05-22 MED ORDER — SODIUM CHLORIDE 0.9 % IV SOLN
150.0000 mg/m2 | Freq: Once | INTRAVENOUS | Status: AC
  Administered 2021-05-22: 280 mg via INTRAVENOUS
  Filled 2021-05-22: qty 14

## 2021-05-22 MED ORDER — SODIUM CHLORIDE 0.9% FLUSH: 10.0000 mL | INTRAVENOUS | Status: DC | PRN

## 2021-05-22 MED ORDER — SODIUM CHLORIDE 0.9 % IV SOLN
400.0000 mg/m2 | Freq: Once | INTRAVENOUS | Status: AC
  Administered 2021-05-22: 748 mg via INTRAVENOUS
  Filled 2021-05-22: qty 37.4

## 2021-05-22 MED ORDER — SODIUM CHLORIDE 0.9 % IV SOLN
2400.0000 mg/m2 | INTRAVENOUS | Status: DC
  Administered 2021-05-22: 4500 mg via INTRAVENOUS
  Filled 2021-05-22: qty 90

## 2021-05-22 MED ORDER — SODIUM CHLORIDE 0.9 % IV SOLN
10.0000 mg | Freq: Once | INTRAVENOUS | Status: AC
  Administered 2021-05-22: 10 mg via INTRAVENOUS
  Filled 2021-05-22: qty 10

## 2021-05-22 MED ORDER — SODIUM CHLORIDE 0.9% FLUSH
10.0000 mL | Freq: Once | INTRAVENOUS | Status: AC
  Administered 2021-05-22: 10 mL

## 2021-05-22 MED ORDER — SODIUM CHLORIDE 0.9 % IV SOLN
150.0000 mg | Freq: Once | INTRAVENOUS | Status: AC
  Administered 2021-05-22: 150 mg via INTRAVENOUS
  Filled 2021-05-22: qty 150

## 2021-05-22 MED ORDER — DEXTROSE 5 % IV SOLN: Freq: Once | INTRAVENOUS | Status: AC

## 2021-05-22 MED ORDER — ATROPINE SULFATE 1 MG/ML IJ SOLN: 0.5000 mg | Freq: Once | INTRAMUSCULAR | Status: DC | PRN

## 2021-05-22 MED ORDER — OXALIPLATIN CHEMO INJECTION 100 MG/20ML
150.0000 mg | Freq: Once | INTRAVENOUS | Status: AC
  Administered 2021-05-22: 150 mg via INTRAVENOUS
  Filled 2021-05-22: qty 20

## 2021-05-22 MED ORDER — PALONOSETRON HCL INJECTION 0.25 MG/5ML
0.2500 mg | Freq: Once | INTRAVENOUS | Status: AC
  Administered 2021-05-22: 0.25 mg via INTRAVENOUS
  Filled 2021-05-22: qty 5

## 2021-05-22 NOTE — Progress Notes (Signed)
Patient is seeing endocrinologist soon. Ok to proceed with chemo today per MD.  Acquanetta Belling, RPH, BCPS, BCOP 05/22/2021 11:39 AM

## 2021-05-22 NOTE — Progress Notes (Signed)
Nutrition follow-up completed with patient during infusion for pancreas cancer. Patient reports he is exploring advancing to soft diet.  He mentioned foods like scrambled eggs with cheese. Reports he is drinking 6 Glucerna 1.5 daily.  He is asking for coupons. Noted labs: Sodium 131 and glucose 462.  Patient is on Lantus and sliding scale insulin.  MD Notes patient will be going to endocrinologist soon.   He has no questions or concerns regarding present diet.  Nutrition diagnosis: Unintended weight loss continues.  Intervention: Continue adequate calorie and protein intake with full liquid diet and supplements as needed.  Diet to advance per MD. Provided coupons for Glucerna.  Recommended patient consume 3 cartons a day between meals. Stressed importance of controlling blood sugars.  Monitoring, evaluation, goals: Patient will tolerate adequate calories and protein to minimize weight loss.  Next visit: To be scheduled as needed.  Patient has RD contact information.  **Disclaimer: This note was dictated with voice recognition software. Similar sounding words can inadvertently be transcribed and this note may contain transcription errors which may not have been corrected upon publication of note.**

## 2021-05-22 NOTE — Progress Notes (Signed)
[name] CSW Progress Notes  Brief check in visit in infusion.  Patient has located housing w help of family, hopes to move in in the next few days.  Will have fully equipped kitchen and space for caregiver if needed.  Has Medicaid, encouraged him to contact DSS to determine how to submit unpaid medical bills from past treatment.  Is working w [employer] on his application for Social Security disability.  No needs at this time, he knows to contact us if needs arise in future.  Edwyna Shell, LCSW Clinical Social Worker Phone:  [phone]

## 2021-05-22 NOTE — Progress Notes (Signed)
[place] [phone]   [phone]  PROGRESS NOTE  Patient Care Team: [name] Pounds, NP as PCP - General (Nurse Practitioner) Havery Moros, Carlota Raspberry, MD as Consulting Physician (Gastroenterology) [name] Slick, MD as Consulting Physician (Oncology) [name] Bake, RN as Registered Nurse  Hematological/Oncological History # Adenocarcinoma of the Pancreas, Stage IIB 02/26/2021: patient seen by GI for jaundice and pancreatic mass causing CBD obstruction. CT abdomen showed pancreatic head mass (4.4 x 3.7 cm) and enlarged portocaval lymph nodes. 02/27/2021: ERCP with placement of a nonmetallic biliary stent. FNA of pancreatic head showed malignant cells consistent with pancreatic adenocarcinoma. 03/05/2021: CT Chest showed no evidence of metastatic disease in the chest. 03/12/2021: establish care with Dr. Lorenso [name] 04/10/2021: Cycle 1 Day 1 of FOLFIRINOX 04/24/2021: Cycle 2 Day 1 of FOLFIRINOX 05/22/2021: Cycle 3 Day 1 of FOLFIRINOX. Delayed x 2 weeks due to hospitalization for poor po intake and dehydration due to gastroparesis.  Interval History:  [name] [name] 60 y.o. male with medical history significant for borderline resectable pancreatic cancer presents for a follow up visit. The patient's last visit was on 05/16/2021. He presents to start cycle 3 of FOLFIRINOX.  On exam today [name] is accompanied by his mom. He reports he has been having some issues with gastric reflux and is continuing to eat a liquid diet only.  Unfortunately has lost 3 [name] in the interim since his last visit.  He reports that he is still producing formed stools as not struggling as much with nausea or vomiting.  He currently denies any fevers, chills, sweats, nausea, ming or diarrhea.  Full 10 point ROS is listed below.  He is willing and able to proceed with treatment at this time.  MEDICAL HISTORY:  Past Medical History:  Diagnosis Date   Barrett's esophagus dx 2016    Bronchitis    Chronic hepatitis C without hepatic coma ([place]) 10/31/2014   Depression    Diabetes ([place])    ED (erectile dysfunction)    GERD (gastroesophageal reflux disease)    Hepatitis C    Hiatal hernia 10/2014   3cm   Neuropathy    S/P transmetatarsal amputation of foot, left ([place]) 11/28/2020    SURGICAL HISTORY: Past Surgical History:  Procedure Laterality Date   AMPUTATION Left 11/15/2020   Procedure: LEFT TRANSMETATARSALS AMPUTATION;  Surgeon: Newt Minion, MD;  Location: [facility];  Service: Orthopedics;  Laterality: Left;   APPENDECTOMY     BILIARY STENT PLACEMENT N/A 02/27/2021   Procedure: BILIARY STENT PLACEMENT;  Surgeon: Milus Banister, MD;  [facility]: [facility];  Service: Endoscopy;  Laterality: N/A;   BILIARY STENT PLACEMENT N/A 03/27/2021   Procedure: BILIARY STENT PLACEMENT;  Surgeon: Jackquline Denmark, MD;  [facility]: [facility];  Service: Endoscopy;  Laterality: N/A;   BIOPSY  03/27/2021   Procedure: BIOPSY;  Surgeon: Jackquline Denmark, MD;  [facility];  Service: Endoscopy;;   COLONOSCOPY N/A 02/16/2014   Procedure: COLONOSCOPY;  Surgeon: Gatha Mayer, MD;  [facility];  Service: Endoscopy;  Laterality: N/A;   COLONOSCOPY     ENDOSCOPIC RETROGRADE CHOLANGIOPANCREATOGRAPHY (ERCP) WITH PROPOFOL N/A 02/27/2021   Procedure: ENDOSCOPIC RETROGRADE CHOLANGIOPANCREATOGRAPHY (ERCP) WITH PROPOFOL;  Surgeon: Milus Banister, MD;  [facility];  Service: Endoscopy;  Laterality: N/A;   ERCP N/A 03/27/2021   Procedure: ENDOSCOPIC RETROGRADE CHOLANGIOPANCREATOGRAPHY (ERCP);  Surgeon: Jackquline Denmark, MD;  Location: [facility];  Service: Endoscopy;  Laterality: N/A;   ESOPHAGOGASTRODUODENOSCOPY (EGD) WITH PROPOFOL  N/A 05/06/2021   Procedure: ESOPHAGOGASTRODUODENOSCOPY (EGD) WITH PROPOFOL;  Surgeon: Mauri Pole, MD;  [facility];  Service: Endoscopy;  Laterality: N/A;   EUS N/A 02/27/2021   Procedure: UPPER ENDOSCOPIC ULTRASOUND (EUS)  RADIAL;  Surgeon: Milus Banister, MD;  [facility];  Service: Endoscopy;  Laterality: N/A;   FINE NEEDLE ASPIRATION N/A 02/27/2021   Procedure: FINE NEEDLE ASPIRATION (FNA) LINEAR;  Surgeon: Milus Banister, MD;  [facility];  Service: Endoscopy;  Laterality: N/A;   IR IMAGING GUIDED PORT INSERTION  03/21/2021   SPHINCTEROTOMY  02/27/2021   Procedure: SPHINCTEROTOMY;  Surgeon: Milus Banister, MD;  [facility];  Service: Endoscopy;;   STENT REMOVAL  03/27/2021   Procedure: Lavell Islam REMOVAL;  Surgeon: Jackquline Denmark, MD;  [facility];  Service: Endoscopy;;   TONSILLECTOMY      SOCIAL HISTORY: Social History   Socioeconomic History   Marital status: Single    Spouse name: Not on file   Number of children: Not on file   Years of education: Not on file   Highest education level: Not on file  Occupational History   Not on file  Tobacco Use   Smoking status: Every Day    Packs/day: 0.50    Years: 35.00    Pack years: 17.50    Types: Cigarettes   Smokeless tobacco: Never  Vaping Use   Vaping Use: Never used  Substance and Sexual Activity   Alcohol use: Not Currently    Comment: previous   Drug use: No   Sexual activity: Not on file  Other Topics Concern   Not on file  Social History Narrative   Not on file   Social Determinants of Health   Financial Resource Strain: High Risk   Difficulty of Paying Living Expenses: Very hard  Food Insecurity: Food Insecurity Present   Worried About Charity fundraiser in the Last Year: Sometimes true   Arboriculturist in the Last Year: Sometimes true  Transportation Needs: Public librarian (Medical): Yes   Lack of Transportation (Non-Medical): Yes  Physical Activity: Not on file  Stress: Not on file  Social Connections: Not on file  Intimate Partner Violence: Not on file    FAMILY HISTORY: Family History  Problem Relation Age of Onset   Breast cancer Mother     Stroke Father    Alcohol abuse Father    Heart disease Maternal Grandfather    Pancreatic cancer Paternal Grandmother    Diabetes Paternal Grandfather    Colon cancer Neg Hx    Stomach cancer Neg Hx    Rectal cancer Neg Hx    Esophageal cancer Neg Hx     ALLERGIES:  is allergic to bee venom and lactose intolerance (gi).  MEDICATIONS:  Current Outpatient Medications  Medication Sig Dispense Refill   albuterol (VENTOLIN HFA) 108 (90 Base) MCG/ACT inhaler Inhale 2 puffs into the lungs every 6 (six) hours as needed for wheezing or shortness of breath. 18 g 0   atorvastatin (LIPITOR) 20 MG tablet Take 1 tablet (20 mg total) by mouth daily. 90 tablet 0   diphenoxylate-atropine (LOMOTIL) 2.5-0.025 MG tablet Take 1 tablet by mouth 4 (four) times daily as needed for diarrhea or loose stools. (Patient not taking: Reported on 05/15/2021) 60 tablet 1   DULoxetine (CYMBALTA) 60 MG capsule Take 1 capsule (60 mg total) by mouth daily. 90 capsule 0   EPINEPHrine 0.3 mg/0.3 mL  IJ SOAJ injection Inject 0.3 mg into the muscle once as needed for anaphylaxis. (Patient not taking: Reported on 8/78/6767)     folic acid (FOLVITE) 1 MG tablet Take 1 tablet (1 mg total) by mouth in the morning.     glucose blood (TRUE METRIX BLOOD GLUCOSE TEST) test strip Use as instructed. Check blood glucose level by fingerstick 3-4 times per day.  E11.65 200 each 12   insulin glargine (LANTUS SOLOSTAR) 100 UNIT/ML Solostar Pen Inject 10 Units into the skin 2 (two) times daily. 15 mL 0   insulin lispro (HUMALOG) 100 UNIT/ML KwikPen Inject 0-9 Units into the skin 3 (three) times daily. Sliding scale CBG 70 - 120: 0 units CBG 121 - 150: 1 unit,  CBG 151 - 200: 2 units,  CBG 201 - 250: 3 units,  CBG 251 - 300: 5 units,  CBG 301 - 350: 7 units,  CBG 351 - 400: 9 units   CBG > 400: 9 units and notify your MD (Patient not taking: No sig reported) 15 mL 11   Insulin Pen Needle (TECHLITE PEN NEEDLES) 32G X 4 MM MISC USE AS INSTRUCTED  INTO THE SKIN TWICE DAILY 100 each 11   Insulin Pen Needle (TRUEPLUS PEN NEEDLES) 32G X 4 MM MISC Use as instructed. Inject into the skin twice daily 200 each 6   lidocaine-prilocaine (EMLA) cream Apply 1 application topically as directed as needed. (Patient taking differently: Apply 1 application topically once as needed (prior to port access).) 30 g 0   lipase/protease/amylase (CREON) 36000 UNITS CPEP capsule Take 2 capsules (72,000 Units total) by mouth 3 (three) times daily with meals. May also take 1 capsule (36,000 Units total) as needed (with snacks). NEEDS PASS. 210 capsule 3   losartan (COZAAR) 50 MG tablet Take 1 tablet (50 mg total) by mouth daily. 90 tablet 1   magic mouthwash w/lidocaine SOLN Take 5 mLs by mouth 4 (four) times daily as needed. (Patient taking differently: Take 5 mLs by mouth 4 (four) times daily as needed for mouth pain.) 400 mL 0   metoCLOPramide (REGLAN) 5 MG tablet Take 1 tablet (5 mg total) by mouth 4 (four) times daily -  before meals and at bedtime. 90 tablet 1   mirtazapine (REMERON) 30 MG tablet Take 1 tablet (30 mg total) by mouth at bedtime. 90 tablet 1   Multiple Vitamins-Minerals (THEREMS-M) TABS Take 1 tablet by mouth in the morning.     omeprazole (PRILOSEC) 40 MG capsule Take 1 capsule (40 mg total) by mouth in the morning.     ondansetron (ZOFRAN) 8 MG tablet Take 1 tablet (8 mg total) by mouth 2 (two) times daily as needed. Start on day 3 after chemotherapy. (Patient taking differently: Take 8 mg by mouth 2 (two) times daily as needed for nausea or vomiting. Start on day 3 after chemotherapy.) 30 tablet 1   polyethylene glycol (MIRALAX / GLYCOLAX) 17 g packet Take 17 g by mouth at bedtime. (Patient not taking: No sig reported) 14 each 0   prochlorperazine (COMPAZINE) 10 MG tablet Take 1 tablet (10 mg total) by mouth every 6 (six) hours as needed (Nausea or vomiting). 30 tablet 1   TRUEplus Lancets 28G MISC Use as instructed. Check blood glucose level by  fingerstick 3-4 times per day.   E11.65 200 each 3   No current facility-administered medications for this visit.   Facility-Administered Medications Ordered in Other Visits  Medication Dose Route Frequency Provider Last Rate  Last Admin   atropine injection 0.5 mg  0.5 mg Intravenous Once PRN [name] Slick, MD       fluorouracil (ADRUCIL) 4,500 mg in sodium chloride 0.9 % 60 mL chemo infusion  2,400 mg/m2 (Treatment Plan Recorded) Intravenous 1 day or 1 dose [name] Slick, MD       irinotecan (CAMPTOSAR) 280 mg in sodium chloride 0.9 % 500 mL chemo infusion  150 mg/m2 (Treatment Plan Recorded) Intravenous Once [name] Slick, MD       leucovorin 748 mg in sodium chloride 0.9 % 250 mL infusion  400 mg/m2 (Treatment Plan Recorded) Intravenous Once [name] Slick, MD       oxaliplatin (ELOXATIN) 150 mg in dextrose 5 % 500 mL chemo infusion  150 mg Intravenous Once [name] [name] IV, MD 265 mL/hr at 05/22/21 1156 150 mg at 05/22/21 1156   sodium chloride flush (NS) 0.9 % injection 10 mL  10 mL Intracatheter PRN [name] Slick, MD        REVIEW OF SYSTEMS:   Constitutional: ( - ) fevers, ( - )  chills , ( - ) night sweats Eyes: ( - ) blurriness of vision, ( - ) double vision, ( - ) watery eyes Ears, nose, mouth, throat, and face: ( - ) mucositis, ( - ) sore throat Respiratory: ( - ) cough, ( - ) dyspnea, ( - ) wheezes Cardiovascular: ( - ) palpitation, ( - ) chest discomfort, ( - ) lower extremity swelling Gastrointestinal:  ( - ) nausea, ( - ) heartburn, ( - ) change in bowel habits Skin: ( - ) abnormal skin rashes Lymphatics: ( - ) new lymphadenopathy, ( - ) easy bruising Neurological: ( - ) numbness, ( - ) tingling, ( - ) new weaknesses Behavioral/Psych: ( - ) mood change, ( - ) new changes  All other systems were reviewed with the patient and are negative.  PHYSICAL EXAMINATION: ECOG PERFORMANCE STATUS: 1 - Symptomatic but completely ambulatory  Vitals:   05/22/21 0952   BP: 135/76  Pulse: 62  Resp: 18  Temp: 98.6 F (37 C)  SpO2: 99%    Filed Weights   05/22/21 0952  Weight: 144 lb 8 oz (65.5 kg)    GENERAL: Well-appearing middle-age Caucasian male, alert, no distress and comfortable SKIN: skin color, texture, turgor are normal, no rashes or significant lesions.  EYES: conjunctiva are pink and non-injected, sclera clear LUNGS: clear to auscultation and percussion with normal breathing effort HEART: regular rate & rhythm and no murmurs and no lower extremity edema PSYCH: alert & oriented x 3, fluent speech NEURO: no focal motor/sensory deficits  LABORATORY DATA:  I have reviewed the data as listed CBC Latest Ref Rng & Units 05/22/2021 05/16/2021 05/14/2021  WBC 4.0 - 10.5 K/uL 15.2(H) 11.3(H) 11.9(H)  Hemoglobin 13.0 - 17.0 g/dL 12.3(L) 11.0(L) 10.3(L)  Hematocrit 39.0 - 52.0 % 37.0(L) 32.4(L) 30.3(L)  Platelets 150 - 400 K/uL 330 356 349    CMP Latest Ref Rng & Units 05/22/2021 05/16/2021 05/14/2021  Glucose 70 - 99 mg/dL 462(H) 310(H) 72  BUN 6 - 20 mg/dL 17 12 12   Creatinine 0.61 - 1.24 mg/dL 0.78 0.64 0.35(L)  Sodium 135 - 145 mmol/L 131(L) 132(L) 139  Potassium 3.5 - 5.1 mmol/L 4.5 4.0 3.7  Chloride 98 - 111 mmol/L 93(L) 97(L) 106  CO2 22 - 32 mmol/L 26 24 26   Calcium 8.9 - 10.3 mg/dL 10.0 9.0  8.8(L)  Total Protein 6.5 - 8.1 g/dL 7.8 7.2 -  Total Bilirubin 0.3 - 1.2 mg/dL 0.7 0.7 -  Alkaline Phos 38 - 126 U/L 120 100 -  AST 15 - 41 U/L 22 20 -  ALT 0 - 44 U/L 15 12 -    RADIOGRAPHIC STUDIES: I have personally reviewed the radiological images as listed and agreed with the findings in the report: Borderline resectable pancreatic mass with no evidence of metastatic disease. DG Abd 1 View  Result Date: 05/07/2021 CLINICAL DATA:  Left lower quadrant abdominal pain EXAM: ABDOMEN - 1 VIEW COMPARISON:  CT abdomen/pelvis 03/28/2021 FINDINGS: There is a nonobstructive bowel gas pattern. There is no gross organomegaly or abnormal soft tissue  calcification. A biliary stent is noted. There is associated pneumobilia projecting over the liver. There is no acute osseous abnormality. IMPRESSION: No acute abnormality identified. Electronically Signed   By: Valetta Mole M.D.   On: 05/07/2021 14:38   CT Abdomen Pelvis W Contrast  Result Date: 05/04/2021 CLINICAL DATA:  Diverticulitis suspected abdominal pain Patient reports left lower quadrant pain. Fever. Nausea and vomiting. History of pancreatic cancer, active chemotherapy. EXAM: CT ABDOMEN AND PELVIS WITH CONTRAST TECHNIQUE: Multidetector CT imaging of the abdomen and pelvis was performed using the standard protocol following bolus administration of intravenous contrast. CONTRAST:  65mL OMNIPAQUE IOHEXOL 350 MG/ML SOLN COMPARISON:  Most recent CT noncontrast exam 02/26/2021. FINDINGS: Lower chest: Hypoventilatory atelectasis in the right lower lobe. No pulmonary nodule or pleural effusion. Fluid-filled dilated distal esophagus. Normal heart size with coronary artery calcifications. Hepatobiliary: Pneumobilia likely related to intervening ERCP. Common bile duct stent is in place. No intrahepatic biliary ductal dilatation. Decreased gallbladder distension from prior exam. Equivocal gallbladder wall thickening but no pericholecystic fat stranding. Suspected tiny gallstones. Pancreas: Pancreatic head mass is difficult to delineate on the current exam, however appears decreased in size from prior currently measuring approximately 2.8 x 2.9 cm, series 2, image 40. There is distal pancreatic atrophy and ductal dilatation with pancreatic duct measuring 4 mm. No definite peripancreatic fat stranding or inflammation. Spleen: Normal in size without focal abnormality. Adrenals/Urinary Tract: No adrenal nodule. No hydronephrosis or perinephric edema. Homogeneous renal enhancement with symmetric excretion on delayed phase imaging. Tiny hypodensity in the mid lower right kidney is too small to characterize. Urinary  bladder is physiologically distended without wall thickening. Stomach/Bowel: Fluid distends the distal esophagus which is mildly dilated. The stomach is markedly dilated and fluid-filled. There may be mass effect on the gastroduodenal junction, series 2, image 38, from pancreatic mass, difficult to accurately define bowel in this region due to lack of enteric contrast. Duodenum is difficult to define, however there is fluid within the duodenum which is nondistended. Few fluid-filled small bowel loops without abnormal small bowel distension or inflammation. Appendix not visualized, appendectomy per history. Moderate volume of colonic stool. There is no colonic wall thickening or inflammation. Diverticulosis from the splenic flexure distally. No diverticulitis. Vascular/Lymphatic: Moderate aortic and branch atherosclerosis. Portal caval node measuring 1.2 x 2.3 cm, not significantly changed in size allowing for differences in caliper placement. Periportal node has likely increased from prior, 15 mm short axis series 2, image 26. There are multiple additional prominent nodes in the porta hepatis. No portal vein thrombus. The superior mesenteric vein is attenuated. The splenic vein is attenuated. No evidence of thrombus or occlusion. There are some left upper quadrant collaterals. Reproductive: Prostatic calcifications. Other: No ascites.  No free air.  No omental thickening. Musculoskeletal:  Degenerative change in the lumbar spine with primarily facet hypertrophy. No focal bone lesion or acute osseous abnormalities are seen IMPRESSION: 1. Markedly dilated fluid-filled stomach, with fluid distending the distal esophagus. This may be due to mass effect from pancreatic mass, however the soft tissue planes adjacent to the distal stomach are not well-defined on this current exam in the absence enteric contrast. 2. Common bile duct stent in place with pneumobilia. Decreased gallbladder distension from prior exam. Equivocal  gallbladder wall thickening but no pericholecystic fat stranding. 3. Known pancreatic head mass is difficult to delineate, however appears decreased in size from prior exam. Periportal adenopathy, with slight increase size of some of the periportal nodes. 4. Colonic diverticulosis without diverticulitis. 5. Attenuated superior mesenteric and splenic veins without thrombus or occlusion. There are left upper quadrant collaterals. Aortic Atherosclerosis (ICD10-I70.0). Electronically Signed   By: Keith Rake M.D.   On: 05/04/2021 20:17   DG Abd 2 Views  Result Date: 05/12/2021 CLINICAL DATA:  Abdominal pain. EXAM: ABDOMEN - 2 VIEW COMPARISON:  Abdominal x-ray 05/08/2021. CT abdomen and pelvis 05/04/2021. FINDINGS: The stomach appears markedly dilated with air-fluid level. There are no dilated small bowel loops or colon. Colonic air is seen to the level the rectum. Stent is again noted in the right abdomen. There is a phlebolith in the left hemipelvis. Vascular calcifications are again noted in the left pelvis. There is no free intraperitoneal air under the diaphragm. Branching air in the right upper quadrant corresponds to pneumobilia seen on recent CT. No acute fractures are seen. There are degenerative changes of the spine and hips. IMPRESSION: 1. The stomach is dilated with large air-fluid level. This appears similar to the prior study. 2. Small bowel and colon are nondilated. Electronically Signed   By: Ronney Asters M.D.   On: 05/12/2021 16:15   DG Abd 2 Views  Result Date: 05/08/2021 CLINICAL DATA:  Abdominal distention. EXAM: ABDOMEN - 2 VIEW COMPARISON:  Radiograph earlier today.  CT 05/04/2021 FINDINGS: No evidence of free intra-abdominal air. Right upper quadrant biliary stent with resultant pneumobilia is unchanged in positioning from prior exam. Soft tissue fullness in the left upper quadrant corresponds to gastric distention on prior CT. There is no small bowel dilatation. Minimal formed stool  in the colon. IMPRESSION: 1. Soft tissue fullness in the left upper abdomen corresponds to fluid distended stomach on prior CT. No small bowel dilatation. 2. Right upper quadrant biliary stent with resultant pneumobilia is unchanged in positioning from prior exam. Electronically Signed   By: Keith Rake M.D.   On: 05/08/2021 12:49    ASSESSMENT & PLAN [name] [name] 60 y.o. male with medical history significant for borderline resectable pancreatic cancer presents for a follow up visit.   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  The patient does still have an elevation in bilirubin which likely represents a decline from the time of the latest stent placement.  He was evaluated by surgery who determined this is a borderline resectable tumor.  We will plan to start the patient on neoadjuvant chemotherapy.  The 2 options would be Gem/Abraxane or FOLFIRINOX.  Given his excellent functional status I do believe he would be a good candidate for FOLFIRINOX.   # Adenocarcinoma of the Pancreas, Stage IIB -- At this time disease appears borderline resectable.  We will proceed with neoadjuvant chemotherapy.  At this  time would prefer a regimen of FOLFIRINOX given his good baseline health. -- case reviewed by surgery, who agrees his disease is borderline resectable.  -- baseline elevations in both CA 19-9 and CEA. Continue to monitor during treatment.  --Staging scans complete.  No need for further imaging at this time. Will plan for repeat imaging in approximately 8 cycles to reassess --Reassuringly during his admission for intractable nausea and vomiting a CT scan of the abdomen showed marked response in the tumor to the chemotherapy treatment. -- Cycle 1 Day 1 of FOLFIRINOX started 04/10/2021 Plan:  -- RTC in 2 weeks for Cycle 4 of  treatment  #Gastroparesis #Nausea/Vomiting/Poor PO Intake --continue with liquid diet and gastroparesis diet --nausea meds as below. --continue to work on glycemic control --continue to monitor.    # Pain Control -- Patient currently taking ibuprofen for his abdominal pain --continue oxycodone 5 to 10 mg every 6 hours as needed   #Supportive Care -- chemotherapy education complete -- port placed -- zofran 8mg  q8H PRN and compazine 10mg  PO q6H for nausea --trial of Creon called into pharmacy to help with loose stools. -- EMLA cream for port  No orders of the defined types were placed in this encounter.  All questions were answered. The patient knows to call the clinic with any problems, questions or concerns.  A total of more than 30 minutes were spent on this encounter with face-to-face time and non-face-to-face time, including preparing to see the patient, ordering tests and/or medications, counseling the patient and coordination of care as outlined above.   [name] Peoples, MD Department of Hematology/Oncology [place] [facility] Phone: [phone] Pager: [phone] Email: [email or web address]  05/22/2021 1:04 PM

## 2021-05-24 ENCOUNTER — Inpatient Hospital Stay: Payer: Medicaid Other

## 2021-05-24 ENCOUNTER — Other Ambulatory Visit: Payer: Self-pay

## 2021-05-24 VITALS — BP 136/81 | HR 93 | Temp 98.5°F | Resp 18

## 2021-05-24 DIAGNOSIS — Z5111 Encounter for antineoplastic chemotherapy: Secondary | ICD-10-CM | POA: Diagnosis not present

## 2021-05-24 DIAGNOSIS — C250 Malignant neoplasm of head of pancreas: Secondary | ICD-10-CM

## 2021-05-24 MED ORDER — HEPARIN SOD (PORK) LOCK FLUSH 100 UNIT/ML IV SOLN
500.0000 [IU] | Freq: Once | INTRAVENOUS | Status: AC | PRN
  Administered 2021-05-24: 500 [IU]

## 2021-05-24 MED ORDER — SODIUM CHLORIDE 0.9% FLUSH
10.0000 mL | INTRAVENOUS | Status: DC | PRN
  Administered 2021-05-24: 10 mL

## 2021-05-29 ENCOUNTER — Telehealth: Payer: Self-pay | Admitting: *Deleted

## 2021-05-29 NOTE — Telephone Encounter (Signed)
Received call from patient requesting refill of his Oxycodone 5 mg tabs #60, to be filled at [facility].  Pt also asked about  his treatment for next week-it has not been scheduled. Advised that I will speak with scheduling to have this taken care of.  Pt voiced understanding. Spoke with Jerrye Bushy, scheduler and she is working on this and will notify patient when it has been scheduled.

## 2021-05-30 ENCOUNTER — Other Ambulatory Visit: Payer: Self-pay | Admitting: Hematology and Oncology

## 2021-05-30 ENCOUNTER — Telehealth: Payer: Self-pay | Admitting: Hematology and Oncology

## 2021-05-30 ENCOUNTER — Other Ambulatory Visit (HOSPITAL_COMMUNITY): Payer: Self-pay

## 2021-05-30 MED ORDER — OXYCODONE HCL 5 MG PO TABS
5.0000 mg | ORAL_TABLET | Freq: Four times a day (QID) | ORAL | 0 refills | Status: DC | PRN
  Filled 2021-05-30: qty 60, 15d supply, fill #0

## 2021-05-30 NOTE — Telephone Encounter (Signed)
Refill sent as requested. 

## 2021-05-30 NOTE — Telephone Encounter (Signed)
Scheduled per 09/22 los, patient has been called and notified. 

## 2021-06-04 ENCOUNTER — Inpatient Hospital Stay (HOSPITAL_BASED_OUTPATIENT_CLINIC_OR_DEPARTMENT_OTHER): Payer: Medicaid Other | Admitting: Hematology and Oncology

## 2021-06-04 ENCOUNTER — Encounter: Payer: Self-pay | Admitting: Hematology and Oncology

## 2021-06-04 ENCOUNTER — Other Ambulatory Visit: Payer: Self-pay

## 2021-06-04 ENCOUNTER — Inpatient Hospital Stay: Payer: Medicaid Other | Attending: Hematology and Oncology

## 2021-06-04 VITALS — BP 150/89 | HR 93 | Temp 98.7°F | Resp 18 | Wt 143.8 lb

## 2021-06-04 DIAGNOSIS — Z5111 Encounter for antineoplastic chemotherapy: Secondary | ICD-10-CM | POA: Diagnosis not present

## 2021-06-04 DIAGNOSIS — Z79899 Other long term (current) drug therapy: Secondary | ICD-10-CM | POA: Insufficient documentation

## 2021-06-04 DIAGNOSIS — F17210 Nicotine dependence, cigarettes, uncomplicated: Secondary | ICD-10-CM | POA: Diagnosis not present

## 2021-06-04 DIAGNOSIS — R112 Nausea with vomiting, unspecified: Secondary | ICD-10-CM | POA: Insufficient documentation

## 2021-06-04 DIAGNOSIS — K8689 Other specified diseases of pancreas: Secondary | ICD-10-CM | POA: Diagnosis not present

## 2021-06-04 DIAGNOSIS — C250 Malignant neoplasm of head of pancreas: Secondary | ICD-10-CM

## 2021-06-04 DIAGNOSIS — Z95828 Presence of other vascular implants and grafts: Secondary | ICD-10-CM

## 2021-06-04 DIAGNOSIS — E119 Type 2 diabetes mellitus without complications: Secondary | ICD-10-CM | POA: Insufficient documentation

## 2021-06-04 DIAGNOSIS — Z794 Long term (current) use of insulin: Secondary | ICD-10-CM | POA: Insufficient documentation

## 2021-06-04 DIAGNOSIS — K3184 Gastroparesis: Secondary | ICD-10-CM | POA: Diagnosis not present

## 2021-06-04 LAB — CMP (CANCER CENTER ONLY)
ALT: 15 U/L (ref 0–44)
AST: 27 U/L (ref 15–41)
Albumin: 3.3 g/dL — ABNORMAL LOW (ref 3.5–5.0)
Alkaline Phosphatase: 107 U/L (ref 38–126)
Anion gap: 11 (ref 5–15)
BUN: 7 mg/dL (ref 6–20)
CO2: 24 mmol/L (ref 22–32)
Calcium: 9.3 mg/dL (ref 8.9–10.3)
Chloride: 106 mmol/L (ref 98–111)
Creatinine: 0.62 mg/dL (ref 0.61–1.24)
GFR, Estimated: >60 mL/min (ref >60)
Glucose, Bld: 199 mg/dL — ABNORMAL HIGH (ref 70–99)
Potassium: 3.3 mmol/L — ABNORMAL LOW (ref 3.5–5.1)
Sodium: 141 mmol/L (ref 135–145)
Total Bilirubin: 0.4 mg/dL (ref 0.3–1.2)
Total Protein: 7.6 g/dL (ref 6.5–8.1)

## 2021-06-04 LAB — CBC WITH DIFFERENTIAL (CANCER CENTER ONLY)
Abs Immature Granulocytes: 0.01 10*3/uL (ref 0.00–0.07)
Basophils Absolute: 0 10*3/uL (ref 0.0–0.1)
Basophils Relative: 1 %
Eosinophils Absolute: 0.3 10*3/uL (ref 0.0–0.5)
Eosinophils Relative: 4 %
HCT: 35.1 % — ABNORMAL LOW (ref 39.0–52.0)
Hemoglobin: 11.8 g/dL — ABNORMAL LOW (ref 13.0–17.0)
Immature Granulocytes: 0 %
Lymphocytes Relative: 29 %
Lymphs Abs: 2 10*3/uL (ref 0.7–4.0)
MCH: 29.4 pg (ref 26.0–34.0)
MCHC: 33.6 g/dL (ref 30.0–36.0)
MCV: 87.5 fL (ref 80.0–100.0)
Monocytes Absolute: 0.9 10*3/uL (ref 0.1–1.0)
Monocytes Relative: 13 %
Neutro Abs: 3.7 10*3/uL (ref 1.7–7.7)
Neutrophils Relative %: 53 %
Platelet Count: 262 10*3/uL (ref 150–400)
RBC: 4.01 MIL/uL — ABNORMAL LOW (ref 4.22–5.81)
RDW: 14.8 % (ref 11.5–15.5)
WBC Count: 6.9 10*3/uL (ref 4.0–10.5)
nRBC: 0 % (ref 0.0–0.2)

## 2021-06-04 MED ORDER — SODIUM CHLORIDE 0.9% FLUSH
10.0000 mL | Freq: Once | INTRAVENOUS | Status: AC
Start: 2021-06-04 — End: 2021-06-04
  Administered 2021-06-04: 10 mL

## 2021-06-04 MED ORDER — HEPARIN SOD (PORK) LOCK FLUSH 100 UNIT/ML IV SOLN: 500.0000 [IU] | Freq: Once | INTRAVENOUS | Status: DC

## 2021-06-04 NOTE — Progress Notes (Signed)
Pt requested to stay accessed overnight due to having infusion in the morning. Dressing and biopatch applied.

## 2021-06-04 NOTE — Progress Notes (Signed)
[place] [phone]) [phone]   [phone]  PROGRESS NOTE  Patient Care Team: [name], NP as PCP - General (Nurse Practitioner) Havery Moros, Carlota Raspberry, MD as Consulting Physician (Gastroenterology) Orson Slick, MD as Consulting Physician (Oncology) Royston Bake, RN as Registered Nurse  Hematological/Oncological History # Adenocarcinoma of the Pancreas, Stage IIB 02/26/2021: patient seen by GI for jaundice and pancreatic mass causing CBD obstruction. CT abdomen showed pancreatic head mass (4.4 x 3.7 cm) and enlarged portocaval lymph nodes. 02/27/2021: ERCP with placement of a nonmetallic biliary stent. FNA of pancreatic head showed malignant cells consistent with pancreatic adenocarcinoma. 03/05/2021: CT Chest showed no evidence of metastatic disease in the chest. 03/12/2021: establish care with Dr. Lorenso Courier 04/10/2021: Cycle 1 Day 1 of FOLFIRINOX 04/24/2021: Cycle 2 Day 1 of FOLFIRINOX 05/22/2021: Cycle 3 Day 1 of FOLFIRINOX. Delayed x 2 weeks due to hospitalization for poor po intake and dehydration due to gastroparesis. 06/05/2021: Cycle 4 Day 1 of FOLFIRINOX  Interval History:  [name] [name] 60 y.o. male with medical history significant for borderline resectable pancreatic cancer presents for a follow up visit. The patient's last visit was on 05/22/2021. He presents to start cycle 4 of FOLFIRINOX.  On exam today [name] [name] reports he has escalated his diet rapidly in the interim since her last visit.  He notes that he went to soft diet and now is eating pizza.  He reports he forgot to use Creon 1 night and he "paid for".  He had episodes of diarrhea following that.  He notes that he has a little bit of nausea after chemotherapy but otherwise at the moment does not have any nausea or vomiting or diarrhea.  He notes that he has baseline neuropathy in his feet from his diabetes but no new neuro logical symptoms.  He has a trigger finger but otherwise no  redness of his palms or signs of hand-foot syndrome.  He currently denies any fevers, chills, sweats.  Full 10 point ROS is listed below.  He is willing and able to proceed with treatment at this time.  Of note his mom is currently admitted to [facility] long hospital for small bowel obstruction.  MEDICAL HISTORY:  Past Medical History:  Diagnosis Date   Barrett's esophagus dx 2016   Bronchitis    Chronic hepatitis C without hepatic coma ([place]) 10/31/2014   Depression    Diabetes ([place])    ED (erectile dysfunction)    GERD (gastroesophageal reflux disease)    Hepatitis C    Hiatal hernia 10/2014   3cm   Neuropathy    S/P transmetatarsal amputation of foot, left ([place]) 11/28/2020    SURGICAL HISTORY: Past Surgical History:  Procedure Laterality Date   AMPUTATION Left 11/15/2020   Procedure: LEFT TRANSMETATARSALS AMPUTATION;  Surgeon: Newt Minion, MD;  Location: [facility];  Service: Orthopedics;  Laterality: Left;   APPENDECTOMY     BILIARY STENT PLACEMENT N/A 02/27/2021   Procedure: BILIARY STENT PLACEMENT;  Surgeon: Milus Banister, MD;  Location: [facility];  Service: Endoscopy;  Laterality: N/A;   BILIARY STENT PLACEMENT N/A 03/27/2021   Procedure: BILIARY STENT PLACEMENT;  Surgeon: Jackquline Denmark, MD;  Location: [facility];  Service: Endoscopy;  Laterality: N/A;   BIOPSY  03/27/2021   Procedure: BIOPSY;  Surgeon: Jackquline Denmark, MD;  [facility];  Service: Endoscopy;;   COLONOSCOPY N/A 02/16/2014   Procedure: COLONOSCOPY;  Surgeon: Gatha Mayer, MD;  [facility];  Service:  Endoscopy;  Laterality: N/A;   COLONOSCOPY     ENDOSCOPIC RETROGRADE CHOLANGIOPANCREATOGRAPHY (ERCP) WITH PROPOFOL N/A 02/27/2021   Procedure: ENDOSCOPIC RETROGRADE CHOLANGIOPANCREATOGRAPHY (ERCP) WITH PROPOFOL;  Surgeon: Milus Banister, MD;  [facility];  Service: Endoscopy;  Laterality: N/A;   ERCP N/A 03/27/2021   Procedure: ENDOSCOPIC RETROGRADE CHOLANGIOPANCREATOGRAPHY (ERCP);   Surgeon: Jackquline Denmark, MD;  Location: [facility];  Service: Endoscopy;  Laterality: N/A;   ESOPHAGOGASTRODUODENOSCOPY (EGD) WITH PROPOFOL N/A 05/06/2021   Procedure: ESOPHAGOGASTRODUODENOSCOPY (EGD) WITH PROPOFOL;  Surgeon: Mauri Pole, MD;  [facility];  Service: Endoscopy;  Laterality: N/A;   EUS N/A 02/27/2021   Procedure: UPPER ENDOSCOPIC ULTRASOUND (EUS) RADIAL;  Surgeon: Milus Banister, MD;  [facility];  Service: Endoscopy;  Laterality: N/A;   FINE NEEDLE ASPIRATION N/A 02/27/2021   Procedure: FINE NEEDLE ASPIRATION (FNA) LINEAR;  Surgeon: Milus Banister, MD;  [facility];  Service: Endoscopy;  Laterality: N/A;   IR IMAGING GUIDED PORT INSERTION  03/21/2021   SPHINCTEROTOMY  02/27/2021   Procedure: SPHINCTEROTOMY;  Surgeon: Milus Banister, MD;  [facility];  Service: Endoscopy;;   STENT REMOVAL  03/27/2021   Procedure: Lavell Islam REMOVAL;  Surgeon: Jackquline Denmark, MD;  [facility];  Service: Endoscopy;;   TONSILLECTOMY      SOCIAL HISTORY: Social History   Socioeconomic History   Marital status: Single    Spouse name: Not on file   Number of children: Not on file   Years of education: Not on file   Highest education level: Not on file  Occupational History   Not on file  Tobacco Use   Smoking status: Every Day    Packs/day: 0.50    Years: 35.00    Pack years: 17.50    Types: Cigarettes   Smokeless tobacco: Never  Vaping Use   Vaping Use: Never used  Substance and Sexual Activity   Alcohol use: Not Currently    Comment: previous   Drug use: No   Sexual activity: Not on file  Other Topics Concern   Not on file  Social History Narrative   Not on file   Social Determinants of Health   Financial Resource Strain: High Risk   Difficulty of Paying Living Expenses: Very hard  Food Insecurity: Food Insecurity Present   Worried About Charity fundraiser in the Last Year: Sometimes true   Arboriculturist in the  Last Year: Sometimes true  Transportation Needs: Public librarian (Medical): Yes   Lack of Transportation (Non-Medical): Yes  Physical Activity: Not on file  Stress: Not on file  Social Connections: Not on file  Intimate Partner Violence: Not on file    FAMILY HISTORY: Family History  Problem Relation Age of Onset   Breast cancer Mother    Stroke Father    Alcohol abuse Father    Heart disease Maternal Grandfather    Pancreatic cancer Paternal Grandmother    Diabetes Paternal Grandfather    Colon cancer Neg Hx    Stomach cancer Neg Hx    Rectal cancer Neg Hx    Esophageal cancer Neg Hx     ALLERGIES:  is allergic to bee venom and lactose intolerance (gi).  MEDICATIONS:  Current Outpatient Medications  Medication Sig Dispense Refill   albuterol (VENTOLIN HFA) 108 (90 Base) MCG/ACT inhaler Inhale 2 puffs into the lungs every 6 (six) hours as needed for wheezing or shortness of breath. 18 g  0   atorvastatin (LIPITOR) 20 MG tablet Take 1 tablet (20 mg total) by mouth daily. 90 tablet 0   diphenoxylate-atropine (LOMOTIL) 2.5-0.025 MG tablet Take 1 tablet by mouth 4 (four) times daily as needed for diarrhea or loose stools. (Patient not taking: Reported on 05/15/2021) 60 tablet 1   DULoxetine (CYMBALTA) 60 MG capsule Take 1 capsule (60 mg total) by mouth daily. 90 capsule 0   EPINEPHrine 0.3 mg/0.3 mL IJ SOAJ injection Inject 0.3 mg into the muscle once as needed for anaphylaxis. (Patient not taking: Reported on 01/26/4131)     folic acid (FOLVITE) 1 MG tablet Take 1 tablet (1 mg total) by mouth in the morning.     glucose blood (TRUE METRIX BLOOD GLUCOSE TEST) test strip Use as instructed. Check blood glucose level by fingerstick 3-4 times per day.  E11.65 200 each 12   insulin glargine (LANTUS SOLOSTAR) 100 UNIT/ML Solostar Pen Inject 10 Units into the skin 2 (two) times daily. 15 mL 0   insulin lispro (HUMALOG) 100 UNIT/ML KwikPen Inject 0-9 Units  into the skin 3 (three) times daily. Sliding scale CBG 70 - 120: 0 units CBG 121 - 150: 1 unit,  CBG 151 - 200: 2 units,  CBG 201 - 250: 3 units,  CBG 251 - 300: 5 units,  CBG 301 - 350: 7 units,  CBG 351 - 400: 9 units   CBG > 400: 9 units and notify your MD (Patient not taking: No sig reported) 15 mL 11   Insulin Pen Needle (TECHLITE PEN NEEDLES) 32G X 4 MM MISC USE AS INSTRUCTED INTO THE SKIN TWICE DAILY 100 each 11   Insulin Pen Needle (TRUEPLUS PEN NEEDLES) 32G X 4 MM MISC Use as instructed. Inject into the skin twice daily 200 each 6   lidocaine-prilocaine (EMLA) cream Apply 1 application topically as directed as needed. (Patient taking differently: Apply 1 application topically once as needed (prior to port access).) 30 g 0   lipase/protease/amylase (CREON) 36000 UNITS CPEP capsule Take 2 capsules (72,000 Units total) by mouth 3 (three) times daily with meals. May also take 1 capsule (36,000 Units total) as needed (with snacks). NEEDS PASS. 210 capsule 3   losartan (COZAAR) 50 MG tablet Take 1 tablet (50 mg total) by mouth daily. 90 tablet 1   magic mouthwash w/lidocaine SOLN Take 5 mLs by mouth 4 (four) times daily as needed. (Patient taking differently: Take 5 mLs by mouth 4 (four) times daily as needed for mouth pain.) 400 mL 0   metoCLOPramide (REGLAN) 5 MG tablet Take 1 tablet (5 mg total) by mouth 4 (four) times daily -  before meals and at bedtime. 90 tablet 1   mirtazapine (REMERON) 30 MG tablet Take 1 tablet (30 mg total) by mouth at bedtime. 90 tablet 1   Multiple Vitamins-Minerals (THEREMS-M) TABS Take 1 tablet by mouth in the morning.     omeprazole (PRILOSEC) 40 MG capsule Take 1 capsule (40 mg total) by mouth in the morning.     ondansetron (ZOFRAN) 8 MG tablet Take 1 tablet (8 mg total) by mouth 2 (two) times daily as needed. Start on day 3 after chemotherapy. (Patient taking differently: Take 8 mg by mouth 2 (two) times daily as needed for nausea or vomiting. Start on day 3 after  chemotherapy.) 30 tablet 1   oxyCODONE (OXY IR/ROXICODONE) 5 MG immediate release tablet Take 1 tablet (5 mg total) by mouth every 6 (six) hours as needed for severe  pain. 60 tablet 0   polyethylene glycol (MIRALAX / GLYCOLAX) 17 g packet Take 17 g by mouth at bedtime. (Patient not taking: No sig reported) 14 each 0   prochlorperazine (COMPAZINE) 10 MG tablet Take 1 tablet (10 mg total) by mouth every 6 (six) hours as needed (Nausea or vomiting). 30 tablet 1   TRUEplus Lancets 28G MISC Use as instructed. Check blood glucose level by fingerstick 3-4 times per day.   E11.65 200 each 3   No current facility-administered medications for this visit.   Facility-Administered Medications Ordered in Other Visits  Medication Dose Route Frequency Provider Last Rate Last Admin   heparin lock flush 100 unit/mL  500 Units Intracatheter Once Orson Slick, MD        REVIEW OF SYSTEMS:   Constitutional: ( - ) fevers, ( - )  chills , ( - ) night sweats Eyes: ( - ) blurriness of vision, ( - ) double vision, ( - ) watery eyes Ears, nose, mouth, throat, and face: ( - ) mucositis, ( - ) sore throat Respiratory: ( - ) cough, ( - ) dyspnea, ( - ) wheezes Cardiovascular: ( - ) palpitation, ( - ) chest discomfort, ( - ) lower extremity swelling Gastrointestinal:  ( - ) nausea, ( - ) heartburn, ( - ) change in bowel habits Skin: ( - ) abnormal skin rashes Lymphatics: ( - ) new lymphadenopathy, ( - ) easy bruising Neurological: ( - ) numbness, ( - ) tingling, ( - ) new weaknesses Behavioral/Psych: ( - ) mood change, ( - ) new changes  All other systems were reviewed with the patient and are negative.  PHYSICAL EXAMINATION: ECOG PERFORMANCE STATUS: 1 - Symptomatic but completely ambulatory  Vitals:   06/04/21 1030  BP: (!) 150/89  Pulse: 93  Resp: 18  Temp: 98.7 F (37.1 C)  SpO2: 100%     Filed Weights   06/04/21 1030  Weight: 143 lb 12.8 oz (65.2 kg)     GENERAL: Well-appearing middle-age  Caucasian male, alert, no distress and comfortable SKIN: skin color, texture, turgor are normal, no rashes or significant lesions.  EYES: conjunctiva are pink and non-injected, sclera clear LUNGS: clear to auscultation and percussion with normal breathing effort HEART: regular rate & rhythm and no murmurs and no lower extremity edema PSYCH: alert & oriented x 3, fluent speech NEURO: no focal motor/sensory deficits  LABORATORY DATA:  I have reviewed the data as listed CBC Latest Ref Rng & Units 06/04/2021 05/22/2021 05/16/2021  WBC 4.0 - 10.5 K/uL 6.9 15.2(H) 11.3(H)  Hemoglobin 13.0 - 17.0 g/dL 11.8(L) 12.3(L) 11.0(L)  Hematocrit 39.0 - 52.0 % 35.1(L) 37.0(L) 32.4(L)  Platelets 150 - 400 K/uL 262 330 356    CMP Latest Ref Rng & Units 06/04/2021 05/22/2021 05/16/2021  Glucose 70 - 99 mg/dL 199(H) 462(H) 310(H)  BUN 6 - 20 mg/dL 7 17 12   Creatinine 0.61 - 1.24 mg/dL 0.62 0.78 0.64  Sodium 135 - 145 mmol/L 141 131(L) 132(L)  Potassium 3.5 - 5.1 mmol/L 3.3(L) 4.5 4.0  Chloride 98 - 111 mmol/L 106 93(L) 97(L)  CO2 22 - 32 mmol/L 24 26 24   Calcium 8.9 - 10.3 mg/dL 9.3 10.0 9.0  Total Protein 6.5 - 8.1 g/dL 7.6 7.8 7.2  Total Bilirubin 0.3 - 1.2 mg/dL 0.4 0.7 0.7  Alkaline Phos 38 - 126 U/L 107 120 100  AST 15 - 41 U/L 27 22 20   ALT 0 - 44 U/L 15  15 12    RADIOGRAPHIC STUDIES: I have personally reviewed the radiological images as listed and agreed with the findings in the report: Borderline resectable pancreatic mass with no evidence of metastatic disease. DG Abd 1 View  Result Date: 05/07/2021 CLINICAL DATA:  Left lower quadrant abdominal pain EXAM: ABDOMEN - 1 VIEW COMPARISON:  CT abdomen/pelvis 03/28/2021 FINDINGS: There is a nonobstructive bowel gas pattern. There is no gross organomegaly or abnormal soft tissue calcification. A biliary stent is noted. There is associated pneumobilia projecting over the liver. There is no acute osseous abnormality. IMPRESSION: No acute abnormality  identified. Electronically Signed   By: Valetta Mole M.D.   On: 05/07/2021 14:38   DG Abd 2 Views  Result Date: 05/12/2021 CLINICAL DATA:  Abdominal pain. EXAM: ABDOMEN - 2 VIEW COMPARISON:  Abdominal x-ray 05/08/2021. CT abdomen and pelvis 05/04/2021. FINDINGS: The stomach appears markedly dilated with air-fluid level. There are no dilated small bowel loops or colon. Colonic air is seen to the level the rectum. Stent is again noted in the right abdomen. There is a phlebolith in the left hemipelvis. Vascular calcifications are again noted in the left pelvis. There is no free intraperitoneal air under the diaphragm. Branching air in the right upper quadrant corresponds to pneumobilia seen on recent CT. No acute fractures are seen. There are degenerative changes of the spine and hips. IMPRESSION: 1. The stomach is dilated with large air-fluid level. This appears similar to the prior study. 2. Small bowel and colon are nondilated. Electronically Signed   By: Ronney Asters M.D.   On: 05/12/2021 16:15   DG Abd 2 Views  Result Date: 05/08/2021 CLINICAL DATA:  Abdominal distention. EXAM: ABDOMEN - 2 VIEW COMPARISON:  Radiograph earlier today.  CT 05/04/2021 FINDINGS: No evidence of free intra-abdominal air. Right upper quadrant biliary stent with resultant pneumobilia is unchanged in positioning from prior exam. Soft tissue fullness in the left upper quadrant corresponds to gastric distention on prior CT. There is no small bowel dilatation. Minimal formed stool in the colon. IMPRESSION: 1. Soft tissue fullness in the left upper abdomen corresponds to fluid distended stomach on prior CT. No small bowel dilatation. 2. Right upper quadrant biliary stent with resultant pneumobilia is unchanged in positioning from prior exam. Electronically Signed   By: Keith Rake M.D.   On: 05/08/2021 12:49    ASSESSMENT & PLAN [name] [name] 60 y.o. male with medical history significant for borderline resectable pancreatic  cancer presents for a follow up visit.   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  The patient does still have an elevation in bilirubin which likely represents a decline from the time of the latest stent placement.  He was evaluated by surgery who determined this is a borderline resectable tumor.  We will plan to start the patient on neoadjuvant chemotherapy.  The 2 options would be Gem/Abraxane or FOLFIRINOX.  Given his excellent functional status I do believe he would be a good candidate for FOLFIRINOX.   # Adenocarcinoma of the Pancreas, Stage IIB -- At this time disease appears borderline resectable.  We will proceed with neoadjuvant chemotherapy.  At this time would prefer a regimen of FOLFIRINOX given his good baseline health. -- case reviewed by surgery, who agrees his disease is borderline resectable.  -- baseline elevations in both CA 19-9 and CEA. Continue to monitor during treatment.  --  Staging scans complete.  No need for further imaging at this time. Will plan for repeat imaging in approximately 8 cycles to reassess --Reassuringly during his admission for intractable nausea and vomiting a CT scan of the abdomen showed marked response in the tumor to the chemotherapy treatment. -- Cycle 1 Day 1 of FOLFIRINOX started 04/10/2021 Plan:  --proceed with treatment tomorrow at [place] -- RTC in 2 weeks for Cycle 5 of treatment  #Gastroparesis #Nausea/Vomiting/Poor PO Intake --patient advanced diet to solid foods.  --nausea meds as below. --continue to work on glycemic control --continue to monitor.    # Pain Control -- Patient currently taking ibuprofen for his abdominal pain --continue oxycodone 5 to 10 mg every 6 hours as needed   #Supportive Care -- chemotherapy education complete -- port placed -- zofran 8mg  q8H PRN and compazine 10mg  PO q6H for  nausea --trial of Creon called into pharmacy to help with loose stools. -- EMLA cream for port  No orders of the defined types were placed in this encounter.  All questions were answered. The patient knows to call the clinic with any problems, questions or concerns.  A total of more than 30 minutes were spent on this encounter with face-to-face time and non-face-to-face time, including preparing to see the patient, ordering tests and/or medications, counseling the patient and coordination of care as outlined above.   Ledell Peoples, MD Department of Hematology/Oncology [place] [facility] Phone: [phone] Pager: [phone] Email: [email or web address]  06/04/2021 11:20 AM

## 2021-06-05 ENCOUNTER — Inpatient Hospital Stay: Payer: Medicaid Other

## 2021-06-05 ENCOUNTER — Other Ambulatory Visit: Payer: Self-pay

## 2021-06-05 ENCOUNTER — Encounter: Payer: Self-pay | Admitting: Hematology and Oncology

## 2021-06-05 ENCOUNTER — Ambulatory Visit (INDEPENDENT_AMBULATORY_CARE_PROVIDER_SITE_OTHER): Payer: Medicaid Other | Admitting: Endocrinology

## 2021-06-05 VITALS — BP 145/88 | HR 76 | Temp 98.6°F | Resp 20

## 2021-06-05 VITALS — BP 140/78 | HR 97 | Ht 69.0 in | Wt 144.2 lb

## 2021-06-05 DIAGNOSIS — E1140 Type 2 diabetes mellitus with diabetic neuropathy, unspecified: Secondary | ICD-10-CM

## 2021-06-05 DIAGNOSIS — Z794 Long term (current) use of insulin: Secondary | ICD-10-CM

## 2021-06-05 DIAGNOSIS — C250 Malignant neoplasm of head of pancreas: Secondary | ICD-10-CM

## 2021-06-05 DIAGNOSIS — Z5111 Encounter for antineoplastic chemotherapy: Secondary | ICD-10-CM | POA: Diagnosis not present

## 2021-06-05 MED ORDER — SODIUM CHLORIDE 0.9 % IV SOLN
10.0000 mg | Freq: Once | INTRAVENOUS | Status: AC
  Administered 2021-06-05: 10 mg via INTRAVENOUS
  Filled 2021-06-05: qty 1

## 2021-06-05 MED ORDER — SODIUM CHLORIDE 0.9 % IV SOLN
400.0000 mg/m2 | Freq: Once | INTRAVENOUS | Status: AC
  Administered 2021-06-05: 748 mg via INTRAVENOUS
  Filled 2021-06-05: qty 37.4

## 2021-06-05 MED ORDER — ATROPINE SULFATE 1 MG/ML IJ SOLN
0.5000 mg | Freq: Once | INTRAMUSCULAR | Status: AC | PRN
  Administered 2021-06-05: 0.5 mg via INTRAVENOUS
  Filled 2021-06-05: qty 1

## 2021-06-05 MED ORDER — SODIUM CHLORIDE 0.9 % IV SOLN
150.0000 mg | Freq: Once | INTRAVENOUS | Status: AC
  Administered 2021-06-05: 150 mg via INTRAVENOUS
  Filled 2021-06-05: qty 5

## 2021-06-05 MED ORDER — SODIUM CHLORIDE 0.9 % IV SOLN
150.0000 mg/m2 | Freq: Once | INTRAVENOUS | Status: AC
  Administered 2021-06-05: 280 mg via INTRAVENOUS
  Filled 2021-06-05: qty 14

## 2021-06-05 MED ORDER — SODIUM CHLORIDE 0.9 % IV SOLN
2400.0000 mg/m2 | INTRAVENOUS | Status: DC
  Administered 2021-06-05: 4500 mg via INTRAVENOUS
  Filled 2021-06-05: qty 90

## 2021-06-05 MED ORDER — OXALIPLATIN CHEMO INJECTION 100 MG/20ML
85.0000 mg/m2 | Freq: Once | INTRAVENOUS | Status: AC
  Administered 2021-06-05: 160 mg via INTRAVENOUS
  Filled 2021-06-05: qty 32

## 2021-06-05 MED ORDER — FREESTYLE LIBRE 2 SENSOR MISC
1.0000 | 3 refills | Status: DC
  Filled 2021-06-05: qty 2, 28d supply, fill #0
  Filled 2021-06-06: qty 6, fill #0
  Filled 2021-06-06 – 2021-06-11 (×2): qty 2, 28d supply, fill #0
  Filled 2021-06-11: qty 6, 84d supply, fill #0
  Filled 2021-06-18: qty 2, 28d supply, fill #0

## 2021-06-05 MED ORDER — INSULIN LISPRO (1 UNIT DIAL) 100 UNIT/ML (KWIKPEN)
15.0000 [IU] | PEN_INJECTOR | Freq: Three times a day (TID) | SUBCUTANEOUS | 3 refills | Status: DC
  Filled 2021-06-05: qty 39, 86d supply, fill #0

## 2021-06-05 MED ORDER — PALONOSETRON HCL INJECTION 0.25 MG/5ML
0.2500 mg | Freq: Once | INTRAVENOUS | Status: AC
  Administered 2021-06-05: 0.25 mg via INTRAVENOUS
  Filled 2021-06-05: qty 5

## 2021-06-05 MED ORDER — DEXTROSE 5 % IV SOLN: Freq: Once | INTRAVENOUS | Status: AC

## 2021-06-05 NOTE — Progress Notes (Signed)
Subjective:    Patient ID: [name] [name], male    DOB: [birth year], 60 y.o.   MRN: [id number]  HPI pt is referred by Dr Tana Coast, for diabetes.  Pt states DM was dx'ed in 4008; it is complicated by left TMA and GP; he has been on insulin since 2021; pt says his diet and exercise are poor; he has never had pancreatitis, severe hypoglycemia or DKA.  He has stage 2B pancreatic cancer.  He says chemo does not affect cbg. He has lost 50 lbs x 7 mos.  He takes lantus 15 units BID, and PRN Humalog (avg total of approx 30 units per day).  He says cbg varies from 60-600.  It is in general highest in the afternoon, and lowest fasting.   Past Medical History:  Diagnosis Date   Barrett's esophagus dx 2016   Bronchitis    Chronic hepatitis C without hepatic coma ([place]) 10/31/2014   Depression    Diabetes ([place])    ED (erectile dysfunction)    GERD (gastroesophageal reflux disease)    Hepatitis C    Hiatal hernia 10/2014   3cm   Neuropathy    S/P transmetatarsal amputation of foot, left ([place]) 11/28/2020    Past Surgical History:  Procedure Laterality Date   AMPUTATION Left 11/15/2020   Procedure: LEFT TRANSMETATARSALS AMPUTATION;  Surgeon: Newt Minion, MD;  Location: [facility];  Service: Orthopedics;  Laterality: Left;   APPENDECTOMY     BILIARY STENT PLACEMENT N/A 02/27/2021   Procedure: BILIARY STENT PLACEMENT;  Surgeon: Milus Banister, MD;  Location: [facility];  Service: Endoscopy;  Laterality: N/A;   BILIARY STENT PLACEMENT N/A 03/27/2021   Procedure: BILIARY STENT PLACEMENT;  Surgeon: Jackquline Denmark, MD;  Location: [facility];  Service: Endoscopy;  Laterality: N/A;   BIOPSY  03/27/2021   Procedure: BIOPSY;  Surgeon: Jackquline Denmark, MD;  Location: [facility];  Service: Endoscopy;;   COLONOSCOPY N/A 02/16/2014   Procedure: COLONOSCOPY;  Surgeon: Gatha Mayer, MD;  Location: [facility];  Service: Endoscopy;  Laterality: N/A;   COLONOSCOPY     ENDOSCOPIC RETROGRADE CHOLANGIOPANCREATOGRAPHY  (ERCP) WITH PROPOFOL N/A 02/27/2021   Procedure: ENDOSCOPIC RETROGRADE CHOLANGIOPANCREATOGRAPHY (ERCP) WITH PROPOFOL;  Surgeon: Milus Banister, MD;  Location: [facility];  Service: Endoscopy;  Laterality: N/A;   ERCP N/A 03/27/2021   Procedure: ENDOSCOPIC RETROGRADE CHOLANGIOPANCREATOGRAPHY (ERCP);  Surgeon: Jackquline Denmark, MD;  Location: [facility];  Service: Endoscopy;  Laterality: N/A;   ESOPHAGOGASTRODUODENOSCOPY (EGD) WITH PROPOFOL N/A 05/06/2021   Procedure: ESOPHAGOGASTRODUODENOSCOPY (EGD) WITH PROPOFOL;  Surgeon: Mauri Pole, MD;  Location: [facility];  Service: Endoscopy;  Laterality: N/A;   EUS N/A 02/27/2021   Procedure: UPPER ENDOSCOPIC ULTRASOUND (EUS) RADIAL;  Surgeon: Milus Banister, MD;  Location: [facility];  Service: Endoscopy;  Laterality: N/A;   FINE NEEDLE ASPIRATION N/A 02/27/2021   Procedure: FINE NEEDLE ASPIRATION (FNA) LINEAR;  Surgeon: Milus Banister, MD;  Location: [facility];  Service: Endoscopy;  Laterality: N/A;   IR IMAGING GUIDED PORT INSERTION  03/21/2021   SPHINCTEROTOMY  02/27/2021   Procedure: SPHINCTEROTOMY;  Surgeon: Milus Banister, MD;  Location: [facility];  Service: Endoscopy;;   STENT REMOVAL  03/27/2021   Procedure: STENT REMOVAL;  Surgeon: Jackquline Denmark, MD;  Location: [facility];  Service: Endoscopy;;   TONSILLECTOMY      Social History   Socioeconomic History   Marital status: Single    Spouse name: Not on file   Number of children: Not  on file   Years of education: Not on file   Highest education level: Not on file  Occupational History   Not on file  Tobacco Use   Smoking status: Every Day    Packs/day: 0.50    Years: 35.00    Pack years: 17.50    Types: Cigarettes   Smokeless tobacco: Never  Vaping Use   Vaping Use: Never used  Substance and Sexual Activity   Alcohol use: Not Currently    Comment: previous   Drug use: No   Sexual activity: Not on file  Other Topics Concern   Not on file  Social History  Narrative   Not on file   Social Determinants of Health   Financial Resource Strain: High Risk   Difficulty of Paying Living Expenses: Very hard  Food Insecurity: Food Insecurity Present   Worried About Charity fundraiser in the Last Year: Sometimes true   Arboriculturist in the Last Year: Sometimes true  Transportation Needs: Unmet Transportation Needs   Lack of Transportation (Medical): Yes   Lack of Transportation (Non-Medical): Yes  Physical Activity: Not on file  Stress: Not on file  Social Connections: Not on file  Intimate Partner Violence: Not on file    Current Outpatient Medications on File Prior to Visit  Medication Sig Dispense Refill   albuterol (VENTOLIN HFA) 108 (90 Base) MCG/ACT inhaler Inhale 2 puffs into the lungs every 6 (six) hours as needed for wheezing or shortness of breath. 18 g 0   atorvastatin (LIPITOR) 20 MG tablet Take 1 tablet (20 mg total) by mouth daily. 90 tablet 0   EPINEPHrine 0.3 mg/0.3 mL IJ SOAJ injection Inject 0.3 mg into the muscle once as needed for anaphylaxis.     folic acid (FOLVITE) 1 MG tablet Take 1 tablet (1 mg total) by mouth in the morning.     glucose blood (TRUE METRIX BLOOD GLUCOSE TEST) test strip Use as instructed. Check blood glucose level by fingerstick 3-4 times per day.  E11.65 200 each 12   insulin glargine (LANTUS SOLOSTAR) 100 UNIT/ML Solostar Pen Inject 10 Units into the skin 2 (two) times daily. 15 mL 0   Insulin Pen Needle (TECHLITE PEN NEEDLES) 32G X 4 MM MISC USE AS INSTRUCTED INTO THE SKIN TWICE DAILY 100 each 11   Insulin Pen Needle (TRUEPLUS PEN NEEDLES) 32G X 4 MM MISC Use as instructed. Inject into the skin twice daily 200 each 6   lidocaine-prilocaine (EMLA) cream Apply 1 application topically as directed as needed. (Patient taking differently: Apply 1 application topically once as needed (prior to port access).) 30 g 0   lipase/protease/amylase (CREON) 36000 UNITS CPEP capsule Take 2 capsules (72,000 Units total)  by mouth 3 (three) times daily with meals. May also take 1 capsule (36,000 Units total) as needed (with snacks). 210 capsule 3   losartan (COZAAR) 50 MG tablet Take 1 tablet (50 mg total) by mouth daily. 90 tablet 1   metoCLOPramide (REGLAN) 5 MG tablet Take 1 tablet (5 mg total) by mouth 4 (four) times daily -  before meals and at bedtime. 90 tablet 1   mirtazapine (REMERON) 30 MG tablet Take 1 tablet (30 mg total) by mouth at bedtime. 90 tablet 1   Multiple Vitamins-Minerals (THEREMS-M) TABS Take 1 tablet by mouth in the morning.     omeprazole (PRILOSEC) 40 MG capsule Take 1 capsule (40 mg total) by mouth in the morning.     ondansetron (  ZOFRAN) 8 MG tablet Take 1 tablet (8 mg total) by mouth 2 (two) times daily as needed. Start on day 3 after chemotherapy. (Patient taking differently: Take 8 mg by mouth 2 (two) times daily as needed for nausea or vomiting. Start on day 3 after chemotherapy.) 30 tablet 1   oxyCODONE (OXY IR/ROXICODONE) 5 MG immediate release tablet Take 1 tablet (5 mg total) by mouth every 6 (six) hours as needed for severe pain. 60 tablet 0   polyethylene glycol powder (GLYCOLAX/MIRALAX) 17 GM/SCOOP powder Take 17 g by mouth at bedtime. 238 g 0   prochlorperazine (COMPAZINE) 10 MG tablet Take 1 tablet (10 mg total) by mouth every 6 (six) hours as needed (Nausea or vomiting). 30 tablet 1   TRUEplus Lancets 28G MISC Use as instructed. Check blood glucose level by fingerstick 3-4 times per day.   E11.65 200 each 3   No current facility-administered medications on file prior to visit.    Allergies  Allergen Reactions   Bee Venom Anaphylaxis   Lactose Intolerance (Gi) Other (See Comments)    GI upset    Family History  Problem Relation Age of Onset   Breast cancer Mother    Stroke Father    Alcohol abuse Father    Heart disease Maternal Grandfather    Pancreatic cancer Paternal Grandmother    Diabetes Paternal Grandfather    Colon cancer Neg Hx    Stomach cancer Neg Hx     Rectal cancer Neg Hx    Esophageal cancer Neg Hx     BP 140/78 (BP Location: Right Arm, Patient Position: Sitting, Cuff Size: Normal)   Pulse 97   Ht 5\' 9"  (1.753 m)   Wt 144 lb 3.2 oz (65.4 kg)   SpO2 99%   BMI 21.29 kg/m     Review of Systems denies n/v, memory loss.      Objective:   Physical Exam Pulses: dorsalis pedis intact bilat.   MSK: no deformity of the feet, except left TMA CV: no leg edema.   Skin:  no ulcer on the feet.  normal color and temp on the feet. Neuro: sensation is intact to touch on the feet.    Lab Results  Component Value Date   CREATININE 0.62 06/04/2021   BUN 7 06/04/2021   NA 141 06/04/2021   K 3.3 (L) 06/04/2021   CL 106 06/04/2021   CO2 24 06/04/2021   Lab Results  Component Value Date   HGBA1C 9.6 (H) 04/28/2021   I have reviewed outside records, and summarized: Pt was noted to have elevated A1c, and referred here.  He gets chremotx, but no steroid rx.  Panc surg is planned later     Assessment & Plan:  Insulin-requiring type 2 DM: uncontrolled. Lean body habitus and minimal FHx mean he is at risk for evolving type 1  Patient Instructions  good diet and exercise significantly improve the control of your diabetes.  please let me know if you wish to be referred to a dietician.  high blood sugar is very risky to your health.  you should see an eye doctor and dentist every year.  It is very important to get all recommended vaccinations.  Controlling your blood pressure and cholesterol drastically reduces the damage diabetes does to your body.  Those who smoke should quit.  Please discuss these with your doctor.  check your blood sugar twice a day.  vary the time of day when you check, between before the  3 meals, and at bedtime.  also check if you have symptoms of your blood sugar being too high or too low.  please keep a record of the readings and bring it to your next appointment here (or you can bring the meter itself).  You can write  it on any piece of paper.  please call us sooner if your blood sugar goes below 70, or if most of your readings are over 200.   Please takes the insulin numbers listed below, no matter what your blood sugar is.   I have sent a prescription to your pharmacy, for continuous glucose monitor sensors.  Please call if you need help with this.  It is important to carefully follow your blood sugar, as it could go low.  Please come back for a follow-up appointment in 1 month.

## 2021-06-05 NOTE — Progress Notes (Signed)
Patient presents for treatment. RN assessment completed along with the following:  Labs/vitals reviewed - Yes, and within treatment parameters.   Weight within 10% of previous measurement - Yes Oncology Treatment Attestation completed for current therapy- Yes, on date 03/17/2021 Informed consent completed and reflects current therapy/intent - Yes, on date 04/24/2021             Provider progress note reviewed - Patient not seen by provider today. Most recent note dated 06/04/2021 reviewed. Treatment/Antibody/Supportive plan reviewed - Yes, and there are no adjustments needed for today's treatment. S&H and other orders reviewed - Yes, and there are no additional orders identified. Previous treatment date reviewed - Yes, and the appropriate amount of time has elapsed between treatments. Clinic Hand Off Received from - No.  Patient to proceed with treatment.

## 2021-06-05 NOTE — Patient Instructions (Addendum)
good diet and exercise significantly improve the control of your diabetes.  please let me know if you wish to be referred to a dietician.  high blood sugar is very risky to your health.  you should see an eye doctor and dentist every year.  It is very important to get all recommended vaccinations.  Controlling your blood pressure and cholesterol drastically reduces the damage diabetes does to your body.  Those who smoke should quit.  Please discuss these with your doctor.  check your blood sugar twice a day.  vary the time of day when you check, between before the 3 meals, and at bedtime.  also check if you have symptoms of your blood sugar being too high or too low.  please keep a record of the readings and bring it to your next appointment here (or you can bring the meter itself).  You can write it on any piece of paper.  please call us sooner if your blood sugar goes below 70, or if most of your readings are over 200.   Please takes the insulin numbers listed below, no matter what your blood sugar is.   I have sent a prescription to your pharmacy, for continuous glucose monitor sensors.  Please call if you need help with this.  It is important to carefully follow your blood sugar, as it could go low.  Please come back for a follow-up appointment in 1 month.

## 2021-06-05 NOTE — Patient Instructions (Signed)
[facility]   Discharge Instructions: Thank you for choosing [facility] to provide your oncology and hematology care.   If you have a lab appointment with the [facility], please go directly to [facility] and check in at the registration area.   Wear comfortable clothing and clothing appropriate for easy access to any Portacath or PICC line.   We strive to give you quality time with your provider. You may need to reschedule your appointment if you arrive late (15 or more minutes).  Arriving late affects you and other patients whose appointments are after yours.  Also, if you miss three or more appointments without notifying the office, you may be dismissed from the clinic at the provider's discretion.      For prescription refill requests, have your pharmacy contact our office and allow 72 hours for refills to be completed.    Today you received the following chemotherapy and/or immunotherapy agents Oxaliplatin (ELOXATIN), Irinotecan (CAMPTOSAR), Leucovorin & Flourouracil (ADRUCIL).      To help prevent nausea and vomiting after your treatment, we encourage you to take your nausea medication as directed.  BELOW ARE SYMPTOMS THAT SHOULD BE REPORTED IMMEDIATELY: *FEVER GREATER THAN 100.4 F (38 C) OR HIGHER *CHILLS OR SWEATING *NAUSEA AND VOMITING THAT IS NOT CONTROLLED WITH YOUR NAUSEA MEDICATION *UNUSUAL SHORTNESS OF BREATH *UNUSUAL BRUISING OR BLEEDING *URINARY PROBLEMS (pain or burning when urinating, or frequent urination) *BOWEL PROBLEMS (unusual diarrhea, constipation, pain near the anus) TENDERNESS IN MOUTH AND THROAT WITH OR WITHOUT PRESENCE OF ULCERS (sore throat, sores in mouth, or a toothache) UNUSUAL RASH, SWELLING OR PAIN  UNUSUAL VAGINAL DISCHARGE OR ITCHING   Items with * indicate a potential emergency and should be followed up as soon as possible or go to the Emergency Department if any problems should occur.  Please  show the CHEMOTHERAPY ALERT CARD or IMMUNOTHERAPY ALERT CARD at check-in to the Emergency Department and triage nurse.  Should you have questions after your visit or need to cancel or reschedule your appointment, please contact [facility]  Dept: [phone]  and follow the prompts.  Office hours are 8:00 a.m. to 4:30 p.m. Monday - Friday. Please note that voicemails left after 4:00 p.m. may not be returned until the following business day.  We are closed weekends and major holidays. You have access to a nurse at all times for urgent questions. Please call the main number to the clinic Dept: [phone] and follow the prompts.   For any non-urgent questions, you may also contact your provider using MyChart. We now offer e-Visits for anyone 9 and older to request care online for non-urgent symptoms. For details visit [email or web address].   Also download the MyChart app! Go to the app store, search "MyChart", open the app, select , and log in with your MyChart username and password.  Due to Covid, a mask is required upon entering the hospital/clinic. If you do not have a mask, one will be given to you upon arrival. For doctor visits, patients may have 1 support person aged 79 or older with them. For treatment visits, patients cannot have anyone with them due to current Covid guidelines and our immunocompromised population.   Oxaliplatin Injection What is this medication? OXALIPLATIN (ox AL i PLA tin) is a chemotherapy drug. It targets fast dividing cells, like cancer cells, and causes these cells to die. This medicine is used to treat cancers of the colon and  rectum, and many other cancers. This medicine may be used for other purposes; ask your health care provider or pharmacist if you have questions. COMMON BRAND NAME(S): Eloxatin What should I tell my care team before I take this medication? They need to know if you have any of these conditions: heart  disease history of irregular heartbeat liver disease low blood counts, like white cells, platelets, or red blood cells lung or breathing disease, like asthma take medicines that treat or prevent blood clots tingling of the fingers or toes, or other nerve disorder an unusual or allergic reaction to oxaliplatin, other chemotherapy, other medicines, foods, dyes, or preservatives pregnant or trying to get pregnant breast-feeding How should I use this medication? This drug is given as an infusion into a vein. It is administered in a hospital or clinic by a specially trained health care professional. Talk to your pediatrician regarding the use of this medicine in children. Special care may be needed. Overdosage: If you think you have taken too much of this medicine contact a poison control center or emergency room at once. NOTE: This medicine is only for you. Do not share this medicine with others. What if I miss a dose? It is important not to miss a dose. Call your doctor or health care professional if you are unable to keep an appointment. What may interact with this medication? Do not take this medicine with any of the following medications: cisapride dronedarone pimozide thioridazine This medicine may also interact with the following medications: aspirin and aspirin-like medicines certain medicines that treat or prevent blood clots like warfarin, apixaban, dabigatran, and rivaroxaban cisplatin cyclosporine diuretics medicines for infection like acyclovir, adefovir, amphotericin B, bacitracin, cidofovir, foscarnet, ganciclovir, gentamicin, pentamidine, vancomycin NSAIDs, medicines for pain and inflammation, like ibuprofen or naproxen other medicines that prolong the QT interval (an abnormal heart rhythm) pamidronate zoledronic acid This list may not describe all possible interactions. Give your health care provider a list of all the medicines, herbs, non-prescription drugs, or dietary  supplements you use. Also tell them if you smoke, drink alcohol, or use illegal drugs. Some items may interact with your medicine. What should I watch for while using this medication? Your condition will be monitored carefully while you are receiving this medicine. You may need blood work done while you are taking this medicine. This medicine may make you feel generally unwell. This is not uncommon as chemotherapy can affect healthy cells as well as cancer cells. Report any side effects. Continue your course of treatment even though you feel ill unless your healthcare professional tells you to stop. This medicine can make you more sensitive to cold. Do not drink cold drinks or use ice. Cover exposed skin before coming in contact with cold temperatures or cold objects. When out in cold weather wear warm clothing and cover your mouth and nose to warm the air that goes into your lungs. Tell your doctor if you get sensitive to the cold. Do not become pregnant while taking this medicine or for 9 months after stopping it. Women should inform their health care professional if they wish to become pregnant or think they might be pregnant. Men should not father a child while taking this medicine and for 6 months after stopping it. There is potential for serious side effects to an unborn child. Talk to your health care professional for more information. Do not breast-feed a child while taking this medicine or for 3 months after stopping it. This medicine has caused ovarian  failure in some women. This medicine may make it more difficult to get pregnant. Talk to your health care professional if you are concerned about your fertility. This medicine has caused decreased sperm counts in some men. This may make it more difficult to father a child. Talk to your health care professional if you are concerned about your fertility. This medicine may increase your risk of getting an infection. Call your health care professional  for advice if you get a fever, chills, or sore throat, or other symptoms of a cold or flu. Do not treat yourself. Try to avoid being around people who are sick. Avoid taking medicines that contain aspirin, acetaminophen, ibuprofen, naproxen, or ketoprofen unless instructed by your health care professional. These medicines may hide a fever. Be careful brushing or flossing your teeth or using a toothpick because you may get an infection or bleed more easily. If you have any dental work done, tell your dentist you are receiving this medicine. What side effects may I notice from receiving this medication? Side effects that you should report to your doctor or health care professional as soon as possible: allergic reactions like skin rash, itching or hives, swelling of the face, lips, or tongue breathing problems cough low blood counts - this medicine may decrease the number of white blood cells, red blood cells, and platelets. You may be at increased risk for infections and bleeding nausea, vomiting pain, redness, or irritation at site where injected pain, tingling, numbness in the hands or feet signs and symptoms of bleeding such as bloody or black, tarry stools; red or dark brown urine; spitting up blood or brown material that looks like coffee grounds; red spots on the skin; unusual bruising or bleeding from the eyes, gums, or nose signs and symptoms of a dangerous change in heartbeat or heart rhythm like chest pain; dizziness; fast, irregular heartbeat; palpitations; feeling faint or lightheaded; falls signs and symptoms of infection like fever; chills; cough; sore throat; pain or trouble passing urine signs and symptoms of liver injury like dark yellow or brown urine; general ill feeling or flu-like symptoms; light-colored stools; loss of appetite; nausea; right upper belly pain; unusually weak or tired; yellowing of the eyes or skin signs and symptoms of low red blood cells or anemia such as  unusually weak or tired; feeling faint or lightheaded; falls signs and symptoms of muscle injury like dark urine; trouble passing urine or change in the amount of urine; unusually weak or tired; muscle pain; back pain Side effects that usually do not require medical attention (report to your doctor or health care professional if they continue or are bothersome): changes in taste diarrhea gas hair loss loss of appetite mouth sores This list may not describe all possible side effects. Call your doctor for medical advice about side effects. You may report side effects to FDA at 1-800-FDA-1088. Where should I keep my medication? This drug is given in a hospital or clinic and will not be stored at home. NOTE: This sheet is a summary. It may not cover all possible information. If you have questions about this medicine, talk to your doctor, pharmacist, or health care provider.  2022 Elsevier/Gold Standard (2019-01-04 12:20:35)  Irinotecan injection What is this medication? IRINOTECAN (ir in oh TEE kan ) is a chemotherapy drug. It is used to treat colon and rectal cancer. This medicine may be used for other purposes; ask your health care provider or pharmacist if you have questions. COMMON BRAND NAME(S): Camptosar  What should I tell my care team before I take this medication? They need to know if you have any of these conditions: dehydration diarrhea infection (especially a virus infection such as chickenpox, cold sores, or herpes) liver disease low blood counts, like low white cell, platelet, or red cell counts low levels of calcium, magnesium, or potassium in the blood recent or ongoing radiation therapy an unusual or allergic reaction to irinotecan, other medicines, foods, dyes, or preservatives pregnant or trying to get pregnant breast-feeding How should I use this medication? This drug is given as an infusion into a vein. It is administered in a hospital or clinic by a specially  trained health care professional. Talk to your pediatrician regarding the use of this medicine in children. Special care may be needed. Overdosage: If you think you have taken too much of this medicine contact a poison control center or emergency room at once. NOTE: This medicine is only for you. Do not share this medicine with others. What if I miss a dose? It is important not to miss your dose. Call your doctor or health care professional if you are unable to keep an appointment. What may interact with this medication? Do not take this medicine with any of the following medications: cobicistat itraconazole This medicine may interact with the following medications: antiviral medicines for HIV or AIDS certain antibiotics like rifampin or rifabutin certain medicines for fungal infections like ketoconazole, posaconazole, and voriconazole certain medicines for seizures like carbamazepine, phenobarbital, phenotoin clarithromycin gemfibrozil nefazodone [facility]. John's Wort This list may not describe all possible interactions. Give your health care provider a list of all the medicines, herbs, non-prescription drugs, or dietary supplements you use. Also tell them if you smoke, drink alcohol, or use illegal drugs. Some items may interact with your medicine. What should I watch for while using this medication? Your condition will be monitored carefully while you are receiving this medicine. You will need important blood work done while you are taking this medicine. This drug may make you feel generally unwell. This is not uncommon, as chemotherapy can affect healthy cells as well as cancer cells. Report any side effects. Continue your course of treatment even though you feel ill unless your doctor tells you to stop. In some cases, you may be given additional medicines to help with side effects. Follow all directions for their use. You may get drowsy or dizzy. Do not drive, use machinery, or do anything  that needs mental alertness until you know how this medicine affects you. Do not stand or sit up quickly, especially if you are an older patient. This reduces the risk of dizzy or fainting spells. Call your health care professional for advice if you get a fever, chills, or sore throat, or other symptoms of a cold or flu. Do not treat yourself. This medicine decreases your body's ability to fight infections. Try to avoid being around people who are sick. Avoid taking products that contain aspirin, acetaminophen, ibuprofen, naproxen, or ketoprofen unless instructed by your doctor. These medicines may hide a fever. This medicine may increase your risk to bruise or bleed. Call your doctor or health care professional if you notice any unusual bleeding. Be careful brushing and flossing your teeth or using a toothpick because you may get an infection or bleed more easily. If you have any dental work done, tell your dentist you are receiving this medicine. Do not become pregnant while taking this medicine or for 6 months after stopping it. Women  should inform their health care professional if they wish to become pregnant or think they might be pregnant. Men should not father a child while taking this medicine and for 3 months after stopping it. There is potential for serious side effects to an unborn child. Talk to your health care professional for more information. Do not breast-feed an infant while taking this medicine or for 7 days after stopping it. This medicine has caused ovarian failure in some women. This medicine may make it more difficult to get pregnant. Talk to your health care professional if you are concerned about your fertility. This medicine has caused decreased sperm counts in some men. This may make it more difficult to father a child. Talk to your health care professional if you are concerned about your fertility. What side effects may I notice from receiving this medication? Side effects that  you should report to your doctor or health care professional as soon as possible: allergic reactions like skin rash, itching or hives, swelling of the face, lips, or tongue chest pain diarrhea flushing, runny nose, sweating during infusion low blood counts - this medicine may decrease the number of white blood cells, red blood cells and platelets. You may be at increased risk for infections and bleeding. nausea, vomiting pain, swelling, warmth in the leg signs of decreased platelets or bleeding - bruising, pinpoint red spots on the skin, black, tarry stools, blood in the urine signs of infection - fever or chills, cough, sore throat, pain or difficulty passing urine signs of decreased red blood cells - unusually weak or tired, fainting spells, lightheadedness Side effects that usually do not require medical attention (report to your doctor or health care professional if they continue or are bothersome): constipation hair loss headache loss of appetite mouth sores stomach pain This list may not describe all possible side effects. Call your doctor for medical advice about side effects. You may report side effects to FDA at 1-800-FDA-1088. Where should I keep my medication? This drug is given in a hospital or clinic and will not be stored at home. NOTE: This sheet is a summary. It may not cover all possible information. If you have questions about this medicine, talk to your doctor, pharmacist, or health care provider.  2022 Elsevier/Gold Standard (2019-07-18 17:46:13)  Leucovorin injection What is this medication? LEUCOVORIN (loo koe VOR in) is used to prevent or treat the harmful effects of some medicines. This medicine is used to treat anemia caused by a low amount of folic acid in the body. It is also used with 5-fluorouracil (5-FU) to treat colon cancer. This medicine may be used for other purposes; ask your health care provider or pharmacist if you have questions. What should I tell  my care team before I take this medication? They need to know if you have any of these conditions: anemia from low levels of vitamin B-12 in the blood an unusual or allergic reaction to leucovorin, folic acid, other medicines, foods, dyes, or preservatives pregnant or trying to get pregnant breast-feeding How should I use this medication? This medicine is for injection into a muscle or into a vein. It is given by a health care professional in a hospital or clinic setting. Talk to your pediatrician regarding the use of this medicine in children. Special care may be needed. Overdosage: If you think you have taken too much of this medicine contact a poison control center or emergency room at once. NOTE: This medicine is only for you. Do  not share this medicine with others. What if I miss a dose? This does not apply. What may interact with this medication? capecitabine fluorouracil phenobarbital phenytoin primidone trimethoprim-sulfamethoxazole This list may not describe all possible interactions. Give your health care provider a list of all the medicines, herbs, non-prescription drugs, or dietary supplements you use. Also tell them if you smoke, drink alcohol, or use illegal drugs. Some items may interact with your medicine. What should I watch for while using this medication? Your condition will be monitored carefully while you are receiving this medicine. This medicine may increase the side effects of 5-fluorouracil, 5-FU. Tell your doctor or health care professional if you have diarrhea or mouth sores that do not get better or that get worse. What side effects may I notice from receiving this medication? Side effects that you should report to your doctor or health care professional as soon as possible: allergic reactions like skin rash, itching or hives, swelling of the face, lips, or tongue breathing problems fever, infection mouth sores unusual bleeding or bruising unusually weak or  tired Side effects that usually do not require medical attention (report to your doctor or health care professional if they continue or are bothersome): constipation or diarrhea loss of appetite nausea, vomiting This list may not describe all possible side effects. Call your doctor for medical advice about side effects. You may report side effects to FDA at 1-800-FDA-1088. Where should I keep my medication? This drug is given in a hospital or clinic and will not be stored at home. NOTE: This sheet is a summary. It may not cover all possible information. If you have questions about this medicine, talk to your doctor, pharmacist, or health care provider.  2022 Elsevier/Gold Standard (2008-02-21 16:50:29)  Fluorouracil, 5-FU injection What is this medication? FLUOROURACIL, 5-FU (flure oh YOOR a sil) is a chemotherapy drug. It slows the growth of cancer cells. This medicine is used to treat many types of cancer like breast cancer, colon or rectal cancer, pancreatic cancer, and stomach cancer. This medicine may be used for other purposes; ask your health care provider or pharmacist if you have questions. COMMON BRAND NAME(S): Adrucil What should I tell my care team before I take this medication? They need to know if you have any of these conditions: blood disorders dihydropyrimidine dehydrogenase (DPD) deficiency infection (especially a virus infection such as chickenpox, cold sores, or herpes) kidney disease liver disease malnourished, poor nutrition recent or ongoing radiation therapy an unusual or allergic reaction to fluorouracil, other chemotherapy, other medicines, foods, dyes, or preservatives pregnant or trying to get pregnant breast-feeding How should I use this medication? This drug is given as an infusion or injection into a vein. It is administered in a hospital or clinic by a specially trained health care professional. Talk to your pediatrician regarding the use of this  medicine in children. Special care may be needed. Overdosage: If you think you have taken too much of this medicine contact a poison control center or emergency room at once. NOTE: This medicine is only for you. Do not share this medicine with others. What if I miss a dose? It is important not to miss your dose. Call your doctor or health care professional if you are unable to keep an appointment. What may interact with this medication? Do not take this medicine with any of the following medications: live virus vaccines This medicine may also interact with the following medications: medicines that treat or prevent blood clots like warfarin,  enoxaparin, and dalteparin This list may not describe all possible interactions. Give your health care provider a list of all the medicines, herbs, non-prescription drugs, or dietary supplements you use. Also tell them if you smoke, drink alcohol, or use illegal drugs. Some items may interact with your medicine. What should I watch for while using this medication? Visit your doctor for checks on your progress. This drug may make you feel generally unwell. This is not uncommon, as chemotherapy can affect healthy cells as well as cancer cells. Report any side effects. Continue your course of treatment even though you feel ill unless your doctor tells you to stop. In some cases, you may be given additional medicines to help with side effects. Follow all directions for their use. Call your doctor or health care professional for advice if you get a fever, chills or sore throat, or other symptoms of a cold or flu. Do not treat yourself. This drug decreases your body's ability to fight infections. Try to avoid being around people who are sick. This medicine may increase your risk to bruise or bleed. Call your doctor or health care professional if you notice any unusual bleeding. Be careful brushing and flossing your teeth or using a toothpick because you may get an  infection or bleed more easily. If you have any dental work done, tell your dentist you are receiving this medicine. Avoid taking products that contain aspirin, acetaminophen, ibuprofen, naproxen, or ketoprofen unless instructed by your doctor. These medicines may hide a fever. Do not become pregnant while taking this medicine. Women should inform their doctor if they wish to become pregnant or think they might be pregnant. There is a potential for serious side effects to an unborn child. Talk to your health care professional or pharmacist for more information. Do not breast-feed an infant while taking this medicine. Men should inform their doctor if they wish to father a child. This medicine may lower sperm counts. Do not treat diarrhea with over the counter products. Contact your doctor if you have diarrhea that lasts more than 2 days or if it is severe and watery. This medicine can make you more sensitive to the sun. Keep out of the sun. If you cannot avoid being in the sun, wear protective clothing and use sunscreen. Do not use sun lamps or tanning beds/booths. What side effects may I notice from receiving this medication? Side effects that you should report to your doctor or health care professional as soon as possible: allergic reactions like skin rash, itching or hives, swelling of the face, lips, or tongue low blood counts - this medicine may decrease the number of white blood cells, red blood cells and platelets. You may be at increased risk for infections and bleeding. signs of infection - fever or chills, cough, sore throat, pain or difficulty passing urine signs of decreased platelets or bleeding - bruising, pinpoint red spots on the skin, black, tarry stools, blood in the urine signs of decreased red blood cells - unusually weak or tired, fainting spells, lightheadedness breathing problems changes in vision chest pain mouth sores nausea and vomiting pain, swelling, redness at site  where injected pain, tingling, numbness in the hands or feet redness, swelling, or sores on hands or feet stomach pain unusual bleeding Side effects that usually do not require medical attention (report to your doctor or health care professional if they continue or are bothersome): changes in finger or toe nails diarrhea dry or itchy skin hair loss headache loss  of appetite sensitivity of eyes to the light stomach upset unusually teary eyes This list may not describe all possible side effects. Call your doctor for medical advice about side effects. You may report side effects to FDA at 1-800-FDA-1088. Where should I keep my medication? This drug is given in a hospital or clinic and will not be stored at home. NOTE: This sheet is a summary. It may not cover all possible information. If you have questions about this medicine, talk to your doctor, pharmacist, or health care provider.  2022 Elsevier/Gold Standard (2019-07-18 15:00:03)  The chemotherapy medication bag should finish at 46 hours, 96 hours, or 7 days. For example, if your pump is scheduled for 46 hours and it was put on at 4:00 p.m., it should finish at 2:00 p.m. the day it is scheduled to come off regardless of your appointment time.     Estimated time to finish at 1:30 p.m. on Saturday 06/07/2021.   If the display on your pump reads "Low Volume" and it is beeping, take the batteries out of the pump and come to the cancer center for it to be taken off.   If the pump alarms go off prior to the pump reading "Low Volume" then call [phone] and someone can assist you.  If the plunger comes out and the chemotherapy medication is leaking out, please use your home chemo spill kit to clean up the spill. Do NOT use paper towels or other household products.  If you have problems or questions regarding your pump, please call either [phone] (24 hours a day) or the cancer center Monday-Friday 8:00 a.m.- 4:30 p.m. at the  clinic number and we will assist you. If you are unable to get assistance, then go to the nearest Emergency Department and ask the staff to contact the IV team for assistance.

## 2021-06-06 ENCOUNTER — Other Ambulatory Visit (HOSPITAL_COMMUNITY): Payer: Self-pay

## 2021-06-06 ENCOUNTER — Other Ambulatory Visit: Payer: Self-pay | Admitting: Hematology and Oncology

## 2021-06-06 ENCOUNTER — Encounter: Payer: Self-pay | Admitting: Nurse Practitioner

## 2021-06-06 ENCOUNTER — Other Ambulatory Visit: Payer: Self-pay | Admitting: Nurse Practitioner

## 2021-06-06 ENCOUNTER — Ambulatory Visit: Payer: Medicaid Other | Attending: Nurse Practitioner | Admitting: Nurse Practitioner

## 2021-06-06 ENCOUNTER — Other Ambulatory Visit: Payer: Self-pay

## 2021-06-06 ENCOUNTER — Other Ambulatory Visit: Payer: Self-pay | Admitting: Physician Assistant

## 2021-06-06 ENCOUNTER — Encounter: Payer: Self-pay | Admitting: Hematology and Oncology

## 2021-06-06 DIAGNOSIS — S63259D Unspecified dislocation of unspecified finger, subsequent encounter: Secondary | ICD-10-CM

## 2021-06-06 DIAGNOSIS — E1142 Type 2 diabetes mellitus with diabetic polyneuropathy: Secondary | ICD-10-CM

## 2021-06-06 DIAGNOSIS — R159 Full incontinence of feces: Secondary | ICD-10-CM

## 2021-06-06 DIAGNOSIS — C250 Malignant neoplasm of head of pancreas: Secondary | ICD-10-CM

## 2021-06-06 MED ORDER — MAGIC MOUTHWASH W/LIDOCAINE: 5.0000 mL | Freq: Four times a day (QID) | ORAL | 0 refills | Status: DC | PRN

## 2021-06-06 MED ORDER — DIPHENOXYLATE-ATROPINE 2.5-0.025 MG PO TABS
1.0000 | ORAL_TABLET | Freq: Four times a day (QID) | ORAL | 1 refills | Status: DC | PRN
  Filled 2021-06-06: qty 60, 15d supply, fill #0
  Filled 2021-09-07: qty 60, 15d supply, fill #1

## 2021-06-06 MED ORDER — STERILE WATER FOR INJECTION IV SOLN
OROMUCOSAL | 0 refills | Status: DC
  Filled 2021-06-06: qty 400, 20d supply, fill #0

## 2021-06-06 MED ORDER — DULOXETINE HCL 60 MG PO CPEP
60.0000 mg | ORAL_CAPSULE | Freq: Every day | ORAL | 1 refills | Status: DC
Start: 2021-06-06 — End: 2021-09-10
  Filled 2021-06-06 (×2): qty 90, 90d supply, fill #0
  Filled 2021-07-03: qty 90, 90d supply, fill #1
  Filled 2021-08-11: qty 30, 30d supply, fill #1
  Filled 2021-08-13: qty 90, 90d supply, fill #1

## 2021-06-06 MED ORDER — DIPHENOXYLATE-ATROPINE 2.5-0.025 MG PO TABS
1.0000 | ORAL_TABLET | Freq: Four times a day (QID) | ORAL | 1 refills | Status: DC | PRN
  Filled 2021-06-06: qty 60, 15d supply, fill #0

## 2021-06-06 NOTE — Progress Notes (Signed)
Virtual Visit via Telephone Note Due to national recommendations of social distancing due to [place] 19, telehealth visit is felt to be most appropriate for this patient at this time.  I discussed the limitations, risks, security and privacy concerns of performing an evaluation and management service by telephone and the availability of in person appointments. I also discussed with the patient that there may be a patient responsible charge related to this service. The patient expressed understanding and agreed to proceed.    I connected with [name] [name] on 06/08/21  at  10:50 AM EDT  EDT by telephone and verified that I am speaking with the correct person using two identifiers.  Location of Patient: Private Residence   Location of Provider: [place] [facility] [employer] Office    Persons participating in Telemedicine visit: [name] Rankins FNP-BC [name] [name]    History of Present Illness: Telemedicine visit for: Dislocated finger   Endorses dislocation of left pinky finger. Needs referral to hand specialist.  Diabetic Neuropathy Controlled with cymbalta 60 mg daily.    Incontinence of feces Well controlled. He only takes lomotil as prescribed and is aware this can cause constipation if overused.   Past Medical History:  Diagnosis Date   Barrett's esophagus dx 2016   Bronchitis    Chronic hepatitis C without hepatic coma ([place]) 10/31/2014   Depression    Diabetes ([place])    ED (erectile dysfunction)    GERD (gastroesophageal reflux disease)    Hepatitis C    Hiatal hernia 10/2014   3cm   Neuropathy    S/P transmetatarsal amputation of foot, left ([place]) 11/28/2020    Past Surgical History:  Procedure Laterality Date   AMPUTATION Left 11/15/2020   Procedure: LEFT TRANSMETATARSALS AMPUTATION;  Surgeon: Newt Minion, MD;  Location: [facility];  Service: Orthopedics;  Laterality: Left;   APPENDECTOMY     BILIARY STENT PLACEMENT N/A 02/27/2021   Procedure: BILIARY STENT  PLACEMENT;  Surgeon: Milus Banister, MD;  [facility];  Service: Endoscopy;  Laterality: N/A;   BILIARY STENT PLACEMENT N/A 03/27/2021   Procedure: BILIARY STENT PLACEMENT;  Surgeon: Jackquline Denmark, MD;  [facility];  Service: Endoscopy;  Laterality: N/A;   BIOPSY  03/27/2021   Procedure: BIOPSY;  Surgeon: Jackquline Denmark, MD;  [facility];  Service: Endoscopy;;   COLONOSCOPY N/A 02/16/2014   Procedure: COLONOSCOPY;  Surgeon: Gatha Mayer, MD;  [facility];  Service: Endoscopy;  Laterality: N/A;   COLONOSCOPY     ENDOSCOPIC RETROGRADE CHOLANGIOPANCREATOGRAPHY (ERCP) WITH PROPOFOL N/A 02/27/2021   Procedure: ENDOSCOPIC RETROGRADE CHOLANGIOPANCREATOGRAPHY (ERCP) WITH PROPOFOL;  Surgeon: Milus Banister, MD;  [facility];  Service: Endoscopy;  Laterality: N/A;   ERCP N/A 03/27/2021   Procedure: ENDOSCOPIC RETROGRADE CHOLANGIOPANCREATOGRAPHY (ERCP);  Surgeon: Jackquline Denmark, MD;  [facility];  Service: Endoscopy;  Laterality: N/A;   ESOPHAGOGASTRODUODENOSCOPY (EGD) WITH PROPOFOL N/A 05/06/2021   Procedure: ESOPHAGOGASTRODUODENOSCOPY (EGD) WITH PROPOFOL;  Surgeon: Mauri Pole, MD;  [facility];  Service: Endoscopy;  Laterality: N/A;   EUS N/A 02/27/2021   Procedure: UPPER ENDOSCOPIC ULTRASOUND (EUS) RADIAL;  Surgeon: Milus Banister, MD;  [facility];  Service: Endoscopy;  Laterality: N/A;   FINE NEEDLE ASPIRATION N/A 02/27/2021   Procedure: FINE NEEDLE ASPIRATION (FNA) LINEAR;  Surgeon: Milus Banister, MD;  [facility];  Service: Endoscopy;  Laterality: N/A;   IR IMAGING GUIDED PORT INSERTION  03/21/2021   SPHINCTEROTOMY  02/27/2021  Procedure: SPHINCTEROTOMY;  Surgeon: Milus Banister, MD;  [facility];  Service: Endoscopy;;   STENT REMOVAL  03/27/2021   Procedure: Lavell Islam REMOVAL;  Surgeon: Jackquline Denmark, MD;  [facility];  Service: Endoscopy;;   TONSILLECTOMY      Family  History  Problem Relation Age of Onset   Breast cancer Mother    Stroke Father    Alcohol abuse Father    Heart disease Maternal Grandfather    Pancreatic cancer Paternal Grandmother    Diabetes Paternal Grandfather    Colon cancer Neg Hx    Stomach cancer Neg Hx    Rectal cancer Neg Hx    Esophageal cancer Neg Hx     Social History   Socioeconomic History   Marital status: Single    Spouse name: Not on file   Number of children: Not on file   Years of education: Not on file   Highest education level: Not on file  Occupational History   Not on file  Tobacco Use   Smoking status: Every Day    Packs/day: 0.50    Years: 35.00    Pack years: 17.50    Types: Cigarettes   Smokeless tobacco: Never  Vaping Use   Vaping Use: Never used  Substance and Sexual Activity   Alcohol use: Not Currently    Comment: previous   Drug use: No   Sexual activity: Not on file  Other Topics Concern   Not on file  Social History Narrative   Not on file   Social Determinants of Health   Financial Resource Strain: High Risk   Difficulty of Paying Living Expenses: Very hard  Food Insecurity: Food Insecurity Present   Worried About Charity fundraiser in the Last Year: Sometimes true   Ran Out of Food in the Last Year: Sometimes true  Transportation Needs: Unmet Transportation Needs   Lack of Transportation (Medical): Yes   Lack of Transportation (Non-Medical): Yes  Physical Activity: Not on file  Stress: Not on file  Social Connections: Not on file     Observations/Objective: Awake, alert and oriented x 3   Review of Systems  Constitutional:  Negative for fever, malaise/fatigue and weight loss.  HENT: Negative.  Negative for nosebleeds.   Eyes: Negative.  Negative for blurred vision, double vision and photophobia.  Respiratory: Negative.  Negative for cough and shortness of breath.   Cardiovascular: Negative.  Negative for chest pain, palpitations and leg swelling.   Gastrointestinal: Negative.  Negative for heartburn, nausea and vomiting.  Musculoskeletal:  Positive for joint pain. Negative for myalgias.  Neurological: Negative.  Negative for dizziness, focal weakness, seizures and headaches.  Psychiatric/Behavioral: Negative.  Negative for suicidal ideas.    Assessment and Plan: Diagnoses and all orders for this visit:  Dislocation of finger, subsequent encounter -     Ambulatory referral to Hand Surgery  Diabetic polyneuropathy associated with type 2 diabetes mellitus (HCC) -     DULoxetine (CYMBALTA) 60 MG capsule; Take 1 capsule (60 mg total) by mouth daily.  Incontinence of feces, unspecified fecal incontinence type -     Discontinue: diphenoxylate-atropine (LOMOTIL) 2.5-0.025 MG tablet; Take 1 tablet by mouth 4 (four) times daily as needed for diarrhea or loose stools. -     diphenoxylate-atropine (LOMOTIL) 2.5-0.025 MG tablet; Take 1 tablet by mouth 4 (four) times daily as needed for diarrhea or loose stools.    Follow Up Instructions Return in about 3 months (around 09/06/2021).  I discussed the assessment and treatment plan with the patient. The patient was provided an opportunity to ask questions and all were answered. The patient agreed with the plan and demonstrated an understanding of the instructions.   The patient was advised to call back or seek an in-person evaluation if the symptoms worsen or if the condition fails to improve as anticipated.  I provided 10 minutes of non-face-to-face time during this encounter including median intraservice time, reviewing previous notes, labs, imaging, medications and explaining diagnosis and management.  [name], FNP-BC

## 2021-06-06 NOTE — Telephone Encounter (Signed)
Refilled today by Dede Query. Gardiner Rhyme, RN

## 2021-06-07 ENCOUNTER — Inpatient Hospital Stay: Payer: Medicaid Other

## 2021-06-07 ENCOUNTER — Other Ambulatory Visit (HOSPITAL_COMMUNITY): Payer: Self-pay

## 2021-06-07 ENCOUNTER — Other Ambulatory Visit: Payer: Self-pay

## 2021-06-07 VITALS — BP 148/90 | HR 96 | Temp 97.0°F | Resp 20

## 2021-06-07 DIAGNOSIS — Z5111 Encounter for antineoplastic chemotherapy: Secondary | ICD-10-CM | POA: Diagnosis not present

## 2021-06-07 DIAGNOSIS — C250 Malignant neoplasm of head of pancreas: Secondary | ICD-10-CM

## 2021-06-07 MED ORDER — SODIUM CHLORIDE 0.9% FLUSH
10.0000 mL | INTRAVENOUS | Status: DC | PRN
  Administered 2021-06-07: 10 mL

## 2021-06-07 MED ORDER — HEPARIN SOD (PORK) LOCK FLUSH 100 UNIT/ML IV SOLN
500.0000 [IU] | Freq: Once | INTRAVENOUS | Status: AC | PRN
  Administered 2021-06-07: 500 [IU]

## 2021-06-08 ENCOUNTER — Encounter: Payer: Self-pay | Admitting: Nurse Practitioner

## 2021-06-08 MED ORDER — DIPHENOXYLATE-ATROPINE 2.5-0.025 MG PO TABS
1.0000 | ORAL_TABLET | Freq: Four times a day (QID) | ORAL | 0 refills | Status: DC | PRN
  Filled 2021-06-08 – 2021-08-01 (×2): qty 60, 15d supply, fill #0

## 2021-06-09 ENCOUNTER — Other Ambulatory Visit (HOSPITAL_COMMUNITY): Payer: Self-pay

## 2021-06-09 ENCOUNTER — Other Ambulatory Visit: Payer: Self-pay

## 2021-06-10 ENCOUNTER — Other Ambulatory Visit (HOSPITAL_COMMUNITY): Payer: Self-pay

## 2021-06-10 ENCOUNTER — Other Ambulatory Visit: Payer: Self-pay

## 2021-06-11 ENCOUNTER — Other Ambulatory Visit (HOSPITAL_COMMUNITY): Payer: Self-pay

## 2021-06-11 ENCOUNTER — Telehealth: Payer: Self-pay | Admitting: Endocrinology

## 2021-06-11 ENCOUNTER — Other Ambulatory Visit: Payer: Self-pay

## 2021-06-11 NOTE — Telephone Encounter (Signed)
[facility] called to advise that a PA is need for [employer]. They have sent the request by fax and said they have not received any information back so they wanted to call and touch base regarding status. Sending message to [facility] [employer]

## 2021-06-12 ENCOUNTER — Ambulatory Visit (INDEPENDENT_AMBULATORY_CARE_PROVIDER_SITE_OTHER): Payer: Medicaid Other

## 2021-06-12 ENCOUNTER — Encounter: Payer: Self-pay | Admitting: Orthopedic Surgery

## 2021-06-12 ENCOUNTER — Ambulatory Visit (INDEPENDENT_AMBULATORY_CARE_PROVIDER_SITE_OTHER): Payer: Medicaid Other | Admitting: Orthopedic Surgery

## 2021-06-12 VITALS — BP 133/77 | HR 85 | Ht 70.0 in | Wt 141.8 lb

## 2021-06-12 DIAGNOSIS — M79645 Pain in left finger(s): Secondary | ICD-10-CM

## 2021-06-12 DIAGNOSIS — S62619A Displaced fracture of proximal phalanx of unspecified finger, initial encounter for closed fracture: Secondary | ICD-10-CM | POA: Diagnosis not present

## 2021-06-12 NOTE — Progress Notes (Signed)
Office Visit Note   Patient: [name] [name]           Date of Birth: Jan 16, 1961           MRN: [id number] Visit Date: 06/12/2021              Requested by: [name], NP [place],  [place] [address] PCP: [name], NP   Assessment & Plan: Visit Diagnoses:  1. Pain in left finger(s)   2. Closed displaced fracture of proximal phalanx of finger of left hand     Plan: Discussed with patient that he has a malunion of the left small finger proximal phalanx.  He healed in approximately 30 degrees of apex volar angulation.  He has no pain in his finger.  He can make a complete fist but he does have pseudo calling of this finger.  He injured his finger approximately 2 months ago but is unable to get treatment as he has been very busy.  He has stage IIIb pancreatic cancer has been undergoing chemotherapy.  We discussed that this point his options are to do an osteotomy to try straighten the finger out versus leave the finger alone.  I discussed that if we do an osteotomy he may end up with a straight but quite stiff finger.  There does get in his way sometimes such as when he is reaching into his pocket.  He is going to think about operative versus nonoperative treatment and call the office.  Follow-Up Instructions: No follow-ups on file.   Orders:  Orders Placed This Encounter  Procedures   XR Finger Little Left   No orders of the defined types were placed in this encounter.     Procedures: No procedures performed   Clinical Data: No additional findings.   Subjective: [name] Complaint  Patient presents with   Left Hand - New Patient (Initial Visit)    This is a 60 year old right-hand-dominant male who presents with a left small finger injury.  He injured his finger approximately 2 months ago after a ground-level fall.  He thought that he had just jammed his finger or dislocated it.  He did not seek treatment at that time.  He presents today for his  first visit regarding this injury.  He has history of pancreatic cancer and is currently being treated with his fifth round of chemotherapy.  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  He denies any hand or other injury in his hand.  He has no pain in the small finger but does bother him occasionally such as when he is reaching to his pocket for cell phone.   Review of Systems   Objective: Vital Signs: BP 133/77 (BP Location: Right Arm, Patient Position: Sitting, Cuff Size: Normal)   Pulse 85   Ht 5\' 10"  (1.778 m)   Wt 141 lb 12.8 oz (64.3 kg)   SpO2 97%   BMI 20.35 kg/m   Physical Exam Constitutional:      Appearance: Normal appearance.  Cardiovascular:     Pulses: Normal pulses.  Pulmonary:     Effort: Pulmonary effort is normal.  Skin:    General: Skin is warm and dry.     Capillary Refill: Capillary refill takes less than 2 seconds.  Neurological:     Mental Status: He is alert.    Left Hand Exam   Tenderness  The patient is experiencing no tenderness.   Range of  Motion  The patient has normal left wrist ROM.  Other  Erythema: absent Sensation: normal Pulse: present  Comments:  Pseudoclawing at the small finger MP joint w/ resulting flexion at the IP joint.  He has no pain w/ manipulation of the fracture.  There is no fragment movement at the fracture site.  He is able to make a complete fist.      Specialty Comments:  No specialty comments available.  Imaging: 3 views of the left small finger taken today reviewed interpreted by me.  He is a transverse fracture at the base of the left small finger proximal phalanx.  This is healing in about 30 degrees of apex volar angulation.   PMFS History: Patient Active Problem List   Diagnosis Date Noted   Closed displaced fracture of proximal phalanx of finger of left hand 06/12/2021   AKI (acute kidney injury) ([place]) 05/12/2021   Abdominal distension    Gastroparesis    Gastric outlet  obstruction 05/05/2021   Abdominal pain 05/05/2021   Dehydration 05/05/2021   Nausea & vomiting 05/04/2021   Port-A-Cath in place 04/24/2021   Malnutrition of moderate degree 03/27/2021   Hyperglycemia due to diabetes mellitus ([place]) 03/26/2021   Pancreatic cancer ([place]) 03/12/2021   Malignant neoplasm of head of pancreas ([place]) 03/12/2021   Jaundice 02/26/2021   Loss of weight 02/26/2021   S/P transmetatarsal amputation of foot, left ([place]) 11/28/2020   Hypertension associated with type 2 diabetes mellitus ([place]) 11/28/2020   Type 2 diabetes mellitus with diabetic neuropathy, unspecified ([place]) 11/28/2020   Hyperlipidemia associated with type 2 diabetes mellitus ([place]) 11/28/2020   GERD without esophagitis 11/28/2020   Major depression, recurrent, chronic ([place]) 11/28/2020   Aortic atherosclerosis ([place]) 11/28/2020   Diabetic neuropathy ([place]) 11/28/2020   Osteomyelitis ([place]) 11/14/2020   Sepsis ([place]) 10/28/2020   Community acquired pneumonia of right lower lobe of lung    Elevated LFTs    Hypophosphatemia    Acute metabolic encephalopathy    Acute maxillary sinusitis    Depression    Physical deconditioning    Pneumonia 05/14/2020   DTs (delirium tremens) ([place]) 05/13/2020   Hyponatremia 05/13/2020   Hypokalemia 05/13/2020   SIRS (systemic inflammatory response syndrome) ([place]) 05/13/2020   Sepsis with acute hypoxic respiratory failure without septic shock ([place])    Alcohol abuse with alcohol-induced mood disorder ([place]) 12/17/2017   Hereditary hemochromatosis ([place]) 11/23/2017   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   Essential hypertension 04/02/2017   Hypogonadism male 08/13/2016   ETOH abuse 06/22/2016   Low serum testosterone level 01/08/2016   Type 2 diabetes mellitus with other specified complication ([place]) 16/38/4665   Erectile dysfunction 09/12/2015   Hepatic cirrhosis ([place]) 01/10/2015   Chronic hepatitis C without hepatic coma ([place]) 10/31/2014   Tobacco use  disorder 08/03/2013   Diabetes type 2, controlled ([place]) 04/27/2013   Past Medical History:  Diagnosis Date   Barrett's esophagus dx 2016   Bronchitis    Chronic hepatitis C without hepatic coma ([place]) 10/31/2014   Depression    Diabetes ([place])    ED (erectile dysfunction)    GERD (gastroesophageal reflux disease)    Hepatitis C    Hiatal hernia 10/2014   3cm   Neuropathy    S/P transmetatarsal amputation of foot, left ([place]) 11/28/2020    Family History  Problem Relation Age of Onset   Breast cancer Mother    Stroke Father    Alcohol abuse Father    Heart disease  Maternal Grandfather    Pancreatic cancer Paternal Grandmother    Diabetes Paternal Grandfather    Colon cancer Neg Hx    Stomach cancer Neg Hx    Rectal cancer Neg Hx    Esophageal cancer Neg Hx     Past Surgical History:  Procedure Laterality Date   AMPUTATION Left 11/15/2020   Procedure: LEFT TRANSMETATARSALS AMPUTATION;  Surgeon: Newt Minion, MD;  Location: [facility];  Service: Orthopedics;  Laterality: Left;   APPENDECTOMY     BILIARY STENT PLACEMENT N/A 02/27/2021   Procedure: BILIARY STENT PLACEMENT;  Surgeon: Milus Banister, MD;  [facility];  Service: Endoscopy;  Laterality: N/A;   BILIARY STENT PLACEMENT N/A 03/27/2021   Procedure: BILIARY STENT PLACEMENT;  Surgeon: Jackquline Denmark, MD;  [facility];  Service: Endoscopy;  Laterality: N/A;   BIOPSY  03/27/2021   Procedure: BIOPSY;  Surgeon: Jackquline Denmark, MD;  [facility];  Service: Endoscopy;;   COLONOSCOPY N/A 02/16/2014   Procedure: COLONOSCOPY;  Surgeon: Gatha Mayer, MD;  [facility];  Service: Endoscopy;  Laterality: N/A;   COLONOSCOPY     ENDOSCOPIC RETROGRADE CHOLANGIOPANCREATOGRAPHY (ERCP) WITH PROPOFOL N/A 02/27/2021   Procedure: ENDOSCOPIC RETROGRADE CHOLANGIOPANCREATOGRAPHY (ERCP) WITH PROPOFOL;  Surgeon: Milus Banister, MD;  [facility];  Service: Endoscopy;  Laterality: N/A;   ERCP N/A  03/27/2021   Procedure: ENDOSCOPIC RETROGRADE CHOLANGIOPANCREATOGRAPHY (ERCP);  Surgeon: Jackquline Denmark, MD;  [facility];  Service: Endoscopy;  Laterality: N/A;   ESOPHAGOGASTRODUODENOSCOPY (EGD) WITH PROPOFOL N/A 05/06/2021   Procedure: ESOPHAGOGASTRODUODENOSCOPY (EGD) WITH PROPOFOL;  Surgeon: Mauri Pole, MD;  [facility];  Service: Endoscopy;  Laterality: N/A;   EUS N/A 02/27/2021   Procedure: UPPER ENDOSCOPIC ULTRASOUND (EUS) RADIAL;  Surgeon: Milus Banister, MD;  [facility];  Service: Endoscopy;  Laterality: N/A;   FINE NEEDLE ASPIRATION N/A 02/27/2021   Procedure: FINE NEEDLE ASPIRATION (FNA) LINEAR;  Surgeon: Milus Banister, MD;  [facility];  Service: Endoscopy;  Laterality: N/A;   IR IMAGING GUIDED PORT INSERTION  03/21/2021   SPHINCTEROTOMY  02/27/2021   Procedure: SPHINCTEROTOMY;  Surgeon: Milus Banister, MD;  [facility];  Service: Endoscopy;;   STENT REMOVAL  03/27/2021   Procedure: STENT REMOVAL;  Surgeon: Jackquline Denmark, MD;  [facility];  Service: Endoscopy;;   TONSILLECTOMY     Social History   Occupational History   Not on file  Tobacco Use   Smoking status: Every Day    Packs/day: 0.50    Years: 35.00    Pack years: 17.50    Types: Cigarettes   Smokeless tobacco: Never  Vaping Use   Vaping Use: Never used  Substance and Sexual Activity   Alcohol use: Not Currently    Comment: previous   Drug use: No   Sexual activity: Not on file

## 2021-06-16 ENCOUNTER — Other Ambulatory Visit: Payer: Self-pay

## 2021-06-16 ENCOUNTER — Telehealth: Payer: Self-pay | Admitting: Nurse Practitioner

## 2021-06-16 NOTE — Telephone Encounter (Signed)
Needs nurse visit for vaccines

## 2021-06-17 NOTE — Telephone Encounter (Signed)
Spoke to Pt and he has an appt Thursday 06/19/2021.

## 2021-06-19 ENCOUNTER — Encounter: Payer: Self-pay | Admitting: Hematology and Oncology

## 2021-06-19 ENCOUNTER — Inpatient Hospital Stay (HOSPITAL_BASED_OUTPATIENT_CLINIC_OR_DEPARTMENT_OTHER): Payer: Medicaid Other | Admitting: Hematology and Oncology

## 2021-06-19 ENCOUNTER — Inpatient Hospital Stay: Payer: Medicaid Other

## 2021-06-19 ENCOUNTER — Other Ambulatory Visit (HOSPITAL_COMMUNITY): Payer: Self-pay

## 2021-06-19 ENCOUNTER — Ambulatory Visit: Payer: Medicaid Other | Attending: Nurse Practitioner

## 2021-06-19 ENCOUNTER — Other Ambulatory Visit: Payer: Self-pay

## 2021-06-19 VITALS — BP 154/89 | HR 78 | Temp 98.0°F | Resp 16 | Ht 70.0 in | Wt 147.1 lb

## 2021-06-19 DIAGNOSIS — C250 Malignant neoplasm of head of pancreas: Secondary | ICD-10-CM

## 2021-06-19 DIAGNOSIS — Z95828 Presence of other vascular implants and grafts: Secondary | ICD-10-CM | POA: Diagnosis not present

## 2021-06-19 DIAGNOSIS — Z23 Encounter for immunization: Secondary | ICD-10-CM

## 2021-06-19 DIAGNOSIS — K8689 Other specified diseases of pancreas: Secondary | ICD-10-CM | POA: Diagnosis not present

## 2021-06-19 DIAGNOSIS — Z5111 Encounter for antineoplastic chemotherapy: Secondary | ICD-10-CM | POA: Diagnosis not present

## 2021-06-19 LAB — CBC WITH DIFFERENTIAL (CANCER CENTER ONLY)
Abs Immature Granulocytes: 0.01 10*3/uL (ref 0.00–0.07)
Basophils Absolute: 0 10*3/uL (ref 0.0–0.1)
Basophils Relative: 1 %
Eosinophils Absolute: 0.1 10*3/uL (ref 0.0–0.5)
Eosinophils Relative: 1 %
HCT: 32.3 % — ABNORMAL LOW (ref 39.0–52.0)
Hemoglobin: 10.5 g/dL — ABNORMAL LOW (ref 13.0–17.0)
Immature Granulocytes: 0 %
Lymphocytes Relative: 34 %
Lymphs Abs: 2.2 10*3/uL (ref 0.7–4.0)
MCH: 29.1 pg (ref 26.0–34.0)
MCHC: 32.5 g/dL (ref 30.0–36.0)
MCV: 89.5 fL (ref 80.0–100.0)
Monocytes Absolute: 1.2 10*3/uL — ABNORMAL HIGH (ref 0.1–1.0)
Monocytes Relative: 18 %
Neutro Abs: 3 10*3/uL (ref 1.7–7.7)
Neutrophils Relative %: 46 %
Platelet Count: 153 10*3/uL (ref 150–400)
RBC: 3.61 MIL/uL — ABNORMAL LOW (ref 4.22–5.81)
RDW: 15.9 % — ABNORMAL HIGH (ref 11.5–15.5)
WBC Count: 6.5 10*3/uL (ref 4.0–10.5)
nRBC: 0 % (ref 0.0–0.2)

## 2021-06-19 LAB — CMP (CANCER CENTER ONLY)
ALT: 13 U/L (ref 0–44)
AST: 23 U/L (ref 15–41)
Albumin: 3.4 g/dL — ABNORMAL LOW (ref 3.5–5.0)
Alkaline Phosphatase: 80 U/L (ref 38–126)
Anion gap: 7 (ref 5–15)
BUN: 9 mg/dL (ref 6–20)
CO2: 26 mmol/L (ref 22–32)
Calcium: 9.1 mg/dL (ref 8.9–10.3)
Chloride: 108 mmol/L (ref 98–111)
Creatinine: 0.62 mg/dL (ref 0.61–1.24)
GFR, Estimated: >60 mL/min (ref >60)
Glucose, Bld: 190 mg/dL — ABNORMAL HIGH (ref 70–99)
Potassium: 4 mmol/L (ref 3.5–5.1)
Sodium: 141 mmol/L (ref 135–145)
Total Bilirubin: 0.3 mg/dL (ref 0.3–1.2)
Total Protein: 6.9 g/dL (ref 6.5–8.1)

## 2021-06-19 MED ORDER — SODIUM CHLORIDE 0.9 % IV SOLN
150.0000 mg | Freq: Once | INTRAVENOUS | Status: AC
  Administered 2021-06-19: 150 mg via INTRAVENOUS
  Filled 2021-06-19: qty 150

## 2021-06-19 MED ORDER — HEPARIN SOD (PORK) LOCK FLUSH 100 UNIT/ML IV SOLN: 500.0000 [IU] | Freq: Once | INTRAVENOUS | Status: DC | PRN

## 2021-06-19 MED ORDER — SODIUM CHLORIDE 0.9 % IV SOLN
150.0000 mg/m2 | Freq: Once | INTRAVENOUS | Status: AC
  Administered 2021-06-19: 280 mg via INTRAVENOUS
  Filled 2021-06-19: qty 14

## 2021-06-19 MED ORDER — SODIUM CHLORIDE 0.9 % IV SOLN
400.0000 mg/m2 | Freq: Once | INTRAVENOUS | Status: AC
  Administered 2021-06-19: 748 mg via INTRAVENOUS
  Filled 2021-06-19: qty 37.4

## 2021-06-19 MED ORDER — OXYCODONE HCL 5 MG PO TABS
5.0000 mg | ORAL_TABLET | Freq: Four times a day (QID) | ORAL | 0 refills | Status: DC | PRN
  Filled 2021-06-19: qty 60, 15d supply, fill #0

## 2021-06-19 MED ORDER — DEXTROSE 5 % IV SOLN: Freq: Once | INTRAVENOUS | Status: AC

## 2021-06-19 MED ORDER — SODIUM CHLORIDE 0.9% FLUSH: 10.0000 mL | INTRAVENOUS | Status: DC | PRN

## 2021-06-19 MED ORDER — SODIUM CHLORIDE 0.9 % IV SOLN
2400.0000 mg/m2 | INTRAVENOUS | Status: DC
  Administered 2021-06-19: 4500 mg via INTRAVENOUS
  Filled 2021-06-19: qty 90

## 2021-06-19 MED ORDER — ATROPINE SULFATE 1 MG/ML IV SOLN
0.5000 mg | Freq: Once | INTRAVENOUS | Status: AC | PRN
  Administered 2021-06-19: 0.5 mg via INTRAVENOUS
  Filled 2021-06-19: qty 1

## 2021-06-19 MED ORDER — SODIUM CHLORIDE 0.9 % IV SOLN
10.0000 mg | Freq: Once | INTRAVENOUS | Status: AC
  Administered 2021-06-19: 10 mg via INTRAVENOUS
  Filled 2021-06-19: qty 10

## 2021-06-19 MED ORDER — PALONOSETRON HCL INJECTION 0.25 MG/5ML
0.2500 mg | Freq: Once | INTRAVENOUS | Status: AC
  Administered 2021-06-19: 0.25 mg via INTRAVENOUS
  Filled 2021-06-19: qty 5

## 2021-06-19 MED ORDER — SODIUM CHLORIDE 0.9% FLUSH
10.0000 mL | Freq: Once | INTRAVENOUS | Status: AC
  Administered 2021-06-19: 10 mL

## 2021-06-19 MED ORDER — OXALIPLATIN CHEMO INJECTION 100 MG/20ML
85.0000 mg/m2 | Freq: Once | INTRAVENOUS | Status: AC
  Administered 2021-06-19: 160 mg via INTRAVENOUS
  Filled 2021-06-19: qty 32

## 2021-06-19 NOTE — Progress Notes (Signed)
[facility] [phone]   [phone]  PROGRESS NOTE  Patient Care Team: [name], NP as PCP - General (Nurse Practitioner) Havery Moros, Carlota Raspberry, MD as Consulting Physician (Gastroenterology) Orson Slick, MD as Consulting Physician (Oncology) Royston Bake, RN as Registered Nurse  Hematological/Oncological History # Adenocarcinoma of the Pancreas, Stage IIB 02/26/2021: patient seen by GI for jaundice and pancreatic mass causing CBD obstruction. CT abdomen showed pancreatic head mass (4.4 x 3.7 cm) and enlarged portocaval lymph nodes. 02/27/2021: ERCP with placement of a nonmetallic biliary stent. FNA of pancreatic head showed malignant cells consistent with pancreatic adenocarcinoma. 03/05/2021: CT Chest showed no evidence of metastatic disease in the chest. 03/12/2021: establish care with Dr. Lorenso Courier 04/10/2021: Cycle 1 Day 1 of FOLFIRINOX 04/24/2021: Cycle 2 Day 1 of FOLFIRINOX 05/22/2021: Cycle 3 Day 1 of FOLFIRINOX. Delayed x 2 weeks due to hospitalization for poor po intake and dehydration due to gastroparesis. 06/05/2021: Cycle 4 Day 1 of FOLFIRINOX 06/19/2021: Cycle 5 Day 1 of FOLFIRINOX  Interval History:  [name] [name] 60 y.o. male with medical history significant for borderline resectable pancreatic cancer presents for a follow up visit. The patient's last visit was on 06/05/2021. He presents to start cycle 5 of FOLFIRINOX.  On exam today [name] reports he has been well overall since her last visit.  He does endorse having issues with gas as well as vaginal episodes of diarrhea with urgency.  He notes that he has been trying to walk 5000 steps per day and has been tolerating this quite well.  He notes that his weight is stable currently at 147 pounds.  He does have some rare nausea and vomiting which comes and goes responds well to his as needed medications.  He currently denies any fevers, chills, sweats.  Full 10 point ROS is listed  below.  He is willing and able to proceed with treatment at this time.  Of note his mom was unfortunately diagnosed with colon cancer and is under the care of Dr. Burr Medico.  MEDICAL HISTORY:  Past Medical History:  Diagnosis Date   Barrett's esophagus dx 2016   Bronchitis    Chronic hepatitis C without hepatic coma ([place]) 10/31/2014   Depression    Diabetes ([place])    ED (erectile dysfunction)    GERD (gastroesophageal reflux disease)    Hepatitis C    Hiatal hernia 10/2014   3cm   Neuropathy    S/P transmetatarsal amputation of foot, left ([place]) 11/28/2020    SURGICAL HISTORY: Past Surgical History:  Procedure Laterality Date   AMPUTATION Left 11/15/2020   Procedure: LEFT TRANSMETATARSALS AMPUTATION;  Surgeon: Newt Minion, MD;  Location: [facility];  Service: Orthopedics;  Laterality: Left;   APPENDECTOMY     BILIARY STENT PLACEMENT N/A 02/27/2021   Procedure: BILIARY STENT PLACEMENT;  Surgeon: Milus Banister, MD;  Location: [facility];  Service: Endoscopy;  Laterality: N/A;   BILIARY STENT PLACEMENT N/A 03/27/2021   Procedure: BILIARY STENT PLACEMENT;  Surgeon: Jackquline Denmark, MD;  Location: [facility];  Service: Endoscopy;  Laterality: N/A;   BIOPSY  03/27/2021   Procedure: BIOPSY;  Surgeon: Jackquline Denmark, MD;  Location: [facility];  Service: Endoscopy;;   COLONOSCOPY N/A 02/16/2014   Procedure: COLONOSCOPY;  Surgeon: Gatha Mayer, MD;  Location: [facility];  Service: Endoscopy;  Laterality: N/A;   COLONOSCOPY     ENDOSCOPIC RETROGRADE CHOLANGIOPANCREATOGRAPHY (ERCP) WITH PROPOFOL N/A 02/27/2021   Procedure: ENDOSCOPIC RETROGRADE CHOLANGIOPANCREATOGRAPHY (  ERCP) WITH PROPOFOL;  Surgeon: Milus Banister, MD;  [facility];  Service: Endoscopy;  Laterality: N/A;   ERCP N/A 03/27/2021   Procedure: ENDOSCOPIC RETROGRADE CHOLANGIOPANCREATOGRAPHY (ERCP);  Surgeon: Jackquline Denmark, MD;  Location: [facility];  Service: Endoscopy;  Laterality: N/A;   ESOPHAGOGASTRODUODENOSCOPY  (EGD) WITH PROPOFOL N/A 05/06/2021   Procedure: ESOPHAGOGASTRODUODENOSCOPY (EGD) WITH PROPOFOL;  Surgeon: Mauri Pole, MD;  [facility]: [facility];  Service: Endoscopy;  Laterality: N/A;   EUS N/A 02/27/2021   Procedure: UPPER ENDOSCOPIC ULTRASOUND (EUS) RADIAL;  Surgeon: Milus Banister, MD;  Location: [facility];  Service: Endoscopy;  Laterality: N/A;   FINE NEEDLE ASPIRATION N/A 02/27/2021   Procedure: FINE NEEDLE ASPIRATION (FNA) LINEAR;  Surgeon: Milus Banister, MD;  Location: [facility];  Service: Endoscopy;  Laterality: N/A;   IR IMAGING GUIDED PORT INSERTION  03/21/2021   SPHINCTEROTOMY  02/27/2021   Procedure: SPHINCTEROTOMY;  Surgeon: Milus Banister, MD;  Location: [facility];  Service: Endoscopy;;   STENT REMOVAL  03/27/2021   Procedure: Lavell Islam REMOVAL;  Surgeon: Jackquline Denmark, MD;  Location[facility];  Service: Endoscopy;;   TONSILLECTOMY      SOCIAL HISTORY: Social History   Socioeconomic History   Marital status: Single    Spouse name: Not on file   Number of children: Not on file   Years of education: Not on file   Highest education level: Not on file  Occupational History   Not on file  Tobacco Use   Smoking status: Every Day    Packs/day: 0.50    Years: 35.00    Pack years: 17.50    Types: Cigarettes   Smokeless tobacco: Never  Vaping Use   Vaping Use: Never used  Substance and Sexual Activity   Alcohol use: Not Currently    Comment: previous   Drug use: No   Sexual activity: Not on file  Other Topics Concern   Not on file  Social History Narrative   Not on file   Social Determinants of Health   Financial Resource Strain: High Risk   Difficulty of Paying Living Expenses: Very hard  Food Insecurity: Food Insecurity Present   Worried About Charity fundraiser in the Last Year: Sometimes true   Arboriculturist in the Last Year: Sometimes true  Transportation Needs: Public librarian (Medical): Yes    Lack of Transportation (Non-Medical): Yes  Physical Activity: Not on file  Stress: Not on file  Social Connections: Not on file  Intimate Partner Violence: Not on file    FAMILY HISTORY: Family History  Problem Relation Age of Onset   Breast cancer Mother    Stroke Father    Alcohol abuse Father    Heart disease Maternal Grandfather    Pancreatic cancer Paternal Grandmother    Diabetes Paternal Grandfather    Colon cancer Neg Hx    Stomach cancer Neg Hx    Rectal cancer Neg Hx    Esophageal cancer Neg Hx     ALLERGIES:  is allergic to bee venom and lactose intolerance (gi).  MEDICATIONS:  Current Outpatient Medications  Medication Sig Dispense Refill   albuterol (VENTOLIN HFA) 108 (90 Base) MCG/ACT inhaler Inhale 2 puffs into the lungs every 6 (six) hours as needed for wheezing or shortness of breath. 18 g 0   atorvastatin (LIPITOR) 20 MG tablet Take 1 tablet (20 mg total) by mouth daily. 90 tablet 0   Continuous Blood Gluc  Sensor (FREESTYLE LIBRE 2 SENSOR) MISC Use as directed every 14 days. 6 each 3   diphenoxylate-atropine (LOMOTIL) 2.5-0.025 MG tablet Take 1 tablet by mouth 4 (four) times daily as needed for diarrhea or loose stools. 60 tablet 1   diphenoxylate-atropine (LOMOTIL) 2.5-0.025 MG tablet Take 1 tablet by mouth 4 (four) times daily as needed for diarrhea or loose stools. 60 tablet 0   DULoxetine (CYMBALTA) 60 MG capsule Take 1 capsule (60 mg total) by mouth daily. 90 capsule 1   EPINEPHrine 0.3 mg/0.3 mL IJ SOAJ injection Inject 0.3 mg into the muscle once as needed for anaphylaxis.     folic acid (FOLVITE) 1 MG tablet Take 1 tablet (1 mg total) by mouth in the morning.     glucose blood (TRUE METRIX BLOOD GLUCOSE TEST) test strip Use as instructed. Check blood glucose level by fingerstick 3-4 times per day.  E11.65 200 each 12   insulin glargine (LANTUS SOLOSTAR) 100 UNIT/ML Solostar Pen Inject 10 Units into the skin 2 (two) times daily. 15 mL 0   insulin lispro  (HUMALOG) 100 UNIT/ML KwikPen Inject 15 Units into the skin 3 (three) times daily with meals. 45 mL 3   Insulin Pen Needle (TECHLITE PEN NEEDLES) 32G X 4 MM MISC USE AS INSTRUCTED INTO THE SKIN TWICE DAILY 100 each 11   Insulin Pen Needle (TRUEPLUS PEN NEEDLES) 32G X 4 MM MISC Use as instructed. Inject into the skin twice daily 200 each 6   lidocaine-prilocaine (EMLA) cream Apply 1 application topically as directed as needed. (Patient taking differently: Apply 1 application topically once as needed (prior to port access).) 30 g 0   lipase/protease/amylase (CREON) 36000 UNITS CPEP capsule Take 2 capsules (72,000 Units total) by mouth 3 (three) times daily with meals. May also take 1 capsule (36,000 Units total) as needed (with snacks). 210 capsule 3   losartan (COZAAR) 50 MG tablet Take 1 tablet (50 mg total) by mouth daily. 90 tablet 1   magic mouthwash w/lidocaine SOLN Take 5 mLs by mouth 4 (four) times daily as needed. 400 mL 0   metoCLOPramide (REGLAN) 5 MG tablet Take 1 tablet (5 mg total) by mouth 4 (four) times daily -  before meals and at bedtime. 90 tablet 1   mirtazapine (REMERON) 30 MG tablet Take 1 tablet (30 mg total) by mouth at bedtime. 90 tablet 1   Multiple Vitamins-Minerals (THEREMS-M) TABS Take 1 tablet by mouth in the morning.     nystatin-lidocaine-diphenhydrAMINE-alum & mag hydroxide-simeth-sterile water Take 5 mls by mouth four times a day as needed 400 mL 0   omeprazole (PRILOSEC) 40 MG capsule Take 1 capsule (40 mg total) by mouth in the morning.     ondansetron (ZOFRAN) 8 MG tablet Take 1 tablet (8 mg total) by mouth 2 (two) times daily as needed. Start on day 3 after chemotherapy. (Patient taking differently: Take 8 mg by mouth 2 (two) times daily as needed for nausea or vomiting. Start on day 3 after chemotherapy.) 30 tablet 1   polyethylene glycol powder (GLYCOLAX/MIRALAX) 17 GM/SCOOP powder Take 17 g by mouth at bedtime. 238 g 0   prochlorperazine (COMPAZINE) 10 MG tablet  Take 1 tablet (10 mg total) by mouth every 6 (six) hours as needed (Nausea or vomiting). 30 tablet 1   TRUEplus Lancets 28G MISC Use as instructed. Check blood glucose level by fingerstick 3-4 times per day.   E11.65 200 each 3   No current facility-administered medications for this  visit.   Facility-Administered Medications Ordered in Other Visits  Medication Dose Route Frequency Provider Last Rate Last Admin   atropine injection 0.5 mg  0.5 mg Intravenous Once PRN Orson Slick, MD       fluorouracil (ADRUCIL) 4,500 mg in sodium chloride 0.9 % 60 mL chemo infusion  2,400 mg/m2 (Treatment Plan Recorded) Intravenous 1 day or 1 dose Ledell Peoples IV, MD       heparin lock flush 100 unit/mL  500 Units Intracatheter Once PRN Orson Slick, MD       irinotecan (CAMPTOSAR) 280 mg in sodium chloride 0.9 % 500 mL chemo infusion  150 mg/m2 (Treatment Plan Recorded) Intravenous Once Orson Slick, MD       leucovorin 748 mg in sodium chloride 0.9 % 250 mL infusion  400 mg/m2 (Treatment Plan Recorded) Intravenous Once Orson Slick, MD       oxaliplatin (ELOXATIN) 160 mg in dextrose 5 % 500 mL chemo infusion  85 mg/m2 (Treatment Plan Recorded) Intravenous Once Orson Slick, MD       sodium chloride flush (NS) 0.9 % injection 10 mL  10 mL Intracatheter PRN Orson Slick, MD        REVIEW OF SYSTEMS:   Constitutional: ( - ) fevers, ( - )  chills , ( - ) night sweats Eyes: ( - ) blurriness of vision, ( - ) double vision, ( - ) watery eyes Ears, nose, mouth, throat, and face: ( - ) mucositis, ( - ) sore throat Respiratory: ( - ) cough, ( - ) dyspnea, ( - ) wheezes Cardiovascular: ( - ) palpitation, ( - ) chest discomfort, ( - ) lower extremity swelling Gastrointestinal:  ( - ) nausea, ( - ) heartburn, ( - ) change in bowel habits Skin: ( - ) abnormal skin rashes Lymphatics: ( - ) new lymphadenopathy, ( - ) easy bruising Neurological: ( - ) numbness, ( - ) tingling, ( - ) new  weaknesses Behavioral/Psych: ( - ) mood change, ( - ) new changes  All other systems were reviewed with the patient and are negative.  PHYSICAL EXAMINATION: ECOG PERFORMANCE STATUS: 1 - Symptomatic but completely ambulatory  Vitals:   06/19/21 1053  BP: (!) 154/89  Pulse: 78  Resp: 16  Temp: 98 F (36.7 C)  SpO2: 100%      Filed Weights   06/19/21 1053  Weight: 147 lb 1.6 oz (66.7 kg)      GENERAL: Well-appearing middle-age Caucasian male, alert, no distress and comfortable SKIN: skin color, texture, turgor are normal, no rashes or significant lesions.  EYES: conjunctiva are pink and non-injected, sclera clear LUNGS: clear to auscultation and percussion with normal breathing effort HEART: regular rate & rhythm and no murmurs and no lower extremity edema PSYCH: alert & oriented x 3, fluent speech NEURO: no focal motor/sensory deficits  LABORATORY DATA:  I have reviewed the data as listed CBC Latest Ref Rng & Units 06/19/2021 06/04/2021 05/22/2021  WBC 4.0 - 10.5 K/uL 6.5 6.9 15.2(H)  Hemoglobin 13.0 - 17.0 g/dL 10.5(L) 11.8(L) 12.3(L)  Hematocrit 39.0 - 52.0 % 32.3(L) 35.1(L) 37.0(L)  Platelets 150 - 400 K/uL 153 262 330    CMP Latest Ref Rng & Units 06/19/2021 06/04/2021 05/22/2021  Glucose 70 - 99 mg/dL 190(H) 199(H) 462(H)  BUN 6 - 20 mg/dL 9 7 17   Creatinine 0.61 - 1.24 mg/dL 0.62 0.62 0.78  Sodium 135 -  145 mmol/L 141 141 131(L)  Potassium 3.5 - 5.1 mmol/L 4.0 3.3(L) 4.5  Chloride 98 - 111 mmol/L 108 106 93(L)  CO2 22 - 32 mmol/L 26 24 26   Calcium 8.9 - 10.3 mg/dL 9.1 9.3 10.0  Total Protein 6.5 - 8.1 g/dL 6.9 7.6 7.8  Total Bilirubin 0.3 - 1.2 mg/dL 0.3 0.4 0.7  Alkaline Phos 38 - 126 U/L 80 107 120  AST 15 - 41 U/L 23 27 22   ALT 0 - 44 U/L 13 15 15     RADIOGRAPHIC STUDIES: I have personally reviewed the radiological images as listed and agreed with the findings in the report: Borderline resectable pancreatic mass with no evidence of metastatic  disease. No results found.  ASSESSMENT & PLAN [name] [name] 60 y.o. male with medical history significant for borderline resectable pancreatic cancer presents for a follow up visit.   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  The patient does still have an elevation in bilirubin which likely represents a decline from the time of the latest stent placement.  He was evaluated by surgery who determined this is a borderline resectable tumor.  We will plan to start the patient on neoadjuvant chemotherapy.  The 2 options would be Gem/Abraxane or FOLFIRINOX.  Given his excellent functional status I do believe he would be a good candidate for FOLFIRINOX.   # Adenocarcinoma of the Pancreas, Stage IIB -- At this time disease appears borderline resectable.  We will proceed with neoadjuvant chemotherapy.  At this time would prefer a regimen of FOLFIRINOX given his good baseline health. -- case reviewed by surgery, who agrees his disease is borderline resectable.  -- baseline elevations in both CA 19-9 and CEA. Continue to monitor during treatment.  --Staging scans complete.  No need for further imaging at this time. Will plan for repeat imaging in approximately 8 cycles to reassess --Reassuringly during his admission for intractable nausea and vomiting a CT scan of the abdomen showed marked response in the tumor to the chemotherapy treatment. -- Cycle 1 Day 1 of FOLFIRINOX started 04/10/2021 Plan:  --proceed with Cycle 5 Day 1 of treatment today  -- RTC in 2 weeks for Cycle 6 of treatment  #Gastroparesis #Nausea/Vomiting/Poor PO Intake --patient advanced diet to solid foods.  --nausea meds as below. --continue to work on glycemic control --continue to monitor.   #Chemotherapy induced Anemia --Hgb 10.5 today. Plt 153 and WBC 6.5 --ok to proceed with treatment --continue  to monitor   # Pain Control -- Patient currently taking ibuprofen for his abdominal pain --continue oxycodone 5 to 10 mg every 6 hours as needed   #Supportive Care -- chemotherapy education complete -- port placed -- zofran 8mg  q8H PRN and compazine 10mg  PO q6H for nausea --trial of Creon called into pharmacy to help with loose stools. -- EMLA cream for port  No orders of the defined types were placed in this encounter.  All questions were answered. The patient knows to call the clinic with any problems, questions or concerns.  A total of more than 30 minutes were spent on this encounter with face-to-face time and non-face-to-face time, including preparing to see the patient, ordering tests and/or medications, counseling the patient and coordination of care as outlined above.   Ledell Peoples, MD Department of Hematology/Oncology [facility] Phone: [phone] Pager: [phone] Email:  [email or web address]  06/19/2021 12:45 PM

## 2021-06-19 NOTE — Patient Instructions (Signed)
[facility]  Discharge Instructions: Thank you for choosing [facility] to provide your oncology and hematology care.   If you have a lab appointment with the [facility], please go directly to the [facility] and check in at the registration area.   Wear comfortable clothing and clothing appropriate for easy access to any Portacath or PICC line.   We strive to give you quality time with your provider. You may need to reschedule your appointment if you arrive late (15 or more minutes).  Arriving late affects you and other patients whose appointments are after yours.  Also, if you miss three or more appointments without notifying the office, you may be dismissed from the clinic at the provider's discretion.      For prescription refill requests, have your pharmacy contact our office and allow 72 hours for refills to be completed.    Today you received the following chemotherapy and/or immunotherapy agents oxaliplatin, leucovorin, irinotecan, and 5 FU      To help prevent nausea and vomiting after your treatment, we encourage you to take your nausea medication as directed.  BELOW ARE SYMPTOMS THAT SHOULD BE REPORTED IMMEDIATELY: *FEVER GREATER THAN 100.4 F (38 C) OR HIGHER *CHILLS OR SWEATING *NAUSEA AND VOMITING THAT IS NOT CONTROLLED WITH YOUR NAUSEA MEDICATION *UNUSUAL SHORTNESS OF BREATH *UNUSUAL BRUISING OR BLEEDING *URINARY PROBLEMS (pain or burning when urinating, or frequent urination) *BOWEL PROBLEMS (unusual diarrhea, constipation, pain near the anus) TENDERNESS IN MOUTH AND THROAT WITH OR WITHOUT PRESENCE OF ULCERS (sore throat, sores in mouth, or a toothache) UNUSUAL RASH, SWELLING OR PAIN  UNUSUAL VAGINAL DISCHARGE OR ITCHING   Items with * indicate a potential emergency and should be followed up as soon as possible or go to the Emergency Department if any problems should occur.  Please show the CHEMOTHERAPY ALERT CARD or  IMMUNOTHERAPY ALERT CARD at check-in to the Emergency Department and triage nurse.  Should you have questions after your visit or need to cancel or reschedule your appointment, please contact [facility]  Dept: [phone]  and follow the prompts.  Office hours are 8:00 a.m. to 4:30 p.m. Monday - Friday. Please note that voicemails left after 4:00 p.m. may not be returned until the following business day.  We are closed weekends and major holidays. You have access to a nurse at all times for urgent questions. Please call the main number to the clinic Dept: [phone] and follow the prompts.   For any non-urgent questions, you may also contact your provider using MyChart. We now offer e-Visits for anyone 80 and older to request care online for non-urgent symptoms. For details visit [email or web address].   Also download the MyChart app! Go to the app store, search "MyChart", open the app, select [facility], and log in with your MyChart username and password.  Due to Covid, a mask is required upon entering the hospital/clinic. If you do not have a mask, one will be given to you upon arrival. For doctor visits, patients may have 1 support person aged 30 or older with them. For treatment visits, patients cannot have anyone with them due to current Covid guidelines and our immunocompromised population.

## 2021-06-21 ENCOUNTER — Other Ambulatory Visit: Payer: Self-pay

## 2021-06-21 ENCOUNTER — Inpatient Hospital Stay: Payer: Medicaid Other

## 2021-06-21 VITALS — BP 127/72 | HR 87 | Temp 98.5°F | Resp 19

## 2021-06-21 DIAGNOSIS — Z95828 Presence of other vascular implants and grafts: Secondary | ICD-10-CM

## 2021-06-21 DIAGNOSIS — Z5111 Encounter for antineoplastic chemotherapy: Secondary | ICD-10-CM | POA: Diagnosis not present

## 2021-06-21 MED ORDER — SODIUM CHLORIDE 0.9% FLUSH
10.0000 mL | Freq: Once | INTRAVENOUS | Status: AC
  Administered 2021-06-21: 10 mL

## 2021-06-21 MED ORDER — HEPARIN SOD (PORK) LOCK FLUSH 100 UNIT/ML IV SOLN
500.0000 [IU] | Freq: Once | INTRAVENOUS | Status: AC
  Administered 2021-06-21: 500 [IU]

## 2021-06-23 ENCOUNTER — Other Ambulatory Visit: Payer: Self-pay

## 2021-06-23 ENCOUNTER — Other Ambulatory Visit: Payer: Self-pay | Admitting: Hematology and Oncology

## 2021-06-23 MED ORDER — OXYCODONE HCL 5 MG PO TABS
5.0000 mg | ORAL_TABLET | Freq: Four times a day (QID) | ORAL | 0 refills | Status: DC | PRN
  Filled 2021-06-23: qty 60, 15d supply, fill #0

## 2021-06-24 ENCOUNTER — Other Ambulatory Visit: Payer: Self-pay | Admitting: Hematology and Oncology

## 2021-06-24 ENCOUNTER — Telehealth: Payer: Self-pay | Admitting: Hematology and Oncology

## 2021-06-24 ENCOUNTER — Encounter: Payer: Self-pay | Admitting: Hematology and Oncology

## 2021-06-24 ENCOUNTER — Other Ambulatory Visit (HOSPITAL_COMMUNITY): Payer: Self-pay

## 2021-06-24 MED ORDER — OXYCODONE HCL 5 MG PO TABS
5.0000 mg | ORAL_TABLET | Freq: Four times a day (QID) | ORAL | 0 refills | Status: DC | PRN
  Filled 2021-06-24: qty 60, 15d supply, fill #0

## 2021-06-24 NOTE — Telephone Encounter (Signed)
Scheduled per los, patient has been called and notified of all upcoming appointments. 

## 2021-06-30 ENCOUNTER — Other Ambulatory Visit (HOSPITAL_BASED_OUTPATIENT_CLINIC_OR_DEPARTMENT_OTHER): Payer: Self-pay

## 2021-07-02 ENCOUNTER — Inpatient Hospital Stay (HOSPITAL_BASED_OUTPATIENT_CLINIC_OR_DEPARTMENT_OTHER): Payer: Medicaid Other | Admitting: Hematology and Oncology

## 2021-07-02 ENCOUNTER — Inpatient Hospital Stay: Payer: Medicaid Other | Attending: Hematology and Oncology

## 2021-07-02 ENCOUNTER — Inpatient Hospital Stay: Payer: Medicaid Other

## 2021-07-02 ENCOUNTER — Other Ambulatory Visit: Payer: Self-pay

## 2021-07-02 ENCOUNTER — Other Ambulatory Visit: Payer: Self-pay | Admitting: Hematology and Oncology

## 2021-07-02 VITALS — BP 134/83 | HR 73 | Temp 97.5°F | Resp 17 | Wt 144.2 lb

## 2021-07-02 DIAGNOSIS — D6481 Anemia due to antineoplastic chemotherapy: Secondary | ICD-10-CM | POA: Diagnosis not present

## 2021-07-02 DIAGNOSIS — D6959 Other secondary thrombocytopenia: Secondary | ICD-10-CM | POA: Diagnosis not present

## 2021-07-02 DIAGNOSIS — Z95828 Presence of other vascular implants and grafts: Secondary | ICD-10-CM

## 2021-07-02 DIAGNOSIS — K8689 Other specified diseases of pancreas: Secondary | ICD-10-CM

## 2021-07-02 DIAGNOSIS — Z79899 Other long term (current) drug therapy: Secondary | ICD-10-CM | POA: Insufficient documentation

## 2021-07-02 DIAGNOSIS — C250 Malignant neoplasm of head of pancreas: Secondary | ICD-10-CM

## 2021-07-02 DIAGNOSIS — K3184 Gastroparesis: Secondary | ICD-10-CM | POA: Diagnosis not present

## 2021-07-02 DIAGNOSIS — Z5111 Encounter for antineoplastic chemotherapy: Secondary | ICD-10-CM | POA: Insufficient documentation

## 2021-07-02 DIAGNOSIS — F17210 Nicotine dependence, cigarettes, uncomplicated: Secondary | ICD-10-CM | POA: Insufficient documentation

## 2021-07-02 DIAGNOSIS — T451X5A Adverse effect of antineoplastic and immunosuppressive drugs, initial encounter: Secondary | ICD-10-CM | POA: Insufficient documentation

## 2021-07-02 LAB — CBC WITH DIFFERENTIAL (CANCER CENTER ONLY)
Abs Immature Granulocytes: 0.01 10*3/uL (ref 0.00–0.07)
Basophils Absolute: 0 10*3/uL (ref 0.0–0.1)
Basophils Relative: 1 %
Eosinophils Absolute: 0.3 10*3/uL (ref 0.0–0.5)
Eosinophils Relative: 4 %
HCT: 36.4 % — ABNORMAL LOW (ref 39.0–52.0)
Hemoglobin: 12.2 g/dL — ABNORMAL LOW (ref 13.0–17.0)
Immature Granulocytes: 0 %
Lymphocytes Relative: 26 %
Lymphs Abs: 1.8 10*3/uL (ref 0.7–4.0)
MCH: 29.7 pg (ref 26.0–34.0)
MCHC: 33.5 g/dL (ref 30.0–36.0)
MCV: 88.6 fL (ref 80.0–100.0)
Monocytes Absolute: 1.1 10*3/uL — ABNORMAL HIGH (ref 0.1–1.0)
Monocytes Relative: 16 %
Neutro Abs: 3.7 10*3/uL (ref 1.7–7.7)
Neutrophils Relative %: 53 %
Platelet Count: 150 10*3/uL (ref 150–400)
RBC: 4.11 MIL/uL — ABNORMAL LOW (ref 4.22–5.81)
RDW: 17 % — ABNORMAL HIGH (ref 11.5–15.5)
WBC Count: 7 10*3/uL (ref 4.0–10.5)
nRBC: 0 % (ref 0.0–0.2)

## 2021-07-02 LAB — CMP (CANCER CENTER ONLY)
ALT: 14 U/L (ref 0–44)
AST: 19 U/L (ref 15–41)
Albumin: 3.6 g/dL (ref 3.5–5.0)
Alkaline Phosphatase: 89 U/L (ref 38–126)
Anion gap: 7 (ref 5–15)
BUN: 11 mg/dL (ref 6–20)
CO2: 23 mmol/L (ref 22–32)
Calcium: 8.8 mg/dL — ABNORMAL LOW (ref 8.9–10.3)
Chloride: 106 mmol/L (ref 98–111)
Creatinine: 0.66 mg/dL (ref 0.61–1.24)
GFR, Estimated: >60 mL/min (ref >60)
Glucose, Bld: 307 mg/dL — ABNORMAL HIGH (ref 70–99)
Potassium: 3.9 mmol/L (ref 3.5–5.1)
Sodium: 136 mmol/L (ref 135–145)
Total Bilirubin: 0.4 mg/dL (ref 0.3–1.2)
Total Protein: 7.1 g/dL (ref 6.5–8.1)

## 2021-07-02 MED ORDER — ATROPINE SULFATE 1 MG/ML IV SOLN
0.5000 mg | Freq: Once | INTRAVENOUS | Status: AC | PRN
  Administered 2021-07-02: 0.5 mg via INTRAVENOUS
  Filled 2021-07-02: qty 1

## 2021-07-02 MED ORDER — SODIUM CHLORIDE 0.9% FLUSH
10.0000 mL | Freq: Once | INTRAVENOUS | Status: AC
  Administered 2021-07-02: 10 mL

## 2021-07-02 MED ORDER — SODIUM CHLORIDE 0.9 % IV SOLN
2400.0000 mg/m2 | INTRAVENOUS | Status: DC
  Administered 2021-07-02: 4500 mg via INTRAVENOUS
  Filled 2021-07-02: qty 90

## 2021-07-02 MED ORDER — OXALIPLATIN CHEMO INJECTION 100 MG/20ML
85.0000 mg/m2 | Freq: Once | INTRAVENOUS | Status: AC
  Administered 2021-07-02: 160 mg via INTRAVENOUS
  Filled 2021-07-02: qty 32

## 2021-07-02 MED ORDER — SODIUM CHLORIDE 0.9 % IV SOLN
150.0000 mg/m2 | Freq: Once | INTRAVENOUS | Status: AC
  Administered 2021-07-02: 280 mg via INTRAVENOUS
  Filled 2021-07-02: qty 14

## 2021-07-02 MED ORDER — DEXTROSE 5 % IV SOLN: Freq: Once | INTRAVENOUS | Status: AC

## 2021-07-02 MED ORDER — SODIUM CHLORIDE 0.9 % IV SOLN
10.0000 mg | Freq: Once | INTRAVENOUS | Status: AC
  Administered 2021-07-02: 10 mg via INTRAVENOUS
  Filled 2021-07-02: qty 10

## 2021-07-02 MED ORDER — SODIUM CHLORIDE 0.9 % IV SOLN
150.0000 mg | Freq: Once | INTRAVENOUS | Status: AC
  Administered 2021-07-02: 150 mg via INTRAVENOUS
  Filled 2021-07-02: qty 150

## 2021-07-02 MED ORDER — SODIUM CHLORIDE 0.9 % IV SOLN
400.0000 mg/m2 | Freq: Once | INTRAVENOUS | Status: AC
  Administered 2021-07-02: 748 mg via INTRAVENOUS
  Filled 2021-07-02: qty 37.4

## 2021-07-02 MED ORDER — PALONOSETRON HCL INJECTION 0.25 MG/5ML
0.2500 mg | Freq: Once | INTRAVENOUS | Status: AC
  Administered 2021-07-02: 0.25 mg via INTRAVENOUS
  Filled 2021-07-02: qty 5

## 2021-07-02 NOTE — Progress Notes (Signed)
[facility] [phone]   [phone]  PROGRESS NOTE  Patient Care Team: [name], NP as PCP - General (Nurse Practitioner) Havery Moros, Carlota Raspberry, MD as Consulting Physician (Gastroenterology) Orson Slick, MD as Consulting Physician (Oncology) Royston Bake, RN as Registered Nurse  Hematological/Oncological History # Adenocarcinoma of the Pancreas, Stage IIB 02/26/2021: patient seen by GI for jaundice and pancreatic mass causing CBD obstruction. CT abdomen showed pancreatic head mass (4.4 x 3.7 cm) and enlarged portocaval lymph nodes. 02/27/2021: ERCP with placement of a nonmetallic biliary stent. FNA of pancreatic head showed malignant cells consistent with pancreatic adenocarcinoma. 03/05/2021: CT Chest showed no evidence of metastatic disease in the chest. 03/12/2021: establish care with Dr. Lorenso Courier 04/10/2021: Cycle 1 Day 1 of FOLFIRINOX 04/24/2021: Cycle 2 Day 1 of FOLFIRINOX 05/22/2021: Cycle 3 Day 1 of FOLFIRINOX. Delayed x 2 weeks due to hospitalization for poor po intake and dehydration due to gastroparesis. 06/05/2021: Cycle 4 Day 1 of FOLFIRINOX 06/19/2021: Cycle 5 Day 1 of FOLFIRINOX 07/02/2021: Cycle 6 Day 1 of FOLFIRINOX  Interval History:  [name] [name] 60 y.o. male with medical history significant for borderline resectable pancreatic cancer presents for a follow up visit. The patient's last visit was on 06/19/2021. He presents to start cycle 6 of FOLFIRINOX.  On exam today [name] reports he has been a little bit "clumsy" in the interim since his last visit.  He tries to walk 5000 steps per day but notes that he is prone to losing his balance on occasion.  He notes that his weight is stable and his appetite is good.  He does have a strong desire to eat reports he is having quite a lot of gas and has about 3-4 soft bowel movements per day.  He notes that in the past week he did have a few episodes of nausea but no vomiting.  This  pain is currently about 4 or 5 out of 10 in severity and does occasionally keep him up at night.  He does have some whitish phlegm that he produces in the morning otherwise reports that his health is at his baseline.  He currently denies any fevers, chills, sweats.  Full 10 point ROS is listed below.  He is willing and able to proceed with treatment at this time.  MEDICAL HISTORY:  Past Medical History:  Diagnosis Date   Barrett's esophagus dx 2016   Bronchitis    Chronic hepatitis C without hepatic coma ([place]) 10/31/2014   Depression    Diabetes ([place])    ED (erectile dysfunction)    GERD (gastroesophageal reflux disease)    Hepatitis C    Hiatal hernia 10/2014   3cm   Neuropathy    S/P transmetatarsal amputation of foot, left ([place]) 11/28/2020    SURGICAL HISTORY: Past Surgical History:  Procedure Laterality Date   AMPUTATION Left 11/15/2020   Procedure: LEFT TRANSMETATARSALS AMPUTATION;  Surgeon: Newt Minion, MD;  Location: [facility];  Service: Orthopedics;  Laterality: Left;   APPENDECTOMY     BILIARY STENT PLACEMENT N/A 02/27/2021   Procedure: BILIARY STENT PLACEMENT;  Surgeon: Milus Banister, MD;  Location: [facility];  Service: Endoscopy;  Laterality: N/A;   BILIARY STENT PLACEMENT N/A 03/27/2021   Procedure: BILIARY STENT PLACEMENT;  Surgeon: Jackquline Denmark, MD;  Location: [facility];  Service: Endoscopy;  Laterality: N/A;   BIOPSY  03/27/2021   Procedure: BIOPSY;  Surgeon: Jackquline Denmark, MD;  [facility];  Service:  Endoscopy;;   COLONOSCOPY N/A 02/16/2014   Procedure: COLONOSCOPY;  Surgeon: Gatha Mayer, MD;  Location: [facility];  Service: Endoscopy;  Laterality: N/A;   COLONOSCOPY     ENDOSCOPIC RETROGRADE CHOLANGIOPANCREATOGRAPHY (ERCP) WITH PROPOFOL N/A 02/27/2021   Procedure: ENDOSCOPIC RETROGRADE CHOLANGIOPANCREATOGRAPHY (ERCP) WITH PROPOFOL;  Surgeon: Milus Banister, MD;  [facility];  Service: Endoscopy;  Laterality: N/A;   ERCP N/A 03/27/2021    Procedure: ENDOSCOPIC RETROGRADE CHOLANGIOPANCREATOGRAPHY (ERCP);  Surgeon: Jackquline Denmark, MD;  Location: [facility];  Service: Endoscopy;  Laterality: N/A;   ESOPHAGOGASTRODUODENOSCOPY (EGD) WITH PROPOFOL N/A 05/06/2021   Procedure: ESOPHAGOGASTRODUODENOSCOPY (EGD) WITH PROPOFOL;  Surgeon: Mauri Pole, MD;  [facility];  Service: Endoscopy;  Laterality: N/A;   EUS N/A 02/27/2021   Procedure: UPPER ENDOSCOPIC ULTRASOUND (EUS) RADIAL;  Surgeon: Milus Banister, MD;  [facility];  Service: Endoscopy;  Laterality: N/A;   FINE NEEDLE ASPIRATION N/A 02/27/2021   Procedure: FINE NEEDLE ASPIRATION (FNA) LINEAR;  Surgeon: Milus Banister, MD;  [facility];  Service: Endoscopy;  Laterality: N/A;   IR IMAGING GUIDED PORT INSERTION  03/21/2021   SPHINCTEROTOMY  02/27/2021   Procedure: SPHINCTEROTOMY;  Surgeon: Milus Banister, MD;  [facility];  Service: Endoscopy;;   STENT REMOVAL  03/27/2021   Procedure: Lavell Islam REMOVAL;  Surgeon: Jackquline Denmark, MD;  Location[facility];  Service: Endoscopy;;   TONSILLECTOMY      SOCIAL HISTORY: Social History   Socioeconomic History   Marital status: Single    Spouse name: Not on file   Number of children: Not on file   Years of education: Not on file   Highest education level: Not on file  Occupational History   Not on file  Tobacco Use   Smoking status: Every Day    Packs/day: 0.50    Years: 35.00    Pack years: 17.50    Types: Cigarettes   Smokeless tobacco: Never  Vaping Use   Vaping Use: Never used  Substance and Sexual Activity   Alcohol use: Not Currently    Comment: previous   Drug use: No   Sexual activity: Not on file  Other Topics Concern   Not on file  Social History Narrative   Not on file   Social Determinants of Health   Financial Resource Strain: High Risk   Difficulty of Paying Living Expenses: Very hard  Food Insecurity: Food Insecurity Present   Worried About Paediatric nurse in the Last Year: Sometimes true   Arboriculturist in the Last Year: Sometimes true  Transportation Needs: Public librarian (Medical): Yes   Lack of Transportation (Non-Medical): Yes  Physical Activity: Not on file  Stress: Not on file  Social Connections: Not on file  Intimate Partner Violence: Not on file    FAMILY HISTORY: Family History  Problem Relation Age of Onset   Breast cancer Mother    Stroke Father    Alcohol abuse Father    Heart disease Maternal Grandfather    Pancreatic cancer Paternal Grandmother    Diabetes Paternal Grandfather    Colon cancer Neg Hx    Stomach cancer Neg Hx    Rectal cancer Neg Hx    Esophageal cancer Neg Hx     ALLERGIES:  is allergic to bee venom and lactose intolerance (gi).  MEDICATIONS:  Current Outpatient Medications  Medication Sig Dispense Refill   albuterol (VENTOLIN HFA) 108 (90 Base)  MCG/ACT inhaler Inhale 2 puffs into the lungs every 6 (six) hours as needed for wheezing or shortness of breath. 18 g 0   atorvastatin (LIPITOR) 20 MG tablet Take 1 tablet (20 mg total) by mouth daily. 90 tablet 0   Continuous Blood Gluc Sensor (FREESTYLE LIBRE 2 SENSOR) MISC Use as directed every 14 days. 6 each 3   diphenoxylate-atropine (LOMOTIL) 2.5-0.025 MG tablet Take 1 tablet by mouth 4 (four) times daily as needed for diarrhea or loose stools. 60 tablet 1   diphenoxylate-atropine (LOMOTIL) 2.5-0.025 MG tablet Take 1 tablet by mouth 4 (four) times daily as needed for diarrhea or loose stools. 60 tablet 0   DULoxetine (CYMBALTA) 60 MG capsule Take 1 capsule (60 mg total) by mouth daily. 90 capsule 1   EPINEPHrine 0.3 mg/0.3 mL IJ SOAJ injection Inject 0.3 mg into the muscle once as needed for anaphylaxis.     folic acid (FOLVITE) 1 MG tablet Take 1 tablet (1 mg total) by mouth in the morning.     glucose blood (TRUE METRIX BLOOD GLUCOSE TEST) test strip Use as instructed. Check blood glucose level by  fingerstick 3-4 times per day.  E11.65 200 each 12   insulin glargine (LANTUS SOLOSTAR) 100 UNIT/ML Solostar Pen Inject 10 Units into the skin 2 (two) times daily. 15 mL 0   insulin lispro (HUMALOG) 100 UNIT/ML KwikPen Inject 15 Units into the skin 3 (three) times daily with meals. 45 mL 3   Insulin Pen Needle (TECHLITE PEN NEEDLES) 32G X 4 MM MISC USE AS INSTRUCTED INTO THE SKIN TWICE DAILY 100 each 11   Insulin Pen Needle (TRUEPLUS PEN NEEDLES) 32G X 4 MM MISC Use as instructed. Inject into the skin twice daily 200 each 6   lidocaine-prilocaine (EMLA) cream Apply 1 application topically as directed as needed. (Patient taking differently: Apply 1 application topically once as needed (prior to port access).) 30 g 0   lipase/protease/amylase (CREON) 36000 UNITS CPEP capsule Take 2 capsules (72,000 Units total) by mouth 3 (three) times daily with meals. May also take 1 capsule (36,000 Units total) as needed (with snacks). 210 capsule 3   losartan (COZAAR) 50 MG tablet Take 1 tablet (50 mg total) by mouth daily. 90 tablet 1   magic mouthwash w/lidocaine SOLN Take 5 mLs by mouth 4 (four) times daily as needed. 400 mL 0   metoCLOPramide (REGLAN) 5 MG tablet Take 1 tablet (5 mg total) by mouth 4 (four) times daily -  before meals and at bedtime. 90 tablet 1   mirtazapine (REMERON) 30 MG tablet Take 1 tablet (30 mg total) by mouth at bedtime. 90 tablet 1   Multiple Vitamins-Minerals (THEREMS-M) TABS Take 1 tablet by mouth in the morning.     nystatin-lidocaine-diphenhydrAMINE-alum & mag hydroxide-simeth-sterile water Take 5 mls by mouth four times a day as needed 400 mL 0   omeprazole (PRILOSEC) 40 MG capsule Take 1 capsule (40 mg total) by mouth in the morning.     ondansetron (ZOFRAN) 8 MG tablet Take 1 tablet (8 mg total) by mouth 2 (two) times daily as needed. Start on day 3 after chemotherapy. (Patient taking differently: Take 8 mg by mouth 2 (two) times daily as needed for nausea or vomiting. Start on  day 3 after chemotherapy.) 30 tablet 1   oxyCODONE (OXY IR/ROXICODONE) 5 MG immediate release tablet Take 1 tablet (5 mg total) by mouth every 6 (six) hours as needed for severe pain. 60 tablet 0  polyethylene glycol powder (GLYCOLAX/MIRALAX) 17 GM/SCOOP powder Take 17 g by mouth at bedtime. 238 g 0   prochlorperazine (COMPAZINE) 10 MG tablet Take 1 tablet (10 mg total) by mouth every 6 (six) hours as needed (Nausea or vomiting). 30 tablet 1   TRUEplus Lancets 28G MISC Use as instructed. Check blood glucose level by fingerstick 3-4 times per day.   E11.65 200 each 3   No current facility-administered medications for this visit.    REVIEW OF SYSTEMS:   Constitutional: ( - ) fevers, ( - )  chills , ( - ) night sweats Eyes: ( - ) blurriness of vision, ( - ) double vision, ( - ) watery eyes Ears, nose, mouth, throat, and face: ( - ) mucositis, ( - ) sore throat Respiratory: ( - ) cough, ( - ) dyspnea, ( - ) wheezes Cardiovascular: ( - ) palpitation, ( - ) chest discomfort, ( - ) lower extremity swelling Gastrointestinal:  ( - ) nausea, ( - ) heartburn, ( - ) change in bowel habits Skin: ( - ) abnormal skin rashes Lymphatics: ( - ) new lymphadenopathy, ( - ) easy bruising Neurological: ( - ) numbness, ( - ) tingling, ( - ) new weaknesses Behavioral/Psych: ( - ) mood change, ( - ) new changes  All other systems were reviewed with the patient and are negative.  PHYSICAL EXAMINATION: ECOG PERFORMANCE STATUS: 1 - Symptomatic but completely ambulatory  Vitals:   07/02/21 0934  BP: 134/83  Pulse: 73  Resp: 17  Temp: (!) 97.5 F (36.4 C)  SpO2: 100%      Filed Weights   07/02/21 0934  Weight: 144 lb 3.2 oz (65.4 kg)   GENERAL: Well-appearing middle-age Caucasian male, alert, no distress and comfortable SKIN: skin color, texture, turgor are normal, no rashes or significant lesions.  EYES: conjunctiva are pink and non-injected, sclera clear LUNGS: clear to auscultation and percussion  with normal breathing effort HEART: regular rate & rhythm and no murmurs and no lower extremity edema PSYCH: alert & oriented x 3, fluent speech NEURO: no focal motor/sensory deficits  LABORATORY DATA:  I have reviewed the data as listed CBC Latest Ref Rng & Units 07/02/2021 06/19/2021 06/04/2021  WBC 4.0 - 10.5 K/uL 7.0 6.5 6.9  Hemoglobin 13.0 - 17.0 g/dL 12.2(L) 10.5(L) 11.8(L)  Hematocrit 39.0 - 52.0 % 36.4(L) 32.3(L) 35.1(L)  Platelets 150 - 400 K/uL 150 153 262    CMP Latest Ref Rng & Units 06/19/2021 06/04/2021 05/22/2021  Glucose 70 - 99 mg/dL 190(H) 199(H) 462(H)  BUN 6 - 20 mg/dL 9 7 17   Creatinine 0.61 - 1.24 mg/dL 0.62 0.62 0.78  Sodium 135 - 145 mmol/L 141 141 131(L)  Potassium 3.5 - 5.1 mmol/L 4.0 3.3(L) 4.5  Chloride 98 - 111 mmol/L 108 106 93(L)  CO2 22 - 32 mmol/L 26 24 26   Calcium 8.9 - 10.3 mg/dL 9.1 9.3 10.0  Total Protein 6.5 - 8.1 g/dL 6.9 7.6 7.8  Total Bilirubin 0.3 - 1.2 mg/dL 0.3 0.4 0.7  Alkaline Phos 38 - 126 U/L 80 107 120  AST 15 - 41 U/L 23 27 22   ALT 0 - 44 U/L 13 15 15     RADIOGRAPHIC STUDIES: I have personally reviewed the radiological images as listed and agreed with the findings in the report: Borderline resectable pancreatic mass with no evidence of metastatic disease. No results found.  ASSESSMENT & PLAN [name] [name] 60 y.o. male with medical history significant for borderline  resectable pancreatic cancer presents for a follow up visit.   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  The patient does still have an elevation in bilirubin which likely represents a decline from the time of the latest stent placement.  He was evaluated by surgery who determined this is a borderline resectable tumor.  We will plan to start the patient on neoadjuvant chemotherapy.  The 2 options would be Gem/Abraxane or FOLFIRINOX.  Given  his excellent functional status I do believe he would be a good candidate for FOLFIRINOX.   # Adenocarcinoma of the Pancreas, Stage IIB -- At this time disease appears borderline resectable.  We will proceed with neoadjuvant chemotherapy.  At this time would prefer a regimen of FOLFIRINOX given his good baseline health. -- case reviewed by surgery, who agrees his disease is borderline resectable.  -- baseline elevations in both CA 19-9 and CEA. Continue to monitor during treatment.  --Staging scans complete.  No need for further imaging at this time. Will plan for repeat imaging in approximately 8 cycles to reassess --Reassuringly during his admission for intractable nausea and vomiting a CT scan of the abdomen showed marked response in the tumor to the chemotherapy treatment. -- Cycle 1 Day 1 of FOLFIRINOX started 04/10/2021 Plan:  --proceed with Cycle 6 Day 1 of treatment today  --will touch base with surgery regarding timing of their next visit and preferred imaging modality.  -- RTC in 2 weeks for Cycle 7 of treatment  #Gastroparesis #Nausea/Vomiting/Poor PO Intake --patient advanced diet to solid foods.  --nausea meds as below. --continue to work on glycemic control --continue to monitor.   #Chemotherapy induced Anemia --Hgb 12.2 today. Plt 150 and WBC 7.0 --ok to proceed with treatment --continue to monitor   # Pain Control -- Patient currently taking ibuprofen for his abdominal pain --continue oxycodone 5 to 10 mg every 6 hours as needed   #Supportive Care -- chemotherapy education complete -- port placed -- zofran 8mg  q8H PRN and compazine 10mg  PO q6H for nausea --trial of Creon called into pharmacy to help with loose stools. -- EMLA cream for port  No orders of the defined types were placed in this encounter.  All questions were answered. The patient knows to call the clinic with any problems, questions or concerns.  A total of more than 30 minutes were spent on this  encounter with face-to-face time and non-face-to-face time, including preparing to see the patient, ordering tests and/or medications, counseling the patient and coordination of care as outlined above.   Ledell Peoples, MD Department of Hematology/Oncology [facility] Phone: [phone] Pager: [phone] Email: [email or web address]  07/02/2021 9:58 AM

## 2021-07-02 NOTE — Patient Instructions (Addendum)
[facility] ONCOL[facility]   Discharge Instructions: Thank you for choosing [facility] to provide your oncology and hematology care.   If you have a lab appointment with the [facility], please go directly to the [facility] and check in at the registration area.   Wear comfortable clothing and clothing appropriate for easy access to any Portacath or PICC line.   We strive to give you quality time with your provider. You may need to reschedule your appointment if you arrive late (15 or more minutes).  Arriving late affects you and other patients whose appointments are after yours.  Also, if you miss three or more appointments without notifying the office, you may be dismissed from the clinic at the provider's discretion.      For prescription refill requests, have your pharmacy contact our office and allow 72 hours for refills to be completed.    Today you received the following chemotherapy and/or immunotherapy agents: Oxaliplatin, Leucovorin, Irinotecan, and Fluorouracil (Adrucil)      To help prevent nausea and vomiting after your treatment, we encourage you to take your nausea medication as directed.  BELOW ARE SYMPTOMS THAT SHOULD BE REPORTED IMMEDIATELY: *FEVER GREATER THAN 100.4 F (38 C) OR HIGHER *CHILLS OR SWEATING *NAUSEA AND VOMITING THAT IS NOT CONTROLLED WITH YOUR NAUSEA MEDICATION *UNUSUAL SHORTNESS OF BREATH *UNUSUAL BRUISING OR BLEEDING *URINARY PROBLEMS (pain or burning when urinating, or frequent urination) *BOWEL PROBLEMS (unusual diarrhea, constipation, pain near the anus) TENDERNESS IN MOUTH AND THROAT WITH OR WITHOUT PRESENCE OF ULCERS (sore throat, sores in mouth, or a toothache) UNUSUAL RASH, SWELLING OR PAIN  UNUSUAL VAGINAL DISCHARGE OR ITCHING   Items with * indicate a potential emergency and should be followed up as soon as possible or go to the Emergency Department if any problems should occur.  Please show the  CHEMOTHERAPY ALERT CARD or IMMUNOTHERAPY ALERT CARD at check-in to the Emergency Department and triage nurse.  Should you have questions after your visit or need to cancel or reschedule your appointment, please contact [facility]  Dept: [phone]  and follow the prompts.  Office hours are 8:00 a.m. to 4:30 p.m. Monday - Friday. Please note that voicemails left after 4:00 p.m. may not be returned until the following business day.  We are closed weekends and major holidays. You have access to a nurse at all times for urgent questions. Please call the main number to the clinic Dept: [phone] and follow the prompts.   For any non-urgent questions, you may also contact your provider using MyChart. We now offer e-Visits for anyone 67 and older to request care online for non-urgent symptoms. For details visit [email or web address].   Also download the MyChart app! Go to the app store, search "MyChart", open the app, select [facility], and log in with your MyChart username and password.  Due to Covid, a mask is required upon entering the hospital/clinic. If you do not have a mask, one will be given to you upon arrival. For doctor visits, patients may have 1 support person aged 49 or older with them. For treatment visits, patients cannot have anyone with them due to current Covid guidelines and our immunocompromised population.   The chemotherapy medication bag should finish at 46 hours, 96 hours, or 7 days. For example, if your pump is scheduled for 46 hours and it was put on at 4:00 p.m., it should finish at 2:00 p.m. the day it is scheduled  to come off regardless of your appointment time.     Estimated time to finish at 11/4 @ 1:35 PM.   If the display on your pump reads "Low Volume" and it is beeping, take the batteries out of the pump and come to the cancer center for it to be taken off.   If the pump alarms go off prior to the pump reading "Low Volume" then call  [phone] and someone can assist you.  If the plunger comes out and the chemotherapy medication is leaking out, please use your home chemo spill kit to clean up the spill. Do NOT use paper towels or other household products.  If you have problems or questions regarding your pump, please call either [phone] (24 hours a day) or the cancer center Monday-Friday 8:00 a.m.- 4:30 p.m. at the clinic number and we will assist you. If you are unable to get assistance, then go to the nearest Emergency Department and ask the staff to contact the IV team for assistance.

## 2021-07-03 ENCOUNTER — Other Ambulatory Visit: Payer: Self-pay | Admitting: Nurse Practitioner

## 2021-07-03 ENCOUNTER — Other Ambulatory Visit: Payer: Self-pay | Admitting: Internal Medicine

## 2021-07-03 DIAGNOSIS — F10982 Alcohol use, unspecified with alcohol-induced sleep disorder: Secondary | ICD-10-CM

## 2021-07-04 ENCOUNTER — Encounter: Payer: Self-pay | Admitting: Hematology and Oncology

## 2021-07-04 ENCOUNTER — Other Ambulatory Visit: Payer: Self-pay | Admitting: Physician Assistant

## 2021-07-04 ENCOUNTER — Other Ambulatory Visit: Payer: Self-pay

## 2021-07-04 ENCOUNTER — Other Ambulatory Visit (HOSPITAL_COMMUNITY): Payer: Self-pay

## 2021-07-04 ENCOUNTER — Inpatient Hospital Stay: Payer: Medicaid Other

## 2021-07-04 ENCOUNTER — Other Ambulatory Visit (HOSPITAL_BASED_OUTPATIENT_CLINIC_OR_DEPARTMENT_OTHER): Payer: Self-pay

## 2021-07-04 ENCOUNTER — Other Ambulatory Visit: Payer: Self-pay | Admitting: Pharmacist

## 2021-07-04 VITALS — BP 127/66 | HR 79 | Temp 98.9°F | Resp 16

## 2021-07-04 DIAGNOSIS — Z95828 Presence of other vascular implants and grafts: Secondary | ICD-10-CM

## 2021-07-04 DIAGNOSIS — Z5111 Encounter for antineoplastic chemotherapy: Secondary | ICD-10-CM | POA: Diagnosis not present

## 2021-07-04 MED ORDER — ACCU-CHEK GUIDE VI STRP
ORAL_STRIP | 12 refills | Status: DC
  Filled 2021-07-04: qty 100, fill #0
  Filled 2021-07-04: qty 100, 25d supply, fill #0

## 2021-07-04 MED ORDER — ACCU-CHEK GUIDE VI STRP
ORAL_STRIP | 12 refills | Status: DC
  Filled 2021-07-04: qty 100, 25d supply, fill #0
  Filled 2021-07-04: qty 100, fill #0

## 2021-07-04 MED ORDER — ACCU-CHEK GUIDE W/DEVICE KIT
PACK | 0 refills | Status: DC
  Filled 2021-07-04: qty 1, 30d supply, fill #0
  Filled 2021-07-04: qty 1, fill #0

## 2021-07-04 MED ORDER — ACCU-CHEK SOFTCLIX LANCETS MISC
3 refills | Status: DC
  Filled 2021-07-04: qty 100, 25d supply, fill #0
  Filled 2021-07-04: qty 100, fill #0
  Filled 2021-08-17: qty 100, 25d supply, fill #1
  Filled 2022-02-09: qty 100, 25d supply, fill #0
  Filled 2022-05-07: qty 100, 25d supply, fill #1

## 2021-07-04 MED ORDER — ACCU-CHEK GUIDE VI STRP
ORAL_STRIP | 12 refills | Status: DC
  Filled 2021-07-04: qty 100, 25d supply, fill #0
  Filled 2021-07-04: qty 100, fill #0
  Filled 2021-08-17: qty 100, 25d supply, fill #1
  Filled 2021-09-10 (×2): qty 100, 25d supply, fill #0
  Filled 2021-10-16: qty 100, 25d supply, fill #1
  Filled 2021-10-20 – 2021-12-06 (×3): qty 100, 25d supply, fill #2
  Filled 2022-02-09: qty 100, 25d supply, fill #3
  Filled 2022-03-11: qty 100, 25d supply, fill #4
  Filled 2022-03-23 – 2022-04-29 (×2): qty 100, 25d supply, fill #5

## 2021-07-04 MED ORDER — ACCU-CHEK GUIDE W/DEVICE KIT
PACK | 0 refills | Status: DC
  Filled 2021-07-04 (×2): qty 1, 30d supply, fill #0

## 2021-07-04 MED ORDER — MAGIC MOUTHWASH W/LIDOCAINE
5.0000 mL | Freq: Four times a day (QID) | ORAL | 1 refills | Status: DC | PRN
  Filled 2021-07-04: qty 280, 14d supply, fill #0

## 2021-07-04 MED ORDER — MIRTAZAPINE 30 MG PO TABS
30.0000 mg | ORAL_TABLET | Freq: Every day | ORAL | 1 refills | Status: DC
  Filled 2021-07-04 – 2021-11-07 (×3): qty 90, 90d supply, fill #0

## 2021-07-04 MED ORDER — THEREMS-M PO TABS
1.0000 | ORAL_TABLET | Freq: Every morning | ORAL | 3 refills | Status: DC
  Filled 2021-07-04 – 2021-08-11 (×2): qty 90, fill #0
  Filled 2022-02-09: qty 90, 90d supply, fill #0
  Filled 2022-02-16: qty 130, 130d supply, fill #0
  Filled 2022-03-23 – 2022-04-15 (×2): qty 130, 130d supply, fill #1
  Filled 2022-05-25: qty 100, 100d supply, fill #2
  Filled 2022-06-29: qty 130, 130d supply, fill #2

## 2021-07-04 MED ORDER — HEPARIN SOD (PORK) LOCK FLUSH 100 UNIT/ML IV SOLN
500.0000 [IU] | Freq: Once | INTRAVENOUS | Status: AC
  Administered 2021-07-04: 500 [IU]

## 2021-07-04 MED ORDER — NYSTATIN 100000 UNIT/ML MT SUSP
OROMUCOSAL | 1 refills | Status: DC
  Filled 2021-07-04: qty 280, 14d supply, fill #0
  Filled 2021-07-17: qty 280, 14d supply, fill #1

## 2021-07-04 MED ORDER — SODIUM CHLORIDE 0.9% FLUSH
10.0000 mL | Freq: Once | INTRAVENOUS | Status: AC
  Administered 2021-07-04: 10 mL

## 2021-07-04 NOTE — Telephone Encounter (Signed)
Requested medications are due for refill today NO, filled 90 days worth 06/19/21  Requested medications are on the active medication list yes  Last refill 06/19/21  Last visit 01/07/21, seen in Aug but not addressed  Future visit scheduled 07/08/21  Notes to clinic requesting early, please assess. Requested Prescriptions  Pending Prescriptions Disp Refills   mirtazapine (REMERON) 30 MG tablet 90 tablet 1    Sig: Take 1 tablet (30 mg total) by mouth at bedtime.     Psychiatry: Antidepressants - mirtazapine Passed - 07/03/2021  5:14 PM      Passed - AST in normal range and within 360 days    AST  Date Value Ref Range Status  07/02/2021 19 15 - 41 U/L Final          Passed - ALT in normal range and within 360 days    ALT  Date Value Ref Range Status  07/02/2021 14 0 - 44 U/L Final          Passed - Triglycerides in normal range and within 360 days    Triglycerides  Date Value Ref Range Status  04/28/2021 78 0 - 149 mg/dL Final          Passed - Total Cholesterol in normal range and within 360 days    Cholesterol, Total  Date Value Ref Range Status  04/28/2021 174 100 - 199 mg/dL Final          Passed - WBC in normal range and within 360 days    WBC  Date Value Ref Range Status  05/14/2021 11.9 (H) 4.0 - 10.5 K/uL Final   WBC Count  Date Value Ref Range Status  07/02/2021 7.0 4.0 - 10.5 K/uL Final          Passed - Completed PHQ-2 or PHQ-9 in the last 360 days      Passed - Valid encounter within last 6 months    Recent Outpatient Visits           4 weeks ago Dislocation of finger, subsequent encounter   [facility], Vernia Buff, NP   2 months ago Hospital discharge follow-up   [place], Zelda W, NP   5 months ago Loose stools   [facility], Vernia Buff, NP   6 months ago Generalized abdominal pain   [facility],  Michelle P, NP   8 months ago Hospital discharge follow-up   [facility], Zelda W, NP       Future Appointments             In 4 days [name], NP [facility]

## 2021-07-04 NOTE — Progress Notes (Signed)
Called in order for Magic Mouthwash to [facility].  Spoke with Aaron Edelman in pharmacy.  Called pt to confirm pharmacy pick up location.

## 2021-07-05 ENCOUNTER — Other Ambulatory Visit (HOSPITAL_COMMUNITY): Payer: Self-pay

## 2021-07-07 ENCOUNTER — Other Ambulatory Visit: Payer: Self-pay

## 2021-07-07 ENCOUNTER — Encounter: Payer: Self-pay | Admitting: Nurse Practitioner

## 2021-07-08 ENCOUNTER — Other Ambulatory Visit (HOSPITAL_COMMUNITY): Payer: Self-pay

## 2021-07-08 ENCOUNTER — Encounter: Payer: Self-pay | Admitting: Hematology and Oncology

## 2021-07-08 ENCOUNTER — Other Ambulatory Visit: Payer: Self-pay | Admitting: Hematology and Oncology

## 2021-07-08 ENCOUNTER — Encounter: Payer: Self-pay | Admitting: Nurse Practitioner

## 2021-07-08 ENCOUNTER — Telehealth (HOSPITAL_BASED_OUTPATIENT_CLINIC_OR_DEPARTMENT_OTHER): Payer: Medicaid Other | Admitting: Nurse Practitioner

## 2021-07-08 ENCOUNTER — Other Ambulatory Visit: Payer: Self-pay

## 2021-07-08 DIAGNOSIS — K219 Gastro-esophageal reflux disease without esophagitis: Secondary | ICD-10-CM

## 2021-07-08 DIAGNOSIS — C250 Malignant neoplasm of head of pancreas: Secondary | ICD-10-CM

## 2021-07-08 DIAGNOSIS — K2210 Ulcer of esophagus without bleeding: Secondary | ICD-10-CM

## 2021-07-08 DIAGNOSIS — K3184 Gastroparesis: Secondary | ICD-10-CM

## 2021-07-08 MED ORDER — PANTOPRAZOLE SODIUM 40 MG PO TBEC
40.0000 mg | DELAYED_RELEASE_TABLET | Freq: Every day | ORAL | 1 refills | Status: DC
Start: 2021-07-08 — End: 2021-11-30
  Filled 2021-07-08: qty 90, 90d supply, fill #0
  Filled 2021-08-17: qty 90, 90d supply, fill #1
  Filled 2021-09-10 – 2021-10-02 (×2): qty 90, 90d supply, fill #0

## 2021-07-08 NOTE — Progress Notes (Signed)
Virtual Visit Note Due to national recommendations of social distancing due to [place] 19, virtual visit is felt to be most appropriate for this patient at this time.  I discussed the limitations, risks, security and privacy concerns of performing an evaluation and management service by video and the availability of in person appointments. I also discussed with the patient that there may be a patient responsible charge related to this service. The patient expressed understanding and agreed to proceed.    I connected with [name] [name] on 07/08/21  at   8:30 AM EST  EDT by VIDEO and verified that I am speaking with the correct person using two identifiers.   Location of Patient: Private Residence   Location of Provider: [place] participating in VIRTUAL visit: [name]    History of Present Illness: VIRTUAL visit for: GERD  Omeprazole reported as ineffective. Will discontinue and try pantoprazole at this time. He has a history of barrett's esophagus, gastroparesis and GERD. Symptoms include  He is also currently receiving chemotherapy for pancreatic cancer.   Reports painful oral lesions. Needs to pick up magic mouthwash from pharmacy .    Mother is currently in the hospital. Not doing well. She has colon cancer and is now septic.    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    Past Surgical History:  Procedure Laterality Date   AMPUTATION Left 11/15/2020   Procedure: LEFT TRANSMETATARSALS AMPUTATION;  Surgeon: Newt Minion, MD;  Location: [facility];  Service: Orthopedics;   Laterality: Left;   APPENDECTOMY     BILIARY STENT PLACEMENT N/A 02/27/2021   Procedure: BILIARY STENT PLACEMENT;  Surgeon: Milus Banister, MD;  [facility];  Service: Endoscopy;  Laterality: N/A;   BILIARY STENT PLACEMENT N/A 03/27/2021   Procedure: BILIARY STENT PLACEMENT;  Surgeon: Jackquline Denmark, MD;  [facility];  Service: Endoscopy;  Laterality: N/A;   BIOPSY  03/27/2021   Procedure: BIOPSY;  Surgeon: Jackquline Denmark, MD;  [facility];  Service: Endoscopy;;   COLONOSCOPY N/A 02/16/2014   Procedure: COLONOSCOPY;  Surgeon: Gatha Mayer, MD;  [facility];  Service: Endoscopy;  Laterality: N/A;   COLONOSCOPY     ENDOSCOPIC RETROGRADE CHOLANGIOPANCREATOGRAPHY (ERCP) WITH PROPOFOL N/A 02/27/2021   Procedure: ENDOSCOPIC RETROGRADE CHOLANGIOPANCREATOGRAPHY (ERCP) WITH PROPOFOL;  Surgeon: Milus Banister, MD;  [facility];  Service: Endoscopy;  Laterality: N/A;   ERCP N/A 03/27/2021   Procedure: ENDOSCOPIC RETROGRADE CHOLANGIOPANCREATOGRAPHY (ERCP);  Surgeon: Jackquline Denmark, MD;  [facility];  Service: Endoscopy;  Laterality: N/A;   ESOPHAGOGASTRODUODENOSCOPY (EGD) WITH PROPOFOL N/A 05/06/2021   Procedure: ESOPHAGOGASTRODUODENOSCOPY (EGD) WITH PROPOFOL;  Surgeon: Mauri Pole, MD;  [facility];  Service: Endoscopy;  Laterality: N/A;   EUS N/A 02/27/2021   Procedure: UPPER ENDOSCOPIC ULTRASOUND (EUS) RADIAL;  Surgeon: Milus Banister, MD;  [facility];  Service: Endoscopy;  Laterality: N/A;   FINE NEEDLE ASPIRATION N/A 02/27/2021   Procedure: FINE NEEDLE ASPIRATION (FNA) LINEAR;  Surgeon: Milus Banister, MD;  [facility];  Service: Endoscopy;  Laterality: N/A;   IR IMAGING GUIDED PORT INSERTION  03/21/2021   SPHINCTEROTOMY  02/27/2021   Procedure: SPHINCTEROTOMY;  Surgeon: Milus Banister, MD;  [facility];  Service: Endoscopy;;   STENT REMOVAL  03/27/2021   Procedure: Lavell Islam REMOVAL;   Surgeon: Jackquline Denmark, MD;  [facility];  Service: Endoscopy;;   TONSILLECTOMY      Family History  Problem Relation Age of Onset   Breast cancer Mother    Stroke Father    Alcohol abuse Father    Heart disease Maternal Grandfather    Pancreatic cancer Paternal Grandmother    Diabetes Paternal Grandfather    Colon cancer Neg Hx    Stomach cancer Neg Hx    Rectal cancer Neg Hx    Esophageal cancer Neg Hx     Social History   Socioeconomic History   Marital status: Single    Spouse name: Not on file   Number of children: Not on file   Years of education: Not on file   Highest education level: Not on file  Occupational History   Not on file  Tobacco Use   Smoking status: Every Day    Packs/day: 0.50    Years: 35.00    Pack years: 17.50    Types: Cigarettes   Smokeless tobacco: Never  Vaping Use   Vaping Use: Never used  Substance and Sexual Activity   Alcohol use: Not Currently    Comment: previous   Drug use: No   Sexual activity: Not on file  Other Topics Concern   Not on file  Social History Narrative   Not on file   Social Determinants of Health   Financial Resource Strain: High Risk   Difficulty of Paying Living Expenses: Very hard  Food Insecurity: Food Insecurity Present   Worried About Charity fundraiser in the Last Year: Sometimes true   Ran Out of Food in the Last Year: Sometimes true  Transportation Needs: Unmet Transportation Needs   Lack of Transportation (Medical): Yes   Lack of Transportation (Non-Medical): Yes  Physical Activity: Not on file  Stress: Not on file  Social Connections: Not on file     Observations/Objective: Awake, alert and oriented x 3   Review of Systems  Constitutional:  Negative for fever, malaise/fatigue and weight loss.  HENT:  Negative for nosebleeds.        Oral lesions  Eyes: Negative.  Negative for blurred vision, double vision and photophobia.  Respiratory: Negative.  Negative for cough and  shortness of breath.   Cardiovascular: Negative.  Negative for chest pain, palpitations and leg swelling.  Gastrointestinal:  Positive for heartburn. Negative for abdominal pain, blood in stool, constipation, melena, nausea and vomiting.  Musculoskeletal: Negative.  Negative for myalgias.  Neurological: Negative.  Negative for dizziness, focal weakness, seizures and headaches.  Psychiatric/Behavioral: Negative.  Negative for suicidal ideas.    Assessment and Plan: Diagnoses and all orders for this visit:  GERD without esophagitis -     pantoprazole (PROTONIX) 40 MG tablet; Take 1 tablet (40 mg total) by mouth daily.  Gastroparesis -     pantoprazole (PROTONIX) 40 MG tablet; Take 1 tablet (40 mg total) by mouth daily.  Barrett's esophageal ulceration -     pantoprazole (PROTONIX) 40 MG tablet; Take 1 tablet (40 mg total) by mouth daily.    Follow Up Instructions Return in about 3 months (around 10/08/2021) for GERD.  I discussed the assessment and treatment plan with the patient. The patient was provided an opportunity to ask questions and all were answered. The patient agreed with the plan and demonstrated an understanding of the instructions.   The patient was advised to call back or seek an in-person evaluation if the symptoms worsen or if the condition fails to improve as anticipated.  I provided 10 minutes of face-to-face time during this encounter including median intraservice time, reviewing previous notes, labs, imaging, medications and explaining diagnosis and management.  [name], FNP-BC

## 2021-07-09 ENCOUNTER — Ambulatory Visit: Payer: Medicaid Other | Admitting: Endocrinology

## 2021-07-09 ENCOUNTER — Other Ambulatory Visit: Payer: Self-pay

## 2021-07-09 VITALS — BP 148/86 | HR 101 | Ht 70.0 in | Wt 140.2 lb

## 2021-07-09 DIAGNOSIS — E1140 Type 2 diabetes mellitus with diabetic neuropathy, unspecified: Secondary | ICD-10-CM

## 2021-07-09 DIAGNOSIS — Z794 Long term (current) use of insulin: Secondary | ICD-10-CM | POA: Diagnosis not present

## 2021-07-09 LAB — POCT GLYCOSYLATED HEMOGLOBIN (HGB A1C): Hemoglobin A1C: 10.3 % — AB (ref 4.0–5.6)

## 2021-07-09 MED ORDER — HUMULIN N KWIKPEN 100 UNIT/ML ~~LOC~~ SUPN
30.0000 [IU] | PEN_INJECTOR | SUBCUTANEOUS | 3 refills | Status: DC
  Filled 2021-07-09: qty 9, 30d supply, fill #0
  Filled 2021-07-09 (×2): qty 30, 100d supply, fill #0
  Filled 2021-07-10: qty 9, 30d supply, fill #0
  Filled 2021-07-10: qty 15, 50d supply, fill #0
  Filled 2021-07-14 – 2021-07-17 (×3): qty 9, 30d supply, fill #0
  Filled 2021-07-18: qty 30, 100d supply, fill #0
  Filled 2021-07-18: qty 15, 50d supply, fill #0
  Filled 2021-07-18: qty 30, 100d supply, fill #0
  Filled 2021-07-20: qty 27, 90d supply, fill #0

## 2021-07-09 NOTE — Patient Instructions (Addendum)
check your blood sugar twice a day.  vary the time of day when you check, between before the 3 meals, and at bedtime.  also check if you have symptoms of your blood sugar being too high or too low.  please keep a record of the readings and bring it to your next appointment here (or you can bring the meter itself).  You can write it on any piece of paper.  please call us sooner if your blood sugar goes below 70, or if most of your readings are over 200.   I have sent a prescription to your pharmacy, to change both insulins to NPH, 30 units each morning. We are placing a continuous glucose monitor sensor today.   Please come back for a follow-up appointment on 07/21/21, at 4:30 PM.

## 2021-07-09 NOTE — Progress Notes (Signed)
Subjective:    Patient ID: [name] [name], male    DOB: [birth year], 60 y.o.   MRN: [id number]  HPI Pt returns for f/u of diabetes mellitus: DM type: Insulin-requiring type 2 Dx'ed: 4827 Complications: left TMA and GP. Therapy: insulin since 2021 DKA: never Severe hypoglycemia: never Pancreatitis: never Pancreatic imaging (2022): large tumor in the head.   SDOH:  Other: stage 2B pancreatic cancer; He says chemo does not affect cbg. Interval history: Medicaid declined continuous glucose monitor.  He reports mild fasting hypoglycemia approx 2/week.  However, this can also happen after a meal.  He takes humalog approx BID, as he eats just 2 meals/day.  He brings his meter with his cbg's which I have reviewed today.  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    Past Surgical History:  Procedure Laterality Date   AMPUTATION Left 11/15/2020   Procedure: LEFT TRANSMETATARSALS AMPUTATION;  Surgeon: Newt Minion, MD;  Location: [facility];  Service: Orthopedics;  Laterality: Left;   APPENDECTOMY     BILIARY STENT PLACEMENT N/A 02/27/2021   Procedure: BILIARY STENT PLACEMENT;  Surgeon: Milus Banister, MD;  [facility];  Service: Endoscopy;  Laterality: N/A;   BILIARY STENT PLACEMENT N/A 03/27/2021   Procedure: BILIARY STENT PLACEMENT;  Surgeon: Jackquline Denmark, MD;  [facility];  Service: Endoscopy;  Laterality: N/A;   BIOPSY  03/27/2021   Procedure: BIOPSY;  Surgeon: Jackquline Denmark, MD;  [facility];  Service: Endoscopy;;   COLONOSCOPY N/A 02/16/2014   Procedure: COLONOSCOPY;  Surgeon: Gatha Mayer, MD;   [facility];  Service: Endoscopy;  Laterality: N/A;   COLONOSCOPY     ENDOSCOPIC RETROGRADE CHOLANGIOPANCREATOGRAPHY (ERCP) WITH PROPOFOL N/A 02/27/2021   Procedure: ENDOSCOPIC RETROGRADE CHOLANGIOPANCREATOGRAPHY (ERCP) WITH PROPOFOL;  Surgeon: Milus Banister, MD;  [facility];  Service: Endoscopy;  Laterality: N/A;   ERCP N/A 03/27/2021   Procedure: ENDOSCOPIC RETROGRADE CHOLANGIOPANCREATOGRAPHY (ERCP);  Surgeon: Jackquline Denmark, MD;  [facility];  Service: Endoscopy;  Laterality: N/A;   ESOPHAGOGASTRODUODENOSCOPY (EGD) WITH PROPOFOL N/A 05/06/2021   Procedure: ESOPHAGOGASTRODUODENOSCOPY (EGD) WITH PROPOFOL;  Surgeon: Mauri Pole, MD;  [facility];  Service: Endoscopy;  Laterality: N/A;   EUS N/A 02/27/2021   Procedure: UPPER ENDOSCOPIC ULTRASOUND (EUS) RADIAL;  Surgeon: Milus Banister, MD;  [facility];  Service: Endoscopy;  Laterality: N/A;   FINE NEEDLE ASPIRATION N/A 02/27/2021   Procedure: FINE NEEDLE ASPIRATION (FNA) LINEAR;  Surgeon: Milus Banister, MD;  [facility];  Service: Endoscopy;  Laterality: N/A;   IR IMAGING GUIDED PORT INSERTION  03/21/2021   SPHINCTEROTOMY  02/27/2021   Procedure: SPHINCTEROTOMY;  Surgeon: Milus Banister, MD;  [facility];  Service: Endoscopy;;   STENT REMOVAL  03/27/2021   Procedure: STENT REMOVAL;  Surgeon: Jackquline Denmark, MD;  [facility];  Service: Endoscopy;;   TONSILLECTOMY      Social History   Socioeconomic History   Marital status: Single    Spouse name: Not on file   Number of children: Not on file  Years of education: Not on file   Highest education level: Not on file  Occupational History   Not on file  Tobacco Use   Smoking status: Every Day    Packs/day: 0.50    Years: 35.00    Pack years: 17.50    Types: Cigarettes   Smokeless tobacco: Never  Vaping Use   Vaping Use: Never used  Substance and Sexual Activity   Alcohol use: Not  Currently    Comment: previous   Drug use: No   Sexual activity: Not on file  Other Topics Concern   Not on file  Social History Narrative   Not on file   Social Determinants of Health   Financial Resource Strain: High Risk   Difficulty of Paying Living Expenses: Very hard  Food Insecurity: Food Insecurity Present   Worried About Charity fundraiser in the Last Year: Sometimes true   Arboriculturist in the Last Year: Sometimes true  Transportation Needs: Unmet Transportation Needs   Lack of Transportation (Medical): Yes   Lack of Transportation (Non-Medical): Yes  Physical Activity: Not on file  Stress: Not on file  Social Connections: Not on file  Intimate Partner Violence: Not on file    Current Outpatient Medications on File Prior to Visit  Medication Sig Dispense Refill   Accu-Chek Softclix Lancets lancets Check blood glucose level by fingerstick 3-4 times per day. 100 each 3   albuterol (VENTOLIN HFA) 108 (90 Base) MCG/ACT inhaler Inhale 2 puffs into the lungs every 6 (six) hours as needed for wheezing or shortness of breath. 18 g 0   atorvastatin (LIPITOR) 20 MG tablet Take 1 tablet (20 mg total) by mouth daily. 90 tablet 0   Blood Glucose Monitoring Suppl (ACCU-CHEK GUIDE) w/Device KIT Check blood glucose level by fingerstick 3-4 times per day. 1 kit 0   Continuous Blood Gluc Sensor (FREESTYLE LIBRE 2 SENSOR) MISC Use as directed every 14 days. 6 each 3   diphenoxylate-atropine (LOMOTIL) 2.5-0.025 MG tablet Take 1 tablet by mouth 4 (four) times daily as needed for diarrhea or loose stools. 60 tablet 1   diphenoxylate-atropine (LOMOTIL) 2.5-0.025 MG tablet Take 1 tablet by mouth 4 (four) times daily as needed for diarrhea or loose stools. 60 tablet 0   DULoxetine (CYMBALTA) 60 MG capsule Take 1 capsule (60 mg total) by mouth daily. 90 capsule 1   EPINEPHrine 0.3 mg/0.3 mL IJ SOAJ injection Inject 0.3 mg into the muscle once as needed for anaphylaxis.     folic acid (FOLVITE)  1 MG tablet Take 1 tablet (1 mg total) by mouth in the morning.     glucose blood (ACCU-CHEK GUIDE) test strip Check blood glucose level by fingerstick 3-4 times per day. 100 each 12   Insulin Pen Needle (TECHLITE PEN NEEDLES) 32G X 4 MM MISC USE AS INSTRUCTED INTO THE SKIN TWICE DAILY 100 each 11   Insulin Pen Needle (TRUEPLUS PEN NEEDLES) 32G X 4 MM MISC Use as instructed. Inject into the skin twice daily 200 each 6   lidocaine-prilocaine (EMLA) cream Apply 1 application topically as directed as needed. (Patient taking differently: Apply 1 application topically once as needed (prior to port access).) 30 g 0   lipase/protease/amylase (CREON) 36000 UNITS CPEP capsule Take 2 capsules (72,000 Units total) by mouth 3 (three) times daily with meals. May also take 1 capsule (36,000 Units total) as needed (with snacks). 210 capsule 3   losartan (COZAAR) 50 MG tablet Take 1  tablet (50 mg total) by mouth daily. 90 tablet 1   magic mouthwash (nystatin, lidocaine, diphenhydrAMINE, alum & mag hydroxide) suspension Swish  88m around mouth & swallow/spit every 6 hours as needed 280 mL 1   magic mouthwash w/lidocaine SOLN Take 5 mLs by mouth every 6 (six) hours as needed for mouth pain. 280 mL 1   mirtazapine (REMERON) 30 MG tablet Take 1 tablet (30 mg total) by mouth at bedtime. 90 tablet 1   Multiple Vitamins-Minerals (THEREMS-M) TABS Take 1 tablet by mouth in the morning. 90 tablet 3   ondansetron (ZOFRAN) 8 MG tablet Take 1 tablet (8 mg total) by mouth 2 (two) times daily as needed. Start on day 3 after chemotherapy. (Patient taking differently: Take 8 mg by mouth 2 (two) times daily as needed for nausea or vomiting. Start on day 3 after chemotherapy.) 30 tablet 1   oxyCODONE (OXY IR/ROXICODONE) 5 MG immediate release tablet Take 1 tablet (5 mg total) by mouth every 6 (six) hours as needed for severe pain. 60 tablet 0   pantoprazole (PROTONIX) 40 MG tablet Take 1 tablet (40 mg total) by mouth daily. 90 tablet 1    polyethylene glycol powder (GLYCOLAX/MIRALAX) 17 GM/SCOOP powder Take 17 g by mouth at bedtime. 238 g 0   prochlorperazine (COMPAZINE) 10 MG tablet Take 1 tablet (10 mg total) by mouth every 6 (six) hours as needed (Nausea or vomiting). 30 tablet 1   metoCLOPramide (REGLAN) 5 MG tablet Take 1 tablet (5 mg total) by mouth 4 (four) times daily -  before meals and at bedtime. 90 tablet 1   nystatin-lidocaine-diphenhydrAMINE-alum & mag hydroxide-simeth-sterile water Take 5 mls by mouth four times a day as needed 400 mL 0   No current facility-administered medications on file prior to visit.    Allergies  Allergen Reactions   Bee Venom Anaphylaxis   Lactose Intolerance (Gi) Other (See Comments)    GI upset    Family History  Problem Relation Age of Onset   Breast cancer Mother    Stroke Father    Alcohol abuse Father    Heart disease Maternal Grandfather    Pancreatic cancer Paternal Grandmother    Diabetes Paternal Grandfather    Colon cancer Neg Hx    Stomach cancer Neg Hx    Rectal cancer Neg Hx    Esophageal cancer Neg Hx     BP (!) 148/86 (BP Location: Left Arm, Patient Position: Sitting, Cuff Size: Normal)   Pulse (!) 101   Ht '5\' 10"'  (1.778 m)   Wt 140 lb 3.2 oz (63.6 kg)   SpO2 99%   BMI 20.12 kg/m    Review of Systems He has lost a few more lbs.      Objective:   Physical Exam    Lab Results  Component Value Date   HGBA1C 10.3 (A) 07/09/2021      Assessment & Plan:  Insulin-requiring type 2 DM: uncontrolled.  We discussed.  He wants to change to qd insulin.   Patient Instructions  check your blood sugar twice a day.  vary the time of day when you check, between before the 3 meals, and at bedtime.  also check if you have symptoms of your blood sugar being too high or too low.  please keep a record of the readings and bring it to your next appointment here (or you can bring the meter itself).  You can write it on any piece of paper.  please call  us sooner if  your blood sugar goes below 70, or if most of your readings are over 200.   I have sent a prescription to your pharmacy, to change both insulins to NPH, 30 units each morning. We are placing a continuous glucose monitor sensor today.   Please come back for a follow-up appointment on 07/21/21, at 4:30 PM.

## 2021-07-10 ENCOUNTER — Encounter: Payer: Self-pay | Admitting: Hematology and Oncology

## 2021-07-10 ENCOUNTER — Other Ambulatory Visit: Payer: Self-pay

## 2021-07-10 MED ORDER — STERILE WATER FOR INJECTION IV SOLN
OROMUCOSAL | 0 refills | Status: DC
  Filled 2021-07-10 – 2021-08-18 (×7): qty 400, fill #0

## 2021-07-11 ENCOUNTER — Other Ambulatory Visit: Payer: Self-pay

## 2021-07-14 ENCOUNTER — Other Ambulatory Visit (HOSPITAL_COMMUNITY): Payer: Self-pay

## 2021-07-14 ENCOUNTER — Encounter: Payer: Self-pay | Admitting: Hematology and Oncology

## 2021-07-14 ENCOUNTER — Other Ambulatory Visit: Payer: Self-pay | Admitting: Hematology and Oncology

## 2021-07-14 ENCOUNTER — Other Ambulatory Visit: Payer: Self-pay

## 2021-07-14 MED ORDER — OXYCODONE HCL 5 MG PO TABS
5.0000 mg | ORAL_TABLET | Freq: Four times a day (QID) | ORAL | 0 refills | Status: DC | PRN
  Filled 2021-07-14: qty 60, 15d supply, fill #0

## 2021-07-15 ENCOUNTER — Other Ambulatory Visit: Payer: Self-pay

## 2021-07-15 ENCOUNTER — Ambulatory Visit (HOSPITAL_COMMUNITY)
Admission: RE | Admit: 2021-07-15 | Discharge: 2021-07-15 | Disposition: A | Discharge status: Home / Self Care | Payer: Medicaid Other | Source: Ambulatory Visit | Attending: Hematology and Oncology | Admitting: Hematology and Oncology

## 2021-07-15 DIAGNOSIS — C250 Malignant neoplasm of head of pancreas: Secondary | ICD-10-CM | POA: Insufficient documentation

## 2021-07-15 MED ORDER — IOHEXOL 350 MG/ML SOLN
80.0000 mL | Freq: Once | INTRAVENOUS | Status: AC | PRN
  Administered 2021-07-15: 80 mL via INTRAVENOUS

## 2021-07-16 ENCOUNTER — Inpatient Hospital Stay: Payer: Medicaid Other

## 2021-07-16 ENCOUNTER — Inpatient Hospital Stay (HOSPITAL_BASED_OUTPATIENT_CLINIC_OR_DEPARTMENT_OTHER): Payer: Medicaid Other | Admitting: Physician Assistant

## 2021-07-16 ENCOUNTER — Other Ambulatory Visit: Payer: Self-pay

## 2021-07-16 VITALS — BP 135/76 | HR 87 | Temp 97.7°F | Resp 16 | Ht 70.0 in | Wt 142.3 lb

## 2021-07-16 DIAGNOSIS — Z95828 Presence of other vascular implants and grafts: Secondary | ICD-10-CM

## 2021-07-16 DIAGNOSIS — C250 Malignant neoplasm of head of pancreas: Secondary | ICD-10-CM | POA: Diagnosis not present

## 2021-07-16 DIAGNOSIS — Z5111 Encounter for antineoplastic chemotherapy: Secondary | ICD-10-CM | POA: Diagnosis not present

## 2021-07-16 LAB — CMP (CANCER CENTER ONLY)
ALT: 14 U/L (ref 0–44)
AST: 24 U/L (ref 15–41)
Albumin: 3.9 g/dL (ref 3.5–5.0)
Alkaline Phosphatase: 90 U/L (ref 38–126)
Anion gap: 9 (ref 5–15)
BUN: 11 mg/dL (ref 6–20)
CO2: 24 mmol/L (ref 22–32)
Calcium: 9 mg/dL (ref 8.9–10.3)
Chloride: 103 mmol/L (ref 98–111)
Creatinine: 0.74 mg/dL (ref 0.61–1.24)
GFR, Estimated: >60 mL/min (ref >60)
Glucose, Bld: 344 mg/dL — ABNORMAL HIGH (ref 70–99)
Potassium: 3.8 mmol/L (ref 3.5–5.1)
Sodium: 136 mmol/L (ref 135–145)
Total Bilirubin: 0.4 mg/dL (ref 0.3–1.2)
Total Protein: 7.4 g/dL (ref 6.5–8.1)

## 2021-07-16 LAB — CBC WITH DIFFERENTIAL (CANCER CENTER ONLY)
Abs Immature Granulocytes: 0.02 10*3/uL (ref 0.00–0.07)
Basophils Absolute: 0 10*3/uL (ref 0.0–0.1)
Basophils Relative: 1 %
Eosinophils Absolute: 0.3 10*3/uL (ref 0.0–0.5)
Eosinophils Relative: 3 %
HCT: 35.7 % — ABNORMAL LOW (ref 39.0–52.0)
Hemoglobin: 12.1 g/dL — ABNORMAL LOW (ref 13.0–17.0)
Immature Granulocytes: 0 %
Lymphocytes Relative: 21 %
Lymphs Abs: 1.6 10*3/uL (ref 0.7–4.0)
MCH: 29.7 pg (ref 26.0–34.0)
MCHC: 33.9 g/dL (ref 30.0–36.0)
MCV: 87.5 fL (ref 80.0–100.0)
Monocytes Absolute: 1.2 10*3/uL — ABNORMAL HIGH (ref 0.1–1.0)
Monocytes Relative: 15 %
Neutro Abs: 4.8 10*3/uL (ref 1.7–7.7)
Neutrophils Relative %: 60 %
Platelet Count: 110 10*3/uL — ABNORMAL LOW (ref 150–400)
RBC: 4.08 MIL/uL — ABNORMAL LOW (ref 4.22–5.81)
RDW: 17.8 % — ABNORMAL HIGH (ref 11.5–15.5)
WBC Count: 7.9 10*3/uL (ref 4.0–10.5)
nRBC: 0 % (ref 0.0–0.2)

## 2021-07-16 MED ORDER — SODIUM CHLORIDE 0.9 % IV SOLN
10.0000 mg | Freq: Once | INTRAVENOUS | Status: AC
  Administered 2021-07-16: 10 mg via INTRAVENOUS
  Filled 2021-07-16: qty 10

## 2021-07-16 MED ORDER — SODIUM CHLORIDE 0.9 % IV SOLN
400.0000 mg/m2 | Freq: Once | INTRAVENOUS | Status: AC
  Administered 2021-07-16: 748 mg via INTRAVENOUS
  Filled 2021-07-16: qty 37.4

## 2021-07-16 MED ORDER — OXALIPLATIN CHEMO INJECTION 100 MG/20ML
85.0000 mg/m2 | Freq: Once | INTRAVENOUS | Status: AC
  Administered 2021-07-16: 160 mg via INTRAVENOUS
  Filled 2021-07-16: qty 12

## 2021-07-16 MED ORDER — SODIUM CHLORIDE 0.9 % IV SOLN
150.0000 mg | Freq: Once | INTRAVENOUS | Status: AC
  Administered 2021-07-16: 150 mg via INTRAVENOUS
  Filled 2021-07-16: qty 150

## 2021-07-16 MED ORDER — ATROPINE SULFATE 1 MG/ML IV SOLN
0.5000 mg | Freq: Once | INTRAVENOUS | Status: AC
  Administered 2021-07-16: 0.5 mg via INTRAVENOUS
  Filled 2021-07-16: qty 1

## 2021-07-16 MED ORDER — SODIUM CHLORIDE 0.9 % IV SOLN
150.0000 mg/m2 | Freq: Once | INTRAVENOUS | Status: AC
  Administered 2021-07-16: 280 mg via INTRAVENOUS
  Filled 2021-07-16: qty 14

## 2021-07-16 MED ORDER — SODIUM CHLORIDE 0.9% FLUSH
10.0000 mL | Freq: Once | INTRAVENOUS | Status: AC
Start: 2021-07-16 — End: 2021-07-16
  Administered 2021-07-16: 10 mL

## 2021-07-16 MED ORDER — SODIUM CHLORIDE 0.9 % IV SOLN
2400.0000 mg/m2 | INTRAVENOUS | Status: DC
  Administered 2021-07-16: 4500 mg via INTRAVENOUS
  Filled 2021-07-16: qty 90

## 2021-07-16 MED ORDER — PALONOSETRON HCL INJECTION 0.25 MG/5ML
0.2500 mg | Freq: Once | INTRAVENOUS | Status: AC
  Administered 2021-07-16: 0.25 mg via INTRAVENOUS
  Filled 2021-07-16: qty 5

## 2021-07-16 MED ORDER — DEXTROSE 5 % IV SOLN: Freq: Once | INTRAVENOUS | Status: AC

## 2021-07-16 NOTE — Progress Notes (Signed)
[name] Telephone:([phone]) [phone]   [phone]) [phone]  PROGRESS NOTE  Patient Care Team: [name], NP as PCP - General (Nurse Practitioner) Havery Moros, Carlota Raspberry, MD as Consulting Physician (Gastroenterology) Orson Slick, MD as Consulting Physician (Oncology) Royston Bake, RN as Registered Nurse  Hematological/Oncological History # Adenocarcinoma of the Pancreas, Stage IIB 02/26/2021: patient seen by GI for jaundice and pancreatic mass causing CBD obstruction. CT abdomen showed pancreatic head mass (4.4 x 3.7 cm) and enlarged portocaval lymph nodes. 02/27/2021: ERCP with placement of a nonmetallic biliary stent. FNA of pancreatic head showed malignant cells consistent with pancreatic adenocarcinoma. 03/05/2021: CT Chest showed no evidence of metastatic disease in the chest. 03/12/2021: establish care with Dr. Lorenso Courier 04/10/2021: Cycle 1 Day 1 of FOLFIRINOX 04/24/2021: Cycle 2 Day 1 of FOLFIRINOX 05/22/2021: Cycle 3 Day 1 of FOLFIRINOX. Delayed x 2 weeks due to hospitalization for poor po intake and dehydration due to gastroparesis. 06/05/2021: Cycle 4 Day 1 of FOLFIRINOX 06/19/2021: Cycle 5 Day 1 of FOLFIRINOX 07/02/2021: Cycle 6 Day 1 of FOLFIRINOX 07/16/2021: Cycle 7 Day 1 of FOLFIRINOX   Interval History:  [name] [name] 60 y.o. male with medical history significant for borderline resectable pancreatic cancer presents for a follow up visit. The patient's last visit was on 07/02/2021. He presents to start cycle 7 of FOLFIRINOX.  On exam today [name] reports that his energy levels are fairly stable. He is able to complete his daily activities independently. His appetite is good and denies any recent changes to his weight. His nausea is mild and under control. He takes antiemetics as needed with relief. He reports stable mid abdominal pain, that is mainly present in the evening. He continues to take oxycodone as needed with improvement of pain. His bowel  movements are unchanged without diarrhea or constipation. He denies easy bruising or signs of bleeding.  He has mild cold sensitivity following treatment. He has chronic neuropathy affecting his feet without any interference to his balance. He continues to have some dizziness mainly with position changes. He denies any syncopal episodes. Patient denies fevers, chills, night sweats, shortness of breath, chest pain or cough. Full 10 point ROS is listed below.  He is willing and able to proceed with treatment at this time.  MEDICAL HISTORY:  Past Medical History:  Diagnosis Date   Barrett's esophagus dx 2016   Bronchitis    Chronic hepatitis C without hepatic coma ([place]) 10/31/2014   Depression    Diabetes ([place])    ED (erectile dysfunction)    GERD (gastroesophageal reflux disease)    Hepatitis C    Hiatal hernia 10/2014   3cm   Neuropathy    S/P transmetatarsal amputation of foot, left ([place]) 11/28/2020    SURGICAL HISTORY: Past Surgical History:  Procedure Laterality Date   AMPUTATION Left 11/15/2020   Procedure: LEFT TRANSMETATARSALS AMPUTATION;  Surgeon: Newt Minion, MD;  Location: [place];  Service: Orthopedics;  Laterality: Left;   APPENDECTOMY     BILIARY STENT PLACEMENT N/A 02/27/2021   Procedure: BILIARY STENT PLACEMENT;  Surgeon: Milus Banister, MD;  Location: [facility];  Service: Endoscopy;  Laterality: N/A;   BILIARY STENT PLACEMENT N/A 03/27/2021   Procedure: BILIARY STENT PLACEMENT;  Surgeon: Jackquline Denmark, MD;  Location: [facility];  Service: Endoscopy;  Laterality: N/A;   BIOPSY  03/27/2021   Procedure: BIOPSY;  Surgeon: Jackquline Denmark, MD;  Location: [facility];  Service: Endoscopy;;   COLONOSCOPY N/A 02/16/2014  Procedure: COLONOSCOPY;  Surgeon: Gatha Mayer, MD;  Location: [facility];  Service: Endoscopy;  Laterality: N/A;   COLONOSCOPY     ENDOSCOPIC RETROGRADE CHOLANGIOPANCREATOGRAPHY (ERCP) WITH PROPOFOL N/A 02/27/2021   Procedure: ENDOSCOPIC RETROGRADE  CHOLANGIOPANCREATOGRAPHY (ERCP) WITH PROPOFOL;  Surgeon: Milus Banister, MD;  Location: [facility];  Service: Endoscopy;  Laterality: N/A;   ERCP N/A 03/27/2021   Procedure: ENDOSCOPIC RETROGRADE CHOLANGIOPANCREATOGRAPHY (ERCP);  Surgeon: Jackquline Denmark, MD;  Location: [facility];  Service: Endoscopy;  Laterality: N/A;   ESOPHAGOGASTRODUODENOSCOPY (EGD) WITH PROPOFOL N/A 05/06/2021   Procedure: ESOPHAGOGASTRODUODENOSCOPY (EGD) WITH PROPOFOL;  Surgeon: Mauri Pole, MD;  Location: [facility];  Service: Endoscopy;  Laterality: N/A;   EUS N/A 02/27/2021   Procedure: UPPER ENDOSCOPIC ULTRASOUND (EUS) RADIAL;  Surgeon: Milus Banister, MD;  Location: [facility];  Service: Endoscopy;  Laterality: N/A;   FINE NEEDLE ASPIRATION N/A 02/27/2021   Procedure: FINE NEEDLE ASPIRATION (FNA) LINEAR;  Surgeon: Milus Banister, MD;  Location: [facility];  Service: Endoscopy;  Laterality: N/A;   IR IMAGING GUIDED PORT INSERTION  03/21/2021   SPHINCTEROTOMY  02/27/2021   Procedure: SPHINCTEROTOMY;  Surgeon: Milus Banister, MD;  Location: [facility];  Service: Endoscopy;;   STENT REMOVAL  03/27/2021   Procedure: Lavell Islam REMOVAL;  Surgeon: Jackquline Denmark, MD;  Location: [facility];  Service: Endoscopy;;   TONSILLECTOMY      SOCIAL HISTORY: Social History   Socioeconomic History   Marital status: Single    Spouse name: Not on file   Number of children: Not on file   Years of education: Not on file   Highest education level: Not on file  Occupational History   Not on file  Tobacco Use   Smoking status: Every Day    Packs/day: 0.50    Years: 35.00    Pack years: 17.50    Types: Cigarettes   Smokeless tobacco: Never  Vaping Use   Vaping Use: Never used  Substance and Sexual Activity   Alcohol use: Not Currently    Comment: previous   Drug use: No   Sexual activity: Not on file  Other Topics Concern   Not on file  Social History Narrative   Not on file   Social Determinants of  Health   Financial Resource Strain: High Risk   Difficulty of Paying Living Expenses: Very hard  Food Insecurity: Food Insecurity Present   Worried About Charity fundraiser in the Last Year: Sometimes true   Arboriculturist in the Last Year: Sometimes true  Transportation Needs: Public librarian (Medical): Yes   Lack of Transportation (Non-Medical): Yes  Physical Activity: Not on file  Stress: Not on file  Social Connections: Not on file  Intimate Partner Violence: Not on file    FAMILY HISTORY: Family History  Problem Relation Age of Onset   Breast cancer Mother    Stroke Father    Alcohol abuse Father    Heart disease Maternal Grandfather    Pancreatic cancer Paternal Grandmother    Diabetes Paternal Grandfather    Colon cancer Neg Hx    Stomach cancer Neg Hx    Rectal cancer Neg Hx    Esophageal cancer Neg Hx     ALLERGIES:  is allergic to bee venom and lactose intolerance (gi).  MEDICATIONS:  Current Outpatient Medications  Medication Sig Dispense Refill   Accu-Chek Softclix Lancets lancets Check blood glucose level by fingerstick 3-4 times per day.  100 each 3   albuterol (VENTOLIN HFA) 108 (90 Base) MCG/ACT inhaler Inhale 2 puffs into the lungs every 6 (six) hours as needed for wheezing or shortness of breath. 18 g 0   atorvastatin (LIPITOR) 20 MG tablet Take 1 tablet (20 mg total) by mouth daily. 90 tablet 0   Blood Glucose Monitoring Suppl (ACCU-CHEK GUIDE) w/Device KIT Check blood glucose level by fingerstick 3-4 times per day. 1 kit 0   diphenoxylate-atropine (LOMOTIL) 2.5-0.025 MG tablet Take 1 tablet by mouth 4 (four) times daily as needed for diarrhea or loose stools. 60 tablet 1   diphenoxylate-atropine (LOMOTIL) 2.5-0.025 MG tablet Take 1 tablet by mouth 4 (four) times daily as needed for diarrhea or loose stools. 60 tablet 0   DULoxetine (CYMBALTA) 60 MG capsule Take 1 capsule (60 mg total) by mouth daily. 90 capsule 1    EPINEPHrine 0.3 mg/0.3 mL IJ SOAJ injection Inject 0.3 mg into the muscle once as needed for anaphylaxis.     folic acid (FOLVITE) 1 MG tablet Take 1 tablet (1 mg total) by mouth in the morning.     glucose blood (ACCU-CHEK GUIDE) test strip Check blood glucose level by fingerstick 3-4 times per day. 100 each 12   Insulin NPH, Human,, Isophane, (HUMULIN N KWIKPEN) 100 UNIT/ML Kiwkpen Inject 30 Units into the skin every morning. And pen needles 1/day 30 mL 3   Insulin Pen Needle (TECHLITE PEN NEEDLES) 32G X 4 MM MISC USE AS INSTRUCTED INTO THE SKIN TWICE DAILY 100 each 11   Insulin Pen Needle (TRUEPLUS PEN NEEDLES) 32G X 4 MM MISC Use as instructed. Inject into the skin twice daily 200 each 6   lidocaine-prilocaine (EMLA) cream Apply 1 application topically as directed as needed. (Patient taking differently: Apply 1 application topically once as needed (prior to port access).) 30 g 0   lipase/protease/amylase (CREON) 36000 UNITS CPEP capsule Take 2 capsules (72,000 Units total) by mouth 3 (three) times daily with meals. May also take 1 capsule (36,000 Units total) as needed (with snacks). 210 capsule 3   losartan (COZAAR) 50 MG tablet Take 1 tablet (50 mg total) by mouth daily. 90 tablet 1   magic mouthwash (nystatin, lidocaine, diphenhydrAMINE, alum & mag hydroxide) suspension Swish  54m around mouth & swallow/spit every 6 hours as needed 280 mL 1   mirtazapine (REMERON) 30 MG tablet Take 1 tablet (30 mg total) by mouth at bedtime. 90 tablet 1   Multiple Vitamins-Minerals (THEREMS-M) TABS Take 1 tablet by mouth in the morning. 90 tablet 3   ondansetron (ZOFRAN) 8 MG tablet Take 1 tablet (8 mg total) by mouth 2 (two) times daily as needed. Start on day 3 after chemotherapy. (Patient taking differently: Take 8 mg by mouth 2 (two) times daily as needed for nausea or vomiting. Start on day 3 after chemotherapy.) 30 tablet 1   oxyCODONE (OXY IR/ROXICODONE) 5 MG immediate release tablet Take 1 tablet (5 mg  total) by mouth every 6 (six) hours as needed for severe pain. 60 tablet 0   pantoprazole (PROTONIX) 40 MG tablet Take 1 tablet (40 mg total) by mouth daily. 90 tablet 1   prochlorperazine (COMPAZINE) 10 MG tablet Take 1 tablet (10 mg total) by mouth every 6 (six) hours as needed (Nausea or vomiting). 30 tablet 1   Continuous Blood Gluc Sensor (FREESTYLE LIBRE 2 SENSOR) MISC Use as directed every 14 days. 6 each 3   metoCLOPramide (REGLAN) 5 MG tablet Take 1 tablet (5  mg total) by mouth 4 (four) times daily -  before meals and at bedtime. 90 tablet 1   nystatin-lidocaine-diphenhydrAMINE-alum & mag hydroxide-simeth-sterile water Take 5 mls by mouth four times a day as needed 400 mL 0   polyethylene glycol powder (GLYCOLAX/MIRALAX) 17 GM/SCOOP powder Take 17 g by mouth at bedtime. (Patient not taking: Reported on 07/16/2021) 238 g 0   No current facility-administered medications for this visit.    REVIEW OF SYSTEMS:   Constitutional: ( - ) fevers, ( - )  chills , ( - ) night sweats Eyes: ( - ) blurriness of vision, ( - ) double vision, ( - ) watery eyes Ears, nose, mouth, throat, and face: ( - ) mucositis, ( - ) sore throat Respiratory: ( - ) cough, ( - ) dyspnea, ( - ) wheezes Cardiovascular: ( - ) palpitation, ( - ) chest discomfort, ( - ) lower extremity swelling Gastrointestinal:  ( +) nausea, ( - ) heartburn, ( - ) change in bowel habits Skin: ( - ) abnormal skin rashes Lymphatics: ( - ) new lymphadenopathy, ( - ) easy bruising Neurological: ( - ) numbness, ( - ) tingling, ( - ) new weaknesses Behavioral/Psych: ( - ) mood change, ( - ) new changes  All other systems were reviewed with the patient and are negative.  PHYSICAL EXAMINATION: ECOG PERFORMANCE STATUS: 1 - Symptomatic but completely ambulatory  Vitals:   07/16/21 0836  BP: 135/76  Pulse: 87  Resp: 16  Temp: 97.7 F (36.5 C)  SpO2: 100%      Filed Weights   07/16/21 0836  Weight: 142 lb 4.8 oz (64.5 kg)    GENERAL: Well-appearing middle-age Caucasian male, alert, no distress and comfortable SKIN: skin color, texture, turgor are normal, no rashes or significant lesions.  EYES: conjunctiva are pink and non-injected, sclera clear LUNGS: clear to auscultation and percussion with normal breathing effort HEART: regular rate & rhythm and no murmurs and no lower extremity edema PSYCH: alert & oriented x 3, fluent speech NEURO: no focal motor/sensory deficits  LABORATORY DATA:  I have reviewed the data as listed CBC Latest Ref Rng & Units 07/16/2021 07/02/2021 06/19/2021  WBC 4.0 - 10.5 K/uL 7.9 7.0 6.5  Hemoglobin 13.0 - 17.0 g/dL 12.1(L) 12.2(L) 10.5(L)  Hematocrit 39.0 - 52.0 % 35.7(L) 36.4(L) 32.3(L)  Platelets 150 - 400 K/uL 110(L) 150 153    CMP Latest Ref Rng & Units 07/02/2021 06/19/2021 06/04/2021  Glucose 70 - 99 mg/dL 307(H) 190(H) 199(H)  BUN 6 - 20 mg/dL '11 9 7  ' Creatinine 0.61 - 1.24 mg/dL 0.66 0.62 0.62  Sodium 135 - 145 mmol/L 136 141 141  Potassium 3.5 - 5.1 mmol/L 3.9 4.0 3.3(L)  Chloride 98 - 111 mmol/L 106 108 106  CO2 22 - 32 mmol/L '23 26 24  ' Calcium 8.9 - 10.3 mg/dL 8.8(L) 9.1 9.3  Total Protein 6.5 - 8.1 g/dL 7.1 6.9 7.6  Total Bilirubin 0.3 - 1.2 mg/dL 0.4 0.3 0.4  Alkaline Phos 38 - 126 U/L 89 80 107  AST 15 - 41 U/L '19 23 27  ' ALT 0 - 44 U/L '14 13 15    ' RADIOGRAPHIC STUDIES: I have personally reviewed the radiological images as listed and agreed with the findings in the report: Borderline resectable pancreatic mass with no evidence of metastatic disease. No results found.  ASSESSMENT & PLAN RENARDO CHEATUM 60 y.o. male with medical history significant for borderline resectable pancreatic cancer presents for a follow  up visit.   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  The patient does still have an elevation in bilirubin which  likely represents a decline from the time of the latest stent placement.  He was evaluated by surgery who determined this is a borderline resectable tumor.  We will plan to start the patient on neoadjuvant chemotherapy.  The 2 options would be Gem/Abraxane or FOLFIRINOX.  Given his excellent functional status I do believe he would be a good candidate for FOLFIRINOX.   # Adenocarcinoma of the Pancreas, Stage IIB -- At this time disease appears borderline resectable.  We will proceed with neoadjuvant chemotherapy.  At this time would prefer a regimen of FOLFIRINOX given his good baseline health. -- case reviewed by surgery, who agrees his disease is borderline resectable.  -- baseline elevations in both CA 19-9 and CEA. Continue to monitor during treatment.  --Staging scans complete.  No need for further imaging at this time. Will plan for repeat imaging in approximately 8 cycles to reassess --Reassuringly during his admission for intractable nausea and vomiting a CT scan of the abdomen showed marked response in the tumor to the chemotherapy treatment. -- Cycle 1 Day 1 of FOLFIRINOX started 04/10/2021 Plan:  --proceed with Cycle 7 Day 1 of treatment today  --labs from today were reviewed, adequate to proceed with treatment today --awaiting final results from restaging CT scan obtained yesterday. Plan to call patient with the results.  -- RTC in 2 weeks for Cycle 8 of treatment  #Gastroparesis #Nausea/Vomiting/Poor PO Intake --patient advanced diet to solid foods.  --nausea meds as below. --continue to work on glycemic control --continue to monitor.   #Chemotherapy induced Anemia and Thrombocytopenia --Hgb is stable at 12.1, continue to monitor --Plt decreased to 110K, no signs of bleeding. --ok to proceed with treatment --continue to monitor   # Pain Control -- Patient currently taking ibuprofen for his abdominal pain --continue oxycodone 5 to 10 mg every 6 hours as  needed  #Hyperglycemia: --today's glucose level is 344.  --insulin-requiring type 2 DM --under the care of endocrinology, follow up on 07/21/2021  #Supportive Care -- chemotherapy education complete -- port placed -- zofran 19m q8H PRN and compazine 133mPO q6H for nausea --trial of Creon called into pharmacy to help with loose stools. -- EMLA cream for port  No orders of the defined types were placed in this encounter.  All questions were answered. The patient knows to call the clinic with any problems, questions or concerns.  I have spent a total of 30 minutes minutes of face-to-face and non-face-to-face time, preparing to see the patient, performing a medically appropriate examination, counseling and educating the patient, documenting clinical information in the electronic health record, independently interpreting results and communicating results to the patient, and care coordination.   IrLincoln BrighamPA-C Department of Hematology/Oncology CoMidwayt WeLif[facility] Fort Worthhone: [phone]/16/2022 9:00 AM

## 2021-07-16 NOTE — Patient Instructions (Signed)
[facility]COL[facility]  Discharge Instructions: Thank you for choosing [facility] to provide your oncology and hematology care.   If you have a lab appointment with the [facility], please go directly to the [facility] and check in at the registration area.   Wear comfortable clothing and clothing appropriate for easy access to any Portacath or PICC line.   We strive to give you quality time with your provider. You may need to reschedule your appointment if you arrive late (15 or more minutes).  Arriving late affects you and other patients whose appointments are after yours.  Also, if you miss three or more appointments without notifying the office, you may be dismissed from the clinic at the provider's discretion.      For prescription refill requests, have your pharmacy contact our office and allow 72 hours for refills to be completed.    Today you received the following chemotherapy and/or immunotherapy agents oxaliplatin, leucovorin, irinotecan, fluorourcil.      To help prevent nausea and vomiting after your treatment, we encourage you to take your nausea medication as directed.  BELOW ARE SYMPTOMS THAT SHOULD BE REPORTED IMMEDIATELY: *FEVER GREATER THAN 100.4 F (38 C) OR HIGHER *CHILLS OR SWEATING *NAUSEA AND VOMITING THAT IS NOT CONTROLLED WITH YOUR NAUSEA MEDICATION *UNUSUAL SHORTNESS OF BREATH *UNUSUAL BRUISING OR BLEEDING *URINARY PROBLEMS (pain or burning when urinating, or frequent urination) *BOWEL PROBLEMS (unusual diarrhea, constipation, pain near the anus) TENDERNESS IN MOUTH AND THROAT WITH OR WITHOUT PRESENCE OF ULCERS (sore throat, sores in mouth, or a toothache) UNUSUAL RASH, SWELLING OR PAIN  UNUSUAL VAGINAL DISCHARGE OR ITCHING   Items with * indicate a potential emergency and should be followed up as soon as possible or go to the Emergency Department if any problems should occur.  Please show the CHEMOTHERAPY ALERT CARD or  IMMUNOTHERAPY ALERT CARD at check-in to the Emergency Department and triage nurse.  Should you have questions after your visit or need to cancel or reschedule your appointment, please contact [facility]  Dept: [phone]  and follow the prompts.  Office hours are 8:00 a.m. to 4:30 p.m. Monday - Friday. Please note that voicemails left after 4:00 p.m. may not be returned until the following business day.  We are closed weekends and major holidays. You have access to a nurse at all times for urgent questions. Please call the main number to the clinic Dept: [phone] and follow the prompts.   For any non-urgent questions, you may also contact your provider using MyChart. We now offer e-Visits for anyone 60 and older to request care online for non-urgent symptoms. For details visit [email or web address].   Also download the MyChart app! Go to the app store, search "MyChart", open the app, select [facility], and log in with your MyChart username and password.  Due to Covid, a mask is required upon entering the hospital/clinic. If you do not have a mask, one will be given to you upon arrival. For doctor visits, patients may have 1 support person aged 82 or older with them. For treatment visits, patients cannot have anyone with them due to current Covid guidelines and our immunocompromised population.

## 2021-07-17 ENCOUNTER — Other Ambulatory Visit (HOSPITAL_COMMUNITY): Payer: Self-pay

## 2021-07-18 ENCOUNTER — Other Ambulatory Visit (HOSPITAL_COMMUNITY): Payer: Self-pay

## 2021-07-18 ENCOUNTER — Inpatient Hospital Stay: Payer: Medicaid Other

## 2021-07-18 ENCOUNTER — Other Ambulatory Visit: Payer: Self-pay

## 2021-07-18 VITALS — BP 104/67 | HR 69 | Temp 98.7°F | Resp 16

## 2021-07-18 DIAGNOSIS — Z95828 Presence of other vascular implants and grafts: Secondary | ICD-10-CM

## 2021-07-18 DIAGNOSIS — Z5111 Encounter for antineoplastic chemotherapy: Secondary | ICD-10-CM | POA: Diagnosis not present

## 2021-07-18 MED ORDER — SODIUM CHLORIDE 0.9% FLUSH
10.0000 mL | Freq: Once | INTRAVENOUS | Status: AC
  Administered 2021-07-18: 10 mL

## 2021-07-18 MED ORDER — HEPARIN SOD (PORK) LOCK FLUSH 100 UNIT/ML IV SOLN
500.0000 [IU] | Freq: Once | INTRAVENOUS | Status: AC
  Administered 2021-07-18: 500 [IU]

## 2021-07-21 ENCOUNTER — Telehealth: Payer: Self-pay | Admitting: Endocrinology

## 2021-07-21 ENCOUNTER — Ambulatory Visit: Payer: Medicaid Other | Admitting: Endocrinology

## 2021-07-21 ENCOUNTER — Other Ambulatory Visit: Payer: Self-pay

## 2021-07-21 VITALS — BP 130/68 | HR 85 | Ht 70.0 in | Wt 138.0 lb

## 2021-07-21 DIAGNOSIS — E118 Type 2 diabetes mellitus with unspecified complications: Secondary | ICD-10-CM

## 2021-07-21 MED ORDER — NOVOLIN N FLEXPEN RELION 100 UNIT/ML ~~LOC~~ SUPN
30.0000 [IU] | PEN_INJECTOR | SUBCUTANEOUS | 3 refills | Status: DC
  Filled 2021-07-21 – 2021-07-22 (×4): qty 30, 100d supply, fill #0

## 2021-07-21 NOTE — Progress Notes (Signed)
He has been steady on chemotherapy, tolerating it well overall.

## 2021-07-21 NOTE — Telephone Encounter (Signed)
One of my pharmacist who works at [name] [employer] just told me that you can buy Novolin N OTC at their pharmacy without a prescription sent. So that is probably why a PA is required for it...Marland KitchenMarland Kitchenif he has to have this medicaition then he could get it there for a cheaper price than him paying cash.......or if its a possibility for him to take lantus, medicaid pays for that  I am aware of the OTC status of NPH, but pt is requesting pens.   Due to the pattern of cbg's, NPH is preferred over Lantus in this situation.  Please verify that Medicaid does not pay for NPH pens, so I will send rx for vials.

## 2021-07-21 NOTE — Patient Instructions (Addendum)
check your blood sugar twice a day.  vary the time of day when you check, between before the 3 meals, and at bedtime.  also check if you have symptoms of your blood sugar being too high or too low.  please keep a record of the readings and bring it to your next appointment here (or you can bring the meter itself).  You can write it on any piece of paper.  please call us sooner if your blood sugar goes below 70, or if most of your readings are over 200.    We are asking medicaid which NPH insulin they prefer.  In the meantime, I have sent a prescription to your pharmacy, to change to a different brand of NPH insulin.   Please come back for a follow-up appointment in 6 weeks.

## 2021-07-21 NOTE — Progress Notes (Signed)
Subjective:    Patient ID: [name] [name], male    DOB: [birth year], 60 y.o.   MRN: [id number]  HPI Pt returns for f/u of diabetes mellitus:  DM type: Insulin-requiring type 2 Dx'ed: 8264 Complications: left TMA and GP. Therapy: insulin since 2021 DKA: never Severe hypoglycemia: never Pancreatitis: never Pancreatic imaging (2022): large tumor in the head.   SDOH: none Other: stage 2B pancreatic cancer; He says chemo does not affect cbg; he eats just 2 meals/day. Interval history: medicaid has not yet approved NPH insulin, so he still takes the other 2 insulins.  I reviewed continuous glucose monitor data.  Glucose varies from 160-400.  It is in general highest at [facility], and lowest at 11PM-5AM.  Whipple procedure is planned for January.   Past Medical History:  Diagnosis Date   Barrett's esophagus dx 2016   Bronchitis    Chronic hepatitis C without hepatic coma ([place]) 10/31/2014   Depression    Diabetes ([place])    ED (erectile dysfunction)    GERD (gastroesophageal reflux disease)    Hepatitis C    Hiatal hernia 10/2014   3cm   Neuropathy    S/P transmetatarsal amputation of foot, left ([place]) 11/28/2020    Past Surgical History:  Procedure Laterality Date   AMPUTATION Left 11/15/2020   Procedure: LEFT TRANSMETATARSALS AMPUTATION;  Surgeon: Newt Minion, MD;  Location: [facility];  Service: Orthopedics;  Laterality: Left;   APPENDECTOMY     BILIARY STENT PLACEMENT N/A 02/27/2021   Procedure: BILIARY STENT PLACEMENT;  Surgeon: Milus Banister, MD;  Location: [facility];  Service: Endoscopy;  Laterality: N/A;   BILIARY STENT PLACEMENT N/A 03/27/2021   Procedure: BILIARY STENT PLACEMENT;  Surgeon: Jackquline Denmark, MD;  Location: [facility];  Service: Endoscopy;  Laterality: N/A;   BIOPSY  03/27/2021   Procedure: BIOPSY;  Surgeon: Jackquline Denmark, MD;  [facility];  Service: Endoscopy;;   COLONOSCOPY N/A 02/16/2014   Procedure: COLONOSCOPY;  Surgeon: Gatha Mayer, MD;   [facility];  Service: Endoscopy;  Laterality: N/A;   COLONOSCOPY     ENDOSCOPIC RETROGRADE CHOLANGIOPANCREATOGRAPHY (ERCP) WITH PROPOFOL N/A 02/27/2021   Procedure: ENDOSCOPIC RETROGRADE CHOLANGIOPANCREATOGRAPHY (ERCP) WITH PROPOFOL;  Surgeon: Milus Banister, MD;  [facility];  Service: Endoscopy;  Laterality: N/A;   ERCP N/A 03/27/2021   Procedure: ENDOSCOPIC RETROGRADE CHOLANGIOPANCREATOGRAPHY (ERCP);  Surgeon: Jackquline Denmark, MD;  Location: [facility];  Service: Endoscopy;  Laterality: N/A;   ESOPHAGOGASTRODUODENOSCOPY (EGD) WITH PROPOFOL N/A 05/06/2021   Procedure: ESOPHAGOGASTRODUODENOSCOPY (EGD) WITH PROPOFOL;  Surgeon: Mauri Pole, MD;  [facility];  Service: Endoscopy;  Laterality: N/A;   EUS N/A 02/27/2021   Procedure: UPPER ENDOSCOPIC ULTRASOUND (EUS) RADIAL;  Surgeon: Milus Banister, MD;  [facility];  Service: Endoscopy;  Laterality: N/A;   FINE NEEDLE ASPIRATION N/A 02/27/2021   Procedure: FINE NEEDLE ASPIRATION (FNA) LINEAR;  Surgeon: Milus Banister, MD;  [facility];  Service: Endoscopy;  Laterality: N/A;   IR IMAGING GUIDED PORT INSERTION  03/21/2021   SPHINCTEROTOMY  02/27/2021   Procedure: SPHINCTEROTOMY;  Surgeon: Milus Banister, MD;  [facility];  Service: Endoscopy;;   STENT REMOVAL  03/27/2021   Procedure: STENT REMOVAL;  Surgeon: Jackquline Denmark, MD;  [facility];  Service: Endoscopy;;   TONSILLECTOMY      Social History   Socioeconomic History   Marital status: Single    Spouse name: Not on file   Number of children: Not  on file   Years of education: Not on file   Highest education level: Not on file  Occupational History   Not on file  Tobacco Use   Smoking status: Every Day    Packs/day: 0.50    Years: 35.00    Pack years: 17.50    Types: Cigarettes   Smokeless tobacco: Never  Vaping Use   Vaping Use: Never used  Substance and Sexual Activity   Alcohol use: Not  Currently    Comment: previous   Drug use: No   Sexual activity: Not on file  Other Topics Concern   Not on file  Social History Narrative   Not on file   Social Determinants of Health   Financial Resource Strain: High Risk   Difficulty of Paying Living Expenses: Very hard  Food Insecurity: Food Insecurity Present   Worried About Charity fundraiser in the Last Year: Sometimes true   Arboriculturist in the Last Year: Sometimes true  Transportation Needs: Unmet Transportation Needs   Lack of Transportation (Medical): Yes   Lack of Transportation (Non-Medical): Yes  Physical Activity: Not on file  Stress: Not on file  Social Connections: Not on file  Intimate Partner Violence: Not on file    Current Outpatient Medications on File Prior to Visit  Medication Sig Dispense Refill   Accu-Chek Softclix Lancets lancets Check blood glucose level by fingerstick 3-4 times per day. 100 each 3   albuterol (VENTOLIN HFA) 108 (90 Base) MCG/ACT inhaler Inhale 2 puffs into the lungs every 6 (six) hours as needed for wheezing or shortness of breath. 18 g 0   atorvastatin (LIPITOR) 20 MG tablet Take 1 tablet (20 mg total) by mouth daily. 90 tablet 0   Blood Glucose Monitoring Suppl (ACCU-CHEK GUIDE) w/Device KIT Check blood glucose level by fingerstick 3-4 times per day. 1 kit 0   diphenoxylate-atropine (LOMOTIL) 2.5-0.025 MG tablet Take 1 tablet by mouth 4 (four) times daily as needed for diarrhea or loose stools. 60 tablet 1   diphenoxylate-atropine (LOMOTIL) 2.5-0.025 MG tablet Take 1 tablet by mouth 4 (four) times daily as needed for diarrhea or loose stools. 60 tablet 0   DULoxetine (CYMBALTA) 60 MG capsule Take 1 capsule (60 mg total) by mouth daily. 90 capsule 1   EPINEPHrine 0.3 mg/0.3 mL IJ SOAJ injection Inject 0.3 mg into the muscle once as needed for anaphylaxis.     folic acid (FOLVITE) 1 MG tablet Take 1 tablet (1 mg total) by mouth in the morning.     glucose blood (ACCU-CHEK GUIDE)  test strip Check blood glucose level by fingerstick 3-4 times per day. 100 each 12   Insulin Pen Needle (TECHLITE PEN NEEDLES) 32G X 4 MM MISC USE AS INSTRUCTED INTO THE SKIN TWICE DAILY 100 each 11   Insulin Pen Needle (TRUEPLUS PEN NEEDLES) 32G X 4 MM MISC Use as instructed. Inject into the skin twice daily 200 each 6   lidocaine-prilocaine (EMLA) cream Apply 1 application topically as directed as needed. (Patient taking differently: Apply 1 application topically once as needed (prior to port access).) 30 g 0   lipase/protease/amylase (CREON) 36000 UNITS CPEP capsule Take 2 capsules (72,000 Units total) by mouth 3 (three) times daily with meals. May also take 1 capsule (36,000 Units total) as needed (with snacks). 210 capsule 3   losartan (COZAAR) 50 MG tablet Take 1 tablet (50 mg total) by mouth daily. 90 tablet 1   magic mouthwash (nystatin, lidocaine,  diphenhydrAMINE, alum & mag hydroxide) suspension Swish  67m around mouth & swallow/spit every 6 hours as needed 280 mL 1   mirtazapine (REMERON) 30 MG tablet Take 1 tablet (30 mg total) by mouth at bedtime. 90 tablet 1   Multiple Vitamins-Minerals (THEREMS-M) TABS Take 1 tablet by mouth in the morning. 90 tablet 3   nystatin-lidocaine-diphenhydrAMINE-alum & mag hydroxide-simeth-sterile water Take 5 mls by mouth four times a day as needed 400 mL 0   ondansetron (ZOFRAN) 8 MG tablet Take 1 tablet (8 mg total) by mouth 2 (two) times daily as needed. Start on day 3 after chemotherapy. (Patient taking differently: Take 8 mg by mouth 2 (two) times daily as needed for nausea or vomiting. Start on day 3 after chemotherapy.) 30 tablet 1   oxyCODONE (OXY IR/ROXICODONE) 5 MG immediate release tablet Take 1 tablet (5 mg total) by mouth every 6 (six) hours as needed for severe pain. 60 tablet 0   pantoprazole (PROTONIX) 40 MG tablet Take 1 tablet (40 mg total) by mouth daily. 90 tablet 1   polyethylene glycol powder (GLYCOLAX/MIRALAX) 17 GM/SCOOP powder Take 17 g  by mouth at bedtime. 238 g 0   prochlorperazine (COMPAZINE) 10 MG tablet Take 1 tablet (10 mg total) by mouth every 6 (six) hours as needed (Nausea or vomiting). 30 tablet 1   metoCLOPramide (REGLAN) 5 MG tablet Take 1 tablet (5 mg total) by mouth 4 (four) times daily -  before meals and at bedtime. 90 tablet 1   No current facility-administered medications on file prior to visit.    Allergies  Allergen Reactions   Bee Venom Anaphylaxis   Lactose Intolerance (Gi) Other (See Comments)    GI upset    Family History  Problem Relation Age of Onset   Breast cancer Mother    Stroke Father    Alcohol abuse Father    Heart disease Maternal Grandfather    Pancreatic cancer Paternal Grandmother    Diabetes Paternal Grandfather    Colon cancer Neg Hx    Stomach cancer Neg Hx    Rectal cancer Neg Hx    Esophageal cancer Neg Hx     BP 130/68 (BP Location: Right Arm, Patient Position: Sitting, Cuff Size: Normal)   Pulse 85   Ht '5\' 10"'  (1.778 m)   Wt 138 lb (62.6 kg)   SpO2 97%   BMI 19.80 kg/m   Review of Systems He has lost 2 lbs since last ov     Objective:   Physical Exam       Assessment & Plan:  Insulin-requiring type 2 DM: uncontrolled.  He needs a faster-acting qam insulin  Patient Instructions  check your blood sugar twice a day.  vary the time of day when you check, between before the 3 meals, and at bedtime.  also check if you have symptoms of your blood sugar being too high or too low.  please keep a record of the readings and bring it to your next appointment here (or you can bring the meter itself).  You can write it on any piece of paper.  please call [email or web address] if your blood sugar goes below 70, or if most of your readings are over 200.    We are asking medicaid which NPH insulin they prefer.  In the meantime, I have sent a prescription to your pharmacy, to change to a different brand of NPH insulin.   Please come back for a follow-up appointment in 6 weeks.

## 2021-07-22 ENCOUNTER — Other Ambulatory Visit: Payer: Self-pay

## 2021-07-22 ENCOUNTER — Encounter: Payer: Self-pay | Admitting: Hematology and Oncology

## 2021-07-22 ENCOUNTER — Telehealth: Payer: Self-pay | Admitting: Pharmacy Technician

## 2021-07-22 ENCOUNTER — Telehealth: Payer: Self-pay | Admitting: Endocrinology

## 2021-07-22 ENCOUNTER — Other Ambulatory Visit (HOSPITAL_COMMUNITY): Payer: Self-pay

## 2021-07-22 DIAGNOSIS — E1140 Type 2 diabetes mellitus with diabetic neuropathy, unspecified: Secondary | ICD-10-CM

## 2021-07-22 DIAGNOSIS — Z794 Long term (current) use of insulin: Secondary | ICD-10-CM

## 2021-07-22 MED ORDER — INSULIN NPH (HUMAN) (ISOPHANE) 100 UNIT/ML ~~LOC~~ SUSP
SUBCUTANEOUS | 3 refills | Status: DC
  Filled 2021-07-22: qty 10, 33d supply, fill #0
  Filled 2021-07-22: qty 10, 28d supply, fill #0
  Filled 2021-07-22: qty 10, 33d supply, fill #0
  Filled 2021-07-23: qty 10, 28d supply, fill #0
  Filled 2021-08-27: qty 10, 28d supply, fill #1

## 2021-07-22 NOTE — Telephone Encounter (Signed)
Pharmacy is calling to see if they can get the prescription for insulin in a vial instead of pen.  Also Medicaid will only cover Humulin vial and will not cover any other brand or pen.  Pharmacist stated that they have called previously about this and have not gotten a response. [facility] [place] Phone:  [phone]  Fax:  [phone]

## 2021-07-22 NOTE — Telephone Encounter (Signed)
-----   Message from Renato Shin, MD sent at 07/21/2021  4:37 PM EST ----- Ins declined Humulin NPH pen.  What alternative is covered?  TY

## 2021-07-22 NOTE — Telephone Encounter (Signed)
Humulin N vial sent in since that is what patients insurance covers. Sent in same dose as Novolin N.

## 2021-07-22 NOTE — Telephone Encounter (Signed)
Patient Advocate Encounter   Received notification from Dr. Loanne Drilling that prior authorization for Humulin N is required by his/her insurance Medicaid.   PA submitted on 07/22/21 Confirmation # [id number] W Status is denied    Patient Advocate Fax:  [phone]

## 2021-07-22 NOTE — Telephone Encounter (Signed)
error 

## 2021-07-23 ENCOUNTER — Other Ambulatory Visit: Payer: Self-pay

## 2021-07-23 NOTE — Progress Notes (Signed)
The proposed treatment discussed in conference is for discussion purpose only and is not a binding recommendation.  The patients have not been physically examined, or presented with their treatment options.  Therefore, final treatment plans cannot be decided.  

## 2021-07-28 ENCOUNTER — Other Ambulatory Visit: Payer: Self-pay

## 2021-07-29 ENCOUNTER — Other Ambulatory Visit: Payer: Self-pay

## 2021-07-29 NOTE — Progress Notes (Signed)
Opened for chart review

## 2021-07-30 ENCOUNTER — Inpatient Hospital Stay: Payer: Medicaid Other

## 2021-07-30 ENCOUNTER — Other Ambulatory Visit: Payer: Self-pay | Admitting: Hematology and Oncology

## 2021-07-30 ENCOUNTER — Inpatient Hospital Stay (HOSPITAL_BASED_OUTPATIENT_CLINIC_OR_DEPARTMENT_OTHER): Payer: Medicaid Other | Admitting: Physician Assistant

## 2021-07-30 ENCOUNTER — Inpatient Hospital Stay: Payer: Medicaid Other | Admitting: Nutrition

## 2021-07-30 ENCOUNTER — Other Ambulatory Visit: Payer: Self-pay

## 2021-07-30 ENCOUNTER — Encounter: Payer: Self-pay | Admitting: Hematology and Oncology

## 2021-07-30 ENCOUNTER — Inpatient Hospital Stay: Payer: Medicaid Other | Admitting: Dietician

## 2021-07-30 ENCOUNTER — Encounter: Payer: Self-pay | Admitting: Endocrinology

## 2021-07-30 VITALS — BP 147/90 | HR 98 | Temp 98.1°F | Resp 18 | Ht 70.0 in | Wt 138.0 lb

## 2021-07-30 DIAGNOSIS — C250 Malignant neoplasm of head of pancreas: Secondary | ICD-10-CM

## 2021-07-30 DIAGNOSIS — Z95828 Presence of other vascular implants and grafts: Secondary | ICD-10-CM

## 2021-07-30 DIAGNOSIS — Z5111 Encounter for antineoplastic chemotherapy: Secondary | ICD-10-CM | POA: Diagnosis not present

## 2021-07-30 LAB — CMP (CANCER CENTER ONLY)
ALT: 17 U/L (ref 0–44)
AST: 26 U/L (ref 15–41)
Albumin: 4.1 g/dL (ref 3.5–5.0)
Alkaline Phosphatase: 95 U/L (ref 38–126)
Anion gap: 11 (ref 5–15)
BUN: 10 mg/dL (ref 6–20)
CO2: 25 mmol/L (ref 22–32)
Calcium: 9.2 mg/dL (ref 8.9–10.3)
Chloride: 99 mmol/L (ref 98–111)
Creatinine: 0.49 mg/dL — ABNORMAL LOW (ref 0.61–1.24)
GFR, Estimated: >60 mL/min (ref >60)
Glucose, Bld: 254 mg/dL — ABNORMAL HIGH (ref 70–99)
Potassium: 3.5 mmol/L (ref 3.5–5.1)
Sodium: 135 mmol/L (ref 135–145)
Total Bilirubin: 0.4 mg/dL (ref 0.3–1.2)
Total Protein: 8.1 g/dL (ref 6.5–8.1)

## 2021-07-30 LAB — CBC WITH DIFFERENTIAL (CANCER CENTER ONLY)
Abs Immature Granulocytes: 0.01 10*3/uL (ref 0.00–0.07)
Basophils Absolute: 0 10*3/uL (ref 0.0–0.1)
Basophils Relative: 1 %
Eosinophils Absolute: 0.2 10*3/uL (ref 0.0–0.5)
Eosinophils Relative: 2 %
HCT: 37.7 % — ABNORMAL LOW (ref 39.0–52.0)
Hemoglobin: 12.7 g/dL — ABNORMAL LOW (ref 13.0–17.0)
Immature Granulocytes: 0 %
Lymphocytes Relative: 47 %
Lymphs Abs: 3.4 10*3/uL (ref 0.7–4.0)
MCH: 29.8 pg (ref 26.0–34.0)
MCHC: 33.7 g/dL (ref 30.0–36.0)
MCV: 88.5 fL (ref 80.0–100.0)
Monocytes Absolute: 1.4 10*3/uL — ABNORMAL HIGH (ref 0.1–1.0)
Monocytes Relative: 20 %
Neutro Abs: 2.1 10*3/uL (ref 1.7–7.7)
Neutrophils Relative %: 30 %
Platelet Count: 121 10*3/uL — ABNORMAL LOW (ref 150–400)
RBC: 4.26 MIL/uL (ref 4.22–5.81)
RDW: 18.5 % — ABNORMAL HIGH (ref 11.5–15.5)
WBC Count: 7.2 10*3/uL (ref 4.0–10.5)
nRBC: 0 % (ref 0.0–0.2)

## 2021-07-30 MED ORDER — PALONOSETRON HCL INJECTION 0.25 MG/5ML
0.2500 mg | Freq: Once | INTRAVENOUS | Status: AC
  Administered 2021-07-30: 0.25 mg via INTRAVENOUS
  Filled 2021-07-30: qty 5

## 2021-07-30 MED ORDER — SODIUM CHLORIDE 0.9 % IV SOLN
10.0000 mg | Freq: Once | INTRAVENOUS | Status: AC
  Administered 2021-07-30: 10 mg via INTRAVENOUS
  Filled 2021-07-30: qty 10

## 2021-07-30 MED ORDER — DEXTROSE 5 % IV SOLN: Freq: Once | INTRAVENOUS | Status: AC

## 2021-07-30 MED ORDER — SODIUM CHLORIDE 0.9% FLUSH: 10.0000 mL | INTRAVENOUS | Status: DC | PRN

## 2021-07-30 MED ORDER — OXALIPLATIN CHEMO INJECTION 100 MG/20ML
85.0000 mg/m2 | Freq: Once | INTRAVENOUS | Status: AC
  Administered 2021-07-30: 160 mg via INTRAVENOUS
  Filled 2021-07-30: qty 32

## 2021-07-30 MED ORDER — SODIUM CHLORIDE 0.9 % IV SOLN
150.0000 mg/m2 | Freq: Once | INTRAVENOUS | Status: AC
  Administered 2021-07-30: 280 mg via INTRAVENOUS
  Filled 2021-07-30: qty 14

## 2021-07-30 MED ORDER — SODIUM CHLORIDE 0.9 % IV SOLN
400.0000 mg/m2 | Freq: Once | INTRAVENOUS | Status: AC
  Administered 2021-07-30: 748 mg via INTRAVENOUS
  Filled 2021-07-30: qty 25

## 2021-07-30 MED ORDER — SODIUM CHLORIDE 0.9 % IV SOLN
150.0000 mg | Freq: Once | INTRAVENOUS | Status: AC
  Administered 2021-07-30: 150 mg via INTRAVENOUS
  Filled 2021-07-30: qty 150

## 2021-07-30 MED ORDER — ATROPINE SULFATE 1 MG/ML IV SOLN
0.5000 mg | Freq: Once | INTRAVENOUS | Status: AC
  Administered 2021-07-30: 0.5 mg via INTRAVENOUS
  Filled 2021-07-30: qty 1

## 2021-07-30 MED ORDER — HEPARIN SOD (PORK) LOCK FLUSH 100 UNIT/ML IV SOLN: 500.0000 [IU] | Freq: Once | INTRAVENOUS | Status: DC | PRN

## 2021-07-30 MED ORDER — SODIUM CHLORIDE 0.9 % IV SOLN
2400.0000 mg/m2 | INTRAVENOUS | Status: DC
  Administered 2021-07-30: 4500 mg via INTRAVENOUS
  Filled 2021-07-30: qty 90

## 2021-07-30 MED ORDER — SODIUM CHLORIDE 0.9% FLUSH
10.0000 mL | Freq: Once | INTRAVENOUS | Status: AC
  Administered 2021-07-30: 10 mL

## 2021-07-30 NOTE — Patient Instructions (Signed)
[facility] ONCOLOGY  Discharge Instructions: Thank you for choosing [facility] to provide your oncology and hematology care.   If you have a lab appointment with the [facility], please go directly to the [facility] and check in at the registration area.   Wear comfortable clothing and clothing appropriate for easy access to any Portacath or PICC line.   We strive to give you quality time with your provider. You may need to reschedule your appointment if you arrive late (15 or more minutes).  Arriving late affects you and other patients whose appointments are after yours.  Also, if you miss three or more appointments without notifying the office, you may be dismissed from the clinic at the provider's discretion.      For prescription refill requests, have your pharmacy contact our office and allow 72 hours for refills to be completed.    Today you received the following chemotherapy and/or immunotherapy agents oxaliplatin, leucovorin, irinotecan, fluorourcil.      To help prevent nausea and vomiting after your treatment, we encourage you to take your nausea medication as directed.  BELOW ARE SYMPTOMS THAT SHOULD BE REPORTED IMMEDIATELY: *FEVER GREATER THAN 100.4 F (38 C) OR HIGHER *CHILLS OR SWEATING *NAUSEA AND VOMITING THAT IS NOT CONTROLLED WITH YOUR NAUSEA MEDICATION *UNUSUAL SHORTNESS OF BREATH *UNUSUAL BRUISING OR BLEEDING *URINARY PROBLEMS (pain or burning when urinating, or frequent urination) *BOWEL PROBLEMS (unusual diarrhea, constipation, pain near the anus) TENDERNESS IN MOUTH AND THROAT WITH OR WITHOUT PRESENCE OF ULCERS (sore throat, sores in mouth, or a toothache) UNUSUAL RASH, SWELLING OR PAIN  UNUSUAL VAGINAL DISCHARGE OR ITCHING   Items with * indicate a potential emergency and should be followed up as soon as possible or go to the Emergency Department if any problems should occur.  Please show the CHEMOTHERAPY ALERT CARD or  IMMUNOTHERAPY ALERT CARD at check-in to the Emergency Department and triage nurse.  Should you have questions after your visit or need to cancel or reschedule your appointment, please contact [facility]  Dept: [phone]  and follow the prompts.  Office hours are 8:00 a.m. to 4:30 p.m. Monday - Friday. Please note that voicemails left after 4:00 p.m. may not be returned until the following business day.  We are closed weekends and major holidays. You have access to a nurse at all times for urgent questions. Please call the main number to the clinic Dept: [phone] and follow the prompts.   For any non-urgent questions, you may also contact your provider using MyChart. We now offer e-Visits for anyone 20 and older to request care online for non-urgent symptoms. For details visit [email or web address].   Also download the MyChart app! Go to the app store, search "MyChart", open the app, select [facility], and log in with your MyChart username and password.  Due to Covid, a mask is required upon entering the hospital/clinic. If you do not have a mask, one will be given to you upon arrival. For doctor visits, patients may have 1 support person aged 41 or older with them. For treatment visits, patients cannot have anyone with them due to current Covid guidelines and our immunocompromised population.

## 2021-07-30 NOTE — Progress Notes (Signed)
[name] [phone]   [phone]  PROGRESS NOTE  Patient Care Team: [name], NP as PCP - General (Nurse Practitioner) Havery Moros, Carlota Raspberry, MD as Consulting Physician (Gastroenterology) Orson Slick, MD as Consulting Physician (Oncology) Royston Bake, RN as Registered Nurse  Hematological/Oncological History # Adenocarcinoma of the Pancreas, Stage IIB 02/26/2021: patient seen by GI for jaundice and pancreatic mass causing CBD obstruction. CT abdomen showed pancreatic head mass (4.4 x 3.7 cm) and enlarged portocaval lymph nodes. 02/27/2021: ERCP with placement of a nonmetallic biliary stent. FNA of pancreatic head showed malignant cells consistent with pancreatic adenocarcinoma. 03/05/2021: CT Chest showed no evidence of metastatic disease in the chest. 03/12/2021: establish care with Dr. Lorenso Courier 04/10/2021: Cycle 1 Day 1 of FOLFIRINOX 04/24/2021: Cycle 2 Day 1 of FOLFIRINOX 05/22/2021: Cycle 3 Day 1 of FOLFIRINOX. Delayed x 2 weeks due to hospitalization for poor po intake and dehydration due to gastroparesis. 06/05/2021: Cycle 4 Day 1 of FOLFIRINOX 06/19/2021: Cycle 5 Day 1 of FOLFIRINOX 07/02/2021: Cycle 6 Day 1 of FOLFIRINOX 07/16/2021: Cycle 7 Day 1 of FOLFIRINOX  07/30/2021: Cycle 8 Day 1 of FOLFIRINOX   Interval History:  [name] [name] 60 y.o. male with medical history significant for borderline resectable pancreatic cancer presents for a follow up visit. The patient's last visit was on 07/16/2021. He presents to start cycle 8 of FOLFIRINOX.  On exam today [name] reports slight decrease in his energy levels.  He tries to stay active by walking and is able to complete all his ADLs on his own.  Patient reports decreased appetite mainly due to taste changes.  His weight is overall stable.  Patient reports increased nausea over Thanksgiving holidays but symptoms were well controlled with antiemetics.  He continues to have mid  abdominal pain that is stable and controlled with oxycodone as needed.  Patient reports regular bowel movements without any diarrhea or constipation.  Denies easy bruising or signs of bleeding.  Patient reports cold sensitivity lasted longer, nearly the entire 2 weeks.  He continues to have chronic neuropathy affecting his feet without any interference with balance.  Patient continues to have dizziness mainly with positional changes.  Patient reports having phlegm in the morning which he needs to cough up.  He denies any fevers, chills, night sweats, shortness of breath or chest pain.  He has no other complaints. Full 10 point ROS is listed below.  He is willing and able to proceed with treatment at this time.  MEDICAL HISTORY:  Past Medical History:  Diagnosis Date   Barrett's esophagus dx 2016   Bronchitis    Chronic hepatitis C without hepatic coma ([place]) 10/31/2014   Depression    Diabetes ([place])    ED (erectile dysfunction)    GERD (gastroesophageal reflux disease)    Hepatitis C    Hiatal hernia 10/2014   3cm   Neuropathy    S/P transmetatarsal amputation of foot, left ([place]) 11/28/2020    SURGICAL HISTORY: Past Surgical History:  Procedure Laterality Date   AMPUTATION Left 11/15/2020   Procedure: LEFT TRANSMETATARSALS AMPUTATION;  Surgeon: Newt Minion, MD;  Location: [facility];  Service: Orthopedics;  Laterality: Left;   APPENDECTOMY     BILIARY STENT PLACEMENT N/A 02/27/2021   Procedure: BILIARY STENT PLACEMENT;  Surgeon: Milus Banister, MD;  [facility]: [facility];  Service: Endoscopy;  Laterality: N/A;   BILIARY STENT PLACEMENT N/A 03/27/2021   Procedure: BILIARY STENT PLACEMENT;  Surgeon: Lyndel Safe,  Peyton Bottoms, MD;  [facility]: [facility];  Service: Endoscopy;  Laterality: N/A;   BIOPSY  03/27/2021   Procedure: BIOPSY;  Surgeon: Jackquline Denmark, MD;  [facility];  Service: Endoscopy;;   COLONOSCOPY N/A 02/16/2014   Procedure: COLONOSCOPY;  Surgeon: Gatha Mayer, MD;  Location:  [facility];  Service: Endoscopy;  Laterality: N/A;   COLONOSCOPY     ENDOSCOPIC RETROGRADE CHOLANGIOPANCREATOGRAPHY (ERCP) WITH PROPOFOL N/A 02/27/2021   Procedure: ENDOSCOPIC RETROGRADE CHOLANGIOPANCREATOGRAPHY (ERCP) WITH PROPOFOL;  Surgeon: Milus Banister, MD;  [facility];  Service: Endoscopy;  Laterality: N/A;   ERCP N/A 03/27/2021   Procedure: ENDOSCOPIC RETROGRADE CHOLANGIOPANCREATOGRAPHY (ERCP);  Surgeon: Jackquline Denmark, MD;  [facility];  Service: Endoscopy;  Laterality: N/A;   ESOPHAGOGASTRODUODENOSCOPY (EGD) WITH PROPOFOL N/A 05/06/2021   Procedure: ESOPHAGOGASTRODUODENOSCOPY (EGD) WITH PROPOFOL;  Surgeon: Mauri Pole, MD;  [facility];  Service: Endoscopy;  Laterality: N/A;   EUS N/A 02/27/2021   Procedure: UPPER ENDOSCOPIC ULTRASOUND (EUS) RADIAL;  Surgeon: Milus Banister, MD;  [facility];  Service: Endoscopy;  Laterality: N/A;   FINE NEEDLE ASPIRATION N/A 02/27/2021   Procedure: FINE NEEDLE ASPIRATION (FNA) LINEAR;  Surgeon: Milus Banister, MD;  [facility];  Service: Endoscopy;  Laterality: N/A;   IR IMAGING GUIDED PORT INSERTION  03/21/2021   SPHINCTEROTOMY  02/27/2021   Procedure: SPHINCTEROTOMY;  Surgeon: Milus Banister, MD;  [facility];  Service: Endoscopy;;   STENT REMOVAL  03/27/2021   Procedure: Lavell Islam REMOVAL;  Surgeon: Jackquline Denmark, MD;  [facility];  Service: Endoscopy;;   TONSILLECTOMY      SOCIAL HISTORY: Social History   Socioeconomic History   Marital status: Single    Spouse name: Not on file   Number of children: Not on file   Years of education: Not on file   Highest education level: Not on file  Occupational History   Not on file  Tobacco Use   Smoking status: Every Day    Packs/day: 0.50    Years: 35.00    Pack years: 17.50    Types: Cigarettes   Smokeless tobacco: Never  Vaping Use   Vaping Use: Never used  Substance and Sexual Activity   Alcohol use: Not  Currently    Comment: previous   Drug use: No   Sexual activity: Not on file  Other Topics Concern   Not on file  Social History Narrative   Not on file   Social Determinants of Health   Financial Resource Strain: High Risk   Difficulty of Paying Living Expenses: Very hard  Food Insecurity: Food Insecurity Present   Worried About Charity fundraiser in the Last Year: Sometimes true   Arboriculturist in the Last Year: Sometimes true  Transportation Needs: Unmet Transportation Needs   Lack of Transportation (Medical): Yes   Lack of Transportation (Non-Medical): Yes  Physical Activity: Not on file  Stress: Not on file  Social Connections: Not on file  Intimate Partner Violence: Not on file    FAMILY HISTORY: Family History  Problem Relation Age of Onset   Breast cancer Mother    Stroke Father    Alcohol abuse Father    Heart disease Maternal Grandfather    Pancreatic cancer Paternal Grandmother    Diabetes Paternal Grandfather    Colon cancer Neg Hx    Stomach cancer Neg Hx    Rectal cancer Neg Hx    Esophageal cancer Neg Hx  ALLERGIES:  is allergic to bee venom and lactose intolerance (gi).  MEDICATIONS:  Current Outpatient Medications  Medication Sig Dispense Refill   Accu-Chek Softclix Lancets lancets Check blood glucose level by fingerstick 3-4 times per day. 100 each 3   albuterol (VENTOLIN HFA) 108 (90 Base) MCG/ACT inhaler Inhale 2 puffs into the lungs every 6 (six) hours as needed for wheezing or shortness of breath. 18 g 0   atorvastatin (LIPITOR) 20 MG tablet Take 1 tablet (20 mg total) by mouth daily. 90 tablet 0   Blood Glucose Monitoring Suppl (ACCU-CHEK GUIDE) w/Device KIT Check blood glucose level by fingerstick 3-4 times per day. 1 kit 0   diphenoxylate-atropine (LOMOTIL) 2.5-0.025 MG tablet Take 1 tablet by mouth 4 (four) times daily as needed for diarrhea or loose stools. 60 tablet 1   DULoxetine (CYMBALTA) 60 MG capsule Take 1 capsule (60 mg total)  by mouth daily. 90 capsule 1   EPINEPHrine 0.3 mg/0.3 mL IJ SOAJ injection Inject 0.3 mg into the muscle once as needed for anaphylaxis.     folic acid (FOLVITE) 1 MG tablet Take 1 tablet (1 mg total) by mouth in the morning.     glucose blood (ACCU-CHEK GUIDE) test strip Check blood glucose level by fingerstick 3-4 times per day. 100 each 12   insulin NPH Human (HUMULIN N) 100 UNIT/ML injection Inject 30 Units into the skin every morning. 30 mL 3   Insulin Pen Needle (TECHLITE PEN NEEDLES) 32G X 4 MM MISC USE AS INSTRUCTED INTO THE SKIN TWICE DAILY 100 each 11   Insulin Pen Needle (TRUEPLUS PEN NEEDLES) 32G X 4 MM MISC Use as instructed. Inject into the skin twice daily 200 each 6   lidocaine-prilocaine (EMLA) cream Apply 1 application topically as directed as needed. (Patient taking differently: Apply 1 application topically once as needed (prior to port access).) 30 g 0   lipase/protease/amylase (CREON) 36000 UNITS CPEP capsule Take 2 capsules (72,000 Units total) by mouth 3 (three) times daily with meals. May also take 1 capsule (36,000 Units total) as needed (with snacks). 210 capsule 3   losartan (COZAAR) 50 MG tablet Take 1 tablet (50 mg total) by mouth daily. 90 tablet 1   magic mouthwash (nystatin, lidocaine, diphenhydrAMINE, alum & mag hydroxide) suspension Swish  37m around mouth & swallow/spit every 6 hours as needed 280 mL 1   mirtazapine (REMERON) 30 MG tablet Take 1 tablet (30 mg total) by mouth at bedtime. 90 tablet 1   Multiple Vitamins-Minerals (THEREMS-M) TABS Take 1 tablet by mouth in the morning. 90 tablet 3   nystatin-lidocaine-diphenhydrAMINE-alum & mag hydroxide-simeth-sterile water Take 5 mls by mouth four times a day as needed 400 mL 0   ondansetron (ZOFRAN) 8 MG tablet Take 1 tablet (8 mg total) by mouth 2 (two) times daily as needed. Start on day 3 after chemotherapy. (Patient taking differently: Take 8 mg by mouth 2 (two) times daily as needed for nausea or vomiting. Start  on day 3 after chemotherapy.) 30 tablet 1   oxyCODONE (OXY IR/ROXICODONE) 5 MG immediate release tablet Take 1 tablet (5 mg total) by mouth every 6 (six) hours as needed for severe pain. 60 tablet 0   pantoprazole (PROTONIX) 40 MG tablet Take 1 tablet (40 mg total) by mouth daily. 90 tablet 1   polyethylene glycol powder (GLYCOLAX/MIRALAX) 17 GM/SCOOP powder Take 17 g by mouth at bedtime. 238 g 0   prochlorperazine (COMPAZINE) 10 MG tablet Take 1 tablet (10 mg  total) by mouth every 6 (six) hours as needed (Nausea or vomiting). 30 tablet 1   diphenoxylate-atropine (LOMOTIL) 2.5-0.025 MG tablet Take 1 tablet by mouth 4 (four) times daily as needed for diarrhea or loose stools. 60 tablet 0   metoCLOPramide (REGLAN) 5 MG tablet Take 1 tablet (5 mg total) by mouth 4 (four) times daily -  before meals and at bedtime. 90 tablet 1   No current facility-administered medications for this visit.    REVIEW OF SYSTEMS:   Constitutional: ( - ) fevers, ( - )  chills , ( - ) night sweats Eyes: ( - ) blurriness of vision, ( - ) double vision, ( - ) watery eyes Ears, nose, mouth, throat, and face: ( - ) mucositis, ( - ) sore throat Respiratory: ( - ) cough, ( - ) dyspnea, ( - ) wheezes Cardiovascular: ( - ) palpitation, ( - ) chest discomfort, ( - ) lower extremity swelling Gastrointestinal:  ( +) nausea, ( - ) heartburn, ( - ) change in bowel habits Skin: ( - ) abnormal skin rashes Lymphatics: ( - ) new lymphadenopathy, ( - ) easy bruising Neurological: ( - ) numbness, ( - ) tingling, ( - ) new weaknesses Behavioral/Psych: ( - ) mood change, ( - ) new changes  All other systems were reviewed with the patient and are negative.  PHYSICAL EXAMINATION: ECOG PERFORMANCE STATUS: 1 - Symptomatic but completely ambulatory  Vitals:   07/30/21 0814  BP: (!) 147/90  Pulse: 98  Resp: 18  Temp: 98.1 F (36.7 C)  SpO2: 98%      Filed Weights   07/30/21 0814  Weight: 138 lb (62.6 kg)   GENERAL:  Well-appearing middle-age Caucasian male, alert, no distress and comfortable SKIN: skin color, texture, turgor are normal, no rashes or significant lesions.  EYES: conjunctiva are pink and non-injected, sclera clear LUNGS: clear to auscultation and percussion with normal breathing effort HEART: regular rate & rhythm and no murmurs and no lower extremity edema ABDOMEN: mild tenderness to palpation in the epigastric region. No guarding.  PSYCH: alert & oriented x 3, fluent speech NEURO: no focal motor/sensory deficits  LABORATORY DATA:  I have reviewed the data as listed CBC Latest Ref Rng & Units 07/30/2021 07/16/2021 07/02/2021  WBC 4.0 - 10.5 K/uL 7.2 7.9 7.0  Hemoglobin 13.0 - 17.0 g/dL 12.7(L) 12.1(L) 12.2(L)  Hematocrit 39.0 - 52.0 % 37.7(L) 35.7(L) 36.4(L)  Platelets 150 - 400 K/uL 121(L) 110(L) 150    CMP Latest Ref Rng & Units 07/30/2021 07/16/2021 07/02/2021  Glucose 70 - 99 mg/dL 254(H) 344(H) 307(H)  BUN 6 - 20 mg/dL _0 Creatinine 0.61 - 1.24 mg/dL 0.49(L) 0.74 0.66  Sodium 135 - 145 mmol/L 135 136 136  Potassium 3.5 - 5.1 mmol/L 3.5 3.8 3.9  Chloride 98 - 111 mmol/L 99 103 106  CO2 22 - 32 mmol/L _1 Calcium 8.9 - 10.3 mg/dL 9.2 9.0 8.8(L)  Total Protein 6.5 - 8.1 g/dL 8.1 7.4 7.1  Total Bilirubin 0.3 - 1.2 mg/dL 0.4 0.4 0.4  Alkaline Phos 38 - 126 U/L 95 90 89  AST 15 - 41 U/L _2 ALT 0 - 44 U/L _3 RADIOGRAPHIC STUDIES: I have personally reviewed the radiological images as listed and agreed with the findings in the report: Borderline resectable pancreatic mass with no evidence of metastatic disease. CT PANCREAS ABD W/WO  Result Date: 07/16/2021 CLINICAL  DATA:  Pancreatic cancer, assess treatment response EXAM: CT ABDOMEN WITHOUT AND WITH CONTRAST TECHNIQUE: Multidetector CT imaging of the abdomen was performed following the standard protocol before and following the bolus administration of intravenous contrast. CONTRAST:  4m OMNIPAQUE  IOHEXOL 350 MG/ML SOLN, additional enteric COMPARISON:  05/04/2021 FINDINGS: Lower chest: No acute abnormality. Hepatobiliary: No focal liver abnormality is seen. Small gallstones in the gallbladder. Mild gallbladder wall thickening and minimal pericholecystic fluid, similar prior examination. Common bile duct stent remains in position within the pancreatic head with post stenting pneumobilia. Pancreas: No significant change in expansile mass of the central pancreatic head, although ill-defined central portion measuring approximately 3.4 x 3.0 cm (series 2, image 51). Unchanged distal pancreatic dilatation measuring up to 0.5 cm (series 2, image 44. Spleen: Normal in size without focal abnormality. Adrenals/Urinary Tract: Adrenal glands are unremarkable. Kidneys are normal, without renal calculi, focal lesion, or hydronephrosis. Stomach/Bowel: Stomach is within normal limits. Appendix not clearly visualized and may be surgically absent. No evidence of bowel wall thickening, distention, or inflammatory changes. Vascular/Lymphatic: Aortic atherosclerosis. Enlarged periportal and portacaval lymph nodes similar to prior examination, index periportal node measuring 2.0 x 2.0 cm (series 2, image 34) Other: No abdominal wall hernia or abnormality. Musculoskeletal: No acute or significant osseous findings. IMPRESSION: 1. No significant change in expansile mass of the central pancreatic head, consistent with pancreatic adenocarcinoma. 2. Common bile duct stent remains in position within the pancreatic head with post stenting pneumobilia. 3. Enlarged periportal and portacaval lymph nodes are not significantly changed. 4. Cholelithiasis with mild gallbladder wall thickening and minimal pericholecystic fluid, similar prior examination. Aortic Atherosclerosis (ICD10-I70.0). Electronically Signed   By: ADelanna AhmadiM.D.   On: 07/16/2021 14:17    ASSESSMENT & PLAN [name] KARNES89y.o. male with medical history significant  for borderline resectable pancreatic cancer presents for a follow up visit.   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  The patient does still have an elevation in bilirubin which likely represents a decline from the time of the latest stent placement.  He was evaluated by surgery who determined this is a borderline resectable tumor.  We will plan to start the patient on neoadjuvant chemotherapy.  The 2 options would be Gem/Abraxane or FOLFIRINOX.  Given his excellent functional status I do believe he would be a good candidate for FOLFIRINOX.   # Adenocarcinoma of the Pancreas, Stage IIB -- At this time disease appears borderline resectable.  We will proceed with neoadjuvant chemotherapy.  At this time would prefer a regimen of FOLFIRINOX given his good baseline health. -- case reviewed by surgery, who agrees his disease is borderline resectable.  -- baseline elevations in both CA 19-9 and CEA. Continue to monitor during treatment.  -- Cycle 1 Day 1 of FOLFIRINOX started 04/10/2021 Plan:  --Most recent CT scans from 07/15/2021 showed primary tumor is stable in size but still abuts the SMV.  --Dr. AZenia Resideshas referred [facility].  --labs from today were reviewed, adequate to proceed with treatment today --proceed with Cycle 8, Day 1 of treatment today.  -- RTC in 2 weeks for Cycle 9 of treatment  #Gastroparesis #Nausea/Vomiting/Poor PO Intake --patient advanced diet to solid foods.  --nausea meds as below. --continue to work on glycemic control --continue to monitor.   #Chemotherapy induced Anemia and Thrombocytopenia --Hgb is  stable at 12.1, continue to monitor --Plt decreased to 110K, no signs of bleeding. --ok to proceed with treatment --continue to monitor   # Pain Control -- Patient currently taking ibuprofen for his  abdominal pain --continue oxycodone 5 to 10 mg every 6 hours as needed  #Hyperglycemia: --today's glucose level is 254.  --insulin-requiring type 2 DM --under the care of endocrinology  #Supportive Care -- chemotherapy education complete -- port placed -- zofran 57m q8H PRN and compazine 168mPO q6H for nausea --trial of Creon called into pharmacy to help with loose stools. -- EMLA cream for port  No orders of the defined types were placed in this encounter.  All questions were answered. The patient knows to call the clinic with any problems, questions or concerns.  I have spent a total of 30 minutes minutes of face-to-face and non-face-to-face time, preparing to see the patient, performing a medically appropriate examination, counseling and educating the patient, documenting clinical information in the electronic health record, independently interpreting results and communicating results to the patient, and care coordination.   IrLincoln BrighamPA-C Department of Hematology/Oncology [facility] [place]: [phone]/30/2022 8:52 AM

## 2021-07-30 NOTE — Progress Notes (Signed)
Nutrition Follow-up:  Patient receiving neoadjuvant folfirinox for borderline resectable pancreatic cancer. Patient is pending appointment with surgical oncology at [facility].   Met with patient during infusion. He reports decreased appetite. Patient reports metallic taste to foods, he has thought about using plastic utensils, but has not tried this. Patient reports he overall is not hungry, he drinks a smoothie for breakfast and usually does not eat again until dinner. Patient drinks one bottle or less of water day. He has a redbull in the morning. Patient reports he does not drink enough fluids, he misses coke zero. Patient has not been drinking Glucerna supplements, he is asking for coupons. Patient reports nausea over Thanksgiving. This has resolved. Patient reports increased cold sensitivity after last chemotherapy.    Medications: Oxycodone, Protonix, MVI, Zofran, compazine, insulin NPH, magic mouthwash, Remeron (pt says he is taking)  Labs: Glucose 254, Cr 0.49  Anthropometrics: Weight 138 lb today decreased 6.1% in the last 5 weeks; significant.   10/20 - 147.1 lb 9/22 - 144.5 lb   NUTRITION DIAGNOSIS: Unintended weight loss continues   INTERVENTION: Educated on importance of adequate calories and protein to maintain weights, strength, nutrition  Encouraged high calorie, high protein foods to promote weight gain Discussed eating small frequent meals and snacks every 2-3 hours with focus on protein Patient will resume drinking Glucerna 1.5, recommend 3 day - coupons provided Contact information provided     MONITORING, EVALUATION, GOAL: weight trends, intake   NEXT VISIT: To be scheduled

## 2021-07-31 ENCOUNTER — Other Ambulatory Visit: Payer: Self-pay

## 2021-07-31 ENCOUNTER — Encounter: Payer: Self-pay | Admitting: Hematology and Oncology

## 2021-07-31 ENCOUNTER — Other Ambulatory Visit: Payer: Self-pay | Admitting: Pharmacist

## 2021-07-31 DIAGNOSIS — E118 Type 2 diabetes mellitus with unspecified complications: Secondary | ICD-10-CM

## 2021-07-31 MED ORDER — "INSULIN SYRINGE-NEEDLE U-100 31G X 5/16"" 0.3 ML MISC"
6 refills | Status: DC
  Filled 2021-07-31 – 2021-09-10 (×3): qty 100, 25d supply, fill #0

## 2021-07-31 MED ORDER — SYRINGE (DISPOSABLE) 3 ML MISC
4 refills | Status: DC
  Filled 2021-07-31 (×4): qty 100, fill #0
  Filled 2021-09-10: qty 100, 30d supply, fill #0

## 2021-08-01 ENCOUNTER — Other Ambulatory Visit: Payer: Self-pay | Admitting: Hematology

## 2021-08-01 ENCOUNTER — Other Ambulatory Visit (HOSPITAL_COMMUNITY): Payer: Self-pay

## 2021-08-01 ENCOUNTER — Other Ambulatory Visit: Payer: Self-pay

## 2021-08-01 ENCOUNTER — Other Ambulatory Visit: Payer: Self-pay | Admitting: Hematology and Oncology

## 2021-08-01 ENCOUNTER — Inpatient Hospital Stay: Payer: Medicaid Other | Attending: Hematology and Oncology

## 2021-08-01 VITALS — BP 138/81 | HR 84 | Temp 98.2°F | Resp 18

## 2021-08-01 DIAGNOSIS — Z794 Long term (current) use of insulin: Secondary | ICD-10-CM | POA: Insufficient documentation

## 2021-08-01 DIAGNOSIS — C250 Malignant neoplasm of head of pancreas: Secondary | ICD-10-CM | POA: Insufficient documentation

## 2021-08-01 DIAGNOSIS — Z79899 Other long term (current) drug therapy: Secondary | ICD-10-CM | POA: Diagnosis not present

## 2021-08-01 DIAGNOSIS — E119 Type 2 diabetes mellitus without complications: Secondary | ICD-10-CM | POA: Insufficient documentation

## 2021-08-01 DIAGNOSIS — K2270 Barrett's esophagus without dysplasia: Secondary | ICD-10-CM | POA: Diagnosis not present

## 2021-08-01 DIAGNOSIS — Z5111 Encounter for antineoplastic chemotherapy: Secondary | ICD-10-CM | POA: Diagnosis present

## 2021-08-01 MED ORDER — NYSTATIN 100000 UNIT/ML MT SUSP
OROMUCOSAL | 1 refills | Status: DC
  Filled 2021-08-01: qty 280, 9d supply, fill #0

## 2021-08-01 MED ORDER — SODIUM CHLORIDE 0.9% FLUSH
10.0000 mL | INTRAVENOUS | Status: DC | PRN
  Administered 2021-08-01: 10 mL

## 2021-08-01 MED ORDER — HEPARIN SOD (PORK) LOCK FLUSH 100 UNIT/ML IV SOLN
500.0000 [IU] | Freq: Once | INTRAVENOUS | Status: AC | PRN
  Administered 2021-08-01: 500 [IU]

## 2021-08-04 ENCOUNTER — Encounter: Payer: Self-pay | Admitting: Hematology and Oncology

## 2021-08-04 ENCOUNTER — Telehealth: Payer: Self-pay | Admitting: *Deleted

## 2021-08-04 ENCOUNTER — Other Ambulatory Visit: Payer: Self-pay | Admitting: *Deleted

## 2021-08-04 ENCOUNTER — Other Ambulatory Visit (HOSPITAL_COMMUNITY): Payer: Self-pay

## 2021-08-04 MED ORDER — NYSTATIN 100000 UNIT/ML MT SUSP
OROMUCOSAL | 1 refills | Status: DC
  Filled 2021-08-04: qty 200, 5d supply, fill #0
  Filled 2021-08-18: qty 200, 5d supply, fill #1

## 2021-08-04 MED ORDER — OXYCODONE HCL 5 MG PO TABS
5.0000 mg | ORAL_TABLET | Freq: Four times a day (QID) | ORAL | 0 refills | Status: DC | PRN
  Filled 2021-08-04: qty 60, 15d supply, fill #0

## 2021-08-04 MED ORDER — NYSTATIN 100000 UNIT/ML MT SUSP: OROMUCOSAL | 1 refills | Status: DC

## 2021-08-04 NOTE — Telephone Encounter (Signed)
Received call from patient requesting refill of his Magic Mouthwash and his oxycodone.Oxycodone last filled on 07/14/21 for # 34  Pt also asking about his schedule for chemo. His understanding  is he was to have 8 cycles of chemo and then have surgery. He is concerned that he has 2 more cycles on his appointment schedule. Advised that I would have Dr. Lorenso Courier clarify this.  He states he will need the surgery@ [facility] as it seems it is complicated. He will call Dr. Michaelle Birks @ [facility] to have that appt referral sent.

## 2021-08-05 ENCOUNTER — Other Ambulatory Visit (HOSPITAL_COMMUNITY): Payer: Self-pay

## 2021-08-05 ENCOUNTER — Telehealth: Payer: Self-pay | Admitting: Hematology and Oncology

## 2021-08-05 NOTE — Telephone Encounter (Signed)
Scheduled per 11/30 los, patient has been called and notified of upcoming appointments.

## 2021-08-06 ENCOUNTER — Telehealth: Payer: Self-pay | Admitting: *Deleted

## 2021-08-06 ENCOUNTER — Other Ambulatory Visit (HOSPITAL_COMMUNITY): Payer: Self-pay

## 2021-08-06 ENCOUNTER — Other Ambulatory Visit: Payer: Self-pay

## 2021-08-06 ENCOUNTER — Other Ambulatory Visit: Payer: Self-pay | Admitting: Internal Medicine

## 2021-08-06 ENCOUNTER — Other Ambulatory Visit: Payer: Self-pay | Admitting: Adult Health

## 2021-08-06 DIAGNOSIS — F17200 Nicotine dependence, unspecified, uncomplicated: Secondary | ICD-10-CM

## 2021-08-06 NOTE — Telephone Encounter (Signed)
TCT patient regarding his upcominf chemo appts. Advised that Dr. Lorenso Courier recommends continuing the chemotherapy until his appt with surgeons at [facility] has been made a date for surgery has been set. Advised that we don't want him to go for a prolonged period of time without chemo, even though the standard 8 cycles has been completed.  Pt voiced understanding-disappointed but understands. Encouraged hin, again, to reach out to Dr. Michaelle Birks @ [facility] so that she can do referral to [facility] cancer surgeons.  He said he will. He is aware of his next appts here on the 14th of December

## 2021-08-07 ENCOUNTER — Other Ambulatory Visit: Payer: Self-pay | Admitting: Internal Medicine

## 2021-08-07 ENCOUNTER — Encounter: Payer: Self-pay | Admitting: Hematology and Oncology

## 2021-08-07 ENCOUNTER — Other Ambulatory Visit: Payer: Self-pay | Admitting: Hematology and Oncology

## 2021-08-07 ENCOUNTER — Other Ambulatory Visit: Payer: Self-pay

## 2021-08-07 DIAGNOSIS — C250 Malignant neoplasm of head of pancreas: Secondary | ICD-10-CM

## 2021-08-07 MED ORDER — ONDANSETRON HCL 8 MG PO TABS
8.0000 mg | ORAL_TABLET | Freq: Two times a day (BID) | ORAL | 1 refills | Status: DC | PRN
  Filled 2021-08-07 – 2021-09-10 (×3): qty 30, 15d supply, fill #0
  Filled 2021-10-02: qty 30, 15d supply, fill #1

## 2021-08-07 MED ORDER — LIDOCAINE-PRILOCAINE 2.5-2.5 % EX CREA
1.0000 "application " | TOPICAL_CREAM | CUTANEOUS | 0 refills | Status: DC | PRN
  Filled 2021-08-07: qty 30, 30d supply, fill #0

## 2021-08-07 MED ORDER — PROCHLORPERAZINE MALEATE 10 MG PO TABS
10.0000 mg | ORAL_TABLET | Freq: Four times a day (QID) | ORAL | 1 refills | Status: DC | PRN
  Filled 2021-08-07 – 2021-09-10 (×3): qty 30, 8d supply, fill #0
  Filled 2021-10-02: qty 30, 8d supply, fill #1

## 2021-08-08 ENCOUNTER — Other Ambulatory Visit: Payer: Self-pay

## 2021-08-11 ENCOUNTER — Other Ambulatory Visit: Payer: Self-pay

## 2021-08-12 ENCOUNTER — Telehealth: Payer: Self-pay | Admitting: Physician Assistant

## 2021-08-12 MED FILL — Dexamethasone Sodium Phosphate Inj 100 MG/10ML: INTRAMUSCULAR | Qty: 1 | Status: AC

## 2021-08-12 MED FILL — Fosaprepitant Dimeglumine For IV Infusion 150 MG (Base Eq): INTRAVENOUS | Qty: 5 | Status: AC

## 2021-08-12 NOTE — Telephone Encounter (Signed)
Called patient regarding any upcoming changes to 12/14 appointments.

## 2021-08-13 ENCOUNTER — Other Ambulatory Visit: Payer: Self-pay

## 2021-08-13 ENCOUNTER — Encounter: Payer: Self-pay | Admitting: Nurse Practitioner

## 2021-08-13 ENCOUNTER — Inpatient Hospital Stay: Payer: Medicaid Other

## 2021-08-13 ENCOUNTER — Ambulatory Visit: Payer: Medicaid Other | Admitting: Hematology and Oncology

## 2021-08-13 ENCOUNTER — Inpatient Hospital Stay (HOSPITAL_BASED_OUTPATIENT_CLINIC_OR_DEPARTMENT_OTHER): Payer: Medicaid Other | Admitting: Nurse Practitioner

## 2021-08-13 ENCOUNTER — Inpatient Hospital Stay: Payer: Medicaid Other | Admitting: Physician Assistant

## 2021-08-13 VITALS — BP 148/89 | HR 78 | Temp 98.4°F | Resp 18

## 2021-08-13 DIAGNOSIS — C250 Malignant neoplasm of head of pancreas: Secondary | ICD-10-CM

## 2021-08-13 DIAGNOSIS — Z5111 Encounter for antineoplastic chemotherapy: Secondary | ICD-10-CM | POA: Diagnosis not present

## 2021-08-13 DIAGNOSIS — Z95828 Presence of other vascular implants and grafts: Secondary | ICD-10-CM

## 2021-08-13 LAB — CMP (CANCER CENTER ONLY)
ALT: 12 U/L (ref 0–44)
AST: 25 U/L (ref 15–41)
Albumin: 3.6 g/dL (ref 3.5–5.0)
Alkaline Phosphatase: 80 U/L (ref 38–126)
Anion gap: 7 (ref 5–15)
BUN: 14 mg/dL (ref 6–20)
CO2: 27 mmol/L (ref 22–32)
Calcium: 9.2 mg/dL (ref 8.9–10.3)
Chloride: 106 mmol/L (ref 98–111)
Creatinine: 0.63 mg/dL (ref 0.61–1.24)
GFR, Estimated: >60 mL/min (ref >60)
Glucose, Bld: 124 mg/dL — ABNORMAL HIGH (ref 70–99)
Potassium: 4.5 mmol/L (ref 3.5–5.1)
Sodium: 140 mmol/L (ref 135–145)
Total Bilirubin: 0.3 mg/dL (ref 0.3–1.2)
Total Protein: 7.4 g/dL (ref 6.5–8.1)

## 2021-08-13 LAB — CBC WITH DIFFERENTIAL (CANCER CENTER ONLY)
Abs Immature Granulocytes: 0.01 10*3/uL (ref 0.00–0.07)
Basophils Absolute: 0 10*3/uL (ref 0.0–0.1)
Basophils Relative: 0 %
Eosinophils Absolute: 0.1 10*3/uL (ref 0.0–0.5)
Eosinophils Relative: 1 %
HCT: 35.5 % — ABNORMAL LOW (ref 39.0–52.0)
Hemoglobin: 11.7 g/dL — ABNORMAL LOW (ref 13.0–17.0)
Immature Granulocytes: 0 %
Lymphocytes Relative: 28 %
Lymphs Abs: 2 10*3/uL (ref 0.7–4.0)
MCH: 30.2 pg (ref 26.0–34.0)
MCHC: 33 g/dL (ref 30.0–36.0)
MCV: 91.5 fL (ref 80.0–100.0)
Monocytes Absolute: 1.2 10*3/uL — ABNORMAL HIGH (ref 0.1–1.0)
Monocytes Relative: 17 %
Neutro Abs: 3.9 10*3/uL (ref 1.7–7.7)
Neutrophils Relative %: 54 %
Platelet Count: 94 10*3/uL — ABNORMAL LOW (ref 150–400)
RBC: 3.88 MIL/uL — ABNORMAL LOW (ref 4.22–5.81)
RDW: 18.8 % — ABNORMAL HIGH (ref 11.5–15.5)
WBC Count: 7.2 10*3/uL (ref 4.0–10.5)
nRBC: 0 % (ref 0.0–0.2)

## 2021-08-13 MED ORDER — SODIUM CHLORIDE 0.9 % IV SOLN
130.0000 mg/m2 | Freq: Once | INTRAVENOUS | Status: AC
  Administered 2021-08-13: 16:00:00 240 mg via INTRAVENOUS
  Filled 2021-08-13: qty 12

## 2021-08-13 MED ORDER — DEXAMETHASONE SODIUM PHOSPHATE 100 MG/10ML IJ SOLN
10.0000 mg | Freq: Once | INTRAMUSCULAR | Status: AC
  Administered 2021-08-13: 12:00:00 10 mg via INTRAVENOUS
  Filled 2021-08-13: qty 10

## 2021-08-13 MED ORDER — DEXTROSE 5 % IV SOLN: Freq: Once | INTRAVENOUS | Status: AC

## 2021-08-13 MED ORDER — SODIUM CHLORIDE 0.9% FLUSH
10.0000 mL | Freq: Once | INTRAVENOUS | Status: AC
Start: 2021-08-13 — End: 2021-08-13
  Administered 2021-08-13: 11:00:00 10 mL

## 2021-08-13 MED ORDER — PALONOSETRON HCL INJECTION 0.25 MG/5ML
0.2500 mg | Freq: Once | INTRAVENOUS | Status: AC
  Administered 2021-08-13: 12:00:00 0.25 mg via INTRAVENOUS
  Filled 2021-08-13: qty 5

## 2021-08-13 MED ORDER — SODIUM CHLORIDE 0.9% FLUSH: 10.0000 mL | INTRAVENOUS | Status: DC | PRN

## 2021-08-13 MED ORDER — SODIUM CHLORIDE 0.9 % IV SOLN
150.0000 mg | Freq: Once | INTRAVENOUS | Status: AC
  Administered 2021-08-13: 12:00:00 150 mg via INTRAVENOUS
  Filled 2021-08-13: qty 150

## 2021-08-13 MED ORDER — OXALIPLATIN CHEMO INJECTION 100 MG/20ML
70.0000 mg/m2 | Freq: Once | INTRAVENOUS | Status: AC
  Administered 2021-08-13: 14:00:00 130 mg via INTRAVENOUS
  Filled 2021-08-13: qty 20

## 2021-08-13 MED ORDER — ATROPINE SULFATE 1 MG/ML IV SOLN
0.5000 mg | Freq: Once | INTRAVENOUS | Status: AC | PRN
  Administered 2021-08-13: 16:00:00 0.5 mg via INTRAVENOUS
  Filled 2021-08-13: qty 1

## 2021-08-13 MED ORDER — SODIUM CHLORIDE 0.9 % IV SOLN
400.0000 mg/m2 | Freq: Once | INTRAVENOUS | Status: AC
  Administered 2021-08-13: 16:00:00 748 mg via INTRAVENOUS
  Filled 2021-08-13: qty 25

## 2021-08-13 MED ORDER — SODIUM CHLORIDE 0.9 % IV SOLN
2400.0000 mg/m2 | INTRAVENOUS | Status: DC
  Administered 2021-08-13: 18:00:00 4500 mg via INTRAVENOUS
  Filled 2021-08-13: qty 90

## 2021-08-13 MED ORDER — HEPARIN SOD (PORK) LOCK FLUSH 100 UNIT/ML IV SOLN: 500.0000 [IU] | Freq: Once | INTRAVENOUS | Status: DC | PRN

## 2021-08-13 NOTE — Progress Notes (Signed)
[place]   [phone] [phone]   Clinic Follow up Note   Patient Care Team: [name], NP as PCP - General (Nurse Practitioner) Havery Moros, Carlota Raspberry, MD as Consulting Physician (Gastroenterology) Orson Slick, MD as Consulting Physician (Oncology) Royston Bake, RN as Registered Nurse 08/13/2021  CHIEF COMPLAINT: Follow-up borderline resectable pancreatic cancer  SUMMARY OF ONCOLOGIC HISTORY: Oncology History  Pancreatic cancer ([place])  03/12/2021 Initial Diagnosis   Pancreatic cancer ([place])   03/12/2021 Cancer Staging   Staging form: Exocrine Pancreas, AJCC 8th Edition - Clinical stage from 03/12/2021: Stage IIB (cT3, cN1, cM0) - Signed by Orson Slick, MD on 03/12/2021 Stage prefix: Initial diagnosis    Malignant neoplasm of head of pancreas ([place])  03/12/2021 Initial Diagnosis   Malignant neoplasm of head of pancreas ([place])   04/10/2021 -  Chemotherapy   Patient is on Treatment Plan : PANCREAS Modified FOLFIRINOX q14d x 4 cycles       CURRENT THERAPY: Neoadjuvant FOLFIRINOX q. 14 days, started 04/10/2021  INTERVAL HISTORY: [name] [name] presents for follow-up and treatment as scheduled, seen in the infusion room.  Last seen by Dede Query 07/30/2021 for cycle 8.  Sensitivity last longer now, up to 10 days. He has residual neuropathy in his feet which is chronic and stable.  Since last cycle he has more fatigue, delayed nausea into the second week, and diarrhea sporadically up to 5-6 times per day which is more than usual.  He has a sore red bottom due to frequent wiping, uses Desitin.  He manages n/v/d with Creon, Zofran, Compazine, and Lomotil.  He forces himself to eat and drink. Taste is decreased, he has mouth sores and a dental issue he is interested in seeing Dr. Enrique Sack.  Waiting on a refill of Magic mouthwash.  He continues smoking, has productive cough in the morning and exertional dyspnea.  Denies fever or chills.   Patient recognize Dr. Burr Medico in the infusion room and notes his mother will be a new patient of hers today.  All other systems were reviewed with the patient and are negative.  MEDICAL HISTORY:  Past Medical History:  Diagnosis Date   Barrett's esophagus dx 2016   Bronchitis    Chronic hepatitis C without hepatic coma ([place]) 10/31/2014   Depression    Diabetes ([place])    ED (erectile dysfunction)    GERD (gastroesophageal reflux disease)    Hepatitis C    Hiatal hernia 10/2014   3cm   Neuropathy    S/P transmetatarsal amputation of foot, left ([place]) 11/28/2020    SURGICAL HISTORY: Past Surgical History:  Procedure Laterality Date   AMPUTATION Left 11/15/2020   Procedure: LEFT TRANSMETATARSALS AMPUTATION;  Surgeon: Newt Minion, MD;  Location: [facility];  Service: Orthopedics;  Laterality: Left;   APPENDECTOMY     BILIARY STENT PLACEMENT N/A 02/27/2021   Procedure: BILIARY STENT PLACEMENT;  Surgeon: Milus Banister, MD;  [facility];  Service: Endoscopy;  Laterality: N/A;   BILIARY STENT PLACEMENT N/A 03/27/2021   Procedure: BILIARY STENT PLACEMENT;  Surgeon: Jackquline Denmark, MD;  [facility];  Service: Endoscopy;  Laterality: N/A;   BIOPSY  03/27/2021   Procedure: BIOPSY;  Surgeon: Jackquline Denmark, MD;  [facility];  Service: Endoscopy;;   COLONOSCOPY N/A 02/16/2014   Procedure: COLONOSCOPY;  Surgeon: Gatha Mayer, MD;  [facility];  Service: Endoscopy;  Laterality: N/A;   COLONOSCOPY     ENDOSCOPIC  RETROGRADE CHOLANGIOPANCREATOGRAPHY (ERCP) WITH PROPOFOL N/A 02/27/2021   Procedure: ENDOSCOPIC RETROGRADE CHOLANGIOPANCREATOGRAPHY (ERCP) WITH PROPOFOL;  Surgeon: Milus Banister, MD;  [facility];  Service: Endoscopy;  Laterality: N/A;   ERCP N/A 03/27/2021   Procedure: ENDOSCOPIC RETROGRADE CHOLANGIOPANCREATOGRAPHY (ERCP);  Surgeon: Jackquline Denmark, MD;  Location: [facility];  Service: Endoscopy;  Laterality: N/A;    ESOPHAGOGASTRODUODENOSCOPY (EGD) WITH PROPOFOL N/A 05/06/2021   Procedure: ESOPHAGOGASTRODUODENOSCOPY (EGD) WITH PROPOFOL;  Surgeon: Mauri Pole, MD;  [facility];  Service: Endoscopy;  Laterality: N/A;   EUS N/A 02/27/2021   Procedure: UPPER ENDOSCOPIC ULTRASOUND (EUS) RADIAL;  Surgeon: Milus Banister, MD;  [facility];  Service: Endoscopy;  Laterality: N/A;   FINE NEEDLE ASPIRATION N/A 02/27/2021   Procedure: FINE NEEDLE ASPIRATION (FNA) LINEAR;  Surgeon: Milus Banister, MD;  [facility];  Service: Endoscopy;  Laterality: N/A;   IR IMAGING GUIDED PORT INSERTION  03/21/2021   SPHINCTEROTOMY  02/27/2021   Procedure: SPHINCTEROTOMY;  Surgeon: Milus Banister, MD;  [facility];  Service: Endoscopy;;   STENT REMOVAL  03/27/2021   Procedure: STENT REMOVAL;  Surgeon: Jackquline Denmark, MD;  [facility];  Service: Endoscopy;;   TONSILLECTOMY      I have reviewed the social history and family history with the patient and they are unchanged from previous note.  ALLERGIES:  is allergic to bee venom and lactose intolerance (gi).  MEDICATIONS:  Current Outpatient Medications  Medication Sig Dispense Refill   Accu-Chek Softclix Lancets lancets Check blood glucose level by fingerstick 3-4 times per day. 100 each 3   albuterol (VENTOLIN HFA) 108 (90 Base) MCG/ACT inhaler Inhale 2 puffs into the lungs every 6 (six) hours as needed for wheezing or shortness of breath. 18 g 0   atorvastatin (LIPITOR) 20 MG tablet Take 1 tablet (20 mg total) by mouth daily. 90 tablet 0   Blood Glucose Monitoring Suppl (ACCU-CHEK GUIDE) w/Device KIT Check blood glucose level by fingerstick 3-4 times per day. 1 kit 0   diphenoxylate-atropine (LOMOTIL) 2.5-0.025 MG tablet Take 1 tablet by mouth 4 (four) times daily as needed for diarrhea or loose stools. 60 tablet 1   diphenoxylate-atropine (LOMOTIL) 2.5-0.025 MG tablet Take 1 tablet by mouth 4 times daily as needed for  diarrhea or loose stools. 60 tablet 0   DULoxetine (CYMBALTA) 60 MG capsule Take 1 capsule (60 mg total) by mouth daily. 90 capsule 1   EPINEPHrine 0.3 mg/0.3 mL IJ SOAJ injection Inject 0.3 mg into the muscle once as needed for anaphylaxis.     folic acid (FOLVITE) 1 MG tablet Take 1 tablet (1 mg total) by mouth in the morning.     glucose blood (ACCU-CHEK GUIDE) test strip Check blood glucose level by fingerstick 3-4 times per day. 100 each 12   insulin NPH Human (HUMULIN N) 100 UNIT/ML injection Inject 30 Units into the skin every morning. 30 mL 3   Insulin Pen Needle (TECHLITE PEN NEEDLES) 32G X 4 MM MISC USE AS INSTRUCTED INTO THE SKIN TWICE DAILY 100 each 11   Insulin Pen Needle (TRUEPLUS PEN NEEDLES) 32G X 4 MM MISC Use as instructed. Inject into the skin twice daily 200 each 6   Insulin Syringe-Needle U-100 (TRUEPLUS INSULIN SYRINGE) 31G X 5/16" 0.3 ML MISC Use to inject insulin once daily. 100 each 6   lidocaine-prilocaine (EMLA) cream Apply 1 application topically as directed as needed. 30 g 0   lipase/protease/amylase (CREON) 36000 UNITS CPEP capsule Take  2 capsules (72,000 Units total) by mouth 3 (three) times daily with meals. May also take 1 capsule (36,000 Units total) as needed (with snacks). 210 capsule 3   losartan (COZAAR) 50 MG tablet Take 1 tablet (50 mg total) by mouth daily. 90 tablet 1   magic mouthwash (nystatin, lidocaine, diphenhydrAMINE, alum & mag hydroxide) suspension Swish 77m around mouth & swallow/spit every 6 hours as needed 280 mL 1   magic mouthwash (nystatin, lidocaine, diphenhydrAMINE, alum & mag hydroxide) suspension Swish 565m around mouth & swallow every 6 hours as needed 200 mL 1   metoCLOPramide (REGLAN) 5 MG tablet Take 1 tablet (5 mg total) by mouth 4 (four) times daily -  before meals and at bedtime. 90 tablet 1   mirtazapine (REMERON) 30 MG tablet Take 1 tablet (30 mg total) by mouth at bedtime. 90 tablet 1   Multiple Vitamins-Minerals (THEREMS-M) TABS  Take 1 tablet by mouth in the morning. 90 tablet 3   nystatin-lidocaine-diphenhydrAMINE-alum & mag hydroxide-simeth-sterile water Take 5 mls by mouth four times a day as needed 400 mL 0   ondansetron (ZOFRAN) 8 MG tablet Take 1 tablet (8 mg total) by mouth 2 (two) times daily as needed. Start on day 3 after chemotherapy. 30 tablet 1   oxyCODONE (OXY IR/ROXICODONE) 5 MG immediate release tablet Take 1 tablet (5 mg total) by mouth every 6 (six) hours as needed for severe pain. 60 tablet 0   pantoprazole (PROTONIX) 40 MG tablet Take 1 tablet (40 mg total) by mouth daily. 90 tablet 1   polyethylene glycol powder (GLYCOLAX/MIRALAX) 17 GM/SCOOP powder Take 17 g by mouth at bedtime. 238 g 0   prochlorperazine (COMPAZINE) 10 MG tablet Take 1 tablet (10 mg total) by mouth every 6 (six) hours as needed (Nausea or vomiting). 30 tablet 1   Syringe, Disposable, 3 ML MISC Use As Directed 100 each 4   No current facility-administered medications for this visit.   Facility-Administered Medications Ordered in Other Visits  Medication Dose Route Frequency Provider Last Rate Last Admin   atropine injection 0.5 mg  0.5 mg Intravenous Once PRN DoOrson SlickMD       fluorouracil (ADRUCIL) 4,500 mg in sodium chloride 0.9 % 60 mL chemo infusion  2,400 mg/m2 (Treatment Plan Recorded) Intravenous 1 day or 1 dose DoLedell PeoplesV, MD       heparin lock flush 100 unit/mL  500 Units Intracatheter Once PRN DoOrson SlickMD       irinotecan (CAMPTOSAR) 240 mg in sodium chloride 0.9 % 500 mL chemo infusion  130 mg/m2 (Treatment Plan Recorded) Intravenous Once FeTruitt MerleMD       leucovorin 748 mg in sodium chloride 0.9 % 250 mL infusion  400 mg/m2 (Treatment Plan Recorded) Intravenous Once DoOrson SlickMD       oxaliplatin (ELOXATIN) 130 mg in dextrose 5 % 500 mL chemo infusion  70 mg/m2 (Treatment Plan Recorded) Intravenous Once FeTruitt MerleMD       sodium chloride flush (NS) 0.9 % injection 10 mL  10 mL  Intracatheter PRN DoOrson SlickMD        PHYSICAL EXAMINATION: ECOG PERFORMANCE STATUS: 1-2 See infusion flowsheet for vitals There were no vitals filed for this visit. There were no vitals filed for this visit.  GENERAL:alert, no distress and comfortable SKIN: No rash EYES: sclera clear OROPHARYNX: No thrush or ulcers LUNGS: clear with normal breathing effort HEART:  regular rate & rhythm, no lower extremity edema ABDOMEN:abdomen soft, non-tender and normal bowel sounds.  Erythema along the gluteal cleft without skin breakdown NEURO: alert & oriented x 3 with fluent speech, no focal motor deficits   LABORATORY DATA:  I have reviewed the data as listed CBC Latest Ref Rng & Units 08/13/2021 07/30/2021 07/16/2021  WBC 4.0 - 10.5 K/uL 7.2 7.2 7.9  Hemoglobin 13.0 - 17.0 g/dL 11.7(L) 12.7(L) 12.1(L)  Hematocrit 39.0 - 52.0 % 35.5(L) 37.7(L) 35.7(L)  Platelets 150 - 400 K/uL 94(L) 121(L) 110(L)     CMP Latest Ref Rng & Units 08/13/2021 07/30/2021 07/16/2021  Glucose 70 - 99 mg/dL 124(H) 254(H) 344(H)  BUN 6 - 20 mg/dL 14 10 11   Creatinine 0.61 - 1.24 mg/dL 0.63 0.49(L) 0.74  Sodium 135 - 145 mmol/L 140 135 136  Potassium 3.5 - 5.1 mmol/L 4.5 3.5 3.8  Chloride 98 - 111 mmol/L 106 99 103  CO2 22 - 32 mmol/L 27 25 24   Calcium 8.9 - 10.3 mg/dL 9.2 9.2 9.0  Total Protein 6.5 - 8.1 g/dL 7.4 8.1 7.4  Total Bilirubin 0.3 - 1.2 mg/dL 0.3 0.4 0.4  Alkaline Phos 38 - 126 U/L 80 95 90  AST 15 - 41 U/L 25 26 24   ALT 0 - 44 U/L 12 17 14       RADIOGRAPHIC STUDIES: I have personally reviewed the radiological images as listed and agreed with the findings in the report. No results found.   ASSESSMENT & PLAN: 60 year old male  1. Adenocarcinoma of the Pancreas, Stage IIB -Initially presented with obstructive jaundice, work-up showed pancreatic head mass (4.4 x 3.7 cm) and enlarged portocaval lymph nodes. -02/27/2021 s/p ERCP with biliary stent, FNA of pancreatic head showed  malignant cells consistent with pancreatic adenocarcinoma -03/05/2021 CT chest was negative -Felt to be borderline resectable by Dr. Michaelle Birks -Began neoadjuvant chemo with mFOLFIRINOX on 04/10/2021.  Cycle 3 was delayed 2 weeks due to hospitalization for poor p.o. intake, dehydration, gastroparesis -Recovered and has been tolerating chemo well thus far -Restaging CT 07/15/2021 showed stable disease -He has been referred for a second surgical opinion at [facility], appointment 08/15/2021  2.  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  Treated Hep C, DM, Barrett's esophagus -continue GI and PCP follow-up  Disposition: [name] [name] appears stable.  S/p cycle 8 mFOLFIRINOX, tolerating treatment moderately well.  He has accumulating toxicities including cold sensitivity, fatigue, and mucositis with progressive nausea and diarrhea.  Side effects are adequately managed with supportive care at home.  He is able to recover and function well.  There is no clinical evidence of disease progression.  Labs reviewed, he has worsening thrombocytopenia, platelet 94K, otherwise stable and adequate to proceed with cycle 9 mFOLFIRINOX today as planned.  I recommend to dose reduce oxaliplatin for neuropathy/fatigue and irinotecan for nausea/diarrhea and thrombocytopenia.   He will be seen at [facility] this week for second surgical opinion.  He will return for pump DC on day 3, then follow-up with Dr. Lorenso Courier and next cycle 08/27/2021.   I placed a referral to dentist Dr. Enrique Sack per patient's request.   He knows to call sooner with any new or worsening side effects, fever, or any other concerning changes.  Plan reviewed with Dr. Burr Medico who adjusted chemo dose.  Orders Placed This Encounter  Procedures   Ambulatory referral to Dentistry    Referral Priority:   Routine    Referral Type:   Consultation  Referral Reason:   Specialty Services Required    Requested Specialty:   Dental General Practice     Number of Visits Requested:   1    All questions were answered. The patient knows to call the clinic with any problems, questions or concerns. No barriers to learning was detected. I spent 20 minutes counseling the patient face to face. The total time spent in the appointment was 30 minutes and more than 50% was on counseling and review of test results     Alla Feeling, NP 08/13/21

## 2021-08-13 NOTE — Patient Instructions (Signed)
[facility] ONCOLOGY  Discharge Instructions: Thank you for choosing [facility] to provide your oncology and hematology care.   If you have a lab appointment with the [facility], please go directly to the [facility] and check in at the registration area.   Wear comfortable clothing and clothing appropriate for easy access to any Portacath or PICC line.   We strive to give you quality time with your provider. You may need to reschedule your appointment if you arrive late (15 or more minutes).  Arriving late affects you and other patients whose appointments are after yours.  Also, if you miss three or more appointments without notifying the office, you may be dismissed from the clinic at the providers discretion.      For prescription refill requests, have your pharmacy contact our office and allow 72 hours for refills to be completed.    Today you received the following chemotherapy and/or immunotherapy agents oxaliplatin, leucovorin, irinotecan, fluorourcil.      To help prevent nausea and vomiting after your treatment, we encourage you to take your nausea medication as directed.  BELOW ARE SYMPTOMS THAT SHOULD BE REPORTED IMMEDIATELY: *FEVER GREATER THAN 100.4 F (38 C) OR HIGHER *CHILLS OR SWEATING *NAUSEA AND VOMITING THAT IS NOT CONTROLLED WITH YOUR NAUSEA MEDICATION *UNUSUAL SHORTNESS OF BREATH *UNUSUAL BRUISING OR BLEEDING *URINARY PROBLEMS (pain or burning when urinating, or frequent urination) *BOWEL PROBLEMS (unusual diarrhea, constipation, pain near the anus) TENDERNESS IN MOUTH AND THROAT WITH OR WITHOUT PRESENCE OF ULCERS (sore throat, sores in mouth, or a toothache) UNUSUAL RASH, SWELLING OR PAIN  UNUSUAL VAGINAL DISCHARGE OR ITCHING   Items with * indicate a potential emergency and should be followed up as soon as possible or go to the Emergency Department if any problems should occur.  Please show the CHEMOTHERAPY ALERT CARD or  IMMUNOTHERAPY ALERT CARD at check-in to the Emergency Department and triage nurse.  Should you have questions after your visit or need to cancel or reschedule your appointment, please contact [facility]  Dept: [phone]  and follow the prompts.  Office hours are 8:00 a.m. to 4:30 p.m. Monday - Friday. Please note that voicemails left after 4:00 p.m. may not be returned until the following business day.  We are closed weekends and major holidays. You have access to a nurse at all times for urgent questions. Please call the main number to the clinic Dept: [phone] and follow the prompts.   For any non-urgent questions, you may also contact your provider using MyChart. We now offer e-Visits for anyone 25 and older to request care online for non-urgent symptoms. For details visit [email or web address].   Also download the MyChart app! Go to the app store, search "MyChart", open the app, select [facility], and log in with your MyChart username and password.  Due to Covid, a mask is required upon entering the hospital/clinic. If you do not have a mask, one will be given to you upon arrival. For doctor visits, patients may have 1 support person aged 16 or older with them. For treatment visits, patients cannot have anyone with them due to current Covid guidelines and our immunocompromised population.

## 2021-08-13 NOTE — Progress Notes (Signed)
Ok to tx with plt = 94k.  Decreasing oxali and CPT-11 doses per Regan Rakers, NP

## 2021-08-15 ENCOUNTER — Inpatient Hospital Stay: Payer: Medicaid Other

## 2021-08-15 ENCOUNTER — Other Ambulatory Visit: Payer: Self-pay

## 2021-08-15 VITALS — BP 131/77 | HR 73 | Temp 98.4°F | Resp 18

## 2021-08-15 DIAGNOSIS — Z5111 Encounter for antineoplastic chemotherapy: Secondary | ICD-10-CM | POA: Diagnosis not present

## 2021-08-15 DIAGNOSIS — Z95828 Presence of other vascular implants and grafts: Secondary | ICD-10-CM

## 2021-08-15 MED ORDER — HEPARIN SOD (PORK) LOCK FLUSH 100 UNIT/ML IV SOLN
500.0000 [IU] | Freq: Once | INTRAVENOUS | Status: AC
  Administered 2021-08-15: 500 [IU]

## 2021-08-15 MED ORDER — SODIUM CHLORIDE 0.9% FLUSH
10.0000 mL | Freq: Once | INTRAVENOUS | Status: AC
  Administered 2021-08-15: 10 mL

## 2021-08-18 ENCOUNTER — Other Ambulatory Visit (HOSPITAL_COMMUNITY): Payer: Self-pay

## 2021-08-18 ENCOUNTER — Other Ambulatory Visit: Payer: Self-pay

## 2021-08-19 ENCOUNTER — Other Ambulatory Visit (HOSPITAL_COMMUNITY): Payer: Self-pay

## 2021-08-21 ENCOUNTER — Other Ambulatory Visit: Payer: Self-pay | Admitting: Hematology and Oncology

## 2021-08-21 ENCOUNTER — Encounter: Payer: Self-pay | Admitting: Nurse Practitioner

## 2021-08-22 ENCOUNTER — Encounter: Payer: Self-pay | Admitting: Hematology and Oncology

## 2021-08-22 ENCOUNTER — Other Ambulatory Visit (HOSPITAL_COMMUNITY): Payer: Self-pay

## 2021-08-22 MED ORDER — OXYCODONE HCL 5 MG PO TABS
5.0000 mg | ORAL_TABLET | Freq: Four times a day (QID) | ORAL | 0 refills | Status: DC | PRN
  Filled 2021-08-22: qty 60, 20d supply, fill #0

## 2021-08-26 ENCOUNTER — Other Ambulatory Visit (HOSPITAL_COMMUNITY): Payer: Self-pay

## 2021-08-26 MED ORDER — VARENICLINE TARTRATE 1 MG PO TABS
ORAL_TABLET | ORAL | 1 refills | Status: DC
  Filled 2021-08-26: qty 30, 30d supply, fill #0
  Filled 2021-09-08 – 2021-09-30 (×2): qty 30, 30d supply, fill #1
  Filled 2021-10-07: qty 30, 30d supply, fill #0
  Filled 2021-10-07: qty 56, 34d supply, fill #0
  Filled 2021-11-07: qty 30, 30d supply, fill #1
  Filled 2021-11-30: qty 30, 30d supply, fill #2
  Filled 2022-02-09: qty 30, 30d supply, fill #3
  Filled 2022-03-11: qty 18, 18d supply, fill #4

## 2021-08-27 ENCOUNTER — Inpatient Hospital Stay: Payer: Medicaid Other

## 2021-08-27 ENCOUNTER — Other Ambulatory Visit: Payer: Self-pay

## 2021-08-27 ENCOUNTER — Inpatient Hospital Stay (HOSPITAL_BASED_OUTPATIENT_CLINIC_OR_DEPARTMENT_OTHER): Payer: Medicaid Other | Admitting: Hematology and Oncology

## 2021-08-27 VITALS — HR 97

## 2021-08-27 VITALS — BP 158/91 | HR 108 | Temp 97.7°F | Resp 18 | Ht 70.0 in | Wt 142.2 lb

## 2021-08-27 DIAGNOSIS — K8689 Other specified diseases of pancreas: Secondary | ICD-10-CM

## 2021-08-27 DIAGNOSIS — Z95828 Presence of other vascular implants and grafts: Secondary | ICD-10-CM

## 2021-08-27 DIAGNOSIS — C250 Malignant neoplasm of head of pancreas: Secondary | ICD-10-CM

## 2021-08-27 DIAGNOSIS — Z5111 Encounter for antineoplastic chemotherapy: Secondary | ICD-10-CM | POA: Diagnosis not present

## 2021-08-27 LAB — CBC WITH DIFFERENTIAL (CANCER CENTER ONLY)
Abs Immature Granulocytes: 0.02 10*3/uL (ref 0.00–0.07)
Basophils Absolute: 0.1 10*3/uL (ref 0.0–0.1)
Basophils Relative: 1 %
Eosinophils Absolute: 0.2 10*3/uL (ref 0.0–0.5)
Eosinophils Relative: 2 %
HCT: 36.2 % — ABNORMAL LOW (ref 39.0–52.0)
Hemoglobin: 12.4 g/dL — ABNORMAL LOW (ref 13.0–17.0)
Immature Granulocytes: 0 %
Lymphocytes Relative: 27 %
Lymphs Abs: 2.1 10*3/uL (ref 0.7–4.0)
MCH: 31.6 pg (ref 26.0–34.0)
MCHC: 34.3 g/dL (ref 30.0–36.0)
MCV: 92.1 fL (ref 80.0–100.0)
Monocytes Absolute: 1.7 10*3/uL — ABNORMAL HIGH (ref 0.1–1.0)
Monocytes Relative: 21 %
Neutro Abs: 3.8 10*3/uL (ref 1.7–7.7)
Neutrophils Relative %: 49 %
Platelet Count: 121 10*3/uL — ABNORMAL LOW (ref 150–400)
RBC: 3.93 MIL/uL — ABNORMAL LOW (ref 4.22–5.81)
RDW: 18.6 % — ABNORMAL HIGH (ref 11.5–15.5)
WBC Count: 7.8 10*3/uL (ref 4.0–10.5)
nRBC: 0 % (ref 0.0–0.2)

## 2021-08-27 LAB — CMP (CANCER CENTER ONLY)
ALT: 14 U/L (ref 0–44)
AST: 34 U/L (ref 15–41)
Albumin: 4.1 g/dL (ref 3.5–5.0)
Alkaline Phosphatase: 85 U/L (ref 38–126)
Anion gap: 8 (ref 5–15)
BUN: 7 mg/dL (ref 6–20)
CO2: 26 mmol/L (ref 22–32)
Calcium: 9.2 mg/dL (ref 8.9–10.3)
Chloride: 105 mmol/L (ref 98–111)
Creatinine: 0.6 mg/dL — ABNORMAL LOW (ref 0.61–1.24)
GFR, Estimated: >60 mL/min (ref >60)
Glucose, Bld: 166 mg/dL — ABNORMAL HIGH (ref 70–99)
Potassium: 3.8 mmol/L (ref 3.5–5.1)
Sodium: 139 mmol/L (ref 135–145)
Total Bilirubin: 0.3 mg/dL (ref 0.3–1.2)
Total Protein: 7.8 g/dL (ref 6.5–8.1)

## 2021-08-27 MED ORDER — SODIUM CHLORIDE 0.9% FLUSH: 10.0000 mL | INTRAVENOUS | Status: DC | PRN

## 2021-08-27 MED ORDER — ATROPINE SULFATE 1 MG/ML IV SOLN
0.5000 mg | Freq: Once | INTRAVENOUS | Status: AC | PRN
  Administered 2021-08-27: 13:00:00 0.5 mg via INTRAVENOUS
  Filled 2021-08-27: qty 1

## 2021-08-27 MED ORDER — DEXTROSE 5 % IV SOLN: Freq: Once | INTRAVENOUS | Status: AC

## 2021-08-27 MED ORDER — SODIUM CHLORIDE 0.9 % IV SOLN
150.0000 mg/m2 | Freq: Once | INTRAVENOUS | Status: AC
  Administered 2021-08-27: 13:00:00 280 mg via INTRAVENOUS
  Filled 2021-08-27: qty 14

## 2021-08-27 MED ORDER — SODIUM CHLORIDE 0.9 % IV SOLN
10.0000 mg | Freq: Once | INTRAVENOUS | Status: AC
  Administered 2021-08-27: 10:00:00 10 mg via INTRAVENOUS
  Filled 2021-08-27: qty 10

## 2021-08-27 MED ORDER — PALONOSETRON HCL INJECTION 0.25 MG/5ML
0.2500 mg | Freq: Once | INTRAVENOUS | Status: AC
  Administered 2021-08-27: 10:00:00 0.25 mg via INTRAVENOUS
  Filled 2021-08-27: qty 5

## 2021-08-27 MED ORDER — OXALIPLATIN CHEMO INJECTION 100 MG/20ML
85.0000 mg/m2 | Freq: Once | INTRAVENOUS | Status: AC
  Administered 2021-08-27: 11:00:00 160 mg via INTRAVENOUS
  Filled 2021-08-27: qty 32

## 2021-08-27 MED ORDER — SODIUM CHLORIDE 0.9 % IV SOLN
150.0000 mg | Freq: Once | INTRAVENOUS | Status: AC
  Administered 2021-08-27: 10:00:00 150 mg via INTRAVENOUS
  Filled 2021-08-27: qty 150

## 2021-08-27 MED ORDER — SODIUM CHLORIDE 0.9% FLUSH
10.0000 mL | Freq: Once | INTRAVENOUS | Status: AC
  Administered 2021-08-27: 08:00:00 10 mL

## 2021-08-27 MED ORDER — SODIUM CHLORIDE 0.9 % IV SOLN
400.0000 mg/m2 | Freq: Once | INTRAVENOUS | Status: AC
  Administered 2021-08-27: 13:00:00 748 mg via INTRAVENOUS
  Filled 2021-08-27: qty 25

## 2021-08-27 MED ORDER — SODIUM CHLORIDE 0.9 % IV SOLN
2400.0000 mg/m2 | INTRAVENOUS | Status: DC
  Administered 2021-08-27: 14:00:00 4500 mg via INTRAVENOUS
  Filled 2021-08-27: qty 90

## 2021-08-27 NOTE — Progress Notes (Signed)
Confirmed doses of Oxaliplatin and Irinotecan w/ Dr. Lorenso Courier today & he wants those to be increased back to "standard levels."  Kennith Center, Pharm.D., CPP 08/27/2021@9 :36 AM

## 2021-08-27 NOTE — Patient Instructions (Addendum)
[facility] ONCOLOG[facility]   Discharge Instructions: Thank you for choosing [facility] to provide your oncology and hematology care.   If you have a lab appointment with the [facility], please go directly to the [facility] and check in at the registration area.   Wear comfortable clothing and clothing appropriate for easy access to any Portacath or PICC line.   We strive to give you quality time with your provider. You may need to reschedule your appointment if you arrive late (15 or more minutes).  Arriving late affects you and other patients whose appointments are after yours.  Also, if you miss three or more appointments without notifying the office, you may be dismissed from the clinic at the providers discretion.      For prescription refill requests, have your pharmacy contact our office and allow 72 hours for refills to be completed.    Today you received the following chemotherapy and/or immunotherapy agents oxaliplatin, leucovorin, irinotecan, fluorourcil.      To help prevent nausea and vomiting after your treatment, we encourage you to take your nausea medication as directed.  BELOW ARE SYMPTOMS THAT SHOULD BE REPORTED IMMEDIATELY: *FEVER GREATER THAN 100.4 F (38 C) OR HIGHER *CHILLS OR SWEATING *NAUSEA AND VOMITING THAT IS NOT CONTROLLED WITH YOUR NAUSEA MEDICATION *UNUSUAL SHORTNESS OF BREATH *UNUSUAL BRUISING OR BLEEDING *URINARY PROBLEMS (pain or burning when urinating, or frequent urination) *BOWEL PROBLEMS (unusual diarrhea, constipation, pain near the anus) TENDERNESS IN MOUTH AND THROAT WITH OR WITHOUT PRESENCE OF ULCERS (sore throat, sores in mouth, or a toothache) UNUSUAL RASH, SWELLING OR PAIN  UNUSUAL VAGINAL DISCHARGE OR ITCHING   Items with * indicate a potential emergency and should be followed up as soon as possible or go to the Emergency Department if any problems should occur.  Please show the CHEMOTHERAPY ALERT CARD or  IMMUNOTHERAPY ALERT CARD at check-in to the Emergency Department and triage nurse.  Should you have questions after your visit or need to cancel or reschedule your appointment, please contact [employer]  Dept: [phone]  and follow the prompts.  Office hours are 8:00 a.m. to 4:30 p.m. Monday - Friday. Please note that voicemails left after 4:00 p.m. may not be returned until the following business day.  We are closed weekends and major holidays. You have access to a nurse at all times for urgent questions. Please call the main number to the clinic Dept: [phone] and follow the prompts.   For any non-urgent questions, you may also contact your provider using MyChart. We now offer e-Visits for anyone 59 and older to request care online for non-urgent symptoms. For details visit [email or web address].   Also download the MyChart app! Go to the app store, search "MyChart", open the app, select [facility], and log in with your MyChart username and password.  Due to Covid, a mask is required upon entering the hospital/clinic. If you do not have a mask, one will be given to you upon arrival. For doctor visits, patients may have 1 support person aged 27 or older with them. For treatment visits, patients cannot have anyone with them due to current Covid guidelines and our immunocompromised population.

## 2021-08-27 NOTE — Progress Notes (Signed)
[place] [phone]   [phone]  PROGRESS NOTE  Patient Care Team: [name], NP as PCP - General (Nurse Practitioner) Havery Moros, Carlota Raspberry, MD as Consulting Physician (Gastroenterology) Orson Slick, MD as Consulting Physician (Oncology) Royston Bake, RN as Registered Nurse  Hematological/Oncological History # Adenocarcinoma of the Pancreas, Stage IIB 02/26/2021: patient seen by GI for jaundice and pancreatic mass causing CBD obstruction. CT abdomen showed pancreatic head mass (4.4 x 3.7 cm) and enlarged portocaval lymph nodes. 02/27/2021: ERCP with placement of a nonmetallic biliary stent. FNA of pancreatic head showed malignant cells consistent with pancreatic adenocarcinoma. 03/05/2021: CT Chest showed no evidence of metastatic disease in the chest. 03/12/2021: establish care with Dr. Lorenso Courier 04/10/2021: Cycle 1 Day 1 of FOLFIRINOX 04/24/2021: Cycle 2 Day 1 of FOLFIRINOX 05/22/2021: Cycle 3 Day 1 of FOLFIRINOX. Delayed x 2 weeks due to hospitalization for poor po intake and dehydration due to gastroparesis. 06/05/2021: Cycle 4 Day 1 of FOLFIRINOX 06/19/2021: Cycle 5 Day 1 of FOLFIRINOX 07/02/2021: Cycle 6 Day 1 of FOLFIRINOX 07/16/2021: Cycle 7 Day 1 of FOLFIRINOX  07/30/2021: Cycle 8 Day 1 of FOLFIRINOX  08/13/2021: Cycle 9 Day 1 of FOLFIRINOX  08/27/2021:  Cycle 10 Day 1 of FOLFIRINOX   Interval History:  [name] [name] 60 y.o. male with medical history significant for borderline resectable pancreatic cancer presents for a follow up visit. The patient's last visit was on 08/13/2021. He presents to start cycle 10 of FOLFIRINOX.  On exam today [name] [name] reports he has been well in the interim since our last visit.  He did unfortunately have large bouts of diarrhea over the weekend which he thinks may have been due to a big spaghetti meal that he cooked for himself.  He reports he had 4-8 bowel movements at the time.  He was taken  Lomotil every 6 hours which did effectively slow down.  He also did have an episode where his blood sugars drop below 60 and he was getting a "jittery feeling".  He notes otherwise he has been okay.  He does endorse having cold intolerance was last for approximately 3 to 5 days after his last treatment.  His appetite is been good and he has had a little bit of nausea.  Overall he is willing and able to proceed with treatment at this time.Marland Kitchen  He denies any fevers, chills, night sweats, shortness of breath or chest pain.  He has no other complaints. Full 10 point ROS is listed below.    MEDICAL HISTORY:  Past Medical History:  Diagnosis Date   Barrett's esophagus dx 2016   Bronchitis    Chronic hepatitis C without hepatic coma ([place]) 10/31/2014   Depression    Diabetes ([place])    ED (erectile dysfunction)    GERD (gastroesophageal reflux disease)    Hepatitis C    Hiatal hernia 10/2014   3cm   Neuropathy    S/P transmetatarsal amputation of foot, left ([place]) 11/28/2020    SURGICAL HISTORY: Past Surgical History:  Procedure Laterality Date   AMPUTATION Left 11/15/2020   Procedure: LEFT TRANSMETATARSALS AMPUTATION;  Surgeon: Newt Minion, MD;  Location: [facility];  Service: Orthopedics;  Laterality: Left;   APPENDECTOMY     BILIARY STENT PLACEMENT N/A 02/27/2021   Procedure: BILIARY STENT PLACEMENT;  Surgeon: Milus Banister, MD;  Location: [facility];  Service: Endoscopy;  Laterality: N/A;   BILIARY STENT PLACEMENT N/A 03/27/2021   Procedure: BILIARY STENT  PLACEMENT;  Surgeon: Jackquline Denmark, MD;  Location: [facility];  Service: Endoscopy;  Laterality: N/A;   BIOPSY  03/27/2021   Procedure: BIOPSY;  Surgeon: Jackquline Denmark, MD;  Location: [facility];  Service: Endoscopy;;   COLONOSCOPY N/A 02/16/2014   Procedure: COLONOSCOPY;  Surgeon: Gatha Mayer, MD;  Location: [facility];  Service: Endoscopy;  Laterality: N/A;   COLONOSCOPY     ENDOSCOPIC RETROGRADE CHOLANGIOPANCREATOGRAPHY (ERCP) WITH  PROPOFOL N/A 02/27/2021   Procedure: ENDOSCOPIC RETROGRADE CHOLANGIOPANCREATOGRAPHY (ERCP) WITH PROPOFOL;  Surgeon: Milus Banister, MD;  Location: [facility];  Service: Endoscopy;  Laterality: N/A;   ERCP N/A 03/27/2021   Procedure: ENDOSCOPIC RETROGRADE CHOLANGIOPANCREATOGRAPHY (ERCP);  Surgeon: Jackquline Denmark, MD;  Location: [facility];  Service: Endoscopy;  Laterality: N/A;   ESOPHAGOGASTRODUODENOSCOPY (EGD) WITH PROPOFOL N/A 05/06/2021   Procedure: ESOPHAGOGASTRODUODENOSCOPY (EGD) WITH PROPOFOL;  Surgeon: Mauri Pole, MD;  Location: [facility];  Service: Endoscopy;  Laterality: N/A;   EUS N/A 02/27/2021   Procedure: UPPER ENDOSCOPIC ULTRASOUND (EUS) RADIAL;  Surgeon: Milus Banister, MD;  Location: [facility];  Service: Endoscopy;  Laterality: N/A;   FINE NEEDLE ASPIRATION N/A 02/27/2021   Procedure: FINE NEEDLE ASPIRATION (FNA) LINEAR;  Surgeon: Milus Banister, MD;  Location: [facility];  Service: Endoscopy;  Laterality: N/A;   IR IMAGING GUIDED PORT INSERTION  03/21/2021   SPHINCTEROTOMY  02/27/2021   Procedure: SPHINCTEROTOMY;  Surgeon: Milus Banister, MD;  Location: [facility];  Service: Endoscopy;;   STENT REMOVAL  03/27/2021   Procedure: Lavell Islam REMOVAL;  Surgeon: Jackquline Denmark, MD;  Location: [facility];  Service: Endoscopy;;   TONSILLECTOMY      SOCIAL HISTORY: Social History   Socioeconomic History   Marital status: Single    Spouse name: Not on file   Number of children: Not on file   Years of education: Not on file   Highest education level: Not on file  Occupational History   Not on file  Tobacco Use   Smoking status: Every Day    Packs/day: 0.50    Years: 35.00    Pack years: 17.50    Types: Cigarettes   Smokeless tobacco: Never  Vaping Use   Vaping Use: Never used  Substance and Sexual Activity   Alcohol use: Not Currently    Comment: previous   Drug use: No   Sexual activity: Not on file  Other Topics Concern   Not on file  Social  History Narrative   Not on file   Social Determinants of Health   Financial Resource Strain: High Risk   Difficulty of Paying Living Expenses: Very hard  Food Insecurity: Food Insecurity Present   Worried About Charity fundraiser in the Last Year: Sometimes true   Arboriculturist in the Last Year: Sometimes true  Transportation Needs: Unmet Transportation Needs   Lack of Transportation (Medical): Yes   Lack of Transportation (Non-Medical): Yes  Physical Activity: Not on file  Stress: Not on file  Social Connections: Not on file  Intimate Partner Violence: Not on file    FAMILY HISTORY: Family History  Problem Relation Age of Onset   Breast cancer Mother    Stroke Father    Alcohol abuse Father    Heart disease Maternal Grandfather    Pancreatic cancer Paternal Grandmother    Diabetes Paternal Grandfather    Colon cancer Neg Hx    Stomach cancer Neg Hx    Rectal cancer Neg Hx    Esophageal cancer  Neg Hx     ALLERGIES:  is allergic to bee venom and lactose intolerance (gi).  MEDICATIONS:  Current Outpatient Medications  Medication Sig Dispense Refill   Accu-Chek Softclix Lancets lancets Check blood glucose level by fingerstick 3-4 times per day. 100 each 3   albuterol (VENTOLIN HFA) 108 (90 Base) MCG/ACT inhaler Inhale 2 puffs into the lungs every 6 (six) hours as needed for wheezing or shortness of breath. 18 g 0   atorvastatin (LIPITOR) 20 MG tablet Take 1 tablet (20 mg total) by mouth daily. 90 tablet 0   Blood Glucose Monitoring Suppl (ACCU-CHEK GUIDE) w/Device KIT Check blood glucose level by fingerstick 3-4 times per day. 1 kit 0   diphenoxylate-atropine (LOMOTIL) 2.5-0.025 MG tablet Take 1 tablet by mouth 4 (four) times daily as needed for diarrhea or loose stools. 60 tablet 1   diphenoxylate-atropine (LOMOTIL) 2.5-0.025 MG tablet Take 1 tablet by mouth 4 times daily as needed for diarrhea or loose stools. 60 tablet 0   DULoxetine (CYMBALTA) 60 MG capsule Take 1  capsule (60 mg total) by mouth daily. 90 capsule 1   EPINEPHrine 0.3 mg/0.3 mL IJ SOAJ injection Inject 0.3 mg into the muscle once as needed for anaphylaxis.     folic acid (FOLVITE) 1 MG tablet Take 1 tablet (1 mg total) by mouth in the morning.     glucose blood (ACCU-CHEK GUIDE) test strip Check blood glucose level by fingerstick 3-4 times per day. 100 each 12   insulin NPH Human (HUMULIN N) 100 UNIT/ML injection Inject 30 Units into the skin every morning. 30 mL 3   Insulin Pen Needle (TECHLITE PEN NEEDLES) 32G X 4 MM MISC USE AS INSTRUCTED INTO THE SKIN TWICE DAILY 100 each 11   Insulin Pen Needle (TRUEPLUS PEN NEEDLES) 32G X 4 MM MISC Use as instructed. Inject into the skin twice daily 200 each 6   Insulin Syringe-Needle U-100 (TRUEPLUS INSULIN SYRINGE) 31G X 5/16" 0.3 ML MISC Use to inject insulin once daily. 100 each 6   lidocaine-prilocaine (EMLA) cream Apply 1 application topically as directed as needed. 30 g 0   lipase/protease/amylase (CREON) 36000 UNITS CPEP capsule Take 2 capsules (72,000 Units total) by mouth 3 (three) times daily with meals. May also take 1 capsule (36,000 Units total) as needed (with snacks). 210 capsule 3   losartan (COZAAR) 50 MG tablet Take 1 tablet (50 mg total) by mouth daily. 90 tablet 1   magic mouthwash (nystatin, lidocaine, diphenhydrAMINE, alum & mag hydroxide) suspension Swish 70m around mouth & swallow/spit every 6 hours as needed 280 mL 1   magic mouthwash (nystatin, lidocaine, diphenhydrAMINE, alum & mag hydroxide) suspension Swish 527m around mouth & swallow every 6 hours as needed 200 mL 1   metoCLOPramide (REGLAN) 5 MG tablet Take 1 tablet (5 mg total) by mouth 4 (four) times daily -  before meals and at bedtime. 90 tablet 1   mirtazapine (REMERON) 30 MG tablet Take 1 tablet (30 mg total) by mouth at bedtime. 90 tablet 1   Multiple Vitamins-Minerals (THEREMS-M) TABS Take 1 tablet by mouth in the morning. 90 tablet 3    nystatin-lidocaine-diphenhydrAMINE-alum & mag hydroxide-simeth-sterile water Take 5 mls by mouth four times a day as needed 400 mL 0   ondansetron (ZOFRAN) 8 MG tablet Take 1 tablet (8 mg total) by mouth 2 (two) times daily as needed. Start on day 3 after chemotherapy. 30 tablet 1   oxyCODONE (OXY IR/ROXICODONE) 5 MG immediate release tablet  Take 1 tablet (5 mg total) by mouth every 6 (six) hours as needed for severe pain. 60 tablet 0   pantoprazole (PROTONIX) 40 MG tablet Take 1 tablet (40 mg total) by mouth daily. 90 tablet 1   polyethylene glycol powder (GLYCOLAX/MIRALAX) 17 GM/SCOOP powder Take 17 g by mouth at bedtime. 238 g 0   prochlorperazine (COMPAZINE) 10 MG tablet Take 1 tablet (10 mg total) by mouth every 6 (six) hours as needed (Nausea or vomiting). 30 tablet 1   Syringe, Disposable, 3 ML MISC Use As Directed 100 each 4   varenicline (APO-VARENICLINE) 1 MG tablet Take 1 tablet by mouth once daily in the morning with food 84 tablet 1   No current facility-administered medications for this visit.    REVIEW OF SYSTEMS:   Constitutional: ( - ) fevers, ( - )  chills , ( - ) night sweats Eyes: ( - ) blurriness of vision, ( - ) double vision, ( - ) watery eyes Ears, nose, mouth, throat, and face: ( - ) mucositis, ( - ) sore throat Respiratory: ( - ) cough, ( - ) dyspnea, ( - ) wheezes Cardiovascular: ( - ) palpitation, ( - ) chest discomfort, ( - ) lower extremity swelling Gastrointestinal:  ( +) nausea, ( - ) heartburn, ( - ) change in bowel habits Skin: ( - ) abnormal skin rashes Lymphatics: ( - ) new lymphadenopathy, ( - ) easy bruising Neurological: ( - ) numbness, ( - ) tingling, ( - ) new weaknesses Behavioral/Psych: ( - ) mood change, ( - ) new changes  All other systems were reviewed with the patient and are negative.  PHYSICAL EXAMINATION: ECOG PERFORMANCE STATUS: 1 - Symptomatic but completely ambulatory  Vitals:   08/27/21 0802  BP: (!) 158/91  Pulse: (!) 108  Resp:  18  Temp: 97.7 F (36.5 C)  SpO2: 100%      Filed Weights   08/27/21 0802  Weight: 142 lb 3.2 oz (64.5 kg)   GENERAL: Well-appearing middle-age Caucasian male, alert, no distress and comfortable SKIN: skin color, texture, turgor are normal, no rashes or significant lesions.  EYES: conjunctiva are pink and non-injected, sclera clear LUNGS: clear to auscultation and percussion with normal breathing effort HEART: regular rate & rhythm and no murmurs and no lower extremity edema ABDOMEN: mild tenderness to palpation in the epigastric region. No guarding.  PSYCH: alert & oriented x 3, fluent speech NEURO: no focal motor/sensory deficits  LABORATORY DATA:  I have reviewed the data as listed CBC Latest Ref Rng & Units 08/27/2021 08/13/2021 07/30/2021  WBC 4.0 - 10.5 K/uL 7.8 7.2 7.2  Hemoglobin 13.0 - 17.0 g/dL 12.4(L) 11.7(L) 12.7(L)  Hematocrit 39.0 - 52.0 % 36.2(L) 35.5(L) 37.7(L)  Platelets 150 - 400 K/uL 121(L) 94(L) 121(L)    CMP Latest Ref Rng & Units 08/27/2021 08/13/2021 07/30/2021  Glucose 70 - 99 mg/dL 166(H) 124(H) 254(H)  BUN 6 - 20 mg/dL '7 14 10  ' Creatinine 0.61 - 1.24 mg/dL 0.60(L) 0.63 0.49(L)  Sodium 135 - 145 mmol/L 139 140 135  Potassium 3.5 - 5.1 mmol/L 3.8 4.5 3.5  Chloride 98 - 111 mmol/L 105 106 99  CO2 22 - 32 mmol/L '26 27 25  ' Calcium 8.9 - 10.3 mg/dL 9.2 9.2 9.2  Total Protein 6.5 - 8.1 g/dL 7.8 7.4 8.1  Total Bilirubin 0.3 - 1.2 mg/dL 0.3 0.3 0.4  Alkaline Phos 38 - 126 U/L 85 80 95  AST 15 - 41 U/L  34 25 26  ALT 0 - 44 U/L '14 12 17    ' RADIOGRAPHIC STUDIES: I have personally reviewed the radiological images as listed and agreed with the findings in the report: Borderline resectable pancreatic mass with no evidence of metastatic disease. No results found.  ASSESSMENT & PLAN [name] [name] 60 y.o. male with medical history significant for borderline resectable pancreatic cancer presents for a follow up visit.   After review of the labs, review of  the records, and discussion with the patient the patients findings are most consistent with locoregional adenocarcinoma of the pancreas.  The patient does still have an elevation in bilirubin which likely represents a decline from the time of the latest stent placement.  He was evaluated by surgery who determined this is a borderline resectable tumor.  We will plan to start the patient on neoadjuvant chemotherapy.  The 2 options would be Gem/Abraxane or FOLFIRINOX.  Given his excellent functional status I do believe he would be a good candidate for FOLFIRINOX.   # Adenocarcinoma of the Pancreas, Stage IIB -- At this time disease appears borderline resectable.  We will proceed with neoadjuvant chemotherapy.  At this time would prefer a regimen of FOLFIRINOX given his good baseline health. -- case reviewed by surgery, who agrees his disease is borderline resectable.  -- baseline elevations in both CA 19-9 and CEA. Continue to monitor during treatment.  -- Cycle 1 Day 1 of FOLFIRINOX started 04/10/2021 Plan:  --Most recent CT scans from 07/15/2021 showed primary tumor is stable in size but still abuts the SMV.  --Dr. Zenia Resides has referred patient to surgical oncology at [facility], evaluated by Dr. Hyman Hopes who would like for patient to complete full 6 months of treatment prior to surgery.  --labs from today were reviewed, adequate to proceed with treatment today --proceed with Cycle 10, Day 1 of treatment today.  -- RTC in 2 weeks for Cycle 11 of treatment  #Gastroparesis #Nausea/Vomiting/Poor PO Intake --patient advanced diet to solid foods.  --nausea meds as below. --continue to work on glycemic control --continue to monitor.   #Chemotherapy induced Anemia and Thrombocytopenia --Hgb is stable at 12.4, continue to monitor --Plt decreased to 121K, no signs of bleeding. --ok to proceed with treatment --continue to monitor   # Pain Control -- Patient currently taking ibuprofen for his abdominal  pain --continue oxycodone 5 to 10 mg every 6 hours as needed  #Hyperglycemia: --today's glucose level is 166.  --insulin-requiring type 2 DM --under the care of endocrinology  #Supportive Care -- chemotherapy education complete -- port placed -- zofran 8m q8H PRN and compazine 163mPO q6H for nausea --trial of Creon called into pharmacy to help with loose stools. --lomotil q 6 H PRN for diarrhea.  -- EMLA cream for port  No orders of the defined types were placed in this encounter.   All questions were answered. The patient knows to call the clinic with any problems, questions or concerns.  I have spent a total of 30 minutes minutes of face-to-face and non-face-to-face time, preparing to see the patient, performing a medically appropriate examination, counseling and educating the patient, documenting clinical information in the electronic health record, independently interpreting results and communicating results to the patient, and care coordination.   JoLedell PeoplesMD Department of Hematology/Oncology CoLavalettet WeJefferson Surgical Ctr At Navy Yardhone: [phone]: [phone]: [email or web address]   08/27/2021 8:47 AM

## 2021-08-28 ENCOUNTER — Other Ambulatory Visit: Payer: Self-pay

## 2021-08-29 ENCOUNTER — Inpatient Hospital Stay: Payer: Medicaid Other

## 2021-08-29 ENCOUNTER — Other Ambulatory Visit: Payer: Self-pay

## 2021-08-29 VITALS — BP 112/67 | HR 87 | Temp 98.5°F | Resp 17

## 2021-08-29 DIAGNOSIS — Z5111 Encounter for antineoplastic chemotherapy: Secondary | ICD-10-CM | POA: Diagnosis not present

## 2021-08-29 DIAGNOSIS — Z95828 Presence of other vascular implants and grafts: Secondary | ICD-10-CM

## 2021-08-29 MED ORDER — SODIUM CHLORIDE 0.9% FLUSH
10.0000 mL | Freq: Once | INTRAVENOUS | Status: AC
  Administered 2021-08-29: 13:00:00 10 mL

## 2021-08-29 MED ORDER — HEPARIN SOD (PORK) LOCK FLUSH 100 UNIT/ML IV SOLN
500.0000 [IU] | Freq: Once | INTRAVENOUS | Status: AC
  Administered 2021-08-29: 13:00:00 500 [IU]

## 2021-09-02 ENCOUNTER — Other Ambulatory Visit (HOSPITAL_COMMUNITY): Payer: Self-pay

## 2021-09-02 MED ORDER — VARENICLINE TARTRATE 1 MG PO TABS
ORAL_TABLET | ORAL | 0 refills | Status: DC
  Filled 2021-09-02 – 2021-09-10 (×3): qty 180, 90d supply, fill #0

## 2021-09-03 ENCOUNTER — Other Ambulatory Visit: Payer: Self-pay

## 2021-09-03 ENCOUNTER — Ambulatory Visit: Payer: Medicaid Other | Admitting: Endocrinology

## 2021-09-03 VITALS — BP 138/64 | HR 104 | Ht 70.0 in | Wt 138.4 lb

## 2021-09-03 DIAGNOSIS — E118 Type 2 diabetes mellitus with unspecified complications: Secondary | ICD-10-CM

## 2021-09-03 LAB — POCT GLYCOSYLATED HEMOGLOBIN (HGB A1C): Hemoglobin A1C: 9.7 % — AB (ref 4.0–5.6)

## 2021-09-03 MED ORDER — HUMULIN N KWIKPEN 100 UNIT/ML ~~LOC~~ SUPN
35.0000 [IU] | PEN_INJECTOR | SUBCUTANEOUS | 3 refills | Status: DC
  Filled 2021-09-03: qty 30, 85d supply, fill #0
  Filled 2021-09-10: qty 15, 42d supply, fill #0
  Filled 2021-09-10: qty 30, 85d supply, fill #0
  Filled 2021-10-16: qty 15, 42d supply, fill #1
  Filled 2021-11-30: qty 30, 85d supply, fill #2

## 2021-09-03 NOTE — Patient Instructions (Addendum)
check your blood sugar twice a day.  vary the time of day when you check, between before the 3 meals, and at bedtime.  also check if you have symptoms of your blood sugar being too high or too low.  please keep a record of the readings and bring it to your next appointment here (or you can bring the meter itself).  You can write it on any piece of paper.  please call us sooner if your blood sugar goes below 70, or if most of your readings are over 200.    I have sent a prescription to your pharmacy, to increase the 35 units each morning.   Please come back for a follow-up appointment in 1 month.

## 2021-09-03 NOTE — Progress Notes (Signed)
Subjective:    Patient ID: [name] [name], male    DOB: [birth year], 61 y.o.   MRN: [id number]  HPI Pt returns for f/u of diabetes mellitus:  DM type: Insulin-requiring type 2 Dx'ed: 4599 Complications: left TMA and GP. Therapy: insulin since 2021 DKA: never Severe hypoglycemia: never Pancreatitis: never Pancreatic imaging (2022): large tumor in the head.   SDOH: none Other: stage 2B pancreatic cancer; He says chemo does not affect cbg; he eats just 2 meals/day. Interval history: medicaid has not yet approved NPH insulin, so he still takes the other 2 insulins.  He brings his meter with his cbg's which I have reviewed today.  CBG varies from 68-380.  It is in general higher as the day goes on, but not necessarily so.  Whipple procedure is planned soon.  Past Medical History:  Diagnosis Date   Barrett's esophagus dx 2016   Bronchitis    Chronic hepatitis C without hepatic coma ([place]) 10/31/2014   Depression    Diabetes ([place])    ED (erectile dysfunction)    GERD (gastroesophageal reflux disease)    Hepatitis C    Hiatal hernia 10/2014   3cm   Neuropathy    S/P transmetatarsal amputation of foot, left ([place]) 11/28/2020    Past Surgical History:  Procedure Laterality Date   AMPUTATION Left 11/15/2020   Procedure: LEFT TRANSMETATARSALS AMPUTATION;  Surgeon: Newt Minion, MD;  Location: [facility];  Service: Orthopedics;  Laterality: Left;   APPENDECTOMY     BILIARY STENT PLACEMENT N/A 02/27/2021   Procedure: BILIARY STENT PLACEMENT;  Surgeon: Milus Banister, MD;  Location: [facility];  Service: Endoscopy;  Laterality: N/A;   BILIARY STENT PLACEMENT N/A 03/27/2021   Procedure: BILIARY STENT PLACEMENT;  Surgeon: Jackquline Denmark, MD;  Location: [facility];  Service: Endoscopy;  Laterality: N/A;   BIOPSY  03/27/2021   Procedure: BIOPSY;  Surgeon: Jackquline Denmark, MD;  Location: [facility];  Service: Endoscopy;;   COLONOSCOPY N/A 02/16/2014   Procedure: COLONOSCOPY;  Surgeon: Gatha Mayer, MD;  Location: [facility];  Service: Endoscopy;  Laterality: N/A;   COLONOSCOPY     ENDOSCOPIC RETROGRADE CHOLANGIOPANCREATOGRAPHY (ERCP) WITH PROPOFOL N/A 02/27/2021   Procedure: ENDOSCOPIC RETROGRADE CHOLANGIOPANCREATOGRAPHY (ERCP) WITH PROPOFOL;  Surgeon: Milus Banister, MD;  Location: [facility];  Service: Endoscopy;  Laterality: N/A;   ERCP N/A 03/27/2021   Procedure: ENDOSCOPIC RETROGRADE CHOLANGIOPANCREATOGRAPHY (ERCP);  Surgeon: Jackquline Denmark, MD;  Location: [facility];  Service: Endoscopy;  Laterality: N/A;   ESOPHAGOGASTRODUODENOSCOPY (EGD) WITH PROPOFOL N/A 05/06/2021   Procedure: ESOPHAGOGASTRODUODENOSCOPY (EGD) WITH PROPOFOL;  Surgeon: Mauri Pole, MD;  Location: [facility];  Service: Endoscopy;  Laterality: N/A;   EUS N/A 02/27/2021   Procedure: UPPER ENDOSCOPIC ULTRASOUND (EUS) RADIAL;  Surgeon: Milus Banister, MD;  Location: [facility];  Service: Endoscopy;  Laterality: N/A;   FINE NEEDLE ASPIRATION N/A 02/27/2021   Procedure: FINE NEEDLE ASPIRATION (FNA) LINEAR;  Surgeon: Milus Banister, MD;  Location: [facility];  Service: Endoscopy;  Laterality: N/A;   IR IMAGING GUIDED PORT INSERTION  03/21/2021   SPHINCTEROTOMY  02/27/2021   Procedure: SPHINCTEROTOMY;  Surgeon: Milus Banister, MD;  Location: [facility];  Service: Endoscopy;;   STENT REMOVAL  03/27/2021   Procedure: STENT REMOVAL;  Surgeon: Jackquline Denmark, MD;  Location: [facility];  Service: Endoscopy;;   TONSILLECTOMY      Social History   Socioeconomic History   Marital status: Single    Spouse name: Not on  file   Number of children: Not on file   Years of education: Not on file   Highest education level: Not on file  Occupational History   Not on file  Tobacco Use   Smoking status: Every Day    Packs/day: 0.50    Years: 35.00    Pack years: 17.50    Types: Cigarettes   Smokeless tobacco: Never  Vaping Use   Vaping Use: Never used  Substance and Sexual Activity   Alcohol  use: Not Currently    Comment: previous   Drug use: No   Sexual activity: Not on file  Other Topics Concern   Not on file  Social History Narrative   Not on file   Social Determinants of Health   Financial Resource Strain: High Risk   Difficulty of Paying Living Expenses: Very hard  Food Insecurity: Food Insecurity Present   Worried About Charity fundraiser in the Last Year: Sometimes true   Arboriculturist in the Last Year: Sometimes true  Transportation Needs: Unmet Transportation Needs   Lack of Transportation (Medical): Yes   Lack of Transportation (Non-Medical): Yes  Physical Activity: Not on file  Stress: Not on file  Social Connections: Not on file  Intimate Partner Violence: Not on file    Current Outpatient Medications on File Prior to Visit  Medication Sig Dispense Refill   Accu-Chek Softclix Lancets lancets Check blood glucose level by fingerstick 3-4 times per day. 100 each 3   albuterol (VENTOLIN HFA) 108 (90 Base) MCG/ACT inhaler Inhale 2 puffs into the lungs every 6 (six) hours as needed for wheezing or shortness of breath. 18 g 0   atorvastatin (LIPITOR) 20 MG tablet Take 1 tablet (20 mg total) by mouth daily. 90 tablet 0   Blood Glucose Monitoring Suppl (ACCU-CHEK GUIDE) w/Device KIT Check blood glucose level by fingerstick 3-4 times per day. 1 kit 0   diphenoxylate-atropine (LOMOTIL) 2.5-0.025 MG tablet Take 1 tablet by mouth 4 (four) times daily as needed for diarrhea or loose stools. 60 tablet 1   DULoxetine (CYMBALTA) 60 MG capsule Take 1 capsule (60 mg total) by mouth daily. 90 capsule 1   EPINEPHrine 0.3 mg/0.3 mL IJ SOAJ injection Inject 0.3 mg into the muscle once as needed for anaphylaxis.     glucose blood (ACCU-CHEK GUIDE) test strip Check blood glucose level by fingerstick 3-4 times per day. 100 each 12   Insulin Pen Needle (TECHLITE PEN NEEDLES) 32G X 4 MM MISC USE AS INSTRUCTED INTO THE SKIN TWICE DAILY 100 each 11   Insulin Pen Needle (TRUEPLUS PEN  NEEDLES) 32G X 4 MM MISC Use as instructed. Inject into the skin twice daily 200 each 6   Insulin Syringe-Needle U-100 (TRUEPLUS INSULIN SYRINGE) 31G X 5/16" 0.3 ML MISC Use to inject insulin once daily. 100 each 6   lidocaine-prilocaine (EMLA) cream Apply 1 application topically as directed as needed. 30 g 0   lipase/protease/amylase (CREON) 36000 UNITS CPEP capsule Take 2 capsules (72,000 Units total) by mouth 3 (three) times daily with meals. May also take 1 capsule (36,000 Units total) as needed (with snacks). 210 capsule 3   losartan (COZAAR) 50 MG tablet Take 1 tablet (50 mg total) by mouth daily. 90 tablet 1   magic mouthwash (nystatin, lidocaine, diphenhydrAMINE, alum & mag hydroxide) suspension Swish 98m around mouth & swallow/spit every 6 hours as needed 280 mL 1   magic mouthwash (nystatin, lidocaine, diphenhydrAMINE, alum & mag hydroxide)  suspension Swish 66ms around mouth & swallow every 6 hours as needed 200 mL 1   mirtazapine (REMERON) 30 MG tablet Take 1 tablet (30 mg total) by mouth at bedtime. 90 tablet 1   ondansetron (ZOFRAN) 8 MG tablet Take 1 tablet (8 mg total) by mouth 2 (two) times daily as needed. Start on day 3 after chemotherapy. 30 tablet 1   oxyCODONE (OXY IR/ROXICODONE) 5 MG immediate release tablet Take 1 tablet (5 mg total) by mouth every 6 (six) hours as needed for severe pain. 60 tablet 0   pantoprazole (PROTONIX) 40 MG tablet Take 1 tablet (40 mg total) by mouth daily. 90 tablet 1   polyethylene glycol powder (GLYCOLAX/MIRALAX) 17 GM/SCOOP powder Take 17 g by mouth at bedtime. 238 g 0   prochlorperazine (COMPAZINE) 10 MG tablet Take 1 tablet (10 mg total) by mouth every 6 (six) hours as needed (Nausea or vomiting). 30 tablet 1   Syringe, Disposable, 3 ML MISC Use As Directed 100 each 4   varenicline (APO-VARENICLINE) 1 MG tablet Take 1 tablet by mouth once daily in the morning with food 84 tablet 1   varenicline (APO-VARENICLINE) 1 MG tablet Take 1 tablet by mouth 2  times daily with food 180 tablet 0   diphenoxylate-atropine (LOMOTIL) 2.5-0.025 MG tablet Take 1 tablet by mouth 4 times daily as needed for diarrhea or loose stools. 60 tablet 0   folic acid (FOLVITE) 1 MG tablet Take 1 tablet (1 mg total) by mouth in the morning.     metoCLOPramide (REGLAN) 5 MG tablet Take 1 tablet (5 mg total) by mouth 4 (four) times daily -  before meals and at bedtime. 90 tablet 1   Multiple Vitamins-Minerals (THEREMS-M) TABS Take 1 tablet by mouth in the morning. 90 tablet 3   nystatin-lidocaine-diphenhydrAMINE-alum & mag hydroxide-simeth-sterile water Take 5 mls by mouth four times a day as needed 400 mL 0   No current facility-administered medications on file prior to visit.    Allergies  Allergen Reactions   Bee Venom Anaphylaxis   Lactose Intolerance (Gi) Other (See Comments)    GI upset    Family History  Problem Relation Age of Onset   Breast cancer Mother    Stroke Father    Alcohol abuse Father    Heart disease Maternal Grandfather    Pancreatic cancer Paternal Grandmother    Diabetes Paternal Grandfather    Colon cancer Neg Hx    Stomach cancer Neg Hx    Rectal cancer Neg Hx    Esophageal cancer Neg Hx     BP 138/64    Pulse (!) 104    Ht _0  (1.778 m)    Wt 138 lb 6.4 oz (62.8 kg)    SpO2 97%    BMI 19.86 kg/m    Review of Systems     Objective:   Physical Exam    Lab Results  Component Value Date   HGBA1C 9.7 (A) 09/03/2021      Assessment & Plan:  Insulin-requiring type 2 DM: uncontrolled.    Patient Instructions  check your blood sugar twice a day.  vary the time of day when you check, between before the 3 meals, and at bedtime.  also check if you have symptoms of your blood sugar being too high or too low.  please keep a record of the readings and bring it to your next appointment here (or you can bring the meter itself).  You can write it  on any piece of paper.  please call us sooner if your blood sugar goes below 70, or if  most of your readings are over 200.    I have sent a prescription to your pharmacy, to increase the 35 units each morning.   Please come back for a follow-up appointment in 1 month.

## 2021-09-05 ENCOUNTER — Other Ambulatory Visit (HOSPITAL_COMMUNITY): Payer: Medicaid Other | Admitting: Dentistry

## 2021-09-07 ENCOUNTER — Other Ambulatory Visit (HOSPITAL_COMMUNITY): Payer: Self-pay

## 2021-09-07 ENCOUNTER — Other Ambulatory Visit: Payer: Self-pay | Admitting: Hematology and Oncology

## 2021-09-08 ENCOUNTER — Other Ambulatory Visit (HOSPITAL_COMMUNITY): Payer: Self-pay

## 2021-09-08 ENCOUNTER — Encounter: Payer: Self-pay | Admitting: Hematology and Oncology

## 2021-09-08 MED ORDER — OXYCODONE HCL 5 MG PO TABS
5.0000 mg | ORAL_TABLET | Freq: Four times a day (QID) | ORAL | 0 refills | Status: DC | PRN
  Filled 2021-09-08: qty 60, 15d supply, fill #0

## 2021-09-10 ENCOUNTER — Inpatient Hospital Stay: Payer: Medicaid Other

## 2021-09-10 ENCOUNTER — Inpatient Hospital Stay (HOSPITAL_BASED_OUTPATIENT_CLINIC_OR_DEPARTMENT_OTHER): Payer: Medicaid Other | Admitting: Physician Assistant

## 2021-09-10 ENCOUNTER — Encounter: Payer: Self-pay | Admitting: Hematology and Oncology

## 2021-09-10 ENCOUNTER — Other Ambulatory Visit: Payer: Self-pay | Admitting: Nurse Practitioner

## 2021-09-10 ENCOUNTER — Other Ambulatory Visit: Payer: Self-pay

## 2021-09-10 ENCOUNTER — Other Ambulatory Visit: Payer: Self-pay | Admitting: Hematology and Oncology

## 2021-09-10 ENCOUNTER — Telehealth: Payer: Self-pay

## 2021-09-10 ENCOUNTER — Inpatient Hospital Stay: Payer: Medicaid Other | Attending: Hematology and Oncology

## 2021-09-10 ENCOUNTER — Other Ambulatory Visit (HOSPITAL_COMMUNITY): Payer: Self-pay

## 2021-09-10 VITALS — BP 151/89 | HR 91 | Temp 96.9°F | Resp 18 | Wt 140.3 lb

## 2021-09-10 DIAGNOSIS — E1142 Type 2 diabetes mellitus with diabetic polyneuropathy: Secondary | ICD-10-CM

## 2021-09-10 DIAGNOSIS — C250 Malignant neoplasm of head of pancreas: Secondary | ICD-10-CM

## 2021-09-10 DIAGNOSIS — Z95828 Presence of other vascular implants and grafts: Secondary | ICD-10-CM

## 2021-09-10 DIAGNOSIS — Z79899 Other long term (current) drug therapy: Secondary | ICD-10-CM | POA: Insufficient documentation

## 2021-09-10 DIAGNOSIS — S91309A Unspecified open wound, unspecified foot, initial encounter: Secondary | ICD-10-CM | POA: Diagnosis not present

## 2021-09-10 DIAGNOSIS — D6959 Other secondary thrombocytopenia: Secondary | ICD-10-CM | POA: Diagnosis not present

## 2021-09-10 DIAGNOSIS — F17210 Nicotine dependence, cigarettes, uncomplicated: Secondary | ICD-10-CM | POA: Diagnosis not present

## 2021-09-10 DIAGNOSIS — Z5111 Encounter for antineoplastic chemotherapy: Secondary | ICD-10-CM | POA: Diagnosis present

## 2021-09-10 DIAGNOSIS — T451X5A Adverse effect of antineoplastic and immunosuppressive drugs, initial encounter: Secondary | ICD-10-CM | POA: Diagnosis not present

## 2021-09-10 DIAGNOSIS — E1165 Type 2 diabetes mellitus with hyperglycemia: Secondary | ICD-10-CM | POA: Insufficient documentation

## 2021-09-10 DIAGNOSIS — D6481 Anemia due to antineoplastic chemotherapy: Secondary | ICD-10-CM | POA: Diagnosis not present

## 2021-09-10 DIAGNOSIS — Z794 Long term (current) use of insulin: Secondary | ICD-10-CM | POA: Insufficient documentation

## 2021-09-10 DIAGNOSIS — K3184 Gastroparesis: Secondary | ICD-10-CM | POA: Diagnosis not present

## 2021-09-10 LAB — CBC WITH DIFFERENTIAL (CANCER CENTER ONLY)
Abs Immature Granulocytes: 0.02 10*3/uL (ref 0.00–0.07)
Basophils Absolute: 0.1 10*3/uL (ref 0.0–0.1)
Basophils Relative: 1 %
Eosinophils Absolute: 0.2 10*3/uL (ref 0.0–0.5)
Eosinophils Relative: 2 %
HCT: 36.5 % — ABNORMAL LOW (ref 39.0–52.0)
Hemoglobin: 12.3 g/dL — ABNORMAL LOW (ref 13.0–17.0)
Immature Granulocytes: 0 %
Lymphocytes Relative: 24 %
Lymphs Abs: 2 10*3/uL (ref 0.7–4.0)
MCH: 31.9 pg (ref 26.0–34.0)
MCHC: 33.7 g/dL (ref 30.0–36.0)
MCV: 94.6 fL (ref 80.0–100.0)
Monocytes Absolute: 1.8 10*3/uL — ABNORMAL HIGH (ref 0.1–1.0)
Monocytes Relative: 23 %
Neutro Abs: 4.1 10*3/uL (ref 1.7–7.7)
Neutrophils Relative %: 50 %
Platelet Count: 124 10*3/uL — ABNORMAL LOW (ref 150–400)
RBC: 3.86 MIL/uL — ABNORMAL LOW (ref 4.22–5.81)
RDW: 18 % — ABNORMAL HIGH (ref 11.5–15.5)
WBC Count: 8.1 10*3/uL (ref 4.0–10.5)
nRBC: 0 % (ref 0.0–0.2)

## 2021-09-10 LAB — CMP (CANCER CENTER ONLY)
ALT: 13 U/L (ref 0–44)
AST: 22 U/L (ref 15–41)
Albumin: 3.9 g/dL (ref 3.5–5.0)
Alkaline Phosphatase: 90 U/L (ref 38–126)
Anion gap: 7 (ref 5–15)
BUN: 10 mg/dL (ref 6–20)
CO2: 25 mmol/L (ref 22–32)
Calcium: 9.1 mg/dL (ref 8.9–10.3)
Chloride: 102 mmol/L (ref 98–111)
Creatinine: 0.55 mg/dL — ABNORMAL LOW (ref 0.61–1.24)
GFR, Estimated: >60 mL/min (ref >60)
Glucose, Bld: 291 mg/dL — ABNORMAL HIGH (ref 70–99)
Potassium: 3.5 mmol/L (ref 3.5–5.1)
Sodium: 134 mmol/L — ABNORMAL LOW (ref 135–145)
Total Bilirubin: 0.4 mg/dL (ref 0.3–1.2)
Total Protein: 7.7 g/dL (ref 6.5–8.1)

## 2021-09-10 MED ORDER — SODIUM CHLORIDE 0.9 % IV SOLN
150.0000 mg/m2 | Freq: Once | INTRAVENOUS | Status: AC
  Administered 2021-09-10: 280 mg via INTRAVENOUS
  Filled 2021-09-10: qty 14

## 2021-09-10 MED ORDER — SODIUM CHLORIDE 0.9 % IV SOLN
150.0000 mg | Freq: Once | INTRAVENOUS | Status: AC
  Administered 2021-09-10: 150 mg via INTRAVENOUS
  Filled 2021-09-10: qty 150

## 2021-09-10 MED ORDER — DEXTROSE 5 % IV SOLN: Freq: Once | INTRAVENOUS | Status: AC

## 2021-09-10 MED ORDER — ATROPINE SULFATE 1 MG/ML IV SOLN
0.5000 mg | Freq: Once | INTRAVENOUS | Status: AC | PRN
  Administered 2021-09-10: 0.5 mg via INTRAVENOUS
  Filled 2021-09-10: qty 1

## 2021-09-10 MED ORDER — ATORVASTATIN CALCIUM 20 MG PO TABS
20.0000 mg | ORAL_TABLET | Freq: Every day | ORAL | 0 refills | Status: DC
  Filled 2021-09-10: qty 90, 90d supply, fill #0

## 2021-09-10 MED ORDER — PALONOSETRON HCL INJECTION 0.25 MG/5ML
0.2500 mg | Freq: Once | INTRAVENOUS | Status: AC
  Administered 2021-09-10: 0.25 mg via INTRAVENOUS
  Filled 2021-09-10: qty 5

## 2021-09-10 MED ORDER — OXALIPLATIN CHEMO INJECTION 100 MG/20ML
85.0000 mg/m2 | Freq: Once | INTRAVENOUS | Status: AC
  Administered 2021-09-10: 160 mg via INTRAVENOUS
  Filled 2021-09-10: qty 32

## 2021-09-10 MED ORDER — DULOXETINE HCL 60 MG PO CPEP
60.0000 mg | ORAL_CAPSULE | Freq: Every day | ORAL | 0 refills | Status: DC
  Filled 2021-09-10 – 2021-11-30 (×3): qty 90, 90d supply, fill #0

## 2021-09-10 MED ORDER — SODIUM CHLORIDE 0.9% FLUSH
10.0000 mL | Freq: Once | INTRAVENOUS | Status: AC
  Administered 2021-09-10: 10 mL

## 2021-09-10 MED ORDER — SODIUM CHLORIDE 0.9 % IV SOLN
10.0000 mg | Freq: Once | INTRAVENOUS | Status: AC
  Administered 2021-09-10: 10 mg via INTRAVENOUS
  Filled 2021-09-10: qty 10

## 2021-09-10 MED ORDER — SODIUM CHLORIDE 0.9 % IV SOLN
2400.0000 mg/m2 | INTRAVENOUS | Status: DC
  Administered 2021-09-10: 4500 mg via INTRAVENOUS
  Filled 2021-09-10: qty 90

## 2021-09-10 MED ORDER — SODIUM CHLORIDE 0.9 % IV SOLN
400.0000 mg/m2 | Freq: Once | INTRAVENOUS | Status: AC
  Administered 2021-09-10: 748 mg via INTRAVENOUS
  Filled 2021-09-10: qty 25

## 2021-09-10 MED FILL — Nystatin Susp 100000 Unit/ML: OROMUCOSAL | 10 days supply | Qty: 200 | Fill #0 | Status: AC

## 2021-09-10 NOTE — Progress Notes (Signed)
[place] Telephone:([phone]) [phone]   [phone]  PROGRESS NOTE  Patient Care Team: [name], NP as PCP - General (Nurse Practitioner) Havery Moros, Carlota Raspberry, MD as Consulting Physician (Gastroenterology) Orson Slick, MD as Consulting Physician (Oncology) Royston Bake, RN as Registered Nurse  Hematological/Oncological History # Adenocarcinoma of the Pancreas, Stage IIB 02/26/2021: patient seen by GI for jaundice and pancreatic mass causing CBD obstruction. CT abdomen showed pancreatic head mass (4.4 x 3.7 cm) and enlarged portocaval lymph nodes. 02/27/2021: ERCP with placement of a nonmetallic biliary stent. FNA of pancreatic head showed malignant cells consistent with pancreatic adenocarcinoma. 03/05/2021: CT Chest showed no evidence of metastatic disease in the chest. 03/12/2021: establish care with Dr. Lorenso Courier 04/10/2021: Cycle 1 Day 1 of FOLFIRINOX 04/24/2021: Cycle 2 Day 1 of FOLFIRINOX 05/22/2021: Cycle 3 Day 1 of FOLFIRINOX. Delayed x 2 weeks due to hospitalization for poor po intake and dehydration due to gastroparesis. 06/05/2021: Cycle 4 Day 1 of FOLFIRINOX 06/19/2021: Cycle 5 Day 1 of FOLFIRINOX 07/02/2021: Cycle 6 Day 1 of FOLFIRINOX 07/16/2021: Cycle 7 Day 1 of FOLFIRINOX  07/30/2021: Cycle 8 Day 1 of FOLFIRINOX  08/13/2021: Cycle 9 Day 1 of FOLFIRINOX  08/27/2021:  Cycle 10 Day 1 of FOLFIRINOX  09/10/2021: Cycle 11 Day 1 of FOLFIRINOX   Interval History:  [name] [name] 61 y.o. male with medical history significant for borderline resectable pancreatic cancer presents for a follow up visit. The patient's last visit was on 08/27/2021. He presents to start cycle 11 of FOLFIRINOX.  On exam today Mr. Maulding reports that his energy levels are fairly stable.  He does have fatigue following chemotherapy but he tries to complete all his daily activities on his own.  His appetite is fair due to poor taste but he tries to eat to maintain his weight.   Since the last treatment, patient experienced daily nausea mainly in the morning.  He denies any vomiting episodes and symptoms improved after taking prescribed antiemetics.  He continues to have soft stools with intermittent episodes of diarrhea.  He takes Lomotil which improves his stool consistency and frequency.  He continues to have cold sensitivity with minimal residual neuropathy.  Over the last month, patient developed a small wound on the sole of the left foot. He reports some clear/light pink drainage of the wound. He denies any purulent discharge but notes some redness around the wound. He has pain when he walks on the left foot and is trying to wrap with gauze to given added cushion. He denies any fevers, chills, night sweats, shortness of breath or chest pain.  He has no other complaints. Full 10 point ROS is listed below.    MEDICAL HISTORY:  Past Medical History:  Diagnosis Date   Barrett's esophagus dx 2016   Bronchitis    Chronic hepatitis C without hepatic coma ([place]) 10/31/2014   Depression    Diabetes ([place])    ED (erectile dysfunction)    GERD (gastroesophageal reflux disease)    Hepatitis C    Hiatal hernia 10/2014   3cm   Neuropathy    S/P transmetatarsal amputation of foot, left ([place]) 11/28/2020    SURGICAL HISTORY: Past Surgical History:  Procedure Laterality Date   AMPUTATION Left 11/15/2020   Procedure: LEFT TRANSMETATARSALS AMPUTATION;  Surgeon: Newt Minion, MD;  Location: [facility];  Service: Orthopedics;  Laterality: Left;   APPENDECTOMY     BILIARY STENT PLACEMENT N/A 02/27/2021   Procedure: BILIARY STENT  PLACEMENT;  Surgeon: Milus Banister, MD;  Location: [facility];  Service: Endoscopy;  Laterality: N/A;   BILIARY STENT PLACEMENT N/A 03/27/2021   Procedure: BILIARY STENT PLACEMENT;  Surgeon: Jackquline Denmark, MD;  Location: [facility];  Service: Endoscopy;  Laterality: N/A;   BIOPSY  03/27/2021   Procedure: BIOPSY;  Surgeon: Jackquline Denmark, MD;  [facility]: [facility];  Service: Endoscopy;;   COLONOSCOPY N/A 02/16/2014   Procedure: COLONOSCOPY;  Surgeon: Gatha Mayer, MD;  Location: [facility];  Service: Endoscopy;  Laterality: N/A;   COLONOSCOPY     ENDOSCOPIC RETROGRADE CHOLANGIOPANCREATOGRAPHY (ERCP) WITH PROPOFOL N/A 02/27/2021   Procedure: ENDOSCOPIC RETROGRADE CHOLANGIOPANCREATOGRAPHY (ERCP) WITH PROPOFOL;  Surgeon: Milus Banister, MD;  [facility];  Service: Endoscopy;  Laterality: N/A;   ERCP N/A 03/27/2021   Procedure: ENDOSCOPIC RETROGRADE CHOLANGIOPANCREATOGRAPHY (ERCP);  Surgeon: Jackquline Denmark, MD;  Location: [facility];  Service: Endoscopy;  Laterality: N/A;   ESOPHAGOGASTRODUODENOSCOPY (EGD) WITH PROPOFOL N/A 05/06/2021   Procedure: ESOPHAGOGASTRODUODENOSCOPY (EGD) WITH PROPOFOL;  Surgeon: Mauri Pole, MD;  [facility];  Service: Endoscopy;  Laterality: N/A;   EUS N/A 02/27/2021   Procedure: UPPER ENDOSCOPIC ULTRASOUND (EUS) RADIAL;  Surgeon: Milus Banister, MD;  Location: [facility];  Service: Endoscopy;  Laterality: N/A;   FINE NEEDLE ASPIRATION N/A 02/27/2021   Procedure: FINE NEEDLE ASPIRATION (FNA) LINEAR;  Surgeon: Milus Banister, MD;  [facility]: [facility];  Service: Endoscopy;  Laterality: N/A;   IR IMAGING GUIDED PORT INSERTION  03/21/2021   SPHINCTEROTOMY  02/27/2021   Procedure: SPHINCTEROTOMY;  Surgeon: Milus Banister, MD;  Location: [facility];  Service: Endoscopy;;   STENT REMOVAL  03/27/2021   Procedure: STENT REMOVAL;  Surgeon: Jackquline Denmark, MD;  Location: [facility];  Service: Endoscopy;;   TONSILLECTOMY      SOCIAL HISTORY: Social History   Socioeconomic History   Marital status: Single    Spouse name: Not on file   Number of children: Not on file   Years of education: Not on file   Highest education level: Not on file  Occupational History   Not on file  Tobacco Use   Smoking status: Every Day    Packs/day: 0.50    Years: 35.00    Pack years: 17.50    Types:  Cigarettes   Smokeless tobacco: Never  Vaping Use   Vaping Use: Never used  Substance and Sexual Activity   Alcohol use: Not Currently    Comment: previous   Drug use: No   Sexual activity: Not on file  Other Topics Concern   Not on file  Social History Narrative   Not on file   Social Determinants of Health   Financial Resource Strain: High Risk   Difficulty of Paying Living Expenses: Very hard  Food Insecurity: Food Insecurity Present   Worried About Charity fundraiser in the Last Year: Sometimes true   Ran Out of Food in the Last Year: Sometimes true  Transportation Needs: Unmet Transportation Needs   Lack of Transportation (Medical): Yes   Lack of Transportation (Non-Medical): Yes  Physical Activity: Not on file  Stress: Not on file  Social Connections: Not on file  Intimate Partner Violence: Not on file    FAMILY HISTORY: Family History  Problem Relation Age of Onset   Breast cancer Mother    Stroke Father    Alcohol abuse Father    Heart disease Maternal Grandfather    Pancreatic cancer Paternal Grandmother  Diabetes Paternal Grandfather    Colon cancer Neg Hx    Stomach cancer Neg Hx    Rectal cancer Neg Hx    Esophageal cancer Neg Hx     ALLERGIES:  is allergic to bee venom and lactose intolerance (gi).  MEDICATIONS:  Current Outpatient Medications  Medication Sig Dispense Refill   Accu-Chek Softclix Lancets lancets Check blood glucose level by fingerstick 3-4 times per day. 100 each 3   albuterol (VENTOLIN HFA) 108 (90 Base) MCG/ACT inhaler Inhale 2 puffs into the lungs every 6 (six) hours as needed for wheezing or shortness of breath. 18 g 0   atorvastatin (LIPITOR) 20 MG tablet Take 1 tablet (20 mg total) by mouth daily. 90 tablet 0   Blood Glucose Monitoring Suppl (ACCU-CHEK GUIDE) w/Device KIT Check blood glucose level by fingerstick 3-4 times per day. 1 kit 0   diphenoxylate-atropine (LOMOTIL) 2.5-0.025 MG tablet Take 1 tablet by mouth 4 (four)  times daily as needed for diarrhea or loose stools. 60 tablet 1   diphenoxylate-atropine (LOMOTIL) 2.5-0.025 MG tablet Take 1 tablet by mouth 4 times daily as needed for diarrhea or loose stools. 60 tablet 0   DULoxetine (CYMBALTA) 60 MG capsule Take 1 capsule (60 mg total) by mouth daily. 90 capsule 0   EPINEPHrine 0.3 mg/0.3 mL IJ SOAJ injection Inject 0.3 mg into the muscle once as needed for anaphylaxis.     glucose blood (ACCU-CHEK GUIDE) test strip Check blood glucose level by fingerstick 3-4 times per day. 100 each 12   Insulin NPH, Human,, Isophane, (HUMULIN N KWIKPEN) 100 UNIT/ML Kiwkpen Inject 35 Units into the skin every morning. 45 mL 3   Insulin Pen Needle (TECHLITE PEN NEEDLES) 32G X 4 MM MISC USE AS INSTRUCTED INTO THE SKIN TWICE DAILY 100 each 11   Insulin Pen Needle (TRUEPLUS PEN NEEDLES) 32G X 4 MM MISC Use as instructed. Inject into the skin twice daily 200 each 6   Insulin Syringe-Needle U-100 (TRUEPLUS INSULIN SYRINGE) 31G X 5/16" 0.3 ML MISC Use to inject insulin once daily. 100 each 6   lidocaine-prilocaine (EMLA) cream Apply 1 application topically as directed as needed. 30 g 0   lipase/protease/amylase (CREON) 36000 UNITS CPEP capsule Take 2 capsules (72,000 Units total) by mouth 3 (three) times daily with meals. May also take 1 capsule (36,000 Units total) as needed (with snacks). 210 capsule 3   losartan (COZAAR) 50 MG tablet Take 1 tablet (50 mg total) by mouth daily. 90 tablet 1   magic mouthwash (nystatin, lidocaine, diphenhydrAMINE, alum & mag hydroxide) suspension Swish 97m around mouth & swallow/spit every 6 hours as needed 280 mL 1   magic mouthwash (nystatin, lidocaine, diphenhydrAMINE, alum & mag hydroxide) suspension Swish 56m around mouth & swallow every 6 hours as needed 200 mL 1   mirtazapine (REMERON) 30 MG tablet Take 1 tablet (30 mg total) by mouth at bedtime. 90 tablet 1   ondansetron (ZOFRAN) 8 MG tablet Take 1 tablet (8 mg total) by mouth 2 (two) times  daily as needed. Start on day 3 after chemotherapy. 30 tablet 1   oxyCODONE (OXY IR/ROXICODONE) 5 MG immediate release tablet Take 1 tablet by mouth every 6 hours as needed for severe pain. 60 tablet 0   pantoprazole (PROTONIX) 40 MG tablet Take 1 tablet (40 mg total) by mouth daily. 90 tablet 1   polyethylene glycol powder (GLYCOLAX/MIRALAX) 17 GM/SCOOP powder Take 17 g by mouth at bedtime. 238 g 0   prochlorperazine (  COMPAZINE) 10 MG tablet Take 1 tablet (10 mg total) by mouth every 6 (six) hours as needed (Nausea or vomiting). 30 tablet 1   Syringe, Disposable, 3 ML MISC Use As Directed 100 each 4   varenicline (APO-VARENICLINE) 1 MG tablet Take 1 tablet by mouth once daily in the morning with food 84 tablet 1   varenicline (APO-VARENICLINE) 1 MG tablet Take 1 tablet by mouth 2 times daily with food 283 tablet 0   folic acid (FOLVITE) 1 MG tablet Take 1 tablet (1 mg total) by mouth in the morning.     metoCLOPramide (REGLAN) 5 MG tablet Take 1 tablet (5 mg total) by mouth 4 (four) times daily -  before meals and at bedtime. 90 tablet 1   Multiple Vitamins-Minerals (THEREMS-M) TABS Take 1 tablet by mouth in the morning. 90 tablet 3   nystatin-lidocaine-diphenhydrAMINE-alum & mag hydroxide-simeth-sterile water Take 5 mls by mouth four times a day as needed 400 mL 0   No current facility-administered medications for this visit.    REVIEW OF SYSTEMS:   Constitutional: ( - ) fevers, ( - )  chills , ( - ) night sweats Eyes: ( - ) blurriness of vision, ( - ) double vision, ( - ) watery eyes Ears, nose, mouth, throat, and face: ( - ) mucositis, ( - ) sore throat Respiratory: ( - ) cough, ( - ) dyspnea, ( - ) wheezes Cardiovascular: ( - ) palpitation, ( - ) chest discomfort, ( - ) lower extremity swelling Gastrointestinal:  ( +) nausea, ( - ) heartburn, ( - ) change in bowel habits Skin: ( - ) abnormal skin rashes Lymphatics: ( - ) new lymphadenopathy, ( - ) easy bruising Neurological: ( - )  numbness, ( - ) tingling, ( - ) new weaknesses Behavioral/Psych: ( - ) mood change, ( - ) new changes  All other systems were reviewed with the patient and are negative.  PHYSICAL EXAMINATION: ECOG PERFORMANCE STATUS: 1 - Symptomatic but completely ambulatory  Vitals:   09/10/21 0954  BP: (!) 151/89  Pulse: 91  Resp: 18  Temp: (!) 96.9 F (36.1 C)  SpO2: 96%      Filed Weights   09/10/21 0954  Weight: 140 lb 4.8 oz (63.6 kg)   GENERAL: Well-appearing middle-age Caucasian male, alert, no distress and comfortable SKIN: skin color, texture, turgor are normal, no rashes or significant lesions.  EYES: conjunctiva are pink and non-injected, sclera clear LUNGS: clear to auscultation and percussion with normal breathing effort HEART: regular rate & rhythm and no murmurs and no lower extremity edema ABDOMEN: mild tenderness to palpation in the epigastric region. No guarding.  EXTREMITIES: Open wound noted on the base of left foot, approximately 1 cm in size in diameter, with surround erythema. No purulent discharge noted. PSYCH: alert & oriented x 3, fluent speech NEURO: no focal motor/sensory deficits  LABORATORY DATA:  I have reviewed the data as listed CBC Latest Ref Rng & Units 09/10/2021 08/27/2021 08/13/2021  WBC 4.0 - 10.5 K/uL 8.1 7.8 7.2  Hemoglobin 13.0 - 17.0 g/dL 12.3(L) 12.4(L) 11.7(L)  Hematocrit 39.0 - 52.0 % 36.5(L) 36.2(L) 35.5(L)  Platelets 150 - 400 K/uL 124(L) 121(L) 94(L)    CMP Latest Ref Rng & Units 08/27/2021 08/13/2021 07/30/2021  Glucose 70 - 99 mg/dL 166(H) 124(H) 254(H)  BUN 6 - 20 mg/dL _0 Creatinine 0.61 - 1.24 mg/dL 0.60(L) 0.63 0.49(L)  Sodium 135 - 145 mmol/L 139 140 135  Potassium 3.5 - 5.1 mmol/L 3.8 4.5 3.5  Chloride 98 - 111 mmol/L 105 106 99  CO2 22 - 32 mmol/L _0 Calcium 8.9 - 10.3 mg/dL 9.2 9.2 9.2  Total Protein 6.5 - 8.1 g/dL 7.8 7.4 8.1  Total Bilirubin 0.3 - 1.2 mg/dL 0.3 0.3 0.4  Alkaline Phos 38 - 126 U/L 85 80 95   AST 15 - 41 U/L 34 25 26  ALT 0 - 44 U/L _1 RADIOGRAPHIC STUDIES: I have personally reviewed the radiological images as listed and agreed with the findings in the report: Borderline resectable pancreatic mass with no evidence of metastatic disease. No results found.  ASSESSMENT & PLAN [name] [name] 61 y.o. male with medical history significant for borderline resectable pancreatic cancer presents for a follow up visit.   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  The patient does still have an elevation in bilirubin which likely represents a decline from the time of the latest stent placement.  He was evaluated by surgery who determined this is a borderline resectable tumor.  We will plan to start the patient on neoadjuvant chemotherapy.  The 2 options would be Gem/Abraxane or FOLFIRINOX.  Given his excellent functional status I do believe he would be a good candidate for FOLFIRINOX.   # Adenocarcinoma of the Pancreas, Stage IIB -- At this time disease appears borderline resectable.  We will proceed with neoadjuvant chemotherapy.  At this time would prefer a regimen of FOLFIRINOX given his good baseline health. -- case reviewed by surgery, who agrees his disease is borderline resectable.  -- baseline elevations in both CA 19-9 and CEA. Continue to monitor during treatment.  -- Cycle 1 Day 1 of FOLFIRINOX started 04/10/2021 Plan:  --Most recent CT scans from 07/15/2021 showed primary tumor is stable in size but still abuts the SMV.  --Dr. Zenia Resides has referred patient to surgical oncology at [facility] evaluated by Dr. Hyman Hopes who would like for patient to complete full 6 months of treatment prior to surgery.  --labs from today were reviewed, adequate to proceed with treatment today --proceed with Cycle 11, Day 1 of treatment today.  -- RTC in 2 weeks for Cycle  12 of treatment  #Gastroparesis #Nausea/Vomiting/Poor PO Intake --patient advanced diet to solid foods.  --nausea meds as below. --continue to work on glycemic control --continue to monitor.   #Chemotherapy induced Anemia and Thrombocytopenia --Hgb is stable at 12.3, continue to monitor --Plt decreased to 124K, no signs of bleeding. --ok to proceed with treatment --continue to monitor   # Pain Control -- Patient currently taking ibuprofen for his abdominal pain --continue oxycodone 5 to 10 mg every 6 hours as needed  #Hyperglycemia: --today's glucose level is 291.  --insulin-requiring type 2 DM --under the care of endocrinology  #left foot wound: --STAT referral to wound care --erythema present but no purulent discharge or foul smelling odor.  #Supportive Care -- chemotherapy education complete -- port placed -- zofran 7m q8H PRN and compazine 150mPO q6H for nausea --trial of Creon called into pharmacy to help with loose stools. --lomotil q 6 H PRN for diarrhea.  -- EMLA cream for port  No orders of the defined types were placed in this encounter.   All questions were answered. The patient knows to call the clinic with any problems, questions or concerns.  I have spent a total of 30 minutes minutes of face-to-face and non-face-to-face time, preparing to see the patient, performing a medically appropriate examination, counseling and educating the patient, documenting clinical information in the electronic health record, independently interpreting results and communicating results to the patient, and care coordination.   Dede Query PA-C Dept of Hematology and [place] at [facility] Phone: [phone]    09/10/2021 10:05 AM

## 2021-09-10 NOTE — Telephone Encounter (Signed)
[name] [name]: Please call and make referral for wound care for patient. Referral is in place  Referral faxed to [facility] [phone] and confirmation received

## 2021-09-10 NOTE — Patient Instructions (Addendum)
[facility] ONCOLOG[facility]   Discharge Instructions: Thank you for choosing [facility] to provide your oncology and hematology care.   If you have a lab appointment with the [facility], please go directly to the [facility] and check in at the registration area.   Wear comfortable clothing and clothing appropriate for easy access to any Portacath or PICC line.   We strive to give you quality time with your provider. You may need to reschedule your appointment if you arrive late (15 or more minutes).  Arriving late affects you and other patients whose appointments are after yours.  Also, if you miss three or more appointments without notifying the office, you may be dismissed from the clinic at the providers discretion.      For prescription refill requests, have your pharmacy contact our office and allow 72 hours for refills to be completed.    Today you received the following chemotherapy and/or immunotherapy agents: Oxaliplatin, Irinotecan, Leucovorin, and Fluorouracil (Adrucil)      To help prevent nausea and vomiting after your treatment, we encourage you to take your nausea medication as directed.  BELOW ARE SYMPTOMS THAT SHOULD BE REPORTED IMMEDIATELY: *FEVER GREATER THAN 100.4 F (38 C) OR HIGHER *CHILLS OR SWEATING *NAUSEA AND VOMITING THAT IS NOT CONTROLLED WITH YOUR NAUSEA MEDICATION *UNUSUAL SHORTNESS OF BREATH *UNUSUAL BRUISING OR BLEEDING *URINARY PROBLEMS (pain or burning when urinating, or frequent urination) *BOWEL PROBLEMS (unusual diarrhea, constipation, pain near the anus) TENDERNESS IN MOUTH AND THROAT WITH OR WITHOUT PRESENCE OF ULCERS (sore throat, sores in mouth, or a toothache) UNUSUAL RASH, SWELLING OR PAIN  UNUSUAL VAGINAL DISCHARGE OR ITCHING   Items with * indicate a potential emergency and should be followed up as soon as possible or go to the Emergency Department if any problems should occur.  Please show the  CHEMOTHERAPY ALERT CARD or IMMUNOTHERAPY ALERT CARD at check-in to the Emergency Department and triage nurse.  Should you have questions after your visit or need to cancel or reschedule your appointment, please contact [facility]  Dept: [phone]  and follow the prompts.  Office hours are 8:00 a.m. to 4:30 p.m. Monday - Friday. Please note that voicemails left after 4:00 p.m. may not be returned until the following business day.  We are closed weekends and major holidays. You have access to a nurse at all times for urgent questions. Please call the main number to the clinic Dept: [phone] and follow the prompts.   For any non-urgent questions, you may also contact your provider using MyChart. We now offer e-Visits for anyone 60 and older to request care online for non-urgent symptoms. For details visit [email or web address].   Also download the MyChart app! Go to the app store, search "MyChart", open the app, select [facility], and log in with your MyChart username and password.  Due to Covid, a mask is required upon entering the hospital/clinic. If you do not have a mask, one will be given to you upon arrival. For doctor visits, patients may have 1 support person aged 4 or older with them. For treatment visits, patients cannot have anyone with them due to current Covid guidelines and our immunocompromised population.   The chemotherapy medication bag should finish at 46 hours, 96 hours, or 7 days. For example, if your pump is scheduled for 46 hours and it was put on at 4:00 p.m., it should finish at 2:00 p.m. the day it is scheduled  to come off regardless of your appointment time.     Estimated time to finish at 2:00 PM Friday, 1/13   If the display on your pump reads "Low Volume" and it is beeping, take the batteries out of the pump and come to the cancer center for it to be taken off.   If the pump alarms go off prior to the pump reading "Low Volume" then  call [phone] and someone can assist you.  If the plunger comes out and the chemotherapy medication is leaking out, please use your home chemo spill kit to clean up the spill. Do NOT use paper towels or other household products.  If you have problems or questions regarding your pump, please call either [phone] (24 hours a day) or the cancer center Monday-Friday 8:00 a.m.- 4:30 p.m. at the clinic number and we will assist you. If you are unable to get assistance, then go to the nearest Emergency Department and ask the staff to contact the IV team for assistance.

## 2021-09-10 NOTE — Telephone Encounter (Signed)
Requested Prescriptions  Pending Prescriptions Disp Refills   atorvastatin (LIPITOR) 20 MG tablet 90 tablet 0    Sig: Take 1 tablet (20 mg total) by mouth daily.     Cardiovascular:  Antilipid - Statins Passed - 09/10/2021 12:21 AM      Passed - Total Cholesterol in normal range and within 360 days    Cholesterol, Total  Date Value Ref Range Status  04/28/2021 174 100 - 199 mg/dL Final         Passed - LDL in normal range and within 360 days    LDL Chol Calc (NIH)  Date Value Ref Range Status  04/28/2021 89 0 - 99 mg/dL Final         Passed - HDL in normal range and within 360 days    HDL  Date Value Ref Range Status  04/28/2021 70 >39 mg/dL Final         Passed - Triglycerides in normal range and within 360 days    Triglycerides  Date Value Ref Range Status  04/28/2021 78 0 - 149 mg/dL Final         Passed - Patient is not pregnant      Passed - Valid encounter within last 12 months    Recent Outpatient Visits          2 months ago GERD without esophagitis   [place], Vernia Buff, NP   3 months ago Dislocation of finger, subsequent encounter   [place], Vernia Buff, NP   5 months ago Hospital discharge follow-up   [place] [name], NP   8 months ago Loose stools   [place], Vernia Buff, NP   8 months ago Generalized abdominal pain   [facility] Kerin Perna, NP      Future Appointments            In 3 weeks [name], NP [place]            DULoxetine (CYMBALTA) 60 MG capsule 90 capsule 1    Sig: Take 1 capsule (60 mg total) by mouth daily.     Psychiatry: Antidepressants - SNRI Passed - 09/10/2021 12:21 AM      Passed - Completed PHQ-2 or PHQ-9 in the last 360 days      Passed - Last BP in normal range    BP Readings from Last 1  Encounters:  09/03/21 138/64         Passed - Valid encounter within last 6 months    Recent Outpatient Visits          2 months ago GERD without esophagitis   [place], Vernia Buff, NP   3 months ago Dislocation of finger, subsequent encounter   [place], Zelda W, NP   5 months ago Hospital discharge follow-up   [place] [name], NP   8 months ago Loose stools   [place], Vernia Buff, NP   8 months ago Generalized abdominal pain   [place] Kerin Perna, NP      Future Appointments            In 3 weeks [name], NP [facility]

## 2021-09-11 ENCOUNTER — Other Ambulatory Visit: Payer: Self-pay

## 2021-09-12 ENCOUNTER — Encounter (HOSPITAL_COMMUNITY): Payer: Self-pay | Admitting: Dentistry

## 2021-09-12 ENCOUNTER — Other Ambulatory Visit: Payer: Self-pay

## 2021-09-12 ENCOUNTER — Inpatient Hospital Stay: Payer: Medicaid Other

## 2021-09-12 ENCOUNTER — Other Ambulatory Visit (HOSPITAL_COMMUNITY): Payer: Self-pay

## 2021-09-12 ENCOUNTER — Ambulatory Visit (INDEPENDENT_AMBULATORY_CARE_PROVIDER_SITE_OTHER): Payer: Medicaid Other | Admitting: Dentistry

## 2021-09-12 VITALS — BP 123/78 | HR 91 | Temp 99.2°F

## 2021-09-12 VITALS — BP 106/69 | HR 80 | Temp 98.7°F | Resp 17

## 2021-09-12 DIAGNOSIS — K0889 Other specified disorders of teeth and supporting structures: Secondary | ICD-10-CM | POA: Insufficient documentation

## 2021-09-12 DIAGNOSIS — K0530 Chronic periodontitis, unspecified: Secondary | ICD-10-CM | POA: Insufficient documentation

## 2021-09-12 DIAGNOSIS — K029 Dental caries, unspecified: Secondary | ICD-10-CM | POA: Insufficient documentation

## 2021-09-12 DIAGNOSIS — K036 Deposits [accretions] on teeth: Secondary | ICD-10-CM | POA: Insufficient documentation

## 2021-09-12 DIAGNOSIS — K08109 Complete loss of teeth, unspecified cause, unspecified class: Secondary | ICD-10-CM | POA: Insufficient documentation

## 2021-09-12 DIAGNOSIS — Z0120 Encounter for dental examination and cleaning without abnormal findings: Secondary | ICD-10-CM | POA: Diagnosis not present

## 2021-09-12 DIAGNOSIS — Z5111 Encounter for antineoplastic chemotherapy: Secondary | ICD-10-CM | POA: Diagnosis not present

## 2021-09-12 DIAGNOSIS — C250 Malignant neoplasm of head of pancreas: Secondary | ICD-10-CM | POA: Diagnosis not present

## 2021-09-12 DIAGNOSIS — K0850 Unsatisfactory restoration of tooth, unspecified: Secondary | ICD-10-CM

## 2021-09-12 DIAGNOSIS — K1230 Oral mucositis (ulcerative), unspecified: Secondary | ICD-10-CM | POA: Insufficient documentation

## 2021-09-12 DIAGNOSIS — Z95828 Presence of other vascular implants and grafts: Secondary | ICD-10-CM

## 2021-09-12 DIAGNOSIS — Z972 Presence of dental prosthetic device (complete) (partial): Secondary | ICD-10-CM | POA: Insufficient documentation

## 2021-09-12 DIAGNOSIS — K083 Retained dental root: Secondary | ICD-10-CM | POA: Insufficient documentation

## 2021-09-12 MED ORDER — HEPARIN SOD (PORK) LOCK FLUSH 100 UNIT/ML IV SOLN
500.0000 [IU] | Freq: Once | INTRAVENOUS | Status: AC
  Administered 2021-09-12: 500 [IU]

## 2021-09-12 MED ORDER — CHLORHEXIDINE GLUCONATE 0.12 % MT SOLN
OROMUCOSAL | 2 refills | Status: DC
  Filled 2021-09-12: qty 473, 30d supply, fill #0
  Filled 2022-02-09: qty 473, 30d supply, fill #1
  Filled 2022-03-11: qty 473, 30d supply, fill #2

## 2021-09-12 MED ORDER — SODIUM CHLORIDE 0.9% FLUSH
10.0000 mL | Freq: Once | INTRAVENOUS | Status: AC
  Administered 2021-09-12: 10 mL

## 2021-09-12 NOTE — Progress Notes (Signed)
Department of Dental Medicine    Service Date:   09/12/2021  Patient Name:  [name] [name] Date of Birth:   [birth year] Medical Record Number: [id number]  Referring Provider:           Narda Rutherford, M.D.   TODAY'S VISIT: LIMITED EXAM   ASSESSMENT: There are no current signs of acute odontogenic infection including abscess, edema or erythema, or suspicious lesion requiring biopsy.   Accretions, bone loss, inflamed gingival tissue; caries, defective existing restorations; oral mucositis.  RECOMMENDATIONS: Brush twice a day with soft toothbrush. Use Waterpik once a day to clean in between teeth.  Irrigate between upper left molars & under gums with prescription mouthrinse as needed for irritation & gum discomfort.  PLAN: Rx:  Chlorhexidine gluconate 0.12% to use with given irrigation syringe as needed. Discuss returning for elective dental care/ comprehensive care once medically optimized.   Discuss case with medical team. Call if any questions or concerns arise. Discussed in detail all treatment options and recommendations with the patient and they are agreeable to the plan.    Thank you for consulting with Hospital Dentistry and for the opportunity to participate in this patient's treatment.  Should you have any questions or concerns, please contact the [facility] at [phone].       09/12/2021  PROGRESS NOTE:    COVID-19 SCREENING:  The patient denies symptoms concerning for COVID-19 infection including fever, chills, cough, or newly developed shortness of breath.   HISTORY OF PRESENT ILLNESS: [name] [name] is a very pleasant 61 y.o. male with h/o hypertension, hyperlipidemia, type 2 diabetes mellitus, GERD, Barrett's esophagus, Hepatitis C, depression, tobacco use (cigarettes- 35 yrs) and adenocarcinoma of the pancreas currently undergoing chemotherapy (also has chemotherapy induced anemia and thrombocytopenia) who presents today for a limited exam to evaluate  tooth/gum discomfort for possible odontogenic infection.   DENTAL HISTORY: The patient reports it has been a couple of years since he last saw a dentist regularly.  He says that he would like to find a new dentist to establish care with because he is having issues with some of his old crowns and fillings breaking/falling out.  He has an upper existing cast metal partial denture that is about 60 years old.  He currently denies any dental/orofacial pain or sensitivity. Patient is able to manage oral secretions.  Patient denies dysphagia, odynophagia, dysphonia, SOB and neck pain.  Patient denies fever, rigors and malaise.   CHIEF COMPLAINT:  "Mostly gum pain, especially when food gets stuck," points to the upper left quadrant teeth numbers 14 and 15.   The patient reports that on and off for several weeks he has discomfort/pain of his gums when he gets food such as popcorn stuck in between his teeth.  He says that he does have a Waterpik he uses to flush out the debris, but it is difficult.  He has Magic Mouthwash for mouth sores caused by chemotherapy.  He also reports having poor/limited taste.   Patient Active Problem List   Diagnosis Date Noted   Wound of foot 09/10/2021   Closed displaced fracture of proximal phalanx of finger of left hand 06/12/2021   AKI (acute kidney injury) ([place]) 05/12/2021   Abdominal distension    Gastroparesis    Gastric outlet obstruction 05/05/2021   Abdominal pain 05/05/2021   Dehydration 05/05/2021   Nausea & vomiting 05/04/2021   Port-A-Cath in place 04/24/2021   Malnutrition of moderate degree 03/27/2021   Hyperglycemia due to  diabetes mellitus ([place]) 03/26/2021   Pancreatic cancer ([place]) 03/12/2021   Malignant neoplasm of head of pancreas ([place]) 03/12/2021   Jaundice 02/26/2021   Loss of weight 02/26/2021   S/P transmetatarsal amputation of foot, left ([place]) 11/28/2020   Hypertension associated with type 2 diabetes mellitus ([place]) 11/28/2020   Type 2  diabetes mellitus with diabetic neuropathy, unspecified ([place]) 11/28/2020   Hyperlipidemia associated with type 2 diabetes mellitus ([place]) 11/28/2020   GERD without esophagitis 11/28/2020   Major depression, recurrent, chronic ([place]) 11/28/2020   Aortic atherosclerosis ([place]) 11/28/2020   Diabetic neuropathy ([place]) 11/28/2020   Osteomyelitis ([place]) 11/14/2020   Sepsis ([place]) 10/28/2020   Community acquired pneumonia of right lower lobe of lung    Elevated LFTs    Hypophosphatemia    Acute metabolic encephalopathy    Acute maxillary sinusitis    Depression    Physical deconditioning    Pneumonia 05/14/2020   DTs (delirium tremens) ([place]) 05/13/2020   Hyponatremia 05/13/2020   Hypokalemia 05/13/2020   SIRS (systemic inflammatory response syndrome) ([place]) 05/13/2020   Sepsis with acute hypoxic respiratory failure without septic shock ([place])    Alcohol abuse with alcohol-induced mood disorder ([place]) 12/17/2017   Hereditary hemochromatosis ([place]) 11/23/2017   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   Essential hypertension 04/02/2017   Hypogonadism male 08/13/2016   ETOH abuse 06/22/2016   Low serum testosterone level 01/08/2016   Type 2 diabetes mellitus with other specified complication ([place]) 49/82/6415   Erectile dysfunction 09/12/2015   Hepatic cirrhosis ([place]) 01/10/2015   Chronic hepatitis C without hepatic coma ([place]) 10/31/2014   Tobacco use disorder 08/03/2013   Diabetes type 2, controlled ([place]) 04/27/2013   Past Medical History:  Diagnosis Date   Barrett's esophagus dx 2016   Bronchitis    Chronic hepatitis C without hepatic coma ([place]) 10/31/2014   Depression    Diabetes ([place])    ED (erectile dysfunction)    GERD (gastroesophageal reflux disease)    Hepatitis C    Hiatal hernia 10/2014   3cm   Neuropathy    S/P transmetatarsal amputation of foot, left ([place]) 11/28/2020   Past Surgical History:  Procedure Laterality Date   AMPUTATION Left 11/15/2020   Procedure:  LEFT TRANSMETATARSALS AMPUTATION;  Surgeon: Newt Minion, MD;  Location: [place];  Service: Orthopedics;  Laterality: Left;   APPENDECTOMY     BILIARY STENT PLACEMENT N/A 02/27/2021   Procedure: BILIARY STENT PLACEMENT;  Surgeon: Milus Banister, MD;  Location: [facility];  Service: Endoscopy;  Laterality: N/A;   BILIARY STENT PLACEMENT N/A 03/27/2021   Procedure: BILIARY STENT PLACEMENT;  Surgeon: Jackquline Denmark, MD;  [facility]: [facility];  Service: Endoscopy;  Laterality: N/A;   BIOPSY  03/27/2021   Procedure: BIOPSY;  Surgeon: Jackquline Denmark, MD;  [facility];  Service: Endoscopy;;   COLONOSCOPY N/A 02/16/2014   Procedure: COLONOSCOPY;  Surgeon: Gatha Mayer, MD;  [facility];  Service: Endoscopy;  Laterality: N/A;   COLONOSCOPY     ENDOSCOPIC RETROGRADE CHOLANGIOPANCREATOGRAPHY (ERCP) WITH PROPOFOL N/A 02/27/2021   Procedure: ENDOSCOPIC RETROGRADE CHOLANGIOPANCREATOGRAPHY (ERCP) WITH PROPOFOL;  Surgeon: Milus Banister, MD;  [facility];  Service: Endoscopy;  Laterality: N/A;   ERCP N/A 03/27/2021   Procedure: ENDOSCOPIC RETROGRADE CHOLANGIOPANCREATOGRAPHY (ERCP);  Surgeon: Jackquline Denmark, MD;  [facility];  Service: Endoscopy;  Laterality: N/A;   ESOPHAGOGASTRODUODENOSCOPY (EGD) WITH PROPOFOL N/A 05/06/2021   Procedure: ESOPHAGOGASTRODUODENOSCOPY (EGD) WITH PROPOFOL;  Surgeon: Mauri Pole, MD;  [facility];  Service: Endoscopy;  Laterality: N/A;   EUS N/A 02/27/2021   Procedure: UPPER ENDOSCOPIC ULTRASOUND (EUS) RADIAL;  Surgeon: Milus Banister, MD;  [facility];  Service: Endoscopy;  Laterality: N/A;   FINE NEEDLE ASPIRATION N/A 02/27/2021   Procedure: FINE NEEDLE ASPIRATION (FNA) LINEAR;  Surgeon: Milus Banister, MD;  [facility];  Service: Endoscopy;  Laterality: N/A;   IR IMAGING GUIDED PORT INSERTION  03/21/2021   SPHINCTEROTOMY  02/27/2021   Procedure: SPHINCTEROTOMY;  Surgeon: Milus Banister, MD;   [facility];  Service: Endoscopy;;   STENT REMOVAL  03/27/2021   Procedure: STENT REMOVAL;  Surgeon: Jackquline Denmark, MD;  [facility];  Service: Endoscopy;;   TONSILLECTOMY     Allergies  Allergen Reactions   Bee Venom Anaphylaxis   Lactose Intolerance (Gi) Other (See Comments)    GI upset   Current Outpatient Medications  Medication Sig Dispense Refill   Accu-Chek Softclix Lancets lancets Check blood glucose level by fingerstick 3-4 times per day. 100 each 3   albuterol (VENTOLIN HFA) 108 (90 Base) MCG/ACT inhaler Inhale 2 puffs into the lungs every 6 (six) hours as needed for wheezing or shortness of breath. 18 g 0   atorvastatin (LIPITOR) 20 MG tablet Take 1 tablet (20 mg total) by mouth daily. 90 tablet 0   Blood Glucose Monitoring Suppl (ACCU-CHEK GUIDE) w/Device KIT Check blood glucose level by fingerstick 3-4 times per day. 1 kit 0   diphenoxylate-atropine (LOMOTIL) 2.5-0.025 MG tablet Take 1 tablet by mouth 4 (four) times daily as needed for diarrhea or loose stools. 60 tablet 1   DULoxetine (CYMBALTA) 60 MG capsule Take 1 capsule (60 mg total) by mouth daily. 90 capsule 0   EPINEPHrine 0.3 mg/0.3 mL IJ SOAJ injection Inject 0.3 mg into the muscle once as needed for anaphylaxis.     folic acid (FOLVITE) 1 MG tablet Take 1 tablet (1 mg total) by mouth in the morning.     glucose blood (ACCU-CHEK GUIDE) test strip Check blood glucose level by fingerstick 3-4 times per day. 100 each 12   Insulin NPH, Human,, Isophane, (HUMULIN N KWIKPEN) 100 UNIT/ML Kiwkpen Inject 35 Units into the skin every morning. 45 mL 3   Insulin Pen Needle (TECHLITE PEN NEEDLES) 32G X 4 MM MISC USE AS INSTRUCTED INTO THE SKIN TWICE DAILY 100 each 11   Insulin Pen Needle (TRUEPLUS PEN NEEDLES) 32G X 4 MM MISC Use as instructed. Inject into the skin twice daily 200 each 6   Insulin Syringe-Needle U-100 (TRUEPLUS INSULIN SYRINGE) 31G X 5/16" 0.3 ML MISC Use to inject insulin once daily. 100 each  6   lidocaine-prilocaine (EMLA) cream Apply 1 application topically as directed as needed. 30 g 0   lipase/protease/amylase (CREON) 36000 UNITS CPEP capsule Take 2 capsules (72,000 Units total) by mouth 3 (three) times daily with meals. May also take 1 capsule (36,000 Units total) as needed (with snacks). 210 capsule 3   losartan (COZAAR) 50 MG tablet Take 1 tablet (50 mg total) by mouth daily. 90 tablet 1   magic mouthwash (nystatin, lidocaine, diphenhydrAMINE, alum & mag hydroxide) suspension Swish 68ms around mouth & swallow every 6 hours as needed 200 mL 1   magic mouthwash (nystatin, lidocaine, diphenhydrAMINE, alum & mag hydroxide) suspension Swish 531m around mouth & swallow every 6 hours as needed 200 mL 1   metoCLOPramide (REGLAN) 5 MG tablet Take 1 tablet (5 mg total) by mouth 4 (four)  times daily -  before meals and at bedtime. 90 tablet 1   mirtazapine (REMERON) 30 MG tablet Take 1 tablet (30 mg total) by mouth at bedtime. 90 tablet 1   Multiple Vitamins-Minerals (THEREMS-M) TABS Take 1 tablet by mouth in the morning. 90 tablet 3   nystatin-lidocaine-diphenhydrAMINE-alum & mag hydroxide-simeth-sterile water Take 5 mls by mouth four times a day as needed 400 mL 0   ondansetron (ZOFRAN) 8 MG tablet Take 1 tablet (8 mg total) by mouth 2 (two) times daily as needed. Start on day 3 after chemotherapy. 30 tablet 1   oxyCODONE (OXY IR/ROXICODONE) 5 MG immediate release tablet Take 1 tablet by mouth every 6 hours as needed for severe pain. 60 tablet 0   pantoprazole (PROTONIX) 40 MG tablet Take 1 tablet (40 mg total) by mouth daily. 90 tablet 1   polyethylene glycol powder (GLYCOLAX/MIRALAX) 17 GM/SCOOP powder Take 17 g by mouth at bedtime. 238 g 0   prochlorperazine (COMPAZINE) 10 MG tablet Take 1 tablet (10 mg total) by mouth every 6 (six) hours as needed (Nausea or vomiting). 30 tablet 1   Syringe, Disposable, 3 ML MISC Use As Directed 100 each 4   varenicline (APO-VARENICLINE) 1 MG tablet  Take 1 tablet by mouth once daily in the morning with food 84 tablet 1   No current facility-administered medications for this visit.    LABS: Lab Results  Component Value Date   WBC 8.1 09/10/2021   HGB 12.3 (L) 09/10/2021   HCT 36.5 (L) 09/10/2021   MCV 94.6 09/10/2021   PLT 124 (L) 09/10/2021      Component Value Date/Time   NA 134 (L) 09/10/2021 0937   NA 129 (L) 09/23/2020 1037   K 3.5 09/10/2021 0937   CL 102 09/10/2021 0937   CO2 25 09/10/2021 0937   GLUCOSE 291 (H) 09/10/2021 0937   BUN 10 09/10/2021 0937   BUN 9 09/23/2020 1037   CREATININE 0.55 (L) 09/10/2021 0937   CREATININE 0.71 12/27/2020 0918   CALCIUM 9.1 09/10/2021 0937   GFRNONAA >60 09/10/2021 0937   GFRNONAA 103 12/27/2020 0918   GFRAA 120 12/27/2020 0918   Lab Results  Component Value Date   INR 1.2 03/26/2021   INR 1.0 03/12/2021   INR 1.3 (H) 02/26/2021   No results found for: PTT  Social History   Socioeconomic History   Marital status: Single    Spouse name: Not on file   Number of children: Not on file   Years of education: Not on file   Highest education level: Not on file  Occupational History   Not on file  Tobacco Use   Smoking status: Every Day    Packs/day: 0.50    Years: 35.00    Pack years: 17.50    Types: Cigarettes   Smokeless tobacco: Never  Vaping Use   Vaping Use: Never used  Substance and Sexual Activity   Alcohol use: Not Currently    Comment: previous   Drug use: No   Sexual activity: Not on file  Other Topics Concern   Not on file  Social History Narrative   Not on file   Social Determinants of Health   Financial Resource Strain: High Risk   Difficulty of Paying Living Expenses: Very hard  Food Insecurity: Food Insecurity Present   Worried About Running Out of Food in the Last Year: Sometimes true   Ran Out of Food in the Last Year: Sometimes true  Transportation Needs:  Unmet Transportation Needs   Lack of Transportation (Medical): Yes   Lack of  Transportation (Non-Medical): Yes  Physical Activity: Not on file  Stress: Not on file  Social Connections: Not on file  Intimate Partner Violence: Not on file   Family History  Problem Relation Age of Onset   Breast cancer Mother    Stroke Father    Alcohol abuse Father    Heart disease Maternal Grandfather    Pancreatic cancer Paternal Grandmother    Diabetes Paternal Grandfather    Colon cancer Neg Hx    Stomach cancer Neg Hx    Rectal cancer Neg Hx    Esophageal cancer Neg Hx      REVIEW OF SYSTEMS:  Reviewed with the patient as per HPI. Psych: Patient denies having dental phobia.   VITAL SIGNS: BP 123/78 (BP Location: Right Arm, Patient Position: Sitting, Cuff Size: Normal)    Pulse 91    Temp 99.2 F (37.3 C) (Oral)    PHYSICAL EXAM: General:  Well-developed, comfortable and in no apparent distress. Neurological:  Alert and oriented to person, place and  time. Extraoral:  Facial symmetry present without any edema or erythema.  No swelling or lymphadenopathy.  TMJ asymptomatic without clicks or crepitations.  Intraoral:  Soft tissues appear well-perfused and mucous membranes moist.  FOM and vestibules soft and not raised.  No signs of infection, parulis, sinus tract, edema or erythema evident upon exam. (+) Mucous membranes:  Regions of leukoplakia/ulcerations notable throughout oral cavity including floor of mouth, buccal mucosa and mandibular edentulous alveolar ridge; the patient reports the areas are sore spots that developed after starting chemotherapy.   DENTAL EXAM: Hard tissue (limited) exam completed and charted.    Overall impression:  Fair remaining dentition.    Oral hygiene:  Poor  Periodontal:  Pink, healthy gingival tissue with blunted papilla with areas of inflamed, erythematous tissue.  Generalized plaque and calculus accumulation. Caries:  #14, #15  Retained root tips:  #9 Defective restorations:  #14 existing gold crown with recurrent  decay Removable prosthodontics:  Existing maxillary cast metal partial denture; patient is able to wear it, but it does not fit as well and he is interested in fixing this in the future.  Occlusion:  Unable to assess molar occlusion.   Non-functional teeth:  #14, #15 Supra-erupted teeth:  #14, #15   RADIOGRAPHIC EXAM:  1 periapical exposed and interpreted (teeth #'s 14 & 15).      #14 & #15 mild-moderate horizontal bone loss.  Missing teeth #'s 12, 13 & 16.  #14 existing full-coverage crown; #15 has existing restorations.  Caries- #14D, #56M&D.   ASSESSMENT:  1.  Pancreatic cancer on active chemotherapy 2.  Limited dental exam 3.  Missing teeth 4.  Caries 5.  Defective restorations 6.  Retained root tips 7.  Chronic periodontitis 8.  Accretions on teeth 9.  Oral mucositis (medication-induced) 10.  Ill-fitting prosthesis 11.  Postoperative bleeding risk   PLAN AND RECOMMENDATIONS: I discussed the risks, benefits, and complications of various scenarios with the patient in relationship to their medical and dental conditions.  I explained all significant findings of the dental consultation with the patient including periodontal concerns/issues such as bone loss, calculus or tartar build-up and gingival inflammation and cavities, and the recommended care including comprehensive dental treatment to address all of his dental needs and in order to optimize his oral health.  I explained that his chief complaint regarding his gums around #14 and #15  is due to food/debris getting stuck and further irritating his teeth/gums because of bacteria and chronic inflammation.  I explained that maintaining good oral hygiene right now will help with his symptoms, and also I will prescribe him a mouthrinse to use with an irrigation syringe (given to the patient today) in between his teeth when he is having issues for right now. The patient verbalized understanding of all findings, discussion, and  recommendations. We then discussed various treatment options to include no treatment, multiple extractions with alveoloplasty, pre-prosthetic surgery as indicated, periodontal therapy, dental restorations, root canal therapy, crown and bridge therapy, implant therapy, and replacement of missing teeth as indicated.  I explained that because he doesn't have any acute infection of any teeth or severe decay, I would recommend waiting until he is done with chemotherapy and his immune system is stronger before having elective dental work done.   We also discussed the possibility of the patient returning to our clinic once he is medically optimized for routine dental work since we do accept his insurance and due to his medical history.  I explained that we would be able to complete a thorough exam and address all of his dental concerns/needs at that time.  The patient verbalized understanding of all options, and wishes to return to our clinic once he has finished chemotherapy/he is cleared for elective dental work. Plan to discuss all findings and recommendations with medical team and coordinate future care as needed.   Rx: Chlorhexidine gluconate 0.12% to use to irrigate/clean underneath his gums and in between his teeth when needed. Call if any questions or concerns arise.  All questions and concerns were invited and addressed.  The patient tolerated today's visit well and departed in stable condition.  Charlaine [name], D.M.D.

## 2021-09-16 ENCOUNTER — Other Ambulatory Visit: Payer: Self-pay

## 2021-09-16 ENCOUNTER — Encounter (HOSPITAL_BASED_OUTPATIENT_CLINIC_OR_DEPARTMENT_OTHER): Payer: Medicaid Other | Attending: Physician Assistant | Admitting: Internal Medicine

## 2021-09-16 DIAGNOSIS — E11621 Type 2 diabetes mellitus with foot ulcer: Secondary | ICD-10-CM | POA: Insufficient documentation

## 2021-09-16 DIAGNOSIS — E1140 Type 2 diabetes mellitus with diabetic neuropathy, unspecified: Secondary | ICD-10-CM | POA: Diagnosis not present

## 2021-09-16 DIAGNOSIS — L97529 Non-pressure chronic ulcer of other part of left foot with unspecified severity: Secondary | ICD-10-CM | POA: Diagnosis not present

## 2021-09-17 ENCOUNTER — Encounter (HOSPITAL_BASED_OUTPATIENT_CLINIC_OR_DEPARTMENT_OTHER): Payer: Medicaid Other | Admitting: Physician Assistant
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
## 2021-09-17 NOTE — Progress Notes (Signed)
[name] [name], [name] [name] ([id number]) Visit Report for 09/16/2021 Abuse/Suicide Risk Screen Details Patient Name: Date of Service: [name] [name], [name] [name] 09/16/2021 2:45 PM Medical Record Number: [id number] Patient Account Number: [id number] Date of Birth/Sex: Treating [name]: 1961-02-07 (61 y.o. [name] [name], [name] [name] Primary Care [name] [name]: [name] [name]: Referring [name] [name]: Treating [name] [name]/Extender: [name] [name], [name] [name] in Treatment: 0 Abuse/Suicide Risk Screen Items Answer ABUSE RISK SCREEN: Has anyone close to you tried to hurt or harm you recentlyo No Do you feel uncomfortable with anyone in your familyo No Has anyone forced you do things that you didnt want to doo No Electronic Signature(s) Signed: 09/17/2021 4:57:53 PM By: [name] [name] Entered By: [name] [name] on 09/16/2021 14:55:15 -------------------------------------------------------------------------------- Activities of Daily Living Details Patient Name: Date of ServiceCaralyn [name], [name] [name] 09/16/2021 2:45 PM Medical Record Number: [id number] Patient Account Number: [id number] Date of Birth/Sex: Treating [name]: 1961/02/26 (61 y.o. [name] [name], [name] [name] Primary Care [name] [name]: [name] [name]: Referring [name] [name]: Treating [name] [name]/Extender: [name] [name], [name] [name] in Treatment: 0 Activities of Daily Living Items Answer Activities of Daily Living (Please select one for each item) Drive Automobile Completely Able T Medications ake Completely Able Use T elephone Completely Able Care for Appearance Completely Able Use T oilet Completely Able Bath / Shower Completely Able Dress Self Completely Able Feed Self Completely Able Walk Completely Able Get In / Out Bed Completely Able Housework Completely Able Prepare Meals Completely [place] Completely Able Shop for Self Completely Able Electronic Signature(s) Signed: 09/17/2021 4:57:53 PM By: [name] [name] Entered By: [name] [name] on 09/16/2021 14:55:40 -------------------------------------------------------------------------------- Education Screening Details Patient Name: Date of Service: [name] [name], [name] [name] 09/16/2021 2:45 PM Medical Record Number: [id number] Patient Account Number: [id number] Date of Birth/Sex: Treating [name]: 1960/10/15 (61 y.o. [name] [name], [name] [name] Primary Care [name] [name]: [name] [name]: Referring [name] [name]: Treating [name] [name]/Extender: [name] [name] in Treatment: 0 Primary Learner Assessed: Patient Learning Preferences/Education Level/Primary Language Learning Preference: Explanation, Demonstration, Communication Board, Printed Material Highest Education Level: College or Above Preferred Language: English Cognitive Barrier Language Barrier: No Translator Needed: No Memory Deficit: No Emotional Barrier: No Cultural/Religious Beliefs Affecting Medical Care: No Physical Barrier Impaired Vision: Yes Glasses Impaired Hearing: No Decreased Hand dexterity: No Knowledge/Comprehension Knowledge Level: High Comprehension Level: High Ability to understand written instructions: High Ability to understand verbal instructions: High Motivation Anxiety Level: Calm Cooperation: Cooperative Education Importance: Denies Need Interest in Health Problems: Asks Questions Perception: Coherent Willingness to Engage in Self-Management High Activities: Readiness to Engage in Self-Management High Activities: Electronic Signature(s) Signed: 09/17/2021 4:57:53 PM By: [name] [name] Entered By: [name] [name] on 09/16/2021 14:56:10 -------------------------------------------------------------------------------- Fall Risk Assessment Details Patient Name: Date of Service: [name] [name], [name] [name] 09/16/2021 2:45 PM Medical Record Number: [id number] Patient Account Number: [id number] Date of Birth/Sex: Treating [name]: November 20, 1960  (61 y.o. [name] [name], [name] [name] Primary Care [name] [name]: [name] [name]: Referring [name] [name]: Treating [name] [name]/Extender: [name] [name], [name] [name] in Treatment: 0 Fall Risk Assessment Items Have you had 2 or more falls in the last 12 monthso 0 Yes Have you had any fall that resulted in injury in the last 12 monthso 0 Yes FALLS RISK SCREEN History of falling - immediate or within 3 months 25 Yes Secondary diagnosis (Do you have 2 or more medical diagnoseso) 0 No Ambulatory aid None/bed rest/wheelchair/nurse 0 No Crutches/cane/walker 0 No Furniture 0 No Intravenous therapy Access/Saline/Heparin Lock 0 No Gait/Transferring Normal/ bed rest/ wheelchair 0  No Weak (short steps with or without shuffle, stooped but able to lift head while walking, may seek 0 No support from furniture) Impaired (short steps with shuffle, may have difficulty arising from chair, head down, impaired 0 No balance) Mental Status Oriented to own ability 0 No Electronic Signature(s) Signed: 09/17/2021 4:57:53 PM By: [name] [name] Entered By: [name] [name] on 09/16/2021 14:56:22 -------------------------------------------------------------------------------- Foot Assessment Details Patient Name: Date of Service: [name] [name], [name] [name] 09/16/2021 2:45 PM Medical Record Number: [id number] Patient Account Number: [id number] Date of Birth/Sex: Treating [name]: 1961/04/21 (61 y.o. [name] [name], [name] [name] Primary Care [name] [name]: [name] [name]: Referring [name] [name]: Treating [name] [name]/Extender: [name] [name], [name] [name] in Treatment: 0 Foot Assessment Items Site Locations + = Sensation present, - = Sensation absent, C = Callus, U = Ulcer R = Redness, W = Warmth, M = Maceration, PU = Pre-ulcerative lesion F = Fissure, S = Swelling, D = Dryness Assessment Right: Left: Other Deformity: No Yes Prior Foot Ulcer: No Yes Prior Amputation: No No Charcot Joint: No  No Ambulatory Status: Ambulatory Without Help Gait: Steady Electronic Signature(s) Signed: 09/17/2021 4:57:53 PM By: [name] [name] Entered By: [name] [name] on 09/16/2021 15:02:43 -------------------------------------------------------------------------------- Nutrition Risk Screening Details Patient Name: Date of Service: [name] [name], [name] [name] 09/16/2021 2:45 PM Medical Record Number: [id number] Patient Account Number: [id number] Date of Birth/Sex: Treating [name]: 1960/11/08 (61 y.o. Erie Noe Primary Care Tishie Altmann: [name] [name]: Referring Daily Doe: Treating Earnest Mcgillis/Extender: [name] [name], [name] [name] in Treatment: 0 Height (in): Weight (lbs): Body Mass Index (BMI): Nutrition Risk Screening Items Score Screening NUTRITION RISK SCREEN: I have an illness or condition that made me change the kind and/or amount of food I eat 0 No I eat fewer than two meals per day 0 No I eat few fruits and vegetables, or milk products 0 No I have three or more drinks of beer, liquor or wine almost every day 0 No I have tooth or mouth problems that make it hard for me to eat 0 No I don't always have enough money to buy the food I need 0 No I eat alone most of the time 0 No I take three or more different prescribed or over-the-counter drugs a day 0 No Without wanting to, I have lost or gained 10 pounds in the last six months 0 No I am not always physically able to shop, cook and/or feed myself 0 No Nutrition Protocols Good Risk Protocol 0 No interventions needed Moderate Risk Protocol High Risk Proctocol Risk Level: Good Risk Score: 0 Electronic Signature(s) Signed: 09/17/2021 4:57:53 PM By: [name] [name] Entered By: [name] [name] on 09/16/2021 14:56:35
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
## 2021-09-23 ENCOUNTER — Encounter (HOSPITAL_BASED_OUTPATIENT_CLINIC_OR_DEPARTMENT_OTHER): Payer: Medicaid Other | Admitting: Internal Medicine

## 2021-09-23 ENCOUNTER — Other Ambulatory Visit: Payer: Self-pay

## 2021-09-23 DIAGNOSIS — E11621 Type 2 diabetes mellitus with foot ulcer: Secondary | ICD-10-CM | POA: Diagnosis not present

## 2021-09-23 NOTE — Progress Notes (Signed)
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 Secure with tape as directed. 1. We are still using silver collagen and moistening it properly either with saline or K-Y jelly 2. I am sending him for an x-ray and starting to think that an MRI is going to be necessary. 3. It would be possible to do a bone biopsy here along with a culture although I will see what the x-ray states first Electronic Signature(s) Signed: 09/23/2021 4:35:26 PM By: Linton Ham MD Entered By: Linton Ham on 09/23/2021 15:07:11 -------------------------------------------------------------------------------- SuperBill Details Patient Name: Date of Service: [name] [name], [name] [name]. 09/23/2021 Medical Record Number: [id number] Patient Account Number: [id number] Date of Birth/Sex: Treating RN: 02-Jan-1961 (61 y.o. 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.42 Type 2 diabetes mellitus with diabetic polyneuropathy Facility Procedures CPT4 Code: 16384665 Description: 99357 - DEB SUBQ TISSUE 20 SQ CM/< ICD-10 Diagnosis Description L97.528 Non-pressure chronic ulcer of other part of left foot with other specified seve Modifier: rity Quantity: 1 Physician Procedures : CPT4 Code Description Modifier [id number] - WC PHYS SUBQ TISS 20 SQ CM ICD-10 Diagnosis Description L97.528 Non-pressure chronic ulcer of other part of left foot with other specified severity Quantity: 1 Electronic Signature(s) Signed: 09/23/2021 4:35:26 PM By: Linton Ham MD Entered By: Linton Ham on 09/23/2021 15:07:22

## 2021-09-24 ENCOUNTER — Encounter: Payer: Self-pay | Admitting: Hematology and Oncology

## 2021-09-24 ENCOUNTER — Inpatient Hospital Stay: Payer: Medicaid Other

## 2021-09-24 ENCOUNTER — Telehealth: Payer: Self-pay | Admitting: Hematology and Oncology

## 2021-09-24 ENCOUNTER — Inpatient Hospital Stay (HOSPITAL_BASED_OUTPATIENT_CLINIC_OR_DEPARTMENT_OTHER): Payer: Medicaid Other | Admitting: Hematology and Oncology

## 2021-09-24 ENCOUNTER — Other Ambulatory Visit (HOSPITAL_COMMUNITY): Payer: Self-pay

## 2021-09-24 VITALS — BP 143/89 | HR 86 | Temp 97.1°F | Resp 18 | Wt 142.4 lb

## 2021-09-24 DIAGNOSIS — Z95828 Presence of other vascular implants and grafts: Secondary | ICD-10-CM

## 2021-09-24 DIAGNOSIS — Z5111 Encounter for antineoplastic chemotherapy: Secondary | ICD-10-CM | POA: Diagnosis not present

## 2021-09-24 DIAGNOSIS — C250 Malignant neoplasm of head of pancreas: Secondary | ICD-10-CM | POA: Diagnosis not present

## 2021-09-24 DIAGNOSIS — S91309A Unspecified open wound, unspecified foot, initial encounter: Secondary | ICD-10-CM

## 2021-09-24 LAB — CBC WITH DIFFERENTIAL (CANCER CENTER ONLY)
Abs Immature Granulocytes: 0.03 10*3/uL (ref 0.00–0.07)
Basophils Absolute: 0 10*3/uL (ref 0.0–0.1)
Basophils Relative: 1 %
Eosinophils Absolute: 0.2 10*3/uL (ref 0.0–0.5)
Eosinophils Relative: 2 %
HCT: 34.7 % — ABNORMAL LOW (ref 39.0–52.0)
Hemoglobin: 11.8 g/dL — ABNORMAL LOW (ref 13.0–17.0)
Immature Granulocytes: 0 %
Lymphocytes Relative: 28 %
Lymphs Abs: 2.4 10*3/uL (ref 0.7–4.0)
MCH: 32.4 pg (ref 26.0–34.0)
MCHC: 34 g/dL (ref 30.0–36.0)
MCV: 95.3 fL (ref 80.0–100.0)
Monocytes Absolute: 1.5 10*3/uL — ABNORMAL HIGH (ref 0.1–1.0)
Monocytes Relative: 18 %
Neutro Abs: 4.4 10*3/uL (ref 1.7–7.7)
Neutrophils Relative %: 51 %
Platelet Count: 110 10*3/uL — ABNORMAL LOW (ref 150–400)
RBC: 3.64 MIL/uL — ABNORMAL LOW (ref 4.22–5.81)
RDW: 17.2 % — ABNORMAL HIGH (ref 11.5–15.5)
WBC Count: 8.6 10*3/uL (ref 4.0–10.5)
nRBC: 0 % (ref 0.0–0.2)

## 2021-09-24 LAB — CMP (CANCER CENTER ONLY)
ALT: 11 U/L (ref 0–44)
AST: 21 U/L (ref 15–41)
Albumin: 4.1 g/dL (ref 3.5–5.0)
Alkaline Phosphatase: 90 U/L (ref 38–126)
Anion gap: 6 (ref 5–15)
BUN: 19 mg/dL (ref 6–20)
CO2: 27 mmol/L (ref 22–32)
Calcium: 9.5 mg/dL (ref 8.9–10.3)
Chloride: 104 mmol/L (ref 98–111)
Creatinine: 0.56 mg/dL — ABNORMAL LOW (ref 0.61–1.24)
GFR, Estimated: >60 mL/min (ref >60)
Glucose, Bld: 118 mg/dL — ABNORMAL HIGH (ref 70–99)
Potassium: 3.6 mmol/L (ref 3.5–5.1)
Sodium: 137 mmol/L (ref 135–145)
Total Bilirubin: 0.4 mg/dL (ref 0.3–1.2)
Total Protein: 7.9 g/dL (ref 6.5–8.1)

## 2021-09-24 MED ORDER — NYSTATIN 100000 UNIT/ML MT SUSP
OROMUCOSAL | 1 refills | Status: DC
  Filled 2021-09-24: qty 200, 10d supply, fill #0

## 2021-09-24 MED ORDER — DEXTROSE 5 % IV SOLN: Freq: Once | INTRAVENOUS | Status: AC

## 2021-09-24 MED ORDER — SODIUM CHLORIDE 0.9% FLUSH
10.0000 mL | Freq: Once | INTRAVENOUS | Status: AC
  Administered 2021-09-24: 08:00:00 10 mL

## 2021-09-24 MED ORDER — SODIUM CHLORIDE 0.9 % IV SOLN
10.0000 mg | Freq: Once | INTRAVENOUS | Status: AC
  Administered 2021-09-24: 10:00:00 10 mg via INTRAVENOUS
  Filled 2021-09-24: qty 10

## 2021-09-24 MED ORDER — OXALIPLATIN CHEMO INJECTION 100 MG/20ML
85.0000 mg/m2 | Freq: Once | INTRAVENOUS | Status: AC
  Administered 2021-09-24: 160 mg via INTRAVENOUS
  Filled 2021-09-24: qty 32

## 2021-09-24 MED ORDER — OXYCODONE HCL 5 MG PO TABS
10.0000 mg | ORAL_TABLET | Freq: Four times a day (QID) | ORAL | 0 refills | Status: DC | PRN
  Filled 2021-09-24: qty 120, 15d supply, fill #0

## 2021-09-24 MED ORDER — SODIUM CHLORIDE 0.9 % IV SOLN
400.0000 mg/m2 | Freq: Once | INTRAVENOUS | Status: AC
  Administered 2021-09-24: 13:00:00 748 mg via INTRAVENOUS
  Filled 2021-09-24: qty 37.4

## 2021-09-24 MED ORDER — SODIUM CHLORIDE 0.9 % IV SOLN
150.0000 mg/m2 | Freq: Once | INTRAVENOUS | Status: AC
  Administered 2021-09-24: 13:00:00 280 mg via INTRAVENOUS
  Filled 2021-09-24: qty 14

## 2021-09-24 MED ORDER — PALONOSETRON HCL INJECTION 0.25 MG/5ML
0.2500 mg | Freq: Once | INTRAVENOUS | Status: AC
  Administered 2021-09-24: 10:00:00 0.25 mg via INTRAVENOUS
  Filled 2021-09-24: qty 5

## 2021-09-24 MED ORDER — SODIUM CHLORIDE 0.9 % IV SOLN
2400.0000 mg/m2 | INTRAVENOUS | Status: DC
  Administered 2021-09-24: 14:00:00 4500 mg via INTRAVENOUS
  Filled 2021-09-24: qty 90

## 2021-09-24 MED ORDER — ATROPINE SULFATE 1 MG/ML IV SOLN
0.5000 mg | Freq: Once | INTRAVENOUS | Status: AC | PRN
  Administered 2021-09-24: 13:00:00 0.5 mg via INTRAVENOUS
  Filled 2021-09-24: qty 1

## 2021-09-24 MED ORDER — SODIUM CHLORIDE 0.9 % IV SOLN
150.0000 mg | Freq: Once | INTRAVENOUS | Status: AC
  Administered 2021-09-24: 10:00:00 150 mg via INTRAVENOUS
  Filled 2021-09-24: qty 150

## 2021-09-24 NOTE — Patient Instructions (Signed)
[facility]   Discharge Instructions: Thank you for choosing [facility] to provide your oncology and hematology care.   If you have a lab appointment with the [facility], please go directly to the [facility] and check in at the registration area.   Wear comfortable clothing and clothing appropriate for easy access to any Portacath or PICC line.   We strive to give you quality time with your provider. You may need to reschedule your appointment if you arrive late (15 or more minutes).  Arriving late affects you and other patients whose appointments are after yours.  Also, if you miss three or more appointments without notifying the office, you may be dismissed from the clinic at the providers discretion.      For prescription refill requests, have your pharmacy contact our office and allow 72 hours for refills to be completed.    Today you received the following chemotherapy and/or immunotherapy agents: oxaliplatin, irinotecan, leucovorin, fluorouracil      To help prevent nausea and vomiting after your treatment, we encourage you to take your nausea medication as directed.  BELOW ARE SYMPTOMS THAT SHOULD BE REPORTED IMMEDIATELY: *FEVER GREATER THAN 100.4 F (38 C) OR HIGHER *CHILLS OR SWEATING *NAUSEA AND VOMITING THAT IS NOT CONTROLLED WITH YOUR NAUSEA MEDICATION *UNUSUAL SHORTNESS OF BREATH *UNUSUAL BRUISING OR BLEEDING *URINARY PROBLEMS (pain or burning when urinating, or frequent urination) *BOWEL PROBLEMS (unusual diarrhea, constipation, pain near the anus) TENDERNESS IN MOUTH AND THROAT WITH OR WITHOUT PRESENCE OF ULCERS (sore throat, sores in mouth, or a toothache) UNUSUAL RASH, SWELLING OR PAIN  UNUSUAL VAGINAL DISCHARGE OR ITCHING   Items with * indicate a potential emergency and should be followed up as soon as possible or go to the Emergency Department if any problems should occur.  Please show the CHEMOTHERAPY ALERT CARD  or IMMUNOTHERAPY ALERT CARD at check-in to the Emergency Department and triage nurse.  Should you have questions after your visit or need to cancel or reschedule your appointment, please contact [facility]  Dept: [phone]  and follow the prompts.  Office hours are 8:00 a.m. to 4:30 p.m. Monday - Friday. Please note that voicemails left after 4:00 p.m. may not be returned until the following business day.  We are closed weekends and major holidays. You have access to a nurse at all times for urgent questions. Please call the main number to the clinic Dept: [phone] and follow the prompts.   For any non-urgent questions, you may also contact your provider using MyChart. We now offer e-Visits for anyone 12 and older to request care online for non-urgent symptoms. For details visit [email or web address].   Also download the MyChart app! Go to the app store, search "MyChart", open the app, select [facility], and log in with your MyChart username and password.  Due to Covid, a mask is required upon entering the hospital/clinic. If you do not have a mask, one will be given to you upon arrival. For doctor visits, patients may have 1 support person aged 61 or older with them. For treatment visits, patients cannot have anyone with them due to current Covid guidelines and our immunocompromised population.

## 2021-09-24 NOTE — Progress Notes (Signed)
[facility] [phone]   [phone]  PROGRESS NOTE  Patient Care Team: [name], NP as PCP - General (Nurse Practitioner) Havery Moros, Carlota Raspberry, MD as Consulting Physician (Gastroenterology) Orson Slick, MD as Consulting Physician (Oncology) Royston Bake, RN as Registered Nurse  Hematological/Oncological History # Adenocarcinoma of the Pancreas, Stage IIB 02/26/2021: patient seen by GI for jaundice and pancreatic mass causing CBD obstruction. CT abdomen showed pancreatic head mass (4.4 x 3.7 cm) and enlarged portocaval lymph nodes. 02/27/2021: ERCP with placement of a nonmetallic biliary stent. FNA of pancreatic head showed malignant cells consistent with pancreatic adenocarcinoma. 03/05/2021: CT Chest showed no evidence of metastatic disease in the chest. 03/12/2021: establish care with Dr. Lorenso Courier 04/10/2021: Cycle 1 Day 1 of FOLFIRINOX 04/24/2021: Cycle 2 Day 1 of FOLFIRINOX 05/22/2021: Cycle 3 Day 1 of FOLFIRINOX. Delayed x 2 weeks due to hospitalization for poor po intake and dehydration due to gastroparesis. 06/05/2021: Cycle 4 Day 1 of FOLFIRINOX 06/19/2021: Cycle 5 Day 1 of FOLFIRINOX 07/02/2021: Cycle 6 Day 1 of FOLFIRINOX 07/16/2021: Cycle 7 Day 1 of FOLFIRINOX  07/30/2021: Cycle 8 Day 1 of FOLFIRINOX  08/13/2021: Cycle 9 Day 1 of FOLFIRINOX  08/27/2021:  Cycle 10 Day 1 of FOLFIRINOX  09/10/2021: Cycle 11 Day 1 of FOLFIRINOX  09/24/2021: Cycle 12 Day 1 of FOLFIRINOX   Interval History:  [name] [name] 61 y.o. male with medical history significant for borderline resectable pancreatic cancer presents for a follow up visit. The patient's last visit was on 09/10/2021. He presents to start cycle 12 of FOLFIRINOX.  On exam today [name] reports he has been well overall in the interim since her last visit.  He reports that he is following with the wound clinic who is taking care of the wound on his foot.  He notes no foul smells or  purulent discharge.  He notes that it is "sore" but not terrible.  He reports that his last cycle of chemotherapy was quite "mild".  He notes he had some slight nausea and some occasional soft stools but no vomiting.  He did unfortunately lose his taste which caused him much disappoint when he was trying to eat some chili.  He reports he has upcoming appointment with the [facility] team on 10/10/2021.  His energy has been good and his appetite has been strong.  He is optimistic that the day is his last cycle of chemotherapy.  He denies any fevers, chills, night sweats, shortness of breath or chest pain.  He has no other complaints. Full 10 point ROS is listed below.    MEDICAL HISTORY:  Past Medical History:  Diagnosis Date   Barrett's esophagus dx 2016   Bronchitis    Chronic hepatitis C without hepatic coma ([facility]) 10/31/2014   Depression    Diabetes ([facility])    ED (erectile dysfunction)    GERD (gastroesophageal reflux disease)    Hepatitis C    Hiatal hernia 10/2014   3cm   Neuropathy    S/P transmetatarsal amputation of foot, left ([facility]) 11/28/2020    SURGICAL HISTORY: Past Surgical History:  Procedure Laterality Date   AMPUTATION Left 11/15/2020   Procedure: LEFT TRANSMETATARSALS AMPUTATION;  Surgeon: Newt Minion, MD;  Location: [facility];  Service: Orthopedics;  Laterality: Left;   APPENDECTOMY     BILIARY STENT PLACEMENT N/A 02/27/2021   Procedure: BILIARY STENT PLACEMENT;  Surgeon: Milus Banister, MD;  Location: [facility];  Service: Endoscopy;  Laterality:  N/A;   BILIARY STENT PLACEMENT N/A 03/27/2021   Procedure: BILIARY STENT PLACEMENT;  Surgeon: Jackquline Denmark, MD;  Location: [facility];  Service: Endoscopy;  Laterality: N/A;   BIOPSY  03/27/2021   Procedure: BIOPSY;  Surgeon: Jackquline Denmark, MD;  [facility];  Service: Endoscopy;;   COLONOSCOPY N/A 02/16/2014   Procedure: COLONOSCOPY;  Surgeon: Gatha Mayer, MD;  [facility];  Service: Endoscopy;  Laterality:  N/A;   COLONOSCOPY     ENDOSCOPIC RETROGRADE CHOLANGIOPANCREATOGRAPHY (ERCP) WITH PROPOFOL N/A 02/27/2021   Procedure: ENDOSCOPIC RETROGRADE CHOLANGIOPANCREATOGRAPHY (ERCP) WITH PROPOFOL;  Surgeon: Milus Banister, MD;  [facility];  Service: Endoscopy;  Laterality: N/A;   ERCP N/A 03/27/2021   Procedure: ENDOSCOPIC RETROGRADE CHOLANGIOPANCREATOGRAPHY (ERCP);  Surgeon: Jackquline Denmark, MD;  Location: [facility];  Service: Endoscopy;  Laterality: N/A;   ESOPHAGOGASTRODUODENOSCOPY (EGD) WITH PROPOFOL N/A 05/06/2021   Procedure: ESOPHAGOGASTRODUODENOSCOPY (EGD) WITH PROPOFOL;  Surgeon: Mauri Pole, MD;  [facility];  Service: Endoscopy;  Laterality: N/A;   EUS N/A 02/27/2021   Procedure: UPPER ENDOSCOPIC ULTRASOUND (EUS) RADIAL;  Surgeon: Milus Banister, MD;  [facility];  Service: Endoscopy;  Laterality: N/A;   FINE NEEDLE ASPIRATION N/A 02/27/2021   Procedure: FINE NEEDLE ASPIRATION (FNA) LINEAR;  Surgeon: Milus Banister, MD;  [facility];  Service: Endoscopy;  Laterality: N/A;   IR IMAGING GUIDED PORT INSERTION  03/21/2021   SPHINCTEROTOMY  02/27/2021   Procedure: SPHINCTEROTOMY;  Surgeon: Milus Banister, MD;  [facility];  Service: Endoscopy;;   STENT REMOVAL  03/27/2021   Procedure: Lavell Islam REMOVAL;  Surgeon: Jackquline Denmark, MD;  [facility];  Service: Endoscopy;;   TONSILLECTOMY      SOCIAL HISTORY: Social History   Socioeconomic History   Marital status: Single    Spouse name: Not on file   Number of children: Not on file   Years of education: Not on file   Highest education level: Not on file  Occupational History   Not on file  Tobacco Use   Smoking status: Every Day    Packs/day: 0.50    Years: 35.00    Pack years: 17.50    Types: Cigarettes   Smokeless tobacco: Never  Vaping Use   Vaping Use: Never used  Substance and Sexual Activity   Alcohol use: Not Currently    Comment: previous   Drug use: No    Sexual activity: Not on file  Other Topics Concern   Not on file  Social History Narrative   Not on file   Social Determinants of Health   Financial Resource Strain: High Risk   Difficulty of Paying Living Expenses: Very hard  Food Insecurity: Food Insecurity Present   Worried About Charity fundraiser in the Last Year: Sometimes true   Arboriculturist in the Last Year: Sometimes true  Transportation Needs: Unmet Transportation Needs   Lack of Transportation (Medical): Yes   Lack of Transportation (Non-Medical): Yes  Physical Activity: Not on file  Stress: Not on file  Social Connections: Not on file  Intimate Partner Violence: Not on file    FAMILY HISTORY: Family History  Problem Relation Age of Onset   Breast cancer Mother    Stroke Father    Alcohol abuse Father    Heart disease Maternal Grandfather    Pancreatic cancer Paternal Grandmother    Diabetes Paternal Grandfather    Colon cancer Neg Hx    Stomach cancer Neg  Hx    Rectal cancer Neg Hx    Esophageal cancer Neg Hx     ALLERGIES:  is allergic to bee venom and lactose intolerance (gi).  MEDICATIONS:  Current Outpatient Medications  Medication Sig Dispense Refill   Accu-Chek Softclix Lancets lancets Check blood glucose level by fingerstick 3-4 times per day. 100 each 3   albuterol (VENTOLIN HFA) 108 (90 Base) MCG/ACT inhaler Inhale 2 puffs into the lungs every 6 (six) hours as needed for wheezing or shortness of breath. 18 g 0   atorvastatin (LIPITOR) 20 MG tablet Take 1 tablet (20 mg total) by mouth daily. 90 tablet 0   Blood Glucose Monitoring Suppl (ACCU-CHEK GUIDE) w/Device KIT Check blood glucose level by fingerstick 3-4 times per day. 1 kit 0   chlorhexidine (PERIDEX) 0.12 % solution Rinse between teeth using irrigation syringe (15 mL) 2-3 times a day or as needed for gum irritation. 473 mL 2   diphenoxylate-atropine (LOMOTIL) 2.5-0.025 MG tablet Take 1 tablet by mouth 4 (four) times daily as needed for  diarrhea or loose stools. 60 tablet 1   DULoxetine (CYMBALTA) 60 MG capsule Take 1 capsule (60 mg total) by mouth daily. 90 capsule 0   EPINEPHrine 0.3 mg/0.3 mL IJ SOAJ injection Inject 0.3 mg into the muscle once as needed for anaphylaxis.     folic acid (FOLVITE) 1 MG tablet Take 1 tablet (1 mg total) by mouth in the morning.     glucose blood (ACCU-CHEK GUIDE) test strip Check blood glucose level by fingerstick 3-4 times per day. 100 each 12   Insulin NPH, Human,, Isophane, (HUMULIN N KWIKPEN) 100 UNIT/ML Kiwkpen Inject 35 Units into the skin every morning. 45 mL 3   Insulin Pen Needle (TECHLITE PEN NEEDLES) 32G X 4 MM MISC USE AS INSTRUCTED INTO THE SKIN TWICE DAILY 100 each 11   Insulin Pen Needle (TRUEPLUS PEN NEEDLES) 32G X 4 MM MISC Use as instructed. Inject into the skin twice daily 200 each 6   Insulin Syringe-Needle U-100 (TRUEPLUS INSULIN SYRINGE) 31G X 5/16" 0.3 ML MISC Use to inject insulin once daily. 100 each 6   lidocaine-prilocaine (EMLA) cream Apply 1 application topically as directed as needed. 30 g 0   lipase/protease/amylase (CREON) 36000 UNITS CPEP capsule Take 2 capsules (72,000 Units total) by mouth 3 (three) times daily with meals. May also take 1 capsule (36,000 Units total) as needed (with snacks). 210 capsule 3   losartan (COZAAR) 50 MG tablet Take 1 tablet (50 mg total) by mouth daily. 90 tablet 1   magic mouthwash (nystatin, lidocaine, diphenhydrAMINE, alum & mag hydroxide) suspension Swish 52ms around mouth & swallow every 6 hours as needed 200 mL 1   magic mouthwash (nystatin, lidocaine, diphenhydrAMINE, alum & mag hydroxide) suspension Swish 589m around mouth & swallow every 6 hours as needed 200 mL 1   metoCLOPramide (REGLAN) 5 MG tablet Take 1 tablet (5 mg total) by mouth 4 (four) times daily -  before meals and at bedtime. 90 tablet 1   mirtazapine (REMERON) 30 MG tablet Take 1 tablet (30 mg total) by mouth at bedtime. 90 tablet 1   Multiple Vitamins-Minerals  (THEREMS-M) TABS Take 1 tablet by mouth in the morning. 90 tablet 3   nystatin-lidocaine-diphenhydrAMINE-alum & mag hydroxide-simeth-sterile water Take 5 mls by mouth four times a day as needed 400 mL 0   ondansetron (ZOFRAN) 8 MG tablet Take 1 tablet (8 mg total) by mouth 2 (two) times daily as needed. Start  on day 3 after chemotherapy. 30 tablet 1   oxyCODONE (OXY IR/ROXICODONE) 5 MG immediate release tablet Take 2 tablets (10 mg total) by mouth every 6 (six) hours as needed for severe pain. 120 tablet 0   pantoprazole (PROTONIX) 40 MG tablet Take 1 tablet (40 mg total) by mouth daily. 90 tablet 1   polyethylene glycol powder (GLYCOLAX/MIRALAX) 17 GM/SCOOP powder Take 17 g by mouth at bedtime. 238 g 0   prochlorperazine (COMPAZINE) 10 MG tablet Take 1 tablet (10 mg total) by mouth every 6 (six) hours as needed (Nausea or vomiting). 30 tablet 1   Syringe, Disposable, 3 ML MISC Use As Directed 100 each 4   varenicline (APO-VARENICLINE) 1 MG tablet Take 1 tablet by mouth once daily in the morning with food 84 tablet 1   No current facility-administered medications for this visit.   Facility-Administered Medications Ordered in Other Visits  Medication Dose Route Frequency Provider Last Rate Last Admin   atropine injection 0.5 mg  0.5 mg Intravenous Once PRN Orson Slick, MD       dexamethasone (DECADRON) 10 mg in sodium chloride 0.9 % 50 mL IVPB  10 mg Intravenous Once Orson Slick, MD       fluorouracil (ADRUCIL) 4,500 mg in sodium chloride 0.9 % 60 mL chemo infusion  2,400 mg/m2 (Treatment Plan Recorded) Intravenous 1 day or 1 dose Orson Slick, MD       fosaprepitant (EMEND) 150 mg in sodium chloride 0.9 % 145 mL IVPB  150 mg Intravenous Once Orson Slick, MD       irinotecan (CAMPTOSAR) 280 mg in sodium chloride 0.9 % 500 mL chemo infusion  150 mg/m2 (Treatment Plan Recorded) Intravenous Once Orson Slick, MD       leucovorin 748 mg in sodium chloride 0.9 % 250 mL  infusion  400 mg/m2 (Treatment Plan Recorded) Intravenous Once Orson Slick, MD       oxaliplatin (ELOXATIN) 160 mg in dextrose 5 % 500 mL chemo infusion  85 mg/m2 (Treatment Plan Recorded) Intravenous Once Orson Slick, MD       palonosetron (ALOXI) injection 0.25 mg  0.25 mg Intravenous Once Orson Slick, MD        REVIEW OF SYSTEMS:   Constitutional: ( - ) fevers, ( - )  chills , ( - ) night sweats Eyes: ( - ) blurriness of vision, ( - ) double vision, ( - ) watery eyes Ears, nose, mouth, throat, and face: ( - ) mucositis, ( - ) sore throat Respiratory: ( - ) cough, ( - ) dyspnea, ( - ) wheezes Cardiovascular: ( - ) palpitation, ( - ) chest discomfort, ( - ) lower extremity swelling Gastrointestinal:  ( +) nausea, ( - ) heartburn, ( - ) change in bowel habits Skin: ( - ) abnormal skin rashes Lymphatics: ( - ) new lymphadenopathy, ( - ) easy bruising Neurological: ( - ) numbness, ( - ) tingling, ( - ) new weaknesses Behavioral/Psych: ( - ) mood change, ( - ) new changes  All other systems were reviewed with the patient and are negative.  PHYSICAL EXAMINATION: ECOG PERFORMANCE STATUS: 1 - Symptomatic but completely ambulatory  Vitals:   09/24/21 0843  BP: (!) 143/89  Pulse: 86  Resp: 18  Temp: (!) 97.1 F (36.2 C)  SpO2: 100%    Filed Weights   09/24/21 0843  Weight: 142 lb  6.4 oz (64.6 kg)   GENERAL: Well-appearing middle-age Caucasian male, alert, no distress and comfortable SKIN: skin color, texture, turgor are normal, no rashes or significant lesions.  EYES: conjunctiva are pink and non-injected, sclera clear LUNGS: clear to auscultation and percussion with normal breathing effort HEART: regular rate & rhythm and no murmurs and no lower extremity edema ABDOMEN: mild tenderness to palpation in the epigastric region. No guarding.  EXTREMITIES: wound currently dressed.  PSYCH: alert & oriented x 3, fluent speech NEURO: no focal motor/sensory  deficits  LABORATORY DATA:  I have reviewed the data as listed CBC Latest Ref Rng & Units 09/24/2021 09/10/2021 08/27/2021  WBC 4.0 - 10.5 K/uL 8.6 8.1 7.8  Hemoglobin 13.0 - 17.0 g/dL 11.8(L) 12.3(L) 12.4(L)  Hematocrit 39.0 - 52.0 % 34.7(L) 36.5(L) 36.2(L)  Platelets 150 - 400 K/uL 110(L) 124(L) 121(L)    CMP Latest Ref Rng & Units 09/24/2021 09/10/2021 08/27/2021  Glucose 70 - 99 mg/dL 118(H) 291(H) 166(H)  BUN 6 - 20 mg/dL _0 Creatinine 0.61 - 1.24 mg/dL 0.56(L) 0.55(L) 0.60(L)  Sodium 135 - 145 mmol/L 137 134(L) 139  Potassium 3.5 - 5.1 mmol/L 3.6 3.5 3.8  Chloride 98 - 111 mmol/L 104 102 105  CO2 22 - 32 mmol/L _1 Calcium 8.9 - 10.3 mg/dL 9.5 9.1 9.2  Total Protein 6.5 - 8.1 g/dL 7.9 7.7 7.8  Total Bilirubin 0.3 - 1.2 mg/dL 0.4 0.4 0.3  Alkaline Phos 38 - 126 U/L 90 90 85  AST 15 - 41 U/L 21 22 34  ALT 0 - 44 U/L _2 RADIOGRAPHIC STUDIES: I have personally reviewed the radiological images as listed and agreed with the findings in the report: Borderline resectable pancreatic mass with no evidence of metastatic disease. No results found.  ASSESSMENT & PLAN [name] [name] 61 y.o. male with medical history significant for borderline resectable pancreatic cancer presents for a follow up visit.   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  The patient does still have an elevation in bilirubin which likely represents a decline from the time of the latest stent placement.  He was evaluated by surgery who determined this is a borderline resectable tumor.  We will plan to start the patient on neoadjuvant chemotherapy.  The 2 options would be Gem/Abraxane or FOLFIRINOX.  Given his excellent functional status I do believe he would be a good candidate for FOLFIRINOX.   # Adenocarcinoma of the Pancreas, Stage IIB -- At this time disease  appears borderline resectable.  We will proceed with neoadjuvant chemotherapy.  At this time would prefer a regimen of FOLFIRINOX given his good baseline health. -- case reviewed by surgery, who agrees his disease is borderline resectable.  -- baseline elevations in both CA 19-9 and CEA. Continue to monitor during treatment.  -- Cycle 1 Day 1 of FOLFIRINOX started 04/10/2021 Plan:  --Most recent CT scans from 07/15/2021 showed primary tumor is stable in size but still abuts the SMV.  --Dr. Zenia Resides has referred patient to surgical oncology at [facility], evaluated by Dr. Hyman Hopes who would like for patient to complete full 6 months of treatment prior to surgery.  Discussed and reaffirmed this plan with him today.  Patient scheduled for visit to [facility] on 10/10/2021. --labs from today were reviewed, adequate to proceed with treatment today --proceed with Cycle  12, Day 1 of treatment today.  -- RTC in 4 weeks for re-evaluation.   #Gastroparesis #Nausea/Vomiting/Poor PO Intake --patient advanced diet to solid foods.  --nausea meds as below. --continue to work on glycemic control --continue to monitor.   #Chemotherapy induced Anemia and Thrombocytopenia --Hgb is stable at 11.8, continue to monitor --Plt decreased to 110 K, no signs of bleeding. --ok to proceed with treatment --continue to monitor   # Pain Control -- Patient currently taking ibuprofen for his abdominal pain --continue oxycodone 10 mg every 6 hours as needed  #Hyperglycemia: --today's glucose level is 118 --insulin-requiring type 2 DM --under the care of endocrinology  #left foot wound: --STAT referral to wound care --erythema present but no purulent discharge or foul smelling odor.  #Supportive Care -- chemotherapy education complete -- port placed -- zofran 14m q8H PRN and compazine 196mPO q6H for nausea --trial of Creon called into pharmacy to help with loose stools. --lomotil q 6 H PRN for diarrhea.  -- EMLA cream for  port  No orders of the defined types were placed in this encounter.   All questions were answered. The patient knows to call the clinic with any problems, questions or concerns.  I have spent a total of 30 minutes minutes of face-to-face and non-face-to-face time, preparing to see the patient, performing a medically appropriate examination, counseling and educating the patient, documenting clinical information in the electronic health record, independently interpreting results and communicating results to the patient, and care coordination.   JoLedell PeoplesMD Department of Hematology/Oncology CoKerbyt WeUniversity Behavioral Centerhone: [phone]: [phone]: [email or web address]  09/24/2021 9:37 AM

## 2021-09-24 NOTE — Telephone Encounter (Signed)
Called patient regarding upcoming February appointment, patient is notified. 

## 2021-09-26 ENCOUNTER — Other Ambulatory Visit: Payer: Self-pay

## 2021-09-26 ENCOUNTER — Ambulatory Visit (HOSPITAL_COMMUNITY)
Admission: RE | Admit: 2021-09-26 | Discharge: 2021-09-26 | Disposition: A | Discharge status: Home / Self Care | Payer: Medicaid Other | Source: Ambulatory Visit | Attending: Internal Medicine | Admitting: Internal Medicine

## 2021-09-26 ENCOUNTER — Inpatient Hospital Stay: Payer: Medicaid Other

## 2021-09-26 ENCOUNTER — Other Ambulatory Visit (HOSPITAL_COMMUNITY): Payer: Self-pay | Admitting: Internal Medicine

## 2021-09-26 VITALS — BP 108/68 | HR 86 | Temp 98.5°F | Resp 18

## 2021-09-26 DIAGNOSIS — E11621 Type 2 diabetes mellitus with foot ulcer: Secondary | ICD-10-CM

## 2021-09-26 DIAGNOSIS — L97509 Non-pressure chronic ulcer of other part of unspecified foot with unspecified severity: Secondary | ICD-10-CM

## 2021-09-26 DIAGNOSIS — Z95828 Presence of other vascular implants and grafts: Secondary | ICD-10-CM

## 2021-09-26 DIAGNOSIS — M869 Osteomyelitis, unspecified: Secondary | ICD-10-CM | POA: Insufficient documentation

## 2021-09-26 DIAGNOSIS — E1169 Type 2 diabetes mellitus with other specified complication: Secondary | ICD-10-CM | POA: Diagnosis present

## 2021-09-26 MED ORDER — SODIUM CHLORIDE 0.9% FLUSH
10.0000 mL | Freq: Once | INTRAVENOUS | Status: AC
  Administered 2021-09-26: 10 mL

## 2021-09-26 MED ORDER — HEPARIN SOD (PORK) LOCK FLUSH 100 UNIT/ML IV SOLN
500.0000 [IU] | Freq: Once | INTRAVENOUS | Status: AC
  Administered 2021-09-26: 500 [IU]

## 2021-09-30 ENCOUNTER — Other Ambulatory Visit: Payer: Self-pay

## 2021-09-30 ENCOUNTER — Other Ambulatory Visit: Payer: Self-pay | Admitting: Nurse Practitioner

## 2021-09-30 ENCOUNTER — Encounter (HOSPITAL_BASED_OUTPATIENT_CLINIC_OR_DEPARTMENT_OTHER): Payer: Medicaid Other | Admitting: Internal Medicine

## 2021-09-30 DIAGNOSIS — R159 Full incontinence of feces: Secondary | ICD-10-CM

## 2021-09-30 DIAGNOSIS — E11621 Type 2 diabetes mellitus with foot ulcer: Secondary | ICD-10-CM | POA: Diagnosis not present

## 2021-10-01 ENCOUNTER — Other Ambulatory Visit (HOSPITAL_COMMUNITY): Payer: Self-pay

## 2021-10-01 ENCOUNTER — Ambulatory Visit: Payer: Medicaid Other | Admitting: Nurse Practitioner

## 2021-10-01 NOTE — Telephone Encounter (Signed)
Requested medication (s) are due for refill today: yes  Requested medication (s) are on the active medication list: yes  Last refill:  06/06/21 #60 with 1 RF  Future visit scheduled: no  Notes to clinic:  This medication can not be delegated, please assess.   Requested Prescriptions  Pending Prescriptions Disp Refills   diphenoxylate-atropine (LOMOTIL) 2.5-0.025 MG tablet 60 tablet 1    Sig: Take 1 tablet by mouth 4 (four) times daily as needed for diarrhea or loose stools.     Not Delegated - Gastroenterology:  Antidiarrheals Failed - 09/30/2021  9:39 PM      Failed - This refill cannot be delegated      Passed - Valid encounter within last 12 months    Recent Outpatient Visits           2 months ago GERD without esophagitis   [place], Vernia Buff, NP   3 months ago Dislocation of finger, subsequent encounter   [place] [name], NP   5 months ago Hospital discharge follow-up   [place] [name], NP   8 months ago Loose stools   [place], Vernia Buff, NP   9 months ago Generalized abdominal pain   [place] Kerin Perna, NP       Future Appointments             Today [name], NP [place]
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
## 2021-10-02 ENCOUNTER — Other Ambulatory Visit (HOSPITAL_COMMUNITY): Payer: Self-pay

## 2021-10-02 ENCOUNTER — Other Ambulatory Visit: Payer: Self-pay | Admitting: Nurse Practitioner

## 2021-10-02 MED ORDER — ATORVASTATIN CALCIUM 20 MG PO TABS
20.0000 mg | ORAL_TABLET | Freq: Every day | ORAL | 0 refills | Status: DC
  Filled 2021-10-02 – 2021-11-30 (×2): qty 90, 90d supply, fill #0

## 2021-10-02 NOTE — Telephone Encounter (Signed)
Requested Prescriptions  Pending Prescriptions Disp Refills   atorvastatin (LIPITOR) 20 MG tablet 90 tablet 0    Sig: Take 1 tablet (20 mg total) by mouth daily.     Cardiovascular:  Antilipid - Statins Failed - 10/02/2021 10:14 AM      Failed - Lipid Panel in normal range within the last 12 months    Cholesterol, Total  Date Value Ref Range Status  04/28/2021 174 100 - 199 mg/dL Final   LDL Chol Calc (NIH)  Date Value Ref Range Status  04/28/2021 89 0 - 99 mg/dL Final   HDL  Date Value Ref Range Status  04/28/2021 70 >39 mg/dL Final   Triglycerides  Date Value Ref Range Status  04/28/2021 78 0 - 149 mg/dL Final         Passed - Patient is not pregnant      Passed - Valid encounter within last 12 months    Recent Outpatient Visits          2 months ago GERD without esophagitis   [place], Vernia Buff, NP   3 months ago Dislocation of finger, subsequent encounter   [place], Vernia Buff, NP   5 months ago Hospital discharge follow-up   [place] [name], NP   8 months ago Loose stools   [place], Vernia Buff, NP   9 months ago Generalized abdominal pain   [place], NP      Future Appointments            In 5 days [name], NP [place]

## 2021-10-06 ENCOUNTER — Other Ambulatory Visit (HOSPITAL_COMMUNITY): Payer: Self-pay

## 2021-10-07 ENCOUNTER — Ambulatory Visit (HOSPITAL_BASED_OUTPATIENT_CLINIC_OR_DEPARTMENT_OTHER): Payer: Medicaid Other | Admitting: Nurse Practitioner

## 2021-10-07 ENCOUNTER — Ambulatory Visit: Payer: Medicaid Other | Admitting: Endocrinology

## 2021-10-07 ENCOUNTER — Other Ambulatory Visit: Payer: Self-pay

## 2021-10-07 ENCOUNTER — Other Ambulatory Visit (HOSPITAL_COMMUNITY): Payer: Self-pay

## 2021-10-07 VITALS — BP 140/80 | HR 80 | Ht 70.0 in | Wt 146.2 lb

## 2021-10-07 DIAGNOSIS — R159 Full incontinence of feces: Secondary | ICD-10-CM

## 2021-10-07 DIAGNOSIS — E118 Type 2 diabetes mellitus with unspecified complications: Secondary | ICD-10-CM | POA: Diagnosis not present

## 2021-10-07 DIAGNOSIS — R195 Other fecal abnormalities: Secondary | ICD-10-CM

## 2021-10-07 NOTE — Progress Notes (Signed)
Virtual Visit Note Due to national recommendations of social distancing due to [place] 19, virtual visit is felt to be most appropriate for this patient at this time.  I discussed the limitations, risks, security and privacy concerns of performing an evaluation and management service by video and the availability of in person appointments. I also discussed with the patient that there may be a patient responsible charge related to this service. The patient expressed understanding and agreed to proceed.    I connected with [name] [name] on 10/09/21  at   2:10 PM EST  EDT by VIDEO and verified that I am speaking with the correct person using two identifiers.   Location of Patient: Private Residence   Location of Provider: [place] and [place] participating in VIRTUAL visit: [name] Rankins FNP-BC [name] [name]    History of Present Illness: VIRTUAL visit for: Loose Stools  He is requesting refill of lomotil for loose stools. He has a history of resectable pancreatic cancer and is currently being followed by Oncology and [facility] system for surgery. He denies melena, hematochezia or worsening abdominal pain  Doing well today aside from dealing with his loose fitting dentures. He will need to make sure this is taken care of as it may hinder his nutritional intake and affect nutritional status and surgical outcomes. He is working with his dentist on this.     Past Medical History:  Diagnosis Date   Barrett's esophagus dx 2016   Bronchitis    Chronic hepatitis C without hepatic coma ([place]) 10/31/2014   Depression    Diabetes ([place])    ED (erectile dysfunction)    GERD (gastroesophageal reflux disease)    Hepatitis C    Hiatal hernia 10/2014   3cm   Neuropathy    S/P transmetatarsal amputation of foot, left ([place]) 11/28/2020    Past Surgical History:  Procedure Laterality Date   AMPUTATION Left 11/15/2020   Procedure: LEFT  TRANSMETATARSALS AMPUTATION;  Surgeon: Newt Minion, MD;  Location: [facility];  Service: Orthopedics;  Laterality: Left;   APPENDECTOMY     BILIARY STENT PLACEMENT N/A 02/27/2021   Procedure: BILIARY STENT PLACEMENT;  Surgeon: Milus Banister, MD;  [facility];  Service: Endoscopy;  Laterality: N/A;   BILIARY STENT PLACEMENT N/A 03/27/2021   Procedure: BILIARY STENT PLACEMENT;  Surgeon: Jackquline Denmark, MD;  [facility];  Service: Endoscopy;  Laterality: N/A;   BIOPSY  03/27/2021   Procedure: BIOPSY;  Surgeon: Jackquline Denmark, MD;  [facility];  Service: Endoscopy;;   COLONOSCOPY N/A 02/16/2014   Procedure: COLONOSCOPY;  Surgeon: Gatha Mayer, MD;  [facility];  Service: Endoscopy;  Laterality: N/A;   COLONOSCOPY     ENDOSCOPIC RETROGRADE CHOLANGIOPANCREATOGRAPHY (ERCP) WITH PROPOFOL N/A 02/27/2021   Procedure: ENDOSCOPIC RETROGRADE CHOLANGIOPANCREATOGRAPHY (ERCP) WITH PROPOFOL;  Surgeon: Milus Banister, MD;  [facility];  Service: Endoscopy;  Laterality: N/A;   ERCP N/A 03/27/2021   Procedure: ENDOSCOPIC RETROGRADE CHOLANGIOPANCREATOGRAPHY (ERCP);  Surgeon: Jackquline Denmark, MD;  [facility];  Service: Endoscopy;  Laterality: N/A;   ESOPHAGOGASTRODUODENOSCOPY (EGD) WITH PROPOFOL N/A 05/06/2021   Procedure: ESOPHAGOGASTRODUODENOSCOPY (EGD) WITH PROPOFOL;  Surgeon: Mauri Pole, MD;  [facility];  Service: Endoscopy;  Laterality: N/A;   EUS N/A 02/27/2021   Procedure: UPPER ENDOSCOPIC ULTRASOUND (EUS) RADIAL;  Surgeon: Milus Banister, MD;  [facility];  Service: Endoscopy;  Laterality: N/A;   FINE NEEDLE ASPIRATION  N/A 02/27/2021   Procedure: FINE NEEDLE ASPIRATION (FNA) LINEAR;  Surgeon: Milus Banister, MD;  [facility];  Service: Endoscopy;  Laterality: N/A;   IR IMAGING GUIDED PORT INSERTION  03/21/2021   SPHINCTEROTOMY  02/27/2021   Procedure: SPHINCTEROTOMY;  Surgeon: Milus Banister, MD;   [facility];  Service: Endoscopy;;   STENT REMOVAL  03/27/2021   Procedure: Lavell Islam REMOVAL;  Surgeon: Jackquline Denmark, MD;  [facility];  Service: Endoscopy;;   TONSILLECTOMY      Family History  Problem Relation Age of Onset   Breast cancer Mother    Stroke Father    Alcohol abuse Father    Heart disease Maternal Grandfather    Pancreatic cancer Paternal Grandmother    Diabetes Paternal Grandfather    Colon cancer Neg Hx    Stomach cancer Neg Hx    Rectal cancer Neg Hx    Esophageal cancer Neg Hx     Social History   Socioeconomic History   Marital status: Single    Spouse name: Not on file   Number of children: Not on file   Years of education: Not on file   Highest education level: Not on file  Occupational History   Not on file  Tobacco Use   Smoking status: Every Day    Packs/day: 0.50    Years: 35.00    Pack years: 17.50    Types: Cigarettes   Smokeless tobacco: Never  Vaping Use   Vaping Use: Never used  Substance and Sexual Activity   Alcohol use: Not Currently    Comment: previous   Drug use: No   Sexual activity: Not on file  Other Topics Concern   Not on file  Social History Narrative   Not on file   Social Determinants of Health   Financial Resource Strain: High Risk   Difficulty of Paying Living Expenses: Very hard  Food Insecurity: Food Insecurity Present   Worried About Charity fundraiser in the Last Year: Sometimes true   Ran Out of Food in the Last Year: Sometimes true  Transportation Needs: Unmet Transportation Needs   Lack of Transportation (Medical): Yes   Lack of Transportation (Non-Medical): Yes  Physical Activity: Not on file  Stress: Not on file  Social Connections: Not on file     Observations/Objective: Awake, alert and oriented x 3   Review of Systems  Constitutional:  Negative for fever, malaise/fatigue and weight loss.  HENT: Negative.  Negative for nosebleeds.   Eyes: Negative.  Negative for blurred  vision, double vision and photophobia.  Respiratory: Negative.  Negative for cough and shortness of breath.   Cardiovascular: Negative.  Negative for chest pain, palpitations and leg swelling.  Gastrointestinal:  Positive for diarrhea. Negative for abdominal pain, blood in stool, constipation, heartburn, melena, nausea and vomiting.  Genitourinary: Negative.   Musculoskeletal: Negative.  Negative for myalgias.  Neurological: Negative.  Negative for dizziness, focal weakness, seizures and headaches.  Psychiatric/Behavioral: Negative.  Negative for suicidal ideas.    Assessment and Plan: Diagnoses and all orders for this visit:  Loose stools -     diphenoxylate-atropine (LOMOTIL) 2.5-0.025 MG tablet; Take 1 tablet by mouth 4 (four) times daily as needed for diarrhea or loose stools.     Follow Up Instructions No follow-ups on file.     I discussed the assessment and treatment plan with the patient. The patient was provided an opportunity to ask questions and all were answered. The patient  agreed with the plan and demonstrated an understanding of the instructions.   The patient was advised to call back or seek an in-person evaluation if the symptoms worsen or if the condition fails to improve as anticipated.  I provided 10 minutes of face-to-face time during this encounter including median intraservice time, reviewing previous notes, labs, imaging, medications and explaining diagnosis and management.  [name], FNP-BC

## 2021-10-07 NOTE — Progress Notes (Signed)
Subjective:    Patient ID: [name] [name], male    DOB: July 11, 1961, 61 y.o.   MRN: [id number]  HPI Pt returns for f/u of diabetes mellitus:  DM type: Insulin-requiring type 2 Dx'ed: 2025 Complications: left TMA and GP. Therapy: insulin since 2021 DKA: never Severe hypoglycemia: never.  Pancreatitis: never Pancreatic imaging (2022): large tumor in the head.   SDOH: medicaid declined CGM.   Other: stage 2B pancreatic cancer; He says chemo does not affect cbg; he eats just 2 meals/day. Interval history: He brings his meter with his cbg's which I have reviewed today.  CBG varies from 53-303. It is in general higher as the day goes on, but it is also lowest after a small lunch.  Whipple procedure is still planned soon.   Past Medical History:  Diagnosis Date   Barrett's esophagus dx 2016   Bronchitis    Chronic hepatitis C without hepatic coma ([place]) 10/31/2014   Depression    Diabetes ([place])    ED (erectile dysfunction)    GERD (gastroesophageal reflux disease)    Hepatitis C    Hiatal hernia 10/2014   3cm   Neuropathy    S/P transmetatarsal amputation of foot, left ([place]) 11/28/2020    Past Surgical History:  Procedure Laterality Date   AMPUTATION Left 11/15/2020   Procedure: LEFT TRANSMETATARSALS AMPUTATION;  Surgeon: Newt Minion, MD;  Location: [facility];  Service: Orthopedics;  Laterality: Left;   APPENDECTOMY     BILIARY STENT PLACEMENT N/A 02/27/2021   Procedure: BILIARY STENT PLACEMENT;  Surgeon: Milus Banister, MD;  [facility];  Service: Endoscopy;  Laterality: N/A;   BILIARY STENT PLACEMENT N/A 03/27/2021   Procedure: BILIARY STENT PLACEMENT;  Surgeon: Jackquline Denmark, MD;  [facility];  Service: Endoscopy;  Laterality: N/A;   BIOPSY  03/27/2021   Procedure: BIOPSY;  Surgeon: Jackquline Denmark, MD;  [facility];  Service: Endoscopy;;   COLONOSCOPY N/A 02/16/2014   Procedure: COLONOSCOPY;  Surgeon: Gatha Mayer, MD;  [facility];   Service: Endoscopy;  Laterality: N/A;   COLONOSCOPY     ENDOSCOPIC RETROGRADE CHOLANGIOPANCREATOGRAPHY (ERCP) WITH PROPOFOL N/A 02/27/2021   Procedure: ENDOSCOPIC RETROGRADE CHOLANGIOPANCREATOGRAPHY (ERCP) WITH PROPOFOL;  Surgeon: Milus Banister, MD;  [facility];  Service: Endoscopy;  Laterality: N/A;   ERCP N/A 03/27/2021   Procedure: ENDOSCOPIC RETROGRADE CHOLANGIOPANCREATOGRAPHY (ERCP);  Surgeon: Jackquline Denmark, MD;  [facility];  Service: Endoscopy;  Laterality: N/A;   ESOPHAGOGASTRODUODENOSCOPY (EGD) WITH PROPOFOL N/A 05/06/2021   Procedure: ESOPHAGOGASTRODUODENOSCOPY (EGD) WITH PROPOFOL;  Surgeon: Mauri Pole, MD;  [facility];  Service: Endoscopy;  Laterality: N/A;   EUS N/A 02/27/2021   Procedure: UPPER ENDOSCOPIC ULTRASOUND (EUS) RADIAL;  Surgeon: Milus Banister, MD;  [facility];  Service: Endoscopy;  Laterality: N/A;   FINE NEEDLE ASPIRATION N/A 02/27/2021   Procedure: FINE NEEDLE ASPIRATION (FNA) LINEAR;  Surgeon: Milus Banister, MD;  [facility];  Service: Endoscopy;  Laterality: N/A;   IR IMAGING GUIDED PORT INSERTION  03/21/2021   SPHINCTEROTOMY  02/27/2021   Procedure: SPHINCTEROTOMY;  Surgeon: Milus Banister, MD;  [facility];  Service: Endoscopy;;   STENT REMOVAL  03/27/2021   Procedure: STENT REMOVAL;  Surgeon: Jackquline Denmark, MD;  [facility];  Service: Endoscopy;;   TONSILLECTOMY      Social History   Socioeconomic History   Marital status: Single    Spouse name: Not on file   Number of  children: Not on file   Years of education: Not on file   Highest education level: Not on file  Occupational History   Not on file  Tobacco Use   Smoking status: Every Day    Packs/day: 0.50    Years: 35.00    Pack years: 17.50    Types: Cigarettes   Smokeless tobacco: Never  Vaping Use   Vaping Use: Never used  Substance and Sexual Activity   Alcohol use: Not Currently    Comment: previous    Drug use: No   Sexual activity: Not on file  Other Topics Concern   Not on file  Social History Narrative   Not on file   Social Determinants of Health   Financial Resource Strain: High Risk   Difficulty of Paying Living Expenses: Very hard  Food Insecurity: Food Insecurity Present   Worried About Charity fundraiser in the Last Year: Sometimes true   Arboriculturist in the Last Year: Sometimes true  Transportation Needs: Unmet Transportation Needs   Lack of Transportation (Medical): Yes   Lack of Transportation (Non-Medical): Yes  Physical Activity: Not on file  Stress: Not on file  Social Connections: Not on file  Intimate Partner Violence: Not on file    Current Outpatient Medications on File Prior to Visit  Medication Sig Dispense Refill   Accu-Chek Softclix Lancets lancets Check blood glucose level by fingerstick 3-4 times per day. 100 each 3   albuterol (VENTOLIN HFA) 108 (90 Base) MCG/ACT inhaler Inhale 2 puffs into the lungs every 6 (six) hours as needed for wheezing or shortness of breath. 18 g 0   atorvastatin (LIPITOR) 20 MG tablet Take 1 tablet (20 mg total) by mouth daily. 90 tablet 0   Blood Glucose Monitoring Suppl (ACCU-CHEK GUIDE) w/Device KIT Check blood glucose level by fingerstick 3-4 times per day. 1 kit 0   chlorhexidine (PERIDEX) 0.12 % solution Rinse between teeth using irrigation syringe (15 mL) 2-3 times a day or as needed for gum irritation. 473 mL 2   DULoxetine (CYMBALTA) 60 MG capsule Take 1 capsule (60 mg total) by mouth daily. 90 capsule 0   EPINEPHrine 0.3 mg/0.3 mL IJ SOAJ injection Inject 0.3 mg into the muscle once as needed for anaphylaxis.     folic acid (FOLVITE) 1 MG tablet Take 1 tablet (1 mg total) by mouth in the morning.     glucose blood (ACCU-CHEK GUIDE) test strip Check blood glucose level by fingerstick 3-4 times per day. 100 each 12   Insulin NPH, Human,, Isophane, (HUMULIN N KWIKPEN) 100 UNIT/ML Kiwkpen Inject 35 Units into the  skin every morning. 45 mL 3   Insulin Pen Needle (TECHLITE PEN NEEDLES) 32G X 4 MM MISC USE AS INSTRUCTED INTO THE SKIN TWICE DAILY 100 each 11   Insulin Pen Needle (TRUEPLUS PEN NEEDLES) 32G X 4 MM MISC Use as instructed. Inject into the skin twice daily 200 each 6   Insulin Syringe-Needle U-100 (TRUEPLUS INSULIN SYRINGE) 31G X 5/16" 0.3 ML MISC Use to inject insulin once daily. 100 each 6   lidocaine-prilocaine (EMLA) cream Apply 1 application topically as directed as needed. 30 g 0   lipase/protease/amylase (CREON) 36000 UNITS CPEP capsule Take 2 capsules (72,000 Units total) by mouth 3 (three) times daily with meals. May also take 1 capsule (36,000 Units total) as needed (with snacks). 210 capsule 3   losartan (COZAAR) 50 MG tablet Take 1 tablet (50 mg total) by mouth  daily. 90 tablet 1   magic mouthwash (nystatin, lidocaine, diphenhydrAMINE, alum & mag hydroxide) suspension Swish 44ms around mouth & swallow every 6 hours as needed 200 mL 1   magic mouthwash (nystatin, lidocaine, diphenhydrAMINE, alum & mag hydroxide) suspension Swish 575m around mouth & swallow every 6 hours as needed 200 mL 1   metoCLOPramide (REGLAN) 5 MG tablet Take 1 tablet (5 mg total) by mouth 4 (four) times daily -  before meals and at bedtime. 90 tablet 1   mirtazapine (REMERON) 30 MG tablet Take 1 tablet (30 mg total) by mouth at bedtime. 90 tablet 1   Multiple Vitamins-Minerals (THEREMS-M) TABS Take 1 tablet by mouth in the morning. 90 tablet 3   nystatin-lidocaine-diphenhydrAMINE-alum & mag hydroxide-simeth-sterile water Take 5 mls by mouth four times a day as needed 400 mL 0   ondansetron (ZOFRAN) 8 MG tablet Take 1 tablet (8 mg total) by mouth 2 (two) times daily as needed. Start on day 3 after chemotherapy. 30 tablet 1   oxyCODONE (OXY IR/ROXICODONE) 5 MG immediate release tablet Take 2 tablets (10 mg total) by mouth every 6 (six) hours as needed for severe pain. 120 tablet 0   pantoprazole (PROTONIX) 40 MG tablet  Take 1 tablet (40 mg total) by mouth daily. 90 tablet 1   polyethylene glycol powder (GLYCOLAX/MIRALAX) 17 GM/SCOOP powder Take 17 g by mouth at bedtime. 238 g 0   prochlorperazine (COMPAZINE) 10 MG tablet Take 1 tablet (10 mg total) by mouth every 6 (six) hours as needed for nausea or vomiting. 30 tablet 1   Syringe, Disposable, 3 ML MISC Use As Directed 100 each 4   varenicline (APO-VARENICLINE) 1 MG tablet Take 1 tablet by mouth once daily in the morning with food 84 tablet 1   No current facility-administered medications on file prior to visit.    Allergies  Allergen Reactions   Bee Venom Anaphylaxis   Lactose Intolerance (Gi) Other (See Comments)    GI upset    Family History  Problem Relation Age of Onset   Breast cancer Mother    Stroke Father    Alcohol abuse Father    Heart disease Maternal Grandfather    Pancreatic cancer Paternal Grandmother    Diabetes Paternal Grandfather    Colon cancer Neg Hx    Stomach cancer Neg Hx    Rectal cancer Neg Hx    Esophageal cancer Neg Hx     BP 140/80    Pulse 80    Ht '5\' 10"'  (1.778 m)    Wt 146 lb 3.2 oz (66.3 kg)    SpO2 98%    BMI 20.98 kg/m    Review of Systems     Objective:   Physical Exam    A1c=8.8%    Assessment & Plan:  DM: uncontrolled.  Hypoglycemia, due to insulin: this limits aggressiveness of glycemic control.   Patient Instructions  check your blood sugar twice a day.  vary the time of day when you check, between before the 3 meals, and at bedtime.  also check if you have symptoms of your blood sugar being too high or too low.  please keep a record of the readings and bring it to your next appointment here (or you can bring the meter itself).  You can write it on any piece of paper.  please [place] if your blood sugar goes below 70, or if most of your readings are over 200.    Please continue  the same insulin for now.   On this type of insulin schedule, you should eat meals on a regular schedule.   If a meal is missed, smaller than usual, or significantly delayed, your blood sugar could go low.   Please come back for a follow-up appointment in 2 months.

## 2021-10-07 NOTE — Patient Instructions (Addendum)
check your blood sugar twice a day.  vary the time of day when you check, between before the 3 meals, and at bedtime.  also check if you have symptoms of your blood sugar being too high or too low.  please keep a record of the readings and bring it to your next appointment here (or you can bring the meter itself).  You can write it on any piece of paper.  please call us sooner if your blood sugar goes below 70, or if most of your readings are over 200.    Please continue the same insulin for now.   On this type of insulin schedule, you should eat meals on a regular schedule.  If a meal is missed, smaller than usual, or significantly delayed, your blood sugar could go low.   Please come back for a follow-up appointment in 2 months.

## 2021-10-07 NOTE — Progress Notes (Signed)
ELIGH, RYBACKI ([id number]) Visit Report for 09/23/2021 Fall Risk Assessment Details Patient Name: Date of Service: [facility], [name]. 09/23/2021 2:00 PM Medical Record Number: [id number] Patient Account Number: [id number] Date of Birth/Sex: Treating RN: October 20, 1960 (61 y.o. [name] Primary Care Tyresse Jayson: [name] Other Clinician: Referring Gracee Ratterree: Treating Kamauri Kathol/Extender: Lenetta Quaker in Treatment: 1 Fall Risk Assessment Items Have you had 2 or more falls in the last 12 monthso 0 Yes Have you had any fall that resulted in injury in the last 12 monthso 0 No FALLS RISK SCREEN History of falling - immediate or within 3 months 25 Yes Secondary diagnosis (Do you have 2 or more medical diagnoseso) 15 Yes Ambulatory aid None/bed rest/wheelchair/nurse 0 No Crutches/cane/walker 0 No Furniture 0 No Intravenous therapy Access/Saline/Heparin Lock 0 No Gait/Transferring Normal/ bed rest/ wheelchair 0 No Weak (short steps with or without shuffle, stooped but able to lift head while walking, may seek 0 No support from furniture) Impaired (short steps with shuffle, may have difficulty arising from chair, head down, impaired 0 No balance) Mental Status Oriented to own ability 0 No Electronic Signature(s) Signed: 10/07/2021 8:14:39 AM By: Sharyn Creamer RN, BSN Entered By: Sharyn Creamer on 09/23/2021 14:16:02
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
## 2021-10-08 ENCOUNTER — Inpatient Hospital Stay: Payer: Medicaid Other | Admitting: Hematology and Oncology

## 2021-10-08 ENCOUNTER — Inpatient Hospital Stay: Payer: Medicaid Other

## 2021-10-08 ENCOUNTER — Other Ambulatory Visit: Payer: Self-pay

## 2021-10-08 MED ORDER — NICOTINE 14 MG/24HR TD PT24
MEDICATED_PATCH | TRANSDERMAL | 1 refills | Status: DC
  Filled 2021-10-08: qty 28, 28d supply, fill #0
  Filled 2021-11-07: qty 28, 28d supply, fill #1
  Filled 2022-02-09: qty 28, 28d supply, fill #2
  Filled 2022-03-11: qty 28, 28d supply, fill #3
  Filled 2022-03-23: qty 28, 28d supply, fill #4

## 2021-10-08 MED ORDER — DIPHENOXYLATE-ATROPINE 2.5-0.025 MG PO TABS
1.0000 | ORAL_TABLET | Freq: Four times a day (QID) | ORAL | 1 refills | Status: DC | PRN
  Filled 2021-10-08: qty 60, 15d supply, fill #0
  Filled 2021-11-30: qty 60, 15d supply, fill #1

## 2021-10-09 ENCOUNTER — Other Ambulatory Visit: Payer: Self-pay

## 2021-10-09 ENCOUNTER — Encounter: Payer: Self-pay | Admitting: Nurse Practitioner

## 2021-10-10 ENCOUNTER — Inpatient Hospital Stay: Payer: Medicaid Other

## 2021-10-13 ENCOUNTER — Encounter: Payer: Self-pay | Admitting: Hematology and Oncology

## 2021-10-13 ENCOUNTER — Other Ambulatory Visit (HOSPITAL_COMMUNITY): Payer: Self-pay

## 2021-10-13 ENCOUNTER — Other Ambulatory Visit: Payer: Self-pay | Admitting: Hematology and Oncology

## 2021-10-13 MED ORDER — OXYCODONE HCL 5 MG PO TABS
10.0000 mg | ORAL_TABLET | Freq: Four times a day (QID) | ORAL | 0 refills | Status: DC | PRN
  Filled 2021-10-13: qty 120, 15d supply, fill #0

## 2021-10-14 ENCOUNTER — Other Ambulatory Visit: Payer: Self-pay

## 2021-10-14 ENCOUNTER — Encounter (HOSPITAL_BASED_OUTPATIENT_CLINIC_OR_DEPARTMENT_OTHER): Payer: Medicaid Other | Attending: Internal Medicine | Admitting: Internal Medicine

## 2021-10-14 DIAGNOSIS — E11621 Type 2 diabetes mellitus with foot ulcer: Secondary | ICD-10-CM | POA: Insufficient documentation

## 2021-10-14 DIAGNOSIS — L97528 Non-pressure chronic ulcer of other part of left foot with other specified severity: Secondary | ICD-10-CM | POA: Insufficient documentation

## 2021-10-14 DIAGNOSIS — E1142 Type 2 diabetes mellitus with diabetic polyneuropathy: Secondary | ICD-10-CM | POA: Diagnosis not present
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
## 2021-10-16 ENCOUNTER — Encounter: Payer: Self-pay | Admitting: Hematology and Oncology

## 2021-10-16 ENCOUNTER — Other Ambulatory Visit (HOSPITAL_COMMUNITY): Payer: Self-pay

## 2021-10-16 ENCOUNTER — Other Ambulatory Visit: Payer: Self-pay | Admitting: Hematology and Oncology

## 2021-10-17 ENCOUNTER — Other Ambulatory Visit (HOSPITAL_COMMUNITY): Payer: Self-pay

## 2021-10-20 ENCOUNTER — Other Ambulatory Visit (HOSPITAL_COMMUNITY): Payer: Self-pay

## 2021-10-22 ENCOUNTER — Other Ambulatory Visit: Payer: Self-pay

## 2021-10-22 ENCOUNTER — Other Ambulatory Visit: Payer: Self-pay | Admitting: Hematology and Oncology

## 2021-10-22 ENCOUNTER — Inpatient Hospital Stay (HOSPITAL_BASED_OUTPATIENT_CLINIC_OR_DEPARTMENT_OTHER): Payer: Medicaid Other | Admitting: Hematology and Oncology

## 2021-10-22 ENCOUNTER — Ambulatory Visit: Payer: Medicaid Other

## 2021-10-22 ENCOUNTER — Inpatient Hospital Stay: Payer: Medicaid Other | Attending: Hematology and Oncology

## 2021-10-22 VITALS — BP 139/88 | HR 77 | Temp 97.4°F | Resp 18 | Wt 147.2 lb

## 2021-10-22 DIAGNOSIS — C250 Malignant neoplasm of head of pancreas: Secondary | ICD-10-CM

## 2021-10-22 DIAGNOSIS — Z452 Encounter for adjustment and management of vascular access device: Secondary | ICD-10-CM | POA: Diagnosis not present

## 2021-10-22 DIAGNOSIS — Z95828 Presence of other vascular implants and grafts: Secondary | ICD-10-CM

## 2021-10-22 DIAGNOSIS — S91309A Unspecified open wound, unspecified foot, initial encounter: Secondary | ICD-10-CM | POA: Diagnosis not present

## 2021-10-22 LAB — CBC WITH DIFFERENTIAL (CANCER CENTER ONLY)
Abs Immature Granulocytes: 0.02 10*3/uL (ref 0.00–0.07)
Basophils Absolute: 0.1 10*3/uL (ref 0.0–0.1)
Basophils Relative: 1 %
Eosinophils Absolute: 0.2 10*3/uL (ref 0.0–0.5)
Eosinophils Relative: 2 %
HCT: 36.2 % — ABNORMAL LOW (ref 39.0–52.0)
Hemoglobin: 12 g/dL — ABNORMAL LOW (ref 13.0–17.0)
Immature Granulocytes: 0 %
Lymphocytes Relative: 19 %
Lymphs Abs: 2 10*3/uL (ref 0.7–4.0)
MCH: 33.1 pg (ref 26.0–34.0)
MCHC: 33.1 g/dL (ref 30.0–36.0)
MCV: 100 fL (ref 80.0–100.0)
Monocytes Absolute: 1.5 10*3/uL — ABNORMAL HIGH (ref 0.1–1.0)
Monocytes Relative: 14 %
Neutro Abs: 6.6 10*3/uL (ref 1.7–7.7)
Neutrophils Relative %: 64 %
Platelet Count: 165 10*3/uL (ref 150–400)
RBC: 3.62 MIL/uL — ABNORMAL LOW (ref 4.22–5.81)
RDW: 14.6 % (ref 11.5–15.5)
WBC Count: 10.4 10*3/uL (ref 4.0–10.5)
nRBC: 0 % (ref 0.0–0.2)

## 2021-10-22 LAB — CMP (CANCER CENTER ONLY)
ALT: 13 U/L (ref 0–44)
AST: 26 U/L (ref 15–41)
Albumin: 4 g/dL (ref 3.5–5.0)
Alkaline Phosphatase: 115 U/L (ref 38–126)
Anion gap: 5 (ref 5–15)
BUN: 14 mg/dL (ref 6–20)
CO2: 27 mmol/L (ref 22–32)
Calcium: 9.2 mg/dL (ref 8.9–10.3)
Chloride: 101 mmol/L (ref 98–111)
Creatinine: 0.6 mg/dL — ABNORMAL LOW (ref 0.61–1.24)
GFR, Estimated: >60 mL/min (ref >60)
Glucose, Bld: 354 mg/dL — ABNORMAL HIGH (ref 70–99)
Potassium: 5.1 mmol/L (ref 3.5–5.1)
Sodium: 133 mmol/L — ABNORMAL LOW (ref 135–145)
Total Bilirubin: 0.4 mg/dL (ref 0.3–1.2)
Total Protein: 7.7 g/dL (ref 6.5–8.1)

## 2021-10-22 MED ORDER — SODIUM CHLORIDE 0.9% FLUSH
10.0000 mL | Freq: Once | INTRAVENOUS | Status: AC
  Administered 2021-10-22: 10 mL

## 2021-10-22 MED ORDER — HEPARIN SOD (PORK) LOCK FLUSH 100 UNIT/ML IV SOLN
500.0000 [IU] | Freq: Once | INTRAVENOUS | Status: AC
  Administered 2021-10-22: 500 [IU]

## 2021-10-22 NOTE — Progress Notes (Signed)
[place] [phone]   [phone]  PROGRESS NOTE  Patient Care Team: [name], NP as PCP - General (Nurse Practitioner) Havery Moros, Carlota Raspberry, MD as Consulting Physician (Gastroenterology) Orson Slick, MD as Consulting Physician (Oncology) Royston Bake, RN as Registered Nurse  Hematological/Oncological History # Adenocarcinoma of the Pancreas, Stage IIB 02/26/2021: patient seen by GI for jaundice and pancreatic mass causing CBD obstruction. CT abdomen showed pancreatic head mass (4.4 x 3.7 cm) and enlarged portocaval lymph nodes. 02/27/2021: ERCP with placement of a nonmetallic biliary stent. FNA of pancreatic head showed malignant cells consistent with pancreatic adenocarcinoma. 03/05/2021: CT Chest showed no evidence of metastatic disease in the chest. 03/12/2021: establish care with Dr. Lorenso Courier 04/10/2021: Cycle 1 Day 1 of FOLFIRINOX 04/24/2021: Cycle 2 Day 1 of FOLFIRINOX 05/22/2021: Cycle 3 Day 1 of FOLFIRINOX. Delayed x 2 weeks due to hospitalization for poor po intake and dehydration due to gastroparesis. 06/05/2021: Cycle 4 Day 1 of FOLFIRINOX 06/19/2021: Cycle 5 Day 1 of FOLFIRINOX 07/02/2021: Cycle 6 Day 1 of FOLFIRINOX 07/16/2021: Cycle 7 Day 1 of FOLFIRINOX  07/30/2021: Cycle 8 Day 1 of FOLFIRINOX  08/13/2021: Cycle 9 Day 1 of FOLFIRINOX  08/27/2021:  Cycle 10 Day 1 of FOLFIRINOX  09/10/2021: Cycle 11 Day 1 of FOLFIRINOX  09/24/2021: Cycle 12 Day 1 of FOLFIRINOX   Interval History:  [name] [name] 61 y.o. male with medical history significant for borderline resectable pancreatic cancer presents for a follow up visit. The patient's last visit was on 09/24/2021. He presents s/p cycle 12 of FOLFIRINOX.  On exam today [name] reports he has an upcoming cardiac evaluation at [facility] prior to his surgery.  He reports that his taste is back and he is enjoying all food.  He notes that he is not having any issues with nausea, vomiting, or  diarrhea.  He notes that his foot is doing better and he is under the care of the wound center.  He notes that there is still occasional drainage from the foot wound.  He notes that his energy levels have been okay and overall he is feeling quite well.  He is looking forward to his surgery at [facility]..  He denies any fevers, chills, night sweats, shortness of breath or chest pain.  He has no other complaints. Full 10 point ROS is listed below.    MEDICAL HISTORY:  Past Medical History:  Diagnosis Date   Barrett's esophagus dx 2016   Bronchitis    Chronic hepatitis C without hepatic coma ([place]) 10/31/2014   Depression    Diabetes ([place])    ED (erectile dysfunction)    GERD (gastroesophageal reflux disease)    Hepatitis C    Hiatal hernia 10/2014   3cm   Neuropathy    S/P transmetatarsal amputation of foot, left ([place]) 11/28/2020    SURGICAL HISTORY: Past Surgical History:  Procedure Laterality Date   AMPUTATION Left 11/15/2020   Procedure: LEFT TRANSMETATARSALS AMPUTATION;  Surgeon: Newt Minion, MD;  Location: [facility];  Service: Orthopedics;  Laterality: Left;   APPENDECTOMY     BILIARY STENT PLACEMENT N/A 02/27/2021   Procedure: BILIARY STENT PLACEMENT;  Surgeon: Milus Banister, MD;  Location: [facility];  Service: Endoscopy;  Laterality: N/A;   BILIARY STENT PLACEMENT N/A 03/27/2021   Procedure: BILIARY STENT PLACEMENT;  Surgeon: Jackquline Denmark, MD;  Location: [facility];  Service: Endoscopy;  Laterality: N/A;   BIOPSY  03/27/2021   Procedure:  BIOPSY;  Surgeon: Jackquline Denmark, MD;  [facility];  Service: Endoscopy;;   COLONOSCOPY N/A 02/16/2014   Procedure: COLONOSCOPY;  Surgeon: Gatha Mayer, MD;  [facility];  Service: Endoscopy;  Laterality: N/A;   COLONOSCOPY     ENDOSCOPIC RETROGRADE CHOLANGIOPANCREATOGRAPHY (ERCP) WITH PROPOFOL N/A 02/27/2021   Procedure: ENDOSCOPIC RETROGRADE CHOLANGIOPANCREATOGRAPHY (ERCP) WITH PROPOFOL;  Surgeon:  Milus Banister, MD;  [facility];  Service: Endoscopy;  Laterality: N/A;   ERCP N/A 03/27/2021   Procedure: ENDOSCOPIC RETROGRADE CHOLANGIOPANCREATOGRAPHY (ERCP);  Surgeon: Jackquline Denmark, MD;  Location[facility];  Service: Endoscopy;  Laterality: N/A;   ESOPHAGOGASTRODUODENOSCOPY (EGD) WITH PROPOFOL N/A 05/06/2021   Procedure: ESOPHAGOGASTRODUODENOSCOPY (EGD) WITH PROPOFOL;  Surgeon: Mauri Pole, MD;  [facility];  Service: Endoscopy;  Laterality: N/A;   EUS N/A 02/27/2021   Procedure: UPPER ENDOSCOPIC ULTRASOUND (EUS) RADIAL;  Surgeon: Milus Banister, MD;  [facility];  Service: Endoscopy;  Laterality: N/A;   FINE NEEDLE ASPIRATION N/A 02/27/2021   Procedure: FINE NEEDLE ASPIRATION (FNA) LINEAR;  Surgeon: Milus Banister, MD;  [facility];  Service: Endoscopy;  Laterality: N/A;   IR IMAGING GUIDED PORT INSERTION  03/21/2021   SPHINCTEROTOMY  02/27/2021   Procedure: SPHINCTEROTOMY;  Surgeon: Milus Banister, MD;  [facility];  Service: Endoscopy;;   STENT REMOVAL  03/27/2021   Procedure: Lavell Islam REMOVAL;  Surgeon: Jackquline Denmark, MD;  [facility];  Service: Endoscopy;;   TONSILLECTOMY      SOCIAL HISTORY: Social History   Socioeconomic History   Marital status: Single    Spouse name: Not on file   Number of children: Not on file   Years of education: Not on file   Highest education level: Not on file  Occupational History   Not on file  Tobacco Use   Smoking status: Every Day    Packs/day: 0.50    Years: 35.00    Pack years: 17.50    Types: Cigarettes   Smokeless tobacco: Never   Tobacco comments:    Currently being followed by the smoking cessation program   Vaping Use   Vaping Use: Never used  Substance and Sexual Activity   Alcohol use: Not Currently    Comment: previous   Drug use: No   Sexual activity: Not on file  Other Topics Concern   Not on file  Social History Narrative   Not on file    Social Determinants of Health   Financial Resource Strain: High Risk   Difficulty of Paying Living Expenses: Very hard  Food Insecurity: Food Insecurity Present   Worried About Running Out of Food in the Last Year: Sometimes true   Arboriculturist in the Last Year: Sometimes true  Transportation Needs: Unmet Transportation Needs   Lack of Transportation (Medical): Yes   Lack of Transportation (Non-Medical): Yes  Physical Activity: Not on file  Stress: Not on file  Social Connections: Not on file  Intimate Partner Violence: Not on file    FAMILY HISTORY: Family History  Problem Relation Age of Onset   Breast cancer Mother    Stroke Father    Alcohol abuse Father    Heart disease Maternal Grandfather    Pancreatic cancer Paternal Grandmother    Diabetes Paternal Grandfather    Colon cancer Neg Hx    Stomach cancer Neg Hx    Rectal cancer Neg Hx    Esophageal cancer Neg Hx     ALLERGIES:  is allergic to bee venom and lactose intolerance (gi).  MEDICATIONS:  Current Outpatient Medications  Medication Sig Dispense Refill   doxycycline (MONODOX) 100 MG capsule Take by mouth.     Accu-Chek Softclix Lancets lancets Check blood glucose level by fingerstick 3-4 times per day. 100 each 3   albuterol (VENTOLIN HFA) 108 (90 Base) MCG/ACT inhaler Inhale 2 puffs into the lungs every 6 (six) hours as needed for wheezing or shortness of breath. 18 g 0   atorvastatin (LIPITOR) 20 MG tablet Take 1 tablet (20 mg total) by mouth daily. 90 tablet 0   Blood Glucose Monitoring Suppl (ACCU-CHEK GUIDE) w/Device KIT Check blood glucose level by fingerstick 3-4 times per day. 1 kit 0   chlorhexidine (PERIDEX) 0.12 % solution Rinse between teeth using irrigation syringe (15 mL) 2-3 times a day or as needed for gum irritation. 473 mL 2   diphenoxylate-atropine (LOMOTIL) 2.5-0.025 MG tablet Take 1 tablet by mouth 4 (four) times daily as needed for diarrhea or loose stools. 60 tablet 1   DULoxetine  (CYMBALTA) 60 MG capsule Take 1 capsule (60 mg total) by mouth daily. 90 capsule 0   EPINEPHrine 0.3 mg/0.3 mL IJ SOAJ injection Inject 0.3 mg into the muscle once as needed for anaphylaxis.     folic acid (FOLVITE) 1 MG tablet Take 1 tablet (1 mg total) by mouth in the morning.     glucose blood (ACCU-CHEK GUIDE) test strip Check blood glucose level by fingerstick 3 - 4 times per day. 100 each 12   Insulin NPH, Human,, Isophane, (HUMULIN N KWIKPEN) 100 UNIT/ML Kiwkpen Inject 35 Units into the skin every morning. 45 mL 3   Insulin Pen Needle (TECHLITE PEN NEEDLES) 32G X 4 MM MISC USE AS INSTRUCTED INTO THE SKIN TWICE DAILY 100 each 11   Insulin Pen Needle (TRUEPLUS PEN NEEDLES) 32G X 4 MM MISC Use as instructed. Inject into the skin twice daily 200 each 6   Insulin Syringe-Needle U-100 (TRUEPLUS INSULIN SYRINGE) 31G X 5/16" 0.3 ML MISC Use to inject insulin once daily. 100 each 6   lidocaine-prilocaine (EMLA) cream Apply 1 application topically as directed as needed. 30 g 0   lipase/protease/amylase (CREON) 36000 UNITS CPEP capsule Take 2 capsules (72,000 Units total) by mouth 3 (three) times daily with meals. May also take 1 capsule (36,000 Units total) as needed (with snacks). 210 capsule 3   losartan (COZAAR) 50 MG tablet Take 1 tablet (50 mg total) by mouth daily. 90 tablet 1   metoCLOPramide (REGLAN) 5 MG tablet Take 1 tablet (5 mg total) by mouth 4 (four) times daily -  before meals and at bedtime. 90 tablet 1   mirtazapine (REMERON) 30 MG tablet Take 1 tablet (30 mg total) by mouth at bedtime. 90 tablet 1   Multiple Vitamins-Minerals (THEREMS-M) TABS Take 1 tablet by mouth in the morning. 90 tablet 3   nicotine (NICODERM CQ - DOSED IN MG/24 HOURS) 14 mg/24hr patch Apply one patch daily x 12 weeks. 84 patch 1   ondansetron (ZOFRAN) 8 MG tablet Take 1 tablet (8 mg total) by mouth 2 (two) times daily as needed. Start on day 3 after chemotherapy. 30 tablet 1   oxyCODONE (OXY IR/ROXICODONE) 5 MG  immediate release tablet Take 2 tablets (10 mg total) by mouth every 6 (six) hours as needed for severe pain. 120 tablet 0   pantoprazole (PROTONIX) 40 MG tablet Take 1 tablet (40 mg total) by mouth daily. 90 tablet 1  polyethylene glycol powder (GLYCOLAX/MIRALAX) 17 GM/SCOOP powder Take 17 g by mouth at bedtime. 238 g 0   prochlorperazine (COMPAZINE) 10 MG tablet Take 1 tablet (10 mg total) by mouth every 6 (six) hours as needed for nausea or vomiting. 30 tablet 1   Syringe, Disposable, 3 ML MISC Use As Directed 100 each 4   varenicline (APO-VARENICLINE) 1 MG tablet Take 1 tablet by mouth once daily in the morning with food 84 tablet 1   No current facility-administered medications for this visit.    REVIEW OF SYSTEMS:   Constitutional: ( - ) fevers, ( - )  chills , ( - ) night sweats Eyes: ( - ) blurriness of vision, ( - ) double vision, ( - ) watery eyes Ears, nose, mouth, throat, and face: ( - ) mucositis, ( - ) sore throat Respiratory: ( - ) cough, ( - ) dyspnea, ( - ) wheezes Cardiovascular: ( - ) palpitation, ( - ) chest discomfort, ( - ) lower extremity swelling Gastrointestinal:  ( +) nausea, ( - ) heartburn, ( - ) change in bowel habits Skin: ( - ) abnormal skin rashes Lymphatics: ( - ) new lymphadenopathy, ( - ) easy bruising Neurological: ( - ) numbness, ( - ) tingling, ( - ) new weaknesses Behavioral/Psych: ( - ) mood change, ( - ) new changes  All other systems were reviewed with the patient and are negative.  PHYSICAL EXAMINATION: ECOG PERFORMANCE STATUS: 1 - Symptomatic but completely ambulatory  Vitals:   10/22/21 0949  BP: 139/88  Pulse: 77  Resp: 18  Temp: (!) 97.4 F (36.3 C)  SpO2: 99%    Filed Weights   10/22/21 0949  Weight: 147 lb 4 oz (66.8 kg)   GENERAL: Well-appearing middle-age Caucasian male, alert, no distress and comfortable SKIN: skin color, texture, turgor are normal, no rashes or significant lesions.  EYES: conjunctiva are pink and  non-injected, sclera clear LUNGS: clear to auscultation and percussion with normal breathing effort HEART: regular rate & rhythm and no murmurs and no lower extremity edema ABDOMEN: mild tenderness to palpation in the epigastric region. No guarding.  EXTREMITIES: wound currently dressed.  PSYCH: alert & oriented x 3, fluent speech NEURO: no focal motor/sensory deficits  LABORATORY DATA:  I have reviewed the data as listed CBC Latest Ref Rng & Units 10/22/2021 09/24/2021 09/10/2021  WBC 4.0 - 10.5 K/uL 10.4 8.6 8.1  Hemoglobin 13.0 - 17.0 g/dL 12.0(L) 11.8(L) 12.3(L)  Hematocrit 39.0 - 52.0 % 36.2(L) 34.7(L) 36.5(L)  Platelets 150 - 400 K/uL 165 110(L) 124(L)    CMP Latest Ref Rng & Units 10/22/2021 09/24/2021 09/10/2021  Glucose 70 - 99 mg/dL 354(H) 118(H) 291(H)  BUN 6 - 20 mg/dL _0 Creatinine 0.61 - 1.24 mg/dL 0.60(L) 0.56(L) 0.55(L)  Sodium 135 - 145 mmol/L 133(L) 137 134(L)  Potassium 3.5 - 5.1 mmol/L 5.1 3.6 3.5  Chloride 98 - 111 mmol/L 101 104 102  CO2 22 - 32 mmol/L _1 Calcium 8.9 - 10.3 mg/dL 9.2 9.5 9.1  Total Protein 6.5 - 8.1 g/dL 7.7 7.9 7.7  Total Bilirubin 0.3 - 1.2 mg/dL 0.4 0.4 0.4  Alkaline Phos 38 - 126 U/L 115 90 90  AST 15 - 41 U/L _2 ALT 0 - 44 U/L _3 RADIOGRAPHIC STUDIES: I have personally reviewed the radiological images as listed and agreed with the findings in the report: Borderline resectable pancreatic mass  with no evidence of metastatic disease. No results found.  ASSESSMENT & PLAN [name] [name] 61 y.o. male with medical history significant for borderline resectable pancreatic cancer presents for a follow up visit.   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  The patient does still have an elevation in bilirubin which likely represents a decline from the time of the latest stent placement.  He  was evaluated by surgery who determined this is a borderline resectable tumor.  We will plan to start the patient on neoadjuvant chemotherapy.  The 2 options would be Gem/Abraxane or FOLFIRINOX.  Given his excellent functional status I do believe he would be a good candidate for FOLFIRINOX.  He completed 12 cycles of neoadjuvant FOLFIRINOX chemotherapy with plans to undergo surgery on 10/28/2021 at [facility].  # Adenocarcinoma of the Pancreas, Stage IIB -- At this time disease appears borderline resectable.  We will proceed with neoadjuvant chemotherapy.  At this time would prefer a regimen of FOLFIRINOX given his good baseline health. -- case reviewed by surgery, who agrees his disease is borderline resectable.  -- baseline elevations in both CA 19-9 and CEA. Continue to monitor during treatment.  -- Cycle 1 Day 1 of FOLFIRINOX started 04/10/2021. Cycle 12 Day 1 on 09/24/2021, completed neoadjuvant chemotherapy.  Plan:  --Most recent CT scans from 07/15/2021 showed primary tumor is stable in size but still abuts the SMV.  --Dr. Zenia Resides has referred patient to surgical oncology at [facility], evaluated by Dr. Hyman Hopes who would like for patient to complete full 6 months of treatment prior to surgery.  Discussed and reaffirmed this plan with him.   --Patient scheduled for surgery at [facility] on 10/28/21, he is currently undergoing preoperative evaluation. -- RTC in 4 weeks after surgery to re-evaluate.   #Gastroparesis #Nausea/Vomiting/Poor PO Intake --patient advanced diet to solid foods.  --nausea meds as below. --continue to work on glycemic control --continue to monitor.   #Chemotherapy induced Anemia and Thrombocytopenia --Hgb is stable at 12.0, continue to monitor --Plt increased to 165 K, no signs of bleeding. --ok to proceed with treatment --continue to monitor   # Pain Control -- Patient currently taking ibuprofen for his abdominal pain --continue oxycodone 10 mg every 6 hours as  needed  #Hyperglycemia: --today's glucose level is 354 --insulin-requiring type 2 DM --under the care of endocrinology  #Left foot wound: -- He has established care with wound care center -- Foot is currently wrapped and wound not visible.Marland Kitchen  #Supportive Care -- chemotherapy education complete -- port placed -- zofran 57m q8H PRN and compazine 124mPO q6H for nausea --trial of Creon called into pharmacy to help with loose stools. --lomotil q 6 H PRN for diarrhea.  -- EMLA cream for port  No orders of the defined types were placed in this encounter.   All questions were answered. The patient knows to call the clinic with any problems, questions or concerns.  I have spent a total of 30 minutes minutes of face-to-face and non-face-to-face time, preparing to see the patient, performing a medically appropriate examination, counseling and educating the patient, documenting clinical information in the electronic health record, independently interpreting results and communicating results to the patient, and care coordination.   JoLedell PeoplesMD Department of Hematology/Oncology CoAbbotsfordt WeCook Medical Centerhone: [phone]: [phone]: [email or web address]  10/28/2021 9:32 AM

## 2021-10-28 ENCOUNTER — Encounter: Payer: Self-pay | Admitting: Hematology and Oncology

## 2021-10-29 ENCOUNTER — Other Ambulatory Visit: Payer: Self-pay

## 2021-10-30 ENCOUNTER — Other Ambulatory Visit: Payer: Self-pay

## 2021-10-31 ENCOUNTER — Telehealth: Payer: Self-pay | Admitting: Physician Assistant

## 2021-10-31 NOTE — Telephone Encounter (Signed)
.  Called patient to schedule appointment per e/e 3/3 inbasket, patient is aware of date and time.  ? ?

## 2021-11-07 ENCOUNTER — Other Ambulatory Visit (HOSPITAL_COMMUNITY): Payer: Self-pay

## 2021-11-07 ENCOUNTER — Other Ambulatory Visit: Payer: Self-pay | Admitting: Hematology and Oncology

## 2021-11-10 ENCOUNTER — Other Ambulatory Visit: Payer: Self-pay | Admitting: Physician Assistant

## 2021-11-10 ENCOUNTER — Encounter: Payer: Self-pay | Admitting: Hematology and Oncology

## 2021-11-10 ENCOUNTER — Telehealth: Payer: Self-pay | Admitting: *Deleted

## 2021-11-10 ENCOUNTER — Other Ambulatory Visit (HOSPITAL_COMMUNITY): Payer: Self-pay

## 2021-11-10 MED ORDER — OXYCODONE HCL 5 MG PO TABS
10.0000 mg | ORAL_TABLET | Freq: Four times a day (QID) | ORAL | 0 refills | Status: DC | PRN
  Filled 2021-11-10: qty 120, 15d supply, fill #0

## 2021-11-10 NOTE — Telephone Encounter (Signed)
Received call from pt requesting refill of his Oxycodone 5 mg tab. Last filled on 10/14/21 for 120 mg ? ?Pt had surgery @ [facility] on approx 10/28/21 and was in patient for 6 days. He states he is doing well except for needing his oxy refilled. ?Pt uses [facility] ? ?

## 2021-11-11 ENCOUNTER — Other Ambulatory Visit: Payer: Self-pay

## 2021-11-11 ENCOUNTER — Encounter (HOSPITAL_BASED_OUTPATIENT_CLINIC_OR_DEPARTMENT_OTHER): Payer: Medicaid Other | Attending: Internal Medicine | Admitting: General Surgery

## 2021-11-11 DIAGNOSIS — E1142 Type 2 diabetes mellitus with diabetic polyneuropathy: Secondary | ICD-10-CM | POA: Diagnosis not present

## 2021-11-11 DIAGNOSIS — Z89432 Acquired absence of left foot: Secondary | ICD-10-CM | POA: Insufficient documentation

## 2021-11-11 DIAGNOSIS — E11621 Type 2 diabetes mellitus with foot ulcer: Secondary | ICD-10-CM | POA: Diagnosis present

## 2021-11-11 DIAGNOSIS — B182 Chronic viral hepatitis C: Secondary | ICD-10-CM | POA: Insufficient documentation

## 2021-11-11 DIAGNOSIS — K2270 Barrett's esophagus without dysplasia: Secondary | ICD-10-CM | POA: Insufficient documentation

## 2021-11-11 DIAGNOSIS — F17210 Nicotine dependence, cigarettes, uncomplicated: Secondary | ICD-10-CM | POA: Insufficient documentation

## 2021-11-11 DIAGNOSIS — Z7984 Long term (current) use of oral hypoglycemic drugs: Secondary | ICD-10-CM | POA: Diagnosis not present

## 2021-11-11 DIAGNOSIS — C259 Malignant neoplasm of pancreas, unspecified: Secondary | ICD-10-CM | POA: Diagnosis not present

## 2021-11-11 DIAGNOSIS — K219 Gastro-esophageal reflux disease without esophagitis: Secondary | ICD-10-CM | POA: Insufficient documentation

## 2021-11-11 DIAGNOSIS — L97528 Non-pressure chronic ulcer of other part of left foot with other specified severity: Secondary | ICD-10-CM | POA: Insufficient documentation

## 2021-11-11 DIAGNOSIS — F32A Depression, unspecified: Secondary | ICD-10-CM | POA: Diagnosis not present
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
## 2021-11-14 ENCOUNTER — Other Ambulatory Visit: Payer: Self-pay

## 2021-11-14 MED ORDER — AMOXICILLIN-POT CLAVULANATE 875-125 MG PO TABS
ORAL_TABLET | ORAL | 0 refills | Status: DC
  Filled 2021-11-14: qty 14, 7d supply, fill #0

## 2021-11-17 ENCOUNTER — Other Ambulatory Visit: Payer: Self-pay | Admitting: Physician Assistant

## 2021-11-17 DIAGNOSIS — C250 Malignant neoplasm of head of pancreas: Secondary | ICD-10-CM

## 2021-11-18 ENCOUNTER — Encounter (HOSPITAL_BASED_OUTPATIENT_CLINIC_OR_DEPARTMENT_OTHER): Payer: Medicaid Other | Admitting: General Surgery

## 2021-11-18 ENCOUNTER — Encounter (HOSPITAL_COMMUNITY): Payer: Self-pay

## 2021-11-18 ENCOUNTER — Emergency Department (HOSPITAL_COMMUNITY): Payer: Medicaid Other

## 2021-11-18 ENCOUNTER — Other Ambulatory Visit: Payer: Self-pay

## 2021-11-18 ENCOUNTER — Emergency Department (HOSPITAL_COMMUNITY)
Admission: EM | Admit: 2021-11-18 | Discharge: 2021-11-18 | Disposition: A | Discharge status: Home / Self Care | Payer: Medicaid Other | Attending: Emergency Medicine | Admitting: Emergency Medicine

## 2021-11-18 ENCOUNTER — Inpatient Hospital Stay: Payer: Medicaid Other | Attending: Hematology and Oncology

## 2021-11-18 ENCOUNTER — Inpatient Hospital Stay (HOSPITAL_BASED_OUTPATIENT_CLINIC_OR_DEPARTMENT_OTHER): Payer: Medicaid Other | Admitting: Physician Assistant

## 2021-11-18 VITALS — BP 135/80 | HR 116 | Temp 98.1°F | Resp 18 | Wt 138.4 lb

## 2021-11-18 DIAGNOSIS — Z452 Encounter for adjustment and management of vascular access device: Secondary | ICD-10-CM | POA: Diagnosis present

## 2021-11-18 DIAGNOSIS — E119 Type 2 diabetes mellitus without complications: Secondary | ICD-10-CM | POA: Diagnosis not present

## 2021-11-18 DIAGNOSIS — Z794 Long term (current) use of insulin: Secondary | ICD-10-CM | POA: Diagnosis not present

## 2021-11-18 DIAGNOSIS — F17210 Nicotine dependence, cigarettes, uncomplicated: Secondary | ICD-10-CM | POA: Diagnosis not present

## 2021-11-18 DIAGNOSIS — E11621 Type 2 diabetes mellitus with foot ulcer: Secondary | ICD-10-CM | POA: Diagnosis not present

## 2021-11-18 DIAGNOSIS — Z8507 Personal history of malignant neoplasm of pancreas: Secondary | ICD-10-CM | POA: Insufficient documentation

## 2021-11-18 DIAGNOSIS — Z7984 Long term (current) use of oral hypoglycemic drugs: Secondary | ICD-10-CM | POA: Diagnosis not present

## 2021-11-18 DIAGNOSIS — Y9301 Activity, walking, marching and hiking: Secondary | ICD-10-CM | POA: Diagnosis not present

## 2021-11-18 DIAGNOSIS — I10 Essential (primary) hypertension: Secondary | ICD-10-CM | POA: Diagnosis not present

## 2021-11-18 DIAGNOSIS — W01198A Fall on same level from slipping, tripping and stumbling with subsequent striking against other object, initial encounter: Secondary | ICD-10-CM | POA: Diagnosis not present

## 2021-11-18 DIAGNOSIS — Z79899 Other long term (current) drug therapy: Secondary | ICD-10-CM | POA: Diagnosis not present

## 2021-11-18 DIAGNOSIS — S0191XA Laceration without foreign body of unspecified part of head, initial encounter: Secondary | ICD-10-CM

## 2021-11-18 DIAGNOSIS — S0990XA Unspecified injury of head, initial encounter: Secondary | ICD-10-CM

## 2021-11-18 DIAGNOSIS — S0993XA Unspecified injury of face, initial encounter: Secondary | ICD-10-CM | POA: Diagnosis present

## 2021-11-18 DIAGNOSIS — T8149XA Infection following a procedure, other surgical site, initial encounter: Secondary | ICD-10-CM | POA: Diagnosis not present

## 2021-11-18 DIAGNOSIS — S0181XA Laceration without foreign body of other part of head, initial encounter: Secondary | ICD-10-CM | POA: Diagnosis not present

## 2021-11-18 DIAGNOSIS — C250 Malignant neoplasm of head of pancreas: Secondary | ICD-10-CM | POA: Insufficient documentation

## 2021-11-18 DIAGNOSIS — Z7982 Long term (current) use of aspirin: Secondary | ICD-10-CM | POA: Diagnosis not present

## 2021-11-18 DIAGNOSIS — R000 Tachycardia, unspecified: Secondary | ICD-10-CM | POA: Insufficient documentation

## 2021-11-18 DIAGNOSIS — D72825 Bandemia: Secondary | ICD-10-CM

## 2021-11-18 LAB — CMP (CANCER CENTER ONLY)
ALT: 10 U/L (ref 0–44)
AST: 16 U/L (ref 15–41)
Albumin: 3.8 g/dL (ref 3.5–5.0)
Alkaline Phosphatase: 134 U/L — ABNORMAL HIGH (ref 38–126)
Anion gap: 7 (ref 5–15)
BUN: 8 mg/dL (ref 6–20)
CO2: 30 mmol/L (ref 22–32)
Calcium: 9.6 mg/dL (ref 8.9–10.3)
Chloride: 94 mmol/L — ABNORMAL LOW (ref 98–111)
Creatinine: 0.55 mg/dL — ABNORMAL LOW (ref 0.61–1.24)
GFR, Estimated: >60 mL/min (ref >60)
Glucose, Bld: 219 mg/dL — ABNORMAL HIGH (ref 70–99)
Potassium: 4.3 mmol/L (ref 3.5–5.1)
Sodium: 131 mmol/L — ABNORMAL LOW (ref 135–145)
Total Bilirubin: 0.4 mg/dL (ref 0.3–1.2)
Total Protein: 8.5 g/dL — ABNORMAL HIGH (ref 6.5–8.1)

## 2021-11-18 LAB — CBC WITH DIFFERENTIAL (CANCER CENTER ONLY)
Abs Immature Granulocytes: 0.06 10*3/uL (ref 0.00–0.07)
Basophils Absolute: 0.1 10*3/uL (ref 0.0–0.1)
Basophils Relative: 1 %
Eosinophils Absolute: 0.2 10*3/uL (ref 0.0–0.5)
Eosinophils Relative: 1 %
HCT: 35.3 % — ABNORMAL LOW (ref 39.0–52.0)
Hemoglobin: 11.5 g/dL — ABNORMAL LOW (ref 13.0–17.0)
Immature Granulocytes: 0 %
Lymphocytes Relative: 15 %
Lymphs Abs: 2.4 10*3/uL (ref 0.7–4.0)
MCH: 30.8 pg (ref 26.0–34.0)
MCHC: 32.6 g/dL (ref 30.0–36.0)
MCV: 94.6 fL (ref 80.0–100.0)
Monocytes Absolute: 1.9 10*3/uL — ABNORMAL HIGH (ref 0.1–1.0)
Monocytes Relative: 12 %
Neutro Abs: 11.2 10*3/uL — ABNORMAL HIGH (ref 1.7–7.7)
Neutrophils Relative %: 71 %
Platelet Count: 345 10*3/uL (ref 150–400)
RBC: 3.73 MIL/uL — ABNORMAL LOW (ref 4.22–5.81)
RDW: 13.4 % (ref 11.5–15.5)
WBC Count: 15.9 10*3/uL — ABNORMAL HIGH (ref 4.0–10.5)
nRBC: 0 % (ref 0.0–0.2)

## 2021-11-18 MED ORDER — LIDOCAINE-EPINEPHRINE (PF) 2 %-1:200000 IJ SOLN
20.0000 mL | Freq: Once | INTRAMUSCULAR | Status: AC
  Administered 2021-11-18: 20 mL
  Filled 2021-11-18: qty 20

## 2021-11-18 NOTE — ED Provider Notes (Signed)
?[facility] DEPT ?Provider Note ? ? ?CSN: [id number] ?Arrival date & time: 11/18/21  1126 ? ?  ? ?History ? ?Chief Complaint  ?Patient presents with  ? Fall  ? Head Laceration  ? ? ?[name] [name] is a 61 y.o. male. ? ?61 year old male with past medical history significant of pancreatic cancer he is status post Whipple who presents today for evaluation of a fall that occurred around 10 AM yesterday morning.  Patient states he was walking in his living room when he tripped over his rug and fell and hit his head on a TV stand.  He has had a laceration which has been bleeding since then.  He states he has applied pressure however the wound has remained too loose.  Patient had an appointment this morning with his oncologist and was sent to the emergency room for further evaluation.  He denies any chest pain, shortness of breath, lightheadedness, dizziness that preceded or followed his fall.  He denies loss of consciousness.  He states he is taking aspirin and heparin given that he is postop.  States he had a headache yesterday but currently denies headache, visual change, or any other complaints.  Denies any other injuries. ? ?The history is provided by the patient. No language interpreter was used.  ? ?  ? ?Home Medications ?Prior to Admission medications   ?Medication Sig Start Date End Date Taking? Authorizing Provider  ?Accu-Chek Softclix Lancets lancets Check blood glucose level by fingerstick 3-4 times per day. 07/04/21   Charlott Rakes, MD  ?albuterol (VENTOLIN HFA) 108 (90 Base) MCG/ACT inhaler Inhale 2 puffs into the lungs every 6 (six) hours as needed for wheezing or shortness of breath. 12/05/20   Gerlene Fee, NP  ?amoxicillin-clavulanate (AUGMENTIN) 875-125 MG tablet Take 1 tablet (875 mg total) by mouth every 12 (twelve) hours for 7 days 11/14/21     ?aspirin EC 81 MG tablet Take 81 mg by mouth daily. Swallow whole.    [provider]  ?atorvastatin (LIPITOR) 20 MG  tablet Take 1 tablet (20 mg total) by mouth daily. 10/02/21 12/31/21  [name], NP  ?Blood Glucose Monitoring Suppl (ACCU-CHEK GUIDE) w/Device KIT Check blood glucose level by fingerstick 3-4 times per day. 07/04/21   Charlott Rakes, MD  ?chlorhexidine (PERIDEX) 0.12 % solution Rinse between teeth using irrigation syringe (15 mL) 2-3 times a day or as needed for gum irritation. 09/12/21   Sandi Mariscal B, DMD  ?diphenoxylate-atropine (LOMOTIL) 2.5-0.025 MG tablet Take 1 tablet by mouth 4 (four) times daily as needed for diarrhea or loose stools. 10/08/21   [name], NP  ?doxycycline (MONODOX) 100 MG capsule Take by mouth. 05/20/20   [provider]  ?DULoxetine (CYMBALTA) 60 MG capsule Take 1 capsule (60 mg total) by mouth daily. 09/10/21 12/09/21  [name], NP  ?enoxaparin (LOVENOX) 40 MG/0.4ML injection Inject 40 mg into the skin daily.  11/26/21  [provider]  ?EPINEPHrine 0.3 mg/0.3 mL IJ SOAJ injection Inject 0.3 mg into the muscle once as needed for anaphylaxis. 06/11/20   [provider]  ?folic acid (FOLVITE) 1 MG tablet Take 1 tablet (1 mg total) by mouth in the morning. 03/28/21   Rai, Ripudeep K, MD  ?glucose blood (ACCU-CHEK GUIDE) test strip Check blood glucose level by fingerstick 3 - 4 times per day. 07/04/21   Charlott Rakes, MD  ?Insulin NPH, Human,, Isophane, (HUMULIN N KWIKPEN) 100 UNIT/ML Kiwkpen Inject 35 Units into the  skin every morning. 09/03/21   Renato Shin, MD  ?Insulin Pen Needle (TECHLITE PEN NEEDLES) 32G X 4 MM MISC USE AS INSTRUCTED INTO THE SKIN TWICE DAILY 03/04/21   [name], NP  ?Insulin Pen Needle (TRUEPLUS PEN NEEDLES) 32G X 4 MM MISC Use as instructed. Inject into the skin twice daily 03/04/21   [name], NP  ?Insulin Syringe-Needle U-100 (TRUEPLUS INSULIN SYRINGE) 31G X 5/16" 0.3 ML MISC Use to inject insulin once daily. 07/31/21   Charlott Rakes, MD  ?lidocaine-prilocaine (EMLA) cream Apply 1 application topically as  directed as needed. 08/07/21   Lincoln Brigham, PA-C  ?lipase/protease/amylase (CREON) 36000 UNITS CPEP capsule Take 2 capsules (72,000 Units total) by mouth 3 (three) times daily with meals. May also take 1 capsule (36,000 Units total) as needed (with snacks). 04/07/21   [name], NP  ?losartan (COZAAR) 50 MG tablet Take 1 tablet (50 mg total) by mouth daily. 05/19/21 11/10/21  [name], NP  ?metoCLOPramide (REGLAN) 5 MG tablet Take 1 tablet (5 mg total) by mouth 4 (four) times daily -  before meals and at bedtime. 05/08/21 07/04/21  Hosie Poisson, MD  ?mirtazapine (REMERON) 30 MG tablet Take 1 tablet (30 mg total) by mouth at bedtime. 07/04/21 02/05/22  Charlott Rakes, MD  ?Multiple Vitamins-Minerals (THEREMS-M) TABS Take 1 tablet by mouth in the morning. 07/04/21   [name], NP  ?nicotine (NICODERM CQ - DOSED IN MG/24 HOURS) 14 mg/24hr patch Apply one patch daily x 12 weeks. 10/08/21     ?ondansetron (ZOFRAN) 8 MG tablet Take 1 tablet (8 mg total) by mouth 2 (two) times daily as needed. Start on day 3 after chemotherapy. 08/07/21   Lincoln Brigham, PA-C  ?oxyCODONE (OXY IR/ROXICODONE) 5 MG immediate release tablet Take 2 tablets (10 mg total) by mouth every 6 (six) hours as needed for severe pain. 11/10/21   Lincoln Brigham, PA-C  ?pantoprazole (PROTONIX) 40 MG tablet Take 1 tablet (40 mg total) by mouth daily. 07/08/21 01/05/22  [name], NP  ?polyethylene glycol powder (GLYCOLAX/MIRALAX) 17 GM/SCOOP powder Take 17 g by mouth at bedtime. 05/08/21   Hosie Poisson, MD  ?prochlorperazine (COMPAZINE) 10 MG tablet Take 1 tablet (10 mg total) by mouth every 6 (six) hours as needed for nausea or vomiting. 08/07/21   Lincoln Brigham, PA-C  ?Syringe, Disposable, 3 ML MISC Use As Directed 07/31/21   Renato Shin, MD  ?varenicline (APO-VARENICLINE) 1 MG tablet Take 1 tablet by mouth once daily in the morning with food 08/26/21     ?   ? ?Allergies    ?Bee venom and Lactose intolerance (gi)   ? ?Review of Systems    ?Review of Systems  ?Constitutional:  Negative for chills and fever.  ?Cardiovascular:  Negative for palpitations.  ?Skin:  Positive for wound.  ?Neurological:  Negative for weakness and light-headedness.  ?Hematological:  Bruises/bleeds easily.  ?All other systems reviewed and are negative. ? ?Physical Exam ?Updated Vital Signs ?BP 103/68 (BP Location: Right Arm)   Pulse (!) 109   Temp 98.1 ?F (36.7 ?C) (Oral)   Resp 18   Ht '5\' 11"'  (1.803 m)   Wt 62.8 kg   SpO2 98%   BMI 19.30 kg/m?  ?Physical Exam ?Vitals and nursing note reviewed.  ?Constitutional:   ?   General: He is not in acute distress. ?   Appearance: Normal appearance. He is not ill-appearing.  ?HENT:  ?  Head: Normocephalic and atraumatic.  ?   Nose: Nose normal.  ?Eyes:  ?   General: No scleral icterus. ?   Extraocular Movements: Extraocular movements intact.  ?   Conjunctiva/sclera: Conjunctivae normal.  ?   Pupils: Pupils are equal, round, and reactive to light.  ?Cardiovascular:  ?   Rate and Rhythm: Regular rhythm. Tachycardia present.  ?   Pulses: Normal pulses.  ?   Heart sounds: Normal heart sounds.  ?Pulmonary:  ?   Effort: Pulmonary effort is normal. No respiratory distress.  ?   Breath sounds: Normal breath sounds. No wheezing or rales.  ?Abdominal:  ?   Comments: Well-healed incision on the midline of his abdomen.  ?Musculoskeletal:     ?   General: Normal range of motion.  ?   Cervical back: Normal range of motion.  ?Skin: ?   General: Skin is warm and dry.  ?   Comments: 3 cm laceration to left side of the forehead.  Wound is actively oozing blood.  No signs of infection noted.  ?Neurological:  ?   General: No focal deficit present.  ?   Mental Status: He is alert and oriented to person, place, and time. Mental status is at baseline.  ?   Cranial Nerves: No cranial nerve deficit.  ?   Motor: No weakness.  ?   Gait: Gait normal.  ? ? ?ED Results / Procedures / Treatments   ?Labs ?(all labs ordered are listed, but only abnormal  results are displayed) ?Labs Reviewed - No data to display ? ?EKG ?None ? ?Radiology ?No results found. ? ?Procedures ?Marland Kitchen.Laceration Repair ? ?Date/Time: 11/18/2021 2:58 PM ?Performed by: Evlyn Courier, PA-C ?Authorized b

## 2021-11-18 NOTE — Progress Notes (Signed)
[name], [name] [name] ([id number]) ?Visit Report for 11/18/2021 ?Fall Risk Assessment Details ?Patient Name: Date of Service: ?[name], [name] 11/18/2021 3:30 PM ?Medical Record Number: [id number] ?Patient Account Number: [id number] ?Date of Birth/Sex: Treating RN: ?June 07, 1961 (61 y.o. [name] ?Primary Care Enzley Kitchens: [name] Other Clinician: ?Referring Aceton Kinnear: ?Treating Drevion Offord/Extender: [name] ?[name] ?Weeks in Treatment: 9 ?Fall Risk Assessment Items ?Have you had 2 or more falls in the last 12 monthso 0 No ?Have you had any fall that resulted in injury in the last 12 monthso 0 Yes ?FALLS RISK SCREEN ?History of falling - immediate or within 3 months 25 Yes ?Secondary diagnosis (Do you have 2 or more medical diagnoseso) 15 Yes ?Ambulatory aid ?None/bed rest/wheelchair/nurse 0 Yes ?Crutches/cane/walker 0 No ?Furniture 0 No ?Intravenous therapy Access/Saline/Heparin Lock 0 No ?Gait/Transferring ?Normal/ bed rest/ wheelchair 0 Yes ?Weak (short steps with or without shuffle, stooped but able to lift head while walking, may seek 0 No ?support from furniture) ?Impaired (short steps with shuffle, may have difficulty arising from chair, head down, impaired 0 No ?balance) ?Mental Status ?Oriented to own ability 0 Yes ?Electronic Signature(s) ?Signed: 11/18/2021 5:54:13 PM By: Levan Hurst RN, BSN ?Entered By: Levan Hurst on 11/18/2021 15:48:05 ?

## 2021-11-18 NOTE — Discharge Instructions (Addendum)
Your laceration was repaired with 3 stitches today.  These will need to be removed in about 7 days.  If you have any signs of infection including redness, drainage, or fever please return or go to your primary care provider to be evaluated.  You can have your primary care provider remove the stitches in 7 days or you can return to the emergency room or go to an urgent care. ?

## 2021-11-18 NOTE — ED Triage Notes (Signed)
Patient was at the [facility] for an appointment and tripped on a rug and fel. Patient has a laceration to the forehead. Patient states he takes Aspirin and Heparin. ?Patient denies blurred vision or nausea. Patient states he does have a headache.  ?

## 2021-11-18 NOTE — Progress Notes (Signed)
?[place] ?[phone]   [phone] ? ?PROGRESS NOTE ? ?Patient Care Team: ?[name], NP as PCP - General (Nurse Practitioner) ?Armbruster, Carlota Raspberry, MD as Consulting Physician (Gastroenterology) ?Orson Slick, MD as Consulting Physician (Oncology) ?Royston Bake, RN as Equities trader ? ?Hematological/Oncological History ?# Adenocarcinoma of the Pancreas, Stage IIB ?02/26/2021: patient seen by GI for jaundice and pancreatic mass causing CBD obstruction. CT abdomen showed pancreatic head mass (4.4 x 3.7 cm) and enlarged portocaval lymph nodes. ?02/27/2021: ERCP with placement of a nonmetallic biliary stent. FNA of pancreatic head showed malignant cells consistent with pancreatic adenocarcinoma. ?03/05/2021: CT Chest showed no evidence of metastatic disease in the chest. ?03/12/2021: establish care with Dr. Lorenso Courier ?04/10/2021: Cycle 1 Day 1 of FOLFIRINOX ?04/24/2021: Cycle 2 Day 1 of FOLFIRINOX ?05/22/2021: Cycle 3 Day 1 of FOLFIRINOX. Delayed x 2 weeks due to hospitalization for poor po intake and dehydration due to gastroparesis. ?06/05/2021: Cycle 4 Day 1 of FOLFIRINOX ?06/19/2021: Cycle 5 Day 1 of FOLFIRINOX ?07/02/2021: Cycle 6 Day 1 of FOLFIRINOX ?07/16/2021: Cycle 7 Day 1 of FOLFIRINOX  ?07/30/2021: Cycle 8 Day 1 of FOLFIRINOX  ?08/13/2021: Cycle 9 Day 1 of FOLFIRINOX  ?08/27/2021:  Cycle 10 Day 1 of FOLFIRINOX  ?09/10/2021: Cycle 11 Day 1 of FOLFIRINOX  ?09/24/2021: Cycle 12 Day 1 of FOLFIRINOX  ?10/28/2021: Underwent open whipple with portal vein reconstruction utilizing a R deep FV graft at [facility].  ? ?Interval History:  ?[name] [name] 62 y.o. male returns for a follow up visit for pancreatic cancer. Since patient's last visit, he underwent whipple resection with Dr. Hyman Hopes at [facility] on 10/28/2021.  ? ?On exam today, Mr. Preis reports that yesterday he tripped over carpet at home and hit his head on metal furniture. He has a forehead laceration that is bleeding. He  did not seek evaluation for the head injury until his appointment today. He has applied gauze and bandage over the laceration that continues to bleeding. He reports having a headache this morning but denies any dizziness, syncopal episodes or other neurological deficits.  ? ?Patient was evaluated by [facility] on 11/14/2021 for a post op. He was started on 7 day course Augmentin due to infection at midline incision site. He reports continuous purulent discharge coming from the midline incision site. He has low grade fevers as recent as yesterday with a Tmax of 99.1 F. He reports having pressure over the midline incision site but no abdominal pain. He reports a couple of episodes of nausea and vomiting last week which resolved. He reports 1-2 episodes of loose stools/diarrhea per day since undergoing Whipple resection. He takes 2 capsules of creon per meal.  ? ?He has no other complaints. Rest of the 10 point ROS is below.  ? ? ? ?MEDICAL HISTORY:  ?Past Medical History:  ?Diagnosis Date  ? Barrett's esophagus dx 2016  ? Bronchitis   ? Chronic hepatitis C without hepatic coma ([place]) 10/31/2014  ? Depression   ? Diabetes ([place])   ? ED (erectile dysfunction)   ? GERD (gastroesophageal reflux disease)   ? Hepatitis C   ? Hiatal hernia 10/2014  ? 3cm  ? Neuropathy   ? 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 ? ?SURGICAL HISTORY: ?Past Surgical History:  ?Procedure Laterality Date  ? AMPUTATION Left 11/15/2020  ? Procedure: LEFT TRANSMETATARSALS AMPUTATION;  Surgeon: Newt Minion, MD;  Location: [facility];  Service: Orthopedics;  Laterality: Left;  ?  APPENDECTOMY    ? BILIARY STENT PLACEMENT N/A 02/27/2021  ? Procedure: BILIARY STENT PLACEMENT;  Surgeon: Milus Banister, MD;  Location: [facility];  Service: Endoscopy;  Laterality: N/A;  ? BILIARY STENT PLACEMENT N/A 03/27/2021  ? Procedure: BILIARY STENT PLACEMENT;  Surgeon: Jackquline Denmark, MD;  Location: [facility];  Service: Endoscopy;  Laterality: N/A;  ?  BIOPSY  03/27/2021  ? Procedure: BIOPSY;  Surgeon: Jackquline Denmark, MD;  Location: [facility];  Service: Endoscopy;;  ? COLONOSCOPY N/A 02/16/2014  ? Procedure: COLONOSCOPY;  Surgeon: Gatha Mayer, MD;  Location: [facility];  Service: Endoscopy;  Laterality: N/A;  ? COLONOSCOPY    ? ENDOSCOPIC RETROGRADE CHOLANGIOPANCREATOGRAPHY (ERCP) WITH PROPOFOL N/A 02/27/2021  ? Procedure: ENDOSCOPIC RETROGRADE CHOLANGIOPANCREATOGRAPHY (ERCP) WITH PROPOFOL;  Surgeon: Milus Banister, MD;  Location: [facility];  Service: Endoscopy;  Laterality: N/A;  ? ERCP N/A 03/27/2021  ? Procedure: ENDOSCOPIC RETROGRADE CHOLANGIOPANCREATOGRAPHY (ERCP);  Surgeon: Jackquline Denmark, MD;  Location: [facility];  Service: Endoscopy;  Laterality: N/A;  ? ESOPHAGOGASTRODUODENOSCOPY (EGD) WITH PROPOFOL N/A 05/06/2021  ? Procedure: ESOPHAGOGASTRODUODENOSCOPY (EGD) WITH PROPOFOL;  Surgeon: Mauri Pole, MD;  Location: [facility];  Service: Endoscopy;  Laterality: N/A;  ? EUS N/A 02/27/2021  ? Procedure: UPPER ENDOSCOPIC ULTRASOUND (EUS) RADIAL;  Surgeon: Milus Banister, MD;  Location: [facility];  Service: Endoscopy;  Laterality: N/A;  ? FINE NEEDLE ASPIRATION N/A 02/27/2021  ? Procedure: FINE NEEDLE ASPIRATION (FNA) LINEAR;  Surgeon: Milus Banister, MD;  Location: [facility];  Service: Endoscopy;  Laterality: N/A;  ? IR IMAGING GUIDED PORT INSERTION  03/21/2021  ? SPHINCTEROTOMY  02/27/2021  ? Procedure: SPHINCTEROTOMY;  Surgeon: Milus Banister, MD;  Location: [facility];  Service: Endoscopy;;  ? STENT REMOVAL  03/27/2021  ? Procedure: STENT REMOVAL;  Surgeon: Jackquline Denmark, MD;  Location: [facility];  Service: Endoscopy;;  ? TONSILLECTOMY    ? ? ?SOCIAL HISTORY: ?Social History  ? ?Socioeconomic History  ? Marital status: Single  ?  Spouse name: Not on file  ? Number of children: Not on file  ? Years of education: Not on file  ? Highest education level: Not on file  ?Occupational History  ? Not on file  ?Tobacco Use  ? Smoking  status: Every Day  ?  Packs/day: 0.50  ?  Years: 35.00  ?  Pack years: 17.50  ?  Types: Cigarettes  ? Smokeless tobacco: Never  ? Tobacco comments:  ?  Currently being followed by the smoking cessation program   ?Vaping Use  ? Vaping Use: Never used  ?Substance and Sexual Activity  ? Alcohol use: Not Currently  ?  Comment: previous  ? Drug use: No  ? Sexual activity: Not on file  ?Other Topics Concern  ? Not on file  ?Social History Narrative  ? Not on file  ? ?Social Determinants of Health  ? ?Financial Resource Strain: High Risk  ? Difficulty of Paying Living Expenses: Very hard  ?Food Insecurity: Food Insecurity Present  ? Worried About Charity fundraiser in the Last Year: Sometimes true  ? Ran Out of Food in the Last Year: Sometimes true  ?Transportation Needs: Unmet Transportation Needs  ? Lack of Transportation (Medical): Yes  ? Lack of Transportation (Non-Medical): Yes  ?Physical Activity: Not on file  ?Stress: Not on file  ?Social Connections: Not on file  ?Intimate Partner Violence: Not on file  ? ? ?FAMILY HISTORY: ?Family History  ?Problem Relation Age of Onset  ?  Breast cancer Mother   ? Stroke Father   ? Alcohol abuse Father   ? Heart disease Maternal Grandfather   ? Pancreatic cancer Paternal Grandmother   ? Diabetes Paternal Grandfather   ? Colon cancer Neg Hx   ? Stomach cancer Neg Hx   ? Rectal cancer Neg Hx   ? Esophageal cancer Neg Hx   ? ? ?ALLERGIES:  is allergic to bee venom and lactose intolerance (gi). ? ?MEDICATIONS:  ?Current Outpatient Medications  ?Medication Sig Dispense Refill  ? Accu-Chek Softclix Lancets lancets Check blood glucose level by fingerstick 3-4 times per day. 100 each 3  ? albuterol (VENTOLIN HFA) 108 (90 Base) MCG/ACT inhaler Inhale 2 puffs into the lungs every 6 (six) hours as needed for wheezing or shortness of breath. 18 g 0  ? amoxicillin-clavulanate (AUGMENTIN) 875-125 MG tablet Take 1 tablet (875 mg total) by mouth every 12 (twelve) hours for 7 days 14 tablet 0  ?  atorvastatin (LIPITOR) 20 MG tablet Take 1 tablet (20 mg total) by mouth daily. 90 tablet 0  ? Blood Glucose Monitoring Suppl (ACCU-CHEK GUIDE) w/Device KIT Check blood glucose level by fingerstick 3-4 tim

## 2021-11-19 ENCOUNTER — Telehealth: Payer: Self-pay | Admitting: Hematology and Oncology
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
## 2021-11-19 NOTE — Telephone Encounter (Signed)
Scheduled per 3/21 los, pt has been called and confirmed ?

## 2021-11-20 ENCOUNTER — Other Ambulatory Visit: Payer: Self-pay | Admitting: *Deleted

## 2021-11-20 DIAGNOSIS — C250 Malignant neoplasm of head of pancreas: Secondary | ICD-10-CM

## 2021-11-24 ENCOUNTER — Other Ambulatory Visit: Payer: Self-pay

## 2021-11-24 ENCOUNTER — Telehealth: Payer: Self-pay | Admitting: *Deleted

## 2021-11-24 ENCOUNTER — Other Ambulatory Visit: Payer: Self-pay | Admitting: Physician Assistant

## 2021-11-24 ENCOUNTER — Other Ambulatory Visit: Payer: Self-pay | Admitting: Hematology and Oncology

## 2021-11-24 ENCOUNTER — Inpatient Hospital Stay (HOSPITAL_BASED_OUTPATIENT_CLINIC_OR_DEPARTMENT_OTHER): Payer: Medicaid Other | Admitting: Hematology and Oncology

## 2021-11-24 ENCOUNTER — Inpatient Hospital Stay: Payer: Medicaid Other

## 2021-11-24 ENCOUNTER — Other Ambulatory Visit (HOSPITAL_COMMUNITY): Payer: Self-pay

## 2021-11-24 ENCOUNTER — Other Ambulatory Visit: Payer: Self-pay | Admitting: Adult Health

## 2021-11-24 VITALS — BP 110/65 | HR 110 | Temp 97.1°F | Resp 18 | Wt 136.0 lb

## 2021-11-24 DIAGNOSIS — C250 Malignant neoplasm of head of pancreas: Secondary | ICD-10-CM

## 2021-11-24 DIAGNOSIS — Z95828 Presence of other vascular implants and grafts: Secondary | ICD-10-CM

## 2021-11-24 DIAGNOSIS — F17200 Nicotine dependence, unspecified, uncomplicated: Secondary | ICD-10-CM

## 2021-11-24 DIAGNOSIS — Z452 Encounter for adjustment and management of vascular access device: Secondary | ICD-10-CM | POA: Diagnosis not present

## 2021-11-24 LAB — CMP (CANCER CENTER ONLY)
ALT: 9 U/L (ref 0–44)
AST: 17 U/L (ref 15–41)
Albumin: 3.6 g/dL (ref 3.5–5.0)
Alkaline Phosphatase: 127 U/L — ABNORMAL HIGH (ref 38–126)
Anion gap: 8 (ref 5–15)
BUN: 9 mg/dL (ref 6–20)
CO2: 28 mmol/L (ref 22–32)
Calcium: 9.3 mg/dL (ref 8.9–10.3)
Chloride: 94 mmol/L — ABNORMAL LOW (ref 98–111)
Creatinine: 0.55 mg/dL — ABNORMAL LOW (ref 0.61–1.24)
GFR, Estimated: >60 mL/min (ref >60)
Glucose, Bld: 288 mg/dL — ABNORMAL HIGH (ref 70–99)
Potassium: 4.3 mmol/L (ref 3.5–5.1)
Sodium: 130 mmol/L — ABNORMAL LOW (ref 135–145)
Total Bilirubin: 0.4 mg/dL (ref 0.3–1.2)
Total Protein: 8.3 g/dL — ABNORMAL HIGH (ref 6.5–8.1)

## 2021-11-24 LAB — CBC WITH DIFFERENTIAL (CANCER CENTER ONLY)
Abs Immature Granulocytes: 0.08 10*3/uL — ABNORMAL HIGH (ref 0.00–0.07)
Basophils Absolute: 0.1 10*3/uL (ref 0.0–0.1)
Basophils Relative: 1 %
Eosinophils Absolute: 0.2 10*3/uL (ref 0.0–0.5)
Eosinophils Relative: 1 %
HCT: 34.8 % — ABNORMAL LOW (ref 39.0–52.0)
Hemoglobin: 11.7 g/dL — ABNORMAL LOW (ref 13.0–17.0)
Immature Granulocytes: 1 %
Lymphocytes Relative: 11 %
Lymphs Abs: 1.8 10*3/uL (ref 0.7–4.0)
MCH: 30.6 pg (ref 26.0–34.0)
MCHC: 33.6 g/dL (ref 30.0–36.0)
MCV: 91.1 fL (ref 80.0–100.0)
Monocytes Absolute: 2.6 10*3/uL — ABNORMAL HIGH (ref 0.1–1.0)
Monocytes Relative: 16 %
Neutro Abs: 11.5 10*3/uL — ABNORMAL HIGH (ref 1.7–7.7)
Neutrophils Relative %: 70 %
Platelet Count: 319 10*3/uL (ref 150–400)
RBC: 3.82 MIL/uL — ABNORMAL LOW (ref 4.22–5.81)
RDW: 13.4 % (ref 11.5–15.5)
WBC Count: 16.4 10*3/uL — ABNORMAL HIGH (ref 4.0–10.5)
nRBC: 0 % (ref 0.0–0.2)

## 2021-11-24 MED ORDER — HEPARIN SOD (PORK) LOCK FLUSH 100 UNIT/ML IV SOLN
500.0000 [IU] | Freq: Once | INTRAVENOUS | Status: AC
  Administered 2021-11-24: 500 [IU]

## 2021-11-24 MED ORDER — SODIUM CHLORIDE 0.9% FLUSH
10.0000 mL | Freq: Once | INTRAVENOUS | Status: AC
  Administered 2021-11-24: 10 mL

## 2021-11-24 MED ORDER — OXYCODONE HCL 5 MG PO TABS
10.0000 mg | ORAL_TABLET | Freq: Four times a day (QID) | ORAL | 0 refills | Status: DC | PRN
  Filled 2021-11-24: qty 120, 15d supply, fill #0

## 2021-11-24 NOTE — Progress Notes (Signed)
?[place] ?Telephone:([phone]) [phone]   [phone] ? ?PROGRESS NOTE ? ?Patient Care Team: ?[name], NP as PCP - General (Nurse Practitioner) ?Armbruster, Carlota Raspberry, MD as Consulting Physician (Gastroenterology) ?Orson Slick, MD as Consulting Physician (Oncology) ?Royston Bake, RN as Equities trader ? ?Hematological/Oncological History ?# Adenocarcinoma of the Pancreas, Stage IIB ?02/26/2021: patient seen by GI for jaundice and pancreatic mass causing CBD obstruction. CT abdomen showed pancreatic head mass (4.4 x 3.7 cm) and enlarged portocaval lymph nodes. ?02/27/2021: ERCP with placement of a nonmetallic biliary stent. FNA of pancreatic head showed malignant cells consistent with pancreatic adenocarcinoma. ?03/05/2021: CT Chest showed no evidence of metastatic disease in the chest. ?03/12/2021: establish care with Dr. Lorenso Courier ?04/10/2021: Cycle 1 Day 1 of FOLFIRINOX ?04/24/2021: Cycle 2 Day 1 of FOLFIRINOX ?05/22/2021: Cycle 3 Day 1 of FOLFIRINOX. Delayed x 2 weeks due to hospitalization for poor po intake and dehydration due to gastroparesis. ?06/05/2021: Cycle 4 Day 1 of FOLFIRINOX ?06/19/2021: Cycle 5 Day 1 of FOLFIRINOX ?07/02/2021: Cycle 6 Day 1 of FOLFIRINOX ?07/16/2021: Cycle 7 Day 1 of FOLFIRINOX  ?07/30/2021: Cycle 8 Day 1 of FOLFIRINOX  ?08/13/2021: Cycle 9 Day 1 of FOLFIRINOX  ?08/27/2021:  Cycle 10 Day 1 of FOLFIRINOX  ?09/10/2021: Cycle 11 Day 1 of FOLFIRINOX  ?09/24/2021: Cycle 12 Day 1 of FOLFIRINOX  ?10/28/2021: Underwent open whipple with portal vein reconstruction utilizing a R deep FV graft at [facility].  ? ?Interval History:  ?[name] [name] 61 y.o. male returns for a follow up visit for pancreatic cancer. He was last seen on 11/24/2021 by Dede Query. In the interim since his last visit he has continued on PO antibiotic therapy.  ? ?On exam today Mr. Brener notes that his mother unfortunately passed away.  He notes that he has been having some difficulty with stability  on his feet and did stumble and fall this morning.  He notes that he does feel chills on occasion but has not recorded a frank fever.  He notes that his appetite has been poor.  He notes that he has been having increased nausea with gagging on food.  He notes that the surgical pain is currently under control but he does occasionally have bouts of pain from the surgical site.  He continues to have some discharge from the wound from where the drain was located.  He otherwise denies any diarrhea, constipation, sweats, headache, or vision changes.  A full 10 point ROS is listed below. ? ?MEDICAL HISTORY:  ?Past Medical History:  ?Diagnosis Date  ? Barrett's esophagus dx 2016  ? Bronchitis   ? Chronic hepatitis C without hepatic coma ([place]) 10/31/2014  ? Depression   ? Diabetes ([place])   ? ED (erectile dysfunction)   ? GERD (gastroesophageal reflux disease)   ? Hepatitis C   ? Hiatal hernia 10/2014  ? 3cm  ? Neuropathy   ? S/P transmetatarsal amputation of foot, left ([place]) 11/28/2020  ? ? ?SURGICAL HISTORY: ?Past Surgical History:  ?Procedure Laterality Date  ? AMPUTATION Left 11/15/2020  ? Procedure: LEFT TRANSMETATARSALS AMPUTATION;  Surgeon: Newt Minion, MD;  Location: [facility];  Service: Orthopedics;  Laterality: Left;  ? APPENDECTOMY    ? BILIARY STENT PLACEMENT N/A 02/27/2021  ? Procedure: BILIARY STENT PLACEMENT;  Surgeon: Milus Banister, MD;  Location: [facility];  Service: Endoscopy;  Laterality: N/A;  ? BILIARY STENT PLACEMENT N/A 03/27/2021  ? Procedure: BILIARY STENT PLACEMENT;  Surgeon: Jackquline Denmark, MD;  Location: [facility];  Service: Endoscopy;  Laterality: N/A;  ? BIOPSY  03/27/2021  ? Procedure: BIOPSY;  Surgeon: Jackquline Denmark, MD;  Location: [facility];  Service: Endoscopy;;  ? COLONOSCOPY N/A 02/16/2014  ? Procedure: COLONOSCOPY;  Surgeon: Gatha Mayer, MD;  Location: [facility];  Service: Endoscopy;  Laterality: N/A;  ? COLONOSCOPY    ? ENDOSCOPIC RETROGRADE CHOLANGIOPANCREATOGRAPHY (ERCP) WITH  PROPOFOL N/A 02/27/2021  ? Procedure: ENDOSCOPIC RETROGRADE CHOLANGIOPANCREATOGRAPHY (ERCP) WITH PROPOFOL;  Surgeon: Milus Banister, MD;  Location: [facility];  Service: Endoscopy;  Laterality: N/A;  ? ERCP N/A 03/27/2021  ? Procedure: ENDOSCOPIC RETROGRADE CHOLANGIOPANCREATOGRAPHY (ERCP);  Surgeon: Jackquline Denmark, MD;  Location: [facility];  Service: Endoscopy;  Laterality: N/A;  ? ESOPHAGOGASTRODUODENOSCOPY (EGD) WITH PROPOFOL N/A 05/06/2021  ? Procedure: ESOPHAGOGASTRODUODENOSCOPY (EGD) WITH PROPOFOL;  Surgeon: Mauri Pole, MD;  Location: [facility];  Service: Endoscopy;  Laterality: N/A;  ? EUS N/A 02/27/2021  ? Procedure: UPPER ENDOSCOPIC ULTRASOUND (EUS) RADIAL;  Surgeon: Milus Banister, MD;  Location: [facility];  Service: Endoscopy;  Laterality: N/A;  ? FINE NEEDLE ASPIRATION N/A 02/27/2021  ? Procedure: FINE NEEDLE ASPIRATION (FNA) LINEAR;  Surgeon: Milus Banister, MD;  Location: [facility];  Service: Endoscopy;  Laterality: N/A;  ? IR IMAGING GUIDED PORT INSERTION  03/21/2021  ? SPHINCTEROTOMY  02/27/2021  ? Procedure: SPHINCTEROTOMY;  Surgeon: Milus Banister, MD;  Location: [facility];  Service: Endoscopy;;  ? STENT REMOVAL  03/27/2021  ? Procedure: STENT REMOVAL;  Surgeon: Jackquline Denmark, MD;  Location: [facility];  Service: Endoscopy;;  ? TONSILLECTOMY    ? ? ?SOCIAL HISTORY: ?Social History  ? ?Socioeconomic History  ? Marital status: Single  ?  Spouse name: Not on file  ? Number of children: Not on file  ? Years of education: Not on file  ? Highest education level: Not on file  ?Occupational History  ? Not on file  ?Tobacco Use  ? Smoking status: Every Day  ?  Packs/day: 0.50  ?  Years: 35.00  ?  Pack years: 17.50  ?  Types: Cigarettes  ? Smokeless tobacco: Never  ? Tobacco comments:  ?  Currently being followed by the smoking cessation program   ?Vaping Use  ? Vaping Use: Never used  ?Substance and Sexual Activity  ? Alcohol use: Not Currently  ?  Comment: previous  ? Drug use: No   ? Sexual activity: Not on file  ?Other Topics Concern  ? Not on file  ?Social History Narrative  ? Not on file  ? ?Social Determinants of Health  ? ?Financial Resource Strain: High Risk  ? Difficulty of Paying Living Expenses: Very hard  ?Food Insecurity: Food Insecurity Present  ? Worried About Charity fundraiser in the Last Year: Sometimes true  ? Ran Out of Food in the Last Year: Sometimes true  ?Transportation Needs: Unmet Transportation Needs  ? Lack of Transportation (Medical): Yes  ? Lack of Transportation (Non-Medical): Yes  ?Physical Activity: Not on file  ?Stress: Not on file  ?Social Connections: Not on file  ?Intimate Partner Violence: Not on file  ? ? ?FAMILY HISTORY: ?Family History  ?Problem Relation Age of Onset  ? Breast cancer Mother   ? Stroke Father   ? Alcohol abuse Father   ? Heart disease Maternal Grandfather   ? Pancreatic cancer Paternal Grandmother   ? Diabetes Paternal Grandfather   ? Colon cancer Neg Hx   ? Stomach cancer Neg Hx   ?  Rectal cancer Neg Hx   ? Esophageal cancer Neg Hx   ? ? ?ALLERGIES:  is allergic to bee venom and lactose intolerance (gi). ? ?MEDICATIONS:  ?Current Outpatient Medications  ?Medication Sig Dispense Refill  ? Accu-Chek Softclix Lancets lancets Check blood glucose level by fingerstick 3-4 times per day. 100 each 3  ? amoxicillin-clavulanate (AUGMENTIN) 875-125 MG tablet Take 1 tablet (875 mg total) by mouth every 12 (twelve) hours for 7 days 14 tablet 0  ? aspirin EC 81 MG tablet Take 81 mg by mouth daily. Swallow whole.    ? atorvastatin (LIPITOR) 20 MG tablet Take 1 tablet (20 mg total) by mouth daily. 90 tablet 0  ? Blood Glucose Monitoring Suppl (ACCU-CHEK GUIDE) w/Device KIT Check blood glucose level by fingerstick 3-4 times per day. 1 kit 0  ? chlorhexidine (PERIDEX) 0.12 % solution Rinse between teeth using irrigation syringe (15 mL) 2-3 times a day or as needed for gum irritation. 473 mL 2  ? diphenoxylate-atropine (LOMOTIL) 2.5-0.025 MG tablet Take 1  tablet by mouth 4 (four) times daily as needed for diarrhea or loose stools. 60 tablet 1  ? doxycycline (MONODOX) 100 MG capsule Take by mouth.    ? DULoxetine (CYMBALTA) 60 MG capsule Take 1 capsule (

## 2021-11-24 NOTE — Telephone Encounter (Signed)
Received call from pt requesting refill of his oxycodone. Advised that Dr. Lorenso Courier is taking care of that as we speak.   Pt also asking when he should go to ED. As reviewed by Dr. Lorenso Courier earlier today when pt was here, pt should go to ED with temp >100.4. Pt currently has an infected wound s/p abd surgery in February (10/28/21)   Pt states he has had low grade temp for several days, >1 week  The wound is draining purulent drainage. Pt has to change the absorbant dressing 3 x a day.  He is on antibiotics-Augmentin but the wound had not been cultured when he was seen by the surgeon last Friday. Pt states he has a telephone visit  with surgeon on Friday 11/28/21  ? ?Advised that if he feels worse than he does now, spikes a higher temp, then he should go to the ED immediately.  Advised that I will call him on Wednesday to see how his doing but he can call at any time with questions /concerns. Pt voiced understanding. ? ?Dr. Lorenso Courier aware of the above. ?

## 2021-11-25 ENCOUNTER — Encounter (HOSPITAL_BASED_OUTPATIENT_CLINIC_OR_DEPARTMENT_OTHER): Payer: Medicaid Other | Admitting: General Surgery

## 2021-11-25 ENCOUNTER — Emergency Department (HOSPITAL_COMMUNITY)
Admission: EM | Admit: 2021-11-25 | Discharge: 2021-11-25 | Disposition: A | Discharge status: Home / Self Care | Payer: Medicaid Other | Attending: Emergency Medicine | Admitting: Emergency Medicine

## 2021-11-25 ENCOUNTER — Telehealth: Payer: Self-pay | Admitting: *Deleted

## 2021-11-25 ENCOUNTER — Other Ambulatory Visit (HOSPITAL_COMMUNITY): Payer: Self-pay

## 2021-11-25 ENCOUNTER — Encounter (HOSPITAL_COMMUNITY): Payer: Self-pay

## 2021-11-25 ENCOUNTER — Encounter: Payer: Self-pay | Admitting: Hematology and Oncology

## 2021-11-25 ENCOUNTER — Other Ambulatory Visit: Payer: Self-pay

## 2021-11-25 ENCOUNTER — Other Ambulatory Visit: Payer: Self-pay | Admitting: Hematology and Oncology

## 2021-11-25 DIAGNOSIS — W19XXXD Unspecified fall, subsequent encounter: Secondary | ICD-10-CM | POA: Diagnosis not present

## 2021-11-25 DIAGNOSIS — Z4802 Encounter for removal of sutures: Secondary | ICD-10-CM | POA: Insufficient documentation

## 2021-11-25 DIAGNOSIS — Z7982 Long term (current) use of aspirin: Secondary | ICD-10-CM | POA: Insufficient documentation

## 2021-11-25 DIAGNOSIS — E11621 Type 2 diabetes mellitus with foot ulcer: Secondary | ICD-10-CM | POA: Diagnosis not present

## 2021-11-25 DIAGNOSIS — S0181XD Laceration without foreign body of other part of head, subsequent encounter: Secondary | ICD-10-CM | POA: Diagnosis not present

## 2021-11-25 LAB — CANCER ANTIGEN 19-9: CA 19-9: 21 U/mL (ref 0–35)

## 2021-11-25 MED ORDER — OXYCODONE HCL 5 MG PO TABS
5.0000 mg | ORAL_TABLET | Freq: Four times a day (QID) | ORAL | 0 refills | Status: DC | PRN
  Filled 2021-11-25: qty 120, 30d supply, fill #0

## 2021-11-25 MED FILL — Albuterol Sulfate Inhal Aero 108 MCG/ACT (90MCG Base Equiv): RESPIRATORY_TRACT | 25 days supply | Qty: 18 | Fill #0 | Status: AC

## 2021-11-25 NOTE — Telephone Encounter (Signed)
Patient called about his oxycodone refill.  States that Dr Lorenso Courier was going to reorder to correct pharmacy [facility] but Epic/mychart shows it was discontinued. ? ?Routing to provider to refill if that is what he plans to do. ?
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
## 2021-11-25 NOTE — ED Triage Notes (Signed)
Patient states he needs stitches removed on the forehead. ?

## 2021-11-25 NOTE — ED Provider Notes (Signed)
?[facility] DEPT ?Provider Note ? ? ?CSN: [id number] ?Arrival date & time: 11/25/21  1145 ? ?  ? ?History ? ?Chief Complaint  ?Patient presents with  ? Suture / Staple Removal  ? ? ?[name] [name] is a 61 y.o. male who presents emergency department for suture removal.  Patient had 3 sutures placed in his left forehead on 3/21 after fall.  States he has been doing well. ? ? ?Suture / Staple Removal ? ? ?  ? ?Home Medications ?Prior to Admission medications   ?Medication Sig Start Date End Date Taking? Authorizing Provider  ?Accu-Chek Softclix Lancets lancets Check blood glucose level by fingerstick 3-4 times per day. 07/04/21   Charlott Rakes, MD  ?amoxicillin-clavulanate (AUGMENTIN) 875-125 MG tablet Take 1 tablet (875 mg total) by mouth every 12 (twelve) hours for 7 days 11/14/21     ?aspirin EC 81 MG tablet Take 81 mg by mouth daily. Swallow whole.    [provider]  ?atorvastatin (LIPITOR) 20 MG tablet Take 1 tablet (20 mg total) by mouth daily. 10/02/21 12/31/21  [name], NP  ?Blood Glucose Monitoring Suppl (ACCU-CHEK GUIDE) w/Device KIT Check blood glucose level by fingerstick 3-4 times per day. 07/04/21   Charlott Rakes, MD  ?chlorhexidine (PERIDEX) 0.12 % solution Rinse between teeth using irrigation syringe (15 mL) 2-3 times a day or as needed for gum irritation. 09/12/21   Sandi Mariscal B, DMD  ?diphenoxylate-atropine (LOMOTIL) 2.5-0.025 MG tablet Take 1 tablet by mouth 4 (four) times daily as needed for diarrhea or loose stools. 10/08/21   [name], NP  ?doxycycline (MONODOX) 100 MG capsule Take by mouth. 05/20/20   [provider]  ?DULoxetine (CYMBALTA) 60 MG capsule Take 1 capsule (60 mg total) by mouth daily. 09/10/21 12/09/21  [name], NP  ?enoxaparin (LOVENOX) 40 MG/0.4ML injection Inject 40 mg into the skin daily.  11/26/21  [provider]  ?EPINEPHrine 0.3 mg/0.3 mL IJ SOAJ injection Inject 0.3 mg into the muscle once as  needed for anaphylaxis. 06/11/20   [provider]  ?folic acid (FOLVITE) 1 MG tablet Take 1 tablet (1 mg total) by mouth in the morning. 03/28/21   Rai, Ripudeep K, MD  ?glucose blood (ACCU-CHEK GUIDE) test strip Check blood glucose level by fingerstick 3 - 4 times per day. 07/04/21   Charlott Rakes, MD  ?Insulin NPH, Human,, Isophane, (HUMULIN N KWIKPEN) 100 UNIT/ML Kiwkpen Inject 35 Units into the skin every morning. 09/03/21   Renato Shin, MD  ?Insulin Pen Needle (TECHLITE PEN NEEDLES) 32G X 4 MM MISC USE AS INSTRUCTED INTO THE SKIN TWICE DAILY 03/04/21   [name], NP  ?Insulin Pen Needle (TRUEPLUS PEN NEEDLES) 32G X 4 MM MISC Use as instructed. Inject into the skin twice daily 03/04/21   [name], NP  ?Insulin Syringe-Needle U-100 (TRUEPLUS INSULIN SYRINGE) 31G X 5/16" 0.3 ML MISC Use to inject insulin once daily. 07/31/21   Charlott Rakes, MD  ?lidocaine-prilocaine (EMLA) cream Apply 1 application topically as directed as needed. 08/07/21   Lincoln Brigham, PA-C  ?lipase/protease/amylase (CREON) 36000 UNITS CPEP capsule Take 2 capsules (72,000 Units total) by mouth 3 (three) times daily with meals. May also take 1 capsule (36,000 Units total) as needed (with snacks). 04/07/21   [name], NP  ?losartan (COZAAR) 50 MG tablet Take 1 tablet (50 mg total) by mouth daily. 05/19/21 11/10/21  [name], NP  ?metoCLOPramide (REGLAN) 5 MG tablet  Take 1 tablet (5 mg total) by mouth 4 (four) times daily -  before meals and at bedtime. 05/08/21 07/04/21  Hosie Poisson, MD  ?mirtazapine (REMERON) 30 MG tablet Take 1 tablet (30 mg total) by mouth at bedtime. 07/04/21 02/05/22  Charlott Rakes, MD  ?Multiple Vitamins-Minerals (THEREMS-M) TABS Take 1 tablet by mouth in the morning. 07/04/21   [name], NP  ?nicotine (NICODERM CQ - DOSED IN MG/24 HOURS) 14 mg/24hr patch Apply one patch daily x 12 weeks. 10/08/21     ?ondansetron (ZOFRAN) 8 MG tablet Take 1 tablet (8 mg total) by mouth 2 (two) times  daily as needed. Start on day 3 after chemotherapy. 08/07/21   Lincoln Brigham, PA-C  ?oxyCODONE (OXY IR/ROXICODONE) 5 MG immediate release tablet Take 1 tablet (5 mg total) by mouth every 6 (six) hours as needed for severe pain. 11/25/21   Orson Slick, MD  ?pantoprazole (PROTONIX) 40 MG tablet Take 1 tablet (40 mg total) by mouth daily. 07/08/21 01/05/22  [name], NP  ?polyethylene glycol powder (GLYCOLAX/MIRALAX) 17 GM/SCOOP powder Take 17 g by mouth at bedtime. 05/08/21   Hosie Poisson, MD  ?prochlorperazine (COMPAZINE) 10 MG tablet Take 1 tablet (10 mg total) by mouth every 6 (six) hours as needed for nausea or vomiting. 08/07/21   Lincoln Brigham, PA-C  ?Syringe, Disposable, 3 ML MISC Use As Directed 07/31/21   Renato Shin, MD  ?varenicline (APO-VARENICLINE) 1 MG tablet Take 1 tablet by mouth once daily in the morning with food 08/26/21     ?albuterol (VENTOLIN HFA) 108 (90 Base) MCG/ACT inhaler Inhale 2 puffs into the lungs every 6 (six) hours as needed for wheezing or shortness of breath. 11/25/21   Orson Slick, MD  ?   ? ?Allergies    ?Bee venom and Lactose intolerance (gi)   ? ?Review of Systems   ?Review of Systems  ?Skin:  Positive for wound.  ?All other systems reviewed and are negative. ? ?Physical Exam ?Updated Vital Signs ?BP 127/81 (BP Location: Left Arm)   Pulse (!) 109   Temp 98.4 ?F (36.9 ?C) (Oral)   Resp 18   Ht 5' 11"  (1.803 m)   Wt 59.4 kg   SpO2 99%   BMI 18.27 kg/m?  ?Physical Exam ?Vitals and nursing note reviewed.  ?Constitutional:   ?   Appearance: Normal appearance.  ?HENT:  ?   Head: Normocephalic and atraumatic.  ?   Comments: Partially healed linear laceration approximately 4 cm over the left forehead with 3 sutures ?Eyes:  ?   Conjunctiva/sclera: Conjunctivae normal.  ?Pulmonary:  ?   Effort: Pulmonary effort is normal. No respiratory distress.  ?Skin: ?   General: Skin is warm and dry.  ?Neurological:  ?   Mental Status: He is alert.  ?Psychiatric:     ?   Mood  and Affect: Mood normal.     ?   Behavior: Behavior normal.  ? ? ?ED Results / Procedures / Treatments   ?Labs ?(all labs ordered are listed, but only abnormal results are displayed) ?Labs Reviewed - No data to display ? ?EKG ?None ? ?Radiology ?No results found. ? ?Procedures ?Marland KitchenSuture Removal ? ?Date/Time: 11/25/2021 1:11 PM ?Performed by: Kateri Plummer, PA-C ?Authorized by: Kateri Plummer, PA-C  ? ?Consent:  ?  Consent obtained:  Verbal ?  Consent given by:  Patient ?Universal protocol:  ?  Procedure explained and questions answered to patient or proxy's  satisfaction: yes   ?  Patient identity confirmed:  Verbally with patient ?Location:  ?  Location:  Head/neck ?  Head/neck location:  Forehead ?Procedure details:  ?  Wound appearance:  Good wound healing, nonpurulent, clean and no signs of infection ?  Number of sutures removed:  3 ?Post-procedure details:  ?  Post-removal:  No dressing applied ?  Procedure completion:  Tolerated well, no immediate complications  ? ? ?Medications Ordered in ED ?Medications - No data to display ? ?ED Course/ Medical Decision Making/ A&P ?  ?                        ?Medical Decision Making ? ?Pt to ER for suture removal and wound check as above. Procedure tolerated well. Vitals normal, no signs of infection. Scar minimization & return precautions given at dc.  ? ?Final Clinical Impression(s) / ED Diagnoses ?Final diagnoses:  ?Visit for suture removal  ? ? ?Rx / DC Orders ?ED Discharge Orders   ? ? None  ? ?  ? ?Portions of this report may have been transcribed using voice recognition software. Every effort was made to ensure accuracy; however, inadvertent computerized transcription errors may be present. ? ?  ?Gregg Winchell T, PA-C ?11/25/21 1311 ? ?  ?Pattricia [name], MD ?11/26/21 1720 ? ?
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
## 2021-11-25 NOTE — Discharge Instructions (Signed)
You were seen in the emergency department for suture removal. ? ?Continue to monitor the area for signs of infection. You can clean it with warm water and gentle soap.  ?

## 2021-11-26 ENCOUNTER — Ambulatory Visit
Admission: RE | Admit: 2021-11-26 | Discharge: 2021-11-26 | Disposition: A | Discharge status: Home / Self Care | Payer: Self-pay | Source: Ambulatory Visit | Attending: Radiation Oncology | Admitting: Radiation Oncology

## 2021-11-26 ENCOUNTER — Other Ambulatory Visit: Payer: Self-pay | Admitting: Radiation Oncology

## 2021-11-26 DIAGNOSIS — C250 Malignant neoplasm of head of pancreas: Secondary | ICD-10-CM

## 2021-11-28 ENCOUNTER — Telehealth: Payer: Self-pay | Admitting: *Deleted

## 2021-11-28 ENCOUNTER — Other Ambulatory Visit: Payer: Self-pay | Admitting: *Deleted

## 2021-11-28 DIAGNOSIS — C250 Malignant neoplasm of head of pancreas: Secondary | ICD-10-CM

## 2021-11-28 NOTE — Telephone Encounter (Signed)
TCT patient to check on him s/p surgery and visit here on 11/24/21 ?Spoke with him. He states his abdominal wound is still draining purulent exudate and he has to change his dressing 2-3 x a times a day. He also states he has low grade temps 99-100 daily as well  Pt had video visit with surgeon from [facility] earlier today. No new recommendations from his surgeon.  ?Advised we will recheck his labs in 2 weeks but to call back with any changes, concerns or questions. Pt voiced understanding.  ?Scheduling message sent for lab appt in 2 weeks ?

## 2021-11-30 ENCOUNTER — Other Ambulatory Visit: Payer: Self-pay | Admitting: Nurse Practitioner

## 2021-11-30 DIAGNOSIS — K2210 Ulcer of esophagus without bleeding: Secondary | ICD-10-CM

## 2021-11-30 DIAGNOSIS — K3184 Gastroparesis: Secondary | ICD-10-CM

## 2021-11-30 DIAGNOSIS — K219 Gastro-esophageal reflux disease without esophagitis: Secondary | ICD-10-CM

## 2021-12-01 ENCOUNTER — Encounter: Payer: Self-pay | Admitting: Hematology and Oncology

## 2021-12-01 ENCOUNTER — Other Ambulatory Visit (HOSPITAL_COMMUNITY): Payer: Self-pay

## 2021-12-01 ENCOUNTER — Other Ambulatory Visit: Payer: Self-pay | Admitting: Nurse Practitioner

## 2021-12-01 NOTE — Progress Notes (Signed)
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 ? ? ?Pathology Report 10/28/2021 ? ? ? ?Past/Anticipated interventions by surgeon, if any:  ?Dr. Hyman Hopes 11/28/2021 ?-Spoke by phone. He is doing well. No vomiting at all, eating fairly well.  ?-He is still having a little of clear drainage from his midline incision, although it is totally intact. Still leaking a little bit from drain site.  ?-[facility] 10/28/2021 at [facility] ? ?-He reports the drainage (white pus) from his incision site.  He reports he will be seen on 4/6 by he surgeon. ? ?Past/Anticipated interventions by medical oncology, if any:  ?Dr. Lorenso Courier 11/24/2021 ?-Neoadjuvant chemotherapy completed 09/24/2021- FOLFIRINOX ? ? ? ?Weight changes, if any: He has lost about 5 pounds within the last month.  He has lost about 60-70 pounds within the last year. ? ?Bowel/Bladder complaints, if any: He reports increased diarrhea.   ? ?Nausea / Vomiting, if any: No ? ?Pain issues, if any:  Abdominal pain, lower bowel and upper abdomen towards diaphragm. ? ?Left Transmetatarsal Foot Amputation- He is seen by infection/wound care.   ? ?SAFETY ISSUES: ?Prior radiation? No ?Pacemaker/ICD? No ?Possible current pregnancy? N/a ?Is the patient on methotrexate? no ? ?Current Complaints/Details: ?Port Insertion  ?

## 2021-12-01 NOTE — Progress Notes (Signed)
?Radiation Oncology         [phone] ?________________________________ ? ?Name: [name] [name]        MRN: [id number]  ?Date of Service: 12/02/2021 DOB: 13-Mar-1961 ? ?[id number], Vernia Buff, NP  Orson Slick, MD    ? ?REFERRING PHYSICIAN: Orson Slick, MD ? ? ?DIAGNOSIS: The encounter diagnosis was Malignant neoplasm of head of pancreas ([place]). ? ? ?HISTORY OF PRESENT ILLNESS: [name] [name] is a 61 y.o. male seen at the request of Dr. Irene Limbo for a diagnosis of pancreatic cancer.  The patient was originally diagnosed in the summer 2022 with borderline resectable disease.  He received 12 cycles of FOLFIRINOX between April 10, 2021 and September 24, 2021.  He underwent Whipple procedure with portal vein reconstruction with our deep FV graft on 10/28/2021.  All of the 34 lymph nodes that were removed were negative, the SMV margin was positive, as was the pancreatic neck and uncinate margins.  A portion of the SMV resection within the wall of the vein was involved by adenocarcinoma in 1 unoriented end of the vessel wall contained adenocarcinoma but the remainder was negative.  Given the close margins he is seen to discuss adjuvant radiotherapy with chemosensitization. ? ? ? ?PREVIOUS RADIATION THERAPY: No ? ? ?PAST MEDICAL HISTORY:  ?Past Medical History:  ?Diagnosis Date  ? Barrett's esophagus dx 2016  ? Bronchitis   ? Chronic hepatitis C without hepatic coma ([place]) 10/31/2014  ? Depression   ? Diabetes ([place])   ? ED (erectile dysfunction)   ? GERD (gastroesophageal reflux disease)   ? Hepatitis C   ? Hiatal hernia 10/2014  ? 3cm  ? Neuropathy   ? S/P transmetatarsal amputation of foot, left ([place]) 11/28/2020  ?   ? ? ?PAST SURGICAL HISTORY: ?Past Surgical History:  ?Procedure Laterality Date  ? AMPUTATION Left 11/15/2020  ? Procedure: LEFT TRANSMETATARSALS AMPUTATION;  Surgeon: Newt Minion, MD;  Location: [facility];  Service: Orthopedics;  Laterality: Left;  ? APPENDECTOMY    ? BILIARY STENT PLACEMENT N/A 02/27/2021  ?  Procedure: BILIARY STENT PLACEMENT;  Surgeon: Milus Banister, MD;  Location: [facility];  Service: Endoscopy;  Laterality: N/A;  ? BILIARY STENT PLACEMENT N/A 03/27/2021  ? Procedure: BILIARY STENT PLACEMENT;  Surgeon: Jackquline Denmark, MD;  [facility];  Service: Endoscopy;  Laterality: N/A;  ? BIOPSY  03/27/2021  ? Procedure: BIOPSY;  Surgeon: Jackquline Denmark, MD;  Location: [facility];  Service: Endoscopy;;  ? COLONOSCOPY N/A 02/16/2014  ? Procedure: COLONOSCOPY;  Surgeon: Gatha Mayer, MD;  [facility];  Service: Endoscopy;  Laterality: N/A;  ? COLONOSCOPY    ? ENDOSCOPIC RETROGRADE CHOLANGIOPANCREATOGRAPHY (ERCP) WITH PROPOFOL N/A 02/27/2021  ? Procedure: ENDOSCOPIC RETROGRADE CHOLANGIOPANCREATOGRAPHY (ERCP) WITH PROPOFOL;  Surgeon: Milus Banister, MD;  [facility];  Service: Endoscopy;  Laterality: N/A;  ? ERCP N/A 03/27/2021  ? Procedure: ENDOSCOPIC RETROGRADE CHOLANGIOPANCREATOGRAPHY (ERCP);  Surgeon: Jackquline Denmark, MD;  Location: [facility];  Service: Endoscopy;  Laterality: N/A;  ? ESOPHAGOGASTRODUODENOSCOPY (EGD) WITH PROPOFOL N/A 05/06/2021  ? Procedure: ESOPHAGOGASTRODUODENOSCOPY (EGD) WITH PROPOFOL;  Surgeon: Mauri Pole, MD;  [facility];  Service: Endoscopy;  Laterality: N/A;  ? EUS N/A 02/27/2021  ? Procedure: UPPER ENDOSCOPIC ULTRASOUND (EUS) RADIAL;  Surgeon: Milus Banister, MD;  [facility];  Service: Endoscopy;  Laterality: N/A;  ? FINE NEEDLE ASPIRATION N/A 02/27/2021  ? Procedure: FINE NEEDLE ASPIRATION (FNA) LINEAR;  Surgeon: Milus Banister,  MD;  [facility];  Service: Endoscopy;  Laterality: N/A;  ? IR IMAGING GUIDED PORT INSERTION  03/21/2021  ? SPHINCTEROTOMY  02/27/2021  ? Procedure: SPHINCTEROTOMY;  Surgeon: Milus Banister, MD;  Location: [facility];  Service: Endoscopy;;  ? STENT REMOVAL  03/27/2021  ? Procedure: STENT REMOVAL;  Surgeon: Jackquline Denmark, MD;  Location: [facility];  Service: Endoscopy;;  ?  TONSILLECTOMY    ? ? ? ?FAMILY HISTORY:  ?Family History  ?Problem Relation Age of Onset  ? Breast cancer Mother   ? Stroke Father   ? Alcohol abuse Father   ? Heart disease Maternal Grandfather   ? Pancreatic cancer Paternal Grandmother   ? Diabetes Paternal Grandfather   ? Colon cancer Neg Hx   ? Stomach cancer Neg Hx   ? Rectal cancer Neg Hx   ? Esophageal cancer Neg Hx   ? ? ? ?SOCIAL HISTORY:  reports that he has been smoking cigarettes. He has a 17.50 pack-year smoking history. He has never used smokeless tobacco. He reports that he does not currently use alcohol. He reports that he does not use drugs.  The patient is single and lives in [place].  He is originally from [place].  Prior to his diagnosis his he was working full-time as a Programmer, systems. ? ? ?ALLERGIES: Bee venom and Lactose intolerance (gi) ? ? ?MEDICATIONS:  ?Current Outpatient Medications  ?Medication Sig Dispense Refill  ? Accu-Chek Softclix Lancets lancets Check blood glucose level by fingerstick 3-4 times per day. 100 each 3  ? albuterol (VENTOLIN HFA) 108 (90 Base) MCG/ACT inhaler Inhale 2 puffs into the lungs every 6 (six) hours as needed for wheezing or shortness of breath. 18 g 0  ? amoxicillin-clavulanate (AUGMENTIN) 875-125 MG tablet Take 1 tablet by mouth every 12  hours for 7 days 14 tablet 0  ? aspirin EC 81 MG tablet Take 81 mg by mouth daily. Swallow whole.    ? atorvastatin (LIPITOR) 20 MG tablet Take 1 tablet (20 mg total) by mouth daily. 90 tablet 0  ? Blood Glucose Monitoring Suppl (ACCU-CHEK GUIDE) w/Device KIT Check blood glucose level by fingerstick 3-4 times per day. 1 kit 0  ? chlorhexidine (PERIDEX) 0.12 % solution Rinse between teeth using irrigation syringe (15 mL) 2-3 times a day or as needed for gum irritation. 473 mL 2  ? diphenoxylate-atropine (LOMOTIL) 2.5-0.025 MG tablet Take 1 tablet by mouth 4 (four) times daily as needed for diarrhea or loose stools. 60 tablet 1  ? doxycycline (MONODOX) 100 MG  capsule Take by mouth.    ? DULoxetine (CYMBALTA) 60 MG capsule Take 1 capsule (60 mg total) by mouth daily. 90 capsule 0  ? EPINEPHrine 0.3 mg/0.3 mL IJ SOAJ injection Inject 0.3 mg into the muscle once as needed for anaphylaxis.    ? folic acid (FOLVITE) 1 MG tablet Take 1 tablet (1 mg total) by mouth in the morning.    ? glucose blood (ACCU-CHEK GUIDE) test strip Check blood glucose level by fingerstick 3 - 4 times per day. 100 each 12  ? Insulin NPH, Human,, Isophane, (HUMULIN N KWIKPEN) 100 UNIT/ML Kiwkpen Inject 35 Units into the skin every morning. 45 mL 3  ? Insulin Pen Needle (TECHLITE PEN NEEDLES) 32G X 4 MM MISC USE AS INSTRUCTED INTO THE SKIN TWICE DAILY 100 each 11  ? Insulin Pen Needle (TRUEPLUS PEN NEEDLES) 32G X 4 MM MISC Use as instructed. Inject into the skin twice daily 200 each 6  ?  Insulin Pen Needle 32G X 4 MM MISC Use as directed with humulin kwikpen. 100 each 3  ? Insulin Syringe-Needle U-100 (TRUEPLUS INSULIN SYRINGE) 31G X 5/16" 0.3 ML MISC Use to inject insulin once daily. 100 each 6  ? lidocaine-prilocaine (EMLA) cream Apply 1 application topically as directed as needed. 30 g 0  ? lipase/protease/amylase (CREON) 36000 UNITS CPEP capsule Take 2 capsules (72,000 Units total) by mouth 3 (three) times daily with meals. May also take 1 capsule (36,000 Units total) as needed (with snacks). 210 capsule 3  ? losartan (COZAAR) 50 MG tablet Take 1 tablet (50 mg total) by mouth daily. 90 tablet 1  ? mirtazapine (REMERON) 30 MG tablet Take 1 tablet (30 mg total) by mouth at bedtime. 90 tablet 1  ? Multiple Vitamins-Minerals (THEREMS-M) TABS Take 1 tablet by mouth in the morning. 90 tablet 3  ? nicotine (NICODERM CQ - DOSED IN MG/24 HOURS) 14 mg/24hr patch Apply one patch daily x 12 weeks. 84 patch 1  ? ondansetron (ZOFRAN) 8 MG tablet Take 1 tablet (8 mg total) by mouth 2 (two) times daily as needed. Start on day 3 after chemotherapy. 30 tablet 1  ? oxyCODONE (OXY IR/ROXICODONE) 5 MG immediate  release tablet Take 1 tablet (5 mg total) by mouth every 6 (six) hours as needed for severe pain. 120 tablet 0  ? pantoprazole (PROTONIX) 40 MG tablet Take 1 tablet (40 mg total) by mouth daily. 90 tablet 0  ? poly

## 2021-12-02 ENCOUNTER — Telehealth: Payer: Self-pay

## 2021-12-02 ENCOUNTER — Other Ambulatory Visit: Payer: Self-pay

## 2021-12-02 ENCOUNTER — Ambulatory Visit
Admission: RE | Admit: 2021-12-02 | Discharge: 2021-12-02 | Disposition: A | Discharge status: Home / Self Care | Payer: Medicaid Other | Source: Ambulatory Visit | Attending: Radiation Oncology | Admitting: Radiation Oncology

## 2021-12-02 ENCOUNTER — Ambulatory Visit: Payer: Medicaid Other | Admitting: Radiation Oncology

## 2021-12-02 ENCOUNTER — Encounter (HOSPITAL_BASED_OUTPATIENT_CLINIC_OR_DEPARTMENT_OTHER): Payer: Medicaid Other | Admitting: General Surgery

## 2021-12-02 ENCOUNTER — Other Ambulatory Visit (HOSPITAL_COMMUNITY): Payer: Self-pay

## 2021-12-02 ENCOUNTER — Encounter: Payer: Self-pay | Admitting: Radiation Oncology

## 2021-12-02 ENCOUNTER — Ambulatory Visit: Payer: Medicaid Other

## 2021-12-02 VITALS — Ht 71.0 in | Wt 132.0 lb

## 2021-12-02 DIAGNOSIS — C250 Malignant neoplasm of head of pancreas: Secondary | ICD-10-CM

## 2021-12-02 MED ORDER — INSULIN PEN NEEDLE 32G X 4 MM MISC
3 refills | Status: DC
  Filled 2021-12-02: qty 100, 90d supply, fill #0

## 2021-12-02 MED ORDER — ASPIRIN 81 MG PO TBEC
81.0000 mg | DELAYED_RELEASE_TABLET | Freq: Every day | ORAL | 0 refills | Status: DC
  Filled 2021-12-02: qty 100, 100d supply, fill #0

## 2021-12-02 MED ORDER — PANTOPRAZOLE SODIUM 40 MG PO TBEC
40.0000 mg | DELAYED_RELEASE_TABLET | Freq: Every day | ORAL | 0 refills | Status: DC
  Filled 2021-12-02 – 2022-02-06 (×3): qty 90, 90d supply, fill #0

## 2021-12-02 MED ORDER — AMOXICILLIN-POT CLAVULANATE 875-125 MG PO TABS
ORAL_TABLET | ORAL | 0 refills | Status: DC
  Filled 2021-12-02: qty 14, 7d supply, fill #0

## 2021-12-02 NOTE — Telephone Encounter (Signed)
Called pt to let him know that Dr. Lorenso Courier is aware of his call regarding drainage from surgical site. Patient shared that he will see surgeon on 12/04/21 and that they may potentially place a drain. Pt will call the office after he sees surgeon to update the office on surgeon's plan to address drainage. ?

## 2021-12-02 NOTE — Telephone Encounter (Signed)
Requested Prescriptions  ?Pending Prescriptions Disp Refills  ?? pantoprazole (PROTONIX) 40 MG tablet 90 tablet 1  ?  Sig: Take 1 tablet (40 mg total) by mouth daily.  ?  ? Gastroenterology: Proton Pump Inhibitors Passed - 11/30/2021 11:44 AM  ?  ?  Passed - Valid encounter within last 12 months  ?  Recent Outpatient Visits   ?      ? 1 month ago Loose stools  ? [place], [place] W, NP  ? 4 months ago GERD without esophagitis  ? [place], [place] W, NP  ? 5 months ago Dislocation of finger, subsequent encounter  ? [place] [name], NP  ? 7 months ago Hospital discharge follow-up  ? [place], [place] W, NP  ? 10 months ago Loose stools  ? [place] [name], NP  ?  ?  ?Future Appointments   ?        ? Today Kyung Rudd, MD [facility] Radiation Oncology  ? In 1 week [name], NP [place]  ?  ? ?  ?  ?  ? ? ?

## 2021-12-03 ENCOUNTER — Other Ambulatory Visit (HOSPITAL_COMMUNITY): Payer: Self-pay

## 2021-12-05 ENCOUNTER — Telehealth: Payer: Self-pay | Admitting: *Deleted

## 2021-12-05 ENCOUNTER — Other Ambulatory Visit (HOSPITAL_COMMUNITY): Payer: Self-pay

## 2021-12-05 MED ORDER — AMOXICILLIN-POT CLAVULANATE 875-125 MG PO TABS
ORAL_TABLET | ORAL | 0 refills | Status: DC
  Filled 2021-12-05: qty 14, 7d supply, fill #0

## 2021-12-05 MED ORDER — TYLENOL 325 MG PO CAPS: ORAL_CAPSULE | ORAL | 0 refills | Status: DC

## 2021-12-05 NOTE — Telephone Encounter (Signed)
TCT patient to see how he is doing post surgery. ?Spoke with pt. He states he was at [facility] overnight for drainage  of intra-abdominal abscess. He states he feels ok.no drains in place at this time. No drainage noted. Denies fever/chills. Reminded pt that he needs to go to ED. Continues to be on antibiotics. He is aware of appts for next week. He is aware that [facility] will be in touch with him soon for his f/u radiation treatments ?

## 2021-12-06 ENCOUNTER — Other Ambulatory Visit (HOSPITAL_COMMUNITY): Payer: Self-pay

## 2021-12-08 ENCOUNTER — Other Ambulatory Visit: Payer: Self-pay | Admitting: Hematology and Oncology

## 2021-12-08 ENCOUNTER — Inpatient Hospital Stay: Payer: Medicaid Other | Attending: Hematology and Oncology

## 2021-12-08 ENCOUNTER — Telehealth: Payer: Self-pay | Admitting: *Deleted

## 2021-12-08 ENCOUNTER — Other Ambulatory Visit (HOSPITAL_COMMUNITY): Payer: Self-pay

## 2021-12-08 ENCOUNTER — Other Ambulatory Visit: Payer: Self-pay

## 2021-12-08 ENCOUNTER — Telehealth: Payer: Self-pay | Admitting: Radiation Oncology

## 2021-12-08 ENCOUNTER — Encounter (HOSPITAL_BASED_OUTPATIENT_CLINIC_OR_DEPARTMENT_OTHER): Payer: Medicaid Other | Attending: General Surgery | Admitting: General Surgery

## 2021-12-08 DIAGNOSIS — Z8619 Personal history of other infectious and parasitic diseases: Secondary | ICD-10-CM | POA: Insufficient documentation

## 2021-12-08 DIAGNOSIS — K2270 Barrett's esophagus without dysplasia: Secondary | ICD-10-CM | POA: Insufficient documentation

## 2021-12-08 DIAGNOSIS — Z8507 Personal history of malignant neoplasm of pancreas: Secondary | ICD-10-CM | POA: Diagnosis not present

## 2021-12-08 DIAGNOSIS — D6959 Other secondary thrombocytopenia: Secondary | ICD-10-CM | POA: Insufficient documentation

## 2021-12-08 DIAGNOSIS — K219 Gastro-esophageal reflux disease without esophagitis: Secondary | ICD-10-CM | POA: Diagnosis not present

## 2021-12-08 DIAGNOSIS — Z794 Long term (current) use of insulin: Secondary | ICD-10-CM | POA: Insufficient documentation

## 2021-12-08 DIAGNOSIS — L97528 Non-pressure chronic ulcer of other part of left foot with other specified severity: Secondary | ICD-10-CM | POA: Insufficient documentation

## 2021-12-08 DIAGNOSIS — Z95828 Presence of other vascular implants and grafts: Secondary | ICD-10-CM

## 2021-12-08 DIAGNOSIS — B182 Chronic viral hepatitis C: Secondary | ICD-10-CM | POA: Diagnosis not present

## 2021-12-08 DIAGNOSIS — E1165 Type 2 diabetes mellitus with hyperglycemia: Secondary | ICD-10-CM | POA: Insufficient documentation

## 2021-12-08 DIAGNOSIS — K3184 Gastroparesis: Secondary | ICD-10-CM | POA: Insufficient documentation

## 2021-12-08 DIAGNOSIS — F17210 Nicotine dependence, cigarettes, uncomplicated: Secondary | ICD-10-CM | POA: Insufficient documentation

## 2021-12-08 DIAGNOSIS — F32A Depression, unspecified: Secondary | ICD-10-CM | POA: Diagnosis not present

## 2021-12-08 DIAGNOSIS — C250 Malignant neoplasm of head of pancreas: Secondary | ICD-10-CM | POA: Diagnosis present

## 2021-12-08 DIAGNOSIS — Z7982 Long term (current) use of aspirin: Secondary | ICD-10-CM | POA: Insufficient documentation

## 2021-12-08 DIAGNOSIS — Z87891 Personal history of nicotine dependence: Secondary | ICD-10-CM | POA: Insufficient documentation

## 2021-12-08 DIAGNOSIS — Z79899 Other long term (current) drug therapy: Secondary | ICD-10-CM | POA: Insufficient documentation

## 2021-12-08 DIAGNOSIS — Z452 Encounter for adjustment and management of vascular access device: Secondary | ICD-10-CM | POA: Insufficient documentation

## 2021-12-08 DIAGNOSIS — E1142 Type 2 diabetes mellitus with diabetic polyneuropathy: Secondary | ICD-10-CM | POA: Diagnosis not present

## 2021-12-08 DIAGNOSIS — D6481 Anemia due to antineoplastic chemotherapy: Secondary | ICD-10-CM | POA: Insufficient documentation

## 2021-12-08 DIAGNOSIS — I10 Essential (primary) hypertension: Secondary | ICD-10-CM | POA: Diagnosis not present

## 2021-12-08 DIAGNOSIS — Z9221 Personal history of antineoplastic chemotherapy: Secondary | ICD-10-CM | POA: Insufficient documentation

## 2021-12-08 DIAGNOSIS — T451X5A Adverse effect of antineoplastic and immunosuppressive drugs, initial encounter: Secondary | ICD-10-CM | POA: Insufficient documentation

## 2021-12-08 DIAGNOSIS — E11621 Type 2 diabetes mellitus with foot ulcer: Secondary | ICD-10-CM | POA: Diagnosis present

## 2021-12-08 LAB — CMP (CANCER CENTER ONLY)
ALT: 10 U/L (ref 0–44)
AST: 19 U/L (ref 15–41)
Albumin: 3.6 g/dL (ref 3.5–5.0)
Alkaline Phosphatase: 111 U/L (ref 38–126)
Anion gap: 5 (ref 5–15)
BUN: 9 mg/dL (ref 6–20)
CO2: 30 mmol/L (ref 22–32)
Calcium: 9 mg/dL (ref 8.9–10.3)
Chloride: 98 mmol/L (ref 98–111)
Creatinine: 0.43 mg/dL — ABNORMAL LOW (ref 0.61–1.24)
GFR, Estimated: >60 mL/min (ref >60)
Glucose, Bld: 263 mg/dL — ABNORMAL HIGH (ref 70–99)
Potassium: 4.4 mmol/L (ref 3.5–5.1)
Sodium: 133 mmol/L — ABNORMAL LOW (ref 135–145)
Total Bilirubin: 0.3 mg/dL (ref 0.3–1.2)
Total Protein: 7.9 g/dL (ref 6.5–8.1)

## 2021-12-08 LAB — CBC WITH DIFFERENTIAL (CANCER CENTER ONLY)
Abs Immature Granulocytes: 0.05 10*3/uL (ref 0.00–0.07)
Basophils Absolute: 0.1 10*3/uL (ref 0.0–0.1)
Basophils Relative: 1 %
Eosinophils Absolute: 0.2 10*3/uL (ref 0.0–0.5)
Eosinophils Relative: 2 %
HCT: 33.7 % — ABNORMAL LOW (ref 39.0–52.0)
Hemoglobin: 11.3 g/dL — ABNORMAL LOW (ref 13.0–17.0)
Immature Granulocytes: 0 %
Lymphocytes Relative: 17 %
Lymphs Abs: 2 10*3/uL (ref 0.7–4.0)
MCH: 29.7 pg (ref 26.0–34.0)
MCHC: 33.5 g/dL (ref 30.0–36.0)
MCV: 88.5 fL (ref 80.0–100.0)
Monocytes Absolute: 1.1 10*3/uL — ABNORMAL HIGH (ref 0.1–1.0)
Monocytes Relative: 9 %
Neutro Abs: 8.1 10*3/uL — ABNORMAL HIGH (ref 1.7–7.7)
Neutrophils Relative %: 71 %
Platelet Count: 260 10*3/uL (ref 150–400)
RBC: 3.81 MIL/uL — ABNORMAL LOW (ref 4.22–5.81)
RDW: 13.6 % (ref 11.5–15.5)
WBC Count: 11.5 10*3/uL — ABNORMAL HIGH (ref 4.0–10.5)
nRBC: 0 % (ref 0.0–0.2)

## 2021-12-08 MED ORDER — HEPARIN SOD (PORK) LOCK FLUSH 100 UNIT/ML IV SOLN
500.0000 [IU] | Freq: Once | INTRAVENOUS | Status: AC
  Administered 2021-12-08: 500 [IU]

## 2021-12-08 MED ORDER — SODIUM CHLORIDE 0.9% FLUSH
10.0000 mL | Freq: Once | INTRAVENOUS | Status: AC
  Administered 2021-12-08: 10 mL

## 2021-12-08 MED ORDER — OXYCODONE HCL 5 MG PO TABS
5.0000 mg | ORAL_TABLET | Freq: Four times a day (QID) | ORAL | 0 refills | Status: DC | PRN
  Filled 2021-12-08 – 2021-12-09 (×3): qty 120, 30d supply, fill #0

## 2021-12-08 NOTE — Telephone Encounter (Signed)
I called the patient and he was admitted overnight on 12/04/21 with antibiotics and percutaneous aspiration of an abscess in the gallbladder fossa. His WBC count has improved and he goes back for follow up with Dr. Hyman Hopes this Friday. He's no longer having fevers or chills. His pain has improved, and his appetite has also improved. We will check in after his visit on Friday and if doing well, move forward with simulation next week. ?

## 2021-12-08 NOTE — Telephone Encounter (Signed)
Received call from pt requesting refill of his oxycodone '5mg'$  ?Last filled on 11/25/21 for 120 tabs. Pt has been taking 2 tabs each time he takes this. Please refill for 1-2 tablets every 6 hours. Pt uses [facility]. ? ?Asked pt about his abdominal wound. He states it is doing ok. Denies drainage or redness or swelling. It is still tender to touch. ?Had low grade temp on 12/06/21 of 99.4 No fever today. ?He sees his surgeon @ [place] on this Friday,12/12/21 ? ?He had labs done today. WBC is coming down. ? ?He states he is anxious to start his radiation treatments. He is hoping to get the ok from his surgeon this week. ? ?He is still dealing with the wound on his foot. He had an appt @ the [facility] today. ?

## 2021-12-09 ENCOUNTER — Encounter: Payer: Self-pay | Admitting: Hematology and Oncology

## 2021-12-09 ENCOUNTER — Telehealth: Payer: Self-pay

## 2021-12-09 ENCOUNTER — Other Ambulatory Visit (HOSPITAL_COMMUNITY): Payer: Self-pay

## 2021-12-09 MED ORDER — FLUCONAZOLE 200 MG PO TABS
ORAL_TABLET | ORAL | 0 refills | Status: DC
  Filled 2021-12-09: qty 14, 7d supply, fill #0

## 2021-12-09 NOTE — Telephone Encounter (Signed)
Called pt to advise that per Dr. Lorenso Courier, he can increase his  Oxycontin 5 MG IR from 1 to 2 tabs q 6 hours PRN pain. Prescription can be refilled when needed.  ?

## 2021-12-09 NOTE — Telephone Encounter (Signed)
Patient called to say that his current OxyContin 5 mg IR prescription reads "take 1 tab Q 6 hours" and he is requesting that he be able to take 2 tablets every 6 hours. He is enquiring if the prescription can be changed. ?

## 2021-12-10 ENCOUNTER — Ambulatory Visit: Payer: Medicaid Other | Attending: Nurse Practitioner | Admitting: Nurse Practitioner

## 2021-12-10 ENCOUNTER — Other Ambulatory Visit (HOSPITAL_COMMUNITY): Payer: Self-pay

## 2021-12-10 ENCOUNTER — Encounter: Payer: Self-pay | Admitting: Hematology and Oncology

## 2021-12-10 ENCOUNTER — Other Ambulatory Visit: Payer: Self-pay | Admitting: Hematology and Oncology

## 2021-12-10 ENCOUNTER — Other Ambulatory Visit: Payer: Self-pay

## 2021-12-10 ENCOUNTER — Encounter: Payer: Self-pay | Admitting: Nurse Practitioner

## 2021-12-10 VITALS — BP 142/88 | HR 100 | Wt 133.6 lb

## 2021-12-10 DIAGNOSIS — E1140 Type 2 diabetes mellitus with diabetic neuropathy, unspecified: Secondary | ICD-10-CM

## 2021-12-10 DIAGNOSIS — Z794 Long term (current) use of insulin: Secondary | ICD-10-CM

## 2021-12-10 DIAGNOSIS — I700 Atherosclerosis of aorta: Secondary | ICD-10-CM

## 2021-12-10 DIAGNOSIS — E440 Moderate protein-calorie malnutrition: Secondary | ICD-10-CM | POA: Diagnosis not present

## 2021-12-10 DIAGNOSIS — F339 Major depressive disorder, recurrent, unspecified: Secondary | ICD-10-CM

## 2021-12-10 DIAGNOSIS — I10 Essential (primary) hypertension: Secondary | ICD-10-CM | POA: Diagnosis not present

## 2021-12-10 MED ORDER — OXYCODONE HCL 5 MG PO TABS
5.0000 mg | ORAL_TABLET | Freq: Four times a day (QID) | ORAL | 0 refills | Status: DC | PRN
  Filled 2021-12-10 (×2): qty 120, 15d supply, fill #0

## 2021-12-10 MED ORDER — FREESTYLE LIBRE 2 READER DEVI
1 refills | Status: DC
  Filled 2021-12-10: qty 1, 30d supply, fill #0
  Filled 2021-12-29: qty 1, fill #0
  Filled 2022-01-09: qty 1, 90d supply, fill #0
  Filled 2022-01-19: qty 1, 14d supply, fill #0
  Filled 2022-02-09 – 2022-02-16 (×2): qty 1, 14d supply, fill #1

## 2021-12-10 MED ORDER — FREESTYLE LIBRE 14 DAY SENSOR MISC
6 refills | Status: DC
  Filled 2021-12-10 – 2022-01-19 (×4): qty 1, 14d supply, fill #0

## 2021-12-10 NOTE — Progress Notes (Signed)
? ?Assessment & Plan:  ?[name] was seen today for hypertension. ? ?Diagnoses and all orders for this visit: ? ? ?Essential Hypertension ?Stop drinking red bulls as they are loaded with sugar and caffeine and contribute to worsening diabetes and HTN ?Continue losartan as prescribed.  ?Remember to bring in your blood pressure log with you for your follow up appointment.  ?DASH/Mediterranean Diets are healthier choices for HTN.   ? ?Type 2 diabetes mellitus with diabetic neuropathy, with long-term current use of insulin ([place]) ?-     Ambulatory referral to Podiatry ?-     Continuous Blood Gluc Sensor (FREESTYLE LIBRE 14 DAY SENSOR) MISC; Use as instructed. Check blood glucose level by fingerstick 4-5 per day. E11.65 ?-     Continuous Blood Gluc Receiver (FREESTYLE LIBRE 2 READER) DEVI; Use as instructed. Check blood glucose level by fingerstick 4-5 per day. E11.65 ? ?Moderate protein-calorie malnutrition ([place]) ?Continue protein shakes as meal replacement ? ?Major depression, recurrent, chronic ([place]) ?Mother just passed recently  ?Continue cymbalta 60 mg daily ? ?Aortic atherosclerosis ([place]) ?Continue atorvastatin 20 mg daily ? ?Hereditary hemochromatosis ([place]) ?Follow up with oncology as instructed ? ? ? ?Patient has been counseled on age-appropriate routine health concerns for screening and prevention. These are reviewed and up-to-date. Referrals have been placed accordingly. Immunizations are up-to-date or declined.    ?Subjective:  ? ?Chief Complaint  ?Patient presents with  ? Hypertension  ? ?HPI ?[name] [name] 61 y.o. male presents to office for follow up to HTN ?He has a past medical history of Barrett's esophagus (dx 2016), Bronchitis, Chronic hepatitis C without hepatic coma  (10/31/2014), Depression, DM 2, ED, GERD,  Hepatitis C, Hiatal hernia (10/2014), Neuropathy, and S/P transmetatarsal amputation of foot, left  (11/28/2020).  ? ? ?HTN ?Blood pressure is elevated. He is still drinking red bulls daily. He is  currently taking losartan 50 mg daily.  ?BP Readings from Last 3 Encounters:  ?12/10/21 (!) 142/88  ?11/25/21 127/81  ?11/24/21 110/65  ?  ? ?DM  ?Not well controlled. We will prescribe free style libre for better monitoring adherence. He is taking Humulin N 35 units daily  ?He is followed by Endocrinology for his diabetes.  ?Lab Results  ?Component Value Date  ? HGBA1C 9.7 (A) 09/03/2021  ?  ? ?Review of Systems  ?Constitutional:  Negative for fever, malaise/fatigue and weight loss.  ?HENT: Negative.  Negative for nosebleeds.   ?Eyes: Negative.  Negative for blurred vision, double vision and photophobia.  ?Respiratory: Negative.  Negative for cough and shortness of breath.   ?Cardiovascular: Negative.  Negative for chest pain, palpitations and leg swelling.  ?Gastrointestinal: Negative.  Negative for heartburn, nausea and vomiting.  ?Musculoskeletal: Negative.  Negative for myalgias.  ?Neurological: Negative.  Negative for dizziness, focal weakness, seizures and headaches.  ?Psychiatric/Behavioral: Negative.  Negative for suicidal ideas.   ? ?Past Medical History:  ?Diagnosis Date  ? Barrett's esophagus dx 2016  ? Bronchitis   ? Chronic hepatitis C without hepatic coma ([place]) 10/31/2014  ? Depression   ? Diabetes ([place])   ? ED (erectile dysfunction)   ? GERD (gastroesophageal reflux disease)   ? Hepatitis C   ? Hiatal hernia 10/2014  ? 3cm  ? Neuropathy   ? S/P transmetatarsal amputation of foot, left ([place]) 11/28/2020  ? ? ?Past Surgical History:  ?Procedure Laterality Date  ? AMPUTATION Left 11/15/2020  ? Procedure: LEFT TRANSMETATARSALS AMPUTATION;  Surgeon: Newt Minion, MD;  Location: [facility];  Service:  Orthopedics;  Laterality: Left;  ? APPENDECTOMY    ? BILIARY STENT PLACEMENT N/A 02/27/2021  ? Procedure: BILIARY STENT PLACEMENT;  Surgeon: Milus Banister, MD;  Location: [facility];  Service: Endoscopy;  Laterality: N/A;  ? BILIARY STENT PLACEMENT N/A 03/27/2021  ? Procedure: BILIARY STENT PLACEMENT;  Surgeon:  Jackquline Denmark, MD;  [facility];  Service: Endoscopy;  Laterality: N/A;  ? BIOPSY  03/27/2021  ? Procedure: BIOPSY;  Surgeon: Jackquline Denmark, MD;  Location: [facility];  Service: Endoscopy;;  ? COLONOSCOPY N/A 02/16/2014  ? Procedure: COLONOSCOPY;  Surgeon: Gatha Mayer, MD;  [facility];  Service: Endoscopy;  Laterality: N/A;  ? COLONOSCOPY    ? ENDOSCOPIC RETROGRADE CHOLANGIOPANCREATOGRAPHY (ERCP) WITH PROPOFOL N/A 02/27/2021  ? Procedure: ENDOSCOPIC RETROGRADE CHOLANGIOPANCREATOGRAPHY (ERCP) WITH PROPOFOL;  Surgeon: Milus Banister, MD;  [facility];  Service: Endoscopy;  Laterality: N/A;  ? ERCP N/A 03/27/2021  ? Procedure: ENDOSCOPIC RETROGRADE CHOLANGIOPANCREATOGRAPHY (ERCP);  Surgeon: Jackquline Denmark, MD;  [facility];  Service: Endoscopy;  Laterality: N/A;  ? ESOPHAGOGASTRODUODENOSCOPY (EGD) WITH PROPOFOL N/A 05/06/2021  ? Procedure: ESOPHAGOGASTRODUODENOSCOPY (EGD) WITH PROPOFOL;  Surgeon: Mauri Pole, MD;  [facility];  Service: Endoscopy;  Laterality: N/A;  ? EUS N/A 02/27/2021  ? Procedure: UPPER ENDOSCOPIC ULTRASOUND (EUS) RADIAL;  Surgeon: Milus Banister, MD;  [facility];  Service: Endoscopy;  Laterality: N/A;  ? FINE NEEDLE ASPIRATION N/A 02/27/2021  ? Procedure: FINE NEEDLE ASPIRATION (FNA) LINEAR;  Surgeon: Milus Banister, MD;  [facility];  Service: Endoscopy;  Laterality: N/A;  ? IR IMAGING GUIDED PORT INSERTION  03/21/2021  ? SPHINCTEROTOMY  02/27/2021  ? Procedure: SPHINCTEROTOMY;  Surgeon: Milus Banister, MD;  Location[facility];  Service: Endoscopy;;  ? STENT REMOVAL  03/27/2021  ? Procedure: STENT REMOVAL;  Surgeon: Jackquline Denmark, MD;  [facility];  Service: Endoscopy;;  ? TONSILLECTOMY    ? ? ?Family History  ?Problem Relation Age of Onset  ? Breast cancer Mother   ? Stroke Father   ? Alcohol abuse Father   ? Heart disease Maternal Grandfather   ? Pancreatic cancer Paternal Grandmother   ?  Diabetes Paternal Grandfather   ? Colon cancer Neg Hx   ? Stomach cancer Neg Hx   ? Rectal cancer Neg Hx   ? Esophageal cancer Neg Hx   ? ? ?Social History Reviewed with no changes to be made today.  ? ?Outpatient Medications Prior to Visit  ?Medication Sig Dispense Refill  ? Accu-Chek Softclix Lancets lancets Check blood glucose level by fingerstick 3-4 times per day. 100 each 3  ? Acetaminophen (TYLENOL) 325 MG CAPS TAKE 2 CAPSULES BY MOUTH EVERY 6 HOURS AS DIRECTED 30 capsule 0  ? albuterol (VENTOLIN HFA) 108 (90 Base) MCG/ACT inhaler Inhale 2 puffs into the lungs every 6 (six) hours as needed for wheezing or shortness of breath. 18 g 0  ? amoxicillin-clavulanate (AUGMENTIN) 875-125 MG tablet Take 1 tablet (875 mg total) by mouth every 12 (twelve) hours for 7 days 14 tablet 0  ? aspirin 81 MG EC tablet Take 1 tablet by mouth daily. 100 tablet 0  ? atorvastatin (LIPITOR) 20 MG tablet Take 1 tablet (20 mg total) by mouth daily. 90 tablet 0  ? Blood Glucose Monitoring Suppl (ACCU-CHEK GUIDE) w/Device KIT Check blood glucose level by fingerstick 3-4 times per day. 1 kit 0  ? chlorhexidine (PERIDEX) 0.12 % solution Rinse between teeth using irrigation syringe (15 mL)  2-3 times a day or as needed for gum irritation. 473 mL 2  ? diphenoxylate-atropine (LOMOTIL) 2.5-0.025 MG tablet Take 1 tablet by mouth 4 (four) times daily as needed for diarrhea or loose stools. 60 tablet 1  ? doxycycline (MONODOX) 100 MG capsule Take by mouth.    ? DULoxetine (CYMBALTA) 60 MG capsule Take 1 capsule (60 mg total) by mouth daily. 90 capsule 0  ? EPINEPHrine 0.3 mg/0.3 mL IJ SOAJ injection Inject 0.3 mg into the muscle once as needed for anaphylaxis.    ? fluconazole (DIFLUCAN) 200 MG tablet Take 2 tablets by mouth once daily for 7 days 14 tablet 0  ? folic acid (FOLVITE) 1 MG tablet Take 1 tablet (1 mg total) by mouth in the morning.    ? glucose blood (ACCU-CHEK GUIDE) test strip Check blood glucose level by fingerstick 3 - 4 times per  day. 100 each 12  ? Insulin NPH, Human,, Isophane, (HUMULIN N KWIKPEN) 100 UNIT/ML Kiwkpen Inject 35 Units into the skin every morning. 45 mL 3  ? Insulin Pen Needle (TRUEPLUS PEN NEEDLES) 32G X 4 MM MISC

## 2021-12-11 ENCOUNTER — Encounter: Payer: Self-pay | Admitting: Hematology and Oncology

## 2021-12-12 ENCOUNTER — Other Ambulatory Visit (HOSPITAL_COMMUNITY): Payer: Self-pay

## 2021-12-15 ENCOUNTER — Telehealth: Payer: Self-pay | Admitting: Radiation Oncology

## 2021-12-15 ENCOUNTER — Encounter (HOSPITAL_BASED_OUTPATIENT_CLINIC_OR_DEPARTMENT_OTHER): Payer: Medicaid Other | Admitting: General Surgery

## 2021-12-15 ENCOUNTER — Encounter: Payer: Self-pay | Admitting: Radiation Oncology

## 2021-12-15 DIAGNOSIS — E11621 Type 2 diabetes mellitus with foot ulcer: Secondary | ICD-10-CM | POA: Diagnosis not present

## 2021-12-15 NOTE — Telephone Encounter (Addendum)
I called to follow up with the patient. He was seen by Dr. Hyman Hopes at [facility] on Friday and the team recommends replacement of a drain. He will have this placed on 12/18/21. I'll check with him in about a week and a half to see where he is with management of his abscess, and hopefully we can proceed with radiation treatment planning in early May. ? ? ?
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
## 2021-12-16 ENCOUNTER — Ambulatory Visit: Payer: Medicaid Other | Admitting: Endocrinology

## 2021-12-17 ENCOUNTER — Ambulatory Visit: Payer: Medicaid Other | Admitting: Podiatry

## 2021-12-19 DIAGNOSIS — R188 Other ascites: Secondary | ICD-10-CM | POA: Diagnosis present

## 2021-12-20 ENCOUNTER — Encounter: Payer: Self-pay | Admitting: Nurse Practitioner

## 2021-12-22 ENCOUNTER — Inpatient Hospital Stay: Payer: Medicaid Other

## 2021-12-22 ENCOUNTER — Ambulatory Visit: Payer: Medicaid Other | Admitting: Podiatrist

## 2021-12-22 ENCOUNTER — Other Ambulatory Visit: Payer: Self-pay

## 2021-12-22 ENCOUNTER — Other Ambulatory Visit (HOSPITAL_COMMUNITY): Payer: Self-pay

## 2021-12-22 ENCOUNTER — Inpatient Hospital Stay (HOSPITAL_BASED_OUTPATIENT_CLINIC_OR_DEPARTMENT_OTHER): Payer: Medicaid Other | Admitting: Hematology and Oncology

## 2021-12-22 ENCOUNTER — Encounter (HOSPITAL_BASED_OUTPATIENT_CLINIC_OR_DEPARTMENT_OTHER): Payer: Medicaid Other | Admitting: General Surgery

## 2021-12-22 ENCOUNTER — Other Ambulatory Visit: Payer: Self-pay | Admitting: Hematology and Oncology

## 2021-12-22 VITALS — BP 132/84 | HR 99 | Temp 97.7°F | Resp 16 | Ht 71.0 in | Wt 134.2 lb

## 2021-12-22 DIAGNOSIS — Z452 Encounter for adjustment and management of vascular access device: Secondary | ICD-10-CM | POA: Diagnosis not present

## 2021-12-22 DIAGNOSIS — T8149XA Infection following a procedure, other surgical site, initial encounter: Secondary | ICD-10-CM | POA: Diagnosis not present

## 2021-12-22 DIAGNOSIS — Z95828 Presence of other vascular implants and grafts: Secondary | ICD-10-CM

## 2021-12-22 DIAGNOSIS — C250 Malignant neoplasm of head of pancreas: Secondary | ICD-10-CM

## 2021-12-22 DIAGNOSIS — E11621 Type 2 diabetes mellitus with foot ulcer: Secondary | ICD-10-CM | POA: Diagnosis not present

## 2021-12-22 LAB — CBC WITH DIFFERENTIAL (CANCER CENTER ONLY)
Abs Immature Granulocytes: 0.04 10*3/uL (ref 0.00–0.07)
Basophils Absolute: 0.1 10*3/uL (ref 0.0–0.1)
Basophils Relative: 1 %
Eosinophils Absolute: 0.1 10*3/uL (ref 0.0–0.5)
Eosinophils Relative: 1 %
HCT: 33.7 % — ABNORMAL LOW (ref 39.0–52.0)
Hemoglobin: 11.4 g/dL — ABNORMAL LOW (ref 13.0–17.0)
Immature Granulocytes: 0 %
Lymphocytes Relative: 16 %
Lymphs Abs: 1.8 10*3/uL (ref 0.7–4.0)
MCH: 28.9 pg (ref 26.0–34.0)
MCHC: 33.8 g/dL (ref 30.0–36.0)
MCV: 85.3 fL (ref 80.0–100.0)
Monocytes Absolute: 1.6 10*3/uL — ABNORMAL HIGH (ref 0.1–1.0)
Monocytes Relative: 15 %
Neutro Abs: 7.5 10*3/uL (ref 1.7–7.7)
Neutrophils Relative %: 67 %
Platelet Count: 205 10*3/uL (ref 150–400)
RBC: 3.95 MIL/uL — ABNORMAL LOW (ref 4.22–5.81)
RDW: 14.2 % (ref 11.5–15.5)
WBC Count: 11.2 10*3/uL — ABNORMAL HIGH (ref 4.0–10.5)
nRBC: 0 % (ref 0.0–0.2)

## 2021-12-22 LAB — CMP (CANCER CENTER ONLY)
ALT: 18 U/L (ref 0–44)
AST: 16 U/L (ref 15–41)
Albumin: 3.6 g/dL (ref 3.5–5.0)
Alkaline Phosphatase: 182 U/L — ABNORMAL HIGH (ref 38–126)
Anion gap: 9 (ref 5–15)
BUN: 8 mg/dL (ref 6–20)
CO2: 27 mmol/L (ref 22–32)
Calcium: 9.1 mg/dL (ref 8.9–10.3)
Chloride: 92 mmol/L — ABNORMAL LOW (ref 98–111)
Creatinine: 0.53 mg/dL — ABNORMAL LOW (ref 0.61–1.24)
GFR, Estimated: >60 mL/min (ref >60)
Glucose, Bld: 291 mg/dL — ABNORMAL HIGH (ref 70–99)
Potassium: 4.2 mmol/L (ref 3.5–5.1)
Sodium: 128 mmol/L — ABNORMAL LOW (ref 135–145)
Total Bilirubin: 0.5 mg/dL (ref 0.3–1.2)
Total Protein: 7.8 g/dL (ref 6.5–8.1)

## 2021-12-22 LAB — LACTATE DEHYDROGENASE: LDH: 107 U/L (ref 98–192)

## 2021-12-22 MED ORDER — XTAMPZA ER 9 MG PO C12A
9.0000 mg | EXTENDED_RELEASE_CAPSULE | Freq: Two times a day (BID) | ORAL | 0 refills | Status: DC
  Filled 2021-12-22: qty 60, 30d supply, fill #0

## 2021-12-22 MED ORDER — SODIUM CHLORIDE 0.9% FLUSH
10.0000 mL | Freq: Once | INTRAVENOUS | Status: AC
  Administered 2021-12-22: 10 mL

## 2021-12-22 MED ORDER — HEPARIN SOD (PORK) LOCK FLUSH 100 UNIT/ML IV SOLN
500.0000 [IU] | Freq: Once | INTRAVENOUS | Status: AC
  Administered 2021-12-22: 500 [IU]

## 2021-12-22 NOTE — Progress Notes (Signed)
?[place] ?[phone]   [phone] ? ?PROGRESS NOTE ? ?Patient Care Team: ?[name], NP as PCP - General (Nurse Practitioner) ?Armbruster, Carlota Raspberry, MD as Consulting Physician (Gastroenterology) ?Orson Slick, MD as Consulting Physician (Oncology) ?Royston Bake, RN as Equities trader ? ?Hematological/Oncological History ?# Adenocarcinoma of the Pancreas, Stage IIB ?02/26/2021: patient seen by GI for jaundice and pancreatic mass causing CBD obstruction. CT abdomen showed pancreatic head mass (4.4 x 3.7 cm) and enlarged portocaval lymph nodes. ?02/27/2021: ERCP with placement of a nonmetallic biliary stent. FNA of pancreatic head showed malignant cells consistent with pancreatic adenocarcinoma. ?03/05/2021: CT Chest showed no evidence of metastatic disease in the chest. ?03/12/2021: establish care with Dr. Lorenso Courier ?04/10/2021: Cycle 1 Day 1 of FOLFIRINOX ?04/24/2021: Cycle 2 Day 1 of FOLFIRINOX ?05/22/2021: Cycle 3 Day 1 of FOLFIRINOX. Delayed x 2 weeks due to hospitalization for poor po intake and dehydration due to gastroparesis. ?06/05/2021: Cycle 4 Day 1 of FOLFIRINOX ?06/19/2021: Cycle 5 Day 1 of FOLFIRINOX ?07/02/2021: Cycle 6 Day 1 of FOLFIRINOX ?07/16/2021: Cycle 7 Day 1 of FOLFIRINOX  ?07/30/2021: Cycle 8 Day 1 of FOLFIRINOX  ?08/13/2021: Cycle 9 Day 1 of FOLFIRINOX  ?08/27/2021:  Cycle 10 Day 1 of FOLFIRINOX  ?09/10/2021: Cycle 11 Day 1 of FOLFIRINOX  ?09/24/2021: Cycle 12 Day 1 of FOLFIRINOX  ?10/28/2021: Underwent open whipple with portal vein reconstruction utilizing a R deep FV graft at [facility]. Positive margins noted.  ? ?Interval History:  ?[name] [name] 61 y.o. male returns for a follow up visit for pancreatic cancer. He was last seen on 11/24/2021. In the interim since his last visit he had an abscess drainage performed by surgery 12/12/2021.  ? ?On exam today [name] [name] notes he unfortunately accidentally had his drain pulled last night.  He reports he got up  in the melanite and unfortunately without thinking pulled his drain out while getting out of bed.  He notes that his energy levels remain low low low and that he can break a sweat just walking from 1 room to another.  He reports that his appetite is been quite poor he is been subsisting mostly on protein shakes which unfortunately did not appear to boost in his sugar levels.  His pain has been reasonably well controlled with no he is taking 10 mg of oxycodone every 6 hours.  He continues to have regular bowel movements and notes that he might not have a bowel movement 1 or 2 days/week total.  He continues on antibiotic therapy per the recommendations of surgery.  He is not having any other systemic symptoms at this time. He denies any diarrhea, constipation, sweats, headache, or vision changes.  A full 10 point ROS is listed below. ? ?MEDICAL HISTORY:  ?Past Medical History:  ?Diagnosis Date  ? Barrett's esophagus dx 2016  ? Bronchitis   ? Chronic hepatitis C without hepatic coma ([place]) 10/31/2014  ? Depression   ? Diabetes ([place])   ? ED (erectile dysfunction)   ? GERD (gastroesophageal reflux disease)   ? Hepatitis C   ? Hiatal hernia 10/2014  ? 3cm  ? Neuropathy   ? S/P transmetatarsal amputation of foot, left ([place]) 11/28/2020  ? ? ?SURGICAL HISTORY: ?Past Surgical History:  ?Procedure Laterality Date  ? AMPUTATION Left 11/15/2020  ? Procedure: LEFT TRANSMETATARSALS AMPUTATION;  Surgeon: Newt Minion, MD;  Location: [facility];  Service: Orthopedics;  Laterality: Left;  ? APPENDECTOMY    ? BILIARY  STENT PLACEMENT N/A 02/27/2021  ? Procedure: BILIARY STENT PLACEMENT;  Surgeon: Milus Banister, MD;  Location: [facility];  Service: Endoscopy;  Laterality: N/A;  ? BILIARY STENT PLACEMENT N/A 03/27/2021  ? Procedure: BILIARY STENT PLACEMENT;  Surgeon: Jackquline Denmark, MD;  Location: [facility];  Service: Endoscopy;  Laterality: N/A;  ? BIOPSY  03/27/2021  ? Procedure: BIOPSY;  Surgeon: Jackquline Denmark, MD;  Location: [facility];  Service: Endoscopy;;  ? COLONOSCOPY N/A 02/16/2014  ? Procedure: COLONOSCOPY;  Surgeon: Gatha Mayer, MD;  Location: [facility];  Service: Endoscopy;  Laterality: N/A;  ? COLONOSCOPY    ? ENDOSCOPIC RETROGRADE CHOLANGIOPANCREATOGRAPHY (ERCP) WITH PROPOFOL N/A 02/27/2021  ? Procedure: ENDOSCOPIC RETROGRADE CHOLANGIOPANCREATOGRAPHY (ERCP) WITH PROPOFOL;  Surgeon: Milus Banister, MD;  Location: [facility];  Service: Endoscopy;  Laterality: N/A;  ? ERCP N/A 03/27/2021  ? Procedure: ENDOSCOPIC RETROGRADE CHOLANGIOPANCREATOGRAPHY (ERCP);  Surgeon: Jackquline Denmark, MD;  Location: [facility];  Service: Endoscopy;  Laterality: N/A;  ? ESOPHAGOGASTRODUODENOSCOPY (EGD) WITH PROPOFOL N/A 05/06/2021  ? Procedure: ESOPHAGOGASTRODUODENOSCOPY (EGD) WITH PROPOFOL;  Surgeon: Mauri Pole, MD;  Location: [facility];  Service: Endoscopy;  Laterality: N/A;  ? EUS N/A 02/27/2021  ? Procedure: UPPER ENDOSCOPIC ULTRASOUND (EUS) RADIAL;  Surgeon: Milus Banister, MD;  Location: [facility];  Service: Endoscopy;  Laterality: N/A;  ? FINE NEEDLE ASPIRATION N/A 02/27/2021  ? Procedure: FINE NEEDLE ASPIRATION (FNA) LINEAR;  Surgeon: Milus Banister, MD;  Location: [facility];  Service: Endoscopy;  Laterality: N/A;  ? IR IMAGING GUIDED PORT INSERTION  03/21/2021  ? SPHINCTEROTOMY  02/27/2021  ? Procedure: SPHINCTEROTOMY;  Surgeon: Milus Banister, MD;  Location: [facility];  Service: Endoscopy;;  ? STENT REMOVAL  03/27/2021  ? Procedure: STENT REMOVAL;  Surgeon: Jackquline Denmark, MD;  Location: [facility];  Service: Endoscopy;;  ? TONSILLECTOMY    ? ? ?SOCIAL HISTORY: ?Social History  ? ?Socioeconomic History  ? Marital status: Single  ?  Spouse name: Not on file  ? Number of children: Not on file  ? Years of education: Not on file  ? Highest education level: Not on file  ?Occupational History  ? Not on file  ?Tobacco Use  ? Smoking status: Every Day  ?  Packs/day: 0.50  ?  Years: 35.00  ?  Pack years: 17.50  ?  Types:  Cigarettes  ? Smokeless tobacco: Never  ? Tobacco comments:  ?  Currently being followed by the smoking cessation program   ?Vaping Use  ? Vaping Use: Never used  ?Substance and Sexual Activity  ? Alcohol use: Not Currently  ?  Comment: previous  ? Drug use: No  ? Sexual activity: Not on file  ?Other Topics Concern  ? Not on file  ?Social History Narrative  ? Not on file  ? ?Social Determinants of Health  ? ?Financial Resource Strain: High Risk  ? Difficulty of Paying Living Expenses: Very hard  ?Food Insecurity: Food Insecurity Present  ? Worried About Charity fundraiser in the Last Year: Sometimes true  ? Ran Out of Food in the Last Year: Sometimes true  ?Transportation Needs: Unmet Transportation Needs  ? Lack of Transportation (Medical): Yes  ? Lack of Transportation (Non-Medical): Yes  ?Physical Activity: Not on file  ?Stress: Not on file  ?Social Connections: Not on file  ?Intimate Partner Violence: Not At Risk  ? Fear of Current or Ex-Partner: No  ? Emotionally Abused: No  ? Physically Abused: No  ? Sexually  Abused: No  ? ? ?FAMILY HISTORY: ?Family History  ?Problem Relation Age of Onset  ? Breast cancer Mother   ? Stroke Father   ? Alcohol abuse Father   ? Heart disease Maternal Grandfather   ? Pancreatic cancer Paternal Grandmother   ? Diabetes Paternal Grandfather   ? Colon cancer Neg Hx   ? Stomach cancer Neg Hx   ? Rectal cancer Neg Hx   ? Esophageal cancer Neg Hx   ? ? ?ALLERGIES:  is allergic to bee venom and lactose intolerance (gi). ? ?MEDICATIONS:  ?Current Outpatient Medications  ?Medication Sig Dispense Refill  ? acetaminophen (TYLENOL) 325 MG tablet Take by mouth.    ? fluconazole (DIFLUCAN) 200 MG tablet Take by mouth.    ? oxyCODONE ER (XTAMPZA ER) 9 MG C12A Take 9 mg by mouth every 12 (twelve) hours. 60 capsule 0  ? Accu-Chek Softclix Lancets lancets Check blood glucose level by fingerstick 3-4 times per day. 100 each 3  ? albuterol (VENTOLIN HFA) 108 (90 Base) MCG/ACT inhaler Inhale 2 puffs  into the lungs every 6 (six) hours as needed for wheezing or shortness of breath. 18 g 0  ? aspirin 81 MG EC tablet Take 1 tablet by mouth daily. 100 tablet 0  ? atorvastatin (LIPITOR) 20 MG tablet Take 1 ta

## 2021-12-22 NOTE — Progress Notes (Signed)
[name] [name], [name] [name] ([id number]) ?Visit Report for 12/22/2021 ?Fall Risk Assessment Details ?Patient Name: Date of Service: ?[name], [name] 12/22/2021 7:30 A M ?Medical Record Number: [id number] ?Patient Account Number: [id number] ?Date of Birth/Sex: Treating RN: ?[birth year] (61 y.o. M) ?Primary Care Damont Balles: [name] Other Clinician: ?Referring Sharan Mcenaney: ?Treating Tymier Lindholm/Extender: [name] ?[name] ?Weeks in Treatment: 13 ?Fall Risk Assessment Items ?Have you had 2 or more falls in the last 12 monthso 0 Yes ?Have you had any fall that resulted in injury in the last 12 monthso 0 No ?FALLS RISK SCREEN ?History of falling - immediate or within 3 months 25 Yes ?Secondary diagnosis (Do you have 2 or more medical diagnoseso) 15 Yes ?Ambulatory aid ?None/bed rest/wheelchair/nurse 0 No ?Crutches/cane/walker 0 No ?Furniture 0 No ?Intravenous therapy Access/Saline/Heparin Lock 0 No ?Gait/Transferring ?Normal/ bed rest/ wheelchair 0 No ?Weak (short steps with or without shuffle, stooped but able to lift head while walking, may seek 0 No ?support from furniture) ?Impaired (short steps with shuffle, may have difficulty arising from chair, head down, impaired 0 No ?balance) ?Mental Status ?Oriented to own ability 0 Yes ?Electronic Signature(s) ?Signed: 12/22/2021 4:50:41 PM By: Adline Peals ?Entered By: Adline Peals on 12/22/2021 08:10:19 ?
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
## 2021-12-23 ENCOUNTER — Other Ambulatory Visit (HOSPITAL_COMMUNITY): Payer: Self-pay

## 2021-12-23 ENCOUNTER — Telehealth: Payer: Self-pay | Admitting: Hematology and Oncology

## 2021-12-23 NOTE — Telephone Encounter (Signed)
Scheduled per 4/24 los, pt has been called and confirmed  ?
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
## 2021-12-24 ENCOUNTER — Telehealth: Payer: Self-pay

## 2021-12-24 ENCOUNTER — Other Ambulatory Visit (HOSPITAL_COMMUNITY): Payer: Self-pay

## 2021-12-24 ENCOUNTER — Ambulatory Visit: Payer: Medicaid Other | Admitting: Podiatry

## 2021-12-24 NOTE — Telephone Encounter (Signed)
Notified Patient of prior authorization approval for Xtampza ER '9mg'$ . Medication is approved through 12/24/2022. No other concerns or needs voiced at this time. ?

## 2021-12-25 ENCOUNTER — Emergency Department (HOSPITAL_COMMUNITY): Payer: Medicaid Other

## 2021-12-25 ENCOUNTER — Other Ambulatory Visit: Payer: Self-pay

## 2021-12-25 ENCOUNTER — Encounter (HOSPITAL_COMMUNITY): Payer: Self-pay

## 2021-12-25 ENCOUNTER — Emergency Department (HOSPITAL_COMMUNITY)
Admission: EM | Admit: 2021-12-25 | Discharge: 2021-12-25 | Payer: Medicaid Other | Attending: Emergency Medicine | Admitting: Emergency Medicine

## 2021-12-25 DIAGNOSIS — Z79899 Other long term (current) drug therapy: Secondary | ICD-10-CM | POA: Diagnosis not present

## 2021-12-25 DIAGNOSIS — Z7982 Long term (current) use of aspirin: Secondary | ICD-10-CM | POA: Insufficient documentation

## 2021-12-25 DIAGNOSIS — I10 Essential (primary) hypertension: Secondary | ICD-10-CM | POA: Insufficient documentation

## 2021-12-25 DIAGNOSIS — R109 Unspecified abdominal pain: Secondary | ICD-10-CM | POA: Diagnosis present

## 2021-12-25 DIAGNOSIS — Z794 Long term (current) use of insulin: Secondary | ICD-10-CM | POA: Insufficient documentation

## 2021-12-25 DIAGNOSIS — Z7984 Long term (current) use of oral hypoglycemic drugs: Secondary | ICD-10-CM | POA: Insufficient documentation

## 2021-12-25 DIAGNOSIS — Z8507 Personal history of malignant neoplasm of pancreas: Secondary | ICD-10-CM | POA: Diagnosis not present

## 2021-12-25 DIAGNOSIS — K750 Abscess of liver: Secondary | ICD-10-CM | POA: Diagnosis not present

## 2021-12-25 DIAGNOSIS — E1165 Type 2 diabetes mellitus with hyperglycemia: Secondary | ICD-10-CM | POA: Diagnosis not present

## 2021-12-25 LAB — CBC WITH DIFFERENTIAL/PLATELET
Abs Immature Granulocytes: 0.11 10*3/uL — ABNORMAL HIGH (ref 0.00–0.07)
Basophils Absolute: 0.1 10*3/uL (ref 0.0–0.1)
Basophils Relative: 0 %
Eosinophils Absolute: 0 10*3/uL (ref 0.0–0.5)
Eosinophils Relative: 0 %
HCT: 35 % — ABNORMAL LOW (ref 39.0–52.0)
Hemoglobin: 11.5 g/dL — ABNORMAL LOW (ref 13.0–17.0)
Immature Granulocytes: 1 %
Lymphocytes Relative: 17 %
Lymphs Abs: 2.6 10*3/uL (ref 0.7–4.0)
MCH: 28.8 pg (ref 26.0–34.0)
MCHC: 32.9 g/dL (ref 30.0–36.0)
MCV: 87.5 fL (ref 80.0–100.0)
Monocytes Absolute: 1.8 10*3/uL — ABNORMAL HIGH (ref 0.1–1.0)
Monocytes Relative: 11 %
Neutro Abs: 10.8 10*3/uL — ABNORMAL HIGH (ref 1.7–7.7)
Neutrophils Relative %: 71 %
Platelets: 256 10*3/uL (ref 150–400)
RBC: 4 MIL/uL — ABNORMAL LOW (ref 4.22–5.81)
RDW: 14.7 % (ref 11.5–15.5)
WBC: 15.3 10*3/uL — ABNORMAL HIGH (ref 4.0–10.5)
nRBC: 0 % (ref 0.0–0.2)

## 2021-12-25 LAB — CBG MONITORING, ED: Glucose-Capillary: 193 mg/dL — ABNORMAL HIGH (ref 70–99)

## 2021-12-25 LAB — BLOOD GAS, VENOUS
Acid-base deficit: 1.8 mmol/L (ref 0.0–2.0)
Bicarbonate: 23.1 mmol/L (ref 20.0–28.0)
O2 Saturation: 63.4 %
Patient temperature: 37
pCO2, Ven: 39 mmHg — ABNORMAL LOW (ref 44–60)
pH, Ven: 7.38 (ref 7.25–7.43)
pO2, Ven: 35 mmHg (ref 32–45)

## 2021-12-25 LAB — COMPREHENSIVE METABOLIC PANEL
ALT: 15 U/L (ref 0–44)
AST: 17 U/L (ref 15–41)
Albumin: 3.3 g/dL — ABNORMAL LOW (ref 3.5–5.0)
Alkaline Phosphatase: 176 U/L — ABNORMAL HIGH (ref 38–126)
Anion gap: 16 — ABNORMAL HIGH (ref 5–15)
BUN: 8 mg/dL (ref 6–20)
CO2: 19 mmol/L — ABNORMAL LOW (ref 22–32)
Calcium: 8.9 mg/dL (ref 8.9–10.3)
Chloride: 91 mmol/L — ABNORMAL LOW (ref 98–111)
Creatinine, Ser: 0.67 mg/dL (ref 0.61–1.24)
GFR, Estimated: >60 mL/min (ref >60)
Glucose, Bld: 283 mg/dL — ABNORMAL HIGH (ref 70–99)
Potassium: 4.1 mmol/L (ref 3.5–5.1)
Sodium: 126 mmol/L — ABNORMAL LOW (ref 135–145)
Total Bilirubin: 1.7 mg/dL — ABNORMAL HIGH (ref 0.3–1.2)
Total Protein: 7.8 g/dL (ref 6.5–8.1)

## 2021-12-25 LAB — LIPASE, BLOOD: Lipase: 17 U/L (ref 11–51)

## 2021-12-25 MED ORDER — MORPHINE SULFATE (PF) 4 MG/ML IV SOLN
4.0000 mg | Freq: Once | INTRAVENOUS | Status: AC
  Administered 2021-12-25: 4 mg via INTRAVENOUS
  Filled 2021-12-25: qty 1

## 2021-12-25 MED ORDER — SODIUM CHLORIDE (PF) 0.9 % IJ SOLN
INTRAMUSCULAR | Status: AC
  Filled 2021-12-25: qty 50

## 2021-12-25 MED ORDER — SODIUM CHLORIDE 0.9 % IV BOLUS
1000.0000 mL | Freq: Once | INTRAVENOUS | Status: AC
  Administered 2021-12-25: 1000 mL via INTRAVENOUS

## 2021-12-25 MED ORDER — INSULIN ASPART 100 UNIT/ML IJ SOLN
5.0000 [IU] | Freq: Once | INTRAMUSCULAR | Status: AC
Start: 2021-12-25 — End: 2021-12-25
  Administered 2021-12-25: 5 [IU] via SUBCUTANEOUS
  Filled 2021-12-25: qty 0.05

## 2021-12-25 MED ORDER — FENTANYL CITRATE PF 50 MCG/ML IJ SOSY
50.0000 ug | PREFILLED_SYRINGE | Freq: Once | INTRAMUSCULAR | Status: AC
  Administered 2021-12-25: 50 ug via INTRAVENOUS
  Filled 2021-12-25: qty 1

## 2021-12-25 MED ORDER — PIPERACILLIN-TAZOBACTAM 3.375 G IVPB: 3.3750 g | Freq: Three times a day (TID) | INTRAVENOUS | Status: DC

## 2021-12-25 MED ORDER — SODIUM CHLORIDE 0.9 % IV SOLN: Freq: Once | INTRAVENOUS | Status: AC

## 2021-12-25 MED ORDER — PIPERACILLIN-TAZOBACTAM 3.375 G IVPB 30 MIN
3.3750 g | Freq: Once | INTRAVENOUS | Status: AC
  Administered 2021-12-25: 3.375 g via INTRAVENOUS
  Filled 2021-12-25: qty 50

## 2021-12-25 MED ORDER — IOHEXOL 300 MG/ML  SOLN
100.0000 mL | Freq: Once | INTRAMUSCULAR | Status: AC | PRN
  Administered 2021-12-25: 100 mL via INTRAVENOUS

## 2021-12-25 MED ORDER — INSULIN ASPART 100 UNIT/ML IJ SOLN
0.0000 [IU] | Freq: Every day | INTRAMUSCULAR | Status: DC
  Filled 2021-12-25: qty 0.05

## 2021-12-25 MED ORDER — INSULIN ASPART 100 UNIT/ML IJ SOLN
0.0000 [IU] | Freq: Three times a day (TID) | INTRAMUSCULAR | Status: DC
  Administered 2021-12-25: 3 [IU] via SUBCUTANEOUS
  Filled 2021-12-25: qty 0.15

## 2021-12-25 NOTE — ED Provider Notes (Signed)
?[place] DEPT ?Provider Note ? ? ?CSN: [id number] ?Arrival date & time: 12/25/21  0755 ? ?  ? ?History ? ?Chief Complaint  ?Patient presents with  ? Abdominal Pain  ? ? ?[name] [name] is a 61 y.o. male. ? ?Patient with history of pancreatic cancer status post Whipple procedure several months ago and now status post abdominal drain for fluid collection postsurgical.  This was placed last week but drain fell out about 4 to 5 days after.  He had a abscess at his surgical site.  He has been on moxifloxacin.  He denies any fevers or chills.  He was supposed to have the drain for 2 weeks.  He saw his oncologist 2 days ago and was recommended to call his surgeon at [facility] about this.  They did not have any immediate feedback about this and thought that maybe he would be fine.  He has had some intermittent pain in this area but denies any fevers or chills or drainage.  Has taken his home narcotics with some improvement.  Denies any nausea vomiting diarrhea.  States that he was having maybe 1 or 2 cc of drainage a day before the drain fell out. ? ? ?Abdominal Pain ? ?  ? ?Home Medications ?Prior to Admission medications   ?Medication Sig Start Date End Date Taking? Authorizing Provider  ?Accu-Chek Softclix Lancets lancets Check blood glucose level by fingerstick 3-4 times per day. 07/04/21   Charlott Rakes, MD  ?acetaminophen (TYLENOL) 325 MG tablet Take by mouth. 12/05/21   [provider]  ?albuterol (VENTOLIN HFA) 108 (90 Base) MCG/ACT inhaler Inhale 2 puffs into the lungs every 6 (six) hours as needed for wheezing or shortness of breath. 11/25/21   Orson Slick, MD  ?aspirin 81 MG EC tablet Take 1 tablet by mouth daily. 12/02/21   [name], NP  ?atorvastatin (LIPITOR) 20 MG tablet Take 1 tablet (20 mg total) by mouth daily. 10/02/21 03/01/22  [name], NP  ?Blood Glucose Monitoring Suppl (ACCU-CHEK GUIDE) w/Device KIT Check blood glucose level by fingerstick 3-4 times  per day. 07/04/21   Charlott Rakes, MD  ?chlorhexidine (PERIDEX) 0.12 % solution Rinse between teeth using irrigation syringe (15 mL) 2-3 times a day or as needed for gum irritation. 09/12/21   Sandi Mariscal B, DMD  ?Continuous Blood Gluc Receiver (FREESTYLE LIBRE 2 READER) DEVI Use as instructed. Check blood glucose level by fingerstick 4-5 per day. E11.65 12/10/21   [name], NP  ?Continuous Blood Gluc Sensor (FREESTYLE LIBRE 14 DAY SENSOR) MISC Use as instructed. Check blood glucose level by fingerstick 4-5 per day. E11.65 12/10/21   [name], NP  ?diphenoxylate-atropine (LOMOTIL) 2.5-0.025 MG tablet Take 1 tablet by mouth 4 (four) times daily as needed for diarrhea or loose stools. 10/08/21   [name], NP  ?DULoxetine (CYMBALTA) 60 MG capsule Take 1 capsule (60 mg total) by mouth daily. 09/10/21 03/01/22  [name], NP  ?EPINEPHrine 0.3 mg/0.3 mL IJ SOAJ injection Inject 0.3 mg into the muscle once as needed for anaphylaxis. 06/11/20   [provider]  ?fluconazole (DIFLUCAN) 200 MG tablet Take by mouth. 12/20/21 01/03/22  [provider]  ?folic acid (FOLVITE) 1 MG tablet Take 1 tablet (1 mg total) by mouth in the morning. 03/28/21   Rai, Ripudeep K, MD  ?glucose blood (ACCU-CHEK GUIDE) test strip Check blood glucose level by fingerstick 3 - 4 times per day. 07/04/21  Charlott Rakes, MD  ?Insulin NPH, Human,, Isophane, (HUMULIN N KWIKPEN) 100 UNIT/ML Kiwkpen Inject 35 Units into the skin every morning. 09/03/21   Renato Shin, MD  ?Insulin Pen Needle (TRUEPLUS PEN NEEDLES) 32G X 4 MM MISC Use as instructed. Inject into the skin twice daily 03/04/21   [name], NP  ?Insulin Pen Needle 32G X 4 MM MISC Use as directed with humulin kwikpen. 12/02/21     ?Insulin Syringe-Needle U-100 (TRUEPLUS INSULIN SYRINGE) 31G X 5/16" 0.3 ML MISC Use to inject insulin once daily. 07/31/21   Charlott Rakes, MD  ?lidocaine-prilocaine (EMLA) cream Apply 1 application topically as directed  as needed. 08/07/21   Lincoln Brigham, PA-C  ?lipase/protease/amylase (CREON) 36000 UNITS CPEP capsule Take 2 capsules (72,000 Units total) by mouth 3 (three) times daily with meals. May also take 1 capsule (36,000 Units total) as needed (with snacks). 04/07/21   [name], NP  ?losartan (COZAAR) 50 MG tablet Take 1 tablet (50 mg total) by mouth daily. 05/19/21 12/31/21  [name], NP  ?metoCLOPramide (REGLAN) 5 MG tablet Take 1 tablet (5 mg total) by mouth 4 (four) times daily -  before meals and at bedtime. 05/08/21 07/04/21  Hosie Poisson, MD  ?mirtazapine (REMERON) 30 MG tablet Take 1 tablet (30 mg total) by mouth at bedtime. 07/04/21 02/05/22  Charlott Rakes, MD  ?moxifloxacin (AVELOX) 400 MG tablet Take 400 mg by mouth daily. For 2 weeks 12/19/21   [provider]  ?Multiple Vitamins-Minerals (THEREMS-M) TABS Take 1 tablet by mouth in the morning. 07/04/21   [name], NP  ?nicotine (NICODERM CQ - DOSED IN MG/24 HOURS) 14 mg/24hr patch Apply one patch daily x 12 weeks. 10/08/21     ?ondansetron (ZOFRAN) 8 MG tablet Take 1 tablet (8 mg total) by mouth 2 (two) times daily as needed. Start on day 3 after chemotherapy. 08/07/21   Lincoln Brigham, PA-C  ?oxyCODONE (OXY IR/ROXICODONE) 5 MG immediate release tablet Take 1-2 tablets (5-10 mg total) by mouth every 6 (six) hours as needed for severe pain. 12/10/21   Orson Slick, MD  ?oxyCODONE ER [facility] ER) 9 MG C12A Take 1 capsule by mouth every 12 hours. 12/22/21   Orson Slick, MD  ?pantoprazole (PROTONIX) 40 MG tablet Take 1 tablet (40 mg total) by mouth daily. 12/02/21 03/02/22  [name], NP  ?polyethylene glycol powder (GLYCOLAX/MIRALAX) 17 GM/SCOOP powder Take 17 g by mouth at bedtime. 05/08/21   Hosie Poisson, MD  ?prochlorperazine (COMPAZINE) 10 MG tablet Take 1 tablet (10 mg total) by mouth every 6 (six) hours as needed for nausea or vomiting. 08/07/21   Lincoln Brigham, PA-C  ?Syringe, Disposable, 3 ML MISC Use As Directed 07/31/21    Renato Shin, MD  ?varenicline (APO-VARENICLINE) 1 MG tablet Take 1 tablet by mouth once daily in the morning with food 08/26/21     ?   ? ?Allergies    ?Bee venom and Lactose intolerance (gi)   ? ?Review of Systems   ?Review of Systems  ?Gastrointestinal:  Positive for abdominal pain.  ? ?Physical Exam ?Updated Vital Signs ?BP (!) 146/84   Pulse 88   Temp 98.4 ?F (36.9 ?C) (Oral)   Resp 15   Ht _0  (1.803 m)   Wt 59 kg   SpO2 97%   BMI 18.13 kg/m?  ?Physical Exam ?Vitals and nursing note reviewed.  ?Constitutional:   ?   General: He is  not in acute distress. ?   Appearance: He is well-developed.  ?HENT:  ?   Head: Normocephalic and atraumatic.  ?Eyes:  ?   Conjunctiva/sclera: Conjunctivae normal.  ?Cardiovascular:  ?   Rate and Rhythm: Normal rate and regular rhythm.  ?   Heart sounds: Normal heart sounds. No murmur heard. ?Pulmonary:  ?   Effort: Pulmonary effort is normal. No respiratory distress.  ?   Breath sounds: Normal breath sounds.  ?Abdominal:  ?   Palpations: Abdomen is soft.  ?   Tenderness: There is abdominal tenderness in the right upper quadrant.  ?   Comments: Surgical site in the midline appears clean dry and intact, drain site in the right upper quadrant appears clean dry and intact without any purulent drainage  ?Musculoskeletal:     ?   General: No swelling.  ?   Cervical back: Neck supple.  ?Skin: ?   General: Skin is warm and dry.  ?   Capillary Refill: Capillary refill takes less than 2 seconds.  ?Neurological:  ?   Mental Status: He is alert.  ?Psychiatric:     ?   Mood and Affect: Mood normal.  ? ? ?ED Results / Procedures / Treatments   ?Labs ?(all labs ordered are listed, but only abnormal results are displayed) ?Labs Reviewed  ?CBC WITH DIFFERENTIAL/PLATELET - Abnormal; Notable for the following components:  ?    Result Value  ? WBC 15.3 (*)   ? RBC 4.00 (*)   ? Hemoglobin 11.5 (*)   ? HCT 35.0 (*)   ? Neutro Abs 10.8 (*)   ? Monocytes Absolute 1.8 (*)   ? Abs Immature  Granulocytes 0.11 (*)   ? All other components within normal limits  ?COMPREHENSIVE METABOLIC PANEL - Abnormal; Notable for the following components:  ? Sodium 126 (*)   ? Chloride 91 (*)   ? CO2 19 (*)   ? Glucose

## 2021-12-25 NOTE — Progress Notes (Signed)
Pharmacy Note  ? ?A consult was received from an ED physician for Zosyn per pharmacy dosing. ?   ?The patient's profile has been reviewed for ht/wt/allergies/indication/available labs.   ? ?A one time order has been placed for Zosyn 3.375 gr IV x1 .   ? ?Further antibiotics/pharmacy consults should be ordered by admitting physician if indicated.       ?                ?Thank you, ? ?Royetta Asal, PharmD, BCPS ?12/25/2021 11:01 AM ? ?

## 2021-12-25 NOTE — ED Triage Notes (Signed)
Pt BIB [facility] from homew. Patient states he had whipple surgery 2 months ago and has been experiencing severe abdominal pain every since. Patient stastes he was recently seen at [facility] to have his abdomen drained. Patient states the pain is radiatin ginto his chest.  ? ?Vital signs were:  ?146/74 ?125- HR ?98% RA ?274-CBG ?

## 2021-12-25 NOTE — ED Notes (Signed)
Blood cultures were obtained prior to starting zosyn. ?

## 2021-12-29 ENCOUNTER — Encounter (HOSPITAL_BASED_OUTPATIENT_CLINIC_OR_DEPARTMENT_OTHER): Payer: Medicaid Other | Admitting: General Surgery

## 2021-12-29 ENCOUNTER — Other Ambulatory Visit (HOSPITAL_COMMUNITY): Payer: Self-pay

## 2021-12-30 ENCOUNTER — Encounter: Payer: Self-pay | Admitting: Hematology and Oncology

## 2021-12-30 ENCOUNTER — Other Ambulatory Visit (HOSPITAL_COMMUNITY): Payer: Self-pay

## 2021-12-30 ENCOUNTER — Other Ambulatory Visit: Payer: Self-pay | Admitting: Hematology and Oncology

## 2021-12-30 MED ORDER — OXYCODONE HCL 5 MG PO TABS
5.0000 mg | ORAL_TABLET | Freq: Four times a day (QID) | ORAL | 0 refills | Status: DC | PRN
  Filled 2021-12-30: qty 120, 15d supply, fill #0

## 2021-12-31 ENCOUNTER — Ambulatory Visit: Payer: Medicaid Other | Admitting: Endocrinology

## 2021-12-31 ENCOUNTER — Other Ambulatory Visit (HOSPITAL_COMMUNITY): Payer: Self-pay

## 2022-01-01 ENCOUNTER — Other Ambulatory Visit: Payer: Self-pay | Admitting: Nurse Practitioner

## 2022-01-01 LAB — CULTURE, BLOOD (ROUTINE X 2)
Culture: NO GROWTH
Culture: NO GROWTH
Special Requests: ADEQUATE
Special Requests: ADEQUATE

## 2022-01-02 ENCOUNTER — Other Ambulatory Visit: Payer: Self-pay | Admitting: Nurse Practitioner

## 2022-01-05 ENCOUNTER — Observation Stay (HOSPITAL_BASED_OUTPATIENT_CLINIC_OR_DEPARTMENT_OTHER): Payer: Medicaid Other

## 2022-01-05 ENCOUNTER — Emergency Department (HOSPITAL_COMMUNITY): Payer: Medicaid Other

## 2022-01-05 ENCOUNTER — Other Ambulatory Visit: Payer: Self-pay

## 2022-01-05 ENCOUNTER — Observation Stay (HOSPITAL_COMMUNITY)
Admission: EM | Admit: 2022-01-05 | Discharge: 2022-01-07 | Disposition: A | Discharge status: Home / Self Care | Payer: Medicaid Other | Attending: Family Medicine | Admitting: Family Medicine

## 2022-01-05 ENCOUNTER — Encounter (HOSPITAL_BASED_OUTPATIENT_CLINIC_OR_DEPARTMENT_OTHER): Payer: Medicaid Other | Attending: General Surgery | Admitting: General Surgery

## 2022-01-05 ENCOUNTER — Encounter (HOSPITAL_COMMUNITY): Payer: Self-pay | Admitting: Emergency Medicine

## 2022-01-05 DIAGNOSIS — L97522 Non-pressure chronic ulcer of other part of left foot with fat layer exposed: Secondary | ICD-10-CM | POA: Diagnosis not present

## 2022-01-05 DIAGNOSIS — J9601 Acute respiratory failure with hypoxia: Principal | ICD-10-CM | POA: Diagnosis present

## 2022-01-05 DIAGNOSIS — R0602 Shortness of breath: Secondary | ICD-10-CM | POA: Diagnosis present

## 2022-01-05 DIAGNOSIS — Z7982 Long term (current) use of aspirin: Secondary | ICD-10-CM | POA: Insufficient documentation

## 2022-01-05 DIAGNOSIS — I10 Essential (primary) hypertension: Secondary | ICD-10-CM | POA: Diagnosis not present

## 2022-01-05 DIAGNOSIS — R1900 Intra-abdominal and pelvic swelling, mass and lump, unspecified site: Secondary | ICD-10-CM | POA: Insufficient documentation

## 2022-01-05 DIAGNOSIS — E1142 Type 2 diabetes mellitus with diabetic polyneuropathy: Secondary | ICD-10-CM | POA: Diagnosis present

## 2022-01-05 DIAGNOSIS — R7401 Elevation of levels of liver transaminase levels: Secondary | ICD-10-CM | POA: Diagnosis not present

## 2022-01-05 DIAGNOSIS — F17210 Nicotine dependence, cigarettes, uncomplicated: Secondary | ICD-10-CM | POA: Insufficient documentation

## 2022-01-05 DIAGNOSIS — E11621 Type 2 diabetes mellitus with foot ulcer: Secondary | ICD-10-CM | POA: Insufficient documentation

## 2022-01-05 DIAGNOSIS — E1110 Type 2 diabetes mellitus with ketoacidosis without coma: Secondary | ICD-10-CM | POA: Diagnosis not present

## 2022-01-05 DIAGNOSIS — Z79899 Other long term (current) drug therapy: Secondary | ICD-10-CM | POA: Insufficient documentation

## 2022-01-05 DIAGNOSIS — Z794 Long term (current) use of insulin: Secondary | ICD-10-CM | POA: Diagnosis not present

## 2022-01-05 DIAGNOSIS — Z7984 Long term (current) use of oral hypoglycemic drugs: Secondary | ICD-10-CM | POA: Insufficient documentation

## 2022-01-05 DIAGNOSIS — E1140 Type 2 diabetes mellitus with diabetic neuropathy, unspecified: Secondary | ICD-10-CM | POA: Diagnosis not present

## 2022-01-05 DIAGNOSIS — L89152 Pressure ulcer of sacral region, stage 2: Secondary | ICD-10-CM | POA: Diagnosis not present

## 2022-01-05 DIAGNOSIS — Z20822 Contact with and (suspected) exposure to covid-19: Secondary | ICD-10-CM | POA: Diagnosis not present

## 2022-01-05 DIAGNOSIS — L8990 Pressure ulcer of unspecified site, unspecified stage: Secondary | ICD-10-CM | POA: Insufficient documentation

## 2022-01-05 DIAGNOSIS — E1169 Type 2 diabetes mellitus with other specified complication: Secondary | ICD-10-CM | POA: Diagnosis present

## 2022-01-05 DIAGNOSIS — E43 Unspecified severe protein-calorie malnutrition: Secondary | ICD-10-CM | POA: Diagnosis not present

## 2022-01-05 DIAGNOSIS — I3139 Other pericardial effusion (noninflammatory): Secondary | ICD-10-CM | POA: Diagnosis not present

## 2022-01-05 DIAGNOSIS — Z955 Presence of coronary angioplasty implant and graft: Secondary | ICD-10-CM | POA: Diagnosis not present

## 2022-01-05 DIAGNOSIS — E785 Hyperlipidemia, unspecified: Secondary | ICD-10-CM | POA: Diagnosis not present

## 2022-01-05 DIAGNOSIS — R7989 Other specified abnormal findings of blood chemistry: Secondary | ICD-10-CM | POA: Diagnosis present

## 2022-01-05 DIAGNOSIS — I309 Acute pericarditis, unspecified: Secondary | ICD-10-CM

## 2022-01-05 DIAGNOSIS — R0902 Hypoxemia: Secondary | ICD-10-CM

## 2022-01-05 DIAGNOSIS — Z8507 Personal history of malignant neoplasm of pancreas: Secondary | ICD-10-CM | POA: Diagnosis not present

## 2022-01-05 DIAGNOSIS — R188 Other ascites: Secondary | ICD-10-CM | POA: Diagnosis present

## 2022-01-05 DIAGNOSIS — F17200 Nicotine dependence, unspecified, uncomplicated: Secondary | ICD-10-CM | POA: Diagnosis present

## 2022-01-05 LAB — CBC WITH DIFFERENTIAL/PLATELET
Abs Immature Granulocytes: 0.09 10*3/uL — ABNORMAL HIGH (ref 0.00–0.07)
Basophils Absolute: 0.1 10*3/uL (ref 0.0–0.1)
Basophils Relative: 1 %
Eosinophils Absolute: 0.1 10*3/uL (ref 0.0–0.5)
Eosinophils Relative: 1 %
HCT: 33.8 % — ABNORMAL LOW (ref 39.0–52.0)
Hemoglobin: 11 g/dL — ABNORMAL LOW (ref 13.0–17.0)
Immature Granulocytes: 1 %
Lymphocytes Relative: 8 %
Lymphs Abs: 1 10*3/uL (ref 0.7–4.0)
MCH: 28.6 pg (ref 26.0–34.0)
MCHC: 32.5 g/dL (ref 30.0–36.0)
MCV: 87.8 fL (ref 80.0–100.0)
Monocytes Absolute: 1.4 10*3/uL — ABNORMAL HIGH (ref 0.1–1.0)
Monocytes Relative: 12 %
Neutro Abs: 9.4 10*3/uL — ABNORMAL HIGH (ref 1.7–7.7)
Neutrophils Relative %: 77 %
Platelets: 338 10*3/uL (ref 150–400)
RBC: 3.85 MIL/uL — ABNORMAL LOW (ref 4.22–5.81)
RDW: 14.9 % (ref 11.5–15.5)
WBC: 11.9 10*3/uL — ABNORMAL HIGH (ref 4.0–10.5)
nRBC: 0 % (ref 0.0–0.2)

## 2022-01-05 LAB — COMPREHENSIVE METABOLIC PANEL
ALT: 17 U/L (ref 0–44)
AST: 27 U/L (ref 15–41)
Albumin: 3.1 g/dL — ABNORMAL LOW (ref 3.5–5.0)
Alkaline Phosphatase: 187 U/L — ABNORMAL HIGH (ref 38–126)
Anion gap: 19 — ABNORMAL HIGH (ref 5–15)
BUN: 7 mg/dL (ref 6–20)
CO2: 20 mmol/L — ABNORMAL LOW (ref 22–32)
Calcium: 8.9 mg/dL (ref 8.9–10.3)
Chloride: 91 mmol/L — ABNORMAL LOW (ref 98–111)
Creatinine, Ser: 0.59 mg/dL — ABNORMAL LOW (ref 0.61–1.24)
GFR, Estimated: >60 mL/min (ref >60)
Glucose, Bld: 295 mg/dL — ABNORMAL HIGH (ref 70–99)
Potassium: 4 mmol/L (ref 3.5–5.1)
Sodium: 130 mmol/L — ABNORMAL LOW (ref 135–145)
Total Bilirubin: 1.6 mg/dL — ABNORMAL HIGH (ref 0.3–1.2)
Total Protein: 8 g/dL (ref 6.5–8.1)

## 2022-01-05 LAB — GLUCOSE, CAPILLARY
Glucose-Capillary: 432 mg/dL — ABNORMAL HIGH (ref 70–99)
Glucose-Capillary: 321 mg/dL — ABNORMAL HIGH (ref 70–99)
Glucose-Capillary: 87 mg/dL (ref 70–99)

## 2022-01-05 LAB — URINALYSIS, ROUTINE W REFLEX MICROSCOPIC
Bacteria, UA: NONE SEEN
Bilirubin Urine: NEGATIVE
Glucose, UA: >=500 mg/dL — AB
Hgb urine dipstick: NEGATIVE
Ketones, ur: 80 mg/dL — AB
Leukocytes,Ua: NEGATIVE
Nitrite: NEGATIVE
Protein, ur: NEGATIVE mg/dL
Specific Gravity, Urine: >1.046 — ABNORMAL HIGH (ref 1.005–1.030)
pH: 6 (ref 5.0–8.0)

## 2022-01-05 LAB — RESP PANEL BY RT-PCR (FLU A&B, COVID) ARPGX2
Influenza A by PCR: NEGATIVE
Influenza B by PCR: NEGATIVE
SARS Coronavirus 2 by RT PCR: NEGATIVE

## 2022-01-05 LAB — ECHOCARDIOGRAM COMPLETE
Area-P 1/2: 4.89 cm2
Calc EF: 72 %
S' Lateral: 1.9 cm
Single Plane A2C EF: 70.8 %
Single Plane A4C EF: 75.4 %

## 2022-01-05 LAB — LACTIC ACID, PLASMA
Lactic Acid, Venous: 1.1 mmol/L (ref 0.5–1.9)
Lactic Acid, Venous: 0.8 mmol/L (ref 0.5–1.9)

## 2022-01-05 LAB — BLOOD GAS, VENOUS
Acid-base deficit: 0.2 mmol/L (ref 0.0–2.0)
Bicarbonate: 24.8 mmol/L (ref 20.0–28.0)
O2 Saturation: 58.7 %
Patient temperature: 37
pCO2, Ven: 41 mmHg — ABNORMAL LOW (ref 44–60)
pH, Ven: 7.39 (ref 7.25–7.43)
pO2, Ven: 32 mmHg (ref 32–45)

## 2022-01-05 LAB — BETA-HYDROXYBUTYRIC ACID: Beta-Hydroxybutyric Acid: 3.73 mmol/L — ABNORMAL HIGH (ref 0.05–0.27)

## 2022-01-05 LAB — BRAIN NATRIURETIC PEPTIDE: B Natriuretic Peptide: 54.1 pg/mL (ref 0.0–100.0)

## 2022-01-05 LAB — TROPONIN I (HIGH SENSITIVITY)
Troponin I (High Sensitivity): 13 ng/L (ref <18)
Troponin I (High Sensitivity): 12 ng/L (ref <18)

## 2022-01-05 LAB — LIPASE, BLOOD: Lipase: 17 U/L (ref 11–51)

## 2022-01-05 MED ORDER — ALBUTEROL SULFATE HFA 108 (90 BASE) MCG/ACT IN AERS: 2.0000 | INHALATION_SPRAY | RESPIRATORY_TRACT | Status: DC | PRN

## 2022-01-05 MED ORDER — INSULIN NPH (HUMAN) (ISOPHANE) 100 UNIT/ML ~~LOC~~ SUSP
20.0000 [IU] | Freq: Once | SUBCUTANEOUS | Status: AC
Start: 2022-01-05 — End: 2022-01-05
  Administered 2022-01-05: 20 [IU] via SUBCUTANEOUS
  Filled 2022-01-05: qty 10

## 2022-01-05 MED ORDER — CHLORHEXIDINE GLUCONATE CLOTH 2 % EX PADS
6.0000 | MEDICATED_PAD | Freq: Every day | CUTANEOUS | Status: DC
  Administered 2022-01-06 – 2022-01-07 (×2): 6 via TOPICAL

## 2022-01-05 MED ORDER — PANTOPRAZOLE SODIUM 40 MG PO TBEC
40.0000 mg | DELAYED_RELEASE_TABLET | Freq: Every day | ORAL | Status: DC
  Administered 2022-01-05 – 2022-01-07 (×3): 40 mg via ORAL
  Filled 2022-01-05 (×3): qty 1

## 2022-01-05 MED ORDER — ACETAMINOPHEN 325 MG PO TABS
650.0000 mg | ORAL_TABLET | Freq: Four times a day (QID) | ORAL | Status: DC | PRN
  Administered 2022-01-06: 650 mg via ORAL
  Filled 2022-01-05: qty 2

## 2022-01-05 MED ORDER — MIRTAZAPINE 15 MG PO TABS
30.0000 mg | ORAL_TABLET | Freq: Every day | ORAL | Status: DC
Start: 2022-01-05 — End: 2022-01-07
  Administered 2022-01-05 – 2022-01-06 (×2): 30 mg via ORAL
  Filled 2022-01-05 (×2): qty 2

## 2022-01-05 MED ORDER — SODIUM CHLORIDE (PF) 0.9 % IJ SOLN
INTRAMUSCULAR | Status: AC
  Filled 2022-01-05: qty 50

## 2022-01-05 MED ORDER — INSULIN ASPART 100 UNIT/ML IJ SOLN
15.0000 [IU] | Freq: Once | INTRAMUSCULAR | Status: AC
  Administered 2022-01-05: 15 [IU] via SUBCUTANEOUS

## 2022-01-05 MED ORDER — ENSURE ENLIVE PO LIQD
237.0000 mL | Freq: Two times a day (BID) | ORAL | Status: DC
  Administered 2022-01-06: 237 mL via ORAL

## 2022-01-05 MED ORDER — LACTATED RINGERS IV BOLUS
1000.0000 mL | Freq: Once | INTRAVENOUS | Status: AC
  Administered 2022-01-05: 1000 mL via INTRAVENOUS

## 2022-01-05 MED ORDER — SODIUM CHLORIDE 0.9 % IV BOLUS
500.0000 mL | Freq: Once | INTRAVENOUS | Status: AC
  Administered 2022-01-05: 500 mL via INTRAVENOUS

## 2022-01-05 MED ORDER — LACTATED RINGERS IV SOLN: INTRAVENOUS | Status: AC

## 2022-01-05 MED ORDER — BUDESONIDE 0.5 MG/2ML IN SUSP
0.5000 mg | Freq: Two times a day (BID) | RESPIRATORY_TRACT | Status: DC
Start: 2022-01-05 — End: 2022-01-07
  Administered 2022-01-05 – 2022-01-07 (×4): 0.5 mg via RESPIRATORY_TRACT
  Filled 2022-01-05 (×4): qty 2

## 2022-01-05 MED ORDER — AMOXICILLIN-POT CLAVULANATE 875-125 MG PO TABS
1.0000 | ORAL_TABLET | Freq: Two times a day (BID) | ORAL | Status: DC
  Administered 2022-01-05 – 2022-01-07 (×4): 1 via ORAL
  Filled 2022-01-05 (×4): qty 1

## 2022-01-05 MED ORDER — ASPIRIN EC 81 MG PO TBEC
81.0000 mg | DELAYED_RELEASE_TABLET | Freq: Every day | ORAL | Status: DC
  Administered 2022-01-05 – 2022-01-07 (×3): 81 mg via ORAL
  Filled 2022-01-05 (×3): qty 1

## 2022-01-05 MED ORDER — PANCRELIPASE (LIP-PROT-AMYL) 12000-38000 UNITS PO CPEP
24000.0000 [IU] | ORAL_CAPSULE | Freq: Three times a day (TID) | ORAL | Status: DC
  Administered 2022-01-05 – 2022-01-07 (×6): 24000 [IU] via ORAL
  Filled 2022-01-05 (×6): qty 2

## 2022-01-05 MED ORDER — OXYCODONE HCL ER 10 MG PO T12A
10.0000 mg | EXTENDED_RELEASE_TABLET | Freq: Two times a day (BID) | ORAL | Status: DC
  Administered 2022-01-05 – 2022-01-07 (×4): 10 mg via ORAL
  Filled 2022-01-05 (×4): qty 1

## 2022-01-05 MED ORDER — INSULIN ASPART 100 UNIT/ML IJ SOLN
0.0000 [IU] | Freq: Three times a day (TID) | INTRAMUSCULAR | Status: DC
  Administered 2022-01-06: 8 [IU] via SUBCUTANEOUS
  Administered 2022-01-07 (×2): 5 [IU] via SUBCUTANEOUS
  Filled 2022-01-05: qty 0.15

## 2022-01-05 MED ORDER — INSULIN NPH (HUMAN) (ISOPHANE) 100 UNIT/ML ~~LOC~~ SUSP
35.0000 [IU] | Freq: Every day | SUBCUTANEOUS | Status: DC
  Filled 2022-01-05: qty 10

## 2022-01-05 MED ORDER — FENTANYL CITRATE PF 50 MCG/ML IJ SOSY
50.0000 ug | PREFILLED_SYRINGE | Freq: Once | INTRAMUSCULAR | Status: AC
  Administered 2022-01-05: 50 ug via INTRAVENOUS
  Filled 2022-01-05: qty 1

## 2022-01-05 MED ORDER — ONDANSETRON HCL 4 MG/2ML IJ SOLN: 4.0000 mg | Freq: Four times a day (QID) | INTRAMUSCULAR | Status: DC | PRN

## 2022-01-05 MED ORDER — ONDANSETRON HCL 4 MG PO TABS
4.0000 mg | ORAL_TABLET | Freq: Four times a day (QID) | ORAL | Status: DC | PRN
Start: 2022-01-05 — End: 2022-01-07

## 2022-01-05 MED ORDER — BUDESONIDE 0.25 MG/2ML IN SUSP: 0.2500 mg | Freq: Two times a day (BID) | RESPIRATORY_TRACT | Status: DC

## 2022-01-05 MED ORDER — DULOXETINE HCL 30 MG PO CPEP
60.0000 mg | ORAL_CAPSULE | Freq: Every day | ORAL | Status: DC
  Administered 2022-01-05 – 2022-01-07 (×3): 60 mg via ORAL
  Filled 2022-01-05 (×3): qty 2

## 2022-01-05 MED ORDER — HYDROMORPHONE HCL 1 MG/ML IJ SOLN
1.5000 mg | Freq: Once | INTRAMUSCULAR | Status: AC
  Administered 2022-01-05: 1.5 mg via INTRAVENOUS
  Filled 2022-01-05: qty 1.5

## 2022-01-05 MED ORDER — PANCRELIPASE (LIP-PROT-AMYL) 12000-38000 UNITS PO CPEP
12000.0000 [IU] | ORAL_CAPSULE | Freq: Three times a day (TID) | ORAL | Status: DC | PRN
Start: 2022-01-05 — End: 2022-01-07

## 2022-01-05 MED ORDER — FLUCONAZOLE 200 MG PO TABS
400.0000 mg | ORAL_TABLET | Freq: Every day | ORAL | Status: DC
  Administered 2022-01-05 – 2022-01-07 (×3): 400 mg via ORAL
  Filled 2022-01-05 (×4): qty 2

## 2022-01-05 MED ORDER — IOHEXOL 350 MG/ML SOLN
100.0000 mL | Freq: Once | INTRAVENOUS | Status: AC | PRN
  Administered 2022-01-05: 100 mL via INTRAVENOUS

## 2022-01-05 MED ORDER — POLYETHYLENE GLYCOL 3350 17 GM/SCOOP PO POWD
17.0000 g | Freq: Every day | ORAL | Status: DC | PRN
  Filled 2022-01-05: qty 255

## 2022-01-05 MED ORDER — ACETAMINOPHEN 650 MG RE SUPP
650.0000 mg | Freq: Four times a day (QID) | RECTAL | Status: DC | PRN
Start: 2022-01-05 — End: 2022-01-07

## 2022-01-05 MED ORDER — ONDANSETRON HCL 4 MG/2ML IJ SOLN
4.0000 mg | Freq: Once | INTRAMUSCULAR | Status: AC
  Administered 2022-01-05: 4 mg via INTRAVENOUS
  Filled 2022-01-05: qty 2

## 2022-01-05 MED ORDER — IOHEXOL 300 MG/ML  SOLN
75.0000 mL | Freq: Once | INTRAMUSCULAR | Status: AC | PRN
  Administered 2022-01-05: 75 mL via INTRAVENOUS

## 2022-01-05 MED ORDER — OXYCODONE HCL 5 MG PO TABS
5.0000 mg | ORAL_TABLET | Freq: Four times a day (QID) | ORAL | Status: DC | PRN
  Administered 2022-01-06 – 2022-01-07 (×4): 10 mg via ORAL
  Filled 2022-01-05 (×4): qty 2

## 2022-01-05 MED ORDER — ALBUTEROL SULFATE (2.5 MG/3ML) 0.083% IN NEBU: 2.5000 mg | INHALATION_SOLUTION | RESPIRATORY_TRACT | Status: DC | PRN

## 2022-01-05 NOTE — Progress Notes (Signed)
?  Echocardiogram ?2D Echocardiogram has been performed. ? ?[name] [name] ?01/05/2022, 4:12 PM ?
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
## 2022-01-05 NOTE — ED Triage Notes (Signed)
Pt reports SHOB since yesterday. Pt states he has whipple surgery in March and has had issues since. Pt reports fever at home of 101. Temp in triage 98.1.  ?

## 2022-01-05 NOTE — ED Provider Notes (Signed)
?[place] DEPT ?Provider Note ? ? ?CSN: [id number] ?Arrival date & time: 01/05/22  3810 ? ?  ? ?History ? ?Chief Complaint  ?Patient presents with  ? Shortness of Breath  ? ? ?[name] [name] is a 61 y.o. male. ? ?The history is provided by the patient and medical records. No language interpreter was used.  ?Shortness of Breath ?Severity:  Moderate ?Onset quality:  Gradual ?Duration:  1 week ?Timing:  Constant ?Progression:  Waxing and waning ?Chronicity:  New ?Relieved by:  Nothing ?Worsened by:  Exertion, deep breathing and coughing ?Ineffective treatments:  None tried ?Associated symptoms: abdominal pain, cough, fever and sputum production   ?Associated symptoms: no chest pain, no diaphoresis, no headaches, no neck pain, no rash, no vomiting and no wheezing   ?Risk factors: hx of cancer and recent surgery   ?Risk factors: no hx of PE/DVT   ? ?  ? ?Home Medications ?Prior to Admission medications   ?Medication Sig Start Date End Date Taking? Authorizing Provider  ?Accu-Chek Softclix Lancets lancets Check blood glucose level by fingerstick 3-4 times per day. 07/04/21   Charlott Rakes, MD  ?acetaminophen (TYLENOL) 325 MG tablet Take by mouth. 12/05/21   [provider]  ?albuterol (VENTOLIN HFA) 108 (90 Base) MCG/ACT inhaler Inhale 2 puffs into the lungs every 6 (six) hours as needed for wheezing or shortness of breath. 11/25/21   Orson Slick, MD  ?aspirin 81 MG EC tablet Take 1 tablet by mouth daily. 12/02/21   [name], NP  ?atorvastatin (LIPITOR) 20 MG tablet Take 1 tablet (20 mg total) by mouth daily. 10/02/21 03/01/22  [name], NP  ?Blood Glucose Monitoring Suppl (ACCU-CHEK GUIDE) w/Device KIT Check blood glucose level by fingerstick 3-4 times per day. 07/04/21   Charlott Rakes, MD  ?chlorhexidine (PERIDEX) 0.12 % solution Rinse between teeth using irrigation syringe (15 mL) 2-3 times a day or as needed for gum irritation. 09/12/21   Sandi Mariscal B, DMD   ?Continuous Blood Gluc Receiver (FREESTYLE LIBRE 2 READER) DEVI Use as instructed. Check blood glucose level by fingerstick 4-5 per day. E11.65 12/10/21   [name], NP  ?Continuous Blood Gluc Sensor (FREESTYLE LIBRE 14 DAY SENSOR) MISC Use as instructed, Check blood glucose level by fingerstick 4 - 5 times per day. 12/10/21   [name], NP  ?diphenoxylate-atropine (LOMOTIL) 2.5-0.025 MG tablet Take 1 tablet by mouth 4 (four) times daily as needed for diarrhea or loose stools. 10/08/21   [name], NP  ?DULoxetine (CYMBALTA) 60 MG capsule Take 1 capsule (60 mg total) by mouth daily. 09/10/21 03/01/22  [name], NP  ?EPINEPHrine 0.3 mg/0.3 mL IJ SOAJ injection Inject 0.3 mg into the muscle once as needed for anaphylaxis. 06/11/20   [provider]  ?folic acid (FOLVITE) 1 MG tablet Take 1 tablet (1 mg total) by mouth in the morning. 03/28/21   Rai, Ripudeep K, MD  ?glucose blood (ACCU-CHEK GUIDE) test strip Check blood glucose level by fingerstick 3 - 4 times per day. 07/04/21   Charlott Rakes, MD  ?Insulin NPH, Human,, Isophane, (HUMULIN N KWIKPEN) 100 UNIT/ML Kiwkpen Inject 35 Units into the skin every morning. 09/03/21   Renato Shin, MD  ?Insulin Pen Needle (TRUEPLUS PEN NEEDLES) 32G X 4 MM MISC Use as instructed. Inject into the skin twice daily 03/04/21   [name], NP  ?Insulin Pen Needle 32G X 4 MM MISC Use as directed with  humulin kwikpen. 12/02/21     ?Insulin Syringe-Needle U-100 (TRUEPLUS INSULIN SYRINGE) 31G X 5/16" 0.3 ML MISC Use to inject insulin once daily. 07/31/21   Charlott Rakes, MD  ?lidocaine-prilocaine (EMLA) cream Apply 1 application topically as directed as needed. 08/07/21   Lincoln Brigham, PA-C  ?lipase/protease/amylase (CREON) 36000 UNITS CPEP capsule Take 2 capsules (72,000 Units total) by mouth 3 (three) times daily with meals. May also take 1 capsule (36,000 Units total) as needed (with snacks). 04/07/21   [name], NP  ?losartan (COZAAR) 50 MG  tablet Take 1 tablet (50 mg total) by mouth daily. 05/19/21 12/31/21  [name], NP  ?metoCLOPramide (REGLAN) 5 MG tablet Take 1 tablet (5 mg total) by mouth 4 (four) times daily -  before meals and at bedtime. 05/08/21 07/04/21  Hosie Poisson, MD  ?mirtazapine (REMERON) 30 MG tablet Take 1 tablet (30 mg total) by mouth at bedtime. 07/04/21 02/05/22  Charlott Rakes, MD  ?moxifloxacin (AVELOX) 400 MG tablet Take 400 mg by mouth daily. For 2 weeks 12/19/21   [provider]  ?Multiple Vitamins-Minerals (THEREMS-M) TABS Take 1 tablet by mouth in the morning. 07/04/21   [name], NP  ?nicotine (NICODERM CQ - DOSED IN MG/24 HOURS) 14 mg/24hr patch Apply one patch daily x 12 weeks. 10/08/21     ?ondansetron (ZOFRAN) 8 MG tablet Take 1 tablet (8 mg total) by mouth 2 (two) times daily as needed. Start on day 3 after chemotherapy. 08/07/21   Lincoln Brigham, PA-C  ?oxyCODONE (OXY IR/ROXICODONE) 5 MG immediate release tablet Take 1-2 tablets (5-10 mg total) by mouth every 6 (six) hours as needed for severe pain. 12/30/21   Orson Slick, MD  ?oxyCODONE ER [facility]) 9 MG C12A Take 1 capsule by mouth every 12 hours. 12/22/21   Orson Slick, MD  ?pantoprazole (PROTONIX) 40 MG tablet Take 1 tablet (40 mg total) by mouth daily. 12/02/21 03/02/22  [name], NP  ?polyethylene glycol powder (GLYCOLAX/MIRALAX) 17 GM/SCOOP powder Take 17 g by mouth at bedtime. 05/08/21   Hosie Poisson, MD  ?prochlorperazine (COMPAZINE) 10 MG tablet Take 1 tablet (10 mg total) by mouth every 6 (six) hours as needed for nausea or vomiting. 08/07/21   Lincoln Brigham, PA-C  ?Syringe, Disposable, 3 ML MISC Use As Directed 07/31/21   Renato Shin, MD  ?varenicline (APO-VARENICLINE) 1 MG tablet Take 1 tablet by mouth once daily in the morning with food 08/26/21     ?   ? ?Allergies    ?Bee venom and Lactose intolerance (gi)   ? ?Review of Systems   ?Review of Systems  ?Constitutional:  Positive for chills, fatigue and fever. Negative for  diaphoresis.  ?HENT:  Negative for congestion.   ?Eyes:  Negative for visual disturbance.  ?Respiratory:  Positive for cough, sputum production and shortness of breath. Negative for chest tightness, wheezing and stridor.   ?Cardiovascular:  Negative for chest pain, palpitations and leg swelling.  ?Gastrointestinal:  Positive for abdominal pain. Negative for constipation, diarrhea, nausea and vomiting.  ?Genitourinary:  Negative for dysuria and flank pain.  ?Musculoskeletal:  Negative for back pain and neck pain.  ?Skin:  Negative for rash and wound.  ?Neurological:  Negative for light-headedness and headaches.  ?Psychiatric/Behavioral:  Negative for agitation and confusion.   ?All other systems reviewed and are negative. ? ?Physical Exam ?Updated Vital Signs ?BP 135/87 (BP Location: Left Arm)   Pulse (!) 121  Temp 98.1 ?F (36.7 ?C) (Oral)   Resp 18   SpO2 99%  ?Physical Exam ?Vitals and nursing note reviewed.  ?Constitutional:   ?   General: He is not in acute distress. ?   Appearance: He is well-developed. He is ill-appearing. He is not toxic-appearing or diaphoretic.  ?HENT:  ?   Head: Normocephalic and atraumatic.  ?Eyes:  ?   Extraocular Movements: Extraocular movements intact.  ?   Conjunctiva/sclera: Conjunctivae normal.  ?   Pupils: Pupils are equal, round, and reactive to light.  ?Cardiovascular:  ?   Rate and Rhythm: Regular rhythm. Tachycardia present.  ?   Heart sounds: No murmur heard. ?Pulmonary:  ?   Effort: No tachypnea or respiratory distress.  ?   Breath sounds: No stridor. Rhonchi present. No wheezing or rales.  ?Chest:  ?   Chest wall: Tenderness present.  ?Abdominal:  ?   Palpations: Abdomen is soft.  ?   Tenderness: There is abdominal tenderness in the right upper quadrant, epigastric area and left upper quadrant. There is no guarding or rebound.  ? ? ?Musculoskeletal:     ?   General: No swelling.  ?   Cervical back: Neck supple.  ?   Right lower leg: No tenderness.  ?   Left lower leg: No  tenderness.  ?Skin: ?   General: Skin is warm and dry.  ?   Capillary Refill: Capillary refill takes less than 2 seconds.  ?   Findings: No erythema.  ?Neurological:  ?   General: No focal deficit present.
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
## 2022-01-05 NOTE — Progress Notes (Signed)
Patient CBG is 432 at 1706. AMION page sent to Dr. Olevia Bowens for further instructions per sliding scale insulin order. I noted that patient anion gap is 19 and CO2 is 20. Called RRN [place] and he advised to wait for insulin orders give insulin and recheck CBG 1 hour after and inform him of results.  ?

## 2022-01-05 NOTE — ED Notes (Signed)
ED TO INPATIENT HANDOFF REPORT  Name/Age/Gender [name] [name] 61 y.o. male  Code Status Code Status History     Date Active Date Inactive Code Status Order ID Comments User Context   05/14/2021 1218 05/14/2021 2049 DNR [id number]  Edsel Petrin, DO Inpatient   05/12/2021 2130 05/14/2021 1218 Full Code [id number]  Joseph Art, DO Inpatient   05/04/2021 2144 05/08/2021 2014 Full Code [id number]  Angie Fava, DO ED   03/26/2021 1355 03/28/2021 2235 Full Code [id number]  Lucile Shutters, MD ED   11/13/2020 2127 11/26/2020 1317 DNR [id number]  Eduard Clos, MD ED   10/28/2020 1226 11/01/2020 2052 DNR [id number]  Jae Dire, MD ED   05/13/2020 0107 05/21/2020 1406 Full Code [id number]  Macon Large, MD ED   12/16/2017 1708 12/17/2017 1527 Full Code [id number]  Fayrene Helper, PA-C ED   02/15/2014 1650 02/16/2014 1928 Full Code [id number]  Zehr, Princella Pellegrini., PA-C Inpatient    Questions for Most Recent Historical Code Status (Order [id number])     Question Answer   In the event of cardiac or respiratory ARREST Do not call a "code blue"   In the event of cardiac or respiratory ARREST Do not perform Intubation, CPR, defibrillation or ACLS   In the event of cardiac or respiratory ARREST Use medication by any route, position, wound care, and other measures to relive pain and suffering. May use oxygen, suction and manual treatment of airway obstruction as needed for comfort.                Advance Directive Documentation    Flowsheet Row Most Recent Value  Type of Advance Directive Healthcare Power of Attorney, Living will  Pre-existing out of facility DNR order (yellow form or pink MOST form) --  "MOST" Form in Place? --       Home/SNF/Other Home  Chief Complaint Acute respiratory failure with hypoxia (HCC) [J96.01]  Level of Care/Admitting Diagnosis ED Disposition     ED Disposition  Admit   Condition  --   Comment  Hospital Area: [facility] [[id number]]  Level of Care: Telemetry [5]  Admit to tele based on following criteria: Monitor for Ischemic changes  May place patient in [facility] at [facility] or [facility] if equivalent level of care is available:: No  Covid Evaluation: Symptomatic Person Under Investigation (PUI) or recent exposure (last 10 days) *Testing Required*  Diagnosis: Acute respiratory failure with hypoxia [facility]) [[id number]]  Admitting Physician: Bobette Mo [[id number]]  Attending Physician: Bobette Mo [[id number]]          Medical History Past Medical History:  Diagnosis Date   Barrett's esophagus dx 2016   Bronchitis    Chronic hepatitis C without hepatic coma (HCC) 10/31/2014   Depression    Diabetes (HCC)    ED (erectile dysfunction)    GERD (gastroesophageal reflux disease)    Hepatitis C    Hiatal hernia 10/2014   3cm   Neuropathy    S/P transmetatarsal amputation of foot, 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    Allergies Allergies  Allergen Reactions   Bee Venom Anaphylaxis   Lactose Intolerance (Gi) Other (See Comments)    GI upset    IV Location/Drains/Wounds Patient Lines/Drains/Airways Status     Active Line/Drains/Airways     Name Placement date Placement time Site Days   Implanted Port Right Chest --  --  Chest  --   Peripheral IV 01/05/22 20 G 1" Anterior;Right Forearm 01/05/22  1059  Forearm  less than 1   GI Stent  03/27/21  1423  --  284   Incision (Closed) 11/15/20 Foot Left 11/15/20  0928  -- 416            Labs/Imaging Results for orders placed or performed during the hospital encounter of 01/05/22 (from the past 48 hour(s))  Urinalysis, Routine w reflex microscopic Urine, Clean Catch     Status: Abnormal   Collection Time: 01/05/22 10:11 AM  Result Value Ref Range   Color, Urine YELLOW YELLOW   APPearance CLEAR CLEAR   Specific Gravity, Urine >1.046 (H) 1.005 - 1.030   pH 6.0 5.0 - 8.0   Glucose, UA >=500 (A) NEGATIVE mg/dL   Hgb urine dipstick  NEGATIVE NEGATIVE   Bilirubin Urine NEGATIVE NEGATIVE   Ketones, ur 80 (A) NEGATIVE mg/dL   Protein, ur NEGATIVE NEGATIVE mg/dL   Nitrite NEGATIVE NEGATIVE   Leukocytes,Ua NEGATIVE NEGATIVE   RBC / HPF 0-5 0 - 5 RBC/hpf   WBC, UA 0-5 0 - 5 WBC/hpf   Bacteria, UA NONE SEEN NONE SEEN   Squamous Epithelial / LPF 0-5 0 - 5    Comment: Performed at [facility], [address], [place], [place] [address]  CBC with Differential     Status: Abnormal   Collection Time: 01/05/22 11:17 AM  Result Value Ref Range   WBC 11.9 (H) 4.0 - 10.5 K/uL   RBC 3.85 (L) 4.22 - 5.81 MIL/uL   Hemoglobin 11.0 (L) 13.0 - 17.0 g/dL   HCT 60.4 (L) 54.0 - 98.1 %   MCV 87.8 80.0 - 100.0 fL   MCH 28.6 26.0 - 34.0 pg   MCHC 32.5 30.0 - 36.0 g/dL   RDW 19.1 47.8 - 29.5 %   Platelets 338 150 - 400 K/uL   nRBC 0.0 0.0 - 0.2 %   Neutrophils Relative % 77 %   Neutro Abs 9.4 (H) 1.7 - 7.7 K/uL   Lymphocytes Relative 8 %   Lymphs Abs 1.0 0.7 - 4.0 K/uL   Monocytes Relative 12 %   Monocytes Absolute 1.4 (H) 0.1 - 1.0 K/uL   Eosinophils Relative 1 %   Eosinophils Absolute 0.1 0.0 - 0.5 K/uL   Basophils Relative 1 %   Basophils Absolute 0.1 0.0 - 0.1 K/uL   Immature Granulocytes 1 %   Abs Immature Granulocytes 0.09 (H) 0.00 - 0.07 K/uL    Comment: Performed at [facility], [address], [place], [place] [address]  Comprehensive metabolic panel     Status: Abnormal   Collection Time: 01/05/22 11:17 AM  Result Value Ref Range   Sodium 130 (L) 135 - 145 mmol/L   Potassium 4.0 3.5 - 5.1 mmol/L   Chloride 91 (L) 98 - 111 mmol/L   CO2 20 (L) 22 - 32 mmol/L   Glucose, Bld 295 (H) 70 - 99 mg/dL    Comment: Glucose reference range applies only to samples taken after fasting for at least 8 hours.   BUN 7 6 - 20 mg/dL   Creatinine, Ser 8.65 (L) 0.61 - 1.24 mg/dL   Calcium 8.9 8.9 - 78.4 mg/dL   Total Protein 8.0 6.5 - 8.1 g/dL   Albumin 3.1 (L) 3.5 - 5.0 g/dL   AST 27 15 - 41  U/L   ALT 17 0 - 44 U/L   Alkaline Phosphatase 187 (H) 38 - 126 U/L   Total Bilirubin 1.6 (H) 0.3 -  1.2 mg/dL   GFR, Estimated >16 >10 mL/min    Comment: (NOTE) Calculated using the CKD-EPI Creatinine Equation (2021)    Anion gap 19 (H) 5 - 15    Comment: Performed at [facility], [address], [place], [place] [address]  Lipase, blood     Status: None   Collection Time: 01/05/22 11:17 AM  Result Value Ref Range   Lipase 17 11 - 51 U/L    Comment: Performed at [facility], [address], [place], [place] [address]  Troponin I (High Sensitivity)     Status: None   Collection Time: 01/05/22 11:17 AM  Result Value Ref Range   Troponin I (High Sensitivity) 13 <18 ng/L    Comment: (NOTE) Elevated high sensitivity troponin I (hsTnI) values and significant  changes across serial measurements may suggest ACS but many other  chronic and acute conditions are known to elevate hsTnI results.  Refer to the "Links" section for chest pain algorithms and additional  guidance. Performed at [facility], [address], [place], [place] [address]   Lactic acid, plasma     Status: None   Collection Time: 01/05/22 11:19 AM  Result Value Ref Range   Lactic Acid, Venous 1.1 0.5 - 1.9 mmol/L    Comment: Performed at [facility], [address], [place], [place] [address]  Brain natriuretic peptide     Status: None   Collection Time: 01/05/22 11:19 AM  Result Value Ref Range   B Natriuretic Peptide 54.1 0.0 - 100.0 pg/mL    Comment: Performed at [facility] [address] [place], [place] [address]  Lactic acid, plasma     Status: None   Collection Time: 01/05/22 12:29 PM  Result Value Ref Range   Lactic Acid, Venous 0.8 0.5 - 1.9 mmol/L    Comment: Performed at [facility], [address], [place], [place] [address]  Troponin I (High Sensitivity)     Status: None    Collection Time: 01/05/22 12:29 PM  Result Value Ref Range   Troponin I (High Sensitivity) 12 <18 ng/L    Comment: (NOTE) Elevated high sensitivity troponin I (hsTnI) values and significant  changes across serial measurements may suggest ACS but many other  chronic and acute conditions are known to elevate hsTnI results.  Refer to the "Links" section for chest pain algorithms and additional  guidance. Performed at [facility], [address], [place], [place] [address]    *Note: Due to a large number of results and/or encounters for the requested time period, some results have not been displayed. A complete set of results can be found in Results Review.   DG Chest 2 View  Result Date: 01/05/2022 CLINICAL DATA:  61 year old male with shortness of breath and right lower chest pain since yesterday. Status post Whipple in March. EXAM: CHEST - 2 VIEW COMPARISON:  [facility] portable chest 11/02/2021 and earlier. FINDINGS: PA and lateral views. Larger lung volumes. Stable right chest power port. Normal cardiac size and mediastinal contours. Visualized tracheal air column is within normal limits. Both lungs appear clear. No pneumothorax or pleural effusion. Partially visible surgical clips in the right upper quadrant. Negative visible bowel gas. No acute osseous abnormality identified. IMPRESSION: No acute cardiopulmonary abnormality. Electronically Signed   By: Odessa Fleming M.D.   On: 01/05/2022 10:05   CT Angio Chest PE W and/or Wo Contrast  Result Date: 01/05/2022 CLINICAL  DATA:  Pulmonary embolism (PE) suspected, high prob Known cancer, recent procedure, tachycardia, tachypnea, worsened exertional shortness of breath. Rule out PE versus recurrent abscess below his diaphragm EXAM: CT ANGIOGRAPHY CHEST WITH CONTRAST TECHNIQUE: Multidetector CT imaging of the chest was performed using the standard protocol during bolus administration of intravenous contrast. Multiplanar CT image reconstructions  and MIPs were obtained to evaluate the vascular anatomy. RADIATION DOSE REDUCTION: This exam was performed according to the departmental dose-optimization program which includes automated exposure control, adjustment of the mA and/or kV according to patient size and/or use of iterative reconstruction technique. CONTRAST:  OMNIPAQUE IOHEXOL 350 MG/ML SOLN COMPARISON:  CT 12/25/2021. FINDINGS: Cardiovascular: Satisfactory opacification of the pulmonary arteries to the segmental level. No evidence of pulmonary embolism. Normal cardiac size.Small pericardial effusion, increased since 12/25/2021. Moderate atherosclerosis of the thoracic aorta. Chest port catheter tip terminates in the right atrium. Mediastinum/Nodes: No lymphadenopathy. Lungs/Pleura: No focal airspace disease. Left basilar scarring/linear atelectasis. No suspicious pulmonary nodules. Apical centrilobular and paraseptal emphysema. Upper Abdomen: No acute abnormality. Musculoskeletal: No chest wall abnormality. No acute or significant osseous findings. Review of the MIP images confirms the above findings. IMPRESSION: Increased, small pericardial effusion in comparison to prior. No pulmonary embolism or focal airspace consolidation. Apical centrilobular and paraseptal emphysema. Electronically Signed   By: Caprice Renshaw M.D.   On: 01/05/2022 12:53   CT ABDOMEN PELVIS W CONTRAST  Result Date: 01/05/2022 CLINICAL DATA:  Postop abdominal pain, evaluate for abscess. Pancreatic adenocarcinoma. * Tracking Code: BO * EXAM: CT ABDOMEN AND PELVIS WITH CONTRAST TECHNIQUE: Multidetector CT imaging of the abdomen and pelvis was performed using the standard protocol following bolus administration of intravenous contrast. RADIATION DOSE REDUCTION: This exam was performed according to the departmental dose-optimization program which includes automated exposure control, adjustment of the mA and/or kV according to patient size and/or use of iterative reconstruction  technique. CONTRAST:  75mL OMNIPAQUE IOHEXOL 300 MG/ML  SOLN COMPARISON:  CT chest 01/05/2022, CT abdomen pelvis 12/25/2021. FINDINGS: Lower chest: Minimal subsegmental atelectasis in the lung bases. Heart size normal. Small pericardial effusion with hyperattenuation of the pericardial border. Atherosclerotic calcification of the aorta and coronary arteries. No pleural fluid. Distal esophagus is grossly unremarkable. Hepatobiliary: Liver is slightly decreased in attenuation diffusely. Loculated perihepatic fluid collection has decreased in size along the anterolateral aspect of the liver, now with the largest pocket measuring 1.2 x 3.1 cm (2/31). Indwelling percutaneous drain. Cholecystectomy. No biliary ductal dilatation. Pancreas: Pancreaticoduodenectomy. Slight pancreatic ductal dilatation in the body, 4 mm, similar. Remainder of the gland is unremarkable. Spleen: Negative. Adrenals/Urinary Tract: Adrenal glands are unremarkable. Millimetric low-attenuation lesions in the right kidney are too small to characterize. No follow-up necessary. Kidneys are otherwise unremarkable. Ureters are decompressed. Bladder is grossly unremarkable. Stomach/Bowel: Stomach is unremarkable. Pancreaticoduodenectomy. Small bowel is otherwise unremarkable. Appendix is not readily appreciated. Fair amount of stool in the colon. Vascular/Lymphatic: Atherosclerotic calcification of the aorta. No pathologically enlarged lymph nodes. Reproductive: Prostate is visualized. Other: No free fluid.  Mesenteries and peritoneum are unremarkable. Musculoskeletal: Degenerative changes in the spine. No worrisome lytic or sclerotic lesions. Minimal grade 1 anterolisthesis of L4 on L5. IMPRESSION: 1. Small pericardial effusion. Hyperattenuation of the pericardium suggest associated pericarditis. 2. Decreasing loculated subcapsular fluid along the liver margin, with percutaneous drain in place. 3. Hepatic steatosis. 4. Fair amount of stool in the colon  is indicative of constipation. 5. Aortic atherosclerosis (ICD10-I70.0). Coronary artery calcification. Electronically Signed   By: Leanna Battles M.D.  On: 01/05/2022 13:50    Pending Labs Unresulted Labs (From admission, onward)     Start     Ordered   01/05/22 1519  Resp Panel by RT-PCR (Flu A&B, Covid) Nasopharyngeal Swab  (Tier 2 - Symptomatic/asymptomatic)  Once,   R        01/05/22 1518   01/05/22 1011  Blood culture (routine x 2)  BLOOD CULTURE X 2,   R (with STAT occurrences)      01/05/22 1011   01/05/22 1011  Urine Culture  Once,   URGENT       Question:  Indication  Answer:  Flank Pain   01/05/22 1011            Vitals/Pain Today's Vitals   01/05/22 1400 01/05/22 1430 01/05/22 1500 01/05/22 1530  BP: 124/73 138/84 124/83 122/75  Pulse: 92 94 98 97  Resp: 17 18 (!) 25 17  Temp:      TempSrc:      SpO2: 97% 98% 98% 98%  PainSc:        Isolation Precautions Airborne and Contact precautions  Medications Medications  albuterol (VENTOLIN HFA) 108 (90 Base) MCG/ACT inhaler 2 puff (has no administration in time range)  sodium chloride (PF) 0.9 % injection (has no administration in time range)  fentaNYL (SUBLIMAZE) injection 50 mcg (50 mcg Intravenous Given 01/05/22 1105)  sodium chloride 0.9 % bolus 500 mL (0 mLs Intravenous Stopped 01/05/22 1155)  iohexol (OMNIPAQUE) 350 MG/ML injection 100 mL (100 mLs Intravenous Contrast Given 01/05/22 1228)  iohexol (OMNIPAQUE) 300 MG/ML solution 75 mL (75 mLs Intravenous Contrast Given 01/05/22 1311)    Mobility walks with person assist

## 2022-01-05 NOTE — H&P (Signed)
?History and Physical  ? ? ?Patient: [name] [name] KZL:[id number] DOB: May 11, 1961 ?DOA: 01/05/2022 ?DOS: the patient was seen and examined on 01/05/2022 ?PCP: [name], NP  ?Patient coming from: Home ? ?Chief Complaint:  ?Chief Complaint  ?Patient presents with  ? Shortness of Breath  ? ?HPI: [name] [name] is a 61 y.o. male with medical history significant of Barrett's esophagus, bronchitis, chronic hep C, depression, type 2 diabetes, diabetic neuropathy, GERD, hiatal hernia, ED, partial left foot amputation, EtOH use, liver cirrhosis, male hypogonadism, headache diarrhea hemochromatosis, history of pneumonia, history encephalopathy, depression, aortic atherosclerosis, history of pancreatic cancer diagnosed last year, Whipple procedure in March of this year complicated by perihepatic fluid collection needing CT-guided drainage and augmenting followed by fluconazole 400 mg since last week.  ID after fluid grew Candida albicans, Candida glabrata E. coli and mixed flora who is coming to the emergency department due to fever, generalized weakness and shortness of breath since yesterday.  He has been afebrile here.  No chills or night sweats.  Occasionally nauseous, but no vomiting, diarrhea, melena or hematochezia.  No flank pain, dysuria, frequency or hematuria.  No chest pain, palpitations, diaphoresis, PND, orthopnea, or pitting edema of the lower extremities.  He has not been eating much since last week and stopped his NPH insulin last week. ? ?ED course: Initial vital signs were temperature 98.1 ?F, pulse 121, respiration 18, BP 135/87 mmHg O2 sat 99% on room air.  While in the ER, the patient became tachypneic and his O2 sat according to Dr. Sherry Ruffing decreased to 12s, but subsequently recovered and normal sat after several minutes on room air. ? ?Lab work: Urinalysis showed increase of specific gravity, glucosuria more than 500 mg/dL and ketonuria of 80 mg/dL.  CMP showed a sodium 130, potassium 4.0,  chloride 91 and CO2 20 mmol/L with an anion gap of 19.  Glucose to 95 mg/dL.  Renal function unremarkable.  LFTs show a total protein of 8.0 and albumin of 3.1 g/dL.  Transaminases were normal.  Alkaline phosphatase 187 units/L total bilirubin 1.6 mg/dL.  CBC showed a white count 11.9, hemoglobin 11.0 g/dL platelets 330.  Lactic acid was normal twice.  Troponin was normal twice.  Lipase unremarkable. ? ?Imaging: Two-view chest radiograph with no acute cardiopulmonary pathology.  CTA chest shows an increase, small pericardial effusion in comparison to prior.  No PE or focal airspace consolidation.  Apical centrilobular and paraseptal emphysema.  CT abdomen showed decrease in loculated subcapsular fluid along the liver margin with percutaneous drain in place.  Hepatic asteatosis.  Aortic atherosclerosis and coronary artery calcification. ? ?Review of Systems: As mentioned in the history of present illness. All other systems reviewed and are negative. ?Past Medical History:  ?Diagnosis Date  ? Barrett's esophagus dx 2016  ? Bronchitis   ? Chronic hepatitis C without hepatic coma ([place]) 10/31/2014  ? Depression   ? Diabetes ([place])   ? ED (erectile dysfunction)   ? GERD (gastroesophageal reflux disease)   ? Hepatitis C   ? Hiatal hernia 10/2014  ? 3cm  ? Neuropathy   ? S/P transmetatarsal amputation of foot, left ([place]) 11/28/2020  ? ?Past Surgical History:  ?Procedure Laterality Date  ? AMPUTATION Left 11/15/2020  ? Procedure: LEFT TRANSMETATARSALS AMPUTATION;  Surgeon: Newt Minion, MD;  Location: [facility];  Service: Orthopedics;  Laterality: Left;  ? APPENDECTOMY    ? BILIARY STENT PLACEMENT N/A 02/27/2021  ? Procedure: BILIARY STENT PLACEMENT;  Surgeon: Owens Loffler  P, MD;  Location: [facility];  Service: Endoscopy;  Laterality: N/A;  ? BILIARY STENT PLACEMENT N/A 03/27/2021  ? Procedure: BILIARY STENT PLACEMENT;  Surgeon: Jackquline Denmark, MD;  Location: [facility];  Service: Endoscopy;  Laterality: N/A;  ? BIOPSY   03/27/2021  ? Procedure: BIOPSY;  Surgeon: Jackquline Denmark, MD;  Location: [facility];  Service: Endoscopy;;  ? COLONOSCOPY N/A 02/16/2014  ? Procedure: COLONOSCOPY;  Surgeon: Gatha Mayer, MD;  Location: [facility];  Service: Endoscopy;  Laterality: N/A;  ? COLONOSCOPY    ? ENDOSCOPIC RETROGRADE CHOLANGIOPANCREATOGRAPHY (ERCP) WITH PROPOFOL N/A 02/27/2021  ? Procedure: ENDOSCOPIC RETROGRADE CHOLANGIOPANCREATOGRAPHY (ERCP) WITH PROPOFOL;  Surgeon: Milus Banister, MD;  Location: [facility];  Service: Endoscopy;  Laterality: N/A;  ? ERCP N/A 03/27/2021  ? Procedure: ENDOSCOPIC RETROGRADE CHOLANGIOPANCREATOGRAPHY (ERCP);  Surgeon: Jackquline Denmark, MD;  Location: [facility];  Service: Endoscopy;  Laterality: N/A;  ? ESOPHAGOGASTRODUODENOSCOPY (EGD) WITH PROPOFOL N/A 05/06/2021  ? Procedure: ESOPHAGOGASTRODUODENOSCOPY (EGD) WITH PROPOFOL;  Surgeon: Mauri Pole, MD;  Location: [facility];  Service: Endoscopy;  Laterality: N/A;  ? EUS N/A 02/27/2021  ? Procedure: UPPER ENDOSCOPIC ULTRASOUND (EUS) RADIAL;  Surgeon: Milus Banister, MD;  Location: [facility];  Service: Endoscopy;  Laterality: N/A;  ? FINE NEEDLE ASPIRATION N/A 02/27/2021  ? Procedure: FINE NEEDLE ASPIRATION (FNA) LINEAR;  Surgeon: Milus Banister, MD;  Location: [facility];  Service: Endoscopy;  Laterality: N/A;  ? IR IMAGING GUIDED PORT INSERTION  03/21/2021  ? SPHINCTEROTOMY  02/27/2021  ? Procedure: SPHINCTEROTOMY;  Surgeon: Milus Banister, MD;  Location: [facility];  Service: Endoscopy;;  ? STENT REMOVAL  03/27/2021  ? Procedure: STENT REMOVAL;  Surgeon: Jackquline Denmark, MD;  Location: [facility];  Service: Endoscopy;;  ? TONSILLECTOMY    ? ?Social History:  reports that he has been smoking cigarettes. He has a 17.50 pack-year smoking history. He has never used smokeless tobacco. He reports that he does not currently use alcohol. He reports that he does not use drugs. ? ?Allergies  ?Allergen Reactions  ? Bee Venom Anaphylaxis  ? Lactose  Intolerance (Gi) Other (See Comments)  ?  GI upset  ? ? ?Family History  ?Problem Relation Age of Onset  ? Breast cancer Mother   ? Stroke Father   ? Alcohol abuse Father   ? Heart disease Maternal Grandfather   ? Pancreatic cancer Paternal Grandmother   ? Diabetes Paternal Grandfather   ? Colon cancer Neg Hx   ? Stomach cancer Neg Hx   ? Rectal cancer Neg Hx   ? Esophageal cancer Neg Hx   ? ? ?Prior to Admission medications   ?Medication Sig Start Date End Date Taking? Authorizing Provider  ?acetaminophen (TYLENOL) 500 MG tablet Take 500 mg by mouth every 6 (six) hours as needed for moderate pain.   Yes [provider]  ?albuterol (VENTOLIN HFA) 108 (90 Base) MCG/ACT inhaler Inhale 2 puffs into the lungs every 6 (six) hours as needed for wheezing or shortness of breath. ?Patient taking differently: Inhale 1 puff into the lungs every 6 (six) hours as needed for shortness of breath. 11/25/21  Yes Orson Slick, MD  ?amoxicillin-clavulanate (AUGMENTIN) 875-125 MG tablet Take 1 tablet by mouth 2 (two) times daily. 12/27/21  Yes [provider]  ?aspirin 81 MG EC tablet Take 1 tablet by mouth daily. 12/02/21  Yes [name], NP  ?atorvastatin (LIPITOR) 20 MG tablet Take 1 tablet (20 mg total) by mouth daily. 10/02/21  03/01/22 Yes [name], NP  ?chlorhexidine (PERIDEX) 0.12 % solution Rinse between teeth using irrigation syringe (15 mL) 2-3 times a day or as needed for gum irritation. ?Patient taking differently: Use as directed 5 mLs in the mouth or throat daily as needed (for gum irriation). 09/12/21  Yes Owsley, Madison B, DMD  ?diphenoxylate-atropine (LOMOTIL) 2.5-0.025 MG tablet Take 1 tablet by mouth 4 (four) times daily as needed for diarrhea or loose stools. 10/08/21  Yes [name], NP  ?DULoxetine (CYMBALTA) 60 MG capsule Take 1 capsule (60 mg total) by mouth daily. 09/10/21 03/01/22 Yes [name], NP  ?folic acid (FOLVITE) 1 MG tablet Take 1 tablet (1 mg total) by mouth in  the morning. 03/28/21  Yes Rai, Ripudeep K, MD  ?Insulin NPH, Human,, Isophane, (HUMULIN N KWIKPEN) 100 UNIT/ML Kiwkpen Inject 35 Units into the skin every morning. ?Patient taking differently: Inject 35 Units into

## 2022-01-05 NOTE — ED Notes (Signed)
Pt in ct scan  

## 2022-01-06 ENCOUNTER — Ambulatory Visit: Payer: Medicaid Other | Admitting: Endocrinology

## 2022-01-06 DIAGNOSIS — I10 Essential (primary) hypertension: Secondary | ICD-10-CM

## 2022-01-06 DIAGNOSIS — I3139 Other pericardial effusion (noninflammatory): Secondary | ICD-10-CM

## 2022-01-06 DIAGNOSIS — J9601 Acute respiratory failure with hypoxia: Secondary | ICD-10-CM | POA: Diagnosis not present

## 2022-01-06 DIAGNOSIS — R188 Other ascites: Secondary | ICD-10-CM

## 2022-01-06 DIAGNOSIS — E1141 Type 2 diabetes mellitus with diabetic mononeuropathy: Secondary | ICD-10-CM

## 2022-01-06 DIAGNOSIS — E1169 Type 2 diabetes mellitus with other specified complication: Secondary | ICD-10-CM

## 2022-01-06 DIAGNOSIS — E785 Hyperlipidemia, unspecified: Secondary | ICD-10-CM

## 2022-01-06 LAB — HIV ANTIBODY (ROUTINE TESTING W REFLEX): HIV Screen 4th Generation wRfx: NONREACTIVE

## 2022-01-06 LAB — COMPREHENSIVE METABOLIC PANEL
ALT: 12 U/L (ref 0–44)
AST: 20 U/L (ref 15–41)
Albumin: 2.5 g/dL — ABNORMAL LOW (ref 3.5–5.0)
Alkaline Phosphatase: 145 U/L — ABNORMAL HIGH (ref 38–126)
Anion gap: 6 (ref 5–15)
BUN: 6 mg/dL (ref 6–20)
CO2: 31 mmol/L (ref 22–32)
Calcium: 8.9 mg/dL (ref 8.9–10.3)
Chloride: 96 mmol/L — ABNORMAL LOW (ref 98–111)
Creatinine, Ser: 0.37 mg/dL — ABNORMAL LOW (ref 0.61–1.24)
GFR, Estimated: >60 mL/min (ref >60)
Glucose, Bld: 102 mg/dL — ABNORMAL HIGH (ref 70–99)
Potassium: 3.4 mmol/L — ABNORMAL LOW (ref 3.5–5.1)
Sodium: 133 mmol/L — ABNORMAL LOW (ref 135–145)
Total Bilirubin: 0.4 mg/dL (ref 0.3–1.2)
Total Protein: 6.6 g/dL (ref 6.5–8.1)

## 2022-01-06 LAB — GLUCOSE, CAPILLARY
Glucose-Capillary: 118 mg/dL — ABNORMAL HIGH (ref 70–99)
Glucose-Capillary: 258 mg/dL — ABNORMAL HIGH (ref 70–99)
Glucose-Capillary: 75 mg/dL (ref 70–99)
Glucose-Capillary: 117 mg/dL — ABNORMAL HIGH (ref 70–99)

## 2022-01-06 LAB — URINE CULTURE: Culture: NO GROWTH

## 2022-01-06 LAB — CBC
HCT: 30.5 % — ABNORMAL LOW (ref 39.0–52.0)
Hemoglobin: 10.1 g/dL — ABNORMAL LOW (ref 13.0–17.0)
MCH: 28.7 pg (ref 26.0–34.0)
MCHC: 33.1 g/dL (ref 30.0–36.0)
MCV: 86.6 fL (ref 80.0–100.0)
Platelets: 261 10*3/uL (ref 150–400)
RBC: 3.52 MIL/uL — ABNORMAL LOW (ref 4.22–5.81)
RDW: 15.2 % (ref 11.5–15.5)
WBC: 9.4 10*3/uL (ref 4.0–10.5)
nRBC: 0 % (ref 0.0–0.2)

## 2022-01-06 LAB — SEDIMENTATION RATE: Sed Rate: 83 mm/hr — ABNORMAL HIGH (ref 0–16)

## 2022-01-06 LAB — BETA-HYDROXYBUTYRIC ACID: Beta-Hydroxybutyric Acid: 0.13 mmol/L (ref 0.05–0.27)

## 2022-01-06 MED ORDER — INSULIN NPH (HUMAN) (ISOPHANE) 100 UNIT/ML ~~LOC~~ SUSP
20.0000 [IU] | Freq: Every day | SUBCUTANEOUS | Status: DC
  Administered 2022-01-06 – 2022-01-07 (×2): 20 [IU] via SUBCUTANEOUS
  Filled 2022-01-06: qty 10

## 2022-01-06 MED ORDER — POTASSIUM CHLORIDE CRYS ER 20 MEQ PO TBCR
40.0000 meq | EXTENDED_RELEASE_TABLET | Freq: Two times a day (BID) | ORAL | Status: AC
Start: 2022-01-06 — End: 2022-01-06
  Administered 2022-01-06 (×2): 40 meq via ORAL
  Filled 2022-01-06 (×2): qty 2

## 2022-01-06 MED ORDER — ZINC OXIDE 12.8 % EX OINT
TOPICAL_OINTMENT | Freq: Two times a day (BID) | CUTANEOUS | Status: DC
  Filled 2022-01-06: qty 56.7

## 2022-01-06 MED ORDER — POTASSIUM CHLORIDE CRYS ER 20 MEQ PO TBCR: 40.0000 meq | EXTENDED_RELEASE_TABLET | Freq: Once | ORAL | Status: DC

## 2022-01-06 MED ORDER — POLYETHYLENE GLYCOL 3350 17 G PO PACK
17.0000 g | PACK | Freq: Two times a day (BID) | ORAL | Status: DC
  Administered 2022-01-06 (×2): 17 g via ORAL
  Filled 2022-01-06 (×3): qty 1

## 2022-01-06 MED ORDER — SODIUM CHLORIDE 0.9% FLUSH
10.0000 mL | Freq: Two times a day (BID) | INTRAVENOUS | Status: DC
  Administered 2022-01-07: 10 mL

## 2022-01-06 MED ORDER — INSULIN NPH (HUMAN) (ISOPHANE) 100 UNIT/ML ~~LOC~~ SUSP: 20.0000 [IU] | Freq: Every day | SUBCUTANEOUS | Status: DC

## 2022-01-06 NOTE — Progress Notes (Signed)
[facility] PROGRESS NOTE    Progress Note  [name] [name]  ZOX:[id number] DOB: October 06, 1960 DOA: 01/05/2022 PCP: Claiborne Rigg, NP     Brief Narrative:   [name] [name] is an 61 y.o. 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 coli comes into the emergency room due to fevers generalized weakness and shortness of breath since yesterday.  No PND. Satting 99% on room air pulse of 121, specific gravity is increased 500 ketones anion gap of 19 bilirubin 1.6 white count of 12 CT of the chest showed small pericardial effusion compared to prior no PE or airspace disease, did show emphysema. CT abdomen showed decrease loculated capsular fluid along the liver margin with percutaneous drains in place.     Assessment/Plan:   Acute respiratory failure with hypoxia (HCC) As emphysema with no signs of cough infection or COPD exacerbation. Satting greater 96% on room air.  Small pericardial effusion: Blood pressure stable unlikely the cause of his hypoxia. 2D echo showed an EF of 55% with grade 1 diastolic heart failure moderate pericardial effusion no evidence of tamponade. He was given a liter and a half in the ED. Pericardial effusion has progressively gotten bigger. Cardiology has been consulted.  High anion gap metabolic acidosis: Of unclear etiology lactic acid is low, with an anion gap of 19 on admission he was fluid resuscitated on admission now his anion gap is 6.  Abdominal fluid collection: Has no leukocytosis has remained afebrile. CT showed  decreased loculated subcapsular fluid along the liver margin with percutaneous drain in place.  Hypovolemic hyponatremia: Fluid resuscitated now improved.  Diabetes mellitus type 2 with ketosis: He had decreased oral intake so has held his insulin. He was resumed on his NPH and sliding scale blood glucose this morning is improved.  Tobacco use disorder: Counseling about more consideration.  Essential hypertension: Has been taken off his antihypertensive medication after significant weight loss.  Elevated LFTs: Continue to monitor.  Hyperlipidemia associated with diabetes mellitus: Not on a statin.  Diabetic neuropathy: Continue duloxetine.  Pressure injury of the skin stage IV present on admission RN Pressure Injury Documentation: Pressure Injury 01/05/22 Coccyx Mid Stage 1 -  Intact skin with non-blanchable redness of a localized area usually over a bony prominence. (Active)  01/05/22 1638  Location: Coccyx  Location Orientation: Mid  Staging: Stage 1 -  Intact skin with non-blanchable redness of a localized area usually over a bony prominence.  Wound Description (Comments):   Present on Admission: Yes  Dressing Type Foam - Lift dressing to assess site every shift 01/05/22 2100    Estimated body mass index is 17.71 kg/m as calculated from the following:   Height as of this encounter: 5\' 11"  (1.803 m).   Weight as of this encounter: 57.6 kg.   DVT prophylaxis: lovenox Family Communication:none Status is: Observation The patient remains OBS appropriate and will d/c before 2 midnights.    Code Status:     Code Status Orders  (From admission, onward)           Start     Ordered   01/05/22 1556  Full code  Continuous        01/05/22 1601           Code Status History     Date Active Date Inactive Code Status Order ID Comments User Context   05/14/2021 1218 05/14/2021 2049 DNR [id number]  Edsel Petrin, DO Inpatient   05/12/2021 2130 05/14/2021  1218 Full Code [id number]  Joseph Art, DO Inpatient   05/04/2021 2144 05/08/2021 2014 Full Code [id number]  Angie Fava, DO ED   03/26/2021 1355 03/28/2021 2235 Full Code [id number]  Lucile Shutters, MD ED   11/13/2020 2127 11/26/2020 1317 DNR [id number]  Eduard Clos, MD ED   10/28/2020 1226 11/01/2020 2052 DNR [id number]  Jae Dire, MD ED   05/13/2020 0107 05/21/2020 1406 Full Code [id number]  Macon Large, MD ED   12/16/2017 1708 12/17/2017 1527 Full Code [id number]  Fayrene Helper, PA-C ED   02/15/2014 1650 02/16/2014 1928 Full Code [id number]  Zehr, Princella Pellegrini., PA-C Inpatient      Advance Directive Documentation    Flowsheet Row Most Recent Value  Type of Advance Directive Healthcare Power of Attorney, Living will  Pre-existing out of facility DNR order (yellow form or pink MOST form) --  "MOST" Form in Place? --         IV Access:   Peripheral IV   Procedures and diagnostic studies:   DG Chest 2 View  Result Date: 01/05/2022 CLINICAL DATA:  61 year old male with shortness of breath and right lower chest pain since yesterday. Status post Whipple in March. EXAM: CHEST - 2 VIEW COMPARISON:  [facility] portable chest 11/02/2021 and earlier. FINDINGS: PA and lateral views. Larger lung volumes. Stable right chest power port. Normal cardiac size and mediastinal contours. Visualized tracheal air column is within normal limits. Both lungs appear clear. No pneumothorax or pleural effusion. Partially visible surgical clips in the right upper quadrant. Negative visible bowel gas. No acute osseous abnormality identified. IMPRESSION: No acute cardiopulmonary abnormality. Electronically Signed   By: Odessa Fleming M.D.   On: 01/05/2022 10:05   CT Angio Chest PE W and/or Wo Contrast  Result Date: 01/05/2022 CLINICAL DATA:  Pulmonary embolism (PE) suspected, high prob Known cancer, recent procedure, tachycardia, tachypnea, worsened exertional shortness of breath. Rule out PE versus recurrent  abscess below his diaphragm EXAM: CT ANGIOGRAPHY CHEST WITH CONTRAST TECHNIQUE: Multidetector CT imaging of the chest was performed using the standard protocol during bolus administration of intravenous contrast. Multiplanar CT image reconstructions and MIPs were obtained to evaluate the vascular anatomy. RADIATION DOSE REDUCTION: This exam was performed according to the departmental dose-optimization program which includes automated exposure control, adjustment of the mA and/or kV according to patient size and/or use of iterative reconstruction technique. CONTRAST:  OMNIPAQUE IOHEXOL 350 MG/ML SOLN COMPARISON:  CT 12/25/2021. FINDINGS: Cardiovascular: Satisfactory opacification of the pulmonary arteries to the segmental level. No evidence of pulmonary embolism. Normal cardiac size.Small pericardial effusion, increased since 12/25/2021. Moderate atherosclerosis of the thoracic aorta. Chest port catheter tip terminates in the right atrium. Mediastinum/Nodes: No lymphadenopathy. Lungs/Pleura: No focal airspace disease. Left basilar scarring/linear atelectasis. No suspicious pulmonary nodules. Apical centrilobular and paraseptal emphysema. Upper Abdomen: No acute abnormality. Musculoskeletal: No chest wall abnormality. No acute or significant osseous findings. Review of the MIP images confirms the above findings. IMPRESSION: Increased, small pericardial effusion in comparison to prior. No pulmonary embolism or focal airspace consolidation. Apical centrilobular and paraseptal emphysema. Electronically Signed   By: Caprice Renshaw M.D.   On: 01/05/2022  12:53   CT ABDOMEN PELVIS W CONTRAST  Result Date: 01/05/2022 CLINICAL DATA:  Postop abdominal pain, evaluate for abscess. Pancreatic adenocarcinoma. * Tracking Code: BO * EXAM: CT ABDOMEN AND PELVIS WITH CONTRAST TECHNIQUE: Multidetector CT imaging of the abdomen and pelvis was performed using the standard protocol following bolus administration of intravenous  contrast. RADIATION DOSE REDUCTION: This exam was performed according to the departmental dose-optimization program which includes automated exposure control, adjustment of the mA and/or kV according to patient size and/or use of iterative reconstruction technique. CONTRAST:  75mL OMNIPAQUE IOHEXOL 300 MG/ML  SOLN COMPARISON:  CT chest 01/05/2022, CT abdomen pelvis 12/25/2021. FINDINGS: Lower chest: Minimal subsegmental atelectasis in the lung bases. Heart size normal. Small pericardial effusion with hyperattenuation of the pericardial border. Atherosclerotic calcification of the aorta and coronary arteries. No pleural fluid. Distal esophagus is grossly unremarkable. Hepatobiliary: Liver is slightly decreased in attenuation diffusely. Loculated perihepatic fluid collection has decreased in size along the anterolateral aspect of the liver, now with the largest pocket measuring 1.2 x 3.1 cm (2/31). Indwelling percutaneous drain. Cholecystectomy. No biliary ductal dilatation. Pancreas: Pancreaticoduodenectomy. Slight pancreatic ductal dilatation in the body, 4 mm, similar. Remainder of the gland is unremarkable. Spleen: Negative. Adrenals/Urinary Tract: Adrenal glands are unremarkable. Millimetric low-attenuation lesions in the right kidney are too small to characterize. No follow-up necessary. Kidneys are otherwise unremarkable. Ureters are decompressed. Bladder is grossly unremarkable. Stomach/Bowel: Stomach is unremarkable. Pancreaticoduodenectomy. Small bowel is otherwise unremarkable. Appendix is not readily appreciated. Fair amount of stool in the colon. Vascular/Lymphatic: Atherosclerotic calcification of the aorta. No pathologically enlarged lymph nodes. Reproductive: Prostate is visualized. Other: No free fluid.  Mesenteries and peritoneum are unremarkable. Musculoskeletal: Degenerative changes in the spine. No worrisome lytic or sclerotic lesions. Minimal grade 1 anterolisthesis of L4 on L5. IMPRESSION: 1.  Small pericardial effusion. Hyperattenuation of the pericardium suggest associated pericarditis. 2. Decreasing loculated subcapsular fluid along the liver margin, with percutaneous drain in place. 3. Hepatic steatosis. 4. Fair amount of stool in the colon is indicative of constipation. 5. Aortic atherosclerosis (ICD10-I70.0). Coronary artery calcification. Electronically Signed   By: Leanna Battles M.D.   On: 01/05/2022 13:50   ECHOCARDIOGRAM COMPLETE  Result Date: 01/05/2022    ECHOCARDIOGRAM REPORT   Patient Name:   [name] [name] Date of Exam: 01/05/2022 Medical Rec #:  [id number]       Height:       71.0 in Accession #:    [id number]      Weight:       130.0 lb Date of Birth:  [birth year]       BSA:          1.756 m Patient Age:    60 years        BP:           124/87 mmHg Patient Gender: M               HR:           107 bpm. Exam Location:  Inpatient Procedure: 2D Echo, Cardiac Doppler and Color Doppler STAT ECHO Indications:    I31.3 Pericardial effusion (noninflammatory)  History:        Patient has prior history of Echocardiogram examinations, most                 recent 10/30/2020. Abnormal ECG, Arrythmias:Tachycardia,                 Signs/Symptoms:Dyspnea and Shortness of Breath; Risk  Factors:Current Smoker, Hypertension and Diabetes. ETOH. Cancer.  Sonographer:    Sheralyn Boatman RDCS Referring Phys: [id number] DAVID MANUEL ORTIZ  Sonographer Comments: Patient drinking grape juice during stat echo IMPRESSIONS  1. Left ventricular ejection fraction, by estimation, is 60 to 65%. The left ventricle has normal function. The left ventricle has no regional wall motion abnormalities. Left ventricular diastolic parameters are consistent with Grade I diastolic dysfunction (impaired relaxation).  2. Right ventricular systolic function is normal. The right ventricular size is normal.  3. Moderate pericardial effusion. The pericardial effusion is circumferential. There is no evidence of cardiac tamponade.  4.  The mitral valve is normal in structure. No evidence of mitral valve regurgitation. No evidence of mitral stenosis.  5. The aortic valve is normal in structure. Aortic valve regurgitation is not visualized. No aortic stenosis is present.  6. The inferior vena cava is normal in size with greater than 50% respiratory variability, suggesting right atrial pressure of 3 mmHg. FINDINGS  Left Ventricle: Left ventricular ejection fraction, by estimation, is 60 to 65%. The left ventricle has normal function. The left ventricle has no regional wall motion abnormalities. The left ventricular internal cavity size was normal in size. There is  no left ventricular hypertrophy. Left ventricular diastolic parameters are consistent with Grade I diastolic dysfunction (impaired relaxation). Right Ventricle: The right ventricular size is normal. No increase in right ventricular wall thickness. Right ventricular systolic function is normal. Left Atrium: Left atrial size was normal in size. Right Atrium: Right atrial size was normal in size. Pericardium: A moderately sized pericardial effusion is present. The pericardial effusion is circumferential. There is no evidence of cardiac tamponade. Mitral Valve: The mitral valve is normal in structure. No evidence of mitral valve regurgitation. No evidence of mitral valve stenosis. Tricuspid Valve: The tricuspid valve is normal in structure. Tricuspid valve regurgitation is not demonstrated. No evidence of tricuspid stenosis. Aortic Valve: The aortic valve is normal in structure. Aortic valve regurgitation is not visualized. No aortic stenosis is present. Pulmonic Valve: The pulmonic valve was normal in structure. Pulmonic valve regurgitation is not visualized. No evidence of pulmonic stenosis. Aorta: The aortic root is normal in size and structure. Venous: The inferior vena cava is normal in size with greater than 50% respiratory variability, suggesting right atrial pressure of 3 mmHg.  IAS/Shunts: No atrial level shunt detected by color flow Doppler.  LEFT VENTRICLE PLAX 2D LVIDd:         3.50 cm     Diastology LVIDs:         1.90 cm     LV e' medial:    6.53 cm/s LV PW:         1.20 cm     LV E/e' medial:  10.8 LV IVS:        1.00 cm     LV e' lateral:   5.87 cm/s                            LV E/e' lateral: 12.0  LV Volumes (MOD) LV vol d, MOD A2C: 41.1 ml LV vol d, MOD A4C: 54.0 ml LV vol s, MOD A2C: 12.0 ml LV vol s, MOD A4C: 13.3 ml LV SV MOD A2C:     29.1 ml LV SV MOD A4C:     54.0 ml LV SV MOD BP:      34.5 ml RIGHT VENTRICLE  IVC RV S prime:     11.90 cm/s  IVC diam: 1.95 cm TAPSE (M-mode): 1.7 cm LEFT ATRIUM           Index       RIGHT ATRIUM          Index LA diam:      2.60 cm 1.48 cm/m  RA Area:     9.02 cm LA Vol (A2C): 8.7 ml  4.95 ml/m  RA Volume:   17.50 ml 9.97 ml/m LA Vol (A4C): 7.4 ml  4.23 ml/m  AORTIC VALVE LVOT Vmax:   98.50 cm/s LVOT Vmean:  70.600 cm/s LVOT VTI:    0.167 m  AORTA Ao Root diam: 3.60 cm MITRAL VALVE MV Area (PHT): 4.89 cm    SHUNTS MV Decel Time: 155 msec    Systemic VTI: 0.17 m MV E velocity: 70.70 cm/s MV A velocity: 87.00 cm/s MV E/A ratio:  0.81 Donato Schultz MD Electronically signed by Donato Schultz MD Signature Date/Time: 01/05/2022/4:23:33 PM    Final      Medical Consultants:   None.   Subjective:    [name] [name] relates his breathing is better some tenderness around the percutaneous drain  Objective:    Vitals:   01/05/22 1742 01/05/22 2024 01/06/22 0008 01/06/22 0434  BP:  133/77 (!) 143/80 128/76  Pulse:  (!) 106 92 85  Resp:  20 18 18   Temp: 98.8 F (37.1 C) 98.4 F (36.9 C) 98.2 F (36.8 C) 98.1 F (36.7 C)  TempSrc: Oral Oral Oral Oral  SpO2:  97% 96% 96%  Weight:      Height:       SpO2: 96 %   Intake/Output Summary (Last 24 hours) at 01/06/2022 0811 Last data filed at 01/06/2022 0600 Gross per 24 hour  Intake 2591.25 ml  Output 650 ml  Net 1941.25 ml   Filed Weights   01/05/22 1632  Weight:  57.6 kg    Exam: General exam: In no acute distress. Respiratory system: Good air movement and clear to auscultation. Cardiovascular system: S1 & S2 heard, RRR. No JVD. Gastrointestinal system: Abdomen is nondistended, soft and nontender.  Some tenderness around the entrance of the drain does not appear to be infected Extremities: No pedal edema. Skin: Left foot stump looks clean no signs of infection small Scab that is dry Psychiatry: Judgement and insight appear normal. Mood & affect appropriate.    Data Reviewed:    Labs: Basic Metabolic Panel: Recent Labs  Lab 01/05/22 1117 01/06/22 0528  NA 130* 133*  K 4.0 3.4*  CL 91* 96*  CO2 20* 31  GLUCOSE 295* 102*  BUN 7 6  CREATININE 0.59* 0.37*  CALCIUM 8.9 8.9   GFR Estimated Creatinine Clearance: 81 mL/min (A) (by C-G formula based on SCr of 0.37 mg/dL (L)). Liver Function Tests: Recent Labs  Lab 01/05/22 1117 01/06/22 0528  AST 27 20  ALT 17 12  ALKPHOS 187* 145*  BILITOT 1.6* 0.4  PROT 8.0 6.6  ALBUMIN 3.1* 2.5*   Recent Labs  Lab 01/05/22 1117  LIPASE 17   No results for input(s): AMMONIA in the last 168 hours. Coagulation profile No results for input(s): INR, PROTIME in the last 168 hours. COVID-19 Labs  No results for input(s): DDIMER, FERRITIN, LDH, CRP in the last 72 hours.  Lab Results  Component Value Date   SARSCOV2NAA NEGATIVE 01/05/2022   SARSCOV2NAA NEGATIVE 05/12/2021   SARSCOV2NAA NEGATIVE 05/04/2021   SARSCOV2NAA NEGATIVE  03/26/2021    CBC: Recent Labs  Lab 01/05/22 1117 01/06/22 0528  WBC 11.9* 9.4  NEUTROABS 9.4*  --   HGB 11.0* 10.1*  HCT 33.8* 30.5*  MCV 87.8 86.6  PLT 338 261   Cardiac Enzymes: No results for input(s): CKTOTAL, CKMB, CKMBINDEX, TROPONINI in the last 168 hours. BNP (last 3 results) No results for input(s): PROBNP in the last 8760 hours. CBG: Recent Labs  Lab 01/05/22 1706 01/05/22 1903 01/05/22 2239 01/06/22 0754  GLUCAP 432* 321* 87 118*    D-Dimer: No results for input(s): DDIMER in the last 72 hours. Hgb A1c: No results for input(s): HGBA1C in the last 72 hours. Lipid Profile: No results for input(s): CHOL, HDL, LDLCALC, TRIG, CHOLHDL, LDLDIRECT in the last 72 hours. Thyroid function studies: No results for input(s): TSH, T4TOTAL, T3FREE, THYROIDAB in the last 72 hours.  Invalid input(s): FREET3 Anemia work up: No results for input(s): VITAMINB12, FOLATE, FERRITIN, TIBC, IRON, RETICCTPCT in the last 72 hours. Sepsis Labs: Recent Labs  Lab 01/05/22 1117 01/05/22 1119 01/05/22 1229 01/06/22 0528  WBC 11.9*  --   --  9.4  LATICACIDVEN  --  1.1 0.8  --    Microbiology Recent Results (from the past 240 hour(s))  Resp Panel by RT-PCR (Flu A&B, Covid) Nasopharyngeal Swab     Status: None   Collection Time: 01/05/22  3:38 PM   Specimen: Nasopharyngeal Swab; Nasopharyngeal(NP) swabs in vial transport medium  Result Value Ref Range Status   SARS Coronavirus 2 by RT PCR NEGATIVE NEGATIVE Final    Comment: (NOTE) SARS-CoV-2 target nucleic acids are NOT DETECTED.  The SARS-CoV-2 RNA is generally detectable in upper respiratory specimens during the acute phase of infection. The lowest concentration of SARS-CoV-2 viral copies this assay can detect is 138 copies/mL. A negative result does not preclude SARS-Cov-2 infection and should not be used as the sole basis for treatment or other patient management decisions. A negative result may occur with  improper specimen collection/handling, submission of specimen other than nasopharyngeal swab, presence of viral mutation(s) within the areas targeted by this assay, and inadequate number of viral copies(<138 copies/mL). A negative result must be combined with clinical observations, patient history, and epidemiological information. The expected result is Negative.  Fact Sheet for Patients:  BloggerCourse.com  Fact Sheet for Healthcare Providers:   SeriousBroker.it  This test is no t yet approved or cleared by the [place] FDA and  has been authorized for detection and/or diagnosis of SARS-CoV-2 by FDA under an Emergency Use Authorization (EUA). This EUA will remain  in effect (meaning this test can be used) for the duration of the COVID-19 declaration under Section 564(b)(1) of the Act, 21 U.S.C.section 360bbb-3(b)(1), unless the authorization is terminated  or revoked sooner.       Influenza A by PCR NEGATIVE NEGATIVE Final   Influenza B by PCR NEGATIVE NEGATIVE Final    Comment: (NOTE) The Xpert Xpress SARS-CoV-2/FLU/RSV plus assay is intended as an aid in the diagnosis of influenza from Nasopharyngeal swab specimens and should not be used as a sole basis for treatment. Nasal washings and aspirates are unacceptable for Xpert Xpress SARS-CoV-2/FLU/RSV testing.  Fact Sheet for Patients: BloggerCourse.com  Fact Sheet for Healthcare Providers: SeriousBroker.it  This test is not yet approved or cleared by the [place] FDA and has been authorized for detection and/or diagnosis of SARS-CoV-2 by FDA under an Emergency Use Authorization (EUA). This EUA will remain in effect (meaning this test can be  used) for the duration of the COVID-19 declaration under Section 564(b)(1) of the Act, 21 U.S.C. section 360bbb-3(b)(1), unless the authorization is terminated or revoked.  Performed at [facility], [address], [place], [place] [address]      Medications:    amoxicillin-clavulanate  1 tablet Oral BID   aspirin EC  81 mg Oral Daily   budesonide (PULMICORT) nebulizer solution  0.5 mg Nebulization BID   Chlorhexidine Gluconate Cloth  6 each Topical Daily   DULoxetine  60 mg Oral Daily   feeding supplement  237 mL Oral BID BM   fluconazole  400 mg Oral Daily   insulin aspart  0-15 Units Subcutaneous TID WC   insulin  NPH Human  35 Units Subcutaneous QAC breakfast   lipase/protease/amylase  24,000 Units Oral TID with meals   mirtazapine  30 mg Oral QHS   oxyCODONE  10 mg Oral Q12H   pantoprazole  40 mg Oral Daily   potassium chloride  40 mEq Oral Once   sodium chloride flush  10-40 mL Intracatheter Q12H   Zinc Oxide   Topical BID   Continuous Infusions:  lactated ringers 125 mL/hr at 01/06/22 0323      LOS: 0 days   Marinda Elk  [facility]  01/06/2022, 8:11 AM

## 2022-01-06 NOTE — Progress Notes (Addendum)
Inpatient Diabetes Program Recommendations ? ?AACE/ADA: New Consensus Statement on Inpatient Glycemic Control (2015) ? ?Target Ranges:  Prepandial:   less than 140 mg/dL ?     Peak postprandial:   less than 180 mg/dL (1-2 hours) ?     Critically ill patients:  140 - 180 mg/dL  ? ?Lab Results  ?Component Value Date  ? GLUCAP 118 (H) 01/06/2022  ? HGBA1C 9.7 (A) 09/03/2021  ? ? ?Review of Glycemic Control ? Latest Reference Range & Units 01/05/22 17:06 01/05/22 19:03 01/05/22 22:39 01/06/22 07:54  ?Glucose-Capillary 70 - 99 mg/dL 432 (H) 321 (H) 87 118 (H)  ?(H): Data is abnormally high ? ?Diabetes history: DM2 ?Outpatient Diabetes medications: NPH 35 units QD ?Current orders for Inpatient glycemic control: NPH 35 units today, 20 units to start tomorrow, Novolog 0-15 units TID  ? ?Inpatient Diabetes Program Recommendations:   ? ?Noted NPH 35 units due at 0800.  Please consider decreasing to 20 units.   ? ?Will continue to follow while inpatient. ? ?Thank you, ?Reche Dixon, MSN, RN ?Diabetes Coordinator ?Inpatient Diabetes Program ?[phone] (team pager from 8a-5p) ? ? ? ?

## 2022-01-06 NOTE — Consult Note (Signed)
WOC Nurse Consult Note: ?Patient receiving care in [place]. ?Reason for Consult: left foot wound ?Wound type: Diabetic foot ulcer. Seen in office of Dr. Bearl Mulberry for wound treatment and evaluation immediately prior to coming to ED due to shortness of breath.  In Dr. Glenford Peers office the foot wound was debrided. The measurements and characteristics of this wound and a stage 2 PI to the buttocks/sacral area were recorded and treatments ordered. She recommended zinc oxide to the right gluteus area, which I have continued in our orders. For the foot she ordered Prisma Silver collagen to the foot every 2 days. The dressing is to remain in place until 01/07/22.  This product is not in the [facility].  Therefore, I have started the use of Aquacel Advantage every day as a substitute, with dressing changes to start 01/07/22. ?Pressure Injury POA: Yes ?Measurement: Foot wound at the left 1st metatarsal head measures 0.8 cm x 0.6 cm x 0.4 cm with fat exposed. ?Wound bed: ?Drainage (amount, consistency, odor) serosanginous ?Periwound: ?Dressing procedure/placement/frequency: ?Cleanse wound on left foot with saline, pat dry. Place a piece of Aquacel Advantage Kellie Simmering [id number]) into the wound, cover with gauze.  ? ?Triple Paste to buttocks twice daily and after each incontinence cleaning. ? ?Float the heels. ? ? ? ?  ?

## 2022-01-06 NOTE — Evaluation (Addendum)
Physical Therapy Evaluation ?Patient Details ?Name: [name] [name] ?MRN: [id number] ?DOB: 12-Dec-1960 ?Today's Date: 01/06/2022 ? ?History of Present Illness ? 61 yo male admitted with acute respiratory failure, pericardial effusion. 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 On eval, pt was Min guard-Min A for mobility. He walked ~250 feet without a device. He did have loss of balance x 2 with initial standing at bedside (fell back onto the bed each time). On 3rd stand, pt was able to stabilize and proceed with ambulation. He was intermittently unsteady but no further losses of balance during ambulation. O2 93% on RA-pt reports some dyspnea with activity. Will plan to follow pt during hospital stay. He did not feel he needed PT f/u but he did state he could use some help with household tasks. He does report hx of multiple falls. Could possibly benefit from PT f/u after discharge if he would be agreeable to it.  ?   ? ?Recommendations for follow up therapy are one component of a multi-disciplinary discharge planning process, led by the attending physician.  Recommendations may be updated based on patient status, additional functional criteria and insurance authorization. ? ?Follow Up Recommendations HHPT vs OPPT-if pt is agreeable-not sure if HHPT is an option for him ? ?  ?Assistance Recommended at Discharge PRN  ?Patient can return home with the following ? Assistance with cooking/housework ? ?  ?Equipment Recommendations None recommended by PT  ?Recommendations for Other Services ?    ?  ?Functional Status Assessment Patient has had a recent decline in their functional status and demonstrates the ability to make significant improvements in function in a reasonable and predictable amount of time.  ? ?  ?Precautions / Restrictions Precautions ?Precautions: Fall ?Restrictions ?Weight Bearing Restrictions: No  ? ?  ? ?Mobility ? Bed  Mobility ?Overal bed mobility: Modified Independent ?  ?  ?  ?  ?  ?  ?  ?  ? ?Transfers ?Overall transfer level: Needs assistance ?  ?Transfers: Sit to/from Stand ?Sit to Stand: Min assist ?  ?  ?  ?  ?  ?General transfer comment: LOB x 2 with initial standing at side of bed. On 3rd stand, pt was able to stabilize himself then proceed with ambulation. ?  ? ?Ambulation/Gait ?Ambulation/Gait assistance: Min guard ?Gait Distance (Feet): 250 Feet ?Assistive device: None ?Gait Pattern/deviations: Step-through pattern ?  ?  ?  ?General Gait Details: Min guard for safety. Unsteady at times but no overt LOB. O2 93% on RA. Pt reports some dyspnea with ambulation. ? ?Stairs ?  ?  ?  ?  ?  ? ?Wheelchair Mobility ?  ? ?Modified Rankin (Stroke Patients Only) ?  ? ?  ? ?Balance Overall balance assessment: History of Falls, Needs assistance ?  ?  ?  ?  ?Standing balance support: During functional activity ?Standing balance-Leahy Scale: Fair ?  ?  ?  ?  ?  ?  ?  ?  ?  ?  ?  ?  ?   ? ? ? ?Pertinent Vitals/Pain Pain Assessment ?Pain Assessment: No/denies pain  ? ? ?Home Living Family/patient expects to be discharged to:: Private residence ?Living Arrangements: Alone ?Available Help at Discharge: Friend(s);Available PRN/intermittently ?Type of Home: House ?Home Access: Stairs to enter ?Entrance Stairs-Rails: Right ?Entrance Stairs-Number of Steps: 3 ?  ?Home Layout: One level ?Home Equipment: Grab bars - tub/shower;Rolling Walker (2 wheels);Cane - single point;Wheelchair -  manual ?   ?  ?Prior Function Prior Level of Function : Independent/Modified Independent ?  ?  ?  ?  ?  ?  ?  ?  ?  ? ? ?Hand Dominance  ?   ? ?  ?Extremity/Trunk Assessment  ? Upper Extremity Assessment ?Upper Extremity Assessment: Overall WFL for tasks assessed ?  ? ?Lower Extremity Assessment ?Lower Extremity Assessment: Generalized weakness ?  ? ?Cervical / Trunk Assessment ?Cervical / Trunk Assessment: Normal  ?Communication  ? Communication: No difficulties   ?Cognition Arousal/Alertness: Awake/alert ?Behavior During Therapy: [facility] for tasks assessed/performed ?Overall Cognitive Status: Within Functional Limits for tasks assessed ?  ?  ?  ?  ?  ?  ?  ?  ?  ?  ?  ?  ?  ?  ?  ?  ?  ?  ?  ? ?  ?General Comments   ? ?  ?Exercises    ? ?Assessment/Plan  ?  ?PT Assessment Patient needs continued PT services  ?PT Problem List Decreased strength;Decreased mobility;Decreased activity tolerance;Decreased balance ? ?   ?  ?PT Treatment Interventions Therapeutic activities;Therapeutic exercise;Gait training;DME instruction;Patient/family education;Balance training;Functional mobility training   ? ?PT Goals (Current goals can be found in the Care Plan section)  ?Acute Rehab PT Goals ?Patient Stated Goal: home soon ?PT Goal Formulation: With patient ?Time For Goal Achievement: 01/20/22 ?Potential to Achieve Goals: Good ? ?  ?Frequency Min 3X/week ?  ? ? ?Co-evaluation   ?  ?  ?  ?  ? ? ?  ?AM-PAC PT "6 Clicks" Mobility  ?Outcome Measure Help needed turning from your back to your side while in a flat bed without using bedrails?: None ?Help needed moving from lying on your back to sitting on the side of a flat bed without using bedrails?: None ?Help needed moving to and from a bed to a chair (including a wheelchair)?: A Little ?Help needed standing up from a chair using your arms (e.g., wheelchair or bedside chair)?: A Little ?Help needed to walk in hospital room?: A Little ?Help needed climbing 3-5 steps with a railing? : A Little ?6 Click Score: 20 ? ?  ?End of Session Equipment Utilized During Treatment: Gait belt ?Activity Tolerance: Patient tolerated treatment well ?Patient left: in bed;with call bell/phone within reach;with bed alarm set ?  ?PT Visit Diagnosis: Unsteadiness on feet (R26.81);History of falling (Z91.81);Repeated falls (R29.6);Muscle weakness (generalized) (M62.81) ?  ? ?Time: 2449-7530 ?PT Time Calculation (min) (ACUTE ONLY): 13 min ? ? ?Charges:   PT Evaluation ?$PT  Eval Moderate Complexity: 1 Mod ?  ?  ? ? ? ?Kyley Solow P, PT ?Acute Rehabilitation  ?Office: [phone] ?Pager: [phone] ? ?  ? ?

## 2022-01-06 NOTE — Consult Note (Addendum)
?Cardiology Consultation:  ? ?Patient ID: [name] ?MRN: [id number]; DOB: 1960-11-29 ? ?Admit date: 01/05/2022 ?Date of Consult: 01/06/2022 ? ?PCP:  [name], NP ?  ?[facility] Providers ?Cardiologist:  New to [facility]  ? ? ?Patient Profile:  ? ?JAYSIN GAYLER is a 61 y.o. male with a hx of tobacco abuse, Barrett's esophagus, cirrhosis due to hepatitis C and history of alcohol use, cured hep C, stage IIB pancreatic adenocarcinoma s/p chemotherapy and  open whipple with portal vein reconstruction 10/28/21 with subsequent surgical site infection and intraabdominal abscess, depression, type 2 diabetes with neuropathy, s/p left foot TMA, chronic left foot wound, hypogonadism, aortic atherosclerosis, gastroparesis, who is being seen 01/06/2022 for the evaluation of pericardial effusion at the request of Dr Olevia Bowens. ? ?History of Present Illness:  ? ?Mr. Binsfeld with above PMH presented to the ER on for worsening of abdominal pain, SOB, and fever for 24 hours. He was tachycardiac at ED, reportedly had brief episode of hypoxia with Pox dropping to 80% with subsequent recovery in minutes. He was subsequently admitted to hospital medicine for acute hypoxic respiratory failure.  ? ?He has hx of Barrett's esophagus, cirrhosis 2/2 chronic hepatitis C and ETOH use, underwent treatment with hepatitis C eradicated, unfortunately diagnosed with pancreatic adenocarcinoma in June 2022 after presenting with jaundice with pancreatic mass causing CBD obstruction. CT chest from 03/05/21 without metastatic disease in the chest. He was treated with FOLFIRINOX cycles by oncology. He underwent open whipple with portal vein reconstruction utilizing a R deep FV graft at [facility] on 10/28/21, he was then recommended adjuvant chemoradiation due to positive margins. Post-op course is subsequently complicated by surgical incision infection and intraabdominal abscess requiring percutaneous drainage. He was last seen by oncology in 12/22/21 and was  planned for chemoradiation with Xeloda once infection is under control.  ? ?He states he was feeling poor at admission, has not been eating or drinking for a week, had falls at home hitting his head. He reports worsening abdominal pain, SOB at home. He had intermittent mid-sternum pain as well. He wishes he can exercise and get back to shape. He denied any dizziness, syncope, weight gain, leg edema. He has no prior cardiac disease,  ? ?Admission diagnostics showed metabolic derangement with hyponatremia 130, hypochloremia 91, bicarb 20, hyperglycemia 295, alk phos 187, and albumin 3.1. CBC with leukocytosis 11900 and Hgb 11. UA SG >1.046, + glucose and ketones. Urine and blood culture NTD. BNP 54. Hs trop negative x2. Lactic acid WNL. Flu and COVID-19 negative.   CTA chest negative for PE, small pericardial effusion, increased since 12/25/2021, and emphysema. CTAP showed small pericardial effusion. Hyperattenuation of the pericardium suggest associated pericarditis. Decreasing loculated subcapsular fluid along the liver margin, with percutaneous drain in place. Echo from 01/05/22 showed LVEF 60-65%, no RWMA, grade I DD, normal RV, moderate pericardial effusion without evidence of cardiac tamponade. No significant valvular disease.   ? ?Cardiology is consulted today for pericardial effusion.  ? ? ?Past Medical History:  ?Diagnosis Date  ? Barrett's esophagus dx 2016  ? Bronchitis   ? Chronic hepatitis C without hepatic coma ([place]) 10/31/2014  ? Depression   ? Diabetes ([place])   ? ED (erectile dysfunction)   ? GERD (gastroesophageal reflux disease)   ? Hepatitis C   ? Hiatal hernia 10/2014  ? 3cm  ? Neuropathy   ? S/P transmetatarsal amputation of foot, left ([place]) 11/28/2020  ? ? ?Past Surgical History:  ?Procedure Laterality Date  ?  AMPUTATION Left 11/15/2020  ? Procedure: LEFT TRANSMETATARSALS AMPUTATION;  Surgeon: Newt Minion, MD;  Location: [facility];  Service: Orthopedics;  Laterality: Left;  ? APPENDECTOMY    ? BILIARY  STENT PLACEMENT N/A 02/27/2021  ? Procedure: BILIARY STENT PLACEMENT;  Surgeon: Milus Banister, MD;  Location: [facility];  Service: Endoscopy;  Laterality: N/A;  ? BILIARY STENT PLACEMENT N/A 03/27/2021  ? Procedure: BILIARY STENT PLACEMENT;  Surgeon: Jackquline Denmark, MD;  Location: [facility];  Service: Endoscopy;  Laterality: N/A;  ? BIOPSY  03/27/2021  ? Procedure: BIOPSY;  Surgeon: Jackquline Denmark, MD;  Location: [facility];  Service: Endoscopy;;  ? COLONOSCOPY N/A 02/16/2014  ? Procedure: COLONOSCOPY;  Surgeon: Gatha Mayer, MD;  Location: [facility];  Service: Endoscopy;  Laterality: N/A;  ? COLONOSCOPY    ? ENDOSCOPIC RETROGRADE CHOLANGIOPANCREATOGRAPHY (ERCP) WITH PROPOFOL N/A 02/27/2021  ? Procedure: ENDOSCOPIC RETROGRADE CHOLANGIOPANCREATOGRAPHY (ERCP) WITH PROPOFOL;  Surgeon: Milus Banister, MD;  Location: [facility];  Service: Endoscopy;  Laterality: N/A;  ? ERCP N/A 03/27/2021  ? Procedure: ENDOSCOPIC RETROGRADE CHOLANGIOPANCREATOGRAPHY (ERCP);  Surgeon: Jackquline Denmark, MD;  Location: [facility];  Service: Endoscopy;  Laterality: N/A;  ? ESOPHAGOGASTRODUODENOSCOPY (EGD) WITH PROPOFOL N/A 05/06/2021  ? Procedure: ESOPHAGOGASTRODUODENOSCOPY (EGD) WITH PROPOFOL;  Surgeon: Mauri Pole, MD;  Location: [facility];  Service: Endoscopy;  Laterality: N/A;  ? EUS N/A 02/27/2021  ? Procedure: UPPER ENDOSCOPIC ULTRASOUND (EUS) RADIAL;  Surgeon: Milus Banister, MD;  Location: [facility];  Service: Endoscopy;  Laterality: N/A;  ? FINE NEEDLE ASPIRATION N/A 02/27/2021  ? Procedure: FINE NEEDLE ASPIRATION (FNA) LINEAR;  Surgeon: Milus Banister, MD;  Location: [facility];  Service: Endoscopy;  Laterality: N/A;  ? IR IMAGING GUIDED PORT INSERTION  03/21/2021  ? SPHINCTEROTOMY  02/27/2021  ? Procedure: SPHINCTEROTOMY;  Surgeon: Milus Banister, MD;  Location: [facility];  Service: Endoscopy;;  ? STENT REMOVAL  03/27/2021  ? Procedure: STENT REMOVAL;  Surgeon: Jackquline Denmark, MD;  Location: [facility];  Service: Endoscopy;;  ? TONSILLECTOMY    ?  ? ?Home Medications:  ?Prior to Admission medications   ?Medication Sig Start Date End Date Taking? Authorizing Provider  ?acetaminophen (TYLENOL) 500 MG tablet Take 500 mg by mouth every 6 (six) hours as needed for moderate pain.   Yes [provider]  ?albuterol (VENTOLIN HFA) 108 (90 Base) MCG/ACT inhaler Inhale 2 puffs into the lungs every 6 (six) hours as needed for wheezing or shortness of breath. ?Patient taking differently: Inhale 1 puff into the lungs every 6 (six) hours as needed for shortness of breath. 11/25/21  Yes Orson Slick, MD  ?amoxicillin-clavulanate (AUGMENTIN) 875-125 MG tablet Take 1 tablet by mouth 2 (two) times daily. 12/27/21  Yes [provider]  ?aspirin 81 MG EC tablet Take 1 tablet by mouth daily. 12/02/21  Yes [name], NP  ?atorvastatin (LIPITOR) 20 MG tablet Take 1 tablet (20 mg total) by mouth daily. 10/02/21 03/01/22 Yes [name], NP  ?chlorhexidine (PERIDEX) 0.12 % solution Rinse between teeth using irrigation syringe (15 mL) 2-3 times a day or as needed for gum irritation. ?Patient taking differently: Use as directed 5 mLs in the mouth or throat daily as needed (for gum irriation). 09/12/21  Yes Owsley, Madison B, DMD  ?diphenoxylate-atropine (LOMOTIL) 2.5-0.025 MG tablet Take 1 tablet by mouth 4 (four) times daily as needed for diarrhea or loose stools. 10/08/21  Yes [name], NP  ?DULoxetine (CYMBALTA) 60 MG  capsule Take 1 capsule (60 mg total) by mouth daily. 09/10/21 03/01/22 Yes [name], NP  ?folic acid (FOLVITE) 1 MG tablet Take 1 tablet (1 mg total) by mouth in the morning. 03/28/21  Yes Rai, Ripudeep K, MD  ?Insulin NPH, Human,, Isophane, (HUMULIN N KWIKPEN) 100 UNIT/ML Kiwkpen Inject 35 Units into the skin every morning. ?Patient taking differently: Inject 35 Units into the skin daily. 09/03/21  Yes Renato Shin, MD  ?lidocaine-prilocaine (EMLA) cream Apply 1 application  topically as directed as needed. 08/07/21  Yes Dede Query T, PA-C  ?lipase/protease/amylase (CREON) 12000-38000 units CPEP capsule Take 39,[id number],791 Units by mouth See admin instructions. Take 2 capsules by mouth

## 2022-01-07 ENCOUNTER — Other Ambulatory Visit: Payer: Self-pay | Admitting: Physician Assistant

## 2022-01-07 ENCOUNTER — Other Ambulatory Visit: Payer: Self-pay

## 2022-01-07 DIAGNOSIS — E43 Unspecified severe protein-calorie malnutrition: Secondary | ICD-10-CM | POA: Insufficient documentation

## 2022-01-07 DIAGNOSIS — I309 Acute pericarditis, unspecified: Secondary | ICD-10-CM | POA: Diagnosis not present

## 2022-01-07 DIAGNOSIS — I3139 Other pericardial effusion (noninflammatory): Secondary | ICD-10-CM

## 2022-01-07 DIAGNOSIS — J9601 Acute respiratory failure with hypoxia: Secondary | ICD-10-CM | POA: Diagnosis not present

## 2022-01-07 DIAGNOSIS — R188 Other ascites: Secondary | ICD-10-CM | POA: Diagnosis not present

## 2022-01-07 LAB — C-REACTIVE PROTEIN: CRP: 8.5 mg/dL — ABNORMAL HIGH (ref <1.0)

## 2022-01-07 LAB — GLUCOSE, CAPILLARY
Glucose-Capillary: 240 mg/dL — ABNORMAL HIGH (ref 70–99)
Glucose-Capillary: 205 mg/dL — ABNORMAL HIGH (ref 70–99)
Glucose-Capillary: 137 mg/dL — ABNORMAL HIGH (ref 70–99)

## 2022-01-07 MED ORDER — AMOXICILLIN-POT CLAVULANATE 875-125 MG PO TABS: 1.0000 | ORAL_TABLET | Freq: Two times a day (BID) | ORAL | 0 refills | Status: AC

## 2022-01-07 MED ORDER — ADULT MULTIVITAMIN W/MINERALS CH
1.0000 | ORAL_TABLET | Freq: Every day | ORAL | Status: DC
  Administered 2022-01-07: 1 via ORAL
  Filled 2022-01-07: qty 1

## 2022-01-07 MED ORDER — SODIUM CHLORIDE 0.9 % IV BOLUS
500.0000 mL | Freq: Once | INTRAVENOUS | Status: AC
  Administered 2022-01-07: 500 mL via INTRAVENOUS

## 2022-01-07 MED ORDER — ENSURE ENLIVE PO LIQD
237.0000 mL | Freq: Three times a day (TID) | ORAL | Status: DC
  Administered 2022-01-07: 237 mL via ORAL

## 2022-01-07 MED ORDER — FLUCONAZOLE 200 MG PO TABS
400.0000 mg | ORAL_TABLET | Freq: Every day | ORAL | 0 refills | Status: DC
  Filled 2022-01-07 – 2022-01-08 (×3): qty 3, 1d supply, fill #0

## 2022-01-07 MED ORDER — JUVEN PO PACK
1.0000 | PACK | Freq: Two times a day (BID) | ORAL | Status: DC
  Administered 2022-01-07: 1 via ORAL
  Filled 2022-01-07 (×2): qty 1

## 2022-01-07 MED ORDER — HEPARIN SOD (PORK) LOCK FLUSH 100 UNIT/ML IV SOLN
500.0000 [IU] | INTRAVENOUS | Status: AC | PRN
  Administered 2022-01-07: 500 [IU]
  Filled 2022-01-07: qty 5

## 2022-01-07 NOTE — Progress Notes (Signed)
Limited echo per Dr. Gasper Sells for pericardial effusion ? ?

## 2022-01-07 NOTE — Discharge Instructions (Signed)
High Calorie, High Protein Nutrition Therapy ? ?A high-calorie, high-protein diet has been recommended to you. Your registered dietitian nutritionist (RDN) may have recommended this diet because you are having difficulty eating enough calories throughout the day, you have lost weight, and/or you need to add protein to your diet. ? ?Sometimes you may not feel like eating, even if you know the importance of good nutrition. The recommendations in this handout can help you with the following: ? ?Regaining your strength and energy ?Keeping your body healthy ?Healing and recovering from surgery or illness and fighting infection ? ?Schedule Your Meals and Snacks ? ?Several small meals and snacks are often better tolerated and digested than large meals. ? ?Strategies ? ?Plan to eat 3 meals and 3 snacks daily. ?Experiment with timing meals to find out when you have a larger appetite. ?Appetite may be greatest in the morning after not eating all night so you may prefer to eat your larger meals and snacks in the morning and at lunch. ?Breakfast-type foods are often better tolerated so eat foods such as eggs, pancakes, waffles and cereal for any meal or snack. ?Carry snacks with you so you are prepared to eat every 2 to 3 hours. ?Determine what works best for you if your body?s cues for feeling hungry or full are not working. ?Eat a small meal or snack even if you don?t feel hungry. ?Set a timer to remind you when it is time to eat. ?Take a walk before you eat (with health care provider?s approval). ?Light or moderate physical activity can help you maintain muscle and increase your appetite. ? ?Make Eating Enjoyable ? ?Taking steps to make the experience enjoyable may help to increase your interest in eating and improve your appetite. ? ?Strategies: ? ?Eat with others whenever possible. ?Include your favorite foods to make meals more enjoyable. ?Try new foods. ?Save your beverage for the end of the meal so that you have more  room for food before you get full. ? ?Add Calories to Your Meals and Snacks ? ?Try adding calorie-dense foods so that each bite provides more nutrition. ? ?Strategies ? ?Drink milk, chocolate milk, soy milk, or smoothies instead of low-calorie beverages such as diet drinks or water. ?Cook with milk or soy milk instead of water when making dishes such as hot cereal, cocoa, or pudding. ?Add jelly, jam, honey, butter or margarine to bread and crackers. Add jam or fruit to ice cream and as a topping over cake. ?Mix dried fruit, nuts, granola, honey, or dry cereal with yogurt or hot cereals. ?Enjoy snacks such as milkshakes, smoothies, pudding, ice cream, or custard. ?Blend a fruit smoothie of a banana, frozen berries, milk or soy milk, and 1 tablespoon nonfat powdered milk or protein powder. ? ?Add Protein to Your Meals and Snacks ? ?Choose at least one protein food at each meal and snack to increase your daily intake. ? ?Strategies ? ?Add ? cup nonfat dry milk powder or protein powder to make a high-protein milk to drink or to use in recipes that call for milk. Vanilla or peppermint extract or unsweetened cocoa powder could help to boost the flavor. ?Add hard-cooked eggs, leftover meat, grated cheese, canned beans or tofu to noodles, rice, salads, sandwiches, soups, casseroles, pasta, tuna and other mixed dishes. ?Add powdered milk or protein powder to hot cereals, meatloaf, casseroles, scrambled eggs, sauces, cream soups, and shakes. ?Add beans and lentils to salads, soups, casseroles, and vegetable dishes. ?Eat cottage cheese or yogurt, especially   Greek yogurt, with fruit as a snack or dessert. ?Eat peanut or other nut butters on crackers, bread, toast, waffles, apples, bananas or celery sticks. Add it to milkshakes, smoothies, or desserts. ?Consider a ready-made protein shake.  ? ?Add Fats to Your Meals and Snacks ? ?Try adding fats to your meals and snacks. Fat provides more calories in fewer bites than  carbohydrate or protein and adds flavors to your foods. ? ?Strategies ? ?Snack on nuts and seeds or add them to foods like salads, pasta, cereals, yogurt, and ice cream.  ?Saut? or stir-fry vegetables, meats, chicken, fish or tofu in olive or canola oil.  ?Add olive oil, other vegetable oils, butter or margarine to soups, vegetables, potatoes, cooked cereal, rice, pasta, bread, crackers, pancakes, or waffles. ?Snack on olives or add to pasta, pizza, or salad. ?Add avocado or guacamole to your salads, sandwiches, and other entrees. ?Include fatty fish such as salmon in your weekly meal plan. ? ?Tips For Adding Protein Nutrition Therapy  ? ? ?Tips ?Add extra egg to one or more meals  ?Increase the portion of milk to drink and change to skim milk if able  ?Include Greek yogurt or cottage cheese for snack or part of a meal  ?Increase portion size of protein entr?e and decrease portion of starch/bread  ?Mix protein powder, nut butter, almond/nut milk, non-fat dry milk, or Greek yogurt to shakes and smoothies  ?Use these ingredients also in baked goods or other recipes ?Use double the amount of sandwich filling  ?Add protein foods to all snacks including cheese, nut butters, milk and yogurt ? ?Food Tips for Including Protein  ?Beans Cook and use dried peas, beans, and tofu in soups or add to casseroles, pastas, and grain dishes that also contain cheese or meat  ?Mash with cheese and milk  ?Use tofu to make smoothies  ?Commercial Protein Supplements Use nutritional supplements or protein powder sold at pharmacies and grocery stores  ?Use protein powder in milk drinks and desserts, such as pudding  ?Mix with ice cream, milk, and fruit or other flavorings for a high-protein milkshake  ?Cottage Cheese or Ricotta Cheese Mix with or use to stuff fruits and vegetables  ?Add to casseroles, spaghetti, noodles, or egg dishes such as omelets, scrambled eggs, and souffl?s  ?Use gelatin, pudding-type desserts, cheesecake, and pancake  or waffle batter  ?Use to stuff crepes, pasta shells, or manicotti  ?Puree and use as a substitute for sour cream  ?Eggs, Egg whites, and Egg Yolks Add chopped, hard-cooked eggs to salads and dressings, vegetables, casseroles, and creamed meats  ?Beat eggs into mashed potatoes, vegetable purees, and sauces  ?Add extra egg whites to quiches, scrambled eggs, custards, puddings, pancake batter, or French toast wash/batter  ?Make a rich custard with egg yolks, double strength milk, and sugar  ?Add extra hard-cooked yolks to deviled egg filling and sandwich spreads  ?Hard or Semi-Soft Cheese (Cheddar, Jack, Brick) Melt on sandwiches, bread, muffins, tortillas, hamburgers, hot dogs, other meats or fish, vegetables, eggs, or desserts such as stewed figs or pies  ?Grate and add to soups, sauces, casseroles, vegetable dishes, potatoes, rice noodles, or meatloaf  ?Serve as a snack with crackers or bagels  ?Ice cream, Yogurt, and Frozen Yogurt Add to milk drinks such as milkshakes  ?Add to cereals, fruits, gelatin desserts, and pies  ?Blend or whip with soft or cooked fruits  ?Sandwich ice cream or frozen yogurt between enriched cake slices, cookies, or graham crackers  ?Use seasoned   yogurt as a dip for fruits, vegetables, or chips  ?Use yogurt in place of sour cream in casseroles  ?Meat and Fish Add chopped, cooked meat or fish to vegetables, salads, casseroles, soups, sauces, and biscuit dough  ?Use in omelets, souffl?s, quiches, and sandwich fillings  ?Add chicken and turkey to stuffing  ?Wrap in pie crust or biscuit dough as turnovers  ?Add to stuffed baked potatoes  ?Add pureed meat to soups  ?Milk Use in beverages and in cooking  ?Use in preparing foods, such as hot cereal, soups, cocoa, or pudding  ?Add cream sauces to vegetable and other dishes  ?Use evaporated milk, evaporated skim milk, or sweetened condensed milk instead of milk or water in recipes.  ?Nonfat Dry Milk Add 1/3 cup of nonfat dry milk powdered milk to  each cup of regular milk for ?double strength? milk  ?Add to yogurt and milk drinks, such as pasteurized eggnog and milkshakes  ?Add to scrambled eggs and mashed potatoes  ?Use in casseroles, meatloaf, hot cereal,

## 2022-01-07 NOTE — Progress Notes (Signed)
?   01/07/22 0520  ?Assess: MEWS Score  ?Temp 98.4 ?F (36.9 ?C)  ?BP 104/64  ?Pulse Rate (!) 111  ?SpO2 93 %  ?O2 Device Room Air  ?Assess: MEWS Score  ?MEWS Temp 0  ?MEWS Systolic 0  ?MEWS Pulse 2  ?MEWS RR 0  ?MEWS LOC 0  ?MEWS Score 2  ?MEWS Score Color Yellow  ?Assess: if the MEWS score is Yellow or Red  ?Were vital signs taken at a resting state? Yes  ?Focused Assessment Change from prior assessment (see assessment flowsheet)  ?Does the patient meet 2 or more of the SIRS criteria? Yes  ?Does the patient have a confirmed or suspected source of infection? No  ?Provider and Rapid Response Notified? Yes  ?MEWS guidelines implemented *See Row Information* Yes  ?Treat  ?MEWS Interventions Other (Comment) ?(notifiedon call)  ?Pain Score 7  ?Take Vital Signs  ?Increase Vital Sign Frequency  Yellow: Q 2hr X 2 then Q 4hr X 2, if remains yellow, continue Q 4hrs  ?Escalate  ?MEWS: Escalate Yellow: discuss with charge nurse/RN and consider discussing with provider and RRT  ?Notify: Charge Nurse/RN  ?Name of Charge Nurse/RN Notified T Thurmond Butts  ?Date Charge Nurse/RN Notified 01/07/22  ?Time Charge Nurse/RN Notified [phone]  ?Notify: Provider  ?Provider Name/Title Olena Heckle  ?Date Provider Notified 01/07/22  ?Time Provider Notified [phone]  ?Method of Notification Page  ?Notification Reason Change in status  ?Provider response Other (Comment) ?(instructed to give pain meds)  ?Date of Provider Response 01/07/22  ?Time of Provider Response 0540  ?Document  ?Patient Outcome Stabilized after interventions  ?Progress note created (see row info) Yes  ?Assess: SIRS CRITERIA  ?SIRS Temperature  0  ?SIRS Pulse 1  ?SIRS Respirations  0  ?SIRS WBC 0  ?SIRS Score Sum  1  ? ? ?

## 2022-01-07 NOTE — Evaluation (Signed)
Occupational Therapy Evaluation ?Patient Details ?Name: [name] [name] ?MRN: [id number] ?DOB: 06/19/1961 ?Today's Date: 01/07/2022 ? ? ?History of Present Illness 61 yo male admitted with acute respiratory failure, pericardial effusion. 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 ?Clinical Impression ?  ?Patient lives home alone and is independent at baseline. Endorses a fall "about once a week." Most recent was in kitchen while preparing meal and states "bam I just went back." Educate patient on fall prevention strategies such as preparing meals while seated in chair. Patient also states he sometimes gets in a hurry and will lose his balance while turning. Educate patient on taking his time as impulsivity will increase risk of falls. Patient denies falls in bathroom however could benefit from shower chair to prevent future falls. Currently patient demonstrates ability to perform lower body dressing, ambulate into hallway and perform transfers without physical assistance. No further acute OT services at this time, will sign off.   ?   ? ?Recommendations for follow up therapy are one component of a multi-disciplinary discharge planning process, led by the attending physician.  Recommendations may be updated based on patient status, additional functional criteria and insurance authorization.  ? ?Follow Up Recommendations ? No OT follow up  ?  ?Assistance Recommended at Discharge None  ?   ?Functional Status Assessment ? Patient has not had a recent decline in their functional status  ?Equipment Recommendations ? Tub/shower seat (Patient denies falls in bathroom however could benefit from shower chair for fall prevention.)  ?  ?   ?Precautions / Restrictions Precautions ?Precautions: Fall ?Precaution Comments: patient reports he falls "once a week" ?Restrictions ?Weight Bearing Restrictions: No  ? ?  ? ?Mobility Bed Mobility ?Overal bed mobility: Modified Independent ?   ?  ?  ?  ?  ?  ?  ?  ? ? ? ?  ?Balance Overall balance assessment: History of Falls, Mild deficits observed, not formally tested ?  ?  ?  ?  ?  ?  ?  ?  ?  ?  ?  ?  ?  ?  ?  ?  ?  ?  ?   ? ?ADL either performed or assessed with clinical judgement  ? ?ADL Overall ADL's : At baseline ?  ?  ?  ?  ?  ?  ?  ?  ?  ?  ?  ?  ?  ?  ?  ?  ?  ?  ?  ?General ADL Comments: Patient demonstrates lower body dressing, functional ambulation and transfers without physical assistance. Educate patient on fall prevention strategies at home such as sitting in chair to meal prep, his last fall was in the kitchen at home. Patient also states he holds onto furniture at home and turns [place] losing his balance. Educate patient on importance of taking time with mobility as impulsiveness can increase risk of falls.  ? ? ? ? ?Pertinent Vitals/Pain Pain Assessment ?Pain Assessment: No/denies pain  ? ? ? ?Hand Dominance Right ?  ?Extremity/Trunk Assessment Upper Extremity Assessment ?Upper Extremity Assessment: Overall WFL for tasks assessed ?  ?Lower Extremity Assessment ?Lower Extremity Assessment: Defer to PT evaluation ?  ?Cervical / Trunk Assessment ?Cervical / Trunk Assessment: Normal ?  ?Communication Communication ?Communication: No difficulties ?  ?Cognition Arousal/Alertness: Awake/alert ?Behavior During Therapy: [facility] for tasks assessed/performed ?Overall Cognitive Status: Within Functional Limits for tasks assessed ?  ?  ?  ?  ?  ?  ?  ?  ?  ?  ?  ?  ?  ?  ?  ?  ?  ?  ?  ?   ?   ?   ? ? ?  Home Living Family/patient expects to be discharged to:: Private residence ?Living Arrangements: Alone ?Available Help at Discharge: Friend(s);Available PRN/intermittently ?Type of Home: House ?Home Access: Stairs to enter ?Entrance Stairs-Number of Steps: 3 ?Entrance Stairs-Rails: Right ?Home Layout: One level ?  ?  ?Bathroom Shower/Tub: Walk-in shower ?  ?Bathroom Toilet: Standard ?  ?  ?Home Equipment: Grab bars - tub/shower;Rolling Walker (2  wheels);Cane - single point;Wheelchair - manual ?  ?  ?  ? ?  ?Prior Functioning/Environment Prior Level of Function : Independent/Modified Independent ?  ?  ?  ?  ?  ?  ?  ?  ?  ? ?  ?  ?OT Problem List: Decreased activity tolerance ?  ?   ?   ?OT Goals(Current goals can be found in the care plan section) Acute Rehab OT Goals ?Patient Stated Goal: Home ?OT Goal Formulation: All assessment and education complete, DC therapy  ? ?AM-PAC OT "6 Clicks" Daily Activity     ?Outcome Measure Help from another person eating meals?: None ?Help from another person taking care of personal grooming?: None ?Help from another person toileting, which includes using toliet, bedpan, or urinal?: None ?Help from another person bathing (including washing, rinsing, drying)?: None ?Help from another person to put on and taking off regular upper body clothing?: None ?Help from another person to put on and taking off regular lower body clothing?: None ?6 Click Score: 24 ?  ?End of Session Nurse Communication: Mobility status ? ?Activity Tolerance: Patient tolerated treatment well ?Patient left: in bed;with call bell/phone within reach;with bed alarm set ? ?OT Visit Diagnosis: History of falling (Z91.81)  ?              ?Time: 0850-0900 ?OT Time Calculation (min): 10 min ?Charges:  OT General Charges ?$OT Visit: 1 Visit ?OT Evaluation ?$OT Eval Low Complexity: 1 Low ? ?Delbert Phenix OT ?OT pager: [phone] ? ?Rosemary Holms ?01/07/2022, 11:35 AM ?

## 2022-01-07 NOTE — Progress Notes (Signed)
Limited echo order placed per Dr. Julieanne Manson, scheduled 01/12/22 by cardmaster, appt info placed on AVS. ?

## 2022-01-07 NOTE — TOC Initial Note (Addendum)
Transition of Care (TOC) - Initial/Assessment Note  ? ? ?Patient Details  ?Name: [name] [name] ?MRN: [id number] ?Date of Birth: [birth year] ? ?Transition of Care (TOC) CM/SW Contact:    ?[place], LCSW ?Phone Number: ?01/07/2022, 3:36 PM ? ?Clinical Narrative:                 ? ?CSW met with pt to discuss [place]. Pt is agreeable to [facility] but understands it may be unable to be arranged due to staffing/insurance. CSW will attempt to arrange [facility] PT/OT/SW/Aide as ordered by attending but pt is agreeable to OPPT if unable to secure [facility]. Pt lives at home alone in an apartment. He already has a walker and a wheelchair and does not report need for any other services. Pt is aware of medicaid transportation to get to his appointments.  ? ?CSW contacted the following [place] agencies:  ?[facility]: not in network ?[facility]: cannot accept ?[facility]: cannot accept ?[facility]: cannot accept ?[facility]: cannot accept ?[facility]: cannot accept ?[facility]: cannot accept ? ?[facility]: HH unable to be arranged. CSW made OPPT referral. Bus passes provided to RN for DC.  ? ? ?Expected Discharge Plan: Home/Self Care ?Barriers to Discharge: No Barriers Identified ? ? ?Patient Goals and CMS Choice ?  ?  ?  ? ?Expected Discharge Plan and Services ?Expected Discharge Plan: Home/Self Care ?  ?  ?  ?Living arrangements for the past 2 months: Apartment ?Expected Discharge Date: 01/07/22               ?  ?  ?  ?  ?  ?  ?  ?  ?  ?  ? ?Prior Living Arrangements/Services ?Living arrangements for the past 2 months: Apartment ?Lives with:: Self ?Patient language and need for interpreter reviewed:: Yes ?Do you feel safe going back to the place where you live?: Yes      ?Need for Family Participation in Patient Care: No (Comment) ?Care giver support system in place?: No (comment) ?Current home services: DME ?Criminal Activity/Legal Involvement Pertinent to Current Situation/Hospitalization: No - Comment as needed ? ?Activities of Daily Living ?Home Assistive  Devices/Equipment: Eyeglasses ?ADL Screening (condition at time of admission) ?Patient's cognitive ability adequate to safely complete daily activities?: Yes ?Is the patient deaf or have difficulty hearing?: No ?Does the patient have difficulty seeing, even when wearing glasses/contacts?: No ?Does the patient have difficulty concentrating, remembering, or making decisions?: Yes ?Patient able to express need for assistance with ADLs?: Yes ?Does the patient have difficulty dressing or bathing?: No ?Independently performs ADLs?: No ?Communication: Independent ?Dressing (OT): Independent ?Grooming: Independent ?Feeding: Independent ?Bathing: Independent ?Toileting: Needs assistance ?Is this a change from baseline?: Pre-admission baseline ?In/Out Bed: Needs assistance ?Is this a change from baseline?: Pre-admission baseline ?Walks in Home: Needs assistance ?Is this a change from baseline?: Pre-admission baseline ?Does the patient have difficulty walking or climbing stairs?: Yes ?Weakness of Legs: Both ?Weakness of Arms/Hands: Both ? ?Permission Sought/Granted ?  ?  ?   ?   ?   ?   ? ?Emotional Assessment ?Appearance:: Appears stated age ?Attitude/Demeanor/Rapport: Engaged ?Affect (typically observed): Accepting ?Orientation: : Oriented to Self, Oriented to Place, Oriented to  Time, Oriented to Situation ?Alcohol / Substance Use: Not Applicable ?Psych Involvement: No (comment) ? ?Admission diagnosis:  SOB (shortness of breath) [R06.02] ?Hypoxia [R09.02] ?Acute respiratory failure with hypoxia ([place]) [J96.01] ?Acute pericarditis, unspecified type [I30.9] ?Patient Active Problem List  ? Diagnosis Date Noted  ? Protein-calorie malnutrition, severe 01/07/2022  ?  Acute respiratory failure with hypoxia ([place]) 01/05/2022  ? Type 2 diabetes mellitus with ketosis ([place]) 01/05/2022  ? Pericardial effusion 01/05/2022  ? Pressure injury of skin 01/05/2022  ? Abdominal fluid collection 12/19/2021  ? Encounter for dental examination  09/12/2021  ? Teeth missing 09/12/2021  ? Caries 09/12/2021  ? Defective dental restoration 09/12/2021  ? Retained tooth root 09/12/2021  ? Chronic periodontitis 09/12/2021  ? Accretions on teeth 09/12/2021  ? Oral mucositis 09/12/2021  ? Ill-fitting dentures 09/12/2021  ? Wound of foot 09/10/2021  ? Closed displaced fracture of proximal phalanx of finger of left hand 06/12/2021  ? AKI (acute kidney injury) ([place]) 05/12/2021  ? Abdominal distension   ? Gastroparesis   ? Gastric outlet obstruction 05/05/2021  ? Abdominal pain 05/05/2021  ? Dehydration 05/05/2021  ? Nausea & vomiting 05/04/2021  ? Port-A-Cath in place 04/24/2021  ? Malnutrition of moderate degree 03/27/2021  ? Hyperglycemia due to diabetes mellitus ([place]) 03/26/2021  ? Pancreatic cancer ([place]) 03/12/2021  ? Malignant neoplasm of head of pancreas ([place]) 03/12/2021  ? Jaundice 02/26/2021  ? Loss of weight 02/26/2021  ? 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 Hypertension associated with type 2 diabetes mellitus ([place]) 11/28/2020  ? Type 2 diabetes mellitus with diabetic neuropathy, unspecified ([place]) 11/28/2020  ? Hyperlipidemia associated with type 2 diabetes mellitus ([place]) 11/28/2020  ? GERD without esophagitis 11/28/2020  ? Major depression, recurrent, chronic ([place]) 11/28/2020  ? Aortic atherosclerosis ([place]) 11/28/2020  ? Diabetic neuropathy ([place]) 11/28/2020  ? Osteomyelitis ([place]) 11/14/2020  ? Sepsis ([place]) 10/28/2020  ? Community acquired pneumonia of right lower lobe of lung   ? Elevated LFTs   ? Hypophosphatemia   ? Acute metabolic encephalopathy   ? Acute maxillary sinusitis   ? Depression   ? Physical deconditioning   ? Pneumonia 05/14/2020  ? Hyponatremia 05/13/2020  ? Hypokalemia 05/13/2020  ? SIRS (systemic inflammatory response syndrome) ([place]) 05/13/2020  ? Alcohol abuse with alcohol-induced mood disorder ([place]) 12/17/2017  ? Hereditary hemochromatosis ([place]) 11/23/2017  ? Carrier of hemochromatosis HFE gene mutation  08/30/2017  ? Essential hypertension 04/02/2017  ? Hypogonadism male 08/13/2016  ? ETOH abuse 06/22/2016  ? Low serum testosterone level 01/08/2016  ? Type 2 diabetes mellitus with other specified complication ([place]) 66/59/9357  ? Erectile dysfunction 09/12/2015  ? Hepatic cirrhosis ([place]) 01/10/2015  ? Chronic hepatitis C without hepatic coma ([place]) 10/31/2014  ? Tobacco use disorder 08/03/2013  ? Diabetes type 2, controlled ([place]) 04/27/2013  ? ?PCP:  [name], NP ?Pharmacy:   ?[facility] [address], [address] ?[place] [address] ?Phone: [phone] Fax: [phone] ? ?[facility] ?[address] ?[place] [address] ?Phone: [phone] Fax: [phone] ? ? ? ? ?Social Determinants of Health (SDOH) Interventions ?  ? ?Readmission Risk Interventions ? ?  03/27/2021  ? 10:04 AM 11/14/2020  ? 10:11 AM  ?Readmission Risk Prevention Plan  ?Transportation Screening Complete Complete  ?PCP or Specialist Appt within 3-5 Days Complete   ?[place] or Home Care Consult Complete Complete  ?Social Work Consult for [place] Planning/Counseling Complete Complete  ?Palliative Care Screening Not Applicable Not Applicable  ?Medication Review Press photographer) Complete Complete  ? ? ? ?

## 2022-01-07 NOTE — Progress Notes (Signed)
Went over discharge instructions w/ pt. Pt verbalized understanding.  

## 2022-01-07 NOTE — Progress Notes (Signed)
Physical Therapy Treatment ?Patient Details ?Name: [name] [name] ?MRN: [id number] ?DOB: [birth year] ?Today's Date: 01/07/2022 ? ? ?History of Present Illness 61 yo male admitted with acute respiratory failure, pericardial effusion. 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 ?  ?PT Comments  ? ? Pt demonstrated Modified independence with bed mobility and transfers. No LOB with ambulation on level surface and changes in direction despite being unsteady at times. Pt has a prosthetic shoe with toe filler due to past amputation. However, he does not wear it often and may be contributing to falls.  Will plan to continue PT per POC.  ?Recommendations for follow up therapy are one component of a multi-disciplinary discharge planning process, led by the attending physician.  Recommendations may be updated based on patient status, additional functional criteria and insurance authorization. ? ?Follow Up Recommendations ? No PT follow up ?  ?  ?Assistance Recommended at Discharge PRN  ?Patient can return home with the following Assistance with cooking/housework ?  ?Equipment Recommendations ? None recommended by PT  ?  ?Recommendations for Other Services   ? ? ?  ?Precautions / Restrictions Precautions ?Precautions: Fall ?Precaution Comments: patient reports he falls "once a week" ?Restrictions ?Weight Bearing Restrictions: No  ?  ? ?Mobility ? Bed Mobility ?Overal bed mobility: Modified Independent ?  ?  ?  ?  ?  ?  ?  ?  ? ?Transfers ?Overall transfer level: Modified independent ?  ?  ?  ?  ?  ?  ?  ?  ?  ?  ? ?Ambulation/Gait ?Ambulation/Gait assistance: Min guard ?Gait Distance (Feet): 250 Feet ?Assistive device: None ?Gait Pattern/deviations: Step-through pattern ?  ?  ?  ?General Gait Details: Occasional mis-step without LOB, able to self correct ? ? ?Stairs ?  ?  ?  ?  ?  ? ? ?Wheelchair Mobility ?  ? ?Modified Rankin (Stroke Patients Only) ?  ? ? ?  ?Balance Overall  balance assessment: History of Falls, Mild deficits observed, not formally tested ?  ?  ?  ?  ?  ?  ?  ?  ?  ?  ?  ?  ?  ?  ?  ?  ?  ?  ?  ? ?  ?Cognition Arousal/Alertness: Awake/alert ?Behavior During Therapy: [facility] for tasks assessed/performed ?Overall Cognitive Status: Within Functional Limits for tasks assessed ?  ?  ?  ?  ?  ?  ?  ?  ?  ?  ?  ?  ?  ?  ?  ?  ?  ?  ?  ? ?  ?Exercises   ? ?  ?General Comments General comments (skin integrity, edema, etc.): Pt has shoe insert for balance when walking due to past transmetatarsal amp. which he does not wear ?  ?  ? ?Pertinent Vitals/Pain Pain Assessment ?Pain Assessment: No/denies pain  ? ? ?Home Living Family/patient expects to be discharged to:: Private residence ?Living Arrangements: Alone ?Available Help at Discharge: Friend(s);Available PRN/intermittently ?Type of Home: House ?Home Access: Stairs to enter ?Entrance Stairs-Rails: Right ?Entrance Stairs-Number of Steps: 3 ?  ?Home Layout: One level ?Home Equipment: Grab bars - tub/shower;Rolling Walker (2 wheels);Cane - single point;Wheelchair - manual ?   ?  ?Prior Function    ?  ?  ?   ? ?PT Goals (current goals can now be found in the care plan section)   ? ?  ?Frequency ? ? ?  Min 3X/week ? ? ? ?  ?PT Plan Current plan remains appropriate  ? ? ?Co-evaluation   ?  ?  ?  ?  ? ?  ?AM-PAC PT "6 Clicks" Mobility   ?Outcome Measure ? Help needed turning from your back to your side while in a flat bed without using bedrails?: None ?Help needed moving from lying on your back to sitting on the side of a flat bed without using bedrails?: None ?Help needed moving to and from a bed to a chair (including a wheelchair)?: A Little ?Help needed standing up from a chair using your arms (e.g., wheelchair or bedside chair)?: A Little ?Help needed to walk in hospital room?: A Little ?Help needed climbing 3-5 steps with a railing? : A Little ?6 Click Score: 20 ? ?  ?End of Session Equipment Utilized During Treatment: Gait  belt ?Activity Tolerance: Patient tolerated treatment well ?Patient left: in bed;with call bell/phone within reach;with bed alarm set ?Nurse Communication: Mobility status ?PT Visit Diagnosis: Unsteadiness on feet (R26.81);History of falling (Z91.81);Repeated falls (R29.6);Muscle weakness (generalized) (M62.81) ?  ? ? ?Time: 3491-7915 ?PT Time Calculation (min) (ACUTE ONLY): 14 min ? ?Charges:  $Gait Training: 8-22 mins          ?Mikel Cella, PTA ? ? ? ?[name] ?01/07/2022, 2:25 PM ? ?

## 2022-01-07 NOTE — Progress Notes (Signed)
? ?Progress Note ? ?Patient Name: [name] [name] ?Date of Encounter: 01/07/2022 ? ?Primary Cardiologist: New to [facility]  ? ?Subjective  ? ?Elevated MEWS overnight for tachycardia. ?No CP, SOB, palpitations.  ?Patient feels well; his [place] is still to get his strength up and get back to 5000 steps a day. ? ?Inpatient Medications  ?  ?Scheduled Meds: ? amoxicillin-clavulanate  1 tablet Oral BID  ? aspirin EC  81 mg Oral Daily  ? budesonide (PULMICORT) nebulizer solution  0.5 mg Nebulization BID  ? Chlorhexidine Gluconate Cloth  6 each Topical Daily  ? DULoxetine  60 mg Oral Daily  ? feeding supplement  237 mL Oral BID BM  ? fluconazole  400 mg Oral Daily  ? insulin aspart  0-15 Units Subcutaneous TID WC  ? insulin NPH Human  20 Units Subcutaneous QAC breakfast  ? lipase/protease/amylase  24,000 Units Oral TID with meals  ? mirtazapine  30 mg Oral QHS  ? oxyCODONE  10 mg Oral Q12H  ? pantoprazole  40 mg Oral Daily  ? polyethylene glycol  17 g Oral BID  ? sodium chloride flush  10-40 mL Intracatheter Q12H  ? Zinc Oxide   Topical BID  ? ?Continuous Infusions: ? sodium chloride    ? ?PRN Meds: ?acetaminophen **OR** acetaminophen, albuterol, lipase/protease/amylase, ondansetron **OR** ondansetron (ZOFRAN) IV, oxyCODONE, polyethylene glycol powder  ? ?Vital Signs  ?  ?Vitals:  ? 01/06/22 1924 01/06/22 2039 01/07/22 0520 01/07/22 0732  ?BP:  111/67 104/64 99/64  ?Pulse:  (!) 110 (!) 111 99  ?Resp:  18  19  ?Temp:  99.1 ?F (37.3 ?C) 98.4 ?F (36.9 ?C) 98.3 ?F (36.8 ?C)  ?TempSrc:  Oral Oral Oral  ?SpO2: 94% 96% 93% 95%  ?Weight:      ?Height:      ? ? ?Intake/Output Summary (Last 24 hours) at 01/07/2022 0811 ?Last data filed at 01/07/2022 0600 ?Gross per 24 hour  ?Intake 720 ml  ?Output 25 ml  ?Net 695 ml  ? ?Filed Weights  ? 01/05/22 1632  ?Weight: 57.6 kg  ? ? ?Telemetry  ?  ?Sinus tachycardia - Personally Reviewed ? ?Physical Exam  ? ?Gen: no distress, thin cachetic male   ?Neck: No JVD at 45 degrees ?Ears: Bilateral  Pilar Plate Sign ?Cardiac: No Rubs or Gallops, no Murmur, Regular tachycardia, +2 radial pulses ?Respiratory: Clear to auscultation bilaterally, normal effort, normal  respiratory rate ?GI: Soft, nontender with drain ?MS: No  edema;  moves all extremities ?Integument: Stable TMA ulcer ?Neuro:  At time of evaluation, alert and oriented to person/place/time/situation  ?Psych: Normal affect, patient feels optimistic ? ? ?Labs  ?  ?Chemistry ?Recent Labs  ?Lab 01/05/22 ?1117 01/06/22 ?1856  ?NA 130* 133*  ?K 4.0 3.4*  ?CL 91* 96*  ?CO2 20* 31  ?GLUCOSE 295* 102*  ?BUN 7 6  ?CREATININE 0.59* 0.37*  ?CALCIUM 8.9 8.9  ?PROT 8.0 6.6  ?ALBUMIN 3.1* 2.5*  ?AST 27 20  ?ALT 17 12  ?ALKPHOS 187* 145*  ?BILITOT 1.6* 0.4  ?GFRNONAA >60 >60  ?ANIONGAP 19* 6  ?  ? ?Hematology ?Recent Labs  ?Lab 01/05/22 ?1117 01/06/22 ?3149  ?WBC 11.9* 9.4  ?RBC 3.85* 3.52*  ?HGB 11.0* 10.1*  ?HCT 33.8* 30.5*  ?MCV 87.8 86.6  ?MCH 28.6 28.7  ?MCHC 32.5 33.1  ?RDW 14.9 15.2  ?PLT 338 261  ? ? ?Cardiac EnzymesNo results for input(s): TROPONINI in the last 168 hours. No results for input(s): TROPIPOC in the last  168 hours.  ? ?BNP ?Recent Labs  ?Lab 01/05/22 ?1119  ?BNP 54.1  ?  ? ?DDimer No results for input(s): DDIMER in the last 168 hours.  ? ?Radiology  ?  ?DG Chest 2 View ? ?Result Date: 01/05/2022 ?CLINICAL DATA:  61 year old male with shortness of breath and right lower chest pain since yesterday. Status post Whipple in March. EXAM: CHEST - 2 VIEW COMPARISON:  [facility] portable chest 11/02/2021 and earlier. FINDINGS: PA and lateral views. Larger lung volumes. Stable right chest power port. Normal cardiac size and mediastinal contours. Visualized tracheal air column is within normal limits. Both lungs appear clear. No pneumothorax or pleural effusion. Partially visible surgical clips in the right upper quadrant. Negative visible bowel gas. No acute osseous abnormality identified. IMPRESSION: No acute cardiopulmonary abnormality. Electronically Signed   By: Genevie Ann M.D.   On: 01/05/2022 10:05  ? ?CT Angio Chest PE W and/or Wo Contrast ? ?Result Date: 01/05/2022 ?CLINICAL DATA:  Pulmonary embolism (PE) suspected, high prob Known cancer, recent procedure, tachycardia, tachypnea, worsened exertional shortness of breath. Rule out PE versus recurrent abscess below his diaphragm EXAM: CT ANGIOGRAPHY CHEST WITH CONTRAST TECHNIQUE: Multidetector CT imaging of the chest was performed using the standard protocol during bolus administration of intravenous contrast. Multiplanar CT image reconstructions and MIPs were obtained to evaluate the vascular anatomy. RADIATION DOSE REDUCTION: This exam was performed according to the departmental dose-optimization program which includes automated exposure control, adjustment of the mA and/or kV according to patient size and/or use of iterative reconstruction technique. CONTRAST:  156m OMNIPAQUE IOHEXOL 350 MG/ML SOLN COMPARISON:  CT 12/25/2021. FINDINGS: Cardiovascular: Satisfactory opacification of the pulmonary arteries to the segmental level. No evidence of pulmonary embolism. Normal cardiac size.Small pericardial effusion, increased since 12/25/2021. Moderate atherosclerosis of the thoracic aorta. Chest port catheter tip terminates in the right atrium. Mediastinum/Nodes: No lymphadenopathy. Lungs/Pleura: No focal airspace disease. Left basilar scarring/linear atelectasis. No suspicious pulmonary nodules. Apical centrilobular and paraseptal emphysema. Upper Abdomen: No acute abnormality. Musculoskeletal: No chest wall abnormality. No acute or significant osseous findings. Review of the MIP images confirms the above findings. IMPRESSION: Increased, small pericardial effusion in comparison to prior. No pulmonary embolism or focal airspace consolidation. Apical centrilobular and paraseptal emphysema. Electronically Signed   By: JMaurine SimmeringM.D.   On: 01/05/2022 12:53  ? ?CT ABDOMEN PELVIS W CONTRAST ? ?Result Date: 01/05/2022 ?CLINICAL DATA:   Postop abdominal pain, evaluate for abscess. Pancreatic adenocarcinoma. * Tracking Code: BO * EXAM: CT ABDOMEN AND PELVIS WITH CONTRAST TECHNIQUE: Multidetector CT imaging of the abdomen and pelvis was performed using the standard protocol following bolus administration of intravenous contrast. RADIATION DOSE REDUCTION: This exam was performed according to the departmental dose-optimization program which includes automated exposure control, adjustment of the mA and/or kV according to patient size and/or use of iterative reconstruction technique. CONTRAST:  766mOMNIPAQUE IOHEXOL 300 MG/ML  SOLN COMPARISON:  CT chest 01/05/2022, CT abdomen pelvis 12/25/2021. FINDINGS: Lower chest: Minimal subsegmental atelectasis in the lung bases. Heart size normal. Small pericardial effusion with hyperattenuation of the pericardial border. Atherosclerotic calcification of the aorta and coronary arteries. No pleural fluid. Distal esophagus is grossly unremarkable. Hepatobiliary: Liver is slightly decreased in attenuation diffusely. Loculated perihepatic fluid collection has decreased in size along the anterolateral aspect of the liver, now with the largest pocket measuring 1.2 x 3.1 cm (2/31). Indwelling percutaneous drain. Cholecystectomy. No biliary ductal dilatation. Pancreas: Pancreaticoduodenectomy. Slight pancreatic ductal dilatation in the body, 4 mm, similar. Remainder  of the gland is unremarkable. Spleen: Negative. Adrenals/Urinary Tract: Adrenal glands are unremarkable. Millimetric low-attenuation lesions in the right kidney are too small to characterize. No follow-up necessary. Kidneys are otherwise unremarkable. Ureters are decompressed. Bladder is grossly unremarkable. Stomach/Bowel: Stomach is unremarkable. Pancreaticoduodenectomy. Small bowel is otherwise unremarkable. Appendix is not readily appreciated. Fair amount of stool in the colon. Vascular/Lymphatic: Atherosclerotic calcification of the aorta. No  pathologically enlarged lymph nodes. Reproductive: Prostate is visualized. Other: No free fluid.  Mesenteries and peritoneum are unremarkable. Musculoskeletal: Degenerative changes in the spine. No worrisome lytic or

## 2022-01-07 NOTE — Discharge Summary (Signed)
?Physician Discharge Summary ?  ?Patient: [name] [name] MRN: [id number] DOB: 11-24-1960  ?Admit date:     01/05/2022  ?Discharge date: 01/07/22  ?Discharge Physician: Oswald Hillock  ? ?PCP: [name], NP  ? ?Recommendations at discharge:  ? ?Follow-up cardiology in 1 week   ?Will order home health PT/OT, social work, home health aide ? ?Discharge Diagnoses: ?Principal Problem: ?  Acute respiratory failure with hypoxia ([place]) ?Active Problems: ?  Tobacco use disorder ?  Essential hypertension ?  Elevated LFTs ?  Hyperlipidemia associated with type 2 diabetes mellitus ([place]) ?  Diabetic neuropathy ([place]) ?  Type 2 diabetes mellitus with ketosis ([facility]) ?  Abdominal fluid collection ?  Pericardial effusion ?  Pressure injury of skin ?  Protein-calorie malnutrition, severe ? ?Resolved Problems: ?  * No resolved hospital problems. * ? ?Hospital Course: ? ? 61 y.o. 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 coli comes into the emergency room due to fevers generalized weakness and shortness of breath since yesterday.  No PND. ?Satting 99% on room air pulse of 121, specific gravity is increased 500 ketones anion gap of 19 bilirubin 1.6 white count of 12 CT of the chest showed small pericardial effusion compared to prior no PE or airspace disease, did show emphysema. ?CT abdomen showed decrease loculated capsular fluid along the liver margin with percutaneous drains in place. ?  ?Assessment and Plan: ? ?Acute respiratory failure with hypoxia ([place]) ?-Resolved ?-Unclear etiology, he does  emphysema seen on CT chest ?-CT chest was negative for PE ?-Patient is no short of breath, not requiring oxygen ?  ?Moderate pericardial effusion: ?-Unclear etiology, idiopathic versus malignant versus pericarditis ?Blood pressure stable unlikely the cause of his hypoxia. ?2D echo showed an EF of 55% with grade 1 diastolic heart failure moderate pericardial effusion no evidence of tamponade. ?He was given a liter and a half in the ED. ?Pericardial effusion has progressively gotten bigger. ?Cardiology was consulted ?-Cardiology wants to repeat limited echo in 1 week as outpatient, if continues to have shoulder combination without tamponade and need for diagnostic, diagnostic pericardiocentesis would be considered ? ?High anion gap metabolic acidosis: ?Resolved. ?  ?Abdominal fluid collection: ?Has no leukocytosis has remained afebrile. ?CT showed decreased loculated subcapsular fluid along the liver margin with percutaneous drain in place. ?-Patient to continue Augmentin and fluconazole till 01/09/2022 ?-He has an appointment at [facility] on Friday, 01/09/2022 ?  ?Hypovolemic hyponatremia: ?Fluid resuscitated now improved. ? ?Hypokalemia ?-Potassium replaced ?  ?Diabetes mellitus type 2 with ketosis: ?Continue home regimen ?  ?Tobacco use disorder: ?Counseling about more consideration. ? ?Essential hypertension: ?Has been taken off his antihypertensive medication after significant weight loss. ? ?Elevated LFTs: ?-Resolved ? ?Hyperlipidemia associated with diabetes mellitus: ?Not on a statin. ? ?Diabetic neuropathy: ?Continue duloxetine. ? ?  ? ? ?Consultants: Cardiology ?Procedures performed:  ?Disposition: Home ?Diet recommendation:  ?Discharge Diet Orders (From admission, onward)  ? ?  Start     Ordered  ? 01/07/22 0000  Diet - low sodium heart healthy       ? 01/07/22 1450  ? ?  ?  ? ?  ? ?  Cardiac diet ?DISCHARGE MEDICATION: ?Allergies as of 01/07/2022   ? ?   Reactions  ? Bee Venom Anaphylaxis  ? Lactose Intolerance (gi)  Other (See Comments)  ? GI upset  ? ?  ? ?  ?Medication List  ?  ? ?STOP taking these medications   ? ?moxifloxacin 400 MG tablet ?Commonly known as: AVELOX ?  ? ?  ? ?TAKE these medications   ? ?Accu-Chek Guide test strip ?Generic drug: glucose blood ?Check blood glucose level by fingerstick 3 - 4 times per day. ?  ?Accu-Chek Guide w/Device Kit ?Check blood glucose level by fingerstick 3-4 times per day. ?  ?Accu-Chek Softclix Lancets lancets ?Check blood glucose level by fingerstick 3-4 times per day. ?  ?acetaminophen 500 MG tablet ?Commonly known as: TYLENOL ?Take 500 mg by mouth every 6 (six) hours as needed for moderate pain. ?  ?amoxicillin-clavulanate 875-125 MG tablet ?Commonly known as: AUGMENTIN ?Take 1 tablet by mouth 2 (two) times daily for 2 days. ?  ?aspirin 81 MG EC tablet ?Take 1 tablet by mouth daily. ?  ?atorvastatin 20 MG tablet ?Commonly known as: LIPITOR ?Take 1 tablet (20 mg total) by mouth daily. ?  ?chlorhexidine 0.12 % solution ?Commonly known as: PERIDEX ?Rinse between teeth using irrigation syringe (15 mL) 2-3 times a day or as needed for gum irritation. ?What changed:  ?how much to take ?how to take this ?when to take this ?reasons to take this ?additional instructions ?  ?diphenoxylate-atropine 2.5-0.025 MG tablet ?Commonly known as: Lomotil ?Take 1 tablet by mouth 4 (four) times daily as needed for diarrhea or loose stools. ?  ?DULoxetine 60 MG capsule ?Commonly known as: CYMBALTA ?Take 1 capsule (60 mg total) by mouth daily. ?  ?EPINEPHrine 0.3 mg/0.3 mL Soaj injection ?Commonly known as: EPI-PEN ?Inject 0.3 mg into the muscle once as needed for anaphylaxis. ?  ?fluconazole 200 MG tablet ?Commonly known as: DIFLUCAN ?Take 2 tablets (400 mg total) by mouth daily. ?Start taking on: Jan 09, 3535 ?  ?folic acid 1 MG tablet ?Commonly known as: FOLVITE ?Take 1 tablet (1 mg total) by mouth in the morning. ?  ?FreeStyle Libre 14 Day Sensor Misc ?Use as instructed, Check blood glucose level by  fingerstick 4 - 5 times per day. ?  ?FreeStyle [place] 2 Reader Kerrin Mo ?Use as instructed. Check blood glucose level by fingerstick 4-5 per day. E11.65 ?  ?HumuLIN N KwikPen 100 UNIT/ML Kiwkpen ?Generic drug: Insulin NPH (Human) (Isophane) ?Inject 35 Units into the skin every morning. ?What changed: when to take this ?  ?lidocaine-prilocaine cream ?Commonly known as: EMLA ?Apply 1 application topically as directed as needed. ?  ?lipase/protease/amylase 12000-38000 units Cpep capsule ?Commonly known as: CREON ?Take 36,000-72,000 Units by mouth See admin instructions. Take 2 capsules by mouth daily with meals and may take 1 capsule by mouth as needed with snakes. ?What changed: Another medication with the same name was removed. Continue taking this medication, and follow the directions you see here. ?  ?losartan 50 MG tablet ?Commonly known as: COZAAR ?Take 1 tablet (50 mg total) by mouth daily. ?  ?metoCLOPramide 5 MG tablet ?Commonly known as: Reglan ?Take 1 tablet (5 mg total) by mouth 4 (four) times daily -  before meals and at bedtime. ?  ?mirtazapine 30 MG tablet ?Commonly known as: REMERON ?Take 1 tablet (30 mg total) by mouth at bedtime. ?  ?nicotine 14 mg/24hr patch ?Commonly known as: NICODERM CQ - dosed in mg/24 hours ?Apply one  patch daily x 12 weeks. ?  ?ondansetron 8 MG tablet ?Commonly known as: Zofran ?Take 1 tablet (8 mg total) by mouth 2 (two) times daily as needed. Start on day 3 after chemotherapy. ?What changed:  ?reasons to take this ?additional instructions ?  ?oxyCODONE 5 MG immediate release tablet ?Commonly known as: Oxy IR/ROXICODONE ?Take 1-2 tablets (5-10 mg total) by mouth every 6 (six) hours as needed for severe pain. ?  ?pantoprazole 40 MG tablet ?Commonly known as: PROTONIX ?Take 1 tablet (40 mg total) by mouth daily. ?  ?polyethylene glycol powder 17 GM/SCOOP powder ?Commonly known as: GLYCOLAX/MIRALAX ?Take 17 g by mouth at bedtime. ?What changed:  ?when to take this ?reasons to take  this ?  ?prochlorperazine 10 MG tablet ?Commonly known as: COMPAZINE ?Take 1 tablet (10 mg total) by mouth every 6 (six) hours as needed for nausea or vomiting. ?What changed: reasons to take this ?  ?[place]

## 2022-01-07 NOTE — Progress Notes (Signed)
Initial Nutrition Assessment ? ?DOCUMENTATION CODES:  ? ?Severe malnutrition in context of chronic illness, Underweight ? ?INTERVENTION:  ? ?-Ensure Plus High Protein po TID, each supplement provides 350 kcal and 20 grams of protein.  ? ?-Multivitamin with minerals daily ? ?-1 packet Juven BID, each packet provides 95 calories, 2.5 grams of protein (collagen), and 9.8 grams of carbohydrate (3 grams sugar); also contains 7 grams of L-arginine and L-glutamine, 300 mg vitamin C, 15 mg vitamin E, 1.2 mcg vitamin B-12, 9.5 mg zinc, 200 mg calcium, and 1.5 g  Calcium Beta-hydroxy-Beta-methylbutyrate to support wound healing  ? ?-Double protein portions with meals ? ?-Placed "High Calorie, High Protein" handout in discharge instructions ? ? ?NUTRITION DIAGNOSIS:  ? ?Severe Malnutrition related to chronic illness, cancer and cancer related treatments as evidenced by severe fat depletion, severe muscle depletion, percent weight loss. ? ?GOAL:  ? ?Patient will meet greater than or equal to 90% of their needs ? ?MONITOR:  ? ?PO intake, Supplement acceptance, Labs, Weight trends, I & O's ? ?REASON FOR ASSESSMENT:  ? ?Malnutrition Screening Tool ?  ? ?ASSESSMENT:  ? ?61 y.o. 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 coli comes into the emergency room due to fevers generalized weakness and shortness of breath since yesterday. ? ?Patient reports his appetite has been poor and he did not eat anything for a week PTA. He only took some medications. Pt states he is usually compliant with his Creon. Pt  states he has had frequent falls and cooking is more difficult. He uses protein supplements and protein powders when he remembers to use them.  ?This morning pt ate oatmeal and a blueberry muffin. Plans to drink all Ensures provided to him. Will add Juven supplements for wound healing. Pt would like the option to have double protein portions, will add in Healthtouch. ? ?Denies swallowing issues. When asked about chewing difficulty, he states he has partials that bother him sometimes. ? ?Will place handout on high calorie, high protein diet in discharge instructions. ? ?Per weight records, pt has lost 20 lbs since 10/22/21 (13% wt loss x 2.5 months, significant for time frame).  ? ?Medications: Creon, Remeron, Miralax ? ?Labs reviewed: ?CBGs; 75-258  ? ?NUTRITION - FOCUSED PHYSICAL EXAM: ? ?Flowsheet Row Most Recent Value  ?Orbital Region Mild depletion  ?Upper Arm Region Severe depletion  ?Thoracic and Lumbar Region Severe depletion  ?Buccal Region Moderate depletion  ?Temple Region Moderate depletion  ?Clavicle Bone Region Severe depletion  ?Clavicle and Acromion Bone Region Severe depletion  ?Scapular Bone Region Severe depletion  ?Dorsal Hand Severe depletion  ?Patellar Region Severe depletion  ?Anterior Thigh Region Severe depletion  ?Posterior Calf Region Severe depletion  ?Edema (RD Assessment) None  ?Hair Unable to assess  [no hair]  ?Eyes Reviewed  ?Mouth Reviewed  [reports having partials that bother him at times]  ?Skin Reviewed  ?Nails Reviewed  ? ?  ? ? ?Diet Order:   ?Diet Order   ? ?       ?  Diet heart healthy/carb modified Room service appropriate? Yes; Fluid consistency: Thin  Diet effective now       ?  ? ?  ?  ? ?  ? ? ?  EDUCATION NEEDS:  ? ?Education needs have been addressed ? ?Skin:  Skin Assessment: Skin Integrity Issues: ?Skin Integrity Issues:: Diabetic Ulcer, Stage I ?Stage I: mid-coccyx ?Diabetic Ulcer: left foot ? ?Last BM:  5/8 ? ?Height:  ? ?Ht Readings from Last 1 Encounters:  ?01/05/22  '5\' 11"'$  (1.803 m)  ? ? ?Weight:  ? ?Wt Readings from Last 1 Encounters:  ?01/05/22 57.6 kg  ? ? ?BMI:  Body mass index is 17.71 kg/m?. ? ?Estimated Nutritional Needs:  ? ?Kcal:  2000-2200 ? ?Protein:  110-125g ? ?Fluid:  2L/day ? ? ?Clayton Bibles, MS, RD, LDN ?Inpatient Clinical Dietitian ?Contact information available via Amion ? ?

## 2022-01-08 ENCOUNTER — Telehealth: Payer: Self-pay

## 2022-01-08 ENCOUNTER — Other Ambulatory Visit: Payer: Self-pay

## 2022-01-08 ENCOUNTER — Other Ambulatory Visit (HOSPITAL_COMMUNITY): Payer: Self-pay

## 2022-01-08 ENCOUNTER — Encounter: Payer: Self-pay | Admitting: Hematology and Oncology

## 2022-01-08 MED ORDER — FLUCONAZOLE 200 MG PO TABS
ORAL_TABLET | ORAL | 0 refills | Status: DC
  Filled 2022-01-08: qty 4, 2d supply, fill #0

## 2022-01-08 NOTE — Telephone Encounter (Signed)
Hello good people!!  Any word on the freestyle libre? ?

## 2022-01-08 NOTE — Telephone Encounter (Signed)
Transition Care Management Follow-up Telephone Call ?Date of discharge and from where: 01/07/2022, [facility]  ?How have you been since you were released from the hospital? He said he feels " yucky" but better than he did when he was admitted to the hospital ?Any questions or concerns? No ? ?Items Reviewed: ?Did the pt receive and understand the discharge instructions provided? Yes  ?Medications obtained and verified?  He has all medications except the fluconazole which he hopes to have delivered from the pharmacy.  He has a working Surveyor, minerals and is waiting for the order for the [employer] to be processed.  ?Other? No  ?Any new allergies since your discharge? No  ?Dietary orders reviewed? Yes ?Do you have support at home?  Lives alone. Said he does not have anyone in the area to help him,  ? ?Home Care and Equipment/Supplies: ?Were home health services ordered? no ?If so, what is the name of the agency? N/a  ?Has the agency set up a time to come to the patient's home? not applicable ?Were any new equipment or medical supplies ordered?  No ?What is the name of the medical supply agency? N/a ?Were you able to get the supplies/equipment? not applicable ?Do you have any questions related to the use of the equipment or supplies? No ? ?He said that he does his own wound care for left foot and he has all necessary supplies. He also is seen at the wound care clinic. ?He has been referred to outpatient PT.  ?Functional Questionnaire: (I = Independent and D = Dependent) ?ADLs: has walker and wheelchair for mobility. Independent with personal care ? ? ?Follow up appointments reviewed: ? ?PCP Hospital f/u appt confirmed? Yes  Scheduled to see [name], NP - 02/04/2022.  ?[facility] f/u appt confirmed? Yes  Scheduled to see Echo- 01/12/2022; endocrinology- 01/14/2022 and oncology- 01/20/2022.  ?Are transportation arrangements needed? No  - he arranges rides with his insurance company ?If their condition  worsens, is the pt aware to call PCP or go to the Emergency Dept.? Yes ?Was the patient provided with contact information for the PCP's office or ED? Yes ?Was to pt encouraged to call back with questions or concerns? Yes ? ?

## 2022-01-09 ENCOUNTER — Other Ambulatory Visit: Payer: Self-pay

## 2022-01-09 ENCOUNTER — Other Ambulatory Visit (HOSPITAL_COMMUNITY): Payer: Self-pay

## 2022-01-09 NOTE — Telephone Encounter (Signed)
[place] friend,  ? ?Are we able to start a PA for the [facility]? Not sure if [place] Medicaid prefers Dexcom or [place]. I think both are on the PDL.  ?

## 2022-01-10 ENCOUNTER — Other Ambulatory Visit (HOSPITAL_COMMUNITY): Payer: Self-pay

## 2022-01-10 LAB — CULTURE, BLOOD (ROUTINE X 2)
Culture: NO GROWTH
Culture: NO GROWTH
Special Requests: ADEQUATE
Special Requests: ADEQUATE

## 2022-01-12 ENCOUNTER — Ambulatory Visit (HOSPITAL_COMMUNITY): Payer: Medicaid Other | Attending: Cardiovascular Disease

## 2022-01-12 ENCOUNTER — Encounter (HOSPITAL_BASED_OUTPATIENT_CLINIC_OR_DEPARTMENT_OTHER): Payer: Medicaid Other | Admitting: General Surgery

## 2022-01-12 ENCOUNTER — Encounter: Payer: Self-pay | Admitting: Radiation Oncology

## 2022-01-12 DIAGNOSIS — I3139 Other pericardial effusion (noninflammatory): Secondary | ICD-10-CM | POA: Insufficient documentation

## 2022-01-12 DIAGNOSIS — E11621 Type 2 diabetes mellitus with foot ulcer: Secondary | ICD-10-CM | POA: Diagnosis not present

## 2022-01-12 LAB — ECHOCARDIOGRAM LIMITED
Area-P 1/2: 5.2 cm2
S' Lateral: 2.1 cm
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
## 2022-01-12 NOTE — Progress Notes (Signed)
Plans for proceeding with chemoradiation remain on hold due to ongoing infection in the postoperative site. He still has an in situ drain in the pancreatic fossa. He sees Dr. Hyman Hopes on 01/30/22 and we will check in at that point to see if we can move forward.  ?

## 2022-01-13 ENCOUNTER — Emergency Department (HOSPITAL_COMMUNITY): Payer: Medicaid Other

## 2022-01-13 ENCOUNTER — Other Ambulatory Visit: Payer: Self-pay

## 2022-01-13 ENCOUNTER — Emergency Department (HOSPITAL_COMMUNITY)
Admission: EM | Admit: 2022-01-13 | Discharge: 2022-01-13 | Disposition: A | Discharge status: Other Acute Inpatient Hospital | Payer: Medicaid Other | Attending: Emergency Medicine | Admitting: Emergency Medicine

## 2022-01-13 ENCOUNTER — Encounter (HOSPITAL_COMMUNITY): Payer: Self-pay | Admitting: Radiology

## 2022-01-13 DIAGNOSIS — I10 Essential (primary) hypertension: Secondary | ICD-10-CM | POA: Insufficient documentation

## 2022-01-13 DIAGNOSIS — Z7982 Long term (current) use of aspirin: Secondary | ICD-10-CM | POA: Insufficient documentation

## 2022-01-13 DIAGNOSIS — Z794 Long term (current) use of insulin: Secondary | ICD-10-CM | POA: Diagnosis not present

## 2022-01-13 DIAGNOSIS — Z79899 Other long term (current) drug therapy: Secondary | ICD-10-CM | POA: Insufficient documentation

## 2022-01-13 DIAGNOSIS — E1165 Type 2 diabetes mellitus with hyperglycemia: Secondary | ICD-10-CM | POA: Insufficient documentation

## 2022-01-13 DIAGNOSIS — K7689 Other specified diseases of liver: Secondary | ICD-10-CM | POA: Insufficient documentation

## 2022-01-13 DIAGNOSIS — R1010 Upper abdominal pain, unspecified: Secondary | ICD-10-CM

## 2022-01-13 DIAGNOSIS — D72829 Elevated white blood cell count, unspecified: Secondary | ICD-10-CM | POA: Diagnosis not present

## 2022-01-13 DIAGNOSIS — R1011 Right upper quadrant pain: Secondary | ICD-10-CM | POA: Diagnosis present

## 2022-01-13 LAB — COMPREHENSIVE METABOLIC PANEL
ALT: 24 U/L (ref 0–44)
AST: 19 U/L (ref 15–41)
Albumin: 3 g/dL — ABNORMAL LOW (ref 3.5–5.0)
Alkaline Phosphatase: 341 U/L — ABNORMAL HIGH (ref 38–126)
Anion gap: 12 (ref 5–15)
BUN: <5 mg/dL — ABNORMAL LOW (ref 6–20)
CO2: 25 mmol/L (ref 22–32)
Calcium: 8.4 mg/dL — ABNORMAL LOW (ref 8.9–10.3)
Chloride: 94 mmol/L — ABNORMAL LOW (ref 98–111)
Creatinine, Ser: 0.4 mg/dL — ABNORMAL LOW (ref 0.61–1.24)
GFR, Estimated: >60 mL/min (ref >60)
Glucose, Bld: 335 mg/dL — ABNORMAL HIGH (ref 70–99)
Potassium: 3.9 mmol/L (ref 3.5–5.1)
Sodium: 131 mmol/L — ABNORMAL LOW (ref 135–145)
Total Bilirubin: 1.3 mg/dL — ABNORMAL HIGH (ref 0.3–1.2)
Total Protein: 7.7 g/dL (ref 6.5–8.1)

## 2022-01-13 LAB — CBC WITH DIFFERENTIAL/PLATELET
Abs Immature Granulocytes: 0.16 10*3/uL — ABNORMAL HIGH (ref 0.00–0.07)
Basophils Absolute: 0.1 10*3/uL (ref 0.0–0.1)
Basophils Relative: 0 %
Eosinophils Absolute: 0.1 10*3/uL (ref 0.0–0.5)
Eosinophils Relative: 1 %
HCT: 34.4 % — ABNORMAL LOW (ref 39.0–52.0)
Hemoglobin: 11.1 g/dL — ABNORMAL LOW (ref 13.0–17.0)
Immature Granulocytes: 1 %
Lymphocytes Relative: 11 %
Lymphs Abs: 1.8 10*3/uL (ref 0.7–4.0)
MCH: 27.8 pg (ref 26.0–34.0)
MCHC: 32.3 g/dL (ref 30.0–36.0)
MCV: 86 fL (ref 80.0–100.0)
Monocytes Absolute: 2.4 10*3/uL — ABNORMAL HIGH (ref 0.1–1.0)
Monocytes Relative: 15 %
Neutro Abs: 11.8 10*3/uL — ABNORMAL HIGH (ref 1.7–7.7)
Neutrophils Relative %: 72 %
Platelets: 473 10*3/uL — ABNORMAL HIGH (ref 150–400)
RBC: 4 MIL/uL — ABNORMAL LOW (ref 4.22–5.81)
RDW: 16 % — ABNORMAL HIGH (ref 11.5–15.5)
WBC: 16.3 10*3/uL — ABNORMAL HIGH (ref 4.0–10.5)
nRBC: 0 % (ref 0.0–0.2)

## 2022-01-13 LAB — LIPASE, BLOOD: Lipase: 18 U/L (ref 11–51)

## 2022-01-13 MED ORDER — HYDROMORPHONE HCL 1 MG/ML IJ SOLN
0.5000 mg | Freq: Once | INTRAMUSCULAR | Status: AC
  Administered 2022-01-13: 0.5 mg via INTRAVENOUS
  Filled 2022-01-13: qty 1

## 2022-01-13 MED ORDER — HEPARIN SOD (PORK) LOCK FLUSH 100 UNIT/ML IV SOLN
500.0000 [IU] | Freq: Once | INTRAVENOUS | Status: AC
  Administered 2022-01-13: 500 [IU] via INTRAVENOUS

## 2022-01-13 MED ORDER — HYDROMORPHONE HCL 1 MG/ML IJ SOLN
1.0000 mg | Freq: Once | INTRAMUSCULAR | Status: AC
  Administered 2022-01-13: 1 mg via INTRAVENOUS
  Filled 2022-01-13: qty 1

## 2022-01-13 MED ORDER — IOHEXOL 300 MG/ML  SOLN
100.0000 mL | Freq: Once | INTRAMUSCULAR | Status: AC | PRN
  Administered 2022-01-13: 100 mL via INTRAVENOUS

## 2022-01-13 MED ORDER — SODIUM CHLORIDE 0.9 % IV BOLUS
1000.0000 mL | Freq: Once | INTRAVENOUS | Status: AC
  Administered 2022-01-13: 1000 mL via INTRAVENOUS

## 2022-01-13 MED ORDER — ONDANSETRON HCL 4 MG/2ML IJ SOLN
4.0000 mg | Freq: Once | INTRAMUSCULAR | Status: AC
  Administered 2022-01-13: 4 mg via INTRAVENOUS
  Filled 2022-01-13: qty 2

## 2022-01-13 MED ORDER — HEPARIN SOD (PORK) LOCK FLUSH 100 UNIT/ML IV SOLN
INTRAVENOUS | Status: AC
  Filled 2022-01-13: qty 5

## 2022-01-13 MED ORDER — PIPERACILLIN-TAZOBACTAM 3.375 G IVPB 30 MIN
3.3750 g | Freq: Once | INTRAVENOUS | Status: AC
  Administered 2022-01-13: 3.375 g via INTRAVENOUS
  Filled 2022-01-13: qty 50

## 2022-01-13 MED ORDER — SODIUM CHLORIDE 0.9 % IV SOLN
INTRAVENOUS | Status: AC
  Filled 2022-01-13: qty 1000

## 2022-01-13 NOTE — ED Triage Notes (Signed)
Ems brings pt in from home for right sided abdominal pain that started Friday. Pt reports diarrhea. ?

## 2022-01-13 NOTE — ED Provider Notes (Signed)
?[facility] DEPT ?Provider Note ? ? ?CSN: [id number] ?Arrival date & time: 01/13/22  0076 ? ?  ? ?History ? ?Chief Complaint  ?Patient presents with  ? Abdominal Pain  ? ? ?[name] [name] is a 61 y.o. male. ? ?Patient complains of right upper quadrant pain.  Patient has a history of pancreatitis and has had abdominal infections.  Patient has a J-tube in place.  He is seen over at [facility]. ? ?The history is provided by the patient and medical records. No language interpreter was used.  ?Abdominal Pain ?Pain location:  Epigastric ?Pain quality: aching   ?Pain radiates to:  Does not radiate ?Pain severity:  Moderate ?Onset quality:  Gradual ?Timing:  Constant ?Progression:  Worsening ?Chronicity:  Recurrent ?Context: not alcohol use   ?Relieved by:  Nothing ?Associated symptoms: no chest pain, no cough, no diarrhea, no fatigue and no hematuria   ? ?  ? ?Home Medications ?Prior to Admission medications   ?Medication Sig Start Date End Date Taking? Authorizing Provider  ?Accu-Chek Softclix Lancets lancets Check blood glucose level by fingerstick 3-4 times per day. 07/04/21   Charlott Rakes, MD  ?acetaminophen (TYLENOL) 500 MG tablet Take 500 mg by mouth every 6 (six) hours as needed for moderate pain.    [provider]  ?albuterol (VENTOLIN HFA) 108 (90 Base) MCG/ACT inhaler Inhale 2 puffs into the lungs every 6 (six) hours as needed for wheezing or shortness of breath. ?Patient taking differently: Inhale 1 puff into the lungs every 6 (six) hours as needed for shortness of breath. 11/25/21   Orson Slick, MD  ?aspirin 81 MG EC tablet Take 1 tablet by mouth daily. 12/02/21   [name], NP  ?atorvastatin (LIPITOR) 20 MG tablet Take 1 tablet (20 mg total) by mouth daily. 10/02/21 03/01/22  [name], NP  ?Blood Glucose Monitoring Suppl (ACCU-CHEK GUIDE) w/Device KIT Check blood glucose level by fingerstick 3-4 times per day. 07/04/21   Charlott Rakes, MD   ?chlorhexidine (PERIDEX) 0.12 % solution Rinse between teeth using irrigation syringe (15 mL) 2-3 times a day or as needed for gum irritation. ?Patient taking differently: Use as directed 5 mLs in the mouth or throat daily as needed (for gum irriation). 09/12/21   Sandi Mariscal B, DMD  ?Continuous Blood Gluc Receiver (FREESTYLE LIBRE 2 READER) DEVI Use as instructed. Check blood glucose level by fingerstick 4-5 per day. E11.65 12/10/21   [name], NP  ?Continuous Blood Gluc Sensor (FREESTYLE LIBRE 14 DAY SENSOR) MISC Use as instructed, Check blood glucose level by fingerstick 4 - 5 times per day. 12/10/21   [name], NP  ?diphenoxylate-atropine (LOMOTIL) 2.5-0.025 MG tablet Take 1 tablet by mouth 4 (four) times daily as needed for diarrhea or loose stools. 10/08/21   [name], NP  ?DULoxetine (CYMBALTA) 60 MG capsule Take 1 capsule (60 mg total) by mouth daily. 09/10/21 03/01/22  [name], NP  ?EPINEPHrine 0.3 mg/0.3 mL IJ SOAJ injection Inject 0.3 mg into the muscle once as needed for anaphylaxis. 06/11/20   [provider]  ?fluconazole (DIFLUCAN) 200 MG tablet Take 2 tablets (400 mg total) by mouth daily. 01/08/22   Oswald Hillock, MD  ?fluconazole (DIFLUCAN) 200 MG tablet Take 2 tablets by mouth daily. 01/07/22   Oswald Hillock, MD  ?folic acid (FOLVITE) 1 MG tablet Take 1 tablet (1 mg total) by mouth in the morning. 03/28/21   Rai, Ripudeep  K, MD  ?glucose blood (ACCU-CHEK GUIDE) test strip Check blood glucose level by fingerstick 3 - 4 times per day. 07/04/21   Charlott Rakes, MD  ?Insulin NPH, Human,, Isophane, (HUMULIN N KWIKPEN) 100 UNIT/ML Kiwkpen Inject 35 Units into the skin every morning. ?Patient taking differently: Inject 35 Units into the skin daily. 09/03/21   Renato Shin, MD  ?Insulin Pen Needle (TRUEPLUS PEN NEEDLES) 32G X 4 MM MISC Use as instructed. Inject into the skin twice daily 03/04/21   [name], NP  ?Insulin Pen Needle 32G X 4 MM MISC Use as directed  with humulin kwikpen. 12/02/21     ?Insulin Syringe-Needle U-100 (TRUEPLUS INSULIN SYRINGE) 31G X 5/16" 0.3 ML MISC Use to inject insulin once daily. 07/31/21   Charlott Rakes, MD  ?lidocaine-prilocaine (EMLA) cream Apply 1 application topically as directed as needed. 08/07/21   Lincoln Brigham, PA-C  ?lipase/protease/amylase (CREON) 12000-38000 units CPEP capsule Take 09,381-82,993 Units by mouth See admin instructions. Take 2 capsules by mouth daily with meals and may take 1 capsule by mouth as needed with snakes.    [provider]  ?losartan (COZAAR) 50 MG tablet Take 1 tablet (50 mg total) by mouth daily. 05/19/21 01/05/22  [name], NP  ?metoCLOPramide (REGLAN) 5 MG tablet Take 1 tablet (5 mg total) by mouth 4 (four) times daily -  before meals and at bedtime. ?Patient not taking: Reported on 01/05/2022 05/08/21 07/04/21  Hosie Poisson, MD  ?mirtazapine (REMERON) 30 MG tablet Take 1 tablet (30 mg total) by mouth at bedtime. 07/04/21 02/05/22  Charlott Rakes, MD  ?Multiple Vitamins-Minerals (THEREMS-M) TABS Take 1 tablet by mouth in the morning. ?Patient taking differently: Take 1 tablet by mouth daily. 07/04/21   [name], NP  ?nicotine (NICODERM CQ - DOSED IN MG/24 HOURS) 14 mg/24hr patch Apply one patch daily x 12 weeks. 10/08/21     ?ondansetron (ZOFRAN) 8 MG tablet Take 1 tablet (8 mg total) by mouth 2 (two) times daily as needed. Start on day 3 after chemotherapy. ?Patient taking differently: Take 8 mg by mouth 2 (two) times daily as needed for nausea. 08/07/21   Lincoln Brigham, PA-C  ?oxyCODONE (OXY IR/ROXICODONE) 5 MG immediate release tablet Take 1-2 tablets (5-10 mg total) by mouth every 6 (six) hours as needed for severe pain. 12/30/21   Orson Slick, MD  ?oxyCODONE ER [facility] ER) 9 MG C12A Take 1 capsule by mouth every 12 hours. 12/22/21   Orson Slick, MD  ?pantoprazole (PROTONIX) 40 MG tablet Take 1 tablet (40 mg total) by mouth daily. 12/02/21 03/02/22  [name], NP   ?polyethylene glycol powder (GLYCOLAX/MIRALAX) 17 GM/SCOOP powder Take 17 g by mouth at bedtime. ?Patient taking differently: Take 17 g by mouth daily as needed for mild constipation. 05/08/21   Hosie Poisson, MD  ?prochlorperazine (COMPAZINE) 10 MG tablet Take 1 tablet (10 mg total) by mouth every 6 (six) hours as needed for nausea or vomiting. ?Patient taking differently: Take 10 mg by mouth every 6 (six) hours as needed for vomiting or nausea. 08/07/21   Lincoln Brigham, PA-C  ?Syringe, Disposable, 3 ML MISC Use As Directed 07/31/21   Renato Shin, MD  ?varenicline (APO-VARENICLINE) 1 MG tablet Take 1 tablet by mouth once daily in the morning with food ?Patient taking differently: Take 1 mg by mouth daily. 08/26/21     ?   ? ?Allergies    ?Bee venom and Lactose  intolerance (gi)   ? ?Review of Systems   ?Review of Systems  ?Constitutional:  Negative for appetite change and fatigue.  ?HENT:  Negative for congestion, ear discharge and sinus pressure.   ?Eyes:  Negative for discharge.  ?Respiratory:  Negative for cough.   ?Cardiovascular:  Negative for chest pain.  ?Gastrointestinal:  Positive for abdominal pain. Negative for diarrhea.  ?Genitourinary:  Negative for frequency and hematuria.  ?Musculoskeletal:  Negative for back pain.  ?Skin:  Negative for rash.  ?Neurological:  Negative for seizures and headaches.  ?Psychiatric/Behavioral:  Negative for hallucinations.   ? ?Physical Exam ?Updated Vital Signs ?BP 122/73   Pulse 95   Temp 98.9 ?F (37.2 ?C) (Oral)   Resp 14   Ht '5\' 11"'  (1.803 m)   Wt 58.1 kg   SpO2 96%   BMI 17.85 kg/m?  ?Physical Exam ?Vitals and nursing note reviewed.  ?Constitutional:   ?   Appearance: He is well-developed.  ?HENT:  ?   Head: Normocephalic.  ?   Nose: Nose normal.  ?Eyes:  ?   General: No scleral icterus. ?   Conjunctiva/sclera: Conjunctivae normal.  ?Neck:  ?   Thyroid: No thyromegaly.  ?Cardiovascular:  ?   Rate and Rhythm: Normal rate and regular rhythm.  ?   Heart sounds: No  murmur heard. ?  No friction rub. No gallop.  ?Pulmonary:  ?   Breath sounds: No stridor. No wheezing or rales.  ?Chest:  ?   Chest wall: No tenderness.  ?Abdominal:  ?   General: There is no distension.  ?   Tenderness: There i

## 2022-01-13 NOTE — Discharge Instructions (Addendum)
Patient will be transferred to [facility] emergency department and Dr. Wray Kearns will be seen the patient ?

## 2022-01-14 ENCOUNTER — Ambulatory Visit: Payer: Medicaid Other | Admitting: Endocrinology

## 2022-01-14 NOTE — Progress Notes (Incomplete)
Patient ID: [name] [name], male   DOB: [birth year], 61 y.o.   MRN: [id number] ? ?       ? ? ?Reason for Appointment: Type II Diabetes follow-up  ? ?History of Present Illness  ? ?Diagnosis date:  ? ?Previous history: ? ?Recent history: ?    ?Non-insulin hypoglycemic drugs:  ?    ?Insulin regimen:         ?   ?Side effects from medications: None ? ?Current self management, blood sugar patterns and problems identified: ? ? ? ?Exercise: ?Diet management: ?     ?Monitors blood glucose: Once a day.    Glucometer: One Touch.          ? ?Blood Glucose readings from meter download:  ? ?PRE-MEAL Fasting Lunch Dinner Bedtime Overall  ?Glucose range:       ?Mean/median:       ? ?POST-MEAL PC Breakfast PC Lunch PC Dinner  ?Glucose range:     ?Mean/median:     ? ? ? Hypoglycemia:  none ?             ?          ?Dietician visit: Most recent:     ? ?Weight control: ? ?Wt Readings from Last 3 Encounters:  ?01/13/22 128 lb (58.1 kg)  ?01/05/22 126 lb 15.8 oz (57.6 kg)  ?12/25/21 130 lb (59 kg)  ?      ?  ?Complications:    ? ?Diabetes labs: ? ?Lab Results  ?Component Value Date  ? HGBA1C 9.7 (A) 09/03/2021  ? HGBA1C 10.3 (A) 07/09/2021  ? HGBA1C 9.6 (H) 04/28/2021  ? ?Lab Results  ?Component Value Date  ? MICROALBUR 0.8 04/23/2016  ? [place] 89 04/28/2021  ? CREATININE 0.40 (L) 01/13/2022  ? ? ? ?Allergies as of 01/14/2022   ? ?   Reactions  ? Bee Venom Anaphylaxis  ? Lactose Intolerance (gi) Other (See Comments)  ? GI upset  ? ?  ? ?  ?Medication List  ?  ? ?  ? Accurate as of Jan 14, 2022 12:11 PM. If you have any questions, ask your nurse or doctor.  ?  ?  ? ?  ? ?Accu-Chek Guide test strip ?Generic drug: glucose blood ?Check blood glucose level by fingerstick 3 - 4 times per day. ?  ?Accu-Chek Guide w/Device Kit ?Check blood glucose level by fingerstick 3-4 times per day. ?  ?Accu-Chek Softclix Lancets lancets ?Check blood glucose level by fingerstick 3-4 times per day. ?  ?acetaminophen 500 MG tablet ?Commonly known as:  TYLENOL ?Take 500 mg by mouth every 6 (six) hours as needed for moderate pain. ?  ?aspirin 81 MG EC tablet ?Take 1 tablet by mouth daily. ?  ?atorvastatin 20 MG tablet ?Commonly known as: LIPITOR ?Take 1 tablet (20 mg total) by mouth daily. ?  ?chlorhexidine 0.12 % solution ?Commonly known as: PERIDEX ?Rinse between teeth using irrigation syringe (15 mL) 2-3 times a day or as needed for gum irritation. ?What changed:  ?how much to take ?how to take this ?when to take this ?reasons to take this ?additional instructions ?  ?diphenoxylate-atropine 2.5-0.025 MG tablet ?Commonly known as: Lomotil ?Take 1 tablet by mouth 4 (four) times daily as needed for diarrhea or loose stools. ?  ?DULoxetine 60 MG capsule ?Commonly known as: CYMBALTA ?Take 1 capsule (60 mg total) by mouth daily. ?  ?EPINEPHrine 0.3 mg/0.3 mL Soaj injection ?Commonly known as: EPI-PEN ?Inject 0.3 mg into the  muscle once as needed for anaphylaxis. ?  ?fluconazole 200 MG tablet ?Commonly known as: DIFLUCAN ?Take 2 tablets by mouth daily. ?  ?fluconazole 200 MG tablet ?Commonly known as: DIFLUCAN ?Take 2 tablets (400 mg total) by mouth daily. ?  ?folic acid 1 MG tablet ?Commonly known as: FOLVITE ?Take 1 tablet (1 mg total) by mouth in the morning. ?  ?FreeStyle Libre 14 Day Sensor Misc ?Use as instructed, Check blood glucose level by fingerstick 4 - 5 times per day. ?  ?FreeStyle [place] 2 Reader Kerrin Mo ?Use as instructed. Check blood glucose level by fingerstick 4-5 per day. E11.65 ?  ?HumuLIN N KwikPen 100 UNIT/ML Kiwkpen ?Generic drug: Insulin NPH (Human) (Isophane) ?Inject 35 Units into the skin every morning. ?What changed: when to take this ?  ?lidocaine-prilocaine cream ?Commonly known as: EMLA ?Apply 1 application topically as directed as needed. ?  ?lipase/protease/amylase 12000-38000 units Cpep capsule ?Commonly known as: CREON ?Take 36,000-72,000 Units by mouth See admin instructions. Take 2 capsules by mouth daily with meals and may take 1  capsule by mouth as needed with snakes. ?  ?losartan 50 MG tablet ?Commonly known as: COZAAR ?Take 1 tablet (50 mg total) by mouth daily. ?  ?metoCLOPramide 5 MG tablet ?Commonly known as: Reglan ?Take 1 tablet (5 mg total) by mouth 4 (four) times daily -  before meals and at bedtime. ?  ?mirtazapine 30 MG tablet ?Commonly known as: REMERON ?Take 1 tablet (30 mg total) by mouth at bedtime. ?  ?nicotine 14 mg/24hr patch ?Commonly known as: NICODERM CQ - dosed in mg/24 hours ?Apply one patch daily x 12 weeks. ?  ?ondansetron 8 MG tablet ?Commonly known as: Zofran ?Take 1 tablet (8 mg total) by mouth 2 (two) times daily as needed. Start on day 3 after chemotherapy. ?What changed:  ?reasons to take this ?additional instructions ?  ?oxyCODONE 5 MG immediate release tablet ?Commonly known as: Oxy IR/ROXICODONE ?Take 1-2 tablets (5-10 mg total) by mouth every 6 (six) hours as needed for severe pain. ?  ?pantoprazole 40 MG tablet ?Commonly known as: PROTONIX ?Take 1 tablet (40 mg total) by mouth daily. ?  ?polyethylene glycol powder 17 GM/SCOOP powder ?Commonly known as: GLYCOLAX/MIRALAX ?Take 17 g by mouth at bedtime. ?What changed:  ?when to take this ?reasons to take this ?  ?prochlorperazine 10 MG tablet ?Commonly known as: COMPAZINE ?Take 1 tablet (10 mg total) by mouth every 6 (six) hours as needed for nausea or vomiting. ?What changed: reasons to take this ?  ?Syringe (Disposable) 3 ML Misc ?Use As Directed ?  ?Therems-M Tabs ?Take 1 tablet by mouth in the morning. ?What changed: when to take this ?  ?TRUEplus Insulin Syringe 31G X 5/16" 0.3 ML Misc ?Generic drug: Insulin Syringe-Needle U-100 ?Use to inject insulin once daily. ?  ?TRUEplus Pen Needles 32G X 4 MM Misc ?Generic drug: Insulin Pen Needle ?Use as instructed. Inject into the skin twice daily ?  ?Unifine Pentips 32G X 4 MM Misc ?Generic drug: Insulin Pen Needle ?Use as directed with humulin kwikpen. ?  ?varenicline 1 MG tablet ?Commonly known as:  APO-Varenicline ?Take 1 tablet by mouth once daily in the morning with food ?What changed:  ?how much to take ?when to take this ?  ?Ventolin HFA 108 (90 Base) MCG/ACT inhaler ?Generic drug: albuterol ?Inhale 2 puffs into the lungs every 6 (six) hours as needed for wheezing or shortness of breath. ?What changed:  ?how much to take ?how to take  this ?when to take this ?reasons to take this ?  ?Xtampza ER 9 MG C12a ?Generic drug: oxyCODONE ER ?Take 1 capsule by mouth every 12 hours. ?  ? ?  ? ? ?Allergies:  ?Allergies  ?Allergen Reactions  ? Bee Venom Anaphylaxis  ? Lactose Intolerance (Gi) Other (See Comments)  ?  GI upset  ? ? ?Past Medical History:  ?Diagnosis Date  ? Barrett's esophagus dx 2016  ? Bronchitis   ? Chronic hepatitis C without hepatic coma ([place]) 10/31/2014  ? Depression   ? Diabetes ([place])   ? ED (erectile dysfunction)   ? GERD (gastroesophageal reflux disease)   ? Hepatitis C   ? Hiatal hernia 10/2014  ? 3cm  ? Neuropathy   ? pancreatic ca 12/2020  ? 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 ? ?Past Surgical History:  ?Procedure Laterality Date  ? AMPUTATION Left 11/15/2020  ? Procedure: LEFT TRANSMETATARSALS AMPUTATION;  Surgeon: Newt Minion, MD;  Location: [facility];  Service: Orthopedics;  Laterality: Left;  ? APPENDECTOMY    ? BILIARY STENT PLACEMENT N/A 02/27/2021  ? Procedure: BILIARY STENT PLACEMENT;  Surgeon: Milus Banister, MD;  Location: [facility];  Service: Endoscopy;  Laterality: N/A;  ? BILIARY STENT PLACEMENT N/A 03/27/2021  ? Procedure: BILIARY STENT PLACEMENT;  Surgeon: Jackquline Denmark, MD;  Location: [facility];  Service: Endoscopy;  Laterality: N/A;  ? BIOPSY  03/27/2021  ? Procedure: BIOPSY;  Surgeon: Jackquline Denmark, MD;  Location: [facility];  Service: Endoscopy;;  ? COLONOSCOPY N/A 02/16/2014  ? Procedure: COLONOSCOPY;  Surgeon: Gatha Mayer, MD;  Location: [facility];  Service: Endoscopy;  Laterality: N/A;  ? COLONOSCOPY    ? ENDOSCOPIC RETROGRADE  CHOLANGIOPANCREATOGRAPHY (ERCP) WITH PROPOFOL N/A 02/27/2021  ? Procedure: ENDOSCOPIC RETROGRADE CHOLANGIOPANCREATOGRAPHY (ERCP) WITH PROPOFOL;  Surgeon: Milus Banister, MD;  Location: [facility];  Service: Endoscopy;  Ladean Raya

## 2022-01-16 ENCOUNTER — Encounter: Payer: Self-pay | Admitting: Hematology and Oncology

## 2022-01-16 ENCOUNTER — Other Ambulatory Visit: Payer: Self-pay | Admitting: Hematology and Oncology

## 2022-01-16 ENCOUNTER — Other Ambulatory Visit (HOSPITAL_COMMUNITY): Payer: Self-pay

## 2022-01-16 MED ORDER — OXYCODONE HCL 5 MG PO TABS
5.0000 mg | ORAL_TABLET | Freq: Four times a day (QID) | ORAL | 0 refills | Status: DC | PRN
  Filled 2022-01-16: qty 120, 15d supply, fill #0

## 2022-01-19 ENCOUNTER — Other Ambulatory Visit: Payer: Self-pay

## 2022-01-19 ENCOUNTER — Encounter (HOSPITAL_BASED_OUTPATIENT_CLINIC_OR_DEPARTMENT_OTHER): Payer: Medicaid Other | Admitting: General Surgery

## 2022-01-19 ENCOUNTER — Other Ambulatory Visit (HOSPITAL_COMMUNITY): Payer: Self-pay

## 2022-01-19 DIAGNOSIS — E11621 Type 2 diabetes mellitus with foot ulcer: Secondary | ICD-10-CM | POA: Diagnosis not present

## 2022-01-19 NOTE — Telephone Encounter (Signed)
Can someone help with this?
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
## 2022-01-20 ENCOUNTER — Inpatient Hospital Stay: Payer: Medicaid Other

## 2022-01-20 ENCOUNTER — Other Ambulatory Visit: Payer: Self-pay | Admitting: Hematology and Oncology

## 2022-01-20 ENCOUNTER — Other Ambulatory Visit (HOSPITAL_COMMUNITY): Payer: Self-pay

## 2022-01-20 ENCOUNTER — Other Ambulatory Visit: Payer: Self-pay

## 2022-01-20 ENCOUNTER — Other Ambulatory Visit: Payer: Self-pay | Admitting: Nurse Practitioner

## 2022-01-20 ENCOUNTER — Inpatient Hospital Stay: Payer: Medicaid Other | Attending: Hematology and Oncology | Admitting: Hematology and Oncology

## 2022-01-20 VITALS — BP 114/83 | HR 116 | Temp 97.0°F | Resp 17 | Ht 71.0 in | Wt 129.2 lb

## 2022-01-20 DIAGNOSIS — Z452 Encounter for adjustment and management of vascular access device: Secondary | ICD-10-CM | POA: Insufficient documentation

## 2022-01-20 DIAGNOSIS — C250 Malignant neoplasm of head of pancreas: Secondary | ICD-10-CM

## 2022-01-20 DIAGNOSIS — F1010 Alcohol abuse, uncomplicated: Secondary | ICD-10-CM

## 2022-01-20 DIAGNOSIS — Z95828 Presence of other vascular implants and grafts: Secondary | ICD-10-CM

## 2022-01-20 DIAGNOSIS — E1159 Type 2 diabetes mellitus with other circulatory complications: Secondary | ICD-10-CM

## 2022-01-20 DIAGNOSIS — Z794 Long term (current) use of insulin: Secondary | ICD-10-CM

## 2022-01-20 LAB — CMP (CANCER CENTER ONLY)
ALT: 13 U/L (ref 0–44)
AST: 17 U/L (ref 15–41)
Albumin: 3.1 g/dL — ABNORMAL LOW (ref 3.5–5.0)
Alkaline Phosphatase: 223 U/L — ABNORMAL HIGH (ref 38–126)
Anion gap: 4 — ABNORMAL LOW (ref 5–15)
BUN: 12 mg/dL (ref 6–20)
CO2: 28 mmol/L (ref 22–32)
Calcium: 8.6 mg/dL — ABNORMAL LOW (ref 8.9–10.3)
Chloride: 96 mmol/L — ABNORMAL LOW (ref 98–111)
Creatinine: 0.46 mg/dL — ABNORMAL LOW (ref 0.61–1.24)
GFR, Estimated: >60 mL/min (ref >60)
Glucose, Bld: 269 mg/dL — ABNORMAL HIGH (ref 70–99)
Potassium: 3.8 mmol/L (ref 3.5–5.1)
Sodium: 128 mmol/L — ABNORMAL LOW (ref 135–145)
Total Bilirubin: 0.6 mg/dL (ref 0.3–1.2)
Total Protein: 7.5 g/dL (ref 6.5–8.1)

## 2022-01-20 LAB — CBC WITH DIFFERENTIAL (CANCER CENTER ONLY)
Abs Immature Granulocytes: 0.16 10*3/uL — ABNORMAL HIGH (ref 0.00–0.07)
Basophils Absolute: 0.1 10*3/uL (ref 0.0–0.1)
Basophils Relative: 0 %
Eosinophils Absolute: 0 10*3/uL (ref 0.0–0.5)
Eosinophils Relative: 0 %
HCT: 31.7 % — ABNORMAL LOW (ref 39.0–52.0)
Hemoglobin: 10.4 g/dL — ABNORMAL LOW (ref 13.0–17.0)
Immature Granulocytes: 1 %
Lymphocytes Relative: 8 %
Lymphs Abs: 2 10*3/uL (ref 0.7–4.0)
MCH: 27.7 pg (ref 26.0–34.0)
MCHC: 32.8 g/dL (ref 30.0–36.0)
MCV: 84.5 fL (ref 80.0–100.0)
Monocytes Absolute: 2.4 10*3/uL — ABNORMAL HIGH (ref 0.1–1.0)
Monocytes Relative: 10 %
Neutro Abs: 20.3 10*3/uL — ABNORMAL HIGH (ref 1.7–7.7)
Neutrophils Relative %: 81 %
Platelet Count: 408 10*3/uL — ABNORMAL HIGH (ref 150–400)
RBC: 3.75 MIL/uL — ABNORMAL LOW (ref 4.22–5.81)
RDW: 16.7 % — ABNORMAL HIGH (ref 11.5–15.5)
WBC Count: 25 10*3/uL — ABNORMAL HIGH (ref 4.0–10.5)
nRBC: 0 % (ref 0.0–0.2)

## 2022-01-20 MED ORDER — FREESTYLE LIBRE 2 SENSOR MISC
6 refills | Status: DC
  Filled 2022-01-20 (×2): qty 1, 14d supply, fill #0
  Filled 2022-01-30: qty 1, 14d supply, fill #1
  Filled 2022-02-07: qty 1, 14d supply, fill #2
  Filled 2022-02-16: qty 1, 14d supply, fill #3
  Filled 2022-02-23: qty 1, 14d supply, fill #4
  Filled 2022-03-13: qty 1, 14d supply, fill #5
  Filled 2022-03-23: qty 1, 14d supply, fill #6

## 2022-01-20 MED ORDER — SODIUM CHLORIDE 0.9% FLUSH
10.0000 mL | Freq: Once | INTRAVENOUS | Status: AC
  Administered 2022-01-20: 10 mL

## 2022-01-20 MED ORDER — HEPARIN SOD (PORK) LOCK FLUSH 100 UNIT/ML IV SOLN
500.0000 [IU] | Freq: Once | INTRAVENOUS | Status: AC
  Administered 2022-01-20: 500 [IU]

## 2022-01-20 NOTE — Progress Notes (Unsigned)
Patient ID: [name] [name], male   DOB: 29-Apr-1961, 61 y.o.   MRN: [id number]  HPI: [name] [name] is a 61 y.o.-year-old male, returning for follow-up for DM2, dx in 2014, insulin-dependent since 2021, uncontrolled, with complications (peripheral neuropathy, left transmetatarsal amputation, gastroparesis, ED). Pt. previously saw Dr. Loanne Drilling, last visit 3 months ago.  Patient has a pertinent history of pancreatic cancer (head of the pancreas, dx. 2022).  He is status post biliary stents.  He is preparing for Whipple procedure.  Reviewed HbA1c: Lab Results  Component Value Date   HGBA1C 9.7 (A) 09/03/2021   HGBA1C 10.3 (A) 07/09/2021   HGBA1C 9.6 (H) 04/28/2021   HGBA1C 10.3 (H) 01/28/2021   HGBA1C 11.2 (H) 10/28/2020   HGBA1C 12.1 (H) 09/23/2020   HGBA1C 6.1 (H) 05/13/2020   HGBA1C 8.0 (H) 12/19/2019   HGBA1C 8.4 (H) 09/20/2019   HGBA1C 8.5 (A) 06/16/2019   Pt is on a regimen of: - NPH 35 units in a.m.  Pt checks his sugars *** a day and they are: - am: n/c - 2h after b'fast: n/c - before lunch: n/c - 2h after lunch: n/c - before dinner: n/c - 2h after dinner: n/c - bedtime: n/c - nighttime: n/c Lowest sugar was ***; he has hypoglycemia awareness at 70.  Highest sugar was ***.  Glucometer: Accu-Chek guide  Pt's meals are: - Breakfast: - Lunch: - Dinner: - Snacks:  - no CKD, last BUN/creatinine:  Lab Results  Component Value Date   BUN 12 01/20/2022   BUN <5 (L) 01/13/2022   CREATININE 0.46 (L) 01/20/2022   CREATININE 0.40 (L) 01/13/2022  On Cozaar 50 mg daily.  -+ HL; last set of lipids: Lab Results  Component Value Date   CHOL 174 04/28/2021   HDL 70 04/28/2021   LDLCALC 89 04/28/2021   TRIG 78 04/28/2021   CHOLHDL 2.5 04/28/2021  He is on Lipitor 20 mg daily  - last eye exam was in ***. No DR.   - no numbness and tingling in his feet.  He also has a history of GERD, Barrett's esophagus, hepatitis C, depression  ROS: + see HPI No increased  urination, blurry vision, nausea, chest pain.  Past Medical History:  Diagnosis Date   Barrett's esophagus dx 2016   Bronchitis    Chronic hepatitis C without hepatic coma ([place]) 10/31/2014   Depression    Diabetes [facility])    ED (erectile dysfunction)    GERD (gastroesophageal reflux disease)    Hepatitis C    Hiatal hernia 10/2014   3cm   Neuropathy    pancreatic ca 12/2020   S/P transmetatarsal amputation of foot, left ([place]) 11/28/2020   Past Surgical History:  Procedure Laterality Date   AMPUTATION Left 11/15/2020   Procedure: LEFT TRANSMETATARSALS AMPUTATION;  Surgeon: Newt Minion, MD;  Location: [place];  Service: Orthopedics;  Laterality: Left;   APPENDECTOMY     BILIARY STENT PLACEMENT N/A 02/27/2021   Procedure: BILIARY STENT PLACEMENT;  Surgeon: Milus Banister, MD;  Location: [facility];  Service: Endoscopy;  Laterality: N/A;   BILIARY STENT PLACEMENT N/A 03/27/2021   Procedure: BILIARY STENT PLACEMENT;  Surgeon: Jackquline Denmark, MD;  Location: [facility];  Service: Endoscopy;  Laterality: N/A;   BIOPSY  03/27/2021   Procedure: BIOPSY;  Surgeon: Jackquline Denmark, MD;  Location: [facility];  Service: Endoscopy;;   COLONOSCOPY N/A 02/16/2014   Procedure: COLONOSCOPY;  Surgeon: Gatha Mayer, MD;  Location: [facility];  Service: Endoscopy;  Laterality: N/A;   COLONOSCOPY     ENDOSCOPIC RETROGRADE CHOLANGIOPANCREATOGRAPHY (ERCP) WITH PROPOFOL N/A 02/27/2021   Procedure: ENDOSCOPIC RETROGRADE CHOLANGIOPANCREATOGRAPHY (ERCP) WITH PROPOFOL;  Surgeon: Milus Banister, MD;  Location: [facility];  Service: Endoscopy;  Laterality: N/A;   ERCP N/A 03/27/2021   Procedure: ENDOSCOPIC RETROGRADE CHOLANGIOPANCREATOGRAPHY (ERCP);  Surgeon: Jackquline Denmark, MD;  Location: [facility];  Service: Endoscopy;  Laterality: N/A;   ESOPHAGOGASTRODUODENOSCOPY (EGD) WITH PROPOFOL N/A 05/06/2021   Procedure: ESOPHAGOGASTRODUODENOSCOPY (EGD) WITH PROPOFOL;  Surgeon: Mauri Pole, MD;   Location: [facility];  Service: Endoscopy;  Laterality: N/A;   EUS N/A 02/27/2021   Procedure: UPPER ENDOSCOPIC ULTRASOUND (EUS) RADIAL;  Surgeon: Milus Banister, MD;  Location: [facility];  Service: Endoscopy;  Laterality: N/A;   FINE NEEDLE ASPIRATION N/A 02/27/2021   Procedure: FINE NEEDLE ASPIRATION (FNA) LINEAR;  Surgeon: Milus Banister, MD;  Location: [facility];  Service: Endoscopy;  Laterality: N/A;   IR IMAGING GUIDED PORT INSERTION  03/21/2021   SPHINCTEROTOMY  02/27/2021   Procedure: SPHINCTEROTOMY;  Surgeon: Milus Banister, MD;  Location: [facility];  Service: Endoscopy;;   STENT REMOVAL  03/27/2021   Procedure: Lavell Islam REMOVAL;  Surgeon: Jackquline Denmark, MD;  Location: [facility];  Service: Endoscopy;;   TONSILLECTOMY     Social History   Socioeconomic History   Marital status: Single    Spouse name: Not on file   Number of children: Not on file   Years of education: Not on file   Highest education level: Not on file  Occupational History   Not on file  Tobacco Use   Smoking status: Every Day    Packs/day: 0.50    Years: 35.00    Pack years: 17.50    Types: Cigarettes   Smokeless tobacco: Never   Tobacco comments:    Currently being followed by the smoking cessation program   Vaping Use   Vaping Use: Never used  Substance and Sexual Activity   Alcohol use: Not Currently    Comment: previous   Drug use: No   Sexual activity: Not on file  Other Topics Concern   Not on file  Social History Narrative   Not on file   Social Determinants of Health   Financial Resource Strain: High Risk   Difficulty of Paying Living Expenses: Very hard  Food Insecurity: Food Insecurity Present   Worried About Running Out of Food in the Last Year: Sometimes true   Arboriculturist in the Last Year: Sometimes true  Transportation Needs: Public librarian (Medical): Yes   Lack of Transportation (Non-Medical): Yes  Physical Activity: Not  on file  Stress: Not on file  Social Connections: Not on file  Intimate Partner Violence: Not At Risk   Fear of Current or Ex-Partner: No   Emotionally Abused: No   Physically Abused: No   Sexually Abused: No   Current Outpatient Medications on File Prior to Visit  Medication Sig Dispense Refill   Accu-Chek Softclix Lancets lancets Check blood glucose level by fingerstick 3-4 times per day. 100 each 3   acetaminophen (TYLENOL) 500 MG tablet Take 500 mg by mouth every 6 (six) hours as needed for moderate pain.     albuterol (VENTOLIN HFA) 108 (90 Base) MCG/ACT inhaler Inhale 2 puffs into the lungs every 6 (six) hours as needed for wheezing or shortness of breath. (Patient taking differently: Inhale 1 puff into the lungs every 6 (six) hours as  needed for shortness of breath.) 18 g 0   aspirin 81 MG EC tablet Take 1 tablet by mouth daily. 100 tablet 0   atorvastatin (LIPITOR) 20 MG tablet Take 1 tablet (20 mg total) by mouth daily. 90 tablet 0   Blood Glucose Monitoring Suppl (ACCU-CHEK GUIDE) w/Device KIT Check blood glucose level by fingerstick 3-4 times per day. 1 kit 0   chlorhexidine (PERIDEX) 0.12 % solution Rinse between teeth using irrigation syringe (15 mL) 2-3 times a day or as needed for gum irritation. (Patient taking differently: Use as directed 5 mLs in the mouth or throat daily as needed (for gum irriation).) 473 mL 2   Continuous Blood Gluc Receiver (FREESTYLE LIBRE 2 READER) DEVI Use as instructed. Check blood glucose level by fingerstick 4 - 5 per day. 1 each 1   Continuous Blood Gluc Sensor (FREESTYLE LIBRE 14 DAY SENSOR) MISC Use as instructed. Check blood glucose level by fingerstick 4-5 per day. 1 each 6   Continuous Blood Gluc Sensor (FREESTYLE LIBRE 2 SENSOR) MISC Use as directed. 1 each 6   diphenoxylate-atropine (LOMOTIL) 2.5-0.025 MG tablet Take 1 tablet by mouth 4 (four) times daily as needed for diarrhea or loose stools. 60 tablet 1   DULoxetine (CYMBALTA) 60 MG  capsule Take 1 capsule (60 mg total) by mouth daily. 90 capsule 0   EPINEPHrine 0.3 mg/0.3 mL IJ SOAJ injection Inject 0.3 mg into the muscle once as needed for anaphylaxis.     fluconazole (DIFLUCAN) 200 MG tablet Take 2 tablets (400 mg total) by mouth daily. 3 tablet 0   fluconazole (DIFLUCAN) 200 MG tablet Take 2 tablets by mouth daily. 4 tablet 0   folic acid (FOLVITE) 1 MG tablet Take 1 tablet (1 mg total) by mouth in the morning.     glucose blood (ACCU-CHEK GUIDE) test strip Check blood glucose level by fingerstick 3 - 4 times per day. 100 each 12   Insulin NPH, Human,, Isophane, (HUMULIN N KWIKPEN) 100 UNIT/ML Kiwkpen Inject 35 Units into the skin every morning. (Patient taking differently: Inject 35 Units into the skin daily.) 45 mL 3   Insulin Pen Needle (TRUEPLUS PEN NEEDLES) 32G X 4 MM MISC Use as instructed. Inject into the skin twice daily 200 each 6   Insulin Pen Needle 32G X 4 MM MISC Use as directed with humulin kwikpen. 100 each 3   Insulin Syringe-Needle U-100 (TRUEPLUS INSULIN SYRINGE) 31G X 5/16" 0.3 ML MISC Use to inject insulin once daily. 100 each 6   lidocaine-prilocaine (EMLA) cream Apply 1 application topically as directed as needed. 30 g 0   lipase/protease/amylase (CREON) 12000-38000 units CPEP capsule Take 10,071-21,975 Units by mouth See admin instructions. Take 2 capsules by mouth daily with meals and may take 1 capsule by mouth as needed with snakes.     losartan (COZAAR) 50 MG tablet Take 1 tablet (50 mg total) by mouth daily. 90 tablet 1   metoCLOPramide (REGLAN) 5 MG tablet Take 1 tablet (5 mg total) by mouth 4 (four) times daily -  before meals and at bedtime. (Patient not taking: Reported on 01/05/2022) 90 tablet 1   mirtazapine (REMERON) 30 MG tablet Take 1 tablet (30 mg total) by mouth at bedtime. 90 tablet 1   Multiple Vitamins-Minerals (THEREMS-M) TABS Take 1 tablet by mouth in the morning. (Patient taking differently: Take 1 tablet by mouth daily.) 90 tablet 3    nicotine (NICODERM CQ - DOSED IN MG/24 HOURS) 14 mg/24hr patch Apply  one patch daily x 12 weeks. 84 patch 1   ondansetron (ZOFRAN) 8 MG tablet Take 1 tablet (8 mg total) by mouth 2 (two) times daily as needed. Start on day 3 after chemotherapy. (Patient taking differently: Take 8 mg by mouth 2 (two) times daily as needed for nausea.) 30 tablet 1   oxyCODONE (OXY IR/ROXICODONE) 5 MG immediate release tablet Take 1 to 2 tablets by mouth every 6 hours as needed for severe pain. 120 tablet 0   oxyCODONE ER (XTAMPZA ER) 9 MG C12A Take 1 capsule by mouth every 12 hours. 60 capsule 0   pantoprazole (PROTONIX) 40 MG tablet Take 1 tablet (40 mg total) by mouth daily. 90 tablet 0   polyethylene glycol powder (GLYCOLAX/MIRALAX) 17 GM/SCOOP powder Take 17 g by mouth at bedtime. (Patient taking differently: Take 17 g by mouth daily as needed for mild constipation.) 238 g 0   prochlorperazine (COMPAZINE) 10 MG tablet Take 1 tablet (10 mg total) by mouth every 6 (six) hours as needed for nausea or vomiting. (Patient taking differently: Take 10 mg by mouth every 6 (six) hours as needed for vomiting or nausea.) 30 tablet 1   Syringe, Disposable, 3 ML MISC Use As Directed 100 each 4   varenicline (APO-VARENICLINE) 1 MG tablet Take 1 tablet by mouth once daily in the morning with food (Patient taking differently: Take 1 mg by mouth daily.) 84 tablet 1   No current facility-administered medications on file prior to visit.   Allergies  Allergen Reactions   Bee Venom Anaphylaxis   Lactose Intolerance (Gi) Other (See Comments)    GI upset   Family History  Problem Relation Age of Onset   Breast cancer Mother    Stroke Father    Alcohol abuse Father    Heart disease Maternal Grandfather    Pancreatic cancer Paternal Grandmother    Diabetes Paternal Grandfather    Colon cancer Neg Hx    Stomach cancer Neg Hx    Rectal cancer Neg Hx    Esophageal cancer Neg Hx    PE: There were no vitals taken for this  visit. Wt Readings from Last 3 Encounters:  01/20/22 129 lb 3.2 oz (58.6 kg)  01/13/22 128 lb (58.1 kg)  01/05/22 126 lb 15.8 oz (57.6 kg)   Constitutional: thin, in NAD Eyes: PERRLA, EOMI, no exophthalmos ENT: moist mucous membranes, no thyromegaly, no cervical lymphadenopathy Cardiovascular: RRR, No MRG Respiratory: CTA B Musculoskeletal: no deformities, strength intact in all 4 Skin: moist, warm, no rashes Neurological: no tremor with outstretched hands, DTR normal in all 4  ASSESSMENT: 1. DM2, insulin-dependent, uncontrolled, with complications -Gastroparesis -Peripheral neuropathy -Left transmetatarsal amputation -ED  2. HL  PLAN:  1. Patient with long-standing, uncontrolled diabetes, on oral antidiabetic regimen, with still poor control.  Latest HbA1c was above target, at 9.7%.  He has an unfortunate history of pancreatic cancer and is preparing for surgery.  We discussed that in preparation for surgery he would ideally be on basal/bolus insulin regimen.  He will benefit from a peakless insulin.  At this visit I suggested to switch from intermediate acting insulin (NPH) to Lantus.  We will also add NovoLog or Humalog, which ever is covered by his insurance, before meals.  We discussed about how to vary the dose of insulin based on the size and consistency of the meal.  I advised him to inject the insulin 15 minutes before each meal. - I suggested to:  There are no  Patient Instructions on file for this visit. - check sugars at different times of the day - check 4x a day, rotating checks - discussed about CBG targets for treatment: 80-130 mg/dL before meals and <180 mg/dL after meals; target HbA1c <7%. - given sugar log and advised how to fill it and to bring it at next appt  - given foot care handout  - given instructions for hypoglycemia management "15-15 rule"  - advised for yearly eye exams  - Return to clinic in 3 mo with sugar log   2. HL - Reviewed latest lipid panel  from 03/2021: LDL above our goal of less than 70, the rest of the fractions at goal Lab Results  Component Value Date   CHOL 174 04/28/2021   HDL 70 04/28/2021   LDLCALC 89 04/28/2021   TRIG 78 04/28/2021   CHOLHDL 2.5 04/28/2021  - Continues Lipitor 10 mg daily without side effects.  Philemon Kingdom, MD PhD [facility] Endocrinology

## 2022-01-20 NOTE — Telephone Encounter (Signed)
It has been sent. Thank you.

## 2022-01-20 NOTE — Progress Notes (Signed)
[place] [phone]   Fax:([phone]) [phone]  PROGRESS NOTE  Patient Care Team: [name], NP as PCP - General (Nurse Practitioner) Havery Moros, Carlota Raspberry, MD as Consulting Physician (Gastroenterology) Orson Slick, MD as Consulting Physician (Oncology) Royston Bake, RN as Registered Nurse  Hematological/Oncological History # Adenocarcinoma of the Pancreas, Stage IIB 02/26/2021: patient seen by GI for jaundice and pancreatic mass causing CBD obstruction. CT abdomen showed pancreatic head mass (4.4 x 3.7 cm) and enlarged portocaval lymph nodes. 02/27/2021: ERCP with placement of a nonmetallic biliary stent. FNA of pancreatic head showed malignant cells consistent with pancreatic adenocarcinoma. 03/05/2021: CT Chest showed no evidence of metastatic disease in the chest. 03/12/2021: establish care with Dr. Lorenso Courier 04/10/2021: Cycle 1 Day 1 of FOLFIRINOX 04/24/2021: Cycle 2 Day 1 of FOLFIRINOX 05/22/2021: Cycle 3 Day 1 of FOLFIRINOX. Delayed x 2 weeks due to hospitalization for poor po intake and dehydration due to gastroparesis. 06/05/2021: Cycle 4 Day 1 of FOLFIRINOX 06/19/2021: Cycle 5 Day 1 of FOLFIRINOX 07/02/2021: Cycle 6 Day 1 of FOLFIRINOX 07/16/2021: Cycle 7 Day 1 of FOLFIRINOX  07/30/2021: Cycle 8 Day 1 of FOLFIRINOX  08/13/2021: Cycle 9 Day 1 of FOLFIRINOX  08/27/2021:  Cycle 10 Day 1 of FOLFIRINOX  09/10/2021: Cycle 11 Day 1 of FOLFIRINOX  09/24/2021: Cycle 12 Day 1 of FOLFIRINOX  10/28/2021: Underwent open whipple with portal vein reconstruction utilizing a R deep FV graft at [facility]. Positive margins noted.   Interval History:  [name] [name] 61 y.o. male returns for a follow up visit for pancreatic cancer. He was last seen on 11/24/2021. In the interim since his last visit he had an abscess drainage performed by surgery 12/12/2021.   On exam today [name] [name] notes he has been feeling considerably better after his discharge from [facility] on 01/17/2022.  He reports his pain is under better control.  He reports that his appetite is improving and that he is no longer taking laxatives.  He is also stopped taking his Creon as his stools are no longer "greasy".  He reports that he did have a low-grade temperature last night of 99.6 F and some slight chills yesterday.  He reports he continues to take his Augmentin.  His weight unfortunately is down to 1 to 29 pounds from 134 at his last visit.  He is not having any nausea, vomiting, or diarrhea.  He reports his pain is currently at a 5 or 6 out of 10 and it overall is "not that bad".  He is not having any other systemic symptoms at this time. He denies any diarrhea, constipation, sweats, headache, or vision changes.  A full 10 point ROS is listed below.  MEDICAL HISTORY:  Past Medical History:  Diagnosis Date   Barrett's esophagus dx 2016   Bronchitis    Chronic hepatitis C without hepatic coma ([place]) 10/31/2014   Depression    Diabetes ([place])    ED (erectile dysfunction)    GERD (gastroesophageal reflux disease)    Hepatitis C    Hiatal hernia 10/2014   3cm   Neuropathy    pancreatic ca 12/2020   S/P transmetatarsal amputation of foot, left ([place]) 11/28/2020    SURGICAL HISTORY: Past Surgical History:  Procedure Laterality Date   AMPUTATION Left 11/15/2020   Procedure: LEFT TRANSMETATARSALS AMPUTATION;  Surgeon: Newt Minion, MD;  Location: [facility];  Service: Orthopedics;  Laterality: Left;   APPENDECTOMY     BILIARY STENT  PLACEMENT N/A 02/27/2021   Procedure: BILIARY STENT PLACEMENT;  Surgeon: Milus Banister, MD;  [facility]: [facility];  Service: Endoscopy;  Laterality: N/A;   BILIARY STENT PLACEMENT N/A 03/27/2021   Procedure: BILIARY STENT PLACEMENT;  Surgeon: Jackquline Denmark, MD;  Location: [facility];  Service: Endoscopy;  Laterality: N/A;   BIOPSY  03/27/2021   Procedure: BIOPSY;  Surgeon: Jackquline Denmark, MD;  [facility];  Service: Endoscopy;;    COLONOSCOPY N/A 02/16/2014   Procedure: COLONOSCOPY;  Surgeon: Gatha Mayer, MD;  [facility];  Service: Endoscopy;  Laterality: N/A;   COLONOSCOPY     ENDOSCOPIC RETROGRADE CHOLANGIOPANCREATOGRAPHY (ERCP) WITH PROPOFOL N/A 02/27/2021   Procedure: ENDOSCOPIC RETROGRADE CHOLANGIOPANCREATOGRAPHY (ERCP) WITH PROPOFOL;  Surgeon: Milus Banister, MD;  [facility];  Service: Endoscopy;  Laterality: N/A;   ERCP N/A 03/27/2021   Procedure: ENDOSCOPIC RETROGRADE CHOLANGIOPANCREATOGRAPHY (ERCP);  Surgeon: Jackquline Denmark, MD;  Location[facility];  Service: Endoscopy;  Laterality: N/A;   ESOPHAGOGASTRODUODENOSCOPY (EGD) WITH PROPOFOL N/A 05/06/2021   Procedure: ESOPHAGOGASTRODUODENOSCOPY (EGD) WITH PROPOFOL;  Surgeon: Mauri Pole, MD;  [facility];  Service: Endoscopy;  Laterality: N/A;   EUS N/A 02/27/2021   Procedure: UPPER ENDOSCOPIC ULTRASOUND (EUS) RADIAL;  Surgeon: Milus Banister, MD;  [facility];  Service: Endoscopy;  Laterality: N/A;   FINE NEEDLE ASPIRATION N/A 02/27/2021   Procedure: FINE NEEDLE ASPIRATION (FNA) LINEAR;  Surgeon: Milus Banister, MD;  [facility];  Service: Endoscopy;  Laterality: N/A;   IR IMAGING GUIDED PORT INSERTION  03/21/2021   SPHINCTEROTOMY  02/27/2021   Procedure: SPHINCTEROTOMY;  Surgeon: Milus Banister, MD;  [facility];  Service: Endoscopy;;   STENT REMOVAL  03/27/2021   Procedure: STENT REMOVAL;  Surgeon: Jackquline Denmark, MD;  [facility];  Service: Endoscopy;;   TONSILLECTOMY      SOCIAL HISTORY: Social History   Socioeconomic History   Marital status: Single    Spouse name: Not on file   Number of children: Not on file   Years of education: Not on file   Highest education level: Not on file  Occupational History   Not on file  Tobacco Use   Smoking status: Every Day    Packs/day: 0.50    Years: 35.00    Pack years: 17.50    Types: Cigarettes   Smokeless tobacco:  Never   Tobacco comments:    Currently being followed by the smoking cessation program   Vaping Use   Vaping Use: Never used  Substance and Sexual Activity   Alcohol use: Not Currently    Comment: previous   Drug use: No   Sexual activity: Not on file  Other Topics Concern   Not on file  Social History Narrative   Not on file   Social Determinants of Health   Financial Resource Strain: High Risk   Difficulty of Paying Living Expenses: Very hard  Food Insecurity: Food Insecurity Present   Worried About Running Out of Food in the Last Year: Sometimes true   Ran Out of Food in the Last Year: Sometimes true  Transportation Needs: Unmet Transportation Needs   Lack of Transportation (Medical): Yes   Lack of Transportation (Non-Medical): Yes  Physical Activity: Not on file  Stress: Not on file  Social Connections: Not on file  Intimate Partner Violence: Not At Risk   Fear of Current or Ex-Partner: No   Emotionally Abused: No   Physically Abused: No   Sexually Abused:  No    FAMILY HISTORY: Family History  Problem Relation Age of Onset   Breast cancer Mother    Stroke Father    Alcohol abuse Father    Heart disease Maternal Grandfather    Pancreatic cancer Paternal Grandmother    Diabetes Paternal Grandfather    Colon cancer Neg Hx    Stomach cancer Neg Hx    Rectal cancer Neg Hx    Esophageal cancer Neg Hx     ALLERGIES:  is allergic to bee venom and lactose intolerance (gi).  MEDICATIONS:  Current Outpatient Medications  Medication Sig Dispense Refill   Accu-Chek Softclix Lancets lancets Check blood glucose level by fingerstick 3-4 times per day. 100 each 3   acetaminophen (TYLENOL) 500 MG tablet Take 500 mg by mouth every 6 (six) hours as needed for moderate pain.     albuterol (VENTOLIN HFA) 108 (90 Base) MCG/ACT inhaler Inhale 2 puffs into the lungs every 6 (six) hours as needed for wheezing or shortness of breath. (Patient taking differently: Inhale 1 puff into  the lungs every 6 (six) hours as needed for shortness of breath.) 18 g 0   aspirin 81 MG EC tablet Take 1 tablet by mouth daily. 100 tablet 0   atorvastatin (LIPITOR) 20 MG tablet Take 1 tablet (20 mg total) by mouth daily. 90 tablet 0   Blood Glucose Monitoring Suppl (ACCU-CHEK GUIDE) w/Device KIT Check blood glucose level by fingerstick 3-4 times per day. 1 kit 0   chlorhexidine (PERIDEX) 0.12 % solution Rinse between teeth using irrigation syringe (15 mL) 2-3 times a day or as needed for gum irritation. (Patient taking differently: Use as directed 5 mLs in the mouth or throat daily as needed (for gum irriation).) 473 mL 2   Continuous Blood Gluc Receiver (FREESTYLE LIBRE 2 READER) DEVI Use as instructed. Check blood glucose level by fingerstick 4 - 5 per day. 1 each 1   Continuous Blood Gluc Sensor (FREESTYLE LIBRE 14 DAY SENSOR) MISC Use as instructed. Check blood glucose level by fingerstick 4-5 per day. 1 each 6   Continuous Blood Gluc Sensor (FREESTYLE LIBRE 2 SENSOR) MISC Use as directed. 1 each 6   diphenoxylate-atropine (LOMOTIL) 2.5-0.025 MG tablet Take 1 tablet by mouth 4 (four) times daily as needed for diarrhea or loose stools. 60 tablet 1   DULoxetine (CYMBALTA) 60 MG capsule Take 1 capsule (60 mg total) by mouth daily. 90 capsule 0   EPINEPHrine 0.3 mg/0.3 mL IJ SOAJ injection Inject 0.3 mg into the muscle once as needed for anaphylaxis.     fluconazole (DIFLUCAN) 200 MG tablet Take 2 tablets (400 mg total) by mouth daily. 3 tablet 0   fluconazole (DIFLUCAN) 200 MG tablet Take 2 tablets by mouth daily. 4 tablet 0   folic acid (FOLVITE) 1 MG tablet Take 1 tablet (1 mg total) by mouth in the morning.     glucose blood (ACCU-CHEK GUIDE) test strip Check blood glucose level by fingerstick 3 - 4 times per day. 100 each 12   Insulin NPH, Human,, Isophane, (HUMULIN N KWIKPEN) 100 UNIT/ML Kiwkpen Inject 35 Units into the skin every morning. (Patient taking differently: Inject 35 Units into the  skin daily.) 45 mL 3   Insulin Pen Needle (TRUEPLUS PEN NEEDLES) 32G X 4 MM MISC Use as instructed. Inject into the skin twice daily 200 each 6   Insulin Pen Needle 32G X 4 MM MISC Use as directed with humulin kwikpen. 100 each 3  Insulin Syringe-Needle U-100 (TRUEPLUS INSULIN SYRINGE) 31G X 5/16" 0.3 ML MISC Use to inject insulin once daily. 100 each 6   lidocaine-prilocaine (EMLA) cream Apply 1 application topically as directed as needed. 30 g 0   lipase/protease/amylase (CREON) 12000-38000 units CPEP capsule Take 32,202-54,270 Units by mouth See admin instructions. Take 2 capsules by mouth daily with meals and may take 1 capsule by mouth as needed with snakes.     losartan (COZAAR) 50 MG tablet Take 1 tablet (50 mg total) by mouth daily. 90 tablet 1   metoCLOPramide (REGLAN) 5 MG tablet Take 1 tablet (5 mg total) by mouth 4 (four) times daily -  before meals and at bedtime. (Patient not taking: Reported on 01/05/2022) 90 tablet 1   mirtazapine (REMERON) 30 MG tablet Take 1 tablet (30 mg total) by mouth at bedtime. 90 tablet 1   Multiple Vitamins-Minerals (THEREMS-M) TABS Take 1 tablet by mouth in the morning. (Patient taking differently: Take 1 tablet by mouth daily.) 90 tablet 3   nicotine (NICODERM CQ - DOSED IN MG/24 HOURS) 14 mg/24hr patch Apply one patch daily x 12 weeks. 84 patch 1   ondansetron (ZOFRAN) 8 MG tablet Take 1 tablet (8 mg total) by mouth 2 (two) times daily as needed. Start on day 3 after chemotherapy. (Patient taking differently: Take 8 mg by mouth 2 (two) times daily as needed for nausea.) 30 tablet 1   oxyCODONE (OXY IR/ROXICODONE) 5 MG immediate release tablet Take 1 to 2 tablets by mouth every 6 hours as needed for severe pain. 120 tablet 0   oxyCODONE ER (XTAMPZA ER) 9 MG C12A Take 1 capsule by mouth every 12 hours. 60 capsule 0   pantoprazole (PROTONIX) 40 MG tablet Take 1 tablet (40 mg total) by mouth daily. 90 tablet 0   polyethylene glycol powder (GLYCOLAX/MIRALAX) 17  GM/SCOOP powder Take 17 g by mouth at bedtime. (Patient taking differently: Take 17 g by mouth daily as needed for mild constipation.) 238 g 0   prochlorperazine (COMPAZINE) 10 MG tablet Take 1 tablet (10 mg total) by mouth every 6 (six) hours as needed for nausea or vomiting. (Patient taking differently: Take 10 mg by mouth every 6 (six) hours as needed for vomiting or nausea.) 30 tablet 1   Syringe, Disposable, 3 ML MISC Use As Directed 100 each 4   varenicline (APO-VARENICLINE) 1 MG tablet Take 1 tablet by mouth once daily in the morning with food (Patient taking differently: Take 1 mg by mouth daily.) 84 tablet 1   No current facility-administered medications for this visit.    REVIEW OF SYSTEMS:   Constitutional: ( - ) fevers, ( - )  chills , ( - ) night sweats Eyes: ( - ) blurriness of vision, ( - ) double vision, ( - ) watery eyes Ears, nose, mouth, throat, and face: ( - ) mucositis, ( - ) sore throat Respiratory: ( - ) cough, ( - ) dyspnea, ( - ) wheezes Cardiovascular: ( - ) palpitation, ( - ) chest discomfort, ( - ) lower extremity swelling Gastrointestinal:  ( +) nausea, ( - ) heartburn, ( - ) change in bowel habits Skin: ( - ) abnormal skin rashes Lymphatics: ( - ) new lymphadenopathy, ( - ) easy bruising Neurological: ( - ) numbness, ( - ) tingling, ( - ) new weaknesses Behavioral/Psych: ( - ) mood change, ( - ) new changes  All other systems were reviewed with the patient and are negative.  PHYSICAL EXAMINATION: ECOG PERFORMANCE STATUS: 1 - Symptomatic but completely ambulatory  Vitals:   01/20/22 1105  BP: 114/83  Pulse: (!) 116  Resp: 17  Temp: (!) 97 F (36.1 C)  SpO2: 96%     Filed Weights   01/20/22 1105  Weight: 129 lb 3.2 oz (58.6 kg)    GENERAL: Well-appearing middle-age Caucasian male, alert, no distress and comfortable HEAD: Anterior laceration of the scalp, well healed  SKIN: skin color, texture, turgor are normal, no rashes or significant lesions.   EYES: conjunctiva are pink and non-injected, sclera clear LUNGS: clear to auscultation and percussion with normal breathing effort HEART: regular rate & rhythm and no murmurs and no lower extremity edema ABDOMEN: Midline incision healing but there clear discharge. Well healing lesion on right abdomen.  EXTREMITIES: wound currently dressed.  PSYCH: alert & oriented x 3, fluent speech NEURO: no focal motor/sensory deficits  LABORATORY DATA:  I have reviewed the data as listed    Latest Ref Rng & Units 01/20/2022   10:57 AM 01/13/2022    7:34 AM 01/06/2022    5:28 AM  CBC  WBC 4.0 - 10.5 K/uL 25.0   16.3   9.4    Hemoglobin 13.0 - 17.0 g/dL 10.4   11.1   10.1    Hematocrit 39.0 - 52.0 % 31.7   34.4   30.5    Platelets 150 - 400 K/uL 408   473   261         Latest Ref Rng & Units 01/20/2022   10:57 AM 01/13/2022    7:34 AM 01/06/2022    5:28 AM  CMP  Glucose 70 - 99 mg/dL 269   335   102    BUN 6 - 20 mg/dL 12   <5   6    Creatinine 0.61 - 1.24 mg/dL 0.46   0.40   0.37    Sodium 135 - 145 mmol/L 128   131   133    Potassium 3.5 - 5.1 mmol/L 3.8   3.9   3.4    Chloride 98 - 111 mmol/L 96   94   96    CO2 22 - 32 mmol/L '28   25   31    ' Calcium 8.9 - 10.3 mg/dL 8.6   8.4   8.9    Total Protein 6.5 - 8.1 g/dL 7.5   7.7   6.6    Total Bilirubin 0.3 - 1.2 mg/dL 0.6   1.3   0.4    Alkaline Phos 38 - 126 U/L 223   341   145    AST 15 - 41 U/L '17   19   20    ' ALT 0 - 44 U/L '13   24   12     ' PATHOLOGY:  Procedure:    Pancreaticoduodenectomy (Whipple resection), partial pancreatectomy   TUMOR     Tumor Site:    Pancreatic head     Histologic Type:    Ductal adenocarcinoma (NOS)     Histologic Grade:    G2, moderately differentiated     Tumor Size:    Greatest Dimension (Centimeters): 3.7 cm       Additional Dimension (Centimeters):    2.6 cm       Additional Dimension (Centimeters):    2.1 cm     Site(s) Involved by Direct Tumor Extension:    Ampulla of Vater or sphincter of Oddi      Site(s)  Involved by Direct Tumor Extension:    Duodenal wall     Site(s) Involved by Direct Tumor Extension:    Superior mesenteric vein     Treatment Effect:    Absent, with extensive residual cancer and no evident tumor regression (poor or no response, score 3)     Lymphovascular Invasion:    Present     Perineural Invasion:    Present   MARGINS     Margin Status for Invasive Carcinoma:    Invasive carcinoma present at margin       Margin(s) Involved by Invasive Carcinoma:    Pancreatic neck / parenchymal       Margin(s) Involved by Invasive Carcinoma:    Uncinate (retroperitoneal / superior mesenteric artery)     Margin Status for Dysplasia and Intraepithelial Neoplasia:    All margins negative for dysplasia and intraepithelial neoplasia   REGIONAL LYMPH NODES     Regional Lymph Node Status:           :    All regional lymph nodes negative for tumor       Number of Lymph Nodes Examined:    36   PATHOLOGIC STAGE CLASSIFICATION  (pTNM, AJCC 8th Edition)     Reporting of pT, pN, and (when applicable) pM categories is based on information available to the pathologist at the time the report is issued. As per the AJCC (Chapter 1, 8th Ed.) it is the managing physician's responsibility to establish the final pathologic stage based upon all pertinent information, including but potentially not limited to this pathology report.     TNM Descriptors:    y (post-treatment)     pT Category:    pT2     pN Category:    pN0   RADIOGRAPHIC STUDIES: I have personally reviewed the radiological images as listed and agreed with the findings in the report: Borderline resectable pancreatic mass with no evidence of metastatic disease. DG Chest 2 View  Result Date: 01/05/2022 CLINICAL DATA:  61 year old male with shortness of breath and right lower chest pain since yesterday. Status post Whipple in March. EXAM: CHEST - 2 VIEW COMPARISON:  [facility] portable chest 11/02/2021 and earlier. FINDINGS: PA and lateral views.  Larger lung volumes. Stable right chest power port. Normal cardiac size and mediastinal contours. Visualized tracheal air column is within normal limits. Both lungs appear clear. No pneumothorax or pleural effusion. Partially visible surgical clips in the right upper quadrant. Negative visible bowel gas. No acute osseous abnormality identified. IMPRESSION: No acute cardiopulmonary abnormality. Electronically Signed   By: Genevie Ann M.D.   On: 01/05/2022 10:05   CT Angio Chest PE W and/or Wo Contrast  Result Date: 01/05/2022 CLINICAL DATA:  Pulmonary embolism (PE) suspected, high prob Known cancer, recent procedure, tachycardia, tachypnea, worsened exertional shortness of breath. Rule out PE versus recurrent abscess below his diaphragm EXAM: CT ANGIOGRAPHY CHEST WITH CONTRAST TECHNIQUE: Multidetector CT imaging of the chest was performed using the standard protocol during bolus administration of intravenous contrast. Multiplanar CT image reconstructions and MIPs were obtained to evaluate the vascular anatomy. RADIATION DOSE REDUCTION: This exam was performed according to the departmental dose-optimization program which includes automated exposure control, adjustment of the mA and/or kV according to patient size and/or use of iterative reconstruction technique. CONTRAST:  181m OMNIPAQUE IOHEXOL 350 MG/ML SOLN COMPARISON:  CT 12/25/2021. FINDINGS: Cardiovascular: Satisfactory opacification of the pulmonary arteries to the segmental level. No evidence of pulmonary embolism. Normal cardiac size.Small pericardial effusion, increased  since 12/25/2021. Moderate atherosclerosis of the thoracic aorta. Chest port catheter tip terminates in the right atrium. Mediastinum/Nodes: No lymphadenopathy. Lungs/Pleura: No focal airspace disease. Left basilar scarring/linear atelectasis. No suspicious pulmonary nodules. Apical centrilobular and paraseptal emphysema. Upper Abdomen: No acute abnormality. Musculoskeletal: No chest wall  abnormality. No acute or significant osseous findings. Review of the MIP images confirms the above findings. IMPRESSION: Increased, small pericardial effusion in comparison to prior. No pulmonary embolism or focal airspace consolidation. Apical centrilobular and paraseptal emphysema. Electronically Signed   By: Maurine Simmering M.D.   On: 01/05/2022 12:53   CT ABDOMEN PELVIS W CONTRAST  Result Date: 01/13/2022 CLINICAL DATA:  Abdominal pain. Low-grade fever. Pancreatic adenocarcinoma. * Tracking Code: BO * will procedure. EXAM: CT ABDOMEN AND PELVIS WITH CONTRAST TECHNIQUE: Multidetector CT imaging of the abdomen and pelvis was performed using the standard protocol following bolus administration of intravenous contrast. RADIATION DOSE REDUCTION: This exam was performed according to the departmental dose-optimization program which includes automated exposure control, adjustment of the mA and/or kV according to patient size and/or use of iterative reconstruction technique. CONTRAST:  119m OMNIPAQUE IOHEXOL 300 MG/ML  SOLN COMPARISON:  CT 01/05/2022 FINDINGS: Lower chest: Bibasilar linear atelectasis. No airspace disease. No pleural fluid. Hepatobiliary: Small subcapsular low-density collection along the RIGHT hepatic lobe measures 2.3 by 1.2 cm (image 49/2) compared to 3.1 x 1.2 cm. There is interval evacuation of the gas within the collection seen on prior. There is a percutaneous drainage catheter inferior to this collection unchanged from prior. Small gas pocket adjacent to the tip of the catheter. No new hepatic collections are present. No biliary duct dilatation. No complication associated with the choledocho enterostomy. Pancreas: Post pancreatectomy. Spleen: Normal spleen. Adrenals/urinary tract: Adrenal glands and kidneys are normal. Bladder normal. Stomach/Bowel: Partial gastrectomy. Stomach normal otherwise. Small bowel normal. Appendix not identified. Moderate volume stool in the RIGHT colon. The colon and  rectosigmoid colon are normal. Vascular/Lymphatic: Abdominal aorta is normal caliber with atherosclerotic calcification. There is no retroperitoneal or periportal lymphadenopathy. No pelvic lymphadenopathy. Musculoskeletal: No aggressive osseous lesion IMPRESSION: 1. No significant interval change from CT 01/05/2022. 2. Small collection along the subcapsular RIGHT hepatic lobe with drainage catheter in place. Decreasing gas within the collection. 3. Post Whipple type procedure without complication. Electronically Signed   By: SSuzy BouchardM.D.   On: 01/13/2022 10:25   CT ABDOMEN PELVIS W CONTRAST  Result Date: 01/05/2022 CLINICAL DATA:  Postop abdominal pain, evaluate for abscess. Pancreatic adenocarcinoma. * Tracking Code: BO * EXAM: CT ABDOMEN AND PELVIS WITH CONTRAST TECHNIQUE: Multidetector CT imaging of the abdomen and pelvis was performed using the standard protocol following bolus administration of intravenous contrast. RADIATION DOSE REDUCTION: This exam was performed according to the departmental dose-optimization program which includes automated exposure control, adjustment of the mA and/or kV according to patient size and/or use of iterative reconstruction technique. CONTRAST:  773mOMNIPAQUE IOHEXOL 300 MG/ML  SOLN COMPARISON:  CT chest 01/05/2022, CT abdomen pelvis 12/25/2021. FINDINGS: Lower chest: Minimal subsegmental atelectasis in the lung bases. Heart size normal. Small pericardial effusion with hyperattenuation of the pericardial border. Atherosclerotic calcification of the aorta and coronary arteries. No pleural fluid. Distal esophagus is grossly unremarkable. Hepatobiliary: Liver is slightly decreased in attenuation diffusely. Loculated perihepatic fluid collection has decreased in size along the anterolateral aspect of the liver, now with the largest pocket measuring 1.2 x 3.1 cm (2/31). Indwelling percutaneous drain. Cholecystectomy. No biliary ductal dilatation. Pancreas:  Pancreaticoduodenectomy. Slight pancreatic ductal dilatation in  the body, 4 mm, similar. Remainder of the gland is unremarkable. Spleen: Negative. Adrenals/Urinary Tract: Adrenal glands are unremarkable. Millimetric low-attenuation lesions in the right kidney are too small to characterize. No follow-up necessary. Kidneys are otherwise unremarkable. Ureters are decompressed. Bladder is grossly unremarkable. Stomach/Bowel: Stomach is unremarkable. Pancreaticoduodenectomy. Small bowel is otherwise unremarkable. Appendix is not readily appreciated. Fair amount of stool in the colon. Vascular/Lymphatic: Atherosclerotic calcification of the aorta. No pathologically enlarged lymph nodes. Reproductive: Prostate is visualized. Other: No free fluid.  Mesenteries and peritoneum are unremarkable. Musculoskeletal: Degenerative changes in the spine. No worrisome lytic or sclerotic lesions. Minimal grade 1 anterolisthesis of L4 on L5. IMPRESSION: 1. Small pericardial effusion. Hyperattenuation of the pericardium suggest associated pericarditis. 2. Decreasing loculated subcapsular fluid along the liver margin, with percutaneous drain in place. 3. Hepatic steatosis. 4. Fair amount of stool in the colon is indicative of constipation. 5. Aortic atherosclerosis (ICD10-I70.0). Coronary artery calcification. Electronically Signed   By: Lorin Picket M.D.   On: 01/05/2022 13:50   CT ABDOMEN PELVIS W CONTRAST  Result Date: 12/25/2021 CLINICAL DATA:  Intra-abdominal abscess? Recent Whipple EXAM: CT ABDOMEN AND PELVIS WITH CONTRAST TECHNIQUE: Multidetector CT imaging of the abdomen and pelvis was performed using the standard protocol following bolus administration of intravenous contrast. RADIATION DOSE REDUCTION: This exam was performed according to the departmental dose-optimization program which includes automated exposure control, adjustment of the mA and/or kV according to patient size and/or use of iterative reconstruction  technique. CONTRAST:  169m OMNIPAQUE IOHEXOL 300 MG/ML  SOLN COMPARISON:  CT abdomen pelvis 07/15/2021 FINDINGS: Lower chest: No acute abnormality. Hepatobiliary: No focal liver abnormality is seen. Status post cholecystectomy. No biliary dilatation. Pancreas: Interval resection of previously seen pancreatic head mass. No significant residual dilatation of the pancreatic duct within the residual pancreatic tail and body. Interval removal of metallic biliary stent. Spleen: Normal in size without focal abnormality. Adrenals/Urinary Tract: Adrenal glands are unremarkable. Kidneys are normal, without renal calculi, focal lesion, or hydronephrosis. Bladder is unremarkable. Stomach/Bowel: Postsurgical changes of Whipple are seen. No bowel dilatation to indicate ileus or obstruction. Appendix is not definitely identified. Vascular/Lymphatic: Atherosclerotic calcifications seen scattered throughout the abdominal aorta and visualized branches. No enlarged abdominal or pelvic lymph nodes. Reproductive: Prostate is unremarkable. Other: No significant normality of the abdominal wall. There is a lenticular fluid collection adjacent to the inferior tip of the right hepatic lobe measuring 7.6 x 2.5 x 2.2 cm, consistent with abscess given its enhancing rim. Is slightly smaller collection located closer to the midline of the abdomen, adjacent to the inferior tip of the left hepatic lobe measures 4.5 x 1.3 x 3.0 cm which is also abscess. These 2 collections appear to be connecting through a small channel. Musculoskeletal: No acute or significant osseous findings. IMPRESSION: 1. Two perihepatic fluid collections measuring 7.6 x 2.5 x 2.2 cm and 4.5 x 1.3 x 3.0 cm are consistent with abscesses. These collections appear to be communicating through a narrow channel. 2. Postsurgical changes of Whipple. No evidence of bowel obstruction. Electronically Signed   By: FMiachel RouxM.D.   On: 12/25/2021 10:14   ECHOCARDIOGRAM  COMPLETE  Result Date: 01/05/2022    ECHOCARDIOGRAM REPORT   Patient Name:   [name] MACDONNELLDate of Exam: 01/05/2022 Medical Rec #:  [id number]      Height:       71.0 in Accession #:    [id number]     Weight:  130.0 lb Date of Birth:  04-Aug-1961       BSA:          1.756 m Patient Age:    40 years        BP:           124/87 mmHg Patient Gender: M               HR:           107 bpm. Exam Location:  Inpatient Procedure: 2D Echo, Cardiac Doppler and Color Doppler STAT ECHO Indications:    I31.3 Pericardial effusion (noninflammatory)  History:        Patient has prior history of Echocardiogram examinations, most                 recent 10/30/2020. Abnormal ECG, Arrythmias:Tachycardia,                 Signs/Symptoms:Dyspnea and Shortness of Breath; Risk                 Factors:Current Smoker, Hypertension and Diabetes. ETOH. Cancer.  Sonographer:    Roseanna Rainbow RDCS Referring Phys: [id number] [place]  Sonographer Comments: Patient drinking grape juice during stat echo IMPRESSIONS  1. Left ventricular ejection fraction, by estimation, is 60 to 65%. The left ventricle has normal function. The left ventricle has no regional wall motion abnormalities. Left ventricular diastolic parameters are consistent with Grade I diastolic dysfunction (impaired relaxation).  2. Right ventricular systolic function is normal. The right ventricular size is normal.  3. Moderate pericardial effusion. The pericardial effusion is circumferential. There is no evidence of cardiac tamponade.  4. The mitral valve is normal in structure. No evidence of mitral valve regurgitation. No evidence of mitral stenosis.  5. The aortic valve is normal in structure. Aortic valve regurgitation is not visualized. No aortic stenosis is present.  6. The inferior vena cava is normal in size with greater than 50% respiratory variability, suggesting right atrial pressure of 3 mmHg. FINDINGS  Left Ventricle: Left ventricular ejection fraction, by  estimation, is 60 to 65%. The left ventricle has normal function. The left ventricle has no regional wall motion abnormalities. The left ventricular internal cavity size was normal in size. There is  no left ventricular hypertrophy. Left ventricular diastolic parameters are consistent with Grade I diastolic dysfunction (impaired relaxation). Right Ventricle: The right ventricular size is normal. No increase in right ventricular wall thickness. Right ventricular systolic function is normal. Left Atrium: Left atrial size was normal in size. Right Atrium: Right atrial size was normal in size. Pericardium: A moderately sized pericardial effusion is present. The pericardial effusion is circumferential. There is no evidence of cardiac tamponade. Mitral Valve: The mitral valve is normal in structure. No evidence of mitral valve regurgitation. No evidence of mitral valve stenosis. Tricuspid Valve: The tricuspid valve is normal in structure. Tricuspid valve regurgitation is not demonstrated. No evidence of tricuspid stenosis. Aortic Valve: The aortic valve is normal in structure. Aortic valve regurgitation is not visualized. No aortic stenosis is present. Pulmonic Valve: The pulmonic valve was normal in structure. Pulmonic valve regurgitation is not visualized. No evidence of pulmonic stenosis. Aorta: The aortic root is normal in size and structure. Venous: The inferior vena cava is normal in size with greater than 50% respiratory variability, suggesting right atrial pressure of 3 mmHg. IAS/Shunts: No atrial level shunt detected by color flow Doppler.  LEFT VENTRICLE PLAX 2D LVIDd:  3.50 cm     Diastology LVIDs:         1.90 cm     LV e' medial:    6.53 cm/s LV PW:         1.20 cm     LV E/e' medial:  10.8 LV IVS:        1.00 cm     LV e' lateral:   5.87 cm/s                            LV E/e' lateral: 12.0  LV Volumes (MOD) LV vol d, MOD A2C: 41.1 ml LV vol d, MOD A4C: 54.0 ml LV vol s, MOD A2C: 12.0 ml LV vol s, MOD  A4C: 13.3 ml LV SV MOD A2C:     29.1 ml LV SV MOD A4C:     54.0 ml LV SV MOD BP:      34.5 ml RIGHT VENTRICLE             IVC RV S prime:     11.90 cm/s  IVC diam: 1.95 cm TAPSE (M-mode): 1.7 cm LEFT ATRIUM           Index       RIGHT ATRIUM          Index LA diam:      2.60 cm 1.48 cm/m  RA Area:     9.02 cm LA Vol (A2C): 8.7 ml  4.95 ml/m  RA Volume:   17.50 ml 9.97 ml/m LA Vol (A4C): 7.4 ml  4.23 ml/m  AORTIC VALVE LVOT Vmax:   98.50 cm/s LVOT Vmean:  70.600 cm/s LVOT VTI:    0.167 m  AORTA Ao Root diam: 3.60 cm MITRAL VALVE MV Area (PHT): 4.89 cm    SHUNTS MV Decel Time: 155 msec    Systemic VTI: 0.17 m MV E velocity: 70.70 cm/s MV A velocity: 87.00 cm/s MV E/A ratio:  0.81 Candee Furbish MD Electronically signed by Candee Furbish MD Signature Date/Time: 01/05/2022/4:23:33 PM    Final    ECHOCARDIOGRAM LIMITED  Result Date: 01/12/2022    ECHOCARDIOGRAM LIMITED REPORT   Patient Name:   [name] [name] Date of Exam: 01/12/2022 Medical Rec #:  [id number]       Height:       71.0 in Accession #:    [id number]      Weight:       127.0 lb Date of Birth:  [birth year]       BSA:          1.738 m Patient Age:    70 years        BP:           139/80 mmHg Patient Gender: M               HR:           102 bpm. Exam Location:  [address] Procedure: Limited Echo, Cardiac Doppler and Limited Color Doppler Indications:    I31.3 Pericardial Effusion  History:        Patient has prior history of Echocardiogram examinations, most                 recent 01/05/2022. AKI; Risk Factors:Hypertension, Diabetes and                 HLD.  Sonographer:    Marygrace Drought RCS Referring Phys: [id number] [facility] [place] Sells  IMPRESSIONS  1. Small to moderate pericardial effusion which is unchanged from prior to findings to suggest tamponade. Moderate pericardial effusion. The pericardial effusion is circumferential. There is no evidence of cardiac tamponade.  2. Left ventricular ejection fraction, by estimation, is 65 to 70%. The left ventricle  has normal function. The left ventricle has no regional wall motion abnormalities.  3. Right ventricular systolic function is normal. The right ventricular size is normal.  4. The mitral valve is grossly normal. Trivial mitral valve regurgitation. No evidence of mitral stenosis.  5. The aortic valve is tricuspid. Aortic valve regurgitation is not visualized. Comparison(s): No significant change from prior study. FINDINGS  Left Ventricle: Left ventricular ejection fraction, by estimation, is 65 to 70%. The left ventricle has normal function. The left ventricle has no regional wall motion abnormalities. The left ventricular internal cavity size was normal in size. There is  no left ventricular hypertrophy. Right Ventricle: The right ventricular size is normal. No increase in right ventricular wall thickness. Right ventricular systolic function is normal. Left Atrium: Left atrial size was normal in size. Right Atrium: Right atrial size was normal in size. Pericardium: Small to moderate pericardial effusion which is unchanged from prior to findings to suggest tamponade. A moderately sized pericardial effusion is present. The pericardial effusion is circumferential. There is no evidence of cardiac tamponade. Mitral Valve: The mitral valve is grossly normal. Trivial mitral valve regurgitation. No evidence of mitral valve stenosis. Aortic Valve: The aortic valve is tricuspid. Aortic valve regurgitation is not visualized. IAS/Shunts: The atrial septum is grossly normal. LEFT VENTRICLE PLAX 2D LVIDd:         3.20 cm Diastology LVIDs:         2.10 cm LV e' medial:    7.07 cm/s LV PW:         1.00 cm LV E/e' medial:  10.5 LV IVS:        1.00 cm LV e' lateral:   4.13 cm/s                        LV E/e' lateral: 18.0  LEFT ATRIUM         Index LA diam:    3.10 cm 1.78 cm/m  MITRAL VALVE MV Area (PHT): MV Decel Time: MV E velocity: 74.40 cm/s MV A velocity: 76.50 cm/s MV E/A ratio:  0.97 Eleonore Chiquito MD Electronically signed by  Eleonore Chiquito MD Signature Date/Time: 01/12/2022/12:21:49 PM    Final     ASSESSMENT & PLAN [name] 61 y.o. male with medical history significant for borderline resectable pancreatic cancer presents for a follow up visit.   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  The patient does still have an elevation in bilirubin which likely represents a decline from the time of the latest stent placement.  He was evaluated by surgery who determined this is a borderline resectable tumor.  We will plan to start the patient on neoadjuvant chemotherapy.  The 2 options would be Gem/Abraxane or FOLFIRINOX.  Given his excellent functional status I do believe he would be a good candidate for FOLFIRINOX.  He completed 12 cycles of neoadjuvant FOLFIRINOX.  He underwent open whipple with portal vein reconstruction utilizing a R deep FV graft on 10/28/2021 at [facility].   # Adenocarcinoma of the Pancreas, Stage IIB -- At this  time disease appears borderline resectable.  We will proceed with neoadjuvant chemotherapy.  At this time would prefer a regimen of FOLFIRINOX given his good baseline health. -- case reviewed by surgery, who agrees his disease is borderline resectable.  -- baseline elevations in both CA 19-9 and CEA. Continue to monitor during treatment.  -- Cycle 1 Day 1 of FOLFIRINOX started 04/10/2021. Cycle 12 Day 1 on 09/24/2021, completed neoadjuvant chemotherapy. --Underwent open whipple with portal vein reconstruction utilizing a R deep FV graft on 10/28/2021 at [facility]. Noted to have positive margins, adjuvant chemoradiation was recommended. Plan: --Regimen can be started when the patient's infection is under control he is better healed from his surgery.  This still remains a barrier. --will start chemoradiation with Xeloda as soon as is feasible per  Rad/onc --plan for Xeloda 850 mg/m2 on radiation days (5 days per week) over the course of radiation.  --Regimen can be started when the patient's infection is under control he is better healed from his surgery.  This still remains a barrier. --RTC in 2 weeks time to reassess with labs on Friday to trend WBC.  #Forehead laceration: --well healing, markedly improved.   #Post Surgical Infections --WBC is 25.0, ANC 20.3 -- Discussed with the patient's surgeon who is scheduling him for a telephone visit next Friday and another follow-up visit in person the Friday after that --Repeat labs to trend leukocytosis on Friday.  Strict return precautions for any concerning symptoms for worsening infection  #Gastroparesis #Nausea/Vomiting/Poor PO Intake --patient advanced diet to solid foods.  --nausea meds as below. --continue to work on glycemic control --continue to monitor.   #Chemotherapy induced Anemia and Thrombocytopenia --Hgb is stable at 10.4, stable. continue to monitor --Plt increased to 408 K --continue to monitor   # Pain Control -- Patient currently taking ibuprofen for his abdominal pain --continue oxycodone 10 mg every 6 hours as needed. He has consistently been taking 26m PO daily total. --continue Xtampza 9 mg q 12H   #Hyperglycemia: --today's glucose level is 269 --insulin-requiring type 2 DM --under the care of endocrinology  #Left foot wound: -- He has established care with wound care center -- Foot wound was observed today and does not appear to be actively infected.  No erythema or tenderness.  No purulent discharge.  #Supportive Care -- chemotherapy education complete -- port placed -- zofran [id number] PRN and compazine 1026mO q6H for nausea --Creon to help with loose stools. --lomotil q 6 H PRN for diarrhea.  -- EMLA cream for port  No orders of the defined types were placed in this encounter.   All questions were answered. The patient knows to call the  clinic with any problems, questions or concerns.  I have spent a total of 30 minutes minutes of face-to-face and non-face-to-face time, preparing to see the patient,  performing a medically appropriate examination, counseling and educating the patient, ordering tests/procedures, documenting clinical information in the electronic health record and care coordination.   JohLedell PeoplesD Department of Hematology/Oncology [place] [facility] LLCone: [phone]: [phone]: [name]

## 2022-01-21 ENCOUNTER — Ambulatory Visit (INDEPENDENT_AMBULATORY_CARE_PROVIDER_SITE_OTHER): Payer: Medicaid Other | Admitting: Internal Medicine

## 2022-01-21 ENCOUNTER — Telehealth: Payer: Self-pay | Admitting: Hematology and Oncology

## 2022-01-21 ENCOUNTER — Other Ambulatory Visit: Payer: Self-pay

## 2022-01-21 ENCOUNTER — Encounter: Payer: Self-pay | Admitting: Internal Medicine

## 2022-01-21 VITALS — BP 98/62 | HR 110 | Ht 71.0 in | Wt 131.4 lb

## 2022-01-21 DIAGNOSIS — Z794 Long term (current) use of insulin: Secondary | ICD-10-CM | POA: Diagnosis not present

## 2022-01-21 DIAGNOSIS — E785 Hyperlipidemia, unspecified: Secondary | ICD-10-CM

## 2022-01-21 DIAGNOSIS — E1169 Type 2 diabetes mellitus with other specified complication: Secondary | ICD-10-CM | POA: Diagnosis not present

## 2022-01-21 DIAGNOSIS — E1140 Type 2 diabetes mellitus with diabetic neuropathy, unspecified: Secondary | ICD-10-CM

## 2022-01-21 LAB — POCT GLYCOSYLATED HEMOGLOBIN (HGB A1C): Hemoglobin A1C: 9.1 % — AB (ref 4.0–5.6)

## 2022-01-21 MED ORDER — LYUMJEV KWIKPEN 100 UNIT/ML ~~LOC~~ SOPN
5.0000 [IU] | PEN_INJECTOR | Freq: Three times a day (TID) | SUBCUTANEOUS | 3 refills | Status: DC
  Filled 2022-01-21 – 2022-01-30 (×2): qty 15, 63d supply, fill #0
  Filled 2022-04-15: qty 15, 63d supply, fill #1

## 2022-01-21 MED ORDER — TRESIBA FLEXTOUCH 100 UNIT/ML ~~LOC~~ SOPN
24.0000 [IU] | PEN_INJECTOR | Freq: Every day | SUBCUTANEOUS | 3 refills | Status: DC
Start: 2022-01-21 — End: 2022-07-17
  Filled 2022-01-21: qty 9, 37d supply, fill #0
  Filled 2022-01-21: qty 21, 88d supply, fill #0
  Filled 2022-04-15: qty 9, 37d supply, fill #1

## 2022-01-21 NOTE — Telephone Encounter (Signed)
Per 5/23 los called and left message with appointment details and call back number

## 2022-01-21 NOTE — Progress Notes (Signed)
Patient ID: [name] [name], male   DOB: February 04, 1961, 61 y.o.   MRN: [id number]  HPI: [name] [name] is a 61 y.o.-year-old male, returning for follow-up for DM2, dx in 2014, insulin-dependent since 2021, uncontrolled, with complications (peripheral neuropathy, left transmetatarsal amputation, gastroparesis, ED). Pt. previously saw Dr. Loanne Drilling, last visit 3 months ago.  He is here with his friend.  Patient has a pertinent history of pancreatic cancer (head of the pancreas, dx. 2022).  He is status post biliary stents and then Whipple procedure 10/2021. He lost 80 lbs since last OV. He was just hospitalized >> high blood sugars. He just got the [employer] 2 CGM  Reviewed HbA1c: Lab Results  Component Value Date   HGBA1C 9.1 (A) 01/21/2022   HGBA1C 9.7 (A) 09/03/2021   HGBA1C 10.3 (A) 07/09/2021   HGBA1C 9.6 (H) 04/28/2021   HGBA1C 10.3 (H) 01/28/2021   HGBA1C 11.2 (H) 10/28/2020   HGBA1C 12.1 (H) 09/23/2020   HGBA1C 6.1 (H) 05/13/2020   HGBA1C 8.0 (H) 12/19/2019   HGBA1C 8.4 (H) 09/20/2019   Pt is on a regimen of: - NPH 20 units in a.m. and 10 units at night >> now after hospitalization: NPH 70/30 24 in am and 12 before dinner He was on Metformin >> AP. He tried [place] >> AP.  Pt checks his sugars 0-3x a day and they are: - am: 126, 258, 305 - 2h after b'fast: n/c - before lunch: n/c - 2h after lunch: 242, 262 - before dinner: 93-110 - 2h after dinner: 255, 276 - bedtime: n/c - nighttime: n/c Lowest sugar was 67; he has hypoglycemia awareness at 100.  Highest sugar was 300s.  Glucometer: Accu-Chek guide  Pt's meals are: - Breakfast: skips or protein shake + waffle/ pancakes - Lunch: skips or potatoes, meat  - Dinner: meat + potatoes - Snacks: rarely, popcorn  - no CKD, last BUN/creatinine:  Lab Results  Component Value Date   BUN 12 01/20/2022   BUN <5 (L) 01/13/2022   CREATININE 0.46 (L) 01/20/2022   CREATININE 0.40 (L) 01/13/2022  On Cozaar 50 mg daily.  -+  HL; last set of lipids: Lab Results  Component Value Date   CHOL 174 04/28/2021   HDL 70 04/28/2021   LDLCALC 89 04/28/2021   TRIG 78 04/28/2021   CHOLHDL 2.5 04/28/2021  He is on Lipitor 20 mg daily  - last eye exam was in 2021. No DR reportedly. + cataract.  - + numbness and tingling in his feet.  He also has a history of GERD, Barrett's esophagus, hepatitis C, depression  ROS: + see HPI He does have increased thirst and urination, and also continues to have abdominal pain.  Past Medical History:  Diagnosis Date   Barrett's esophagus dx 2016   Bronchitis    Chronic hepatitis C without hepatic coma ([place]) 10/31/2014   Depression    Diabetes [facility])    ED (erectile dysfunction)    GERD (gastroesophageal reflux disease)    Hepatitis C    Hiatal hernia 10/2014   3cm   Neuropathy    pancreatic ca 12/2020   S/P transmetatarsal amputation of foot, left ([place]) 11/28/2020   Past Surgical History:  Procedure Laterality Date   AMPUTATION Left 11/15/2020   Procedure: LEFT TRANSMETATARSALS AMPUTATION;  Surgeon: Newt Minion, MD;  Location: [facility];  Service: Orthopedics;  Laterality: Left;   APPENDECTOMY     BILIARY STENT PLACEMENT N/A 02/27/2021   Procedure: BILIARY STENT PLACEMENT;  Surgeon: Milus Banister, MD;  Location: [facility];  Service: Endoscopy;  Laterality: N/A;   BILIARY STENT PLACEMENT N/A 03/27/2021   Procedure: BILIARY STENT PLACEMENT;  Surgeon: Jackquline Denmark, MD;  Location: [facility];  Service: Endoscopy;  Laterality: N/A;   BIOPSY  03/27/2021   Procedure: BIOPSY;  Surgeon: Jackquline Denmark, MD;  Location: [facility];  Service: Endoscopy;;   COLONOSCOPY N/A 02/16/2014   Procedure: COLONOSCOPY;  Surgeon: Gatha Mayer, MD;  Location: [facility];  Service: Endoscopy;  Laterality: N/A;   COLONOSCOPY     ENDOSCOPIC RETROGRADE CHOLANGIOPANCREATOGRAPHY (ERCP) WITH PROPOFOL N/A 02/27/2021   Procedure: ENDOSCOPIC RETROGRADE CHOLANGIOPANCREATOGRAPHY (ERCP) WITH  PROPOFOL;  Surgeon: Milus Banister, MD;  [facility];  Service: Endoscopy;  Laterality: N/A;   ERCP N/A 03/27/2021   Procedure: ENDOSCOPIC RETROGRADE CHOLANGIOPANCREATOGRAPHY (ERCP);  Surgeon: Jackquline Denmark, MD;  Location: [facility];  Service: Endoscopy;  Laterality: N/A;   ESOPHAGOGASTRODUODENOSCOPY (EGD) WITH PROPOFOL N/A 05/06/2021   Procedure: ESOPHAGOGASTRODUODENOSCOPY (EGD) WITH PROPOFOL;  Surgeon: Mauri Pole, MD;  [facility];  Service: Endoscopy;  Laterality: N/A;   EUS N/A 02/27/2021   Procedure: UPPER ENDOSCOPIC ULTRASOUND (EUS) RADIAL;  Surgeon: Milus Banister, MD;  [facility];  Service: Endoscopy;  Laterality: N/A;   FINE NEEDLE ASPIRATION N/A 02/27/2021   Procedure: FINE NEEDLE ASPIRATION (FNA) LINEAR;  Surgeon: Milus Banister, MD;  [facility];  Service: Endoscopy;  Laterality: N/A;   IR IMAGING GUIDED PORT INSERTION  03/21/2021   SPHINCTEROTOMY  02/27/2021   Procedure: SPHINCTEROTOMY;  Surgeon: Milus Banister, MD;  [facility];  Service: Endoscopy;;   STENT REMOVAL  03/27/2021   Procedure: STENT REMOVAL;  Surgeon: Jackquline Denmark, MD;  [facility];  Service: Endoscopy;;   TONSILLECTOMY     Social History   Socioeconomic History   Marital status: Single    Spouse name: Not on file   Number of children: Not on file   Years of education: Not on file   Highest education level: Not on file  Occupational History   Not on file  Tobacco Use   Smoking status: Every Day    Packs/day: 0.50    Years: 35.00    Pack years: 17.50    Types: Cigarettes   Smokeless tobacco: Never   Tobacco comments:    Currently being followed by the smoking cessation program   Vaping Use   Vaping Use: Never used  Substance and Sexual Activity   Alcohol use: Not Currently    Comment: previous   Drug use: No   Sexual activity: Not on file  Other Topics Concern   Not on file  Social History Narrative   Not on file    Social Determinants of Health   Financial Resource Strain: High Risk   Difficulty of Paying Living Expenses: Very hard  Food Insecurity: Food Insecurity Present   Worried About Running Out of Food in the Last Year: Sometimes true   Ran Out of Food in the Last Year: Sometimes true  Transportation Needs: Unmet Transportation Needs   Lack of Transportation (Medical): Yes   Lack of Transportation (Non-Medical): Yes  Physical Activity: Not on file  Stress: Not on file  Social Connections: Not on file  Intimate Partner Violence: Not At Risk   Fear of Current or Ex-Partner: No   Emotionally Abused: No   Physically Abused: No   Sexually Abused: No   Current Outpatient Medications on File Prior to Visit  Medication  Sig Dispense Refill   Accu-Chek Softclix Lancets lancets Check blood glucose level by fingerstick 3-4 times per day. 100 each 3   acetaminophen (TYLENOL) 500 MG tablet Take 500 mg by mouth every 6 (six) hours as needed for moderate pain.     albuterol (VENTOLIN HFA) 108 (90 Base) MCG/ACT inhaler Inhale 2 puffs into the lungs every 6 (six) hours as needed for wheezing or shortness of breath. (Patient taking differently: Inhale 1 puff into the lungs every 6 (six) hours as needed for shortness of breath.) 18 g 0   aspirin 81 MG EC tablet Take 1 tablet by mouth daily. 100 tablet 0   atorvastatin (LIPITOR) 20 MG tablet Take 1 tablet (20 mg total) by mouth daily. 90 tablet 0   Blood Glucose Monitoring Suppl (ACCU-CHEK GUIDE) w/Device KIT Check blood glucose level by fingerstick 3-4 times per day. 1 kit 0   chlorhexidine (PERIDEX) 0.12 % solution Rinse between teeth using irrigation syringe (15 mL) 2-3 times a day or as needed for gum irritation. (Patient taking differently: Use as directed 5 mLs in the mouth or throat daily as needed (for gum irriation).) 473 mL 2   Continuous Blood Gluc Receiver (FREESTYLE LIBRE 2 READER) DEVI Use as instructed. Check blood glucose level by fingerstick 4  - 5 per day. 1 each 1   Continuous Blood Gluc Sensor (FREESTYLE LIBRE 14 DAY SENSOR) MISC Use as instructed. Check blood glucose level by fingerstick 4-5 per day. 1 each 6   Continuous Blood Gluc Sensor (FREESTYLE LIBRE 2 SENSOR) MISC Use as directed. 1 each 6   diphenoxylate-atropine (LOMOTIL) 2.5-0.025 MG tablet Take 1 tablet by mouth 4 (four) times daily as needed for diarrhea or loose stools. 60 tablet 1   DULoxetine (CYMBALTA) 60 MG capsule Take 1 capsule (60 mg total) by mouth daily. 90 capsule 0   EPINEPHrine 0.3 mg/0.3 mL IJ SOAJ injection Inject 0.3 mg into the muscle once as needed for anaphylaxis.     fluconazole (DIFLUCAN) 200 MG tablet Take 2 tablets (400 mg total) by mouth daily. 3 tablet 0   fluconazole (DIFLUCAN) 200 MG tablet Take 2 tablets by mouth daily. 4 tablet 0   folic acid (FOLVITE) 1 MG tablet Take 1 tablet (1 mg total) by mouth in the morning.     glucose blood (ACCU-CHEK GUIDE) test strip Check blood glucose level by fingerstick 3 - 4 times per day. 100 each 12   Insulin NPH, Human,, Isophane, (HUMULIN N KWIKPEN) 100 UNIT/ML Kiwkpen Inject 35 Units into the skin every morning. (Patient taking differently: Inject 36 Units into the skin daily. 24 in the morning, 12 units before dinner) 45 mL 3   Insulin Pen Needle (TRUEPLUS PEN NEEDLES) 32G X 4 MM MISC Use as instructed. Inject into the skin twice daily 200 each 6   Insulin Pen Needle 32G X 4 MM MISC Use as directed with humulin kwikpen. 100 each 3   Insulin Syringe-Needle U-100 (TRUEPLUS INSULIN SYRINGE) 31G X 5/16" 0.3 ML MISC Use to inject insulin once daily. 100 each 6   lidocaine-prilocaine (EMLA) cream Apply 1 application topically as directed as needed. 30 g 0   lipase/protease/amylase (CREON) 12000-38000 units CPEP capsule Take 24,462-86,381 Units by mouth See admin instructions. Take 2 capsules by mouth daily with meals and may take 1 capsule by mouth as needed with snakes.     mirtazapine (REMERON) 30 MG tablet Take  1 tablet (30 mg total) by mouth at bedtime. 90 tablet  1   Multiple Vitamins-Minerals (THEREMS-M) TABS Take 1 tablet by mouth in the morning. (Patient taking differently: Take 1 tablet by mouth daily.) 90 tablet 3   nicotine (NICODERM CQ - DOSED IN MG/24 HOURS) 14 mg/24hr patch Apply one patch daily x 12 weeks. 84 patch 1   ondansetron (ZOFRAN) 8 MG tablet Take 1 tablet (8 mg total) by mouth 2 (two) times daily as needed. Start on day 3 after chemotherapy. (Patient taking differently: Take 8 mg by mouth 2 (two) times daily as needed for nausea.) 30 tablet 1   oxyCODONE (OXY IR/ROXICODONE) 5 MG immediate release tablet Take 1 to 2 tablets by mouth every 6 hours as needed for severe pain. 120 tablet 0   oxyCODONE ER (XTAMPZA ER) 9 MG C12A Take 1 capsule by mouth every 12 hours. 60 capsule 0   pantoprazole (PROTONIX) 40 MG tablet Take 1 tablet (40 mg total) by mouth daily. 90 tablet 0   polyethylene glycol powder (GLYCOLAX/MIRALAX) 17 GM/SCOOP powder Take 17 g by mouth at bedtime. (Patient taking differently: Take 17 g by mouth daily as needed for mild constipation.) 238 g 0   prochlorperazine (COMPAZINE) 10 MG tablet Take 1 tablet (10 mg total) by mouth every 6 (six) hours as needed for nausea or vomiting. (Patient taking differently: Take 10 mg by mouth every 6 (six) hours as needed for vomiting or nausea.) 30 tablet 1   Syringe, Disposable, 3 ML MISC Use As Directed 100 each 4   varenicline (APO-VARENICLINE) 1 MG tablet Take 1 tablet by mouth once daily in the morning with food (Patient taking differently: Take 1 mg by mouth daily.) 84 tablet 1   No current facility-administered medications on file prior to visit.   Allergies  Allergen Reactions   Bee Venom Anaphylaxis   Lactose Intolerance (Gi) Other (See Comments)    GI upset   Family History  Problem Relation Age of Onset   Breast cancer Mother    Stroke Father    Alcohol abuse Father    Heart disease Maternal Grandfather    Pancreatic  cancer Paternal Grandmother    Diabetes Paternal Grandfather    Colon cancer Neg Hx    Stomach cancer Neg Hx    Rectal cancer Neg Hx    Esophageal cancer Neg Hx    PE: BP 98/62 (BP Location: Left Arm, Patient Position: Sitting, Cuff Size: Normal)   Pulse (!) 110   Ht '5\' 11"'  (1.803 m)   Wt 131 lb 6.4 oz (59.6 kg)   SpO2 97%   BMI 18.33 kg/m  Wt Readings from Last 3 Encounters:  01/21/22 131 lb 6.4 oz (59.6 kg)  01/20/22 129 lb 3.2 oz (58.6 kg)  01/13/22 128 lb (58.1 kg)   Constitutional: Cachectic, in NAD Eyes: PERRLA, EOMI, no exophthalmos ENT: moist mucous membranes, no thyromegaly, no cervical lymphadenopathy Cardiovascular: Tachycardia, RR, No MRG Respiratory: CTA B Musculoskeletal: no deformities, strength intact in all 4 Skin: moist, warm, no rashes Neurological: no tremor with outstretched hands, DTR normal in all 4  ASSESSMENT: 1. DM2, insulin-dependent, uncontrolled, with complications -Gastroparesis -Peripheral neuropathy -Left transmetatarsal amputation -ED  2. HL  PLAN:  1. Patient with long-standing, uncontrolled diabetes, on injectable antidiabetic regimen, with still poor control.  Latest HbA1c was above target, at 9.7%.  At today's visit, this is lower, at 9.1%, but still above target. -He has an unfortunate history of pancreatic cancer and status post Whipple procedure.  Approximately 50% of his pancreas was resected.  It is likely that he is insulin deficient for now based on the history and also based on his fluctuating blood sugars at home.  In the last few days, after his discharge, it appears that the sugars are high in the morning, in the 200s to 300s and they improve later in the day.  Upon questioning, before this admission, he was on NPH with 66% of the dose in the morning and 33% later in the day.  However, on discharge, he was changed to Novolin 70/30.  He takes 24 units in the morning and 12 units at night.  He is taking the dose at the start of  meals and we discussed that this is an insulin that is taken 30 minutes before meals. However, he describes that he skips meals due to his abdominal pain.  We discussed that in this case, the 70/30 insulin regimen is not a good fit for him as he needs a more flexible insulin plan. He would benefit from a peakless long-acting insulin and also mealtime insulin, which I advised him to skip if he does not eat.  At this visit I suggested [place] and Lyumjev.  I advised him to take [place] daily even if he is not eating.  We will start at a lower dose and increase if needed.  Regarding Lyumjev, he needs to take it before every meal, at the start of the meal and we discussed about how to vary the dose of insulin based on the size and consistency of the meal.  -At today's visit, he has a freestyle libre 2 CGM with him, which I applied on his arm. -We discussed about always checking with his glucometer if he is trying to collect a low blood sugar and we also discussed about the delay between blood sugar and interstitial sugar changes. - I suggested to:  Patient Instructions  Please use the following diabetic regimen: - Tresiba 20 units daily (may need to increase to 24 units if sugars are >140 in am in 4 days) - Lyumjev 5-8 units before the 3 meals  You can use the following Sliding scale for Lyumjev: - 150-175: + 1 unit  - 176-200: + 2 units  - 201-225: + 3 units  - 226-250: + 4 units  - 251-275: + 5 units - 276-300: + 6 units   Please return in 1.5-2 months.   - check sugars at different times of the day - check 4x a day, rotating checks - discussed about CBG targets for treatment: 80-130 mg/dL before meals and <180 mg/dL after meals; target HbA1c <7%. - given foot care handout  - given instructions for hypoglycemia management "15-15 rule"  - advised for yearly eye exams  - Return to clinic in 1.5-2 mo  2. HL - Reviewed latest lipid panel from 03/2021: LDL above our goal of less than 70, the  rest of the fractions at goal Lab Results  Component Value Date   CHOL 174 04/28/2021   HDL 70 04/28/2021   LDLCALC 89 04/28/2021   TRIG 78 04/28/2021   CHOLHDL 2.5 04/28/2021  - Continues Lipitor 10 mg daily without side effects.  - Total time spent for the visit: 40 minutes, in precharting, obtaining medical information from the patient and his chart, reviewing Dr. Cordelia Pen last note, reviewing his  previous labs, evaluations, and treatments, reviewing his symptoms, counseling him about his conditions (please see the discussed topics above), and developing a plan to further treat them; he had a number  of questions which I addressed.  I also demonstrated how to attach his CGM and how to link his CGM to the clinic.  Philemon Kingdom, MD PhD [facility] Endocrinology

## 2022-01-21 NOTE — Patient Instructions (Addendum)
Please use the following diabetic regimen: - Tresiba 20 units daily (may need to increase to 24 units if sugars are >140 in am in 4 days) - Lyumjev 5-8 units before the 3 meals  You can use the following Sliding scale for Lyumjev: - 150-175: + 1 unit  - 176-200: + 2 units  - 201-225: + 3 units  - 226-250: + 4 units  - 251-275: + 5 units - 276-300: + 6 units   Please return in 1.5-2 months.   PATIENT INSTRUCTIONS FOR TYPE 2 DIABETES:  DIET AND EXERCISE Diet and exercise is an important part of diabetic treatment.  We recommended aerobic exercise in the form of brisk walking (working between 40-60% of maximal aerobic capacity, similar to brisk walking) for 150 minutes per week (such as 30 minutes five days per week) along with 3 times per week performing 'resistance' training (using various gauge rubber tubes with handles) 5-10 exercises involving the major muscle groups (upper body, lower body and core) performing 10-15 repetitions (or near fatigue) each exercise. Start at half the above goal but build slowly to reach the above goals. If limited by weight, joint pain, or disability, we recommend daily walking in a swimming pool with water up to waist to reduce pressure from joints while allow for adequate exercise.    BLOOD GLUCOSES Monitoring your blood glucoses is important for continued management of your diabetes. Please check your blood glucoses 2-4 times a day: fasting, before meals and at bedtime (you can rotate these measurements - e.g. one day check before the 3 meals, the next day check before 2 of the meals and before bedtime, etc.).   HYPOGLYCEMIA (low blood sugar) Hypoglycemia is usually a reaction to not eating, exercising, or taking too much insulin/ other diabetes drugs.  Symptoms include tremors, sweating, hunger, confusion, headache, etc. Treat IMMEDIATELY with 15 grams of Carbs: 4 glucose tablets  cup regular juice/soda 2 tablespoons raisins 4 teaspoons sugar 1  tablespoon honey Recheck blood glucose in 15 mins and repeat above if still symptomatic/blood glucose <100.  RECOMMENDATIONS TO REDUCE YOUR RISK OF DIABETIC COMPLICATIONS: * Take your prescribed MEDICATION(S) * Follow a DIABETIC diet: Complex carbs, fiber rich foods, (monounsaturated and polyunsaturated) fats * AVOID saturated/trans fats, high fat foods, >2,300 mg salt per day. * EXERCISE at least 5 times a week for 30 minutes or preferably daily.  * DO NOT SMOKE OR DRINK more than 1 drink a day. * Check your FEET every day. Do not wear tightfitting shoes. Contact us if you develop an ulcer * See your EYE doctor once a year or more if needed * Get a FLU shot once a year * Get a PNEUMONIA vaccine once before and once after age 81 years  GOALS:  * Your Hemoglobin A1c of <7%  * fasting sugars need to be <130 * after meals sugars need to be <180 (2h after you start eating) * Your Systolic BP should be 494 or lower  * Your Diastolic BP should be 80 or lower  * Your HDL (Good Cholesterol) should be 40 or higher  * Your LDL (Bad Cholesterol) should be 100 or lower. * Your Triglycerides should be 150 or lower  * Your Urine microalbumin (kidney function) should be <30 * Your Body Mass Index should be 25 or lower

## 2022-01-22 ENCOUNTER — Encounter: Payer: Self-pay | Admitting: Hematology and Oncology

## 2022-01-22 ENCOUNTER — Other Ambulatory Visit (HOSPITAL_COMMUNITY): Payer: Self-pay

## 2022-01-23 ENCOUNTER — Inpatient Hospital Stay: Payer: Medicaid Other

## 2022-01-23 ENCOUNTER — Telehealth: Payer: Self-pay | Admitting: *Deleted

## 2022-01-23 ENCOUNTER — Other Ambulatory Visit: Payer: Self-pay | Admitting: Hematology and Oncology

## 2022-01-23 DIAGNOSIS — Z452 Encounter for adjustment and management of vascular access device: Secondary | ICD-10-CM | POA: Diagnosis not present

## 2022-01-23 DIAGNOSIS — C250 Malignant neoplasm of head of pancreas: Secondary | ICD-10-CM

## 2022-01-23 DIAGNOSIS — Z95828 Presence of other vascular implants and grafts: Secondary | ICD-10-CM

## 2022-01-23 LAB — CBC WITH DIFFERENTIAL (CANCER CENTER ONLY)
Abs Immature Granulocytes: 0.03 10*3/uL (ref 0.00–0.07)
Basophils Absolute: 0.1 10*3/uL (ref 0.0–0.1)
Basophils Relative: 1 %
Eosinophils Absolute: 0.1 10*3/uL (ref 0.0–0.5)
Eosinophils Relative: 1 %
HCT: 29.4 % — ABNORMAL LOW (ref 39.0–52.0)
Hemoglobin: 9.8 g/dL — ABNORMAL LOW (ref 13.0–17.0)
Immature Granulocytes: 0 %
Lymphocytes Relative: 16 %
Lymphs Abs: 1.9 10*3/uL (ref 0.7–4.0)
MCH: 27.6 pg (ref 26.0–34.0)
MCHC: 33.3 g/dL (ref 30.0–36.0)
MCV: 82.8 fL (ref 80.0–100.0)
Monocytes Absolute: 1.5 10*3/uL — ABNORMAL HIGH (ref 0.1–1.0)
Monocytes Relative: 13 %
Neutro Abs: 8.1 10*3/uL — ABNORMAL HIGH (ref 1.7–7.7)
Neutrophils Relative %: 69 %
Platelet Count: 421 10*3/uL — ABNORMAL HIGH (ref 150–400)
RBC: 3.55 MIL/uL — ABNORMAL LOW (ref 4.22–5.81)
RDW: 16 % — ABNORMAL HIGH (ref 11.5–15.5)
WBC Count: 11.7 10*3/uL — ABNORMAL HIGH (ref 4.0–10.5)
nRBC: 0 % (ref 0.0–0.2)

## 2022-01-23 LAB — CMP (CANCER CENTER ONLY)
ALT: 11 U/L (ref 0–44)
AST: 19 U/L (ref 15–41)
Albumin: 3 g/dL — ABNORMAL LOW (ref 3.5–5.0)
Alkaline Phosphatase: 239 U/L — ABNORMAL HIGH (ref 38–126)
Anion gap: 3 — ABNORMAL LOW (ref 5–15)
BUN: 9 mg/dL (ref 6–20)
CO2: 30 mmol/L (ref 22–32)
Calcium: 8.7 mg/dL — ABNORMAL LOW (ref 8.9–10.3)
Chloride: 98 mmol/L (ref 98–111)
Creatinine: 0.33 mg/dL — ABNORMAL LOW (ref 0.61–1.24)
GFR, Estimated: >60 mL/min (ref >60)
Glucose, Bld: 121 mg/dL — ABNORMAL HIGH (ref 70–99)
Potassium: 3.8 mmol/L (ref 3.5–5.1)
Sodium: 131 mmol/L — ABNORMAL LOW (ref 135–145)
Total Bilirubin: 0.4 mg/dL (ref 0.3–1.2)
Total Protein: 7.2 g/dL (ref 6.5–8.1)

## 2022-01-23 MED ORDER — SODIUM CHLORIDE 0.9% FLUSH
10.0000 mL | Freq: Once | INTRAVENOUS | Status: AC
  Administered 2022-01-23: 10 mL

## 2022-01-23 MED ORDER — HEPARIN SOD (PORK) LOCK FLUSH 100 UNIT/ML IV SOLN
500.0000 [IU] | Freq: Once | INTRAVENOUS | Status: AC
  Administered 2022-01-23: 500 [IU]

## 2022-01-23 NOTE — Telephone Encounter (Signed)
-----   Message from Orson Slick, MD sent at 01/23/2022 12:50 PM EDT ----- Please let Mr. Dettinger know that his WBC has trended down 11.7. He should be seeing Dr. Francina Ames next week (with a phone visit planned for today).   ----- Message ----- From: Buel Ream, Lab In [place] Sent: 01/23/2022  10:37 AM EDT To: Orson Slick, MD

## 2022-01-23 NOTE — Telephone Encounter (Signed)
TCT patient regarding lab results.  Spoke with him. Advised that his WBC has come down to 11.7. down from 23K   Pt very pleased about that. He states he feels fairly well. Some chills but no fever. He states he is eating a bit better. No wound drainage.  He sees surgeon @ [place] on 02/02/22 and will need his appt here (also scheduled on that day) moved to another day.   Scheduling message sent. [name] knows to call  with any questions or concerns.

## 2022-01-24 ENCOUNTER — Encounter: Payer: Self-pay | Admitting: Internal Medicine

## 2022-01-24 ENCOUNTER — Encounter: Payer: Self-pay | Admitting: Hematology and Oncology

## 2022-01-27 ENCOUNTER — Other Ambulatory Visit: Payer: Self-pay | Admitting: Physician Assistant

## 2022-01-27 ENCOUNTER — Encounter (HOSPITAL_BASED_OUTPATIENT_CLINIC_OR_DEPARTMENT_OTHER): Payer: Medicaid Other | Admitting: General Surgery

## 2022-01-27 DIAGNOSIS — C250 Malignant neoplasm of head of pancreas: Secondary | ICD-10-CM

## 2022-01-27 DIAGNOSIS — E11621 Type 2 diabetes mellitus with foot ulcer: Secondary | ICD-10-CM | POA: Diagnosis not present

## 2022-01-28 ENCOUNTER — Other Ambulatory Visit: Payer: Self-pay

## 2022-01-28 ENCOUNTER — Other Ambulatory Visit (HOSPITAL_COMMUNITY): Payer: Self-pay

## 2022-01-28 ENCOUNTER — Encounter: Payer: Self-pay | Admitting: Hematology and Oncology

## 2022-01-28 MED ORDER — LIDOCAINE-PRILOCAINE 2.5-2.5 % EX CREA
1.0000 "application " | TOPICAL_CREAM | CUTANEOUS | 0 refills | Status: DC | PRN
  Filled 2022-01-28: qty 30, 30d supply, fill #0

## 2022-01-29 ENCOUNTER — Telehealth: Payer: Self-pay

## 2022-01-29 ENCOUNTER — Other Ambulatory Visit (HOSPITAL_COMMUNITY): Payer: Self-pay

## 2022-01-29 ENCOUNTER — Other Ambulatory Visit: Payer: Self-pay

## 2022-01-29 NOTE — Telephone Encounter (Signed)
Patient Advocate Encounter   Received notification that prior authorization for Tresiba FlexTouch (insulin degludec injection) 100 Units/mL solution  is required.   PA submitted on 01/29/2022 via [employer] Prior Authorization  Prior Authorization Request [id number] Status is pending

## 2022-01-29 NOTE — Telephone Encounter (Signed)
Patient Advocate Encounter  Prior Authorization for [name] [name] FlexTouch (insulin degludec injection) 100 Units/mL solution has been approved.    Effective dates: 01/29/2022 through 01/30/2023  Patients co-pay is $4.

## 2022-01-30 ENCOUNTER — Other Ambulatory Visit (HOSPITAL_COMMUNITY): Payer: Self-pay

## 2022-01-30 ENCOUNTER — Encounter: Payer: Self-pay | Admitting: Hematology and Oncology

## 2022-01-30 ENCOUNTER — Other Ambulatory Visit: Payer: Self-pay | Admitting: Family Medicine

## 2022-01-30 ENCOUNTER — Other Ambulatory Visit: Payer: Self-pay

## 2022-01-30 ENCOUNTER — Other Ambulatory Visit: Payer: Self-pay | Admitting: Hematology and Oncology

## 2022-01-30 DIAGNOSIS — F10982 Alcohol use, unspecified with alcohol-induced sleep disorder: Secondary | ICD-10-CM

## 2022-01-30 DIAGNOSIS — F17200 Nicotine dependence, unspecified, uncomplicated: Secondary | ICD-10-CM

## 2022-01-30 MED ORDER — VALACYCLOVIR HCL 1 G PO TABS
ORAL_TABLET | ORAL | 0 refills | Status: DC
  Filled 2022-01-30 (×2): qty 21, 7d supply, fill #0

## 2022-01-30 MED ORDER — OXYCODONE HCL 5 MG PO TABS
5.0000 mg | ORAL_TABLET | Freq: Four times a day (QID) | ORAL | 0 refills | Status: DC | PRN
  Filled 2022-01-30: qty 120, 15d supply, fill #0

## 2022-01-30 MED ORDER — MIRTAZAPINE 30 MG PO TABS
30.0000 mg | ORAL_TABLET | Freq: Every day | ORAL | 0 refills | Status: DC
  Filled 2022-01-30: qty 90, 90d supply, fill #0

## 2022-01-30 MED ORDER — ALBUTEROL SULFATE HFA 108 (90 BASE) MCG/ACT IN AERS
INHALATION_SPRAY | RESPIRATORY_TRACT | 0 refills | Status: DC
  Filled 2022-01-30: qty 18, 25d supply, fill #0

## 2022-01-30 NOTE — Telephone Encounter (Signed)
Requested Prescriptions  Pending Prescriptions Disp Refills  . mirtazapine (REMERON) 30 MG tablet 90 tablet 0    Sig: Take 1 tablet (30 mg total) by mouth at bedtime.     Psychiatry: Antidepressants - mirtazapine Passed - 01/30/2022  9:00 AM      Passed - Completed PHQ-2 or PHQ-9 in the last 360 days      Passed - Valid encounter within last 6 months    Recent Outpatient Visits          1 month ago Essential hypertension   [place], Vernia Buff, NP   3 months ago Loose stools   [place], Vernia Buff, NP   6 months ago GERD without esophagitis   [place], Vernia Buff, NP   7 months ago Dislocation of finger, subsequent encounter   [name] [name], NP   9 months ago Hospital discharge follow-up   [place], Zelda W, NP      Future Appointments            In 5 days [name], NP [place]   In 6 days [place], [place], [place], [place]   In 1 month [place], Vernia Buff, NP [place]

## 2022-02-02 ENCOUNTER — Other Ambulatory Visit: Payer: Self-pay

## 2022-02-02 ENCOUNTER — Encounter (HOSPITAL_BASED_OUTPATIENT_CLINIC_OR_DEPARTMENT_OTHER): Payer: Medicaid Other | Attending: General Surgery | Admitting: General Surgery

## 2022-02-02 ENCOUNTER — Other Ambulatory Visit: Payer: Self-pay | Admitting: Hematology and Oncology

## 2022-02-02 ENCOUNTER — Inpatient Hospital Stay: Payer: Medicaid Other | Attending: Hematology and Oncology

## 2022-02-02 ENCOUNTER — Other Ambulatory Visit (HOSPITAL_COMMUNITY): Payer: Self-pay

## 2022-02-02 ENCOUNTER — Inpatient Hospital Stay (HOSPITAL_BASED_OUTPATIENT_CLINIC_OR_DEPARTMENT_OTHER): Payer: Medicaid Other | Admitting: Hematology and Oncology

## 2022-02-02 VITALS — BP 136/86 | HR 92 | Temp 98.3°F | Resp 15 | Ht 71.0 in | Wt 125.9 lb

## 2022-02-02 DIAGNOSIS — E11621 Type 2 diabetes mellitus with foot ulcer: Secondary | ICD-10-CM | POA: Diagnosis present

## 2022-02-02 DIAGNOSIS — K3184 Gastroparesis: Secondary | ICD-10-CM | POA: Insufficient documentation

## 2022-02-02 DIAGNOSIS — T8149XA Infection following a procedure, other surgical site, initial encounter: Secondary | ICD-10-CM | POA: Diagnosis not present

## 2022-02-02 DIAGNOSIS — Z8507 Personal history of malignant neoplasm of pancreas: Secondary | ICD-10-CM | POA: Diagnosis not present

## 2022-02-02 DIAGNOSIS — Z79899 Other long term (current) drug therapy: Secondary | ICD-10-CM | POA: Diagnosis not present

## 2022-02-02 DIAGNOSIS — Z452 Encounter for adjustment and management of vascular access device: Secondary | ICD-10-CM | POA: Diagnosis present

## 2022-02-02 DIAGNOSIS — E1142 Type 2 diabetes mellitus with diabetic polyneuropathy: Secondary | ICD-10-CM | POA: Diagnosis not present

## 2022-02-02 DIAGNOSIS — K219 Gastro-esophageal reflux disease without esophagitis: Secondary | ICD-10-CM | POA: Diagnosis not present

## 2022-02-02 DIAGNOSIS — Z7982 Long term (current) use of aspirin: Secondary | ICD-10-CM | POA: Diagnosis not present

## 2022-02-02 DIAGNOSIS — D6959 Other secondary thrombocytopenia: Secondary | ICD-10-CM | POA: Diagnosis not present

## 2022-02-02 DIAGNOSIS — B182 Chronic viral hepatitis C: Secondary | ICD-10-CM | POA: Diagnosis not present

## 2022-02-02 DIAGNOSIS — E1165 Type 2 diabetes mellitus with hyperglycemia: Secondary | ICD-10-CM | POA: Insufficient documentation

## 2022-02-02 DIAGNOSIS — C250 Malignant neoplasm of head of pancreas: Secondary | ICD-10-CM

## 2022-02-02 DIAGNOSIS — F17210 Nicotine dependence, cigarettes, uncomplicated: Secondary | ICD-10-CM | POA: Diagnosis not present

## 2022-02-02 DIAGNOSIS — Z8619 Personal history of other infectious and parasitic diseases: Secondary | ICD-10-CM | POA: Diagnosis not present

## 2022-02-02 DIAGNOSIS — Z794 Long term (current) use of insulin: Secondary | ICD-10-CM | POA: Insufficient documentation

## 2022-02-02 DIAGNOSIS — L97528 Non-pressure chronic ulcer of other part of left foot with other specified severity: Secondary | ICD-10-CM | POA: Insufficient documentation

## 2022-02-02 DIAGNOSIS — K2270 Barrett's esophagus without dysplasia: Secondary | ICD-10-CM | POA: Diagnosis not present

## 2022-02-02 DIAGNOSIS — F32A Depression, unspecified: Secondary | ICD-10-CM | POA: Insufficient documentation

## 2022-02-02 DIAGNOSIS — T451X5A Adverse effect of antineoplastic and immunosuppressive drugs, initial encounter: Secondary | ICD-10-CM | POA: Diagnosis not present

## 2022-02-02 DIAGNOSIS — Z89432 Acquired absence of left foot: Secondary | ICD-10-CM | POA: Diagnosis not present

## 2022-02-02 DIAGNOSIS — Z95828 Presence of other vascular implants and grafts: Secondary | ICD-10-CM | POA: Diagnosis not present

## 2022-02-02 DIAGNOSIS — D6481 Anemia due to antineoplastic chemotherapy: Secondary | ICD-10-CM | POA: Insufficient documentation

## 2022-02-02 LAB — CMP (CANCER CENTER ONLY)
ALT: 11 U/L (ref 0–44)
AST: 27 U/L (ref 15–41)
Albumin: 3.2 g/dL — ABNORMAL LOW (ref 3.5–5.0)
Alkaline Phosphatase: 184 U/L — ABNORMAL HIGH (ref 38–126)
Anion gap: 8 (ref 5–15)
BUN: 8 mg/dL (ref 6–20)
CO2: 27 mmol/L (ref 22–32)
Calcium: 9 mg/dL (ref 8.9–10.3)
Chloride: 105 mmol/L (ref 98–111)
Creatinine: 0.43 mg/dL — ABNORMAL LOW (ref 0.61–1.24)
GFR, Estimated: >60 mL/min (ref >60)
Glucose, Bld: 118 mg/dL — ABNORMAL HIGH (ref 70–99)
Potassium: 3.6 mmol/L (ref 3.5–5.1)
Sodium: 140 mmol/L (ref 135–145)
Total Bilirubin: 0.3 mg/dL (ref 0.3–1.2)
Total Protein: 7.3 g/dL (ref 6.5–8.1)

## 2022-02-02 LAB — CBC WITH DIFFERENTIAL (CANCER CENTER ONLY)
Abs Immature Granulocytes: 0.07 10*3/uL (ref 0.00–0.07)
Basophils Absolute: 0.1 10*3/uL (ref 0.0–0.1)
Basophils Relative: 1 %
Eosinophils Absolute: 0.3 10*3/uL (ref 0.0–0.5)
Eosinophils Relative: 3 %
HCT: 34.5 % — ABNORMAL LOW (ref 39.0–52.0)
Hemoglobin: 11.2 g/dL — ABNORMAL LOW (ref 13.0–17.0)
Immature Granulocytes: 1 %
Lymphocytes Relative: 23 %
Lymphs Abs: 2.5 10*3/uL (ref 0.7–4.0)
MCH: 26.9 pg (ref 26.0–34.0)
MCHC: 32.5 g/dL (ref 30.0–36.0)
MCV: 82.9 fL (ref 80.0–100.0)
Monocytes Absolute: 1 10*3/uL (ref 0.1–1.0)
Monocytes Relative: 10 %
Neutro Abs: 7 10*3/uL (ref 1.7–7.7)
Neutrophils Relative %: 62 %
Platelet Count: 408 10*3/uL — ABNORMAL HIGH (ref 150–400)
RBC: 4.16 MIL/uL — ABNORMAL LOW (ref 4.22–5.81)
RDW: 16.2 % — ABNORMAL HIGH (ref 11.5–15.5)
WBC Count: 11 10*3/uL — ABNORMAL HIGH (ref 4.0–10.5)
nRBC: 0 % (ref 0.0–0.2)

## 2022-02-02 MED ORDER — HEPARIN SOD (PORK) LOCK FLUSH 100 UNIT/ML IV SOLN
500.0000 [IU] | Freq: Once | INTRAVENOUS | Status: AC
  Administered 2022-02-02: 500 [IU]

## 2022-02-02 MED ORDER — SODIUM CHLORIDE 0.9% FLUSH
10.0000 mL | Freq: Once | INTRAVENOUS | Status: AC
  Administered 2022-02-02: 10 mL
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
## 2022-02-02 NOTE — Progress Notes (Signed)
[place] [phone]   [phone]  PROGRESS NOTE  Patient Care Team: [name], NP as PCP - General (Nurse Practitioner) Havery Moros, Carlota Raspberry, MD as Consulting Physician (Gastroenterology) Orson Slick, MD as Consulting Physician (Oncology) Royston Bake, RN as Registered Nurse  Hematological/Oncological History # Adenocarcinoma of the Pancreas, Stage IIB 02/26/2021: patient seen by GI for jaundice and pancreatic mass causing CBD obstruction. CT abdomen showed pancreatic head mass (4.4 x 3.7 cm) and enlarged portocaval lymph nodes. 02/27/2021: ERCP with placement of a nonmetallic biliary stent. FNA of pancreatic head showed malignant cells consistent with pancreatic adenocarcinoma. 03/05/2021: CT Chest showed no evidence of metastatic disease in the chest. 03/12/2021: establish care with Dr. Lorenso Courier 04/10/2021: Cycle 1 Day 1 of FOLFIRINOX 04/24/2021: Cycle 2 Day 1 of FOLFIRINOX 05/22/2021: Cycle 3 Day 1 of FOLFIRINOX. Delayed x 2 weeks due to hospitalization for poor po intake and dehydration due to gastroparesis. 06/05/2021: Cycle 4 Day 1 of FOLFIRINOX 06/19/2021: Cycle 5 Day 1 of FOLFIRINOX 07/02/2021: Cycle 6 Day 1 of FOLFIRINOX 07/16/2021: Cycle 7 Day 1 of FOLFIRINOX  07/30/2021: Cycle 8 Day 1 of FOLFIRINOX  08/13/2021: Cycle 9 Day 1 of FOLFIRINOX  08/27/2021:  Cycle 10 Day 1 of FOLFIRINOX  09/10/2021: Cycle 11 Day 1 of FOLFIRINOX  09/24/2021: Cycle 12 Day 1 of FOLFIRINOX  10/28/2021: Underwent open whipple with portal vein reconstruction utilizing a R deep FV graft at [facility]. Positive margins noted.   Interval History:  [name] [name] 61 y.o. male returns for a follow up visit for pancreatic cancer. He was last seen on 01/20/2022. In the interim since his last visit he has been re-evaluated by [facility] with strong improvement in his clinical status.   On exam today [name] [name] is accompanied by his girlfriend.  He reports that he did  develop shingles though fortunately he is not in any pain.  This was diagnosed by surgery last Friday and they have started him up on acyclovir.  Unfortunately has been losing weight but his appetite is improving.  He is doing his best to try to put on more pounds.  He is currently down 6 pounds from his last visit.  He notes that his foot wound is healing relatively well.  He notes that his pain is currently about a 4 out of 10 and that overall his pain medications are controlling his pain well.  He has been using his long-acting Xtampza as well as approximately 6 to 8 pills of the oxycodone IR.  He denies any diarrhea, constipation, sweats, headache, or vision changes.  A full 10 point ROS is listed below.  Today we discussed treatment options moving forward.  We discussed the details regarding chemoradiation therapy for his positive margins with pancreatic cancer.  He voices understanding and noted he was willing and able to proceed with treatment as soon as is feasible.  MEDICAL HISTORY:  Past Medical History:  Diagnosis Date   Barrett's esophagus dx 2016   Bronchitis    Chronic hepatitis C without hepatic coma ([place]) 10/31/2014   Depression    Diabetes ([place])    ED (erectile dysfunction)    GERD (gastroesophageal reflux disease)    Hepatitis C    Hiatal hernia 10/2014   3cm   Neuropathy    pancreatic ca 12/2020   S/P transmetatarsal amputation of foot, left ([place]) 11/28/2020    SURGICAL HISTORY: Past Surgical History:  Procedure Laterality Date   AMPUTATION Left 11/15/2020  Procedure: LEFT TRANSMETATARSALS AMPUTATION;  Surgeon: Newt Minion, MD;  Location: [place];  Service: Orthopedics;  Laterality: Left;   APPENDECTOMY     BILIARY STENT PLACEMENT N/A 02/27/2021   Procedure: BILIARY STENT PLACEMENT;  Surgeon: Milus Banister, MD;  Location: [facility];  Service: Endoscopy;  Laterality: N/A;   BILIARY STENT PLACEMENT N/A 03/27/2021   Procedure: BILIARY STENT PLACEMENT;  Surgeon:  Jackquline Denmark, MD;  Location: [facility];  Service: Endoscopy;  Laterality: N/A;   BIOPSY  03/27/2021   Procedure: BIOPSY;  Surgeon: Jackquline Denmark, MD;  Location: [facility];  Service: Endoscopy;;   COLONOSCOPY N/A 02/16/2014   Procedure: COLONOSCOPY;  Surgeon: Gatha Mayer, MD;  Location: [facility];  Service: Endoscopy;  Laterality: N/A;   COLONOSCOPY     ENDOSCOPIC RETROGRADE CHOLANGIOPANCREATOGRAPHY (ERCP) WITH PROPOFOL N/A 02/27/2021   Procedure: ENDOSCOPIC RETROGRADE CHOLANGIOPANCREATOGRAPHY (ERCP) WITH PROPOFOL;  Surgeon: Milus Banister, MD;  [facility];  Service: Endoscopy;  Laterality: N/A;   ERCP N/A 03/27/2021   Procedure: ENDOSCOPIC RETROGRADE CHOLANGIOPANCREATOGRAPHY (ERCP);  Surgeon: Jackquline Denmark, MD;  Location: [facility];  Service: Endoscopy;  Laterality: N/A;   ESOPHAGOGASTRODUODENOSCOPY (EGD) WITH PROPOFOL N/A 05/06/2021   Procedure: ESOPHAGOGASTRODUODENOSCOPY (EGD) WITH PROPOFOL;  Surgeon: Mauri Pole, MD;  [facility];  Service: Endoscopy;  Laterality: N/A;   EUS N/A 02/27/2021   Procedure: UPPER ENDOSCOPIC ULTRASOUND (EUS) RADIAL;  Surgeon: Milus Banister, MD;  [facility];  Service: Endoscopy;  Laterality: N/A;   FINE NEEDLE ASPIRATION N/A 02/27/2021   Procedure: FINE NEEDLE ASPIRATION (FNA) LINEAR;  Surgeon: Milus Banister, MD;  [facility];  Service: Endoscopy;  Laterality: N/A;   IR IMAGING GUIDED PORT INSERTION  03/21/2021   SPHINCTEROTOMY  02/27/2021   Procedure: SPHINCTEROTOMY;  Surgeon: Milus Banister, MD;  [facility];  Service: Endoscopy;;   STENT REMOVAL  03/27/2021   Procedure: STENT REMOVAL;  Surgeon: Jackquline Denmark, MD;  [facility];  Service: Endoscopy;;   TONSILLECTOMY      SOCIAL HISTORY: Social History   Socioeconomic History   Marital status: Single    Spouse name: Not on file   Number of children: Not on file   Years of education: Not on file   Highest education  level: Not on file  Occupational History   Not on file  Tobacco Use   Smoking status: Every Day    Packs/day: 0.50    Years: 35.00    Pack years: 17.50    Types: Cigarettes   Smokeless tobacco: Never   Tobacco comments:    Currently being followed by the smoking cessation program   Vaping Use   Vaping Use: Never used  Substance and Sexual Activity   Alcohol use: Not Currently    Comment: previous   Drug use: No   Sexual activity: Not on file  Other Topics Concern   Not on file  Social History Narrative   Not on file   Social Determinants of Health   Financial Resource Strain: High Risk   Difficulty of Paying Living Expenses: Very hard  Food Insecurity: Food Insecurity Present   Worried About Running Out of Food in the Last Year: Sometimes true   Ran Out of Food in the Last Year: Sometimes true  Transportation Needs: Unmet Transportation Needs   Lack of Transportation (Medical): Yes   Lack of Transportation (Non-Medical): Yes  Physical Activity: Not on file  Stress: Not on file  Social Connections: Not on file  Intimate Partner Violence: Not At Risk   Fear of Current or Ex-Partner: No   Emotionally Abused: No   Physically Abused: No   Sexually Abused: No    FAMILY HISTORY: Family History  Problem Relation Age of Onset   Breast cancer Mother    Stroke Father    Alcohol abuse Father    Heart disease Maternal Grandfather    Pancreatic cancer Paternal Grandmother    Diabetes Paternal Grandfather    Colon cancer Neg Hx    Stomach cancer Neg Hx    Rectal cancer Neg Hx    Esophageal cancer Neg Hx     ALLERGIES:  is allergic to bee venom and lactose intolerance (gi).  MEDICATIONS:  Current Outpatient Medications  Medication Sig Dispense Refill   capecitabine (XELODA) 500 MG tablet Take 3 tablets (1,500 mg total) by mouth 2 (two) times daily after a meal. 180 tablet 0   Accu-Chek Softclix Lancets lancets Check blood glucose level by fingerstick 3-4 times per  day. 100 each 3   acetaminophen (TYLENOL) 500 MG tablet Take 500 mg by mouth every 6 (six) hours as needed for moderate pain.     albuterol (VENTOLIN HFA) 108 (90 Base) MCG/ACT inhaler Inhale 2 puffs into the lungs every 6 (six) hours as needed for wheezing or shortness of breath. 18 g 0   aspirin 81 MG EC tablet Take 1 tablet by mouth daily. 100 tablet 0   atorvastatin (LIPITOR) 20 MG tablet Take 1 tablet (20 mg total) by mouth daily. 90 tablet 0   Blood Glucose Monitoring Suppl (ACCU-CHEK GUIDE) w/Device KIT Check blood glucose level by fingerstick 3-4 times per day. 1 kit 0   chlorhexidine (PERIDEX) 0.12 % solution Rinse between teeth using irrigation syringe (15 mL) 2-3 times a day or as needed for gum irritation. (Patient taking differently: Use as directed 5 mLs in the mouth or throat daily as needed (for gum irriation).) 473 mL 2   Continuous Blood Gluc Receiver (FREESTYLE LIBRE 2 READER) DEVI Use as instructed. Check blood glucose level by fingerstick 4 - 5 per day. 1 each 1   Continuous Blood Gluc Sensor (FREESTYLE LIBRE 2 SENSOR) MISC Use as directed. 1 each 6   diphenoxylate-atropine (LOMOTIL) 2.5-0.025 MG tablet Take 1 tablet by mouth 4 (four) times daily as needed for diarrhea or loose stools. 60 tablet 1   DULoxetine (CYMBALTA) 60 MG capsule Take 1 capsule (60 mg total) by mouth daily. 90 capsule 0   EPINEPHrine 0.3 mg/0.3 mL IJ SOAJ injection Inject 0.3 mg into the muscle once as needed for anaphylaxis.     folic acid (FOLVITE) 1 MG tablet Take 1 tablet (1 mg total) by mouth in the morning.     glucose blood (ACCU-CHEK GUIDE) test strip Check blood glucose level by fingerstick 3 - 4 times per day. 100 each 12   insulin degludec (TRESIBA FLEXTOUCH) 100 UNIT/ML FlexTouch Pen Inject 24 Units into the skin daily. 9 mL 3   Insulin Lispro-aabc (LYUMJEV KWIKPEN) 100 UNIT/ML KwikPen Inject 5-8 Units into the skin 3 (three) times daily before meals. 15 mL 3   Insulin Pen Needle (TRUEPLUS PEN  NEEDLES) 32G X 4 MM MISC Use as instructed. Inject into the skin twice daily 200 each 6   Insulin Pen Needle 32G X 4 MM MISC Use as directed with humulin kwikpen. 100 each 3   lidocaine-prilocaine (EMLA) cream Apply 1 application topically as directed as needed. 30 g 0   lipase/protease/amylase (  CREON) 12000-38000 units CPEP capsule Take 36,000-72,000 Units by mouth See admin instructions. Take 2 capsules by mouth daily with meals and may take 1 capsule by mouth as needed with snakes.     mirtazapine (REMERON) 30 MG tablet Take 1 tablet (30 mg total) by mouth at bedtime. 90 tablet 0   Multiple Vitamins-Minerals (THEREMS-M) TABS Take 1 tablet by mouth in the morning. (Patient taking differently: Take 1 tablet by mouth daily.) 90 tablet 3   nicotine (NICODERM CQ - DOSED IN MG/24 HOURS) 14 mg/24hr patch Apply one patch daily x 12 weeks. 84 patch 1   ondansetron (ZOFRAN) 8 MG tablet Take 1 tablet (8 mg total) by mouth 2 (two) times daily as needed. Start on day 3 after chemotherapy. (Patient taking differently: Take 8 mg by mouth 2 (two) times daily as needed for nausea.) 30 tablet 1   oxyCODONE (OXY IR/ROXICODONE) 5 MG immediate release tablet Take 1 to 2 tablets by mouth every 6 hours as needed for severe pain. 120 tablet 0   oxyCODONE ER (XTAMPZA ER) 9 MG C12A Take 1 capsule by mouth every 12 hours. 60 capsule 0   pantoprazole (PROTONIX) 40 MG tablet Take 1 tablet (40 mg total) by mouth daily. 90 tablet 0   polyethylene glycol powder (GLYCOLAX/MIRALAX) 17 GM/SCOOP powder Take 17 g by mouth at bedtime. (Patient taking differently: Take 17 g by mouth daily as needed for mild constipation.) 238 g 0   prochlorperazine (COMPAZINE) 10 MG tablet Take 1 tablet (10 mg total) by mouth every 6 (six) hours as needed for nausea or vomiting. (Patient taking differently: Take 10 mg by mouth every 6 (six) hours as needed for vomiting or nausea.) 30 tablet 1   valACYclovir (VALTREX) 1000 MG tablet Take 1 tablet (1,000 mg  total) by mouth 3 (three) times daily for 7 days 21 tablet 0   varenicline (APO-VARENICLINE) 1 MG tablet Take 1 tablet by mouth once daily in the morning with food (Patient taking differently: Take 1 mg by mouth daily.) 84 tablet 1   No current facility-administered medications for this visit.    REVIEW OF SYSTEMS:   Constitutional: ( - ) fevers, ( - )  chills , ( - ) night sweats Eyes: ( - ) blurriness of vision, ( - ) double vision, ( - ) watery eyes Ears, nose, mouth, throat, and face: ( - ) mucositis, ( - ) sore throat Respiratory: ( - ) cough, ( - ) dyspnea, ( - ) wheezes Cardiovascular: ( - ) palpitation, ( - ) chest discomfort, ( - ) lower extremity swelling Gastrointestinal:  ( +) nausea, ( - ) heartburn, ( - ) change in bowel habits Skin: ( - ) abnormal skin rashes Lymphatics: ( - ) new lymphadenopathy, ( - ) easy bruising Neurological: ( - ) numbness, ( - ) tingling, ( - ) new weaknesses Behavioral/Psych: ( - ) mood change, ( - ) new changes  All other systems were reviewed with the patient and are negative.  PHYSICAL EXAMINATION: ECOG PERFORMANCE STATUS: 1 - Symptomatic but completely ambulatory  Vitals:   02/02/22 0916  BP: 136/86  Pulse: 92  Resp: 15  Temp: 98.3 F (36.8 C)  SpO2: 100%     Filed Weights   02/02/22 0916  Weight: 125 lb 14.4 oz (57.1 kg)    GENERAL: Well-appearing middle-age Caucasian male, alert, no distress and comfortable HEAD: Anterior laceration of the scalp, well healed  SKIN: skin color, texture, turgor are normal,  no rashes or significant lesions.  EYES: conjunctiva are pink and non-injected, sclera clear LUNGS: clear to auscultation and percussion with normal breathing effort HEART: regular rate & rhythm and no murmurs and no lower extremity edema ABDOMEN: Midline incision healing. Well healing lesion on right abdomen.  EXTREMITIES: wound currently dressed.  PSYCH: alert & oriented x 3, fluent speech NEURO: no focal motor/sensory  deficits  LABORATORY DATA:  I have reviewed the data as listed    Latest Ref Rng & Units 02/02/2022    8:58 AM 01/23/2022   10:23 AM 01/20/2022   10:57 AM  CBC  WBC 4.0 - 10.5 K/uL 11.0   11.7   25.0    Hemoglobin 13.0 - 17.0 g/dL 11.2   9.8   10.4    Hematocrit 39.0 - 52.0 % 34.5   29.4   31.7    Platelets 150 - 400 K/uL 408   421   408         Latest Ref Rng & Units 02/02/2022    8:58 AM 01/23/2022   10:23 AM 01/20/2022   10:57 AM  CMP  Glucose 70 - 99 mg/dL 118   121   269    BUN 6 - 20 mg/dL _0 Creatinine 0.61 - 1.24 mg/dL 0.43   0.33   0.46    Sodium 135 - 145 mmol/L 140   131   128    Potassium 3.5 - 5.1 mmol/L 3.6   3.8   3.8    Chloride 98 - 111 mmol/L 105   98   96    CO2 22 - 32 mmol/L _1 Calcium 8.9 - 10.3 mg/dL 9.0   8.7   8.6    Total Protein 6.5 - 8.1 g/dL 7.3   7.2   7.5    Total Bilirubin 0.3 - 1.2 mg/dL 0.3   0.4   0.6    Alkaline Phos 38 - 126 U/L 184   239   223    AST 15 - 41 U/L _2 ALT 0 - 44 U/L _3 PATHOLOGY:  Procedure:    Pancreaticoduodenectomy (Whipple resection), partial pancreatectomy   TUMOR     Tumor Site:    Pancreatic head     Histologic Type:    Ductal adenocarcinoma (NOS)     Histologic Grade:    G2, moderately differentiated     Tumor Size:    Greatest Dimension (Centimeters): 3.7 cm       Additional Dimension (Centimeters):    2.6 cm       Additional Dimension (Centimeters):    2.1 cm     Site(s) Involved by Direct Tumor Extension:    Ampulla of Vater or sphincter of Oddi     Site(s) Involved by Direct Tumor Extension:    Duodenal wall     Site(s) Involved by Direct Tumor Extension:    Superior mesenteric vein     Treatment Effect:    Absent, with extensive residual cancer and no evident tumor regression (poor or no response, score 3)     Lymphovascular Invasion:    Present     Perineural Invasion:    Present   MARGINS     Margin Status for Invasive Carcinoma:    Invasive carcinoma  present at  margin       Margin(s) Involved by Invasive Carcinoma:    Pancreatic neck / parenchymal       Margin(s) Involved by Invasive Carcinoma:    Uncinate (retroperitoneal / superior mesenteric artery)     Margin Status for Dysplasia and Intraepithelial Neoplasia:    All margins negative for dysplasia and intraepithelial neoplasia   REGIONAL LYMPH NODES     Regional Lymph Node Status:           :    All regional lymph nodes negative for tumor       Number of Lymph Nodes Examined:    36   PATHOLOGIC STAGE CLASSIFICATION  (pTNM, AJCC 8th Edition)     Reporting of pT, pN, and (when applicable) pM categories is based on information available to the pathologist at the time the report is issued. As per the AJCC (Chapter 1, 8th Ed.) it is the managing physician's responsibility to establish the final pathologic stage based upon all pertinent information, including but potentially not limited to this pathology report.     TNM Descriptors:    y (post-treatment)     pT Category:    pT2     pN Category:    pN0   RADIOGRAPHIC STUDIES: I have personally reviewed the radiological images as listed and agreed with the findings in the report: Borderline resectable pancreatic mass with no evidence of metastatic disease. DG Chest 2 View  Result Date: 01/05/2022 CLINICAL DATA:  61 year old male with shortness of breath and right lower chest pain since yesterday. Status post Whipple in March. EXAM: CHEST - 2 VIEW COMPARISON:  [facility] portable chest 11/02/2021 and earlier. FINDINGS: PA and lateral views. Larger lung volumes. Stable right chest power port. Normal cardiac size and mediastinal contours. Visualized tracheal air column is within normal limits. Both lungs appear clear. No pneumothorax or pleural effusion. Partially visible surgical clips in the right upper quadrant. Negative visible bowel gas. No acute osseous abnormality identified. IMPRESSION: No acute cardiopulmonary abnormality. Electronically Signed    By: Genevie Ann M.D.   On: 01/05/2022 10:05   CT Angio Chest PE W and/or Wo Contrast  Result Date: 01/05/2022 CLINICAL DATA:  Pulmonary embolism (PE) suspected, high prob Known cancer, recent procedure, tachycardia, tachypnea, worsened exertional shortness of breath. Rule out PE versus recurrent abscess below his diaphragm EXAM: CT ANGIOGRAPHY CHEST WITH CONTRAST TECHNIQUE: Multidetector CT imaging of the chest was performed using the standard protocol during bolus administration of intravenous contrast. Multiplanar CT image reconstructions and MIPs were obtained to evaluate the vascular anatomy. RADIATION DOSE REDUCTION: This exam was performed according to the departmental dose-optimization program which includes automated exposure control, adjustment of the mA and/or kV according to patient size and/or use of iterative reconstruction technique. CONTRAST:  138m OMNIPAQUE IOHEXOL 350 MG/ML SOLN COMPARISON:  CT 12/25/2021. FINDINGS: Cardiovascular: Satisfactory opacification of the pulmonary arteries to the segmental level. No evidence of pulmonary embolism. Normal cardiac size.Small pericardial effusion, increased since 12/25/2021. Moderate atherosclerosis of the thoracic aorta. Chest port catheter tip terminates in the right atrium. Mediastinum/Nodes: No lymphadenopathy. Lungs/Pleura: No focal airspace disease. Left basilar scarring/linear atelectasis. No suspicious pulmonary nodules. Apical centrilobular and paraseptal emphysema. Upper Abdomen: No acute abnormality. Musculoskeletal: No chest wall abnormality. No acute or significant osseous findings. Review of the MIP images confirms the above findings. IMPRESSION: Increased, small pericardial effusion in comparison to prior. No pulmonary embolism or focal airspace consolidation. Apical centrilobular and paraseptal emphysema. Electronically Signed   By: JEdison Nasuti  Chancy Milroy M.D.   On: 01/05/2022 12:53   CT ABDOMEN PELVIS W CONTRAST  Result Date: 01/13/2022 CLINICAL  DATA:  Abdominal pain. Low-grade fever. Pancreatic adenocarcinoma. * Tracking Code: BO * will procedure. EXAM: CT ABDOMEN AND PELVIS WITH CONTRAST TECHNIQUE: Multidetector CT imaging of the abdomen and pelvis was performed using the standard protocol following bolus administration of intravenous contrast. RADIATION DOSE REDUCTION: This exam was performed according to the departmental dose-optimization program which includes automated exposure control, adjustment of the mA and/or kV according to patient size and/or use of iterative reconstruction technique. CONTRAST:  140m OMNIPAQUE IOHEXOL 300 MG/ML  SOLN COMPARISON:  CT 01/05/2022 FINDINGS: Lower chest: Bibasilar linear atelectasis. No airspace disease. No pleural fluid. Hepatobiliary: Small subcapsular low-density collection along the RIGHT hepatic lobe measures 2.3 by 1.2 cm (image 49/2) compared to 3.1 x 1.2 cm. There is interval evacuation of the gas within the collection seen on prior. There is a percutaneous drainage catheter inferior to this collection unchanged from prior. Small gas pocket adjacent to the tip of the catheter. No new hepatic collections are present. No biliary duct dilatation. No complication associated with the choledocho enterostomy. Pancreas: Post pancreatectomy. Spleen: Normal spleen. Adrenals/urinary tract: Adrenal glands and kidneys are normal. Bladder normal. Stomach/Bowel: Partial gastrectomy. Stomach normal otherwise. Small bowel normal. Appendix not identified. Moderate volume stool in the RIGHT colon. The colon and rectosigmoid colon are normal. Vascular/Lymphatic: Abdominal aorta is normal caliber with atherosclerotic calcification. There is no retroperitoneal or periportal lymphadenopathy. No pelvic lymphadenopathy. Musculoskeletal: No aggressive osseous lesion IMPRESSION: 1. No significant interval change from CT 01/05/2022. 2. Small collection along the subcapsular RIGHT hepatic lobe with drainage catheter in place.  Decreasing gas within the collection. 3. Post Whipple type procedure without complication. Electronically Signed   By: SSuzy BouchardM.D.   On: 01/13/2022 10:25   CT ABDOMEN PELVIS W CONTRAST  Result Date: 01/05/2022 CLINICAL DATA:  Postop abdominal pain, evaluate for abscess. Pancreatic adenocarcinoma. * Tracking Code: BO * EXAM: CT ABDOMEN AND PELVIS WITH CONTRAST TECHNIQUE: Multidetector CT imaging of the abdomen and pelvis was performed using the standard protocol following bolus administration of intravenous contrast. RADIATION DOSE REDUCTION: This exam was performed according to the departmental dose-optimization program which includes automated exposure control, adjustment of the mA and/or kV according to patient size and/or use of iterative reconstruction technique. CONTRAST:  737mOMNIPAQUE IOHEXOL 300 MG/ML  SOLN COMPARISON:  CT chest 01/05/2022, CT abdomen pelvis 12/25/2021. FINDINGS: Lower chest: Minimal subsegmental atelectasis in the lung bases. Heart size normal. Small pericardial effusion with hyperattenuation of the pericardial border. Atherosclerotic calcification of the aorta and coronary arteries. No pleural fluid. Distal esophagus is grossly unremarkable. Hepatobiliary: Liver is slightly decreased in attenuation diffusely. Loculated perihepatic fluid collection has decreased in size along the anterolateral aspect of the liver, now with the largest pocket measuring 1.2 x 3.1 cm (2/31). Indwelling percutaneous drain. Cholecystectomy. No biliary ductal dilatation. Pancreas: Pancreaticoduodenectomy. Slight pancreatic ductal dilatation in the body, 4 mm, similar. Remainder of the gland is unremarkable. Spleen: Negative. Adrenals/Urinary Tract: Adrenal glands are unremarkable. Millimetric low-attenuation lesions in the right kidney are too small to characterize. No follow-up necessary. Kidneys are otherwise unremarkable. Ureters are decompressed. Bladder is grossly unremarkable. Stomach/Bowel:  Stomach is unremarkable. Pancreaticoduodenectomy. Small bowel is otherwise unremarkable. Appendix is not readily appreciated. Fair amount of stool in the colon. Vascular/Lymphatic: Atherosclerotic calcification of the aorta. No pathologically enlarged lymph nodes. Reproductive: Prostate is visualized. Other: No free fluid.  Mesenteries and peritoneum are  unremarkable. Musculoskeletal: Degenerative changes in the spine. No worrisome lytic or sclerotic lesions. Minimal grade 1 anterolisthesis of L4 on L5. IMPRESSION: 1. Small pericardial effusion. Hyperattenuation of the pericardium suggest associated pericarditis. 2. Decreasing loculated subcapsular fluid along the liver margin, with percutaneous drain in place. 3. Hepatic steatosis. 4. Fair amount of stool in the colon is indicative of constipation. 5. Aortic atherosclerosis (ICD10-I70.0). Coronary artery calcification. Electronically Signed   By: Lorin Picket M.D.   On: 01/05/2022 13:50   ECHOCARDIOGRAM COMPLETE  Result Date: 01/05/2022    ECHOCARDIOGRAM REPORT   Patient Name:   RHYLEN SHAHEEN Date of Exam: 01/05/2022 Medical Rec #:  [id number]       Height:       71.0 in Accession #:    [id number]      Weight:       130.0 lb Date of Birth:  1961-01-13       BSA:          1.756 m Patient Age:    26 years        BP:           124/87 mmHg Patient Gender: M               HR:           107 bpm. Exam Location:  Inpatient Procedure: 2D Echo, Cardiac Doppler and Color Doppler STAT ECHO Indications:    I31.3 Pericardial effusion (noninflammatory)  History:        Patient has prior history of Echocardiogram examinations, most                 recent 10/30/2020. Abnormal ECG, Arrythmias:Tachycardia,                 Signs/Symptoms:Dyspnea and Shortness of Breath; Risk                 Factors:Current Smoker, Hypertension and Diabetes. ETOH. Cancer.  Sonographer:    Roseanna Rainbow RDCS Referring Phys: [id number] [place]  Sonographer Comments: Patient drinking grape juice  during stat echo IMPRESSIONS  1. Left ventricular ejection fraction, by estimation, is 60 to 65%. The left ventricle has normal function. The left ventricle has no regional wall motion abnormalities. Left ventricular diastolic parameters are consistent with Grade I diastolic dysfunction (impaired relaxation).  2. Right ventricular systolic function is normal. The right ventricular size is normal.  3. Moderate pericardial effusion. The pericardial effusion is circumferential. There is no evidence of cardiac tamponade.  4. The mitral valve is normal in structure. No evidence of mitral valve regurgitation. No evidence of mitral stenosis.  5. The aortic valve is normal in structure. Aortic valve regurgitation is not visualized. No aortic stenosis is present.  6. The inferior vena cava is normal in size with greater than 50% respiratory variability, suggesting right atrial pressure of 3 mmHg. FINDINGS  Left Ventricle: Left ventricular ejection fraction, by estimation, is 60 to 65%. The left ventricle has normal function. The left ventricle has no regional wall motion abnormalities. The left ventricular internal cavity size was normal in size. There is  no left ventricular hypertrophy. Left ventricular diastolic parameters are consistent with Grade I diastolic dysfunction (impaired relaxation). Right Ventricle: The right ventricular size is normal. No increase in right ventricular wall thickness. Right ventricular systolic function is normal. Left Atrium: Left atrial size was normal in size. Right Atrium: Right atrial size was normal in size. Pericardium: A moderately sized pericardial effusion is present.  The pericardial effusion is circumferential. There is no evidence of cardiac tamponade. Mitral Valve: The mitral valve is normal in structure. No evidence of mitral valve regurgitation. No evidence of mitral valve stenosis. Tricuspid Valve: The tricuspid valve is normal in structure. Tricuspid valve regurgitation is not  demonstrated. No evidence of tricuspid stenosis. Aortic Valve: The aortic valve is normal in structure. Aortic valve regurgitation is not visualized. No aortic stenosis is present. Pulmonic Valve: The pulmonic valve was normal in structure. Pulmonic valve regurgitation is not visualized. No evidence of pulmonic stenosis. Aorta: The aortic root is normal in size and structure. Venous: The inferior vena cava is normal in size with greater than 50% respiratory variability, suggesting right atrial pressure of 3 mmHg. IAS/Shunts: No atrial level shunt detected by color flow Doppler.  LEFT VENTRICLE PLAX 2D LVIDd:         3.50 cm     Diastology LVIDs:         1.90 cm     LV e' medial:    6.53 cm/s LV PW:         1.20 cm     LV E/e' medial:  10.8 LV IVS:        1.00 cm     LV e' lateral:   5.87 cm/s                            LV E/e' lateral: 12.0  LV Volumes (MOD) LV vol d, MOD A2C: 41.1 ml LV vol d, MOD A4C: 54.0 ml LV vol s, MOD A2C: 12.0 ml LV vol s, MOD A4C: 13.3 ml LV SV MOD A2C:     29.1 ml LV SV MOD A4C:     54.0 ml LV SV MOD BP:      34.5 ml RIGHT VENTRICLE             IVC RV S prime:     11.90 cm/s  IVC diam: 1.95 cm TAPSE (M-mode): 1.7 cm LEFT ATRIUM           Index       RIGHT ATRIUM          Index LA diam:      2.60 cm 1.48 cm/m  RA Area:     9.02 cm LA Vol (A2C): 8.7 ml  4.95 ml/m  RA Volume:   17.50 ml 9.97 ml/m LA Vol (A4C): 7.4 ml  4.23 ml/m  AORTIC VALVE LVOT Vmax:   98.50 cm/s LVOT Vmean:  70.600 cm/s LVOT VTI:    0.167 m  AORTA Ao Root diam: 3.60 cm MITRAL VALVE MV Area (PHT): 4.89 cm    SHUNTS MV Decel Time: 155 msec    Systemic VTI: 0.17 m MV E velocity: 70.70 cm/s MV A velocity: 87.00 cm/s MV E/A ratio:  0.81 Candee Furbish MD Electronically signed by Candee Furbish MD Signature Date/Time: 01/05/2022/4:23:33 PM    Final    ECHOCARDIOGRAM LIMITED  Result Date: 01/12/2022    ECHOCARDIOGRAM LIMITED REPORT   Patient Name:   MEGHAN TIEMANN Date of Exam: 01/12/2022 Medical Rec #:  [id number]       Height:        71.0 in Accession #:    [id number]      Weight:       127.0 lb Date of Birth:  07/13/1961       BSA:          1.738  m Patient Age:    43 years        BP:           139/80 mmHg Patient Gender: M               HR:           102 bpm. Exam Location:  [address] Procedure: Limited Echo, Cardiac Doppler and Limited Color Doppler Indications:    I31.3 Pericardial Effusion  History:        Patient has prior history of Echocardiogram examinations, most                 recent 01/05/2022. AKI; Risk Factors:Hypertension, Diabetes and                 HLD.  Sonographer:    Marygrace Drought RCS Referring Phys: [id number] [place] A CHANDRASEKHAR IMPRESSIONS  1. Small to moderate pericardial effusion which is unchanged from prior to findings to suggest tamponade. Moderate pericardial effusion. The pericardial effusion is circumferential. There is no evidence of cardiac tamponade.  2. Left ventricular ejection fraction, by estimation, is 65 to 70%. The left ventricle has normal function. The left ventricle has no regional wall motion abnormalities.  3. Right ventricular systolic function is normal. The right ventricular size is normal.  4. The mitral valve is grossly normal. Trivial mitral valve regurgitation. No evidence of mitral stenosis.  5. The aortic valve is tricuspid. Aortic valve regurgitation is not visualized. Comparison(s): No significant change from prior study. FINDINGS  Left Ventricle: Left ventricular ejection fraction, by estimation, is 65 to 70%. The left ventricle has normal function. The left ventricle has no regional wall motion abnormalities. The left ventricular internal cavity size was normal in size. There is  no left ventricular hypertrophy. Right Ventricle: The right ventricular size is normal. No increase in right ventricular wall thickness. Right ventricular systolic function is normal. Left Atrium: Left atrial size was normal in size. Right Atrium: Right atrial size was normal in size. Pericardium: Small  to moderate pericardial effusion which is unchanged from prior to findings to suggest tamponade. A moderately sized pericardial effusion is present. The pericardial effusion is circumferential. There is no evidence of cardiac tamponade. Mitral Valve: The mitral valve is grossly normal. Trivial mitral valve regurgitation. No evidence of mitral valve stenosis. Aortic Valve: The aortic valve is tricuspid. Aortic valve regurgitation is not visualized. IAS/Shunts: The atrial septum is grossly normal. LEFT VENTRICLE PLAX 2D LVIDd:         3.20 cm Diastology LVIDs:         2.10 cm LV e' medial:    7.07 cm/s LV PW:         1.00 cm LV E/e' medial:  10.5 LV IVS:        1.00 cm LV e' lateral:   4.13 cm/s                        LV E/e' lateral: 18.0  LEFT ATRIUM         Index LA diam:    3.10 cm 1.78 cm/m  MITRAL VALVE MV Area (PHT): MV Decel Time: MV E velocity: 74.40 cm/s MV A velocity: 76.50 cm/s MV E/A ratio:  0.97 Eleonore Chiquito MD Electronically signed by Eleonore Chiquito MD Signature Date/Time: 01/12/2022/12:21:49 PM    Final     ASSESSMENT & PLAN [name] 61 y.o. male with medical history significant for borderline resectable pancreatic  cancer presents for a follow up visit.   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  The patient does still have an elevation in bilirubin which likely represents a decline from the time of the latest stent placement.  He was evaluated by surgery who determined this is a borderline resectable tumor.  We will plan to start the patient on neoadjuvant chemotherapy.  The 2 options would be Gem/Abraxane or FOLFIRINOX.  Given his excellent functional status I do believe he would be a good candidate for FOLFIRINOX.  He completed 12 cycles of neoadjuvant FOLFIRINOX.  He underwent open whipple with portal vein reconstruction utilizing a R deep FV graft on  10/28/2021 at [facility].  Due to positive margins we will plan to treat with chemoradiation moving forward.  The treatment of choice will be Xeloda 850 mg/m2 on radiation days (5 days per week) over the course of radiation.  Currently radiation oncology intends for 5-1/2 weeks of treatment.  # Adenocarcinoma of the Pancreas, Stage IIB -- At this time disease appears borderline resectable.  We will proceed with neoadjuvant chemotherapy.  At this time would prefer a regimen of FOLFIRINOX given his good baseline health. -- case reviewed by surgery, who agrees his disease is borderline resectable.  -- baseline elevations in both CA 19-9 and CEA. Continue to monitor during treatment.  -- Cycle 1 Day 1 of FOLFIRINOX started 04/10/2021. Cycle 12 Day 1 on 09/24/2021, completed neoadjuvant chemotherapy. --Underwent open whipple with portal vein reconstruction utilizing a R deep FV graft on 10/28/2021 at [facility]. Noted to have positive margins, adjuvant chemoradiation was recommended. Plan: --Regimen can be started when the patient's infection is under control he is better healed from his surgery.  This still remains a barrier. --will start chemoradiation with Xeloda as soon as is feasible per Rad/onc --plan for Xeloda 850 mg/m2 on radiation days (5 days per week) over the course of radiation.  --Regimen can be started when the patient's infection is under control he is better healed from his surgery.  This still remains a barrier. --RTC in 2 weeks time to reassess   #Forehead laceration: --well healing, markedly improved.   #Post Surgical Infections-improving --WBC is 11.0, ANC 7.0 -- currently following with [facility] service.   #Gastroparesis #Nausea/Vomiting/Poor PO Intake --patient advanced diet to solid foods.  --nausea meds as below. --continue to work on glycemic control --continue to monitor.   #Chemotherapy induced Anemia and Thrombocytopenia --Hgb is stable at 11.2,  stable. continue to monitor --Plt increased to 408 K --continue to monitor   # Pain Control -- Patient currently taking ibuprofen for his abdominal pain --continue oxycodone 10 mg every 6 hours as needed. He has consistently been taking 94m PO daily total. --continue Xtampza 9 mg q 12H   #Hyperglycemia: --today's glucose level is 118 --insulin-requiring type 2 DM --under the care of endocrinology  #Left foot wound: -- He has established care with wound care center -- Foot wound was observed today and does not appear to be actively infected.  No erythema or tenderness.  No purulent discharge.  #Supportive Care -- chemotherapy education complete -- port placed -- zofran [id number] PRN and compazine 1050mO q6H for nausea --Creon to help with loose stools. --lomotil q 6 H PRN for diarrhea.  -- EMLA cream for port  No orders of the defined types were placed in this  encounter.  All questions were answered. The patient knows to call the clinic with any problems, questions or concerns.  I have spent a total of 30 minutes minutes of face-to-face and non-face-to-face time, preparing to see the patient,  performing a medically appropriate examination, counseling and educating the patient, ordering tests/procedures, documenting clinical information in the electronic health record and care coordination.   Ledell Peoples, MD Department of Hematology/Oncology [place] [facility] Phone: [phone] Pager: [phone] Email: [email or web address]

## 2022-02-03 ENCOUNTER — Telehealth: Payer: Self-pay | Admitting: Pharmacy Technician

## 2022-02-03 ENCOUNTER — Encounter: Payer: Self-pay | Admitting: Hematology and Oncology

## 2022-02-03 ENCOUNTER — Encounter: Payer: Self-pay | Admitting: Radiation Oncology

## 2022-02-03 ENCOUNTER — Other Ambulatory Visit (HOSPITAL_COMMUNITY): Payer: Self-pay

## 2022-02-03 ENCOUNTER — Telehealth: Payer: Self-pay | Admitting: Pharmacist

## 2022-02-03 DIAGNOSIS — C250 Malignant neoplasm of head of pancreas: Secondary | ICD-10-CM

## 2022-02-03 MED ORDER — CAPECITABINE 500 MG PO TABS
850.0000 mg/m2 | ORAL_TABLET | Freq: Two times a day (BID) | ORAL | 0 refills | Status: AC
  Filled 2022-02-03 (×2): qty 180, 30d supply, fill #0
  Filled 2022-02-04: qty 120, 28d supply, fill #0
  Filled 2022-02-23: qty 60, 10d supply, fill #1

## 2022-02-03 MED ORDER — CAPECITABINE 500 MG PO TABS
850.0000 mg/m2 | ORAL_TABLET | Freq: Two times a day (BID) | ORAL | 0 refills | Status: DC
  Filled 2022-02-03: qty 180, 30d supply, fill #0

## 2022-02-03 NOTE — Telephone Encounter (Signed)
Oral Oncology Pharmacist Encounter  Received new prescription for Xeloda (capecitabine) for the treatment of stage IIB pancreatic cancer in conjunction with radiation, planned for duration of radiation, likely 5 1/2 weeks.  CBC w/ Diff and CMP from 02/02/22 assessed, no baseline dose adjustments required. Prescription dose and frequency assessed. Dosing 850 mg/m2 BID on days of radiation per MD.   Current medication list in Epic reviewed, DDIs with Xeloda identified: DDI between Xeloda and Pantoprazole - proton-pump inhibitors can decrease efficacy of Xeloda - will discuss with patient alternatives to pantoprazole, such as H2RA's like famotidine while on Xeloda. DDI between Xeloda and Ondansetron due to risk of Qtc prolongation with fluorouracil products. Noted patient only taking PRN and PO route, risk higher with IV administration. No change in therapy warranted at this time.   Evaluated chart and no patient barriers to medication adherence noted.   Patient agreement for treatment documented in MD note on 02/03/22.  Prescription has been e-scribed to the [facility] for benefits analysis and approval.  Oral Oncology Clinic will continue to follow for insurance authorization, copayment issues, initial counseling and start date.  Leron Croak, PharmD, BCPS Hematology/Oncology Clinical Pharmacist [facility] and [place] [phone] 02/03/2022 12:36 PM

## 2022-02-03 NOTE — Telephone Encounter (Signed)
Oral Oncology Patient Advocate Encounter  After completing a benefits investigation, prior authorization for Capecitabine (Xeloda) is not required at this time through [place] Medicaid.  Patient's copay is $4.     Lady Deutscher, CPhT-Adv Pharmacy Patient Advocate Specialist [place] Patient Advocate Team Direct Number: [phone]  Fax: [phone]

## 2022-02-04 ENCOUNTER — Other Ambulatory Visit: Payer: Self-pay | Admitting: Nurse Practitioner

## 2022-02-04 ENCOUNTER — Other Ambulatory Visit (HOSPITAL_COMMUNITY): Payer: Self-pay

## 2022-02-04 ENCOUNTER — Other Ambulatory Visit: Payer: Self-pay | Admitting: Physician Assistant

## 2022-02-04 ENCOUNTER — Ambulatory Visit: Payer: Medicaid Other | Attending: Nurse Practitioner | Admitting: Nurse Practitioner

## 2022-02-04 VITALS — BP 132/85 | HR 108 | Temp 98.4°F | Resp 12 | Wt 126.0 lb

## 2022-02-04 DIAGNOSIS — C250 Malignant neoplasm of head of pancreas: Secondary | ICD-10-CM

## 2022-02-04 DIAGNOSIS — Z09 Encounter for follow-up examination after completed treatment for conditions other than malignant neoplasm: Secondary | ICD-10-CM | POA: Diagnosis not present

## 2022-02-04 DIAGNOSIS — Z794 Long term (current) use of insulin: Secondary | ICD-10-CM

## 2022-02-04 DIAGNOSIS — E1140 Type 2 diabetes mellitus with diabetic neuropathy, unspecified: Secondary | ICD-10-CM

## 2022-02-04 DIAGNOSIS — R195 Other fecal abnormalities: Secondary | ICD-10-CM

## 2022-02-04 DIAGNOSIS — R6889 Other general symptoms and signs: Secondary | ICD-10-CM

## 2022-02-04 DIAGNOSIS — R5381 Other malaise: Secondary | ICD-10-CM

## 2022-02-04 DIAGNOSIS — R296 Repeated falls: Secondary | ICD-10-CM

## 2022-02-04 DIAGNOSIS — E43 Unspecified severe protein-calorie malnutrition: Secondary | ICD-10-CM

## 2022-02-04 DIAGNOSIS — F5101 Primary insomnia: Secondary | ICD-10-CM

## 2022-02-04 MED ORDER — DOXYCYCLINE HYCLATE 100 MG PO TABS
ORAL_TABLET | ORAL | 0 refills | Status: DC
Start: 2022-02-04 — End: 2022-03-18
  Filled 2022-02-04: qty 20, 10d supply, fill #0

## 2022-02-04 MED ORDER — PROCHLORPERAZINE MALEATE 10 MG PO TABS
10.0000 mg | ORAL_TABLET | Freq: Four times a day (QID) | ORAL | 1 refills | Status: DC | PRN
  Filled 2022-02-04: qty 30, 8d supply, fill #0
  Filled 2022-03-23: qty 30, 8d supply, fill #1

## 2022-02-04 MED ORDER — MUPIROCIN 2 % EX OINT
TOPICAL_OINTMENT | CUTANEOUS | 0 refills | Status: DC
  Filled 2022-02-04: qty 22, 22d supply, fill #0
  Filled 2022-02-21: qty 22, 22d supply, fill #1

## 2022-02-04 MED ORDER — ATORVASTATIN CALCIUM 20 MG PO TABS
20.0000 mg | ORAL_TABLET | Freq: Every day | ORAL | 0 refills | Status: DC
  Filled 2022-02-04: qty 90, 90d supply, fill #0

## 2022-02-04 MED ORDER — ZOLPIDEM TARTRATE 5 MG PO TABS
5.0000 mg | ORAL_TABLET | Freq: Every evening | ORAL | 1 refills | Status: DC | PRN
  Filled 2022-02-04: qty 15, 30d supply, fill #0
  Filled 2022-02-18: qty 15, 30d supply, fill #1

## 2022-02-04 MED ORDER — ONDANSETRON HCL 8 MG PO TABS
8.0000 mg | ORAL_TABLET | Freq: Two times a day (BID) | ORAL | 1 refills | Status: DC | PRN
  Filled 2022-02-04: qty 30, 15d supply, fill #0
  Filled 2022-03-23: qty 30, 15d supply, fill #1

## 2022-02-04 MED ORDER — DIPHENOXYLATE-ATROPINE 2.5-0.025 MG PO TABS
1.0000 | ORAL_TABLET | Freq: Four times a day (QID) | ORAL | 1 refills | Status: DC | PRN
  Filled 2022-02-04: qty 60, 15d supply, fill #0
  Filled 2022-03-23: qty 60, 15d supply, fill #1

## 2022-02-04 NOTE — Telephone Encounter (Signed)
Requested medication (s) are due for refill today: Yes  Requested medication (s) are on the active medication list:Yes  Last refill:  10/07/21  Future visit scheduled:Yes  Notes to clinic:  Unable to refill per protocol, cannot delegate.      Requested Prescriptions  Pending Prescriptions Disp Refills   diphenoxylate-atropine (LOMOTIL) 2.5-0.025 MG tablet 60 tablet 1    Sig: Take 1 tablet by mouth 4 (four) times daily as needed for diarrhea or loose stools.     Not Delegated - Gastroenterology:  Antidiarrheals Failed - 02/04/2022  4:23 PM      Failed - This refill cannot be delegated      Passed - Valid encounter within last 12 months    Recent Outpatient Visits           Today Sensation of change in body temperature   [place], Vernia Buff, NP   1 month ago Essential hypertension   [place], Vernia Buff, NP   4 months ago Loose stools   [place], Vernia Buff, NP   7 months ago GERD without esophagitis   [place], Vernia Buff, NP   8 months ago Dislocation of finger, subsequent encounter   [place], Vernia Buff, NP       Future Appointments             Tomorrow Bhagat, [place], [place] [facility], [facility]   In 1 month [place], Vernia Buff, NP [place]             Signed Prescriptions Disp Refills   atorvastatin (LIPITOR) 20 MG tablet 90 tablet 0    Sig: Take 1 tablet (20 mg total) by mouth daily.     Cardiovascular:  Antilipid - Statins Failed - 02/04/2022  4:23 PM      Failed - Lipid Panel in normal range within the last 12 months    Cholesterol, Total  Date Value Ref Range Status  04/28/2021 174 100 - 199 mg/dL Final   LDL Chol Calc (NIH)  Date Value Ref Range Status  04/28/2021 89 0 - 99 mg/dL Final   HDL  Date Value  Ref Range Status  04/28/2021 70 >39 mg/dL Final   Triglycerides  Date Value Ref Range Status  04/28/2021 78 0 - 149 mg/dL Final         Passed - Patient is not pregnant      Passed - Valid encounter within last 12 months    Recent Outpatient Visits           Today Sensation of change in body temperature   [place], Vernia Buff, NP   1 month ago Essential hypertension   [place], Vernia Buff, NP   4 months ago Loose stools   [place], Vernia Buff, NP   7 months ago GERD without esophagitis   [place], Vernia Buff, NP   8 months ago Dislocation of finger, subsequent encounter   [place], Vernia Buff, NP       Future Appointments             Tomorrow Bhagat, [place], [place] [facility] [employer], [facility]  In 1 month [name], NP [place]

## 2022-02-04 NOTE — Progress Notes (Signed)
States blood sugars are too low- has Endocrinology Sleep concerns

## 2022-02-04 NOTE — Telephone Encounter (Signed)
Oral Chemotherapy Pharmacist Encounter  I spoke with patient for overview of: Xeloda for the treatment of stage IIB pancreatic cancer in conjunction with radiation, planned duration 5 1/2.  Counseled patient on administration, dosing, side effects, monitoring, drug-food interactions, safe handling, storage, and disposal.  Patient will take Xeloda '500mg'$  tablets, 3 tablets ('1500mg'$ ) by mouth in AM and 3 tabs ('1500mg'$ ) by mouth in PM, within 30 minutes of finishing meals, on days of radiation only.  Xeloda and radiation start date: tentatively 02/16/22 - patient expressed understanding that he is to not Xeloda until the first day of radiation  Adverse effects of Xeloda include but are not limited to: fatigue, decreased blood counts, GI upset, diarrhea, mouth sores, and hand-foot syndrome.  Patient has anti-emetic on hand and knows to take it if nausea develops.   Patient will obtain anti diarrheal and alert the office of 4 or more loose stools above baseline.   Reviewed with patient importance of keeping a medication schedule and plan for any missed doses. No barriers to medication adherence identified.  Medication reconciliation performed and medication/allergy list updated. Discussed with patient drug-drug interaction between pantoprazole and Xeloda. Patient aware that pantoprazole can decrease efficacy of Xeloda - he is agreeable to holding this medication while he is on the Xeloda and utilizing famotidine PRN for indigestion/heartburn while taking the Xeloda.   Insurance authorization for Xeloda has been obtained. Test claim at the pharmacy revealed copayment $4 for 1st fill of Xeloda. This will ship from the [facility] on 02/05/22 to deliver to patient's home on 02/06/22.  Patient informed the pharmacy will reach out 5-7 days prior to needing next fill of Xeloda to coordinate continued medication acquisition to prevent break in therapy.  All questions answered.  [name] [name]  voiced understanding and appreciation.   Medication education handout emailed to patient. Patient knows to call the office with questions or concerns. Oral Chemotherapy Clinic phone number provided to patient.   Leron Croak, PharmD, BCPS Hematology/Oncology Clinical Pharmacist [facility] and [place] [phone] 02/04/2022 2:56 PM

## 2022-02-04 NOTE — Telephone Encounter (Signed)
Requested Prescriptions  Pending Prescriptions Disp Refills  . atorvastatin (LIPITOR) 20 MG tablet 90 tablet 0    Sig: Take 1 tablet (20 mg total) by mouth daily.     Cardiovascular:  Antilipid - Statins Failed - 02/04/2022  4:23 PM      Failed - Lipid Panel in normal range within the last 12 months    Cholesterol, Total  Date Value Ref Range Status  04/28/2021 174 100 - 199 mg/dL Final   LDL Chol Calc (NIH)  Date Value Ref Range Status  04/28/2021 89 0 - 99 mg/dL Final   HDL  Date Value Ref Range Status  04/28/2021 70 >39 mg/dL Final   Triglycerides  Date Value Ref Range Status  04/28/2021 78 0 - 149 mg/dL Final         Passed - Patient is not pregnant      Passed - Valid encounter within last 12 months    Recent Outpatient Visits          Today Sensation of change in body temperature   [place], Vernia Buff, NP   1 month ago Essential hypertension   [place], Vernia Buff, NP   4 months ago Loose stools   [place], Vernia Buff, NP   7 months ago GERD without esophagitis   [place], Vernia Buff, NP   8 months ago Dislocation of finger, subsequent encounter   [place], Vernia Buff, NP      Future Appointments            Tomorrow Bhagat, [place], [place], [facility]   In 1 month [place], Vernia Buff, NP [place]           . diphenoxylate-atropine (LOMOTIL) 2.5-0.025 MG tablet 60 tablet 1    Sig: Take 1 tablet by mouth 4 (four) times daily as needed for diarrhea or loose stools.     Not Delegated - Gastroenterology:  Antidiarrheals Failed - 02/04/2022  4:23 PM      Failed - This refill cannot be delegated      Passed - Valid encounter within last 12 months    Recent Outpatient Visits          Today Sensation of change  in body temperature   [place], Vernia Buff, NP   1 month ago Essential hypertension   [place], Vernia Buff, NP   4 months ago Loose stools   [place], Vernia Buff, NP   7 months ago GERD without esophagitis   [place], Vernia Buff, NP   8 months ago Dislocation of finger, subsequent encounter   [place], Vernia Buff, NP      Future Appointments            Tomorrow Bhagat, [place], [place], [facility]   In 1 month [name], NP [place]

## 2022-02-04 NOTE — Telephone Encounter (Signed)
Should also be using creon for loose stools

## 2022-02-04 NOTE — Progress Notes (Signed)
Assessment & Plan:  [name] [name] was seen today for hospitalization follow-up.  Diagnoses and all orders for this visit:  Hospital discharge follow-up  Type 2 diabetes mellitus with diabetic neuropathy, with long-term current use of insulin ([place]) Patient instructed to reach out to Dr. Renne Crigler and upload his readings from his freestyle due to his hypoglycemic episodes    Primary insomnia -     zolpidem (AMBIEN) 5 MG tablet; Take 1 tablet (5 mg total) by mouth at bedtime as needed for sleep.  Physical deconditioning -     Ambulatory referral to Home Health  Protein-calorie malnutrition, severe -     Ambulatory referral to [place]  Frequent falls -     Ambulatory referral to Home Health  Sensation of change in body temperature -     Thyroid Panel With TSH    Patient has been counseled on age-appropriate routine health concerns for screening and prevention. These are reviewed and up-to-date. Referrals have been placed accordingly. Immunizations are up-to-date or declined.    Subjective:   Chief Complaint  Patient presents with   Hospitalization Follow-up   HPI [name] [name] 61 y.o. male presents to office today for [place]. 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  Currently being followed by the wound care center for left foot wound. I have ordered [facility] today for PT and RN (evaluate sacral wound)     HFU  Admitted 01-14-2022 through 01-17-2022 with abdominal pain and purulent drainage from drain site and leukocytosis s/p whipple with portal vein reconstruction (10-28-2021). He was started on IV abx and general surgery was consulted. He was also  treated for stage 2 pressure injury (lower right buttock/upper ischium) and left foot wound. Surgical drainage catheter was removed and perihepatic abscess resolved. He is currently still on doxycycline po  Insomnia Notes difficulty falling and staying asleep. Has tried [place] in the past.   DM 2 He has his freestyle with him today. States the alarm is going off in the early morning around 2-3am due to hypoglycemia. He is still not on a routine regimen of eating at set times. He has been adjusting his tresiba based on his early am readings and also if he is not eating he will drop the amount administered from 24 units to 15 units. I have advised him not to do this and recommended he reach out to Dr. Renne Crigler. He is also not administering Lyumjev at all at this time due to his readings.   I have referred him for [facility] evaluation today due to frequent falls, sacral wound stage 2, and need to skilled nursing evaluation and PT evaluation. He has protein calorie malnutrition and is deconditioned due to his cancer and recent hospital admissions.    Review of Systems  Constitutional:  Positive for malaise/fatigue and weight loss. Negative for fever.  HENT: Negative.  Negative for nosebleeds.   Eyes: Negative.  Negative for blurred vision, double vision and photophobia.  Respiratory:  Positive for shortness of breath (deconditioned). Negative for cough.   Cardiovascular: Negative.  Negative for chest pain, palpitations and leg swelling.  Gastrointestinal:  Positive for diarrhea. Negative for heartburn, nausea and vomiting.  Musculoskeletal: Negative.  Negative for myalgias.  Neurological:  Positive for weakness (in a wheelchair today). Negative for dizziness, focal weakness, seizures and headaches.  Psychiatric/Behavioral:  Negative for suicidal ideas. The patient has insomnia.    Past Medical History:  Diagnosis Date   Barrett's esophagus dx 2016   Bronchitis    Chronic hepatitis C without  hepatic coma ([place]) 10/31/2014   Depression    Diabetes [facility])    ED (erectile dysfunction)    GERD (gastroesophageal reflux disease)    Hepatitis C    Hiatal hernia 10/2014   3cm   Neuropathy    pancreatic ca 12/2020   S/P transmetatarsal amputation of foot, left ([place]) 11/28/2020    Past Surgical History:  Procedure Laterality Date   AMPUTATION Left 11/15/2020   Procedure: LEFT TRANSMETATARSALS AMPUTATION;  Surgeon: Newt Minion, MD;  Location: [facility];  Service: Orthopedics;  Laterality: Left;   APPENDECTOMY     BILIARY STENT PLACEMENT N/A 02/27/2021   Procedure: BILIARY STENT PLACEMENT;  Surgeon: Milus Banister, MD;  Location: [facility];  Service: Endoscopy;  Laterality: N/A;   BILIARY STENT PLACEMENT N/A 03/27/2021   Procedure: BILIARY STENT PLACEMENT;  Surgeon: Jackquline Denmark, MD;  Location: [facility];  Service: Endoscopy;  Laterality: N/A;   BIOPSY  03/27/2021   Procedure: BIOPSY;  Surgeon: Jackquline Denmark, MD;  [facility];  Service: Endoscopy;;   COLONOSCOPY N/A 02/16/2014   Procedure: COLONOSCOPY;  Surgeon: Gatha Mayer, MD;  [facility];  Service: Endoscopy;  Laterality: N/A;   COLONOSCOPY     ENDOSCOPIC RETROGRADE CHOLANGIOPANCREATOGRAPHY (ERCP) WITH PROPOFOL N/A 02/27/2021   Procedure: ENDOSCOPIC RETROGRADE CHOLANGIOPANCREATOGRAPHY (ERCP) WITH PROPOFOL;  Surgeon: Milus Banister, MD;  [facility];  Service: Endoscopy;  Laterality: N/A;   ERCP N/A 03/27/2021   Procedure: ENDOSCOPIC RETROGRADE CHOLANGIOPANCREATOGRAPHY (ERCP);  Surgeon: Jackquline Denmark, MD;  Location[facility];  Service: Endoscopy;  Laterality: N/A;   ESOPHAGOGASTRODUODENOSCOPY (EGD) WITH PROPOFOL N/A 05/06/2021   Procedure: ESOPHAGOGASTRODUODENOSCOPY (EGD) WITH PROPOFOL;  Surgeon: Mauri Pole, MD;  [facility];  Service: Endoscopy;  Laterality: N/A;   EUS N/A 02/27/2021   Procedure: UPPER ENDOSCOPIC ULTRASOUND (EUS) RADIAL;  Surgeon: Milus Banister, MD;   [facility];  Service: Endoscopy;  Laterality: N/A;   FINE NEEDLE ASPIRATION N/A 02/27/2021   Procedure: FINE NEEDLE ASPIRATION (FNA) LINEAR;  Surgeon: Milus Banister, MD;  [facility];  Service: Endoscopy;  Laterality: N/A;   IR IMAGING GUIDED PORT INSERTION  03/21/2021   SPHINCTEROTOMY  02/27/2021   Procedure: SPHINCTEROTOMY;  Surgeon: Milus Banister, MD;  [facility];  Service: Endoscopy;;   STENT REMOVAL  03/27/2021   Procedure: STENT REMOVAL;  Surgeon: Jackquline Denmark, MD;  [facility];  Service: Endoscopy;;   TONSILLECTOMY      Family History  Problem Relation Age of Onset   Breast cancer Mother    Stroke Father    Alcohol abuse Father    Heart disease Maternal Grandfather    Pancreatic cancer Paternal Grandmother    Diabetes Paternal Grandfather    Colon cancer Neg Hx    Stomach cancer Neg Hx    Rectal cancer Neg Hx    Esophageal cancer Neg Hx     Social History Reviewed with no changes to be made today.   Outpatient Medications Prior to Visit  Medication Sig Dispense Refill   acetaminophen (TYLENOL) 500 MG tablet Take 500 mg by mouth every 6 (six) hours as needed for moderate  pain.     albuterol (VENTOLIN HFA) 108 (90 Base) MCG/ACT inhaler Inhale 2 puffs into the lungs every 6 (six) hours as needed for wheezing or shortness of breath. 18 g 0   aspirin 81 MG EC tablet Take 1 tablet by mouth daily. 100 tablet 0   chlorhexidine (PERIDEX) 0.12 % solution Rinse between teeth using irrigation syringe (15 mL) 2-3 times a day or as needed for gum irritation. (Patient taking differently: Use as directed 5 mLs in the mouth or throat daily as needed (for gum irriation).) 473 mL 2   DULoxetine (CYMBALTA) 60 MG capsule Take 1 capsule (60 mg total) by mouth daily. 90 capsule 0   EPINEPHrine 0.3 mg/0.3 mL IJ SOAJ injection Inject 0.3 mg into the muscle once as needed for anaphylaxis.     folic acid (FOLVITE) 1 MG tablet Take 1 tablet (1 mg total)  by mouth in the morning.     insulin degludec (TRESIBA FLEXTOUCH) 100 UNIT/ML FlexTouch Pen Inject 24 Units into the skin daily. 9 mL 3   Insulin Lispro-aabc (LYUMJEV KWIKPEN) 100 UNIT/ML KwikPen Inject 5-8 Units into the skin 3 (three) times daily before meals. 15 mL 3   lidocaine-prilocaine (EMLA) cream Apply 1 application topically as directed as needed. 30 g 0   lipase/protease/amylase (CREON) 12000-38000 units CPEP capsule Take 15,400-86,761 Units by mouth See admin instructions. Take 2 capsules by mouth daily with meals and may take 1 capsule by mouth as needed with snakes.     mirtazapine (REMERON) 30 MG tablet Take 1 tablet (30 mg total) by mouth at bedtime. 90 tablet 0   Multiple Vitamins-Minerals (THEREMS-M) TABS Take 1 tablet by mouth in the morning. (Patient taking differently: Take 1 tablet by mouth daily.) 90 tablet 3   nicotine (NICODERM CQ - DOSED IN MG/24 HOURS) 14 mg/24hr patch Apply one patch daily x 12 weeks. 84 patch 1   oxyCODONE (OXY IR/ROXICODONE) 5 MG immediate release tablet Take 1 to 2 tablets by mouth every 6 hours as needed for severe pain. 120 tablet 0   oxyCODONE ER (XTAMPZA ER) 9 MG C12A Take 1 capsule by mouth every 12 hours. 60 capsule 0   pantoprazole (PROTONIX) 40 MG tablet Take 1 tablet (40 mg total) by mouth daily. (Patient not taking: Reported on 02/04/2022) 90 tablet 0   atorvastatin (LIPITOR) 20 MG tablet Take 1 tablet (20 mg total) by mouth daily. 90 tablet 0   diphenoxylate-atropine (LOMOTIL) 2.5-0.025 MG tablet Take 1 tablet by mouth 4 (four) times daily as needed for diarrhea or loose stools. 60 tablet 1   ondansetron (ZOFRAN) 8 MG tablet Take 1 tablet (8 mg total) by mouth 2 (two) times daily as needed. Start on day 3 after chemotherapy. (Patient taking differently: Take 8 mg by mouth 2 (two) times daily as needed for nausea.) 30 tablet 1   prochlorperazine (COMPAZINE) 10 MG tablet Take 1 tablet (10 mg total) by mouth every 6 (six) hours as needed for nausea  or vomiting. 30 tablet 1   Accu-Chek Softclix Lancets lancets Check blood glucose level by fingerstick 3-4 times per day. 100 each 3   Blood Glucose Monitoring Suppl (ACCU-CHEK GUIDE) w/Device KIT Check blood glucose level by fingerstick 3-4 times per day. 1 kit 0   capecitabine (XELODA) 500 MG tablet Take 3 tablets (1,500 mg total) by mouth 2 (two) times daily after a meal. Take only on days of radiation Monday through Friday. (Patient not taking: Reported on 02/04/2022) 180 tablet  0   Continuous Blood Gluc Receiver (FREESTYLE LIBRE 2 READER) DEVI Use as instructed. Check blood glucose level by fingerstick 4 - 5 per day. 1 each 1   Continuous Blood Gluc Sensor (FREESTYLE LIBRE 2 SENSOR) MISC Use as directed. 1 each 6   glucose blood (ACCU-CHEK GUIDE) test strip Check blood glucose level by fingerstick 3 - 4 times per day. 100 each 12   Insulin Pen Needle (TRUEPLUS PEN NEEDLES) 32G X 4 MM MISC Use as instructed. Inject into the skin twice daily 200 each 6   Insulin Pen Needle 32G X 4 MM MISC Use as directed with humulin kwikpen. 100 each 3   polyethylene glycol powder (GLYCOLAX/MIRALAX) 17 GM/SCOOP powder Take 17 g by mouth at bedtime. (Patient not taking: Reported on 02/04/2022) 238 g 0   varenicline (APO-VARENICLINE) 1 MG tablet Take 1 tablet by mouth once daily in the morning with food (Patient not taking: Reported on 02/04/2022) 84 tablet 1   valACYclovir (VALTREX) 1000 MG tablet Take 1 tablet (1,000 mg total) by mouth 3 (three) times daily for 7 days (Patient not taking: Reported on 02/04/2022) 21 tablet 0   No facility-administered medications prior to visit.    Allergies  Allergen Reactions   Bee Venom Anaphylaxis   Lactose Intolerance (Gi) Other (See Comments)    GI upset       Objective:    BP 132/85   Pulse (!) 108   Temp 98.4 F (36.9 C) (Oral)   Resp 12   Wt 126 lb (57.2 kg)   SpO2 98%   BMI 17.57 kg/m  Wt Readings from Last 3 Encounters:  02/04/22 126 lb (57.2 kg)  02/02/22  125 lb 14.4 oz (57.1 kg)  01/21/22 131 lb 6.4 oz (59.6 kg)    Physical Exam Vitals and nursing note reviewed.  Constitutional:      Appearance: He is well-developed.  HENT:     Head: Normocephalic and atraumatic.  Cardiovascular:     Rate and Rhythm: Normal rate and regular rhythm.     Heart sounds: Normal heart sounds. No murmur heard.   No friction rub. No gallop.  Pulmonary:     Effort: Pulmonary effort is normal. No tachypnea or respiratory distress.     Breath sounds: Normal breath sounds. No decreased breath sounds, wheezing, rhonchi or rales.  Chest:     Chest wall: No tenderness.  Abdominal:     General: Bowel sounds are normal.     Palpations: Abdomen is soft.  Musculoskeletal:        General: Normal range of motion.     Cervical back: Normal range of motion.  Skin:    General: Skin is warm and dry.  Neurological:     Mental Status: He is alert and oriented to person, place, and time.     Coordination: Coordination normal.  Psychiatric:        Behavior: Behavior normal. Behavior is cooperative.        Thought Content: Thought content normal.        Judgment: Judgment normal.         Patient has been counseled extensively about nutrition and exercise as well as the importance of adherence with medications and regular follow-up. The patient was given clear instructions to go to ER or return to medical center if symptoms don't improve, worsen or new problems develop. The patient verbalized understanding.   Follow-up: Return in about 3 months (around 05/07/2022).   [name],  FNP-BC [facility] and [facility] [place], [place]   02/05/2022, 12:16 AM

## 2022-02-05 ENCOUNTER — Other Ambulatory Visit (HOSPITAL_COMMUNITY): Payer: Self-pay

## 2022-02-05 ENCOUNTER — Encounter: Payer: Self-pay | Admitting: Nurse Practitioner

## 2022-02-05 ENCOUNTER — Encounter: Payer: Self-pay | Admitting: Physician Assistant

## 2022-02-05 ENCOUNTER — Ambulatory Visit (INDEPENDENT_AMBULATORY_CARE_PROVIDER_SITE_OTHER): Payer: Medicaid Other | Admitting: Physician Assistant

## 2022-02-05 ENCOUNTER — Other Ambulatory Visit: Payer: Self-pay | Admitting: Hematology and Oncology

## 2022-02-05 VITALS — BP 110/70 | HR 90 | Ht 71.0 in | Wt 124.0 lb

## 2022-02-05 DIAGNOSIS — I700 Atherosclerosis of aorta: Secondary | ICD-10-CM

## 2022-02-05 DIAGNOSIS — I10 Essential (primary) hypertension: Secondary | ICD-10-CM

## 2022-02-05 DIAGNOSIS — I3139 Other pericardial effusion (noninflammatory): Secondary | ICD-10-CM | POA: Diagnosis not present

## 2022-02-05 LAB — THYROID PANEL WITH TSH
Free Thyroxine Index: 1.6 (ref 1.2–4.9)
T3 Uptake Ratio: 30 % (ref 24–39)
T4, Total: 5.4 ug/dL (ref 4.5–12.0)
TSH: 2.19 u[IU]/mL (ref 0.450–4.500)

## 2022-02-05 MED ORDER — XTAMPZA ER 9 MG PO C12A
9.0000 mg | EXTENDED_RELEASE_CAPSULE | Freq: Two times a day (BID) | ORAL | 0 refills | Status: DC
Start: 2022-02-05 — End: 2022-03-11
  Filled 2022-02-05: qty 60, 30d supply, fill #0

## 2022-02-05 NOTE — Progress Notes (Signed)
Cardiology Office Note:    Date:  02/05/2022   ID:  [name] [name], DOB Jul 16, 1961, MRN [id number]  PCP:  [name], NP  [facility] Cardiologist:  Werner Lean, MD  [facility] Electrophysiologist:  None   Chief Complaint: hospital follow up   History of Present Illness:    [name] [name] is a 61 y.o. male with a hx of tobacco abuse, Barrett's esophagus, cirrhosis due to hepatitis C and history of alcohol use, cured hep C, stage IIB pancreatic adenocarcinoma s/p chemotherapy and  open whipple with portal vein reconstruction 10/28/21 with subsequent surgical site infection and intraabdominal abscess, depression, type 2 diabetes with neuropathy, s/p left foot TMA, chronic left foot wound, hypogonadism, aortic atherosclerosis, gastroparesis presented for hospital follow up.   Admitted 12/2021 for acute hypoxic respiratory failure and ongoing abdominal pain. CT was negative for PE. Echo showed EF of 55% with grade 1 diastolic heart failure moderate pericardial effusion no evidence of tamponade.  Seen by cardiology for pericardial effusion. Felt idiopathic vs malignant vs pericarditis.   Limited echo post discharge showed mild to moderate pericardial effusion. No tamponade. No change from prior.   Patient is here for follow-up.  He denies chest pain, orthopnea, PND, syncope, lower extremity edema or melena.  Intermittent brief shortness of breath lasting for few seconds.  Compliant with his medication.  Limited ambulation due to chronic left foot wound.  Past Medical History:  Diagnosis Date   Barrett's esophagus dx 2016   Bronchitis    Chronic hepatitis C without hepatic coma ([place]) 10/31/2014   Depression    Diabetes [facility])    ED (erectile dysfunction)    GERD (gastroesophageal reflux disease)    Hepatitis C    Hiatal hernia 10/2014   3cm   Neuropathy    pancreatic ca 12/2020   S/P transmetatarsal amputation of foot, left ([place]) 11/28/2020    Past Surgical  History:  Procedure Laterality Date   AMPUTATION Left 11/15/2020   Procedure: LEFT TRANSMETATARSALS AMPUTATION;  Surgeon: Newt Minion, MD;  Location: [place];  Service: Orthopedics;  Laterality: Left;   APPENDECTOMY     BILIARY STENT PLACEMENT N/A 02/27/2021   Procedure: BILIARY STENT PLACEMENT;  Surgeon: Milus Banister, MD;  Location: [facility];  Service: Endoscopy;  Laterality: N/A;   BILIARY STENT PLACEMENT N/A 03/27/2021   Procedure: BILIARY STENT PLACEMENT;  Surgeon: Jackquline Denmark, MD;  Location: [facility];  Service: Endoscopy;  Laterality: N/A;   BIOPSY  03/27/2021   Procedure: BIOPSY;  Surgeon: Jackquline Denmark, MD;  Location: [facility];  Service: Endoscopy;;   COLONOSCOPY N/A 02/16/2014   Procedure: COLONOSCOPY;  Surgeon: Gatha Mayer, MD;  [facility];  Service: Endoscopy;  Laterality: N/A;   COLONOSCOPY     ENDOSCOPIC RETROGRADE CHOLANGIOPANCREATOGRAPHY (ERCP) WITH PROPOFOL N/A 02/27/2021   Procedure: ENDOSCOPIC RETROGRADE CHOLANGIOPANCREATOGRAPHY (ERCP) WITH PROPOFOL;  Surgeon: Milus Banister, MD;  [facility]: [facility];  Service: Endoscopy;  Laterality: N/A;   ERCP N/A 03/27/2021   Procedure: ENDOSCOPIC RETROGRADE CHOLANGIOPANCREATOGRAPHY (ERCP);  Surgeon: Jackquline Denmark, MD;  Location: [facility];  Service: Endoscopy;  Laterality: N/A;   ESOPHAGOGASTRODUODENOSCOPY (EGD) WITH PROPOFOL N/A 05/06/2021   Procedure: ESOPHAGOGASTRODUODENOSCOPY (EGD) WITH PROPOFOL;  Surgeon: Mauri Pole, MD;  [facility];  Service: Endoscopy;  Laterality: N/A;   EUS N/A 02/27/2021   Procedure: UPPER ENDOSCOPIC ULTRASOUND (EUS) RADIAL;  Surgeon: Milus Banister, MD;  [facility]: [facility];  Service: Endoscopy;  Laterality: N/A;  FINE NEEDLE ASPIRATION N/A 02/27/2021   Procedure: FINE NEEDLE ASPIRATION (FNA) LINEAR;  Surgeon: Milus Banister, MD;  [facility];  Service: Endoscopy;  Laterality: N/A;   IR IMAGING GUIDED PORT INSERTION  03/21/2021    SPHINCTEROTOMY  02/27/2021   Procedure: SPHINCTEROTOMY;  Surgeon: Milus Banister, MD;  [facility];  Service: Endoscopy;;   STENT REMOVAL  03/27/2021   Procedure: STENT REMOVAL;  Surgeon: Jackquline Denmark, MD;  [facility];  Service: Endoscopy;;   TONSILLECTOMY      Current Medications: Current Meds  Medication Sig   Accu-Chek Softclix Lancets lancets Check blood glucose level by fingerstick 3-4 times per day.   acetaminophen (TYLENOL) 500 MG tablet Take 500 mg by mouth every 6 (six) hours as needed for moderate pain.   albuterol (VENTOLIN HFA) 108 (90 Base) MCG/ACT inhaler Inhale 2 puffs into the lungs every 6 (six) hours as needed for wheezing or shortness of breath.   aspirin 81 MG EC tablet Take 1 tablet by mouth daily.   atorvastatin (LIPITOR) 20 MG tablet Take 1 tablet (20 mg total) by mouth daily.   Blood Glucose Monitoring Suppl (ACCU-CHEK GUIDE) w/Device KIT Check blood glucose level by fingerstick 3-4 times per day.   capecitabine (XELODA) 500 MG tablet Take 3 tablets (1,500 mg total) by mouth 2 (two) times daily after a meal. Take only on days of radiation Monday through Friday.   chlorhexidine (PERIDEX) 0.12 % solution Rinse between teeth using irrigation syringe (15 mL) 2-3 times a day or as needed for gum irritation. (Patient taking differently: Use as directed 5 mLs in the mouth or throat daily as needed (for gum irriation).)   Continuous Blood Gluc Receiver (FREESTYLE LIBRE 2 READER) DEVI Use as instructed. Check blood glucose level by fingerstick 4 - 5 per day.   Continuous Blood Gluc Sensor (FREESTYLE LIBRE 2 SENSOR) MISC Use as directed.   diphenoxylate-atropine (LOMOTIL) 2.5-0.025 MG tablet Take 1 tablet by mouth 4 (four) times daily as needed for diarrhea or loose stools.   doxycycline (VIBRA-TABS) 100 MG tablet Take 1 tablet by mouth twice daily for 10 days.   DULoxetine (CYMBALTA) 60 MG capsule Take 1 capsule (60 mg total) by mouth daily.   EPINEPHrine  0.3 mg/0.3 mL IJ SOAJ injection Inject 0.3 mg into the muscle once as needed for anaphylaxis.   folic acid (FOLVITE) 1 MG tablet Take 1 tablet (1 mg total) by mouth in the morning.   glucose blood (ACCU-CHEK GUIDE) test strip Check blood glucose level by fingerstick 3 - 4 times per day.   insulin degludec (TRESIBA FLEXTOUCH) 100 UNIT/ML FlexTouch Pen Inject 24 Units into the skin daily.   Insulin Lispro-aabc (LYUMJEV KWIKPEN) 100 UNIT/ML KwikPen Inject 5-8 Units into the skin 3 (three) times daily before meals.   Insulin Pen Needle (TRUEPLUS PEN NEEDLES) 32G X 4 MM MISC Use as instructed. Inject into the skin twice daily   Insulin Pen Needle 32G X 4 MM MISC Use as directed with humulin kwikpen.   lidocaine-prilocaine (EMLA) cream Apply 1 application topically as directed as needed.   lipase/protease/amylase (CREON) 12000-38000 units CPEP capsule Take 36,000-72,000 Units by mouth See admin instructions. Take 2 capsules by mouth daily with meals and may take 1 capsule by mouth as needed with snakes.   mirtazapine (REMERON) 30 MG tablet Take 1 tablet (30 mg total) by mouth at bedtime.   Multiple Vitamins-Minerals (THEREMS-M) TABS Take 1 tablet by mouth in the morning.  mupirocin ointment (BACTROBAN) 2 % Apply to wound surface with each dressing change.   nicotine (NICODERM CQ - DOSED IN MG/24 HOURS) 14 mg/24hr patch Apply one patch daily x 12 weeks.   ondansetron (ZOFRAN) 8 MG tablet Take 1 tablet (8 mg total) by mouth 2 (two) times daily as needed. Start on day 3 after chemotherapy.   oxyCODONE (OXY IR/ROXICODONE) 5 MG immediate release tablet Take 1 to 2 tablets by mouth every 6 hours as needed for severe pain.   oxyCODONE ER (XTAMPZA ER) 9 MG C12A Take 1 capsule by mouth every 12 hours.   pantoprazole (PROTONIX) 40 MG tablet Take 1 tablet (40 mg total) by mouth daily.   polyethylene glycol powder (GLYCOLAX/MIRALAX) 17 GM/SCOOP powder Take 17 g by mouth at bedtime.   prochlorperazine (COMPAZINE) 10  MG tablet Take 1 tablet (10 mg total) by mouth every 6 (six) hours as needed for nausea or vomiting.   varenicline (APO-VARENICLINE) 1 MG tablet Take 1 tablet by mouth once daily in the morning with food   zolpidem (AMBIEN) 5 MG tablet Take 1 tablet (5 mg total) by mouth at bedtime as needed for sleep.     Allergies:   Bee venom and Lactose intolerance (gi)   Social History   Socioeconomic History   Marital status: Single    Spouse name: Not on file   Number of children: Not on file   Years of education: Not on file   Highest education level: Not on file  Occupational History   Not on file  Tobacco Use   Smoking status: Every Day    Packs/day: 0.50    Years: 35.00    Total pack years: 17.50    Types: Cigarettes   Smokeless tobacco: Never   Tobacco comments:    Currently being followed by the smoking cessation program   Vaping Use   Vaping Use: Never used  Substance and Sexual Activity   Alcohol use: Not Currently    Comment: previous   Drug use: No   Sexual activity: Not on file  Other Topics Concern   Not on file  Social History Narrative   Not on file   Social Determinants of Health   Financial Resource Strain: High Risk (03/13/2021)   Overall Financial Resource Strain (CARDIA)    Difficulty of Paying Living Expenses: Very hard  Food Insecurity: Food Insecurity Present (05/15/2021)   Hunger Vital Sign    Worried About Running Out of Food in the Last Year: Sometimes true    Ran Out of Food in the Last Year: Sometimes true  Transportation Needs: Unmet Transportation Needs (03/13/2021)   PRAPARE - Hydrologist (Medical): Yes    Lack of Transportation (Non-Medical): Yes  Physical Activity: Not on file  Stress: Not on file  Social Connections: Not on file     Family History: The patient's family history includes Alcohol abuse in his father; Breast cancer in his mother; Diabetes in his paternal grandfather; Heart disease in his maternal  grandfather; Pancreatic cancer in his paternal grandmother; Stroke in his father. There is no history of Colon cancer, Stomach cancer, Rectal cancer, or Esophageal cancer.    ROS:   Please see the history of present illness.    All other systems reviewed and are negative.   EKGs/Labs/Other Studies Reviewed:    The following studies were reviewed today: As summarized above  EKG:  EKG is  not ordered today.   Recent Labs: 05/12/2021:  Magnesium 1.3 01/05/2022: B Natriuretic Peptide 54.1 02/02/2022: ALT 11; BUN 8; Creatinine 0.43; Hemoglobin 11.2; Platelet Count 408; Potassium 3.6; Sodium 140 02/04/2022: TSH 2.190  Recent Lipid Panel    Component Value Date/Time   CHOL 174 04/28/2021 0936   TRIG 78 04/28/2021 0936   HDL 70 04/28/2021 0936   CHOLHDL 2.5 04/28/2021 0936   CHOLHDL 4.1 08/18/2016 1459   VLDL 30 08/18/2016 1459   LDLCALC 89 04/28/2021 0936     Physical Exam:    VS:  BP 110/70 (BP Location: Left Arm, Patient Position: Sitting, Cuff Size: Normal)   Pulse 90   Ht '5\' 11"'  (1.803 m)   Wt 124 lb (56.2 kg)   SpO2 99%   BMI 17.29 kg/m     Wt Readings from Last 3 Encounters:  02/05/22 124 lb (56.2 kg)  02/04/22 126 lb (57.2 kg)  02/02/22 125 lb 14.4 oz (57.1 kg)     GEN:  Well nourished, well developed in no acute distress HEENT: Normal NECK: No JVD; No carotid bruits LYMPHATICS: No lymphadenopathy CARDIAC: RRR, no murmurs, rubs, gallops RESPIRATORY:  Clear to auscultation without rales, wheezing or rhonchi  ABDOMEN: Soft, non-tender, non-distended MUSCULOSKELETAL:  No edema; No deformity  SKIN: Warm and dry NEUROLOGIC:  Alert and oriented x 3 PSYCHIATRIC:  Normal affect   ASSESSMENT AND PLAN:    Pericardial effusion  - Limited echo post discharge 5/15 showed mild to moderate pericardial effusion. No tamponade. No change from prior.  Asymptomatic.  No need for further echocardiogram.  2. Pancreatic adenocarcincoma 3. Intraabdominal abscess -Recovering  well  4.  Aortic atherosclerosis -Continue aspirin and statin  5. HTN - BP stable   Medication Adjustments/Labs and Tests Ordered: Current medicines are reviewed at length with the patient today.  Concerns regarding medicines are outlined above.  No orders of the defined types were placed in this encounter.  No orders of the defined types were placed in this encounter.   Patient Instructions  Medication Instructions:  Your physician recommends that you continue on your current medications as directed. Please refer to the Current Medication list given to you today. *If you need a refill on your cardiac medications before your next appointment, please call your pharmacy*   Lab Work: None Ordered If you have labs (blood work) drawn today and your tests are completely normal, you will receive your results only by: [place] (if you have MyChart) OR A paper copy in the mail If you have any lab test that is abnormal or we need to change your treatment, we will call you to review the results.   Testing/Procedures: None Ordered   Follow-Up: At [facility], you and your health needs are our priority.  As part of our continuing mission to provide you with exceptional heart care, we have created designated Provider Care Teams.  These Care Teams include your primary Cardiologist (physician) and Advanced Practice Providers (APPs -  Physician Assistants and Nurse Practitioners) who all work together to provide you with the care you need, when you need it.  We recommend signing up for the patient portal called "MyChart".  Sign up information is provided on this After Visit Summary.  MyChart is used to connect with patients for Virtual Visits (Telemedicine).  Patients are able to view lab/test results, encounter notes, upcoming appointments, etc.  Non-urgent messages can be sent to your provider as well.   To learn more about what you can do with MyChart, go to [email or web address].  Your next appointment:   6 month(s)  The format for your next appointment:   In Person  Provider:   Werner Lean, MD     Other Instructions   Important Information About Sugar         Jarrett Soho, [place]  02/05/2022 3:31 PM    [place]

## 2022-02-05 NOTE — Patient Instructions (Signed)
Medication Instructions:  Your physician recommends that you continue on your current medications as directed. Please refer to the Current Medication list given to you today. *If you need a refill on your cardiac medications before your next appointment, please call your pharmacy*   Lab Work: None Ordered If you have labs (blood work) drawn today and your tests are completely normal, you will receive your results only by: [place] (if you have MyChart) OR A paper copy in the mail If you have any lab test that is abnormal or we need to change your treatment, we will call you to review the results.   Testing/Procedures: None Ordered   Follow-Up: At [facility], you and your health needs are our priority.  As part of our continuing mission to provide you with exceptional heart care, we have created designated Provider Care Teams.  These Care Teams include your primary Cardiologist (physician) and Advanced Practice Providers (APPs -  Physician Assistants and Nurse Practitioners) who all work together to provide you with the care you need, when you need it.  We recommend signing up for the patient portal called "MyChart".  Sign up information is provided on this After Visit Summary.  MyChart is used to connect with patients for Virtual Visits (Telemedicine).  Patients are able to view lab/test results, encounter notes, upcoming appointments, etc.  Non-urgent messages can be sent to your provider as well.   To learn more about what you can do with MyChart, go to [email or web address].    Your next appointment:   6 month(s)  The format for your next appointment:   In Person  Provider:   Werner Lean, MD     Other Instructions   Important Information About Sugar

## 2022-02-06 ENCOUNTER — Other Ambulatory Visit (HOSPITAL_COMMUNITY): Payer: Self-pay

## 2022-02-09 ENCOUNTER — Encounter: Payer: Self-pay | Admitting: Hematology and Oncology

## 2022-02-09 ENCOUNTER — Other Ambulatory Visit (HOSPITAL_COMMUNITY): Payer: Self-pay

## 2022-02-09 ENCOUNTER — Other Ambulatory Visit: Payer: Self-pay

## 2022-02-09 ENCOUNTER — Telehealth: Payer: Self-pay

## 2022-02-09 ENCOUNTER — Ambulatory Visit
Admission: RE | Admit: 2022-02-09 | Discharge: 2022-02-09 | Disposition: A | Discharge status: Home / Self Care | Payer: Medicaid Other | Source: Ambulatory Visit | Attending: Radiation Oncology | Admitting: Radiation Oncology

## 2022-02-09 ENCOUNTER — Encounter (HOSPITAL_BASED_OUTPATIENT_CLINIC_OR_DEPARTMENT_OTHER): Payer: Medicaid Other | Admitting: General Surgery

## 2022-02-09 VITALS — BP 129/80 | HR 106 | Temp 96.5°F | Resp 18 | Ht 71.0 in | Wt 125.5 lb

## 2022-02-09 DIAGNOSIS — Z510 Encounter for antineoplastic radiation therapy: Secondary | ICD-10-CM | POA: Insufficient documentation

## 2022-02-09 DIAGNOSIS — C250 Malignant neoplasm of head of pancreas: Secondary | ICD-10-CM | POA: Insufficient documentation

## 2022-02-09 DIAGNOSIS — E11621 Type 2 diabetes mellitus with foot ulcer: Secondary | ICD-10-CM | POA: Diagnosis not present

## 2022-02-09 MED ORDER — HEPARIN SOD (PORK) LOCK FLUSH 100 UNIT/ML IV SOLN
500.0000 [IU] | Freq: Once | INTRAVENOUS | Status: AC
  Administered 2022-02-09: 500 [IU] via INTRAVENOUS

## 2022-02-09 MED ORDER — SODIUM CHLORIDE 0.9% FLUSH
10.0000 mL | Freq: Once | INTRAVENOUS | Status: AC
  Administered 2022-02-09: 10 mL via INTRAVENOUS
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
## 2022-02-09 NOTE — Telephone Encounter (Addendum)
Referral received for home health SN/PT.  Call placed to the patient to inquire if he has a preference for home health agencies and he has no preference.  I explained to him that I will contact local home health agencies but there is no guarantee that we will be able to secure an agency because the agency must be in network with his insurance and have staffing available to accept the referral. He said he understood.  Referral faxed to [facility] for review

## 2022-02-09 NOTE — Progress Notes (Addendum)
Has armband been applied?  Yes.    Does patient have an allergy to IV contrast dye?: No.   Has patient ever received premedication for IV contrast dye?: n/a   Does patient take metformin?: No  If patient does take metformin when was the last dose: n/a  Date of lab work: 02/02/2022 BUN: 8 CR: 0.43 eGfr: >60   IV site: Right Chest Port  Has IV site been added to flowsheet?  Yes.    BP 129/80 (BP Location: Left Arm, Patient Position: Sitting)   Pulse (!) 106   Temp (!) 96.5 F (35.8 C) (Temporal)   Resp 18   Ht 5' 11" (1.803 m)   Wt 125 lb 8 oz (56.9 kg)   SpO2 100%   BMI 17.50 kg/m    LaToya M. Silva RN, BSN  

## 2022-02-10 NOTE — Telephone Encounter (Signed)
Call placed to [facility], I spoek to [place] who said they declined the referral.  Referral then sent to [facility] and [facility] for review.

## 2022-02-11 ENCOUNTER — Other Ambulatory Visit (HOSPITAL_COMMUNITY): Payer: Self-pay

## 2022-02-12 DIAGNOSIS — Z510 Encounter for antineoplastic radiation therapy: Secondary | ICD-10-CM | POA: Diagnosis not present

## 2022-02-12 NOTE — Telephone Encounter (Signed)
Message received from [place] [facility] stating they declined the referral.  Called placed to [facility], spoke to [place] who said they declined the referral.   Call placed to [facility], spoke to [place] who requested referral be faxed for review.  They are in network with patient's insurance.

## 2022-02-13 NOTE — Telephone Encounter (Signed)
[name] [name] w/ [facility] states they are unable to accept referral for patient due to no availability

## 2022-02-16 ENCOUNTER — Ambulatory Visit: Payer: Medicaid Other | Admitting: Radiation Oncology

## 2022-02-16 ENCOUNTER — Encounter: Payer: Self-pay | Admitting: Hematology and Oncology

## 2022-02-16 ENCOUNTER — Other Ambulatory Visit: Payer: Self-pay | Admitting: Physician Assistant

## 2022-02-16 ENCOUNTER — Other Ambulatory Visit (HOSPITAL_COMMUNITY): Payer: Self-pay

## 2022-02-16 ENCOUNTER — Ambulatory Visit: Payer: Medicaid Other

## 2022-02-16 ENCOUNTER — Telehealth: Payer: Self-pay | Admitting: Hematology and Oncology

## 2022-02-16 ENCOUNTER — Other Ambulatory Visit: Payer: Self-pay | Admitting: Hematology and Oncology

## 2022-02-16 DIAGNOSIS — C250 Malignant neoplasm of head of pancreas: Secondary | ICD-10-CM

## 2022-02-16 MED ORDER — LIDOCAINE-PRILOCAINE 2.5-2.5 % EX CREA
1.0000 | TOPICAL_CREAM | CUTANEOUS | 0 refills | Status: DC | PRN
  Filled 2022-02-16: qty 30, 30d supply, fill #0

## 2022-02-16 MED ORDER — OXYCODONE HCL 5 MG PO TABS
5.0000 mg | ORAL_TABLET | Freq: Four times a day (QID) | ORAL | 0 refills | Status: DC | PRN
  Filled 2022-02-16: qty 120, 15d supply, fill #0

## 2022-02-16 NOTE — Telephone Encounter (Signed)
Scheduled per 6/19 in basket, message has been left with pt 

## 2022-02-17 ENCOUNTER — Ambulatory Visit: Payer: Medicaid Other | Admitting: Radiation Oncology

## 2022-02-17 ENCOUNTER — Other Ambulatory Visit (HOSPITAL_COMMUNITY): Payer: Self-pay

## 2022-02-17 ENCOUNTER — Other Ambulatory Visit: Payer: Self-pay | Admitting: Nurse Practitioner

## 2022-02-17 DIAGNOSIS — E1142 Type 2 diabetes mellitus with diabetic polyneuropathy: Secondary | ICD-10-CM

## 2022-02-17 MED ORDER — DULOXETINE HCL 60 MG PO CPEP
60.0000 mg | ORAL_CAPSULE | Freq: Every day | ORAL | 1 refills | Status: DC
  Filled 2022-02-17: qty 90, 90d supply, fill #0
  Filled 2022-03-23 – 2022-06-29 (×2): qty 90, 90d supply, fill #1

## 2022-02-17 NOTE — Telephone Encounter (Signed)
Call placed to [facility], spoke to [place] who requested referral be faxed to [phone] for review.   I also called [facility] and spoke to Mariann Laster who also requested referral be sent to  [phone] for review.    Referral was then faxed to both agencies as requested.

## 2022-02-18 ENCOUNTER — Other Ambulatory Visit: Payer: Self-pay

## 2022-02-18 ENCOUNTER — Other Ambulatory Visit (HOSPITAL_COMMUNITY): Payer: Self-pay

## 2022-02-18 ENCOUNTER — Ambulatory Visit
Admission: RE | Admit: 2022-02-18 | Discharge: 2022-02-18 | Disposition: A | Discharge status: Home / Self Care | Payer: Medicaid Other | Source: Ambulatory Visit | Attending: Radiation Oncology | Admitting: Radiation Oncology

## 2022-02-18 DIAGNOSIS — Z510 Encounter for antineoplastic radiation therapy: Secondary | ICD-10-CM | POA: Diagnosis not present

## 2022-02-18 LAB — RAD ONC ARIA SESSION SUMMARY
Course Elapsed Days: 0
Plan Fractions Treated to Date: 1
Plan Prescribed Dose Per Fraction: 1.8 Gy
Plan Total Fractions Prescribed: 25
Plan Total Prescribed Dose: 45 Gy
Reference Point Dosage Given to Date: 1.8 Gy
Reference Point Session Dosage Given: 1.8 Gy
Session Number: 1

## 2022-02-18 NOTE — Telephone Encounter (Signed)
Call received from Tammy/ [facility] stating that they cannot accept the referral even though they are in network with his insurance. She stated that the following agencies are also in network with his insurance: [facility] and [facility] who both already declined the referral and [facility] # [phone].

## 2022-02-19 ENCOUNTER — Other Ambulatory Visit: Payer: Self-pay

## 2022-02-19 ENCOUNTER — Ambulatory Visit
Admission: RE | Admit: 2022-02-19 | Discharge: 2022-02-19 | Disposition: A | Discharge status: Home / Self Care | Payer: Medicaid Other | Source: Ambulatory Visit | Attending: Radiation Oncology | Admitting: Radiation Oncology

## 2022-02-19 ENCOUNTER — Other Ambulatory Visit (HOSPITAL_COMMUNITY): Payer: Self-pay

## 2022-02-19 DIAGNOSIS — Z510 Encounter for antineoplastic radiation therapy: Secondary | ICD-10-CM | POA: Diagnosis not present

## 2022-02-19 LAB — RAD ONC ARIA SESSION SUMMARY
Course Elapsed Days: 1
Plan Fractions Treated to Date: 2
Plan Prescribed Dose Per Fraction: 1.8 Gy
Plan Total Fractions Prescribed: 25
Plan Total Prescribed Dose: 45 Gy
Reference Point Dosage Given to Date: 3.6 Gy
Reference Point Session Dosage Given: 1.8 Gy
Session Number: 2

## 2022-02-19 NOTE — Telephone Encounter (Signed)
I called [facility] # [phone] and spoke to [name] who said that they don't provide skilled nursing and PT, only personal care services.   I called [facility], spoke to Santiago Glad who said they are not accepting the referral.   I called [facility], spoke to [facility] who said that they are in network with [employer] and she requested the referral be faxed for review.  It was then faxed to [phone].

## 2022-02-20 ENCOUNTER — Encounter: Payer: Self-pay | Admitting: Nurse Practitioner

## 2022-02-20 ENCOUNTER — Ambulatory Visit
Admission: RE | Admit: 2022-02-20 | Discharge: 2022-02-20 | Disposition: A | Discharge status: Home / Self Care | Payer: Medicaid Other | Source: Ambulatory Visit | Attending: Radiation Oncology | Admitting: Radiation Oncology

## 2022-02-20 ENCOUNTER — Other Ambulatory Visit: Payer: Self-pay

## 2022-02-20 ENCOUNTER — Telehealth: Payer: Self-pay | Admitting: Nurse Practitioner

## 2022-02-20 ENCOUNTER — Ambulatory Visit: Payer: Medicaid Other | Admitting: Radiation Oncology

## 2022-02-20 DIAGNOSIS — Z510 Encounter for antineoplastic radiation therapy: Secondary | ICD-10-CM | POA: Diagnosis not present

## 2022-02-20 LAB — RAD ONC ARIA SESSION SUMMARY
Course Elapsed Days: 2
Plan Fractions Treated to Date: 3
Plan Prescribed Dose Per Fraction: 1.8 Gy
Plan Total Fractions Prescribed: 25
Plan Total Prescribed Dose: 45 Gy
Reference Point Dosage Given to Date: 5.4 Gy
Reference Point Session Dosage Given: 1.8 Gy
Session Number: 3

## 2022-02-21 ENCOUNTER — Other Ambulatory Visit: Payer: Self-pay | Admitting: Hematology and Oncology

## 2022-02-21 ENCOUNTER — Other Ambulatory Visit (HOSPITAL_COMMUNITY): Payer: Self-pay

## 2022-02-21 DIAGNOSIS — F17200 Nicotine dependence, unspecified, uncomplicated: Secondary | ICD-10-CM

## 2022-02-21 MED ORDER — ALBUTEROL SULFATE HFA 108 (90 BASE) MCG/ACT IN AERS
INHALATION_SPRAY | RESPIRATORY_TRACT | 0 refills | Status: DC
  Filled 2022-02-21: qty 18, 25d supply, fill #0

## 2022-02-22 ENCOUNTER — Other Ambulatory Visit: Payer: Self-pay | Admitting: Hematology and Oncology

## 2022-02-22 DIAGNOSIS — C250 Malignant neoplasm of head of pancreas: Secondary | ICD-10-CM

## 2022-02-23 ENCOUNTER — Inpatient Hospital Stay: Payer: Medicaid Other

## 2022-02-23 ENCOUNTER — Encounter (HOSPITAL_BASED_OUTPATIENT_CLINIC_OR_DEPARTMENT_OTHER): Payer: Medicaid Other | Admitting: General Surgery

## 2022-02-23 ENCOUNTER — Ambulatory Visit
Admission: RE | Admit: 2022-02-23 | Discharge: 2022-02-23 | Disposition: A | Discharge status: Home / Self Care | Payer: Medicaid Other | Source: Ambulatory Visit | Attending: Radiation Oncology | Admitting: Radiation Oncology

## 2022-02-23 ENCOUNTER — Other Ambulatory Visit: Payer: Self-pay

## 2022-02-23 ENCOUNTER — Other Ambulatory Visit (HOSPITAL_COMMUNITY): Payer: Self-pay

## 2022-02-23 ENCOUNTER — Inpatient Hospital Stay (HOSPITAL_BASED_OUTPATIENT_CLINIC_OR_DEPARTMENT_OTHER): Payer: Medicaid Other | Admitting: Hematology and Oncology

## 2022-02-23 VITALS — BP 109/72 | HR 107 | Temp 97.9°F | Resp 16 | Wt 124.6 lb

## 2022-02-23 DIAGNOSIS — D72825 Bandemia: Secondary | ICD-10-CM

## 2022-02-23 DIAGNOSIS — C250 Malignant neoplasm of head of pancreas: Secondary | ICD-10-CM

## 2022-02-23 DIAGNOSIS — Z95828 Presence of other vascular implants and grafts: Secondary | ICD-10-CM | POA: Diagnosis not present

## 2022-02-23 DIAGNOSIS — Z510 Encounter for antineoplastic radiation therapy: Secondary | ICD-10-CM | POA: Diagnosis not present

## 2022-02-23 DIAGNOSIS — E11621 Type 2 diabetes mellitus with foot ulcer: Secondary | ICD-10-CM | POA: Diagnosis not present

## 2022-02-23 LAB — CMP (CANCER CENTER ONLY)
ALT: 12 U/L (ref 0–44)
AST: 15 U/L (ref 15–41)
Albumin: 3.4 g/dL — ABNORMAL LOW (ref 3.5–5.0)
Alkaline Phosphatase: 183 U/L — ABNORMAL HIGH (ref 38–126)
Anion gap: 4 — ABNORMAL LOW (ref 5–15)
BUN: 10 mg/dL (ref 6–20)
CO2: 28 mmol/L (ref 22–32)
Calcium: 9.1 mg/dL (ref 8.9–10.3)
Chloride: 97 mmol/L — ABNORMAL LOW (ref 98–111)
Creatinine: 0.53 mg/dL — ABNORMAL LOW (ref 0.61–1.24)
GFR, Estimated: >60 mL/min (ref >60)
Glucose, Bld: 378 mg/dL — ABNORMAL HIGH (ref 70–99)
Potassium: 4.1 mmol/L (ref 3.5–5.1)
Sodium: 129 mmol/L — ABNORMAL LOW (ref 135–145)
Total Bilirubin: 0.4 mg/dL (ref 0.3–1.2)
Total Protein: 7.5 g/dL (ref 6.5–8.1)

## 2022-02-23 LAB — RAD ONC ARIA SESSION SUMMARY
Course Elapsed Days: 5
Plan Fractions Treated to Date: 4
Plan Prescribed Dose Per Fraction: 1.8 Gy
Plan Total Fractions Prescribed: 25
Plan Total Prescribed Dose: 45 Gy
Reference Point Dosage Given to Date: 7.2 Gy
Reference Point Session Dosage Given: 1.8 Gy
Session Number: 4

## 2022-02-23 LAB — CBC WITH DIFFERENTIAL (CANCER CENTER ONLY)
Abs Immature Granulocytes: 0.02 10*3/uL (ref 0.00–0.07)
Basophils Absolute: 0 10*3/uL (ref 0.0–0.1)
Basophils Relative: 1 %
Eosinophils Absolute: 0.1 10*3/uL (ref 0.0–0.5)
Eosinophils Relative: 1 %
HCT: 30.4 % — ABNORMAL LOW (ref 39.0–52.0)
Hemoglobin: 10 g/dL — ABNORMAL LOW (ref 13.0–17.0)
Immature Granulocytes: 0 %
Lymphocytes Relative: 15 %
Lymphs Abs: 1.2 10*3/uL (ref 0.7–4.0)
MCH: 27.3 pg (ref 26.0–34.0)
MCHC: 32.9 g/dL (ref 30.0–36.0)
MCV: 83.1 fL (ref 80.0–100.0)
Monocytes Absolute: 0.8 10*3/uL (ref 0.1–1.0)
Monocytes Relative: 9 %
Neutro Abs: 6.3 10*3/uL (ref 1.7–7.7)
Neutrophils Relative %: 74 %
Platelet Count: 268 10*3/uL (ref 150–400)
RBC: 3.66 MIL/uL — ABNORMAL LOW (ref 4.22–5.81)
RDW: 17.4 % — ABNORMAL HIGH (ref 11.5–15.5)
WBC Count: 8.4 10*3/uL (ref 4.0–10.5)
nRBC: 0 % (ref 0.0–0.2)

## 2022-02-23 MED ORDER — HEPARIN SOD (PORK) LOCK FLUSH 100 UNIT/ML IV SOLN
500.0000 [IU] | Freq: Once | INTRAVENOUS | Status: AC
  Administered 2022-02-23: 500 [IU]

## 2022-02-23 MED ORDER — SODIUM CHLORIDE 0.9% FLUSH
10.0000 mL | Freq: Once | INTRAVENOUS | Status: AC
  Administered 2022-02-23: 10 mL

## 2022-02-23 NOTE — Telephone Encounter (Signed)
noted 

## 2022-02-24 ENCOUNTER — Other Ambulatory Visit (HOSPITAL_COMMUNITY): Payer: Self-pay

## 2022-02-24 ENCOUNTER — Telehealth: Payer: Self-pay | Admitting: Hematology and Oncology

## 2022-02-24 ENCOUNTER — Ambulatory Visit
Admission: RE | Admit: 2022-02-24 | Discharge: 2022-02-24 | Disposition: A | Discharge status: Home / Self Care | Payer: Medicaid Other | Source: Ambulatory Visit | Attending: Radiation Oncology | Admitting: Radiation Oncology

## 2022-02-24 ENCOUNTER — Other Ambulatory Visit: Payer: Self-pay

## 2022-02-24 DIAGNOSIS — Z510 Encounter for antineoplastic radiation therapy: Secondary | ICD-10-CM | POA: Diagnosis not present

## 2022-02-24 LAB — RAD ONC ARIA SESSION SUMMARY
Course Elapsed Days: 6
Plan Fractions Treated to Date: 5
Plan Prescribed Dose Per Fraction: 1.8 Gy
Plan Total Fractions Prescribed: 25
Plan Total Prescribed Dose: 45 Gy
Reference Point Dosage Given to Date: 9 Gy
Reference Point Session Dosage Given: 1.8 Gy
Session Number: 5

## 2022-02-24 NOTE — Telephone Encounter (Signed)
Per 6/26 los called and spoke to pt about appointment.  Pt confirmed appointment

## 2022-02-25 ENCOUNTER — Other Ambulatory Visit (HOSPITAL_COMMUNITY): Payer: Self-pay

## 2022-02-25 ENCOUNTER — Ambulatory Visit: Payer: Medicaid Other

## 2022-02-25 NOTE — Telephone Encounter (Signed)
Message received from [facility] stating that they are not in network with [employer]

## 2022-02-25 NOTE — Telephone Encounter (Signed)
I called [facility] # [phone], spoke to [facility] who said they are not able to accept the referral.  I called [facility] # [phone]  and spoke to [place] who was not sure if they are in network with [employer] and requested the referral be faxed to Intake.  Referral then faxed for review # [phone].   Message sent to Laurina Bustle, [place] inquiring if they are in network with [employer]

## 2022-02-26 ENCOUNTER — Other Ambulatory Visit: Payer: Self-pay

## 2022-02-26 ENCOUNTER — Ambulatory Visit
Admission: RE | Admit: 2022-02-26 | Discharge: 2022-02-26 | Disposition: A | Discharge status: Home / Self Care | Payer: Medicaid Other | Source: Ambulatory Visit | Attending: Radiation Oncology | Admitting: Radiation Oncology

## 2022-02-26 DIAGNOSIS — Z510 Encounter for antineoplastic radiation therapy: Secondary | ICD-10-CM | POA: Diagnosis not present

## 2022-02-26 LAB — RAD ONC ARIA SESSION SUMMARY
Course Elapsed Days: 8
Plan Fractions Treated to Date: 6
Plan Prescribed Dose Per Fraction: 1.8 Gy
Plan Total Fractions Prescribed: 25
Plan Total Prescribed Dose: 45 Gy
Reference Point Dosage Given to Date: 10.8 Gy
Reference Point Session Dosage Given: 1.8 Gy
Session Number: 6

## 2022-02-27 ENCOUNTER — Other Ambulatory Visit: Payer: Self-pay | Admitting: Nurse Practitioner

## 2022-02-27 ENCOUNTER — Ambulatory Visit
Admission: RE | Admit: 2022-02-27 | Discharge: 2022-02-27 | Disposition: A | Discharge status: Home / Self Care | Payer: Medicaid Other | Source: Ambulatory Visit | Attending: Radiation Oncology | Admitting: Radiation Oncology

## 2022-02-27 ENCOUNTER — Other Ambulatory Visit (HOSPITAL_COMMUNITY): Payer: Self-pay

## 2022-02-27 ENCOUNTER — Other Ambulatory Visit: Payer: Self-pay

## 2022-02-27 DIAGNOSIS — F5101 Primary insomnia: Secondary | ICD-10-CM

## 2022-02-27 DIAGNOSIS — Z510 Encounter for antineoplastic radiation therapy: Secondary | ICD-10-CM | POA: Diagnosis not present

## 2022-02-27 LAB — RAD ONC ARIA SESSION SUMMARY
Course Elapsed Days: 9
Plan Fractions Treated to Date: 7
Plan Prescribed Dose Per Fraction: 1.8 Gy
Plan Total Fractions Prescribed: 25
Plan Total Prescribed Dose: 45 Gy
Reference Point Dosage Given to Date: 12.6 Gy
Reference Point Session Dosage Given: 1.8 Gy
Session Number: 7

## 2022-02-27 MED ORDER — MUPIROCIN 2 % EX OINT
TOPICAL_OINTMENT | CUTANEOUS | 0 refills | Status: DC
  Filled 2022-02-27: qty 22, 10d supply, fill #0

## 2022-02-27 MED ORDER — ZOLPIDEM TARTRATE 10 MG PO TABS
10.0000 mg | ORAL_TABLET | Freq: Every evening | ORAL | 1 refills | Status: DC | PRN
  Filled 2022-02-27: qty 15, 30d supply, fill #0
  Filled 2022-03-23 – 2022-03-24 (×2): qty 15, 30d supply, fill #1
  Filled 2022-03-25 – 2022-04-23 (×4): qty 15, 30d supply, fill #2
  Filled 2022-05-06 – 2022-05-08 (×3): qty 15, 30d supply, fill #3
  Filled 2022-05-12: qty 15, 15d supply, fill #3

## 2022-03-02 ENCOUNTER — Other Ambulatory Visit: Payer: Self-pay

## 2022-03-02 ENCOUNTER — Other Ambulatory Visit (HOSPITAL_COMMUNITY): Payer: Self-pay

## 2022-03-02 ENCOUNTER — Ambulatory Visit
Admission: RE | Admit: 2022-03-02 | Discharge: 2022-03-02 | Disposition: A | Discharge status: Home / Self Care | Payer: Medicaid Other | Source: Ambulatory Visit | Attending: Radiation Oncology | Admitting: Radiation Oncology

## 2022-03-02 DIAGNOSIS — Z510 Encounter for antineoplastic radiation therapy: Secondary | ICD-10-CM | POA: Insufficient documentation

## 2022-03-02 DIAGNOSIS — C250 Malignant neoplasm of head of pancreas: Secondary | ICD-10-CM | POA: Insufficient documentation

## 2022-03-02 LAB — RAD ONC ARIA SESSION SUMMARY
Course Elapsed Days: 12
Plan Fractions Treated to Date: 8
Plan Prescribed Dose Per Fraction: 1.8 Gy
Plan Total Fractions Prescribed: 25
Plan Total Prescribed Dose: 45 Gy
Reference Point Dosage Given to Date: 14.4 Gy
Reference Point Session Dosage Given: 1.8 Gy
Session Number: 8

## 2022-03-03 ENCOUNTER — Other Ambulatory Visit: Payer: Self-pay | Admitting: Hematology and Oncology

## 2022-03-04 ENCOUNTER — Other Ambulatory Visit: Payer: Self-pay

## 2022-03-04 ENCOUNTER — Other Ambulatory Visit (HOSPITAL_COMMUNITY): Payer: Self-pay

## 2022-03-04 ENCOUNTER — Ambulatory Visit
Admission: RE | Admit: 2022-03-04 | Discharge: 2022-03-04 | Disposition: A | Discharge status: Home / Self Care | Payer: Medicaid Other | Source: Ambulatory Visit | Attending: Radiation Oncology | Admitting: Radiation Oncology

## 2022-03-04 DIAGNOSIS — Z510 Encounter for antineoplastic radiation therapy: Secondary | ICD-10-CM | POA: Diagnosis not present

## 2022-03-04 LAB — RAD ONC ARIA SESSION SUMMARY
Course Elapsed Days: 14
Plan Fractions Treated to Date: 9
Plan Prescribed Dose Per Fraction: 1.8 Gy
Plan Total Fractions Prescribed: 25
Plan Total Prescribed Dose: 45 Gy
Reference Point Dosage Given to Date: 16.2 Gy
Reference Point Session Dosage Given: 1.8 Gy
Session Number: 9

## 2022-03-04 NOTE — Telephone Encounter (Signed)
I called [facility], spoke to [place] who said she has not seen the referral and requested it be re-faxed. Fax number confirmed and the referral was faxed again as requested.

## 2022-03-05 ENCOUNTER — Encounter: Payer: Self-pay | Admitting: Hematology and Oncology

## 2022-03-05 ENCOUNTER — Telehealth: Payer: Self-pay | Admitting: Radiation Oncology

## 2022-03-05 ENCOUNTER — Ambulatory Visit: Payer: Medicaid Other

## 2022-03-05 ENCOUNTER — Other Ambulatory Visit (HOSPITAL_COMMUNITY): Payer: Self-pay

## 2022-03-05 MED ORDER — OXYCODONE HCL 5 MG PO TABS
5.0000 mg | ORAL_TABLET | Freq: Four times a day (QID) | ORAL | 0 refills | Status: DC | PRN
  Filled 2022-03-05: qty 120, 15d supply, fill #0

## 2022-03-05 NOTE — Telephone Encounter (Signed)
Call received from [facility] who stated that they are not able to accept the referral. They are not currently in network with any Medicaid programs.  I called the patient and explained that I contacted 11 agencies and none were able to accept the referral - either not in network with his insurance or lack of staffing.  He said he understood.  I explained that if he needs personal care services, his insurance does cover that as a separate benefit.  He said he will think about it and can discuss with PCP

## 2022-03-05 NOTE — Telephone Encounter (Signed)
Patient called to cancel [place] 7/6, stated he is not feeling well. L4 notified.

## 2022-03-06 ENCOUNTER — Other Ambulatory Visit (HOSPITAL_COMMUNITY): Payer: Self-pay

## 2022-03-06 ENCOUNTER — Ambulatory Visit: Payer: Medicaid Other

## 2022-03-09 ENCOUNTER — Ambulatory Visit
Admission: RE | Admit: 2022-03-09 | Discharge: 2022-03-09 | Disposition: A | Discharge status: Home / Self Care | Payer: Medicaid Other | Source: Ambulatory Visit | Attending: Radiation Oncology | Admitting: Radiation Oncology

## 2022-03-09 ENCOUNTER — Other Ambulatory Visit: Payer: Self-pay

## 2022-03-09 ENCOUNTER — Encounter (HOSPITAL_BASED_OUTPATIENT_CLINIC_OR_DEPARTMENT_OTHER): Payer: Medicaid Other | Attending: General Surgery | Admitting: General Surgery

## 2022-03-09 DIAGNOSIS — E1142 Type 2 diabetes mellitus with diabetic polyneuropathy: Secondary | ICD-10-CM | POA: Insufficient documentation

## 2022-03-09 DIAGNOSIS — Z510 Encounter for antineoplastic radiation therapy: Secondary | ICD-10-CM | POA: Diagnosis not present

## 2022-03-09 DIAGNOSIS — E11621 Type 2 diabetes mellitus with foot ulcer: Secondary | ICD-10-CM | POA: Insufficient documentation

## 2022-03-09 DIAGNOSIS — L97528 Non-pressure chronic ulcer of other part of left foot with other specified severity: Secondary | ICD-10-CM | POA: Insufficient documentation

## 2022-03-09 LAB — RAD ONC ARIA SESSION SUMMARY
Course Elapsed Days: 19
Plan Fractions Treated to Date: 10
Plan Prescribed Dose Per Fraction: 1.8 Gy
Plan Total Fractions Prescribed: 25
Plan Total Prescribed Dose: 45 Gy
Reference Point Dosage Given to Date: 18 Gy
Reference Point Session Dosage Given: 1.8 Gy
Session Number: 10
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
## 2022-03-10 ENCOUNTER — Other Ambulatory Visit: Payer: Self-pay

## 2022-03-10 ENCOUNTER — Ambulatory Visit
Admission: RE | Admit: 2022-03-10 | Discharge: 2022-03-10 | Disposition: A | Discharge status: Home / Self Care | Payer: Medicaid Other | Source: Ambulatory Visit | Attending: Radiation Oncology | Admitting: Radiation Oncology

## 2022-03-10 ENCOUNTER — Encounter: Payer: Self-pay | Admitting: Nurse Practitioner

## 2022-03-10 DIAGNOSIS — Z510 Encounter for antineoplastic radiation therapy: Secondary | ICD-10-CM | POA: Diagnosis not present

## 2022-03-10 LAB — RAD ONC ARIA SESSION SUMMARY
Course Elapsed Days: 20
Plan Fractions Treated to Date: 11
Plan Prescribed Dose Per Fraction: 1.8 Gy
Plan Total Fractions Prescribed: 25
Plan Total Prescribed Dose: 45 Gy
Reference Point Dosage Given to Date: 19.8 Gy
Reference Point Session Dosage Given: 1.8 Gy
Session Number: 11

## 2022-03-11 ENCOUNTER — Ambulatory Visit
Admission: RE | Admit: 2022-03-11 | Discharge: 2022-03-11 | Disposition: A | Discharge status: Home / Self Care | Payer: Medicaid Other | Source: Ambulatory Visit | Attending: Radiation Oncology | Admitting: Radiation Oncology

## 2022-03-11 ENCOUNTER — Other Ambulatory Visit: Payer: Self-pay

## 2022-03-11 ENCOUNTER — Other Ambulatory Visit (HOSPITAL_COMMUNITY): Payer: Self-pay

## 2022-03-11 ENCOUNTER — Other Ambulatory Visit: Payer: Self-pay | Admitting: Hematology and Oncology

## 2022-03-11 ENCOUNTER — Ambulatory Visit: Payer: Medicaid Other | Admitting: Nurse Practitioner

## 2022-03-11 DIAGNOSIS — Z510 Encounter for antineoplastic radiation therapy: Secondary | ICD-10-CM | POA: Diagnosis not present

## 2022-03-11 LAB — RAD ONC ARIA SESSION SUMMARY
Course Elapsed Days: 21
Plan Fractions Treated to Date: 12
Plan Prescribed Dose Per Fraction: 1.8 Gy
Plan Total Fractions Prescribed: 25
Plan Total Prescribed Dose: 45 Gy
Reference Point Dosage Given to Date: 21.6 Gy
Reference Point Session Dosage Given: 1.8 Gy
Session Number: 12

## 2022-03-11 MED ORDER — XTAMPZA ER 9 MG PO C12A
9.0000 mg | EXTENDED_RELEASE_CAPSULE | Freq: Two times a day (BID) | ORAL | 0 refills | Status: DC
Start: 2022-03-11 — End: 2022-04-15
  Filled 2022-03-11: qty 60, 30d supply, fill #0

## 2022-03-12 ENCOUNTER — Other Ambulatory Visit: Payer: Self-pay

## 2022-03-12 ENCOUNTER — Ambulatory Visit
Admission: RE | Admit: 2022-03-12 | Discharge: 2022-03-12 | Disposition: A | Discharge status: Home / Self Care | Payer: Medicaid Other | Source: Ambulatory Visit | Attending: Radiation Oncology | Admitting: Radiation Oncology

## 2022-03-12 DIAGNOSIS — Z510 Encounter for antineoplastic radiation therapy: Secondary | ICD-10-CM | POA: Diagnosis not present

## 2022-03-12 LAB — RAD ONC ARIA SESSION SUMMARY
Course Elapsed Days: 22
Plan Fractions Treated to Date: 13
Plan Prescribed Dose Per Fraction: 1.8 Gy
Plan Total Fractions Prescribed: 25
Plan Total Prescribed Dose: 45 Gy
Reference Point Dosage Given to Date: 23.4 Gy
Reference Point Session Dosage Given: 1.8 Gy
Session Number: 13

## 2022-03-13 ENCOUNTER — Other Ambulatory Visit: Payer: Self-pay | Admitting: Physician Assistant

## 2022-03-13 ENCOUNTER — Other Ambulatory Visit (HOSPITAL_COMMUNITY): Payer: Self-pay

## 2022-03-13 ENCOUNTER — Telehealth: Payer: Self-pay | Admitting: *Deleted

## 2022-03-13 ENCOUNTER — Ambulatory Visit: Payer: Medicaid Other

## 2022-03-13 ENCOUNTER — Inpatient Hospital Stay: Payer: Medicaid Other | Admitting: Physician Assistant

## 2022-03-13 ENCOUNTER — Inpatient Hospital Stay: Payer: Medicaid Other

## 2022-03-13 NOTE — Telephone Encounter (Signed)
TCT patient regarding his appts for today as he did not show for labs/Clinic appt.. or radiation. Pt answered phone, sounding very sleepy. He states he forgot about appts for today. He thought his appts for Dede Query, [place] was 03/16/22  Scheduling message sent to reschedule.

## 2022-03-13 NOTE — Telephone Encounter (Signed)
Spoke with the patient regarding his missed radiation treatment today.  He states he overslept and was having a rough start to his day.  He reports his bowels have been very urgent and frequent for the last couple of days.  He is taking lomotil every morning with his chemo pill.  He was encouraged to take his lomotil as prescribed every 6 hours to see if that will help to slow his bowels down.  He was advised to drink plenty of fluids to replace all that is lost.  He verbalized understanding and is appreciative of the advice.  He asked that we cancel his radiation treatment today.  Treatment machine made aware.  Patient was advised to give [place] a call with any questions or concerns.  Gloriajean Dell. Leonie Green, BSN

## 2022-03-16 ENCOUNTER — Ambulatory Visit
Admission: RE | Admit: 2022-03-16 | Discharge: 2022-03-16 | Disposition: A | Discharge status: Home / Self Care | Payer: Medicaid Other | Source: Ambulatory Visit | Attending: Radiation Oncology | Admitting: Radiation Oncology

## 2022-03-16 ENCOUNTER — Other Ambulatory Visit: Payer: Self-pay

## 2022-03-16 DIAGNOSIS — Z510 Encounter for antineoplastic radiation therapy: Secondary | ICD-10-CM | POA: Diagnosis not present

## 2022-03-16 LAB — RAD ONC ARIA SESSION SUMMARY
Course Elapsed Days: 26
Plan Fractions Treated to Date: 14
Plan Prescribed Dose Per Fraction: 1.8 Gy
Plan Total Fractions Prescribed: 25
Plan Total Prescribed Dose: 45 Gy
Reference Point Dosage Given to Date: 25.2 Gy
Reference Point Session Dosage Given: 1.8 Gy
Session Number: 14

## 2022-03-17 ENCOUNTER — Other Ambulatory Visit: Payer: Self-pay

## 2022-03-17 ENCOUNTER — Ambulatory Visit
Admission: RE | Admit: 2022-03-17 | Discharge: 2022-03-17 | Disposition: A | Discharge status: Home / Self Care | Payer: Medicaid Other | Source: Ambulatory Visit | Attending: Radiation Oncology | Admitting: Radiation Oncology

## 2022-03-17 DIAGNOSIS — Z510 Encounter for antineoplastic radiation therapy: Secondary | ICD-10-CM | POA: Diagnosis not present

## 2022-03-17 LAB — RAD ONC ARIA SESSION SUMMARY
Course Elapsed Days: 27
Plan Fractions Treated to Date: 15
Plan Prescribed Dose Per Fraction: 1.8 Gy
Plan Total Fractions Prescribed: 25
Plan Total Prescribed Dose: 45 Gy
Reference Point Dosage Given to Date: 27 Gy
Reference Point Session Dosage Given: 1.8 Gy
Session Number: 15

## 2022-03-18 ENCOUNTER — Inpatient Hospital Stay (HOSPITAL_BASED_OUTPATIENT_CLINIC_OR_DEPARTMENT_OTHER): Payer: Medicaid Other | Admitting: Hematology and Oncology

## 2022-03-18 ENCOUNTER — Other Ambulatory Visit: Payer: Self-pay

## 2022-03-18 ENCOUNTER — Inpatient Hospital Stay: Payer: Medicaid Other | Attending: Hematology and Oncology

## 2022-03-18 ENCOUNTER — Ambulatory Visit
Admission: RE | Admit: 2022-03-18 | Discharge: 2022-03-18 | Disposition: A | Discharge status: Home / Self Care | Payer: Medicaid Other | Source: Ambulatory Visit | Attending: Radiation Oncology | Admitting: Radiation Oncology

## 2022-03-18 DIAGNOSIS — D6959 Other secondary thrombocytopenia: Secondary | ICD-10-CM | POA: Insufficient documentation

## 2022-03-18 DIAGNOSIS — Z95828 Presence of other vascular implants and grafts: Secondary | ICD-10-CM

## 2022-03-18 DIAGNOSIS — Z510 Encounter for antineoplastic radiation therapy: Secondary | ICD-10-CM | POA: Insufficient documentation

## 2022-03-18 DIAGNOSIS — Z452 Encounter for adjustment and management of vascular access device: Secondary | ICD-10-CM | POA: Insufficient documentation

## 2022-03-18 DIAGNOSIS — D6481 Anemia due to antineoplastic chemotherapy: Secondary | ICD-10-CM | POA: Diagnosis not present

## 2022-03-18 DIAGNOSIS — Z79899 Other long term (current) drug therapy: Secondary | ICD-10-CM | POA: Insufficient documentation

## 2022-03-18 DIAGNOSIS — Z7982 Long term (current) use of aspirin: Secondary | ICD-10-CM | POA: Diagnosis not present

## 2022-03-18 DIAGNOSIS — T451X5A Adverse effect of antineoplastic and immunosuppressive drugs, initial encounter: Secondary | ICD-10-CM | POA: Diagnosis not present

## 2022-03-18 DIAGNOSIS — F17210 Nicotine dependence, cigarettes, uncomplicated: Secondary | ICD-10-CM | POA: Diagnosis not present

## 2022-03-18 DIAGNOSIS — R112 Nausea with vomiting, unspecified: Secondary | ICD-10-CM | POA: Insufficient documentation

## 2022-03-18 DIAGNOSIS — R42 Dizziness and giddiness: Secondary | ICD-10-CM | POA: Insufficient documentation

## 2022-03-18 DIAGNOSIS — C250 Malignant neoplasm of head of pancreas: Secondary | ICD-10-CM

## 2022-03-18 DIAGNOSIS — Z794 Long term (current) use of insulin: Secondary | ICD-10-CM | POA: Diagnosis not present

## 2022-03-18 DIAGNOSIS — E1165 Type 2 diabetes mellitus with hyperglycemia: Secondary | ICD-10-CM | POA: Insufficient documentation

## 2022-03-18 LAB — CMP (CANCER CENTER ONLY)
ALT: 12 U/L (ref 0–44)
AST: 29 U/L (ref 15–41)
Albumin: 3.3 g/dL — ABNORMAL LOW (ref 3.5–5.0)
Alkaline Phosphatase: 145 U/L — ABNORMAL HIGH (ref 38–126)
Anion gap: 4 — ABNORMAL LOW (ref 5–15)
BUN: 8 mg/dL (ref 6–20)
CO2: 29 mmol/L (ref 22–32)
Calcium: 8.6 mg/dL — ABNORMAL LOW (ref 8.9–10.3)
Chloride: 96 mmol/L — ABNORMAL LOW (ref 98–111)
Creatinine: 0.47 mg/dL — ABNORMAL LOW (ref 0.61–1.24)
GFR, Estimated: >60 mL/min (ref >60)
Glucose, Bld: 325 mg/dL — ABNORMAL HIGH (ref 70–99)
Potassium: 4.5 mmol/L (ref 3.5–5.1)
Sodium: 129 mmol/L — ABNORMAL LOW (ref 135–145)
Total Bilirubin: 0.7 mg/dL (ref 0.3–1.2)
Total Protein: 6.9 g/dL (ref 6.5–8.1)

## 2022-03-18 LAB — RAD ONC ARIA SESSION SUMMARY
Course Elapsed Days: 28
Plan Fractions Treated to Date: 16
Plan Prescribed Dose Per Fraction: 1.8 Gy
Plan Total Fractions Prescribed: 25
Plan Total Prescribed Dose: 45 Gy
Reference Point Dosage Given to Date: 28.8 Gy
Reference Point Session Dosage Given: 1.8 Gy
Session Number: 16

## 2022-03-18 LAB — CBC WITH DIFFERENTIAL (CANCER CENTER ONLY)
Abs Immature Granulocytes: 0.04 10*3/uL (ref 0.00–0.07)
Basophils Absolute: 0 10*3/uL (ref 0.0–0.1)
Basophils Relative: 0 %
Eosinophils Absolute: 0 10*3/uL (ref 0.0–0.5)
Eosinophils Relative: 0 %
HCT: 31.9 % — ABNORMAL LOW (ref 39.0–52.0)
Hemoglobin: 11.1 g/dL — ABNORMAL LOW (ref 13.0–17.0)
Immature Granulocytes: 0 %
Lymphocytes Relative: 2 %
Lymphs Abs: 0.2 10*3/uL — ABNORMAL LOW (ref 0.7–4.0)
MCH: 30.1 pg (ref 26.0–34.0)
MCHC: 34.8 g/dL (ref 30.0–36.0)
MCV: 86.4 fL (ref 80.0–100.0)
Monocytes Absolute: 0.8 10*3/uL (ref 0.1–1.0)
Monocytes Relative: 9 %
Neutro Abs: 8.1 10*3/uL — ABNORMAL HIGH (ref 1.7–7.7)
Neutrophils Relative %: 89 %
Platelet Count: 164 10*3/uL (ref 150–400)
RBC: 3.69 MIL/uL — ABNORMAL LOW (ref 4.22–5.81)
RDW: 22.9 % — ABNORMAL HIGH (ref 11.5–15.5)
WBC Count: 9.1 10*3/uL (ref 4.0–10.5)
nRBC: 0 % (ref 0.0–0.2)

## 2022-03-18 MED ORDER — CAPECITABINE 500 MG PO TABS: 1500.0000 mg | ORAL_TABLET | Freq: Two times a day (BID) | ORAL | 0 refills | Status: DC

## 2022-03-18 MED ORDER — SODIUM CHLORIDE 0.9% FLUSH
10.0000 mL | Freq: Once | INTRAVENOUS | Status: AC
  Administered 2022-03-18: 10 mL

## 2022-03-18 MED ORDER — HEPARIN SOD (PORK) LOCK FLUSH 100 UNIT/ML IV SOLN
500.0000 [IU] | Freq: Once | INTRAVENOUS | Status: AC
  Administered 2022-03-18: 500 [IU]

## 2022-03-18 NOTE — Progress Notes (Signed)
[place] [phone]   [phone]  PROGRESS NOTE  Patient Care Team: [name], NP as PCP - General (Nurse Practitioner) Werner Lean, MD as PCP - Cardiology (Cardiology) Armbruster, Carlota Raspberry, MD as Consulting Physician (Gastroenterology) Orson Slick, MD as Consulting Physician (Oncology) Royston Bake, RN as Registered Nurse  Hematological/Oncological History # Adenocarcinoma of the Pancreas, Stage IIB 02/26/2021: patient seen by GI for jaundice and pancreatic mass causing CBD obstruction. CT abdomen showed pancreatic head mass (4.4 x 3.7 cm) and enlarged portocaval lymph nodes. 02/27/2021: ERCP with placement of a nonmetallic biliary stent. FNA of pancreatic head showed malignant cells consistent with pancreatic adenocarcinoma. 03/05/2021: CT Chest showed no evidence of metastatic disease in the chest. 03/12/2021: establish care with Dr. Lorenso Courier 04/10/2021: Cycle 1 Day 1 of FOLFIRINOX 04/24/2021: Cycle 2 Day 1 of FOLFIRINOX 05/22/2021: Cycle 3 Day 1 of FOLFIRINOX. Delayed x 2 weeks due to hospitalization for poor po intake and dehydration due to gastroparesis. 06/05/2021: Cycle 4 Day 1 of FOLFIRINOX 06/19/2021: Cycle 5 Day 1 of FOLFIRINOX 07/02/2021: Cycle 6 Day 1 of FOLFIRINOX 07/16/2021: Cycle 7 Day 1 of FOLFIRINOX  07/30/2021: Cycle 8 Day 1 of FOLFIRINOX  08/13/2021: Cycle 9 Day 1 of FOLFIRINOX  08/27/2021:  Cycle 10 Day 1 of FOLFIRINOX  09/10/2021: Cycle 11 Day 1 of FOLFIRINOX  09/24/2021: Cycle 12 Day 1 of FOLFIRINOX  10/28/2021: Underwent open whipple with portal vein reconstruction utilizing a R deep FV graft at [facility]. Positive margins noted.  02/18/2022: Start of Chemoradiation with Xeloda 850 mg/m2 on radiation days (5 days per week)   Interval History:  [name] [name] 61 y.o. male returns for a follow up visit for pancreatic cancer. He was last seen on 02/23/2022. In the interim since his last visit he has continued  chemoradiation.   On exam today Mr. Griffing is unaccompanied.  He reports he has continued on his a lot of pills on radiation days.  He notes that he is taking 3 in the morning and 3 at night as directed.  He notes he does have some episodes of diarrhea.  He also notes that he does tend to get sick throughout the week.  He has occasional bouts of nausea and vomiting.  He did have an episode of vomiting this morning.  He reports that there is a lot of urgency with his diarrhea.  He notes that he is doing his best to stay hydrated and stay on top of it.  He also notes that he has gained visual bouts of feeling dizzy when he stands up.  Otherwise he notes he is not having any dryness or cracking of his hands.  He reports he is taking Lomotil to help with his bowels but sometimes can have up to 6 bowel movements per day.  He also notes that Zofran and Compazine are frequently used to help with his nausea.  He otherwise denies any fevers, chills, sweats, constipation, or redness of his hands or palms.  A full 10 point ROS is listed below.  MEDICAL HISTORY:  Past Medical History:  Diagnosis Date   Barrett's esophagus dx 2016   Bronchitis    Chronic hepatitis C without hepatic coma ([place]) 10/31/2014   Depression    Diabetes ([place])    ED (erectile dysfunction)    GERD (gastroesophageal reflux disease)    Hepatitis C    Hiatal hernia 10/2014   3cm   Neuropathy    pancreatic ca  12/2020   S/P transmetatarsal amputation of foot, left ([place]) 11/28/2020    SURGICAL HISTORY: Past Surgical History:  Procedure Laterality Date   AMPUTATION Left 11/15/2020   Procedure: LEFT TRANSMETATARSALS AMPUTATION;  Surgeon: Newt Minion, MD;  Location: [facility];  Service: Orthopedics;  Laterality: Left;   APPENDECTOMY     BILIARY STENT PLACEMENT N/A 02/27/2021   Procedure: BILIARY STENT PLACEMENT;  Surgeon: Milus Banister, MD;  Location: [facility];  Service: Endoscopy;  Laterality: N/A;   BILIARY STENT PLACEMENT N/A  03/27/2021   Procedure: BILIARY STENT PLACEMENT;  Surgeon: Jackquline Denmark, MD;  Location: [facility];  Service: Endoscopy;  Laterality: N/A;   BIOPSY  03/27/2021   Procedure: BIOPSY;  Surgeon: Jackquline Denmark, MD;  Location: [facility];  Service: Endoscopy;;   COLONOSCOPY N/A 02/16/2014   Procedure: COLONOSCOPY;  Surgeon: Gatha Mayer, MD;  [facility]: [facility];  Service: Endoscopy;  Laterality: N/A;   COLONOSCOPY     ENDOSCOPIC RETROGRADE CHOLANGIOPANCREATOGRAPHY (ERCP) WITH PROPOFOL N/A 02/27/2021   Procedure: ENDOSCOPIC RETROGRADE CHOLANGIOPANCREATOGRAPHY (ERCP) WITH PROPOFOL;  Surgeon: Milus Banister, MD;  [facility];  Service: Endoscopy;  Laterality: N/A;   ERCP N/A 03/27/2021   Procedure: ENDOSCOPIC RETROGRADE CHOLANGIOPANCREATOGRAPHY (ERCP);  Surgeon: Jackquline Denmark, MD;  Location: [facility];  Service: Endoscopy;  Laterality: N/A;   ESOPHAGOGASTRODUODENOSCOPY (EGD) WITH PROPOFOL N/A 05/06/2021   Procedure: ESOPHAGOGASTRODUODENOSCOPY (EGD) WITH PROPOFOL;  Surgeon: Mauri Pole, MD;  [facility];  Service: Endoscopy;  Laterality: N/A;   EUS N/A 02/27/2021   Procedure: UPPER ENDOSCOPIC ULTRASOUND (EUS) RADIAL;  Surgeon: Milus Banister, MD;  Location: [facility];  Service: Endoscopy;  Laterality: N/A;   FINE NEEDLE ASPIRATION N/A 02/27/2021   Procedure: FINE NEEDLE ASPIRATION (FNA) LINEAR;  Surgeon: Milus Banister, MD;  [facility];  Service: Endoscopy;  Laterality: N/A;   IR IMAGING GUIDED PORT INSERTION  03/21/2021   SPHINCTEROTOMY  02/27/2021   Procedure: SPHINCTEROTOMY;  Surgeon: Milus Banister, MD;  [facility];  Service: Endoscopy;;   STENT REMOVAL  03/27/2021   Procedure: STENT REMOVAL;  Surgeon: Jackquline Denmark, MD;  [facility];  Service: Endoscopy;;   TONSILLECTOMY      SOCIAL HISTORY: Social History   Socioeconomic History   Marital status: Single    Spouse name: Not on file   Number of children: Not on  file   Years of education: Not on file   Highest education level: Not on file  Occupational History   Not on file  Tobacco Use   Smoking status: Every Day    Packs/day: 0.50    Years: 35.00    Total pack years: 17.50    Types: Cigarettes   Smokeless tobacco: Never   Tobacco comments:    Currently being followed by the smoking cessation program   Vaping Use   Vaping Use: Never used  Substance and Sexual Activity   Alcohol use: Not Currently    Comment: previous   Drug use: No   Sexual activity: Not on file  Other Topics Concern   Not on file  Social History Narrative   Not on file   Social Determinants of Health   Financial Resource Strain: High Risk (03/13/2021)   Overall Financial Resource Strain (CARDIA)    Difficulty of Paying Living Expenses: Very hard  Food Insecurity: Food Insecurity Present (05/15/2021)   Hunger Vital Sign    Worried About Running Out of Food in the Last Year: Sometimes true  Ran Out of Food in the Last Year: Sometimes true  Transportation Needs: Unmet Transportation Needs (03/13/2021)   PRAPARE - Hydrologist (Medical): Yes    Lack of Transportation (Non-Medical): Yes  Physical Activity: Not on file  Stress: Not on file  Social Connections: Not on file  Intimate Partner Violence: Not At Risk (12/02/2021)   Humiliation, Afraid, Rape, and Kick questionnaire    Fear of Current or Ex-Partner: No    Emotionally Abused: No    Physically Abused: No    Sexually Abused: No    FAMILY HISTORY: Family History  Problem Relation Age of Onset   Breast cancer Mother    Stroke Father    Alcohol abuse Father    Heart disease Maternal Grandfather    Pancreatic cancer Paternal Grandmother    Diabetes Paternal Grandfather    Colon cancer Neg Hx    Stomach cancer Neg Hx    Rectal cancer Neg Hx    Esophageal cancer Neg Hx     ALLERGIES:  is allergic to bee venom and lactose intolerance (gi).  MEDICATIONS:  Current  Outpatient Medications  Medication Sig Dispense Refill   capecitabine (XELODA) 500 MG tablet Take 3 tablets (1,500 mg total) by mouth 2 (two) times daily after a meal. 60 tablet 0   Accu-Chek Softclix Lancets lancets Check blood glucose level by fingerstick 3-4 times per day. 100 each 3   acetaminophen (TYLENOL) 500 MG tablet Take 500 mg by mouth every 6 (six) hours as needed for moderate pain.     albuterol (VENTOLIN HFA) 108 (90 Base) MCG/ACT inhaler Inhale 2 puffs into the lungs every 6 (six) hours as needed for wheezing or shortness of breath. 18 g 0   aspirin 81 MG EC tablet Take 1 tablet by mouth daily. 100 tablet 0   atorvastatin (LIPITOR) 20 MG tablet Take 1 tablet (20 mg total) by mouth daily. 90 tablet 0   Continuous Blood Gluc Sensor (FREESTYLE LIBRE 2 SENSOR) MISC Use as directed. 1 each 6   diphenoxylate-atropine (LOMOTIL) 2.5-0.025 MG tablet Take 1 tablet by mouth 4 (four) times daily as needed for diarrhea or loose stools. 60 tablet 1   DULoxetine (CYMBALTA) 60 MG capsule Take 1 capsule (60 mg total) by mouth daily. 90 capsule 1   EPINEPHrine 0.3 mg/0.3 mL IJ SOAJ injection Inject 0.3 mg into the muscle once as needed for anaphylaxis.     folic acid (FOLVITE) 1 MG tablet Take 1 tablet (1 mg total) by mouth in the morning.     glucose blood (ACCU-CHEK GUIDE) test strip Check blood glucose level by fingerstick 3 - 4 times per day. 100 each 12   insulin degludec (TRESIBA FLEXTOUCH) 100 UNIT/ML FlexTouch Pen Inject 24 Units into the skin daily. (Patient not taking: Reported on 03/18/2022) 9 mL 3   Insulin Lispro-aabc (LYUMJEV KWIKPEN) 100 UNIT/ML KwikPen Inject 5-8 Units into the skin 3 (three) times daily before meals. 15 mL 3   Insulin Pen Needle (TRUEPLUS PEN NEEDLES) 32G X 4 MM MISC Use as instructed. Inject into the skin twice daily 200 each 6   lidocaine-prilocaine (EMLA) cream Apply topically as directed when needed. 30 g 0   lipase/protease/amylase (CREON) 12000-38000 units CPEP  capsule Take 37,169-67,893 Units by mouth See admin instructions. Take 2 capsules by mouth daily with meals and may take 1 capsule by mouth as needed with snakes.     mirtazapine (REMERON) 30 MG tablet Take 1 tablet (  30 mg total) by mouth at bedtime. 90 tablet 0   Multiple Vitamins-Minerals (THEREMS-M) TABS Take 1 tablet by mouth in the morning. 90 tablet 3   mupirocin ointment (BACTROBAN) 2 % Apply to wound surface with each dressing change. 22 g 0   nicotine (NICODERM CQ - DOSED IN MG/24 HOURS) 14 mg/24hr patch Apply one patch daily x 12 weeks. 84 patch 1   ondansetron (ZOFRAN) 8 MG tablet Take 1 tablet (8 mg total) by mouth 2 (two) times daily as needed. Start on day 3 after chemotherapy. 30 tablet 1   oxyCODONE (OXY IR/ROXICODONE) 5 MG immediate release tablet Take 1 to 2 tablets by mouth every 6 hours as needed for severe pain. 120 tablet 0   oxyCODONE ER (XTAMPZA ER) 9 MG C12A Take 1 capsule by mouth every 12 hours. 60 capsule 0   pantoprazole (PROTONIX) 40 MG tablet Take 1 tablet (40 mg total) by mouth daily. 90 tablet 0   polyethylene glycol powder (GLYCOLAX/MIRALAX) 17 GM/SCOOP powder Take 17 g by mouth at bedtime. 238 g 0   prochlorperazine (COMPAZINE) 10 MG tablet Take 1 tablet (10 mg total) by mouth every 6 (six) hours as needed for nausea or vomiting. 30 tablet 1   varenicline (APO-VARENICLINE) 1 MG tablet Take 1 tablet by mouth once daily in the morning with food 84 tablet 1   zolpidem (AMBIEN) 10 MG tablet Take 1 tablet (10 mg total) by mouth at bedtime as needed for sleep. 30 tablet 1   No current facility-administered medications for this visit.    REVIEW OF SYSTEMS:   Constitutional: ( - ) fevers, ( - )  chills , ( - ) night sweats Eyes: ( - ) blurriness of vision, ( - ) double vision, ( - ) watery eyes Ears, nose, mouth, throat, and face: ( - ) mucositis, ( - ) sore throat Respiratory: ( - ) cough, ( - ) dyspnea, ( - ) wheezes Cardiovascular: ( - ) palpitation, ( - ) chest  discomfort, ( - ) lower extremity swelling Gastrointestinal:  ( +) nausea, ( - ) heartburn, ( - ) change in bowel habits Skin: ( - ) abnormal skin rashes Lymphatics: ( - ) new lymphadenopathy, ( - ) easy bruising Neurological: ( - ) numbness, ( - ) tingling, ( - ) new weaknesses Behavioral/Psych: ( - ) mood change, ( - ) new changes  All other systems were reviewed with the patient and are negative.  PHYSICAL EXAMINATION: ECOG PERFORMANCE STATUS: 1 - Symptomatic but completely ambulatory  There were no vitals filed for this visit. There were no vitals filed for this visit.  GENERAL: Well-appearing middle-age Caucasian male, alert, no distress and comfortable HEAD: Anterior laceration of the scalp, well healed  SKIN: skin color, texture, turgor are normal, no rashes or significant lesions.  EYES: conjunctiva are pink and non-injected, sclera clear LUNGS: clear to auscultation and percussion with normal breathing effort HEART: regular rate & rhythm and no murmurs and no lower extremity edema ABDOMEN: Midline incision healing. Well healed lesion on right abdomen.  EXTREMITIES: wound currently dressed.  PSYCH: alert & oriented x 3, fluent speech NEURO: no focal motor/sensory deficits  LABORATORY DATA:  I have reviewed the data as listed    Latest Ref Rng & Units 03/18/2022   10:28 AM 02/23/2022    8:42 AM 02/02/2022    8:58 AM  CBC  WBC 4.0 - 10.5 K/uL 9.1  8.4  11.0   Hemoglobin 13.0 -  17.0 g/dL 11.1  10.0  11.2   Hematocrit 39.0 - 52.0 % 31.9  30.4  34.5   Platelets 150 - 400 K/uL 164  268  408        Latest Ref Rng & Units 03/18/2022   10:28 AM 02/23/2022    8:42 AM 02/02/2022    8:58 AM  CMP  Glucose 70 - 99 mg/dL 325  378  118   BUN 6 - 20 mg/dL '8  10  8   '$ Creatinine 0.61 - 1.24 mg/dL 0.47  0.53  0.43   Sodium 135 - 145 mmol/L 129  129  140   Potassium 3.5 - 5.1 mmol/L 4.5  4.1  3.6   Chloride 98 - 111 mmol/L 96  97  105   CO2 22 - 32 mmol/L '29  28  27   '$ Calcium 8.9 -  10.3 mg/dL 8.6  9.1  9.0   Total Protein 6.5 - 8.1 g/dL 6.9  7.5  7.3   Total Bilirubin 0.3 - 1.2 mg/dL 0.7  0.4  0.3   Alkaline Phos 38 - 126 U/L 145  183  184   AST 15 - 41 U/L '29  15  27   '$ ALT 0 - 44 U/L '12  12  11    '$ PATHOLOGY:  Procedure:    Pancreaticoduodenectomy (Whipple resection), partial pancreatectomy   TUMOR     Tumor Site:    Pancreatic head     Histologic Type:    Ductal adenocarcinoma (NOS)     Histologic Grade:    G2, moderately differentiated     Tumor Size:    Greatest Dimension (Centimeters): 3.7 cm       Additional Dimension (Centimeters):    2.6 cm       Additional Dimension (Centimeters):    2.1 cm     Site(s) Involved by Direct Tumor Extension:    Ampulla of Vater or sphincter of Oddi     Site(s) Involved by Direct Tumor Extension:    Duodenal wall     Site(s) Involved by Direct Tumor Extension:    Superior mesenteric vein     Treatment Effect:    Absent, with extensive residual cancer and no evident tumor regression (poor or no response, score 3)     Lymphovascular Invasion:    Present     Perineural Invasion:    Present   MARGINS     Margin Status for Invasive Carcinoma:    Invasive carcinoma present at margin       Margin(s) Involved by Invasive Carcinoma:    Pancreatic neck / parenchymal       Margin(s) Involved by Invasive Carcinoma:    Uncinate (retroperitoneal / superior mesenteric artery)     Margin Status for Dysplasia and Intraepithelial Neoplasia:    All margins negative for dysplasia and intraepithelial neoplasia   REGIONAL LYMPH NODES     Regional Lymph Node Status:           :    All regional lymph nodes negative for tumor       Number of Lymph Nodes Examined:    36   PATHOLOGIC STAGE CLASSIFICATION  (pTNM, AJCC 8th Edition)     Reporting of pT, pN, and (when applicable) pM categories is based on information available to the pathologist at the time the report is issued. As per the AJCC (Chapter 1, 8th Ed.) it is the managing physician's  responsibility to establish the final pathologic stage based  upon all pertinent information, including but potentially not limited to this pathology report.     TNM Descriptors:    y (post-treatment)     pT Category:    pT2     pN Category:    pN0   RADIOGRAPHIC STUDIES: I have personally reviewed the radiological images as listed and agreed with the findings in the report: Borderline resectable pancreatic mass with no evidence of metastatic disease. No results found.  ASSESSMENT & PLAN MICHOEL KUNIN 61 y.o. male with medical history significant for borderline resectable pancreatic cancer presents for a follow up visit.   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  The patient does still have an elevation in bilirubin which likely represents a decline from the time of the latest stent placement.  He was evaluated by surgery who determined this is a borderline resectable tumor.  We will plan to start the patient on neoadjuvant chemotherapy.  The 2 options would be Gem/Abraxane or FOLFIRINOX.  Given his excellent functional status I do believe he would be a good candidate for FOLFIRINOX.  He completed 12 cycles of neoadjuvant FOLFIRINOX.  He underwent open whipple with portal vein reconstruction utilizing a R deep FV graft on 10/28/2021 at [facility].  Due to positive margins we will plan to treat with chemoradiation moving forward.  The treatment of choice will be Xeloda 850 mg/m2 on radiation days (5 days per week) over the course of radiation.  Currently radiation oncology intends for 5-1/2 weeks of treatment.  # Adenocarcinoma of the Pancreas, Stage IIB -- At this time disease appears borderline resectable.  We will proceed with neoadjuvant chemotherapy.  At this time would prefer a regimen of FOLFIRINOX given his good baseline health. -- case reviewed by  surgery, who agrees his disease is borderline resectable.  -- baseline elevations in both CA 19-9 and CEA. Continue to monitor during treatment.  -- Cycle 1 Day 1 of FOLFIRINOX started 04/10/2021. Cycle 12 Day 1 on 09/24/2021, completed neoadjuvant chemotherapy. --Underwent open whipple with portal vein reconstruction utilizing a R deep FV graft on 10/28/2021 at [facility]. Noted to have positive margins, adjuvant chemoradiation was recommended. Plan: --started chemoradiation with Xeloda on 02/18/2022 --continue Xeloda 850 mg/m2 on radiation days (5 days per week) over the course of radiation. Last treatment 04/03/2022.  --RTC in 2 weeks time to reassess   #Forehead laceration: --well healing, markedly improved.   #Post Surgical Infections-improving --WBC 9.1, Hgb 11.1 -- currently following with [facility] service.  --No evidence of infection at this time.  #Gastroparesis #Nausea/Vomiting/Poor PO Intake #Diarrhea --patient advanced diet to solid foods.  --nausea meds/antidiarrheal as below. --continue to work on glycemic control --continue to monitor.   #Chemotherapy induced Anemia and Thrombocytopenia --Hgb is stable at 11.1, stable. continue to monitor --Plt stable at 164 K --continue to monitor   # Pain Control -- Patient currently taking ibuprofen for his abdominal pain --continue oxycodone 10 mg every 6 hours as needed. He has consistently been taking '40mg'$  PO daily total. --continue Xtampza 9 mg q 12H   #Hyperglycemia: --today's glucose level is 325 --insulin-requiring type 2 DM --under the care of endocrinology  #Left foot wound: -- He has established care with wound care center -- Foot wound was observed today and does not appear to be actively infected.  No erythema or tenderness.  No purulent discharge.  #Supportive  Care -- chemotherapy education complete -- port placed -- zofran '8mg'$  q8H PRN and compazine '10mg'$  PO q6H for nausea --Creon to help with loose  stools. --lomotil q 6 H PRN for diarrhea.  -- EMLA cream for port  No orders of the defined types were placed in this encounter.  All questions were answered. The patient knows to call the clinic with any problems, questions or concerns.  I have spent a total of 30 minutes minutes of face-to-face and non-face-to-face time, preparing to see the patient,  performing a medically appropriate examination, counseling and educating the patient, ordering tests/procedures, documenting clinical information in the electronic health record and care coordination.   Ledell Peoples, MD Department of Hematology/Oncology [place] at [facility] Phone: [phone] Pager: [phone] Email: [email or web address]

## 2022-03-19 ENCOUNTER — Other Ambulatory Visit: Payer: Self-pay

## 2022-03-19 ENCOUNTER — Other Ambulatory Visit (HOSPITAL_COMMUNITY): Payer: Self-pay

## 2022-03-19 ENCOUNTER — Ambulatory Visit
Admission: RE | Admit: 2022-03-19 | Discharge: 2022-03-19 | Disposition: A | Discharge status: Home / Self Care | Payer: Medicaid Other | Source: Ambulatory Visit | Attending: Radiation Oncology | Admitting: Radiation Oncology

## 2022-03-19 DIAGNOSIS — Z510 Encounter for antineoplastic radiation therapy: Secondary | ICD-10-CM | POA: Diagnosis not present

## 2022-03-19 LAB — RAD ONC ARIA SESSION SUMMARY
Course Elapsed Days: 29
Plan Fractions Treated to Date: 17
Plan Prescribed Dose Per Fraction: 1.8 Gy
Plan Total Fractions Prescribed: 25
Plan Total Prescribed Dose: 45 Gy
Reference Point Dosage Given to Date: 30.6 Gy
Reference Point Session Dosage Given: 1.8 Gy
Session Number: 17

## 2022-03-20 ENCOUNTER — Ambulatory Visit
Admission: RE | Admit: 2022-03-20 | Discharge: 2022-03-20 | Disposition: A | Discharge status: Home / Self Care | Payer: Medicaid Other | Source: Ambulatory Visit | Attending: Radiation Oncology | Admitting: Radiation Oncology

## 2022-03-20 ENCOUNTER — Other Ambulatory Visit: Payer: Self-pay

## 2022-03-20 DIAGNOSIS — Z510 Encounter for antineoplastic radiation therapy: Secondary | ICD-10-CM | POA: Diagnosis not present

## 2022-03-20 LAB — RAD ONC ARIA SESSION SUMMARY
Course Elapsed Days: 30
Plan Fractions Treated to Date: 18
Plan Prescribed Dose Per Fraction: 1.8 Gy
Plan Total Fractions Prescribed: 25
Plan Total Prescribed Dose: 45 Gy
Reference Point Dosage Given to Date: 32.4 Gy
Reference Point Session Dosage Given: 1.8 Gy
Session Number: 18

## 2022-03-22 ENCOUNTER — Encounter: Payer: Self-pay | Admitting: Hematology and Oncology

## 2022-03-22 DIAGNOSIS — Z510 Encounter for antineoplastic radiation therapy: Secondary | ICD-10-CM | POA: Diagnosis not present

## 2022-03-23 ENCOUNTER — Ambulatory Visit: Payer: Medicaid Other

## 2022-03-23 ENCOUNTER — Other Ambulatory Visit: Payer: Self-pay | Admitting: Nurse Practitioner

## 2022-03-23 ENCOUNTER — Other Ambulatory Visit: Payer: Self-pay | Admitting: Hematology and Oncology

## 2022-03-23 ENCOUNTER — Encounter (HOSPITAL_BASED_OUTPATIENT_CLINIC_OR_DEPARTMENT_OTHER): Payer: Medicaid Other | Admitting: General Surgery

## 2022-03-23 ENCOUNTER — Other Ambulatory Visit: Payer: Self-pay

## 2022-03-23 ENCOUNTER — Other Ambulatory Visit (HOSPITAL_COMMUNITY): Payer: Self-pay

## 2022-03-23 DIAGNOSIS — K3184 Gastroparesis: Secondary | ICD-10-CM

## 2022-03-23 DIAGNOSIS — K2210 Ulcer of esophagus without bleeding: Secondary | ICD-10-CM

## 2022-03-23 DIAGNOSIS — F17200 Nicotine dependence, unspecified, uncomplicated: Secondary | ICD-10-CM

## 2022-03-23 DIAGNOSIS — K219 Gastro-esophageal reflux disease without esophagitis: Secondary | ICD-10-CM

## 2022-03-23 MED ORDER — ASPIRIN 81 MG PO TBEC
81.0000 mg | DELAYED_RELEASE_TABLET | Freq: Every day | ORAL | 0 refills | Status: DC
  Filled 2022-03-23: qty 100, 100d supply, fill #0

## 2022-03-23 MED ORDER — ALBUTEROL SULFATE HFA 108 (90 BASE) MCG/ACT IN AERS
INHALATION_SPRAY | RESPIRATORY_TRACT | 0 refills | Status: DC
  Filled 2022-03-23: qty 18, 25d supply, fill #0

## 2022-03-23 MED ORDER — ACETAMINOPHEN 500 MG PO TABS
500.0000 mg | ORAL_TABLET | Freq: Four times a day (QID) | ORAL | 3 refills | Status: AC | PRN
  Filled 2022-03-23 – 2022-04-29 (×5): qty 60, 15d supply, fill #0
  Filled 2022-04-30: qty 100, 25d supply, fill #0
  Filled 2022-05-25: qty 100, 25d supply, fill #1
  Filled 2022-06-29 – 2022-07-20 (×2): qty 100, 25d supply, fill #2

## 2022-03-23 MED ORDER — ATORVASTATIN CALCIUM 20 MG PO TABS
20.0000 mg | ORAL_TABLET | Freq: Every day | ORAL | 0 refills | Status: DC
  Filled 2022-03-23 – 2022-04-15 (×2): qty 90, 90d supply, fill #0

## 2022-03-23 MED ORDER — MUPIROCIN 2 % EX OINT
TOPICAL_OINTMENT | CUTANEOUS | 0 refills | Status: DC
  Filled 2022-03-23: qty 22, 30d supply, fill #0

## 2022-03-23 MED ORDER — PANTOPRAZOLE SODIUM 40 MG PO TBEC
40.0000 mg | DELAYED_RELEASE_TABLET | Freq: Every day | ORAL | 0 refills | Status: DC
  Filled 2022-03-23 – 2022-04-15 (×2): qty 90, 90d supply, fill #0

## 2022-03-23 MED ORDER — OXYCODONE HCL 5 MG PO TABS
5.0000 mg | ORAL_TABLET | Freq: Four times a day (QID) | ORAL | 0 refills | Status: DC | PRN
  Filled 2022-03-23: qty 120, 15d supply, fill #0

## 2022-03-24 ENCOUNTER — Other Ambulatory Visit (HOSPITAL_COMMUNITY): Payer: Self-pay

## 2022-03-24 ENCOUNTER — Ambulatory Visit: Payer: Medicaid Other

## 2022-03-24 MED ORDER — VARENICLINE TARTRATE 1 MG PO TABS
ORAL_TABLET | ORAL | 1 refills | Status: DC
  Filled 2022-03-24: qty 30, 30d supply, fill #0
  Filled 2022-04-29: qty 30, 30d supply, fill #1

## 2022-03-25 ENCOUNTER — Ambulatory Visit: Payer: Medicaid Other

## 2022-03-25 ENCOUNTER — Encounter: Payer: Self-pay | Admitting: Hematology and Oncology

## 2022-03-25 ENCOUNTER — Other Ambulatory Visit: Payer: Self-pay | Admitting: Hematology and Oncology

## 2022-03-25 ENCOUNTER — Other Ambulatory Visit (HOSPITAL_COMMUNITY): Payer: Self-pay

## 2022-03-25 DIAGNOSIS — C250 Malignant neoplasm of head of pancreas: Secondary | ICD-10-CM

## 2022-03-26 ENCOUNTER — Ambulatory Visit: Payer: Medicaid Other

## 2022-03-26 ENCOUNTER — Other Ambulatory Visit: Payer: Self-pay | Admitting: Family Medicine

## 2022-03-26 ENCOUNTER — Ambulatory Visit
Admission: RE | Admit: 2022-03-26 | Discharge: 2022-03-26 | Disposition: A | Discharge status: Home / Self Care | Payer: Medicaid Other | Source: Ambulatory Visit | Attending: Radiation Oncology | Admitting: Radiation Oncology

## 2022-03-26 ENCOUNTER — Encounter: Payer: Self-pay | Admitting: Hematology and Oncology

## 2022-03-26 ENCOUNTER — Other Ambulatory Visit (HOSPITAL_COMMUNITY): Payer: Self-pay

## 2022-03-26 ENCOUNTER — Other Ambulatory Visit: Payer: Self-pay

## 2022-03-26 ENCOUNTER — Other Ambulatory Visit: Payer: Self-pay | Admitting: Hematology and Oncology

## 2022-03-26 DIAGNOSIS — Z510 Encounter for antineoplastic radiation therapy: Secondary | ICD-10-CM | POA: Diagnosis not present

## 2022-03-26 DIAGNOSIS — C250 Malignant neoplasm of head of pancreas: Secondary | ICD-10-CM

## 2022-03-26 LAB — RAD ONC ARIA SESSION SUMMARY
Course Elapsed Days: 36
Plan Fractions Treated to Date: 19
Plan Prescribed Dose Per Fraction: 1.8 Gy
Plan Total Fractions Prescribed: 25
Plan Total Prescribed Dose: 45 Gy
Reference Point Dosage Given to Date: 34.2 Gy
Reference Point Session Dosage Given: 1.8 Gy
Session Number: 19

## 2022-03-26 MED ORDER — ASPIRIN 81 MG PO TBEC
81.0000 mg | DELAYED_RELEASE_TABLET | Freq: Every day | ORAL | 0 refills | Status: DC
  Filled 2022-03-26: qty 100, 100d supply, fill #0

## 2022-03-27 ENCOUNTER — Ambulatory Visit: Payer: Medicaid Other

## 2022-03-27 ENCOUNTER — Other Ambulatory Visit: Payer: Self-pay

## 2022-03-27 ENCOUNTER — Ambulatory Visit
Admission: RE | Admit: 2022-03-27 | Discharge: 2022-03-27 | Disposition: A | Discharge status: Home / Self Care | Payer: Medicaid Other | Source: Ambulatory Visit | Attending: Radiation Oncology | Admitting: Radiation Oncology

## 2022-03-27 DIAGNOSIS — Z510 Encounter for antineoplastic radiation therapy: Secondary | ICD-10-CM | POA: Diagnosis not present

## 2022-03-27 LAB — RAD ONC ARIA SESSION SUMMARY
Course Elapsed Days: 37
Plan Fractions Treated to Date: 20
Plan Prescribed Dose Per Fraction: 1.8 Gy
Plan Total Fractions Prescribed: 25
Plan Total Prescribed Dose: 45 Gy
Reference Point Dosage Given to Date: 36 Gy
Reference Point Session Dosage Given: 1.8 Gy
Session Number: 20

## 2022-03-30 ENCOUNTER — Ambulatory Visit: Payer: Medicaid Other

## 2022-03-30 ENCOUNTER — Ambulatory Visit
Admission: RE | Admit: 2022-03-30 | Discharge: 2022-03-30 | Disposition: A | Discharge status: Home / Self Care | Payer: Medicaid Other | Source: Ambulatory Visit | Attending: Radiation Oncology | Admitting: Radiation Oncology

## 2022-03-30 ENCOUNTER — Other Ambulatory Visit: Payer: Self-pay

## 2022-03-30 ENCOUNTER — Other Ambulatory Visit: Payer: Self-pay | Admitting: Nurse Practitioner

## 2022-03-30 ENCOUNTER — Encounter (HOSPITAL_BASED_OUTPATIENT_CLINIC_OR_DEPARTMENT_OTHER): Payer: Medicaid Other | Admitting: General Surgery

## 2022-03-30 DIAGNOSIS — Z510 Encounter for antineoplastic radiation therapy: Secondary | ICD-10-CM | POA: Diagnosis not present

## 2022-03-30 DIAGNOSIS — E11621 Type 2 diabetes mellitus with foot ulcer: Secondary | ICD-10-CM | POA: Diagnosis not present

## 2022-03-30 LAB — RAD ONC ARIA SESSION SUMMARY
Course Elapsed Days: 40
Plan Fractions Treated to Date: 21
Plan Prescribed Dose Per Fraction: 1.8 Gy
Plan Total Fractions Prescribed: 25
Plan Total Prescribed Dose: 45 Gy
Reference Point Dosage Given to Date: 37.8 Gy
Reference Point Session Dosage Given: 1.8 Gy
Session Number: 21

## 2022-03-30 MED ORDER — PANCRELIPASE (LIP-PROT-AMYL) 36000-114000 UNITS PO CPEP
36000.0000 [IU] | ORAL_CAPSULE | Freq: Three times a day (TID) | ORAL | 3 refills | Status: AC
  Filled 2022-03-30: qty 19, 7d supply, fill #0
  Filled 2022-03-31: qty 251, 83d supply, fill #0
  Filled 2022-06-29: qty 270, 90d supply, fill #1
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
## 2022-03-31 ENCOUNTER — Ambulatory Visit: Payer: Medicaid Other

## 2022-03-31 ENCOUNTER — Other Ambulatory Visit (HOSPITAL_COMMUNITY): Payer: Self-pay

## 2022-03-31 ENCOUNTER — Other Ambulatory Visit: Payer: Self-pay

## 2022-04-01 ENCOUNTER — Other Ambulatory Visit (HOSPITAL_COMMUNITY): Payer: Self-pay

## 2022-04-01 ENCOUNTER — Other Ambulatory Visit: Payer: Self-pay

## 2022-04-01 ENCOUNTER — Ambulatory Visit: Payer: Medicaid Other

## 2022-04-01 ENCOUNTER — Ambulatory Visit
Admission: RE | Admit: 2022-04-01 | Discharge: 2022-04-01 | Disposition: A | Discharge status: Home / Self Care | Payer: Medicaid Other | Source: Ambulatory Visit | Attending: Radiation Oncology | Admitting: Radiation Oncology

## 2022-04-01 DIAGNOSIS — Z510 Encounter for antineoplastic radiation therapy: Secondary | ICD-10-CM | POA: Insufficient documentation

## 2022-04-01 DIAGNOSIS — C250 Malignant neoplasm of head of pancreas: Secondary | ICD-10-CM | POA: Insufficient documentation

## 2022-04-01 LAB — RAD ONC ARIA SESSION SUMMARY
Course Elapsed Days: 42
Plan Fractions Treated to Date: 22
Plan Prescribed Dose Per Fraction: 1.8 Gy
Plan Total Fractions Prescribed: 25
Plan Total Prescribed Dose: 45 Gy
Reference Point Dosage Given to Date: 39.6 Gy
Reference Point Session Dosage Given: 1.8 Gy
Session Number: 22

## 2022-04-02 ENCOUNTER — Ambulatory Visit: Payer: Medicaid Other

## 2022-04-02 ENCOUNTER — Ambulatory Visit
Admission: RE | Admit: 2022-04-02 | Discharge: 2022-04-02 | Disposition: A | Discharge status: Home / Self Care | Payer: Medicaid Other | Source: Ambulatory Visit | Attending: Radiation Oncology | Admitting: Radiation Oncology

## 2022-04-02 ENCOUNTER — Other Ambulatory Visit: Payer: Self-pay

## 2022-04-02 DIAGNOSIS — Z510 Encounter for antineoplastic radiation therapy: Secondary | ICD-10-CM | POA: Diagnosis not present

## 2022-04-02 LAB — RAD ONC ARIA SESSION SUMMARY
Course Elapsed Days: 43
Plan Fractions Treated to Date: 23
Plan Prescribed Dose Per Fraction: 1.8 Gy
Plan Total Fractions Prescribed: 25
Plan Total Prescribed Dose: 45 Gy
Reference Point Dosage Given to Date: 41.4 Gy
Reference Point Session Dosage Given: 1.8 Gy
Session Number: 23

## 2022-04-03 ENCOUNTER — Ambulatory Visit (INDEPENDENT_AMBULATORY_CARE_PROVIDER_SITE_OTHER): Payer: Medicaid Other | Admitting: Internal Medicine

## 2022-04-03 ENCOUNTER — Other Ambulatory Visit: Payer: Self-pay

## 2022-04-03 ENCOUNTER — Ambulatory Visit
Admission: RE | Admit: 2022-04-03 | Discharge: 2022-04-03 | Disposition: A | Discharge status: Home / Self Care | Payer: Medicaid Other | Source: Ambulatory Visit | Attending: Radiation Oncology | Admitting: Radiation Oncology

## 2022-04-03 ENCOUNTER — Other Ambulatory Visit: Payer: Self-pay | Admitting: *Deleted

## 2022-04-03 ENCOUNTER — Ambulatory Visit: Payer: Medicaid Other

## 2022-04-03 ENCOUNTER — Other Ambulatory Visit (HOSPITAL_COMMUNITY): Payer: Self-pay

## 2022-04-03 ENCOUNTER — Telehealth: Payer: Self-pay

## 2022-04-03 ENCOUNTER — Encounter: Payer: Self-pay | Admitting: Internal Medicine

## 2022-04-03 VITALS — BP 144/90 | HR 94 | Ht 71.0 in | Wt 121.8 lb

## 2022-04-03 DIAGNOSIS — Z794 Long term (current) use of insulin: Secondary | ICD-10-CM

## 2022-04-03 DIAGNOSIS — E785 Hyperlipidemia, unspecified: Secondary | ICD-10-CM

## 2022-04-03 DIAGNOSIS — E1169 Type 2 diabetes mellitus with other specified complication: Secondary | ICD-10-CM

## 2022-04-03 DIAGNOSIS — Z510 Encounter for antineoplastic radiation therapy: Secondary | ICD-10-CM | POA: Diagnosis not present

## 2022-04-03 DIAGNOSIS — E1140 Type 2 diabetes mellitus with diabetic neuropathy, unspecified: Secondary | ICD-10-CM | POA: Diagnosis not present

## 2022-04-03 LAB — RAD ONC ARIA SESSION SUMMARY
Course Elapsed Days: 44
Plan Fractions Treated to Date: 24
Plan Prescribed Dose Per Fraction: 1.8 Gy
Plan Total Fractions Prescribed: 25
Plan Total Prescribed Dose: 45 Gy
Reference Point Dosage Given to Date: 43.2 Gy
Reference Point Session Dosage Given: 1.8 Gy
Session Number: 24

## 2022-04-03 LAB — POCT GLYCOSYLATED HEMOGLOBIN (HGB A1C): Hemoglobin A1C: 8.7 % — AB (ref 4.0–5.6)

## 2022-04-03 MED ORDER — CAPECITABINE 500 MG PO TABS: 1500.0000 mg | ORAL_TABLET | Freq: Two times a day (BID) | ORAL | 0 refills | Status: DC

## 2022-04-03 MED ORDER — FREESTYLE LIBRE 2 SENSOR MISC
3 refills | Status: DC
  Filled 2022-04-03: qty 2, 28d supply, fill #0
  Filled 2022-04-07: qty 6, 84d supply, fill #0
  Filled 2022-06-29: qty 6, 84d supply, fill #1

## 2022-04-03 NOTE — Progress Notes (Signed)
Patient ID: [name] [name], male   DOB: 07-27-1961, 61 y.o.   MRN: [id number]  HPI: [name] [name] is a 61 y.o.-year-old male, returning for follow-up for DM2, dx in 2014, insulin-dependent since 2021, uncontrolled, with complications (peripheral neuropathy, left transmetatarsal amputation, gastroparesis, ED). Pt. previously saw Dr. Loanne Drilling, last visit 2.5 months ago.  He is here with his friend.  Interim history: Patient has a pertinent history of pancreatic cancer (head of the pancreas, dx. 2022).  He is status post biliary stents and then Whipple procedure 10/2021. He lost 80 lbs since before our last visit. His blood sugars remain high.  Reviewing his labs, he had a glucose of 378 1 month ago. Upon questioning, he felt that his insulin regimen was dropping his blood sugars too low overnight and he stopped all of his insulin! He has increased urination, weight loss, and nausea.  No blurry vision.  Reviewed HbA1c: Lab Results  Component Value Date   HGBA1C 9.1 (A) 01/21/2022   HGBA1C 9.7 (A) 09/03/2021   HGBA1C 10.3 (A) 07/09/2021   HGBA1C 9.6 (H) 04/28/2021   HGBA1C 10.3 (H) 01/28/2021   HGBA1C 11.2 (H) 10/28/2020   HGBA1C 12.1 (H) 09/23/2020   HGBA1C 6.1 (H) 05/13/2020   HGBA1C 8.0 (H) 12/19/2019   HGBA1C 8.4 (H) 09/20/2019   At last visit he was on: - NPH 20 units in a.m. and 10 units at night >> now after hospitalization: NPH 70/30 24 in am and 12 before dinner He was on Metformin >> AP. He tried [place] >> AP.  We changed to: - Tresiba 20 units daily  - Lyumjev 5-8 units before the 3 meals - Sliding scale for Lyumjev: - 150-175: + 1 unit  - 176-200: + 2 units  - 201-225: + 3 units  - 226-250: + 4 units  - 251-275: + 5 units - 276-300: + 6 units   However, at this visit, he is not taking any insulin...  Pt checks his sugars more than 4 times a day with his CGM:   Previously: - am: 126, 258, 305 - 2h after b'fast: n/c - before lunch: n/c - 2h after lunch: 242,  262 - before dinner: 93-110 - 2h after dinner: 255, 276 - bedtime: n/c - nighttime: n/c Lowest sugar was 67 >> 50s; he has hypoglycemia awareness at 100.  Highest sugar was 300s >> HI.  Glucometer: Accu-Chek guide  Pt's meals are: - Breakfast: skips or protein shake + waffle/ pancakes - Lunch: skips or potatoes, meat  - Dinner: meat + potatoes - Snacks: rarely, popcorn  - no CKD, last BUN/creatinine:  Lab Results  Component Value Date   BUN 8 03/18/2022   BUN 10 02/23/2022   CREATININE 0.47 (L) 03/18/2022   CREATININE 0.53 (L) 02/23/2022  On Cozaar 50 mg daily.  -+ HL; last set of lipids: Lab Results  Component Value Date   CHOL 174 04/28/2021   HDL 70 04/28/2021   LDLCALC 89 04/28/2021   TRIG 78 04/28/2021   CHOLHDL 2.5 04/28/2021  He is on Lipitor 20 mg daily  - last eye exam was in 2021. No DR reportedly. + cataract.  - + numbness and tingling in his feet.    He also has a history of GERD, Barrett's esophagus, hepatitis C, depression  ROS: + see HPI Past Medical History:  Diagnosis Date   Barrett's esophagus dx 2016   Bronchitis    Chronic hepatitis C without hepatic coma ([place]) 10/31/2014   Depression  Diabetes [facility])    ED (erectile dysfunction)    GERD (gastroesophageal reflux disease)    Hepatitis C    Hiatal hernia 10/2014   3cm   Neuropathy    pancreatic ca 12/2020   S/P transmetatarsal amputation of foot, left ([place]) 11/28/2020   Past Surgical History:  Procedure Laterality Date   AMPUTATION Left 11/15/2020   Procedure: LEFT TRANSMETATARSALS AMPUTATION;  Surgeon: Newt Minion, MD;  Location: [facility];  Service: Orthopedics;  Laterality: Left;   APPENDECTOMY     BILIARY STENT PLACEMENT N/A 02/27/2021   Procedure: BILIARY STENT PLACEMENT;  Surgeon: Milus Banister, MD;  Location: [facility];  Service: Endoscopy;  Laterality: N/A;   BILIARY STENT PLACEMENT N/A 03/27/2021   Procedure: BILIARY STENT PLACEMENT;  Surgeon: Jackquline Denmark, MD;   Location: [facility];  Service: Endoscopy;  Laterality: N/A;   BIOPSY  03/27/2021   Procedure: BIOPSY;  Surgeon: Jackquline Denmark, MD;  [facility]: [facility];  Service: Endoscopy;;   COLONOSCOPY N/A 02/16/2014   Procedure: COLONOSCOPY;  Surgeon: Gatha Mayer, MD;  [facility]: [facility];  Service: Endoscopy;  Laterality: N/A;   COLONOSCOPY     ENDOSCOPIC RETROGRADE CHOLANGIOPANCREATOGRAPHY (ERCP) WITH PROPOFOL N/A 02/27/2021   Procedure: ENDOSCOPIC RETROGRADE CHOLANGIOPANCREATOGRAPHY (ERCP) WITH PROPOFOL;  Surgeon: Milus Banister, MD;  [facility];  Service: Endoscopy;  Laterality: N/A;   ERCP N/A 03/27/2021   Procedure: ENDOSCOPIC RETROGRADE CHOLANGIOPANCREATOGRAPHY (ERCP);  Surgeon: Jackquline Denmark, MD;  Location[facility];  Service: Endoscopy;  Laterality: N/A;   ESOPHAGOGASTRODUODENOSCOPY (EGD) WITH PROPOFOL N/A 05/06/2021   Procedure: ESOPHAGOGASTRODUODENOSCOPY (EGD) WITH PROPOFOL;  Surgeon: Mauri Pole, MD;  [facility];  Service: Endoscopy;  Laterality: N/A;   EUS N/A 02/27/2021   Procedure: UPPER ENDOSCOPIC ULTRASOUND (EUS) RADIAL;  Surgeon: Milus Banister, MD;  [facility];  Service: Endoscopy;  Laterality: N/A;   FINE NEEDLE ASPIRATION N/A 02/27/2021   Procedure: FINE NEEDLE ASPIRATION (FNA) LINEAR;  Surgeon: Milus Banister, MD;  [facility];  Service: Endoscopy;  Laterality: N/A;   IR IMAGING GUIDED PORT INSERTION  03/21/2021   SPHINCTEROTOMY  02/27/2021   Procedure: SPHINCTEROTOMY;  Surgeon: Milus Banister, MD;  [facility];  Service: Endoscopy;;   STENT REMOVAL  03/27/2021   Procedure: STENT REMOVAL;  Surgeon: Jackquline Denmark, MD;  [facility];  Service: Endoscopy;;   TONSILLECTOMY     Social History   Socioeconomic History   Marital status: Single    Spouse name: Not on file   Number of children: Not on file   Years of education: Not on file   Highest education level: Not on file  Occupational  History   Not on file  Tobacco Use   Smoking status: Every Day    Packs/day: 0.50    Years: 35.00    Total pack years: 17.50    Types: Cigarettes   Smokeless tobacco: Never   Tobacco comments:    Currently being followed by the smoking cessation program   Vaping Use   Vaping Use: Never used  Substance and Sexual Activity   Alcohol use: Not Currently    Comment: previous   Drug use: No   Sexual activity: Not on file  Other Topics Concern   Not on file  Social History Narrative   Not on file   Social Determinants of Health   Financial Resource Strain: High Risk (03/13/2021)   Overall Financial Resource Strain (CARDIA)    Difficulty of Paying Living Expenses:  Very hard  Food Insecurity: Food Insecurity Present (05/15/2021)   Hunger Vital Sign    Worried About Running Out of Food in the Last Year: Sometimes true    Ran Out of Food in the Last Year: Sometimes true  Transportation Needs: Unmet Transportation Needs (03/13/2021)   PRAPARE - Hydrologist (Medical): Yes    Lack of Transportation (Non-Medical): Yes  Physical Activity: Not on file  Stress: Not on file  Social Connections: Not on file  Intimate Partner Violence: Not At Risk (12/02/2021)   Humiliation, Afraid, Rape, and Kick questionnaire    Fear of Current or Ex-Partner: No    Emotionally Abused: No    Physically Abused: No    Sexually Abused: No   Current Outpatient Medications on File Prior to Visit  Medication Sig Dispense Refill   Accu-Chek Softclix Lancets lancets Check blood glucose level by fingerstick 3-4 times per day. 100 each 3   acetaminophen (TYLENOL) 500 MG tablet Take 1 tablet (500 mg total) by mouth every 6 (six) hours as needed for moderate pain. 60 tablet 3   albuterol (VENTOLIN HFA) 108 (90 Base) MCG/ACT inhaler Inhale 2 puffs into the lungs every 6 (six) hours as needed for wheezing or shortness of breath. 18 g 0   aspirin EC 81 MG tablet Take 1 tablet by mouth daily.  100 tablet 0   atorvastatin (LIPITOR) 20 MG tablet Take 1 tablet by mouth daily. 90 tablet 0   capecitabine (XELODA) 500 MG tablet Take 3 tablets (1,500 mg total) by mouth 2 (two) times daily after a meal. 60 tablet 0   Continuous Blood Gluc Sensor (FREESTYLE LIBRE 2 SENSOR) MISC Use as directed. 1 each 6   diphenoxylate-atropine (LOMOTIL) 2.5-0.025 MG tablet Take 1 tablet by mouth 4 (four) times daily as needed for diarrhea or loose stools. 60 tablet 1   DULoxetine (CYMBALTA) 60 MG capsule Take 1 capsule (60 mg total) by mouth daily. 90 capsule 1   EPINEPHrine 0.3 mg/0.3 mL IJ SOAJ injection Inject 0.3 mg into the muscle once as needed for anaphylaxis.     folic acid (FOLVITE) 1 MG tablet Take 1 tablet (1 mg total) by mouth in the morning.     glucose blood (ACCU-CHEK GUIDE) test strip Check blood glucose level by fingerstick 3 - 4 times per day. 100 each 12   insulin degludec (TRESIBA FLEXTOUCH) 100 UNIT/ML FlexTouch Pen Inject 24 Units into the skin daily. (Patient not taking: Reported on 03/18/2022) 9 mL 3   Insulin Lispro-aabc (LYUMJEV KWIKPEN) 100 UNIT/ML KwikPen Inject 5-8 Units into the skin 3 (three) times daily before meals. 15 mL 3   Insulin Pen Needle (TRUEPLUS PEN NEEDLES) 32G X 4 MM MISC Use as instructed. Inject into the skin twice daily 200 each 6   lidocaine-prilocaine (EMLA) cream Apply topically as directed when needed. 30 g 0   lipase/protease/amylase (CREON) 36000 UNITS CPEP capsule Take 1 capsule (36,000 Units total) by mouth 3 (three) times daily with meals. May take 1 additional capsule with snacks 270 capsule 3   mirtazapine (REMERON) 30 MG tablet Take 1 tablet (30 mg total) by mouth at bedtime. 90 tablet 0   Multiple Vitamins-Minerals (THEREMS-M) TABS Take 1 tablet by mouth in the morning. 90 tablet 3   mupirocin ointment (BACTROBAN) 2 % Apply to wound surface with each dressing change. 22 g 0   nicotine (NICODERM CQ - DOSED IN MG/24 HOURS) 14 mg/24hr patch Apply one patch  daily x 12 weeks. 84 patch 1   ondansetron (ZOFRAN) 8 MG tablet Take 1 tablet (8 mg total) by mouth 2 (two) times daily as needed. Start on day 3 after chemotherapy. 30 tablet 1   oxyCODONE (OXY IR/ROXICODONE) 5 MG immediate release tablet Take 1 to 2 tablets by mouth every 6 hours as needed for severe pain. 120 tablet 0   oxyCODONE ER (XTAMPZA ER) 9 MG C12A Take 1 capsule by mouth every 12 hours. 60 capsule 0   pantoprazole (PROTONIX) 40 MG tablet Take 1 tablet by mouth daily. 90 tablet 0   polyethylene glycol powder (GLYCOLAX/MIRALAX) 17 GM/SCOOP powder Take 17 g by mouth at bedtime. 238 g 0   prochlorperazine (COMPAZINE) 10 MG tablet Take 1 tablet (10 mg total) by mouth every 6 (six) hours as needed for nausea or vomiting. 30 tablet 1   varenicline (APO-VARENICLINE) 1 MG tablet Take 1 tablet by mouth once daily in the morning with food 84 tablet 1   zolpidem (AMBIEN) 10 MG tablet Take 1 tablet (10 mg total) by mouth at bedtime as needed for sleep. 30 tablet 1   No current facility-administered medications on file prior to visit.   Allergies  Allergen Reactions   Bee Venom Anaphylaxis   Lactose Intolerance (Gi) Other (See Comments)    GI upset   Family History  Problem Relation Age of Onset   Breast cancer Mother    Stroke Father    Alcohol abuse Father    Heart disease Maternal Grandfather    Pancreatic cancer Paternal Grandmother    Diabetes Paternal Grandfather    Colon cancer Neg Hx    Stomach cancer Neg Hx    Rectal cancer Neg Hx    Esophageal cancer Neg Hx    PE: BP (!) 144/90 (BP Location: Right Arm, Patient Position: Sitting, Cuff Size: Normal)   Pulse 94   Ht '5\' 11"'$  (1.803 m)   Wt 121 lb 12.8 oz (55.2 kg)   SpO2 97%   BMI 16.99 kg/m  Wt Readings from Last 3 Encounters:  04/03/22 121 lb 12.8 oz (55.2 kg)  02/23/22 124 lb 9.6 oz (56.5 kg)  02/09/22 125 lb 8 oz (56.9 kg)   Constitutional: Cachectic, in NAD Eyes: EOMI, no exophthalmos ENT: moist mucous membranes,  no thyromegaly, no cervical lymphadenopathy Cardiovascular: Tachycardia, RR, No MRG, + B LE pitting edema Respiratory: CTA B Musculoskeletal: no deformities Skin: moist, warm, + erythema on bilateral legs Neurological: no tremor with outstretched hands  ASSESSMENT: 1. DM2, insulin-dependent, uncontrolled, with complications -Gastroparesis -Peripheral neuropathy -Left transmetatarsal amputation -ED  2. HL  PLAN:  1. Patient with longstanding, uncontrolled, type 2 diabetes, on insulin regimen, with still poor control at last visit.  At that time, he was on Novolin 70/30 and I did not feel that this was flexible enough for him.  We changed to [place] and Lyumjev.  I advised him how to vary the Lyumjev dose based on the size and consistency of the meal.  He had a freestyle libre 2 CGM with him and I applied this on his arm. We discussed about always checking with his glucometer if he is trying to collect a low blood sugar and we also discussed about the delay between blood sugar and interstitial sugar changes.  HbA1c at last visit was 9.1%, lower, but still above target. CGM interpretation: -At today's visit, we reviewed his CGM downloads: It appears that 80% of values are in target range (goal >70%), while  91% are higher than 180 (goal <25%), and 1% are lower than 70 (goal <4%).  The calculated average blood sugar is 259.  The projected HbA1c for the next 3 months ([facility]MI) is 9.5%. -Reviewing the CGM trends, his sugars are extremely high, almost all above the target range, more stable overnight and peaking midday.  He tells me that his blood sugars were dropping overnight after switching to the recommended regimen at last visit and instead of adjusting the dose of Tresiba down or contacting me, he stopped all of his insulins.  I advised him that this is dangerous and discussed possible side effects of this including weight loss, increased urination, nausea, and poor wound healing.  I advised him that  if this happens again, he definitely needs to contact me.  He mentions that the sugars were not dropping after Lyumjev, only during the night.  Therefore, at today's visit, we discussed about starting [place] again, but at a lower dose.  We can restart Lyumjev at the previous doses.  Discussed about how to titrate the [place] dose if needed. -At today's visit, I sent a new prescription for his CGM as he had problems obtaining the previous prescription, which was sent by PCP - I suggested to:  Patient Instructions  Start back on: - Tresiba 12 units daily (can adjust by 2 units every 2-3 days as needed) - Lyumjev: - 5-8 units before the 3 meals - Sliding scale for Lyumjev: - 150-175: + 1 unit  - 176-200: + 2 units  - 201-225: + 3 units  - 226-250: + 4 units  - 251-275: + 5 units - >275: + 6 units   Please return in 3 months.  - we checked his HbA1c: 8.7% (lower) - advised to check sugars at different times of the day - 4 hours x a day, rotating check times - advised for yearly eye exams >> he is UTD - return to clinic in 3 months  2. HL -Reviewed latest lipid panel from 03/2021: LDL above goal goal of less than 70, the rest the fractions at goal: Lab Results  Component Value Date   CHOL 174 04/28/2021   HDL 70 04/28/2021   LDLCALC 89 04/28/2021   TRIG 78 04/28/2021   CHOLHDL 2.5 04/28/2021  -We will continue Lipitor 10 mg daily.  No side effects from this.  Philemon Kingdom, MD PhD [facility] Endocrinology

## 2022-04-03 NOTE — Telephone Encounter (Signed)
Patient Advocate Encounter   Received notification from CoverMyMeds that prior authorization is required for [facility] 2 Sensor  Submitted: 04/03/22 Key [id number] Status is pending  Clista Bernhardt, CPhT Rx Patient Advocate Specialist Phone: [phone]

## 2022-04-03 NOTE — Patient Instructions (Addendum)
Start back on: - Tresiba 12 units daily (can adjust by 2 units every 2-3 days as needed) - Lyumjev: - 5-8 units before the 3 meals - Sliding scale for Lyumjev: - 150-175: + 1 unit  - 176-200: + 2 units  - 201-225: + 3 units  - 226-250: + 4 units  - 251-275: + 5 units - >275: + 6 units   Please return in 3 months.

## 2022-04-06 ENCOUNTER — Other Ambulatory Visit: Payer: Self-pay

## 2022-04-06 ENCOUNTER — Ambulatory Visit
Admission: RE | Admit: 2022-04-06 | Discharge: 2022-04-06 | Disposition: A | Discharge status: Home / Self Care | Payer: Medicaid Other | Source: Ambulatory Visit | Attending: Radiation Oncology | Admitting: Radiation Oncology

## 2022-04-06 DIAGNOSIS — Z510 Encounter for antineoplastic radiation therapy: Secondary | ICD-10-CM | POA: Diagnosis not present

## 2022-04-06 LAB — RAD ONC ARIA SESSION SUMMARY
Course Elapsed Days: 47
Plan Fractions Treated to Date: 25
Plan Prescribed Dose Per Fraction: 1.8 Gy
Plan Total Fractions Prescribed: 25
Plan Total Prescribed Dose: 45 Gy
Reference Point Dosage Given to Date: 45 Gy
Reference Point Session Dosage Given: 1.8 Gy
Session Number: 25

## 2022-04-07 ENCOUNTER — Other Ambulatory Visit: Payer: Self-pay

## 2022-04-07 ENCOUNTER — Inpatient Hospital Stay: Payer: Medicaid Other

## 2022-04-07 ENCOUNTER — Encounter: Payer: Self-pay | Admitting: Hematology and Oncology

## 2022-04-07 ENCOUNTER — Other Ambulatory Visit (HOSPITAL_COMMUNITY): Payer: Self-pay

## 2022-04-07 ENCOUNTER — Ambulatory Visit
Admission: RE | Admit: 2022-04-07 | Discharge: 2022-04-07 | Disposition: A | Discharge status: Home / Self Care | Payer: Medicaid Other | Source: Ambulatory Visit | Attending: Radiation Oncology | Admitting: Radiation Oncology

## 2022-04-07 ENCOUNTER — Inpatient Hospital Stay: Payer: Medicaid Other | Attending: Hematology and Oncology | Admitting: Hematology and Oncology

## 2022-04-07 ENCOUNTER — Other Ambulatory Visit: Payer: Self-pay | Admitting: Hematology and Oncology

## 2022-04-07 VITALS — BP 146/92 | HR 109 | Temp 97.8°F | Resp 18 | Wt 119.0 lb

## 2022-04-07 DIAGNOSIS — Z95828 Presence of other vascular implants and grafts: Secondary | ICD-10-CM

## 2022-04-07 DIAGNOSIS — K8689 Other specified diseases of pancreas: Secondary | ICD-10-CM | POA: Diagnosis not present

## 2022-04-07 DIAGNOSIS — Z79899 Other long term (current) drug therapy: Secondary | ICD-10-CM | POA: Insufficient documentation

## 2022-04-07 DIAGNOSIS — F17210 Nicotine dependence, cigarettes, uncomplicated: Secondary | ICD-10-CM | POA: Insufficient documentation

## 2022-04-07 DIAGNOSIS — C250 Malignant neoplasm of head of pancreas: Secondary | ICD-10-CM | POA: Insufficient documentation

## 2022-04-07 DIAGNOSIS — D6959 Other secondary thrombocytopenia: Secondary | ICD-10-CM | POA: Insufficient documentation

## 2022-04-07 DIAGNOSIS — T451X5A Adverse effect of antineoplastic and immunosuppressive drugs, initial encounter: Secondary | ICD-10-CM | POA: Insufficient documentation

## 2022-04-07 DIAGNOSIS — Z923 Personal history of irradiation: Secondary | ICD-10-CM | POA: Insufficient documentation

## 2022-04-07 DIAGNOSIS — Z794 Long term (current) use of insulin: Secondary | ICD-10-CM | POA: Insufficient documentation

## 2022-04-07 DIAGNOSIS — E1165 Type 2 diabetes mellitus with hyperglycemia: Secondary | ICD-10-CM | POA: Insufficient documentation

## 2022-04-07 DIAGNOSIS — R1110 Vomiting, unspecified: Secondary | ICD-10-CM | POA: Insufficient documentation

## 2022-04-07 DIAGNOSIS — Z510 Encounter for antineoplastic radiation therapy: Secondary | ICD-10-CM | POA: Diagnosis not present

## 2022-04-07 DIAGNOSIS — D6481 Anemia due to antineoplastic chemotherapy: Secondary | ICD-10-CM | POA: Insufficient documentation

## 2022-04-07 DIAGNOSIS — Z7982 Long term (current) use of aspirin: Secondary | ICD-10-CM | POA: Insufficient documentation

## 2022-04-07 LAB — CBC WITH DIFFERENTIAL (CANCER CENTER ONLY)
Abs Immature Granulocytes: 0.04 10*3/uL (ref 0.00–0.07)
Basophils Absolute: 0 10*3/uL (ref 0.0–0.1)
Basophils Relative: 0 %
Eosinophils Absolute: 0.1 10*3/uL (ref 0.0–0.5)
Eosinophils Relative: 1 %
HCT: 35.6 % — ABNORMAL LOW (ref 39.0–52.0)
Hemoglobin: 12.5 g/dL — ABNORMAL LOW (ref 13.0–17.0)
Immature Granulocytes: 1 %
Lymphocytes Relative: 2 %
Lymphs Abs: 0.1 10*3/uL — ABNORMAL LOW (ref 0.7–4.0)
MCH: 32 pg (ref 26.0–34.0)
MCHC: 35.1 g/dL (ref 30.0–36.0)
MCV: 91 fL (ref 80.0–100.0)
Monocytes Absolute: 0.8 10*3/uL (ref 0.1–1.0)
Monocytes Relative: 12 %
Neutro Abs: 5.8 10*3/uL (ref 1.7–7.7)
Neutrophils Relative %: 84 %
Platelet Count: 144 10*3/uL — ABNORMAL LOW (ref 150–400)
RBC: 3.91 MIL/uL — ABNORMAL LOW (ref 4.22–5.81)
RDW: 24.7 % — ABNORMAL HIGH (ref 11.5–15.5)
WBC Count: 6.9 10*3/uL (ref 4.0–10.5)
nRBC: 0 % (ref 0.0–0.2)

## 2022-04-07 LAB — CMP (CANCER CENTER ONLY)
ALT: 12 U/L (ref 0–44)
AST: 24 U/L (ref 15–41)
Albumin: 3.4 g/dL — ABNORMAL LOW (ref 3.5–5.0)
Alkaline Phosphatase: 162 U/L — ABNORMAL HIGH (ref 38–126)
Anion gap: 5 (ref 5–15)
BUN: 8 mg/dL (ref 6–20)
CO2: 29 mmol/L (ref 22–32)
Calcium: 8.3 mg/dL — ABNORMAL LOW (ref 8.9–10.3)
Chloride: 100 mmol/L (ref 98–111)
Creatinine: 0.54 mg/dL — ABNORMAL LOW (ref 0.61–1.24)
GFR, Estimated: >60 mL/min (ref >60)
Glucose, Bld: 378 mg/dL — ABNORMAL HIGH (ref 70–99)
Potassium: 4.1 mmol/L (ref 3.5–5.1)
Sodium: 134 mmol/L — ABNORMAL LOW (ref 135–145)
Total Bilirubin: 0.5 mg/dL (ref 0.3–1.2)
Total Protein: 7.4 g/dL (ref 6.5–8.1)

## 2022-04-07 LAB — RAD ONC ARIA SESSION SUMMARY
Course Elapsed Days: 48
Plan Fractions Treated to Date: 1
Plan Prescribed Dose Per Fraction: 1.8 Gy
Plan Total Fractions Prescribed: 3
Plan Total Prescribed Dose: 5.4 Gy
Reference Point Dosage Given to Date: 46.8 Gy
Reference Point Session Dosage Given: 1.8 Gy
Session Number: 26

## 2022-04-07 MED ORDER — OXYCODONE HCL 5 MG PO TABS
5.0000 mg | ORAL_TABLET | Freq: Four times a day (QID) | ORAL | 0 refills | Status: DC | PRN
Start: 2022-04-07 — End: 2022-04-28
  Filled 2022-04-07: qty 120, 15d supply, fill #0

## 2022-04-07 MED ORDER — CAPECITABINE 500 MG PO TABS: 1500.0000 mg | ORAL_TABLET | Freq: Two times a day (BID) | ORAL | 0 refills | Status: DC

## 2022-04-07 MED ORDER — HEPARIN SOD (PORK) LOCK FLUSH 100 UNIT/ML IV SOLN
500.0000 [IU] | Freq: Once | INTRAVENOUS | Status: AC
  Administered 2022-04-07: 500 [IU]

## 2022-04-07 MED ORDER — SODIUM CHLORIDE 0.9% FLUSH
10.0000 mL | Freq: Once | INTRAVENOUS | Status: AC
  Administered 2022-04-07: 10 mL

## 2022-04-07 NOTE — Progress Notes (Signed)
[place] [phone]   [phone]  PROGRESS NOTE  Patient Care Team: [name], NP as PCP - General (Nurse Practitioner) Werner Lean, MD as PCP - Cardiology (Cardiology) Armbruster, Carlota Raspberry, MD as Consulting Physician (Gastroenterology) Orson Slick, MD as Consulting Physician (Oncology) Royston Bake, RN as Registered Nurse  Hematological/Oncological History # Adenocarcinoma of the Pancreas, Stage IIB 02/26/2021: patient seen by GI for jaundice and pancreatic mass causing CBD obstruction. CT abdomen showed pancreatic head mass (4.4 x 3.7 cm) and enlarged portocaval lymph nodes. 02/27/2021: ERCP with placement of a nonmetallic biliary stent. FNA of pancreatic head showed malignant cells consistent with pancreatic adenocarcinoma. 03/05/2021: CT Chest showed no evidence of metastatic disease in the chest. 03/12/2021: establish care with Dr. Lorenso Courier 04/10/2021: Cycle 1 Day 1 of FOLFIRINOX 04/24/2021: Cycle 2 Day 1 of FOLFIRINOX 05/22/2021: Cycle 3 Day 1 of FOLFIRINOX. Delayed x 2 weeks due to hospitalization for poor po intake and dehydration due to gastroparesis. 06/05/2021: Cycle 4 Day 1 of FOLFIRINOX 06/19/2021: Cycle 5 Day 1 of FOLFIRINOX 07/02/2021: Cycle 6 Day 1 of FOLFIRINOX 07/16/2021: Cycle 7 Day 1 of FOLFIRINOX  07/30/2021: Cycle 8 Day 1 of FOLFIRINOX  08/13/2021: Cycle 9 Day 1 of FOLFIRINOX  08/27/2021:  Cycle 10 Day 1 of FOLFIRINOX  09/10/2021: Cycle 11 Day 1 of FOLFIRINOX  09/24/2021: Cycle 12 Day 1 of FOLFIRINOX  10/28/2021: Underwent open whipple with portal vein reconstruction utilizing a R deep FV graft at [facility]. Positive margins noted.  02/18/2022: Start of Chemoradiation with Xeloda 850 mg/m2 on radiation days (5 days per week)   Interval History:  [name] [name] 61 y.o. male returns for a follow up visit for pancreatic cancer. He was last seen on 03/18/2022. In the interim since his last visit he has continued  chemoradiation.   On exam today [name] [name] is unaccompanied.  He reports he has had a rough time in the interim since her last visit.  He has missed a few episodes of radiation and therefore his last day of radiation is now 04/09/2022.  He reports that he has been having diarrhea today and when he has diarrhea his "bowels hurt".  He notes that he is also developed some mouth sores and his appetite is been quite poor over the past 4 to 5 days.  He is also having some vomiting in the mornings.  He notes that his foot wound is currently healing better.  He has had some unsteadiness on his feet and fell Reina Fuse recently and skinned his knee.  He notes that he is taking his Lomotil but that he continues to produce loose bowel movements.  He notes he has been doing his best to try to take in fluid and keep up with hydration.  He notes he does drink water and Gatorade.  He otherwise denies any fevers, chills, sweats, constipation, or redness of his hands or palms.  A full 10 point ROS is listed below.  MEDICAL HISTORY:  Past Medical History:  Diagnosis Date   Barrett's esophagus dx 2016   Bronchitis    Chronic hepatitis C without hepatic coma ([place]) 10/31/2014   Depression    Diabetes ([place])    ED (erectile dysfunction)    GERD (gastroesophageal reflux disease)    Hepatitis C    Hiatal hernia 10/2014   3cm   Neuropathy    pancreatic ca 12/2020   S/P transmetatarsal amputation of foot, left ([place]) 11/28/2020  SURGICAL HISTORY: Past Surgical History:  Procedure Laterality Date   AMPUTATION Left 11/15/2020   Procedure: LEFT TRANSMETATARSALS AMPUTATION;  Surgeon: Newt Minion, MD;  Location: [facility];  Service: Orthopedics;  Laterality: Left;   APPENDECTOMY     BILIARY STENT PLACEMENT N/A 02/27/2021   Procedure: BILIARY STENT PLACEMENT;  Surgeon: Milus Banister, MD;  [facility];  Service: Endoscopy;  Laterality: N/A;   BILIARY STENT PLACEMENT N/A 03/27/2021   Procedure: BILIARY STENT  PLACEMENT;  Surgeon: Jackquline Denmark, MD;  [facility];  Service: Endoscopy;  Laterality: N/A;   BIOPSY  03/27/2021   Procedure: BIOPSY;  Surgeon: Jackquline Denmark, MD;  [facility];  Service: Endoscopy;;   COLONOSCOPY N/A 02/16/2014   Procedure: COLONOSCOPY;  Surgeon: Gatha Mayer, MD;  [facility];  Service: Endoscopy;  Laterality: N/A;   COLONOSCOPY     ENDOSCOPIC RETROGRADE CHOLANGIOPANCREATOGRAPHY (ERCP) WITH PROPOFOL N/A 02/27/2021   Procedure: ENDOSCOPIC RETROGRADE CHOLANGIOPANCREATOGRAPHY (ERCP) WITH PROPOFOL;  Surgeon: Milus Banister, MD;  [facility];  Service: Endoscopy;  Laterality: N/A;   ERCP N/A 03/27/2021   Procedure: ENDOSCOPIC RETROGRADE CHOLANGIOPANCREATOGRAPHY (ERCP);  Surgeon: Jackquline Denmark, MD;  [facility];  Service: Endoscopy;  Laterality: N/A;   ESOPHAGOGASTRODUODENOSCOPY (EGD) WITH PROPOFOL N/A 05/06/2021   Procedure: ESOPHAGOGASTRODUODENOSCOPY (EGD) WITH PROPOFOL;  Surgeon: Mauri Pole, MD;  [facility];  Service: Endoscopy;  Laterality: N/A;   EUS N/A 02/27/2021   Procedure: UPPER ENDOSCOPIC ULTRASOUND (EUS) RADIAL;  Surgeon: Milus Banister, MD;  [facility];  Service: Endoscopy;  Laterality: N/A;   FINE NEEDLE ASPIRATION N/A 02/27/2021   Procedure: FINE NEEDLE ASPIRATION (FNA) LINEAR;  Surgeon: Milus Banister, MD;  [facility];  Service: Endoscopy;  Laterality: N/A;   IR IMAGING GUIDED PORT INSERTION  03/21/2021   SPHINCTEROTOMY  02/27/2021   Procedure: SPHINCTEROTOMY;  Surgeon: Milus Banister, MD;  [facility];  Service: Endoscopy;;   STENT REMOVAL  03/27/2021   Procedure: STENT REMOVAL;  Surgeon: Jackquline Denmark, MD;  [facility];  Service: Endoscopy;;   TONSILLECTOMY      SOCIAL HISTORY: Social History   Socioeconomic History   Marital status: Single    Spouse name: Not on file   Number of children: Not on file   Years of education: Not on file    Highest education level: Not on file  Occupational History   Not on file  Tobacco Use   Smoking status: Every Day    Packs/day: 0.50    Years: 35.00    Total pack years: 17.50    Types: Cigarettes   Smokeless tobacco: Never   Tobacco comments:    Currently being followed by the smoking cessation program   Vaping Use   Vaping Use: Never used  Substance and Sexual Activity   Alcohol use: Not Currently    Comment: previous   Drug use: No   Sexual activity: Not on file  Other Topics Concern   Not on file  Social History Narrative   Not on file   Social Determinants of Health   Financial Resource Strain: High Risk (03/13/2021)   Overall Financial Resource Strain (CARDIA)    Difficulty of Paying Living Expenses: Very hard  Food Insecurity: Food Insecurity Present (05/15/2021)   Hunger Vital Sign    Worried About Running Out of Food in the Last Year: Sometimes true    Ran Out of Food in the Last Year: Sometimes true  Transportation Needs:  Unmet Transportation Needs (03/13/2021)   PRAPARE - Hydrologist (Medical): Yes    Lack of Transportation (Non-Medical): Yes  Physical Activity: Not on file  Stress: Not on file  Social Connections: Not on file  Intimate Partner Violence: Not At Risk (12/02/2021)   Humiliation, Afraid, Rape, and Kick questionnaire    Fear of Current or Ex-Partner: No    Emotionally Abused: No    Physically Abused: No    Sexually Abused: No    FAMILY HISTORY: Family History  Problem Relation Age of Onset   Breast cancer Mother    Stroke Father    Alcohol abuse Father    Heart disease Maternal Grandfather    Pancreatic cancer Paternal Grandmother    Diabetes Paternal Grandfather    Colon cancer Neg Hx    Stomach cancer Neg Hx    Rectal cancer Neg Hx    Esophageal cancer Neg Hx     ALLERGIES:  is allergic to bee venom and lactose intolerance (gi).  MEDICATIONS:  Current Outpatient Medications  Medication Sig Dispense  Refill   Accu-Chek Softclix Lancets lancets Check blood glucose level by fingerstick 3-4 times per day. 100 each 3   acetaminophen (TYLENOL) 500 MG tablet Take 1 tablet (500 mg total) by mouth every 6 (six) hours as needed for moderate pain. 60 tablet 3   albuterol (VENTOLIN HFA) 108 (90 Base) MCG/ACT inhaler Inhale 2 puffs into the lungs every 6 (six) hours as needed for wheezing or shortness of breath. 18 g 0   aspirin EC 81 MG tablet Take 1 tablet by mouth daily. 100 tablet 0   atorvastatin (LIPITOR) 20 MG tablet Take 1 tablet by mouth daily. 90 tablet 0   capecitabine (XELODA) 500 MG tablet Take 3 tablets (1,500 mg total) by mouth 2 (two) times daily after a meal. 6 tablet 0   Continuous Blood Gluc Sensor (FREESTYLE LIBRE 2 SENSOR) MISC Use 1 sensor every 2 weeks as advised 6 each 3   diphenoxylate-atropine (LOMOTIL) 2.5-0.025 MG tablet Take 1 tablet by mouth 4 (four) times daily as needed for diarrhea or loose stools. 60 tablet 1   DULoxetine (CYMBALTA) 60 MG capsule Take 1 capsule (60 mg total) by mouth daily. 90 capsule 1   EPINEPHrine 0.3 mg/0.3 mL IJ SOAJ injection Inject 0.3 mg into the muscle once as needed for anaphylaxis.     folic acid (FOLVITE) 1 MG tablet Take 1 tablet (1 mg total) by mouth in the morning.     glucose blood (ACCU-CHEK GUIDE) test strip Check blood glucose level by fingerstick 3 - 4 times per day. 100 each 12   insulin degludec (TRESIBA FLEXTOUCH) 100 UNIT/ML FlexTouch Pen Inject 24 Units into the skin daily. (Patient not taking: Reported on 03/18/2022) 9 mL 3   Insulin Lispro-aabc (LYUMJEV KWIKPEN) 100 UNIT/ML KwikPen Inject 5-8 Units into the skin 3 (three) times daily before meals. (Patient not taking: Reported on 04/03/2022) 15 mL 3   Insulin Pen Needle (TRUEPLUS PEN NEEDLES) 32G X 4 MM MISC Use as instructed. Inject into the skin twice daily 200 each 6   lidocaine-prilocaine (EMLA) cream Apply topically as directed when needed. 30 g 0   lipase/protease/amylase  (CREON) 36000 UNITS CPEP capsule Take 1 capsule (36,000 Units total) by mouth 3 (three) times daily with meals. May take 1 additional capsule with snacks 270 capsule 3   mirtazapine (REMERON) 30 MG tablet Take 1 tablet (30 mg total) by mouth at  bedtime. 90 tablet 0   Multiple Vitamins-Minerals (THEREMS-M) TABS Take 1 tablet by mouth in the morning. 90 tablet 3   mupirocin ointment (BACTROBAN) 2 % Apply to wound surface with each dressing change. 22 g 0   nicotine (NICODERM CQ - DOSED IN MG/24 HOURS) 14 mg/24hr patch Apply one patch daily x 12 weeks. 84 patch 1   ondansetron (ZOFRAN) 8 MG tablet Take 1 tablet (8 mg total) by mouth 2 (two) times daily as needed. Start on day 3 after chemotherapy. 30 tablet 1   oxyCODONE (OXY IR/ROXICODONE) 5 MG immediate release tablet Take 1 to 2 tablets by mouth every 6 hours as needed for severe pain. 120 tablet 0   oxyCODONE ER (XTAMPZA ER) 9 MG C12A Take 1 capsule by mouth every 12 hours. 60 capsule 0   pantoprazole (PROTONIX) 40 MG tablet Take 1 tablet by mouth daily. 90 tablet 0   polyethylene glycol powder (GLYCOLAX/MIRALAX) 17 GM/SCOOP powder Take 17 g by mouth at bedtime. 238 g 0   prochlorperazine (COMPAZINE) 10 MG tablet Take 1 tablet (10 mg total) by mouth every 6 (six) hours as needed for nausea or vomiting. 30 tablet 1   varenicline (APO-VARENICLINE) 1 MG tablet Take 1 tablet by mouth once daily in the morning with food 84 tablet 1   zolpidem (AMBIEN) 10 MG tablet Take 1 tablet (10 mg total) by mouth at bedtime as needed for sleep. 30 tablet 1   No current facility-administered medications for this visit.    REVIEW OF SYSTEMS:   Constitutional: ( - ) fevers, ( - )  chills , ( - ) night sweats Eyes: ( - ) blurriness of vision, ( - ) double vision, ( - ) watery eyes Ears, nose, mouth, throat, and face: ( - ) mucositis, ( - ) sore throat Respiratory: ( - ) cough, ( - ) dyspnea, ( - ) wheezes Cardiovascular: ( - ) palpitation, ( - ) chest discomfort, (  - ) lower extremity swelling Gastrointestinal:  ( +) nausea, ( - ) heartburn, ( - ) change in bowel habits Skin: ( - ) abnormal skin rashes Lymphatics: ( - ) new lymphadenopathy, ( - ) easy bruising Neurological: ( - ) numbness, ( - ) tingling, ( - ) new weaknesses Behavioral/Psych: ( - ) mood change, ( - ) new changes  All other systems were reviewed with the patient and are negative.  PHYSICAL EXAMINATION: ECOG PERFORMANCE STATUS: 1 - Symptomatic but completely ambulatory  Vitals:   04/07/22 1037  BP: (!) 146/92  Pulse: (!) 109  Resp: 18  Temp: 97.8 F (36.6 C)  SpO2: 100%   Filed Weights   04/07/22 1037  Weight: 119 lb (54 kg)    GENERAL: Well-appearing middle-age Caucasian male, alert, no distress and comfortable HEAD: Anterior laceration of the scalp, well healed  SKIN: skin color, texture, turgor are normal, no rashes or significant lesions.  EYES: conjunctiva are pink and non-injected, sclera clear LUNGS: clear to auscultation and percussion with normal breathing effort HEART: regular rate & rhythm and no murmurs and no lower extremity edema ABDOMEN: Midline incision healing. Well healed lesion on right abdomen.  EXTREMITIES: wound currently dressed.  PSYCH: alert & oriented x 3, fluent speech NEURO: no focal motor/sensory deficits  LABORATORY DATA:  I have reviewed the data as listed    Latest Ref Rng & Units 04/07/2022   10:10 AM 03/18/2022   10:28 AM 02/23/2022    8:42 AM  CBC  WBC 4.0 - 10.5 K/uL 6.9  9.1  8.4   Hemoglobin 13.0 - 17.0 g/dL 12.5  11.1  10.0   Hematocrit 39.0 - 52.0 % 35.6  31.9  30.4   Platelets 150 - 400 K/uL 144  164  268        Latest Ref Rng & Units 04/07/2022   10:10 AM 03/18/2022   10:28 AM 02/23/2022    8:42 AM  CMP  Glucose 70 - 99 mg/dL 378  325  378   BUN 6 - 20 mg/dL '8  8  10   '$ Creatinine 0.61 - 1.24 mg/dL 0.54  0.47  0.53   Sodium 135 - 145 mmol/L 134  129  129   Potassium 3.5 - 5.1 mmol/L 4.1  4.5  4.1   Chloride 98 - 111  mmol/L 100  96  97   CO2 22 - 32 mmol/L '29  29  28   '$ Calcium 8.9 - 10.3 mg/dL 8.3  8.6  9.1   Total Protein 6.5 - 8.1 g/dL 7.4  6.9  7.5   Total Bilirubin 0.3 - 1.2 mg/dL 0.5  0.7  0.4   Alkaline Phos 38 - 126 U/L 162  145  183   AST 15 - 41 U/L '24  29  15   '$ ALT 0 - 44 U/L '12  12  12    '$ PATHOLOGY:  Procedure:    Pancreaticoduodenectomy (Whipple resection), partial pancreatectomy   TUMOR     Tumor Site:    Pancreatic head     Histologic Type:    Ductal adenocarcinoma (NOS)     Histologic Grade:    G2, moderately differentiated     Tumor Size:    Greatest Dimension (Centimeters): 3.7 cm       Additional Dimension (Centimeters):    2.6 cm       Additional Dimension (Centimeters):    2.1 cm     Site(s) Involved by Direct Tumor Extension:    Ampulla of Vater or sphincter of Oddi     Site(s) Involved by Direct Tumor Extension:    Duodenal wall     Site(s) Involved by Direct Tumor Extension:    Superior mesenteric vein     Treatment Effect:    Absent, with extensive residual cancer and no evident tumor regression (poor or no response, score 3)     Lymphovascular Invasion:    Present     Perineural Invasion:    Present   MARGINS     Margin Status for Invasive Carcinoma:    Invasive carcinoma present at margin       Margin(s) Involved by Invasive Carcinoma:    Pancreatic neck / parenchymal       Margin(s) Involved by Invasive Carcinoma:    Uncinate (retroperitoneal / superior mesenteric artery)     Margin Status for Dysplasia and Intraepithelial Neoplasia:    All margins negative for dysplasia and intraepithelial neoplasia   REGIONAL LYMPH NODES     Regional Lymph Node Status:           :    All regional lymph nodes negative for tumor       Number of Lymph Nodes Examined:    36   PATHOLOGIC STAGE CLASSIFICATION  (pTNM, AJCC 8th Edition)     Reporting of pT, pN, and (when applicable) pM categories is based on information available to the pathologist at the time the report is issued. As  per the AJCC (Chapter 1, 8th  Ed.) it is the managing physician's responsibility to establish the final pathologic stage based upon all pertinent information, including but potentially not limited to this pathology report.     TNM Descriptors:    y (post-treatment)     pT Category:    pT2     pN Category:    pN0   RADIOGRAPHIC STUDIES: I have personally reviewed the radiological images as listed and agreed with the findings in the report: Borderline resectable pancreatic mass with no evidence of metastatic disease. No results found.  ASSESSMENT & PLAN [name] [name] 61 y.o. male with medical history significant for borderline resectable pancreatic cancer presents for a follow up visit.   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  The patient does still have an elevation in bilirubin which likely represents a decline from the time of the latest stent placement.  He was evaluated by surgery who determined this is a borderline resectable tumor.  We will plan to start the patient on neoadjuvant chemotherapy.  The 2 options would be Gem/Abraxane or FOLFIRINOX.  Given his excellent functional status I do believe he would be a good candidate for FOLFIRINOX.  He completed 12 cycles of neoadjuvant FOLFIRINOX.  He underwent open whipple with portal vein reconstruction utilizing a R deep FV graft on 10/28/2021 at [facility].  Due to positive margins we will plan to treat with chemoradiation moving forward.  The treatment of choice will be Xeloda 850 mg/m2 on radiation days (5 days per week) over the course of radiation.  Currently radiation oncology intends for 5-1/2 weeks of treatment.  # Adenocarcinoma of the Pancreas, Stage IIB -- At this time disease appears borderline resectable.  We will proceed with neoadjuvant chemotherapy.  At this time would prefer a regimen of  FOLFIRINOX given his good baseline health. -- case reviewed by surgery, who agrees his disease is borderline resectable.  -- baseline elevations in both CA 19-9 and CEA. Continue to monitor during treatment.  -- Cycle 1 Day 1 of FOLFIRINOX started 04/10/2021. Cycle 12 Day 1 on 09/24/2021, completed neoadjuvant chemotherapy. --Underwent open whipple with portal vein reconstruction utilizing a R deep FV graft on 10/28/2021 at [facility]. Noted to have positive margins, adjuvant chemoradiation was recommended. Plan: --started chemoradiation with Xeloda on 02/18/2022 --continue Xeloda 850 mg/m2 on radiation days (5 days per week) over the course of radiation. Last treatment 04/09/2022.  --RTC in 2 weeks time to reassess   #Forehead laceration: --well healing, markedly improved.   #Post Surgical Infections-improving --WBC 6.9, Hgb 12.5 -- currently following with [facility] service.  --No evidence of infection at this time.  #Gastroparesis #Nausea/Vomiting/Poor PO Intake #Diarrhea --patient advanced diet to solid foods.  --nausea meds/antidiarrheal as below. --continue to work on glycemic control --continue to monitor.   #Chemotherapy induced Anemia and Thrombocytopenia --Hgb is stable at 12.5, stable. continue to monitor --Plt stable at 144 K --continue to monitor   # Pain Control -- Patient currently taking ibuprofen for his abdominal pain --continue oxycodone 10 mg every 6 hours as needed. He has consistently been taking '40mg'$  PO daily total. --continue Xtampza 9 mg q 12H   #Hyperglycemia: --today's glucose level is 378 --insulin-requiring type 2 DM --under the care of endocrinology  #Left foot wound: -- He has established care with wound care center -- Foot wound was observed today and does not appear to  be actively infected.  No erythema or tenderness.  No purulent discharge.  #Supportive Care -- chemotherapy education complete -- port placed -- zofran '8mg'$  q8H PRN and  compazine '10mg'$  PO q6H for nausea --Creon to help with loose stools. --lomotil q 6 H PRN for diarrhea.  -- EMLA cream for port  No orders of the defined types were placed in this encounter.  All questions were answered. The patient knows to call the clinic with any problems, questions or concerns.  I have spent a total of 30 minutes minutes of face-to-face and non-face-to-face time, preparing to see the patient,  performing a medically appropriate examination, counseling and educating the patient, ordering tests/procedures, documenting clinical information in the electronic health record and care coordination.   Ledell Peoples, MD Department of Hematology/Oncology [facility] Phone: [phone] Pager: [phone] Email: [email or web address]

## 2022-04-08 ENCOUNTER — Other Ambulatory Visit: Payer: Self-pay

## 2022-04-08 ENCOUNTER — Ambulatory Visit
Admission: RE | Admit: 2022-04-08 | Discharge: 2022-04-08 | Disposition: A | Discharge status: Home / Self Care | Payer: Medicaid Other | Source: Ambulatory Visit | Attending: Radiation Oncology | Admitting: Radiation Oncology

## 2022-04-08 ENCOUNTER — Other Ambulatory Visit: Payer: Self-pay | Admitting: Hematology and Oncology

## 2022-04-08 DIAGNOSIS — Z510 Encounter for antineoplastic radiation therapy: Secondary | ICD-10-CM | POA: Diagnosis not present

## 2022-04-08 LAB — RAD ONC ARIA SESSION SUMMARY
Course Elapsed Days: 49
Plan Fractions Treated to Date: 2
Plan Prescribed Dose Per Fraction: 1.8 Gy
Plan Total Fractions Prescribed: 3
Plan Total Prescribed Dose: 5.4 Gy
Reference Point Dosage Given to Date: 48.6 Gy
Reference Point Session Dosage Given: 1.8 Gy
Session Number: 27

## 2022-04-09 ENCOUNTER — Ambulatory Visit: Payer: Medicaid Other

## 2022-04-09 ENCOUNTER — Other Ambulatory Visit: Payer: Self-pay

## 2022-04-09 ENCOUNTER — Ambulatory Visit
Admission: RE | Admit: 2022-04-09 | Discharge: 2022-04-09 | Disposition: A | Discharge status: Home / Self Care | Payer: Medicaid Other | Source: Ambulatory Visit | Attending: Radiation Oncology | Admitting: Radiation Oncology

## 2022-04-09 ENCOUNTER — Encounter: Payer: Self-pay | Admitting: Radiation Oncology

## 2022-04-09 DIAGNOSIS — Z510 Encounter for antineoplastic radiation therapy: Secondary | ICD-10-CM | POA: Diagnosis not present

## 2022-04-09 LAB — RAD ONC ARIA SESSION SUMMARY
Course Elapsed Days: 50
Plan Fractions Treated to Date: 3
Plan Prescribed Dose Per Fraction: 1.8 Gy
Plan Total Fractions Prescribed: 3
Plan Total Prescribed Dose: 5.4 Gy
Reference Point Dosage Given to Date: 50.4 Gy
Reference Point Session Dosage Given: 1.8 Gy
Session Number: 28

## 2022-04-13 ENCOUNTER — Encounter (HOSPITAL_BASED_OUTPATIENT_CLINIC_OR_DEPARTMENT_OTHER): Payer: Medicaid Other | Attending: General Surgery | Admitting: General Surgery

## 2022-04-13 ENCOUNTER — Other Ambulatory Visit (HOSPITAL_COMMUNITY): Payer: Self-pay

## 2022-04-13 DIAGNOSIS — E11621 Type 2 diabetes mellitus with foot ulcer: Secondary | ICD-10-CM | POA: Diagnosis not present

## 2022-04-13 DIAGNOSIS — E1142 Type 2 diabetes mellitus with diabetic polyneuropathy: Secondary | ICD-10-CM | POA: Diagnosis not present

## 2022-04-13 DIAGNOSIS — L97528 Non-pressure chronic ulcer of other part of left foot with other specified severity: Secondary | ICD-10-CM | POA: Diagnosis not present

## 2022-04-13 DIAGNOSIS — L89152 Pressure ulcer of sacral region, stage 2: Secondary | ICD-10-CM | POA: Diagnosis not present

## 2022-04-13 NOTE — Telephone Encounter (Signed)
Patient Advocate Encounter  Prior Authorization for [employer] has been approved.   Confirmed via Refill Too Soon Rejection.  Determination letter/ effective dates have not been provided.  Clista Bernhardt, CPhT Rx Patient Advocate Specialist Phone: [phone]

## 2022-04-14 ENCOUNTER — Other Ambulatory Visit (HOSPITAL_COMMUNITY): Payer: Self-pay

## 2022-04-14 ENCOUNTER — Encounter: Payer: Self-pay | Admitting: Hematology and Oncology

## 2022-04-14 MED ORDER — NICOTINE 21 MG/24HR TD PT24
MEDICATED_PATCH | TRANSDERMAL | 1 refills | Status: DC
  Filled 2022-04-14: qty 28, 28d supply, fill #0
  Filled 2022-05-07: qty 28, 28d supply, fill #1

## 2022-04-14 NOTE — Progress Notes (Signed)
                                                                                                                                                            Patient Name: [name] [name] MRN: [id number] DOB: April 26, 1961 Referring Physician: Narda Rutherford Date of Service: 04/09/2022 [facility], [place]                                                        End Of Treatment Note  Diagnoses: C25.0-Malignant neoplasm of head of pancreas  Cancer Staging: Stage IIB, [id number] adenocarcinoma of the pancreatic head following neoadjuvant chemotherapy.  Intent: Curative  Radiation Treatment Dates: 02/18/2022 through 04/09/2022 Site Technique Total Dose (Gy) Dose per Fx (Gy) Completed Fx Beam Energies  Pancreas: Pancreas IMRT 45/45 1.8 25/25 6X  Pancreas: Pancreas_Bst IMRT 5.4/5.4 1.8 3/3 6X   Narrative: The patient tolerated radiation therapy relatively well. He developed fatigue and  nausea during therapy.   Plan: The patient will receive a call in about one month from the radiation oncology department. He will continue follow up with Dr. Lorenso Courier as well.   ________________________________________________    Carola Rhine, [facility]
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
## 2022-04-15 ENCOUNTER — Other Ambulatory Visit (HOSPITAL_COMMUNITY): Payer: Self-pay

## 2022-04-15 ENCOUNTER — Other Ambulatory Visit: Payer: Self-pay | Admitting: Hematology and Oncology

## 2022-04-15 ENCOUNTER — Encounter: Payer: Self-pay | Admitting: Hematology and Oncology

## 2022-04-15 ENCOUNTER — Other Ambulatory Visit: Payer: Self-pay | Admitting: Physician Assistant

## 2022-04-15 ENCOUNTER — Other Ambulatory Visit: Payer: Self-pay | Admitting: Nurse Practitioner

## 2022-04-15 DIAGNOSIS — F17200 Nicotine dependence, unspecified, uncomplicated: Secondary | ICD-10-CM

## 2022-04-15 DIAGNOSIS — C250 Malignant neoplasm of head of pancreas: Secondary | ICD-10-CM

## 2022-04-15 DIAGNOSIS — R195 Other fecal abnormalities: Secondary | ICD-10-CM

## 2022-04-15 MED ORDER — DIPHENOXYLATE-ATROPINE 2.5-0.025 MG PO TABS
1.0000 | ORAL_TABLET | Freq: Four times a day (QID) | ORAL | 1 refills | Status: DC | PRN
  Filled 2022-04-15: qty 20, 5d supply, fill #0
  Filled 2022-04-17: qty 40, 10d supply, fill #0
  Filled 2022-05-25: qty 60, 15d supply, fill #1

## 2022-04-15 MED ORDER — MUPIROCIN 2 % EX OINT
TOPICAL_OINTMENT | CUTANEOUS | 0 refills | Status: DC
  Filled 2022-04-15: qty 22, 10d supply, fill #0

## 2022-04-16 ENCOUNTER — Other Ambulatory Visit: Payer: Self-pay | Admitting: Physician Assistant

## 2022-04-16 ENCOUNTER — Other Ambulatory Visit (HOSPITAL_COMMUNITY): Payer: Self-pay

## 2022-04-16 ENCOUNTER — Encounter: Payer: Self-pay | Admitting: Hematology and Oncology

## 2022-04-16 MED ORDER — XTAMPZA ER 9 MG PO C12A
9.0000 mg | EXTENDED_RELEASE_CAPSULE | Freq: Two times a day (BID) | ORAL | 0 refills | Status: DC
  Filled 2022-04-16: qty 60, 30d supply, fill #0

## 2022-04-16 MED ORDER — ALBUTEROL SULFATE HFA 108 (90 BASE) MCG/ACT IN AERS
INHALATION_SPRAY | RESPIRATORY_TRACT | 0 refills | Status: DC
  Filled 2022-04-16: qty 18, 25d supply, fill #0

## 2022-04-16 MED ORDER — LIDOCAINE-PRILOCAINE 2.5-2.5 % EX CREA
1.0000 | TOPICAL_CREAM | CUTANEOUS | 0 refills | Status: DC | PRN
  Filled 2022-04-16: qty 30, 30d supply, fill #0

## 2022-04-17 ENCOUNTER — Other Ambulatory Visit (HOSPITAL_COMMUNITY): Payer: Self-pay

## 2022-04-20 ENCOUNTER — Encounter (HOSPITAL_BASED_OUTPATIENT_CLINIC_OR_DEPARTMENT_OTHER): Payer: Medicaid Other | Admitting: General Surgery

## 2022-04-23 ENCOUNTER — Other Ambulatory Visit (HOSPITAL_COMMUNITY): Payer: Self-pay

## 2022-04-27 ENCOUNTER — Inpatient Hospital Stay: Payer: Medicaid Other

## 2022-04-27 ENCOUNTER — Inpatient Hospital Stay (HOSPITAL_BASED_OUTPATIENT_CLINIC_OR_DEPARTMENT_OTHER): Payer: Medicaid Other | Admitting: Hematology and Oncology

## 2022-04-27 ENCOUNTER — Encounter (HOSPITAL_BASED_OUTPATIENT_CLINIC_OR_DEPARTMENT_OTHER): Payer: Medicaid Other | Admitting: General Surgery

## 2022-04-27 ENCOUNTER — Other Ambulatory Visit: Payer: Self-pay

## 2022-04-27 ENCOUNTER — Other Ambulatory Visit (HOSPITAL_COMMUNITY): Payer: Self-pay

## 2022-04-27 VITALS — BP 153/89 | HR 66 | Temp 97.7°F | Resp 19 | Wt 117.7 lb

## 2022-04-27 DIAGNOSIS — Z7982 Long term (current) use of aspirin: Secondary | ICD-10-CM | POA: Diagnosis not present

## 2022-04-27 DIAGNOSIS — Z794 Long term (current) use of insulin: Secondary | ICD-10-CM | POA: Diagnosis not present

## 2022-04-27 DIAGNOSIS — T451X5A Adverse effect of antineoplastic and immunosuppressive drugs, initial encounter: Secondary | ICD-10-CM | POA: Diagnosis not present

## 2022-04-27 DIAGNOSIS — D6481 Anemia due to antineoplastic chemotherapy: Secondary | ICD-10-CM | POA: Insufficient documentation

## 2022-04-27 DIAGNOSIS — E11621 Type 2 diabetes mellitus with foot ulcer: Secondary | ICD-10-CM | POA: Diagnosis not present

## 2022-04-27 DIAGNOSIS — C250 Malignant neoplasm of head of pancreas: Secondary | ICD-10-CM | POA: Insufficient documentation

## 2022-04-27 DIAGNOSIS — Z79899 Other long term (current) drug therapy: Secondary | ICD-10-CM | POA: Diagnosis not present

## 2022-04-27 DIAGNOSIS — E1165 Type 2 diabetes mellitus with hyperglycemia: Secondary | ICD-10-CM | POA: Diagnosis not present

## 2022-04-27 DIAGNOSIS — F17210 Nicotine dependence, cigarettes, uncomplicated: Secondary | ICD-10-CM | POA: Insufficient documentation

## 2022-04-27 DIAGNOSIS — D6959 Other secondary thrombocytopenia: Secondary | ICD-10-CM | POA: Insufficient documentation

## 2022-04-27 DIAGNOSIS — R1110 Vomiting, unspecified: Secondary | ICD-10-CM | POA: Diagnosis not present

## 2022-04-27 DIAGNOSIS — Z95828 Presence of other vascular implants and grafts: Secondary | ICD-10-CM

## 2022-04-27 DIAGNOSIS — K8689 Other specified diseases of pancreas: Secondary | ICD-10-CM | POA: Diagnosis not present

## 2022-04-27 DIAGNOSIS — Z923 Personal history of irradiation: Secondary | ICD-10-CM | POA: Insufficient documentation

## 2022-04-27 LAB — CBC WITH DIFFERENTIAL (CANCER CENTER ONLY)
Abs Immature Granulocytes: 0.02 10*3/uL (ref 0.00–0.07)
Basophils Absolute: 0 10*3/uL (ref 0.0–0.1)
Basophils Relative: 1 %
Eosinophils Absolute: 0.1 10*3/uL (ref 0.0–0.5)
Eosinophils Relative: 1 %
HCT: 31.1 % — ABNORMAL LOW (ref 39.0–52.0)
Hemoglobin: 11.3 g/dL — ABNORMAL LOW (ref 13.0–17.0)
Immature Granulocytes: 1 %
Lymphocytes Relative: 10 %
Lymphs Abs: 0.4 10*3/uL — ABNORMAL LOW (ref 0.7–4.0)
MCH: 34.3 pg — ABNORMAL HIGH (ref 26.0–34.0)
MCHC: 36.3 g/dL — ABNORMAL HIGH (ref 30.0–36.0)
MCV: 94.5 fL (ref 80.0–100.0)
Monocytes Absolute: 0.6 10*3/uL (ref 0.1–1.0)
Monocytes Relative: 16 %
Neutro Abs: 2.9 10*3/uL (ref 1.7–7.7)
Neutrophils Relative %: 71 %
Platelet Count: 122 10*3/uL — ABNORMAL LOW (ref 150–400)
RBC: 3.29 MIL/uL — ABNORMAL LOW (ref 4.22–5.81)
RDW: 20.4 % — ABNORMAL HIGH (ref 11.5–15.5)
WBC Count: 4 10*3/uL (ref 4.0–10.5)
nRBC: 0 % (ref 0.0–0.2)

## 2022-04-27 LAB — CMP (CANCER CENTER ONLY)
ALT: 18 U/L (ref 0–44)
AST: 46 U/L — ABNORMAL HIGH (ref 15–41)
Albumin: 2.9 g/dL — ABNORMAL LOW (ref 3.5–5.0)
Alkaline Phosphatase: 240 U/L — ABNORMAL HIGH (ref 38–126)
Anion gap: 5 (ref 5–15)
BUN: 8 mg/dL (ref 6–20)
CO2: 27 mmol/L (ref 22–32)
Calcium: 8.1 mg/dL — ABNORMAL LOW (ref 8.9–10.3)
Chloride: 99 mmol/L (ref 98–111)
Creatinine: 0.39 mg/dL — ABNORMAL LOW (ref 0.61–1.24)
GFR, Estimated: >60 mL/min (ref >60)
Glucose, Bld: 247 mg/dL — ABNORMAL HIGH (ref 70–99)
Potassium: 4 mmol/L (ref 3.5–5.1)
Sodium: 131 mmol/L — ABNORMAL LOW (ref 135–145)
Total Bilirubin: 0.5 mg/dL (ref 0.3–1.2)
Total Protein: 6.5 g/dL (ref 6.5–8.1)

## 2022-04-27 MED ORDER — SODIUM CHLORIDE 0.9% FLUSH
10.0000 mL | Freq: Once | INTRAVENOUS | Status: AC
  Administered 2022-04-27: 10 mL

## 2022-04-27 MED ORDER — HEPARIN SOD (PORK) LOCK FLUSH 100 UNIT/ML IV SOLN
500.0000 [IU] | Freq: Once | INTRAVENOUS | Status: AC
  Administered 2022-04-27: 500 [IU]

## 2022-04-27 NOTE — Progress Notes (Unsigned)
[place] [phone]   [phone]  PROGRESS NOTE  Patient Care Team: [name], NP as PCP - General (Nurse Practitioner) Werner Lean, MD as PCP - Cardiology (Cardiology) Armbruster, Carlota Raspberry, MD as Consulting Physician (Gastroenterology) Orson Slick, MD as Consulting Physician (Oncology) Royston Bake, RN as Registered Nurse  Hematological/Oncological History # Adenocarcinoma of the Pancreas, Stage IIB 02/26/2021: patient seen by GI for jaundice and pancreatic mass causing CBD obstruction. CT abdomen showed pancreatic head mass (4.4 x 3.7 cm) and enlarged portocaval lymph nodes. 02/27/2021: ERCP with placement of a nonmetallic biliary stent. FNA of pancreatic head showed malignant cells consistent with pancreatic adenocarcinoma. 03/05/2021: CT Chest showed no evidence of metastatic disease in the chest. 03/12/2021: establish care with Dr. Lorenso Courier 04/10/2021: Cycle 1 Day 1 of FOLFIRINOX 04/24/2021: Cycle 2 Day 1 of FOLFIRINOX 05/22/2021: Cycle 3 Day 1 of FOLFIRINOX. Delayed x 2 weeks due to hospitalization for poor po intake and dehydration due to gastroparesis. 06/05/2021: Cycle 4 Day 1 of FOLFIRINOX 06/19/2021: Cycle 5 Day 1 of FOLFIRINOX 07/02/2021: Cycle 6 Day 1 of FOLFIRINOX 07/16/2021: Cycle 7 Day 1 of FOLFIRINOX  07/30/2021: Cycle 8 Day 1 of FOLFIRINOX  08/13/2021: Cycle 9 Day 1 of FOLFIRINOX  08/27/2021:  Cycle 10 Day 1 of FOLFIRINOX  09/10/2021: Cycle 11 Day 1 of FOLFIRINOX  09/24/2021: Cycle 12 Day 1 of FOLFIRINOX  10/28/2021: Underwent open whipple with portal vein reconstruction utilizing a R deep FV graft at [facility]. Positive margins noted.  02/18/2022: Start of Chemoradiation with Xeloda 850 mg/m2 on radiation days (5 days per week)  04/09/2022: final day of radiation therapy.   Interval History:  [name] [name] 61 y.o. male returns for a follow up visit for pancreatic cancer. He was last seen on 04/07/2022. In the interim  since his last visit he has completed chemoradiation.   On exam today [name] [name] is unaccompanied.  He reports that he is currently planning on quitting smoking on September 1.  He has a Chantix and patches all lined up.  He reports that he has a CT scan upcoming at [facility] on 05/15/2022.  He has is not eating particular well and continues to lose weight.  He is down to 170 pounds 121 pounds.  He notes he just simply does not have a desire to eat.  His appetite is quite poor.  He notes his energy is "so-so".  He notes that he continues to work with wound care and is foot wound is currently under good control.  He is also continuing to manage his blood sugars.  He has had no falls in the interim since her last visit.  He notes that he is in need of a refill of his oxycodone at this time.  He has been taking his Creon and his bowel movements have been "good".  He has been eating out a lot and doing his best to work out.  He otherwise denies any fevers, chills, sweats, constipation, or redness of his hands or palms.  A full 10 point ROS is listed below.  MEDICAL HISTORY:  Past Medical History:  Diagnosis Date   Barrett's esophagus dx 2016   Bronchitis    Chronic hepatitis C without hepatic coma ([place]) 10/31/2014   Depression    Diabetes ([place])    ED (erectile dysfunction)    GERD (gastroesophageal reflux disease)    Hepatitis C    Hiatal hernia 10/2014   3cm   Neuropathy  pancreatic ca 12/2020   S/P transmetatarsal amputation of foot, left ([place]) 11/28/2020    SURGICAL HISTORY: Past Surgical History:  Procedure Laterality Date   AMPUTATION Left 11/15/2020   Procedure: LEFT TRANSMETATARSALS AMPUTATION;  Surgeon: Newt Minion, MD;  Location: [facility];  Service: Orthopedics;  Laterality: Left;   APPENDECTOMY     BILIARY STENT PLACEMENT N/A 02/27/2021   Procedure: BILIARY STENT PLACEMENT;  Surgeon: Milus Banister, MD;  Location: [facility];  Service: Endoscopy;  Laterality: N/A;   BILIARY STENT  PLACEMENT N/A 03/27/2021   Procedure: BILIARY STENT PLACEMENT;  Surgeon: Jackquline Denmark, MD;  Location: [facility];  Service: Endoscopy;  Laterality: N/A;   BIOPSY  03/27/2021   Procedure: BIOPSY;  Surgeon: Jackquline Denmark, MD;  [facility];  Service: Endoscopy;;   COLONOSCOPY N/A 02/16/2014   Procedure: COLONOSCOPY;  Surgeon: Gatha Mayer, MD;  [facility];  Service: Endoscopy;  Laterality: N/A;   COLONOSCOPY     ENDOSCOPIC RETROGRADE CHOLANGIOPANCREATOGRAPHY (ERCP) WITH PROPOFOL N/A 02/27/2021   Procedure: ENDOSCOPIC RETROGRADE CHOLANGIOPANCREATOGRAPHY (ERCP) WITH PROPOFOL;  Surgeon: Milus Banister, MD;  [facility];  Service: Endoscopy;  Laterality: N/A;   ERCP N/A 03/27/2021   Procedure: ENDOSCOPIC RETROGRADE CHOLANGIOPANCREATOGRAPHY (ERCP);  Surgeon: Jackquline Denmark, MD;  Location: [facility];  Service: Endoscopy;  Laterality: N/A;   ESOPHAGOGASTRODUODENOSCOPY (EGD) WITH PROPOFOL N/A 05/06/2021   Procedure: ESOPHAGOGASTRODUODENOSCOPY (EGD) WITH PROPOFOL;  Surgeon: Mauri Pole, MD;  [facility]: [facility];  Service: Endoscopy;  Laterality: N/A;   EUS N/A 02/27/2021   Procedure: UPPER ENDOSCOPIC ULTRASOUND (EUS) RADIAL;  Surgeon: Milus Banister, MD;  Location: [facility];  Service: Endoscopy;  Laterality: N/A;   FINE NEEDLE ASPIRATION N/A 02/27/2021   Procedure: FINE NEEDLE ASPIRATION (FNA) LINEAR;  Surgeon: Milus Banister, MD;  [facility];  Service: Endoscopy;  Laterality: N/A;   IR IMAGING GUIDED PORT INSERTION  03/21/2021   SPHINCTEROTOMY  02/27/2021   Procedure: SPHINCTEROTOMY;  Surgeon: Milus Banister, MD;  [facility];  Service: Endoscopy;;   STENT REMOVAL  03/27/2021   Procedure: STENT REMOVAL;  Surgeon: Jackquline Denmark, MD;  [facility];  Service: Endoscopy;;   TONSILLECTOMY      SOCIAL HISTORY: Social History   Socioeconomic History   Marital status: Single    Spouse name: Not on file   Number of  children: Not on file   Years of education: Not on file   Highest education level: Not on file  Occupational History   Not on file  Tobacco Use   Smoking status: Every Day    Packs/day: 0.50    Years: 35.00    Total pack years: 17.50    Types: Cigarettes   Smokeless tobacco: Never   Tobacco comments:    Currently being followed by the smoking cessation program   Vaping Use   Vaping Use: Never used  Substance and Sexual Activity   Alcohol use: Not Currently    Comment: previous   Drug use: No   Sexual activity: Not on file  Other Topics Concern   Not on file  Social History Narrative   Not on file   Social Determinants of Health   Financial Resource Strain: High Risk (03/13/2021)   Overall Financial Resource Strain (CARDIA)    Difficulty of Paying Living Expenses: Very hard  Food Insecurity: Food Insecurity Present (05/15/2021)   Hunger Vital Sign    Worried About Running Out of Food in the Last Year: Sometimes true  Ran Out of Food in the Last Year: Sometimes true  Transportation Needs: Unmet Transportation Needs (03/13/2021)   PRAPARE - Hydrologist (Medical): Yes    Lack of Transportation (Non-Medical): Yes  Physical Activity: Not on file  Stress: Not on file  Social Connections: Not on file  Intimate Partner Violence: Not At Risk (12/02/2021)   Humiliation, Afraid, Rape, and Kick questionnaire    Fear of Current or Ex-Partner: No    Emotionally Abused: No    Physically Abused: No    Sexually Abused: No    FAMILY HISTORY: Family History  Problem Relation Age of Onset   Breast cancer Mother    Stroke Father    Alcohol abuse Father    Heart disease Maternal Grandfather    Pancreatic cancer Paternal Grandmother    Diabetes Paternal Grandfather    Colon cancer Neg Hx    Stomach cancer Neg Hx    Rectal cancer Neg Hx    Esophageal cancer Neg Hx     ALLERGIES:  is allergic to bee venom and lactose intolerance (gi).  MEDICATIONS:   Current Outpatient Medications  Medication Sig Dispense Refill   Accu-Chek Softclix Lancets lancets Check blood glucose level by fingerstick 3-4 times per day. 100 each 3   acetaminophen (TYLENOL) 500 MG tablet Take 1 tablet (500 mg total) by mouth every 6 (six) hours as needed for moderate pain. 60 tablet 3   albuterol (VENTOLIN HFA) 108 (90 Base) MCG/ACT inhaler Inhale 2 puffs into the lungs every 6 hours as needed for wheezing or shortness of breath. 18 g 0   aspirin EC 81 MG tablet Take 1 tablet by mouth daily. 100 tablet 0   atorvastatin (LIPITOR) 20 MG tablet Take 1 tablet by mouth daily. 90 tablet 0   Continuous Blood Gluc Sensor (FREESTYLE LIBRE 2 SENSOR) MISC Use 1 sensor every 2 weeks as advised 6 each 3   diphenoxylate-atropine (LOMOTIL) 2.5-0.025 MG tablet Take 1 tablet by mouth 4 times daily as needed for diarrhea or loose stools. 60 tablet 1   DULoxetine (CYMBALTA) 60 MG capsule Take 1 capsule (60 mg total) by mouth daily. 90 capsule 1   EPINEPHrine 0.3 mg/0.3 mL IJ SOAJ injection Inject 0.3 mg into the muscle once as needed for anaphylaxis.     folic acid (FOLVITE) 1 MG tablet Take 1 tablet (1 mg total) by mouth in the morning.     glucose blood (ACCU-CHEK GUIDE) test strip Check blood glucose level by fingerstick 3 - 4 times per day. 100 each 12   insulin degludec (TRESIBA FLEXTOUCH) 100 UNIT/ML FlexTouch Pen Inject 24 Units into the skin daily. 9 mL 3   Insulin Lispro-aabc (LYUMJEV KWIKPEN) 100 UNIT/ML KwikPen Inject 5-8 Units into the skin 3 (three) times daily before meals. 15 mL 3   Insulin Pen Needle (TRUEPLUS PEN NEEDLES) 32G X 4 MM MISC Use as instructed. Inject into the skin twice daily 200 each 6   lidocaine-prilocaine (EMLA) cream Apply topically as directed when needed. 30 g 0   lipase/protease/amylase (CREON) 36000 UNITS CPEP capsule Take 1 capsule (36,000 Units total) by mouth 3 (three) times daily with meals. May take 1 additional capsule with snacks 270 capsule 3    mirtazapine (REMERON) 30 MG tablet Take 1 tablet (30 mg total) by mouth at bedtime. 90 tablet 0   Multiple Vitamins-Minerals (THEREMS-M) TABS Take 1 tablet by mouth in the morning. 90 tablet 3   mupirocin ointment (BACTROBAN)  2 % Apply to wound surface with each dressing change. 22 g 0   nicotine (NICODERM CQ - DOSED IN MG/24 HOURS) 14 mg/24hr patch Apply one patch daily x 12 weeks. 84 patch 1   nicotine (NICODERM CQ - DOSED IN MG/24 HOURS) 21 mg/24hr patch Apply one patch daily for 12 weeks. 84 patch 1   ondansetron (ZOFRAN) 8 MG tablet Take 1 tablet (8 mg total) by mouth 2 (two) times daily as needed. Start on day 3 after chemotherapy. 30 tablet 1   oxyCODONE ER (XTAMPZA ER) 9 MG C12A Take 1 capsule by mouth every 12 hours. 60 capsule 0   pantoprazole (PROTONIX) 40 MG tablet Take 1 tablet by mouth daily. 90 tablet 0   polyethylene glycol powder (GLYCOLAX/MIRALAX) 17 GM/SCOOP powder Take 17 g by mouth at bedtime. 238 g 0   prochlorperazine (COMPAZINE) 10 MG tablet Take 1 tablet (10 mg total) by mouth every 6 (six) hours as needed for nausea or vomiting. 30 tablet 1   varenicline (APO-VARENICLINE) 1 MG tablet Take 1 tablet by mouth once daily in the morning with food 84 tablet 1   zolpidem (AMBIEN) 10 MG tablet Take 1 tablet (10 mg total) by mouth at bedtime as needed for sleep. 30 tablet 1   oxyCODONE (OXY IR/ROXICODONE) 5 MG immediate release tablet Take 1 to 2 tablets by mouth every 6 hours as needed for severe pain. 120 tablet 0   No current facility-administered medications for this visit.    REVIEW OF SYSTEMS:   Constitutional: ( - ) fevers, ( - )  chills , ( - ) night sweats Eyes: ( - ) blurriness of vision, ( - ) double vision, ( - ) watery eyes Ears, nose, mouth, throat, and face: ( - ) mucositis, ( - ) sore throat Respiratory: ( - ) cough, ( - ) dyspnea, ( - ) wheezes Cardiovascular: ( - ) palpitation, ( - ) chest discomfort, ( - ) lower extremity swelling Gastrointestinal:  ( +)  nausea, ( - ) heartburn, ( - ) change in bowel habits Skin: ( - ) abnormal skin rashes Lymphatics: ( - ) new lymphadenopathy, ( - ) easy bruising Neurological: ( - ) numbness, ( - ) tingling, ( - ) new weaknesses Behavioral/Psych: ( - ) mood change, ( - ) new changes  All other systems were reviewed with the patient and are negative.  PHYSICAL EXAMINATION: ECOG PERFORMANCE STATUS: 1 - Symptomatic but completely ambulatory  Vitals:   04/27/22 0943  BP: (!) 153/89  Pulse: 66  Resp: 19  Temp: 97.7 F (36.5 C)  SpO2: 100%   Filed Weights   04/27/22 0943  Weight: 117 lb 11.2 oz (53.4 kg)    GENERAL: Well-appearing middle-age Caucasian male, alert, no distress and comfortable HEAD: Anterior laceration of the scalp, well healed  SKIN: skin color, texture, turgor are normal, no rashes or significant lesions.  EYES: conjunctiva are pink and non-injected, sclera clear LUNGS: clear to auscultation and percussion with normal breathing effort HEART: regular rate & rhythm and no murmurs and no lower extremity edema ABDOMEN: Midline incision healing. Well healed lesion on right abdomen.  EXTREMITIES: wound currently dressed.  PSYCH: alert & oriented x 3, fluent speech NEURO: no focal motor/sensory deficits  LABORATORY DATA:  I have reviewed the data as listed    Latest Ref Rng & Units 04/27/2022    9:27 AM 04/07/2022   10:10 AM 03/18/2022   10:28 AM  CBC  WBC 4.0 -  10.5 K/uL 4.0  6.9  9.1   Hemoglobin 13.0 - 17.0 g/dL 11.3  12.5  11.1   Hematocrit 39.0 - 52.0 % 31.1  35.6  31.9   Platelets 150 - 400 K/uL 122  144  164        Latest Ref Rng & Units 04/27/2022    9:27 AM 04/07/2022   10:10 AM 03/18/2022   10:28 AM  CMP  Glucose 70 - 99 mg/dL 247  378  325   BUN 6 - 20 mg/dL '8  8  8   '$ Creatinine 0.61 - 1.24 mg/dL 0.39  0.54  0.47   Sodium 135 - 145 mmol/L 131  134  129   Potassium 3.5 - 5.1 mmol/L 4.0  4.1  4.5   Chloride 98 - 111 mmol/L 99  100  96   CO2 22 - 32 mmol/L '27  29  29    '$ Calcium 8.9 - 10.3 mg/dL 8.1  8.3  8.6   Total Protein 6.5 - 8.1 g/dL 6.5  7.4  6.9   Total Bilirubin 0.3 - 1.2 mg/dL 0.5  0.5  0.7   Alkaline Phos 38 - 126 U/L 240  162  145   AST 15 - 41 U/L 46  24  29   ALT 0 - 44 U/L '18  12  12    '$ PATHOLOGY:  Procedure:    Pancreaticoduodenectomy (Whipple resection), partial pancreatectomy   TUMOR     Tumor Site:    Pancreatic head     Histologic Type:    Ductal adenocarcinoma (NOS)     Histologic Grade:    G2, moderately differentiated     Tumor Size:    Greatest Dimension (Centimeters): 3.7 cm       Additional Dimension (Centimeters):    2.6 cm       Additional Dimension (Centimeters):    2.1 cm     Site(s) Involved by Direct Tumor Extension:    Ampulla of Vater or sphincter of Oddi     Site(s) Involved by Direct Tumor Extension:    Duodenal wall     Site(s) Involved by Direct Tumor Extension:    Superior mesenteric vein     Treatment Effect:    Absent, with extensive residual cancer and no evident tumor regression (poor or no response, score 3)     Lymphovascular Invasion:    Present     Perineural Invasion:    Present   MARGINS     Margin Status for Invasive Carcinoma:    Invasive carcinoma present at margin       Margin(s) Involved by Invasive Carcinoma:    Pancreatic neck / parenchymal       Margin(s) Involved by Invasive Carcinoma:    Uncinate (retroperitoneal / superior mesenteric artery)     Margin Status for Dysplasia and Intraepithelial Neoplasia:    All margins negative for dysplasia and intraepithelial neoplasia   REGIONAL LYMPH NODES     Regional Lymph Node Status:           :    All regional lymph nodes negative for tumor       Number of Lymph Nodes Examined:    36   PATHOLOGIC STAGE CLASSIFICATION  (pTNM, AJCC 8th Edition)     Reporting of pT, pN, and (when applicable) pM categories is based on information available to the pathologist at the time the report is issued. As per the AJCC (Chapter 1, 8th Ed.) it is the  managing  physician's responsibility to establish the final pathologic stage based upon all pertinent information, including but potentially not limited to this pathology report.     TNM Descriptors:    y (post-treatment)     pT Category:    pT2     pN Category:    pN0   RADIOGRAPHIC STUDIES: I have personally reviewed the radiological images as listed and agreed with the findings in the report: Borderline resectable pancreatic mass with no evidence of metastatic disease. No results found.  ASSESSMENT & PLAN [name] [name] 61 y.o. male with medical history significant for borderline resectable pancreatic cancer presents for a follow up visit.   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  The patient does still have an elevation in bilirubin which likely represents a decline from the time of the latest stent placement.  He was evaluated by surgery who determined this is a borderline resectable tumor.  We will plan to start the patient on neoadjuvant chemotherapy.  The 2 options would be Gem/Abraxane or FOLFIRINOX.  Given his excellent functional status I do believe he would be a good candidate for FOLFIRINOX.  He completed 12 cycles of neoadjuvant FOLFIRINOX.  He underwent open whipple with portal vein reconstruction utilizing a R deep FV graft on 10/28/2021 at [facility]  Due to positive margins we will plan to treat with chemoradiation moving forward. The treatment of choice will be Xeloda 850 mg/m2 on radiation days (5 days per week) over the course of radiation.    # Adenocarcinoma of the Pancreas, Stage IIB -- At this time disease appears borderline resectable.  We will proceed with neoadjuvant chemotherapy.  At this time would prefer a regimen of FOLFIRINOX given his good baseline health. -- case reviewed by surgery, who agrees his disease is borderline resectable.  --  baseline elevations in both CA 19-9 and CEA. Continue to monitor during treatment.  -- Cycle 1 Day 1 of FOLFIRINOX started 04/10/2021. Cycle 12 Day 1 on 09/24/2021, completed neoadjuvant chemotherapy. --Underwent open whipple with portal vein reconstruction utilizing a R deep FV graft on 10/28/2021 at [facility]. Noted to have positive margins, adjuvant chemoradiation was recommended. Plan: --completed chemoradiation with Xeloda on 04/09/2022 --repeat CT scan planned for 05/15/2022 at [place] --RTC in 4 weeks time to reassess   #Forehead laceration: --well healing, markedly improved.   #Post Surgical Infections-improving --WBC 4.0, Hgb 11.3 --No evidence of infection at this time.  #Gastroparesis #Nausea/Vomiting/Poor PO Intake #Diarrhea --patient advanced diet to solid foods.  --nausea meds/antidiarrheal as below. --continue to work on glycemic control --continue to monitor.   #Chemotherapy induced Anemia and Thrombocytopenia --Hgb is stable at 11.3, stable. continue to monitor --Plt stable at 122 K --continue to monitor   # Pain Control -- Patient currently taking ibuprofen for his abdominal pain --continue oxycodone 10 mg every 6 hours as needed. He has consistently been taking '40mg'$  PO daily total. --continue Xtampza 9 mg q 12H   #Hyperglycemia: --today's glucose level is 247 --insulin-requiring type 2 DM --under the care of endocrinology  #Left foot wound: -- He has established care with wound care center -- Foot wound was observed today and does not appear to be actively infected.  No erythema or tenderness.  No purulent discharge.  #Supportive Care -- chemotherapy education complete -- port placed -- zofran '8mg'$  q8H PRN and compazine '10mg'$  PO q6H  for nausea --Creon to help with loose stools. --lomotil q 6 H PRN for diarrhea.  -- EMLA cream for port  No orders of the defined types were placed in this encounter.  All questions were answered. The patient knows to  call the clinic with any problems, questions or concerns.  I have spent a total of 30 minutes minutes of face-to-face and non-face-to-face time, preparing to see the patient,  performing a medically appropriate examination, counseling and educating the patient, ordering tests/procedures, documenting clinical information in the electronic health record and care coordination.   Ledell Peoples, MD Department of Hematology/Oncology [place] [facility] Phone: [phone] Pager: [phone] Email: [email or web address]
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
## 2022-04-28 ENCOUNTER — Other Ambulatory Visit (HOSPITAL_COMMUNITY): Payer: Self-pay

## 2022-04-28 ENCOUNTER — Encounter: Payer: Self-pay | Admitting: Hematology and Oncology

## 2022-04-28 ENCOUNTER — Telehealth: Payer: Self-pay | Admitting: Hematology and Oncology

## 2022-04-28 MED ORDER — OXYCODONE HCL 5 MG PO TABS
5.0000 mg | ORAL_TABLET | Freq: Four times a day (QID) | ORAL | 0 refills | Status: DC | PRN
  Filled 2022-04-28: qty 120, 15d supply, fill #0

## 2022-04-28 NOTE — Telephone Encounter (Signed)
Per 8/28 los called and spoke to pt about appointment

## 2022-04-29 ENCOUNTER — Other Ambulatory Visit: Payer: Self-pay | Admitting: Family Medicine

## 2022-04-29 ENCOUNTER — Other Ambulatory Visit (HOSPITAL_COMMUNITY): Payer: Self-pay

## 2022-04-29 DIAGNOSIS — F10982 Alcohol use, unspecified with alcohol-induced sleep disorder: Secondary | ICD-10-CM

## 2022-04-29 MED ORDER — MIRTAZAPINE 30 MG PO TABS
30.0000 mg | ORAL_TABLET | Freq: Every day | ORAL | 0 refills | Status: DC
  Filled 2022-04-29: qty 90, 90d supply, fill #0

## 2022-04-30 ENCOUNTER — Other Ambulatory Visit (HOSPITAL_COMMUNITY): Payer: Self-pay

## 2022-04-30 ENCOUNTER — Encounter: Payer: Self-pay | Admitting: Hematology and Oncology

## 2022-05-05 ENCOUNTER — Encounter (HOSPITAL_BASED_OUTPATIENT_CLINIC_OR_DEPARTMENT_OTHER): Payer: Medicaid Other | Attending: General Surgery | Admitting: General Surgery

## 2022-05-05 DIAGNOSIS — B189 Chronic viral hepatitis, unspecified: Secondary | ICD-10-CM | POA: Insufficient documentation

## 2022-05-05 DIAGNOSIS — Z8507 Personal history of malignant neoplasm of pancreas: Secondary | ICD-10-CM | POA: Insufficient documentation

## 2022-05-05 DIAGNOSIS — Z7984 Long term (current) use of oral hypoglycemic drugs: Secondary | ICD-10-CM | POA: Diagnosis not present

## 2022-05-05 DIAGNOSIS — L97528 Non-pressure chronic ulcer of other part of left foot with other specified severity: Secondary | ICD-10-CM | POA: Insufficient documentation

## 2022-05-05 DIAGNOSIS — K219 Gastro-esophageal reflux disease without esophagitis: Secondary | ICD-10-CM | POA: Insufficient documentation

## 2022-05-05 DIAGNOSIS — K2270 Barrett's esophagus without dysplasia: Secondary | ICD-10-CM | POA: Insufficient documentation

## 2022-05-05 DIAGNOSIS — Z9221 Personal history of antineoplastic chemotherapy: Secondary | ICD-10-CM | POA: Diagnosis not present

## 2022-05-05 DIAGNOSIS — E11621 Type 2 diabetes mellitus with foot ulcer: Secondary | ICD-10-CM | POA: Insufficient documentation

## 2022-05-05 DIAGNOSIS — Z8619 Personal history of other infectious and parasitic diseases: Secondary | ICD-10-CM | POA: Insufficient documentation

## 2022-05-05 DIAGNOSIS — E1142 Type 2 diabetes mellitus with diabetic polyneuropathy: Secondary | ICD-10-CM | POA: Insufficient documentation

## 2022-05-05 DIAGNOSIS — E1165 Type 2 diabetes mellitus with hyperglycemia: Secondary | ICD-10-CM | POA: Insufficient documentation

## 2022-05-05 DIAGNOSIS — F32A Depression, unspecified: Secondary | ICD-10-CM | POA: Diagnosis not present

## 2022-05-05 DIAGNOSIS — L89152 Pressure ulcer of sacral region, stage 2: Secondary | ICD-10-CM | POA: Insufficient documentation
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
## 2022-05-06 ENCOUNTER — Other Ambulatory Visit (HOSPITAL_COMMUNITY): Payer: Self-pay

## 2022-05-06 ENCOUNTER — Ambulatory Visit: Payer: Medicaid Other | Attending: Nurse Practitioner | Admitting: Nurse Practitioner

## 2022-05-06 ENCOUNTER — Encounter: Payer: Self-pay | Admitting: Nurse Practitioner

## 2022-05-06 VITALS — BP 133/82 | HR 93 | Temp 98.1°F | Ht 70.0 in | Wt 119.8 lb

## 2022-05-06 DIAGNOSIS — R638 Other symptoms and signs concerning food and fluid intake: Secondary | ICD-10-CM | POA: Diagnosis not present

## 2022-05-06 DIAGNOSIS — E1165 Type 2 diabetes mellitus with hyperglycemia: Secondary | ICD-10-CM | POA: Diagnosis not present

## 2022-05-06 DIAGNOSIS — R636 Underweight: Secondary | ICD-10-CM | POA: Diagnosis not present

## 2022-05-06 DIAGNOSIS — Z0001 Encounter for general adult medical examination with abnormal findings: Secondary | ICD-10-CM

## 2022-05-06 DIAGNOSIS — Z23 Encounter for immunization: Secondary | ICD-10-CM | POA: Diagnosis not present

## 2022-05-06 DIAGNOSIS — Z8719 Personal history of other diseases of the digestive system: Secondary | ICD-10-CM

## 2022-05-06 DIAGNOSIS — F32A Depression, unspecified: Secondary | ICD-10-CM

## 2022-05-06 DIAGNOSIS — F419 Anxiety disorder, unspecified: Secondary | ICD-10-CM

## 2022-05-06 DIAGNOSIS — Z0000 Encounter for general adult medical examination without abnormal findings: Secondary | ICD-10-CM

## 2022-05-06 MED ORDER — ESCITALOPRAM OXALATE 10 MG PO TABS
10.0000 mg | ORAL_TABLET | Freq: Every day | ORAL | 1 refills | Status: AC
  Filled 2022-05-06: qty 90, 90d supply, fill #0
  Filled 2022-05-25 – 2022-07-20 (×4): qty 90, 90d supply, fill #1

## 2022-05-06 NOTE — Progress Notes (Signed)
Assessment & Plan:  [name] [name] was seen today for annual exam.  Diagnoses and all orders for this visit:  Encounter for annual physical exam  Anxiety and depression -     Ambulatory referral to [place] -     escitalopram (LEXAPRO) 10 MG tablet; Take 1 tablet (10 mg total) by mouth daily.  Hyperglycemia due to diabetes mellitus ([name] [name]) -     Ambulatory referral to Ophthalmology -     Referral to Nutrition and Diabetes Services -     Microalbumin / creatinine urine ratio  History of dental problems -     Ambulatory referral to Dentistry  Low weight -     Referral to Nutrition and Diabetes Services  Dehydration symptoms -     Urinalysis, Complete  Need for immunization against influenza -     Flu Vaccine QUAD 37moIM (Fluarix, Fluzone & Alfiuria Quad PF)    Patient has been counseled on age-appropriate routine health concerns for screening and prevention. These are reviewed and up-to-date. Referrals have been placed accordingly. Immunizations are up-to-date or declined.    Subjective:   Chief Complaint  Patient presents with   Annual Exam   HPI [name] MONTELLANO89y.o. male presents to office today for annual physical.  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   Notes worsening depression and unintentional weight loss. Having a difficult time gaining weight and knowing what foods to eat to help reduce muscle wasting. He has a history of pancreatic cancer and currently receiving chemo treatments which may likely be contributing to his weight loss.   Dental Problem Needs to see a dentist for dental care including extractions and denture replacement.   Review of Systems  Constitutional:  Positive for  malaise/fatigue and weight loss. Negative for fever.  HENT: Negative.  Negative for nosebleeds.   Eyes: Negative.  Negative for blurred vision, double vision and photophobia.  Respiratory: Negative.  Negative for cough and shortness of breath.   Cardiovascular: Negative.  Negative for chest pain, palpitations and leg swelling.  Gastrointestinal: Negative.  Negative for heartburn, nausea and vomiting.  Genitourinary: Negative.   Musculoskeletal: Negative.  Negative for myalgias.  Skin: Negative.   Neurological: Negative.  Negative for dizziness, focal weakness, seizures and headaches.  Endo/Heme/Allergies: Negative.   Psychiatric/Behavioral:  Positive for depression. Negative for suicidal ideas. The patient is nervous/anxious.     Past Medical History:  Diagnosis Date   Barrett's esophagus dx 2016   Bronchitis    Chronic hepatitis C without hepatic coma ([name] [name] 10/31/2014   Depression    Diabetes ([facility]    ED (erectile dysfunction)    GERD (gastroesophageal reflux disease)    Hepatitis C    Hiatal hernia 10/2014   3cm   Neuropathy    pancreatic ca 12/2020   S/P transmetatarsal amputation of foot, left ([name] [name] 11/28/2020    Past Surgical History:  Procedure Laterality Date   AMPUTATION Left 11/15/2020   Procedure: LEFT TRANSMETATARSALS AMPUTATION;  Surgeon: [name] Minion MD;  [place] [facility]  Service: Orthopedics;  Laterality: Left;   APPENDECTOMY     BILIARY STENT PLACEMENT N/A 02/27/2021   Procedure: BILIARY STENT PLACEMENT;  Surgeon: [name] Banister MD;  [facility]: [facility];  Service: Endoscopy;  Laterality: N/A;   BILIARY  STENT PLACEMENT N/A 03/27/2021   Procedure: BILIARY STENT PLACEMENT;  Surgeon: [name] Denmark, MD;  Location: [facility];  Service: Endoscopy;  Laterality: N/A;   BIOPSY  03/27/2021   Procedure: BIOPSY;  Surgeon: [name] Denmark, MD;  [facility];  Service: Endoscopy;;   COLONOSCOPY N/A 02/16/2014   Procedure: COLONOSCOPY;  Surgeon: [name] Mayer, MD;  [facility];  Service: Endoscopy;  Laterality: N/A;   COLONOSCOPY     ENDOSCOPIC RETROGRADE CHOLANGIOPANCREATOGRAPHY (ERCP) WITH PROPOFOL N/A 02/27/2021   Procedure: ENDOSCOPIC RETROGRADE CHOLANGIOPANCREATOGRAPHY (ERCP) WITH PROPOFOL;  Surgeon: Milus Banister, MD;  [facility];  Service: Endoscopy;  Laterality: N/A;   ERCP N/A 03/27/2021   Procedure: ENDOSCOPIC RETROGRADE CHOLANGIOPANCREATOGRAPHY (ERCP);  Surgeon: [name] Denmark, MD;  Location: [facility];  Service: Endoscopy;  Laterality: N/A;   ESOPHAGOGASTRODUODENOSCOPY (EGD) WITH PROPOFOL N/A 05/06/2021   Procedure: ESOPHAGOGASTRODUODENOSCOPY (EGD) WITH PROPOFOL;  Surgeon: [name] Pole, MD;  [facility];  Service: Endoscopy;  Laterality: N/A;   EUS N/A 02/27/2021   Procedure: UPPER ENDOSCOPIC ULTRASOUND (EUS) RADIAL;  Surgeon: Milus Banister, MD;  [facility];  Service: Endoscopy;  Laterality: N/A;   FINE NEEDLE ASPIRATION N/A 02/27/2021   Procedure: FINE NEEDLE ASPIRATION (FNA) LINEAR;  Surgeon: Milus Banister, MD;  [facility];  Service: Endoscopy;  Laterality: N/A;   IR IMAGING GUIDED PORT INSERTION  03/21/2021   SPHINCTEROTOMY  02/27/2021   Procedure: SPHINCTEROTOMY;  Surgeon: Milus Banister, MD;  [facility];  Service: Endoscopy;;   STENT REMOVAL  03/27/2021   Procedure: STENT REMOVAL;  Surgeon: [name] Denmark, MD;  [facility];  Service: Endoscopy;;   TONSILLECTOMY      Family History  Problem Relation Age of Onset   Breast cancer Mother    Stroke Father    Alcohol abuse Father    Heart disease Maternal Grandfather    Pancreatic cancer Paternal Grandmother    Diabetes Paternal Grandfather    Colon cancer Neg Hx    Stomach cancer Neg Hx    Rectal cancer Neg Hx    Esophageal cancer Neg Hx     Social History Reviewed with no changes to be made today.   Outpatient Medications Prior to Visit  Medication Sig Dispense Refill    Accu-Chek Softclix Lancets lancets Check blood glucose level by fingerstick 3-4 times per day. 100 each 3   acetaminophen (TYLENOL) 500 MG tablet Take 1 tablet by mouth every 6 hours as needed for moderate pain. 60 tablet 3   albuterol (VENTOLIN HFA) 108 (90 Base) MCG/ACT inhaler Inhale 2 puffs into the lungs every 6 hours as needed for wheezing or shortness of breath. 18 g 0   aspirin EC 81 MG tablet Take 1 tablet by mouth daily. 100 tablet 0   atorvastatin (LIPITOR) 20 MG tablet Take 1 tablet by mouth daily. 90 tablet 0   Continuous Blood Gluc Sensor (FREESTYLE LIBRE 2 SENSOR) MISC Use 1 sensor every 2 weeks as advised 6 each 3   diphenoxylate-atropine (LOMOTIL) 2.5-0.025 MG tablet Take 1 tablet by mouth 4 times daily as needed for diarrhea or loose stools. 60 tablet 1   DULoxetine (CYMBALTA) 60 MG capsule Take 1 capsule (60 mg total) by mouth daily. 90 capsule 1   EPINEPHrine 0.3 mg/0.3 mL IJ SOAJ injection Inject 0.3 mg into the muscle once as needed for anaphylaxis.     folic acid (FOLVITE) 1 MG tablet Take 1 tablet (1 mg total) by mouth in  the morning.     glucose blood (ACCU-CHEK GUIDE) test strip Check blood glucose level by fingerstick 3 - 4 times per day. 100 each 12   insulin degludec (TRESIBA FLEXTOUCH) 100 UNIT/ML FlexTouch Pen Inject 24 Units into the skin daily. 9 mL 3   Insulin Lispro-aabc (LYUMJEV KWIKPEN) 100 UNIT/ML KwikPen Inject 5-8 Units into the skin 3 (three) times daily before meals. 15 mL 3   Insulin Pen Needle (TRUEPLUS PEN NEEDLES) 32G X 4 MM MISC Use as instructed. Inject into the skin twice daily 200 each 6   lidocaine-prilocaine (EMLA) cream Apply topically as directed when needed. 30 g 0   lipase/protease/amylase (CREON) 36000 UNITS CPEP capsule Take 1 capsule (36,000 Units total) by mouth 3 (three) times daily with meals. May take 1 additional capsule with snacks 270 capsule 3   mirtazapine (REMERON) 30 MG tablet Take 1 tablet (30 mg total) by mouth at bedtime. 90  tablet 0   Multiple Vitamins-Minerals (THEREMS-M) TABS Take 1 tablet by mouth in the morning. 90 tablet 3   mupirocin ointment (BACTROBAN) 2 % Apply to wound surface with each dressing change. 22 g 0   nicotine (NICODERM CQ - DOSED IN MG/24 HOURS) 21 mg/24hr patch Apply one patch daily for 12 weeks. 84 patch 1   ondansetron (ZOFRAN) 8 MG tablet Take 1 tablet (8 mg total) by mouth 2 (two) times daily as needed. Start on day 3 after chemotherapy. 30 tablet 1   oxyCODONE (OXY IR/ROXICODONE) 5 MG immediate release tablet Take 1 to 2 tablets by mouth every 6 hours as needed for severe pain. 120 tablet 0   oxyCODONE ER (XTAMPZA ER) 9 MG C12A Take 1 capsule by mouth every 12 hours. 60 capsule 0   pantoprazole (PROTONIX) 40 MG tablet Take 1 tablet by mouth daily. 90 tablet 0   prochlorperazine (COMPAZINE) 10 MG tablet Take 1 tablet (10 mg total) by mouth every 6 (six) hours as needed for nausea or vomiting. 30 tablet 1   varenicline (APO-VARENICLINE) 1 MG tablet Take 1 tablet by mouth once daily in the morning with food 84 tablet 1   zolpidem (AMBIEN) 10 MG tablet Take 1 tablet (10 mg total) by mouth at bedtime as needed for sleep. 30 tablet 1   nicotine (NICODERM CQ - DOSED IN MG/24 HOURS) 14 mg/24hr patch Apply one patch daily x 12 weeks. (Patient not taking: Reported on 05/06/2022) 84 patch 1   polyethylene glycol powder (GLYCOLAX/MIRALAX) 17 GM/SCOOP powder Take 17 g by mouth at bedtime. (Patient not taking: Reported on 05/06/2022) 238 g 0   No facility-administered medications prior to visit.    Allergies  Allergen Reactions   Bee Venom Anaphylaxis   Lactose Intolerance (Gi) Other (See Comments)    GI upset       Objective:    BP 133/82   Pulse 93   Temp 98.1 F (36.7 C) (Oral)   Ht '5\' 10"'$  (1.778 m)   Wt 119 lb 12.8 oz (54.3 kg)   SpO2 100%   BMI 17.19 kg/m  Wt Readings from Last 3 Encounters:  05/06/22 119 lb 12.8 oz (54.3 kg)  04/27/22 117 lb 11.2 oz (53.4 kg)  04/07/22 119 lb (54  kg)    Physical Exam Constitutional:      Appearance: He is underweight.  HENT:     Head: Normocephalic and atraumatic.     Right Ear: Hearing, tympanic membrane, ear canal and external ear normal.     Left  Ear: Hearing, tympanic membrane, ear canal and external ear normal.     Nose: Nose normal. No mucosal edema or rhinorrhea.     Right Turbinates: Not enlarged.     Left Turbinates: Not enlarged.     Mouth/Throat:     Lips: Pink.     Mouth: Mucous membranes are moist.     Dentition: Abnormal dentition. Has dentures (partial). Dental caries present. No gingival swelling, dental abscesses or gum lesions.     Pharynx: Uvula midline.     Tonsils: No tonsillar exudate. 1+ on the right. 1+ on the left.  Eyes:     General: Lids are normal. No scleral icterus.    Extraocular Movements: Extraocular movements intact.     Conjunctiva/sclera: Conjunctivae normal.     Pupils: Pupils are equal, round, and reactive to light.  Neck:     Thyroid: No thyromegaly.     Trachea: No tracheal deviation.  Cardiovascular:     Rate and Rhythm: Normal rate and regular rhythm.     Heart sounds: Normal heart sounds. No murmur heard.    No friction rub. No gallop.  Pulmonary:     Effort: Pulmonary effort is normal. No respiratory distress.     Breath sounds: Normal breath sounds. No wheezing or rales.  Chest:     Chest wall: No mass or tenderness.  Breasts:    Right: No inverted nipple, mass, nipple discharge, skin change or tenderness.     Left: No inverted nipple, mass, nipple discharge, skin change or tenderness.  Abdominal:     General: Bowel sounds are normal. There is no distension.     Palpations: Abdomen is soft. There is no mass.     Tenderness: There is no abdominal tenderness. There is no guarding or rebound.  Musculoskeletal:        General: No tenderness or deformity. Normal range of motion.     Cervical back: Normal range of motion and neck supple.  Lymphadenopathy:     Cervical: No  cervical adenopathy.  Skin:    General: Skin is warm and dry.     Capillary Refill: Capillary refill takes less than 2 seconds.     Findings: No erythema.  Neurological:     Mental Status: He is alert and oriented to person, place, and time.     Cranial Nerves: No cranial nerve deficit.     Sensory: Sensation is intact.     Motor: No abnormal muscle tone.     Coordination: Coordination is intact. Coordination normal.     Gait: Gait is intact.     Deep Tendon Reflexes: Reflexes normal.     Reflex Scores:      Patellar reflexes are 1+ on the right side and 1+ on the left side. Psychiatric:        Attention and Perception: Attention normal.        Mood and Affect: Mood normal.        Speech: Speech normal.        Behavior: Behavior normal.        Thought Content: Thought content normal.        Cognition and Memory: Cognition and memory normal.        Judgment: Judgment normal.          Patient has been counseled extensively about nutrition and exercise as well as the importance of adherence with medications and regular follow-up. The patient was given clear instructions to go to ER or return  to medical center if symptoms don't improve, worsen or new problems develop. The patient verbalized understanding.   Follow-up: Return in about 6 months (around 11/04/2022).   [name], FNP-BC [facility] and [facility], [place]   05/06/2022, 9:19 PM

## 2022-05-07 ENCOUNTER — Other Ambulatory Visit: Payer: Self-pay | Admitting: Nurse Practitioner

## 2022-05-07 ENCOUNTER — Other Ambulatory Visit: Payer: Self-pay | Admitting: Physician Assistant

## 2022-05-07 ENCOUNTER — Other Ambulatory Visit (HOSPITAL_COMMUNITY): Payer: Self-pay

## 2022-05-07 ENCOUNTER — Other Ambulatory Visit: Payer: Self-pay | Admitting: Family Medicine

## 2022-05-07 DIAGNOSIS — K219 Gastro-esophageal reflux disease without esophagitis: Secondary | ICD-10-CM

## 2022-05-07 DIAGNOSIS — C250 Malignant neoplasm of head of pancreas: Secondary | ICD-10-CM

## 2022-05-07 DIAGNOSIS — K2210 Ulcer of esophagus without bleeding: Secondary | ICD-10-CM

## 2022-05-07 DIAGNOSIS — F17200 Nicotine dependence, unspecified, uncomplicated: Secondary | ICD-10-CM

## 2022-05-07 DIAGNOSIS — K3184 Gastroparesis: Secondary | ICD-10-CM

## 2022-05-07 LAB — MICROSCOPIC EXAMINATION
Bacteria, UA: NONE SEEN
Casts: NONE SEEN /lpf
Epithelial Cells (non renal): NONE SEEN /hpf (ref 0–10)
RBC, Urine: NONE SEEN /hpf (ref 0–2)
WBC, UA: NONE SEEN /hpf (ref 0–5)

## 2022-05-07 LAB — URINALYSIS, COMPLETE
Bilirubin, UA: NEGATIVE
Glucose, UA: NEGATIVE
Ketones, UA: NEGATIVE
Leukocytes,UA: NEGATIVE
Nitrite, UA: NEGATIVE
Protein,UA: NEGATIVE
RBC, UA: NEGATIVE
Specific Gravity, UA: 1.022 (ref 1.005–1.030)
Urobilinogen, Ur: 0.2 mg/dL (ref 0.2–1.0)
pH, UA: 6 (ref 5.0–7.5)

## 2022-05-07 LAB — MICROALBUMIN / CREATININE URINE RATIO
Creatinine, Urine: 77.8 mg/dL
Microalb/Creat Ratio: 10 mg/g creat (ref 0–29)
Microalbumin, Urine: 8 ug/mL

## 2022-05-07 MED ORDER — PANTOPRAZOLE SODIUM 40 MG PO TBEC
40.0000 mg | DELAYED_RELEASE_TABLET | Freq: Every day | ORAL | 1 refills | Status: AC
  Filled 2022-05-07 – 2022-07-20 (×4): qty 90, 90d supply, fill #0

## 2022-05-07 MED ORDER — ATORVASTATIN CALCIUM 20 MG PO TABS
20.0000 mg | ORAL_TABLET | Freq: Every day | ORAL | 1 refills | Status: DC
  Filled 2022-05-07 – 2022-06-29 (×3): qty 90, 90d supply, fill #0

## 2022-05-07 MED ORDER — EPINEPHRINE 0.3 MG/0.3ML IJ SOAJ
0.3000 mg | Freq: Once | INTRAMUSCULAR | 1 refills | Status: AC | PRN
  Filled 2022-05-07: qty 2, 30d supply, fill #0

## 2022-05-07 NOTE — Progress Notes (Signed)
  Radiation Oncology         ([phone]) [phone] ________________________________  Name: [name] [name] MRN: [id number]  Date of Service: 05/18/2022  DOB: 1960-10-10  Post Treatment Telephone Note  Diagnosis:   Stage IIB, ypT2N0 adenocarcinoma of the pancreatic head following neoadjuvant chemotherapy.  Intent: Curative  Radiation Treatment Dates: 02/18/2022 through 04/09/2022 Site Technique Total Dose (Gy) Dose per Fx (Gy) Completed Fx Beam Energies  Pancreas: Pancreas IMRT 45/45 1.8 25/25 6X  Pancreas: Pancreas_Bst IMRT 5.4/5.4 1.8 3/3 6X   Narrative: The patient tolerated radiation therapy relatively well. He developed fatigue and  nausea during therapy. He's currently hospitalized at [facility] with an infected lower extremity ulcer. He's declined amputation and is on IV antibiotics. Unfortunately a scan while he was hospitalized shows progressive disease from his pancreas cancer and that it is now in his liver.  Impression/Plan: 1. Stage IIB, ypT2N0 adenocarcinoma of the pancreatic head following neoadjuvant chemotherapy.. The patient has been doing well since completion of radiotherapy but having other issues related to his left foot and new disease in his liver/ We discussed that we would be happy to continue to follow him as needed, but he will also continue to follow up with Dr. Lorenso Courier in medical oncology.      Carola Rhine, PAC

## 2022-05-08 ENCOUNTER — Other Ambulatory Visit (HOSPITAL_COMMUNITY): Payer: Self-pay

## 2022-05-08 MED ORDER — MUPIROCIN 2 % EX OINT
TOPICAL_OINTMENT | CUTANEOUS | 0 refills | Status: DC
  Filled 2022-05-08: qty 22, 7d supply, fill #0

## 2022-05-10 ENCOUNTER — Encounter: Payer: Self-pay | Admitting: Hematology and Oncology

## 2022-05-10 MED ORDER — ALBUTEROL SULFATE HFA 108 (90 BASE) MCG/ACT IN AERS
INHALATION_SPRAY | RESPIRATORY_TRACT | 0 refills | Status: DC
  Filled 2022-05-10: qty 18, 25d supply, fill #0

## 2022-05-10 MED ORDER — ONDANSETRON HCL 8 MG PO TABS
8.0000 mg | ORAL_TABLET | Freq: Two times a day (BID) | ORAL | 1 refills | Status: DC | PRN
  Filled 2022-05-10: qty 30, 15d supply, fill #0
  Filled 2022-06-29: qty 30, 15d supply, fill #1

## 2022-05-10 MED ORDER — LIDOCAINE-PRILOCAINE 2.5-2.5 % EX CREA
1.0000 | TOPICAL_CREAM | CUTANEOUS | 0 refills | Status: DC | PRN
  Filled 2022-05-10: qty 30, 30d supply, fill #0

## 2022-05-10 MED ORDER — PROCHLORPERAZINE MALEATE 10 MG PO TABS
10.0000 mg | ORAL_TABLET | Freq: Four times a day (QID) | ORAL | 1 refills | Status: DC | PRN
  Filled 2022-05-10: qty 30, 8d supply, fill #0

## 2022-05-11 ENCOUNTER — Other Ambulatory Visit: Payer: Self-pay | Admitting: Hematology and Oncology

## 2022-05-11 ENCOUNTER — Other Ambulatory Visit (HOSPITAL_COMMUNITY): Payer: Self-pay

## 2022-05-11 ENCOUNTER — Encounter: Payer: Medicaid Other | Attending: Nurse Practitioner | Admitting: Nutrition

## 2022-05-11 DIAGNOSIS — E1169 Type 2 diabetes mellitus with other specified complication: Secondary | ICD-10-CM | POA: Diagnosis present

## 2022-05-12 ENCOUNTER — Encounter (HOSPITAL_BASED_OUTPATIENT_CLINIC_OR_DEPARTMENT_OTHER): Payer: Medicaid Other | Admitting: General Surgery

## 2022-05-12 ENCOUNTER — Encounter: Payer: Self-pay | Admitting: Hematology and Oncology

## 2022-05-12 ENCOUNTER — Other Ambulatory Visit (HOSPITAL_COMMUNITY): Payer: Self-pay

## 2022-05-12 DIAGNOSIS — E11621 Type 2 diabetes mellitus with foot ulcer: Secondary | ICD-10-CM | POA: Diagnosis not present

## 2022-05-12 MED ORDER — MUPIROCIN 2 % EX OINT
TOPICAL_OINTMENT | CUTANEOUS | 0 refills | Status: DC
  Filled 2022-05-12: qty 22, fill #0
  Filled 2022-05-31: qty 22, 15d supply, fill #0

## 2022-05-12 MED ORDER — SULFAMETHOXAZOLE-TRIMETHOPRIM 800-160 MG PO TABS
1.0000 | ORAL_TABLET | Freq: Two times a day (BID) | ORAL | 0 refills | Status: DC
  Filled 2022-05-12: qty 20, 10d supply, fill #0

## 2022-05-12 MED ORDER — OXYCODONE HCL 5 MG PO TABS
5.0000 mg | ORAL_TABLET | Freq: Four times a day (QID) | ORAL | 0 refills | Status: DC | PRN
  Filled 2022-05-12: qty 120, 15d supply, fill #0

## 2022-05-12 NOTE — Progress Notes (Signed)
Patient is here today because he is wanting to gain weight.  Says weight has gone down due to Chemotherapy from CA, and no desire to eat due to "no taste buds" Discussed idea that he can not gain weight until blood sugars get under better control.  High blood sugars mean calories lost out the urine.  He reported good understanding of this.   Insulin dose:  Tresiba 8u QAM-  says takes this QAM                       Lyumjev:  does not take this.  Says will drop low.   SBGM:  Libre 2  CGM download shows readings doing very high pcB, and smaller rise: 190-200 pcS. Diet:  typical day: 6AM: up tests blood sugars-usually 100-130.  Takes Tyler Aas 7AM: Raisin Bran cereal and 2% lactaid milk, or Ensure 5-7PM: has started cooking again, and had roast with potatoes and carrots.  Usually "anything that I get get quickly vague on supper history 8-11: snack --vague on what he is eating  Discussion:  Need for fast-acting insulin to prevent blood sugars from going high after meals, and able to adjust dose based on amount of carbs eaten for meal. Small meals: 3u, medium meals: 4u and larger meals: 5u plus correction dose. Need for 3 balanced meals of protein- to rebuild muscle, fat to slow down blood sugar rise and needed calories,and to feel full, and carbs for needed energy and weight gain.  Discussion of what foods are in each group and how much he should be eating-1800-2000 calories/day Review for quantities needed at each meal, and examples of breakfast, lunch and supper with HS snack suggestions.  Meal plan of 2- carb choices at breakfast, and 3 acL and supper,and 1 at HS.  Protein: 2 ounces ac B, 3 acL and S, and 1 HS.  Fats: 1 ac meals and HS.   Meal plan given with suggestions for protein, fat and carb choices.  Yellow meal plan card given Need to test blood sugars 2hr. Pc and reading should be under 180 for calorie retention.  If higher, will need to increase insulin dose by 1-2 units ac meals. Will  accommodate cereal for now, due to patient's desire to eat this but suggested high fat protein with this meal, like cheese or 2T peanut butter.  Pt. Willing to do this.

## 2022-05-12 NOTE — Progress Notes (Signed)
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 05/12/2022 08:35:53
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
## 2022-05-12 NOTE — Patient Instructions (Signed)
Make sure all meals have 2 servings of carbohydrate, 2-3 ounces of protein and at least one serving of fat Take Lyumjev insulin before meals-adjusting dose, based on 2 hour after meal blood sugar readings-(if blood sugar 2hr. After meals is over 180, will increase Humalog dose by 1u)

## 2022-05-12 NOTE — Progress Notes (Signed)
FABIO, WAH ([id number]) Visit Report for 05/12/2022 Fall Risk Assessment Details Patient Name: Date of Service: [facility], [name] [facility] H. 05/12/2022 7:30 A M Medical Record Number: [id number] Patient Account Number: [id number] Date of Birth/Sex: Treating RN: 20-Jan-1961 (61 y.o. [name] Primary Care Ragen Laver: [name] Other Clinician: Referring Jesslyn Viglione: Treating Payal Stanforth/Extender: [name] in Treatment: 34 Fall Risk Assessment Items Have you had 2 or more falls in the last 12 monthso 0 Yes Have you had any fall that resulted in injury in the last 12 monthso 0 No FALLS RISK SCREEN History of falling - immediate or within 3 months 25 Yes Secondary diagnosis (Do you have 2 or more medical diagnoseso) 0 No Ambulatory aid None/bed rest/wheelchair/nurse 0 No Crutches/cane/walker 0 No Furniture 0 No Intravenous therapy Access/Saline/Heparin Lock 0 No Gait/Transferring Normal/ bed rest/ wheelchair 0 Yes Weak (short steps with or without shuffle, stooped but able to lift head while walking, may seek 0 No support from furniture) Impaired (short steps with shuffle, may have difficulty arising from chair, head down, impaired 0 No balance) Mental Status Oriented to own ability 0 Yes Electronic Signature(s) Signed: 05/12/2022 4:55:43 PM By: [name] RN, BSN Entered By: [name] on 05/12/2022 07:57:51

## 2022-05-13 ENCOUNTER — Other Ambulatory Visit (HOSPITAL_COMMUNITY): Payer: Self-pay

## 2022-05-18 ENCOUNTER — Ambulatory Visit
Admission: RE | Admit: 2022-05-18 | Discharge: 2022-05-18 | Disposition: A | Discharge status: Home / Self Care | Payer: Medicaid Other | Source: Ambulatory Visit | Attending: Hematology and Oncology | Admitting: Hematology and Oncology

## 2022-05-18 DIAGNOSIS — C250 Malignant neoplasm of head of pancreas: Secondary | ICD-10-CM | POA: Insufficient documentation

## 2022-05-19 ENCOUNTER — Encounter (HOSPITAL_BASED_OUTPATIENT_CLINIC_OR_DEPARTMENT_OTHER): Payer: Medicaid Other | Admitting: General Surgery

## 2022-05-19 ENCOUNTER — Ambulatory Visit (HOSPITAL_BASED_OUTPATIENT_CLINIC_OR_DEPARTMENT_OTHER): Payer: Medicaid Other | Admitting: General Surgery

## 2022-05-21 ENCOUNTER — Encounter: Payer: Self-pay | Admitting: Nurse Practitioner

## 2022-05-21 ENCOUNTER — Telehealth: Payer: Self-pay

## 2022-05-21 ENCOUNTER — Encounter: Payer: Self-pay | Admitting: Hematology and Oncology

## 2022-05-21 NOTE — Telephone Encounter (Signed)
Received call from Pricilla Holm, [place] with [facility] requesting [facility] appointment with [facility]. He is scheduled to be discharged from [facility] tomorrow 9/21.   Scheduled appointment with Dr. West Bali for 10/2 for left foot diabetic wound. Notes from [place] are available in [place].   Beryle Flock, RN

## 2022-05-25 ENCOUNTER — Other Ambulatory Visit (HOSPITAL_COMMUNITY): Payer: Self-pay

## 2022-05-25 ENCOUNTER — Other Ambulatory Visit: Payer: Self-pay | Admitting: Physician Assistant

## 2022-05-25 ENCOUNTER — Other Ambulatory Visit: Payer: Self-pay | Admitting: Nurse Practitioner

## 2022-05-25 DIAGNOSIS — F5101 Primary insomnia: Secondary | ICD-10-CM

## 2022-05-26 ENCOUNTER — Other Ambulatory Visit (HOSPITAL_COMMUNITY): Payer: Self-pay

## 2022-05-26 ENCOUNTER — Ambulatory Visit: Payer: Medicaid Other | Admitting: Hematology and Oncology

## 2022-05-26 ENCOUNTER — Other Ambulatory Visit: Payer: Medicaid Other

## 2022-05-26 NOTE — Telephone Encounter (Signed)
Requested medication (s) are due for refill today: yes  Requested medication (s) are on the active medication list: yes    Last refill: 02/27/22  #30  1 refill  Future visit scheduled No  Notes to clinic:Not delegated, please review.  Requested Prescriptions  Pending Prescriptions Disp Refills   zolpidem (AMBIEN) 10 MG tablet 30 tablet 1    Sig: Take 1 tablet (10 mg total) by mouth at bedtime as needed for sleep.     Not Delegated - Psychiatry:  Anxiolytics/Hypnotics Failed - 05/25/2022  4:50 PM      Failed - This refill cannot be delegated      Failed - Urine Drug Screen completed in last 360 days      Passed - Valid encounter within last 6 months    Recent Outpatient Visits           2 weeks ago Encounter for annual physical exam   [place], Vernia Buff, NP   3 months ago Hospital discharge follow-up   [place], Zelda W, NP   5 months ago Essential hypertension   [place], Vernia Buff, NP   7 months ago Loose stools   [place], Vernia Buff, NP   10 months ago GERD without esophagitis   [place], Vernia Buff, NP       Future Appointments             In 2 months Gasper Sells, Terisa Starr, MD [place]. [facility]   In 5 months [name], NP [place]

## 2022-05-27 ENCOUNTER — Other Ambulatory Visit (HOSPITAL_COMMUNITY): Payer: Self-pay

## 2022-05-27 ENCOUNTER — Encounter: Payer: Self-pay | Admitting: Hematology and Oncology

## 2022-05-27 MED ORDER — ZOLPIDEM TARTRATE 10 MG PO TABS
10.0000 mg | ORAL_TABLET | Freq: Every evening | ORAL | 1 refills | Status: DC | PRN
  Filled 2022-05-27: qty 30, 30d supply, fill #0
  Filled 2022-05-31 – 2022-06-15 (×3): qty 30, 30d supply, fill #1

## 2022-05-28 ENCOUNTER — Encounter: Payer: Self-pay | Admitting: Hematology and Oncology

## 2022-05-28 ENCOUNTER — Other Ambulatory Visit (HOSPITAL_COMMUNITY): Payer: Self-pay

## 2022-05-28 ENCOUNTER — Other Ambulatory Visit: Payer: Self-pay | Admitting: Physician Assistant

## 2022-05-28 MED ORDER — XTAMPZA ER 9 MG PO C12A
9.0000 mg | EXTENDED_RELEASE_CAPSULE | Freq: Two times a day (BID) | ORAL | 0 refills | Status: DC
  Filled 2022-05-28: qty 60, 30d supply, fill #0

## 2022-05-29 ENCOUNTER — Other Ambulatory Visit: Payer: Self-pay | Admitting: Hematology and Oncology

## 2022-05-29 ENCOUNTER — Other Ambulatory Visit (HOSPITAL_COMMUNITY): Payer: Self-pay

## 2022-05-29 MED ORDER — METHADONE HCL 5 MG PO TABS
ORAL_TABLET | ORAL | 0 refills | Status: DC
  Filled 2022-05-29: qty 60, 30d supply, fill #0

## 2022-05-31 ENCOUNTER — Encounter: Payer: Self-pay | Admitting: Hematology and Oncology

## 2022-05-31 MED ORDER — OXYCODONE HCL 5 MG PO TABS
5.0000 mg | ORAL_TABLET | Freq: Four times a day (QID) | ORAL | 0 refills | Status: DC | PRN
  Filled 2022-05-31: qty 120, 15d supply, fill #0

## 2022-06-01 ENCOUNTER — Encounter: Payer: Self-pay | Admitting: Hematology and Oncology

## 2022-06-01 ENCOUNTER — Encounter: Payer: Self-pay | Admitting: Infectious Diseases

## 2022-06-01 ENCOUNTER — Other Ambulatory Visit: Payer: Self-pay

## 2022-06-01 ENCOUNTER — Other Ambulatory Visit (HOSPITAL_COMMUNITY): Payer: Self-pay

## 2022-06-01 ENCOUNTER — Ambulatory Visit (INDEPENDENT_AMBULATORY_CARE_PROVIDER_SITE_OTHER): Payer: Medicaid Other | Admitting: Infectious Diseases

## 2022-06-01 VITALS — BP 153/89 | HR 85 | Temp 98.0°F | Ht 71.0 in | Wt 119.0 lb

## 2022-06-01 DIAGNOSIS — Z79899 Other long term (current) drug therapy: Secondary | ICD-10-CM | POA: Diagnosis not present

## 2022-06-01 DIAGNOSIS — M86272 Subacute osteomyelitis, left ankle and foot: Secondary | ICD-10-CM

## 2022-06-01 DIAGNOSIS — Z95828 Presence of other vascular implants and grafts: Secondary | ICD-10-CM

## 2022-06-01 DIAGNOSIS — F17200 Nicotine dependence, unspecified, uncomplicated: Secondary | ICD-10-CM | POA: Diagnosis not present

## 2022-06-01 NOTE — Progress Notes (Addendum)
Patient Active Problem List   Diagnosis Date Noted   Protein-calorie malnutrition, severe 01/07/2022   Acute respiratory failure with hypoxia (HCC) 01/05/2022   Type 2 diabetes mellitus with ketosis ([place]) 01/05/2022   Pericardial effusion 01/05/2022   Pressure injury of skin 01/05/2022   Abdominal fluid collection 12/19/2021   Encounter for dental examination 09/12/2021   Teeth missing 09/12/2021   [place] 09/12/2021   Defective dental restoration 09/12/2021   Retained tooth root 09/12/2021   Chronic periodontitis 09/12/2021   Accretions on teeth 09/12/2021   Oral mucositis 09/12/2021   Ill-fitting dentures 09/12/2021   Wound of foot 09/10/2021   Closed displaced fracture of proximal phalanx of finger of left hand 06/12/2021   AKI (acute kidney injury) ([place]) 05/12/2021   Abdominal distension    Gastroparesis    Gastric outlet obstruction 05/05/2021   Abdominal pain 05/05/2021   Dehydration 05/05/2021   Nausea & vomiting 05/04/2021   Port-A-Cath in place 04/24/2021   Malnutrition of moderate degree 03/27/2021   Hyperglycemia due to diabetes mellitus ([place]) 03/26/2021   Pancreatic cancer ([place]) 03/12/2021   Malignant neoplasm of head of pancreas ([place]) 03/12/2021   Jaundice 02/26/2021   Loss of weight 02/26/2021   Hypertension associated with type 2 diabetes mellitus ([place]) 11/28/2020   Type 2 diabetes mellitus with diabetic neuropathy, unspecified ([place]) 11/28/2020   Hyperlipidemia associated with type 2 diabetes mellitus ([place]) 11/28/2020   GERD without esophagitis 11/28/2020   Major depression, recurrent, chronic ([place]) 11/28/2020   Aortic atherosclerosis ([place]) 11/28/2020   Diabetic neuropathy ([place]) 11/28/2020   Osteomyelitis ([place]) 11/14/2020   Sepsis ([place]) 10/28/2020   Community acquired pneumonia of right lower lobe of lung    Elevated LFTs    Hypophosphatemia    Acute metabolic encephalopathy    Acute maxillary sinusitis    Depression    Physical deconditioning     Pneumonia 05/14/2020   Hyponatremia 05/13/2020   Hypokalemia 05/13/2020   SIRS (systemic inflammatory response syndrome) ([place]) 05/13/2020   Hereditary hemochromatosis ([place]) 11/23/2017   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   Essential hypertension 04/02/2017   Hypogonadism male 08/13/2016   ETOH abuse 06/22/2016   Low serum testosterone level 01/08/2016   Type 2 diabetes mellitus with other specified complication ([place]) 02/28/1600   Erectile dysfunction 09/12/2015   Chronic hepatitis C without hepatic coma ([place]) 10/31/2014   Tobacco use disorder 08/03/2013   Diabetes type 2, controlled ([place]) 04/27/2013    Patient's Medications  New Prescriptions   No medications on file  Previous Medications   ACCU-CHEK SOFTCLIX LANCETS LANCETS    Check blood glucose level by fingerstick 3-4 times per day.   ACETAMINOPHEN (TYLENOL) 500 MG TABLET    Take 1 tablet by mouth every 6 hours as needed for moderate pain.   ALBUTEROL (VENTOLIN HFA) 108 (90 BASE) MCG/ACT INHALER    Inhale 2 puffs into the lungs every 6 hours as needed for wheezing or shortness of breath.   ASPIRIN EC 81 MG TABLET    Take 1 tablet by mouth daily.   ATORVASTATIN (LIPITOR) 20 MG TABLET    Take 1 tablet by mouth daily.   CONTINUOUS BLOOD GLUC SENSOR (FREESTYLE LIBRE 2 SENSOR) MISC    Use 1 sensor every 2 weeks as advised   DIPHENOXYLATE-ATROPINE (LOMOTIL) 2.5-0.025 MG TABLET    Take 1 tablet by mouth 4 times daily as needed for diarrhea or loose stools.   DULOXETINE (CYMBALTA) 60 MG CAPSULE    Take  1 capsule (60 mg total) by mouth daily.   EPINEPHRINE 0.3 MG/0.3 ML IJ SOAJ INJECTION    Inject 0.3 mg into the muscle once as needed for anaphylaxis.   ESCITALOPRAM (LEXAPRO) 10 MG TABLET    Take 1 tablet (10 mg total) by mouth daily.   FOLIC ACID (FOLVITE) 1 MG TABLET    Take 1 tablet (1 mg total) by mouth in the morning.   GLUCOSE BLOOD (ACCU-CHEK GUIDE) TEST STRIP    Check blood glucose level by fingerstick 3 - 4  times per day.   INSULIN DEGLUDEC (TRESIBA FLEXTOUCH) 100 UNIT/ML FLEXTOUCH PEN    Inject 24 Units into the skin daily.   INSULIN LISPRO-AABC (LYUMJEV KWIKPEN) 100 UNIT/ML KWIKPEN    Inject 5-8 Units into the skin 3 (three) times daily before meals.   INSULIN PEN NEEDLE (TRUEPLUS PEN NEEDLES) 32G X 4 MM MISC    Use as instructed. Inject into the skin twice daily   LIDOCAINE-PRILOCAINE (EMLA) CREAM    Apply topically as directed when needed.   LIPASE/PROTEASE/AMYLASE (CREON) 36000 UNITS CPEP CAPSULE    Take 1 capsule (36,000 Units total) by mouth 3 (three) times daily with meals. May take 1 additional capsule with snacks   METHADONE (DOLOPHINE) 5 MG TABLET    Take 1 tablet (5 mg total) by mouth every 12 (twelve) hours for 30 days   MIRTAZAPINE (REMERON) 30 MG TABLET    Take 1 tablet (30 mg total) by mouth at bedtime.   MULTIPLE VITAMINS-MINERALS (THEREMS-M) TABS    Take 1 tablet by mouth in the morning.   MUPIROCIN OINTMENT (BACTROBAN) 2 %    Apply to wound surface with each dressing change.   NICOTINE (NICODERM CQ - DOSED IN MG/24 HOURS) 21 MG/24HR PATCH    Apply one patch daily for 12 weeks.   ONDANSETRON (ZOFRAN) 8 MG TABLET    Take 1 tablet (8 mg total) by mouth 2 (two) times daily as needed. Start on day 3 after chemotherapy.   OXYCODONE (OXY IR/ROXICODONE) 5 MG IMMEDIATE RELEASE TABLET    Take 1 to 2 tablets by mouth every 6 hours as needed for severe pain.   OXYCODONE ER (XTAMPZA ER) 9 MG C12A    Take 1 capsule by mouth every 12 hours.   PANTOPRAZOLE (PROTONIX) 40 MG TABLET    Take 1 tablet by mouth daily.   PROCHLORPERAZINE (COMPAZINE) 10 MG TABLET    Take 1 tablet (10 mg total) by mouth every 6 (six) hours as needed for nausea or vomiting.   SULFAMETHOXAZOLE-TRIMETHOPRIM (BACTRIM DS) 800-160 MG TABLET    Take 1 tablet by mouth 2 (two) times daily for 10 days.   VARENICLINE (APO-VARENICLINE) 1 MG TABLET    Take 1 tablet by mouth once daily in the morning with food   ZOLPIDEM (AMBIEN) 10 MG  TABLET    Take 1 tablet (10 mg total) by mouth at bedtime as needed for sleep.  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 History mostly obtained from chart review, patient is a poor historian, appears sad with recent diagnosis of mets to the  liver. However, denies SI/HI.   Patient was admitted 9/15-9/22 for left TMA site osteomyelitis. [facility] Kitchen MRI done 9/16 showing OM involving the residual 1st and 2nd metatarsals along with sinus tract formation and dorsal periosteal abscess. Patient refused BKA for source control as he did not want to spend his limited life expectancy due to cancer in wheelchair and post op recovery.  S/p Surgical debridement L foot with Podiatry 9/20. OR cx with staph caprae ( CoN). Was initially on Vancomycin and zosyn which was later de-escalated to Vancomycin and levofloxacin under ID guidance with planned 6 weeks course. EOT 07/01/22.   Denies fevers and sweats but feels cold. He has a port in the rt chest - denies any pain/tenderness and swelling.  He had one episode of NBNB vomiting this morning and 2-3 episodes of loose stool a day but no abdominal cramps. Denies cough, chest pain and SOB. He is following wound care and does not have a podiatrist and would like to see one. He continues ot smoke 10 cigarettesa day and denies alcohol and IVDU. He says his pancreatic ca has metastasized to his liver and he was told he has approx a year left. Hence, he does not want to have an ampuptation done.   He also has some swelling in his rt foot with no swelling, warmth and redness. No calf tenderness   Continues to smoke, denies alcohol  and IVDU. He is willing to quit.   Review of Systems: all systems reviewed with pertinent positives and negatives as listed above.    Past Medical History:  Diagnosis Date   Barrett's esophagus dx 2016   Bronchitis    Chronic hepatitis C without hepatic coma ([place]) 10/31/2014   Depression    Diabetes [facility])    ED (erectile dysfunction)    GERD (gastroesophageal reflux disease)    Hepatitis C    Hiatal hernia 10/2014   3cm   Neuropathy    pancreatic ca 12/2020   S/P transmetatarsal amputation of foot, left ([place]) 11/28/2020   Past Surgical History:  Procedure Laterality Date   AMPUTATION Left 11/15/2020   Procedure: LEFT TRANSMETATARSALS AMPUTATION;  Surgeon: Newt Minion, MD;  Location: [place];  Service: Orthopedics;  Laterality: Left;   APPENDECTOMY     BILIARY STENT PLACEMENT N/A 02/27/2021   Procedure: BILIARY STENT PLACEMENT;  Surgeon: Milus Banister, MD;  [facility];  Service: Endoscopy;  Laterality: N/A;   BILIARY STENT PLACEMENT N/A 03/27/2021   Procedure: BILIARY STENT PLACEMENT;  Surgeon: Jackquline Denmark, MD;  [facility];  Service: Endoscopy;  Laterality: N/A;   BIOPSY  03/27/2021   Procedure: BIOPSY;  Surgeon: Jackquline Denmark, MD;  [facility];  Service: Endoscopy;;   COLONOSCOPY N/A 02/16/2014   Procedure: COLONOSCOPY;  Surgeon: Gatha Mayer, MD;  [facility];  Service: Endoscopy;  Laterality: N/A;   COLONOSCOPY     ENDOSCOPIC RETROGRADE CHOLANGIOPANCREATOGRAPHY (ERCP) WITH PROPOFOL N/A 02/27/2021   Procedure: ENDOSCOPIC RETROGRADE CHOLANGIOPANCREATOGRAPHY (ERCP) WITH PROPOFOL;  Surgeon: Milus Banister, MD;  [facility];  Service: Endoscopy;  Laterality: N/A;   ERCP N/A 03/27/2021   Procedure: ENDOSCOPIC RETROGRADE CHOLANGIOPANCREATOGRAPHY (ERCP);  Surgeon: Jackquline Denmark, MD;  [facility];  Service: Endoscopy;  Laterality: N/A;   ESOPHAGOGASTRODUODENOSCOPY (EGD) WITH PROPOFOL N/A 05/06/2021   Procedure:  ESOPHAGOGASTRODUODENOSCOPY (EGD) WITH PROPOFOL;  Surgeon: Mauri Pole, MD;  [facility];  Service: Endoscopy;  Laterality: N/A;   EUS N/A 02/27/2021   Procedure: UPPER ENDOSCOPIC  ULTRASOUND (EUS) RADIAL;  Surgeon: Milus Banister, MD;  [facility];  Service: Endoscopy;  Laterality: N/A;   FINE NEEDLE ASPIRATION N/A 02/27/2021   Procedure: FINE NEEDLE ASPIRATION (FNA) LINEAR;  Surgeon: Milus Banister, MD;  [facility];  Service: Endoscopy;  Laterality: N/A;   IR IMAGING GUIDED PORT INSERTION  03/21/2021   SPHINCTEROTOMY  02/27/2021   Procedure: SPHINCTEROTOMY;  Surgeon: Milus Banister, MD;  [facility];  Service: Endoscopy;;   STENT REMOVAL  03/27/2021   Procedure: Lavell Islam REMOVAL;  Surgeon: Jackquline Denmark, MD;  [facility];  Service: Endoscopy;;   TONSILLECTOMY       Social History   Tobacco Use   Smoking status: Every Day    Packs/day: 0.50    Years: 35.00    Total pack years: 17.50    Types: Cigarettes   Smokeless tobacco: Never   Tobacco comments:    Currently being followed by the smoking cessation program   Vaping Use   Vaping Use: Never used  Substance Use Topics   Alcohol use: Not Currently    Comment: previous   Drug use: No    Family History  Problem Relation Age of Onset   Breast cancer Mother    Stroke Father    Alcohol abuse Father    Heart disease Maternal Grandfather    Pancreatic cancer Paternal Grandmother    Diabetes Paternal Grandfather    Colon cancer Neg Hx    Stomach cancer Neg Hx    Rectal cancer Neg Hx    Esophageal cancer Neg Hx     Allergies  Allergen Reactions   Bee Venom Anaphylaxis   Lactose Intolerance (Gi) Other (See Comments)    GI upset    Health Maintenance  Topic Date Due   COVID-19 Vaccine (4 - Mixed Product risk series) 08/23/2020   OPHTHALMOLOGY EXAM  04/12/2021   Zoster Vaccines- Shingrix (1 of 2) 08/05/2022 (Originally 06/08/1980)   FOOT EXAM  06/05/2022    COLONOSCOPY (Pts 45-11yr Insurance coverage will need to be confirmed)  07/09/2022   HEMOGLOBIN A1C  10/04/2022   Diabetic kidney evaluation - GFR measurement  04/28/2023   Diabetic kidney evaluation - Urine ACR  05/07/2023   TETANUS/TDAP  03/09/2028   INFLUENZA VACCINE  Completed   Hepatitis C Screening  Completed   HIV Screening  Completed   HPV VACCINES  Aged Out    Objective: BP (!) 153/89 Comment: Provider notified  Pulse 85   Temp 98 F (36.7 C) (Oral)   Ht '5\' 11"'$  (1.803 m)   Wt 119 lb (54 kg)   SpO2 100%   BMI 16.60 kg/m    Physical Exam Constitutional:      Appearance: malnourished  HENT:     Head: Normocephalic and atraumatic.      Mouth: Mucous membranes are moist.  Eyes:    Conjunctiva/sclera: Conjunctivae normal.     Pupils:   Cardiovascular:     Rate and Rhythm: Normal rate and regular rhythm.     Heart sounds:   Pulmonary:     Effort: Pulmonary effort is normal.     Breath sounds: Normal breath sounds.   Abdominal:     General: Non distended     Palpations: soft. Midline surgical scar marked has healed   Musculoskeletal:        General: Normal range of motion.   Left foot TMA site has sutures in with no redness/warmth and fluctuance. 1+ DP in left  foot. ROM of left ankle intact RT foot with some swelling but no redness, warmth and tenderness or fluctuance . 2+ DP in rt foot        Skin:    General: Skin is warm and dry.     Comments:  Neurological:     General: grossly non focal     Mental Status: awake, alert and oriented to person, place, and time.   Psychiatric:        Mood and Affect: Mood normal.   Lab Results Lab Results  Component Value Date   WBC 4.0 04/27/2022   HGB 11.3 (L) 04/27/2022   HCT 31.1 (L) 04/27/2022   MCV 94.5 04/27/2022   PLT 122 (L) 04/27/2022    Lab Results  Component Value Date   CREATININE 0.39 (L) 04/27/2022   BUN 8 04/27/2022   NA 131 (L) 04/27/2022   K 4.0 04/27/2022   CL 99 04/27/2022   CO2  27 04/27/2022    Lab Results  Component Value Date   ALT 18 04/27/2022   AST 46 (H) 04/27/2022   ALKPHOS 240 (H) 04/27/2022   BILITOT 0.5 04/27/2022    Lab Results  Component Value Date   CHOL 174 04/28/2021   HDL 70 04/28/2021   LDLCALC 89 04/28/2021   TRIG 78 04/28/2021   CHOLHDL 2.5 04/28/2021   No results found for: "LABRPR", "RPRTITER" No results found for: "HIV1RNAQUANT", "HIV1RNAVL", "CD4TABS"   Microbiology Results for orders placed or performed in visit on 05/06/22  Microscopic Examination     Status: None   Collection Time: 05/06/22 10:23 AM  Result Value Ref Range Status   WBC, UA None seen 0 - 5 /hpf Final   RBC, Urine None seen 0 - 2 /hpf Final   Epithelial Cells (non renal) None seen 0 - 10 /hpf Final   Casts None seen None seen /lpf Final   Bacteria, UA None seen None seen/Few Final   *Note: Due to a large number of results and/or encounters for the requested time period, some results have not been displayed. A complete set of results can be found in Results Review.   Imaging No results found.  ABI Vascular 05/19/22 Impression:   Right ankle brachial index (ABI) of 1.33 is consistent with falsely  elevated due to vessel calcification arterial occlusive disease at rest.  Continuous wave Doppler waveforms at the ankle of the posterior tibial  artery were triphasic and triphasic at the dorsalis pedis artery. Digit  pressure is consistent with healing.  Elevated pressures due to medial wall calcifications cannot be excluded by  continuous wave Doppler   Left ankle brachial index (ABI) of 1.40 is consistent with falsely  elevated due to vessel calcification arterial occlusive disease at rest.  Continuous wave Doppler waveforms at the ankle of the posterior tibial  artery were biphasic and biphasic at the dorsalis pedis artery. Digit  pressure is consistent with unlikely healing.  Elevated pressures due to medial wall calcifications cannot be excluded by  continuous wave Doppler    MRI left foot 05/16/22 Impression:  Status post TMA with medial plantar ulcer and sinus tract extending to the  first metatarsal osseous surgical margin where there is dorsal periosteal  abscess/phlegmon and osteomyelitis involving the residual first and second  metatarsals.  Xray left foot 05/15/22 IMPRESSION:  Possible cortical irregularity involving the medial aspect of the residual  first metatarsal. This is indeterminate, although can be seen in setting of  osteomyelitis.  CT chest abdomen pelvis 05/15/22 Impression:  1. New hepatic hypodensities not seen on the 01/30/2022 CT are most  compatible with metastatic disease.  2. Stable postsurgical changes of Whipple procedure. No definite locally  recurrent disease in the surgical bed.   Assessment/Plan  # Diabetic Foot Ulcer # Left TMA site Osteomyelitis  Previously treated for Left foot osteomyelitis with TMA 11/15/20  ( OR cx MRSA and PsA) followed with prolonged IV abtx S/p Surgical debridement L foot with Podiatry 9/20. OR cx with CoN staph caprae On Vancomycin, dosing per [facility] and levofloxacin for 6 weeks. EOT 07/01/22. 9/21 425 Fu in 3-4 weeks  Amb referral to Podiatry Amb referral to Vascular surgery for concerns of PVD  # Medication Monitoring  - Labs from 9/28 reviewed from care everywhere. Vancomycin trough 11.1 - Vancomcycin dosing adjusted by Porfirio Oar D  # Pot-a-cath in place - no concerns   # Metastatic pancreatic ca including Liver  - Follows Dr Lorenso Courier   I have personally spent more than 70 minutes involved in face-to-face and non-face-to-face activities for this patient on the day of the visit. Professional time spent includes the following activities: Preparing to see the patient (review of tests), Obtaining and/or reviewing separately obtained history (admission/discharge record), Performing a medically appropriate examination and/or evaluation , Ordering  medications/tests/procedures, referring and communicating with other health care professionals, Documenting clinical information in the EMR, Independently interpreting results (not separately reported), Communicating results to the patient/family/caregiver, Counseling and educating the patient/family/caregiver and Care coordination (not separately reported).   Wilber Oliphant, [facility] 06/01/2022, 10:23 AM

## 2022-06-01 NOTE — Patient Instructions (Signed)
   Counseling Services: [facility] [employer] ([email or web address] [phone]

## 2022-06-03 ENCOUNTER — Inpatient Hospital Stay: Payer: Medicaid Other

## 2022-06-03 ENCOUNTER — Inpatient Hospital Stay: Payer: Medicaid Other | Attending: Hematology and Oncology | Admitting: Hematology and Oncology

## 2022-06-03 ENCOUNTER — Encounter (HOSPITAL_BASED_OUTPATIENT_CLINIC_OR_DEPARTMENT_OTHER): Payer: Medicaid Other | Attending: General Surgery | Admitting: General Surgery

## 2022-06-03 VITALS — BP 125/82 | HR 89 | Temp 98.1°F | Resp 16 | Wt 124.6 lb

## 2022-06-03 DIAGNOSIS — T451X5A Adverse effect of antineoplastic and immunosuppressive drugs, initial encounter: Secondary | ICD-10-CM | POA: Diagnosis not present

## 2022-06-03 DIAGNOSIS — Z7982 Long term (current) use of aspirin: Secondary | ICD-10-CM | POA: Insufficient documentation

## 2022-06-03 DIAGNOSIS — E119 Type 2 diabetes mellitus without complications: Secondary | ICD-10-CM | POA: Diagnosis not present

## 2022-06-03 DIAGNOSIS — K3184 Gastroparesis: Secondary | ICD-10-CM | POA: Insufficient documentation

## 2022-06-03 DIAGNOSIS — Z95828 Presence of other vascular implants and grafts: Secondary | ICD-10-CM | POA: Diagnosis not present

## 2022-06-03 DIAGNOSIS — Z923 Personal history of irradiation: Secondary | ICD-10-CM | POA: Diagnosis not present

## 2022-06-03 DIAGNOSIS — C787 Secondary malignant neoplasm of liver and intrahepatic bile duct: Secondary | ICD-10-CM | POA: Insufficient documentation

## 2022-06-03 DIAGNOSIS — K8689 Other specified diseases of pancreas: Secondary | ICD-10-CM | POA: Diagnosis not present

## 2022-06-03 DIAGNOSIS — D6959 Other secondary thrombocytopenia: Secondary | ICD-10-CM | POA: Diagnosis not present

## 2022-06-03 DIAGNOSIS — F17210 Nicotine dependence, cigarettes, uncomplicated: Secondary | ICD-10-CM | POA: Insufficient documentation

## 2022-06-03 DIAGNOSIS — Z79899 Other long term (current) drug therapy: Secondary | ICD-10-CM | POA: Insufficient documentation

## 2022-06-03 DIAGNOSIS — Z794 Long term (current) use of insulin: Secondary | ICD-10-CM | POA: Insufficient documentation

## 2022-06-03 DIAGNOSIS — D6481 Anemia due to antineoplastic chemotherapy: Secondary | ICD-10-CM | POA: Insufficient documentation

## 2022-06-03 DIAGNOSIS — E11621 Type 2 diabetes mellitus with foot ulcer: Secondary | ICD-10-CM | POA: Diagnosis present

## 2022-06-03 DIAGNOSIS — E1142 Type 2 diabetes mellitus with diabetic polyneuropathy: Secondary | ICD-10-CM | POA: Insufficient documentation

## 2022-06-03 DIAGNOSIS — C250 Malignant neoplasm of head of pancreas: Secondary | ICD-10-CM | POA: Diagnosis present

## 2022-06-03 DIAGNOSIS — L97522 Non-pressure chronic ulcer of other part of left foot with fat layer exposed: Secondary | ICD-10-CM | POA: Diagnosis not present

## 2022-06-03 LAB — CBC WITH DIFFERENTIAL (CANCER CENTER ONLY)
Abs Immature Granulocytes: 0.03 10*3/uL (ref 0.00–0.07)
Basophils Absolute: 0.1 10*3/uL (ref 0.0–0.1)
Basophils Relative: 0 %
Eosinophils Absolute: 0 10*3/uL (ref 0.0–0.5)
Eosinophils Relative: 0 %
HCT: 28.9 % — ABNORMAL LOW (ref 39.0–52.0)
Hemoglobin: 10 g/dL — ABNORMAL LOW (ref 13.0–17.0)
Immature Granulocytes: 0 %
Lymphocytes Relative: 3 %
Lymphs Abs: 0.4 10*3/uL — ABNORMAL LOW (ref 0.7–4.0)
MCH: 34.2 pg — ABNORMAL HIGH (ref 26.0–34.0)
MCHC: 34.6 g/dL (ref 30.0–36.0)
MCV: 99 fL (ref 80.0–100.0)
Monocytes Absolute: 0.9 10*3/uL (ref 0.1–1.0)
Monocytes Relative: 8 %
Neutro Abs: 10.5 10*3/uL — ABNORMAL HIGH (ref 1.7–7.7)
Neutrophils Relative %: 89 %
Platelet Count: 179 10*3/uL (ref 150–400)
RBC: 2.92 MIL/uL — ABNORMAL LOW (ref 4.22–5.81)
RDW: 12.4 % (ref 11.5–15.5)
WBC Count: 11.9 10*3/uL — ABNORMAL HIGH (ref 4.0–10.5)
nRBC: 0 % (ref 0.0–0.2)

## 2022-06-03 LAB — CMP (CANCER CENTER ONLY)
ALT: 17 U/L (ref 0–44)
AST: 41 U/L (ref 15–41)
Albumin: 2.7 g/dL — ABNORMAL LOW (ref 3.5–5.0)
Alkaline Phosphatase: 248 U/L — ABNORMAL HIGH (ref 38–126)
Anion gap: 3 — ABNORMAL LOW (ref 5–15)
BUN: 7 mg/dL (ref 6–20)
CO2: 27 mmol/L (ref 22–32)
Calcium: 7.6 mg/dL — ABNORMAL LOW (ref 8.9–10.3)
Chloride: 101 mmol/L (ref 98–111)
Creatinine: 0.66 mg/dL (ref 0.61–1.24)
GFR, Estimated: >60 mL/min (ref >60)
Glucose, Bld: 349 mg/dL — ABNORMAL HIGH (ref 70–99)
Potassium: 4.8 mmol/L (ref 3.5–5.1)
Sodium: 131 mmol/L — ABNORMAL LOW (ref 135–145)
Total Bilirubin: 0.4 mg/dL (ref 0.3–1.2)
Total Protein: 6.5 g/dL (ref 6.5–8.1)

## 2022-06-03 MED ORDER — HEPARIN SOD (PORK) LOCK FLUSH 100 UNIT/ML IV SOLN
500.0000 [IU] | Freq: Once | INTRAVENOUS | Status: AC
  Administered 2022-06-03: 500 [IU]

## 2022-06-03 MED ORDER — SODIUM CHLORIDE 0.9% FLUSH
10.0000 mL | Freq: Once | INTRAVENOUS | Status: AC
  Administered 2022-06-03: 10 mL

## 2022-06-03 NOTE — Progress Notes (Signed)
[place] Telephone:([phone]) [phone]   [phone]  PROGRESS NOTE  Patient Care Team: [name], NP as PCP - General (Nurse Practitioner) Werner Lean, MD as PCP - Cardiology (Cardiology) Armbruster, Carlota Raspberry, MD as Consulting Physician (Gastroenterology) Orson Slick, MD as Consulting Physician (Oncology) Royston Bake, RN as Registered Nurse  Hematological/Oncological History # Adenocarcinoma of the Pancreas, Stage IIB 02/26/2021: patient seen by GI for jaundice and pancreatic mass causing CBD obstruction. CT abdomen showed pancreatic head mass (4.4 x 3.7 cm) and enlarged portocaval lymph nodes. 02/27/2021: ERCP with placement of a nonmetallic biliary stent. FNA of pancreatic head showed malignant cells consistent with pancreatic adenocarcinoma. 03/05/2021: CT Chest showed no evidence of metastatic disease in the chest. 03/12/2021: establish care with Dr. Lorenso Courier 04/10/2021: Cycle 1 Day 1 of FOLFIRINOX 04/24/2021: Cycle 2 Day 1 of FOLFIRINOX 05/22/2021: Cycle 3 Day 1 of FOLFIRINOX. Delayed x 2 weeks due to hospitalization for poor po intake and dehydration due to gastroparesis. 06/05/2021: Cycle 4 Day 1 of FOLFIRINOX 06/19/2021: Cycle 5 Day 1 of FOLFIRINOX 07/02/2021: Cycle 6 Day 1 of FOLFIRINOX 07/16/2021: Cycle 7 Day 1 of FOLFIRINOX  07/30/2021: Cycle 8 Day 1 of FOLFIRINOX  08/13/2021: Cycle 9 Day 1 of FOLFIRINOX  08/27/2021:  Cycle 10 Day 1 of FOLFIRINOX  09/10/2021: Cycle 11 Day 1 of FOLFIRINOX  09/24/2021: Cycle 12 Day 1 of FOLFIRINOX  10/28/2021: Underwent open whipple with portal vein reconstruction utilizing a R deep FV graft at [facility]. Positive margins noted.  02/18/2022: Start of Chemoradiation with Xeloda 850 mg/m2 on radiation days (5 days per week)  04/09/2022: final day of radiation therapy.  05/15/2022: CT scan at [facility] confirms metastatic disease to the liver.   Interval History:  [name] [name] 61 y.o. male  returns for a follow up visit for pancreatic cancer. He was last seen on 04/27/2022. In the interim since his last visit he has completed chemoradiation.   On exam today [name] [name] reports he is recovering well from his [place] inpatient admission.  He reports that he underwent debridement surgery of the wound on his left foot.  Reports an upcoming appointment with the podiatrist.  He has been receiving vancomycin and oral Bactrim in order to help treat this infection.  He notes that his last dose will be on 07/01/2022.  He notes he is not currently having any pain in his foot.  Today he was interested in discussing chemotherapy options moving forward.  He otherwise denies any fevers, chills, sweats, constipation, or redness of his hands or palms.  A full 10 point ROS is listed below.  The bulk of our discussion focused on the Of chemotherapy and the concerns regarding progression, worsening of his foot infection, and his poor toleration of chemotherapy previously.  MEDICAL HISTORY:  Past Medical History:  Diagnosis Date   Barrett's esophagus dx 2016   Bronchitis    Chronic hepatitis C without hepatic coma ([place]) 10/31/2014   Depression    Diabetes ([place])    ED (erectile dysfunction)    GERD (gastroesophageal reflux disease)    Hepatitis C    Hiatal hernia 10/2014   3cm   Neuropathy    pancreatic ca 12/2020   S/P transmetatarsal amputation of foot, left ([place]) 11/28/2020    SURGICAL HISTORY: Past Surgical History:  Procedure Laterality Date   AMPUTATION Left 11/15/2020   Procedure: LEFT TRANSMETATARSALS AMPUTATION;  Surgeon: Newt Minion, MD;  Location: [place];  Service:  Orthopedics;  Laterality: Left;   APPENDECTOMY     BILIARY STENT PLACEMENT N/A 02/27/2021   Procedure: BILIARY STENT PLACEMENT;  Surgeon: Milus Banister, MD;  Location: [facility];  Service: Endoscopy;  Laterality: N/A;   BILIARY STENT PLACEMENT N/A 03/27/2021   Procedure: BILIARY STENT PLACEMENT;  Surgeon: Jackquline Denmark, MD;  Location: [facility];  Service: Endoscopy;  Laterality: N/A;   BIOPSY  03/27/2021   Procedure: BIOPSY;  Surgeon: Jackquline Denmark, MD;  Location: [facility];  Service: Endoscopy;;   COLONOSCOPY N/A 02/16/2014   Procedure: COLONOSCOPY;  Surgeon: Gatha Mayer, MD;  Location: [facility];  Service: Endoscopy;  Laterality: N/A;   COLONOSCOPY     ENDOSCOPIC RETROGRADE CHOLANGIOPANCREATOGRAPHY (ERCP) WITH PROPOFOL N/A 02/27/2021   Procedure: ENDOSCOPIC RETROGRADE CHOLANGIOPANCREATOGRAPHY (ERCP) WITH PROPOFOL;  Surgeon: Milus Banister, MD;  Location: [facility];  Service: Endoscopy;  Laterality: N/A;   ERCP N/A 03/27/2021   Procedure: ENDOSCOPIC RETROGRADE CHOLANGIOPANCREATOGRAPHY (ERCP);  Surgeon: Jackquline Denmark, MD;  Location: [facility];  Service: Endoscopy;  Laterality: N/A;   ESOPHAGOGASTRODUODENOSCOPY (EGD) WITH PROPOFOL N/A 05/06/2021   Procedure: ESOPHAGOGASTRODUODENOSCOPY (EGD) WITH PROPOFOL;  Surgeon: Mauri Pole, MD;  [facility]: [facility];  Service: Endoscopy;  Laterality: N/A;   EUS N/A 02/27/2021   Procedure: UPPER ENDOSCOPIC ULTRASOUND (EUS) RADIAL;  Surgeon: Milus Banister, MD;  Location: [facility];  Service: Endoscopy;  Laterality: N/A;   FINE NEEDLE ASPIRATION N/A 02/27/2021   Procedure: FINE NEEDLE ASPIRATION (FNA) LINEAR;  Surgeon: Milus Banister, MD;  Location: [facility];  Service: Endoscopy;  Laterality: N/A;   IR IMAGING GUIDED PORT INSERTION  03/21/2021   SPHINCTEROTOMY  02/27/2021   Procedure: SPHINCTEROTOMY;  Surgeon: Milus Banister, MD;  Location: [facility];  Service: Endoscopy;;   STENT REMOVAL  03/27/2021   Procedure: STENT REMOVAL;  Surgeon: Jackquline Denmark, MD;  [facility];  Service: Endoscopy;;   TONSILLECTOMY      SOCIAL HISTORY: Social History   Socioeconomic History   Marital status: Single    Spouse name: Not on file   Number of children: Not on file   Years of education: Not on file   Highest education level:  Not on file  Occupational History   Not on file  Tobacco Use   Smoking status: Every Day    Packs/day: 0.50    Years: 35.00    Total pack years: 17.50    Types: Cigarettes   Smokeless tobacco: Never   Tobacco comments:    Currently being followed by the smoking cessation program   Vaping Use   Vaping Use: Never used  Substance and Sexual Activity   Alcohol use: Not Currently    Comment: previous   Drug use: No   Sexual activity: Not on file  Other Topics Concern   Not on file  Social History Narrative   Not on file   Social Determinants of Health   Financial Resource Strain: High Risk (03/13/2021)   Overall Financial Resource Strain (CARDIA)    Difficulty of Paying Living Expenses: Very hard  Food Insecurity: Food Insecurity Present (05/15/2021)   Hunger Vital Sign    Worried About Running Out of Food in the Last Year: Sometimes true    Ran Out of Food in the Last Year: Sometimes true  Transportation Needs: Unmet Transportation Needs (03/13/2021)   PRAPARE - Hydrologist (Medical): Yes    Lack of Transportation (Non-Medical): Yes  Physical Activity: Not on file  Stress: Not on file  Social Connections: Not on file  Intimate Partner Violence: Not At Risk (12/02/2021)   Humiliation, Afraid, Rape, and Kick questionnaire    Fear of Current or Ex-Partner: No    Emotionally Abused: No    Physically Abused: No    Sexually Abused: No    FAMILY HISTORY: Family History  Problem Relation Age of Onset   Breast cancer Mother    Stroke Father    Alcohol abuse Father    Heart disease Maternal Grandfather    Pancreatic cancer Paternal Grandmother    Diabetes Paternal Grandfather    Colon cancer Neg Hx    Stomach cancer Neg Hx    Rectal cancer Neg Hx    Esophageal cancer Neg Hx     ALLERGIES:  is allergic to bee venom and lactose intolerance (gi).  MEDICATIONS:  Current Outpatient Medications  Medication Sig Dispense Refill   pregabalin  (LYRICA) 75 MG capsule Take 75 mg by mouth daily.     Accu-Chek Softclix Lancets lancets Check blood glucose level by fingerstick 3-4 times per day. 100 each 3   acetaminophen (TYLENOL) 500 MG tablet Take 1 tablet by mouth every 6 hours as needed for moderate pain. 60 tablet 3   albuterol (VENTOLIN HFA) 108 (90 Base) MCG/ACT inhaler Inhale 2 puffs into the lungs every 6 hours as needed for wheezing or shortness of breath. 18 g 0   aspirin EC 81 MG tablet Take 1 tablet by mouth daily. 100 tablet 0   atorvastatin (LIPITOR) 20 MG tablet Take 1 tablet by mouth daily. 90 tablet 1   Continuous Blood Gluc Sensor (FREESTYLE LIBRE 2 SENSOR) MISC Use 1 sensor every 2 weeks as advised 6 each 3   diphenoxylate-atropine (LOMOTIL) 2.5-0.025 MG tablet Take 1 tablet by mouth 4 times daily as needed for diarrhea or loose stools. 60 tablet 1   DULoxetine (CYMBALTA) 60 MG capsule Take 1 capsule (60 mg total) by mouth daily. 90 capsule 1   EPINEPHrine 0.3 mg/0.3 mL IJ SOAJ injection Inject 0.3 mg into the muscle once as needed for anaphylaxis. (Patient not taking: Reported on 06/01/2022) 2 each 1   escitalopram (LEXAPRO) 10 MG tablet Take 1 tablet (10 mg total) by mouth daily. 90 tablet 1   folic acid (FOLVITE) 1 MG tablet Take 1 tablet (1 mg total) by mouth in the morning.     glucose blood (ACCU-CHEK GUIDE) test strip Check blood glucose level by fingerstick 3 - 4 times per day. 100 each 12   insulin degludec (TRESIBA FLEXTOUCH) 100 UNIT/ML FlexTouch Pen Inject 24 Units into the skin daily. (Patient taking differently: Inject 7 Units into the skin daily.) 9 mL 3   Insulin Lispro-aabc (LYUMJEV KWIKPEN) 100 UNIT/ML KwikPen Inject 5-8 Units into the skin 3 (three) times daily before meals. (Patient taking differently: Inject 3 Units into the skin 3 (three) times daily before meals.) 15 mL 3   Insulin Pen Needle (TRUEPLUS PEN NEEDLES) 32G X 4 MM MISC Use as instructed. Inject into the skin twice daily 200 each 6    levofloxacin (LEVAQUIN) 750 MG tablet Take 750 mg by mouth daily.     lidocaine-prilocaine (EMLA) cream Apply topically as directed when needed. (Patient not taking: Reported on 06/01/2022) 30 g 0   lipase/protease/amylase (CREON) 36000 UNITS CPEP capsule Take 1 capsule (36,000 Units total) by mouth 3 (three) times daily with meals. May take 1 additional capsule with snacks 270 capsule 3   methadone (DOLOPHINE)  5 MG tablet Take 1 tablet (5 mg total) by mouth every 12 (twelve) hours for 30 days 60 tablet 0   mirtazapine (REMERON) 30 MG tablet Take 1 tablet (30 mg total) by mouth at bedtime. 90 tablet 0   Multiple Vitamins-Minerals (THEREMS-M) TABS Take 1 tablet by mouth in the morning. 90 tablet 3   mupirocin ointment (BACTROBAN) 2 % Apply to wound surface with each dressing change. 22 g 0   nicotine (NICODERM CQ - DOSED IN MG/24 HOURS) 21 mg/24hr patch Apply one patch daily for 12 weeks. 84 patch 1   ondansetron (ZOFRAN) 8 MG tablet Take 1 tablet (8 mg total) by mouth 2 (two) times daily as needed. Start on day 3 after chemotherapy. 30 tablet 1   oxyCODONE (OXY IR/ROXICODONE) 5 MG immediate release tablet Take 1 to 2 tablets by mouth every 6 hours as needed for severe pain. (Patient not taking: Reported on 06/01/2022) 120 tablet 0   oxyCODONE ER (XTAMPZA ER) 9 MG C12A Take 1 capsule by mouth every 12 hours. 60 capsule 0   pantoprazole (PROTONIX) 40 MG tablet Take 1 tablet by mouth daily. 90 tablet 1   prochlorperazine (COMPAZINE) 10 MG tablet Take 1 tablet (10 mg total) by mouth every 6 (six) hours as needed for nausea or vomiting. 30 tablet 1   VANCOMYCIN HCL IV Inject into the vein.     varenicline (APO-VARENICLINE) 1 MG tablet Take 1 tablet by mouth once daily in the morning with food 84 tablet 1   zolpidem (AMBIEN) 10 MG tablet Take 1 tablet (10 mg total) by mouth at bedtime as needed for sleep. 30 tablet 1   No current facility-administered medications for this visit.    REVIEW OF SYSTEMS:    Constitutional: ( - ) fevers, ( - )  chills , ( - ) night sweats Eyes: ( - ) blurriness of vision, ( - ) double vision, ( - ) watery eyes Ears, nose, mouth, throat, and face: ( - ) mucositis, ( - ) sore throat Respiratory: ( - ) cough, ( - ) dyspnea, ( - ) wheezes Cardiovascular: ( - ) palpitation, ( - ) chest discomfort, ( - ) lower extremity swelling Gastrointestinal:  ( +) nausea, ( - ) heartburn, ( - ) change in bowel habits Skin: ( - ) abnormal skin rashes Lymphatics: ( - ) new lymphadenopathy, ( - ) easy bruising Neurological: ( - ) numbness, ( - ) tingling, ( - ) new weaknesses Behavioral/Psych: ( - ) mood change, ( - ) new changes  All other systems were reviewed with the patient and are negative.  PHYSICAL EXAMINATION: ECOG PERFORMANCE STATUS: 1 - Symptomatic but completely ambulatory  Vitals:   06/03/22 1258  BP: 125/82  Pulse: 89  Resp: 16  Temp: 98.1 F (36.7 C)  SpO2: 100%   Filed Weights   06/03/22 1258  Weight: 124 lb 9.6 oz (56.5 kg)    GENERAL: Well-appearing middle-age Caucasian male, alert, no distress and comfortable HEAD: Anterior laceration of the scalp, well healed  SKIN: skin color, texture, turgor are normal, no rashes or significant lesions.  EYES: conjunctiva are pink and non-injected, sclera clear LUNGS: clear to auscultation and percussion with normal breathing effort HEART: regular rate & rhythm and no murmurs and no lower extremity edema ABDOMEN: Midline incision healing. Well healed lesion on right abdomen.  EXTREMITIES: wound currently dressed.  PSYCH: alert & oriented x 3, fluent speech NEURO: no focal motor/sensory deficits  LABORATORY DATA:  I have reviewed the data as listed    Latest Ref Rng & Units 06/03/2022   11:52 AM 04/27/2022    9:27 AM 04/07/2022   10:10 AM  CBC  WBC 4.0 - 10.5 K/uL 11.9  4.0  6.9   Hemoglobin 13.0 - 17.0 g/dL 10.0  11.3  12.5   Hematocrit 39.0 - 52.0 % 28.9  31.1  35.6   Platelets 150 - 400 K/uL 179  122   144        Latest Ref Rng & Units 06/03/2022   11:52 AM 04/27/2022    9:27 AM 04/07/2022   10:10 AM  CMP  Glucose 70 - 99 mg/dL 349  247  378   BUN 6 - 20 mg/dL '7  8  8   '$ Creatinine 0.61 - 1.24 mg/dL 0.66  0.39  0.54   Sodium 135 - 145 mmol/L 131  131  134   Potassium 3.5 - 5.1 mmol/L 4.8  4.0  4.1   Chloride 98 - 111 mmol/L 101  99  100   CO2 22 - 32 mmol/L '27  27  29   '$ Calcium 8.9 - 10.3 mg/dL 7.6  8.1  8.3   Total Protein 6.5 - 8.1 g/dL 6.5  6.5  7.4   Total Bilirubin 0.3 - 1.2 mg/dL 0.4  0.5  0.5   Alkaline Phos 38 - 126 U/L 248  240  162   AST 15 - 41 U/L 41  46  24   ALT 0 - 44 U/L '17  18  12    '$ PATHOLOGY:  Procedure:    Pancreaticoduodenectomy (Whipple resection), partial pancreatectomy   TUMOR     Tumor Site:    Pancreatic head     Histologic Type:    Ductal adenocarcinoma (NOS)     Histologic Grade:    G2, moderately differentiated     Tumor Size:    Greatest Dimension (Centimeters): 3.7 cm       Additional Dimension (Centimeters):    2.6 cm       Additional Dimension (Centimeters):    2.1 cm     Site(s) Involved by Direct Tumor Extension:    Ampulla of Vater or sphincter of Oddi     Site(s) Involved by Direct Tumor Extension:    Duodenal wall     Site(s) Involved by Direct Tumor Extension:    Superior mesenteric vein     Treatment Effect:    Absent, with extensive residual cancer and no evident tumor regression (poor or no response, score 3)     Lymphovascular Invasion:    Present     Perineural Invasion:    Present   MARGINS     Margin Status for Invasive Carcinoma:    Invasive carcinoma present at margin       Margin(s) Involved by Invasive Carcinoma:    Pancreatic neck / parenchymal       Margin(s) Involved by Invasive Carcinoma:    Uncinate (retroperitoneal / superior mesenteric artery)     Margin Status for Dysplasia and Intraepithelial Neoplasia:    All margins negative for dysplasia and intraepithelial neoplasia   REGIONAL LYMPH NODES     Regional Lymph  Node Status:           :    All regional lymph nodes negative for tumor       Number of Lymph Nodes Examined:    36   PATHOLOGIC STAGE CLASSIFICATION  (pTNM, AJCC 8th Edition)  Reporting of pT, pN, and (when applicable) pM categories is based on information available to the pathologist at the time the report is issued. As per the AJCC (Chapter 1, 8th Ed.) it is the managing physician's responsibility to establish the final pathologic stage based upon all pertinent information, including but potentially not limited to this pathology report.     TNM Descriptors:    y (post-treatment)     pT Category:    pT2     pN Category:    pN0   RADIOGRAPHIC STUDIES: I have personally reviewed the radiological images as listed and agreed with the findings in the report: Borderline resectable pancreatic mass with no evidence of metastatic disease. No results found.  ASSESSMENT & PLAN EMANUAL LAMOUNTAIN 61 y.o. male with medical history significant for borderline resectable pancreatic cancer presents for a follow up visit.   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  The patient does still have an elevation in bilirubin which likely represents a decline from the time of the latest stent placement.  He was evaluated by surgery who determined this is a borderline resectable tumor.  We will plan to start the patient on neoadjuvant chemotherapy.  The 2 options would be Gem/Abraxane or FOLFIRINOX.  Given his excellent functional status I do believe he would be a good candidate for FOLFIRINOX.  He completed 12 cycles of neoadjuvant FOLFIRINOX.  He underwent open whipple with portal vein reconstruction utilizing a R deep FV graft on 10/28/2021 at [facility].  Due to positive margins we will plan to treat with chemoradiation moving forward. The treatment of choice will be Xeloda 850 mg/m2  on radiation days (5 days per week) over the course of radiation.    # Adenocarcinoma of the Pancreas, Stage IIB -- At this time disease appears borderline resectable.  We will proceed with neoadjuvant chemotherapy.  At this time would prefer a regimen of FOLFIRINOX given his good baseline health. -- case reviewed by surgery, who agrees his disease is borderline resectable.  -- baseline elevations in both CA 19-9 and CEA. Continue to monitor during treatment.  -- Cycle 1 Day 1 of FOLFIRINOX started 04/10/2021. Cycle 12 Day 1 on 09/24/2021, completed neoadjuvant chemotherapy. --Underwent open whipple with portal vein reconstruction utilizing a R deep FV graft on 10/28/2021 at [facility]. Noted to have positive margins, adjuvant chemoradiation was recommended. Plan: --completed chemoradiation with Xeloda on 04/09/2022 --CT scan on 05/15/2022 at [facility] showed metastatic disease to the liver.  --Patient would like to start gemcitabine monotherapy, but also wants time to recover from his foot infection.  He is asked to delay the start for 4 weeks.  I discussed with him that he may have worsening progression in that 4 weeks but that we can reassess and determine then the next best steps. --Additionally I am concerned that his foot infection may worsen when starting chemotherapy.  He voiced his understanding of this. --RTC in 4 weeks  # Leukocytosis # Infected Foot Wound -- White blood cell count elevated to 11.9 today. -- Patient currently on vancomycin therapy IV as prescribed by [facility] as well as oral Bactrim. --Per patient request, delaying the start of chemotherapy for him to complete more antibiotic therapy and recover. -- Continue to monitor the patient's infection.   #Gastroparesis #Nausea/Vomiting/Poor PO Intake #Diarrhea --patient advanced diet to solid foods.  --nausea  meds/antidiarrheal as below. --continue to work on glycemic control --continue to  monitor.   #Chemotherapy induced Anemia and Thrombocytopenia --Hgb is stable at 10.0, stable. continue to monitor --Plt stable at 179 K  --continue to monitor   # Pain Control -- Patient currently taking ibuprofen for his abdominal pain --continue oxycodone 10 mg every 6 hours as needed. He has consistently been taking '40mg'$  PO daily total. --continue Xtampza 9 mg q 12H   #Hyperglycemia: --today's glucose level is 349 --insulin-requiring type 2 DM --under the care of endocrinology  #Left foot wound: -- He has established care with wound care center -- Foot wound was observed today and does not appear to be actively infected.  No erythema or tenderness.  No purulent discharge.  #Supportive Care -- chemotherapy education complete -- port placed -- zofran '8mg'$  q8H PRN and compazine '10mg'$  PO q6H for nausea --Creon to help with loose stools. --lomotil q 6 H PRN for diarrhea.  -- EMLA cream for port  No orders of the defined types were placed in this encounter.  All questions were answered. The patient knows to call the clinic with any problems, questions or concerns.  I have spent a total of 30 minutes minutes of face-to-face and non-face-to-face time, preparing to see the patient,  performing a medically appropriate examination, counseling and educating the patient, ordering tests/procedures, documenting clinical information in the electronic health record and care coordination.   Ledell Peoples, MD Department of Hematology/Oncology [place] at [facility] Phone: [phone] Pager: [phone] Email: [email or web address]

## 2022-06-04 ENCOUNTER — Telehealth: Payer: Self-pay

## 2022-06-04 ENCOUNTER — Telehealth: Payer: Self-pay | Admitting: Hematology and Oncology
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
## 2022-06-04 NOTE — Telephone Encounter (Signed)
Thanks Cassie 

## 2022-06-04 NOTE — Telephone Encounter (Signed)
Thank you Loma Sousa!  Dr. Manuela Schwartz. We are working with [facility] pharmacist to manage patient's IV antibiotics.

## 2022-06-04 NOTE — Telephone Encounter (Signed)
Returning call from [place], PharmD at [name]. Patient is on OPAT for osteomyelitis. Vancomycin trough obtained on 9/28 which resulted as 18.1. Per chart notes, vancomycin trough was timed appropriately. Patient is currently on vancomycin 1g IV q12h per chart notes. Other pertinent labs and vitals include serum creatinine of 0.6, height of 71 inches and weight of 56.5 kg. Calculated an estimated AUC using total body weight since less than ideal body weight, Vd of 0.72 L/kg and rounded serum creatinine to 0.8. Adjusting vancomycin dosing to 1500 mg IV q24 hours will provide an eAUC of 524. Communicated these recommendations to [place]. [place] will adjust patient's vancomycin accordingly.  Eliseo Gum, PharmD PGY1 Pharmacy Resident   06/04/2022  9:37 AM

## 2022-06-04 NOTE — Telephone Encounter (Signed)
Per 10/4  los called and left message for pt about appointment

## 2022-06-08 ENCOUNTER — Telehealth: Payer: Self-pay

## 2022-06-08 NOTE — Telephone Encounter (Signed)
Attempted to contact patient to schedule a Palliative Care consult appointment. No answer left a message to return call.  

## 2022-06-10 ENCOUNTER — Other Ambulatory Visit (HOSPITAL_COMMUNITY): Payer: Self-pay

## 2022-06-10 ENCOUNTER — Other Ambulatory Visit: Payer: Self-pay | Admitting: Physician Assistant

## 2022-06-10 ENCOUNTER — Other Ambulatory Visit: Payer: Self-pay | Admitting: Hematology and Oncology

## 2022-06-10 MED ORDER — OXYCODONE HCL 5 MG PO TABS
5.0000 mg | ORAL_TABLET | Freq: Four times a day (QID) | ORAL | 0 refills | Status: DC | PRN
  Filled 2022-06-10 – 2022-06-15 (×2): qty 120, 15d supply, fill #0

## 2022-06-11 ENCOUNTER — Encounter: Payer: Self-pay | Admitting: Hematology and Oncology

## 2022-06-11 ENCOUNTER — Other Ambulatory Visit (HOSPITAL_COMMUNITY): Payer: Self-pay

## 2022-06-11 ENCOUNTER — Telehealth: Payer: Self-pay

## 2022-06-11 ENCOUNTER — Other Ambulatory Visit: Payer: Self-pay

## 2022-06-11 ENCOUNTER — Encounter (HOSPITAL_BASED_OUTPATIENT_CLINIC_OR_DEPARTMENT_OTHER): Payer: Medicaid Other | Admitting: General Surgery

## 2022-06-11 NOTE — Telephone Encounter (Signed)
Spoke with patient and scheduled a Mychart Palliative Consult for 06/15/22 @ 1:30 PM.  Consent obtained; updated Netsmart, Team List and Epic.

## 2022-06-12 ENCOUNTER — Other Ambulatory Visit (HOSPITAL_COMMUNITY): Payer: Self-pay

## 2022-06-12 ENCOUNTER — Other Ambulatory Visit: Payer: Self-pay | Admitting: *Deleted

## 2022-06-12 ENCOUNTER — Ambulatory Visit (INDEPENDENT_AMBULATORY_CARE_PROVIDER_SITE_OTHER): Payer: Medicaid Other

## 2022-06-12 ENCOUNTER — Ambulatory Visit: Payer: Medicaid Other | Admitting: Podiatry

## 2022-06-12 DIAGNOSIS — M86072 Acute hematogenous osteomyelitis, left ankle and foot: Secondary | ICD-10-CM

## 2022-06-12 DIAGNOSIS — M7989 Other specified soft tissue disorders: Secondary | ICD-10-CM | POA: Diagnosis not present

## 2022-06-12 DIAGNOSIS — L0889 Other specified local infections of the skin and subcutaneous tissue: Secondary | ICD-10-CM

## 2022-06-12 DIAGNOSIS — M216X2 Other acquired deformities of left foot: Secondary | ICD-10-CM | POA: Diagnosis not present

## 2022-06-12 DIAGNOSIS — S91309A Unspecified open wound, unspecified foot, initial encounter: Secondary | ICD-10-CM

## 2022-06-12 DIAGNOSIS — M79672 Pain in left foot: Secondary | ICD-10-CM

## 2022-06-12 NOTE — Progress Notes (Signed)
Subjective:   Patient ID: [name] [name], male   DOB: 61 y.o.   MRN: [id number]   HPI Chief Complaint  Patient presents with   Diabetic Ulcer    Left foot diabetic ulcer, rate of pain 2 out of 10,  A1c- 7.4BG-189 seeing wound care, IDC, [place]: vancomycin    61 year old male presents the above concerns.  He previous had a transmetatarsal amputation performed in 2022.  He states that he did try to increase his walking and developed a blister on the right foot.  He was seen at [facility] and found of osteomyelitis and he underwent a debridement, I&D.  It appears to be on May 20, 2022.  The patient lives in [place] and has transferred his care to [place].  He has follow-up with infectious disease and he is following up with vascular surgery next week.  He also has appoint with wound care next week.  He is currently on vancomycin and levofloxacin for 6 weeks ending on July 01, 2022.  He has been keeping antibiotic ointment on the wound  Previously underwent a Whipple procedure for pancreatic cancer  MRI 9/16 Impression:  Status post TMA with medial plantar ulcer and sinus tract extending to the  first metatarsal osseous surgical margin where there is dorsal periosteal  abscess/phlegmon and osteomyelitis involving the residual first and second  metatarsals. See discussion above.     Review of Systems  All other systems reviewed and are negative.  Past Medical History:  Diagnosis Date   Barrett's esophagus dx 2016   Bronchitis    Chronic hepatitis C without hepatic coma ([place]) 10/31/2014   Depression    Diabetes [facility])    ED (erectile dysfunction)    GERD (gastroesophageal reflux disease)    Hepatitis C    Hiatal hernia 10/2014   3cm   Neuropathy    pancreatic ca 12/2020   S/P transmetatarsal amputation of foot, left ([place]) 11/28/2020    Past Surgical History:  Procedure Laterality Date   AMPUTATION Left 11/15/2020   Procedure: LEFT TRANSMETATARSALS  AMPUTATION;  Surgeon: Newt Minion, MD;  Location: [place];  Service: Orthopedics;  Laterality: Left;   APPENDECTOMY     BILIARY STENT PLACEMENT N/A 02/27/2021   Procedure: BILIARY STENT PLACEMENT;  Surgeon: Milus Banister, MD;  Location: [facility];  Service: Endoscopy;  Laterality: N/A;   BILIARY STENT PLACEMENT N/A 03/27/2021   Procedure: BILIARY STENT PLACEMENT;  Surgeon: Jackquline Denmark, MD;  Location: [facility];  Service: Endoscopy;  Laterality: N/A;   BIOPSY  03/27/2021   Procedure: BIOPSY;  Surgeon: Jackquline Denmark, MD;  Location: [facility];  Service: Endoscopy;;   COLONOSCOPY N/A 02/16/2014   Procedure: COLONOSCOPY;  Surgeon: Gatha Mayer, MD;  Location: [facility];  Service: Endoscopy;  Laterality: N/A;   COLONOSCOPY     ENDOSCOPIC RETROGRADE CHOLANGIOPANCREATOGRAPHY (ERCP) WITH PROPOFOL N/A 02/27/2021   Procedure: ENDOSCOPIC RETROGRADE CHOLANGIOPANCREATOGRAPHY (ERCP) WITH PROPOFOL;  Surgeon: Milus Banister, MD;  Location: [facility];  Service: Endoscopy;  Laterality: N/A;   ERCP N/A 03/27/2021   Procedure: ENDOSCOPIC RETROGRADE CHOLANGIOPANCREATOGRAPHY (ERCP);  Surgeon: Jackquline Denmark, MD;  Location[facility];  Service: Endoscopy;  Laterality: N/A;   ESOPHAGOGASTRODUODENOSCOPY (EGD) WITH PROPOFOL N/A 05/06/2021   Procedure: ESOPHAGOGASTRODUODENOSCOPY (EGD) WITH PROPOFOL;  Surgeon: Mauri Pole, MD;  Location: [facility];  Service: Endoscopy;  Laterality: N/A;   EUS N/A 02/27/2021   Procedure: UPPER ENDOSCOPIC ULTRASOUND (EUS) RADIAL;  Surgeon: Milus Banister, MD;  Location: [facility];  Service: Endoscopy;  Laterality: N/A;   FINE NEEDLE ASPIRATION N/A 02/27/2021   Procedure: FINE NEEDLE ASPIRATION (FNA) LINEAR;  Surgeon: Milus Banister, MD;  Location: [facility];  Service: Endoscopy;  Laterality: N/A;   IR IMAGING GUIDED PORT INSERTION  03/21/2021   SPHINCTEROTOMY  02/27/2021   Procedure: SPHINCTEROTOMY;  Surgeon: Milus Banister, MD;  Location: [facility];  Service: Endoscopy;;   STENT REMOVAL  03/27/2021   Procedure: STENT REMOVAL;  Surgeon: Jackquline Denmark, MD;  Location: [facility];  Service: Endoscopy;;   TONSILLECTOMY       Current Outpatient Medications:    Accu-Chek Softclix Lancets lancets, Check blood glucose level by fingerstick 3-4 times per day., Disp: 100 each, Rfl: 3   acetaminophen (TYLENOL) 500 MG tablet, Take 1 tablet by mouth every 6 hours as needed for moderate pain., Disp: 60 tablet, Rfl: 3   albuterol (VENTOLIN HFA) 108 (90 Base) MCG/ACT inhaler, Inhale 2 puffs into the lungs every 6 hours as needed for wheezing or shortness of breath., Disp: 18 g, Rfl: 0   aspirin EC 81 MG tablet, Take 1 tablet by mouth daily., Disp: 100 tablet, Rfl: 0   atorvastatin (LIPITOR) 20 MG tablet, Take 1 tablet by mouth daily., Disp: 90 tablet, Rfl: 1   Continuous Blood Gluc Sensor (FREESTYLE LIBRE 2 SENSOR) MISC, Use 1 sensor every 2 weeks as advised, Disp: 6 each, Rfl: 3   diphenoxylate-atropine (LOMOTIL) 2.5-0.025 MG tablet, Take 1 tablet by mouth 4 times daily as needed for diarrhea or loose stools., Disp: 60 tablet, Rfl: 1   DULoxetine (CYMBALTA) 60 MG capsule, Take 1 capsule (60 mg total) by mouth daily., Disp: 90 capsule, Rfl: 1   EPINEPHrine 0.3 mg/0.3 mL IJ SOAJ injection, Inject 0.3 mg into the muscle once as needed for anaphylaxis. (Patient not taking: Reported on 06/01/2022), Disp: 2 each, Rfl: 1   escitalopram (LEXAPRO) 10 MG tablet, Take 1 tablet (10 mg total) by mouth daily., Disp: 90 tablet, Rfl: 1   folic acid (FOLVITE) 1 MG tablet, Take 1 tablet (1 mg total) by mouth in the morning., Disp: , Rfl:    glucose blood (ACCU-CHEK GUIDE) test strip, Check blood glucose level by fingerstick 3 - 4 times per day., Disp: 100 each, Rfl: 12   insulin degludec (TRESIBA FLEXTOUCH) 100 UNIT/ML FlexTouch Pen, Inject 24 Units into the skin daily. (Patient taking differently: Inject 7 Units into the skin daily.), Disp: 9 mL, Rfl: 3   Insulin  Lispro-aabc (LYUMJEV KWIKPEN) 100 UNIT/ML KwikPen, Inject 5-8 Units into the skin 3 (three) times daily before meals. (Patient taking differently: Inject 3 Units into the skin 3 (three) times daily before meals.), Disp: 15 mL, Rfl: 3   Insulin Pen Needle (TRUEPLUS PEN NEEDLES) 32G X 4 MM MISC, Use as instructed. Inject into the skin twice daily, Disp: 200 each, Rfl: 6   levofloxacin (LEVAQUIN) 750 MG tablet, Take 750 mg by mouth daily., Disp: , Rfl:    lidocaine-prilocaine (EMLA) cream, Apply topically as directed when needed. (Patient not taking: Reported on 06/01/2022), Disp: 30 g, Rfl: 0   lipase/protease/amylase (CREON) 36000 UNITS CPEP capsule, Take 1 capsule (36,000 Units total) by mouth 3 (three) times daily with meals. May take 1 additional capsule with snacks, Disp: 270 capsule, Rfl: 3   methadone (DOLOPHINE) 5 MG tablet, Take 1 tablet (5 mg total) by mouth every 12 (twelve) hours for 30 days, Disp: 60 tablet, Rfl: 0   mirtazapine (REMERON) 30 MG tablet,  Take 1 tablet (30 mg total) by mouth at bedtime., Disp: 90 tablet, Rfl: 0   Multiple Vitamins-Minerals (THEREMS-M) TABS, Take 1 tablet by mouth in the morning., Disp: 90 tablet, Rfl: 3   mupirocin ointment (BACTROBAN) 2 %, Apply to wound surface with each dressing change., Disp: 22 g, Rfl: 0   nicotine (NICODERM CQ - DOSED IN MG/24 HOURS) 21 mg/24hr patch, Apply one patch daily for 12 weeks., Disp: 84 patch, Rfl: 1   ondansetron (ZOFRAN) 8 MG tablet, Take 1 tablet (8 mg total) by mouth 2 (two) times daily as needed. Start on day 3 after chemotherapy., Disp: 30 tablet, Rfl: 1   oxyCODONE (OXY IR/ROXICODONE) 5 MG immediate release tablet, Take 1 to 2 tablets by mouth every 6 hours as needed for severe pain., Disp: 120 tablet, Rfl: 0   oxyCODONE ER (XTAMPZA ER) 9 MG C12A, Take 1 capsule by mouth every 12 hours., Disp: 60 capsule, Rfl: 0   pantoprazole (PROTONIX) 40 MG tablet, Take 1 tablet by mouth daily., Disp: 90 tablet, Rfl: 1   pregabalin  (LYRICA) 75 MG capsule, Take 75 mg by mouth daily., Disp: , Rfl:    prochlorperazine (COMPAZINE) 10 MG tablet, Take 1 tablet (10 mg total) by mouth every 6 (six) hours as needed for nausea or vomiting., Disp: 30 tablet, Rfl: 1   VANCOMYCIN HCL IV, Inject into the vein., Disp: , Rfl:    varenicline (APO-VARENICLINE) 1 MG tablet, Take 1 tablet by mouth once daily in the morning with food, Disp: 84 tablet, Rfl: 1   zolpidem (AMBIEN) 10 MG tablet, Take 1 tablet (10 mg total) by mouth at bedtime as needed for sleep., Disp: 30 tablet, Rfl: 1  Allergies  Allergen Reactions   Bee Venom Anaphylaxis   Lactose Intolerance (Gi) Other (See Comments)    GI upset          Objective:  Physical Exam  General: AAO x3, NAD  Dermatological: Left foot there is a full-thickness wound present on the dorsal aspect the foot and the incision is intact with sutures.  There is edema there is no significant cellulitis but there is no drainage or pus.  No fluctuance or crepitation noted today.  No frank purulence.  On the right second toe there is localized edema and superficial wound present dorsal PIPJ and distal second toe.  There is no probing to bone or joint.  No fluctuance or crepitation.  Vascular: Dorsalis Pedis artery and Posterior Tibial artery pedal pulses are decreased bilateral with immedate capillary fill time. There is no pain with calf compression, swelling, warmth, erythema.   Neruologic: Sensation decreased.  Musculoskeletal: No gross boney pedal deformities bilateral. No pain, crepitus, or limitation noted with foot and ankle range of motion bilateral. Muscular strength 5/5 in all groups tested bilateral.  Gait: Unassisted, Nonantalgic.       Assessment:   Osteomyelitis, infection status post debridement; right second toe swelling     Plan:  -Treatment options discussed including all alternatives, risks, and complications -Etiology of symptoms were discussed -X-rays were obtained and  reviewed with the patient.  Previous resections noted along the left first and second rays.  Concern for residual osteomyelitis at the amputation site. -Has had the patient is already on antibiotics per infectious disease and to.  Has follow-up with vascular surgeon next week as well as wound care center.  Today applied Maxorb to the wound followed by dressing and discussed daily dressing changes.  Unfortunately may need further surgery,  debridement or amputation.  It appears that the amputation was discussed with him in the hospital as well. -Monitor the right second toe.  Already on antibiotics.  Return in about 10 days (around 06/22/2022).  Suture movable  Trula Slade DPM

## 2022-06-15 ENCOUNTER — Telehealth: Payer: Medicaid Other | Admitting: Family Medicine

## 2022-06-15 ENCOUNTER — Other Ambulatory Visit (HOSPITAL_COMMUNITY): Payer: Self-pay

## 2022-06-15 ENCOUNTER — Encounter: Payer: Self-pay | Admitting: Hematology and Oncology

## 2022-06-15 ENCOUNTER — Other Ambulatory Visit: Payer: Self-pay | Admitting: Physician Assistant

## 2022-06-16 ENCOUNTER — Other Ambulatory Visit (HOSPITAL_COMMUNITY): Payer: Self-pay

## 2022-06-16 ENCOUNTER — Ambulatory Visit (HOSPITAL_BASED_OUTPATIENT_CLINIC_OR_DEPARTMENT_OTHER): Payer: Medicaid Other | Admitting: General Surgery

## 2022-06-18 ENCOUNTER — Inpatient Hospital Stay: Payer: Medicaid Other

## 2022-06-18 ENCOUNTER — Encounter: Payer: Self-pay | Admitting: Hematology and Oncology

## 2022-06-18 DIAGNOSIS — C250 Malignant neoplasm of head of pancreas: Secondary | ICD-10-CM

## 2022-06-18 DIAGNOSIS — Z95828 Presence of other vascular implants and grafts: Secondary | ICD-10-CM

## 2022-06-18 LAB — CBC WITH DIFFERENTIAL (CANCER CENTER ONLY)
Abs Immature Granulocytes: 0.02 10*3/uL (ref 0.00–0.07)
Basophils Absolute: 0.1 10*3/uL (ref 0.0–0.1)
Basophils Relative: 1 %
Eosinophils Absolute: 0 10*3/uL (ref 0.0–0.5)
Eosinophils Relative: 0 %
HCT: 27.7 % — ABNORMAL LOW (ref 39.0–52.0)
Hemoglobin: 9.5 g/dL — ABNORMAL LOW (ref 13.0–17.0)
Immature Granulocytes: 0 %
Lymphocytes Relative: 3 %
Lymphs Abs: 0.3 10*3/uL — ABNORMAL LOW (ref 0.7–4.0)
MCH: 33.5 pg (ref 26.0–34.0)
MCHC: 34.3 g/dL (ref 30.0–36.0)
MCV: 97.5 fL (ref 80.0–100.0)
Monocytes Absolute: 0.9 10*3/uL (ref 0.1–1.0)
Monocytes Relative: 9 %
Neutro Abs: 9.1 10*3/uL — ABNORMAL HIGH (ref 1.7–7.7)
Neutrophils Relative %: 87 %
Platelet Count: 123 10*3/uL — ABNORMAL LOW (ref 150–400)
RBC: 2.84 MIL/uL — ABNORMAL LOW (ref 4.22–5.81)
RDW: 13.1 % (ref 11.5–15.5)
WBC Count: 10.4 10*3/uL (ref 4.0–10.5)
nRBC: 0 % (ref 0.0–0.2)

## 2022-06-18 LAB — CMP (CANCER CENTER ONLY)
ALT: 18 U/L (ref 0–44)
AST: 45 U/L — ABNORMAL HIGH (ref 15–41)
Albumin: 2.4 g/dL — ABNORMAL LOW (ref 3.5–5.0)
Alkaline Phosphatase: 288 U/L — ABNORMAL HIGH (ref 38–126)
Anion gap: 6 (ref 5–15)
BUN: <5 mg/dL — ABNORMAL LOW (ref 6–20)
CO2: 25 mmol/L (ref 22–32)
Calcium: 7.5 mg/dL — ABNORMAL LOW (ref 8.9–10.3)
Chloride: 104 mmol/L (ref 98–111)
Creatinine: 0.56 mg/dL — ABNORMAL LOW (ref 0.61–1.24)
GFR, Estimated: >60 mL/min (ref >60)
Glucose, Bld: 354 mg/dL — ABNORMAL HIGH (ref 70–99)
Potassium: 4.4 mmol/L (ref 3.5–5.1)
Sodium: 135 mmol/L (ref 135–145)
Total Bilirubin: 0.4 mg/dL (ref 0.3–1.2)
Total Protein: 6.3 g/dL — ABNORMAL LOW (ref 6.5–8.1)

## 2022-06-18 MED ORDER — HEPARIN SOD (PORK) LOCK FLUSH 100 UNIT/ML IV SOLN
500.0000 [IU] | Freq: Once | INTRAVENOUS | Status: AC
  Administered 2022-06-18: 500 [IU]

## 2022-06-18 MED ORDER — SODIUM CHLORIDE 0.9% FLUSH
10.0000 mL | Freq: Once | INTRAVENOUS | Status: AC
  Administered 2022-06-18: 10 mL

## 2022-06-18 NOTE — Progress Notes (Signed)
Office Note     CC: Nonhealing left TMA Requesting Provider:  Rosiland Oz, MD  HPI: [name] [name] is a 61 y.o. (12/05/1960) male presenting at the request of .[name], NP nonhealing left TMA  Exam today, [name] [name] was doing well.  A native of [place], he is currently unemployed, battling pancreatic cancer.  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  Life expectancy less than 1 year.  Regarding his left leg-[name] [name] underwent TMA for diabetic foot infection 1 year ago.  He has had difficulty with wound healing.  [name] [name] is being treated at the wound care center weekly, and is performing wound care on his own otherwise.  Care currently includes tight Coban wrap, with dressing underneath.  He is walking on the wound, with no support shoe, and continues to smoke.  The wound is being followed by ID, for which she is receiving IV antibiotics for osteomyelitis.  He underwent recent I&D and debridement at [facility] in September.  The pt is  on a statin for cholesterol management.  The pt is  on a daily aspirin.   Other AC:  - The pt is not on medication for hypertension.   The pt is  diabetic.  Most recent A1c under 7. Tobacco hx: Current smoker  Past Medical History:  Diagnosis Date   Barrett's esophagus dx 2016   Bronchitis    Chronic hepatitis C without hepatic coma ([place]) 10/31/2014   Depression    Diabetes ([place])    ED (erectile dysfunction)    GERD (gastroesophageal reflux disease)    Hepatitis C    Hiatal hernia 10/2014   3cm   Neuropathy    pancreatic ca 12/2020   S/P transmetatarsal amputation of foot, left ([place]) 11/28/2020    Past Surgical History:  Procedure Laterality Date   AMPUTATION Left 11/15/2020   Procedure: LEFT TRANSMETATARSALS AMPUTATION;  Surgeon: Newt Minion, MD;  Location: [facility];  Service: Orthopedics;  Laterality: Left;   APPENDECTOMY     BILIARY STENT PLACEMENT N/A 02/27/2021    Procedure: BILIARY STENT PLACEMENT;  Surgeon: Milus Banister, MD;  [facility];  Service: Endoscopy;  Laterality: N/A;   BILIARY STENT PLACEMENT N/A 03/27/2021   Procedure: BILIARY STENT PLACEMENT;  Surgeon: Jackquline Denmark, MD;  [facility];  Service: Endoscopy;  Laterality: N/A;   BIOPSY  03/27/2021   Procedure: BIOPSY;  Surgeon: Jackquline Denmark, MD;  [facility];  Service: Endoscopy;;   COLONOSCOPY N/A 02/16/2014   Procedure: COLONOSCOPY;  Surgeon: Gatha Mayer, MD;  [facility];  Service: Endoscopy;  Laterality: N/A;   COLONOSCOPY     ENDOSCOPIC RETROGRADE CHOLANGIOPANCREATOGRAPHY (ERCP) WITH PROPOFOL N/A 02/27/2021   Procedure: ENDOSCOPIC RETROGRADE CHOLANGIOPANCREATOGRAPHY (ERCP) WITH PROPOFOL;  Surgeon: Milus Banister, MD;  [facility];  Service: Endoscopy;  Laterality: N/A;   ERCP N/A 03/27/2021   Procedure: ENDOSCOPIC RETROGRADE CHOLANGIOPANCREATOGRAPHY (ERCP);  Surgeon: Jackquline Denmark, MD;  Location[facility];  Service: Endoscopy;  Laterality: N/A;   ESOPHAGOGASTRODUODENOSCOPY (EGD) WITH PROPOFOL N/A 05/06/2021   Procedure: ESOPHAGOGASTRODUODENOSCOPY (EGD) WITH PROPOFOL;  Surgeon: Mauri Pole, MD;  [facility];  Service: Endoscopy;  Laterality: N/A;   EUS N/A 02/27/2021   Procedure: UPPER ENDOSCOPIC ULTRASOUND (EUS) RADIAL;  Surgeon: Milus Banister, MD;  [facility];  Service: Endoscopy;  Laterality: N/A;   FINE NEEDLE ASPIRATION N/A 02/27/2021   Procedure: FINE NEEDLE  ASPIRATION (FNA) LINEAR;  Surgeon: Milus Banister, MD;  [facility];  Service: Endoscopy;  Laterality: N/A;   IR IMAGING GUIDED PORT INSERTION  03/21/2021   SPHINCTEROTOMY  02/27/2021   Procedure: SPHINCTEROTOMY;  Surgeon: Milus Banister, MD;  [facility];  Service: Endoscopy;;   STENT REMOVAL  03/27/2021   Procedure: Lavell Islam REMOVAL;  Surgeon: Jackquline Denmark, MD;  [facility];  Service: Endoscopy;;    TONSILLECTOMY      Social History   Socioeconomic History   Marital status: Single    Spouse name: Not on file   Number of children: Not on file   Years of education: Not on file   Highest education level: Not on file  Occupational History   Not on file  Tobacco Use   Smoking status: Every Day    Packs/day: 0.50    Years: 35.00    Total pack years: 17.50    Types: Cigarettes   Smokeless tobacco: Never   Tobacco comments:    Currently being followed by the smoking cessation program   Vaping Use   Vaping Use: Never used  Substance and Sexual Activity   Alcohol use: Not Currently    Comment: previous   Drug use: No   Sexual activity: Not on file  Other Topics Concern   Not on file  Social History Narrative   Not on file   Social Determinants of Health   Financial Resource Strain: High Risk (03/13/2021)   Overall Financial Resource Strain (CARDIA)    Difficulty of Paying Living Expenses: Very hard  Food Insecurity: Food Insecurity Present (05/15/2021)   Hunger Vital Sign    Worried About Running Out of Food in the Last Year: Sometimes true    Ran Out of Food in the Last Year: Sometimes true  Transportation Needs: Unmet Transportation Needs (03/13/2021)   PRAPARE - Hydrologist (Medical): Yes    Lack of Transportation (Non-Medical): Yes  Physical Activity: Not on file  Stress: Not on file  Social Connections: Not on file  Intimate Partner Violence: Not At Risk (12/02/2021)   Humiliation, Afraid, Rape, and Kick questionnaire    Fear of Current or Ex-Partner: No    Emotionally Abused: No    Physically Abused: No    Sexually Abused: No   Family History  Problem Relation Age of Onset   Breast cancer Mother    Stroke Father    Alcohol abuse Father    Heart disease Maternal Grandfather    Pancreatic cancer Paternal Grandmother    Diabetes Paternal Grandfather    Colon cancer Neg Hx    Stomach cancer Neg Hx    Rectal cancer Neg Hx     Esophageal cancer Neg Hx     Current Outpatient Medications  Medication Sig Dispense Refill   Accu-Chek Softclix Lancets lancets Check blood glucose level by fingerstick 3-4 times per day. 100 each 3   acetaminophen (TYLENOL) 500 MG tablet Take 1 tablet by mouth every 6 hours as needed for moderate pain. 60 tablet 3   albuterol (VENTOLIN HFA) 108 (90 Base) MCG/ACT inhaler Inhale 2 puffs into the lungs every 6 hours as needed for wheezing or shortness of breath. 18 g 0   aspirin EC 81 MG tablet Take 1 tablet by mouth daily. 100 tablet 0   atorvastatin (LIPITOR) 20 MG tablet Take 1 tablet by mouth daily. 90 tablet 1   Continuous Blood Gluc Sensor (FREESTYLE LIBRE 2 SENSOR)  MISC Use 1 sensor every 2 weeks as advised 6 each 3   diphenoxylate-atropine (LOMOTIL) 2.5-0.025 MG tablet Take 1 tablet by mouth 4 times daily as needed for diarrhea or loose stools. 60 tablet 1   DULoxetine (CYMBALTA) 60 MG capsule Take 1 capsule (60 mg total) by mouth daily. 90 capsule 1   EPINEPHrine 0.3 mg/0.3 mL IJ SOAJ injection Inject 0.3 mg into the muscle once as needed for anaphylaxis. (Patient not taking: Reported on 06/01/2022) 2 each 1   escitalopram (LEXAPRO) 10 MG tablet Take 1 tablet (10 mg total) by mouth daily. 90 tablet 1   folic acid (FOLVITE) 1 MG tablet Take 1 tablet (1 mg total) by mouth in the morning.     glucose blood (ACCU-CHEK GUIDE) test strip Check blood glucose level by fingerstick 3 - 4 times per day. 100 each 12   insulin degludec (TRESIBA FLEXTOUCH) 100 UNIT/ML FlexTouch Pen Inject 24 Units into the skin daily. (Patient taking differently: Inject 7 Units into the skin daily.) 9 mL 3   Insulin Lispro-aabc (LYUMJEV KWIKPEN) 100 UNIT/ML KwikPen Inject 5-8 Units into the skin 3 (three) times daily before meals. (Patient taking differently: Inject 3 Units into the skin 3 (three) times daily before meals.) 15 mL 3   Insulin Pen Needle (TRUEPLUS PEN NEEDLES) 32G X 4 MM MISC Use as instructed. Inject into  the skin twice daily 200 each 6   levofloxacin (LEVAQUIN) 750 MG tablet Take 750 mg by mouth daily.     lidocaine-prilocaine (EMLA) cream Apply topically as directed when needed. (Patient not taking: Reported on 06/01/2022) 30 g 0   lipase/protease/amylase (CREON) 36000 UNITS CPEP capsule Take 1 capsule (36,000 Units total) by mouth 3 (three) times daily with meals. May take 1 additional capsule with snacks 270 capsule 3   methadone (DOLOPHINE) 5 MG tablet Take 1 tablet (5 mg total) by mouth every 12 (twelve) hours for 30 days 60 tablet 0   mirtazapine (REMERON) 30 MG tablet Take 1 tablet (30 mg total) by mouth at bedtime. 90 tablet 0   Multiple Vitamins-Minerals (THEREMS-M) TABS Take 1 tablet by mouth in the morning. 90 tablet 3   mupirocin ointment (BACTROBAN) 2 % Apply to wound surface with each dressing change. 22 g 0   nicotine (NICODERM CQ - DOSED IN MG/24 HOURS) 21 mg/24hr patch Apply one patch daily for 12 weeks. 84 patch 1   ondansetron (ZOFRAN) 8 MG tablet Take 1 tablet (8 mg total) by mouth 2 (two) times daily as needed. Start on day 3 after chemotherapy. 30 tablet 1   oxyCODONE (OXY IR/ROXICODONE) 5 MG immediate release tablet Take 1 to 2 tablets by mouth every 6 hours as needed for severe pain. 120 tablet 0   oxyCODONE ER (XTAMPZA ER) 9 MG C12A Take 1 capsule by mouth every 12 hours. 60 capsule 0   pantoprazole (PROTONIX) 40 MG tablet Take 1 tablet by mouth daily. 90 tablet 1   pregabalin (LYRICA) 75 MG capsule Take 75 mg by mouth daily.     prochlorperazine (COMPAZINE) 10 MG tablet Take 1 tablet (10 mg total) by mouth every 6 (six) hours as needed for nausea or vomiting. 30 tablet 1   VANCOMYCIN HCL IV Inject into the vein.     varenicline (APO-VARENICLINE) 1 MG tablet Take 1 tablet by mouth once daily in the morning with food 84 tablet 1   zolpidem (AMBIEN) 10 MG tablet Take 1 tablet (10 mg total) by mouth at bedtime  as needed for sleep. 30 tablet 1   No current facility-administered  medications for this visit.    Allergies  Allergen Reactions   Bee Venom Anaphylaxis   Lactose Intolerance (Gi) Other (See Comments)    GI upset     REVIEW OF SYSTEMS:  '[X]'$  denotes positive finding, '[ ]'$  denotes negative finding Cardiac  Comments:  Chest pain or chest pressure:    Shortness of breath upon exertion:    Short of breath when lying flat:    Irregular heart rhythm:        Vascular    Pain in calf, thigh, or hip brought on by ambulation:    Pain in feet at night that wakes you up from your sleep:     Blood clot in your veins:    Leg swelling:         Pulmonary    Oxygen at home:    Productive cough:     Wheezing:         Neurologic    Sudden weakness in arms or legs:     Sudden numbness in arms or legs:     Sudden onset of difficulty speaking or slurred speech:    Temporary loss of vision in one eye:     Problems with dizziness:         Gastrointestinal    Blood in stool:     Vomited blood:         Genitourinary    Burning when urinating:     Blood in urine:        Psychiatric    Major depression:         Hematologic    Bleeding problems:    Problems with blood clotting too easily:        Skin    Rashes or ulcers:        Constitutional    Fever or chills:      PHYSICAL EXAMINATION:  There were no vitals filed for this visit.  General:  WDWN in NAD; vital signs documented above Gait: Not observed HENT: WNL, normocephalic Pulmonary: normal non-labored breathing , without wheezing Cardiac: regular HR Abdomen: soft, NT, no masses Skin: without rashes Vascular Exam/Pulses:  Right Left                  DP 2+ (normal) 2+ (normal)  PT 2+ (normal) 2+ (normal)   Extremities: with ischemic changes, without Gangrene , without cellulitis; without open wounds;  Musculoskeletal: no muscle wasting or atrophy  Neurologic: A&O X 3;  No focal weakness or paresthesias are detected Psychiatric:  The pt has Normal affect.   Non-Invasive  Vascular Imaging:   Bilateral lower extremity ABIs demonstrate relatively normal perfusion in bilateral lower extremities.    ASSESSMENT/PLAN: [name] [name] is a 61 y.o. male presenting with nonhealing left TMA.  CMA was performed roughly 1 year ago, with recent debridement at [facility] for osteomyelitis.  He is currently being treated with IV antibiotics.  I had a long conversation with [name] [name] regarding his left TMA wound, as well as his life expectancy associated with pancreatic cancer.  [name] [name] he stated he would like to do everything in an effort to continue ambulating until he passes away.  I discussed that with a palpable pulse at the level of the ankle, it is likely small vessel disease from diabetes.  His ambulation without proper support, continued smoking, malnutrition from pancreatic cancer, and compression dressing are all hindering wound healing.  We discussed left lower extremity angiography in an effort to define and improve perfusion for left leg wound healing.  I will attempt endovascular invention if he has lesions amenable.  Should he have lesions needing operative repair, [name] [name] understands that surgery will not be offered due to his <12 month life expectancy from stage IV pancreatic cancer.[name] John, MD [facility] [phone]

## 2022-06-19 ENCOUNTER — Other Ambulatory Visit (HOSPITAL_COMMUNITY): Payer: Self-pay

## 2022-06-19 ENCOUNTER — Encounter: Payer: Self-pay | Admitting: Vascular Surgery

## 2022-06-19 ENCOUNTER — Ambulatory Visit (INDEPENDENT_AMBULATORY_CARE_PROVIDER_SITE_OTHER): Payer: Medicaid Other | Admitting: Vascular Surgery

## 2022-06-19 ENCOUNTER — Ambulatory Visit (HOSPITAL_COMMUNITY)
Admission: RE | Admit: 2022-06-19 | Discharge: 2022-06-19 | Disposition: A | Discharge status: Home / Self Care | Payer: Medicaid Other | Source: Ambulatory Visit | Attending: Vascular Surgery | Admitting: Vascular Surgery

## 2022-06-19 ENCOUNTER — Other Ambulatory Visit: Payer: Self-pay

## 2022-06-19 VITALS — BP 161/95 | HR 110 | Temp 98.0°F | Resp 20 | Ht 71.0 in | Wt 124.0 lb

## 2022-06-19 DIAGNOSIS — I70244 Atherosclerosis of native arteries of left leg with ulceration of heel and midfoot: Secondary | ICD-10-CM | POA: Diagnosis not present

## 2022-06-19 DIAGNOSIS — I739 Peripheral vascular disease, unspecified: Secondary | ICD-10-CM

## 2022-06-19 DIAGNOSIS — S91309A Unspecified open wound, unspecified foot, initial encounter: Secondary | ICD-10-CM | POA: Diagnosis not present

## 2022-06-22 ENCOUNTER — Telehealth: Payer: Self-pay

## 2022-06-22 NOTE — Telephone Encounter (Signed)
Spoke with patient and rescheduled Palliative Care consult to 10/30 @ 2:30 PM.

## 2022-06-24 ENCOUNTER — Encounter (HOSPITAL_COMMUNITY)
Admission: RE | Disposition: A | Discharge status: Home / Self Care | Payer: Self-pay | Source: Home / Self Care | Attending: Vascular Surgery

## 2022-06-24 ENCOUNTER — Ambulatory Visit (HOSPITAL_COMMUNITY)
Admission: RE | Admit: 2022-06-24 | Discharge: 2022-06-24 | Disposition: A | Discharge status: Home / Self Care | Payer: Medicaid Other | Attending: Vascular Surgery | Admitting: Vascular Surgery

## 2022-06-24 ENCOUNTER — Other Ambulatory Visit: Payer: Self-pay

## 2022-06-24 DIAGNOSIS — I70245 Atherosclerosis of native arteries of left leg with ulceration of other part of foot: Secondary | ICD-10-CM | POA: Diagnosis not present

## 2022-06-24 DIAGNOSIS — E1151 Type 2 diabetes mellitus with diabetic peripheral angiopathy without gangrene: Secondary | ICD-10-CM | POA: Diagnosis present

## 2022-06-24 DIAGNOSIS — C259 Malignant neoplasm of pancreas, unspecified: Secondary | ICD-10-CM | POA: Diagnosis not present

## 2022-06-24 DIAGNOSIS — C787 Secondary malignant neoplasm of liver and intrahepatic bile duct: Secondary | ICD-10-CM | POA: Diagnosis not present

## 2022-06-24 DIAGNOSIS — E11621 Type 2 diabetes mellitus with foot ulcer: Secondary | ICD-10-CM | POA: Insufficient documentation

## 2022-06-24 DIAGNOSIS — F17210 Nicotine dependence, cigarettes, uncomplicated: Secondary | ICD-10-CM | POA: Diagnosis not present

## 2022-06-24 DIAGNOSIS — I70244 Atherosclerosis of native arteries of left leg with ulceration of heel and midfoot: Secondary | ICD-10-CM

## 2022-06-24 DIAGNOSIS — Z7982 Long term (current) use of aspirin: Secondary | ICD-10-CM | POA: Diagnosis not present

## 2022-06-24 DIAGNOSIS — L97529 Non-pressure chronic ulcer of other part of left foot with unspecified severity: Secondary | ICD-10-CM | POA: Diagnosis not present

## 2022-06-24 HISTORY — PX: ABDOMINAL AORTOGRAM W/LOWER EXTREMITY: CATH118223

## 2022-06-24 LAB — POCT I-STAT, CHEM 8
BUN: <3 mg/dL — ABNORMAL LOW (ref 6–20)
Calcium, Ion: 1.15 mmol/L (ref 1.15–1.40)
Chloride: 100 mmol/L (ref 98–111)
Creatinine, Ser: 0.5 mg/dL — ABNORMAL LOW (ref 0.61–1.24)
Glucose, Bld: 167 mg/dL — ABNORMAL HIGH (ref 70–99)
HCT: 30 % — ABNORMAL LOW (ref 39.0–52.0)
Hemoglobin: 10.2 g/dL — ABNORMAL LOW (ref 13.0–17.0)
Potassium: 4.2 mmol/L (ref 3.5–5.1)
Sodium: 139 mmol/L (ref 135–145)
TCO2: 26 mmol/L (ref 22–32)

## 2022-06-24 SURGERY — ABDOMINAL AORTOGRAM W/LOWER EXTREMITY
Anesthesia: LOCAL

## 2022-06-24 MED ORDER — SODIUM CHLORIDE 0.9 % IV SOLN: INTRAVENOUS | Status: DC

## 2022-06-24 MED ORDER — HYDRALAZINE HCL 20 MG/ML IJ SOLN: 5.0000 mg | INTRAMUSCULAR | Status: DC | PRN

## 2022-06-24 MED ORDER — SODIUM CHLORIDE 0.9% FLUSH: 3.0000 mL | INTRAVENOUS | Status: DC | PRN

## 2022-06-24 MED ORDER — HEPARIN (PORCINE) IN NACL 1000-0.9 UT/500ML-% IV SOLN
INTRAVENOUS | Status: AC
  Filled 2022-06-24: qty 1000

## 2022-06-24 MED ORDER — MIDAZOLAM HCL 2 MG/2ML IJ SOLN
INTRAMUSCULAR | Status: DC | PRN
  Administered 2022-06-24: 1 mg via INTRAVENOUS

## 2022-06-24 MED ORDER — LIDOCAINE HCL (PF) 1 % IJ SOLN
INTRAMUSCULAR | Status: AC
  Filled 2022-06-24: qty 30

## 2022-06-24 MED ORDER — LABETALOL HCL 5 MG/ML IV SOLN
10.0000 mg | INTRAVENOUS | Status: DC | PRN
  Administered 2022-06-24: 10 mg via INTRAVENOUS
  Filled 2022-06-24: qty 4

## 2022-06-24 MED ORDER — LIDOCAINE HCL (PF) 1 % IJ SOLN
INTRAMUSCULAR | Status: DC | PRN
  Administered 2022-06-24: 12 mL

## 2022-06-24 MED ORDER — SODIUM CHLORIDE 0.9% FLUSH: 3.0000 mL | Freq: Two times a day (BID) | INTRAVENOUS | Status: DC

## 2022-06-24 MED ORDER — IODIXANOL 320 MG/ML IV SOLN
INTRAVENOUS | Status: DC | PRN
  Administered 2022-06-24: 55 mL

## 2022-06-24 MED ORDER — HEPARIN SOD (PORK) LOCK FLUSH 100 UNIT/ML IV SOLN
500.0000 [IU] | INTRAVENOUS | Status: AC | PRN
  Administered 2022-06-24: 500 [IU]

## 2022-06-24 MED ORDER — SODIUM CHLORIDE 0.9 % IV SOLN: 250.0000 mL | INTRAVENOUS | Status: DC | PRN

## 2022-06-24 MED ORDER — ACETAMINOPHEN 325 MG PO TABS
650.0000 mg | ORAL_TABLET | ORAL | Status: DC | PRN
  Administered 2022-06-24: 650 mg via ORAL
  Filled 2022-06-24: qty 2

## 2022-06-24 MED ORDER — MIDAZOLAM HCL 2 MG/2ML IJ SOLN
INTRAMUSCULAR | Status: AC
  Filled 2022-06-24: qty 2

## 2022-06-24 MED ORDER — HEPARIN (PORCINE) IN NACL 1000-0.9 UT/500ML-% IV SOLN
INTRAVENOUS | Status: DC | PRN
  Administered 2022-06-24 (×2): 500 mL

## 2022-06-24 MED ORDER — FENTANYL CITRATE (PF) 100 MCG/2ML IJ SOLN
INTRAMUSCULAR | Status: DC | PRN
  Administered 2022-06-24: 50 ug via INTRAVENOUS

## 2022-06-24 MED ORDER — ONDANSETRON HCL 4 MG/2ML IJ SOLN: 4.0000 mg | Freq: Four times a day (QID) | INTRAMUSCULAR | Status: DC | PRN

## 2022-06-24 MED ORDER — FENTANYL CITRATE (PF) 100 MCG/2ML IJ SOLN
INTRAMUSCULAR | Status: AC
  Filled 2022-06-24: qty 2

## 2022-06-24 MED ORDER — SODIUM CHLORIDE 0.9 % WEIGHT BASED INFUSION
1.0000 mL/kg/h | INTRAVENOUS | Status: DC
  Administered 2022-06-24: 1 mL/kg/h via INTRAVENOUS

## 2022-06-24 SURGICAL SUPPLY — 11 items
CATH OMNI FLUSH 5F 65CM (CATHETERS) IMPLANT
DEVICE CLOSURE MYNXGRIP 5F (Vascular Products) IMPLANT
GLIDEWIRE ADV .035X260CM (WIRE) IMPLANT
KIT MICROPUNCTURE NIT STIFF (SHEATH) IMPLANT
KIT PV (KITS) ×1 IMPLANT
SHEATH PINNACLE 5F 10CM (SHEATH) IMPLANT
SHEATH PROBE COVER 6X72 (BAG) IMPLANT
SYR MEDRAD MARK V 150ML (SYRINGE) IMPLANT
TRANSDUCER W/STOPCOCK (MISCELLANEOUS) ×1 IMPLANT
TRAY PV CATH (CUSTOM PROCEDURE TRAY) ×1 IMPLANT
WIRE BENTSON .035X145CM (WIRE) IMPLANT

## 2022-06-24 NOTE — Op Note (Signed)
    Patient name: [name] [name] MRN: [id number] DOB: [birth year] Sex: male  06/24/2022 Pre-operative Diagnosis: Left lower extremity nonhealing wound Post-operative diagnosis:  Same Surgeon:  Broadus John, MD Procedure Performed: 1.  Ultrasound-guided micropuncture access of the right common femoral artery 2.  Aortogram 3.  Second-order cannulation, left lower extremity angiogram 4.  Moderate sedation time 23 minutes 5.  Contrast volume 55 mL 6.  Device assisted closure-Mynx    Indications:  [name] is a 60 y.o. male presenting with nonhealing left TMA.  TMA was performed roughly 1 year ago, with recent debridement at [facility] for osteomyelitis.  He is currently being treated with IV antibiotics.   I had a long conversation with [name] regarding his left TMA wound, as well as his life expectancy associated with pancreatic cancer.  [name] he stated he would like to do everything in an effort to continue ambulating until he passes away.  I discussed that with a palpable pulse at the level of the ankle, it is likely small vessel disease from diabetes.  His ambulation without proper support, continued smoking, malnutrition from pancreatic cancer, and compression dressing are all hindering wound healing.   We discussed left lower extremity angiography in an effort to define and improve perfusion for left leg wound healing.  I will attempt endovascular invention if he has lesions amenable.  Should he have lesions needing operative repair, [name] understands that surgery will not be offered due to his <12 month life expectancy from stage IV pancreatic cancer  Findings:  Aortogram: No flow-limiting stenosis in the aortoiliac segment bilaterally On the left: Widely patent common femoral artery, profunda, superficial femoral artery, popliteal artery.  Two-vessel runoff to the foot-anterior tibial artery, posterior tibial artery with intact pedal arch.  Atretic peroneal artery which occludes in  the mid tibia.    Procedure:  The patient was identified in the holding area and taken to room 8.  The patient was then placed supine on the table and prepped and draped in the usual sterile fashion.  A time out was called.  Ultrasound was used to evaluate the right common femoral artery.  It was patent .  A digital ultrasound image was acquired.  A micropuncture needle was used to access the right common femoral artery under ultrasound guidance.  An 018 wire was advanced without resistance and a micropuncture sheath was placed.  The 018 wire was removed and a benson wire was placed.  The micropuncture sheath was exchanged for a 5 french sheath.  An omniflush catheter was advanced over the wire to the level of L-1.  An abdominal angiogram was obtained.  Next, using the omniflush catheter and a benson wire, the aortic bifurcation was crossed and the catheter was placed into theleft external iliac artery and left runoff was obtained.   See results above.  No intervention performed.  Impression: Inflow widely patent with two-vessel runoff to the foot.  In the foot there is a filling of plantar arteries with an intact pedal arch.  Patient did not require intervention.   Cassandria Santee, MD [facility] [place] Office: [phone]

## 2022-06-24 NOTE — H&P (Signed)
Office Note   Patient seen and examined in preop holding.  No complaints. No changes to medication history or physical exam since last seen in clinic. After discussing the risks and benefits of left leg angiography, [name] [name] elected to proceed.  He is aware, he is only an endovascular candidate at this time due to his pancreatic cancer.  Furthermore, I would attempt balloon angioplasty over stenting due to his hypercoagulability from malignancy.  Patient stated he would like to remain ambulatory for as long as possible prior to passing away from his metastatic cancer.  [name] John MD   CC: Nonhealing left TMA Requesting Provider:  No ref. provider found  HPI: [name] [name] is a 61 y.o. ([birth year]) male presenting at the request of .[name], NP nonhealing left TMA  Exam today, [name] [name] was doing well.  A native of [place], he is currently unemployed, battling pancreatic cancer.  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  Life expectancy less than 1 year.  Regarding his left leg-[name] [name] underwent TMA for diabetic foot infection 1 year ago.  He has had difficulty with wound healing.  [name] [name] is being treated at the wound care center weekly, and is performing wound care on his own otherwise.  Care currently includes tight Coban wrap, with dressing underneath.  He is walking on the wound, with no support shoe, and continues to smoke.  The wound is being followed by ID, for which she is receiving IV antibiotics for osteomyelitis.  He underwent recent I&D and debridement at [facility] in September.  The pt is  on a statin for cholesterol management.  The pt is  on a daily aspirin.   Other AC:  - The pt is not on medication for hypertension.   The pt is  diabetic.  Most recent A1c under 7. Tobacco hx: Current smoker  Past Medical History:  Diagnosis Date   Barrett's esophagus dx 2016   Bronchitis    Chronic  hepatitis C without hepatic coma ([place]) 10/31/2014   Depression    Diabetes ([place])    ED (erectile dysfunction)    GERD (gastroesophageal reflux disease)    Hepatitis C    Hiatal hernia 10/2014   3cm   Neuropathy    pancreatic ca 12/2020   S/P transmetatarsal amputation of foot, left ([place]) 11/28/2020    Past Surgical History:  Procedure Laterality Date   AMPUTATION Left 11/15/2020   Procedure: LEFT TRANSMETATARSALS AMPUTATION;  Surgeon: Newt Minion, MD;  Location: [facility];  Service: Orthopedics;  Laterality: Left;   APPENDECTOMY     BILIARY STENT PLACEMENT N/A 02/27/2021   Procedure: BILIARY STENT PLACEMENT;  Surgeon: Milus Banister, MD;  Location: [facility];  Service: Endoscopy;  Laterality: N/A;   BILIARY STENT PLACEMENT N/A 03/27/2021   Procedure: BILIARY STENT PLACEMENT;  Surgeon: Jackquline Denmark, MD;  Location: [facility];  Service: Endoscopy;  Laterality: N/A;   BIOPSY  03/27/2021   Procedure: BIOPSY;  Surgeon: Jackquline Denmark, MD;  [facility];  Service: Endoscopy;;   COLONOSCOPY N/A 02/16/2014   Procedure: COLONOSCOPY;  Surgeon: Gatha Mayer, MD;  [facility];  Service: Endoscopy;  Laterality: N/A;   COLONOSCOPY     ENDOSCOPIC RETROGRADE CHOLANGIOPANCREATOGRAPHY (ERCP) WITH PROPOFOL N/A 02/27/2021   Procedure: ENDOSCOPIC RETROGRADE CHOLANGIOPANCREATOGRAPHY (ERCP) WITH PROPOFOL;  Surgeon: Milus Banister, MD;  [facility];  Service: Endoscopy;  Laterality:  N/A;   ERCP N/A 03/27/2021   Procedure: ENDOSCOPIC RETROGRADE CHOLANGIOPANCREATOGRAPHY (ERCP);  Surgeon: Jackquline Denmark, MD;  [facility];  Service: Endoscopy;  Laterality: N/A;   ESOPHAGOGASTRODUODENOSCOPY (EGD) WITH PROPOFOL N/A 05/06/2021   Procedure: ESOPHAGOGASTRODUODENOSCOPY (EGD) WITH PROPOFOL;  Surgeon: Mauri Pole, MD;  [facility];  Service: Endoscopy;  Laterality: N/A;   EUS N/A 02/27/2021   Procedure: UPPER ENDOSCOPIC ULTRASOUND (EUS) RADIAL;  Surgeon:  Milus Banister, MD;  [facility];  Service: Endoscopy;  Laterality: N/A;   FINE NEEDLE ASPIRATION N/A 02/27/2021   Procedure: FINE NEEDLE ASPIRATION (FNA) LINEAR;  Surgeon: Milus Banister, MD;  [facility];  Service: Endoscopy;  Laterality: N/A;   IR IMAGING GUIDED PORT INSERTION  03/21/2021   SPHINCTEROTOMY  02/27/2021   Procedure: SPHINCTEROTOMY;  Surgeon: Milus Banister, MD;  [facility];  Service: Endoscopy;;   STENT REMOVAL  03/27/2021   Procedure: Lavell Islam REMOVAL;  Surgeon: Jackquline Denmark, MD;  [facility];  Service: Endoscopy;;   TONSILLECTOMY      Social History   Socioeconomic History   Marital status: Single    Spouse name: Not on file   Number of children: Not on file   Years of education: Not on file   Highest education level: Not on file  Occupational History   Not on file  Tobacco Use   Smoking status: Every Day    Packs/day: 0.50    Years: 35.00    Total pack years: 17.50    Types: Cigarettes   Smokeless tobacco: Never   Tobacco comments:    Currently being followed by the smoking cessation program   Vaping Use   Vaping Use: Never used  Substance and Sexual Activity   Alcohol use: Not Currently    Comment: previous   Drug use: No   Sexual activity: Not on file  Other Topics Concern   Not on file  Social History Narrative   Not on file   Social Determinants of Health   Financial Resource Strain: High Risk (03/13/2021)   Overall Financial Resource Strain (CARDIA)    Difficulty of Paying Living Expenses: Very hard  Food Insecurity: Food Insecurity Present (05/15/2021)   Hunger Vital Sign    Worried About Running Out of Food in the Last Year: Sometimes true    Ran Out of Food in the Last Year: Sometimes true  Transportation Needs: Unmet Transportation Needs (03/13/2021)   PRAPARE - Hydrologist (Medical): Yes    Lack of Transportation (Non-Medical): Yes  Physical Activity: Not on file   Stress: Not on file  Social Connections: Not on file  Intimate Partner Violence: Not At Risk (12/02/2021)   Humiliation, Afraid, Rape, and Kick questionnaire    Fear of Current or Ex-Partner: No    Emotionally Abused: No    Physically Abused: No    Sexually Abused: No   Family History  Problem Relation Age of Onset   Breast cancer Mother    Stroke Father    Alcohol abuse Father    Heart disease Maternal Grandfather    Pancreatic cancer Paternal Grandmother    Diabetes Paternal Grandfather    Colon cancer Neg Hx    Stomach cancer Neg Hx    Rectal cancer Neg Hx    Esophageal cancer Neg Hx     Current Facility-Administered Medications  Medication Dose Route Frequency Provider Last Rate Last Admin   0.9 %  sodium chloride infusion  Intravenous Continuous [name] John, MD        Allergies  Allergen Reactions   Bee Venom Anaphylaxis   Lactose Intolerance (Gi) Other (See Comments)    GI upset     REVIEW OF SYSTEMS:  '[X]'$  denotes positive finding, '[ ]'$  denotes negative finding Cardiac  Comments:  Chest pain or chest pressure:    Shortness of breath upon exertion:    Short of breath when lying flat:    Irregular heart rhythm:        Vascular    Pain in calf, thigh, or hip brought on by ambulation:    Pain in feet at night that wakes you up from your sleep:     Blood clot in your veins:    Leg swelling:         Pulmonary    Oxygen at home:    Productive cough:     Wheezing:         Neurologic    Sudden weakness in arms or legs:     Sudden numbness in arms or legs:     Sudden onset of difficulty speaking or slurred speech:    Temporary loss of vision in one eye:     Problems with dizziness:         Gastrointestinal    Blood in stool:     Vomited blood:         Genitourinary    Burning when urinating:     Blood in urine:        Psychiatric    Major depression:         Hematologic    Bleeding problems:    Problems with blood clotting too easily:         Skin    Rashes or ulcers:        Constitutional    Fever or chills:      PHYSICAL EXAMINATION:  Vitals:   06/24/22 0651  BP: (!) 154/91  Pulse: 79  Resp: 17  Temp: 97.6 F (36.4 C)  TempSrc: Temporal  SpO2: 100%  Weight: 55.8 kg  Height: '5\' 11"'$  (1.803 m)    General:  WDWN in NAD; vital signs documented above Gait: Not observed HENT: WNL, normocephalic Pulmonary: normal non-labored breathing , without wheezing Cardiac: regular HR Abdomen: soft, NT, no masses Skin: without rashes Vascular Exam/Pulses:  Right Left                  DP 2+ (normal) 2+ (normal)  PT 2+ (normal) 2+ (normal)   Extremities: with ischemic changes, without Gangrene , without cellulitis; without open wounds;  Musculoskeletal: no muscle wasting or atrophy  Neurologic: A&O X 3;  No focal weakness or paresthesias are detected Psychiatric:  The pt has Normal affect.   Non-Invasive Vascular Imaging:   Bilateral lower extremity ABIs demonstrate relatively normal perfusion in bilateral lower extremities.    ASSESSMENT/PLAN: [name] [name] is a 61 y.o. male presenting with nonhealing left TMA.  CMA was performed roughly 1 year ago, with recent debridement at [facility] for osteomyelitis.  He is currently being treated with IV antibiotics.  I had a long conversation with [name] [name] regarding his left TMA wound, as well as his life expectancy associated with pancreatic cancer.  [name] [name] he stated he would like to do everything in an effort to continue ambulating until he passes away.  I discussed that with a palpable pulse at the level of the ankle, it is likely small  vessel disease from diabetes.  His ambulation without proper support, continued smoking, malnutrition from pancreatic cancer, and compression dressing are all hindering wound healing.  We discussed left lower extremity angiography in an effort to define and improve perfusion for left leg wound healing.  I will attempt endovascular invention if he  has lesions amenable.  Should he have lesions needing operative repair, [name] [name] understands that surgery will not be offered due to his <12 month life expectancy from stage IV pancreatic cancer.[name] John, MD [facility] [phone]

## 2022-06-24 NOTE — Progress Notes (Signed)
Post-ambulation done. No s/s of bleeding or hematoma noted. Will continue to monitor.

## 2022-06-25 ENCOUNTER — Ambulatory Visit: Payer: Medicaid Other | Admitting: Podiatry

## 2022-06-25 ENCOUNTER — Encounter (HOSPITAL_COMMUNITY): Payer: Self-pay | Admitting: Vascular Surgery

## 2022-06-25 ENCOUNTER — Ambulatory Visit: Payer: Medicaid Other | Admitting: Infectious Diseases

## 2022-06-29 ENCOUNTER — Other Ambulatory Visit: Payer: Self-pay | Admitting: Nurse Practitioner

## 2022-06-29 ENCOUNTER — Other Ambulatory Visit: Payer: Self-pay

## 2022-06-29 ENCOUNTER — Other Ambulatory Visit: Payer: Self-pay | Admitting: Family Medicine

## 2022-06-29 ENCOUNTER — Other Ambulatory Visit (HOSPITAL_COMMUNITY): Payer: Self-pay

## 2022-06-29 ENCOUNTER — Encounter: Payer: Self-pay | Admitting: Family Medicine

## 2022-06-29 ENCOUNTER — Telehealth: Payer: Medicaid Other | Admitting: Family Medicine

## 2022-06-29 ENCOUNTER — Other Ambulatory Visit: Payer: Self-pay | Admitting: Hematology and Oncology

## 2022-06-29 DIAGNOSIS — W19XXXA Unspecified fall, initial encounter: Secondary | ICD-10-CM | POA: Insufficient documentation

## 2022-06-29 DIAGNOSIS — Y92009 Unspecified place in unspecified non-institutional (private) residence as the place of occurrence of the external cause: Secondary | ICD-10-CM

## 2022-06-29 DIAGNOSIS — K8689 Other specified diseases of pancreas: Secondary | ICD-10-CM

## 2022-06-29 DIAGNOSIS — R197 Diarrhea, unspecified: Secondary | ICD-10-CM

## 2022-06-29 DIAGNOSIS — F5101 Primary insomnia: Secondary | ICD-10-CM

## 2022-06-29 DIAGNOSIS — C250 Malignant neoplasm of head of pancreas: Secondary | ICD-10-CM

## 2022-06-29 DIAGNOSIS — E860 Dehydration: Secondary | ICD-10-CM

## 2022-06-29 DIAGNOSIS — R195 Other fecal abnormalities: Secondary | ICD-10-CM

## 2022-06-29 DIAGNOSIS — M869 Osteomyelitis, unspecified: Secondary | ICD-10-CM

## 2022-06-29 MED ORDER — MUPIROCIN 2 % EX OINT
TOPICAL_OINTMENT | CUTANEOUS | 0 refills | Status: DC
  Filled 2022-06-29: qty 22, 15d supply, fill #0

## 2022-06-29 NOTE — Progress Notes (Signed)
Per Mechele Claude, NP, pt being seen by [facility], pt has had several episodes of diarrhea resulting in him being weak and falling. Pt scheduled to be seen in [facility] tomorrow for labs and fluids. Pt aware and agreeable to plan. Orders placed.

## 2022-06-29 NOTE — Progress Notes (Signed)
[place] Consult Note Telephone: [phone]  Fax: [phone]   Date of encounter: 06/29/22 2:43 PM PATIENT NAME: [name] [name] [address] [place] [address]   [phone] (home)  DOB: [birth year] MRN: [id number] PRIMARY CARE PROVIDER:    [name], NP,  [place] [address] [phone]  REFERRING PROVIDER:   [name], NP [place],   [address] [phone]  RESPONSIBLE PARTY:    Contact Information     Name Relation Home Work Mobile   Naquan, Garman Sister [phone]  [phone]      I connected with  ARASH KARSTENS on 06/29/22 by a video enabled telemedicine application and verified that I am speaking with the correct person using two identifiers.   I discussed the limitations of evaluation and management by telemedicine. The patient expressed understanding and agreed to proceed.   Palliative Care was asked to follow this patient by consultation request of  [name], NP to address advance care planning and complex medical decision making. This is the initial visit.          ASSESSMENT, SYMPTOM MANAGEMENT AND PLAN / RECOMMENDATIONS:   Diarrhea/Dehydration Pt with pancreatic cancer and being treated with IV Vancomycin for foot wound. Question possible C diff or if related to GI cancer  Arranged with cancer center for pt to come in and receive IV fluids tomorrow. Advised pt to use probiotics, like Florastor daily while on IV antibiotics and/or eat yogurt with probiotics like Activia. Will follow up with in person visit next week, schedule on Friday to see how pt recovers and if needs hospitalization   2.    Fall at home, initial encounter [facility] getting off toilet last night with blunt trauma to sternum and continued pain. As part of follow up with cancer center, arranging for evaluation and treatment of  dehydration and also for possible injury  3. Malignant neoplasm of head of pancreas Has received chemoradiation over the summer Follow up more complete assessment face to face next week.  4.  Osteomyelitis of left foot Currently scheduled to receive IV Vancomycin Q 12 hours IV through 07/01/22. Transitioning Care from [facility] to [facility] and ID at [facility].  Follow up Palliative Care Visit: Palliative care will continue to follow for complex medical decision making, advance care planning, and clarification of goals. Return 1 weeks or prn.    This visit was coded based on medical decision making (MDM).  PPS: 60%  HOSPICE ELIGIBILITY/DIAGNOSIS: TBD  Chief Complaint:  [facility] received a referral to follow up with patient for chronic disease management in setting of "terminal" pancreatic cancer. Pt c/o large volume watery stools worse in the last 24-36 hours and feeling weak.  HISTORY OF PRESENT ILLNESS:  [name] [name] is a 61 y.o. 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 He states he was referred "because I have terminal cancer and 6-12 months to live."  Does protein shakes, weight loss for last year. What he can't eat, he throws out. Is taking Vancomycin through Port-A-Cath for foot wound.  Goes for labs & flush at [facility] Thursday.  Having liquid BM a lot recently,  goes about 20 times per day for the last day and a half. Stools are large volume and watery. He c/o weakness and fell last night getting off toilet and hitting his sternum which remains painful today.  Records indicate that pt had critical limb ischemia in left foot where  he had a non-healing transmetatarsal amputation in 11/28/2020 due to diabetic foot infection.  He underwent a whipple procedure in  March of this year.  He is receiving IV antibiotics for osteomyelitis from ID. He had an I & D with debridement at [facility] in September. He continues to walk on the foot without a support shoe and smoke.  On 06/24/22 Vascular surgeon Dr Gwenlyn Saran performed outpatient abdominal aortogram to potentially try angioplasty or stenting, if vessel lesions amenable.  Stated he would not offer surgery with pt's prognosis of < 1 year.  Assessment terminated early to arrange IV hydration and assessment with cancer center given IV antibiotics and large volume watery diarrhea.   History obtained from review of EMR, discussion with primary team, and interview Mr. Gaspar.      Latest Ref Rng & Units 06/24/2022    6:51 AM 06/18/2022   11:30 AM 06/03/2022   11:52 AM  CBC  WBC 4.0 - 10.5 K/uL  10.4  11.9   Hemoglobin 13.0 - 17.0 g/dL 10.2  9.5  10.0   Hematocrit 39.0 - 52.0 % 30.0  27.7  28.9   Platelets 150 - 400 K/uL  123  179        Latest Ref Rng & Units 06/24/2022    6:51 AM 06/18/2022   11:30 AM 06/03/2022   11:52 AM  CMP  Glucose 70 - 99 mg/dL 167  354  349   BUN 6 - 20 mg/dL <3  <5  7   Creatinine 0.61 - 1.24 mg/dL 0.50  0.56  0.66   Sodium 135 - 145 mmol/L 139  135  131   Potassium 3.5 - 5.1 mmol/L 4.2  4.4  4.8   Chloride 98 - 111 mmol/L 100  104  101   CO2 22 - 32 mmol/L  25  27   Calcium 8.9 - 10.3 mg/dL  7.5  7.6   Total Protein 6.5 - 8.1 g/dL  6.3  6.5   Total Bilirubin 0.3 - 1.2 mg/dL  0.4  0.4   Alkaline Phos 38 - 126 U/L  288  248   AST 15 - 41 U/L  45  41   ALT 0 - 44 U/L  18  17     06/18/22 Albumin 2.4 Albumin 3.5 - 5.0 g/dL 2.4 Low   06/18/22 2.7 Low   06/03/22 2.9 Low  04/07/22  3.4 Low   3.3 Low   03/18/22 3.4 Low   02/23/22 3.2 Low     1 HM Topic           Component Ref Range & Units 2 mo ago (04/03/22) 5 mo ago (01/21/22) 9 mo ago (09/03/21) 11  mo ago (07/09/21) 1 yr ago (04/28/21) 1 yr ago (01/28/21) 1 yr ago (10/28/20)  Hemoglobin A1C 4.0 - 5.6 % 8.7 Abnormal   9.1 Abnormal   9.7 Abnormal   10.3 Abnormal   9.6 High  R, CM  10.3 High  R, CM       05/15/22 CT chest abd pelvis with contrast with MIPS (done at [facility]) Impression:  1. New hepatic hypodensities not seen on the 01/30/2022 CT are most compatible with  metastatic disease.  2. Stable postsurgical changes of Whipple procedure. No definite locally recurrent disease in the surgical bed.   05/16/22 MRI left foot with and without contrast: Impression: (done at [facility]) Status post TMA with medial plantar ulcer and sinus tract extending to the first metatarsal osseous surgical margin where there is dorsal periosteal abscess/phlegmon and osteomyelitis involving the residual first and second metatarsals.    05/19/22 Vas surg ABI: (done at [facility]) Impression:   Right ankle brachial index (ABI) of 1.33 is consistent with falsely elevated due to vessel calcification arterial occlusive disease at rest.  Continuous wave Doppler waveforms at the ankle of the posterior tibial artery were triphasic and triphasic at the dorsalis pedis artery. Digit pressure is consistent with healing.  Elevated pressures due to medial wall calcifications cannot be excluded by  continuous wave Doppler   Left ankle brachial index (ABI) of 1.40 is consistent with falsely elevated due to vessel calcification arterial occlusive disease at rest.  Continuous wave Doppler waveforms at the ankle of the posterior tibial artery were biphasic and biphasic at the dorsalis pedis artery. Digit pressure is consistent with unlikely healing.  Elevated pressures due to medial wall calcifications cannot be excluded by  continuous wave Doppler patient was sent back to his room.   06/24/22 Abdominal aortogram: Findings:  Aortogram: No flow-limiting stenosis in the aortoiliac segment bilaterally On the left: Widely patent common femoral  artery, profunda, superficial femoral artery, popliteal artery.  Two-vessel runoff to the foot-anterior tibial artery, posterior tibial artery with intact pedal arch.  Atretic peroneal artery which occludes in the mid tibia.  Impression: Inflow widely patent with two-vessel runoff to the foot.  In the foot there is a filling of plantar arteries with an intact pedal arch.  Patient did not require intervention.  01/13/22 CT abd/pelvis with contrast: MPRESSION: 1. No significant interval change from CT 01/05/2022. 2. Small collection along the subcapsular RIGHT hepatic lobe with drainage catheter in place. Decreasing gas within the collection. 3. Post Whipple type procedure without complication.  02/27/21 Pancreatic FNA: CYTOLOGY - NON GYN CYTOLOGY - NON PAP  CASE: [id number]  PATIENT: [name] [name]  Non-Gynecological Cytology Report      Clinical History: Pancreatic adenocarcinoma  Specimen Submitted:  A. PANCREAS, HEAD, FINE NEEDLE ASPIRATION:    FINAL MICROSCOPIC DIAGNOSIS:  - Malignant cells consistent with adenocarcinoma   SPECIMEN ADEQUACY:  Satisfactory for evaluation   IMMEDIATE EVALUATION:  MALIGNANT CELLS PRESENT (NDK)     I reviewed EMR for available labs, medications, imaging, studies and related documents.  Records reviewed and summarized above.   ROS General: Feeling weak ENMT: denies dysphagia Cardiovascular: endorses chest pain over sternum after fall against it last night, denies DOE Pulmonary: denies cough, denies increased SOB Abdomen: endorses poor appetite and unintended weight loss, endorses  frequent large volume, watery diarrhea. Denies blood in stool. MSK:  endorses increased weakness over lst 24-36 hours, single fall from toilet reported last night Psych: Endorses positive mood Heme/lymph/immuno: denies bruises, abnormal bleeding  Physical Exam: Current and past weights: current weight 123 lbs/BMI of 17.16 as of 06/24/22, weight of 138.16 lba   or 62.8 kg on 11/18/21  Constitutional: NAD General: frail appearing, thin CV: Able to speak in complete sentences without having to stop to breathe Pulmonary: No audible wheezing or dyspnea Neuro:  no cognitive impairment Psych: non-anxious affect, A and O x 3   CURRENT PROBLEM LIST:  Patient Active Problem List   Diagnosis Date Noted   Medication  management 06/01/2022   Smoking 06/01/2022   Protein-calorie malnutrition, severe 01/07/2022   Acute respiratory failure with hypoxia ([place]) 01/05/2022   Type 2 diabetes mellitus with ketosis ([place]) 01/05/2022   Pericardial effusion 01/05/2022   Pressure injury of skin 01/05/2022   Abdominal fluid collection 12/19/2021   Encounter for dental examination 09/12/2021   Teeth missing 09/12/2021   Caries 09/12/2021   Defective dental restoration 09/12/2021   Retained tooth root 09/12/2021   Chronic periodontitis 09/12/2021   Accretions on teeth 09/12/2021   Oral mucositis 09/12/2021   Ill-fitting dentures 09/12/2021   Wound of foot 09/10/2021   Closed displaced fracture of proximal phalanx of finger of left hand 06/12/2021   AKI (acute kidney injury) ([place]) 05/12/2021   Abdominal distension    Gastroparesis    Gastric outlet obstruction 05/05/2021   Abdominal pain 05/05/2021   Dehydration 05/05/2021   Nausea & vomiting 05/04/2021   Port-A-Cath in place 04/24/2021   Malnutrition of moderate degree 03/27/2021   Hyperglycemia due to diabetes mellitus ([place]) 03/26/2021   Pancreatic cancer ([place]) 03/12/2021   Malignant neoplasm of head of pancreas ([place]) 03/12/2021   Jaundice 02/26/2021   Loss of weight 02/26/2021   Hypertension associated with type 2 diabetes mellitus ([place]) 11/28/2020   Type 2 diabetes mellitus with diabetic neuropathy, unspecified ([place]) 11/28/2020   Hyperlipidemia associated with type 2 diabetes mellitus ([place]) 11/28/2020   GERD without esophagitis 11/28/2020   Major depression, recurrent, chronic ([place]) 11/28/2020    Aortic atherosclerosis ([place]) 11/28/2020   Diabetic neuropathy ([place]) 11/28/2020   Osteomyelitis ([place]) 11/14/2020   Sepsis ([place]) 10/28/2020   Community acquired pneumonia of right lower lobe of lung    Elevated LFTs    Hypophosphatemia    Acute metabolic encephalopathy    Acute maxillary sinusitis    Depression    Physical deconditioning    Pneumonia 05/14/2020   Hyponatremia 05/13/2020   Hypokalemia 05/13/2020   SIRS (systemic inflammatory response syndrome) ([place]) 05/13/2020   Hereditary hemochromatosis ([place]) 11/23/2017   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   Essential hypertension 04/02/2017   Hypogonadism male 08/13/2016   ETOH abuse 06/22/2016   Low serum testosterone level 01/08/2016   Type 2 diabetes mellitus with other specified complication ([place]) 97/67/3419   Erectile dysfunction 09/12/2015   Chronic hepatitis C without hepatic coma ([place]) 10/31/2014   Tobacco use disorder 08/03/2013   Diabetes type 2, controlled ([place]) 04/27/2013   PAST MEDICAL HISTORY:  Active Ambulatory Problems    Diagnosis Date Noted   Diabetes type 2, controlled ([place]) 04/27/2013   Tobacco use disorder 08/03/2013   Chronic hepatitis C without hepatic coma ([place]) 10/31/2014   Erectile dysfunction 09/12/2015   Type 2 diabetes mellitus with other specified complication ([place]) 37/90/2409   Low serum testosterone level 01/08/2016   ETOH abuse 06/22/2016   Hypogonadism male 08/13/2016   Essential hypertension 04/02/2017   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   Hereditary hemochromatosis ([place]) 11/23/2017   Hyponatremia 05/13/2020   Hypokalemia 05/13/2020   SIRS (systemic inflammatory response syndrome) ([place]) 05/13/2020   Pneumonia 05/14/2020   Community acquired pneumonia of right lower lobe of lung    Elevated LFTs    Hypophosphatemia    Acute metabolic encephalopathy    Acute maxillary sinusitis    Depression    Physical deconditioning    Sepsis ([place])  10/28/2020   Osteomyelitis ([place]) 11/14/2020   Hypertension associated with type 2 diabetes mellitus ([place]) 11/28/2020   Type 2 diabetes mellitus  with diabetic neuropathy, unspecified ([place]) 11/28/2020   Hyperlipidemia associated with type 2 diabetes mellitus ([place]) 11/28/2020   GERD without esophagitis 11/28/2020   Major depression, recurrent, chronic ([place]) 11/28/2020   Aortic atherosclerosis ([place]) 11/28/2020   Diabetic neuropathy ([place]) 11/28/2020   Jaundice 02/26/2021   Loss of weight 02/26/2021   Pancreatic cancer ([place]) 03/12/2021   Malignant neoplasm of head of pancreas ([place]) 03/12/2021   Hyperglycemia due to diabetes mellitus ([place]) 03/26/2021   Malnutrition of moderate degree 03/27/2021   Port-A-Cath in place 04/24/2021   Nausea & vomiting 05/04/2021   Gastric outlet obstruction 05/05/2021   Abdominal pain 05/05/2021   Dehydration 05/05/2021   Gastroparesis    Abdominal distension    AKI (acute kidney injury) ([place]) 05/12/2021   Closed displaced fracture of proximal phalanx of finger of left hand 06/12/2021   Wound of foot 09/10/2021   Encounter for dental examination 09/12/2021   Teeth missing 09/12/2021   Caries 09/12/2021   Defective dental restoration 09/12/2021   Retained tooth root 09/12/2021   Chronic periodontitis 09/12/2021   Accretions on teeth 09/12/2021   Oral mucositis 09/12/2021   Ill-fitting dentures 09/12/2021   Acute respiratory failure with hypoxia ([place]) 01/05/2022   Type 2 diabetes mellitus with ketosis ([place]) 01/05/2022   Abdominal fluid collection 12/19/2021   Pericardial effusion 01/05/2022   Pressure injury of skin 01/05/2022   Protein-calorie malnutrition, severe 01/07/2022   Medication management 06/01/2022   Smoking 06/01/2022   Resolved Ambulatory Problems    Diagnosis Date Noted   Abdominal pain, left lower quadrant 11/03/2013   Post-polypectomy bleeding 02/15/2014   Nausea without vomiting 11/28/2014   Hepatic cirrhosis ([place]) 01/10/2015    Skin lesion 01/02/2016   Elevated BP 01/02/2016   Elevated WBC count 02/18/2016   Alcohol abuse with alcohol-induced mood disorder ([place]) 12/17/2017   DTs (delirium tremens) ([place]) 05/13/2020   PNA (pneumonia) 05/13/2020   Sepsis with acute hypoxic respiratory failure without septic shock ([place])    Cellulitis of left foot 10/28/2020   Cellulitis of lower extremity 11/13/2020   Cellulitis 11/13/2020   Foot abscess, left 11/14/2020   S/P transmetatarsal amputation of foot, left ([place]) 11/28/2020   Past Medical History:  Diagnosis Date   Barrett's esophagus dx 2016   Bronchitis    Diabetes ([place])    ED (erectile dysfunction)    GERD (gastroesophageal reflux disease)    Hepatitis C    Hiatal hernia 10/2014   Neuropathy    pancreatic ca 12/2020   SOCIAL HX:  Social History   Tobacco Use   Smoking status: Every Day    Packs/day: 0.50    Years: 35.00    Total pack years: 17.50    Types: Cigarettes   Smokeless tobacco: Never   Tobacco comments:    Currently being followed by the smoking cessation program   Substance Use Topics   Alcohol use: Not Currently    Comment: previous   FAMILY HX:  Family History  Problem Relation Age of Onset   Breast cancer Mother    Stroke Father    Alcohol abuse Father    Heart disease Maternal Grandfather    Pancreatic cancer Paternal Grandmother    Diabetes Paternal Grandfather    Colon cancer Neg Hx    Stomach cancer Neg Hx    Rectal cancer Neg Hx    Esophageal cancer Neg Hx        Preferred Pharmacy: ALLERGIES:  Allergies  Allergen Reactions   Bee Venom Anaphylaxis  Lactose Intolerance (Gi) Other (See Comments)    GI upset     PERTINENT MEDICATIONS:  Outpatient Encounter Medications as of 06/29/2022  Medication Sig   Accu-Chek Softclix Lancets lancets Check blood glucose level by fingerstick 3-4 times per day.   acetaminophen (TYLENOL) 500 MG tablet Take 1 tablet by mouth every 6 hours as needed for moderate pain. (Patient  taking differently: Take 1,500 mg by mouth 2 (two) times daily as needed for moderate pain.)   albuterol (VENTOLIN HFA) 108 (90 Base) MCG/ACT inhaler Inhale 2 puffs into the lungs every 6 hours as needed for wheezing or shortness of breath.   aspirin EC 81 MG tablet Take 1 tablet by mouth daily.   atorvastatin (LIPITOR) 20 MG tablet Take 1 tablet by mouth daily.   Continuous Blood Gluc Sensor (FREESTYLE LIBRE 2 SENSOR) MISC Use 1 sensor every 2 weeks as advised   diphenoxylate-atropine (LOMOTIL) 2.5-0.025 MG tablet Take 1 tablet by mouth 4 times daily as needed for diarrhea or loose stools.   DULoxetine (CYMBALTA) 60 MG capsule Take 1 capsule (60 mg total) by mouth daily.   EPINEPHrine 0.3 mg/0.3 mL IJ SOAJ injection Inject 0.3 mg into the muscle once as needed for anaphylaxis.   escitalopram (LEXAPRO) 10 MG tablet Take 1 tablet (10 mg total) by mouth daily.   folic acid (FOLVITE) 1 MG tablet Take 1 tablet (1 mg total) by mouth in the morning.   glucose blood (ACCU-CHEK GUIDE) test strip Check blood glucose level by fingerstick 3 - 4 times per day.   insulin degludec (TRESIBA FLEXTOUCH) 100 UNIT/ML FlexTouch Pen Inject 24 Units into the skin daily. (Patient taking differently: Inject 7 Units into the skin daily.)   Insulin Lispro-aabc (LYUMJEV KWIKPEN) 100 UNIT/ML KwikPen Inject 5-8 Units into the skin 3 (three) times daily before meals. (Patient taking differently: Inject 3-5 Units into the skin 3 (three) times daily before meals.)   Insulin Pen Needle (TRUEPLUS PEN NEEDLES) 32G X 4 MM MISC Use as instructed. Inject into the skin twice daily   levofloxacin (LEVAQUIN) 750 MG tablet Take 750 mg by mouth daily.   lidocaine-prilocaine (EMLA) cream Apply topically as directed when needed.   lipase/protease/amylase (CREON) 36000 UNITS CPEP capsule Take 1 capsule (36,000 Units total) by mouth 3 (three) times daily with meals. May take 1 additional capsule with snacks (Patient taking differently: Take  72,000 Units by mouth 3 (three) times daily with meals.)   methadone (DOLOPHINE) 5 MG tablet Take 1 tablet (5 mg total) by mouth every 12 (twelve) hours for 30 days   mirtazapine (REMERON) 30 MG tablet Take 1 tablet (30 mg total) by mouth at bedtime.   Multiple Vitamins-Minerals (THEREMS-M) TABS Take 1 tablet by mouth in the morning.   mupirocin ointment (BACTROBAN) 2 % Apply to wound surface with each dressing change.   nicotine (NICODERM CQ - DOSED IN MG/24 HOURS) 21 mg/24hr patch Apply one patch daily for 12 weeks.   ondansetron (ZOFRAN) 8 MG tablet Take 1 tablet (8 mg total) by mouth 2 (two) times daily as needed. Start on day 3 after chemotherapy.   oxyCODONE (OXY IR/ROXICODONE) 5 MG immediate release tablet Take 1 to 2 tablets by mouth every 6 hours as needed for severe pain.   oxyCODONE ER (XTAMPZA ER) 9 MG C12A Take 1 capsule by mouth every 12 hours.   pantoprazole (PROTONIX) 40 MG tablet Take 1 tablet by mouth daily.   pregabalin (LYRICA) 75 MG capsule Take 75 mg by  mouth daily.   prochlorperazine (COMPAZINE) 10 MG tablet Take 1 tablet (10 mg total) by mouth every 6 (six) hours as needed for nausea or vomiting.   VANCOMYCIN HCL IV Inject 1 Dose into the vein daily.   varenicline (APO-VARENICLINE) 1 MG tablet Take 1 tablet by mouth once daily in the morning with food   zolpidem (AMBIEN) 10 MG tablet Take 1 tablet (10 mg total) by mouth at bedtime as needed for sleep.   No facility-administered encounter medications on file as of 06/29/2022.     -------------------------------------------------------------------- Advance Care Planning/Goals of Care: Goals include to maximize quality of life and symptom management.  Decision not to resuscitate or to de-escalate disease focused treatments due to poor prognosis. CODE STATUS: DNR as of 05/14/21    Thank you for the opportunity to participate in the care of Mr. Errickson.  The palliative care team will continue to follow. Please call our  office at [phone] if we can be of additional assistance.   Marijo Conception, FNP-C  COVID-19 PATIENT SCREENING TOOL Asked and negative response unless otherwise noted:  Have you had symptoms of covid, tested positive or been in contact with someone with symptoms/positive test in the past 5-10 days?

## 2022-06-30 ENCOUNTER — Inpatient Hospital Stay: Payer: Medicaid Other

## 2022-06-30 ENCOUNTER — Emergency Department (HOSPITAL_COMMUNITY): Payer: Medicaid Other

## 2022-06-30 ENCOUNTER — Ambulatory Visit (HOSPITAL_COMMUNITY)
Admission: RE | Admit: 2022-06-30 | Discharge: 2022-06-30 | Disposition: A | Discharge status: Home / Self Care | Payer: Medicaid Other | Source: Ambulatory Visit | Attending: Nurse Practitioner | Admitting: Nurse Practitioner

## 2022-06-30 ENCOUNTER — Other Ambulatory Visit: Payer: Self-pay

## 2022-06-30 ENCOUNTER — Encounter: Payer: Self-pay | Admitting: Hematology and Oncology

## 2022-06-30 ENCOUNTER — Other Ambulatory Visit (HOSPITAL_COMMUNITY): Payer: Self-pay

## 2022-06-30 ENCOUNTER — Other Ambulatory Visit: Payer: Self-pay | Admitting: Podiatry

## 2022-06-30 ENCOUNTER — Emergency Department (HOSPITAL_COMMUNITY)
Admission: EM | Admit: 2022-06-30 | Discharge: 2022-06-30 | Disposition: A | Discharge status: Home / Self Care | Payer: Medicaid Other | Attending: Emergency Medicine | Admitting: Emergency Medicine

## 2022-06-30 ENCOUNTER — Encounter (HOSPITAL_COMMUNITY): Payer: Self-pay | Admitting: Emergency Medicine

## 2022-06-30 ENCOUNTER — Inpatient Hospital Stay (HOSPITAL_BASED_OUTPATIENT_CLINIC_OR_DEPARTMENT_OTHER): Payer: Medicaid Other | Admitting: Physician Assistant

## 2022-06-30 VITALS — BP 147/85 | HR 94 | Temp 97.8°F | Resp 17

## 2022-06-30 VITALS — BP 147/85 | HR 94 | Temp 97.8°F | Resp 18 | Wt 121.2 lb

## 2022-06-30 DIAGNOSIS — R1010 Upper abdominal pain, unspecified: Secondary | ICD-10-CM | POA: Insufficient documentation

## 2022-06-30 DIAGNOSIS — C250 Malignant neoplasm of head of pancreas: Secondary | ICD-10-CM

## 2022-06-30 DIAGNOSIS — Z8507 Personal history of malignant neoplasm of pancreas: Secondary | ICD-10-CM | POA: Diagnosis not present

## 2022-06-30 DIAGNOSIS — W19XXXA Unspecified fall, initial encounter: Secondary | ICD-10-CM

## 2022-06-30 DIAGNOSIS — R0789 Other chest pain: Secondary | ICD-10-CM | POA: Diagnosis present

## 2022-06-30 DIAGNOSIS — R197 Diarrhea, unspecified: Secondary | ICD-10-CM

## 2022-06-30 DIAGNOSIS — M86072 Acute hematogenous osteomyelitis, left ankle and foot: Secondary | ICD-10-CM

## 2022-06-30 DIAGNOSIS — F418 Other specified anxiety disorders: Secondary | ICD-10-CM | POA: Insufficient documentation

## 2022-06-30 DIAGNOSIS — Z7982 Long term (current) use of aspirin: Secondary | ICD-10-CM | POA: Diagnosis not present

## 2022-06-30 DIAGNOSIS — S299XXA Unspecified injury of thorax, initial encounter: Secondary | ICD-10-CM | POA: Diagnosis present

## 2022-06-30 DIAGNOSIS — W1811XA Fall from or off toilet without subsequent striking against object, initial encounter: Secondary | ICD-10-CM | POA: Insufficient documentation

## 2022-06-30 DIAGNOSIS — K8689 Other specified diseases of pancreas: Secondary | ICD-10-CM

## 2022-06-30 DIAGNOSIS — Z794 Long term (current) use of insulin: Secondary | ICD-10-CM | POA: Diagnosis not present

## 2022-06-30 DIAGNOSIS — S2220XA Unspecified fracture of sternum, initial encounter for closed fracture: Secondary | ICD-10-CM

## 2022-06-30 DIAGNOSIS — S2222XA Fracture of body of sternum, initial encounter for closed fracture: Secondary | ICD-10-CM | POA: Insufficient documentation

## 2022-06-30 DIAGNOSIS — R69 Illness, unspecified: Secondary | ICD-10-CM

## 2022-06-30 LAB — CBC WITH DIFFERENTIAL (CANCER CENTER ONLY)
Abs Immature Granulocytes: 0.03 10*3/uL (ref 0.00–0.07)
Basophils Absolute: 0.1 10*3/uL (ref 0.0–0.1)
Basophils Relative: 1 %
Eosinophils Absolute: 0 10*3/uL (ref 0.0–0.5)
Eosinophils Relative: 0 %
HCT: 30.5 % — ABNORMAL LOW (ref 39.0–52.0)
Hemoglobin: 10.4 g/dL — ABNORMAL LOW (ref 13.0–17.0)
Immature Granulocytes: 0 %
Lymphocytes Relative: 5 %
Lymphs Abs: 0.4 10*3/uL — ABNORMAL LOW (ref 0.7–4.0)
MCH: 32.6 pg (ref 26.0–34.0)
MCHC: 34.1 g/dL (ref 30.0–36.0)
MCV: 95.6 fL (ref 80.0–100.0)
Monocytes Absolute: 1.1 10*3/uL — ABNORMAL HIGH (ref 0.1–1.0)
Monocytes Relative: 14 %
Neutro Abs: 6.3 10*3/uL (ref 1.7–7.7)
Neutrophils Relative %: 80 %
Platelet Count: 177 10*3/uL (ref 150–400)
RBC: 3.19 MIL/uL — ABNORMAL LOW (ref 4.22–5.81)
RDW: 13.5 % (ref 11.5–15.5)
WBC Count: 7.9 10*3/uL (ref 4.0–10.5)
nRBC: 0 % (ref 0.0–0.2)

## 2022-06-30 LAB — CMP (CANCER CENTER ONLY)
ALT: 19 U/L (ref 0–44)
AST: 56 U/L — ABNORMAL HIGH (ref 15–41)
Albumin: 2.5 g/dL — ABNORMAL LOW (ref 3.5–5.0)
Alkaline Phosphatase: 282 U/L — ABNORMAL HIGH (ref 38–126)
Anion gap: 3 — ABNORMAL LOW (ref 5–15)
BUN: <5 mg/dL — ABNORMAL LOW (ref 6–20)
CO2: 30 mmol/L (ref 22–32)
Calcium: 7.7 mg/dL — ABNORMAL LOW (ref 8.9–10.3)
Chloride: 106 mmol/L (ref 98–111)
Creatinine: 0.54 mg/dL — ABNORMAL LOW (ref 0.61–1.24)
GFR, Estimated: >60 mL/min (ref >60)
Glucose, Bld: 88 mg/dL (ref 70–99)
Potassium: 4.1 mmol/L (ref 3.5–5.1)
Sodium: 139 mmol/L (ref 135–145)
Total Bilirubin: 0.6 mg/dL (ref 0.3–1.2)
Total Protein: 6.9 g/dL (ref 6.5–8.1)

## 2022-06-30 MED ORDER — SODIUM CHLORIDE 0.9 % IV SOLN: Freq: Once | INTRAVENOUS | Status: AC

## 2022-06-30 MED ORDER — HEPARIN SOD (PORK) LOCK FLUSH 100 UNIT/ML IV SOLN
500.0000 [IU] | Freq: Once | INTRAVENOUS | Status: AC
  Administered 2022-06-30: 500 [IU] via INTRAVENOUS
  Filled 2022-06-30: qty 5

## 2022-06-30 MED ORDER — HYDROMORPHONE HCL 1 MG/ML IJ SOLN
1.0000 mg | Freq: Once | INTRAMUSCULAR | Status: AC
  Administered 2022-06-30: 1 mg via INTRAVENOUS
  Filled 2022-06-30: qty 1

## 2022-06-30 MED ORDER — DIPHENOXYLATE-ATROPINE 2.5-0.025 MG PO TABS
1.0000 | ORAL_TABLET | Freq: Four times a day (QID) | ORAL | 1 refills | Status: AC | PRN
  Filled 2022-06-30: qty 60, 15d supply, fill #0
  Filled 2022-07-20: qty 60, 15d supply, fill #1

## 2022-06-30 MED ORDER — ASPIRIN 81 MG PO TBEC
81.0000 mg | DELAYED_RELEASE_TABLET | Freq: Every day | ORAL | 0 refills | Status: AC
  Filled 2022-06-30: qty 100, 100d supply, fill #0

## 2022-06-30 MED ORDER — OXYCODONE HCL 5 MG PO TABS
5.0000 mg | ORAL_TABLET | Freq: Four times a day (QID) | ORAL | 0 refills | Status: DC | PRN
  Filled 2022-06-30: qty 120, 15d supply, fill #0

## 2022-06-30 NOTE — Telephone Encounter (Signed)
Requested Prescriptions  Pending Prescriptions Disp Refills  . aspirin EC 81 MG tablet 100 tablet 0    Sig: Take 1 tablet by mouth daily.     Analgesics:  NSAIDS - aspirin Failed - 06/29/2022  9:03 AM      Failed - Cr in normal range and within 360 days    Creatinine  Date Value Ref Range Status  06/18/2022 0.56 (L) 0.61 - 1.24 mg/dL Final   Creat  Date Value Ref Range Status  12/27/2020 0.71 0.70 - 1.33 mg/dL Final    Comment:    For patients >70 years of age, the reference limit for Creatinine is approximately 13% higher for people identified as African-American. .    Creatinine, Ser  Date Value Ref Range Status  06/24/2022 0.50 (L) 0.61 - 1.24 mg/dL Final   Creatinine, Urine  Date Value Ref Range Status  05/17/2020 73.42 mg/dL Final    Comment:    Performed at [facility], [place] [address] [place], [place] [address]         Passed - eGFR is 10 or above and within 360 days    GFR, Est African American  Date Value Ref Range Status  12/27/2020 120 > OR = 60 mL/min/1.41m Final   GFR, Est Non African American  Date Value Ref Range Status  12/27/2020 103 > OR = 60 mL/min/1.748mFinal   GFR, Estimated  Date Value Ref Range Status  06/18/2022 >60 >60 mL/min Final    Comment:    (NOTE) Calculated using the CKD-EPI Creatinine Equation (2021)    GFR  Date Value Ref Range Status  02/26/2021 101.42 >60.00 mL/min Final    Comment:    Calculated using the CKD-EPI Creatinine Equation (2021)         Passed - Patient is not pregnant      Passed - Valid encounter within last 12 months    Recent Outpatient Visits          1 month ago Encounter for annual physical exam   [facility]   4 months ago Hospital discharge follow-up   [facility]   6 months ago Essential hypertension   [facility]   8  months ago Loose stools   [facility]   11 months ago GERD without esophagitis   [facility]      Future Appointments            In 3 days MaRosiland OzMD [facility]   In 1 month ChG[facility]ellsMaTerisa StarrMD [facility]   In 4 months FlGildardo PoundsNP [facility]

## 2022-06-30 NOTE — ED Provider Notes (Signed)
[place] DEPT Provider Note   CSN: [id number] Arrival date & time: 06/30/22  1435     History  Chief Complaint  Patient presents with   Sternum Fracture     [name] [name] is a 61 y.o. male.  HPI Patient has history of pancreatic cancer.  He is currently being treated with vancomycin for foot infection.  He fell onto his toilet yesterday evening.  He was leaning forwards over using his handrails and elevated toilet seat and it tipped causing him to fall chest forward onto the edge of the toilet seat.  Patient denies striking his head.  He did not have loss of consciousness.  He reports he only struck his front chest on the toilet seat very hard.  Continues to be very painful with movement or deep breath.  He denies feeling short of breath.  Takes daily aspirin but not other anticoagulants.  No additional trauma.  Patient was seen at the cancer center and x-rays were taken showing a displaced sternal fracture.  Patient is referred to emergency department for further evaluation of chest wall trauma.    Home Medications Prior to Admission medications   Medication Sig Start Date End Date Taking? Authorizing Provider  Accu-Chek Softclix Lancets lancets Check blood glucose level by fingerstick 3-4 times per day. 07/04/21   Charlott Rakes, MD  acetaminophen (TYLENOL) 500 MG tablet Take 1 tablet by mouth every 6 hours as needed for moderate pain. Patient taking differently: Take 1,500 mg by mouth 2 (two) times daily as needed for moderate pain. 03/23/22   [name], NP  albuterol (VENTOLIN HFA) 108 (90 Base) MCG/ACT inhaler Inhale 2 puffs into the lungs every 6 hours as needed for wheezing or shortness of breath. 05/10/22   Lincoln Brigham, PA-C  aspirin EC 81 MG tablet Take 1 tablet by mouth daily. 06/30/22   Charlott Rakes, MD  atorvastatin (LIPITOR) 20 MG tablet Take 1 tablet by mouth daily. 05/07/22   Charlott Rakes, MD  Continuous Blood Gluc Sensor  (FREESTYLE LIBRE 2 SENSOR) MISC Use 1 sensor every 2 weeks as advised 04/03/22   Philemon Kingdom, MD  diphenoxylate-atropine (LOMOTIL) 2.5-0.025 MG tablet Take 1 tablet by mouth 4 times daily as needed for diarrhea or loose stools. 04/15/22   [name], NP  DULoxetine (CYMBALTA) 60 MG capsule Take 1 capsule (60 mg total) by mouth daily. 02/17/22 05/18/22  Charlott Rakes, MD  EPINEPHrine 0.3 mg/0.3 mL IJ SOAJ injection Inject 0.3 mg into the muscle once as needed for anaphylaxis. 05/07/22   [name], NP  escitalopram (LEXAPRO) 10 MG tablet Take 1 tablet (10 mg total) by mouth daily. 05/06/22   [name], NP  folic acid (FOLVITE) 1 MG tablet Take 1 tablet (1 mg total) by mouth in the morning. 03/28/21   Rai, Ripudeep K, MD  glucose blood (ACCU-CHEK GUIDE) test strip Check blood glucose level by fingerstick 3 - 4 times per day. 07/04/21   Charlott Rakes, MD  insulin degludec (TRESIBA FLEXTOUCH) 100 UNIT/ML FlexTouch Pen Inject 24 Units into the skin daily. Patient taking differently: Inject 7 Units into the skin daily. 01/21/22   Philemon Kingdom, MD  Insulin Lispro-aabc (LYUMJEV KWIKPEN) 100 UNIT/ML KwikPen Inject 5-8 Units into the skin 3 (three) times daily before meals. Patient taking differently: Inject 3-5 Units into the skin 3 (three) times daily before meals. 01/21/22   Philemon Kingdom, MD  Insulin Pen Needle (TRUEPLUS PEN NEEDLES) 32G X 4  MM MISC Use as instructed. Inject into the skin twice daily 03/04/21   [name], NP  levofloxacin (LEVAQUIN) 750 MG tablet Take 750 mg by mouth daily.    [provider]  lidocaine-prilocaine (EMLA) cream Apply topically as directed when needed. 05/10/22   Lincoln Brigham, PA-C  lipase/protease/amylase (CREON) 36000 UNITS CPEP capsule Take 1 capsule (36,000 Units total) by mouth 3 (three) times daily with meals. May take 1 additional capsule with snacks Patient taking differently: Take 72,000 Units by mouth 3 (three) times daily  with meals. 03/30/22   [name], NP  methadone (DOLOPHINE) 5 MG tablet Take 1 tablet (5 mg total) by mouth every 12 (twelve) hours for 30 days 05/29/22     mirtazapine (REMERON) 30 MG tablet Take 1 tablet (30 mg total) by mouth at bedtime. 04/29/22 07/29/22  Charlott Rakes, MD  Multiple Vitamins-Minerals (THEREMS-M) TABS Take 1 tablet by mouth in the morning. 07/04/21   [name], NP  mupirocin ointment (BACTROBAN) 2 % Apply to wound surface with each dressing change. 02/04/22     nicotine (NICODERM CQ - DOSED IN MG/24 HOURS) 21 mg/24hr patch Apply one patch daily for 12 weeks. 04/14/22     ondansetron (ZOFRAN) 8 MG tablet Take 1 tablet (8 mg total) by mouth 2 (two) times daily as needed. Start on day 3 after chemotherapy. 05/10/22   Lincoln Brigham, PA-C  oxyCODONE (OXY IR/ROXICODONE) 5 MG immediate release tablet Take 1 - 2 tablets (5 - 10 mg total) by mouth every 6 hours as needed for severe pain. 06/30/22   Orson Slick, MD  oxyCODONE ER [facility] ER) 9 MG C12A Take 1 capsule by mouth every 12 hours. 05/28/22   Lincoln Brigham, PA-C  pantoprazole (PROTONIX) 40 MG tablet Take 1 tablet by mouth daily. 05/07/22   Charlott Rakes, MD  pregabalin (LYRICA) 75 MG capsule Take 75 mg by mouth daily. 05/22/22   [provider]  prochlorperazine (COMPAZINE) 10 MG tablet Take 1 tablet (10 mg total) by mouth every 6 (six) hours as needed for nausea or vomiting. 05/10/22   Lincoln Brigham, PA-C  VANCOMYCIN HCL IV Inject 1 Dose into the vein daily.    [provider]  varenicline (APO-VARENICLINE) 1 MG tablet Take 1 tablet by mouth once daily in the morning with food 03/24/22     zolpidem (AMBIEN) 10 MG tablet Take 1 tablet (10 mg total) by mouth at bedtime as needed for sleep. 05/27/22 07/31/22  [name], NP      Allergies    Bee venom and Lactose intolerance (gi)    Review of Systems   Review of Systems  Physical Exam Updated Vital Signs BP (!) 141/104   Pulse 92    Temp 98.1 F (36.7 C) (Oral)   Resp 14   SpO2 96%  Physical Exam Constitutional:      Comments: Patient is alert and nontoxic.  No respiratory distress.  Very thin.  Clear mental status.  HENT:     Head: Normocephalic and atraumatic.     Mouth/Throat:     Pharynx: Oropharynx is clear.  Eyes:     Extraocular Movements: Extraocular movements intact.  Cardiovascular:     Rate and Rhythm: Normal rate and regular rhythm.  Pulmonary:     Effort: Pulmonary effort is normal.     Breath sounds: Normal breath sounds.     Comments: Breath sounds are symmetric.  No wheeze rhonchi  rale.  Anterior chest wall very tender over the sternum and slightly to the left of the mid lower sternum.  No visible hematoma or skin injury. Abdominal:     General: There is no distension.     Palpations: Abdomen is soft.     Comments: Abdomen is soft and nondistended.  He endorses some diffuse tenderness of the upper abdomen.  He however qualifies that this has been a persistent and ongoing problem for some time now.  No distention, no contusions abrasions or hematomas to the abdominal wall.  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  Both lower extremities have approximately 1+ pitting edema.  Skin:    General: Skin is warm and dry.  Neurological:     General: No focal deficit present.     Mental Status: He is oriented to person, place, and time.  Psychiatric:        Mood and Affect: Mood normal.     ED Results / Procedures / Treatments   Labs (all labs ordered are listed, but only abnormal results are displayed) Labs Reviewed - No data to display  EKG None  Radiology CT Chest Wo Contrast  Result Date: 06/30/2022 CLINICAL DATA:  Blunt chest trauma. Fall yesterday. Anterior chest pain. Sternal fracture on radiograph. 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 EXAM: CT CHEST WITHOUT CONTRAST TECHNIQUE:  Multidetector CT imaging of the chest was performed following the standard protocol without IV contrast. RADIATION DOSE REDUCTION: This exam was performed according to the departmental dose-optimization program which includes automated exposure control, adjustment of the mA and/or kV according to patient size and/or use of iterative reconstruction technique. COMPARISON:  Radiograph earlier today.  Chest CT 01/05/2022 FINDINGS: Cardiovascular: Right chest port tip in the lower SVC. Moderate atherosclerosis of the thoracic aorta. No periaortic stranding. Aortic tortuosity. The heart is normal in size. There are coronary artery calcifications. Trace pericardial effusion, improved from prior CT. Mediastinum/Nodes: Trace mediastinal stranding related to sternal fracture but no mediastinal hematoma. No pneumomediastinum. Stable small mediastinal lymph nodes from prior exam. No suspicious adenopathy. Small hiatal hernia, otherwise unremarkable esophagus. Lungs/Pleura: No pneumothorax. No pulmonary contusion. Mild emphysema with biapical pleuroparenchymal scarring. Linear bandlike opacities in the lingula, left lower and left upper lobes. Adjacent to left lower lobe atelectasis is a 5 mm left lower lobe nodule series 5, image 135. No pleural fluid. Upper Abdomen: No acute upper abdominal findings on this unenhanced exam. Musculoskeletal: Displaced sternal body fracture with 1/2 shaft width posterior displacement of upper sternal fracture fragment. No evidence of underlying sternal lesion. No acute rib fractures. Thoracic spondylosis. 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 IMPRESSION: 1. Displaced sternal body fracture. Minimal retrosternal stranding but no confluent hematoma. 2. No additional acute traumatic injury to the chest. 3. Linear bandlike opacities in the lingula, left lower and left upper lobes, likely atelectasis. Adjacent to left lower lobe atelectasis is a 5 mm left lower lobe  nodule. Given malignancy history, follow-up per oncologic protocol. 4. Aortic atherosclerosis.  Coronary artery calcifications. 5. Emphysema. Emphysema (ICD10-J43.9).  Aortic Atherosclerosis (ICD10-I70.0). Electronically Signed   By: Keith Rake M.D.   On: 06/30/2022 16:27   DG Chest 2 View  Result Date: 06/30/2022 CLINICAL DATA:  Trauma, fall, chest pain EXAM: CHEST - 2 VIEW COMPARISON:  01/05/2022 FINDINGS: Cardiac size is within normal limits. There are no signs of pulmonary edema or focal pulmonary consolidation. There  is no pleural effusion or pneumothorax. Tip of right IJ chest port is seen in superior vena cava. Deformities are seen in the lateral aspect of left eighth and ninth ribs. There is fracture in the body of sternum with 10 mm offset in alignment. Deformity in the proximal right humerus may be residual from previous injury. IMPRESSION: There is displaced fracture in the body of sternum. Deformities are seen in the lateral aspect of left eighth and ninth ribs suggesting recent or old fractures. There is no focal pulmonary consolidation. There is no pleural effusion or pneumothorax. These results will be called to the ordering clinician or representative by the Radiologist Assistant, and communication documented in the PACS or [employer]. Electronically Signed   By: Elmer Picker M.D.   On: 06/30/2022 12:24   DG Foot Complete Left  Result Date: 06/30/2022 Please see detailed radiograph report in office note.   Procedures Procedures    Medications Ordered in ED Medications  HYDROmorphone (DILAUDID) injection 1 mg (has no administration in time range)  HYDROmorphone (DILAUDID) injection 1 mg (1 mg Intravenous Given 06/30/22 1538)    ED Course/ Medical Decision Making/ A&P                           Medical Decision Making Amount and/or Complexity of Data Reviewed Radiology: ordered.  Risk Prescription drug management.   Patient is referred from the cancer  center for chest wall injury identified at cancer center visit.  Plain films illustrate sternal fracture.  Patient does not have any respiratory distress.  His breath sounds are symmetric.  No pneumothorax was visualized on the plain film x-rays.  Does have a lot of pain with deep breath or movement.  We will proceed with CT chest to further define extent of chest wall injury and if any significant underlying entry not identified by plain film x-ray.  We will give the patient 1 mg Dilaudid for additional pain control.   CT scan visually reviewed by myself and radiology interpretation reviewed for a sternal fracture without any hematoma or pulmonary contusion.  There are chronic findings noted.  Also noted is a very small lung nodule recommended for surveillance given patient's history of pancreatic cancer.  I have reviewed these findings with the patient and described the need for continued surveillance of this small pulmonary nodule.  Patient voices understanding.  Patient is stable.  His vital signs are stable.  He is not having any difficulty breathing.  He is oxygenating well.  CT scan is ruled out pulmonary contusion or other immediate complications from a linear sternal fracture.  Patient is stable for discharge.  He has good follow-up plan in place.  He does have oxycodone IR at home to use for pain control.  We reviewed using his incentive spirometer that he already has.  Patient is aware of the need to continue respirations to avoid secondary pneumonia.       Final Clinical Impression(s) / ED Diagnoses Final diagnoses:  Fracture of body of sternum, initial encounter for closed fracture  Severe comorbid illness    Rx / DC Orders ED Discharge Orders     None         Charlesetta Shanks, MD 06/30/22 1705

## 2022-06-30 NOTE — Discharge Instructions (Signed)
1.  Follow-up with your oncologist for continued monitoring and surveillance.  Your CT scan shows a small nodule in the lung.  It is concerning because you have had cancer before.  Otherwise your CT scan showed chronic findings and the sternum fracture without any blood clot or bruising of the lungs. 2.  Use your oxycodone as needed for pain control at home.  Use your incentive spirometer. 3.  Return to the emergency department if you get increased difficulty breathing, sudden worsening pain or other concerning changes.

## 2022-06-30 NOTE — ED Triage Notes (Signed)
Pt comes from [facility] c/o fall yesterday Pt has had xray done which shown sternum fracture. Pt receiving vanc for foot infection. Hx pancreatic cancer, no current treatments.  VSS, no acute distress.

## 2022-06-30 NOTE — Progress Notes (Signed)
Symptom Management Consult note [place]    Patient Care Team: [name], NP as PCP - General (Nurse Practitioner) Werner Lean, MD as PCP - Cardiology (Cardiology) Armbruster, Carlota Raspberry, MD as Consulting Physician (Gastroenterology) Orson Slick, MD as Consulting Physician (Oncology) Royston Bake, RN as Registered Nurse    Name of the patient: [name] [name]  [id number]  Jul 28, 1961   Date of visit: 06/30/2022   Chief Complaint/Reason for visit: fall, diarrhea   Current Therapy: Completed chemoradiation with Xeloda on 04/09/2022.  Treatment currently delayed because of foot infection.     ASSESSMENT & PLAN: Patient is a 61 y.o. male  with oncologic history of metastatic pancreatic cancer followed by Dr. Lorenso Courier.  I have viewed most recent oncology note and lab work.    #) fall -CXR with sternum displaced fx 10 mm as well last fractures of ribs 8 and 9.  Radiologist also commented on proximal right humerus deformity which is from an old injury per patient in 2020 after a skiing accident.  I viewed x-ray and agree with radiologist impression. -In clinic patient is stable in terms of vital signs.  He has normal work of breathing and clear lung exam. -Patient needs further imaging for this injury to rule out internal bleeding or more extensive trauma. -Labs here show stable hemoglobin when compared to previous labs. -Patient transported to ED by myself and RN.  #) Diarrhea -Patient with 2 days of diarrhea.  He clinically looks dehydrated and was given a liter of IV fluids here.  Patient did not have bowel movement here so we were unable to collect C. difficile or GI PCR stool testing. -Patient has 1 more dose of vancomycin tomorrow which she has been getting for his foot wound with home health.  Also was receiving oral Bactrim -CMP today without significant electrolyte derangement, does have hypocalcemia 7.7 which is similar to recent labs.   Normal potassium and sodium.  #) Metastatic pancreatic cancer -Treatment is currently on hold as he recovers from significant wound infection.   Heme/Onc History: Oncology History  Pancreatic cancer ([place])  03/12/2021 Initial Diagnosis   Pancreatic cancer ([place])   03/12/2021 Cancer Staging   Staging form: Exocrine Pancreas, AJCC 8th Edition - Clinical stage from 03/12/2021: Stage IIB (cT3, cN1, cM0) - Signed by Orson Slick, MD on 03/12/2021 Stage prefix: Initial diagnosis   Malignant neoplasm of head of pancreas ([place])  03/12/2021 Initial Diagnosis   Malignant neoplasm of head of pancreas ([place])   04/10/2021 - 09/26/2021 Chemotherapy   Patient is on Treatment Plan : PANCREAS Modified FOLFIRINOX q14d x 4 cycles         Interval history-: [name] [name] is a 61 y.o. male with oncologic history as above presenting to [facility] today with chief complaint of fall and diarrhea.  Patient had a mechanical fall last night.  He states he was trying to get onto the commode when he lost his balance causing the commode to flip up and hit him in the chest.  He did not fall to the ground.  He did not hit his head or have it a loss of consciousness.  Since that injury he has had constant pain located in the middle of his chest that he describes as a soreness.  He rates the pain currently 6 out of 10 in severity.  He did take an oxycodone this morning prior to arrival.  His pain is worse  when coughing and is also endorsing a feeling of shortness of breath.  Patient is not anticoagulated. He states he has had diarrhea for last 2 days.  He reports yesterday having over 20 episodes of diarrhea which he describes as a brown liquid stool.  He has associated abdominal cramping worse in his left lower quadrant.  He denies seeing any blood in his stool.  Patient is currently on antibiotics vancomycin IV and p.o. Bactrim.  He had decreased p.o. intake yesterday because of his frequent time spent in the bathroom.  He  denies any sick contacts.  Denies any fever, chills, urinary symptoms.    ROS  All other systems are reviewed and are negative for acute change except as noted in the HPI.    Allergies  Allergen Reactions   Bee Venom Anaphylaxis   Lactose Intolerance (Gi) Other (See Comments)    GI upset     Past Medical History:  Diagnosis Date   [facility]'s esophagus dx 2016   Bronchitis    Chronic hepatitis C without hepatic coma ([place]) 10/31/2014   Depression    Diabetes ([place])    ED (erectile dysfunction)    GERD (gastroesophageal reflux disease)    Hepatitis C    Hiatal hernia 10/2014   3cm   Neuropathy    pancreatic ca 12/2020   S/P transmetatarsal amputation of foot, left ([place]) 11/28/2020     Past Surgical History:  Procedure Laterality Date   ABDOMINAL AORTOGRAM W/LOWER EXTREMITY N/A 06/24/2022   Procedure: ABDOMINAL AORTOGRAM W/LOWER EXTREMITY;  Surgeon: Broadus John, MD;  Location: [facility];  Service: Cardiovascular;  Laterality: N/A;   AMPUTATION Left 11/15/2020   Procedure: LEFT TRANSMETATARSALS AMPUTATION;  Surgeon: Newt Minion, MD;  Location: [facility];  Service: Orthopedics;  Laterality: Left;   APPENDECTOMY     BILIARY STENT PLACEMENT N/A 02/27/2021   Procedure: BILIARY STENT PLACEMENT;  Surgeon: Milus Banister, MD;  Location: [facility];  Service: Endoscopy;  Laterality: N/A;   BILIARY STENT PLACEMENT N/A 03/27/2021   Procedure: BILIARY STENT PLACEMENT;  Surgeon: Jackquline Denmark, MD;  Location: [facility];  Service: Endoscopy;  Laterality: N/A;   BIOPSY  03/27/2021   Procedure: BIOPSY;  Surgeon: Jackquline Denmark, MD;  [facility];  Service: Endoscopy;;   COLONOSCOPY N/A 02/16/2014   Procedure: COLONOSCOPY;  Surgeon: Gatha Mayer, MD;  [facility];  Service: Endoscopy;  Laterality: N/A;   COLONOSCOPY     ENDOSCOPIC RETROGRADE CHOLANGIOPANCREATOGRAPHY (ERCP) WITH PROPOFOL N/A 02/27/2021   Procedure: ENDOSCOPIC RETROGRADE  CHOLANGIOPANCREATOGRAPHY (ERCP) WITH PROPOFOL;  Surgeon: Milus Banister, MD;  [facility];  Service: Endoscopy;  Laterality: N/A;   ERCP N/A 03/27/2021   Procedure: ENDOSCOPIC RETROGRADE CHOLANGIOPANCREATOGRAPHY (ERCP);  Surgeon: Jackquline Denmark, MD;  Location: [facility];  Service: Endoscopy;  Laterality: N/A;   ESOPHAGOGASTRODUODENOSCOPY (EGD) WITH PROPOFOL N/A 05/06/2021   Procedure: ESOPHAGOGASTRODUODENOSCOPY (EGD) WITH PROPOFOL;  Surgeon: Mauri Pole, MD;  [facility];  Service: Endoscopy;  Laterality: N/A;   EUS N/A 02/27/2021   Procedure: UPPER ENDOSCOPIC ULTRASOUND (EUS) RADIAL;  Surgeon: Milus Banister, MD;  [facility];  Service: Endoscopy;  Laterality: N/A;   FINE NEEDLE ASPIRATION N/A 02/27/2021   Procedure: FINE NEEDLE ASPIRATION (FNA) LINEAR;  Surgeon: Milus Banister, MD;  [facility];  Service: Endoscopy;  Laterality: N/A;   IR IMAGING GUIDED PORT INSERTION  03/21/2021   SPHINCTEROTOMY  02/27/2021   Procedure: SPHINCTEROTOMY;  Surgeon: Milus Banister, MD;  [facility];  Service: Endoscopy;;   STENT REMOVAL  03/27/2021   Procedure: STENT REMOVAL;  Surgeon: Jackquline Denmark, MD;  [facility];  Service: Endoscopy;;   TONSILLECTOMY      Social History   Socioeconomic History   Marital status: Single    Spouse name: Not on file   Number of children: Not on file   Years of education: Not on file   Highest education level: Not on file  Occupational History   Not on file  Tobacco Use   Smoking status: Every Day    Packs/day: 0.50    Years: 35.00    Total pack years: 17.50    Types: Cigarettes   Smokeless tobacco: Never   Tobacco comments:    Currently being followed by the smoking cessation program   Vaping Use   Vaping Use: Never used  Substance and Sexual Activity   Alcohol use: Not Currently    Comment: previous   Drug use: No   Sexual activity: Not on file  Other Topics Concern   Not on file   Social History Narrative   Not on file   Social Determinants of Health   Financial Resource Strain: High Risk (03/13/2021)   Overall Financial Resource Strain (CARDIA)    Difficulty of Paying Living Expenses: Very hard  Food Insecurity: Food Insecurity Present (05/15/2021)   Hunger Vital Sign    Worried About Running Out of Food in the Last Year: Sometimes true    Ran Out of Food in the Last Year: Sometimes true  Transportation Needs: Unmet Transportation Needs (03/13/2021)   PRAPARE - Hydrologist (Medical): Yes    Lack of Transportation (Non-Medical): Yes  Physical Activity: Not on file  Stress: Not on file  Social Connections: Not on file  Intimate Partner Violence: Not At Risk (12/02/2021)   Humiliation, Afraid, Rape, and Kick questionnaire    Fear of Current or Ex-Partner: No    Emotionally Abused: No    Physically Abused: No    Sexually Abused: No    Family History  Problem Relation Age of Onset   Breast cancer Mother    Stroke Father    Alcohol abuse Father    Heart disease Maternal Grandfather    Pancreatic cancer Paternal Grandmother    Diabetes Paternal Grandfather    Colon cancer Neg Hx    Stomach cancer Neg Hx    Rectal cancer Neg Hx    Esophageal cancer Neg Hx      Current Outpatient Medications:    Accu-Chek Softclix Lancets lancets, Check blood glucose level by fingerstick 3-4 times per day., Disp: 100 each, Rfl: 3   acetaminophen (TYLENOL) 500 MG tablet, Take 1 tablet by mouth every 6 hours as needed for moderate pain. (Patient taking differently: Take 1,500 mg by mouth 2 (two) times daily as needed for moderate pain.), Disp: 60 tablet, Rfl: 3   albuterol (VENTOLIN HFA) 108 (90 Base) MCG/ACT inhaler, Inhale 2 puffs into the lungs every 6 hours as needed for wheezing or shortness of breath., Disp: 18 g, Rfl: 0   aspirin EC 81 MG tablet, Take 1 tablet by mouth daily., Disp: 100 tablet, Rfl: 0   atorvastatin (LIPITOR) 20 MG tablet,  Take 1 tablet by mouth daily., Disp: 90 tablet, Rfl: 1   Continuous Blood Gluc Sensor (FREESTYLE LIBRE 2 SENSOR) MISC, Use 1 sensor every 2 weeks as advised, Disp: 6 each, Rfl: 3   diphenoxylate-atropine (LOMOTIL) 2.5-0.025  MG tablet, Take 1 tablet by mouth 4 times daily as needed for diarrhea or loose stools., Disp: 60 tablet, Rfl: 1   DULoxetine (CYMBALTA) 60 MG capsule, Take 1 capsule (60 mg total) by mouth daily., Disp: 90 capsule, Rfl: 1   EPINEPHrine 0.3 mg/0.3 mL IJ SOAJ injection, Inject 0.3 mg into the muscle once as needed for anaphylaxis., Disp: 2 each, Rfl: 1   escitalopram (LEXAPRO) 10 MG tablet, Take 1 tablet (10 mg total) by mouth daily., Disp: 90 tablet, Rfl: 1   folic acid (FOLVITE) 1 MG tablet, Take 1 tablet (1 mg total) by mouth in the morning., Disp: , Rfl:    glucose blood (ACCU-CHEK GUIDE) test strip, Check blood glucose level by fingerstick 3 - 4 times per day., Disp: 100 each, Rfl: 12   insulin degludec (TRESIBA FLEXTOUCH) 100 UNIT/ML FlexTouch Pen, Inject 24 Units into the skin daily. (Patient taking differently: Inject 7 Units into the skin daily.), Disp: 9 mL, Rfl: 3   Insulin Lispro-aabc (LYUMJEV KWIKPEN) 100 UNIT/ML KwikPen, Inject 5-8 Units into the skin 3 (three) times daily before meals. (Patient taking differently: Inject 3-5 Units into the skin 3 (three) times daily before meals.), Disp: 15 mL, Rfl: 3   Insulin Pen Needle (TRUEPLUS PEN NEEDLES) 32G X 4 MM MISC, Use as instructed. Inject into the skin twice daily, Disp: 200 each, Rfl: 6   levofloxacin (LEVAQUIN) 750 MG tablet, Take 750 mg by mouth daily., Disp: , Rfl:    lidocaine-prilocaine (EMLA) cream, Apply topically as directed when needed., Disp: 30 g, Rfl: 0   lipase/protease/amylase (CREON) 36000 UNITS CPEP capsule, Take 1 capsule (36,000 Units total) by mouth 3 (three) times daily with meals. May take 1 additional capsule with snacks (Patient taking differently: Take 72,000 Units by mouth 3 (three) times daily  with meals.), Disp: 270 capsule, Rfl: 3   methadone (DOLOPHINE) 5 MG tablet, Take 1 tablet (5 mg total) by mouth every 12 (twelve) hours for 30 days, Disp: 60 tablet, Rfl: 0   mirtazapine (REMERON) 30 MG tablet, Take 1 tablet (30 mg total) by mouth at bedtime., Disp: 90 tablet, Rfl: 0   Multiple Vitamins-Minerals (THEREMS-M) TABS, Take 1 tablet by mouth in the morning., Disp: 90 tablet, Rfl: 3   mupirocin ointment (BACTROBAN) 2 %, Apply to wound surface with each dressing change., Disp: 22 g, Rfl: 0   nicotine (NICODERM CQ - DOSED IN MG/24 HOURS) 21 mg/24hr patch, Apply one patch daily for 12 weeks., Disp: 84 patch, Rfl: 1   ondansetron (ZOFRAN) 8 MG tablet, Take 1 tablet (8 mg total) by mouth 2 (two) times daily as needed. Start on day 3 after chemotherapy., Disp: 30 tablet, Rfl: 1   oxyCODONE (OXY IR/ROXICODONE) 5 MG immediate release tablet, Take 1 - 2 tablets (5 - 10 mg total) by mouth every 6 hours as needed for severe pain., Disp: 120 tablet, Rfl: 0   oxyCODONE ER (XTAMPZA ER) 9 MG C12A, Take 1 capsule by mouth every 12 hours., Disp: 60 capsule, Rfl: 0   pantoprazole (PROTONIX) 40 MG tablet, Take 1 tablet by mouth daily., Disp: 90 tablet, Rfl: 1   pregabalin (LYRICA) 75 MG capsule, Take 75 mg by mouth daily., Disp: , Rfl:    prochlorperazine (COMPAZINE) 10 MG tablet, Take 1 tablet (10 mg total) by mouth every 6 (six) hours as needed for nausea or vomiting., Disp: 30 tablet, Rfl: 1   VANCOMYCIN HCL IV, Inject 1 Dose into the vein daily., Disp: ,  Rfl:    varenicline (APO-VARENICLINE) 1 MG tablet, Take 1 tablet by mouth once daily in the morning with food, Disp: 84 tablet, Rfl: 1   zolpidem (AMBIEN) 10 MG tablet, Take 1 tablet (10 mg total) by mouth at bedtime as needed for sleep., Disp: 30 tablet, Rfl: 1  PHYSICAL EXAM: ECOG FS:2 - Symptomatic, <50% confined to bed    Vitals:   06/30/22 0921 06/30/22 1352  BP: (!) 151/100 (!) 147/85  Pulse: (!) 105 94  Resp: 17 18  Temp: 98.4 F (36.9  C) 97.8 F (36.6 C)  TempSrc: Oral   SpO2: 99% 100%  Weight: 121 lb 3 oz (55 kg)    Physical Exam Vitals and nursing note reviewed.  Constitutional:      Appearance: He is well-developed. He is not ill-appearing or toxic-appearing.     Comments: Uncomfortable appearing. Thin, cachetic male  HENT:     Head: Normocephalic.     Nose: Nose normal.     Mouth/Throat:     Mouth: Mucous membranes are dry.  Eyes:     Conjunctiva/sclera: Conjunctivae normal.  Neck:     Vascular: No JVD.  Cardiovascular:     Rate and Rhythm: Normal rate and regular rhythm.     Pulses: Normal pulses.     Heart sounds: Normal heart sounds.  Pulmonary:     Effort: Pulmonary effort is normal. No respiratory distress.     Breath sounds: Normal breath sounds. No stridor. No wheezing, rhonchi or rales.  Chest:     Chest wall: Tenderness present.     Comments: Tender to palpation of of mid sternum. No flail chest appreciated. No overlying ecchymosis Abdominal:     General: There is no distension.     Comments: TTP LLQ without peritoneal signs  Musculoskeletal:     Cervical back: Normal range of motion.  Skin:    General: Skin is warm and dry.  Neurological:     Mental Status: He is oriented to person, place, and time.        LABORATORY DATA: I have reviewed the data as listed    Latest Ref Rng & Units 06/30/2022    9:35 AM 06/24/2022    6:51 AM 06/18/2022   11:30 AM  CBC  WBC 4.0 - 10.5 K/uL 7.9   10.4   Hemoglobin 13.0 - 17.0 g/dL 10.4  10.2  9.5   Hematocrit 39.0 - 52.0 % 30.5  30.0  27.7   Platelets 150 - 400 K/uL 177   123         Latest Ref Rng & Units 06/30/2022    9:35 AM 06/24/2022    6:51 AM 06/18/2022   11:30 AM  CMP  Glucose 70 - 99 mg/dL 88  167  354   BUN 6 - 20 mg/dL <5  <3  <5   Creatinine 0.61 - 1.24 mg/dL 0.54  0.50  0.56   Sodium 135 - 145 mmol/L 139  139  135   Potassium 3.5 - 5.1 mmol/L 4.1  4.2  4.4   Chloride 98 - 111 mmol/L 106  100  104   CO2 22 - 32 mmol/L  30   25   Calcium 8.9 - 10.3 mg/dL 7.7   7.5   Total Protein 6.5 - 8.1 g/dL 6.9   6.3   Total Bilirubin 0.3 - 1.2 mg/dL 0.6   0.4   Alkaline Phos 38 - 126 U/L 282   288   AST  15 - 41 U/L 56   45   ALT 0 - 44 U/L 19   18        RADIOGRAPHIC STUDIES (from last 24 hours if applicable) I have personally reviewed the radiological images as listed and agreed with the findings in the report. DG Chest 2 View  Result Date: 06/30/2022 CLINICAL DATA:  Trauma, fall, chest pain EXAM: CHEST - 2 VIEW COMPARISON:  01/05/2022 FINDINGS: Cardiac size is within normal limits. There are no signs of pulmonary edema or focal pulmonary consolidation. There is no pleural effusion or pneumothorax. Tip of right IJ chest port is seen in superior vena cava. Deformities are seen in the lateral aspect of left eighth and ninth ribs. There is fracture in the body of sternum with 10 mm offset in alignment. Deformity in the proximal right humerus may be residual from previous injury. IMPRESSION: There is displaced fracture in the body of sternum. Deformities are seen in the lateral aspect of left eighth and ninth ribs suggesting recent or old fractures. There is no focal pulmonary consolidation. There is no pleural effusion or pneumothorax. These results will be called to the ordering clinician or representative by the Radiologist Assistant, and communication documented in the PACS or [employer]. Electronically Signed   By: Elmer Picker M.D.   On: 06/30/2022 12:24   DG Foot Complete Left  Result Date: 06/30/2022 Please see detailed radiograph report in office note.       Visit Diagnosis: 1. Closed fracture dislocation of sternum   2. Diarrhea, unspecified type   3. Fall, initial encounter   4. Malignant neoplasm of head of pancreas ([place])      No orders of the defined types were placed in this encounter.   All questions were answered. The patient knows to call the clinic with any problems, questions  or concerns. No barriers to learning was detected.  I have spent a total of 30 minutes minutes of face-to-face and non-face-to-face time, preparing to see the patient, obtaining and/or reviewing separately obtained history, performing a medically appropriate examination, counseling and educating the patient, ordering tests, documenting clinical information in the electronic health record, and care coordination (communications with other health care professionals or caregivers).    Thank you for allowing me to participate in the care of this patient.    Barrie Folk, PA-C Department of Hematology/Oncology [facility] at [facility] Phone: [phone]  [phone]    06/30/2022 2:29 PM

## 2022-06-30 NOTE — ED Notes (Signed)
Pt requests to keep his port accessed, states that they normally keep it accessed for about a week and then change the dressing, md aware, heparin flushed pt's port.  Pt verbalized understanding d/c instructions and follow up, pt from dpt via wc.

## 2022-07-01 ENCOUNTER — Other Ambulatory Visit (HOSPITAL_COMMUNITY): Payer: Self-pay

## 2022-07-02 ENCOUNTER — Inpatient Hospital Stay: Payer: Medicaid Other | Admitting: Hematology and Oncology

## 2022-07-02 ENCOUNTER — Telehealth: Payer: Self-pay | Admitting: Family Medicine

## 2022-07-02 ENCOUNTER — Inpatient Hospital Stay: Payer: Medicaid Other

## 2022-07-02 ENCOUNTER — Telehealth: Payer: Self-pay | Admitting: *Deleted

## 2022-07-02 ENCOUNTER — Other Ambulatory Visit: Payer: Medicaid Other

## 2022-07-02 NOTE — Telephone Encounter (Signed)
Received a message at 1:25 pm from Alda Lea, NP with inpatient Palliative had been unable to make contact with pt and that transportation was unable to get him to the door to bring him for his follow up appointment.  With the knowledge that pt has had a recent 10 mm posteriorly displace sternal fracture, this provider went to his apartment, tried to make contact by phone and then went to knock on his door. Knocked on patient's door, could hear tv and he indicated he could not get to the door but "is ok". Has multiple packages outside his door on the floor. 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  He also  Left MTA in 10/2020 which has not healed and he has developed osteomyelitis in the first 2 toe stumps.  He is being treated with Iv Vancomycin which was to end yesterday.  He indicated having 20 loose, large volume, watery stools in the prior 24-48 hours and feeling weak.  On Sunday night pt stated he fell trying to get off the toilet and hit his chest.  He went into the cancer center on Tuesday and received IV fluids.  While there he had a CXR which showed a 10 mm posteriorly displaced sternal fracture so he was sent to the ED for further work up by trauma specialist's request.  No significant internal injuries or hematoma noted and pt was sent home with pain meds.  Have tried to call patient again this evening.  Sounded like the phone was answered but I heard no words.  Have sent pt a my chart message that I plan to be there at 10 am to check on him unless he called for a different time.  If unable to get pt to the door in the am, will consider involving the apartment manager to check on the patient and see if he needs help.  After talking with Inpatient Palliative NP, she states that Oncologist, Dr Lorenso Courier is in agreement with Hospice Referral if that is what the patient wants to do.  His chemo treatment was held while he was  receiving IV antibiotics for osteomyelitis in the left foot per ID with follow up scheduled for tomorrow.  Damaris Hippo FNP-C

## 2022-07-02 NOTE — Telephone Encounter (Signed)
TCT patient as I was notified that he would not be here for his appt. Message from his transportation advised that pt told them he did not feel good and that he could hardly move. Pt has a recent sternal fracture from a fall . Was seen in the ED on 10/31 aznd returned home with port accessed. No answer to call. VM message was left for pt to return call at his convenience to let us know how he is.  Dr. Lorenso Courier aware.  [facility] made a home visit. Pt is ok. He dropped his phone and could not reach it. He is in increased pain due to sternal fracture. He gets up to the bathroom and then is back in bed. Palliative care talking to him about hospice care.

## 2022-07-03 ENCOUNTER — Ambulatory Visit: Payer: Medicaid Other | Admitting: Infectious Diseases

## 2022-07-03 ENCOUNTER — Other Ambulatory Visit: Payer: Self-pay

## 2022-07-03 ENCOUNTER — Other Ambulatory Visit: Payer: Medicaid Other | Admitting: Family Medicine

## 2022-07-03 ENCOUNTER — Encounter: Payer: Self-pay | Admitting: Family Medicine

## 2022-07-03 VITALS — BP 122/72 | HR 124 | Resp 20

## 2022-07-03 DIAGNOSIS — Y92009 Unspecified place in unspecified non-institutional (private) residence as the place of occurrence of the external cause: Secondary | ICD-10-CM

## 2022-07-03 DIAGNOSIS — C250 Malignant neoplasm of head of pancreas: Secondary | ICD-10-CM

## 2022-07-03 DIAGNOSIS — M869 Osteomyelitis, unspecified: Secondary | ICD-10-CM

## 2022-07-03 DIAGNOSIS — S2223XK Sternal manubrial dissociation, subsequent encounter for fracture with nonunion: Secondary | ICD-10-CM

## 2022-07-03 DIAGNOSIS — R197 Diarrhea, unspecified: Secondary | ICD-10-CM

## 2022-07-03 NOTE — Progress Notes (Unsigned)
[facility] Consult Note Telephone: [phone]  Fax: [phone]    Date of encounter: 07/03/22 10:37 AM PATIENT NAME: [name] [name] [address] [place] [address]   [id number] (home)  DOB: [birth year] MRN: [id number] PRIMARY CARE PROVIDER:    [name], NP,  [place] [address] [phone]  REFERRING PROVIDER:   [name], NP [place],  [place] [address] [phone]  RESPONSIBLE PARTY:    Contact Information     Name Relation Home Work Mobile   [name] [name] Sister [phone]  [phone]        I met face to face with patient in his home. Palliative Care was asked to follow this patient by consultation request of  [name] Pounds, NP to address advance care planning and complex medical decision making. This is a follow up visit   ASSESSMENT , SYMPTOM MANAGEMENT AND PLAN / RECOMMENDATIONS:   Malignant neoplasm of head of pancreas Complicated by multiple comorbid conditions like osteomyelitis and sternal fracture that may limit the ability to resume chemotherapy or have any additional treatment. Has already undergone whipple procedure and has mets to liver. Discussed potential readiness for [facility] and wishes for goals of care/MOST. Continue to follow.   Osteomyelitis of left foot Has completed IV Vancomycin, results of recent foot xray are unavailable and addressed in office note with no access.   Fall at home, subsequent encounter/closed sternal manubrial disassociation fracture with 10 mm posterior displacement Continues to use pain meds as directed, splint with cough/sneeze. Try to continue taking deep breaths and increase rest. Follow up for worsening SOB/DOE or uncontrolled pain   Diarrhea Resolved in the last 48 hours and was given IV fluids at cancer center on 06/30/22.  Advance Care  Planning/Goals of Care: Goals include to maximize quality of life and symptom management. Patient gave his permission to discuss. Our advance care planning conversation included a discussion about:    Exploration of personal, cultural or spiritual beliefs that might influence medical decisions -has concerns about what to do with his personal possessions if he goes to Hospice needing inpatient care Exploration of goals of care in the event of a sudden injury or illness  [facility] POA is [name] [name] [phone] followed by [name] [name] [phone]. [facility] POA grants authority to make decisions on life support, withdrawal, holding or withholding artificial nutrition.  Pt has requested cremation and organ donation. Review of an advance directive document-Pre-existing DNR and HC POA/living will.  Reviewed MOST form and pt not ready to complete Decision not to resuscitate or to de-escalate disease focused treatments due to poor prognosis. CODE STATUS: DNR    Follow up Palliative Care Visit: Palliative care will continue to follow for complex medical decision making, advance care planning, and clarification of goals. Return 2 weeks or prn.    This visit was coded based on medical decision making (MDM).  PPS: 50%  HOSPICE ELIGIBILITY/DIAGNOSIS: TBD  Chief Complaint:  Acute visit with welfare check as pt has been unable to ambulate following fall earlier in the week where he fractured his sternum.  HISTORY OF PRESENT ILLNESS:  [name] [name] is a 61 y.o. 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 Unable to reach pt by phone and unable to get him to the door.  Notes  indicate he missed ID appt today and did not call. Called 911 for welfare check.  Spoke with [place] office management ([facility] [employer] management) and explained role and inability to reach pt with recent injury.  They will try to have someone meet the  police to open the door for a welfare check. On policeman's arrival pt came to door in wc.  He states he can use his wc to get in the bathroom, he has been eating some and drinking some. He continues to smoke.  He states he missed his appointment this morning because he was in the bed.  Pt does have a DNR.  Pt's port remains accessed. Denies continued diarrhea, states "eating and drinking what I can."  States his blood sugars have been "normal". He has BLE weakness.  Discussed that with progression of cancer despite chemo and now that he has had osteomyelitis the Oncologist had indicated that there was not a lot more he could do and pt could qualify for Hospice if he was ready.   Advised that with Hospice he could have more frequent assistance and monitoring at home with help from [name] for bathing and dressing, nurse to come out 1-2 times per week for symptom management and if symptoms not manageable at home the potential to move to inpatient hospice bed for symptom management.  He asked if he would have to put his things in storage or move it out.  Advised he could address his possessions with friends or family if he wanted to decide.  Also advised that Hospice has SW that can help with some of these questions. Discussed options for MOST form.  Pt states he still has things on his "bucket list" to do so he is not ready for Hospice at present.  Advised that Hospice will not do much differently from Palliative but will check on him more frequently and help him with symptom management.  Left MOST form for his review and advised I will follow up in 2 weeks but if he needs assistance or wants to consider Hospice to call. Wants a "better quality of life."    History obtained from review of EMR, discussion with primary team, and interview with [name] [name].     Latest Ref Rng & Units 06/30/2022    9:35 AM 06/24/2022    6:51 AM 06/18/2022   11:30 AM  CBC  WBC 4.0 - 10.5 K/uL 7.9   10.4   Hemoglobin 13.0 - 17.0 g/dL  10.4  10.2  9.5   Hematocrit 39.0 - 52.0 % 30.5  30.0  27.7   Platelets 150 - 400 K/uL 177   123        Latest Ref Rng & Units 06/30/2022    9:35 AM 06/24/2022    6:51 AM 06/18/2022   11:30 AM  CMP  Glucose 70 - 99 mg/dL 88  167  354   BUN 6 - 20 mg/dL <5  <3  <5   Creatinine 0.61 - 1.24 mg/dL 0.54  0.50  0.56   Sodium 135 - 145 mmol/L 139  139  135   Potassium 3.5 - 5.1 mmol/L 4.1  4.2  4.4   Chloride 98 - 111 mmol/L 106  100  104   CO2 22 - 32 mmol/L 30   25   Calcium 8.9 - 10.3 mg/dL 7.7   7.5   Total Protein 6.5 - 8.1 g/dL 6.9   6.3   Total Bilirubin 0.3 - 1.2 mg/dL 0.6  0.4   Alkaline Phos 38 - 126 U/L 282   288   AST 15 - 41 U/L 56   45   ALT 0 - 44 U/L 19   18   06/30/22 CXR 2 view: FINDINGS: Cardiac size is within normal limits. There are no signs of pulmonary edema or focal pulmonary consolidation. There is no pleural effusion or pneumothorax. Tip of right IJ chest port is seen in superior vena cava. Deformities are seen in the lateral aspect of left eighth and ninth ribs. There is fracture in the body of sternum with 10 mm offset in alignment. Deformity in the proximal right humerus may be residual from previous injury.   IMPRESSION: There is displaced fracture in the body of sternum. Deformities are seen in the lateral aspect of left eighth and ninth ribs suggesting recent or old fractures.   There is no focal pulmonary consolidation. There is no pleural effusion or pneumothorax.  06/30/22 CT Chest wo Contrast: IMPRESSION: 1. Displaced sternal body fracture. Minimal retrosternal stranding but no confluent hematoma. 2. No additional acute traumatic injury to the chest. 3. Linear bandlike opacities in the lingula, left lower and left upper lobes, likely atelectasis. Adjacent to left lower lobe atelectasis is a 5 mm left lower lobe nodule. Given malignancy history, follow-up per oncologic protocol. 4. Aortic atherosclerosis.  Coronary artery calcifications. 5.  Emphysema.  I reviewed EMR for available labs, medications, imaging, studies and related documents.  There are no new records since last visit/Records reviewed and summarized above.   ROS General: NAD ENMT: denies dysphagia Cardiovascular: endorses some chest pain and need for splinting with cough, denies DOE Pulmonary: denies cough, denies increased SOB Abdomen: endorses fair appetite, denies diarrhea, endorses some recent incontinence of bowel with diarrhea GU: denies dysuria, endorses continence of urine MSK:  endorses weakness,  no falls since Sunday Skin: States he stubbed right 2nd toe and then scratched it, states left foot is good and he is doing daily foot care Neurological: endorses pain, denies insomnia Psych: Endorses positive mood Heme/lymph/immuno: denies bruises, abnormal bleeding  Physical Exam: Current and past weights: 121 lbs 3 ounces as of 06/30/22, 117 lbs 11.2 oz as of 04/27/22, weight 01/13/22 127.82 lbs (58.1 kg) Constitutional: NAD General: frail appearing, cachectic EYES: anicteric sclera, lids intact, no discharge  ENMT: intact hearing, oral mucous membranes moist CV: S1S2, regular tachycardic rate, some edema of left midfoot Pulmonary: CTAB, no increased work of breathing, intermittent cough, room air Abdomen: normo-active BS + 4 quadrants, soft and non tender, no ascites GU: deferred MSK: noted generalized wasting and muscle atrophy moves all extremities, self propelling with feet in wc Skin: warm and dry. Left foot is the one that has had TMA and pt has wrapped.  2nd toe of right foot with scabbed abrasion and mild erythema.  Neuro:  noted generalized weakness,  no cognitive impairment Psych: non-anxious affect, A and O x 3 Hem/lymph/immuno: no widespread bruising   Thank you for the opportunity to participate in the care of Mr. Jourdan.  The palliative care team will continue to follow. Please call our office at [phone] if we can be of additional  assistance.   Marijo Conception, FNP -C  COVID-19 PATIENT SCREENING TOOL Asked and negative response unless otherwise noted:   Have you had symptoms of covid, tested positive or been in contact with someone with symptoms/positive test in the past 5-10 days?  No

## 2022-07-04 ENCOUNTER — Encounter: Payer: Self-pay | Admitting: Family Medicine

## 2022-07-04 DIAGNOSIS — S2223XK Sternal manubrial dissociation, subsequent encounter for fracture with nonunion: Secondary | ICD-10-CM | POA: Insufficient documentation

## 2022-07-04 DIAGNOSIS — S2222XA Fracture of body of sternum, initial encounter for closed fracture: Secondary | ICD-10-CM | POA: Insufficient documentation

## 2022-07-06 ENCOUNTER — Other Ambulatory Visit (HOSPITAL_COMMUNITY): Payer: Self-pay

## 2022-07-06 ENCOUNTER — Encounter (HOSPITAL_BASED_OUTPATIENT_CLINIC_OR_DEPARTMENT_OTHER): Payer: Medicaid Other | Attending: General Surgery | Admitting: General Surgery

## 2022-07-07 ENCOUNTER — Other Ambulatory Visit: Payer: Self-pay | Admitting: Nurse Practitioner

## 2022-07-07 ENCOUNTER — Other Ambulatory Visit (HOSPITAL_COMMUNITY): Payer: Self-pay

## 2022-07-07 ENCOUNTER — Emergency Department (HOSPITAL_COMMUNITY): Payer: Medicaid Other

## 2022-07-07 ENCOUNTER — Other Ambulatory Visit: Payer: Self-pay

## 2022-07-07 ENCOUNTER — Inpatient Hospital Stay (HOSPITAL_COMMUNITY)
Admission: EM | Admit: 2022-07-07 | Discharge: 2022-07-17 | DRG: 435 | Disposition: A | Discharge status: Skilled Nursing Facility | Payer: Medicaid Other | Attending: Internal Medicine | Admitting: Internal Medicine

## 2022-07-07 ENCOUNTER — Encounter (HOSPITAL_COMMUNITY): Payer: Self-pay

## 2022-07-07 ENCOUNTER — Inpatient Hospital Stay (HOSPITAL_COMMUNITY): Payer: Medicaid Other

## 2022-07-07 DIAGNOSIS — Y92230 Patient room in hospital as the place of occurrence of the external cause: Secondary | ICD-10-CM | POA: Diagnosis not present

## 2022-07-07 DIAGNOSIS — R296 Repeated falls: Secondary | ICD-10-CM | POA: Diagnosis present

## 2022-07-07 DIAGNOSIS — I10 Essential (primary) hypertension: Secondary | ICD-10-CM | POA: Diagnosis present

## 2022-07-07 DIAGNOSIS — C259 Malignant neoplasm of pancreas, unspecified: Secondary | ICD-10-CM | POA: Diagnosis present

## 2022-07-07 DIAGNOSIS — Z8249 Family history of ischemic heart disease and other diseases of the circulatory system: Secondary | ICD-10-CM

## 2022-07-07 DIAGNOSIS — Z66 Do not resuscitate: Secondary | ICD-10-CM | POA: Diagnosis present

## 2022-07-07 DIAGNOSIS — Z9221 Personal history of antineoplastic chemotherapy: Secondary | ICD-10-CM

## 2022-07-07 DIAGNOSIS — D649 Anemia, unspecified: Secondary | ICD-10-CM | POA: Diagnosis present

## 2022-07-07 DIAGNOSIS — S91309A Unspecified open wound, unspecified foot, initial encounter: Secondary | ICD-10-CM

## 2022-07-07 DIAGNOSIS — L89610 Pressure ulcer of right heel, unstageable: Secondary | ICD-10-CM | POA: Diagnosis present

## 2022-07-07 DIAGNOSIS — F1010 Alcohol abuse, uncomplicated: Secondary | ICD-10-CM | POA: Diagnosis not present

## 2022-07-07 DIAGNOSIS — E739 Lactose intolerance, unspecified: Secondary | ICD-10-CM | POA: Diagnosis present

## 2022-07-07 DIAGNOSIS — R109 Unspecified abdominal pain: Secondary | ICD-10-CM | POA: Diagnosis present

## 2022-07-07 DIAGNOSIS — M869 Osteomyelitis, unspecified: Secondary | ICD-10-CM | POA: Diagnosis present

## 2022-07-07 DIAGNOSIS — D696 Thrombocytopenia, unspecified: Secondary | ICD-10-CM | POA: Diagnosis present

## 2022-07-07 DIAGNOSIS — L089 Local infection of the skin and subcutaneous tissue, unspecified: Secondary | ICD-10-CM | POA: Diagnosis present

## 2022-07-07 DIAGNOSIS — Z89421 Acquired absence of other right toe(s): Secondary | ICD-10-CM

## 2022-07-07 DIAGNOSIS — F5101 Primary insomnia: Secondary | ICD-10-CM

## 2022-07-07 DIAGNOSIS — Z7982 Long term (current) use of aspirin: Secondary | ICD-10-CM

## 2022-07-07 DIAGNOSIS — C787 Secondary malignant neoplasm of liver and intrahepatic bile duct: Principal | ICD-10-CM | POA: Diagnosis present

## 2022-07-07 DIAGNOSIS — E1142 Type 2 diabetes mellitus with diabetic polyneuropathy: Secondary | ICD-10-CM | POA: Diagnosis present

## 2022-07-07 DIAGNOSIS — K750 Abscess of liver: Principal | ICD-10-CM

## 2022-07-07 DIAGNOSIS — E1165 Type 2 diabetes mellitus with hyperglycemia: Secondary | ICD-10-CM | POA: Diagnosis present

## 2022-07-07 DIAGNOSIS — F17210 Nicotine dependence, cigarettes, uncomplicated: Secondary | ICD-10-CM | POA: Diagnosis present

## 2022-07-07 DIAGNOSIS — E1169 Type 2 diabetes mellitus with other specified complication: Secondary | ICD-10-CM | POA: Diagnosis present

## 2022-07-07 DIAGNOSIS — S2222XA Fracture of body of sternum, initial encounter for closed fracture: Secondary | ICD-10-CM | POA: Diagnosis present

## 2022-07-07 DIAGNOSIS — Z79899 Other long term (current) drug therapy: Secondary | ICD-10-CM

## 2022-07-07 DIAGNOSIS — Z7901 Long term (current) use of anticoagulants: Secondary | ICD-10-CM

## 2022-07-07 DIAGNOSIS — Z923 Personal history of irradiation: Secondary | ICD-10-CM

## 2022-07-07 DIAGNOSIS — K529 Noninfective gastroenteritis and colitis, unspecified: Secondary | ICD-10-CM | POA: Diagnosis not present

## 2022-07-07 DIAGNOSIS — Z9049 Acquired absence of other specified parts of digestive tract: Secondary | ICD-10-CM

## 2022-07-07 DIAGNOSIS — S2222XD Fracture of body of sternum, subsequent encounter for fracture with routine healing: Secondary | ICD-10-CM | POA: Diagnosis not present

## 2022-07-07 DIAGNOSIS — R509 Fever, unspecified: Secondary | ICD-10-CM | POA: Diagnosis not present

## 2022-07-07 DIAGNOSIS — K2970 Gastritis, unspecified, without bleeding: Secondary | ICD-10-CM | POA: Diagnosis present

## 2022-07-07 DIAGNOSIS — G8929 Other chronic pain: Secondary | ICD-10-CM | POA: Diagnosis present

## 2022-07-07 DIAGNOSIS — Z90411 Acquired partial absence of pancreas: Secondary | ICD-10-CM

## 2022-07-07 DIAGNOSIS — C250 Malignant neoplasm of head of pancreas: Secondary | ICD-10-CM

## 2022-07-07 DIAGNOSIS — Z794 Long term (current) use of insulin: Secondary | ICD-10-CM

## 2022-07-07 DIAGNOSIS — W06XXXA Fall from bed, initial encounter: Secondary | ICD-10-CM | POA: Diagnosis not present

## 2022-07-07 DIAGNOSIS — K7460 Unspecified cirrhosis of liver: Secondary | ICD-10-CM | POA: Diagnosis present

## 2022-07-07 DIAGNOSIS — Z91030 Bee allergy status: Secondary | ICD-10-CM

## 2022-07-07 DIAGNOSIS — R54 Age-related physical debility: Secondary | ICD-10-CM | POA: Diagnosis present

## 2022-07-07 DIAGNOSIS — E119 Type 2 diabetes mellitus without complications: Secondary | ICD-10-CM

## 2022-07-07 DIAGNOSIS — Z515 Encounter for palliative care: Secondary | ICD-10-CM

## 2022-07-07 DIAGNOSIS — E876 Hypokalemia: Secondary | ICD-10-CM | POA: Diagnosis present

## 2022-07-07 DIAGNOSIS — B182 Chronic viral hepatitis C: Secondary | ICD-10-CM | POA: Diagnosis present

## 2022-07-07 DIAGNOSIS — I2699 Other pulmonary embolism without acute cor pulmonale: Secondary | ICD-10-CM | POA: Diagnosis present

## 2022-07-07 DIAGNOSIS — L89620 Pressure ulcer of left heel, unstageable: Secondary | ICD-10-CM | POA: Diagnosis present

## 2022-07-07 DIAGNOSIS — S2220XG Unspecified fracture of sternum, subsequent encounter for fracture with delayed healing: Secondary | ICD-10-CM

## 2022-07-07 DIAGNOSIS — K769 Liver disease, unspecified: Secondary | ICD-10-CM

## 2022-07-07 DIAGNOSIS — Z8507 Personal history of malignant neoplasm of pancreas: Secondary | ICD-10-CM

## 2022-07-07 DIAGNOSIS — I81 Portal vein thrombosis: Secondary | ICD-10-CM | POA: Diagnosis present

## 2022-07-07 LAB — RAPID URINE DRUG SCREEN, HOSP PERFORMED
Amphetamines: NOT DETECTED
Barbiturates: NOT DETECTED
Benzodiazepines: NOT DETECTED
Cocaine: NOT DETECTED
Opiates: NOT DETECTED
Tetrahydrocannabinol: NOT DETECTED

## 2022-07-07 LAB — URINALYSIS, ROUTINE W REFLEX MICROSCOPIC
Bilirubin Urine: NEGATIVE
Glucose, UA: 150 mg/dL — AB
Hgb urine dipstick: NEGATIVE
Ketones, ur: NEGATIVE mg/dL
Leukocytes,Ua: NEGATIVE
Nitrite: NEGATIVE
Protein, ur: NEGATIVE mg/dL
Specific Gravity, Urine: 1.003 — ABNORMAL LOW (ref 1.005–1.030)
pH: 6 (ref 5.0–8.0)

## 2022-07-07 LAB — HEPATIC FUNCTION PANEL
ALT: 63 U/L — ABNORMAL HIGH (ref 0–44)
AST: 59 U/L — ABNORMAL HIGH (ref 15–41)
Albumin: 1.9 g/dL — ABNORMAL LOW (ref 3.5–5.0)
Alkaline Phosphatase: 234 U/L — ABNORMAL HIGH (ref 38–126)
Bilirubin, Direct: 1.1 mg/dL — ABNORMAL HIGH (ref 0.0–0.2)
Indirect Bilirubin: 0.9 mg/dL (ref 0.3–0.9)
Total Bilirubin: 2 mg/dL — ABNORMAL HIGH (ref 0.3–1.2)
Total Protein: 5.5 g/dL — ABNORMAL LOW (ref 6.5–8.1)

## 2022-07-07 LAB — CBC WITH DIFFERENTIAL/PLATELET
Abs Immature Granulocytes: 0.04 10*3/uL (ref 0.00–0.07)
Basophils Absolute: 0 10*3/uL (ref 0.0–0.1)
Basophils Relative: 1 %
Eosinophils Absolute: 0 10*3/uL (ref 0.0–0.5)
Eosinophils Relative: 1 %
HCT: 26.6 % — ABNORMAL LOW (ref 39.0–52.0)
Hemoglobin: 8.8 g/dL — ABNORMAL LOW (ref 13.0–17.0)
Immature Granulocytes: 1 %
Lymphocytes Relative: 4 %
Lymphs Abs: 0.4 10*3/uL — ABNORMAL LOW (ref 0.7–4.0)
MCH: 32.6 pg (ref 26.0–34.0)
MCHC: 33.1 g/dL (ref 30.0–36.0)
MCV: 98.5 fL (ref 80.0–100.0)
Monocytes Absolute: 1.6 10*3/uL — ABNORMAL HIGH (ref 0.1–1.0)
Monocytes Relative: 19 %
Neutro Abs: 6.2 10*3/uL (ref 1.7–7.7)
Neutrophils Relative %: 74 %
Platelets: 98 10*3/uL — ABNORMAL LOW (ref 150–400)
RBC: 2.7 MIL/uL — ABNORMAL LOW (ref 4.22–5.81)
RDW: 15.6 % — ABNORMAL HIGH (ref 11.5–15.5)
WBC: 8.3 10*3/uL (ref 4.0–10.5)
nRBC: 0 % (ref 0.0–0.2)

## 2022-07-07 LAB — BASIC METABOLIC PANEL
Anion gap: 7 (ref 5–15)
BUN: 6 mg/dL (ref 6–20)
CO2: 24 mmol/L (ref 22–32)
Calcium: 7.2 mg/dL — ABNORMAL LOW (ref 8.9–10.3)
Chloride: 101 mmol/L (ref 98–111)
Creatinine, Ser: 0.52 mg/dL — ABNORMAL LOW (ref 0.61–1.24)
GFR, Estimated: >60 mL/min (ref >60)
Glucose, Bld: 289 mg/dL — ABNORMAL HIGH (ref 70–99)
Potassium: 3 mmol/L — ABNORMAL LOW (ref 3.5–5.1)
Sodium: 132 mmol/L — ABNORMAL LOW (ref 135–145)

## 2022-07-07 LAB — ETHANOL: Alcohol, Ethyl (B): 106 mg/dL — ABNORMAL HIGH (ref <10)

## 2022-07-07 LAB — LIPASE, BLOOD: Lipase: 20 U/L (ref 11–51)

## 2022-07-07 MED ORDER — HYDROMORPHONE HCL 1 MG/ML IJ SOLN
1.0000 mg | Freq: Once | INTRAMUSCULAR | Status: AC
  Administered 2022-07-07: 1 mg via INTRAVENOUS
  Filled 2022-07-07: qty 1

## 2022-07-07 MED ORDER — SODIUM CHLORIDE 0.9 % IV BOLUS
1000.0000 mL | Freq: Once | INTRAVENOUS | Status: AC
  Administered 2022-07-07: 1000 mL via INTRAVENOUS

## 2022-07-07 MED ORDER — HEPARIN (PORCINE) 25000 UT/250ML-% IV SOLN
1000.0000 [IU]/h | INTRAVENOUS | Status: DC
  Filled 2022-07-07: qty 250

## 2022-07-07 MED ORDER — POTASSIUM CHLORIDE CRYS ER 20 MEQ PO TBCR
40.0000 meq | EXTENDED_RELEASE_TABLET | Freq: Once | ORAL | Status: AC
  Administered 2022-07-07: 40 meq via ORAL
  Filled 2022-07-07: qty 2

## 2022-07-07 MED ORDER — HYDROMORPHONE HCL 1 MG/ML IJ SOLN
0.5000 mg | INTRAMUSCULAR | Status: DC | PRN
  Administered 2022-07-08 – 2022-07-09 (×4): 1 mg via INTRAVENOUS
  Administered 2022-07-09: 0.5 mg via INTRAVENOUS
  Filled 2022-07-07 (×5): qty 1

## 2022-07-07 MED ORDER — IOHEXOL 350 MG/ML SOLN
100.0000 mL | Freq: Once | INTRAVENOUS | Status: AC | PRN
  Administered 2022-07-07: 100 mL via INTRAVENOUS

## 2022-07-07 MED ORDER — HEPARIN BOLUS VIA INFUSION
4000.0000 [IU] | Freq: Once | INTRAVENOUS | Status: DC
  Filled 2022-07-07: qty 4000

## 2022-07-07 MED ORDER — SODIUM CHLORIDE 0.9 % IV SOLN
2.0000 g | Freq: Once | INTRAVENOUS | Status: AC
  Administered 2022-07-08: 2 g via INTRAVENOUS
  Filled 2022-07-07: qty 12.5

## 2022-07-07 MED ORDER — SODIUM CHLORIDE 0.9 % IV BOLUS
1000.0000 mL | Freq: Once | INTRAVENOUS | Status: AC
  Administered 2022-07-08: 1000 mL via INTRAVENOUS

## 2022-07-07 MED ORDER — METRONIDAZOLE 500 MG/100ML IV SOLN
500.0000 mg | Freq: Once | INTRAVENOUS | Status: AC
  Administered 2022-07-08: 500 mg via INTRAVENOUS
  Filled 2022-07-07: qty 100

## 2022-07-07 MED ORDER — ONDANSETRON HCL 4 MG/2ML IJ SOLN
4.0000 mg | Freq: Once | INTRAMUSCULAR | Status: AC
  Administered 2022-07-07: 4 mg via INTRAVENOUS
  Filled 2022-07-07: qty 2

## 2022-07-07 MED ORDER — POTASSIUM CHLORIDE 10 MEQ/100ML IV SOLN
10.0000 meq | Freq: Once | INTRAVENOUS | Status: AC
  Administered 2022-07-08: 10 meq via INTRAVENOUS
  Filled 2022-07-07: qty 100

## 2022-07-07 NOTE — Progress Notes (Signed)
A consult was received from an ED physician for Cefepime per pharmacy dosing.  The patient's profile has been reviewed for ht/wt/allergies/indication/available labs.    A one time order has been placed for Cefepime 2gm IV.    Further antibiotics/pharmacy consults should be ordered by admitting physician if indicated.                       Thank you, Everette Rank, PharmD 07/07/2022  11:05 PM

## 2022-07-07 NOTE — ED Triage Notes (Signed)
Pt bib ems from home c/o dark, foul smelling urine and weakness x few days. Hx pancreatic cancer, pt states had etoh today.

## 2022-07-07 NOTE — Telephone Encounter (Signed)
Requested medication (s) are due for refill today -no  Requested medication (s) are on the active medication list -yes  Future visit scheduled -yes  Last refill: 05/27/22 #30 1RF  Notes to clinic: non delegated Rx  Requested Prescriptions  Pending Prescriptions Disp Refills   zolpidem (AMBIEN) 10 MG tablet 30 tablet 1    Sig: Take 1 tablet (10 mg total) by mouth at bedtime as needed for sleep.     Not Delegated - Psychiatry:  Anxiolytics/Hypnotics Failed - 07/07/2022 10:46 AM      Failed - This refill cannot be delegated      Failed - Urine Drug Screen completed in last 360 days      Passed - Valid encounter within last 6 months    Recent Outpatient Visits           2 months ago Encounter for annual physical exam   [place], Vernia Buff, NP   5 months ago Hospital discharge follow-up   [place], NP   6 months ago Essential hypertension   [place], Vernia Buff, NP   9 months ago Loose stools   [place], Vernia Buff, NP   12 months ago GERD without esophagitis   [place], Vernia Buff, NP       Future Appointments             In 1 month Gasper Sells, Terisa Starr, MD [place]. [facility], [facility]   In 4 months [name], NP [place]               Requested Prescriptions  Pending Prescriptions Disp Refills   zolpidem (AMBIEN) 10 MG tablet 30 tablet 1    Sig: Take 1 tablet (10 mg total) by mouth at bedtime as needed for sleep.     Not Delegated - Psychiatry:  Anxiolytics/Hypnotics Failed - 07/07/2022 10:46 AM      Failed - This refill cannot be delegated      Failed - Urine Drug Screen completed in last 360 days      Passed - Valid encounter within last 6 months    Recent  Outpatient Visits           2 months ago Encounter for annual physical exam   [place], Vernia Buff, NP   5 months ago Hospital discharge follow-up   [place], NP   6 months ago Essential hypertension   [name], Vernia Buff, NP   9 months ago Loose stools   [place], Vernia Buff, NP   12 months ago GERD without esophagitis   [place], Vernia Buff, NP       Future Appointments             In 1 month Gasper Sells, Terisa Starr, MD [place]. [facility]   In 4 months [name], NP [place]

## 2022-07-07 NOTE — Progress Notes (Signed)
ANTICOAGULATION CONSULT NOTE - Initial Consult  Pharmacy Consult for Heparin Indication: pulmonary embolus with large clot burder  Allergies  Allergen Reactions   Bee Venom Anaphylaxis   Lactose Intolerance (Gi) Other (See Comments)    GI upset    Patient Measurements: Height: '5\' 11"'$  (180.3 cm) Weight: 54 kg (119 lb 0.8 oz) IBW/kg (Calculated) : 75.3 Heparin Dosing Weight: 54kg  Vital Signs: Temp: 98.8 F (37.1 C) (11/07 1711) Temp Source: Oral (11/07 1711) BP: 156/89 (11/07 2030) Pulse Rate: 114 (11/07 2030)  Labs: Recent Labs    07/07/22 1757  HGB 8.8*  HCT 26.6*  PLT 98*  CREATININE 0.52*    Estimated Creatinine Clearance: 75 mL/min (A) (by C-G formula based on SCr of 0.52 mg/dL (L)).   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 and sternal fracture from fall. DM, HTN, chronic Hep C, TMA R foot, tobacco, uncontrolled pain   AC/Heme: IV heparin for PE. Thrombocytopenia with h/o (metastatic cancer, EToh, Hep C),  - Hgb 8.8, Plts 98, MD aware  Goal of Therapy:  Heparin level 0.3-0.7 units/ml Monitor platelets by anticoagulation protocol: Yes   Plan:  IV heparin 4000 unit bolus IV heparin infusion at 1000 units/hr Check heparin level in 8 hrs Daily HL and CBC   [name] [name], PharmD, BCPS Clinical Staff Pharmacist [email or web address] [name] [name], [name] Siegenthaler  [name] 07/07/2022,9:10 PM

## 2022-07-07 NOTE — ED Notes (Addendum)
RN did not give heparin due to CT error. MD aware and notified of issue. RN got in contact with radiology as well. Pt A&Ox4.

## 2022-07-07 NOTE — ED Notes (Signed)
Pt going to CT now

## 2022-07-07 NOTE — ED Provider Notes (Signed)
[facility] DEPT Provider Note   CSN: [id number] Arrival date & time: 07/07/22  1701     History  Chief Complaint  Patient presents with   Weakness    [name] [name] is a 61 y.o. male hx of pancreatic cancer, here presenting with weakness and persistent pain.  Patient has pancreatic cancer and has recently see palliative care and beginning to consider hospice.  Patient states that he is taking oxycodone for pain and it is not controlling his pain.  He states that he has been medicating himself with alcohol as well.  Patient has been feeling weak all over and has a hard time even standing up.  He also has diarrhea about 3-4 times a day. He usually transfers to a wheelchair but is too weak to even transfer.  Patient also recently had sternal fracture and was managed conservatively with pain medicine.  Patient states that he just wants to focus on pain control and may consider hospice care.  Patient also has noticed dark urine.   The history is provided by the patient.       Home Medications Prior to Admission medications   Medication Sig Start Date End Date Taking? Authorizing Provider  Accu-Chek Softclix Lancets lancets Check blood glucose level by fingerstick 3-4 times per day. 07/04/21   Charlott Rakes, MD  acetaminophen (TYLENOL) 500 MG tablet Take 1 tablet by mouth every 6 hours as needed for moderate pain. Patient taking differently: Take 1,500 mg by mouth 2 (two) times daily as needed for moderate pain. 03/23/22   [name], NP  albuterol (VENTOLIN HFA) 108 (90 Base) MCG/ACT inhaler Inhale 2 puffs into the lungs every 6 hours as needed for wheezing or shortness of breath. 05/10/22   Lincoln Brigham, PA-C  aspirin EC 81 MG tablet Take 1 tablet by mouth daily. 06/30/22   Charlott Rakes, MD  atorvastatin (LIPITOR) 20 MG tablet Take 1 tablet by mouth daily. 05/07/22   Charlott Rakes, MD  Continuous Blood Gluc Sensor (FREESTYLE LIBRE 2 SENSOR) MISC  Use 1 sensor every 2 weeks as advised 04/03/22   Philemon Kingdom, MD  diphenoxylate-atropine (LOMOTIL) 2.5-0.025 MG tablet Take 1 tablet by mouth 4 times daily as needed for diarrhea or loose stools. 06/30/22   [name], NP  DULoxetine (CYMBALTA) 60 MG capsule Take 1 capsule (60 mg total) by mouth daily. 02/17/22 05/18/22  Charlott Rakes, MD  EPINEPHrine 0.3 mg/0.3 mL IJ SOAJ injection Inject 0.3 mg into the muscle once as needed for anaphylaxis. 05/07/22   [name], NP  escitalopram (LEXAPRO) 10 MG tablet Take 1 tablet (10 mg total) by mouth daily. 05/06/22   [name], NP  folic acid (FOLVITE) 1 MG tablet Take 1 tablet (1 mg total) by mouth in the morning. 03/28/21   Rai, Ripudeep K, MD  glucose blood (ACCU-CHEK GUIDE) test strip Check blood glucose level by fingerstick 3 - 4 times per day. 07/04/21   Charlott Rakes, MD  insulin degludec (TRESIBA FLEXTOUCH) 100 UNIT/ML FlexTouch Pen Inject 24 Units into the skin daily. Patient taking differently: Inject 7 Units into the skin daily. 01/21/22   Philemon Kingdom, MD  Insulin Lispro-aabc (LYUMJEV KWIKPEN) 100 UNIT/ML KwikPen Inject 5-8 Units into the skin 3 (three) times daily before meals. Patient taking differently: Inject 3-5 Units into the skin 3 (three) times daily before meals. 01/21/22   Philemon Kingdom, MD  Insulin Pen Needle (TRUEPLUS PEN NEEDLES) 32G X 4 MM MISC  Use as instructed. Inject into the skin twice daily 03/04/21   [name], NP  levofloxacin (LEVAQUIN) 750 MG tablet Take 750 mg by mouth daily.    [provider]  lidocaine-prilocaine (EMLA) cream Apply topically as directed when needed. 05/10/22   Lincoln Brigham, PA-C  lipase/protease/amylase (CREON) 36000 UNITS CPEP capsule Take 1 capsule (36,000 Units total) by mouth 3 (three) times daily with meals. May take 1 additional capsule with snacks Patient taking differently: Take 72,000 Units by mouth 3 (three) times daily with meals. 03/30/22   [name], NP  methadone (DOLOPHINE) 5 MG tablet Take 1 tablet (5 mg total) by mouth every 12 (twelve) hours for 30 days 05/29/22     mirtazapine (REMERON) 30 MG tablet Take 1 tablet (30 mg total) by mouth at bedtime. 04/29/22 07/29/22  Charlott Rakes, MD  Multiple Vitamins-Minerals (THEREMS-M) TABS Take 1 tablet by mouth in the morning. 07/04/21   [name], NP  mupirocin ointment (BACTROBAN) 2 % Apply to wound surface with each dressing change. 02/04/22     nicotine (NICODERM CQ - DOSED IN MG/24 HOURS) 21 mg/24hr patch Apply one patch daily for 12 weeks. 04/14/22     ondansetron (ZOFRAN) 8 MG tablet Take 1 tablet (8 mg total) by mouth 2 (two) times daily as needed. Start on day 3 after chemotherapy. 05/10/22   Lincoln Brigham, PA-C  oxyCODONE (OXY IR/ROXICODONE) 5 MG immediate release tablet Take 1 - 2 tablets (5 - 10 mg total) by mouth every 6 hours as needed for severe pain. 06/30/22   Orson Slick, MD  oxyCODONE ER [facility] ER) 9 MG C12A Take 1 capsule by mouth every 12 hours. 05/28/22   Lincoln Brigham, PA-C  pantoprazole (PROTONIX) 40 MG tablet Take 1 tablet by mouth daily. 05/07/22   Charlott Rakes, MD  pregabalin (LYRICA) 75 MG capsule Take 75 mg by mouth daily. 05/22/22   [provider]  prochlorperazine (COMPAZINE) 10 MG tablet Take 1 tablet (10 mg total) by mouth every 6 (six) hours as needed for nausea or vomiting. 05/10/22   Lincoln Brigham, PA-C  varenicline (APO-VARENICLINE) 1 MG tablet Take 1 tablet by mouth once daily in the morning with food 03/24/22     zolpidem (AMBIEN) 10 MG tablet Take 1 tablet (10 mg total) by mouth at bedtime as needed for sleep. 05/27/22 07/31/22  [name], NP      Allergies    Bee venom and Lactose intolerance (gi)    Review of Systems   Review of Systems  Respiratory:  Positive for shortness of breath.   Gastrointestinal:  Positive for abdominal pain.  Neurological:  Positive for weakness.  All other systems reviewed and are  negative.   Physical Exam Updated Vital Signs BP 118/78 (BP Location: Left Arm)   Pulse (!) 103   Temp 98.8 F (37.1 C) (Oral)   Resp 19   Ht '5\' 11"'$  (1.803 m)   Wt 54 kg   SpO2 98%   BMI 16.60 kg/m  Physical Exam Vitals and nursing note reviewed.  Constitutional:      Comments: Chronically ill, jaundiced  HENT:     Head: Normocephalic.     Nose: Nose normal.     Mouth/Throat:     Mouth: Mucous membranes are dry.  Eyes:     Extraocular Movements: Extraocular movements intact.     Pupils: Pupils are equal, round, and reactive to light.  Cardiovascular:  Rate and Rhythm: Regular rhythm. Tachycardia present.     Pulses: Normal pulses.     Heart sounds: Normal heart sounds.  Pulmonary:     Effort: Pulmonary effort is normal.     Breath sounds: Normal breath sounds.  Abdominal:     General: Abdomen is flat.     Comments: Firm and mild epigastric tenderness  Musculoskeletal:     Cervical back: Normal range of motion and neck supple.     Comments: No obvious sacral decub ulcer  Skin:    General: Skin is warm.     Capillary Refill: Capillary refill takes less than 2 seconds.  Neurological:     General: No focal deficit present.     Mental Status: He is oriented to person, place, and time.  Psychiatric:        Mood and Affect: Mood normal.     ED Results / Procedures / Treatments   Labs (all labs ordered are listed, but only abnormal results are displayed) Labs Reviewed  C DIFFICILE QUICK SCREEN W PCR REFLEX    CBC WITH DIFFERENTIAL/PLATELET  LIPASE, BLOOD  ETHANOL  RAPID URINE DRUG SCREEN, HOSP PERFORMED  URINALYSIS, ROUTINE W REFLEX MICROSCOPIC  BASIC METABOLIC PANEL  HEPATIC FUNCTION PANEL    EKG None  Radiology No results found.  Procedures Procedures    CRITICAL CARE Performed by: Wandra Arthurs   Total critical care time: 30 minutes  Critical care time was exclusive of separately billable procedures and treating other patients.  Critical  care was necessary to treat or prevent imminent or life-threatening deterioration.  Critical care was time spent personally by me on the following activities: development of treatment plan with patient and/or surrogate as well as nursing, discussions with consultants, evaluation of patient's response to treatment, examination of patient, obtaining history from patient or surrogate, ordering and performing treatments and interventions, ordering and review of laboratory studies, ordering and review of radiographic studies, pulse oximetry and re-evaluation of patient's condition.   Medications Ordered in ED Medications  sodium chloride 0.9 % bolus 1,000 mL (1,000 mLs Intravenous New Bag/Given 07/07/22 1755)  HYDROmorphone (DILAUDID) injection 1 mg (1 mg Intravenous Given 07/07/22 1751)  ondansetron (ZOFRAN) injection 4 mg (4 mg Intravenous Given 07/07/22 1751)    ED Course/ Medical Decision Making/ A&P                           Medical Decision Making [name] [name] is a 61 y.o. male here presenting with weakness and diarrhea.  Patient has metastatic pancreatic cancer.  He also recently had a sternal fracture.  Consider PE versus spread of the cancer versus colitis.  Plan to get CBC and CMP and CTA chest and CT abdomen pelvis.  Will hydrate and likely will need admission for pain control.  Patient is DNR and is still considering whether he wants to be in hospice or not.  He likely will benefit from palliative care consult  9 pm I was called by radiology that patient has segmental PE with possible heart strain.  I discussed case with Dr. Myna Hidalgo initially and started patient on heparin.  Patient is adamant that he did not get a CT scan.  It turns out that the CT was done on a different patient.  I did talk to radiology and they asked me to put in another CT order.  11:11 PM CT scan was done and patient does have a subacute sternal  fracture which is consistent with the previous scan.  Patient also has  enteritis.  She he does have dilated CBD and possible liver abscess.  I ordered broad-spectrum antibiotics and also C. difficile and GI pathogen panel. Radiology recommend MRI of the abdomen with and without contrast.  Hospitalist will admit for possible liver abscess versus mets.   Problems Addressed: Hypokalemia: acute illness or injury Liver abscess: acute illness or injury  Amount and/or Complexity of Data Reviewed Labs: ordered. Decision-making details documented in ED Course. Radiology: ordered and independent interpretation performed. Decision-making details documented in ED Course. ECG/medicine tests: ordered and independent interpretation performed. Decision-making details documented in ED Course.  Risk Prescription drug management. Decision regarding hospitalization.    Final Clinical Impression(s) / ED Diagnoses Final diagnoses:  None    Rx / DC Orders ED Discharge Orders     None         Drenda Freeze, MD 07/07/22 2316

## 2022-07-08 ENCOUNTER — Encounter (HOSPITAL_COMMUNITY): Payer: Self-pay | Admitting: Family Medicine

## 2022-07-08 ENCOUNTER — Observation Stay (HOSPITAL_COMMUNITY): Payer: Medicaid Other

## 2022-07-08 ENCOUNTER — Encounter: Payer: Self-pay | Admitting: Gastroenterology

## 2022-07-08 DIAGNOSIS — D649 Anemia, unspecified: Secondary | ICD-10-CM | POA: Diagnosis present

## 2022-07-08 DIAGNOSIS — I81 Portal vein thrombosis: Secondary | ICD-10-CM | POA: Diagnosis present

## 2022-07-08 DIAGNOSIS — C259 Malignant neoplasm of pancreas, unspecified: Secondary | ICD-10-CM | POA: Diagnosis not present

## 2022-07-08 DIAGNOSIS — E876 Hypokalemia: Secondary | ICD-10-CM

## 2022-07-08 DIAGNOSIS — D696 Thrombocytopenia, unspecified: Secondary | ICD-10-CM | POA: Diagnosis present

## 2022-07-08 DIAGNOSIS — L89620 Pressure ulcer of left heel, unstageable: Secondary | ICD-10-CM | POA: Diagnosis present

## 2022-07-08 DIAGNOSIS — Z66 Do not resuscitate: Secondary | ICD-10-CM | POA: Diagnosis present

## 2022-07-08 DIAGNOSIS — B182 Chronic viral hepatitis C: Secondary | ICD-10-CM | POA: Diagnosis present

## 2022-07-08 DIAGNOSIS — M86072 Acute hematogenous osteomyelitis, left ankle and foot: Secondary | ICD-10-CM

## 2022-07-08 DIAGNOSIS — K529 Noninfective gastroenteritis and colitis, unspecified: Secondary | ICD-10-CM | POA: Diagnosis present

## 2022-07-08 DIAGNOSIS — M869 Osteomyelitis, unspecified: Secondary | ICD-10-CM | POA: Diagnosis present

## 2022-07-08 DIAGNOSIS — I10 Essential (primary) hypertension: Secondary | ICD-10-CM | POA: Diagnosis present

## 2022-07-08 DIAGNOSIS — G8929 Other chronic pain: Secondary | ICD-10-CM | POA: Diagnosis present

## 2022-07-08 DIAGNOSIS — Y92230 Patient room in hospital as the place of occurrence of the external cause: Secondary | ICD-10-CM | POA: Diagnosis not present

## 2022-07-08 DIAGNOSIS — Z89421 Acquired absence of other right toe(s): Secondary | ICD-10-CM | POA: Diagnosis not present

## 2022-07-08 DIAGNOSIS — R296 Repeated falls: Secondary | ICD-10-CM | POA: Diagnosis present

## 2022-07-08 DIAGNOSIS — E1142 Type 2 diabetes mellitus with diabetic polyneuropathy: Secondary | ICD-10-CM | POA: Diagnosis present

## 2022-07-08 DIAGNOSIS — C787 Secondary malignant neoplasm of liver and intrahepatic bile duct: Secondary | ICD-10-CM | POA: Diagnosis present

## 2022-07-08 DIAGNOSIS — L89610 Pressure ulcer of right heel, unstageable: Secondary | ICD-10-CM | POA: Diagnosis present

## 2022-07-08 DIAGNOSIS — W06XXXA Fall from bed, initial encounter: Secondary | ICD-10-CM | POA: Diagnosis not present

## 2022-07-08 DIAGNOSIS — E1165 Type 2 diabetes mellitus with hyperglycemia: Secondary | ICD-10-CM | POA: Diagnosis present

## 2022-07-08 DIAGNOSIS — E1169 Type 2 diabetes mellitus with other specified complication: Secondary | ICD-10-CM | POA: Diagnosis present

## 2022-07-08 DIAGNOSIS — E739 Lactose intolerance, unspecified: Secondary | ICD-10-CM | POA: Diagnosis present

## 2022-07-08 DIAGNOSIS — K7460 Unspecified cirrhosis of liver: Secondary | ICD-10-CM | POA: Diagnosis present

## 2022-07-08 DIAGNOSIS — E119 Type 2 diabetes mellitus without complications: Secondary | ICD-10-CM

## 2022-07-08 DIAGNOSIS — Z515 Encounter for palliative care: Secondary | ICD-10-CM | POA: Diagnosis not present

## 2022-07-08 DIAGNOSIS — E1159 Type 2 diabetes mellitus with other circulatory complications: Secondary | ICD-10-CM

## 2022-07-08 DIAGNOSIS — K769 Liver disease, unspecified: Secondary | ICD-10-CM | POA: Diagnosis not present

## 2022-07-08 DIAGNOSIS — R109 Unspecified abdominal pain: Secondary | ICD-10-CM | POA: Diagnosis not present

## 2022-07-08 DIAGNOSIS — Z794 Long term (current) use of insulin: Secondary | ICD-10-CM | POA: Diagnosis not present

## 2022-07-08 LAB — CBG MONITORING, ED
Glucose-Capillary: 232 mg/dL — ABNORMAL HIGH (ref 70–99)
Glucose-Capillary: 132 mg/dL — ABNORMAL HIGH (ref 70–99)
Glucose-Capillary: 182 mg/dL — ABNORMAL HIGH (ref 70–99)
Glucose-Capillary: 213 mg/dL — ABNORMAL HIGH (ref 70–99)

## 2022-07-08 LAB — COMPREHENSIVE METABOLIC PANEL
ALT: 48 U/L — ABNORMAL HIGH (ref 0–44)
AST: 37 U/L (ref 15–41)
Albumin: 1.6 g/dL — ABNORMAL LOW (ref 3.5–5.0)
Alkaline Phosphatase: 198 U/L — ABNORMAL HIGH (ref 38–126)
Anion gap: 3 — ABNORMAL LOW (ref 5–15)
BUN: <5 mg/dL — ABNORMAL LOW (ref 6–20)
CO2: 26 mmol/L (ref 22–32)
Calcium: 7.1 mg/dL — ABNORMAL LOW (ref 8.9–10.3)
Chloride: 106 mmol/L (ref 98–111)
Creatinine, Ser: 0.39 mg/dL — ABNORMAL LOW (ref 0.61–1.24)
GFR, Estimated: >60 mL/min (ref >60)
Glucose, Bld: 220 mg/dL — ABNORMAL HIGH (ref 70–99)
Potassium: 3.4 mmol/L — ABNORMAL LOW (ref 3.5–5.1)
Sodium: 135 mmol/L (ref 135–145)
Total Bilirubin: 2 mg/dL — ABNORMAL HIGH (ref 0.3–1.2)
Total Protein: 4.6 g/dL — ABNORMAL LOW (ref 6.5–8.1)

## 2022-07-08 LAB — HEPARIN LEVEL (UNFRACTIONATED)
Heparin Unfractionated: <0.1 IU/mL — ABNORMAL LOW (ref 0.30–0.70)
Heparin Unfractionated: <0.1 IU/mL — ABNORMAL LOW (ref 0.30–0.70)

## 2022-07-08 LAB — APTT: aPTT: 39 seconds — ABNORMAL HIGH (ref 24–36)

## 2022-07-08 LAB — CBC
HCT: 23.2 % — ABNORMAL LOW (ref 39.0–52.0)
Hemoglobin: 7.9 g/dL — ABNORMAL LOW (ref 13.0–17.0)
MCH: 32.9 pg (ref 26.0–34.0)
MCHC: 34.1 g/dL (ref 30.0–36.0)
MCV: 96.7 fL (ref 80.0–100.0)
Platelets: 79 10*3/uL — ABNORMAL LOW (ref 150–400)
RBC: 2.4 MIL/uL — ABNORMAL LOW (ref 4.22–5.81)
RDW: 15.7 % — ABNORMAL HIGH (ref 11.5–15.5)
WBC: 6.9 10*3/uL (ref 4.0–10.5)
nRBC: 0 % (ref 0.0–0.2)

## 2022-07-08 LAB — PROTIME-INR
INR: 1.6 — ABNORMAL HIGH (ref 0.8–1.2)
Prothrombin Time: 19.2 seconds — ABNORMAL HIGH (ref 11.4–15.2)

## 2022-07-08 LAB — MAGNESIUM: Magnesium: 1.5 mg/dL — ABNORMAL LOW (ref 1.7–2.4)

## 2022-07-08 LAB — GLUCOSE, CAPILLARY: Glucose-Capillary: 197 mg/dL — ABNORMAL HIGH (ref 70–99)

## 2022-07-08 LAB — PHOSPHORUS: Phosphorus: 1.9 mg/dL — ABNORMAL LOW (ref 2.5–4.6)

## 2022-07-08 MED ORDER — POTASSIUM CHLORIDE IN NACL 20-0.9 MEQ/L-% IV SOLN
INTRAVENOUS | Status: AC
  Filled 2022-07-08: qty 1000

## 2022-07-08 MED ORDER — ACETAMINOPHEN 650 MG RE SUPP: 650.0000 mg | Freq: Four times a day (QID) | RECTAL | Status: DC | PRN

## 2022-07-08 MED ORDER — ESCITALOPRAM OXALATE 10 MG PO TABS
10.0000 mg | ORAL_TABLET | Freq: Every day | ORAL | Status: DC
  Administered 2022-07-08 – 2022-07-17 (×10): 10 mg via ORAL
  Filled 2022-07-08 (×10): qty 1

## 2022-07-08 MED ORDER — ATORVASTATIN CALCIUM 20 MG PO TABS
20.0000 mg | ORAL_TABLET | Freq: Every day | ORAL | Status: DC
  Administered 2022-07-08 – 2022-07-16 (×9): 20 mg via ORAL
  Filled 2022-07-08: qty 1
  Filled 2022-07-08: qty 2
  Filled 2022-07-08 (×7): qty 1

## 2022-07-08 MED ORDER — MAGNESIUM SULFATE 4 GM/100ML IV SOLN
4.0000 g | Freq: Once | INTRAVENOUS | Status: AC
  Administered 2022-07-08: 4 g via INTRAVENOUS
  Filled 2022-07-08: qty 100

## 2022-07-08 MED ORDER — PANCRELIPASE (LIP-PROT-AMYL) 36000-114000 UNITS PO CPEP
72000.0000 [IU] | ORAL_CAPSULE | Freq: Three times a day (TID) | ORAL | Status: DC
  Administered 2022-07-08 – 2022-07-17 (×28): 72000 [IU] via ORAL
  Filled 2022-07-08 (×2): qty 2
  Filled 2022-07-08: qty 6
  Filled 2022-07-08 (×4): qty 2
  Filled 2022-07-08 (×2): qty 6
  Filled 2022-07-08 (×10): qty 2
  Filled 2022-07-08: qty 6
  Filled 2022-07-08 (×9): qty 2
  Filled 2022-07-08 (×3): qty 6

## 2022-07-08 MED ORDER — POTASSIUM CHLORIDE CRYS ER 20 MEQ PO TBCR
40.0000 meq | EXTENDED_RELEASE_TABLET | Freq: Once | ORAL | Status: AC
  Administered 2022-07-08: 40 meq via ORAL
  Filled 2022-07-08: qty 2

## 2022-07-08 MED ORDER — INSULIN ASPART 100 UNIT/ML IJ SOLN
0.0000 [IU] | INTRAMUSCULAR | Status: DC
  Administered 2022-07-08: 1 [IU] via SUBCUTANEOUS
  Administered 2022-07-08: 2 [IU] via SUBCUTANEOUS
  Administered 2022-07-08: 1 [IU] via SUBCUTANEOUS
  Administered 2022-07-08: 2 [IU] via SUBCUTANEOUS
  Administered 2022-07-09: 4 [IU] via SUBCUTANEOUS
  Administered 2022-07-09: 2 [IU] via SUBCUTANEOUS
  Administered 2022-07-09: 3 [IU] via SUBCUTANEOUS
  Administered 2022-07-09 (×2): 1 [IU] via SUBCUTANEOUS
  Administered 2022-07-10: 3 [IU] via SUBCUTANEOUS
  Administered 2022-07-10 (×3): 2 [IU] via SUBCUTANEOUS
  Administered 2022-07-10: 1 [IU] via SUBCUTANEOUS
  Administered 2022-07-11: 2 [IU] via SUBCUTANEOUS
  Administered 2022-07-11: 1 [IU] via SUBCUTANEOUS
  Administered 2022-07-11: 2 [IU] via SUBCUTANEOUS
  Administered 2022-07-11 – 2022-07-12 (×3): 1 [IU] via SUBCUTANEOUS
  Administered 2022-07-12: 2 [IU] via SUBCUTANEOUS
  Administered 2022-07-12: 1 [IU] via SUBCUTANEOUS
  Administered 2022-07-12 – 2022-07-13 (×2): 2 [IU] via SUBCUTANEOUS
  Administered 2022-07-13: 1 [IU] via SUBCUTANEOUS
  Administered 2022-07-13: 2 [IU] via SUBCUTANEOUS
  Administered 2022-07-13: 1 [IU] via SUBCUTANEOUS
  Administered 2022-07-14: 2 [IU] via SUBCUTANEOUS
  Administered 2022-07-14: 1 [IU] via SUBCUTANEOUS
  Administered 2022-07-14: 2 [IU] via SUBCUTANEOUS
  Administered 2022-07-14: 1 [IU] via SUBCUTANEOUS
  Administered 2022-07-14: 3 [IU] via SUBCUTANEOUS
  Administered 2022-07-15: 2 [IU] via SUBCUTANEOUS
  Administered 2022-07-15 – 2022-07-16 (×6): 1 [IU] via SUBCUTANEOUS
  Filled 2022-07-08: qty 0.06

## 2022-07-08 MED ORDER — LORAZEPAM 2 MG/ML IJ SOLN: 0.0000 mg | Freq: Two times a day (BID) | INTRAMUSCULAR | Status: AC

## 2022-07-08 MED ORDER — ONDANSETRON HCL 4 MG/2ML IJ SOLN
4.0000 mg | Freq: Four times a day (QID) | INTRAMUSCULAR | Status: DC | PRN
  Administered 2022-07-13 – 2022-07-15 (×3): 4 mg via INTRAVENOUS
  Filled 2022-07-08 (×3): qty 2

## 2022-07-08 MED ORDER — LORAZEPAM 2 MG/ML IJ SOLN: 1.0000 mg | INTRAMUSCULAR | Status: AC | PRN

## 2022-07-08 MED ORDER — ONDANSETRON HCL 4 MG PO TABS: 4.0000 mg | ORAL_TABLET | Freq: Four times a day (QID) | ORAL | Status: DC | PRN

## 2022-07-08 MED ORDER — HEPARIN BOLUS VIA INFUSION
1700.0000 [IU] | Freq: Once | INTRAVENOUS | Status: AC
  Administered 2022-07-08: 1700 [IU] via INTRAVENOUS
  Filled 2022-07-08: qty 1700

## 2022-07-08 MED ORDER — GADOBUTROL 1 MMOL/ML IV SOLN
5.5000 mL | Freq: Once | INTRAVENOUS | Status: AC | PRN
  Administered 2022-07-08: 5.5 mL via INTRAVENOUS

## 2022-07-08 MED ORDER — ZOLPIDEM TARTRATE 10 MG PO TABS
10.0000 mg | ORAL_TABLET | Freq: Every evening | ORAL | Status: DC | PRN
  Administered 2022-07-09 – 2022-07-16 (×8): 10 mg via ORAL
  Filled 2022-07-08 (×8): qty 1

## 2022-07-08 MED ORDER — POLYETHYLENE GLYCOL 3350 17 G PO PACK: 17.0000 g | PACK | Freq: Every day | ORAL | Status: DC | PRN

## 2022-07-08 MED ORDER — LORAZEPAM 1 MG PO TABS: 1.0000 mg | ORAL_TABLET | ORAL | Status: AC | PRN

## 2022-07-08 MED ORDER — THIAMINE MONONITRATE 100 MG PO TABS
100.0000 mg | ORAL_TABLET | Freq: Every day | ORAL | Status: DC
  Administered 2022-07-08 – 2022-07-17 (×10): 100 mg via ORAL
  Filled 2022-07-08 (×11): qty 1

## 2022-07-08 MED ORDER — PREGABALIN 75 MG PO CAPS
75.0000 mg | ORAL_CAPSULE | Freq: Every day | ORAL | Status: DC
  Administered 2022-07-08 – 2022-07-17 (×10): 75 mg via ORAL
  Filled 2022-07-08 (×10): qty 1

## 2022-07-08 MED ORDER — LORAZEPAM 2 MG/ML IJ SOLN
0.0000 mg | Freq: Four times a day (QID) | INTRAMUSCULAR | Status: AC
  Administered 2022-07-08: 2 mg via INTRAVENOUS
  Filled 2022-07-08: qty 1

## 2022-07-08 MED ORDER — ASPIRIN 81 MG PO TBEC
81.0000 mg | DELAYED_RELEASE_TABLET | Freq: Every day | ORAL | Status: DC
  Administered 2022-07-08 – 2022-07-17 (×10): 81 mg via ORAL
  Filled 2022-07-08 (×10): qty 1

## 2022-07-08 MED ORDER — ADULT MULTIVITAMIN W/MINERALS CH
1.0000 | ORAL_TABLET | Freq: Every day | ORAL | Status: DC
  Administered 2022-07-08 – 2022-07-17 (×10): 1 via ORAL
  Filled 2022-07-08 (×10): qty 1

## 2022-07-08 MED ORDER — PIPERACILLIN-TAZOBACTAM 3.375 G IVPB
3.3750 g | Freq: Three times a day (TID) | INTRAVENOUS | Status: DC
  Administered 2022-07-08 (×2): 3.375 g via INTRAVENOUS
  Filled 2022-07-08 (×2): qty 50

## 2022-07-08 MED ORDER — BISACODYL 5 MG PO TBEC: 5.0000 mg | DELAYED_RELEASE_TABLET | Freq: Every day | ORAL | Status: DC | PRN

## 2022-07-08 MED ORDER — HEPARIN (PORCINE) 25000 UT/250ML-% IV SOLN
1400.0000 [IU]/h | INTRAVENOUS | Status: DC
  Administered 2022-07-08: 1000 [IU]/h via INTRAVENOUS
  Administered 2022-07-09: 1400 [IU]/h via INTRAVENOUS
  Filled 2022-07-08 (×2): qty 250

## 2022-07-08 MED ORDER — ACETAMINOPHEN 325 MG PO TABS
650.0000 mg | ORAL_TABLET | Freq: Four times a day (QID) | ORAL | Status: DC | PRN
  Administered 2022-07-09 – 2022-07-16 (×3): 650 mg via ORAL
  Filled 2022-07-08 (×3): qty 2

## 2022-07-08 MED ORDER — ALBUTEROL SULFATE HFA 108 (90 BASE) MCG/ACT IN AERS: 2.0000 | INHALATION_SPRAY | RESPIRATORY_TRACT | Status: DC | PRN

## 2022-07-08 MED ORDER — PANCRELIPASE (LIP-PROT-AMYL) 12000-38000 UNITS PO CPEP
36000.0000 [IU] | ORAL_CAPSULE | ORAL | Status: DC | PRN
  Administered 2022-07-13: 36000 [IU] via ORAL
  Filled 2022-07-08: qty 3

## 2022-07-08 MED ORDER — HEPARIN BOLUS VIA INFUSION
1500.0000 [IU] | Freq: Once | INTRAVENOUS | Status: AC
  Administered 2022-07-08: 1500 [IU] via INTRAVENOUS
  Filled 2022-07-08: qty 1500

## 2022-07-08 MED ORDER — ALBUTEROL SULFATE (2.5 MG/3ML) 0.083% IN NEBU: 2.5000 mg | INHALATION_SOLUTION | RESPIRATORY_TRACT | Status: DC | PRN

## 2022-07-08 MED ORDER — PANTOPRAZOLE SODIUM 40 MG PO TBEC
40.0000 mg | DELAYED_RELEASE_TABLET | Freq: Every day | ORAL | Status: DC
  Administered 2022-07-08 – 2022-07-17 (×10): 40 mg via ORAL
  Filled 2022-07-08 (×10): qty 1

## 2022-07-08 MED ORDER — FOLIC ACID 1 MG PO TABS
1.0000 mg | ORAL_TABLET | Freq: Every day | ORAL | Status: DC
  Administered 2022-07-08 – 2022-07-17 (×10): 1 mg via ORAL
  Filled 2022-07-08 (×10): qty 1

## 2022-07-08 MED ORDER — THIAMINE HCL 100 MG/ML IJ SOLN
100.0000 mg | Freq: Every day | INTRAMUSCULAR | Status: DC
  Filled 2022-07-08: qty 2

## 2022-07-08 MED ORDER — POTASSIUM PHOSPHATES 15 MMOLE/5ML IV SOLN
30.0000 mmol | Freq: Once | INTRAVENOUS | Status: AC
  Administered 2022-07-08: 30 mmol via INTRAVENOUS
  Filled 2022-07-08: qty 10

## 2022-07-08 MED ORDER — MIRTAZAPINE 30 MG PO TABS
30.0000 mg | ORAL_TABLET | Freq: Every day | ORAL | Status: DC
  Administered 2022-07-08 – 2022-07-16 (×9): 30 mg via ORAL
  Filled 2022-07-08 (×9): qty 1

## 2022-07-08 NOTE — H&P (Signed)
History and Physical    ION GONNELLA LHT:[id number] DOB: 05-19-1961 DOA: 07/07/2022  PCP: [name], NP   Patient coming from: Home   Chief Complaint: Uncontrolled pain, diarrhea, general weakness   HPI: GREY RAKESTRAW is a 61 y.o. male with medical history significant for type 2 diabetes mellitus, Barrett esophagus, chronic hepatitis C, alcohol abuse, poorly healing left TMA with osteomyelitis, recent sternal body fracture, and pancreatic cancer status post Whipple with imaging at [facility] in September concerning for liver metastasis, now presenting to the emergency department with uncontrolled pain, generalized weakness and debility, and diarrhea.  Patient reports that he has been having approximately 4 loose stools every day and has had difficulty controlling his sternal and abdominal pain with his home medications.  He also complains of progressive general weakness, now with great difficulty transferring to and from his wheelchair.  He has not noted any subjective fever or chills.   ED Course: Upon arrival to the ED, patient is found to be afebrile and saturating well on room air with mild tachycardia and stable blood pressure.  He is in sinus or ectopic atrial tachycardia with rate 104 on EKG.  CTA chest is negative for PE but notable for small bilateral pleural effusions and unchanged sternal fracture and pulmonary nodule.  CT of the abdomen pelvis is notable for new 5.5 cm right liver lobe lesion which is likely metastatic but CBD enhancement is concerning for cholangitis with abscess.  Labs notable for glucose 289, potassium 3.0, alkaline phosphatase 234, albumin 1.9, AST 59, AST 63, total bilirubin 2.0, hemoglobin 8.8, and platelets 98,000.  Patient was treated with Dilaudid, Zofran, oral potassium, and a liter of normal saline in the ED.  Review of Systems:  All other systems reviewed and apart from HPI, are negative.  Past Medical History:  Diagnosis Date   Barrett's  esophagus dx 2016   Bronchitis    Chronic hepatitis C without hepatic coma ([place]) 10/31/2014   Depression    Diabetes [facility])    ED (erectile dysfunction)    GERD (gastroesophageal reflux disease)    Hepatitis C    Hiatal hernia 10/2014   3cm   Jaundice 02/26/2021   Neuropathy    pancreatic ca 12/2020   Pneumonia 05/14/2020   S/P transmetatarsal amputation of foot, left ([place]) 11/28/2020   Sepsis ([place]) 10/28/2020    Past Surgical History:  Procedure Laterality Date   ABDOMINAL AORTOGRAM W/LOWER EXTREMITY N/A 06/24/2022   Procedure: ABDOMINAL AORTOGRAM W/LOWER EXTREMITY;  Surgeon: Broadus John, MD;  Location: [place] [facility];  Service: Cardiovascular;  Laterality: N/A;   AMPUTATION Left 11/15/2020   Procedure: LEFT TRANSMETATARSALS AMPUTATION;  Surgeon: Newt Minion, MD;  Location: [place];  Service: Orthopedics;  Laterality: Left;   APPENDECTOMY     BILIARY STENT PLACEMENT N/A 02/27/2021   Procedure: BILIARY STENT PLACEMENT;  Surgeon: Milus Banister, MD;  Location: [facility];  Service: Endoscopy;  Laterality: N/A;   BILIARY STENT PLACEMENT N/A 03/27/2021   Procedure: BILIARY STENT PLACEMENT;  Surgeon: Jackquline Denmark, MD;  Location: [facility];  Service: Endoscopy;  Laterality: N/A;   BIOPSY  03/27/2021   Procedure: BIOPSY;  Surgeon: Jackquline Denmark, MD;  [facility]: [facility];  Service: Endoscopy;;   COLONOSCOPY N/A 02/16/2014   Procedure: COLONOSCOPY;  Surgeon: Gatha Mayer, MD;  [facility];  Service: Endoscopy;  Laterality: N/A;   COLONOSCOPY     ENDOSCOPIC RETROGRADE CHOLANGIOPANCREATOGRAPHY (ERCP) WITH PROPOFOL N/A 02/27/2021   Procedure:  ENDOSCOPIC RETROGRADE CHOLANGIOPANCREATOGRAPHY (ERCP) WITH PROPOFOL;  Surgeon: Milus Banister, MD;  [facility];  Service: Endoscopy;  Laterality: N/A;   ERCP N/A 03/27/2021   Procedure: ENDOSCOPIC RETROGRADE CHOLANGIOPANCREATOGRAPHY (ERCP);  Surgeon: Jackquline Denmark, MD;  Location: [facility];  Service:  Endoscopy;  Laterality: N/A;   ESOPHAGOGASTRODUODENOSCOPY (EGD) WITH PROPOFOL N/A 05/06/2021   Procedure: ESOPHAGOGASTRODUODENOSCOPY (EGD) WITH PROPOFOL;  Surgeon: Mauri Pole, MD;  [facility];  Service: Endoscopy;  Laterality: N/A;   EUS N/A 02/27/2021   Procedure: UPPER ENDOSCOPIC ULTRASOUND (EUS) RADIAL;  Surgeon: Milus Banister, MD;  [facility];  Service: Endoscopy;  Laterality: N/A;   FINE NEEDLE ASPIRATION N/A 02/27/2021   Procedure: FINE NEEDLE ASPIRATION (FNA) LINEAR;  Surgeon: Milus Banister, MD;  [facility];  Service: Endoscopy;  Laterality: N/A;   IR IMAGING GUIDED PORT INSERTION  03/21/2021   SPHINCTEROTOMY  02/27/2021   Procedure: SPHINCTEROTOMY;  Surgeon: Milus Banister, MD;  [facility];  Service: Endoscopy;;   STENT REMOVAL  03/27/2021   Procedure: STENT REMOVAL;  Surgeon: Jackquline Denmark, MD;  [facility];  Service: Endoscopy;;   TONSILLECTOMY      Social History:   reports that he has been smoking cigarettes. He has a 17.50 pack-year smoking history. He has never used smokeless tobacco. He reports that he does not currently use alcohol. He reports that he does not use drugs.  Allergies  Allergen Reactions   Bee Venom Anaphylaxis   Lactose Intolerance (Gi) Other (See Comments)    GI upset    Family History  Problem Relation Age of Onset   Breast cancer Mother    Stroke Father    Alcohol abuse Father    Heart disease Maternal Grandfather    Pancreatic cancer Paternal Grandmother    Diabetes Paternal Grandfather    Colon cancer Neg Hx    Stomach cancer Neg Hx    Rectal cancer Neg Hx    Esophageal cancer Neg Hx      Prior to Admission medications   Medication Sig Start Date End Date Taking? Authorizing Provider  acetaminophen (TYLENOL) 500 MG tablet Take 1 tablet by mouth every 6 hours as needed for moderate pain. Patient taking differently: Take 1,500 mg by mouth 2 (two) times daily as needed for  moderate pain. 03/23/22  Yes [name], NP  albuterol (VENTOLIN HFA) 108 (90 Base) MCG/ACT inhaler Inhale 2 puffs into the lungs every 6 hours as needed for wheezing or shortness of breath. 05/10/22  Yes Lincoln Brigham, PA-C  aspirin EC 81 MG tablet Take 1 tablet by mouth daily. 06/30/22  Yes Charlott Rakes, MD  atorvastatin (LIPITOR) 20 MG tablet Take 1 tablet by mouth daily. 05/07/22  Yes Charlott Rakes, MD  diphenoxylate-atropine (LOMOTIL) 2.5-0.025 MG tablet Take 1 tablet by mouth 4 times daily as needed for diarrhea or loose stools. 06/30/22  Yes [name], NP  DULoxetine (CYMBALTA) 60 MG capsule Take 1 capsule (60 mg total) by mouth daily. 02/17/22 07/07/22 Yes Newlin, Charlane Ferretti, MD  EPINEPHrine 0.3 mg/0.3 mL IJ SOAJ injection Inject 0.3 mg into the muscle once as needed for anaphylaxis. 05/07/22  Yes [name], NP  escitalopram (LEXAPRO) 10 MG tablet Take 1 tablet (10 mg total) by mouth daily. 05/06/22  Yes [name], NP  folic acid (FOLVITE) 1 MG tablet Take 1 tablet (1 mg total) by mouth in the morning. 03/28/21  Yes Rai, Ripudeep K, MD  insulin degludec (  TRESIBA FLEXTOUCH) 100 UNIT/ML FlexTouch Pen Inject 24 Units into the skin daily. Patient taking differently: Inject 7 Units into the skin daily. 01/21/22  Yes Philemon Kingdom, MD  Insulin Lispro-aabc (LYUMJEV KWIKPEN) 100 UNIT/ML KwikPen Inject 5-8 Units into the skin 3 (three) times daily before meals. Patient taking differently: Inject 3-5 Units into the skin 3 (three) times daily before meals. 01/21/22  Yes Philemon Kingdom, MD  lidocaine-prilocaine (EMLA) cream Apply topically as directed when needed. 05/10/22  Yes Lincoln Brigham, PA-C  lipase/protease/amylase (CREON) 36000 UNITS CPEP capsule Take 1 capsule (36,000 Units total) by mouth 3 (three) times daily with meals. May take 1 additional capsule with snacks Patient taking differently: Take 72,000 Units by mouth 3 (three) times daily with meals. 03/30/22  Yes  [name], NP  mirtazapine (REMERON) 30 MG tablet Take 1 tablet (30 mg total) by mouth at bedtime. 04/29/22 07/29/22 Yes Charlott Rakes, MD  Multiple Vitamins-Minerals (THEREMS-M) TABS Take 1 tablet by mouth in the morning. 07/04/21  Yes [name], NP  mupirocin ointment (BACTROBAN) 2 % Apply to wound surface with each dressing change. 02/04/22  Yes   ondansetron (ZOFRAN) 8 MG tablet Take 1 tablet (8 mg total) by mouth 2 (two) times daily as needed. Start on day 3 after chemotherapy. 05/10/22  Yes Dede Query T, PA-C  oxyCODONE (OXY IR/ROXICODONE) 5 MG immediate release tablet Take 1 - 2 tablets (5 - 10 mg total) by mouth every 6 hours as needed for severe pain. 06/30/22  Yes Orson Slick, MD  oxyCODONE ER [facility] ER) 9 MG C12A Take 1 capsule by mouth every 12 hours. 05/28/22  Yes Dede Query T, PA-C  pantoprazole (PROTONIX) 40 MG tablet Take 1 tablet by mouth daily. 05/07/22  Yes Charlott Rakes, MD  pregabalin (LYRICA) 75 MG capsule Take 75 mg by mouth daily. 05/22/22  Yes [provider]  prochlorperazine (COMPAZINE) 10 MG tablet Take 1 tablet (10 mg total) by mouth every 6 (six) hours as needed for nausea or vomiting. 05/10/22  Yes Dede Query T, PA-C  varenicline (APO-VARENICLINE) 1 MG tablet Take 1 tablet by mouth once daily in the morning with food 03/24/22  Yes   zolpidem (AMBIEN) 10 MG tablet Take 1 tablet (10 mg total) by mouth at bedtime as needed for sleep. 05/27/22 07/31/22 Yes [name], NP  Accu-Chek Softclix Lancets lancets Check blood glucose level by fingerstick 3-4 times per day. 07/04/21   Charlott Rakes, MD  Continuous Blood Gluc Sensor (FREESTYLE LIBRE 2 SENSOR) MISC Use 1 sensor every 2 weeks as advised 04/03/22   Philemon Kingdom, MD  glucose blood (ACCU-CHEK GUIDE) test strip Check blood glucose level by fingerstick 3 - 4 times per day. 07/04/21   Charlott Rakes, MD  Insulin Pen Needle (TRUEPLUS PEN NEEDLES) 32G X 4 MM MISC Use as instructed.  Inject into the skin twice daily 03/04/21   [name], NP  nicotine (NICODERM CQ - DOSED IN MG/24 HOURS) 21 mg/24hr patch Apply one patch daily for 12 weeks. Patient not taking: Reported on 07/07/2022 04/14/22       Physical Exam: Vitals:   07/07/22 2030 07/07/22 2112 07/08/22 0101 07/08/22 0220  BP: (!) 156/89   139/76  Pulse: (!) 114   (!) 103  Resp: 18   18  Temp:  98.1 F (36.7 C) 99.6 F (37.6 C)   TempSrc:  Oral Oral   SpO2: 96%   96%  Weight:  Height:        Constitutional: NAD, cachectic   Eyes: PERTLA, lids and conjunctivae normal ENMT: Mucous membranes are moist. Posterior pharynx clear of any exudate or lesions.   Neck: supple, no masses  Respiratory: no wheezing, no crackles. No accessory muscle use.  Cardiovascular: Rate ~100 and regular. No extremity edema.   Abdomen: soft, non-distended, tender in epigastrium and LLQ without rebound pain or guarding. Bowel sounds active.  Musculoskeletal: no clubbing / cyanosis. S/p left TMA.   Skin: no significant rashes, lesions, ulcers. Warm, dry, well-perfused. Neurologic: CN 2-12 grossly intact. Moving all extremities. Alert and oriented.  Psychiatric: Calm. Cooperative.    Labs and Imaging on Admission: I have personally reviewed following labs and imaging studies  CBC: Recent Labs  Lab 07/07/22 1757  WBC 8.3  NEUTROABS 6.2  HGB 8.8*  HCT 26.6*  MCV 98.5  PLT 98*   Basic Metabolic Panel: Recent Labs  Lab 07/07/22 1757  NA 132*  K 3.0*  CL 101  CO2 24  GLUCOSE 289*  BUN 6  CREATININE 0.52*  CALCIUM 7.2*   GFR: Estimated Creatinine Clearance: 75 mL/min (A) (by C-G formula based on SCr of 0.52 mg/dL (L)). Liver Function Tests: Recent Labs  Lab 07/07/22 1757  AST 59*  ALT 63*  ALKPHOS 234*  BILITOT 2.0*  PROT 5.5*  ALBUMIN 1.9*   Recent Labs  Lab 07/07/22 1757  LIPASE 20   No results for input(s): "AMMONIA" in the last 168 hours. Coagulation Profile: No results for input(s): "INR",  "PROTIME" in the last 168 hours. Cardiac Enzymes: No results for input(s): "CKTOTAL", "CKMB", "CKMBINDEX", "TROPONINI" in the last 168 hours. BNP (last 3 results) No results for input(s): "PROBNP" in the last 8760 hours. HbA1C: No results for input(s): "HGBA1C" in the last 72 hours. CBG: No results for input(s): "GLUCAP" in the last 168 hours. Lipid Profile: No results for input(s): "CHOL", "HDL", "LDLCALC", "TRIG", "CHOLHDL", "LDLDIRECT" in the last 72 hours. Thyroid Function Tests: No results for input(s): "TSH", "T4TOTAL", "FREET4", "T3FREE", "THYROIDAB" in the last 72 hours. Anemia Panel: No results for input(s): "VITAMINB12", "FOLATE", "FERRITIN", "TIBC", "IRON", "RETICCTPCT" in the last 72 hours. Urine analysis:    Component Value Date/Time   COLORURINE YELLOW 07/07/2022 1804   APPEARANCEUR CLEAR 07/07/2022 1804   APPEARANCEUR Clear 05/06/2022 1023   LABSPEC 1.003 (L) 07/07/2022 1804   PHURINE 6.0 07/07/2022 1804   GLUCOSEU 150 (A) 07/07/2022 1804   HGBUR NEGATIVE 07/07/2022 1804   BILIRUBINUR NEGATIVE 07/07/2022 1804   BILIRUBINUR Negative 05/06/2022 1023   KETONESUR NEGATIVE 07/07/2022 1804   PROTEINUR NEGATIVE 07/07/2022 1804   UROBILINOGEN 0.2 08/03/2017 0959   UROBILINOGEN 0.2 09/28/2013 1520   NITRITE NEGATIVE 07/07/2022 1804   LEUKOCYTESUR NEGATIVE 07/07/2022 1804   Sepsis Labs: '@LABRCNTIP'$ (procalcitonin:4,lacticidven:4) )No results found for this or any previous visit (from the past 240 hour(s)).   Radiological Exams on Admission: CT ABDOMEN PELVIS W CONTRAST  Result Date: 07/07/2022 CLINICAL DATA:  Acute abdominal pain. History of pancreatic cancer. EXAM: CT ABDOMEN AND PELVIS WITH CONTRAST TECHNIQUE: Multidetector CT imaging of the abdomen and pelvis was performed using the standard protocol following bolus administration of intravenous contrast. RADIATION DOSE REDUCTION: This exam was performed according to the departmental dose-optimization program which  includes automated exposure control, adjustment of the mA and/or kV according to patient size and/or use of iterative reconstruction technique. CONTRAST:  155m OMNIPAQUE IOHEXOL 350 MG/ML SOLN COMPARISON:  CT 01/13/2022 FINDINGS: Lower chest: Assessed on concurrent chest CT,  reported separately. Hepatobiliary: Lobulated 5.5 cm low-density lesion in the right lobe of the liver is new from May exam and suspicious for metastasis. Tiny hypodense focus centrally series 4, image 22 is at site of prior enhancement and likely a tiny hemangioma. Overall heterogeneous enhancement of the periphery of the right hepatic lobe, additional subtle lesions are not entirely excluded. The previous subcapsular fluid collection has resolved, however there is new moderate volume of ascites. Cholecystectomy. There is enhancement of the common bile duct which is nondilated. Pancreas: Partial pancreatectomy. The in situ pancreas is atrophic. Pancreatic duct measures 5 mm, previously 4 mm. No evidence of recurrent pancreatic lesion. Spleen: Normal in size without focal abnormality. Adrenals/Urinary Tract: Normal adrenal glands. There is no hydronephrosis. No renal calculi. No evidence of focal renal abnormality. Partially distended urinary bladder, unremarkable for degree of distension. Stomach/Bowel: Small hiatal hernia. There is mild enhancement of the gastric mucosa. Partial gastrectomy and pancreatico duodenectomy. The gastrojejunal anastomosis is patent. Small bowel is diffusely fluid-filled with mild wall thickening and enhancement. No pattern of obstruction. The appendix is not confidently visualized. Small to moderate colonic stool burden. Sigmoid diverticulosis without diverticulitis. Vascular/Lymphatic: Moderate to advanced aortic atherosclerosis. No aortic aneurysm. The portal vein is patent. Splenic vein is patent. No mesenteric vein thrombosis. Small porta hepatis nodes are all subcentimeter and not enlarged by size criteria.  Reproductive: Prostate is unremarkable. Other: Moderate volume of ascites, new from May exam. There is no convincing omental thickening. No free air. Musculoskeletal: Lower lumbar facet hypertrophy. No focal bone lesion. IMPRESSION: 1. New 5.5 cm low-density lesion in the right lobe of the liver. In the setting of history prior pancreatic malignancy, metastasis is favored, however there is enhancement of the common bile duct which raises the possibility of ascending cholangitis and hepatic abscess. Additionally there is ill-defined heterogeneous enhancement in the periphery of the right hepatic lobe which is nonspecific. Recommend hepatic protocol MRI for further assessment. 2. Moderate volume of ascites, new from May exam. 3. Post partial pancreatectomy and without evidence of recurrent pancreatic lesion. 4. Fluid-filled small bowel with mild wall thickening and enhancement, suggesting diffuse enteritis. 5. Sigmoid diverticulosis without diverticulitis. Aortic Atherosclerosis (ICD10-I70.0). These results were called by telephone at the time of interpretation on 07/07/2022 at 11:10 pm to provider DAVID YAO , who verbally acknowledged these results. Electronically Signed   By: Keith Rake M.D.   On: 07/07/2022 23:10   CT Angio Chest PE W and/or Wo Contrast  Result Date: 07/07/2022 CLINICAL DATA:  Pulmonary embolism (PE) suspected, high prob Patient with history of pancreatic cancer. EXAM: CT ANGIOGRAPHY CHEST WITH CONTRAST TECHNIQUE: Multidetector CT imaging of the chest was performed using the standard protocol during bolus administration of intravenous contrast. Multiplanar CT image reconstructions and MIPs were obtained to evaluate the vascular anatomy. RADIATION DOSE REDUCTION: This exam was performed according to the departmental dose-optimization program which includes automated exposure control, adjustment of the mA and/or kV according to patient size and/or use of iterative reconstruction technique.  CONTRAST:  156m OMNIPAQUE IOHEXOL 350 MG/ML SOLN COMPARISON:  Noncontrast chest CT 06/30/2022 FINDINGS: Cardiovascular: There are no filling defects within the pulmonary arteries to suggest pulmonary embolus. The heart is normal in size. There is atherosclerosis of the thoracic aorta without acute aortic findings. Right chest port tip is in the SVC. No pericardial effusion. Mediastinum/Nodes: Similar retrosternal stranding related to subacute sternal fracture, no enlarging mediastinal hematoma. No enlarged mediastinal or hilar lymph nodes. Small hiatal hernia. No suspicious thyroid nodule.  Lungs/Pleura: Emphysema. Small bilateral pleural effusions are new from recent CT. The 5 mm left lower lobe nodule series 4, image 106, is unchanged but not well-defined on the current exam due to adjacent atelectasis. Overall increase in streaky subsegmental atelectasis in the lingula and lower lobes from prior. No pneumothorax. Minimal retained mucus in the upper trachea. Upper Abdomen: Assessed on concurrent abdominopelvic CT, reported separately. Musculoskeletal: Unchanged alignment of displaced sternal fracture. No new osseous findings. Review of the MIP images confirms the above findings. IMPRESSION: 1. No pulmonary embolus. 2. Small bilateral pleural effusions are new from recent CT. Overall increase in streaky subsegmental atelectasis in the lingula and lower lobes from prior. 3. Unchanged alignment of subacute sternal fracture. 4. Unchanged 5 mm left lower lobe pulmonary nodule. Follow-up recommended per oncologic protocol. Aortic Atherosclerosis (ICD10-I70.0) and Emphysema (ICD10-J43.9). Electronically Signed   By: Keith Rake M.D.   On: 07/07/2022 23:00   CT ABDOMEN PELVIS W CONTRAST  Result Date: 07/07/2022 CLINICAL DATA:  Acute abdominal pain. Patient with history of pancreatic cancer. EXAM: CT ABDOMEN AND PELVIS WITH CONTRAST TECHNIQUE: Multidetector CT imaging of the abdomen and pelvis was performed using  the standard protocol following bolus administration of intravenous contrast. RADIATION DOSE REDUCTION: This exam was performed according to the departmental dose-optimization program which includes automated exposure control, adjustment of the mA and/or kV according to patient size and/or use of iterative reconstruction technique. CONTRAST:  168m OMNIPAQUE IOHEXOL 350 MG/ML SOLN COMPARISON:  Abdominal CT 01/13/2022 FINDINGS: Lower chest: Assessed on concurrent chest CT, reported separately. Hepatobiliary: The previous small subcapsular collection is not definitively seen. There is a 2.5 cm low-density lesion in the right hepatic lobe that is in the region of prior subcapsular collection. Partially distended gallbladder, question small gallstones. No pericholecystic inflammation. There is no biliary dilatation Pancreas: Pancreatico duodenectomy. No inflammation of the in situ pancreas. No ductal dilatation. Spleen: There is diffuse heterogeneous enhancement of the spleen. Spleen is normal in size. Adrenals/Urinary Tract: 18 mm right adrenal nodule, series 3, image 28. This is new from prior exam. No left adrenal gland. No hydronephrosis. No evidence of renal lesion or stone. There is absent renal excretion on delayed phase imaging. Partially distended urinary bladder. Mild bladder wall thickening at the dome. Stomach/Bowel: The stomach is partially distended. There are occasional fluid-filled loops of small bowel in the central and lower abdomen, no associated wall thickening or inflammation. No obstruction. Normal appendix. Sigmoid colonic diverticulosis without diverticulitis. Vascular/Lymphatic: Moderate aortic atherosclerosis. The infrarenal aorta measures 3 cm. Mixed calcified and noncalcified atheromatous plaque throughout. The portal vein is patent. The splenic vein is patent. No evidence of mesenteric vein thrombosis. Elongated portal caval node measures 6 mm short axis, series 3, image 27. There is an 8 mm  peripancreatic node series 3, image 26. Reproductive: Central prostatic enhancement. Prostate is normal in size. Other: No ascites. No omental thickening. No free air. Musculoskeletal: There is the new lytic lesion within the left aspect of L4 vertebral body measuring at least 2.3 cm suspicious for metastasis. Pathologic fracture through the superior and inferior endplates. Mild T12 compression deformity is new with a lucency in the superior endplate, Schmorl's node versus metastasis. IMPRESSION: 1. Mild bladder wall thickening may represent cystitis. 2. New 18 mm right adrenal nodule, suspicious for metastatic disease. 3. New lytic lesion in the left aspect of L4 vertebral body measuring at least 2.3 cm suspicious for osseous metastasis. Pathologic fracture through the superior and inferior endplates. Mild T12 compression deformity  is new from prior exam with a lucency in the superior endplate, Schmorl's node versus metastasis. 4. New 2.5 cm low-density lesion in the right hepatic lobe. This is in the region of prior subcapsular collection, and may represent a metastasis versus new intrahepatic collection. This could be further assessed with MRI as clinically indicated. 5. Heterogeneous enhancement of the spleen, nonspecific. 6. Absent renal excretion on delayed phase imaging, can be seen with renal dysfunction. 7. Colonic diverticulosis without diverticulitis. Aortic Atherosclerosis (ICD10-I70.0). Electronically Signed   By: Keith Rake M.D.   On: 07/07/2022 20:48    EKG: Independently reviewed. Sinus or ectopic atrial tachycardia, rate 104.   Assessment/Plan   1. Liver lesion  - New 5.5 cm right lobe liver lesion noted on CT in ED, favored by radiology to reflect metastatic disease, but the CBD enhancement raises concern for possible cholangitis with abscess  - He was started on empiric antibiotics in ED  - Check MRI, continue antibiotic coverage for now    2. Enteritis   - C diff and GI pathogen  panel ordered from ED  - Hydrate with IVF, monitor/replace electrolytes    3. Pancreatic cancer  - Diagnosed June 2022, s/p FOLFIRINOX from August 2022 until January 2023, Whipple in February 2023, chemoradiation June to August 2023  - [facility] in September confirms metastatic disease to the liver  - Further treatment has been on hold d/t the left foot infection    4. Anemia; thrombocytopenia  - Hgb is 8.8 and platelets 98,000 on admission  - No overt bleeding - Monitor, transfuse if needed    5. Sternal fracture  - He fell on 10/31 and suffered sternal body fracture  - Appears unchanged on CT in ED  - Continue pain-control    6. Left foot osteomyelitis  - Completed 6 weeks of vancomycin and Levaquin on 07/01/22  - Continue podiatry and ID follow-up as planned   - Sutures are due to be removed   7. Insulin-dependent DM  - A1c was 8.7% in August 2023  - Continue CBG checks and insulin    8. Alcohol abuse  - Monitor with CIWA, use Ativan as-needed, and supplement vitamins    9. Debility  - Pt complains of progressive general weakness and now has difficulty transferring to/from wheelchair  - Consult PT    DVT prophylaxis: SCDs Code Status: DNR  Level of Care: Level of care: Med-Surg Family Communication: Sister by phone  Disposition Plan:  Patient is from: Home  Anticipated d/c is to: TBD Anticipated d/c date is: Possibly as early as 11/8 or 07/09/22  Patient currently: Pending MRI abdomen  Consults called: none  Admission status: Observation     Vianne Bulls, MD [facility]  07/08/2022, 4:23 AM

## 2022-07-08 NOTE — Progress Notes (Signed)
  PT Cancellation Note  Patient Details Name: [name] [name] MRN: [id number] DOB: 10/09/1960   Cancelled Treatment:    Reason Eval/Treat Not Completed: Medical issues which prohibited therapy, noted that Palliative  medicine is consulted, will check back another time. RN not aware of  planned DC today. [place] Office [phone] Weekend [phone]    Claretha Cooper 07/08/2022, 1:26 PM

## 2022-07-08 NOTE — Consult Note (Incomplete)
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 His alcohol use disorder appears to have been treated multiple in the past including residential rehab, but he has never been able to sustain remission. He has been living alone and recently sustained a fall and broke his sternum.  Reviewed palliative care notes from home based [facility] team and our oncology palliative clinic and it is clear that patient can no longer live alone and is unsafe to return to his apartment in his current condition. Noted discussion about hospice care, but he was "not ready" for that per note.   Patient was seen in the ED. He appeared pale and was extremely sedated. He could be aroused with moderate verbal and tactile stimulation but would quickly fall back asleep. Review of the [facility] indicated that he got '2mg'$  of IV Ativan about 2 hours prior to my arrival at bedside. He was placed on the CIWA/Benzo protocol. He was too sedated to participate in a goals of care discussion and was unable to maintain focus for meaningful conversation.  Active Issues and Problems: Metastatic  Pancreatic Cancer-now in liver Osteomyelitis, foot w/partial amputation Sternal Fracture, frequent falls at home Abnormal LFTs Thrombocytopenia Anemia New Portal Vein Thrombosis Diabetes w hyperglycemia, poorly controlled Alcohol Use Disorder, Relapsed BAC 106 Gastritis and Abnormal    Recommendations:  Maintain DNR Goals of care conversations have been ongoing and palliative care has been  following closely both in clinic and his home.Hospice has been introduced, but he had not accepted this recommendations last week. His cancer has progressed and with such severe active infection and alcohol use he would not be a good chemotherapy candidate.

## 2022-07-08 NOTE — ED Notes (Signed)
CBG on pt 232 let the RN know

## 2022-07-08 NOTE — ED Notes (Signed)
10 sutures removed from surgical site on left foot. No bleeding noted.

## 2022-07-08 NOTE — Progress Notes (Signed)
Pharmacy: [facility]  Patient's a 61 y.o M with hx metastatic pancreatic cancer to the liver who presented to the ED on 07/08/22 with c/o diarrhea, generalized weakness and pain.  Abdominal MRI on 07/08/22 showed right portal vein thrombus.  He's currently on heparin drip for portal vein thrombosis.  - First heparin level is undetectable at <0.10 - Per pt's RN, no issues with IV line and no bleeding noted  Goal of Therapy:  Heparin level 0.3-0.7 units/ml Monitor platelets by anticoagulation protocol: Yes  Plan: - heparin 1500 units IV x1 bolus, then increase drip to 1200 units/hr - check 6 hr heparin level  - monitor for s/sx bleeding  Dia Sitter, PharmD, BCPS 07/08/2022 7:50 PM

## 2022-07-08 NOTE — Progress Notes (Signed)
[facility] Liaison note: ? ?This patient is currently enrolled in [facility] outpatient-based Palliative Care. Will continue to follow for disposition. ? ?Please call with any outpatient palliative questions or concerns. ? ?Thank you, ?Dee Curry, LPN ?[facility] Hospital Liaison ?[phone] ?

## 2022-07-08 NOTE — Progress Notes (Signed)
Pharmacy Antibiotic Note  [name] [name] is a 61 y.o. male admitted on 07/07/2022 with possible cholangitis with abscess on CT (vs metastatic disease).  Pharmacy has been consulted for Zosyn dosing.  In the ED patient received Cefepime 2gm IV and Metronidazole '500mg'$  IV x 1 dose each.  Plan: Zosyn 3.375g IV q8h (4 hour infusion). Need for further dosage adjustment appears unlikely at present.    Will sign off at this time.  Please reconsult if a change in clinical status warrants re-evaluation of dosage.    Height: '5\' 11"'$  (180.3 cm) Weight: 54 kg (119 lb 0.8 oz) IBW/kg (Calculated) : 75.3  Temp (24hrs), Avg:98.8 F (37.1 C), Min:98.1 F (36.7 C), Max:99.6 F (37.6 C)  Recent Labs  Lab 07/07/22 1757  WBC 8.3  CREATININE 0.52*    Estimated Creatinine Clearance: 75 mL/min (A) (by C-G formula based on SCr of 0.52 mg/dL (L)).    Allergies  Allergen Reactions   Bee Venom Anaphylaxis   Lactose Intolerance (Gi) Other (See Comments)    GI upset    Thank you for allowing pharmacy to be a part of this patient's care.  Everette Rank, PharmD 07/08/2022 4:27 AM

## 2022-07-08 NOTE — Consult Note (Signed)
Palliative Care  Consultation Note Reason for Consult: Symptom Management, Goals of Care  61 yo man with metastatic pancreatic cancer s/p partial pancreatectomy,

## 2022-07-08 NOTE — Progress Notes (Signed)
ANTICOAGULATION CONSULT NOTE - Initial Consult  Pharmacy Consult for Heparin Indication:  VTE  Allergies  Allergen Reactions   Bee Venom Anaphylaxis   Lactose Intolerance (Gi) Other (See Comments)    GI upset    Patient Measurements: Height: _0  (180.3 cm) Weight: 54 kg (119 lb 0.8 oz) IBW/kg (Calculated) : 75.3 Heparin Dosing Weight: TBW  Vital Signs: Temp: 98.8 F (37.1 C) (11/08 0806) Temp Source: Oral (11/08 0806) BP: 118/79 (11/08 0806) Pulse Rate: 79 (11/08 0806)  Labs: Recent Labs    07/07/22 1757 07/08/22 0542  HGB 8.8* 7.9*  HCT 26.6* 23.2*  PLT 98* 79*  CREATININE 0.52* 0.39*    Estimated Creatinine Clearance: 75 mL/min (A) (by C-G formula based on SCr of 0.39 mg/dL (L)).   Medical History: Past Medical History:  Diagnosis Date   Barrett's esophagus dx 2016   Bronchitis    Chronic hepatitis C without hepatic coma ([place]) 10/31/2014   Depression    Diabetes ([place])    ED (erectile dysfunction)    GERD (gastroesophageal reflux disease)    Hepatitis C    Hiatal hernia 10/2014   3cm   Jaundice 02/26/2021   Neuropathy    pancreatic ca 12/2020   Pneumonia 05/14/2020   S/P transmetatarsal amputation of foot, left ([place]) 11/28/2020   Sepsis ([place]) 10/28/2020    Medications:  Infusions:   0.9 % NaCl with KCl 20 mEq / L 75 mL/hr at 07/08/22 0538   magnesium sulfate bolus IVPB     piperacillin-tazobactam (ZOSYN)  IV 3.375 g (07/08/22 0826)   potassium PHOSPHATE IVPB (in mmol)      Assessment: 89 yoM admitted with uncontrolled pain, diarrhea, general weakness.  CT concerning for possible cholangitis/abscess vs metastatic disease.  PMH also significant for pancreatic cancer s/p Whipple, concerning for liver metastasis, Barrett esophagus, chronic hepatitis C, alcohol abuse, LLE osteomyelitis, recent sternal/rib fractures.  Pharmacy is consulted to dose Heparin for MRI + right portal vein thrombosis. No prior to admission anticoagulation.  Heparin was  previously ordered for this patient, but was d/c (and never started) due to CT results entered on wrong patient.    Baseline coags: INR elevated at 1.6, APTT 39, HL < 0.1 LFTs:  AST/ALT 37/48, Alk Phos 198, Tbili 2 CBC: Hgb 7.9 (decreased to below baseline ~ 10), Plt 79 (baseline ~ 170s) SCr 0.39  Goal of Therapy:  Heparin level 0.3-0.7 units/ml Monitor platelets by anticoagulation protocol: Yes   Plan:  Give heparin 1700 units bolus IV x 1 Start heparin IV infusion at 1000 units/hr Heparin level 6 hours after starting Daily heparin level and CBC   Gretta Arab PharmD, BCPS [facility] main pharmacy [phone] 07/08/2022 9:58 AM

## 2022-07-08 NOTE — Progress Notes (Signed)
PROGRESS NOTE    [name] [name]  IDH:[id number] DOB: 02/25/1961 DOA: 07/07/2022 PCP: [name], NP    Chief Complaint  Patient presents with   Weakness    Brief Narrative:  HPI per Dr. Clinton Quant is a 61 y.o. male with medical history significant for type 2 diabetes mellitus, Barrett esophagus, chronic hepatitis C, alcohol abuse, poorly healing left TMA with osteomyelitis, recent sternal body fracture, and pancreatic cancer status post Whipple with imaging at [facility] in September concerning for liver metastasis, now presenting to the emergency department with uncontrolled pain, generalized weakness and debility, and diarrhea.   Patient reports that he has been having approximately 4 loose stools every day and has had difficulty controlling his sternal and abdominal pain with his home medications.  He also complains of progressive general weakness, now with great difficulty transferring to and from his wheelchair.  He has not noted any subjective fever or chills.    ED Course: Upon arrival to the ED, patient is found to be afebrile and saturating well on room air with mild tachycardia and stable blood pressure.  He is in sinus or ectopic atrial tachycardia with rate 104 on EKG.  CTA chest is negative for PE but notable for small bilateral pleural effusions and unchanged sternal fracture and pulmonary nodule.  CT of the abdomen pelvis is notable for new 5.5 cm right liver lobe lesion which is likely metastatic but CBD enhancement is concerning for cholangitis with abscess.  Labs notable for glucose 289, potassium 3.0, alkaline phosphatase 234, albumin 1.9, AST 59, AST 63, total bilirubin 2.0, hemoglobin 8.8, and platelets 98,000.   Patient was treated with Dilaudid, Zofran, oral potassium, and a liter of normal saline in the ED.    Assessment & Plan:   Principal Problem:   Liver lesion, right lobe Active Problems:   Chronic hepatitis C without hepatic coma (HCC)   Type 2  diabetes mellitus with other specified complication (HCC)   ETOH abuse   Essential hypertension   Hypokalemia   Pancreatic cancer (HCC)   Wound of foot   Closed fracture of body of sternum   Enteritis   Thrombocytopenia (HCC)   Normocytic anemia   Hypomagnesemia   Portal vein thrombosis   Diabetes mellitus (HCC)   Anemia   Pancreatic cancer metastasized to liver ([place])  #1 liver lesion -New 5.5 cm right lobe liver lesion noted on CT favored by radiologist to reflect metastatic disease but CBD enhancement raises concern for possible cholangitis versus abscess. -Patient started empirically on IV antibiotics currently on IV Zosyn. -MRI abdomen done with a 6.1 x 5.1 cm irregular heterogeneous enhancing mass lesion in the dome of the posterior right liver compatible with metastatic disease.  Appears to be thrombus in the right portal vein branch inferior to the lesion.  Additionally 1.6 cm and 0.7 cm hepatic lesions restricted diffusion and also suspicious for metastatic disease.  Sequela of Whipple procedure.  Small to moderate volume ascites with diffuse mesenteric and soft tissue edema.  Small portacaval and retrocaval lymph nodes evident. -Discontinue IV antibiotics. -Patient's primary oncologist, Dr. Lorenso Courier informed of admission via epic. -We will need oncology's input to metastatic disease. -Palliative care consult for goals of care, symptom management.  2.  Thrombus within right portal vein branch inferior to the lesion -Patient hypercoagulable state secondary to metastatic cancer. -Place IV heparin pending oncology evaluation.  3.  Enteritis -Patient states has been having soft mushy stools. -Patient with electrolyte abnormalities. -  C. difficile PCR, GI pathogen panel pending. -Continue pancreatic enzymes.  4.  Pancreatic cancer -Diagnosed June 2022, status post FOLFIRINOX from August 2022-January 2023, status post Whipple February 2023, chemoradiation June-August 2023. -CT at  [facility] in September confirms metastatic disease to the liver. -Further treatment on hold due to left foot infection. -We will inform oncology of patient's admission. -Creon.  5.  Anemia/thrombocytopenia -Patient with no overt bleeding.  Hemoglobin currently at 7.9 this morning from 8.8 on admission.  Platelet count of 79 from 98 on admission. -Patient on IV heparin for thrombus within the right portal vein branch monitor closely. -Hematology/oncology informed of admission via epic.  6.  Sternal fracture -Patient noted to have had a fall 2010/31 and suffered a sternal body fracture. -Sternal fracture unchanged.  CT. -Continue pain management.  7.  Left foot osteomyelitis -Status post TMA. -Status post 6 weeks of vancomycin and Levaquin 07/01/2022. -Continue podiatry and ID follow-up in the outpatient settings. -Sutures to be removed.  8.  Hypokalemia/hypomagnesemia/hypophosphatemia -Likely secondary to metastatic disease. -K-Phos 30 mmol IV x1. -Magnesium sulfate 4 g IV x1 -K-Dur 40 mEq p.o. x1 -Repeat labs in the AM.  9.  Insulin-dependent diabetes mellitus -Hemoglobin A1c 8.06 April 2022. -Repeat hemoglobin A1c. -CBG 132 this morning. -SSI, continue home regimen Lyrica.  10.  Alcohol abuse -Patient with no overt withdrawal symptoms. -Continue the Ativan CIWA protocol, thiamine, folic acid, multivitamin  11.  Debility -Patient with complaints of progressive generalized weakness difficulty transferring to and from wheelchair. -PT/OT.  10.   DVT prophylaxis: Heparin Code Status: DNR Family Communication: Updated patient.  No family at bedside. Disposition: TBD  Status is: Inpatient The patient will require care spanning > 2 midnights and should be moved to inpatient because: Severity of illness   Consultants:  Palliative care pending Oncology informed via epic of admission: Dr. Lorenso Courier  Procedures:  CT abdomen and pelvis 07/07/2022 CT angiogram chest 07/07/2022 MRI  abdomen 07/08/2022  Antimicrobials:  IV cefepime 07/07/2022>>> 07/08/2022 IV Flagyl 07/07/2022>>>> 07/08/2022 IV Zosyn 07/08/2022>>>>>   Subjective: Laying on gurney.  Still with complaints of generalized weakness.  States he was having mostly soft multiple stools.  Endorses being on recent antibiotics.  Still with complaints of chronic left lower quadrant abdominal pain and sternal pain.  Objective: Vitals:   07/08/22 1120 07/08/22 1121 07/08/22 1430 07/08/22 1453  BP: 112/68 112/68 97/63   Pulse: 95 98 87   Resp: 16  14   Temp: 98.7 F (37.1 C)   98.5 F (36.9 C)  TempSrc: Oral   Oral  SpO2: 100%  100%   Weight:      Height:        Intake/Output Summary (Last 24 hours) at 07/08/2022 1620 Last data filed at 07/08/2022 1320 Gross per 24 hour  Intake 1448.98 ml  Output --  Net 1448.98 ml   Filed Weights   07/07/22 1711 07/07/22 1724  Weight: 54.4 kg 54 kg    Examination:  General exam: Appears calm and comfortable  Respiratory system: Clear to auscultation.  No wheezes, no crackles, no rhonchi.  Fair air movement.  Speaking in full sentences.  Respiratory effort normal. Cardiovascular system: S1 & S2 heard, RRR. No JVD, murmurs, rubs, gallops or clicks. No pedal edema. Gastrointestinal system: Abdomen is nondistended, soft and tender to palpation in the left lower quadrant.  Positive bowel sounds.  No rebound.  No guarding.  Central nervous system: Alert and oriented. No focal neurological deficits. Extremities: Status post left  transmetatarsal amputation with stitches in place, incision site c/d/I.  symmetric 5 x 5 power. Skin: No rashes, lesions or ulcers Psychiatry: Judgement and insight appear normal. Mood & affect appropriate.     Data Reviewed: I have personally reviewed following labs and imaging studies  CBC: Recent Labs  Lab 07/07/22 1757 07/08/22 0542  WBC 8.3 6.9  NEUTROABS 6.2  --   HGB 8.8* 7.9*  HCT 26.6* 23.2*  MCV 98.5 96.7  PLT 98* 79*     Basic Metabolic Panel: Recent Labs  Lab 07/07/22 1757 07/08/22 0542  NA 132* 135  K 3.0* 3.4*  CL 101 106  CO2 24 26  GLUCOSE 289* 220*  BUN 6 <5*  CREATININE 0.52* 0.39*  CALCIUM 7.2* 7.1*  MG  --  1.5*  PHOS  --  1.9*    GFR: Estimated Creatinine Clearance: 75 mL/min (A) (by C-G formula based on SCr of 0.39 mg/dL (L)).  Liver Function Tests: Recent Labs  Lab 07/07/22 1757 07/08/22 0542  AST 59* 37  ALT 63* 48*  ALKPHOS 234* 198*  BILITOT 2.0* 2.0*  PROT 5.5* 4.6*  ALBUMIN 1.9* 1.6*    CBG: Recent Labs  Lab 07/08/22 0452 07/08/22 0756 07/08/22 1215 07/08/22 1607  GLUCAP 232* 132* 182* 213*     No results found for this or any previous visit (from the past 240 hour(s)).       Radiology Studies: MR Abdomen W or Wo Contrast  Result Date: 07/08/2022 CLINICAL DATA:  Liver mass on CT scan. History of Whipple procedure for pancreatic cancer. EXAM: MRI ABDOMEN WITHOUT AND WITH CONTRAST TECHNIQUE: Multiplanar multisequence MR imaging of the abdomen was performed both before and after the administration of intravenous contrast. CONTRAST:  5.34m GADAVIST GADOBUTROL 1 MMOL/ML IV SOLN COMPARISON:  CT scan 07/07/2022 FINDINGS: Lower chest: Unremarkable. Hepatobiliary: 6.1 x 5.1 cm irregular heterogeneously enhancing mass lesion is identified in the dome of the posterior right liver. There appears to be thrombus within a branch of the portal vein along the anteroinferior margin of this lesion (see delayed 90 second postcontrast image 33 of series 16 and coronal postcontrast image 33 of series 18). A second 1.6 cm lesion in the anterior right liver (axial T2 image 15 of series 5) also shows heterogeneous enhancement after IV contrast administration. Both of these lesions restrict diffusion. There is a third focus of restricted diffusion in the subcapsular aspect of the anterior left liver corresponding to a subtle T2 hyperintensity measuring 7 mm on image 16/5.  Cholecystectomy. No intrahepatic or extrahepatic biliary dilation. Pancreas: Status post pancreatic head resection. Fine detail of pancreatic parenchyma obscured by motion artifact. Spleen:  No splenomegaly. No focal mass lesion. Adrenals/Urinary Tract: No adrenal nodule or mass. Kidneys unremarkable. Stomach/Bowel: Surgical changes in the stomach compatible with reported history of Whipple procedure. Mild diffuse gaseous distention of small bowel and colon. Vascular/Lymphatic: No abdominal aortic aneurysm. Main portal vein is patent. Small portal caval and retrocaval lymph nodes evident. Other: Small to moderate volume ascites with diffuse mesenteric and soft tissue edema. Musculoskeletal: No focal suspicious marrow enhancement within the visualized bony anatomy. IMPRESSION: 1. 6.1 x 5.1 cm irregular heterogeneously enhancing mass lesion in the dome of the posterior right liver compatible with metastatic disease. There appears to be thrombus within a right portal vein branch inferior to this lesion. 2. Additional 1.6 cm and 0.7 cm hepatic lesions are strict diffusion and are also suspicious for metastatic disease. 3. Sequelae of Whipple procedure. 4. Small to  moderate volume ascites with diffuse mesenteric and soft tissue edema. 5. Small portal caval and retrocaval lymph nodes evident. Electronically Signed   By: Misty Stanley M.D.   On: 07/08/2022 07:45   CT ABDOMEN PELVIS W CONTRAST  Result Date: 07/07/2022 CLINICAL DATA:  Acute abdominal pain. History of pancreatic cancer. EXAM: CT ABDOMEN AND PELVIS WITH CONTRAST TECHNIQUE: Multidetector CT imaging of the abdomen and pelvis was performed using the standard protocol following bolus administration of intravenous contrast. RADIATION DOSE REDUCTION: This exam was performed according to the departmental dose-optimization program which includes automated exposure control, adjustment of the mA and/or kV according to patient size and/or use of iterative  reconstruction technique. CONTRAST:  155m OMNIPAQUE IOHEXOL 350 MG/ML SOLN COMPARISON:  CT 01/13/2022 FINDINGS: Lower chest: Assessed on concurrent chest CT, reported separately. Hepatobiliary: Lobulated 5.5 cm low-density lesion in the right lobe of the liver is new from May exam and suspicious for metastasis. Tiny hypodense focus centrally series 4, image 22 is at site of prior enhancement and likely a tiny hemangioma. Overall heterogeneous enhancement of the periphery of the right hepatic lobe, additional subtle lesions are not entirely excluded. The previous subcapsular fluid collection has resolved, however there is new moderate volume of ascites. Cholecystectomy. There is enhancement of the common bile duct which is nondilated. Pancreas: Partial pancreatectomy. The in situ pancreas is atrophic. Pancreatic duct measures 5 mm, previously 4 mm. No evidence of recurrent pancreatic lesion. Spleen: Normal in size without focal abnormality. Adrenals/Urinary Tract: Normal adrenal glands. There is no hydronephrosis. No renal calculi. No evidence of focal renal abnormality. Partially distended urinary bladder, unremarkable for degree of distension. Stomach/Bowel: Small hiatal hernia. There is mild enhancement of the gastric mucosa. Partial gastrectomy and pancreatico duodenectomy. The gastrojejunal anastomosis is patent. Small bowel is diffusely fluid-filled with mild wall thickening and enhancement. No pattern of obstruction. The appendix is not confidently visualized. Small to moderate colonic stool burden. Sigmoid diverticulosis without diverticulitis. Vascular/Lymphatic: Moderate to advanced aortic atherosclerosis. No aortic aneurysm. The portal vein is patent. Splenic vein is patent. No mesenteric vein thrombosis. Small porta hepatis nodes are all subcentimeter and not enlarged by size criteria. Reproductive: Prostate is unremarkable. Other: Moderate volume of ascites, new from May exam. There is no convincing  omental thickening. No free air. Musculoskeletal: Lower lumbar facet hypertrophy. No focal bone lesion. IMPRESSION: 1. New 5.5 cm low-density lesion in the right lobe of the liver. In the setting of history prior pancreatic malignancy, metastasis is favored, however there is enhancement of the common bile duct which raises the possibility of ascending cholangitis and hepatic abscess. Additionally there is ill-defined heterogeneous enhancement in the periphery of the right hepatic lobe which is nonspecific. Recommend hepatic protocol MRI for further assessment. 2. Moderate volume of ascites, new from May exam. 3. Post partial pancreatectomy and without evidence of recurrent pancreatic lesion. 4. Fluid-filled small bowel with mild wall thickening and enhancement, suggesting diffuse enteritis. 5. Sigmoid diverticulosis without diverticulitis. Aortic Atherosclerosis (ICD10-I70.0). These results were called by telephone at the time of interpretation on 07/07/2022 at 11:10 pm to provider DAVID YAO , who verbally acknowledged these results. Electronically Signed   By: MKeith RakeM.D.   On: 07/07/2022 23:10   CT Angio Chest PE W and/or Wo Contrast  Result Date: 07/07/2022 CLINICAL DATA:  Pulmonary embolism (PE) suspected, high prob Patient with history of pancreatic cancer. EXAM: CT ANGIOGRAPHY CHEST WITH CONTRAST TECHNIQUE: Multidetector CT imaging of the chest was performed using the standard protocol during  bolus administration of intravenous contrast. Multiplanar CT image reconstructions and MIPs were obtained to evaluate the vascular anatomy. RADIATION DOSE REDUCTION: This exam was performed according to the departmental dose-optimization program which includes automated exposure control, adjustment of the mA and/or kV according to patient size and/or use of iterative reconstruction technique. CONTRAST:  177m OMNIPAQUE IOHEXOL 350 MG/ML SOLN COMPARISON:  Noncontrast chest CT 06/30/2022 FINDINGS:  Cardiovascular: There are no filling defects within the pulmonary arteries to suggest pulmonary embolus. The heart is normal in size. There is atherosclerosis of the thoracic aorta without acute aortic findings. Right chest port tip is in the SVC. No pericardial effusion. Mediastinum/Nodes: Similar retrosternal stranding related to subacute sternal fracture, no enlarging mediastinal hematoma. No enlarged mediastinal or hilar lymph nodes. Small hiatal hernia. No suspicious thyroid nodule. Lungs/Pleura: Emphysema. Small bilateral pleural effusions are new from recent CT. The 5 mm left lower lobe nodule series 4, image 106, is unchanged but not well-defined on the current exam due to adjacent atelectasis. Overall increase in streaky subsegmental atelectasis in the lingula and lower lobes from prior. No pneumothorax. Minimal retained mucus in the upper trachea. Upper Abdomen: Assessed on concurrent abdominopelvic CT, reported separately. Musculoskeletal: Unchanged alignment of displaced sternal fracture. No new osseous findings. Review of the MIP images confirms the above findings. IMPRESSION: 1. No pulmonary embolus. 2. Small bilateral pleural effusions are new from recent CT. Overall increase in streaky subsegmental atelectasis in the lingula and lower lobes from prior. 3. Unchanged alignment of subacute sternal fracture. 4. Unchanged 5 mm left lower lobe pulmonary nodule. Follow-up recommended per oncologic protocol. Aortic Atherosclerosis (ICD10-I70.0) and Emphysema (ICD10-J43.9). Electronically Signed   By: MKeith RakeM.D.   On: 07/07/2022 23:00   CT ABDOMEN PELVIS W CONTRAST  Result Date: 07/07/2022 CLINICAL DATA:  Acute abdominal pain. Patient with history of pancreatic cancer. EXAM: CT ABDOMEN AND PELVIS WITH CONTRAST TECHNIQUE: Multidetector CT imaging of the abdomen and pelvis was performed using the standard protocol following bolus administration of intravenous contrast. RADIATION DOSE REDUCTION:  This exam was performed according to the departmental dose-optimization program which includes automated exposure control, adjustment of the mA and/or kV according to patient size and/or use of iterative reconstruction technique. CONTRAST:  1015mOMNIPAQUE IOHEXOL 350 MG/ML SOLN COMPARISON:  Abdominal CT 01/13/2022 FINDINGS: Lower chest: Assessed on concurrent chest CT, reported separately. Hepatobiliary: The previous small subcapsular collection is not definitively seen. There is a 2.5 cm low-density lesion in the right hepatic lobe that is in the region of prior subcapsular collection. Partially distended gallbladder, question small gallstones. No pericholecystic inflammation. There is no biliary dilatation Pancreas: Pancreatico duodenectomy. No inflammation of the in situ pancreas. No ductal dilatation. Spleen: There is diffuse heterogeneous enhancement of the spleen. Spleen is normal in size. Adrenals/Urinary Tract: 18 mm right adrenal nodule, series 3, image 28. This is new from prior exam. No left adrenal gland. No hydronephrosis. No evidence of renal lesion or stone. There is absent renal excretion on delayed phase imaging. Partially distended urinary bladder. Mild bladder wall thickening at the dome. Stomach/Bowel: The stomach is partially distended. There are occasional fluid-filled loops of small bowel in the central and lower abdomen, no associated wall thickening or inflammation. No obstruction. Normal appendix. Sigmoid colonic diverticulosis without diverticulitis. Vascular/Lymphatic: Moderate aortic atherosclerosis. The infrarenal aorta measures 3 cm. Mixed calcified and noncalcified atheromatous plaque throughout. The portal vein is patent. The splenic vein is patent. No evidence of mesenteric vein thrombosis. Elongated portal caval node measures 6 mm  short axis, series 3, image 27. There is an 8 mm peripancreatic node series 3, image 26. Reproductive: Central prostatic enhancement. Prostate is normal  in size. Other: No ascites. No omental thickening. No free air. Musculoskeletal: There is the new lytic lesion within the left aspect of L4 vertebral body measuring at least 2.3 cm suspicious for metastasis. Pathologic fracture through the superior and inferior endplates. Mild T12 compression deformity is new with a lucency in the superior endplate, Schmorl's node versus metastasis. IMPRESSION: 1. Mild bladder wall thickening may represent cystitis. 2. New 18 mm right adrenal nodule, suspicious for metastatic disease. 3. New lytic lesion in the left aspect of L4 vertebral body measuring at least 2.3 cm suspicious for osseous metastasis. Pathologic fracture through the superior and inferior endplates. Mild T12 compression deformity is new from prior exam with a lucency in the superior endplate, Schmorl's node versus metastasis. 4. New 2.5 cm low-density lesion in the right hepatic lobe. This is in the region of prior subcapsular collection, and may represent a metastasis versus new intrahepatic collection. This could be further assessed with MRI as clinically indicated. 5. Heterogeneous enhancement of the spleen, nonspecific. 6. Absent renal excretion on delayed phase imaging, can be seen with renal dysfunction. 7. Colonic diverticulosis without diverticulitis. Aortic Atherosclerosis (ICD10-I70.0). Electronically Signed   By: Keith Rake M.D.   On: 07/07/2022 20:48        Scheduled Meds:  aspirin EC  81 mg Oral Daily   atorvastatin  20 mg Oral Daily   escitalopram  10 mg Oral Daily   folic acid  1 mg Oral Daily   insulin aspart  0-6 Units Subcutaneous Q4H   lipase/protease/amylase  72,000 Units Oral TID WC   LORazepam  0-4 mg Intravenous Q6H   Followed by   Derrill Memo ON 07/10/2022] LORazepam  0-4 mg Intravenous Q12H   mirtazapine  30 mg Oral QHS   multivitamin with minerals  1 tablet Oral Daily   pantoprazole  40 mg Oral Daily   pregabalin  75 mg Oral Daily   thiamine  100 mg Oral Daily   Or    thiamine  100 mg Intravenous Daily   Continuous Infusions:  0.9 % NaCl with KCl 20 mEq / L 75 mL/hr at 07/08/22 1453   heparin 1,000 Units/hr (07/08/22 1143)   piperacillin-tazobactam (ZOSYN)  IV 3.375 g (07/08/22 1603)   potassium PHOSPHATE IVPB (in mmol) 30 mmol (07/08/22 1322)     LOS: 1 day    Time spent: 45 mins    Irine Seal, MD [facility]   To contact the attending provider between 7A-7P or the covering provider during after hours 7P-7A, please log into the web site [email or web address] and access using universal [place] password for that web site. If you do not have the password, please call the hospital operator.  07/08/2022, 4:20 PM

## 2022-07-09 ENCOUNTER — Ambulatory Visit: Payer: Medicaid Other | Admitting: Internal Medicine

## 2022-07-09 DIAGNOSIS — K769 Liver disease, unspecified: Secondary | ICD-10-CM | POA: Diagnosis not present

## 2022-07-09 DIAGNOSIS — I10 Essential (primary) hypertension: Secondary | ICD-10-CM

## 2022-07-09 DIAGNOSIS — C259 Malignant neoplasm of pancreas, unspecified: Secondary | ICD-10-CM | POA: Diagnosis not present

## 2022-07-09 DIAGNOSIS — B182 Chronic viral hepatitis C: Secondary | ICD-10-CM

## 2022-07-09 DIAGNOSIS — D696 Thrombocytopenia, unspecified: Secondary | ICD-10-CM | POA: Diagnosis not present

## 2022-07-09 LAB — GLUCOSE, CAPILLARY
Glucose-Capillary: 192 mg/dL — ABNORMAL HIGH (ref 70–99)
Glucose-Capillary: 152 mg/dL — ABNORMAL HIGH (ref 70–99)
Glucose-Capillary: 144 mg/dL — ABNORMAL HIGH (ref 70–99)
Glucose-Capillary: 228 mg/dL — ABNORMAL HIGH (ref 70–99)
Glucose-Capillary: 251 mg/dL — ABNORMAL HIGH (ref 70–99)
Glucose-Capillary: 312 mg/dL — ABNORMAL HIGH (ref 70–99)

## 2022-07-09 LAB — GASTROINTESTINAL PANEL BY PCR, STOOL (REPLACES STOOL CULTURE)

## 2022-07-09 LAB — COMPREHENSIVE METABOLIC PANEL
ALT: 37 U/L (ref 0–44)
AST: 35 U/L (ref 15–41)
Albumin: 1.5 g/dL — ABNORMAL LOW (ref 3.5–5.0)
Alkaline Phosphatase: 193 U/L — ABNORMAL HIGH (ref 38–126)
Anion gap: 3 — ABNORMAL LOW (ref 5–15)
BUN: 5 mg/dL — ABNORMAL LOW (ref 6–20)
CO2: 24 mmol/L (ref 22–32)
Calcium: 7.1 mg/dL — ABNORMAL LOW (ref 8.9–10.3)
Chloride: 105 mmol/L (ref 98–111)
Creatinine, Ser: 0.45 mg/dL — ABNORMAL LOW (ref 0.61–1.24)
GFR, Estimated: >60 mL/min (ref >60)
Glucose, Bld: 160 mg/dL — ABNORMAL HIGH (ref 70–99)
Potassium: 4 mmol/L (ref 3.5–5.1)
Sodium: 132 mmol/L — ABNORMAL LOW (ref 135–145)
Total Bilirubin: 1.4 mg/dL — ABNORMAL HIGH (ref 0.3–1.2)
Total Protein: 4.8 g/dL — ABNORMAL LOW (ref 6.5–8.1)

## 2022-07-09 LAB — C DIFFICILE QUICK SCREEN W PCR REFLEX
C Diff antigen: NEGATIVE
C Diff interpretation: NOT DETECTED
C Diff toxin: NEGATIVE

## 2022-07-09 LAB — HEPARIN LEVEL (UNFRACTIONATED)
Heparin Unfractionated: 0.17 IU/mL — ABNORMAL LOW (ref 0.30–0.70)
Heparin Unfractionated: <0.1 IU/mL — ABNORMAL LOW (ref 0.30–0.70)
Heparin Unfractionated: >1.1 IU/mL — ABNORMAL HIGH (ref 0.30–0.70)

## 2022-07-09 LAB — CBC
HCT: 23.6 % — ABNORMAL LOW (ref 39.0–52.0)
Hemoglobin: 7.8 g/dL — ABNORMAL LOW (ref 13.0–17.0)
MCH: 32.1 pg (ref 26.0–34.0)
MCHC: 33.1 g/dL (ref 30.0–36.0)
MCV: 97.1 fL (ref 80.0–100.0)
Platelets: 99 10*3/uL — ABNORMAL LOW (ref 150–400)
RBC: 2.43 MIL/uL — ABNORMAL LOW (ref 4.22–5.81)
RDW: 16.6 % — ABNORMAL HIGH (ref 11.5–15.5)
WBC: 7.7 10*3/uL (ref 4.0–10.5)
nRBC: 0 % (ref 0.0–0.2)

## 2022-07-09 LAB — PHOSPHORUS: Phosphorus: 2.2 mg/dL — ABNORMAL LOW (ref 2.5–4.6)

## 2022-07-09 LAB — MAGNESIUM: Magnesium: 1.8 mg/dL (ref 1.7–2.4)

## 2022-07-09 MED ORDER — NICOTINE 21 MG/24HR TD PT24
21.0000 mg | MEDICATED_PATCH | Freq: Every day | TRANSDERMAL | Status: DC
  Administered 2022-07-09 – 2022-07-17 (×9): 21 mg via TRANSDERMAL
  Filled 2022-07-09 (×9): qty 1

## 2022-07-09 MED ORDER — HEPARIN BOLUS VIA INFUSION
1500.0000 [IU] | Freq: Once | INTRAVENOUS | Status: AC
  Administered 2022-07-09: 1500 [IU] via INTRAVENOUS
  Filled 2022-07-09: qty 1500

## 2022-07-09 MED ORDER — K PHOS MONO-SOD PHOS DI & MONO 155-852-130 MG PO TABS
250.0000 mg | ORAL_TABLET | Freq: Two times a day (BID) | ORAL | Status: DC
  Administered 2022-07-09 – 2022-07-10 (×3): 250 mg via ORAL
  Filled 2022-07-09 (×3): qty 1

## 2022-07-09 MED ORDER — ALBUMIN HUMAN 25 % IV SOLN
25.0000 g | Freq: Four times a day (QID) | INTRAVENOUS | Status: AC
  Administered 2022-07-09 – 2022-07-10 (×4): 25 g via INTRAVENOUS
  Filled 2022-07-09 (×4): qty 100

## 2022-07-09 MED ORDER — ACAMPROSATE CALCIUM 333 MG PO TBEC
666.0000 mg | DELAYED_RELEASE_TABLET | Freq: Three times a day (TID) | ORAL | Status: DC
  Administered 2022-07-10 – 2022-07-17 (×23): 666 mg via ORAL
  Filled 2022-07-09 (×25): qty 2

## 2022-07-09 MED ORDER — MAGNESIUM SULFATE 2 GM/50ML IV SOLN
2.0000 g | Freq: Once | INTRAVENOUS | Status: AC
  Administered 2022-07-09: 2 g via INTRAVENOUS
  Filled 2022-07-09: qty 50

## 2022-07-09 MED ORDER — CHLORHEXIDINE GLUCONATE CLOTH 2 % EX PADS
6.0000 | MEDICATED_PAD | Freq: Every day | CUTANEOUS | Status: DC
  Administered 2022-07-09 – 2022-07-17 (×9): 6 via TOPICAL

## 2022-07-09 MED ORDER — HYDROMORPHONE HCL 1 MG/ML IJ SOLN
0.5000 mg | INTRAMUSCULAR | Status: DC | PRN
  Administered 2022-07-09 – 2022-07-16 (×43): 1 mg via INTRAVENOUS
  Filled 2022-07-09 (×43): qty 1

## 2022-07-09 MED ORDER — HEPARIN (PORCINE) 25000 UT/250ML-% IV SOLN
2250.0000 [IU]/h | INTRAVENOUS | Status: DC
  Administered 2022-07-09 (×2): 1800 [IU]/h via INTRAVENOUS
  Filled 2022-07-09 (×2): qty 250

## 2022-07-09 NOTE — Progress Notes (Signed)
ANTICOAGULATION CONSULT NOTE -    Pharmacy Consult for Heparin Indication:  VTE  Allergies  Allergen Reactions   Bee Venom Anaphylaxis   Lactose Intolerance (Gi) Other (See Comments)    GI upset    Patient Measurements: Height: _0  (180.3 cm) Weight: 54 kg (119 lb 0.8 oz) IBW/kg (Calculated) : 75.3 Heparin Dosing Weight: TBW  Vital Signs: Temp: 99 F (37.2 C) (11/09 0404) Temp Source: Oral (11/09 0404) BP: 114/74 (11/09 0404) Pulse Rate: 94 (11/09 0404)  Labs: Recent Labs    07/07/22 1757 07/08/22 0542 07/08/22 1023 07/08/22 1857 07/09/22 0448  HGB 8.8* 7.9*  --   --  7.8*  HCT 26.6* 23.2*  --   --  23.6*  PLT 98* 79*  --   --  99*  APTT  --   --  39*  --   --   LABPROT  --   --  19.2*  --   --   INR  --   --  1.6*  --   --   HEPARINUNFRC  --   --  <0.10* <0.10* 0.17*  CREATININE 0.52* 0.39*  --   --  0.45*     Estimated Creatinine Clearance: 75 mL/min (A) (by C-G formula based on SCr of 0.45 mg/dL (L)).   Medical History: Past Medical History:  Diagnosis Date   Barrett's esophagus dx 2016   Bronchitis    Chronic hepatitis C without hepatic coma ([place]) 10/31/2014   Depression    Diabetes ([place])    ED (erectile dysfunction)    GERD (gastroesophageal reflux disease)    Hepatitis C    Hiatal hernia 10/2014   3cm   Jaundice 02/26/2021   Neuropathy    pancreatic ca 12/2020   Pneumonia 05/14/2020   S/P transmetatarsal amputation of foot, left ([place]) 11/28/2020   Sepsis ([place]) 10/28/2020    Medications:  Infusions:   heparin 1,200 Units/hr (07/08/22 1953)    Assessment: 2 yoM admitted with uncontrolled pain, diarrhea, general weakness.  CT concerning for possible cholangitis/abscess vs metastatic disease.  PMH also significant for pancreatic cancer s/p Whipple, concerning for liver metastasis, Barrett esophagus, chronic hepatitis C, alcohol abuse, LLE osteomyelitis, recent sternal/rib fractures.  Pharmacy is consulted to dose Heparin for MRI + right  portal vein thrombosis. No prior to admission anticoagulation.  Heparin was previously ordered for this patient, but was d/c (and never started) due to CT results entered on wrong patient.    Baseline coags: INR elevated at 1.6, APTT 39, HL < 0.1 LFTs:  AST/ALT 37/48, Alk Phos 198, Tbili 2 CBC: Hgb 7.9 (decreased to below baseline ~ 10), Plt 79 (baseline ~ 170s) SCr 0.39  07/09/22 04:48 Heparin level = 0.17 (subtherapeutic) with heparin gtt @ 1200 units/hr Hgb = 7.8 (stable), Pltc 99K (stable) No bleeding complications and no interruption in therapy/line issues noted by RN  Goal of Therapy:  Heparin level 0.3-0.7 units/ml Monitor platelets by anticoagulation protocol: Yes   Plan:  Give heparin 1500 units bolus IV x 1 Increase heparin IV infusion to 1400 units/hr Heparin level 6 hours after rate increase Daily heparin level and CBC   Leone Haven, PharmD 07/09/2022 5:33 AM

## 2022-07-09 NOTE — Progress Notes (Signed)
PROGRESS NOTE    [name] [name]  FIE:[id number] DOB: [birth year] DOA: 07/07/2022 PCP: [name], NP    Chief Complaint  Patient presents with   Weakness    Brief Narrative:  HPI per Dr. Clinton Quant is a 61 y.o. male with medical history significant for type 2 diabetes mellitus, Barrett esophagus, chronic hepatitis C, alcohol abuse, poorly healing left TMA with osteomyelitis, recent sternal body fracture, and pancreatic cancer status post Whipple with imaging at [facility] in September concerning for liver metastasis, now presenting to the emergency department with uncontrolled pain, generalized weakness and debility, and diarrhea.   Patient reports that he has been having approximately 4 loose stools every day and has had difficulty controlling his sternal and abdominal pain with his home medications.  He also complains of progressive general weakness, now with great difficulty transferring to and from his wheelchair.  He has not noted any subjective fever or chills.    ED Course: Upon arrival to the ED, patient is found to be afebrile and saturating well on room air with mild tachycardia and stable blood pressure.  He is in sinus or ectopic atrial tachycardia with rate 104 on EKG.  CTA chest is negative for PE but notable for small bilateral pleural effusions and unchanged sternal fracture and pulmonary nodule.  CT of the abdomen pelvis is notable for new 5.5 cm right liver lobe lesion which is likely metastatic but CBD enhancement is concerning for cholangitis with abscess.  Labs notable for glucose 289, potassium 3.0, alkaline phosphatase 234, albumin 1.9, AST 59, AST 63, total bilirubin 2.0, hemoglobin 8.8, and platelets 98,000.   Patient was treated with Dilaudid, Zofran, oral potassium, and a liter of normal saline in the ED.    Assessment & Plan:   Principal Problem:   Liver lesion, right lobe Active Problems:   Chronic hepatitis C without hepatic coma (HCC)   Type 2  diabetes mellitus with other specified complication (HCC)   ETOH abuse   Essential hypertension   Hypokalemia   Pancreatic cancer (HCC)   Wound of foot   Closed fracture of body of sternum   Enteritis   Thrombocytopenia (HCC)   Normocytic anemia   Hypomagnesemia   Portal vein thrombosis   Diabetes mellitus (HCC)   Anemia   Pancreatic cancer metastasized to liver ([place])  #1 liver lesion -New 5.5 cm right lobe liver lesion noted on CT favored by radiologist to reflect metastatic disease but CBD enhancement raises concern for possible cholangitis versus abscess. -Patient started empirically on IV antibiotics currently on IV Zosyn. -MRI abdomen done with a 6.1 x 5.1 cm irregular heterogeneous enhancing mass lesion in the dome of the posterior right liver compatible with metastatic disease.  Appears to be thrombus in the right portal vein branch inferior to the lesion.  Additionally 1.6 cm and 0.7 cm hepatic lesions restricted diffusion and also suspicious for metastatic disease.  Sequela of Whipple procedure.  Small to moderate volume ascites with diffuse mesenteric and soft tissue edema.  Small portacaval and retrocaval lymph nodes evident. -Discontinued IV antibiotics. -Patient's primary oncologist, Dr. Lorenso Courier informed of admission via epic. -Oncology input pending.  -Palliative care consulted for goals of care, symptom management.  2.  Thrombus within right portal vein branch inferior to the lesion -Patient hypercoagulable state secondary to metastatic cancer. -Continue IV heparin pending oncology evaluation.  3.  Enteritis -Patient states has been having soft mushy stools. -Patient with electrolyte abnormalities. -C. difficile PCR negative. -  GI pathogen panel negative. -Continue pancreatic enzymes.  4.  Pancreatic cancer -Diagnosed June 2022, status post FOLFIRINOX from August 2022-January 2023, status post Whipple February 2023, chemoradiation June-August 2023. -CT at [facility] in  September confirms metastatic disease to the liver. -Further treatment on hold due to left foot infection. -Oncology informed of admission and will be assessing the patient.  -Continue pancreatic enzymes/Creon.   5.  Anemia/thrombocytopenia -Patient with no overt bleeding.  Hemoglobin currently at 7.8 from 7.9 from 8.8 on admission.  Platelet count of 99 from 79 from 98 on admission. -Patient on IV heparin for thrombus within the right portal vein branch monitor closely. -Hematology/oncology informed of admission via epic and will be assessing the patient..  6.  Sternal fracture -Patient noted to have had a fall 2010/31 and suffered a sternal body fracture. -Sternal fracture unchanged.  CT. -Continue pain management.  7.  Left foot osteomyelitis -Status post TMA. -Status post 6 weeks of vancomycin and Levaquin 07/01/2022. -Continue podiatry and ID follow-up in the outpatient settings. -Sutures removed.    8.  Hypokalemia/hypomagnesemia/hypophosphatemia -Likely secondary to metastatic disease. -Status post K-Phos 30 mmol IV x1. -Phosphorus at 2.2. -Placed on oral phosphorus tablets for the next 2 to 3 days. -Magnesium at 1.8. -Magnesium sulfate 2 g IV x1. -Potassium repleted currently at 4.0. -Repeat labs in the AM.  9.  Insulin-dependent diabetes mellitus -Hemoglobin A1c 8.06 April 2022. -Repeat hemoglobin A1c. -CBG 144 this morning. -SSI, continue home regimen Lyrica.  10.  Alcohol abuse -Patient with no overt withdrawal symptoms. -Continue the Ativan CIWA protocol, thiamine, folic acid, multivitamin  11.  Debility -Patient with complaints of progressive generalized weakness difficulty transferring to and from wheelchair. -PT/OT.    DVT prophylaxis: Heparin Code Status: DNR Family Communication: Updated patient.  No family at bedside. Disposition: TBD  Status is: Inpatient The patient will require care spanning > 2 midnights and should be moved to inpatient  because: Severity of illness, unsafe disposition.   Consultants:  Palliative care: Dr. Hilma Favors 07/08/2022 Oncology: Dr. Lorenso Courier 07/09/2022  Procedures:  CT abdomen and pelvis 07/07/2022 CT angiogram chest 07/07/2022 MRI abdomen 07/08/2022  Antimicrobials:  IV cefepime 07/07/2022>>> 07/08/2022 IV Flagyl 07/07/2022>>>> 07/08/2022 IV Zosyn 07/08/2022>>>>> 07/08/2022   Subjective: Sitting up in bed eating breakfast.  States still with generalized weakness.  Having soft multiple stools.  Still with chronic left lower quadrant abdominal pain.  Some improvement with sternal pain.  Overall feels a little bit better than on admission.    Objective: Vitals:   07/09/22 0110 07/09/22 0404 07/09/22 0733 07/09/22 1356  BP: 125/71 114/74 129/78 117/71  Pulse: (!) 102 94 97 93  Resp: '17 17 20 20  '$ Temp: 100.2 F (37.9 C) 99 F (37.2 C) 98.8 F (37.1 C) 99.2 F (37.3 C)  TempSrc: Oral Oral Oral Oral  SpO2: 94% 92% 94% 91%  Weight:  62.3 kg    Height:        Intake/Output Summary (Last 24 hours) at 07/09/2022 1700 Last data filed at 07/09/2022 1616 Gross per 24 hour  Intake 2258.54 ml  Output 375 ml  Net 1883.54 ml   Filed Weights   07/07/22 1711 07/07/22 1724 07/09/22 0404  Weight: 54.4 kg 54 kg 62.3 kg    Examination:  General exam: NAD Respiratory system: Lungs clear to auscultation bilaterally.  No wheezes, no crackles, no rhonchi.  Fair air movement.  Speaking in full sentences.  Normal respiratory effort.  Cardiovascular system: RRR no murmurs rubs or gallops.  No JVD.  No lower extremity edema.   Gastrointestinal system: Abdomen is nondistended, soft and tender to palpation in the left lower quadrant (chronic).  Positive bowel sounds.  No rebound.  No guarding.  Central nervous system: Alert and oriented. No focal neurological deficits. Extremities: Status post left transmetatarsal amputation incision site c/d/I.  symmetric 5 x 5 power. Skin: No rashes, lesions or ulcers Psychiatry:  Judgement and insight appear normal. Mood & affect appropriate.     Data Reviewed: I have personally reviewed following labs and imaging studies  CBC: Recent Labs  Lab 07/07/22 1757 07/08/22 0542 07/09/22 0448  WBC 8.3 6.9 7.7  NEUTROABS 6.2  --   --   HGB 8.8* 7.9* 7.8*  HCT 26.6* 23.2* 23.6*  MCV 98.5 96.7 97.1  PLT 98* 79* 99*    Basic Metabolic Panel: Recent Labs  Lab 07/07/22 1757 07/08/22 0542 07/09/22 0448  NA 132* 135 132*  K 3.0* 3.4* 4.0  CL 101 106 105  CO2 '24 26 24  '$ GLUCOSE 289* 220* 160*  BUN 6 <5* 5*  CREATININE 0.52* 0.39* 0.45*  CALCIUM 7.2* 7.1* 7.1*  MG  --  1.5* 1.8  PHOS  --  1.9* 2.2*    GFR: Estimated Creatinine Clearance: 86.5 mL/min (A) (by C-G formula based on SCr of 0.45 mg/dL (L)).  Liver Function Tests: Recent Labs  Lab 07/07/22 1757 07/08/22 0542 07/09/22 0448  AST 59* 37 35  ALT 63* 48* 37  ALKPHOS 234* 198* 193*  BILITOT 2.0* 2.0* 1.4*  PROT 5.5* 4.6* 4.8*  ALBUMIN 1.9* 1.6* 1.5*    CBG: Recent Labs  Lab 07/09/22 0106 07/09/22 0350 07/09/22 0729 07/09/22 1132 07/09/22 1600  GLUCAP 192* 152* 144* 228* 251*     Recent Results (from the past 240 hour(s))  Gastrointestinal Panel by PCR , Stool     Status: None   Collection Time: 07/08/22 10:28 PM   Specimen: Stool  Result Value Ref Range Status   Campylobacter species NOT DETECTED NOT DETECTED Final   Plesimonas shigelloides NOT DETECTED NOT DETECTED Final   Salmonella species NOT DETECTED NOT DETECTED Final   Yersinia enterocolitica NOT DETECTED NOT DETECTED Final   Vibrio species NOT DETECTED NOT DETECTED Final   Vibrio cholerae NOT DETECTED NOT DETECTED Final   Enteroaggregative E coli (EAEC) NOT DETECTED NOT DETECTED Final   Enteropathogenic E coli (EPEC) NOT DETECTED NOT DETECTED Final   Enterotoxigenic E coli (ETEC) NOT DETECTED NOT DETECTED Final   Shiga like toxin producing E coli (STEC) NOT DETECTED NOT DETECTED Final   Shigella/Enteroinvasive E coli  (EIEC) NOT DETECTED NOT DETECTED Final   Cryptosporidium NOT DETECTED NOT DETECTED Final   Cyclospora cayetanensis NOT DETECTED NOT DETECTED Final   Entamoeba histolytica NOT DETECTED NOT DETECTED Final   Giardia lamblia NOT DETECTED NOT DETECTED Final   Adenovirus F40/41 NOT DETECTED NOT DETECTED Final   Astrovirus NOT DETECTED NOT DETECTED Final   Norovirus GI/GII NOT DETECTED NOT DETECTED Final   Rotavirus A NOT DETECTED NOT DETECTED Final   Sapovirus (I, II, IV, and V) NOT DETECTED NOT DETECTED Final    Comment: Performed at [facility], [address] [place], [place] [address]  C Difficile Quick Screen w PCR reflex     Status: None   Collection Time: 07/08/22 10:28 PM   Specimen: Stool  Result Value Ref Range Status   C Diff antigen NEGATIVE NEGATIVE Final   C Diff toxin NEGATIVE NEGATIVE Final  C Diff interpretation No C. difficile detected.  Final    Comment: Performed at [facility], [place] [address], [place], [place] [address]         Radiology Studies: CT ABDOMEN PELVIS W CONTRAST  Addendum Date: 07/09/2022   ADDENDUM REPORT: 07/09/2022 12:22 ADDENDUM: This addendum is created to clarify a technical issue that occurred during this patient's radiologic imaging. Initially, a different patient was scanned under this patient's name and medical information and was associated with this exam. The report below for this exam is for the correct patient, and this was discussed with the emergency room physician at the time of this exam. Electronically Signed   By: Keith Rake M.D.   On: 07/09/2022 12:22   Result Date: 07/09/2022 CLINICAL DATA:  Acute abdominal pain. Patient with history of pancreatic cancer. EXAM: CT ABDOMEN AND PELVIS WITH CONTRAST TECHNIQUE: Multidetector CT imaging of the abdomen and pelvis was performed using the standard protocol following bolus administration of intravenous contrast. RADIATION DOSE REDUCTION: This exam was  performed according to the departmental dose-optimization program which includes automated exposure control, adjustment of the mA and/or kV according to patient size and/or use of iterative reconstruction technique. CONTRAST:  147m OMNIPAQUE IOHEXOL 350 MG/ML SOLN COMPARISON:  Abdominal CT 01/13/2022 FINDINGS: Lower chest: Assessed on concurrent chest CT, reported separately. Hepatobiliary: The previous small subcapsular collection is not definitively seen. There is a 2.5 cm low-density lesion in the right hepatic lobe that is in the region of prior subcapsular collection. Partially distended gallbladder, question small gallstones. No pericholecystic inflammation. There is no biliary dilatation Pancreas: Pancreatico duodenectomy. No inflammation of the in situ pancreas. No ductal dilatation. Spleen: There is diffuse heterogeneous enhancement of the spleen. Spleen is normal in size. Adrenals/Urinary Tract: 18 mm right adrenal nodule, series 3, image 28. This is new from prior exam. No left adrenal gland. No hydronephrosis. No evidence of renal lesion or stone. There is absent renal excretion on delayed phase imaging. Partially distended urinary bladder. Mild bladder wall thickening at the dome. Stomach/Bowel: The stomach is partially distended. There are occasional fluid-filled loops of small bowel in the central and lower abdomen, no associated wall thickening or inflammation. No obstruction. Normal appendix. Sigmoid colonic diverticulosis without diverticulitis. Vascular/Lymphatic: Moderate aortic atherosclerosis. The infrarenal aorta measures 3 cm. Mixed calcified and noncalcified atheromatous plaque throughout. The portal vein is patent. The splenic vein is patent. No evidence of mesenteric vein thrombosis. Elongated portal caval node measures 6 mm short axis, series 3, image 27. There is an 8 mm peripancreatic node series 3, image 26. Reproductive: Central prostatic enhancement. Prostate is normal in size.  Other: No ascites. No omental thickening. No free air. Musculoskeletal: There is the new lytic lesion within the left aspect of L4 vertebral body measuring at least 2.3 cm suspicious for metastasis. Pathologic fracture through the superior and inferior endplates. Mild T12 compression deformity is new with a lucency in the superior endplate, Schmorl's node versus metastasis. IMPRESSION: 1. Mild bladder wall thickening may represent cystitis. 2. New 18 mm right adrenal nodule, suspicious for metastatic disease. 3. New lytic lesion in the left aspect of L4 vertebral body measuring at least 2.3 cm suspicious for osseous metastasis. Pathologic fracture through the superior and inferior endplates. Mild T12 compression deformity is new from prior exam with a lucency in the superior endplate, Schmorl's node versus metastasis. 4. New 2.5 cm low-density lesion in the right hepatic lobe. This is in the region of prior subcapsular collection,  and may represent a metastasis versus new intrahepatic collection. This could be further assessed with MRI as clinically indicated. 5. Heterogeneous enhancement of the spleen, nonspecific. 6. Absent renal excretion on delayed phase imaging, can be seen with renal dysfunction. 7. Colonic diverticulosis without diverticulitis. Aortic Atherosclerosis (ICD10-I70.0). Electronically Signed: By: Keith Rake M.D. On: 07/07/2022 20:48   MR Abdomen W or Wo Contrast  Result Date: 07/08/2022 CLINICAL DATA:  Liver mass on CT scan. History of Whipple procedure for pancreatic cancer. EXAM: MRI ABDOMEN WITHOUT AND WITH CONTRAST TECHNIQUE: Multiplanar multisequence MR imaging of the abdomen was performed both before and after the administration of intravenous contrast. CONTRAST:  5.5m GADAVIST GADOBUTROL 1 MMOL/ML IV SOLN COMPARISON:  CT scan 07/07/2022 FINDINGS: Lower chest: Unremarkable. Hepatobiliary: 6.1 x 5.1 cm irregular heterogeneously enhancing mass lesion is identified in the dome of the  posterior right liver. There appears to be thrombus within a branch of the portal vein along the anteroinferior margin of this lesion (see delayed 90 second postcontrast image 33 of series 16 and coronal postcontrast image 33 of series 18). A second 1.6 cm lesion in the anterior right liver (axial T2 image 15 of series 5) also shows heterogeneous enhancement after IV contrast administration. Both of these lesions restrict diffusion. There is a third focus of restricted diffusion in the subcapsular aspect of the anterior left liver corresponding to a subtle T2 hyperintensity measuring 7 mm on image 16/5. Cholecystectomy. No intrahepatic or extrahepatic biliary dilation. Pancreas: Status post pancreatic head resection. Fine detail of pancreatic parenchyma obscured by motion artifact. Spleen:  No splenomegaly. No focal mass lesion. Adrenals/Urinary Tract: No adrenal nodule or mass. Kidneys unremarkable. Stomach/Bowel: Surgical changes in the stomach compatible with reported history of Whipple procedure. Mild diffuse gaseous distention of small bowel and colon. Vascular/Lymphatic: No abdominal aortic aneurysm. Main portal vein is patent. Small portal caval and retrocaval lymph nodes evident. Other: Small to moderate volume ascites with diffuse mesenteric and soft tissue edema. Musculoskeletal: No focal suspicious marrow enhancement within the visualized bony anatomy. IMPRESSION: 1. 6.1 x 5.1 cm irregular heterogeneously enhancing mass lesion in the dome of the posterior right liver compatible with metastatic disease. There appears to be thrombus within a right portal vein branch inferior to this lesion. 2. Additional 1.6 cm and 0.7 cm hepatic lesions are strict diffusion and are also suspicious for metastatic disease. 3. Sequelae of Whipple procedure. 4. Small to moderate volume ascites with diffuse mesenteric and soft tissue edema. 5. Small portal caval and retrocaval lymph nodes evident. Electronically Signed   By:  EMisty StanleyM.D.   On: 07/08/2022 07:45   CT ABDOMEN PELVIS W CONTRAST  Result Date: 07/07/2022 CLINICAL DATA:  Acute abdominal pain. History of pancreatic cancer. EXAM: CT ABDOMEN AND PELVIS WITH CONTRAST TECHNIQUE: Multidetector CT imaging of the abdomen and pelvis was performed using the standard protocol following bolus administration of intravenous contrast. RADIATION DOSE REDUCTION: This exam was performed according to the departmental dose-optimization program which includes automated exposure control, adjustment of the mA and/or kV according to patient size and/or use of iterative reconstruction technique. CONTRAST:  1052mOMNIPAQUE IOHEXOL 350 MG/ML SOLN COMPARISON:  CT 01/13/2022 FINDINGS: Lower chest: Assessed on concurrent chest CT, reported separately. Hepatobiliary: Lobulated 5.5 cm low-density lesion in the right lobe of the liver is new from May exam and suspicious for metastasis. Tiny hypodense focus centrally series 4, image 22 is at site of prior enhancement and likely a tiny hemangioma. Overall heterogeneous enhancement of the  periphery of the right hepatic lobe, additional subtle lesions are not entirely excluded. The previous subcapsular fluid collection has resolved, however there is new moderate volume of ascites. Cholecystectomy. There is enhancement of the common bile duct which is nondilated. Pancreas: Partial pancreatectomy. The in situ pancreas is atrophic. Pancreatic duct measures 5 mm, previously 4 mm. No evidence of recurrent pancreatic lesion. Spleen: Normal in size without focal abnormality. Adrenals/Urinary Tract: Normal adrenal glands. There is no hydronephrosis. No renal calculi. No evidence of focal renal abnormality. Partially distended urinary bladder, unremarkable for degree of distension. Stomach/Bowel: Small hiatal hernia. There is mild enhancement of the gastric mucosa. Partial gastrectomy and pancreatico duodenectomy. The gastrojejunal anastomosis is patent. Small  bowel is diffusely fluid-filled with mild wall thickening and enhancement. No pattern of obstruction. The appendix is not confidently visualized. Small to moderate colonic stool burden. Sigmoid diverticulosis without diverticulitis. Vascular/Lymphatic: Moderate to advanced aortic atherosclerosis. No aortic aneurysm. The portal vein is patent. Splenic vein is patent. No mesenteric vein thrombosis. Small porta hepatis nodes are all subcentimeter and not enlarged by size criteria. Reproductive: Prostate is unremarkable. Other: Moderate volume of ascites, new from May exam. There is no convincing omental thickening. No free air. Musculoskeletal: Lower lumbar facet hypertrophy. No focal bone lesion. IMPRESSION: 1. New 5.5 cm low-density lesion in the right lobe of the liver. In the setting of history prior pancreatic malignancy, metastasis is favored, however there is enhancement of the common bile duct which raises the possibility of ascending cholangitis and hepatic abscess. Additionally there is ill-defined heterogeneous enhancement in the periphery of the right hepatic lobe which is nonspecific. Recommend hepatic protocol MRI for further assessment. 2. Moderate volume of ascites, new from May exam. 3. Post partial pancreatectomy and without evidence of recurrent pancreatic lesion. 4. Fluid-filled small bowel with mild wall thickening and enhancement, suggesting diffuse enteritis. 5. Sigmoid diverticulosis without diverticulitis. Aortic Atherosclerosis (ICD10-I70.0). These results were called by telephone at the time of interpretation on 07/07/2022 at 11:10 pm to provider DAVID YAO , who verbally acknowledged these results. Electronically Signed   By: Keith Rake M.D.   On: 07/07/2022 23:10   CT Angio Chest PE W and/or Wo Contrast  Result Date: 07/07/2022 CLINICAL DATA:  Pulmonary embolism (PE) suspected, high prob Patient with history of pancreatic cancer. EXAM: CT ANGIOGRAPHY CHEST WITH CONTRAST TECHNIQUE:  Multidetector CT imaging of the chest was performed using the standard protocol during bolus administration of intravenous contrast. Multiplanar CT image reconstructions and MIPs were obtained to evaluate the vascular anatomy. RADIATION DOSE REDUCTION: This exam was performed according to the departmental dose-optimization program which includes automated exposure control, adjustment of the mA and/or kV according to patient size and/or use of iterative reconstruction technique. CONTRAST:  189m OMNIPAQUE IOHEXOL 350 MG/ML SOLN COMPARISON:  Noncontrast chest CT 06/30/2022 FINDINGS: Cardiovascular: There are no filling defects within the pulmonary arteries to suggest pulmonary embolus. The heart is normal in size. There is atherosclerosis of the thoracic aorta without acute aortic findings. Right chest port tip is in the SVC. No pericardial effusion. Mediastinum/Nodes: Similar retrosternal stranding related to subacute sternal fracture, no enlarging mediastinal hematoma. No enlarged mediastinal or hilar lymph nodes. Small hiatal hernia. No suspicious thyroid nodule. Lungs/Pleura: Emphysema. Small bilateral pleural effusions are new from recent CT. The 5 mm left lower lobe nodule series 4, image 106, is unchanged but not well-defined on the current exam due to adjacent atelectasis. Overall increase in streaky subsegmental atelectasis in the lingula and lower lobes from  prior. No pneumothorax. Minimal retained mucus in the upper trachea. Upper Abdomen: Assessed on concurrent abdominopelvic CT, reported separately. Musculoskeletal: Unchanged alignment of displaced sternal fracture. No new osseous findings. Review of the MIP images confirms the above findings. IMPRESSION: 1. No pulmonary embolus. 2. Small bilateral pleural effusions are new from recent CT. Overall increase in streaky subsegmental atelectasis in the lingula and lower lobes from prior. 3. Unchanged alignment of subacute sternal fracture. 4. Unchanged 5 mm  left lower lobe pulmonary nodule. Follow-up recommended per oncologic protocol. Aortic Atherosclerosis (ICD10-I70.0) and Emphysema (ICD10-J43.9). Electronically Signed   By: Keith Rake M.D.   On: 07/07/2022 23:00        Scheduled Meds:  [START ON 07/10/2022] acamprosate  666 mg Oral TID WC   aspirin EC  81 mg Oral Daily   atorvastatin  20 mg Oral Daily   Chlorhexidine Gluconate Cloth  6 each Topical Daily   escitalopram  10 mg Oral Daily   folic acid  1 mg Oral Daily   insulin aspart  0-6 Units Subcutaneous Q4H   lipase/protease/amylase  72,000 Units Oral TID WC   LORazepam  0-4 mg Intravenous Q6H   Followed by   Derrill Memo ON 07/10/2022] LORazepam  0-4 mg Intravenous Q12H   mirtazapine  30 mg Oral QHS   multivitamin with minerals  1 tablet Oral Daily   nicotine  21 mg Transdermal Daily   pantoprazole  40 mg Oral Daily   phosphorus  250 mg Oral BID   pregabalin  75 mg Oral Daily   thiamine  100 mg Oral Daily   Or   thiamine  100 mg Intravenous Daily   Continuous Infusions:  albumin human 25 g (07/09/22 1533)   heparin 1,800 Units/hr (07/09/22 1453)     LOS: 2 days    Time spent: 40 mins    Irine Seal, MD [facility]   To contact the attending provider between 7A-7P or the covering provider during after hours 7P-7A, please log into the web site [email or web address] and access using universal [place] password for that web site. If you do not have the password, please call the hospital operator.  07/09/2022, 5:00 PM

## 2022-07-09 NOTE — Progress Notes (Signed)
ANTICOAGULATION CONSULT NOTE -  follow up  Pharmacy Consult for Heparin Indication:  VTE  Allergies  Allergen Reactions   Bee Venom Anaphylaxis   Lactose Intolerance (Gi) Other (See Comments)    GI upset    Patient Measurements: Height: _0  (180.3 cm) Weight: 62.3 kg (137 lb 5.6 oz) IBW/kg (Calculated) : 75.3 Heparin Dosing Weight: TBW  Vital Signs: Temp: 99.2 F (37.3 C) (11/09 1356) Temp Source: Oral (11/09 1356) BP: 117/71 (11/09 1356) Pulse Rate: 93 (11/09 1356)  Labs: Recent Labs    07/07/22 1757 07/08/22 0542 07/08/22 1023 07/08/22 1023 07/08/22 1857 07/09/22 0448 07/09/22 1319  HGB 8.8* 7.9*  --   --   --  7.8*  --   HCT 26.6* 23.2*  --   --   --  23.6*  --   PLT 98* 79*  --   --   --  99*  --   APTT  --   --  39*  --   --   --   --   LABPROT  --   --  19.2*  --   --   --   --   INR  --   --  1.6*  --   --   --   --   HEPARINUNFRC  --   --  <0.10*   < > <0.10* 0.17* <0.10*  CREATININE 0.52* 0.39*  --   --   --  0.45*  --    < > = values in this interval not displayed.     Estimated Creatinine Clearance: 86.5 mL/min (A) (by C-G formula based on SCr of 0.45 mg/dL (L)).   Medical History: Past Medical History:  Diagnosis Date   Barrett's esophagus dx 2016   Bronchitis    Chronic hepatitis C without hepatic coma ([place]) 10/31/2014   Depression    Diabetes ([place])    ED (erectile dysfunction)    GERD (gastroesophageal reflux disease)    Hepatitis C    Hiatal hernia 10/2014   3cm   Jaundice 02/26/2021   Neuropathy    pancreatic ca 12/2020   Pneumonia 05/14/2020   S/P transmetatarsal amputation of foot, left ([place]) 11/28/2020   Sepsis ([place]) 10/28/2020    Medications:  Infusions:   albumin human Stopped (07/09/22 1058)   heparin 1,400 Units/hr (07/09/22 1100)    Assessment: 44 yoM admitted with uncontrolled pain, diarrhea, general weakness.  CT concerning for possible cholangitis/abscess vs metastatic disease.  PMH also significant for  pancreatic cancer s/p Whipple, concerning for liver metastasis, Barrett esophagus, chronic hepatitis C, alcohol abuse, LLE osteomyelitis, recent sternal/rib fractures.  Pharmacy is consulted to dose Heparin for MRI + right portal vein thrombosis. No prior to admission anticoagulation.  Heparin was previously ordered for this patient, but was d/c (and never started) due to CT results entered on wrong patient.    Baseline coags: INR elevated at 1.6, APTT 39, HL < 0.1 LFTs:  AST/ALT 37/48, Alk Phos 198, Tbili 2 CBC: Hgb 7.9 (decreased to below baseline ~ 10), Plt 79 (baseline ~ 170s) SCr 0.39  07/09/22 Heparin level still SUBtherapeutic despite rate increase early this AM from 1200 to 1400 units/hr Hgb = 7.8 (stable), Pltc 99K (stable) No bleeding complications and no interruption in therapy/line issues noted by RN  Goal of Therapy:  Heparin level 0.3-0.7 units/ml Monitor platelets by anticoagulation protocol: Yes   Plan:  Give repeat heparin 1500 units bolus IV x 1 Then increase heparin IV infusion  from 1400 units/hr to 1800 units/hr Recheck heparin level 6 hours after rate increase Daily heparin level and CBC   Adrian Saran, PharmD, BCPS Secure Chat if ?s 07/09/2022 2:27 PM

## 2022-07-09 NOTE — Progress Notes (Signed)
PT Cancellation Note  Patient Details Name: [name] [name] MRN: [id number] DOB: Jan 02, 1961   Cancelled Treatment:    Reason Eval/Treat Not Completed: Medical issues which prohibited therapy, will follow for [place],  [place] Office [phone] Weekend [phone]    Claretha Cooper 07/09/2022, 3:22 PM

## 2022-07-09 NOTE — Progress Notes (Signed)
Hematology/Oncology Progress Note  Clinical Summary: [name] is a 61 year old male with medical history significant for pancreatic adenocarcinoma status post resection now with recurrence in the liver who is admitted for progressive weakness and deconditioning.  Interval History: --CT abdomen pelvis performed on 07/07/2022 showed new lesions including right adrenal nodule, L4 vertebral lesion, and right hepatic lesion. -- MRI performed on 07/08/2022 showed liver dome mass as well as a thrombus in the right portal vein, inferior to this lesion. -- Reports that he continues to have sternal pain from his sternal fracture.  He is having a cough which is quite painful. -- Notes he is eating about 1 meal a day.  Likes to go out to [place] caf near his house and eat egg drop soup. -- Became tearful and noted that he "could not handle anymore chemo".  He discussed this with palliative care today.  O:  Vitals:   07/09/22 0733 07/09/22 1356  BP: 129/78 117/71  Pulse: 97 93  Resp: 20 20  Temp: 98.8 F (37.1 C) 99.2 F (37.3 C)  SpO2: 94% 91%      Latest Ref Rng & Units 07/09/2022    4:48 AM 07/08/2022    5:42 AM 07/07/2022    5:57 PM  CMP  Glucose 70 - 99 mg/dL 160  220  289   BUN 6 - 20 mg/dL 5  <5  6   Creatinine 0.61 - 1.24 mg/dL 0.45  0.39  0.52   Sodium 135 - 145 mmol/L 132  135  132   Potassium 3.5 - 5.1 mmol/L 4.0  3.4  3.0   Chloride 98 - 111 mmol/L 105  106  101   CO2 22 - 32 mmol/L '24  26  24   '$ Calcium 8.9 - 10.3 mg/dL 7.1  7.1  7.2   Total Protein 6.5 - 8.1 g/dL 4.8  4.6  5.5   Total Bilirubin 0.3 - 1.2 mg/dL 1.4  2.0  2.0   Alkaline Phos 38 - 126 U/L 193  198  234   AST 15 - 41 U/L 35  37  59   ALT 0 - 44 U/L 37  48  63       Latest Ref Rng & Units 07/09/2022    4:48 AM 07/08/2022    5:42 AM 07/07/2022    5:57 PM  CBC  WBC 4.0 - 10.5 K/uL 7.7  6.9  8.3   Hemoglobin 13.0 - 17.0 g/dL 7.8  7.9  8.8   Hematocrit 39.0 - 52.0 % 23.6  23.2  26.6   Platelets 150 - 400  K/uL 99  79  98       GENERAL: Chronically ill-appearing middle-aged Caucasian male in NAD  SKIN: skin color, texture, turgor are normal, no rashes or significant lesions EYES: conjunctiva are pink and non-injected, sclera clear LUNGS: clear to auscultation and percussion with normal breathing effort HEART: regular rate & rhythm and no murmurs and no lower extremity edema Musculoskeletal: no cyanosis of digits and no clubbing  PSYCH: alert & oriented x 3, fluent speech NEURO: no focal motor/sensory deficits  Assessment/Plan:  # Adenocarcinoma of the Pancreas, Stage IIB # Recurrent Pancreatic Adenocarcinoma, Metastatic -- At this time disease appears borderline resectable.  We will proceed with neoadjuvant chemotherapy.  At this time would prefer a regimen of FOLFIRINOX given his good baseline health. -- case reviewed by surgery, who agrees his disease is borderline resectable.  -- baseline elevations in both CA 19-9  and CEA. Continue to monitor during treatment.  -- Cycle 1 Day 1 of FOLFIRINOX started 04/10/2021. Cycle 12 Day 1 on 09/24/2021, completed neoadjuvant chemotherapy. --Underwent open whipple with portal vein reconstruction utilizing a R deep FV graft on 10/28/2021 at [facility]. Noted to have positive margins, adjuvant chemoradiation was recommended. Plan: --CT scan from ED shows continued clear progression of disease --we were planning for monotherapy gemcitabine for treatment, but with his clinical worsening I do not think he will reach a point where he can start treatment.  --due to his poor functional status and open wound on his foot he is a markedly poor candidate for systemic therapy. --patient is hospice appropriate at this time.  Appreciate support and recommendations of palliative care.  # Tumor Thrombus of Portal Vein -- agree with heparin therapy. Can consider transition to [place] therapy upon discharge.  --Plt count is appropriate for continue  anticoagulation  # Anemia/Thrombocytopenia -- in setting of chronic disease/inflammation -- supportive care only at this time  -- continue to monitor.    Ledell Peoples, MD Department of Hematology/Oncology [place] [facility] Phone: [phone] Pager: [phone] Email: [email or web address]

## 2022-07-10 ENCOUNTER — Inpatient Hospital Stay (HOSPITAL_COMMUNITY): Payer: Medicaid Other

## 2022-07-10 DIAGNOSIS — K769 Liver disease, unspecified: Secondary | ICD-10-CM | POA: Diagnosis not present

## 2022-07-10 DIAGNOSIS — C259 Malignant neoplasm of pancreas, unspecified: Secondary | ICD-10-CM | POA: Diagnosis not present

## 2022-07-10 DIAGNOSIS — I10 Essential (primary) hypertension: Secondary | ICD-10-CM | POA: Diagnosis not present

## 2022-07-10 DIAGNOSIS — D696 Thrombocytopenia, unspecified: Secondary | ICD-10-CM | POA: Diagnosis not present

## 2022-07-10 LAB — GLUCOSE, CAPILLARY
Glucose-Capillary: 138 mg/dL — ABNORMAL HIGH (ref 70–99)
Glucose-Capillary: 167 mg/dL — ABNORMAL HIGH (ref 70–99)
Glucose-Capillary: 204 mg/dL — ABNORMAL HIGH (ref 70–99)
Glucose-Capillary: 208 mg/dL — ABNORMAL HIGH (ref 70–99)
Glucose-Capillary: 210 mg/dL — ABNORMAL HIGH (ref 70–99)
Glucose-Capillary: 286 mg/dL — ABNORMAL HIGH (ref 70–99)
Glucose-Capillary: 90 mg/dL (ref 70–99)

## 2022-07-10 LAB — CBC WITH DIFFERENTIAL/PLATELET
Abs Immature Granulocytes: 0.03 10*3/uL (ref 0.00–0.07)
Basophils Absolute: 0 10*3/uL (ref 0.0–0.1)
Basophils Relative: 0 %
Eosinophils Absolute: 0.1 10*3/uL (ref 0.0–0.5)
Eosinophils Relative: 1 %
HCT: 21.5 % — ABNORMAL LOW (ref 39.0–52.0)
Hemoglobin: 7.5 g/dL — ABNORMAL LOW (ref 13.0–17.0)
Immature Granulocytes: 0 %
Lymphocytes Relative: 5 %
Lymphs Abs: 0.4 10*3/uL — ABNORMAL LOW (ref 0.7–4.0)
MCH: 33.3 pg (ref 26.0–34.0)
MCHC: 34.9 g/dL (ref 30.0–36.0)
MCV: 95.6 fL (ref 80.0–100.0)
Monocytes Absolute: 1.2 10*3/uL — ABNORMAL HIGH (ref 0.1–1.0)
Monocytes Relative: 17 %
Neutro Abs: 5.5 10*3/uL (ref 1.7–7.7)
Neutrophils Relative %: 77 %
Platelets: 114 10*3/uL — ABNORMAL LOW (ref 150–400)
RBC: 2.25 MIL/uL — ABNORMAL LOW (ref 4.22–5.81)
RDW: 17.2 % — ABNORMAL HIGH (ref 11.5–15.5)
WBC: 7.2 10*3/uL (ref 4.0–10.5)
nRBC: 0 % (ref 0.0–0.2)

## 2022-07-10 LAB — COMPREHENSIVE METABOLIC PANEL
ALT: 30 U/L (ref 0–44)
AST: 30 U/L (ref 15–41)
Albumin: 2.9 g/dL — ABNORMAL LOW (ref 3.5–5.0)
Alkaline Phosphatase: 170 U/L — ABNORMAL HIGH (ref 38–126)
Anion gap: 3 — ABNORMAL LOW (ref 5–15)
BUN: 6 mg/dL (ref 6–20)
CO2: 25 mmol/L (ref 22–32)
Calcium: 7.6 mg/dL — ABNORMAL LOW (ref 8.9–10.3)
Chloride: 105 mmol/L (ref 98–111)
Creatinine, Ser: 0.42 mg/dL — ABNORMAL LOW (ref 0.61–1.24)
GFR, Estimated: >60 mL/min (ref >60)
Glucose, Bld: 206 mg/dL — ABNORMAL HIGH (ref 70–99)
Potassium: 3.6 mmol/L (ref 3.5–5.1)
Sodium: 133 mmol/L — ABNORMAL LOW (ref 135–145)
Total Bilirubin: 1.3 mg/dL — ABNORMAL HIGH (ref 0.3–1.2)
Total Protein: 5.7 g/dL — ABNORMAL LOW (ref 6.5–8.1)

## 2022-07-10 LAB — HEMOGLOBIN A1C
Hgb A1c MFr Bld: 5.1 % (ref 4.8–5.6)
Mean Plasma Glucose: 99.67 mg/dL

## 2022-07-10 LAB — CBC
HCT: 23.6 % — ABNORMAL LOW (ref 39.0–52.0)
Hemoglobin: 7.8 g/dL — ABNORMAL LOW (ref 13.0–17.0)
MCH: 32.6 pg (ref 26.0–34.0)
MCHC: 33.1 g/dL (ref 30.0–36.0)
MCV: 98.7 fL (ref 80.0–100.0)
Platelets: 113 10*3/uL — ABNORMAL LOW (ref 150–400)
RBC: 2.39 MIL/uL — ABNORMAL LOW (ref 4.22–5.81)
RDW: 17.2 % — ABNORMAL HIGH (ref 11.5–15.5)
WBC: 7 10*3/uL (ref 4.0–10.5)
nRBC: 0 % (ref 0.0–0.2)

## 2022-07-10 LAB — RENAL FUNCTION PANEL
Albumin: 2.7 g/dL — ABNORMAL LOW (ref 3.5–5.0)
Anion gap: 5 (ref 5–15)
BUN: <5 mg/dL — ABNORMAL LOW (ref 6–20)
CO2: 24 mmol/L (ref 22–32)
Calcium: 7.6 mg/dL — ABNORMAL LOW (ref 8.9–10.3)
Chloride: 105 mmol/L (ref 98–111)
Creatinine, Ser: 0.38 mg/dL — ABNORMAL LOW (ref 0.61–1.24)
GFR, Estimated: >60 mL/min (ref >60)
Glucose, Bld: 132 mg/dL — ABNORMAL HIGH (ref 70–99)
Phosphorus: 2.3 mg/dL — ABNORMAL LOW (ref 2.5–4.6)
Potassium: 3.6 mmol/L (ref 3.5–5.1)
Sodium: 134 mmol/L — ABNORMAL LOW (ref 135–145)

## 2022-07-10 LAB — HEPARIN LEVEL (UNFRACTIONATED)
Heparin Unfractionated: 0.14 IU/mL — ABNORMAL LOW (ref 0.30–0.70)
Heparin Unfractionated: 0.2 IU/mL — ABNORMAL LOW (ref 0.30–0.70)

## 2022-07-10 LAB — MAGNESIUM
Magnesium: 1.9 mg/dL (ref 1.7–2.4)
Magnesium: 2.2 mg/dL (ref 1.7–2.4)

## 2022-07-10 MED ORDER — ENOXAPARIN SODIUM 60 MG/0.6ML IJ SOSY
60.0000 mg | PREFILLED_SYRINGE | Freq: Two times a day (BID) | INTRAMUSCULAR | Status: DC
  Administered 2022-07-10 – 2022-07-12 (×5): 60 mg via SUBCUTANEOUS
  Filled 2022-07-10 (×5): qty 0.6

## 2022-07-10 MED ORDER — HEPARIN BOLUS VIA INFUSION
2000.0000 [IU] | Freq: Once | INTRAVENOUS | Status: AC
  Administered 2022-07-10: 2000 [IU] via INTRAVENOUS
  Filled 2022-07-10: qty 2000

## 2022-07-10 MED ORDER — K PHOS MONO-SOD PHOS DI & MONO 155-852-130 MG PO TABS
500.0000 mg | ORAL_TABLET | Freq: Two times a day (BID) | ORAL | Status: AC
  Administered 2022-07-10 – 2022-07-13 (×6): 500 mg via ORAL
  Filled 2022-07-10 (×6): qty 2

## 2022-07-10 NOTE — NC FL2 (Signed)
[place] LEVEL OF CARE SCREENING TOOL     IDENTIFICATION  Patient Name: [name] [name] Birthdate: June 22, 1961 Sex: male Admission Date (Current Location): 07/07/2022  [facility] and [place] Number:  Herbalist and Address:  [facility],  [address], [place]      Provider Number: [id number]  Attending Physician Name and Address:  Eugenie Filler, MD  Relative Name and Phone Number:  Dang Mathison [phone]    Current Level of Care: Hospital Recommended Level of Care: [place] Prior Approval Number:    Date Approved/Denied:   PASRR Number:    Discharge Plan: SNF    Current Diagnoses: Patient Active Problem List   Diagnosis Date Noted   Enteritis 07/08/2022   Thrombocytopenia ([place]) 07/08/2022   Normocytic anemia 07/08/2022   Hypomagnesemia 07/08/2022   Portal vein thrombosis 07/08/2022   Diabetes mellitus ([place]) 07/08/2022   Anemia 07/08/2022   Pancreatic cancer metastasized to liver ([place]) 07/08/2022   Liver lesion, right lobe 07/07/2022   Closed fracture of body of sternum 07/04/2022   Diarrhea 06/29/2022   Fall at home, initial encounter 06/29/2022   Medication management 06/01/2022   Smoking 06/01/2022   Protein-calorie malnutrition, severe 01/07/2022   Type 2 diabetes mellitus with ketosis ([place]) 01/05/2022   Pericardial effusion 01/05/2022   Pressure injury of skin 01/05/2022   Abdominal fluid collection 12/19/2021   Encounter for dental examination 09/12/2021   Teeth missing 09/12/2021   [place] 09/12/2021   Defective dental restoration 09/12/2021   Retained tooth root 09/12/2021   Chronic periodontitis 09/12/2021   Accretions on teeth 09/12/2021   Oral mucositis 09/12/2021   Ill-fitting dentures 09/12/2021   Wound of foot 09/10/2021   Closed displaced fracture of proximal phalanx of finger of left hand 06/12/2021   AKI (acute kidney injury) ([place]) 05/12/2021   Abdominal distension     Gastroparesis    Gastric outlet obstruction 05/05/2021   Abdominal pain 05/05/2021   Dehydration 05/05/2021   Nausea & vomiting 05/04/2021   Port-A-Cath in place 04/24/2021   Malnutrition of moderate degree 03/27/2021   Hyperglycemia due to diabetes mellitus ([place]) 03/26/2021   Pancreatic cancer ([place]) 03/12/2021   Malignant neoplasm of head of pancreas ([place]) 03/12/2021   Loss of weight 02/26/2021   Hypertension associated with type 2 diabetes mellitus ([place]) 11/28/2020   Type 2 diabetes mellitus with diabetic neuropathy, unspecified ([place]) 11/28/2020   Hyperlipidemia associated with type 2 diabetes mellitus ([place]) 11/28/2020   GERD without esophagitis 11/28/2020   Major depression, recurrent, chronic ([place]) 11/28/2020   Aortic atherosclerosis ([place]) 11/28/2020   Diabetic neuropathy ([place]) 11/28/2020   Osteomyelitis ([place]) 11/14/2020   Elevated LFTs    Hypophosphatemia    Depression    Physical deconditioning    Hyponatremia 05/13/2020   Hypokalemia 05/13/2020   Hereditary hemochromatosis ([place]) 11/23/2017   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   Essential hypertension 04/02/2017   Hypogonadism male 08/13/2016   ETOH abuse 06/22/2016   Low serum testosterone level 01/08/2016   Type 2 diabetes mellitus with other specified complication ([place]) 86/76/7209   Erectile dysfunction 09/12/2015   Chronic hepatitis C without hepatic coma ([place]) 10/31/2014   Tobacco use disorder 08/03/2013   Diabetes type 2, controlled ([place]) 04/27/2013    Orientation RESPIRATION BLADDER Height & Weight     Self, Time, Situation, Place  Normal Continent Weight: 63.6 kg Height:  '5\' 11"'$  (180.3 cm)  BEHAVIORAL SYMPTOMS/MOOD NEUROLOGICAL BOWEL NUTRITION STATUS     (  n/a) Continent Diet  AMBULATORY STATUS COMMUNICATION OF NEEDS Skin   Limited Assist Verbally Skin abrasions (right and left leg)                       Personal Care Assistance Level of Assistance  Bathing, Feeding, Dressing  Bathing Assistance: Limited assistance Feeding assistance: Limited assistance (set up) Dressing Assistance: Limited assistance     Functional Limitations Info  Sight, Hearing, Speech Sight Info: Adequate Hearing Info: Adequate Speech Info: Adequate    SPECIAL CARE FACTORS FREQUENCY  PT (By licensed PT), OT (By licensed OT)     PT Frequency: 5X/wk OT Frequency: 5X/wk            Contractures Contractures Info: Not present    Additional Factors Info  Code Status, Allergies, Psychotropic, Insulin Sliding Scale, Isolation Precautions, Suctioning Needs Code Status Info: DNR Allergies Info: Bee venom, lactose intolerance Psychotropic Info: Remeron see discharge summary Insulin Sliding Scale Info: see discharge summary Isolation Precautions Info: n/a Suctioning Needs: n/a   Current Medications (07/10/2022):  This is the current hospital active medication list Current Facility-Administered Medications  Medication Dose Route Frequency Provider Last Rate Last Admin   acamprosate (CAMPRAL) tablet 666 mg  666 mg Oral TID WC Lane Hacker L, DO   666 mg at 07/10/22 2951   acetaminophen (TYLENOL) tablet 650 mg  650 mg Oral Q6H PRN Opyd, Ilene Qua, MD   650 mg at 07/10/22 0401   Or   acetaminophen (TYLENOL) suppository 650 mg  650 mg Rectal Q6H PRN Opyd, Ilene Qua, MD       albuterol (PROVENTIL) (2.5 MG/3ML) 0.083% nebulizer solution 2.5 mg  2.5 mg Nebulization Q4H PRN Opyd, Ilene Qua, MD       aspirin EC tablet 81 mg  81 mg Oral Daily Opyd, Ilene Qua, MD   81 mg at 07/10/22 1011   atorvastatin (LIPITOR) tablet 20 mg  20 mg Oral Daily Opyd, Ilene Qua, MD   20 mg at 07/10/22 1011   bisacodyl (DULCOLAX) EC tablet 5 mg  5 mg Oral Daily PRN Opyd, Ilene Qua, MD       Chlorhexidine Gluconate Cloth 2 % PADS 6 each  6 each Topical Daily Eugenie Filler, MD   6 each at 07/10/22 1011   enoxaparin (LOVENOX) injection 60 mg  60 mg Subcutaneous Q12H Batchelder, Cecilio Asper, RPH        escitalopram (LEXAPRO) tablet 10 mg  10 mg Oral Daily Opyd, Ilene Qua, MD   10 mg at 88/41/66 0630   folic acid (FOLVITE) tablet 1 mg  1 mg Oral Daily Opyd, Ilene Qua, MD   1 mg at 07/10/22 1011   HYDROmorphone (DILAUDID) injection 0.5-1 mg  0.5-1 mg Intravenous Q3H PRN Ledell Peoples IV, MD   1 mg at 07/10/22 1601   insulin aspart (novoLOG) injection 0-6 Units  0-6 Units Subcutaneous Q4H Opyd, Ilene Qua, MD   2 Units at 07/10/22 0023   lipase/protease/amylase (CREON) capsule 36,000 Units  36,000 Units Oral PRN Opyd, Ilene Qua, MD       lipase/protease/amylase (CREON) capsule 72,000 Units  72,000 Units Oral TID WC Opyd, Ilene Qua, MD   72,000 Units at 07/10/22 0816   LORazepam (ATIVAN) injection 0-4 mg  0-4 mg Intravenous Q12H Opyd, Ilene Qua, MD       LORazepam (ATIVAN) tablet 1-4 mg  1-4 mg Oral Q1H PRN Opyd, Ilene Qua, MD  Or   LORazepam (ATIVAN) injection 1-4 mg  1-4 mg Intravenous Q1H PRN Opyd, Ilene Qua, MD       mirtazapine (REMERON) tablet 30 mg  30 mg Oral QHS Opyd, Ilene Qua, MD   30 mg at 07/09/22 2047   multivitamin with minerals tablet 1 tablet  1 tablet Oral Daily Opyd, Ilene Qua, MD   1 tablet at 07/10/22 1011   nicotine (NICODERM CQ - dosed in mg/24 hours) patch 21 mg  21 mg Transdermal Daily Lane Hacker L, DO   21 mg at 07/10/22 1150   ondansetron (ZOFRAN) tablet 4 mg  4 mg Oral Q6H PRN Opyd, Ilene Qua, MD       Or   ondansetron (ZOFRAN) injection 4 mg  4 mg Intravenous Q6H PRN Opyd, Ilene Qua, MD       pantoprazole (PROTONIX) EC tablet 40 mg  40 mg Oral Daily Opyd, Ilene Qua, MD   40 mg at 07/10/22 1011   phosphorus (K PHOS NEUTRAL) tablet 500 mg  500 mg Oral BID Eugenie Filler, MD       polyethylene glycol (MIRALAX / GLYCOLAX) packet 17 g  17 g Oral Daily PRN Opyd, Ilene Qua, MD       pregabalin (LYRICA) capsule 75 mg  75 mg Oral Daily Opyd, Ilene Qua, MD   75 mg at 07/10/22 1011   thiamine (VITAMIN B1) tablet 100 mg  100 mg Oral Daily Opyd, Ilene Qua, MD   100  mg at 07/10/22 1011   Or   thiamine (VITAMIN B1) injection 100 mg  100 mg Intravenous Daily Opyd, Ilene Qua, MD       zolpidem (AMBIEN) tablet 10 mg  10 mg Oral QHS PRN Opyd, Ilene Qua, MD   10 mg at 07/09/22 2057     Discharge Medications: Please see discharge summary for a list of discharge medications.  Relevant Imaging Results:  Relevant Lab Results:   Additional Information SS# [id number]  Angelita Ingles, RN

## 2022-07-10 NOTE — Progress Notes (Signed)
ANTICOAGULATION CONSULT NOTE -  follow up  Pharmacy Consult for Heparin Indication:  VTE  Allergies  Allergen Reactions   Bee Venom Anaphylaxis   Lactose Intolerance (Gi) Other (See Comments)    GI upset    Patient Measurements: Height: _0  (180.3 cm) Weight: 62.3 kg (137 lb 5.6 oz) IBW/kg (Calculated) : 75.3 Heparin Dosing Weight: TBW  Vital Signs: Temp: 98.6 F (37 C) (11/09 2338) Temp Source: Oral (11/09 2338) BP: 120/72 (11/09 2338) Pulse Rate: 100 (11/09 2338)  Labs: Recent Labs    07/08/22 0542 07/08/22 1023 07/08/22 1857 07/09/22 0448 07/09/22 1319 07/09/22 2258 07/09/22 2359  HGB 7.9*  --   --  7.8*  --   --  7.8*  HCT 23.2*  --   --  23.6*  --   --  23.6*  PLT 79*  --   --  99*  --   --  113*  APTT  --  39*  --   --   --   --   --   LABPROT  --  19.2*  --   --   --   --   --   INR  --  1.6*  --   --   --   --   --   HEPARINUNFRC  --  <0.10*   < > 0.17* <0.10* >1.10* 0.14*  CREATININE 0.39*  --   --  0.45*  --   --  0.42*   < > = values in this interval not displayed.     Estimated Creatinine Clearance: 86.5 mL/min (A) (by C-G formula based on SCr of 0.42 mg/dL (L)).   Medical History: Past Medical History:  Diagnosis Date   Barrett's esophagus dx 2016   Bronchitis    Chronic hepatitis C without hepatic coma ([place]) 10/31/2014   Depression    Diabetes ([place])    ED (erectile dysfunction)    GERD (gastroesophageal reflux disease)    Hepatitis C    Hiatal hernia 10/2014   3cm   Jaundice 02/26/2021   Neuropathy    pancreatic ca 12/2020   Pneumonia 05/14/2020   S/P transmetatarsal amputation of foot, left ([place]) 11/28/2020   Sepsis ([place]) 10/28/2020    Medications:  Infusions:   albumin human 25 g (07/09/22 2104)   heparin 1,800 Units/hr (07/09/22 2124)    Assessment: 9 yoM admitted with uncontrolled pain, diarrhea, general weakness.  CT concerning for possible cholangitis/abscess vs metastatic disease.  PMH also significant for  pancreatic cancer s/p Whipple, concerning for liver metastasis, Barrett esophagus, chronic hepatitis C, alcohol abuse, LLE osteomyelitis, recent sternal/rib fractures.  Pharmacy is consulted to dose Heparin for MRI + right portal vein thrombosis. No prior to admission anticoagulation.  Heparin was previously ordered for this patient, but was d/c (and never started) due to CT results entered on wrong patient.    Baseline coags: INR elevated at 1.6, APTT 39, HL < 0.1 LFTs:  AST/ALT 37/48, Alk Phos 198, Tbili 2 CBC: Hgb 7.9 (decreased to below baseline ~ 10), Plt 79 (baseline ~ 170s) SCr 0.39  07/10/22 Heparin level that was drawn peripherally 0.14- still SUBtherapeutic despite rate increase earlier today from 1400 to 1800 units/hr Hgb = 7.8 (stable), Pltc 113K (stable) No bleeding complications and no interruption in therapy/line issues noted by RN  Goal of Therapy:  Heparin level 0.3-0.7 units/ml Monitor platelets by anticoagulation protocol: Yes   Plan:  Give repeat heparin 2000 units bolus IV x 1 Then increase  heparin IV infusion to 2050 units/hr Recheck heparin level 6 hours after rate increase Daily heparin level and CBC  Netta Cedars, PharmD, BCPS 07/10/2022 1:06 AM

## 2022-07-10 NOTE — Progress Notes (Signed)
PROGRESS NOTE    [name] [name]  KDX:[id number] DOB: 07/16/1961 DOA: 07/07/2022 PCP: [name], NP    Chief Complaint  Patient presents with   Weakness    Brief Narrative:  HPI per Dr. Clinton Quant is a 61 y.o. male with medical history significant for type 2 diabetes mellitus, Barrett esophagus, chronic hepatitis C, alcohol abuse, poorly healing left TMA with osteomyelitis, recent sternal body fracture, and pancreatic cancer status post Whipple with imaging at [facility] in September concerning for liver metastasis, now presenting to the emergency department with uncontrolled pain, generalized weakness and debility, and diarrhea.   Patient reports that he has been having approximately 4 loose stools every day and has had difficulty controlling his sternal and abdominal pain with his home medications.  He also complains of progressive general weakness, now with great difficulty transferring to and from his wheelchair.  He has not noted any subjective fever or chills.    ED Course: Upon arrival to the ED, patient is found to be afebrile and saturating well on room air with mild tachycardia and stable blood pressure.  He is in sinus or ectopic atrial tachycardia with rate 104 on EKG.  CTA chest is negative for PE but notable for small bilateral pleural effusions and unchanged sternal fracture and pulmonary nodule.  CT of the abdomen pelvis is notable for new 5.5 cm right liver lobe lesion which is likely metastatic but CBD enhancement is concerning for cholangitis with abscess.  Labs notable for glucose 289, potassium 3.0, alkaline phosphatase 234, albumin 1.9, AST 59, AST 63, total bilirubin 2.0, hemoglobin 8.8, and platelets 98,000.   Patient was treated with Dilaudid, Zofran, oral potassium, and a liter of normal saline in the ED.    Assessment & Plan:   Principal Problem:   Liver lesion, right lobe Active Problems:   Chronic hepatitis C without hepatic coma (HCC)   Type 2  diabetes mellitus with other specified complication (HCC)   ETOH abuse   Essential hypertension   Hypokalemia   Pancreatic cancer (HCC)   Wound of foot   Closed fracture of body of sternum   Enteritis   Thrombocytopenia (HCC)   Normocytic anemia   Hypomagnesemia   Portal vein thrombosis   Diabetes mellitus (HCC)   Anemia   Pancreatic cancer metastasized to liver ([place])  #1 liver lesion -New 5.5 cm right lobe liver lesion noted on CT favored by radiologist to reflect metastatic disease but CBD enhancement raises concern for possible cholangitis versus abscess. -Patient started empirically on IV antibiotics currently on IV Zosyn. -MRI abdomen done with a 6.1 x 5.1 cm irregular heterogeneous enhancing mass lesion in the dome of the posterior right liver compatible with metastatic disease.  Appears to be thrombus in the right portal vein branch inferior to the lesion.  Additionally 1.6 cm and 0.7 cm hepatic lesions restricted diffusion and also suspicious for metastatic disease.  Sequela of Whipple procedure.  Small to moderate volume ascites with diffuse mesenteric and soft tissue edema.  Small portacaval and retrocaval lymph nodes evident. -Discontinued IV antibiotics. -Patient's primary oncologist, Dr. Lorenso Courier informed of admission via epic. -Oncology has assessed the patient, due to patient's poor functional status, recent infection of the foot few patient is a poor systemic candidate and feel patient is hospice appropriate. -Palliative care consulted and following.   2.  Thrombus within right portal vein branch inferior to the lesion -Patient hypercoagulable state secondary to metastatic cancer. -On IV heparin with  transition to full dose Lovenox and if continued improvement could likely transition to oral DOAC in the next 1 to 2 days.    3.  Enteritis -Patient states has been having soft mushy stools. -Patient with electrolyte abnormalities. -C. difficile PCR negative. -GI pathogen  panel negative. -Continue pancreatic enzymes.  4.  Pancreatic cancer -Diagnosed June 2022, status post FOLFIRINOX from August 2022-January 2023, status post Whipple February 2023, chemoradiation June-August 2023. -CT at [facility] in September confirms metastatic disease to the liver. -Further treatment on hold due to left foot infection. -Oncology informed of admission and following patient and feel due to poor functional status open wound on his foot patient a poor candidate for systemic therapy at this time and feels hospice may be appropriate.   -Continue pancreatic enzymes/Creon.  -Palliative care following.  5.  Anemia/thrombocytopenia -Patient with no overt bleeding.  Hemoglobin currently at 7.5 from 7.8 from 7.9 from 8.8 on admission.  Platelet count of 114 from 113 from 99 from 79 from 98 on admission. -Patient on IV heparin for thrombus within the right portal vein branch monitor closely. -Hematology/oncology informed of admission via epic and will be assessing the patient..  6.  Sternal fracture -Patient noted to have had a fall 2010/31 and suffered a sternal body fracture. -Sternal fracture unchanged.  CT. -Continue pain management.  7.  Left foot osteomyelitis -Status post TMA. -Status post 6 weeks of vancomycin and Levaquin 07/01/2022. -Continue podiatry and ID follow-up in the outpatient settings. -Sutures removed.    8.  Hypokalemia/hypomagnesemia/hypophosphatemia -Likely secondary to metastatic disease. -Status post K-Phos 30 mmol IV x1. -Phosphorus at 2.3. -Increase oral phosphorus tablets to 500 twice daily for the next 3 days.   -Magnesium at 1.9. -Potassium repleted currently at 3.6. -Repeat labs in the AM.  9.  Insulin-dependent diabetes mellitus -Hemoglobin A1c 8.06 April 2022. -Repeat hemoglobin A1c 5.1. -CBG 138 this morning. -SSI, continue home regimen Lyrica.  10.  Alcohol abuse -Patient with no overt withdrawal symptoms. -Continue the Ativan CIWA  protocol, thiamine, folic acid, multivitamin  11.  Debility -Patient with complaints of progressive generalized weakness difficulty transferring to and from wheelchair. -PT/OT has assessed patient and recommended SNF placement.    DVT prophylaxis: Heparin>>> Lovenox Code Status: DNR Family Communication: Updated patient.  No family at bedside. Disposition: TBD  Status is: Inpatient The patient will require care spanning > 2 midnights and should be moved to inpatient because: Severity of illness, unsafe disposition.   Consultants:  Palliative care: Dr. Hilma Favors 07/08/2022 Oncology: Dr. Lorenso Courier 07/09/2022  Procedures:  CT abdomen and pelvis 07/07/2022 CT angiogram chest 07/07/2022 MRI abdomen 07/08/2022  Antimicrobials:  IV cefepime 07/07/2022>>> 07/08/2022 IV Flagyl 07/07/2022>>>> 07/08/2022 IV Zosyn 07/08/2022>>>>> 07/08/2022   Subjective: Sitting up in chair.  States was only able to get an hour sleep last night due to IV beeping and multiple interruptions.  Still with significant weakness.  Still with sternal chest pain and chronic abdominal pain.  Denies any bleeding.  Objective: Vitals:   07/09/22 2338 07/10/22 0400 07/10/22 0600 07/10/22 0648  BP: 120/72 (!) 145/88  130/83  Pulse: 100 (!) 104  (!) 102  Resp: '18 18  17  '$ Temp: 98.6 F (37 C) (!) 100.4 F (38 C) 98 F (36.7 C) 98.9 F (37.2 C)  TempSrc: Oral Oral Oral Oral  SpO2: 92% 93%  91%  Weight:  63.6 kg    Height:        Intake/Output Summary (Last 24 hours) at 07/10/2022 1106  Last data filed at 07/10/2022 0900 Gross per 24 hour  Intake 661.75 ml  Output 900 ml  Net -238.25 ml    Filed Weights   07/07/22 1724 07/09/22 0404 07/10/22 0400  Weight: 54 kg 62.3 kg 63.6 kg    Examination:  General exam: Frail.  NAD. Respiratory system: CTA B.  No wheezes, no crackles, no rhonchi.  Fair air movement.  Speaking in full sentences.   Cardiovascular system: Regular rate rhythm no murmurs rubs or gallops.  No JVD.   No lower extremity edema.    Gastrointestinal system: Abdomen is nondistended, soft and tender to palpation in the left lower quadrant (chronic).  Positive bowel sounds.  No rebound.  No guarding.  Central nervous system: Alert and oriented. No focal neurological deficits. Extremities: Status post left transmetatarsal amputation incision site c/d/I.  symmetric 5 x 5 power. Skin: No rashes, lesions or ulcers Psychiatry: Judgement and insight appear normal. Mood & affect appropriate.     Data Reviewed: I have personally reviewed following labs and imaging studies  CBC: Recent Labs  Lab 07/07/22 1757 07/08/22 0542 07/09/22 0448 07/09/22 2359 07/10/22 0716  WBC 8.3 6.9 7.7 7.0 7.2  NEUTROABS 6.2  --   --   --  5.5  HGB 8.8* 7.9* 7.8* 7.8* 7.5*  HCT 26.6* 23.2* 23.6* 23.6* 21.5*  MCV 98.5 96.7 97.1 98.7 95.6  PLT 98* 79* 99* 113* 114*     Basic Metabolic Panel: Recent Labs  Lab 07/07/22 1757 07/08/22 0542 07/09/22 0448 07/09/22 2359 07/10/22 0716  NA 132* 135 132* 133* 134*  K 3.0* 3.4* 4.0 3.6 3.6  CL 101 106 105 105 105  CO2 '24 26 24 25 24  '$ GLUCOSE 289* 220* 160* 206* 132*  BUN 6 <5* 5* 6 <5*  CREATININE 0.52* 0.39* 0.45* 0.42* 0.38*  CALCIUM 7.2* 7.1* 7.1* 7.6* 7.6*  MG  --  1.5* 1.8 2.2 1.9  PHOS  --  1.9* 2.2*  --  2.3*     GFR: Estimated Creatinine Clearance: 88.3 mL/min (A) (by C-G formula based on SCr of 0.38 mg/dL (L)).  Liver Function Tests: Recent Labs  Lab 07/07/22 1757 07/08/22 0542 07/09/22 0448 07/09/22 2359 07/10/22 0716  AST 59* 37 35 30  --   ALT 63* 48* 37 30  --   ALKPHOS 234* 198* 193* 170*  --   BILITOT 2.0* 2.0* 1.4* 1.3*  --   PROT 5.5* 4.6* 4.8* 5.7*  --   ALBUMIN 1.9* 1.6* 1.5* 2.9* 2.7*     CBG: Recent Labs  Lab 07/09/22 1600 07/09/22 2044 07/10/22 0002 07/10/22 0346 07/10/22 0735  GLUCAP 251* 312* 208* 90 138*      Recent Results (from the past 240 hour(s))  Gastrointestinal Panel by PCR , Stool     Status:  None   Collection Time: 07/08/22 10:28 PM   Specimen: Stool  Result Value Ref Range Status   Campylobacter species NOT DETECTED NOT DETECTED Final   Plesimonas shigelloides NOT DETECTED NOT DETECTED Final   Salmonella species NOT DETECTED NOT DETECTED Final   Yersinia enterocolitica NOT DETECTED NOT DETECTED Final   Vibrio species NOT DETECTED NOT DETECTED Final   Vibrio cholerae NOT DETECTED NOT DETECTED Final   Enteroaggregative E coli (EAEC) NOT DETECTED NOT DETECTED Final   Enteropathogenic E coli (EPEC) NOT DETECTED NOT DETECTED Final   Enterotoxigenic E coli (ETEC) NOT DETECTED NOT DETECTED Final   Shiga like toxin producing E coli (STEC) NOT DETECTED NOT  DETECTED Final   Shigella/Enteroinvasive E coli (EIEC) NOT DETECTED NOT DETECTED Final   Cryptosporidium NOT DETECTED NOT DETECTED Final   Cyclospora cayetanensis NOT DETECTED NOT DETECTED Final   Entamoeba histolytica NOT DETECTED NOT DETECTED Final   Giardia lamblia NOT DETECTED NOT DETECTED Final   Adenovirus F40/41 NOT DETECTED NOT DETECTED Final   Astrovirus NOT DETECTED NOT DETECTED Final   Norovirus GI/GII NOT DETECTED NOT DETECTED Final   Rotavirus A NOT DETECTED NOT DETECTED Final   Sapovirus (I, II, IV, and V) NOT DETECTED NOT DETECTED Final    Comment: Performed at [facility], [address] [place], [place] [address]  C Difficile Quick Screen w PCR reflex     Status: None   Collection Time: 07/08/22 10:28 PM   Specimen: Stool  Result Value Ref Range Status   C Diff antigen NEGATIVE NEGATIVE Final   C Diff toxin NEGATIVE NEGATIVE Final   C Diff interpretation No C. difficile detected.  Final    Comment: Performed at [facility], [place] [address] [place], [place] [address]         Radiology Studies: No results found.      Scheduled Meds:  acamprosate  666 mg Oral TID WC   aspirin EC  81 mg Oral Daily   atorvastatin  20 mg Oral Daily   Chlorhexidine Gluconate Cloth  6  each Topical Daily   enoxaparin (LOVENOX) injection  60 mg Subcutaneous Q12H   escitalopram  10 mg Oral Daily   folic acid  1 mg Oral Daily   insulin aspart  0-6 Units Subcutaneous Q4H   lipase/protease/amylase  72,000 Units Oral TID WC   LORazepam  0-4 mg Intravenous Q12H   mirtazapine  30 mg Oral QHS   multivitamin with minerals  1 tablet Oral Daily   nicotine  21 mg Transdermal Daily   pantoprazole  40 mg Oral Daily   phosphorus  500 mg Oral BID   pregabalin  75 mg Oral Daily   thiamine  100 mg Oral Daily   Or   thiamine  100 mg Intravenous Daily   Continuous Infusions:     LOS: 3 days    Time spent: 35 mins    Irine Seal, MD [facility]   To contact the attending provider between 7A-7P or the covering provider during after hours 7P-7A, please log into the web site [email or web address] and access using universal [place] password for that web site. If you do not have the password, please call the hospital operator.  07/10/2022, 11:06 AM

## 2022-07-10 NOTE — Progress Notes (Addendum)
ANTICOAGULATION CONSULT NOTE -  follow up  Pharmacy Consult for Heparin Indication:  VTE  Allergies  Allergen Reactions   Bee Venom Anaphylaxis   Lactose Intolerance (Gi) Other (See Comments)    GI upset   Patient Measurements: Height: _0  (180.3 cm) Weight: 63.6 kg (140 lb 3.4 oz) IBW/kg (Calculated) : 75.3 Heparin Dosing Weight: TBW  Vital Signs: Temp: 98.9 F (37.2 C) (11/10 0648) Temp Source: Oral (11/10 0648) BP: 130/83 (11/10 0648) Pulse Rate: 102 (11/10 0648)  Labs: Recent Labs    07/08/22 0542 07/08/22 1023 07/08/22 1857 07/09/22 0448 07/09/22 1319 07/09/22 2258 07/09/22 2359 07/10/22 0716  HGB 7.9*  --   --  7.8*  --   --  7.8*  --   HCT 23.2*  --   --  23.6*  --   --  23.6*  --   PLT 79*  --   --  99*  --   --  113*  --   APTT  --  39*  --   --   --   --   --   --   LABPROT  --  19.2*  --   --   --   --   --   --   INR  --  1.6*  --   --   --   --   --   --   HEPARINUNFRC  --  <0.10*   < > 0.17*   < > >1.10* 0.14* 0.20*  CREATININE 0.39*  --   --  0.45*  --   --  0.42*  --    < > = values in this interval not displayed.     Estimated Creatinine Clearance: 88.3 mL/min (A) (by C-G formula based on SCr of 0.42 mg/dL (L)).   Medical History: Past Medical History:  Diagnosis Date   Barrett's esophagus dx 2016   Bronchitis    Chronic hepatitis C without hepatic coma ([place]) 10/31/2014   Depression    Diabetes ([place])    ED (erectile dysfunction)    GERD (gastroesophageal reflux disease)    Hepatitis C    Hiatal hernia 10/2014   3cm   Jaundice 02/26/2021   Neuropathy    pancreatic ca 12/2020   Pneumonia 05/14/2020   S/P transmetatarsal amputation of foot, left ([place]) 11/28/2020   Sepsis ([place]) 10/28/2020    Medications:  Infusions:   heparin 2,050 Units/hr (07/10/22 0122)   Assessment: 47 yoM admitted with uncontrolled pain, diarrhea, general weakness.  CT concerning for possible cholangitis/abscess vs metastatic disease.  PMH also  significant for pancreatic cancer s/p Whipple, concerning for liver metastasis, Barrett esophagus, chronic hepatitis C, alcohol abuse, LLE osteomyelitis, recent sternal/rib fractures.  Pharmacy is consulted to dose Heparin for MRI + right portal vein thrombosis. No prior to admission anticoagulation.  Heparin was previously ordered for this patient, but was d/c (and never started) due to CT results entered on wrong patient.    Baseline coags: INR elevated at 1.6, APTT 39, HL < 0.1 LFTs:  AST/ALT 37/48, Alk Phos 198, Tbili 2 CBC: Hgb 7.9 (decreased to below baseline ~ 10), Plt 79 (baseline ~ 170s) SCr 0.39  07/10/22 Heparin level remains subtherapeutic 0.2. Hgb = 7.8 (stable), Pltc 113K (stable) No bleeding complications and no interruption in therapy/line issues noted by RN  Goal of Therapy:  Heparin level 0.3-0.7 units/ml Monitor platelets by anticoagulation protocol: Yes   Plan:  Give repeat heparin 2000 units bolus IV x  1 Then increase heparin IV infusion to 2,250 units/hr Recheck heparin level 6 hours after rate increase Daily heparin level and CBC  ADDENDUM:  Since patient has remained subtherapeutic on heparin, we will switch to treatment dose enoxaparin for now.  Plan: Stop heparin infusion Start enoxaparin 14m [place] Q12h Monitor CBC, s/s of bleed  NElenor Quinones PharmD, BCPS, BCIDP Clinical Pharmacist 07/10/2022 11:08 AM

## 2022-07-10 NOTE — TOC Initial Note (Signed)
Transition of Care [facility]) - Initial/Assessment Note    Patient Details  Name: [name] [name] MRN: [id number] Date of Birth: 01-13-1961  Transition of Care [facility]) CM/SW Contact:    Angelita Ingles, RN Phone Number:[phone]  07/10/2022, 3:24 PM  Clinical Narrative:                 [facility] consulted for patient with SNF recommendations , high risk for readmissions and substance abuse. CM at bedside for assessment. Patient states that he is from home with his girlfriend where he normally functions independently. Patient states that he does have a PCP that he follows up with regularly but he is unable to recall physicians name. [facility]acy of choice [facility]. Patient states that he has DME wheelchair at home and cane is noted in the room. Patient reports no home health services. CM reviewed CAGE aid with patient and patient is agreeable to have resources added to AVS. Patient verbalized an understanding of SNF recommendations and states that he understands that he needs to go for a little while but would like to go home on Sunday because he has tickets to the symphony and would like to know if he could go to symphony and come back to the hospital . CM has explained to patient that this is not safe practice and for now he needs to remain in the hospital until [facility] is able to secure a SNF bed for rehab. Patient verbalized understanding and is agreeable .   FL2 completed and info has been faxed for bed offers. PASRR is still pending with request for additional documentation to be sent. CM to fax needed information. PASRR pending. TOC will continue to follow for placement.   Expected Discharge Plan: Skilled Nursing Facility Barriers to Discharge: Ship broker, SNF Pending bed offer, Awaiting State Approval (PASRR)   Patient Goals and CMS Choice Patient states their goals for this hospitalization and ongoing recovery are:: Wants to get better to go home CMS Medicare.gov  Compare Post Acute Care list provided to:: Patient Choice offered to / list presented to : Patient  Expected Discharge Plan and Services Expected Discharge Plan: [place] In-house Referral: NA Discharge Planning Services: CM Consult Post Acute Care Choice: [place] Living arrangements for the past 2 months: Apartment                 DME Arranged: N/A DME Agency: NA       HH Arranged: NA          Prior Living Arrangements/Services Living arrangements for the past 2 months: Apartment Lives with:: Significant Other Patient language and need for interpreter reviewed:: Yes Do you feel safe going back to the place where you live?: Yes      Need for Family Participation in Patient Care: Yes (Comment) Care giver support system in place?: No (comment) Current home services: DME (wheelchair, cane) Criminal Activity/Legal Involvement Pertinent to Current Situation/Hospitalization: No - Comment as needed  Activities of Daily Living Home Assistive Devices/Equipment: Eyeglasses ADL Screening (condition at time of admission) Patient's cognitive ability adequate to safely complete daily activities?: No Is the patient deaf or have difficulty hearing?: No Does the patient have difficulty seeing, even when wearing glasses/contacts?: No Does the patient have difficulty concentrating, remembering, or making decisions?: Yes (keeps falling asleep) Patient able to express need for assistance with ADLs?: Yes Does the patient have difficulty dressing or bathing?: No Independently performs ADLs?: No Communication: Independent Dressing (OT):  Independent Grooming: Independent Feeding: Independent Bathing: Independent Toileting: Needs assistance Is this a change from baseline?: Pre-admission baseline In/Out Bed: Needs assistance Is this a change from baseline?: Pre-admission baseline Walks in Home: Needs assistance Is this a change from baseline?: Pre-admission  baseline Does the patient have difficulty walking or climbing stairs?: Yes Weakness of Legs: Both Weakness of Arms/Hands: Both  Permission Sought/Granted Permission sought to share information with : Family Supports Permission granted to share information with : No              Emotional Assessment Appearance:: Appears stated age Attitude/Demeanor/Rapport: Engaged Affect (typically observed): Accepting, Pleasant, Quiet Orientation: : Oriented to Self, Oriented to Place, Oriented to  Time, Oriented to Situation Alcohol / Substance Use: Alcohol Use Psych Involvement: No (comment)  Admission diagnosis:  Hypokalemia [E87.6] Liver abscess [K75.0] Pulmonary embolism (HCC) [I26.99] Liver lesion, right lobe [K76.9] Patient Active Problem List   Diagnosis Date Noted   Enteritis 07/08/2022   Thrombocytopenia ([place]) 07/08/2022   Normocytic anemia 07/08/2022   Hypomagnesemia 07/08/2022   Portal vein thrombosis 07/08/2022   Diabetes mellitus ([place]) 07/08/2022   Anemia 07/08/2022   Pancreatic cancer metastasized to liver ([place]) 07/08/2022   Liver lesion, right lobe 07/07/2022   Closed fracture of body of sternum 07/04/2022   Diarrhea 06/29/2022   Fall at home, initial encounter 06/29/2022   Medication management 06/01/2022   Smoking 06/01/2022   Protein-calorie malnutrition, severe 01/07/2022   Type 2 diabetes mellitus with ketosis ([place]) 01/05/2022   Pericardial effusion 01/05/2022   Pressure injury of skin 01/05/2022   Abdominal fluid collection 12/19/2021   Encounter for dental examination 09/12/2021   Teeth missing 09/12/2021   [place] 09/12/2021   Defective dental restoration 09/12/2021   Retained tooth root 09/12/2021   Chronic periodontitis 09/12/2021   Accretions on teeth 09/12/2021   Oral mucositis 09/12/2021   Ill-fitting dentures 09/12/2021   Wound of foot 09/10/2021   Closed displaced fracture of proximal phalanx of finger of left hand 06/12/2021   AKI (acute kidney  injury) ([place]) 05/12/2021   Abdominal distension    Gastroparesis    Gastric outlet obstruction 05/05/2021   Abdominal pain 05/05/2021   Dehydration 05/05/2021   Nausea & vomiting 05/04/2021   Port-A-Cath in place 04/24/2021   Malnutrition of moderate degree 03/27/2021   Hyperglycemia due to diabetes mellitus ([place]) 03/26/2021   Pancreatic cancer ([place]) 03/12/2021   Malignant neoplasm of head of pancreas ([place]) 03/12/2021   Loss of weight 02/26/2021   Hypertension associated with type 2 diabetes mellitus ([place]) 11/28/2020   Type 2 diabetes mellitus with diabetic neuropathy, unspecified ([place]) 11/28/2020   Hyperlipidemia associated with type 2 diabetes mellitus ([place]) 11/28/2020   GERD without esophagitis 11/28/2020   Major depression, recurrent, chronic ([place]) 11/28/2020   Aortic atherosclerosis ([place]) 11/28/2020   Diabetic neuropathy ([place]) 11/28/2020   Osteomyelitis ([place]) 11/14/2020   Elevated LFTs    Hypophosphatemia    Depression    Physical deconditioning    Hyponatremia 05/13/2020   Hypokalemia 05/13/2020   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/2017   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   Essential hypertension 04/02/2017   Hypogonadism male 08/13/2016   ETOH abuse 06/22/2016   Low serum testosterone level 01/08/2016   Type 2 diabetes mellitus with other specified complication ([place]) 73/41/9379   Erectile dysfunction 09/12/2015   Chronic hepatitis C without hepatic coma ([place]) 10/31/2014   Tobacco use disorder 08/03/2013   Diabetes type 2, controlled ([place]) 04/27/2013   PCP:  [name], NP Pharmacy:   [facility] [address] [place] [address] Phone: [phone] Fax: [place] [address] Phone: [phone] Fax: [phone]     Social Determinants of Health (SDOH) Interventions    Readmission  Risk Interventions    07/10/2022    3:12 PM 03/27/2021   10:04 AM 11/14/2020   10:11 AM  Readmission Risk Prevention Plan  Transportation Screening Complete Complete Complete  PCP or Specialist Appt within 3-5 Days  Complete   HRI or [place]  Complete Complete  Social Work Consult for [place] Planning/Counseling  Complete Complete  Palliative Care Screening  Not Applicable Not Applicable  Medication Review Press photographer) Referral to Pharmacy Complete Complete  PCP or Specialist appointment within 3-5 days of discharge Complete    HRI or Home Care Consult Complete    SW Recovery Care/Counseling Consult Complete    Palliative Care Screening Not [place] Complete

## 2022-07-10 NOTE — Progress Notes (Addendum)
Palliative Care Progress Note  [name] [name] is much improved today. He is alert, oriented and sitting up in his bed. He is extremely frail, chronic ally ill appearing. He has not required additional dosing of prn meds for anxiety or agitation. He denies any symptoms of withdrawal. He continues to have pain, mostly in his left foot and right upper abdomen. He was agreeable to discuss his goals of care today.  He lives in an apartment, mostly by himself but has a girlfriend and neighbors who check on him and help. He admits that he is scared because he has lost his strength and can't take care of himself -it has been extremely difficult for him to get to the bathroom and sometimes even get OOB. We also discussed the impact of ETOH use on his functional status and overall wellbeing and safety. Discussed his prognosis and liver mets as well as his ongoing infection in his foot and the limitations of chemotherapy or other cancer treatment.  He is followed by community based palliative care and has been seen in our oncology palliative clinic. Previous discussions about Hospice Care have occurred.  Patient Goals:  He wants to return to his apartment. The ability for him to do this safely will depend on his ability have regular care in his home and also his mobility- and accept the risk.  To continue to treat his infection and other reversible medical problems. He has a bucket list and is grieving the loss of his ability to do the things he wanted to do with the time he has left. He has tickets to see [employer] on Sunday and is hopeful he can make it. Continued Pain and Symptom management.  Recommendations:  I strongly recommended hospice and provided education on this-he is fearful hospice means he is at the very end of his life and that it will facilitate his death. He is agreeable to discuss hospice services if they can come to him at his home.Referral to [facility] so they can discuss in more detail  with him. Scheduled Oxycodone Treat infection. Proceed with PT/OT evaluation and mobility assessment-we need to know what he is able to do and what things may be helpful in maintaining his safety. Started Campral for ETOH Use Disorder, may benefit from gabapentin as well. He is high risk for complications related to anticoagulation-due to falls.   Patient approved me calling his sister if necessary but he wants to make his own decisions until he cannot do that anymore.  Lane Hacker, DO Palliative Medicine

## 2022-07-10 NOTE — Evaluation (Signed)
Physical Therapy Evaluation Patient Details Name: [name] [name] MRN: [id number] DOB: 05-13-1961 Today's Date: 07/10/2022  History of Present Illness  61 y.o. male with medical history significant for type 2 diabetes mellitus, Barrett esophagus, chronic hepatitis C, alcohol abuse, poorly healing left TMA with osteomyelitis, recent sternal body fracture, and pancreatic cancer status post [place] with imaging at [facility] in September concerning for liver metastasis, now presenting to the emergency department with uncontrolled pain, generalized weakness and debility, and diarrhea.  Clinical Impression  Pt admitted with above diagnosis. Min A for sit to stand, min A to pivot to recliner, pt unsteady in standing, pt able to stand for ~90 seconds for perineal care as pt was found lying in very large BM. Pt not safe to DC home as he lacks 24* assistance, ST-SNF recommended, pt agreeable.  Pt currently with functional limitations due to the deficits listed below (see PT Problem List). Pt will benefit from skilled PT to increase their independence and safety with mobility to allow discharge to the venue listed below.          Recommendations for follow up therapy are one component of a multi-disciplinary discharge planning process, led by the attending physician.  Recommendations may be updated based on patient status, additional functional criteria and insurance authorization.  Follow Up Recommendations Skilled nursing-short term rehab (<3 hours/day) Can patient physically be transported by private vehicle: No    Assistance Recommended at Discharge Frequent or constant Supervision/Assistance  Patient can return home with the following  A lot of help with walking and/or transfers;A lot of help with bathing/dressing/bathroom;Assistance with cooking/housework;Assist for transportation;Help with stairs or ramp for entrance;Direct supervision/assist for medications management    Equipment Recommendations None  recommended by PT  Recommendations for Other Services       Functional Status Assessment Patient has had a recent decline in their functional status and demonstrates the ability to make significant improvements in function in a reasonable and predictable amount of time.     Precautions / Restrictions Precautions Precautions: Fall Precaution Comments: pt reports he had a fall PTA Restrictions Weight Bearing Restrictions: No      Mobility  Bed Mobility Overal bed mobility: Modified Independent             General bed mobility comments: HOB up, used rail    Transfers Overall transfer level: Needs assistance Equipment used: Rolling walker (2 wheels) Transfers: Sit to/from Stand, Bed to chair/wheelchair/BSC Sit to Stand: Min assist, From elevated surface   Step pivot transfers: Min assist       General transfer comment: assist to power up from elevated bed, min A for balance due to unsteadiness. Pt found to be soiled in very large BM in bed. Able to stand for ~90 seconds for pericare.    Ambulation/Gait                  Stairs            Wheelchair Mobility    Modified Rankin (Stroke Patients Only)       Balance Overall balance assessment: Needs assistance Sitting-balance support: Feet unsupported, No upper extremity supported Sitting balance-Leahy Scale: Fair     Standing balance support: Bilateral upper extremity supported, During functional activity, Reliant on assistive device for balance Standing balance-Leahy Scale: Poor                               Pertinent Vitals/Pain  Pain Assessment Pain Assessment: No/denies pain    Home Living Family/patient expects to be discharged to:: Skilled nursing facility Living Arrangements: Alone Available Help at Discharge: Friend(s);Available PRN/intermittently Type of Home: Apartment         Home Layout: One level   Additional Comments: was living alone PTA, stated girlfriend  assists at times    Prior Function Prior Level of Function : History of Falls (last six months);Patient poor historian/Family not available             Mobility Comments: using WC, had a fall PTA       Hand Dominance        Extremity/Trunk Assessment   Upper Extremity Assessment Upper Extremity Assessment: Defer to OT evaluation    Lower Extremity Assessment Lower Extremity Assessment: Generalized weakness    Cervical / Trunk Assessment Cervical / Trunk Assessment: Normal  Communication   Communication: No difficulties  Cognition Arousal/Alertness: Awake/alert Behavior During Therapy: WFL for tasks assessed/performed Overall Cognitive Status: No family/caregiver present to determine baseline cognitive functioning                                 General Comments: oriented to self, poor historian        General Comments      Exercises     Assessment/Plan    PT Assessment Patient needs continued PT services  PT Problem List Decreased mobility;Decreased activity tolerance;Decreased balance       PT Treatment Interventions DME instruction;Gait training;Therapeutic exercise;Therapeutic activities;Patient/family education;Functional mobility training;Wheelchair mobility training    PT Goals (Current goals can be found in the Care Plan section)  Acute Rehab PT Goals Patient Stated Goal: pt agreeable to ST-SNF PT Goal Formulation: With patient Time For Goal Achievement: 07/24/22 Potential to Achieve Goals: Fair    Frequency Min 2X/week     Co-evaluation               AM-PAC PT "6 Clicks" Mobility  Outcome Measure Help needed turning from your back to your side while in a flat bed without using bedrails?: A Little Help needed moving from lying on your back to sitting on the side of a flat bed without using bedrails?: A Little Help needed moving to and from a bed to a chair (including a wheelchair)?: A Little Help needed standing up  from a chair using your arms (e.g., wheelchair or bedside chair)?: A Lot Help needed to walk in hospital room?: A Lot Help needed climbing 3-5 steps with a railing? : Total 6 Click Score: 14    End of Session Equipment Utilized During Treatment: Gait belt Activity Tolerance: Patient limited by fatigue Patient left: in chair;with chair alarm set;with call bell/phone within reach Nurse Communication: Mobility status PT Visit Diagnosis: Difficulty in walking, not elsewhere classified (R26.2);History of falling (Z91.81)    Time: 2947-6546 PT Time Calculation (min) (ACUTE ONLY): 20 min   Charges:   PT Evaluation $PT Eval Moderate Complexity: 1 Mod         Philomena Doheny PT 07/10/2022  Acute Rehabilitation Services  Office [phone]

## 2022-07-11 DIAGNOSIS — D696 Thrombocytopenia, unspecified: Secondary | ICD-10-CM | POA: Diagnosis not present

## 2022-07-11 DIAGNOSIS — C259 Malignant neoplasm of pancreas, unspecified: Secondary | ICD-10-CM | POA: Diagnosis not present

## 2022-07-11 DIAGNOSIS — I10 Essential (primary) hypertension: Secondary | ICD-10-CM | POA: Diagnosis not present

## 2022-07-11 DIAGNOSIS — K769 Liver disease, unspecified: Secondary | ICD-10-CM | POA: Diagnosis not present

## 2022-07-11 LAB — GLUCOSE, CAPILLARY
Glucose-Capillary: 120 mg/dL — ABNORMAL HIGH (ref 70–99)
Glucose-Capillary: 152 mg/dL — ABNORMAL HIGH (ref 70–99)
Glucose-Capillary: 156 mg/dL — ABNORMAL HIGH (ref 70–99)
Glucose-Capillary: 156 mg/dL — ABNORMAL HIGH (ref 70–99)
Glucose-Capillary: 209 mg/dL — ABNORMAL HIGH (ref 70–99)
Glucose-Capillary: 231 mg/dL — ABNORMAL HIGH (ref 70–99)

## 2022-07-11 LAB — RENAL FUNCTION PANEL
Albumin: 2.4 g/dL — ABNORMAL LOW (ref 3.5–5.0)
Anion gap: 6 (ref 5–15)
BUN: 6 mg/dL (ref 6–20)
CO2: 25 mmol/L (ref 22–32)
Calcium: 7.6 mg/dL — ABNORMAL LOW (ref 8.9–10.3)
Chloride: 104 mmol/L (ref 98–111)
Creatinine, Ser: 0.36 mg/dL — ABNORMAL LOW (ref 0.61–1.24)
GFR, Estimated: >60 mL/min (ref >60)
Glucose, Bld: 131 mg/dL — ABNORMAL HIGH (ref 70–99)
Phosphorus: 2.5 mg/dL (ref 2.5–4.6)
Potassium: 3.5 mmol/L (ref 3.5–5.1)
Sodium: 135 mmol/L (ref 135–145)

## 2022-07-11 LAB — CBC
HCT: 24.2 % — ABNORMAL LOW (ref 39.0–52.0)
Hemoglobin: 8.1 g/dL — ABNORMAL LOW (ref 13.0–17.0)
MCH: 32.4 pg (ref 26.0–34.0)
MCHC: 33.5 g/dL (ref 30.0–36.0)
MCV: 96.8 fL (ref 80.0–100.0)
Platelets: 109 10*3/uL — ABNORMAL LOW (ref 150–400)
RBC: 2.5 MIL/uL — ABNORMAL LOW (ref 4.22–5.81)
RDW: 17.2 % — ABNORMAL HIGH (ref 11.5–15.5)
WBC: 6.6 10*3/uL (ref 4.0–10.5)
nRBC: 0 % (ref 0.0–0.2)

## 2022-07-11 LAB — MAGNESIUM: Magnesium: 1.7 mg/dL (ref 1.7–2.4)

## 2022-07-11 MED ORDER — POTASSIUM CHLORIDE CRYS ER 10 MEQ PO TBCR
40.0000 meq | EXTENDED_RELEASE_TABLET | Freq: Once | ORAL | Status: AC
  Administered 2022-07-11: 40 meq via ORAL
  Filled 2022-07-11: qty 4

## 2022-07-11 MED ORDER — BENZONATATE 100 MG PO CAPS: 100.0000 mg | ORAL_CAPSULE | Freq: Three times a day (TID) | ORAL | Status: DC | PRN

## 2022-07-11 NOTE — Progress Notes (Signed)
PROGRESS NOTE    ZACHARIA SOWLES  ZJQ:[id number] DOB: [birth year] DOA: 07/07/2022 PCP: [name], NP    Chief Complaint  Patient presents with   Weakness    Brief Narrative:  HPI per Dr. Clinton Quant is a 61 y.o. male with medical history significant for type 2 diabetes mellitus, Barrett esophagus, chronic hepatitis C, alcohol abuse, poorly healing left TMA with osteomyelitis, recent sternal body fracture, and pancreatic cancer status post Whipple with imaging at [facility] in September concerning for liver metastasis, now presenting to the emergency department with uncontrolled pain, generalized weakness and debility, and diarrhea.   Patient reports that he has been having approximately 4 loose stools every day and has had difficulty controlling his sternal and abdominal pain with his home medications.  He also complains of progressive general weakness, now with great difficulty transferring to and from his wheelchair.  He has not noted any subjective fever or chills.    ED Course: Upon arrival to the ED, patient is found to be afebrile and saturating well on room air with mild tachycardia and stable blood pressure.  He is in sinus or ectopic atrial tachycardia with rate 104 on EKG.  CTA chest is negative for PE but notable for small bilateral pleural effusions and unchanged sternal fracture and pulmonary nodule.  CT of the abdomen pelvis is notable for new 5.5 cm right liver lobe lesion which is likely metastatic but CBD enhancement is concerning for cholangitis with abscess.  Labs notable for glucose 289, potassium 3.0, alkaline phosphatase 234, albumin 1.9, AST 59, AST 63, total bilirubin 2.0, hemoglobin 8.8, and platelets 98,000.   Patient was treated with Dilaudid, Zofran, oral potassium, and a liter of normal saline in the ED.    Assessment & Plan:   Principal Problem:   Liver lesion, right lobe Active Problems:   Chronic hepatitis C without hepatic coma (HCC)   Type 2  diabetes mellitus with other specified complication (HCC)   ETOH abuse   Essential hypertension   Hypokalemia   Pancreatic cancer (HCC)   Wound of foot   Closed fracture of body of sternum   Enteritis   Thrombocytopenia (HCC)   Normocytic anemia   Hypomagnesemia   Portal vein thrombosis   Diabetes mellitus (HCC)   Anemia   Pancreatic cancer metastasized to liver ([place])  #1 liver lesion -New 5.5 cm right lobe liver lesion noted on CT favored by radiologist to reflect metastatic disease but CBD enhancement raises concern for possible cholangitis versus abscess. -Patient started empirically on IV antibiotics currently on IV Zosyn. -MRI abdomen done with a 6.1 x 5.1 cm irregular heterogeneous enhancing mass lesion in the dome of the posterior right liver compatible with metastatic disease.  Appears to be thrombus in the right portal vein branch inferior to the lesion.  Additionally 1.6 cm and 0.7 cm hepatic lesions restricted diffusion and also suspicious for metastatic disease.  Sequela of Whipple procedure.  Small to moderate volume ascites with diffuse mesenteric and soft tissue edema.  Small portacaval and retrocaval lymph nodes evident. -Discontinued IV antibiotics. -Patient's primary oncologist, Dr. Lorenso Courier informed of admission via epic. -Oncology has assessed the patient, due to patient's poor functional status, recent infection of the foot few patient is a poor systemic candidate and feel patient is hospice appropriate. -Palliative care consulted and following.   2.  Thrombus within right portal vein branch inferior to the lesion -Patient hypercoagulable state secondary to metastatic cancer. -On IV heparin and  has been transitioned to full dose Lovenox and if continued improvement could likely transition to oral DOAC in the next 1 to 2 days.    3.  Enteritis -Patient states has been having soft mushy stools. -Patient with electrolyte abnormalities. -C. difficile PCR negative. -GI  pathogen panel negative. -Continue pancreatic enzymes. -No further work-up needed at this time.  4.  Pancreatic cancer -Diagnosed June 2022, status post FOLFIRINOX from August 2022-January 2023, status post Whipple February 2023, chemoradiation June-August 2023. -CT at [facility] in September confirms metastatic disease to the liver. -Further treatment on hold due to left foot infection. -Oncology informed of admission and following patient and feel due to poor functional status open wound on his foot patient a poor candidate for systemic therapy at this time and feels hospice may be appropriate.   -Continue pancreatic enzymes/Creon.  -Palliative care following.  5.  Anemia/thrombocytopenia -Patient with no overt bleeding.  Hemoglobin currently at 8.1 from 7.5 from 7.8 from 7.9 from 8.8 on admission.  Platelet count of 109 from 114 from 113 from 99 from 79 from 98 on admission. -Patient on IV heparin for thrombus within the right portal vein branch monitor closely. -Hematology/oncology informed of admission via epic and has assessed the patient.    6.  Sternal fracture -Patient noted to have had a fall 2010/31 and suffered a sternal body fracture. -Sternal fracture unchanged.   -Continue current pain management.  7.  Left foot osteomyelitis -Status post TMA. -Status post 6 weeks of vancomycin and Levaquin 07/01/2022. -Continue podiatry and ID follow-up in the outpatient settings. -Sutures removed.    8.  Hypokalemia/hypomagnesemia/hypophosphatemia -Likely secondary to metastatic disease. -Status post K-Phos 30 mmol IV x1. -Phosphorus at 2.5. -Continue oral phosphorus tablets to 500 twice daily for the next  2-3 days.   -Magnesium at 1.7. -Potassium repleted currently at 3.5. -K-Dur 40 mEq p.o. x1. -Repeat labs in the AM.  9.  Insulin-dependent diabetes mellitus -Hemoglobin A1c 8.06 April 2022. -Repeat hemoglobin A1c 5.1. -CBG 152 this morning. -SSI, continue home regimen  Lyrica.  10.  Alcohol abuse -Patient with no overt withdrawal symptoms. -Continue the Ativan CIWA protocol, thiamine, folic acid, multivitamin  11.  Debility -Patient with complaints of progressive generalized weakness difficulty transferring to and from wheelchair. -PT/OT has assessed patient and recommended SNF placement. -Patient in agreement with SNF placement.    DVT prophylaxis: Heparin>>> Lovenox Code Status: DNR Family Communication: Updated patient.  No family at bedside. Disposition: TBD  Status is: Inpatient The patient will require care spanning > 2 midnights and should be moved to inpatient because: Severity of illness, unsafe disposition.   Consultants:  Palliative care: Dr. Hilma Favors 07/08/2022 Oncology: Dr. Lorenso Courier 07/09/2022  Procedures:  CT abdomen and pelvis 07/07/2022 CT angiogram chest 07/07/2022 MRI abdomen 07/08/2022  Antimicrobials:  IV cefepime 07/07/2022>>> 07/08/2022 IV Flagyl 07/07/2022>>>> 07/08/2022 IV Zosyn 07/08/2022>>>>> 07/08/2022   Subjective: Sleeping but arousable.  States he is tired.  Still with generalized weakness.  No change in chronic lower abdominal pain and sternal chest pain.  Denies any bleeding.  Tmax 99.2.  Objective: Vitals:   07/10/22 0648 07/10/22 1358 07/10/22 2016 07/11/22 0427  BP: 130/83 124/74 (!) 150/94 134/80  Pulse: (!) 102 91 93 95  Resp: '17 17 16 16  '$ Temp: 98.9 F (37.2 C) 98.2 F (36.8 C) 98.8 F (37.1 C) 98.3 F (36.8 C)  TempSrc: Oral Oral Oral Oral  SpO2: 91% 96% 93% 91%  Weight:    63 kg  Height:  Intake/Output Summary (Last 24 hours) at 07/11/2022 1125 Last data filed at 07/11/2022 0434 Gross per 24 hour  Intake 480 ml  Output 1000 ml  Net -520 ml    Filed Weights   07/09/22 0404 07/10/22 0400 07/11/22 0427  Weight: 62.3 kg 63.6 kg 63 kg    Examination:  General exam: NAD Respiratory system: CTA B.  No wheezes, no crackles, no rhonchi.  Fair air movement.  Speaking in full sentences.    Cardiovascular system: RRR no murmurs rubs or gallops.  No JVD.  No lower extremity edema.  Gastrointestinal system: Abdomen soft, nondistended, some tenderness to palpation left lower quadrant (chronic). Positive bowel sounds.  No rebound.  No guarding.  Central nervous system: Alert and oriented. No focal neurological deficits. Extremities: Status post left transmetatarsal amputation incision site c/d/I.  symmetric 5 x 5 power. Skin: No rashes, lesions or ulcers Psychiatry: Judgement and insight appear normal. Mood & affect appropriate.     Data Reviewed: I have personally reviewed following labs and imaging studies  CBC: Recent Labs  Lab 07/07/22 1757 07/08/22 0542 07/09/22 0448 07/09/22 2359 07/10/22 0716 07/11/22 0616  WBC 8.3 6.9 7.7 7.0 7.2 6.6  NEUTROABS 6.2  --   --   --  5.5  --   HGB 8.8* 7.9* 7.8* 7.8* 7.5* 8.1*  HCT 26.6* 23.2* 23.6* 23.6* 21.5* 24.2*  MCV 98.5 96.7 97.1 98.7 95.6 96.8  PLT 98* 79* 99* 113* 114* 109*     Basic Metabolic Panel: Recent Labs  Lab 07/08/22 0542 07/09/22 0448 07/09/22 2359 07/10/22 0716 07/11/22 0616 07/11/22 0617  NA 135 132* 133* 134*  --  135  K 3.4* 4.0 3.6 3.6  --  3.5  CL 106 105 105 105  --  104  CO2 '26 24 25 24  '$ --  25  GLUCOSE 220* 160* 206* 132*  --  131*  BUN <5* 5* 6 <5*  --  6  CREATININE 0.39* 0.45* 0.42* 0.38*  --  0.36*  CALCIUM 7.1* 7.1* 7.6* 7.6*  --  7.6*  MG 1.5* 1.8 2.2 1.9 1.7  --   PHOS 1.9* 2.2*  --  2.3*  --  2.5     GFR: Estimated Creatinine Clearance: 87.5 mL/min (A) (by C-G formula based on SCr of 0.36 mg/dL (L)).  Liver Function Tests: Recent Labs  Lab 07/07/22 1757 07/08/22 0542 07/09/22 0448 07/09/22 2359 07/10/22 0716 07/11/22 0617  AST 59* 37 35 30  --   --   ALT 63* 48* 37 30  --   --   ALKPHOS 234* 198* 193* 170*  --   --   BILITOT 2.0* 2.0* 1.4* 1.3*  --   --   PROT 5.5* 4.6* 4.8* 5.7*  --   --   ALBUMIN 1.9* 1.6* 1.5* 2.9* 2.7* 2.4*     CBG: Recent Labs  Lab  07/10/22 1609 07/10/22 2017 07/10/22 2344 07/11/22 0430 07/11/22 0732  GLUCAP 167* 204* 210* 120* 152*      Recent Results (from the past 240 hour(s))  Gastrointestinal Panel by PCR , Stool     Status: None   Collection Time: 07/08/22 10:28 PM   Specimen: Stool  Result Value Ref Range Status   Campylobacter species NOT DETECTED NOT DETECTED Final   Plesimonas shigelloides NOT DETECTED NOT DETECTED Final   Salmonella species NOT DETECTED NOT DETECTED Final   Yersinia enterocolitica NOT DETECTED NOT DETECTED Final   Vibrio species NOT DETECTED NOT DETECTED Final  Vibrio cholerae NOT DETECTED NOT DETECTED Final   Enteroaggregative E coli (EAEC) NOT DETECTED NOT DETECTED Final   Enteropathogenic E coli (EPEC) NOT DETECTED NOT DETECTED Final   Enterotoxigenic E coli (ETEC) NOT DETECTED NOT DETECTED Final   Shiga like toxin producing E coli (STEC) NOT DETECTED NOT DETECTED Final   Shigella/Enteroinvasive E coli (EIEC) NOT DETECTED NOT DETECTED Final   Cryptosporidium NOT DETECTED NOT DETECTED Final   Cyclospora cayetanensis NOT DETECTED NOT DETECTED Final   Entamoeba histolytica NOT DETECTED NOT DETECTED Final   Giardia lamblia NOT DETECTED NOT DETECTED Final   Adenovirus F40/41 NOT DETECTED NOT DETECTED Final   Astrovirus NOT DETECTED NOT DETECTED Final   Norovirus GI/GII NOT DETECTED NOT DETECTED Final   Rotavirus A NOT DETECTED NOT DETECTED Final   Sapovirus (I, II, IV, and V) NOT DETECTED NOT DETECTED Final    Comment: Performed at [facility], [address], [place], [place] [address]  C Difficile Quick Screen w PCR reflex     Status: None   Collection Time: 07/08/22 10:28 PM   Specimen: Stool  Result Value Ref Range Status   C Diff antigen NEGATIVE NEGATIVE Final   C Diff toxin NEGATIVE NEGATIVE Final   C Diff interpretation No C. difficile detected.  Final    Comment: Performed at [facility], [place] [address], [place], [place] [address]          Radiology Studies: DG CHEST PORT 1 VIEW  Result Date: 07/10/2022 CLINICAL DATA:  Fever. EXAM: PORTABLE CHEST 1 VIEW COMPARISON:  June 30, 2022. FINDINGS: The heart size and mediastinal contours are within normal limits. Hypoinflation of the lungs is noted with mild bibasilar subsegmental atelectasis and small bilateral pleural effusions. Right internal jugular Port-A-Cath is unchanged in position. The visualized skeletal structures are unremarkable. IMPRESSION: Hypoinflation of the lungs is noted with mild bibasilar subsegmental atelectasis and small pleural effusions. Electronically Signed   By: Marijo Conception M.D.   On: 07/10/2022 11:47        Scheduled Meds:  acamprosate  666 mg Oral TID WC   aspirin EC  81 mg Oral Daily   atorvastatin  20 mg Oral Daily   Chlorhexidine Gluconate Cloth  6 each Topical Daily   enoxaparin (LOVENOX) injection  60 mg Subcutaneous Q12H   escitalopram  10 mg Oral Daily   folic acid  1 mg Oral Daily   insulin aspart  0-6 Units Subcutaneous Q4H   lipase/protease/amylase  72,000 Units Oral TID WC   LORazepam  0-4 mg Intravenous Q12H   mirtazapine  30 mg Oral QHS   multivitamin with minerals  1 tablet Oral Daily   nicotine  21 mg Transdermal Daily   pantoprazole  40 mg Oral Daily   phosphorus  500 mg Oral BID   pregabalin  75 mg Oral Daily   thiamine  100 mg Oral Daily   Or   thiamine  100 mg Intravenous Daily   Continuous Infusions:     LOS: 4 days    Time spent: 35 mins    Irine Seal, MD [facility]   To contact the attending provider between 7A-7P or the covering provider during after hours 7P-7A, please log into the web site [email or web address] and access using universal [place] password for that web site. If you do not have the password, please call the hospital operator.  07/11/2022, 11:25 AM

## 2022-07-12 DIAGNOSIS — K769 Liver disease, unspecified: Secondary | ICD-10-CM | POA: Diagnosis not present

## 2022-07-12 DIAGNOSIS — C259 Malignant neoplasm of pancreas, unspecified: Secondary | ICD-10-CM | POA: Diagnosis not present

## 2022-07-12 DIAGNOSIS — I10 Essential (primary) hypertension: Secondary | ICD-10-CM | POA: Diagnosis not present

## 2022-07-12 DIAGNOSIS — D696 Thrombocytopenia, unspecified: Secondary | ICD-10-CM | POA: Diagnosis not present

## 2022-07-12 LAB — RENAL FUNCTION PANEL
Albumin: 2.3 g/dL — ABNORMAL LOW (ref 3.5–5.0)
Anion gap: 7 (ref 5–15)
BUN: 7 mg/dL (ref 6–20)
CO2: 23 mmol/L (ref 22–32)
Calcium: 7.4 mg/dL — ABNORMAL LOW (ref 8.9–10.3)
Chloride: 104 mmol/L (ref 98–111)
Creatinine, Ser: 0.35 mg/dL — ABNORMAL LOW (ref 0.61–1.24)
GFR, Estimated: >60 mL/min (ref >60)
Glucose, Bld: 132 mg/dL — ABNORMAL HIGH (ref 70–99)
Phosphorus: 2.8 mg/dL (ref 2.5–4.6)
Potassium: 3.8 mmol/L (ref 3.5–5.1)
Sodium: 134 mmol/L — ABNORMAL LOW (ref 135–145)

## 2022-07-12 LAB — CBC
HCT: 24.5 % — ABNORMAL LOW (ref 39.0–52.0)
Hemoglobin: 8.4 g/dL — ABNORMAL LOW (ref 13.0–17.0)
MCH: 33.1 pg (ref 26.0–34.0)
MCHC: 34.3 g/dL (ref 30.0–36.0)
MCV: 96.5 fL (ref 80.0–100.0)
Platelets: 109 10*3/uL — ABNORMAL LOW (ref 150–400)
RBC: 2.54 MIL/uL — ABNORMAL LOW (ref 4.22–5.81)
RDW: 17.4 % — ABNORMAL HIGH (ref 11.5–15.5)
WBC: 8 10*3/uL (ref 4.0–10.5)
nRBC: 0 % (ref 0.0–0.2)

## 2022-07-12 LAB — GLUCOSE, CAPILLARY
Glucose-Capillary: 110 mg/dL — ABNORMAL HIGH (ref 70–99)
Glucose-Capillary: 161 mg/dL — ABNORMAL HIGH (ref 70–99)
Glucose-Capillary: 207 mg/dL — ABNORMAL HIGH (ref 70–99)
Glucose-Capillary: 220 mg/dL — ABNORMAL HIGH (ref 70–99)
Glucose-Capillary: 250 mg/dL — ABNORMAL HIGH (ref 70–99)
Glucose-Capillary: 160 mg/dL — ABNORMAL HIGH (ref 70–99)
Glucose-Capillary: 129 mg/dL — ABNORMAL HIGH (ref 70–99)

## 2022-07-12 LAB — MAGNESIUM: Magnesium: 1.7 mg/dL (ref 1.7–2.4)

## 2022-07-12 MED ORDER — APIXABAN 5 MG PO TABS
10.0000 mg | ORAL_TABLET | Freq: Two times a day (BID) | ORAL | Status: DC
  Administered 2022-07-12 – 2022-07-17 (×10): 10 mg via ORAL
  Filled 2022-07-12 (×10): qty 2

## 2022-07-12 MED ORDER — ENSURE ENLIVE PO LIQD
237.0000 mL | Freq: Three times a day (TID) | ORAL | Status: DC
  Administered 2022-07-12 – 2022-07-17 (×13): 237 mL via ORAL

## 2022-07-12 MED ORDER — APIXABAN 5 MG PO TABS: 5.0000 mg | ORAL_TABLET | Freq: Two times a day (BID) | ORAL | Status: DC

## 2022-07-12 MED ORDER — PROSOURCE PLUS PO LIQD
30.0000 mL | Freq: Two times a day (BID) | ORAL | Status: DC
  Administered 2022-07-13 – 2022-07-17 (×8): 30 mL via ORAL
  Filled 2022-07-12 (×8): qty 30

## 2022-07-12 NOTE — Progress Notes (Signed)
PROGRESS NOTE    [name] [name]  HYQ:[id number] DOB: [birth year] DOA: 07/07/2022 PCP: [name], NP    Chief Complaint  Patient presents with   Weakness    Brief Narrative:  HPI per Dr. Clinton Quant is a 61 y.o. male with medical history significant for type 2 diabetes mellitus, Barrett esophagus, chronic hepatitis C, alcohol abuse, poorly healing left TMA with osteomyelitis, recent sternal body fracture, and pancreatic cancer status post Whipple with imaging at [facility] in September concerning for liver metastasis, now presenting to the emergency department with uncontrolled pain, generalized weakness and debility, and diarrhea.   Patient reports that he has been having approximately 4 loose stools every day and has had difficulty controlling his sternal and abdominal pain with his home medications.  He also complains of progressive general weakness, now with great difficulty transferring to and from his wheelchair.  He has not noted any subjective fever or chills.    ED Course: Upon arrival to the ED, patient is found to be afebrile and saturating well on room air with mild tachycardia and stable blood pressure.  He is in sinus or ectopic atrial tachycardia with rate 104 on EKG.  CTA chest is negative for PE but notable for small bilateral pleural effusions and unchanged sternal fracture and pulmonary nodule.  CT of the abdomen pelvis is notable for new 5.5 cm right liver lobe lesion which is likely metastatic but CBD enhancement is concerning for cholangitis with abscess.  Labs notable for glucose 289, potassium 3.0, alkaline phosphatase 234, albumin 1.9, AST 59, AST 63, total bilirubin 2.0, hemoglobin 8.8, and platelets 98,000.   Patient was treated with Dilaudid, Zofran, oral potassium, and a liter of normal saline in the ED.    Assessment & Plan:   Principal Problem:   Liver lesion, right lobe Active Problems:   Chronic hepatitis C without hepatic coma (HCC)   Type 2  diabetes mellitus with other specified complication (HCC)   ETOH abuse   Essential hypertension   Hypokalemia   Pancreatic cancer (HCC)   Wound of foot   Closed fracture of body of sternum   Enteritis   Thrombocytopenia (HCC)   Normocytic anemia   Hypomagnesemia   Portal vein thrombosis   Diabetes mellitus (HCC)   Anemia   Pancreatic cancer metastasized to liver ([place])  #1 liver lesion -New 5.5 cm right lobe liver lesion noted on CT favored by radiologist to reflect metastatic disease but CBD enhancement raises concern for possible cholangitis versus abscess. -Patient started empirically on IV antibiotics currently on IV Zosyn. -MRI abdomen done with a 6.1 x 5.1 cm irregular heterogeneous enhancing mass lesion in the dome of the posterior right liver compatible with metastatic disease.  Appears to be thrombus in the right portal vein branch inferior to the lesion.  Additionally 1.6 cm and 0.7 cm hepatic lesions restricted diffusion and also suspicious for metastatic disease.  Sequela of Whipple procedure.  Small to moderate volume ascites with diffuse mesenteric and soft tissue edema.  Small portacaval and retrocaval lymph nodes evident. -Discontinued IV antibiotics. -Patient's primary oncologist, Dr. Lorenso Courier informed of admission via epic. -Oncology has assessed the patient, due to patient's poor functional status, recent infection of the foot feel patient is a poor systemic candidate and feel patient is hospice appropriate. -Palliative care consulted and following.   2.  Thrombus within right portal vein branch inferior to the lesion -Patient hypercoagulable state secondary to metastatic cancer. -On IV heparin and  has been transitioned to full dose Lovenox and will transition to Eliquis.    3.  Enteritis -Patient states has been having soft mushy stools. -Patient with electrolyte abnormalities. -C. difficile PCR negative. -GI pathogen panel negative. -Continue pancreatic  enzymes. -No further work-up needed at this time.  4.  Pancreatic cancer -Diagnosed June 2022, status post FOLFIRINOX from August 2022-January 2023, status post Whipple February 2023, chemoradiation June-August 2023. -CT at [facility] in September confirms metastatic disease to the liver. -Further treatment on hold due to left foot infection. -Oncology informed of admission and following patient and feel due to poor functional status open wound on his foot patient a poor candidate for systemic therapy at this time and feels hospice may be appropriate.   -Continue pancreatic enzymes/Creon.  -Oncology following. -Palliative care following.  5.  Anemia/thrombocytopenia -Patient with no overt bleeding.  Hemoglobin currently at 8.4 from 8.1 from 7.5 from 7.8 from 7.9 from 8.8 on admission.  Platelet count of 109 from 109 from 114 from 113 from 99 from 79 from 98 on admission. -Patient was on IV heparin for thrombus within the right portal vein branch and has been transitioned to Lovenox, monitor closely. -Hematology/oncology informed of admission via epic and has assessed the patient.    6.  Sternal fracture -Patient noted to have had a fall 2010/31 and suffered a sternal body fracture. -Sternal fracture unchanged.   -Continue current pain management.  7.  Left foot osteomyelitis -Status post TMA. -Status post 6 weeks of vancomycin and Levaquin 07/01/2022. -Continue podiatry and ID follow-up in the outpatient settings. -Sutures removed.    8.  Hypokalemia/hypomagnesemia/hypophosphatemia -Likely secondary to metastatic disease. -Status post K-Phos 30 mmol IV x1. -Phosphorus at 2.8. -Continue oral phosphorus tablets to 500 twice daily. -Magnesium at 1.7. -Potassium repleted currently at 3.8. -Repeat labs in the AM.  9.  Insulin-dependent diabetes mellitus -Hemoglobin A1c 8.06 April 2022. -Repeat hemoglobin A1c 5.1. -CBG 161 this morning. -SSI, continue home regimen Lyrica.  10.  Alcohol  abuse -Patient with no overt withdrawal symptoms. -Continue the Ativan CIWA protocol, thiamine, folic acid, multivitamin  11.  Debility -Patient with complaints of progressive generalized weakness difficulty transferring to and from wheelchair. -PT/OT has assessed patient and recommended SNF placement. -Patient in agreement with SNF placement. -TOC consulted for SNF placement.    DVT prophylaxis: Heparin>>> Lovenox Code Status: DNR Family Communication: Updated patient.  No family at bedside. Disposition: SNF  Status is: Inpatient The patient will require care spanning > 2 midnights and should be moved to inpatient because: Severity of illness, unsafe disposition.   Consultants:  Palliative care: Dr. Hilma Favors 07/08/2022 Oncology: Dr. Lorenso Courier 07/09/2022  Procedures:  CT abdomen and pelvis 07/07/2022 CT angiogram chest 07/07/2022 MRI abdomen 07/08/2022  Antimicrobials:  IV cefepime 07/07/2022>>> 07/08/2022 IV Flagyl 07/07/2022>>>> 07/08/2022 IV Zosyn 07/08/2022>>>>> 07/08/2022   Subjective: Sitting up in chair brushing teeth.  Still with generalized weakness overall.  Denies any chest pain.  No shortness of breath.  Still with chronic lower abdominal pain.  No bleeding.   Objective: Vitals:   07/11/22 0427 07/11/22 1442 07/11/22 2204 07/12/22 0329  BP: 134/80 132/88 130/78 (!) 142/86  Pulse: 95 95 (!) 107 (!) 107  Resp: '16 18 18 18  '$ Temp: 98.3 F (36.8 C) 99.2 F (37.3 C) 100.3 F (37.9 C) 100.2 F (37.9 C)  TempSrc: Oral Oral Oral Oral  SpO2: 91% 94% 95% 95%  Weight: 63 kg     Height:  Intake/Output Summary (Last 24 hours) at 07/12/2022 1044 Last data filed at 07/12/2022 0856 Gross per 24 hour  Intake 940 ml  Output 2250 ml  Net -1310 ml    Filed Weights   07/09/22 0404 07/10/22 0400 07/11/22 0427  Weight: 62.3 kg 63.6 kg 63 kg    Examination:  General exam: NAD Respiratory system: CTA B.  No wheezes, no crackles, no rhonchi.  Fair air movement.   Speaking in full sentences.  Cardiovascular system: Regular rate rhythm no murmurs rubs or gallops.  No JVD.  No lower extremity edema.  Gastrointestinal system: Abdomen is soft, nondistended, some tenderness to palpation left lower quadrant (chronic). Positive bowel sounds.  No rebound.  No guarding.  Central nervous system: Alert and oriented. No focal neurological deficits. Extremities: Status post left transmetatarsal amputation incision site c/d/I.  symmetric 5 x 5 power. Skin: No rashes, lesions or ulcers Psychiatry: Judgement and insight appear normal. Mood & affect appropriate.     Data Reviewed: I have personally reviewed following labs and imaging studies  CBC: Recent Labs  Lab 07/07/22 1757 07/08/22 0542 07/09/22 0448 07/09/22 2359 07/10/22 0716 07/11/22 0616 07/12/22 0544  WBC 8.3   < > 7.7 7.0 7.2 6.6 8.0  NEUTROABS 6.2  --   --   --  5.5  --   --   HGB 8.8*   < > 7.8* 7.8* 7.5* 8.1* 8.4*  HCT 26.6*   < > 23.6* 23.6* 21.5* 24.2* 24.5*  MCV 98.5   < > 97.1 98.7 95.6 96.8 96.5  PLT 98*   < > 99* 113* 114* 109* 109*   < > = values in this interval not displayed.     Basic Metabolic Panel: Recent Labs  Lab 07/08/22 0542 07/09/22 0448 07/09/22 2359 07/10/22 0716 07/11/22 0616 07/11/22 0617 07/12/22 0544 07/12/22 0545  NA 135 132* 133* 134*  --  135  --  134*  K 3.4* 4.0 3.6 3.6  --  3.5  --  3.8  CL 106 105 105 105  --  104  --  104  CO2 '26 24 25 24  '$ --  25  --  23  GLUCOSE 220* 160* 206* 132*  --  131*  --  132*  BUN <5* 5* 6 <5*  --  6  --  7  CREATININE 0.39* 0.45* 0.42* 0.38*  --  0.36*  --  0.35*  CALCIUM 7.1* 7.1* 7.6* 7.6*  --  7.6*  --  7.4*  MG 1.5* 1.8 2.2 1.9 1.7  --  1.7  --   PHOS 1.9* 2.2*  --  2.3*  --  2.5  --  2.8     GFR: Estimated Creatinine Clearance: 87.5 mL/min (A) (by C-G formula based on SCr of 0.35 mg/dL (L)).  Liver Function Tests: Recent Labs  Lab 07/07/22 1757 07/08/22 0542 07/09/22 0448 07/09/22 2359 07/10/22 0716  07/11/22 0617 07/12/22 0545  AST 59* 37 35 30  --   --   --   ALT 63* 48* 37 30  --   --   --   ALKPHOS 234* 198* 193* 170*  --   --   --   BILITOT 2.0* 2.0* 1.4* 1.3*  --   --   --   PROT 5.5* 4.6* 4.8* 5.7*  --   --   --   ALBUMIN 1.9* 1.6* 1.5* 2.9* 2.7* 2.4* 2.3*     CBG: Recent Labs  Lab 07/11/22 1626 07/11/22 1938  07/11/22 2353 07/12/22 0331 07/12/22 0721  GLUCAP 231* 156* 156* 110* 161*      Recent Results (from the past 240 hour(s))  Gastrointestinal Panel by PCR , Stool     Status: None   Collection Time: 07/08/22 10:28 PM   Specimen: Stool  Result Value Ref Range Status   Campylobacter species NOT DETECTED NOT DETECTED Final   Plesimonas shigelloides NOT DETECTED NOT DETECTED Final   Salmonella species NOT DETECTED NOT DETECTED Final   Yersinia enterocolitica NOT DETECTED NOT DETECTED Final   Vibrio species NOT DETECTED NOT DETECTED Final   Vibrio cholerae NOT DETECTED NOT DETECTED Final   Enteroaggregative E coli (EAEC) NOT DETECTED NOT DETECTED Final   Enteropathogenic E coli (EPEC) NOT DETECTED NOT DETECTED Final   Enterotoxigenic E coli (ETEC) NOT DETECTED NOT DETECTED Final   Shiga like toxin producing E coli (STEC) NOT DETECTED NOT DETECTED Final   Shigella/Enteroinvasive E coli (EIEC) NOT DETECTED NOT DETECTED Final   Cryptosporidium NOT DETECTED NOT DETECTED Final   Cyclospora cayetanensis NOT DETECTED NOT DETECTED Final   Entamoeba histolytica NOT DETECTED NOT DETECTED Final   Giardia lamblia NOT DETECTED NOT DETECTED Final   Adenovirus F40/41 NOT DETECTED NOT DETECTED Final   Astrovirus NOT DETECTED NOT DETECTED Final   Norovirus GI/GII NOT DETECTED NOT DETECTED Final   Rotavirus A NOT DETECTED NOT DETECTED Final   Sapovirus (I, II, IV, and V) NOT DETECTED NOT DETECTED Final    Comment: Performed at [facility], [address] [place], [place] [address]  C Difficile Quick Screen w PCR reflex     Status: None   Collection Time:  07/08/22 10:28 PM   Specimen: Stool  Result Value Ref Range Status   C Diff antigen NEGATIVE NEGATIVE Final   C Diff toxin NEGATIVE NEGATIVE Final   C Diff interpretation No C. difficile detected.  Final    Comment: Performed at [facility], [place] [address], [place], [place] [address]         Radiology Studies: DG CHEST PORT 1 VIEW  Result Date: 07/10/2022 CLINICAL DATA:  Fever. EXAM: PORTABLE CHEST 1 VIEW COMPARISON:  June 30, 2022. FINDINGS: The heart size and mediastinal contours are within normal limits. Hypoinflation of the lungs is noted with mild bibasilar subsegmental atelectasis and small bilateral pleural effusions. Right internal jugular Port-A-Cath is unchanged in position. The visualized skeletal structures are unremarkable. IMPRESSION: Hypoinflation of the lungs is noted with mild bibasilar subsegmental atelectasis and small pleural effusions. Electronically Signed   By: Marijo Conception M.D.   On: 07/10/2022 11:47        Scheduled Meds:  acamprosate  666 mg Oral TID WC   aspirin EC  81 mg Oral Daily   atorvastatin  20 mg Oral Daily   Chlorhexidine Gluconate Cloth  6 each Topical Daily   enoxaparin (LOVENOX) injection  60 mg Subcutaneous Q12H   escitalopram  10 mg Oral Daily   folic acid  1 mg Oral Daily   insulin aspart  0-6 Units Subcutaneous Q4H   lipase/protease/amylase  72,000 Units Oral TID WC   mirtazapine  30 mg Oral QHS   multivitamin with minerals  1 tablet Oral Daily   nicotine  21 mg Transdermal Daily   pantoprazole  40 mg Oral Daily   phosphorus  500 mg Oral BID   pregabalin  75 mg Oral Daily   thiamine  100 mg Oral Daily   Or   thiamine  100 mg Intravenous Daily   Continuous Infusions:     LOS: 5 days    Time spent: 35 mins    Irine Seal, MD [facility]   To contact the attending provider between 7A-7P or the covering provider during after hours 7P-7A, please log into the web site [email or web address] and  access using universal [place] password for that web site. If you do not have the password, please call the hospital operator.  07/12/2022, 10:44 AM

## 2022-07-12 NOTE — Progress Notes (Signed)
Initial Nutrition Assessment RD working remotely.   DOCUMENTATION CODES:   Not applicable  INTERVENTION:  - ordered Ensure Plus High Protein TID, each supplement provides 350 kcal and 20 grams of protein.  - ordered 30 ml Prosource Plus BID, each supplement provides 100 kcal and 15 grams protein.   - added High Calorie, High Protein handout from the Academy of [employer] Dietetics to the AVS.  - complete NFPE when feasible.   NUTRITION DIAGNOSIS:   Increased nutrient needs related to acute illness, cancer and cancer related treatments as evidenced by estimated needs.  GOAL:   Patient will meet greater than or equal to 90% of their needs  MONITOR:   PO intake, Supplement acceptance, Labs, Weight trends  REASON FOR ASSESSMENT:   Malnutrition Screening Tool  ASSESSMENT:   61 y.o. male with medical history of type 2 DM, Barrett esophagus, chronic hepatitis C, alcohol abuse, poorly healing left TMA with osteomyelitis, recent sternal body fracture, and pancreatic cancer s/p Whipple with imaging at [facility] in September concerning for liver metastasis. He presented to the ED due to uncontrolled pain, generalized weakness, debility, and diarrhea (~4 times/day).  Patient has been eating 0-100% at meals since advanced from CLD to Regular on 11/8 evening.   He has not been assessed by a [place] RD at any time in the past.  Weight yesterday was 139 lb, weight on 11/10 was 140 lb, and weight on 11/9 was 137 lb. Weight appears to be up after being stable from 8/8-10/31. Mild pitting edema to BLE documented in the edema section of flow sheet.   Per notes: - liver lesion - small to moderate volume ascites with diffuse mesenteric and soft tissue edema  - enteritis - pancreatic cancer receiving treatment through [facility]--treatment on hold d/t foot infection - sternal fracture - L foot osteomyelitis - alcohol abuse--CIWA protocol in place   Labs reviewed; HgbA1c: 5.1%, CBGs:  110-250 mg/dl, Na: 134 mmol/l, creatinine: 0.35 mg/dl, Ca: 7.4 mg/dl.  Medications reviewed; 1 mg folvite/day, sliding scale novolog, 36000 units creon for snacks, 72000 units creon TID for meals, 30 mg remeron/day, 1 tablet multivitamin with minerals/day, 40 mg oral protonix/day, 500 mg K Phos neutral BID 11/10-11/12, 100 mg thiamine/day.    NUTRITION - FOCUSED PHYSICAL EXAM:  RD working remotely.  Diet Order:   Diet Order             Diet regular Room service appropriate? Yes; Fluid consistency: Thin  Diet effective now                   EDUCATION NEEDS:   Education needs have been addressed  Skin:  Skin Assessment: Reviewed RN Assessment  Last BM:  11/11 (type 6 x3)  Height:   Ht Readings from Last 1 Encounters:  07/07/22 '5\' 11"'$  (1.803 m)    Weight:   Wt Readings from Last 1 Encounters:  07/11/22 63 kg     BMI:  Body mass index is 19.37 kg/m.  Estimated Nutritional Needs:  Kcal:  2100-2350 kcal Protein:  105-120 grams Fluid:  >/= 2.3 L/day      Jarome Matin, MS, RD, LDN, CNSC Clinical Dietitian PRN/Relief staff On-call/weekend pager # available in [facility]

## 2022-07-12 NOTE — Discharge Instructions (Signed)
Information on my medicine - ELIQUIS (apixaban)  Why was Eliquis prescribed for you? 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  What do You need to know about Eliquis ? The starting dose is 10 mg (two 5 mg tablets) taken TWICE daily for the FIRST SEVEN (7) DAYS, then on 07/19/2022 the dose is reduced to ONE 5 mg tablet taken TWICE daily.  Eliquis may be taken with or without food.   Try to take the dose about the same time in the morning and in the evening. If you have difficulty swallowing the tablet whole please discuss with your pharmacist how to take the medication safely.  Take Eliquis exactly as prescribed and DO NOT stop taking Eliquis without talking to the doctor who prescribed the medication.  Stopping may increase your risk of developing a new blood clot.  Refill your prescription before you run out.  After discharge, you should have regular check-up appointments with your healthcare provider that is prescribing your Eliquis.    What do you do if you miss a dose? If a dose of ELIQUIS is not taken at the scheduled time, take it as soon as possible on the same day and twice-daily administration should be resumed. The dose should not be doubled to make up for a missed dose.  Important Safety Information A possible side effect of Eliquis is bleeding. You should call your healthcare provider right away if you experience any of the following: Bleeding from an injury or your nose that does not stop. Unusual colored urine (red or dark brown) or unusual colored stools (red or black). Unusual bruising for unknown reasons. A serious fall or if you hit your head (even if there is no bleeding).  Some medicines may interact with Eliquis and might increase your risk of bleeding or clotting while on Eliquis. To help avoid this, consult your healthcare provider or pharmacist prior to using any new  prescription or non-prescription medications, including herbals, vitamins, non-steroidal anti-inflammatory drugs (NSAIDs) and supplements.  This website has more information on Eliquis (apixaban): http://[email or web address].com/eliquis/homeHigh Calorie, High Protein Nutrition Therapy  A high-calorie, high-protein diet has been recommended to you. Your registered dietitian nutritionist (RDN) may have recommended this diet because you are having difficulty eating enough calories throughout the day, you have lost weight, and/or you need to add protein to your diet.  Sometimes you may not feel like eating, even if you know the importance of good nutrition. The recommendations in this handout can help you with the following:  Regaining your strength and energy Keeping your body healthy Healing and recovering from surgery or illness and fighting infection  Schedule Your Meals and Snacks  Several small meals and snacks are often better tolerated and digested than large meals.  Strategies  Plan to eat 3 meals and 3 snacks daily. Experiment with timing meals to find out when you have a larger appetite. Appetite may be greatest in the morning after not eating all night so you may prefer to eat your larger meals and snacks in the morning and at lunch. Breakfast-type foods are often better tolerated so eat foods such as eggs, pancakes, waffles and cereal for any meal or snack. Carry snacks with you so you are prepared to eat every 2 to 3 hours. Determine what works best for you if your body's cues for feeling hungry or full are not working. Eat a small meal  or snack even if you don't feel hungry. Set a timer to remind you when it is time to eat. Take a walk before you eat (with health care provider's approval). Light or moderate physical activity can help you maintain muscle and increase your appetite.  Make Eating Enjoyable  Taking steps to make the experience enjoyable may help to increase your  interest in eating and improve your appetite.  Strategies:  Eat with others whenever possible. Include your favorite foods to make meals more enjoyable. Try new foods. Save your beverage for the end of the meal so that you have more room for food before you get full.  Add Calories to Your Meals and Snacks  Try adding calorie-dense foods so that each bite provides more nutrition.  Strategies  Drink milk, chocolate milk, soy milk, or smoothies instead of low-calorie beverages such as diet drinks or water. Cook with milk or soy milk instead of water when making dishes such as hot cereal, cocoa, or pudding. Add jelly, jam, honey, butter or margarine to bread and crackers. Add jam or fruit to ice cream and as a topping over cake. Mix dried fruit, nuts, granola, honey, or dry cereal with yogurt or hot cereals. Enjoy snacks such as milkshakes, smoothies, pudding, ice cream, or custard. Blend a fruit smoothie of a banana, frozen berries, milk or soy milk, and 1 tablespoon nonfat powdered milk or protein powder.  Add Protein to Your Meals and Snacks  Choose at least one protein food at each meal and snack to increase your daily intake.  Strategies  Add  cup nonfat dry milk powder or protein powder to make a high-protein milk to drink or to use in recipes that call for milk. Vanilla or peppermint extract or unsweetened cocoa powder could help to boost the flavor. Add hard-cooked eggs, leftover meat, grated cheese, canned beans or tofu to noodles, rice, salads, sandwiches, soups, casseroles, pasta, tuna and other mixed dishes. Add powdered milk or protein powder to hot cereals, meatloaf, casseroles, scrambled eggs, sauces, cream soups, and shakes. Add beans and lentils to salads, soups, casseroles, and vegetable dishes. Eat cottage cheese or yogurt, especially Greek yogurt, with fruit as a snack or dessert. Eat peanut or other nut butters on crackers, bread, toast, waffles, apples, bananas or  celery sticks. Add it to milkshakes, smoothies, or desserts. Consider a ready-made protein shake.   Add Fats to Your Meals and Snacks  Try adding fats to your meals and snacks. Fat provides more calories in fewer bites than carbohydrate or protein and adds flavors to your foods.  Strategies  Snack on nuts and seeds or add them to foods like salads, pasta, cereals, yogurt, and ice cream.  Saut or stir-fry vegetables, meats, chicken, fish or tofu in olive or canola oil.  Add olive oil, other vegetable oils, butter or margarine to soups, vegetables, potatoes, cooked cereal, rice, pasta, bread, crackers, pancakes, or waffles. Snack on olives or add to pasta, pizza, or salad. Add avocado or guacamole to your salads, sandwiches, and other entrees. Include fatty fish such as salmon in your weekly meal plan.  Tips For Adding Protein Nutrition Therapy    Tips Add extra egg to one or more meals  Increase the portion of milk to drink and change to skim milk if able  Include [place] yogurt or cottage cheese for snack or part of a meal  Increase portion size of protein entre and decrease portion of starch/bread  Mix protein powder, nut butter, almond/nut  milk, non-fat dry milk, or Greek yogurt to shakes and smoothies  Use these ingredients also in baked goods or other recipes Use double the amount of sandwich filling  Add protein foods to all snacks including cheese, nut butters, milk and yogurt  Food Tips for Including Protein  Beans Cook and use dried peas, beans, and tofu in soups or add to casseroles, pastas, and grain dishes that also contain cheese or meat  Mash with cheese and milk  Use tofu to make smoothies  Commercial Protein Supplements Use nutritional supplements or protein powder sold at pharmacies and grocery stores  Use protein powder in milk drinks and desserts, such as pudding  Mix with ice cream, milk, and fruit or other flavorings for a high-protein milkshake  Cottage Cheese  or [employer] Mix with or use to stuff fruits and vegetables  Add to casseroles, spaghetti, noodles, or egg dishes such as omelets, scrambled eggs, and souffls  Use gelatin, pudding-type desserts, cheesecake, and pancake or waffle batter  Use to stuff crepes, pasta shells, or manicotti  Puree and use as a substitute for sour cream  Eggs, Egg whites, and Egg Yolks Add chopped, hard-cooked eggs to salads and dressings, vegetables, casseroles, and creamed meats  Beat eggs into mashed potatoes, vegetable purees, and sauces  Add extra egg whites to quiches, scrambled eggs, custards, puddings, pancake batter, or [place] toast wash/batter  Make a rich custard with egg yolks, double strength milk, and sugar  Add extra hard-cooked yolks to deviled egg filling and sandwich spreads  Hard or Semi-Soft Cheese (Cheddar, Barnabas Lister, [place]) Melt on sandwiches, bread, muffins, tortillas, hamburgers, hot dogs, other meats or fish, vegetables, eggs, or desserts such as stewed figs or pies  Grate and add to soups, sauces, casseroles, vegetable dishes, potatoes, rice noodles, or meatloaf  Serve as a snack with crackers or bagels  Ice cream, Yogurt, and Frozen Yogurt Add to milk drinks such as milkshakes  Add to cereals, fruits, gelatin desserts, and pies  Blend or whip with soft or cooked fruits  Sandwich ice cream or frozen yogurt between enriched cake slices, cookies, or graham crackers  Use seasoned yogurt as a dip for fruits, vegetables, or chips  Use yogurt in place of sour cream in casseroles  Meat and Fish Add chopped, cooked meat or fish to vegetables, salads, casseroles, soups, sauces, and biscuit dough  Use in omelets, souffls, quiches, and sandwich fillings  Add chicken and [place] to stuffing  Wrap in pie crust or biscuit dough as turnovers  Add to stuffed baked potatoes  Add pureed meat to soups  Milk Use in beverages and in cooking  Use in preparing foods, such as hot cereal, soups, cocoa, or  pudding  Add cream sauces to vegetable and other dishes  Use evaporated milk, evaporated skim milk, or sweetened condensed milk instead of milk or water in recipes.  Nonfat Dry Milk Add 1/3 cup of nonfat dry milk powdered milk to each cup of regular milk for "double strength" milk  Add to yogurt and milk drinks, such as pasteurized eggnog and milkshakes  Add to scrambled eggs and mashed potatoes  Use in casseroles, meatloaf, hot cereal, breads, muffins, sauces, cream soups, puddings and custards, and other milk-based desserts  Nuts, Seeds, and Wheat Germ Add to casseroles, breads, muffins, pancakes, cookies, and waffles  Sprinkle on fruit, cereal, ice cream, yogurt, vegetables, salads, and toast as a crunchy topping  Use in place of breadcrumbs  Blend with parsley or spinach,  herbs, and cream for a noodle, pasta, or vegetable sauce.  Roll banana in chopped nuts  Peanut Butter Spread on sandwiches, toast, muffins, crackers, waffles, pancakes, and fruit slices  Use as a dip for raw vegetables, such as carrots, cauliflower, and celery  Blend with milk drinks, smoothies, and other beverages  Swirl through soft ice cream or yogurt  Spread on a banana then roll in crushed, dry cereal or chopped nuts   Small Meal and Snack Ideas  These snacks and meals are recommended when you have to eat but aren't necessarily hungry.  They are good choices because they are high in protein and high in calories.   2 graham crackers, 2 tablespoons peanut or other nut butter, 1 cup milk  cup [place] yogurt,  cup fruit,  cup granola 2 deviled egg halves, 5 whole wheat crackers 1 cup cream of tomato soup,  grilled cheese sandwich 1 toasted waffle topped with: 2 tablespoons peanut or nut butter, 1 tablespoon jam Trail mix made with:  cup nuts,  cup dried fruit,  cup cold cereal, any variety  cup oatmeal or cream of wheat cereal, 1 tablespoon peanut or nut butter,  cup diced fruit  High-Calorie, High-Protein  Sample 1-Day Menu   Breakfast 1 egg, scrambled 1 ounce cheddar cheese 1 English muffin, whole wheat 1 tablespoon margarine 1 tablespoon jam  cup orange juice, fortified with calcium and vitamin D  Morning Snack 1 tablespoon peanut butter 1 banana 1 cup 1% milk  Lunch Tuna salad sandwich made with: 2 slices bread, whole wheat 3 ounces tuna mixed with: 1 tablespoon mayonnaise  cup pudding  Afternoon Snack  cup hummus  cup carrots 1 pita  Evening Meal Enchilada casserole made with: 2 corn tortillas 3 ounces ground beef, cooked  cup black beans, cooked  cup corn, cooked 1 ounce grated cheddar cheese  cup enchilada sauce  avocado, sliced, topping for enchilada 1 tablespoon sour cream, topping for enchilada Salad:  cup lettuce, shredded  cup tomatoes, chopped, for salad 1 tablespoon olive oil and vinegar dressing, for salad  Evening Snack  cup Greek yogurt  cup blueberries  cup granola  Copyright 2020  [employer]y [employer]. All rights reserved

## 2022-07-13 DIAGNOSIS — K769 Liver disease, unspecified: Secondary | ICD-10-CM | POA: Diagnosis not present

## 2022-07-13 DIAGNOSIS — D696 Thrombocytopenia, unspecified: Secondary | ICD-10-CM | POA: Diagnosis not present

## 2022-07-13 DIAGNOSIS — C259 Malignant neoplasm of pancreas, unspecified: Secondary | ICD-10-CM | POA: Diagnosis not present

## 2022-07-13 DIAGNOSIS — I10 Essential (primary) hypertension: Secondary | ICD-10-CM | POA: Diagnosis not present

## 2022-07-13 LAB — RENAL FUNCTION PANEL
Albumin: 2.1 g/dL — ABNORMAL LOW (ref 3.5–5.0)
Anion gap: 5 (ref 5–15)
BUN: 9 mg/dL (ref 6–20)
CO2: 25 mmol/L (ref 22–32)
Calcium: 7.5 mg/dL — ABNORMAL LOW (ref 8.9–10.3)
Chloride: 106 mmol/L (ref 98–111)
Creatinine, Ser: 0.5 mg/dL — ABNORMAL LOW (ref 0.61–1.24)
GFR, Estimated: >60 mL/min (ref >60)
Glucose, Bld: 226 mg/dL — ABNORMAL HIGH (ref 70–99)
Phosphorus: 2.7 mg/dL (ref 2.5–4.6)
Potassium: 3.7 mmol/L (ref 3.5–5.1)
Sodium: 136 mmol/L (ref 135–145)

## 2022-07-13 LAB — GLUCOSE, CAPILLARY
Glucose-Capillary: 123 mg/dL — ABNORMAL HIGH (ref 70–99)
Glucose-Capillary: 204 mg/dL — ABNORMAL HIGH (ref 70–99)
Glucose-Capillary: 146 mg/dL — ABNORMAL HIGH (ref 70–99)
Glucose-Capillary: 188 mg/dL — ABNORMAL HIGH (ref 70–99)
Glucose-Capillary: 194 mg/dL — ABNORMAL HIGH (ref 70–99)
Glucose-Capillary: 244 mg/dL — ABNORMAL HIGH (ref 70–99)

## 2022-07-13 LAB — CBC
HCT: 27.3 % — ABNORMAL LOW (ref 39.0–52.0)
Hemoglobin: 9.1 g/dL — ABNORMAL LOW (ref 13.0–17.0)
MCH: 33 pg (ref 26.0–34.0)
MCHC: 33.3 g/dL (ref 30.0–36.0)
MCV: 98.9 fL (ref 80.0–100.0)
Platelets: 130 10*3/uL — ABNORMAL LOW (ref 150–400)
RBC: 2.76 MIL/uL — ABNORMAL LOW (ref 4.22–5.81)
RDW: 17.4 % — ABNORMAL HIGH (ref 11.5–15.5)
WBC: 10 10*3/uL (ref 4.0–10.5)
nRBC: 0 % (ref 0.0–0.2)

## 2022-07-13 LAB — MAGNESIUM: Magnesium: 1.7 mg/dL (ref 1.7–2.4)

## 2022-07-13 MED ORDER — ALUM & MAG HYDROXIDE-SIMETH 200-200-20 MG/5ML PO SUSP
30.0000 mL | ORAL | Status: DC | PRN
  Administered 2022-07-13 – 2022-07-14 (×2): 30 mL via ORAL
  Filled 2022-07-13 (×2): qty 30

## 2022-07-13 NOTE — Progress Notes (Signed)
PT Cancellation Note  Patient Details Name: [name] [name] MRN: [id number] DOB: 04/04/1961   Cancelled Treatment:    Reason Eval/Treat Not Completed: Patient declined, no reason specified;Other (comment) (nauseated)   Lelon Mast 07/13/2022, 11:50 AM

## 2022-07-13 NOTE — Progress Notes (Signed)
[facility] [address] [employer] [facility]) Hospital Liaison note:  This patient is currently enrolled in [facility] outpatient-based Palliative Care.   Visited the patient today to let him know [facility] is following him while hospitalized. Patient reports that he is going to rehab because he wants to be able to get to bathroom when he goes home. Liaison told patient he may not have the same provider after his discharge. Patient voiced understanding. Informed TOC of visit. Will continue to follow for disposition.  Please call with any outpatient palliative questions or concerns.  Thank you, Lorelee Market, LPN [facility] Liaison [phone]

## 2022-07-13 NOTE — Progress Notes (Signed)
PROGRESS NOTE    [name] [name]  GMW:[id number] DOB: [birth year] DOA: 07/07/2022 PCP: [name], NP    Chief Complaint  Patient presents with   Weakness    Brief Narrative:  HPI per Dr. Clinton Quant is a 61 y.o. male with medical history significant for type 2 diabetes mellitus, Barrett esophagus, chronic hepatitis C, alcohol abuse, poorly healing left TMA with osteomyelitis, recent sternal body fracture, and pancreatic cancer status post Whipple with imaging at [facility] in September concerning for liver metastasis, now presenting to the emergency department with uncontrolled pain, generalized weakness and debility, and diarrhea.   Patient reports that he has been having approximately 4 loose stools every day and has had difficulty controlling his sternal and abdominal pain with his home medications.  He also complains of progressive general weakness, now with great difficulty transferring to and from his wheelchair.  He has not noted any subjective fever or chills.    ED Course: Upon arrival to the ED, patient is found to be afebrile and saturating well on room air with mild tachycardia and stable blood pressure.  He is in sinus or ectopic atrial tachycardia with rate 104 on EKG.  CTA chest is negative for PE but notable for small bilateral pleural effusions and unchanged sternal fracture and pulmonary nodule.  CT of the abdomen pelvis is notable for new 5.5 cm right liver lobe lesion which is likely metastatic but CBD enhancement is concerning for cholangitis with abscess.  Labs notable for glucose 289, potassium 3.0, alkaline phosphatase 234, albumin 1.9, AST 59, AST 63, total bilirubin 2.0, hemoglobin 8.8, and platelets 98,000.   Patient was treated with Dilaudid, Zofran, oral potassium, and a liter of normal saline in the ED.    Assessment & Plan:   Principal Problem:   Liver lesion, right lobe Active Problems:   Chronic hepatitis C without hepatic coma (HCC)   Type 2  diabetes mellitus with other specified complication (HCC)   ETOH abuse   Essential hypertension   Hypokalemia   Pancreatic cancer (HCC)   Wound of foot   Closed fracture of body of sternum   Enteritis   Thrombocytopenia (HCC)   Normocytic anemia   Hypomagnesemia   Portal vein thrombosis   Diabetes mellitus (HCC)   Anemia   Pancreatic cancer metastasized to liver ([place])  #1 liver lesion -New 5.5 cm right lobe liver lesion noted on CT favored by radiologist to reflect metastatic disease but CBD enhancement raises concern for possible cholangitis versus abscess. -Patient started empirically on IV antibiotics currently on IV Zosyn. -MRI abdomen done with a 6.1 x 5.1 cm irregular heterogeneous enhancing mass lesion in the dome of the posterior right liver compatible with metastatic disease.  Appears to be thrombus in the right portal vein branch inferior to the lesion.  Additionally 1.6 cm and 0.7 cm hepatic lesions restricted diffusion and also suspicious for metastatic disease.  Sequela of Whipple procedure.  Small to moderate volume ascites with diffuse mesenteric and soft tissue edema.  Small portacaval and retrocaval lymph nodes evident. -Discontinued IV antibiotics. -Patient's primary oncologist, Dr. Lorenso Courier informed of admission via epic. -Oncology has assessed the patient, due to patient's poor functional status, recent infection of the foot feel patient is a poor systemic candidate and feel patient is hospice appropriate. -Palliative care consulted and following.   2.  Thrombus within right portal vein branch inferior to the lesion -Patient hypercoagulable state secondary to metastatic cancer. -On IV heparin and  was on full dose Lovenox and has been transitioned to Eliquis.    3.  Enteritis -Patient states has been having soft mushy stools. -Patient with electrolyte abnormalities. -C. difficile PCR negative. -GI pathogen panel negative. -Continue pancreatic enzymes. -No further  work-up needed at this time.  4.  Pancreatic cancer -Diagnosed June 2022, status post FOLFIRINOX from August 2022-January 2023, status post Whipple February 2023, chemoradiation June-August 2023. -CT at [facility] in September confirms metastatic disease to the liver. -Further treatment on hold due to left foot infection. -Oncology informed of admission and following patient and feel due to poor functional status open wound on his foot patient a poor candidate for systemic therapy at this time and feels hospice may be appropriate.   -Continue pancreatic enzymes/Creon.  -Oncology following. -Palliative care following.  5.  Anemia/thrombocytopenia -Patient with no overt bleeding.  Hemoglobin currently at 9.1 from 8.4 from 8.1 from 7.5 from 7.8 from 7.9 from 8.8 on admission.  Platelet count of 130 from 109 from 109 from 114 from 113 from 99 from 79 from 98 on admission. -Patient was on IV heparin for thrombus within the right portal vein branch and was transitioned to Lovenox and has been transitioned to Eliquis.  -Hematology/oncology informed of admission via epic and has assessed the patient.    6.  Sternal fracture -Patient noted to have had a fall 2010/31 and suffered a sternal body fracture. -Sternal fracture unchanged.   -Continue current pain management.  7.  Left foot osteomyelitis -Status post TMA. -Status post 6 weeks of vancomycin and Levaquin 07/01/2022. -Continue podiatry and ID follow-up in the outpatient settings. -Sutures removed.    8.  Hypokalemia/hypomagnesemia/hypophosphatemia -Likely secondary to metastatic disease. -Status post K-Phos 30 mmol IV x1. -Phosphorus at 2.8. -Continue oral phosphorus tablets to 500 twice daily. -Magnesium at 1.7. -Potassium repleted currently at 3.8. -Repeat labs in the AM.  9.  Insulin-dependent diabetes mellitus -Hemoglobin A1c 8.06 April 2022. -Repeat hemoglobin A1c 5.1. -CBG 146 this morning. -SSI, continue home regimen  Lyrica.  10.  Alcohol abuse -Patient with no overt withdrawal symptoms. -Continue the Ativan CIWA protocol, thiamine, folic acid, multivitamin  11.  Debility -Patient with complaints of progressive generalized weakness difficulty transferring to and from wheelchair. -PT/OT has assessed patient and recommended SNF placement. -Patient in agreement with SNF placement. -TOC consulted for SNF placement.    DVT prophylaxis: Heparin>>> Lovenox>>> Eliquis Code Status: DNR Family Communication: Updated patient.  No family at bedside. Disposition: SNF  Status is: Inpatient The patient will require care spanning > 2 midnights and should be moved to inpatient because: Unsafe disposition.   Consultants:  Palliative care: Dr. Hilma Favors 07/08/2022 Oncology: Dr. Lorenso Courier 07/09/2022  Procedures:  CT abdomen and pelvis 07/07/2022 CT angiogram chest 07/07/2022 MRI abdomen 07/08/2022  Antimicrobials:  IV cefepime 07/07/2022>>> 07/08/2022 IV Flagyl 07/07/2022>>>> 07/08/2022 IV Zosyn 07/08/2022>>>>> 07/08/2022   Subjective: Laying in bed.  Patient with complaints of nausea early on today.  Denies any emesis.  No chest pain.  No shortness of breath.  No change in chronic abdominal pain.  Does not feel too well today.    Objective: Vitals:   07/12/22 1417 07/12/22 2013 07/13/22 0353 07/13/22 0500  BP: 125/74 131/82 (!) 149/78   Pulse: 100 100 (!) 102   Resp: '18 18 18   '$ Temp: 99 F (37.2 C) 98.8 F (37.1 C) 99.3 F (37.4 C)   TempSrc: Oral Oral Oral   SpO2: 90% 97% 96%   Weight:  60.9 kg  Height:        Intake/Output Summary (Last 24 hours) at 07/13/2022 1223 Last data filed at 07/13/2022 0900 Gross per 24 hour  Intake 475 ml  Output 950 ml  Net -475 ml    Filed Weights   07/10/22 0400 07/11/22 0427 07/13/22 0500  Weight: 63.6 kg 63 kg 60.9 kg    Examination:  General exam: NAD. Respiratory system: Lungs clear to auscultation bilaterally.  No wheezes, no crackles, no rhonchi.  Fair  air movement.  Speaking in full sentences. Cardiovascular system: RRR no murmurs rubs or gallops.  No JVD.  No lower extremity edema.  Gastrointestinal system: Abdomen is soft, nondistended, some tenderness to palpation left lower quadrant (chronic). Positive bowel sounds.  No rebound.  No guarding.  Central nervous system: Alert and oriented. No focal neurological deficits. Extremities: Status post left transmetatarsal amputation incision site c/d/I.  symmetric 5 x 5 power. Skin: No rashes, lesions or ulcers Psychiatry: Judgement and insight appear normal. Mood & affect appropriate.     Data Reviewed: I have personally reviewed following labs and imaging studies  CBC: Recent Labs  Lab 07/07/22 1757 07/08/22 0542 07/09/22 2359 07/10/22 0716 07/11/22 0616 07/12/22 0544 07/13/22 0824  WBC 8.3   < > 7.0 7.2 6.6 8.0 10.0  NEUTROABS 6.2  --   --  5.5  --   --   --   HGB 8.8*   < > 7.8* 7.5* 8.1* 8.4* 9.1*  HCT 26.6*   < > 23.6* 21.5* 24.2* 24.5* 27.3*  MCV 98.5   < > 98.7 95.6 96.8 96.5 98.9  PLT 98*   < > 113* 114* 109* 109* 130*   < > = values in this interval not displayed.     Basic Metabolic Panel: Recent Labs  Lab 07/08/22 0542 07/09/22 0448 07/09/22 2359 07/10/22 0716 07/11/22 0355 07/11/22 0617 07/12/22 0544 07/12/22 0545 07/13/22 0824  NA 135 132* 133* 134*  --  135  --  134*  --   K 3.4* 4.0 3.6 3.6  --  3.5  --  3.8  --   CL 106 105 105 105  --  104  --  104  --   CO2 '26 24 25 24  '$ --  25  --  23  --   GLUCOSE 220* 160* 206* 132*  --  131*  --  132*  --   BUN <5* 5* 6 <5*  --  6  --  7  --   CREATININE 0.39* 0.45* 0.42* 0.38*  --  0.36*  --  0.35*  --   CALCIUM 7.1* 7.1* 7.6* 7.6*  --  7.6*  --  7.4*  --   MG 1.5* 1.8 2.2 1.9 1.7  --  1.7  --  1.7  PHOS 1.9* 2.2*  --  2.3*  --  2.5  --  2.8  --      GFR: Estimated Creatinine Clearance: 84.6 mL/min (A) (by C-G formula based on SCr of 0.35 mg/dL (L)).  Liver Function Tests: Recent Labs  Lab  07/07/22 1757 07/08/22 0542 07/09/22 0448 07/09/22 2359 07/10/22 0716 07/11/22 0617 07/12/22 0545  AST 59* 37 35 30  --   --   --   ALT 63* 48* 37 30  --   --   --   ALKPHOS 234* 198* 193* 170*  --   --   --   BILITOT 2.0* 2.0* 1.4* 1.3*  --   --   --  PROT 5.5* 4.6* 4.8* 5.7*  --   --   --   ALBUMIN 1.9* 1.6* 1.5* 2.9* 2.7* 2.4* 2.3*     CBG: Recent Labs  Lab 07/12/22 2015 07/12/22 2314 07/13/22 0348 07/13/22 0724 07/13/22 1107  GLUCAP 160* 129* 204* 146* 188*      Recent Results (from the past 240 hour(s))  Gastrointestinal Panel by PCR , Stool     Status: None   Collection Time: 07/08/22 10:28 PM   Specimen: Stool  Result Value Ref Range Status   Campylobacter species NOT DETECTED NOT DETECTED Final   Plesimonas shigelloides NOT DETECTED NOT DETECTED Final   Salmonella species NOT DETECTED NOT DETECTED Final   Yersinia enterocolitica NOT DETECTED NOT DETECTED Final   Vibrio species NOT DETECTED NOT DETECTED Final   Vibrio cholerae NOT DETECTED NOT DETECTED Final   Enteroaggregative E coli (EAEC) NOT DETECTED NOT DETECTED Final   Enteropathogenic E coli (EPEC) NOT DETECTED NOT DETECTED Final   Enterotoxigenic E coli (ETEC) NOT DETECTED NOT DETECTED Final   Shiga like toxin producing E coli (STEC) NOT DETECTED NOT DETECTED Final   Shigella/Enteroinvasive E coli (EIEC) NOT DETECTED NOT DETECTED Final   Cryptosporidium NOT DETECTED NOT DETECTED Final   Cyclospora cayetanensis NOT DETECTED NOT DETECTED Final   Entamoeba histolytica NOT DETECTED NOT DETECTED Final   Giardia lamblia NOT DETECTED NOT DETECTED Final   Adenovirus F40/41 NOT DETECTED NOT DETECTED Final   Astrovirus NOT DETECTED NOT DETECTED Final   Norovirus GI/GII NOT DETECTED NOT DETECTED Final   Rotavirus A NOT DETECTED NOT DETECTED Final   Sapovirus (I, II, IV, and V) NOT DETECTED NOT DETECTED Final    Comment: Performed at [facility], [address] [place], [place] [address]  C  Difficile Quick Screen w PCR reflex     Status: None   Collection Time: 07/08/22 10:28 PM   Specimen: Stool  Result Value Ref Range Status   C Diff antigen NEGATIVE NEGATIVE Final   C Diff toxin NEGATIVE NEGATIVE Final   C Diff interpretation No C. difficile detected.  Final    Comment: Performed at [facility], [place] [address] [place], [place] [address]         Radiology Studies: No results found.      Scheduled Meds:  (feeding supplement) PROSource Plus  30 mL Oral BID BM   acamprosate  666 mg Oral TID WC   apixaban  10 mg Oral BID   Followed by   Derrill Memo ON 07/19/2022] apixaban  5 mg Oral BID   aspirin EC  81 mg Oral Daily   atorvastatin  20 mg Oral Daily   Chlorhexidine Gluconate Cloth  6 each Topical Daily   escitalopram  10 mg Oral Daily   feeding supplement  237 mL Oral TID BM   folic acid  1 mg Oral Daily   insulin aspart  0-6 Units Subcutaneous Q4H   lipase/protease/amylase  72,000 Units Oral TID WC   mirtazapine  30 mg Oral QHS   multivitamin with minerals  1 tablet Oral Daily   nicotine  21 mg Transdermal Daily   pantoprazole  40 mg Oral Daily   pregabalin  75 mg Oral Daily   thiamine  100 mg Oral Daily   Or   thiamine  100 mg Intravenous Daily   Continuous Infusions:     LOS: 6 days    Time spent: 35 mins    Irine Seal, MD [facility]  To contact the attending provider between 7A-7P or the covering provider during after hours 7P-7A, please log into the web site [email or web address] and access using universal Walker password for that web site. If you do not have the password, please call the hospital operator.  07/13/2022, 12:23 PM

## 2022-07-13 NOTE — Progress Notes (Addendum)
Regarding: [name] [name]. Silos Date of Birth: [birth year] Date: 07/13/2022   To Whom It May Concern:   Please be advised that the above-named patient will require a short-term Nursing home stat - anticipated 30 days or less for rehabilitation and strengthening. The plan is for return home.

## 2022-07-14 DIAGNOSIS — I10 Essential (primary) hypertension: Secondary | ICD-10-CM | POA: Diagnosis not present

## 2022-07-14 DIAGNOSIS — K769 Liver disease, unspecified: Secondary | ICD-10-CM | POA: Diagnosis not present

## 2022-07-14 DIAGNOSIS — D696 Thrombocytopenia, unspecified: Secondary | ICD-10-CM | POA: Diagnosis not present

## 2022-07-14 DIAGNOSIS — C259 Malignant neoplasm of pancreas, unspecified: Secondary | ICD-10-CM | POA: Diagnosis not present

## 2022-07-14 LAB — CBC
HCT: 24.3 % — ABNORMAL LOW (ref 39.0–52.0)
Hemoglobin: 8 g/dL — ABNORMAL LOW (ref 13.0–17.0)
MCH: 32.8 pg (ref 26.0–34.0)
MCHC: 32.9 g/dL (ref 30.0–36.0)
MCV: 99.6 fL (ref 80.0–100.0)
Platelets: 119 10*3/uL — ABNORMAL LOW (ref 150–400)
RBC: 2.44 MIL/uL — ABNORMAL LOW (ref 4.22–5.81)
RDW: 17.2 % — ABNORMAL HIGH (ref 11.5–15.5)
WBC: 6.4 10*3/uL (ref 4.0–10.5)
nRBC: 0 % (ref 0.0–0.2)

## 2022-07-14 LAB — RENAL FUNCTION PANEL
Albumin: 2.1 g/dL — ABNORMAL LOW (ref 3.5–5.0)
Anion gap: 5 (ref 5–15)
BUN: 10 mg/dL (ref 6–20)
CO2: 25 mmol/L (ref 22–32)
Calcium: 7.4 mg/dL — ABNORMAL LOW (ref 8.9–10.3)
Chloride: 104 mmol/L (ref 98–111)
Creatinine, Ser: 0.41 mg/dL — ABNORMAL LOW (ref 0.61–1.24)
GFR, Estimated: >60 mL/min (ref >60)
Glucose, Bld: 166 mg/dL — ABNORMAL HIGH (ref 70–99)
Phosphorus: 2.5 mg/dL (ref 2.5–4.6)
Potassium: 3.6 mmol/L (ref 3.5–5.1)
Sodium: 134 mmol/L — ABNORMAL LOW (ref 135–145)

## 2022-07-14 LAB — GLUCOSE, CAPILLARY
Glucose-Capillary: 122 mg/dL — ABNORMAL HIGH (ref 70–99)
Glucose-Capillary: 167 mg/dL — ABNORMAL HIGH (ref 70–99)
Glucose-Capillary: 196 mg/dL — ABNORMAL HIGH (ref 70–99)
Glucose-Capillary: 211 mg/dL — ABNORMAL HIGH (ref 70–99)
Glucose-Capillary: 229 mg/dL — ABNORMAL HIGH (ref 70–99)
Glucose-Capillary: 273 mg/dL — ABNORMAL HIGH (ref 70–99)

## 2022-07-14 LAB — MAGNESIUM: Magnesium: 1.7 mg/dL (ref 1.7–2.4)

## 2022-07-14 MED ORDER — K PHOS MONO-SOD PHOS DI & MONO 155-852-130 MG PO TABS
500.0000 mg | ORAL_TABLET | Freq: Two times a day (BID) | ORAL | Status: AC
  Administered 2022-07-14 – 2022-07-17 (×6): 500 mg via ORAL
  Filled 2022-07-14 (×6): qty 2

## 2022-07-14 NOTE — Progress Notes (Signed)
PROGRESS NOTE    [name] [name]  KZL:[id number] DOB: 30-May-1961 DOA: 07/07/2022 PCP: [name], NP    Chief Complaint  Patient presents with   Weakness    Brief Narrative:  HPI per Dr. Clinton Quant is a 61 y.o. male with medical history significant for type 2 diabetes mellitus, Barrett esophagus, chronic hepatitis C, alcohol abuse, poorly healing left TMA with osteomyelitis, recent sternal body fracture, and pancreatic cancer status post Whipple with imaging at [facility] in September concerning for liver metastasis, now presenting to the emergency department with uncontrolled pain, generalized weakness and debility, and diarrhea.   Patient reports that he has been having approximately 4 loose stools every day and has had difficulty controlling his sternal and abdominal pain with his home medications.  He also complains of progressive general weakness, now with great difficulty transferring to and from his wheelchair.  He has not noted any subjective fever or chills.    ED Course: Upon arrival to the ED, patient is found to be afebrile and saturating well on room air with mild tachycardia and stable blood pressure.  He is in sinus or ectopic atrial tachycardia with rate 104 on EKG.  CTA chest is negative for PE but notable for small bilateral pleural effusions and unchanged sternal fracture and pulmonary nodule.  CT of the abdomen pelvis is notable for new 5.5 cm right liver lobe lesion which is likely metastatic but CBD enhancement is concerning for cholangitis with abscess.  Labs notable for glucose 289, potassium 3.0, alkaline phosphatase 234, albumin 1.9, AST 59, AST 63, total bilirubin 2.0, hemoglobin 8.8, and platelets 98,000.   Patient was treated with Dilaudid, Zofran, oral potassium, and a liter of normal saline in the ED.    Assessment & Plan:   Principal Problem:   Liver lesion, right lobe Active Problems:   Chronic hepatitis C without hepatic coma (HCC)   Type 2  diabetes mellitus with other specified complication (HCC)   ETOH abuse   Essential hypertension   Hypokalemia   Pancreatic cancer (HCC)   Wound of foot   Closed fracture of body of sternum   Enteritis   Thrombocytopenia (HCC)   Normocytic anemia   Hypomagnesemia   Portal vein thrombosis   Diabetes mellitus (HCC)   Anemia   Pancreatic cancer metastasized to liver ([place])  #1 liver lesion -New 5.5 cm right lobe liver lesion noted on CT favored by radiologist to reflect metastatic disease but CBD enhancement raises concern for possible cholangitis versus abscess. -Patient started empirically on IV antibiotics currently on IV Zosyn. -MRI abdomen done with a 6.1 x 5.1 cm irregular heterogeneous enhancing mass lesion in the dome of the posterior right liver compatible with metastatic disease.  Appears to be thrombus in the right portal vein branch inferior to the lesion.  Additionally 1.6 cm and 0.7 cm hepatic lesions restricted diffusion and also suspicious for metastatic disease.  Sequela of Whipple procedure.  Small to moderate volume ascites with diffuse mesenteric and soft tissue edema.  Small portacaval and retrocaval lymph nodes evident. -Discontinued IV antibiotics. -Patient's primary oncologist, Dr. Lorenso Courier informed of admission via epic. -Oncology has assessed the patient, due to patient's poor functional status, recent infection of the foot feel patient is a poor systemic candidate and feel patient is hospice appropriate. -Palliative care consulted and following.   2.  Thrombus within right portal vein branch inferior to the lesion -Patient hypercoagulable state secondary to metastatic cancer. -On IV heparin and  was on full dose Lovenox and has been transitioned to Eliquis.    3.  Enteritis -Patient states has been having soft mushy stools. -Patient with electrolyte abnormalities. -C. difficile PCR negative. -GI pathogen panel negative. -Continue pancreatic enzymes. -No further  work-up needed at this time.  4.  Pancreatic cancer -Diagnosed June 2022, status post FOLFIRINOX from August 2022-January 2023, status post Whipple February 2023, chemoradiation June-August 2023. -CT at [facility] in September confirms metastatic disease to the liver. -Further treatment on hold due to left foot infection. -Oncology informed of admission and following patient and feel due to poor functional status open wound on his foot patient a poor candidate for systemic therapy at this time and feels hospice may be appropriate.   -Continue pancreatic enzymes/Creon.  -Oncology following. -Palliative care following. -Patient with poor prognosis, likely discharge to SNF with palliative care following and if patient declines, may need to be transition to hospice care.  5.  Anemia/thrombocytopenia -Patient with no overt bleeding.  Hemoglobin currently at 8.0 from 9.1 from 8.4 from 8.1 from 7.5 from 7.8 from 7.9 from 8.8 on admission.  Platelet count of 119 from 130 from 109 from 109 from 114 from 113 from 99 from 79 from 98 on admission. -Patient was on IV heparin for thrombus within the right portal vein branch and was transitioned to Lovenox and has been transitioned to Eliquis.  -Hematology/oncology informed of admission via epic and has assessed the patient.   -No further work-up needed.  6.  Sternal fracture -Patient noted to have had a fall 2010/31 and suffered a sternal body fracture. -Sternal fracture unchanged.   -Continue current pain management.  7.  Left foot osteomyelitis -Status post TMA. -Status post 6 weeks of vancomycin and Levaquin 07/01/2022. -Continue podiatry and ID follow-up in the outpatient settings. -Sutures removed.    8.  Hypokalemia/hypomagnesemia/hypophosphatemia -Likely secondary to metastatic disease. -Status post K-Phos 30 mmol IV x1. -Phosphorus at 2.5. -Continue oral phosphorus tablets to 500 twice daily x3 more days.. -Magnesium at 1.7. -Potassium repleted  currently at 3.6. -Repeat labs in the AM.  9.  Insulin-dependent diabetes mellitus -Hemoglobin A1c 8.06 April 2022. -Repeat hemoglobin A1c 5.1. -CBG 122 this morning. -SSI, continue home regimen Lyrica.  10.  Alcohol abuse -Patient with no overt withdrawal symptoms. -On Ativan CIWA protocol.  Continue thiamine, folic acid, multivitamin.   11.  Debility -Patient with complaints of progressive generalized weakness difficulty transferring to and from wheelchair. -PT/OT has assessed patient and recommended SNF placement. -Patient in agreement with SNF placement, with palliative care following as patient with no poor prognosis. -TOC consulted for SNF placement.    DVT prophylaxis: Heparin>>> Lovenox>>> Eliquis Code Status: DNR Family Communication: Updated patient.  No family at bedside. Disposition: Medically stable.  SNF when bed available with palliative care following.  Status is: Inpatient The patient will require care spanning > 2 midnights and should be moved to inpatient because: Unsafe disposition.   Consultants:  Palliative care: Dr. Hilma Favors 07/08/2022 Oncology: Dr. Lorenso Courier 07/09/2022  Procedures:  CT abdomen and pelvis 07/07/2022 CT angiogram chest 07/07/2022 MRI abdomen 07/08/2022  Antimicrobials:  IV cefepime 07/07/2022>>> 07/08/2022 IV Flagyl 07/07/2022>>>> 07/08/2022 IV Zosyn 07/08/2022>>>>> 07/08/2022   Subjective: Sleeping but arousable.  No nausea or emesis today.  No chest pain.  No shortness of breath.  Still with generalized weakness.  No change in chronic abdominal pain.   Objective: Vitals:   07/13/22 0500 07/13/22 1331 07/13/22 2011 07/14/22 0432  BP:  130/79 121/74  120/80  Pulse:  98 (!) 110 89  Resp:  '16 18 16  '$ Temp:  99.1 F (37.3 C) 98.6 F (37 C) 98.6 F (37 C)  TempSrc:  Oral Oral Oral  SpO2:  93% 96% 93%  Weight: 60.9 kg   64.2 kg  Height:        Intake/Output Summary (Last 24 hours) at 07/14/2022 1038 Last data filed at 07/14/2022  0935 Gross per 24 hour  Intake 472 ml  Output 825 ml  Net -353 ml    Filed Weights   07/11/22 0427 07/13/22 0500 07/14/22 0432  Weight: 63 kg 60.9 kg 64.2 kg    Examination:  General exam: NAD. Respiratory system: CTA B.  No wheezes, no crackles, no rhonchi.  Normal respiratory effort.  No use of accessory muscles of respiration.  Speaking in full sentences.  Cardiovascular system: Regular rate rhythm no murmurs rubs or gallops.  No JVD.  No lower extremity edema.  Gastrointestinal system: Abdomen is soft, nondistended, some tenderness to palpation left lower quadrant (chronic). Positive bowel sounds.  No rebound.  No guarding.  Central nervous system: Alert and oriented. No focal neurological deficits. Extremities: Status post left transmetatarsal amputation incision site c/d/I.  symmetric 5 x 5 power. Skin: No rashes, lesions or ulcers Psychiatry: Judgement and insight appear normal. Mood & affect appropriate.     Data Reviewed: I have personally reviewed following labs and imaging studies  CBC: Recent Labs  Lab 07/07/22 1757 07/08/22 0542 07/10/22 0716 07/11/22 0616 07/12/22 0544 07/13/22 0824 07/14/22 0445  WBC 8.3   < > 7.2 6.6 8.0 10.0 6.4  NEUTROABS 6.2  --  5.5  --   --   --   --   HGB 8.8*   < > 7.5* 8.1* 8.4* 9.1* 8.0*  HCT 26.6*   < > 21.5* 24.2* 24.5* 27.3* 24.3*  MCV 98.5   < > 95.6 96.8 96.5 98.9 99.6  PLT 98*   < > 114* 109* 109* 130* 119*   < > = values in this interval not displayed.     Basic Metabolic Panel: Recent Labs  Lab 07/10/22 0716 07/11/22 0616 07/11/22 0617 07/12/22 0544 07/12/22 0545 07/13/22 0824 07/13/22 1835 07/14/22 0445  NA 134*  --  135  --  134*  --  136 134*  K 3.6  --  3.5  --  3.8  --  3.7 3.6  CL 105  --  104  --  104  --  106 104  CO2 24  --  25  --  23  --  25 25  GLUCOSE 132*  --  131*  --  132*  --  226* 166*  BUN <5*  --  6  --  7  --  9 10  CREATININE 0.38*  --  0.36*  --  0.35*  --  0.50* 0.41*  CALCIUM  7.6*  --  7.6*  --  7.4*  --  7.5* 7.4*  MG 1.9 1.7  --  1.7  --  1.7  --  1.7  PHOS 2.3*  --  2.5  --  2.8  --  2.7 2.5     GFR: Estimated Creatinine Clearance: 89.2 mL/min (A) (by C-G formula based on SCr of 0.41 mg/dL (L)).  Liver Function Tests: Recent Labs  Lab 07/07/22 1757 07/08/22 0542 07/09/22 0448 07/09/22 2359 07/10/22 0716 07/11/22 0617 07/12/22 0545 07/13/22 1835 07/14/22 0445  AST 59* 37 35 30  --   --   --   --   --  ALT 63* 48* 37 30  --   --   --   --   --   ALKPHOS 234* 198* 193* 170*  --   --   --   --   --   BILITOT 2.0* 2.0* 1.4* 1.3*  --   --   --   --   --   PROT 5.5* 4.6* 4.8* 5.7*  --   --   --   --   --   ALBUMIN 1.9* 1.6* 1.5* 2.9* 2.7* 2.4* 2.3* 2.1* 2.1*     CBG: Recent Labs  Lab 07/13/22 1613 07/13/22 2014 07/14/22 0012 07/14/22 0435 07/14/22 0725  GLUCAP 194* 244* 273* 167* 122*      Recent Results (from the past 240 hour(s))  Gastrointestinal Panel by PCR , Stool     Status: None   Collection Time: 07/08/22 10:28 PM   Specimen: Stool  Result Value Ref Range Status   Campylobacter species NOT DETECTED NOT DETECTED Final   Plesimonas shigelloides NOT DETECTED NOT DETECTED Final   Salmonella species NOT DETECTED NOT DETECTED Final   Yersinia enterocolitica NOT DETECTED NOT DETECTED Final   Vibrio species NOT DETECTED NOT DETECTED Final   Vibrio cholerae NOT DETECTED NOT DETECTED Final   Enteroaggregative E coli (EAEC) NOT DETECTED NOT DETECTED Final   Enteropathogenic E coli (EPEC) NOT DETECTED NOT DETECTED Final   Enterotoxigenic E coli (ETEC) NOT DETECTED NOT DETECTED Final   Shiga like toxin producing E coli (STEC) NOT DETECTED NOT DETECTED Final   Shigella/Enteroinvasive E coli (EIEC) NOT DETECTED NOT DETECTED Final   Cryptosporidium NOT DETECTED NOT DETECTED Final   Cyclospora cayetanensis NOT DETECTED NOT DETECTED Final   Entamoeba histolytica NOT DETECTED NOT DETECTED Final   Giardia lamblia NOT DETECTED NOT DETECTED  Final   Adenovirus F40/41 NOT DETECTED NOT DETECTED Final   Astrovirus NOT DETECTED NOT DETECTED Final   Norovirus GI/GII NOT DETECTED NOT DETECTED Final   Rotavirus A NOT DETECTED NOT DETECTED Final   Sapovirus (I, II, IV, and V) NOT DETECTED NOT DETECTED Final    Comment: Performed at [facility], [address] [place], [place] [address]  C Difficile Quick Screen w PCR reflex     Status: None   Collection Time: 07/08/22 10:28 PM   Specimen: Stool  Result Value Ref Range Status   C Diff antigen NEGATIVE NEGATIVE Final   C Diff toxin NEGATIVE NEGATIVE Final   C Diff interpretation No C. difficile detected.  Final    Comment: Performed at [facility], [place] [address] [place], [place] [address]         Radiology Studies: No results found.      Scheduled Meds:  (feeding supplement) PROSource Plus  30 mL Oral BID BM   acamprosate  666 mg Oral TID WC   apixaban  10 mg Oral BID   Followed by   Derrill Memo ON 07/19/2022] apixaban  5 mg Oral BID   aspirin EC  81 mg Oral Daily   atorvastatin  20 mg Oral Daily   Chlorhexidine Gluconate Cloth  6 each Topical Daily   escitalopram  10 mg Oral Daily   feeding supplement  237 mL Oral TID BM   folic acid  1 mg Oral Daily   insulin aspart  0-6 Units Subcutaneous Q4H   lipase/protease/amylase  72,000 Units Oral TID WC   mirtazapine  30 mg Oral QHS   multivitamin with minerals  1 tablet  Oral Daily   nicotine  21 mg Transdermal Daily   pantoprazole  40 mg Oral Daily   pregabalin  75 mg Oral Daily   thiamine  100 mg Oral Daily   Or   thiamine  100 mg Intravenous Daily   Continuous Infusions:     LOS: 7 days    Time spent: 35 mins    Irine Seal, MD [facility]   To contact the attending provider between 7A-7P or the covering provider during after hours 7P-7A, please log into the web site [email or web address] and access using universal [place] password for that web site. If you do not  have the password, please call the hospital operator.  07/14/2022, 10:38 AM

## 2022-07-14 NOTE — Inpatient Diabetes Management (Signed)
Inpatient Diabetes Program Recommendations  AACE/ADA: New Consensus Statement on Inpatient Glycemic Control (2015)  Target Ranges:  Prepandial:   less than 140 mg/dL      Peak postprandial:   less than 180 mg/dL (1-2 hours)      Critically ill patients:  140 - 180 mg/dL   Lab Results  Component Value Date   GLUCAP 122 (H) 07/14/2022   HGBA1C 5.1 07/09/2022    Review of Glycemic Control  Latest Reference Range & Units 07/13/22 20:14 07/14/22 00:12 07/14/22 04:35 07/14/22 07:25  Glucose-Capillary 70 - 99 mg/dL 244 (H) 273 (H) 167 (H) 122 (H)  (H): Data is abnormally high Diabetes history: Type 2 DM Outpatient Diabetes medications: Lyumjev 3-5 units TID, Tresiba 7 units QD Current orders for Inpatient glycemic control: Novolog 0-6 units Q4H  Inpatient Diabetes Program Recommendations:    Consider changing correction to Novolog 0-6 units TID & HS and add Levemir 5 units QD.   Thanks, Bronson Curb, MSN, RNC-OB Diabetes Coordinator [phone] (8a-5p)

## 2022-07-14 NOTE — TOC Progression Note (Signed)
Transition of Care [facility]) - Progression Note    Patient Details  Name: [name] [name] MRN: [id number] Date of Birth: 08-Feb-1961  Transition of Care [facility]) CM/SW [place], RN Phone Number:[phone]  07/14/2022, 11:24 AM  Clinical Narrative:    CM at bedside to discuss disposition plan with patient. Patient states that he is still agreeable to short term placement at SNF and plans to return home to his apartment with his girlfriend. CM provided patient with bed offers. Patient has decided to accept offer from [facility]. CM spoke with Whitney to determine if facility can accept patient. Facility can still offer bed and Whitney will initiate insurance authorization.    Expected Discharge Plan: Skilled Nursing Facility Barriers to Discharge: Ship broker, SNF Pending bed offer, Awaiting State Approval Forensic scientist)  Expected Discharge Plan and Services Expected Discharge Plan: [place] In-house Referral: NA Discharge Planning Services: CM Consult Post Acute Care Choice: [place] Living arrangements for the past 2 months: Apartment                 DME Arranged: N/A DME Agency: NA       HH Arranged: NA           Social Determinants of Health (SDOH) Interventions    Readmission Risk Interventions    07/10/2022    3:12 PM 03/27/2021   10:04 AM 11/14/2020   10:11 AM  Readmission Risk Prevention Plan  Transportation Screening Complete Complete Complete  PCP or Specialist Appt within 3-5 Days  Complete   HRI or [place]  Complete Complete  Social Work Consult for [place] Planning/Counseling  Complete Complete  Palliative Care Screening  Not Applicable Not Applicable  Medication Review Press photographer) Referral to Pharmacy Complete Complete  PCP or Specialist appointment within 3-5 days of discharge Complete    HRI or [place] Complete    SW Recovery Care/Counseling Consult Complete     Palliative Care Screening Not [place] Complete

## 2022-07-14 NOTE — Plan of Care (Signed)
  Problem: Skin Integrity: Goal: Risk for impaired skin integrity will decrease Outcome: Progressing   Problem: Activity: Goal: Risk for activity intolerance will decrease Outcome: Progressing   Problem: Pain Managment: Goal: General experience of comfort will improve Outcome: Progressing

## 2022-07-15 ENCOUNTER — Inpatient Hospital Stay (HOSPITAL_COMMUNITY): Payer: Medicaid Other

## 2022-07-15 ENCOUNTER — Other Ambulatory Visit (HOSPITAL_COMMUNITY): Payer: Self-pay

## 2022-07-15 DIAGNOSIS — R109 Unspecified abdominal pain: Secondary | ICD-10-CM

## 2022-07-15 DIAGNOSIS — E1142 Type 2 diabetes mellitus with diabetic polyneuropathy: Secondary | ICD-10-CM

## 2022-07-15 DIAGNOSIS — R509 Fever, unspecified: Secondary | ICD-10-CM | POA: Diagnosis not present

## 2022-07-15 LAB — COMPREHENSIVE METABOLIC PANEL
ALT: 17 U/L (ref 0–44)
AST: 40 U/L (ref 15–41)
Albumin: 2.2 g/dL — ABNORMAL LOW (ref 3.5–5.0)
Alkaline Phosphatase: 182 U/L — ABNORMAL HIGH (ref 38–126)
Anion gap: 2 — ABNORMAL LOW (ref 5–15)
BUN: 13 mg/dL (ref 6–20)
CO2: 26 mmol/L (ref 22–32)
Calcium: 7.4 mg/dL — ABNORMAL LOW (ref 8.9–10.3)
Chloride: 105 mmol/L (ref 98–111)
Creatinine, Ser: 0.45 mg/dL — ABNORMAL LOW (ref 0.61–1.24)
GFR, Estimated: >60 mL/min (ref >60)
Glucose, Bld: 164 mg/dL — ABNORMAL HIGH (ref 70–99)
Potassium: 3.4 mmol/L — ABNORMAL LOW (ref 3.5–5.1)
Sodium: 133 mmol/L — ABNORMAL LOW (ref 135–145)
Total Bilirubin: 1.1 mg/dL (ref 0.3–1.2)
Total Protein: 5.7 g/dL — ABNORMAL LOW (ref 6.5–8.1)

## 2022-07-15 LAB — GLUCOSE, CAPILLARY
Glucose-Capillary: 147 mg/dL — ABNORMAL HIGH (ref 70–99)
Glucose-Capillary: 158 mg/dL — ABNORMAL HIGH (ref 70–99)
Glucose-Capillary: 164 mg/dL — ABNORMAL HIGH (ref 70–99)
Glucose-Capillary: 184 mg/dL — ABNORMAL HIGH (ref 70–99)
Glucose-Capillary: 192 mg/dL — ABNORMAL HIGH (ref 70–99)
Glucose-Capillary: 201 mg/dL — ABNORMAL HIGH (ref 70–99)

## 2022-07-15 LAB — RENAL FUNCTION PANEL
Albumin: 2 g/dL — ABNORMAL LOW (ref 3.5–5.0)
Anion gap: 5 (ref 5–15)
BUN: 12 mg/dL (ref 6–20)
CO2: 26 mmol/L (ref 22–32)
Calcium: 7.3 mg/dL — ABNORMAL LOW (ref 8.9–10.3)
Chloride: 105 mmol/L (ref 98–111)
Creatinine, Ser: 0.41 mg/dL — ABNORMAL LOW (ref 0.61–1.24)
GFR, Estimated: >60 mL/min (ref >60)
Glucose, Bld: 189 mg/dL — ABNORMAL HIGH (ref 70–99)
Phosphorus: 2.7 mg/dL (ref 2.5–4.6)
Potassium: 3.8 mmol/L (ref 3.5–5.1)
Sodium: 136 mmol/L (ref 135–145)

## 2022-07-15 LAB — CBC
HCT: 23.5 % — ABNORMAL LOW (ref 39.0–52.0)
Hemoglobin: 7.6 g/dL — ABNORMAL LOW (ref 13.0–17.0)
MCH: 32.8 pg (ref 26.0–34.0)
MCHC: 32.3 g/dL (ref 30.0–36.0)
MCV: 101.3 fL — ABNORMAL HIGH (ref 80.0–100.0)
Platelets: 120 10*3/uL — ABNORMAL LOW (ref 150–400)
RBC: 2.32 MIL/uL — ABNORMAL LOW (ref 4.22–5.81)
RDW: 17.3 % — ABNORMAL HIGH (ref 11.5–15.5)
WBC: 7.6 10*3/uL (ref 4.0–10.5)
nRBC: 0 % (ref 0.0–0.2)

## 2022-07-15 LAB — CBC WITH DIFFERENTIAL/PLATELET
Abs Immature Granulocytes: 0.03 10*3/uL (ref 0.00–0.07)
Basophils Absolute: 0.1 10*3/uL (ref 0.0–0.1)
Basophils Relative: 1 %
Eosinophils Absolute: 0.1 10*3/uL (ref 0.0–0.5)
Eosinophils Relative: 1 %
HCT: 24.9 % — ABNORMAL LOW (ref 39.0–52.0)
Hemoglobin: 8 g/dL — ABNORMAL LOW (ref 13.0–17.0)
Immature Granulocytes: 0 %
Lymphocytes Relative: 5 %
Lymphs Abs: 0.5 10*3/uL — ABNORMAL LOW (ref 0.7–4.0)
MCH: 32.9 pg (ref 26.0–34.0)
MCHC: 32.1 g/dL (ref 30.0–36.0)
MCV: 102.5 fL — ABNORMAL HIGH (ref 80.0–100.0)
Monocytes Absolute: 1.4 10*3/uL — ABNORMAL HIGH (ref 0.1–1.0)
Monocytes Relative: 17 %
Neutro Abs: 6.6 10*3/uL (ref 1.7–7.7)
Neutrophils Relative %: 76 %
Platelets: 153 10*3/uL (ref 150–400)
RBC: 2.43 MIL/uL — ABNORMAL LOW (ref 4.22–5.81)
RDW: 17.1 % — ABNORMAL HIGH (ref 11.5–15.5)
WBC: 8.8 10*3/uL (ref 4.0–10.5)
nRBC: 0 % (ref 0.0–0.2)

## 2022-07-15 LAB — URINALYSIS, ROUTINE W REFLEX MICROSCOPIC
Bilirubin Urine: NEGATIVE
Glucose, UA: NEGATIVE mg/dL
Hgb urine dipstick: NEGATIVE
Ketones, ur: NEGATIVE mg/dL
Leukocytes,Ua: NEGATIVE
Nitrite: NEGATIVE
Protein, ur: NEGATIVE mg/dL
Specific Gravity, Urine: 1.015 (ref 1.005–1.030)
pH: 5 (ref 5.0–8.0)

## 2022-07-15 LAB — MAGNESIUM: Magnesium: 1.8 mg/dL (ref 1.7–2.4)

## 2022-07-15 MED ORDER — DIPHENOXYLATE-ATROPINE 2.5-0.025 MG/5ML PO LIQD: 10.0000 mL | Freq: Four times a day (QID) | ORAL | Status: DC | PRN

## 2022-07-15 MED ORDER — INSULIN GLARGINE-YFGN 100 UNIT/ML ~~LOC~~ SOLN
6.0000 [IU] | Freq: Every day | SUBCUTANEOUS | Status: DC
  Administered 2022-07-15 – 2022-07-16 (×2): 6 [IU] via SUBCUTANEOUS
  Filled 2022-07-15 (×4): qty 0.06

## 2022-07-15 MED ORDER — DIPHENOXYLATE-ATROPINE 2.5-0.025 MG PO TABS
1.0000 | ORAL_TABLET | Freq: Four times a day (QID) | ORAL | Status: DC | PRN
  Administered 2022-07-15 (×2): 1 via ORAL
  Administered 2022-07-16 – 2022-07-17 (×2): 2 via ORAL
  Filled 2022-07-15: qty 1
  Filled 2022-07-15 (×2): qty 2
  Filled 2022-07-15: qty 1

## 2022-07-15 NOTE — Assessment & Plan Note (Signed)
Replaced. °

## 2022-07-15 NOTE — Assessment & Plan Note (Signed)
Sustained 10/31 Conservative management

## 2022-07-15 NOTE — Hospital Course (Addendum)
61 y.o. male with medical history significant for type 2 diabetes mellitus, Barrett esophagus, chronic hepatitis C, alcohol abuse, poorly healing left TMA with osteomyelitis, recent sternal body fracture 06/30/2022.   Patient also has a history of pancreatic cancer diagnosed June 2022 status post FOLFIRINOX from August 2022 until January 2023.  Patient is status post Whipple February 2023 and chemoradiation June through August 2023.   Patient presented to the emergency department 11/8 with abdominal pain weakness and diarrhea.    CT imaging of the abdomen performed on 11/7 followed by MRI of the abdomen performed on 11/8 revealing  right lobe liver lesion favored by radiology to reflect metastatic disease.  Antibiotics were initially given due to concerns for concurrent cholangitis but were subsequently stopped.    Hospital course was additionally complicated by the additional diagnosis of thrombus in the right portal vein seen on MRI of the abdomen on 11/8 thought to be secondary to hypercoagulability from malignancy.  Now on Eliquis 10 mg by mouth twice daily which will later be transition to 5 mg by mouth twice daily on 11/19.Marland Kitchen    Patient was additionally found to have multiple wounds during this hospitalization including an unstageable right medial heel wound as well as a additional pressure injury to the left lateral malleolus.  Patient was evaluated by wound care and wound care was initiated as well as the initiation of Prevalon boots for offloading.  Due to patient's substantial weakness physical therapy has evaluated patient and recommended skilled physical therapy services be provided in a skilled nursing facility.  Furthermore, due to patient's poor prognosis patient was evaluated by palliative care throughout the hospitalization.  Patient voices understanding as to his poor prognosis due to metastatic cancer.  Patient was agreeable to an outpatient hospice referral at time of discharge and  referral has since been placed with [facility].  Patient is to be discharged to a skilled nursing facility in stable condition on 07/17/2022 with hospice services.

## 2022-07-15 NOTE — Assessment & Plan Note (Signed)
Chronic, likely related to malignancy No evidence of infectious etiology As needed antidiarrheals

## 2022-07-15 NOTE — Assessment & Plan Note (Signed)
Accu-Cheks before every meal and nightly with/scale insulin Adding Lantus 6 units nightly Hemoglobin A1c performed 11/9 5.1%.

## 2022-07-15 NOTE — Assessment & Plan Note (Signed)
Chronic secondary to pancreatic adenocarcinoma with metastasis to the liver as well as concurrent portal vein thrombosis.   Continue pancreatic enzymes Continue as needed opiate-based analgesics for substantial pain

## 2022-07-15 NOTE — TOC Progression Note (Signed)
Transition of Care [facility]) - Progression Note    Patient Details  Name: [name] [name] MRN: [id number] Date of Birth: Jun 11, 1961  Transition of Care [facility]) CM/SW [place], RN Phone Number:[phone]  07/15/2022, 1:12 PM  Clinical Narrative:    CM followed up with Whitney at [employer] for insurance auth update. Josem Kaufmann is currently still pending.   Expected Discharge Plan: Skilled Nursing Facility Barriers to Discharge: Ship broker, SNF Pending bed offer, Awaiting State Approval Forensic scientist)  Expected Discharge Plan and Services Expected Discharge Plan: [place] In-house Referral: NA Discharge Planning Services: CM Consult Post Acute Care Choice: [place] Living arrangements for the past 2 months: Apartment                 DME Arranged: N/A DME Agency: NA       HH Arranged: NA           Social Determinants of Health (SDOH) Interventions    Readmission Risk Interventions    07/10/2022    3:12 PM 03/27/2021   10:04 AM 11/14/2020   10:11 AM  Readmission Risk Prevention Plan  Transportation Screening Complete Complete Complete  PCP or Specialist Appt within 3-5 Days  Complete   HRI or [place]  Complete Complete  Social Work Consult for [place] Planning/Counseling  Complete Complete  Palliative Care Screening  Not Applicable Not Applicable  Medication Review Press photographer) Referral to Pharmacy Complete Complete  PCP or Specialist appointment within 3-5 days of discharge Complete    HRI or [place] Complete    SW Recovery Care/Counseling Consult Complete    Palliative Care Screening Not [place] Complete

## 2022-07-15 NOTE — Assessment & Plan Note (Signed)
No clinical evidence of bleeding Iron panel suggest mild iron deficiency Will obtain folate and vitamin B12

## 2022-07-15 NOTE — Progress Notes (Signed)
Patient called to nurses station stating that he fell, upon entering room patient is found on the floor by his bed next to the bedside commode. Patient states that he was trying to use the bathroom, "I thought that I could do it on my own". Denies any pain, no injuries observed or noted at this time. Vitals signs obtained, patient assisted to bedside commode by two staff members. MD and Dimas Millin (sister) notified of fall per this nurse.

## 2022-07-15 NOTE — Assessment & Plan Note (Addendum)
Identified via MRI of the abdomen on 11/8 Thought to be secondary to hypercoagulable state secondary to metastatic malignancy Initially managed with IV heparin then Lovenox and now oral Eliquis

## 2022-07-15 NOTE — Assessment & Plan Note (Signed)
No clinical evidence of withdrawal

## 2022-07-15 NOTE — Assessment & Plan Note (Signed)
Notable temperature of 100.6 F yesterday evening Obtaining urinalysis, chest x-ray Monitoring for recurrence.

## 2022-07-15 NOTE — Progress Notes (Signed)
   07/15/22 1620  PT Visit Information  Last PT Received On 07/15/22  Reason Eval/Treat Not Completed  (RN request hold. Patient with fall shortly before PT entering room for session- found down by nursing staff. Pt reports he was attempting to get to [facility] independently.)

## 2022-07-15 NOTE — Progress Notes (Addendum)
PROGRESS NOTE   [name] [name]  QTM:[id number] DOB: 1961/02/06 DOA: 07/07/2022 PCP: [name], NP   Date of Service: the patient was seen and examined on 07/15/2022  Brief Narrative:  61 y.o. male with medical history significant for type 2 diabetes mellitus, Barrett esophagus, chronic hepatitis C, alcohol abuse, poorly healing left TMA with osteomyelitis, recent sternal body fracture 06/30/2022.   Patient also has a history of pancreatic cancer diagnosed June 2022 status post FOLFIRINOX from August 2022 until January 2023.  Patient is status post Whipple February 2023 and chemoradiation June through August 2023.   Patient presented to the emergency department 11/8 with abdominal pain weakness and diarrhea.    CT imaging of the abdomen performed on 11/7 followed by MRI of the abdomen performed on 11/8 revealing  right lobe liver lesion favored by radiology to reflect metastatic disease.  Antibiotics were initially given due to concerns for concurrent cholangitis but were subsequently stopped.    Hospital course was additionally complicated by the additional diagnosis of thrombus in the right portal vein seen on MRI of the abdomen on 11/8 thought to be secondary to hypercoagulability from malignancy.  Now on Eliquis.       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  Patient is status post Whipple February 2023 and chemoradiation June through August 2023. CT imaging of the abdomen pelvis performed 05/2022 who confirmed development of metastatic disease to the liver MRI during this realization redemonstrated substantial metastatic lesion in  the liver thought to be due to metastatic pancreatic cancer Managing associated chronic pain as noted above Managing associated chronic diarrhea with as needed antidiarrheals Overall prognosis poor  Portal vein thrombosis Identified via MRI of the abdomen on 11/8 Thought to be secondary to hypercoagulable state secondary to metastatic malignancy Initially managed with IV heparin then Lovenox and now oral Eliquis  Chronic diarrhea Chronic, likely related to malignancy No evidence of infectious etiology As needed antidiarrheals  Anemia No clinical evidence of bleeding Iron panel suggest mild iron deficiency Will obtain folate and vitamin B12  Closed fracture of body of sternum Sustained 10/31 Conservative management  Type 2 diabetes mellitus with diabetic polyneuropathy, with long-term current use of insulin ([place]) Accu-Cheks before every meal and nightly with/scale insulin Adding Lantus 6 units nightly Hemoglobin A1c performed 11/9 5.1%.  ETOH abuse No clinical evidence of withdrawal  Hypokalemia Replaced  Hypomagnesemia Replaced  Fever Notable temperature of 100.6 F yesterday evening Obtaining urinalysis, chest x-ray Monitoring for recurrence.     Subjective:  Patient complaining of abdominal pain.  Patient describes abdominal pain is located in the left abdomen, 7 out of 10 in intensity, sharp in quality, nonradiating.  Pain is worse with movement.  Patient additionally fell out of bed earlier today this was a mechanical fall.  Patient denies any loss of consciousness or head trauma.  There is not any observed injury.  Physical Exam:  Vitals:   07/14/22 2021 07/15/22 0421 07/15/22 1350 07/15/22 1519  BP: 120/77 116/71 101/64 118/67  Pulse: 100 98 87 (!) 109  Resp: '18 16 14 16  '$ Temp: (!) 100.6 F (38.1 C) 99.3 F (37.4 C) 98.4 F (36.9 C) 98.4 F (36.9  C)  TempSrc: Oral Oral Oral Oral  SpO2: 92% 90% 93% 90%  Weight:  61.1 kg    Height:         Constitutional: Awake alert and oriented x3, no associated distress.   Skin: no rashes, no lesions, good skin turgor noted. Eyes: Pupils are equally reactive to light.  No evidence of scleral icterus or conjunctival pallor.  ENMT: Moist mucous membranes noted.  Posterior pharynx clear of any exudate or lesions.   Respiratory: clear to auscultation bilaterally, no wheezing, no crackles. Normal respiratory effort. No accessory muscle use.  Cardiovascular: Regular rate and rhythm, no murmurs / rubs / gallops. No extremity edema. 2+ pedal pulses. No carotid bruits.  Chest: Anterior chest wall tenderness without crepitus. Abdomen: Notable left-sided abdominal tenderness.  Abdomen is somewhat protuberant.  Hyperactive bowel sounds noted.   Musculoskeletal: Notable left transmetatarsal amputation.  Otherwise no other deformities of the extremities.    Data Reviewed:  I have personally reviewed and interpreted labs, imaging.  Significant findings are   CBC: Recent Labs  Lab 07/10/22 0716 07/11/22 0616 07/12/22 0544 07/13/22 0824 07/14/22 0445 07/15/22 0438  WBC 7.2 6.6 8.0 10.0 6.4 7.6  NEUTROABS 5.5  --   --   --   --   --   HGB 7.5* 8.1* 8.4* 9.1* 8.0* 7.6*  HCT 21.5* 24.2* 24.5* 27.3* 24.3* 23.5*  MCV 95.6 96.8 96.5 98.9 99.6 101.3*  PLT 114* 109* 109* 130* 119* 366*   Basic Metabolic Panel: Recent Labs  Lab 07/10/22 0716 07/11/22 0616 07/11/22 0617 07/12/22 0544 07/12/22 0545 07/13/22 0824 07/13/22 1835 07/14/22 0445 07/15/22 0438  NA 134*  --  135  --  134*  --  136 134* 136  K 3.6  --  3.5  --  3.8  --  3.7 3.6 3.8  CL 105  --  104  --  104  --  106 104 105  CO2 24  --  25  --  23  --  '25 25 26  '$ GLUCOSE 132*  --  131*  --  132*  --  226* 166* 189*  BUN <5*  --  6  --  7  --  '9 10 12  '$ CREATININE 0.38*  --  0.36*  --  0.35*  --  0.50* 0.41* 0.41*  CALCIUM 7.6*  --  7.6*  --  7.4*  --  7.5* 7.4* 7.3*  MG 1.9 1.7  --  1.7  --  1.7  --  1.7  --   PHOS 2.3*  --  2.5   --  2.8  --  2.7 2.5 2.7   GFR: Estimated Creatinine Clearance: 84.9 mL/min (A) (by C-G formula based on SCr of 0.41 mg/dL (L)). Liver Function Tests: Recent Labs  Lab 07/09/22 0448 07/09/22 2359 07/10/22 0716 07/11/22 0617 07/12/22 0545 07/13/22 1835 07/14/22 0445 07/15/22 0438  AST 35 30  --   --   --   --   --   --   ALT 37 30  --   --   --   --   --   --   ALKPHOS 193* 170*  --   --   --   --   --   --   BILITOT 1.4* 1.3*  --   --   --   --   --   --   PROT 4.8* 5.7*  --   --   --   --   --   --  ALBUMIN 1.5* 2.9*   < > 2.4* 2.3* 2.1* 2.1* 2.0*   < > = values in this interval not displayed.    Coagulation Profile: No results for input(s): "INR", "PROTIME" in the last 168 hours.    Code Status:  DNR.  Code status decision has been confirmed with: patient Family Communication: deferred    Severity of Illness:  The appropriate patient status for this patient is INPATIENT. Inpatient status is judged to be reasonable and necessary in order to provide the required intensity of service to ensure the patient's safety. The patient's presenting symptoms, physical exam findings, and initial radiographic and laboratory data in the context of their chronic comorbidities is felt to place them at high risk for further clinical deterioration. Furthermore, it is not anticipated that the patient will be medically stable for discharge from the hospital within 2 midnights of admission.   * I certify that at the point of admission it is my clinical judgment that the patient will require inpatient hospital care spanning beyond 2 midnights from the point of admission due to high intensity of service, high risk for further deterioration and high frequency of surveillance required.*  Time spent:  60 minutes  Author:  Vernelle Emerald MD  07/15/2022 7:37 PM

## 2022-07-15 NOTE — Assessment & Plan Note (Signed)
Diagnosed June 2022 status post FOLFIRINOX from August 2022 until January 2023.  Patient is status post Whipple February 2023 and chemoradiation June through August 2023. CT imaging of the abdomen pelvis performed 05/2022 who confirmed development of metastatic disease to the liver MRI during this realization redemonstrated substantial metastatic lesion in the liver thought to be due to metastatic pancreatic cancer Managing associated chronic pain as noted above Managing associated chronic diarrhea with as needed antidiarrheals Overall prognosis poor

## 2022-07-16 ENCOUNTER — Other Ambulatory Visit (HOSPITAL_COMMUNITY): Payer: Self-pay

## 2022-07-16 DIAGNOSIS — L89620 Pressure ulcer of left heel, unstageable: Secondary | ICD-10-CM

## 2022-07-16 LAB — GLUCOSE, CAPILLARY
Glucose-Capillary: 124 mg/dL — ABNORMAL HIGH (ref 70–99)
Glucose-Capillary: 127 mg/dL — ABNORMAL HIGH (ref 70–99)
Glucose-Capillary: 144 mg/dL — ABNORMAL HIGH (ref 70–99)
Glucose-Capillary: 161 mg/dL — ABNORMAL HIGH (ref 70–99)
Glucose-Capillary: 168 mg/dL — ABNORMAL HIGH (ref 70–99)
Glucose-Capillary: 275 mg/dL — ABNORMAL HIGH (ref 70–99)

## 2022-07-16 LAB — VITAMIN B12: Vitamin B-12: 757 pg/mL (ref 180–914)

## 2022-07-16 LAB — FOLATE: Folate: 15 ng/mL (ref >5.9)

## 2022-07-16 MED ORDER — ROSUVASTATIN CALCIUM 10 MG PO TABS
10.0000 mg | ORAL_TABLET | Freq: Every day | ORAL | Status: DC
  Administered 2022-07-17: 10 mg via ORAL
  Filled 2022-07-16: qty 1

## 2022-07-16 MED ORDER — OXYCODONE HCL 5 MG PO TABS: 5.0000 mg | ORAL_TABLET | Freq: Four times a day (QID) | ORAL | Status: DC | PRN

## 2022-07-16 MED ORDER — INSULIN ASPART 100 UNIT/ML IJ SOLN: 0.0000 [IU] | Freq: Every day | INTRAMUSCULAR | Status: DC

## 2022-07-16 MED ORDER — HYDROMORPHONE HCL 1 MG/ML IJ SOLN
0.5000 mg | INTRAMUSCULAR | Status: DC | PRN
  Administered 2022-07-16 – 2022-07-17 (×3): 1 mg via INTRAVENOUS
  Filled 2022-07-16 (×3): qty 1

## 2022-07-16 MED ORDER — OXYCODONE HCL 5 MG PO TABS
10.0000 mg | ORAL_TABLET | Freq: Four times a day (QID) | ORAL | Status: DC | PRN
  Administered 2022-07-16 – 2022-07-17 (×3): 10 mg via ORAL
  Filled 2022-07-16 (×3): qty 2

## 2022-07-16 MED ORDER — INSULIN ASPART 100 UNIT/ML IJ SOLN
0.0000 [IU] | Freq: Three times a day (TID) | INTRAMUSCULAR | Status: DC
  Administered 2022-07-17: 3 [IU] via SUBCUTANEOUS
  Administered 2022-07-17: 2 [IU] via SUBCUTANEOUS

## 2022-07-16 NOTE — Plan of Care (Signed)
  Problem: Education: Goal: Understanding of CV disease, CV risk reduction, and recovery process will improve Outcome: Progressing

## 2022-07-16 NOTE — Consult Note (Signed)
WOC Nurse Consult Note: Reason for Consult: two pressure injuries noted on assessment, Unstageable to right medial heel and DTPI to left lateral malleolus. Irritant contact dermatitis to the apex of the gluteal cleft. Wound type:pressure, moisture Pressure Injury POA: No Measurement: Right medial heel:  2cm x 1.5cm black eschar Right lateral malleolus: 1cm round area of erythema with a 0.2cm purple discoloration in the center. Wound bed:See above Drainage (amount, consistency, odor) None Periwound: intact Dressing procedure/placement/frequency: I have provided nursing with guidance for the care of these lesions using a povidone-iodine swabstick to cleanse the right heel Unstageable pressure injury, cover with gauze and secure, then place foot into a Prevalon Boot. The left lateral malleolus will be cleansed, then covered with folded layers of xeroform gauze, topped with dry gauze and secured. This foot is also to be placed into a Prevalon boot.  Turning and repositioning is in place and will continue, but I have provided Nursing with guidance to minimize the time in the supine position. There is an area of moisture associated skin damage at the apex of the gluteal cleft. This is to be cleanse and dried daily. A sacral foam is to be placed for PI prevention.  It is noted that patient is in declining health and that referral to a SNF/Hospice is being considered.  [place] nursing team will not follow, but will remain available to this patient, the nursing and medical teams.  Please re-consult if needed.  Thank you for inviting [place] to participate in this patient's Plan of Care.  Maudie Flakes, MSN, RN, CNS, [employer], Serita Grammes, [employer], [employer] phone:  [phone]

## 2022-07-16 NOTE — TOC Progression Note (Signed)
Transition of Care [facility]) - Progression Note    Patient Details  Name: GRIFFIN GERRARD MRN: [id number] Date of Birth: 1961/04/27  [facility]) CM/SW [place], RN Phone Number:[phone]  07/16/2022, 11:58 AM  Clinical Narrative:    CM followed up with Loree Fee at [employer] for insurance auth update. Josem Kaufmann is currently still pending.    Expected Discharge Plan: Skilled Nursing Facility Barriers to Discharge: Ship broker, SNF Pending bed offer, Awaiting State Approval Forensic scientist)  Expected Discharge Plan and Services Expected Discharge Plan: [place] In-house Referral: NA Discharge Planning Services: CM Consult Post Acute Care Choice: [place] Living arrangements for the past 2 months: Apartment                 DME Arranged: N/A DME Agency: NA       HH Arranged: NA           Social Determinants of Health (SDOH) Interventions    Readmission Risk Interventions    07/10/2022    3:12 PM 03/27/2021   10:04 AM 11/14/2020   10:11 AM  Readmission Risk Prevention Plan  Transportation Screening Complete Complete Complete  PCP or Specialist Appt within 3-5 Days  Complete   HRI or [place]  Complete Complete  Social Work Consult for [place] Planning/Counseling  Complete Complete  Palliative Care Screening  Not Applicable Not Applicable  Medication Review Press photographer) Referral to Pharmacy Complete Complete  PCP or Specialist appointment within 3-5 days of discharge Complete    HRI or [place] Complete    SW Recovery Care/Counseling Consult Complete    Palliative Care Screening Not [place] Complete

## 2022-07-16 NOTE — Progress Notes (Signed)
Physical Therapy Treatment Patient Details Name: [name] [name] MRN: [id number] DOB: December 27, 1960 Today's Date: 07/16/2022   History of Present Illness 61 y.o. male with medical history significant for type 2 diabetes mellitus, Barrett esophagus, chronic hepatitis C, alcohol abuse, poorly healing left TMA with osteomyelitis, recent sternal body fracture, and pancreatic cancer status post Whipple with imaging at [facility] in September concerning for liver metastasis, now presenting to the emergency department with uncontrolled pain, generalized weakness and debility, and diarrhea.    PT Comments    Patient very motivated to get up and walk, states "I used to walk 5000 steps everyday until I got this blister." C/o of 6/10 pain in L lower ABD, no increased pain throughout session. Required min assist for bed mobility to bring trunk upright and scoot to EOB with bed pad. Min assist needed for transfer to power up from bed. Cues needed for hand placement. Required min assist to ambulate 20 ft x2 with one rest break due to "weakness" in LE's. Pt motivated to get out of bed and walk. Pt will need ST Rehab at SNF to address mobility and functional decline prior to safely returning home.   Recommendations for follow up therapy are one component of a multi-disciplinary discharge planning process, led by the attending physician.  Recommendations may be updated based on patient status, additional functional criteria and insurance authorization.  Follow Up Recommendations  Skilled nursing-short term rehab (<3 hours/day) Can patient physically be transported by private vehicle: No   Assistance Recommended at Discharge Frequent or constant Supervision/Assistance  Patient can return home with the following A lot of help with walking and/or transfers;A lot of help with bathing/dressing/bathroom;Assistance with cooking/housework;Assist for transportation;Help with stairs or ramp for entrance;Direct supervision/assist  for medications management   Equipment Recommendations  None recommended by PT    Recommendations for Other Services       Precautions / Restrictions Precautions Precautions: Fall Precaution Comments: pt reports he had a fall PTA Restrictions Weight Bearing Restrictions: No     Mobility  Bed Mobility Overal bed mobility: Needs Assistance Bed Mobility: Supine to Sit, Sit to Supine     Supine to sit: Min assist, HOB elevated Sit to supine: Min assist, HOB elevated   General bed mobility comments: Required min assist to power up from bed and scoot to EOB with bed pad. Cues needed for hand placement.    Transfers Overall transfer level: Needs assistance Equipment used: Rolling walker (2 wheels) Transfers: Sit to/from Stand Sit to Stand: Min assist, From elevated surface           General transfer comment: assist to power up from elevated bed.    Ambulation/Gait Ambulation/Gait assistance: Min assist Gait Distance (Feet): 40 Feet Assistive device: Rolling walker (2 wheels) Gait Pattern/deviations: Step-to pattern, Decreased stride length, Shuffle Gait velocity: decr     General Gait Details: Pt was able to ambulate 20 ft x2 with one rest break due to "weakness" in LE's. Pt motivated to get out of bed and walk.   Stairs             Wheelchair Mobility    Modified Rankin (Stroke Patients Only)       Balance                                            Cognition Arousal/Alertness: Awake/alert Behavior During Therapy:  WFL for tasks assessed/performed Overall Cognitive Status: No family/caregiver present to determine baseline cognitive functioning                                 General Comments: oriented to self, poor historian        Exercises      General Comments        Pertinent Vitals/Pain Pain Assessment Pain Assessment: 0-10 Pain Score: 6  Pain Location: L lower abd Pain Descriptors / Indicators:  Aching Pain Intervention(s): Monitored during session, Limited activity within patient's tolerance    Home Living                          Prior Function            PT Goals (current goals can now be found in the care plan section) Acute Rehab PT Goals Patient Stated Goal: pt agreeable to ST-SNF PT Goal Formulation: With patient Time For Goal Achievement: 07/24/22 Potential to Achieve Goals: Fair Progress towards PT goals: Progressing toward goals    Frequency    Min 2X/week      PT Plan      Co-evaluation              AM-PAC PT "6 Clicks" Mobility   Outcome Measure  Help needed turning from your back to your side while in a flat bed without using bedrails?: A Little Help needed moving from lying on your back to sitting on the side of a flat bed without using bedrails?: A Little Help needed moving to and from a bed to a chair (including a wheelchair)?: A Little Help needed standing up from a chair using your arms (e.g., wheelchair or bedside chair)?: A Little Help needed to walk in hospital room?: A Little Help needed climbing 3-5 steps with a railing? : Total 6 Click Score: 16    End of Session Equipment Utilized During Treatment: Gait belt Activity Tolerance: Patient limited by pain Patient left: in bed;with bed alarm set;with call bell/phone within reach Nurse Communication: Mobility status PT Visit Diagnosis: Difficulty in walking, not elsewhere classified (R26.2);History of falling (Z91.81)     Time: 5993-5701 PT Time Calculation (min) (ACUTE ONLY): 15 min  Charges:  $Gait Training: 8-22 mins                        [name] [name] 07/16/2022, 11:55 AM

## 2022-07-16 NOTE — Progress Notes (Signed)
PROGRESS NOTE   [name] [name]  ZDG:[id number] DOB: Jun 24, 1961 DOA: 07/07/2022 PCP: [name], NP   Date of Service: the patient was seen and examined on 07/16/2022  Brief Narrative:  61 y.o. male with medical history significant for type 2 diabetes mellitus, Barrett esophagus, chronic hepatitis C, alcohol abuse, poorly healing left TMA with osteomyelitis, recent sternal body fracture 06/30/2022.   Patient also has a history of pancreatic cancer diagnosed June 2022 status post FOLFIRINOX from August 2022 until January 2023.  Patient is status post Whipple February 2023 and chemoradiation June through August 2023.   Patient presented to the emergency department 11/8 with abdominal pain weakness and diarrhea.    CT imaging of the abdomen performed on 11/7 followed by MRI of the abdomen performed on 11/8 revealing  right lobe liver lesion favored by radiology to reflect metastatic disease.  Antibiotics were initially given due to concerns for concurrent cholangitis but were subsequently stopped.    Hospital course was additionally complicated by the additional diagnosis of thrombus in the right portal vein seen on MRI of the abdomen on 11/8 thought to be secondary to hypercoagulability from malignancy.  Now on Eliquis.    Due to patient's substantial weakness physical therapy has evaluated patient and recommended skilled physical therapy services be provided in a skilled nursing facility.  Furthermore, due to patient's poor prognosis patient was evaluated by palliative care throughout the hospitalization.  Patient voices understanding as to his poor prognosis due to metastatic cancer.  Patient was agreeable to an outpatient hospice referral at time of discharge and referral has since been placed with [facility].     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  Patient is status post Whipple February 2023 and chemoradiation June through August 2023. CT imaging of the abdomen pelvis performed 05/2022 who confirmed development of metastatic disease to the liver MRI during this realization redemonstrated substantial metastatic lesion in the liver thought to be due to metastatic pancreatic cancer Managing associated chronic pain as noted above Managing associated chronic diarrhea with as needed antidiarrheals Overall prognosis poor as patient to receive a hospice referral at time of discharge  Portal vein thrombosis Identified via MRI of the abdomen on 11/8 Thought to be secondary to hypercoagulable state secondary to metastatic malignancy Initially managed with IV heparin then Lovenox and now oral Eliquis  Chronic diarrhea Chronic, likely related to malignancy No evidence of infectious etiology As needed antidiarrheals  Anemia No clinical evidence of bleeding Iron panel suggest mild iron deficiency Folate, vitamin B12 levels normal.  Closed fracture of body of sternum Sustained 10/31 Conservative management, as needed analgesics.  Type 2 diabetes mellitus with diabetic polyneuropathy, with long-term current use of insulin ([place]) Accu-Cheks before every meal and nightly with/scale insulin Semglee 6 units nightly Hemoglobin A1c performed 11/9 5.1%. Good glycemic control  ETOH abuse No clinical evidence of withdrawal  Hypokalemia Replaced  Hypomagnesemia Replaced  Fever Notable temperature of 100.6 F evening of 11/14 No notable recurrence Urinalysis and chest x-ray obtained on 11/15 unremarkable.  Unstageable pressure injury of the left heel  Offloading of pressure wound with  Prevalon boots Frequent turns Supportive care  Subjective:  Patient complaining of abdominal pain.  Patient describes abdominal pain is located in the left abdomen, 7 out of 10 in intensity, sharp in quality, nonradiating.  Pain is worse with movement.  Pain is unchanged from yesterday.  Pain is inadequately controlled with as needed intravenous Dilaudid.   Physical Exam:  Vitals:   07/15/22 1519 07/15/22 2013 07/16/22 0404 07/16/22 1306  BP: 118/67 118/73 116/71 105/73  Pulse: (!) 109 99 96 (!) 102  Resp: '16 18 18 20  '$ Temp: 98.4 F (36.9 C) 99.3 F (37.4 C) 99.7 F (37.6 C) 98.8 F (37.1 C)  TempSrc: Oral Oral Oral Oral  SpO2: 90% 95% 91% 96%  Weight:      Height:        Constitutional: Awake alert and oriented x3, patient is in mild distress due to pain. Skin: Notable unstageable circumferential pressure wound over the left heel approximately 3 cm in diameter.  Poor skin turgor otherwise.  Dressing for additional wound of the left lateral leg clean dry and intact. Eyes: Pupils are equally reactive to light.  No evidence of scleral icterus or conjunctival pallor.  ENMT: Moist mucous membranes noted.  Posterior pharynx clear of any exudate or lesions.   Respiratory: clear to auscultation bilaterally, no wheezing, no crackles. Normal respiratory effort. No accessory muscle use.  Cardiovascular: Regular rate and rhythm, no murmurs / rubs / gallops. No extremity edema. 2+ pedal pulses. No carotid bruits.  Chest: Anterior chest wall tenderness without crepitus. Abdomen: Notable left-sided abdominal tenderness.  Abdomen is somewhat protuberant.  Hyperactive bowel sounds noted.   Musculoskeletal: Notable left transmetatarsal amputation.  Otherwise no other deformities of the extremities.    Data Reviewed:  I have personally reviewed and interpreted labs, imaging.  Significant findings are   CBC: Recent Labs  Lab 07/10/22 0716 07/11/22 0616 07/12/22 0544 07/13/22 0824  07/14/22 0445 07/15/22 0438 07/15/22 2235  WBC 7.2   < > 8.0 10.0 6.4 7.6 8.8  NEUTROABS 5.5  --   --   --   --   --  6.6  HGB 7.5*   < > 8.4* 9.1* 8.0* 7.6* 8.0*  HCT 21.5*   < > 24.5* 27.3* 24.3* 23.5* 24.9*  MCV 95.6   < > 96.5 98.9 99.6 101.3* 102.5*  PLT 114*   < > 109* 130* 119* 120* 153   < > = values in this interval not displayed.   Basic Metabolic Panel: Recent Labs  Lab 07/11/22 0616 07/11/22 0617 07/12/22 0544 07/12/22 0545 07/13/22 0824 07/13/22 1835 07/14/22 0445 07/15/22 0438 07/15/22 2235  NA  --  135  --  134*  --  136 134* 136 133*  K  --  3.5  --  3.8  --  3.7 3.6 3.8 3.4*  CL  --  104  --  104  --  106 104 105 105  CO2  --  25  --  23  --  '25 25 26 26  '$ GLUCOSE  --  131*  --  132*  --  226* 166* 189* 164*  BUN  --  6  --  7  --  '9 10 12 13  '$ CREATININE  --  0.36*  --  0.35*  --  0.50* 0.41* 0.41* 0.45*  CALCIUM  --  7.6*  --  7.4*  --  7.5* 7.4* 7.3* 7.4*  MG 1.7  --  1.7  --  1.7  --  1.7  --  1.8  PHOS  --  2.5  --  2.8  --  2.7 2.5 2.7  --    GFR: Estimated Creatinine Clearance: 84.9 mL/min (A) (by C-G formula based on SCr of 0.45 mg/dL (L)). Liver Function Tests: Recent Labs  Lab 07/09/22 2359 07/10/22 0716 07/12/22 0545 07/13/22 1835 07/14/22 0445 07/15/22 0438 07/15/22 2235  AST 30  --   --   --   --   --  40  ALT 30  --   --   --   --   --  17  ALKPHOS 170*  --   --   --   --   --  182*  BILITOT 1.3*  --   --   --   --   --  1.1  PROT 5.7*  --   --   --   --   --  5.7*  ALBUMIN 2.9*   < > 2.3* 2.1* 2.1* 2.0* 2.2*   < > = values in this interval not displayed.    Coagulation Profile: No results for input(s): "INR", "PROTIME" in the last 168 hours.    Code Status:  DNR.  Code status decision has been confirmed with: patient Family Communication: deferred    Severity of Illness:  The appropriate patient status for this patient is INPATIENT. Inpatient status is judged to be reasonable and necessary in order to provide the  required intensity of service to ensure the patient's safety. The patient's presenting symptoms, physical exam findings, and initial radiographic and laboratory data in the context of their chronic comorbidities is felt to place them at high risk for further clinical deterioration. Furthermore, it is not anticipated that the patient will be medically stable for discharge from the hospital within 2 midnights of admission.   * I certify that at the point of admission it is my clinical judgment that the patient will require inpatient hospital care spanning beyond 2 midnights from the point of admission due to high intensity of service, high risk for further deterioration and high frequency of surveillance required.*  Time spent:  60 minutes  Author:  Vernelle Emerald MD  07/16/2022 5:41 PM

## 2022-07-17 ENCOUNTER — Other Ambulatory Visit: Payer: Self-pay | Admitting: Nurse Practitioner

## 2022-07-17 LAB — URINE CULTURE: Culture: NO GROWTH

## 2022-07-17 LAB — GLUCOSE, CAPILLARY
Glucose-Capillary: 220 mg/dL — ABNORMAL HIGH (ref 70–99)
Glucose-Capillary: 291 mg/dL — ABNORMAL HIGH (ref 70–99)

## 2022-07-17 MED ORDER — ROSUVASTATIN CALCIUM 10 MG PO TABS: 10.0000 mg | ORAL_TABLET | Freq: Every day | ORAL | Status: AC

## 2022-07-17 MED ORDER — INSULIN ASPART 100 UNIT/ML IJ SOLN: 0.0000 [IU] | Freq: Three times a day (TID) | INTRAMUSCULAR | 11 refills | Status: AC

## 2022-07-17 MED ORDER — HEPARIN SOD (PORK) LOCK FLUSH 100 UNIT/ML IV SOLN
500.0000 [IU] | Freq: Once | INTRAVENOUS | Status: AC
  Administered 2022-07-17: 500 [IU] via INTRAVENOUS
  Filled 2022-07-17: qty 5

## 2022-07-17 MED ORDER — APIXABAN 5 MG PO TABS: 10.0000 mg | ORAL_TABLET | Freq: Two times a day (BID) | ORAL | 0 refills | Status: DC

## 2022-07-17 MED ORDER — BENZONATATE 100 MG PO CAPS: 100.0000 mg | ORAL_CAPSULE | Freq: Three times a day (TID) | ORAL | 0 refills | Status: AC | PRN

## 2022-07-17 MED ORDER — OXYCODONE HCL 5 MG PO TABS: 5.0000 mg | ORAL_TABLET | Freq: Four times a day (QID) | ORAL | 0 refills | Status: AC | PRN

## 2022-07-17 MED ORDER — ENSURE ENLIVE PO LIQD: 237.0000 mL | Freq: Three times a day (TID) | ORAL | 12 refills | Status: AC

## 2022-07-17 MED ORDER — ALUM & MAG HYDROXIDE-SIMETH 200-200-20 MG/5ML PO SUSP: 30.0000 mL | ORAL | 0 refills | Status: AC | PRN

## 2022-07-17 MED ORDER — POLYETHYLENE GLYCOL 3350 17 G PO PACK: 17.0000 g | PACK | Freq: Every day | ORAL | 0 refills | Status: AC | PRN

## 2022-07-17 MED ORDER — APIXABAN 5 MG PO TABS: 5.0000 mg | ORAL_TABLET | Freq: Two times a day (BID) | ORAL | Status: AC

## 2022-07-17 MED ORDER — ACAMPROSATE CALCIUM 333 MG PO TBEC: 666.0000 mg | DELAYED_RELEASE_TABLET | Freq: Three times a day (TID) | ORAL | Status: AC

## 2022-07-17 MED ORDER — OXYCODONE HCL 10 MG PO TABS: 10.0000 mg | ORAL_TABLET | Freq: Four times a day (QID) | ORAL | 0 refills | Status: DC | PRN

## 2022-07-17 MED ORDER — INSULIN ASPART 100 UNIT/ML IJ SOLN: 0.0000 [IU] | Freq: Every day | INTRAMUSCULAR | 11 refills | Status: AC

## 2022-07-17 MED ORDER — VITAMIN B-1 100 MG PO TABS: 100.0000 mg | ORAL_TABLET | Freq: Every day | ORAL | Status: AC

## 2022-07-17 MED ORDER — INSULIN GLARGINE-YFGN 100 UNIT/ML ~~LOC~~ SOLN: 10.0000 [IU] | Freq: Every day | SUBCUTANEOUS | 11 refills | Status: AC

## 2022-07-17 MED ORDER — BISACODYL 5 MG PO TBEC: 5.0000 mg | DELAYED_RELEASE_TABLET | Freq: Every day | ORAL | 0 refills | Status: AC | PRN

## 2022-07-17 MED ORDER — PROSOURCE PLUS PO LIQD: 30.0000 mL | Freq: Two times a day (BID) | ORAL | Status: AC

## 2022-07-17 NOTE — Plan of Care (Signed)
  Problem: Education: Goal: Understanding of CV disease, CV risk reduction, and recovery process will improve Outcome: Progressing   Problem: Activity: Goal: Ability to return to baseline activity level will improve Outcome: Progressing   Problem: Cardiovascular: Goal: Ability to achieve and maintain adequate cardiovascular perfusion will improve Outcome: Progressing   Problem: Health Behavior/Discharge Planning: Goal: Ability to safely manage health-related needs after discharge will improve Outcome: Progressing   

## 2022-07-17 NOTE — TOC Transition Note (Addendum)
Transition of Care [facility]) - CM/SW Discharge Note   Patient Details  Name: [name] [name] MRN: [id number] Date of Birth: Sep 07, 1960  Transition of [facility]) CM/SW Contact:  Angelita Ingles, RN Phone Number:[phone]  07/17/2022, 8:24 AM   Clinical Narrative:    CM received message that patient has received insurance authorization for SNF. MD has been updated. CM has sent message requesting room info.   1007 Patient is discharging to: [place] [phone] Discharge summary has been faxed to [facility]. Transportation has been set up via [place]. Discharge packet is at nurses station. Patient declines for CM to update family of discharge. Patient states that he will update. No other needs noted at this time. TOC will sign off.     Barriers to Discharge: Ship broker, SNF Pending bed offer, Awaiting State Approval (PASRR)   Patient Goals and CMS Choice Patient states their goals for this hospitalization and ongoing recovery are:: Wants to get better to go home CMS Medicare.gov Compare Post Acute Care list provided to:: Patient Choice offered to / list presented to : Patient  Discharge Placement                       Discharge Plan and Services In-house Referral: NA Discharge Planning Services: CM Consult Post Acute Care Choice: [place]          DME Arranged: N/A DME Agency: NA       HH Arranged: NA          Social Determinants of Health (SDOH) Interventions     Readmission Risk Interventions    07/10/2022    3:12 PM 03/27/2021   10:04 AM 11/14/2020   10:11 AM  Readmission Risk Prevention Plan  Transportation Screening Complete Complete Complete  PCP or Specialist Appt within 3-5 Days  Complete   HRI or [place]  Complete Complete  Social Work Consult for [place] Planning/Counseling  Complete Complete  Palliative Care Screening  Not Applicable Not Applicable  Medication Review Human resources officer) Referral to Pharmacy Complete Complete  PCP or Specialist appointment within 3-5 days of discharge Complete    HRI or Home Care Consult Complete    SW Recovery Care/Counseling Consult Complete    Palliative Care Screening Not [place] Complete

## 2022-07-17 NOTE — Discharge Summary (Signed)
Physician Discharge Summary   Patient: [name] [name] MRN: [id number] DOB: 12-14-1960  Admit date:     07/07/2022  Discharge date: 07/17/22  Discharge Physician: Vernelle Emerald   PCP: [name], NP   Recommendations at discharge:   Patient is to undergo Accu-Cheks before every meal and nightly with sliding scale insulin and insulin therapy as documented in the medication reconciliation Patient is to receive a low-sodium low carbohydrate diet Patient presents is a right heel and left lateral malleolus wound.  Please perform regular wound care as instructed in the discharge orders.  Change each dressing daily.  Please ensure patient's feet are always fastened in Prevalon boots. 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 *   Hospital Course: 61 y.o. male with medical history significant for type 2 diabetes mellitus, Barrett esophagus, chronic hepatitis C, alcohol abuse, poorly healing left TMA with osteomyelitis, recent sternal body fracture 06/30/2022.   Patient also has a history of pancreatic cancer diagnosed June 2022 status post FOLFIRINOX from August 2022 until January 2023.  Patient is status post Whipple February 2023 and chemoradiation June through August 2023.   Patient presented to the emergency department 11/8 with abdominal pain weakness and diarrhea.    CT imaging of the abdomen performed on 11/7 followed by MRI of the abdomen performed on 11/8 revealing  right  lobe liver lesion favored by radiology to reflect metastatic disease.  Antibiotics were initially given due to concerns for concurrent cholangitis but were subsequently stopped.    Hospital course was additionally complicated by the additional diagnosis of thrombus in the right portal vein seen on MRI of the abdomen on 11/8 thought to be secondary to hypercoagulability from malignancy.  Now on Eliquis 10 mg by mouth twice daily which will later be transition to 5 mg by mouth twice daily on 11/19.Marland Kitchen    Patient was additionally found to have multiple wounds during this hospitalization including an unstageable right medial heel wound as well as a additional pressure injury to the left lateral malleolus.  Patient was evaluated by wound care and wound care was initiated as well as the initiation of Prevalon boots for offloading.  Due to patient's substantial weakness physical therapy has evaluated patient and recommended skilled physical therapy services be provided in a skilled nursing facility.  Furthermore, due to patient's poor prognosis patient was evaluated by palliative care throughout the hospitalization.  Patient voices understanding as to his poor prognosis due to metastatic cancer.  Patient was agreeable to an outpatient hospice referral at time of discharge and referral has since been placed with [facility].  Patient is to be discharged to a skilled nursing facility in stable condition on 07/17/2022 with hospice services.      Pain control - [employer] Controlled Substance Reporting System database was reviewed. and patient was instructed, not to drive, operate heavy machinery, perform activities at heights, swimming or participation in water activities or provide baby-sitting services while on  Pain, Sleep and Anxiety Medications; until their outpatient Physician has advised to do so again. Also recommended to not to take more than prescribed Pain, Sleep and Anxiety Medications.    Consultants: Dr. Hilma Favors with Palliative care Procedures performed: None  Disposition: Skilled nursing facility Diet recommendation:  Discharge Diet Orders (From admission, onward)     Start     Ordered   07/17/22 0000  Diet Carb Modified        07/17/22 1138           Cardiac diet , carb modified  DISCHARGE MEDICATION: Allergies as of 07/17/2022       Reactions   Bee Venom Anaphylaxis   Lactose Intolerance (gi) Other (See Comments)   GI upset        Medication List     STOP taking these medications    Accu-Chek Guide test strip Generic drug: glucose blood   Accu-Chek Softclix Lancets lancets   atorvastatin 20 MG tablet Commonly known as: LIPITOR   DULoxetine 60 MG capsule Commonly known as: CYMBALTA   FreeStyle Libre 2 Sensor Misc   lidocaine-prilocaine cream Commonly known as: EMLA   Lyumjev KwikPen 100 UNIT/ML KwikPen Generic drug: Insulin Lispro-aabc   mupirocin ointment 2 % Commonly known as: BACTROBAN   nicotine 21 mg/24hr patch Commonly known as: NICODERM CQ - dosed in mg/24 hours   prochlorperazine 10 MG tablet Commonly known as: COMPAZINE   Tresiba FlexTouch 100 UNIT/ML FlexTouch Pen Generic drug: insulin degludec   TRUEplus Pen Needles 32G X 4 MM Misc Generic drug: Insulin Pen Needle   varenicline 1 MG tablet Commonly known as: APO-Varenicline   Xtampza ER 9 MG C12a Generic drug: oxyCODONE ER       TAKE these medications    acamprosate 333 MG tablet Commonly known as: CAMPRAL Take 2 tablets (666 mg total) by mouth 3 (three) times daily with meals.   Acetaminophen Extra Strength 500 MG Tabs Take 1 tablet by mouth every 6 hours as needed for moderate pain. What changed:  how much to take when to take this   alum & mag hydroxide-simeth 200-200-20 MG/5ML suspension Commonly known as: MAALOX/MYLANTA Take 30 mLs by mouth every 4 (four) hours as needed for indigestion.   apixaban 5 MG Tabs tablet Commonly known as:  ELIQUIS Take 2 tablets (10 mg total) by mouth 2 (two) times daily for 3 doses.   apixaban 5 MG Tabs tablet Commonly known as: ELIQUIS Take 1 tablet (5 mg total) by mouth 2 (two) times daily. To be taken beginning 07/19/2022 Start taking on: July 19, 2022   aspirin EC 81 MG tablet Take 1 tablet by mouth daily.   benzonatate 100 MG capsule Commonly known as: TESSALON Take 1 capsule (100 mg total) by mouth 3 (three) times daily as needed for cough.   bisacodyl 5 MG EC tablet Commonly known as: DULCOLAX Take 1 tablet (5 mg total) by mouth daily as needed for moderate constipation.   CertaVite/Antioxidants Tabs Take 1 tablet by mouth in the morning.   Creon 36000 UNITS Cpep capsule Generic drug: lipase/protease/amylase Take 1 capsule (36,000 Units total) by mouth 3 (three) times daily with meals. May take 1 additional capsule with snacks What changed:  how much to take additional instructions   diphenoxylate-atropine 2.5-0.025 MG tablet Commonly known as: Lomotil Take 1 tablet by mouth 4 times daily as needed for diarrhea or loose stools.   EpiPen 2-Pak 0.3 mg/0.3 mL Soaj injection Generic drug: EPINEPHrine Inject 0.3  mg into the muscle once as needed for anaphylaxis.   escitalopram 10 MG tablet Commonly known as: Lexapro Take 1 tablet (10 mg total) by mouth daily.   feeding supplement Liqd Take 237 mLs by mouth 3 (three) times daily between meals.   (feeding supplement) PROSource Plus liquid Take 30 mLs by mouth 2 (two) times daily between meals.   folic acid 1 MG tablet Commonly known as: FOLVITE Take 1 tablet (1 mg total) by mouth in the morning.   insulin aspart 100 UNIT/ML injection Commonly known as: novoLOG Inject 0-6 Units into the skin 3 (three) times daily with meals.   insulin aspart 100 UNIT/ML injection Commonly known as: novoLOG Inject 0-5 Units into the skin at bedtime.   insulin glargine-yfgn 100 UNIT/ML injection Commonly known as:  SEMGLEE Inject 0.1 mLs (10 Units total) into the skin at bedtime.   mirtazapine 30 MG tablet Commonly known as: REMERON Take 1 tablet (30 mg total) by mouth at bedtime.   ondansetron 8 MG tablet Commonly known as: Zofran Take 1 tablet (8 mg total) by mouth 2 (two) times daily as needed. Start on day 3 after chemotherapy.   oxyCODONE 5 MG immediate release tablet Commonly known as: Oxy IR/ROXICODONE Take 1 tablet (5 mg total) by mouth every 6 (six) hours as needed for up to 3 days for moderate pain. What changed:  how much to take reasons to take this   Oxycodone HCl 10 MG Tabs Take 1 tablet (10 mg total) by mouth every 6 (six) hours as needed for up to 12 doses for severe pain. What changed: You were already taking a medication with the same name, and this prescription was added. Make sure you understand how and when to take each.   pantoprazole 40 MG tablet Commonly known as: PROTONIX Take 1 tablet by mouth daily.   polyethylene glycol 17 g packet Commonly known as: MIRALAX / GLYCOLAX Take 17 g by mouth daily as needed for mild constipation.   pregabalin 75 MG capsule Commonly known as: LYRICA Take 75 mg by mouth daily.   rosuvastatin 10 MG tablet Commonly known as: CRESTOR Take 1 tablet (10 mg total) by mouth daily. Start taking on: July 18, 2022   thiamine 100 MG tablet Commonly known as: Vitamin B-1 Take 1 tablet (100 mg total) by mouth daily. Start taking on: July 18, 2022   Ventolin HFA 108 (90 Base) MCG/ACT inhaler Generic drug: albuterol Inhale 2 puffs into the lungs every 6 hours as needed for wheezing or shortness of breath.   zolpidem 10 MG tablet Commonly known as: AMBIEN Take 1 tablet (10 mg total) by mouth at bedtime as needed for sleep.               Discharge Care Instructions  (From admission, onward)           Start     Ordered   07/17/22 0000  Discharge wound care:       Comments: Wound care to right heel Unstageable  pressure injury: wash with soap and water, rinse and pat dry.  Paint with povidone-iodine (betadine) swabstick, allow to dry. When dry, apply silicone foam dressing and place foot into Prevalon boot.  Wound care to left lateral malleolus DTPI:  Cleanse with NS, pat dry. Apply xeroform gauze Kellie Simmering # [id number]) to lesion, cover with dry gauze, secure with Kerlix roll gauze, paper tape.  Place foot into [employer].   07/17/22 1138  Follow-up Information     Facility Provider Follow up.                  Discharge Exam: Filed Weights   07/14/22 0432 07/15/22 0421 07/17/22 0500  Weight: 64.2 kg 61.1 kg 61.6 kg   Constitutional: Awake alert and oriented x3, no associated distress.  Patient is cachectic. Respiratory: clear to auscultation bilaterally, no wheezing, no crackles. Normal respiratory effort. No accessory muscle use.  Cardiovascular: Regular rate and rhythm, no murmurs / rubs / gallops. No extremity edema. 2+ pedal pulses. No carotid bruits.  Abdomen: Left-sided abdominal tenderness.  Abdomen is soft.  Musculoskeletal: No joint deformity upper and lower extremities. Good ROM, no contractures.  Poor muscle tone    Condition at discharge: fair  The results of significant diagnostics from this hospitalization (including imaging, microbiology, ancillary and laboratory) are listed below for reference.   Imaging Studies: CT Angio Chest PE W and/or Wo Contrast  Result Date: 07/17/2022 CLINICAL DATA:  Pulmonary embolism (PE) suspected, high prob Patient with history of pancreatic cancer. EXAM: CT ANGIOGRAPHY CHEST WITH CONTRAST TECHNIQUE: Multidetector CT imaging of the chest was performed using the standard protocol during bolus administration of intravenous contrast. Multiplanar CT image reconstructions and MIPs were obtained to evaluate the vascular anatomy. RADIATION DOSE REDUCTION: This exam was performed according to the departmental dose-optimization program which  includes automated exposure control, adjustment of the mA and/or kV according to patient size and/or use of iterative reconstruction technique. CONTRAST:  174m OMNIPAQUE IOHEXOL 350 MG/ML SOLN COMPARISON:  Noncontrast chest CT 06/30/2022 FINDINGS: Cardiovascular: There are no filling defects within the pulmonary arteries to suggest pulmonary embolus. The heart is normal in size. There is atherosclerosis of the thoracic aorta without acute aortic findings. Right chest port tip is in the SVC. No pericardial effusion. Mediastinum/Nodes: Similar retrosternal stranding related to subacute sternal fracture, no enlarging mediastinal hematoma. No enlarged mediastinal or hilar lymph nodes. Small hiatal hernia. No suspicious thyroid nodule. Lungs/Pleura: Emphysema. Small bilateral pleural effusions are new from recent CT. The 5 mm left lower lobe nodule series 4, image 106, is unchanged but not well-defined on the current exam due to adjacent atelectasis. Overall increase in streaky subsegmental atelectasis in the lingula and lower lobes from prior. No pneumothorax. Minimal retained mucus in the upper trachea. Upper Abdomen: Assessed on concurrent abdominopelvic CT, reported separately. Musculoskeletal: Unchanged alignment of displaced sternal fracture. No new osseous findings. Review of the MIP images confirms the above findings. IMPRESSION: 1. No pulmonary embolus. 2. Small bilateral pleural effusions are new from recent CT. Overall increase in streaky subsegmental atelectasis in the lingula and lower lobes from prior. 3. Unchanged alignment of subacute sternal fracture. 4. Unchanged 5 mm left lower lobe pulmonary nodule. Follow-up recommended per oncologic protocol. Aortic Atherosclerosis (ICD10-I70.0) and Emphysema (ICD10-J43.9). Electronically Signed   By: MKeith RakeM.D.   On: 07/17/2022 11:35   DG Chest 1 View  Result Date: 07/15/2022 CLINICAL DATA:  Pneumonia EXAM: CHEST  1 VIEW COMPARISON:  07/10/2022  FINDINGS: Single frontal view of the chest demonstrates right chest wall port unchanged. The cardiac silhouette is unremarkable. Lung volumes are diminished, with improved aeration of the lung bases since prior study. Minimal residual basilar atelectasis. Small right pleural effusion has developed in the interim. No pneumothorax. No acute bony abnormalities. IMPRESSION: 1. Small right pleural effusion. 2. Low lung volumes, with persistent but improving bibasilar atelectasis. Electronically Signed   By: MRanda NgoM.D.   On: 07/15/2022  19:59   DG CHEST PORT 1 VIEW  Result Date: 07/10/2022 CLINICAL DATA:  Fever. EXAM: PORTABLE CHEST 1 VIEW COMPARISON:  June 30, 2022. FINDINGS: The heart size and mediastinal contours are within normal limits. Hypoinflation of the lungs is noted with mild bibasilar subsegmental atelectasis and small bilateral pleural effusions. Right internal jugular Port-A-Cath is unchanged in position. The visualized skeletal structures are unremarkable. IMPRESSION: Hypoinflation of the lungs is noted with mild bibasilar subsegmental atelectasis and small pleural effusions. Electronically Signed   By: Marijo Conception M.D.   On: 07/10/2022 11:47   CT ABDOMEN PELVIS W CONTRAST  Addendum Date: 07/09/2022   ADDENDUM REPORT: 07/09/2022 12:22 ADDENDUM: This addendum is created to clarify a technical issue that occurred during this patient's radiologic imaging. Initially, a different patient was scanned under this patient's name and medical information and was associated with this exam. The report below for this exam is for the correct patient, and this was discussed with the emergency room physician at the time of this exam. Electronically Signed   By: Keith Rake M.D.   On: 07/09/2022 12:22   Result Date: 07/09/2022 CLINICAL DATA:  Acute abdominal pain. Patient with history of pancreatic cancer. EXAM: CT ABDOMEN AND PELVIS WITH CONTRAST TECHNIQUE: Multidetector CT imaging of the  abdomen and pelvis was performed using the standard protocol following bolus administration of intravenous contrast. RADIATION DOSE REDUCTION: This exam was performed according to the departmental dose-optimization program which includes automated exposure control, adjustment of the mA and/or kV according to patient size and/or use of iterative reconstruction technique. CONTRAST:  17m OMNIPAQUE IOHEXOL 350 MG/ML SOLN COMPARISON:  Abdominal CT 01/13/2022 FINDINGS: Lower chest: Assessed on concurrent chest CT, reported separately. Hepatobiliary: The previous small subcapsular collection is not definitively seen. There is a 2.5 cm low-density lesion in the right hepatic lobe that is in the region of prior subcapsular collection. Partially distended gallbladder, question small gallstones. No pericholecystic inflammation. There is no biliary dilatation Pancreas: Pancreatico duodenectomy. No inflammation of the in situ pancreas. No ductal dilatation. Spleen: There is diffuse heterogeneous enhancement of the spleen. Spleen is normal in size. Adrenals/Urinary Tract: 18 mm right adrenal nodule, series 3, image 28. This is new from prior exam. No left adrenal gland. No hydronephrosis. No evidence of renal lesion or stone. There is absent renal excretion on delayed phase imaging. Partially distended urinary bladder. Mild bladder wall thickening at the dome. Stomach/Bowel: The stomach is partially distended. There are occasional fluid-filled loops of small bowel in the central and lower abdomen, no associated wall thickening or inflammation. No obstruction. Normal appendix. Sigmoid colonic diverticulosis without diverticulitis. Vascular/Lymphatic: Moderate aortic atherosclerosis. The infrarenal aorta measures 3 cm. Mixed calcified and noncalcified atheromatous plaque throughout. The portal vein is patent. The splenic vein is patent. No evidence of mesenteric vein thrombosis. Elongated portal caval node measures 6 mm short  axis, series 3, image 27. There is an 8 mm peripancreatic node series 3, image 26. Reproductive: Central prostatic enhancement. Prostate is normal in size. Other: No ascites. No omental thickening. No free air. Musculoskeletal: There is the new lytic lesion within the left aspect of L4 vertebral body measuring at least 2.3 cm suspicious for metastasis. Pathologic fracture through the superior and inferior endplates. Mild T12 compression deformity is new with a lucency in the superior endplate, Schmorl's node versus metastasis. IMPRESSION: 1. Mild bladder wall thickening may represent cystitis. 2. New 18 mm right adrenal nodule, suspicious for metastatic disease. 3. New lytic lesion in  the left aspect of L4 vertebral body measuring at least 2.3 cm suspicious for osseous metastasis. Pathologic fracture through the superior and inferior endplates. Mild T12 compression deformity is new from prior exam with a lucency in the superior endplate, Schmorl's node versus metastasis. 4. New 2.5 cm low-density lesion in the right hepatic lobe. This is in the region of prior subcapsular collection, and may represent a metastasis versus new intrahepatic collection. This could be further assessed with MRI as clinically indicated. 5. Heterogeneous enhancement of the spleen, nonspecific. 6. Absent renal excretion on delayed phase imaging, can be seen with renal dysfunction. 7. Colonic diverticulosis without diverticulitis. Aortic Atherosclerosis (ICD10-I70.0). Electronically Signed: By: Keith Rake M.D. On: 07/07/2022 20:48   MR Abdomen W or Wo Contrast  Result Date: 07/08/2022 CLINICAL DATA:  Liver mass on CT scan. History of Whipple procedure for pancreatic cancer. EXAM: MRI ABDOMEN WITHOUT AND WITH CONTRAST TECHNIQUE: Multiplanar multisequence MR imaging of the abdomen was performed both before and after the administration of intravenous contrast. CONTRAST:  5.51m GADAVIST GADOBUTROL 1 MMOL/ML IV SOLN COMPARISON:  CT scan  07/07/2022 FINDINGS: Lower chest: Unremarkable. Hepatobiliary: 6.1 x 5.1 cm irregular heterogeneously enhancing mass lesion is identified in the dome of the posterior right liver. There appears to be thrombus within a branch of the portal vein along the anteroinferior margin of this lesion (see delayed 90 second postcontrast image 33 of series 16 and coronal postcontrast image 33 of series 18). A second 1.6 cm lesion in the anterior right liver (axial T2 image 15 of series 5) also shows heterogeneous enhancement after IV contrast administration. Both of these lesions restrict diffusion. There is a third focus of restricted diffusion in the subcapsular aspect of the anterior left liver corresponding to a subtle T2 hyperintensity measuring 7 mm on image 16/5. Cholecystectomy. No intrahepatic or extrahepatic biliary dilation. Pancreas: Status post pancreatic head resection. Fine detail of pancreatic parenchyma obscured by motion artifact. Spleen:  No splenomegaly. No focal mass lesion. Adrenals/Urinary Tract: No adrenal nodule or mass. Kidneys unremarkable. Stomach/Bowel: Surgical changes in the stomach compatible with reported history of Whipple procedure. Mild diffuse gaseous distention of small bowel and colon. Vascular/Lymphatic: No abdominal aortic aneurysm. Main portal vein is patent. Small portal caval and retrocaval lymph nodes evident. Other: Small to moderate volume ascites with diffuse mesenteric and soft tissue edema. Musculoskeletal: No focal suspicious marrow enhancement within the visualized bony anatomy. IMPRESSION: 1. 6.1 x 5.1 cm irregular heterogeneously enhancing mass lesion in the dome of the posterior right liver compatible with metastatic disease. There appears to be thrombus within a right portal vein branch inferior to this lesion. 2. Additional 1.6 cm and 0.7 cm hepatic lesions are strict diffusion and are also suspicious for metastatic disease. 3. Sequelae of Whipple procedure. 4. Small to  moderate volume ascites with diffuse mesenteric and soft tissue edema. 5. Small portal caval and retrocaval lymph nodes evident. Electronically Signed   By: EMisty StanleyM.D.   On: 07/08/2022 07:45   CT ABDOMEN PELVIS W CONTRAST  Result Date: 07/07/2022 CLINICAL DATA:  Acute abdominal pain. History of pancreatic cancer. EXAM: CT ABDOMEN AND PELVIS WITH CONTRAST TECHNIQUE: Multidetector CT imaging of the abdomen and pelvis was performed using the standard protocol following bolus administration of intravenous contrast. RADIATION DOSE REDUCTION: This exam was performed according to the departmental dose-optimization program which includes automated exposure control, adjustment of the mA and/or kV according to patient size and/or use of iterative reconstruction technique. CONTRAST:  1078mOMNIPAQUE  IOHEXOL 350 MG/ML SOLN COMPARISON:  CT 01/13/2022 FINDINGS: Lower chest: Assessed on concurrent chest CT, reported separately. Hepatobiliary: Lobulated 5.5 cm low-density lesion in the right lobe of the liver is new from May exam and suspicious for metastasis. Tiny hypodense focus centrally series 4, image 22 is at site of prior enhancement and likely a tiny hemangioma. Overall heterogeneous enhancement of the periphery of the right hepatic lobe, additional subtle lesions are not entirely excluded. The previous subcapsular fluid collection has resolved, however there is new moderate volume of ascites. Cholecystectomy. There is enhancement of the common bile duct which is nondilated. Pancreas: Partial pancreatectomy. The in situ pancreas is atrophic. Pancreatic duct measures 5 mm, previously 4 mm. No evidence of recurrent pancreatic lesion. Spleen: Normal in size without focal abnormality. Adrenals/Urinary Tract: Normal adrenal glands. There is no hydronephrosis. No renal calculi. No evidence of focal renal abnormality. Partially distended urinary bladder, unremarkable for degree of distension. Stomach/Bowel: Small  hiatal hernia. There is mild enhancement of the gastric mucosa. Partial gastrectomy and pancreatico duodenectomy. The gastrojejunal anastomosis is patent. Small bowel is diffusely fluid-filled with mild wall thickening and enhancement. No pattern of obstruction. The appendix is not confidently visualized. Small to moderate colonic stool burden. Sigmoid diverticulosis without diverticulitis. Vascular/Lymphatic: Moderate to advanced aortic atherosclerosis. No aortic aneurysm. The portal vein is patent. Splenic vein is patent. No mesenteric vein thrombosis. Small porta hepatis nodes are all subcentimeter and not enlarged by size criteria. Reproductive: Prostate is unremarkable. Other: Moderate volume of ascites, new from May exam. There is no convincing omental thickening. No free air. Musculoskeletal: Lower lumbar facet hypertrophy. No focal bone lesion. IMPRESSION: 1. New 5.5 cm low-density lesion in the right lobe of the liver. In the setting of history prior pancreatic malignancy, metastasis is favored, however there is enhancement of the common bile duct which raises the possibility of ascending cholangitis and hepatic abscess. Additionally there is ill-defined heterogeneous enhancement in the periphery of the right hepatic lobe which is nonspecific. Recommend hepatic protocol MRI for further assessment. 2. Moderate volume of ascites, new from May exam. 3. Post partial pancreatectomy and without evidence of recurrent pancreatic lesion. 4. Fluid-filled small bowel with mild wall thickening and enhancement, suggesting diffuse enteritis. 5. Sigmoid diverticulosis without diverticulitis. Aortic Atherosclerosis (ICD10-I70.0). These results were called by telephone at the time of interpretation on 07/07/2022 at 11:10 pm to provider DAVID YAO , who verbally acknowledged these results. Electronically Signed   By: Keith Rake M.D.   On: 07/07/2022 23:10   CT Angio Chest PE W and/or Wo Contrast  Result Date:  07/07/2022 CLINICAL DATA:  Pulmonary embolism (PE) suspected, high prob Patient with history of pancreatic cancer. EXAM: CT ANGIOGRAPHY CHEST WITH CONTRAST TECHNIQUE: Multidetector CT imaging of the chest was performed using the standard protocol during bolus administration of intravenous contrast. Multiplanar CT image reconstructions and MIPs were obtained to evaluate the vascular anatomy. RADIATION DOSE REDUCTION: This exam was performed according to the departmental dose-optimization program which includes automated exposure control, adjustment of the mA and/or kV according to patient size and/or use of iterative reconstruction technique. CONTRAST:  13m OMNIPAQUE IOHEXOL 350 MG/ML SOLN COMPARISON:  Noncontrast chest CT 06/30/2022 FINDINGS: Cardiovascular: There are no filling defects within the pulmonary arteries to suggest pulmonary embolus. The heart is normal in size. There is atherosclerosis of the thoracic aorta without acute aortic findings. Right chest port tip is in the SVC. No pericardial effusion. Mediastinum/Nodes: Similar retrosternal stranding related to subacute sternal fracture, no enlarging  mediastinal hematoma. No enlarged mediastinal or hilar lymph nodes. Small hiatal hernia. No suspicious thyroid nodule. Lungs/Pleura: Emphysema. Small bilateral pleural effusions are new from recent CT. The 5 mm left lower lobe nodule series 4, image 106, is unchanged but not well-defined on the current exam due to adjacent atelectasis. Overall increase in streaky subsegmental atelectasis in the lingula and lower lobes from prior. No pneumothorax. Minimal retained mucus in the upper trachea. Upper Abdomen: Assessed on concurrent abdominopelvic CT, reported separately. Musculoskeletal: Unchanged alignment of displaced sternal fracture. No new osseous findings. Review of the MIP images confirms the above findings. IMPRESSION: 1. No pulmonary embolus. 2. Small bilateral pleural effusions are new from recent CT.  Overall increase in streaky subsegmental atelectasis in the lingula and lower lobes from prior. 3. Unchanged alignment of subacute sternal fracture. 4. Unchanged 5 mm left lower lobe pulmonary nodule. Follow-up recommended per oncologic protocol. Aortic Atherosclerosis (ICD10-I70.0) and Emphysema (ICD10-J43.9). Electronically Signed   By: Keith Rake M.D.   On: 07/07/2022 23:00   CT Chest Wo Contrast  Result Date: 06/30/2022 CLINICAL DATA:  Blunt chest trauma. Fall yesterday. Anterior chest pain. Sternal fracture on radiograph. 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 EXAM: CT CHEST WITHOUT CONTRAST TECHNIQUE: Multidetector CT imaging of the chest was performed following the standard protocol without IV contrast. RADIATION DOSE REDUCTION: This exam was performed according to the departmental dose-optimization program which includes automated exposure control, adjustment of the mA and/or kV according to patient size and/or use of iterative reconstruction technique. COMPARISON:  Radiograph earlier today.  Chest CT 01/05/2022 FINDINGS: Cardiovascular: Right chest port tip in the lower SVC. Moderate atherosclerosis of the thoracic aorta. No periaortic stranding. Aortic tortuosity. The heart is normal in size. There are coronary artery calcifications. Trace pericardial effusion, improved from prior CT. Mediastinum/Nodes: Trace mediastinal stranding related to sternal fracture but no mediastinal hematoma. No pneumomediastinum. Stable small mediastinal lymph nodes from prior exam. No suspicious adenopathy. Small hiatal hernia, otherwise unremarkable esophagus. Lungs/Pleura: No pneumothorax. No pulmonary contusion. Mild emphysema with biapical pleuroparenchymal scarring. Linear bandlike opacities in the lingula, left lower and left upper lobes. Adjacent to left lower lobe atelectasis is a 5 mm left lower lobe nodule series 5, image 135. No pleural fluid. Upper Abdomen: No acute upper  abdominal findings on this unenhanced exam. Musculoskeletal: Displaced sternal body fracture with 1/2 shaft width posterior displacement of upper sternal fracture fragment. No evidence of underlying sternal lesion. No acute rib fractures. Thoracic spondylosis. 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 IMPRESSION: 1. Displaced sternal body fracture. Minimal retrosternal stranding but no confluent hematoma. 2. No additional acute traumatic injury to the chest. 3. Linear bandlike opacities in the lingula, left lower and left upper lobes, likely atelectasis. Adjacent to left lower lobe atelectasis is a 5 mm left lower lobe nodule. Given malignancy history, follow-up per oncologic protocol. 4. Aortic atherosclerosis.  Coronary artery calcifications. 5. Emphysema. Emphysema (ICD10-J43.9).  Aortic Atherosclerosis (ICD10-I70.0). Electronically Signed   By: Keith Rake M.D.   On: 06/30/2022 16:27   DG Chest 2 View  Result Date: 06/30/2022 CLINICAL DATA:  Trauma, fall, chest pain EXAM: CHEST - 2 VIEW COMPARISON:  01/05/2022 FINDINGS: Cardiac size is within normal limits. There are no signs of pulmonary edema or focal pulmonary consolidation. There is no pleural effusion or pneumothorax. Tip of right IJ chest port is seen in superior vena cava. Deformities are seen in the lateral aspect of left eighth and ninth ribs. There is fracture in  the body of sternum with 10 mm offset in alignment. Deformity in the proximal right humerus may be residual from previous injury. IMPRESSION: There is displaced fracture in the body of sternum. Deformities are seen in the lateral aspect of left eighth and ninth ribs suggesting recent or old fractures. There is no focal pulmonary consolidation. There is no pleural effusion or pneumothorax. These results will be called to the ordering clinician or representative by the Radiologist Assistant, and communication documented in the PACS or [employer].  Electronically Signed   By: Elmer Picker M.D.   On: 06/30/2022 12:24   DG Foot Complete Left  Result Date: 06/30/2022 Please see detailed radiograph report in office note.  PERIPHERAL VASCULAR CATHETERIZATION  Result Date: 06/24/2022 Images from the original result were not included. Patient name: [name] [name] MRN: [id number] DOB: Jan 02, 1961 Sex: male 06/24/2022 Pre-operative Diagnosis: Left lower extremity nonhealing wound Post-operative diagnosis:  Same Surgeon:  Broadus John, MD Procedure Performed: 1.  Ultrasound-guided micropuncture access of the right common femoral artery 2.  Aortogram 3.  Second-order cannulation, left lower extremity angiogram 4.  Moderate sedation time 23 minutes 5.  Contrast volume 55 mL 6.  Device assisted closure-Mynx Indications:  [name] [name] is a 61 y.o. male presenting with nonhealing left TMA.  TMA was performed roughly 1 year ago, with recent debridement at [facility] for osteomyelitis.  He is currently being treated with IV antibiotics.  I had a long conversation with [name] regarding his left TMA wound, as well as his life expectancy associated with pancreatic cancer.  [name] he stated he would like to do everything in an effort to continue ambulating until he passes away.  I discussed that with a palpable pulse at the level of the ankle, it is likely small vessel disease from diabetes.  His ambulation without proper support, continued smoking, malnutrition from pancreatic cancer, and compression dressing are all hindering wound healing.  We discussed left lower extremity angiography in an effort to define and improve perfusion for left leg wound healing.  I will attempt endovascular invention if he has lesions amenable.  Should he have lesions needing operative repair, [name] understands that surgery will not be offered due to his <12 month life expectancy from stage IV pancreatic cancer Findings: Aortogram: No flow-limiting stenosis in the aortoiliac segment  bilaterally On the left: Widely patent common femoral artery, profunda, superficial femoral artery, popliteal artery.  Two-vessel runoff to the foot-anterior tibial artery, posterior tibial artery with intact pedal arch.  Atretic peroneal artery which occludes in the mid tibia.  Procedure:  The patient was identified in the holding area and taken to room 8.  The patient was then placed supine on the table and prepped and draped in the usual sterile fashion.  A time out was called.  Ultrasound was used to evaluate the right common femoral artery.  It was patent .  A digital ultrasound image was acquired.  A micropuncture needle was used to access the right common femoral artery under ultrasound guidance.  An 018 wire was advanced without resistance and a micropuncture sheath was placed.  The 018 wire was removed and a benson wire was placed.  The micropuncture sheath was exchanged for a 5 french sheath.  An omniflush catheter was advanced over the wire to the level of L-1.  An abdominal angiogram was obtained.  Next, using the omniflush catheter and a benson wire, the aortic bifurcation was crossed and the catheter was placed into theleft external  iliac artery and left runoff was obtained. See results above.  No intervention performed. Impression: Inflow widely patent with two-vessel runoff to the foot.  In the foot there is a filling of plantar arteries with an intact pedal arch. Patient did not require intervention. Cassandria Santee, MD [facility] Office: [phone]   VAS [place] ABI WITH/WO TBI  Result Date: 06/19/2022  LOWER EXTREMITY DOPPLER STUDY Patient Name:  [name] [name]  Date of Exam:   06/19/2022 Medical Rec #: [id number]        Accession #:    [id number] Date of Birth: 07/28/1961        Patient Gender: M Patient Age:   78 years Exam Location:  [facility] Procedure:      VAS [place] ABI WITH/WO TBI Referring Phys: JOSHUA ROBINS  --------------------------------------------------------------------------------  Indications: Peripheral artery disease. Discoloration of the right end and 5th              toe. High Risk Factors: Hypertension, hyperlipidemia, Diabetes, current smoker.  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 Performing Technologist: Alvia Grove RVT  Examination Guidelines: A complete evaluation includes at minimum, Doppler waveform signals and systolic blood pressure reading at the level of bilateral brachial, anterior tibial, and posterior tibial arteries, when vessel segments are accessible. Bilateral testing is considered an integral part of a complete examination. Photoelectric Plethysmograph (PPG) waveforms and toe systolic pressure readings are included as required and additional duplex testing as needed. Limited examinations for reoccurring indications may be performed as noted.  ABI Findings: +---------+------------------+-----+---------+--------+ Right    Rt Pressure (mmHg)IndexWaveform Comment  +---------+------------------+-----+---------+--------+ Brachial 172                                      +---------+------------------+-----+---------+--------+ PTA      207               1.20 triphasic         +---------+------------------+-----+---------+--------+ DP       190               1.10 triphasic         +---------+------------------+-----+---------+--------+ Great Toe153               0.89 Normal            +---------+------------------+-----+---------+--------+ +---------+------------------+-----+--------+---------+ Left     Lt Pressure (mmHg)IndexWaveformComment   +---------+------------------+-----+--------+---------+ Brachial 164                                      +---------+------------------+-----+--------+---------+ PTA      197               1.15 biphasic          +---------+------------------+-----+--------+---------+ DP       208                1.21 biphasic          +---------+------------------+-----+--------+---------+ Great Toe                               amputated +---------+------------------+-----+--------+---------+ +-------+-----------+-----------+------------+------------+ ABI/TBIToday's ABIToday's TBIPrevious ABIPrevious TBI +-------+-----------+-----------+------------+------------+ Right  1.20       0.89                                +-------+-----------+-----------+------------+------------+  Left   1.21       amputation                          +-------+-----------+-----------+------------+------------+   Summary: Right: Resting right ankle-brachial index is within normal range. The right toe-brachial index is normal. Left: Resting left ankle-brachial index is within normal range. *See table(s) above for measurements and observations.  Electronically signed by Orlie Pollen on 06/19/2022 at 7:56:54 AM.    Final     Microbiology: Results for orders placed or performed during the hospital encounter of 07/07/22  Gastrointestinal Panel by PCR , Stool     Status: None   Collection Time: 07/08/22 10:28 PM   Specimen: Stool  Result Value Ref Range Status   Campylobacter species NOT DETECTED NOT DETECTED Final   Plesimonas shigelloides NOT DETECTED NOT DETECTED Final   Salmonella species NOT DETECTED NOT DETECTED Final   Yersinia enterocolitica NOT DETECTED NOT DETECTED Final   Vibrio species NOT DETECTED NOT DETECTED Final   Vibrio cholerae NOT DETECTED NOT DETECTED Final   Enteroaggregative E coli (EAEC) NOT DETECTED NOT DETECTED Final   Enteropathogenic E coli (EPEC) NOT DETECTED NOT DETECTED Final   Enterotoxigenic E coli (ETEC) NOT DETECTED NOT DETECTED Final   Shiga like toxin producing E coli (STEC) NOT DETECTED NOT DETECTED Final   Shigella/Enteroinvasive E coli (EIEC) NOT DETECTED NOT DETECTED Final   Cryptosporidium NOT DETECTED NOT DETECTED Final   Cyclospora cayetanensis NOT  DETECTED NOT DETECTED Final   Entamoeba histolytica NOT DETECTED NOT DETECTED Final   Giardia lamblia NOT DETECTED NOT DETECTED Final   Adenovirus F40/41 NOT DETECTED NOT DETECTED Final   Astrovirus NOT DETECTED NOT DETECTED Final   Norovirus GI/GII NOT DETECTED NOT DETECTED Final   Rotavirus A NOT DETECTED NOT DETECTED Final   Sapovirus (I, II, IV, and V) NOT DETECTED NOT DETECTED Final    Comment: Performed at [facility], [address] [place], [place] [address]  C Difficile Quick Screen w PCR reflex     Status: None   Collection Time: 07/08/22 10:28 PM   Specimen: Stool  Result Value Ref Range Status   C Diff antigen NEGATIVE NEGATIVE Final   C Diff toxin NEGATIVE NEGATIVE Final   C Diff interpretation No C. difficile detected.  Final    Comment: Performed at [facility], [place] [address] [place], [place] [address]  Urine Culture     Status: None   Collection Time: 07/15/22 10:35 PM   Specimen: Urine, Clean Catch  Result Value Ref Range Status   Specimen Description   Final    URINE, CLEAN CATCH Performed at [facility], [place] [address] [place], [place] [address]    Special Requests   Final    NONE Performed at [facility], [place] [address] [place], [place] [address]    Culture   Final    NO GROWTH Performed at [facility], [place] [address] [place], [place] [address]    Report Status 07/17/2022 FINAL  Final   *Note: Due to a large number of results and/or encounters for the requested time period, some results have not been displayed. A complete set of results can be found in Results Review.    Labs: CBC: Recent Labs  Lab 07/12/22 0544 07/13/22 0824 07/14/22 0445 07/15/22 0438 07/15/22 2235  WBC 8.0 10.0 6.4 7.6 8.8  NEUTROABS  --   --   --   --  6.6  HGB 8.4* 9.1* 8.0* 7.6* 8.0*  HCT 24.5* 27.3* 24.3* 23.5* 24.9*  MCV 96.5 98.9 99.6 101.3* 102.5*  PLT 109* 130* 119* 120* 790   Basic  Metabolic Panel: Recent Labs  Lab 07/11/22 0616 07/11/22 0617 07/11/22 0617 07/12/22 0544 07/12/22 0545 07/13/22 0824 07/13/22 1835 07/14/22 0445 07/15/22 0438 07/15/22 2235  NA  --  135   < >  --  134*  --  136 134* 136 133*  K  --  3.5   < >  --  3.8  --  3.7 3.6 3.8 3.4*  CL  --  104   < >  --  104  --  106 104 105 105  CO2  --  25   < >  --  23  --  '25 25 26 26  '$ GLUCOSE  --  131*   < >  --  132*  --  226* 166* 189* 164*  BUN  --  6   < >  --  7  --  '9 10 12 13  '$ CREATININE  --  0.36*   < >  --  0.35*  --  0.50* 0.41* 0.41* 0.45*  CALCIUM  --  7.6*   < >  --  7.4*  --  7.5* 7.4* 7.3* 7.4*  MG 1.7  --   --  1.7  --  1.7  --  1.7  --  1.8  PHOS  --  2.5  --   --  2.8  --  2.7 2.5 2.7  --    < > = values in this interval not displayed.   Liver Function Tests: Recent Labs  Lab 07/12/22 0545 07/13/22 1835 07/14/22 0445 07/15/22 0438 07/15/22 2235  AST  --   --   --   --  40  ALT  --   --   --   --  17  ALKPHOS  --   --   --   --  182*  BILITOT  --   --   --   --  1.1  PROT  --   --   --   --  5.7*  ALBUMIN 2.3* 2.1* 2.1* 2.0* 2.2*   CBG: Recent Labs  Lab 07/16/22 0737 07/16/22 1122 07/16/22 1650 07/16/22 2240 07/17/22 0809  GLUCAP 124* 144* 161* 275* 220*    Discharge time spent: greater than 30 minutes.  Signed: Vernelle Emerald, MD [facility] 07/17/2022

## 2022-07-17 NOTE — Discharge Summary (Incomplete)
Physician Discharge Summary   Patient: [name] [name] MRN: [id number] DOB: 1960-10-08  Admit date:     07/07/2022  Discharge date: {dischdate:26783}  Discharge Physician: Vernelle Emerald   PCP: [name], NP   Recommendations at discharge:  {Tip this will not be part of the note when signed- Example include specific recommendations for outpatient follow-up, pending tests to follow-up on. 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 *   Hospital Course: 61 y.o. male with medical history significant for type 2 diabetes mellitus, Barrett esophagus, chronic hepatitis C, alcohol abuse, poorly healing left TMA with osteomyelitis, recent sternal body fracture 06/30/2022.   Patient also has a history of pancreatic cancer diagnosed June 2022 status post FOLFIRINOX from August 2022 until January 2023.  Patient is status post Whipple February 2023 and chemoradiation June through August 2023.   Patient presented to the emergency department 11/8 with abdominal pain weakness and diarrhea.    CT imaging of the abdomen performed on 11/7 followed by MRI of the abdomen performed on 11/8 revealing  right lobe liver lesion favored by radiology to reflect metastatic disease.  Antibiotics were initially given due to concerns for concurrent cholangitis but were subsequently stopped.    Hospital course was additionally complicated by the additional diagnosis of thrombus in the right portal vein seen on MRI of the abdomen on 11/8 thought to be secondary to hypercoagulability from  malignancy.  Now on Eliquis.    Due to patient's substantial weakness physical therapy has evaluated patient and recommended skilled physical therapy services be provided in a skilled nursing facility.  Furthermore, due to patient's poor prognosis patient was evaluated by palliative care throughout the hospitalization.  Patient voices understanding as to his poor prognosis due to metastatic cancer.  Patient was agreeable to an outpatient hospice referral at time of discharge and referral has since been placed with [facility].    {Tip this will not be part of the note when signed Body mass index is 18.94 kg/m. ,  Nutrition Documentation    Flowsheet Row ED to Hosp-Admission (Current) from 07/07/2022 in [place]  Nutrition Problem Increased nutrient needs  Etiology acute illness, cancer and cancer related treatments  Nutrition Goal Patient will meet greater than or equal to 90% of their needs  Interventions Ensure Enlive (each supplement provides 350kcal and 20 grams of protein), Prostat, MVI     ,  Active Pressure Injury/Wound(s)     Pressure Ulcer  Duration          Pressure Injury 07/16/22 Ankle Left;Lateral Unstageable - Full thickness tissue loss in which the base of the injury is covered by slough (yellow, tan, gray, green or brown) and/or eschar (tan, brown or black) in the wound bed. 1 day   Pressure Injury 07/16/22 Heel Right Deep Tissue Pressure Injury - Purple or maroon localized area of discolored intact skin or blood-filled blister due to damage of underlying soft tissue from pressure and/or shear. 1 day           (  Optional):26781}  {(NOTE) Pain control PDMP Statment (Optional):26782}  Consultants: *** Procedures performed: ***  Disposition: {Plan; Disposition:26390} Diet recommendation:  Discharge Diet Orders (From admission, onward)     Start     Ordered   07/17/22 0000  Diet Carb Modified        07/17/22 1138            {Diet_Plan:26776}  DISCHARGE MEDICATION: Allergies as of 07/17/2022       Reactions   Bee Venom Anaphylaxis   Lactose Intolerance (gi) Other (See Comments)   GI upset     Med Rec must be completed prior to using this [facility]***        Discharge Care Instructions  (From admission, onward)           Start     Ordered   07/17/22 0000  Discharge wound care:       Comments: Wound care to right heel Unstageable pressure injury: wash with soap and water, rinse and pat dry.  Paint with povidone-iodine (betadine) swabstick, allow to dry. When dry, apply silicone foam dressing and place foot into Prevalon boot.  Wound care to left lateral malleolus DTPI:  Cleanse with NS, pat dry. Apply xeroform gauze Kellie Simmering # [id number]) to lesion, cover with dry gauze, secure with Kerlix roll gauze, paper tape.  Place foot into [employer].   07/17/22 1138            Follow-up Information     [name], NP Follow up.   Specialty: Nurse Practitioner Contact information: [address] [address] [phone]                 Discharge Exam: Filed Weights   07/14/22 0432 07/15/22 0421 07/17/22 0500  Weight: 64.2 kg 61.1 kg 61.6 kg   *** Constitutional: Awake alert and oriented x3, no associated distress.   Respiratory: clear to auscultation bilaterally, no wheezing, no crackles. Normal respiratory effort. No accessory muscle use.  Cardiovascular: Regular rate and rhythm, no murmurs / rubs / gallops. No extremity edema. 2+ pedal pulses. No carotid bruits.  Abdomen: Abdomen is soft and nontender.  No evidence of intra-abdominal masses.  Positive bowel sounds noted in all quadrants.   Musculoskeletal: No joint deformity upper and lower extremities. Good ROM, no contractures. Normal muscle tone.     Condition at discharge: {DC Condition:26389}  The results of significant diagnostics from this hospitalization (including imaging, microbiology, ancillary and  laboratory) are listed below for reference.   Imaging Studies: CT Angio Chest PE W and/or Wo Contrast  Result Date: 07/17/2022 CLINICAL DATA:  Pulmonary embolism (PE) suspected, high prob Patient with history of pancreatic cancer. EXAM: CT ANGIOGRAPHY CHEST WITH CONTRAST TECHNIQUE: Multidetector CT imaging of the chest was performed using the standard protocol during bolus administration of intravenous contrast. Multiplanar CT image reconstructions and MIPs were obtained to evaluate the vascular anatomy. RADIATION DOSE REDUCTION: This exam was performed according to the departmental dose-optimization program which includes automated exposure control, adjustment of the mA and/or kV according to patient size and/or use of iterative reconstruction technique. CONTRAST:  151m OMNIPAQUE IOHEXOL 350 MG/ML SOLN COMPARISON:  Noncontrast chest CT 06/30/2022 FINDINGS: Cardiovascular: There are no filling defects within the pulmonary arteries to suggest pulmonary embolus. The heart is normal in size. There is atherosclerosis of the thoracic aorta without acute aortic findings. Right chest port tip is in the SVC. No pericardial effusion. Mediastinum/Nodes: Similar retrosternal stranding related to subacute sternal fracture, no  enlarging mediastinal hematoma. No enlarged mediastinal or hilar lymph nodes. Small hiatal hernia. No suspicious thyroid nodule. Lungs/Pleura: Emphysema. Small bilateral pleural effusions are new from recent CT. The 5 mm left lower lobe nodule series 4, image 106, is unchanged but not well-defined on the current exam due to adjacent atelectasis. Overall increase in streaky subsegmental atelectasis in the lingula and lower lobes from prior. No pneumothorax. Minimal retained mucus in the upper trachea. Upper Abdomen: Assessed on concurrent abdominopelvic CT, reported separately. Musculoskeletal: Unchanged alignment of displaced sternal fracture. No new osseous findings. Review of the MIP images  confirms the above findings. IMPRESSION: 1. No pulmonary embolus. 2. Small bilateral pleural effusions are new from recent CT. Overall increase in streaky subsegmental atelectasis in the lingula and lower lobes from prior. 3. Unchanged alignment of subacute sternal fracture. 4. Unchanged 5 mm left lower lobe pulmonary nodule. Follow-up recommended per oncologic protocol. Aortic Atherosclerosis (ICD10-I70.0) and Emphysema (ICD10-J43.9). Electronically Signed   By: Keith Rake M.D.   On: 07/17/2022 11:35   DG Chest 1 View  Result Date: 07/15/2022 CLINICAL DATA:  Pneumonia EXAM: CHEST  1 VIEW COMPARISON:  07/10/2022 FINDINGS: Single frontal view of the chest demonstrates right chest wall port unchanged. The cardiac silhouette is unremarkable. Lung volumes are diminished, with improved aeration of the lung bases since prior study. Minimal residual basilar atelectasis. Small right pleural effusion has developed in the interim. No pneumothorax. No acute bony abnormalities. IMPRESSION: 1. Small right pleural effusion. 2. Low lung volumes, with persistent but improving bibasilar atelectasis. Electronically Signed   By: Randa Ngo M.D.   On: 07/15/2022 19:59   DG CHEST PORT 1 VIEW  Result Date: 07/10/2022 CLINICAL DATA:  Fever. EXAM: PORTABLE CHEST 1 VIEW COMPARISON:  June 30, 2022. FINDINGS: The heart size and mediastinal contours are within normal limits. Hypoinflation of the lungs is noted with mild bibasilar subsegmental atelectasis and small bilateral pleural effusions. Right internal jugular Port-A-Cath is unchanged in position. The visualized skeletal structures are unremarkable. IMPRESSION: Hypoinflation of the lungs is noted with mild bibasilar subsegmental atelectasis and small pleural effusions. Electronically Signed   By: Marijo Conception M.D.   On: 07/10/2022 11:47   CT ABDOMEN PELVIS W CONTRAST  Addendum Date: 07/09/2022   ADDENDUM REPORT: 07/09/2022 12:22 ADDENDUM: This addendum is  created to clarify a technical issue that occurred during this patient's radiologic imaging. Initially, a different patient was scanned under this patient's name and medical information and was associated with this exam. The report below for this exam is for the correct patient, and this was discussed with the emergency room physician at the time of this exam. Electronically Signed   By: Keith Rake M.D.   On: 07/09/2022 12:22   Result Date: 07/09/2022 CLINICAL DATA:  Acute abdominal pain. Patient with history of pancreatic cancer. EXAM: CT ABDOMEN AND PELVIS WITH CONTRAST TECHNIQUE: Multidetector CT imaging of the abdomen and pelvis was performed using the standard protocol following bolus administration of intravenous contrast. RADIATION DOSE REDUCTION: This exam was performed according to the departmental dose-optimization program which includes automated exposure control, adjustment of the mA and/or kV according to patient size and/or use of iterative reconstruction technique. CONTRAST:  190m OMNIPAQUE IOHEXOL 350 MG/ML SOLN COMPARISON:  Abdominal CT 01/13/2022 FINDINGS: Lower chest: Assessed on concurrent chest CT, reported separately. Hepatobiliary: The previous small subcapsular collection is not definitively seen. There is a 2.5 cm low-density lesion in the right hepatic lobe that is in the region of prior  subcapsular collection. Partially distended gallbladder, question small gallstones. No pericholecystic inflammation. There is no biliary dilatation Pancreas: Pancreatico duodenectomy. No inflammation of the in situ pancreas. No ductal dilatation. Spleen: There is diffuse heterogeneous enhancement of the spleen. Spleen is normal in size. Adrenals/Urinary Tract: 18 mm right adrenal nodule, series 3, image 28. This is new from prior exam. No left adrenal gland. No hydronephrosis. No evidence of renal lesion or stone. There is absent renal excretion on delayed phase imaging. Partially distended urinary  bladder. Mild bladder wall thickening at the dome. Stomach/Bowel: The stomach is partially distended. There are occasional fluid-filled loops of small bowel in the central and lower abdomen, no associated wall thickening or inflammation. No obstruction. Normal appendix. Sigmoid colonic diverticulosis without diverticulitis. Vascular/Lymphatic: Moderate aortic atherosclerosis. The infrarenal aorta measures 3 cm. Mixed calcified and noncalcified atheromatous plaque throughout. The portal vein is patent. The splenic vein is patent. No evidence of mesenteric vein thrombosis. Elongated portal caval node measures 6 mm short axis, series 3, image 27. There is an 8 mm peripancreatic node series 3, image 26. Reproductive: Central prostatic enhancement. Prostate is normal in size. Other: No ascites. No omental thickening. No free air. Musculoskeletal: There is the new lytic lesion within the left aspect of L4 vertebral body measuring at least 2.3 cm suspicious for metastasis. Pathologic fracture through the superior and inferior endplates. Mild T12 compression deformity is new with a lucency in the superior endplate, Schmorl's node versus metastasis. IMPRESSION: 1. Mild bladder wall thickening may represent cystitis. 2. New 18 mm right adrenal nodule, suspicious for metastatic disease. 3. New lytic lesion in the left aspect of L4 vertebral body measuring at least 2.3 cm suspicious for osseous metastasis. Pathologic fracture through the superior and inferior endplates. Mild T12 compression deformity is new from prior exam with a lucency in the superior endplate, Schmorl's node versus metastasis. 4. New 2.5 cm low-density lesion in the right hepatic lobe. This is in the region of prior subcapsular collection, and may represent a metastasis versus new intrahepatic collection. This could be further assessed with MRI as clinically indicated. 5. Heterogeneous enhancement of the spleen, nonspecific. 6. Absent renal excretion on  delayed phase imaging, can be seen with renal dysfunction. 7. Colonic diverticulosis without diverticulitis. Aortic Atherosclerosis (ICD10-I70.0). Electronically Signed: By: Keith Rake M.D. On: 07/07/2022 20:48   MR Abdomen W or Wo Contrast  Result Date: 07/08/2022 CLINICAL DATA:  Liver mass on CT scan. History of Whipple procedure for pancreatic cancer. EXAM: MRI ABDOMEN WITHOUT AND WITH CONTRAST TECHNIQUE: Multiplanar multisequence MR imaging of the abdomen was performed both before and after the administration of intravenous contrast. CONTRAST:  5.36m GADAVIST GADOBUTROL 1 MMOL/ML IV SOLN COMPARISON:  CT scan 07/07/2022 FINDINGS: Lower chest: Unremarkable. Hepatobiliary: 6.1 x 5.1 cm irregular heterogeneously enhancing mass lesion is identified in the dome of the posterior right liver. There appears to be thrombus within a branch of the portal vein along the anteroinferior margin of this lesion (see delayed 90 second postcontrast image 33 of series 16 and coronal postcontrast image 33 of series 18). A second 1.6 cm lesion in the anterior right liver (axial T2 image 15 of series 5) also shows heterogeneous enhancement after IV contrast administration. Both of these lesions restrict diffusion. There is a third focus of restricted diffusion in the subcapsular aspect of the anterior left liver corresponding to a subtle T2 hyperintensity measuring 7 mm on image 16/5. Cholecystectomy. No intrahepatic or extrahepatic biliary dilation. Pancreas: Status post pancreatic head  resection. Fine detail of pancreatic parenchyma obscured by motion artifact. Spleen:  No splenomegaly. No focal mass lesion. Adrenals/Urinary Tract: No adrenal nodule or mass. Kidneys unremarkable. Stomach/Bowel: Surgical changes in the stomach compatible with reported history of Whipple procedure. Mild diffuse gaseous distention of small bowel and colon. Vascular/Lymphatic: No abdominal aortic aneurysm. Main portal vein is patent. Small  portal caval and retrocaval lymph nodes evident. Other: Small to moderate volume ascites with diffuse mesenteric and soft tissue edema. Musculoskeletal: No focal suspicious marrow enhancement within the visualized bony anatomy. IMPRESSION: 1. 6.1 x 5.1 cm irregular heterogeneously enhancing mass lesion in the dome of the posterior right liver compatible with metastatic disease. There appears to be thrombus within a right portal vein branch inferior to this lesion. 2. Additional 1.6 cm and 0.7 cm hepatic lesions are strict diffusion and are also suspicious for metastatic disease. 3. Sequelae of Whipple procedure. 4. Small to moderate volume ascites with diffuse mesenteric and soft tissue edema. 5. Small portal caval and retrocaval lymph nodes evident. Electronically Signed   By: Misty Stanley M.D.   On: 07/08/2022 07:45   CT ABDOMEN PELVIS W CONTRAST  Result Date: 07/07/2022 CLINICAL DATA:  Acute abdominal pain. History of pancreatic cancer. EXAM: CT ABDOMEN AND PELVIS WITH CONTRAST TECHNIQUE: Multidetector CT imaging of the abdomen and pelvis was performed using the standard protocol following bolus administration of intravenous contrast. RADIATION DOSE REDUCTION: This exam was performed according to the departmental dose-optimization program which includes automated exposure control, adjustment of the mA and/or kV according to patient size and/or use of iterative reconstruction technique. CONTRAST:  167m OMNIPAQUE IOHEXOL 350 MG/ML SOLN COMPARISON:  CT 01/13/2022 FINDINGS: Lower chest: Assessed on concurrent chest CT, reported separately. Hepatobiliary: Lobulated 5.5 cm low-density lesion in the right lobe of the liver is new from May exam and suspicious for metastasis. Tiny hypodense focus centrally series 4, image 22 is at site of prior enhancement and likely a tiny hemangioma. Overall heterogeneous enhancement of the periphery of the right hepatic lobe, additional subtle lesions are not entirely excluded.  The previous subcapsular fluid collection has resolved, however there is new moderate volume of ascites. Cholecystectomy. There is enhancement of the common bile duct which is nondilated. Pancreas: Partial pancreatectomy. The in situ pancreas is atrophic. Pancreatic duct measures 5 mm, previously 4 mm. No evidence of recurrent pancreatic lesion. Spleen: Normal in size without focal abnormality. Adrenals/Urinary Tract: Normal adrenal glands. There is no hydronephrosis. No renal calculi. No evidence of focal renal abnormality. Partially distended urinary bladder, unremarkable for degree of distension. Stomach/Bowel: Small hiatal hernia. There is mild enhancement of the gastric mucosa. Partial gastrectomy and pancreatico duodenectomy. The gastrojejunal anastomosis is patent. Small bowel is diffusely fluid-filled with mild wall thickening and enhancement. No pattern of obstruction. The appendix is not confidently visualized. Small to moderate colonic stool burden. Sigmoid diverticulosis without diverticulitis. Vascular/Lymphatic: Moderate to advanced aortic atherosclerosis. No aortic aneurysm. The portal vein is patent. Splenic vein is patent. No mesenteric vein thrombosis. Small porta hepatis nodes are all subcentimeter and not enlarged by size criteria. Reproductive: Prostate is unremarkable. Other: Moderate volume of ascites, new from May exam. There is no convincing omental thickening. No free air. Musculoskeletal: Lower lumbar facet hypertrophy. No focal bone lesion. IMPRESSION: 1. New 5.5 cm low-density lesion in the right lobe of the liver. In the setting of history prior pancreatic malignancy, metastasis is favored, however there is enhancement of the common bile duct which raises the possibility of ascending cholangitis and  hepatic abscess. Additionally there is ill-defined heterogeneous enhancement in the periphery of the right hepatic lobe which is nonspecific. Recommend hepatic protocol MRI for further  assessment. 2. Moderate volume of ascites, new from May exam. 3. Post partial pancreatectomy and without evidence of recurrent pancreatic lesion. 4. Fluid-filled small bowel with mild wall thickening and enhancement, suggesting diffuse enteritis. 5. Sigmoid diverticulosis without diverticulitis. Aortic Atherosclerosis (ICD10-I70.0). These results were called by telephone at the time of interpretation on 07/07/2022 at 11:10 pm to provider DAVID YAO , who verbally acknowledged these results. Electronically Signed   By: Keith Rake M.D.   On: 07/07/2022 23:10   CT Angio Chest PE W and/or Wo Contrast  Result Date: 07/07/2022 CLINICAL DATA:  Pulmonary embolism (PE) suspected, high prob Patient with history of pancreatic cancer. EXAM: CT ANGIOGRAPHY CHEST WITH CONTRAST TECHNIQUE: Multidetector CT imaging of the chest was performed using the standard protocol during bolus administration of intravenous contrast. Multiplanar CT image reconstructions and MIPs were obtained to evaluate the vascular anatomy. RADIATION DOSE REDUCTION: This exam was performed according to the departmental dose-optimization program which includes automated exposure control, adjustment of the mA and/or kV according to patient size and/or use of iterative reconstruction technique. CONTRAST:  139m OMNIPAQUE IOHEXOL 350 MG/ML SOLN COMPARISON:  Noncontrast chest CT 06/30/2022 FINDINGS: Cardiovascular: There are no filling defects within the pulmonary arteries to suggest pulmonary embolus. The heart is normal in size. There is atherosclerosis of the thoracic aorta without acute aortic findings. Right chest port tip is in the SVC. No pericardial effusion. Mediastinum/Nodes: Similar retrosternal stranding related to subacute sternal fracture, no enlarging mediastinal hematoma. No enlarged mediastinal or hilar lymph nodes. Small hiatal hernia. No suspicious thyroid nodule. Lungs/Pleura: Emphysema. Small bilateral pleural effusions are new from  recent CT. The 5 mm left lower lobe nodule series 4, image 106, is unchanged but not well-defined on the current exam due to adjacent atelectasis. Overall increase in streaky subsegmental atelectasis in the lingula and lower lobes from prior. No pneumothorax. Minimal retained mucus in the upper trachea. Upper Abdomen: Assessed on concurrent abdominopelvic CT, reported separately. Musculoskeletal: Unchanged alignment of displaced sternal fracture. No new osseous findings. Review of the MIP images confirms the above findings. IMPRESSION: 1. No pulmonary embolus. 2. Small bilateral pleural effusions are new from recent CT. Overall increase in streaky subsegmental atelectasis in the lingula and lower lobes from prior. 3. Unchanged alignment of subacute sternal fracture. 4. Unchanged 5 mm left lower lobe pulmonary nodule. Follow-up recommended per oncologic protocol. Aortic Atherosclerosis (ICD10-I70.0) and Emphysema (ICD10-J43.9). Electronically Signed   By: MKeith RakeM.D.   On: 07/07/2022 23:00   CT Chest Wo Contrast  Result Date: 06/30/2022 CLINICAL DATA:  Blunt chest trauma. Fall yesterday. Anterior chest pain. Sternal fracture on radiograph. 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 EXAM: CT CHEST WITHOUT CONTRAST TECHNIQUE: Multidetector CT imaging of the chest was performed following the standard protocol without IV contrast. RADIATION DOSE REDUCTION: This exam was performed according to the departmental dose-optimization program which includes automated exposure control, adjustment of the mA and/or kV according to patient size and/or use of iterative reconstruction technique. COMPARISON:  Radiograph earlier today.  Chest CT 01/05/2022 FINDINGS: Cardiovascular: Right chest port tip in the lower SVC. Moderate atherosclerosis of the thoracic aorta. No periaortic stranding. Aortic tortuosity. The heart is normal in size. There are coronary artery calcifications. Trace pericardial  effusion, improved from prior CT. Mediastinum/Nodes: Trace mediastinal stranding related to sternal fracture but no mediastinal  hematoma. No pneumomediastinum. Stable small mediastinal lymph nodes from prior exam. No suspicious adenopathy. Small hiatal hernia, otherwise unremarkable esophagus. Lungs/Pleura: No pneumothorax. No pulmonary contusion. Mild emphysema with biapical pleuroparenchymal scarring. Linear bandlike opacities in the lingula, left lower and left upper lobes. Adjacent to left lower lobe atelectasis is a 5 mm left lower lobe nodule series 5, image 135. No pleural fluid. Upper Abdomen: No acute upper abdominal findings on this unenhanced exam. Musculoskeletal: Displaced sternal body fracture with 1/2 shaft width posterior displacement of upper sternal fracture fragment. No evidence of underlying sternal lesion. No acute rib fractures. Thoracic spondylosis. 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 IMPRESSION: 1. Displaced sternal body fracture. Minimal retrosternal stranding but no confluent hematoma. 2. No additional acute traumatic injury to the chest. 3. Linear bandlike opacities in the lingula, left lower and left upper lobes, likely atelectasis. Adjacent to left lower lobe atelectasis is a 5 mm left lower lobe nodule. Given malignancy history, follow-up per oncologic protocol. 4. Aortic atherosclerosis.  Coronary artery calcifications. 5. Emphysema. Emphysema (ICD10-J43.9).  Aortic Atherosclerosis (ICD10-I70.0). Electronically Signed   By: Keith Rake M.D.   On: 06/30/2022 16:27   DG Chest 2 View  Result Date: 06/30/2022 CLINICAL DATA:  Trauma, fall, chest pain EXAM: CHEST - 2 VIEW COMPARISON:  01/05/2022 FINDINGS: Cardiac size is within normal limits. There are no signs of pulmonary edema or focal pulmonary consolidation. There is no pleural effusion or pneumothorax. Tip of right IJ chest port is seen in superior vena cava. Deformities are seen in the  lateral aspect of left eighth and ninth ribs. There is fracture in the body of sternum with 10 mm offset in alignment. Deformity in the proximal right humerus may be residual from previous injury. IMPRESSION: There is displaced fracture in the body of sternum. Deformities are seen in the lateral aspect of left eighth and ninth ribs suggesting recent or old fractures. There is no focal pulmonary consolidation. There is no pleural effusion or pneumothorax. These results will be called to the ordering clinician or representative by the Radiologist Assistant, and communication documented in the PACS or [employer]. Electronically Signed   By: Elmer Picker M.D.   On: 06/30/2022 12:24   DG Foot Complete Left  Result Date: 06/30/2022 Please see detailed radiograph report in office note.  PERIPHERAL VASCULAR CATHETERIZATION  Result Date: 06/24/2022 Images from the original result were not included. Patient name: KENNETT SYMES MRN: [id number] DOB: 05-17-1961 Sex: male 06/24/2022 Pre-operative Diagnosis: Left lower extremity nonhealing wound Post-operative diagnosis:  Same Surgeon:  Broadus John, MD Procedure Performed: 1.  Ultrasound-guided micropuncture access of the right common femoral artery 2.  Aortogram 3.  Second-order cannulation, left lower extremity angiogram 4.  Moderate sedation time 23 minutes 5.  Contrast volume 55 mL 6.  Device assisted closure-Mynx Indications:  DYER KLUG is a 61 y.o. male presenting with nonhealing left TMA.  TMA was performed roughly 1 year ago, with recent debridement at [facility] for osteomyelitis.  He is currently being treated with IV antibiotics.  I had a long conversation with [name] regarding his left TMA wound, as well as his life expectancy associated with pancreatic cancer.  [name] he stated he would like to do everything in an effort to continue ambulating until he passes away.  I discussed that with a palpable pulse at the level of the ankle, it is  likely small vessel disease from diabetes.  His ambulation without proper support, continued smoking, malnutrition from pancreatic  cancer, and compression dressing are all hindering wound healing.  We discussed left lower extremity angiography in an effort to define and improve perfusion for left leg wound healing.  I will attempt endovascular invention if he has lesions amenable.  Should he have lesions needing operative repair, [name] understands that surgery will not be offered due to his <12 month life expectancy from stage IV pancreatic cancer Findings: Aortogram: No flow-limiting stenosis in the aortoiliac segment bilaterally On the left: Widely patent common femoral artery, profunda, superficial femoral artery, popliteal artery.  Two-vessel runoff to the foot-anterior tibial artery, posterior tibial artery with intact pedal arch.  Atretic peroneal artery which occludes in the mid tibia.  Procedure:  The patient was identified in the holding area and taken to room 8.  The patient was then placed supine on the table and prepped and draped in the usual sterile fashion.  A time out was called.  Ultrasound was used to evaluate the right common femoral artery.  It was patent .  A digital ultrasound image was acquired.  A micropuncture needle was used to access the right common femoral artery under ultrasound guidance.  An 018 wire was advanced without resistance and a micropuncture sheath was placed.  The 018 wire was removed and a benson wire was placed.  The micropuncture sheath was exchanged for a 5 french sheath.  An omniflush catheter was advanced over the wire to the level of L-1.  An abdominal angiogram was obtained.  Next, using the omniflush catheter and a benson wire, the aortic bifurcation was crossed and the catheter was placed into theleft external iliac artery and left runoff was obtained. See results above.  No intervention performed. Impression: Inflow widely patent with two-vessel runoff to the  foot.  In the foot there is a filling of plantar arteries with an intact pedal arch. Patient did not require intervention. Cassandria Santee, MD [facility] Office: [phone]   VAS [place] ABI WITH/WO TBI  Result Date: 06/19/2022  LOWER EXTREMITY DOPPLER STUDY Patient Name:  SHERRY ROGUS  Date of Exam:   06/19/2022 Medical Rec #: [id number]        Accession #:    [id number] Date of Birth: 04/19/1961        Patient Gender: M Patient Age:   34 years Exam Location:  [facility] Procedure:      VAS [place] ABI WITH/WO TBI Referring Phys: JOSHUA ROBINS --------------------------------------------------------------------------------  Indications: Peripheral artery disease. Discoloration of the right end and 5th              toe. High Risk Factors: Hypertension, hyperlipidemia, Diabetes, current smoker.  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 Performing Technologist: Alvia Grove RVT  Examination Guidelines: A complete evaluation includes at minimum, Doppler waveform signals and systolic blood pressure reading at the level of bilateral brachial, anterior tibial, and posterior tibial arteries, when vessel segments are accessible. Bilateral testing is considered an integral part of a complete examination. Photoelectric Plethysmograph (PPG) waveforms and toe systolic pressure readings are included as required and additional duplex testing as needed. Limited examinations for reoccurring indications may be performed as noted.  ABI Findings: +---------+------------------+-----+---------+--------+ Right    Rt Pressure (mmHg)IndexWaveform Comment  +---------+------------------+-----+---------+--------+ Brachial 172                                      +---------+------------------+-----+---------+--------+ PTA  207               1.20 triphasic         +---------+------------------+-----+---------+--------+ DP       190               1.10  triphasic         +---------+------------------+-----+---------+--------+ Great Toe153               0.89 Normal            +---------+------------------+-----+---------+--------+ +---------+------------------+-----+--------+---------+ Left     Lt Pressure (mmHg)IndexWaveformComment   +---------+------------------+-----+--------+---------+ Brachial 164                                      +---------+------------------+-----+--------+---------+ PTA      197               1.15 biphasic          +---------+------------------+-----+--------+---------+ DP       208               1.21 biphasic          +---------+------------------+-----+--------+---------+ Great Toe                               amputated +---------+------------------+-----+--------+---------+ +-------+-----------+-----------+------------+------------+ ABI/TBIToday's ABIToday's TBIPrevious ABIPrevious TBI +-------+-----------+-----------+------------+------------+ Right  1.20       0.89                                +-------+-----------+-----------+------------+------------+ Left   1.21       amputation                          +-------+-----------+-----------+------------+------------+   Summary: Right: Resting right ankle-brachial index is within normal range. The right toe-brachial index is normal. Left: Resting left ankle-brachial index is within normal range. *See table(s) above for measurements and observations.  Electronically signed by Orlie Pollen on 06/19/2022 at 7:56:54 AM.    Final     Microbiology: Results for orders placed or performed during the hospital encounter of 07/07/22  Gastrointestinal Panel by PCR , Stool     Status: None   Collection Time: 07/08/22 10:28 PM   Specimen: Stool  Result Value Ref Range Status   Campylobacter species NOT DETECTED NOT DETECTED Final   Plesimonas shigelloides NOT DETECTED NOT DETECTED Final   Salmonella species NOT DETECTED NOT  DETECTED Final   Yersinia enterocolitica NOT DETECTED NOT DETECTED Final   Vibrio species NOT DETECTED NOT DETECTED Final   Vibrio cholerae NOT DETECTED NOT DETECTED Final   Enteroaggregative E coli (EAEC) NOT DETECTED NOT DETECTED Final   Enteropathogenic E coli (EPEC) NOT DETECTED NOT DETECTED Final   Enterotoxigenic E coli (ETEC) NOT DETECTED NOT DETECTED Final   Shiga like toxin producing E coli (STEC) NOT DETECTED NOT DETECTED Final   Shigella/Enteroinvasive E coli (EIEC) NOT DETECTED NOT DETECTED Final   Cryptosporidium NOT DETECTED NOT DETECTED Final   Cyclospora cayetanensis NOT DETECTED NOT DETECTED Final   Entamoeba histolytica NOT DETECTED NOT DETECTED Final   Giardia lamblia NOT DETECTED NOT DETECTED Final   Adenovirus F40/41 NOT DETECTED NOT DETECTED Final   Astrovirus NOT DETECTED NOT DETECTED Final   Norovirus GI/GII NOT DETECTED NOT DETECTED Final  Rotavirus A NOT DETECTED NOT DETECTED Final   Sapovirus (I, II, IV, and V) NOT DETECTED NOT DETECTED Final    Comment: Performed at [facility], [address] [place], [place] [address]  C Difficile Quick Screen w PCR reflex     Status: None   Collection Time: 07/08/22 10:28 PM   Specimen: Stool  Result Value Ref Range Status   C Diff antigen NEGATIVE NEGATIVE Final   C Diff toxin NEGATIVE NEGATIVE Final   C Diff interpretation No C. difficile detected.  Final    Comment: Performed at [facility], [place] [address], [place], [place] [address]  Urine Culture     Status: None   Collection Time: 07/15/22 10:35 PM   Specimen: Urine, Clean Catch  Result Value Ref Range Status   Specimen Description   Final    URINE, CLEAN CATCH Performed at [facility], [place] [address] [place], [place] [address]    Special Requests   Final    NONE Performed at [facility], [place] [address], [place], [place] [address]    Culture   Final    NO GROWTH Performed at [facility], [place] [address], [place], [place] [address]    Report Status 07/17/2022 FINAL  Final   *Note: Due to a large number of results and/or encounters for the requested time period, some results have not been displayed. A complete set of results can be found in Results Review.    Labs: CBC: Recent Labs  Lab 07/12/22 0544 07/13/22 0824 07/14/22 0445 07/15/22 0438 07/15/22 2235  WBC 8.0 10.0 6.4 7.6 8.8  NEUTROABS  --   --   --   --  6.6  HGB 8.4* 9.1* 8.0* 7.6* 8.0*  HCT 24.5* 27.3* 24.3* 23.5* 24.9*  MCV 96.5 98.9 99.6 101.3* 102.5*  PLT 109* 130* 119* 120* 001   Basic Metabolic Panel: Recent Labs  Lab 07/11/22 0616 07/11/22 0617 07/11/22 0617 07/12/22 0544 07/12/22 0545 07/13/22 0824 07/13/22 1835 07/14/22 0445 07/15/22 0438 07/15/22 2235  NA  --  135   < >  --  134*  --  136 134* 136 133*  K  --  3.5   < >  --  3.8  --  3.7 3.6 3.8 3.4*  CL  --  104   < >  --  104  --  106 104 105 105  CO2  --  25   < >  --  23  --  '25 25 26 26  '$ GLUCOSE  --  131*   < >  --  132*  --  226* 166* 189* 164*  BUN  --  6   < >  --  7  --  '9 10 12 13  '$ CREATININE  --  0.36*   < >  --  0.35*  --  0.50* 0.41* 0.41* 0.45*  CALCIUM  --  7.6*   < >  --  7.4*  --  7.5* 7.4* 7.3* 7.4*  MG 1.7  --   --  1.7  --  1.7  --  1.7  --  1.8  PHOS  --  2.5  --   --  2.8  --  2.7 2.5 2.7  --    < > = values in this interval not displayed.   Liver Function Tests: Recent Labs  Lab 07/12/22 0545 07/13/22 1835 07/14/22 0445 07/15/22 0438 07/15/22 2235  AST  --   --   --   --  40  ALT  --   --   --   --  17  ALKPHOS  --   --   --   --  182*  BILITOT  --   --   --   --  1.1  PROT  --   --   --   --  5.7*  ALBUMIN 2.3* 2.1* 2.1* 2.0* 2.2*   CBG: Recent Labs  Lab 07/16/22 0737 07/16/22 1122 07/16/22 1650 07/16/22 2240 07/17/22 0809  GLUCAP 124* 144* 161* 275* 220*    Discharge time spent: {LESS THAN/GREATER QFDV:44514} 30 minutes.  Signed: Vernelle Emerald, MD [facility] 07/17/2022

## 2022-07-17 NOTE — Progress Notes (Signed)
Report called to [facility] to nurse Zenette.  [facility] to transport patient.

## 2022-07-17 NOTE — Plan of Care (Signed)
  Problem: Activity: Goal: Ability to return to baseline activity level will improve Outcome: Progressing   Problem: Skin Integrity: Goal: Risk for impaired skin integrity will decrease Outcome: Progressing   Problem: Elimination: Goal: Will not experience complications related to bowel motility Outcome: Progressing   Problem: Pain Managment: Goal: General experience of comfort will improve Outcome: Progressing   Problem: Safety: Goal: Ability to remain free from injury will improve Outcome: Progressing

## 2022-07-20 ENCOUNTER — Other Ambulatory Visit: Payer: Self-pay | Admitting: Nurse Practitioner

## 2022-07-20 ENCOUNTER — Other Ambulatory Visit: Payer: Self-pay | Admitting: Physician Assistant

## 2022-07-20 ENCOUNTER — Other Ambulatory Visit: Payer: Self-pay

## 2022-07-20 ENCOUNTER — Other Ambulatory Visit: Payer: Self-pay | Admitting: Family Medicine

## 2022-07-20 ENCOUNTER — Other Ambulatory Visit (HOSPITAL_COMMUNITY): Payer: Self-pay

## 2022-07-20 DIAGNOSIS — F5101 Primary insomnia: Secondary | ICD-10-CM

## 2022-07-20 DIAGNOSIS — F17200 Nicotine dependence, unspecified, uncomplicated: Secondary | ICD-10-CM

## 2022-07-20 DIAGNOSIS — C250 Malignant neoplasm of head of pancreas: Secondary | ICD-10-CM

## 2022-07-20 DIAGNOSIS — F10982 Alcohol use, unspecified with alcohol-induced sleep disorder: Secondary | ICD-10-CM

## 2022-07-20 MED ORDER — PREGABALIN 75 MG PO CAPS
75.0000 mg | ORAL_CAPSULE | Freq: Every day | ORAL | 1 refills | Status: AC
  Filled 2022-07-20: qty 30, 30d supply, fill #0

## 2022-07-20 MED ORDER — MIRTAZAPINE 30 MG PO TABS
30.0000 mg | ORAL_TABLET | Freq: Every day | ORAL | 1 refills | Status: AC
  Filled 2022-07-20: qty 90, 90d supply, fill #0

## 2022-07-20 MED ORDER — ONDANSETRON HCL 8 MG PO TABS
8.0000 mg | ORAL_TABLET | Freq: Two times a day (BID) | ORAL | 1 refills | Status: AC | PRN
  Filled 2022-07-20: qty 30, 15d supply, fill #0

## 2022-07-20 MED ORDER — THEREMS-M PO TABS
1.0000 | ORAL_TABLET | Freq: Every morning | ORAL | 0 refills | Status: AC
  Filled 2022-07-20: qty 90, 90d supply, fill #0

## 2022-07-20 MED ORDER — ALBUTEROL SULFATE HFA 108 (90 BASE) MCG/ACT IN AERS
INHALATION_SPRAY | RESPIRATORY_TRACT | 0 refills | Status: AC
  Filled 2022-07-20: qty 18, 25d supply, fill #0

## 2022-07-20 NOTE — Telephone Encounter (Signed)
Requested medication (s) are due for refill today -expired Rx, yes  Requested medication (s) are on the active medication list -yes  Future visit scheduled -yes  Last refill: multi vit- 07/04/21 #90 3RF- expired Rx                 Zolpidem- 05/27/22 #30 1RF- non delegated Rx                  Notes to clinic: see above  Requested Prescriptions  Pending Prescriptions Disp Refills   Multiple Vitamins-Minerals (THEREMS-M) TABS 90 tablet 3    Sig: Take 1 tablet by mouth in the morning.     There is no refill protocol information for this order     zolpidem (AMBIEN) 10 MG tablet 30 tablet 1    Sig: Take 1 tablet (10 mg total) by mouth at bedtime as needed for sleep.     Not Delegated - Psychiatry:  Anxiolytics/Hypnotics Failed - 07/20/2022  9:13 AM      Failed - This refill cannot be delegated      Passed - Urine Drug Screen completed in last 360 days      Passed - Valid encounter within last 6 months    Recent Outpatient Visits           2 months ago Encounter for annual physical exam   [place], Vernia Buff, NP   5 months ago Hospital discharge follow-up   [place], NP   7 months ago Essential hypertension   [place], Vernia Buff, NP   9 months ago Loose stools   [place], Vernia Buff, NP   1 year ago GERD without esophagitis   [place], Vernia Buff, NP       Future Appointments             In 3 weeks Werner Lean, MD [place]. [facility]   In 3 months [name], NP [place]               Requested Prescriptions  Pending Prescriptions Disp Refills   Multiple Vitamins-Minerals (THEREMS-M) TABS 90 tablet 3    Sig: Take 1 tablet by mouth in the morning.      There is no refill protocol information for this order     zolpidem (AMBIEN) 10 MG tablet 30 tablet 1    Sig: Take 1 tablet (10 mg total) by mouth at bedtime as needed for sleep.     Not Delegated - Psychiatry:  Anxiolytics/Hypnotics Failed - 07/20/2022  9:13 AM      Failed - This refill cannot be delegated      Passed - Urine Drug Screen completed in last 360 days      Passed - Valid encounter within last 6 months    Recent Outpatient Visits           2 months ago Encounter for annual physical exam   [place], Vernia Buff, NP   5 months ago Hospital discharge follow-up   [place], NP   7 months ago Essential hypertension   [place], Zelda W, NP   9  months ago Loose stools   [place], Vernia Buff, NP   1 year ago GERD without esophagitis   [place], Vernia Buff, NP       Future Appointments             In 3 weeks [place] Sells, Terisa Starr, MD [place]. [facility], [facility]   In 3 months [name], NP [place]

## 2022-07-20 NOTE — Telephone Encounter (Signed)
Requested Prescriptions  Pending Prescriptions Disp Refills   mirtazapine (REMERON) 30 MG tablet 90 tablet 1    Sig: Take 1 tablet (30 mg total) by mouth at bedtime.     Psychiatry: Antidepressants - mirtazapine Passed - 07/20/2022  9:13 AM      Passed - Completed PHQ-2 or PHQ-9 in the last 360 days      Passed - Valid encounter within last 6 months    Recent Outpatient Visits           2 months ago Encounter for annual physical exam   [place] [name], NP   5 months ago Hospital discharge follow-up   [place], NP   7 months ago Essential hypertension   [place], Vernia Buff, NP   9 months ago Loose stools   [place], Vernia Buff, NP   1 year ago GERD without esophagitis   [place], Vernia Buff, NP       Future Appointments             In 3 weeks Werner Lean, MD [place]. [facility], [facility]   In 3 months [name], NP [place]

## 2022-07-21 MED ORDER — ZOLPIDEM TARTRATE 10 MG PO TABS
10.0000 mg | ORAL_TABLET | Freq: Every evening | ORAL | 1 refills | Status: DC | PRN
  Filled 2022-07-21 – 2022-07-27 (×2): qty 30, 30d supply, fill #0

## 2022-07-22 ENCOUNTER — Telehealth: Payer: Self-pay | Admitting: *Deleted

## 2022-07-22 ENCOUNTER — Encounter: Payer: Self-pay | Admitting: Nurse Practitioner

## 2022-07-22 ENCOUNTER — Encounter: Payer: Self-pay | Admitting: Hematology and Oncology

## 2022-07-22 ENCOUNTER — Other Ambulatory Visit: Payer: Self-pay | Admitting: Hematology and Oncology

## 2022-07-22 ENCOUNTER — Other Ambulatory Visit (HOSPITAL_COMMUNITY): Payer: Self-pay

## 2022-07-22 MED ORDER — OXYCODONE HCL 10 MG PO TABS
10.0000 mg | ORAL_TABLET | Freq: Four times a day (QID) | ORAL | 0 refills | Status: DC | PRN
  Filled 2022-07-22: qty 120, 30d supply, fill #0

## 2022-07-22 MED ORDER — OXYCODONE HCL 10 MG PO TABS: 10.0000 mg | ORAL_TABLET | Freq: Four times a day (QID) | ORAL | 0 refills | Status: DC | PRN

## 2022-07-22 NOTE — Telephone Encounter (Signed)
Received call from patient. He is back home from rehab as of Sunday. He states he needs a refill of his Oxycodone 10 mg. States it was written for 1 every 6 hours prn pain. This goes to [facility]  Please advise as mto when you want to see pt in clinic again

## 2022-07-27 ENCOUNTER — Encounter: Payer: Self-pay | Admitting: Hematology and Oncology

## 2022-07-27 ENCOUNTER — Other Ambulatory Visit (HOSPITAL_COMMUNITY): Payer: Self-pay

## 2022-07-28 ENCOUNTER — Telehealth: Payer: Self-pay | Admitting: Family Medicine

## 2022-07-28 NOTE — Telephone Encounter (Signed)
Phone call notification from pt at end of last week that he had left the facility "because it smelled and the food was bad" and was back home in his apartment following his hospitalization after a fall with mildly displaced sternal fracture. Pt had + alcohol screen on admission and despite being prescribed opiods had a negative urine drug screen for opiods.  Had initially been informed from hospital d/c plan that pt would go to SNF with hospice services.  When asking pt if he was not on Hospice, he states "I'm not dying."  Had advised pt at the time that I would follow up to see who would be seeing him.  Contacted referral center and spoke with Melba Coon who said that there was a referral for Hospice but pt had never been opened to Hospice.  Spoke with Melvyn Neth, NP in [place] who states pt declined Hospice services after initial agreement.  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  He also has cancer related diarrhea, recently requiring IV fluids and had suffered a fall at home off the commode hitting his chest and causing a 10 mm posteriorly displaced sternal fracture, was evaluated in ED and found to have no hematoma but was having significant issues with pain and mobility which is what resulted in him being sent to SNF/rehab.  He had not been there a week when he called stating he was home.  Notified Daryl Eastern, RN of clinician team who will be assuming care of the patient of above information and need to follow up in the next week, if if possible.  Damaris Hippo FNP-C                                                                            ,

## 2022-08-10 ENCOUNTER — Other Ambulatory Visit: Payer: Self-pay | Admitting: Physician Assistant

## 2022-08-10 DIAGNOSIS — C250 Malignant neoplasm of head of pancreas: Secondary | ICD-10-CM

## 2022-08-11 ENCOUNTER — Other Ambulatory Visit: Payer: Self-pay

## 2022-08-11 ENCOUNTER — Inpatient Hospital Stay (HOSPITAL_BASED_OUTPATIENT_CLINIC_OR_DEPARTMENT_OTHER): Payer: Medicaid Other | Admitting: Nurse Practitioner

## 2022-08-11 ENCOUNTER — Encounter: Payer: Self-pay | Admitting: Hematology and Oncology

## 2022-08-11 ENCOUNTER — Inpatient Hospital Stay (HOSPITAL_BASED_OUTPATIENT_CLINIC_OR_DEPARTMENT_OTHER): Payer: Medicaid Other | Admitting: Physician Assistant

## 2022-08-11 ENCOUNTER — Encounter: Payer: Self-pay | Admitting: Nurse Practitioner

## 2022-08-11 ENCOUNTER — Other Ambulatory Visit (HOSPITAL_COMMUNITY): Payer: Self-pay

## 2022-08-11 ENCOUNTER — Inpatient Hospital Stay: Payer: Medicaid Other | Attending: Hematology and Oncology

## 2022-08-11 VITALS — BP 146/95 | HR 125 | Temp 97.5°F | Resp 17 | Wt 125.3 lb

## 2022-08-11 DIAGNOSIS — C787 Secondary malignant neoplasm of liver and intrahepatic bile duct: Secondary | ICD-10-CM | POA: Diagnosis present

## 2022-08-11 DIAGNOSIS — K8689 Other specified diseases of pancreas: Secondary | ICD-10-CM | POA: Diagnosis not present

## 2022-08-11 DIAGNOSIS — I81 Portal vein thrombosis: Secondary | ICD-10-CM | POA: Insufficient documentation

## 2022-08-11 DIAGNOSIS — Z7982 Long term (current) use of aspirin: Secondary | ICD-10-CM | POA: Diagnosis not present

## 2022-08-11 DIAGNOSIS — F17210 Nicotine dependence, cigarettes, uncomplicated: Secondary | ICD-10-CM | POA: Insufficient documentation

## 2022-08-11 DIAGNOSIS — Z515 Encounter for palliative care: Secondary | ICD-10-CM | POA: Diagnosis not present

## 2022-08-11 DIAGNOSIS — Z794 Long term (current) use of insulin: Secondary | ICD-10-CM | POA: Diagnosis not present

## 2022-08-11 DIAGNOSIS — C250 Malignant neoplasm of head of pancreas: Secondary | ICD-10-CM

## 2022-08-11 DIAGNOSIS — Z9221 Personal history of antineoplastic chemotherapy: Secondary | ICD-10-CM | POA: Insufficient documentation

## 2022-08-11 DIAGNOSIS — Z95828 Presence of other vascular implants and grafts: Secondary | ICD-10-CM

## 2022-08-11 DIAGNOSIS — R531 Weakness: Secondary | ICD-10-CM

## 2022-08-11 DIAGNOSIS — Z79899 Other long term (current) drug therapy: Secondary | ICD-10-CM | POA: Insufficient documentation

## 2022-08-11 DIAGNOSIS — R634 Abnormal weight loss: Secondary | ICD-10-CM

## 2022-08-11 DIAGNOSIS — E119 Type 2 diabetes mellitus without complications: Secondary | ICD-10-CM | POA: Insufficient documentation

## 2022-08-11 DIAGNOSIS — Z7189 Other specified counseling: Secondary | ICD-10-CM

## 2022-08-11 DIAGNOSIS — G893 Neoplasm related pain (acute) (chronic): Secondary | ICD-10-CM | POA: Diagnosis not present

## 2022-08-11 DIAGNOSIS — Z7901 Long term (current) use of anticoagulants: Secondary | ICD-10-CM | POA: Insufficient documentation

## 2022-08-11 DIAGNOSIS — R530 Neoplastic (malignant) related fatigue: Secondary | ICD-10-CM

## 2022-08-11 DIAGNOSIS — R630 Anorexia: Secondary | ICD-10-CM

## 2022-08-11 LAB — CBC WITH DIFFERENTIAL (CANCER CENTER ONLY)
Abs Immature Granulocytes: 0.03 10*3/uL (ref 0.00–0.07)
Basophils Absolute: 0.1 10*3/uL (ref 0.0–0.1)
Basophils Relative: 1 %
Eosinophils Absolute: 0.2 10*3/uL (ref 0.0–0.5)
Eosinophils Relative: 2 %
HCT: 31.7 % — ABNORMAL LOW (ref 39.0–52.0)
Hemoglobin: 10.7 g/dL — ABNORMAL LOW (ref 13.0–17.0)
Immature Granulocytes: 0 %
Lymphocytes Relative: 4 %
Lymphs Abs: 0.4 10*3/uL — ABNORMAL LOW (ref 0.7–4.0)
MCH: 30.1 pg (ref 26.0–34.0)
MCHC: 33.8 g/dL (ref 30.0–36.0)
MCV: 89 fL (ref 80.0–100.0)
Monocytes Absolute: 0.8 10*3/uL (ref 0.1–1.0)
Monocytes Relative: 8 %
Neutro Abs: 8.7 10*3/uL — ABNORMAL HIGH (ref 1.7–7.7)
Neutrophils Relative %: 85 %
Platelet Count: 163 10*3/uL (ref 150–400)
RBC: 3.56 MIL/uL — ABNORMAL LOW (ref 4.22–5.81)
RDW: 15 % (ref 11.5–15.5)
WBC Count: 10.2 10*3/uL (ref 4.0–10.5)
nRBC: 0 % (ref 0.0–0.2)

## 2022-08-11 LAB — CMP (CANCER CENTER ONLY)
ALT: 22 U/L (ref 0–44)
AST: 80 U/L — ABNORMAL HIGH (ref 15–41)
Albumin: 2.5 g/dL — ABNORMAL LOW (ref 3.5–5.0)
Alkaline Phosphatase: 346 U/L — ABNORMAL HIGH (ref 38–126)
Anion gap: 7 (ref 5–15)
BUN: 9 mg/dL (ref 6–20)
CO2: 24 mmol/L (ref 22–32)
Calcium: 8.1 mg/dL — ABNORMAL LOW (ref 8.9–10.3)
Chloride: 103 mmol/L (ref 98–111)
Creatinine: 0.46 mg/dL — ABNORMAL LOW (ref 0.61–1.24)
GFR, Estimated: >60 mL/min (ref >60)
Glucose, Bld: 150 mg/dL — ABNORMAL HIGH (ref 70–99)
Potassium: 3.3 mmol/L — ABNORMAL LOW (ref 3.5–5.1)
Sodium: 134 mmol/L — ABNORMAL LOW (ref 135–145)
Total Bilirubin: 1.3 mg/dL — ABNORMAL HIGH (ref 0.3–1.2)
Total Protein: 6.3 g/dL — ABNORMAL LOW (ref 6.5–8.1)

## 2022-08-11 MED ORDER — HEPARIN SOD (PORK) LOCK FLUSH 100 UNIT/ML IV SOLN
500.0000 [IU] | Freq: Once | INTRAVENOUS | Status: AC
  Administered 2022-08-11: 500 [IU]

## 2022-08-11 MED ORDER — SODIUM CHLORIDE 0.9% FLUSH
10.0000 mL | Freq: Once | INTRAVENOUS | Status: AC
  Administered 2022-08-11: 10 mL

## 2022-08-11 MED ORDER — FENTANYL 12 MCG/HR TD PT72
1.0000 | MEDICATED_PATCH | TRANSDERMAL | 0 refills | Status: AC
  Filled 2022-08-11: qty 10, 30d supply, fill #0

## 2022-08-11 MED ORDER — OXYCODONE HCL 10 MG PO TABS
10.0000 mg | ORAL_TABLET | Freq: Four times a day (QID) | ORAL | 0 refills | Status: AC | PRN
  Filled 2022-08-11: qty 120, 30d supply, fill #0

## 2022-08-11 NOTE — Progress Notes (Unsigned)
[place] [phone]   [phone]  PROGRESS NOTE  Patient Care Team: [name], NP as PCP - General (Nurse Practitioner) Werner Lean, MD as PCP - Cardiology (Cardiology) Armbruster, Carlota Raspberry, MD as Consulting Physician (Gastroenterology) Orson Slick, MD as Consulting Physician (Oncology)  Hematological/Oncological History # Adenocarcinoma of the Pancreas, Stage IIB 02/26/2021: patient seen by GI for jaundice and pancreatic mass causing CBD obstruction. CT abdomen showed pancreatic head mass (4.4 x 3.7 cm) and enlarged portocaval lymph nodes. 02/27/2021: ERCP with placement of a nonmetallic biliary stent. FNA of pancreatic head showed malignant cells consistent with pancreatic adenocarcinoma. 03/05/2021: CT Chest showed no evidence of metastatic disease in the chest. 03/12/2021: establish care with Dr. Lorenso Courier 04/10/2021: Cycle 1 Day 1 of FOLFIRINOX 04/24/2021: Cycle 2 Day 1 of FOLFIRINOX 05/22/2021: Cycle 3 Day 1 of FOLFIRINOX. Delayed x 2 weeks due to hospitalization for poor po intake and dehydration due to gastroparesis. 06/05/2021: Cycle 4 Day 1 of FOLFIRINOX 06/19/2021: Cycle 5 Day 1 of FOLFIRINOX 07/02/2021: Cycle 6 Day 1 of FOLFIRINOX 07/16/2021: Cycle 7 Day 1 of FOLFIRINOX  07/30/2021: Cycle 8 Day 1 of FOLFIRINOX  08/13/2021: Cycle 9 Day 1 of FOLFIRINOX  08/27/2021:  Cycle 10 Day 1 of FOLFIRINOX  09/10/2021: Cycle 11 Day 1 of FOLFIRINOX  09/24/2021: Cycle 12 Day 1 of FOLFIRINOX  10/28/2021: Underwent open whipple with portal vein reconstruction utilizing a R deep FV graft at [facility]. Positive margins noted.  02/18/2022: Start of Chemoradiation with Xeloda 850 mg/m2 on radiation days (5 days per week)  04/09/2022: final day of radiation therapy.  05/15/2022: CT scan at [facility] confirms metastatic disease to the liver.  07/07/2022: CT abdomen pelvis showed new lesions including right adrenal nodule, L4 vertebral  lesion, and right hepatic lesion. 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   Interval History:  [name] [name] 61 y.o. male returns for a follow up visit for pancreatic cancer. He was recently hospitalized from 07/07/2022-07/17/2022 due to abdominal pain, weakness and diarrhea. Unfortunately CT and MRI imaging revealed progressive metastatic disease. Patient was discharged to skilled nursing facility but has returned back home.   [name] [name] reports continued fatigued and lethargy. He is home by himself and ambulates around with a wheelchair. He has a poor appetite with noted weight loss and muscle wasting. He lost close to 10 lbs since 07/19/2022. He reports ongoing nausea that does improve with prescribed antiemetics. He reports worsening lower abdominal pain that he rates as 9 out of 10 on a pain scale. He is taking oxycodone every 6 hours and takes Xtampza occasionally. He denies diarrhea or constipation. He denies easy bruising or signs of active bleeding. He reports worsening shortness of breath with minimal exertion. He denies fevers, chills, sweats, chest pain or cough. He has no other complaints. Rest of the 10 point ROS is be low.   MEDICAL HISTORY:  Past Medical History:  Diagnosis Date   Barrett's esophagus dx 2016   Bronchitis    Chronic hepatitis C without hepatic coma ([place]) 10/31/2014   Depression    Diabetes [facility])    ED (erectile dysfunction)    GERD (gastroesophageal reflux disease)    Hepatitis C    Hiatal hernia 10/2014   3cm   Jaundice 02/26/2021   Neuropathy    pancreatic ca 12/2020   Pneumonia 05/14/2020   S/P transmetatarsal amputation of  foot, left ([place]) 11/28/2020   Sepsis ([place]) 10/28/2020    SURGICAL HISTORY: Past Surgical History:  Procedure Laterality Date   ABDOMINAL AORTOGRAM W/LOWER EXTREMITY N/A 06/24/2022   Procedure: ABDOMINAL AORTOGRAM W/LOWER EXTREMITY;  Surgeon: Broadus John, MD;  Location: [facility];  Service: Cardiovascular;  Laterality: N/A;   AMPUTATION Left 11/15/2020   Procedure: LEFT TRANSMETATARSALS AMPUTATION;  Surgeon: Newt Minion, MD;  Location: [place];  Service: Orthopedics;  Laterality: Left;   APPENDECTOMY     BILIARY STENT PLACEMENT N/A 02/27/2021   Procedure: BILIARY STENT PLACEMENT;  Surgeon: Milus Banister, MD;  Location: [facility];  Service: Endoscopy;  Laterality: N/A;   BILIARY STENT PLACEMENT N/A 03/27/2021   Procedure: BILIARY STENT PLACEMENT;  Surgeon: Jackquline Denmark, MD;  Location: [facility];  Service: Endoscopy;  Laterality: N/A;   BIOPSY  03/27/2021   Procedure: BIOPSY;  Surgeon: Jackquline Denmark, MD;  [facility]: [facility];  Service: Endoscopy;;   COLONOSCOPY N/A 02/16/2014   Procedure: COLONOSCOPY;  Surgeon: Gatha Mayer, MD;  [facility]: [facility];  Service: Endoscopy;  Laterality: N/A;   COLONOSCOPY     ENDOSCOPIC RETROGRADE CHOLANGIOPANCREATOGRAPHY (ERCP) WITH PROPOFOL N/A 02/27/2021   Procedure: ENDOSCOPIC RETROGRADE CHOLANGIOPANCREATOGRAPHY (ERCP) WITH PROPOFOL;  Surgeon: Milus Banister, MD;  [facility];  Service: Endoscopy;  Laterality: N/A;   ERCP N/A 03/27/2021   Procedure: ENDOSCOPIC RETROGRADE CHOLANGIOPANCREATOGRAPHY (ERCP);  Surgeon: Jackquline Denmark, MD;  Location: [facility];  Service: Endoscopy;  Laterality: N/A;   ESOPHAGOGASTRODUODENOSCOPY (EGD) WITH PROPOFOL N/A 05/06/2021   Procedure: ESOPHAGOGASTRODUODENOSCOPY (EGD) WITH PROPOFOL;  Surgeon: Mauri Pole, MD;  [facility];  Service: Endoscopy;  Laterality: N/A;   EUS N/A 02/27/2021   Procedure: UPPER ENDOSCOPIC ULTRASOUND (EUS) RADIAL;  Surgeon: Milus Banister, MD;  [facility];  Service: Endoscopy;  Laterality: N/A;   FINE NEEDLE ASPIRATION N/A 02/27/2021   Procedure: FINE NEEDLE ASPIRATION (FNA) LINEAR;  Surgeon: Milus Banister, MD;  [facility];  Service: Endoscopy;  Laterality: N/A;   IR IMAGING  GUIDED PORT INSERTION  03/21/2021   SPHINCTEROTOMY  02/27/2021   Procedure: SPHINCTEROTOMY;  Surgeon: Milus Banister, MD;  [facility];  Service: Endoscopy;;   STENT REMOVAL  03/27/2021   Procedure: STENT REMOVAL;  Surgeon: Jackquline Denmark, MD;  [facility];  Service: Endoscopy;;   TONSILLECTOMY      SOCIAL HISTORY: Social History   Socioeconomic History   Marital status: Single    Spouse name: Not on file   Number of children: Not on file   Years of education: Not on file   Highest education level: Not on file  Occupational History   Not on file  Tobacco Use   Smoking status: Every Day    Packs/day: 0.50    Years: 35.00    Total pack years: 17.50    Types: Cigarettes   Smokeless tobacco: Never   Tobacco comments:    Currently being followed by the smoking cessation program   Vaping Use   Vaping Use: Never used  Substance and Sexual Activity   Alcohol use: Not Currently    Comment: previous   Drug use: No   Sexual activity: Not on file  Other Topics Concern   Not on file  Social History Narrative   Not on file   Social Determinants of Health   Financial Resource Strain: High Risk (03/13/2021)   Overall Financial Resource Strain (CARDIA)    Difficulty of Paying Living Expenses:  Very hard  Food Insecurity: Food Insecurity Present (05/15/2021)   Hunger Vital Sign    Worried About Running Out of Food in the Last Year: Sometimes true    Ran Out of Food in the Last Year: Sometimes true  Transportation Needs: Unmet Transportation Needs (03/13/2021)   PRAPARE - Hydrologist (Medical): Yes    Lack of Transportation (Non-Medical): Yes  Physical Activity: Not on file  Stress: Not on file  Social Connections: Not on file  Intimate Partner Violence: Not At Risk (12/02/2021)   Humiliation, Afraid, Rape, and Kick questionnaire    Fear of Current or Ex-Partner: No    Emotionally Abused: No    Physically Abused: No    Sexually  Abused: No    FAMILY HISTORY: Family History  Problem Relation Age of Onset   Breast cancer Mother    Stroke Father    Alcohol abuse Father    Heart disease Maternal Grandfather    Pancreatic cancer Paternal Grandmother    Diabetes Paternal Grandfather    Colon cancer Neg Hx    Stomach cancer Neg Hx    Rectal cancer Neg Hx    Esophageal cancer Neg Hx     ALLERGIES:  is allergic to bee venom and lactose intolerance (gi).  MEDICATIONS:  Current Outpatient Medications  Medication Sig Dispense Refill   acamprosate (CAMPRAL) 333 MG tablet Take 2 tablets (666 mg total) by mouth 3 (three) times daily with meals.     acetaminophen (TYLENOL) 500 MG tablet Take 1 tablet by mouth every 6 hours as needed for moderate pain. (Patient taking differently: Take 1,500 mg by mouth 2 (two) times daily as needed for moderate pain.) 60 tablet 3   albuterol (VENTOLIN HFA) 108 (90 Base) MCG/ACT inhaler Inhale 2 puffs into the lungs every 6 hours as needed for wheezing or shortness of breath. 18 g 0   alum & mag hydroxide-simeth (MAALOX/MYLANTA) 200-200-20 MG/5ML suspension Take 30 mLs by mouth every 4 (four) hours as needed for indigestion. 355 mL 0   apixaban (ELIQUIS) 5 MG TABS tablet Take 1 tablet (5 mg total) by mouth 2 (two) times daily. To be taken beginning 07/19/2022 60 tablet    aspirin EC 81 MG tablet Take 1 tablet by mouth daily. 100 tablet 0   benzonatate (TESSALON) 100 MG capsule Take 1 capsule (100 mg total) by mouth 3 (three) times daily as needed for cough. 20 capsule 0   bisacodyl (DULCOLAX) 5 MG EC tablet Take 1 tablet (5 mg total) by mouth daily as needed for moderate constipation. 30 tablet 0   diphenoxylate-atropine (LOMOTIL) 2.5-0.025 MG tablet Take 1 tablet by mouth 4 times daily as needed for diarrhea or loose stools. 60 tablet 1   EPINEPHrine 0.3 mg/0.3 mL IJ SOAJ injection Inject 0.3 mg into the muscle once as needed for anaphylaxis. 2 each 1   escitalopram (LEXAPRO) 10 MG tablet  Take 1 tablet (10 mg total) by mouth daily. 90 tablet 1   feeding supplement (ENSURE ENLIVE / ENSURE PLUS) LIQD Take 237 mLs by mouth 3 (three) times daily between meals. 408 mL 12   folic acid (FOLVITE) 1 MG tablet Take 1 tablet (1 mg total) by mouth in the morning.     insulin aspart (NOVOLOG) 100 UNIT/ML injection Inject 0-6 Units into the skin 3 (three) times daily with meals. 10 mL 11   insulin aspart (NOVOLOG) 100 UNIT/ML injection Inject 0-5 Units into the skin at bedtime.  10 mL 11   insulin glargine-yfgn (SEMGLEE) 100 UNIT/ML injection Inject 0.1 mLs (10 Units total) into the skin at bedtime. 10 mL 11   lipase/protease/amylase (CREON) 36000 UNITS CPEP capsule Take 1 capsule (36,000 Units total) by mouth 3 (three) times daily with meals. May take 1 additional capsule with snacks (Patient taking differently: Take 72,000 Units by mouth 3 (three) times daily with meals.) 270 capsule 3   mirtazapine (REMERON) 30 MG tablet Take 1 tablet (30 mg total) by mouth at bedtime. 90 tablet 1   Multiple Vitamins-Minerals (THEREMS-M) TABS Take 1 tablet by mouth in the morning. 90 tablet 0   Nutritional Supplements (,FEEDING SUPPLEMENT, PROSOURCE PLUS) liquid Take 30 mLs by mouth 2 (two) times daily between meals.     ondansetron (ZOFRAN) 8 MG tablet Take 1 tablet (8 mg total) by mouth 2 (two) times daily as needed. Start on day 3 after chemotherapy. 30 tablet 1   pantoprazole (PROTONIX) 40 MG tablet Take 1 tablet by mouth daily. 90 tablet 1   polyethylene glycol (MIRALAX / GLYCOLAX) 17 g packet Take 17 g by mouth daily as needed for mild constipation. 14 each 0   pregabalin (LYRICA) 75 MG capsule Take 1 capsule (75 mg total) by mouth daily. 30 capsule 1   rosuvastatin (CRESTOR) 10 MG tablet Take 1 tablet (10 mg total) by mouth daily.     thiamine (VITAMIN B-1) 100 MG tablet Take 1 tablet (100 mg total) by mouth daily.     zolpidem (AMBIEN) 10 MG tablet Take 1 tablet (10 mg total) by mouth at bedtime as needed  for sleep. 30 tablet 1   apixaban (ELIQUIS) 5 MG TABS tablet Take 2 tablets (10 mg total) by mouth 2 (two) times daily for 3 doses. 6 tablet 0   fentaNYL (DURAGESIC) 12 MCG/HR Place 1 patch onto the skin every 3 (three) days. 10 patch 0   Oxycodone HCl 10 MG TABS Take 1 tablet (10 mg total) by mouth every 6 (six) hours as needed. 120 tablet 0   No current facility-administered medications for this visit.    REVIEW OF SYSTEMS:   Constitutional: ( - ) fevers, ( - )  chills , ( - ) night sweats Eyes: ( - ) blurriness of vision, ( - ) double vision, ( - ) watery eyes Ears, nose, mouth, throat, and face: ( - ) mucositis, ( - ) sore throat Respiratory: ( - ) cough, ( +) dyspnea, ( - ) wheezes Cardiovascular: ( - ) palpitation, ( - ) chest discomfort, ( - ) lower extremity swelling Gastrointestinal:  ( +) nausea, ( - ) heartburn, ( - ) change in bowel habits Skin: ( - ) abnormal skin rashes Lymphatics: ( - ) new lymphadenopathy, ( - ) easy bruising Neurological: ( - ) numbness, ( - ) tingling, ( - ) new weaknesses Behavioral/Psych: ( - ) mood change, ( - ) new changes  All other systems were reviewed with the patient and are negative.  PHYSICAL EXAMINATION: ECOG PERFORMANCE STATUS: 3 - Symptomatic, >50% confined to bed  Vitals:   08/11/22 1039  BP: (!) 146/95  Pulse: (!) 125  Resp: 17  Temp: (!) 97.5 F (36.4 C)  SpO2: 98%   Filed Weights   08/11/22 1039  Weight: 125 lb 4.8 oz (56.8 kg)    GENERAL: Chronically ill appearing. Cachectic and muscle wasting noted.  SKIN: skin color, texture, turgor are normal, no rashes or significant lesions.  EYES: conjunctiva are pink  and non-injected, sclera clear LUNGS: clear to auscultation and percussion with normal breathing effort HEART: regular rate & rhythm and no murmurs and no lower extremity edema  PSYCH: alert & oriented x 3, fluent speech NEURO: no focal motor/sensory deficits  LABORATORY DATA:  I have reviewed the data as  listed    Latest Ref Rng & Units 08/11/2022    9:54 AM 07/15/2022   10:35 PM 07/15/2022    4:38 AM  CBC  WBC 4.0 - 10.5 K/uL 10.2  8.8  7.6   Hemoglobin 13.0 - 17.0 g/dL 10.7  8.0  7.6   Hematocrit 39.0 - 52.0 % 31.7  24.9  23.5   Platelets 150 - 400 K/uL 163  153  120        Latest Ref Rng & Units 08/11/2022    9:54 AM 07/15/2022   10:35 PM 07/15/2022    4:38 AM  CMP  Glucose 70 - 99 mg/dL 150  164  189   BUN 6 - 20 mg/dL '9  13  12   '$ Creatinine 0.61 - 1.24 mg/dL 0.46  0.45  0.41   Sodium 135 - 145 mmol/L 134  133  136   Potassium 3.5 - 5.1 mmol/L 3.3  3.4  3.8   Chloride 98 - 111 mmol/L 103  105  105   CO2 22 - 32 mmol/L '24  26  26   '$ Calcium 8.9 - 10.3 mg/dL 8.1  7.4  7.3   Total Protein 6.5 - 8.1 g/dL 6.3  5.7    Total Bilirubin 0.3 - 1.2 mg/dL 1.3  1.1    Alkaline Phos 38 - 126 U/L 346  182    AST 15 - 41 U/L 80  40    ALT 0 - 44 U/L 22  17     PATHOLOGY:  Procedure:    Pancreaticoduodenectomy (Whipple resection), partial pancreatectomy   TUMOR     Tumor Site:    Pancreatic head     Histologic Type:    Ductal adenocarcinoma (NOS)     Histologic Grade:    G2, moderately differentiated     Tumor Size:    Greatest Dimension (Centimeters): 3.7 cm       Additional Dimension (Centimeters):    2.6 cm       Additional Dimension (Centimeters):    2.1 cm     Site(s) Involved by Direct Tumor Extension:    Ampulla of Vater or sphincter of Oddi     Site(s) Involved by Direct Tumor Extension:    Duodenal wall     Site(s) Involved by Direct Tumor Extension:    Superior mesenteric vein     Treatment Effect:    Absent, with extensive residual cancer and no evident tumor regression (poor or no response, score 3)     Lymphovascular Invasion:    Present     Perineural Invasion:    Present   MARGINS     Margin Status for Invasive Carcinoma:    Invasive carcinoma present at margin       Margin(s) Involved by Invasive Carcinoma:    Pancreatic neck / parenchymal       Margin(s)  Involved by Invasive Carcinoma:    Uncinate (retroperitoneal / superior mesenteric artery)     Margin Status for Dysplasia and Intraepithelial Neoplasia:    All margins negative for dysplasia and intraepithelial neoplasia   REGIONAL LYMPH NODES     Regional Lymph Node Status:           :  All regional lymph nodes negative for tumor       Number of Lymph Nodes Examined:    36   PATHOLOGIC STAGE CLASSIFICATION  (pTNM, AJCC 8th Edition)     Reporting of pT, pN, and (when applicable) pM categories is based on information available to the pathologist at the time the report is issued. As per the AJCC (Chapter 1, 8th Ed.) it is the managing physician's responsibility to establish the final pathologic stage based upon all pertinent information, including but potentially not limited to this pathology report.     TNM Descriptors:    y (post-treatment)     pT Category:    pT2     pN Category:    pN0   RADIOGRAPHIC STUDIES: I have personally reviewed the radiological images as listed and agreed with the findings in the report: Borderline resectable pancreatic mass with no evidence of metastatic disease. DG Chest 1 View  Result Date: 07/15/2022 CLINICAL DATA:  Pneumonia EXAM: CHEST  1 VIEW COMPARISON:  07/10/2022 FINDINGS: Single frontal view of the chest demonstrates right chest wall port unchanged. The cardiac silhouette is unremarkable. Lung volumes are diminished, with improved aeration of the lung bases since prior study. Minimal residual basilar atelectasis. Small right pleural effusion has developed in the interim. No pneumothorax. No acute bony abnormalities. IMPRESSION: 1. Small right pleural effusion. 2. Low lung volumes, with persistent but improving bibasilar atelectasis. Electronically Signed   By: Randa Ngo M.D.   On: 07/15/2022 19:59    ASSESSMENT & PLAN ANNETTE LIOTTA 61 y.o. male who returns for a follow up for metastatic pancreatic cancer   # Adenocarcinoma of the Pancreas, Stage  IIB -- At this time disease appears borderline resectable.  We will proceed with neoadjuvant chemotherapy.  At this time would prefer a regimen of FOLFIRINOX given his good baseline health. -- case reviewed by surgery, who agrees his disease is borderline resectable.  -- baseline elevations in both CA 19-9 and CEA. Continue to monitor during treatment.  -- Cycle 1 Day 1 of FOLFIRINOX started 04/10/2021. Cycle 12 Day 1 on 09/24/2021, completed neoadjuvant chemotherapy. --Underwent open whipple with portal vein reconstruction utilizing a R deep FV graft on 10/28/2021 at [facility]. Noted to have positive margins, adjuvant chemoradiation was recommended. --completed chemoradiation with Xeloda on 04/09/2022 --CT scan on 05/15/2022 at [facility] showed metastatic disease to the liver.  --Admitted 07/07/2022-07/17/2022 with repeat CT/MRI imaging that shows progressive metastatic disease.  --Due to poor performance status he is a poor candidate for systemic therapy --Recommend hospice care. Patient will follow up with palliative care later today to discuss initiating home hospice care and then transferring to hospice facility.  --RTC in 4 weeks for a follow up with Dr. Lorenso Courier.   # Pain Control --continue oxycodone 10 mg every 6 hours and Xtampza 9 mg q 12H  --appreciate palliative care recommendations for better pain control  #Wound care -- During recent hospitalization he was found to have multiple wounds including an unstageable right medial heel wound as well as a additional pressure injury to the left lateral malleolus. Patient was evaluated by wound care and wound care was initiated as well as the initiation of Prevalon boots for offloading.   No orders of the defined types were placed in this encounter.  All questions were answered. The patient knows to call the clinic with any problems, questions or concerns.  I have spent a total of 30 minutes minutes of face-to-face and non-face-to-face  time, preparing to see the patient,  performing a medically appropriate examination, counseling and educating the patient, ordering tests/procedures, documenting clinical information in the electronic health record and care coordination.   Dede Query PA-C Dept of Hematology and [place] at [facility] Phone: [phone]

## 2022-08-11 NOTE — Progress Notes (Signed)
[place]  [phone]) [phone] [phone]   Name: [name] Date: 08/11/2022 MRN: [id number]  DOB: 11/06/1960  Patient Care Team: [name], NP as PCP - General (Nurse Practitioner) Werner Lean, MD as PCP - Cardiology (Cardiology) Armbruster, Carlota Raspberry, MD as Consulting Physician (Gastroenterology) Orson Slick, MD as Consulting Physician (Oncology)    REASON FOR CONSULTATION: [name] [name] is a 61 y.o. male with oncologic medical history including pancreatic adenocarcinoma s/p resection with recurrence of liver involvement.  Palliative ask to see for symptom management and goals of care.    SOCIAL HISTORY:     reports that he has been smoking cigarettes. He has a 17.50 pack-year smoking history. He has never used smokeless tobacco. He reports that he does not currently use alcohol. He reports that he does not use drugs.  ADVANCE DIRECTIVES:  [name] [name] has a documented advanced directive on file naming his Sister Ophelia Shoulder and Dimas Millin.   CODE STATUS: DNR  PAST MEDICAL HISTORY: Past Medical History:  Diagnosis Date   Barrett's esophagus dx 2016   Bronchitis    Chronic hepatitis C without hepatic coma ([place]) 10/31/2014   Depression    Diabetes ([place])    ED (erectile dysfunction)    GERD (gastroesophageal reflux disease)    Hepatitis C    Hiatal hernia 10/2014   3cm   Jaundice 02/26/2021   Neuropathy    pancreatic ca 12/2020   Pneumonia 05/14/2020   S/P transmetatarsal amputation of foot, left ([place]) 11/28/2020   Sepsis ([place]) 10/28/2020    PAST SURGICAL HISTORY:  Past Surgical History:  Procedure Laterality Date   ABDOMINAL AORTOGRAM W/LOWER EXTREMITY N/A 06/24/2022   Procedure: ABDOMINAL AORTOGRAM W/LOWER EXTREMITY;  Surgeon: Broadus John, MD;  Location: [facility];  Service: Cardiovascular;  Laterality: N/A;   AMPUTATION Left 11/15/2020   Procedure: LEFT  TRANSMETATARSALS AMPUTATION;  Surgeon: Newt Minion, MD;  Location: [facility];  Service: Orthopedics;  Laterality: Left;   APPENDECTOMY     BILIARY STENT PLACEMENT N/A 02/27/2021   Procedure: BILIARY STENT PLACEMENT;  Surgeon: Milus Banister, MD;  Location: [facility];  Service: Endoscopy;  Laterality: N/A;   BILIARY STENT PLACEMENT N/A 03/27/2021   Procedure: BILIARY STENT PLACEMENT;  Surgeon: Jackquline Denmark, MD;  Location: [facility];  Service: Endoscopy;  Laterality: N/A;   BIOPSY  03/27/2021   Procedure: BIOPSY;  Surgeon: Jackquline Denmark, MD;  [facility]: [facility];  Service: Endoscopy;;   COLONOSCOPY N/A 02/16/2014   Procedure: COLONOSCOPY;  Surgeon: Gatha Mayer, MD;  [facility];  Service: Endoscopy;  Laterality: N/A;   COLONOSCOPY     ENDOSCOPIC RETROGRADE CHOLANGIOPANCREATOGRAPHY (ERCP) WITH PROPOFOL N/A 02/27/2021   Procedure: ENDOSCOPIC RETROGRADE CHOLANGIOPANCREATOGRAPHY (ERCP) WITH PROPOFOL;  Surgeon: Milus Banister, MD;  [facility];  Service: Endoscopy;  Laterality: N/A;   ERCP N/A 03/27/2021   Procedure: ENDOSCOPIC RETROGRADE CHOLANGIOPANCREATOGRAPHY (ERCP);  Surgeon: Jackquline Denmark, MD;  Location: [facility];  Service: Endoscopy;  Laterality: N/A;   ESOPHAGOGASTRODUODENOSCOPY (EGD) WITH PROPOFOL N/A 05/06/2021   Procedure: ESOPHAGOGASTRODUODENOSCOPY (EGD) WITH PROPOFOL;  Surgeon: Mauri Pole, MD;  [facility];  Service: Endoscopy;  Laterality: N/A;   EUS N/A 02/27/2021   Procedure: UPPER ENDOSCOPIC ULTRASOUND (EUS) RADIAL;  Surgeon: Milus Banister, MD;  [facility]: [facility];  Service: Endoscopy;  Laterality: N/A;   FINE NEEDLE ASPIRATION N/A 02/27/2021   Procedure: FINE NEEDLE ASPIRATION (FNA)  LINEAR;  Surgeon: Milus Banister, MD;  Location: [facility];  Service: Endoscopy;  Laterality: N/A;   IR IMAGING GUIDED PORT INSERTION  03/21/2021   SPHINCTEROTOMY  02/27/2021   Procedure: SPHINCTEROTOMY;  Surgeon: Milus Banister, MD;   [facility];  Service: Endoscopy;;   STENT REMOVAL  03/27/2021   Procedure: STENT REMOVAL;  Surgeon: Jackquline Denmark, MD;  [facility];  Service: Endoscopy;;   TONSILLECTOMY      HEMATOLOGY/ONCOLOGY HISTORY:  Oncology History  Pancreatic cancer ([place])  03/12/2021 Initial Diagnosis   Pancreatic cancer ([place])   03/12/2021 Cancer Staging   Staging form: Exocrine Pancreas, AJCC 8th Edition - Clinical stage from 03/12/2021: Stage IIB (cT3, cN1, cM0) - Signed by Orson Slick, MD on 03/12/2021 Stage prefix: Initial diagnosis   Malignant neoplasm of head of pancreas ([place])  03/12/2021 Initial Diagnosis   Malignant neoplasm of head of pancreas ([place])   04/10/2021 - 09/26/2021 Chemotherapy   Patient is on Treatment Plan : PANCREAS Modified FOLFIRINOX q14d x 4 cycles       ALLERGIES:  is allergic to bee venom and lactose intolerance (gi).  MEDICATIONS:  Current Outpatient Medications  Medication Sig Dispense Refill   acamprosate (CAMPRAL) 333 MG tablet Take 2 tablets (666 mg total) by mouth 3 (three) times daily with meals.     acetaminophen (TYLENOL) 500 MG tablet Take 1 tablet by mouth every 6 hours as needed for moderate pain. (Patient taking differently: Take 1,500 mg by mouth 2 (two) times daily as needed for moderate pain.) 60 tablet 3   albuterol (VENTOLIN HFA) 108 (90 Base) MCG/ACT inhaler Inhale 2 puffs into the lungs every 6 hours as needed for wheezing or shortness of breath. 18 g 0   alum & mag hydroxide-simeth (MAALOX/MYLANTA) 200-200-20 MG/5ML suspension Take 30 mLs by mouth every 4 (four) hours as needed for indigestion. 355 mL 0   apixaban (ELIQUIS) 5 MG TABS tablet Take 2 tablets (10 mg total) by mouth 2 (two) times daily for 3 doses. 6 tablet 0   apixaban (ELIQUIS) 5 MG TABS tablet Take 1 tablet (5 mg total) by mouth 2 (two) times daily. To be taken beginning 07/19/2022 60 tablet    aspirin EC 81 MG tablet Take 1 tablet by mouth daily. 100 tablet 0    benzonatate (TESSALON) 100 MG capsule Take 1 capsule (100 mg total) by mouth 3 (three) times daily as needed for cough. 20 capsule 0   bisacodyl (DULCOLAX) 5 MG EC tablet Take 1 tablet (5 mg total) by mouth daily as needed for moderate constipation. 30 tablet 0   diphenoxylate-atropine (LOMOTIL) 2.5-0.025 MG tablet Take 1 tablet by mouth 4 times daily as needed for diarrhea or loose stools. 60 tablet 1   EPINEPHrine 0.3 mg/0.3 mL IJ SOAJ injection Inject 0.3 mg into the muscle once as needed for anaphylaxis. 2 each 1   escitalopram (LEXAPRO) 10 MG tablet Take 1 tablet (10 mg total) by mouth daily. 90 tablet 1   feeding supplement (ENSURE ENLIVE / ENSURE PLUS) LIQD Take 237 mLs by mouth 3 (three) times daily between meals. 818 mL 12   folic acid (FOLVITE) 1 MG tablet Take 1 tablet (1 mg total) by mouth in the morning.     insulin aspart (NOVOLOG) 100 UNIT/ML injection Inject 0-6 Units into the skin 3 (three) times daily with meals. 10 mL 11   insulin aspart (NOVOLOG) 100 UNIT/ML injection Inject 0-5 Units into the skin at bedtime. 10  mL 11   insulin glargine-yfgn (SEMGLEE) 100 UNIT/ML injection Inject 0.1 mLs (10 Units total) into the skin at bedtime. 10 mL 11   lipase/protease/amylase (CREON) 36000 UNITS CPEP capsule Take 1 capsule (36,000 Units total) by mouth 3 (three) times daily with meals. May take 1 additional capsule with snacks (Patient taking differently: Take 72,000 Units by mouth 3 (three) times daily with meals.) 270 capsule 3   mirtazapine (REMERON) 30 MG tablet Take 1 tablet (30 mg total) by mouth at bedtime. 90 tablet 1   Multiple Vitamins-Minerals (THEREMS-M) TABS Take 1 tablet by mouth in the morning. 90 tablet 0   Nutritional Supplements (,FEEDING SUPPLEMENT, PROSOURCE PLUS) liquid Take 30 mLs by mouth 2 (two) times daily between meals.     ondansetron (ZOFRAN) 8 MG tablet Take 1 tablet (8 mg total) by mouth 2 (two) times daily as needed. Start on day 3 after chemotherapy. 30 tablet 1    Oxycodone HCl 10 MG TABS Take 1 tablet (10 mg total) by mouth every 6 (six) hours as needed. 120 tablet 0   pantoprazole (PROTONIX) 40 MG tablet Take 1 tablet by mouth daily. 90 tablet 1   polyethylene glycol (MIRALAX / GLYCOLAX) 17 g packet Take 17 g by mouth daily as needed for mild constipation. 14 each 0   pregabalin (LYRICA) 75 MG capsule Take 1 capsule (75 mg total) by mouth daily. 30 capsule 1   rosuvastatin (CRESTOR) 10 MG tablet Take 1 tablet (10 mg total) by mouth daily.     thiamine (VITAMIN B-1) 100 MG tablet Take 1 tablet (100 mg total) by mouth daily.     zolpidem (AMBIEN) 10 MG tablet Take 1 tablet (10 mg total) by mouth at bedtime as needed for sleep. 30 tablet 1   No current facility-administered medications for this visit.    VITAL SIGNS: There were no vitals taken for this visit. There were no vitals filed for this visit.  Estimated body mass index is 17.48 kg/m as calculated from the following:   Height as of 07/07/22: _0  (1.803 m).   Weight as of an earlier encounter on 08/11/22: 125 lb 4.8 oz (56.8 kg).  LABS: CBC:    Component Value Date/Time   WBC 10.2 08/11/2022 0954   WBC 8.8 07/15/2022 2235   HGB 10.7 (L) 08/11/2022 0954   HGB 14.1 09/23/2020 1037   HCT 31.7 (L) 08/11/2022 0954   HCT 43.6 09/23/2020 1037   PLT 163 08/11/2022 0954   PLT 251 09/23/2020 1037   MCV 89.0 08/11/2022 0954   MCV 91 09/23/2020 1037   NEUTROABS 8.7 (H) 08/11/2022 0954   LYMPHSABS 0.4 (L) 08/11/2022 0954   MONOABS 0.8 08/11/2022 0954   EOSABS 0.2 08/11/2022 0954   BASOSABS 0.1 08/11/2022 0954   Comprehensive Metabolic Panel:    Component Value Date/Time   NA 134 (L) 08/11/2022 0954   NA 129 (L) 09/23/2020 1037   K 3.3 (L) 08/11/2022 0954   CL 103 08/11/2022 0954   CO2 24 08/11/2022 0954   BUN 9 08/11/2022 0954   BUN 9 09/23/2020 1037   CREATININE 0.46 (L) 08/11/2022 0954   CREATININE 0.71 12/27/2020 0918   GLUCOSE 150 (H) 08/11/2022 0954   CALCIUM 8.1 (L)  08/11/2022 0954   AST 80 (H) 08/11/2022 0954   ALT 22 08/11/2022 0954   ALKPHOS 346 (H) 08/11/2022 0954   BILITOT 1.3 (H) 08/11/2022 0954   PROT 6.3 (L) 08/11/2022 0954   PROT 7.3 09/23/2020 1037  ALBUMIN 2.5 (L) 08/11/2022 0954   ALBUMIN 4.3 09/23/2020 1037    RADIOGRAPHIC STUDIES: DG Chest 1 View  Result Date: 07/15/2022 CLINICAL DATA:  Pneumonia EXAM: CHEST  1 VIEW COMPARISON:  07/10/2022 FINDINGS: Single frontal view of the chest demonstrates right chest wall port unchanged. The cardiac silhouette is unremarkable. Lung volumes are diminished, with improved aeration of the lung bases since prior study. Minimal residual basilar atelectasis. Small right pleural effusion has developed in the interim. No pneumothorax. No acute bony abnormalities. IMPRESSION: 1. Small right pleural effusion. 2. Low lung volumes, with persistent but improving bibasilar atelectasis. Electronically Signed   By: Randa Ngo M.D.   On: 07/15/2022 19:59    PERFORMANCE STATUS (ECOG) : 2 - Symptomatic, <50% confined to bed  Review of Systems  Constitutional:  Positive for activity change, appetite change, fatigue and unexpected weight change.  Respiratory:  Positive for shortness of breath.   Musculoskeletal:  Positive for arthralgias and back pain.  Neurological:  Positive for weakness.  Unless otherwise noted, a complete review of systems is negative.  Physical Exam General: NAD, weak, ill-appearing, cachectic Cardiovascular: regular rate and rhythm Pulmonary: diminished  Abdomen: soft, nontender, + bowel sounds Extremities: no edema, left foot amputation Skin: no rashes, temporal wasting,  Neurological:AAO x3  IMPRESSION: This is my initial visit with [name] [name] here at the cancer center. He was seen by our palliative team during recent admission. Of note he was also being followed by in-home palliative via [facility]. Patient is wheelchair bound.  Cachectic.  Temporal wasting.  Alert and able to  engage appropriately in discussions.  I introduced myself, [place] RN, and Palliative's role in collaboration with the oncology team. Concept of Palliative Care was introduced as specialized medical care for people and their families living with serious illness.  It focuses on providing relief from the symptoms and stress of a serious illness.  The goal is to improve quality of life for both the patient and the family. Values and goals of care important to patient and family were attempted to be elicited.   [name] [name] lives at home by himself.  He does not have any children.  He does have siblings who unfortunately lives out of state.  He is closest with his Sister Jeral Fruit who lives in [place].  He has a friend that assists him with errands and medical appointments.  He is unable to ambulate independently due to weakness and gait instability.  Reports maneuvering around his home in a wheelchair.  Denies constipation or diarrhea.  Endorses ongoing pain which has increased despite current pain regimen.  Little to no appetite.  He states he has not eaten in several days and has no desire to eat.  Current weight 125 pounds.  This is down from 135 pounds on 11/19, and 141 pounds 11/14.   We discussed he is current illness and what it means in the larger context of he is on-going co-morbidities.  We also discussed ongoing pain.  Natural disease trajectory and expectations were discussed.  [name] [name] complains of ongoing generalized pain.  Reports the bulk of his pain is in his back, legs, left upper and lower quadrant abdomen.  We discussed at length his current pain regimen.  He is taking oxycodone as needed every 6 hours.  He was initially prescribed Xtampza however he is not taking stating he often forgets.  Education provided on pain management and gaining better control.  Given his inability to remember to take long-acting medications as  well as other medications advised to only focus on those medications as  needed.  We will start him on fentanyl patch.  Education has been provided in the convenience of changing every 3 days.  [name] [name] is realistic in his understanding of current disease progression.  He expresses goals are to focus solely on his comfort with aggressive symptom management and relieving any suffering is much as possible.  I expressed concerns of him remaining in the home alone however his wishes is to remain at home as long as possible.  He states he needs to get some affairs in order.  I inquired about specific affairs such as legal documentations however patient states this is all been taking care of and he does not on his home he is actually renting.  He is in reference to cleaning out certain items in his home.  Advised at this point he does not have the strength to be doing this however may be friends will assist.  I discussed at length patient's current CODE STATUS and consideration of his current illness and comorbidities.  He expressed wishes for no heroic measures and desires for natural death.  Education provided on DNR/DNI.  Patient again confirms wishes for DNR.  Out of facility form completed and given to patient.  He is aware this should be posted in his home and visible to all caregivers.   Education provided on MOLST form.  Patient verbalized understanding with wishes to complete.  [name] [name] outlined their wishes for the following treatment decisions:  Cardiopulmonary Resuscitation: Do Not Attempt Resuscitation (DNR/No CPR)  Medical Interventions: Comfort Measures: Keep clean, warm, and dry. Use medication by any route, positioning, wound care, and other measures to relieve pain and suffering. Use oxygen, suction and manual treatment of airway obstruction as needed for comfort. Do not transfer to the hospital unless comfort needs cannot be met in current location.  Antibiotics: Determine use of limitation of antibiotics when infection occurs  IV Fluids: No IV fluids  (provide other measures to ensure comfort)  Feeding Tube: No feeding tube    Education provided on outpatient hospice (in-home versus inpatient).  We discussed as his health rapidly declines he may be more of a candidate for inpatient hospice.  He verbalized understanding but again expresses wishes to return home initially if at all possible with hospice support.  Confirms wishes to remain under the care of of [facility].  Education provided on referral process with understanding that his assigned staff will be giving him a call to proceed with admission under hospice services.  He verbalized understanding and appreciation.  I discussed the importance of continued conversation with family and their medical providers regarding overall plan of care and treatment options, ensuring decisions are within the context of the patients values and GOCs.  PLAN: Established therapeutic relationship. Education provided on palliative's role in collaboration with their Oncology/Radiation team. Fentanyl 12.5 mcg patch Oxycodone 10 mg every 4-6 hours as needed for breakthrough pain MiraLAX daily Home oxygen for comfort support Minimize medications Extensive goals of care discussions.  Patient is clear in his expressed wishes to focus solely on his comfort allow him to spend what time he has left comfortably.  His wishes are to return home with outpatient hospice support with understanding it may be in his best interest to transfer to residential hospice for more aggressive symptom management in the upcoming days.  He is realistic in his cancer progression and poor prognosis of weeks to months. DNR/DNI (patient  provided with original copy) MOLST form completed (see above.  Patient provided with original copy) Outpatient hospice referral to [facility]. Patient aware we will not schedule a follow-up as hospice will provide in-home care and symptom management as needed.  He understands that the team here is available  if needed.   Patient expressed understanding and was in agreement with this plan. He also understands that He can call the clinic at any time with any questions, concerns, or complaints.   Thank you for your referral and allowing Palliative to assist in [name] [name] [name] [name]'s care.   Number and complexity of problems addressed: HIGH - 1 or more chronic illnesses with SEVERE exacerbation, progression, or side effects of treatment - advanced cancer, pain. Any controlled substances utilized were prescribed in the context of palliative care.  Time Total: 50 min   Visit consisted of counseling and education dealing with the complex and emotionally intense issues of symptom management and palliative care in the setting of serious and potentially life-threatening illness.Greater than 50%  of this time was spent counseling and coordinating care related to the above assessment and plan.  Signed by: Alda Lea, AGPCNP-BC Palliative Medicine Team/[place]

## 2022-08-11 NOTE — Progress Notes (Unsigned)
Cardiology Office Note:    Date:  08/12/2022   ID:  [name] [name], DOB 08-Jan-1961, MRN [id number]  PCP:  [name], NP   [facility] Cardiologist:  Werner Lean, MD     Referring MD: [name], NP   CC: Pericardial Effusion   History of Present Illness:    [name] [name] is a 61 y.o. male with a hx of  tobacco abuse, Barrett's esophagus, cirrhosis due to hepatitis C and history of alcohol use, cured hep C, stage IIB pancreatic adenocarcinoma s/p chemotherapy and  open whipple with portal vein reconstruction 10/28/21 with subsequent surgical site infection and intraabdominal abscess, depression, type 2 diabetes with neuropathy, s/p left foot TMA, chronic left foot wound, hypogonadism, aortic atherosclerosis, gastroparesis, found to had pericardial effusion 2023: outpatient effusion mild to moderate with no evidence of tamponade.  Did well in summer f/u. Had 11/17 discharge from ED: poor prognosis patient was evaluated by palliative care throughout the hospitalization.   NO SHOW.  NO CHARGE.  CHART REVIEWED.     Past Medical History:  Diagnosis Date   Barrett's esophagus dx 2016   Bronchitis    Chronic hepatitis C without hepatic coma ([place]) 10/31/2014   Depression    Diabetes [facility])    ED (erectile dysfunction)    GERD (gastroesophageal reflux disease)    Hepatitis C    Hiatal hernia 10/2014   3cm   Jaundice 02/26/2021   Neuropathy    pancreatic ca 12/2020   Pneumonia 05/14/2020   S/P transmetatarsal amputation of foot, left ([place]) 11/28/2020   Sepsis ([place]) 10/28/2020    Past Surgical History:  Procedure Laterality Date   ABDOMINAL AORTOGRAM W/LOWER EXTREMITY N/A 06/24/2022   Procedure: ABDOMINAL AORTOGRAM W/LOWER EXTREMITY;  Surgeon: Broadus John, MD;  Location: [facility];  Service: Cardiovascular;  Laterality: N/A;   AMPUTATION Left 11/15/2020   Procedure: LEFT TRANSMETATARSALS AMPUTATION;  Surgeon: Newt Minion, MD;  Location: [facility];  Service: Orthopedics;  Laterality: Left;   APPENDECTOMY     BILIARY STENT PLACEMENT N/A 02/27/2021   Procedure: BILIARY STENT PLACEMENT;  Surgeon: Milus Banister, MD;  [facility]: [facility];  Service: Endoscopy;  Laterality: N/A;   BILIARY STENT PLACEMENT N/A 03/27/2021   Procedure: BILIARY STENT PLACEMENT;  Surgeon: Jackquline Denmark, MD;  Location: [facility];  Service: Endoscopy;  Laterality: N/A;   BIOPSY  03/27/2021   Procedure: BIOPSY;  Surgeon: Jackquline Denmark, MD;  [facility];  Service: Endoscopy;;   COLONOSCOPY N/A 02/16/2014   Procedure: COLONOSCOPY;  Surgeon: Gatha Mayer, MD;  [facility];  Service: Endoscopy;  Laterality: N/A;   COLONOSCOPY     ENDOSCOPIC RETROGRADE CHOLANGIOPANCREATOGRAPHY (ERCP) WITH PROPOFOL N/A 02/27/2021   Procedure: ENDOSCOPIC RETROGRADE CHOLANGIOPANCREATOGRAPHY (ERCP) WITH PROPOFOL;  Surgeon: Milus Banister, MD;  [facility];  Service: Endoscopy;  Laterality: N/A;   ERCP N/A 03/27/2021   Procedure: ENDOSCOPIC RETROGRADE CHOLANGIOPANCREATOGRAPHY (ERCP);  Surgeon: Jackquline Denmark, MD;  Location[facility];  Service: Endoscopy;  Laterality: N/A;   ESOPHAGOGASTRODUODENOSCOPY (EGD) WITH PROPOFOL N/A 05/06/2021   Procedure: ESOPHAGOGASTRODUODENOSCOPY (EGD) WITH PROPOFOL;  Surgeon: Mauri Pole, MD;  [facility];  Service: Endoscopy;  Laterality: N/A;   EUS N/A 02/27/2021   Procedure: UPPER ENDOSCOPIC ULTRASOUND (EUS) RADIAL;  Surgeon: Milus Banister, MD;  [facility];  Service: Endoscopy;  Laterality: N/A;   FINE NEEDLE ASPIRATION N/A 02/27/2021   Procedure: FINE NEEDLE ASPIRATION (FNA) LINEAR;  Surgeon: Milus Banister, MD;  Location: [facility];  Service: Endoscopy;  Laterality: N/A;   IR IMAGING GUIDED PORT INSERTION  03/21/2021   SPHINCTEROTOMY  02/27/2021   Procedure: SPHINCTEROTOMY;  Surgeon: Milus Banister, MD;  [facility];  Service: Endoscopy;;   STENT  REMOVAL  03/27/2021   Procedure: STENT REMOVAL;  Surgeon: Jackquline Denmark, MD;  [facility];  Service: Endoscopy;;   TONSILLECTOMY      Current Medications: No outpatient medications have been marked as taking for the 08/12/22 encounter (Office Visit) with Werner Lean, MD.     Allergies:   Bee venom and Lactose intolerance (gi)   Social History   Socioeconomic History   Marital status: Single    Spouse name: Not on file   Number of children: Not on file   Years of education: Not on file   Highest education level: Not on file  Occupational History   Not on file  Tobacco Use   Smoking status: Every Day    Packs/day: 0.50    Years: 35.00    Total pack years: 17.50    Types: Cigarettes   Smokeless tobacco: Never   Tobacco comments:    Currently being followed by the smoking cessation program   Vaping Use   Vaping Use: Never used  Substance and Sexual Activity   Alcohol use: Not Currently    Comment: previous   Drug use: No   Sexual activity: Not on file  Other Topics Concern   Not on file  Social History Narrative   Not on file   Social Determinants of Health   Financial Resource Strain: High Risk (03/13/2021)   Overall Financial Resource Strain (CARDIA)    Difficulty of Paying Living Expenses: Very hard  Food Insecurity: Food Insecurity Present (05/15/2021)   Hunger Vital Sign    Worried About Running Out of Food in the Last Year: Sometimes true    Ran Out of Food in the Last Year: Sometimes true  Transportation Needs: Unmet Transportation Needs (03/13/2021)   PRAPARE - Hydrologist (Medical): Yes    Lack of Transportation (Non-Medical): Yes  Physical Activity: Not on file  Stress: Not on file  Social Connections: Not on file     Family History: The patient's family history includes Alcohol abuse in his father; Breast cancer in his mother; Diabetes in his paternal grandfather; Heart disease in his maternal  grandfather; Pancreatic cancer in his paternal grandmother; Stroke in his father. There is no history of Colon cancer, Stomach cancer, Rectal cancer, or Esophageal cancer.  ROS:   Please see the history of present illness.      EKGs/Labs/Other Studies Reviewed:    The following studies were reviewed today:  Cardiac Studies & Procedures       ECHOCARDIOGRAM  ECHOCARDIOGRAM LIMITED 01/12/2022  Narrative ECHOCARDIOGRAM LIMITED REPORT    Patient Name:   [name] [name] Date of Exam: 01/12/2022 Medical Rec #:  [id number]       Height:       71.0 in Accession #:    [id number]      Weight:       127.0 lb Date of Birth:  04-28-1961       BSA:          1.738 m Patient Age:    69 years        BP:           139/80 mmHg Patient Gender:  M               HR:           102 bpm. Exam Location:  [facility]  Procedure: Limited Echo, Cardiac Doppler and Limited Color Doppler  Indications:    I31.3 Pericardial Effusion  History:        Patient has prior history of Echocardiogram examinations, most recent 01/05/2022. AKI; Risk Factors:Hypertension, Diabetes and HLD.  Sonographer:    Marygrace Drought RCS Referring Phys: [id number] [place] A Myra Weng  IMPRESSIONS   1. Small to moderate pericardial effusion which is unchanged from prior to findings to suggest tamponade. Moderate pericardial effusion. The pericardial effusion is circumferential. There is no evidence of cardiac tamponade. 2. Left ventricular ejection fraction, by estimation, is 65 to 70%. The left ventricle has normal function. The left ventricle has no regional wall motion abnormalities. 3. Right ventricular systolic function is normal. The right ventricular size is normal. 4. The mitral valve is grossly normal. Trivial mitral valve regurgitation. No evidence of mitral stenosis. 5. The aortic valve is tricuspid. Aortic valve regurgitation is not visualized.  Comparison(s): No significant change from prior study.  FINDINGS Left  Ventricle: Left ventricular ejection fraction, by estimation, is 65 to 70%. The left ventricle has normal function. The left ventricle has no regional wall motion abnormalities. The left ventricular internal cavity size was normal in size. There is no left ventricular hypertrophy.  Right Ventricle: The right ventricular size is normal. No increase in right ventricular wall thickness. Right ventricular systolic function is normal.  Left Atrium: Left atrial size was normal in size.  Right Atrium: Right atrial size was normal in size.  Pericardium: Small to moderate pericardial effusion which is unchanged from prior to findings to suggest tamponade. A moderately sized pericardial effusion is present. The pericardial effusion is circumferential. There is no evidence of cardiac tamponade.  Mitral Valve: The mitral valve is grossly normal. Trivial mitral valve regurgitation. No evidence of mitral valve stenosis.  Aortic Valve: The aortic valve is tricuspid. Aortic valve regurgitation is not visualized.  IAS/Shunts: The atrial septum is grossly normal.  LEFT VENTRICLE PLAX 2D LVIDd:         3.20 cm Diastology LVIDs:         2.10 cm LV e' medial:    7.07 cm/s LV PW:         1.00 cm LV E/e' medial:  10.5 LV IVS:        1.00 cm LV e' lateral:   4.13 cm/s LV E/e' lateral: 18.0   LEFT ATRIUM         Index LA diam:    3.10 cm 1.78 cm/m MITRAL VALVE MV Area (PHT): MV Decel Time: MV E velocity: 74.40 cm/s MV A velocity: 76.50 cm/s MV E/A ratio:  0.97  Eleonore Chiquito MD Electronically signed by Eleonore Chiquito MD Signature Date/Time: 01/12/2022/12:21:49 PM    Final              Recent Labs: 01/05/2022: B Natriuretic Peptide 54.1 02/04/2022: TSH 2.190 07/15/2022: Magnesium 1.8 08/11/2022: ALT 22; BUN 9; Creatinine 0.46; Hemoglobin 10.7; Platelet Count 163; Potassium 3.3; Sodium 134  Recent Lipid Panel    Component Value Date/Time   CHOL 174 04/28/2021 0936   TRIG 78 04/28/2021 0936    HDL 70 04/28/2021 0936   CHOLHDL 2.5 04/28/2021 0936   CHOLHDL 4.1 08/18/2016 1459   VLDL 30 08/18/2016 1459   LDLCALC 89 04/28/2021 0936  Physical Exam:    VS:  There were no vitals taken for this visit.    Wt Readings from Last 3 Encounters:  08/11/22 125 lb 4.8 oz (56.8 kg)  07/17/22 135 lb 12.9 oz (61.6 kg)  06/30/22 121 lb 3 oz (55 kg)    NO SHOW.  NO CHARGE.  CHART REVIEWED.      ASSESSMENT:    No diagnosis found. PLAN:    Pericardial effusion Pancreatic adenocarcinoma with prior chemoradiation S/p FOLFIRINOX 2023 Intraabdominal abscess - poor prognosis patient was evaluated by palliative care throughout the hospitalization.   Aortic atherosclerosis  HTN  PRN f/u  NO SHOW.  NO CHARGE.  CHART REVIEWED.  Does not need continued Cardiac involvement unless change in care plan (oncology)          Medication Adjustments/Labs and Tests Ordered: Current medicines are reviewed at length with the patient today.  Concerns regarding medicines are outlined above.  No orders of the defined types were placed in this encounter.  No orders of the defined types were placed in this encounter.   There are no Patient Instructions on file for this visit.   Signed, Werner Lean, MD  08/12/2022 10:12 AM    [place]

## 2022-08-12 ENCOUNTER — Ambulatory Visit (INDEPENDENT_AMBULATORY_CARE_PROVIDER_SITE_OTHER): Payer: Medicaid Other | Admitting: Internal Medicine

## 2022-08-12 DIAGNOSIS — I3139 Other pericardial effusion (noninflammatory): Secondary | ICD-10-CM

## 2022-08-13 ENCOUNTER — Telehealth: Payer: Self-pay | Admitting: Hematology and Oncology

## 2022-08-13 ENCOUNTER — Encounter: Payer: Self-pay | Admitting: Hematology and Oncology

## 2022-08-13 NOTE — Telephone Encounter (Signed)
Called patient to schedule f/u appointment. Patient notified of new appointment.

## 2022-08-18 ENCOUNTER — Other Ambulatory Visit: Payer: Self-pay

## 2022-08-18 ENCOUNTER — Other Ambulatory Visit (HOSPITAL_COMMUNITY): Payer: Self-pay

## 2022-08-18 ENCOUNTER — Encounter: Payer: Self-pay | Admitting: Hematology and Oncology

## 2022-08-18 MED ORDER — ZOLPIDEM TARTRATE 10 MG PO TABS: 10.0000 mg | ORAL_TABLET | Freq: Every evening | ORAL | 0 refills | Status: DC | PRN

## 2022-08-18 MED ORDER — OXYCODONE HCL 10 MG PO TABS
10.0000 mg | ORAL_TABLET | Freq: Four times a day (QID) | ORAL | 0 refills | Status: AC | PRN
  Filled 2022-08-18: qty 60, 15d supply, fill #0

## 2022-08-24 ENCOUNTER — Other Ambulatory Visit: Payer: Self-pay

## 2022-08-24 ENCOUNTER — Observation Stay (HOSPITAL_COMMUNITY)
Admission: EM | Admit: 2022-08-24 | Discharge: 2022-08-24 | Disposition: A | Discharge status: Hospice | Payer: Medicaid Other | Attending: Internal Medicine | Admitting: Internal Medicine

## 2022-08-24 ENCOUNTER — Encounter (HOSPITAL_COMMUNITY): Payer: Self-pay

## 2022-08-24 DIAGNOSIS — Z79899 Other long term (current) drug therapy: Secondary | ICD-10-CM | POA: Diagnosis not present

## 2022-08-24 DIAGNOSIS — I81 Portal vein thrombosis: Secondary | ICD-10-CM | POA: Diagnosis present

## 2022-08-24 DIAGNOSIS — E119 Type 2 diabetes mellitus without complications: Secondary | ICD-10-CM | POA: Diagnosis not present

## 2022-08-24 DIAGNOSIS — R627 Adult failure to thrive: Secondary | ICD-10-CM | POA: Insufficient documentation

## 2022-08-24 DIAGNOSIS — Z7901 Long term (current) use of anticoagulants: Secondary | ICD-10-CM | POA: Diagnosis not present

## 2022-08-24 DIAGNOSIS — Z7982 Long term (current) use of aspirin: Secondary | ICD-10-CM | POA: Diagnosis not present

## 2022-08-24 DIAGNOSIS — Z8507 Personal history of malignant neoplasm of pancreas: Secondary | ICD-10-CM | POA: Diagnosis not present

## 2022-08-24 DIAGNOSIS — R17 Unspecified jaundice: Principal | ICD-10-CM | POA: Insufficient documentation

## 2022-08-24 DIAGNOSIS — F17210 Nicotine dependence, cigarettes, uncomplicated: Secondary | ICD-10-CM | POA: Diagnosis not present

## 2022-08-24 DIAGNOSIS — Y92009 Unspecified place in unspecified non-institutional (private) residence as the place of occurrence of the external cause: Secondary | ICD-10-CM | POA: Insufficient documentation

## 2022-08-24 DIAGNOSIS — K219 Gastro-esophageal reflux disease without esophagitis: Secondary | ICD-10-CM | POA: Diagnosis present

## 2022-08-24 DIAGNOSIS — W19XXXA Unspecified fall, initial encounter: Secondary | ICD-10-CM | POA: Insufficient documentation

## 2022-08-24 DIAGNOSIS — C787 Secondary malignant neoplasm of liver and intrahepatic bile duct: Secondary | ICD-10-CM | POA: Diagnosis not present

## 2022-08-24 DIAGNOSIS — C259 Malignant neoplasm of pancreas, unspecified: Secondary | ICD-10-CM | POA: Diagnosis not present

## 2022-08-24 DIAGNOSIS — Z794 Long term (current) use of insulin: Secondary | ICD-10-CM | POA: Insufficient documentation

## 2022-08-24 DIAGNOSIS — F339 Major depressive disorder, recurrent, unspecified: Secondary | ICD-10-CM | POA: Diagnosis present

## 2022-08-24 DIAGNOSIS — E1140 Type 2 diabetes mellitus with diabetic neuropathy, unspecified: Secondary | ICD-10-CM

## 2022-08-24 LAB — GLUCOSE, CAPILLARY
Glucose-Capillary: 175 mg/dL — ABNORMAL HIGH (ref 70–99)
Glucose-Capillary: 182 mg/dL — ABNORMAL HIGH (ref 70–99)

## 2022-08-24 MED ORDER — ACETAMINOPHEN 325 MG PO TABS: 650.0000 mg | ORAL_TABLET | Freq: Four times a day (QID) | ORAL | Status: DC | PRN

## 2022-08-24 MED ORDER — ALBUTEROL SULFATE (2.5 MG/3ML) 0.083% IN NEBU: 3.0000 mL | INHALATION_SOLUTION | RESPIRATORY_TRACT | Status: DC | PRN

## 2022-08-24 MED ORDER — ENSURE ENLIVE PO LIQD
237.0000 mL | Freq: Once | ORAL | Status: AC
  Administered 2022-08-24: 237 mL via ORAL
  Filled 2022-08-24: qty 237

## 2022-08-24 MED ORDER — PANCRELIPASE (LIP-PROT-AMYL) 12000-38000 UNITS PO CPEP: 72000.0000 [IU] | ORAL_CAPSULE | Freq: Three times a day (TID) | ORAL | Status: DC

## 2022-08-24 MED ORDER — THIAMINE MONONITRATE 100 MG PO TABS: 100.0000 mg | ORAL_TABLET | Freq: Every day | ORAL | Status: DC

## 2022-08-24 MED ORDER — ACAMPROSATE CALCIUM 333 MG PO TBEC
666.0000 mg | DELAYED_RELEASE_TABLET | Freq: Three times a day (TID) | ORAL | Status: DC
  Filled 2022-08-24: qty 2

## 2022-08-24 MED ORDER — PANTOPRAZOLE SODIUM 40 MG PO TBEC: 40.0000 mg | DELAYED_RELEASE_TABLET | Freq: Every day | ORAL | Status: DC

## 2022-08-24 MED ORDER — ADULT MULTIVITAMIN W/MINERALS CH: 1.0000 | ORAL_TABLET | Freq: Every day | ORAL | Status: DC

## 2022-08-24 MED ORDER — HYDROMORPHONE HCL 1 MG/ML IJ SOLN
1.0000 mg | Freq: Once | INTRAMUSCULAR | Status: AC
  Administered 2022-08-24: 1 mg via INTRAVENOUS
  Filled 2022-08-24: qty 1

## 2022-08-24 MED ORDER — POLYETHYLENE GLYCOL 3350 17 G PO PACK: 17.0000 g | PACK | Freq: Every day | ORAL | Status: DC | PRN

## 2022-08-24 MED ORDER — GLYCOPYRROLATE 0.2 MG/ML IJ SOLN: 0.2000 mg | INTRAMUSCULAR | Status: DC | PRN

## 2022-08-24 MED ORDER — MORPHINE SULFATE (PF) 2 MG/ML IV SOLN
2.0000 mg | INTRAVENOUS | Status: DC | PRN
  Administered 2022-08-24: 4 mg via INTRAVENOUS
  Filled 2022-08-24: qty 2

## 2022-08-24 MED ORDER — SODIUM CHLORIDE 0.9 % IV SOLN: INTRAVENOUS | Status: DC

## 2022-08-24 MED ORDER — OXYCODONE HCL 5 MG PO TABS: 10.0000 mg | ORAL_TABLET | Freq: Four times a day (QID) | ORAL | Status: DC | PRN

## 2022-08-24 MED ORDER — PREGABALIN 75 MG PO CAPS: 75.0000 mg | ORAL_CAPSULE | Freq: Every day | ORAL | Status: DC

## 2022-08-24 MED ORDER — ONDANSETRON HCL 4 MG PO TABS: 4.0000 mg | ORAL_TABLET | Freq: Four times a day (QID) | ORAL | Status: DC | PRN

## 2022-08-24 MED ORDER — INSULIN ASPART 100 UNIT/ML IJ SOLN: 0.0000 [IU] | Freq: Three times a day (TID) | INTRAMUSCULAR | Status: DC

## 2022-08-24 MED ORDER — BISACODYL 5 MG PO TBEC: 5.0000 mg | DELAYED_RELEASE_TABLET | Freq: Every day | ORAL | Status: DC | PRN

## 2022-08-24 MED ORDER — ONDANSETRON HCL 4 MG/2ML IJ SOLN: 4.0000 mg | Freq: Four times a day (QID) | INTRAMUSCULAR | Status: DC | PRN

## 2022-08-24 MED ORDER — APIXABAN 5 MG PO TABS: 5.0000 mg | ORAL_TABLET | Freq: Two times a day (BID) | ORAL | Status: DC

## 2022-08-24 MED ORDER — MIRTAZAPINE 30 MG PO TABS: 30.0000 mg | ORAL_TABLET | Freq: Every day | ORAL | Status: DC

## 2022-08-24 MED ORDER — GLYCOPYRROLATE 1 MG PO TABS
1.0000 mg | ORAL_TABLET | ORAL | Status: DC | PRN
  Filled 2022-08-24: qty 1

## 2022-08-24 MED ORDER — PROSOURCE PLUS PO LIQD: 30.0000 mL | Freq: Two times a day (BID) | ORAL | Status: DC

## 2022-08-24 MED ORDER — ONDANSETRON HCL 4 MG/2ML IJ SOLN
4.0000 mg | Freq: Once | INTRAMUSCULAR | Status: AC
  Administered 2022-08-24: 4 mg via INTRAVENOUS
  Filled 2022-08-24: qty 2

## 2022-08-24 MED ORDER — FENTANYL 12 MCG/HR TD PT72: 1.0000 | MEDICATED_PATCH | TRANSDERMAL | Status: DC

## 2022-08-24 MED ORDER — POLYVINYL ALCOHOL 1.4 % OP SOLN: 1.0000 [drp] | Freq: Four times a day (QID) | OPHTHALMIC | Status: DC | PRN

## 2022-08-24 MED ORDER — ZOLPIDEM TARTRATE 5 MG PO TABS: 5.0000 mg | ORAL_TABLET | Freq: Every evening | ORAL | Status: DC | PRN

## 2022-08-24 MED ORDER — INSULIN GLARGINE-YFGN 100 UNIT/ML ~~LOC~~ SOLN
10.0000 [IU] | Freq: Every day | SUBCUTANEOUS | Status: DC
  Filled 2022-08-24 (×2): qty 0.1

## 2022-08-24 MED ORDER — ACETAMINOPHEN 650 MG RE SUPP: 650.0000 mg | Freq: Four times a day (QID) | RECTAL | Status: DC | PRN

## 2022-08-24 NOTE — Progress Notes (Addendum)
Manufacturing engineer [employer]) Hospital Liaison Note  This is a current [facility] hospice patient. Referral received for patient/family interest in [facility] or [facility]. Chart under review by [facility] physician.   Hospice eligibility confirmed.   Plan is to transfer to [facility] tomorrow for symptom management and EOL care.   Please call with any questions or concerns. Thank you  [facility] Liaison [phone]  Addendum 3:23 pm-  Plan is for patient to transfer to the [facility] today.   For transport, [facility] to be notified of patient D/C and transport arranged. IDT aware of above updates.   Please send signed DNR form with patient and RN call report to [phone].    Daphene Calamity, MSW [facility] Liaison [phone]

## 2022-08-24 NOTE — H&P (Signed)
History and Physical    Patient: [name] [name] YTW:[id number] DOB: Nov 02, 1960 DOA: 08/24/2022 DOS: the patient was seen and examined on 08/24/2022 PCP: [name], NP  Patient coming from: Home  Chief Complaint:  Chief Complaint  Patient presents with   Fall   HPI: [name] [name] is a 61 y.o. 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  No injuries reported. He denied fever, chills, rhinorrhea, sore throat, wheezing or hemoptysis.  No chest pain, palpitations, diaphoresis, PND, orthopnea or pitting edema of the lower extremities.  Positive chronic abdominal pain, nausea, but no emesis, diarrhea, constipation, melena or hematochezia.  No flank pain, dysuria, frequency or hematuria.  No polyuria, polydipsia, polyphagia or blurred vision.   ED course: Initial vital signs were temperature 96.7, pulse 110, respiration 14, BP 116/83 mmHg O2 sat 97% on 2 LPM.  The patient received hydromorphone 1 mg IVP and ondansetron 4 mg IVP.  Review of Systems: As mentioned in the history of present illness. All other systems reviewed and are negative. Past Medical History:  Diagnosis Date   Barrett's esophagus dx 2016   Bronchitis    Chronic hepatitis C without hepatic coma ([place]) 10/31/2014   Depression    Diabetes [facility])    ED (erectile dysfunction)    GERD (gastroesophageal reflux disease)    Hepatitis C    Hiatal hernia 10/2014   3cm   Jaundice 02/26/2021   Neuropathy    pancreatic ca 12/2020   Pneumonia 05/14/2020   S/P transmetatarsal amputation of foot, left ([place]) 11/28/2020   Sepsis ([place]) 10/28/2020   Past Surgical History:  Procedure Laterality Date   ABDOMINAL  AORTOGRAM W/LOWER EXTREMITY N/A 06/24/2022   Procedure: ABDOMINAL AORTOGRAM W/LOWER EXTREMITY;  Surgeon: Broadus John, MD;  Location: [place] [facility];  Service: Cardiovascular;  Laterality: N/A;   AMPUTATION Left 11/15/2020   Procedure: LEFT TRANSMETATARSALS AMPUTATION;  Surgeon: Newt Minion, MD;  Location: [place];  Service: Orthopedics;  Laterality: Left;   APPENDECTOMY     BILIARY STENT PLACEMENT N/A 02/27/2021   Procedure: BILIARY STENT PLACEMENT;  Surgeon: Milus Banister, MD;  Location: [facility];  Service: Endoscopy;  Laterality: N/A;   BILIARY STENT PLACEMENT N/A 03/27/2021   Procedure: BILIARY STENT PLACEMENT;  Surgeon: Jackquline Denmark, MD;  Location: [facility];  Service: Endoscopy;  Laterality: N/A;   BIOPSY  03/27/2021   Procedure: BIOPSY;  Surgeon: Jackquline Denmark, MD;  Location: [facility];  Service: Endoscopy;;   COLONOSCOPY N/A 02/16/2014   Procedure: COLONOSCOPY;  Surgeon: Gatha Mayer, MD;  Location: [facility];  Service: Endoscopy;  Laterality: N/A;   COLONOSCOPY     ENDOSCOPIC RETROGRADE CHOLANGIOPANCREATOGRAPHY (ERCP) WITH PROPOFOL N/A 02/27/2021   Procedure: ENDOSCOPIC RETROGRADE CHOLANGIOPANCREATOGRAPHY (ERCP) WITH PROPOFOL;  Surgeon: Milus Banister, MD;  Location: [facility];  Service: Endoscopy;  Laterality: N/A;   ERCP N/A 03/27/2021   Procedure: ENDOSCOPIC RETROGRADE CHOLANGIOPANCREATOGRAPHY (ERCP);  Surgeon: Jackquline Denmark, MD;  Location: [facility];  Service: Endoscopy;  Laterality: N/A;   ESOPHAGOGASTRODUODENOSCOPY (EGD) WITH PROPOFOL N/A 05/06/2021   Procedure: ESOPHAGOGASTRODUODENOSCOPY (EGD) WITH PROPOFOL;  Surgeon: Mauri Pole, MD;  Location: [facility];  Service: Endoscopy;  Laterality: N/A;   EUS N/A 02/27/2021  Procedure: UPPER ENDOSCOPIC ULTRASOUND (EUS) RADIAL;  Surgeon: Milus Banister, MD;  Location: [facility];  Service: Endoscopy;  Laterality: N/A;   FINE NEEDLE ASPIRATION N/A 02/27/2021   Procedure: FINE NEEDLE ASPIRATION  (FNA) LINEAR;  Surgeon: Milus Banister, MD;  Location: [facility];  Service: Endoscopy;  Laterality: N/A;   IR IMAGING GUIDED PORT INSERTION  03/21/2021   SPHINCTEROTOMY  02/27/2021   Procedure: SPHINCTEROTOMY;  Surgeon: Milus Banister, MD;  Location: [facility];  Service: Endoscopy;;   STENT REMOVAL  03/27/2021   Procedure: STENT REMOVAL;  Surgeon: Jackquline Denmark, MD;  Location: [facility];  Service: Endoscopy;;   TONSILLECTOMY     Social History:  reports that he has been smoking cigarettes. He has a 17.50 pack-year smoking history. He has never used smokeless tobacco. He reports that he does not currently use alcohol. He reports that he does not use drugs.  Allergies  Allergen Reactions   Bee Venom Anaphylaxis   Lactose Intolerance (Gi) Other (See Comments)    GI upset    Family History  Problem Relation Age of Onset   Breast cancer Mother    Stroke Father    Alcohol abuse Father    Heart disease Maternal Grandfather    Pancreatic cancer Paternal Grandmother    Diabetes Paternal Grandfather    Colon cancer Neg Hx    Stomach cancer Neg Hx    Rectal cancer Neg Hx    Esophageal cancer Neg Hx     Prior to Admission medications   Medication Sig Start Date End Date Taking? Authorizing Provider  acamprosate (CAMPRAL) 333 MG tablet Take 2 tablets (666 mg total) by mouth 3 (three) times daily with meals. 07/17/22   Shalhoub, Sherryll Burger, MD  acetaminophen (TYLENOL) 500 MG tablet Take 1 tablet by mouth every 6 hours as needed for moderate pain. Patient taking differently: Take 1,500 mg by mouth 2 (two) times daily as needed for moderate pain. 03/23/22   [name], NP  albuterol (VENTOLIN HFA) 108 (90 Base) MCG/ACT inhaler Inhale 2 puffs into the lungs every 6 hours as needed for wheezing or shortness of breath. 07/20/22   Lincoln Brigham, PA-C  alum & mag hydroxide-simeth (MAALOX/MYLANTA) 200-200-20 MG/5ML suspension Take 30 mLs by mouth every 4 (four) hours as needed for  indigestion. 07/17/22   Shalhoub, Sherryll Burger, MD  apixaban (ELIQUIS) 5 MG TABS tablet Take 2 tablets (10 mg total) by mouth 2 (two) times daily for 3 doses. 07/17/22 07/19/22  Shalhoub, Sherryll Burger, MD  apixaban (ELIQUIS) 5 MG TABS tablet Take 1 tablet (5 mg total) by mouth 2 (two) times daily. To be taken beginning 07/19/2022 07/19/22   Vernelle Emerald, MD  aspirin EC 81 MG tablet Take 1 tablet by mouth daily. 06/30/22   Charlott Rakes, MD  benzonatate (TESSALON) 100 MG capsule Take 1 capsule (100 mg total) by mouth 3 (three) times daily as needed for cough. 07/17/22   Shalhoub, Sherryll Burger, MD  bisacodyl (DULCOLAX) 5 MG EC tablet Take 1 tablet (5 mg total) by mouth daily as needed for moderate constipation. 07/17/22   Shalhoub, Sherryll Burger, MD  diphenoxylate-atropine (LOMOTIL) 2.5-0.025 MG tablet Take 1 tablet by mouth 4 times daily as needed for diarrhea or loose stools. 06/30/22   [name], NP  EPINEPHrine 0.3 mg/0.3 mL IJ SOAJ injection Inject 0.3 mg into the muscle once as needed for anaphylaxis. 05/07/22   [name], NP  escitalopram (LEXAPRO) 10 MG tablet  Take 1 tablet (10 mg total) by mouth daily. 05/06/22   [name], NP  feeding supplement (ENSURE ENLIVE / ENSURE PLUS) LIQD Take 237 mLs by mouth 3 (three) times daily between meals. 07/17/22   Shalhoub, Sherryll Burger, MD  fentaNYL (DURAGESIC) 12 MCG/HR Place 1 patch onto the skin every 3 (three) days. 08/11/22   Pickenpack-Cousar, Carlena Sax, NP  folic acid (FOLVITE) 1 MG tablet Take 1 tablet (1 mg total) by mouth in the morning. 03/28/21   Rai, Ripudeep K, MD  insulin aspart (NOVOLOG) 100 UNIT/ML injection Inject 0-6 Units into the skin 3 (three) times daily with meals. 07/17/22   Shalhoub, Sherryll Burger, MD  insulin aspart (NOVOLOG) 100 UNIT/ML injection Inject 0-5 Units into the skin at bedtime. 07/17/22   Shalhoub, Sherryll Burger, MD  insulin glargine-yfgn (SEMGLEE) 100 UNIT/ML injection Inject 0.1 mLs (10 Units total) into the skin at bedtime.  07/17/22   Shalhoub, Sherryll Burger, MD  lipase/protease/amylase (CREON) 36000 UNITS CPEP capsule Take 1 capsule (36,000 Units total) by mouth 3 (three) times daily with meals. May take 1 additional capsule with snacks Patient taking differently: Take 72,000 Units by mouth 3 (three) times daily with meals. 03/30/22   [name], NP  mirtazapine (REMERON) 30 MG tablet Take 1 tablet (30 mg total) by mouth at bedtime. 07/20/22   [name], NP  Multiple Vitamins-Minerals (THEREMS-M) TABS Take 1 tablet by mouth in the morning. 07/20/22   Charlott Rakes, MD  Nutritional Supplements (,FEEDING SUPPLEMENT, PROSOURCE PLUS) liquid Take 30 mLs by mouth 2 (two) times daily between meals. 07/17/22   Shalhoub, Sherryll Burger, MD  ondansetron (ZOFRAN) 8 MG tablet Take 1 tablet (8 mg total) by mouth 2 (two) times daily as needed. Start on day 3 after chemotherapy. 07/20/22   Lincoln Brigham, PA-C  Oxycodone HCl 10 MG TABS Take 1 tablet (10 mg total) by mouth every 6 (six) hours as needed. 08/11/22   Pickenpack-Cousar, Carlena Sax, NP  Oxycodone HCl 10 MG TABS Take 1 tablet (10 mg total) by mouth every 6 (six) hours as needed for pain. 08/17/22     pantoprazole (PROTONIX) 40 MG tablet Take 1 tablet by mouth daily. 05/07/22   Charlott Rakes, MD  polyethylene glycol (MIRALAX / GLYCOLAX) 17 g packet Take 17 g by mouth daily as needed for mild constipation. 07/17/22   Shalhoub, Sherryll Burger, MD  pregabalin (LYRICA) 75 MG capsule Take 1 capsule (75 mg total) by mouth daily. 07/20/22   [name], NP  rosuvastatin (CRESTOR) 10 MG tablet Take 1 tablet (10 mg total) by mouth daily. 07/18/22   Shalhoub, Sherryll Burger, MD  thiamine (VITAMIN B-1) 100 MG tablet Take 1 tablet (100 mg total) by mouth daily. 07/18/22   Shalhoub, Sherryll Burger, MD  zolpidem (AMBIEN) 10 MG tablet Take 1 tablet (10 mg total) by mouth at bedtime as needed for sleep. 07/27/22 09/25/22  [name], NP  zolpidem (AMBIEN) 10 MG tablet Take 1 tablet (10 mg total)  by mouth at bedtime as needed for sleep. 08/17/22       Physical Exam: Vitals:   08/24/22 1000 08/24/22 1036 08/24/22 1100 08/24/22 1200  BP: 132/87  127/84 116/79  Pulse: (!) 112  (!) 110 (!) 117  Resp: '13  11 19  '$ Temp:  (!) 97.5 F (36.4 C)    TempSrc:  Oral    SpO2: 98%  98% 91%   Physical Exam Vitals and nursing note reviewed.  Constitutional:      General: He is awake.     Appearance: He is cachectic. He is ill-appearing.  HENT:     Head: Normocephalic.     Nose: No rhinorrhea.     Mouth/Throat:     Mouth: Mucous membranes are moist.  Eyes:     General: No scleral icterus.    Pupils: Pupils are equal, round, and reactive to light.  Neck:     Vascular: No JVD.  Cardiovascular:     Rate and Rhythm: Normal rate and regular rhythm.     Heart sounds: S1 normal and S2 normal.  Pulmonary:     Breath sounds: Normal breath sounds. No wheezing, rhonchi or rales.  Abdominal:     General: Bowel sounds are normal. There is no distension.     Palpations: Abdomen is soft.     Tenderness: There is abdominal tenderness in the right upper quadrant and epigastric area.  Musculoskeletal:     Cervical back: Neck supple.     Right lower leg: No edema.     Left lower leg: No edema.  Skin:    General: Skin is warm and dry.  Neurological:     General: No focal deficit present.     Mental Status: He is alert and oriented to person, place, and time.  Psychiatric:        Mood and Affect: Mood normal.        Behavior: Behavior is cooperative.      Data Reviewed:  There are no new results to review at this time.  Assessment and Plan: Principal Problem:   Fall, initial encounter Failure to thrive Observation/MedSurg. Unable to take care for himself at home. Referred to residential hospice.  Active Problems:   Portal vein thrombosis On apixaban.    Type 2 diabetes mellitus with diabetic neuropathy, unspecified (HCC) Carbohydrate modified diet. Continue Semglee 10 units  SQ. CBG monitoring before meals and bedtime.    GERD without esophagitis Continue pantoprazole 40 mg p.o. daily.    Major depression, recurrent, chronic (HCC) Continue mirtazapine 30 mg p.o. daily.    Diabetic neuropathy (HCC) Continue pregabalin 75 mg p.o. daily.    Pancreatic cancer ([place]) Continue Duragesic patch and oxycodone as needed.     Advance Care Planning:   Code Status: DNR   Consults:   Family Communication:   Severity of Illness: The appropriate patient status for this patient is OBSERVATION. Observation status is judged to be reasonable and necessary in order to provide the required intensity of service to ensure the patient's safety. The patient's presenting symptoms, physical exam findings, and initial radiographic and laboratory data in the context of their medical condition is felt to place them at decreased risk for further clinical deterioration. Furthermore, it is anticipated that the patient will be medically stable for discharge from the hospital within 2 midnights of admission.   Author: Reubin Milan, MD 08/24/2022 1:00 PM  For on call review [email or web address].   This document was prepared using Dragon voice recognition software and may contain some unintended transcription errors.

## 2022-08-24 NOTE — Progress Notes (Signed)
Called report to Beverely Low ,RN at [facility] in [place].

## 2022-08-24 NOTE — ED Triage Notes (Signed)
Pt BIB EMS from home for a fall. Pt is on end of life care w/ liver and pancreatic cancer. Pt caregiver left at 9pm last night and he fell around 11pm after trying to get to his wheelchair.

## 2022-08-24 NOTE — ED Provider Notes (Addendum)
[facility] DEPT Provider Note: Georgena Spurling, MD, FACEP  CSN: [id number] MRN: [id number] ARRIVAL: 08/24/22 at [place]: [place]   HISTORY OF PRESENT ILLNESS  08/24/22 5:54 AM [name] [name] is a 61 y.o. male end-stage pancreatic cancer on hospice care.  He was attempting to transfer from his wheelchair to the bathroom just prior to arrival and fell.  He did not injure himself.  He denies any pain apart from his chronic cancer pain.  He admits to not having much appetite but would like to drink an Ensure right now.  He acknowledges being weak but has not been falling more frequently recently.     Past Medical History:  Diagnosis Date   Barrett's esophagus dx 2016   Bronchitis    Chronic hepatitis C without hepatic coma ([place]) 10/31/2014   Depression    Diabetes [facility])    ED (erectile dysfunction)    GERD (gastroesophageal reflux disease)    Hepatitis C    Hiatal hernia 10/2014   3cm   Jaundice 02/26/2021   Neuropathy    pancreatic ca 12/2020   Pneumonia 05/14/2020   S/P transmetatarsal amputation of foot, left ([place]) 11/28/2020   Sepsis ([place]) 10/28/2020    Past Surgical History:  Procedure Laterality Date   ABDOMINAL AORTOGRAM W/LOWER EXTREMITY N/A 06/24/2022   Procedure: ABDOMINAL AORTOGRAM W/LOWER EXTREMITY;  Surgeon: Broadus Gaines Cartmell, MD;  Location: [facility] LAB;  Service: Cardiovascular;  Laterality: N/A;   AMPUTATION Left 11/15/2020   Procedure: LEFT TRANSMETATARSALS AMPUTATION;  Surgeon: Newt Minion, MD;  Location: [place];  Service: Orthopedics;  Laterality: Left;   APPENDECTOMY     BILIARY STENT PLACEMENT N/A 02/27/2021   Procedure: BILIARY STENT PLACEMENT;  Surgeon: Milus Banister, MD;  [facility]: [facility];  Service: Endoscopy;  Laterality: N/A;   BILIARY STENT PLACEMENT N/A 03/27/2021   Procedure: BILIARY STENT PLACEMENT;  Surgeon: Jackquline Denmark, MD;  [facility];  Service: Endoscopy;  Laterality: N/A;    BIOPSY  03/27/2021   Procedure: BIOPSY;  Surgeon: Jackquline Denmark, MD;  [facility];  Service: Endoscopy;;   COLONOSCOPY N/A 02/16/2014   Procedure: COLONOSCOPY;  Surgeon: Gatha Mayer, MD;  [facility];  Service: Endoscopy;  Laterality: N/A;   COLONOSCOPY     ENDOSCOPIC RETROGRADE CHOLANGIOPANCREATOGRAPHY (ERCP) WITH PROPOFOL N/A 02/27/2021   Procedure: ENDOSCOPIC RETROGRADE CHOLANGIOPANCREATOGRAPHY (ERCP) WITH PROPOFOL;  Surgeon: Milus Banister, MD;  [facility];  Service: Endoscopy;  Laterality: N/A;   ERCP N/A 03/27/2021   Procedure: ENDOSCOPIC RETROGRADE CHOLANGIOPANCREATOGRAPHY (ERCP);  Surgeon: Jackquline Denmark, MD;  Location[facility];  Service: Endoscopy;  Laterality: N/A;   ESOPHAGOGASTRODUODENOSCOPY (EGD) WITH PROPOFOL N/A 05/06/2021   Procedure: ESOPHAGOGASTRODUODENOSCOPY (EGD) WITH PROPOFOL;  Surgeon: Mauri Pole, MD;  [facility];  Service: Endoscopy;  Laterality: N/A;   EUS N/A 02/27/2021   Procedure: UPPER ENDOSCOPIC ULTRASOUND (EUS) RADIAL;  Surgeon: Milus Banister, MD;  [facility];  Service: Endoscopy;  Laterality: N/A;   FINE NEEDLE ASPIRATION N/A 02/27/2021   Procedure: FINE NEEDLE ASPIRATION (FNA) LINEAR;  Surgeon: Milus Banister, MD;  [facility];  Service: Endoscopy;  Laterality: N/A;   IR IMAGING GUIDED PORT INSERTION  03/21/2021   SPHINCTEROTOMY  02/27/2021   Procedure: SPHINCTEROTOMY;  Surgeon: Milus Banister, MD;  [facility];  Service: Endoscopy;;   STENT REMOVAL  03/27/2021   Procedure: Lavell Islam REMOVAL;  Surgeon: Jackquline Denmark, MD;  Location: [facility];  Service: Endoscopy;;   TONSILLECTOMY      Family History  Problem Relation Age of Onset   Breast cancer Mother    Stroke Father    Alcohol abuse Father    Heart disease Maternal Grandfather    Pancreatic cancer Paternal Grandmother    Diabetes Paternal Grandfather    Colon cancer Neg Hx    Stomach cancer Neg Hx    Rectal  cancer Neg Hx    Esophageal cancer Neg Hx     Social History   Tobacco Use   Smoking status: Every Day    Packs/day: 0.50    Years: 35.00    Total pack years: 17.50    Types: Cigarettes   Smokeless tobacco: Never   Tobacco comments:    Currently being followed by the smoking cessation program   Vaping Use   Vaping Use: Never used  Substance Use Topics   Alcohol use: Not Currently    Comment: previous   Drug use: No    Prior to Admission medications   Medication Sig Start Date End Date Taking? Authorizing Provider  acamprosate (CAMPRAL) 333 MG tablet Take 2 tablets (666 mg total) by mouth 3 (three) times daily with meals. 07/17/22   Shalhoub, Sherryll Burger, MD  acetaminophen (TYLENOL) 500 MG tablet Take 1 tablet by mouth every 6 hours as needed for moderate pain. Patient taking differently: Take 1,500 mg by mouth 2 (two) times daily as needed for moderate pain. 03/23/22   [name], NP  albuterol (VENTOLIN HFA) 108 (90 Base) MCG/ACT inhaler Inhale 2 puffs into the lungs every 6 hours as needed for wheezing or shortness of breath. 07/20/22   Lincoln Brigham, PA-C  alum & mag hydroxide-simeth (MAALOX/MYLANTA) 200-200-20 MG/5ML suspension Take 30 mLs by mouth every 4 (four) hours as needed for indigestion. 07/17/22   Shalhoub, Sherryll Burger, MD  apixaban (ELIQUIS) 5 MG TABS tablet Take 2 tablets (10 mg total) by mouth 2 (two) times daily for 3 doses. 07/17/22 07/19/22  Shalhoub, Sherryll Burger, MD  apixaban (ELIQUIS) 5 MG TABS tablet Take 1 tablet (5 mg total) by mouth 2 (two) times daily. To be taken beginning 07/19/2022 07/19/22   Vernelle Emerald, MD  aspirin EC 81 MG tablet Take 1 tablet by mouth daily. 06/30/22   Charlott Rakes, MD  benzonatate (TESSALON) 100 MG capsule Take 1 capsule (100 mg total) by mouth 3 (three) times daily as needed for cough. 07/17/22   Shalhoub, Sherryll Burger, MD  bisacodyl (DULCOLAX) 5 MG EC tablet Take 1 tablet (5 mg total) by mouth daily as needed for moderate  constipation. 07/17/22   Shalhoub, Sherryll Burger, MD  diphenoxylate-atropine (LOMOTIL) 2.5-0.025 MG tablet Take 1 tablet by mouth 4 times daily as needed for diarrhea or loose stools. 06/30/22   [name], NP  EPINEPHrine 0.3 mg/0.3 mL IJ SOAJ injection Inject 0.3 mg into the muscle once as needed for anaphylaxis. 05/07/22   [name], NP  escitalopram (LEXAPRO) 10 MG tablet Take 1 tablet (10 mg total) by mouth daily. 05/06/22   [name], NP  feeding supplement (ENSURE ENLIVE / ENSURE PLUS) LIQD Take 237 mLs by mouth 3 (three) times daily between meals. 07/17/22   Shalhoub, Sherryll Burger, MD  fentaNYL (DURAGESIC) 12 MCG/HR Place 1 patch onto the skin every 3 (three) days. 08/11/22   Pickenpack-Cousar, Carlena Sax, NP  folic acid (FOLVITE) 1 MG tablet Take 1 tablet (1 mg total) by mouth in  the morning. 03/28/21   Rai, Ripudeep K, MD  insulin aspart (NOVOLOG) 100 UNIT/ML injection Inject 0-6 Units into the skin 3 (three) times daily with meals. 07/17/22   Shalhoub, Sherryll Burger, MD  insulin aspart (NOVOLOG) 100 UNIT/ML injection Inject 0-5 Units into the skin at bedtime. 07/17/22   Shalhoub, Sherryll Burger, MD  insulin glargine-yfgn (SEMGLEE) 100 UNIT/ML injection Inject 0.1 mLs (10 Units total) into the skin at bedtime. 07/17/22   Shalhoub, Sherryll Burger, MD  lipase/protease/amylase (CREON) 36000 UNITS CPEP capsule Take 1 capsule (36,000 Units total) by mouth 3 (three) times daily with meals. May take 1 additional capsule with snacks Patient taking differently: Take 72,000 Units by mouth 3 (three) times daily with meals. 03/30/22   [name], NP  mirtazapine (REMERON) 30 MG tablet Take 1 tablet (30 mg total) by mouth at bedtime. 07/20/22   [name], NP  Multiple Vitamins-Minerals (THEREMS-M) TABS Take 1 tablet by mouth in the morning. 07/20/22   Charlott Rakes, MD  Nutritional Supplements (,FEEDING SUPPLEMENT, PROSOURCE PLUS) liquid Take 30 mLs by mouth 2 (two) times daily between meals. 07/17/22    Shalhoub, Sherryll Burger, MD  ondansetron (ZOFRAN) 8 MG tablet Take 1 tablet (8 mg total) by mouth 2 (two) times daily as needed. Start on day 3 after chemotherapy. 07/20/22   Lincoln Brigham, PA-C  Oxycodone HCl 10 MG TABS Take 1 tablet (10 mg total) by mouth every 6 (six) hours as needed. 08/11/22   Pickenpack-Cousar, Carlena Sax, NP  Oxycodone HCl 10 MG TABS Take 1 tablet (10 mg total) by mouth every 6 (six) hours as needed for pain. 08/17/22     pantoprazole (PROTONIX) 40 MG tablet Take 1 tablet by mouth daily. 05/07/22   Charlott Rakes, MD  polyethylene glycol (MIRALAX / GLYCOLAX) 17 g packet Take 17 g by mouth daily as needed for mild constipation. 07/17/22   Shalhoub, Sherryll Burger, MD  pregabalin (LYRICA) 75 MG capsule Take 1 capsule (75 mg total) by mouth daily. 07/20/22   [name], NP  rosuvastatin (CRESTOR) 10 MG tablet Take 1 tablet (10 mg total) by mouth daily. 07/18/22   Shalhoub, Sherryll Burger, MD  thiamine (VITAMIN B-1) 100 MG tablet Take 1 tablet (100 mg total) by mouth daily. 07/18/22   Shalhoub, Sherryll Burger, MD  zolpidem (AMBIEN) 10 MG tablet Take 1 tablet (10 mg total) by mouth at bedtime as needed for sleep. 07/27/22 09/25/22  [name], NP  zolpidem (AMBIEN) 10 MG tablet Take 1 tablet (10 mg total) by mouth at bedtime as needed for sleep. 08/17/22       Allergies Bee venom and Lactose intolerance (gi)   REVIEW OF SYSTEMS  Negative except as noted here or in the History of Present Illness.   PHYSICAL EXAMINATION  Initial Vital Signs Blood pressure 116/83, pulse (!) 110, temperature (!) 96.7 F (35.9 C), temperature source Axillary, resp. rate 14, SpO2 97 %.  Examination General: Well-developed, cachectic male in no acute distress; appears older than age of record HENT: normocephalic; atraumatic Eyes: No scleral icterus Neck: supple Heart: regular rate and rhythm Lungs: clear to auscultation bilaterally Abdomen: soft; nondistended; right upper upper quadrant abdominal  fullness and tenderness; bowel sounds present Extremities: No deformity; full range of motion; pulses normal Neurologic: Awake, alert and oriented; motor function intact in all extremities and symmetric; no facial droop Skin: Warm and dry Psychiatric: Normal mood and affect   RESULTS  Summary of this visit's results, reviewed  and interpreted by myself:   EKG Interpretation  Date/Time:    Ventricular Rate:    PR Interval:    QRS Duration:   QT Interval:    QTC Calculation:   R Axis:     Text Interpretation:         Laboratory Studies: No results found. However, due to the size of the patient record, not all encounters were searched. Please check Results Review for a complete set of results. Imaging Studies: No results found.  ED COURSE and MDM  Nursing notes, initial and subsequent vitals signs, including pulse oximetry, reviewed and interpreted by myself.  Vitals:   08/24/22 0551  BP: 116/83  Pulse: (!) 110  Resp: 14  Temp: (!) 96.7 F (35.9 C)  TempSrc: Axillary  SpO2: 97%   Medications  feeding supplement (ENSURE ENLIVE / ENSURE PLUS) liquid 237 mL (has no administration in time range)   The patient does not have any apparent injury from his fall.  I do not see any indication for emergent admission at this time.  He does have home hospice care available.  He would like to pursue 24-hour care and we will consult social work for this.  PROCEDURES  Procedures   ED DIAGNOSES     ICD-10-CM   1. Fall at home, initial encounter  W19.XXXA    Y92.009     2. Pancreatic cancer metastasized to liver [facility])  C25.9    C78.7          Serah Nicoletti, MD 08/24/22 [id number]    Shanon Rosser, MD 08/24/22 [phone]

## 2022-08-24 NOTE — ED Provider Notes (Signed)
  Physical Exam  BP 132/87   Pulse (!) 112   Temp (!) 97.5 F (36.4 C) (Oral)   Resp 13   SpO2 98%   Physical Exam  Procedures  Procedures  ED Course / MDM     Received care of patient from Dr. Florina Ou.  Please see his note for prior history, physical and care.  Briefly this is a 61 year old male with a history of pancreatic cancer with liver metastases who presented with concern for falls, generalized weakness and chronic cancer pain.  Initial plan was for skilled nursing facility placement given his inability to care for himself.  Evaluated patient, who is having significant cancer related pain, nausea, "feels terrible."  Confirmed with him that he desires comfort care at this time, does not want to have further medical workup regarding his symptoms.  Discussed with his sister, who notes that he told her that he thought "his time was coming soon."  She agrees with full comfort care. She has been speaking to hospice.  I do feel he would benefit from [facility] or similar inpatient hospice care for IV therapies for comfort at this time.   Discussed with hospice/placed TOC consult and placed comfort orders.        [name] Morgan, MD 08/24/22 [phone]

## 2022-08-24 NOTE — Discharge Summary (Signed)
Physician Discharge Summary   Patient: [name] [name] MRN: [id number] DOB: Mar 28, 1961  Admit date:     08/24/2022  Discharge date: 08/24/22  Discharge Physician: Reubin Milan   PCP: [name], NP   Recommendations at discharge:   Continue home meds.  Discharge Diagnoses: Assessment and Plan: Principal Problem:   Fall, initial encounter Failure to thrive Observation/MedSurg. Unable to take care for himself at home. Referred to residential hospice.   Active Problems:   Portal vein thrombosis On apixaban.     Type 2 diabetes mellitus with diabetic neuropathy, unspecified (HCC) Carbohydrate modified diet. Continue Semglee 10 units SQ. CBG monitoring before meals and bedtime.     GERD without esophagitis Continue pantoprazole 40 mg p.o. daily.     Major depression, recurrent, chronic (HCC) Continue mirtazapine 30 mg p.o. daily.     Diabetic neuropathy (HCC) Continue pregabalin 75 mg p.o. daily.     Pancreatic cancer ([place]) Continue Duragesic patch and oxycodone as needed.  Chief Complaint  Patient presents with   Fall    HPI: [name] [name] is a 61 y.o. 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  No injuries reported. He denied fever, chills, rhinorrhea, sore throat, wheezing or hemoptysis.  No chest pain, palpitations, diaphoresis, PND, orthopnea or pitting edema of the lower extremities.  Positive chronic abdominal pain, nausea, but no emesis, diarrhea, constipation, melena or hematochezia.  No flank pain, dysuria, frequency or hematuria.  No polyuria, polydipsia, polyphagia or blurred vision.    ED course: Initial vital signs were temperature  96.7, pulse 110, respiration 14, BP 116/83 mmHg O2 sat 97% on 2 LPM.  The patient received hydromorphone 1 mg IVP and ondansetron 4 mg IVP.  See H&P for recent physical examination.  Consultants: None Procedures performed: None Disposition: Hospice care Diet recommendation:  Carb modified diet  DISCHARGE MEDICATION: Allergies as of 08/24/2022       Reactions   Bee Venom Anaphylaxis   Lactose Intolerance (gi) Other (See Comments)   GI upset       (CAMPRAL) 333 MG tablet Take 2 tablets (666 mg total) by mouth 3 (three) times daily with meals. 07/17/22     Shalhoub, Sherryll Burger, MD  acetaminophen (TYLENOL) 500 MG tablet Take 1 tablet by mouth every 6 hours as needed for moderate pain. Patient taking differently: Take 1,500 mg by mouth 2 (two) times daily as needed for moderate pain. 03/23/22     [name], NP  albuterol (VENTOLIN HFA) 108 (90 Base) MCG/ACT inhaler Inhale 2 puffs into the lungs every 6 hours as needed for wheezing or shortness of breath. 07/20/22     Lincoln Brigham, PA-C  alum & mag hydroxide-simeth (MAALOX/MYLANTA) 200-200-20 MG/5ML suspension Take 30 mLs by mouth every 4 (four) hours as needed for indigestion. 07/17/22     Shalhoub, Sherryll Burger, MD  apixaban (ELIQUIS) 5 MG TABS tablet Take 2 tablets (10 mg total) by mouth 2 (two) times daily for 3 doses. 07/17/22 07/19/22   Shalhoub, Sherryll Burger, MD  apixaban (ELIQUIS) 5 MG TABS tablet Take 1 tablet (5 mg total) by mouth 2 (two) times daily. To be taken beginning 07/19/2022 07/19/22     Shalhoub,  Sherryll Burger, MD  aspirin EC 81 MG tablet Take 1 tablet by mouth daily. 06/30/22     Charlott Rakes, MD  benzonatate (TESSALON) 100 MG capsule Take 1 capsule (100 mg total) by mouth 3 (three) times daily as needed for cough. 07/17/22     Shalhoub, Sherryll Burger, MD  bisacodyl (DULCOLAX) 5 MG EC tablet Take 1 tablet (5 mg total) by mouth daily as needed for moderate constipation. 07/17/22     Shalhoub, Sherryll Burger, MD  diphenoxylate-atropine  (LOMOTIL) 2.5-0.025 MG tablet Take 1 tablet by mouth 4 times daily as needed for diarrhea or loose stools. 06/30/22     [name], NP  EPINEPHrine 0.3 mg/0.3 mL IJ SOAJ injection Inject 0.3 mg into the muscle once as needed for anaphylaxis. 05/07/22     [name], NP  escitalopram (LEXAPRO) 10 MG tablet Take 1 tablet (10 mg total) by mouth daily. 05/06/22     [name], NP  feeding supplement (ENSURE ENLIVE / ENSURE PLUS) LIQD Take 237 mLs by mouth 3 (three) times daily between meals. 07/17/22     Shalhoub, Sherryll Burger, MD  fentaNYL (DURAGESIC) 12 MCG/HR Place 1 patch onto the skin every 3 (three) days. 08/11/22     Pickenpack-Cousar, Carlena Sax, NP  folic acid (FOLVITE) 1 MG tablet Take 1 tablet (1 mg total) by mouth in the morning. 03/28/21     Rai, Ripudeep K, MD  insulin aspart (NOVOLOG) 100 UNIT/ML injection Inject 0-6 Units into the skin 3 (three) times daily with meals. 07/17/22     Shalhoub, Sherryll Burger, MD  insulin aspart (NOVOLOG) 100 UNIT/ML injection Inject 0-5 Units into the skin at bedtime. 07/17/22     Shalhoub, Sherryll Burger, MD  insulin glargine-yfgn (SEMGLEE) 100 UNIT/ML injection Inject 0.1 mLs (10 Units total) into the skin at bedtime. 07/17/22     Shalhoub, Sherryll Burger, MD  lipase/protease/amylase (CREON) 36000 UNITS CPEP capsule Take 1 capsule (36,000 Units total) by mouth 3 (three) times daily with meals. May take 1 additional capsule with snacks Patient taking differently: Take 72,000 Units by mouth 3 (three) times daily with meals. 03/30/22     [name], NP  mirtazapine (REMERON) 30 MG tablet Take 1 tablet (30 mg total) by mouth at bedtime. 07/20/22     [name], NP  Multiple Vitamins-Minerals (THEREMS-M) TABS Take 1 tablet by mouth in the morning. 07/20/22     Charlott Rakes, MD  Nutritional Supplements (,FEEDING SUPPLEMENT, PROSOURCE PLUS) liquid Take 30 mLs by mouth 2 (two) times daily between meals. 07/17/22     Shalhoub, Sherryll Burger, MD  ondansetron (ZOFRAN) 8  MG tablet Take 1 tablet (8 mg total) by mouth 2 (two) times daily as needed. Start on day 3 after chemotherapy. 07/20/22     Lincoln Brigham, PA-C  Oxycodone HCl 10 MG TABS Take 1 tablet (10 mg total) by mouth every 6 (six) hours as needed. 08/11/22     Pickenpack-Cousar, Carlena Sax, NP  Oxycodone HCl 10 MG TABS Take 1 tablet (10 mg total) by mouth every 6 (six) hours as needed for pain. 08/17/22        pantoprazole (PROTONIX) 40 MG tablet Take 1 tablet by mouth daily. 05/07/22     Charlott Rakes, MD  polyethylene glycol (MIRALAX / GLYCOLAX) 17 g packet Take 17 g by mouth daily as needed for mild constipation. 07/17/22     Shalhoub, Sherryll Burger, MD  pregabalin (LYRICA) 75 MG capsule  Take 1 capsule (75 mg total) by mouth daily. 07/20/22     [name], NP  rosuvastatin (CRESTOR) 10 MG tablet Take 1 tablet (10 mg total) by mouth daily. 07/18/22     Shalhoub, Sherryll Burger, MD  thiamine (VITAMIN B-1) 100 MG tablet Take 1 tablet (100 mg total) by mouth daily. 07/18/22     Shalhoub, Sherryll Burger, MD  zolpidem (AMBIEN) 10 MG tablet Take 1 tablet (10 mg total) by mouth at bedtime as needed for sleep. 07/27/22 09/25/22   [name], NP  zolpidem (AMBIEN) 10 MG tablet Take 1 tablet (10 mg total) by mouth at bedtime as needed for sleep. 08/17/22           Discharge Exam: There were no vitals filed for this visit.   Condition at discharge: fair  The results of significant diagnostics from this hospitalization (including imaging, microbiology, ancillary and laboratory) are listed below for reference.   Imaging Studies: No results found.  Microbiology: Results for orders placed or performed during the hospital encounter of 07/07/22  Gastrointestinal Panel by PCR , Stool     Status: None   Collection Time: 07/08/22 10:28 PM   Specimen: Stool  Result Value Ref Range Status   Campylobacter species NOT DETECTED NOT DETECTED Final   Plesimonas shigelloides NOT DETECTED NOT DETECTED Final   Salmonella  species NOT DETECTED NOT DETECTED Final   Yersinia enterocolitica NOT DETECTED NOT DETECTED Final   Vibrio species NOT DETECTED NOT DETECTED Final   Vibrio cholerae NOT DETECTED NOT DETECTED Final   Enteroaggregative E coli (EAEC) NOT DETECTED NOT DETECTED Final   Enteropathogenic E coli (EPEC) NOT DETECTED NOT DETECTED Final   Enterotoxigenic E coli (ETEC) NOT DETECTED NOT DETECTED Final   Shiga like toxin producing E coli (STEC) NOT DETECTED NOT DETECTED Final   Shigella/Enteroinvasive E coli (EIEC) NOT DETECTED NOT DETECTED Final   Cryptosporidium NOT DETECTED NOT DETECTED Final   Cyclospora cayetanensis NOT DETECTED NOT DETECTED Final   Entamoeba histolytica NOT DETECTED NOT DETECTED Final   Giardia lamblia NOT DETECTED NOT DETECTED Final   Adenovirus F40/41 NOT DETECTED NOT DETECTED Final   Astrovirus NOT DETECTED NOT DETECTED Final   Norovirus GI/GII NOT DETECTED NOT DETECTED Final   Rotavirus A NOT DETECTED NOT DETECTED Final   Sapovirus (I, II, IV, and V) NOT DETECTED NOT DETECTED Final    Comment: Performed at [facility], [address] [place], [place] [address]  C Difficile Quick Screen w PCR reflex     Status: None   Collection Time: 07/08/22 10:28 PM   Specimen: Stool  Result Value Ref Range Status   C Diff antigen NEGATIVE NEGATIVE Final   C Diff toxin NEGATIVE NEGATIVE Final   C Diff interpretation No C. difficile detected.  Final    Comment: Performed at [facility], [place] [address] [place], [place] [address]  Urine Culture     Status: None   Collection Time: 07/15/22 10:35 PM   Specimen: Urine, Clean Catch  Result Value Ref Range Status   Specimen Description   Final    URINE, CLEAN CATCH Performed at [facility], [place] [address] [place], [place] [address]    Special Requests   Final    NONE Performed at [facility], [place] [address], [place], [place] [address]    Culture   Final    NO  GROWTH Performed at [facility], [place]  [address] [place], [place] [address]    Report Status 07/17/2022 FINAL  Final   *Note: Due to a large number of results and/or encounters for the requested time period, some results have not been displayed. A complete set of results can be found in Results Review.    Labs: CBC: No results for input(s): "WBC", "NEUTROABS", "HGB", "HCT", "MCV", "PLT" in the last 168 hours. Basic Metabolic Panel: No results for input(s): "NA", "K", "CL", "CO2", "GLUCOSE", "BUN", "CREATININE", "CALCIUM", "MG", "PHOS" in the last 168 hours. Liver Function Tests: No results for input(s): "AST", "ALT", "ALKPHOS", "BILITOT", "PROT", "ALBUMIN" in the last 168 hours. CBG: No results for input(s): "GLUCAP" in the last 168 hours.  Discharge time spent: less than 30 minutes.  Signed: Reubin Milan, MD [facility] 08/24/2022

## 2022-08-31 DEATH — deceased

## 2022-09-09 ENCOUNTER — Ambulatory Visit: Payer: Medicaid Other | Admitting: Hematology and Oncology

## 2022-09-09 ENCOUNTER — Other Ambulatory Visit: Payer: Medicaid Other

## 2022-11-04 ENCOUNTER — Ambulatory Visit: Payer: Medicaid Other | Admitting: Nurse Practitioner

## 2023-11-09 IMAGING — CT CT ABD-PELV W/ CM
2 of 5 series · 15 of 46 positions shown, 17 images · IV contrast (agent unspecified)
Comparison: CT abdomen pelvis 07/15/2021

CLINICAL DATA: Intra-abdominal abscess?

Recent [place]ipple
EXAM:
CT ABDOMEN AND PELVIS WITH CONTRAST
TECHNIQUE: Multidetector CT imaging of the abdomen and pelvis was performed
using the standard protocol following bolus administration of
intravenous contrast.

[Series 2: axial st · axial · 0.83mm/px · z∈[+1076,+1486]mm · 12 of 94 slices shown, 14 images]
[im 6/94  soft-tissue]
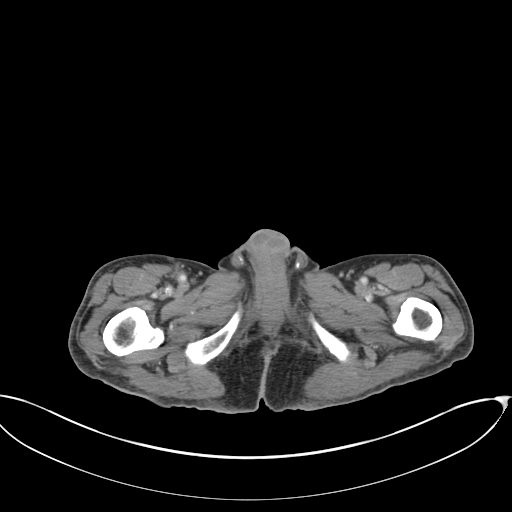
[im 6/94  bone]
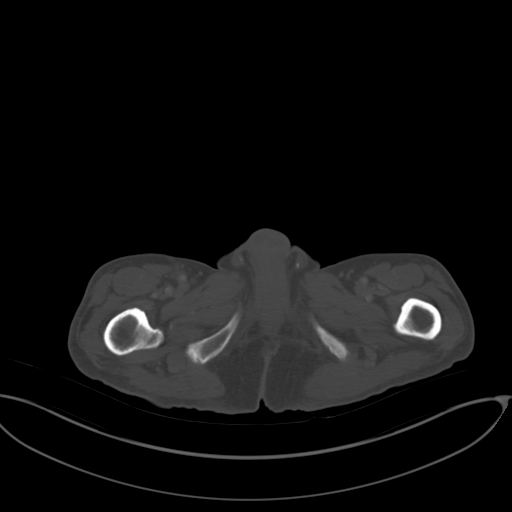
[im 12/94  soft-tissue]
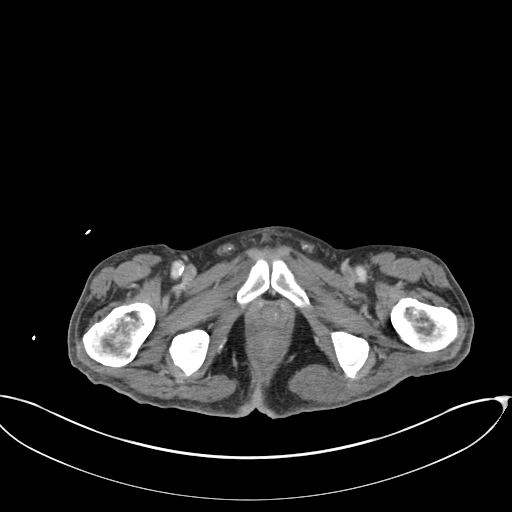
[im 24/94  soft-tissue]
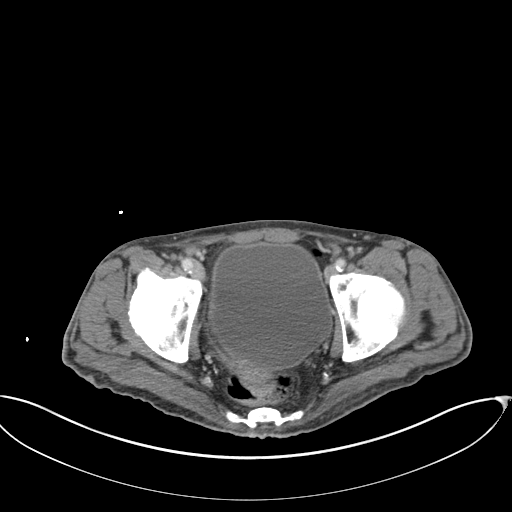
[im 30/94  soft-tissue]
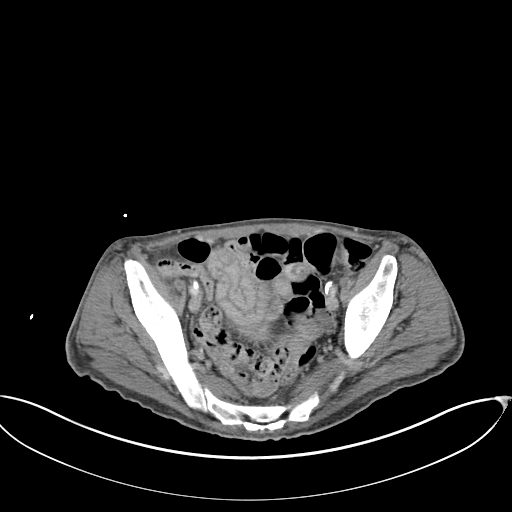
[im 35/94  soft-tissue]
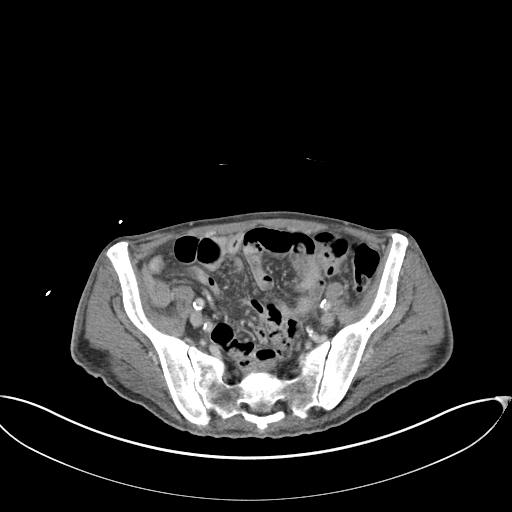
[im 41/94  soft-tissue]
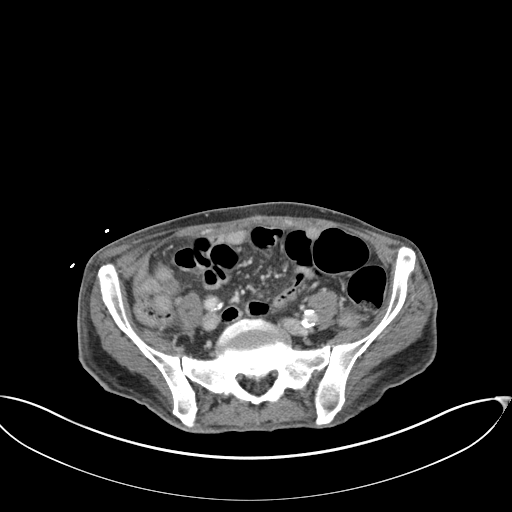
[im 53/94  soft-tissue]
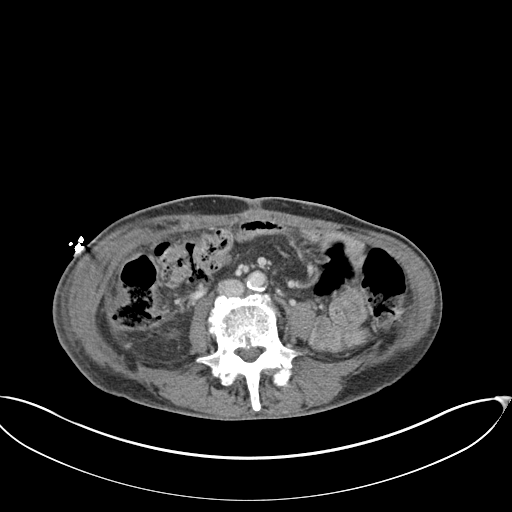
[im 59/94  soft-tissue]
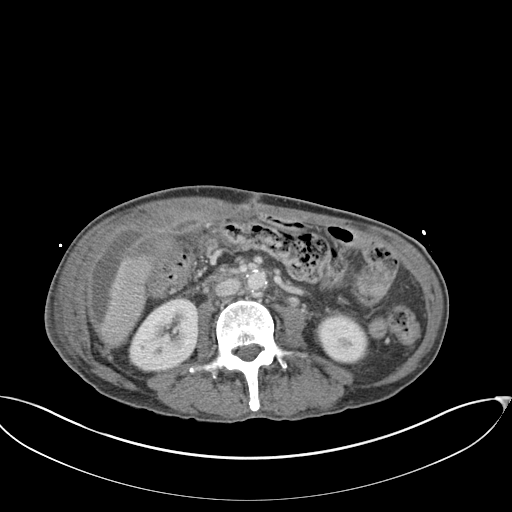
[im 64/94  soft-tissue]
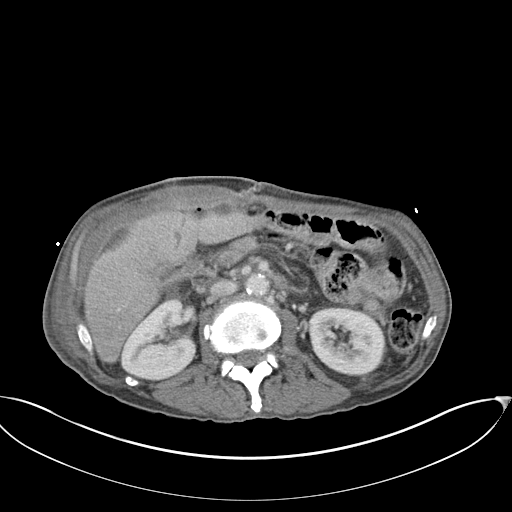
[im 64/94  bone]
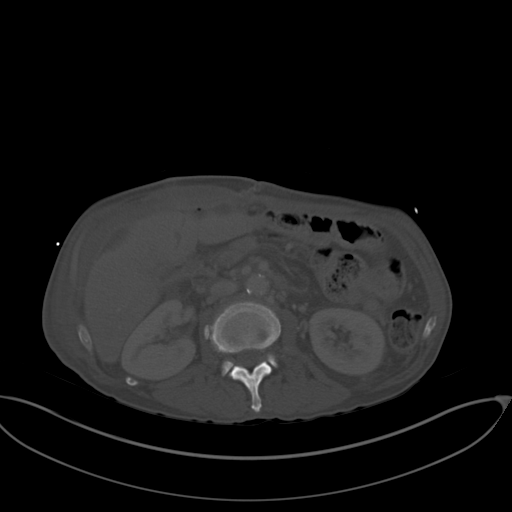
[im 70/94  soft-tissue]
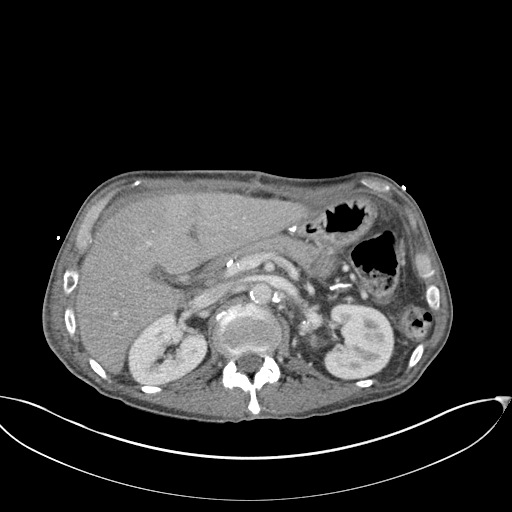
[im 82/94  soft-tissue]
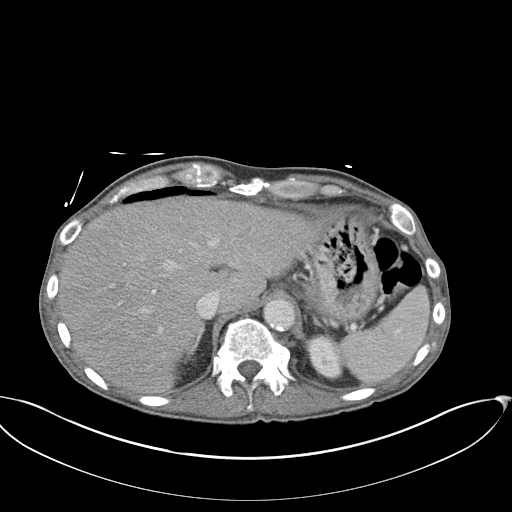
[im 88/94  soft-tissue]
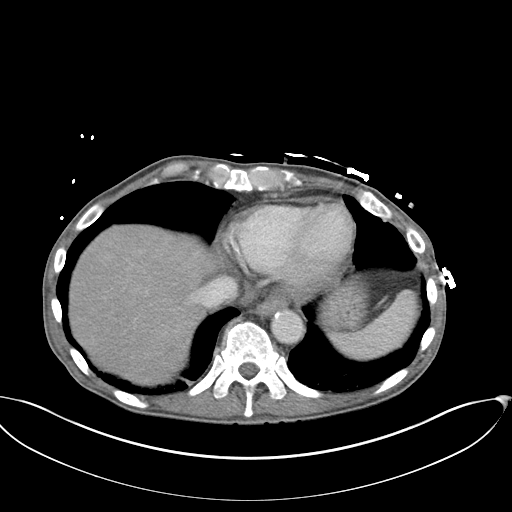

[Series 5: coronal st · coronal · 0.73mm/px · 3 of 138 slices shown]
[im 46/138  soft-tissue]
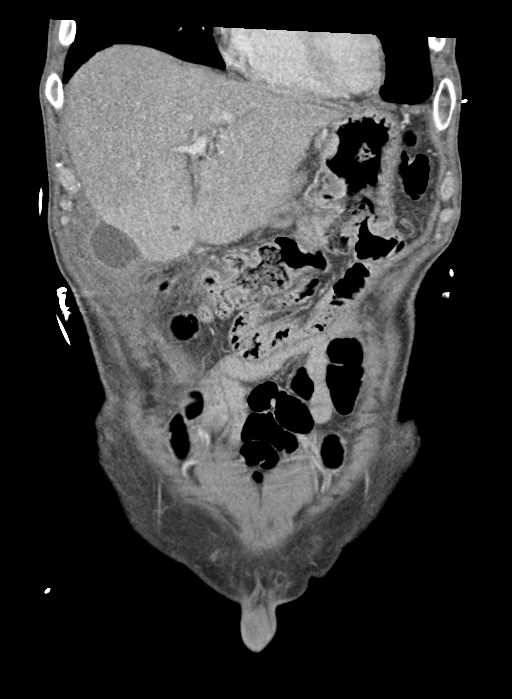
[im 61/138  soft-tissue]
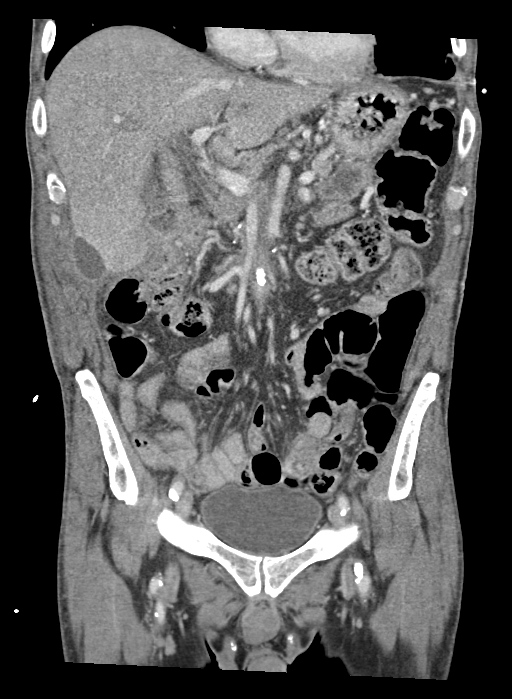
[im 77/138  soft-tissue]
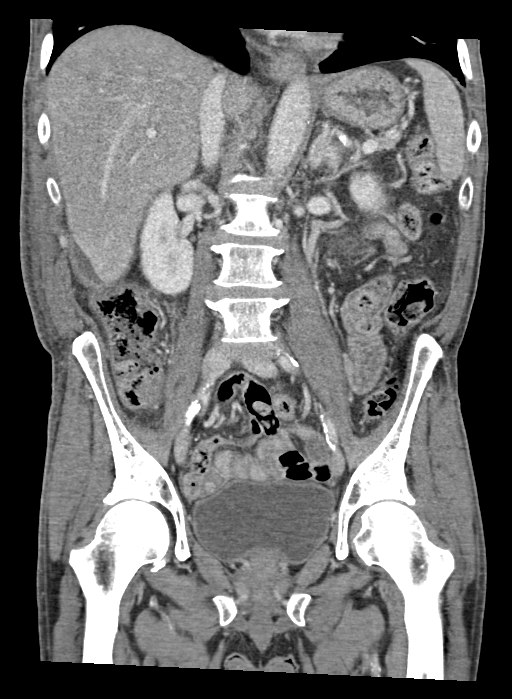

[15 of 46 positions shown; findings below may reference images not displayed]

RADIATION DOSE REDUCTION: This exam was performed according to the
departmental dose-optimization program which includes automated
exposure control, adjustment of the mA and/or kV according to
patient size and/or use of iterative reconstruction technique.

CONTRAST:  100mL OMNIPAQUE IOHEXOL 300 MG/ML  SOLN
FINDINGS: Lower chest: No acute abnormality.

Hepatobiliary: No focal liver abnormality is seen. Status post
cholecystectomy. No biliary dilatation.

Pancreas: Interval resection of previously seen pancreatic head
mass. No significant residual dilatation of the pancreatic duct
within the residual pancreatic tail and body. Interval removal of
metallic biliary stent.

Spleen: Normal in size without focal abnormality.

Adrenals/Urinary Tract: Adrenal glands are unremarkable. Kidneys are
normal, without renal calculi, focal lesion, or hydronephrosis.
Bladder is unremarkable.

Stomach/Bowel: Postsurgical changes of Whipple are seen. No bowel
dilatation to indicate ileus or obstruction. Appendix is not
definitely identified.

Vascular/Lymphatic: Atherosclerotic calcifications seen scattered
throughout the abdominal aorta and visualized branches. No enlarged
abdominal or pelvic lymph nodes.

Reproductive: Prostate is unremarkable.

Other: No significant normality of the abdominal wall. There is a
lenticular fluid collection adjacent to the inferior tip of the
right hepatic lobe measuring 7.6 x 2.5 x 2.2 cm, consistent with
abscess given its enhancing rim. Is slightly smaller collection
located closer to the midline of the abdomen, adjacent to the
inferior tip of the left hepatic lobe measures 4.5 x 1.3 x 3.0 cm
which is also abscess. These 2 collections appear to be connecting
through a small channel.

Musculoskeletal: No acute or significant osseous findings.
IMPRESSION: 1. Two perihepatic fluid collections measuring 7.6 x 2.5 x 2.2 cm
and 4.5 x 1.3 x 3.0 cm are consistent with abscesses. These
collections appear to be communicating through a narrow channel.
2. Postsurgical changes of Whipple. No evidence of bowel
obstruction.
